# Patient Record
Sex: Female | Born: 1940
Health system: Southern US, Community
[De-identification: ages and names within clinical notes are randomized; demographics above are authoritative.]

## PROBLEM LIST (undated history)

## (undated) DIAGNOSIS — J984 Other disorders of lung: Secondary | ICD-10-CM

## (undated) DIAGNOSIS — H269 Unspecified cataract: Secondary | ICD-10-CM

## (undated) DIAGNOSIS — Z170 Estrogen receptor positive status [ER+]: Principal | ICD-10-CM

## (undated) DIAGNOSIS — C50919 Malignant neoplasm of unspecified site of unspecified female breast: Secondary | ICD-10-CM

## (undated) DIAGNOSIS — C569 Malignant neoplasm of unspecified ovary: Secondary | ICD-10-CM

## (undated) DIAGNOSIS — M1990 Unspecified osteoarthritis, unspecified site: Secondary | ICD-10-CM

## (undated) DIAGNOSIS — J449 Chronic obstructive pulmonary disease, unspecified: Secondary | ICD-10-CM

## (undated) DIAGNOSIS — I7121 Aneurysm of the ascending aorta, without rupture: Secondary | ICD-10-CM

## (undated) DIAGNOSIS — R002 Palpitations: Secondary | ICD-10-CM

## (undated) DIAGNOSIS — R0600 Dyspnea, unspecified: Secondary | ICD-10-CM

## (undated) DIAGNOSIS — I719 Aortic aneurysm of unspecified site, without rupture: Secondary | ICD-10-CM

## (undated) DIAGNOSIS — N644 Mastodynia: Secondary | ICD-10-CM

## (undated) DIAGNOSIS — I10 Essential (primary) hypertension: Secondary | ICD-10-CM

## (undated) DIAGNOSIS — I2510 Atherosclerotic heart disease of native coronary artery without angina pectoris: Secondary | ICD-10-CM

## (undated) DIAGNOSIS — I340 Nonrheumatic mitral (valve) insufficiency: Secondary | ICD-10-CM

## (undated) DIAGNOSIS — G20 Parkinson's disease: Secondary | ICD-10-CM

## (undated) DIAGNOSIS — G8912 Acute post-thoracotomy pain: Secondary | ICD-10-CM

## (undated) DIAGNOSIS — J45909 Unspecified asthma, uncomplicated: Secondary | ICD-10-CM

## (undated) DIAGNOSIS — M810 Age-related osteoporosis without current pathological fracture: Secondary | ICD-10-CM

## (undated) DIAGNOSIS — I509 Heart failure, unspecified: Secondary | ICD-10-CM

## (undated) DIAGNOSIS — K219 Gastro-esophageal reflux disease without esophagitis: Secondary | ICD-10-CM

## (undated) DIAGNOSIS — J151 Pneumonia due to Pseudomonas: Secondary | ICD-10-CM

## (undated) DIAGNOSIS — I5032 Chronic diastolic (congestive) heart failure: Secondary | ICD-10-CM

## (undated) DIAGNOSIS — D259 Leiomyoma of uterus, unspecified: Secondary | ICD-10-CM

## (undated) DIAGNOSIS — Z9889 Other specified postprocedural states: Secondary | ICD-10-CM

## (undated) DIAGNOSIS — N649 Disorder of breast, unspecified: Secondary | ICD-10-CM

## (undated) DIAGNOSIS — E559 Vitamin D deficiency, unspecified: Secondary | ICD-10-CM

## (undated) DIAGNOSIS — I209 Angina pectoris, unspecified: Secondary | ICD-10-CM

## (undated) DIAGNOSIS — K859 Acute pancreatitis without necrosis or infection, unspecified: Secondary | ICD-10-CM

## (undated) HISTORY — DX: Malignant neoplasm of unspecified site of unspecified female breast: C50.919

## (undated) HISTORY — DX: Acute post-thoracotomy pain: G89.12

## (undated) HISTORY — DX: Palpitations: R00.2

## (undated) HISTORY — DX: Disorder of breast, unspecified: N64.9

## (undated) HISTORY — DX: Chronic obstructive pulmonary disease, unspecified: J44.9

## (undated) HISTORY — DX: Malignant neoplasm of unspecified ovary: C56.9

## (undated) HISTORY — DX: Unspecified cataract: H26.9

## (undated) HISTORY — DX: Pneumonia due to Pseudomonas: J15.1

## (undated) HISTORY — DX: Unspecified osteoarthritis, unspecified site: M19.90

## (undated) HISTORY — DX: Age-related osteoporosis without current pathological fracture: M81.0

## (undated) HISTORY — DX: Other disorders of lung: J98.4

## (undated) HISTORY — DX: Mastodynia: N64.4

## (undated) HISTORY — DX: Nonrheumatic mitral (valve) insufficiency: I34.0

## (undated) HISTORY — DX: Aortic aneurysm of unspecified site, without rupture: I71.9

## (undated) HISTORY — DX: Leiomyoma of uterus, unspecified: D25.9

## (undated) HISTORY — DX: Acute pancreatitis without necrosis or infection, unspecified: K85.90

## (undated) HISTORY — DX: Gastro-esophageal reflux disease without esophagitis: K21.9

## (undated) HISTORY — DX: Estrogen receptor positive status (ER+): Z17.0

## (undated) HISTORY — DX: Heart failure, unspecified: I50.9

## (undated) HISTORY — DX: Unspecified asthma, uncomplicated: J45.909

## (undated) HISTORY — DX: Aneurysm of the ascending aorta, without rupture: I71.21

## (undated) HISTORY — PX: ABDOMINAL HYSTERECTOMY: SHX81

## (undated) HISTORY — DX: Vitamin D deficiency, unspecified: E55.9

## (undated) HISTORY — DX: Essential (primary) hypertension: I10

## (undated) HISTORY — DX: Parkinson's disease: G20

## (undated) HISTORY — DX: Hypercalcemia: E83.52

## (undated) HISTORY — DX: Chronic diastolic (congestive) heart failure: I50.32

---
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
## 1990-11-26 DIAGNOSIS — N649 Disorder of breast, unspecified: Secondary | ICD-10-CM

## 1990-11-26 HISTORY — PX: BREAST LUMPECTOMY: SHX2

## 1990-11-26 HISTORY — DX: Disorder of breast, unspecified: N64.9

## 1994-05-26 DIAGNOSIS — N644 Mastodynia: Secondary | ICD-10-CM

## 1994-05-26 HISTORY — DX: Mastodynia: N64.4

## 1998-12-29 ENCOUNTER — Other Ambulatory Visit: Admission: RE | Admit: 1998-12-29 | Discharge: 1998-12-29 | Payer: Self-pay | Admitting: Obstetrics and Gynecology

## 1999-10-11 ENCOUNTER — Encounter: Admission: RE | Admit: 1999-10-11 | Discharge: 1999-10-11 | Payer: Self-pay | Admitting: Internal Medicine

## 1999-10-11 ENCOUNTER — Encounter: Payer: Self-pay | Admitting: Internal Medicine

## 1999-11-27 HISTORY — PX: CHOLECYSTECTOMY: SHX55

## 2000-01-02 ENCOUNTER — Other Ambulatory Visit: Admission: RE | Admit: 2000-01-02 | Discharge: 2000-01-02 | Payer: Self-pay | Admitting: Obstetrics and Gynecology

## 2000-04-03 ENCOUNTER — Other Ambulatory Visit: Admission: RE | Admit: 2000-04-03 | Discharge: 2000-04-03 | Payer: Self-pay | Admitting: Urology

## 2000-05-31 ENCOUNTER — Other Ambulatory Visit: Admission: RE | Admit: 2000-05-31 | Discharge: 2000-05-31 | Payer: Self-pay | Admitting: Obstetrics and Gynecology

## 2000-06-27 ENCOUNTER — Encounter: Admission: RE | Admit: 2000-06-27 | Discharge: 2000-06-27 | Payer: Self-pay | Admitting: Obstetrics and Gynecology

## 2000-06-27 ENCOUNTER — Encounter: Payer: Self-pay | Admitting: Obstetrics and Gynecology

## 2000-10-16 ENCOUNTER — Encounter: Payer: Self-pay | Admitting: Emergency Medicine

## 2000-10-16 ENCOUNTER — Encounter (INDEPENDENT_AMBULATORY_CARE_PROVIDER_SITE_OTHER): Payer: Self-pay | Admitting: Specialist

## 2000-10-16 ENCOUNTER — Inpatient Hospital Stay (HOSPITAL_COMMUNITY): Admission: EM | Admit: 2000-10-16 | Discharge: 2000-10-19 | Payer: Self-pay | Admitting: Emergency Medicine

## 2000-10-18 ENCOUNTER — Encounter: Payer: Self-pay | Admitting: General Surgery

## 2000-11-26 DIAGNOSIS — C50919 Malignant neoplasm of unspecified site of unspecified female breast: Secondary | ICD-10-CM

## 2000-11-26 HISTORY — PX: MASTECTOMY MODIFIED RADICAL: SUR848

## 2000-11-26 HISTORY — DX: Malignant neoplasm of unspecified site of unspecified female breast: C50.919

## 2000-11-26 HISTORY — PX: LUNG BIOPSY: SHX232

## 2000-12-06 ENCOUNTER — Encounter: Payer: Self-pay | Admitting: General Surgery

## 2000-12-09 ENCOUNTER — Encounter (INDEPENDENT_AMBULATORY_CARE_PROVIDER_SITE_OTHER): Payer: Self-pay | Admitting: *Deleted

## 2000-12-09 ENCOUNTER — Ambulatory Visit (HOSPITAL_COMMUNITY): Admission: RE | Admit: 2000-12-09 | Discharge: 2000-12-09 | Payer: Self-pay | Admitting: General Surgery

## 2000-12-24 ENCOUNTER — Encounter: Payer: Self-pay | Admitting: General Surgery

## 2000-12-24 ENCOUNTER — Encounter: Admission: RE | Admit: 2000-12-24 | Discharge: 2000-12-24 | Payer: Self-pay | Admitting: General Surgery

## 2001-01-02 ENCOUNTER — Encounter (INDEPENDENT_AMBULATORY_CARE_PROVIDER_SITE_OTHER): Payer: Self-pay | Admitting: *Deleted

## 2001-01-02 ENCOUNTER — Encounter: Payer: Self-pay | Admitting: General Surgery

## 2001-01-02 ENCOUNTER — Ambulatory Visit (HOSPITAL_COMMUNITY): Admission: RE | Admit: 2001-01-02 | Discharge: 2001-01-03 | Payer: Self-pay | Admitting: General Surgery

## 2001-01-10 ENCOUNTER — Ambulatory Visit (HOSPITAL_COMMUNITY): Admission: RE | Admit: 2001-01-10 | Discharge: 2001-01-10 | Payer: Self-pay | Admitting: Oncology

## 2001-01-10 ENCOUNTER — Encounter: Payer: Self-pay | Admitting: Oncology

## 2001-01-23 ENCOUNTER — Encounter (INDEPENDENT_AMBULATORY_CARE_PROVIDER_SITE_OTHER): Payer: Self-pay | Admitting: *Deleted

## 2001-01-23 ENCOUNTER — Inpatient Hospital Stay (HOSPITAL_COMMUNITY): Admission: RE | Admit: 2001-01-23 | Discharge: 2001-01-27 | Payer: Self-pay | Admitting: Thoracic Surgery

## 2001-01-23 ENCOUNTER — Encounter: Payer: Self-pay | Admitting: Thoracic Surgery

## 2001-01-24 ENCOUNTER — Encounter: Payer: Self-pay | Admitting: Thoracic Surgery

## 2001-01-25 ENCOUNTER — Encounter: Payer: Self-pay | Admitting: Thoracic Surgery

## 2001-01-26 ENCOUNTER — Encounter: Payer: Self-pay | Admitting: Thoracic Surgery

## 2001-02-04 ENCOUNTER — Encounter: Payer: Self-pay | Admitting: Thoracic Surgery

## 2001-02-04 ENCOUNTER — Encounter: Admission: RE | Admit: 2001-02-04 | Discharge: 2001-02-04 | Payer: Self-pay | Admitting: Thoracic Surgery

## 2001-02-10 ENCOUNTER — Encounter: Admission: RE | Admit: 2001-02-10 | Discharge: 2001-02-10 | Payer: Self-pay | Admitting: Internal Medicine

## 2001-02-17 ENCOUNTER — Encounter: Admission: RE | Admit: 2001-02-17 | Discharge: 2001-02-17 | Payer: Self-pay | Admitting: Internal Medicine

## 2001-02-19 ENCOUNTER — Encounter: Payer: Self-pay | Admitting: Thoracic Surgery

## 2001-02-19 ENCOUNTER — Encounter: Admission: RE | Admit: 2001-02-19 | Discharge: 2001-02-19 | Payer: Self-pay | Admitting: Thoracic Surgery

## 2001-03-04 ENCOUNTER — Encounter: Admission: RE | Admit: 2001-03-04 | Discharge: 2001-03-04 | Payer: Self-pay | Admitting: Internal Medicine

## 2001-03-19 ENCOUNTER — Encounter: Admission: RE | Admit: 2001-03-19 | Discharge: 2001-03-19 | Payer: Self-pay | Admitting: Thoracic Surgery

## 2001-03-19 ENCOUNTER — Encounter: Payer: Self-pay | Admitting: Thoracic Surgery

## 2001-03-25 ENCOUNTER — Other Ambulatory Visit: Admission: RE | Admit: 2001-03-25 | Discharge: 2001-03-25 | Payer: Self-pay | Admitting: General Surgery

## 2001-03-28 ENCOUNTER — Other Ambulatory Visit: Admission: RE | Admit: 2001-03-28 | Discharge: 2001-03-28 | Payer: Self-pay | Admitting: Obstetrics and Gynecology

## 2001-04-15 ENCOUNTER — Encounter: Admission: RE | Admit: 2001-04-15 | Discharge: 2001-04-15 | Payer: Self-pay | Admitting: Internal Medicine

## 2001-05-14 ENCOUNTER — Other Ambulatory Visit: Admission: RE | Admit: 2001-05-14 | Discharge: 2001-05-14 | Payer: Self-pay | Admitting: Urology

## 2001-05-20 ENCOUNTER — Encounter: Admission: RE | Admit: 2001-05-20 | Discharge: 2001-05-20 | Payer: Self-pay | Admitting: Thoracic Surgery

## 2001-05-20 ENCOUNTER — Encounter: Payer: Self-pay | Admitting: Thoracic Surgery

## 2001-06-19 ENCOUNTER — Encounter: Admission: RE | Admit: 2001-06-19 | Discharge: 2001-06-19 | Payer: Self-pay | Admitting: General Surgery

## 2001-06-19 ENCOUNTER — Encounter: Payer: Self-pay | Admitting: General Surgery

## 2001-08-27 ENCOUNTER — Encounter: Admission: RE | Admit: 2001-08-27 | Discharge: 2001-08-27 | Payer: Self-pay | Admitting: Thoracic Surgery

## 2001-08-27 ENCOUNTER — Encounter: Payer: Self-pay | Admitting: Thoracic Surgery

## 2001-09-16 ENCOUNTER — Encounter: Admission: RE | Admit: 2001-09-16 | Discharge: 2001-09-16 | Payer: Self-pay | Admitting: Obstetrics & Gynecology

## 2001-09-26 ENCOUNTER — Encounter: Payer: Self-pay | Admitting: Internal Medicine

## 2001-09-26 ENCOUNTER — Encounter: Admission: RE | Admit: 2001-09-26 | Discharge: 2001-09-26 | Payer: Self-pay | Admitting: Internal Medicine

## 2001-10-28 ENCOUNTER — Encounter: Admission: RE | Admit: 2001-10-28 | Discharge: 2001-10-28 | Payer: Self-pay | Admitting: Internal Medicine

## 2001-12-09 ENCOUNTER — Encounter: Admission: RE | Admit: 2001-12-09 | Discharge: 2001-12-09 | Payer: Self-pay | Admitting: Oncology

## 2001-12-09 ENCOUNTER — Encounter: Payer: Self-pay | Admitting: Oncology

## 2002-01-14 ENCOUNTER — Encounter: Payer: Self-pay | Admitting: Oncology

## 2002-01-14 ENCOUNTER — Encounter: Admission: RE | Admit: 2002-01-14 | Discharge: 2002-01-14 | Payer: Self-pay | Admitting: Oncology

## 2002-02-06 ENCOUNTER — Encounter: Admission: RE | Admit: 2002-02-06 | Discharge: 2002-02-06 | Payer: Self-pay | Admitting: Internal Medicine

## 2002-03-31 ENCOUNTER — Encounter: Admission: RE | Admit: 2002-03-31 | Discharge: 2002-03-31 | Payer: Self-pay | Admitting: Internal Medicine

## 2002-04-14 ENCOUNTER — Encounter: Payer: Self-pay | Admitting: Internal Medicine

## 2002-04-14 ENCOUNTER — Encounter: Admission: RE | Admit: 2002-04-14 | Discharge: 2002-04-14 | Payer: Self-pay | Admitting: Internal Medicine

## 2002-04-28 ENCOUNTER — Encounter: Payer: Self-pay | Admitting: Internal Medicine

## 2002-04-28 ENCOUNTER — Encounter: Admission: RE | Admit: 2002-04-28 | Discharge: 2002-04-28 | Payer: Self-pay | Admitting: *Deleted

## 2002-05-12 ENCOUNTER — Encounter: Admission: RE | Admit: 2002-05-12 | Discharge: 2002-05-12 | Payer: Self-pay | Admitting: Internal Medicine

## 2002-09-14 ENCOUNTER — Other Ambulatory Visit: Admission: RE | Admit: 2002-09-14 | Discharge: 2002-09-14 | Payer: Self-pay | Admitting: Obstetrics and Gynecology

## 2002-11-26 DIAGNOSIS — D3911 Neoplasm of uncertain behavior of right ovary: Secondary | ICD-10-CM

## 2002-11-26 HISTORY — PX: APPENDECTOMY: SHX54

## 2002-11-26 HISTORY — DX: Neoplasm of uncertain behavior of right ovary: D39.11

## 2002-12-10 ENCOUNTER — Encounter: Admission: RE | Admit: 2002-12-10 | Discharge: 2002-12-10 | Payer: Self-pay | Admitting: Oncology

## 2002-12-10 ENCOUNTER — Encounter: Payer: Self-pay | Admitting: Oncology

## 2003-01-04 ENCOUNTER — Encounter: Payer: Self-pay | Admitting: Oncology

## 2003-01-04 ENCOUNTER — Encounter: Admission: RE | Admit: 2003-01-04 | Discharge: 2003-01-04 | Payer: Self-pay | Admitting: Oncology

## 2003-04-14 ENCOUNTER — Encounter (INDEPENDENT_AMBULATORY_CARE_PROVIDER_SITE_OTHER): Payer: Self-pay | Admitting: *Deleted

## 2003-04-14 ENCOUNTER — Inpatient Hospital Stay (HOSPITAL_COMMUNITY): Admission: RE | Admit: 2003-04-14 | Discharge: 2003-04-16 | Payer: Self-pay | Admitting: Interventional Cardiology

## 2003-05-25 ENCOUNTER — Ambulatory Visit: Admission: RE | Admit: 2003-05-25 | Discharge: 2003-05-25 | Payer: Self-pay | Admitting: Gynecology

## 2003-07-05 ENCOUNTER — Encounter: Payer: Self-pay | Admitting: Oncology

## 2003-07-05 ENCOUNTER — Encounter: Admission: RE | Admit: 2003-07-05 | Discharge: 2003-07-05 | Payer: Self-pay | Admitting: Oncology

## 2003-07-12 ENCOUNTER — Encounter: Admission: RE | Admit: 2003-07-12 | Discharge: 2003-07-12 | Payer: Self-pay | Admitting: Internal Medicine

## 2003-07-27 ENCOUNTER — Encounter: Admission: RE | Admit: 2003-07-27 | Discharge: 2003-07-27 | Payer: Self-pay | Admitting: Internal Medicine

## 2003-08-13 ENCOUNTER — Encounter: Admission: RE | Admit: 2003-08-13 | Discharge: 2003-08-13 | Payer: Self-pay | Admitting: Internal Medicine

## 2003-10-05 ENCOUNTER — Encounter: Admission: RE | Admit: 2003-10-05 | Discharge: 2003-10-05 | Payer: Self-pay | Admitting: Internal Medicine

## 2003-12-02 ENCOUNTER — Ambulatory Visit (HOSPITAL_COMMUNITY): Admission: RE | Admit: 2003-12-02 | Discharge: 2003-12-02 | Payer: Self-pay | Admitting: Internal Medicine

## 2003-12-13 ENCOUNTER — Encounter: Admission: RE | Admit: 2003-12-13 | Discharge: 2003-12-13 | Payer: Self-pay | Admitting: Internal Medicine

## 2003-12-21 ENCOUNTER — Encounter: Admission: RE | Admit: 2003-12-21 | Discharge: 2003-12-21 | Payer: Self-pay | Admitting: Oncology

## 2004-02-15 ENCOUNTER — Encounter: Admission: RE | Admit: 2004-02-15 | Discharge: 2004-02-15 | Payer: Self-pay | Admitting: Internal Medicine

## 2004-02-22 ENCOUNTER — Ambulatory Visit (HOSPITAL_COMMUNITY): Admission: RE | Admit: 2004-02-22 | Discharge: 2004-02-22 | Payer: Self-pay | Admitting: Internal Medicine

## 2004-03-21 ENCOUNTER — Encounter: Admission: RE | Admit: 2004-03-21 | Discharge: 2004-03-21 | Payer: Self-pay | Admitting: Internal Medicine

## 2004-08-01 ENCOUNTER — Encounter: Admission: RE | Admit: 2004-08-01 | Discharge: 2004-08-01 | Payer: Self-pay | Admitting: General Surgery

## 2004-08-03 ENCOUNTER — Encounter (INDEPENDENT_AMBULATORY_CARE_PROVIDER_SITE_OTHER): Payer: Self-pay | Admitting: Specialist

## 2004-08-03 ENCOUNTER — Ambulatory Visit (HOSPITAL_BASED_OUTPATIENT_CLINIC_OR_DEPARTMENT_OTHER): Admission: RE | Admit: 2004-08-03 | Discharge: 2004-08-03 | Payer: Self-pay | Admitting: General Surgery

## 2004-08-03 ENCOUNTER — Ambulatory Visit (HOSPITAL_COMMUNITY): Admission: RE | Admit: 2004-08-03 | Discharge: 2004-08-03 | Payer: Self-pay | Admitting: General Surgery

## 2004-08-07 ENCOUNTER — Encounter: Admission: RE | Admit: 2004-08-07 | Discharge: 2004-08-07 | Payer: Self-pay | Admitting: Oncology

## 2004-08-26 HISTORY — PX: BREAST BIOPSY: SHX20

## 2004-12-04 ENCOUNTER — Ambulatory Visit: Payer: Self-pay | Admitting: Oncology

## 2004-12-27 ENCOUNTER — Encounter: Admission: RE | Admit: 2004-12-27 | Discharge: 2004-12-27 | Payer: Self-pay | Admitting: *Deleted

## 2005-02-05 ENCOUNTER — Ambulatory Visit: Payer: Self-pay | Admitting: Internal Medicine

## 2005-02-05 ENCOUNTER — Encounter: Admission: RE | Admit: 2005-02-05 | Discharge: 2005-02-05 | Payer: Self-pay | Admitting: Oncology

## 2005-02-21 ENCOUNTER — Ambulatory Visit: Payer: Self-pay | Admitting: Internal Medicine

## 2005-05-22 ENCOUNTER — Ambulatory Visit: Payer: Self-pay | Admitting: Internal Medicine

## 2005-06-01 ENCOUNTER — Ambulatory Visit: Payer: Self-pay | Admitting: Oncology

## 2005-06-20 ENCOUNTER — Encounter: Admission: RE | Admit: 2005-06-20 | Discharge: 2005-06-20 | Payer: Self-pay | Admitting: Oncology

## 2005-09-06 ENCOUNTER — Ambulatory Visit: Payer: Self-pay | Admitting: Internal Medicine

## 2005-10-24 ENCOUNTER — Ambulatory Visit: Payer: Self-pay | Admitting: Internal Medicine

## 2005-11-26 HISTORY — PX: CARDIAC CATHETERIZATION: SHX172

## 2005-12-07 ENCOUNTER — Ambulatory Visit: Payer: Self-pay | Admitting: Oncology

## 2005-12-25 ENCOUNTER — Encounter: Admission: RE | Admit: 2005-12-25 | Discharge: 2005-12-25 | Payer: Self-pay | Admitting: Oncology

## 2006-01-02 ENCOUNTER — Encounter: Admission: RE | Admit: 2006-01-02 | Discharge: 2006-01-02 | Payer: Self-pay | Admitting: Oncology

## 2006-05-22 ENCOUNTER — Other Ambulatory Visit: Admission: RE | Admit: 2006-05-22 | Discharge: 2006-05-22 | Payer: Self-pay | Admitting: Obstetrics & Gynecology

## 2006-05-27 ENCOUNTER — Ambulatory Visit: Payer: Self-pay | Admitting: Internal Medicine

## 2006-06-04 ENCOUNTER — Ambulatory Visit: Payer: Self-pay | Admitting: Oncology

## 2006-06-10 LAB — COMPREHENSIVE METABOLIC PANEL
ALT: 17 U/L (ref 0–40)
Albumin: 4.3 g/dL (ref 3.5–5.2)
Alkaline Phosphatase: 73 U/L (ref 39–117)
CO2: 25 mEq/L (ref 19–32)
Potassium: 4.2 mEq/L (ref 3.5–5.3)
Sodium: 138 mEq/L (ref 135–145)
Total Bilirubin: 0.9 mg/dL (ref 0.3–1.2)
Total Protein: 6.9 g/dL (ref 6.0–8.3)

## 2006-06-10 LAB — CBC WITH DIFFERENTIAL/PLATELET
BASO%: 0.3 % (ref 0.0–2.0)
EOS%: 3.4 % (ref 0.0–7.0)
HCT: 39.7 % (ref 34.8–46.6)
MCH: 30.3 pg (ref 26.0–34.0)
MCHC: 33.8 g/dL (ref 32.0–36.0)
MONO#: 0.6 10*3/uL (ref 0.1–0.9)
NEUT%: 61.7 % (ref 39.6–76.8)
RBC: 4.42 10*6/uL (ref 3.70–5.32)
WBC: 6.4 10*3/uL (ref 3.9–10.0)
lymph#: 1.6 10*3/uL (ref 0.9–3.3)

## 2006-06-17 ENCOUNTER — Ambulatory Visit: Payer: Self-pay | Admitting: Internal Medicine

## 2006-06-26 ENCOUNTER — Ambulatory Visit: Payer: Self-pay | Admitting: Internal Medicine

## 2006-07-01 ENCOUNTER — Ambulatory Visit: Payer: Self-pay | Admitting: Internal Medicine

## 2006-08-05 ENCOUNTER — Encounter: Admission: RE | Admit: 2006-08-05 | Discharge: 2006-08-05 | Payer: Self-pay | Admitting: Oncology

## 2006-08-09 ENCOUNTER — Ambulatory Visit: Payer: Self-pay | Admitting: Internal Medicine

## 2006-08-09 ENCOUNTER — Inpatient Hospital Stay (HOSPITAL_COMMUNITY): Admission: EM | Admit: 2006-08-09 | Discharge: 2006-08-13 | Payer: Self-pay | Admitting: Emergency Medicine

## 2006-08-12 ENCOUNTER — Encounter (INDEPENDENT_AMBULATORY_CARE_PROVIDER_SITE_OTHER): Payer: Self-pay | Admitting: Cardiology

## 2006-08-19 ENCOUNTER — Ambulatory Visit: Payer: Self-pay | Admitting: Internal Medicine

## 2006-09-09 ENCOUNTER — Ambulatory Visit (HOSPITAL_COMMUNITY): Admission: RE | Admit: 2006-09-09 | Discharge: 2006-09-09 | Payer: Self-pay | Admitting: Internal Medicine

## 2006-09-11 ENCOUNTER — Ambulatory Visit: Payer: Self-pay | Admitting: Internal Medicine

## 2006-11-18 ENCOUNTER — Ambulatory Visit: Payer: Self-pay | Admitting: Internal Medicine

## 2006-12-04 ENCOUNTER — Ambulatory Visit: Payer: Self-pay | Admitting: Oncology

## 2006-12-09 LAB — COMPREHENSIVE METABOLIC PANEL
ALT: 21 U/L (ref 0–35)
AST: 23 U/L (ref 0–37)
Alkaline Phosphatase: 69 U/L (ref 39–117)
Creatinine, Ser: 0.79 mg/dL (ref 0.40–1.20)
Total Bilirubin: 1 mg/dL (ref 0.3–1.2)

## 2006-12-09 LAB — CBC WITH DIFFERENTIAL/PLATELET
BASO%: 1 % (ref 0.0–2.0)
EOS%: 3.1 % (ref 0.0–7.0)
HCT: 40.5 % (ref 34.8–46.6)
LYMPH%: 27.1 % (ref 14.0–48.0)
MCH: 31 pg (ref 26.0–34.0)
MCHC: 34 g/dL (ref 32.0–36.0)
MONO%: 9.6 % (ref 0.0–13.0)
NEUT%: 59.2 % (ref 39.6–76.8)
Platelets: 206 10*3/uL (ref 145–400)

## 2006-12-09 LAB — LACTATE DEHYDROGENASE: LDH: 124 U/L (ref 94–250)

## 2007-01-22 ENCOUNTER — Encounter: Admission: RE | Admit: 2007-01-22 | Discharge: 2007-01-22 | Payer: Self-pay | Admitting: Oncology

## 2007-02-10 DIAGNOSIS — E782 Mixed hyperlipidemia: Secondary | ICD-10-CM | POA: Insufficient documentation

## 2007-02-10 DIAGNOSIS — I10 Essential (primary) hypertension: Secondary | ICD-10-CM | POA: Insufficient documentation

## 2007-03-06 ENCOUNTER — Ambulatory Visit: Payer: Self-pay | Admitting: Oncology

## 2007-03-11 LAB — COMPREHENSIVE METABOLIC PANEL
AST: 16 U/L (ref 0–37)
Albumin: 4.2 g/dL (ref 3.5–5.2)
Alkaline Phosphatase: 62 U/L (ref 39–117)
CO2: 28 mEq/L (ref 19–32)
Chloride: 104 mEq/L (ref 96–112)
Glucose, Bld: 101 mg/dL — ABNORMAL HIGH (ref 70–99)
Total Bilirubin: 0.9 mg/dL (ref 0.3–1.2)
Total Protein: 6.6 g/dL (ref 6.0–8.3)

## 2007-03-11 LAB — ERYTHROCYTE SEDIMENTATION RATE: Sed Rate: 10 mm/hr (ref 0–30)

## 2007-03-11 LAB — CBC WITH DIFFERENTIAL/PLATELET
Basophils Absolute: 0 10*3/uL (ref 0.0–0.1)
EOS%: 4 % (ref 0.0–7.0)
HCT: 37.7 % (ref 34.8–46.6)
HGB: 13.3 g/dL (ref 11.6–15.9)
MCH: 32 pg (ref 26.0–34.0)
MCV: 90.6 fL (ref 81.0–101.0)
NEUT%: 64.6 % (ref 39.6–76.8)
lymph#: 1.2 10*3/uL (ref 0.9–3.3)

## 2007-04-10 ENCOUNTER — Encounter: Payer: Self-pay | Admitting: Internal Medicine

## 2007-04-10 ENCOUNTER — Encounter: Admission: RE | Admit: 2007-04-10 | Discharge: 2007-04-10 | Payer: Self-pay | Admitting: Oncology

## 2007-05-05 ENCOUNTER — Ambulatory Visit: Payer: Self-pay | Admitting: Internal Medicine

## 2007-05-05 ENCOUNTER — Encounter: Payer: Self-pay | Admitting: Internal Medicine

## 2007-05-26 ENCOUNTER — Ambulatory Visit: Payer: Self-pay | Admitting: Internal Medicine

## 2007-06-10 ENCOUNTER — Ambulatory Visit: Payer: Self-pay | Admitting: Internal Medicine

## 2007-07-28 DIAGNOSIS — J449 Chronic obstructive pulmonary disease, unspecified: Secondary | ICD-10-CM

## 2007-07-28 HISTORY — DX: Chronic obstructive pulmonary disease, unspecified: J44.9

## 2007-08-06 ENCOUNTER — Telehealth: Payer: Self-pay | Admitting: Internal Medicine

## 2007-08-20 ENCOUNTER — Encounter: Admission: RE | Admit: 2007-08-20 | Discharge: 2007-08-20 | Payer: Self-pay | Admitting: Oncology

## 2007-09-09 ENCOUNTER — Ambulatory Visit: Payer: Self-pay | Admitting: Internal Medicine

## 2007-09-09 ENCOUNTER — Ambulatory Visit: Payer: Self-pay | Admitting: Oncology

## 2007-09-15 ENCOUNTER — Encounter: Payer: Self-pay | Admitting: Internal Medicine

## 2007-09-15 LAB — CBC WITH DIFFERENTIAL/PLATELET
Eosinophils Absolute: 0.2 10*3/uL (ref 0.0–0.5)
HCT: 39.6 % (ref 34.8–46.6)
LYMPH%: 18.8 % (ref 14.0–48.0)
MCHC: 34.4 g/dL (ref 32.0–36.0)
MCV: 91.6 fL (ref 81.0–101.0)
MONO#: 0.6 10*3/uL (ref 0.1–0.9)
MONO%: 10.4 % (ref 0.0–13.0)
NEUT#: 3.7 10*3/uL (ref 1.5–6.5)
NEUT%: 66.6 % (ref 39.6–76.8)
Platelets: 209 10*3/uL (ref 145–400)
WBC: 5.6 10*3/uL (ref 3.9–10.0)

## 2007-09-15 LAB — COMPREHENSIVE METABOLIC PANEL
Alkaline Phosphatase: 68 U/L (ref 39–117)
BUN: 19 mg/dL (ref 6–23)
CO2: 29 mEq/L (ref 19–32)
Creatinine, Ser: 0.99 mg/dL (ref 0.40–1.20)
Glucose, Bld: 91 mg/dL (ref 70–99)
Total Bilirubin: 1 mg/dL (ref 0.3–1.2)

## 2007-09-15 LAB — LACTATE DEHYDROGENASE: LDH: 119 U/L (ref 94–250)

## 2007-09-17 ENCOUNTER — Encounter: Admission: RE | Admit: 2007-09-17 | Discharge: 2007-09-17 | Payer: Self-pay | Admitting: Oncology

## 2007-09-17 ENCOUNTER — Encounter: Payer: Self-pay | Admitting: Internal Medicine

## 2007-09-24 ENCOUNTER — Encounter: Payer: Self-pay | Admitting: Internal Medicine

## 2007-10-15 ENCOUNTER — Encounter: Payer: Self-pay | Admitting: Internal Medicine

## 2008-03-09 ENCOUNTER — Ambulatory Visit: Payer: Self-pay | Admitting: Internal Medicine

## 2008-03-10 ENCOUNTER — Encounter (INDEPENDENT_AMBULATORY_CARE_PROVIDER_SITE_OTHER): Payer: Self-pay | Admitting: *Deleted

## 2008-03-10 ENCOUNTER — Ambulatory Visit: Payer: Self-pay | Admitting: Oncology

## 2008-03-10 DIAGNOSIS — Z853 Personal history of malignant neoplasm of breast: Secondary | ICD-10-CM | POA: Insufficient documentation

## 2008-03-15 ENCOUNTER — Encounter: Payer: Self-pay | Admitting: Internal Medicine

## 2008-03-15 LAB — COMPREHENSIVE METABOLIC PANEL
ALT: 22 U/L (ref 0–35)
Albumin: 4.1 g/dL (ref 3.5–5.2)
CO2: 24 mEq/L (ref 19–32)
Calcium: 9.3 mg/dL (ref 8.4–10.5)
Chloride: 104 mEq/L (ref 96–112)
Glucose, Bld: 89 mg/dL (ref 70–99)
Potassium: 4.3 mEq/L (ref 3.5–5.3)
Sodium: 138 mEq/L (ref 135–145)
Total Protein: 6.8 g/dL (ref 6.0–8.3)

## 2008-03-15 LAB — CBC WITH DIFFERENTIAL/PLATELET
BASO%: 0.9 % (ref 0.0–2.0)
Eosinophils Absolute: 0.2 10*3/uL (ref 0.0–0.5)
LYMPH%: 27.3 % (ref 14.0–48.0)
MCHC: 34.5 g/dL (ref 32.0–36.0)
MONO#: 0.4 10*3/uL (ref 0.1–0.9)
NEUT#: 3.3 10*3/uL (ref 1.5–6.5)
Platelets: 210 10*3/uL (ref 145–400)
RBC: 4.16 10*6/uL (ref 3.70–5.32)
RDW: 13.4 % (ref 11.3–14.5)
WBC: 5.5 10*3/uL (ref 3.9–10.0)
lymph#: 1.5 10*3/uL (ref 0.9–3.3)

## 2008-03-15 LAB — LACTATE DEHYDROGENASE: LDH: 116 U/L (ref 94–250)

## 2008-03-16 ENCOUNTER — Encounter: Payer: Self-pay | Admitting: Internal Medicine

## 2008-03-16 ENCOUNTER — Encounter: Admission: RE | Admit: 2008-03-16 | Discharge: 2008-03-16 | Payer: Self-pay | Admitting: Oncology

## 2008-03-25 ENCOUNTER — Ambulatory Visit: Payer: Self-pay | Admitting: Pulmonary Disease

## 2008-03-25 DIAGNOSIS — J449 Chronic obstructive pulmonary disease, unspecified: Secondary | ICD-10-CM | POA: Insufficient documentation

## 2008-03-25 DIAGNOSIS — J309 Allergic rhinitis, unspecified: Secondary | ICD-10-CM | POA: Insufficient documentation

## 2008-03-26 DIAGNOSIS — J151 Pneumonia due to Pseudomonas: Secondary | ICD-10-CM

## 2008-03-26 HISTORY — DX: Pneumonia due to Pseudomonas: J15.1

## 2008-04-01 ENCOUNTER — Encounter: Payer: Self-pay | Admitting: Pulmonary Disease

## 2008-05-13 ENCOUNTER — Ambulatory Visit: Payer: Self-pay | Admitting: Pulmonary Disease

## 2008-05-13 ENCOUNTER — Telehealth (INDEPENDENT_AMBULATORY_CARE_PROVIDER_SITE_OTHER): Payer: Self-pay | Admitting: *Deleted

## 2008-08-09 ENCOUNTER — Encounter (INDEPENDENT_AMBULATORY_CARE_PROVIDER_SITE_OTHER): Payer: Self-pay | Admitting: *Deleted

## 2008-08-23 ENCOUNTER — Ambulatory Visit: Payer: Self-pay | Admitting: Oncology

## 2008-08-23 ENCOUNTER — Encounter: Admission: RE | Admit: 2008-08-23 | Discharge: 2008-08-23 | Payer: Self-pay | Admitting: Oncology

## 2008-08-25 ENCOUNTER — Encounter: Payer: Self-pay | Admitting: Internal Medicine

## 2008-08-25 ENCOUNTER — Ambulatory Visit (HOSPITAL_COMMUNITY): Admission: RE | Admit: 2008-08-25 | Discharge: 2008-08-25 | Payer: Self-pay | Admitting: Oncology

## 2008-08-25 LAB — COMPREHENSIVE METABOLIC PANEL
ALT: 17 U/L (ref 0–35)
AST: 24 U/L (ref 0–37)
Albumin: 4.5 g/dL (ref 3.5–5.2)
BUN: 20 mg/dL (ref 6–23)
CO2: 26 mEq/L (ref 19–32)
Calcium: 9.6 mg/dL (ref 8.4–10.5)
Chloride: 103 mEq/L (ref 96–112)
Potassium: 4.5 mEq/L (ref 3.5–5.3)

## 2008-08-25 LAB — CBC WITH DIFFERENTIAL/PLATELET
BASO%: 0.9 % (ref 0.0–2.0)
Basophils Absolute: 0 10*3/uL (ref 0.0–0.1)
EOS%: 6.9 % (ref 0.0–7.0)
HGB: 14.2 g/dL (ref 11.6–15.9)
MCH: 31.6 pg (ref 26.0–34.0)
MONO#: 0.5 10*3/uL (ref 0.1–0.9)
RDW: 13.7 % (ref 11.3–14.5)
WBC: 4.5 10*3/uL (ref 3.9–10.0)
lymph#: 1.1 10*3/uL (ref 0.9–3.3)

## 2008-08-25 LAB — LACTATE DEHYDROGENASE: LDH: 141 U/L (ref 94–250)

## 2008-10-06 ENCOUNTER — Other Ambulatory Visit: Admission: RE | Admit: 2008-10-06 | Discharge: 2008-10-06 | Payer: Self-pay | Admitting: Obstetrics & Gynecology

## 2008-12-06 ENCOUNTER — Ambulatory Visit: Payer: Self-pay | Admitting: Internal Medicine

## 2008-12-06 DIAGNOSIS — R7309 Other abnormal glucose: Secondary | ICD-10-CM | POA: Insufficient documentation

## 2008-12-07 ENCOUNTER — Encounter (INDEPENDENT_AMBULATORY_CARE_PROVIDER_SITE_OTHER): Payer: Self-pay | Admitting: *Deleted

## 2009-01-24 ENCOUNTER — Encounter: Admission: RE | Admit: 2009-01-24 | Discharge: 2009-01-24 | Payer: Self-pay | Admitting: Oncology

## 2009-02-16 ENCOUNTER — Encounter: Admission: RE | Admit: 2009-02-16 | Discharge: 2009-02-16 | Payer: Self-pay | Admitting: Anesthesiology

## 2009-02-17 ENCOUNTER — Ambulatory Visit: Payer: Self-pay | Admitting: Oncology

## 2009-02-21 ENCOUNTER — Encounter: Payer: Self-pay | Admitting: Internal Medicine

## 2009-02-21 LAB — CBC WITH DIFFERENTIAL/PLATELET
Basophils Absolute: 0 10*3/uL (ref 0.0–0.1)
Eosinophils Absolute: 0.4 10*3/uL (ref 0.0–0.5)
HCT: 41.2 % (ref 34.8–46.6)
HGB: 13.3 g/dL (ref 11.6–15.9)
LYMPH%: 25.9 % (ref 14.0–49.7)
MONO#: 0.4 10*3/uL (ref 0.1–0.9)
NEUT%: 60.5 % (ref 38.4–76.8)
Platelets: 206 10*3/uL (ref 145–400)
WBC: 6.2 10*3/uL (ref 3.9–10.3)
lymph#: 1.6 10*3/uL (ref 0.9–3.3)

## 2009-02-21 LAB — COMPREHENSIVE METABOLIC PANEL
ALT: 14 U/L (ref 0–35)
BUN: 20 mg/dL (ref 6–23)
CO2: 26 mEq/L (ref 19–32)
Calcium: 9.7 mg/dL (ref 8.4–10.5)
Chloride: 102 mEq/L (ref 96–112)
Creatinine, Ser: 0.84 mg/dL (ref 0.40–1.20)
Glucose, Bld: 135 mg/dL — ABNORMAL HIGH (ref 70–99)
Total Bilirubin: 0.7 mg/dL (ref 0.3–1.2)

## 2009-02-21 LAB — LACTATE DEHYDROGENASE: LDH: 120 U/L (ref 94–250)

## 2009-02-23 ENCOUNTER — Encounter: Admission: RE | Admit: 2009-02-23 | Discharge: 2009-02-23 | Payer: Self-pay | Admitting: Oncology

## 2009-02-23 ENCOUNTER — Encounter: Payer: Self-pay | Admitting: Internal Medicine

## 2009-03-01 ENCOUNTER — Encounter: Payer: Self-pay | Admitting: Internal Medicine

## 2009-03-21 LAB — BASIC METABOLIC PANEL
Calcium: 9.9 mg/dL (ref 8.4–10.5)
Glucose, Bld: 105 mg/dL — ABNORMAL HIGH (ref 70–99)
Potassium: 4 mEq/L (ref 3.5–5.3)
Sodium: 140 mEq/L (ref 135–145)

## 2009-06-01 ENCOUNTER — Telehealth (INDEPENDENT_AMBULATORY_CARE_PROVIDER_SITE_OTHER): Payer: Self-pay | Admitting: *Deleted

## 2009-06-03 ENCOUNTER — Telehealth (INDEPENDENT_AMBULATORY_CARE_PROVIDER_SITE_OTHER): Payer: Self-pay | Admitting: *Deleted

## 2009-06-06 ENCOUNTER — Encounter: Payer: Self-pay | Admitting: Internal Medicine

## 2009-08-02 ENCOUNTER — Encounter (INDEPENDENT_AMBULATORY_CARE_PROVIDER_SITE_OTHER): Payer: Self-pay | Admitting: *Deleted

## 2009-08-15 ENCOUNTER — Ambulatory Visit: Payer: Self-pay | Admitting: Internal Medicine

## 2009-08-15 ENCOUNTER — Encounter: Payer: Self-pay | Admitting: Internal Medicine

## 2009-08-15 DIAGNOSIS — M479 Spondylosis, unspecified: Secondary | ICD-10-CM | POA: Insufficient documentation

## 2009-08-18 ENCOUNTER — Ambulatory Visit: Payer: Self-pay | Admitting: Oncology

## 2009-08-23 ENCOUNTER — Ambulatory Visit (HOSPITAL_COMMUNITY): Admission: RE | Admit: 2009-08-23 | Discharge: 2009-08-23 | Payer: Self-pay | Admitting: Oncology

## 2009-08-23 ENCOUNTER — Encounter (INDEPENDENT_AMBULATORY_CARE_PROVIDER_SITE_OTHER): Payer: Self-pay | Admitting: *Deleted

## 2009-08-23 LAB — CONVERTED CEMR LAB
Alkaline Phosphatase: 73 units/L (ref 39–117)
Bilirubin, Direct: 0.1 mg/dL (ref 0.0–0.3)
Calcium: 10.2 mg/dL (ref 8.4–10.5)
Direct LDL: 135 mg/dL
GFR calc non Af Amer: 76.18 mL/min (ref >60)
Glucose, Bld: 73 mg/dL (ref 70–99)
HDL: 47.2 mg/dL (ref >39.00)
Potassium: 4.8 meq/L (ref 3.5–5.1)
Sodium: 142 meq/L (ref 135–145)
Total Bilirubin: 0.9 mg/dL (ref 0.3–1.2)
Total CHOL/HDL Ratio: 4
Total Protein: 7.8 g/dL (ref 6.0–8.3)
Triglycerides: 107 mg/dL (ref 0.0–149.0)
VLDL: 21.4 mg/dL (ref 0.0–40.0)

## 2009-08-23 LAB — CBC WITH DIFFERENTIAL/PLATELET
Basophils Absolute: 0 10*3/uL (ref 0.0–0.1)
EOS%: 4 % (ref 0.0–7.0)
Eosinophils Absolute: 0.2 10*3/uL (ref 0.0–0.5)
HCT: 39.6 % (ref 34.8–46.6)
HGB: 13.4 g/dL (ref 11.6–15.9)
MONO#: 0.5 10*3/uL (ref 0.1–0.9)
NEUT#: 3.2 10*3/uL (ref 1.5–6.5)
NEUT%: 65.9 % (ref 38.4–76.8)
RDW: 13.9 % (ref 11.2–14.5)
WBC: 4.9 10*3/uL (ref 3.9–10.3)
lymph#: 1 10*3/uL (ref 0.9–3.3)

## 2009-08-23 LAB — COMPREHENSIVE METABOLIC PANEL
AST: 20 U/L (ref 0–37)
Albumin: 4.3 g/dL (ref 3.5–5.2)
BUN: 19 mg/dL (ref 6–23)
CO2: 24 mEq/L (ref 19–32)
Calcium: 9.9 mg/dL (ref 8.4–10.5)
Chloride: 105 mEq/L (ref 96–112)
Creatinine, Ser: 0.78 mg/dL (ref 0.40–1.20)
Glucose, Bld: 91 mg/dL (ref 70–99)
Potassium: 4.5 mEq/L (ref 3.5–5.3)

## 2009-08-23 LAB — LACTATE DEHYDROGENASE: LDH: 119 U/L (ref 94–250)

## 2009-08-24 ENCOUNTER — Encounter (INDEPENDENT_AMBULATORY_CARE_PROVIDER_SITE_OTHER): Payer: Self-pay | Admitting: *Deleted

## 2009-08-30 ENCOUNTER — Encounter: Payer: Self-pay | Admitting: Internal Medicine

## 2009-08-31 ENCOUNTER — Encounter: Admission: RE | Admit: 2009-08-31 | Discharge: 2009-10-17 | Payer: Self-pay | Admitting: Internal Medicine

## 2009-09-02 ENCOUNTER — Encounter: Payer: Self-pay | Admitting: Internal Medicine

## 2009-09-12 ENCOUNTER — Encounter: Admission: RE | Admit: 2009-09-12 | Discharge: 2009-09-12 | Payer: Self-pay | Admitting: Oncology

## 2009-09-19 ENCOUNTER — Ambulatory Visit: Payer: Self-pay | Admitting: Oncology

## 2009-09-27 ENCOUNTER — Ambulatory Visit: Payer: Self-pay | Admitting: Internal Medicine

## 2009-09-27 ENCOUNTER — Telehealth (INDEPENDENT_AMBULATORY_CARE_PROVIDER_SITE_OTHER): Payer: Self-pay | Admitting: *Deleted

## 2009-09-27 ENCOUNTER — Encounter (INDEPENDENT_AMBULATORY_CARE_PROVIDER_SITE_OTHER): Payer: Self-pay | Admitting: *Deleted

## 2009-09-27 DIAGNOSIS — R091 Pleurisy: Secondary | ICD-10-CM | POA: Insufficient documentation

## 2009-09-27 DIAGNOSIS — J189 Pneumonia, unspecified organism: Secondary | ICD-10-CM | POA: Insufficient documentation

## 2009-10-13 ENCOUNTER — Encounter: Payer: Self-pay | Admitting: Internal Medicine

## 2009-10-17 ENCOUNTER — Encounter: Payer: Self-pay | Admitting: Internal Medicine

## 2010-02-15 ENCOUNTER — Ambulatory Visit: Payer: Self-pay | Admitting: Oncology

## 2010-02-17 ENCOUNTER — Encounter: Payer: Self-pay | Admitting: Internal Medicine

## 2010-02-17 LAB — CBC WITH DIFFERENTIAL/PLATELET
Basophils Absolute: 0 10*3/uL (ref 0.0–0.1)
Eosinophils Absolute: 0.4 10*3/uL (ref 0.0–0.5)
HCT: 40.9 % (ref 34.8–46.6)
HGB: 13.8 g/dL (ref 11.6–15.9)
LYMPH%: 25.8 % (ref 14.0–49.7)
MCH: 31.9 pg (ref 25.1–34.0)
MCV: 94.6 fL (ref 79.5–101.0)
MONO%: 9 % (ref 0.0–14.0)
NEUT#: 4.1 10*3/uL (ref 1.5–6.5)
NEUT%: 59.1 % (ref 38.4–76.8)
Platelets: 191 10*3/uL (ref 145–400)

## 2010-02-17 LAB — COMPREHENSIVE METABOLIC PANEL
Albumin: 4.6 g/dL (ref 3.5–5.2)
Alkaline Phosphatase: 81 U/L (ref 39–117)
BUN: 20 mg/dL (ref 6–23)
Creatinine, Ser: 0.83 mg/dL (ref 0.40–1.20)
Glucose, Bld: 88 mg/dL (ref 70–99)
Potassium: 4.9 mEq/L (ref 3.5–5.3)
Total Bilirubin: 0.9 mg/dL (ref 0.3–1.2)

## 2010-02-21 ENCOUNTER — Encounter: Admission: RE | Admit: 2010-02-21 | Discharge: 2010-02-21 | Payer: Self-pay | Admitting: Oncology

## 2010-02-28 ENCOUNTER — Encounter: Payer: Self-pay | Admitting: Internal Medicine

## 2010-08-15 ENCOUNTER — Ambulatory Visit: Payer: Self-pay | Admitting: Internal Medicine

## 2010-09-01 ENCOUNTER — Ambulatory Visit: Payer: Self-pay | Admitting: Oncology

## 2010-09-15 ENCOUNTER — Encounter: Admission: RE | Admit: 2010-09-15 | Discharge: 2010-09-15 | Payer: Self-pay | Admitting: Oncology

## 2010-09-15 ENCOUNTER — Encounter: Payer: Self-pay | Admitting: Internal Medicine

## 2010-09-18 ENCOUNTER — Encounter: Payer: Self-pay | Admitting: Internal Medicine

## 2010-09-18 LAB — CBC WITH DIFFERENTIAL/PLATELET
EOS%: 4.9 % (ref 0.0–7.0)
Eosinophils Absolute: 0.3 10*3/uL (ref 0.0–0.5)
HGB: 12.7 g/dL (ref 11.6–15.9)
MCV: 95 fL (ref 79.5–101.0)
MONO%: 8.4 % (ref 0.0–14.0)
NEUT#: 3.4 10*3/uL (ref 1.5–6.5)
RBC: 3.88 10*6/uL (ref 3.70–5.45)
RDW: 13.4 % (ref 11.2–14.5)
lymph#: 1.8 10*3/uL (ref 0.9–3.3)

## 2010-09-18 LAB — COMPREHENSIVE METABOLIC PANEL
AST: 23 U/L (ref 0–37)
Albumin: 3.9 g/dL (ref 3.5–5.2)
Alkaline Phosphatase: 63 U/L (ref 39–117)
Calcium: 9.4 mg/dL (ref 8.4–10.5)
Chloride: 102 mEq/L (ref 96–112)
Glucose, Bld: 119 mg/dL — ABNORMAL HIGH (ref 70–99)
Potassium: 4.4 mEq/L (ref 3.5–5.3)
Sodium: 139 mEq/L (ref 135–145)
Total Protein: 6.5 g/dL (ref 6.0–8.3)

## 2010-09-20 ENCOUNTER — Encounter: Admission: RE | Admit: 2010-09-20 | Discharge: 2010-09-20 | Payer: Self-pay | Admitting: Oncology

## 2010-12-16 ENCOUNTER — Other Ambulatory Visit: Payer: Self-pay | Admitting: Oncology

## 2010-12-16 DIAGNOSIS — Z853 Personal history of malignant neoplasm of breast: Secondary | ICD-10-CM

## 2010-12-17 ENCOUNTER — Encounter: Payer: Self-pay | Admitting: Oncology

## 2010-12-24 LAB — CONVERTED CEMR LAB
Direct LDL: 141.6 mg/dL
HDL: 58.9 mg/dL (ref >39.0)
Hgb A1c MFr Bld: 5.6 % (ref 4.6–6.0)
TSH: 1.21 microintl units/mL (ref 0.35–5.50)

## 2010-12-28 NOTE — Assessment & Plan Note (Signed)
Summary: flu shot/cbs   Nurse Visit   Allergies: 1)  ! Sulfa  Orders Added: 1)  Flu Vaccine 93yrs + MEDICARE PATIENTS [Q2039] 2)  Administration Flu vaccine - MCR [G0008] Flu Vaccine Consent Questions     Do you have a history of severe allergic reactions to this vaccine? no    Any prior history of allergic reactions to egg and/or gelatin? no    Do you have a sensitivity to the preservative Thimersol? no    Do you have a past history of Guillan-Barre Syndrome? no    Do you currently have an acute febrile illness? no    Have you ever had a severe reaction to latex? no    Vaccine information given and explained to patient? yes    Are you currently pregnant? no    Lot Number:[id number]   Exp Date:05/26/2011   Site Given  Right Deltoid IMistration Flu vaccine - MCR [G0008] .lbmedflu

## 2010-12-28 NOTE — Letter (Signed)
[facility]   Imported By: Lanelle Bal 03/13/2010 09:55:49  _____________________________________________________________________  External Attachment:    Type:   Image     Comment:   External Document

## 2010-12-28 NOTE — Consult Note (Signed)
[facility]   Imported By: Florinda Marker 09/29/2010 10:07:05  _____________________________________________________________________  External Attachment:    Type:   Image     Comment:   External Document

## 2011-01-01 ENCOUNTER — Encounter (INDEPENDENT_AMBULATORY_CARE_PROVIDER_SITE_OTHER): Payer: Medicare Other | Admitting: Internal Medicine

## 2011-01-01 ENCOUNTER — Encounter: Payer: Self-pay | Admitting: Internal Medicine

## 2011-01-01 DIAGNOSIS — Z23 Encounter for immunization: Secondary | ICD-10-CM

## 2011-01-01 DIAGNOSIS — I10 Essential (primary) hypertension: Secondary | ICD-10-CM

## 2011-01-01 DIAGNOSIS — I719 Aortic aneurysm of unspecified site, without rupture: Secondary | ICD-10-CM

## 2011-01-01 DIAGNOSIS — Z0000 Encounter for general adult medical examination without abnormal findings: Secondary | ICD-10-CM

## 2011-01-01 DIAGNOSIS — I712 Thoracic aortic aneurysm, without rupture, unspecified: Secondary | ICD-10-CM | POA: Insufficient documentation

## 2011-01-01 DIAGNOSIS — Z136 Encounter for screening for cardiovascular disorders: Secondary | ICD-10-CM

## 2011-01-01 DIAGNOSIS — R51 Headache: Secondary | ICD-10-CM | POA: Insufficient documentation

## 2011-01-01 DIAGNOSIS — R519 Headache, unspecified: Secondary | ICD-10-CM | POA: Insufficient documentation

## 2011-01-01 DIAGNOSIS — A319 Mycobacterial infection, unspecified: Secondary | ICD-10-CM | POA: Insufficient documentation

## 2011-01-03 ENCOUNTER — Telehealth (INDEPENDENT_AMBULATORY_CARE_PROVIDER_SITE_OTHER): Payer: Self-pay | Admitting: *Deleted

## 2011-01-11 NOTE — Assessment & Plan Note (Signed)
Summary: cpx/lch   verify 2nd number for this patient  [phone]---is i...   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  History of Present Illness: Here for Medicare AWV: 1.Risk factors based on Past M, S, F history:see Diagnoses ; chart updated 2.Physical Activities: walking up to 3X/week 3.Depression/mood: no issues  4.Hearing: whisper heard @ 6 ft 5.ADL's: no limitations 6.Fall Risk: single fall in past 12 months  due to lightheadedness 7.Home Safety: no issues 8.Height, weight, &visual acuity:see VS 9.Counseling: POA & Living Will in place 10.Labs ordered based on risk factors:see recommendations  11.Referral Coordination:Mammograms 08/2010; Oncology monitor in place . Headache Clinic referral will be made. 12. Care Plan: see Instructions 13.Cognitive Assessment: Oriented X3 ;  memory & recall excellent ; "WORLD " spelled backwards; mood & affect .normal.    Hypertension Follow-Up: The patient reports lightheadedness, headaches, and fatigue, but denies urinary frequency and edema.  Associated symptoms include dyspnea.  The patient denies the following associated symptoms: chest pain, palpitations, and syncope.  Compliance with medications (by patient report) has been near 100%.  The patient reports that dietary compliance has been good.  Adjunctive  measures currently used by the patient include salt restriction.  BP 115/70 @ home.She has had  occasional  hypotension w/o trigger.    Headaches:  The patient reports nausea, but denies vomiting, sweats, tearing of eyes, nasal congestion, sinus pain, sinus pressure, photophobia, and phonophobia.  The headache is described as intermittent and dull.  The location of the pain is unilateral on the left , starting in occipital area & radiating behind OS.  The patient denies the following high-risk features: fever, neck pain/stiffness, vision loss or change, focal weakness, altered mental status, and rash.  The headaches  have no trigger.  Prior treatment has included 2  acetaminophen bid , 1 Tramadol  bid & 2 gabapentin  tid  w/o control . PMH of migraines ; she was seen @ [facility] , last in 2002 by Dr Vela Prose.   Lipid Management History:      Positive NCEP/ATP III risk factors include female age 31 years old or older, hypertension, peripheral vascular disease, and aortic aneurysm.  Negative NCEP/ATP III risk factors include no history of early menopause without estrogen hormone replacement, non-diabetic, no family history for ischemic heart disease, non-tobacco-user status, no ASHD (atherosclerotic heart disease), and no prior stroke/TIA.     Preventive Screening-Counseling & Management  Alcohol-Tobacco     Alcohol drinks/day: 0     Smoking Status: never  Caffeine-Diet-Exercise     Caffeine use/day: no coffee or tea ; Diet Dr Reino Kent 36-40 oz / day  Hep-HIV-STD-Contraception  Dental Visit-last 6 months yes     Sun Exposure-Excessive: no  Safety-Violence-Falls     Seat Belt Use: yes     Smoke Detectors: yes      Blood Transfusions:  no.        Travel History:  2002 [place].    Allergies: 1)  ! 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  Past Surgical History: Open lung biopsy 2002 : [name] , Dr Edwyna Shell Cath 2007 : essentially  negative for significant CAD Appendectomy 2004 Cholecystectomy 2001 Colonoscopy 2002,2007 : negative  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 Caffeine use/day:  no coffee or tea ; Diet Dr Reino Kent 36-40 oz / day Dental Care w/in 6 mos.:  yes Sun Exposure-Excessive:  no Seat Belt Use:  yes Blood Transfusions:  no  Physical Exam  General:  well-nourished,in no acute distress; alert,appropriate and cooperative throughout examination Head:  Normocephalic and atraumatic without obvious abnormalities. Eyes:  No corneal or conjunctival inflammation noted. EOMI. Perrla. Field  of Vision grossly normal. Ears:  External ear exam shows no significant lesions or deformities.  Otoscopic examination reveals clear canals, tympanic membranes are intact bilaterally without bulging, retraction, inflammation or discharge. Hearing is grossly normal bilaterally. Nose:  External nasal examination shows no deformity or inflammation. Nasal  mucosa are pink and moist without lesions or exudates. Mouth:  Oral mucosa and oropharynx without lesions or exudates.  Teeth in good repair. Neck:  No deformities, masses, or tenderness noted. Lungs:  Normal respiratory effort, chest expands symmetrically. Lungs : isolated inspiratory "pop" LUL , no crackles or wheezes. Heart:  normal rate, regular rhythm, no gallop, no rub, no JVD, no HJR, and grade 1 /6 systolic murmur.   Abdomen:  Bowel sounds positive,abdomen soft and non-tender without masses, organomegaly or hernias noted. Genitalia:  Dr Leda Quail Msk:  No deformity or scoliosis noted of thoracic or lumbar spine.   Pulses:  R and L carotid,radial  and posterior tibial pulses are full and equal bilaterally. decreased DPP Extremities:  No clubbing, cyanosis, edema. Minor isolated DIP  deformity noted with normal full range of motion of all joints.   Neurologic:  alert & oriented X3, cranial nerves II-XII intact, strength normal in all extremities, sensation increased  to light touch  face , DTRs symmetrical and normal, finger-to-nose  with slight tremor l, and Romberg negative.   Skin:  Intact without suspicious lesions or rashes Cervical Nodes:  No lymphadenopathy noted Axillary Nodes:  No palpable lymphadenopathy Psych:  memory intact for recent and remote, normally interactive, and good eye contact.     Impression & Recommendations:  Problem # 1:  PREVENTIVE HEALTH  CARE (ICD-V70.0)  Orders: Medicare -1st Annual Wellness Visit [phone])  Problem # 2:  HEADACHE (ICD-784.0)  Her updated medication list for this problem includes:    Metoprolol Tartrate 25 Mg Tabs (Metoprolol tartrate) .[name]... 1 by mouth two times a day -    Adult Aspirin Ec Low Strength 81 Mg Tbec (Aspirin) ..... Once daily  Orders: Headache Clinic Referral (Headache)  Problem # 3:  HYPERTENSION (ICD-401.9)  Her updated medication list for this problem includes:    Metoprolol Tartrate 25 Mg Tabs (Metoprolol  tartrate) .[name]... 1 by mouth two times a day -  Orders: EKG w/ Interpretation (93000)  Problem # 4:  BREAST CANCER, HX OF (ICD-V10.3) as per Dr Cyndie Chime  Problem # 5:  ATYPICAL MYCOBACTERIAL INFECTION (ICD-031.9) as per Dr Orvan Falconer , ID  Problem # 6:  AORTIC ANEURYSM (ICD-441.9) incidental CT finding  Complete Medication List: 1)  Gabapentin 300 Mg Caps (Gabapentin) .[name].. 1-3 by mouth q 8 hrs as needed 2)  Losartan Potassium 50 Mg  .[name] KitchenMarland Kitchen. 1 by mouth once daily 3)  Metoprolol Tartrate 25 Mg Tabs (Metoprolol tartrate) .[name].. 1 by mouth two times a day - 4)  Zometa 4 Mg/93ml Conc (Zoledronic acid) .... Q six months 5)  Adult Aspirin Ec Low Strength 81 Mg Tbec (Aspirin) .... Once daily 6)  Lidoderm 5 % Ptch (Lidocaine) .... Apply one daily x 12 hours  Other Orders: Tdap => 47yrs IM (60454) Admin 1st Vaccine (09811)  Lipid Assessment/Plan:      Based on NCEP/ATP III, the patient's risk factor category is "history of coronary disease, peripheral vascular disease, cerebrovascular disease, or aortic aneurysm".  The patient's lipid goals are as follows: Total cholesterol goal is 200; LDL cholesterol goal is 130; HDL cholesterol goal is 40; Triglyceride goal is 150.    Patient Instructions: 1)  keep Headache Diary  as discussed. 2)  Check your Blood Pressure regularly. If it is above: 135/85 ON AVERAGE  you should make an appointment.This is critical because of the Aortic Root Aneurysm. The aneurysm should be evaluated by Dr Dennie Bible Burney's group if this has not been done.Please call if referral needed.Last labs were in 02/2010 & last CXray in 08/2010.Please have fasting labs performed:RPR; 3)  BMP ; 4)  Hepatic Panel ; 5)  Lipid Panel ; 6)  TSH;: 7)  CBC w/ Diff . See Diagnoses for Codes. Prescriptions: METOPROLOL TARTRATE 25 MG  TABS (METOPROLOL TARTRATE) 1 by mouth two times a day -  #180 x 3   Entered and Authorized by:   Marga Melnick MD   Signed by:   Marga Melnick MD on 01/01/2011    Method used:   Print then Give to Patient   RxID:   [id number] GABAPENTIN 300 MG CAPS (GABAPENTIN) 1-3 by mouth q 8 hrs as needed  #180 x 3   Entered and Authorized by:   Marga Melnick MD   Signed by:   Marga Melnick MD on 01/01/2011   Method used:   Print then Give to Patient   RxID:   [id number] LOSARTAN POTASSIUM 50 MG 1 by mouth once daily  #90 x 3   Entered and Authorized by:   Marga Melnick MD   Signed by:   Marga Melnick MD on 01/01/2011   Method used:   Print then Give to Patient   RxID:   [id number]    Orders Added: 1)  Tdap => 65yrs IM [90715] 2)  Admin 1st Vaccine [90471] 3)  Medicare -1st  Annual Wellness Visit [G0438] 4)  Est. Patient Level III [04540] 5)  EKG w/ Interpretation [93000] 6)  Headache Clinic Referral [Headache]   Immunizations Administered:  Tetanus Vaccine:    Vaccine Type: Tdap    Site: right deltoid    Mfr: GlaxoSmithKline    Dose: 0.5 ml    Route: IM    Given by: Shonna Chock CMA    Exp. Date: 09/14/2012    Lot #: [id number]    VIS given: 10/13/08 version given January 01, 2011.   Immunizations Administered:  Tetanus Vaccine:    Vaccine Type: Tdap    Site: right deltoid    Mfr: GlaxoSmithKline    Dose: 0.5 ml    Route: IM    Given by: Shonna Chock CMA    Exp. Date: 09/14/2012    Lot #: [id number]    VIS given: 10/13/08 version given January 01, 2011.

## 2011-01-11 NOTE — Progress Notes (Signed)
Summary: refill 90 day   Phone Note Refill Request Message from:  Fax from Pharmacy on January 03, 2011 11:27 AM  Refills Requested: Medication #1:  GABAPENTIN 300 MG CAPS 1-3 by mouth q 8 hrs as needed [facility] *pt said shoud have been for 90 days this would be 270   [facility] [employer] [phone]   Initial call taken by: Okey [place]al [place],  January 03, 2011 11:39 AM    Prescriptions: GABAPENTIN 300 MG CAPS (GABAPENTIN) 1-3 by mouth q 8 hrs as needed  #270 x 3   Entered by:   Shonna Chock CMA   Authorized by:   Marga Melnick MD   Signed by:   Shonna Chock CMA on 01/03/2011   Method used:   Electronically to        [id number]       [address]       [place], [place]  [address]       Ph: [phone]       Fax: [phone]   RxID:   [id number]

## 2011-03-07 ENCOUNTER — Ambulatory Visit (INDEPENDENT_AMBULATORY_CARE_PROVIDER_SITE_OTHER)
Admission: RE | Admit: 2011-03-07 | Discharge: 2011-03-07 | Disposition: A | Discharge status: Home / Self Care | Payer: Medicare Other | Source: Ambulatory Visit | Attending: Internal Medicine | Admitting: Internal Medicine

## 2011-03-07 ENCOUNTER — Encounter: Payer: Self-pay | Admitting: Internal Medicine

## 2011-03-07 ENCOUNTER — Ambulatory Visit (INDEPENDENT_AMBULATORY_CARE_PROVIDER_SITE_OTHER): Payer: Medicare Other | Admitting: Internal Medicine

## 2011-03-07 DIAGNOSIS — R0609 Other forms of dyspnea: Secondary | ICD-10-CM

## 2011-03-07 DIAGNOSIS — R0989 Other specified symptoms and signs involving the circulatory and respiratory systems: Secondary | ICD-10-CM

## 2011-03-07 DIAGNOSIS — J40 Bronchitis, not specified as acute or chronic: Secondary | ICD-10-CM

## 2011-03-07 DIAGNOSIS — R0600 Dyspnea, unspecified: Secondary | ICD-10-CM

## 2011-03-07 MED ORDER — PREDNISONE 20 MG PO TABS: 20.0000 mg | ORAL_TABLET | Freq: Two times a day (BID) | ORAL | Status: AC

## 2011-03-07 MED ORDER — AMOXICILLIN-POT CLAVULANATE 875-125 MG PO TABS: 1.0000 | ORAL_TABLET | Freq: Two times a day (BID) | ORAL | Status: AC

## 2011-03-07 NOTE — Progress Notes (Signed)
  Subjective:    Patient ID: Crystal Bass, female    DOB: 04-15-1941, 70 y.o.   MRN: [id number]  Shortness of Breath This is a chronic (exacerbation of chronoic SOB) problem. Episode onset: 2 weeks ago , ? related to Topamax or allergies. The problem occurs constantly (worse talking & with ambulation). The problem has been gradually worsening. Duration: resolves with rest. Associated symptoms include headaches, orthopnea, rhinorrhea, sputum production and wheezing. Pertinent negatives include no abdominal pain, chest pain, claudication, ear pain, fever, hemoptysis, leg pain, leg swelling, PND or sore throat. The symptoms are aggravated by any activity. Associated symptoms comments: No chest pain , but "tightness" with exertion. Cough is chronic ; sputum is like "green peas" ever since biopsy 2001. She has tried nothing for the symptoms. Her past medical history is significant for allergies, asthma, chronic lung disease, COPD and pneumonia. There is no history of DVT, a heart failure or PE. (Atypical Mycobacterial infection)       Review of Systems  Constitutional: Negative for fever.  HENT: Positive for rhinorrhea. Negative for ear pain and sore throat.   Respiratory: Positive for sputum production, shortness of breath and wheezing. Negative for hemoptysis.   Cardiovascular: Positive for orthopnea. Negative for chest pain, claudication, leg swelling and PND.  Gastrointestinal: Negative for abdominal pain.  Neurological: Positive for headaches.        Objective:   Physical Exam she is in no acute distress but appears significantly fatigued.  She has no conjunctival inflammation or scleral icterus.  The otic canals and tympanic membranes are unremarkable except for minor scarring.  Nares are dry without significant exudates.  The oral pharynx is moist; there is no significant erythema or inflammation.  No exudates are noted intraorally.  There is no increased work of breathing at  rest but breath sounds are decreased. Faint rales were present diffusely.  O2 sats at rest are 95%; with ambulation her oxygen saturation drops to 92%.  She has a slow S4 without significant murmurs or gallops.  Extremities reveal no cyanosis or clubbing. She has no edema. All pulses are intact without bruits except for decreased dorsalis pedis pulses.  Her skin is warm and dry without significant lesions. There is no jaundice.  I can appreciate no lymphadenopathy about the neck or axilla.  Her mood and affect is slightly flat; she is not significantly anxious.          Assessment & Plan:    She  having increasing dyspnea in the context of known atypical mycobacterial disease & bronchiectasis . She questions relationship to Topamax or allergens.  Rest oxygen saturations are normal; there is not significant desaturation with ambulation despite her history of increasing shortness of breath with walking.    Her sputum is chronically  purulent  Plan: she'll be placed on broad-spectrum antibiotics. Chest x-ray will be performed.  CBC and differential will be drawn. Pending return of these studies a short course of prednisone and inhaled bronchodilators will be prescribed. A sample of Advair 100/50 was provided .

## 2011-03-07 NOTE — Patient Instructions (Signed)
Use sample of Advair  As follows : 1 inhalation every 12 hrs ; gargle & spit after use.

## 2011-03-08 ENCOUNTER — Ambulatory Visit: Payer: Medicare Other | Admitting: Internal Medicine

## 2011-03-08 LAB — CBC WITH DIFFERENTIAL/PLATELET
Basophils Relative: 0.8 % (ref 0.0–3.0)
Eosinophils Relative: 3.9 % (ref 0.0–5.0)
HCT: 39.8 % (ref 36.0–46.0)
Hemoglobin: 13.7 g/dL (ref 12.0–15.0)
Lymphs Abs: 1.5 10*3/uL (ref 0.7–4.0)
MCV: 95.3 fl (ref 78.0–100.0)
Monocytes Absolute: 0.5 10*3/uL (ref 0.1–1.0)
Neutro Abs: 0.7 10*3/uL — ABNORMAL LOW (ref 1.4–7.7)
Platelets: 188 10*3/uL (ref 150.0–400.0)
RBC: 4.18 Mil/uL (ref 3.87–5.11)
WBC: 2.8 10*3/uL — ABNORMAL LOW (ref 4.5–10.5)

## 2011-03-09 ENCOUNTER — Telehealth: Payer: Self-pay

## 2011-03-09 DIAGNOSIS — J40 Bronchitis, not specified as acute or chronic: Secondary | ICD-10-CM

## 2011-03-09 DIAGNOSIS — R0600 Dyspnea, unspecified: Secondary | ICD-10-CM

## 2011-03-09 NOTE — Telephone Encounter (Signed)
I faxed copy of xray to [phone], patient was in there office and indicated she was no better and would like to know what her xray showed. I informed caller for [name] allergy and asthma that we will place referral for Pulmonary

## 2011-03-12 ENCOUNTER — Other Ambulatory Visit: Payer: Self-pay | Admitting: Oncology

## 2011-03-12 ENCOUNTER — Encounter (HOSPITAL_BASED_OUTPATIENT_CLINIC_OR_DEPARTMENT_OTHER): Payer: Medicare Other | Admitting: Oncology

## 2011-03-12 ENCOUNTER — Ambulatory Visit
Admission: RE | Admit: 2011-03-12 | Discharge: 2011-03-12 | Disposition: A | Discharge status: Home / Self Care | Payer: Medicare Other | Source: Ambulatory Visit | Attending: Oncology | Admitting: Oncology

## 2011-03-12 ENCOUNTER — Other Ambulatory Visit: Payer: Self-pay

## 2011-03-12 DIAGNOSIS — Z8543 Personal history of malignant neoplasm of ovary: Secondary | ICD-10-CM

## 2011-03-12 DIAGNOSIS — Z853 Personal history of malignant neoplasm of breast: Secondary | ICD-10-CM

## 2011-03-12 DIAGNOSIS — C50419 Malignant neoplasm of upper-outer quadrant of unspecified female breast: Secondary | ICD-10-CM

## 2011-03-12 DIAGNOSIS — M810 Age-related osteoporosis without current pathological fracture: Secondary | ICD-10-CM

## 2011-03-12 LAB — CBC WITH DIFFERENTIAL/PLATELET
BASO%: 0.1 % (ref 0.0–2.0)
HCT: 38.1 % (ref 34.8–46.6)
LYMPH%: 9.4 % — ABNORMAL LOW (ref 14.0–49.7)
MCH: 32.2 pg (ref 25.1–34.0)
MCHC: 33.9 g/dL (ref 31.5–36.0)
MCV: 95 fL (ref 79.5–101.0)
MONO%: 3.9 % (ref 0.0–14.0)
NEUT%: 86.6 % — ABNORMAL HIGH (ref 38.4–76.8)
Platelets: 197 10*3/uL (ref 145–400)
RBC: 4.01 10*6/uL (ref 3.70–5.45)

## 2011-03-12 LAB — COMPREHENSIVE METABOLIC PANEL
Alkaline Phosphatase: 52 U/L (ref 39–117)
BUN: 20 mg/dL (ref 6–23)
Creatinine, Ser: 0.78 mg/dL (ref 0.40–1.20)
Glucose, Bld: 109 mg/dL — ABNORMAL HIGH (ref 70–99)
Total Bilirubin: 1.1 mg/dL (ref 0.3–1.2)

## 2011-03-12 MED ORDER — IOHEXOL 300 MG/ML  SOLN
75.0000 mL | Freq: Once | INTRAMUSCULAR | Status: AC | PRN
  Administered 2011-03-12: 75 mL via INTRAVENOUS

## 2011-03-13 ENCOUNTER — Encounter: Payer: Self-pay | Admitting: Internal Medicine

## 2011-03-13 ENCOUNTER — Ambulatory Visit (INDEPENDENT_AMBULATORY_CARE_PROVIDER_SITE_OTHER): Payer: Medicare Other | Admitting: Internal Medicine

## 2011-03-13 ENCOUNTER — Institutional Professional Consult (permissible substitution): Payer: Medicare Other | Admitting: Internal Medicine

## 2011-03-13 VITALS — BP 114/70 | HR 73 | Temp 98.0°F | Ht 63.0 in | Wt 146.6 lb

## 2011-03-13 DIAGNOSIS — A319 Mycobacterial infection, unspecified: Secondary | ICD-10-CM

## 2011-03-13 DIAGNOSIS — J309 Allergic rhinitis, unspecified: Secondary | ICD-10-CM

## 2011-03-13 DIAGNOSIS — R059 Cough, unspecified: Secondary | ICD-10-CM

## 2011-03-13 DIAGNOSIS — R05 Cough: Secondary | ICD-10-CM

## 2011-03-13 NOTE — Progress Notes (Signed)
  Subjective:    Patient ID: Crystal Bass, female    DOB: 06/20/1941, 70 y.o.   MRN: [id number]  HPI  55 yowf never smoker with h/o [name] referred to Pulmonary clinic by Dr Alwyn Ren for chronic cough.   03/13/2011 Initial pulmonary office eval in EMR era main c/o chronic cough (x years) and sob and worse x 3 weeks abruptly sob  Assoc with cough with green mucus which is a lot better p rx with augmentin  but breathing not better to her satisfaction since starting topamax with variable doe and resting sob and  having trouble talking.  No overt hb. No better on advair.    Pt denies any significant sore throat, dysphagia, itching,  nasal congestion or excess/ purulent secretions,  fever, chills, sweats, unintended wt loss, pleuritic or exertional cp, hempoptysis, orthopnea pnd or leg swelling.    Also denies any obvious fluctuation of symptoms with weather or environmental changes or other aggravating or alleviating factors.        Review of Systems  Constitutional: Negative for fever, chills and unexpected weight change.  HENT: Positive for sneezing. Negative for ear pain, nosebleeds, congestion, sore throat, rhinorrhea, trouble swallowing, dental problem, voice change, postnasal drip and sinus pressure.   Eyes: Negative for visual disturbance.  Respiratory: Positive for cough and shortness of breath. Negative for choking.   Cardiovascular: Negative for chest pain and leg swelling.  Gastrointestinal: Negative for vomiting, abdominal pain and diarrhea.  Genitourinary: Negative for difficulty urinating.  Musculoskeletal: Negative for arthralgias.  Skin: Negative for rash.  Neurological: Positive for headaches. Negative for tremors and syncope.  Hematological: Does not bruise/bleed easily.       Objective:   Physical Exam   amb hoarse wf with classic pseudowheeze better with plm Wt  146 03/13/2011  HEENT mild turbinate edema.  Oropharynx no thrush or excess pnd or cobblestoning.  No JVD  or cervical adenopathy. Mild accessory muscle hypertrophy. Trachea midline, nl thryroid. Chest was hyperinflated by percussion with diminished breath sounds and moderate increased exp time without wheeze. Hoover sign positive at mid inspiration. Regular rate and rhythm without murmur gallop or rub or increase P2 or edema.  Abd: no hsm, nl excursion. Ext warm without cyanosis or clubbing.    CT chest 03/12/11 post op changes on R, scattered perbronch nodules and mild bronchiectasis Assessment & Plan:

## 2011-03-13 NOTE — Assessment & Plan Note (Signed)
Classic Upper airway cough syndrome, so named because it's frequently impossible to sort out how much is  CR/sinusitis with freq throat clearing (which can be related to primary GERD)   vs  causing  secondary (" extra esophageal")  GERD from wide swings in gastric pressure that occur with throat clearing, often  promoting self use of mint and menthol lozenges that reduce the lower esophageal sphincter tone and exacerbate the problem further in a cyclical fashion.   These are the same pts who not infrequently have failed to tolerate ace inhibitors,  dry powder inhalers or biphosphonates or report having reflux symptoms that don't respond to standard doses of PPI , and are easily confused as having aecopd or asthma flares,   Try off DPI (Advair) and max gerd rx with next step spirometry/ sinus CT

## 2011-03-13 NOTE — Patient Instructions (Signed)
Stop advair  Start Prilosec 20 mg Take 30-60 min before first meal of the day and Pepcid 20 mg one at bedtime  GERD (REFLUX)  is an extremely common cause of respiratory symptoms, many times with no significant heartburn at all.    It can be treated with medication, but also with lifestyle changes including avoidance of late meals, excessive alcohol, smoking cessation, and avoid fatty foods, chocolate, peppermint, colas, red wine, and acidic juices such as orange juice.  NO MINT OR MENTHOL PRODUCTS SO NO COUGH DROPS  USE SUGARLESS CANDY INSTEAD (jolley ranchers or Stover's)  NO OIL BASED VITAMINS   Please schedule a follow up office visit in 2 weeks, sooner if needed

## 2011-03-14 NOTE — Assessment & Plan Note (Addendum)
Classic MPN pattern assoc with mild bronchiectasis is virtually pathognomic in this setting for [name]; however, our local experience with this dz is that the treatment guidelines more often than not cause more side effects than the dz so unless there are refractory symptoms attributable to the [name] or obvious radiographic progression would hold chronic multiple abx rx in favor of short course fluoroquinolone for any flare of cough thought to be related to [name] (no other obvious source such as sinus dz)

## 2011-03-19 ENCOUNTER — Encounter (HOSPITAL_BASED_OUTPATIENT_CLINIC_OR_DEPARTMENT_OTHER): Payer: Medicare Other | Admitting: Oncology

## 2011-03-19 ENCOUNTER — Other Ambulatory Visit: Payer: Self-pay | Admitting: Oncology

## 2011-03-19 DIAGNOSIS — Z8543 Personal history of malignant neoplasm of ovary: Secondary | ICD-10-CM

## 2011-03-19 DIAGNOSIS — Z853 Personal history of malignant neoplasm of breast: Secondary | ICD-10-CM

## 2011-03-19 DIAGNOSIS — J438 Other emphysema: Secondary | ICD-10-CM

## 2011-03-19 DIAGNOSIS — G894 Chronic pain syndrome: Secondary | ICD-10-CM

## 2011-03-19 DIAGNOSIS — C50919 Malignant neoplasm of unspecified site of unspecified female breast: Secondary | ICD-10-CM

## 2011-03-26 ENCOUNTER — Encounter: Payer: Self-pay | Admitting: Internal Medicine

## 2011-03-28 ENCOUNTER — Ambulatory Visit (INDEPENDENT_AMBULATORY_CARE_PROVIDER_SITE_OTHER): Payer: Medicare Other | Admitting: Internal Medicine

## 2011-03-28 ENCOUNTER — Other Ambulatory Visit: Payer: Medicare Other

## 2011-03-28 ENCOUNTER — Other Ambulatory Visit: Payer: Self-pay | Admitting: Internal Medicine

## 2011-03-28 ENCOUNTER — Encounter: Payer: Self-pay | Admitting: Internal Medicine

## 2011-03-28 ENCOUNTER — Other Ambulatory Visit (INDEPENDENT_AMBULATORY_CARE_PROVIDER_SITE_OTHER): Payer: Medicare Other

## 2011-03-28 VITALS — BP 118/80 | HR 58 | Temp 97.5°F | Ht 63.0 in | Wt 144.6 lb

## 2011-03-28 DIAGNOSIS — R059 Cough, unspecified: Secondary | ICD-10-CM

## 2011-03-28 DIAGNOSIS — R0989 Other specified symptoms and signs involving the circulatory and respiratory systems: Secondary | ICD-10-CM

## 2011-03-28 DIAGNOSIS — A319 Mycobacterial infection, unspecified: Secondary | ICD-10-CM

## 2011-03-28 DIAGNOSIS — R0600 Dyspnea, unspecified: Secondary | ICD-10-CM

## 2011-03-28 DIAGNOSIS — I10 Essential (primary) hypertension: Secondary | ICD-10-CM

## 2011-03-28 DIAGNOSIS — R05 Cough: Secondary | ICD-10-CM

## 2011-03-28 DIAGNOSIS — R0609 Other forms of dyspnea: Secondary | ICD-10-CM

## 2011-03-28 LAB — BASIC METABOLIC PANEL
CO2: 24 mEq/L (ref 19–32)
Chloride: 107 mEq/L (ref 96–112)
GFR: 81.67 mL/min (ref >60.00)
Glucose, Bld: 84 mg/dL (ref 70–99)
Potassium: 4.5 mEq/L (ref 3.5–5.1)
Sodium: 139 mEq/L (ref 135–145)

## 2011-03-28 NOTE — Progress Notes (Signed)
Quick Note:  Spoke with pt and notified of results per Dr. Wert. Pt verbalized understanding and denied any questions.  ______ 

## 2011-03-28 NOTE — Assessment & Plan Note (Addendum)
Labs ok including hco3 and bnp and tsh Not able to reproduce this complaint today, needs full pft's

## 2011-03-28 NOTE — Progress Notes (Signed)
  Subjective:    Patient ID: Crystal Bass, female    DOB: Aug 25, 1941, 70 y.o.   MRN: [id number]  HPI   Subjective:    Patient ID: Crystal Bass, female    DOB: [birth year], 70 y.o.   MRN: [id number]  HPI  35  yowf never smoker with h/o [name] referred to Pulmonary clinic by Dr Alwyn Ren for chronic cough.   03/13/2011 Initial pulmonary office eval in EMR era main c/o chronic cough (x years) and sob and worse x 3 weeks abruptly sob  Assoc with cough with green mucus which is a lot better p rx with augmentin  but breathing not better to her satisfaction since starting topamax with variable doe and resting sob and  having trouble talking.  No overt hb. No better on advair.  > rec stop advair, start high dose ppi/ pepcid hs and diet  03/28/2011 ov / Troy Kanouse some better cough  But still sob with housework and walk at [employer], does not use [facility] parking. Pt denies any significant sore throat, dysphagia, itching, sneezing,  nasal congestion or excess/ purulent secretions,  fever, chills, sweats, unintended wt loss, pleuritic or exertional cp, hempoptysis, orthopnea pnd or leg swelling.    Also denies any obvious fluctuation of symptoms with weather or environmental changes or other aggravating or alleviating factors.              Objective:      Assessment & Plan:    Review of Systems     Objective:   Physical Exam    amb hoarse wf with marked improvement pseudowheeze better with plm Wt  146 03/13/2011  > 144 03/28/2011  HEENT mild turbinate edema.  Oropharynx no thrush or excess pnd or cobblestoning.  No JVD or cervical adenopathy. Mild accessory muscle hypertrophy. Trachea midline, nl thryroid. Chest was hyperinflated by percussion with diminished breath sounds and moderate increased exp time without wheeze. Hoover sign positive at mid inspiration. Regular rate and rhythm without murmur gallop or rub or increase P2 or edema.  Abd: no hsm, nl excursion. Ext warm without cyanosis or  clubbing.       CT chest 03/12/11 post op changes on R, scattered perbronch nodules and mild bronchiectasis Assessment & Plan:

## 2011-03-28 NOTE — Patient Instructions (Addendum)
Continue Prilosec 20 mg Take 30-60 min before first meal of the day and Pepcid 20 mg one at bedtime  GERD (REFLUX)  is an extremely common cause of respiratory symptoms, many times with no significant heartburn at all.    It can be treated with medication, but also with lifestyle changes including avoidance of late meals, excessive alcohol, smoking cessation, and avoid fatty foods, chocolate, peppermint, colas, red wine, and acidic juices such as orange juice.  NO MINT OR MENTHOL PRODUCTS SO NO COUGH DROPS  USE SUGARLESS CANDY INSTEAD (jolley ranchers or Stover's)  NO OIL BASED VITAMINS   Please schedule a follow up office visit   With PFT"s next available.

## 2011-03-28 NOTE — Assessment & Plan Note (Signed)
Classic Upper airway cough syndrome, so named because it's frequently impossible to sort out how much is  CR/sinusitis with freq throat clearing (which can be related to primary GERD)   vs  causing  secondary (" extra esophageal")  GERD from wide swings in gastric pressure that occur with throat clearing, often  promoting self use of mint and menthol lozenges that reduce the lower esophageal sphincter tone and exacerbate the problem further in a cyclical fashion.   These are the same pts who not infrequently have failed to tolerate ace inhibitors,  dry powder inhalers or biphosphonates or report having reflux symptoms that don't respond to standard doses of PPI , and are easily confused as having aecopd or asthma flares,   Much better on rx for gerd and off DPI very strongly support this dx but disclosed to pt Unlike when you get a prescription for eyeglasses, it's not possible to always walk out of this or any medical office with a perfect prescription that is immediately effective  based on any test that we offer here.    On the contrary, it may take several weeks for the full impact of changes recommened today - hopefully you will respond well.  If not, then we'll adjust your medication on your next visit accordingly, knowing more then than we can possibly know now - for now rec max gerd rx, leave off dpi and f/u pft's w/a .

## 2011-03-28 NOTE — Assessment & Plan Note (Signed)
No indication for treatment at present

## 2011-03-29 LAB — ALLERGY PROFILE REGION II-DC, DE, MD, ~~LOC~~, VA
Allergen, D pternoyssinus,d7: <0.35 kU/L (ref <0.35)
Alternaria Alternata: <0.35 kU/L (ref <0.35)
Bermuda Grass: <0.35 kU/L (ref <0.35)
Box Elder IgE: <0.35 kU/L (ref <0.35)
Cat Dander: <0.35 kU/L (ref <0.35)
Cockroach: <0.35 kU/L (ref <0.35)
Common Ragweed: <0.35 kU/L (ref <0.35)
Dog Dander: <0.35 kU/L (ref <0.35)
Elm IgE: <0.35 kU/L (ref <0.35)
Pecan/Hickory Tree IgE: <0.35 kU/L (ref <0.35)

## 2011-04-03 ENCOUNTER — Other Ambulatory Visit: Payer: Self-pay | Admitting: Oncology

## 2011-04-03 ENCOUNTER — Encounter (HOSPITAL_BASED_OUTPATIENT_CLINIC_OR_DEPARTMENT_OTHER): Payer: Medicare Other | Admitting: Oncology

## 2011-04-03 DIAGNOSIS — M810 Age-related osteoporosis without current pathological fracture: Secondary | ICD-10-CM

## 2011-04-03 DIAGNOSIS — C50419 Malignant neoplasm of upper-outer quadrant of unspecified female breast: Secondary | ICD-10-CM

## 2011-04-03 LAB — BASIC METABOLIC PANEL
CO2: 26 mEq/L (ref 19–32)
Chloride: 104 mEq/L (ref 96–112)
Creatinine, Ser: 0.75 mg/dL (ref 0.40–1.20)
Potassium: 4.2 mEq/L (ref 3.5–5.3)
Sodium: 138 mEq/L (ref 135–145)

## 2011-04-04 ENCOUNTER — Ambulatory Visit
Admission: RE | Admit: 2011-04-04 | Discharge: 2011-04-04 | Disposition: A | Discharge status: Home / Self Care | Payer: Medicare Other | Source: Ambulatory Visit | Attending: Oncology | Admitting: Oncology

## 2011-04-04 DIAGNOSIS — C50919 Malignant neoplasm of unspecified site of unspecified female breast: Secondary | ICD-10-CM

## 2011-04-10 ENCOUNTER — Ambulatory Visit (INDEPENDENT_AMBULATORY_CARE_PROVIDER_SITE_OTHER): Payer: Medicare Other | Admitting: Internal Medicine

## 2011-04-10 DIAGNOSIS — R05 Cough: Secondary | ICD-10-CM

## 2011-04-10 DIAGNOSIS — R0989 Other specified symptoms and signs involving the circulatory and respiratory systems: Secondary | ICD-10-CM

## 2011-04-10 DIAGNOSIS — J449 Chronic obstructive pulmonary disease, unspecified: Secondary | ICD-10-CM

## 2011-04-10 DIAGNOSIS — R0609 Other forms of dyspnea: Secondary | ICD-10-CM

## 2011-04-10 DIAGNOSIS — R059 Cough, unspecified: Secondary | ICD-10-CM

## 2011-04-10 DIAGNOSIS — R0600 Dyspnea, unspecified: Secondary | ICD-10-CM

## 2011-04-10 LAB — PULMONARY FUNCTION TEST

## 2011-04-10 NOTE — Progress Notes (Signed)
PFT done today. 

## 2011-04-10 NOTE — Progress Notes (Signed)
Quick Note:  Spoke with pt and notified of results per Dr. Wert. Pt verbalized understanding and denied any questions.  ______ 

## 2011-04-13 NOTE — H&P (Signed)
NAME:  JACKLYNN, DEHAAS NO.:  [id number]   MEDICAL RECORD NO.:  [id number]          PATIENT TYPE:  INP   LOCATION:  3742                         FACILITY:  [facility]   PHYSICIAN:  Titus Dubin. Alwyn Ren, MD,FACP,FCCPDATE OF BIRTH:  1942/12/14   DATE OF ADMISSION:  08/09/2006  DATE OF DISCHARGE:                                HISTORY & PHYSICAL   HISTORY OF PRESENT ILLNESS:  Crystal Bass was admitted to [facility] on August 09, 2006 with paroxysmal shortness of breath and chest  tightness.   While running errands, she experienced marked dizziness at approximately 10  a.m. the morning of the visit.  After eating lunch, she experienced  shortness of breath while ironing.  She lay down to rest with improvement.  She checked her blood pressure and found it to be 91/56.  When she attempted  to start ironing again, she experienced some shortness of breath as well as  heaviness over the sternum.  She had some clamminess, but no definite  nausea.   Significantly, she had similar symptoms in June of this year.  When seen on  May 27, 2006, she was describing labile blood pressure associated with  profound fatigue.  She felt it was related to changes in the ambient  temperature, with symptoms worse with high temperatures.  She states that  her blood pressure would drop as low as the 80s over 40 to 50s diastolic.  She mainly noted it after entering the house, having been out in the heat.  She denied any chest pain, dysrhythmia, flushing or diarrhea at that time.  Because of these symptoms, her blood pressure medicines were changed from  Norvasc 5 mg daily to lisinopril 20 mg daily.  CBC and differential, BUN,  creatinine, potassium and TSH were all normal at that time.   She was seen for this variability in blood pressure on June 26, 2006 as  well.  She was monitoring her blood pressure with a wrist cuff, as she could  not pump up a regular cuff.  She stated that she  was experiencing dyspnea on  exertion at that time with blood pressures varying from 41/27 to 90/70.  She  felt that most of the symptoms were occurring in the early morning between  9:30 and 10 a.m.  I asked her to purchase a digital cuff rather than relying  on an unreliable wrist cuff.  Lisinopril was changed to 20 mg one and a half  each morning with Cozaar 50 mg 12 hours later rather than taking the 2  medicines together.   From June 26, 2006 until August 09, 2006, she did not report any  significant symptoms.   She is now admitted to evaluate this paroxysmal hypotension, shortness of  breath and chest pain.   PAST MEDICAL HISTORY:  Her past medical history is incredibly long and  complicated.  1. She had tonsillectomy remotely.  2. She had a lumpectomy in 1991.  3. Cholecystectomy was performed in 2001.  4. She had a lumpectomy on the contralateral breast in 2002, which was      malignant.  That  year, she had a left mastectomy.  5. 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  6. She had a complete hysterectomy in 2004 with an appendectomy for      borderline cancer on Pap smear.  7. In 2005, she had a breast biopsy on the right.  8. Serial colonoscopies since 2002 have been negative.  9. She has been evaluated for hematuria in the past.  10.She has dyslipidemia as well.   FAMILY HISTORY:  Positive for lung cancer in her father at 23; he was a  smoker.  Mother had cardiopulmonary failure.  Sister had breast cancer and  colon polyps and osteopenia.  That sister subsequently had colon cancer  requiring a colectomy.  Paternal aunt had stroke and another paternal aunt  had osteoporosis and myasthenia.   SOCIAL HISTORY:  She has never smoked and does not drink.   ALLERGIES:  She has had anaphylactoid reaction to SULFA.  She is intolerant  or allergic to TETRACYCLINE and OXYCODONE.   MEDICATIONS:  1. She takes weekly allergy  shots.  2. She is on calcium and vitamin D.  3. [name] 300 mg twice a day.  4. Arimidex 1 mg daily.  5. Fosamax 70 mg weekly.  6. Lisinopril 20 mg one-half each morning.  7. Cozaar 50 mg each evening.   REVIEW OF SYSTEMS:  Outlined above.   She has peripheral neuropathy which is well-controlled on [name].  She  has not had headaches, flushing or diarrhea with the present illness.   She has had no signs of infection such as fever, chills or sweats.  She  denies any constitutional symptoms or weight loss.   Other than the shortness of breath, she has had no cough or sputum  production.   PHYSICAL EXAMINATION:  GENERAL:  On exam, she appears profoundly weak.  VITAL SIGNS:  Blood pressure was 140/90, O2 SATs 99% on room air, pulse 74,  respiratory rate of 22.  HEENT:  Arteriolar narrowing was present.  She had no scleral icterus.  Otolaryngologic exam was unremarkable.  NECK:  Thyroid was normal to palpation.  CARDIAC:  She had a loud S4 with slurring of the second heart sound.  CHEST:  Clear, despite the shortness of breath.  ABDOMEN:  Nontender with no organomegaly or masses.  LYMPHATICS:  I could not appreciate lymphadenopathy about the head, neck or  axillae.  BREASTS:  A left mastectomy was noted.  EXTREMITIES:  Homans sign was negative.  She had no peripheral edema.  Peripheral pulses were intact.  SKIN:  Slightly cool, but dry.  NEUROLOGIC:  Other than the generalized weakness, she had no localizing  neuropsychiatric findings.   ACCESSORY CLINICAL DATA:  EKG revealed new inferolateral changes.   She is now admitted with paroxysmal shortness of breath in the setting of  paroxysmal hypotension associated with chest tightness; this has been  accompanied by demonstrable EKG changes.   She will be admitted to stepdown or CCU and cardiology consult will be  pursued.  Serial cardiac enzymes will be  collected.  A CT scan will be performed, once an acute myocardial  infarction process has been ruled out.      Titus Dubin. Alwyn Ren, MD,FACP,FCCP  Electronically Signed     WFH/MEDQ  D:  08/13/2006  T:  08/13/2006  Job:  [id number]   cc:   Madaline Savage, M.D.  Genene Churn. Cyndie Chime, M.D.

## 2011-04-13 NOTE — Cardiovascular Report (Signed)
NAME:  Crystal Bass, Crystal Bass NO.:  [id number]   MEDICAL RECORD NO.:  [id number]          PATIENT TYPE:  INP   LOCATION:  2903                         FACILITY:  [facility]   PHYSICIAN:  Madaline Savage, M.D.DATE OF BIRTH:  03-Jun-1941   DATE OF PROCEDURE:  08/12/2006  DATE OF DISCHARGE:                              CARDIAC CATHETERIZATION   PROCEDURES PERFORMED:  1. Selective coronary angiography by Judkins technique.  2. Retrograde left heart catheterization.  3. Left ventricular angiography.  4. Abdominal aortography with nonselective renal artery visualization.   COMPLICATIONS:  None.   ENTRY SITE:  Right femoral.   DYE USED:  Omnipaque.   PATIENT PROFILE:  Crystal Bass is a 70 year old white married female patient  of Dr. Marga Melnick who has known hypertension and has recently developed  shortness of breath, chest pain and progressive ST-T abnormalities on her  EKG.  She was admitted to this hospital on August 09, 2006 and underwent  screening for myocardial infarction.  Those enzyme determinations were  negative.  She presents to the cath lab today for diagnostic cardiac  catheterization considering her risk factors and her unexplained episodic  hypotension.  Today's procedure was performed electively without  complications.   RESULTS:  Pressures:  The left ventricular pressure was 140/40 and diastolic  pressure 11.  Central aortic pressure was 140/70, mean of 100.  No aortic  valve gradient by pullback technique.   ANGIOGRAPHIC RESULTS:  The patient's coronary arteries were normal.  They  were very tortuous, probably related to her hypertension, but no significant  obstructive lesions were seen.  Anatomically, the left main coronary artery  trifurcated into a LAD, intermediate ramus branch, and left circumflex  branch.  The left circumflex branch was nondominant.  No lesions were seen  throughout the large septal perforator branch off the LAD, the  diagonal  branch off the LAD, or the LAD itself.  Likewise, the intermediate ramus  branch and small circumflex branch and its atrial branch were all normal as  well.   The right coronary artery was large and dominant, giving rise to a single  large acute marginal branch, and the RCA distally terminated into a large  posterolateral branch and a medium posterior descending branch.  No lesions  were seen.  The coronary arteries were very tortuous.  No lesions were seen.   Left ventricular angiography showed normal contractility to low normal  contractility with EF measuring 50-60% subjectively.  No mitral  regurgitation was seen.  Abdominal aortography showed a smooth abdominal  aorta with no evidence of aneurysm formation, and both renal arteries were  normal.  This was performed because of the patient's history of  hypertension.   FINAL DIAGNOSES:  1. Angiographically patent coronary arteries with a right coronary artery      dominant system.  2. Low normal to normal left ventricular systolic function, ejection      fraction 50-60%.  3. Normal renal arteries.   PLAN:  Continued medical therapy of hypertension and further workup as  needed for any hypotensive episodes that occur.  ______________________________  Madaline Savage, M.D.     WHG/MEDQ  D:  08/12/2006  T:  08/13/2006  Job:  [id number]   cc:   Titus Dubin. Alwyn Ren, MD,FACP,FCCP  [facility]  [facility]

## 2011-04-13 NOTE — Op Note (Signed)
NAME:  Crystal Bass, Crystal Bass                       ACCOUNT NO.:  [id number]   MEDICAL RECORD NO.:  [id number]                   PATIENT TYPE:  AMB   LOCATION:  DSC                                  FACILITY:  [facility]   PHYSICIAN:  Jimmye Norman III, M.D.               DATE OF BIRTH:  1941/11/16   DATE OF PROCEDURE:  08/03/2004  DATE OF DISCHARGE:                                 OPERATIVE REPORT   PREOPERATIVE DIAGNOSIS:  Right breast mass.   POSTOPERATIVE DIAGNOSIS:  Right breast mass.   PROCEDURE:  Open right breast biopsy.   SURGEON:  Jimmye Norman, M.D.   ASSISTANT:  None.   ANESTHESIA:  Monitored anesthesia care with IV sedation.   ESTIMATED BLOOD LOSS:  Less than 10 mL.   COMPLICATIONS:  None.   CONDITION:  Stable.   INDICATIONS FOR PROCEDURE:  The patient is a healthy 70 year old who had a  previous left mastectomy for breast cancer who comes in now with a  persistent mass in her left breast likely fibrocystic change but because of  her history, biopsy is warranted.   DESCRIPTION OF PROCEDURE:  The patient was taken to the operating room and  placed on the table in the supine position.  After an adequate amount of IV  sedation was given, she was prepped and draped in the usual sterile manner  exposing the right breast.   A considerable amount of time was spent prior to the incision to localize  the most prominent nodule in the medial aspect of her right breast.  It was  just about 1 cm away from the areolar edge and was more firm than any of the  other nodules in her right breast.  Other supposed nodules were actually rib  margins and perhaps fatty tissue which this mass appeared to be most off  too.  There was no dominant hard mass but this was being done mainly because  of her history of breast cancer.   We anesthetized the periareolar area with 1% Xylocaine without epinephrine  with the use of #15 blade to make a circular incision around the medial  aspect of the  areola.  We then dissected down into the subcutaneous and  could palpate the nodularity just medial to this area toward the breast  wall.  We grasped this area with an Allis forceps and then subsequently  dissected out a 1.5 x 2 cm piece of breast tissue using a #15 blade.  Hemostasis was then attained with electrocautery and then we closed.  We  closed with a deep stitch of 4-0 Vicryl and the skin was closed using a  running subcuticular stitch of 5-0 Vicryl.  We did inject 0.25% Marcaine  with epinephrine into the subcutaneous prior to closure.   The patient did not stop taking her aspirin until today, therefore possible  hematoma formation is there, however, we took a considerable amount of time  obtaining hemostasis.  Once the wound was closed, a sterile dressing was  applied.  All sponge, needle and instrument counts were correct.                                               Kathrin Ruddy, M.D.    JW/MEDQ  D:  08/03/2004  T:  08/03/2004  Job:  [id number]

## 2011-04-13 NOTE — Discharge Summary (Signed)
NAME:  Crystal Bass, Crystal Bass          ACCOUNT NO.:  [id number]   MEDICAL RECORD NO.:  [id number]          PATIENT TYPE:  INP   LOCATION:  3742                         FACILITY:  [facility]   PHYSICIAN:  Raenette Rover. Felicity Coyer, MDDATE OF BIRTH:  03/29/1941   DATE OF ADMISSION:  08/09/2006  DATE OF DISCHARGE:  08/12/2006                                 DISCHARGE SUMMARY   DISCHARGE DIAGNOSES:  1. Atypical chest pain.  2. Right lower lobe lung nodule.   HISTORY OF PRESENT ILLNESS:  Crystal Bass is a 70 year old white female  patient of Dr. Alwyn Ren who presented on August 09, 2006 with complaints of  marked dizziness while performing errands. After lunch on the day of  admission she noted shortness of breath while ironing and noted that this  was better with rest. She also noted some heaviness overlying her sternum.  She was admitted for further evaluation and treatment.   PAST MEDICAL HISTORY:  1. Status post total abdominal hysterectomy and bilateral salpingo-      oophorectomy and appendectomy 2004.  2. Cholecystectomy 2001.  3. Left mastectomy 2002.  4. History of breast cancer.   COURSE OF HOSPITALIZATION:  Problem #1. Atypical chest pain. Crystal Bass  was admitted and underwent serial cardiac enzymes which were negative x3.  She was seen in consultation by [facility]. During this  admission she underwent cardiac catheterization which was performed on  August 12, 2006 and was noted to have angiographically patent coronary  and had normal LV systolic function. It was recommended that she continue  medical treatment for hypertension. 2-D echo also revealed normal left  ventricular ejection fraction with a value of 55%.  Problem #2. Right lower lobe lung nodule. She was noted on CT chest to have  a right lower lobe lung nodule which was suspicious for primary bronchogenic  carcinoma. Plan at this time is for the patient to undergo outpatient PET  scan which will be  arranged by her primary care physician,  Dr. Alwyn Ren.   PERTINENT LABORATORIES AT DISCHARGE:  BUN 11, creatinine 1.1, hemoglobin  12.8, hematocrit 37.3. Serial cardiac enzymes negative x3. BNP 94.  Hemoglobin A1c 5.3.   MEDICATIONS AT DISCHARGE:  1. Arimidex 1 mg p.o. daily.  2. [name] 300 mg p.o. b.i.d.  3. Aspirin 81 mg p.o. daily.  4. Calcium 1200 mg p.o. b.i.d.  5. Fosamax once weekly as before.  6. Lopressor 25 mg p.o. b.i.d.  7. The patient instructed to hold lisinopril and Cozaar.   DISPOSITION/PLAN:  Transfer patient to home.   FOLLOW UP:  The patient is instructed to follow up with Dr. Alwyn Ren in 1-2  weeks and contact this office for appointment. She is also instructed to  monitor her cardiac cath site for swelling and to contact the cardiology  team should she notice swelling overlying cath site. She is also instructed  to limit activity for 48 hours per instructions by cardiology team.     ______________________________  Sandford Craze, NP      Raenette Rover. Felicity Coyer, MD  Electronically Signed    MO/MEDQ  D:  08/13/2006  T:  08/14/2006  Job:  [id number]   cc:   Titus Dubin. Alwyn Ren, MD,FACP,FCCP

## 2011-04-13 NOTE — Consult Note (Signed)
   NAME:  Crystal Bass, Crystal Bass                       ACCOUNT NO.:  [id number]   MEDICAL RECORD NO.:  [id number]                   PATIENT TYPE:  OUT   LOCATION:  GYN                                  [facility]AC[facility]   PHYSICIAN:  De Blanch, M.D.         DATE OF BIRTH:  01/04/1941   DATE OF CONSULTATION:  05/25/2003  DATE OF DISCHARGE:                                   CONSULTATION   REASON FOR CONSULTATION:  The patient is a 70 year old white female seen in  consultation at the request of Dr. Myrlene Broker.  The patient specifically  seeks information regarding a borderline tumors of the ovary.  The patient  underwent surgery for a complex adnexal mass on Apr 14, 2003.  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  She had a  bilateral salpingo-oophorectomy, hysterectomy, appendectomy, omentectomy,  and peritoneal washings.  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  The patient has had an uncomplicated postoperative course.   FAMILY HISTORY:  Family history of three other women with breast cancer.  The patient herself has a personal history of breast cancer.   I had a lengthy discussion with the patient regarding the natural history of  borderline tumors and indicated that the risk of occurrence is exceedingly  low.  We would recommend that there be follow up at six month intervals.  Given the fact that the patient had an elevated CA-125 preoperatively, I  would suggest that she have repeat CA-125's beginning at her first six month  check-up.  She will return to the care of Dr. Laqueta Linden for six month  interval check-up's, although I would be happy to see her if any problem or  question arises.                                                De Blanch, M.D.    DC/MEDQ  D:  05/25/2003  T:  05/25/2003  Job:  [id number]   cc:   Laqueta Linden,  M.D.  [address]  [place]  [place] [address]  Fax: [phone]   Telford Nab, R.N.  [address]  [place], [place] [address]

## 2011-04-17 ENCOUNTER — Encounter: Payer: Self-pay | Admitting: Internal Medicine

## 2011-04-17 ENCOUNTER — Ambulatory Visit (INDEPENDENT_AMBULATORY_CARE_PROVIDER_SITE_OTHER): Payer: Medicare Other | Admitting: Internal Medicine

## 2011-04-17 DIAGNOSIS — R05 Cough: Secondary | ICD-10-CM

## 2011-04-17 DIAGNOSIS — R0609 Other forms of dyspnea: Secondary | ICD-10-CM

## 2011-04-17 DIAGNOSIS — A319 Mycobacterial infection, unspecified: Secondary | ICD-10-CM

## 2011-04-17 DIAGNOSIS — R059 Cough, unspecified: Secondary | ICD-10-CM

## 2011-04-17 DIAGNOSIS — R0600 Dyspnea, unspecified: Secondary | ICD-10-CM

## 2011-04-17 NOTE — Assessment & Plan Note (Signed)
Classic Upper airway cough syndrome, so named because it's frequently impossible to sort out how much is  CR/sinusitis with freq throat clearing (which can be related to primary GERD)   vs  causing  secondary (" extra esophageal")  GERD from wide swings in gastric pressure that occur with throat clearing, often  promoting self use of mint and menthol lozenges that reduce the lower esophageal sphincter tone and exacerbate the problem further in a cyclical fashion.   These are the same pts who not infrequently have failed to tolerate ace inhibitors,  dry powder inhalers or biphosphonates or report having reflux symptoms that don't respond to standard doses of PPI , and are easily confused as having aecopd or asthma flares,  She's not convinced she has gerd and may be right > try off rx and see if flares

## 2011-04-17 NOTE — Patient Instructions (Addendum)
Try off acid suppression to see if it makes any difference but keep on the diet    If you feel that you are loosing ground with exercise tolerance you need to return to clinic

## 2011-04-17 NOTE — Progress Notes (Signed)
  Subjective:    Patient ID: Crystal Bass, female    DOB: 1941/10/18, 70 y.o.   MRN: [id number]  HPI   Subjective:    Patient ID: Crystal Bass, female    DOB: 05/21/1941, 70 y.o.   MRN: [id number]  HPI  53  yowf never smoker with h/o [name] referred to Pulmonary clinic by Dr Alwyn Ren for chronic cough.   03/13/2011 Initial pulmonary office eval in EMR era main c/o chronic cough (x years) and sob and worse x 3 weeks abruptly sob  Assoc with cough with green mucus which is a lot better p rx with augmentin  but breathing not better to her satisfaction since starting topamax with variable doe and resting sob and  having trouble talking.  No overt hb. No better on advair.  > rec stop advair, start high dose ppi/ pepcid hs and diet  03/28/2011 ov / Crystal Bass some better cough  But still sob with housework and walk at [employer], does not use [employer] parking. rec Continue Prilosec 20 mg Take 30-60 min before first meal of the day and Pepcid 20 mg one at bedtime  GERD (REFLUX) diet  04/17/2011 ov/Crystal Bass  Cc doe x years walking x 30 min x moderate pace. Cough resolved "because I stopped the topamax" Sleeping ok without nocturnal  or early am exac of resp c/o's.   Pt denies any significant sore throat, dysphagia, itching, sneezing,  nasal congestion or excess/ purulent secretions,  fever, chills, sweats, unintended wt loss, pleuritic or exertional cp, hempoptysis, orthopnea pnd or leg swelling.    Also denies any obvious fluctuation of symptoms with weather or environmental changes or other aggravating or alleviating factors.           Objective:      Assessment & Plan:    Review of Systems     Objective:   Physical Exam  amb hoarse wf with  No longer any  pseudowheeze  Wt  146 03/13/2011  > 144 03/28/2011 > 142 04/17/2011  HEENT mild turbinate edema.  Oropharynx no thrush or excess pnd or cobblestoning.  No JVD or cervical adenopathy. Mild accessory muscle hypertrophy. Trachea midline, nl  thryroid. Chest was hyperinflated by percussion with diminished breath sounds and moderate increased exp time without wheeze. Hoover sign positive at mid inspiration. Regular rate and rhythm without murmur gallop or rub or increase P2 or edema.  Abd: no hsm, nl excursion. Ext warm without cyanosis or clubbing.       CT chest 03/12/11 post op changes on R, scattered perbronch nodules and mild bronchiectasis Assessment & Plan:

## 2011-04-17 NOTE — Assessment & Plan Note (Signed)
No doubt she has low grad [name] but no indication for treatment at this point    Each maintenance medication was reviewed in detail including most importantly the difference between maintenance and as needed and under what circumstances the prns are to be used.  Please see instructions for details which were reviewed in writing and the patient given a copy.  See instructions for specific recommendations which were reviewed directly with the patient who was given a copy with highlighter outlining the key components.

## 2011-04-19 ENCOUNTER — Encounter: Payer: Self-pay | Admitting: Internal Medicine

## 2011-04-19 NOTE — Assessment & Plan Note (Addendum)
Not reproducible in office with very minimal airflow obstruction  Symptoms are markedly disproportionate to objective findings and not clear this is a lung problem but pt does appear to have difficult airway management issues.  DDX of  difficult airways managment all start with A and  include Adherence, Ace Inhibitors, Acid Reflux, Active Sinus Disease, Alpha 1 Antitripsin deficiency, Anxiety masquerading as Airways dz,  ABPA,  allergy(esp in young), Aspiration (esp in elderly), Adverse effects of DPI,  Active smokers, plus two Bs  = Bronchiectasis and Beta blocker use..and one C= CHF  ? Acid reflux > she's not convinced so ok to try a reverse therapeutic trial and see if any of her symptoms flare off rx    Each maintenance medication was reviewed in detail including most importantly the difference between maintenance and as needed and under what circumstances the prns are to be used.  Please see instructions for details which were reviewed in writing and the patient given a copy.  See instructions for specific recommendations which were reviewed directly with the patient who was given a copy with highlighter outlining the key components.

## 2011-07-20 ENCOUNTER — Other Ambulatory Visit: Payer: Self-pay | Admitting: Internal Medicine

## 2011-07-25 ENCOUNTER — Ambulatory Visit (INDEPENDENT_AMBULATORY_CARE_PROVIDER_SITE_OTHER): Payer: Medicare Other | Admitting: Internal Medicine

## 2011-07-25 ENCOUNTER — Encounter: Payer: Self-pay | Admitting: Internal Medicine

## 2011-07-25 DIAGNOSIS — I10 Essential (primary) hypertension: Secondary | ICD-10-CM

## 2011-07-25 DIAGNOSIS — R002 Palpitations: Secondary | ICD-10-CM

## 2011-07-25 DIAGNOSIS — J449 Chronic obstructive pulmonary disease, unspecified: Secondary | ICD-10-CM

## 2011-07-25 MED ORDER — TIOTROPIUM BROMIDE MONOHYDRATE 18 MCG IN CAPS: 18.0000 ug | ORAL_CAPSULE | Freq: Every day | RESPIRATORY_TRACT | Status: DC

## 2011-07-25 MED ORDER — LOSARTAN POTASSIUM 50 MG PO TABS: 50.0000 mg | ORAL_TABLET | Freq: Every day | ORAL | Status: DC

## 2011-07-25 NOTE — Patient Instructions (Signed)
Used the  sample of Spiriva over the next 10 days to see if it will help the exertional dyspnea or shortness of breath. To prevent palpitations or premature beats, avoid stimulants such as decongestants, diet pills, nicotine, or caffeine (coffee, tea, cola, or chocolate) to excess. Blood Pressure Goal  Ideally is an AVERAGE < 135/85. This AVERAGE should be calculated from @ least 5-7 BP readings taken @ different times of day on different days of week. You should not respond to isolated BP readings , but rather the AVERAGE for that week

## 2011-07-25 NOTE — Progress Notes (Signed)
  Subjective:    Patient ID: Crystal Bass, female    DOB: 24-Sep-1941, 70 y.o.   MRN: [id number]  HPI  HYPERTENSION: Disease Monitoring  Blood pressure range: 120-130/70-80  Chest pain: no              Palpitations:  "heart banging against chest wall 8/24 for 10 beats", minimally , intermittently since             Dyspnea: yes, intermittently , especially with exertion, improved since late Spring & stable compared to last year. A trial of Advair was not beneficial.              Claudication: no   Medication compliance: yes  Medication Side Effects  Lightheadedness: occasionally , positionally  Urinary frequency: no   Edema: not significantly    Preventitive Healthcare:  Exercise: no   Diet Pattern: no plan  Salt Restriction: yes      Review of Systems     Objective:   Physical Exam General appearance is of good health and nourishment; no acute distress or increased work of breathing is present.  No  lymphadenopathy about the head, neck, or axilla noted.   Eyes: No conjunctival inflammation or lid edema is present. Nose:  External nasal examination shows no deformity or inflammation. Nasal mucosa are pink and moist without lesions or exudates. No septal dislocation or dislocation.No obstruction to airflow.   Oral exam: Dental hygiene is good; lips and gums are healthy appearing.There is no oropharyngeal erythema or exudate noted.   Neck:  No deformities, thyromegaly, masses, or tenderness noted.     Heart:  Normal rate and regular rhythm. S1 and S2 normal without gallop, murmur, click, rub .S4 with slight slurring .  Lungs:Chest : inspiratory pops & low grade musical wheezes; no rhonchi,rales ,or rubs present.No increased work of breathing @  rest.    Extremities:  No cyanosis, edema, or clubbing  noted    Skin: Warm & dry           Assessment & Plan:  #1 hypertension, home readings indicate excellent control  #2  Isolated  tachyarrhythmias/palpitations  #3  dyspnea in the context of clinical reactive airways disease. Subjectively Advair with no benefit.

## 2011-07-26 LAB — POTASSIUM: Potassium: 4.4 mEq/L (ref 3.5–5.1)

## 2011-07-26 LAB — MAGNESIUM: Magnesium: 2.4 mg/dL (ref 1.5–2.5)

## 2011-07-26 LAB — CALCIUM: Calcium: 9.8 mg/dL (ref 8.4–10.5)

## 2011-07-26 LAB — TSH: TSH: 1.08 u[IU]/mL (ref 0.35–5.50)

## 2011-08-01 ENCOUNTER — Encounter: Payer: Self-pay | Admitting: Internal Medicine

## 2011-09-10 ENCOUNTER — Other Ambulatory Visit: Payer: Self-pay | Admitting: Oncology

## 2011-09-10 ENCOUNTER — Ambulatory Visit (HOSPITAL_COMMUNITY)
Admission: RE | Admit: 2011-09-10 | Discharge: 2011-09-10 | Disposition: A | Discharge status: Home / Self Care | Payer: Medicare Other | Source: Ambulatory Visit | Attending: Oncology | Admitting: Oncology

## 2011-09-10 ENCOUNTER — Encounter (HOSPITAL_BASED_OUTPATIENT_CLINIC_OR_DEPARTMENT_OTHER): Payer: BC Managed Care – PPO | Admitting: Oncology

## 2011-09-10 DIAGNOSIS — C50419 Malignant neoplasm of upper-outer quadrant of unspecified female breast: Secondary | ICD-10-CM

## 2011-09-10 DIAGNOSIS — C801 Malignant (primary) neoplasm, unspecified: Secondary | ICD-10-CM

## 2011-09-10 DIAGNOSIS — A310 Pulmonary mycobacterial infection: Secondary | ICD-10-CM

## 2011-09-10 DIAGNOSIS — R071 Chest pain on breathing: Secondary | ICD-10-CM

## 2011-09-10 DIAGNOSIS — Z8543 Personal history of malignant neoplasm of ovary: Secondary | ICD-10-CM

## 2011-09-10 DIAGNOSIS — C50919 Malignant neoplasm of unspecified site of unspecified female breast: Secondary | ICD-10-CM | POA: Insufficient documentation

## 2011-09-10 DIAGNOSIS — J449 Chronic obstructive pulmonary disease, unspecified: Secondary | ICD-10-CM | POA: Insufficient documentation

## 2011-09-10 DIAGNOSIS — J4489 Other specified chronic obstructive pulmonary disease: Secondary | ICD-10-CM | POA: Insufficient documentation

## 2011-09-10 LAB — CBC WITH DIFFERENTIAL/PLATELET
BASO%: 0.6 % (ref 0.0–2.0)
EOS%: 3.1 % (ref 0.0–7.0)
MCH: 31.3 pg (ref 25.1–34.0)
MCHC: 33.6 g/dL (ref 31.5–36.0)
NEUT%: 64.4 % (ref 38.4–76.8)
RBC: 4.2 10*6/uL (ref 3.70–5.45)
RDW: 14.5 % (ref 11.2–14.5)
lymph#: 1.3 10*3/uL (ref 0.9–3.3)

## 2011-09-10 LAB — COMPREHENSIVE METABOLIC PANEL
ALT: 13 U/L (ref 0–35)
AST: 18 U/L (ref 0–37)
Calcium: 9.3 mg/dL (ref 8.4–10.5)
Chloride: 103 mEq/L (ref 96–112)
Creatinine, Ser: 0.82 mg/dL (ref 0.50–1.10)
Potassium: 3.7 mEq/L (ref 3.5–5.3)

## 2011-09-11 ENCOUNTER — Ambulatory Visit (INDEPENDENT_AMBULATORY_CARE_PROVIDER_SITE_OTHER): Payer: Medicare Other

## 2011-09-11 DIAGNOSIS — Z23 Encounter for immunization: Secondary | ICD-10-CM

## 2011-09-18 ENCOUNTER — Other Ambulatory Visit: Payer: Self-pay | Admitting: Oncology

## 2011-09-18 ENCOUNTER — Encounter (HOSPITAL_BASED_OUTPATIENT_CLINIC_OR_DEPARTMENT_OTHER): Payer: Medicare Other | Admitting: Oncology

## 2011-09-18 DIAGNOSIS — C50419 Malignant neoplasm of upper-outer quadrant of unspecified female breast: Secondary | ICD-10-CM

## 2011-09-18 DIAGNOSIS — A310 Pulmonary mycobacterial infection: Secondary | ICD-10-CM

## 2011-09-18 DIAGNOSIS — Z1231 Encounter for screening mammogram for malignant neoplasm of breast: Secondary | ICD-10-CM

## 2011-09-18 DIAGNOSIS — Z8543 Personal history of malignant neoplasm of ovary: Secondary | ICD-10-CM

## 2011-09-18 DIAGNOSIS — C569 Malignant neoplasm of unspecified ovary: Secondary | ICD-10-CM

## 2011-09-20 ENCOUNTER — Other Ambulatory Visit: Payer: Self-pay | Admitting: Internal Medicine

## 2011-09-24 ENCOUNTER — Ambulatory Visit
Admission: RE | Admit: 2011-09-24 | Discharge: 2011-09-24 | Disposition: A | Discharge status: Home / Self Care | Payer: Medicare Other | Source: Ambulatory Visit | Attending: Oncology | Admitting: Oncology

## 2011-09-24 ENCOUNTER — Telehealth: Payer: Self-pay | Admitting: Internal Medicine

## 2011-09-24 DIAGNOSIS — C569 Malignant neoplasm of unspecified ovary: Secondary | ICD-10-CM

## 2011-09-24 MED ORDER — IOHEXOL 300 MG/ML  SOLN
75.0000 mL | Freq: Once | INTRAMUSCULAR | Status: AC | PRN
  Administered 2011-09-24: 75 mL via INTRAVENOUS

## 2011-09-24 NOTE — Telephone Encounter (Signed)
Per MW, this pt needs a followup appt in 2 weeks. Called and advised pt this. She states that she will have to call me back to schedule this. I will hold in my box until she calls back

## 2011-10-08 ENCOUNTER — Ambulatory Visit
Admission: RE | Admit: 2011-10-08 | Discharge: 2011-10-08 | Disposition: A | Discharge status: Home / Self Care | Payer: Medicare Other | Source: Ambulatory Visit | Attending: Oncology | Admitting: Oncology

## 2011-10-08 DIAGNOSIS — Z1231 Encounter for screening mammogram for malignant neoplasm of breast: Secondary | ICD-10-CM

## 2011-10-09 NOTE — Telephone Encounter (Signed)
Still has not sched ov yet-lmtcb

## 2011-10-26 ENCOUNTER — Other Ambulatory Visit: Payer: Self-pay | Admitting: Internal Medicine

## 2011-11-22 ENCOUNTER — Other Ambulatory Visit: Payer: Self-pay | Admitting: Internal Medicine

## 2011-12-24 ENCOUNTER — Other Ambulatory Visit: Payer: Self-pay | Admitting: Internal Medicine

## 2011-12-27 ENCOUNTER — Other Ambulatory Visit: Payer: Self-pay | Admitting: Internal Medicine

## 2012-01-01 ENCOUNTER — Telehealth: Payer: Self-pay | Admitting: Oncology

## 2012-01-01 NOTE — Telephone Encounter (Signed)
Called pt, left message regarding appt on 03/11/12, lab and Chest x-ray, order been faxed to [facility] radiology. Pt will see MD on 03/14/12

## 2012-01-16 ENCOUNTER — Other Ambulatory Visit: Payer: Self-pay | Admitting: Internal Medicine

## 2012-01-16 NOTE — Telephone Encounter (Signed)
RX was sent in for a year supply on 07/25/2011, patient not due for another refill until 06/2012. I called the pharmacy and they state this was sent to [place] in error, rx was received in 06/2011.

## 2012-01-23 ENCOUNTER — Other Ambulatory Visit: Payer: Self-pay | Admitting: Internal Medicine

## 2012-01-23 NOTE — Telephone Encounter (Signed)
Prescriptions sent to pharmacy

## 2012-02-12 ENCOUNTER — Telehealth (INDEPENDENT_AMBULATORY_CARE_PROVIDER_SITE_OTHER): Payer: Self-pay

## 2012-02-12 NOTE — Telephone Encounter (Signed)
Crystal Bass called in saying she is pt of Crystal Bass. He has done GB and masty on her in the past. She has felt a lump and wants to be seen. I told patient he was on vacation and that I can possibly squeeze her in on March 27th around 2 when he comes over to see 1 patient. I will have to get his input on when he can squeeze her in. I will call her next Wednesday.

## 2012-02-13 ENCOUNTER — Other Ambulatory Visit: Payer: Self-pay | Admitting: Obstetrics and Gynecology

## 2012-02-13 ENCOUNTER — Other Ambulatory Visit: Payer: Self-pay | Admitting: Oncology

## 2012-02-13 DIAGNOSIS — N6311 Unspecified lump in the right breast, upper outer quadrant: Secondary | ICD-10-CM

## 2012-02-19 ENCOUNTER — Ambulatory Visit
Admission: RE | Admit: 2012-02-19 | Discharge: 2012-02-19 | Disposition: A | Discharge status: Home / Self Care | Payer: Medicare Other | Source: Ambulatory Visit | Attending: Obstetrics and Gynecology | Admitting: Obstetrics and Gynecology

## 2012-02-19 DIAGNOSIS — N6311 Unspecified lump in the right breast, upper outer quadrant: Secondary | ICD-10-CM

## 2012-02-28 ENCOUNTER — Encounter: Payer: Self-pay | Admitting: Internal Medicine

## 2012-02-28 ENCOUNTER — Ambulatory Visit: Payer: Medicare Other | Admitting: Internal Medicine

## 2012-02-28 VITALS — BP 130/82 | HR 65

## 2012-02-28 DIAGNOSIS — A319 Mycobacterial infection, unspecified: Secondary | ICD-10-CM

## 2012-02-28 DIAGNOSIS — J449 Chronic obstructive pulmonary disease, unspecified: Secondary | ICD-10-CM

## 2012-02-28 NOTE — Patient Instructions (Signed)
Please bring Crystal Bass to see me before refilling meds.

## 2012-02-28 NOTE — Progress Notes (Signed)
  Subjective:    Patient ID: Crystal Bass, female    DOB: 1941/06/01, 71 y.o.   MRN: [id number]  HPI Crystal Bass came in to discuss her mother in law's  health & mental status. She has her mother-in-law's healthcare power of attorney and the conversation can occur. I did explain to Crystal Bass that I cannot bill Medicare for this discussion nor can I apply charges  to [employer] as this would be considered fraud by Medicare.  Her mother-in-law is Crystal Bass, date of birth 10/30/17. Her last office visit was 12/25/2010. Those records are in [facility]  & there is no means for me to make an addendum as she is not entered into the Epic system.  Although she is 18; probably medical treatment has been at the wound care center since 12/25/10. She is not been in the emergency room or in the hospital.  She is concerned is Crystal Bass's significant disorientation "half the time". She is concerned if she were to go hospital what would happen to her mother-in-law.  The only real options as they're no other family members to provide care would be a consultation with [facility] or involvement in the [facility]. Finally  placement in a nursing facility temporarily  were [facility] hospitalized is an option.  I will need to see Crystal Bass before refilling her medicines; neurology referral is not possible without me assessing her  present status.     Review of Systems     Objective:   Physical Exam Oriented & focused . Affect slightly flat but appropriate.  She is realistic about the possibility of possible need for re-hospitalization in view of her pulmonary issues.  She wants to be proactive in providing care for her mother-in -law         Assessment & Plan:  #1 situational stress I have recommended [facility] assessment of care opportunities for Crystal Bass if Crystal Bass requires hospitalization.Realistically the most appropriate option would be temporary nursing home placement as  Crystal Bass requires 24 hour supervision & significant direct daily care

## 2012-03-11 ENCOUNTER — Other Ambulatory Visit (HOSPITAL_BASED_OUTPATIENT_CLINIC_OR_DEPARTMENT_OTHER): Payer: Medicare Other | Admitting: Lab

## 2012-03-11 ENCOUNTER — Ambulatory Visit (HOSPITAL_COMMUNITY)
Admission: RE | Admit: 2012-03-11 | Discharge: 2012-03-11 | Disposition: A | Discharge status: Home / Self Care | Payer: Medicare Other | Source: Ambulatory Visit | Attending: Oncology | Admitting: Oncology

## 2012-03-11 ENCOUNTER — Other Ambulatory Visit: Payer: Self-pay | Admitting: Oncology

## 2012-03-11 DIAGNOSIS — C801 Malignant (primary) neoplasm, unspecified: Secondary | ICD-10-CM

## 2012-03-11 DIAGNOSIS — Z9889 Other specified postprocedural states: Secondary | ICD-10-CM | POA: Insufficient documentation

## 2012-03-11 DIAGNOSIS — J438 Other emphysema: Secondary | ICD-10-CM | POA: Insufficient documentation

## 2012-03-11 DIAGNOSIS — Z8543 Personal history of malignant neoplasm of ovary: Secondary | ICD-10-CM

## 2012-03-11 DIAGNOSIS — C50419 Malignant neoplasm of upper-outer quadrant of unspecified female breast: Secondary | ICD-10-CM

## 2012-03-11 DIAGNOSIS — R071 Chest pain on breathing: Secondary | ICD-10-CM

## 2012-03-11 DIAGNOSIS — Z170 Estrogen receptor positive status [ER+]: Secondary | ICD-10-CM

## 2012-03-11 DIAGNOSIS — A310 Pulmonary mycobacterial infection: Secondary | ICD-10-CM

## 2012-03-11 DIAGNOSIS — Z853 Personal history of malignant neoplasm of breast: Secondary | ICD-10-CM | POA: Insufficient documentation

## 2012-03-11 LAB — LACTATE DEHYDROGENASE: LDH: 128 U/L (ref 94–250)

## 2012-03-11 LAB — CBC WITH DIFFERENTIAL/PLATELET
Basophils Absolute: 0 10*3/uL (ref 0.0–0.1)
EOS%: 5.3 % (ref 0.0–7.0)
HGB: 13.6 g/dL (ref 11.6–15.9)
LYMPH%: 25.7 % (ref 14.0–49.7)
MCH: 31.2 pg (ref 25.1–34.0)
MCV: 93.8 fL (ref 79.5–101.0)
MONO%: 9.6 % (ref 0.0–14.0)
NEUT%: 58.7 % (ref 38.4–76.8)
Platelets: 181 10*3/uL (ref 145–400)
RDW: 14 % (ref 11.2–14.5)

## 2012-03-11 LAB — COMPREHENSIVE METABOLIC PANEL
Alkaline Phosphatase: 70 U/L (ref 39–117)
BUN: 24 mg/dL — ABNORMAL HIGH (ref 6–23)
Creatinine, Ser: 0.8 mg/dL (ref 0.50–1.10)
Glucose, Bld: 77 mg/dL (ref 70–99)
Sodium: 140 mEq/L (ref 135–145)
Total Bilirubin: 0.7 mg/dL (ref 0.3–1.2)

## 2012-03-14 ENCOUNTER — Telehealth: Payer: Self-pay | Admitting: Oncology

## 2012-03-14 ENCOUNTER — Ambulatory Visit (HOSPITAL_BASED_OUTPATIENT_CLINIC_OR_DEPARTMENT_OTHER): Payer: Medicare Other | Admitting: Oncology

## 2012-03-14 ENCOUNTER — Encounter: Payer: Self-pay | Admitting: Oncology

## 2012-03-14 VITALS — BP 146/76 | HR 57 | Temp 97.3°F | Ht 63.0 in | Wt 141.7 lb

## 2012-03-14 DIAGNOSIS — C50919 Malignant neoplasm of unspecified site of unspecified female breast: Secondary | ICD-10-CM

## 2012-03-14 DIAGNOSIS — A310 Pulmonary mycobacterial infection: Secondary | ICD-10-CM

## 2012-03-14 DIAGNOSIS — J449 Chronic obstructive pulmonary disease, unspecified: Secondary | ICD-10-CM

## 2012-03-14 DIAGNOSIS — M810 Age-related osteoporosis without current pathological fracture: Secondary | ICD-10-CM

## 2012-03-14 DIAGNOSIS — Z853 Personal history of malignant neoplasm of breast: Secondary | ICD-10-CM

## 2012-03-14 HISTORY — DX: Age-related osteoporosis without current pathological fracture: M81.0

## 2012-03-14 NOTE — Progress Notes (Signed)
Hematology and Oncology Follow Up Visit  Crystal Bass [id number] December 31, 1940 71 y.o. 03/14/2012 2:56 PM   Principle Diagnosis: Encounter Diagnoses  Name Primary?  . Breast cancer, stage 1, estrogen receptor positive Yes  . Pulmonary [name] (mycobacterium avium-intracellulare) infection   . Osteoporosis, post-menopausal   . COPD (chronic obstructive pulmonary disease) with chronic bronchitis      Interim History:   Followup visit for this 71 year old woman with a history of stage I ER positive cancer of the left breast diagnosed in January 2002. She was treated with a left modified radical mastectomy than initial tamoxifen hormonal therapy and then transitioned to an aromatase inhibitor which she took through 2012.[name]  She had a single small pulmonary lesion that was positive on a PET scan at time of diagnosis..  It was surgically resected and found to be an area of Mycobacterium avium tuberculosis.  In fact, a CT scan of the chest shows multiple bilateral areas of nodular pulmonary infiltrates which have fluctuated over time.  She has been treated on at least 2 or 3 occasions for [name].  She has a chronic cough, and I would venture to guess that she has bronchiectasis.  She is not immunocompromised as far as we can tell.     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   She has had no interim medical problems. She has a chronic bronchitis. She has chronic migraine headaches no change in the pattern or frequency. She has chronic constipation but no change in her regular bowel habit. No vaginal bleeding. No bone pain other than a chronic pain localized to an area on her left tibia    Medications: reviewed  Allergies:  Allergies  Allergen Reactions  . Sulfonamide Derivatives     REACTION: anaphylaxis  . Hydrocodone Other (See Comments)    "hyper and climbing the walls"  . Tetracyclines & Related      "immediate yeast infection"    Review of Systems: Constitutional:  Chronic fatigue related to obstructive airway disease  Respiratory: See above Cardiovascular: No chest pain or palpitations  Gastrointestinal: See above Genito-Urinary: See above Musculoskeletal: See above Neurologic: See above Skin: No rash Remaining ROS negative.  Physical Exam: Blood pressure 146/76, pulse 57, temperature 97.3 F (36.3 C), temperature source Oral, height 5\' 3"  (1.6 m), weight 141 lb 11.2 oz (64.275 kg). Wt Readings from Last 3 Encounters:  03/14/12 141 lb 11.2 oz (64.275 kg)  07/25/11 144 lb 6.4 oz (65.499 kg)  04/17/11 142 lb (64.411 kg)     General appearance: Well-nourished Caucasian woman HENNT: Pharynx no erythema or exudate Lymph nodes: No cervical supraclavicular or axillary adenopathy Breasts: Left mastectomy no right breast masses Lungs: Clear to auscultation resonant to percussion Heart: Regular rhythm no murmur Abdomen: Soft nontender no mass no organomegaly Extremities: No edema no calf tenderness Vascular: No cyanosis Neurologic: Motor strength 5 over 5 reflexes 1+ symmetric Skin: No rash no ecchymosis  Lab Results: Lab Results  Component Value Date   WBC 6.4 03/11/2012   HGB 13.6 03/11/2012   HCT 40.7 03/11/2012   MCV 93.8 03/11/2012   PLT 181 03/11/2012     Chemistry      Component Value Date/Time   NA 140 03/11/2012 1346   K 4.2 03/11/2012 1346   CL 106 03/11/2012 1346   CO2 28 03/11/2012 1346   BUN 24* 03/11/2012 1346   CREATININE 0.80  03/11/2012 1346      Component Value Date/Time   CALCIUM 9.5 03/11/2012 1346   ALKPHOS 70 03/11/2012 1346   AST 18 03/11/2012 1346   ALT 13 03/11/2012 1346   BILITOT 0.7 03/11/2012 1346       Radiological Studies: Chest radiograph 03/11/12 and is in which I personally reviewed shows chronic bilateral patchy infiltrates which on CT scan are areas of nodularity from chronic [name] infection    Impression and Plan: #1. Stage I ER  positive breast cancer treated as outlined above. She remains free of any obvious recurrence now out over 11 years from diagnosis.  #2. 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 Chronic nodular pulmonary infiltrates. I'm now getting chest x-rays every 6 months and CT scans once a year.  #3. Chronic bronchitis/bronchiectasis secondary to #2  #4. 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 Migraine headaches  #6. Ovarian cancer of low malignant potential status post hysterectomy plus oophorectomy May 2004  #7. Essential hypertension   CC:. Dr. Marga Melnick; Dr. Cliffton Asters; Dr. Coralyn Helling   Levert Feinstein, MD 4/19/20132:56 PM

## 2012-03-14 NOTE — Telephone Encounter (Signed)
gv pt appt schedule for April/oct including ct for 10/15 @ [facility]. Per myrtle zometa should be next wk and again in 6mos.

## 2012-03-19 ENCOUNTER — Ambulatory Visit (HOSPITAL_BASED_OUTPATIENT_CLINIC_OR_DEPARTMENT_OTHER): Payer: Medicare Other

## 2012-03-19 VITALS — BP 154/83 | HR 64 | Temp 97.6°F

## 2012-03-19 DIAGNOSIS — M810 Age-related osteoporosis without current pathological fracture: Secondary | ICD-10-CM

## 2012-03-19 MED ORDER — ZOLEDRONIC ACID 4 MG/100ML IV SOLN
4.0000 mg | Freq: Once | INTRAVENOUS | Status: AC
  Administered 2012-03-19: 4 mg via INTRAVENOUS
  Filled 2012-03-19: qty 100

## 2012-03-19 NOTE — Patient Instructions (Signed)
[facility] Discharge Instructions for Patients Receiving Chemotherapy  Today you received the following chemotherapy agent, Zometa  To help prevent nausea and vomiting after your treatment, we encourage you to take your nausea medication as instructed by your physician. Begin taking it as prescribed.   If you develop nausea and vomiting that is not controlled by your nausea medication, call the clinic. If it is after clinic hours your family physician or the after hours number for the clinic or go to the Emergency Department.   BELOW ARE SYMPTOMS THAT SHOULD BE REPORTED IMMEDIATELY:  *FEVER GREATER THAN 100.5 F  *CHILLS WITH OR WITHOUT FEVER  NAUSEA AND VOMITING THAT IS NOT CONTROLLED WITH YOUR NAUSEA MEDICATION  *UNUSUAL SHORTNESS OF BREATH  *UNUSUAL BRUISING OR BLEEDING  TENDERNESS IN MOUTH AND THROAT WITH OR WITHOUT PRESENCE OF ULCERS  *URINARY PROBLEMS  *BOWEL PROBLEMS  UNUSUAL RASH Items with * indicate a potential emergency and should be followed up as soon as possible.  One of the nurses will contact you 24 hours after your treatment. Please let the nurse know about any problems that you may have experienced. Feel free to call the clinic you have any questions or concerns. The clinic phone number is [phone].   I have been informed and understand all the instructions given to me. I know to contact the clinic, my physician, or go to the Emergency Department if any problems should occur. I do not have any questions at this time, but understand that I may call the clinic during office hours   should I have any questions or need assistance in obtaining follow up care.    __________________________________________  _____________  __________ Signature of Patient or Authorized Representative            Date                   Time    __________________________________________ Nurse's Signature

## 2012-03-21 ENCOUNTER — Telehealth: Payer: Self-pay | Admitting: Internal Medicine

## 2012-03-21 NOTE — Telephone Encounter (Signed)
Refill for Gabapentin 300MG  Capsule Requesting 90-day supply  Last written 2.27.13, as take 1 to 3 capsules by mouth every 8-hours as needed Last qty 180 Last OV 4.4.13

## 2012-03-21 NOTE — Telephone Encounter (Signed)
I called pharmacy(Spoke with Neysa Bonito) and they stated there corp office  will send out request sometime and this request is not needed right now as medication was filled on 01/23/12 #180 with 4 refills

## 2012-03-24 NOTE — Telephone Encounter (Signed)
Received a 2nd request from [facility] today for the below, would you like for me to fax the note below back to them?

## 2012-03-24 NOTE — Telephone Encounter (Signed)
Faxed off to Pharmacy

## 2012-03-24 NOTE — Telephone Encounter (Signed)
Yes

## 2012-06-23 ENCOUNTER — Other Ambulatory Visit: Payer: Self-pay | Admitting: Internal Medicine

## 2012-08-14 ENCOUNTER — Other Ambulatory Visit: Payer: Self-pay | Admitting: Internal Medicine

## 2012-08-14 ENCOUNTER — Other Ambulatory Visit: Payer: Self-pay | Admitting: General Practice

## 2012-08-14 DIAGNOSIS — I10 Essential (primary) hypertension: Secondary | ICD-10-CM

## 2012-08-14 MED ORDER — LOSARTAN POTASSIUM 50 MG PO TABS: 50.0000 mg | ORAL_TABLET | Freq: Every day | ORAL | Status: DC

## 2012-08-15 ENCOUNTER — Encounter: Payer: Self-pay | Admitting: Adult Health

## 2012-08-15 ENCOUNTER — Ambulatory Visit (INDEPENDENT_AMBULATORY_CARE_PROVIDER_SITE_OTHER): Payer: Medicare Other | Admitting: Adult Health

## 2012-08-15 ENCOUNTER — Telehealth: Payer: Self-pay | Admitting: Internal Medicine

## 2012-08-15 ENCOUNTER — Other Ambulatory Visit: Payer: Self-pay | Admitting: Internal Medicine

## 2012-08-15 ENCOUNTER — Ambulatory Visit (INDEPENDENT_AMBULATORY_CARE_PROVIDER_SITE_OTHER)
Admission: RE | Admit: 2012-08-15 | Discharge: 2012-08-15 | Disposition: A | Discharge status: Home / Self Care | Payer: Medicare Other | Source: Ambulatory Visit | Attending: Adult Health | Admitting: Adult Health

## 2012-08-15 VITALS — BP 124/74 | HR 60 | Ht 63.0 in | Wt 142.0 lb

## 2012-08-15 DIAGNOSIS — J209 Acute bronchitis, unspecified: Secondary | ICD-10-CM

## 2012-08-15 DIAGNOSIS — R0602 Shortness of breath: Secondary | ICD-10-CM

## 2012-08-15 MED ORDER — LEVALBUTEROL HCL 0.63 MG/3ML IN NEBU
0.6300 mg | INHALATION_SOLUTION | Freq: Once | RESPIRATORY_TRACT | Status: AC
  Administered 2012-08-15: 0.63 mg via RESPIRATORY_TRACT

## 2012-08-15 MED ORDER — LEVALBUTEROL HCL 0.63 MG/3ML IN NEBU: 1.0000 | INHALATION_SOLUTION | RESPIRATORY_TRACT | Status: DC | PRN

## 2012-08-15 NOTE — Assessment & Plan Note (Signed)
Flare   cxr pending   Plan  Augmentin as directed  Mucinex DM Twice daily  As needed  Cough/congestion  Fluids and rest  follow up 4 weeks and As needed   I will call with xray results.  Please contact office for sooner follow up if symptoms do not improve or worsen or seek emergency care

## 2012-08-15 NOTE — Telephone Encounter (Signed)
LMTCB-need to let pt know about protocol of changing MD's.

## 2012-08-15 NOTE — Patient Instructions (Addendum)
Take Augmentin as directed  Mucinex DM Twice daily  As needed  Cough/congestion  Fluids and rest  follow up 4 weeks and As needed   I will call with xray results.  Please contact office for sooner follow up if symptoms do not improve or worsen or seek emergency care

## 2012-08-15 NOTE — Progress Notes (Signed)
Subjective:    Patient ID: Crystal Bass, female    DOB: May 12, 1941, 71 y.o.   MRN: [id number] HPI  31  yowf never smoker with h/o [name] referred to Pulmonary clinic by Dr Alwyn Ren for chronic cough.  03/13/2011 Initial pulmonary office eval in EMR era main c/o chronic cough (x years) and sob and worse x 3 weeks abruptly sob  Assoc with cough with green mucus which is a lot better p rx with augmentin  but breathing not better to her satisfaction since starting topamax with variable doe and resting sob and  having trouble talking.  No overt hb. No better on advair.  > rec stop advair, start high dose ppi/ pepcid hs and diet  03/28/2011 ov / Wert some better cough  But still sob with housework and walk at [employer], does not use [facility] parking. rec Continue Prilosec 20 mg Take 30-60 min before first meal of the day and Pepcid 20 mg one at bedtime  GERD (REFLUX) diet  04/17/2011 ov/Wert  Cc doe x years walking x 30 min x moderate pace. Cough resolved "because I stopped the topamax" Sleeping ok without nocturnal  or early am exac of resp c/o's. >>d/c ppi   08/15/2012 Acute OV  Complains of prod cough with yellow mucus, increased SOB x2 weeks.  Was given augmentin by PCP yesterday. Has not started this yet .  Not seen for > 1 year , doing okay w/out flares   no otc used.  No fever or hemoptysis . No chest pain  No recent travel or abx uses   Review of Systems Constitutional:   No  weight loss, night sweats,  Fevers, chills,  +fatigue, or  lassitude.  HEENT:   No headaches,  Difficulty swallowing,  Tooth/dental problems, or  Sore throat,                No sneezing, itching, ear ache, nasal congestion, post nasal drip,   CV:  No chest pain,  Orthopnea, PND, swelling in lower extremities, anasarca, dizziness, palpitations, syncope.   GI  No heartburn, indigestion, abdominal pain, nausea, vomiting, diarrhea, change in bowel habits, loss of appetite, bloody stools.   Resp:    No coughing up of  blood.     No chest wall deformity  Skin: no rash or lesions.  GU: no dysuria, change in color of urine, no urgency or frequency.  No flank pain, no hematuria   MS:  No joint pain or swelling.  No decreased range of motion.  No back pain.  Psych:  No change in mood or affect. No depression or anxiety.  No memory loss.         Objective:   Physical Exam  amb hoarse wf  Wt  146 03/13/2011  > 144 03/28/2011 > 142 04/17/2011 >08/15/2012 142   GEN: A/Ox3; pleasant , NAD, well nourished   HEENT:  [place]/AT,  EACs-clear, TMs-wnl, NOSE-clear, THROAT-clear, no lesions, no postnasal drip or exudate noted.   NECK:  Supple w/ fair ROM; no JVD; normal carotid impulses w/o bruits; no thyromegaly or nodules palpated; no lymphadenopathy.  RESP  Coarse BS w/ few rhonchi no accessory muscle use, no dullness to percussion  CARD:  RRR, no m/r/g  , no peripheral edema, pulses intact, no cyanosis or clubbing.  GI:   Soft & nt; nml bowel sounds; no organomegaly or masses detected.  Musco: Warm bil, no deformities or joint swelling noted.   Neuro: alert, no focal deficits  noted.    Skin: Warm, no lesions or rashes    CXR 4/13 COPD/emphysema  Stable postoperative changes involving the right lung.   CT chest 03/12/11 post op changes on R, scattered perbronch nodules and mild bronchiectasis Assessment & Plan:

## 2012-08-18 NOTE — Progress Notes (Signed)
Quick Note:  Called spoke with patient, advised of cxr results / recs as stated by TP. Pt verbalized her understanding and denied any questions. ______ 

## 2012-08-18 NOTE — Telephone Encounter (Signed)
I spoke with the pt and advised of protocol. Dr. Sherene Sires please advise.Carron Curie, CMA

## 2012-08-18 NOTE — Telephone Encounter (Signed)
Pt returned triage's call.  Holly D Pryor ° °

## 2012-08-18 NOTE — Telephone Encounter (Signed)
Fine with me but note she is Dr Frederik Pear patient and from the notes I see no indication for further w/u or rx here  Unless Alfonse Flavors feels it's necessary

## 2012-08-22 NOTE — Telephone Encounter (Signed)
Pt returned cal from triage. Per [name] I have scheduled pt for a consult with MR (next avail in Oct). Nothing further needed per pt and I will close encounter. Crystal Bass

## 2012-08-22 NOTE — Telephone Encounter (Signed)
MR, pls advise.  Thank you. 

## 2012-08-22 NOTE — Telephone Encounter (Signed)
Fine to see me-

## 2012-08-22 NOTE — Telephone Encounter (Signed)
lmomtcb x1 

## 2012-08-27 ENCOUNTER — Encounter: Payer: Self-pay | Admitting: Internal Medicine

## 2012-09-09 ENCOUNTER — Other Ambulatory Visit (HOSPITAL_BASED_OUTPATIENT_CLINIC_OR_DEPARTMENT_OTHER): Payer: Medicare Other | Admitting: Lab

## 2012-09-09 ENCOUNTER — Ambulatory Visit
Admission: RE | Admit: 2012-09-09 | Discharge: 2012-09-09 | Disposition: A | Discharge status: Home / Self Care | Payer: Medicare Other | Source: Ambulatory Visit | Attending: Oncology | Admitting: Oncology

## 2012-09-09 DIAGNOSIS — C50919 Malignant neoplasm of unspecified site of unspecified female breast: Secondary | ICD-10-CM

## 2012-09-09 DIAGNOSIS — Z170 Estrogen receptor positive status [ER+]: Secondary | ICD-10-CM

## 2012-09-09 DIAGNOSIS — M810 Age-related osteoporosis without current pathological fracture: Secondary | ICD-10-CM

## 2012-09-09 LAB — CBC WITH DIFFERENTIAL/PLATELET
BASO%: 0.9 % (ref 0.0–2.0)
EOS%: 7.3 % — ABNORMAL HIGH (ref 0.0–7.0)
LYMPH%: 18.9 % (ref 14.0–49.7)
MCHC: 33.9 g/dL (ref 31.5–36.0)
MCV: 95.1 fL (ref 79.5–101.0)
MONO%: 9.2 % (ref 0.0–14.0)
Platelets: 220 10*3/uL (ref 145–400)
RBC: 4.07 10*6/uL (ref 3.70–5.45)

## 2012-09-09 LAB — COMPREHENSIVE METABOLIC PANEL (CC13)
ALT: 20 U/L (ref 0–55)
AST: 22 U/L (ref 5–34)
Creatinine: 0.8 mg/dL (ref 0.6–1.1)
Total Bilirubin: 1 mg/dL (ref 0.20–1.20)

## 2012-09-09 MED ORDER — IOHEXOL 300 MG/ML  SOLN
75.0000 mL | Freq: Once | INTRAMUSCULAR | Status: AC | PRN
  Administered 2012-09-09: 75 mL via INTRAVENOUS

## 2012-09-10 LAB — VITAMIN D 25 HYDROXY (VIT D DEFICIENCY, FRACTURES): Vit D, 25-Hydroxy: 52 ng/mL (ref 30–89)

## 2012-09-11 ENCOUNTER — Telehealth: Payer: Self-pay | Admitting: *Deleted

## 2012-09-11 NOTE — Telephone Encounter (Signed)
Called and spoke with pt; per Dr. Cyndie Chime informed pt that scan showed stable changes.  Pt verbalized understanding and confirmed appt for 09/16/12 at 3 pm.

## 2012-09-11 NOTE — Telephone Encounter (Signed)
Message copied by Gala Romney on Thu Sep 11, 2012 10:24 AM ------      Message from: Levert Feinstein      Created: Tue Sep 09, 2012  7:09 PM       Call pt scan stable changes

## 2012-09-16 ENCOUNTER — Encounter: Payer: Self-pay | Admitting: Oncology

## 2012-09-16 ENCOUNTER — Telehealth: Payer: Self-pay | Admitting: *Deleted

## 2012-09-16 ENCOUNTER — Other Ambulatory Visit: Payer: Self-pay | Admitting: Oncology

## 2012-09-16 ENCOUNTER — Telehealth: Payer: Self-pay | Admitting: Oncology

## 2012-09-16 ENCOUNTER — Ambulatory Visit (HOSPITAL_BASED_OUTPATIENT_CLINIC_OR_DEPARTMENT_OTHER): Payer: Medicare Other

## 2012-09-16 ENCOUNTER — Ambulatory Visit (HOSPITAL_BASED_OUTPATIENT_CLINIC_OR_DEPARTMENT_OTHER): Payer: Medicare Other | Admitting: Oncology

## 2012-09-16 DIAGNOSIS — C50919 Malignant neoplasm of unspecified site of unspecified female breast: Secondary | ICD-10-CM

## 2012-09-16 DIAGNOSIS — A319 Mycobacterial infection, unspecified: Secondary | ICD-10-CM

## 2012-09-16 DIAGNOSIS — Z853 Personal history of malignant neoplasm of breast: Secondary | ICD-10-CM

## 2012-09-16 DIAGNOSIS — R911 Solitary pulmonary nodule: Secondary | ICD-10-CM

## 2012-09-16 DIAGNOSIS — Z8543 Personal history of malignant neoplasm of ovary: Secondary | ICD-10-CM

## 2012-09-16 DIAGNOSIS — C50412 Malignant neoplasm of upper-outer quadrant of left female breast: Secondary | ICD-10-CM | POA: Insufficient documentation

## 2012-09-16 DIAGNOSIS — M810 Age-related osteoporosis without current pathological fracture: Secondary | ICD-10-CM

## 2012-09-16 DIAGNOSIS — A310 Pulmonary mycobacterial infection: Secondary | ICD-10-CM

## 2012-09-16 HISTORY — DX: Malignant neoplasm of unspecified site of unspecified female breast: C50.919

## 2012-09-16 MED ORDER — ZOLEDRONIC ACID 4 MG/5ML IV CONC
4.0000 mg | Freq: Once | INTRAVENOUS | Status: AC
  Administered 2012-09-16: 4 mg via INTRAVENOUS
  Filled 2012-09-16: qty 5

## 2012-09-16 MED ORDER — SODIUM CHLORIDE 0.9 % IV SOLN
Freq: Once | INTRAVENOUS | Status: AC
  Administered 2012-09-16: 17:00:00 via INTRAVENOUS

## 2012-09-16 NOTE — Progress Notes (Signed)
Hematology and Oncology Follow Up Visit  Crystal Bass [id number] 1941-10-28 71 y.o. 09/16/2012 4:33 PM   Principle Diagnosis: Encounter Diagnoses  Name Primary?  . Ductal carcinoma of breast, estrogen receptor positive, stage 1 Yes  . ATYPICAL MYCOBACTERIAL INFECTION      Interim History:   Followup visit for this 71 year old woman with a history of stage I ER positive cancer of the left breast diagnosed in January 2002. She was treated with a left modified radical mastectomy then initial tamoxifen hormonal therapy and then transitioned to an aromatase inhibitor which she took through 2012.[name] She had a single small pulmonary lesion that was positive on a PET scan at time of diagnosis.. It was surgically resected and found to be an area of Mycobacterium avium tuberculosis. In fact, a CT scan of the chest shows multiple bilateral areas of nodular pulmonary infiltrates which have fluctuated over time. She has been treated on at least 2 or 3 occasions for [name]. She has a chronic cough, and I would venture to guess that she has bronchiectasis. She is not immunocompromised as far as we can tell.  She continues to have an intermittent chronic cough and progressive dyspnea over time. I notice she is not really using any bronchodilators at this time. She is due to see a pulmonary physician tomorrow for followup.  She finally had to put her on 35 year old mother-in-law in a nursing home but she is still spending most of the day and evenings with her. Mrs. Secrest really has no other family support here in town. I told her that she needs to start taking care of herself.      Medications: reviewed  Allergies:  Allergies  Allergen Reactions  . Sulfonamide Derivatives     REACTION: anaphylaxis  . Hydrocodone Other (See Comments)    "hyper and climbing the walls"  . Tetracyclines & Related     "immediate yeast infection"    Review of Systems: Constitutional:   Chronic fatigue Respiratory:  See above Cardiovascular:  No chest pain Gastrointestinal: No change in bowel habit Genito-Urinary: No urinary tract symptoms Musculoskeletal: No bone pain Neurologic: No headache or change in vision Skin: No rash Remaining ROS negative.  Physical Exam: There were no vitals taken for this visit. Wt Readings from Last 3 Encounters:  08/15/12 142 lb (64.411 kg)  03/14/12 141 lb 11.2 oz (64.275 kg)  07/25/11 144 lb 6.4 oz (65.499 kg)     General appearance: Thin Caucasian woman HENNT: Pharynx no erythema or exudate Lymph nodes: No adenopathy Breasts: Left mastectomy, no chest wall lesions, no right breast masses Lungs: Clear to auscultation resonant to percussion with some fine dry rales at the bases right greater than left Heart: Regular rhythm no murmur Abdomen: Soft, nontender, no mass, no organomegaly Extremities: No edema, no calf tenderness Vascular: No cyanosis Neurologic: Motor strength 5 over 5, reflexes 1+ symmetric Skin: No rash or ecchymosis  CT scan of the chest which I personally reviewed and compared with the study done last year shows no significant changes in bilateral areas of pulmonary scarring which in some areas appear nodular but are unchanged.  Lab Results: Lab Results  Component Value Date   WBC 7.2 09/09/2012   HGB 13.1 09/09/2012   HCT 38.8 09/09/2012   MCV 95.1 09/09/2012   PLT 220 09/09/2012     Chemistry      Component Value Date/Time   NA 140 09/09/2012 1437   NA 140 03/11/2012 1346   K 4.0  09/09/2012 1437   K 4.2 03/11/2012 1346   CL 104 09/09/2012 1437   CL 106 03/11/2012 1346   CO2 25 09/09/2012 1437   CO2 28 03/11/2012 1346   BUN 18.0 09/09/2012 1437   BUN 24* 03/11/2012 1346   CREATININE 0.8 09/09/2012 1437   CREATININE 0.80 03/11/2012 1346      Component Value Date/Time   CALCIUM 10.0 09/09/2012 1437   CALCIUM 9.5 03/11/2012 1346   ALKPHOS 76 09/09/2012 1437   ALKPHOS 70 03/11/2012 1346   AST 22 09/09/2012 1437   AST 18 03/11/2012  1346   ALT 20 09/09/2012 1437   ALT 13 03/11/2012 1346   BILITOT 1.00 09/09/2012 1437   BILITOT 0.7 03/11/2012 1346       Radiological Studies: Ct Chest W Contrast  09/09/2012  *RADIOLOGY REPORT*  Clinical Data: Cough, fever, shortness of breath.  History of right lung cancer.  Follow-up [name] infection.  CT CHEST WITH CONTRAST  Technique:  Multidetector CT imaging of the chest was performed following the standard protocol during bolus administration of intravenous contrast.  Contrast: 75mL OMNIPAQUE IOHEXOL 300 MG/ML  SOLN  Comparison: 09/24/2011  Findings: Postoperative changes when two in the right upper lung. Previously seen peribronchovascular nodularity within the lungs has improved. Scattered residual peribronchovascular nodules in the posterior right upper lobe.  Areas of bronchiectasis and scarring in the lingula and anterior right middle lobe.  Spiculated nodular area in the right lower lobe measures 9 mm on today's study.  When measured at the same planes and level on previous study, this is stable.  No new or enlarging nodules.  No effusions.  Heart is normal size. Aorta is normal caliber. No mediastinal, hilar, or axillary adenopathy.  Visualized thyroid and chest wall soft tissues unremarkable. Changes of left mastectomy and lymph node dissection, stable. Imaging into the upper abdomen shows no acute findings.  IMPRESSION: Stable postsurgical changes in the right upper lung.  Significant improvement in peribronchovascular nodularity. Scattered nodules persist in the posterior right lung.  Spiculated nodular density the right lower lobe is unchanged.   Original Report Authenticated By: Cyndie Chime, M.D.     Impression and Plan:  #1. Stage I ER positive breast cancer treated as outlined above. She remains free of any obvious recurrence now out over 11 years from diagnosis.   #2. 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 Chronic  nodular pulmonary infiltrates. I'm now getting chest x-rays every 6 months and CT scans once a year. No acute changes on CT scan done on October 15 as outlined above.  #3. Chronic bronchitis/bronchiectasis secondary to #2   #4. Postmastectomy pain syndrome left chest wall and axilla-chronic   #5. Migraine headaches  #6. Ovarian cancer of low malignant potential status post hysterectomy plus oophorectomy May 2004   #7. Essential hypertension    CC:. Dr. Chrissie Noa upper; Dr. Cliffton Asters; Dr. Beaulah Dinning   Levert Feinstein, MD 10/22/20134:33 PM

## 2012-09-16 NOTE — Telephone Encounter (Signed)
Per staff mesage and POF I have scheduled appts. JMW  

## 2012-09-16 NOTE — Telephone Encounter (Signed)
APPTS MADE AND PRINTED FOR PT PT AWARE THAT ZOMETA WILL BE ADDED,MW EMAIL

## 2012-09-17 ENCOUNTER — Encounter: Payer: Self-pay | Admitting: Internal Medicine

## 2012-09-17 ENCOUNTER — Ambulatory Visit (INDEPENDENT_AMBULATORY_CARE_PROVIDER_SITE_OTHER): Payer: Medicare Other | Admitting: Internal Medicine

## 2012-09-17 VITALS — BP 92/70 | HR 76 | Temp 97.8°F | Ht 63.0 in | Wt 141.6 lb

## 2012-09-17 DIAGNOSIS — R0600 Dyspnea, unspecified: Secondary | ICD-10-CM

## 2012-09-17 DIAGNOSIS — R0609 Other forms of dyspnea: Secondary | ICD-10-CM

## 2012-09-17 DIAGNOSIS — R059 Cough, unspecified: Secondary | ICD-10-CM

## 2012-09-17 DIAGNOSIS — R0989 Other specified symptoms and signs involving the circulatory and respiratory systems: Secondary | ICD-10-CM

## 2012-09-17 DIAGNOSIS — R05 Cough: Secondary | ICD-10-CM

## 2012-09-17 MED ORDER — BUDESONIDE 180 MCG/ACT IN AEPB: 2.0000 | INHALATION_SPRAY | Freq: Two times a day (BID) | RESPIRATORY_TRACT | Status: DC

## 2012-09-17 NOTE — Progress Notes (Signed)
Subjective:    Patient ID: Crystal Bass, female    DOB: Jan 01, 1941, 71 y.o.   MRN: [id number]  HPI HPI  55  yowf never smoker with h/o [name] referred to Pulmonary clinic by Dr Alwyn Ren for chronic cough.    CXR 4/13 COPD/emphysema  Stable postoperative changes involving the right lung.   CT chest 03/12/11 post op changes on R, scattered perbronch nodules and mild bronchiectasis    03/13/2011 Initial pulmonary office eval in EMR era main c/o chronic cough (x years) and sob and worse x 3 weeks abruptly sob  Assoc with cough with green mucus which is a lot better p rx with augmentin  but breathing not better to her satisfaction since starting topamax with variable doe and resting sob and  having trouble talking.  No overt hb. No better on advair.  > rec stop advair, start high dose ppi/ pepcid hs and diet  03/28/2011 ov / Wert some better cough  But still sob with housework and walk at [employer], does not use [facility] parking. rec Continue Prilosec 20 mg Take 30-60 min before first meal of the day and Pepcid 20 mg one at bedtime  GERD (REFLUX) diet  04/17/2011 ov/Wert  Cc doe x years walking x 30 min x moderate pace. Cough resolved "because I stopped the topamax" Sleeping ok without nocturnal  or early am exac of resp c/o's. >>d/c ppi   08/15/2012 Acute OV  Complains of prod cough with yellow mucus, increased SOB x2 weeks.  Was given augmentin by PCP yesterday. Has not started this yet .  Not seen for > 1 year , doing okay w/out flares   no otc used.  No fever or hemoptysis . No chest pain  No recent travel or abx uses     Take Augmentin as directed  Mucinex DM Twice daily As needed Cough/congestion  Fluids and rest  follow up 4 weeks and As needed  I will call with xray results.  Please contact office for sooner follow up if symptoms do not improve or worsen or seek emergency care   OV 09/17/2012  Dyspnea: insidious onset. Progressive/Stable since onset several years ago. Rates  dyspnea as moderate. Activities such as walking < few hundred feet or < 1/4 mile makes her dyspneic. Humidity, pollens makes dyspnea worse.  Improved by sitting and resting. No associated chest pains. She feels she is deconditioned and is in a catch 22 situation.    Cough: is independent of dyspnea. Cough present for several years. Constant (not on and off). Worsened by infectious bronchitis episodes. Never had remission beyond 4 weeks with cough. Cough is moderate in intensity at baseline and with infectious episodes can get severe. She finds this embarrassing esp because she has to visit her ailing m-i-l in a nursing home. Quality is wet; brings out pea-green sputum during bronchitis but currently jelly like yellow which is baseline. OF note, she took 2 x 6-9 months of [name] Rx in 2002 and 2003 but she is not sure if that helped cough.    - Sinus: denies problems other than anosmia. Denies chronic rhino sinusitis symptoms Never seen ENT  - Allergies - sees Dr [place] Callas and has weekly allergy shots against mildew, southern grasses x 40 years. Symptoms are well controled but are typically sneezing, runny nose, eyes. Says if she does not take allergy shots - allergies return with a "vengeance"   - GERD: denies. Per hx prior trial of ppi did not help  -  BP: takes losartan and lopressor  - Pulmonary - never smoker. Born with asthma. Thinks she has copd. Does not take any mdi because she felt she could manage without it. However, Dr Terrell Callas has recommended mdi and she believes she is close to making a decision on this.  PFTs may 2012: Mild obstruction with non-significant BD response. Normal DLCO. Carries prior diagnosis of bronchiectasis and mom died of it. .Denies diagnostic workup for bronchiectasis and does not want workup. CT oct 2013: Areas of bronchiectasis and scarring in the lingula and anterior right middle lobe.    - Pulmonary nodule: RLL Oct 2013: stable since oct 2012  Review of Systems    Constitutional: Negative for fever and unexpected weight change.  HENT: Negative for ear pain, nosebleeds, congestion, sore throat, rhinorrhea, sneezing, trouble swallowing, dental problem, postnasal drip and sinus pressure.   Eyes: Negative for redness and itching.  Respiratory: Positive for cough and shortness of breath. Negative for chest tightness and wheezing.   Cardiovascular: Negative for palpitations and leg swelling.  Gastrointestinal: Negative for nausea and vomiting.  Genitourinary: Negative for dysuria.  Musculoskeletal: Negative for joint swelling.  Skin: Negative for rash.  Neurological: Positive for headaches.  Hematological: Does not bruise/bleed easily.  Psychiatric/Behavioral: Negative for dysphoric mood. The patient is not nervous/anxious.       Current outpatient prescriptions:aspirin 81 MG tablet, Take 81 mg by mouth daily.  , Disp: , Rfl: ;  Calcium Carbonate-Vit D-Min (CALCIUM 600 + MINERALS) 600-200 MG-UNIT TABS, Take 1 tablet by mouth 2 (two) times daily., Disp: , Rfl: ;  fexofenadine (ALLEGRA) 180 MG tablet, Take 180 mg by mouth daily as needed. , Disp: , Rfl: ;  gabapentin ([name]) 300 MG capsule, TAKE 1 TO 3 CAPSULES BY MOUTH EVERY 8 HOURS AS NEEDED, Disp: 180 capsule, Rfl: 2 IMITREX 100 MG tablet, Take 100 mg by mouth every 2 (two) hours as needed. , Disp: , Rfl: ;  losartan (COZAAR) 50 MG tablet, TAKE 1 TABLET EVERY DAY, Disp: 90 tablet, Rfl: 0;  metoprolol succinate (TOPROL-XL) 25 MG 24 hr tablet, Take 25 mg by mouth 2 (two) times daily.  , Disp: , Rfl: ;  Multiple Vitamin (MULTIVITAMIN) capsule, Take 1 capsule by mouth daily., Disp: , Rfl:  Vitamin D, Ergocalciferol, ([name]) 50000 UNITS CAPS, Take 1 capsule by mouth once a week., Disp: , Rfl: ;  zolendronic acid (ZOMETA) 4 MG/5ML injection, Inject 4 mg into the vein every 6 (six) months.  , Disp: , Rfl:  No current facility-administered medications for this visit. Facility-Administered Medications Ordered in  Other Visits: 0.9 %  sodium chloride infusion, , Intravenous, Once, Levert Feinstein, MD;  zolendronic acid (ZOMETA) 4 mg in sodium chloride 0.9 % 100 mL IVPB, 4 mg, Intravenous, Once, Levert Feinstein, MD, 4 mg at 09/16/12 1639  Objective:   Physical Exam amb hoarse wf  Wt  146 03/13/2011  > 144 03/28/2011 > 142 04/17/2011 >08/15/2012 142   GEN: A/Ox3; pleasant , NAD, well nourished   HEENT:  /AT,  EACs-clear, TMs-wnl, NOSE-clear, THROAT-clear, no lesions, no postnasal drip or exudate noted.   NECK:  Supple w/ fair ROM; no JVD; normal carotid impulses w/o bruits; no thyromegaly or nodules palpated; no lymphadenopathy.  RESP  Coarse BS w/ few rhonchi no accessory muscle use, no dullness to percussion  CARD:  RRR, no m/r/g  , no peripheral edema, pulses intact, no cyanosis or clubbing.  GI:   Soft & nt; nml bowel sounds; no  organomegaly or masses detected.  Musco: Warm bil, no deformities or joint swelling noted.   Neuro: alert, no focal deficits noted.    Skin: Warm, no lesions or rashes       Assessment & Plan:

## 2012-09-17 NOTE — Patient Instructions (Addendum)
CMA/RN will walk you for oxygen levels right now Let us try pulmicort , 2puff twice daily - take few samples and learn technique. Will send in script as well REturn in 4-6 weeks with pft testing Cough score at followup

## 2012-09-23 ENCOUNTER — Other Ambulatory Visit: Payer: Self-pay | Admitting: Internal Medicine

## 2012-09-23 NOTE — Telephone Encounter (Signed)
Rx sent to pharmacy.     MW

## 2012-09-23 NOTE — Telephone Encounter (Signed)
180 supply only last 20 days if taken as directed by instructions. Patient has filled the 180 plus 2 additional refills given on 06/23/12.   Dr.Hopper please advise on refill request (Dispense Number and allowed number of Refills)

## 2012-09-23 NOTE — Telephone Encounter (Signed)
Left message on voicemail for pharmacy to call and confirm if patient already picked up the 2 additional refills that were given on 06/23/12

## 2012-09-23 NOTE — Telephone Encounter (Signed)
OK X1, #270 ;but additional refills after that require office visit to update medical history.

## 2012-09-27 NOTE — Assessment & Plan Note (Signed)
CMA/RN will walk you for oxygen levels right now Let us try pulmicort , 2puff twice daily - take few samples and learn technique. Will send in script as well REturn in 4-6 weeks with pft testing

## 2012-09-29 ENCOUNTER — Telehealth: Payer: Self-pay | Admitting: Internal Medicine

## 2012-09-29 MED ORDER — PREDNISONE 10 MG PO TABS: ORAL_TABLET | ORAL | Status: DC

## 2012-09-29 MED ORDER — LEVOFLOXACIN 500 MG PO TABS: 500.0000 mg | ORAL_TABLET | Freq: Every day | ORAL | Status: DC

## 2012-09-29 MED ORDER — ALBUTEROL SULFATE HFA 108 (90 BASE) MCG/ACT IN AERS: 2.0000 | INHALATION_SPRAY | Freq: Four times a day (QID) | RESPIRATORY_TRACT | Status: DC | PRN

## 2012-09-29 NOTE — Telephone Encounter (Signed)
I spoke with pt and she c/o cough w/ green phlem, increase SOB, wheezing, chest tx, and "skin feels like it hurts" x couple days. No f/c/s/n/v. She refused OV stating she did not want to see another provider. Pt would like something called in. Please advise Dr. Marchelle Gearing, thanks  Allergies  Allergen Reactions  . Sulfonamide Derivatives     REACTION: anaphylaxis  . Hydrocodone Other (See Comments)    "hyper and climbing the walls"  . Tetracyclines & Related     "immediate yeast infection"

## 2012-09-29 NOTE — Telephone Encounter (Signed)
I spoke with pt and is aware of MR recs. She voiced her understanding and needed nothing further. rx's have been sent to the pharmacy

## 2012-09-29 NOTE — Telephone Encounter (Signed)
take levaquin 500mg once daily  X 5 days   Take prednisone 40 mg daily x 2 days, then 20mg daily x 2 days, then 10mg daily x 2 days, then 5mg daily x 2 days and stop  

## 2012-10-01 ENCOUNTER — Ambulatory Visit (INDEPENDENT_AMBULATORY_CARE_PROVIDER_SITE_OTHER)
Admission: RE | Admit: 2012-10-01 | Discharge: 2012-10-01 | Disposition: A | Discharge status: Home / Self Care | Payer: Medicare Other | Source: Ambulatory Visit | Attending: Adult Health | Admitting: Adult Health

## 2012-10-01 ENCOUNTER — Encounter: Payer: Self-pay | Admitting: Adult Health

## 2012-10-01 ENCOUNTER — Ambulatory Visit (INDEPENDENT_AMBULATORY_CARE_PROVIDER_SITE_OTHER): Payer: Medicare Other | Admitting: Adult Health

## 2012-10-01 VITALS — BP 136/80 | HR 84 | Temp 97.3°F | Ht 63.0 in | Wt 143.2 lb

## 2012-10-01 DIAGNOSIS — J449 Chronic obstructive pulmonary disease, unspecified: Secondary | ICD-10-CM

## 2012-10-01 DIAGNOSIS — J4489 Other specified chronic obstructive pulmonary disease: Secondary | ICD-10-CM

## 2012-10-01 MED ORDER — BENZONATATE 200 MG PO CAPS: 200.0000 mg | ORAL_CAPSULE | Freq: Three times a day (TID) | ORAL | Status: DC | PRN

## 2012-10-01 NOTE — Progress Notes (Signed)
Subjective:    Patient ID: Crystal Bass, female    DOB: 04/17/1941, 71 y.o.   MRN: [id number]  HPI 77  yowf never smoker with h/o [name] referred to Pulmonary clinic by Dr Alwyn Ren for chronic cough. CXR 4/13 COPD/emphysema  Stable postoperative changes involving the right lung. CT chest 03/12/11 post op changes on R, scattered perbronch nodules and mild bronchiectasis  03/13/2011 Initial pulmonary office eval in EMR era main c/o chronic cough (x years) and sob and worse x 3 weeks abruptly sob  Assoc with cough with green mucus which is a lot better p rx with augmentin  but breathing not better to her satisfaction since starting topamax with variable doe and resting sob and  having trouble talking.  No overt hb. No better on advair.  > rec stop advair, start high dose ppi/ pepcid hs and diet  03/28/2011 ov / Wert some better cough  But still sob with housework and walk at [employer], does not use [facility] parking. rec Continue Prilosec 20 mg Take 30-60 min before first meal of the day and Pepcid 20 mg one at bedtime  GERD (REFLUX) diet  04/17/2011 ov/Wert  Cc doe x years walking x 30 min x moderate pace. Cough resolved "because I stopped the topamax" Sleeping ok without nocturnal  or early am exac of resp c/o's. >>d/c ppi   08/15/2012 Acute OV  Complains of prod cough with yellow mucus, increased SOB x2 weeks.  Was given augmentin by PCP yesterday. Has not started this yet .  Not seen for > 1 year , doing okay w/out flares   no otc used.  No fever or hemoptysis . No chest pain  No recent travel or abx uses  >Take Augmentin as directed  Mucinex DM Twice daily As needed Cough/congestion  Fluids and rest  follow up 4 weeks and As needed  I will call with xray results.  Please contact office for sooner follow up if symptoms do not improve or worsen or seek emergency care   OV 09/17/2012 Dyspnea: insidious onset. Progressive/Stable since onset several years ago. Rates dyspnea as moderate.  Activities such as walking < few hundred feet or < 1/4 mile makes her dyspneic. Humidity, pollens makes dyspnea worse.  Improved by sitting and resting. No associated chest pains. She feels she is deconditioned and is in a catch 22 situation.    Cough: is independent of dyspnea. Cough present for several years. Constant (not on and off). Worsened by infectious bronchitis episodes. Never had remission beyond 4 weeks with cough. Cough is moderate in intensity at baseline and with infectious episodes can get severe. She finds this embarrassing esp because she has to visit her ailing m-i-l in a nursing home. Quality is wet; brings out pea-green sputum during bronchitis but currently jelly like yellow which is baseline. OF note, she took 2 x 6-9 months of [name] Rx in 2002 and 2003 but she is not sure if that helped cough.    - Sinus: denies problems other than anosmia. Denies chronic rhino sinusitis symptoms Never seen ENT  - Allergies - sees Dr [place] Callas and has weekly allergy shots against mildew, southern grasses x 40 years. Symptoms are well controled but are typically sneezing, runny nose, eyes. Says if she does not take allergy shots - allergies return with a "vengeance"   - GERD: denies. Per hx prior trial of ppi did not help  - BP: takes losartan and lopressor  - Pulmonary - never smoker.  Born with asthma. Thinks she has copd. Does not take any mdi because she felt she could manage without it. However, Dr [place] Callas has recommended mdi and she believes she is close to making a decision on this.  PFTs may 2012: Mild obstruction with non-significant BD response. Normal DLCO. Carries prior diagnosis of bronchiectasis and mom died of it. .Denies diagnostic workup for bronchiectasis and does not want workup. CT oct 2013: Areas of bronchiectasis and scarring in the lingula and anterior right middle lobe.    - Pulmonary nodule: RLL Oct 2013: stable since oct 2012  10/01/2012 Acute OV  Complains of increased SOB,  wheezing, prod cough with cream/gray/pea-green mucus, fever up to 100 x6 days - reports was cleaning mildew outside the day before.  was given levaquin and pred taper by MR 11/4 via phone msg No hemoptyiss , chest pain or edema.  Mistakenly took dog's allergy med this am. Poison control and PCP/Vet said she should be fine.  No otc used .      Review of Systems  Constitutional:   No  weight loss, night sweats,  + Fevers, chills, fatigue, or  lassitude.  HEENT:   No headaches,  Difficulty swallowing,  Tooth/dental problems, or  Sore throat,                No sneezing, itching, ear ache,  _+nasal congestion, post nasal drip,   CV:  No chest pain,  Orthopnea, PND, swelling in lower extremities, anasarca, dizziness, palpitations, syncope.   GI  No heartburn, indigestion, abdominal pain, nausea, vomiting, diarrhea, change in bowel habits, loss of appetite, bloody stools.   Resp:  No coughing up of blood.    No chest wall deformity  Skin: no rash or lesions.  GU: no dysuria, change in color of urine, no urgency or frequency.  No flank pain, no hematuria   MS:  No joint pain or swelling.  No decreased range of motion.  No back pain.  Psych:  No change in mood or affect. No depression or anxiety.  No memory loss.        Objective:   Physical Exam amb hoarse wf  Wt  146 03/13/2011  > 144 03/28/2011 > 142 04/17/2011 >08/15/2012 142 >143 10/01/2012   GEN: A/Ox3; pleasant , NAD, well nourished   HEENT:  [place]/AT,  EACs-clear, TMs-wnl, NOSE-clear, THROAT-clear, no lesions, no postnasal drip or exudate noted.   NECK:  Supple w/ fair ROM; no JVD; normal carotid impulses w/o bruits; no thyromegaly or nodules palpated; no lymphadenopathy.  RESP  Coarse BS w/ few rhonchi no accessory muscle use, no dullness to percussion  CARD:  RRR, no m/r/g  , no peripheral edema, pulses intact, no cyanosis or clubbing.  GI:   Soft & nt; nml bowel sounds; no organomegaly or masses detected.  Musco: Warm  bil, no deformities or joint swelling noted.   Neuro: alert, no focal deficits noted.    Skin: Warm, no lesions or rashes       Assessment & Plan:

## 2012-10-01 NOTE — Assessment & Plan Note (Signed)
Flare  Check xray  xopenex neb in office   Plan Finish Levaquin and Prednisone as directed  Mucinex DM Twice daily  As needed  Cough/congestion  Tessalon Three times a day  As needed  Cough  Fluids and rest  follow up 4 weeks and As needed   I will call with xray results.  Please contact office for sooner follow up if symptoms do not improve or worsen or seek emergency care

## 2012-10-01 NOTE — Addendum Note (Signed)
Addended by: Boone Master E on: 10/01/2012 12:14 PM   Modules accepted: Orders

## 2012-10-01 NOTE — Patient Instructions (Addendum)
Finish Levaquin and Prednisone as directed  Mucinex DM Twice daily  As needed  Cough/congestion  Tessalon Three times a day  As needed  Cough  Fluids and rest  follow up 4 weeks and As needed   I will call with xray results.  Please contact office for sooner follow up if symptoms do not improve or worsen or seek emergency care

## 2012-10-08 NOTE — Progress Notes (Signed)
Quick Note:  Pt aware of results and will follow up with MR(not a PW patient). Levaquin has been finished. ______

## 2012-10-08 NOTE — Progress Notes (Signed)
Quick Note:  LMOM TCB x1. ______ 

## 2012-10-21 ENCOUNTER — Ambulatory Visit (INDEPENDENT_AMBULATORY_CARE_PROVIDER_SITE_OTHER): Payer: Medicare Other | Admitting: Internal Medicine

## 2012-10-21 ENCOUNTER — Encounter: Payer: Self-pay | Admitting: Internal Medicine

## 2012-10-21 VITALS — BP 102/72 | HR 66 | Temp 97.8°F | Ht 63.0 in | Wt 139.0 lb

## 2012-10-21 DIAGNOSIS — R059 Cough, unspecified: Secondary | ICD-10-CM

## 2012-10-21 DIAGNOSIS — R0600 Dyspnea, unspecified: Secondary | ICD-10-CM

## 2012-10-21 DIAGNOSIS — J449 Chronic obstructive pulmonary disease, unspecified: Secondary | ICD-10-CM

## 2012-10-21 DIAGNOSIS — Z23 Encounter for immunization: Secondary | ICD-10-CM

## 2012-10-21 DIAGNOSIS — R05 Cough: Secondary | ICD-10-CM

## 2012-10-21 LAB — PULMONARY FUNCTION TEST

## 2012-10-21 NOTE — Progress Notes (Signed)
PFT done today. 

## 2012-10-21 NOTE — Progress Notes (Signed)
Subjective:    Patient ID: Crystal Bass, female    DOB: 1941/07/20, 71 y.o.   MRN: [id number]  HPI 3  yowf never smoker with h/o [name] referred to Pulmonary clinic by Dr Alwyn Ren for chronic cough. CXR 4/13 COPD/emphysema  Stable postoperative changes involving the right lung. CT chest 03/12/11 post op changes on R, scattered perbronch nodules and mild bronchiectasis  03/13/2011 Initial pulmonary office eval in EMR era main c/o chronic cough (x years) and sob and worse x 3 weeks abruptly sob  Assoc with cough with green mucus which is a lot better p rx with augmentin  but breathing not better to her satisfaction since starting topamax with variable doe and resting sob and  having trouble talking.  No overt hb. No better on advair.  > rec stop advair, start high dose ppi/ pepcid hs and diet  03/28/2011 ov / Wert some better cough  But still sob with housework and walk at [employer], does not use [facility] parking. rec Continue Prilosec 20 mg Take 30-60 min before first meal of the day and Pepcid 20 mg one at bedtime  GERD (REFLUX) diet  04/17/2011 ov/Wert  Cc doe x years walking x 30 min x moderate pace. Cough resolved "because I stopped the topamax" Sleeping ok without nocturnal  or early am exac of resp c/o's. >>d/c ppi   08/15/2012 Acute OV  Complains of prod cough with yellow mucus, increased SOB x2 weeks.  Was given augmentin by PCP yesterday. Has not started this yet .  Not seen for > 1 year , doing okay w/out flares   no otc used.  No fever or hemoptysis . No chest pain  No recent travel or abx uses  >Take Augmentin as directed  Mucinex DM Twice daily As needed Cough/congestion  Fluids and rest  follow up 4 weeks and As needed  I will call with xray results.  Please contact office for sooner follow up if symptoms do not improve or worsen or seek emergency care   OV 09/17/2012  - switched to Dr MR Dyspnea: insidious onset. Progressive/Stable since onset several years ago. Rates dyspnea  as moderate. Activities such as walking < few hundred feet or < 1/4 mile makes her dyspneic. Humidity, pollens makes dyspnea worse.  Improved by sitting and resting. No associated chest pains. She feels she is deconditioned and is in a catch 22 situation.    Cough: is independent of dyspnea. Cough present for several years. Constant (not on and off). Worsened by infectious bronchitis episodes. Never had remission beyond 4 weeks with cough. Cough is moderate in intensity at baseline and with infectious episodes can get severe. She finds this embarrassing esp because she has to visit her ailing m-i-l in a nursing home. Quality is wet; brings out pea-green sputum during bronchitis but currently jelly like yellow which is baseline. OF note, she took 2 x 6-9 months of [name] Rx in 2002 and 2003 but she is not sure if that helped cough.    - Sinus: denies problems other than anosmia. Denies chronic rhino sinusitis symptoms Never seen ENT  - Allergies - sees Dr [place] Callas and has weekly allergy shots against mildew, southern grasses x 40 years. Symptoms are well controled but are typically sneezing, runny nose, eyes. Says if she does not take allergy shots - allergies return with a "vengeance"   - GERD: denies. Per hx prior trial of ppi did not help  - BP: takes losartan and lopressor  -  Pulmonary - never smoker. Born with asthma. Thinks she has copd now. Does not take any mdi because she felt she could manage without it. However, Dr [place] Callas has recommended mdi and she believes she is close to making a decision on this.  PFTs may 2012: Mild obstruction with non-significant BD response. Normal DLCO. Carries prior diagnosis of bronchiectasis and mom died of it. .Denies diagnostic workup for bronchiectasis and does not want workup. CT oct 2013: Areas of bronchiectasis and scarring in the lingula and anterior right middle lobe. Pulmonary nodule: RLL Oct 2013: stable since oct 2012  10/01/2012 Acute OV  Complains of  increased SOB, wheezing, prod cough with cream/gray/pea-green mucus, fever up to 100 x6 days - reports was cleaning mildew outside the day before.  was given levaquin and pred taper by MR 11/4 via phone msg No hemoptyiss , chest pain or edema.  Mistakenly took dog's allergy med this am. Poison control and PCP/Vet said she should be fine.  No otc used .    REC CMA/RN will walk you for oxygen levels right now  Let us try pulmicort , 2puff twice daily - take few samples and learn technique. Will send in script as well  REturn in 4-6 weeks with pft testing  Cough score at followup  OV 10/21/2012   Fu dyspnea and cough after starting pulmicort. She feels both have improved around 75%. "Yucky" yellow sputum is resolved. She is no longer embarrassed about visiting her mother in law in SNF. Current RSI cough score is 9 and reflects improvement post pulmicort; we do not have baseline value. Kouffman cough differentiator score is 0 for GERD and 1 for airway/neurogenic. PFT 10/21/2012 below: shows miild-moderate obstriuction with sig BD responmse and normal DLCO (like an asthmatic). She is hesitant to continue ICS due to risk of accelerated bone loss but after long discussion agreed. She agrees to add LABA Rx and therefore will  switch from pulmicort to symbicort   Dr Gretta Cool Reflux Symptom Index (> 13-15 suggestive of LPR cough) 10/21/2012 Post pulmicort  Hoarseness of problem with voice 3  Clearing  Of Throat 2  Excess throat mucus or feeling of post nasal drip 0  Difficulty swallowing food, liquid or tablets 0  Cough after eating or lying down 0  Breathing difficulties or choking episodes 4  Troublesome or annoying cough 3  Sensation of something sticking in throat or lump in throat 0  Heartburn, chest pain, indigestion, or stomach acid coming up 0  TOTAL 12     PFTs 10/21/2012   PFT FVC fev1 ratio BD fev1 TLC DLCO comments  10/21/2012  post pulmicort 2.L/78% 1.35L/69% 64/down%  12% improvement in FEv1 to 1.5L 3.9L/85% 14.8/82% Mild Obstruction with posiitive BD response; Normal DLco - c/w asthmatic response      Review of Systems  Constitutional: Negative for fever and unexpected weight change.  HENT: Negative for ear pain, nosebleeds, congestion, sore throat, rhinorrhea, sneezing, trouble swallowing, dental problem, postnasal drip and sinus pressure.   Eyes: Negative for redness and itching.  Respiratory: Positive for shortness of breath. Negative for cough, chest tightness and wheezing.   Cardiovascular: Negative for palpitations and leg swelling.  Gastrointestinal: Negative for nausea and vomiting.  Genitourinary: Negative for dysuria.  Musculoskeletal: Negative for joint swelling.  Skin: Negative for rash.  Neurological: Negative for headaches.  Hematological: Does not bruise/bleed easily.  Psychiatric/Behavioral: Negative for dysphoric mood. The patient is not nervous/anxious.    Current outpatient prescriptions:acetaminophen (TYLENOL) 325 MG  tablet, Take 650 mg by mouth every 6 (six) hours as needed., Disp: , Rfl: ;  albuterol (PROAIR HFA) 108 (90 BASE) MCG/ACT inhaler, Inhale 2 puffs into the lungs every 6 (six) hours as needed., Disp: 1 Inhaler, Rfl: 3;  aspirin 81 MG tablet, Take 81 mg by mouth daily.  , Disp: , Rfl:  budesonide (PULMICORT FLEXHALER) 180 MCG/ACT inhaler, Inhale 2 puffs into the lungs 2 (two) times daily., Disp: 1 Inhaler, Rfl: 3;  Calcium Carbonate-Vit D-Min (CALCIUM 600 + MINERALS) 600-200 MG-UNIT TABS, Take 1 tablet by mouth 2 (two) times daily., Disp: , Rfl: ;  fexofenadine (ALLEGRA) 180 MG tablet, Take 180 mg by mouth daily as needed. , Disp: , Rfl:  gabapentin ([name]) 300 MG capsule, TAKE 1 TO 3 CAPSULES BY MOUTH EVERY 8 HOURS AS NEEDED, Disp: 270 capsule, Rfl: 0;  IMITREX 100 MG tablet, Take 100 mg by mouth every 2 (two) hours as needed. , Disp: , Rfl: ;  losartan (COZAAR) 50 MG tablet, TAKE 1 TABLET EVERY DAY, Disp: 90 tablet, Rfl:  0;  metoprolol succinate (TOPROL-XL) 25 MG 24 hr tablet, Take 25 mg by mouth 2 (two) times daily.  , Disp: , Rfl:  Multiple Vitamin (MULTIVITAMIN) capsule, Take 1 capsule by mouth daily., Disp: , Rfl: ;  Vitamin D, Ergocalciferol, ([name]) 50000 UNITS CAPS, Take 1 capsule by mouth once a week., Disp: , Rfl: ;  zolendronic acid (ZOMETA) 4 MG/5ML injection, Inject 4 mg into the vein every 6 (six) months.  , Disp: , Rfl:  benzonatate (TESSALON) 200 MG capsule, Take 1 capsule (200 mg total) by mouth 3 (three) times daily as needed for cough., Disp: 30 capsule, Rfl: 1      Objective:   Physical Exam amb hoarse wf    GEN: A/Ox3; pleasant , NAD, well nourished   HEENT:  [place]/AT,  EACs-clear, TMs-wnl, NOSE-clear, THROAT-clear, no lesions, no postnasal drip or exudate noted.   NECK:  Supple w/ fair ROM; no JVD; normal carotid impulses w/o bruits; no thyromegaly or nodules palpated; no lymphadenopathy.  RESP  Coarse BS w/ few rhonchi no accessory muscle use, no dullness to percussion  CARD:  RRR, no m/r/g  , no peripheral edema, pulses intact, no cyanosis or clubbing.  GI:   Soft & nt; nml bowel sounds; no organomegaly or masses detected.  Musco: Warm bil, no deformities or joint swelling noted.   Neuro: alert, no focal deficits noted.    Skin: Warm, no lesions or rashes          Assessment & Plan:

## 2012-10-21 NOTE — Patient Instructions (Addendum)
You have asthma or your bronchiectasis making you behave like an asthmatic PFT still shows obstruction at moderate severity despite pulmicort So stop pulmicort Please start symbicort 80/4.5 2 puff twice daily - take sample, script and show technique Use albuterol as needed Have pneumovax 10/21/2012 Glad you had flu shot Return in 3 months or sooner if needed   

## 2012-10-25 NOTE — Assessment & Plan Note (Signed)
You have asthma or your bronchiectasis making you behave like an asthmatic PFT still shows obstruction at moderate severity despite pulmicort So stop pulmicort Please start symbicort 80/4.5 2 puff twice daily - take sample, script and show technique Use albuterol as needed Have pneumovax 10/21/2012 Glad you had flu shot Return in 3 months or sooner if needed

## 2012-11-11 ENCOUNTER — Telehealth: Payer: Self-pay | Admitting: Internal Medicine

## 2012-11-11 MED ORDER — BUDESONIDE-FORMOTEROL FUMARATE 160-4.5 MCG/ACT IN AERO: 2.0000 | INHALATION_SPRAY | Freq: Two times a day (BID) | RESPIRATORY_TRACT | Status: DC

## 2012-11-11 NOTE — Telephone Encounter (Signed)
lmomtcb x1 

## 2012-11-12 ENCOUNTER — Other Ambulatory Visit: Payer: Self-pay | Admitting: Internal Medicine

## 2012-11-12 NOTE — Telephone Encounter (Signed)
Left message on voicemail informing patient to call the office, reason for call patient needs to sign controlled substance contract in order to pick-up rx

## 2012-11-12 NOTE — Telephone Encounter (Signed)
Pt aware RX sent and picked up last night.

## 2012-11-13 NOTE — Telephone Encounter (Signed)
RX and contract placed at front for pick-up

## 2012-11-13 NOTE — Telephone Encounter (Signed)
Discuss with patient, will come in.

## 2012-11-26 HISTORY — PX: COLONOSCOPY: SHX174

## 2012-12-25 ENCOUNTER — Encounter: Payer: Self-pay | Admitting: Internal Medicine

## 2012-12-26 ENCOUNTER — Other Ambulatory Visit: Payer: Self-pay | Admitting: Internal Medicine

## 2012-12-26 NOTE — Telephone Encounter (Signed)
Hopp please advise on refill for Gabapentin, verify instructions

## 2012-12-26 NOTE — Telephone Encounter (Signed)
Refill X 3 mos 

## 2012-12-30 ENCOUNTER — Encounter: Payer: Self-pay | Admitting: Internal Medicine

## 2013-02-17 ENCOUNTER — Ambulatory Visit (AMBULATORY_SURGERY_CENTER): Payer: Medicare Other

## 2013-02-17 VITALS — Ht 63.0 in | Wt 145.4 lb

## 2013-02-17 DIAGNOSIS — Z8371 Family history of colonic polyps: Secondary | ICD-10-CM

## 2013-02-17 DIAGNOSIS — Z800 Family history of malignant neoplasm of digestive organs: Secondary | ICD-10-CM

## 2013-02-17 DIAGNOSIS — Z1211 Encounter for screening for malignant neoplasm of colon: Secondary | ICD-10-CM

## 2013-02-17 DIAGNOSIS — Z853 Personal history of malignant neoplasm of breast: Secondary | ICD-10-CM

## 2013-02-17 MED ORDER — MOVIPREP 100 G PO SOLR: ORAL | Status: DC

## 2013-02-19 ENCOUNTER — Encounter: Payer: Self-pay | Admitting: Internal Medicine

## 2013-02-20 ENCOUNTER — Ambulatory Visit
Admission: RE | Admit: 2013-02-20 | Discharge: 2013-02-20 | Disposition: A | Discharge status: Home / Self Care | Payer: Medicare Other | Source: Ambulatory Visit | Attending: Oncology | Admitting: Oncology

## 2013-02-20 DIAGNOSIS — C50919 Malignant neoplasm of unspecified site of unspecified female breast: Secondary | ICD-10-CM

## 2013-02-23 ENCOUNTER — Telehealth: Payer: Self-pay | Admitting: Internal Medicine

## 2013-02-23 NOTE — Telephone Encounter (Signed)
Spoke with pt and informed her we would mail her a voucher for a free Moviprep. We will call in a new Rx for Moviprep into [facility] on [address].Pt will call our office if she has any other questions or problems.

## 2013-02-27 ENCOUNTER — Telehealth: Payer: Self-pay | Admitting: Oncology

## 2013-02-27 NOTE — Telephone Encounter (Signed)
pt called bc she was missing lab prior to tx...added lab...pt also wanted a xray however no orders in system...transferred pt to desk nurse

## 2013-03-01 ENCOUNTER — Other Ambulatory Visit: Payer: Self-pay | Admitting: Oncology

## 2013-03-01 DIAGNOSIS — R918 Other nonspecific abnormal finding of lung field: Secondary | ICD-10-CM

## 2013-03-01 DIAGNOSIS — A310 Pulmonary mycobacterial infection: Secondary | ICD-10-CM

## 2013-03-03 ENCOUNTER — Telehealth: Payer: Self-pay | Admitting: Oncology

## 2013-03-03 ENCOUNTER — Ambulatory Visit (AMBULATORY_SURGERY_CENTER): Payer: Medicare Other | Admitting: Internal Medicine

## 2013-03-03 ENCOUNTER — Encounter: Payer: Self-pay | Admitting: Internal Medicine

## 2013-03-03 VITALS — BP 151/81 | HR 51 | Temp 97.3°F | Resp 15 | Ht 63.0 in | Wt 145.0 lb

## 2013-03-03 DIAGNOSIS — Z1211 Encounter for screening for malignant neoplasm of colon: Secondary | ICD-10-CM

## 2013-03-03 DIAGNOSIS — Z800 Family history of malignant neoplasm of digestive organs: Secondary | ICD-10-CM

## 2013-03-03 DIAGNOSIS — Z8371 Family history of colonic polyps: Secondary | ICD-10-CM

## 2013-03-03 MED ORDER — SODIUM CHLORIDE 0.9 % IV SOLN: 500.0000 mL | INTRAVENOUS | Status: DC

## 2013-03-03 NOTE — Progress Notes (Signed)
Patient did not experience any of the following events: a burn prior to discharge; a fall within the facility; wrong site/side/patient/procedure/implant event; or a hospital transfer or hospital admission upon discharge from the facility. (G8907) Patient did not have preoperative order for IV antibiotic SSI prophylaxis. (G8918)  

## 2013-03-03 NOTE — Patient Instructions (Addendum)
Recall colon in 5 years.  You may resume your current medications today.  Please call if any questions or concerns.    YOU HAD AN ENDOSCOPIC PROCEDURE TODAY AT THE  ENDOSCOPY CENTER: Refer to the procedure report that was given to you for any specific questions about what was found during the examination.  If the procedure report does not answer your questions, please call your gastroenterologist to clarify.  If you requested that your care partner not be given the details of your procedure findings, then the procedure report has been included in a sealed envelope for you to review at your convenience later.  YOU SHOULD EXPECT: Some feelings of bloating in the abdomen. Passage of more gas than usual.  Walking can help get rid of the air that was put into your GI tract during the procedure and reduce the bloating. If you had a lower endoscopy (such as a colonoscopy or flexible sigmoidoscopy) you may notice spotting of blood in your stool or on the toilet paper. If you underwent a bowel prep for your procedure, then you may not have a normal bowel movement for a few days.  DIET: Your first meal following the procedure should be a light meal and then it is ok to progress to your normal diet.  A half-sandwich or bowl of soup is an example of a good first meal.  Heavy or fried foods are harder to digest and may make you feel nauseous or bloated.  Likewise meals heavy in dairy and vegetables can cause extra gas to form and this can also increase the bloating.  Drink plenty of fluids but you should avoid alcoholic beverages for 24 hours.  ACTIVITY: Your care partner should take you home directly after the procedure.  You should plan to take it easy, moving slowly for the rest of the day.  You can resume normal activity the day after the procedure however you should NOT DRIVE or use heavy machinery for 24 hours (because of the sedation medicines used during the test).    SYMPTOMS TO REPORT IMMEDIATELY: A  gastroenterologist can be reached at any hour.  During normal business hours, 8:30 AM to 5:00 PM Monday through Friday, call [phone].  After hours and on weekends, please call the GI answering service at [phone] who will take a message and have the physician on call contact you.   Following lower endoscopy (colonoscopy or flexible sigmoidoscopy):  Excessive amounts of blood in the stool  Significant tenderness or worsening of abdominal pains  Swelling of the abdomen that is new, acute  Fever of 100F or higher   FOLLOW UP: If any biopsies were taken you will be contacted by phone or by letter within the next 1-3 weeks.  Call your gastroenterologist if you have not heard about the biopsies in 3 weeks.  Our staff will call the home number listed on your records the next business day following your procedure to check on you and address any questions or concerns that you may have at that time regarding the information given to you following your procedure. This is a courtesy call and so if there is no answer at the home number and we have not heard from you through the emergency physician on call, we will assume that you have returned to your regular daily activities without incident.  SIGNATURES/CONFIDENTIALITY: You and/or your care partner have signed paperwork which will be entered into your electronic medical record.  These signatures attest to the  fact that that the information above on your After Visit Summary has been reviewed and is understood.  Full responsibility of the confidentiality of this discharge information lies with you and/or your care-partner.

## 2013-03-03 NOTE — Telephone Encounter (Signed)
Called pt and left message regarding chest x-ray on 03/10/13

## 2013-03-03 NOTE — Op Note (Signed)
[facility] [address] [place], [address]   COLONOSCOPY PROCEDURE REPORT  PATIENT: Crystal, Bass  MR#: [id number] BIRTHDATE: March 10, 1941 , 71  yrs. old GENDER: Female ENDOSCOPIST: Roxy Cedar, MD REFERRED XB:[id number]/ Recall PROCEDURE DATE:  03/03/2013 PROCEDURE:   Colonoscopy, screening ASA CLASS:   Class II INDICATIONS:Patient's immediate family history of colon cancer ([id number]-60); negative index examination August 2007. MEDICATIONS: MAC sedation, administered by CRNA and propofol (Diprivan) 200mg  IV  DESCRIPTION OF PROCEDURE:   After the risks benefits and alternatives of the procedure were thoroughly explained, informed consent was obtained.  A digital rectal exam revealed no abnormalities of the rectum.   The LB [id number]  endoscope was introduced through the anus and advanced to the cecum, which was identified by both the appendix and ileocecal valve. No adverse events experienced.   The quality of the prep was adequate, using MoviPrep  The instrument was then slowly withdrawn as the colon was fully examined.      COLON FINDINGS: A normal appearing cecum, ileocecal valve, and appendiceal orifice were identified.  The ascending, hepatic flexure, transverse, splenic flexure, descending, sigmoid colon and rectum appeared unremarkable.  No polyps or cancers were seen. Retroflexed views revealed internal hemorrhoids. The time to cecum=5 minutes 20 seconds.  Withdrawal time=10 minutes 20 seconds. The scope was withdrawn and the procedure completed. COMPLICATIONS: There were no complications.  ENDOSCOPIC IMPRESSION: 1. Normal colon  RECOMMENDATIONS: 1. Follow up colonoscopy in 5 years   eSigned:  Roxy Cedar, MD 03/03/2013 10:52 AM   cc: Pecola Lawless, MD and The Patient

## 2013-03-03 NOTE — Progress Notes (Signed)
No complaints noted in the recovery room. Maw   

## 2013-03-04 ENCOUNTER — Telehealth: Payer: Self-pay | Admitting: *Deleted

## 2013-03-04 NOTE — Telephone Encounter (Signed)
No answer, left message to call if questions or concerns. 

## 2013-03-10 ENCOUNTER — Other Ambulatory Visit (HOSPITAL_BASED_OUTPATIENT_CLINIC_OR_DEPARTMENT_OTHER): Payer: Medicare Other | Admitting: Lab

## 2013-03-10 ENCOUNTER — Ambulatory Visit (HOSPITAL_COMMUNITY)
Admission: RE | Admit: 2013-03-10 | Discharge: 2013-03-10 | Disposition: A | Discharge status: Home / Self Care | Payer: Medicare Other | Source: Ambulatory Visit | Attending: Oncology | Admitting: Oncology

## 2013-03-10 DIAGNOSIS — Z853 Personal history of malignant neoplasm of breast: Secondary | ICD-10-CM

## 2013-03-10 DIAGNOSIS — M4180 Other forms of scoliosis, site unspecified: Secondary | ICD-10-CM | POA: Insufficient documentation

## 2013-03-10 DIAGNOSIS — A310 Pulmonary mycobacterial infection: Secondary | ICD-10-CM

## 2013-03-10 DIAGNOSIS — M810 Age-related osteoporosis without current pathological fracture: Secondary | ICD-10-CM

## 2013-03-10 DIAGNOSIS — R918 Other nonspecific abnormal finding of lung field: Secondary | ICD-10-CM | POA: Insufficient documentation

## 2013-03-10 DIAGNOSIS — J449 Chronic obstructive pulmonary disease, unspecified: Secondary | ICD-10-CM | POA: Insufficient documentation

## 2013-03-10 DIAGNOSIS — I10 Essential (primary) hypertension: Secondary | ICD-10-CM | POA: Insufficient documentation

## 2013-03-10 DIAGNOSIS — J4489 Other specified chronic obstructive pulmonary disease: Secondary | ICD-10-CM | POA: Insufficient documentation

## 2013-03-10 LAB — BASIC METABOLIC PANEL (CC13)
CO2: 28 mEq/L (ref 22–29)
Glucose: 98 mg/dl (ref 70–99)
Potassium: 4.3 mEq/L (ref 3.5–5.1)
Sodium: 139 mEq/L (ref 136–145)

## 2013-03-17 ENCOUNTER — Ambulatory Visit (HOSPITAL_BASED_OUTPATIENT_CLINIC_OR_DEPARTMENT_OTHER): Payer: Medicare Other | Admitting: Oncology

## 2013-03-17 ENCOUNTER — Ambulatory Visit (HOSPITAL_BASED_OUTPATIENT_CLINIC_OR_DEPARTMENT_OTHER): Payer: Medicare Other

## 2013-03-17 VITALS — BP 138/80 | HR 67 | Temp 97.8°F | Resp 20 | Ht 63.0 in | Wt 147.5 lb

## 2013-03-17 DIAGNOSIS — Z8543 Personal history of malignant neoplasm of ovary: Secondary | ICD-10-CM

## 2013-03-17 DIAGNOSIS — A319 Mycobacterial infection, unspecified: Secondary | ICD-10-CM

## 2013-03-17 DIAGNOSIS — R911 Solitary pulmonary nodule: Secondary | ICD-10-CM

## 2013-03-17 DIAGNOSIS — Z853 Personal history of malignant neoplasm of breast: Secondary | ICD-10-CM

## 2013-03-17 DIAGNOSIS — A310 Pulmonary mycobacterial infection: Secondary | ICD-10-CM

## 2013-03-17 DIAGNOSIS — M810 Age-related osteoporosis without current pathological fracture: Secondary | ICD-10-CM

## 2013-03-17 MED ORDER — ZOLEDRONIC ACID 4 MG/100ML IV SOLN
4.0000 mg | Freq: Once | INTRAVENOUS | Status: AC
  Administered 2013-03-17: 4 mg via INTRAVENOUS
  Filled 2013-03-17: qty 100

## 2013-03-17 MED ORDER — SODIUM CHLORIDE 0.9 % IV SOLN
Freq: Once | INTRAVENOUS | Status: AC
  Administered 2013-03-17: 17:00:00 via INTRAVENOUS

## 2013-03-17 NOTE — Progress Notes (Signed)
Hematology and Oncology Follow Up Visit  Crystal Bass [id number] 02-Jul-1941 72 y.o. 03/17/2013 6:52 PM   Principle Diagnosis: Encounter Diagnoses  Name Primary?  [name] BREAST CANCER, HX OF   . ATYPICAL MYCOBACTERIAL INFECTION Yes     Interim History:   Followup visit for this 72 year old woman with a history of stage I ER positive cancer of the left breast diagnosed in January 2002. She was treated with a left modified radical mastectomy then initial tamoxifen hormonal therapy and then transitioned to an aromatase inhibitor which she took through 2012.[name] She had a single small pulmonary lesion that was positive on a PET scan at time of diagnosis.. It was surgically resected and found to be an area of Mycobacterium avium tuberculosis.  CT scan of the chest shows multiple bilateral areas of nodular pulmonary infiltrates which have fluctuated over time. She has been treated on at least 2 or 3 occasions for [name]. She has a chronic cough, and I believe that she has bronchiectasis. She is not immunocompromised as far as we can tell.  Her 16 year old mother in law expired in January. Ivet was her exclusive caregiver.  She finally elected to use bronchodilators to help her breathing. She is to follow closely by pulmonary.    Medications: reviewed  Allergies:  Allergies  Allergen Reactions  . Sulfonamide Derivatives     REACTION: anaphylaxis  . Hydrocodone Other (See Comments)    "hyper and climbing the walls"  . Tape Other (See Comments)    REDNESS  . Tetracyclines & Related     "immediate yeast infection"    Review of Systems: Constitutional:   Generalized fatigue Respiratory: Chronic dyspnea on mild exertion, chronic cough Cardiovascular:  No chest pain or palpitations Gastrointestinal: She had a colonoscopy by Dr. Marina Goodell 2 weeks ago with normal findings Genito-Urinary: No urinary tract symptoms Musculoskeletal: No muscle bone or joint pain Neurologic: No headache or change in  vision Skin: No rash Remaining ROS negative.  Physical Exam: Blood pressure 138/80, pulse 67, temperature 97.8 F (36.6 C), temperature source Oral, resp. rate 20, height 5\' 3"  (1.6 m), weight 147 lb 8 oz (66.906 kg). Wt Readings from Last 3 Encounters:  03/17/13 147 lb 8 oz (66.906 kg)  03/03/13 145 lb (65.772 kg)  02/17/13 145 lb 6.4 oz (65.953 kg)     General appearance: Thin Caucasian woman HENNT: Pharynx no erythema or exudate Lymph nodes: No adenopathy Breasts: Left mastectomy, no right breast masses Lungs: Clear to auscultation resonant to percussion Heart: Regular rhythm Abdomen: Soft nontender, no mass, no organomegaly Extremities: No edema, no calf tenderness Vascular: No cyanosis Neurologic: No focal deficit Skin: No rash or ecchymosis  Lab Results: Lab Results  Component Value Date   WBC 7.2 09/09/2012   HGB 13.1 09/09/2012   HCT 38.8 09/09/2012   MCV 95.1 09/09/2012   PLT 220 09/09/2012     Chemistry      Component Value Date/Time   NA 139 03/10/2013 1529   NA 140 03/11/2012 1346   K 4.3 03/10/2013 1529   K 4.2 03/11/2012 1346   CL 102 03/10/2013 1529   CL 106 03/11/2012 1346   CO2 28 03/10/2013 1529   CO2 28 03/11/2012 1346   BUN 20.7 03/10/2013 1529   BUN 24* 03/11/2012 1346   CREATININE 0.8 03/10/2013 1529   CREATININE 0.80 03/11/2012 1346      Component Value Date/Time   CALCIUM 9.8 03/10/2013 1529   CALCIUM 9.5 03/11/2012 1346   ALKPHOS  76 09/09/2012 1437   ALKPHOS 70 03/11/2012 1346   AST 22 09/09/2012 1437   AST 18 03/11/2012 1346   ALT 20 09/09/2012 1437   ALT 13 03/11/2012 1346   BILITOT 1.00 09/09/2012 1437   BILITOT 0.7 03/11/2012 1346       Radiological Studies: I personally reviewed these images. Dg Chest 2 View  03/10/2013  *RADIOLOGY REPORT*  Clinical Data: Pulmonary nodules, history Mycobacterium avium intracellulare, COPD, asthma, hypertension  CHEST - 2 VIEW  Comparison: 10/01/2012  Findings: Upper-normal size of cardiac silhouette.  Mediastinal contours and pulmonary vascularity normal. Prior right upper lobe resection. Emphysematous and minimal bronchitic changes. Question nodular density lower left chest 10 mm diameter versus nipple shadow. No definite acute infiltrate, pleural effusion or pneumothorax. Bones diffusely demineralized. Levoconvex thoracic scoliosis.  IMPRESSION: Emphysematous, bronchitic and postsurgical changes as above. Question left nipple shadow versus nodular density; repeat PA chest radiograph with nipple markers recommended to exclude pulmonary nodule.   Original Report Authenticated By: Ulyses Southward, M.D.    Mm Digital Screening Unilat R  02/20/2013  *RADIOLOGY REPORT*  Clinical Data: Screening.  MAMMOGRAPHIC UNILATERAL RIGHT DIGITAL SCREENING WITH CAD  Comparison:  Previous exams.  FINDINGS:  ACR Breast Density Category 3: The breast tissue is heterogeneously dense.  No suspicious masses, architectural distortion, or calcifications are present.  Images were processed with CAD.  IMPRESSION: No mammographic evidence of malignancy.  A result letter of this screening mammogram will be mailed directly to the patient.  RECOMMENDATION: Screening mammogram in one year. (Code:SM-B-01Y)  BI-RADS CATEGORY 1:  Negative.   Original Report Authenticated By: Christiana Pellant, M.D.     Impression and Plan: #1. Stage I, ER positive breast cancer treated as outlined above. No evidence for recurrence now out over 12 years.  #2. Chronic [name] infection  #3. Chronic nodular infiltrates Patient has a left mastectomy so nodular shadow seen on recent chest x-ray likely one of her chronic pulmonary nodules. I will get a followup CT scan in August.  #4. Progressive respiratory deterioration over time  #5. History of migraine headaches  #6. Low grade ovarian cancer status post hysterectomy plus of rectum in May 2004  #7 essential hypertension  #8. 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     Levert Feinstein, MD 4/22/20146:52 PM

## 2013-03-17 NOTE — Patient Instructions (Signed)
Zoledronic Acid injection (Hypercalcemia, Oncology) What is this medicine? ZOLEDRONIC ACID (ZOE le dron ik AS id) lowers the amount of calcium loss from bone. It is used to treat too much calcium in your blood from cancer. It is also used to prevent complications of cancer that has spread to the bone. This medicine may be used for other purposes; ask your health care provider or pharmacist if you have questions. What should I tell my health care provider before I take this medicine? They need to know if you have any of these conditions: -aspirin-sensitive asthma -dental disease -kidney disease -an unusual or allergic reaction to zoledronic acid, other medicines, foods, dyes, or preservatives -pregnant or trying to get pregnant -breast-feeding How should I use this medicine? This medicine is for infusion into a vein. It is given by a health care professional in a hospital or clinic setting. Talk to your pediatrician regarding the use of this medicine in children. Special care may be needed. Overdosage: If you think you have taken too much of this medicine contact a poison control center or emergency room at once. NOTE: This medicine is only for you. Do not share this medicine with others. What if I miss a dose? It is important not to miss your dose. Call your doctor or health care professional if you are unable to keep an appointment. What may interact with this medicine? -certain antibiotics given by injection -NSAIDs, medicines for pain and inflammation, like ibuprofen or naproxen -some diuretics like bumetanide, furosemide -teriparatide -thalidomide This list may not describe all possible interactions. Give your health care provider a list of all the medicines, herbs, non-prescription drugs, or dietary supplements you use. Also tell them if you smoke, drink alcohol, or use illegal drugs. Some items may interact with your medicine. What should I watch for while using this medicine? Visit  your doctor or health care professional for regular checkups. It may be some time before you see the benefit from this medicine. Do not stop taking your medicine unless your doctor tells you to. Your doctor may order blood tests or other tests to see how you are doing. Women should inform their doctor if they wish to become pregnant or think they might be pregnant. There is a potential for serious side effects to an unborn child. Talk to your health care professional or pharmacist for more information. You should make sure that you get enough calcium and vitamin D while you are taking this medicine. Discuss the foods you eat and the vitamins you take with your health care professional. Some people who take this medicine have severe bone, joint, and/or muscle pain. This medicine may also increase your risk for a broken thigh bone. Tell your doctor right away if you have pain in your upper leg or groin. Tell your doctor if you have any pain that does not go away or that gets worse. What side effects may I notice from receiving this medicine? Side effects that you should report to your doctor or health care professional as soon as possible: -allergic reactions like skin rash, itching or hives, swelling of the face, lips, or tongue -anxiety, confusion, or depression -breathing problems -changes in vision -feeling faint or lightheaded, falls -jaw burning, cramping, pain -muscle cramps, stiffness, or weakness -trouble passing urine or change in the amount of urine Side effects that usually do not require medical attention (report to your doctor or health care professional if they continue or are bothersome): -bone, joint, or muscle pain -  fever -hair loss -irritation at site where injected -loss of appetite -nausea, vomiting -stomach upset -tired This list may not describe all possible side effects. Call your doctor for medical advice about side effects. You may report side effects to FDA at  1-800-FDA-1088. Where should I keep my medicine? This drug is given in a hospital or clinic and will not be stored at home. NOTE: This sheet is a summary. It may not cover all possible information. If you have questions about this medicine, talk to your doctor, pharmacist, or health care provider.  2013, [employer]. (05/11/2011 9:06:58 AM)  

## 2013-03-18 ENCOUNTER — Telehealth: Payer: Self-pay | Admitting: Oncology

## 2013-03-18 ENCOUNTER — Telehealth: Payer: Self-pay | Admitting: *Deleted

## 2013-03-18 NOTE — Telephone Encounter (Signed)
lomonvm advising the pt of her lab appt in aug. Advised the pt that she will be receiving a call from the rad dept regarding her ct scan appt for aug.

## 2013-03-18 NOTE — Telephone Encounter (Signed)
Per staff message and POF I have scheduled appts.  JMW  

## 2013-03-20 ENCOUNTER — Telehealth: Payer: Self-pay | Admitting: Oncology

## 2013-03-20 NOTE — Telephone Encounter (Signed)
Received message from central that pt wants ct @ [facility]. S/w pt re appt for ct 8/19 @ 3pm @ [address]. Pt wanted to know if there was any availability @ [address] and was given the number for [facility] to call and check.

## 2013-03-27 ENCOUNTER — Other Ambulatory Visit: Payer: Self-pay | Admitting: Internal Medicine

## 2013-04-13 ENCOUNTER — Encounter: Payer: Self-pay | Admitting: Obstetrics & Gynecology

## 2013-04-14 ENCOUNTER — Encounter: Payer: Self-pay | Admitting: Obstetrics & Gynecology

## 2013-04-14 ENCOUNTER — Ambulatory Visit (INDEPENDENT_AMBULATORY_CARE_PROVIDER_SITE_OTHER): Payer: 59 | Admitting: Obstetrics & Gynecology

## 2013-04-14 VITALS — BP 148/86 | Ht 62.75 in | Wt 147.2 lb

## 2013-04-14 DIAGNOSIS — Z8543 Personal history of malignant neoplasm of ovary: Secondary | ICD-10-CM

## 2013-04-14 DIAGNOSIS — M810 Age-related osteoporosis without current pathological fracture: Secondary | ICD-10-CM

## 2013-04-14 DIAGNOSIS — Z01419 Encounter for gynecological examination (general) (routine) without abnormal findings: Secondary | ICD-10-CM

## 2013-04-14 NOTE — Patient Instructions (Addendum)

## 2013-04-14 NOTE — Progress Notes (Signed)
72 y.o. G0P0000 WidowedCaucasianF here for annual exam.  Doing well.  Lungs have been stable.  No infections over the winter.  Has been on Claritin due to allergies.  No LMP recorded. Patient has had a hysterectomy.          Sexually active: no  The current method of family planning is status post hysterectomy.    Exercising: no  not regularly Smoker:  no  Health Maintenance: Pap:  10/06/08 WNL History of abnormal Pap:  no MMG:  02/20/13 normal Colonoscopy:  2014 repeat 5 years. Dr Marina Goodell (reveiwed in [facility]) BMD:   04/04/11 osteopenia in hip, osteoporosis in spine TDaP:  Up to date Screening Labs: PCP, Hb today: PCP, Urine today: PCP   reports that she has never smoked. She has never used smokeless tobacco. She reports that she does not drink alcohol or use illicit drugs.  Past Medical History  Diagnosis Date  . Pancreatitis     secondary to Cholelithiasis  . Breast cancer     infiltrative ductal carcinoma 2002  . Lesion of breast 1992    right, benign  . Post-thoracotomy pain syndrome   . Uterine fibroid   . Endometriosis   . Ovarian cancer 2004  . 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 Osteoporosis, post-menopausal 03/14/2012  . Ductal carcinoma of breast, estrogen receptor positive, stage 1 09/16/2012  . Pseudomonas pneumonia 5/09  . COPD (chronic obstructive pulmonary disease) 9/08  . Mastalgia 7/95    Past Surgical History  Procedure Laterality Date  . Lung biopsy  2002    [name], Dr Edwyna Shell  . Cardiac catheterization  2007    essentially negation for significant CAD  . Appendectomy  2004  . Cholecystectomy  2001  . 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 Breast lumpectomy  1992    benign  . Mastectomy modified radical  2002    oral chemotheraphy (tamoxifen then Armidex) no radiation, Dr.[name]  . Tonsillectomy  1946  . Breast biopsy  10/05    right breast-benign    Current Outpatient Prescriptions   Medication Sig Dispense Refill  . acetaminophen (TYLENOL) 325 MG tablet Take 1,000 mg by mouth 2 (two) times daily.       [name] albuterol (PROAIR HFA) 108 (90 BASE) MCG/ACT inhaler Inhale 2 puffs into the lungs every 6 (six) hours as needed.  1 Inhaler  3  . aspirin 81 MG tablet Take 81 mg by mouth daily.        . budesonide-formoterol (SYMBICORT) 160-4.5 MCG/ACT inhaler Inhale 2 puffs into the lungs 2 (two) times daily.  1 Inhaler  6  . Calcium Carbonate-Vit D-Min (CALCIUM 600 + MINERALS) 600-200 MG-UNIT TABS Take 1 tablet by mouth 2 (two) times daily.      . fexofenadine (ALLEGRA) 180 MG tablet Take 180 mg by mouth daily as needed.       . gabapentin ([name]) 300 MG capsule TAKE 1 TO 3 CAPSULES BY MOUTH EVEYR 8 HOURS AS NEEDED  270 capsule  2  . IMITREX 100 MG tablet Take 100 mg by mouth every 2 (two) hours as needed.       . loratadine (CLARITIN) 10 MG tablet Take 10 mg by mouth as needed for allergies.      [name] losartan (COZAAR) 50 MG tablet TAKE 1 TABLET EVERY DAY  90 tablet  1  . metoprolol  tartrate (LOPRESSOR) 25 MG tablet APPOINTMENT DUE, 1 by mouth twice a day  60 tablet  0  . Multiple Vitamin (MULTIVITAMIN) capsule Take 1 capsule by mouth daily.      . Vitamin D, Ergocalciferol, ([name]) 50000 UNITS CAPS Take 1 capsule by mouth once a week.      . zolendronic acid (ZOMETA) 4 MG/5ML injection Inject 4 mg into the vein every 6 (six) months.        . benzonatate (TESSALON) 200 MG capsule Take 1 capsule (200 mg total) by mouth 3 (three) times daily as needed for cough.  30 capsule  1   No current facility-administered medications for this visit.    Family History  Problem Relation Age of Onset  . Lung cancer Father   . Breast cancer Sister   . Colon cancer Sister   . Stroke Paternal Aunt     CVA  . Osteoporosis Paternal Aunt   . Myasthenia gravis Paternal Aunt   . Emphysema Mother     was never a smoker    ROS:  Pertinent items are noted in HPI.  Otherwise, a comprehensive ROS  was negative.  Exam:   BP 148/86  Ht 5' 2.75" (1.594 m)  Wt 147 lb 3.2 oz (66.769 kg)  BMI 26.28 kg/m2  Weight change: +4lbs  Height: 5' 2.75" (159.4 cm)  Ht Readings from Last 3 Encounters:  04/14/13 5' 2.75" (1.594 m)  03/17/13 5\' 3"  (1.6 m)  03/03/13 5\' 3"  (1.6 m)    General appearance: alert, cooperative and appears stated age Head: Normocephalic, without obvious abnormality, atraumatic Neck: no adenopathy, supple, symmetrical, trachea midline and thyroid normal to inspection and palpation Lungs: clear to auscultation bilaterally Breasts: normal appearance, no masses or tenderness on right, absent left breast Heart: regular rate and rhythm Abdomen: soft, non-tender; bowel sounds normal; no masses,  no organomegaly Extremities: extremities normal, atraumatic, no cyanosis or edema Skin: Skin color, texture, turgor normal. No rashes or lesions Lymph nodes: Cervical, supraclavicular, and axillary nodes normal. No abnormal inguinal nodes palpated Neurologic: Grossly normal   Pelvic: External genitalia:  no lesions              Urethra:  normal appearing urethra with no masses, tenderness or lesions              Bartholins and Skenes: normal                 Vagina: normal appearing vagina with normal color and discharge, no lesions              Cervix: absent              Pap taken: no Bimanual Exam:  Uterus:  uterus absent              Adnexa: no mass, fullness, tenderness               Rectovaginal: Confirms               Anus:  normal sphincter tone, no lesions  A:  Well Woman with normal exam H/o borderling mucinous ovarian ca IC H/O breast cancer 1/02 Osteoporosis Vit D Elevated lipids  P:   Mammogram yearly No pap smear Ca-125 Labs with Dr. Alwyn Ren BMD with VFA ordered at breast center return annually or prn  An After Visit Summary was printed and given to the patient.

## 2013-04-15 LAB — CA 125: CA 125: 10 U/mL (ref 0.0–30.2)

## 2013-04-27 ENCOUNTER — Encounter: Payer: Self-pay | Admitting: Internal Medicine

## 2013-04-27 ENCOUNTER — Ambulatory Visit (INDEPENDENT_AMBULATORY_CARE_PROVIDER_SITE_OTHER): Payer: Medicare Other | Admitting: Internal Medicine

## 2013-04-27 VITALS — BP 130/84 | HR 58 | Temp 98.1°F | Resp 12 | Ht 62.08 in | Wt 148.0 lb

## 2013-04-27 DIAGNOSIS — Z0000 Encounter for general adult medical examination without abnormal findings: Secondary | ICD-10-CM

## 2013-04-27 DIAGNOSIS — E559 Vitamin D deficiency, unspecified: Secondary | ICD-10-CM | POA: Insufficient documentation

## 2013-04-27 DIAGNOSIS — I10 Essential (primary) hypertension: Secondary | ICD-10-CM

## 2013-04-27 DIAGNOSIS — M5414 Radiculopathy, thoracic region: Secondary | ICD-10-CM

## 2013-04-27 DIAGNOSIS — IMO0002 Reserved for concepts with insufficient information to code with codable children: Secondary | ICD-10-CM

## 2013-04-27 MED ORDER — LOSARTAN POTASSIUM 50 MG PO TABS: ORAL_TABLET | ORAL | Status: DC

## 2013-04-27 MED ORDER — METOPROLOL TARTRATE 25 MG PO TABS: ORAL_TABLET | ORAL | Status: DC

## 2013-04-27 MED ORDER — GABAPENTIN 300 MG PO CAPS: ORAL_CAPSULE | ORAL | Status: DC

## 2013-04-27 NOTE — Patient Instructions (Addendum)
Please  have fasting Labs done @ next appt with Dr Cyndie Chime: Lipids, TSH. PLEASE BRING THESE INSTRUCTIONS TO FOLLOW UP  LAB APPOINTMENT.This will guarantee correct labs are drawn, eliminating need for repeat blood sampling ( needle sticks ! ). Diagnoses /Codes: 272.4  To prevent tremor  avoid stimulants such as decongestants, diet pills, nicotine, or caffeine (coffee, tea, cola, or chocolate) to excess.  Ideal is a BP  AVERAGE < 135/85. This AVERAGE should be calculated from @ least 5-7 BP readings taken @ different times of day on different days of week. You should not respond to isolated BP readings , but rather the AVERAGE for that week .Please bring your  blood pressure cuff to office visits to verify that it is reliable.It  can also be checked against the blood pressure device at the pharmacy. Finger or wrist cuffs are not dependable; an arm cuff is. Take the EKG to any emergency room or preop visits. There are STABLE,nonspecific changes; as long as there is no new change these are not clinically significant . If the old EKG is not available for comparison; it may result in unnecessary hospitalization for observation with significant unnecessary expense.

## 2013-04-27 NOTE — Progress Notes (Signed)
Subjective:    Patient ID: Crystal Bass, female    DOB: 08/06/1941, 72 y.o.   MRN: [id number]  HPI Medicare Wellness Visit:  Psychosocial & medical history were reviewed as required by Medicare (abuse,antisocial behavioral risks,firearm risk).  Social history: caffeine:2 diet colas / day  , alcohol: no  ,  tobacco use: never   Exercise :  See below Home & personal  safety / fall risk: only climbing Limitations of activities of daily living:no Seatbelt  and smoke alarm use:yes Power of Attorney/Living Will status : in place Ophthalmology exam status :2014 Hearing evaluation status:no Orientation :oriented X 3 Memory & recall :good Spelling  testing:good Mood & affect/ depression / anxiety:no issues Foreign travel history : 2002 [place] Immunization status for Shingles /Flu/ PNA/ tetanus :current Transfusion history:  no Preventive health surveillance status of colonoscopy, BMD , mammograms,PAP as per protocol/ WUJ:[id number] Dental care:  Every 6 mos Chart reviewed &  Updated. Active issues reviewed & addressed.      Review of Systems Blood pressure is 120-130/80 at home.  Epistaxis,  lightheadedness, chest pain, palpitations, significant edema, and claudication are not present Chronic migraine headaches.Chronic dyspnea on exertion; she is seeing Pulmonologist. Medication compliance is good.  Adverse medication effects are not described  Sodium restriction is employed  No cardiovascular exercise @ this time.  She has had mild elevation of LDL in the past. This is not been checked for several years. Additionally her TSH has been therapeutic but it has not been rechecked for least 2 years. Her renal function was normal in April          Objective:   Physical Exam Gen.:  well-nourished in appearance. Alert, appropriate and cooperative throughout exam. Head: Normocephalic without obvious abnormalities; hair fine Eyes: No corneal or conjunctival inflammation noted.  Extraocular motion intact. Vision grossly normal with lenses Ears: External  ear exam reveals no significant lesions or deformities. Canals clear .TMs normal. Hearing is grossly normal bilaterally. Nose: External nasal exam reveals no deformity or inflammation. Nasal mucosa are pink and moist. No lesions or exudates noted.   Mouth: Oral mucosa and oropharynx reveal no lesions or exudates. Teeth in good repair. Neck: No deformities, masses, or tenderness noted. Range of motion decreased to L. Thyroid normal. Lungs: Normal respiratory effort; chest expands symmetrically. Lungs are clear to auscultation without rales, wheezes, or increased work of breathing. Heart: Normal rate and rhythm. Normal S1 and S2. No gallop, click, or rub. S4 w/o murmur. Abdomen: Bowel sounds normal; abdomen soft and nontender. No masses, organomegaly or hernias noted. Genitalia: As per Gyn                                  Musculoskeletal/extremities: Slight  accentuated curvature of upper thoracic  Spine. No clubbing, cyanosis, edema, or significant extremity  deformity noted. Range of motion normal .Tone & strength  Normal. Joints normal . Nail health good. Able to lie down & sit up w/o help. Negative SLR bilaterally Vascular: Carotid, radial artery,  and  posterior tibial pulses are full and equal.Decreased DPP. No bruits present. Neurologic: Alert and oriented x3. Deep tendon reflexes symmetrical and normal.       Skin: Intact without suspicious lesions or rashes. Lymph: No cervical, axillary lymphadenopathy present. Psych: Mood and affect are normal. Normally interactive  Assessment & Plan:  #1 Medicare Wellness Exam; criteria met ; data entered #2 Problem List/Diagnoses reviewed Plan:  Assessments made/ Orders entered  

## 2013-04-29 ENCOUNTER — Ambulatory Visit
Admission: RE | Admit: 2013-04-29 | Discharge: 2013-04-29 | Disposition: A | Discharge status: Home / Self Care | Payer: Medicare Other | Source: Ambulatory Visit | Attending: Obstetrics & Gynecology | Admitting: Obstetrics & Gynecology

## 2013-04-29 DIAGNOSIS — M810 Age-related osteoporosis without current pathological fracture: Secondary | ICD-10-CM

## 2013-06-22 ENCOUNTER — Other Ambulatory Visit: Payer: Self-pay | Admitting: Obstetrics & Gynecology

## 2013-06-23 NOTE — Telephone Encounter (Signed)
eScribe request for refill on VITAMIN D 16109 Last filled - ? Last AEX - 04/14/13 Last Vitamin D -09/09/12 = 52 Next AEX - 05/06/14  Please advise refills.  Paper chart on your desk.  Thanks.

## 2013-07-14 ENCOUNTER — Ambulatory Visit
Admission: RE | Admit: 2013-07-14 | Discharge: 2013-07-14 | Disposition: A | Discharge status: Home / Self Care | Payer: Medicare Other | Source: Ambulatory Visit | Attending: Oncology | Admitting: Oncology

## 2013-07-14 ENCOUNTER — Other Ambulatory Visit (HOSPITAL_BASED_OUTPATIENT_CLINIC_OR_DEPARTMENT_OTHER): Payer: Medicare Other | Admitting: Lab

## 2013-07-14 DIAGNOSIS — A319 Mycobacterial infection, unspecified: Secondary | ICD-10-CM

## 2013-07-14 DIAGNOSIS — Z853 Personal history of malignant neoplasm of breast: Secondary | ICD-10-CM

## 2013-07-14 LAB — COMPREHENSIVE METABOLIC PANEL (CC13)
Albumin: 4.1 g/dL (ref 3.5–5.0)
BUN: 22 mg/dL (ref 7.0–26.0)
CO2: 25 mEq/L (ref 22–29)
Calcium: 10.8 mg/dL — ABNORMAL HIGH (ref 8.4–10.4)
Chloride: 102 mEq/L (ref 98–109)
Creatinine: 0.9 mg/dL (ref 0.6–1.1)
Glucose: 94 mg/dl (ref 70–140)
Potassium: 4.5 mEq/L (ref 3.5–5.1)

## 2013-07-14 MED ORDER — IOHEXOL 300 MG/ML  SOLN
75.0000 mL | Freq: Once | INTRAMUSCULAR | Status: AC | PRN
  Administered 2013-07-14: 75 mL via INTRAVENOUS

## 2013-07-16 ENCOUNTER — Telehealth: Payer: Self-pay | Admitting: *Deleted

## 2013-07-16 NOTE — Telephone Encounter (Signed)
Spoke with patient.  Let her know her CT chest was stable and that her calcium was running a little high.  Dr. Cyndie Chime said he would discuss with her at time of visit.  She is already set up to have zometa that day.  She appreciated the call.

## 2013-08-18 ENCOUNTER — Telehealth: Payer: Self-pay | Admitting: Internal Medicine

## 2013-08-18 ENCOUNTER — Ambulatory Visit (INDEPENDENT_AMBULATORY_CARE_PROVIDER_SITE_OTHER): Payer: Medicare Other

## 2013-08-18 DIAGNOSIS — Z23 Encounter for immunization: Secondary | ICD-10-CM

## 2013-08-18 NOTE — Telephone Encounter (Signed)
08/18/2013  Pt came in for her flu shot.  York Spaniel he would like to know her lab results from 07/14/2013.  Please advise.  bw

## 2013-08-20 NOTE — Telephone Encounter (Signed)
Patient says that she had CPE labs drawn at [facility] on 8/19 and she is calling for these results. She states that [facility] is faxing results to our office today for the 2nd time. Patient would like a call once results are reviewed.

## 2013-08-26 NOTE — Telephone Encounter (Signed)
Spoke with the pt and informed her that we have not seen the results from [facility].  Pt stated that she will call [facility] again and have them refax it, and I gave the pt the fax# to the back fax.//AB/CMA

## 2013-09-14 ENCOUNTER — Ambulatory Visit (HOSPITAL_BASED_OUTPATIENT_CLINIC_OR_DEPARTMENT_OTHER): Payer: Medicare Other | Admitting: Lab

## 2013-09-14 ENCOUNTER — Ambulatory Visit (HOSPITAL_BASED_OUTPATIENT_CLINIC_OR_DEPARTMENT_OTHER): Payer: Medicare Other | Admitting: Oncology

## 2013-09-14 ENCOUNTER — Ambulatory Visit (HOSPITAL_BASED_OUTPATIENT_CLINIC_OR_DEPARTMENT_OTHER): Payer: Medicare Other

## 2013-09-14 ENCOUNTER — Encounter: Payer: Self-pay | Admitting: Oncology

## 2013-09-14 VITALS — BP 140/83 | HR 62 | Temp 98.7°F | Resp 18 | Ht 62.0 in | Wt 153.5 lb

## 2013-09-14 DIAGNOSIS — M810 Age-related osteoporosis without current pathological fracture: Secondary | ICD-10-CM

## 2013-09-14 DIAGNOSIS — A319 Mycobacterial infection, unspecified: Secondary | ICD-10-CM

## 2013-09-14 DIAGNOSIS — I10 Essential (primary) hypertension: Secondary | ICD-10-CM

## 2013-09-14 DIAGNOSIS — C50912 Malignant neoplasm of unspecified site of left female breast: Secondary | ICD-10-CM

## 2013-09-14 DIAGNOSIS — Z853 Personal history of malignant neoplasm of breast: Secondary | ICD-10-CM

## 2013-09-14 DIAGNOSIS — Z8543 Personal history of malignant neoplasm of ovary: Secondary | ICD-10-CM

## 2013-09-14 DIAGNOSIS — R918 Other nonspecific abnormal finding of lung field: Secondary | ICD-10-CM

## 2013-09-14 HISTORY — DX: Hypercalcemia: E83.52

## 2013-09-14 LAB — COMPREHENSIVE METABOLIC PANEL (CC13)
ALT: 12 U/L (ref 0–55)
AST: 18 U/L (ref 5–34)
Albumin: 4 g/dL (ref 3.5–5.0)
Calcium: 9.6 mg/dL (ref 8.4–10.4)
Chloride: 105 mEq/L (ref 98–109)
Potassium: 3.8 mEq/L (ref 3.5–5.1)
Total Bilirubin: 0.89 mg/dL (ref 0.20–1.20)

## 2013-09-14 MED ORDER — SODIUM CHLORIDE 0.9 % IV SOLN
Freq: Once | INTRAVENOUS | Status: AC
  Administered 2013-09-14: 17:00:00 via INTRAVENOUS

## 2013-09-14 MED ORDER — ZOLEDRONIC ACID 4 MG/100ML IV SOLN
4.0000 mg | Freq: Once | INTRAVENOUS | Status: AC
  Administered 2013-09-14: 4 mg via INTRAVENOUS
  Filled 2013-09-14: qty 100

## 2013-09-14 NOTE — Patient Instructions (Signed)
Zoledronic Acid injection (Hypercalcemia, Oncology) What is this medicine? ZOLEDRONIC ACID (ZOE le dron ik AS id) lowers the amount of calcium loss from bone. It is used to treat too much calcium in your blood from cancer. It is also used to prevent complications of cancer that has spread to the bone. This medicine may be used for other purposes; ask your health care provider or pharmacist if you have questions. What should I tell my health care provider before I take this medicine? They need to know if you have any of these conditions: -aspirin-sensitive asthma -dental disease -kidney disease -an unusual or allergic reaction to zoledronic acid, other medicines, foods, dyes, or preservatives -pregnant or trying to get pregnant -breast-feeding How should I use this medicine? This medicine is for infusion into a vein. It is given by a health care professional in a hospital or clinic setting. Talk to your pediatrician regarding the use of this medicine in children. Special care may be needed. Overdosage: If you think you have taken too much of this medicine contact a poison control center or emergency room at once. NOTE: This medicine is only for you. Do not share this medicine with others. What if I miss a dose? It is important not to miss your dose. Call your doctor or health care professional if you are unable to keep an appointment. What may interact with this medicine? -certain antibiotics given by injection -NSAIDs, medicines for pain and inflammation, like ibuprofen or naproxen -some diuretics like bumetanide, furosemide -teriparatide -thalidomide This list may not describe all possible interactions. Give your health care provider a list of all the medicines, herbs, non-prescription drugs, or dietary supplements you use. Also tell them if you smoke, drink alcohol, or use illegal drugs. Some items may interact with your medicine. What should I watch for while using this medicine? Visit  your doctor or health care professional for regular checkups. It may be some time before you see the benefit from this medicine. Do not stop taking your medicine unless your doctor tells you to. Your doctor may order blood tests or other tests to see how you are doing. Women should inform their doctor if they wish to become pregnant or think they might be pregnant. There is a potential for serious side effects to an unborn child. Talk to your health care professional or pharmacist for more information. You should make sure that you get enough calcium and vitamin D while you are taking this medicine. Discuss the foods you eat and the vitamins you take with your health care professional. Some people who take this medicine have severe bone, joint, and/or muscle pain. This medicine may also increase your risk for a broken thigh bone. Tell your doctor right away if you have pain in your upper leg or groin. Tell your doctor if you have any pain that does not go away or that gets worse. What side effects may I notice from receiving this medicine? Side effects that you should report to your doctor or health care professional as soon as possible: -allergic reactions like skin rash, itching or hives, swelling of the face, lips, or tongue -anxiety, confusion, or depression -breathing problems -changes in vision -feeling faint or lightheaded, falls -jaw burning, cramping, pain -muscle cramps, stiffness, or weakness -trouble passing urine or change in the amount of urine Side effects that usually do not require medical attention (report to your doctor or health care professional if they continue or are bothersome): -bone, joint, or muscle pain -  fever -hair loss -irritation at site where injected -loss of appetite -nausea, vomiting -stomach upset -tired This list may not describe all possible side effects. Call your doctor for medical advice about side effects. You may report side effects to FDA at  1-800-FDA-1088. Where should I keep my medicine? This drug is given in a hospital or clinic and will not be stored at home. NOTE: This sheet is a summary. It may not cover all possible information. If you have questions about this medicine, talk to your doctor, pharmacist, or health care provider.  2013, [employer]. (05/11/2011 9:06:58 AM)  

## 2013-09-15 ENCOUNTER — Telehealth: Payer: Self-pay | Admitting: *Deleted

## 2013-09-15 ENCOUNTER — Telehealth: Payer: Self-pay | Admitting: Oncology

## 2013-09-15 LAB — PTH, INTACT AND CALCIUM
Calcium: 9.3 mg/dL (ref 8.4–10.5)
PTH: 69.3 pg/mL (ref 14.0–72.0)

## 2013-09-15 LAB — CALCIUM, IONIZED: Calcium, Ion: 1.28 mmol/L (ref 1.12–1.32)

## 2013-09-15 NOTE — Telephone Encounter (Signed)
Per staff message and POF I have scheduled appts.  JMW  

## 2013-09-15 NOTE — Telephone Encounter (Signed)
lmonvm adviisng the pt of her mammograM APPT IN MARCH 2015 AT [facility] ALONG WITH THE F/U APPT TO SEE THE MD. SCHEDULED THE PT HER LAB APPT AND SHE WILL RECEIVE A CALL FROM THE RAD DEPT REGARDING HER CT SCAN.

## 2013-09-16 NOTE — Progress Notes (Signed)
Hematology and Oncology Follow Up Visit  Crystal Bass [id number] 12/01/1940 72 y.o. 09/16/2013 1:48 PM   Principle Diagnosis: Encounter Diagnoses  Name Primary?  [name] BREAST CANCER, HX OF   . Ductal carcinoma of breast, estrogen receptor positive, stage 1, left   . ATYPICAL MYCOBACTERIAL INFECTION Yes  . Osteoporosis, post-menopausal   . Hypercalcemia      Interim History:    Followup visit for this 72 year old woman with a history of stage I ER positive cancer of the left breast diagnosed in January 2002. She was treated with a left modified radical mastectomy then initial tamoxifen hormonal therapy and then transitioned to an aromatase inhibitor which she took through 2012.[name] She had a single small pulmonary lesion that was positive on a PET scan at time of diagnosis.. It was surgically resected and found to be an area of Mycobacterium avium tuberculosis. CT scan of the chest shows multiple bilateral areas of nodular pulmonary infiltrates which have fluctuated over time. She has been treated on at least 2 or 3 occasions for [name]. She has a chronic cough, and I believe that she has bronchiectasis. She is not immunocompromised as far as I can tell.  She's had no interim medical problems. Appetite has improved. Weight is coming up. Weight last year down as low as 139. Current weight 153.5. I think this reflects a lot of stress relief now that her elderly, demented, mother-in-law has passed since Arnetta was her exclusive caregiver.   Medications: reviewed  Allergies:  Allergies  Allergen Reactions  . Sulfonamide Derivatives     REACTION: anaphylaxis  . Hydrocodone Other (See Comments)    "hyper and climbing the walls"  . Biaxin [Clarithromycin]   . Motrin [Ibuprofen]     headaches  . Topamax [Topiramate]     Metabolic acidosis   . Percocet [Oxycodone-Acetaminophen] Itching and Rash  . Tape Other (See Comments)    REDNESS  . Tetracyclines & Related     "immediate yeast  infection"    Review of Systems: Hematology: negative for swollen glands, easy bruising, ENT ROS: negative for - oral lesions or sore throat Breast ROS: negative for - new or changing breast lumps Respiratory ROS: Chronic cough, and dyspnea on exertion no pleuritic pain,  Cardiovascular ROS: negative for - chest pain, dyspnea on exertion, edema, irregular heartbeat, murmur, orthopnea, palpitations, paroxysmal nocturnal dyspnea or rapid heart rate Gastrointestinal ROS: negative for - abdominal pain, appetite loss, blood in stools, change in bowel habits, constipation, diarrhea, heartburn, hematemesis, melena, nausea/vomiting or swallowing difficulty/pain Genito-Urinary ROS: negative for -, dysuria, hematuria, incontinence, nocturia or urinary frequency/urgency Musculoskeletal ROS: negative for - joint pain, joint stiffness, joint swelling, muscle pain, muscular weakness  Neurological ROS: negative for - behavioral changes, confusion, dizziness, gait disturbance, positive occasional headaches, impaired coordination/balance, memory loss, positive chronic distal neuropathy Dermatological ROS: negative for rash, ecchymosis Remaining ROS negative.  Physical Exam: Blood pressure 140/83, pulse 62, temperature 98.7 F (37.1 C), temperature source Oral, resp. rate 18, height 5\' 2"  (1.575 m), weight 153 lb 8 oz (69.627 kg). Wt Readings from Last 3 Encounters:  09/14/13 153 lb 8 oz (69.627 kg)  04/27/13 148 lb (67.132 kg)  04/14/13 147 lb 3.2 oz (66.769 kg)     General appearance: Well-nourished Caucasian woman HENNT: Pharynx no erythema, exudate, mass, or ulcer. No thyromegaly or thyroid nodules Lymph nodes: No cervical, supraclavicular, or axillary lymphadenopathy Breasts: Left mastectomy. No chest wall lesions. No right breast masses. Lungs: Clear to auscultation, resonant to  percussion throughout Heart: Regular rhythm, no murmur, no gallop, no rub, no click, no edema Abdomen: Soft, nontender,  normal bowel sounds, no mass, no organomegaly Extremities: No edema, no calf tenderness Musculoskeletal: no joint deformities GU:  Vascular: Carotid pulses 2+, no bruits, distal pulses: Dorsalis pedis 1+ symmetric Neurologic: Alert, oriented, PERRLA, optic discs sharp and vessels normal, no hemorrhage or exudate, cranial nerves grossly normal, motor strength 5 over 5, reflexes 1+ symmetric, upper body coordination normal, gait normal, Skin: No rash or ecchymosis  Lab Results: CBC W/Diff    Component Value Date/Time   WBC 7.2 09/09/2012 1437   WBC 2.8* 03/07/2011 1556   RBC 4.07 09/09/2012 1437   RBC 4.18 03/07/2011 1556   HGB 13.1 09/09/2012 1437   HGB 13.7 03/07/2011 1556   HCT 38.8 09/09/2012 1437   HCT 39.8 03/07/2011 1556   PLT 220 09/09/2012 1437   PLT 188.0 03/07/2011 1556   MCV 95.1 09/09/2012 1437   MCV 95.3 03/07/2011 1556   MCH 32.2 09/09/2012 1437   MCH 32.8 09/18/2010 1509   MCHC 33.9 09/09/2012 1437   MCHC 34.4 03/07/2011 1556   RDW 13.9 09/09/2012 1437   RDW 12.9 03/07/2011 1556   LYMPHSABS 1.4 09/09/2012 1437   LYMPHSABS 1.5 03/07/2011 1556   MONOABS 0.7 09/09/2012 1437   MONOABS 0.5 03/07/2011 1556   EOSABS 0.5 09/09/2012 1437   EOSABS 0.1 03/07/2011 1556   BASOSABS 0.1 09/09/2012 1437   BASOSABS 0.0 03/07/2011 1556     Chemistry      Component Value Date/Time   NA 141 09/14/2013 1618   NA 140 03/11/2012 1346   K 3.8 09/14/2013 1618   K 4.2 03/11/2012 1346   CL 102 03/10/2013 1529   CL 106 03/11/2012 1346   CO2 24 09/14/2013 1618   CO2 28 03/11/2012 1346   BUN 13.9 09/14/2013 1618   BUN 24* 03/11/2012 1346   CREATININE 0.8 09/14/2013 1618   CREATININE 0.80 03/11/2012 1346      Component Value Date/Time   CALCIUM 9.6 09/14/2013 1618   CALCIUM 9.3 09/14/2013 1618   ALKPHOS 76 09/14/2013 1618   ALKPHOS 70 03/11/2012 1346   AST 18 09/14/2013 1618   AST 18 03/11/2012 1346   ALT 12 09/14/2013 1618   ALT 13 03/11/2012 1346   BILITOT 0.89 09/14/2013 1618   BILITOT  0.7 03/11/2012 1346       Radiological Studies: No results found.  Impression:  #1. Stage I, ER positive breast cancer treated as outlined above.  No evidence for recurrence now out over 12 years from diagnosis in January 2002.   #2. Chronic [name] infection   #3. Chronic nodular pulmonary infiltrates secondary to #2. Most recent CT scan done 07/14/2013 stable compared to prior studies. Multiple small bilateral pulmonary nodules stable except for 2 new 4 mm nodules in the right upper lobe likely related to the same chronic inflammatory process.   #4. Progressive respiratory deterioration over time   #5. History of migraine headaches   #6. Low grade ovarian cancer status post hysterectomy plus of rectum in May 2004   #7 essential hypertension   #8. 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 Hypercalcemia. This may be a laboratory error. I am going to repeat a calcium with an ionized calcium  and parathyroid hormone.    CC: Patient Care Team: Pecola Lawless, MD as PCP - General Pecola Lawless, MD as Referring Physician (Internal Medicine)   Cephas Darby  M, MD 10/22/20141:48 PM Addendum: Repeat calcium, ionized calcium, and parathyroid hormone all normal.

## 2013-09-18 ENCOUNTER — Telehealth: Payer: Self-pay | Admitting: *Deleted

## 2013-09-18 DIAGNOSIS — Z853 Personal history of malignant neoplasm of breast: Secondary | ICD-10-CM

## 2013-09-18 NOTE — Telephone Encounter (Signed)
Spoke with patient.  Let her know that repeat calcium level and parathyroid hormone levels were normal and no further evaluation is needed.  Did let her know that schedulers will be calling her for a lab appt. In March to check her labs in order to do the CT scan.  She appreciated the phone call.

## 2013-09-18 NOTE — Telephone Encounter (Signed)
Message copied by Orbie Hurst on Fri Sep 18, 2013 11:27 AM ------      Message from: Levert Feinstein      Created: Tue Sep 15, 2013  7:31 PM       Call pt: repeat calcium level and parathyroid hormone levels normal.  No further eval needed ------

## 2014-01-06 ENCOUNTER — Other Ambulatory Visit: Payer: Self-pay | Admitting: Internal Medicine

## 2014-01-18 ENCOUNTER — Telehealth: Payer: Self-pay | Admitting: Internal Medicine

## 2014-01-18 MED ORDER — BUDESONIDE-FORMOTEROL FUMARATE 160-4.5 MCG/ACT IN AERO: 2.0000 | INHALATION_SPRAY | Freq: Two times a day (BID) | RESPIRATORY_TRACT | Status: DC

## 2014-01-18 NOTE — Telephone Encounter (Signed)
Just give one wtihout refill

## 2014-01-18 NOTE — Telephone Encounter (Signed)
Called spoke with patient who reported that she has approximately 4 doses left on her Symbicort; enough for 1 more day. Pt is scheduled to see MR on 2.27.15 -- that is this coming Friday.  Offered pt earlier appt with TP, but she is unable to come for earlier appt as she has "a pretty full week."  Dr Chase Caller, please advise if okay to refill pt's Symbicort 160.  Thank you.

## 2014-01-18 NOTE — Telephone Encounter (Signed)
Called spoke with pt. She has pending appt on 01/22/14. Last seen 10/21/12. She reports she does not have enough symbicort to get her to this visit. Wants a refill. Please advise MR thanks

## 2014-01-18 NOTE — Telephone Encounter (Signed)
When did she run out of symbicort? Why cannot she come and see me or TP this week?

## 2014-01-18 NOTE — Telephone Encounter (Signed)
Symbicort 160 rx sent to [facility].  Pt aware and is aware to keep appt on Friday for further refills.  She verbalized understanding and voiced no further questions or concerns at this time.

## 2014-01-22 ENCOUNTER — Ambulatory Visit: Payer: Medicare Other | Admitting: Internal Medicine

## 2014-01-25 ENCOUNTER — Other Ambulatory Visit: Payer: Self-pay | Admitting: Internal Medicine

## 2014-02-01 ENCOUNTER — Other Ambulatory Visit (HOSPITAL_BASED_OUTPATIENT_CLINIC_OR_DEPARTMENT_OTHER): Payer: Medicare Other

## 2014-02-01 ENCOUNTER — Ambulatory Visit (HOSPITAL_COMMUNITY): Payer: Medicare Other

## 2014-02-01 DIAGNOSIS — Z853 Personal history of malignant neoplasm of breast: Secondary | ICD-10-CM

## 2014-02-01 LAB — COMPREHENSIVE METABOLIC PANEL (CC13)
ALT: 14 U/L (ref 0–55)
ANION GAP: 8 meq/L (ref 3–11)
AST: 17 U/L (ref 5–34)
Albumin: 3.9 g/dL (ref 3.5–5.0)
Alkaline Phosphatase: 81 U/L (ref 40–150)
BUN: 23.4 mg/dL (ref 7.0–26.0)
CALCIUM: 10.2 mg/dL (ref 8.4–10.4)
CHLORIDE: 106 meq/L (ref 98–109)
CO2: 28 meq/L (ref 22–29)
Creatinine: 1 mg/dL (ref 0.6–1.1)
Glucose: 82 mg/dl (ref 70–140)
POTASSIUM: 4.4 meq/L (ref 3.5–5.1)
SODIUM: 142 meq/L (ref 136–145)
TOTAL PROTEIN: 7 g/dL (ref 6.4–8.3)
Total Bilirubin: 0.84 mg/dL (ref 0.20–1.20)

## 2014-02-04 ENCOUNTER — Ambulatory Visit
Admission: RE | Admit: 2014-02-04 | Discharge: 2014-02-04 | Disposition: A | Discharge status: Home / Self Care | Payer: Medicare Other | Source: Ambulatory Visit | Attending: Oncology | Admitting: Oncology

## 2014-02-04 ENCOUNTER — Telehealth: Payer: Self-pay | Admitting: *Deleted

## 2014-02-04 DIAGNOSIS — A319 Mycobacterial infection, unspecified: Secondary | ICD-10-CM

## 2014-02-04 NOTE — Telephone Encounter (Signed)
Message copied by Ignacia Felling on Thu Feb 04, 2014  4:37 PM ------      Message from: Annia Belt      Created: Thu Feb 04, 2014 11:30 AM       Call pt: CT with stable changes no new areas of concern ------

## 2014-02-04 NOTE — Telephone Encounter (Signed)
Left message on patient's answering maching:  CT with stable changes, no new areas of concern.

## 2014-02-12 ENCOUNTER — Encounter: Payer: Self-pay | Admitting: Internal Medicine

## 2014-02-12 ENCOUNTER — Ambulatory Visit (INDEPENDENT_AMBULATORY_CARE_PROVIDER_SITE_OTHER): Payer: Medicare Other | Admitting: Internal Medicine

## 2014-02-12 VITALS — BP 120/78 | HR 58 | Ht 63.0 in | Wt 152.0 lb

## 2014-02-12 DIAGNOSIS — J449 Chronic obstructive pulmonary disease, unspecified: Secondary | ICD-10-CM

## 2014-02-12 DIAGNOSIS — Z23 Encounter for immunization: Secondary | ICD-10-CM

## 2014-02-12 MED ORDER — BECLOMETHASONE DIPROPIONATE 80 MCG/ACT IN AERS: 2.0000 | INHALATION_SPRAY | Freq: Two times a day (BID) | RESPIRATORY_TRACT | Status: DC

## 2014-02-12 NOTE — Assessment & Plan Note (Signed)
Your disease is stable Understand your are concerned about risks of Inhaled steroids on bone  - stop symbicort  -start QVAR 80mcg, 2 puff twice daily Have PREVNAR vaccine 02/12/2014   Fu  6 months to report progress   

## 2014-02-12 NOTE — Patient Instructions (Addendum)
Your disease is stable Understand your are concerned about risks of Inhaled steroids on bone  - stop symbicort  -start QVAR 16mcg, 2 puff twice daily Have PREVNAR vaccine 02/12/2014   Fu  6 months to report progress

## 2014-02-12 NOTE — Progress Notes (Signed)
Subjective:    Patient ID: Crystal Bass, female    DOB: 27-Jul-1941, 73 y.o.   MRN: [id number]  HPI  66 yowf never smoker with h/o [name] referred to Pulmonary clinic by Dr Linna Darner for chronic cough. CXR 4/13 COPD/emphysema  Stable postoperative changes involving the right lung. CT chest 03/12/11 post op changes on R, scattered perbronch nodules and mild bronchiectasis  03/13/2011 Initial pulmonary office eval in EMR era main c/o chronic cough (x years) and sob and worse x 3 weeks abruptly sob  Assoc with cough with green mucus which is a lot better p rx with augmentin  but breathing not better to her satisfaction since starting topamax with variable doe and resting sob and  having trouble talking.  No overt hb. No better on advair.  > rec stop advair, start high dose ppi/ pepcid hs and diet  03/28/2011 ov / Wert some better cough  But still sob with housework and walk at [employer], does not use [facility] parking. rec Continue Prilosec 20 mg Take 30-60 min before first meal of the day and Pepcid 20 mg one at bedtime  GERD (REFLUX) diet  04/17/2011 ov/Wert  Cc doe x years walking x 30 min x moderate pace. Cough resolved "because I stopped the topamax" Sleeping ok without nocturnal  or early am exac of resp c/o's. >>d/c ppi   08/15/2012 Acute OV  Complains of prod cough with yellow mucus, increased SOB x2 weeks.  Was given augmentin by PCP yesterday. Has not started this yet .  Not seen for > 1 year , doing okay w/out flares   no otc used.  No fever or hemoptysis . No chest pain  No recent travel or abx uses  >Take Augmentin as directed  Mucinex DM Twice daily As needed Cough/congestion  Fluids and rest  follow up 4 weeks and As needed  I will call with xray results.  Please contact office for sooner follow up if symptoms do not improve or worsen or seek emergency care   OV 09/17/2012  - switched to Dr MR Dyspnea: insidious onset. Progressive/Stable since onset several years ago. Rates  dyspnea as moderate. Activities such as walking < few hundred feet or < 1/4 mile makes her dyspneic. Humidity, pollens makes dyspnea worse.  Improved by sitting and resting. No associated chest pains. She feels she is deconditioned and is in a catch 22 situation.    Cough: is independent of dyspnea. Cough present for several years. Constant (not on and off). Worsened by infectious bronchitis episodes. Never had remission beyond 4 weeks with cough. Cough is moderate in intensity at baseline and with infectious episodes can get severe. She finds this embarrassing esp because she has to visit her ailing m-i-l in a nursing home. Quality is wet; brings out pea-green sputum during bronchitis but currently jelly like yellow which is baseline. OF note, she took 2 x 6-9 months of [name] Rx in 2002 and 2003 but she is not sure if that helped cough.    - Sinus: denies problems other than anosmia. Denies chronic rhino sinusitis symptoms Never seen ENT  - Allergies - sees Dr Donneta Romberg and has weekly allergy shots against mildew, [place] x 40 years. Symptoms are well controled but are typically sneezing, runny nose, eyes. Says if she does not take allergy shots - allergies return with a "vengeance"   - GERD: denies. Per hx prior trial of ppi did not help  - BP: takes losartan and  lopressor  - Pulmonary - never smoker. Born with asthma. Thinks she has copd now. Does not take any mdi because she felt she could manage without it. However, Dr Donneta Romberg has recommended mdi and she believes she is close to making a decision on this.  PFTs may 2012: Mild obstruction with non-significant BD response. Normal DLCO. Carries prior diagnosis of bronchiectasis and mom died of it. .Denies diagnostic workup for bronchiectasis and does not want workup. CT oct 2013: Areas of bronchiectasis and scarring in the lingula and anterior right middle lobe. Pulmonary nodule: RLL Oct 2013: stable since oct 2012  10/01/2012 Acute OV  Complains of  increased SOB, wheezing, prod cough with cream/gray/pea-green mucus, fever up to 100 x6 days - reports was cleaning mildew outside the day before.  was given levaquin and pred taper by MR 11/4 via phone msg No hemoptyiss , chest pain or edema.  Mistakenly took dog's allergy med this am. Poison control and PCP/Vet said she should be fine.  No otc used .    REC CMA/RN will walk you for oxygen levels right now  Let us try pulmicort 113mcg, 2puff twice daily - take few samples and learn technique. Will send in script as well  REturn in 4-6 weeks with pft testing  Cough score at followup  OV 10/21/2012   Fu dyspnea and cough after starting pulmicort. She feels both have improved around 75%. "Yucky" yellow sputum is resolved. She is no longer embarrassed about visiting her mother in law in SNF. Current RSI cough score is 9 and reflects improvement post pulmicort; we do not have baseline value. Kouffman cough differentiator score is 0 for GERD and 1 for airway/neurogenic. PFT 10/21/2012 below: shows miild-moderate obstriuction with sig BD responmse and normal DLCO (like an asthmatic). She is hesitant to continue ICS due to risk of accelerated bone loss but after long discussion agreed. She agrees to add LABA Rx and therefore will  switch from pulmicort to symbicort     PFTs 10/21/2012   PFT FVC fev1 ratio BD fev1 TLC DLCO comments  10/21/2012  post pulmicort 2.L/78% 1.35L/69% 64/down% 12% improvement in FEv1 to 1.5L 3.9L/85% 14.8/82% Mild Obstruction with posiitive BD response; Normal DLco - c/w asthmatic response     REC You have asthma or your bronchiectasis making you behave like an asthmatic PFT still shows obstruction at moderate severity despite pulmicort So stop pulmicort Please start symbicort 80/4.5 2 puff twice daily - take sample, script and show technique Use albuterol as needed Have pneumovax 10/21/2012 Glad you had flu shot Return in 3 months or sooner if needed   OV  02/12/2014  Chief Complaint  Patient presents with  . Asthma    pt states she has SOB with activity, and an occasional produtive cough with green phlegm.     Follow dyspnea and cough in the setting of obstructive lung disease related to asthma/bronchiectasis  I'm not seen in nearly a year and a half. She continues to do well. No interim problems. Cough and dyspnea minimal. No interim hospitalizations, urgent care visits, emergency room visits or new medical diagnoses. No changes in her social situation other than the fact that her mother-in-law passed away. She wants to cut down her inhale corticosteroid use because of ongoing osteoporosis and concerns of that getting worse. She is interested in Prevnar  Dr Lorenza Cambridge Reflux Symptom Index (> 13-15 suggestive of LPR cough) 10/21/2012 Post pulmicort  Hoarseness of problem with voice 3  Clearing  Of  Throat 2  Excess throat mucus or feeling of post nasal drip 0  Difficulty swallowing food, liquid or tablets 0  Cough after eating or lying down 0  Breathing difficulties or choking episodes 4  Troublesome or annoying cough 3  Sensation of something sticking in throat or lump in throat 0  Heartburn, chest pain, indigestion, or stomach acid coming up 0  TOTAL 12     Review of Systems  Constitutional: Negative for fever and unexpected weight change.  HENT: Negative for congestion, dental problem, ear pain, nosebleeds, postnasal drip, rhinorrhea, sinus pressure, sneezing, sore throat and trouble swallowing.   Eyes: Negative for redness and itching.  Respiratory: Positive for cough and shortness of breath. Negative for chest tightness and wheezing.   Cardiovascular: Negative for palpitations and leg swelling.  Gastrointestinal: Negative for nausea and vomiting.  Genitourinary: Negative for dysuria.  Musculoskeletal: Negative for joint swelling.  Skin: Negative for rash.  Neurological: Negative for headaches.  Hematological: Does not  bruise/bleed easily.  Psychiatric/Behavioral: Negative for dysphoric mood. The patient is not nervous/anxious.        Objective:   Physical Exam    GEN: A/Ox3; pleasant , NAD, well nourished   HEENT:  [place]/AT,  EACs-clear, TMs-wnl, NOSE-clear, THROAT-clear, no lesions, no postnasal drip or exudate noted.   NECK:  Supple w/ fair ROM; no JVD; normal carotid impulses w/o bruits; no thyromegaly or nodules palpated; no lymphadenopathy.  RESP  Coarse BS w/ few scattered crackles, no accessory muscle use, no dullness to percussion  CARD:  RRR, no m/r/g  , no peripheral edema, pulses intact, no cyanosis or clubbing.  GI:   Soft & nt; nml bowel sounds; no organomegaly or masses detected.  Musco: Warm bil, no deformities or joint swelling noted.   Neuro: alert, no focal deficits noted.    Skin: Warm, no lesions or rashes            Assessment & Plan:

## 2014-02-15 ENCOUNTER — Ambulatory Visit (HOSPITAL_BASED_OUTPATIENT_CLINIC_OR_DEPARTMENT_OTHER): Payer: Medicare Other | Admitting: Oncology

## 2014-02-15 VITALS — BP 115/71 | HR 76 | Temp 97.0°F | Resp 18 | Ht 63.0 in | Wt 151.7 lb

## 2014-02-15 DIAGNOSIS — R0609 Other forms of dyspnea: Secondary | ICD-10-CM

## 2014-02-15 DIAGNOSIS — G8918 Other acute postprocedural pain: Secondary | ICD-10-CM

## 2014-02-15 DIAGNOSIS — R918 Other nonspecific abnormal finding of lung field: Secondary | ICD-10-CM

## 2014-02-15 DIAGNOSIS — Z170 Estrogen receptor positive status [ER+]: Principal | ICD-10-CM

## 2014-02-15 DIAGNOSIS — C50919 Malignant neoplasm of unspecified site of unspecified female breast: Secondary | ICD-10-CM

## 2014-02-15 DIAGNOSIS — I10 Essential (primary) hypertension: Secondary | ICD-10-CM

## 2014-02-15 DIAGNOSIS — R05 Cough: Secondary | ICD-10-CM

## 2014-02-15 DIAGNOSIS — Z853 Personal history of malignant neoplasm of breast: Secondary | ICD-10-CM

## 2014-02-15 DIAGNOSIS — A319 Mycobacterial infection, unspecified: Secondary | ICD-10-CM

## 2014-02-15 DIAGNOSIS — R059 Cough, unspecified: Secondary | ICD-10-CM

## 2014-02-15 DIAGNOSIS — R0989 Other specified symptoms and signs involving the circulatory and respiratory systems: Secondary | ICD-10-CM

## 2014-02-15 NOTE — Progress Notes (Signed)
Hematology and Oncology Follow Up Visit  Crystal Bass [id number] September 14, 1941 73 y.o. 02/15/2014 6:29 PM   Principle Diagnosis: Encounter Diagnoses  Name Primary?  . Ductal carcinoma of breast, estrogen receptor positive, stage 1 Yes  . ATYPICAL MYCOBACTERIAL INFECTION      Interim History:   Followup visit for this 73 year old woman with a history of stage I ER positive cancer of the left breast diagnosed in January 2002. She was treated with a left modified radical mastectomy then initial tamoxifen hormonal therapy and then transitioned to an aromatase inhibitor which she took through 2012.[name] She had a single small pulmonary lesion that was positive on a PET scan at time of diagnosis.. It was surgically resected and found to be an area of Mycobacterium avium tuberculosis. CT scans of the chest show multiple bilateral areas of nodular pulmonary infiltrates which have fluctuated over time but have been overall stable and then any signs of malignant change. She has been treated on at least 2 or 3 occasions in the past for [name]. She has a chronic cough, and I believe that she has developed bronchiectasis. She is not immunocompromised as far as I can tell. She is followed by pulmonary medicine Dr. Chase Caller and just had a followup visit last week. She has had no interim medical problems. She got through the winter without any serious infections. No change in her chronic cough or dyspnea with mild exertion. She has not required home oxygen.  CT scan of the chest done in anticipation of today's visit and I personally reviewed the images with her. No change in chronic bilateral nodular pulmonary infiltrates.  Medications: reviewed  Allergies:  Allergies  Allergen Reactions  . Sulfonamide Derivatives     REACTION: anaphylaxis  . Hydrocodone Other (See Comments)    "hyper and climbing the walls"  . Biaxin [Clarithromycin]   . Motrin [Ibuprofen]     headaches  . Topamax [Topiramate]    Metabolic acidosis   . Percocet [Oxycodone-Acetaminophen] Itching and Rash  . Tape Other (See Comments)    REDNESS  . Tetracyclines & Related     "immediate yeast infection"    Review of Systems: Hematology:  No bleeding or bruising ENT ROS: No sore throat Breast ROS: No right breast lumps, chronic phantom pain left axilla. Respiratory ROS: See above Cardiovascular ROS:  No ischemic type chest pain or palpitations Gastrointestinal ROS:  No abdominal pain or change in bowel habit  Genito-Urinary ROS: Not questioned Musculoskeletal ROS: No muscle or bone pain Neurological ROS: No recent flareups of chronic migraine headache or change in vision Dermatological ROS: No rash or ecchymosis Remaining ROS negative:   Physical Exam: Blood pressure 115/71, pulse 76, temperature 97 F (36.1 C), temperature source Oral, resp. rate 18, height 5\' 3"  (1.6 m), weight 151 lb 11.2 oz (68.811 kg), SpO2 97.00%. Wt Readings from Last 3 Encounters:  02/15/14 151 lb 11.2 oz (68.811 kg)  02/12/14 152 lb (68.947 kg)  09/14/13 153 lb 8 oz (69.627 kg)     General appearance: Well-nourished Caucasian woman HENNT: Pharynx no erythema, exudate, mass, or ulcer. No thyromegaly or thyroid nodules Lymph nodes: No cervical, supraclavicular, or axillary lymphadenopathy Breasts: Left mastectomy. No chest wall lesions. No right breast masses. Lungs: Clear to auscultation, resonant to percussion throughout Heart: Regular rhythm, no murmur, no gallop, no rub, no click, no edema Abdomen: Soft, nontender, normal bowel sounds, no mass, no organomegaly Extremities: No edema, no calf tenderness Musculoskeletal: no joint deformities GU:  Vascular:  Carotid pulses 2+, no bruits, Neurologic: Alert, oriented, PERRLA,, cranial nerves grossly normal, motor strength 5 over 5, reflexes 1+ symmetric, upper body coordination normal, gait normal, Skin: No rash or ecchymosis  Lab Results: CBC W/Diff    Component Value Date/Time    WBC 7.2 09/09/2012 1437   WBC 2.8* 03/07/2011 1556   RBC 4.07 09/09/2012 1437   RBC 4.18 03/07/2011 1556   HGB 13.1 09/09/2012 1437   HGB 13.7 03/07/2011 1556   HCT 38.8 09/09/2012 1437   HCT 39.8 03/07/2011 1556   PLT 220 09/09/2012 1437   PLT 188.0 03/07/2011 1556   MCV 95.1 09/09/2012 1437   MCV 95.3 03/07/2011 1556   MCH 32.2 09/09/2012 1437   MCH 32.8 09/18/2010 1509   MCHC 33.9 09/09/2012 1437   MCHC 34.4 03/07/2011 1556   RDW 13.9 09/09/2012 1437   RDW 12.9 03/07/2011 1556   LYMPHSABS 1.4 09/09/2012 1437   LYMPHSABS 1.5 03/07/2011 1556   MONOABS 0.7 09/09/2012 1437   MONOABS 0.5 03/07/2011 1556   EOSABS 0.5 09/09/2012 1437   EOSABS 0.1 03/07/2011 1556   BASOSABS 0.1 09/09/2012 1437   BASOSABS 0.0 03/07/2011 1556     Chemistry      Component Value Date/Time   NA 142 02/01/2014 1411   NA 140 03/11/2012 1346   K 4.4 02/01/2014 1411   K 4.2 03/11/2012 1346   CL 102 03/10/2013 1529   CL 106 03/11/2012 1346   CO2 28 02/01/2014 1411   CO2 28 03/11/2012 1346   BUN 23.4 02/01/2014 1411   BUN 24* 03/11/2012 1346   CREATININE 1.0 02/01/2014 1411   CREATININE 0.80 03/11/2012 1346      Component Value Date/Time   CALCIUM 10.2 02/01/2014 1411   CALCIUM 9.3 09/14/2013 1618   ALKPHOS 81 02/01/2014 1411   ALKPHOS 70 03/11/2012 1346   AST 17 02/01/2014 1411   AST 18 03/11/2012 1346   ALT 14 02/01/2014 1411   ALT 13 03/11/2012 1346   BILITOT 0.84 02/01/2014 1411   BILITOT 0.7 03/11/2012 1346       Radiological Studies: Ct Chest W Contrast  02/04/2014   CLINICAL DATA:  Followup of chronic [name] infection. Chronic shortness of breath. Upper chest pain for 2 weeks. Left mastectomy and right lung biopsy in 2001. Ovarian cancer in 2004, treated with surgery.  EXAM: CT CHEST WITH CONTRAST  TECHNIQUE: Multidetector CT imaging of the chest was performed during intravenous contrast administration.  CONTRAST:  75 cc Omnipaque 300  COMPARISON:  CT CHEST W/CM dated 07/14/2013; CT CHEST W/CM dated 09/24/2011  FINDINGS:  Lungs/Pleura: Atypical right medial upper lung morphology could relate to scarring or accessory fissure/lobe. This is unchanged.  Overall similar distribution of scattered tiny pulmonary nodules and areas of reticular nodular opacity. Some nodules are calcified. The posterior right lung tandem nodules described on the prior exam are not identified. There is clustered nodularity in this area, including on image 23/series 4. Spiculated dominant nodule in the superior segment right lower lobe measures 1.2 x 0.7 cm on image 35 and is similar 1.3 x 0.9 cm on the prior exam.  No areas of consolidation or superimposed acute process. There is a tiny left lower lobe nodule which is not readily apparent on the prior exam. 2 mm on image 28/series 4 today.  No pleural fluid.  Heart/Mediastinum: No axillary adenopathy. Left axillary node dissection and mastectomy. Ascending aorta tortuous and upper normal in size, 3.7 cm. Mild cardiomegaly with left atrial enlargement.  No central pulmonary embolism, on this non-dedicated study. No mediastinal or definite hilar adenopathy. No internal mammary adenopathy.  Upper Abdomen: Hyperenhancing focus in the right lobe of the liver which is unchanged on image 66. 1.1 cm. Present back to 09/24/11, most consistent with a benign etiology. Normal adrenal glands.  Bones/Musculoskeletal:  No acute osseous abnormality.  IMPRESSION: 1. Overall similar distribution of scarring and reticular nodular opacity, likely due to chronic atypical infection. 2. No evidence of acute superimposed process or suspicious dominant pulmonary nodule. 3. Left mastectomy and axillary node dissection, without evidence of metastatic disease. 4. Similar hyperenhancing focus in the right lobe of the liver, likely a hemangioma.   Electronically Signed   By: Abigail Miyamoto M.D.   On: 02/04/2014 11:19    Impression:  #1. Stage I, ER positive breast cancer treated as outlined above.  No evidence for recurrence now out over  13 years from diagnosis in January 2002.  I gave her the option of graduating from our practice but she would like to be seen on an annual basis. I'm going to transition her care to Dr. Humphrey Rolls.  #2. Chronic [name] infection  She has not required any active antibiotic treatment for this for many years.  #3. Chronic nodular pulmonary infiltrates secondary to #2.  Most recent CT scan done 02/04/2014 stable compared to prior studies. Multiple small bilateral pulmonary nodules stable.    #4. Progressive respiratory deterioration over time  Likely bronchiectasis from chronic cough and ongoing inflammatory changes in the lung. . #5. History of migraine headaches   #6. Low grade ovarian cancer status post hysterectomy plus oophorectomy in May 2004   #7 essential hypertension   #8. 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 Borderline Hypercalcemia. High normal parathyroid hormone obtained 6 months ago.   CC: Patient Care Team: Hendricks Limes, MD as PCP - General Hendricks Limes, MD as Referring Physician (Internal Medicine) Annia Belt, MD as Consulting Physician (Oncology) Michel Bickers, MD as Consulting Physician (Infectious Diseases)   Annia Belt, MD 3/23/20156:29 PM

## 2014-02-16 ENCOUNTER — Telehealth: Payer: Self-pay | Admitting: Oncology

## 2014-02-16 NOTE — Telephone Encounter (Signed)
s.w. pt and advised on Sept appt pt requested appts to be mailed done

## 2014-02-19 ENCOUNTER — Other Ambulatory Visit: Payer: Self-pay | Admitting: Oncology

## 2014-02-19 DIAGNOSIS — Z853 Personal history of malignant neoplasm of breast: Secondary | ICD-10-CM

## 2014-02-19 DIAGNOSIS — Z9012 Acquired absence of left breast and nipple: Secondary | ICD-10-CM

## 2014-02-22 ENCOUNTER — Ambulatory Visit: Payer: Medicare Other

## 2014-02-22 ENCOUNTER — Ambulatory Visit
Admission: RE | Admit: 2014-02-22 | Discharge: 2014-02-22 | Disposition: A | Discharge status: Home / Self Care | Payer: Self-pay | Source: Ambulatory Visit | Attending: Oncology | Admitting: Oncology

## 2014-02-22 DIAGNOSIS — Z853 Personal history of malignant neoplasm of breast: Secondary | ICD-10-CM

## 2014-02-22 DIAGNOSIS — Z9012 Acquired absence of left breast and nipple: Secondary | ICD-10-CM

## 2014-03-02 ENCOUNTER — Encounter: Payer: Self-pay | Admitting: Internal Medicine

## 2014-03-02 ENCOUNTER — Ambulatory Visit (INDEPENDENT_AMBULATORY_CARE_PROVIDER_SITE_OTHER): Payer: Medicare Other | Admitting: Internal Medicine

## 2014-03-02 VITALS — BP 127/86 | HR 83 | Temp 97.9°F | Wt 152.0 lb

## 2014-03-02 DIAGNOSIS — L0390 Cellulitis, unspecified: Secondary | ICD-10-CM

## 2014-03-02 DIAGNOSIS — L0291 Cutaneous abscess, unspecified: Secondary | ICD-10-CM

## 2014-03-02 MED ORDER — AMOXICILLIN-POT CLAVULANATE 875-125 MG PO TABS: 1.0000 | ORAL_TABLET | Freq: Two times a day (BID) | ORAL | Status: DC

## 2014-03-02 NOTE — Patient Instructions (Signed)
Take the oral antibiotics as prescribed (augmentin) Put  over-the-counter antibiotic ointment twice a day call if pain is not improving in the next few days.

## 2014-03-02 NOTE — Progress Notes (Signed)
Subjective:    Patient ID: Crystal Bass, female    DOB: 1941/07/10, 73 y.o.   MRN: [id number]  DOS:  03/02/2014 Type of  visit: Acute visit  On 02/18/2014 found tick near her right buttock, area was itchy and red. 3 days ago they area started to increase in size.   ROS Denies fever chills. Aches and pains, fatigue slight interval baseline. + headaches x the last 2 days, no worse of her life. Has a history of HAs  Past Medical History  Diagnosis Date  . Pancreatitis     secondary to Cholelithiasis  . Breast cancer 2002    infiltrative ductal carcinoma   . Lesion of breast 1992    right, benign  . Post-thoracotomy pain syndrome   . Uterine fibroid   . Endometriosis   . Ovarian cancer 2004  . 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 Osteoporosis, post-menopausal 03/14/2012  . Ductal carcinoma of breast, estrogen receptor positive, stage 1 09/16/2012  . Pseudomonas pneumonia 5/09  . COPD (chronic obstructive pulmonary disease) 9/08  . Mastalgia 7/95  . Lung disease     secondary to [name] infection  . Hypercalcemia 09/14/2013    Past Surgical History  Procedure Laterality Date  . Lung biopsy  2002    [name], Dr [name]  . Cardiac catheterization  2007    essentially negation for significant CAD  . Appendectomy  2004  . Cholecystectomy  2001  . 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 Breast lumpectomy  1992    benign  . Mastectomy modified radical  2002    oral chemotheraphy (tamoxifen then Armidex) no radiation, Dr.[name]  . Tonsillectomy  1946  . Breast biopsy  08/2004    right breast-benign  . Colonoscopy  2014    Dr [name] , due 2019    History   Social History  . Marital Status: Widowed    Spouse Name: N/A    Number of Children: 0  . Years of Education: N/A   Occupational History  . 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  Smoking status: Never Smoker   . Smokeless tobacco: Never Used  . Alcohol Use: No  . Drug Use: No  . Sexual Activity: No   Other Topics Concern  . Not on file   Social History Narrative   Caregiver for >98 yo mother in law living with pt        Medication List       This list is accurate as of: 03/02/14  6:29 PM.  Always use your most recent med list.               acetaminophen 325 MG tablet  Commonly known as:  TYLENOL  Take 1,000 mg by mouth 2 (two) times daily as needed.     albuterol 108 (90 BASE) MCG/ACT inhaler  Commonly known as:  PROAIR HFA  Inhale 2 puffs into the lungs every 6 (six) hours as needed.     amoxicillin-clavulanate 875-125 MG per tablet  Commonly known as:  AUGMENTIN  Take 1 tablet by mouth 2 (two) times daily.     aspirin 81 MG tablet  Take 81 mg by mouth daily.     beclomethasone 80 MCG/ACT inhaler  Commonly known as:  QVAR  Inhale 2 puffs into  the lungs 2 (two) times daily.     benzonatate 200 MG capsule  Commonly known as:  TESSALON  Take 1 capsule (200 mg total) by mouth 3 (three) times daily as needed for cough.     budesonide-formoterol 160-4.5 MCG/ACT inhaler  Commonly known as:  SYMBICORT  Inhale 2 puffs into the lungs 2 (two) times daily.     CALCIUM 600 + MINERALS 600-200 MG-UNIT Tabs  Take 1 tablet by mouth 2 (two) times daily.     fexofenadine 180 MG tablet  Commonly known as:  ALLEGRA  Take 180 mg by mouth daily as needed.     gabapentin 300 MG capsule  Commonly known as:  [name]  TAKE 1 TO 3 CAPSULES BY MOUTH EVERY 8 HOURS AS NEEDED     IMITREX 100 MG tablet  Generic drug:  SUMAtriptan  Take 100 mg by mouth every 2 (two) hours as needed.     loratadine 10 MG tablet  Commonly known as:  CLARITIN  Take 10 mg by mouth as needed for allergies.     losartan 50 MG tablet  Commonly known as:  COZAAR  TAKE 1 TABLET EVERY DAY     metoprolol tartrate 25 MG tablet  Commonly known as:  LOPRESSOR  1 by mouth twice a  day     multivitamin capsule  Take 1 capsule by mouth daily.     Vitamin D (Ergocalciferol) 50000 UNITS Caps capsule  Commonly known as:  [name]  TAKE 1 CAPLET BY MOUTH EVERY WEEK     ZOMETA 4 MG/5ML injection  Generic drug:  zolendronic acid  Inject 4 mg into the vein every 6 (six) months.           Objective:   Physical Exam  Skin:      BP 127/86  Pulse 83  Temp(Src) 97.9 F (36.6 C)  Wt 152 lb (68.947 kg)  SpO2 95%  General -- alert, well-developed, NAD.   Lungs -- normal respiratory effort, no intercostal retractions, no accessory muscle use, and decreased  breath sounds.  Heart-- normal rate, regular rhythm, no murmur.   Psych-- Cognition and judgment appear intact. Cooperative with normal attention span and concentration. No anxious or depressed appearing.       Assessment & Plan:  Superficial boil, small cellulitis doubt rash related to a tick bite rater she probably has a bacterial infection. Plan: Augmentin for a few days. She is allergic to sulfa, Biaxin and tetracycline

## 2014-03-02 NOTE — Progress Notes (Signed)
Pre visit review using our clinic review tool, if applicable. No additional management support is needed unless otherwise documented below in the visit note. 

## 2014-03-15 ENCOUNTER — Ambulatory Visit (HOSPITAL_BASED_OUTPATIENT_CLINIC_OR_DEPARTMENT_OTHER): Payer: Medicare Other

## 2014-03-15 ENCOUNTER — Other Ambulatory Visit: Payer: Self-pay | Admitting: *Deleted

## 2014-03-15 VITALS — BP 138/83 | HR 60 | Temp 97.7°F

## 2014-03-15 DIAGNOSIS — M810 Age-related osteoporosis without current pathological fracture: Secondary | ICD-10-CM

## 2014-03-15 MED ORDER — ZOLEDRONIC ACID 4 MG/100ML IV SOLN
4.0000 mg | Freq: Once | INTRAVENOUS | Status: AC
  Administered 2014-03-15: 4 mg via INTRAVENOUS
  Filled 2014-03-15: qty 100

## 2014-03-15 MED ORDER — SODIUM CHLORIDE 0.9 % IV SOLN
Freq: Once | INTRAVENOUS | Status: AC
  Administered 2014-03-15: 10:00:00 via INTRAVENOUS

## 2014-03-15 NOTE — Patient Instructions (Signed)

## 2014-03-16 ENCOUNTER — Other Ambulatory Visit: Payer: Self-pay

## 2014-03-16 MED ORDER — GABAPENTIN 300 MG PO CAPS: ORAL_CAPSULE | ORAL | Status: DC

## 2014-04-26 ENCOUNTER — Other Ambulatory Visit: Payer: Self-pay

## 2014-04-26 DIAGNOSIS — I10 Essential (primary) hypertension: Secondary | ICD-10-CM

## 2014-04-26 MED ORDER — METOPROLOL TARTRATE 25 MG PO TABS: ORAL_TABLET | ORAL | Status: DC

## 2014-04-26 MED ORDER — LOSARTAN POTASSIUM 50 MG PO TABS: ORAL_TABLET | ORAL | Status: DC

## 2014-05-04 ENCOUNTER — Encounter: Payer: Self-pay | Admitting: Obstetrics & Gynecology

## 2014-05-06 ENCOUNTER — Encounter: Payer: Self-pay | Admitting: Obstetrics & Gynecology

## 2014-05-06 ENCOUNTER — Ambulatory Visit (INDEPENDENT_AMBULATORY_CARE_PROVIDER_SITE_OTHER): Payer: Medicare Other | Admitting: Obstetrics & Gynecology

## 2014-05-06 VITALS — BP 130/72 | HR 82 | Temp 97.7°F | Ht 62.5 in | Wt 154.0 lb

## 2014-05-06 DIAGNOSIS — R82998 Other abnormal findings in urine: Secondary | ICD-10-CM

## 2014-05-06 DIAGNOSIS — Z01419 Encounter for gynecological examination (general) (routine) without abnormal findings: Secondary | ICD-10-CM

## 2014-05-06 DIAGNOSIS — C569 Malignant neoplasm of unspecified ovary: Secondary | ICD-10-CM

## 2014-05-06 DIAGNOSIS — Z0000 Encounter for general adult medical examination without abnormal findings: Secondary | ICD-10-CM

## 2014-05-06 LAB — POCT URINALYSIS DIPSTICK
Bilirubin, UA: NEGATIVE
Blood, UA: 50
Glucose, UA: NEGATIVE
KETONES UA: NEGATIVE
NITRITE UA: NEGATIVE
PH UA: 5
PROTEIN UA: NEGATIVE
Urobilinogen, UA: NEGATIVE

## 2014-05-06 NOTE — Progress Notes (Signed)
Patient ID: Crystal Bass, female   DOB: 22-Jun-1941, 73 y.o.   MRN: [id number]  73 y.o. G0P0000 WidowedCaucasianF here for annual exam.  No vaginal bleeding.  Reports she hasn't met her "new" oncologist yet.  Saw Dr. Beryle Beams for years.  Also, Dr. Linna Darner is gone.  Has seen Dr. Larose Kells.  Reports had a tick bite and still having some irritation around the same place.  No LMP recorded. Patient has had a hysterectomy.          Sexually active: no  The current method of family planning is post menopausal status.    Exercising: no  The patient does not participate in regular exercise at present. Smoker:  no  Health Maintenance: Pap:  Nov  2011 History of abnormal Pap:  no MMG:  April 2015, Normal Colonoscopy: 4/14, Dr. [name] BMD:   2014, -2.6/-2.1 TDaP:  2012 Screening Labs: none today, urine: +1 RBC, +2WBC, +sediment   reports that she has never smoked. She has never used smokeless tobacco. She reports that she does not drink alcohol or use illicit drugs.  Past Medical History  Diagnosis Date  . Pancreatitis     secondary to Cholelithiasis  . Breast cancer 2002    infiltrative ductal carcinoma   . Lesion of breast 1992    right, benign  . Post-thoracotomy pain syndrome   . Uterine fibroid   . Endometriosis   . Ovarian cancer 2004  . 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 Osteoporosis, post-menopausal 03/14/2012  . Ductal carcinoma of breast, estrogen receptor positive, stage 1 09/16/2012  . Pseudomonas pneumonia 5/09  . COPD (chronic obstructive pulmonary disease) 9/08  . Mastalgia 7/95  . Lung disease     secondary to [name] infection  . Hypercalcemia 09/14/2013  . Vitamin D deficiency   . Aortic root aneurysm     Past Surgical History  Procedure Laterality Date  . Lung biopsy  2002    [name], Dr [name]  . Cardiac catheterization  2007    essentially negation for significant CAD  . Appendectomy  2004  . Cholecystectomy  2001  . Abdominal hysterectomy      &  BSO for Mucinous borderline tumor of R ovary 2004  . Breast lumpectomy  1992    benign  . Mastectomy modified radical  2002    oral chemotheraphy (tamoxifen then Armidex) no radiation, Dr.[name]  . Tonsillectomy  1946  . Breast biopsy  08/2004    right breast-benign  . Colonoscopy  2014    Dr [name] , due 2019    Current Outpatient Prescriptions  Medication Sig Dispense Refill  . acetaminophen (TYLENOL) 325 MG tablet Take 1,000 mg by mouth 2 (two) times daily as needed.       [name] aspirin 81 MG tablet Take 81 mg by mouth daily.        . benzonatate (TESSALON) 200 MG capsule Take 1 capsule (200 mg total) by mouth 3 (three) times daily as needed for cough.  30 capsule  1  . budesonide-formoterol (SYMBICORT) 160-4.5 MCG/ACT inhaler Inhale 2 puffs into the lungs 2 (two) times daily.  1 Inhaler  0  . Calcium Carbonate-Vit D-Min (CALCIUM 600 + MINERALS) 600-200 MG-UNIT TABS Take 1 tablet by mouth 2 (two) times daily.      [name] gabapentin ([name]) 300 MG capsule TAKE 1 TO 3 CAPSULES BY MOUTH EVERY 8 HOURS AS NEEDED  270 capsule  0  .  IMITREX 100 MG tablet Take 100 mg by mouth every 2 (two) hours as needed.       [name] losartan (COZAAR) 50 MG tablet TAKE 1 TABLET EVERY DAY  90 tablet  0  . metoprolol tartrate (LOPRESSOR) 25 MG tablet 1 by mouth twice a day  180 tablet  0  . Multiple Vitamin (MULTIVITAMIN) capsule Take 1 capsule by mouth daily.      . Vitamin D, Ergocalciferol, ([name]) 50000 UNITS CAPS TAKE 1 CAPLET BY MOUTH EVERY WEEK  26 capsule  4  . zolendronic acid (ZOMETA) 4 MG/5ML injection Inject 4 mg into the vein every 6 (six) months.        [name] albuterol (PROAIR HFA) 108 (90 BASE) MCG/ACT inhaler Inhale 2 puffs into the lungs every 6 (six) hours as needed.  1 Inhaler  3  . fexofenadine (ALLEGRA) 180 MG tablet Take 180 mg by mouth daily as needed.       . loratadine (CLARITIN) 10 MG tablet Take 10 mg by mouth as needed for allergies.       No current facility-administered medications for  this visit.    Family History  Problem Relation Age of Onset  . Lung cancer Father   . Breast cancer Sister 62  . Colon cancer Sister   . Stroke Paternal Aunt     in mid 45s  . Osteoporosis Paternal Aunt   . Myasthenia gravis Paternal Aunt   . Emphysema Mother     was never a smoker  . Diabetes Neg Hx   . Heart disease Neg Hx     ROS:  Pertinent items are noted in HPI.  Otherwise, a comprehensive ROS was negative.  Exam:   BP 130/72  Pulse 82  Temp(Src) 97.7 F (36.5 C)  Ht 5' 2.5" (1.588 m)  Wt 154 lb (69.854 kg)  BMI 27.70 kg/m2  Weight change: +7#'s   Height: 5' 2.5" (158.8 cm)  Ht Readings from Last 3 Encounters:  05/06/14 5' 2.5" (1.588 m)  02/15/14 5' 3" (1.6 m)  02/12/14 5' 3" (1.6 m)    General appearance: alert, cooperative and appears stated age Head: Normocephalic, without obvious abnormality, atraumatic Neck: no adenopathy, supple, symmetrical, trachea midline and thyroid normal to inspection and palpation Lungs: clear to auscultation bilaterally Breasts: normal right breast, no LAD, absent left breast Heart: regular rate and rhythm Abdomen: soft, non-tender; bowel sounds normal; no masses,  no organomegaly Extremities: extremities normal, atraumatic, no cyanosis or edema Skin: Skin color, texture, turgor normal. No rashes or lesions Lymph nodes: Cervical, supraclavicular, and axillary nodes normal. No abnormal inguinal nodes palpated Neurologic: Grossly normal   Pelvic: External genitalia:  no lesions              Urethra:  normal appearing urethra with no masses, tenderness or lesions              Bartholins and Skenes: normal                 Vagina: normal appearing vagina with normal color and discharge, no lesions              Cervix: absent              Pap taken: no Bimanual Exam:  Uterus:  uterus absent              Adnexa: no mass, fullness, tenderness               Rectovaginal:  Confirms               Anus:  normal sphincter tone, no  lesions  A:  Well Woman with normal exam  PMP, no HRT H/o borderling mucinous ovarian ca IC, treated 2004 H/O breast cancer 1/02.  Has done very well since treatment. Osteoporosis.  On Zometa 53m every 6 months. Vit D deficiency Elevated lipids 4.0cm aortic root aneurysm.  Stable on yearly CTs.  Last measurement was 3.762m  Noted initially in 2011 COPD.  Followed by Dr. RaLacie DraftAbnormal urine today  P:  Mammogram yearly  No pap smear  Ca-125  [facility] or [facility]n     An After Visit Summary was printed and given to the patient.

## 2014-05-07 LAB — URINE CULTURE
Colony Count: NO GROWTH
Organism ID, Bacteria: NO GROWTH

## 2014-05-07 LAB — URINALYSIS, MICROSCOPIC ONLY
BACTERIA UA: NONE SEEN
Squamous Epithelial / LPF: NONE SEEN

## 2014-05-07 LAB — CA 125: CA 125: 15.4 U/mL (ref 0.0–30.2)

## 2014-05-10 ENCOUNTER — Telehealth: Payer: Self-pay

## 2014-05-10 NOTE — Telephone Encounter (Signed)
Patient notified of results.//kn 

## 2014-05-10 NOTE — Telephone Encounter (Signed)
Patient returning Kelly's call. °

## 2014-05-10 NOTE — Telephone Encounter (Signed)
Lmtcb//kn 

## 2014-05-10 NOTE — Telephone Encounter (Signed)
Patient notified of all results.//kn 

## 2014-05-10 NOTE — Telephone Encounter (Signed)
Message copied by Robley Fries on Mon May 10, 2014  9:12 AM ------      Message from: Megan Salon      Created: Sat May 08, 2014 10:59 PM       Claiborne Billings      Pt is on [place] but will you call her.  Her ca-125 is normal.  H/O borderline ovarian tumor.  Dip u/a in office was abnormal.  Micro shows no blood but some WBCs and calcium oxalate crystals.  She needs to really make sure she drinking adequate water as she is at risk for stones.  No treatment needed. ------

## 2014-05-10 NOTE — Telephone Encounter (Signed)
Patient is calling Crystal Bass back °

## 2014-05-13 ENCOUNTER — Other Ambulatory Visit: Payer: Self-pay | Admitting: *Deleted

## 2014-05-13 MED ORDER — GABAPENTIN 300 MG PO CAPS: ORAL_CAPSULE | ORAL | Status: DC

## 2014-05-19 ENCOUNTER — Encounter: Payer: Self-pay | Admitting: Internal Medicine

## 2014-05-19 ENCOUNTER — Other Ambulatory Visit (INDEPENDENT_AMBULATORY_CARE_PROVIDER_SITE_OTHER): Payer: Medicare Other

## 2014-05-19 ENCOUNTER — Ambulatory Visit (INDEPENDENT_AMBULATORY_CARE_PROVIDER_SITE_OTHER): Payer: Medicare Other | Admitting: Internal Medicine

## 2014-05-19 VITALS — BP 142/100 | HR 68 | Temp 98.2°F | Ht 62.5 in | Wt 152.8 lb

## 2014-05-19 DIAGNOSIS — R259 Unspecified abnormal involuntary movements: Secondary | ICD-10-CM

## 2014-05-19 DIAGNOSIS — E785 Hyperlipidemia, unspecified: Secondary | ICD-10-CM

## 2014-05-19 DIAGNOSIS — Z0000 Encounter for general adult medical examination without abnormal findings: Secondary | ICD-10-CM

## 2014-05-19 DIAGNOSIS — I10 Essential (primary) hypertension: Secondary | ICD-10-CM

## 2014-05-19 DIAGNOSIS — E039 Hypothyroidism, unspecified: Secondary | ICD-10-CM

## 2014-05-19 DIAGNOSIS — R7309 Other abnormal glucose: Secondary | ICD-10-CM

## 2014-05-19 DIAGNOSIS — E559 Vitamin D deficiency, unspecified: Secondary | ICD-10-CM

## 2014-05-19 DIAGNOSIS — R251 Tremor, unspecified: Secondary | ICD-10-CM

## 2014-05-19 LAB — HEMOGLOBIN A1C: HEMOGLOBIN A1C: 5.4 % (ref 4.6–6.5)

## 2014-05-19 LAB — LIPID PANEL
CHOL/HDL RATIO: 4
Cholesterol: 224 mg/dL — ABNORMAL HIGH (ref 0–200)
HDL: 54.4 mg/dL (ref >39.00)
LDL CALC: 127 mg/dL — AB (ref 0–99)
NonHDL: 169.6
Triglycerides: 214 mg/dL — ABNORMAL HIGH (ref 0.0–149.0)
VLDL: 42.8 mg/dL — AB (ref 0.0–40.0)

## 2014-05-19 LAB — HEPATIC FUNCTION PANEL
ALK PHOS: 64 U/L (ref 39–117)
ALT: 19 U/L (ref 0–35)
AST: 28 U/L (ref 0–37)
Albumin: 4.5 g/dL (ref 3.5–5.2)
BILIRUBIN DIRECT: 0.2 mg/dL (ref 0.0–0.3)
BILIRUBIN TOTAL: 1.5 mg/dL — AB (ref 0.2–1.2)
Total Protein: 7.5 g/dL (ref 6.0–8.3)

## 2014-05-19 LAB — BASIC METABOLIC PANEL
BUN: 23 mg/dL (ref 6–23)
CHLORIDE: 100 meq/L (ref 96–112)
CO2: 27 mEq/L (ref 19–32)
Calcium: 9.9 mg/dL (ref 8.4–10.5)
Creatinine, Ser: 0.8 mg/dL (ref 0.4–1.2)
GFR: 74.04 mL/min (ref >60.00)
Glucose, Bld: 99 mg/dL (ref 70–99)
POTASSIUM: 4.3 meq/L (ref 3.5–5.1)
SODIUM: 136 meq/L (ref 135–145)

## 2014-05-19 LAB — TSH: TSH: 1.34 u[IU]/mL (ref 0.35–4.50)

## 2014-05-19 NOTE — Assessment & Plan Note (Signed)
A1c

## 2014-05-19 NOTE — Assessment & Plan Note (Signed)
Lipids, LFTs, TSH  

## 2014-05-19 NOTE — Patient Instructions (Signed)
Your next office appointment will be determined based upon review of your pending labs. Those instructions will be transmitted to you through My Chart. Continue to avoid stimulants :decongestants, diet pills, nicotine, caffeine( coffee, tea,cola, chocolate) to excess & get adequate rest  to prevent tremor.

## 2014-05-19 NOTE — Assessment & Plan Note (Signed)
Blood pressure goals reviewed. BMET 

## 2014-05-19 NOTE — Progress Notes (Signed)
Pre visit review using our clinic review tool, if applicable. No additional management support is needed unless otherwise documented below in the visit note. 

## 2014-05-19 NOTE — Assessment & Plan Note (Signed)
Vitamin D level 
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
## 2014-05-21 DIAGNOSIS — R251 Tremor, unspecified: Secondary | ICD-10-CM | POA: Insufficient documentation

## 2014-05-21 NOTE — Assessment & Plan Note (Signed)
Options discussed ; Propranolol could aggravate COPD. She'll consider Neurology referral to Dr Tat

## 2014-05-23 LAB — VITAMIN D 1,25 DIHYDROXY
VITAMIN D 1, 25 (OH) TOTAL: 32 pg/mL (ref 18–72)
Vitamin D2 1, 25 (OH)2: 16 pg/mL
Vitamin D3 1, 25 (OH)2: 16 pg/mL

## 2014-06-28 ENCOUNTER — Other Ambulatory Visit: Payer: Self-pay | Admitting: Internal Medicine

## 2014-06-28 NOTE — Telephone Encounter (Signed)
Refill X 3 mos

## 2014-07-25 ENCOUNTER — Other Ambulatory Visit: Payer: Self-pay | Admitting: Internal Medicine

## 2014-07-26 ENCOUNTER — Other Ambulatory Visit: Payer: Self-pay

## 2014-07-26 MED ORDER — METOPROLOL TARTRATE 25 MG PO TABS: ORAL_TABLET | ORAL | Status: DC

## 2014-07-26 MED ORDER — LOSARTAN POTASSIUM 50 MG PO TABS: ORAL_TABLET | ORAL | Status: DC

## 2014-07-26 NOTE — Telephone Encounter (Signed)
OK as written

## 2014-08-06 ENCOUNTER — Telehealth: Payer: Self-pay | Admitting: *Deleted

## 2014-08-06 NOTE — Telephone Encounter (Signed)
@  LOGO@  DISPENSING ORDER           @TODAY @   Patient Name Crystal Bass DOB Apr 27, 1941    Dx:   Mastectomy     x Partial Mastectomy  For Continued Medical Need   Dx Code: 174.9     x V45.71  Side: Rt  Lt  Bi      L8015 Post-Mastectomy Camisole - to hold drain tubes and wear during healing.   Qty 2 Length of Use 3 Months No Refills     x L8000 Post-Surgical Bras - to hold prosthesis.     Qty 6 Length of Use 3 Months N3 Refills    C6237 Non-Silicone Breast Prosthesis - for temporary use during healing or for      comfort at and of day, lighter weight, cooler for hot weather.    Qty 1 for Single, 2 for Bilateral Length of Use 6 Months Up to 1 Refill     x  S2831 Silicone Breast Prosthesis - to restore balance and symmetry after breast     surgery.   Qty 1 for Single, 2 for Bilateral Length of Use 2 Years No Refill    L803 Nipple Prosthesis - to restore symmetry of the breast.   Qty 1 for Single, 2 for Bilateral Length of Use 2 Years No Refill   Continued use of above listed mastectomy products is medically necessary.   Provider Signature  Date @date @     NPI  [id number]

## 2014-08-09 ENCOUNTER — Other Ambulatory Visit (HOSPITAL_BASED_OUTPATIENT_CLINIC_OR_DEPARTMENT_OTHER): Payer: Medicare Other

## 2014-08-09 DIAGNOSIS — A319 Mycobacterial infection, unspecified: Secondary | ICD-10-CM

## 2014-08-09 DIAGNOSIS — C50919 Malignant neoplasm of unspecified site of unspecified female breast: Secondary | ICD-10-CM

## 2014-08-09 DIAGNOSIS — Z853 Personal history of malignant neoplasm of breast: Secondary | ICD-10-CM

## 2014-08-09 DIAGNOSIS — Z170 Estrogen receptor positive status [ER+]: Principal | ICD-10-CM

## 2014-08-09 LAB — COMPREHENSIVE METABOLIC PANEL (CC13)
ALT: 11 U/L (ref 0–55)
ANION GAP: 8 meq/L (ref 3–11)
AST: 16 U/L (ref 5–34)
Albumin: 3.5 g/dL (ref 3.5–5.0)
Alkaline Phosphatase: 70 U/L (ref 40–150)
BILIRUBIN TOTAL: 0.7 mg/dL (ref 0.20–1.20)
BUN: 17.4 mg/dL (ref 7.0–26.0)
CALCIUM: 9.8 mg/dL (ref 8.4–10.4)
CHLORIDE: 106 meq/L (ref 98–109)
CO2: 27 meq/L (ref 22–29)
Creatinine: 0.9 mg/dL (ref 0.6–1.1)
Glucose: 103 mg/dl (ref 70–140)
Potassium: 3.7 mEq/L (ref 3.5–5.1)
Sodium: 140 mEq/L (ref 136–145)
Total Protein: 6.8 g/dL (ref 6.4–8.3)

## 2014-08-09 LAB — CBC WITH DIFFERENTIAL/PLATELET
BASO%: 1 % (ref 0.0–2.0)
BASOS ABS: 0.1 10*3/uL (ref 0.0–0.1)
EOS%: 4.3 % (ref 0.0–7.0)
Eosinophils Absolute: 0.3 10*3/uL (ref 0.0–0.5)
HCT: 39.3 % (ref 34.8–46.6)
HGB: 12.7 g/dL (ref 11.6–15.9)
LYMPH#: 1.4 10*3/uL (ref 0.9–3.3)
LYMPH%: 21.4 % (ref 14.0–49.7)
MCH: 30.4 pg (ref 25.1–34.0)
MCHC: 32.2 g/dL (ref 31.5–36.0)
MCV: 94.2 fL (ref 79.5–101.0)
MONO#: 0.6 10*3/uL (ref 0.1–0.9)
MONO%: 10.1 % (ref 0.0–14.0)
NEUT#: 4 10*3/uL (ref 1.5–6.5)
NEUT%: 63.2 % (ref 38.4–76.8)
PLATELETS: 187 10*3/uL (ref 145–400)
RBC: 4.17 10*6/uL (ref 3.70–5.45)
RDW: 13.5 % (ref 11.2–14.5)
WBC: 6.3 10*3/uL (ref 3.9–10.3)

## 2014-08-20 ENCOUNTER — Other Ambulatory Visit: Payer: Self-pay | Admitting: Hematology

## 2014-08-20 ENCOUNTER — Telehealth: Payer: Self-pay | Admitting: Hematology

## 2014-08-20 ENCOUNTER — Ambulatory Visit (HOSPITAL_BASED_OUTPATIENT_CLINIC_OR_DEPARTMENT_OTHER): Payer: Medicare Other | Admitting: Hematology

## 2014-08-20 VITALS — BP 121/75 | HR 59 | Temp 98.5°F | Resp 20 | Ht 62.5 in | Wt 154.1 lb

## 2014-08-20 DIAGNOSIS — A310 Pulmonary mycobacterial infection: Secondary | ICD-10-CM

## 2014-08-20 DIAGNOSIS — Z9010 Acquired absence of unspecified breast and nipple: Secondary | ICD-10-CM

## 2014-08-20 DIAGNOSIS — Z23 Encounter for immunization: Secondary | ICD-10-CM

## 2014-08-20 DIAGNOSIS — Z9012 Acquired absence of left breast and nipple: Secondary | ICD-10-CM

## 2014-08-20 DIAGNOSIS — R918 Other nonspecific abnormal finding of lung field: Secondary | ICD-10-CM | POA: Diagnosis not present

## 2014-08-20 DIAGNOSIS — I10 Essential (primary) hypertension: Secondary | ICD-10-CM

## 2014-08-20 DIAGNOSIS — D0512 Intraductal carcinoma in situ of left breast: Secondary | ICD-10-CM

## 2014-08-20 DIAGNOSIS — Z8543 Personal history of malignant neoplasm of ovary: Secondary | ICD-10-CM

## 2014-08-20 DIAGNOSIS — Z1231 Encounter for screening mammogram for malignant neoplasm of breast: Secondary | ICD-10-CM

## 2014-08-20 DIAGNOSIS — Z853 Personal history of malignant neoplasm of breast: Secondary | ICD-10-CM

## 2014-08-20 DIAGNOSIS — M810 Age-related osteoporosis without current pathological fracture: Secondary | ICD-10-CM

## 2014-08-20 DIAGNOSIS — C50919 Malignant neoplasm of unspecified site of unspecified female breast: Secondary | ICD-10-CM

## 2014-08-20 MED ORDER — ZOLEDRONIC ACID 4 MG/5ML IV CONC: 4.0000 mg | INTRAVENOUS | Status: DC

## 2014-08-20 MED ORDER — INFLUENZA VAC SPLIT QUAD 0.5 ML IM SUSY
0.5000 mL | PREFILLED_SYRINGE | Freq: Once | INTRAMUSCULAR | Status: AC
  Administered 2014-08-20: 0.5 mL via INTRAMUSCULAR
  Filled 2014-08-20: qty 0.5

## 2014-08-20 NOTE — Telephone Encounter (Signed)
Pt confirmed labs/ov per 09/25 POF, walk in at [facility] to have CXR, scheduled MM for 2016 and CT chest for March 2016, sent msg to Dr. Lona Kettle for request order for BD at [facility] and [facility], gave pt AVS.....   KJ

## 2014-08-20 NOTE — Patient Instructions (Signed)
Influenza Vaccine (Flu Vaccine, Inactivated or Recombinant) 2014-2015: What You Need to Know 1. Why get vaccinated? Influenza ("flu") is a contagious disease that spreads around the [place] every winter, usually between October and May. Flu is caused by influenza viruses, and is spread mainly by coughing, sneezing, and close contact. Anyone can get flu, but the risk of getting flu is highest among children. Symptoms come on suddenly and may last several days. They can include:  fever/chills  sore throat  muscle aches  fatigue  cough  headache  runny or stuffy nose Flu can make some people much sicker than others. These people include young children, people 65 and older, pregnant women, and people with certain health conditions-such as heart, lung or kidney disease, nervous system disorders, or a weakened immune system. Flu vaccination is especially important for these people, and anyone in close contact with them. Flu can also lead to pneumonia, and make existing medical conditions worse. It can cause diarrhea and seizures in children. Each year thousands of people in the [place] die from flu, and many more are hospitalized. Flu vaccine is the best protection against flu and its complications. Flu vaccine also helps prevent spreading flu from person to person. 2. Inactivated and recombinant flu vaccines You are getting an injectable flu vaccine, which is either an "inactivated" or "recombinant" vaccine. These vaccines do not contain any live influenza virus. They are given by injection with a needle, and often called the "flu shot."  A different live, attenuated (weakened) influenza vaccine is sprayed into the nostrils. This vaccine is described in a separate Vaccine Information Statement. Flu vaccination is recommended every year. Some children 6 months through 8 years of age might need two doses during one year. Flu viruses are always changing. Each year's flu vaccine is made  to protect against 3 or 4 viruses that are likely to cause disease that year. Flu vaccine cannot prevent all cases of flu, but it is the best defense against the disease.  It takes about 2 weeks for protection to develop after the vaccination, and protection lasts several months to a year. Some illnesses that are not caused by influenza virus are often mistaken for flu. Flu vaccine will not prevent these illnesses. It can only prevent influenza. Some inactivated flu vaccine contains a very small amount of a mercury-based preservative called thimerosal. Studies have shown that thimerosal in vaccines is not harmful, but flu vaccines that do not contain a preservative are available. 3. Some people should not get this vaccine Tell the person who gives you the vaccine:  If you have any severe, life-threatening allergies. If you ever had a life-threatening allergic reaction after a dose of flu vaccine, or have a severe allergy to any part of this vaccine, including (for example) an allergy to gelatin, antibiotics, or eggs, you may be advised not to get vaccinated. Most, but not all, types of flu vaccine contain a small amount of egg protein.  If you ever had Guillain-Barr Syndrome (a severe paralyzing illness, also called GBS). Some people with a history of GBS should not get this vaccine. This should be discussed with your doctor.  If you are not feeling well. It is usually okay to get flu vaccine when you have a mild illness, but you might be advised to wait until you feel better. You should come back when you are better. 4. Risks of a vaccine reaction With a vaccine, like any medicine, there is a chance of side   effects. These are usually mild and go away on their own. Problems that could happen after any vaccine:  Brief fainting spells can happen after any medical procedure, including vaccination. Sitting or lying down for about 15 minutes can help prevent fainting, and injuries caused by a fall. Tell  your doctor if you feel dizzy, or have vision changes or ringing in the ears.  Severe shoulder pain and reduced range of motion in the arm where a shot was given can happen, very rarely, after a vaccination.  Severe allergic reactions from a vaccine are very rare, estimated at less than 1 in a million doses. If one were to occur, it would usually be within a few minutes to a few hours after the vaccination. Mild problems following inactivated flu vaccine:  soreness, redness, or swelling where the shot was given  hoarseness  sore, red or itchy eyes  cough  fever  aches  headache  itching  fatigue If these problems occur, they usually begin soon after the shot and last 1 or 2 days. Moderate problems following inactivated flu vaccine:  Young children who get inactivated flu vaccine and pneumococcal vaccine (PCV13) at the same time may be at increased risk for seizures caused by fever. Ask your doctor for more information. Tell your doctor if a child who is getting flu vaccine has ever had a seizure. Inactivated flu vaccine does not contain live flu virus, so you cannot get the flu from this vaccine. As with any medicine, there is a very remote chance of a vaccine causing a serious injury or death. The safety of vaccines is always being monitored. For more information, visit: www.cdc.gov/vaccinesafety/ 5. What if there is a serious reaction? What should I look for?  Look for anything that concerns you, such as signs of a severe allergic reaction, very high fever, or behavior changes. Signs of a severe allergic reaction can include hives, swelling of the face and throat, difficulty breathing, a fast heartbeat, dizziness, and weakness. These would start a few minutes to a few hours after the vaccination. What should I do?  If you think it is a severe allergic reaction or other emergency that can't wait, call 9-1-1 and get the person to the nearest hospital. Otherwise, call your  doctor.  Afterward, the reaction should be reported to the Vaccine Adverse Event Reporting System (VAERS). Your doctor should file this report, or you can do it yourself through the VAERS web site at www.vaers.hhs.gov, or by calling 1-800-822-7967. VAERS does not give medical advice. 6. The National Vaccine Injury Compensation Program The National Vaccine Injury Compensation Program (VICP) is a federal program that was created to compensate people who may have been injured by certain vaccines. Persons who believe they may have been injured by a vaccine can learn about the program and about filing a claim by calling 1-800-338-2382 or visiting the VICP website at www.hrsa.gov/vaccinecompensation. There is a time limit to file a claim for compensation. 7. How can I learn more?  Ask your health care provider.  Call your local or state health department.  Contact the Centers for Disease Control and Prevention (CDC):  Call 1-800-232-4636 (1-800-CDC-INFO) or  Visit CDC's website at www.cdc.gov/flu CDC Vaccine Information Statement (Interim) Inactivated Influenza Vaccine (07/14/2013) Document Released: 09/06/2006 Document Revised: 03/29/2014 Document Reviewed: 10/30/2013 [employer] Patient Information 2015 [employer]. This information is not intended to replace advice given to you by your health care provider. Make sure you discuss any questions you have with your health   care provider.  1. Why get vaccinated? Influenza ("flu") is a contagious disease that spreads around the [place] every winter, usually between October and May. Flu is caused by influenza viruses, and is spread mainly by coughing, sneezing, and close contact. Anyone can get flu, but the risk of getting flu is highest among children. Symptoms come on suddenly and may last several days. They can include:  fever/chills  sore throat  muscle aches  fatigue  cough  headache  runny or stuffy nose Flu can make some  people much sicker than others. These people include young children, people 71 and older, pregnant women, and people with certain health conditions-such as heart, lung or kidney disease, nervous system disorders, or a weakened immune system. Flu vaccination is especially important for these people, and anyone in close contact with them. Flu can also lead to pneumonia, and make existing medical conditions worse. It can cause diarrhea and seizures in children. Each year thousands of people in the [place] die from flu, and many more are hospitalized. Flu vaccine is the best protection against flu and its complications. Flu vaccine also helps prevent spreading flu from person to person. 2. Inactivated and recombinant flu vaccines You are getting an injectable flu vaccine, which is either an "inactivated" or "recombinant" vaccine. These vaccines do not contain any live influenza virus. They are given by injection with a needle, and often called the "flu shot."  A different live, attenuated (weakened) influenza vaccine is sprayed into the nostrils. This vaccine is described in a separate Vaccine Information Statement. Flu vaccination is recommended every year. Some children 6 months through 66 years of age might need two doses during one year. Flu viruses are always changing. Each year's flu vaccine is made to protect against 3 or 4 viruses that are likely to cause disease that year. Flu vaccine cannot prevent all cases of flu, but it is the best defense against the disease.  It takes about 2 weeks for protection to develop after the vaccination, and protection lasts several months to a year. Some illnesses that are not caused by influenza virus are often mistaken for flu. Flu vaccine will not prevent these illnesses. It can only prevent influenza. Some inactivated flu vaccine contains a very small amount of a mercury-based preservative called thimerosal. Studies have shown that thimerosal in vaccines is  not harmful, but flu vaccines that do not contain a preservative are available. 3. Some people should not get this vaccine Tell the person who gives you the vaccine:  If you have any severe, life-threatening allergies. If you ever had a life-threatening allergic reaction after a dose of flu vaccine, or have a severe allergy to any part of this vaccine, including (for example) an allergy to gelatin, antibiotics, or eggs, you may be advised not to get vaccinated. Most, but not all, types of flu vaccine contain a small amount of egg protein.  If you ever had Guillain-Barr Syndrome (a severe paralyzing illness, also called GBS). Some people with a history of GBS should not get this vaccine. This should be discussed with your doctor.  If you are not feeling well. It is usually okay to get flu vaccine when you have a mild illness, but you might be advised to wait until you feel better. You should come back when you are better. 4. Risks of a vaccine reaction With a vaccine, like any medicine, there is a chance of side effects. These are usually mild and go away on  their own. Problems that could happen after any vaccine:  Brief fainting spells can happen after any medical procedure, including vaccination. Sitting or lying down for about 15 minutes can help prevent fainting, and injuries caused by a fall. Tell your doctor if you feel dizzy, or have vision changes or ringing in the ears.  Severe shoulder pain and reduced range of motion in the arm where a shot was given can happen, very rarely, after a vaccination.  Severe allergic reactions from a vaccine are very rare, estimated at less than 1 in a million doses. If one were to occur, it would usually be within a few minutes to a few hours after the vaccination. Mild problems following inactivated flu vaccine:  soreness, redness, or swelling where the shot was given  hoarseness  sore, red or itchy  eyes  cough  fever  aches  headache  itching  fatigue If these problems occur, they usually begin soon after the shot and last 1 or 2 days. Moderate problems following inactivated flu vaccine:  Young children who get inactivated flu vaccine and pneumococcal vaccine (PCV13) at the same time may be at increased risk for seizures caused by fever. Ask your doctor for more information. Tell your doctor if a child who is getting flu vaccine has ever had a seizure. Inactivated flu vaccine does not contain live flu virus, so you cannot get the flu from this vaccine. As with any medicine, there is a very remote chance of a vaccine causing a serious injury or death. The safety of vaccines is always being monitored. For more information, visit: http://www.aguilar.org/ 5. What if there is a serious reaction? What should I look for?  Look for anything that concerns you, such as signs of a severe allergic reaction, very high fever, or behavior changes. Signs of a severe allergic reaction can include hives, swelling of the face and throat, difficulty breathing, a fast heartbeat, dizziness, and weakness. These would start a few minutes to a few hours after the vaccination. What should I do?  If you think it is a severe allergic reaction or other emergency that can't wait, call 9-1-1 and get the person to the nearest hospital. Otherwise, call your doctor.  Afterward, the reaction should be reported to the Vaccine Adverse Event Reporting System (VAERS). Your doctor should file this report, or you can do it yourself through the VAERS web site at www.vaers.SamedayNews.es, or by calling 575-769-2319. VAERS does not give medical advice. 6. The National Vaccine Injury Compensation Program The [employer] Vaccine Injury Compensation Program (VICP) is a federal program that was created to compensate people who may have been injured by certain vaccines. Persons who believe they may have been injured by a vaccine  can learn about the program and about filing a claim by calling 306-592-9537 or visiting the [place] website at GoldCloset.com.ee. There is a time limit to file a claim for compensation. 7. How can I learn more?  Ask your health care provider.  Call your local or state health department.  Contact the Centers for Disease Control and Prevention (CDC):  Call (707)336-0484 (1-800-CDC-INFO) or  Visit CDC's website at https://gibson.com/ CDC Vaccine Information Statement (Interim) Inactivated Influenza Vaccine (07/14/2013) Document Released: 09/06/2006 Document Revised: 03/29/2014 Document Reviewed: 10/30/2013 [facility] Patient Information 2015 [place]. This information is not intended to replace advice given to you by your health care provider. Make sure you discuss any questions you have with your health care provider.

## 2014-08-21 ENCOUNTER — Encounter: Payer: Self-pay | Admitting: Hematology

## 2014-08-21 NOTE — Progress Notes (Signed)
Hematology and Oncology Follow Up Visit Date of Visit: 08/20/2014  Crystal Bass [id number] [birth year] 73 y.o. 08/21/2014 10:44 PM   Principle Diagnosis: Encounter Diagnoses  Name Primary?  [name] [name] (ductal carcinoma in situ) of breast, left Yes  . [name] (mycobacterium avium-intracellulare) infection     Interim History:   Followup visit for this 73 year old woman with a history of stage I ER positive cancer of the left breast diagnosed in January 2002. She was last seen by Dr Beryle Beams on 02/15/2014 and comes for her follow up visit. She was treated with a left modified radical mastectomy then initial tamoxifen hormonal therapy and then transitioned to an aromatase inhibitor which she took through 2012.[name] She had a single small pulmonary lesion that was positive on a PET scan at time of diagnosis.. It was surgically resected and found to be an area of Mycobacterium avium tuberculosis. CT scans of the chest show multiple bilateral areas of nodular pulmonary infiltrates which have fluctuated over time but have been overall stable and then any signs of malignant change. She has been treated on at least 2 or 3 occasions in the past for [name]. She has a chronic cough, and I believe that she has developed bronchiectasis. She is not immunocompromised as far as I can tell. She is followed by pulmonary medicine Dr. Chase Caller once a year. She has had no interim medical problems. She got through the winter without any serious infections. No change in her chronic cough or dyspnea with mild exertion. She has not required home oxygen.  She had labs done on 08/09/2014. She had a screening digital right breast mammogram performed on 02/22/2014 which was negative BI-RADS category 1 exam. Her last CT scan of chest was on 02/04/2014 which showed stable findings. Overall similar distribution of scarring and reticular nodular  opacity, likely due to chronic atypical infection. No evidence of acute superimposed process or  suspicious dominant pulmonary nodule. Left mastectomy and axillary node dissection, without evidence of metastatic disease. Similar hyperenhancing focus in the right lobe of the liver, likely a hemangioma.  Patient has been getting Zometa infusions here every 6 months for her diagnosis of Osteoporosis. Last BD was 04/29/2013 showing Osteoporosis with a T-score of -2.6 in AP lumbar spine. Left femur T score was -2.1.   She states that she gets a Chest Xray every year and a CT scan chest every year with a 6 month gap between them so she is getting a chest imaging every 6 months and Dr Beryle Beams has been ordering those tests not her pulmonologist. Based upon her request, I will order a chest xray to be done in 1 week and then do a surveillance CT chest in 6 months around March 2016. Her mammogram will be due in April 2016. She did get the Flu shot in office today. We reviewed labs and performed a dedicated breast exam in office today.  Medications: Reviewed.  Tylenol, Aspirin, Calcium and Vitamin D, Zometa, [name], Cozaar, Lopressor, Symbicort, [name], Proair HFA, Imitrex,   Allergies:  Allergies  Allergen Reactions  . Sulfonamide Derivatives Anaphylaxis  . Topamax [Topiramate] Other (See Comments)    Metabolic acidosis   . Biaxin [Clarithromycin] Other (See Comments)    pericarditis  . Hydrocodone Other (See Comments)    "hyper and climbing the walls"  . Motrin [Ibuprofen] Other (See Comments)    headaches  . Percocet [Oxycodone-Acetaminophen] Itching and Rash  . Tape Itching and Rash    Use paper tape only  .  Tetracyclines & Related Other (See Comments)    "immediate yeast infection"    Review of Systems: Hematology:  No bleeding or bruising ENT ROS: No sore throat Breast ROS: No right breast lumps, chronic phantom pain left axilla. Respiratory ROS: See above, dry cough stable. Hx [name] infection lung. Cardiovascular ROS:  No ischemic type chest pain or  palpitations Gastrointestinal ROS:  No abdominal pain or change in bowel habit  Genito-Urinary ROS: Not questioned Musculoskeletal ROS: No muscle or bone pain, +osteoporosis Neurological ROS: No recent flareups of chronic migraine headache or change in vision Dermatological ROS: No rash or ecchymosis Remaining ROS negative:   Physical Exam: Blood pressure 121/75, pulse 59, temperature 98.5 F (36.9 C), temperature source Oral, resp. rate 20, height 5' 2.5" (1.588 m), weight 154 lb 1.6 oz (69.899 kg). Wt Readings from Last 3 Encounters:  08/20/14 154 lb 1.6 oz (69.899 kg)  05/19/14 152 lb 12.8 oz (69.31 kg)  05/06/14 154 lb (69.854 kg)  ECOG PS 0-1   General appearance: Well-nourished Caucasian woman HENNT: Pharynx no erythema, exudate, mass, or ulcer. No thyromegaly or thyroid nodules Lymph nodes: No cervical, supraclavicular, or axillary lymphadenopathy Breasts: Left mastectomy. No chest wall lesions. No right breast masses. Lungs: Clear to auscultation, resonant to percussion throughout Heart: Regular rhythm, no murmur, no gallop, no rub, no click, no edema Abdomen: Soft, nontender, normal bowel sounds, no mass, no organomegaly Extremities: No edema, no calf tenderness Musculoskeletal: no joint deformities Vascular: Carotid pulses 2+, no bruits, Neurologic: Alert, oriented, PERRLA,, cranial nerves grossly normal, motor strength 5 over 5, reflexes 1+ symmetric, upper body coordination normal, gait normal, Skin: No rash or ecchymosis  Lab Results: CBC W/Diff    Component Value Date/Time   WBC 6.3 08/09/2014 1443   WBC 2.8* 03/07/2011 1556   RBC 4.17 08/09/2014 1443   RBC 4.18 03/07/2011 1556   HGB 12.7 08/09/2014 1443   HGB 13.7 03/07/2011 1556   HCT 39.3 08/09/2014 1443   HCT 39.8 03/07/2011 1556   PLT 187 08/09/2014 1443   PLT 188.0 03/07/2011 1556   MCV 94.2 08/09/2014 1443   MCV 95.3 03/07/2011 1556   MCH 30.4 08/09/2014 1443   MCH 32.8 09/18/2010 1509   MCHC 32.2 08/09/2014  1443   MCHC 34.4 03/07/2011 1556   RDW 13.5 08/09/2014 1443   RDW 12.9 03/07/2011 1556   LYMPHSABS 1.4 08/09/2014 1443   LYMPHSABS 1.5 03/07/2011 1556   MONOABS 0.6 08/09/2014 1443   MONOABS 0.5 03/07/2011 1556   EOSABS 0.3 08/09/2014 1443   EOSABS 0.1 03/07/2011 1556   BASOSABS 0.1 08/09/2014 1443   BASOSABS 0.0 03/07/2011 1556     Chemistry      Component Value Date/Time   NA 140 08/09/2014 1443   NA 136 05/19/2014 1558   K 3.7 08/09/2014 1443   K 4.3 05/19/2014 1558   CL 100 05/19/2014 1558   CL 102 03/10/2013 1529   CO2 27 08/09/2014 1443   CO2 27 05/19/2014 1558   BUN 17.4 08/09/2014 1443   BUN 23 05/19/2014 1558   CREATININE 0.9 08/09/2014 1443   CREATININE 0.8 05/19/2014 1558      Component Value Date/Time   CALCIUM 9.8 08/09/2014 1443   CALCIUM 9.9 05/19/2014 1558   ALKPHOS 70 08/09/2014 1443   ALKPHOS 64 05/19/2014 1558   AST 16 08/09/2014 1443   AST 28 05/19/2014 1558   ALT 11 08/09/2014 1443   ALT 19 05/19/2014 1558   BILITOT 0.70 08/09/2014 1443  BILITOT 1.5* 05/19/2014 1558       Radiological Studies: Ct Chest W Contrast  02/04/2014   CLINICAL DATA:  Followup of chronic [name] infection. Chronic shortness of breath. Upper chest pain for 2 weeks. Left mastectomy and right lung biopsy in 2001. Ovarian cancer in 2004, treated with surgery.  EXAM: CT CHEST WITH CONTRAST  TECHNIQUE: Multidetector CT imaging of the chest was performed during intravenous contrast administration.  CONTRAST:  75 cc Omnipaque 300  COMPARISON:  CT CHEST W/CM dated 07/14/2013; CT CHEST W/CM dated 09/24/2011  FINDINGS: Lungs/Pleura: Atypical right medial upper lung morphology could relate to scarring or accessory fissure/lobe. This is unchanged.  Overall similar distribution of scattered tiny pulmonary nodules and areas of reticular nodular opacity. Some nodules are calcified. The posterior right lung tandem nodules described on the prior exam are not identified. There is clustered nodularity in this area, including on  image 23/series 4. Spiculated dominant nodule in the superior segment right lower lobe measures 1.2 x 0.7 cm on image 35 and is similar 1.3 x 0.9 cm on the prior exam.  No areas of consolidation or superimposed acute process. There is a tiny left lower lobe nodule which is not readily apparent on the prior exam. 2 mm on image 28/series 4 today.  No pleural fluid.  Heart/Mediastinum: No axillary adenopathy. Left axillary node dissection and mastectomy. Ascending aorta tortuous and upper normal in size, 3.7 cm. Mild cardiomegaly with left atrial enlargement. No central pulmonary embolism, on this non-dedicated study. No mediastinal or definite hilar adenopathy. No internal mammary adenopathy.  Upper Abdomen: Hyperenhancing focus in the right lobe of the liver which is unchanged on image 66. 1.1 cm. Present back to 09/24/11, most consistent with a benign etiology. Normal adrenal glands.  Bones/Musculoskeletal:  No acute osseous abnormality.  IMPRESSION: 1. Overall similar distribution of scarring and reticular nodular opacity, likely due to chronic atypical infection. 2. No evidence of acute superimposed process or suspicious dominant pulmonary nodule. 3. Left mastectomy and axillary node dissection, without evidence of metastatic disease. 4. Similar hyperenhancing focus in the right lobe of the liver, likely a hemangioma.   Electronically Signed   By: Abigail Miyamoto M.D.   On: 02/04/2014 11:19   Right Breast Screening Mammogram: BI-RADS category 1 exam.  Assessment/Plan:   #1. Stage I, ER positive breast cancer treated as outlined above.  No evidence for recurrence now out over 13 years from diagnosis in January 2002. She was given the option of graduating from our practice but she would like to be seen on an annual basis. RTC in 6 months.Today her labs and physical exam including breast exam was normal.  #2. Chronic [name] infection  She has not required any active antibiotic treatment for this for many years.  Lung exam was normal.  #3. Chronic nodular pulmonary infiltrates secondary to #2.  Most recent CT scan done 02/04/2014 stable compared to prior studies. Multiple small bilateral pulmonary nodules stable.    #4. Progressive respiratory deterioration over time  Likely bronchiectasis from chronic cough and ongoing inflammatory changes in the lung. . #5. History of migraine headaches-stable.  #6. Low grade ovarian cancer status post hysterectomy plus oophorectomy in May 2004   #7 essential hypertension   #8. 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 Borderline Hypercalcemia. High normal parathyroid hormone obtained 12 months ago.   #10 Follow up. Zometa to be given 09/15/2014 and then April 2016. Bone density due in June 2016. We  will order on her next visit. Right breast mammogram schedule in April 2016. CT chest annual schedule in April 2016 and a chest xray to be done in 1 week. RTC with labs in 6 months.   Bernadene Bell, MD Medical Hematologist/Oncologist [place] Pager: [phone] Office No: [phone]     CC: Patient Care Team: Hendricks Limes, MD as PCP - General (Internal Medicine) Hendricks Limes, MD as Referring Physician (Internal Medicine) Annia Belt, MD as Consulting Physician (Oncology) Michel Bickers, MD as Consulting Physician (Infectious Diseases)

## 2014-08-23 ENCOUNTER — Other Ambulatory Visit: Payer: Self-pay

## 2014-08-23 MED ORDER — VITAMIN D (ERGOCALCIFEROL) 1.25 MG (50000 UNIT) PO CAPS
ORAL_CAPSULE | ORAL | Status: DC
Start: 2014-08-23 — End: 2015-10-01

## 2014-08-23 NOTE — Telephone Encounter (Signed)
Last AEX: 05/06/14 Last refill:06/22/13 #26 X 4 Current AEX: 05/20/15 Last Vitamin D: 05/19/14, 32  Please advise (Dr. Sabra Heck Pt)

## 2014-08-24 ENCOUNTER — Telehealth: Payer: Self-pay | Admitting: *Deleted

## 2014-08-24 NOTE — Telephone Encounter (Signed)
Per staff message and POF I have scheduled appts. Advised scheduler of appts. JMW  

## 2014-09-01 ENCOUNTER — Other Ambulatory Visit: Payer: Self-pay | Admitting: Internal Medicine

## 2014-09-14 ENCOUNTER — Ambulatory Visit (HOSPITAL_BASED_OUTPATIENT_CLINIC_OR_DEPARTMENT_OTHER): Payer: Medicare Other

## 2014-09-14 VITALS — BP 138/81 | HR 74 | Temp 99.0°F | Resp 18

## 2014-09-14 DIAGNOSIS — M810 Age-related osteoporosis without current pathological fracture: Secondary | ICD-10-CM | POA: Diagnosis not present

## 2014-09-14 MED ORDER — ZOLEDRONIC ACID 4 MG/100ML IV SOLN
4.0000 mg | Freq: Once | INTRAVENOUS | Status: AC
  Administered 2014-09-14: 4 mg via INTRAVENOUS
  Filled 2014-09-14: qty 100

## 2014-09-14 NOTE — Patient Instructions (Signed)

## 2014-09-16 ENCOUNTER — Ambulatory Visit
Admission: RE | Admit: 2014-09-16 | Discharge: 2014-09-16 | Disposition: A | Discharge status: Home / Self Care | Payer: Medicare Other | Source: Ambulatory Visit | Attending: Hematology | Admitting: Hematology

## 2014-09-16 DIAGNOSIS — D0512 Intraductal carcinoma in situ of left breast: Secondary | ICD-10-CM

## 2014-10-19 ENCOUNTER — Other Ambulatory Visit: Payer: Self-pay | Admitting: Obstetrics and Gynecology

## 2014-10-27 ENCOUNTER — Other Ambulatory Visit: Payer: Self-pay | Admitting: Obstetrics and Gynecology

## 2014-10-27 NOTE — Telephone Encounter (Signed)
Last refill 08/23/14 #26/1Refill Last AEX 05/06/14

## 2014-11-27 ENCOUNTER — Other Ambulatory Visit: Payer: Self-pay | Admitting: Internal Medicine

## 2014-11-29 NOTE — Telephone Encounter (Signed)
OK X 3 mos 

## 2015-01-26 ENCOUNTER — Telehealth: Payer: Self-pay | Admitting: Hematology and Oncology

## 2015-01-26 NOTE — Telephone Encounter (Signed)
Phone has been disconet.  A letter and new appt calendar have been mailed to the pt  Crystal Bass

## 2015-02-04 ENCOUNTER — Ambulatory Visit: Payer: Self-pay | Admitting: Primary Care

## 2015-02-07 ENCOUNTER — Encounter: Payer: Self-pay | Admitting: Internal Medicine

## 2015-02-07 ENCOUNTER — Ambulatory Visit (INDEPENDENT_AMBULATORY_CARE_PROVIDER_SITE_OTHER): Payer: Medicare Other | Admitting: Internal Medicine

## 2015-02-07 ENCOUNTER — Telehealth: Payer: Self-pay | Admitting: Internal Medicine

## 2015-02-07 VITALS — BP 140/96 | HR 71 | Temp 98.1°F | Resp 16 | Ht 63.0 in | Wt 158.8 lb

## 2015-02-07 DIAGNOSIS — I10 Essential (primary) hypertension: Secondary | ICD-10-CM

## 2015-02-07 DIAGNOSIS — E782 Mixed hyperlipidemia: Secondary | ICD-10-CM

## 2015-02-07 DIAGNOSIS — R251 Tremor, unspecified: Secondary | ICD-10-CM | POA: Insufficient documentation

## 2015-02-07 DIAGNOSIS — R1314 Dysphagia, pharyngoesophageal phase: Secondary | ICD-10-CM | POA: Insufficient documentation

## 2015-02-07 MED ORDER — METOPROLOL TARTRATE 25 MG PO TABS: ORAL_TABLET | ORAL | Status: DC

## 2015-02-07 MED ORDER — LOSARTAN POTASSIUM 50 MG PO TABS: ORAL_TABLET | ORAL | Status: DC

## 2015-02-07 MED ORDER — GABAPENTIN 300 MG PO CAPS: ORAL_CAPSULE | ORAL | Status: DC

## 2015-02-07 NOTE — Assessment & Plan Note (Signed)
Blood pressure goals reviewed. BMET 

## 2015-02-07 NOTE — Progress Notes (Signed)
   Subjective:    Patient ID: Crystal Bass, female    DOB: 11-Nov-1941, 74 y.o.   MRN: [id number]  HPI  She is here concerned about a tremor present in both upper extremities; this appears more pronounced in the right upper extremity than the left. It is worse with intentional maneuvers such as writing or driving. It does improve when she stops the activity.She denies excess stimulat intake. She questioned whether it might be related to her Symbicort. It is 80% better holding that medication.  There is no family history of Parkinson's. An aunt did have myasthenia.  She is on a heart healthy diet; she's walking approximately 15 minutes intermittently  for exercise. She is limited by her chronic obstructive asthma for which the Symbicort was prescribed.  Blood pressure ranges 118-120/78 home. She has been compliant with her antihypertensive medications.  She is not on a statin. Her last lipids in June 2015 revealed that her triglycerides had increased from 107-214.  She does have some exertional dyspnea. She brings up pea size plugs through the day.  Intermittently she describes some dysphagia particularly if she rushes meals.  Also intermittent is RUQ abd pain which appears to be mainly positional such as with flexing @ the waist. She is followed by Dr. [name], gastroneurologist.    Review of Systems   Chest pain, palpitations, tachycardia,  paroxysmal nocturnal dyspnea, claudication or edema are absent.  Unexplained weight loss, abdominal pain, significant dyspepsia,  melena, rectal bleeding, or persistently small caliber stools are denied.      Objective:   Physical Exam Pertinent or positive findings include: Mask facies are suggested.  She has a coarse pill rolling type tremor of the right upper extremity.  She turns within a short radius with short, choppy steps. Dorsalis pedis pulses are decreased.  General appearance :adequately nourished; in no distress. Eyes: No  conjunctival inflammation or scleral icterus is present. Oral exam:  Lips and gums are healthy appearing.There is no oropharyngeal erythema or exudate noted. Dental hygiene is good. Heart:  Normal rate and regular rhythm. S1 and S2 normal without gallop, murmur, click, rub or other extra sounds   Lungs:Chest clear to auscultation; no wheezes, rhonchi,rales ,or rubs present.No increased work of breathing. Breath sounds slightly decreased. Abdomen: bowel sounds normal, soft and non-tender without masses, organomegaly or hernias noted.  No guarding or rebound.  Vascular : all pulses equal ; no bruits present. Skin:Warm & dry.  Intact without suspicious lesions or rashes ; no tenting or jaundice  Lymphatic: No lymphadenopathy is noted about the head, neck, axilla Neuro: Strength, tone & DTRs normal.        Assessment & Plan:  #1 tremor; she questions intention tremor; clinically I'm very concerned about Parkinson's  #2 dysphagia  #3 positional right upper quadrant pain which may be related to hiatal hernia  #4 hypertension, adequate control  #5 dyslipidemia  Plan: See orders recommendations

## 2015-02-07 NOTE — Assessment & Plan Note (Signed)
The most common cause of elevated triglycerides (TG) is the ingestion of sugar from high fructose corn syrup sources added to processed foods & drinks.  Eat a low-fat diet with lots of fruits and vegetables, up to 7-9 servings per day. Consume less than 30 Grams (preferably ZERO) of sugar per day from foods & drinks with High Fructose Corn Syrup (HFCS) sugar as #1,2,3 or # 4 on label.[employer], [employer] do not carry products with HFCS.

## 2015-02-07 NOTE — Patient Instructions (Signed)
  Your next office appointment will be determined based upon review of your pending labs  Those instructions will be transmitted to you through My Chart   Critical values will be called. Followup as needed for any active or acute issue. Please report any significant change in your symptoms.  Reflux of gastric acid may be asymptomatic as this may occur mainly during sleep.The triggers for reflux  include stress; the "aspirin family" ; alcohol; peppermint; and caffeine (coffee, tea, cola, and chocolate). The aspirin family would include aspirin and the nonsteroidal agents such as ibuprofen &  Naproxen. Tylenol would not cause reflux. If having symptoms ; food & drink should be avoided for @ least 2 hours before going to bed.   To decrease tremor, avoid stimulants such as decongestants, diet pills, nicotine, or caffeine (coffee, tea, cola, or chocolate) to excess.

## 2015-02-07 NOTE — Assessment & Plan Note (Signed)
BMET,Mg,TSH,free T4 [facility] referral; Dr Carles Collet

## 2015-02-07 NOTE — Assessment & Plan Note (Signed)
Anti reflux measures CBC & dif  LFTs GI referral

## 2015-02-07 NOTE — Progress Notes (Signed)
Pre visit review using our clinic review tool, if applicable. No additional management support is needed unless otherwise documented below in the visit note. 

## 2015-02-07 NOTE — Telephone Encounter (Signed)
Patient is requesting gabapentin, losartan, and metoprolol to be sent to [facility] on [address] in [place].

## 2015-02-08 ENCOUNTER — Telehealth: Payer: Self-pay | Admitting: Internal Medicine

## 2015-02-08 ENCOUNTER — Encounter: Payer: Self-pay | Admitting: Internal Medicine

## 2015-02-08 NOTE — Telephone Encounter (Signed)
emmi emailed °

## 2015-02-09 ENCOUNTER — Other Ambulatory Visit (INDEPENDENT_AMBULATORY_CARE_PROVIDER_SITE_OTHER): Payer: Medicare Other

## 2015-02-09 DIAGNOSIS — E782 Mixed hyperlipidemia: Secondary | ICD-10-CM

## 2015-02-09 DIAGNOSIS — I10 Essential (primary) hypertension: Secondary | ICD-10-CM

## 2015-02-09 DIAGNOSIS — R1314 Dysphagia, pharyngoesophageal phase: Secondary | ICD-10-CM

## 2015-02-09 DIAGNOSIS — R251 Tremor, unspecified: Secondary | ICD-10-CM

## 2015-02-09 LAB — CBC WITH DIFFERENTIAL/PLATELET
Basophils Absolute: 0 10*3/uL (ref 0.0–0.1)
Basophils Relative: 0.8 % (ref 0.0–3.0)
EOS ABS: 0.3 10*3/uL (ref 0.0–0.7)
Eosinophils Relative: 6 % — ABNORMAL HIGH (ref 0.0–5.0)
HEMATOCRIT: 40.1 % (ref 36.0–46.0)
Hemoglobin: 13.5 g/dL (ref 12.0–15.0)
LYMPHS ABS: 0.9 10*3/uL (ref 0.7–4.0)
Lymphocytes Relative: 18.9 % (ref 12.0–46.0)
MCHC: 33.7 g/dL (ref 30.0–36.0)
MCV: 91.5 fl (ref 78.0–100.0)
MONO ABS: 0.5 10*3/uL (ref 0.1–1.0)
Monocytes Relative: 9.3 % (ref 3.0–12.0)
NEUTROS PCT: 65 % (ref 43.0–77.0)
Neutro Abs: 3.2 10*3/uL (ref 1.4–7.7)
PLATELETS: 223 10*3/uL (ref 150.0–400.0)
RBC: 4.38 Mil/uL (ref 3.87–5.11)
RDW: 14.1 % (ref 11.5–15.5)
WBC: 4.9 10*3/uL (ref 4.0–10.5)

## 2015-02-09 LAB — HEPATIC FUNCTION PANEL
ALT: 15 U/L (ref 0–35)
AST: 19 U/L (ref 0–37)
Albumin: 4.2 g/dL (ref 3.5–5.2)
Alkaline Phosphatase: 69 U/L (ref 39–117)
Bilirubin, Direct: 0.1 mg/dL (ref 0.0–0.3)
TOTAL PROTEIN: 7.1 g/dL (ref 6.0–8.3)
Total Bilirubin: 1.1 mg/dL (ref 0.2–1.2)

## 2015-02-09 LAB — T4, FREE: Free T4: 0.93 ng/dL (ref 0.60–1.60)

## 2015-02-09 LAB — BASIC METABOLIC PANEL
BUN: 21 mg/dL (ref 6–23)
CO2: 29 mEq/L (ref 19–32)
Calcium: 9.7 mg/dL (ref 8.4–10.5)
Chloride: 103 mEq/L (ref 96–112)
Creatinine, Ser: 0.86 mg/dL (ref 0.40–1.20)
GFR: 68.96 mL/min (ref >60.00)
Glucose, Bld: 92 mg/dL (ref 70–99)
Potassium: 4.2 mEq/L (ref 3.5–5.1)
SODIUM: 138 meq/L (ref 135–145)

## 2015-02-09 LAB — LIPID PANEL
CHOLESTEROL: 209 mg/dL — AB (ref 0–200)
HDL: 63.6 mg/dL (ref >39.00)
LDL Cholesterol: 123 mg/dL — ABNORMAL HIGH (ref 0–99)
NonHDL: 145.4
TRIGLYCERIDES: 110 mg/dL (ref 0.0–149.0)
Total CHOL/HDL Ratio: 3
VLDL: 22 mg/dL (ref 0.0–40.0)

## 2015-02-09 LAB — TSH: TSH: 2.64 u[IU]/mL (ref 0.35–4.50)

## 2015-02-09 LAB — MAGNESIUM: Magnesium: 2.2 mg/dL (ref 1.5–2.5)

## 2015-02-10 ENCOUNTER — Other Ambulatory Visit: Payer: Self-pay | Admitting: *Deleted

## 2015-02-10 ENCOUNTER — Ambulatory Visit (INDEPENDENT_AMBULATORY_CARE_PROVIDER_SITE_OTHER): Payer: Medicare Other | Admitting: Neurology

## 2015-02-10 ENCOUNTER — Encounter: Payer: Self-pay | Admitting: Neurology

## 2015-02-10 VITALS — BP 122/78 | HR 60 | Ht 62.0 in | Wt 157.0 lb

## 2015-02-10 DIAGNOSIS — G20 Parkinson's disease: Secondary | ICD-10-CM

## 2015-02-10 DIAGNOSIS — G43009 Migraine without aura, not intractable, without status migrainosus: Secondary | ICD-10-CM

## 2015-02-10 DIAGNOSIS — I10 Essential (primary) hypertension: Secondary | ICD-10-CM

## 2015-02-10 DIAGNOSIS — M542 Cervicalgia: Secondary | ICD-10-CM

## 2015-02-10 DIAGNOSIS — I719 Aortic aneurysm of unspecified site, without rupture: Secondary | ICD-10-CM

## 2015-02-10 DIAGNOSIS — G20A1 Parkinson's disease without dyskinesia, without mention of fluctuations: Secondary | ICD-10-CM

## 2015-02-10 HISTORY — DX: Parkinson's disease: G20

## 2015-02-10 HISTORY — DX: Parkinson's disease without dyskinesia, without mention of fluctuations: G20.A1

## 2015-02-10 MED ORDER — CARBIDOPA-LEVODOPA 25-100 MG PO TABS: 1.0000 | ORAL_TABLET | Freq: Three times a day (TID) | ORAL | Status: DC

## 2015-02-10 MED ORDER — DIAZEPAM 5 MG PO TABS: 5.0000 mg | ORAL_TABLET | Freq: Once | ORAL | Status: DC

## 2015-02-10 NOTE — Progress Notes (Signed)
Crystal Bass was seen today in the movement disorders clinic for neurologic consultation at the request of [name], MD.  The consultation is for the evaluation of tremor.  Pt states that it has been going on over a year.  It is mostly in the R hand and rarely in the L hand.  "It is almost constant but not quite."  She notices it when grasping the steering wheel.  Eating was troublesome but better now that d/c the symbicort.  Symbicort worsened tremor by 80%.  Paternal aunt with tremor.  Specific Symptoms:  Tremor: Yes.     Affected by caffeine:  No.  Affected by alcohol: doesn't drink alcohol  Affected by stress:  No.  Affected by fatigue:  Yes.    Spills soup if on spoon:  Only when she was taking symbicort  Spills glass of liquid if full:  No.  Affects ADL's (tying shoes, brushing teeth, etc):  No.  Any other specific sx's: Voice: more hypophonic than in the past Sleep: sleeps well  Vivid Dreams:  Yes.    Acting out dreams:  Rarely will scream out Wet Pillows: Yes.   Postural symptoms:  No.  Falls?  No. Bradykinesia symptoms: shuffling gait and slow movements Loss of smell:  Yes.   Loss of taste:  Yes.   Urinary Incontinence:  Yes.    (minimal, wears pantyliner for stress incontinence) Difficulty Swallowing:  Yes.   (rarely) Handwriting, micrographia: Yes.   Trouble with ADL's:  No.  Trouble buttoning clothing: No. Depression:  No. Memory changes:  Yes.  , minimal Hallucinations:  No.  visual distortions: Yes.   N/V:  No. Lightheaded:  Yes.    Syncope: No. Diplopia:  Yes.   (rarely and just needs to "refocus my eyes") Dyskinesia:  No.  Neuroimaging has  previously been performed.  It is not available for my review today.  It was done 6-10 years ago.  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  They are better than in the past but still has them several times a month.  She sees Dr. Sima Matas in [place] in [place].    PREVIOUS MEDICATIONS: none to  date  ALLERGIES:   Allergies  Allergen Reactions  . Sulfonamide Derivatives Anaphylaxis  . Topamax [Topiramate] Other (See Comments)    Metabolic acidosis   . Biaxin [Clarithromycin] Other (See Comments)    pericarditis  . Hydrocodone Other (See Comments)    "hyper and climbing the walls"  . Motrin [Ibuprofen] Other (See Comments)    headaches  . Symbicort [Budesonide-Formoterol Fumarate]     02/07/15 tremor  . Percocet [Oxycodone-Acetaminophen] Itching and Rash  . Tape Itching and Rash    Use paper tape only  . Tetracyclines & Related Other (See Comments)    "immediate yeast infection"    CURRENT MEDICATIONS:  Outpatient Encounter Prescriptions as of 02/10/2015  Medication Sig  . acetaminophen (TYLENOL) 325 MG tablet Take 1,000 mg by mouth 2 (two) times daily as needed.   [name] aspirin 81 MG tablet Take 81 mg by mouth daily.    . Calcium Carbonate-Vit D-Min (CALCIUM 600 + MINERALS) 600-200 MG-UNIT TABS Take 1 tablet by mouth 2 (two) times daily.  [name] gabapentin ([name]) 300 MG capsule TAKE 1 TO 3 CAPSULES BY MOUTH EVERY 8 HOURS AS NEEDED (Patient taking differently: 600 mg 2 (two) times daily. )  . losartan (COZAAR) 50 MG tablet TAKE 1 TABLET EVERY DAY  . metoprolol tartrate (LOPRESSOR) 25  MG tablet 1 by mouth twice a day  . Multiple Vitamin (MULTIVITAMIN) capsule Take 1 capsule by mouth daily.  . Vitamin D, Ergocalciferol, ([name]) 50000 UNITS CAPS capsule TAKE 1 CAPLET BY MOUTH EVERY WEEK  . zolendronic acid (ZOMETA) 4 MG/5ML injection Inject 5 mLs (4 mg total) into the vein every 6 (six) months.  [name] albuterol (PROAIR HFA) 108 (90 BASE) MCG/ACT inhaler Inhale 2 puffs into the lungs every 6 (six) hours as needed. (Patient not taking: Reported on 02/10/2015)  . budesonide-formoterol (SYMBICORT) 160-4.5 MCG/ACT inhaler Inhale 2 puffs into the lungs 2 (two) times daily. (Patient not taking: Reported on 02/10/2015)  . IMITREX 100 MG tablet Take 100 mg by mouth every 2 (two) hours as  needed.     PAST MEDICAL HISTORY:   Past Medical History  Diagnosis Date  . Pancreatitis     secondary to Cholelithiasis  . Breast cancer 2002    infiltrative ductal carcinoma   . Lesion of breast 1992    right, benign  . Post-thoracotomy pain syndrome   . Uterine fibroid   . Endometriosis   . Ovarian cancer 2004  . Aneurysm of aorta     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  . Osteoporosis, post-menopausal 03/14/2012  . Ductal carcinoma of breast, estrogen receptor positive, stage 1 09/16/2012  . Pseudomonas pneumonia 5/09  . COPD (chronic obstructive pulmonary disease) 9/08  . Mastalgia 7/95  . Lung disease     secondary to [name] infection  . Hypercalcemia 09/14/2013  . Vitamin D deficiency   . Aortic root aneurysm     PAST SURGICAL HISTORY:   Past Surgical History  Procedure Laterality Date  . Lung biopsy  2002    [name], Dr [name]  . Cardiac catheterization  2007    essentially negation for significant CAD  . Appendectomy  2004  . Cholecystectomy  2001  . 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 Breast lumpectomy  1992    benign  . Mastectomy modified radical  2002    oral chemotheraphy (tamoxifen then Armidex) no radiation, Dr.[name]  . Tonsillectomy  1946  . Breast biopsy  08/2004    right breast-benign  . Colonoscopy  2014    Dr [name] , due 2019    SOCIAL HISTORY:   History   Social History  . Marital Status: Widowed    Spouse Name: N/A  . Number of Children: 0  . Years of Education: N/A   Occupational History  . 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 Smoking status: Never Smoker   . Smokeless tobacco: Never Used  . Alcohol Use: No  . Drug Use: No  . Sexual Activity: No     Comment: TAH/BSO   Other Topics Concern  . Not on file   Social History Narrative   Caregiver for >70 yo mother in law living with pt    FAMILY HISTORY:    Family Status  Relation Status Death Age  . Father Deceased     lung cancer  . Sister Alive     colon/breast cancer  . Mother Deceased     emphysema  . Brother Alive     healthy    ROS:  A complete 10 system review of systems was obtained and was unremarkable apart from what is mentioned above.  PHYSICAL EXAMINATION:  VITALS:   Filed Vitals:   02/10/15 1018  BP: 122/78  Pulse: 60  Height: 5\' 2"  (1.575 m)  Weight: 157 lb (71.215 kg)    GEN:  The patient appears stated age and is in NAD. HEENT:  Normocephalic, atraumatic.  The mucous membranes are moist. The superficial temporal arteries are without ropiness or tenderness. CV:  RRR Lungs:  CTAB Neck/HEME:  There are no carotid bruits bilaterally.  Neurological examination:  Orientation: The patient is alert and oriented x3. Fund of knowledge is appropriate.  Recent and remote memory are intact.  Attention and concentration are normal.    Able to name objects and repeat phrases. Cranial nerves: There is good facial symmetry with the exception of the fact that when asked to raise the eyebrows, the right eyebrow raises further than the left.  There is mild facial hypomimia.  Pupils are equal round and reactive to light bilaterally. Fundoscopic exam reveals clear margins bilaterally. Extraocular muscles are intact. The visual fields are full to confrontational testing. The speech is fluent and clear. Soft palate rises symmetrically and there is no tongue deviation. Hearing is intact to conversational tone. Sensation: Sensation is intact to light and pinprick throughout (facial, trunk, extremities). Vibration is intact at the bilateral big toe. There is no extinction with double simultaneous stimulation. There is no sensory dermatomal level identified. Motor: Strength is 5/5 in the bilateral upper and lower extremities.   Shoulder shrug is equal and symmetric.  There is no pronator drift. Deep tendon reflexes: Deep tendon reflexes  are 3/4 at the bilateral biceps, triceps, brachioradialis, patella and achilles. Plantar responses are downgoing bilaterally.  Movement examination: Tone: There is moderate increased tone in the right upper extremity and mild increased tone in the left upper extremity.  Tone in the lower extremities was normal. Abnormal movements: There is a right upper extremity resting tremor. Coordination:  There is decremation with RAM's, seen with heel taps on the left and with finger taps on the right and with alternation of supination/pronation on the right. Gait and Station: The patient has no difficulty arising out of a deep-seated chair without the use of the hands. The patient's stride length is normal, but she does have camptocormia to the right with decreased arm swing bilaterally.  The patient has a negative pull test.      ASSESSMENT/PLAN:  1.   idiopathic Parkinson's disease.  The patient has tremor, bradykinesia, rigidity and mild postural instability.  Sx's have been present for at least one year by the patients account today.  She is Hoehn and Yahr stage 2-2.5.    -We discussed the diagnosis as well as pathophysiology of the disease.  We discussed treatment options as well as prognostic indicators.  Patient education was provided.  -Greater than 50% of the 60 minute visit was spent in counseling answering questions and talking about what to expect now as well as in the future.  We talked about medication options as well as potential future surgical options.  We talked about safety in the home.  -We decided to add carbidopa/levodopa 25/100.  1/2 tab tid x 1 wk, then 1/2 in am & noon & 1 at night for a week, then 1/2 in am &1 at noon &night for a week, then 1 po tid.  Risks, benefits, side effects and alternative therapies were discussed.  The opportunity to ask questions was given and they were answered to the best of my ability.  The patient expressed understanding and willingness  to follow the  outlined treatment protocols.  -I will refer the patient to the Parkinson's program [facility], for PT/OT and ST at [facility].  We talked about the importance of cardiovascular exercise in Parkinson's disease.  -referred to support group at [facility] where she lives.  -talked about value of safe CV exercise  -pt asked lots of questions and answered to best of my ability.  Offered her a second opinion and she declined. 2.  Mild dysphagia  -Pt states rarely a problem.  Will need to monitor and may need MBE in future 3.  Long hx migraine  -Has neurologist (Dr. Joretta Bachelor) at [facility] and I told her that if she wanted to see her for the PD, I certainly have no objection.  She doesn't but she wants me to write her Imitrex.  I told her that I am leary given her age and other medical problems (HTN, aortic aneurysm).  I am going to do an MRI brain (given hyperreflexia and PD) but if we see significant small vessel disease, then I told her we cannot use triptans.   4.  Neck pain/hyperreflexia  -MRI cervical spine 5.  F/u in next few months, sooner should new neuro issues arise.

## 2015-02-10 NOTE — Patient Instructions (Addendum)
1.  .start carbidopa/levodopa 25/100 as follows:  1/2 tab three times a day before meals x 1 wk, then 1/2 in am & noon & 1 in evening for a week, then 1/2 in am &1 at noon &one in evening for a week, then 1 tablet three times a day before meals 2.  [facility] invites all individuals and families living with Parkinson's to our monthly Parkinson's Support Group. Join [place] every first Thursday at 10:30. Feel free to bring a sandwich or bag lunch. [facility] will provide beverages. Please call Donald Siva at [phone] for information. [facility]  .02/28/15 11:45 am  Please do not take valium until your name is called at the facility you MUST have a driver  . Follow up in 3 months   PT [facility]

## 2015-02-23 ENCOUNTER — Ambulatory Visit
Admission: RE | Admit: 2015-02-23 | Discharge: 2015-02-23 | Disposition: A | Discharge status: Home / Self Care | Payer: Medicare Other | Source: Ambulatory Visit | Attending: Hematology | Admitting: Hematology

## 2015-02-23 DIAGNOSIS — A310 Pulmonary mycobacterial infection: Secondary | ICD-10-CM

## 2015-02-25 ENCOUNTER — Ambulatory Visit
Admission: RE | Admit: 2015-02-25 | Discharge: 2015-02-25 | Disposition: A | Discharge status: Home / Self Care | Payer: Medicare Other | Source: Ambulatory Visit | Attending: Hematology | Admitting: Hematology

## 2015-02-25 DIAGNOSIS — Z1231 Encounter for screening mammogram for malignant neoplasm of breast: Secondary | ICD-10-CM

## 2015-02-25 DIAGNOSIS — Z9012 Acquired absence of left breast and nipple: Secondary | ICD-10-CM

## 2015-02-28 ENCOUNTER — Ambulatory Visit (HOSPITAL_COMMUNITY)
Admission: RE | Admit: 2015-02-28 | Discharge: 2015-02-28 | Disposition: A | Discharge status: Home / Self Care | Payer: Medicare Other | Source: Ambulatory Visit | Attending: Neurology | Admitting: Neurology

## 2015-02-28 ENCOUNTER — Ambulatory Visit (HOSPITAL_COMMUNITY): Payer: Medicare Other

## 2015-02-28 DIAGNOSIS — G43009 Migraine without aura, not intractable, without status migrainosus: Secondary | ICD-10-CM | POA: Diagnosis not present

## 2015-02-28 DIAGNOSIS — I739 Peripheral vascular disease, unspecified: Secondary | ICD-10-CM | POA: Diagnosis not present

## 2015-02-28 DIAGNOSIS — G319 Degenerative disease of nervous system, unspecified: Secondary | ICD-10-CM | POA: Insufficient documentation

## 2015-02-28 DIAGNOSIS — M419 Scoliosis, unspecified: Secondary | ICD-10-CM | POA: Diagnosis not present

## 2015-02-28 DIAGNOSIS — M47812 Spondylosis without myelopathy or radiculopathy, cervical region: Secondary | ICD-10-CM | POA: Diagnosis not present

## 2015-02-28 DIAGNOSIS — M542 Cervicalgia: Secondary | ICD-10-CM

## 2015-02-28 DIAGNOSIS — G20 Parkinson's disease: Secondary | ICD-10-CM | POA: Diagnosis not present

## 2015-03-01 ENCOUNTER — Telehealth: Payer: Self-pay | Admitting: Neurology

## 2015-03-01 NOTE — Telephone Encounter (Signed)
Left message on machine for patient to call back.

## 2015-03-01 NOTE — Telephone Encounter (Signed)
Spoke with patient about results and answered multiple questions to the best of my ability. She will call back as needed and keep follow up appt.

## 2015-03-01 NOTE — Telephone Encounter (Signed)
-----   Message from [place], DO sent at 02/28/2015  4:11 PM EDT ----- Reviewed.  Mod small vessel disease.  Jade, let pt know that as suspected there was some "hardening" of arteries in brain and I do not recommend imitrex or any triptan

## 2015-03-06 ENCOUNTER — Other Ambulatory Visit: Payer: Self-pay | Admitting: Internal Medicine

## 2015-03-15 ENCOUNTER — Other Ambulatory Visit: Payer: Self-pay

## 2015-03-15 DIAGNOSIS — Z170 Estrogen receptor positive status [ER+]: Principal | ICD-10-CM

## 2015-03-15 DIAGNOSIS — C50919 Malignant neoplasm of unspecified site of unspecified female breast: Secondary | ICD-10-CM

## 2015-03-16 ENCOUNTER — Ambulatory Visit (HOSPITAL_BASED_OUTPATIENT_CLINIC_OR_DEPARTMENT_OTHER): Payer: Medicare Other

## 2015-03-16 ENCOUNTER — Other Ambulatory Visit (HOSPITAL_BASED_OUTPATIENT_CLINIC_OR_DEPARTMENT_OTHER): Payer: Medicare Other

## 2015-03-16 ENCOUNTER — Telehealth: Payer: Self-pay | Admitting: Hematology and Oncology

## 2015-03-16 ENCOUNTER — Ambulatory Visit (HOSPITAL_BASED_OUTPATIENT_CLINIC_OR_DEPARTMENT_OTHER): Payer: Medicare Other | Admitting: Hematology and Oncology

## 2015-03-16 VITALS — BP 158/92 | HR 60 | Temp 97.9°F | Resp 18 | Ht 62.0 in | Wt 153.5 lb

## 2015-03-16 DIAGNOSIS — Z8543 Personal history of malignant neoplasm of ovary: Secondary | ICD-10-CM

## 2015-03-16 DIAGNOSIS — C50919 Malignant neoplasm of unspecified site of unspecified female breast: Secondary | ICD-10-CM

## 2015-03-16 DIAGNOSIS — Z853 Personal history of malignant neoplasm of breast: Secondary | ICD-10-CM

## 2015-03-16 DIAGNOSIS — A310 Pulmonary mycobacterial infection: Secondary | ICD-10-CM

## 2015-03-16 DIAGNOSIS — Z170 Estrogen receptor positive status [ER+]: Principal | ICD-10-CM

## 2015-03-16 DIAGNOSIS — M810 Age-related osteoporosis without current pathological fracture: Secondary | ICD-10-CM

## 2015-03-16 DIAGNOSIS — R17 Unspecified jaundice: Secondary | ICD-10-CM

## 2015-03-16 DIAGNOSIS — G20 Parkinson's disease: Secondary | ICD-10-CM

## 2015-03-16 DIAGNOSIS — C50912 Malignant neoplasm of unspecified site of left female breast: Secondary | ICD-10-CM

## 2015-03-16 DIAGNOSIS — R109 Unspecified abdominal pain: Secondary | ICD-10-CM | POA: Diagnosis not present

## 2015-03-16 LAB — COMPREHENSIVE METABOLIC PANEL (CC13)
ALBUMIN: 3.7 g/dL (ref 3.5–5.0)
ALT: <6 U/L (ref 0–55)
AST: 16 U/L (ref 5–34)
Alkaline Phosphatase: 66 U/L (ref 40–150)
Anion Gap: 15 mEq/L — ABNORMAL HIGH (ref 3–11)
BUN: 10.1 mg/dL (ref 7.0–26.0)
CALCIUM: 9.2 mg/dL (ref 8.4–10.4)
CHLORIDE: 104 meq/L (ref 98–109)
CO2: 22 mEq/L (ref 22–29)
Creatinine: 0.7 mg/dL (ref 0.6–1.1)
EGFR: 85 mL/min/{1.73_m2} — AB (ref >90)
Glucose: 80 mg/dl (ref 70–140)
Potassium: 4 mEq/L (ref 3.5–5.1)
Sodium: 142 mEq/L (ref 136–145)
Total Bilirubin: 1.45 mg/dL — ABNORMAL HIGH (ref 0.20–1.20)
Total Protein: 6.9 g/dL (ref 6.4–8.3)

## 2015-03-16 LAB — CBC WITH DIFFERENTIAL/PLATELET
BASO%: 0.7 % (ref 0.0–2.0)
BASOS ABS: 0 10*3/uL (ref 0.0–0.1)
EOS%: 4.7 % (ref 0.0–7.0)
Eosinophils Absolute: 0.3 10*3/uL (ref 0.0–0.5)
HEMATOCRIT: 42 % (ref 34.8–46.6)
HEMOGLOBIN: 13.4 g/dL (ref 11.6–15.9)
LYMPH#: 1.3 10*3/uL (ref 0.9–3.3)
LYMPH%: 22.9 % (ref 14.0–49.7)
MCH: 30.5 pg (ref 25.1–34.0)
MCHC: 31.9 g/dL (ref 31.5–36.0)
MCV: 95.7 fL (ref 79.5–101.0)
MONO#: 0.7 10*3/uL (ref 0.1–0.9)
MONO%: 12.4 % (ref 0.0–14.0)
NEUT#: 3.4 10*3/uL (ref 1.5–6.5)
NEUT%: 59.3 % (ref 38.4–76.8)
PLATELETS: 192 10*3/uL (ref 145–400)
RBC: 4.39 10*6/uL (ref 3.70–5.45)
RDW: 13.3 % (ref 11.2–14.5)
WBC: 5.7 10*3/uL (ref 3.9–10.3)

## 2015-03-16 MED ORDER — SODIUM CHLORIDE 0.9 % IV SOLN
INTRAVENOUS | Status: DC
  Administered 2015-03-16: 13:00:00 via INTRAVENOUS

## 2015-03-16 MED ORDER — ZOLEDRONIC ACID 4 MG/100ML IV SOLN
4.0000 mg | Freq: Once | INTRAVENOUS | Status: AC
  Administered 2015-03-16: 4 mg via INTRAVENOUS
  Filled 2015-03-16: qty 100

## 2015-03-16 NOTE — Progress Notes (Signed)
Patient Care Team: Hendricks Limes, MD as PCP - General (Internal Medicine) Hendricks Limes, MD as Referring Physician (Internal Medicine) Annia Belt, MD as Consulting Physician (Oncology) Michel Bickers, MD as Consulting Physician (Infectious Diseases)  DIAGNOSIS: No matching staging information was found for the patient.  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 Chase Caller   04/15/2003 Surgery Hysterectomy plus oophorectomy for low-grade ovarian cancer    CHIEF COMPLIANT: Follow-up of breast cancer and is on Zometa  INTERVAL HISTORY: Crystal Bass is a 74 year old lady with above-mentioned history of left breast cancer treated with mastectomy followed by antiestrogen therapy with tamoxifen later changed to aromatase inhibitor. She also has a diagnosis of atypical Mycobacterium infection for which she was being watched and monitored by Dr. Chase Caller. She is here for routine follow-up reports no new problems or concerns. She complains of left flank pain. It is not associated with hematuria. She was also diagnosed with Parkinson's disease.  REVIEW OF SYSTEMS:   Constitutional: Denies fevers, chills or abnormal weight loss Eyes: Denies blurriness of vision Ears, nose, mouth, throat, and face: Denies mucositis or sore throat Respiratory: Denies cough, dyspnea or wheezes Cardiovascular: Denies palpitation, chest discomfort or lower extremity swelling Gastrointestinal:  Denies nausea, heartburn or change in bowel habits Skin: Denies abnormal skin rashes Lymphatics: Denies new lymphadenopathy or easy bruising Neurological: Parkinson's disease tremors Behavioral/Psych: Mood is stable, no new  changes  Breast:  denies any pain or lumps or nodules in either breasts All other systems were reviewed with the patient and are negative.  I have reviewed the past medical history, past surgical history, social history and family history with the patient and they are unchanged from previous note.  ALLERGIES:  is allergic to sulfonamide derivatives; topamax; biaxin; hydrocodone; motrin; symbicort; percocet; tape; and tetracyclines & related.  MEDICATIONS:  Current Outpatient Prescriptions  Medication Sig Dispense Refill  . acetaminophen (TYLENOL) 325 MG tablet Take 1,000 mg by mouth 2 (two) times daily as needed.     [name] albuterol (PROAIR HFA) 108 (90 BASE) MCG/ACT inhaler Inhale 2 puffs into the lungs every 6 (six) hours as needed. 1 Inhaler 3  . aspirin 81 MG tablet Take 81 mg by mouth daily.      . budesonide-formoterol (SYMBICORT) 160-4.5 MCG/ACT inhaler Inhale 2 puffs into the lungs 2 (two) times daily. 1 Inhaler 0  . Calcium Carbonate-Vit D-Min (CALCIUM 600 + MINERALS) 600-200 MG-UNIT TABS Take 1 tablet by mouth 2 (two) times daily.    . carbidopa-levodopa ([name]) 25-100 MG per tablet Take 1 tablet by mouth 3 (three) times daily. 90 tablet 3  . diazepam (VALIUM) 5 MG tablet Take 1 tablet (5 mg total) by mouth once. Take after arriving at facility MUST have a driver 1 tablet 0  . gabapentin ([name]) 300 MG capsule TAKE 1 TO 3 CAPSULES BY MOUTH EVERY 8 HOURS AS NEEDED 270 capsule 0  . IMITREX 100 MG tablet Take 100 mg by mouth every 2 (two) hours as needed.     [name] losartan (COZAAR) 50 MG tablet TAKE 1 TABLET EVERY DAY 90 tablet 3  . metoprolol tartrate (LOPRESSOR) 25 MG tablet 1 by mouth twice a day 180 tablet 3  . Multiple Vitamin (  MULTIVITAMIN) capsule Take 1 capsule by mouth daily.    . Vitamin D, Ergocalciferol, ([name]) 50000 UNITS CAPS capsule TAKE 1 CAPLET BY MOUTH EVERY WEEK 26 capsule 1  . zolendronic acid (ZOMETA) 4 MG/5ML injection Inject 5 mLs (4 mg total) into the vein  every 6 (six) months. 5 mL 0   No current facility-administered medications for this visit.    PHYSICAL EXAMINATION: ECOG PERFORMANCE STATUS: 1 - Symptomatic but completely ambulatory  Filed Vitals:   03/16/15 1033  BP: 158/92  Pulse: 60  Temp: 97.9 F (36.6 C)  Resp: 18   Filed Weights   03/16/15 1033  Weight: 153 lb 8 oz (69.627 kg)    GENERAL:alert, no distress and comfortable SKIN: skin color, texture, turgor are normal, no rashes or significant lesions EYES: normal, Conjunctiva are pink and non-injected, sclera clear OROPHARYNX:no exudate, no erythema and lips, buccal mucosa, and tongue normal  NECK: supple, thyroid normal size, non-tender, without nodularity LYMPH:  no palpable lymphadenopathy in the cervical, axillary or inguinal LUNGS: clear to auscultation and percussion with normal breathing effort HEART: regular rate & rhythm and no murmurs and no lower extremity edema ABDOMEN:abdomen soft, non-tender and normal bowel sounds Musculoskeletal:no cyanosis of digits and no clubbing  NEURO: alert & oriented x 3 with fluent speech, no focal motor/sensory deficits BREAST: No palpable masses or nodules . No palpable axillary supraclavicular or infraclavicular adenopathy no breast tenderness or nipple discharge. (exam performed in the presence of a chaperone)  LABORATORY DATA:  I have reviewed the data as listed   Chemistry      Component Value Date/Time   NA 142 03/16/2015 0947   NA 138 02/09/2015 0853   K 4.0 03/16/2015 0947   K 4.2 02/09/2015 0853   CL 103 02/09/2015 0853   CL 102 03/10/2013 1529   CO2 22 03/16/2015 0947   CO2 29 02/09/2015 0853   BUN 10.1 03/16/2015 0947   BUN 21 02/09/2015 0853   CREATININE 0.7 03/16/2015 0947   CREATININE 0.86 02/09/2015 0853      Component Value Date/Time   CALCIUM 9.2 03/16/2015 0947   CALCIUM 9.7 02/09/2015 0853   ALKPHOS 66 03/16/2015 0947   ALKPHOS 69 02/09/2015 0853   AST 16 03/16/2015 0947   AST 19 02/09/2015  0853   ALT <6 03/16/2015 0947   ALT 15 02/09/2015 0853   BILITOT 1.45* 03/16/2015 0947   BILITOT 1.1 02/09/2015 0853       Lab Results  Component Value Date   WBC 5.7 03/16/2015   HGB 13.4 03/16/2015   HCT 42.0 03/16/2015   MCV 95.7 03/16/2015   PLT 192 03/16/2015   NEUTROABS 3.4 03/16/2015    ASSESSMENT & PLAN:  Breast cancer, left breast Left breast stage I invasive ductal carcinoma ER positive treated with mastectomy followed by adjuvant antiestrogen therapy from 2002 to 2012. Currently on observation Pulmonary Mycobacterium avium tuberculosis being managed and treated by Dr. Chase Caller Low-grade ovarian cancer treated with hysterectomy and oophorectomy May 2004  Breast cancer surveillance: 1. Breast exam right breast 03/16/2015 is normal 2. Mammogram right breast 02/25/2015 is normal Parkinson's disease diagnosis Right flank pain: Could be related to the nonobstructing kidney stone versus musculoskeletal in nature. We will observe this. I recommended transitioning her from our clinic to survivorship clinic in follow-up in one year Continue with every six-month Zometa injections with labs follow-up. Slightly elevated bilirubin: I discussed that she should discuss it with her family physician. This is the first time  it has ever been elevated. It could be monitored.   No orders of the defined types were placed in this encounter.   The patient has a good understanding of the overall plan. she agrees with it. She will call with any problems that may develop before her next visit here.   Rulon Eisenmenger, MD

## 2015-03-16 NOTE — Assessment & Plan Note (Signed)
Left breast stage I invasive ductal carcinoma ER positive treated with mastectomy followed by adjuvant antiestrogen therapy from 2002 to 2012. Currently on observation Pulmonary Mycobacterium avium tuberculosis being managed and treated by Dr. Chase Caller Low-grade ovarian cancer treated with hysterectomy and oophorectomy May 2004  Breast cancer surveillance: 1. Breast exam right breast 03/16/2015 is normal 2. Mammogram right breast 02/25/2015 is normal  I recommended transitioning her from our clinic to survivorship clinic in follow-up in one year

## 2015-03-16 NOTE — Patient Instructions (Signed)

## 2015-03-16 NOTE — Telephone Encounter (Signed)
per pof to sch pt appt-sent MW email to sch zometa-pt aware of appts-stated has MY CHART and will review

## 2015-03-28 ENCOUNTER — Encounter: Payer: Self-pay | Admitting: Internal Medicine

## 2015-03-28 ENCOUNTER — Ambulatory Visit (INDEPENDENT_AMBULATORY_CARE_PROVIDER_SITE_OTHER): Payer: Medicare Other | Admitting: Internal Medicine

## 2015-03-28 VITALS — BP 120/84 | HR 60 | Ht 62.5 in | Wt 154.0 lb

## 2015-03-28 DIAGNOSIS — K219 Gastro-esophageal reflux disease without esophagitis: Secondary | ICD-10-CM

## 2015-03-28 DIAGNOSIS — R1310 Dysphagia, unspecified: Secondary | ICD-10-CM

## 2015-03-28 NOTE — Progress Notes (Signed)
HISTORY OF PRESENT ILLNESS:  Crystal Bass is a 74 y.o. female with past medical history as listed below. I have seen the patient previously for routine colon cancer screening given family history of colon cancer in her sister around age 76. Previous colonoscopies in 2007 and most recently April 2014 were negative for neoplasia. She presents today, upon referral from Dr. Linna Darner, with a new complaint of intermittent solid food dysphagia. This has been going on for some time. Items such as meat or most problematic. She denies any significant reflux symptoms. There has been no weight loss. She does not take reflux medication. Laboratories from April 2016 have been reviewed and found to be unremarkable including normal CBC.  REVIEW OF SYSTEMS:  All non-GI ROS negative except for sinus allergy, headaches, back pain, arthritis  Past Medical History  Diagnosis Date  . Pancreatitis     secondary to Cholelithiasis  . Breast cancer 2002    infiltrative ductal carcinoma   . Lesion of breast 1992    right, benign  . Post-thoracotomy pain syndrome   . Uterine fibroid   . Endometriosis   . Ovarian cancer 2004  . Aneurysm of aorta     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  . Osteoporosis, post-menopausal 03/14/2012  . Ductal carcinoma of breast, estrogen receptor positive, stage 1 09/16/2012  . Pseudomonas pneumonia 5/09  . COPD (chronic obstructive pulmonary disease) 9/08  . Mastalgia 7/95  . Lung disease     secondary to [name] infection  . Hypercalcemia 09/14/2013  . Vitamin D deficiency   . Aortic root aneurysm     Past Surgical History  Procedure Laterality Date  . Lung biopsy  2002    [name], Dr [name]  . Cardiac catheterization  2007    essentially negation for significant CAD  . Appendectomy  2004  . Cholecystectomy  2001  . 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 Breast lumpectomy  1992    benign  . Mastectomy modified radical  2002     oral chemotheraphy (tamoxifen then Armidex) no radiation, Dr.[name]  . Tonsillectomy  1946  . Breast biopsy  08/2004    right breast-benign  . Colonoscopy  2014    Dr [name] , due 2019    Social History Crystal Bass  reports that she has never smoked. She has never used smokeless tobacco. She reports that she does not drink alcohol or use illicit drugs.  family history includes Breast cancer (age of onset: 43) in her sister; Colon cancer in her sister; Emphysema in her mother; Lung cancer in her father; Myasthenia gravis in her paternal aunt; Osteoporosis in her paternal aunt; Stroke in her paternal aunt. There is no history of Diabetes or Heart disease.  Allergies  Allergen Reactions  . Sulfonamide Derivatives Anaphylaxis  . Topamax [Topiramate] Other (See Comments)    Metabolic acidosis   . Biaxin [Clarithromycin] Other (See Comments)    pericarditis  . Hydrocodone Other (See Comments)    "hyper and climbing the walls"  . Motrin [Ibuprofen] Other (See Comments)    headaches  . Symbicort [Budesonide-Formoterol Fumarate]     02/07/15 tremor  . Percocet [Oxycodone-Acetaminophen] Itching and Rash  . Tape Itching and Rash    Use paper tape only  . Tetracyclines & Related Other (See Comments)    "immediate yeast infection"       PHYSICAL EXAMINATION: Vital signs: BP  120/84 mmHg  Pulse 60  Ht 5' 2.5" (1.588 m)  Wt 154 lb (69.854 kg)  BMI 27.70 kg/m2 General: Well-developed, well-nourished, no acute distress HEENT: Sclerae are anicteric, conjunctiva pink. Oral mucosa intact Lungs: Clear Heart: Regular Abdomen: soft, nontender, nondistended, no obvious ascites, no peritoneal signs, normal bowel sounds. No organomegaly. Extremities: No edema Psychiatric: alert and oriented x3. Cooperative  ASSESSMENT:  #1. Intermittent solid food dysphagia. Rule out stricture #2. Family history of colon cancer in her sister at age 72. Last examination 2014 was  negative  PLAN:  #1. Upper endoscopy with probable esophageal dilation.The nature of the procedure, as well as the risks, benefits, and alternatives were carefully and thoroughly reviewed with the patient. Ample time for discussion and questions allowed. The patient understood, was satisfied, and agreed to proceed. #2. Screening colonoscopy around 2019  A copy of this consultation at has been sent to Dr. Linna Darner

## 2015-03-28 NOTE — Patient Instructions (Signed)
You have been scheduled for an endoscopy. Please follow written instructions given to you at your visit today. If you use inhalers (even only as needed), please bring them with you on the day of your procedure.   

## 2015-04-19 ENCOUNTER — Ambulatory Visit: Payer: Medicare Other | Attending: Neurology | Admitting: Speech Pathology

## 2015-04-19 ENCOUNTER — Encounter: Payer: Self-pay | Admitting: Speech Pathology

## 2015-04-19 ENCOUNTER — Encounter: Payer: Self-pay | Admitting: Physical Therapy

## 2015-04-19 ENCOUNTER — Ambulatory Visit: Payer: Medicare Other | Admitting: Physical Therapy

## 2015-04-19 DIAGNOSIS — G20 Parkinson's disease: Secondary | ICD-10-CM | POA: Diagnosis present

## 2015-04-19 DIAGNOSIS — R262 Difficulty in walking, not elsewhere classified: Secondary | ICD-10-CM | POA: Diagnosis present

## 2015-04-19 DIAGNOSIS — R490 Dysphonia: Secondary | ICD-10-CM | POA: Diagnosis present

## 2015-04-19 NOTE — Therapy (Signed)
[place] MAIN [facility] SERVICES [address] [place] [address] [place], [place], [address] Phone: [phone]   Fax:  [phone]  Physical Therapy Evaluation  Patient Details  Name: Crystal Bass MRN: [id number] Date of Birth: 1941/05/12 Referring Provider:  Terald Sleeper, NP  Encounter Date: 04/19/2015    Past Medical History  Diagnosis Date  . Pancreatitis     secondary to Cholelithiasis  . Breast cancer 2002    infiltrative ductal carcinoma   . Lesion of breast 1992    right, benign  . Post-thoracotomy pain syndrome   . Uterine fibroid   . Endometriosis   . Ovarian cancer 2004  . Aneurysm of aorta     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  . Osteoporosis, post-menopausal 03/14/2012  . Ductal carcinoma of breast, estrogen receptor positive, stage 1 09/16/2012  . Pseudomonas pneumonia 5/09  . COPD (chronic obstructive pulmonary disease) 9/08  . Mastalgia 7/95  . Lung disease     secondary to [name] infection  . Hypercalcemia 09/14/2013  . Vitamin D deficiency   . Aortic root aneurysm     Past Surgical History  Procedure Laterality Date  . Lung biopsy  2002    [name], Dr [name]  . Cardiac catheterization  2007    essentially negation for significant CAD  . Appendectomy  2004  . Cholecystectomy  2001  . 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 Breast lumpectomy  1992    benign  . Mastectomy modified radical  2002    oral chemotheraphy (tamoxifen then Armidex) no radiation, Dr.[name]  . Tonsillectomy  1946  . Breast biopsy  08/2004    right breast-benign  . Colonoscopy  2014    Dr [name] , due 2019    There were no vitals filed for this visit.  Visit Diagnosis:  Difficulty walking      Subjective Assessment - 04/19/15 1500    Subjective Patient is having a tremmer and waking up in the night screaming.             [facility] PT Assessment - 04/19/15 0001    Assessment   Medical  Diagnosis parkinsons march 2016   Onset Date/Surgical Date 02/10/15   Hand Dominance Right   Next MD Visit May 16 2015   Prior Therapy no   Precautions   Precautions Other (comment)  no   Restrictions   Weight Bearing Restrictions No   Balance Screen   Has the patient fallen in the past 6 months No   Has the patient had a decrease in activity level because of a fear of falling?  No   Is the patient reluctant to leave their home because of a fear of falling?  No   Home Environment   Living Environment Private residence   Living Arrangements Alone   Available Help at Discharge Available PRN/intermittently   Type of Picture [place] One level   Home Equipment Shower seat   Prior Function   Level of Independence Independent   Observation/Other Assessments   Other Surveys  Other Surveys  5 x sit to stand        10 MW     TUG n           TUG carry    Sensation   Light Touch --  intact to light and deep BUE and BLE   Coordination  Gross Motor Movements are Fluid and Coordinated Yes   Finger Nose Finger Test WNL   Heel Shin Test WNL   Functional Tests   Functional tests Sit to Stand   Tone   Assessment Location Right Upper Extremity   ROM / Strength   AROM / PROM / Strength AROM   AROM   Overall AROM  Other (comment)  mild resting tremor   Transfers   Transfers Sit to Stand   Sit to Stand --   Ambulation/Gait   Gait Comments --  5 x sit to stand        10 MW     TUG n           TUG carry          5 x sit to stand 9.47        10 MW  1.62 m/sec    TUG n    5.58        TUG carry6.13     TUG cognitive   6.10     6 MW    1250 feet   Peg left 21.66     Peg right17.18    Berg  54/56       DGI    24/24                     PT Education - 04/19/15 1504    Education provided Yes   Person(s) Educated Patient   Methods Explanation   Comprehension Verbalized understanding             PT Long Term Goals - 04/19/15 1557    PT LONG  TERM GOAL #1   Title Patient will be able to stand with unilateral upper extremity support for >  minutes in order to independently perform ADL's.   Status New   PT LONG TERM GOAL #2   Title Patient will be able to reach > 12  inches without loss of balance to reduce fall risk with functional activities.   Status New   PT LONG TERM GOAL #3   Title Pt will increase 6 minute walk time by 150 feet to improve endurance and aid in improving mobility   Status New   PT LONG TERM GOAL #4   Title Patient will be independent with home exercise program for self-management of ankle symptoms/ difficulty.   Status New               Plan - 04/19/15 1552    Clinical Impression Statement Patient ambulates without a  device 1200 feet  with slow gait speed and gait deficits of decreased arm swing on RUE. This   yr old patient has dx of parkinson disease. The patient has the following deficits:  Patient has decreased outcome measures and will benefit from skilled PT for LSVT BIG to increase amplitude of limb and body movement, speed, balance and quality of life         Problem List Patient Active Problem List   Diagnosis Date Noted  . Tremor 02/07/2015  . Dysphagia, pharyngoesophageal phase 02/07/2015  . Hypercalcemia 09/14/2013  . Unspecified vitamin D deficiency 04/27/2013  . Breast cancer, left breast 09/16/2012  . Osteoporosis, post-menopausal 03/14/2012  . Dyspnea 03/28/2011  . Cough 03/13/2011  . ATYPICAL MYCOBACTERIAL INFECTION 01/01/2011  . AORTIC ANEURYSM 01/01/2011  . Headache(784.0) 01/01/2011  . OSTEOARTHRITIS, CERVICAL SPINE 08/15/2009  . HYPERGLYCEMIA, FASTING 12/06/2008  . ALLERGIC RHINITIS 03/25/2008  . CHRONIC  OBSTRUCTIVE ASTHMA UNSPECIFIED 03/25/2008  . BREAST CANCER, HX OF 03/10/2008  . Mixed hyperlipidemia 02/10/2007  . Essential hypertension 02/10/2007  Alanson Puls, PT, DPT  [place], Minette Headland S 04/19/2015, 3:59 PM  [place] MAIN [facility] SERVICES [address] [place], [place], [address] Phone: [phone]   Fax:  [phone]

## 2015-04-19 NOTE — Therapy (Signed)
[place] MAIN [facility] SERVICES [address] [place], [place], [address] Phone: [phone]   Fax:  [phone]  Speech Language Pathology Evaluation  Patient Details  Name: Crystal Bass MRN: [id number] Date of Birth: 07/30/1941 Referring Provider:  Ludwig Clarks, DO  Encounter Date: 04/19/2015      End of Session - 04/19/15 1629    Visit Number 1   Number of Visits 17   Date for SLP Re-Evaluation 05/25/15   SLP Start Time 1350   SLP Stop Time  6962   SLP Time Calculation (min) 55 min   Activity Tolerance Patient tolerated treatment well      Past Medical History  Diagnosis Date   Pancreatitis     secondary to Cholelithiasis   Breast cancer 2002    infiltrative ductal carcinoma    Lesion of breast 1992    right, benign   Post-thoracotomy pain syndrome    Uterine fibroid    Endometriosis    Ovarian cancer 2004   Aneurysm of aorta     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   Osteoporosis, post-menopausal 03/14/2012   Ductal carcinoma of breast, estrogen receptor positive, stage 1 09/16/2012   Pseudomonas pneumonia 5/09   COPD (chronic obstructive pulmonary disease) 9/08   Mastalgia 7/95   Lung disease     secondary to [name] infection   Hypercalcemia 09/14/2013   Vitamin D deficiency    Aortic root aneurysm     Past Surgical History  Procedure Laterality Date   Lung biopsy  2002    [name], Dr [name]   Cardiac catheterization  2007    essentially negation for significant CAD   Appendectomy  2004   Cholecystectomy  2001   Abdominal hysterectomy      & BSO for Mucinous borderline tumor of R ovary 2004   Breast lumpectomy  1992    benign   Mastectomy modified radical  2002    oral chemotheraphy (tamoxifen then Armidex) no radiation, Dr.[name]   Tonsillectomy  1946   Breast biopsy  08/2004    right breast-benign   Colonoscopy  2014    Dr [name] , due 2019    There were no vitals filed  for this visit.  Visit Diagnosis: Dysphonia - Plan: SLP plan of care cert/re-cert  Parkinson disease - Plan: SLP plan of care cert/re-cert      Subjective Assessment - 04/19/15 1624    Subjective Patient was diagnosed with Parkinson's 02/10/2015.  She does not feel that her communication is compromised at this point.   Currently in Pain? No/denies            SLP Evaluation [facility] - 04/19/15 1624    SLP Visit Information   SLP Received On 04/19/15   Onset Date 02/10/2015   Prior Functional Status   Cognitive/Linguistic Baseline Within functional limits   Cognition   Overall Cognitive Status Within Functional Limits for tasks assessed   Auditory Comprehension   Overall Auditory Comprehension Appears within functional limits for tasks assessed   Reading Comprehension   Reading Status Within funtional limits   Verbal Expression   Overall Verbal Expression Appears within functional limits for tasks assessed   Oral Motor/Sensory Function   Overall Oral Motor/Sensory Function Appears within functional limits for tasks assessed   Motor Speech   Overall Motor Speech Impaired   Phonation Hoarse;Low vocal intensity   Resonance Within functional limits   Articulation Within functional limitis  Intelligibility Intelligible   Motor Planning Witnin functional limits   Motor Speech Errors Not applicable   Phonation Impaired   Vocal Abuses Habitual Hyperphonia;Glottal Attack   Volume Soft   Pitch --  monotone   Standardized Assessments   Standardized Assessments  Other Assessment  Perceptual Voice Evaluation      Perceptual Voice Evaluation Maximum phonation time for sustained ah: 10 seconds  Mean intensity during sustained ah: 60 dB  Mean intensity during sustained during conversational speech: 58 dB  Average fundamental frequency during sustained ah: 171 Hz  Average time patient was able to sustain /s/: 5 seconds  Average time patient was able to sustain /z/: 5  seconds  s/z ratio : 1  Highest dynamic pitch when altering pitch from a low note to a high note: 512 Hz  Highest pitch during conversational speech: 731 Hz  Lowest dynamic pitch when altering from a high note to a low note: 113 Hz  Lowest pitch during conversational speech: 133 Hz  Visi-Pitch: Multi-Dimensional Voice Program (MDVP)  MDVP extracts objective quantitative values (Relative Average Perturbation, Shimmer, Voice Turbulence Index, and Noise to Harmonic Ratio) on sustained phonation, which are displayed graphically and numerically in comparison to a built-in normative database.  The patient exhibited values outside the norm for Shimmer.  Average fundamental frequency was 2.6 STD below the average for age and gender. The patient improved all parameters when cued to alter voicing (loud like me).         SLP Education - 04/19/15 1629    Education provided Yes   Education Details Details of the LSVT-LOUD protocol   Person(s) Educated Patient   Methods Explanation   Comprehension Verbalized understanding           SLP Long Term Goals - 04/19/15 1634    SLP LONG TERM GOAL #1   Title The patient will complete Daily Tasks (Maximum duration "ah", High/Lows, and Functional Phrases) at average loudness of 80 dB and with loud, good quality voice.    Time 5   Period Weeks   Status New   SLP LONG TERM GOAL #2   Title The patient will complete Hierarchal Speech Loudness reading drills (words/phrases, sentences, and paragraph) at average 75 dB and with loud, good quality voice.     Time 5   Period Weeks   Status New   SLP LONG TERM GOAL #3   Title The patient will complete homework daily.   Time 5   Period Weeks   Status New   SLP LONG TERM GOAL #4   Title The patient will participate in conversation, maintaining average loudness of 75 dB and loud, good quality voice.   Time 5   Period Weeks   Status New          Plan - 04/19/15 1630    Clinical Impression  Statement This 74 year old woman, with Parkinson' disease, is presenting with moderate dysphonia characterized by hypophonia, monotone voice, and hoarse vocal quality.  Based on stimulability testing, the patient is judged to be a good candidate for the LSVT LOUD program.  It is recommended that the patient receive the LSVT LOUD program which is comprised of 16 intensive sessions (4 times per week for 4 weeks, one hour sessions).  LSVT LOUD has been documented in the literature as efficacious for individuals with Parkinson's disease.     Speech Therapy Frequency 4x / week   Duration Other (comment)  5 weeks   Treatment/Interventions Other (comment)  LSVT-LOUD protocol   Potential to Achieve Goals Good   Potential Considerations Ability to learn/carryover information;Cooperation/participation level;Previous level of function;Severity of impairments;Family/community support   SLP Home Exercise Plan LSVT-LOUD homework   Consulted and Agree with Plan of Care Patient          G-Codes - 24-Apr-2015 1636    Functional Assessment Tool Used LSVT-LOUD evaluation ptocol   Functional Limitations Voice   Voice Current Status (G9171) At least 40 percent but less than 60 percent impaired, limited or restricted   Voice Goal Status (G9172) At least 1 percent but less than 20 percent impaired, limited or restricted      Problem List Patient Active Problem List   Diagnosis Date Noted   Tremor 02/07/2015   Dysphagia, pharyngoesophageal phase 02/07/2015   Hypercalcemia 09/14/2013   Unspecified vitamin D deficiency 04/27/2013   Breast cancer, left breast 09/16/2012   Osteoporosis, post-menopausal 03/14/2012   Dyspnea 03/28/2011   Cough 03/13/2011   ATYPICAL MYCOBACTERIAL INFECTION 01/01/2011   AORTIC ANEURYSM 01/01/2011   Headache(784.0) 01/01/2011   OSTEOARTHRITIS, CERVICAL SPINE 08/15/2009   HYPERGLYCEMIA, FASTING 12/06/2008   ALLERGIC RHINITIS 03/25/2008   CHRONIC OBSTRUCTIVE ASTHMA  UNSPECIFIED 03/25/2008   BREAST CANCER, HX OF 03/10/2008   Mixed hyperlipidemia 02/10/2007   Essential hypertension 02/10/2007   Leroy Sea, MS/CCC- SLP  Lou Miner Apr 24, 2015, 4:41 PM  [place] [facility] MAIN [facility] SERVICES [address] [place], [place], [address] Phone: [phone]   Fax:  [phone]

## 2015-04-26 ENCOUNTER — Ambulatory Visit: Payer: Medicare Other | Admitting: Speech Pathology

## 2015-04-26 ENCOUNTER — Encounter: Payer: Self-pay | Admitting: Physical Therapy

## 2015-04-26 ENCOUNTER — Encounter: Payer: Medicare Other | Admitting: Internal Medicine

## 2015-04-26 ENCOUNTER — Encounter: Payer: Self-pay | Admitting: Speech Pathology

## 2015-04-26 ENCOUNTER — Ambulatory Visit: Payer: Medicare Other | Admitting: Physical Therapy

## 2015-04-26 DIAGNOSIS — R490 Dysphonia: Secondary | ICD-10-CM

## 2015-04-26 DIAGNOSIS — R262 Difficulty in walking, not elsewhere classified: Secondary | ICD-10-CM

## 2015-04-26 DIAGNOSIS — G20 Parkinson's disease: Secondary | ICD-10-CM

## 2015-04-26 NOTE — Therapy (Signed)
[place] MAIN [facility] SERVICES [address] [place], [place], [address] Phone: [phone]   Fax:  [phone]  Speech Language Pathology Treatment  Patient Details  Name: Crystal Bass MRN: [id number] Date of Birth: October 15, 1941 Referring Provider:  Ludwig Clarks, DO  Encounter Date: 04/26/2015      End of Session - 04/26/15 1600    Visit Number 2   Number of Visits 17   Date for SLP Re-Evaluation 05/25/15   SLP Start Time 1500   SLP Stop Time  1550   SLP Time Calculation (min) 50 min   Activity Tolerance Patient tolerated treatment well      Past Medical History  Diagnosis Date  . Pancreatitis     secondary to Cholelithiasis  . Breast cancer 2002    infiltrative ductal carcinoma   . Lesion of breast 1992    right, benign  . Post-thoracotomy pain syndrome   . Uterine fibroid   . Endometriosis   . Ovarian cancer 2004  . Aneurysm of aorta     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  . Osteoporosis, post-menopausal 03/14/2012  . Ductal carcinoma of breast, estrogen receptor positive, stage 1 09/16/2012  . Pseudomonas pneumonia 5/09  . COPD (chronic obstructive pulmonary disease) 9/08  . Mastalgia 7/95  . Lung disease     secondary to [name] infection  . Hypercalcemia 09/14/2013  . Vitamin D deficiency   . Aortic root aneurysm     Past Surgical History  Procedure Laterality Date  . Lung biopsy  2002    [name], Dr [name]  . Cardiac catheterization  2007    essentially negation for significant CAD  . Appendectomy  2004  . Cholecystectomy  2001  . 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 Breast lumpectomy  1992    benign  . Mastectomy modified radical  2002    oral chemotheraphy (tamoxifen then Armidex) no radiation, Dr.[name]  . Tonsillectomy  1946  . Breast biopsy  08/2004    right breast-benign  . Colonoscopy  2014    Dr [name] , due 2019    There were no vitals filed  for this visit.  Visit Diagnosis: Dysphonia  Parkinson disease      Subjective Assessment - 04/26/15 1558    Subjective Patient does not think her voice sounds better when she is loud   Currently in Pain? No/denies               ADULT SLP TREATMENT - 04/26/15 1555    Treatment Provided   Treatment provided Cognitive-Linquistic   Pain Assessment   Pain Assessment No/denies pain   Cognitive-Linquistic Treatment   Treatment focused on LSVT-LOUD Protocol: Daily Task #1 Maximum sustained "ah".  Daily Task #2 Maximum fundamental frequency range.  Daily Task #3 Maximum speech loudness drill of functional phrases.  Hierarchal speech loudness drills.    Skilled Treatment Daily Task #1: Average 9 seconds, 82 dB. Daily Task 2: Highs: 15 high pitched "ah" given mod cues. Lows: 15 low pitched "ah" given min-mod cues. Daily task #3: Average 75 dB.  Hierarchal speech loudness drill: Read sentences, 75 dB. Homework: assignments given.  Off the cuff remarks: average 68 dB; up to 73 dB given reminders to be loud.     Assessment / Recommendations / Plan   Plan Continue with current plan of care   Progression Toward Goals  Progression toward goals Progressing toward goals          SLP Education - 04/26/15 1559    Education provided Yes   Education Details LSVT LOUD homework   Person(s) Educated Patient   Methods Explanation;Handout   Comprehension Verbalized understanding;Returned demonstration            SLP Long Term Goals - 04/19/15 1634    SLP LONG TERM GOAL #1   Title The patient will complete Daily Tasks (Maximum duration "ah", High/Lows, and Functional Phrases) at average loudness of 80 dB and with loud, good quality voice.    Time 5   Period Weeks   Status New   SLP LONG TERM GOAL #2   Title The patient will complete Hierarchal Speech Loudness reading drills (words/phrases, sentences, and paragraph) at average 75 dB and with loud, good quality voice.     Time 5    Period Weeks   Status New   SLP LONG TERM GOAL #3   Title The patient will complete homework daily.   Time 5   Period Weeks   Status New   SLP LONG TERM GOAL #4   Title The patient will participate in conversation, maintaining average loudness of 75 dB and loud, good quality voice.   Time 5   Period Weeks   Status New          Plan - 04/26/15 1600    Clinical Impression Statement The patient is completing daily tasks and hierarchal speech drill tasks with loud, good quality voice given min SLP cues.     Speech Therapy Frequency 4x / week   Duration Other (comment)   Treatment/Interventions Other (comment)  LSVT LOUD protocol   Potential to Achieve Goals Good   Potential Considerations Ability to learn/carryover information;Cooperation/participation level;Previous level of function;Severity of impairments;Family/community support   SLP Home Exercise Plan LSVT-LOUD homework   Consulted and Agree with Plan of Care Patient        Problem List Patient Active Problem List   Diagnosis Date Noted  . Tremor 02/07/2015  . Dysphagia, pharyngoesophageal phase 02/07/2015  . Hypercalcemia 09/14/2013  . Unspecified vitamin D deficiency 04/27/2013  . Breast cancer, left breast 09/16/2012  . Osteoporosis, post-menopausal 03/14/2012  . Dyspnea 03/28/2011  . Cough 03/13/2011  . ATYPICAL MYCOBACTERIAL INFECTION 01/01/2011  . AORTIC ANEURYSM 01/01/2011  . Headache(784.0) 01/01/2011  . OSTEOARTHRITIS, CERVICAL SPINE 08/15/2009  . HYPERGLYCEMIA, FASTING 12/06/2008  . ALLERGIC RHINITIS 03/25/2008  . CHRONIC OBSTRUCTIVE ASTHMA UNSPECIFIED 03/25/2008  . BREAST CANCER, HX OF 03/10/2008  . Mixed hyperlipidemia 02/10/2007  . Essential hypertension 02/10/2007   Leroy Sea, MS/CCC- SLP  Lou Miner 04/26/2015, 4:03 PM  [place] MAIN [facility] SERVICES [address] [place], [place], [address] Phone: [phone]   Fax:   [phone]

## 2015-04-26 NOTE — Therapy (Signed)
[place] MAIN [facility] SERVICES [address] [place], [place], [address] Phone: [phone]   Fax:  [phone]  Physical Therapy Treatment  Patient Details  Name: Crystal Bass MRN: [id number] Date of Birth: 07-19-1941 Referring Provider:  Hendricks Limes, MD  Encounter Date: 04/26/2015      PT End of Session - 04/26/15 1419    Visit Number 2   Number of Visits 17   PT Start Time 0155   PT Stop Time 0250   PT Time Calculation (min) 55 min   Activity Tolerance Patient tolerated treatment well   Behavior During Therapy [facility] for tasks assessed/performed;Anxious      Past Medical History  Diagnosis Date  . Pancreatitis     secondary to Cholelithiasis  . Breast cancer 2002    infiltrative ductal carcinoma   . Lesion of breast 1992    right, benign  . Post-thoracotomy pain syndrome   . Uterine fibroid   . Endometriosis   . Ovarian cancer 2004  . Aneurysm of aorta     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  . Osteoporosis, post-menopausal 03/14/2012  . Ductal carcinoma of breast, estrogen receptor positive, stage 1 09/16/2012  . Pseudomonas pneumonia 5/09  . COPD (chronic obstructive pulmonary disease) 9/08  . Mastalgia 7/95  . Lung disease     secondary to [name] infection  . Hypercalcemia 09/14/2013  . Vitamin D deficiency   . Aortic root aneurysm     Past Surgical History  Procedure Laterality Date  . Lung biopsy  2002    [name], Dr [name]  . Cardiac catheterization  2007    essentially negation for significant CAD  . Appendectomy  2004  . Cholecystectomy  2001  . 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 Breast lumpectomy  1992    benign  . Mastectomy modified radical  2002    oral chemotheraphy (tamoxifen then Armidex) no radiation, Dr.[name]  . Tonsillectomy  1946  . Breast biopsy  08/2004    right breast-benign  . Colonoscopy  2014    Dr [name] , due 2019    There  were no vitals filed for this visit.  Visit Diagnosis:  Difficulty walking      Subjective Assessment - 04/26/15 1412    Subjective Patient has a cough today and she wants to review her 5 functional tasks.           floor to ceiling x 10 reps, side to side 10 reps, step and reach forward x 10 reps, step and reach backwards x 10,  step and reach sideways x 10 , Rock and reach forward/backward x 10 , Rock and reach sideways x 10, functional tasks; 1 sit to stand 2.tunring her head to be abe to see for driving 3. hand coordination tasks 4 washing hair and drying hair.5. Writing tasks Max cueing needed to appropriately perform LSVT tasks with leg, hand, and head position.                        PT Education - 04/26/15 1418    Education provided Yes   Education Details LSVT BIG HEP and 5 funtional tasks   Person(s) Educated Patient   Methods Explanation;Demonstration;Tactile cues   Comprehension Verbalized understanding;Returned demonstration;Verbal cues required             PT Long Term Goals -  04/19/15 1557    PT LONG TERM GOAL #1   Title Patient will be able to stand with unilateral upper extremity support for >  minutes in order to independently perform ADL's.   Status New   PT LONG TERM GOAL #2   Title Patient will be able to reach > 12  inches without loss of balance to reduce fall risk with functional activities.   Status New   PT LONG TERM GOAL #3   Title Pt will increase 6 minute walk time by 150 feet to improve endurance and aid in improving mobility   Status New   PT LONG TERM GOAL #4   Title Patient will be independent with home exercise program for self-management of ankle symptoms/ difficulty.   Status New               Plan - 04/26/15 1421    Clinical Impression Statement Mod ueing needed to appropriately perform LSVT tasks with leg, hand, and head position. Decreased coordination demonstrated requiring consistent verbal cueing to  correct form. Cognitive understanding of task was delayed.CGA to SBA for safety with activities.  [place] increase intensity and amplitude of movements throughout sessionPatient is short of breath during treatment.    Pt will benefit from skilled therapeutic intervention in order to improve on the following deficits Decreased coordination;Difficulty walking;Impaired tone;Decreased activity tolerance;Decreased balance;Impaired flexibility;Decreased mobility;Decreased strength   Rehab Potential Good   PT Frequency 4x / week   PT Duration 4 weeks   Consulted and Agree with Plan of Care Patient        Problem List Patient Active Problem List   Diagnosis Date Noted  . Tremor 02/07/2015  . Dysphagia, pharyngoesophageal phase 02/07/2015  . Hypercalcemia 09/14/2013  . Unspecified vitamin D deficiency 04/27/2013  . Breast cancer, left breast 09/16/2012  . Osteoporosis, post-menopausal 03/14/2012  . Dyspnea 03/28/2011  . Cough 03/13/2011  . ATYPICAL MYCOBACTERIAL INFECTION 01/01/2011  . AORTIC ANEURYSM 01/01/2011  . Headache(784.0) 01/01/2011  . OSTEOARTHRITIS, CERVICAL SPINE 08/15/2009  . HYPERGLYCEMIA, FASTING 12/06/2008  . ALLERGIC RHINITIS 03/25/2008  . CHRONIC OBSTRUCTIVE ASTHMA UNSPECIFIED 03/25/2008  . BREAST CANCER, HX OF 03/10/2008  . Mixed hyperlipidemia 02/10/2007  . Essential hypertension 02/10/2007    Alanson Puls 04/26/2015, 2:51 PM  [place] MAIN [facility] SERVICES [address] [place], [place], [address] Phone: [phone]   Fax:  [phone]

## 2015-04-27 ENCOUNTER — Encounter: Payer: Self-pay | Admitting: Physical Therapy

## 2015-04-27 ENCOUNTER — Encounter: Payer: Self-pay | Admitting: Speech Pathology

## 2015-04-27 ENCOUNTER — Ambulatory Visit: Payer: Medicare Other | Attending: Neurology | Admitting: Speech Pathology

## 2015-04-27 ENCOUNTER — Ambulatory Visit: Payer: Medicare Other | Admitting: Physical Therapy

## 2015-04-27 DIAGNOSIS — R490 Dysphonia: Secondary | ICD-10-CM | POA: Diagnosis not present

## 2015-04-27 DIAGNOSIS — G20 Parkinson's disease: Secondary | ICD-10-CM | POA: Diagnosis present

## 2015-04-27 DIAGNOSIS — R262 Difficulty in walking, not elsewhere classified: Secondary | ICD-10-CM | POA: Insufficient documentation

## 2015-04-27 NOTE — Therapy (Signed)
[place] MAIN [facility] SERVICES [address] [place], [place], [address] Phone: [phone]   Fax:  [phone]  Speech Language Pathology Treatment  Patient Details  Name: Crystal Bass MRN: [id number] Date of Birth: [birth year] Referring Provider:  Hendricks Limes, MD  Encounter Date: 04/27/2015      End of Session - 04/27/15 1601    Visit Number 3   Number of Visits 17   Date for SLP Re-Evaluation 05/25/15   SLP Start Time 1501   SLP Stop Time  6378   SLP Time Calculation (min) 56 min   Activity Tolerance Patient tolerated treatment well      Past Medical History  Diagnosis Date  . Pancreatitis     secondary to Cholelithiasis  . Breast cancer 2002    infiltrative ductal carcinoma   . Lesion of breast 1992    right, benign  . Post-thoracotomy pain syndrome   . Uterine fibroid   . Endometriosis   . Ovarian cancer 2004  . Aneurysm of aorta     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  . Osteoporosis, post-menopausal 03/14/2012  . Ductal carcinoma of breast, estrogen receptor positive, stage 1 09/16/2012  . Pseudomonas pneumonia 5/09  . COPD (chronic obstructive pulmonary disease) 9/08  . Mastalgia 7/95  . Lung disease     secondary to [name] infection  . Hypercalcemia 09/14/2013  . Vitamin D deficiency   . Aortic root aneurysm     Past Surgical History  Procedure Laterality Date  . Lung biopsy  2002    [name], Dr [name]  . Cardiac catheterization  2007    essentially negation for significant CAD  . Appendectomy  2004  . Cholecystectomy  2001  . 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 Breast lumpectomy  1992    benign  . Mastectomy modified radical  2002    oral chemotheraphy (tamoxifen then Armidex) no radiation, Dr.[name]  . Tonsillectomy  1946  . Breast biopsy  08/2004    right breast-benign  . Colonoscopy  2014    Dr [name] , due 2019    There were no vitals filed  for this visit.  Visit Diagnosis: Dysphonia  Parkinson disease      Subjective Assessment - 04/27/15 1600    Subjective Patient does not think her voice sounds better when she is loud   Currently in Pain? No/denies               ADULT SLP TREATMENT - 04/27/15 1557    Treatment Provided   Treatment provided Cognitive-Linquistic   Pain Assessment   Pain Assessment No/denies pain   Cognitive-Linquistic Treatment   Treatment focused on Other (comment)  LSVT-LOUD Protocol:     Skilled Treatment Daily Task #1: Average 8 seconds, 80 dB. Daily Task 2: Highs: 15 high pitched "ah" given mod cues. Lows: 15 low pitched "ah" given min-mod cues. Daily task #3: Average 75 dB.  Hierarchal speech loudness drill: Read sentences, 75 dB. Homework: assignments completed.  Off the cuff remarks: average 68 dB; up to 73 dB given reminders to be loud.     Assessment / Recommendations / Plan   Plan Continue with current plan of care   Progression Toward Goals   Progression toward goals Progressing toward goals          SLP Education - 04/27/15 1601    Education provided  Yes   Education Details LSVT LOUD   Person(s) Educated Patient   Methods Explanation;Demonstration;Handout   Comprehension Verbalized understanding;Returned demonstration            SLP Long Term Goals - 04/19/15 1634    SLP LONG TERM GOAL #1   Title The patient will complete Daily Tasks (Maximum duration "ah", High/Lows, and Functional Phrases) at average loudness of 80 dB and with loud, good quality voice.    Time 5   Period Weeks   Status New   SLP LONG TERM GOAL #2   Title The patient will complete Hierarchal Speech Loudness reading drills (words/phrases, sentences, and paragraph) at average 75 dB and with loud, good quality voice.     Time 5   Period Weeks   Status New   SLP LONG TERM GOAL #3   Title The patient will complete homework daily.   Time 5   Period Weeks   Status New   SLP LONG TERM GOAL #4    Title The patient will participate in conversation, maintaining average loudness of 75 dB and loud, good quality voice.   Time 5   Period Weeks   Status New          Plan - 04/27/15 1601    Clinical Impression Statement The patient is completing daily tasks and hierarchal speech drill tasks with loud, good quality voice given min SLP cues.     Speech Therapy Frequency 4x / week   Duration Other (comment)   Treatment/Interventions Other (comment)   Potential Considerations Ability to learn/carryover information;Cooperation/participation level;Previous level of function;Severity of impairments;Family/community support   SLP Home Exercise Plan LSVT-LOUD homework   Consulted and Agree with Plan of Care Patient        Problem List Patient Active Problem List   Diagnosis Date Noted  . Tremor 02/07/2015  . Dysphagia, pharyngoesophageal phase 02/07/2015  . Hypercalcemia 09/14/2013  . Unspecified vitamin D deficiency 04/27/2013  . Breast cancer, left breast 09/16/2012  . Osteoporosis, post-menopausal 03/14/2012  . Dyspnea 03/28/2011  . Cough 03/13/2011  . ATYPICAL MYCOBACTERIAL INFECTION 01/01/2011  . AORTIC ANEURYSM 01/01/2011  . Headache(784.0) 01/01/2011  . OSTEOARTHRITIS, CERVICAL SPINE 08/15/2009  . HYPERGLYCEMIA, FASTING 12/06/2008  . ALLERGIC RHINITIS 03/25/2008  . CHRONIC OBSTRUCTIVE ASTHMA UNSPECIFIED 03/25/2008  . BREAST CANCER, HX OF 03/10/2008  . Mixed hyperlipidemia 02/10/2007  . Essential hypertension 02/10/2007   Leroy Sea, MS/CCC- SLP  Lou Miner 04/27/2015, 4:03 PM  [place] MAIN [facility] SERVICES [address] [place], [place], [address] Phone: [phone]   Fax:  [phone]

## 2015-04-27 NOTE — Therapy (Signed)
[place] MAIN [facility] SERVICES [address] [place], [place], [address] Phone: [phone]   Fax:  [phone]  Physical Therapy Treatment  Patient Details  Name: Crystal Bass MRN: [id number] Date of Birth: Mar 03, 1941 Referring Provider:  Hendricks Limes, MD  Encounter Date: 04/27/2015      PT End of Session - 04/27/15 1415    Visit Number 3   Number of Visits 17   Date for PT Re-Evaluation 05/25/15   PT Start Time 0200   PT Stop Time 0300   PT Time Calculation (min) 60 min   Activity Tolerance Patient tolerated treatment well   Behavior During Therapy [facility] for tasks assessed/performed;Anxious      Past Medical History  Diagnosis Date  . Pancreatitis     secondary to Cholelithiasis  . Breast cancer 2002    infiltrative ductal carcinoma   . Lesion of breast 1992    right, benign  . Post-thoracotomy pain syndrome   . Uterine fibroid   . Endometriosis   . Ovarian cancer 2004  . Aneurysm of aorta     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  . Osteoporosis, post-menopausal 03/14/2012  . Ductal carcinoma of breast, estrogen receptor positive, stage 1 09/16/2012  . Pseudomonas pneumonia 5/09  . COPD (chronic obstructive pulmonary disease) 9/08  . Mastalgia 7/95  . Lung disease     secondary to [name] infection  . Hypercalcemia 09/14/2013  . Vitamin D deficiency   . Aortic root aneurysm     Past Surgical History  Procedure Laterality Date  . Lung biopsy  2002    [name], Dr [name]  . Cardiac catheterization  2007    essentially negation for significant CAD  . Appendectomy  2004  . Cholecystectomy  2001  . 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 Breast lumpectomy  1992    benign  . Mastectomy modified radical  2002    oral chemotheraphy (tamoxifen then Armidex) no radiation, Dr.[name]  . Tonsillectomy  1946  . Breast biopsy  08/2004    right breast-benign  . Colonoscopy  2014     Dr [name] , due 2019    There were no vitals filed for this visit.  Visit Diagnosis:  Difficulty walking      Subjective Assessment - 04/27/15 1411    Subjective Patient did her exercises for her HEP.           Ffoor to ceiling x 10 reps, side to side 10 reps, step and reach forward x 10 reps, step and reach backwards x 10, step and reach sideways x 10 , Rock and reach forward/backward x 10 , Rock and reach sideways x 10, functional tasks; 1 sit to stand 2.tunring her head to be abe to see for driving 3. hand coordination tasks 4 washing hair and drying hair.5. Writing tasks Max cueing needed to appropriately perform LSVT tasks with leg, hand, and head position. Fatigue with sit to stand but demonstrating more control                         PT Education - 04/27/15 1414    Education Details LSVT BIG   Person(s) Educated Patient   Methods Explanation;Demonstration   Comprehension Verbalized understanding;Returned demonstration             PT Long Term Goals - 04/19/15 1557  PT LONG TERM GOAL #1   Title Patient will be able to stand with unilateral upper extremity support for >  minutes in order to independently perform ADL's.   Status New   PT LONG TERM GOAL #2   Title Patient will be able to reach > 12  inches without loss of balance to reduce fall risk with functional activities.   Status New   PT LONG TERM GOAL #3   Title Pt will increase 6 minute walk time by 150 feet to improve endurance and aid in improving mobility   Status New   PT LONG TERM GOAL #4   Title Patient will be independent with home exercise program for self-management of ankle symptoms/ difficulty.   Status New               Plan - 04/27/15 1416    Clinical Impression Statement  Decreased coordination demonstrated requiring consistent verbal cueing to correct form.Max cueing needed to appropriately perform LSVT tasks with leg, hand, and head position   Pt will  benefit from skilled therapeutic intervention in order to improve on the following deficits Decreased coordination;Difficulty walking;Impaired tone;Decreased activity tolerance;Decreased balance;Impaired flexibility;Decreased mobility;Decreased strength   Rehab Potential Good   PT Frequency 4x / week   PT Duration 4 weeks   Consulted and Agree with Plan of Care Patient        Problem List Patient Active Problem List   Diagnosis Date Noted  . Tremor 02/07/2015  . Dysphagia, pharyngoesophageal phase 02/07/2015  . Hypercalcemia 09/14/2013  . Unspecified vitamin D deficiency 04/27/2013  . Breast cancer, left breast 09/16/2012  . Osteoporosis, post-menopausal 03/14/2012  . Dyspnea 03/28/2011  . Cough 03/13/2011  . ATYPICAL MYCOBACTERIAL INFECTION 01/01/2011  . AORTIC ANEURYSM 01/01/2011  . Headache(784.0) 01/01/2011  . OSTEOARTHRITIS, CERVICAL SPINE 08/15/2009  . HYPERGLYCEMIA, FASTING 12/06/2008  . ALLERGIC RHINITIS 03/25/2008  . CHRONIC OBSTRUCTIVE ASTHMA UNSPECIFIED 03/25/2008  . BREAST CANCER, HX OF 03/10/2008  . Mixed hyperlipidemia 02/10/2007  . Essential hypertension 02/10/2007    Alanson Puls 04/27/2015, 2:20 PM  [place] MAIN [facility] SERVICES [address] [place], [place], [address] Phone: [phone]   Fax:  [phone]

## 2015-04-28 ENCOUNTER — Ambulatory Visit: Payer: Medicare Other | Admitting: Speech Pathology

## 2015-04-28 ENCOUNTER — Encounter: Payer: Self-pay | Admitting: Physical Therapy

## 2015-04-28 ENCOUNTER — Encounter: Payer: Self-pay | Admitting: Speech Pathology

## 2015-04-28 ENCOUNTER — Ambulatory Visit: Payer: Medicare Other | Admitting: Physical Therapy

## 2015-04-28 DIAGNOSIS — R262 Difficulty in walking, not elsewhere classified: Secondary | ICD-10-CM

## 2015-04-28 DIAGNOSIS — R490 Dysphonia: Secondary | ICD-10-CM

## 2015-04-28 DIAGNOSIS — G20 Parkinson's disease: Secondary | ICD-10-CM

## 2015-04-28 NOTE — Therapy (Signed)
[place] MAIN [facility] SERVICES [address] [place], [place], [address] Phone: [phone]   Fax:  [phone]  Physical Therapy Treatment  Patient Details  Name: Crystal Bass MRN: [id number] Date of Birth: 07-04-1941 Referring Provider:  Hendricks Limes, MD  Encounter Date: 04/28/2015      PT End of Session - 04/28/15 1409    Visit Number 4   Number of Visits 17   Date for PT Re-Evaluation 05/25/15   PT Start Time 0200   PT Stop Time 0300   PT Time Calculation (min) 60 min   Activity Tolerance Patient tolerated treatment well   Behavior During Therapy [facility] for tasks assessed/performed;Anxious      Past Medical History  Diagnosis Date  . Pancreatitis     secondary to Cholelithiasis  . Breast cancer 2002    infiltrative ductal carcinoma   . Lesion of breast 1992    right, benign  . Post-thoracotomy pain syndrome   . Uterine fibroid   . Endometriosis   . Ovarian cancer 2004  . Aneurysm of aorta     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  . Osteoporosis, post-menopausal 03/14/2012  . Ductal carcinoma of breast, estrogen receptor positive, stage 1 09/16/2012  . Pseudomonas pneumonia 5/09  . COPD (chronic obstructive pulmonary disease) 9/08  . Mastalgia 7/95  . Lung disease     secondary to [name] infection  . Hypercalcemia 09/14/2013  . Vitamin D deficiency   . Aortic root aneurysm     Past Surgical History  Procedure Laterality Date  . Lung biopsy  2002    [name], Dr [name]  . Cardiac catheterization  2007    essentially negation for significant CAD  . Appendectomy  2004  . Cholecystectomy  2001  . 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 Breast lumpectomy  1992    benign  . Mastectomy modified radical  2002    oral chemotheraphy (tamoxifen then Armidex) no radiation, Dr.[name]  . Tonsillectomy  1946  . Breast biopsy  08/2004    right breast-benign  . Colonoscopy  2014     Dr [name] , due 2019    There were no vitals filed for this visit.  Visit Diagnosis:  Difficulty walking      Subjective Assessment - 04/28/15 1408    Subjective Patient did her exercises for her HEP.         Floor to ceiling x 10 reps, side to side 10 reps, step and reach forward x 10 reps, step and reach backwards x 10, step and reach sideways x 10 , Rock and reach forward/backward x 10 , Rock and reach sideways x 10, functional tasks; 1 sit to stand 2 Pinching activities to mimic clipping her nails with clippers 3. hand coordination tasks 4 washing hair and drying hair.5. Writing tasks Max cueing needed to appropriately perform LSVT tasks with leg, hand, and head position. Fatigue with sit to stand but demonstrating more control                          PT Education - 04/28/15 1408    Education provided Yes   Education Details LSVT BIG   Person(s) Educated Patient   Methods Explanation;Demonstration   Comprehension Verbalized understanding;Returned demonstration             PT Long Term Goals -  04/19/15 1557    PT LONG TERM GOAL #1   Title Patient will be able to stand with unilateral upper extremity support for >  minutes in order to independently perform ADL's.   Status New   PT LONG TERM GOAL #2   Title Patient will be able to reach > 12  inches without loss of balance to reduce fall risk with functional activities.   Status New   PT LONG TERM GOAL #3   Title Pt will increase 6 minute walk time by 150 feet to improve endurance and aid in improving mobility   Status New   PT LONG TERM GOAL #4   Title Patient will be independent with home exercise program for self-management of ankle symptoms/ difficulty.   Status New               Plan - 04/28/15 1410    Clinical Impression Statement pt especially has difficulty with opening hands up towards the ceiling with multiple exercises and with extending his leg during seated side to side  multi-directional reaching activity . Patient will continue to benefit from LSVT to improve mobility and ADL's   Pt will benefit from skilled therapeutic intervention in order to improve on the following deficits Decreased coordination;Difficulty walking;Impaired tone;Decreased activity tolerance;Decreased balance;Impaired flexibility;Decreased mobility;Decreased strength   PT Frequency 4x / week   PT Duration 4 weeks   PT Treatment/Interventions Therapeutic activities;Therapeutic exercise;Gait training;Balance training   PT Next Visit Plan LSVT BIG   PT Home Exercise Plan LSVT BIG   Consulted and Agree with Plan of Care Patient        Problem List Patient Active Problem List   Diagnosis Date Noted  . Tremor 02/07/2015  . Dysphagia, pharyngoesophageal phase 02/07/2015  . Hypercalcemia 09/14/2013  . Unspecified vitamin D deficiency 04/27/2013  . Breast cancer, left breast 09/16/2012  . Osteoporosis, post-menopausal 03/14/2012  . Dyspnea 03/28/2011  . Cough 03/13/2011  . ATYPICAL MYCOBACTERIAL INFECTION 01/01/2011  . AORTIC ANEURYSM 01/01/2011  . Headache(784.0) 01/01/2011  . OSTEOARTHRITIS, CERVICAL SPINE 08/15/2009  . HYPERGLYCEMIA, FASTING 12/06/2008  . ALLERGIC RHINITIS 03/25/2008  . CHRONIC OBSTRUCTIVE ASTHMA UNSPECIFIED 03/25/2008  . BREAST CANCER, HX OF 03/10/2008  . Mixed hyperlipidemia 02/10/2007  . Essential hypertension 02/10/2007    Alanson Puls 04/28/2015, 2:13 PM  [place] MAIN [facility] SERVICES [address] [place], [place], [address] Phone: [phone]   Fax:  [phone]

## 2015-04-28 NOTE — Therapy (Signed)
[place] MAIN [facility] SERVICES [address] [place], [place], [address] Phone: [phone]   Fax:  [phone]  Speech Language Pathology Treatment  Patient Details  Name: Crystal Bass MRN: [id number] Date of Birth: [birth year] Referring Provider:  Hendricks Limes, MD  Encounter Date: 04/28/2015      End of Session - 04/28/15 1606    Visit Number 4   Number of Visits 17   Date for SLP Re-Evaluation 05/25/15   SLP Start Time 1500   SLP Stop Time  89   SLP Time Calculation (min) 56 min   Activity Tolerance Patient tolerated treatment well      Past Medical History  Diagnosis Date  . Pancreatitis     secondary to Cholelithiasis  . Breast cancer 2002    infiltrative ductal carcinoma   . Lesion of breast 1992    right, benign  . Post-thoracotomy pain syndrome   . Uterine fibroid   . Endometriosis   . Ovarian cancer 2004  . Aneurysm of aorta     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  . Osteoporosis, post-menopausal 03/14/2012  . Ductal carcinoma of breast, estrogen receptor positive, stage 1 09/16/2012  . Pseudomonas pneumonia 5/09  . COPD (chronic obstructive pulmonary disease) 9/08  . Mastalgia 7/95  . Lung disease     secondary to [name] infection  . Hypercalcemia 09/14/2013  . Vitamin D deficiency   . Aortic root aneurysm     Past Surgical History  Procedure Laterality Date  . Lung biopsy  2002    [name], Dr [name]  . Cardiac catheterization  2007    essentially negation for significant CAD  . Appendectomy  2004  . Cholecystectomy  2001  . 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 Breast lumpectomy  1992    benign  . Mastectomy modified radical  2002    oral chemotheraphy (tamoxifen then Armidex) no radiation, Dr.[name]  . Tonsillectomy  1946  . Breast biopsy  08/2004    right breast-benign  . Colonoscopy  2014    Dr [name] , due 2019    There were no vitals filed  for this visit.  Visit Diagnosis: Dysphonia  Parkinson disease      Subjective Assessment - 04/28/15 1603    Subjective The patient reports that she is becoming more comfortable with her loud voice   Currently in Pain? No/denies          ADULT SLP TREATMENT - 04/28/15 1557    Treatment Provided   Treatment provided Cognitive-Linquistic   Pain Assessment   Pain Assessment No/denies pain   Cognitive-Linquistic Treatment   Treatment focused on Other (comment)  LSVT-LOUD Protocol: Daily Task #1 Maximum sustained "ah".  D   Skilled Treatment Daily Task #1: Average 9 seconds, 83 dB. Daily Task 2: Highs: 15 high pitched "ah" given min-mod cues. Lows: 15 low pitched "ah" given min-mod cues. Daily task #3: Average 75 dB.  Hierarchal speech loudness drill: Read sentences, 75 dB. Generate sentences to answer questions, 70 dB.  Homework: assignments completed.  Off the cuff remarks: average 68 dB; up to 73 dB given reminders to be loud.     Assessment / Recommendations / Plan   Plan Continue with current plan of care   Progression Toward Goals   Progression toward goals Progressing toward goals  SLP Education - 04/28/15 1605    Education provided Yes   Education Details LSVT LOUD   Person(s) Educated Patient   Methods Explanation;Demonstration;Handout   Comprehension Verbalized understanding;Returned demonstration          SLP Long Term Goals - 04/19/15 1634    SLP LONG TERM GOAL #1   Title The patient will complete Daily Tasks (Maximum duration "ah", High/Lows, and Functional Phrases) at average loudness of 80 dB and with loud, good quality voice.    Time 5   Period Weeks   Status New   SLP LONG TERM GOAL #2   Title The patient will complete Hierarchal Speech Loudness reading drills (words/phrases, sentences, and paragraph) at average 75 dB and with loud, good quality voice.     Time 5   Period Weeks   Status New   SLP LONG TERM GOAL #3   Title The patient will  complete homework daily.   Time 5   Period Weeks   Status New   SLP LONG TERM GOAL #4   Title The patient will participate in conversation, maintaining average loudness of 75 dB and loud, good quality voice.   Time 5   Period Weeks   Status New          Plan - 04/28/15 1607    Clinical Impression Statement The patient is completing daily tasks and hierarchal speech drill tasks with loud, good quality voice given min SLP cues.  She is demonstrating increased generalization into conversation.     Speech Therapy Frequency 4x / week   Duration Other (comment)   Treatment/Interventions Other (comment)  LSVT LOUD protocol   Potential to Achieve Goals Good   Potential Considerations Ability to learn/carryover information;Cooperation/participation level;Previous level of function;Severity of impairments;Family/community support   SLP Home Exercise Plan LSVT-LOUD homework   Consulted and Agree with Plan of Care Patient        Problem List Patient Active Problem List   Diagnosis Date Noted  . Tremor 02/07/2015  . Dysphagia, pharyngoesophageal phase 02/07/2015  . Hypercalcemia 09/14/2013  . Unspecified vitamin D deficiency 04/27/2013  . Breast cancer, left breast 09/16/2012  . Osteoporosis, post-menopausal 03/14/2012  . Dyspnea 03/28/2011  . Cough 03/13/2011  . ATYPICAL MYCOBACTERIAL INFECTION 01/01/2011  . AORTIC ANEURYSM 01/01/2011  . Headache(784.0) 01/01/2011  . OSTEOARTHRITIS, CERVICAL SPINE 08/15/2009  . HYPERGLYCEMIA, FASTING 12/06/2008  . ALLERGIC RHINITIS 03/25/2008  . CHRONIC OBSTRUCTIVE ASTHMA UNSPECIFIED 03/25/2008  . BREAST CANCER, HX OF 03/10/2008  . Mixed hyperlipidemia 02/10/2007  . Essential hypertension 02/10/2007   Leroy Sea, MS/CCC- SLP  Lou Miner 04/28/2015, 4:10 PM  [place] [address] [place], [place], [address] Phone: [phone]   Fax:  [phone]

## 2015-04-29 ENCOUNTER — Encounter: Payer: Self-pay | Admitting: Speech Pathology

## 2015-04-29 ENCOUNTER — Encounter: Payer: Self-pay | Admitting: Physical Therapy

## 2015-04-29 ENCOUNTER — Ambulatory Visit: Payer: Medicare Other | Admitting: Speech Pathology

## 2015-04-29 ENCOUNTER — Ambulatory Visit: Payer: Medicare Other | Admitting: Physical Therapy

## 2015-04-29 DIAGNOSIS — R490 Dysphonia: Secondary | ICD-10-CM

## 2015-04-29 DIAGNOSIS — G20 Parkinson's disease: Secondary | ICD-10-CM

## 2015-04-29 DIAGNOSIS — R262 Difficulty in walking, not elsewhere classified: Secondary | ICD-10-CM

## 2015-04-29 NOTE — Therapy (Signed)
[place] MAIN [facility] SERVICES [address] [place], [place], [address] Phone: [phone]   Fax:  [phone]  Physical Therapy Treatment  Patient Details  Name: Crystal Bass MRN: [id number] Date of Birth: 1941/11/10 Referring Provider:  Hendricks Limes, MD  Encounter Date: 04/29/2015      PT End of Session - 04/29/15 1355    Visit Number 5   Number of Visits 17   Date for PT Re-Evaluation 05/25/15   PT Start Time 0155   PT Stop Time 0250   PT Time Calculation (min) 55 min   Activity Tolerance Patient tolerated treatment well   Behavior During Therapy [facility] for tasks assessed/performed;Anxious      Past Medical History  Diagnosis Date  . Pancreatitis     secondary to Cholelithiasis  . Breast cancer 2002    infiltrative ductal carcinoma   . Lesion of breast 1992    right, benign  . Post-thoracotomy pain syndrome   . Uterine fibroid   . Endometriosis   . Ovarian cancer 2004  . Aneurysm of aorta     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  . Osteoporosis, post-menopausal 03/14/2012  . Ductal carcinoma of breast, estrogen receptor positive, stage 1 09/16/2012  . Pseudomonas pneumonia 5/09  . COPD (chronic obstructive pulmonary disease) 9/08  . Mastalgia 7/95  . Lung disease     secondary to [name] infection  . Hypercalcemia 09/14/2013  . Vitamin D deficiency   . Aortic root aneurysm     Past Surgical History  Procedure Laterality Date  . Lung biopsy  2002    [name], Dr [name]  . Cardiac catheterization  2007    essentially negation for significant CAD  . Appendectomy  2004  . Cholecystectomy  2001  . 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 Breast lumpectomy  1992    benign  . Mastectomy modified radical  2002    oral chemotheraphy (tamoxifen then Armidex) no radiation, Dr.[name]  . Tonsillectomy  1946  . Breast biopsy  08/2004    right breast-benign  . Colonoscopy  2014     Dr [name] , due 2019    There were no vitals filed for this visit.  Visit Diagnosis:  Difficulty walking      Subjective Assessment - 04/29/15 1355    Subjective Patient is doing well today.         Floor to ceiling x 10 reps, side to side 10 reps, step and reach forward x 10 reps, step and reach backwards x 10, step and reach sideways x 10 , Rock and reach forward/backward x 10 , Rock and reach sideways x 10, functional tasks; 1 sit to stand 2 Pinching activities to mimic clipping her nails with clippers 3. hand coordination tasks 4 washing hair and drying hair.5. Writing tasks Max cueing needed to appropriately perform LSVT tasks with leg, hand, and head position. Fatigue with sit to stand but demonstrating more control                          PT Education - 04/29/15 1355    Education Details LSVT BIG   Person(s) Educated Patient   Methods Explanation;Demonstration;Tactile cues   Comprehension Verbalized understanding;Returned demonstration             PT Long Term Goals - 04/19/15 1557  PT LONG TERM GOAL #1   Title Patient will be able to stand with unilateral upper extremity support for >  minutes in order to independently perform ADL's.   Status New   PT LONG TERM GOAL #2   Title Patient will be able to reach > 12  inches without loss of balance to reduce fall risk with functional activities.   Status New   PT LONG TERM GOAL #3   Title Pt will increase 6 minute walk time by 150 feet to improve endurance and aid in improving mobility   Status New   PT LONG TERM GOAL #4   Title Patient will be independent with home exercise program for self-management of ankle symptoms/ difficulty.   Status New               Plan - 04/29/15 1357    Clinical Impression Statement CGA to SBA for safety with activities.  Uses to increase intensity and amplitude of movements throughout session.   Pt will benefit from skilled therapeutic intervention  in order to improve on the following deficits Decreased coordination;Difficulty walking;Impaired tone;Decreased activity tolerance;Decreased balance;Impaired flexibility;Decreased mobility;Decreased strength   Rehab Potential Good   PT Frequency 4x / week   PT Duration 4 weeks   PT Treatment/Interventions Therapeutic activities;Therapeutic exercise;Gait training;Balance training   PT Next Visit Plan LSVT BIG   PT Home Exercise Plan LSVT BIG   Consulted and Agree with Plan of Care Patient        Problem List Patient Active Problem List   Diagnosis Date Noted  . Tremor 02/07/2015  . Dysphagia, pharyngoesophageal phase 02/07/2015  . Hypercalcemia 09/14/2013  . Unspecified vitamin D deficiency 04/27/2013  . Breast cancer, left breast 09/16/2012  . Osteoporosis, post-menopausal 03/14/2012  . Dyspnea 03/28/2011  . Cough 03/13/2011  . ATYPICAL MYCOBACTERIAL INFECTION 01/01/2011  . AORTIC ANEURYSM 01/01/2011  . Headache(784.0) 01/01/2011  . OSTEOARTHRITIS, CERVICAL SPINE 08/15/2009  . HYPERGLYCEMIA, FASTING 12/06/2008  . ALLERGIC RHINITIS 03/25/2008  . CHRONIC OBSTRUCTIVE ASTHMA UNSPECIFIED 03/25/2008  . BREAST CANCER, HX OF 03/10/2008  . Mixed hyperlipidemia 02/10/2007  . Essential hypertension 02/10/2007    Alanson Puls 04/29/2015, 1:59 PM  [place] MAIN [facility] SERVICES [address] [place], [place], [address] Phone: [phone]   Fax:  [phone]

## 2015-04-29 NOTE — Therapy (Signed)
[place] MAIN [facility] SERVICES [place], [place], [address] Phone: [phone]   Fax:  [phone]  Speech Language Pathology Treatment  Patient Details  Name: Crystal Bass MRN: [id number] Date of Birth: 08-07-1941 Referring Provider:  Hendricks Limes, MD  Encounter Date: 04/29/2015      End of Session - 04/29/15 1625    Visit Number 5   Number of Visits 17   Date for SLP Re-Evaluation 05/25/15   SLP Start Time 1500   SLP Stop Time  17   SLP Time Calculation (min) 56 min   Activity Tolerance Patient tolerated treatment well      Past Medical History  Diagnosis Date  . Pancreatitis     secondary to Cholelithiasis  . Breast cancer 2002    infiltrative ductal carcinoma   . Lesion of breast 1992    right, benign  . Post-thoracotomy pain syndrome   . Uterine fibroid   . Endometriosis   . Ovarian cancer 2004  . Aneurysm of aorta     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  . Osteoporosis, post-menopausal 03/14/2012  . Ductal carcinoma of breast, estrogen receptor positive, stage 1 09/16/2012  . Pseudomonas pneumonia 5/09  . COPD (chronic obstructive pulmonary disease) 9/08  . Mastalgia 7/95  . Lung disease     secondary to [name] infection  . Hypercalcemia 09/14/2013  . Vitamin D deficiency   . Aortic root aneurysm     Past Surgical History  Procedure Laterality Date  . Lung biopsy  2002    [name], Dr [name]  . Cardiac catheterization  2007    essentially negation for significant CAD  . Appendectomy  2004  . Cholecystectomy  2001  . 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 Breast lumpectomy  1992    benign  . Mastectomy modified radical  2002    oral chemotheraphy (tamoxifen then Armidex) no radiation, Dr.[name]  . Tonsillectomy  1946  . Breast biopsy  08/2004    right breast-benign  . Colonoscopy  2014    Dr [name] , due 2019    There were no vitals filed  for this visit.  Visit Diagnosis: Dysphonia  Parkinson disease      Subjective Assessment - 04/29/15 1624    Subjective The patient reports that she is becoming more comfortable with her loud voice   Currently in Pain? No/denies               ADULT SLP TREATMENT - 04/29/15 1622    Treatment Provided   Treatment provided Cognitive-Linquistic   Pain Assessment   Pain Assessment No/denies pain   Cognitive-Linquistic Treatment   Treatment focused on Other (comment)  LSVT LOUD   Skilled Treatment Daily Task #1: Average 9 seconds, 83 dB. Daily Task 2: Highs: 15 high pitched "ah" given min-mod cues. Lows: 15 low pitched "ah" given min-mod cues. Daily task #3: Average 75 dB.  Hierarchal speech loudness drill: Read sentences, 75 dB. Generate sentences to answer questions, 70 dB.  Homework: assignments completed.  Off the cuff remarks: average 68 dB; up to 73 dB given reminders to be loud.     Assessment / Recommendations / Plan   Plan Continue with current plan of care   Progression Toward Goals   Progression toward goals Progressing toward goals          SLP Education -  04/29/15 1624    Education provided Yes   Education Details LSVT LOUD   Person(s) Educated Patient   Methods Explanation;Demonstration;Verbal cues;Handout   Comprehension Verbalized understanding;Returned demonstration            SLP Long Term Goals - 04/19/15 1634    SLP LONG TERM GOAL #1   Title The patient will complete Daily Tasks (Maximum duration "ah", High/Lows, and Functional Phrases) at average loudness of 80 dB and with loud, good quality voice.    Time 5   Period Weeks   Status New   SLP LONG TERM GOAL #2   Title The patient will complete Hierarchal Speech Loudness reading drills (words/phrases, sentences, and paragraph) at average 75 dB and with loud, good quality voice.     Time 5   Period Weeks   Status New   SLP LONG TERM GOAL #3   Title The patient will complete homework daily.    Time 5   Period Weeks   Status New   SLP LONG TERM GOAL #4   Title The patient will participate in conversation, maintaining average loudness of 75 dB and loud, good quality voice.   Time 5   Period Weeks   Status New          Plan - 04/29/15 1625    Clinical Impression Statement The patient is completing daily tasks and hierarchal speech drill tasks with loud, good quality voice given min SLP cues.  She is demonstrating increased generalization into conversation.     Speech Therapy Frequency 4x / week   Duration Other (comment)   Treatment/Interventions Other (comment)  LSVT LOUD protocol   Potential Considerations Ability to learn/carryover information;Cooperation/participation level;Previous level of function;Severity of impairments;Family/community support   SLP Home Exercise Plan LSVT-LOUD homework   Consulted and Agree with Plan of Care Patient        Problem List Patient Active Problem List   Diagnosis Date Noted  . Tremor 02/07/2015  . Dysphagia, pharyngoesophageal phase 02/07/2015  . Hypercalcemia 09/14/2013  . Unspecified vitamin D deficiency 04/27/2013  . Breast cancer, left breast 09/16/2012  . Osteoporosis, post-menopausal 03/14/2012  . Dyspnea 03/28/2011  . Cough 03/13/2011  . ATYPICAL MYCOBACTERIAL INFECTION 01/01/2011  . AORTIC ANEURYSM 01/01/2011  . Headache(784.0) 01/01/2011  . OSTEOARTHRITIS, CERVICAL SPINE 08/15/2009  . HYPERGLYCEMIA, FASTING 12/06/2008  . ALLERGIC RHINITIS 03/25/2008  . CHRONIC OBSTRUCTIVE ASTHMA UNSPECIFIED 03/25/2008  . BREAST CANCER, HX OF 03/10/2008  . Mixed hyperlipidemia 02/10/2007  . Essential hypertension 02/10/2007   Leroy Sea, MS/CCC- SLP  Lou Miner 04/29/2015, 4:27 PM  [place] MAIN [facility] SERVICES [address] [place], [place], [address] Phone: [phone]   Fax:  [phone]

## 2015-05-02 ENCOUNTER — Encounter: Payer: Self-pay | Admitting: Physical Therapy

## 2015-05-02 ENCOUNTER — Ambulatory Visit: Payer: Medicare Other | Admitting: Speech Pathology

## 2015-05-02 ENCOUNTER — Ambulatory Visit: Payer: Medicare Other | Admitting: Physical Therapy

## 2015-05-02 DIAGNOSIS — R490 Dysphonia: Secondary | ICD-10-CM

## 2015-05-02 DIAGNOSIS — G20A1 Parkinson's disease without dyskinesia, without mention of fluctuations: Secondary | ICD-10-CM

## 2015-05-02 DIAGNOSIS — G20 Parkinson's disease: Secondary | ICD-10-CM

## 2015-05-02 DIAGNOSIS — R262 Difficulty in walking, not elsewhere classified: Secondary | ICD-10-CM

## 2015-05-02 NOTE — Therapy (Signed)
[place] MAIN [facility] SERVICES [address] [place], [place], [address] Phone: [phone]   Fax:  [phone]  Physical Therapy Treatment  Patient Details  Name: Crystal Bass MRN: [id number] Date of Birth: 05-Mar-1941 Referring Provider:  Ludwig Clarks, DO  Encounter Date: 05/02/2015      PT End of Session - 05/02/15 1420    Visit Number 6   Number of Visits 17   Date for PT Re-Evaluation 05/25/15   PT Start Time 0205   PT Stop Time 0300   PT Time Calculation (min) 55 min   Activity Tolerance Patient tolerated treatment well   Behavior During Therapy [facility] for tasks assessed/performed;Anxious      Past Medical History  Diagnosis Date  . Pancreatitis     secondary to Cholelithiasis  . Breast cancer 2002    infiltrative ductal carcinoma   . Lesion of breast 1992    right, benign  . Post-thoracotomy pain syndrome   . Uterine fibroid   . Endometriosis   . Ovarian cancer 2004  . Aneurysm of aorta     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  . Osteoporosis, post-menopausal 03/14/2012  . Ductal carcinoma of breast, estrogen receptor positive, stage 1 09/16/2012  . Pseudomonas pneumonia 5/09  . COPD (chronic obstructive pulmonary disease) 9/08  . Mastalgia 7/95  . Lung disease     secondary to [name] infection  . Hypercalcemia 09/14/2013  . Vitamin D deficiency   . Aortic root aneurysm     Past Surgical History  Procedure Laterality Date  . Lung biopsy  2002    [name], Dr [name]  . Cardiac catheterization  2007    essentially negation for significant CAD  . Appendectomy  2004  . Cholecystectomy  2001  . 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 Breast lumpectomy  1992    benign  . Mastectomy modified radical  2002    oral chemotheraphy (tamoxifen then Armidex) no radiation, Dr.[name]  . Tonsillectomy  1946  . Breast biopsy  08/2004    right breast-benign  . Colonoscopy  2014   Dr [name] , due 2019    There were no vitals filed for this visit.  Visit Diagnosis:  Difficulty walking      Subjective Assessment - 05/02/15 1419    Subjective Patient is short of breath today.        Floor to ceiling x 10 reps, side to side 10 reps, step and reach forward x 10 reps, step and reach backwards x 10, step and reach sideways x 10 , Rock and reach forward/backward x 10 , Rock and reach sideways x 10, functional tasks; 1 sit to stand 2 Pinching activities to mimic clipping her nails with clippers 3. hand coordination tasks 4 washing hair and drying hair.5. Writing tasks Max cueing needed to appropriately perform LSVT tasks with leg, hand, and head position. Fatigue with sit to stand but demonstrating more control  CGA to SBA for safety with activities.  Uses to increase intensity and amplitude of movements throughout session                         PT Education - 05/02/15 1420    Education provided Yes   Education Details LSVT BIG   Person(Bass) Educated Patient   Methods Explanation;Demonstration;Tactile cues   Comprehension Verbalized understanding;Returned demonstration  PT Long Term Goals - 04/19/15 1557    PT LONG TERM GOAL #1   Title Patient will be able to stand with unilateral upper extremity support for >  minutes in order to independently perform ADL'Bass.   Status New   PT LONG TERM GOAL #2   Title Patient will be able to reach > 12  inches without loss of balance to reduce fall risk with functional activities.   Status New   PT LONG TERM GOAL #3   Title Pt will increase 6 minute walk time by 150 feet to improve endurance and aid in improving mobility   Status New   PT LONG TERM GOAL #4   Title Patient will be independent with home exercise program for self-management of ankle symptoms/ difficulty.   Status New               Plan - 05/02/15 1420    Clinical Impression Statement Decreased coordination  demonstrated requiring consistent verbal cueing to correct form   Pt will benefit from skilled therapeutic intervention in order to improve on the following deficits Decreased coordination;Difficulty walking;Impaired tone;Decreased activity tolerance;Decreased balance;Impaired flexibility;Decreased mobility;Decreased strength   PT Frequency 4x / week   PT Treatment/Interventions Therapeutic activities;Therapeutic exercise;Gait training;Balance training   PT Next Visit Plan LSVT BIG   PT Home Exercise Plan LSVT BIG   Consulted and Agree with Plan of Care Patient        Problem List Patient Active Problem List   Diagnosis Date Noted  . Tremor 02/07/2015  . Dysphagia, pharyngoesophageal phase 02/07/2015  . Hypercalcemia 09/14/2013  . Unspecified vitamin D deficiency 04/27/2013  . Breast cancer, left breast 09/16/2012  . Osteoporosis, post-menopausal 03/14/2012  . Dyspnea 03/28/2011  . Cough 03/13/2011  . ATYPICAL MYCOBACTERIAL INFECTION 01/01/2011  . AORTIC ANEURYSM 01/01/2011  . Headache(784.0) 01/01/2011  . OSTEOARTHRITIS, CERVICAL SPINE 08/15/2009  . HYPERGLYCEMIA, FASTING 12/06/2008  . ALLERGIC RHINITIS 03/25/2008  . CHRONIC OBSTRUCTIVE ASTHMA UNSPECIFIED 03/25/2008  . BREAST CANCER, HX OF 03/10/2008  . Mixed hyperlipidemia 02/10/2007  . Essential hypertension 02/10/2007    Crystal Bass 05/02/2015, 2:22 PM  [place] MAIN [facility] SERVICES [address] [place], [place], [address] Phone: [phone]   Fax:  [phone]

## 2015-05-03 ENCOUNTER — Ambulatory Visit: Payer: Medicare Other | Admitting: Physical Therapy

## 2015-05-03 ENCOUNTER — Encounter: Payer: Self-pay | Admitting: Physical Therapy

## 2015-05-03 ENCOUNTER — Encounter: Payer: Self-pay | Admitting: Speech Pathology

## 2015-05-03 ENCOUNTER — Ambulatory Visit: Payer: Medicare Other | Admitting: Speech Pathology

## 2015-05-03 DIAGNOSIS — R262 Difficulty in walking, not elsewhere classified: Secondary | ICD-10-CM

## 2015-05-03 DIAGNOSIS — G20 Parkinson's disease: Secondary | ICD-10-CM

## 2015-05-03 DIAGNOSIS — R490 Dysphonia: Secondary | ICD-10-CM

## 2015-05-03 NOTE — Therapy (Signed)
[place] MAIN [facility] SERVICES [address] [place], [place], [address] Phone: [phone]   Fax:  [phone]  Physical Therapy Treatment  Patient Details  Name: Crystal Bass MRN: [id number] Date of Birth: 30-May-1941 Referring Provider:  Hendricks Limes, MD  Encounter Date: 05/03/2015      PT End of Session - 05/04/15 1435    Visit Number 7   Number of Visits 17   Date for PT Re-Evaluation 05/25/15   PT Start Time 0200   PT Stop Time 0255   PT Time Calculation (min) 55 min   Activity Tolerance Patient tolerated treatment well   Behavior During Therapy [facility] for tasks assessed/performed;Anxious      Past Medical History  Diagnosis Date  . Pancreatitis     secondary to Cholelithiasis  . Breast cancer 2002    infiltrative ductal carcinoma   . Lesion of breast 1992    right, benign  . Post-thoracotomy pain syndrome   . Uterine fibroid   . Endometriosis   . Ovarian cancer 2004  . Aneurysm of aorta     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  . Osteoporosis, post-menopausal 03/14/2012  . Ductal carcinoma of breast, estrogen receptor positive, stage 1 09/16/2012  . Pseudomonas pneumonia 5/09  . COPD (chronic obstructive pulmonary disease) 9/08  . Mastalgia 7/95  . Lung disease     secondary to [name] infection  . Hypercalcemia 09/14/2013  . Vitamin D deficiency   . Aortic root aneurysm     Past Surgical History  Procedure Laterality Date  . Lung biopsy  2002    [name], Dr [name]  . Cardiac catheterization  2007    essentially negation for significant CAD  . Appendectomy  2004  . Cholecystectomy  2001  . 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 Breast lumpectomy  1992    benign  . Mastectomy modified radical  2002    oral chemotheraphy (tamoxifen then Armidex) no radiation, Dr.[name]  . Tonsillectomy  1946  . Breast biopsy  08/2004    right breast-benign  . Colonoscopy  2014     Dr [name] , due 2019    There were no vitals filed for this visit.  Visit Diagnosis:  Difficulty walking      Subjective Assessment - 05/04/15 1429    Subjective Patient is short of breath today.          Floor to ceiling x 10 reps, side to side 10 reps, step and reach forward x 10 reps, step and reach backwards x 10, step and reach sideways x 10 , Rock and reach forward/backward x 10 , Rock and reach sideways x 10, functional tasks; 1 sit to stand 2 Pinching activities to mimic clipping her nails with clippers 3. hand coordination tasks 4 washing hair and drying hair.5. Writing tasks Max cueing needed to appropriately perform LSVT tasks with leg, hand, and head position. Fatigue with sit to stand but demonstrating more control  CGA to SBA for safety with activities. Uses to increase intensity and amplitude of movements throughout session Patients performance improves with practice and she uses UE to help support and for balance. Patient needs cuing to consistently swing her arms and also swing reciprocally.                        PT Education - 05/04/15 1418  Education Details LSVT BIG   Person(s) Educated Patient   Methods Explanation;Demonstration   Comprehension Verbalized understanding;Returned demonstration             PT Long Term Goals - 04/19/15 1557    PT LONG TERM GOAL #1   Title Patient will be able to stand with unilateral upper extremity support for >  minutes in order to independently perform ADL's.   Status New   PT LONG TERM GOAL #2   Title Patient will be able to reach > 12  inches without loss of balance to reduce fall risk with functional activities.   Status New   PT LONG TERM GOAL #3   Title Pt will increase 6 minute walk time by 150 feet to improve endurance and aid in improving mobility   Status New   PT LONG TERM GOAL #4   Title Patient will be independent with home exercise program for self-management of ankle  symptoms/ difficulty.   Status New               Plan - 05/04/15 1436    Clinical Impression Statement Patient needs cueing for BIG swing and BIG amplitude.     Pt will benefit from skilled therapeutic intervention in order to improve on the following deficits Decreased coordination;Difficulty walking;Impaired tone;Decreased activity tolerance;Decreased balance;Impaired flexibility;Decreased mobility;Decreased strength   Rehab Potential Good   PT Frequency 4x / week   PT Duration 4 weeks   PT Treatment/Interventions Therapeutic activities;Therapeutic exercise;Gait training;Balance training   PT Next Visit Plan LSVT BIG   PT Home Exercise Plan LSVT BIG   Consulted and Agree with Plan of Care Patient        Problem List Patient Active Problem List   Diagnosis Date Noted  . Tremor 02/07/2015  . Dysphagia, pharyngoesophageal phase 02/07/2015  . Hypercalcemia 09/14/2013  . Unspecified vitamin D deficiency 04/27/2013  . Breast cancer, left breast 09/16/2012  . Osteoporosis, post-menopausal 03/14/2012  . Dyspnea 03/28/2011  . Cough 03/13/2011  . ATYPICAL MYCOBACTERIAL INFECTION 01/01/2011  . AORTIC ANEURYSM 01/01/2011  . Headache(784.0) 01/01/2011  . OSTEOARTHRITIS, CERVICAL SPINE 08/15/2009  . HYPERGLYCEMIA, FASTING 12/06/2008  . ALLERGIC RHINITIS 03/25/2008  . CHRONIC OBSTRUCTIVE ASTHMA UNSPECIFIED 03/25/2008  . BREAST CANCER, HX OF 03/10/2008  . Mixed hyperlipidemia 02/10/2007  . Essential hypertension 02/10/2007    Alanson Puls 05/04/2015, 2:42 PM  [place] MAIN [facility] SERVICES [address] [place], [place], [address] Phone: [phone]   Fax:  [phone]

## 2015-05-03 NOTE — Therapy (Signed)
[place] MAIN [facility] SERVICES [place], [place], [address] Phone: [phone]   Fax:  [phone]  Speech Language Pathology Treatment  Patient Details  Name: Crystal Bass MRN: [id number] Date of Birth: [birth year] Referring Provider:  Hendricks Limes, MD  Encounter Date: 05/02/2015      End of Session - 05/03/15 0911    Visit Number 6   Number of Visits 17   Date for SLP Re-Evaluation 05/25/15   SLP Start Time 1500   SLP Stop Time  7619   SLP Time Calculation (min) 55 min   Activity Tolerance Patient tolerated treatment well      Past Medical History  Diagnosis Date  . Pancreatitis     secondary to Cholelithiasis  . Breast cancer 2002    infiltrative ductal carcinoma   . Lesion of breast 1992    right, benign  . Post-thoracotomy pain syndrome   . Uterine fibroid   . Endometriosis   . Ovarian cancer 2004  . Aneurysm of aorta     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  . Osteoporosis, post-menopausal 03/14/2012  . Ductal carcinoma of breast, estrogen receptor positive, stage 1 09/16/2012  . Pseudomonas pneumonia 5/09  . COPD (chronic obstructive pulmonary disease) 9/08  . Mastalgia 7/95  . Lung disease     secondary to [name] infection  . Hypercalcemia 09/14/2013  . Vitamin D deficiency   . Aortic root aneurysm     Past Surgical History  Procedure Laterality Date  . Lung biopsy  2002    [name], Dr [name]  . Cardiac catheterization  2007    essentially negation for significant CAD  . Appendectomy  2004  . Cholecystectomy  2001  . 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 Breast lumpectomy  1992    benign  . Mastectomy modified radical  2002    oral chemotheraphy (tamoxifen then Armidex) no radiation, Dr.[name]  . Tonsillectomy  1946  . Breast biopsy  08/2004    right breast-benign  . Colonoscopy  2014    Dr [name] , due 2019    There were no vitals filed  for this visit.  Visit Diagnosis: Dysphonia  Parkinson disease      Subjective Assessment - 05/03/15 0911    Subjective The patient reports that she is becoming more comfortable with her loud voice   Currently in Pain? No/denies           SLP Evaluation [facility] - 05/03/15 0001    SLP Visit Information   SLP Received On 05/02/15   Plan   Duration Other (comment)            ADULT SLP TREATMENT - 05/03/15 0001    Treatment Provided   Treatment provided Cognitive-Linquistic   Pain Assessment   Pain Assessment No/denies pain   Cognitive-Linquistic Treatment   Treatment focused on Other (comment)  LSVT LOUD   Skilled Treatment Daily Task #1: Average 9 seconds, 83 dB. Daily Task 2: Highs: 15 high pitched "ah" given min-mod cues. Lows: 15 low pitched "ah" given min-mod cues. Daily task #3: Average 75 dB.  Hierarchal speech loudness drill: Read sentences, 75 dB. Generate sentences to answer questions, 70 dB.  Homework: assignments completed.  Off the cuff remarks: average 70 dB; up to 76 dB given reminders to be loud.     Assessment / Recommendations / Plan  Plan Continue with current plan of care   Progression Toward Goals   Progression toward goals Progressing toward goals          SLP Education - 05/03/15 0911    Education provided Yes   Education Details LSVT LOUD   Person(s) Educated Patient   Methods Explanation;Demonstration;Verbal cues;Handout   Comprehension Verbalized understanding;Returned demonstration            SLP Long Term Goals - 04/19/15 1634    SLP LONG TERM GOAL #1   Title The patient will complete Daily Tasks (Maximum duration "ah", High/Lows, and Functional Phrases) at average loudness of 80 dB and with loud, good quality voice.    Time 5   Period Weeks   Status New   SLP LONG TERM GOAL #2   Title The patient will complete Hierarchal Speech Loudness reading drills (words/phrases, sentences, and paragraph) at average 75 dB and with loud,  good quality voice.     Time 5   Period Weeks   Status New   SLP LONG TERM GOAL #3   Title The patient will complete homework daily.   Time 5   Period Weeks   Status New   SLP LONG TERM GOAL #4   Title The patient will participate in conversation, maintaining average loudness of 75 dB and loud, good quality voice.   Time 5   Period Weeks   Status New          Plan - 05/03/15 0912    Clinical Impression Statement The patient is completing daily tasks and hierarchal speech drill tasks with loud, good quality voice given min SLP cues.  She is demonstrating increased generalization into conversation.     Speech Therapy Frequency 4x / week   Duration Other (comment)   Treatment/Interventions Other (comment)  LSVT LOUD   Potential to Achieve Goals Good   Potential Considerations Ability to learn/carryover information;Cooperation/participation level;Previous level of function;Severity of impairments;Family/community support   SLP Home Exercise Plan LSVT-LOUD homework   Consulted and Agree with Plan of Care Patient        Problem List Patient Active Problem List   Diagnosis Date Noted  . Tremor 02/07/2015  . Dysphagia, pharyngoesophageal phase 02/07/2015  . Hypercalcemia 09/14/2013  . Unspecified vitamin D deficiency 04/27/2013  . Breast cancer, left breast 09/16/2012  . Osteoporosis, post-menopausal 03/14/2012  . Dyspnea 03/28/2011  . Cough 03/13/2011  . ATYPICAL MYCOBACTERIAL INFECTION 01/01/2011  . AORTIC ANEURYSM 01/01/2011  . Headache(784.0) 01/01/2011  . OSTEOARTHRITIS, CERVICAL SPINE 08/15/2009  . HYPERGLYCEMIA, FASTING 12/06/2008  . ALLERGIC RHINITIS 03/25/2008  . CHRONIC OBSTRUCTIVE ASTHMA UNSPECIFIED 03/25/2008  . BREAST CANCER, HX OF 03/10/2008  . Mixed hyperlipidemia 02/10/2007  . Essential hypertension 02/10/2007   Leroy Sea, MS/CCC- SLP  Lou Miner 05/03/2015, 9:14 AM  [place] MAIN [facility]  SERVICES [address] [place], [place], [address] Phone: [phone]   Fax:  [phone]

## 2015-05-04 ENCOUNTER — Ambulatory Visit: Payer: Medicare Other | Admitting: Physical Therapy

## 2015-05-04 ENCOUNTER — Ambulatory Visit: Payer: Medicare Other | Admitting: Speech Pathology

## 2015-05-04 ENCOUNTER — Encounter: Payer: Self-pay | Admitting: Speech Pathology

## 2015-05-04 ENCOUNTER — Encounter: Payer: Self-pay | Admitting: Physical Therapy

## 2015-05-04 DIAGNOSIS — R490 Dysphonia: Secondary | ICD-10-CM | POA: Diagnosis not present

## 2015-05-04 DIAGNOSIS — R262 Difficulty in walking, not elsewhere classified: Secondary | ICD-10-CM

## 2015-05-04 DIAGNOSIS — G20 Parkinson's disease: Secondary | ICD-10-CM

## 2015-05-04 NOTE — Therapy (Signed)
[place] MAIN [facility] SERVICES [address] [place], [place], [address] Phone: [phone]   Fax:  [phone]  Physical Therapy Treatment  Patient Details  Name: Crystal Bass MRN: [id number] Date of Birth: 03/11/1941 Referring Provider:  Hendricks Limes, MD  Encounter Date: 05/04/2015      PT End of Session - 05/04/15 1418    Visit Number 8   Number of Visits 17   Date for PT Re-Evaluation 05/25/15   PT Start Time 0200   PT Stop Time 0255   PT Time Calculation (min) 55 min   Activity Tolerance Patient tolerated treatment well   Behavior During Therapy [facility] for tasks assessed/performed;Anxious      Past Medical History  Diagnosis Date  . Pancreatitis     secondary to Cholelithiasis  . Breast cancer 2002    infiltrative ductal carcinoma   . Lesion of breast 1992    right, benign  . Post-thoracotomy pain syndrome   . Uterine fibroid   . Endometriosis   . Ovarian cancer 2004  . Aneurysm of aorta     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  . Osteoporosis, post-menopausal 03/14/2012  . Ductal carcinoma of breast, estrogen receptor positive, stage 1 09/16/2012  . Pseudomonas pneumonia 5/09  . COPD (chronic obstructive pulmonary disease) 9/08  . Mastalgia 7/95  . Lung disease     secondary to [name] infection  . Hypercalcemia 09/14/2013  . Vitamin D deficiency   . Aortic root aneurysm     Past Surgical History  Procedure Laterality Date  . Lung biopsy  2002    [name], Dr [name]  . Cardiac catheterization  2007    essentially negation for significant CAD  . Appendectomy  2004  . Cholecystectomy  2001  . 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 Breast lumpectomy  1992    benign  . Mastectomy modified radical  2002    oral chemotheraphy (tamoxifen then Armidex) no radiation, Dr.[name]  . Tonsillectomy  1946  . Breast biopsy  08/2004    right breast-benign  . Colonoscopy  2014     Dr [name] , due 2019    There were no vitals filed for this visit.  Visit Diagnosis:  Difficulty walking      Subjective Assessment - 05/04/15 1418    Subjective Patient is short of breath today.          Floor to ceiling x 10 reps, side to side 10 reps, step and reach forward x 10 reps, step and reach backwards x 10, step and reach sideways x 10 , Rock and reach forward/backward x 10 , Rock and reach sideways x 10, functional tasks; 1 sit to stand 2 Pinching activities to mimic clipping her nails with clippers 3. hand coordination tasks 4 washing hair and drying hair.5. Writing tasks Max cueing needed to appropriately perform LSVT tasks with leg, hand, and head position. Fatigue with sit to stand but demonstrating more control  CGA to SBA for safety with activities. Uses to increase intensity and amplitude of movements throughout session Patient is able to perform his exercises with modeling and verbal correction and cueing to step in the correct position                        PT Education - 05/04/15 1418    Education Details LSVT BIG  Person(s) Educated Patient   Methods Explanation;Demonstration   Comprehension Verbalized understanding;Returned demonstration             PT Long Term Goals - 04/19/15 1557    PT LONG TERM GOAL #1   Title Patient will be able to stand with unilateral upper extremity support for >  minutes in order to independently perform ADL's.   Status New   PT LONG TERM GOAL #2   Title Patient will be able to reach > 12  inches without loss of balance to reduce fall risk with functional activities.   Status New   PT LONG TERM GOAL #3   Title Pt will increase 6 minute walk time by 150 feet to improve endurance and aid in improving mobility   Status New   PT LONG TERM GOAL #4   Title Patient will be independent with home exercise program for self-management of ankle symptoms/ difficulty.   Status New                Plan - 05/04/15 1419    Clinical Impression Statement  Patient has unsteady stepping with several exercises but his motor control improve with practice. Patient needs occasional verbal cueing to improve posture and cueing to correctly perform exercises slowly, holding at end of range to increase motor firing of desired muscle to encourage fatigue   Pt will benefit from skilled therapeutic intervention in order to improve on the following deficits Decreased coordination;Difficulty walking;Impaired tone;Decreased activity tolerance;Decreased balance;Impaired flexibility;Decreased mobility;Decreased strength   PT Frequency 4x / week   PT Duration 4 weeks   PT Treatment/Interventions Therapeutic activities;Therapeutic exercise;Gait training;Balance training   PT Next Visit Plan LSVT BIG   PT Home Exercise Plan LSVT BIG   Consulted and Agree with Plan of Care Patient        Problem List Patient Active Problem List   Diagnosis Date Noted  . Tremor 02/07/2015  . Dysphagia, pharyngoesophageal phase 02/07/2015  . Hypercalcemia 09/14/2013  . Unspecified vitamin D deficiency 04/27/2013  . Breast cancer, left breast 09/16/2012  . Osteoporosis, post-menopausal 03/14/2012  . Dyspnea 03/28/2011  . Cough 03/13/2011  . ATYPICAL MYCOBACTERIAL INFECTION 01/01/2011  . AORTIC ANEURYSM 01/01/2011  . Headache(784.0) 01/01/2011  . OSTEOARTHRITIS, CERVICAL SPINE 08/15/2009  . HYPERGLYCEMIA, FASTING 12/06/2008  . ALLERGIC RHINITIS 03/25/2008  . CHRONIC OBSTRUCTIVE ASTHMA UNSPECIFIED 03/25/2008  . BREAST CANCER, HX OF 03/10/2008  . Mixed hyperlipidemia 02/10/2007  . Essential hypertension 02/10/2007    Alanson Puls 05/04/2015, 2:23 PM  [place] MAIN [facility] SERVICES [address] [place], [place], [address] Phone: [phone]   Fax:  [phone]

## 2015-05-04 NOTE — Therapy (Signed)
[place] MAIN [facility] SERVICES [place], [place], [address] Phone: [phone]   Fax:  [phone]  Speech Language Pathology Treatment  Patient Details  Name: Crystal Bass MRN: [id number] Date of Birth: [birth year] Referring Provider:  Hendricks Limes, MD  Encounter Date: 05/03/2015      End of Session - 05/04/15 0848    Visit Number 7   Number of Visits 17   Date for SLP Re-Evaluation 05/25/15   SLP Start Time 1500   SLP Stop Time  66   SLP Time Calculation (min) 56 min   Activity Tolerance Patient tolerated treatment well      Past Medical History  Diagnosis Date  . Pancreatitis     secondary to Cholelithiasis  . Breast cancer 2002    infiltrative ductal carcinoma   . Lesion of breast 1992    right, benign  . Post-thoracotomy pain syndrome   . Uterine fibroid   . Endometriosis   . Ovarian cancer 2004  . Aneurysm of aorta     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  . Osteoporosis, post-menopausal 03/14/2012  . Ductal carcinoma of breast, estrogen receptor positive, stage 1 09/16/2012  . Pseudomonas pneumonia 5/09  . COPD (chronic obstructive pulmonary disease) 9/08  . Mastalgia 7/95  . Lung disease     secondary to [name] infection  . Hypercalcemia 09/14/2013  . Vitamin D deficiency   . Aortic root aneurysm     Past Surgical History  Procedure Laterality Date  . Lung biopsy  2002    [name], Dr [name]  . Cardiac catheterization  2007    essentially negation for significant CAD  . Appendectomy  2004  . Cholecystectomy  2001  . 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 Breast lumpectomy  1992    benign  . Mastectomy modified radical  2002    oral chemotheraphy (tamoxifen then Armidex) no radiation, Dr.[name]  . Tonsillectomy  1946  . Breast biopsy  08/2004    right breast-benign  . Colonoscopy  2014    Dr [name] , due 2019    There were no vitals filed  for this visit.  Visit Diagnosis: Dysphonia  Parkinson disease      Subjective Assessment - 05/04/15 0847    Subjective The patient reports that she is feeling better today and demonstrates improved vocal qualtiy   Currently in Pain? No/denies               ADULT SLP TREATMENT - 05/04/15 0001    Treatment Provided   Treatment provided Cognitive-Linquistic   Pain Assessment   Pain Assessment No/denies pain   Cognitive-Linquistic Treatment   Treatment focused on Other (comment)  LSVT LOUD   Skilled Treatment Daily Task #1: Average 10 seconds, 83 dB. Daily Task 2: Highs: 15 high pitched "ah" given min-mod cues. Lows: 15 low pitched "ah" given min-mod cues. Daily task #3: Average 75 dB.  Hierarchal speech loudness drill: Read sentences, 75 dB. Conversational speech, 70 dB.  Homework: assignments completed.  Off the cuff remarks: average 70 dB; up to 76 dB self-cued.     Assessment / Recommendations / Plan   Plan Continue with current plan of care   Progression Toward Goals   Progression toward goals Progressing toward goals          SLP Education - 05/04/15 0848    Education  provided Yes   Education Details LSVT LOUD   Person(s) Educated Patient   Methods Explanation;Demonstration;Verbal cues   Comprehension Verbalized understanding;Returned demonstration            SLP Long Term Goals - 04/19/15 1634    SLP LONG TERM GOAL #1   Title The patient will complete Daily Tasks (Maximum duration "ah", High/Lows, and Functional Phrases) at average loudness of 80 dB and with loud, good quality voice.    Time 5   Period Weeks   Status New   SLP LONG TERM GOAL #2   Title The patient will complete Hierarchal Speech Loudness reading drills (words/phrases, sentences, and paragraph) at average 75 dB and with loud, good quality voice.     Time 5   Period Weeks   Status New   SLP LONG TERM GOAL #3   Title The patient will complete homework daily.   Time 5   Period Weeks    Status New   SLP LONG TERM GOAL #4   Title The patient will participate in conversation, maintaining average loudness of 75 dB and loud, good quality voice.   Time 5   Period Weeks   Status New          Plan - 05/04/15 0849    Clinical Impression Statement The patient is completing daily tasks and hierarchal speech drill tasks with loud, good quality voice given min SLP cues.  She is demonstrating increased self monitoring and generalization into conversation.     Speech Therapy Frequency 4x / week   Duration Other (comment)   Treatment/Interventions Other (comment)  LSVT LOUD   Potential to Achieve Goals Good   Potential Considerations Ability to learn/carryover information;Cooperation/participation level;Previous level of function;Severity of impairments;Family/community support   SLP Home Exercise Plan LSVT-LOUD homework   Consulted and Agree with Plan of Care Patient        Problem List Patient Active Problem List   Diagnosis Date Noted  . Tremor 02/07/2015  . Dysphagia, pharyngoesophageal phase 02/07/2015  . Hypercalcemia 09/14/2013  . Unspecified vitamin D deficiency 04/27/2013  . Breast cancer, left breast 09/16/2012  . Osteoporosis, post-menopausal 03/14/2012  . Dyspnea 03/28/2011  . Cough 03/13/2011  . ATYPICAL MYCOBACTERIAL INFECTION 01/01/2011  . AORTIC ANEURYSM 01/01/2011  . Headache(784.0) 01/01/2011  . OSTEOARTHRITIS, CERVICAL SPINE 08/15/2009  . HYPERGLYCEMIA, FASTING 12/06/2008  . ALLERGIC RHINITIS 03/25/2008  . CHRONIC OBSTRUCTIVE ASTHMA UNSPECIFIED 03/25/2008  . BREAST CANCER, HX OF 03/10/2008  . Mixed hyperlipidemia 02/10/2007  . Essential hypertension 02/10/2007   Leroy Sea, MS/CCC- SLP  Lou Miner 05/04/2015, 8:50 AM  [place] MAIN [facility] SERVICES [address] [place], [place], [address] Phone: [phone]   Fax:  [phone]

## 2015-05-05 ENCOUNTER — Encounter: Payer: Self-pay | Admitting: Speech Pathology

## 2015-05-05 ENCOUNTER — Encounter: Payer: Self-pay | Admitting: Physical Therapy

## 2015-05-05 ENCOUNTER — Ambulatory Visit: Payer: Medicare Other | Admitting: Speech Pathology

## 2015-05-05 ENCOUNTER — Ambulatory Visit: Payer: Medicare Other | Admitting: Physical Therapy

## 2015-05-05 DIAGNOSIS — R490 Dysphonia: Secondary | ICD-10-CM

## 2015-05-05 DIAGNOSIS — R262 Difficulty in walking, not elsewhere classified: Secondary | ICD-10-CM

## 2015-05-05 DIAGNOSIS — G20 Parkinson's disease: Secondary | ICD-10-CM

## 2015-05-05 DIAGNOSIS — G20A1 Parkinson's disease without dyskinesia, without mention of fluctuations: Secondary | ICD-10-CM

## 2015-05-05 NOTE — Therapy (Signed)
[place] MAIN [facility] SERVICES [address] [place], [place], [address] Phone: [phone]   Fax:  [phone]  Speech Language Pathology Treatment  Patient Details  Name: Crystal Bass MRN: [id number] Date of Birth: May 15, 1941 Referring Provider:  Hendricks Limes, MD  Encounter Date: 05/04/2015      End of Session - 05/05/15 1005    Visit Number 8   Number of Visits 17   Date for SLP Re-Evaluation 05/25/15   SLP Start Time 1500   SLP Stop Time  22   SLP Time Calculation (min) 56 min   Activity Tolerance Patient tolerated treatment well      Past Medical History  Diagnosis Date  . Pancreatitis     secondary to Cholelithiasis  . Breast cancer 2002    infiltrative ductal carcinoma   . Lesion of breast 1992    right, benign  . Post-thoracotomy pain syndrome   . Uterine fibroid   . Endometriosis   . Ovarian cancer 2004  . Aneurysm of aorta     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  . Osteoporosis, post-menopausal 03/14/2012  . Ductal carcinoma of breast, estrogen receptor positive, stage 1 09/16/2012  . Pseudomonas pneumonia 5/09  . COPD (chronic obstructive pulmonary disease) 9/08  . Mastalgia 7/95  . Lung disease     secondary to [name] infection  . Hypercalcemia 09/14/2013  . Vitamin D deficiency   . Aortic root aneurysm     Past Surgical History  Procedure Laterality Date  . Lung biopsy  2002    [name], Dr [name]  . Cardiac catheterization  2007    essentially negation for significant CAD  . Appendectomy  2004  . Cholecystectomy  2001  . 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 Breast lumpectomy  1992    benign  . Mastectomy modified radical  2002    oral chemotheraphy (tamoxifen then Armidex) no radiation, Dr.[name]  . Tonsillectomy  1946  . Breast biopsy  08/2004    right breast-benign  . Colonoscopy  2014    Dr [name] , due 2019    There were no vitals filed  for this visit.  Visit Diagnosis: Dysphonia  Parkinson disease      Subjective Assessment - 05/05/15 1001    Subjective The patient states she is adjusting to using her loud voice   Patient is accompained by: Family member   Currently in Pain? No/denies          ADULT SLP TREATMENT - 05/05/15 0001    Treatment Provided   Treatment provided Cognitive-Linquistic   Pain Assessment   Pain Assessment No/denies pain   Cognitive-Linquistic Treatment   Treatment focused on Other (comment)  LSVT LOUD   Skilled Treatment Daily Task #1: Average 10 seconds, 83 dB. Daily Task 2: Highs: 15 high pitched "ah" given min-mod cues. Lows: 15 low pitched "ah" given min-mod cues. Daily task #3: Average 75 dB.  Hierarchal speech loudness drill: Read sentences, 75 dB. Conversational speech, 71 dB.  Homework: assignments completed.  Off the cuff remarks: average 70 dB; up to 76 dB self-cued.     Assessment / Recommendations / Plan   Plan Continue with current plan of care   Progression Toward Goals   Progression toward goals Progressing toward goals          SLP Education - 05/05/15 1005    Education  provided Yes   Education Details LSVT LOUD   Person(s) Educated Patient   Methods Explanation;Demonstration;Handout   Comprehension Verbalized understanding;Returned demonstration            SLP Long Term Goals - 04/19/15 1634    SLP LONG TERM GOAL #1   Title The patient will complete Daily Tasks (Maximum duration "ah", High/Lows, and Functional Phrases) at average loudness of 80 dB and with loud, good quality voice.    Time 5   Period Weeks   Status New   SLP LONG TERM GOAL #2   Title The patient will complete Hierarchal Speech Loudness reading drills (words/phrases, sentences, and paragraph) at average 75 dB and with loud, good quality voice.     Time 5   Period Weeks   Status New   SLP LONG TERM GOAL #3   Title The patient will complete homework daily.   Time 5   Period Weeks    Status New   SLP LONG TERM GOAL #4   Title The patient will participate in conversation, maintaining average loudness of 75 dB and loud, good quality voice.   Time 5   Period Weeks   Status New          Plan - 05/05/15 1006    Clinical Impression Statement The patient is completing daily tasks and hierarchal speech drill tasks with loud, good quality voice given min SLP cues.  She is demonstrating increased self monitoring and generalization into conversation.     Speech Therapy Frequency 4x / week   Duration Other (comment)   Treatment/Interventions --  LSVT LOUD   Potential to Achieve Goals Good   Potential Considerations Ability to learn/carryover information;Cooperation/participation level;Previous level of function;Severity of impairments;Family/community support   SLP Home Exercise Plan LSVT-LOUD homework   Consulted and Agree with Plan of Care Patient        Problem List Patient Active Problem List   Diagnosis Date Noted  . Tremor 02/07/2015  . Dysphagia, pharyngoesophageal phase 02/07/2015  . Hypercalcemia 09/14/2013  . Unspecified vitamin D deficiency 04/27/2013  . Breast cancer, left breast 09/16/2012  . Osteoporosis, post-menopausal 03/14/2012  . Dyspnea 03/28/2011  . Cough 03/13/2011  . ATYPICAL MYCOBACTERIAL INFECTION 01/01/2011  . AORTIC ANEURYSM 01/01/2011  . Headache(784.0) 01/01/2011  . OSTEOARTHRITIS, CERVICAL SPINE 08/15/2009  . HYPERGLYCEMIA, FASTING 12/06/2008  . ALLERGIC RHINITIS 03/25/2008  . CHRONIC OBSTRUCTIVE ASTHMA UNSPECIFIED 03/25/2008  . BREAST CANCER, HX OF 03/10/2008  . Mixed hyperlipidemia 02/10/2007  . Essential hypertension 02/10/2007   Leroy Sea, MS/CCC- SLP  Lou Miner 05/05/2015, 10:08 AM  [place] MAIN [facility] SERVICES [address] [place], [place], [address] Phone: [phone]   Fax:  [phone]

## 2015-05-05 NOTE — Therapy (Signed)
[place] MAIN [facility] SERVICES [address] [place], [place], [address] Phone: [phone]   Fax:  [phone]  Physical Therapy Treatment  Patient Details  Name: Crystal Bass MRN: [id number] Date of Birth: [birth year] Referring Provider:  Hendricks Limes, MD  Encounter Date: 05/05/2015      PT End of Session - 05/05/15 1430    Visit Number 8   Number of Visits 17   Date for PT Re-Evaluation 05/25/15   PT Start Time 0155   PT Stop Time 0250   PT Time Calculation (min) 55 min   Activity Tolerance Patient tolerated treatment well   Behavior During Therapy [facility] for tasks assessed/performed;Anxious      Past Medical History  Diagnosis Date  . Pancreatitis     secondary to Cholelithiasis  . Breast cancer 2002    infiltrative ductal carcinoma   . Lesion of breast 1992    right, benign  . Post-thoracotomy pain syndrome   . Uterine fibroid   . Endometriosis   . Ovarian cancer 2004  . Aneurysm of aorta     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  . Osteoporosis, post-menopausal 03/14/2012  . Ductal carcinoma of breast, estrogen receptor positive, stage 1 09/16/2012  . Pseudomonas pneumonia 5/09  . COPD (chronic obstructive pulmonary disease) 9/08  . Mastalgia 7/95  . Lung disease     secondary to [name] infection  . Hypercalcemia 09/14/2013  . Vitamin D deficiency   . Aortic root aneurysm     Past Surgical History  Procedure Laterality Date  . Lung biopsy  2002    [name], Dr [name]  . Cardiac catheterization  2007    essentially negation for significant CAD  . Appendectomy  2004  . Cholecystectomy  2001  . 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 Breast lumpectomy  1992    benign  . Mastectomy modified radical  2002    oral chemotheraphy (tamoxifen then Armidex) no radiation, Dr.[name]  . Tonsillectomy  1946  . Breast biopsy  08/2004    right breast-benign  . Colonoscopy  2014     Dr [name] , due 2019    There were no vitals filed for this visit.  Visit Diagnosis:  Difficulty walking      Subjective Assessment - 05/05/15 1422    Subjective Patient is doing well today.    Currently in Pain? No/denies                    Floor to ceiling x 10 reps, side to side 10 reps, step and reach forward x 10 reps, step and reach backwards x 10, step and reach sideways x 10 , Rock and reach forward/backward x 10 , Rock and reach sideways x 10, functional tasks; 1 sit to stand 2 Pinching activities to mimic clipping her nails with clippers 3. hand coordination tasks 4 washing hair and drying hair.5. Writing tasks Max cueing needed to appropriately perform LSVT tasks with leg, hand, and head position. Fatigue with sit to stand but demonstrating more control CGA to SBA for safety with activities. Uses to increase intensity and amplitude of movements throughout session Patient is able to perform his exercises with modeling and verbal correction and cueing to step in the correct position               Floor to ceiling x 10 reps,  side to side 10 reps, step and reach forward x 10 reps, step and reach backwards x 10, step and reach sideways x 10 , Rock and reach forward/backward x 10 , Rock and reach sideways x 10, functional tasks; 1 sit to stand 2 Pinching activities to mimic clipping her nails with clippers 3. hand coordination tasks 4 washing hair and drying hair.5. Writing tasks Max cueing needed to appropriately perform LSVT tasks with leg, hand, and head position. Fatigue with sit to stand but demonstrating more control CGA to SBA for safety with activities. Uses to increase intensity and amplitude of movements throughout session Patient is able to perform his exercises with modeling and verbal correction and cueing to step in the correct position                              PT Education - 05/05/15 1423    Education provided Yes    Education Details LSVT BIG   Person(s) Educated Patient   Methods Explanation   Comprehension Verbalized understanding             PT Long Term Goals - 04/19/15 1557    PT LONG TERM GOAL #1   Title Patient will be able to stand with unilateral upper extremity support for >  minutes in order to independently perform ADL's.   Status New   PT LONG TERM GOAL #2   Title Patient will be able to reach > 12  inches without loss of balance to reduce fall risk with functional activities.   Status New   PT LONG TERM GOAL #3   Title Pt will increase 6 minute walk time by 150 feet to improve endurance and aid in improving mobility   Status New   PT LONG TERM GOAL #4   Title Patient will be independent with home exercise program for self-management of ankle symptoms/ difficulty.   Status New               Plan - 05/05/15 1431    Clinical Impression Statement Min cueing needed to appropriately perform LSVT tasks with leg, hand, and head position. Decreased coordination demonstrated requiring consistent verbal cueing to correct form. Cognitive understanding of task was delayed   Pt will benefit from skilled therapeutic intervention in order to improve on the following deficits Decreased coordination;Difficulty walking;Impaired tone;Decreased activity tolerance;Decreased balance;Impaired flexibility;Decreased mobility;Decreased strength   Rehab Potential Good   PT Frequency 4x / week   PT Duration 4 weeks   PT Treatment/Interventions Therapeutic activities;Therapeutic exercise;Gait training;Balance training   PT Home Exercise Plan LSVT BIG   Consulted and Agree with Plan of Care Patient        Problem List Patient Active Problem List   Diagnosis Date Noted  . Tremor 02/07/2015  . Dysphagia, pharyngoesophageal phase 02/07/2015  . Hypercalcemia 09/14/2013  . Unspecified vitamin D deficiency 04/27/2013  . Breast cancer, left breast 09/16/2012  . Osteoporosis, post-menopausal  03/14/2012  . Dyspnea 03/28/2011  . Cough 03/13/2011  . ATYPICAL MYCOBACTERIAL INFECTION 01/01/2011  . AORTIC ANEURYSM 01/01/2011  . Headache(784.0) 01/01/2011  . OSTEOARTHRITIS, CERVICAL SPINE 08/15/2009  . HYPERGLYCEMIA, FASTING 12/06/2008  . ALLERGIC RHINITIS 03/25/2008  . CHRONIC OBSTRUCTIVE ASTHMA UNSPECIFIED 03/25/2008  . BREAST CANCER, HX OF 03/10/2008  . Mixed hyperlipidemia 02/10/2007  . Essential hypertension 02/10/2007    [name] S 05/05/2015, 2:35 PM  [place] MAIN [facility] SERVICES [address]  [place], [place], [address] Phone: [phone]   Fax:  [phone]

## 2015-05-06 ENCOUNTER — Encounter: Payer: Self-pay | Admitting: Speech Pathology

## 2015-05-06 NOTE — Therapy (Signed)
[place] MAIN [facility] SERVICES [address] [place], [place], [address] Phone: [phone]   Fax:  [phone]  Speech Language Pathology Treatment  Patient Details  Name: Crystal Bass MRN: [id number] Date of Birth: [birth year] Referring Provider:  Hendricks Limes, MD  Encounter Date: 05/05/2015      End of Session - 05/06/15 1126    Visit Number 9   Number of Visits 17   Date for SLP Re-Evaluation 05/25/15   SLP Start Time 1500   SLP Stop Time  1555   SLP Time Calculation (min) 55 min   Activity Tolerance Patient tolerated treatment well      Past Medical History  Diagnosis Date  . Pancreatitis     secondary to Cholelithiasis  . Breast cancer 2002    infiltrative ductal carcinoma   . Lesion of breast 1992    right, benign  . Post-thoracotomy pain syndrome   . Uterine fibroid   . Endometriosis   . Ovarian cancer 2004  . Aneurysm of aorta     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  . Osteoporosis, post-menopausal 03/14/2012  . Ductal carcinoma of breast, estrogen receptor positive, stage 1 09/16/2012  . Pseudomonas pneumonia 5/09  . COPD (chronic obstructive pulmonary disease) 9/08  . Mastalgia 7/95  . Lung disease     secondary to [name] infection  . Hypercalcemia 09/14/2013  . Vitamin D deficiency   . Aortic root aneurysm     Past Surgical History  Procedure Laterality Date  . Lung biopsy  2002    [name], Dr [name]  . Cardiac catheterization  2007    essentially negation for significant CAD  . Appendectomy  2004  . Cholecystectomy  2001  . 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 Breast lumpectomy  1992    benign  . Mastectomy modified radical  2002    oral chemotheraphy (tamoxifen then Armidex) no radiation, Dr.[name]  . Tonsillectomy  1946  . Breast biopsy  08/2004    right breast-benign  . Colonoscopy  2014    Dr [name] , due 2019    There were no vitals filed  for this visit.  Visit Diagnosis: Parkinson disease  Dysphonia      Subjective Assessment - 05/06/15 1125    Subjective Patient reported a friend noticed she was speaking louder.   Currently in Pain? No/denies               ADULT SLP TREATMENT - 05/06/15 0001    Treatment Provided   Treatment provided Cognitive-Linquistic   Pain Assessment   Pain Assessment No/denies pain   Cognitive-Linquistic Treatment   Treatment focused on Other (comment)  LSVT LOUD   Skilled Treatment Daily Task #1: Average 9 seconds, 87 dB. Daily Task 2: Highs: 15 high pitched "ah" given min cues. Lows: 15 low pitched "ah" given min cues. Daily task #3: Average 77 dB.  Hierarchal speech loudness drill: Read sentences, 76 dB. Conversational speech, 72 dB.  Homework: assignments completed.  Off the cuff remarks: average 70 dB; up to 76 dB self-cued.     Assessment / Recommendations / Plan   Plan Continue with current plan of care   Progression Toward Goals   Progression toward goals Progressing toward goals          SLP Education - 05/06/15 1126    Education provided Yes   Education  Details LSVT BIG   Person(s) Educated Patient   Methods Explanation   Comprehension Verbalized understanding            SLP Long Term Goals - 04/19/15 1634    SLP LONG TERM GOAL #1   Title The patient will complete Daily Tasks (Maximum duration "ah", High/Lows, and Functional Phrases) at average loudness of 80 dB and with loud, good quality voice.    Time 5   Period Weeks   Status New   SLP LONG TERM GOAL #2   Title The patient will complete Hierarchal Speech Loudness reading drills (words/phrases, sentences, and paragraph) at average 75 dB and with loud, good quality voice.     Time 5   Period Weeks   Status New   SLP LONG TERM GOAL #3   Title The patient will complete homework daily.   Time 5   Period Weeks   Status New   SLP LONG TERM GOAL #4   Title The patient will participate in conversation,  maintaining average loudness of 75 dB and loud, good quality voice.   Time 5   Period Weeks   Status New          Plan - 05/06/15 1127    Clinical Impression Statement The patient is completing daily tasks and hierarchal speech drill tasks with loud, good quality voice given min SLP cues.  She is demonstrating increased self monitoring and generalization into conversation.     Speech Therapy Frequency 4x / week   Duration Other (comment)   Treatment/Interventions SLP instruction and feedback   Potential to Achieve Goals Good   Potential Considerations Ability to learn/carryover information;Cooperation/participation level;Previous level of function;Severity of impairments;Family/community support   SLP Home Exercise Plan LSVT-LOUD homework   Consulted and Agree with Plan of Care Patient        Problem List Patient Active Problem List   Diagnosis Date Noted  . Tremor 02/07/2015  . Dysphagia, pharyngoesophageal phase 02/07/2015  . Hypercalcemia 09/14/2013  . Unspecified vitamin D deficiency 04/27/2013  . Breast cancer, left breast 09/16/2012  . Osteoporosis, post-menopausal 03/14/2012  . Dyspnea 03/28/2011  . Cough 03/13/2011  . ATYPICAL MYCOBACTERIAL INFECTION 01/01/2011  . AORTIC ANEURYSM 01/01/2011  . Headache(784.0) 01/01/2011  . OSTEOARTHRITIS, CERVICAL SPINE 08/15/2009  . HYPERGLYCEMIA, FASTING 12/06/2008  . ALLERGIC RHINITIS 03/25/2008  . CHRONIC OBSTRUCTIVE ASTHMA UNSPECIFIED 03/25/2008  . BREAST CANCER, HX OF 03/10/2008  . Mixed hyperlipidemia 02/10/2007  . Essential hypertension 02/10/2007    Erma Pinto 05/06/2015, 11:29 AM  [place] MAIN [facility] SERVICES [address] [place], [place], [address] Phone: [phone]   Fax:  [phone]

## 2015-05-09 ENCOUNTER — Ambulatory Visit: Payer: Medicare Other | Admitting: Physical Therapy

## 2015-05-09 ENCOUNTER — Ambulatory Visit: Payer: Medicare Other | Admitting: Speech Pathology

## 2015-05-09 ENCOUNTER — Encounter: Payer: Self-pay | Admitting: Physical Therapy

## 2015-05-09 DIAGNOSIS — G20 Parkinson's disease: Secondary | ICD-10-CM

## 2015-05-09 DIAGNOSIS — R490 Dysphonia: Secondary | ICD-10-CM | POA: Diagnosis not present

## 2015-05-09 DIAGNOSIS — R262 Difficulty in walking, not elsewhere classified: Secondary | ICD-10-CM

## 2015-05-09 NOTE — Therapy (Signed)
[place] MAIN [facility] SERVICES [address] [place], [place], [address] Phone: [phone]   Fax:  [phone]  Physical Therapy Treatment  Patient Details  Name: Crystal Bass MRN: [id number] Date of Birth: 1941-05-27 Referring Provider:  Ludwig Clarks, DO  Encounter Date: 05/09/2015      PT End of Session - 05/09/15 1447    Visit Number 9   Number of Visits 17   Date for PT Re-Evaluation 05/25/15   PT Start Time 0305   PT Stop Time 0400   PT Time Calculation (min) 55 min   Activity Tolerance Patient tolerated treatment well   Behavior During Therapy [facility] for tasks assessed/performed;Anxious      Past Medical History  Diagnosis Date  . Pancreatitis     secondary to Cholelithiasis  . Breast cancer 2002    infiltrative ductal carcinoma   . Lesion of breast 1992    right, benign  . Post-thoracotomy pain syndrome   . Uterine fibroid   . Endometriosis   . Ovarian cancer 2004  . Aneurysm of aorta     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  . Osteoporosis, post-menopausal 03/14/2012  . Ductal carcinoma of breast, estrogen receptor positive, stage 1 09/16/2012  . Pseudomonas pneumonia 5/09  . COPD (chronic obstructive pulmonary disease) 9/08  . Mastalgia 7/95  . Lung disease     secondary to [name] infection  . Hypercalcemia 09/14/2013  . Vitamin D deficiency   . Aortic root aneurysm     Past Surgical History  Procedure Laterality Date  . Lung biopsy  2002    [name], Dr [name]  . Cardiac catheterization  2007    essentially negation for significant CAD  . Appendectomy  2004  . Cholecystectomy  2001  . 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 Breast lumpectomy  1992    benign  . Mastectomy modified radical  2002    oral chemotheraphy (tamoxifen then Armidex) no radiation, Dr.[name]  . Tonsillectomy  1946  . Breast biopsy  08/2004    right breast-benign  . Colonoscopy  2014    Dr [name] , due 2019    There were no vitals filed for this visit.  Visit Diagnosis:  Difficulty walking  Parkinson disease      Subjective Assessment - 05/09/15 1447    Subjective Patient says that she has had a migraine for 3 days and is not feeling all that well.      5 x sit to stand   8.62  sec   10 MW  1.3msec   TUG n    5.10        TUG carry 5.56 sec sec     TUG cognitive 4.98      6 MW   1300 feet    Peg left 20.23 sec  Peg right 17.30 sec    Floor to ceiling x 10 reps, side to side 5 reps, step and reach forward x 10 reps, step and reach backwards x 10, step and reach sideways x 10 , Rock and reach forward/backward x 10 , Rock and reach sideways x 10, functional tasks; 1 sit to stand 2 Pinching activities to mimic clipping her nails with clippers 3. hand coordination tasks 4 washing hair and drying hair.5. Writing tasks Max cueing needed to appropriately perform LSVT tasks with leg, hand, and head position. Fatigue with sit  to stand but demonstrating more control CGA to SBA for safety with activities. Uses to increase intensity and amplitude of movements throughout session Patient is able to perform his exercises with modeling and verbal correction and cueing to step in the correct position                               PT Education - 2015/06/05 1447    Education provided Yes   Education Details LSVY BIG   Person(s) Educated Patient   Methods Explanation   Comprehension Verbalized understanding;Returned demonstration;Verbal cues required             PT Long Term Goals - 06/05/2015 1450    PT LONG TERM GOAL #1   Status Partially Met   PT LONG TERM GOAL #2   Status Partially Met   PT LONG TERM GOAL #3   Status Partially Met   PT LONG TERM GOAL #4   Status Partially Met               Plan - Jun 05, 2015 1448    Clinical Impression Statement .Min cueing needed to appropriately perform LSVT tasks with leg, hand, and head position.  Patient is progressing with all outcome measures.    Rehab Potential Good   PT Frequency 4x / week   PT Duration 4 weeks   PT Treatment/Interventions Therapeutic activities;Therapeutic exercise;Gait training;Balance training   PT Next Visit Plan LSVT BIG   PT Home Exercise Plan LSVT BIG   Consulted and Agree with Plan of Care Patient          G-Codes - 2015-06-05 1452    Functional Assessment Tool Used TUG, 10 MW, 6 MW, 6 MW, Merrilee Jansky   Functional Limitation Mobility: Walking and moving around   Mobility: Walking and Moving Around Current Status [phone]) At least 1 percent but less than 20 percent impaired, limited or restricted   Mobility: Walking and Moving Around Goal Status [phone]) At least 1 percent but less than 20 percent impaired, limited or restricted      Problem List Patient Active Problem List   Diagnosis Date Noted  . Tremor 02/07/2015  . Dysphagia, pharyngoesophageal phase 02/07/2015  . Hypercalcemia 09/14/2013  . Unspecified vitamin D deficiency 04/27/2013  . Breast cancer, left breast 09/16/2012  . Osteoporosis, post-menopausal 03/14/2012  . Dyspnea 03/28/2011  . Cough 03/13/2011  . ATYPICAL MYCOBACTERIAL INFECTION 01/01/2011  . AORTIC ANEURYSM 01/01/2011  . Headache(784.0) 01/01/2011  . OSTEOARTHRITIS, CERVICAL SPINE 08/15/2009  . HYPERGLYCEMIA, FASTING 12/06/2008  . ALLERGIC RHINITIS 03/25/2008  . CHRONIC OBSTRUCTIVE ASTHMA UNSPECIFIED 03/25/2008  . BREAST CANCER, HX OF 03/10/2008  . Mixed hyperlipidemia 02/10/2007  . Essential hypertension 02/10/2007    Alanson Puls Jun 05, 2015, 3:02 PM  [place] MAIN [facility] SERVICES [address] [place], [place], [address] Phone: [phone]   Fax:  [phone]

## 2015-05-10 ENCOUNTER — Ambulatory Visit: Payer: Medicare Other | Admitting: Physical Therapy

## 2015-05-10 ENCOUNTER — Ambulatory Visit: Payer: Medicare Other | Admitting: Speech Pathology

## 2015-05-10 ENCOUNTER — Encounter: Payer: Self-pay | Admitting: Speech Pathology

## 2015-05-10 NOTE — Therapy (Signed)
[place] MAIN [facility] SERVICES [place], [place], [address] Phone: [phone]   Fax:  [phone]  Speech Language Pathology Treatment  Patient Details  Name: Crystal Bass MRN: [id number] Date of Birth: 1941/11/10 Referring Provider:  Hendricks Limes, MD  Encounter Date: 05/09/2015      End of Session - 05/10/15 0819    Visit Number 10   Number of Visits 17   Date for SLP Re-Evaluation 05/25/15   SLP Start Time 0305   SLP Stop Time  0400   SLP Time Calculation (min) 55 min   Activity Tolerance Patient tolerated treatment well      Past Medical History  Diagnosis Date  . Pancreatitis     secondary to Cholelithiasis  . Breast cancer 2002    infiltrative ductal carcinoma   . Lesion of breast 1992    right, benign  . Post-thoracotomy pain syndrome   . Uterine fibroid   . Endometriosis   . Ovarian cancer 2004  . Aneurysm of aorta     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  . Osteoporosis, post-menopausal 03/14/2012  . Ductal carcinoma of breast, estrogen receptor positive, stage 1 09/16/2012  . Pseudomonas pneumonia 5/09  . COPD (chronic obstructive pulmonary disease) 9/08  . Mastalgia 7/95  . Lung disease     secondary to [name] infection  . Hypercalcemia 09/14/2013  . Vitamin D deficiency   . Aortic root aneurysm     Past Surgical History  Procedure Laterality Date  . Lung biopsy  2002    [name], Dr [name]  . Cardiac catheterization  2007    essentially negation for significant CAD  . Appendectomy  2004  . Cholecystectomy  2001  . 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 Breast lumpectomy  1992    benign  . Mastectomy modified radical  2002    oral chemotheraphy (tamoxifen then Armidex) no radiation, Dr.[name]  . Tonsillectomy  1946  . Breast biopsy  08/2004    right breast-benign  . Colonoscopy  2014    Dr [name] , due 2019    There were no vitals  filed for this visit.  Visit Diagnosis: Parkinson disease  Dysphonia      Subjective Assessment - 05/10/15 0815    Subjective Patient reported she feels that loudness is less effortful for her.   Currently in Pain? No/denies               ADULT SLP TREATMENT - 05/10/15 0001    Treatment Provided   Treatment provided Cognitive-Linquistic   Pain Assessment   Pain Assessment No/denies pain   Cognitive-Linquistic Treatment   Treatment focused on Other (comment)  LSVT LOUD   Skilled Treatment Daily Task #1: Average 9 seconds, 86 dB. Daily Task 2: Highs: 15 high pitched "ah" given min cues. Lows: 15 low pitched "ah" given min cues. Daily task #3: Average 75 dB.  Hierarchal speech loudness drill: Read sentences, 75 dB. Conversational speech, 73 dB.  Homework: assignments completed.  Off the cuff remarks: average 70 dB; up to 75 dB self-cued.     Assessment / Recommendations / Plan   Plan Continue with current plan of care   Progression Toward Goals   Progression toward goals Progressing toward goals          SLP Education - 05/10/15 0817    Education provided Yes  Education Details LSVT LOUD   Person(s) Educated Patient   Methods Explanation;Demonstration   Comprehension Verbalized understanding;Returned demonstration            SLP Long Term Goals - 04/19/15 1634    SLP LONG TERM GOAL #1   Title The patient will complete Daily Tasks (Maximum duration "ah", High/Lows, and Functional Phrases) at average loudness of 80 dB and with loud, good quality voice.    Time 5   Period Weeks   Status New   SLP LONG TERM GOAL #2   Title The patient will complete Hierarchal Speech Loudness reading drills (words/phrases, sentences, and paragraph) at average 75 dB and with loud, good quality voice.     Time 5   Period Weeks   Status New   SLP LONG TERM GOAL #3   Title The patient will complete homework daily.   Time 5   Period Weeks   Status New   SLP LONG TERM GOAL #4    Title The patient will participate in conversation, maintaining average loudness of 75 dB and loud, good quality voice.   Time 5   Period Weeks   Status New          Plan - 05/10/15 0820    Clinical Impression Statement The patient is completing daily tasks and hierarchal speech drill tasks with loud, good quality voice given min SLP cues.  She is demonstrating increased self monitoring and generalization into conversation.     Speech Therapy Frequency 4x / week   Duration Other (comment)   Treatment/Interventions SLP instruction and feedback   Potential to Achieve Goals Good   Potential Considerations Ability to learn/carryover information;Cooperation/participation level;Previous level of function;Severity of impairments;Family/community support   SLP Home Exercise Plan LSVT-LOUD homework   Consulted and Agree with Plan of Care Patient        Problem List Patient Active Problem List   Diagnosis Date Noted  . Tremor 02/07/2015  . Dysphagia, pharyngoesophageal phase 02/07/2015  . Hypercalcemia 09/14/2013  . Unspecified vitamin D deficiency 04/27/2013  . Breast cancer, left breast 09/16/2012  . Osteoporosis, post-menopausal 03/14/2012  . Dyspnea 03/28/2011  . Cough 03/13/2011  . ATYPICAL MYCOBACTERIAL INFECTION 01/01/2011  . AORTIC ANEURYSM 01/01/2011  . Headache(784.0) 01/01/2011  . OSTEOARTHRITIS, CERVICAL SPINE 08/15/2009  . HYPERGLYCEMIA, FASTING 12/06/2008  . ALLERGIC RHINITIS 03/25/2008  . CHRONIC OBSTRUCTIVE ASTHMA UNSPECIFIED 03/25/2008  . BREAST CANCER, HX OF 03/10/2008  . Mixed hyperlipidemia 02/10/2007  . Essential hypertension 02/10/2007    Erma Pinto 05/10/2015, 8:21 AM  [place] MAIN [facility] SERVICES [address] [place], [place], [address] Phone: [phone]   Fax:  [phone]

## 2015-05-11 ENCOUNTER — Ambulatory Visit: Payer: Medicare Other | Admitting: Speech Pathology

## 2015-05-11 DIAGNOSIS — G20 Parkinson's disease: Secondary | ICD-10-CM

## 2015-05-11 DIAGNOSIS — R490 Dysphonia: Secondary | ICD-10-CM | POA: Diagnosis not present

## 2015-05-12 ENCOUNTER — Ambulatory Visit: Payer: Medicare Other | Admitting: Physical Therapy

## 2015-05-12 ENCOUNTER — Encounter: Payer: Self-pay | Admitting: Speech Pathology

## 2015-05-12 NOTE — Therapy (Signed)
[place] MAIN [facility] SERVICES [place], [place], [address] Phone: [phone]   Fax:  [phone]  Speech Language Pathology Treatment  Patient Details  Name: Crystal Bass MRN: [id number] Date of Birth: Aug 25, 1941 Referring Provider:  Hendricks Limes, MD  Encounter Date: 05/11/2015      End of Session - 05/12/15 0807    Visit Number 11   Number of Visits 17   Date for SLP Re-Evaluation 05/25/15   SLP Start Time 0300   SLP Stop Time  0355   SLP Time Calculation (min) 55 min   Activity Tolerance Patient tolerated treatment well      Past Medical History  Diagnosis Date  . Pancreatitis     secondary to Cholelithiasis  . Breast cancer 2002    infiltrative ductal carcinoma   . Lesion of breast 1992    right, benign  . Post-thoracotomy pain syndrome   . Uterine fibroid   . Endometriosis   . Ovarian cancer 2004  . Aneurysm of aorta     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  . Osteoporosis, post-menopausal 03/14/2012  . Ductal carcinoma of breast, estrogen receptor positive, stage 1 09/16/2012  . Pseudomonas pneumonia 5/09  . COPD (chronic obstructive pulmonary disease) 9/08  . Mastalgia 7/95  . Lung disease     secondary to [name] infection  . Hypercalcemia 09/14/2013  . Vitamin D deficiency   . Aortic root aneurysm     Past Surgical History  Procedure Laterality Date  . Lung biopsy  2002    [name], Dr [name]  . Cardiac catheterization  2007    essentially negation for significant CAD  . Appendectomy  2004  . Cholecystectomy  2001  . 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 Breast lumpectomy  1992    benign  . Mastectomy modified radical  2002    oral chemotheraphy (tamoxifen then Armidex) no radiation, Dr.[name]  . Tonsillectomy  1946  . Breast biopsy  08/2004    right breast-benign  . Colonoscopy  2014    Dr [name] , due 2019    There were no vitals  filed for this visit.  Visit Diagnosis: Parkinson disease  Dysphonia      Subjective Assessment - 05/12/15 0807    Subjective Patient said she feels producing loudness is less effortful.   Currently in Pain? No/denies               ADULT SLP TREATMENT - 05/12/15 0001    Treatment Provided   Treatment provided Cognitive-Linquistic   Pain Assessment   Pain Assessment No/denies pain   Cognitive-Linquistic Treatment   Treatment focused on Other (comment)  LSVT LOUD   Skilled Treatment Daily Task #1: Average 9 seconds, 85 dB. Daily Task 2: Highs: 15 high pitched "ah" given min cues. Lows: 15 low pitched "ah" given min cues. Daily task #3: Average 76 dB.  Hierarchal speech loudness drill: Read sentences, 74 dB. Conversational speech, 70 dB.  Homework: assignments completed.  Off the cuff remarks: average 71 dB; up to 74 dB self-cued.     Assessment / Recommendations / Plan   Plan Continue with current plan of care   Progression Toward Goals   Progression toward goals Progressing toward goals          SLP Education - 05/12/15 0807    Education provided Yes   Education  Details LSVT LOUD   Person(s) Educated Patient   Methods Explanation;Demonstration   Comprehension Verbalized understanding;Returned demonstration            SLP Long Term Goals - 04/19/15 1634    SLP LONG TERM GOAL #1   Title The patient will complete Daily Tasks (Maximum duration "ah", High/Lows, and Functional Phrases) at average loudness of 80 dB and with loud, good quality voice.    Time 5   Period Weeks   Status New   SLP LONG TERM GOAL #2   Title The patient will complete Hierarchal Speech Loudness reading drills (words/phrases, sentences, and paragraph) at average 75 dB and with loud, good quality voice.     Time 5   Period Weeks   Status New   SLP LONG TERM GOAL #3   Title The patient will complete homework daily.   Time 5   Period Weeks   Status New   SLP LONG TERM GOAL #4   Title  The patient will participate in conversation, maintaining average loudness of 75 dB and loud, good quality voice.   Time 5   Period Weeks   Status New          Plan - 05/12/15 2440    Clinical Impression Statement The patient is completing daily tasks and hierarchal speech drill tasks with loud, good quality voice given min SLP cues.  She is demonstrating increased self monitoring and generalization into conversation.     Speech Therapy Frequency 4x / week   Duration Other (comment)   Treatment/Interventions SLP instruction and feedback   Potential to Achieve Goals Good   Potential Considerations Ability to learn/carryover information;Cooperation/participation level;Previous level of function;Severity of impairments;Family/community support   SLP Home Exercise Plan LSVT-LOUD homework   Consulted and Agree with Plan of Care Patient        Problem List Patient Active Problem List   Diagnosis Date Noted  . Tremor 02/07/2015  . Dysphagia, pharyngoesophageal phase 02/07/2015  . Hypercalcemia 09/14/2013  . Unspecified vitamin D deficiency 04/27/2013  . Breast cancer, left breast 09/16/2012  . Osteoporosis, post-menopausal 03/14/2012  . Dyspnea 03/28/2011  . Cough 03/13/2011  . ATYPICAL MYCOBACTERIAL INFECTION 01/01/2011  . AORTIC ANEURYSM 01/01/2011  . Headache(784.0) 01/01/2011  . OSTEOARTHRITIS, CERVICAL SPINE 08/15/2009  . HYPERGLYCEMIA, FASTING 12/06/2008  . ALLERGIC RHINITIS 03/25/2008  . CHRONIC OBSTRUCTIVE ASTHMA UNSPECIFIED 03/25/2008  . BREAST CANCER, HX OF 03/10/2008  . Mixed hyperlipidemia 02/10/2007  . Essential hypertension 02/10/2007    Erma Pinto 05/12/2015, 8:09 AM  [place] MAIN [facility] SERVICES [address] [place], [place], [address] Phone: [phone]   Fax:  [phone]

## 2015-05-13 ENCOUNTER — Encounter: Payer: Self-pay | Admitting: Speech Pathology

## 2015-05-13 ENCOUNTER — Ambulatory Visit: Payer: Medicare Other | Admitting: Speech Pathology

## 2015-05-13 ENCOUNTER — Encounter: Payer: Self-pay | Admitting: Physical Therapy

## 2015-05-13 ENCOUNTER — Ambulatory Visit: Payer: Medicare Other | Admitting: Physical Therapy

## 2015-05-13 DIAGNOSIS — G20 Parkinson's disease: Secondary | ICD-10-CM

## 2015-05-13 DIAGNOSIS — R490 Dysphonia: Secondary | ICD-10-CM

## 2015-05-13 NOTE — Therapy (Signed)
[place] MAIN [facility] SERVICES [address] [place], [place], [address] Phone: [phone]   Fax:  [phone]  Physical Therapy Treatment  Patient Details  Name: Crystal Bass MRN: [id number] Date of Birth: [birth year] Referring Provider:  Hendricks Limes, MD  Encounter Date: 05/13/2015      PT End of Session - 05/13/15 1404    Visit Number 10   Number of Visits 17   Date for PT Re-Evaluation 05/25/15   PT Start Time 3762   PT Stop Time 1501   PT Time Calculation (min) 57 min   Activity Tolerance --  Patient treatment limited by respiratory status. Pt requires rest breaks secondary to SOB.    Behavior During Therapy [facility] for tasks assessed/performed;Anxious      Past Medical History  Diagnosis Date  . Pancreatitis     secondary to Cholelithiasis  . Breast cancer 2002    infiltrative ductal carcinoma   . Lesion of breast 1992    right, benign  . Post-thoracotomy pain syndrome   . Uterine fibroid   . Endometriosis   . Ovarian cancer 2004  . Aneurysm of aorta     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  . Osteoporosis, post-menopausal 03/14/2012  . Ductal carcinoma of breast, estrogen receptor positive, stage 1 09/16/2012  . Pseudomonas pneumonia 5/09  . COPD (chronic obstructive pulmonary disease) 9/08  . Mastalgia 7/95  . Lung disease     secondary to [name] infection  . Hypercalcemia 09/14/2013  . Vitamin D deficiency   . Aortic root aneurysm     Past Surgical History  Procedure Laterality Date  . Lung biopsy  2002    [name], Dr [name]  . Cardiac catheterization  2007    essentially negation for significant CAD  . Appendectomy  2004  . Cholecystectomy  2001  . 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 Breast lumpectomy  1992    benign  . Mastectomy modified radical  2002    oral chemotheraphy (tamoxifen then Armidex) no radiation, Dr.[name]  . Tonsillectomy  1946  .  Breast biopsy  08/2004    right breast-benign  . Colonoscopy  2014    Dr [name] , due 2019    There were no vitals filed for this visit.  Visit Diagnosis:  Parkinson disease      Subjective Assessment - 05/13/15 1503    Subjective Patient states that she feels like she is getting better with being able to do her hair now after participating in the [place] program.        LSVT: Patient seen for LSVT Daily Session Maximal Daily Exercises for facilitation/coordination of movement Sustained movements are designed to rescale the amplitude of movement output for generalization to daily functional activities Performed as follows for 1 set of 10 repetitions each multidirectional sustained movements 1) Floor to ceiling 2) Side to side multidirectional Repetitive movements performed in standing and are designed to provide retraining effort needed for sustained muscle activation in tasks  Patient seen for LSVT Daily Session Maximal Daily Exercises for facilitation/coordination of movement Maximum Sustained Movements are designed to rescale the amplitude of movement output for generalization to daily functional activities Performed as follows 3) Step and reach forward 4) Step and reach backwards 5) Step and reach sideways 6) Rock and reach forward/backward 7) Rock and reach sideways Performed sit to stand functional component task  with supervision 5 reps, simulated doing her hair activity using 1 pound hand weights 3 sets of 10 reps each of over head reach and shoulder flexion with alternating elbow flex/ext while reaching to the back of her head (to simulate hair brushing), performed 10 reps of adaptive nail clipping activity, 6 reps times 3 each left hand and right hand fine motor coordination task of reaching to pick up small paper clips from container and clipping onto varying thickness of papers and 8 reps of handwriting task.   Functional mobility without assistive device for over 600' for with  initial cue for amplitude of arm movement while patient performing ABC naming activity with LOUD voice.                            PT Education - 05/13/15 1504    Education provided Yes   Education Details LSVT BIg program; discussed adding small weight for doing her hair washing daily task and manipulating paper clips from container to varying thickness stacks of paper for progression of her fine motor and nail clipping daily task exercises   Person(s) Educated Patient   Methods Explanation;Demonstration   Comprehension Verbalized understanding             PT Long Term Goals - 05/09/15 1450    PT LONG TERM GOAL #1   Status Partially Met   PT LONG TERM GOAL #2   Status Partially Met   PT LONG TERM GOAL #3   Status Partially Met   PT LONG TERM GOAL #4   Status Partially Met               Plan - 05/13/15 1509    Clinical Impression Statement Pt able to perform LSVT Big program using her handout for reference with the exception of needed cuing for technique initially with the forward rock and reach and sideways rock and reach. Pt with good ROM with LSVT program exercises and responds well to modeling to correct form.    Pt will benefit from skilled therapeutic intervention in order to improve on the following deficits Decreased coordination;Difficulty walking;Impaired tone;Decreased activity tolerance;Decreased balance;Impaired flexibility;Decreased mobility;Decreased strength   PT Next Visit Plan Continue to work on progressions of LSVT Big exercises; consider trying sit to stand on red foam mat or from lower sitting surface, dual tasks while walking and weighted hair washing exercise.    PT Home Exercise Plan LSVT BIG   Consulted and Agree with Plan of Care Patient        Problem List Patient Active Problem List   Diagnosis Date Noted  . Tremor 02/07/2015  . Dysphagia, pharyngoesophageal phase 02/07/2015  . Hypercalcemia 09/14/2013  .  Unspecified vitamin D deficiency 04/27/2013  . Breast cancer, left breast 09/16/2012  . Osteoporosis, post-menopausal 03/14/2012  . Dyspnea 03/28/2011  . Cough 03/13/2011  . ATYPICAL MYCOBACTERIAL INFECTION 01/01/2011  . AORTIC ANEURYSM 01/01/2011  . Headache(784.0) 01/01/2011  . OSTEOARTHRITIS, CERVICAL SPINE 08/15/2009  . HYPERGLYCEMIA, FASTING 12/06/2008  . ALLERGIC RHINITIS 03/25/2008  . CHRONIC OBSTRUCTIVE ASTHMA UNSPECIFIED 03/25/2008  . BREAST CANCER, HX OF 03/10/2008  . Mixed hyperlipidemia 02/10/2007  . Essential hypertension 02/10/2007   Lady Deutscher PT, DPT  Lady Deutscher 05/13/2015, 3:22 PM  [place] MAIN [facility] SERVICES [address] [place], [place], [address] Phone: [phone]   Fax:  [phone]

## 2015-05-13 NOTE — Therapy (Signed)
[place] MAIN [facility] SERVICES [place], [place], [address] Phone: [phone]   Fax:  [phone]  Speech Language Pathology Treatment  Patient Details  Name: Crystal Bass MRN: [id number] Date of Birth: 1940-11-30 Referring Provider:  Hendricks Limes, MD  Encounter Date: 05/13/2015      End of Session - 05/13/15 1608    Visit Number 12   Number of Visits 17   Date for SLP Re-Evaluation 05/25/15   SLP Start Time 32   SLP Stop Time  1600   SLP Time Calculation (min) 55 min   Activity Tolerance Patient tolerated treatment well      Past Medical History  Diagnosis Date  . Pancreatitis     secondary to Cholelithiasis  . Breast cancer 2002    infiltrative ductal carcinoma   . Lesion of breast 1992    right, benign  . Post-thoracotomy pain syndrome   . Uterine fibroid   . Endometriosis   . Ovarian cancer 2004  . Aneurysm of aorta     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  . Osteoporosis, post-menopausal 03/14/2012  . Ductal carcinoma of breast, estrogen receptor positive, stage 1 09/16/2012  . Pseudomonas pneumonia 5/09  . COPD (chronic obstructive pulmonary disease) 9/08  . Mastalgia 7/95  . Lung disease     secondary to [name] infection  . Hypercalcemia 09/14/2013  . Vitamin D deficiency   . Aortic root aneurysm     Past Surgical History  Procedure Laterality Date  . Lung biopsy  2002    [name], Dr [name]  . Cardiac catheterization  2007    essentially negation for significant CAD  . Appendectomy  2004  . Cholecystectomy  2001  . 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 Breast lumpectomy  1992    benign  . Mastectomy modified radical  2002    oral chemotheraphy (tamoxifen then Armidex) no radiation, Dr.[name]  . Tonsillectomy  1946  . Breast biopsy  08/2004    right breast-benign  . Colonoscopy  2014    Dr [name] , due 2019    There were no vitals  filed for this visit.  Visit Diagnosis: Parkinson disease  Dysphonia      Subjective Assessment - 05/13/15 1607    Subjective Patient reports she is looking forward to a restaurant outing with friends this evening.   Currently in Pain? No/denies               ADULT SLP TREATMENT - 05/13/15 1606    Treatment Provided   Treatment provided Cognitive-Linquistic   Pain Assessment   Pain Assessment No/denies pain   Cognitive-Linquistic Treatment   Treatment focused on Other (comment)  LSVT LOUD   Skilled Treatment Daily Task #1: Average 9 seconds, 86 dB. Daily Task 2: Highs: 15 high pitched "ah" given min cues. Lows: 15 low pitched "ah" given moderate cues for pitch. Daily task #3: Average 77 dB.  Hierarchal speech loudness drill: Read paragraphs, 75 dB. Conversational speech, 73 dB.  Homework: assignments completed.  Off the cuff remarks: average 72 dB; up to 75 dB self-cued.     Assessment / Recommendations / Plan   Plan Continue with current plan of care   Progression Toward Goals   Progression toward goals Progressing toward goals          SLP Education - 05/13/15 1607  Education provided Yes   Education Details LSVT LOUD   Person(s) Educated Patient   Methods Explanation;Demonstration   Comprehension Verbalized understanding;Returned demonstration            SLP Long Term Goals - 04/19/15 1634    SLP LONG TERM GOAL #1   Title The patient will complete Daily Tasks (Maximum duration "ah", High/Lows, and Functional Phrases) at average loudness of 80 dB and with loud, good quality voice.    Time 5   Period Weeks   Status New   SLP LONG TERM GOAL #2   Title The patient will complete Hierarchal Speech Loudness reading drills (words/phrases, sentences, and paragraph) at average 75 dB and with loud, good quality voice.     Time 5   Period Weeks   Status New   SLP LONG TERM GOAL #3   Title The patient will complete homework daily.   Time 5   Period Weeks    Status New   SLP LONG TERM GOAL #4   Title The patient will participate in conversation, maintaining average loudness of 75 dB and loud, good quality voice.   Time 5   Period Weeks   Status New          Plan - 05/13/15 1608    Clinical Impression Statement The patient is completing daily tasks and hierarchal speech drill tasks with loud, good quality voice given min SLP cues.  She is demonstrating increased self monitoring and generalization into conversation.     Speech Therapy Frequency 4x / week   Duration Other (comment)   Treatment/Interventions SLP instruction and feedback   Potential to Achieve Goals Good   Potential Considerations Ability to learn/carryover information;Cooperation/participation level;Previous level of function;Severity of impairments;Family/community support   SLP Home Exercise Plan LSVT-LOUD homework   Consulted and Agree with Plan of Care Patient        Problem List Patient Active Problem List   Diagnosis Date Noted  . Tremor 02/07/2015  . Dysphagia, pharyngoesophageal phase 02/07/2015  . Hypercalcemia 09/14/2013  . Unspecified vitamin D deficiency 04/27/2013  . Breast cancer, left breast 09/16/2012  . Osteoporosis, post-menopausal 03/14/2012  . Dyspnea 03/28/2011  . Cough 03/13/2011  . ATYPICAL MYCOBACTERIAL INFECTION 01/01/2011  . AORTIC ANEURYSM 01/01/2011  . Headache(784.0) 01/01/2011  . OSTEOARTHRITIS, CERVICAL SPINE 08/15/2009  . HYPERGLYCEMIA, FASTING 12/06/2008  . ALLERGIC RHINITIS 03/25/2008  . CHRONIC OBSTRUCTIVE ASTHMA UNSPECIFIED 03/25/2008  . BREAST CANCER, HX OF 03/10/2008  . Mixed hyperlipidemia 02/10/2007  . Essential hypertension 02/10/2007    Erma Pinto 05/13/2015, 4:09 PM  [place] MAIN [facility] SERVICES [address] [place], [place], [address] Phone: [phone]   Fax:  [phone]

## 2015-05-16 ENCOUNTER — Ambulatory Visit: Payer: Medicare Other | Admitting: Speech Pathology

## 2015-05-16 ENCOUNTER — Ambulatory Visit: Payer: Medicare Other | Admitting: Neurology

## 2015-05-16 ENCOUNTER — Ambulatory Visit: Payer: Medicare Other | Admitting: Physical Therapy

## 2015-05-16 ENCOUNTER — Encounter: Payer: Self-pay | Admitting: Physical Therapy

## 2015-05-16 DIAGNOSIS — G20 Parkinson's disease: Secondary | ICD-10-CM

## 2015-05-16 DIAGNOSIS — R490 Dysphonia: Secondary | ICD-10-CM | POA: Diagnosis not present

## 2015-05-16 DIAGNOSIS — R262 Difficulty in walking, not elsewhere classified: Secondary | ICD-10-CM

## 2015-05-16 NOTE — Therapy (Signed)
[place] MAIN [facility] SERVICES [address] [place], [place], [address] Phone: [phone]   Fax:  [phone]  Physical Therapy Treatment  Patient Details  Name: Crystal Bass MRN: [id number] Date of Birth: [birth year] Referring Provider:  Hendricks Limes, MD  Encounter Date: 05/16/2015    Past Medical History  Diagnosis Date  . Pancreatitis     secondary to Cholelithiasis  . Breast cancer 2002    infiltrative ductal carcinoma   . Lesion of breast 1992    right, benign  . Post-thoracotomy pain syndrome   . Uterine fibroid   . Endometriosis   . Ovarian cancer 2004  . Aneurysm of aorta     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  . Osteoporosis, post-menopausal 03/14/2012  . Ductal carcinoma of breast, estrogen receptor positive, stage 1 09/16/2012  . Pseudomonas pneumonia 5/09  . COPD (chronic obstructive pulmonary disease) 9/08  . Mastalgia 7/95  . Lung disease     secondary to [name] infection  . Hypercalcemia 09/14/2013  . Vitamin D deficiency   . Aortic root aneurysm     Past Surgical History  Procedure Laterality Date  . Lung biopsy  2002    [name], Dr [name]  . Cardiac catheterization  2007    essentially negation for significant CAD  . Appendectomy  2004  . Cholecystectomy  2001  . 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 Breast lumpectomy  1992    benign  . Mastectomy modified radical  2002    oral chemotheraphy (tamoxifen then Armidex) no radiation, Dr.[name]  . Tonsillectomy  1946  . Breast biopsy  08/2004    right breast-benign  . Colonoscopy  2014    Dr [name] , due 2019    There were no vitals filed for this visit.  Visit Diagnosis:  Parkinson disease  Difficulty walking      Subjective Assessment - 05/16/15 1533    Subjective Patient is doing her exercises and likes comng to therpay.    Currently in Pain? No/denies           Floor to ceiling x  10 reps, side to side 5 reps, step and reach forward x 10 reps, step and reach backwards x 10, step and reach sideways x 10 , Rock and reach forward/backward x 10 , Rock and reach sideways x 10, functional tasks; 1 sit to stand 2 Pinching activities to mimic clipping her nails with clippers 3. hand coordination tasks 4 washing hair and drying hair.5. Writing tasks Max cueing needed to appropriately perform LSVT tasks with leg, hand, and head position. Fatigue with sit to stand but demonstrating more control CGA to SBA for safety with activities. Uses to increase intensity and amplitude of movements throughout session Patient is able to perform exercises with modeling and verbal correction and cueing to step in the correct position                       PT Education - 05/16/15 1533    Education provided Yes   Education Details LSVT BIG   Person(s) Educated Patient   Methods Explanation;Demonstration   Comprehension Verbalized understanding;Returned demonstration             PT Long Term Goals - 05/13/15 1632                Plan - 05/16/15 1540  Clinical Impression Statement Patient needs cuing for the backwards step and rocking exercise and very minimal cuing for the twist exercise.    Pt will benefit from skilled therapeutic intervention in order to improve on the following deficits Decreased coordination;Difficulty walking;Impaired tone;Decreased activity tolerance;Decreased balance;Impaired flexibility;Decreased mobility;Decreased strength   Rehab Potential Good   PT Frequency 4x / week   PT Duration 4 weeks   PT Treatment/Interventions Therapeutic activities;Therapeutic exercise;Gait training;Balance training   PT Next Visit Plan Continue to work on progressions of LSVT Big exercises; consider trying sit to stand on red foam mat or from lower sitting surface, dual tasks while walking and weighted hair washing exercise.    PT Home Exercise Plan LSVT  BIG        Problem List Patient Active Problem List   Diagnosis Date Noted  . Tremor 02/07/2015  . Dysphagia, pharyngoesophageal phase 02/07/2015  . Hypercalcemia 09/14/2013  . Unspecified vitamin D deficiency 04/27/2013  . Breast cancer, left breast 09/16/2012  . Osteoporosis, post-menopausal 03/14/2012  . Dyspnea 03/28/2011  . Cough 03/13/2011  . ATYPICAL MYCOBACTERIAL INFECTION 01/01/2011  . AORTIC ANEURYSM 01/01/2011  . Headache(784.0) 01/01/2011  . OSTEOARTHRITIS, CERVICAL SPINE 08/15/2009  . HYPERGLYCEMIA, FASTING 12/06/2008  . ALLERGIC RHINITIS 03/25/2008  . CHRONIC OBSTRUCTIVE ASTHMA UNSPECIFIED 03/25/2008  . BREAST CANCER, HX OF 03/10/2008  . Mixed hyperlipidemia 02/10/2007  . Essential hypertension 02/10/2007    Alanson Puls 05/16/2015, 3:43 PM  [place] MAIN [facility] SERVICES [address] [place], [place], [address] Phone: [phone]   Fax:  [phone]

## 2015-05-17 ENCOUNTER — Ambulatory Visit (INDEPENDENT_AMBULATORY_CARE_PROVIDER_SITE_OTHER): Payer: Medicare Other | Admitting: Neurology

## 2015-05-17 ENCOUNTER — Ambulatory Visit: Payer: Medicare Other | Admitting: Speech Pathology

## 2015-05-17 ENCOUNTER — Encounter: Payer: Self-pay | Admitting: Speech Pathology

## 2015-05-17 ENCOUNTER — Ambulatory Visit: Payer: Medicare Other | Admitting: Physical Therapy

## 2015-05-17 ENCOUNTER — Encounter: Payer: Self-pay | Admitting: Neurology

## 2015-05-17 ENCOUNTER — Encounter: Payer: Self-pay | Admitting: Physical Therapy

## 2015-05-17 VITALS — BP 105/69 | HR 77 | Ht 63.0 in | Wt 156.0 lb

## 2015-05-17 VITALS — BP 111/66

## 2015-05-17 DIAGNOSIS — G20 Parkinson's disease: Secondary | ICD-10-CM | POA: Diagnosis not present

## 2015-05-17 DIAGNOSIS — G43009 Migraine without aura, not intractable, without status migrainosus: Secondary | ICD-10-CM | POA: Diagnosis not present

## 2015-05-17 DIAGNOSIS — I6782 Cerebral ischemia: Secondary | ICD-10-CM

## 2015-05-17 DIAGNOSIS — G4752 REM sleep behavior disorder: Secondary | ICD-10-CM

## 2015-05-17 DIAGNOSIS — R490 Dysphonia: Secondary | ICD-10-CM

## 2015-05-17 DIAGNOSIS — R262 Difficulty in walking, not elsewhere classified: Secondary | ICD-10-CM

## 2015-05-17 MED ORDER — DICLOFENAC POTASSIUM(MIGRAINE) 50 MG PO PACK: 50.0000 mg | PACK | ORAL | Status: DC | PRN

## 2015-05-17 MED ORDER — CLONAZEPAM 0.5 MG PO TABS: ORAL_TABLET | ORAL | Status: DC

## 2015-05-17 NOTE — Progress Notes (Signed)
Crystal Bass was seen today in the movement disorders clinic for neurologic consultation at the request of [name], MD.  The consultation is for the evaluation of tremor.  Pt states that it has been going on over a year.  It is mostly in the R hand and rarely in the L hand.  "It is almost constant but not quite."  She notices it when grasping the steering wheel.  Eating was troublesome but better now that d/c the symbicort.  Symbicort worsened tremor by 80%.  Paternal aunt with tremor.  05/17/15 update:  Last visit, I diagnosed the patient with Parkinson's disease and started her on carbidopa/levodopa 25/100, one tablet 3 times per day.  She states that for the first 6 weeks she had diarrhea but it got better and it went away.  She does think that the tremor is better but does know that stress will trigger the tremor.  She also states that speech therapy seems to set off tremor.  She was started in physical therapy at [facility].  She has noted that she is waking up from dreams yelling and acting out dreams.  She does have a long history of migraine headaches and last visit had wanted me to prescribe Imitrex.  I was very nervous that her age and did not feel that she was likely a triptan candidate.  I ended up ordering an MRI of the brain on 02/28/2015.  I reviewed this and there was evidence of moderate small vessel disease.  I subsequently told her that she was not a candidate for the triptans.  She also had an MRI of the cervical spine on a performed, 2016.  There was mild multilevel cervical spondylosis.  Unfortunately, she has had several headaches since last visit that were fairly severe.  She did see Dr. [name] since last visit in regards to dysphagia and she states that it was scheduled but she ended up having to reschedule it.  Neuroimaging has  previously been performed.  It is not available for my review today.  It was done 6-10 years ago.  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  They  are better than in the past but still has them several times a month.  She sees Dr. Sima Matas in [place] in [place].    PREVIOUS MEDICATIONS: none to date  ALLERGIES:   Allergies  Allergen Reactions  . Sulfonamide Derivatives Anaphylaxis  . Topamax [Topiramate] Other (See Comments)    Metabolic acidosis   . Biaxin [Clarithromycin] Other (See Comments)    pericarditis  . Hydrocodone Other (See Comments)    "hyper and climbing the walls"  . Motrin [Ibuprofen] Other (See Comments)    headaches  . Symbicort [Budesonide-Formoterol Fumarate]     02/07/15 tremor  . Percocet [Oxycodone-Acetaminophen] Itching and Rash  . Tape Itching and Rash    Use paper tape only  . Tetracyclines & Related Other (See Comments)    "immediate yeast infection"    CURRENT MEDICATIONS:  Outpatient Encounter Prescriptions as of 05/17/2015  Medication Sig  . acetaminophen (TYLENOL) 325 MG tablet Take 1,000 mg by mouth 2 (two) times daily as needed.   [name] albuterol (PROAIR HFA) 108 (90 BASE) MCG/ACT inhaler Inhale 2 puffs into the lungs every 6 (six) hours as needed.  [name] aspirin 81 MG tablet Take 81 mg by mouth daily.    . Calcium Carbonate-Vit D-Min (CALCIUM 600 + MINERALS) 600-200 MG-UNIT TABS Take 1 tablet by mouth 2 (two) times  daily.  . carbidopa-levodopa ([name]) 25-100 MG per tablet Take 1 tablet by mouth 3 (three) times daily.  . diazepam (VALIUM) 5 MG tablet Take 1 tablet (5 mg total) by mouth once. Take after arriving at facility MUST have a driver  . gabapentin ([name]) 300 MG capsule TAKE 1 TO 3 CAPSULES BY MOUTH EVERY 8 HOURS AS NEEDED  . losartan (COZAAR) 50 MG tablet TAKE 1 TABLET EVERY DAY  . metoprolol tartrate (LOPRESSOR) 25 MG tablet 1 by mouth twice a day  . Multiple Vitamin (MULTIVITAMIN) capsule Take 1 capsule by mouth daily.  . Vitamin D, Ergocalciferol, ([name]) 50000 UNITS CAPS capsule TAKE 1 CAPLET BY MOUTH EVERY WEEK  . zolendronic acid (ZOMETA) 4 MG/5ML injection Inject 5 mLs (4  mg total) into the vein every 6 (six) months.   No facility-administered encounter medications on file as of 05/17/2015.    PAST MEDICAL HISTORY:   Past Medical History  Diagnosis Date  . Pancreatitis     secondary to Cholelithiasis  . Breast cancer 2002    infiltrative ductal carcinoma   . Lesion of breast 1992    right, benign  . Post-thoracotomy pain syndrome   . Uterine fibroid   . Endometriosis   . Ovarian cancer 2004  . Aneurysm of aorta     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  . Osteoporosis, post-menopausal 03/14/2012  . Ductal carcinoma of breast, estrogen receptor positive, stage 1 09/16/2012  . Pseudomonas pneumonia 5/09  . COPD (chronic obstructive pulmonary disease) 9/08  . Mastalgia 7/95  . Lung disease     secondary to [name] infection  . Hypercalcemia 09/14/2013  . Vitamin D deficiency   . Aortic root aneurysm     PAST SURGICAL HISTORY:   Past Surgical History  Procedure Laterality Date  . Lung biopsy  2002    [name], Dr [name]  . Cardiac catheterization  2007    essentially negation for significant CAD  . Appendectomy  2004  . Cholecystectomy  2001  . 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 Breast lumpectomy  1992    benign  . Mastectomy modified radical  2002    oral chemotheraphy (tamoxifen then Armidex) no radiation, Dr.[name]  . Tonsillectomy  1946  . Breast biopsy  08/2004    right breast-benign  . Colonoscopy  2014    Dr [name] , due 2019    SOCIAL HISTORY:   History   Social History  . Marital Status: Widowed    Spouse Name: N/A  . Number of Children: 0  . Years of Education: N/A   Occupational History  . 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 Smoking status: Never Smoker   . Smokeless tobacco: Never Used  . Alcohol Use: No  . Drug Use: No  . Sexual Activity: No     Comment: TAH/BSO   Other Topics Concern  . Not on  file   Social History Narrative   Caregiver for >58 yo mother in law living with pt    FAMILY HISTORY:   Family Status  Relation Status Death Age  . Father Deceased     lung cancer  . Sister Alive     colon/breast cancer  . Mother Deceased     emphysema  . Brother Alive     healthy    ROS:  A  complete 10 system review of systems was obtained and was unremarkable apart from what is mentioned above.  PHYSICAL EXAMINATION:    VITALS:   Filed Vitals:   05/17/15 1035  BP: 105/69  Pulse: 77  Height: 5\' 3"  (1.6 m)  Weight: 156 lb (70.761 kg)  SpO2: 92%    GEN:  The patient appears stated age and is in NAD. HEENT:  Normocephalic, atraumatic.  The mucous membranes are moist. The superficial temporal arteries are without ropiness or tenderness. CV:  RRR Lungs:  CTAB Neck/HEME:  There are no carotid bruits bilaterally.  Neurological examination:  Orientation: The patient is alert and oriented x3. Fund of knowledge is appropriate.  Recent and remote memory are intact.  Attention and concentration are normal.    Able to name objects and repeat phrases. Cranial nerves: There is good facial symmetry with the exception of the fact that when asked to raise the eyebrows, the right eyebrow raises further than the left.  There is mild facial hypomimia. The speech is fluent and clear. Soft palate rises symmetrically and there is no tongue deviation. Hearing is intact to conversational tone. Sensation: Sensation is intact to light touch throughout. Motor: Strength is 5/5 in the bilateral upper and lower extremities.   Shoulder shrug is equal and symmetric.  There is no pronator drift.   Movement examination: Tone: There is normal tone bilaterally today Abnormal movements: There is a mild right upper extremity resting tremor. Coordination:  There is mild decremation seen with finger taps and alternation of supination/pronation. Gait and Station: The patient has no difficulty arising out  of a deep-seated chair without the use of the hands. The patient's stride length is normal, but she has improved arm swing.  ASSESSMENT/PLAN:  1.   idiopathic Parkinson's disease.  The patient has tremor, bradykinesia, rigidity and mild postural instability.  Sx's have been present for at least since March, 2015 by the patients account today.  She is Hoehn and Yahr stage 2-2.5.    -She will continue on the R/levodopa 25/100, one tablet 3 times per day.  We discussed again how to take this in relationship to mealtime and protein.  -She is finishing up physical therapy.  We talked about the importance of continuing therapy/exercise once formal therapy is done. 2.  Mild dysphagia  -Pt states rarely a problem.  Will need to monitor and may need MBE in future  -She is seeing Dr. [name] and they are considering EGD with dilation 3.  Long hx migraine  -No longer a candidate for triptan therapy.  This is due to cerebral small vessel disease.  Showed her images of her MRI today.  We discussed alternatives.  Ultimately decided on Cambia.  Discussed with her the risks of Cambia in detail.  She would like to try it. 4.  Neck pain/hyperreflexia  -MRI cervical spine in April, 2016 just demonstrated mild multilevel cervical spondylosis. 5.  REM behavior disorder  -Becoming more problematic.  Would like to start clonazepam, 0.5 mg, half tablet at night.  Discussed risks, benefits, side effects.  Understanding is expressed. 6.  Follow-up in the next few months, sooner should new neurologic issues arise.  Greater than 50% to 25 minute visit in counseling.

## 2015-05-17 NOTE — Therapy (Signed)
[place] MAIN [facility] SERVICES [address] [place], [place], [address] Phone: [phone]   Fax:  [phone]  Physical Therapy Treatment  Patient Details  Name: Crystal Bass MRN: [id number] Date of Birth: 1941/03/27 Referring Provider:  Ludwig Clarks, DO  Encounter Date: 05/17/2015      PT End of Session - 05/17/15 1304    Visit Number 11   Number of Visits 17   Date for PT Re-Evaluation 05/25/15   PT Start Time 5027   PT Stop Time 1358   PT Time Calculation (min) 55 min   Activity Tolerance Patient limited by fatigue  and limited by cardiopulmonary endurance      Past Medical History  Diagnosis Date  . Pancreatitis     secondary to Cholelithiasis  . Breast cancer 2002    infiltrative ductal carcinoma   . Lesion of breast 1992    right, benign  . Post-thoracotomy pain syndrome   . Uterine fibroid   . Endometriosis   . Ovarian cancer 2004  . Aneurysm of aorta     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  . Osteoporosis, post-menopausal 03/14/2012  . Ductal carcinoma of breast, estrogen receptor positive, stage 1 09/16/2012  . Pseudomonas pneumonia 5/09  . COPD (chronic obstructive pulmonary disease) 9/08  . Mastalgia 7/95  . Lung disease     secondary to [name] infection  . Hypercalcemia 09/14/2013  . Vitamin D deficiency   . Aortic root aneurysm     Past Surgical History  Procedure Laterality Date  . Lung biopsy  2002    [name], Dr [name]  . Cardiac catheterization  2007    essentially negation for significant CAD  . Appendectomy  2004  . Cholecystectomy  2001  . 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 Breast lumpectomy  1992    benign  . Mastectomy modified radical  2002    oral chemotheraphy (tamoxifen then Armidex) no radiation, Dr.[name]  . Tonsillectomy  1946  . Breast biopsy  08/2004    right breast-benign  . Colonoscopy  2014    Dr [name] , due 2019     Filed Vitals:   05/17/15 1359  BP: 111/66    Visit Diagnosis:  Parkinson disease  Difficulty walking      Subjective Assessment - 05/17/15 1435    Subjective Patient states she went to see her neurologist this am and states she is fatigued this afternoon. Pt states she feels like she has improved a lot with being better able to do her hair, but states she has not tried trimming her nails at home yet.    Currently in Pain? No/denies      LSVT: Patient seen for LSVT Daily Session Maximal Daily Exercises for facilitation/coordination of movement Maximum Sustained Movements are designed to rescale the amplitude of movement output for generalization to daily functional activities. Patient seen for LSVT Daily Session Maximal Daily Exercises for facilitation/coordination of movement Sustained movements are designed to rescale the amplitude of movement output for generalization to daily functional activities Performed as follows for 1 set of 10 repetitions each multidirectional sustained movements 1) Floor to ceiling 2) Side to side multidirectional Repetitive movements performed in standing and are designed to provide retraining effort needed for sustained muscle activation in tasks  Patient seen for LSVT Daily Session Maximal Daily Exercises for facilitation/coordination of movement Maximum Sustained Movements are  designed to rescale the amplitude of movement output for generalization to daily functional activities Performed as follows 3) Step and reach forward 4) Step and reach backwards 5) Step and reach sideways 6) Rock and reach forward/backward 7) Rock and reach sideways Sit to stand functional component task with supervision 5 reps, simulated hair washing activities with one pound hand weights, peg board with pinching tool set at second easiest setting 15 pegs L and R hand, selecting 6 paper clips one at a time form jar and pinching open to secure on side of a magazine 2 reps of 6 L and R  hand.  Functional mobility: ambulated >650' with fair cadence and reciprocating arm swing holding 1 pound hand weights while doing dual naming tasks.          PT Education - 05/16/15 1533    Education provided Yes   Education Details LSVT BIG   Person(s) Educated Patient   Methods Explanation;Demonstration   Comprehension Verbalized understanding;Returned demonstration            PT Long Term Goals - 05/13/15 1632                Plan - 05/17/15 1438    Clinical Impression Statement Patient required one cue to step and turn her foot out with the [facility] and reach exercise this date otherwise she was able to self-correct. Pt did well with dual tasks while walking with one pound hand weights this date and with weighted hair washing exercise. Pt would benefit from completing the LSVT BIG program in order to help maximize patient's functional status.    Pt will benefit from skilled therapeutic intervention in order to improve on the following deficits Decreased coordination;Difficulty walking;Impaired tone;Decreased activity tolerance;Decreased balance;Impaired flexibility;Decreased mobility;Decreased strength   Rehab Potential Good   PT Frequency 4x / week   PT Duration 4 weeks   PT Treatment/Interventions Therapeutic activities;Therapeutic exercise;Gait training;Balance training   PT Next Visit Plan Continue to work on progressions of LSVT Big exercises; consider trying sit to stand on red foam mat or from lower sitting surface.   PT Home Exercise Plan LSVT BIG   Consulted and Agree with Plan of Care Patient        Problem List Patient Active Problem List   Diagnosis Date Noted  . Tremor 02/07/2015  . Dysphagia, pharyngoesophageal phase 02/07/2015  . Hypercalcemia 09/14/2013  . Unspecified vitamin D deficiency 04/27/2013  . Breast cancer, left breast 09/16/2012  . Osteoporosis, post-menopausal 03/14/2012  . Dyspnea 03/28/2011  . Cough 03/13/2011  . ATYPICAL  MYCOBACTERIAL INFECTION 01/01/2011  . AORTIC ANEURYSM 01/01/2011  . Headache(784.0) 01/01/2011  . OSTEOARTHRITIS, CERVICAL SPINE 08/15/2009  . HYPERGLYCEMIA, FASTING 12/06/2008  . ALLERGIC RHINITIS 03/25/2008  . CHRONIC OBSTRUCTIVE ASTHMA UNSPECIFIED 03/25/2008  . BREAST CANCER, HX OF 03/10/2008  . Mixed hyperlipidemia 02/10/2007  . Essential hypertension 02/10/2007   Lady Deutscher PT, DPT Lady Deutscher 05/17/2015, 2:50 PM  [place] MAIN [name] SERVICES [address] [place], [place], [address] Phone: [phone]   Fax:  [phone]

## 2015-05-17 NOTE — Therapy (Signed)
[place] MAIN [facility] SERVICES [place], [place], [address] Phone: [phone]   Fax:  [phone]  Speech Language Pathology Treatment  Patient Details  Name: Crystal Bass MRN: [id number] Date of Birth: 1940/11/27 Referring Provider:  Hendricks Limes, MD  Encounter Date: 05/16/2015      End of Session - 05/17/15 0816    Visit Number 13   Number of Visits 17   Date for SLP Re-Evaluation 05/25/15   SLP Start Time 0200   SLP Stop Time  0256   SLP Time Calculation (min) 56 min   Activity Tolerance Patient tolerated treatment well      Past Medical History  Diagnosis Date  . Pancreatitis     secondary to Cholelithiasis  . Breast cancer 2002    infiltrative ductal carcinoma   . Lesion of breast 1992    right, benign  . Post-thoracotomy pain syndrome   . Uterine fibroid   . Endometriosis   . Ovarian cancer 2004  . Aneurysm of aorta     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  . Osteoporosis, post-menopausal 03/14/2012  . Ductal carcinoma of breast, estrogen receptor positive, stage 1 09/16/2012  . Pseudomonas pneumonia 5/09  . COPD (chronic obstructive pulmonary disease) 9/08  . Mastalgia 7/95  . Lung disease     secondary to [name] infection  . Hypercalcemia 09/14/2013  . Vitamin D deficiency   . Aortic root aneurysm     Past Surgical History  Procedure Laterality Date  . Lung biopsy  2002    [name], Dr [name]  . Cardiac catheterization  2007    essentially negation for significant CAD  . Appendectomy  2004  . Cholecystectomy  2001  . 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 Breast lumpectomy  1992    benign  . Mastectomy modified radical  2002    oral chemotheraphy (tamoxifen then Armidex) no radiation, Dr.[name]  . Tonsillectomy  1946  . Breast biopsy  08/2004    right breast-benign  . Colonoscopy  2014    Dr [name] , due 2019    There were no vitals  filed for this visit.  Visit Diagnosis: Dysphonia  Parkinson disease      Subjective Assessment - 05/17/15 0815    Subjective Patient says the amount of effort she puts into loudness feels "better than it was."    Currently in Pain? No/denies               ADULT SLP TREATMENT - 05/17/15 0001    Treatment Provided   Treatment provided Cognitive-Linquistic   Pain Assessment   Pain Assessment No/denies pain   Cognitive-Linquistic Treatment   Treatment focused on Other (comment)  LSVT LOUD   Skilled Treatment Daily Task #1: Average 9 seconds, 87 dB. Daily Task 2: Highs: 15 high pitched "ah" given min cues. Lows: 15 low pitched "ah" given minimal cues for pitch and loudness. Daily task #3: Average 76 dB.  Hierarchal speech loudness drill: Read paragraphs, 74 dB. Conversational speech, 72 dB.  Homework: assignments completed.  Off the cuff remarks: average 73 dB; up to 76 dB self-cued.     Assessment / Recommendations / Plan   Plan Continue with current plan of care   Progression Toward Goals   Progression toward goals Progressing toward goals          SLP Education -  05/17/15 0816    Education provided Yes   Education Details LSVT LOUD   Person(s) Educated Patient   Methods Explanation;Demonstration   Comprehension Verbalized understanding;Returned demonstration            SLP Long Term Goals - 04/19/15 1634    SLP LONG TERM GOAL #1   Title The patient will complete Daily Tasks (Maximum duration "ah", High/Lows, and Functional Phrases) at average loudness of 80 dB and with loud, good quality voice.    Time 5   Period Weeks   Status New   SLP LONG TERM GOAL #2   Title The patient will complete Hierarchal Speech Loudness reading drills (words/phrases, sentences, and paragraph) at average 75 dB and with loud, good quality voice.     Time 5   Period Weeks   Status New   SLP LONG TERM GOAL #3   Title The patient will complete homework daily.   Time 5   Period  Weeks   Status New   SLP LONG TERM GOAL #4   Title The patient will participate in conversation, maintaining average loudness of 75 dB and loud, good quality voice.   Time 5   Period Weeks   Status New          Plan - 05/17/15 3875    Clinical Impression Statement The patient is completing daily tasks and hierarchal speech drill tasks with loud, good quality voice given min SLP cues.  She is demonstrating increased self monitoring and generalization into conversation.     Speech Therapy Frequency 4x / week   Duration Other (comment)   Treatment/Interventions SLP instruction and feedback   Potential to Achieve Goals Good   Potential Considerations Ability to learn/carryover information;Cooperation/participation level;Previous level of function;Severity of impairments;Family/community support   SLP Home Exercise Plan LSVT-LOUD homework   Consulted and Agree with Plan of Care Patient        Problem List Patient Active Problem List   Diagnosis Date Noted  . Tremor 02/07/2015  . Dysphagia, pharyngoesophageal phase 02/07/2015  . Hypercalcemia 09/14/2013  . Unspecified vitamin D deficiency 04/27/2013  . Breast cancer, left breast 09/16/2012  . Osteoporosis, post-menopausal 03/14/2012  . Dyspnea 03/28/2011  . Cough 03/13/2011  . ATYPICAL MYCOBACTERIAL INFECTION 01/01/2011  . AORTIC ANEURYSM 01/01/2011  . Headache(784.0) 01/01/2011  . OSTEOARTHRITIS, CERVICAL SPINE 08/15/2009  . HYPERGLYCEMIA, FASTING 12/06/2008  . ALLERGIC RHINITIS 03/25/2008  . CHRONIC OBSTRUCTIVE ASTHMA UNSPECIFIED 03/25/2008  . BREAST CANCER, HX OF 03/10/2008  . Mixed hyperlipidemia 02/10/2007  . Essential hypertension 02/10/2007    Erma Pinto 05/17/2015, 8:17 AM  [place] MAIN [facility] SERVICES [address] [place], [place], [address] Phone: [phone]   Fax:  [phone]

## 2015-05-17 NOTE — Patient Instructions (Signed)
1.  Take one dose of Cambia to treat your migraine headache:  remove one single dose packet from a set of three packets open the Cambia packet only when you are ready to use it empty contents of packet into 1 to 2 ounces or 2 to 4 tablespoons (30 to 60 mL) of water. Do not use any other form of liquid mix well and drink the water and powder mixture throw away empty packet in a safe place and out of the reach of children taking Cambia with food may cause a reduction in effectiveness compared to taking Cambia on an empty stomach. Do not use more than 2 cambia packets a week Let someone know if you have black/bloody stools with cambia  2.  Klonopin - 0.5 mg - 1/2 tablet at night for the acting out of dreams

## 2015-05-17 NOTE — Therapy (Signed)
[place] MAIN [facility] SERVICES [address] [place], [place], [address] Phone: [phone]   Fax:  [phone]  Speech Language Pathology Treatment  Patient Details  Name: Crystal Bass MRN: [id number] Date of Birth: 1941/09/12 Referring Provider:  Hendricks Limes, MD  Encounter Date: 05/17/2015      End of Session - 05/17/15 1516    Visit Number 14   Number of Visits 17   Date for SLP Re-Evaluation 05/25/15   SLP Start Time 0200   SLP Stop Time  0256   SLP Time Calculation (min) 56 min   Activity Tolerance Patient tolerated treatment well      Past Medical History  Diagnosis Date  . Pancreatitis     secondary to Cholelithiasis  . Breast cancer 2002    infiltrative ductal carcinoma   . Lesion of breast 1992    right, benign  . Post-thoracotomy pain syndrome   . Uterine fibroid   . Endometriosis   . Ovarian cancer 2004  . Aneurysm of aorta     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  . Osteoporosis, post-menopausal 03/14/2012  . Ductal carcinoma of breast, estrogen receptor positive, stage 1 09/16/2012  . Pseudomonas pneumonia 5/09  . COPD (chronic obstructive pulmonary disease) 9/08  . Mastalgia 7/95  . Lung disease     secondary to [name] infection  . Hypercalcemia 09/14/2013  . Vitamin D deficiency   . Aortic root aneurysm     Past Surgical History  Procedure Laterality Date  . Lung biopsy  2002    [name], Dr [name]  . Cardiac catheterization  2007    essentially negation for significant CAD  . Appendectomy  2004  . Cholecystectomy  2001  . 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 Breast lumpectomy  1992    benign  . Mastectomy modified radical  2002    oral chemotheraphy (tamoxifen then Armidex) no radiation, Dr.[name]  . Tonsillectomy  1946  . Breast biopsy  08/2004    right breast-benign  . Colonoscopy  2014    Dr [name] , due 2019    There were no vitals  filed for this visit.  Visit Diagnosis: Parkinson disease  Dysphonia      Subjective Assessment - 05/17/15 1515    Subjective Crystal Bass said she'd had a busy day of appointments and hadn't had time to have lunch. She was offered a snack but declined.   Currently in Pain? No/denies               ADULT SLP TREATMENT - 05/17/15 1511    Treatment Provided   Treatment provided Cognitive-Linquistic   Cognitive-Linquistic Treatment   Treatment focused on Other (comment)  LSVT LOUD   Skilled Treatment Daily Task #1: Average 9 seconds, 85 dB. Daily Task 2: Highs: 15 high pitched "ah" given min cues. Lows: 15 low pitched "ah" given minimal cues for loudness. Daily task #3: Average 76 dB.  Hierarchal speech loudness drill: Read paragraphs, 73 dB. Conversational speech, 71 dB.  Homework: assignments completed.  Off the cuff remarks: average 73 dB; up to 77 dB self-cued.     Assessment / Recommendations / Plan   Plan Continue with current plan of care   Progression Toward Goals   Progression toward goals Progressing toward goals          SLP Education - 05/17/15 1516  Education provided Yes   Education Details SVT LOUD   Person(s) Educated Patient   Methods Explanation;Demonstration   Comprehension Verbalized understanding;Returned demonstration            SLP Long Term Goals - 04/19/15 1634    SLP LONG TERM GOAL #1   Title The patient will complete Daily Tasks (Maximum duration "ah", High/Lows, and Functional Phrases) at average loudness of 80 dB and with loud, good quality voice.    Time 5   Period Weeks   Status New   SLP LONG TERM GOAL #2   Title The patient will complete Hierarchal Speech Loudness reading drills (words/phrases, sentences, and paragraph) at average 75 dB and with loud, good quality voice.     Time 5   Period Weeks   Status New   SLP LONG TERM GOAL #3   Title The patient will complete homework daily.   Time 5   Period Weeks   Status New    SLP LONG TERM GOAL #4   Title The patient will participate in conversation, maintaining average loudness of 75 dB and loud, good quality voice.   Time 5   Period Weeks   Status New          Plan - 05/17/15 1517    Clinical Impression Statement The patient is completing daily tasks and hierarchal speech drill tasks with loud, good quality voice given min SLP cues.  She is demonstrating increased self monitoring and generalization into conversation.     Speech Therapy Frequency 4x / week   Duration Other (comment)   Treatment/Interventions SLP instruction and feedback   Potential to Achieve Goals Good   Potential Considerations Ability to learn/carryover information;Cooperation/participation level;Previous level of function;Severity of impairments;Family/community support   SLP Home Exercise Plan LSVT-LOUD homework   Consulted and Agree with Plan of Care Patient        Problem List Patient Active Problem List   Diagnosis Date Noted  . Tremor 02/07/2015  . Dysphagia, pharyngoesophageal phase 02/07/2015  . Hypercalcemia 09/14/2013  . Unspecified vitamin D deficiency 04/27/2013  . Breast cancer, left breast 09/16/2012  . Osteoporosis, post-menopausal 03/14/2012  . Dyspnea 03/28/2011  . Cough 03/13/2011  . ATYPICAL MYCOBACTERIAL INFECTION 01/01/2011  . AORTIC ANEURYSM 01/01/2011  . Headache(784.0) 01/01/2011  . OSTEOARTHRITIS, CERVICAL SPINE 08/15/2009  . HYPERGLYCEMIA, FASTING 12/06/2008  . ALLERGIC RHINITIS 03/25/2008  . CHRONIC OBSTRUCTIVE ASTHMA UNSPECIFIED 03/25/2008  . BREAST CANCER, HX OF 03/10/2008  . Mixed hyperlipidemia 02/10/2007  . Essential hypertension 02/10/2007    Erma Pinto 05/17/2015, 3:18 PM  [place] MAIN [facility] SERVICES [address] [place], [place], [address] Phone: [phone]   Fax:  [phone]

## 2015-05-18 ENCOUNTER — Ambulatory Visit: Payer: Medicare Other | Admitting: Speech Pathology

## 2015-05-18 ENCOUNTER — Encounter: Payer: Self-pay | Admitting: Speech Pathology

## 2015-05-18 DIAGNOSIS — G20A1 Parkinson's disease without dyskinesia, without mention of fluctuations: Secondary | ICD-10-CM

## 2015-05-18 DIAGNOSIS — G20 Parkinson's disease: Secondary | ICD-10-CM

## 2015-05-18 DIAGNOSIS — R490 Dysphonia: Secondary | ICD-10-CM

## 2015-05-18 NOTE — Therapy (Signed)
[place] MAIN [facility] SERVICES [place], [place], [address] Phone: [phone]   Fax:  [phone]  Speech Language Pathology Treatment  Patient Details  Name: Crystal Bass MRN: [id number] Date of Birth: [birth year] Referring Provider:  Hendricks Limes, MD  Encounter Date: 05/18/2015      End of Session - 05/18/15 1539    Visit Number 15   Number of Visits 17   Date for SLP Re-Evaluation 05/25/15   SLP Start Time 1400   SLP Stop Time  1455   SLP Time Calculation (min) 55 min   Activity Tolerance Patient tolerated treatment well      Past Medical History  Diagnosis Date  . Pancreatitis     secondary to Cholelithiasis  . Breast cancer 2002    infiltrative ductal carcinoma   . Lesion of breast 1992    right, benign  . Post-thoracotomy pain syndrome   . Uterine fibroid   . Endometriosis   . Ovarian cancer 2004  . Aneurysm of aorta     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  . Osteoporosis, post-menopausal 03/14/2012  . Ductal carcinoma of breast, estrogen receptor positive, stage 1 09/16/2012  . Pseudomonas pneumonia 5/09  . COPD (chronic obstructive pulmonary disease) 9/08  . Mastalgia 7/95  . Lung disease     secondary to [name] infection  . Hypercalcemia 09/14/2013  . Vitamin D deficiency   . Aortic root aneurysm     Past Surgical History  Procedure Laterality Date  . Lung biopsy  2002    [name], Dr [name]  . Cardiac catheterization  2007    essentially negation for significant CAD  . Appendectomy  2004  . Cholecystectomy  2001  . 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 Breast lumpectomy  1992    benign  . Mastectomy modified radical  2002    oral chemotheraphy (tamoxifen then Armidex) no radiation, Dr.[name]  . Tonsillectomy  1946  . Breast biopsy  08/2004    right breast-benign  . Colonoscopy  2014    Dr [name] , due 2019    There were no vitals  filed for this visit.  Visit Diagnosis: Parkinson disease  Dysphonia      Subjective Assessment - 05/18/15 1538    Subjective Crystal Bass said she is feeling a little short of breath today.    Currently in Pain? No/denies               ADULT SLP TREATMENT - 05/18/15 1536    Treatment Provided   Treatment provided Cognitive-Linquistic   Pain Assessment   Pain Assessment No/denies pain   Cognitive-Linquistic Treatment   Treatment focused on Other (comment)  LSVT LOUD   Skilled Treatment Daily Task #1: Average 8 seconds, 84 dB. Daily Task 2: Highs: 15 high pitched "ah" given min cues. Lows: 15 low pitched "ah" given moderate cues for loudness. Daily task #3: Average 75 dB.  Hierarchal speech loudness drill: Read paragraphs, 74 dB. Conversational speech, 72 dB.  Homework: assignments completed.  Off the cuff remarks: average 74 dB; up to 77 dB self-cued.    Assessment / Recommendations / Plan   Plan Continue with current plan of care   Progression Toward Goals   Progression toward goals Progressing toward goals          SLP Education - 05/18/15 1538    Education  provided Yes   Education Details LSVT LOUD   Person(s) Educated Patient   Methods Explanation;Demonstration;Verbal cues   Comprehension Verbalized understanding;Returned demonstration            SLP Long Term Goals - 04/19/15 1634    SLP LONG TERM GOAL #1   Title The patient will complete Daily Tasks (Maximum duration "ah", High/Lows, and Functional Phrases) at average loudness of 80 dB and with loud, good quality voice.    Time 5   Period Weeks   Status New   SLP LONG TERM GOAL #2   Title The patient will complete Hierarchal Speech Loudness reading drills (words/phrases, sentences, and paragraph) at average 75 dB and with loud, good quality voice.     Time 5   Period Weeks   Status New   SLP LONG TERM GOAL #3   Title The patient will complete homework daily.   Time 5   Period Weeks   Status  New   SLP LONG TERM GOAL #4   Title The patient will participate in conversation, maintaining average loudness of 75 dB and loud, good quality voice.   Time 5   Period Weeks   Status New          Plan - 05/18/15 1539    Clinical Impression Statement The patient is completing daily tasks and hierarchal speech drill tasks with loud, good quality voice given min SLP cues.  She is demonstrating increased self monitoring and generalization into conversation.     Speech Therapy Frequency 4x / week   Duration Other (comment)   Treatment/Interventions SLP instruction and feedback   Potential to Achieve Goals Good   Potential Considerations Ability to learn/carryover information;Cooperation/participation level;Previous level of function;Severity of impairments;Family/community support   SLP Home Exercise Plan LSVT-LOUD homework   Consulted and Agree with Plan of Care Patient        Problem List Patient Active Problem List   Diagnosis Date Noted  . Tremor 02/07/2015  . Dysphagia, pharyngoesophageal phase 02/07/2015  . Hypercalcemia 09/14/2013  . Unspecified vitamin D deficiency 04/27/2013  . Breast cancer, left breast 09/16/2012  . Osteoporosis, post-menopausal 03/14/2012  . Dyspnea 03/28/2011  . Cough 03/13/2011  . ATYPICAL MYCOBACTERIAL INFECTION 01/01/2011  . AORTIC ANEURYSM 01/01/2011  . Headache(784.0) 01/01/2011  . OSTEOARTHRITIS, CERVICAL SPINE 08/15/2009  . HYPERGLYCEMIA, FASTING 12/06/2008  . ALLERGIC RHINITIS 03/25/2008  . CHRONIC OBSTRUCTIVE ASTHMA UNSPECIFIED 03/25/2008  . BREAST CANCER, HX OF 03/10/2008  . Mixed hyperlipidemia 02/10/2007  . Essential hypertension 02/10/2007    Crystal Bass 05/18/2015, 3:39 PM  [place] MAIN [facility] SERVICES [address] [place], [place], [address] Phone: [phone]   Fax:  [phone]

## 2015-05-19 ENCOUNTER — Encounter: Payer: Self-pay | Admitting: Speech Pathology

## 2015-05-19 ENCOUNTER — Ambulatory Visit: Payer: Medicare Other | Admitting: Speech Pathology

## 2015-05-19 ENCOUNTER — Ambulatory Visit: Payer: Medicare Other | Admitting: Physical Therapy

## 2015-05-19 DIAGNOSIS — R490 Dysphonia: Secondary | ICD-10-CM

## 2015-05-19 DIAGNOSIS — G20A1 Parkinson's disease without dyskinesia, without mention of fluctuations: Secondary | ICD-10-CM

## 2015-05-19 DIAGNOSIS — G20 Parkinson's disease: Secondary | ICD-10-CM

## 2015-05-19 NOTE — Therapy (Signed)
[place] MAIN [facility] SERVICES [place], [place], [address] Phone: [phone]   Fax:  [phone]  Speech Language Pathology Discharge Summary  Patient Details  Name: Crystal Bass MRN: [id number] Date of Birth: [birth year] Referring Provider:  Hendricks Limes, MD  Encounter Date: 05/19/2015      End of Session - 05/19/15 1540    Visit Number 16   Number of Visits 17   Date for SLP Re-Evaluation 05/25/15   SLP Start Time 1355   SLP Stop Time  1455   SLP Time Calculation (min) 60 min   Activity Tolerance Patient tolerated treatment well      Past Medical History  Diagnosis Date  . Pancreatitis     secondary to Cholelithiasis  . Breast cancer 2002    infiltrative ductal carcinoma   . Lesion of breast 1992    right, benign  . Post-thoracotomy pain syndrome   . Uterine fibroid   . Endometriosis   . Ovarian cancer 2004  . Aneurysm of aorta     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  . Osteoporosis, post-menopausal 03/14/2012  . Ductal carcinoma of breast, estrogen receptor positive, stage 1 09/16/2012  . Pseudomonas pneumonia 5/09  . COPD (chronic obstructive pulmonary disease) 9/08  . Mastalgia 7/95  . Lung disease     secondary to [name] infection  . Hypercalcemia 09/14/2013  . Vitamin D deficiency   . Aortic root aneurysm     Past Surgical History  Procedure Laterality Date  . Lung biopsy  2002    [name], Dr [name]  . Cardiac catheterization  2007    essentially negation for significant CAD  . Appendectomy  2004  . Cholecystectomy  2001  . 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 Breast lumpectomy  1992    benign  . Mastectomy modified radical  2002    oral chemotheraphy (tamoxifen then Armidex) no radiation, Dr.[name]  . Tonsillectomy  1946  . Breast biopsy  08/2004    right breast-benign  . Colonoscopy  2014    Dr [name] , due 2019    There were no  vitals filed for this visit.  Visit Diagnosis: Parkinson disease  Dysphonia      Subjective Assessment - 05/19/15 1533    Subjective Crystal Bass is using a loud, clear voice and carrying it with her outside of the therapy room.   Currently in Pain? No/denies               ADULT SLP TREATMENT - 05/19/15 1527    Treatment Provided   Treatment provided Cognitive-Linquistic   Pain Assessment   Pain Assessment No/denies pain   Cognitive-Linquistic Treatment   Treatment focused on Other (comment)  LSVT LOUD   Skilled Treatment Daily Task #1: Average 9 seconds, 86 dB. Daily Task 2: Highs: 15 high pitched "ah" given min cues. Lows: 15 low pitched "ah" given minimal cues. Daily task #3: Average 75 dB.  Hierarchal speech loudness drill: Read paragraphs, 74 dB. Conversational speech, 73 dB.  Homework: assignments completed.  Off the cuff remarks: average 75 dB; up to 77 dB self-cued.     Assessment / Recommendations / Plan   Plan Discharge SLP treatment due to (comment);All goals met   Progression Toward Goals   Progression toward goals Progressing toward goals          SLP Education -  06-Jun-2015 1540    Education provided Yes   Education Details LSVT LOUD   Person(s) Educated Patient   Methods Explanation;Demonstration   Comprehension Verbalized understanding;Returned demonstration            SLP Long Term Goals - 06-Jun-2015 1544    SLP LONG TERM GOAL #1   Title The patient will complete Daily Tasks (Maximum duration "ah", High/Lows, and Functional Phrases) at average loudness of 80 dB and with loud, good quality voice.    Status Achieved   SLP LONG TERM GOAL #2   Title The patient will complete Hierarchal Speech Loudness reading drills (words/phrases, sentences, and paragraph) at average 75 dB and with loud, good quality voice.     Status Achieved   SLP LONG TERM GOAL #3   Title The patient will complete homework daily.   Status Achieved   SLP LONG TERM GOAL #4    Title The patient will participate in conversation, maintaining average loudness of 75 dB and loud, good quality voice.   Status Achieved          Plan - 06-06-2015 1541    Clinical Impression Statement Crystal Bass is self-monitoring loudness and is achieving a loud, clear voice in conversational speech.   Speech Therapy Frequency 4x / week   Duration Other (comment)   Treatment/Interventions SLP instruction and feedback   Potential to Achieve Goals Good   Potential Considerations Ability to learn/carryover information;Cooperation/participation level;Previous level of function;Severity of impairments;Family/community support   SLP Home Exercise Plan LSVT-LOUD homework   Consulted and Agree with Plan of Care Patient          G-Codes - 06/06/2015 1547    Functional Assessment Tool Used LSVT-LOUD ptocol   Functional Limitations Voice   Voice Current Status (G9171) At least 1 percent but less than 20 percent impaired, limited or restricted   Voice Goal Status (B6389) At least 1 percent but less than 20 percent impaired, limited or restricted   Voice Discharge Status (H7342) At least 1 percent but less than 20 percent impaired, limited or restricted      Problem List Patient Active Problem List   Diagnosis Date Noted  . Tremor 02/07/2015  . Dysphagia, pharyngoesophageal phase 02/07/2015  . Hypercalcemia 09/14/2013  . Unspecified vitamin D deficiency 04/27/2013  . Breast cancer, left breast 09/16/2012  . Osteoporosis, post-menopausal 03/14/2012  . Dyspnea 03/28/2011  . Cough 03/13/2011  . ATYPICAL MYCOBACTERIAL INFECTION 01/01/2011  . AORTIC ANEURYSM 01/01/2011  . Headache(784.0) 01/01/2011  . OSTEOARTHRITIS, CERVICAL SPINE 08/15/2009  . HYPERGLYCEMIA, FASTING 12/06/2008  . ALLERGIC RHINITIS 03/25/2008  . CHRONIC OBSTRUCTIVE ASTHMA UNSPECIFIED 03/25/2008  . BREAST CANCER, HX OF 03/10/2008  . Mixed hyperlipidemia 02/10/2007  . Essential hypertension 02/10/2007    Erma Pinto 06/06/2015, 3:48 PM  [place] MAIN [facility] SERVICES [address] [place], [place], [address] Phone: [phone]   Fax:  [phone]

## 2015-05-20 ENCOUNTER — Encounter: Payer: Self-pay | Admitting: Obstetrics & Gynecology

## 2015-05-20 ENCOUNTER — Ambulatory Visit (INDEPENDENT_AMBULATORY_CARE_PROVIDER_SITE_OTHER): Payer: Medicare Other | Admitting: Obstetrics & Gynecology

## 2015-05-20 VITALS — BP 126/82 | HR 64 | Resp 28 | Ht 62.5 in | Wt 156.0 lb

## 2015-05-20 DIAGNOSIS — C569 Malignant neoplasm of unspecified ovary: Secondary | ICD-10-CM

## 2015-05-20 DIAGNOSIS — Z66 Do not resuscitate: Secondary | ICD-10-CM | POA: Diagnosis not present

## 2015-05-20 DIAGNOSIS — Z01419 Encounter for gynecological examination (general) (routine) without abnormal findings: Secondary | ICD-10-CM

## 2015-05-20 DIAGNOSIS — Z7189 Other specified counseling: Secondary | ICD-10-CM | POA: Diagnosis not present

## 2015-05-20 NOTE — Progress Notes (Signed)
74 y.o. G0P0000 WidowedCaucasianF here for annual exam.  Has been diagnosed with Parkinson's this yearly.  Seeing Dr. Carles Collet every three months.  On Levodopa/carbidopa and reports she was occasionally having really unusual dreams/night terrors.  Started on Clonazepam for tremors.  Stopped inhaler for COPD due to causing worse tremors.  Hasn't noticed any change in lungs/breathing/tolerance.  Days with high humidity are harder.    Pt has questions about completing DNR today.  Is clear this is what she desires.  Has already done her will with attorney Lorn Junes.  Also has health care power of attorney (it is her borther--Mark Kaleen Odea, [employer]), and living will.  All of this has been officially signed and filed.    Moved to [place] early this year.  Very happy with the move.  No vaginal bleeding.  PCP:  Dr. Linna Darner.  Last appt 3/16.  No LMP recorded. Patient has had a hysterectomy.          Sexually active: No.  The current method of family planning is status post hysterectomy.    Exercising: Yes.    cardio Smoker:  no  Health Maintenance: Pap:  11/11 History of abnormal Pap:  no MMG:  02/25/15 right MMG-normal,  h/o left mastectomy Colonoscopy:  4/14-repeat in 5 years-Dr [name] BMD:   04/29/13-stable TDaP:  2012  Screening Labs: PCP, Hb today: PCP, Urine today: PCP   reports that she has never smoked. She has never used smokeless tobacco. She reports that she does not drink alcohol or use illicit drugs.  Past Medical History  Diagnosis Date  . Pancreatitis     secondary to Cholelithiasis  . Breast cancer 2002    infiltrative ductal carcinoma   . Lesion of breast 1992    right, benign  . Post-thoracotomy pain syndrome   . Uterine fibroid   . Endometriosis   . Ovarian cancer 2004  . Aneurysm of aorta     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  . Osteoporosis, post-menopausal 03/14/2012  . Ductal carcinoma of breast, estrogen receptor positive, stage 1 09/16/2012  . Pseudomonas  pneumonia 5/09  . COPD (chronic obstructive pulmonary disease) 9/08  . Mastalgia 7/95  . Lung disease     secondary to [name] infection  . Hypercalcemia 09/14/2013  . Vitamin D deficiency   . Aortic root aneurysm   . Parkinson disease 02/10/15    Past Surgical History  Procedure Laterality Date  . Lung biopsy  2002    [name], Dr [name]  . Cardiac catheterization  2007    essentially negation for significant CAD  . Appendectomy  2004  . Cholecystectomy  2001  . 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 Breast lumpectomy  1992    benign  . Mastectomy modified radical  2002    oral chemotheraphy (tamoxifen then Armidex) no radiation, Dr.[name]  . Tonsillectomy  1946  . Breast biopsy  08/2004    right breast-benign  . Colonoscopy  2014    Dr [name] , due 2019    Current Outpatient Prescriptions  Medication Sig Dispense Refill  . acetaminophen (TYLENOL) 325 MG tablet Take 1,000 mg by mouth 2 (two) times daily as needed.     [name] aspirin 81 MG tablet Take 81 mg by mouth daily.      . Calcium Carbonate-Vit D-Min (CALCIUM 600 + MINERALS) 600-200 MG-UNIT TABS Take 1 tablet by mouth 2 (two) times  daily.    . carbidopa-levodopa ([name]) 25-100 MG per tablet Take 1 tablet by mouth 3 (three) times daily. 90 tablet 3  . clonazePAM (KLONOPIN) 0.5 MG tablet Take 1/2 tablet nightly for dreams 30 tablet 1  . Diclofenac Potassium (CAMBIA) 50 MG PACK Take 50 mg by mouth as needed. 27 each 0  . gabapentin ([name]) 300 MG capsule TAKE 1 TO 3 CAPSULES BY MOUTH EVERY 8 HOURS AS NEEDED 270 capsule 0  . losartan (COZAAR) 50 MG tablet TAKE 1 TABLET EVERY DAY 90 tablet 3  . metoprolol tartrate (LOPRESSOR) 25 MG tablet 1 by mouth twice a day 180 tablet 3  . Multiple Vitamin (MULTIVITAMIN) capsule Take 1 capsule by mouth daily.    . Vitamin D, Ergocalciferol, ([name]) 50000 UNITS CAPS capsule TAKE 1 CAPLET BY MOUTH EVERY WEEK 26 capsule 1  . zolendronic acid  (ZOMETA) 4 MG/5ML injection Inject 5 mLs (4 mg total) into the vein every 6 (six) months. 5 mL 0  . albuterol (PROAIR HFA) 108 (90 BASE) MCG/ACT inhaler Inhale 2 puffs into the lungs every 6 (six) hours as needed. (Patient not taking: Reported on 05/20/2015) 1 Inhaler 3   No current facility-administered medications for this visit.    Family History  Problem Relation Age of Onset  . Lung cancer Father   . Breast cancer Sister 30  . Colon cancer Sister   . Stroke Paternal Aunt     in mid 76s  . Osteoporosis Paternal Aunt   . Myasthenia gravis Paternal Aunt   . Emphysema Mother     was never a smoker  . Diabetes Neg Hx   . Heart disease Neg Hx     ROS:  Pertinent items are noted in HPI.  Otherwise, a comprehensive ROS was negative.  Exam:   BP 126/82 mmHg  Pulse 64  Resp 28  Ht 5' 2.5" (1.588 m)  Wt 156 lb (70.761 kg)  BMI 28.06 kg/m2  Weight change: stable   Height: 5' 2.5" (158.8 cm)  Ht Readings from Last 3 Encounters:  05/20/15 5' 2.5" (1.588 m)  05/17/15 5\' 3"  (1.6 m)  03/28/15 5' 2.5" (1.588 m)    General appearance: alert, cooperative and appears stated age Head: Normocephalic, without obvious abnormality, atraumatic Neck: no adenopathy, supple, symmetrical, trachea midline and thyroid normal to inspection and palpation Lungs: clear to auscultation bilaterally Breasts: Absent left breast--no masses or LAD, right breast without masses, skin changes, nipple retraction, or LAD Heart: regular rate and rhythm Abdomen: soft, non-tender; bowel sounds normal; no masses,  no organomegaly Extremities: extremities normal, atraumatic, no cyanosis or edema Skin: Skin color, texture, turgor normal. No rashes or lesions Lymph nodes: Cervical, supraclavicular, and axillary nodes normal. No abnormal inguinal nodes palpated Neurologic: Grossly normal   Pelvic: External genitalia:  no lesions              Urethra:  normal appearing urethra with no masses, tenderness or lesions               Bartholins and Skenes: normal                 Vagina: normal appearing vagina with normal color and discharge, no lesions              Cervix: absent              Pap taken: No. Bimanual Exam:  Uterus:  uterus absent  Adnexa: no mass, fullness, tenderness               Rectovaginal: Confirms               Anus:  normal sphincter tone, no lesions  Chaperone was present for exam.  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 On Zometa 4mg  every 6 months. Vit D deficiency Elevated lipids 4.0cm aortic root aneurysm. Stable on yearly CTs. Last measurement was 3.35mm. Noted initially in 2011 COPD. Followed by Dr. Lacie Draft. Parkinson's diagnosis this year--Dr. Carles Collet.  Seeing every three months Desired DNR status  P: Mammogram yearly  No pap smear obtained today due to guidelines Ca-125 today DNR order placed in EPIC per discussion with pt.  Forms to be filled out when staff member who is a Patent examiner is in office.  Pt will return one day next week and will call before she comes to ensure notary in office.   return annually or prn

## 2015-05-21 LAB — CA 125: CA 125: 13 U/mL (ref <35)

## 2015-05-22 DIAGNOSIS — Z66 Do not resuscitate: Secondary | ICD-10-CM | POA: Insufficient documentation

## 2015-05-23 ENCOUNTER — Encounter: Payer: Self-pay | Admitting: Physical Therapy

## 2015-05-23 ENCOUNTER — Ambulatory Visit: Payer: Medicare Other | Admitting: Physical Therapy

## 2015-05-23 DIAGNOSIS — R262 Difficulty in walking, not elsewhere classified: Secondary | ICD-10-CM

## 2015-05-23 DIAGNOSIS — G20 Parkinson's disease: Secondary | ICD-10-CM

## 2015-05-23 DIAGNOSIS — R490 Dysphonia: Secondary | ICD-10-CM | POA: Diagnosis not present

## 2015-05-23 NOTE — Therapy (Signed)
[place] MAIN [facility] SERVICES [address] [place], [place], [address] Phone: [phone]   Fax:  [phone]  Physical Therapy Treatment  Patient Details  Name: Crystal Bass MRN: [id number] Date of Birth: 1941-09-19 Referring Provider:  Hendricks Limes, MD  Encounter Date: 05/23/2015      PT End of Session - 05/23/15 1504    Visit Number 12   Number of Visits 17   Date for PT Re-Evaluation 05/25/15   PT Start Time 0300   PT Stop Time 0345   PT Time Calculation (min) 45 min   Activity Tolerance Patient tolerated treatment well      Past Medical History  Diagnosis Date  . Pancreatitis     secondary to Cholelithiasis  . Breast cancer 2002    infiltrative ductal carcinoma   . Lesion of breast 1992    right, benign  . Post-thoracotomy pain syndrome   . Uterine fibroid   . Endometriosis   . Ovarian cancer 2004  . Aneurysm of aorta     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  . Osteoporosis, post-menopausal 03/14/2012  . Ductal carcinoma of breast, estrogen receptor positive, stage 1 09/16/2012  . Pseudomonas pneumonia 5/09  . COPD (chronic obstructive pulmonary disease) 9/08  . Mastalgia 7/95  . Lung disease     secondary to [name] infection  . Hypercalcemia 09/14/2013  . Vitamin D deficiency   . Aortic root aneurysm   . Parkinson disease 02/10/15    Past Surgical History  Procedure Laterality Date  . Lung biopsy  2002    [name], Dr [name]  . Cardiac catheterization  2007    essentially negation for significant CAD  . Appendectomy  2004  . Cholecystectomy  2001  . 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 Breast lumpectomy  1992    benign  . Mastectomy modified radical  2002    oral chemotheraphy (tamoxifen then Armidex) no radiation, Dr.[name]  . Tonsillectomy  1946  . Breast biopsy  08/2004    right breast-benign  . Colonoscopy  2014    Dr [name] , due 2019    There  were no vitals filed for this visit.  Visit Diagnosis:  Parkinson disease  Difficulty walking      Subjective Assessment - 05/23/15 1457    Subjective Patient has been doing her exercises at home and doing fine.   Currently in Pain? No/denies          Floor to ceiling x 10 reps, side to side 5 reps, step and reach forward x 10 reps, step and reach backwards x 10, step and reach sideways x 10 , Rock and reach forward/backward x 10 , Rock and reach sideways x 10, functional tasks; 1 sit to stand 2 Pinching activities to mimic clipping her nails with clippers 3. hand coordination tasks 4 washing hair and drying hair.5. Writing tasks Max cueing needed to appropriately perform LSVT tasks with leg, hand, and head position. Fatigue with sit to stand but demonstrating more control CGA to SBA for safety with activities. Uses to increase intensity and amplitude of movements throughout session Patient is able to perform exercises with modeling and verbal correction and cueing to step in the correct position                        PT Education - 05/23/15  1503    Education provided Yes   Education Details LSVT BIG   Person(s) Educated Patient   Methods Explanation   Comprehension Verbalized understanding;Returned demonstration             PT Long Term Goals - 05/13/15 1632                Plan - 05/23/15 1508    Clinical Impression Statement Continues to have balance deficits typical with diagnosis. Patient performs intermediate level exercises without pain behaviors and needs verbal cuing for postural alignment and head positioning.   Pt will benefit from skilled therapeutic intervention in order to improve on the following deficits Decreased coordination;Difficulty walking;Impaired tone;Decreased activity tolerance;Decreased balance;Impaired flexibility;Decreased mobility;Decreased strength   PT Frequency 4x / week   PT Duration 4 weeks   PT  Treatment/Interventions Therapeutic activities;Therapeutic exercise;Gait training;Balance training   PT Next Visit Plan Continue to work on progressions of LSVT Big exercises; consider trying sit to stand on red foam mat or from lower sitting surface.   PT Home Exercise Plan LSVT BIG   Consulted and Agree with Plan of Care Patient        Problem List Patient Active Problem List   Diagnosis Date Noted  . DNR (do not resuscitate) 05/22/2015  . Tremor 02/07/2015  . Dysphagia, pharyngoesophageal phase 02/07/2015  . Hypercalcemia 09/14/2013  . Unspecified vitamin D deficiency 04/27/2013  . Breast cancer, left breast 09/16/2012  . Osteoporosis, post-menopausal 03/14/2012  . Dyspnea 03/28/2011  . Cough 03/13/2011  . ATYPICAL MYCOBACTERIAL INFECTION 01/01/2011  . AORTIC ANEURYSM 01/01/2011  . Headache(784.0) 01/01/2011  . OSTEOARTHRITIS, CERVICAL SPINE 08/15/2009  . HYPERGLYCEMIA, FASTING 12/06/2008  . ALLERGIC RHINITIS 03/25/2008  . CHRONIC OBSTRUCTIVE ASTHMA UNSPECIFIED 03/25/2008  . BREAST CANCER, HX OF 03/10/2008  . Mixed hyperlipidemia 02/10/2007  . Essential hypertension 02/10/2007    Alanson Puls 05/23/2015, 3:10 PM  [place] MAIN [facility] SERVICES [address] [place], [place], [address] Phone: [phone]   Fax:  [phone]

## 2015-05-24 ENCOUNTER — Ambulatory Visit: Payer: Medicare Other | Admitting: Physical Therapy

## 2015-05-24 ENCOUNTER — Encounter: Payer: Self-pay | Admitting: Physical Therapy

## 2015-05-24 DIAGNOSIS — R262 Difficulty in walking, not elsewhere classified: Secondary | ICD-10-CM

## 2015-05-24 DIAGNOSIS — R490 Dysphonia: Secondary | ICD-10-CM | POA: Diagnosis not present

## 2015-05-24 DIAGNOSIS — G20 Parkinson's disease: Secondary | ICD-10-CM

## 2015-05-24 NOTE — Therapy (Signed)
[place] MAIN [facility] SERVICES [place], [place], [address] Phone: [phone]   Fax:  [phone]  Physical Therapy Treatment/ DC Summary  Patient Details  Name: Crystal Bass MRN: [id number] Date of Birth: [birth year] Referring Provider:  Hendricks Limes, MD  Encounter Date: 05/24/2015      PT End of Session - 05/24/15 1542    Date for PT Re-Evaluation 05/25/15   PT Start Time 0300   PT Stop Time 0340   PT Time Calculation (min) 40 min      Past Medical History  Diagnosis Date  . Pancreatitis     secondary to Cholelithiasis  . Breast cancer 2002    infiltrative ductal carcinoma   . Lesion of breast 1992    right, benign  . Post-thoracotomy pain syndrome   . Uterine fibroid   . Endometriosis   . Ovarian cancer 2004  . Aneurysm of aorta     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  . Osteoporosis, post-menopausal 03/14/2012  . Ductal carcinoma of breast, estrogen receptor positive, stage 1 09/16/2012  . Pseudomonas pneumonia 5/09  . COPD (chronic obstructive pulmonary disease) 9/08  . Mastalgia 7/95  . Lung disease     secondary to [name] infection  . Hypercalcemia 09/14/2013  . Vitamin D deficiency   . Aortic root aneurysm   . Parkinson disease 02/10/15    Past Surgical History  Procedure Laterality Date  . Lung biopsy  2002    [name], Dr [name]  . Cardiac catheterization  2007    essentially negation for significant CAD  . Appendectomy  2004  . Cholecystectomy  2001  . 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 Breast lumpectomy  1992    benign  . Mastectomy modified radical  2002    oral chemotheraphy (tamoxifen then Armidex) no radiation, Dr.[name]  . Tonsillectomy  1946  . Breast biopsy  08/2004    right breast-benign  . Colonoscopy  2014    Dr [name] , due 2019    There were no vitals filed for this visit.  Visit Diagnosis:  Parkinson  disease  Difficulty walking      Subjective Assessment - 05/24/15 1510    Subjective Patient has a  migraine today.        patient outcome measures performed and educated about falls risk :   5 x sit to stand    7.38 sec     10 MW  1.73 m/sec    TUG n 4.97          TUG carry  5.63    TUG cognitive 5.34       6 MW    1520  Peg left 19.90    Peg right   16.56      All outcome measures made progress and patient falls outside of the falls risk category.                          PT Education - 05/24/15 1510    Education provided Yes   Person(s) Educated Patient   Methods Explanation   Comprehension Verbalized understanding             PT Long Term Goals - 05/24/15 1543    PT LONG TERM GOAL #1   Status Achieved   PT LONG TERM GOAL #2  Status Achieved   PT LONG TERM GOAL #3   Status Achieved   PT LONG TERM GOAL #4   Status Achieved               Plan - 2015-06-15 1543    Clinical Impression Statement Patient will be DC form therapy and continue wiht LSVT BIG at home.   Pt will benefit from skilled therapeutic intervention in order to improve on the following deficits Decreased coordination;Difficulty walking;Impaired tone;Decreased activity tolerance;Decreased balance;Impaired flexibility;Decreased mobility;Decreased strength   Rehab Potential Good   PT Frequency 4x / week   PT Duration 4 weeks   PT Treatment/Interventions Therapeutic activities;Therapeutic exercise;Gait training;Balance training          G-Codes - 2015/06/15 1544    Mobility: Walking and Moving Around Goal Status [phone]) 0 percent impaired, limited or restricted      Problem List Patient Active Problem List   Diagnosis Date Noted  . DNR (do not resuscitate) 05/22/2015  . Tremor 02/07/2015  . Dysphagia, pharyngoesophageal phase 02/07/2015  . Hypercalcemia 09/14/2013  . Unspecified vitamin D deficiency 04/27/2013  . Breast cancer, left breast 09/16/2012  .  Osteoporosis, post-menopausal 03/14/2012  . Dyspnea 03/28/2011  . Cough 03/13/2011  . ATYPICAL MYCOBACTERIAL INFECTION 01/01/2011  . AORTIC ANEURYSM 01/01/2011  . Headache(784.0) 01/01/2011  . OSTEOARTHRITIS, CERVICAL SPINE 08/15/2009  . HYPERGLYCEMIA, FASTING 12/06/2008  . ALLERGIC RHINITIS 03/25/2008  . CHRONIC OBSTRUCTIVE ASTHMA UNSPECIFIED 03/25/2008  . BREAST CANCER, HX OF 03/10/2008  . Mixed hyperlipidemia 02/10/2007  . Essential hypertension 02/10/2007    Alanson Puls 06/15/2015, 3:46 PM  [place] MAIN [facility] SERVICES [address] [place], [place], [address] Phone: [phone]   Fax:  [phone]

## 2015-05-26 ENCOUNTER — Telehealth: Payer: Self-pay | Admitting: *Deleted

## 2015-05-26 NOTE — Telephone Encounter (Signed)
Call to patient, left message to call back. 

## 2015-05-27 ENCOUNTER — Other Ambulatory Visit: Payer: Self-pay | Admitting: Internal Medicine

## 2015-05-27 NOTE — Telephone Encounter (Signed)
Patient returned call. Confirmation of emergency contact information for DNR form. Patient request her sister, Modesto Charon [phone] be listed as emergency contact. Will mail completed original form to patient. She is aware to post on refrigerator. Denies further questions. Patient is appreciative for facilitation of this process.    Routing to provider for final review. Patient agreeable to disposition. Will close encounter.

## 2015-06-27 ENCOUNTER — Other Ambulatory Visit: Payer: Self-pay | Admitting: Neurology

## 2015-06-27 NOTE — Telephone Encounter (Signed)
Carbidopa Levodopa 25/100 refill requested. Per last office note- patient to remain on medication. Refill approved and sent to patient's pharmacy.   

## 2015-07-29 ENCOUNTER — Other Ambulatory Visit: Payer: Self-pay | Admitting: Neurology

## 2015-07-29 MED ORDER — CARBIDOPA-LEVODOPA 25-100 MG PO TABS: 1.0000 | ORAL_TABLET | Freq: Three times a day (TID) | ORAL | Status: DC

## 2015-07-29 NOTE — Telephone Encounter (Signed)
Patient requests 90 day supply of Carbidopa Levodopa. RX sent to pharmacy.

## 2015-08-03 ENCOUNTER — Other Ambulatory Visit: Payer: Self-pay | Admitting: Internal Medicine

## 2015-08-03 ENCOUNTER — Encounter: Payer: Self-pay | Admitting: Internal Medicine

## 2015-08-03 ENCOUNTER — Ambulatory Visit (INDEPENDENT_AMBULATORY_CARE_PROVIDER_SITE_OTHER)
Admission: RE | Admit: 2015-08-03 | Discharge: 2015-08-03 | Disposition: A | Discharge status: Home / Self Care | Payer: Medicare Other | Source: Ambulatory Visit | Attending: Internal Medicine | Admitting: Internal Medicine

## 2015-08-03 ENCOUNTER — Ambulatory Visit (INDEPENDENT_AMBULATORY_CARE_PROVIDER_SITE_OTHER): Payer: Medicare Other | Admitting: Internal Medicine

## 2015-08-03 VITALS — BP 150/92 | HR 71 | Temp 97.7°F | Resp 18 | Wt 156.0 lb

## 2015-08-03 DIAGNOSIS — M5414 Radiculopathy, thoracic region: Secondary | ICD-10-CM

## 2015-08-03 DIAGNOSIS — M810 Age-related osteoporosis without current pathological fracture: Secondary | ICD-10-CM

## 2015-08-03 NOTE — Patient Instructions (Addendum)
Use an anti-inflammatory cream such as Aspercreme or Zostrix cream twice a day to the affected area as needed. In lieu of this warm moist compresses or  hot water bottle can be used. Do not apply ice .  Arthritis strength Tylenol is the safest medication to take at bedtime as it has no gastric or cardiovascular risk.   Your next office appointment will be determined based upon review of your pending  xrays  Those written interpretation of the lab results and instructions will be transmitted to you by My Chart   Critical results will be called.   Followup as needed for any active or acute issue. Please report any significant change in your symptoms.

## 2015-08-03 NOTE — Progress Notes (Signed)
Pre visit review using our clinic review tool, if applicable. No additional management support is needed unless otherwise documented below in the visit note. 

## 2015-08-03 NOTE — Progress Notes (Signed)
   Subjective:    Patient ID: Crystal Bass, female    DOB: Oct 19, 1941, 74 y.o.   MRN: [id number]  HPI   She describes right sided back pain for 3 weeks without specific trigger or injury. It's in the right mid posterior thorax. It is worse when she reaches posteriorly with her right upper extremity. It is typically sharp but  it resolves is dull. It varies in intensity from 4-8 on a 10 scale. When she is supine or sitting at rest it does decreased in intensity. It is worse in the left lateral decubitus position. She describes it as a "leathery sensation" in this area. Tylenol has helped. She takes gabapentin on a regular basis so she cannot ascertain whether this is beneficial for the back pain.  On 3 occasions while ambulating, she's had some weakness of her legs from hips to knees.   She also has some fatigue. She has unrelated occasional abdominal cramping with watery stool.  Also unrelated is nocturia 1.  She has chronic shortness of breath with scant green sputum.  She has a past medical history of osteoporosis.  Review of Systems   She denies fever, chills, sweats, weight loss.  She is not having numbness or tingling of the lower extremities.  Chest pain, palpitations, tachycardia,  paroxysmal nocturnal dyspnea, claudication or edema are absent.  Unexplained weight loss, abdominal pain, significant dyspepsia, dysphagia, melena, rectal bleeding, or persistently small caliber stools are denied.     Objective:   Physical Exam Pertinent or positive findings include: Mask like facies present. Scattered musical rhonchi are noted throughout the lungs. An S4 is noted. She has a resting tremor in her hands, greater on right than the left. She has pain attempting to lie down on the exam table in the right posterior thoracic area. Straight leg raising is negative bilaterally. She is able to walk on her heels and toes.   General appearance :adequately nourished; in no  distress.  Eyes: No conjunctival inflammation or scleral icterus is present.   Heart:  Normal rate and regular rhythm. S1 and S2 normal without gallop, murmur, click,or rub.    Lungs:No increased work of breathing.   Abdomen: bowel sounds normal, soft and non-tender without masses, organomegaly or hernias noted.  No guarding or rebound.   Vascular : all pulses equal ; no bruits present.  Skin:Warm & dry.  Intact without suspicious lesions or rashes ; no tenting or jaundice   Lymphatic: No lymphadenopathy is noted about the head, neck, axilla   Neuro: Strength, tone decreased. DTRs normal.      Assessment & Plan:  #1 acute thoracic radicular pain  #2 osteoporosis  #3 anaphylaxis with sulfa and rash/itching with hydrocodone  Plan: Imaging will be performed  She'll take arthritis strength Tylenol as per package insert.

## 2015-08-23 ENCOUNTER — Encounter: Payer: Self-pay | Admitting: Neurology

## 2015-08-23 ENCOUNTER — Ambulatory Visit (INDEPENDENT_AMBULATORY_CARE_PROVIDER_SITE_OTHER): Payer: Medicare Other | Admitting: Neurology

## 2015-08-23 VITALS — BP 130/88 | HR 61 | Ht 62.5 in | Wt 154.0 lb

## 2015-08-23 DIAGNOSIS — G4752 REM sleep behavior disorder: Secondary | ICD-10-CM | POA: Diagnosis not present

## 2015-08-23 DIAGNOSIS — G3184 Mild cognitive impairment, so stated: Secondary | ICD-10-CM

## 2015-08-23 DIAGNOSIS — G20 Parkinson's disease: Secondary | ICD-10-CM | POA: Diagnosis not present

## 2015-08-23 DIAGNOSIS — G43009 Migraine without aura, not intractable, without status migrainosus: Secondary | ICD-10-CM

## 2015-08-23 MED ORDER — CARBIDOPA-LEVODOPA 25-100 MG PO TABS: ORAL_TABLET | ORAL | Status: DC

## 2015-08-23 MED ORDER — CLONAZEPAM 0.5 MG PO TABS: 0.2500 mg | ORAL_TABLET | Freq: Every day | ORAL | Status: DC

## 2015-08-23 NOTE — Patient Instructions (Signed)
1. Increase Carbidopa Levodopa to 2 tablets in the morning, 1 tablet in the afternoon, 1 tablet in the evening.

## 2015-08-23 NOTE — Progress Notes (Signed)
Crystal Bass was seen today in the movement disorders clinic for neurologic consultation at the request of [name], MD.  The consultation is for the evaluation of tremor.  Pt states that it has been going on over a year.  It is mostly in the R hand and rarely in the L hand.  "It is almost constant but not quite."  She notices it when grasping the steering wheel.  Eating was troublesome but better now that d/c the symbicort.  Symbicort worsened tremor by 80%.  Paternal aunt with tremor.  05/17/15 update:  Last visit, I diagnosed the patient with Parkinson's disease and started her on carbidopa/levodopa 25/100, one tablet 3 times per day.  She states that for the first 6 weeks she had diarrhea but it got better and it went away.  She does think that the tremor is better but does know that stress will trigger the tremor.  She also states that speech therapy seems to set off tremor.  She was started in physical therapy at [facility].  She has noted that she is waking up from dreams yelling and acting out dreams.  She does have a long history of migraine headaches and last visit had wanted me to prescribe Imitrex.  I was very nervous that her age and did not feel that she was likely a triptan candidate.  I ended up ordering an MRI of the brain on 02/28/2015.  I reviewed this and there was evidence of moderate small vessel disease.  I subsequently told her that she was not a candidate for the triptans.  She also had an MRI of the cervical spine on a performed, 2016.  There was mild multilevel cervical spondylosis.  Unfortunately, she has had several headaches since last visit that were fairly severe.  She did see Dr. [name] since last visit in regards to dysphagia and she states that it was scheduled but she ended up having to reschedule it.  08/23/15 update:  The patient is following up today.  She remains on carbidopa/levodopa 25/100, one tablet 3 times per day (8:30am/2pm/10PM).  She does have some  diarrhea that she associates with taking the pill but feels it is better than when she first started taking it.  She also states that it takes her until mid afternoon to feel normal and just feels slow in the morning.  She was started on clonazepam last visit for REM behavior disorder and that has helped dramatically.  She has had some spells of weakness in which her legs give out.  It may happen a few times a week.  She does not relate that to the addition of clonazepam at all.  No falls.  No hallucinations.  She was exercising until her back started hurting again.   In regards to her migraines, I gave her a prescription for Cambia last visit.  She is not a candidate for the triptans any longer.  She picked it up but hasn't had to use it yet.  She is on neurontin 600 mg bid for post surgical pain (states that she had mastectomy and open lung biopsy and then had terrible paresthesias).  Is still like that if doesn't take the neurontin, especially if she has a bra on.  She does complain that she thinks she is not thinking as clear as she used to.   PREVIOUS MEDICATIONS: none to date  ALLERGIES:   Allergies  Allergen Reactions  . Sulfonamide Derivatives Anaphylaxis  . Topamax [Topiramate] Other (  See Comments)    Metabolic acidosis   . Biaxin [Clarithromycin] Other (See Comments)    pericarditis  . Hydrocodone Other (See Comments)    "hyper and climbing the walls"  . Motrin [Ibuprofen] Other (See Comments)    headaches  . Symbicort [Budesonide-Formoterol Fumarate]     02/07/15 tremor  . Percocet [Oxycodone-Acetaminophen] Itching and Rash  . Tape Itching and Rash    Use paper tape only  . Tetracyclines & Related Other (See Comments)    "immediate yeast infection"    CURRENT MEDICATIONS:  Outpatient Encounter Prescriptions as of 08/23/2015  Medication Sig  . acetaminophen (TYLENOL) 325 MG tablet Take 1,000 mg by mouth 2 (two) times daily as needed.   [name] aspirin 81 MG tablet Take 81 mg by mouth  daily.    . carbidopa-levodopa ([name]) 25-100 MG per tablet Take 1 tablet by mouth 3 (three) times daily.  . clonazePAM (KLONOPIN) 0.5 MG tablet Take 1/2 tablet nightly for dreams  . gabapentin ([name]) 300 MG capsule TAKE 1 TO 3 CAPSULES BY MOUTH EVERY 8 HOURS AS NEEDED (Patient taking differently: 2 tablets in the morning, 2 tablets at night)  . losartan (COZAAR) 50 MG tablet TAKE 1 TABLET EVERY DAY  . metoprolol tartrate (LOPRESSOR) 25 MG tablet 1 by mouth twice a day  . Vitamin D, Ergocalciferol, ([name]) 50000 UNITS CAPS capsule TAKE 1 CAPLET BY MOUTH EVERY WEEK  . zolendronic acid (ZOMETA) 4 MG/5ML injection Inject 5 mLs (4 mg total) into the vein every 6 (six) months.  . Diclofenac Potassium (CAMBIA) 50 MG PACK Take 50 mg by mouth as needed. (Patient not taking: Reported on 08/23/2015)   No facility-administered encounter medications on file as of 08/23/2015.    PAST MEDICAL HISTORY:   Past Medical History  Diagnosis Date  . Pancreatitis     secondary to Cholelithiasis  . Breast cancer 2002    infiltrative ductal carcinoma   . Lesion of breast 1992    right, benign  . Post-thoracotomy pain syndrome   . Uterine fibroid   . Endometriosis   . Ovarian cancer 2004  . Aneurysm of aorta     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  . Osteoporosis, post-menopausal 03/14/2012  . Ductal carcinoma of breast, estrogen receptor positive, stage 1 09/16/2012  . Pseudomonas pneumonia 5/09  . COPD (chronic obstructive pulmonary disease) 9/08  . Mastalgia 7/95  . Lung disease     secondary to [name] infection  . Hypercalcemia 09/14/2013  . Vitamin D deficiency   . Aortic root aneurysm   . Parkinson disease 02/10/15    PAST SURGICAL HISTORY:   Past Surgical History  Procedure Laterality Date  . Lung biopsy  2002    [name], Dr [name]  . Cardiac catheterization  2007    essentially negation for significant CAD  . Appendectomy  2004  . Cholecystectomy  2001  . Abdominal hysterectomy        & BSO for Mucinous borderline tumor of R ovary 2004  . Breast lumpectomy  1992    benign  . Mastectomy modified radical  2002    oral chemotheraphy (tamoxifen then Armidex) no radiation, Dr.[name]  . Tonsillectomy  1946  . Breast biopsy  08/2004    right breast-benign  . Colonoscopy  2014    Dr [name] , due 2019    SOCIAL HISTORY:   Social History   Social History  . Marital Status: Widowed    Spouse Name: N/A  .  Number of Children: 0  . Years of Education: N/A   Occupational History  . 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 Smoking status: Never Smoker   . Smokeless tobacco: Never Used  . Alcohol Use: No  . Drug Use: No  . Sexual Activity: No     Comment: TAH/BSO   Other Topics Concern  . Not on file   Social History Narrative   Caregiver for >1 yo mother in law living with pt    FAMILY HISTORY:   Family Status  Relation Status Death Age  . Father Deceased     lung cancer  . Sister Alive     colon/breast cancer  . Mother Deceased     emphysema  . Brother Alive     healthy    ROS:  A complete 10 system review of systems was obtained and was unremarkable apart from what is mentioned above.  PHYSICAL EXAMINATION:    VITALS:   Filed Vitals:   08/23/15 1019  BP: 130/88  Pulse: 61  Height: 5' 2.5" (1.588 m)  Weight: 154 lb (69.854 kg)    GEN:  The patient appears stated age and is in NAD. HEENT:  Normocephalic, atraumatic.  The mucous membranes are moist. The superficial temporal arteries are without ropiness or tenderness. CV:  RRR Lungs:  CTAB Neck/HEME:  There are no carotid bruits bilaterally.  Neurological examination:  Orientation: The patient is alert and oriented x3. Fund of knowledge is appropriate.  Recent and remote memory are intact.  Attention and concentration are normal.    Able to name objects and repeat phrases. Cranial nerves: There is good facial symmetry  with the exception of the fact that when asked to raise the eyebrows, the right eyebrow raises further than the left.  There is mild facial hypomimia. The speech is fluent and clear. Soft palate rises symmetrically and there is no tongue deviation. Hearing is intact to conversational tone. Sensation: Sensation is intact to light touch throughout. Motor: Strength is 5/5 in the bilateral upper and lower extremities.   Shoulder shrug is equal and symmetric.  There is no pronator drift.   Movement examination: Tone: There is increased (mild-mod) in the RUE.  There is normal tone in the LUE Abnormal movements: There is a mild right upper extremity resting tremor. Coordination:  There is mild decremation seen with finger taps and alternation of supination/pronation on the right.  There is trouble with toe taps on the L Gait and Station: The patient has no difficulty arising out of a deep-seated chair without the use of the hands. The patient's stride length is normal, and good (but purposeful) arm swing  ASSESSMENT/PLAN:  1.   idiopathic Parkinson's disease.  The patient has tremor, bradykinesia, rigidity and mild postural instability.  Sx's have been present for at least since March, 2015 by the patients account today.  She is Hoehn and Yahr stage 2-2.5.    -Increased carbidopa/levodopa 25/100, to 2 tablets in the morning and continue 1 in the afternoon and one in the evening, but I asked her to move the dosages closer together so that she takes them at 8:30 AM, 12:30 PM and 5:30 PM.  She is currently taking her last dose at 10 PM.  If that does not help the feeling that she does not do well in the morning, we may need to add carbidopa/levodopa 50/200 at bedtime.  We discussed again how to  take this in relationship to mealtime and protein. 2.  Mild dysphagia  -The patient states that this is no longer a problem 3.  Long hx migraine  -No longer a candidate for triptan therapy.  This is due to cerebral  small vessel disease.  She does have Cambia for when necessary use. 4.  Neck pain/hyperreflexia  -MRI cervical spine in April, 2016 just demonstrated mild multilevel cervical spondylosis. 5.  REM behavior disorder  -Doing well with low-dose clonazepam, 0.5 mg, half tablet at night.  Discussed risks, benefits, side effects.  Understanding is expressed. 6.  Mild cognitive impairment  -Talked to her about the fact that I saw no evidence of dementia today, although it can develop in the future.  She and I talked about the differences between mild cognitive impairment and Parkinson's related dementia. 7.  Follow-up in the next few months, sooner should new neurologic issues arise.  Greater than 50% to 40 minute visit in counseling.  She asked multiple questions today and I answered them to the best of my ability.

## 2015-08-30 ENCOUNTER — Telehealth: Payer: Self-pay | Admitting: Hematology and Oncology

## 2015-08-30 NOTE — Telephone Encounter (Signed)
returned call and s.w. pt and r/s appt per pt request...ok and aware

## 2015-09-20 ENCOUNTER — Ambulatory Visit: Payer: Medicare Other

## 2015-09-20 ENCOUNTER — Other Ambulatory Visit: Payer: Medicare Other

## 2015-09-27 ENCOUNTER — Other Ambulatory Visit (HOSPITAL_BASED_OUTPATIENT_CLINIC_OR_DEPARTMENT_OTHER): Payer: Medicare Other

## 2015-09-27 ENCOUNTER — Ambulatory Visit (HOSPITAL_BASED_OUTPATIENT_CLINIC_OR_DEPARTMENT_OTHER): Payer: Medicare Other

## 2015-09-27 DIAGNOSIS — C50912 Malignant neoplasm of unspecified site of left female breast: Secondary | ICD-10-CM

## 2015-09-27 DIAGNOSIS — M810 Age-related osteoporosis without current pathological fracture: Secondary | ICD-10-CM

## 2015-09-27 DIAGNOSIS — Z853 Personal history of malignant neoplasm of breast: Secondary | ICD-10-CM | POA: Diagnosis not present

## 2015-09-27 LAB — CBC WITH DIFFERENTIAL/PLATELET
BASO%: 1.3 % (ref 0.0–2.0)
BASOS ABS: 0.1 10*3/uL (ref 0.0–0.1)
EOS%: 4.6 % (ref 0.0–7.0)
Eosinophils Absolute: 0.3 10*3/uL (ref 0.0–0.5)
HCT: 41.5 % (ref 34.8–46.6)
HEMOGLOBIN: 13.6 g/dL (ref 11.6–15.9)
LYMPH%: 18.4 % (ref 14.0–49.7)
MCH: 31.1 pg (ref 25.1–34.0)
MCHC: 32.6 g/dL (ref 31.5–36.0)
MCV: 95.4 fL (ref 79.5–101.0)
MONO#: 0.6 10*3/uL (ref 0.1–0.9)
MONO%: 10.8 % (ref 0.0–14.0)
NEUT#: 3.8 10*3/uL (ref 1.5–6.5)
NEUT%: 64.9 % (ref 38.4–76.8)
PLATELETS: 181 10*3/uL (ref 145–400)
RBC: 4.35 10*6/uL (ref 3.70–5.45)
RDW: 14.2 % (ref 11.2–14.5)
WBC: 5.9 10*3/uL (ref 3.9–10.3)
lymph#: 1.1 10*3/uL (ref 0.9–3.3)

## 2015-09-27 LAB — COMPREHENSIVE METABOLIC PANEL (CC13)
ALBUMIN: 3.6 g/dL (ref 3.5–5.0)
ALK PHOS: 75 U/L (ref 40–150)
ANION GAP: 7 meq/L (ref 3–11)
AST: 13 U/L (ref 5–34)
BILIRUBIN TOTAL: 1.12 mg/dL (ref 0.20–1.20)
BUN: 19.7 mg/dL (ref 7.0–26.0)
CO2: 26 meq/L (ref 22–29)
CREATININE: 0.8 mg/dL (ref 0.6–1.1)
Calcium: 9.3 mg/dL (ref 8.4–10.4)
Chloride: 107 mEq/L (ref 98–109)
EGFR: 71 mL/min/{1.73_m2} — AB (ref >90)
GLUCOSE: 80 mg/dL (ref 70–140)
Potassium: 4.2 mEq/L (ref 3.5–5.1)
SODIUM: 140 meq/L (ref 136–145)
TOTAL PROTEIN: 6.7 g/dL (ref 6.4–8.3)

## 2015-09-27 MED ORDER — ZOLEDRONIC ACID 4 MG/100ML IV SOLN
4.0000 mg | Freq: Once | INTRAVENOUS | Status: AC
  Administered 2015-09-27: 4 mg via INTRAVENOUS
  Filled 2015-09-27: qty 100

## 2015-09-27 MED ORDER — SODIUM CHLORIDE 0.9 % IV SOLN
Freq: Once | INTRAVENOUS | Status: AC
  Administered 2015-09-27: 12:00:00 via INTRAVENOUS

## 2015-09-27 NOTE — Patient Instructions (Signed)

## 2015-10-01 ENCOUNTER — Other Ambulatory Visit: Payer: Self-pay | Admitting: Obstetrics and Gynecology

## 2015-10-02 ENCOUNTER — Other Ambulatory Visit: Payer: Self-pay | Admitting: Internal Medicine

## 2015-10-03 MED ORDER — GABAPENTIN 300 MG PO CAPS: ORAL_CAPSULE | ORAL | Status: DC

## 2015-10-03 NOTE — Addendum Note (Signed)
Addended by: Earnstine Regal on: 10/03/2015 04:25 PM   Modules accepted: Orders

## 2015-10-03 NOTE — Telephone Encounter (Signed)
Medication refill request: Vitamin D Last AEX:  05/20/15 SM Next AEX: 08/21/16 SM Last MMG (if hormonal medication request): 02/25/15 [id number]:neg Refill authorized: 08/23/14 #[id number]. Today please advise.

## 2015-10-03 NOTE — Telephone Encounter (Signed)
Resent to [facility]../l;mb 

## 2015-11-07 ENCOUNTER — Telehealth: Payer: Self-pay

## 2015-11-07 NOTE — Telephone Encounter (Signed)
Call to intro [facility] and stated that she plans to change her care to [facility] as she is from [place]. Just seen md here and would prefer to hold on coming back unless necessary for acute need;

## 2015-11-29 ENCOUNTER — Ambulatory Visit (INDEPENDENT_AMBULATORY_CARE_PROVIDER_SITE_OTHER): Payer: Medicare Other | Admitting: Neurology

## 2015-11-29 ENCOUNTER — Encounter: Payer: Self-pay | Admitting: Neurology

## 2015-11-29 ENCOUNTER — Telehealth: Payer: Self-pay | Admitting: Neurology

## 2015-11-29 VITALS — BP 110/82 | HR 64 | Ht 62.5 in | Wt 157.0 lb

## 2015-11-29 DIAGNOSIS — K5901 Slow transit constipation: Secondary | ICD-10-CM

## 2015-11-29 DIAGNOSIS — G3184 Mild cognitive impairment, so stated: Secondary | ICD-10-CM

## 2015-11-29 DIAGNOSIS — G4752 REM sleep behavior disorder: Secondary | ICD-10-CM

## 2015-11-29 DIAGNOSIS — G20 Parkinson's disease: Secondary | ICD-10-CM

## 2015-11-29 DIAGNOSIS — G43009 Migraine without aura, not intractable, without status migrainosus: Secondary | ICD-10-CM

## 2015-11-29 NOTE — Progress Notes (Signed)
Crystal Bass was seen today in the movement disorders clinic for neurologic consultation at the request of Crystal Cobble, MD.  The consultation is for the evaluation of tremor.  Pt states that it has been going on over a year.  It is mostly in the R hand and rarely in the L hand.  "It is almost constant but not quite."  She notices it when grasping the steering wheel.  Eating was troublesome but better now that d/c the symbicort.  Symbicort worsened tremor by 80%.  Paternal aunt with tremor.  05/17/15 update:  Last visit, I diagnosed the patient with Parkinson's disease and started her on carbidopa/levodopa 25/100, one tablet 3 times per day.  She states that for the first 6 weeks she had diarrhea but it got better and it went away.  She does think that the tremor is better but does know that stress will trigger the tremor.  She also states that speech therapy seems to set off tremor.  She was started in physical therapy at [facility].  She has noted that she is waking up from dreams yelling and acting out dreams.  She does have a long history of migraine headaches and last visit had wanted me to prescribe Imitrex.  I was very nervous that her age 75 and did not feel that she was likely a triptan candidate.  I ended up ordering an MRI of the brain on 02/28/2015.  I reviewed this and there was evidence of moderate small vessel disease.  I subsequently told her that she was not a candidate for the triptans.  She also had an MRI of the cervical spine on a performed, 2016.  There was mild multilevel cervical spondylosis.  Unfortunately, she has had several headaches since last visit that were fairly severe.  She did see Dr. [name] since last visit in regards to dysphagia and she states that it was scheduled but she ended up having to reschedule it.  08/23/15 update:  The patient is following up today.  She remains on carbidopa/levodopa 25/100, one tablet 3 times per day (8:30am/2pm/10PM).  She does have some  diarrhea that she associates with taking the pill but feels it is better than when she first started taking it.  She also states that it takes her until mid afternoon to feel normal and just feels slow in the morning.  She was started on clonazepam last visit for REM behavior disorder and that has helped dramatically.  She has had some spells of weakness in which her legs give out.  It may happen a few times a week.  She does not relate that to the addition of clonazepam at all.  No falls.  No hallucinations.  She was exercising until her back started hurting again.   In regards to her migraines, I gave her a prescription for Cambia last visit.  She is not a candidate for the triptans any longer.  She picked it up but hasn't had to use it yet.  She is on neurontin 600 mg bid for post surgical pain (states that she had mastectomy and open lung biopsy and then had terrible paresthesias).  Is still like that if doesn't take the neurontin, especially if she has a bra on.  She does complain that she thinks she is not thinking as clear as she used to.  11/28/14 update:  The patient is following up today.  Last visit, I increased her carbidopa/levodopa so that she is taking carbidopa/levodopa 25/100, 2  tablets in the morning, one in the afternoon and one in the evening.  I was hoping that this would help her morning "on."  Today, the patient states that she just has no energy and she is unsure if it is related to the medication; it started before the medication increase.  She thinks that the energy does get a bit better throughout the day.  She is on the klonopin and "I don't want to give that up as I don't dream anymore."  She has so little energy that she has not exercised much.  Does want to go back to PT at [facility].  She is not snoring at night that she knows of.  She's had no falls since last visit.  No hallucinations.  No lightheadedness or near syncope.  She has a has a history of rare migraine.  She does have  Cambia at home but has not had to use that.  She had a "bad" headache last night but got rid of it with tylenol, aleve, and coke.  Also c/o issues with BM's - both constipation and diarrhea.   PREVIOUS MEDICATIONS: none to date  ALLERGIES:   Allergies  Allergen Reactions  . Sulfonamide Derivatives Anaphylaxis  . Topamax [Topiramate] Other (See Comments)    Metabolic acidosis   . Biaxin [Clarithromycin] Other (See Comments)    pericarditis  . Hydrocodone Other (See Comments)    "hyper and climbing the walls"  . Motrin [Ibuprofen] Other (See Comments)    headaches  . Symbicort [Budesonide-Formoterol Fumarate]     02/07/15 tremor  . Percocet [Oxycodone-Acetaminophen] Itching and Rash  . Tape Itching and Rash    Use paper tape only  . Tetracyclines & Related Other (See Comments)    "immediate yeast infection"    CURRENT MEDICATIONS:  Outpatient Encounter Prescriptions as of 73/01/2016  Medication Sig  . acetaminophen (TYLENOL) 325 MG tablet Take 1,000 mg by mouth 2 (two) times daily as needed.   [name] aspirin 81 MG tablet Take 81 mg by mouth daily.    . carbidopa-levodopa ([name]) 25-100 MG tablet 2 tablets in the morning, 1 tablet in the afternoon, 1 in the evening  . clonazePAM (KLONOPIN) 0.5 MG tablet Take 0.5-1 tablets (0.25-0.5 mg total) by mouth at bedtime. Take 1/2 tablet nightly for dreams  . gabapentin ([name]) 300 MG capsule TAKE 1-3 CAPSULES (300-900 MG TOTAL) BY MOUTH 3 (THREE) TIMES DAILY. (Patient taking differently: 1 tablet in the morning, 2 in the evening)  . losartan (COZAAR) 50 MG tablet TAKE 1 TABLET EVERY DAY  . metoprolol tartrate (LOPRESSOR) 25 MG tablet 1 by mouth twice a day  . Vitamin D, Ergocalciferol, ([name]) 50000 UNITS CAPS capsule TAKE 1 CAPSULE BY MOUTH EVERY WEEK  . zolendronic acid (ZOMETA) 4 MG/5ML injection Inject 5 mLs (4 mg total) into the vein every 6 (six) months.  . [DISCONTINUED] Diclofenac Potassium (CAMBIA) 50 MG PACK Take 50 mg by mouth  as needed. (Patient not taking: Reported on 08/23/2015)  . [DISCONTINUED] gabapentin ([name]) 300 MG capsule TAKE 1 TO 3 CAPSULES BY MOUTH EVERY 8 HOURS AS NEEDED (Patient taking differently: 2 tablets in the morning, 2 tablets at night)   No facility-administered encounter medications on file as of 73/01/2016.    PAST MEDICAL HISTORY:   Past Medical History  Diagnosis Date  . Pancreatitis     secondary to Cholelithiasis  . Breast cancer ([place]) 2002    infiltrative ductal carcinoma   . Lesion of breast 1992  right, benign  . Post-thoracotomy pain syndrome   . Uterine fibroid   . Endometriosis   . Ovarian cancer ([place]) 2004  . Aneurysm of aorta ([facility])     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  . Osteoporosis, post-menopausal 03/14/2012  . Ductal carcinoma of breast, estrogen receptor positive, stage 1 ([place]) 09/16/2012  . Pseudomonas pneumonia ([place]) 5/09  . COPD (chronic obstructive pulmonary disease) ([place]) 9/08  . Mastalgia 7/95  . Lung disease     secondary to [name] infection  . Hypercalcemia 09/14/2013  . Vitamin D deficiency   . Aortic root aneurysm ([place])   . Parkinson disease ([place]) 02/10/15    PAST SURGICAL HISTORY:   Past Surgical History  Procedure Laterality Date  . Lung biopsy  2002    [name], Dr [name]  . Cardiac catheterization  2007    essentially negation for significant CAD  . Appendectomy  2004  . Cholecystectomy  2001  . 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 Breast lumpectomy  1992    benign  . Mastectomy modified radical  2002    oral chemotheraphy (tamoxifen then Armidex) no radiation, Dr.[name]  . Tonsillectomy  1946  . Breast biopsy  08/2004    right breast-benign  . Colonoscopy  2014    Dr [name] , due 2019    SOCIAL HISTORY:   Social History   Social History  . Marital Status: Widowed    Spouse Name: N/A  . Number of Children: 0  . Years of Education: N/A   Occupational History  . 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 Smoking status: Never Smoker   . Smokeless tobacco: Never Used  . Alcohol Use: No  . Drug Use: No  . Sexual Activity: No     Comment: TAH/BSO   Other Topics Concern  . Not on file   Social History Narrative   Caregiver for >46 yo mother in law living with pt    FAMILY HISTORY:   Family Status  Relation Status Death Age  . Father Deceased     lung cancer  . Sister Alive     colon/breast cancer  . Mother Deceased     emphysema  . Brother Alive     healthy    ROS:  A complete 10 system review of systems was obtained and was unremarkable apart from what is mentioned above.  PHYSICAL EXAMINATION:    VITALS:   Filed Vitals:   11/29/15 1044  BP: 110/82  Pulse: 64  Height: 5' 2.5" (1.588 m)  Weight: 157 lb (71.215 kg)    GEN:  The patient appears stated age and is in NAD. HEENT:  Normocephalic, atraumatic.  The mucous membranes are moist. The superficial temporal arteries are without ropiness or tenderness. CV:  RRR Lungs:  CTAB Neck/HEME:  There are no carotid bruits bilaterally.  Neurological examination:  Orientation: The patient is alert and oriented x3. Fund of knowledge is appropriate.  Recent and remote memory are intact.  Attention and concentration are normal.    Able to name objects and repeat phrases. Cranial nerves: There is good facial symmetry with the exception of the fact that when asked to raise the eyebrows, the right eyebrow raises further than the left.  There is mild facial hypomimia. The speech is fluent and clear. Soft palate rises symmetrically and there  is no tongue deviation. Hearing is intact to conversational tone. Sensation: Sensation is intact to light touch throughout. Motor: Strength is 5/5 in the bilateral upper and lower extremities.   Shoulder shrug is equal and symmetric.  There is no pronator drift.   Movement examination: Tone: There is  normal tone bilaterally. Abnormal movements: There is a rare and intermittent right upper extremity resting tremor. Coordination:  There is good rapid alternating movements today. Gait and Station: The patient has no difficulty arising out of a deep-seated chair without the use of the hands. The patient's stride length is normal, and good (but purposeful) arm swing  LABS  Lab Results  Component Value Date   WBC 5.9 09/27/2015   HGB 13.6 09/27/2015   HCT 41.5 09/27/2015   MCV 95.4 09/27/2015   PLT 181 09/27/2015     Chemistry      Component Value Date/Time   NA 140 09/27/2015 1133   NA 138 02/09/2015 0853   K 4.2 09/27/2015 1133   K 4.2 02/09/2015 0853   CL 103 02/09/2015 0853   CL 102 03/10/2013 1529   CO2 26 09/27/2015 1133   CO2 29 02/09/2015 0853   BUN 19.7 09/27/2015 1133   BUN 21 02/09/2015 0853   CREATININE 0.8 09/27/2015 1133   CREATININE 0.86 02/09/2015 0853      Component Value Date/Time   CALCIUM 9.3 09/27/2015 1133   CALCIUM 9.7 02/09/2015 0853   ALKPHOS 75 09/27/2015 1133   ALKPHOS 69 02/09/2015 0853   AST 13 09/27/2015 1133   AST 19 02/09/2015 0853   ALT <9 09/27/2015 1133   ALT 15 02/09/2015 0853   BILITOT 1.12 09/27/2015 1133   BILITOT 1.1 02/09/2015 0853       ASSESSMENT/PLAN:  1.   idiopathic Parkinson's disease.  The patient has tremor, bradykinesia, rigidity and mild postural instability.  Sx's have been present for at least since March, 2015 by the patients account today.  She is Hoehn and Yahr stage 2-2.5.    -She is not sure what is causing loss of energy.  It may be the disease itself, but it could be the medications.  I am going to have her hold her levodopa just for 2 days and we will call her on Friday morning to see if her energy level was any better.  I did warn her that she will likely have more stiffness and tremor over the next few days.  If energy level is better, then I may change her to the CR formulation.  If energy has not changed,  then I will likely hold clonazepam for a few days (she is very worried about this as it has helped dreams).  If none of these things help, then I may add selegiline.   -Referral for physical therapy.  -I actually think she looks much better on carbidopa/levodopa 25/100, 2/1/1 (less tremor, less rigidity and less bradykinesia), but she does not think she is better and ultimately wants to go back to 1 tablet 3 times a day.  I don't have a huge objection but we can make final decision after we trial the above.  -rancho recipe given for PD related constipation 2.  Mild dysphagia  -The patient states that this is no longer a problem 3.  Long hx migraine  -No longer a candidate for triptan therapy.  This is due to cerebral small vessel disease.  She does have Cambia for when necessary use.  Encouraged her to try it as  she did have a headache the other day and was too nervous to take it. 4.  Neck pain/hyperreflexia  -MRI cervical spine in April, 2016 just demonstrated mild multilevel cervical spondylosis. 5.  REM behavior disorder  -Doing well with low-dose clonazepam, 0.5 mg, half tablet at night.  Discussed risks, benefits, side effects.  Understanding is expressed. 6.  Mild cognitive impairment  -Talked to her about the fact that I saw no evidence of dementia today, although it can develop in the future.  She and I talked about the differences between mild cognitive impairment and Parkinson's related dementia. 7.  Follow-up in the next few months, sooner should new neurologic issues arise.

## 2015-11-29 NOTE — Telephone Encounter (Signed)
-----   Message from [place], DO sent at 11/29/2015 12:01 PM EST ----- Send order to [facility] for PT for PD

## 2015-11-29 NOTE — Patient Instructions (Signed)
Bowel movements and  Parkinson's disease:  1.Rancho recipe for constipation in Parkinsons Disease:  -1 cup of bran, 2 cups of applesauce in 1 cup of prune juice 2.  Increase fiber intake (Metamucil,vegetables) 3.  Regular, moderate exercise can be beneficial. 4.  Hold carbidopa/levodopa 25/100 until Friday AM and we will call you and see how you are doing

## 2015-11-29 NOTE — Telephone Encounter (Signed)
Referral entered and faxed to [phone]

## 2015-12-02 ENCOUNTER — Telehealth: Payer: Self-pay | Admitting: Neurology

## 2015-12-02 NOTE — Telephone Encounter (Signed)
Spoke with patient to see how tiredness is doing after holding Levodopa. She states it was the same yesterday, but feels she has more energy today. She wants to give the trial more time to get a better picture. She will stay off Levodopa this weekend and we will talk Monday to see how her energy level is doing.

## 2015-12-07 ENCOUNTER — Telehealth: Payer: Self-pay | Admitting: Neurology

## 2015-12-07 NOTE — Telephone Encounter (Signed)
Spoke with patient after she stated she needed a longer trial off Levodopa to tell whether her energy level was different. She states she can't tell any difference in her energy level. She will go back on Carbidopa Levodopa IR. I advised the next step would be to hold clonazepam to see if her energy levels are different. She does not want to do this. She states without it she has horrible night terrors and wakes up screaming - she does not even want to try holding it for a couple days. She states she will deal with the lack of energy by increasing activity. She will call back if needed.

## 2015-12-07 NOTE — Telephone Encounter (Signed)
VM-PT left message to get a call back/Dawn CB# [phone]

## 2015-12-07 NOTE — Telephone Encounter (Signed)
Patient called back and said she had changed her mind and she will try to go without clonazepam to see if this is the cause of daytime fatigue. She will stay off of this until Friday and I will touch base with her on Friday morning.

## 2015-12-12 ENCOUNTER — Telehealth: Payer: Self-pay | Admitting: Neurology

## 2015-12-12 MED ORDER — SELEGILINE HCL 5 MG PO TABS: 5.0000 mg | ORAL_TABLET | Freq: Two times a day (BID) | ORAL | Status: DC

## 2015-12-12 NOTE — Telephone Encounter (Signed)
Spoke with patient and she states that after stopping clonazepam since Wednesday that she hasn't noticed a major increase in energy. She could have slightly more, but she states not enough to outweigh the nightmares she has without the clonazepam. Please advise next steps.

## 2015-12-12 NOTE — Telephone Encounter (Signed)
Left message on machine for patient to call back.

## 2015-12-12 NOTE — Telephone Encounter (Signed)
Lets try selegeline, 5 mg bid (second dosage at noon and not after).  If doesn't help, may not be PD related

## 2015-12-12 NOTE — Telephone Encounter (Signed)
Patient made aware. She is okay with trying Selegiline. RX sent to pharmacy for one month. She will let us know if this helps. Aware she can go back on clonazepam and she is to continue Carbidopa Levodopa.

## 2016-01-03 ENCOUNTER — Encounter: Payer: Self-pay | Admitting: Occupational Therapy

## 2016-01-03 ENCOUNTER — Ambulatory Visit: Payer: Medicare Other | Attending: Neurology | Admitting: Occupational Therapy

## 2016-01-03 DIAGNOSIS — G20 Parkinson's disease: Secondary | ICD-10-CM | POA: Diagnosis not present

## 2016-01-03 DIAGNOSIS — R262 Difficulty in walking, not elsewhere classified: Secondary | ICD-10-CM | POA: Diagnosis present

## 2016-01-03 DIAGNOSIS — R460 Very low level of personal hygiene: Secondary | ICD-10-CM | POA: Diagnosis present

## 2016-01-03 DIAGNOSIS — R278 Other lack of coordination: Secondary | ICD-10-CM | POA: Diagnosis present

## 2016-01-03 DIAGNOSIS — M6281 Muscle weakness (generalized): Secondary | ICD-10-CM | POA: Diagnosis present

## 2016-01-03 DIAGNOSIS — Z741 Need for assistance with personal care: Secondary | ICD-10-CM

## 2016-01-04 NOTE — Therapy (Signed)
[place] MAIN [facility] SERVICES [address] [place], [place], [address] Phone: [phone]   Fax:  [phone]  Occupational Therapy Evaluation  Patient Details  Name: Crystal Bass MRN: [id number] Date of Birth: 03-09-1941 Referring Provider: Tat  Encounter Date: 01/03/2016      OT End of Session - 01/04/16 1436    Visit Number 1   Number of Visits 17   Date for OT Re-Evaluation 14-Feb-2016   Authorization Type G codes 1   OT Start Time [id number]   OT Stop Time 1400   OT Time Calculation (min) 57 min   Activity Tolerance Patient tolerated treatment well   Behavior During Therapy [facility] for tasks assessed/performed      Past Medical History  Diagnosis Date  . Pancreatitis     secondary to Cholelithiasis  . Breast cancer ([place]) 2002    infiltrative ductal carcinoma   . Lesion of breast 1992    right, benign  . Post-thoracotomy pain syndrome   . Uterine fibroid   . Endometriosis   . Ovarian cancer ([place]) 2004  . Aneurysm of aorta (H[facility])     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  . Osteoporosis, post-menopausal 03/14/2012  . Ductal carcinoma of breast, estrogen receptor positive, stage 1 ([place]) 09/16/2012  . Pseudomonas pneumonia ([place]) 5/09  . COPD (chronic obstructive pulmonary disease) ([place]) 9/08  . Mastalgia 7/95  . Lung disease     secondary to [name] infection  . Hypercalcemia 09/14/2013  . Vitamin D deficiency   . Aortic root aneurysm ([place])   . Parkinson disease ([place]) 02/10/15    Past Surgical History  Procedure Laterality Date  . Lung biopsy  2002    [name], Dr [name]  . Cardiac catheterization  2007    essentially negation for significant CAD  . Appendectomy  2004  . Cholecystectomy  2001  . 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 Breast lumpectomy  1992    benign  . Mastectomy modified radical  2002    oral chemotheraphy (tamoxifen then Armidex) no radiation, Dr.[name]  .  Tonsillectomy  1946  . Breast biopsy  08/2004    right breast-benign  . Colonoscopy  2014    Dr [name] , due 2019    There were no vitals filed for this visit.  Visit Diagnosis:  Parkinson disease ([place]) - Plan: Ot plan of care cert/re-cert  Difficulty walking - Plan: Ot plan of care cert/re-cert  Self-care deficit for hygiene - Plan: Ot plan of care cert/re-cert  Muscle weakness (generalized) - Plan: Ot plan of care cert/re-cert  Other lack of coordination - Plan: Ot plan of care cert/re-cert      Subjective Assessment - 01/04/16 1423    Subjective  Patient reports she has had a recent decline in terms of her day to day performance of activities and functional mobility.  She saw her MD who recommnended she come back for therapy.     Pertinent History Pt. completed LSVT BIG program last year and did well and has recently not been able to perform her daily exercises as she did in the past.     Patient Stated Goals Patient reports her goal is to get back to her previous baseline if possible, be independent, improve her grip, transfers, walking, and strength.     Currently in Pain? Yes   Pain Score 2    Pain  Location Head   Pain Orientation Left   Pain Descriptors / Indicators Headache   Pain Onset Today   Pain Frequency Once a week   Effect of Pain on Daily Activities Patient reports she has frequent migraines about 4 times a month which can interfere with her daily tasks at home.    Multiple Pain Sites No           [facility] OT Assessment - 01/03/16 1319    Assessment   Diagnosis Parkinson's disease   Referring Provider Tat   Onset Date 02/10/15   Prior Therapy yes   Balance Screen   Has the patient fallen in the past 6 months No   Has the patient had a decrease in activity level because of a fear of falling?  No   Is the patient reluctant to leave their home because of a fear of falling?  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  She reports low energy, muscle weakness, joint stiffness, decreased balance and "mushy brain".  She has not been consistent with performing her daily exercises in 2 months.    IADL   Prior Level of Function Shopping independent   Shopping Takes care of all shopping needs independently   Prior Level of Function Light Housekeeping independent   Light Housekeeping Does personal laundry completely;Performs light daily tasks such as dishwashing, bed making   Prior Level of Function Meal Prep independent   Meal Prep Plans, prepares and serves adequate meals independently   Prior Level of Function Community Mobility independent   [place] own vehicle   Medication Management Is responsible for taking medication in correct dosages at correct time   Prior Level of Function Financial Management independent   Physiological scientist financial matters independently (budgets, writes checks, pays rent, bills goes to  bank), collects and keeps track of income   Mobility   Mobility Status Independent   Written Expression   Dominant Hand Right   Handwriting Mild micrographia   Sensation   Light Touch Appears Intact   Stereognosis Appears Intact   Proprioception Appears Intact   Coordination   Gross Motor Movements are Fluid and Coordinated No   Fine Motor Movements are Fluid and Coordinated No   9 Hole Peg Test Right;Left   Right 9 Hole Peg Test 19   Left 9 Hole Peg Test 21   ROM / Strength   AROM / PROM / Strength AROM;Strength   AROM  Overall AROM  Other (comment)  mild resting tremor on the right   Overall AROM Comments BUE WFLs   Strength   Overall Strength Deficits   Overall Strength Comments Bilateral strength 4/5 overall   Hand Function   Right Hand Grip (lbs) 35   Right Hand Lateral Pinch 14 lbs   Right Hand 3 Point Pinch 10 lbs   Left Hand Grip (lbs) 32   Left Hand Lateral Pinch 7 lbs   Left 3 point pinch 10 lbs         5 times sit to stand 10 secs 6 minute walk test 1430 feet BERG 54/56 No reports or evidence of freezing of gait behaviors currently                OT Education - 01/04/16 1435    Education provided Yes   Education Details LSVT BIG, role of OT, maximal daily exercises.    Person(s) Educated Patient   Methods Explanation   Comprehension Verbalized understanding             OT Long Term Goals - 01/04/16 1443    OT LONG TERM GOAL #1   Title Patient will improve gait speed and endurance and be able to walk 1600 feet in 6 minutes to negotiate around the home and community safely in 4 weeks.   Baseline 1430 feet at evaluation   Time 4   Period Weeks   Status New   OT LONG TERM GOAL #2   Title Patient will complete HEP for maximal daily exercises with modified independence in 4 weeks   Baseline has not been consistent with HEP for the last 2 months and needs instruction   Time 4   Period Weeks   Status New   OT LONG TERM GOAL #3    Title Patient will transfer from sit to stand without the use of arms safely and independently from a variety of chairs/surfaces in 4 weeks.    Baseline difficulty from lower surfaces, 5 times sit to stand at eval 10 secs   Time 4   Period Weeks   Status New   OT LONG TERM GOAL #4   Title Patient will demonstrate 100% legibility with handwriting for name and address with use of normal sized letters   Baseline micrographia noted on eval   Time 4   Period Weeks   Status New   OT LONG TERM GOAL #5   Title Patient will complete hair care with modified independence, demonstrating reaching to top of head for 1-2 minutes at a time to complete task.   Baseline unable   Time 4   Period Weeks   Status New               Plan - 01/04/16 1437    Clinical Impression Statement Patient is a 75 yo female diagnosed with Parkinson's disease in 2016 and was referred by her physician for LSVT BIG program. Patient presents with mild decreased step length with gait patterns, decreased reciprocal arm swing especially on the RUE, decreased balance, decreased coordination UE/LE, mild tremors on RUE and decreased muscle strength which affect her ability to perform daily tasks including self-care. The patient is judged to be an excellent candidate for the LSVT BIG program. She would benefit from and was referred for the LSVT BIG program which is an intensive program designed specifically for Parkinson's patients with a focus on increasing amplitude and speed of movements, improving self-care and daily tasks and  providing patients with daily exercises to improve overall function. It is recommended that the patient receive the LSVT BIG program which is comprised of 16 intensive sessions (4 times per week for 4 weeks, one hour sessions). Prognosis for improvement is good based on her motivation. LSVT BIG has been documented in the literature as efficacious for individuals with Parkinson's disease.      Pt will  benefit from skilled therapeutic intervention in order to improve on the following deficits (Retired) Decreased endurance;Decreased strength;Impaired flexibility;Decreased balance;Decreased mobility;Difficulty walking;Decreased cognition;Pain;Decreased coordination;Impaired UE functional use   OT Frequency 4x / week   OT Duration 4 weeks   OT Treatment/Interventions Self-care/ADL training;Therapeutic exercise;Functional Mobility Training;Patient/family education;Neuromuscular education;Balance training;Therapeutic exercises;DME and/or AE instruction;Therapeutic activities;Gait Training;Stair Training   Plan Will plan to see patient 4 times a week for 4 weeks as per protocol for LSVT BIG program   Consulted and Agree with Plan of Care Patient          G-Codes - 01/25/16 1449    Functional Assessment Tool Used 5 times sit to stand, 6 minute walk test, self care assessment, clinical judgment   Functional Limitation Self care   Self Care Current Status [id number]) At least 1 percent but less than 20 percent impaired, limited or restricted   Self Care Goal Status [id number]) 0 percent impaired, limited or restricted      Problem List Patient Active Problem List   Diagnosis Date Noted  . DNR (do not resuscitate) 05/22/2015  . Tremor 02/07/2015  . Dysphagia, pharyngoesophageal phase 02/07/2015  . Hypercalcemia 09/14/2013  . Unspecified vitamin D deficiency 04/27/2013  . Breast cancer, left breast ([place]) 09/16/2012  . Osteoporosis, post-menopausal 03/14/2012  . Dyspnea 03/28/2011  . Cough 03/13/2011  . ATYPICAL MYCOBACTERIAL INFECTION 01/01/2011  . AORTIC ANEURYSM 01/01/2011  . Headache(784.0) 01/01/2011  . OSTEOARTHRITIS, CERVICAL SPINE 08/15/2009  . HYPERGLYCEMIA, FASTING 12/06/2008  . ALLERGIC RHINITIS 03/25/2008  . CHRONIC OBSTRUCTIVE ASTHMA UNSPECIFIED 03/25/2008  . BREAST CANCER, HX OF 03/10/2008  . Mixed hyperlipidemia 02/10/2007  . Essential hypertension 02/10/2007   Audric Venn T Tomasita Morrow,  OTR/L, CLT  Alera Quevedo 2016-01-25, 2:58 PM  [place] MAIN [facility] SERVICES [address] [place], [place], [address] Phone: [phone]   Fax:  [phone]  Name: Crystal Bass MRN: [id number] Date of Birth: 1941/06/22

## 2016-01-09 ENCOUNTER — Ambulatory Visit: Payer: Medicare Other | Admitting: Occupational Therapy

## 2016-01-09 DIAGNOSIS — G20 Parkinson's disease: Secondary | ICD-10-CM | POA: Diagnosis not present

## 2016-01-09 DIAGNOSIS — R460 Very low level of personal hygiene: Secondary | ICD-10-CM

## 2016-01-09 DIAGNOSIS — Z741 Need for assistance with personal care: Secondary | ICD-10-CM

## 2016-01-09 DIAGNOSIS — R262 Difficulty in walking, not elsewhere classified: Secondary | ICD-10-CM

## 2016-01-09 DIAGNOSIS — M6281 Muscle weakness (generalized): Secondary | ICD-10-CM

## 2016-01-09 DIAGNOSIS — R278 Other lack of coordination: Secondary | ICD-10-CM

## 2016-01-10 ENCOUNTER — Encounter: Payer: Self-pay | Admitting: Family Medicine

## 2016-01-10 ENCOUNTER — Ambulatory Visit (INDEPENDENT_AMBULATORY_CARE_PROVIDER_SITE_OTHER): Payer: Medicare Other | Admitting: Family Medicine

## 2016-01-10 VITALS — BP 128/80 | HR 62 | Temp 97.2°F | Wt 156.8 lb

## 2016-01-10 DIAGNOSIS — I10 Essential (primary) hypertension: Secondary | ICD-10-CM

## 2016-01-10 DIAGNOSIS — Z853 Personal history of malignant neoplasm of breast: Secondary | ICD-10-CM

## 2016-01-10 DIAGNOSIS — J45909 Unspecified asthma, uncomplicated: Secondary | ICD-10-CM

## 2016-01-10 DIAGNOSIS — C50912 Malignant neoplasm of unspecified site of left female breast: Secondary | ICD-10-CM

## 2016-01-10 DIAGNOSIS — J449 Chronic obstructive pulmonary disease, unspecified: Secondary | ICD-10-CM

## 2016-01-10 DIAGNOSIS — M810 Age-related osteoporosis without current pathological fracture: Secondary | ICD-10-CM

## 2016-01-10 DIAGNOSIS — E782 Mixed hyperlipidemia: Secondary | ICD-10-CM | POA: Diagnosis not present

## 2016-01-10 DIAGNOSIS — G20 Parkinson's disease: Secondary | ICD-10-CM

## 2016-01-10 DIAGNOSIS — Z66 Do not resuscitate: Secondary | ICD-10-CM

## 2016-01-10 LAB — CBC WITH DIFFERENTIAL/PLATELET
BASOS PCT: 0.8 % (ref 0.0–3.0)
Basophils Absolute: 0 10*3/uL (ref 0.0–0.1)
EOS PCT: 5.1 % — AB (ref 0.0–5.0)
Eosinophils Absolute: 0.3 10*3/uL (ref 0.0–0.7)
HCT: 40.3 % (ref 36.0–46.0)
HEMOGLOBIN: 13.4 g/dL (ref 12.0–15.0)
LYMPHS ABS: 1.3 10*3/uL (ref 0.7–4.0)
Lymphocytes Relative: 20.3 % (ref 12.0–46.0)
MCHC: 33.4 g/dL (ref 30.0–36.0)
MCV: 93.1 fl (ref 78.0–100.0)
MONOS PCT: 9.2 % (ref 3.0–12.0)
Monocytes Absolute: 0.6 10*3/uL (ref 0.1–1.0)
Neutro Abs: 4.2 10*3/uL (ref 1.4–7.7)
Neutrophils Relative %: 64.6 % (ref 43.0–77.0)
Platelets: 207 10*3/uL (ref 150.0–400.0)
RBC: 4.32 Mil/uL (ref 3.87–5.11)
RDW: 14.5 % (ref 11.5–15.5)
WBC: 6.5 10*3/uL (ref 4.0–10.5)

## 2016-01-10 LAB — COMPREHENSIVE METABOLIC PANEL
ALBUMIN: 4.2 g/dL (ref 3.5–5.2)
ALK PHOS: 68 U/L (ref 39–117)
ALT: 6 U/L (ref 0–35)
AST: 16 U/L (ref 0–37)
BILIRUBIN TOTAL: 1.2 mg/dL (ref 0.2–1.2)
BUN: 14 mg/dL (ref 6–23)
CO2: 30 mEq/L (ref 19–32)
Calcium: 9.7 mg/dL (ref 8.4–10.5)
Chloride: 102 mEq/L (ref 96–112)
Creatinine, Ser: 0.76 mg/dL (ref 0.40–1.20)
GFR: 79.33 mL/min (ref >60.00)
GLUCOSE: 92 mg/dL (ref 70–99)
POTASSIUM: 3.8 meq/L (ref 3.5–5.1)
Sodium: 139 mEq/L (ref 135–145)
TOTAL PROTEIN: 7.1 g/dL (ref 6.0–8.3)

## 2016-01-10 LAB — LIPID PANEL
CHOLESTEROL: 207 mg/dL — AB (ref 0–200)
HDL: 48 mg/dL (ref >39.00)
LDL Cholesterol: 133 mg/dL — ABNORMAL HIGH (ref 0–99)
NONHDL: 158.73
Total CHOL/HDL Ratio: 4
Triglycerides: 131 mg/dL (ref 0.0–149.0)
VLDL: 26.2 mg/dL (ref 0.0–40.0)

## 2016-01-10 NOTE — Assessment & Plan Note (Signed)
Labs today

## 2016-01-10 NOTE — Therapy (Signed)
[place] MAIN [facility] SERVICES [place], [place], [address] Phone: [phone]   Fax:  [phone]  Occupational Therapy Treatment  Patient Details  Name: Crystal Bass MRN: [id number] Date of Birth: 1941-06-09 Referring Provider: Tat  Encounter Date: 01/09/2016      OT End of Session - 01/09/16 1627    Visit Number 2   Date for OT Re-Evaluation 02/26/2016   Authorization Type G codes 2   OT Start Time 1301   OT Stop Time 1400   OT Time Calculation (min) 59 min   Activity Tolerance Patient tolerated treatment well   Behavior During Therapy [facility] for tasks assessed/performed      Past Medical History  Diagnosis Date  . Pancreatitis     secondary to Cholelithiasis  . Breast cancer ([place]) 2002    infiltrative ductal carcinoma   . Lesion of breast 1992    right, benign  . Post-thoracotomy pain syndrome   . Uterine fibroid   . Endometriosis   . Ovarian cancer ([place]) 2004  . Aneurysm of aorta ([facility])     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  . Osteoporosis, post-menopausal 03/14/2012  . Ductal carcinoma of breast, estrogen receptor positive, stage 1 ([place]) 09/16/2012  . Pseudomonas pneumonia ([place]) 5/09  . COPD (chronic obstructive pulmonary disease) ([place]) 9/08  . Mastalgia 7/95  . Lung disease     secondary to [name] infection  . Hypercalcemia 09/14/2013  . Vitamin D deficiency   . Aortic root aneurysm ([place])   . Parkinson disease ([place]) 02/10/15    Past Surgical History  Procedure Laterality Date  . Lung biopsy  2002    [name], Dr [name]  . Cardiac catheterization  2007    essentially negation for significant CAD  . Appendectomy  2004  . Cholecystectomy  2001  . 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 Breast lumpectomy  1992    benign  . Mastectomy modified radical  2002    oral chemotheraphy (tamoxifen then Armidex) no radiation, Dr.[name]  . Tonsillectomy  1946  .  Breast biopsy  08/2004    right breast-benign  . Colonoscopy  2014    Dr [name] , due 2019    There were no vitals filed for this visit.  Visit Diagnosis:  Parkinson disease ([place])  Difficulty walking  Self-care deficit for hygiene  Muscle weakness (generalized)  Other lack of coordination      Subjective Assessment - 01/09/16 1626    Subjective  Patient reports she has felt short of breath this morning after performing exercises at home.  She attributes this to her COPD.     Patient Stated Goals Patient reports her goal is to get back to her previous baseline if possible, be independent, improve her grip, transfers, walking, and strength.     Currently in Pain? No/denies   Pain Score 0-No pain                      OT Treatments/Exercises (OP) - 01/10/16 0001    Neurological Re-education Exercises   Other Exercises 1 Patient seen for initial instruction of LSVT BIG exercises: LSVT Daily Session Maximal Daily Exercises: Sustained movements are designed to rescale the amplitude of movement output for generalization to daily functional activities. Performed as follows for 1 set of 10 repetitions each: Multi directional sustained movements- 1) Floor to ceiling,  2) Side to side. Multi directional Repetitive movements performed in standing and are designed to provide retraining effort needed for sustained muscle activation in tasks Performed as follows: 3) Step and reach forward, 4) Step and Reach Backwards, 5) Step and reach sideways, 6) Rock and reach forward/backward, 7) Rock and reach sideways. Sit to stand from mat table on lowest setting with cues for weight shift, technique and CGA for 5 reps for 2 sets. Patient seen for functional mobility tasks this date with emphasis on gait speed, length of steps with short distance ambulation. Patient requires cues for BIG movements and cadence.All standing exercises performed this date with CGA, verbal and tactile cues as needed.    Other Exercises 2 Patient seen for functional mobility tasks for 500 feet with cues for BIG movements, reciprocal arm swing.  Patient performing steps 5 up and down with use of one hand rail with cues and SBA.  Reciprocal toe tapping with cues and CGA to SBA.                  OT Education - 01/09/16 1627    Education provided Yes   Education Details LSVT BIG maximal daily exercises, functional component tasks.    Person(s) Educated Patient   Methods Explanation;Demonstration;Verbal cues   Comprehension Verbal cues required;Returned demonstration;Verbalized understanding             OT Long Term Goals - 01/04/16 1443    OT LONG TERM GOAL #1   Title Patient will improve gait speed and endurance and be able to walk 1600 feet in 6 minutes to negotiate around the home and community safely in 4 weeks.   Baseline 1430 feet at evaluation   Time 4   Period Weeks   Status New   OT LONG TERM GOAL #2   Title Patient will complete HEP for maximal daily exercises with modified independence in 4 weeks   Baseline has not been consistent with HEP for the last 2 months and needs instruction   Time 4   Period Weeks   Status New   OT LONG TERM GOAL #3   Title Patient will transfer from sit to stand without the use of arms safely and independently from a variety of chairs/surfaces in 4 weeks.    Baseline difficulty from lower surfaces, 5 times sit to stand at eval 10 secs   Time 4   Period Weeks   Status New   OT LONG TERM GOAL #4   Title Patient will demonstrate 100% legibility with handwriting for name and address with use of normal sized letters   Baseline micrographia noted on eval   Time 4   Period Weeks   Status New   OT LONG TERM GOAL #5   Title Patient will complete hair care with modified independence, demonstrating reaching to top of head for 1-2 minutes at a time to complete task.   Baseline unable   Time 4   Period Weeks   Status New               Plan  - 01/09/16 1627    Clinical Impression Statement Patient is familiar with LSVT BIG maximal daily exercises from the past but did require cues for correct performance, hand motions and amplitude of movements.  She gets SOB easily with exertion and therapist monitored her O2 sats throughout session and with all tasks.  Her o2 ranged from 91 to 98% during exercises but fell  to 84% after stair  negotiation and required rest break and cues for deep breathing and it went back up to 94% within 30 secs.  Will continue to work towards finalizing functional component tasks over the next session.     Pt will benefit from skilled therapeutic intervention in order to improve on the following deficits (Retired) Decreased endurance;Decreased strength;Impaired flexibility;Decreased balance;Decreased mobility;Difficulty walking;Decreased cognition;Pain;Decreased coordination;Impaired UE functional use   Rehab Potential Good   OT Frequency 4x / week   OT Duration 4 weeks   OT Treatment/Interventions Self-care/ADL training;Therapeutic exercise;Functional Mobility Training;Patient/family education;Neuromuscular education;Balance training;Therapeutic exercises;DME and/or AE instruction;Therapeutic activities;Gait Training;Stair Training   Consulted and Agree with Plan of Care Patient        Problem List Patient Active Problem List   Diagnosis Date Noted  . Parkinson disease ([place]) 01/10/2016  . DNR (do not resuscitate) 05/22/2015  . Tremor 02/07/2015  . Dysphagia, pharyngoesophageal phase 02/07/2015  . Hypercalcemia 09/14/2013  . Unspecified vitamin D deficiency 04/27/2013  . Breast cancer, left breast ([place]) 09/16/2012  . Osteoporosis, post-menopausal 03/14/2012  . AORTIC ANEURYSM 01/01/2011  . OSTEOARTHRITIS, CERVICAL SPINE 08/15/2009  . HYPERGLYCEMIA, FASTING 12/06/2008  . ALLERGIC RHINITIS 03/25/2008  . Chronic obstructive airway disease with asthma ([place]) 03/25/2008  . BREAST CANCER, HX OF 03/10/2008  .  Mixed hyperlipidemia 02/10/2007  . Essential hypertension 02/10/2007   Amy T Tomasita Morrow, OTR/L, CLT Lovett,Amy 01/10/2016, 5:07 PM  [place] MAIN [facility] SERVICES [address] [place], [place], [address] Phone: [phone]   Fax:  [phone]  Name: Crystal Bass MRN: [id number] Date of Birth: 1940/12/08

## 2016-01-10 NOTE — Assessment & Plan Note (Signed)
Discussed this with pt today- she would like to remain DNR.

## 2016-01-10 NOTE — Patient Instructions (Signed)
It was great to meet you. We will call you with your lab results.

## 2016-01-10 NOTE — Assessment & Plan Note (Signed)
No rxs per pt preference.

## 2016-01-10 NOTE — Assessment & Plan Note (Signed)
Well controlled on current rxs. No changes made today. 

## 2016-01-10 NOTE — Progress Notes (Signed)
Subjective:   Patient ID: Crystal Bass, female    DOB: May 30, 1941, 75 y.o.   MRN: [id number]  Crystal Bass is a pleasant 75 y.o. year old female who presents to clinic today with [place]  on 01/10/2016  HPI:  Here to establish care from Dr. Linna Darner.  Parkinson's disease- sees Dr. Carles Collet.  Was last seen last month- 11/29/15- note reviewed. Referred to physical therapy which she has been attending- most recently yesterday, 01/09/16. She was having some side effects from carbidopa/levodopa, so they have been working on adjusting this dose.  Selegiline added on 12/12/15 but she never started this.  She does have a long history of asthma/COPD but does not use inhalers regularly because it worsens her tremor.  She does have a rescue inhaler that she will use "if she gets into trouble."  She is a DNR and would like to remain one- has yellow sheet on her refrigerator at home.  HTN- has been taking Cozaar 50 mg daily and lopressor 25 mg twice daily for years.  Osteoporosis- receiving infusions twice a year at the cancer center for at least 8 years. Lab Results  Component Value Date   CREATININE 0.8 09/27/2015   Breast cancer- 2002- left mastectomy and arimidex. Mammogram 02/25/15, has the next one scheduled.   Current Outpatient Prescriptions on File Prior to Visit  Medication Sig Dispense Refill  . acetaminophen (TYLENOL) 325 MG tablet Take 1,000 mg by mouth 2 (two) times daily as needed.     [name] aspirin 81 MG tablet Take 81 mg by mouth daily.      . carbidopa-levodopa ([name]) 25-100 MG tablet 2 tablets in the morning, 1 tablet in the afternoon, 1 in the evening (Patient taking differently: 1 tablets in the morning, 1 tablet in the afternoon, 1 in the evening) 360 tablet 1  . clonazePAM (KLONOPIN) 0.5 MG tablet Take 0.5-1 tablets (0.25-0.5 mg total) by mouth at bedtime. Take 1/2 tablet nightly for dreams 90 tablet 1  . gabapentin ([name]) 300 MG capsule TAKE 1-3 CAPSULES  (300-900 MG TOTAL) BY MOUTH 3 (THREE) TIMES DAILY. (Patient taking differently: 1 tablet in the morning, 2 in the evening) 270 capsule 3  . losartan (COZAAR) 50 MG tablet TAKE 1 TABLET EVERY DAY 90 tablet 3  . metoprolol tartrate (LOPRESSOR) 25 MG tablet 1 by mouth twice a day 180 tablet 3  . Vitamin D, Ergocalciferol, ([name]) 50000 UNITS CAPS capsule TAKE 1 CAPSULE BY MOUTH EVERY WEEK 26 capsule 1  . zolendronic acid (ZOMETA) 4 MG/5ML injection Inject 5 mLs (4 mg total) into the vein every 6 (six) months. 5 mL 0  . selegiline (ELDEPRYL) 5 MG tablet Take 1 tablet (5 mg total) by mouth 2 (two) times daily with a meal. (Patient not taking: Reported on 01/03/2016) 60 tablet 0   No current facility-administered medications on file prior to visit.    Allergies  Allergen Reactions  . Sulfonamide Derivatives Anaphylaxis  . Topamax [Topiramate] Other (See Comments)    Metabolic acidosis   . Biaxin [Clarithromycin] Other (See Comments)    pericarditis  . Hydrocodone Other (See Comments)    "hyper and climbing the walls"  . Motrin [Ibuprofen] Other (See Comments)    headaches  . Symbicort [Budesonide-Formoterol Fumarate]     02/07/15 tremor  . Percocet [Oxycodone-Acetaminophen] Itching and Rash  . Tape Itching and Rash    Use paper tape only  . Tetracyclines & Related Other (See Comments)    "  immediate yeast infection"    Past Medical History  Diagnosis Date  . Pancreatitis     secondary to Cholelithiasis  . Breast cancer ([place]) 2002    infiltrative ductal carcinoma   . Lesion of breast 1992    right, benign  . Post-thoracotomy pain syndrome   . Uterine fibroid   . Endometriosis   . Ovarian cancer ([place]) 2004  . Aneurysm of aorta (H[facility])     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  . Osteoporosis, post-menopausal 03/14/2012  . Ductal carcinoma of breast, estrogen receptor positive, stage 1 ([place]) 09/16/2012  . Pseudomonas pneumonia ([place]) 5/09  . COPD (chronic obstructive pulmonary disease)  ([place]) 9/08  . Mastalgia 7/95  . Lung disease     secondary to [name] infection  . Hypercalcemia 09/14/2013  . Vitamin D deficiency   . Aortic root aneurysm ([place])   . Parkinson disease ([place]) 02/10/15    Past Surgical History  Procedure Laterality Date  . Lung biopsy  2002    [name], Dr [name]  . Cardiac catheterization  2007    essentially negation for significant CAD  . Appendectomy  2004  . Cholecystectomy  2001  . 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 Breast lumpectomy  1992    benign  . Mastectomy modified radical  2002    oral chemotheraphy (tamoxifen then Armidex) no radiation, Dr.[name]  . Tonsillectomy  1946  . Breast biopsy  08/2004    right breast-benign  . Colonoscopy  2014    Dr [name] , due 2019    Family History  Problem Relation Age of Onset  . Lung cancer Father   . Breast cancer Sister 69  . Colon cancer Sister   . Stroke Paternal Aunt     in mid 17s  . Osteoporosis Paternal Aunt   . Myasthenia gravis Paternal Aunt   . Emphysema Mother     was never a smoker  . Diabetes Neg Hx   . Heart disease Neg Hx     Social History   Social History  . Marital Status: Widowed    Spouse Name: N/A  . Number of Children: 0  . Years of Education: N/A   Occupational History  . 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 Smoking status: Never Smoker   . Smokeless tobacco: Never Used  . Alcohol Use: No  . Drug Use: No  . Sexual Activity: No     Comment: TAH/BSO   Other Topics Concern  . Not on file   Social History Narrative   Caregiver for >45 yo mother in law living with pt   The PMH, PSH, Social History, Family History, Medications, and allergies have been reviewed in [facility], and have been updated if relevant.   Review of Systems  Constitutional: Positive for fatigue. Negative for unexpected weight change.  HENT: Negative.   Eyes: Negative.     Respiratory: Negative.   Cardiovascular: Negative.   Gastrointestinal: Negative.   Endocrine: Negative.   Genitourinary: Negative.   Musculoskeletal: Negative.   Skin: Negative.   Allergic/Immunologic: Negative.   Neurological: Positive for tremors.  Hematological: Negative.   Psychiatric/Behavioral: Negative.   All other systems reviewed and are negative.      Objective:    BP 128/80 mmHg  Pulse 62  Temp(Src) 97.2 F (36.2 C) (Oral)  Wt 156 lb  12 oz (71.101 kg)  Wt Readings from Last 3 Encounters:  01/10/16 156 lb 12 oz (71.101 kg)  11/29/15 157 lb (71.215 kg)  08/23/15 154 lb (69.854 kg)    Physical Exam  Constitutional: She is oriented to person, place, and time. She appears well-developed and well-nourished. No distress.  HENT:  Head: Normocephalic and atraumatic.  Eyes: Conjunctivae are normal.  Neck: Normal range of motion.  Cardiovascular: Normal rate and regular rhythm.   Pulmonary/Chest: Effort normal and breath sounds normal.  Musculoskeletal: Normal range of motion. She exhibits no edema.  kyphosis  Neurological: She is alert and oriented to person, place, and time. No cranial nerve deficit.  Tremor noted  Skin: Skin is warm and dry. She is not diaphoretic.  Psychiatric: She has a normal mood and affect. Her behavior is normal. Judgment and thought content normal.  Nursing note and vitals reviewed.         Assessment & Plan:   Chronic obstructive airway disease with asthma ([place])  Malignant neoplasm of left female breast, unspecified site of breast ([place])  Essential hypertension  Mixed hyperlipidemia  BREAST CANCER, HX OF  Parkinson disease ([place]) No Follow-up on file.

## 2016-01-10 NOTE — Assessment & Plan Note (Signed)
Receiving zometa through cancer center. Due for DEXA- I did order this today.

## 2016-01-10 NOTE — Progress Notes (Signed)
Pre visit review using our clinic review tool, if applicable. No additional management support is needed unless otherwise documented below in the visit note. 

## 2016-01-10 NOTE — Assessment & Plan Note (Signed)
Followed by neurology. She does feel sinemet is helping.

## 2016-01-11 ENCOUNTER — Ambulatory Visit: Payer: Medicare Other | Admitting: Occupational Therapy

## 2016-01-11 DIAGNOSIS — R262 Difficulty in walking, not elsewhere classified: Secondary | ICD-10-CM

## 2016-01-11 DIAGNOSIS — M6281 Muscle weakness (generalized): Secondary | ICD-10-CM

## 2016-01-11 DIAGNOSIS — R460 Very low level of personal hygiene: Secondary | ICD-10-CM

## 2016-01-11 DIAGNOSIS — R278 Other lack of coordination: Secondary | ICD-10-CM

## 2016-01-11 DIAGNOSIS — Z741 Need for assistance with personal care: Secondary | ICD-10-CM

## 2016-01-11 DIAGNOSIS — G20 Parkinson's disease: Secondary | ICD-10-CM | POA: Diagnosis not present

## 2016-01-11 DIAGNOSIS — G20A1 Parkinson's disease without dyskinesia, without mention of fluctuations: Secondary | ICD-10-CM

## 2016-01-12 ENCOUNTER — Ambulatory Visit: Payer: Medicare Other | Admitting: Occupational Therapy

## 2016-01-12 ENCOUNTER — Encounter: Payer: Self-pay | Admitting: *Deleted

## 2016-01-12 DIAGNOSIS — G20 Parkinson's disease: Secondary | ICD-10-CM

## 2016-01-12 DIAGNOSIS — R262 Difficulty in walking, not elsewhere classified: Secondary | ICD-10-CM

## 2016-01-12 DIAGNOSIS — R278 Other lack of coordination: Secondary | ICD-10-CM

## 2016-01-12 DIAGNOSIS — R460 Very low level of personal hygiene: Secondary | ICD-10-CM

## 2016-01-12 DIAGNOSIS — Z741 Need for assistance with personal care: Secondary | ICD-10-CM

## 2016-01-12 DIAGNOSIS — M6281 Muscle weakness (generalized): Secondary | ICD-10-CM

## 2016-01-13 ENCOUNTER — Ambulatory Visit: Payer: Medicare Other | Admitting: Occupational Therapy

## 2016-01-13 DIAGNOSIS — R278 Other lack of coordination: Secondary | ICD-10-CM

## 2016-01-13 DIAGNOSIS — R262 Difficulty in walking, not elsewhere classified: Secondary | ICD-10-CM

## 2016-01-13 DIAGNOSIS — R460 Very low level of personal hygiene: Secondary | ICD-10-CM

## 2016-01-13 DIAGNOSIS — Z741 Need for assistance with personal care: Secondary | ICD-10-CM

## 2016-01-13 DIAGNOSIS — M6281 Muscle weakness (generalized): Secondary | ICD-10-CM

## 2016-01-13 DIAGNOSIS — G20 Parkinson's disease: Secondary | ICD-10-CM | POA: Diagnosis not present

## 2016-01-13 NOTE — Therapy (Signed)
[place] MAIN [facility] SERVICES [address] [place], [place], [address] Phone: [phone]   Fax:  [phone]  Occupational Therapy Treatment  Patient Details  Name: Crystal Bass MRN: [id number] Date of Birth: 05-Sep-1941 Referring Provider: Tat  Encounter Date: 01/11/2016      OT End of Session - 01/12/16 1609    Visit Number 3   Number of Visits 17   Date for OT Re-Evaluation 02-28-16   Authorization Type G codes 3   OT Start Time 1302   OT Stop Time 1400   OT Time Calculation (min) 58 min   Activity Tolerance Patient tolerated treatment well   Behavior During Therapy [facility] for tasks assessed/performed      Past Medical History  Diagnosis Date  . Pancreatitis     secondary to Cholelithiasis  . Breast cancer ([place]) 2002    infiltrative ductal carcinoma   . Lesion of breast 1992    right, benign  . Post-thoracotomy pain syndrome   . Uterine fibroid   . Endometriosis   . Ovarian cancer ([place]) 2004  . Aneurysm of aorta (HCC)     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  . Osteoporosis, post-menopausal 03/14/2012  . Ductal carcinoma of breast, estrogen receptor positive, stage 1 ([place]) 09/16/2012  . Pseudomonas pneumonia ([place]) 5/09  . COPD (chronic obstructive pulmonary disease) ([place]) 9/08  . Mastalgia 7/95  . Lung disease     secondary to [name] infection  . Hypercalcemia 09/14/2013  . Vitamin D deficiency   . Aortic root aneurysm ([place])   . Parkinson disease ([place]) 02/10/15    Past Surgical History  Procedure Laterality Date  . Lung biopsy  2002    [name], Dr [name]  . Cardiac catheterization  2007    essentially negation for significant CAD  . Appendectomy  2004  . Cholecystectomy  2001  . 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 Breast lumpectomy  1992    benign  . Mastectomy modified radical  2002    oral chemotheraphy (tamoxifen then Armidex) no radiation, Dr.[name]  .  Tonsillectomy  1946  . Breast biopsy  08/2004    right breast-benign  . Colonoscopy  2014    Dr [name] , due 2019    There were no vitals filed for this visit.  Visit Diagnosis:  Parkinson disease ([place])  Difficulty walking  Self-care deficit for hygiene  Muscle weakness (generalized)  Other lack of coordination      Subjective Assessment - 01/12/16 1555    Subjective  Patient reports she has a funeral to go to tomorrow and wants to change her time if possible to the morning.  Doesn't want to miss therapy.   Patient Stated Goals Patient reports her goal is to get back to her previous baseline if possible, be independent, improve her grip, transfers, walking, and strength.     Currently in Pain? No/denies   Pain Score 0-No pain   Multiple Pain Sites No                      OT Treatments/Exercises (OP) - 01/12/16 1557    ADLs   ADL Comments Functional component tasks this date performed as follows:  sit to stand, crossing legs to put on socks, hands reaching to the back of head for 15 secs each for hair care, stair negotiation, opening of containers.  Neurological Re-education Exercises   Other Exercises 1 Patient seen for initial instruction of LSVT BIG exercises: LSVT Daily Session Maximal Daily Exercises: Sustained movements are designed to rescale the amplitude of movement output for generalization to daily functional activities. Performed as follows for 1 set of 10 repetitions each: Multi directional sustained movements- 1) Floor to ceiling, 2) Side to side. Multi directional Repetitive movements performed in standing and are designed to provide retraining effort needed for sustained muscle activation in tasks Performed as follows: 3) Step and reach forward, 4) Step and Reach Backwards, 5) Step and reach sideways, 6) Rock and reach forward/backward, 7) Rock and reach sideways. Sit to stand from mat table on lowest setting with cues for weight shift, technique and  CGA for 5 reps for 2 sets. Patient seen for functional mobility tasks this date with emphasis on gait speed, length of steps with short distance ambulation. Patient requires cues for BIG movements and cadence.All standing exercises performed this date with CGA, verbal and tactile cues as needed.   Other Exercises 2 Patient seen for functional mobility tasks for 550 feet with cues for BIG movements, reciprocal arm swing. Patient performing steps 5 up and down with use of one hand rail with cues and SBA. Reciprocal toe tapping with cues and CGA to SBA.                 OT Education - 01/13/16 1608    Education provided Yes   Education Details LSVT BIG daily exercises, reciprocal arm swing.   Person(s) Educated Patient   Methods Explanation;Demonstration;Verbal cues   Comprehension Verbal cues required;Returned demonstration;Verbalized understanding             OT Long Term Goals - 01/04/16 1443    OT LONG TERM GOAL #1   Title Patient will improve gait speed and endurance and be able to walk 1600 feet in 6 minutes to negotiate around the home and community safely in 4 weeks.   Baseline 1430 feet at evaluation   Time 4   Period Weeks   Status New   OT LONG TERM GOAL #2   Title Patient will complete HEP for maximal daily exercises with modified independence in 4 weeks   Baseline has not been consistent with HEP for the last 2 months and needs instruction   Time 4   Period Weeks   Status New   OT LONG TERM GOAL #3   Title Patient will transfer from sit to stand without the use of arms safely and independently from a variety of chairs/surfaces in 4 weeks.    Baseline difficulty from lower surfaces, 5 times sit to stand at eval 10 secs   Time 4   Period Weeks   Status New   OT LONG TERM GOAL #4   Title Patient will demonstrate 100% legibility with handwriting for name and address with use of normal sized letters   Baseline micrographia noted on eval   Time 4   Period  Weeks   Status New   OT LONG TERM GOAL #5   Title Patient will complete hair care with modified independence, demonstrating reaching to top of head for 1-2 minutes at a time to complete task.   Baseline unable   Time 4   Period Weeks   Status New               Plan - 01/13/16 1609    Clinical Impression Statement Patient short of breath at times but her  O2 sats stayed above 90% this date with exercises, short rest breaks as needed with encouragement for deep breathing.  Cues for reciprocal arm swing with functional mobility this date.  Minimal cues for exercises for form.     Pt will benefit from skilled therapeutic intervention in order to improve on the following deficits (Retired) Decreased endurance;Decreased strength;Impaired flexibility;Decreased balance;Decreased mobility;Difficulty walking;Decreased cognition;Pain;Decreased coordination;Impaired UE functional use   Rehab Potential Good   OT Frequency 4x / week   OT Duration 4 weeks   OT Treatment/Interventions Self-care/ADL training;Therapeutic exercise;Functional Mobility Training;Patient/family education;Neuromuscular education;Balance training;Therapeutic exercises;DME and/or AE instruction;Therapeutic activities;Gait Training;Stair Training   Consulted and Agree with Plan of Care Patient        Problem List Patient Active Problem List   Diagnosis Date Noted  . Parkinson disease ([place]) 01/10/2016  . DNR (do not resuscitate) 05/22/2015  . Tremor 02/07/2015  . Dysphagia, pharyngoesophageal phase 02/07/2015  . Hypercalcemia 09/14/2013  . Unspecified vitamin D deficiency 04/27/2013  . Breast cancer, left breast ([place]) 09/16/2012  . Osteoporosis, post-menopausal 03/14/2012  . AORTIC ANEURYSM 01/01/2011  . OSTEOARTHRITIS, CERVICAL SPINE 08/15/2009  . HYPERGLYCEMIA, FASTING 12/06/2008  . ALLERGIC RHINITIS 03/25/2008  . Chronic obstructive airway disease with asthma ([place]) 03/25/2008  . BREAST CANCER, HX OF 03/10/2008   . Mixed hyperlipidemia 02/10/2007  . Essential hypertension 02/10/2007   Tarick Parenteau T Tomasita Morrow, OTR/L, CLT Maebell Lyvers 01/13/2016, 4:12 PM  [place] MAIN [facility] SERVICES [place], [place], [address] Phone: [phone]   Fax:  [phone]  Name: Crystal Bass MRN: [id number] Date of Birth: [birth year]

## 2016-01-13 NOTE — Therapy (Signed)
[place] MAIN [facility] SERVICES [address] [place], [place], [address] Phone: [phone]   Fax:  [phone]  Occupational Therapy Treatment  Patient Details  Name: Crystal Bass MRN: [id number] Date of Birth: 1941-06-02 Referring Provider: Tat  Encounter Date: 01/12/2016      OT End of Session - 01/12/16 1616    Visit Number 4   Number of Visits 17   Date for OT Re-Evaluation 02-15-2016   Authorization Type G codes 4   OT Start Time 0830   OT Stop Time 0929   OT Time Calculation (min) 59 min   Activity Tolerance Patient tolerated treatment well   Behavior During Therapy [facility] for tasks assessed/performed      Past Medical History  Diagnosis Date  . Pancreatitis     secondary to Cholelithiasis  . Breast cancer ([place]) 2002    infiltrative ductal carcinoma   . Lesion of breast 1992    right, benign  . Post-thoracotomy pain syndrome   . Uterine fibroid   . Endometriosis   . Ovarian cancer ([place]) 2004  . Aneurysm of aorta (HCC)     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  . Osteoporosis, post-menopausal 03/14/2012  . Ductal carcinoma of breast, estrogen receptor positive, stage 1 ([place]) 09/16/2012  . Pseudomonas pneumonia ([place]) 5/09  . COPD (chronic obstructive pulmonary disease) ([place]) 9/08  . Mastalgia 7/95  . Lung disease     secondary to [name] infection  . Hypercalcemia 09/14/2013  . Vitamin D deficiency   . Aortic root aneurysm ([place])   . Parkinson disease ([place]) 02/10/15    Past Surgical History  Procedure Laterality Date  . Lung biopsy  2002    [name], Dr [name]  . Cardiac catheterization  2007    essentially negation for significant CAD  . Appendectomy  2004  . Cholecystectomy  2001  . 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 Breast lumpectomy  1992    benign  . Mastectomy modified radical  2002    oral chemotheraphy (tamoxifen then Armidex) no radiation, Dr.[name]  .  Tonsillectomy  1946  . Breast biopsy  08/2004    right breast-benign  . Colonoscopy  2014    Dr [name] , due 2019    There were no vitals filed for this visit.  Visit Diagnosis:  Parkinson disease ([place])  Difficulty walking  Self-care deficit for hygiene  Muscle weakness (generalized)  Other lack of coordination      Subjective Assessment - 01/13/16 1613    Subjective  Patient reports it is hard to get started in the morning but glad her appointment was early since she has her annual physical test at [facility] at 1100 and then has to pick up her sister to go to a funeral.     Patient Stated Goals Patient reports her goal is to get back to her previous baseline if possible, be independent, improve her grip, transfers, walking, and strength.     Currently in Pain? No/denies   Pain Score 0-No pain                      OT Treatments/Exercises (OP) - 01/12/16 1614    ADLs   ADL Comments Functional component tasks this date performed as follows: sit to stand, crossing legs to put on socks, hands reaching to the back of head for 15 secs  each for hair care, stair negotiation, opening of containers.    Neurological Re-education Exercises   Other Exercises 1 Patient seen for initial instruction of LSVT BIG exercises: LSVT Daily Session Maximal Daily Exercises: Sustained movements are designed to rescale the amplitude of movement output for generalization to daily functional activities. Performed as follows for 1 set of 10 repetitions each: Multi directional sustained movements- 1) Floor to ceiling, 2) Side to side. Multi directional Repetitive movements performed in standing and are designed to provide retraining effort needed for sustained muscle activation in tasks Performed as follows: 3) Step and reach forward, 4) Step and Reach Backwards, 5) Step and reach sideways, 6) Rock and reach forward/backward, 7) Rock and reach sideways. Sit to stand from mat table on lowest setting  with cues for weight shift, technique and CGA for 5 reps for 2 sets. Patient seen for functional mobility tasks this date with emphasis on gait speed, length of steps with short distance ambulation. Patient requires cues for BIG movements and cadence.All standing exercises performed this date with CGA, verbal and tactile cues as needed.   Other Exercises 2 Patient seen for functional mobility tasks for 400 feet with cues for BIG movements, reciprocal arm swing. Patient performing steps 5 up and down with use of one hand rail with cues and SBA. Reciprocal toe tapping with cues and CGA to SBA.                 OT Education - 01/13/16 1616    Education provided Yes   Education Details LSVT BIG maximal daily exercises, big walking, principles of LSVT BIG   Person(s) Educated Patient   Methods Explanation;Demonstration;Verbal cues   Comprehension Verbal cues required;Returned demonstration;Verbalized understanding             OT Long Term Goals - 01/04/16 1443    OT LONG TERM GOAL #1   Title Patient will improve gait speed and endurance and be able to walk 1600 feet in 6 minutes to negotiate around the home and community safely in 4 weeks.   Baseline 1430 feet at evaluation   Time 4   Period Weeks   Status New   OT LONG TERM GOAL #2   Title Patient will complete HEP for maximal daily exercises with modified independence in 4 weeks   Baseline has not been consistent with HEP for the last 2 months and needs instruction   Time 4   Period Weeks   Status New   OT LONG TERM GOAL #3   Title Patient will transfer from sit to stand without the use of arms safely and independently from a variety of chairs/surfaces in 4 weeks.    Baseline difficulty from lower surfaces, 5 times sit to stand at eval 10 secs   Time 4   Period Weeks   Status New   OT LONG TERM GOAL #4   Title Patient will demonstrate 100% legibility with handwriting for name and address with use of normal sized  letters   Baseline micrographia noted on eval   Time 4   Period Weeks   Status New   OT LONG TERM GOAL #5   Title Patient will complete hair care with modified independence, demonstrating reaching to top of head for 1-2 minutes at a time to complete task.   Baseline unable   Time 4   Period Weeks   Status New               Plan -  01/13/16 1609    Clinical Impression Statement Patient short of breath at times but her O2 sats stayed above 90% this date with exercises, short rest breaks as needed with encouragement for deep breathing.  Cues for reciprocal arm swing with functional mobility this date.  Minimal cues for exercises for form.     Pt will benefit from skilled therapeutic intervention in order to improve on the following deficits (Retired) Decreased endurance;Decreased strength;Impaired flexibility;Decreased balance;Decreased mobility;Difficulty walking;Decreased cognition;Pain;Decreased coordination;Impaired UE functional use   Rehab Potential Good   OT Frequency 4x / week   OT Duration 4 weeks   OT Treatment/Interventions Self-care/ADL training;Therapeutic exercise;Functional Mobility Training;Patient/family education;Neuromuscular education;Balance training;Therapeutic exercises;DME and/or AE instruction;Therapeutic activities;Gait Training;Stair Training   Consulted and Agree with Plan of Care Patient        Problem List Patient Active Problem List   Diagnosis Date Noted  . Parkinson disease ([place]) 01/10/2016  . DNR (do not resuscitate) 05/22/2015  . Tremor 02/07/2015  . Dysphagia, pharyngoesophageal phase 02/07/2015  . Hypercalcemia 09/14/2013  . Unspecified vitamin D deficiency 04/27/2013  . Breast cancer, left breast ([place]) 09/16/2012  . Osteoporosis, post-menopausal 03/14/2012  . AORTIC ANEURYSM 01/01/2011  . OSTEOARTHRITIS, CERVICAL SPINE 08/15/2009  . HYPERGLYCEMIA, FASTING 12/06/2008  . ALLERGIC RHINITIS 03/25/2008  . Chronic obstructive airway disease  with asthma ([place]) 03/25/2008  . BREAST CANCER, HX OF 03/10/2008  . Mixed hyperlipidemia 02/10/2007  . Essential hypertension 02/10/2007   Amy T Tomasita Morrow, OTR/L, CLT Lovett,Amy 01/13/2016, 4:21 PM  [place] MAIN [facility] SERVICES [address] [place], [place], [address] Phone: [phone]   Fax:  [phone]  Name: Crystal Bass MRN: [id number] Date of Birth: 04-25-1941

## 2016-01-14 ENCOUNTER — Other Ambulatory Visit: Payer: Self-pay | Admitting: Neurology

## 2016-01-14 ENCOUNTER — Encounter: Payer: Self-pay | Admitting: Occupational Therapy

## 2016-01-14 NOTE — Therapy (Signed)
[place] MAIN [facility] SERVICES [address] [place], [place], [address] Phone: [phone]   Fax:  [phone]  Occupational Therapy Treatment  Patient Details  Name: Crystal Bass MRN: [id number] Date of Birth: 10-Dec-1940 Referring Provider: Tat  Encounter Date: 01/13/2016      OT End of Session - 01/14/16 1158    Visit Number 5   Number of Visits 17   Date for OT Re-Evaluation 2016-03-01   Authorization Type G codes 5   OT Start Time 1052   OT Stop Time 1155   OT Time Calculation (min) 63 min   Activity Tolerance Patient tolerated treatment well   Behavior During Therapy [facility] for tasks assessed/performed      Past Medical History  Diagnosis Date  . Pancreatitis     secondary to Cholelithiasis  . Breast cancer ([place]) 2002    infiltrative ductal carcinoma   . Lesion of breast 1992    right, benign  . Post-thoracotomy pain syndrome   . Uterine fibroid   . Endometriosis   . Ovarian cancer ([place]) 2004  . Aneurysm of aorta ([facility])     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  . Osteoporosis, post-menopausal 03/14/2012  . Ductal carcinoma of breast, estrogen receptor positive, stage 1 ([place]) 09/16/2012  . Pseudomonas pneumonia ([place]) 5/09  . COPD (chronic obstructive pulmonary disease) ([place]) 9/08  . Mastalgia 7/95  . Lung disease     secondary to [name] infection  . Hypercalcemia 09/14/2013  . Vitamin D deficiency   . Aortic root aneurysm ([place])   . Parkinson disease ([place]) 02/10/15    Past Surgical History  Procedure Laterality Date  . Lung biopsy  2002    [name], Dr [name]  . Cardiac catheterization  2007    essentially negation for significant CAD  . Appendectomy  2004  . Cholecystectomy  2001  . 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 Breast lumpectomy  1992    benign  . Mastectomy modified radical  2002    oral chemotheraphy (tamoxifen then Armidex) no radiation, Dr.[name]  .  Tonsillectomy  1946  . Breast biopsy  08/2004    right breast-benign  . Colonoscopy  2014    Dr [name] , due 2019    There were no vitals filed for this visit.  Visit Diagnosis:  Parkinson disease ([place])  Difficulty walking  Self-care deficit for hygiene  Muscle weakness (generalized)  Other lack of coordination      Subjective Assessment - 01/14/16 1155    Subjective  Patient reports her physical testing went well, better than she thought, she actually did better than she did a year ago, she is very pleased.  Has another funeral to go to this weekend of a neighbor growing up.    Patient Stated Goals Patient reports her goal is to get back to her previous baseline if possible, be independent, improve her grip, transfers, walking, and strength.     Currently in Pain? No/denies   Pain Score 0-No pain                      OT Treatments/Exercises (OP) - 01/14/16 1156    ADLs   ADL Comments Functional component tasks this date performed as follows: sit to stand, crossing legs to put on socks, hands reaching to the back of head for 15 secs each for hair care,  stair negotiation, opening of containers.    Neurological Re-education Exercises   Other Exercises 1 Patient seen for initial instruction of LSVT BIG exercises: LSVT Daily Session Maximal Daily Exercises: Sustained movements are designed to rescale the amplitude of movement output for generalization to daily functional activities. Performed as follows for 1 set of 10 repetitions each: Multi directional sustained movements- 1) Floor to ceiling, 2) Side to side. Multi directional Repetitive movements performed in standing and are designed to provide retraining effort needed for sustained muscle activation in tasks Performed as follows: 3) Step and reach forward, 4) Step and Reach Backwards, 5) Step and reach sideways, 6) Rock and reach forward/backward, 7) Rock and reach sideways. Sit to stand from mat table on lowest  setting with cues for weight shift, technique and CGA for 5 reps for 2 sets. Patient seen for functional mobility tasks this date with emphasis on gait speed, length of steps with short distance ambulation. Patient requires cues for BIG movements and cadence.All standing exercises performed this date with CGA, verbal and tactile cues as needed.   Other Exercises 2 Patient seen for functional mobility tasks for 600 feet with cues for BIG movements, reciprocal arm swing. Patient performing steps 5 up and down with use of one hand rail with cues and SBA. Reciprocal toe tapping with cues and CGA to SBA.                OT Education - 01/14/16 1157    Education provided Yes   Education Details LSVT BIG principles and concepts as it relates to function   Person(s) Educated Patient   Methods Explanation;Demonstration;Verbal cues   Comprehension Verbal cues required;Returned demonstration;Verbalized understanding             OT Long Term Goals - 01/04/16 1443    OT LONG TERM GOAL #1   Title Patient will improve gait speed and endurance and be able to walk 1600 feet in 6 minutes to negotiate around the home and community safely in 4 weeks.   Baseline 1430 feet at evaluation   Time 4   Period Weeks   Status New   OT LONG TERM GOAL #2   Title Patient will complete HEP for maximal daily exercises with modified independence in 4 weeks   Baseline has not been consistent with HEP for the last 2 months and needs instruction   Time 4   Period Weeks   Status New   OT LONG TERM GOAL #3   Title Patient will transfer from sit to stand without the use of arms safely and independently from a variety of chairs/surfaces in 4 weeks.    Baseline difficulty from lower surfaces, 5 times sit to stand at eval 10 secs   Time 4   Period Weeks   Status New   OT LONG TERM GOAL #4   Title Patient will demonstrate 100% legibility with handwriting for name and address with use of normal sized letters    Baseline micrographia noted on eval   Time 4   Period Weeks   Status New   OT LONG TERM GOAL #5   Title Patient will complete hair care with modified independence, demonstrating reaching to top of head for 1-2 minutes at a time to complete task.   Baseline unable   Time 4   Period Weeks   Status New               Plan - 01/14/16 1159    Clinical  Impression Statement Patient felt better today and was pleased with her physical evaluation at [facility] yesterday, felt encouraged that she did better than last year.  She was able to increase distance with date with utilizing BIG principles of increasing amplitude of gait, cues for reciprocal arm swing.  Rest breaks as needed.  Able to recall exercises without using handouts this date.    Pt will benefit from skilled therapeutic intervention in order to improve on the following deficits (Retired) Decreased endurance;Decreased strength;Impaired flexibility;Decreased balance;Decreased mobility;Difficulty walking;Decreased cognition;Pain;Decreased coordination;Impaired UE functional use   Rehab Potential Good   OT Frequency 4x / week   OT Duration 4 weeks   OT Treatment/Interventions Self-care/ADL training;Therapeutic exercise;Functional Mobility Training;Patient/family education;Neuromuscular education;Balance training;Therapeutic exercises;DME and/or AE instruction;Therapeutic activities;Gait Training;Stair Training   Consulted and Agree with Plan of Care Patient        Problem List Patient Active Problem List   Diagnosis Date Noted  . Parkinson disease ([place]) 01/10/2016  . DNR (do not resuscitate) 05/22/2015  . Tremor 02/07/2015  . Dysphagia, pharyngoesophageal phase 02/07/2015  . Hypercalcemia 09/14/2013  . Unspecified vitamin D deficiency 04/27/2013  . Breast cancer, left breast ([place]) 09/16/2012  . Osteoporosis, post-menopausal 03/14/2012  . AORTIC ANEURYSM 01/01/2011  . OSTEOARTHRITIS, CERVICAL SPINE 08/15/2009  .  HYPERGLYCEMIA, FASTING 12/06/2008  . ALLERGIC RHINITIS 03/25/2008  . Chronic obstructive airway disease with asthma ([place]) 03/25/2008  . BREAST CANCER, HX OF 03/10/2008  . Mixed hyperlipidemia 02/10/2007  . Essential hypertension 02/10/2007   Amy T Tomasita Morrow, OTR/L, CLT  Lovett,Amy 01/14/2016, 12:02 PM  [place] MAIN [facility] SERVICES [address] [place], [place], [address] Phone: [phone]   Fax:  [phone]  Name: Crystal Bass MRN: [id number] Date of Birth: 1941/08/13

## 2016-01-16 ENCOUNTER — Telehealth: Payer: Self-pay | Admitting: Family Medicine

## 2016-01-16 ENCOUNTER — Ambulatory Visit: Payer: Medicare Other | Admitting: Occupational Therapy

## 2016-01-16 DIAGNOSIS — R262 Difficulty in walking, not elsewhere classified: Secondary | ICD-10-CM

## 2016-01-16 DIAGNOSIS — R460 Very low level of personal hygiene: Secondary | ICD-10-CM

## 2016-01-16 DIAGNOSIS — M6281 Muscle weakness (generalized): Secondary | ICD-10-CM

## 2016-01-16 DIAGNOSIS — Z741 Need for assistance with personal care: Secondary | ICD-10-CM

## 2016-01-16 DIAGNOSIS — G20 Parkinson's disease: Secondary | ICD-10-CM

## 2016-01-16 NOTE — Telephone Encounter (Signed)
I called patient after we received refill request from her pharmacy for the selegiline 5 mg, to ask how she was doing on the medication. She was supposed to call the office back to let us know if the medication was helping. She stated that she had decided not to take the medication and try to improve her energy by exercise.

## 2016-01-16 NOTE — Telephone Encounter (Signed)
Correct

## 2016-01-16 NOTE — Telephone Encounter (Signed)
Ok.  Thx.  Then she obviously needs no refill, right?

## 2016-01-17 ENCOUNTER — Encounter: Payer: Self-pay | Admitting: Occupational Therapy

## 2016-01-17 ENCOUNTER — Ambulatory Visit: Payer: Medicare Other | Admitting: Occupational Therapy

## 2016-01-17 DIAGNOSIS — Z741 Need for assistance with personal care: Secondary | ICD-10-CM

## 2016-01-17 DIAGNOSIS — R460 Very low level of personal hygiene: Secondary | ICD-10-CM

## 2016-01-17 DIAGNOSIS — R262 Difficulty in walking, not elsewhere classified: Secondary | ICD-10-CM

## 2016-01-17 DIAGNOSIS — G20 Parkinson's disease: Secondary | ICD-10-CM

## 2016-01-17 DIAGNOSIS — G20A1 Parkinson's disease without dyskinesia, without mention of fluctuations: Secondary | ICD-10-CM

## 2016-01-17 DIAGNOSIS — M6281 Muscle weakness (generalized): Secondary | ICD-10-CM

## 2016-01-17 DIAGNOSIS — R278 Other lack of coordination: Secondary | ICD-10-CM

## 2016-01-17 NOTE — Therapy (Signed)
[place] MAIN [facility] SERVICES [address] [place], [place], [address] Phone: [phone]   Fax:  [phone]  Occupational Therapy Treatment  Patient Details  Name: Crystal Bass MRN: [id number] Date of Birth: 1941-03-12 Referring Provider: Tat  Encounter Date: 01/16/2016      OT End of Session - 01/17/16 1653    Visit Number 6   Number of Visits 17   Date for OT Re-Evaluation 02/19/16   Authorization Type G codes 6   OT Start Time 1301   OT Stop Time 1400   OT Time Calculation (min) 59 min   Activity Tolerance Patient tolerated treatment well   Behavior During Therapy [facility] for tasks assessed/performed      Past Medical History  Diagnosis Date  . Pancreatitis     secondary to Cholelithiasis  . Breast cancer ([place]) 2002    infiltrative ductal carcinoma   . Lesion of breast 1992    right, benign  . Post-thoracotomy pain syndrome   . Uterine fibroid   . Endometriosis   . Ovarian cancer ([place]) 2004  . Aneurysm of aorta ([facility])     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  . Osteoporosis, post-menopausal 03/14/2012  . Ductal carcinoma of breast, estrogen receptor positive, stage 1 ([place]) 09/16/2012  . Pseudomonas pneumonia ([place]) 5/09  . COPD (chronic obstructive pulmonary disease) ([place]) 9/08  . Mastalgia 7/95  . Lung disease     secondary to [name] infection  . Hypercalcemia 09/14/2013  . Vitamin D deficiency   . Aortic root aneurysm ([place])   . Parkinson disease ([place]) 02/10/15    Past Surgical History  Procedure Laterality Date  . Lung biopsy  2002    [name], Dr [name]  . Cardiac catheterization  2007    essentially negation for significant CAD  . Appendectomy  2004  . Cholecystectomy  2001  . 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 Breast lumpectomy  1992    benign  . Mastectomy modified radical  2002    oral chemotheraphy (tamoxifen then Armidex) no radiation, Dr.[name]  .  Tonsillectomy  1946  . Breast biopsy  08/2004    right breast-benign  . Colonoscopy  2014    Dr [name] , due 2019    There were no vitals filed for this visit.  Visit Diagnosis:  Parkinson disease ([place])  Difficulty walking  Self-care deficit for hygiene  Muscle weakness (generalized)      Subjective Assessment - 01/17/16 1649    Subjective  Patient reports she had an OK weekend, suffered from a migraine that lasted several days but it is not hurting now.  She was able to do some exercises but there was a day or so she could only perform one set versus 2.   Patient Stated Goals Patient reports her goal is to get back to her previous baseline if possible, be independent, improve her grip, transfers, walking, and strength.     Currently in Pain? No/denies   Pain Score 0-No pain   Multiple Pain Sites No                      OT Treatments/Exercises (OP) - 01/16/16 1651    ADLs   ADL Comments Functional component tasks this date performed as follows: sit to stand, crossing legs to put on socks, hands reaching to the back of head for 15  secs each for hair care, stair negotiation, opening of containers. Handwriting for name, address and phone number for multiple trials performed this date.    Neurological Re-education Exercises   Other Exercises 1 Patient seen for initial instruction of LSVT BIG exercises: LSVT Daily Session Maximal Daily Exercises: Sustained movements are designed to rescale the amplitude of movement output for generalization to daily functional activities. Performed as follows for 1 set of 10 repetitions each: Multi directional sustained movements- 1) Floor to ceiling, 2) Side to side. Multi directional Repetitive movements performed in standing and are designed to provide retraining effort needed for sustained muscle activation in tasks Performed as follows: 3) Step and reach forward, 4) Step and Reach Backwards, 5) Step and reach sideways, 6) Rock and reach  forward/backward, 7) Rock and reach sideways. Sit to stand from mat table on lowest setting with cues for weight shift, technique and CGA for 5 reps for 2 sets. Patient seen for functional mobility tasks this date with emphasis on gait speed, length of steps with short distance ambulation. Patient requires cues for BIG movements and cadence.All standing exercises performed this date with SBA, verbal and tactile cues as needed.   Other Exercises 2 Patient seen for functional mobility tasks for 700 feet with verbal cues for BIG movements, reciprocal arm swing. Patient performing steps 5 up and down with use of one hand rail with cues and SBA. Reciprocal toe tapping with occasional verbal cues and SBA.                OT Education - 01/17/16 1653    Education provided Yes   Education Details LSVT BIG   Person(s) Educated Patient   Methods Explanation;Demonstration;Verbal cues   Comprehension Verbal cues required;Returned demonstration;Verbalized understanding             OT Long Term Goals - 01/04/16 1443    OT LONG TERM GOAL #1   Title Patient will improve gait speed and endurance and be able to walk 1600 feet in 6 minutes to negotiate around the home and community safely in 4 weeks.   Baseline 1430 feet at evaluation   Time 4   Period Weeks   Status New   OT LONG TERM GOAL #2   Title Patient will complete HEP for maximal daily exercises with modified independence in 4 weeks   Baseline has not been consistent with HEP for the last 2 months and needs instruction   Time 4   Period Weeks   Status New   OT LONG TERM GOAL #3   Title Patient will transfer from sit to stand without the use of arms safely and independently from a variety of chairs/surfaces in 4 weeks.    Baseline difficulty from lower surfaces, 5 times sit to stand at eval 10 secs   Time 4   Period Weeks   Status New   OT LONG TERM GOAL #4   Title Patient will demonstrate 100% legibility with handwriting for  name and address with use of normal sized letters   Baseline micrographia noted on eval   Time 4   Period Weeks   Status New   OT LONG TERM GOAL #5   Title Patient will complete hair care with modified independence, demonstrating reaching to top of head for 1-2 minutes at a time to complete task.   Baseline unable   Time 4   Period Weeks   Status New  Plan - 01/17/16 1654    Clinical Impression Statement Patient was limited this weekend with pain from a migraine.  She has progressed to performing maximal daily exercises in the clinic with SBA and cues from therapist for form and technique.  She has improved with her functional component tasks and wanted to add some handwriting skills to her program as well as manipulation of small 1/2 inch objects. Continue to work towards goals.    Pt will benefit from skilled therapeutic intervention in order to improve on the following deficits (Retired) Decreased endurance;Decreased strength;Impaired flexibility;Decreased balance;Decreased mobility;Difficulty walking;Decreased cognition;Pain;Decreased coordination;Impaired UE functional use   Rehab Potential Good   OT Frequency 4x / week   OT Duration 4 weeks   Consulted and Agree with Plan of Care Patient        Problem List Patient Active Problem List   Diagnosis Date Noted  . Parkinson disease ([place]) 01/10/2016  . DNR (do not resuscitate) 05/22/2015  . Tremor 02/07/2015  . Dysphagia, pharyngoesophageal phase 02/07/2015  . Hypercalcemia 09/14/2013  . Unspecified vitamin D deficiency 04/27/2013  . Breast cancer, left breast ([place]) 09/16/2012  . Osteoporosis, post-menopausal 03/14/2012  . AORTIC ANEURYSM 01/01/2011  . OSTEOARTHRITIS, CERVICAL SPINE 08/15/2009  . HYPERGLYCEMIA, FASTING 12/06/2008  . ALLERGIC RHINITIS 03/25/2008  . Chronic obstructive airway disease with asthma ([place]) 03/25/2008  . BREAST CANCER, HX OF 03/10/2008  . Mixed hyperlipidemia 02/10/2007  .  Essential hypertension 02/10/2007   Achilles Dunk, OTR/L, CLT  Lovett,Amy 01/17/2016, 4:57 PM  [place] MAIN [facility] SERVICES [address] [place], [place], [address] Phone: [phone]   Fax:  [phone]  Name: MCCAYLA KELLOUGH MRN: [id number] Date of Birth: [birth year]

## 2016-01-18 ENCOUNTER — Ambulatory Visit: Payer: Medicare Other | Admitting: Occupational Therapy

## 2016-01-18 ENCOUNTER — Encounter: Payer: Self-pay | Admitting: Occupational Therapy

## 2016-01-18 DIAGNOSIS — Z741 Need for assistance with personal care: Secondary | ICD-10-CM

## 2016-01-18 DIAGNOSIS — G20 Parkinson's disease: Secondary | ICD-10-CM | POA: Diagnosis not present

## 2016-01-18 DIAGNOSIS — R278 Other lack of coordination: Secondary | ICD-10-CM

## 2016-01-18 DIAGNOSIS — M6281 Muscle weakness (generalized): Secondary | ICD-10-CM

## 2016-01-18 DIAGNOSIS — R460 Very low level of personal hygiene: Secondary | ICD-10-CM

## 2016-01-18 DIAGNOSIS — G20A1 Parkinson's disease without dyskinesia, without mention of fluctuations: Secondary | ICD-10-CM

## 2016-01-18 DIAGNOSIS — R262 Difficulty in walking, not elsewhere classified: Secondary | ICD-10-CM

## 2016-01-18 NOTE — Therapy (Signed)
[place] MAIN [facility] SERVICES [place], [place], [address] Phone: [phone]   Fax:  [phone]  Occupational Therapy Treatment  Patient Details  Name: Crystal Bass MRN: [id number] Date of Birth: Nov 13, 1941 Referring Provider: Tat  Encounter Date: 01/17/2016      OT End of Session - 01/17/16 1800    Visit Number 7   Number of Visits 17   Date for OT Re-Evaluation Mar 03, 2016   Authorization Type G codes 7   OT Start Time 1300   OT Stop Time 1358   OT Time Calculation (min) 58 min   Activity Tolerance Patient tolerated treatment well   Behavior During Therapy [facility] for tasks assessed/performed      Past Medical History  Diagnosis Date  . Pancreatitis     secondary to Cholelithiasis  . Breast cancer ([place]) 2002    infiltrative ductal carcinoma   . Lesion of breast 1992    right, benign  . Post-thoracotomy pain syndrome   . Uterine fibroid   . Endometriosis   . Ovarian cancer ([place]) 2004  . Aneurysm of aorta ([facility])     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  . Osteoporosis, post-menopausal 03/14/2012  . Ductal carcinoma of breast, estrogen receptor positive, stage 1 ([place]) 09/16/2012  . Pseudomonas pneumonia ([place]) 5/09  . COPD (chronic obstructive pulmonary disease) ([place]) 9/08  . Mastalgia 7/95  . Lung disease     secondary to [name] infection  . Hypercalcemia 09/14/2013  . Vitamin D deficiency   . Aortic root aneurysm ([place])   . Parkinson disease ([place]) 02/10/15    Past Surgical History  Procedure Laterality Date  . Lung biopsy  2002    [name], Dr [name]  . Cardiac catheterization  2007    essentially negation for significant CAD  . Appendectomy  2004  . Cholecystectomy  2001  . 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 Breast lumpectomy  1992    benign  . Mastectomy modified radical  2002    oral chemotheraphy (tamoxifen then Armidex) no radiation, Dr.[name]  .  Tonsillectomy  1946  . Breast biopsy  08/2004    right breast-benign  . Colonoscopy  2014    Dr [name] , due 2019    There were no vitals filed for this visit.  Visit Diagnosis:  Parkinson disease ([place])  Difficulty walking  Self-care deficit for hygiene  Muscle weakness (generalized)  Other lack of coordination      Subjective Assessment - 01/17/16 1800    Subjective  Patient reports she is trying to get ready for the staff to come in for a deep cleaning of her condo at [facility], states, "I have to move around some stuff and get things accessible for them."   Patient Stated Goals Patient reports her goal is to get back to her previous baseline if possible, be independent, improve her grip, transfers, walking, and strength.                        OT Treatments/Exercises (OP) - 01/18/16 1626    ADLs   ADL Comments Functional component tasks this date performed as follows: sit to stand, crossing legs to put on socks, hands reaching to the back of head for 15 secs each for hair care, stair negotiation, opening of containers. Handwriting for name, address and phone number for multiple  trials performed this date. Manipulation of small 1/2 inch sized objects with both right and left hands.    Neurological Re-education Exercises   Other Exercises 1 Patient seen for initial instruction of LSVT BIG exercises: LSVT Daily Session Maximal Daily Exercises: Sustained movements are designed to rescale the amplitude of movement output for generalization to daily functional activities. Performed as follows for 1 set of 10 repetitions each: Multi directional sustained movements- 1) Floor to ceiling, 2) Side to side. Multi directional Repetitive movements performed in standing and are designed to provide retraining effort needed for sustained muscle activation in tasks Performed as follows: 3) Step and reach forward, 4) Step and Reach Backwards, 5) Step and reach sideways, 6) Rock and  reach forward/backward, 7) Rock and reach sideways. Sit to stand from mat table on lowest setting with cues for weight shift, technique and CGA for 5 reps for 2 sets. Patient seen for functional mobility tasks this date with emphasis on gait speed, length of steps with short distance ambulation. Patient requires cues for BIG movements and cadence.All standing exercises performed this date with SBA, verbal and tactile cues as needed.   Other Exercises 2 Patient seen for functional mobility tasks for 800 feet with verbal cues for BIG movements, reciprocal arm swing. Patient performing steps 5 up and down with use of one hand rail with cues and SBA. Reciprocal toe tapping with occasional verbal cues and SBA.                OT Education - 01/17/16 1800    Education provided Yes   Education Details HEP, functional component tasks.   Person(s) Educated Patient   Methods Explanation;Demonstration;Verbal cues   Comprehension Verbal cues required;Returned demonstration;Verbalized understanding             OT Long Term Goals - 01/04/16 1443    OT LONG TERM GOAL #1   Title Patient will improve gait speed and endurance and be able to walk 1600 feet in 6 minutes to negotiate around the home and community safely in 4 weeks.   Baseline 1430 feet at evaluation   Time 4   Period Weeks   Status New   OT LONG TERM GOAL #2   Title Patient will complete HEP for maximal daily exercises with modified independence in 4 weeks   Baseline has not been consistent with HEP for the last 2 months and needs instruction   Time 4   Period Weeks   Status New   OT LONG TERM GOAL #3   Title Patient will transfer from sit to stand without the use of arms safely and independently from a variety of chairs/surfaces in 4 weeks.    Baseline difficulty from lower surfaces, 5 times sit to stand at eval 10 secs   Time 4   Period Weeks   Status New   OT LONG TERM GOAL #4   Title Patient will demonstrate 100%  legibility with handwriting for name and address with use of normal sized letters   Baseline micrographia noted on eval   Time 4   Period Weeks   Status New   OT LONG TERM GOAL #5   Title Patient will complete hair care with modified independence, demonstrating reaching to top of head for 1-2 minutes at a time to complete task.   Baseline unable   Time 4   Period Weeks   Status New               Plan -  01/18/16 1630    Clinical Impression Statement Patient has continued to make gains in all areas, improving her performance with maximal daily exercises, increased amplitude of movement with functional mobility and decreased cues for reciprocal arm swing.  Will continue to work towards increasing complexity of tasks and improving skills to perform necessary daily tasks.    Pt will benefit from skilled therapeutic intervention in order to improve on the following deficits (Retired) Decreased endurance;Decreased strength;Impaired flexibility;Decreased balance;Decreased mobility;Difficulty walking;Decreased cognition;Pain;Decreased coordination;Impaired UE functional use   Rehab Potential Good   OT Frequency 4x / week   OT Duration 4 weeks   OT Treatment/Interventions Self-care/ADL training;Therapeutic exercise;Functional Mobility Training;Patient/family education;Neuromuscular education;Balance training;Therapeutic exercises;DME and/or AE instruction;Therapeutic activities;Gait Training;Stair Training   Consulted and Agree with Plan of Care Patient        Problem List Patient Active Problem List   Diagnosis Date Noted  . Parkinson disease ([place]) 01/10/2016  . DNR (do not resuscitate) 05/22/2015  . Tremor 02/07/2015  . Dysphagia, pharyngoesophageal phase 02/07/2015  . Hypercalcemia 09/14/2013  . Unspecified vitamin D deficiency 04/27/2013  . Breast cancer, left breast ([place]) 09/16/2012  . Osteoporosis, post-menopausal 03/14/2012  . AORTIC ANEURYSM 01/01/2011  . OSTEOARTHRITIS,  CERVICAL SPINE 08/15/2009  . HYPERGLYCEMIA, FASTING 12/06/2008  . ALLERGIC RHINITIS 03/25/2008  . Chronic obstructive airway disease with asthma ([place]) 03/25/2008  . BREAST CANCER, HX OF 03/10/2008  . Mixed hyperlipidemia 02/10/2007  . Essential hypertension 02/10/2007   Achilles Dunk, OTR/L, CLT  Sherrian Nunnelley 01/18/2016, 4:33 PM  [place] MAIN [facility] SERVICES [address] [place], [place], [address] Phone: [phone]   Fax:  [phone]  Name: ILICIA VALLESE MRN: [id number] Date of Birth: [birth year]

## 2016-01-19 ENCOUNTER — Encounter: Payer: Self-pay | Admitting: Occupational Therapy

## 2016-01-19 ENCOUNTER — Ambulatory Visit: Payer: Medicare Other | Admitting: Occupational Therapy

## 2016-01-19 NOTE — Therapy (Signed)
[place] MAIN [facility] SERVICES [place], [place], [address] Phone: [phone]   Fax:  [phone]  Occupational Therapy Treatment  Patient Details  Name: Crystal Bass MRN: [id number] Date of Birth: 29-Apr-1941 Referring Provider: Tat  Encounter Date: 01/18/2016      OT End of Session - 01/19/16 1327    Visit Number 8   Number of Visits 17   Date for OT Re-Evaluation 02-11-2016   Authorization Type G codes 8   OT Start Time 1302   OT Stop Time 1400   OT Time Calculation (min) 58 min   Activity Tolerance Patient tolerated treatment well   Behavior During Therapy [facility] for tasks assessed/performed      Past Medical History  Diagnosis Date  . Pancreatitis     secondary to Cholelithiasis  . Breast cancer ([place]) 2002    infiltrative ductal carcinoma   . Lesion of breast 1992    right, benign  . Post-thoracotomy pain syndrome   . Uterine fibroid   . Endometriosis   . Ovarian cancer ([place]) 2004  . Aneurysm of aorta ([facility])     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  . Osteoporosis, post-menopausal 03/14/2012  . Ductal carcinoma of breast, estrogen receptor positive, stage 1 ([place]) 09/16/2012  . Pseudomonas pneumonia ([place]) 5/09  . COPD (chronic obstructive pulmonary disease) ([place]) 9/08  . Mastalgia 7/95  . Lung disease     secondary to [name] infection  . Hypercalcemia 09/14/2013  . Vitamin D deficiency   . Aortic root aneurysm ([place])   . Parkinson disease ([place]) 02/10/15    Past Surgical History  Procedure Laterality Date  . Lung biopsy  2002    [name], Dr [name]  . Cardiac catheterization  2007    essentially negation for significant CAD  . Appendectomy  2004  . Cholecystectomy  2001  . 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 Breast lumpectomy  1992    benign  . Mastectomy modified radical  2002    oral chemotheraphy (tamoxifen then Armidex) no radiation, Dr.[name]  .  Tonsillectomy  1946  . Breast biopsy  08/2004    right breast-benign  . Colonoscopy  2014    Dr [name] , due 2019    There were no vitals filed for this visit.  Visit Diagnosis:  Parkinson disease ([place])  Difficulty walking  Self-care deficit for hygiene  Muscle weakness (generalized)  Other lack of coordination      Subjective Assessment - 01/19/16 1324    Subjective  Patient reports she stil has a migraine and hopes she won't get sick during session but wants to try to do as much as possible.     Patient Stated Goals Patient reports her goal is to get back to her previous baseline if possible, be independent, improve her grip, transfers, walking, and strength.     Currently in Pain? Yes   Pain Score 5    Pain Location Head   Pain Descriptors / Indicators Pounding;Headache   Pain Type Acute pain   Pain Onset In the past 7 days   Multiple Pain Sites No                      OT Treatments/Exercises (OP) - 01/18/16 1626    ADLs   ADL Comments Functional component tasks this date performed as follows: sit to stand,  crossing legs to put on socks, hands reaching to the back of head for 15 secs each for hair care, stair negotiation, opening of containers. Handwriting for name, address and phone number for multiple trials performed this date. Manipulation of small 1/2 inch sized objects with both right and left hands.    Neurological Re-education Exercises   Other Exercises 1 Patient seen for initial instruction of LSVT BIG exercises: LSVT Daily Session Maximal Daily Exercises: Sustained movements are designed to rescale the amplitude of movement output for generalization to daily functional activities. Performed as follows for 1 set of 10 repetitions each: Multi directional sustained movements- 1) Floor to ceiling, 2) Side to side. Multi directional Repetitive movements performed in standing and are designed to provide retraining effort needed for sustained muscle  activation in tasks Performed as follows: 3) Step and reach forward, 4) Step and Reach Backwards, 5) Step and reach sideways, 6) Rock and reach forward/backward, 7) Rock and reach sideways. Sit to stand from mat table on lowest setting with cues for weight shift, technique and CGA for 5 reps for 2 sets. Patient seen for functional mobility tasks this date with emphasis on gait speed, length of steps with short distance ambulation. Patient requires cues for BIG movements and cadence.All standing exercises performed this date with SBA, verbal and tactile cues as needed.   Other Exercises 2 Patient seen for functional mobility tasks for 800 feet with verbal cues for BIG movements, reciprocal arm swing. Patient performing steps 5 up and down with use of one hand rail with cues and SBA. Reciprocal toe tapping with occasional verbal cues and SBA.                OT Education - 01/19/16 1327    Education provided Yes   Education Details HEP   Person(s) Educated Patient   Methods Explanation;Demonstration;Verbal cues   Comprehension Verbal cues required;Returned demonstration;Verbalized understanding             OT Long Term Goals - 01/04/16 1443    OT LONG TERM GOAL #1   Title Patient will improve gait speed and endurance and be able to walk 1600 feet in 6 minutes to negotiate around the home and community safely in 4 weeks.   Baseline 1430 feet at evaluation   Time 4   Period Weeks   Status New   OT LONG TERM GOAL #2   Title Patient will complete HEP for maximal daily exercises with modified independence in 4 weeks   Baseline has not been consistent with HEP for the last 2 months and needs instruction   Time 4   Period Weeks   Status New   OT LONG TERM GOAL #3   Title Patient will transfer from sit to stand without the use of arms safely and independently from a variety of chairs/surfaces in 4 weeks.    Baseline difficulty from lower surfaces, 5 times sit to stand at eval 10  secs   Time 4   Period Weeks   Status New   OT LONG TERM GOAL #4   Title Patient will demonstrate 100% legibility with handwriting for name and address with use of normal sized letters   Baseline micrographia noted on eval   Time 4   Period Weeks   Status New   OT LONG TERM GOAL #5   Title Patient will complete hair care with modified independence, demonstrating reaching to top of head for 1-2 minutes at a time to complete task.  Baseline unable   Time 4   Period Weeks   Status New               Plan - 01/19/16 1328    Clinical Impression Statement Patient limited this date by a headache, increased rest breaks as needed.  She is progressing with exercises requiring decreased cues and SBA to supervision for most exercises.  Occasional cues for reciprocal arm swing during functional mobility tasks.    Pt will benefit from skilled therapeutic intervention in order to improve on the following deficits (Retired) Decreased endurance;Decreased strength;Impaired flexibility;Decreased balance;Decreased mobility;Difficulty walking;Decreased cognition;Pain;Decreased coordination;Impaired UE functional use   Rehab Potential Good   OT Frequency 4x / week   OT Duration 4 weeks   OT Treatment/Interventions Self-care/ADL training;Therapeutic exercise;Functional Mobility Training;Patient/family education;Neuromuscular education;Balance training;Therapeutic exercises;DME and/or AE instruction;Therapeutic activities;Gait Training;Stair Training   Consulted and Agree with Plan of Care Patient        Problem List Patient Active Problem List   Diagnosis Date Noted  . Parkinson disease ([place]) 01/10/2016  . DNR (do not resuscitate) 05/22/2015  . Tremor 02/07/2015  . Dysphagia, pharyngoesophageal phase 02/07/2015  . Hypercalcemia 09/14/2013  . Unspecified vitamin D deficiency 04/27/2013  . Breast cancer, left breast ([place]) 09/16/2012  . Osteoporosis, post-menopausal 03/14/2012  . AORTIC  ANEURYSM 01/01/2011  . OSTEOARTHRITIS, CERVICAL SPINE 08/15/2009  . HYPERGLYCEMIA, FASTING 12/06/2008  . ALLERGIC RHINITIS 03/25/2008  . Chronic obstructive airway disease with asthma ([place]) 03/25/2008  . BREAST CANCER, HX OF 03/10/2008  . Mixed hyperlipidemia 02/10/2007  . Essential hypertension 02/10/2007   Amy T Tomasita Morrow, OTR/L, CLT  Lovett,Amy 01/19/2016, 1:31 PM  [place] MAIN [facility] SERVICES [address] [place], [place], [address] Phone: [phone]   Fax:  [phone]  Name: VIANA SHAY MRN: [id number] Date of Birth: 1941/09/27

## 2016-01-20 ENCOUNTER — Encounter: Payer: Self-pay | Admitting: Occupational Therapy

## 2016-01-20 ENCOUNTER — Ambulatory Visit: Payer: Medicare Other | Admitting: Occupational Therapy

## 2016-01-20 DIAGNOSIS — G20 Parkinson's disease: Secondary | ICD-10-CM

## 2016-01-20 DIAGNOSIS — M6281 Muscle weakness (generalized): Secondary | ICD-10-CM

## 2016-01-20 DIAGNOSIS — Z741 Need for assistance with personal care: Secondary | ICD-10-CM

## 2016-01-20 DIAGNOSIS — R262 Difficulty in walking, not elsewhere classified: Secondary | ICD-10-CM

## 2016-01-20 DIAGNOSIS — R460 Very low level of personal hygiene: Secondary | ICD-10-CM

## 2016-01-20 DIAGNOSIS — R278 Other lack of coordination: Secondary | ICD-10-CM

## 2016-01-20 NOTE — Therapy (Signed)
[place] MAIN [facility] SERVICES [address] [place], [place], [address] Phone: [phone]   Fax:  [phone]  Occupational Therapy Treatment  Patient Details  Name: Crystal Bass MRN: [id number] Date of Birth: [birth year] Referring Provider: Tat  Encounter Date: 01/20/2016      OT End of Session - 01/20/16 1531    Visit Number 9   Number of Visits 17   Date for OT Re-Evaluation 2016/02/15   Authorization Type G codes 9   OT Start Time 1057   OT Stop Time 1153   OT Time Calculation (min) 56 min   Activity Tolerance Patient tolerated treatment well   Behavior During Therapy [facility] for tasks assessed/performed      Past Medical History  Diagnosis Date  . Pancreatitis     secondary to Cholelithiasis  . Breast cancer ([place]) 2002    infiltrative ductal carcinoma   . Lesion of breast 1992    right, benign  . Post-thoracotomy pain syndrome   . Uterine fibroid   . Endometriosis   . Ovarian cancer ([place]) 2004  . Aneurysm of aorta (H[facility])     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  . Osteoporosis, post-menopausal 03/14/2012  . Ductal carcinoma of breast, estrogen receptor positive, stage 1 ([place]) 09/16/2012  . Pseudomonas pneumonia ([place]) 5/09  . COPD (chronic obstructive pulmonary disease) ([place]) 9/08  . Mastalgia 7/95  . Lung disease     secondary to [name] infection  . Hypercalcemia 09/14/2013  . Vitamin D deficiency   . Aortic root aneurysm ([place])   . Parkinson disease ([place]) 02/10/15    Past Surgical History  Procedure Laterality Date  . Lung biopsy  2002    [name], Dr [name]  . Cardiac catheterization  2007    essentially negation for significant CAD  . Appendectomy  2004  . Cholecystectomy  2001  . 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 Breast lumpectomy  1992    benign  . Mastectomy modified radical  2002    oral chemotheraphy (tamoxifen then Armidex) no radiation, Dr.[name]  .  Tonsillectomy  1946  . Breast biopsy  08/2004    right breast-benign  . Colonoscopy  2014    Dr [name] , due 2019    There were no vitals filed for this visit.  Visit Diagnosis:  Difficulty walking  Parkinson disease [facility])  Self-care deficit for hygiene  Muscle weakness (generalized)  Other lack of coordination      Subjective Assessment - 01/20/16 1528    Subjective  Patient reports she is doing well at home with exercises, no migraine this morning but she had an upset stomach this morning which is pretty common for her.    Patient Stated Goals Patient reports her goal is to get back to her previous baseline if possible, be independent, improve her grip, transfers, walking, and strength.     Currently in Pain? No/denies   Pain Score 0-No pain   Multiple Pain Sites No                      OT Treatments/Exercises (OP) - 01/20/16 1529    ADLs   ADL Comments Functional component tasks this date performed as follows: sit to stand, crossing legs to put on socks, hands reaching to the back of head for 15 secs each for hair care, stair negotiation, opening of containers.  Handwriting for name, address and phone number for multiple trials performed this date. Manipulation of small 1/2 inch sized objects with both right and left hands.    Neurological Re-education Exercises   Other Exercises 1 Patient seen for initial instruction of LSVT BIG exercises: LSVT Daily Session Maximal Daily Exercises: Sustained movements are designed to rescale the amplitude of movement output for generalization to daily functional activities. Performed as follows for 1 set of 10 repetitions each: Multi directional sustained movements- 1) Floor to ceiling, 2) Side to side. Multi directional Repetitive movements performed in standing and are designed to provide retraining effort needed for sustained muscle activation in tasks Performed as follows: 3) Step and reach forward, 4) Step and Reach Backwards,  5) Step and reach sideways, 6) Rock and reach forward/backward, 7) Rock and reach sideways. Sit to stand from mat table on lowest setting with cues for weight shift, technique and CGA for 5 reps for 2 sets. Patient seen for functional mobility tasks this date with emphasis on gait speed, length of steps with short distance ambulation. Patient requires cues for BIG movements and cadence.All standing exercises performed this date with SBA, verbal and tactile cues as needed.   Other Exercises 2 Patient seen for functional mobility tasks for 950 feet performed outdoors this date with emphasis on sloped areas and uneven surfaces with verbal cues for BIG movements, reciprocal arm swing. Patient performing steps 5 up and down with use of one hand rail with cues and SBA. Reciprocal toe tapping with occasional verbal cues and SBA.                OT Education - 01/20/16 1531    Education provided Yes   Education Details HEP   Person(s) Educated Patient   Methods Explanation;Demonstration;Verbal cues   Comprehension Verbal cues required;Returned demonstration;Verbalized understanding             OT Long Term Goals - 01/04/16 1443    OT LONG TERM GOAL #1   Title Patient will improve gait speed and endurance and be able to walk 1600 feet in 6 minutes to negotiate around the home and community safely in 4 weeks.   Baseline 1430 feet at evaluation   Time 4   Period Weeks   Status New   OT LONG TERM GOAL #2   Title Patient will complete HEP for maximal daily exercises with modified independence in 4 weeks   Baseline has not been consistent with HEP for the last 2 months and needs instruction   Time 4   Period Weeks   Status New   OT LONG TERM GOAL #3   Title Patient will transfer from sit to stand without the use of arms safely and independently from a variety of chairs/surfaces in 4 weeks.    Baseline difficulty from lower surfaces, 5 times sit to stand at eval 10 secs   Time 4    Period Weeks   Status New   OT LONG TERM GOAL #4   Title Patient will demonstrate 100% legibility with handwriting for name and address with use of normal sized letters   Baseline micrographia noted on eval   Time 4   Period Weeks   Status New   OT LONG TERM GOAL #5   Title Patient will complete hair care with modified independence, demonstrating reaching to top of head for 1-2 minutes at a time to complete task.   Baseline unable   Time 4   Period Weeks  Status New               Plan - 01/20/16 1531    Clinical Impression Statement Patient continues to progress in all areas, will plan to reassess performance next session with 6 minute walk test and 5 times sit to stand.  She was short of breath more this date with ambulation outdoors with sloped, winding paths and uneven surfaces.  Rest breaks as needed.  Will continue to add complexity to skills in coming sessions to further challenge patient.   Pt will benefit from skilled therapeutic intervention in order to improve on the following deficits (Retired) Decreased endurance;Decreased strength;Impaired flexibility;Decreased balance;Decreased mobility;Difficulty walking;Decreased cognition;Pain;Decreased coordination;Impaired UE functional use   Rehab Potential Good   OT Frequency 4x / week   OT Duration 4 weeks   OT Treatment/Interventions Self-care/ADL training;Therapeutic exercise;Functional Mobility Training;Patient/family education;Neuromuscular education;Balance training;Therapeutic exercises;DME and/or AE instruction;Therapeutic activities;Gait Training;Stair Training   Consulted and Agree with Plan of Care Patient        Problem List Patient Active Problem List   Diagnosis Date Noted  . Parkinson disease ([place]) 01/10/2016  . DNR (do not resuscitate) 05/22/2015  . Tremor 02/07/2015  . Dysphagia, pharyngoesophageal phase 02/07/2015  . Hypercalcemia 09/14/2013  . Unspecified vitamin D deficiency 04/27/2013  . Breast  cancer, left breast ([place]) 09/16/2012  . Osteoporosis, post-menopausal 03/14/2012  . AORTIC ANEURYSM 01/01/2011  . OSTEOARTHRITIS, CERVICAL SPINE 08/15/2009  . HYPERGLYCEMIA, FASTING 12/06/2008  . ALLERGIC RHINITIS 03/25/2008  . Chronic obstructive airway disease with asthma ([place]) 03/25/2008  . BREAST CANCER, HX OF 03/10/2008  . Mixed hyperlipidemia 02/10/2007  . Essential hypertension 02/10/2007   Achilles Dunk, OTR/L, CLT  Shed Nixon 01/20/2016, 3:34 PM  [place] MAIN [facility] SERVICES [address] [place], [place], [address] Phone: [phone]   Fax:  [phone]  Name: AHLIVIA GAINS MRN: [id number] Date of Birth: [birth year]

## 2016-01-23 ENCOUNTER — Ambulatory Visit: Payer: Medicare Other | Admitting: Occupational Therapy

## 2016-01-23 DIAGNOSIS — G20 Parkinson's disease: Secondary | ICD-10-CM | POA: Diagnosis not present

## 2016-01-23 DIAGNOSIS — R262 Difficulty in walking, not elsewhere classified: Secondary | ICD-10-CM

## 2016-01-23 DIAGNOSIS — R278 Other lack of coordination: Secondary | ICD-10-CM

## 2016-01-23 DIAGNOSIS — M6281 Muscle weakness (generalized): Secondary | ICD-10-CM

## 2016-01-23 DIAGNOSIS — Z741 Need for assistance with personal care: Secondary | ICD-10-CM

## 2016-01-23 DIAGNOSIS — R460 Very low level of personal hygiene: Secondary | ICD-10-CM

## 2016-01-24 ENCOUNTER — Encounter: Payer: Self-pay | Admitting: Occupational Therapy

## 2016-01-24 ENCOUNTER — Ambulatory Visit: Payer: Medicare Other | Admitting: Occupational Therapy

## 2016-01-24 DIAGNOSIS — G20 Parkinson's disease: Secondary | ICD-10-CM | POA: Diagnosis not present

## 2016-01-24 DIAGNOSIS — R278 Other lack of coordination: Secondary | ICD-10-CM

## 2016-01-24 DIAGNOSIS — R460 Very low level of personal hygiene: Secondary | ICD-10-CM

## 2016-01-24 DIAGNOSIS — Z741 Need for assistance with personal care: Secondary | ICD-10-CM

## 2016-01-24 DIAGNOSIS — M6281 Muscle weakness (generalized): Secondary | ICD-10-CM

## 2016-01-24 DIAGNOSIS — R262 Difficulty in walking, not elsewhere classified: Secondary | ICD-10-CM

## 2016-01-24 NOTE — Therapy (Signed)
[place] MAIN [facility] SERVICES [address] [place], [place], [address] Phone: [phone]   Fax:  [phone]  Occupational Therapy Treatment  Patient Details  Name: Crystal Bass MRN: [id number] Date of Birth: [birth year] Referring Provider: Tat  Encounter Date: 01/24/2016      OT End of Session - 01/24/16 1706    Visit Number 11   Number of Visits 17   Date for OT Re-Evaluation 02-19-2016   Authorization Type G codes 11   OT Start Time 6962   OT Stop Time 1405   OT Time Calculation (min) 62 min   Activity Tolerance Patient tolerated treatment well   Behavior During Therapy [facility] for tasks assessed/performed      Past Medical History  Diagnosis Date  . Pancreatitis     secondary to Cholelithiasis  . Breast cancer ([place]) 2002    infiltrative ductal carcinoma   . Lesion of breast 1992    right, benign  . Post-thoracotomy pain syndrome   . Uterine fibroid   . Endometriosis   . Ovarian cancer ([place]) 2004  . Aneurysm of aorta (H[facility])     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  . Osteoporosis, post-menopausal 03/14/2012  . Ductal carcinoma of breast, estrogen receptor positive, stage 1 ([place]) 09/16/2012  . Pseudomonas pneumonia ([place]) 5/09  . COPD (chronic obstructive pulmonary disease) ([place]) 9/08  . Mastalgia 7/95  . Lung disease     secondary to [name] infection  . Hypercalcemia 09/14/2013  . Vitamin D deficiency   . Aortic root aneurysm ([place])   . Parkinson disease ([place]) 02/10/15    Past Surgical History  Procedure Laterality Date  . Lung biopsy  2002    [name], Dr [name]  . Cardiac catheterization  2007    essentially negation for significant CAD  . Appendectomy  2004  . Cholecystectomy  2001  . 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 Breast lumpectomy  1992    benign  . Mastectomy modified radical  2002    oral chemotheraphy (tamoxifen then Armidex) no radiation, Dr.[name]  .  Tonsillectomy  1946  . Breast biopsy  08/2004    right breast-benign  . Colonoscopy  2014    Dr [name] , due 2019    There were no vitals filed for this visit.  Visit Diagnosis:  Parkinson disease ([place])  Difficulty walking  Self-care deficit for hygiene  Muscle weakness (generalized)  Other lack of coordination      Subjective Assessment - 01/24/16 1700    Subjective  Patient reports she is having a good day, no headache or stomach issues today.  Did a set of exercises this morning.     Patient Stated Goals Patient reports her goal is to get back to her previous baseline if possible, be independent, improve her grip, transfers, walking, and strength.     Currently in Pain? No/denies   Pain Score 0-No pain                      OT Treatments/Exercises (OP) - 01/24/16 1700    ADLs   ADL Comments Functional component tasks this date performed as follows: sit to stand, crossing legs to put on socks, hands reaching to the back of head for 15 secs each for hair care, stair negotiation, opening of containers. Handwriting for name, address and phone number for multiple trials performed  this date. Manipulation of small 1/2 inch sized objects with both right and left hands.   Neurological Re-education Exercises   Other Exercises 1 Patient seen for initial instruction of LSVT BIG exercises: LSVT Daily Session Maximal Daily Exercises: Sustained movements are designed to rescale the amplitude of movement output for generalization to daily functional activities. Performed as follows for 1 set of 10 repetitions each: Multi directional sustained movements- 1) Floor to ceiling, 2) Side to side. Multi directional Repetitive movements performed in standing and are designed to provide retraining effort needed for sustained muscle activation in tasks Performed as follows: 3) Step and reach forward, 4) Step and Reach Backwards, 5) Step and reach sideways, 6) Rock and reach forward/backward,  7) Rock and reach sideways. Sit to stand from mat table on lowest setting with cues for weight shift, technique and CGA for 5 reps for 2 sets. Patient seen for functional mobility tasks this date with emphasis on gait speed, length of steps with short distance ambulation. Patient requires cues for BIG movements and cadence.All standing exercises performed this date with SBA, verbal and tactile cues as needed.   Other Exercises 2 Patient seen for functional mobility tasks this date with emphasis on amplitude of gait and reciprocal arm swing.  Patient wanted to carry her Iphone with her while walking to see how many steps she took, registered as 810 steps, 73 steps per 100 feet.                OT Education - 01/24/16 1704    Education provided Yes   Education Details functional component tasks, HEP   Person(s) Educated Patient   Methods Explanation;Demonstration;Verbal cues   Comprehension Verbal cues required;Returned demonstration;Verbalized understanding             OT Long Term Goals - 01/24/16 1558    OT LONG TERM GOAL #1   Title Patient will improve gait speed and endurance and be able to walk 1600 feet in 6 minutes to negotiate around the home and community safely in 4 weeks.   Baseline 1430 feet at evaluation 1440 at 10th visit   Period Days   Status On-going   OT LONG TERM GOAL #2   Title Patient will complete HEP for maximal daily exercises with modified independence in 4 weeks   Time 4   Period Weeks   Status On-going   OT LONG TERM GOAL #3   Title Patient will transfer from sit to stand without the use of arms safely and independently from a variety of chairs/surfaces in 4 weeks.    Baseline difficulty from lower surfaces, 5 times sit to stand at eval 10 secs, 10th visit 9 secs.   Time 4   Period Weeks   Status On-going   OT LONG TERM GOAL #4   Title Patient will demonstrate 100% legibility with handwriting for name and address with use of normal sized  letters   Time 4   Period Weeks   Status Partially Met   OT LONG TERM GOAL #5   Title Patient will complete hair care with modified independence, demonstrating reaching to top of head for 1-2 minutes at a time to complete task.   Time 4   Period Weeks   Status On-going               Plan - 01/24/16 1706    Clinical Impression Statement Patient very motivated by numbers and wants to increase her distance for 6 minute walk test.  Discussed the distance doesn't matter as much as her gait pattern, keeping the size of steps big and performing reciprocal arm swings.  She plans to increase her walking at home to see additional progress.  She feels her exercises are going well at home and she is remembering more of the cues and techniques without referring to her handouts.     Pt will benefit from skilled therapeutic intervention in order to improve on the following deficits (Retired) Decreased endurance;Decreased strength;Impaired flexibility;Decreased balance;Decreased mobility;Difficulty walking;Decreased cognition;Pain;Decreased coordination;Impaired UE functional use   Rehab Potential Good   OT Frequency 4x / week   OT Duration 4 weeks   OT Treatment/Interventions Self-care/ADL training;Therapeutic exercise;Functional Mobility Training;Patient/family education;Neuromuscular education;Balance training;Therapeutic exercises;DME and/or AE instruction;Therapeutic activities;Gait Training;Stair Training   Consulted and Agree with Plan of Care Patient          G-Codes - 02-05-16 1553    Functional Assessment Tool Used 5 times sit to stand, 6 minute walk test, self care assessment, clinical judgment   Functional Limitation Self care   Self Care Current Status (I7867) At least 1 percent but less than 20 percent impaired, limited or restricted   Self Care Goal Status (E7209) 0 percent impaired, limited or restricted      Problem List Patient Active Problem List   Diagnosis Date Noted   . Parkinson disease ([place]) 01/10/2016  . DNR (do not resuscitate) 05/22/2015  . Tremor 02/07/2015  . Dysphagia, pharyngoesophageal phase 02/07/2015  . Hypercalcemia 09/14/2013  . Unspecified vitamin D deficiency 04/27/2013  . Breast cancer, left breast ([place]) 09/16/2012  . Osteoporosis, post-menopausal 03/14/2012  . AORTIC ANEURYSM 01/01/2011  . OSTEOARTHRITIS, CERVICAL SPINE 08/15/2009  . HYPERGLYCEMIA, FASTING 12/06/2008  . ALLERGIC RHINITIS 03/25/2008  . Chronic obstructive airway disease with asthma ([place]) 03/25/2008  . BREAST CANCER, HX OF 03/10/2008  . Mixed hyperlipidemia 02/10/2007  . Essential hypertension 02/10/2007   Kable Haywood T Tomasita Morrow, OTR/L, CLT  Sherial Ebrahim 2016/02/05, 5:09 PM  [place] MAIN [facility] SERVICES [address] [place], [place], [address] Phone: [phone]   Fax:  [phone]  Name: Crystal Bass MRN: [id number] Date of Birth: [birth year]

## 2016-01-24 NOTE — Therapy (Signed)
[place] MAIN [facility] SERVICES [place], [place], [address] Phone: [phone]   Fax:  [phone]  Occupational Therapy Treatment/Progress Note Patient seen 01/04/2016 to 01/24/2016  Patient Details  Name: Crystal Bass MRN: [id number] Date of Birth: [birth year] Referring Provider: Tat  Encounter Date: 01/23/2016      OT End of Session - 01/23/16 1552    Visit Number 10   Number of Visits 17   Date for OT Re-Evaluation 01-Mar-2016   Authorization Type G codes 10   OT Start Time 1300   OT Stop Time 1400   OT Time Calculation (min) 60 min   Activity Tolerance Patient tolerated treatment well   Behavior During Therapy [facility] for tasks assessed/performed      Past Medical History  Diagnosis Date  . Pancreatitis     secondary to Cholelithiasis  . Breast cancer ([place]) 2002    infiltrative ductal carcinoma   . Lesion of breast 1992    right, benign  . Post-thoracotomy pain syndrome   . Uterine fibroid   . Endometriosis   . Ovarian cancer ([place]) 2004  . Aneurysm of aorta (H[facility])     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  . Osteoporosis, post-menopausal 03/14/2012  . Ductal carcinoma of breast, estrogen receptor positive, stage 1 ([place]) 09/16/2012  . Pseudomonas pneumonia ([place]) 5/09  . COPD (chronic obstructive pulmonary disease) ([place]) 9/08  . Mastalgia 7/95  . Lung disease     secondary to [name] infection  . Hypercalcemia 09/14/2013  . Vitamin D deficiency   . Aortic root aneurysm ([place])   . Parkinson disease ([place]) 02/10/15    Past Surgical History  Procedure Laterality Date  . Lung biopsy  2002    [name], Dr [name]  . Cardiac catheterization  2007    essentially negation for significant CAD  . Appendectomy  2004  . Cholecystectomy  2001  . 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 Breast lumpectomy  1992    benign  . Mastectomy modified radical  2002    oral chemotheraphy (tamoxifen  then Armidex) no radiation, Dr.[name]  . Tonsillectomy  1946  . Breast biopsy  08/2004    right breast-benign  . Colonoscopy  2014    Dr [name] , due 2019    There were no vitals filed for this visit.  Visit Diagnosis:  Parkinson disease ([place])  Difficulty walking  Self-care deficit for hygiene  Muscle weakness (generalized)  Other lack of coordination      Subjective Assessment - 01/23/16 1547    Subjective  Patient reports she is having stomach issues this date and not sure how she will perform today.  No headache.   Patient Stated Goals Patient reports her goal is to get back to her previous baseline if possible, be independent, improve her grip, transfers, walking, and strength.     Currently in Pain? No/denies   Pain Score 0-No pain                      OT Treatments/Exercises (OP) - 01/23/16 1549    ADLs   ADL Comments Functional component tasks this date performed as follows: sit to stand, crossing legs to put on socks, hands reaching to the back of head for 15 secs each for hair care, stair negotiation, opening of containers. Handwriting for name, address and phone number for multiple  trials performed this date. Manipulation of small 1/2 inch sized objects with both right and left hands.    Neurological Re-education Exercises   Other Exercises 1 Patient seen for initial instruction of LSVT BIG exercises: LSVT Daily Session Maximal Daily Exercises: Sustained movements are designed to rescale the amplitude of movement output for generalization to daily functional activities. Performed as follows for 1 set of 10 repetitions each: Multi directional sustained movements- 1) Floor to ceiling, 2) Side to side. Multi directional Repetitive movements performed in standing and are designed to provide retraining effort needed for sustained muscle activation in tasks Performed as follows: 3) Step and reach forward, 4) Step and Reach Backwards, 5) Step and reach  sideways, 6) Rock and reach forward/backward, 7) Rock and reach sideways. Sit to stand from mat table on lowest setting with cues for weight shift, technique and CGA for 5 reps for 2 sets. Patient seen for functional mobility tasks this date with emphasis on gait speed, length of steps with short distance ambulation. Patient requires cues for BIG movements and cadence.All standing exercises performed this date with SBA, verbal and tactile cues as needed.   Other Exercises 2 Patient seen for reassesmsent of 5 times sit to stand, 9 sec, 6 minute walk test 1140 feet.                  OT Education - 01/23/16 1551    Education provided Yes   Education Details HEP, 6 minute walk test   Person(s) Educated Patient   Methods Explanation;Demonstration;Verbal cues   Comprehension Verbal cues required;Returned demonstration;Verbalized understanding             OT Long Term Goals - 01/24/16 1558    OT LONG TERM GOAL #1   Title Patient will improve gait speed and endurance and be able to walk 1600 feet in 6 minutes to negotiate around the home and community safely in 4 weeks.   Baseline 1430 feet at evaluation 1440 at 10th visit   Period Days   Status On-going   OT LONG TERM GOAL #2   Title Patient will complete HEP for maximal daily exercises with modified independence in 4 weeks   Time 4   Period Weeks   Status On-going   OT LONG TERM GOAL #3   Title Patient will transfer from sit to stand without the use of arms safely and independently from a variety of chairs/surfaces in 4 weeks.    Baseline difficulty from lower surfaces, 5 times sit to stand at eval 10 secs, 10th visit 9 secs.   Time 4   Period Weeks   Status On-going   OT LONG TERM GOAL #4   Title Patient will demonstrate 100% legibility with handwriting for name and address with use of normal sized letters   Time 4   Period Weeks   Status Partially Met   OT LONG TERM GOAL #5   Title Patient will complete hair care  with modified independence, demonstrating reaching to top of head for 1-2 minutes at a time to complete task.   Time 4   Period Weeks   Status On-going               Plan - 01/23/16 1553    Clinical Impression Statement Patient reassessed this date for 6 minute walk test, she achieved 10 feet greater distance than at evaluation however, patient did not feel well today with unsettled stomach.  5 times sit to stand in 9 secs  versus 10.   She has continued to progress with functional mobility, reciprocal arm swing and exercises.  Minimal cues at times for technique.  [place] to focus on improving skills to increase independence in daily tasks.    Pt will benefit from skilled therapeutic intervention in order to improve on the following deficits (Retired) Decreased endurance;Decreased strength;Impaired flexibility;Decreased balance;Decreased mobility;Difficulty walking;Decreased cognition;Pain;Decreased coordination;Impaired UE functional use   Rehab Potential Good   OT Frequency 4x / week   OT Duration 4 weeks   OT Treatment/Interventions Self-care/ADL training;Therapeutic exercise;Functional Mobility Training;Patient/family education;Neuromuscular education;Balance training;Therapeutic exercises;DME and/or AE instruction;Therapeutic activities;Gait Training;Stair Training   Consulted and Agree with Plan of Care Patient          G-Codes - 2016/02/22 1553    Functional Assessment Tool Used 5 times sit to stand, 6 minute walk test, self care assessment, clinical judgment   Functional Limitation Self care   Self Care Current Status (Y5183) At least 1 percent but less than 20 percent impaired, limited or restricted   Self Care Goal Status (F5825) 0 percent impaired, limited or restricted      Problem List Patient Active Problem List   Diagnosis Date Noted  . Parkinson disease ([place]) 01/10/2016  . DNR (do not resuscitate) 05/22/2015  . Tremor 02/07/2015  . Dysphagia, pharyngoesophageal  phase 02/07/2015  . Hypercalcemia 09/14/2013  . Unspecified vitamin D deficiency 04/27/2013  . Breast cancer, left breast ([place]) 09/16/2012  . Osteoporosis, post-menopausal 03/14/2012  . AORTIC ANEURYSM 01/01/2011  . OSTEOARTHRITIS, CERVICAL SPINE 08/15/2009  . HYPERGLYCEMIA, FASTING 12/06/2008  . ALLERGIC RHINITIS 03/25/2008  . Chronic obstructive airway disease with asthma ([place]) 03/25/2008  . BREAST CANCER, HX OF 03/10/2008  . Mixed hyperlipidemia 02/10/2007  . Essential hypertension 02/10/2007   Amy T Tomasita Morrow, OTR/L, CLT Lovett,Amy February 22, 2016, 3:59 PM  [place] MAIN [facility] SERVICES [address] [place], [place], [address] Phone: [phone]   Fax:  [phone]  Name: Crystal Bass MRN: [id number] Date of Birth: [birth year]

## 2016-01-25 ENCOUNTER — Ambulatory Visit: Payer: Medicare Other | Attending: Neurology | Admitting: Occupational Therapy

## 2016-01-25 DIAGNOSIS — R460 Very low level of personal hygiene: Secondary | ICD-10-CM | POA: Insufficient documentation

## 2016-01-25 DIAGNOSIS — G20A1 Parkinson's disease without dyskinesia, without mention of fluctuations: Secondary | ICD-10-CM

## 2016-01-25 DIAGNOSIS — Z741 Need for assistance with personal care: Secondary | ICD-10-CM

## 2016-01-25 DIAGNOSIS — G20 Parkinson's disease: Secondary | ICD-10-CM

## 2016-01-25 DIAGNOSIS — M6281 Muscle weakness (generalized): Secondary | ICD-10-CM | POA: Diagnosis present

## 2016-01-25 DIAGNOSIS — R262 Difficulty in walking, not elsewhere classified: Secondary | ICD-10-CM

## 2016-01-25 DIAGNOSIS — R278 Other lack of coordination: Secondary | ICD-10-CM

## 2016-01-26 ENCOUNTER — Encounter: Payer: Self-pay | Admitting: Occupational Therapy

## 2016-01-26 ENCOUNTER — Ambulatory Visit: Payer: Medicare Other | Admitting: Occupational Therapy

## 2016-01-26 DIAGNOSIS — R262 Difficulty in walking, not elsewhere classified: Secondary | ICD-10-CM

## 2016-01-26 DIAGNOSIS — R278 Other lack of coordination: Secondary | ICD-10-CM

## 2016-01-26 DIAGNOSIS — G20 Parkinson's disease: Secondary | ICD-10-CM

## 2016-01-26 DIAGNOSIS — R460 Very low level of personal hygiene: Secondary | ICD-10-CM

## 2016-01-26 DIAGNOSIS — M6281 Muscle weakness (generalized): Secondary | ICD-10-CM

## 2016-01-26 DIAGNOSIS — Z741 Need for assistance with personal care: Secondary | ICD-10-CM

## 2016-01-26 DIAGNOSIS — G20A1 Parkinson's disease without dyskinesia, without mention of fluctuations: Secondary | ICD-10-CM

## 2016-01-26 NOTE — Therapy (Signed)
[place] MAIN [facility] SERVICES [address] [place], [place], [address] Phone: [phone]   Fax:  [phone]  Occupational Therapy Treatment  Patient Details  Name: Crystal Bass MRN: [id number] Date of Birth: 07-24-1941 Referring Provider: Tat  Encounter Date: 01/26/2016      OT End of Session - 01/26/16 1630    Visit Number 13   Number of Visits 17   Date for OT Re-Evaluation 19-Feb-2016   Authorization Type G codes 13   OT Start Time 9381   OT Stop Time 1359   OT Time Calculation (min) 63 min   Activity Tolerance Patient tolerated treatment well   Behavior During Therapy [facility] for tasks assessed/performed      Past Medical History  Diagnosis Date  . Pancreatitis     secondary to Cholelithiasis  . Breast cancer ([place]) 2002    infiltrative ductal carcinoma   . Lesion of breast 1992    right, benign  . Post-thoracotomy pain syndrome   . Uterine fibroid   . Endometriosis   . Ovarian cancer ([place]) 2004  . Aneurysm of aorta (HCC)     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  . Osteoporosis, post-menopausal 03/14/2012  . Ductal carcinoma of breast, estrogen receptor positive, stage 1 ([place]) 09/16/2012  . Pseudomonas pneumonia ([place]) 5/09  . COPD (chronic obstructive pulmonary disease) ([place]) 9/08  . Mastalgia 7/95  . Lung disease     secondary to [name] infection  . Hypercalcemia 09/14/2013  . Vitamin D deficiency   . Aortic root aneurysm ([place])   . Parkinson disease ([place]) 02/10/15    Past Surgical History  Procedure Laterality Date  . Lung biopsy  2002    [name], Dr [name]  . Cardiac catheterization  2007    essentially negation for significant CAD  . Appendectomy  2004  . Cholecystectomy  2001  . 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 Breast lumpectomy  1992    benign  . Mastectomy modified radical  2002    oral chemotheraphy (tamoxifen then Armidex) no radiation, Dr.[name]  .  Tonsillectomy  1946  . Breast biopsy  08/2004    right breast-benign  . Colonoscopy  2014    Dr [name] , due 2019    There were no vitals filed for this visit.  Visit Diagnosis:  Parkinson disease ([place])  Difficulty walking  Self-care deficit for hygiene  Muscle weakness (generalized)  Other lack of coordination      Subjective Assessment - 01/26/16 1627    Subjective  Patient reports her back has been hurting some today, 4/10 pain.    Patient Stated Goals Patient reports her goal is to get back to her previous baseline if possible, be independent, improve her grip, transfers, walking, and strength.     Currently in Pain? Yes   Pain Score 4    Pain Location Back   Pain Orientation Mid   Pain Descriptors / Indicators Aching   Pain Type Chronic pain   Pain Onset Today   Multiple Pain Sites No                      OT Treatments/Exercises (OP) - 01/26/16 1628    ADLs   ADL Comments Functional component tasks this date performed as follows: sit to stand, crossing legs to put on socks, hands reaching to the back of head for  15 secs each for hair care, stair negotiation, opening of containers. Handwriting for name, address and phone number for multiple trials performed this date. Manipulation of small 1/2 inch sized objects with both right and left hands.   Neurological Re-education Exercises   Other Exercises 1 Patient seen for initial instruction of LSVT BIG exercises: LSVT Daily Session Maximal Daily Exercises: Sustained movements are designed to rescale the amplitude of movement output for generalization to daily functional activities. Performed as follows for 1 set of 10 repetitions each: Multi directional sustained movements- 1) Floor to ceiling, 2) Side to side. Multi directional Repetitive movements performed in standing and are designed to provide retraining effort needed for sustained muscle activation in tasks Performed as follows: 3) Step and reach forward, 4)  Step and Reach Backwards, 5) Step and reach sideways, 6) Rock and reach forward/backward, 7) Rock and reach sideways. Sit to stand from mat table on lowest setting with cues for weight shift, technique and CGA for 5 reps for 2 sets. Patient seen for functional mobility tasks this date with emphasis on gait speed, length of steps with short distance ambulation. Patient requires cues for BIG movements and cadence.All standing exercises performed this date with SBA, verbal and tactile cues as needed.   Other Exercises 2 Patient seen for functional mobility tasks this date with emphasis on amplitude of gait and reciprocal arm swing, 1200 feet this date. Rest breaks as needed, increased shortness of breath, O2 monitored and ranges between 90-95%.                OT Education - 01/26/16 1630    Education provided Yes   Education Details HEP, breathing during exercises and functional mobility   Person(s) Educated Patient   Methods Explanation;Demonstration;Verbal cues   Comprehension Verbal cues required;Returned demonstration;Verbalized understanding             OT Long Term Goals - 01/24/16 1558    OT LONG TERM GOAL #1   Title Patient will improve gait speed and endurance and be able to walk 1600 feet in 6 minutes to negotiate around the home and community safely in 4 weeks.   Baseline 1430 feet at evaluation 1440 at 10th visit   Period Days   Status On-going   OT LONG TERM GOAL #2   Title Patient will complete HEP for maximal daily exercises with modified independence in 4 weeks   Time 4   Period Weeks   Status On-going   OT LONG TERM GOAL #3   Title Patient will transfer from sit to stand without the use of arms safely and independently from a variety of chairs/surfaces in 4 weeks.    Baseline difficulty from lower surfaces, 5 times sit to stand at eval 10 secs, 10th visit 9 secs.   Time 4   Period Weeks   Status On-going   OT LONG TERM GOAL #4   Title Patient will  demonstrate 100% legibility with handwriting for name and address with use of normal sized letters   Time 4   Period Weeks   Status Partially Met   OT LONG TERM GOAL #5   Title Patient will complete hair care with modified independence, demonstrating reaching to top of head for 1-2 minutes at a time to complete task.   Time 4   Period Weeks   Status On-going               Plan - 01/26/16 1631    Clinical Impression Statement  Patient demonstrated increased shortness of breath this date and was monitored by therapist with pulse oximeter, O2 ranged between 90-95% and patient required frequent rest breaks. She has continued to progress with exercises and requires decreased cues each week she has been in therapy.  Occasional cues now for correct technique or to advance in complexity of tasks.  Patient continues to benefit from  skilled OT intervention for LSVT BIG intensive program.   Pt will benefit from skilled therapeutic intervention in order to improve on the following deficits (Retired) Decreased endurance;Decreased strength;Impaired flexibility;Decreased balance;Decreased mobility;Difficulty walking;Decreased cognition;Pain;Decreased coordination;Impaired UE functional use   Rehab Potential Good   OT Frequency 4x / week   OT Duration 4 weeks   OT Treatment/Interventions Self-care/ADL training;Therapeutic exercise;Functional Mobility Training;Patient/family education;Neuromuscular education;Balance training;Therapeutic exercises;DME and/or AE instruction;Therapeutic activities;Gait Training;Stair Training   Consulted and Agree with Plan of Care Patient        Problem List Patient Active Problem List   Diagnosis Date Noted  . Parkinson disease ([place]) 01/10/2016  . DNR (do not resuscitate) 05/22/2015  . Tremor 02/07/2015  . Dysphagia, pharyngoesophageal phase 02/07/2015  . Hypercalcemia 09/14/2013  . Unspecified vitamin D deficiency 04/27/2013  . Breast cancer, left breast  ([place]) 09/16/2012  . Osteoporosis, post-menopausal 03/14/2012  . AORTIC ANEURYSM 01/01/2011  . OSTEOARTHRITIS, CERVICAL SPINE 08/15/2009  . HYPERGLYCEMIA, FASTING 12/06/2008  . ALLERGIC RHINITIS 03/25/2008  . Chronic obstructive airway disease with asthma ([place]) 03/25/2008  . BREAST CANCER, HX OF 03/10/2008  . Mixed hyperlipidemia 02/10/2007  . Essential hypertension 02/10/2007  Achilles Dunk, OTR/L, CLT   Atoya Andrew 01/26/2016, 4:34 PM  [place] MAIN [facility] SERVICES [address] [place], [place], [address] Phone: [phone]   Fax:  [phone]  Name: Crystal Bass MRN: [id number] Date of Birth: [birth year]

## 2016-01-26 NOTE — Therapy (Signed)
[place] MAIN [facility] SERVICES [place], [place], [address] Phone: [phone]   Fax:  [phone]  Occupational Therapy Treatment  Patient Details  Name: Crystal Bass MRN: [id number] Date of Birth: 1941/07/27 Referring Provider: Tat  Encounter Date: 01/25/2016      OT End of Session - 01/26/16 1052    Visit Number 12   Number of Visits 17   Date for OT Re-Evaluation 2016/02/20   Authorization Type G codes 12   OT Start Time 1300   OT Stop Time 1355   OT Time Calculation (min) 55 min   Activity Tolerance Patient tolerated treatment well   Behavior During Therapy [facility] for tasks assessed/performed      Past Medical History  Diagnosis Date  . Pancreatitis     secondary to Cholelithiasis  . Breast cancer ([place]) 2002    infiltrative ductal carcinoma   . Lesion of breast 1992    right, benign  . Post-thoracotomy pain syndrome   . Uterine fibroid   . Endometriosis   . Ovarian cancer ([place]) 2004  . Aneurysm of aorta (H[facility])     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  . Osteoporosis, post-menopausal 03/14/2012  . Ductal carcinoma of breast, estrogen receptor positive, stage 1 ([place]) 09/16/2012  . Pseudomonas pneumonia ([place]) 5/09  . COPD (chronic obstructive pulmonary disease) ([place]) 9/08  . Mastalgia 7/95  . Lung disease     secondary to [name] infection  . Hypercalcemia 09/14/2013  . Vitamin D deficiency   . Aortic root aneurysm ([place])   . Parkinson disease ([place]) 02/10/15    Past Surgical History  Procedure Laterality Date  . Lung biopsy  2002    [name], Dr [name]  . Cardiac catheterization  2007    essentially negation for significant CAD  . Appendectomy  2004  . Cholecystectomy  2001  . 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 Breast lumpectomy  1992    benign  . Mastectomy modified radical  2002    oral chemotheraphy (tamoxifen then Armidex) no radiation, Dr.[name]  .  Tonsillectomy  1946  . Breast biopsy  08/2004    right breast-benign  . Colonoscopy  2014    Dr [name] , due 2019    There were no vitals filed for this visit.  Visit Diagnosis:  Parkinson disease ([place])  Difficulty walking  Self-care deficit for hygiene  Muscle weakness (generalized)  Other lack of coordination      Subjective Assessment - 01/25/16 1046    Subjective  Patient reports she doesn't feel as good today as yesterday, still some stomach issues. "I will do my best"   Patient Stated Goals Patient reports her goal is to get back to her previous baseline if possible, be independent, improve her grip, transfers, walking, and strength.     Currently in Pain? No/denies   Pain Score 0-No pain                      OT Treatments/Exercises (OP) - 01/25/16 1400    ADLs   ADL Comments Functional component tasks this date performed as follows: sit to stand, crossing legs to put on socks, hands reaching to the back of head for 15 secs each for hair care, stair negotiation, opening of containers. Handwriting for name, address and phone number for multiple trials performed this date. Manipulation of small  1/2 inch sized objects with both right and left hands.   Neurological Re-education Exercises   Other Exercises 1 Patient seen for initial instruction of LSVT BIG exercises: LSVT Daily Session Maximal Daily Exercises: Sustained movements are designed to rescale the amplitude of movement output for generalization to daily functional activities. Performed as follows for 1 set of 10 repetitions each: Multi directional sustained movements- 1) Floor to ceiling, 2) Side to side. Multi directional Repetitive movements performed in standing and are designed to provide retraining effort needed for sustained muscle activation in tasks Performed as follows: 3) Step and reach forward, 4) Step and Reach Backwards, 5) Step and reach sideways, 6) Rock and reach forward/backward, 7) Rock and  reach sideways. Sit to stand from mat table on lowest setting with cues for weight shift, technique and CGA for 5 reps for 2 sets. Patient seen for functional mobility tasks this date with emphasis on gait speed, length of steps with short distance ambulation. Patient requires cues for BIG movements and cadence.All standing exercises performed this date with SBA, verbal and tactile cues as needed.   Other Exercises 2 Patient seen for functional mobility tasks this date with emphasis on amplitude of gait and reciprocal arm swing. Patient carried her IPhone with her while walking to see how many steps she took, registered as 1036 steps, average of 73 steps per 100 feet, which is equivalent to around 1400 feet this date.  Rest breaks as needed.                 OT Education - 01/26/16 1052    Education provided Yes   Education Details home exercises   Person(s) Educated Patient   Methods Explanation;Demonstration;Verbal cues   Comprehension Verbal cues required;Returned demonstration;Verbalized understanding             OT Long Term Goals - 01/24/16 1558    OT LONG TERM GOAL #1   Title Patient will improve gait speed and endurance and be able to walk 1600 feet in 6 minutes to negotiate around the home and community safely in 4 weeks.   Baseline 1430 feet at evaluation 1440 at 10th visit   Period Days   Status On-going   OT LONG TERM GOAL #2   Title Patient will complete HEP for maximal daily exercises with modified independence in 4 weeks   Time 4   Period Weeks   Status On-going   OT LONG TERM GOAL #3   Title Patient will transfer from sit to stand without the use of arms safely and independently from a variety of chairs/surfaces in 4 weeks.    Baseline difficulty from lower surfaces, 5 times sit to stand at eval 10 secs, 10th visit 9 secs.   Time 4   Period Weeks   Status On-going   OT LONG TERM GOAL #4   Title Patient will demonstrate 100% legibility with handwriting  for name and address with use of normal sized letters   Time 4   Period Weeks   Status Partially Met   OT LONG TERM GOAL #5   Title Patient will complete hair care with modified independence, demonstrating reaching to top of head for 1-2 minutes at a time to complete task.   Time 4   Period Weeks   Status On-going               Plan - 01/26/16 1053    Clinical Impression Statement Patient requesting to use her iphone for a pedometer  as a means of feedback during functional mobility, she appears motivated by the numbers and pushes herself to do more than the day before.  She still has to take rest breaks as needed secondary to her shortness of breath at times but breaks are less often than when beginning treatment.  She is becoming more proficient with exercises and able to add in some complexity at times.  Stair negotiation improved and performing up to 5 reps up and down now.  Continue to work towards goals.   Pt will benefit from skilled therapeutic intervention in order to improve on the following deficits (Retired) Decreased endurance;Decreased strength;Impaired flexibility;Decreased balance;Decreased mobility;Difficulty walking;Decreased cognition;Pain;Decreased coordination;Impaired UE functional use   Rehab Potential Good   OT Frequency 4x / week   OT Duration 4 weeks   Consulted and Agree with Plan of Care Patient        Problem List Patient Active Problem List   Diagnosis Date Noted  . Parkinson disease ([place]) 01/10/2016  . DNR (do not resuscitate) 05/22/2015  . Tremor 02/07/2015  . Dysphagia, pharyngoesophageal phase 02/07/2015  . Hypercalcemia 09/14/2013  . Unspecified vitamin D deficiency 04/27/2013  . Breast cancer, left breast ([place]) 09/16/2012  . Osteoporosis, post-menopausal 03/14/2012  . AORTIC ANEURYSM 01/01/2011  . OSTEOARTHRITIS, CERVICAL SPINE 08/15/2009  . HYPERGLYCEMIA, FASTING 12/06/2008  . ALLERGIC RHINITIS 03/25/2008  . Chronic obstructive airway  disease with asthma ([place]) 03/25/2008  . BREAST CANCER, HX OF 03/10/2008  . Mixed hyperlipidemia 02/10/2007  . Essential hypertension 02/10/2007   Semone Orlov T Tomasita Morrow, OTR/L, CLT  Martyna Thorns 01/26/2016, 10:56 AM  [place] MAIN [facility] SERVICES [address] [place], [place], [address] Phone: [phone]   Fax:  [phone]  Name: Crystal Bass MRN: [id number] Date of Birth: [birth year]

## 2016-01-30 ENCOUNTER — Ambulatory Visit: Payer: Medicare Other | Admitting: Occupational Therapy

## 2016-01-30 DIAGNOSIS — Z741 Need for assistance with personal care: Secondary | ICD-10-CM

## 2016-01-30 DIAGNOSIS — G20 Parkinson's disease: Secondary | ICD-10-CM | POA: Diagnosis not present

## 2016-01-30 DIAGNOSIS — M6281 Muscle weakness (generalized): Secondary | ICD-10-CM

## 2016-01-30 DIAGNOSIS — R262 Difficulty in walking, not elsewhere classified: Secondary | ICD-10-CM

## 2016-01-30 DIAGNOSIS — R278 Other lack of coordination: Secondary | ICD-10-CM

## 2016-01-30 DIAGNOSIS — R460 Very low level of personal hygiene: Secondary | ICD-10-CM

## 2016-01-31 ENCOUNTER — Ambulatory Visit: Payer: Medicare Other | Admitting: Occupational Therapy

## 2016-01-31 ENCOUNTER — Encounter: Payer: Self-pay | Admitting: Occupational Therapy

## 2016-01-31 DIAGNOSIS — R262 Difficulty in walking, not elsewhere classified: Secondary | ICD-10-CM

## 2016-01-31 DIAGNOSIS — R460 Very low level of personal hygiene: Secondary | ICD-10-CM

## 2016-01-31 DIAGNOSIS — M6281 Muscle weakness (generalized): Secondary | ICD-10-CM

## 2016-01-31 DIAGNOSIS — G20 Parkinson's disease: Secondary | ICD-10-CM

## 2016-01-31 DIAGNOSIS — R278 Other lack of coordination: Secondary | ICD-10-CM

## 2016-01-31 DIAGNOSIS — Z741 Need for assistance with personal care: Secondary | ICD-10-CM

## 2016-01-31 NOTE — Therapy (Signed)
[place] MAIN [facility] SERVICES [address] [place], [place], [address] Phone: [phone]   Fax:  [phone]  Occupational Therapy Treatment  Patient Details  Name: Crystal Bass MRN: [id number] Date of Birth: June 02, 1941 Referring Provider: Tat  Encounter Date: 01/30/2016      OT End of Session - 01/31/16 1752    Visit Number 14   Number of Visits 17   Date for OT Re-Evaluation 02-15-2016   Authorization Type G codes 14   OT Start Time 1300   OT Stop Time 1358   OT Time Calculation (min) 58 min   Activity Tolerance Patient tolerated treatment well   Behavior During Therapy [facility] for tasks assessed/performed      Past Medical History  Diagnosis Date  . Pancreatitis     secondary to Cholelithiasis  . Breast cancer ([place]) 2002    infiltrative ductal carcinoma   . Lesion of breast 1992    right, benign  . Post-thoracotomy pain syndrome   . Uterine fibroid   . Endometriosis   . Ovarian cancer ([place]) 2004  . Aneurysm of aorta (H[facility])     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  . Osteoporosis, post-menopausal 03/14/2012  . Ductal carcinoma of breast, estrogen receptor positive, stage 1 ([place]) 09/16/2012  . Pseudomonas pneumonia ([place]) 5/09  . COPD (chronic obstructive pulmonary disease) ([place]) 9/08  . Mastalgia 7/95  . Lung disease     secondary to [name] infection  . Hypercalcemia 09/14/2013  . Vitamin D deficiency   . Aortic root aneurysm ([place])   . Parkinson disease ([place]) 02/10/15    Past Surgical History  Procedure Laterality Date  . Lung biopsy  2002    [name], Dr [name]  . Cardiac catheterization  2007    essentially negation for significant CAD  . Appendectomy  2004  . Cholecystectomy  2001  . 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 Breast lumpectomy  1992    benign  . Mastectomy modified radical  2002    oral chemotheraphy (tamoxifen then Armidex) no radiation, Dr.[name]  .  Tonsillectomy  1946  . Breast biopsy  08/2004    right breast-benign  . Colonoscopy  2014    Dr [name] , due 2019    There were no vitals filed for this visit.  Visit Diagnosis:  Parkinson disease ([place])  Difficulty walking  Self-care deficit for hygiene  Muscle weakness (generalized)  Other lack of coordination      Subjective Assessment - 01/30/16 1747    Subjective  Patient reports she has a busy week this week and may be late tomorrow by just a few minutes.   Patient Stated Goals Patient reports her goal is to get back to her previous baseline if possible, be independent, improve her grip, transfers, walking, and strength.     Currently in Pain? No/denies   Pain Score 0-No pain                      OT Treatments/Exercises (OP) - 01/30/16 1748    ADLs   ADL Comments Functional component tasks this date performed as follows: sit to stand, crossing legs to put on socks, hands reaching to the back of head for 15 secs each for hair care, stair negotiation, opening of containers. Handwriting for name, address and phone number for multiple trials performed this date. Manipulation of small  1/2 inch sized objects with both right and left hands.   Neurological Re-education Exercises   Other Exercises 1 Patient seen for initial instruction of LSVT BIG exercises: LSVT Daily Session Maximal Daily Exercises: Sustained movements are designed to rescale the amplitude of movement output for generalization to daily functional activities. Performed as follows for 1 set of 10 repetitions each: Multi directional sustained movements- 1) Floor to ceiling, 2) Side to side. Multi directional Repetitive movements performed in standing and are designed to provide retraining effort needed for sustained muscle activation in tasks Performed as follows: 3) Step and reach forward, 4) Step and Reach Backwards, 5) Step and reach sideways, 6) Rock and reach forward/backward, 7) Rock and reach  sideways. Sit to stand from mat table on lowest setting with cues for weight shift, technique for 10 reps. Patient seen for functional mobility tasks this date with emphasis on gait speed, length of steps with short distance ambulation. Patient requires cues for BIG movements and cadence.   Other Exercises 2 Patient seen for functional mobility tasks this date with emphasis on amplitude of gait and reciprocal arm swing, 1200 feet this date. Rest breaks as needed, increased shortness of breath, O2 monitored and ranges between 90-95%.                OT Education - 01/31/16 1751    Education provided Yes   Education Details HEP, maximal daily exercises, functional component tasks.   Person(s) Educated Patient   Methods Explanation;Demonstration;Verbal cues   Comprehension Verbal cues required;Returned demonstration;Verbalized understanding             OT Long Term Goals - 01/24/16 1558    OT LONG TERM GOAL #1   Title Patient will improve gait speed and endurance and be able to walk 1600 feet in 6 minutes to negotiate around the home and community safely in 4 weeks.   Baseline 1430 feet at evaluation 1440 at 10th visit   Period Days   Status On-going   OT LONG TERM GOAL #2   Title Patient will complete HEP for maximal daily exercises with modified independence in 4 weeks   Time 4   Period Weeks   Status On-going   OT LONG TERM GOAL #3   Title Patient will transfer from sit to stand without the use of arms safely and independently from a variety of chairs/surfaces in 4 weeks.    Baseline difficulty from lower surfaces, 5 times sit to stand at eval 10 secs, 10th visit 9 secs.   Time 4   Period Weeks   Status On-going   OT LONG TERM GOAL #4   Title Patient will demonstrate 100% legibility with handwriting for name and address with use of normal sized letters   Time 4   Period Weeks   Status Partially Met   OT LONG TERM GOAL #5   Title Patient will complete hair care  with modified independence, demonstrating reaching to top of head for 1-2 minutes at a time to complete task.   Time 4   Period Weeks   Status On-going               Plan - 01/30/16 1752    Clinical Impression Statement Patient now performing daily exercises with occasional minimal cues.  She continues to focus on functional component tasks and has been able to implement components into hierarchy tasks with success at home.  Increased shortness of breath this date and required increased rest breaks. Will  continue to prepare patient for discharge at the end of this week.   Pt will benefit from skilled therapeutic intervention in order to improve on the following deficits (Retired) Decreased endurance;Decreased strength;Impaired flexibility;Decreased balance;Decreased mobility;Difficulty walking;Decreased cognition;Pain;Decreased coordination;Impaired UE functional use   Rehab Potential Good   OT Frequency 4x / week   OT Duration 4 weeks   OT Treatment/Interventions Self-care/ADL training;Therapeutic exercise;Functional Mobility Training;Patient/family education;Neuromuscular education;Balance training;Therapeutic exercises;DME and/or AE instruction;Therapeutic activities;Gait Training;Stair Training   Consulted and Agree with Plan of Care Patient        Problem List Patient Active Problem List   Diagnosis Date Noted  . Parkinson disease ([place]) 01/10/2016  . DNR (do not resuscitate) 05/22/2015  . Tremor 02/07/2015  . Dysphagia, pharyngoesophageal phase 02/07/2015  . Hypercalcemia 09/14/2013  . Unspecified vitamin D deficiency 04/27/2013  . Breast cancer, left breast ([place]) 09/16/2012  . Osteoporosis, post-menopausal 03/14/2012  . AORTIC ANEURYSM 01/01/2011  . OSTEOARTHRITIS, CERVICAL SPINE 08/15/2009  . HYPERGLYCEMIA, FASTING 12/06/2008  . ALLERGIC RHINITIS 03/25/2008  . Chronic obstructive airway disease with asthma ([place]) 03/25/2008  . BREAST CANCER, HX OF 03/10/2008  . Mixed  hyperlipidemia 02/10/2007  . Essential hypertension 02/10/2007   Ladona Rosten T Tomasita Morrow, OTR/L, CLT  Harrington Jobe 01/31/2016, 5:56 PM  [place] MAIN [facility] SERVICES [address] [place], [place], [address] Phone: [phone]   Fax:  [phone]  Name: RAYMONDA PELL MRN: [id number] Date of Birth: Aug 25, 1941

## 2016-02-01 ENCOUNTER — Ambulatory Visit: Payer: Medicare Other | Admitting: Occupational Therapy

## 2016-02-01 ENCOUNTER — Encounter: Payer: Self-pay | Admitting: Occupational Therapy

## 2016-02-01 DIAGNOSIS — M6281 Muscle weakness (generalized): Secondary | ICD-10-CM

## 2016-02-01 DIAGNOSIS — R262 Difficulty in walking, not elsewhere classified: Secondary | ICD-10-CM

## 2016-02-01 DIAGNOSIS — R460 Very low level of personal hygiene: Secondary | ICD-10-CM

## 2016-02-01 DIAGNOSIS — G20 Parkinson's disease: Secondary | ICD-10-CM

## 2016-02-01 DIAGNOSIS — Z741 Need for assistance with personal care: Secondary | ICD-10-CM

## 2016-02-01 DIAGNOSIS — R278 Other lack of coordination: Secondary | ICD-10-CM

## 2016-02-01 NOTE — Therapy (Deleted)
[place] MAIN [facility] SERVICES [address] [place], [place], [address] Phone: [phone]   Fax:  [phone]  Occupational Therapy Treatment  Patient Details  Name: Crystal Bass MRN: [id number] Date of Birth: 10/07/1941 Referring Provider: Tat  Encounter Date: 01/31/2016      OT End of Session - 01/31/16 1752    Visit Number 14   Number of Visits 17   Date for OT Re-Evaluation 2016/02/25   Authorization Type G codes 14   OT Start Time 1300   OT Stop Time 1358   OT Time Calculation (min) 58 min   Activity Tolerance Patient tolerated treatment well   Behavior During Therapy [facility] for tasks assessed/performed      Past Medical History  Diagnosis Date  . Pancreatitis     secondary to Cholelithiasis  . Breast cancer ([place]) 2002    infiltrative ductal carcinoma   . Lesion of breast 1992    right, benign  . Post-thoracotomy pain syndrome   . Uterine fibroid   . Endometriosis   . Ovarian cancer ([place]) 2004  . Aneurysm of aorta ([facility])     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  . Osteoporosis, post-menopausal 03/14/2012  . Ductal carcinoma of breast, estrogen receptor positive, stage 1 ([place]) 09/16/2012  . Pseudomonas pneumonia ([place]) 5/09  . COPD (chronic obstructive pulmonary disease) ([place]) 9/08  . Mastalgia 7/95  . Lung disease     secondary to [name] infection  . Hypercalcemia 09/14/2013  . Vitamin D deficiency   . Aortic root aneurysm ([place])   . Parkinson disease ([place]) 02/10/15    Past Surgical History  Procedure Laterality Date  . Lung biopsy  2002    [name], Dr [name]  . Cardiac catheterization  2007    essentially negation for significant CAD  . Appendectomy  2004  . Cholecystectomy  2001  . 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 Breast lumpectomy  1992    benign  . Mastectomy modified radical  2002    oral chemotheraphy (tamoxifen then Armidex) no radiation, Dr.[name]  .  Tonsillectomy  1946  . Breast biopsy  08/2004    right breast-benign  . Colonoscopy  2014    Dr [name] , due 2019    There were no vitals filed for this visit.  Visit Diagnosis:  Parkinson disease ([place])  Difficulty walking  Self-care deficit for hygiene  Muscle weakness (generalized)  Other lack of coordination      Subjective Assessment - 01/31/16 1400    Subjective  Patient reports she was running a little late due to hair appt.  Ready to work, feels she is ready for discharge later this week.     Patient Stated Goals Patient reports her goal is to get back to her previous baseline if possible, be independent, improve her grip, transfers, walking, and strength.     Currently in Pain? No/denies   Pain Score 0-No pain   Multiple Pain Sites No                      OT Treatments/Exercises (OP) - 01/31/16 1400    ADLs   ADL Comments Functional component tasks this date performed as follows: sit to stand, crossing legs to put on socks, hands reaching to the back of head for 15 secs each for hair care, stair negotiation, opening of containers. Handwriting for  name, address and phone number for multiple trials performed this date. Manipulation of small 1/2 inch sized objects with both right and left hands.   Neurological Re-education Exercises   Other Exercises 1 Patient seen for initial instruction of LSVT BIG exercises: LSVT Daily Session Maximal Daily Exercises: Sustained movements are designed to rescale the amplitude of movement output for generalization to daily functional activities. Performed as follows for 1 set of 10 repetitions each: Multi directional sustained movements- 1) Floor to ceiling, 2) Side to side. Multi directional Repetitive movements performed in standing and are designed to provide retraining effort needed for sustained muscle activation in tasks Performed as follows: 3) Step and reach forward, 4) Step and Reach Backwards, 5) Step and reach  sideways, 6) Rock and reach forward/backward, 7) Rock and reach sideways. Sit to stand from mat table on lowest setting with cues for weight shift, technique for 10 reps. Patient seen for functional mobility tasks this date with emphasis on gait speed, length of steps with short distance ambulation. Patient requires cues for BIG movements and cadence.   Other Exercises 2 Patient seen for functional mobility tasks this date with emphasis on amplitude of gait and reciprocal arm swing, 1400 feet this date. Rest breaks as needed, increased shortness of breath, O2 monitored and ranges between 92-100%.                OT Education - 01/31/16 1751    Education provided Yes   Education Details HEP, maximal daily exercises, functional component tasks.   Person(s) Educated Patient   Methods Explanation;Demonstration;Verbal cues   Comprehension Verbal cues required;Returned demonstration;Verbalized understanding             OT Long Term Goals - 01/24/16 1558    OT LONG TERM GOAL #1   Title Patient will improve gait speed and endurance and be able to walk 1600 feet in 6 minutes to negotiate around the home and community safely in 4 weeks.   Baseline 1430 feet at evaluation 1440 at 10th visit   Period Days   Status On-going   OT LONG TERM GOAL #2   Title Patient will complete HEP for maximal daily exercises with modified independence in 4 weeks   Time 4   Period Weeks   Status On-going   OT LONG TERM GOAL #3   Title Patient will transfer from sit to stand without the use of arms safely and independently from a variety of chairs/surfaces in 4 weeks.    Baseline difficulty from lower surfaces, 5 times sit to stand at eval 10 secs, 10th visit 9 secs.   Time 4   Period Weeks   Status On-going   OT LONG TERM GOAL #4   Title Patient will demonstrate 100% legibility with handwriting for name and address with use of normal sized letters   Time 4   Period Weeks   Status Partially Met    OT LONG TERM GOAL #5   Title Patient will complete hair care with modified independence, demonstrating reaching to top of head for 1-2 minutes at a time to complete task.   Time 4   Period Weeks   Status On-going               Plan - 01/31/16 1800    Clinical Impression Statement Patient limited at times with shortness of breath however, O2 sats remain above 90% during all tasks.  She is able to determine when it is time to take a short  break.  She is demonstrating carryover of self awareness when she does not perform an exercise correctly and will stop, readjust and complete exercise with correct form.  Decreased cues required and patient usually able to self correct if given time to process.  Will reassess patient on Thursday and anticipate discharge.     Pt will benefit from skilled therapeutic intervention in order to improve on the following deficits (Retired) Decreased endurance;Decreased strength;Impaired flexibility;Decreased balance;Decreased mobility;Difficulty walking;Decreased cognition;Pain;Decreased coordination;Impaired UE functional use   Rehab Potential Good   OT Frequency 4x / week   OT Duration 4 weeks   OT Treatment/Interventions Self-care/ADL training;Therapeutic exercise;Functional Mobility Training;Patient/family education;Neuromuscular education;Balance training;Therapeutic exercises;DME and/or AE instruction;Therapeutic activities;Gait Training;Stair Training   Consulted and Agree with Plan of Care Patient        Problem List Patient Active Problem List   Diagnosis Date Noted  . Parkinson disease ([place]) 01/10/2016  . DNR (do not resuscitate) 05/22/2015  . Tremor 02/07/2015  . Dysphagia, pharyngoesophageal phase 02/07/2015  . Hypercalcemia 09/14/2013  . Unspecified vitamin D deficiency 04/27/2013  . Breast cancer, left breast ([place]) 09/16/2012  . Osteoporosis, post-menopausal 03/14/2012  . AORTIC ANEURYSM 01/01/2011  . OSTEOARTHRITIS, CERVICAL SPINE  08/15/2009  . HYPERGLYCEMIA, FASTING 12/06/2008  . ALLERGIC RHINITIS 03/25/2008  . Chronic obstructive airway disease with asthma ([place]) 03/25/2008  . BREAST CANCER, HX OF 03/10/2008  . Mixed hyperlipidemia 02/10/2007  . Essential hypertension 02/10/2007   Amy T Tomasita Morrow, OTR/L, CLT  Lovett,Amy 02/01/2016, 1:24 PM  [place] MAIN [facility] SERVICES [address] [place], [place], [address] Phone: [phone]   Fax:  [phone]  Name: Crystal Bass MRN: [id number] Date of Birth: Nov 24, 1941

## 2016-02-02 ENCOUNTER — Ambulatory Visit: Payer: Medicare Other | Admitting: Occupational Therapy

## 2016-02-02 DIAGNOSIS — Z741 Need for assistance with personal care: Secondary | ICD-10-CM

## 2016-02-02 DIAGNOSIS — R460 Very low level of personal hygiene: Secondary | ICD-10-CM

## 2016-02-02 DIAGNOSIS — G20 Parkinson's disease: Secondary | ICD-10-CM

## 2016-02-02 DIAGNOSIS — R262 Difficulty in walking, not elsewhere classified: Secondary | ICD-10-CM

## 2016-02-02 DIAGNOSIS — M6281 Muscle weakness (generalized): Secondary | ICD-10-CM

## 2016-02-02 NOTE — Therapy (Signed)
[place] MAIN [facility] SERVICES [address] [place], [place], [address] Phone: [phone]   Fax:  [phone]  Occupational Therapy Treatment  Patient Details  Name: Crystal Bass MRN: [id number] Date of Birth: 1941-09-02 Referring Provider: Tat  Encounter Date: 02/01/2016      OT End of Session - 02/02/16 2035    Visit Number 16   Number of Visits 17   Date for OT Re-Evaluation 02-05-2016   Authorization Type G codes 16   OT Start Time 1300   OT Stop Time 1355   OT Time Calculation (min) 55 min   Activity Tolerance Patient tolerated treatment well   Behavior During Therapy [facility] for tasks assessed/performed      Past Medical History  Diagnosis Date  . Pancreatitis     secondary to Cholelithiasis  . Breast cancer ([place]) 2002    infiltrative ductal carcinoma   . Lesion of breast 1992    right, benign  . Post-thoracotomy pain syndrome   . Uterine fibroid   . Endometriosis   . Ovarian cancer ([place]) 2004  . Aneurysm of aorta ([facility])     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  . Osteoporosis, post-menopausal 03/14/2012  . Ductal carcinoma of breast, estrogen receptor positive, stage 1 ([place]) 09/16/2012  . Pseudomonas pneumonia ([place]) 5/09  . COPD (chronic obstructive pulmonary disease) ([place]) 9/08  . Mastalgia 7/95  . Lung disease     secondary to [name] infection  . Hypercalcemia 09/14/2013  . Vitamin D deficiency   . Aortic root aneurysm ([place])   . Parkinson disease ([place]) 02/10/15    Past Surgical History  Procedure Laterality Date  . Lung biopsy  2002    [name], Dr [name]  . Cardiac catheterization  2007    essentially negation for significant CAD  . Appendectomy  2004  . Cholecystectomy  2001  . 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 Breast lumpectomy  1992    benign  . Mastectomy modified radical  2002    oral chemotheraphy (tamoxifen then Armidex) no radiation, Dr.[name]  .  Tonsillectomy  1946  . Breast biopsy  08/2004    right breast-benign  . Colonoscopy  2014    Dr [name] , due 2019    There were no vitals filed for this visit.  Visit Diagnosis:  Parkinson disease ([place])  Difficulty walking  Self-care deficit for hygiene  Muscle weakness (generalized)  Other lack of coordination      Subjective Assessment - 02/01/16 1631    Subjective  Patient reports she feels she is able to perform her exercises at home, using handouts as needed.  Ready for discharge but is curious to know the results of her outcome measures next session.   Patient Stated Goals Patient reports her goal is to get back to her previous baseline if possible, be independent, improve her grip, transfers, walking, and strength.     Currently in Pain? No/denies   Pain Score 0-No pain                      OT Treatments/Exercises (OP) - 02/02/16 1632    ADLs   ADL Comments Functional component tasks this date performed as follows: sit to stand, crossing legs to put on socks, hands reaching to the back of head for 15 secs each for hair care, stair negotiation, opening of containers. Handwriting  for name, address and phone number for multiple trials performed this date. Manipulation of small 1/2 inch sized objects with both right and left hands.   Neurological Re-education Exercises   Other Exercises 1 Patient seen for initial instruction of LSVT BIG exercises: LSVT Daily Session Maximal Daily Exercises: Sustained movements are designed to rescale the amplitude of movement output for generalization to daily functional activities. Performed as follows for 1 set of 10 repetitions each: Multi directional sustained movements- 1) Floor to ceiling, 2) Side to side. Multi directional Repetitive movements performed in standing and are designed to provide retraining effort needed for sustained muscle activation in tasks Performed as follows: 3) Step and reach forward, 4) Step and Reach  Backwards, 5) Step and reach sideways, 6) Rock and reach forward/backward, 7) Rock and reach sideways. Sit to stand from mat table on lowest setting with cues for weight shift, technique for 10 reps. Patient seen for functional mobility tasks this date with emphasis on gait speed, length of steps with short distance ambulation.  Patient was able to complete tasks this date without cues or modeling from therapist.  She did make a couple of mistakes but was able to recognize immediately and make corrections.     Other Exercises 2 Patient seen for functional mobility tasks this date with emphasis on amplitude of gait and reciprocal arm swing, 1400 feet this date. Rest breaks as needed, increased shortness of breath, O2 monitored and ranges between 94-100%.                OT Education - 02/02/16 1635    Education provided Yes   Education Details Plan for discharge, exercise.   Person(s) Educated Patient   Methods Explanation;Demonstration   Comprehension Returned demonstration;Verbalized understanding             OT Long Term Goals - 01/24/16 1558    OT LONG TERM GOAL #1   Title Patient will improve gait speed and endurance and be able to walk 1600 feet in 6 minutes to negotiate around the home and community safely in 4 weeks.   Baseline 1430 feet at evaluation 1440 at 10th visit   Period Days   Status On-going   OT LONG TERM GOAL #2   Title Patient will complete HEP for maximal daily exercises with modified independence in 4 weeks   Time 4   Period Weeks   Status On-going   OT LONG TERM GOAL #3   Title Patient will transfer from sit to stand without the use of arms safely and independently from a variety of chairs/surfaces in 4 weeks.    Baseline difficulty from lower surfaces, 5 times sit to stand at eval 10 secs, 10th visit 9 secs.   Time 4   Period Weeks   Status On-going   OT LONG TERM GOAL #4   Title Patient will demonstrate 100% legibility with handwriting for name  and address with use of normal sized letters   Time 4   Period Weeks   Status Partially Met   OT LONG TERM GOAL #5   Title Patient will complete hair care with modified independence, demonstrating reaching to top of head for 1-2 minutes at a time to complete task.   Time 4   Period Weeks   Status On-going               Plan - 02/02/16 2036    Clinical Impression Statement Patient able to complete home program without cues this date  and no therapist modeling.  She is able to self correct when needed and aware of when she needs to take a rest break. Will plan to discharge next date with completion of LSVT BIG program, will reassess outcome measures and will complete daily exercises with use of homework helper DVD with instructions on how to advance exercises in the future.    Pt will benefit from skilled therapeutic intervention in order to improve on the following deficits (Retired) Decreased endurance;Decreased strength;Impaired flexibility;Decreased balance;Decreased mobility;Difficulty walking;Decreased cognition;Pain;Decreased coordination;Impaired UE functional use   Rehab Potential Good   OT Frequency 4x / week   OT Duration 4 weeks   OT Treatment/Interventions Self-care/ADL training;Therapeutic exercise;Functional Mobility Training;Patient/family education;Neuromuscular education;Balance training;Therapeutic exercises;DME and/or AE instruction;Therapeutic activities;Gait Training;Stair Training   Consulted and Agree with Plan of Care Patient        Problem List Patient Active Problem List   Diagnosis Date Noted  . Parkinson disease ([place]) 01/10/2016  . DNR (do not resuscitate) 05/22/2015  . Tremor 02/07/2015  . Dysphagia, pharyngoesophageal phase 02/07/2015  . Hypercalcemia 09/14/2013  . Unspecified vitamin D deficiency 04/27/2013  . Breast cancer, left breast ([place]) 09/16/2012  . Osteoporosis, post-menopausal 03/14/2012  . AORTIC ANEURYSM 01/01/2011  . OSTEOARTHRITIS,  CERVICAL SPINE 08/15/2009  . HYPERGLYCEMIA, FASTING 12/06/2008  . ALLERGIC RHINITIS 03/25/2008  . Chronic obstructive airway disease with asthma ([place]) 03/25/2008  . BREAST CANCER, HX OF 03/10/2008  . Mixed hyperlipidemia 02/10/2007  . Essential hypertension 02/10/2007   Amy T Tomasita Morrow, OTR/L, CLT  Lovett,Amy 02/02/2016, 8:40 PM  [place] MAIN [facility] SERVICES [address] [place], [place], [address] Phone: [phone]   Fax:  [phone]  Name: Crystal Bass MRN: [id number] Date of Birth: 06/03/1941

## 2016-02-02 NOTE — Therapy (Signed)
[place] MAIN [facility] SERVICES [address] [place], [place], [address] Phone: [phone]   Fax:  [phone]  Occupational Therapy Treatment  Patient Details  Name: Crystal Bass MRN: [id number] Date of Birth: 06-19-1941 Referring Provider: Tat  Encounter Date: 01/31/2016      OT End of Session - 02/01/16 1300    Visit Number 15   Number of Visits 17   Date for OT Re-Evaluation 2016-02-11   Authorization Type G codes 15   Activity Tolerance Patient tolerated treatment well   Behavior During Therapy [facility] for tasks assessed/performed      Past Medical History  Diagnosis Date  . Pancreatitis     secondary to Cholelithiasis  . Breast cancer ([place]) 2002    infiltrative ductal carcinoma   . Lesion of breast 1992    right, benign  . Post-thoracotomy pain syndrome   . Uterine fibroid   . Endometriosis   . Ovarian cancer ([place]) 2004  . Aneurysm of aorta ([facility])     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  . Osteoporosis, post-menopausal 03/14/2012  . Ductal carcinoma of breast, estrogen receptor positive, stage 1 ([place]) 09/16/2012  . Pseudomonas pneumonia ([place]) 5/09  . COPD (chronic obstructive pulmonary disease) ([place]) 9/08  . Mastalgia 7/95  . Lung disease     secondary to [name] infection  . Hypercalcemia 09/14/2013  . Vitamin D deficiency   . Aortic root aneurysm ([place])   . Parkinson disease ([place]) 02/10/15    Past Surgical History  Procedure Laterality Date  . Lung biopsy  2002    [name], Dr [name]  . Cardiac catheterization  2007    essentially negation for significant CAD  . Appendectomy  2004  . Cholecystectomy  2001  . 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 Breast lumpectomy  1992    benign  . Mastectomy modified radical  2002    oral chemotheraphy (tamoxifen then Armidex) no radiation, Dr.[name]  . Tonsillectomy  1946  . Breast biopsy  08/2004    right breast-benign  .  Colonoscopy  2014    Dr [name] , due 2019    There were no vitals filed for this visit.  Visit Diagnosis:  Parkinson disease ([place])  Difficulty walking  Self-care deficit for hygiene  Muscle weakness (generalized)  Other lack of coordination      Subjective Assessment - 02/01/16 1631    Subjective  Patient reports she feels she is able to perform her exercises at home, using handouts as needed.  Ready for discharge but is curious to know the results of her outcome measures next session.   Patient Stated Goals Patient reports her goal is to get back to her previous baseline if possible, be independent, improve her grip, transfers, walking, and strength.     Currently in Pain? No/denies   Pain Score 0-No pain                      OT Treatments/Exercises (OP) - 02/02/16 1632    ADLs   ADL Comments Functional component tasks this date performed as follows: sit to stand, crossing legs to put on socks, hands reaching to the back of head for 15 secs each for hair care, stair negotiation, opening of containers. Handwriting for name, address and phone number for multiple trials performed this date. Manipulation of small 1/2 inch sized objects with  both right and left hands.   Neurological Re-education Exercises   Other Exercises 1 Patient seen for initial instruction of LSVT BIG exercises: LSVT Daily Session Maximal Daily Exercises: Sustained movements are designed to rescale the amplitude of movement output for generalization to daily functional activities. Performed as follows for 1 set of 10 repetitions each: Multi directional sustained movements- 1) Floor to ceiling, 2) Side to side. Multi directional Repetitive movements performed in standing and are designed to provide retraining effort needed for sustained muscle activation in tasks Performed as follows: 3) Step and reach forward, 4) Step and Reach Backwards, 5) Step and reach sideways, 6) Rock and reach forward/backward, 7)  Rock and reach sideways. Sit to stand from mat table on lowest setting with cues for weight shift, technique for 10 reps. Patient seen for functional mobility tasks this date with emphasis on gait speed, length of steps with short distance ambulation.  Patient was able to complete tasks this date without cues or modeling from therapist.  She did make a couple of mistakes but was able to recognize immediately and make corrections.     Other Exercises 2 Patient seen for functional mobility tasks this date with emphasis on amplitude of gait and reciprocal arm swing, 1400 feet this date. Rest breaks as needed, increased shortness of breath, O2 monitored and ranges between 94-100%.                OT Education - 02/02/16 1635    Education provided Yes   Education Details Plan for discharge, exercise.   Person(s) Educated Patient   Methods Explanation;Demonstration   Comprehension Returned demonstration;Verbalized understanding             OT Long Term Goals - 01/24/16 1558    OT LONG TERM GOAL #1   Title Patient will improve gait speed and endurance and be able to walk 1600 feet in 6 minutes to negotiate around the home and community safely in 4 weeks.   Baseline 1430 feet at evaluation 1440 at 10th visit   Period Days   Status On-going   OT LONG TERM GOAL #2   Title Patient will complete HEP for maximal daily exercises with modified independence in 4 weeks   Time 4   Period Weeks   Status On-going   OT LONG TERM GOAL #3   Title Patient will transfer from sit to stand without the use of arms safely and independently from a variety of chairs/surfaces in 4 weeks.    Baseline difficulty from lower surfaces, 5 times sit to stand at eval 10 secs, 10th visit 9 secs.   Time 4   Period Weeks   Status On-going   OT LONG TERM GOAL #4   Title Patient will demonstrate 100% legibility with handwriting for name and address with use of normal sized letters   Time 4   Period Weeks    Status Partially Met   OT LONG TERM GOAL #5   Title Patient will complete hair care with modified independence, demonstrating reaching to top of head for 1-2 minutes at a time to complete task.   Time 4   Period Weeks   Status On-going               Plan - 02/01/16 1300    Clinical Impression Statement Continues to progress in all areas, limited at times with breathing but does well with rest breaks and is now able to recognize when she is performing exercises correctly.  Will instruct patient on progressions of exercises prior to discharge for the future.   Pt will benefit from skilled therapeutic intervention in order to improve on the following deficits (Retired) Decreased endurance;Decreased strength;Impaired flexibility;Decreased balance;Decreased mobility;Difficulty walking;Decreased cognition;Pain;Decreased coordination;Impaired UE functional use   Rehab Potential Good   OT Frequency 4x / week   OT Duration 4 weeks   OT Treatment/Interventions Self-care/ADL training;Therapeutic exercise;Functional Mobility Training;Patient/family education;Neuromuscular education;Balance training;Therapeutic exercises;DME and/or AE instruction;Therapeutic activities;Gait Training;Stair Training   Consulted and Agree with Plan of Care Patient        Problem List Patient Active Problem List   Diagnosis Date Noted  . Parkinson disease ([place]) 01/10/2016  . DNR (do not resuscitate) 05/22/2015  . Tremor 02/07/2015  . Dysphagia, pharyngoesophageal phase 02/07/2015  . Hypercalcemia 09/14/2013  . Unspecified vitamin D deficiency 04/27/2013  . Breast cancer, left breast ([place]) 09/16/2012  . Osteoporosis, post-menopausal 03/14/2012  . AORTIC ANEURYSM 01/01/2011  . OSTEOARTHRITIS, CERVICAL SPINE 08/15/2009  . HYPERGLYCEMIA, FASTING 12/06/2008  . ALLERGIC RHINITIS 03/25/2008  . Chronic obstructive airway disease with asthma ([place]) 03/25/2008  . BREAST CANCER, HX OF 03/10/2008  . Mixed  hyperlipidemia 02/10/2007  . Essential hypertension 02/10/2007   Amy T Tomasita Morrow, OTR/L, CLT  Lovett,Amy 02/02/2016, 4:57 PM  [place] MAIN [facility] SERVICES [address] [place], [place], [address] Phone: [phone]   Fax:  [phone]  Name: Crystal Bass MRN: [id number] Date of Birth: Jul 13, 1941

## 2016-02-03 ENCOUNTER — Encounter: Payer: Self-pay | Admitting: Occupational Therapy

## 2016-02-03 NOTE — Therapy (Signed)
[place] MAIN [facility] SERVICES [place], [place], [address] Phone: [phone]   Fax:  [phone]  Occupational Therapy Treatment/Discharge Summary  Patient Details  Name: Crystal Bass MRN: [id number] Date of Birth: 1941/06/27 Referring Provider: Tat  Encounter Date: 02/02/2016      OT End of Session - 02-27-2016 1002    Visit Number 17   Number of Visits 17   Date for OT Re-Evaluation 27-Feb-2016   Authorization Type G codes 46   OT Start Time 1300   OT Stop Time 1402   OT Time Calculation (min) 62 min   Activity Tolerance Patient tolerated treatment well   Behavior During Therapy [facility] for tasks assessed/performed      Past Medical History  Diagnosis Date  . Pancreatitis     secondary to Cholelithiasis  . Breast cancer ([place]) 2002    infiltrative ductal carcinoma   . Lesion of breast 1992    right, benign  . Post-thoracotomy pain syndrome   . Uterine fibroid   . Endometriosis   . Ovarian cancer ([place]) 2004  . Aneurysm of aorta ([facility])     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  . Osteoporosis, post-menopausal 03/14/2012  . Ductal carcinoma of breast, estrogen receptor positive, stage 1 ([place]) 09/16/2012  . Pseudomonas pneumonia ([place]) 5/09  . COPD (chronic obstructive pulmonary disease) ([place]) 9/08  . Mastalgia 7/95  . Lung disease     secondary to [name] infection  . Hypercalcemia 09/14/2013  . Vitamin D deficiency   . Aortic root aneurysm ([place])   . Parkinson disease ([place]) 02/10/15    Past Surgical History  Procedure Laterality Date  . Lung biopsy  2002    [name], Dr [name]  . Cardiac catheterization  2007    essentially negation for significant CAD  . Appendectomy  2004  . Cholecystectomy  2001  . 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 Breast lumpectomy  1992    benign  . Mastectomy modified radical  2002    oral chemotheraphy (tamoxifen then Armidex) no radiation,  Dr.[name]  . Tonsillectomy  1946  . Breast biopsy  08/2004    right breast-benign  . Colonoscopy  2014    Dr [name] , due 2019    There were no vitals filed for this visit.  Visit Diagnosis:  Difficulty walking  Parkinson disease [facility])  Self-care deficit for hygiene  Muscle weakness (generalized)      Subjective Assessment - 02-27-2016 0950    Subjective  Patient reports she is ready for discharge, feels she has a better grasp on daily exercises now than in the past and is committed to keep doing exercises daily.  She is pleased with her progress.    Patient Stated Goals Patient reports her goal is to get back to her previous baseline if possible, be independent, improve her grip, transfers, walking, and strength.     Currently in Pain? No/denies   Pain Score 0-No pain                      OT Treatments/Exercises (OP) - 2016/02/27 7824    Neurological Re-education Exercises   Other Exercises 1 Patient seen for initial instruction of LSVT BIG exercises: LSVT Daily Session Maximal Daily Exercises: Sustained movements are designed to rescale the amplitude of movement output for generalization to daily functional activities. Performed as follows  for 1 set of 10 repetitions each: Multi directional sustained movements- 1) Floor to ceiling, 2) Side to side. Multi directional Repetitive movements performed in standing and are designed to provide retraining effort needed for sustained muscle activation in tasks Performed as follows: 3) Step and reach forward, 4) Step and Reach Backwards, 5) Step and reach sideways, 6) Rock and reach forward/backward, 7) Rock and reach sideways. Sit to stand from mat table on lowest setting with cues for weight shift, technique for 10 reps. Patient seen for functional mobility tasks this date with emphasis on gait speed, length of steps with short distance ambulation. Patient was able to complete tasks this date without cues or modeling from  therapist. Utilized the LSVT BIG homework helper this date while performing exercises to see if patient feels the need to purchase the DVD from LSVT.  She was independent in performing her exercises this date.  Patient was educated on progressions of exercises after therapy within a hierarchical framework:  alternating sides, adding hand or arm flicks, adding resistance, challenging balance and then mental challenges.   Other Exercises 2 Reassessment of skills this date:  5 times sit to stand 8 secs., 6 minute walk test, 1510 feet, BERG balance test 56/56. 9 hole peg test right 19 sec, left 21 sec.                 OT Education - 02/03/16 1001    Education provided Yes   Education Details HEP, outcome measures, progression of exercises to make them more challenging in the future.   Person(s) Educated Patient   Methods Explanation;Demonstration;Verbal cues   Comprehension Verbal cues required;Returned demonstration;Verbalized understanding             OT Long Term Goals - 02/03/16 1008    OT LONG TERM GOAL #1   Title Patient will improve gait speed and endurance and be able to walk 1600 feet in 6 minutes to negotiate around the home and community safely in 4 weeks.   Baseline 1430 feet at evaluation 1440 at 10th visit, 1510 at discharge   Time 4   Period Days   Status Partially Met   OT LONG TERM GOAL #2   Title Patient will complete HEP for maximal daily exercises with modified independence in 4 weeks   Time 4   Period Weeks   Status Achieved   OT LONG TERM GOAL #3   Title Patient will transfer from sit to stand without the use of arms safely and independently from a variety of chairs/surfaces in 4 weeks.    Time 4   Period Weeks   Status Achieved   OT LONG TERM GOAL #4   Title Patient will demonstrate 100% legibility with handwriting for name and address with use of normal sized letters   Time 4   Period Weeks   Status Achieved   OT LONG TERM GOAL #5   Title  Patient will complete hair care with modified independence, demonstrating reaching to top of head for 1-2 minutes at a time to complete task.   Time 4   Period Weeks   Status Achieved               Plan - 02/03/16 1003    Clinical Impression Statement Patient has completed intensive portion of LSVT BIG program and has partially met goals (did not quite meet distance goal for ambulation.  She is independent with her home exercise program and is able to demo understanding  of progressing exercises in the future.  She is aware she will need to continue with exercises daily on a permanent basis for best outcomes.  She improved in all her outcome measures and is pleased with her progress.    Pt will benefit from skilled therapeutic intervention in order to improve on the following deficits (Retired) Decreased endurance;Decreased strength;Impaired flexibility;Decreased balance;Decreased mobility;Difficulty walking;Decreased cognition;Pain;Decreased coordination;Impaired UE functional use   Rehab Potential Good   OT Frequency 4x / week   OT Duration 4 weeks   OT Treatment/Interventions Self-care/ADL training;Therapeutic exercise;Functional Mobility Training;Patient/family education;Neuromuscular education;Balance training;Therapeutic exercises;DME and/or AE instruction;Therapeutic activities;Gait Training;Stair Training   Consulted and Agree with Plan of Care Patient          G-Codes - February 23, 2016 1430    Functional Assessment Tool Used 5 times sit to stand, 6 minute walk test, self care assessment, clinical judgment, BERG balance, 9 hole peg test   Functional Limitation Self care   Self Care Goal Status (Z0017) 0 percent impaired, limited or restricted   Self Care Discharge Status [phone]) At least 1 percent but less than 20 percent impaired, limited or restricted      Problem List Patient Active Problem List   Diagnosis Date Noted  . Parkinson disease ([place]) 01/10/2016  . DNR (do not  resuscitate) 05/22/2015  . Tremor 02/07/2015  . Dysphagia, pharyngoesophageal phase 02/07/2015  . Hypercalcemia 09/14/2013  . Unspecified vitamin D deficiency 04/27/2013  . Breast cancer, left breast ([place]) 09/16/2012  . Osteoporosis, post-menopausal 03/14/2012  . AORTIC ANEURYSM 01/01/2011  . OSTEOARTHRITIS, CERVICAL SPINE 08/15/2009  . HYPERGLYCEMIA, FASTING 12/06/2008  . ALLERGIC RHINITIS 03/25/2008  . Chronic obstructive airway disease with asthma ([place]) 03/25/2008  . BREAST CANCER, HX OF 03/10/2008  . Mixed hyperlipidemia 02/10/2007  . Essential hypertension 02/10/2007   Amy T Tomasita Morrow, OTR/L, CLT  Lovett,Amy 02/03/2016, 10:09 AM  [place] MAIN [facility] SERVICES [place], [place], [address] Phone: [phone]   Fax:  [phone]  Name: MATAYA KILDUFF MRN: [id number] Date of Birth: Apr 08, 1941

## 2016-02-08 ENCOUNTER — Ambulatory Visit
Admission: RE | Admit: 2016-02-08 | Discharge: 2016-02-08 | Disposition: A | Discharge status: Home / Self Care | Payer: Medicare Other | Source: Ambulatory Visit | Attending: Family Medicine | Admitting: Family Medicine

## 2016-02-08 DIAGNOSIS — M810 Age-related osteoporosis without current pathological fracture: Secondary | ICD-10-CM

## 2016-02-17 ENCOUNTER — Telehealth: Payer: Self-pay

## 2016-02-17 NOTE — Telephone Encounter (Signed)
Spoke with patient and she is aware of her new time on 4/26

## 2016-02-25 ENCOUNTER — Other Ambulatory Visit: Payer: Self-pay | Admitting: Neurology

## 2016-02-27 NOTE — Telephone Encounter (Signed)
Carbidopa Levodopa refill requested. Per last office note- patient to remain on medication. Refill approved and sent to patient's pharmacy.   

## 2016-03-01 ENCOUNTER — Ambulatory Visit (INDEPENDENT_AMBULATORY_CARE_PROVIDER_SITE_OTHER): Payer: Medicare Other | Admitting: Neurology

## 2016-03-01 ENCOUNTER — Encounter: Payer: Self-pay | Admitting: Neurology

## 2016-03-01 VITALS — BP 90/58 | HR 66 | Ht 62.5 in | Wt 156.0 lb

## 2016-03-01 DIAGNOSIS — G20A1 Parkinson's disease without dyskinesia, without mention of fluctuations: Secondary | ICD-10-CM

## 2016-03-01 DIAGNOSIS — G43009 Migraine without aura, not intractable, without status migrainosus: Secondary | ICD-10-CM

## 2016-03-01 DIAGNOSIS — G20 Parkinson's disease: Secondary | ICD-10-CM | POA: Diagnosis not present

## 2016-03-01 DIAGNOSIS — G3184 Mild cognitive impairment, so stated: Secondary | ICD-10-CM | POA: Diagnosis not present

## 2016-03-01 NOTE — Progress Notes (Signed)
Crystal Bass was seen today in the movement disorders clinic for neurologic consultation at the request of [name], MD.  The consultation is for the evaluation of tremor.  Pt states that it has been going on over a year.  It is mostly in the R hand and rarely in the L hand.  "It is almost constant but not quite."  She notices it when grasping the steering wheel.  Eating was troublesome but better now that d/c the symbicort.  Symbicort worsened tremor by 80%.  Paternal aunt with tremor.  05/17/15 update:  Last visit, I diagnosed the patient with Parkinson's disease and started her on carbidopa/levodopa 25/100, one tablet 3 times per day.  She states that for the first 6 weeks she had diarrhea but it got better and it went away.  She does think that the tremor is better but does know that stress will trigger the tremor.  She also states that speech therapy seems to set off tremor.  She was started in physical therapy at [facility].  She has noted that she is waking up from dreams yelling and acting out dreams.  She does have a long history of migraine headaches and last visit had wanted me to prescribe Imitrex.  I was very nervous that her age and did not feel that she was likely a triptan candidate.  I ended up ordering an MRI of the brain on 02/28/2015.  I reviewed this and there was evidence of moderate small vessel disease.  I subsequently told her that she was not a candidate for the triptans.  She also had an MRI of the cervical spine on a performed, 2016.  There was mild multilevel cervical spondylosis.  Unfortunately, she has had several headaches since last visit that were fairly severe.  She did see Dr. [name] since last visit in regards to dysphagia and she states that it was scheduled but she ended up having to reschedule it.  08/23/15 update:  The patient is following up today.  She remains on carbidopa/levodopa 25/100, one tablet 3 times per day (8:30am/2pm/10PM).  She does have some  diarrhea that she associates with taking the pill but feels it is better than when she first started taking it.  She also states that it takes her until mid afternoon to feel normal and just feels slow in the morning.  She was started on clonazepam last visit for REM behavior disorder and that has helped dramatically.  She has had some spells of weakness in which her legs give out.  It may happen a few times a week.  She does not relate that to the addition of clonazepam at all.  No falls.  No hallucinations.  She was exercising until her back started hurting again.   In regards to her migraines, I gave her a prescription for Cambia last visit.  She is not a candidate for the triptans any longer.  She picked it up but hasn't had to use it yet.  She is on neurontin 600 mg bid for post surgical pain (states that she had mastectomy and open lung biopsy and then had terrible paresthesias).  Is still like that if doesn't take the neurontin, especially if she has a bra on.  She does complain that she thinks she is not thinking as clear as she used to.  11/28/14 update:  The patient is following up today.  Last visit, I increased her carbidopa/levodopa so that she is taking carbidopa/levodopa 25/100, 2  tablets in the morning, one in the afternoon and one in the evening.  I was hoping that this would help her morning "on."  Today, the patient states that she just has no energy and she is unsure if it is related to the medication; it started before the medication increase.  She thinks that the energy does get a bit better throughout the day.  She is on the klonopin and "I don't want to give that up as I don't dream anymore."  She has so little energy that she has not exercised much.  Does want to go back to PT at [facility].  She is not snoring at night that she knows of.  She's had no falls since last visit.  No hallucinations.  No lightheadedness or near syncope.  She has a has a history of rare migraine.  She does have  Cambia at home but has not had to use that.  She had a "bad" headache last night but got rid of it with tylenol, aleve, and coke.  Also c/o issues with BM's - both constipation and diarrhea.  03/01/16 update: The patient is following up today regarding her Parkinson's disease.  I have had the opportunity to review her prior records made available to me.  I last visit, she was complaining about loss of energy.  It was not sure that this was really related to her medications, but we decided to strategically hold medications.  She held her levodopa for about 4 days, because she thought that that was the culprit.  She felt no different in terms of energy off of the medication and ended up going back on the carbidopa/levodopa 25/100, but for some reason she went from 2 in the morning, one in the afternoon and one in the evening to 1 tablet three times a day (states that she didn't think that the higher dose made a difference).  We subsequently held her clonazepam and her energy may have been a little bit better, but she felt that it was certainly not worth being off of the medication, because she has terrible dreams and acting out the dreams off of the medications.  We decided subsequently to that selegiline for its amphetamine-like properties, but she decided not to take the medication after it was prescribed.  She reported that she wanted to try to increase her energy through exercise.  She is doing the BIG/LOUD exercises at home and walks some.  Brings in a [place] form for exercise today.  No hallucination but rare visual distortion.  She has had some headaches but hasn't tried the cambia.     PREVIOUS MEDICATIONS: none to date  ALLERGIES:   Allergies  Allergen Reactions  . Sulfonamide Derivatives Anaphylaxis  . Topamax [Topiramate] Other (See Comments)    Metabolic acidosis   . Biaxin [Clarithromycin] Other (See Comments)    pericarditis  . Hydrocodone Other (See Comments)    "hyper and climbing the  walls"  . Motrin [Ibuprofen] Other (See Comments)    headaches  . Symbicort [Budesonide-Formoterol Fumarate]     02/07/15 tremor  . Percocet [Oxycodone-Acetaminophen] Itching and Rash  . Tape Itching and Rash    Use paper tape only  . Tetracyclines & Related Other (See Comments)    "immediate yeast infection"    CURRENT MEDICATIONS:  Outpatient Encounter Prescriptions as of 03/01/2016  Medication Sig  . acetaminophen (TYLENOL) 325 MG tablet Take 1,000 mg by mouth 2 (two) times daily as needed.   [name]  aspirin 81 MG tablet Take 81 mg by mouth daily.    . carbidopa-levodopa ([name]) 25-100 MG tablet TAKE 2 TABLETS IN THE MORNING, 1 TABLET IN THE AFTERNOON, 1 IN THE EVENING (Patient taking differently: 1 tablet 3 times daily)  . clonazePAM (KLONOPIN) 0.5 MG tablet Take 0.5-1 tablets (0.25-0.5 mg total) by mouth at bedtime. Take 1/2 tablet nightly for dreams  . gabapentin ([name]) 300 MG capsule TAKE 1-3 CAPSULES (300-900 MG TOTAL) BY MOUTH 3 (THREE) TIMES DAILY. (Patient taking differently: 2 tablets in the morning, 1 in the afternoon, 2 in the evening)  . losartan (COZAAR) 50 MG tablet TAKE 1 TABLET EVERY DAY  . metoprolol tartrate (LOPRESSOR) 25 MG tablet 1 by mouth twice a day  . Vitamin D, Ergocalciferol, ([name]) 50000 UNITS CAPS capsule TAKE 1 CAPSULE BY MOUTH EVERY WEEK  . zolendronic acid (ZOMETA) 4 MG/5ML injection Inject 5 mLs (4 mg total) into the vein every 6 (six) months.  . [DISCONTINUED] selegiline (ELDEPRYL) 5 MG tablet Take 1 tablet (5 mg total) by mouth 2 (two) times daily with a meal. (Patient not taking: Reported on 01/03/2016)   No facility-administered encounter medications on file as of 03/01/2016.    PAST MEDICAL HISTORY:   Past Medical History  Diagnosis Date  . Pancreatitis     secondary to Cholelithiasis  . Breast cancer ([place]) 2002    infiltrative ductal carcinoma   . Lesion of breast 1992    right, benign  . Post-thoracotomy pain syndrome   . Uterine  fibroid   . Endometriosis   . Ovarian cancer ([place]) 2004  . Aneurysm of aorta ([facility])     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  . Osteoporosis, post-menopausal 03/14/2012  . Ductal carcinoma of breast, estrogen receptor positive, stage 1 ([place]) 09/16/2012  . Pseudomonas pneumonia ([place]) 5/09  . COPD (chronic obstructive pulmonary disease) ([place]) 9/08  . Mastalgia 7/95  . Lung disease     secondary to [name] infection  . Hypercalcemia 09/14/2013  . Vitamin D deficiency   . Aortic root aneurysm ([place])   . Parkinson disease ([place]) 02/10/15    PAST SURGICAL HISTORY:   Past Surgical History  Procedure Laterality Date  . Lung biopsy  2002    [name], Dr [name]  . Cardiac catheterization  2007    essentially negation for significant CAD  . Appendectomy  2004  . Cholecystectomy  2001  . 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 Breast lumpectomy  1992    benign  . Mastectomy modified radical  2002    oral chemotheraphy (tamoxifen then Armidex) no radiation, Dr.[name]  . Tonsillectomy  1946  . Breast biopsy  08/2004    right breast-benign  . Colonoscopy  2014    Dr [name] , due 2019    SOCIAL HISTORY:   Social History   Social History  . Marital Status: Widowed    Spouse Name: N/A  . Number of Children: 0  . Years of Education: N/A   Occupational History  . 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 Smoking status: Never Smoker   . Smokeless tobacco: Never Used  . Alcohol Use: No  . Drug Use: No  . Sexual Activity: No     Comment: TAH/BSO   Other Topics Concern  . 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 Father Deceased     lung cancer  . Sister Alive     colon/breast cancer  . Mother Deceased     emphysema  . Brother Alive     healthy    ROS:  A  complete 10 system review of systems was obtained and was unremarkable apart from what is mentioned above.  PHYSICAL EXAMINATION:    VITALS:   Filed Vitals:   03/01/16 1046  BP: 90/58  Pulse: 66  Height: 5' 2.5" (1.588 m)  Weight: 156 lb (70.761 kg)    GEN:  The patient appears stated age and is in NAD. HEENT:  Normocephalic, atraumatic.  The mucous membranes are moist. The superficial temporal arteries are without ropiness or tenderness. CV:  RRR Lungs:  CTAB Neck/HEME:  There are no carotid bruits bilaterally.  Neurological examination:  Orientation: The patient is alert and oriented x3. Fund of knowledge is appropriate.  Recent and remote memory are intact.  Attention and concentration are normal.    Able to name objects and repeat phrases. Cranial nerves: There is good facial symmetry with the exception of the fact that when asked to raise the eyebrows, the right eyebrow raises further than the left.  There is mild facial hypomimia. The speech is fluent and clear. Soft palate rises symmetrically and there is no tongue deviation. Hearing is intact to conversational tone. Sensation: Sensation is intact to light touch throughout. Motor: Strength is 5/5 in the bilateral upper and lower extremities.   Shoulder shrug is equal and symmetric.  There is no pronator drift.   Movement examination: Tone: There is mod rigidity in the RUE Abnormal movements: There is a rare and intermittent right upper extremity resting tremor. Coordination:  There is good rapid alternating movements today. Gait and Station: The patient has no difficulty arising out of a deep-seated chair without the use of the hands. The patient's stride length is normal, and good (but purposeful) arm swing  LABS  Lab Results  Component Value Date   WBC 6.5 01/10/2016   HGB 13.4 01/10/2016   HCT 40.3 01/10/2016   MCV 93.1 01/10/2016   PLT 207.0 01/10/2016     Chemistry      Component Value Date/Time   NA 139  01/10/2016 1134   NA 140 09/27/2015 1133   K 3.8 01/10/2016 1134   K 4.2 09/27/2015 1133   CL 102 01/10/2016 1134   CL 102 03/10/2013 1529   CO2 30 01/10/2016 1134   CO2 26 09/27/2015 1133   BUN 14 01/10/2016 1134   BUN 19.7 09/27/2015 1133   CREATININE 0.76 01/10/2016 1134   CREATININE 0.8 09/27/2015 1133      Component Value Date/Time   CALCIUM 9.7 01/10/2016 1134   CALCIUM 9.3 09/27/2015 1133   ALKPHOS 68 01/10/2016 1134   ALKPHOS 75 09/27/2015 1133   AST 16 01/10/2016 1134   AST 13 09/27/2015 1133   ALT 6 01/10/2016 1134   ALT <9 09/27/2015 1133   BILITOT 1.2 01/10/2016 1134   BILITOT 1.12 09/27/2015 1133       ASSESSMENT/PLAN:  1.   idiopathic Parkinson's disease.  The patient has tremor, bradykinesia, rigidity and mild postural instability.  Sx's have been present for at least since March, 2015 by the patients account today.  She is Hoehn and Yahr stage 2-2.5.    -  She went back down on the levodopa and looks more rigid today.  I told her that each time she is on a lower dosage of the levodopa, she has more rigidity so I encouraged her to increase it again to 2/1/1.  She is having some nighttime shuffling but doesn't want to add the CR at night right now. 3.  Mild dysphagia  -The patient states that this is no longer a problem 4.  Long hx migraine  -No longer a candidate for triptan therapy.  This is due to cerebral small vessel disease.  She does have Cambia for when necessary use.  Encouraged her to try it as she has been having more headaches.  Was nervous to try due to bad taste and told her that it is not nearly as bad as the headache feels. 4.  Neck pain/hyperreflexia  -MRI cervical spine in April, 2016 just demonstrated mild multilevel cervical spondylosis. 5.  REM behavior disorder  -Doing well with low-dose clonazepam, 0.5 mg, half tablet at night.  Discussed risks, benefits, side effects.  Understanding is expressed. 6.  Mild cognitive impairment  -Talked to  her about the fact that I saw no evidence of dementia today, although it can develop in the future.  She and I talked about the differences between mild cognitive impairment and Parkinson's related dementia.  -talked about importance of mental and physical activities and importance of remaining social   7.  Follow-up in the next few months, sooner should new neurologic issues arise.  Much greater than 50% of this visit was spent in counseling and coordinating care.  Total face to face time:  30 min

## 2016-03-09 ENCOUNTER — Other Ambulatory Visit: Payer: Self-pay | Admitting: Neurology

## 2016-03-12 ENCOUNTER — Telehealth: Payer: Self-pay | Admitting: Neurology

## 2016-03-12 ENCOUNTER — Other Ambulatory Visit: Payer: Self-pay | Admitting: Neurology

## 2016-03-12 NOTE — Telephone Encounter (Signed)
Patient made aware I sent in refill this morning.

## 2016-03-12 NOTE — Telephone Encounter (Signed)
Clonazepam refill requested. Per last office note- patient to remain on medication. Refill approved and called to patient's pharmacy.   

## 2016-03-12 NOTE — Telephone Encounter (Signed)
Pt needs a refill on the klonopin pt call back number is [phone]

## 2016-03-15 ENCOUNTER — Telehealth: Payer: Self-pay

## 2016-03-15 ENCOUNTER — Emergency Department
Admission: EM | Admit: 2016-03-15 | Discharge: 2016-03-15 | Disposition: A | Discharge status: Home / Self Care | Payer: Medicare Other | Attending: Emergency Medicine | Admitting: Emergency Medicine

## 2016-03-15 ENCOUNTER — Encounter: Payer: Self-pay | Admitting: Emergency Medicine

## 2016-03-15 DIAGNOSIS — J449 Chronic obstructive pulmonary disease, unspecified: Secondary | ICD-10-CM | POA: Diagnosis not present

## 2016-03-15 DIAGNOSIS — M47892 Other spondylosis, cervical region: Secondary | ICD-10-CM | POA: Diagnosis not present

## 2016-03-15 DIAGNOSIS — Z853 Personal history of malignant neoplasm of breast: Secondary | ICD-10-CM | POA: Diagnosis not present

## 2016-03-15 DIAGNOSIS — Z7982 Long term (current) use of aspirin: Secondary | ICD-10-CM | POA: Insufficient documentation

## 2016-03-15 DIAGNOSIS — G20 Parkinson's disease: Secondary | ICD-10-CM | POA: Insufficient documentation

## 2016-03-15 DIAGNOSIS — R0789 Other chest pain: Secondary | ICD-10-CM | POA: Diagnosis not present

## 2016-03-15 DIAGNOSIS — Z8679 Personal history of other diseases of the circulatory system: Secondary | ICD-10-CM | POA: Insufficient documentation

## 2016-03-15 DIAGNOSIS — Z79899 Other long term (current) drug therapy: Secondary | ICD-10-CM | POA: Insufficient documentation

## 2016-03-15 DIAGNOSIS — Z90710 Acquired absence of both cervix and uterus: Secondary | ICD-10-CM | POA: Insufficient documentation

## 2016-03-15 DIAGNOSIS — E782 Mixed hyperlipidemia: Secondary | ICD-10-CM | POA: Diagnosis not present

## 2016-03-15 DIAGNOSIS — J45909 Unspecified asthma, uncomplicated: Secondary | ICD-10-CM | POA: Diagnosis not present

## 2016-03-15 DIAGNOSIS — M1990 Unspecified osteoarthritis, unspecified site: Secondary | ICD-10-CM | POA: Insufficient documentation

## 2016-03-15 DIAGNOSIS — I10 Essential (primary) hypertension: Secondary | ICD-10-CM | POA: Insufficient documentation

## 2016-03-15 LAB — BASIC METABOLIC PANEL
ANION GAP: 8 (ref 5–15)
BUN: 12 mg/dL (ref 6–20)
CALCIUM: 9.3 mg/dL (ref 8.9–10.3)
CO2: 28 mmol/L (ref 22–32)
Chloride: 102 mmol/L (ref 101–111)
Creatinine, Ser: 0.67 mg/dL (ref 0.44–1.00)
Glucose, Bld: 97 mg/dL (ref 65–99)
Potassium: 3.6 mmol/L (ref 3.5–5.1)
Sodium: 138 mmol/L (ref 135–145)

## 2016-03-15 LAB — CBC
HCT: 41.4 % (ref 35.0–47.0)
HEMOGLOBIN: 13.9 g/dL (ref 12.0–16.0)
MCH: 31.2 pg (ref 26.0–34.0)
MCHC: 33.7 g/dL (ref 32.0–36.0)
MCV: 92.6 fL (ref 80.0–100.0)
Platelets: 173 10*3/uL (ref 150–440)
RBC: 4.47 MIL/uL (ref 3.80–5.20)
RDW: 13.9 % (ref 11.5–14.5)
WBC: 6 10*3/uL (ref 3.6–11.0)

## 2016-03-15 LAB — TROPONIN I: TROPONIN I: 0.03 ng/mL (ref <0.031)

## 2016-03-15 MED ORDER — FAMOTIDINE 20 MG PO TABS: 20.0000 mg | ORAL_TABLET | Freq: Two times a day (BID) | ORAL | Status: DC

## 2016-03-15 NOTE — Discharge Instructions (Signed)
Nonspecific Chest Pain  °Chest pain can be caused by many different conditions. There is always a chance that your pain could be related to something serious, such as a heart attack or a blood clot in your lungs. Chest pain can also be caused by conditions that are not life-threatening. If you have chest pain, it is very important to follow up with your health care provider. °CAUSES  °Chest pain can be caused by: °· Heartburn. °· Pneumonia or bronchitis. °· Anxiety or stress. °· Inflammation around your heart (pericarditis) or lung (pleuritis or pleurisy). °· A blood clot in your lung. °· A collapsed lung (pneumothorax). It can develop suddenly on its own (spontaneous pneumothorax) or from trauma to the chest. °· Shingles infection (varicella-zoster virus). °· Heart attack. °· Damage to the bones, muscles, and cartilage that make up your chest wall. This can include: °¨ Bruised bones due to injury. °¨ Strained muscles or cartilage due to frequent or repeated coughing or overwork. °¨ Fracture to one or more ribs. °¨ Sore cartilage due to inflammation (costochondritis). °RISK FACTORS  °Risk factors for chest pain may include: °· Activities that increase your risk for trauma or injury to your chest. °· Respiratory infections or conditions that cause frequent coughing. °· Medical conditions or overeating that can cause heartburn. °· Heart disease or family history of heart disease. °· Conditions or health behaviors that increase your risk of developing a blood clot. °· Having had chicken pox (varicella zoster). °SIGNS AND SYMPTOMS °Chest pain can feel like: °· Burning or tingling on the surface of your chest or deep in your chest. °· Crushing, pressure, aching, or squeezing pain. °· Dull or sharp pain that is worse when you move, cough, or take a deep breath. °· Pain that is also felt in your back, neck, shoulder, or arm, or pain that spreads to any of these areas. °Your chest pain may come and go, or it may stay  constant. °DIAGNOSIS °Lab tests or other studies may be needed to find the cause of your pain. Your health care provider may have you take a test called an ambulatory ECG (electrocardiogram). An ECG records your heartbeat patterns at the time the test is performed. You may also have other tests, such as: °· Transthoracic echocardiogram (TTE). During echocardiography, sound waves are used to create a picture of all of the heart structures and to look at how blood flows through your heart. °· Transesophageal echocardiogram (TEE). This is a more advanced imaging test that obtains images from inside your body. It allows your health care provider to see your heart in finer detail. °· Cardiac monitoring. This allows your health care provider to monitor your heart rate and rhythm in real time. °· Holter monitor. This is a portable device that records your heartbeat and can help to diagnose abnormal heartbeats. It allows your health care provider to track your heart activity for several days, if needed. °· Stress tests. These can be done through exercise or by taking medicine that makes your heart beat more quickly. °· Blood tests. °· Imaging tests. °TREATMENT  °Your treatment depends on what is causing your chest pain. Treatment may include: °· Medicines. These may include: °¨ Acid blockers for heartburn. °¨ Anti-inflammatory medicine. °¨ Pain medicine for inflammatory conditions. °¨ Antibiotic medicine, if an infection is present. °¨ Medicines to dissolve blood clots. °¨ Medicines to treat coronary artery disease. °· Supportive care for conditions that do not require medicines. This may include: °¨ Resting. °¨ Applying heat   or cold packs to injured areas. °¨ Limiting activities until pain decreases. °HOME CARE INSTRUCTIONS °· If you were prescribed an antibiotic medicine, finish it all even if you start to feel better. °· Avoid any activities that bring on chest pain. °· Do not use any tobacco products, including  cigarettes, chewing tobacco, or electronic cigarettes. If you need help quitting, ask your health care provider. °· Do not drink alcohol. °· Take medicines only as directed by your health care provider. °· Keep all follow-up visits as directed by your health care provider. This is important. This includes any further testing if your chest pain does not go away. °· If heartburn is the cause for your chest pain, you may be told to keep your head raised (elevated) while sleeping. This reduces the chance that acid will go from your stomach into your esophagus. °· Make lifestyle changes as directed by your health care provider. These may include: °¨ Getting regular exercise. Ask your health care provider to suggest some activities that are safe for you. °¨ Eating a heart-healthy diet. A registered dietitian can help you to learn healthy eating options. °¨ Maintaining a healthy weight. °¨ Managing diabetes, if necessary. °¨ Reducing stress. °SEEK MEDICAL CARE IF: °· Your chest pain does not go away after treatment. °· You have a rash with blisters on your chest. °· You have a fever. °SEEK IMMEDIATE MEDICAL CARE IF:  °· Your chest pain is worse. °· You have an increasing cough, or you cough up blood. °· You have severe abdominal pain. °· You have severe weakness. °· You faint. °· You have chills. °· You have sudden, unexplained chest discomfort. °· You have sudden, unexplained discomfort in your arms, back, neck, or jaw. °· You have shortness of breath at any time. °· You suddenly start to sweat, or your skin gets clammy. °· You feel nauseous or you vomit. °· You suddenly feel light-headed or dizzy. °· Your heart begins to beat quickly, or it feels like it is skipping beats. °These symptoms may represent a serious problem that is an emergency. Do not wait to see if the symptoms will go away. Get medical help right away. Call your local emergency services (911 in the [place] Do not drive yourself to the hospital. °  °This  information is not intended to replace advice given to you by your health care provider. Make sure you discuss any questions you have with your health care provider. °  °Document Released: 08/22/2005 Document Revised: 12/03/2014 Document Reviewed: 06/18/2014 °Elsevier Interactive Patient Education ©2016 [employer] ° °

## 2016-03-15 NOTE — Telephone Encounter (Signed)
Pt walked in with CP; pain level 1-2; no radiation of pain today but earlier in week had episode of CP and both jaws hurt and pt was clammy, SOB but usually has SOB with COPD, did appear slight SOB when walking into exam room,BP at home was 160/90. Now pt does not appear in distress and said CP has resolved. BP 140/100 and P 72; pulse ox initially 91 but after rest and deep breaths pulse ox was 96%. Pt has had CP on and off for years; pt had cardiac cath approx 10 years ago and has not see cardiologist, Dr Crystal Bass in over 5 years. Pts friend Crystal Bass is taking pt to [facility] ED.

## 2016-03-15 NOTE — ED Provider Notes (Signed)
[facility] Emergency Department Provider Note  ____________________________________________  Time seen: 1:00 PM  I have reviewed the triage vital signs and the nursing notes.   HISTORY  Chief Complaint Chest Pain    HPI Crystal Bass is a 75 y.o. female who complains of intermittent chest pain for the past 3 or 4 weeks. She has the pain happens about 2 or 3 times per week. Most recent episode was this morning at 8:30 AM. She was vacuuming at that time. Usually the pain happens at rest. It seems to be worse at night when she is laying flat in associate with some sore throat. This morning it lasted for 5-10 minutes, described as achiness. Nonradiating no diaphoresis vomiting or shortness of breath or dizziness. Pain is not exertional, not pleuritic. He has not tried antacids. Since about 840 this morning she's been asymptomatic.     Past Medical History  Diagnosis Date  . Pancreatitis     secondary to Cholelithiasis  . Breast cancer ([place]) 2002    infiltrative ductal carcinoma   . Lesion of breast 1992    right, benign  . Post-thoracotomy pain syndrome   . Uterine fibroid   . Endometriosis   . Ovarian cancer ([place]) 2004  . Aneurysm of aorta ([facility])     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  . Osteoporosis, post-menopausal 03/14/2012  . Ductal carcinoma of breast, estrogen receptor positive, stage 1 ([place]) 09/16/2012  . Pseudomonas pneumonia ([place]) 5/09  . COPD (chronic obstructive pulmonary disease) ([place]) 9/08  . Mastalgia 7/95  . Lung disease     secondary to [name] infection  . Hypercalcemia 09/14/2013  . Vitamin D deficiency   . Aortic root aneurysm ([place])   . Parkinson disease ([place]) 02/10/15     Patient Active Problem List   Diagnosis Date Noted  . Parkinson disease ([place]) 01/10/2016  . DNR (do not resuscitate) 05/22/2015  . Tremor 02/07/2015  . Dysphagia, pharyngoesophageal phase 02/07/2015  . Hypercalcemia 09/14/2013  . Unspecified vitamin D  deficiency 04/27/2013  . Breast cancer, left breast ([place]) 09/16/2012  . Osteoporosis, post-menopausal 03/14/2012  . AORTIC ANEURYSM 01/01/2011  . OSTEOARTHRITIS, CERVICAL SPINE 08/15/2009  . HYPERGLYCEMIA, FASTING 12/06/2008  . ALLERGIC RHINITIS 03/25/2008  . Chronic obstructive airway disease with asthma ([place]) 03/25/2008  . BREAST CANCER, HX OF 03/10/2008  . Mixed hyperlipidemia 02/10/2007  . Essential hypertension 02/10/2007     Past Surgical History  Procedure Laterality Date  . Lung biopsy  2002    [name], Dr [name]  . Cardiac catheterization  2007    essentially negation for significant CAD  . Appendectomy  2004  . Cholecystectomy  2001  . 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 Breast lumpectomy  1992    benign  . Mastectomy modified radical  2002    oral chemotheraphy (tamoxifen then Armidex) no radiation, Dr.[name]  . Tonsillectomy  1946  . Breast biopsy  08/2004    right breast-benign  . Colonoscopy  2014    Dr [name] , due 2019     Current Outpatient Rx  Name  Route  Sig  Dispense  Refill  . acetaminophen (TYLENOL) 325 MG tablet   Oral   Take 1,000 mg by mouth 2 (two) times daily as needed.          [name] aspirin 81 MG tablet   Oral   Take 81 mg by  mouth daily.           . carbidopa-levodopa ([name]) 25-100 MG tablet      TAKE 2 TABLETS IN THE MORNING, 1 TABLET IN THE AFTERNOON, 1 IN THE EVENING Patient taking differently: 1 tablet 3 times daily   360 tablet   1   . clonazePAM (KLONOPIN) 0.5 MG tablet      TAKE 0.5-1 TABLET BY MOUTH AT BEDTIME   90 tablet   0     This request is for a new prescription for a contr ...   . famotidine (PEPCID) 20 MG tablet   Oral   Take 1 tablet (20 mg total) by mouth 2 (two) times daily.   60 tablet   0   . gabapentin ([name]) 300 MG capsule      TAKE 1-3 CAPSULES (300-900 MG TOTAL) BY MOUTH 3 (THREE) TIMES DAILY. Patient taking differently: 2 tablets in  the morning, 1 in the afternoon, 2 in the evening   270 capsule   3   . losartan (COZAAR) 50 MG tablet      TAKE 1 TABLET EVERY DAY   90 tablet   3   . metoprolol tartrate (LOPRESSOR) 25 MG tablet      1 by mouth twice a day   180 tablet   3   . Vitamin D, Ergocalciferol, ([name]) 50000 UNITS CAPS capsule      TAKE 1 CAPSULE BY MOUTH EVERY WEEK   26 capsule   1   . zolendronic acid (ZOMETA) 4 MG/5ML injection   Intravenous   Inject 5 mLs (4 mg total) into the vein every 6 (six) months.   5 mL   0      Allergies Sulfonamide derivatives; Topamax; Biaxin; Hydrocodone; Motrin; Symbicort; Percocet; Tape; and Tetracyclines & related   Family History  Problem Relation Age of Onset  . Lung cancer Father   . Breast cancer Sister 32  . Colon cancer Sister   . Stroke Paternal Aunt     in mid 66s  . Osteoporosis Paternal Aunt   . Myasthenia gravis Paternal Aunt   . Emphysema Mother     was never a smoker  . Diabetes Neg Hx   . Heart disease Neg Hx     Social History Social History  Substance Use Topics  . Smoking status: Never Smoker   . Smokeless tobacco: Never Used  . Alcohol Use: No    Review of Systems  Constitutional:   No fever or chills.  Eyes:   No vision changes.  ENT:   No sore throat. No rhinorrhea. Cardiovascular:   Positive as above chest pain. Respiratory:   No dyspnea or cough. Gastrointestinal:   Negative for abdominal pain, vomiting and diarrhea.  No bloody stool. Genitourinary:   Negative for dysuria or difficulty urinating. Musculoskeletal:   Negative for focal pain or swelling Neurological:   Negative for headaches 10-point ROS otherwise negative.  ____________________________________________   PHYSICAL EXAM:  VITAL SIGNS: ED Triage Vitals  Enc Vitals Group     BP 03/15/16 1121 160/94 mmHg     Pulse Rate 03/15/16 1121 65     Resp 03/15/16 1121 18     Temp 03/15/16 1121 97.5 F (36.4 C)     Temp Source 03/15/16 1121 Oral      SpO2 03/15/16 1121 97 %     Weight 03/15/16 1121 156 lb (70.761 kg)     Height 03/15/16 1121 5' 2.5" (1.588 m)  Head Cir --      Peak Flow --      Pain Score 03/15/16 1121 0     Pain Loc --      Pain Edu? --      Excl. in [place]? --     Vital signs reviewed, nursing assessments reviewed.   Constitutional:   Alert and oriented. Well appearing and in no distress. Eyes:   No scleral icterus. No conjunctival pallor. PERRL. EOMI ENT   Head:   Normocephalic and atraumatic.   Nose:   No congestion/rhinnorhea. No septal hematoma   Mouth/Throat:   MMM, no pharyngeal erythema. No peritonsillar mass.    Neck:   No stridor. No SubQ emphysema. No meningismus. Hematological/Lymphatic/Immunilogical:   No cervical lymphadenopathy. Cardiovascular:   RRR. Symmetric bilateral radial and DP pulses.  No murmurs.  Respiratory:   Normal respiratory effort without tachypnea nor retractions. Breath sounds are clear and equal bilaterally. No wheezes/rales/rhonchi. Gastrointestinal:   Soft and nontender. Non distended. There is no CVA tenderness.  No rebound, rigidity, or guarding. Genitourinary:   deferred Musculoskeletal:   Nontender with normal range of motion in all extremities. No joint effusions.  No lower extremity tenderness.  No edema. Chest wall nontender Neurologic:   Normal speech and language.  CN 2-10 normal. Motor grossly intact. No gross focal neurologic deficits are appreciated.  Skin:    Skin is warm, dry and intact. No rash noted.  No petechiae, purpura, or bullae.  ____________________________________________    LABS (pertinent positives/negatives) (all labs ordered are listed, but only abnormal results are displayed) Labs Reviewed  BASIC METABOLIC PANEL  CBC  TROPONIN I  TROPONIN I   ____________________________________________   EKG  Interpreted by me Normal sinus rhythm rate of 62, normal axis and intervals. Poor R-wave progression in anterior precordial  leads. Normal ST segments and T waves. LVH in high lateral leads by voltage criteria.  ____________________________________________    RADIOLOGY    ____________________________________________   PROCEDURES   ____________________________________________   INITIAL IMPRESSION / ASSESSMENT AND PLAN / ED COURSE  Pertinent labs & imaging results that were available during my care of the patient were reviewed by me and considered in my medical decision making (see chart for details).  Patient presents with nonspecific atypical chest pain.Considering the patient's symptoms, medical history, and physical examination today, I have low suspicion for ACS, PE, TAD, pneumothorax, carditis, mediastinitis, pneumonia, CHF, or sepsis.  Repeat troponin negative after about 6 hours of symptom onset. EKG unremarkable. Symptoms are atypical. My suspicion is this is acid reflux. I'll have her follow up with primary care and cardiology. Start famotidine for now as a trial.     ____________________________________________   FINAL CLINICAL IMPRESSION(S) / ED DIAGNOSES  Final diagnoses:  Atypical chest pain       Portions of this note were generated with dragon dictation software. Dictation errors may occur despite best attempts at proofreading.   Carrie Mew, MD 03/15/16 [phone]

## 2016-03-15 NOTE — ED Notes (Signed)
Pt presents to ED with intermittent non-radiating chest pain for approximately two weeks. Pt denies other symptoms with the exception of SOB. Pt reports history of COPD.

## 2016-03-19 ENCOUNTER — Telehealth: Payer: Self-pay | Admitting: Hematology and Oncology

## 2016-03-19 NOTE — Telephone Encounter (Signed)
Patient called to r/s lab/fu/infusion from 4/26 to 5/3. Patient has new date/time.

## 2016-03-21 ENCOUNTER — Ambulatory Visit: Payer: Medicare Other

## 2016-03-21 ENCOUNTER — Ambulatory Visit: Payer: Medicare Other | Admitting: Hematology and Oncology

## 2016-03-21 ENCOUNTER — Other Ambulatory Visit: Payer: Medicare Other

## 2016-03-27 ENCOUNTER — Other Ambulatory Visit: Payer: Self-pay | Admitting: *Deleted

## 2016-03-27 DIAGNOSIS — C50912 Malignant neoplasm of unspecified site of left female breast: Secondary | ICD-10-CM

## 2016-03-28 ENCOUNTER — Ambulatory Visit (HOSPITAL_BASED_OUTPATIENT_CLINIC_OR_DEPARTMENT_OTHER): Payer: Medicare Other | Admitting: Hematology and Oncology

## 2016-03-28 ENCOUNTER — Other Ambulatory Visit (HOSPITAL_BASED_OUTPATIENT_CLINIC_OR_DEPARTMENT_OTHER): Payer: Medicare Other

## 2016-03-28 ENCOUNTER — Encounter: Payer: Self-pay | Admitting: Hematology and Oncology

## 2016-03-28 ENCOUNTER — Telehealth: Payer: Self-pay | Admitting: Hematology and Oncology

## 2016-03-28 ENCOUNTER — Ambulatory Visit: Payer: Medicare Other

## 2016-03-28 ENCOUNTER — Other Ambulatory Visit: Payer: Self-pay | Admitting: Hematology and Oncology

## 2016-03-28 VITALS — BP 119/70 | HR 63 | Temp 98.0°F | Resp 18 | Ht 62.5 in | Wt 155.4 lb

## 2016-03-28 DIAGNOSIS — C50912 Malignant neoplasm of unspecified site of left female breast: Secondary | ICD-10-CM

## 2016-03-28 DIAGNOSIS — Z853 Personal history of malignant neoplasm of breast: Secondary | ICD-10-CM

## 2016-03-28 DIAGNOSIS — Z1231 Encounter for screening mammogram for malignant neoplasm of breast: Secondary | ICD-10-CM

## 2016-03-28 DIAGNOSIS — C50412 Malignant neoplasm of upper-outer quadrant of left female breast: Secondary | ICD-10-CM

## 2016-03-28 DIAGNOSIS — M8580 Other specified disorders of bone density and structure, unspecified site: Secondary | ICD-10-CM

## 2016-03-28 DIAGNOSIS — Z170 Estrogen receptor positive status [ER+]: Secondary | ICD-10-CM

## 2016-03-28 DIAGNOSIS — R109 Unspecified abdominal pain: Secondary | ICD-10-CM

## 2016-03-28 LAB — CBC WITH DIFFERENTIAL/PLATELET
BASO%: 0.7 % (ref 0.0–2.0)
BASOS ABS: 0 10*3/uL (ref 0.0–0.1)
EOS%: 5.5 % (ref 0.0–7.0)
Eosinophils Absolute: 0.3 10*3/uL (ref 0.0–0.5)
HEMATOCRIT: 42.7 % (ref 34.8–46.6)
HEMOGLOBIN: 13.9 g/dL (ref 11.6–15.9)
LYMPH%: 19.2 % (ref 14.0–49.7)
MCH: 31.4 pg (ref 25.1–34.0)
MCHC: 32.6 g/dL (ref 31.5–36.0)
MCV: 96.4 fL (ref 79.5–101.0)
MONO#: 0.7 10*3/uL (ref 0.1–0.9)
MONO%: 10.9 % (ref 0.0–14.0)
NEUT#: 3.8 10*3/uL (ref 1.5–6.5)
NEUT%: 63.7 % (ref 38.4–76.8)
Platelets: 175 10*3/uL (ref 145–400)
RBC: 4.43 10*6/uL (ref 3.70–5.45)
RDW: 13.7 % (ref 11.2–14.5)
WBC: 6 10*3/uL (ref 3.9–10.3)
lymph#: 1.2 10*3/uL (ref 0.9–3.3)

## 2016-03-28 LAB — COMPREHENSIVE METABOLIC PANEL
ALBUMIN: 3.6 g/dL (ref 3.5–5.0)
AST: 18 U/L (ref 5–34)
Alkaline Phosphatase: 75 U/L (ref 40–150)
Anion Gap: 9 mEq/L (ref 3–11)
BUN: 15 mg/dL (ref 7.0–26.0)
CALCIUM: 9.5 mg/dL (ref 8.4–10.4)
CHLORIDE: 106 meq/L (ref 98–109)
CO2: 25 mEq/L (ref 22–29)
CREATININE: 0.9 mg/dL (ref 0.6–1.1)
EGFR: 68 mL/min/{1.73_m2} — ABNORMAL LOW (ref >90)
GLUCOSE: 98 mg/dL (ref 70–140)
Potassium: 3.8 mEq/L (ref 3.5–5.1)
Sodium: 140 mEq/L (ref 136–145)
Total Bilirubin: 1.12 mg/dL (ref 0.20–1.20)
Total Protein: 6.8 g/dL (ref 6.4–8.3)

## 2016-03-28 NOTE — Telephone Encounter (Signed)
appt made and avs printed °

## 2016-03-28 NOTE — Progress Notes (Signed)
Patient Care Team: Lucille Passy, MD as PCP - General (Family Medicine) Hendricks Limes, MD as Referring Physician (Internal Medicine) Annia Belt, MD as Consulting Physician (Oncology) Michel Bickers, MD as Consulting Physician (Infectious Diseases)  DIAGNOSIS: No matching staging information was found for the patient.  SUMMARY OF ONCOLOGIC HISTORY:   Breast cancer of upper-outer quadrant of left female breast ([place])   12/09/2000 Surgery Left breast modified radical mastectomy stage I ER positive   01/07/2001 - 01/15/2011 Anti-estrogen oral therapy Tamoxifen later transitioned to aromatase inhibitor    PET scan Lung nodule: Mycobacterium avium tuberculosis treated 2-3 occasions by Dr. Chase Caller   04/15/2003 Surgery Hysterectomy plus oophorectomy for low-grade ovarian cancer    CHIEF COMPLIANT: follow-up of left breast cancer  INTERVAL HISTORY: Crystal Bass is a 75 year old with above-mentioned left breast cancer currently on surveillance. She had taken 10 years of antiestrogen therapy and is currently in remission. She had a recent bone density which showed a T score of -2 which is a significant improvement from before. She had been getting Zometa every 6 months for the past 8 years. She was diagnosed with Parkinson's disease and appears to be making her short of breath.she denies any lumps or nodules in the breasts.  REVIEW OF SYSTEMS:   Constitutional: Denies fevers, chills or abnormal weight loss Eyes: Denies blurriness of vision Ears, nose, mouth, throat, and face: Denies mucositis or sore throat Respiratory: Denies cough, dyspnea or wheezes Cardiovascular: Denies palpitation, chest discomfort Gastrointestinal:  Denies nausea, heartburn or change in bowel habits Skin: Denies abnormal skin rashes Lymphatics: Denies new lymphadenopathy or easy bruising Neurological:Parkinson's disease Behavioral/Psych: Mood is stable, no new changes  Extremities: No lower extremity  edema Breast:  denies any pain or lumps or nodules in either breasts All other systems were reviewed with the patient and are negative.  I have reviewed the past medical history, past surgical history, social history and family history with the patient and they are unchanged from previous note.  ALLERGIES:  is allergic to sulfonamide derivatives; topamax; biaxin; hydrocodone; motrin; symbicort; percocet; tape; and tetracyclines & related.  MEDICATIONS:  Current Outpatient Prescriptions  Medication Sig Dispense Refill  . acetaminophen (TYLENOL) 325 MG tablet Take 1,000 mg by mouth 2 (two) times daily as needed.     [name] aspirin 81 MG tablet Take 81 mg by mouth daily.      . carbidopa-levodopa ([name]) 25-100 MG tablet TAKE 2 TABLETS IN THE MORNING, 1 TABLET IN THE AFTERNOON, 1 IN THE EVENING (Patient taking differently: 1 tablet 3 times daily) 360 tablet 1  . clonazePAM (KLONOPIN) 0.5 MG tablet TAKE 0.5-1 TABLET BY MOUTH AT BEDTIME 90 tablet 0  . famotidine (PEPCID) 20 MG tablet Take 1 tablet (20 mg total) by mouth 2 (two) times daily. 60 tablet 0  . gabapentin ([name]) 300 MG capsule TAKE 1-3 CAPSULES (300-900 MG TOTAL) BY MOUTH 3 (THREE) TIMES DAILY. (Patient taking differently: 2 tablets in the morning, 1 in the afternoon, 2 in the evening) 270 capsule 3  . losartan (COZAAR) 50 MG tablet TAKE 1 TABLET EVERY DAY 90 tablet 3  . metoprolol tartrate (LOPRESSOR) 25 MG tablet 1 by mouth twice a day 180 tablet 3  . Vitamin D, Ergocalciferol, ([name]) 50000 UNITS CAPS capsule TAKE 1 CAPSULE BY MOUTH EVERY WEEK 26 capsule 1  . zolendronic acid (ZOMETA) 4 MG/5ML injection Inject 5 mLs (4 mg total) into the vein every 6 (six) months. 5 mL 0  No current facility-administered medications for this visit.    PHYSICAL EXAMINATION: ECOG PERFORMANCE STATUS: 2 - Symptomatic, <50% confined to bed  Filed Vitals:   03/28/16 1047  BP: 119/70  Pulse: 63  Temp: 98 F (36.7 C)  Resp: 18   Filed  Weights   03/28/16 1047  Weight: 155 lb 6.4 oz (70.489 kg)    GENERAL:alert, no distress and comfortable SKIN: skin color, texture, turgor are normal, no rashes or significant lesions EYES: normal, Conjunctiva are pink and non-injected, sclera clear OROPHARYNX:no exudate, no erythema and lips, buccal mucosa, and tongue normal  NECK: supple, thyroid normal size, non-tender, without nodularity LYMPH:  no palpable lymphadenopathy in the cervical, axillary or inguinal LUNGS: clear to auscultation and percussion with normal breathing effort HEART: regular rate & rhythm and no murmurs and no lower extremity edema ABDOMEN:abdomen soft, non-tender and normal bowel sounds MUSCULOSKELETAL:no cyanosis of digits and no clubbing  NEURO: Parkinson's disease EXTREMITIES: No lower extremity edema BREAST: No palpable masses or nodules in either right or left breasts. No palpable axillary supraclavicular or infraclavicular adenopathy no breast tenderness or nipple discharge. (exam performed in the presence of a chaperone)  LABORATORY DATA:  I have reviewed the data as listed   Chemistry      Component Value Date/Time   NA 138 03/15/2016 1124   NA 140 09/27/2015 1133   K 3.6 03/15/2016 1124   K 4.2 09/27/2015 1133   CL 102 03/15/2016 1124   CL 102 03/10/2013 1529   CO2 28 03/15/2016 1124   CO2 26 09/27/2015 1133   BUN 12 03/15/2016 1124   BUN 19.7 09/27/2015 1133   CREATININE 0.67 03/15/2016 1124   CREATININE 0.8 09/27/2015 1133      Component Value Date/Time   CALCIUM 9.3 03/15/2016 1124   CALCIUM 9.3 09/27/2015 1133   ALKPHOS 68 01/10/2016 1134   ALKPHOS 75 09/27/2015 1133   AST 16 01/10/2016 1134   AST 13 09/27/2015 1133   ALT 6 01/10/2016 1134   ALT <9 09/27/2015 1133   BILITOT 1.2 01/10/2016 1134   BILITOT 1.12 09/27/2015 1133       Lab Results  Component Value Date   WBC 6.0 03/28/2016   HGB 13.9 03/28/2016   HCT 42.7 03/28/2016   MCV 96.4 03/28/2016   PLT 175 03/28/2016    NEUTROABS 3.8 03/28/2016   ASSESSMENT & PLAN:  Breast cancer of upper-outer quadrant of left female breast ([place]) Left breast stage I invasive ductal carcinoma ER positive treated with mastectomy followed by adjuvant antiestrogen therapy from 2002 to 2012. Currently on observation Pulmonary Mycobacterium avium tuberculosis being managed and treated by Dr. Chase Caller Low-grade ovarian cancer treated with hysterectomy and oophorectomy May 2004  Breast cancer surveillance: 1. Breast exam right breast 03/28/2016 is normal 2. Mammogram right breast 02/25/2015 is normal Parkinson's disease diagnosis  Osteopenia: Bone density done 02/22/2016 revealed a T score of -2.0 (improved from -2.6 in 2014) Since the patient had been on Zometa for the past 8 years, I recommended giving her a treatment break for 2 years before resuming it again  Right flank pain: Could be related to the nonobstructing kidney stone versus musculoskeletal in nature. We will observe this. I recommended transitioning her from our clinic to survivorship clinic in follow-up in one year  Follow-up in one year with survivorship clinic No orders of the defined types were placed in this encounter.   The patient has a good understanding of the overall plan. she agrees with  it. she will call with any problems that may develop before the next visit here.   Rulon Eisenmenger, MD 03/28/2016

## 2016-03-28 NOTE — Assessment & Plan Note (Signed)
Left breast stage I invasive ductal carcinoma ER positive treated with mastectomy followed by adjuvant antiestrogen therapy from 2002 to 2012. Currently on observation Pulmonary Mycobacterium avium tuberculosis being managed and treated by Dr. Chase Caller Low-grade ovarian cancer treated with hysterectomy and oophorectomy May 2004  Breast cancer surveillance: 1. Breast exam right breast 03/16/2015 is normal 2. Mammogram right breast 02/25/2015 is normal Parkinson's disease diagnosis  Right flank pain: Could be related to the nonobstructing kidney stone versus musculoskeletal in nature. We will observe this. I recommended transitioning her from our clinic to survivorship clinic in follow-up in one year Continue with every six-month Zometa injections with labs follow-up.  Slightly elevated bilirubin: I discussed that she should discuss it with her family physician. This is the first time it has ever been elevated. It could be monitored.

## 2016-04-12 ENCOUNTER — Ambulatory Visit: Payer: Medicare Other

## 2016-04-22 ENCOUNTER — Other Ambulatory Visit: Payer: Self-pay | Admitting: Family Medicine

## 2016-04-23 ENCOUNTER — Other Ambulatory Visit: Payer: Self-pay | Admitting: Family Medicine

## 2016-04-24 NOTE — Telephone Encounter (Signed)
Pt recently established care. pls advise 

## 2016-04-25 ENCOUNTER — Other Ambulatory Visit: Payer: Self-pay | Admitting: Family Medicine

## 2016-04-27 ENCOUNTER — Other Ambulatory Visit: Payer: Self-pay

## 2016-04-27 MED ORDER — METOPROLOL TARTRATE 25 MG PO TABS: 25.0000 mg | ORAL_TABLET | Freq: Two times a day (BID) | ORAL | Status: DC

## 2016-04-27 NOTE — Telephone Encounter (Signed)
Pt left v/m requesting # 90 for metoprolol to [facility]; last refilled by Dr Deborra Medina on 04/24/16 for # 14. Pt requesting cb. Pt established care on 01/10/16 and note said well controlled on current meds.Please advise.

## 2016-04-27 NOTE — Telephone Encounter (Signed)
BP well controlled during oncology visit recently.  Ok to refill as requested.

## 2016-05-21 ENCOUNTER — Other Ambulatory Visit: Payer: Self-pay | Admitting: *Deleted

## 2016-05-21 MED ORDER — GABAPENTIN 300 MG PO CAPS: ORAL_CAPSULE | ORAL | Status: DC

## 2016-05-31 ENCOUNTER — Ambulatory Visit (INDEPENDENT_AMBULATORY_CARE_PROVIDER_SITE_OTHER): Payer: Medicare Other | Admitting: Neurology

## 2016-05-31 ENCOUNTER — Encounter: Payer: Self-pay | Admitting: Neurology

## 2016-05-31 VITALS — BP 122/60 | HR 63 | Ht 62.5 in | Wt 145.0 lb

## 2016-05-31 DIAGNOSIS — M542 Cervicalgia: Secondary | ICD-10-CM

## 2016-05-31 DIAGNOSIS — G20 Parkinson's disease: Secondary | ICD-10-CM

## 2016-05-31 MED ORDER — CLONAZEPAM 0.5 MG PO TABS: ORAL_TABLET | ORAL | Status: DC

## 2016-05-31 MED ORDER — CARBIDOPA-LEVODOPA 25-100 MG PO TABS: ORAL_TABLET | ORAL | Status: DC

## 2016-05-31 NOTE — Progress Notes (Signed)
Crystal Bass was seen today in the movement disorders clinic for neurologic consultation at the request of [name], MD.  The consultation is for the evaluation of tremor.  Pt states that it has been going on over a year.  It is mostly in the R hand and rarely in the L hand.  "It is almost constant but not quite."  She notices it when grasping the steering wheel.  Eating was troublesome but better now that d/c the symbicort.  Symbicort worsened tremor by 80%.  Paternal aunt with tremor.  05/17/15 update:  Last visit, I diagnosed the patient with Parkinson's disease and started her on carbidopa/levodopa 25/100, one tablet 3 times per day.  She states that for the first 6 weeks she had diarrhea but it got better and it went away.  She does think that the tremor is better but does know that stress will trigger the tremor.  She also states that speech therapy seems to set off tremor.  She was started in physical therapy at [facility].  She has noted that she is waking up from dreams yelling and acting out dreams.  She does have a long history of migraine headaches and last visit had wanted me to prescribe Imitrex.  I was very nervous that her age and did not feel that she was likely a triptan candidate.  I ended up ordering an MRI of the brain on 02/28/2015.  I reviewed this and there was evidence of moderate small vessel disease.  I subsequently told her that she was not a candidate for the triptans.  She also had an MRI of the cervical spine on a performed, 2016.  There was mild multilevel cervical spondylosis.  Unfortunately, she has had several headaches since last visit that were fairly severe.  She did see Dr. [name] since last visit in regards to dysphagia and she states that it was scheduled but she ended up having to reschedule it.  08/23/15 update:  The patient is following up today.  She remains on carbidopa/levodopa 25/100, one tablet 3 times per day (8:30am/2pm/10PM).  She does have some  diarrhea that she associates with taking the pill but feels it is better than when she first started taking it.  She also states that it takes her until mid afternoon to feel normal and just feels slow in the morning.  She was started on clonazepam last visit for REM behavior disorder and that has helped dramatically.  She has had some spells of weakness in which her legs give out.  It may happen a few times a week.  She does not relate that to the addition of clonazepam at all.  No falls.  No hallucinations.  She was exercising until her back started hurting again.   In regards to her migraines, I gave her a prescription for Cambia last visit.  She is not a candidate for the triptans any longer.  She picked it up but hasn't had to use it yet.  She is on neurontin 600 mg bid for post surgical pain (states that she had mastectomy and open lung biopsy and then had terrible paresthesias).  Is still like that if doesn't take the neurontin, especially if she has a bra on.  She does complain that she thinks she is not thinking as clear as she used to.  11/28/14 update:  The patient is following up today.  Last visit, I increased her carbidopa/levodopa so that she is taking carbidopa/levodopa 25/100, 2  tablets in the morning, one in the afternoon and one in the evening.  I was hoping that this would help her morning "on."  Today, the patient states that she just has no energy and she is unsure if it is related to the medication; it started before the medication increase.  She thinks that the energy does get a bit better throughout the day.  She is on the klonopin and "I don't want to give that up as I don't dream anymore."  She has so little energy that she has not exercised much.  Does want to go back to PT at [facility].  She is not snoring at night that she knows of.  She's had no falls since last visit.  No hallucinations.  No lightheadedness or near syncope.  She has a has a history of rare migraine.  She does have  Cambia at home but has not had to use that.  She had a "bad" headache last night but got rid of it with tylenol, aleve, and coke.  Also c/o issues with BM's - both constipation and diarrhea.  03/01/16 update: The patient is following up today regarding her Parkinson's disease.  I have had the opportunity to review her prior records made available to me.  I last visit, she was complaining about loss of energy.  It was not sure that this was really related to her medications, but we decided to strategically hold medications.  She held her levodopa for about 4 days, because she thought that that was the culprit.  She felt no different in terms of energy off of the medication and ended up going back on the carbidopa/levodopa 25/100, but for some reason she went from 2 in the morning, one in the afternoon and one in the evening to 1 tablet three times a day (states that she didn't think that the higher dose made a difference).  We subsequently held her clonazepam and her energy may have been a little bit better, but she felt that it was certainly not worth being off of the medication, because she has terrible dreams and acting out the dreams off of the medications.  We decided subsequently to that selegiline for its amphetamine-like properties, but she decided not to take the medication after it was prescribed.  She reported that she wanted to try to increase her energy through exercise.  She is doing the BIG/LOUD exercises at home and walks some.  Brings in a [facility] form for exercise today.  No hallucination but rare visual distortion.  She has had some headaches but hasn't tried the cambia.    06/01/16 update:  The patient is following up today.  Last visit, I encouraged the patient to increase her levodopa back to 2 tablets in the morning, one in the afternoon and one evening, because every time she decreased it she became more rigid.  She states that she did that and she is doing "okay."  She also has a history  of REM behavior disorder and is on half a tablet of clonazepam at nighttime.  She denies falls since last visit.  No hallucinations.  May have occasional lightheadedness but no near syncope.  Thinks that she has "muscle weakness" but points to the neck and states that it is painful and then will feel like the head will tumble off.  Asks me about "posture PT" at [facility].     PREVIOUS MEDICATIONS: none to date  ALLERGIES:   Allergies  Allergen Reactions  .  Sulfonamide Derivatives Anaphylaxis  . Topamax [Topiramate] Other (See Comments)    Metabolic acidosis   . Biaxin [Clarithromycin] Other (See Comments)    pericarditis  . Hydrocodone Other (See Comments)    "hyper and climbing the walls"  . Motrin [Ibuprofen] Other (See Comments)    headaches  . Symbicort [Budesonide-Formoterol Fumarate]     02/07/15 tremor  . Percocet [Oxycodone-Acetaminophen] Itching and Rash  . Tape Itching and Rash    Use paper tape only  . Tetracyclines & Related Other (See Comments)    "immediate yeast infection"    CURRENT MEDICATIONS:  Outpatient Encounter Prescriptions as of 05/31/2016  Medication Sig  . acetaminophen (TYLENOL) 325 MG tablet Take 1,000 mg by mouth 2 (two) times daily as needed.   [name] aspirin 81 MG tablet Take 81 mg by mouth daily.    . carbidopa-levodopa ([name]) 25-100 MG tablet TAKE 2 TABLETS IN THE MORNING, 1 TABLET IN THE AFTERNOON, 1 IN THE EVENING (Patient taking differently: 1 tablet 3 times daily)  . clonazePAM (KLONOPIN) 0.5 MG tablet TAKE 0.5-1 TABLET BY MOUTH AT BEDTIME  . gabapentin ([name]) 300 MG capsule 2 tablets in the morning, 1 in the afternoon and 2 in the evening (Patient taking differently: Take 600 mg by mouth 3 (three) times daily. )  . losartan (COZAAR) 50 MG tablet TAKE 1 TABLET EVERY DAY  . metoprolol tartrate (LOPRESSOR) 25 MG tablet Take 1 tablet (25 mg total) by mouth 2 (two) times daily.  . Vitamin D, Ergocalciferol, ([name]) 50000 UNITS CAPS capsule  TAKE 1 CAPSULE BY MOUTH EVERY WEEK  . [DISCONTINUED] famotidine (PEPCID) 20 MG tablet Take 1 tablet (20 mg total) by mouth 2 (two) times daily.  . [DISCONTINUED] zolendronic acid (ZOMETA) 4 MG/5ML injection Inject 5 mLs (4 mg total) into the vein every 6 (six) months.   No facility-administered encounter medications on file as of 05/31/2016.    PAST MEDICAL HISTORY:   Past Medical History  Diagnosis Date  . Pancreatitis     secondary to Cholelithiasis  . Breast cancer ([place]) 2002    infiltrative ductal carcinoma   . Lesion of breast 1992    right, benign  . Post-thoracotomy pain syndrome   . Uterine fibroid   . Endometriosis   . Ovarian cancer ([place]) 2004  . Aneurysm of aorta ([facility])     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  . Osteoporosis, post-menopausal 03/14/2012  . Ductal carcinoma of breast, estrogen receptor positive, stage 1 ([place]) 09/16/2012  . Pseudomonas pneumonia ([place]) 5/09  . COPD (chronic obstructive pulmonary disease) ([place]) 9/08  . Mastalgia 7/95  . Lung disease     secondary to [name] infection  . Hypercalcemia 09/14/2013  . Vitamin D deficiency   . Aortic root aneurysm ([place])   . Parkinson disease ([place]) 02/10/15    PAST SURGICAL HISTORY:   Past Surgical History  Procedure Laterality Date  . Lung biopsy  2002    [name], Dr [name]  . Cardiac catheterization  2007    essentially negation for significant CAD  . Appendectomy  2004  . Cholecystectomy  2001  . 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 Breast lumpectomy  1992    benign  . Mastectomy modified radical  2002    oral chemotheraphy (tamoxifen then Armidex) no radiation, Dr.[name]  . Tonsillectomy  1946  . Breast biopsy  08/2004    right breast-benign  .  Colonoscopy  2014    Dr [name] , due 2019    SOCIAL HISTORY:   Social History   Social History  . Marital Status: Widowed    Spouse Name: N/A  . Number of Children: 0  . Years of Education: N/A    Occupational History  . 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 Smoking status: Never Smoker   . Smokeless tobacco: Never Used  . Alcohol Use: No  . Drug Use: No  . Sexual Activity: No     Comment: TAH/BSO   Other Topics Concern  . 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 Father Deceased     lung cancer  . Sister Alive     colon/breast cancer  . Mother Deceased     emphysema  . Brother Alive     healthy    ROS:  A complete 10 system review of systems was obtained and was unremarkable apart from what is mentioned above.  PHYSICAL EXAMINATION:    VITALS:   Filed Vitals:   05/31/16 1047  BP: 122/60  Pulse: 63  Height: 5' 2.5" (1.588 m)  Weight: 145 lb (65.772 kg)    GEN:  The patient appears stated age and is in NAD. HEENT:  Normocephalic, atraumatic.  The mucous membranes are moist. The superficial temporal arteries are without ropiness or tenderness. CV:  RRR Lungs:  CTAB Neck/HEME:  There are no carotid bruits bilaterally.  Neurological examination:  Orientation: The patient is alert and oriented x3. Fund of knowledge is appropriate.  Recent and remote memory are intact.  Attention and concentration are normal.    Able to name objects and repeat phrases. Cranial nerves: There is good facial symmetry with the exception of the fact that when asked to raise the eyebrows, the right eyebrow raises further than the left.  There is mild facial hypomimia. The speech is fluent and clear. Soft palate rises symmetrically and there is no tongue deviation. Hearing is intact to conversational tone. Sensation: Sensation is intact to light touch throughout. Motor: Strength is 5/5 in the bilateral upper and lower extremities.   Shoulder shrug is equal and symmetric.  There is no pronator drift. DTR's:   2+/4 at the bilateral biceps, triceps, brachioradialis, patella.     Movement examination: Tone: There is normal tone bilaterally (improved) Abnormal movements: There is a tremor on the R that can be felt and not seen Coordination:  There is good rapid alternating movements today. Gait and Station: The patient has no difficulty arising out of a deep-seated chair without the use of the hands. The patient's stride length is normal, and good (but purposeful) arm swing  LABS  Lab Results  Component Value Date   WBC 6.0 03/28/2016   HGB 13.9 03/28/2016   HCT 42.7 03/28/2016   MCV 96.4 03/28/2016   PLT 175 03/28/2016     Chemistry      Component Value Date/Time   NA 140 03/28/2016 1028   NA 138 03/15/2016 1124   K 3.8 03/28/2016 1028   K 3.6 03/15/2016 1124   CL 102 03/15/2016 1124   CL 102 03/10/2013 1529   CO2 25 03/28/2016 1028   CO2 28 03/15/2016 1124   BUN 15.0 03/28/2016 1028  BUN 12 03/15/2016 1124   CREATININE 0.9 03/28/2016 1028   CREATININE 0.67 03/15/2016 1124      Component Value Date/Time   CALCIUM 9.5 03/28/2016 1028   CALCIUM 9.3 03/15/2016 1124   ALKPHOS 75 03/28/2016 1028   ALKPHOS 68 01/10/2016 1134   AST 18 03/28/2016 1028   AST 16 01/10/2016 1134   ALT <9 03/28/2016 1028   ALT 6 01/10/2016 1134   BILITOT 1.12 03/28/2016 1028   BILITOT 1.2 01/10/2016 1134       ASSESSMENT/PLAN:  1.   idiopathic Parkinson's disease.  The patient has tremor, bradykinesia, rigidity and mild postural instability.  Sx's have been present for at least since March, 2015 by the patients account today.  She is Hoehn and Yahr stage 2-2.5.    -She looks better on carbidopa/levodopa 25/100,  2/1/1.  Will continue that  -encouraged her to add consistent, safe CV exercise  -PT order given  -handicap parking given 3.  Mild dysphagia  -The patient states that this is no longer a problem 4.  Long hx migraine  -No longer a candidate for triptan therapy.  This is due to  cerebral small vessel disease.  She does have Cambia for when necessary use.  Encouraged her to try it as she has been having more headaches.  Was nervous to try due to bad taste and told her that it is not nearly as bad as the headache feels. 4.  Neck pain/hyperreflexia  -MRI cervical spine in April, 2016 just demonstrated mild multilevel cervical spondylosis.  -c/o this today and asks for RX for "posture PT" at [facility] and gave her this RX 5.  REM behavior disorder  -Doing well with low-dose clonazepam, 0.5 mg, half tablet at night.  Discussed risks, benefits, side effects.  Understanding is expressed. 6.  Mild cognitive impairment  -Talked to her about the fact that I saw no evidence of dementia today, although it can develop in the future.  She and I talked about the differences between mild cognitive impairment and Parkinson's related dementia.  -talked about importance of mental and physical activities and importance of remaining social   7.  Follow-up in the next few months, sooner should new neurologic issues arise.  Much greater than 50% of this visit was spent in counseling and coordinating care.  Total face to face time:  25 min

## 2016-06-18 ENCOUNTER — Ambulatory Visit
Admission: RE | Admit: 2016-06-18 | Discharge: 2016-06-18 | Disposition: A | Discharge status: Home / Self Care | Payer: Medicare Other | Source: Ambulatory Visit | Attending: Hematology and Oncology | Admitting: Hematology and Oncology

## 2016-06-18 DIAGNOSIS — Z1231 Encounter for screening mammogram for malignant neoplasm of breast: Secondary | ICD-10-CM

## 2016-07-26 ENCOUNTER — Other Ambulatory Visit: Payer: Self-pay | Admitting: Family Medicine

## 2016-07-26 NOTE — Telephone Encounter (Signed)
Pt established care 12/2015. pls advise

## 2016-08-07 ENCOUNTER — Ambulatory Visit (INDEPENDENT_AMBULATORY_CARE_PROVIDER_SITE_OTHER)
Admission: RE | Admit: 2016-08-07 | Discharge: 2016-08-07 | Disposition: A | Discharge status: Home / Self Care | Payer: Medicare Other | Source: Ambulatory Visit | Attending: Family Medicine | Admitting: Family Medicine

## 2016-08-07 ENCOUNTER — Ambulatory Visit (INDEPENDENT_AMBULATORY_CARE_PROVIDER_SITE_OTHER): Payer: Medicare Other | Admitting: Family Medicine

## 2016-08-07 ENCOUNTER — Encounter: Payer: Self-pay | Admitting: Family Medicine

## 2016-08-07 VITALS — BP 142/92 | HR 63 | Temp 97.3°F | Wt 145.5 lb

## 2016-08-07 DIAGNOSIS — R05 Cough: Secondary | ICD-10-CM | POA: Diagnosis not present

## 2016-08-07 DIAGNOSIS — R059 Cough, unspecified: Secondary | ICD-10-CM

## 2016-08-07 NOTE — Progress Notes (Signed)
SUBJECTIVE:  Crystal Bass is a 75 y.o. female who complains of congestion, productive cough, myalgias and right upper back pain for 5 days. She denies a history of anorexia and dizziness . Patient denies smoke cigarettes.   She does have a long history of asthma/COPD but does not use inhalers regularly because it worsens her tremor.  She does have a rescue inhaler that she will use "if she gets into trouble."  Current Outpatient Prescriptions on File Prior to Visit  Medication Sig Dispense Refill  . acetaminophen (TYLENOL) 325 MG tablet Take 1,000 mg by mouth 2 (two) times daily as needed.     [name] aspirin 81 MG tablet Take 81 mg by mouth daily.      . carbidopa-levodopa ([name]) 25-100 MG tablet TAKE 2 TABLETS IN THE MORNING, 1 TABLET IN THE AFTERNOON, 1 IN THE EVENING 360 tablet 1  . clonazePAM (KLONOPIN) 0.5 MG tablet TAKE 0.5-1 TABLET BY MOUTH AT BEDTIME 90 tablet 1  . gabapentin ([name]) 300 MG capsule 2 tablets in the morning, 1 in the afternoon and 2 in the evening (Patient taking differently: Take 600 mg by mouth 3 (three) times daily. ) 450 capsule 3  . losartan (COZAAR) 50 MG tablet TAKE 1 TABLET EVERY DAY 90 tablet 3  . metoprolol tartrate (LOPRESSOR) 25 MG tablet TAKE 1 TABLET BY MOUTH TWICE A DAY 180 tablet 0  . Vitamin D, Ergocalciferol, ([name]) 50000 UNITS CAPS capsule TAKE 1 CAPSULE BY MOUTH EVERY WEEK 26 capsule 1   No current facility-administered medications on file prior to visit.     Allergies  Allergen Reactions  . Sulfonamide Derivatives Anaphylaxis  . Topamax [Topiramate] Other (See Comments)    Metabolic acidosis   . Biaxin [Clarithromycin] Other (See Comments)    pericarditis  . Hydrocodone Other (See Comments)    "hyper and climbing the walls"  . Motrin [Ibuprofen] Other (See Comments)    headaches  . Symbicort [Budesonide-Formoterol Fumarate]     02/07/15 tremor  . Percocet [Oxycodone-Acetaminophen] Itching and Rash  . Tape Itching and Rash     Use paper tape only  . Tetracyclines & Related Other (See Comments)    "immediate yeast infection"    Past Medical History:  Diagnosis Date  . 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 Aortic root aneurysm ([place])   . Breast cancer ([place]) 2002   infiltrative ductal carcinoma   . COPD (chronic obstructive pulmonary disease) ([place]) 9/08  . Ductal carcinoma of breast, estrogen receptor positive, stage 1 ([place]) 09/16/2012  . Endometriosis   . Hypercalcemia 09/14/2013  . Lesion of breast 1992   right, benign  . Lung disease    secondary to [name] infection  . Mastalgia 7/95  . Osteoporosis, post-menopausal 03/14/2012  . Ovarian cancer ([place]) 2004  . Pancreatitis    secondary to Cholelithiasis  . Parkinson disease ([place]) 02/10/15  . Post-thoracotomy pain syndrome   . Pseudomonas pneumonia ([place]) 5/09  . Uterine fibroid   . Vitamin D deficiency     Past Surgical History:  Procedure Laterality Date  . 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 APPENDECTOMY  2004  . BREAST BIOPSY  08/2004   right breast-benign  . BREAST LUMPECTOMY  1992   benign  . CARDIAC CATHETERIZATION  2007   essentially negation for significant CAD  . CHOLECYSTECTOMY  2001  .  COLONOSCOPY  2014   Dr [name] , due 2019  . LUNG BIOPSY  2002   [name], Dr [name]  . MASTECTOMY MODIFIED RADICAL  2002   oral chemotheraphy (tamoxifen then Armidex) no radiation, Dr.[name]  . TONSILLECTOMY  1946    Family History  Problem Relation Age of Onset  . Lung cancer Father   . Breast cancer Sister 59  . Colon cancer Sister   . Stroke Paternal Aunt     in mid 18s  . Osteoporosis Paternal Aunt   . Myasthenia gravis Paternal Aunt   . Emphysema Mother     was never a smoker  . Diabetes Neg Hx   . Heart disease Neg Hx     Social History   Social History  . Marital status: Widowed    Spouse name: N/A  . Number of children: 0  . Years of education: N/A    Occupational History  . 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 Smoking status: Never Smoker  . Smokeless tobacco: Never Used  . Alcohol use No  . Drug use: No  . Sexual activity: No     Comment: TAH/BSO   Other Topics Concern  . 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  OBJECTIVE: BP (!) 142/92   Pulse 63   Temp 97.3 F (36.3 C) (Oral)   Wt 145 lb 8 oz (66 kg)   SpO2 96%   BMI 26.19 kg/m   She appears well, vital signs are as noted. Ears normal.  Throat and pharynx normal.  Neck supple. No adenopathy in the neck. Nose is congested. Sinuses non tender. The chest is clear, without wheezes or rales.  ASSESSMENT:  viral upper respiratory illness  PLAN: Symptomatic therapy suggested: push fluids, rest and return office visit prn if symptoms persist or worsen. Lungs clear on exam- will get CXR given her history and new back pain. The patient indicates understanding of these issues and agrees with the plan.

## 2016-08-07 NOTE — Progress Notes (Signed)
Pre visit review using our clinic review tool, if applicable. No additional management support is needed unless otherwise documented below in the visit note. 

## 2016-08-21 ENCOUNTER — Ambulatory Visit (INDEPENDENT_AMBULATORY_CARE_PROVIDER_SITE_OTHER): Payer: Medicare Other | Admitting: Obstetrics & Gynecology

## 2016-08-21 ENCOUNTER — Encounter: Payer: Self-pay | Admitting: Obstetrics & Gynecology

## 2016-08-21 VITALS — BP 126/70 | HR 68 | Resp 12 | Ht 63.0 in | Wt 147.2 lb

## 2016-08-21 DIAGNOSIS — Z01419 Encounter for gynecological examination (general) (routine) without abnormal findings: Secondary | ICD-10-CM

## 2016-08-21 DIAGNOSIS — E559 Vitamin D deficiency, unspecified: Secondary | ICD-10-CM

## 2016-08-21 DIAGNOSIS — C569 Malignant neoplasm of unspecified ovary: Secondary | ICD-10-CM

## 2016-08-21 NOTE — Progress Notes (Signed)
75 y.o. G0P0000 WidowedCaucasianF here for annual exam.   No vaginal bleeding.    Pt is now off her Zometa.  Not taking calcium.    No LMP recorded. Patient has had a hysterectomy.          Sexually active: No.  The current method of family planning is status post hysterectomy.    Exercising: No.  The patient does not participate in regular exercise at present. Smoker:  no  Health Maintenance: Pap:  11/11 History of abnormal Pap:  no MMG:  06/18/16 BIRADS1 negative Colonoscopy:  4/14-repeat in 5 years-Dr. [name] BMD:   02/08/16 osteopenia TDaP:  2012 Screening Labs: drawn today, Hb today: drawn today, Urine today: declined   reports that she has never smoked. She has never used smokeless tobacco. She reports that she does not drink alcohol or use drugs.  Past Medical History:  Diagnosis Date  . 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 Aortic root aneurysm ([place])   . Breast cancer ([place]) 2002   infiltrative ductal carcinoma   . COPD (chronic obstructive pulmonary disease) ([place]) 9/08  . Ductal carcinoma of breast, estrogen receptor positive, stage 1 ([place]) 09/16/2012  . Endometriosis   . Hypercalcemia 09/14/2013  . Lesion of breast 1992   right, benign  . Lung disease    secondary to [name] infection  . Mastalgia 7/95  . Osteoporosis, post-menopausal 03/14/2012  . Ovarian cancer ([place]) 2004  . Pancreatitis    secondary to Cholelithiasis  . Parkinson disease ([place]) 02/10/15  . Post-thoracotomy pain syndrome   . Pseudomonas pneumonia ([place]) 5/09  . Uterine fibroid   . Vitamin D deficiency     Past Surgical History:  Procedure Laterality Date  . 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 APPENDECTOMY  2004  . BREAST BIOPSY  08/2004   right breast-benign  . BREAST LUMPECTOMY  1992   benign  . CARDIAC CATHETERIZATION  2007   essentially negation for significant CAD  . CHOLECYSTECTOMY  2001  . COLONOSCOPY  2014   Dr [name] , due 2019  . LUNG BIOPSY  2002   [name], Dr [name]  . MASTECTOMY MODIFIED RADICAL  2002   oral chemotheraphy (tamoxifen then Armidex) no radiation, Dr.[name]  . TONSILLECTOMY  1946    Current Outpatient Prescriptions  Medication Sig Dispense Refill  . acetaminophen (TYLENOL) 325 MG tablet Take 1,000 mg by mouth 2 (two) times daily as needed.     [name] aspirin 81 MG tablet Take 81 mg by mouth daily.      . carbidopa-levodopa ([name]) 25-100 MG tablet TAKE 2 TABLETS IN THE MORNING, 1 TABLET IN THE AFTERNOON, 1 IN THE EVENING 360 tablet 1  . clonazePAM (KLONOPIN) 0.5 MG tablet TAKE 0.5-1 TABLET BY MOUTH AT BEDTIME 90 tablet 1  . gabapentin ([name]) 300 MG capsule 2 tablets in the morning, 1 in the afternoon and 2 in the evening (Patient taking differently: Take 600 mg by mouth 3 (three) times daily. ) 450 capsule 3  . losartan (COZAAR) 50 MG tablet TAKE 1 TABLET EVERY DAY 90 tablet 3  . metoprolol tartrate (LOPRESSOR) 25 MG tablet TAKE 1 TABLET BY MOUTH TWICE A DAY 180 tablet 0  . Vitamin D, Ergocalciferol, ([name]) 50000 UNITS CAPS capsule TAKE 1 CAPSULE BY MOUTH EVERY WEEK 26 capsule 1   No current facility-administered medications for this visit.  Family History  Problem Relation Age of Onset  . Lung cancer Father   . Breast cancer Sister 89  . Colon cancer Sister   . Stroke Paternal Aunt     in mid 10s  . Osteoporosis Paternal Aunt   . Myasthenia gravis Paternal Aunt   . Emphysema Mother     was never a smoker  . Diabetes Neg Hx   . Heart disease Neg Hx     ROS:  Pertinent items are noted in HPI.  Otherwise, a comprehensive ROS was negative.  Exam:   There were no vitals taken for this visit.  Weight change: @WEIGHTCHANGE @ Height:      Ht Readings from Last 3 Encounters:  05/31/16 5' 2.5" (1.588 m)  03/28/16 5' 2.5" (1.588 m)  03/15/16 5' 2.5" (1.588 m)    General appearance: alert, cooperative and appears stated age Head: Normocephalic, without  obvious abnormality, atraumatic Neck: no adenopathy, supple, symmetrical, trachea midline and thyroid normal to inspection and palpation Lungs: clear to auscultation bilaterally Breasts: absent left breast, right breast without masses, LAD, skin changes Heart: regular rate and rhythm Abdomen: soft, non-tender; bowel sounds normal; no masses,  no organomegaly Extremities: extremities normal, atraumatic, no cyanosis or edema Skin: Skin color, texture, turgor normal. No rashes or lesions Lymph nodes: Cervical, supraclavicular, and axillary nodes normal. No abnormal inguinal nodes palpated Neurologic: Grossly normal   Pelvic: External genitalia:  no lesions              Urethra:  normal appearing urethra with no masses, tenderness or lesions              Bartholins and Skenes: normal                 Vagina: normal appearing vagina with normal color and discharge, no lesions              Cervix: absent              Pap taken: No. Bimanual Exam:  Uterus:  uterus absent              Adnexa: no mass, fullness, tenderness               Rectovaginal: Confirms               Anus:  normal sphincter tone, no lesions  Chaperone was present for exam.  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 On Zometa 4mg  every 6 months. Vit D deficiency Elevated lipids 4.0cm aortic root aneurysm. Stable on CT 2015. Last measurement was 3.10mm. Noted initially in 2011 COPD. Stopped seeing pulmonologist.  Still seeing allergist. DNR obtained 2016.   P: Mammogram yearly No pap smear obtained today due to guidelines Ca-125 today Vit D obtained today Plan BMD in two years return annually or prn

## 2016-08-22 LAB — VITAMIN D 25 HYDROXY (VIT D DEFICIENCY, FRACTURES): Vit D, 25-Hydroxy: 50 ng/mL (ref 30–100)

## 2016-08-22 LAB — CA 125: CA 125: 15 U/mL (ref <35)

## 2016-09-04 ENCOUNTER — Other Ambulatory Visit: Payer: Self-pay | Admitting: Neurology

## 2016-09-10 ENCOUNTER — Other Ambulatory Visit: Payer: Self-pay | Admitting: Obstetrics & Gynecology

## 2016-09-10 NOTE — Telephone Encounter (Signed)
Medication refill request: Vitamin D  Last AEX:  08-21-16 Next AEX: 08-26-17 Last MMG (if hormonal medication request): 06-19-16 WNL  Refill authorized: please advise

## 2016-09-29 NOTE — Progress Notes (Signed)
Crystal Bass was seen today in the movement disorders clinic for neurologic consultation at the request of [name], MD.  The consultation is for the evaluation of tremor.  Pt states that it has been going on over a year.  It is mostly in the R hand and rarely in the L hand.  "It is almost constant but not quite."  She notices it when grasping the steering wheel.  Eating was troublesome but better now that d/c the symbicort.  Symbicort worsened tremor by 80%.  Paternal aunt with tremor.  05/17/15 update:  Last visit, I diagnosed the patient with Parkinson's disease and started her on carbidopa/levodopa 25/100, one tablet 3 times per day.  She states that for the first 6 weeks she had diarrhea but it got better and it went away.  She does think that the tremor is better but does know that stress will trigger the tremor.  She also states that speech therapy seems to set off tremor.  She was started in physical therapy at [facility].  She has noted that she is waking up from dreams yelling and acting out dreams.  She does have a long history of migraine headaches and last visit had wanted me to prescribe Imitrex.  I was very nervous that her age and did not feel that she was likely a triptan candidate.  I ended up ordering an MRI of the brain on 02/28/2015.  I reviewed this and there was evidence of moderate small vessel disease.  I subsequently told her that she was not a candidate for the triptans.  She also had an MRI of the cervical spine on a performed, 2016.  There was mild multilevel cervical spondylosis.  Unfortunately, she has had several headaches since last visit that were fairly severe.  She did see Dr. [name] since last visit in regards to dysphagia and she states that it was scheduled but she ended up having to reschedule it.  08/23/15 update:  The patient is following up today.  She remains on carbidopa/levodopa 25/100, one tablet 3 times per day (8:30am/2pm/10PM).  She does have some  diarrhea that she associates with taking the pill but feels it is better than when she first started taking it.  She also states that it takes her until mid afternoon to feel normal and just feels slow in the morning.  She was started on clonazepam last visit for REM behavior disorder and that has helped dramatically.  She has had some spells of weakness in which her legs give out.  It may happen a few times a week.  She does not relate that to the addition of clonazepam at all.  No falls.  No hallucinations.  She was exercising until her back started hurting again.   In regards to her migraines, I gave her a prescription for Cambia last visit.  She is not a candidate for the triptans any longer.  She picked it up but hasn't had to use it yet.  She is on neurontin 600 mg bid for post surgical pain (states that she had mastectomy and open lung biopsy and then had terrible paresthesias).  Is still like that if doesn't take the neurontin, especially if she has a bra on.  She does complain that she thinks she is not thinking as clear as she used to.  11/28/14 update:  The patient is following up today.  Last visit, I increased her carbidopa/levodopa so that she is taking carbidopa/levodopa 25/100, 2  tablets in the morning, one in the afternoon and one in the evening.  I was hoping that this would help her morning "on."  Today, the patient states that she just has no energy and she is unsure if it is related to the medication; it started before the medication increase.  She thinks that the energy does get a bit better throughout the day.  She is on the klonopin and "I don't want to give that up as I don't dream anymore."  She has so little energy that she has not exercised much.  Does want to go back to PT at [facility].  She is not snoring at night that she knows of.  She's had no falls since last visit.  No hallucinations.  No lightheadedness or near syncope.  She has a has a history of rare migraine.  She does have  Cambia at home but has not had to use that.  She had a "bad" headache last night but got rid of it with tylenol, aleve, and coke.  Also c/o issues with BM's - both constipation and diarrhea.  03/01/16 update: The patient is following up today regarding her Parkinson's disease.  I have had the opportunity to review her prior records made available to me.  I last visit, she was complaining about loss of energy.  It was not sure that this was really related to her medications, but we decided to strategically hold medications.  She held her levodopa for about 4 days, because she thought that that was the culprit.  She felt no different in terms of energy off of the medication and ended up going back on the carbidopa/levodopa 25/100, but for some reason she went from 2 in the morning, one in the afternoon and one in the evening to 1 tablet three times a day (states that she didn't think that the higher dose made a difference).  We subsequently held her clonazepam and her energy may have been a little bit better, but she felt that it was certainly not worth being off of the medication, because she has terrible dreams and acting out the dreams off of the medications.  We decided subsequently to that selegiline for its amphetamine-like properties, but she decided not to take the medication after it was prescribed.  She reported that she wanted to try to increase her energy through exercise.  She is doing the BIG/LOUD exercises at home and walks some.  Brings in a [place] form for exercise today.  No hallucination but rare visual distortion.  She has had some headaches but hasn't tried the cambia.    06/01/16 update:  The patient is following up today.  Last visit, I encouraged the patient to increase her levodopa back to 2 tablets in the morning, one in the afternoon and one evening, because every time she decreased it she became more rigid.  She states that she did that and she is doing "okay."  She also has a history  of REM behavior disorder and is on half a tablet of clonazepam at nighttime.  She denies falls since last visit.  No hallucinations.  May have occasional lightheadedness but no near syncope.  Thinks that she has "muscle weakness" but points to the neck and states that it is painful and then will feel like the head will tumble off.  Asks me about "posture PT" at [facility] [place].    10/02/16 update:  Pt f/u today.  Pt is on carbidopa/levodopa 25/100, 2 in  the AM, 1 in the afternoon and 1 in the evening.  Still on klonopin 0.5 mg, 1/2 tablet q hs.  No falls since our last visit.  No hallucinations.  No increased lightheadedness.  No syncope.  Mood has been fair.  Doing at least 1.25 miles per day on her [place] for exercise.   PREVIOUS MEDICATIONS: none to date  ALLERGIES:   Allergies  Allergen Reactions  . Sulfonamide Derivatives Anaphylaxis  . Topamax [Topiramate] Other (See Comments)    Metabolic acidosis   . Biaxin [Clarithromycin] Other (See Comments)    pericarditis  . Hydrocodone Other (See Comments)    "hyper and climbing the walls"  . Motrin [Ibuprofen] Other (See Comments)    headaches  . Symbicort [Budesonide-Formoterol Fumarate]     02/07/15 tremor  . Percocet [Oxycodone-Acetaminophen] Itching and Rash  . Tape Itching and Rash    Use paper tape only  . Tetracyclines & Related Other (See Comments)    "immediate yeast infection"    CURRENT MEDICATIONS:  Outpatient Encounter Prescriptions as of 10/02/2016  Medication Sig  . acetaminophen (TYLENOL) 325 MG tablet Take 1,000 mg by mouth 2 (two) times daily as needed.   [name] aspirin 81 MG tablet Take 81 mg by mouth daily.    . carbidopa-levodopa ([name]) 25-100 MG tablet TAKE 2 TABLETS IN THE MORNING, 1 TABLET IN THE AFTERNOON, 1 IN THE EVENING  . clonazePAM (KLONOPIN) 0.5 MG tablet TAKE 0.5-1 TABLET BY MOUTH AT BEDTIME  . gabapentin ([name]) 300 MG capsule 2 tablets in the morning, 1 in the afternoon and 2 in the evening (Patient  taking differently: Take 600 mg by mouth 3 (three) times daily. )  . losartan (COZAAR) 50 MG tablet TAKE 1 TABLET EVERY DAY  . metoprolol tartrate (LOPRESSOR) 25 MG tablet TAKE 1 TABLET BY MOUTH TWICE A DAY  . Vitamin D, Ergocalciferol, ([name]) 50000 units CAPS capsule TAKE 1 CAPSULE BY MOUTH EVERY WEEK   No facility-administered encounter medications on file as of 10/02/2016.     PAST MEDICAL HISTORY:   Past Medical History:  Diagnosis Date  . 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 Aortic root aneurysm ([place])   . Breast cancer ([place]) 2002   infiltrative ductal carcinoma   . COPD (chronic obstructive pulmonary disease) ([place]) 9/08  . Ductal carcinoma of breast, estrogen receptor positive, stage 1 ([place]) 09/16/2012  . Endometriosis   . Hypercalcemia 09/14/2013  . Lesion of breast 1992   right, benign  . Lung disease    secondary to [name] infection  . Mastalgia 7/95  . Osteoporosis, post-menopausal 03/14/2012  . Ovarian cancer ([place]) 2004  . Pancreatitis    secondary to Cholelithiasis  . Parkinson disease ([place]) 02/10/15  . Post-thoracotomy pain syndrome   . Pseudomonas pneumonia ([place]) 5/09  . Uterine fibroid   . Vitamin D deficiency     PAST SURGICAL HISTORY:   Past Surgical History:  Procedure Laterality Date  . 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 APPENDECTOMY  2004  . BREAST BIOPSY  08/2004   right breast-benign  . BREAST LUMPECTOMY  1992   benign  . CARDIAC CATHETERIZATION  2007   essentially negation for significant CAD  . CHOLECYSTECTOMY  2001  . COLONOSCOPY  2014   Dr [name] , due 2019  . LUNG BIOPSY  2002   [name], Dr [name]  .  MASTECTOMY MODIFIED RADICAL  2002   oral chemotheraphy (tamoxifen then Armidex) no radiation, Dr.[name]  . TONSILLECTOMY  1946    SOCIAL HISTORY:   Social History   Social History  . Marital status: Widowed    Spouse name: N/A  . Number of children: 0  .  Years of education: N/A   Occupational History  . 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 Smoking status: Never Smoker  . Smokeless tobacco: Never Used  . Alcohol use No  . Drug use: No  . Sexual activity: No     Comment: TAH/BSO   Other Topics Concern  . 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 Father Deceased   lung cancer  . Sister Alive   colon/breast cancer  . Mother Deceased   emphysema  . Brother Alive   healthy    ROS:  A complete 10 system review of systems was obtained and was unremarkable apart from what is mentioned above.  PHYSICAL EXAMINATION:    VITALS:   Vitals:   10/02/16 1040  BP: 120/70  Pulse: 60  Weight: 143 lb (64.9 kg)  Height: 5\' 2"  (1.575 m)    GEN:  The patient appears stated age and is in NAD. HEENT:  Normocephalic, atraumatic.  The mucous membranes are moist. The superficial temporal arteries are without ropiness or tenderness. CV:  RRR Lungs:  CTAB Neck/HEME:  There are no carotid bruits bilaterally.  Neurological examination:  Orientation:  Montreal Cognitive Assessment  10/02/2016  Visuospatial/ Executive (0/5) 4  Naming (0/3) 3  Attention: Read list of digits (0/2) 2  Attention: Read list of letters (0/1) 1  Attention: Serial 7 subtraction starting at 100 (0/3) 3  Language: Repeat phrase (0/2) 2  Language : Fluency (0/1) 1  Abstraction (0/2) 2  Delayed Recall (0/5) 3  Orientation (0/6) 6  Total 27  Adjusted Score (based on education) 27   Cranial nerves: There is good facial symmetry with the exception of the fact that when asked to raise the eyebrows, the right eyebrow raises further than the left.  There is mild facial hypomimia. The speech is fluent and clear. Soft palate rises symmetrically and there is no tongue deviation. Hearing is intact to  conversational tone. Sensation: Sensation is intact to light touch throughout. Motor: Strength is 5/5 in the bilateral upper and lower extremities.   Shoulder shrug is equal and symmetric.  There is no pronator drift.   Movement examination: Tone: There is normal tone bilaterally  Abnormal movements: There is no tremor today (seen or felt) Coordination:  There is no decremation with any form of RAMS, including alternating supination and pronation of the forearm, hand opening and closing, finger taps, heel taps and toe taps. Gait and Station: The patient has no difficulty arising out of a deep-seated chair without the use of the hands. The patient's stride length is normal, and good arm swing  LABS  Lab Results  Component Value Date   WBC 6.0 03/28/2016   HGB 13.9 03/28/2016   HCT 42.7 03/28/2016   MCV 96.4 03/28/2016   PLT 175 03/28/2016     Chemistry      Component Value Date/Time   NA 140 03/28/2016 1028   K 3.8 03/28/2016 1028   CL  102 03/15/2016 1124   CL 102 03/10/2013 1529   CO2 25 03/28/2016 1028   BUN 15.0 03/28/2016 1028   CREATININE 0.9 03/28/2016 1028      Component Value Date/Time   CALCIUM 9.5 03/28/2016 1028   ALKPHOS 75 03/28/2016 1028   AST 18 03/28/2016 1028   ALT <9 03/28/2016 1028   BILITOT 1.12 03/28/2016 1028       ASSESSMENT/PLAN:  1.   idiopathic Parkinson's disease.  The patient has tremor, bradykinesia, rigidity and mild postural instability.  Sx's have been present for at least since March, 2015 by the patients account today.  She is Hoehn and Yahr stage 2-2.5.    -She looks better on carbidopa/levodopa 25/100,  2/1/1.  Will continue that  -encouraged her to look into the rock steady boxing that should be coming to [place] soon.  She lives there. 2.  Mild dysphagia  -The patient states that this is no longer a problem 3.  Long hx migraine  -No longer a candidate for triptan therapy.  This is due to cerebral small vessel disease.  She does  have Cambia for when necessary use.  Encouraged her to try it as she has been having more headaches.  Was nervous to try due to bad taste and told her that it is not nearly as bad as the headache feels. 4.  Neck pain/hyperreflexia  -MRI cervical spine in April, 2016 just demonstrated mild multilevel cervical spondylosis. 5.  REM behavior disorder  -Doing well with low-dose clonazepam, 0.5 mg, half tablet at night.  Discussed risks, benefits, side effects.  Understanding is expressed. 6.  Mild cognitive impairment  -Talked to her again about the fact that I saw no evidence of dementia today, although it can develop in the future.  She and I talked about the differences between mild cognitive impairment and Parkinson's related dementia.  Asked me about this last few visits.  [facility] is very good.  Think depression could play role here.  -talked about importance of mental and physical activities and importance of remaining social   7.  Follow-up in the next few months, sooner should new neurologic issues arise.  Much greater than 50% of this visit was spent in counseling and coordinating care.  Total face to face time:  25 min

## 2016-10-02 ENCOUNTER — Encounter: Payer: Self-pay | Admitting: Neurology

## 2016-10-02 ENCOUNTER — Ambulatory Visit (INDEPENDENT_AMBULATORY_CARE_PROVIDER_SITE_OTHER): Payer: Medicare Other | Admitting: Neurology

## 2016-10-02 VITALS — BP 120/70 | HR 60 | Ht 62.0 in | Wt 143.0 lb

## 2016-10-02 DIAGNOSIS — G3184 Mild cognitive impairment, so stated: Secondary | ICD-10-CM | POA: Diagnosis not present

## 2016-10-02 DIAGNOSIS — R413 Other amnesia: Secondary | ICD-10-CM | POA: Diagnosis not present

## 2016-10-02 DIAGNOSIS — G20 Parkinson's disease: Secondary | ICD-10-CM | POA: Diagnosis not present

## 2016-10-15 ENCOUNTER — Telehealth: Payer: Self-pay | Admitting: *Deleted

## 2016-10-15 NOTE — Telephone Encounter (Signed)
Spoke with Wells Guiles at [facility] -calling to notify Dr. Sabra Heck of addendum to 02/23/15 report. Advised as seen below.  ADDENDUM REPORT: 10/15/2016 11:46  ADDENDUM: Addendum for this 02/23/2015 chest CT requested on 10/15/2016 for measurement of the aorta. Maximal AP diameter of the ascending aorta is 3.9 cm. No focal aneurysm or displaced intimal calcification demonstrated on noncontrast imaging. There is mild aortic atherosclerosis.   Electronically Signed   By: Richardean Sale M.D.   On: 10/15/2016 11:46

## 2016-10-17 NOTE — Telephone Encounter (Signed)
Spoke with patient. Advised as seen below per Dr. Sabra Heck. Patient states she would like a little time to think about this and return call with decision. Advised patient I will update Dr. Sabra Heck of our conversation and she may return call to [phone]. Patient thankful for call and verbalizes understanding.

## 2016-10-17 NOTE — Telephone Encounter (Signed)
Can you please let pt know this information.  The CT that was add ended was from 2016.  From the prior CT, her aneurysm increased by 0.1cm.  Risk of rupture is essentially 0 when less then 4.0cm.  However, it did change from the prior CT to more recent one.    This is very much out of my area of expertise.  We can refer to cardiovascular surgeon for review of records and additional recommendations.  Is she comfortable with this plan?

## 2016-10-23 ENCOUNTER — Other Ambulatory Visit: Payer: Self-pay | Admitting: Family Medicine

## 2016-12-13 ENCOUNTER — Other Ambulatory Visit: Payer: Self-pay | Admitting: Neurology

## 2016-12-14 NOTE — Telephone Encounter (Signed)
RX refill sent, per last OV note patient to continue.  

## 2016-12-25 ENCOUNTER — Telehealth: Payer: Self-pay | Admitting: Family Medicine

## 2016-12-25 NOTE — Telephone Encounter (Signed)
Yes ok to refill enough to get her to her appt.

## 2016-12-25 NOTE — Telephone Encounter (Signed)
Patient called to schedule awv and physical.  Patient will run out of Metoprolol before her physical with Dr.Aron on 12/31/16.  Can rx be called in to [facility], so she'll have enough until her appointment?  Please call patient back to let her know.

## 2016-12-26 MED ORDER — METOPROLOL TARTRATE 25 MG PO TABS: 25.0000 mg | ORAL_TABLET | Freq: Two times a day (BID) | ORAL | 0 refills | Status: DC

## 2016-12-27 ENCOUNTER — Ambulatory Visit (INDEPENDENT_AMBULATORY_CARE_PROVIDER_SITE_OTHER): Payer: Medicare Other

## 2016-12-27 ENCOUNTER — Other Ambulatory Visit: Payer: Self-pay | Admitting: Family Medicine

## 2016-12-27 VITALS — BP 110/80 | HR 66 | Temp 97.5°F | Ht 62.5 in | Wt 145.0 lb

## 2016-12-27 DIAGNOSIS — I10 Essential (primary) hypertension: Secondary | ICD-10-CM

## 2016-12-27 DIAGNOSIS — Z01419 Encounter for gynecological examination (general) (routine) without abnormal findings: Secondary | ICD-10-CM

## 2016-12-27 DIAGNOSIS — Z0000 Encounter for general adult medical examination without abnormal findings: Secondary | ICD-10-CM

## 2016-12-27 DIAGNOSIS — E782 Mixed hyperlipidemia: Secondary | ICD-10-CM | POA: Diagnosis not present

## 2016-12-27 DIAGNOSIS — Z0001 Encounter for general adult medical examination with abnormal findings: Secondary | ICD-10-CM | POA: Insufficient documentation

## 2016-12-27 LAB — COMPREHENSIVE METABOLIC PANEL
ALK PHOS: 77 U/L (ref 39–117)
ALT: 27 U/L (ref 0–35)
AST: 12 U/L (ref 0–37)
Albumin: 4.2 g/dL (ref 3.5–5.2)
BILIRUBIN TOTAL: 1.3 mg/dL — AB (ref 0.2–1.2)
BUN: 13 mg/dL (ref 6–23)
CO2: 32 mEq/L (ref 19–32)
Calcium: 9.4 mg/dL (ref 8.4–10.5)
Chloride: 101 mEq/L (ref 96–112)
Creatinine, Ser: 0.86 mg/dL (ref 0.40–1.20)
GFR: 68.6 mL/min (ref >60.00)
GLUCOSE: 96 mg/dL (ref 70–99)
Potassium: 3.7 mEq/L (ref 3.5–5.1)
Sodium: 139 mEq/L (ref 135–145)
TOTAL PROTEIN: 6.9 g/dL (ref 6.0–8.3)

## 2016-12-27 LAB — CBC WITH DIFFERENTIAL/PLATELET
BASOS ABS: 0 10*3/uL (ref 0.0–0.1)
Basophils Relative: 0.6 % (ref 0.0–3.0)
Eosinophils Absolute: 0.3 10*3/uL (ref 0.0–0.7)
Eosinophils Relative: 4.2 % (ref 0.0–5.0)
HCT: 42.8 % (ref 36.0–46.0)
Hemoglobin: 14.3 g/dL (ref 12.0–15.0)
LYMPHS ABS: 1.7 10*3/uL (ref 0.7–4.0)
LYMPHS PCT: 26.7 % (ref 12.0–46.0)
MCHC: 33.4 g/dL (ref 30.0–36.0)
MCV: 93.9 fl (ref 78.0–100.0)
MONOS PCT: 10.3 % (ref 3.0–12.0)
Monocytes Absolute: 0.6 10*3/uL (ref 0.1–1.0)
NEUTROS PCT: 58.2 % (ref 43.0–77.0)
Neutro Abs: 3.6 10*3/uL (ref 1.4–7.7)
Platelets: 165 10*3/uL (ref 150.0–400.0)
RBC: 4.56 Mil/uL (ref 3.87–5.11)
RDW: 14.1 % (ref 11.5–15.5)
WBC: 6.2 10*3/uL (ref 4.0–10.5)

## 2016-12-27 LAB — LIPID PANEL
Cholesterol: 208 mg/dL — ABNORMAL HIGH (ref 0–200)
HDL: 52.5 mg/dL (ref >39.00)
LDL Cholesterol: 129 mg/dL — ABNORMAL HIGH (ref 0–99)
NonHDL: 155.57
Total CHOL/HDL Ratio: 4
Triglycerides: 131 mg/dL (ref 0.0–149.0)
VLDL: 26.2 mg/dL (ref 0.0–40.0)

## 2016-12-27 LAB — TSH: TSH: 1.18 u[IU]/mL (ref 0.35–4.50)

## 2016-12-27 NOTE — Progress Notes (Signed)
PCP notes:   Health maintenance:  No gaps identified.   Abnormal screenings:   Hearing - failed Depression score: 1  Patient concerns:   None  Nurse concerns:  None  Next PCP appt:   12/31/16 @ 0915

## 2016-12-27 NOTE — Progress Notes (Signed)
   Subjective:    Patient ID: Crystal Bass, female    DOB: 07-May-1941, 76 y.o.   MRN: [id number]  HPI  I reviewed health advisor's note, was available for consultation, and agree with documentation and plan.   Review of Systems     Objective:   Physical Exam        Assessment & Plan:

## 2016-12-27 NOTE — Progress Notes (Signed)
Pre visit review using our clinic review tool, if applicable. No additional management support is needed unless otherwise documented below in the visit note. 

## 2016-12-27 NOTE — Patient Instructions (Signed)
Ms. Hundt , Thank you for taking time to come for your Medicare Wellness Visit. I appreciate your ongoing commitment to your health goals. Please review the following plan we discussed and let me know if I can assist you in the future.   These are the goals we discussed: Goals    . Increase physical activity          Starting 12/27/16, I will continue to exercise at least 30 min 5 days per week.        This is a list of the screening recommended for you and due dates:  Health Maintenance  Topic Date Due  . Flu Shot  02/23/2017*  . Colon Cancer Screening  03/03/2018  . Mammogram  06/18/2018  . Tetanus Vaccine  01/01/2021  . DEXA scan (bone density measurement)  Completed  . Shingles Vaccine  Completed  . Pneumonia vaccines  Completed  *Topic was postponed. The date shown is not the original due date.   Preventive Care for Adults  A healthy lifestyle and preventive care can promote health and wellness. Preventive health guidelines for adults include the following key practices.  . A routine yearly physical is a good way to check with your health care provider about your health and preventive screening. It is a chance to share any concerns and updates on your health and to receive a thorough exam.  . Visit your dentist for a routine exam and preventive care every 6 months. Brush your teeth twice a day and floss once a day. Good oral hygiene prevents tooth decay and gum disease.  . The frequency of eye exams is based on your age, health, family medical history, use  of contact lenses, and other factors. Follow your health care provider's ecommendations for frequency of eye exams.  . Eat a healthy diet. Foods like vegetables, fruits, whole grains, low-fat dairy products, and lean protein foods contain the nutrients you need without too many calories. Decrease your intake of foods high in solid fats, added sugars, and salt. Eat the right amount of calories for you. Get information  about a proper diet from your health care provider, if necessary.  . Regular physical exercise is one of the most important things you can do for your health. Most adults should get at least 150 minutes of moderate-intensity exercise (any activity that increases your heart rate and causes you to sweat) each week. In addition, most adults need muscle-strengthening exercises on 2 or more days a week.  Silver Sneakers may be a benefit available to you. To determine eligibility, you may visit the website: www.silversneakers.com or contact program at 210-251-9060 Mon-Fri between 8AM-8PM.   . Maintain a healthy weight. The body mass index (BMI) is a screening tool to identify possible weight problems. It provides an estimate of body fat based on height and weight. Your health care provider can find your BMI and can help you achieve or maintain a healthy weight.   For adults 20 years and older: ? A BMI below 18.5 is considered underweight. ? A BMI of 18.5 to 24.9 is normal. ? A BMI of 25 to 29.9 is considered overweight. ? A BMI of 30 and above is considered obese.   . Maintain normal blood lipids and cholesterol levels by exercising and minimizing your intake of saturated fat. Eat a balanced diet with plenty of fruit and vegetables. Blood tests for lipids and cholesterol should begin at age 37 and be repeated every 5 years. If your lipid  or cholesterol levels are high, you are over 50, or you are at high risk for heart disease, you may need your cholesterol levels checked more frequently. Ongoing high lipid and cholesterol levels should be treated with medicines if diet and exercise are not working.  . If you smoke, find out from your health care provider how to quit. If you do not use tobacco, please do not start.  . If you choose to drink alcohol, please do not consume more than 2 drinks per day. One drink is considered to be 12 ounces (355 mL) of beer, 5 ounces (148 mL) of wine, or 1.5 ounces (44  mL) of liquor.  . If you are 75-23 years old, ask your health care provider if you should take aspirin to prevent strokes.  . Use sunscreen. Apply sunscreen liberally and repeatedly throughout the day. You should seek shade when your shadow is shorter than you. Protect yourself by wearing long sleeves, pants, a wide-brimmed hat, and sunglasses year round, whenever you are outdoors.  . Once a month, do a whole body skin exam, using a mirror to look at the skin on your back. Tell your health care provider of new moles, moles that have irregular borders, moles that are larger than a pencil eraser, or moles that have changed in shape or color.

## 2016-12-27 NOTE — Progress Notes (Signed)
Subjective:   Crystal Bass is a 76 y.o. female who presents for Medicare Annual (Subsequent) preventive examination.  Review of Systems:  N/A Cardiac Risk Factors include: advanced age (>21men, >71 women);dyslipidemia;hypertension     Objective:     Vitals: BP 110/80 (BP Location: Right Arm, Patient Position: Sitting, Cuff Size: Normal)   Pulse 66   Temp 97.5 F (36.4 C) (Oral)   Ht 5' 2.5" (1.588 m)   Wt 145 lb (65.8 kg)   SpO2 96%   BMI 26.10 kg/m   Body mass index is 26.1 kg/m.   Tobacco History  Smoking Status  . Never Smoker  Smokeless Tobacco  . Never Used     Counseling given: No   Past Medical History:  Diagnosis Date  . 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 Aortic root aneurysm ([place])   . Breast cancer ([place]) 2002   infiltrative ductal carcinoma   . COPD (chronic obstructive pulmonary disease) ([place]) 9/08  . Ductal carcinoma of breast, estrogen receptor positive, stage 1 ([place]) 09/16/2012  . Endometriosis   . Hypercalcemia 09/14/2013  . Lesion of breast 1992   right, benign  . Lung disease    secondary to [name] infection  . Mastalgia 7/95  . Osteoporosis, post-menopausal 03/14/2012  . Ovarian cancer ([place]) 2004  . Pancreatitis    secondary to Cholelithiasis  . Parkinson disease ([place]) 02/10/15  . Post-thoracotomy pain syndrome   . Pseudomonas pneumonia ([place]) 5/09  . Uterine fibroid   . Vitamin D deficiency    Past Surgical History:  Procedure Laterality Date  . 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 APPENDECTOMY  2004  . BREAST BIOPSY  08/2004   right breast-benign  . BREAST LUMPECTOMY  1992   benign  . CARDIAC CATHETERIZATION  2007   essentially negation for significant CAD  . CHOLECYSTECTOMY  2001  . COLONOSCOPY  2014   Dr [name] , due 2019  . LUNG BIOPSY  2002   [name], Dr [name]  . MASTECTOMY MODIFIED RADICAL  2002   oral chemotheraphy (tamoxifen then Armidex) no  radiation, Dr.[name]  . TONSILLECTOMY  1946   Family History  Problem Relation Age of Onset  . Lung cancer Father   . Breast cancer Sister 69  . Colon cancer Sister   . Emphysema Mother     was never a smoker  . Stroke Paternal Aunt     in mid 26s  . Osteoporosis Paternal Aunt   . Myasthenia gravis Paternal Aunt   . Diabetes Neg Hx   . Heart disease Neg Hx    History  Sexual Activity  . Sexual activity: No    Comment: TAH/BSO    Outpatient Encounter Prescriptions as of 12/27/2016  Medication Sig  . acetaminophen (TYLENOL) 325 MG tablet Take 1,000 mg by mouth 2 (two) times daily as needed.   [name] aspirin 81 MG tablet Take 81 mg by mouth daily.    . carbidopa-levodopa ([name]) 25-100 MG tablet TAKE 2 TABLETS IN THE MORNING, 1 TABLET IN THE AFTERNOON, 1 IN THE EVENING  . clonazePAM (KLONOPIN) 0.5 MG tablet TAKE 0.5-1 TABLET BY MOUTH AT BEDTIME  . gabapentin ([name]) 300 MG capsule 2 tablets in the morning, 1 in the afternoon and 2 in the evening (Patient taking differently: Take 600 mg by mouth 3 (three) times daily. )  .  losartan (COZAAR) 50 MG tablet TAKE 1 TABLET EVERY DAY  . metoprolol tartrate (LOPRESSOR) 25 MG tablet Take 1 tablet (25 mg total) by mouth 2 (two) times daily. COMPLETE PHYSICAL EXAM REQUIRED FOR ADDITIONAL REFILLS  . Vitamin D, Ergocalciferol, ([name]) 50000 units CAPS capsule TAKE 1 CAPSULE BY MOUTH EVERY WEEK   No facility-administered encounter medications on file as of 12/27/2016.     Activities of Daily Living In your present state of health, do you have any difficulty performing the following activities: 12/27/2016  Hearing? N  Vision? Y  Difficulty concentrating or making decisions? N  Walking or climbing stairs? Y  Dressing or bathing? N  Doing errands, shopping? N  Preparing Food and eating ? N  Using the Toilet? N  In the past six months, have you accidently leaked urine? N  Do you have problems with loss of bowel control? N  Managing  your Medications? N  Managing your Finances? N  Housekeeping or managing your Housekeeping? N  Some recent data might be hidden    Patient Care Team: Lucille Passy, MD as PCP - General (Family Medicine) Mosetta Anis, MD as Referring Physician (Allergy) Megan Salon, MD as Consulting Physician (Gynecology) Ludwig Clarks, DO as Consulting Physician (Neurology) Nicholas Lose, MD as Consulting Physician (Hematology and Oncology)    Assessment:     Hearing Screening   125Hz  250Hz  500Hz  1000Hz  2000Hz  3000Hz  4000Hz  6000Hz  8000Hz   Right ear:   40 40 40  40    Left ear:   0 40 40  40    Vision Screening Comments: Last vision exam in 2017 with Dr. Francie Massing ' Exercise Activities and Dietary recommendations Current Exercise Habits: Home exercise routine, Type of exercise: walking;Other - see comments (Nu-Step), Time (Minutes): 30, Frequency (Times/Week): 5, Weekly Exercise (Minutes/Week): 150, Intensity: Mild, Exercise limited by: None identified  Goals    . Increase physical activity          Starting 12/27/16, I will continue to exercise at least 30 min 5 days per week.       Fall Risk Fall Risk  12/27/2016 10/02/2016 05/31/2016 02/07/2015 04/27/2013  Falls in the past year? No No No No No   Depression Screen PHQ 2/9 Scores 12/27/2016 02/07/2015 04/27/2013  PHQ - 2 Score 1 0 0     Cognitive Function MMSE - Mini Mental State Exam 12/27/2016  Orientation to time 5  Orientation to Place 5  Registration 3  Attention/ Calculation 0  Recall 3  Language- name 2 objects 0  Language- repeat 1  Language- follow 3 step command 3  Language- read & follow direction 0  Write a sentence 0  Copy design 0  Total score 20   Montreal Cognitive Assessment  10/02/2016  Visuospatial/ Executive (0/5) 4  Naming (0/3) 3  Attention: Read list of digits (0/2) 2  Attention: Read list of letters (0/1) 1  Attention: Serial 7 subtraction starting at 100 (0/3) 3  Language: Repeat phrase (0/2) 2  Language :  Fluency (0/1) 1  Abstraction (0/2) 2  Delayed Recall (0/5) 3  Orientation (0/6) 6  Total 27  Adjusted Score (based on education) 27     PLEASE NOTE: A Mini-Cog screen was completed. Maximum score is 20. A value of 0 denotes this part of Folstein MMSE was not completed or the patient failed this part of the Mini-Cog screening.   Mini-Cog Screening Orientation to Time - Max 5 pts Orientation to Place -  Max 5 pts Registration - Max 3 pts Recall - Max 3 pts Language Repeat - Max 1 pts Language Follow 3 Step Command - Max 3 pts   Immunization History  Administered Date(s) Administered  . Influenza Split 09/11/2011  . Influenza Whole 09/09/2007, 08/08/2008, 08/15/2010, 08/26/2012  . Influenza,inj,Quad PF,36+ Mos 08/18/2013, 08/20/2014  . Influenza-Unspecified 09/27/2015  . Pneumococcal Conjugate-13 02/12/2014  . Pneumococcal Polysaccharide-23 11/26/2006, 10/21/2012  . Td 01/01/2011  . Zoster 05/26/2007   Screening Tests Health Maintenance  Topic Date Due  . INFLUENZA VACCINE  02/23/2017 (Originally 06/26/2016)  . COLONOSCOPY  03/03/2018  . MAMMOGRAM  06/18/2018  . TETANUS/TDAP  01/01/2021  . DEXA SCAN  Completed  . ZOSTAVAX  Completed  . PNA vac Low Risk Adult  Completed      Plan:     I have personally reviewed and addressed the Medicare Annual Wellness questionnaire and have noted the following in the patient's chart:  A. Medical and social history B. Use of alcohol, tobacco or illicit drugs  C. Current medications and supplements D. Functional ability and status E.  Nutritional status F.  Physical activity G. Advance directives H. List of other physicians I.  Hospitalizations, surgeries, and ER visits in previous 12 months J.  [place] to include hearing, vision, cognitive, depression L. Referrals and appointments - none  In addition, I have reviewed and discussed with patient certain preventive protocols, quality metrics, and best practice  recommendations. A written personalized care plan for preventive services as well as general preventive health recommendations were provided to patient.  See attached scanned questionnaire for additional information.   Signed,   Lindell Noe, MHA, BS, LPN Health Coach

## 2016-12-31 ENCOUNTER — Encounter: Payer: Self-pay | Admitting: Family Medicine

## 2016-12-31 ENCOUNTER — Ambulatory Visit (INDEPENDENT_AMBULATORY_CARE_PROVIDER_SITE_OTHER): Payer: Medicare Other | Admitting: Family Medicine

## 2016-12-31 VITALS — BP 120/68 | HR 57 | Temp 97.5°F | Ht 62.5 in | Wt 144.5 lb

## 2016-12-31 DIAGNOSIS — I10 Essential (primary) hypertension: Secondary | ICD-10-CM

## 2016-12-31 DIAGNOSIS — I719 Aortic aneurysm of unspecified site, without rupture: Secondary | ICD-10-CM

## 2016-12-31 DIAGNOSIS — G20 Parkinson's disease: Secondary | ICD-10-CM | POA: Diagnosis not present

## 2016-12-31 DIAGNOSIS — C50412 Malignant neoplasm of upper-outer quadrant of left female breast: Secondary | ICD-10-CM

## 2016-12-31 DIAGNOSIS — Z0000 Encounter for general adult medical examination without abnormal findings: Secondary | ICD-10-CM | POA: Diagnosis not present

## 2016-12-31 DIAGNOSIS — Z66 Do not resuscitate: Secondary | ICD-10-CM

## 2016-12-31 DIAGNOSIS — Z01419 Encounter for gynecological examination (general) (routine) without abnormal findings: Secondary | ICD-10-CM

## 2016-12-31 DIAGNOSIS — J449 Chronic obstructive pulmonary disease, unspecified: Secondary | ICD-10-CM

## 2016-12-31 MED ORDER — METOPROLOL TARTRATE 25 MG PO TABS: 25.0000 mg | ORAL_TABLET | Freq: Two times a day (BID) | ORAL | 2 refills | Status: DC

## 2016-12-31 NOTE — Assessment & Plan Note (Signed)
Followed by neuro, on sinemet.

## 2016-12-31 NOTE — Assessment & Plan Note (Signed)
Reviewed preventive care protocols, scheduled due services, and updated immunizations Discussed nutrition, exercise, diet, and healthy lifestyle.  

## 2016-12-31 NOTE — Assessment & Plan Note (Signed)
Followed by oncology. Mammogram UTD.

## 2016-12-31 NOTE — Progress Notes (Signed)
Subjective:   Patient ID: Crystal Bass, female    DOB: 12/24/1940, 76 y.o.   MRN: [id number]  Crystal Bass is a pleasant 76 y.o. year old female who presents to clinic today with Annual Exam  on 12/31/2016  HPI:  Annual medicare wellness visit done by Crystal Musa, RN on 12/27/16. Note reviewed.  No concerns noted.  Colonoscopy 03/03/13  Has GYN, Dr. Hale Bass. Saw her on 07/21/16. Note reviewed. On Zometa very 6 months for osteoporosis. CA 125 was checked.  Parkinson's disease- sees Dr. Carles Bass.  Was last seen 10/02/2016- note reviewed. Referred to physical therapy which she has been attending- most recently yesterday, 01/09/16. No changes made to rxs.  She does have a long history of asthma/COPD but does not use inhalers regularly because it worsens her tremor.  She does have a rescue inhaler that she will use "if she gets into trouble."  She is a DNR and would like to remain one- has yellow sheet on her refrigerator at home.  HTN- has been taking Cozaar 50 mg daily and lopressor 25 mg twice daily for years.  Lab Results  Component Value Date   CREATININE 0.86 12/27/2016    Breast cancer- 2002- left mastectomy and arimidex. Mammogram 06/18/16, has the next one scheduled.  Lab Results  Component Value Date   CHOL 208 (H) 12/27/2016   HDL 52.50 12/27/2016   LDLCALC 129 (H) 12/27/2016   LDLDIRECT 135.0 08/16/2009   TRIG 131.0 12/27/2016   CHOLHDL 4 12/27/2016   Lab Results  Component Value Date   ALT 27 12/27/2016   AST 12 12/27/2016   ALKPHOS 77 12/27/2016   BILITOT 1.3 (H) 12/27/2016   Lab Results  Component Value Date   NA 139 12/27/2016   K 3.7 12/27/2016   CL 101 12/27/2016   CO2 32 12/27/2016   Lab Results  Component Value Date   TSH 1.18 12/27/2016      Current Outpatient Prescriptions on File Prior to Visit  Medication Sig Dispense Refill  . acetaminophen (TYLENOL) 325 MG tablet Take 1,000 mg by mouth 2 (two) times daily as needed.      [name] aspirin 81 MG tablet Take 81 mg by mouth daily.      . carbidopa-levodopa ([name]) 25-100 MG tablet TAKE 2 TABLETS IN THE MORNING, 1 TABLET IN THE AFTERNOON, 1 IN THE EVENING 360 tablet 1  . clonazePAM (KLONOPIN) 0.5 MG tablet TAKE 0.5-1 TABLET BY MOUTH AT BEDTIME 90 tablet 0  . gabapentin ([name]) 300 MG capsule 2 tablets in the morning, 1 in the afternoon and 2 in the evening (Patient taking differently: Take 600 mg by mouth 3 (three) times daily. ) 450 capsule 3  . losartan (COZAAR) 50 MG tablet TAKE 1 TABLET EVERY DAY 90 tablet 3  . Vitamin D, Ergocalciferol, ([name]) 50000 units CAPS capsule TAKE 1 CAPSULE BY MOUTH EVERY WEEK 12 capsule 4   No current facility-administered medications on file prior to visit.     Allergies  Allergen Reactions  . Sulfonamide Derivatives Anaphylaxis  . Topamax [Topiramate] Other (See Comments)    Metabolic acidosis   . Biaxin [Clarithromycin] Other (See Comments)    pericarditis  . Hydrocodone Other (See Comments)    "hyper and climbing the walls"  . Motrin [Ibuprofen] Other (See Comments)    headaches  . Symbicort [Budesonide-Formoterol Fumarate]     02/07/15 tremor  . Percocet [Oxycodone-Acetaminophen] Itching and Rash  . Tape Itching and Rash  Use paper tape only  . Tetracyclines & Related Other (See Comments)    "immediate yeast infection"    Past Medical History:  Diagnosis Date  . 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 Aortic root aneurysm ([place])   . Breast cancer ([place]) 2002   infiltrative ductal carcinoma   . COPD (chronic obstructive pulmonary disease) ([place]) 9/08  . Ductal carcinoma of breast, estrogen receptor positive, stage 1 ([place]) 09/16/2012  . Endometriosis   . Hypercalcemia 09/14/2013  . Lesion of breast 1992   right, benign  . Lung disease    secondary to [name] infection  . Mastalgia 7/95  . Osteoporosis, post-menopausal 03/14/2012  . Ovarian cancer ([place]) 2004  . Pancreatitis     secondary to Cholelithiasis  . Parkinson disease ([place] Bass) 02/10/15  . Post-thoracotomy pain syndrome   . Pseudomonas pneumonia ([place]) 5/09  . Uterine fibroid   . Vitamin D deficiency     Past Surgical History:  Procedure Laterality Date  . 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 APPENDECTOMY  2004  . BREAST BIOPSY  08/2004   right breast-benign  . BREAST LUMPECTOMY  1992   benign  . CARDIAC CATHETERIZATION  2007   essentially negation for significant CAD  . CHOLECYSTECTOMY  2001  . COLONOSCOPY  2014   Dr [name] , due 2019  . LUNG BIOPSY  2002   [name], Dr [name]  . MASTECTOMY MODIFIED RADICAL  2002   oral chemotheraphy (tamoxifen then Armidex) no radiation, Dr.[name]  . TONSILLECTOMY  1946    Family History  Problem Relation Age of Onset  . Lung cancer Father   . Breast cancer Sister 52  . Colon cancer Sister   . Emphysema Mother     was never a smoker  . Stroke Paternal Aunt     in mid 58s  . Osteoporosis Paternal Aunt   . Myasthenia gravis Paternal Aunt   . Diabetes Neg Hx   . Heart disease Neg Hx     Social History   Social History  . Marital status: Widowed    Spouse name: N/A  . Number of children: 0  . Years of education: N/A   Occupational History  . 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 Smoking status: Never Smoker  . Smokeless tobacco: Never Used  . Alcohol use No  . Drug use: No  . Sexual activity: No     Comment: TAH/BSO   Other Topics Concern  . 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   Review of Systems  Constitutional: Negative.   HENT: Negative.   Eyes: Negative.   Respiratory: Negative.   Cardiovascular: Negative.   Gastrointestinal:  Negative.   Endocrine: Negative.   Genitourinary: Negative.   Musculoskeletal: Negative.   Skin: Negative.   Allergic/Immunologic: Negative.   Neurological: Positive for tremors. Negative for dizziness, seizures, syncope, facial asymmetry, speech difficulty, weakness, light-headedness, numbness and headaches.  Hematological: Negative.   Psychiatric/Behavioral: Negative.   All other systems reviewed and are negative.      Objective:    BP 120/68   Pulse [name])  57   Temp 97.5 F (36.4 C) (Oral)   Ht 5' 2.5" (1.588 m)   Wt 144 lb 8 oz (65.5 kg)   SpO2 93%   BMI 26.01 kg/m   Wt Readings from Last 3 Encounters:  12/31/16 144 lb 8 oz (65.5 kg)  12/27/16 145 lb (65.8 kg)  10/02/16 143 lb (64.9 kg)    Physical Exam   General:  Well-developed,well-nourished,in no acute distress; alert,appropriate and cooperative throughout examination Head:  normocephalic and atraumatic.   Eyes:  vision grossly intact, PERRL Ears:  R ear normal and L ear normal externally, TMs clear bilaterally Nose:  no external deformity.   Mouth:  good dentition.   Neck:  No deformities, masses, or tenderness noted. Lungs:  Normal respiratory effort, chest expands symmetrically. Lungs are clear to auscultation, no crackles or wheezes. Heart:  Normal rate and regular rhythm. S1 and S2 normal without gallop, murmur, click, rub or other extra sounds. Abdomen:  Bowel sounds positive,abdomen soft and non-tender without masses, organomegaly or hernias noted. Msk:  No deformity or scoliosis noted of thoracic or lumbar spine.   Extremities:  No clubbing, cyanosis, edema, or deformity noted with normal full range of motion of all joints.   Neurologic:  alert & oriented X3 and gait normal.   +tremor Skin:  Intact without suspicious lesions or rashes Cervical Nodes:  No lymphadenopathy noted Axillary Nodes:  No palpable lymphadenopathy Psych:  Cognition and judgment appear intact. Alert and cooperative with normal  attention span and concentration. No apparent delusions, illusions, hallucinations       Assessment & Plan:   Well woman exam  DNR (do not resuscitate)  Malignant neoplasm of upper-outer quadrant of left female breast, unspecified estrogen receptor status ([place])  Parkinson disease ([place])  Chronic obstructive airway disease with asthma ([place])  Essential hypertension  Aortic aneurysm without rupture, unspecified portion of aorta ([place]) No Follow-up on file.

## 2016-12-31 NOTE — Progress Notes (Signed)
Pre visit review using our clinic review tool, if applicable. No additional management support is needed unless otherwise documented below in the visit note. 

## 2016-12-31 NOTE — Patient Instructions (Signed)
Great to see you.  I hope you have a happy birthday!   

## 2016-12-31 NOTE — Assessment & Plan Note (Signed)
Well controlled. No changes made to rxs. 

## 2017-01-28 NOTE — Progress Notes (Signed)
Crystal Bass was seen today in the movement disorders clinic for neurologic consultation at the request of [name], MD.  The consultation is for the evaluation of tremor.  Pt states that it has been going on over a year.  It is mostly in the R hand and rarely in the L hand.  "It is almost constant but not quite."  She notices it when grasping the steering wheel.  Eating was troublesome but better now that d/c the symbicort.  Symbicort worsened tremor by 80%.  Paternal aunt with tremor.  05/17/15 update:  Last visit, I diagnosed the patient with Parkinson's disease and started her on carbidopa/levodopa 25/100, one tablet 3 times per day.  She states that for the first 6 weeks she had diarrhea but it got better and it went away.  She does think that the tremor is better but does know that stress will trigger the tremor.  She also states that speech therapy seems to set off tremor.  She was started in physical therapy at [facility].  She has noted that she is waking up from dreams yelling and acting out dreams.  She does have a long history of migraine headaches and last visit had wanted me to prescribe Imitrex.  I was very nervous that her age and did not feel that she was likely a triptan candidate.  I ended up ordering an MRI of the brain on 02/28/2015.  I reviewed this and there was evidence of moderate small vessel disease.  I subsequently told her that she was not a candidate for the triptans.  She also had an MRI of the cervical spine on a performed, 2016.  There was mild multilevel cervical spondylosis.  Unfortunately, she has had several headaches since last visit that were fairly severe.  She did see Dr. [name] since last visit in regards to dysphagia and she states that it was scheduled but she ended up having to reschedule it.  08/23/15 update:  The patient is following up today.  She remains on carbidopa/levodopa 25/100, one tablet 3 times per day (8:30am/2pm/10PM).  She does have some  diarrhea that she associates with taking the pill but feels it is better than when she first started taking it.  She also states that it takes her until mid afternoon to feel normal and just feels slow in the morning.  She was started on clonazepam last visit for REM behavior disorder and that has helped dramatically.  She has had some spells of weakness in which her legs give out.  It may happen a few times a week.  She does not relate that to the addition of clonazepam at all.  No falls.  No hallucinations.  She was exercising until her back started hurting again.   In regards to her migraines, I gave her a prescription for Cambia last visit.  She is not a candidate for the triptans any longer.  She picked it up but hasn't had to use it yet.  She is on neurontin 600 mg bid for post surgical pain (states that she had mastectomy and open lung biopsy and then had terrible paresthesias).  Is still like that if doesn't take the neurontin, especially if she has a bra on.  She does complain that she thinks she is not thinking as clear as she used to.  11/28/14 update:  The patient is following up today.  Last visit, I increased her carbidopa/levodopa so that she is taking carbidopa/levodopa 25/100, 2  tablets in the morning, one in the afternoon and one in the evening.  I was hoping that this would help her morning "on."  Today, the patient states that she just has no energy and she is unsure if it is related to the medication; it started before the medication increase.  She thinks that the energy does get a bit better throughout the day.  She is on the klonopin and "I don't want to give that up as I don't dream anymore."  She has so little energy that she has not exercised much.  Does want to go back to PT at [facility].  She is not snoring at night that she knows of.  She's had no falls since last visit.  No hallucinations.  No lightheadedness or near syncope.  She has a has a history of rare migraine.  She does have  Cambia at home but has not had to use that.  She had a "bad" headache last night but got rid of it with tylenol, aleve, and coke.  Also c/o issues with BM's - both constipation and diarrhea.  03/01/16 update: The patient is following up today regarding her Parkinson's disease.  I have had the opportunity to review her prior records made available to me.  I last visit, she was complaining about loss of energy.  It was not sure that this was really related to her medications, but we decided to strategically hold medications.  She held her levodopa for about 4 days, because she thought that that was the culprit.  She felt no different in terms of energy off of the medication and ended up going back on the carbidopa/levodopa 25/100, but for some reason she went from 2 in the morning, one in the afternoon and one in the evening to 1 tablet three times a day (states that she didn't think that the higher dose made a difference).  We subsequently held her clonazepam and her energy may have been a little bit better, but she felt that it was certainly not worth being off of the medication, because she has terrible dreams and acting out the dreams off of the medications.  We decided subsequently to that selegiline for its amphetamine-like properties, but she decided not to take the medication after it was prescribed.  She reported that she wanted to try to increase her energy through exercise.  She is doing the BIG/LOUD exercises at home and walks some.  Brings in a [facility] form for exercise today.  No hallucination but rare visual distortion.  She has had some headaches but hasn't tried the cambia.    06/01/16 update:  The patient is following up today.  Last visit, I encouraged the patient to increase her levodopa back to 2 tablets in the morning, one in the afternoon and one evening, because every time she decreased it she became more rigid.  She states that she did that and she is doing "okay."  She also has a history  of REM behavior disorder and is on half a tablet of clonazepam at nighttime.  She denies falls since last visit.  No hallucinations.  May have occasional lightheadedness but no near syncope.  Thinks that she has "muscle weakness" but points to the neck and states that it is painful and then will feel like the head will tumble off.  Asks me about "posture PT" at [facility].    10/02/16 update:  Pt f/u today.  Pt is on carbidopa/levodopa 25/100, 2 in  the AM, 1 in the afternoon and 1 in the evening.  Still on klonopin 0.5 mg, 1/2 tablet q hs.  No falls since our last visit.  No hallucinations.  No increased lightheadedness.  No syncope.  Mood has been fair.  Doing at least 1.25 miles per day on her [place] for exercise.  02/05/17 update:  Patient follows up today.  She is on carbidopa/levodopa 25/100, 2 tablets in the morning, one in the afternoon and one in the evening.  Reports that she has been doing well since our last visit.  Minimal tremor.  No falls.  No lightheadedness or near syncope.  She takes clonazepam, 0.5 mg, half tablet at night and does well with this.  She sleeps well.  No acting out of the dreams.  When asked about exercise, she states that "I was doing well until winter hit."  In regards to headache, she was having more than usual and was taking 4 tylenol per day but she stopped doing that.     PREVIOUS MEDICATIONS: none to date  ALLERGIES:   Allergies  Allergen Reactions  . Sulfonamide Derivatives Anaphylaxis  . Topamax [Topiramate] Other (See Comments)    Metabolic acidosis   . Biaxin [Clarithromycin] Other (See Comments)    pericarditis  . Hydrocodone Other (See Comments)    "hyper and climbing the walls"  . Motrin [Ibuprofen] Other (See Comments)    headaches  . Symbicort [Budesonide-Formoterol Fumarate]     02/07/15 tremor  . Percocet [Oxycodone-Acetaminophen] Itching and Rash  . Tape Itching and Rash    Use paper tape only  . Tetracyclines & Related Other (See Comments)      "immediate yeast infection"    CURRENT MEDICATIONS:  Outpatient Encounter Prescriptions as of 02/05/2017  Medication Sig  . acetaminophen (TYLENOL) 325 MG tablet Take 1,000 mg by mouth 2 (two) times daily as needed.   [name] aspirin 81 MG tablet Take 81 mg by mouth daily.    . carbidopa-levodopa ([name]) 25-100 MG tablet TAKE 2 TABLETS IN THE MORNING, 1 TABLET IN THE AFTERNOON, 1 IN THE EVENING  . clonazePAM (KLONOPIN) 0.5 MG tablet TAKE 0.5-1 TABLET BY MOUTH AT BEDTIME  . gabapentin ([name]) 300 MG capsule 2 tablets in the morning, 1 in the afternoon and 2 in the evening (Patient taking differently: Take 600 mg by mouth 3 (three) times daily. )  . losartan (COZAAR) 50 MG tablet TAKE 1 TABLET EVERY DAY  . metoprolol tartrate (LOPRESSOR) 25 MG tablet Take 1 tablet (25 mg total) by mouth 2 (two) times daily.  . Vitamin D, Ergocalciferol, ([name]) 50000 units CAPS capsule TAKE 1 CAPSULE BY MOUTH EVERY WEEK   No facility-administered encounter medications on file as of 02/05/2017.     PAST MEDICAL HISTORY:   Past Medical History:  Diagnosis Date  . 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 Aortic root aneurysm ([place])   . Breast cancer ([place]) 2002   infiltrative ductal carcinoma   . COPD (chronic obstructive pulmonary disease) ([place]) 9/08  . Ductal carcinoma of breast, estrogen receptor positive, stage 1 ([place]) 09/16/2012  . Endometriosis   . Hypercalcemia 09/14/2013  . Lesion of breast 1992   right, benign  . Lung disease    secondary to [name] infection  . Mastalgia 7/95  . Osteoporosis, post-menopausal 03/14/2012  . Ovarian cancer ([place]) 2004  . Pancreatitis    secondary to Cholelithiasis  . Parkinson disease ([place])  02/10/15  . Post-thoracotomy pain syndrome   . Pseudomonas pneumonia ([place]) 5/09  . Uterine fibroid   . Vitamin D deficiency     PAST SURGICAL HISTORY:   Past Surgical History:  Procedure Laterality Date  . ABDOMINAL HYSTERECTOMY     & BSO  for Mucinous borderline tumor of R ovary 2004  . APPENDECTOMY  2004  . BREAST BIOPSY  08/2004   right breast-benign  . BREAST LUMPECTOMY  1992   benign  . CARDIAC CATHETERIZATION  2007   essentially negation for significant CAD  . CHOLECYSTECTOMY  2001  . COLONOSCOPY  2014   Dr [name] , due 2019  . LUNG BIOPSY  2002   [name], Dr [name]  . MASTECTOMY MODIFIED RADICAL  2002   oral chemotheraphy (tamoxifen then Armidex) no radiation, Dr.[name]  . TONSILLECTOMY  1946    SOCIAL HISTORY:   Social History   Social History  . Marital status: Widowed    Spouse name: N/A  . Number of children: 0  . Years of education: N/A   Occupational History  . 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 Smoking status: Never Smoker  . Smokeless tobacco: Never Used  . Alcohol use No  . Drug use: No  . Sexual activity: No     Comment: TAH/BSO   Other Topics Concern  . 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 Father Deceased   lung cancer  . Sister Alive   colon/breast cancer  . Mother Deceased   emphysema  . Brother Alive   healthy  . Paternal Aunt   . Paternal Aunt   . Paternal Aunt   . Neg Hx     ROS:  A complete 10 system review of systems was obtained and was unremarkable apart from what is mentioned above.  PHYSICAL EXAMINATION:    VITALS:   Vitals:   02/05/17 1055  BP: 92/66  Pulse: 70  SpO2: 98%  Weight: 148 lb (67.1 kg)  Height: 5' 2.5" (1.588 m)    GEN:  The patient appears stated age and is in NAD. HEENT:  Normocephalic, atraumatic.  The mucous membranes are moist. The superficial temporal arteries are without ropiness or tenderness. CV:  RRR Lungs:  CTAB Neck/HEME:  There are no carotid bruits bilaterally.  Neurological examination:  Orientation:  Montreal Cognitive Assessment   10/02/2016  Visuospatial/ Executive (0/5) 4  Naming (0/3) 3  Attention: Read list of digits (0/2) 2  Attention: Read list of letters (0/1) 1  Attention: Serial 7 subtraction starting at 100 (0/3) 3  Language: Repeat phrase (0/2) 2  Language : Fluency (0/1) 1  Abstraction (0/2) 2  Delayed Recall (0/5) 3  Orientation (0/6) 6  Total 27  Adjusted Score (based on education) 27   Cranial nerves: There is good facial symmetry with the exception of the fact that when asked to raise the eyebrows, the right eyebrow raises further than the left.  There is mild facial hypomimia. The speech is fluent and clear. Soft palate rises symmetrically and there is no tongue deviation. Hearing is intact to conversational tone. Sensation: Sensation is intact to light touch throughout. Motor: Strength is 5/5 in the bilateral upper and lower extremities.   Shoulder shrug is equal and symmetric.  There  is no pronator drift.   Movement examination: Tone: There is normal tone bilaterally  Abnormal movements: There is no tremor today (seen or felt) Coordination:  There is no decremation with any form of RAMS, including alternating supination and pronation of the forearm, hand opening and closing, finger taps, heel taps and toe taps. Gait and Station: The patient has no difficulty arising out of a deep-seated chair without the use of the hands. The patient's stride length is normal, and good arm swing  LABS  Lab Results  Component Value Date   WBC 6.2 12/27/2016   HGB 14.3 12/27/2016   HCT 42.8 12/27/2016   MCV 93.9 12/27/2016   PLT 165.0 12/27/2016     Chemistry      Component Value Date/Time   NA 139 12/27/2016 1426   NA 140 03/28/2016 1028   K 3.7 12/27/2016 1426   K 3.8 03/28/2016 1028   CL 101 12/27/2016 1426   CL 102 03/10/2013 1529   CO2 32 12/27/2016 1426   CO2 25 03/28/2016 1028   BUN 13 12/27/2016 1426   BUN 15.0 03/28/2016 1028   CREATININE 0.86 12/27/2016 1426   CREATININE 0.9 03/28/2016  1028      Component Value Date/Time   CALCIUM 9.4 12/27/2016 1426   CALCIUM 9.5 03/28/2016 1028   ALKPHOS 77 12/27/2016 1426   ALKPHOS 75 03/28/2016 1028   AST 12 12/27/2016 1426   AST 18 03/28/2016 1028   ALT 27 12/27/2016 1426   ALT <9 03/28/2016 1028   BILITOT 1.3 (H) 12/27/2016 1426   BILITOT 1.12 03/28/2016 1028       ASSESSMENT/PLAN:  1.   idiopathic Parkinson's disease.  The patient has tremor, bradykinesia, rigidity and mild postural instability.  Sx's have been present for at least since March, 2015 by the patients account today.  She is Hoehn and Yahr stage 2-2.5.    -She looks better on carbidopa/levodopa 25/100,  2/1/1.  Will continue that  -encouraged her to look into the rock [place] boxing that should be coming to [place] soon.  She lives there. 2.  Mild dysphagia  -The patient states that this is no longer a problem 3.  Long hx migraine  -No longer a candidate for triptan therapy.  This is due to cerebral small vessel disease.  She does have Cambia for when necessary use.  Encouraged her to try it as she has been having more headaches.  Was nervous to try due to bad taste and told her that it is not nearly as bad as the headache feels. 4.  Neck pain/hyperreflexia  -MRI cervical spine in April, 2016 just demonstrated mild multilevel cervical spondylosis. 5.  REM behavior disorder  -Doing well with low-dose clonazepam, 0.5 mg, half tablet at night.  Discussed risks, benefits, side effects.  Understanding is expressed.  This was refilled today.   6.  Mild cognitive impairment  -Talked to her again about the fact that I saw no evidence of dementia today, although it can develop in the future.  She and I talked about the differences between mild cognitive impairment and Parkinson's related dementia.  Asked me about this last few visits.  [facility] is very good.  Think depression could play role here.  -talked about importance of mental and physical activities and importance of  remaining social   7.  Follow-up in the next few months, sooner should new neurologic issues arise.  Much greater than 50% of this visit was spent in counseling  and coordinating care.  Total face to face time:  20 min

## 2017-02-05 ENCOUNTER — Ambulatory Visit (INDEPENDENT_AMBULATORY_CARE_PROVIDER_SITE_OTHER): Payer: Medicare Other | Admitting: Neurology

## 2017-02-05 ENCOUNTER — Encounter: Payer: Self-pay | Admitting: Neurology

## 2017-02-05 VITALS — BP 92/66 | HR 70 | Ht 62.5 in | Wt 148.0 lb

## 2017-02-05 DIAGNOSIS — G20 Parkinson's disease: Secondary | ICD-10-CM | POA: Diagnosis not present

## 2017-02-05 DIAGNOSIS — G4752 REM sleep behavior disorder: Secondary | ICD-10-CM | POA: Diagnosis not present

## 2017-02-05 MED ORDER — CLONAZEPAM 0.5 MG PO TABS: ORAL_TABLET | ORAL | 1 refills | Status: DC

## 2017-02-05 NOTE — Addendum Note (Signed)
Addended byAnnamaria Helling on: 02/05/2017 11:38 AM   Modules accepted: Orders

## 2017-03-13 ENCOUNTER — Ambulatory Visit (INDEPENDENT_AMBULATORY_CARE_PROVIDER_SITE_OTHER)
Admission: RE | Admit: 2017-03-13 | Discharge: 2017-03-13 | Disposition: A | Discharge status: Home / Self Care | Payer: Medicare Other | Source: Ambulatory Visit | Attending: Internal Medicine | Admitting: Internal Medicine

## 2017-03-13 ENCOUNTER — Ambulatory Visit (INDEPENDENT_AMBULATORY_CARE_PROVIDER_SITE_OTHER): Payer: Medicare Other | Admitting: Internal Medicine

## 2017-03-13 ENCOUNTER — Telehealth: Payer: Self-pay

## 2017-03-13 ENCOUNTER — Encounter: Payer: Self-pay | Admitting: Internal Medicine

## 2017-03-13 VITALS — BP 114/72 | HR 72 | Temp 98.1°F | Resp 24 | Wt 145.5 lb

## 2017-03-13 DIAGNOSIS — R911 Solitary pulmonary nodule: Secondary | ICD-10-CM

## 2017-03-13 DIAGNOSIS — J449 Chronic obstructive pulmonary disease, unspecified: Secondary | ICD-10-CM

## 2017-03-13 MED ORDER — PREDNISONE 20 MG PO TABS: 40.0000 mg | ORAL_TABLET | Freq: Every day | ORAL | 0 refills | Status: DC

## 2017-03-13 MED ORDER — AZITHROMYCIN 250 MG PO TABS: ORAL_TABLET | ORAL | 0 refills | Status: DC

## 2017-03-13 NOTE — Patient Instructions (Signed)
Please let us know if you are not improving over the next day or so.

## 2017-03-13 NOTE — Progress Notes (Signed)
Subjective:    Patient ID: Crystal Bass, female    DOB: August 04, 1941, 76 y.o.   MRN: [id number]  HPI Here due to respiratory symptoms  Has cough -some green sputum Sick for almost a week Some SOB--but not much more than usual for her Has been taking it easy Known bronchiectasis and [name] in past Not really having head congestion  Felt feverish 1 day No sweats or chills Sore throat at first with clear nasal discharge--this is better No ear pain  Tried benedryl and tylenol (for achiness)  Current Outpatient Prescriptions on File Prior to Visit  Medication Sig Dispense Refill  . acetaminophen (TYLENOL) 325 MG tablet Take 1,000 mg by mouth 2 (two) times daily as needed.     [name] aspirin 81 MG tablet Take 81 mg by mouth daily.      . carbidopa-levodopa ([name]) 25-100 MG tablet TAKE 2 TABLETS IN THE MORNING, 1 TABLET IN THE AFTERNOON, 1 IN THE EVENING 360 tablet 1  . clonazePAM (KLONOPIN) 0.5 MG tablet TAKE 0.5-1 TABLET BY MOUTH AT BEDTIME 90 tablet 1  . gabapentin ([name]) 300 MG capsule 2 tablets in the morning, 1 in the afternoon and 2 in the evening (Patient taking differently: Take 600 mg by mouth 3 (three) times daily. ) 450 capsule 3  . losartan (COZAAR) 50 MG tablet TAKE 1 TABLET EVERY DAY 90 tablet 3  . metoprolol tartrate (LOPRESSOR) 25 MG tablet Take 1 tablet (25 mg total) by mouth 2 (two) times daily. 180 tablet 2  . Vitamin D, Ergocalciferol, ([name]) 50000 units CAPS capsule TAKE 1 CAPSULE BY MOUTH EVERY WEEK 12 capsule 4   No current facility-administered medications on file prior to visit.     Allergies  Allergen Reactions  . Sulfonamide Derivatives Anaphylaxis  . Topamax [Topiramate] Other (See Comments)    Metabolic acidosis   . Biaxin [Clarithromycin] Other (See Comments)    pericarditis  . Hydrocodone Other (See Comments)    "hyper and climbing the walls"  . Motrin [Ibuprofen] Other (See Comments)    headaches  . Symbicort [Budesonide-Formoterol  Fumarate]     02/07/15 tremor  . Percocet [Oxycodone-Acetaminophen] Itching and Rash  . Tape Itching and Rash    Use paper tape only  . Tetracyclines & Related Other (See Comments)    "immediate yeast infection"    Past Medical History:  Diagnosis Date  . 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 Aortic root aneurysm ([place])   . Breast cancer ([place]) 2002   infiltrative ductal carcinoma   . COPD (chronic obstructive pulmonary disease) ([place]) 9/08  . Ductal carcinoma of breast, estrogen receptor positive, stage 1 ([place]) 09/16/2012  . Endometriosis   . Hypercalcemia 09/14/2013  . Lesion of breast 1992   right, benign  . Lung disease    secondary to [name] infection  . Mastalgia 7/95  . Osteoporosis, post-menopausal 03/14/2012  . Ovarian cancer ([place]) 2004  . Pancreatitis    secondary to Cholelithiasis  . Parkinson disease ([place]) 02/10/15  . Post-thoracotomy pain syndrome   . Pseudomonas pneumonia ([place]) 5/09  . Uterine fibroid   . Vitamin D deficiency     Past Surgical History:  Procedure Laterality Date  . 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 APPENDECTOMY  2004  . BREAST BIOPSY  08/2004   right breast-benign  . BREAST LUMPECTOMY  1992  benign  . CARDIAC CATHETERIZATION  2007   essentially negation for significant CAD  . CHOLECYSTECTOMY  2001  . COLONOSCOPY  2014   Dr [name] , due 2019  . LUNG BIOPSY  2002   [name], Dr [name]  . MASTECTOMY MODIFIED RADICAL  2002   oral chemotheraphy (tamoxifen then Armidex) no radiation, Dr.[name]  . TONSILLECTOMY  1946    Family History  Problem Relation Age of Onset  . Lung cancer Father   . Breast cancer Sister 14  . Colon cancer Sister   . Emphysema Mother     was never a smoker  . Stroke Paternal Aunt     in mid 39s  . Osteoporosis Paternal Aunt   . Myasthenia gravis Paternal Aunt   . Diabetes Neg Hx   . Heart disease Neg Hx     Social History    Social History  . Marital status: Widowed    Spouse name: N/A  . Number of children: 0  . Years of education: N/A   Occupational History  . 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 Smoking status: Never Smoker  . Smokeless tobacco: Never Used  . Alcohol use No  . Drug use: No  . Sexual activity: No     Comment: TAH/BSO   Other Topics Concern  . 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  Mild difficulty breathing  HENT:  Mouth/Throat: Oropharynx is clear and moist. No oropharyngeal exudate.  No sinus tenderness TMs normal Mild nasal congestion   Neck: No thyromegaly present.  Pulmonary/Chest: Effort normal. She has no rales.  Fair air movement but mildly prolonged expiratory phase and mild exp wheezing  Lymphadenopathy:    She has no cervical adenopathy.          Assessment & Plan:

## 2017-03-13 NOTE — Assessment & Plan Note (Addendum)
Seems to have exacerbation Mild obstructive symptoms CXR is abnormal--but doesn't look changed Will give z-pak and prednisone burst

## 2017-03-13 NOTE — Telephone Encounter (Signed)
Dr Silvio Pate called and read impression from CXR; Dr Silvio Pate said OK to wait until 03/14/17.

## 2017-03-13 NOTE — Progress Notes (Signed)
Pre visit review using our clinic review tool, if applicable. No additional management support is needed unless otherwise documented below in the visit note. 

## 2017-03-13 NOTE — Telephone Encounter (Addendum)
Diane with [facility] radiology called CXR report; report is in Epic; Dr Silvio Pate out of office but Larene Beach will text Dr Silvio Pate to call office. Glenda Chroman FNP looked at printed report and said OK to wait for Dr Silvio Pate to review.

## 2017-03-14 ENCOUNTER — Other Ambulatory Visit: Payer: Self-pay | Admitting: Internal Medicine

## 2017-03-14 DIAGNOSIS — R911 Solitary pulmonary nodule: Secondary | ICD-10-CM

## 2017-03-14 NOTE — Telephone Encounter (Signed)
Message left for her---unidentified line Please try her later  CXR doesn't show pneumonia There is a nodule that apparently was there before--but is more conspicuous now. The radiologist is recommending a CT scan to follow up on this. Looking back, this has been there for years and so I am unsure if we really need to repeat a CT scan (last one done a year ago) Please check with her to see if she is comfortable holding off on another CT scan, or if she would like to proceed

## 2017-03-14 NOTE — Telephone Encounter (Signed)
Discussed with her. She states the nodule has been present since 2002, but Dr Beryle Beams followed it regularly. If there is any question, she is more comfortable with going ahead with the CT scan Will order for her at [place]

## 2017-03-21 ENCOUNTER — Ambulatory Visit
Admission: RE | Admit: 2017-03-21 | Discharge: 2017-03-21 | Disposition: A | Discharge status: Home / Self Care | Payer: Medicare Other | Source: Ambulatory Visit | Attending: Internal Medicine | Admitting: Internal Medicine

## 2017-03-21 DIAGNOSIS — R911 Solitary pulmonary nodule: Secondary | ICD-10-CM

## 2017-03-27 ENCOUNTER — Other Ambulatory Visit: Payer: Self-pay | Admitting: Emergency Medicine

## 2017-03-27 DIAGNOSIS — C50412 Malignant neoplasm of upper-outer quadrant of left female breast: Secondary | ICD-10-CM

## 2017-03-28 ENCOUNTER — Other Ambulatory Visit (HOSPITAL_BASED_OUTPATIENT_CLINIC_OR_DEPARTMENT_OTHER): Payer: Medicare Other

## 2017-03-28 ENCOUNTER — Encounter: Payer: Self-pay | Admitting: Adult Health

## 2017-03-28 ENCOUNTER — Ambulatory Visit (HOSPITAL_BASED_OUTPATIENT_CLINIC_OR_DEPARTMENT_OTHER): Payer: Medicare Other | Admitting: Adult Health

## 2017-03-28 ENCOUNTER — Encounter: Payer: Medicare Other | Admitting: Nurse Practitioner

## 2017-03-28 VITALS — BP 125/77 | HR 64 | Temp 97.8°F | Resp 18 | Ht 62.5 in | Wt 145.6 lb

## 2017-03-28 DIAGNOSIS — C50412 Malignant neoplasm of upper-outer quadrant of left female breast: Secondary | ICD-10-CM

## 2017-03-28 DIAGNOSIS — Z170 Estrogen receptor positive status [ER+]: Secondary | ICD-10-CM

## 2017-03-28 DIAGNOSIS — Z853 Personal history of malignant neoplasm of breast: Secondary | ICD-10-CM | POA: Diagnosis not present

## 2017-03-28 LAB — CBC WITH DIFFERENTIAL/PLATELET
BASO%: 0.9 % (ref 0.0–2.0)
BASOS ABS: 0.1 10*3/uL (ref 0.0–0.1)
EOS ABS: 0.2 10*3/uL (ref 0.0–0.5)
EOS%: 3.1 % (ref 0.0–7.0)
HEMATOCRIT: 41.4 % (ref 34.8–46.6)
HEMOGLOBIN: 13.7 g/dL (ref 11.6–15.9)
LYMPH#: 1.3 10*3/uL (ref 0.9–3.3)
LYMPH%: 18.9 % (ref 14.0–49.7)
MCH: 31.4 pg (ref 25.1–34.0)
MCHC: 33 g/dL (ref 31.5–36.0)
MCV: 95.1 fL (ref 79.5–101.0)
MONO#: 0.7 10*3/uL (ref 0.1–0.9)
MONO%: 10.7 % (ref 0.0–14.0)
NEUT%: 66.4 % (ref 38.4–76.8)
NEUTROS ABS: 4.4 10*3/uL (ref 1.5–6.5)
Platelets: 210 10*3/uL (ref 145–400)
RBC: 4.35 10*6/uL (ref 3.70–5.45)
RDW: 14.2 % (ref 11.2–14.5)
WBC: 6.7 10*3/uL (ref 3.9–10.3)

## 2017-03-28 LAB — COMPREHENSIVE METABOLIC PANEL
ALBUMIN: 3.5 g/dL (ref 3.5–5.0)
ALT: <6 U/L (ref 0–55)
AST: 12 U/L (ref 5–34)
Alkaline Phosphatase: 85 U/L (ref 40–150)
Anion Gap: 8 mEq/L (ref 3–11)
BUN: 17.3 mg/dL (ref 7.0–26.0)
CALCIUM: 9.6 mg/dL (ref 8.4–10.4)
CHLORIDE: 104 meq/L (ref 98–109)
CO2: 28 mEq/L (ref 22–29)
Creatinine: 0.8 mg/dL (ref 0.6–1.1)
EGFR: 72 mL/min/{1.73_m2} — AB (ref >90)
Glucose: 75 mg/dl (ref 70–140)
Potassium: 4.2 mEq/L (ref 3.5–5.1)
SODIUM: 141 meq/L (ref 136–145)
TOTAL PROTEIN: 6.9 g/dL (ref 6.4–8.3)
Total Bilirubin: 1.38 mg/dL — ABNORMAL HIGH (ref 0.20–1.20)

## 2017-03-28 NOTE — Progress Notes (Signed)
CLINIC:  Survivorship   REASON FOR VISIT:  Routine follow-up for history of breast cancer.   BRIEF ONCOLOGIC HISTORY:    Breast cancer of upper-outer quadrant of left female breast ([place])   12/09/2000 Surgery    Left breast modified radical mastectomy stage I ER positive      01/07/2001 - 01/15/2011 Anti-estrogen oral therapy    Tamoxifen later transitioned to aromatase inhibitor       PET scan    Lung nodule: Mycobacterium avium tuberculosis treated 2-3 occasions by Dr. Chase Caller      04/15/2003 Surgery    Hysterectomy plus oophorectomy for low-grade ovarian cancer        INTERVAL HISTORY:  Crystal Bass presents to the [facility] today for routine follow-up for her history of breast cancer.  Overall, she reports feeling quite well. She does have some chronic pain from surgery that makes her feel like she has to get her clothes off by 2pm.  She takes Gabapentin which helps, but hasn't totally alleviated the pain.  She has no new pain or breast concerns.  She sees her PCP annually, and Dr. Sabra Heck annually.      REVIEW OF SYSTEMS:   Review of Systems  Constitutional: Negative for appetite change, chills, diaphoresis, fatigue and unexpected weight change.  HENT:   Negative for hearing loss and lump/mass.   Respiratory: Positive for cough (chronic) and shortness of breath (chronic). Negative for chest tightness.   Cardiovascular: Negative for chest pain, leg swelling and palpitations.  Gastrointestinal: Negative for abdominal distention, abdominal pain, constipation, diarrhea, nausea, rectal pain and vomiting.  Endocrine: Negative for hot flashes.  Genitourinary: Negative for difficulty urinating, dyspareunia and vaginal discharge.   Musculoskeletal: Negative for arthralgias and gait problem.  Skin: Negative for itching and rash.  Neurological: Negative for dizziness, gait problem, headaches and light-headedness.  Hematological: Negative for adenopathy. Does not  bruise/bleed easily.  Psychiatric/Behavioral: Negative for depression. The patient is not nervous/anxious.   Breast: Denies any new nodularity, masses, tenderness, nipple changes, or nipple discharge.     PAST MEDICAL/SURGICAL HISTORY:  Past Medical History:  Diagnosis Date  . 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 Aortic root aneurysm ([place])   . Breast cancer ([place]) 2002   infiltrative ductal carcinoma   . COPD (chronic obstructive pulmonary disease) ([place]) 9/08  . Ductal carcinoma of breast, estrogen receptor positive, stage 1 ([place]) 09/16/2012  . Endometriosis   . Hypercalcemia 09/14/2013  . Lesion of breast 1992   right, benign  . Lung disease    secondary to [name] infection  . Mastalgia 7/95  . Osteoporosis, post-menopausal 03/14/2012  . Ovarian cancer ([place]) 2004  . Pancreatitis    secondary to Cholelithiasis  . Parkinson disease ([place]) 02/10/15  . Post-thoracotomy pain syndrome   . Pseudomonas pneumonia ([place]) 5/09  . Uterine fibroid   . Vitamin D deficiency    Past Surgical History:  Procedure Laterality Date  . 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 APPENDECTOMY  2004  . BREAST BIOPSY  08/2004   right breast-benign  . BREAST LUMPECTOMY  1992   benign  . CARDIAC CATHETERIZATION  2007   essentially negation for significant CAD  . CHOLECYSTECTOMY  2001  . COLONOSCOPY  2014   Dr [name] , due 2019  . LUNG BIOPSY  2002   [name], Dr [name]  . MASTECTOMY  MODIFIED RADICAL  2002   oral chemotheraphy (tamoxifen then Armidex) no radiation, Dr.[name]  . TONSILLECTOMY  1946     ALLERGIES:  Allergies  Allergen Reactions  . Sulfonamide Derivatives Anaphylaxis  . Topamax [Topiramate] Other (See Comments)    Metabolic acidosis   . Biaxin [Clarithromycin] Other (See Comments)    pericarditis  . Hydrocodone Other (See Comments)    "hyper and climbing the walls"  . Motrin [Ibuprofen] Other (See  Comments)    headaches  . Symbicort [Budesonide-Formoterol Fumarate]     02/07/15 tremor  . Percocet [Oxycodone-Acetaminophen] Itching and Rash  . Tape Itching and Rash    Use paper tape only  . Tetracyclines & Related Other (See Comments)    "immediate yeast infection"     CURRENT MEDICATIONS:  Outpatient Encounter Prescriptions as of 03/28/2017  Medication Sig  . acetaminophen (TYLENOL) 325 MG tablet Take 1,000 mg by mouth 2 (two) times daily as needed.   [name] aspirin 81 MG tablet Take 81 mg by mouth daily.    . carbidopa-levodopa ([name]) 25-100 MG tablet TAKE 2 TABLETS IN THE MORNING, 1 TABLET IN THE AFTERNOON, 1 IN THE EVENING  . clonazePAM (KLONOPIN) 0.5 MG tablet TAKE 0.5-1 TABLET BY MOUTH AT BEDTIME  . gabapentin ([name]) 300 MG capsule 2 tablets in the morning, 1 in the afternoon and 2 in the evening (Patient taking differently: Take 600 mg by mouth 3 (three) times daily. )  . losartan (COZAAR) 50 MG tablet TAKE 1 TABLET EVERY DAY  . metoprolol tartrate (LOPRESSOR) 25 MG tablet Take 1 tablet (25 mg total) by mouth 2 (two) times daily.  . predniSONE (DELTASONE) 20 MG tablet Take 2 tablets (40 mg total) by mouth daily. For 5 days, then 1 tab daily for 5 days  . Vitamin D, Ergocalciferol, ([name]) 50000 units CAPS capsule TAKE 1 CAPSULE BY MOUTH EVERY WEEK  . [DISCONTINUED] azithromycin (ZITHROMAX Z-PAK) 250 MG tablet Take 2 tablets (500 mg) on  Day 1,  followed by 1 tablet (250 mg) once daily on Days 2 through 5.   No facility-administered encounter medications on file as of 03/28/2017.      ONCOLOGIC FAMILY HISTORY:  Family History  Problem Relation Age of Onset  . Lung cancer Father   . Breast cancer Sister 38  . Colon cancer Sister   . Emphysema Mother     was never a smoker  . Stroke Paternal Aunt     in mid 45s  . Osteoporosis Paternal Aunt   . Myasthenia gravis Paternal Aunt   . Diabetes Neg Hx   . Heart disease Neg Hx     GENETIC COUNSELING/TESTING: Not  done  SOCIAL HISTORY:  Crystal Bass is widowed and lives alone in a retirement community in [place], [place].  She has no children.  Crystal Bass is currently retired.  She denies any current or history of tobacco, alcohol, or illicit drug use.     PHYSICAL EXAMINATION:  Vital Signs: Vitals:   03/28/17 1154  BP: 125/77  Pulse: 64  Resp: 18  Temp: 97.8 F (36.6 C)   Filed Weights   03/28/17 1154  Weight: 145 lb 9.6 oz (66 kg)   General: Well-nourished, well-appearing female in no acute distress.  Unaccompanied today.   HEENT: Head is normocephalic.  Pupils equal and reactive to light. Conjunctivae clear without exudate.  Sclerae anicteric. Oral mucosa is pink, moist.  Oropharynx is pink without lesions or erythema.  Lymph: No cervical,  supraclavicular, or infraclavicular lymphadenopathy noted on palpation.  Cardiovascular: Regular rate and rhythm.[name] Respiratory: Clear to auscultation bilaterally. Chest expansion symmetric; breathing non-labored.  Breast Exam:  -Left breast: No appreciable masses on palpation. No skin redness, thickening, or peau d'orange appearance; no nipple retraction or nipple discharge; mild distortion in symmetry at previous lumpectomy site well healed scar without erythema or nodularity.  -Right breast: No appreciable masses on palpation. No skin redness, thickening, or peau d'orange appearance; no nipple retraction or nipple discharge; -Axilla: No axillary adenopathy bilaterally.  GI: Abdomen soft and round; non-tender, non-distended. Bowel sounds normoactive. No hepatosplenomegaly.   GU: Deferred.  Neuro: No focal deficits. Steady gait.  Psych: Mood and affect normal and appropriate for situation.  Extremities: No edema. Skin: Warm and dry.  LABORATORY DATA:  None for this visit   DIAGNOSTIC IMAGING:  Most recent mammogram:      ASSESSMENT AND PLAN:  Ms.. Bass is a pleasant 76 y.o. female with history of Stage I left breast  cancer, ER+/PR-/HER2-, diagnosed in 2002, treated with left mastectomy, adjuvant radiation therapy, and anti-estrogen therapy with 10 years of Tamoxifen later transitioned to aromatase inhibitor.  [facility] for surveillance and routine follow-up.   1. History of breast cancer:  Crystal Bass is currently clinically and radiographically without evidence of disease or recurrence of breast cancer. She will be due for mammogram in 05/2017.    I encouraged her to call me with any questions or concerns before her next visit at the cancer center, and I would be happy to see her sooner, if needed.    2. Bone health:  Given Crystal Bass age, history of breast cancer, and her previous anti-estrogen therapy with an aromatase inhibitor she is at risk for bone demineralization. Her last DEXA scan was in 01/2016 and was osteopenic.  This is managed by Dr. Deborra Medina, her PCP. She was given education on specific food and activities to promote bone health.  3. Cancer screening:  Due to Crystal Bass history and her age, she should receive screening for skin cancers, colon cancer. She was encouraged to follow-up with her PCP for appropriate cancer screenings.   4. Health maintenance and wellness promotion: Crystal Bass was encouraged to consume 5-7 servings of fruits and vegetables per day. She was also encouraged to engage in moderate to vigorous exercise for 30 minutes per day most days of the week. She was instructed to limit her alcohol consumption and continue to abstain from tobacco use.    Dispo:  -Return to cancer center in one year for LTS follow up   A total of (30) minutes of face-to-face time was spent with this patient with greater than 50% of that time in counseling and care-coordination.   Gardenia Phlegm, NP Survivorship Program [facility] [phone]   Note: PRIMARY CARE PROVIDER [name], [place] [phone]

## 2017-04-17 ENCOUNTER — Ambulatory Visit (HOSPITAL_BASED_OUTPATIENT_CLINIC_OR_DEPARTMENT_OTHER): Payer: Medicare Other | Admitting: Genetics

## 2017-04-17 ENCOUNTER — Other Ambulatory Visit: Payer: Medicare Other

## 2017-04-17 ENCOUNTER — Encounter: Payer: Self-pay | Admitting: Genetics

## 2017-04-17 DIAGNOSIS — Z800 Family history of malignant neoplasm of digestive organs: Secondary | ICD-10-CM | POA: Diagnosis not present

## 2017-04-17 DIAGNOSIS — Z803 Family history of malignant neoplasm of breast: Secondary | ICD-10-CM

## 2017-04-17 DIAGNOSIS — Z853 Personal history of malignant neoplasm of breast: Secondary | ICD-10-CM | POA: Diagnosis not present

## 2017-04-17 DIAGNOSIS — Z7183 Encounter for nonprocreative genetic counseling: Secondary | ICD-10-CM

## 2017-04-17 NOTE — Progress Notes (Signed)
REFERRING PROVIDER: Gardenia Phlegm, NP [place], [place] [address]  PRIMARY PROVIDER:  Lucille Passy, MD  PRIMARY REASON FOR VISIT:  1. Personal history of breast cancer   2. Family history of breast cancer   3. Family history of colon cancer      HISTORY OF PRESENT ILLNESS:   Crystal Bass, a 76 y.o. female, was seen for a [facility] cancer genetics consultation at the request of Wilber Bihari, NP due to a personal and family history of cancer.  Crystal Bass presents to clinic today to discuss the possibility of a hereditary predisposition to cancer, genetic testing, and to further clarify her future cancer risks, as well as potential cancer risks for family members.   In 2002, at the age of 50, Crystal Bass was diagnosed with ER+ PR/HER2- invasive ductal carcinoma of the left breast. This was treated with left mastectomy and anti-estrogen therapy x10 years (at least). In 2004, Crystal Bass was diagnosed with a mucinous borderline ovarian tumor. Her treatment included TAH/BSO.  CANCER HISTORY:    Breast cancer of upper-outer quadrant of left female breast ([place])   12/09/2000 Surgery    Left breast modified radical mastectomy stage I ER positive      01/07/2001 - 01/15/2011 Anti-estrogen oral therapy    Tamoxifen later transitioned to aromatase inhibitor       PET scan    Lung nodule: Mycobacterium avium tuberculosis treated 2-3 occasions by Dr. Chase Caller      04/15/2003 Surgery    Hysterectomy plus oophorectomy for low-grade ovarian cancer       Past Medical History:  Diagnosis Date  . 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 Aortic root aneurysm ([place])   . Breast cancer ([place]) 2002   infiltrative ductal carcinoma   . COPD (chronic obstructive pulmonary disease) ([place]) 9/08  . Ductal carcinoma of breast, estrogen receptor positive, stage 1 ([place]) 09/16/2012  . Endometriosis   . Hypercalcemia 09/14/2013  . Lesion of  breast 1992   right, benign  . Lung disease    secondary to [name] infection  . Mastalgia 7/95  . Osteoporosis, post-menopausal 03/14/2012  . Ovarian cancer ([place]) 2004  . Pancreatitis    secondary to Cholelithiasis  . Parkinson disease ([place]) 02/10/15  . Post-thoracotomy pain syndrome   . Pseudomonas pneumonia ([place]) 5/09  . Uterine fibroid   . Vitamin D deficiency     Past Surgical History:  Procedure Laterality Date  . 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 APPENDECTOMY  2004  . BREAST BIOPSY  08/2004   right breast-benign  . BREAST LUMPECTOMY  1992   benign  . CARDIAC CATHETERIZATION  2007   essentially negation for significant CAD  . CHOLECYSTECTOMY  2001  . COLONOSCOPY  2014   Dr [name] , due 2019  . LUNG BIOPSY  2002   [name], Dr [name]  . MASTECTOMY MODIFIED RADICAL  2002   oral chemotheraphy (tamoxifen then Armidex) no radiation, Dr.[name]  . TONSILLECTOMY  1946    Social History   Social History  . Marital status: Widowed    Spouse name: N/A  . Number of children: 0  . Years of education: N/A   Occupational History  . 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 Smoking status: Never Smoker  . Smokeless  tobacco: Never Used  . Alcohol use No  . Drug use: No  . Sexual activity: No     Comment: TAH/BSO   Other Topics Concern  . 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  Significant diagnoses are listed below: Family History  Problem Relation Age of Onset  . Lung cancer Father 74       d.82 history of smoking  . Breast cancer Sister 73  . Colon cancer Sister   . Emphysema Mother        d.64 was never a smoker  . Stroke Paternal Aunt        in mid 71s  . Osteoporosis Paternal Aunt   . Melanoma Maternal Aunt   . Cancer  Maternal Uncle        unspecified type  . Cancer Maternal Grandmother        d.82s unspecified GI cancer  . Cancer Maternal Grandfather        d.62s unspecified type  . Liver cancer Maternal Aunt   . Cancer Maternal Uncle        unspecified type  . Cancer Maternal Uncle        unspecified type  . Myasthenia gravis Paternal Aunt   . Breast cancer Cousin        d.60s-daughter of unaffected paternal aunt Eustace Moore  . Colon cancer Cousin 67       d.70-daughter of unaffected maternal aunt Leila  . Lung cancer Cousin 70       d.70-sisters to each other, both daughters of maternal uncle Johnny  . Diabetes Neg Hx   . Heart disease Neg Hx    Crystal Bass has no children. Her brother is 68 without cancers. He has two daughters, ages 87 and 21, without cancers. Crystal Bass sister is currently 21. She was diagnosed with breast cancer at 63 and had colon cancer some time after that. This sister has a daughter, age 1, and son, age 11, without cancers.  Crystal Bass mother died at age 34 without cancers. Crystal Bass had 4 maternal aunts and 5 maternal uncles. Her aunt Ima had liver cancer. Aunt Margaret had melanoma. Her aunt Tamela Oddi and Suszanne Conners did not have cancers. However, Leila's daughter had colon cancer in her 75s. Crystal Bass uncle Josph Macho did not have cancers, but his daughter, Chong Sicilian, died of lung cancer. Crystal Bass's uncle Delfino Lovett died of lung cancer. The 3 remaining uncles Charlotte Crumb, Mallie Mussel, and Thayer Jew) had unspecified forms of cancer. Johnny had 2 daughters, Jinny Sanders and Pamala Hurry, who died with lung cancer. Mallie Mussel and Thayer Jew also had children with unspecified forms of cancer. Crystal Bass maternal grandmother died in her early-80s and had a history of an unspecified gastrointestinal cancer. Crystal Bass maternal grandfather likely died in his early-60s with an unspecified form of cancer.  Crystal Bass father died at 92, shortly after being diagnosed with lung cancer. Her father did have a smoking  history. Crystal Bass had 3 paternal aunts who all lived to their early-80s without cancers. Her aunt Bert's daughter, Vickii Chafe, died in her 26s and had a history of breast cancer. Her aunt Lorraine's grandson died in his 7s with a skin cancer.  Crystal Bass is unaware of previous family history of genetic testing for hereditary cancer risks. Patient's maternal and paternal ancestors are of Caucasian descent. There is  no reported Ashkenazi Jewish ancestry. There is no known consanguinity.  GENETIC COUNSELING ASSESSMENT: Crystal Bass is a 76 y.o. female with a personal and family history which is somewhat suggestive of a hereditary cancer syndrome and predisposition to cancer. We, therefore, discussed and recommended the following at today's visit.   DISCUSSION: We reviewed the characteristics, features and inheritance patterns of hereditary cancer syndromes. We also discussed genetic testing, including the appropriate family members to test, the process of testing, insurance coverage and turn-around-time for results. We discussed the implications of a negative, positive and/or variant of uncertain significant result. We recommended Crystal Bass pursue genetic testing for the Common Hereditary Cancers Panel offered by [facility]. [facility]'s Common Hereditary Cancers Panel includes analysis of the following 46 genes: APC, ATM, AXIN2, BARD1, BMPR1A, BRCA1, BRCA2, BRIP1, CDH1, CDKN2A, CHEK2, CTNNA1, DICER1, EPCAM, GREM1, HOXB13, KIT, MEN1, MLH1, MSH2, MSH3, MSH6, MUTYH, NBN, NF1, NTHL1, PALB2, PDGFRA, PMS2, POLD1, POLE, PTEN, RAD50, RAD51C, RAD51D, SDHA, SDHB, SDHC, SDHD, SMAD4, SMARCA4, STK11, TP53, TSC1, TSC2, and VHL.   Based on Crystal Bass's personal and family history of cancer, she meets medical criteria for genetic testing. Despite that she meets criteria, she may still have an out of pocket cost. We discussed that if her out of pocket cost for testing is over $100, the laboratory will call and confirm  whether she wants to proceed with testing.  If the out of pocket cost of testing is less than $100 she will be billed by the genetic testing laboratory.   PLAN: After considering the risks, benefits, and limitations, Ms. Hairston  provided informed consent to pursue genetic testing and the blood sample was sent to [facility] for analysis of the 46-gene Common Hereditary Cancers Panel. Results should be available within approximately 3 weeks' time, at which point they will be disclosed by telephone to Ms. Ferrufino, as will any additional recommendations warranted by these results. This information will also be available in Epic.   Lastly, we encouraged Ms. Calderwood to remain in contact with cancer genetics annually so that we can continuously update the family history and inform her of any changes in cancer genetics and testing that may be of benefit for this family.   Ms.  Roebuck questions were answered to her satisfaction today. Our contact information was provided should additional questions or concerns arise. Thank you for the referral and allowing [place] to share in the care of your patient.   Mal Misty, MS, [facility] Certified Naval [email or web address] phone: [phone]  The patient was seen for a total of 40 minutes in face-to-face genetic counseling.  This patient was discussed with Drs. Magrinat, Lindi Adie and/or Burr Medico who agrees with the above.    _______________________________________________________________________ For Office Staff:  Number of people involved in session: 1 Was an Intern/ student involved with case: no

## 2017-04-18 ENCOUNTER — Other Ambulatory Visit: Payer: Self-pay | Admitting: Family Medicine

## 2017-05-01 ENCOUNTER — Telehealth: Payer: Self-pay | Admitting: Genetics

## 2017-05-01 ENCOUNTER — Ambulatory Visit: Payer: Self-pay | Admitting: Genetics

## 2017-05-01 DIAGNOSIS — Z1379 Encounter for other screening for genetic and chromosomal anomalies: Secondary | ICD-10-CM | POA: Insufficient documentation

## 2017-05-01 NOTE — Progress Notes (Signed)
HPI: Crystal Bass was previously seen in the [facility] on 04/17/2017 due to a personal and family history of cancer and concerns regarding a hereditary predisposition to cancer. Please refer to our prior cancer genetics clinic note for more information regarding Crystal Bass's medical, social and family histories, and our assessment and recommendations, at the time. Crystal Bass recent genetic test results were disclosed to her, as were recommendations warranted by these results. These results and recommendations are discussed in more detail below.  CANCER HISTORY:  In 2002, at the age of 72, Crystal Bass was diagnosed with ER+ PR/HER2- invasive ductal carcinoma of the left breast. This was treated with left mastectomy and anti-estrogen therapy x10 years (at least). In 2004, Crystal Bass was diagnosed with a mucinous borderline ovarian tumor. Her treatment included TAH/BSO.    Breast cancer of upper-outer quadrant of left female breast ([place])   12/09/2000 Surgery    Left breast modified radical mastectomy stage I ER positive      01/07/2001 - 01/15/2011 Anti-estrogen oral therapy    Tamoxifen later transitioned to aromatase inhibitor       PET scan    Lung nodule: Mycobacterium avium tuberculosis treated 2-3 occasions by Dr. Chase Caller      04/15/2003 Surgery    Hysterectomy plus oophorectomy for low-grade ovarian cancer        FAMILY HISTORY:  We obtained a detailed, 4-generation family history.  Significant diagnoses are listed below: Family History  Problem Relation Age of Onset  . Lung cancer Father 64       d.82 history of smoking  . Breast cancer Sister 45  . Colon cancer Sister   . Emphysema Mother        d.64 was never a smoker  . Stroke Paternal Aunt        in mid 106s  . Osteoporosis Paternal Aunt   . Melanoma Maternal Aunt   . Cancer Maternal Uncle        unspecified type  . Cancer Maternal Grandmother        d.82s unspecified GI cancer  .  Cancer Maternal Grandfather        d.62s unspecified type  . Liver cancer Maternal Aunt   . Cancer Maternal Uncle        unspecified type  . Cancer Maternal Uncle        unspecified type  . Myasthenia gravis Paternal Aunt   . Breast cancer Cousin        d.60s-daughter of unaffected paternal aunt Eustace Moore  . Colon cancer Cousin 69       d.70-daughter of unaffected maternal aunt Leila  . Lung cancer Cousin 70       d.70-sisters to each other, both daughters of maternal uncle Johnny  . Diabetes Neg Hx   . Heart disease Neg Hx    Crystal Bass has no children. Her brother is 81 without cancers. He has two daughters, ages 85 and 64, without cancers. Crystal Bass sister is currently 77. She was diagnosed with breast cancer at 31 and had colon cancer some time after that. This sister has a daughter, age 57, and son, age 43, without cancers.  Crystal Bass mother died at age 34 without cancers. Crystal Bass had 4 maternal aunts and 5 maternal uncles. Her aunt Ima had liver cancer. Aunt Margaret had melanoma. Her aunt Tamela Oddi and Suszanne Conners did not have cancers. However, Leila's daughter had colon cancer in her 62s. Crystal Bass uncle Josph Macho  did not have cancers, but his daughter, Chong Sicilian, died of lung cancer. Crystal Bass's uncle Delfino Lovett died of lung cancer. The 3 remaining uncles Charlotte Crumb, Mallie Mussel, and Thayer Jew) had unspecified forms of cancer. Johnny had 2 daughters, Jinny Sanders and Pamala Hurry, who died with lung cancer. Mallie Mussel and Thayer Jew also had children with unspecified forms of cancer. Crystal Bass maternal grandmother died in her early-80s and had a history of an unspecified gastrointestinal cancer. Crystal Bass maternal grandfather likely died in his early-60s with an unspecified form of cancer.  Crystal Bass father died at 52, shortly after being diagnosed with lung cancer. Her father did have a smoking history. Crystal Bass had 3 paternal aunts who all lived to their early-80s without cancers. Her aunt Bert's  daughter, Vickii Chafe, died in her 82s and had a history of breast cancer. Her aunt Lorraine's grandson died in his 15s with a skin cancer.  Crystal Bass is unaware of previous family history of genetic testing for hereditary cancer risks. Patient's maternal and paternal ancestors are of Caucasian descent. There is no reported Ashkenazi Jewish ancestry. There is no known consanguinity.  GENETIC TEST RESULTS: Genetic testing performed through [facility]'s Common Hereditary Cancers Panel reported out on 04/26/2017 showed no pathogenic mutations. [facility] Common Hereditary Cancers Panel includes analysis of the following 46 genes: APC, ATM, AXIN2, BARD1, BMPR1A, BRCA1, BRCA2, BRIP1, CDH1, CDKN2A, CHEK2, CTNNA1, DICER1, EPCAM, GREM1, HOXB13, KIT, MEN1, MLH1, MSH2, MSH3, MSH6, MUTYH, NBN, NF1, NTHL1, PALB2, PDGFRA, PMS2, POLD1, POLE, PTEN, RAD50, RAD51C, RAD51D, SDHA, SDHB, SDHC, SDHD, SMAD4, SMARCA4, STK11, TP53, TSC1, TSC2, and VHL.  A variant of uncertain significance (VUS) called MSH6 c.3450A>C (p.[id number]) was also noted. At this time, it is unknown if this variant is associated with increased cancer risk or if this is a normal finding, but most variants such as this get reclassified to being inconsequential. It should not be used to make medical management decisions. With time, we suspect the lab will determine the significance of this variant, if any. If we do learn more about it, we will try to contact Crystal Bass to discuss it further. However, it is important to stay in touch with [place] periodically and keep the address and phone number up to date.  The test report will be scanned into EPIC and will be located under the Molecular Pathology section of the Results Review tab.A portion of the result report is included below for reference.     We discussed with Crystal Bass that since the current genetic testing is not perfect, it is possible there may be a gene mutation in one of these genes that current  testing cannot detect, but that chance is small. We also discussed, that it is possible that another gene that has not yet been discovered, or that we have not yet tested, is responsible for the cancer diagnoses in the family. Therefore, important to remain in touch with cancer genetics in the future so that we can continue to offer Crystal Bass. Twyman the most up to date genetic testing.   CANCER SCREENING RECOMMENDATIONS:  This result is reassuring and indicates that it is unlikely Crystal Bass. Faulkner has an increased risk for a future cancer due to a mutation in one of these genes. However, Crystal Bass. Cody's personal and family history of cancers remain unexplained. Because no causative for actionable mutations were identified, it is recommended she continue to follow the cancer management and screening guidelines provided by her oncology and primary healthcare provider. Due to her sister's history of colon cancer, NCCN  guidelines state that Crystal Bass. Hai should undergo colonoscopy at least every 5 years unless otherwise advised by her physicians.   RECOMMENDATIONS FOR FAMILY MEMBERS: Individuals in this family might be at some increased risk of developing cancer, over the general population risk, simply due to the family history of cancer. We recommended each of Crystal Bass. Mincer's relatives discuss their family history of cancers with their physicians to establish a personalized cancer screening plan.  FOLLOW-UP: Lastly, we discussed with Crystal Bass. Paulo that cancer genetics is a rapidly advancing field and it is possible that new genetic tests will be appropriate for her and/or her family members in the future. We encouraged her to remain in contact with cancer genetics on an annual basis so we can update her personal and family histories and let her know of advances in cancer genetics that may benefit this family.   Our contact number was provided. Crystal Bass. Birkey questions were answered to her satisfaction, and she knows  she is welcome to call us at anytime with additional questions or concerns.   Mal Misty, Crystal Bass, [facility] Certified Naval [email or web address]

## 2017-05-01 NOTE — Telephone Encounter (Signed)
Reviewed that germline genetic testing revealed no pathogenic mutations. This is considered to be a negative result. Testing was performed through [facility]'s 46-gene Common Hereditary Cancers Panel. [facility] Common Hereditary Cancers Panel includes analysis of the following 46 genes: APC, ATM, AXIN2, BARD1, BMPR1A, BRCA1, BRCA2, BRIP1, CDH1, CDKN2A, CHEK2, CTNNA1, DICER1, EPCAM, GREM1, HOXB13, KIT, MEN1, MLH1, MSH2, MSH3, MSH6, MUTYH, NBN, NF1, NTHL1, PALB2, PDGFRA, PMS2, POLD1, POLE, PTEN, RAD50, RAD51C, RAD51D, SDHA, SDHB, SDHC, SDHD, SMAD4, SMARCA4, STK11, TP53, TSC1, TSC2, and VHL.  A variant of uncertain significance (VUS) was noted in MSH6. The specific MSH6 variant is c.3450A>C (p.[id number]). Discussed that this VUS should not change clinical management.  For more detailed discussion, please see genetic counseling documentation from 05/01/2017. Result report dated 04/26/2017.   

## 2017-06-11 ENCOUNTER — Other Ambulatory Visit: Payer: Self-pay

## 2017-06-11 MED ORDER — GABAPENTIN 300 MG PO CAPS: 600.0000 mg | ORAL_CAPSULE | Freq: Three times a day (TID) | ORAL | 3 refills | Status: DC

## 2017-06-11 NOTE — Telephone Encounter (Signed)
Pt left v/m requesting refill on gabapentin (pt takes tid). Pt needs medication on 06/12/17. Last refilled # 450 x3 on 05/21/16. Last annual 12/31/16. [facility].

## 2017-06-11 NOTE — [place] Notes (Signed)
Crystal Bass was seen today in the movement disorders clinic for neurologic consultation at the request of Lucille Passy, MD.  The consultation is for the evaluation of tremor.  Pt states that it has been going on over a year.  It is mostly in the R hand and rarely in the L hand.  "It is almost constant but not quite."  She notices it when grasping the steering wheel.  Eating was troublesome but better now that d/c the symbicort.  Symbicort worsened tremor by 80%.  Paternal aunt with tremor.  05/17/15 update:  Last visit, I diagnosed the patient with Parkinson's disease and started her on carbidopa/levodopa 25/100, one tablet 3 times per day.  She states that for the first 6 weeks she had diarrhea but it got better and it went away.  She does think that the tremor is better but does know that stress will trigger the tremor.  She also states that speech therapy seems to set off tremor.  She was started in physical therapy at [facility].  She has noted that she is waking up from dreams yelling and acting out dreams.  She does have a long history of migraine headaches and last visit had wanted me to prescribe Imitrex.  I was very nervous that her age and did not feel that she was likely a triptan candidate.  I ended up ordering an MRI of the brain on 02/28/2015.  I reviewed this and there was evidence of moderate small vessel disease.  I subsequently told her that she was not a candidate for the triptans.  She also had an MRI of the cervical spine on a performed, 2016.  There was mild multilevel cervical spondylosis.  Unfortunately, she has had several headaches since last visit that were fairly severe.  She did see Dr. [name] since last visit in regards to dysphagia and she states that it was scheduled but she ended up having to reschedule it.  08/23/15 update:  The patient is following up today.  She remains on carbidopa/levodopa 25/100, one tablet 3 times per day (8:30am/2pm/10PM).  She does have some  diarrhea that she associates with taking the pill but feels it is better than when she first started taking it.  She also states that it takes her until mid afternoon to feel normal and just feels slow in the morning.  She was started on clonazepam last visit for REM behavior disorder and that has helped dramatically.  She has had some spells of weakness in which her legs give out.  It may happen a few times a week.  She does not relate that to the addition of clonazepam at all.  No falls.  No hallucinations.  She was exercising until her back started hurting again.   In regards to her migraines, I gave her a prescription for Cambia last visit.  She is not a candidate for the triptans any longer.  She picked it up but hasn't had to use it yet.  She is on neurontin 600 mg bid for post surgical pain (states that she had mastectomy and open lung biopsy and then had terrible paresthesias).  Is still like that if doesn't take the neurontin, especially if she has a bra on.  She does complain that she thinks she is not thinking as clear as she used to.  11/28/14 update:  The patient is following up today.  Last visit, I increased her carbidopa/levodopa so that she is taking carbidopa/levodopa 25/100,  2 tablets in the morning, one in the afternoon and one in the evening.  I was hoping that this would help her morning "on."  Today, the patient states that she just has no energy and she is unsure if it is related to the medication; it started before the medication increase.  She thinks that the energy does get a bit better throughout the day.  She is on the klonopin and "I don't want to give that up as I don't dream anymore."  She has so little energy that she has not exercised much.  Does want to go back to PT at [facility].  She is not snoring at night that she knows of.  She's had no falls since last visit.  No hallucinations.  No lightheadedness or near syncope.  She has a has a history of rare migraine.  She does have  Cambia at home but has not had to use that.  She had a "bad" headache last night but got rid of it with tylenol, aleve, and coke.  Also c/o issues with BM's - both constipation and diarrhea.  03/01/16 update: The patient is following up today regarding her Parkinson's disease.  I have had the opportunity to review her prior records made available to me.  I last visit, she was complaining about loss of energy.  It was not sure that this was really related to her medications, but we decided to strategically hold medications.  She held her levodopa for about 4 days, because she thought that that was the culprit.  She felt no different in terms of energy off of the medication and ended up going back on the carbidopa/levodopa 25/100, but for some reason she went from 2 in the morning, one in the afternoon and one in the evening to 1 tablet three times a day (states that she didn't think that the higher dose made a difference).  We subsequently held her clonazepam and her energy may have been a little bit better, but she felt that it was certainly not worth being off of the medication, because she has terrible dreams and acting out the dreams off of the medications.  We decided subsequently to that selegiline for its amphetamine-like properties, but she decided not to take the medication after it was prescribed.  She reported that she wanted to try to increase her energy through exercise.  She is doing the BIG/LOUD exercises at home and walks some.  Brings in a [facility] [employer] form for exercise today.  No hallucination but rare visual distortion.  She has had some headaches but hasn't tried the cambia.    06/01/16 update:  The patient is following up today.  Last visit, I encouraged the patient to increase her levodopa back to 2 tablets in the morning, one in the afternoon and one evening, because every time she decreased it she became more rigid.  She states that she did that and she is doing "okay."  She also has a history  of REM behavior disorder and is on half a tablet of clonazepam at nighttime.  She denies falls since last visit.  No hallucinations.  May have occasional lightheadedness but no near syncope.  Thinks that she has "muscle weakness" but points to the neck and states that it is painful and then will feel like the head will tumble off.  Asks me about "posture PT" at [facility].    10/02/16 update:  Pt f/u today.  Pt is on carbidopa/levodopa 25/100, 2  in the AM, 1 in the afternoon and 1 in the evening.  Still on klonopin 0.5 mg, 1/2 tablet q hs.  No falls since our last visit.  No hallucinations.  No increased lightheadedness.  No syncope.  Mood has been fair.  Doing at least 1.25 miles per day on her [place] for exercise.  02/05/17 update:  Patient follows up today.  She is on carbidopa/levodopa 25/100, 2 tablets in the morning, one in the afternoon and one in the evening.  Reports that she has been doing well since our last visit.  Minimal tremor.  No falls.  No lightheadedness or near syncope.  She takes clonazepam, 0.5 mg, half tablet at night and does well with this.  She sleeps well.  No acting out of the dreams.  When asked about exercise, she states that "I was doing well until winter hit."  In regards to headache, she was having more than usual and was taking 4 tylenol per day but she stopped doing that.    06/12/17 update:  Patient seen in follow-up regarding her Parkinson's disease.  She remains on carbidopa/levodopa 25/100, 2 tablets in the morning, one in the afternoon and one in the evening.  She is also on clonazepam, 0.5 mg, half tablet at night.  She has had a few occasions of yelling out in the night.  Pt denies falls.  Pt has occasional lightheadedness and she will need to sit.  Its worse after she sits.    No hallucinations.  Mood has been good.  States that she is most concerned about cognition.  She will forget what she is saying in the middle of a sentence.  She is having some trouble with  forgetting names.  She puts her pills on the counter so she can see them and therefore she remembers them.  She remembers to pay bills.  She has no trouble driving.     PREVIOUS MEDICATIONS: none to date  ALLERGIES:   Allergies  Allergen Reactions  . Sulfonamide Derivatives Anaphylaxis  . Topamax [Topiramate] Other (See Comments)    Metabolic acidosis   . Biaxin [Clarithromycin] Other (See Comments)    pericarditis  . Hydrocodone Other (See Comments)    "hyper and climbing the walls"  . Motrin [Ibuprofen] Other (See Comments)    headaches  . Symbicort [Budesonide-Formoterol Fumarate]     02/07/15 tremor  . Percocet [Oxycodone-Acetaminophen] Itching and Rash  . Tape Itching and Rash    Use paper tape only  . Tetracyclines & Related Other (See Comments)    "immediate yeast infection"    CURRENT MEDICATIONS:  Outpatient Encounter Prescriptions as of 06/12/2017  Medication Sig  . acetaminophen (TYLENOL) 325 MG tablet Take 1,000 mg by mouth 2 (two) times daily as needed.   [name] aspirin 81 MG tablet Take 81 mg by mouth daily.    . carbidopa-levodopa ([name]) 25-100 MG tablet TAKE 2 TABLETS IN THE MORNING, 1 TABLET IN THE AFTERNOON, 1 IN THE EVENING  . clonazePAM (KLONOPIN) 0.5 MG tablet TAKE 0.5-1 TABLET BY MOUTH AT BEDTIME  . gabapentin ([name]) 300 MG capsule Take 2 capsules (600 mg total) by mouth 3 (three) times daily.  [name] losartan (COZAAR) 50 MG tablet TAKE 1 TABLET EVERY DAY  . metoprolol tartrate (LOPRESSOR) 25 MG tablet Take 1 tablet (25 mg total) by mouth 2 (two) times daily.  . Vitamin D, Ergocalciferol, ([name]) 50000 units CAPS capsule TAKE 1 CAPSULE BY MOUTH EVERY WEEK  . [DISCONTINUED] gabapentin ([name])  300 MG capsule 2 tablets in the morning, 1 in the afternoon and 2 in the evening (Patient taking differently: Take 600 mg by mouth 3 (three) times daily. )  . [DISCONTINUED] predniSONE (DELTASONE) 20 MG tablet Take 2 tablets (40 mg total) by mouth daily. For 5  days, then 1 tab daily for 5 days   No facility-administered encounter medications on file as of 06/12/2017.     PAST MEDICAL HISTORY:   Past Medical History:  Diagnosis Date  . 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 Aortic root aneurysm ([place])   . Breast cancer ([place]) 2002   infiltrative ductal carcinoma   . COPD (chronic obstructive pulmonary disease) ([place]) 9/08  . Ductal carcinoma of breast, estrogen receptor positive, stage 1 ([place]) 09/16/2012  . Endometriosis   . Hypercalcemia 09/14/2013  . Lesion of breast 1992   right, benign  . Lung disease    secondary to [name] infection  . Mastalgia 7/95  . Osteoporosis, post-menopausal 03/14/2012  . Ovarian cancer ([place]) 2004  . Pancreatitis    secondary to Cholelithiasis  . Parkinson disease ([place]) 02/10/15  . Post-thoracotomy pain syndrome   . Pseudomonas pneumonia ([place]) 5/09  . Uterine fibroid   . Vitamin D deficiency     PAST SURGICAL HISTORY:   Past Surgical History:  Procedure Laterality Date  . 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 APPENDECTOMY  2004  . BREAST BIOPSY  08/2004   right breast-benign  . BREAST LUMPECTOMY  1992   benign  . CARDIAC CATHETERIZATION  2007   essentially negation for significant CAD  . CHOLECYSTECTOMY  2001  . COLONOSCOPY  2014   Dr [name] , due 2019  . LUNG BIOPSY  2002   [name], Dr [name]  . MASTECTOMY MODIFIED RADICAL  2002   oral chemotheraphy (tamoxifen then Armidex) no radiation, Dr.[name]  . TONSILLECTOMY  1946    SOCIAL HISTORY:   Social History   Social History  . Marital status: Widowed    Spouse name: N/A  . Number of children: 0  . Years of education: N/A   Occupational History  . 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 Smoking status: Never Smoker  . Smokeless tobacco: Never Used  . Alcohol use No  . Drug use: No  . Sexual activity:  No     Comment: TAH/BSO   Other Topics Concern  . 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 Father Deceased       lung cancer  . Sister Alive       colon/breast cancer  . Mother Deceased       emphysema  . Brother Alive       healthy  . Ethlyn Daniels (Not Specified)  . Ethlyn Daniels (Not Specified)  . Mat Aunt Deceased  . Mat Uncle Deceased  . MGM Deceased  . MGF Deceased  . PGM Deceased  . PGF Deceased  . Mat Aunt Deceased  . Mat Uncle Deceased  . Mat Uncle Deceased  . Ethlyn Daniels (Not Specified)  . Cousin Deceased  . Cousin Deceased  . Cousin Deceased  . Neg Hx (Not Specified)  ROS:  A complete 10 system review of systems was obtained and was unremarkable apart from what is mentioned above.  PHYSICAL EXAMINATION:    VITALS:   Vitals:   06/12/17 1352  BP: 110/62  Pulse: 68  SpO2: 97%  Weight: 152 lb (68.9 kg)  Height: 5\' 2"  (1.575 m)    GEN:  The patient appears stated age and is in NAD. HEENT:  Normocephalic, atraumatic.  The mucous membranes are moist. The superficial temporal arteries are without ropiness or tenderness. CV:  RRR Lungs:  CTAB Neck/HEME:  There are no carotid bruits bilaterally.  Neurological examination:  Orientation:  Montreal Cognitive Assessment  10/02/2016  Visuospatial/ Executive (0/5) 4  Naming (0/3) 3  Attention: Read list of digits (0/2) 2  Attention: Read list of letters (0/1) 1  Attention: Serial 7 subtraction starting at 100 (0/3) 3  Language: Repeat phrase (0/2) 2  Language : Fluency (0/1) 1  Abstraction (0/2) 2  Delayed Recall (0/5) 3  Orientation (0/6) 6  Total 27  Adjusted Score (based on education) 27   Cranial nerves: There is good facial symmetry with the exception of the fact that when asked to raise the eyebrows, the right eyebrow raises further than the left.  There is mild facial hypomimia. The speech is fluent  and clear. Soft palate rises symmetrically and there is no tongue deviation. Hearing is intact to conversational tone. Sensation: Sensation is intact to light touch throughout. Motor: Strength is 5/5 in the bilateral upper and lower extremities.   Shoulder shrug is equal and symmetric.  There is no pronator drift.   Movement examination: Tone: There is minimal increased tone in the LUE Abnormal movements: There is no tremor today (seen or felt) Coordination:  There is no decremation with any form of RAMS, including alternating supination and pronation of the forearm, hand opening and closing, finger taps, heel taps and toe taps. Gait and Station: The patient has no difficulty arising out of a deep-seated chair without the use of the hands. The patient's stride length is normal, and good arm swing  LABS  Lab Results  Component Value Date   WBC 6.7 03/28/2017   HGB 13.7 03/28/2017   HCT 41.4 03/28/2017   MCV 95.1 03/28/2017   PLT 210 03/28/2017     Chemistry      Component Value Date/Time   NA 141 03/28/2017 1130   K 4.2 03/28/2017 1130   CL 101 12/27/2016 1426   CL 102 03/10/2013 1529   CO2 28 03/28/2017 1130   BUN 17.3 03/28/2017 1130   CREATININE 0.8 03/28/2017 1130      Component Value Date/Time   CALCIUM 9.6 03/28/2017 1130   ALKPHOS 85 03/28/2017 1130   AST 12 03/28/2017 1130   ALT <6 03/28/2017 1130   BILITOT 1.38 (H) 03/28/2017 1130       ASSESSMENT/PLAN:  1.   idiopathic Parkinson's disease.  The patient has tremor, bradykinesia, rigidity and mild postural instability.  Sx's have been present for at least since March, 2015 by the patients account today.  She is Hoehn and Yahr stage 2-2.5.    -She looks better on carbidopa/levodopa 25/100,  2/1/1.  Will continue that 2.  Mild dysphagia  -The patient states that this is no longer a problem 3.  Long hx migraine  -No longer a candidate for triptan therapy.  This is due to cerebral small vessel disease.  She does  have Cambia for when necessary use.  Encouraged her  to try it as she has been having more headaches.  Was nervous to try due to bad taste and told her that it is not nearly as bad as the headache feels. 5.  Mild orthostasis  -wonder if she needs both BP meds. Will ask her PCP if they are needed.    6.  Neck pain/hyperreflexia  -MRI cervical spine in April, 2016 just demonstrated mild multilevel cervical spondylosis. 7.  REM behavior disorder  -Doing well with low-dose clonazepam, 0.5 mg, half tablet at night.  Discussed risks, benefits, side effects.  Understanding is expressed.  This was refilled today.   8.  Mild cognitive impairment  -Talked to her again about the fact that I saw no evidence of dementia today, although she has become increasingly more aware of this.  We talked about neurocognitive testing.  We will schedule.  -talked about importance of mental and physical activities and importance of remaining social   9.  Follow-up in the next few months, sooner should new neurologic issues arise.  Much greater than 50% of this visit was spent in counseling and coordinating care.  Total face to face time:  30 min

## 2017-06-12 ENCOUNTER — Encounter: Payer: Self-pay | Admitting: Neurology

## 2017-06-12 ENCOUNTER — Telehealth: Payer: Self-pay | Admitting: Neurology

## 2017-06-12 ENCOUNTER — Ambulatory Visit (INDEPENDENT_AMBULATORY_CARE_PROVIDER_SITE_OTHER): Payer: Medicare Other | Admitting: Neurology

## 2017-06-12 VITALS — BP 110/62 | HR 68 | Ht 62.0 in | Wt 152.0 lb

## 2017-06-12 DIAGNOSIS — G4752 REM sleep behavior disorder: Secondary | ICD-10-CM

## 2017-06-12 DIAGNOSIS — G20 Parkinson's disease: Secondary | ICD-10-CM | POA: Diagnosis not present

## 2017-06-12 DIAGNOSIS — G3184 Mild cognitive impairment, so stated: Secondary | ICD-10-CM

## 2017-06-12 MED ORDER — CLONAZEPAM 0.5 MG PO TABS: ORAL_TABLET | ORAL | 1 refills | Status: DC

## 2017-06-12 MED ORDER — CARBIDOPA-LEVODOPA 25-100 MG PO TABS: ORAL_TABLET | ORAL | 1 refills | Status: DC

## 2017-06-12 NOTE — Addendum Note (Signed)
Addended byAnnamaria Helling on: 06/12/2017 03:24 PM   Modules accepted: Orders

## 2017-06-12 NOTE — Patient Instructions (Signed)
Patients age 74+:  You have been referred for a neurocognitive evaluation in our office.   The evaluation takes approximately two hours. The first part of the appointment is a clinical interview with the neuropsychologist (Dr. Macarthur Critchley). Please bring someone with you to this appointment if possible, as it is helpful for Dr. Si Raider to hear from both you and another adult who knows you well. After speaking with Dr. Si Raider, you will complete testing with her technician. The testing includes a variety of tasks- mostly question-and-answer, some paper-and-pencil. There is nothing you need to do to prepare for this appointment, but having a good night's sleep prior to the testing, and bringing eyeglasses and hearing aids (if you wear them), is advised.   About a week after the evaluation, you will return to follow up with Dr. Si Raider to review the test results. This appointment is about 30 minutes. If you would like a family member to receive this information as well, please bring them to the appointment.   We have to reserve several hours of the neuropsychologist's time and the psychometrician's time for your evaluation appointment. As such, please note that there is a No-Show fee of $100. If you are unable to attend any of your appointments, please contact our office as soon as possible to reschedule.    Patients age 32 and under:  You have been referred for a neurocognitive evaluation in our office.   The evaluation consists of three appointments.   1. The first appointment is about 45 minutes and is a clinical interview with the neuropsychologist (Dr. Macarthur Critchley). You can bring someone with you to this appointment, as it is helpful for Dr. Si Raider to hear from both you and another adult who knows you well.   2. The second appointment is 2-3 hours long and is with the psychometrician Milana Kidney). You will complete a variety of tasks- mostly question-and-answer, some paper-and-pencil,  some on the computer. There is nothing you need to do to prepare for this appointment, but having a good night's sleep prior to the testing, and bringing eyeglasses and hearing aids (if you wear them), is advised.   3. The final appointment is a follow-up with Dr. Si Raider where she will go over the test results with you and provide recommendations. This appointment is about 30 minutes.  If you would like a family member to receive this information as well, please bring them to the appointment.   We have to reserve several hours of the neuropsychologist's time and the psychometrician's time for your appointment. As such, please note that there is a No-Show fee of $100. If you are unable to attend any of your appointments, please contact our office as soon as possible to reschedule.

## 2017-06-12 NOTE — Telephone Encounter (Signed)
Jade, please let pt know that I spoke with Dr. Deborra Medina and she kindly said that we could cut back or d/c the losartan.  Have her cut it in half and keep track of her BP and also the feeling of "the bottom dropping out" (that is how she described her lightheadedness).  Call her in a week to check on her.

## 2017-06-12 NOTE — Telephone Encounter (Signed)
-----   Message from Lucille Passy, MD sent at 06/12/2017  3:49 PM EDT ----- Yes absolutely!  I would cut back or d/c the losartan first! ----- Message ----- From: Ludwig Clarks, DO Sent: 06/12/2017   2:26 PM To: Lucille Passy, MD  Talia, pt is experiencing orthostasis.  Does she need both metoprolol and losartan or can these be cut down or d/c?  I think that her PD is lowering her BP.  Thanks for your input!  Wells Guiles

## 2017-06-13 NOTE — Telephone Encounter (Signed)
Patient made aware. Will call in one week to see how she is doing.

## 2017-06-25 ENCOUNTER — Telehealth: Payer: Self-pay | Admitting: Neurology

## 2017-06-25 NOTE — Telephone Encounter (Signed)
Spoke with patient and she states after decreasing Losartan she had increased blood pressures and headaches. (175/110 on average) She went back up on her Losartan and doing better now.  She states that she isn't sure if the "falling out" feeling is better but she feels like this is more fatigue.  She is staying on current dose for now. FYI.

## 2017-06-25 NOTE — Telephone Encounter (Signed)
Left message on machine for patient to call back.   To see how she is doing after cutting losartan dose in half. Awaiting call back to discuss.

## 2017-07-08 ENCOUNTER — Encounter: Payer: Medicare Other | Admitting: Psychology

## 2017-08-15 ENCOUNTER — Ambulatory Visit (INDEPENDENT_AMBULATORY_CARE_PROVIDER_SITE_OTHER): Payer: Medicare Other | Admitting: Psychology

## 2017-08-15 ENCOUNTER — Encounter: Payer: Self-pay | Admitting: Psychology

## 2017-08-15 DIAGNOSIS — G20 Parkinson's disease: Secondary | ICD-10-CM

## 2017-08-15 DIAGNOSIS — R413 Other amnesia: Secondary | ICD-10-CM

## 2017-08-15 NOTE — [place] Notes (Signed)
   Neuropsychology Note  Crystal Bass came in today for 1 hour of neuropsychological testing with technician, Milana Kidney, BS, under the supervision of Dr. Macarthur Critchley. The patient did not appear overtly distressed by the testing session, per behavioral observation or via self-report to the technician. Rest breaks were offered. Crystal Bass will return within 2 weeks for a feedback session with Dr. Si Raider at which time her test performances, clinical impressions and treatment recommendations will be reviewed in detail. The patient understands she can contact our office should she require our assistance before this time.  Full report to follow.

## 2017-08-15 NOTE — Progress Notes (Signed)
NEUROPSYCHOLOGICAL INTERVIEW (CPT: [id number])  Name: Crystal Bass Date of Birth: October 09, 1941 Date of Interview: 08/15/2017  Reason for Referral:  Crystal Bass is a 76 y.o. female who is referred for neuropsychological evaluation by Dr. Wells Guiles Tat of [facility] due to concerns about memory loss. This patient is unaccompanied in the office for today's visit.  History of Presenting Problem:  Crystal Bass is followed by Dr. Carles Collet for idiopathic Parkinson's disease, diagnosed in early 2015. The patient is treated with Sinemet. She also takes clonazepam for REM sleep behavior disorder. She has reported memory changes more recently. Last MoCA was performed on 10/02/2016 and was 27/30. The patient was last seen by Dr. Carles Collet on 06/12/2017.  At today's visit (08/15/2017), the patient reports gradual onset of memory changes within the last year, only mild progression over time if any. She reported difficulty with retrieval of names of people she knows well. She also endorsed general word finding difficulty. She may be having slower processing of information, she is not sure. She has some difficulty with concentrating when distractions are present, but this is not new. She denied any forgetfulness for recent conversations/events, misplacement/loss of items, missing appointments, missing bill payments, difficulty with staying on task, comprehension difficulty or navigational problems when driving.   Psychiatric history was denied. She reports she is a very optimistic person by nature but has been "slightly on the downside now". She is not as excited to engage with people/activities.   Family history is significant for PD in a first cousin. There is no known family history of dementia.  Current Functioning: She continues to manage all complex ADLs (driving, medications, appointments, finances) without any difficulty. She lives in [place].  She complains of lack of energy. She has a difficult time  "to get going" in the morning. She takes occasional naps, she dozes off in her easy chair during the day. She feels her best at about 4 pm.   She has not had any falls.   She typically sleeps well as night. She reports the clonazepam is hugely beneficial for her REM sleep behaviors.   She does not have visual illusions or hallucinations. Early on in her diagnosis of PD she thought she saw things out of the corner of her eye, but this has not been occurring lately.   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  Marital history: Widowed, no children but has an "unofficially adopted son", a former Ship broker of her late husband, who she is very close with and who she talks to on the phone weekly. He currently lives in [place]. She also is close with her nephew and 3 nieces and a great niece and nephew. Alcohol: None Tobacco: Never used, never a smoker   Medical History: Past Medical History:  Diagnosis Date  . 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 Aortic root aneurysm ([place])   . Breast cancer ([place]) 2002   infiltrative ductal carcinoma   . COPD (chronic obstructive pulmonary disease) ([place]) 9/08  . Ductal carcinoma of breast, estrogen receptor positive, stage 1 ([place]) 09/16/2012  . Endometriosis   . Hypercalcemia 09/14/2013  . Lesion of breast 1992   right, benign  . Lung disease  secondary to [name] infection  . Mastalgia 7/95  . Osteoporosis, post-menopausal 03/14/2012  . Ovarian cancer ([place]) 2004  . Pancreatitis    secondary to Cholelithiasis  . Parkinson disease ([place]) 02/10/15  . Post-thoracotomy pain syndrome   . Pseudomonas pneumonia ([place]) 5/09  . Uterine fibroid   . Vitamin D deficiency        Current Medications:  Outpatient Encounter Prescriptions as of 08/15/2017  Medication Sig  . acetaminophen (TYLENOL) 325 MG tablet Take 1,000 mg by mouth 2 (two) times daily as needed.   [name] aspirin 81 MG tablet Take 81 mg by mouth daily.    . carbidopa-levodopa ([name]) 25-100 MG tablet TAKE 2 TABLETS IN THE MORNING, 1 TABLET IN THE AFTERNOON, 1 IN THE EVENING  . clonazePAM (KLONOPIN) 0.5 MG tablet TAKE 0.5-1 TABLET BY MOUTH AT BEDTIME  . gabapentin ([name]) 300 MG capsule Take 2 capsules (600 mg total) by mouth 3 (three) times daily.  [name] losartan (COZAAR) 50 MG tablet TAKE 1 TABLET EVERY DAY  . metoprolol tartrate (LOPRESSOR) 25 MG tablet Take 1 tablet (25 mg total) by mouth 2 (two) times daily.  . Vitamin D, Ergocalciferol, ([name]) 50000 units CAPS capsule TAKE 1 CAPSULE BY MOUTH EVERY WEEK   No facility-administered encounter medications on file as of 08/15/2017.      Behavioral Observations:   Appearance: Neatly, casually and appropriately dressed and groomed. Very mild resting tremor observed in the right hand. Gait: Ambulated independently, no gross abnormalities observed Speech: Fluent; normal rate, rhythm and volume. No significant word finding difficulty demonstrated during conversational speech. Mildly increased response latencies. Thought process: Linear, goal directed Affect: Full, euthymic Interpersonal: Pleasant, appropriate   TESTING: There is medical necessity to proceed with neuropsychological assessment as the results will be used to aid in differential diagnosis and clinical decision-making and to inform specific treatment recommendations. Per the patient and medical records reviewed, there has been a change in cognitive functioning and a reasonable suspicion of neurocognitive disorder (e.g. MCI due to Parkinson's disease).  Following the clinical interview, the patient completed a full battery of neuropsychological testing with my psychometrician under  my supervision.   PLAN: The patient will return to see me for a follow-up session at which time her test performances and my impressions and treatment recommendations will be reviewed in detail.  Full report to follow.

## 2017-08-26 ENCOUNTER — Ambulatory Visit: Payer: Medicare Other | Admitting: Obstetrics & Gynecology

## 2017-08-26 NOTE — Progress Notes (Deleted)
76 y.o. G0P0000 Widowed Caucasian F here for annual exam.    No LMP recorded. Patient has had a hysterectomy.          Sexually active: {yes no:314532}  The current method of family planning is status post hysterectomy.    Exercising: {yes no:314532}  {types:19826} Smoker:  no  Health Maintenance: Pap:  10/06/08 wnl  History of abnormal Pap:  no MMG:  06/18/16 BIRADS 1 negative  Colonoscopy:  03/03/13 normal- repeat 5 years  BMD:   02/22/16 osteopenia  TDaP:  01/01/11  Pneumonia vaccine(s):  11/26/06, 10/21/12, 02/12/14  Zostavax:   05/26/07  Hep C testing: not indicated  Screening Labs: ***, Hb today: ***, Urine today: ***   reports that she has never smoked. She has never used smokeless tobacco. She reports that she does not drink alcohol or use drugs.  Past Medical History:  Diagnosis Date  . 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 Aortic root aneurysm ([place])   . Breast cancer ([place]) 2002   infiltrative ductal carcinoma   . COPD (chronic obstructive pulmonary disease) ([place]) 9/08  . Ductal carcinoma of breast, estrogen receptor positive, stage 1 ([place]) 09/16/2012  . Endometriosis   . Hypercalcemia 09/14/2013  . Lesion of breast 1992   right, benign  . Lung disease    secondary to [name] infection  . Mastalgia 7/95  . Osteoporosis, post-menopausal 03/14/2012  . Ovarian cancer ([place]) 2004  . Pancreatitis    secondary to Cholelithiasis  . Parkinson disease ([place]) 02/10/15  . Post-thoracotomy pain syndrome   . Pseudomonas pneumonia ([place]) 5/09  . Uterine fibroid   . Vitamin D deficiency     Past Surgical History:  Procedure Laterality Date  . 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 APPENDECTOMY  2004  . BREAST BIOPSY  08/2004   right breast-benign  . BREAST LUMPECTOMY  1992   benign  . CARDIAC CATHETERIZATION  2007   essentially negation for significant CAD  . CHOLECYSTECTOMY  2001  . COLONOSCOPY  2014   Dr  [name] , due 2019  . LUNG BIOPSY  2002   [name], Dr [name]  . MASTECTOMY MODIFIED RADICAL  2002   oral chemotheraphy (tamoxifen then Armidex) no radiation, Dr.[name]  . TONSILLECTOMY  1946    Current Outpatient Prescriptions  Medication Sig Dispense Refill  . acetaminophen (TYLENOL) 325 MG tablet Take 1,000 mg by mouth 2 (two) times daily as needed.     [name] aspirin 81 MG tablet Take 81 mg by mouth daily.      . carbidopa-levodopa ([name]) 25-100 MG tablet TAKE 2 TABLETS IN THE MORNING, 1 TABLET IN THE AFTERNOON, 1 IN THE EVENING 360 tablet 1  . clonazePAM (KLONOPIN) 0.5 MG tablet TAKE 0.5-1 TABLET BY MOUTH AT BEDTIME 90 tablet 1  . gabapentin ([name]) 300 MG capsule Take 2 capsules (600 mg total) by mouth 3 (three) times daily. 450 capsule 3  . losartan (COZAAR) 50 MG tablet TAKE 1 TABLET EVERY DAY 90 tablet 3  . metoprolol tartrate (LOPRESSOR) 25 MG tablet Take 1 tablet (25 mg total) by mouth 2 (two) times daily. 180 tablet 2  . Vitamin D, Ergocalciferol, ([name]) 50000 units CAPS capsule TAKE 1 CAPSULE BY MOUTH EVERY WEEK 12 capsule 4   No current facility-administered medications for this visit.     Family History  Problem Relation Age  of Onset  . Lung cancer Father 34       d.82 history of smoking  . Breast cancer Sister 72  . Colon cancer Sister   . Emphysema Mother        d.64 was never a smoker  . Stroke Paternal Aunt        in mid 76s  . Osteoporosis Paternal Aunt   . Melanoma Maternal Aunt   . Cancer Maternal Uncle        unspecified type  . Cancer Maternal Grandmother        d.82s unspecified GI cancer  . Cancer Maternal Grandfather        d.62s unspecified type  . Liver cancer Maternal Aunt   . Cancer Maternal Uncle        unspecified type  . Cancer Maternal Uncle        unspecified type  . Myasthenia gravis Paternal Aunt   . Breast cancer Cousin        d.60s-daughter of unaffected paternal aunt Eustace Moore  . Colon cancer Cousin 45       d.70-daughter of  unaffected maternal aunt Leila  . Lung cancer Cousin 70       d.70-sisters to each other, both daughters of maternal uncle Johnny  . Diabetes Neg Hx   . Heart disease Neg Hx     ROS:  Pertinent items are noted in HPI.  Otherwise, a comprehensive ROS was negative.  Exam:   There were no vitals taken for this visit.  Weight change: @WEIGHTCHANGE @ Height:      Ht Readings from Last 3 Encounters:  06/12/17 5\' 2"  (1.575 m)  03/28/17 5' 2.5" (1.588 m)  02/05/17 5' 2.5" (1.588 m)    General appearance: alert, cooperative and appears stated age Head: Normocephalic, without obvious abnormality, atraumatic Neck: no adenopathy, supple, symmetrical, trachea midline and thyroid {EXAM; THYROID:18604} Lungs: clear to auscultation bilaterally Breasts: {Exam; breast:13139::"normal appearance, no masses or tenderness"} Heart: regular rate and rhythm Abdomen: soft, non-tender; bowel sounds normal; no masses,  no organomegaly Extremities: extremities normal, atraumatic, no cyanosis or edema Skin: Skin color, texture, turgor normal. No rashes or lesions Lymph nodes: Cervical, supraclavicular, and axillary nodes normal. No abnormal inguinal nodes palpated Neurologic: Grossly normal   Pelvic: External genitalia:  no lesions              Urethra:  normal appearing urethra with no masses, tenderness or lesions              Bartholins and Skenes: normal                 Vagina: normal appearing vagina with normal color and discharge, no lesions              Cervix: {exam; cervix:14595}              Pap taken: {yes no:314532} Bimanual Exam:  Uterus:  {exam; uterus:12215}              Adnexa: {exam; adnexa:12223}               Rectovaginal: Confirms               Anus:  normal sphincter tone, no lesions  Chaperone was present for exam.  A:  Well Woman with normal exam  P:   {plan; gyn:5269::"mammogram","pap smear","return annually or prn"}

## 2017-09-03 NOTE — Progress Notes (Deleted)
NEUROPSYCHOLOGICAL EVALUATION   Name:    Crystal Bass  Date of Birth:   Jan 27, 1941 Date of Interview:  08/15/2017 Date of Testing:  08/15/2017   Date of Feedback:  09/05/2017       Background Information:  Reason for Referral:  Crystal Bass is a 76 y.o. female referred by Dr. Wells Guiles Tat to assess her current level of cognitive functioning and assist in differential diagnosis. The current evaluation consisted of a review of available medical records, an interview with the patient, and the completion of a neuropsychological testing battery. Informed consent was obtained.  History of Presenting Problem:  [name] is followed by Dr. Carles Collet for idiopathic Parkinson's disease, diagnosed in early 2015. The patient is treated with Sinemet. She also takes clonazepam for REM sleep behavior disorder. She has reported memory changes more recently. Last MoCA was performed on 10/02/2016 and was 27/30. The patient was last seen by Dr. Carles Collet on 06/12/2017.  At today's visit (08/15/2017), the patient reports gradual onset of memory changes within the last year, only mild progression over time if any. She reported difficulty with retrieval of names of people she knows well. She also endorsed general word finding difficulty. She may be having slower processing of information, she is not sure. She has some difficulty with concentrating when distractions are present, but this is not new. She denied any forgetfulness for recent conversations/events, misplacement/loss of items, missing appointments, missing bill payments, difficulty with staying on task, comprehension difficulty or navigational problems when driving.   Psychiatric history was denied. She reports she is a very optimistic person by nature but has been "slightly on the downside now". She is not as excited to engage with people/activities.   Family history is significant for PD in a first cousin. There is no known family history of  dementia.  Current Functioning: She continues to manage all complex ADLs (driving, medications, appointments, finances) without any difficulty. She lives in [place].  She complains of lack of energy. She has a difficult time "to get going" in the morning. She takes occasional naps, she dozes off in her easy chair during the day. She feels her best at about 4 pm.   She has not had any falls.   She typically sleeps well as night. She reports the clonazepam is hugely beneficial for her REM sleep behaviors.   She does not have visual illusions or hallucinations. Early on in her diagnosis of PD she thought she saw things out of the corner of her eye, but this has not been occurring lately.   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  Marital history: Widowed, no children but has an "unofficially adopted son", a former Ship broker of her late husband, who she is very close with and who she talks to on the phone weekly. He currently lives in [place]. She also is close with her nephew and 3 nieces and a great niece and nephew. Alcohol: None Tobacco: Never used, never a smoker   Medical History:  Past Medical History:  Diagnosis Date  . 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 Aortic root aneurysm ([place])   . Breast cancer ([place]) 2002   infiltrative ductal carcinoma   .  COPD (chronic obstructive pulmonary disease) ([place]) 9/08  . Ductal carcinoma of breast, estrogen receptor positive, stage 1 ([place]) 09/16/2012  . Endometriosis   . Hypercalcemia 09/14/2013  . Lesion of breast 1992   right, benign  . Lung disease    secondary to [name] infection  . Mastalgia 7/95  . Osteoporosis, post-menopausal 03/14/2012   . Ovarian cancer ([place]) 2004  . Pancreatitis    secondary to Cholelithiasis  . Parkinson disease ([place]) 02/10/15  . Post-thoracotomy pain syndrome   . Pseudomonas pneumonia ([place]) 5/09  . Uterine fibroid   . Vitamin D deficiency     Current medications:  Outpatient Encounter Prescriptions as of 09/05/2017  Medication Sig  . acetaminophen (TYLENOL) 325 MG tablet Take 1,000 mg by mouth 2 (two) times daily as needed.   [name] aspirin 81 MG tablet Take 81 mg by mouth daily.    . carbidopa-levodopa ([name]) 25-100 MG tablet TAKE 2 TABLETS IN THE MORNING, 1 TABLET IN THE AFTERNOON, 1 IN THE EVENING  . clonazePAM (KLONOPIN) 0.5 MG tablet TAKE 0.5-1 TABLET BY MOUTH AT BEDTIME  . gabapentin ([name]) 300 MG capsule Take 2 capsules (600 mg total) by mouth 3 (three) times daily.  [name] losartan (COZAAR) 50 MG tablet TAKE 1 TABLET EVERY DAY  . metoprolol tartrate (LOPRESSOR) 25 MG tablet Take 1 tablet (25 mg total) by mouth 2 (two) times daily.  . Vitamin D, Ergocalciferol, ([name]) 50000 units CAPS capsule TAKE 1 CAPSULE BY MOUTH EVERY WEEK   No facility-administered encounter medications on file as of 09/05/2017.      Current Examination:  Behavioral Observations:  Appearance: Neatly, casually and appropriately dressed and groomed. Very mild resting tremor observed in the right hand. Gait: Ambulated independently, no gross abnormalities observed Speech: Fluent; normal rate, rhythm and volume. No significant word finding difficulty demonstrated during conversational speech. Mildly increased response latencies. Thought process: Linear, goal directed Affect: Full, euthymic Interpersonal: Pleasant, appropriate Orientation: Oriented to all spheres. Accurately named the current President and his predecessor.   Tests Administered: . Test of Premorbid Functioning (TOPF) . Wechsler Adult Intelligence Scale-Fourth Edition (WAIS-IV): Similarities, Music therapist, Coding and Digit Span  subtests . Wechsler Memory Scale-Fourth Edition (WMS-IV) Older Adult Version (ages 74-90): Logical Memory I, II and Recognition subtests  . [employer] Verbal Learning Test - 2nd Edition (CVLT-2) Short Form . Repeatable Battery for the Assessment of Neuropsychological Status (RBANS) Form A:  Figure Copy and Recall subtests and Semantic Fluency subtest . Boston Diagnostic Aphasia Examination: Complex Ideational Material subtest . Controlled Oral Word Association Test (COWAT) . Trail Making Test A and B . Clock drawing test . [place] (BNT) . Geriatric Depression Scale (GDS) 15 Item . Generalized Anxiety Disorder - 7 item screener (GAD-7) . Parkinson's Disease Questionnaire (PDQ-39)  Test Results: Note: Standardized scores are presented only for use by appropriately trained professionals and to allow for any future test-retest comparison. These scores should not be interpreted without consideration of all the information that is contained in the rest of the report. The most recent standardization samples from the test publisher or other sources were used whenever possible to derive standard scores; scores were corrected for age, gender, ethnicity and education when available.   Test Scores:  ***  Description of Test Results:  Premorbid verbal intellectual abilities were estimated to have been within the high average range based on a test of word reading. Psychomotor processing speed was average. Auditory attention and working memory were high average to average,  respectively. Visual-spatial construction was average to high average. Language abilities were intact. Specifically, confrontation naming was average, and semantic verbal fluency was average. Auditory comprehension of complex ideational material was intact. With regard to verbal memory, encoding and acquisition of non-contextual information (i.e., word list) was average. After a brief distracter task, free recall was average.  After a delay, free recall was average. Cued recall was average. Performance on a yes/no recognition task was average with 100% accuracy. On another verbal memory test, encoding and acquisition of contextual auditory information (i.e., short stories) was high average. After a delay, free recall was average. Performance on a yes/no recognition task was above average. With regard to non-verbal memory, delayed free recall of visual information was average. Executive functioning was intact overall. Mental flexibility and set-shifting were average on Trails B. Verbal fluency with phonemic search restrictions was average. Verbal abstract reasoning was average. Performance on a clock drawing task was normal. On self-report measures of mood, the patient's responses were not indicative of clinically significant depression or anxiety at the present time. On a self report measure assessing the impact of PD symptoms on quality of life and daily functioning, the patient reported the most concerns about cognitive impairment. She also endorsed mild bodily discomfort and issues with mobility. She denied any significant issues with emotional wellbeing, ADLs, communication or social support.  Clinical Impressions: Diagnosis deferred. No sign of cognitive impairment or psychiatric disorder. Results of cognitive testing were entirely within normal limits and generally commensurate with estimated premorbid intellectual abilities. Specifically, processing speed, auditory attention, language, visual spatial skills, learning and memory, and executive functioning were all normal, with most scores falling in the average to high average range. There were no impaired performances on testing. There is no evidence to suggest the presence of a neurocognitive disorder, including MCI or dementia, at this time. There is also no evidence of a primary psychiatric disorder. It is most likely that the patient's subjective cognitive complaints are  secondary to normal aging or very subtle, sub-clinical changes in cognitive function due to PD.  Recommendations/Plan: Based on the findings of the present evaluation, the following recommendations are offered:  --The patient will be reassured that her testing results are entirely within normal limits and not indicative of any underlying cognitive disorder or dementia. I reviewed strategies to maintain brain health, including cardiovascular exercise, mental stimulation and social engagement.  --These results will serve as a nice baseline for future comparison if cognitive decline is observed or reported in the future.   Feedback to Patient: NAIJA TROOST returned for a feedback appointment on 09/05/2017 to review the results of her neuropsychological evaluation with this provider. *** minutes face-to-face time was spent reviewing her test results, my impressions and my recommendations as detailed above.    Total time spent on this patient's case: 90791x1 unit for interview with psychologist; [phone] units of testing by psychometrician under psychologist's supervision; [phone] units for medical record review, scoring of neuropsychological tests, interpretation of test results, preparation of this report, and review of results to the patient by psychologist.      Thank you for your referral of ADREANA COULL. Please feel free to contact me if you have any questions or concerns regarding this report.

## 2017-09-05 ENCOUNTER — Encounter: Payer: Medicare Other | Admitting: Psychology

## 2017-09-12 NOTE — Progress Notes (Signed)
NEUROPSYCHOLOGICAL EVALUATION   Name:    Crystal Bass  Date of Birth:   23-Mar-1941 Date of Interview:  08/15/2017 Date of Testing:  08/15/2017   Date of Feedback:  09/17/2017       Background Information:  Reason for Referral:  Crystal Bass is a 76 y.o. female referred by Dr. Wells Guiles Tat to assess her current level of cognitive functioning and assist in differential diagnosis. The current evaluation consisted of a review of available medical records, an interview with the patient, and the completion of a neuropsychological testing battery. Informed consent was obtained.  History of Presenting Problem:  Crystal Bass is followed by Dr. Carles Collet for idiopathic Parkinson's disease, diagnosed in early 2015. The patient is treated with Sinemet. She also takes clonazepam for REM sleep behavior disorder. She has reported memory changes more recently. Last MoCA was performed on 10/02/2016 and was 27/30. The patient was last seen by Dr. Carles Collet on 06/12/2017.   At today's visit (08/15/2017), the patient reports gradual onset of memory changes within the last year, only mild progression over time if any. She reported difficulty with retrieval of names of people she knows well. She also endorsed general word finding difficulty. She may be having slower processing of information, she is not sure. She has some difficulty with concentrating when distractions are present, but this is not new. She denied any forgetfulness for recent conversations/events, misplacement/loss of items, missing appointments, missing bill payments, difficulty with staying on task, comprehension difficulty or navigational problems when driving.    Psychiatric history was denied. She reports she is a very optimistic person by nature but has been "slightly on the downside now". She is not as excited to engage with people/activities.    Family history is significant for PD in a first cousin. There is no known family history of  dementia.   Current Functioning: She continues to manage all complex ADLs (driving, medications, appointments, finances) without any difficulty. She lives in [place].   She complains of lack of energy. She has a difficult time "to get going" in the morning. She takes occasional naps, she dozes off in her easy chair during the day. She feels her best at about 4 pm.    She has not had any falls.    She typically sleeps well as night. She reports the clonazepam is hugely beneficial for her REM sleep behaviors.    She does not have visual illusions or hallucinations. Early on in her diagnosis of PD she thought she saw things out of the corner of her eye, but this has not been occurring lately.     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  Marital history: Widowed, no children but has an "unofficially adopted son", a former Ship broker of her late husband, who she is very close with and who she talks to on the phone weekly. He currently lives in [place]. She also is close with her nephew and 3 nieces and a great niece and nephew. Alcohol: None Tobacco: Never used, never a smoker   Medical History:  Past Medical History:  Diagnosis Date  . 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 Aortic root aneurysm ([place])   . Breast  cancer ([place]) 2002   infiltrative ductal carcinoma   . COPD (chronic obstructive pulmonary disease) ([place]) 9/08  . Ductal carcinoma of breast, estrogen receptor positive, stage 1 ([place]) 09/16/2012  . Endometriosis   . Hypercalcemia 09/14/2013  . Lesion of breast 1992   right, benign  . Lung disease    secondary to [name] infection  . Mastalgia 7/95  . Osteoporosis, post-menopausal 03/14/2012   . Ovarian cancer ([place]) 2004  . Pancreatitis    secondary to Cholelithiasis  . Parkinson disease ([place]) 02/10/15  . Post-thoracotomy pain syndrome   . Pseudomonas pneumonia ([place]) 5/09  . Uterine fibroid   . Vitamin D deficiency     Current medications:  Outpatient Encounter Prescriptions as of 09/17/2017  Medication Sig  . acetaminophen (TYLENOL) 325 MG tablet Take 1,000 mg by mouth 2 (two) times daily as needed.   [name] aspirin 81 MG tablet Take 81 mg by mouth daily.    . carbidopa-levodopa ([name]) 25-100 MG tablet TAKE 2 TABLETS IN THE MORNING, 1 TABLET IN THE AFTERNOON, 1 IN THE EVENING  . clonazePAM (KLONOPIN) 0.5 MG tablet TAKE 0.5-1 TABLET BY MOUTH AT BEDTIME  . gabapentin ([name]) 300 MG capsule Take 2 capsules (600 mg total) by mouth 3 (three) times daily.  [name] losartan (COZAAR) 50 MG tablet TAKE 1 TABLET EVERY DAY  . metoprolol tartrate (LOPRESSOR) 25 MG tablet Take 1 tablet (25 mg total) by mouth 2 (two) times daily.  . Vitamin D, Ergocalciferol, ([name]) 50000 units CAPS capsule TAKE 1 CAPSULE BY MOUTH EVERY WEEK   No facility-administered encounter medications on file as of 09/17/2017.      Current Examination:  Behavioral Observations:  Appearance: Neatly, casually and appropriately dressed and groomed. Very mild resting tremor observed in the right hand. Gait: Ambulated independently, no gross abnormalities observed Speech: Fluent; normal rate, rhythm and volume. No significant word finding difficulty demonstrated during conversational speech. Mildly increased response latencies. Thought process: Linear, goal directed Affect: Full, euthymic Interpersonal: Pleasant, appropriate Orientation: Oriented to all spheres. Accurately named the current President and his predecessor.   Tests Administered: . Test of Premorbid Functioning (TOPF) . Wechsler Adult Intelligence Scale-Fourth Edition (WAIS-IV): Similarities, Music therapist, Coding and Digit Span  subtests . Wechsler Memory Scale-Fourth Edition (WMS-IV) Older Adult Version (ages 8-90): Logical Memory I, II and Recognition subtests  . [employer] Verbal Learning Test - 2nd Edition (CVLT-2) Short Form . Repeatable Battery for the Assessment of Neuropsychological Status (RBANS) Form A:  Figure Copy and Recall subtests and Semantic Fluency subtest . Boston Diagnostic Aphasia Examination: Complex Ideational Material subtest . Controlled Oral Word Association Test (COWAT) . Trail Making Test A and B . Clock drawing test . [place] (BNT) . Geriatric Depression Scale (GDS) 15 Item . Generalized Anxiety Disorder - 7 item screener (GAD-7) . Parkinson's Disease Questionnaire (PDQ-39)  Test Results: Note: Standardized scores are presented only for use by appropriately trained professionals and to allow for any future test-retest comparison. These scores should not be interpreted without consideration of all the information that is contained in the rest of the report. The most recent standardization samples from the test publisher or other sources were used whenever possible to derive standard scores; scores were corrected for age, gender, ethnicity and education when available.   Test Scores:  Test Name Raw Score Standardized Score Descriptor  TOPF 59/70 SS= 116 High average  WAIS-IV Subtests     Similarities 26/36 ss= 11 Average  Block Design 34/66 ss= 11  Average  Coding 38/135 ss= 8 Average  Digit Span Forward 11/16 ss= 12 High average  Digit Span Backward 7/16 ss= 9 Average  WMS-IV Subtests     LM I 38/53 ss= 13 High average  LM II 21/39 ss= 11 Average  LM II Recognition 22/23 Cum %: >75 Above average  RBANS Subtests     Figure Copy 20/20 Z= 1.2 High average  Figure Recall 10/20 Z= -0.6 Average  Semantic Fluency 15 Z= -0.9 Low average  CVLT-II Scores     Trial 1 6/9 Z= 0 Average  Trial 4 7/9 Z= -0.5 Average  Trials 1-4 total 27/36 T= 51 Average  SD Free Recall 7/9 Z= 0  Average  LD Free Recall 7/9 Z= 0.5 Average  LD Cued Recall 7/9 Z= 0 Average  Recognition Discriminability 9/9 hits, 0 false positives Z= 0.5 Average  Forced Choice Recognition 9/9  WNL  BDAE Subtest     Complex Ideational Material 12/12  WNL  COWAT-FAS 40 T= 48 Average  COWAT-Animals 16 T= 45 Average  Trail Making Test A  50" 0 errors T= 44 Average  Trail Making Test B  114" 1 error T= 50 Average  Clock Drawing   WNL  BNT 57/60 T= 55 Average  GDS-15 1/15  WNL  GAD-7 0/21  WNL  PDQ-39     Mobility 30%    Activities of Daily Living 8.33%    Emotional Well Being 4.16%    Stigma 12.5%    Social Support 8.33%    Cognitive Impairment 37.5%    Communication 0%    Bodily Discomfort 33.3%        Description of Test Results:  Premorbid verbal intellectual abilities were estimated to have been within the high average range based on a test of word reading. Psychomotor processing speed was average. Auditory attention and working memory were high average to average, respectively. Visual-spatial construction was average to high average. Language abilities were intact. Specifically, confrontation naming was average, and semantic verbal fluency was average. Auditory comprehension of complex ideational material was intact. With regard to verbal memory, encoding and acquisition of non-contextual information (i.e., word list) was average. After a brief distracter task, free recall was average. After a delay, free recall was average. Cued recall was average. Performance on a yes/no recognition task was average with 100% accuracy. On another verbal memory test, encoding and acquisition of contextual auditory information (i.e., short stories) was high average. After a delay, free recall was average. Performance on a yes/no recognition task was above average. With regard to non-verbal memory, delayed free recall of visual information was average. Executive functioning was intact overall. Mental flexibility  and set-shifting were average on Trails B. Verbal fluency with phonemic search restrictions was average. Verbal abstract reasoning was average. Performance on a clock drawing task was normal. On self-report measures of mood, the patient's responses were not indicative of clinically significant depression or anxiety at the present time. On a self report measure assessing the impact of PD symptoms on quality of life and daily functioning, the patient reported the most concerns about cognitive impairment. She also endorsed mild bodily discomfort and issues with mobility. She denied any significant issues with emotional wellbeing, ADLs, communication or social support.  Clinical Impressions: Diagnosis deferred. No sign of cognitive impairment or psychiatric disorder. Results of cognitive testing were entirely within normal limits and generally commensurate with estimated premorbid intellectual abilities. Specifically, processing speed, auditory attention, language, visual spatial skills, learning and memory, and  executive functioning were all normal, with most scores falling in the average to high average range. There were no impaired performances on testing. There is no evidence to suggest the presence of a neurocognitive disorder, including MCI or dementia, at this time. There is also no evidence of a primary psychiatric disorder. It is most likely that the patient's subjective cognitive complaints are secondary to normal aging or very subtle, sub-clinical changes in cognitive function due to PD.   Recommendations/Plan: Based on the findings of the present evaluation, the following recommendations are offered:   --The patient was reassured that her testing results are entirely within normal limits and not indicative of any underlying cognitive disorder or dementia. I reviewed strategies to maintain brain health, including cardiovascular exercise, mental stimulation and social engagement.  --These results will  serve as a nice baseline for future comparison if cognitive decline is observed or reported in the future.   Feedback to Patient: Crystal Bass returned for a feedback appointment on 09/17/2017 to review the results of her neuropsychological evaluation with this provider. 15 minutes face-to-face time was spent reviewing her test results, my impressions and my recommendations as detailed above. The patient does continue to feel that she has experienced cognitive decline, despite normal testing results. I explained that she may be experiencing some subtle age-related or PD-related cognitive changes but that her cognitive abilities are not impaired and in fact are above average in many domains. I explained that we can do re-evaluation in the future to see if there is any objective evidence of decline over time.   Total time spent on this patient's case: [id number] unit for interview with psychologist; [phone] units of testing by psychometrician under psychologist's supervision; [phone] units for medical record review, scoring of neuropsychological tests, interpretation of test results, preparation of this report, and review of results to the patient by psychologist.      Thank you for your referral of Crystal Bass. Please feel free to contact me if you have any questions or concerns regarding this report.

## 2017-09-16 ENCOUNTER — Encounter: Payer: Self-pay | Admitting: Obstetrics & Gynecology

## 2017-09-16 ENCOUNTER — Ambulatory Visit (INDEPENDENT_AMBULATORY_CARE_PROVIDER_SITE_OTHER): Payer: Medicare Other | Admitting: Obstetrics & Gynecology

## 2017-09-16 ENCOUNTER — Other Ambulatory Visit: Payer: Self-pay | Admitting: Obstetrics & Gynecology

## 2017-09-16 VITALS — BP 134/78 | HR 70 | Resp 24 | Ht 62.5 in | Wt 148.0 lb

## 2017-09-16 DIAGNOSIS — Z01419 Encounter for gynecological examination (general) (routine) without abnormal findings: Secondary | ICD-10-CM | POA: Diagnosis not present

## 2017-09-16 DIAGNOSIS — Z1231 Encounter for screening mammogram for malignant neoplasm of breast: Secondary | ICD-10-CM

## 2017-09-16 NOTE — Progress Notes (Signed)
Patient scheduled for 3D MMG at [facility] on Monday 10/07/17 at 3PM. Patient in office when scheduled and agreeable to date and time of appointment.

## 2017-09-16 NOTE — Progress Notes (Addendum)
76 y.o. G0P0000 WidowedCaucasianF here for annual exam.  Having neuropsychological testing with neurology.  Finding out results of this tomorrow.  States she's not really sure she wants to know the answers and she knows her memory is changing.  She's worked to get all of her affairs in order but still finds this a little scary.  PCP has left [place] and she's not sure she wants to drive further for PCP.  Asks for suggestions.  Denies vaginal bleeding.       No LMP recorded. Patient has had a hysterectomy.          Sexually active: No.  The current method of family planning is status post hysterectomy.   Exercising: No.  The patient does not participate in regular exercise at present. Smoker:  no  Health Maintenance: Pap:  10/06/08 wnl  History of abnormal Pap:  no MMG:  06/18/16 BIRADS 1 negative  Colonoscopy:  4/14- repeat 5 years- Dr. [name]  BMD:   02/22/16 osteopenia  TDaP:  01/01/11  Pneumonia vaccine(s):  10/21/12, 02/12/14  Zostavax:   05/26/07  Hep C testing: not indicated  Screening Labs: PCP, Hb today: PCP, Urine today: has sample if needed    reports that she has never smoked. She has never used smokeless tobacco. She reports that she does not drink alcohol or use drugs.  Past Medical History:  Diagnosis Date  . 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 Aortic root aneurysm ([place])   . Breast cancer ([place]) 2002   infiltrative ductal carcinoma   . COPD (chronic obstructive pulmonary disease) ([place]) 9/08  . Ductal carcinoma of breast, estrogen receptor positive, stage 1 ([place]) 09/16/2012  . Endometriosis   . Hypercalcemia 09/14/2013  . Lesion of breast 1992   right, benign  . Lung disease    secondary to [name] infection  . Mastalgia 7/95  . Osteoporosis, post-menopausal 03/14/2012  . Ovarian cancer ([place]) 2004  . Pancreatitis    secondary to Cholelithiasis  . Parkinson disease ([facility]) 02/10/15  . Post-thoracotomy pain syndrome   . Pseudomonas  pneumonia ([place]) 5/09  . Uterine fibroid   . Vitamin D deficiency     Past Surgical History:  Procedure Laterality Date  . 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 APPENDECTOMY  2004  . BREAST BIOPSY  08/2004   right breast-benign  . BREAST LUMPECTOMY  1992   benign  . CARDIAC CATHETERIZATION  2007   essentially negation for significant CAD  . CHOLECYSTECTOMY  2001  . COLONOSCOPY  2014   Dr [name] , due 2019  . LUNG BIOPSY  2002   [name], Dr [name]  . MASTECTOMY MODIFIED RADICAL  2002   oral chemotheraphy (tamoxifen then Armidex) no radiation, Dr.[name]  . TONSILLECTOMY  1946    Current Outpatient Prescriptions  Medication Sig Dispense Refill  . acetaminophen (TYLENOL) 325 MG tablet Take 1,000 mg by mouth 2 (two) times daily as needed.     [name] aspirin 81 MG tablet Take 81 mg by mouth daily.      . carbidopa-levodopa ([name]) 25-100 MG tablet TAKE 2 TABLETS IN THE MORNING, 1 TABLET IN THE AFTERNOON, 1 IN THE EVENING 360 tablet 1  . clonazePAM (KLONOPIN) 0.5 MG tablet TAKE 0.5-1 TABLET BY MOUTH AT BEDTIME 90 tablet 1  . gabapentin ([name]) 300 MG capsule Take 2 capsules (600 mg total)  by mouth 3 (three) times daily. 450 capsule 3  . losartan (COZAAR) 50 MG tablet TAKE 1 TABLET EVERY DAY 90 tablet 3  . metoprolol tartrate (LOPRESSOR) 25 MG tablet Take 1 tablet (25 mg total) by mouth 2 (two) times daily. 180 tablet 2  . Vitamin D, Ergocalciferol, ([name]) 50000 units CAPS capsule TAKE 1 CAPSULE BY MOUTH EVERY WEEK 12 capsule 4   No current facility-administered medications for this visit.     Family History  Problem Relation Age of Onset  . Lung cancer Father 56       d.82 history of smoking  . Breast cancer Sister 26  . Colon cancer Sister   . Emphysema Mother        d.64 was never a smoker  . Stroke Paternal Aunt        in mid 9s  . Osteoporosis Paternal Aunt   . Melanoma Maternal Aunt   . Cancer Maternal Uncle         unspecified type  . Cancer Maternal Grandmother        d.82s unspecified GI cancer  . Cancer Maternal Grandfather        d.62s unspecified type  . Liver cancer Maternal Aunt   . Cancer Maternal Uncle        unspecified type  . Cancer Maternal Uncle        unspecified type  . Myasthenia gravis Paternal Aunt   . Breast cancer Cousin        d.60s-daughter of unaffected paternal aunt Eustace Moore  . Colon cancer Cousin 94       d.70-daughter of unaffected maternal aunt Leila  . Lung cancer Cousin 70       d.70-sisters to each other, both daughters of maternal uncle Johnny  . Diabetes Neg Hx   . Heart disease Neg Hx     ROS:  Pertinent items are noted in HPI.  Otherwise, a comprehensive ROS was negative.  Exam:   BP 134/78 (BP Location: Right Arm, Patient Position: Sitting, Cuff Size: Normal)   Pulse 70   Resp (!) 24   Ht 5' 2.5" (1.588 m)   Wt 148 lb (67.1 kg)   BMI 26.64 kg/m   Height: 5' 2.5" (158.8 cm)  Ht Readings from Last 3 Encounters:  09/16/17 5' 2.5" (1.588 m)  06/12/17 5\' 2"  (1.575 m)  03/28/17 5' 2.5" (1.588 m)    General appearance: alert, cooperative and appears stated age Head: Normocephalic, without obvious abnormality, atraumatic Neck: no adenopathy, supple, symmetrical, trachea midline and thyroid normal to inspection and palpation Lungs: clear to auscultation bilaterally Breasts: absent left breast with normal skin scars and no masses or LAD, right breast without any skin changes, masses, nipple discharge or LAD Heart: regular rate and rhythm Abdomen: soft, non-tender; bowel sounds normal; no masses,  no organomegaly Extremities: extremities normal, atraumatic, no cyanosis or edema Skin: Skin color, texture, turgor normal. No rashes or lesions Lymph nodes: Cervical, supraclavicular, and axillary nodes normal. No abnormal inguinal nodes palpated Neurologic: Grossly normal   Pelvic: External genitalia:  no lesions              Urethra:  normal appearing urethra  with no masses, tenderness or lesions              Bartholins and Skenes: normal                 Vagina: normal appearing vagina with normal color and discharge, no lesions  Cervix: absent              Pap taken: No. Bimanual Exam:  Uterus:  uterus absent              Adnexa: no mass, fullness, tenderness               Rectovaginal: Confirms               Anus:  normal sphincter tone, no lesions  Chaperone was present for exam.  A:  Well Woman with normal exam PMP, no HRT H/O borderline mucinous ovarian ca, stage 1C, H/O TAH/BSO 2004 H/O breast cancer s/p mastectomy 1/02 Osteoporosis.  Has been treated with Zometa. Low Vit D Elevated lipids H/O 3.9cm aortic root aneurysm. Most recently measured on 03/21/17 CT COPD.  Does not see pulmonologist any longer but still followed by allergy/immunology   P:   Mammogram guidelines reviewed.  Pt desires to continue MMG.  Will schedule MMG for pt. pap smear not indicated Will reach out to Dr. [name] to see if he will see pt so she does not have to drive further Pt will return for Ca-125 return annually or prn

## 2017-09-17 ENCOUNTER — Encounter: Payer: Self-pay | Admitting: Psychology

## 2017-09-17 ENCOUNTER — Ambulatory Visit (INDEPENDENT_AMBULATORY_CARE_PROVIDER_SITE_OTHER): Payer: Medicare Other | Admitting: Psychology

## 2017-09-17 DIAGNOSIS — R413 Other amnesia: Secondary | ICD-10-CM

## 2017-09-17 DIAGNOSIS — G20 Parkinson's disease: Secondary | ICD-10-CM | POA: Diagnosis not present

## 2017-09-19 ENCOUNTER — Other Ambulatory Visit: Payer: Self-pay | Admitting: *Deleted

## 2017-09-19 DIAGNOSIS — C569 Malignant neoplasm of unspecified ovary: Secondary | ICD-10-CM

## 2017-09-23 ENCOUNTER — Other Ambulatory Visit: Payer: Self-pay | Admitting: Family Medicine

## 2017-09-23 ENCOUNTER — Other Ambulatory Visit (INDEPENDENT_AMBULATORY_CARE_PROVIDER_SITE_OTHER): Payer: Medicare Other

## 2017-09-23 DIAGNOSIS — C569 Malignant neoplasm of unspecified ovary: Secondary | ICD-10-CM

## 2017-09-24 LAB — CA 125: Cancer Antigen (CA) 125: 32.6 U/mL (ref 0.0–38.1)

## 2017-09-26 ENCOUNTER — Telehealth: Payer: Self-pay | Admitting: *Deleted

## 2017-09-26 DIAGNOSIS — C569 Malignant neoplasm of unspecified ovary: Secondary | ICD-10-CM

## 2017-09-26 NOTE — Telephone Encounter (Signed)
Attempted to reach patient. Voicemail not set up. Unable to leave message.

## 2017-09-26 NOTE — Telephone Encounter (Signed)
-----   Message from Megan Salon, MD sent at 09/25/2017 10:11 AM EDT ----- Please notify pt her Ca-125 was normal.  Repeat 1 year.

## 2017-09-26 NOTE — Telephone Encounter (Signed)
Patient returned call. Results reviewed with patient as seen below. Patient voiced concern over the fact that the Ca-125 level had doubled in the last year. Patient states it is very concerning to her that the level is "inching up" even though the level is still considered "normal." Patient states her pain is in the area of the "original problem" and that has her concerned too. RN advised would review concerns with Dr. Sabra Heck and return call with any additional recommendations. Patient agreeable.   Routing to provider for review.

## 2017-10-07 ENCOUNTER — Ambulatory Visit: Payer: Medicare Other

## 2017-10-16 NOTE — Telephone Encounter (Signed)
Call to patient. Message given to patient as seen below from Dr. Sabra Heck. Patient verbalized understanding. Patient requests 4 month recheck. Lab visit scheduled for Monday 01/27/17 at 1000. Patient agreeable to date and time of appointment. Future order placed for Ca-125.   Patient agreeable to disposition. Will close encounter.

## 2017-10-16 NOTE — Telephone Encounter (Signed)
Please let pt know I communicated with gyn/oncology.  This level can bounce around.  It is still in the normal range so that is good.  I do understand her concerns.  Could recheck in 4 months and if continuing to rise or if above the "normal" range, would pursue additional evaluation.  If she is ok with this, put on lab scheduled for ca-125 in four months.  Order has not been placed.

## 2017-10-23 ENCOUNTER — Ambulatory Visit: Payer: Medicare Other | Admitting: Family Medicine

## 2017-10-23 ENCOUNTER — Encounter: Payer: Self-pay | Admitting: Family Medicine

## 2017-10-23 VITALS — BP 136/80 | HR 63 | Temp 97.9°F | Wt 147.0 lb

## 2017-10-23 DIAGNOSIS — J449 Chronic obstructive pulmonary disease, unspecified: Secondary | ICD-10-CM

## 2017-10-23 DIAGNOSIS — I719 Aortic aneurysm of unspecified site, without rupture: Secondary | ICD-10-CM | POA: Diagnosis not present

## 2017-10-23 DIAGNOSIS — G20 Parkinson's disease: Secondary | ICD-10-CM

## 2017-10-23 DIAGNOSIS — I10 Essential (primary) hypertension: Secondary | ICD-10-CM | POA: Diagnosis not present

## 2017-10-23 DIAGNOSIS — R911 Solitary pulmonary nodule: Secondary | ICD-10-CM | POA: Diagnosis not present

## 2017-10-23 DIAGNOSIS — R5382 Chronic fatigue, unspecified: Secondary | ICD-10-CM | POA: Insufficient documentation

## 2017-10-23 DIAGNOSIS — M810 Age-related osteoporosis without current pathological fracture: Secondary | ICD-10-CM

## 2017-10-23 LAB — VITAMIN B12: Vitamin B-12: 216 pg/mL (ref 211–911)

## 2017-10-23 LAB — VITAMIN D 25 HYDROXY (VIT D DEFICIENCY, FRACTURES): VITD: 50.47 ng/mL (ref 30.00–100.00)

## 2017-10-23 LAB — TSH: TSH: 1.52 u[IU]/mL (ref 0.35–4.50)

## 2017-10-23 NOTE — Assessment & Plan Note (Addendum)
Followed by cancer center. She is taking vit D 50,000 iu weekly. She was previously on zometa. Planned rpt DEXA sometime next year. Encouraged regular weight bearing exercise.

## 2017-10-23 NOTE — Progress Notes (Signed)
BP 136/80 (BP Location: Left Arm, Patient Position: Sitting, Cuff Size: Normal)   Pulse 63   Temp 97.9 F (36.6 C) (Oral)   Wt 147 lb (66.7 kg)   SpO2 97%   BMI 26.46 kg/m    CC: transfer from Dr Deborra Medina Subjective:    Patient ID: Patsey Berthold, female    DOB: [birth year], 76 y.o.   MRN: [id number]  HPI: CYNITHIA HAKIMI is a 75 y.o. female presenting on 10/23/2017 for Transfer pt (from Dr. Deborra Medina. States she uses Engineer, site but not sure of strengths. Will call later with info) and Fatigue (Started 4-5 mos ago after having bronchitis and pneumonia)   Pleasant patient new to me with h/o parkinson disease followed by Dr Tat, diagnosed 2015 on sinemet since then. Upcoming appt Friday. She also takes klonopin for REM sleep disorder (over last 2 years) and gabapentin for residual neuropathic pain anterior chest after open lung biopsy and mastectomy (for last 10+ yrs). Recent reassuringly normal neuropsychological test. She sees Dr Edwinna Areola for well woman exam. H/o borderline mucinous ovarian cancer s/p TAH/BSO 2004. H/o breast cancer s/p mastectomy 2002.   Osteoporosis - previously on zometa. She has low vit D levels treated with 50,000 units vit D weekly. Upcoming DEXA 2019. This is followed by Dr Payton Mccallum.  H/o 3.9cm aortic root aneurysm latest CT 02/2017.   Intermittent R flank noticeable discomfort, has noticed more recently. Denies inciting trauma/injury.   Increased fatigue in the mornings, better after 2pm. Endorses restorative sleep. Doesn't think she snores. Some daytime somnolence. This all started after spring 2018 bad case of bronchitis ?PNA. Known COPD on flovent and proair - second hand smoke exposure. Known [name]. She sees Dr Lynford Citizen.   Relevant past medical, surgical, family and social history reviewed and updated as indicated. Interim medical history since our last visit reviewed. Allergies and medications reviewed and updated. Outpatient Medications Prior to  Visit  Medication Sig Dispense Refill  . acetaminophen (TYLENOL) 325 MG tablet Take 1,000 mg by mouth 2 (two) times daily as needed.     . Albuterol Sulfate ([name]) 983 (90 Base) MCG/ACT AEPB Inhale 2 puffs into the lungs every 6 (six) hours as needed (dyspnea, wheezing).    [name] aspirin 81 MG tablet Take 81 mg by mouth daily.      . carbidopa-levodopa ([name]) 25-100 MG tablet TAKE 2 TABLETS IN THE MORNING, 1 TABLET IN THE AFTERNOON, 1 IN THE EVENING 360 tablet 1  . clonazePAM (KLONOPIN) 0.5 MG tablet TAKE 0.5-1 TABLET BY MOUTH AT BEDTIME 90 tablet 1  . gabapentin ([name]) 300 MG capsule Take 2 capsules (600 mg total) by mouth 3 (three) times daily. 450 capsule 3  . losartan (COZAAR) 50 MG tablet TAKE 1 TABLET EVERY DAY 90 tablet 3  . metoprolol tartrate (LOPRESSOR) 25 MG tablet TAKE 1 TABLET (25 MG TOTAL) BY MOUTH 2 (TWO) TIMES DAILY. 180 tablet 0  . Vitamin D, Ergocalciferol, ([name]) 50000 units CAPS capsule TAKE 1 CAPSULE BY MOUTH EVERY WEEK 12 capsule 4   No facility-administered medications prior to visit.      Per HPI unless specifically indicated in ROS section below Review of Systems     Objective:    BP 136/80 (BP Location: Left Arm, Patient Position: Sitting, Cuff Size: Normal)   Pulse 63   Temp 97.9 F (36.6 C) (Oral)   Wt 147 lb (66.7 kg)   SpO2 97%   BMI 26.46 kg/m  Wt Readings from Last 3 Encounters:  10/23/17 147 lb (66.7 kg)  09/16/17 148 lb (67.1 kg)  06/12/17 152 lb (68.9 kg)    Physical Exam  Constitutional: She appears well-developed and well-nourished. No distress.  HENT:  Mouth/Throat: Oropharynx is clear and moist. No oropharyngeal exudate.  Eyes: Conjunctivae and EOM are normal. Pupils are equal, round, and reactive to light.  Neck: Normal range of motion. Neck supple. No thyromegaly present.  Cardiovascular: Normal rate, regular rhythm, normal heart sounds and intact distal pulses.  No murmur heard. Pulmonary/Chest: Effort normal.  No respiratory distress. She has wheezes (mild expiratory). She has no rales.  Coarse   Musculoskeletal: She exhibits no edema.  Psychiatric: She has a normal mood and affect.  Nursing note and vitals reviewed.  Results for orders placed or performed in visit on 09/23/17  CA 125  Result Value Ref Range   Cancer Antigen (CA) 125 32.6 0.0 - 38.1 U/mL   Lab Results  Component Value Date   CREATININE 0.8 03/28/2017    CT Chest Wo Contrast CLINICAL DATA:  Follow-up lung nodule  EXAM: CT CHEST WITHOUT CONTRAST  TECHNIQUE: Multidetector CT imaging of the chest was performed following the standard protocol without IV contrast.  COMPARISON:  03/13/17, 02/23/2015  FINDINGS: Cardiovascular: Somewhat limited due to lack of IV contrast. Mild aortic calcifications are seen with dilatation of the ascending aorta to 3.9 cm stable from the previous exam. No significant cardiac enlargement is noted. Mild coronary calcifications are seen.  Mediastinum/Nodes: The thoracic inlet is within normal limits. No significant hilar or mediastinal adenopathy is identified. The esophagus as visualize is within normal limits.  Lungs/Pleura: The lungs are well aerated bilaterally but demonstrate chronic scarring particularly in the right upper lobe related to an area of previous surgical biopsy. Few scattered calcified granulomas are noted on the right. A somewhat spiculated density is again seen in the right lower lobe measuring approximately 12 x 7 mm. This is roughly stable from the previous exam from 2016 as well as a exam from 2015 consistent with chronic atypical mycobacterial infection. Stable nodular areas are noted within the left lung similar to that seen on the prior exam. There is a new vague ground-glass nodular density identified in the left lower lobe best seen on image number 76 of series 4. It measures approximately 10 mm in greatest dimension and was not well appreciated on the prior  exam from 2016. An area of likely chronic scarring is noted in the posteromedial left lower lobe.  Upper Abdomen: The liver demonstrates some vague areas of decreased attenuation identified. These are limited due to the lack of IV contrast. The largest of these is seen on image number 120 of series 3 measuring 16 mm and increased from 10 mm on the prior exam. Further evaluation is recommended. Nonobstructing bilateral renal calculi are noted as well.  Musculoskeletal: Changes consistent with prior left mastectomy. Degenerative changes of the thoracic spine are noted. Old healed rib fractures are noted on the left.  IMPRESSION: Chronic changes in the lungs bilaterally similar to that seen on the prior exam and likely related to chronic indolent infection as described.  New area of ground-glass attenuation in the left lower lobe laterally. This is also likely postinflammatory in nature although given its size, Initial follow-up with CT at 6-12 months is recommended to confirm persistence. If persistent, repeat CT is recommended every 2 years until 5 years of stability has been established. This recommendation follows the consensus  statement: Guidelines for Management of Incidental Pulmonary Nodules Detected on CT Images: From the Fleischner Society 2017; Radiology 2017; 284:228-243.  Hypodensity within the right lobe of the liver increased in size when compared with the prior examination from 2016. On previous contrast-enhanced study this was hyperdense and likely represents a small hemangioma. Given the size increase however, ultrasound follow-up is recommended.  Stable dilatation of the ascending aorta 3.9 cm.  Electronically Signed   By: Inez Catalina M.D.   On: 03/21/2017 14:38      Assessment & Plan:   Problem List Items Addressed This Visit    Aortic aneurysm [facility])    H/o this. Update chest CT as well.       Relevant Orders   CT Chest Wo Contrast   Chronic  fatigue - Primary    Chronic since lung infection spring 2018. Not consistent with OSA related. Reviewed recent labs. Check for other reversible causes of fatigue with labwork today. Endorses worse fatigue first thing in am. I suggested she try lower gabapentin dose in am (300mg ) to see if medication related.       Relevant Orders   Vitamin B12   VITAMIN D 25 Hydroxy (Vit-D Deficiency, Fractures)   TSH   Chronic obstructive airway disease with asthma (HCC)    Followed by pulm Dr Lynford Citizen. Endorses h/o indolent [name] infection.       Relevant Medications   Albuterol Sulfate ([name]) 683 (90 Base) MCG/ACT AEPB   Essential hypertension    Chronic, stable. Continue current regimen.       Lesion of left lung    Reviewed latest CT results - CT done after possible PNA spring 2018. Due for updated CT - will order today.       Relevant Orders   CT Chest Wo Contrast   Osteoporosis, post-menopausal    Followed by cancer center. She is taking vit D 50,000 iu weekly. She was previously on zometa. Planned rpt DEXA sometime next year. Encouraged regular weight bearing exercise.       Parkinson disease [facility])    Appreciate neuro care - on sinemet.        Other Visit Diagnoses    Solitary pulmonary nodule       Relevant Orders   CT Chest Wo Contrast       Follow up plan: Return in about 3 months (around 01/23/2018) for annual exam, prior fasting for blood work, medicare wellness visit.  [name], MD

## 2017-10-23 NOTE — Assessment & Plan Note (Signed)
Followed by pulm Dr Lynford Citizen. Endorses h/o indolent [name] infection.

## 2017-10-23 NOTE — Assessment & Plan Note (Signed)
Chronic since lung infection spring 2018. Not consistent with OSA related. Reviewed recent labs. Check for other reversible causes of fatigue with labwork today. Endorses worse fatigue first thing in am. I suggested she try lower gabapentin dose in am (300mg ) to see if medication related.

## 2017-10-23 NOTE — Patient Instructions (Addendum)
Good to meet you today! Let's check labs today for reversible causes of fatigue.  Return as needed or after February 5th for medicare wellness visit with [name] and physical with me. We will order updated CT of chest as well.

## 2017-10-23 NOTE — Assessment & Plan Note (Signed)
H/o this. Update chest CT as well.

## 2017-10-23 NOTE — Assessment & Plan Note (Signed)
Chronic, stable. Continue current regimen. 

## 2017-10-23 NOTE — Assessment & Plan Note (Signed)
Appreciate neuro care - on sinemet.

## 2017-10-23 NOTE — Assessment & Plan Note (Signed)
Reviewed latest CT results - CT done after possible PNA spring 2018. Due for updated CT - will order today.

## 2017-10-24 NOTE — Progress Notes (Signed)
Crystal Bass was seen today in the movement disorders clinic for neurologic consultation at the request of [name], MD.  The consultation is for the evaluation of tremor.  Pt states that it has been going on over a year.  It is mostly in the R hand and rarely in the L hand.  "It is almost constant but not quite."  She notices it when grasping the steering wheel.  Eating was troublesome but better now that d/c the symbicort.  Symbicort worsened tremor by 80%.  Paternal aunt with tremor.  05/17/15 update:  Last visit, I diagnosed the patient with Parkinson's disease and started her on carbidopa/levodopa 25/100, one tablet 3 times per day.  She states that for the first 6 weeks she had diarrhea but it got better and it went away.  She does think that the tremor is better but does know that stress will trigger the tremor.  She also states that speech therapy seems to set off tremor.  She was started in physical therapy at [facility].  She has noted that she is waking up from dreams yelling and acting out dreams.  She does have a long history of migraine headaches and last visit had wanted me to prescribe Imitrex.  I was very nervous that her age and did not feel that she was likely a triptan candidate.  I ended up ordering an MRI of the brain on 02/28/2015.  I reviewed this and there was evidence of moderate small vessel disease.  I subsequently told her that she was not a candidate for the triptans.  She also had an MRI of the cervical spine on a performed, 2016.  There was mild multilevel cervical spondylosis.  Unfortunately, she has had several headaches since last visit that were fairly severe.  She did see Dr. [name] since last visit in regards to dysphagia and she states that it was scheduled but she ended up having to reschedule it.  08/23/15 update:  The patient is following up today.  She remains on carbidopa/levodopa 25/100, one tablet 3 times per day (8:30am/2pm/10PM).  She does have some  diarrhea that she associates with taking the pill but feels it is better than when she first started taking it.  She also states that it takes her until mid afternoon to feel normal and just feels slow in the morning.  She was started on clonazepam last visit for REM behavior disorder and that has helped dramatically.  She has had some spells of weakness in which her legs give out.  It may happen a few times a week.  She does not relate that to the addition of clonazepam at all.  No falls.  No hallucinations.  She was exercising until her back started hurting again.   In regards to her migraines, I gave her a prescription for Cambia last visit.  She is not a candidate for the triptans any longer.  She picked it up but hasn't had to use it yet.  She is on neurontin 600 mg bid for post surgical pain (states that she had mastectomy and open lung biopsy and then had terrible paresthesias).  Is still like that if doesn't take the neurontin, especially if she has a bra on.  She does complain that she thinks she is not thinking as clear as she used to.  11/28/14 update:  The patient is following up today.  Last visit, I increased her carbidopa/levodopa so that she is taking carbidopa/levodopa 25/100, 2  tablets in the morning, one in the afternoon and one in the evening.  I was hoping that this would help her morning "on."  Today, the patient states that she just has no energy and she is unsure if it is related to the medication; it started before the medication increase.  She thinks that the energy does get a bit better throughout the day.  She is on the klonopin and "I don't want to give that up as I don't dream anymore."  She has so little energy that she has not exercised much.  Does want to go back to PT at [facility].  She is not snoring at night that she knows of.  She's had no falls since last visit.  No hallucinations.  No lightheadedness or near syncope.  She has a has a history of rare migraine.  She does have  Cambia at home but has not had to use that.  She had a "bad" headache last night but got rid of it with tylenol, aleve, and coke.  Also c/o issues with BM's - both constipation and diarrhea.  03/01/16 update: The patient is following up today regarding her Parkinson's disease.  I have had the opportunity to review her prior records made available to me.  I last visit, she was complaining about loss of energy.  It was not sure that this was really related to her medications, but we decided to strategically hold medications.  She held her levodopa for about 4 days, because she thought that that was the culprit.  She felt no different in terms of energy off of the medication and ended up going back on the carbidopa/levodopa 25/100, but for some reason she went from 2 in the morning, one in the afternoon and one in the evening to 1 tablet three times a day (states that she didn't think that the higher dose made a difference).  We subsequently held her clonazepam and her energy may have been a little bit better, but she felt that it was certainly not worth being off of the medication, because she has terrible dreams and acting out the dreams off of the medications.  We decided subsequently to that selegiline for its amphetamine-like properties, but she decided not to take the medication after it was prescribed.  She reported that she wanted to try to increase her energy through exercise.  She is doing the BIG/LOUD exercises at home and walks some.  Brings in a [facility] form for exercise today.  No hallucination but rare visual distortion.  She has had some headaches but hasn't tried the cambia.    06/01/16 update:  The patient is following up today.  Last visit, I encouraged the patient to increase her levodopa back to 2 tablets in the morning, one in the afternoon and one evening, because every time she decreased it she became more rigid.  She states that she did that and she is doing "okay."  She also has a history  of REM behavior disorder and is on half a tablet of clonazepam at nighttime.  She denies falls since last visit.  No hallucinations.  May have occasional lightheadedness but no near syncope.  Thinks that she has "muscle weakness" but points to the neck and states that it is painful and then will feel like the head will tumble off.  Asks me about "posture PT" at [facility].    10/02/16 update:  Pt f/u today.  Pt is on carbidopa/levodopa 25/100, 2 in  the AM, 1 in the afternoon and 1 in the evening.  Still on klonopin 0.5 mg, 1/2 tablet q hs.  No falls since our last visit.  No hallucinations.  No increased lightheadedness.  No syncope.  Mood has been fair.  Doing at least 1.25 miles per day on her [place] for exercise.  02/05/17 update:  Patient follows up today.  She is on carbidopa/levodopa 25/100, 2 tablets in the morning, one in the afternoon and one in the evening.  Reports that she has been doing well since our last visit.  Minimal tremor.  No falls.  No lightheadedness or near syncope.  She takes clonazepam, 0.5 mg, half tablet at night and does well with this.  She sleeps well.  No acting out of the dreams.  When asked about exercise, she states that "I was doing well until winter hit."  In regards to headache, she was having more than usual and was taking 4 tylenol per day but she stopped doing that.    06/12/17 update:  Patient seen in follow-up regarding her Parkinson's disease.  She remains on carbidopa/levodopa 25/100, 2 tablets in the morning, one in the afternoon and one in the evening.  She is also on clonazepam, 0.5 mg, half tablet at night.  She has had a few occasions of yelling out in the night.  Pt denies falls.  Pt has occasional lightheadedness and she will need to sit.  Its worse after she sits.    No hallucinations.  Mood has been good.  States that she is most concerned about cognition.  She will forget what she is saying in the middle of a sentence.  She is having some trouble with  forgetting names.  She puts her pills on the counter so she can see them and therefore she remembers them.  She remembers to pay bills.  She has no trouble driving.    10/25/17 update: Patient is seen in follow-up today regarding her Parkinson's disease.  She is on carbidopa/levodopa 25/100, 2/1/1.  Pt denies falls but she states that she has been very careful.  She will occasionally feel like her legs give way.  She is not exercising because she feels weak from the time she gets up until about 1-2pm.    Pt denies lightheadedness, near syncope.  No hallucinations.  Mood has been good.  She remains on clonazepam 0.5 mg, half a tablet at night for REM behavior disorder.  She has not had any acting out of the dreams or falling out of bed.  She has had one episode of screaming in the middle of the night.  She is having a problem with sleep.  they moved her at [facility] from her apartment to a new apartment.  She is close to industrial dryers that go all night and interrupt her sleep. The records that were made available to me were reviewed.  She saw Dr. Si Raider and had neurocognitive testing on August 15, 2017.  She has reviewed the results with Dr. Si Raider.  There is no evidence of cognitive impairment.  There is no evidence of a psychiatric disorder.     PREVIOUS MEDICATIONS: none to date  ALLERGIES:   Allergies  Allergen Reactions  . Sulfonamide Derivatives Anaphylaxis  . Topamax [Topiramate] Other (See Comments)    Metabolic acidosis   . Biaxin [Clarithromycin] Other (See Comments)    pericarditis  . Hydrocodone Other (See Comments)    "hyper and climbing the walls"  . Motrin [Ibuprofen]  Other (See Comments)    headaches  . Symbicort [Budesonide-Formoterol Fumarate]     02/07/15 tremor  . Percocet [Oxycodone-Acetaminophen] Itching and Rash  . Tape Itching and Rash    Use paper tape only  . Tetracyclines & Related Other (See Comments)    "immediate yeast infection"    CURRENT MEDICATIONS:   Outpatient Encounter Medications as of 10/25/2017  Medication Sig  . acetaminophen (TYLENOL) 325 MG tablet Take 1,000 mg by mouth 2 (two) times daily as needed.   . Albuterol Sulfate ([name]) 607 (90 Base) MCG/ACT AEPB Inhale 2 puffs into the lungs every 6 (six) hours as needed (dyspnea, wheezing).  [name] aspirin 81 MG tablet Take 81 mg by mouth daily.    . carbidopa-levodopa ([name]) 25-100 MG tablet TAKE 2 TABLETS IN THE MORNING, 1 TABLET IN THE AFTERNOON, 1 IN THE EVENING  . clonazePAM (KLONOPIN) 0.5 MG tablet TAKE 0.5-1 TABLET BY MOUTH AT BEDTIME  . gabapentin ([name]) 300 MG capsule Take 2 capsules (600 mg total) by mouth 3 (three) times daily.  [name] losartan (COZAAR) 50 MG tablet TAKE 1 TABLET EVERY DAY  . metoprolol tartrate (LOPRESSOR) 25 MG tablet TAKE 1 TABLET (25 MG TOTAL) BY MOUTH 2 (TWO) TIMES DAILY.  [name] Vitamin D, Ergocalciferol, ([name]) 50000 units CAPS capsule TAKE 1 CAPSULE BY MOUTH EVERY WEEK   No facility-administered encounter medications on file as of 10/25/2017.     PAST MEDICAL HISTORY:   Past Medical History:  Diagnosis Date  . 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 Aortic root aneurysm ([place])   . Breast cancer ([place]) 2002   infiltrative ductal carcinoma   . COPD (chronic obstructive pulmonary disease) ([place]) 9/08  . Ductal carcinoma of breast, estrogen receptor positive, stage 1 ([place]) 09/16/2012  . Endometriosis   . Hypercalcemia 09/14/2013  . Lesion of breast 1992   right, benign  . Lung disease    secondary to [name] infection  . Mastalgia 7/95  . Osteoporosis, post-menopausal 03/14/2012  . Ovarian cancer ([place]) 2004  . Pancreatitis    secondary to Cholelithiasis  . Parkinson disease ([place]) 02/10/15  . Post-thoracotomy pain syndrome   . Pseudomonas pneumonia ([place]) 5/09  . Uterine fibroid   . Vitamin D deficiency     PAST SURGICAL HISTORY:   Past Surgical History:  Procedure Laterality Date  . ABDOMINAL  HYSTERECTOMY     & BSO for Mucinous borderline tumor of R ovary 2004  . APPENDECTOMY  2004  . BREAST BIOPSY  08/2004   right breast-benign  . BREAST LUMPECTOMY  1992   benign  . CARDIAC CATHETERIZATION  2007   essentially negation for significant CAD  . CHOLECYSTECTOMY  2001  . COLONOSCOPY  2014   Dr [name] , due 2019  . LUNG BIOPSY  2002   [name], Dr [name]  . MASTECTOMY MODIFIED RADICAL  2002   oral chemotheraphy (tamoxifen then Armidex) no radiation, Dr.[name]  . TONSILLECTOMY  1946    SOCIAL HISTORY:   Social History   Socioeconomic History  . Marital status: Widowed    Spouse name: Not on file  . Number of children: 0  . Years of education: Not on file  . Highest education level: Not on file  Social Needs  . Financial resource strain: Not on file  . Food insecurity - worry: Not on file  . Food insecurity - inability: Not on file  . Transportation needs -  medical: Not on file  . Transportation needs - non-medical: Not on file  Occupational History  . 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 Smoking status: Never Smoker  . Smokeless tobacco: Never Used  Substance and Sexual Activity  . Alcohol use: No    Alcohol/week: 0.0 oz  . Drug use: No  . Sexual activity: No    Birth control/protection: Surgical    Comment: TAH/BSO  Other Topics Concern  . 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 Father  Deceased       lung cancer  . Sister  Alive       colon/breast cancer  . Mother  Deceased       emphysema  . Brother  Alive       healthy  . Ethlyn Daniels  (Not Specified)  . Ethlyn Daniels  (Not Specified)  . [place] Deceased  . [place] Deceased  . MGM  Deceased  . MGF  Deceased  . PGM  Deceased  . PGF  Deceased  . [place] Deceased  . [place] Deceased  . [place] Deceased  . Ethlyn Daniels  (Not Specified)  . [place] Deceased  . Cousin  Deceased  . Cousin Jinny Sanders and [place] Deceased  . Neg Hx  (Not Specified)    ROS:  A complete 10 system review of systems was obtained and was unremarkable apart from what is mentioned above.  PHYSICAL EXAMINATION:    VITALS:   Vitals:   10/25/17 1106  BP: 110/70  Pulse: 62  SpO2: 96%  Weight: 149 lb (67.6 kg)  Height: 5' 2.5" (1.588 m)    GEN:  The patient appears stated age and is in NAD. HEENT:  Normocephalic, atraumatic.  The mucous membranes are moist. The superficial temporal arteries are without ropiness or tenderness. CV:  RRR Lungs:  CTAB Neck/HEME:  There are no carotid bruits bilaterally.  Neurological examination:  Orientation:  Montreal Cognitive Assessment  10/02/2016  Visuospatial/ Executive (0/5) 4  Naming (0/3) 3  Attention: Read list of digits (0/2) 2  Attention: Read list of letters (0/1) 1  Attention: Serial 7 subtraction starting at 100 (0/3) 3  Language: Repeat phrase (0/2) 2  Language : Fluency (0/1) 1  Abstraction (0/2) 2  Delayed Recall (0/5) 3  Orientation (0/6) 6  Total 27  Adjusted Score (based on education) 27   Cranial nerves: There is good facial symmetry with the exception of the fact that when asked to raise the eyebrows, the right eyebrow raises further than the left.  There is mild facial hypomimia. The speech is fluent and clear.  She is slightly hypophonic.  Soft palate rises symmetrically and there is no tongue deviation. Hearing is intact to conversational tone. Sensation: Sensation is intact to light touch throughout. Motor: Strength is at least antigravity x4.   Movement examination: Tone: There is minimal increased tone in the LUE Abnormal movements: There is rare tremor on the L Coordination:  There is mild trouble with  alternation of supination/pronation of the forearm bilateral but otherwise all RAMs are good. Gait and Station: The patient  has no difficulty arising out of a deep-seated chair without the use of the hands. She has minor pisa syndrom to  the right.  The patient's stride length is normal, and good arm swing  LABS  Lab Results  Component Value Date   WBC 6.7 03/28/2017   HGB 13.7 03/28/2017   HCT 41.4 03/28/2017   MCV 95.1 03/28/2017   PLT 210 03/28/2017     Chemistry      Component Value Date/Time   NA 141 03/28/2017 1130   K 4.2 03/28/2017 1130   CL 101 12/27/2016 1426   CL 102 03/10/2013 1529   CO2 28 03/28/2017 1130   BUN 17.3 03/28/2017 1130   CREATININE 0.8 03/28/2017 1130      Component Value Date/Time   CALCIUM 9.6 03/28/2017 1130   ALKPHOS 85 03/28/2017 1130   AST 12 03/28/2017 1130   ALT <6 03/28/2017 1130   BILITOT 1.38 (H) 03/28/2017 1130       ASSESSMENT/PLAN:  1.   idiopathic Parkinson's disease.  The patient has tremor, bradykinesia, rigidity and mild postural instability.  Sx's have been present for at least since March, 2015 by the patients account today.  She is Hoehn and Yahr stage 2-2.5.    -She will continue on carbidopa/levodopa 25/100, 2 tablets in the morning, 1 in the afternoon and one evening.  -we will refer to [facility] at [facility]  -letter written to [facility] about living situation and given to her today 2.  Mild dysphagia  -The patient states that this is no longer a problem 3.  Long hx migraine  -No longer a candidate for triptan therapy.  This is due to cerebral small vessel disease.  She does have Cambia for when necessary use.  Headaches have been better lately 5.  Mild orthostasis  -better now that not on 2 different BP meds.  Will continue to monitor    6.  Neck pain/hyperreflexia  -MRI cervical spine in April, 2016 just demonstrated mild multilevel cervical spondylosis. 7.  REM behavior disorder  -Doing well with low-dose clonazepam, 0.5 mg, half tablet at night.  Risks, benefits, side effects and alternative therapies were discussed.  The opportunity to ask  questions was given and they were answered to the best of my ability.  The patient expressed understanding and willingness to follow the outlined treatment protocols. 8.  Mild cognitive impairment  -She saw Dr. Si Raider and had neurocognitive testing on August 15, 2017.  She has reviewed the results with Dr. Si Raider.  There is no evidence of cognitive impairment, including dementia.  There is no evidence of a psychiatric disorder. 9.  Follow up is anticipated in the next few months, sooner should new neurologic issues arise.  Much greater than 50% of this visit was spent in counseling and coordinating care.  Total face to face time:  25 min

## 2017-10-25 ENCOUNTER — Encounter: Payer: Self-pay | Admitting: Neurology

## 2017-10-25 ENCOUNTER — Ambulatory Visit: Payer: Medicare Other | Admitting: Neurology

## 2017-10-25 VITALS — BP 110/70 | HR 62 | Ht 62.5 in | Wt 149.0 lb

## 2017-10-25 DIAGNOSIS — G4752 REM sleep behavior disorder: Secondary | ICD-10-CM | POA: Diagnosis not present

## 2017-10-25 DIAGNOSIS — G20 Parkinson's disease: Secondary | ICD-10-CM | POA: Diagnosis not present

## 2017-10-25 NOTE — Addendum Note (Signed)
Addended byAnnamaria Helling on: 10/25/2017 12:37 PM   Modules accepted: Orders

## 2017-10-27 ENCOUNTER — Other Ambulatory Visit: Payer: Self-pay | Admitting: Family Medicine

## 2017-10-27 MED ORDER — VITAMIN B-12 1000 MCG PO TABS: 1000.0000 ug | ORAL_TABLET | Freq: Every day | ORAL | Status: DC

## 2017-11-01 ENCOUNTER — Ambulatory Visit
Admission: RE | Admit: 2017-11-01 | Discharge: 2017-11-01 | Disposition: A | Discharge status: Home / Self Care | Payer: Medicare Other | Source: Ambulatory Visit | Attending: Family Medicine | Admitting: Family Medicine

## 2017-11-01 ENCOUNTER — Other Ambulatory Visit: Payer: Self-pay | Admitting: Family Medicine

## 2017-11-01 ENCOUNTER — Ambulatory Visit
Admission: RE | Admit: 2017-11-01 | Discharge: 2017-11-01 | Disposition: A | Discharge status: Home / Self Care | Payer: Medicare Other | Source: Ambulatory Visit | Attending: Obstetrics & Gynecology | Admitting: Obstetrics & Gynecology

## 2017-11-01 DIAGNOSIS — R911 Solitary pulmonary nodule: Secondary | ICD-10-CM

## 2017-11-01 DIAGNOSIS — Z1231 Encounter for screening mammogram for malignant neoplasm of breast: Secondary | ICD-10-CM

## 2017-11-01 DIAGNOSIS — I719 Aortic aneurysm of unspecified site, without rupture: Secondary | ICD-10-CM

## 2017-11-05 LAB — HM MAMMOGRAPHY

## 2017-11-07 ENCOUNTER — Other Ambulatory Visit: Payer: Self-pay | Admitting: Neurology

## 2017-11-07 ENCOUNTER — Encounter: Payer: Self-pay | Admitting: Family Medicine

## 2017-11-26 DIAGNOSIS — H269 Unspecified cataract: Secondary | ICD-10-CM

## 2017-11-26 HISTORY — DX: Unspecified cataract: H26.9

## 2017-12-25 ENCOUNTER — Other Ambulatory Visit: Payer: Self-pay

## 2017-12-25 ENCOUNTER — Encounter: Payer: Self-pay | Admitting: Physical Therapy

## 2017-12-25 ENCOUNTER — Ambulatory Visit: Payer: Medicare Other | Attending: Neurology | Admitting: Physical Therapy

## 2017-12-25 DIAGNOSIS — R262 Difficulty in walking, not elsewhere classified: Secondary | ICD-10-CM

## 2017-12-25 DIAGNOSIS — G20 Parkinson's disease: Secondary | ICD-10-CM | POA: Diagnosis not present

## 2017-12-25 DIAGNOSIS — R278 Other lack of coordination: Secondary | ICD-10-CM | POA: Diagnosis present

## 2017-12-25 DIAGNOSIS — M6281 Muscle weakness (generalized): Secondary | ICD-10-CM

## 2017-12-25 DIAGNOSIS — G20A1 Parkinson's disease without dyskinesia, without mention of fluctuations: Secondary | ICD-10-CM

## 2017-12-25 NOTE — Therapy (Signed)
[place] MAIN [facility] SERVICES [place], [place], [address] Phone: [phone]   Fax:  [phone]  Physical Therapy Evaluation  Patient Details  Name: Crystal Bass MRN: [id number] Date of Birth: 14-Sep-1941 Referring Provider: Ludwig Clarks    Encounter Date: 12/25/2017  PT End of Session - 12/25/17 1422    Visit Number  1    Number of Visits  17    Date for PT Re-Evaluation  01/29/18    PT Start Time  0200    PT Stop Time  0300    PT Time Calculation (min)  60 min    Equipment Utilized During Treatment  Gait belt    Activity Tolerance  Patient tolerated treatment well;Patient limited by fatigue    Behavior During Therapy  [facility] for tasks assessed/performed       Past Medical History:  Diagnosis Date  . 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 Aortic root aneurysm ([place])   . Breast cancer ([place]) 2002   infiltrative ductal carcinoma   . COPD (chronic obstructive pulmonary disease) ([place]) 9/08  . Ductal carcinoma of breast, estrogen receptor positive, stage 1 ([place]) 09/16/2012  . Endometriosis   . Hypercalcemia 09/14/2013  . Lesion of breast 1992   right, benign  . Lung disease    secondary to [name] infection  . Mastalgia 7/95  . Osteoporosis, post-menopausal 03/14/2012  . Ovarian cancer ([place]) 2004  . Pancreatitis    secondary to Cholelithiasis  . Parkinson disease ([place]) 02/10/15  . Post-thoracotomy pain syndrome   . Pseudomonas pneumonia ([place]) 5/09  . Uterine fibroid   . Vitamin D deficiency     Past Surgical History:  Procedure Laterality Date  . 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 APPENDECTOMY  2004  . BREAST BIOPSY  08/2004   right breast-benign  . BREAST LUMPECTOMY  1992   benign  . CARDIAC CATHETERIZATION  2007   essentially negation for significant CAD  . CHOLECYSTECTOMY  2001  . COLONOSCOPY  2014   Dr [name] , due 2019  . LUNG  BIOPSY  2002   [name], Dr [name]  . MASTECTOMY MODIFIED RADICAL Left 2002   oral chemotheraphy (tamoxifen then Armidex) no radiation, Dr.[name]  . TONSILLECTOMY  1946    There were no vitals filed for this visit.   Subjective Assessment - 12/25/17 1417    Subjective  Patient is having difficulty with walking intermediate distances and long distances. She is having unsteadness and balance deficits.     Limitations  Standing;Walking;Writing    Currently in Pain?  No/denies    Pain Score  0-No pain    Pain Onset  Today    Multiple Pain Sites  No         [facility] PT Assessment - 12/25/17 1420      Assessment   Medical Diagnosis  parkinsons march 2016    Referring Provider  TAT, REBECCA S     Onset Date/Surgical Date  02/10/15    Hand Dominance  Right    Next MD Visit  april 2019    Prior Therapy  no      Precautions   Precautions  None      Restrictions   Weight Bearing Restrictions  No      Balance Screen   Has the patient fallen in the past  6 months  No    Has the patient had a decrease in activity level because of a fear of falling?   Yes    Is the patient reluctant to leave their home because of a fear of falling?   No      Home Environment   Living Environment  -- independent living apartment    Living Arrangements  Alone    Available Help at Discharge  Available PRN/intermittently    Type of [place]  One level    [place] seat      Prior Function   Level of Independence  Independent with basic ADLs    Vocation  Retired    Leisure  movies, friends      Cognition   Overall Cognitive Status  Within Functional Limits for tasks assessed       POSTURE:  Rounded shoulders    PROM/AROM:WNL BUE and BLE  STRENGTH:  Graded on a 0-5 scale Muscle Group Left Right                          Hip Flex 4/5 4/5  Hip Abd 4/5 4/5  Hip Add    Hip Ext 3/5 3/5  Hip IR/ER    Knee Flex 5/5 5/5  Knee Ext 5/5 5/5  Ankle DF 5/5  5/5  Ankle PF 5/5 5/5   SENSATION: bilateral elbow pressure produces numbness tingling to her hands that is intermittent   FUNCTIONAL MOBILITY: difficulty with rolling left and right and slow supine to sit mobiity  BALANCE: Static Standing Balance  Normal Able to maintain standing balance against maximal resistance   Good Able to maintain standing balance against moderate resistance   Good-/Fair+ Able to maintain standing balance against minimal resistance x  Fair Able to stand unsupported without UE support and without LOB for 1-2 min   Fair- Requires Min A and UE support to maintain standing without loss of balance   Poor+ Requires mod A and UE support to maintain standing without loss of balance   Poor Requires max A and UE support to maintain standing balance without loss    Standing Dynamic Balance  Normal Stand independently unsupported, able to weight shift and cross midline maximally   Good Stand independently unsupported, able to weight shift and cross midline moderately   Good-/Fair+ Stand independently unsupported, able to weight shift across midline minimally x  Fair Stand independently unsupported, weight shift, and reach ipsilaterally, loss of balance when crossing midline   Poor+ Able to stand with Min A and reach ipsilaterally, unable to weight shift   Poor Able to stand with Mod A and minimally reach ipsilaterally, unable to cross midline.       GAIT:  Patient ambulates without AD and has decreased arm swing and decreased gait speed and is able to ambulate intermediate and short distances and unable to ambulate long distances   OUTCOME MEASURES: TEST Outcome Interpretation  5 times sit<>stand 12.75sec >60 yo, >15 sec indicates increased risk for falls  10 meter walk test   1.11              m/s <1.0 m/s indicates increased risk for falls; limited community ambulator  Timed up and Go 6.52                sec <14 sec indicates increased risk for falls  6 minute walk  test  1045          Feet 1000 feet is community ambulator      [address] Test L:20.57               R: 20.96                          PT Education - 12/25/17 1422    Education provided  Yes    Education Details  plan of care    Person(s) Educated  Patient    Methods  Explanation    Comprehension  Verbalized understanding          PT Long Term Goals - 12/26/17 2542      PT LONG TERM GOAL #1   Title  Patient will be able to stand with unilateral upper extremity support for > 30  minutes in order to independently perform ADL's.    Time  4    Period  Weeks    Status  New    Target Date  01/22/18      PT LONG TERM GOAL #2   Title  Patient will be able to reach > 12  inches without loss of balance to reduce fall risk with functional activities.    Time  4    Period  Weeks    Status  New    Target Date  01/22/18      PT LONG TERM GOAL #3   Title  Pt will increase 6 minute walk time by 150 feet to improve endurance and aid in improving mobility    Time  4    Period  Weeks    Status  New    Target Date  01/22/18      PT LONG TERM GOAL #4   Title  Patient will be independent with home exercise program for self-management of ankle symptoms/ difficulty.    Time  4    Period  Weeks    Status  New    Target Date  01/22/18             Plan - 12/26/17 7062    Clinical Impression Statement  Patient presents with dx of parkinsons disease and LSVT BIG PT. She has decreased gait speed with decreased arm swing and decreased dyanmic standing balance and decreased coordination. she has difficulty with rolling over, putting on her socks, wahsing her hair and writing. she will benefit from skilled PT to improve balance and coordination and quality of life.     History and Personal Factors relevant to plan of care:  Patient has had onset of parkinsons disease beginning 2016 and lives in independent apartment at retirement home. She has not had any falls in the  past 6 months but has unsteady gait and is having more difficulty with coordination and balance.     Clinical Presentation  Stable    Clinical Decision Making  Low    Rehab Potential  Good    PT Frequency  4x / week    PT Duration  4 weeks    PT Treatment/Interventions  Therapeutic activities;Therapeutic exercise;Gait training;Balance training    PT Next Visit Plan  LSBT BIG    PT Home Exercise Plan  LSVT BIG    Consulted and Agree with Plan of Care  Patient       Patient will benefit from skilled therapeutic intervention in order to improve the following deficits and impairments:  Decreased balance,  Decreased endurance, Decreased mobility, Difficulty walking, Decreased coordination, Decreased activity tolerance  Visit Diagnosis: Parkinson disease ([place])  Difficulty walking  Muscle weakness (generalized)  Other lack of coordination     Problem List Patient Active Problem List   Diagnosis Date Noted  . Chronic fatigue 10/23/2017  . Lesion of left lung 10/23/2017  . Genetic testing 05/01/2017  . Well woman exam 12/27/2016  . Parkinson disease ([place]) 01/10/2016  . DNR (do not resuscitate) 05/22/2015  . Tremor 02/07/2015  . Dysphagia, pharyngoesophageal phase 02/07/2015  . Hypercalcemia 09/14/2013  . Unspecified vitamin D deficiency 04/27/2013  . Breast cancer of upper-outer quadrant of left female breast ([place]) 09/16/2012  . Osteoporosis, post-menopausal 03/14/2012  . Aortic aneurysm ([place]) 01/01/2011  . OSTEOARTHRITIS, CERVICAL SPINE 08/15/2009  . HYPERGLYCEMIA, FASTING 12/06/2008  . ALLERGIC RHINITIS 03/25/2008  . Chronic obstructive airway disease with asthma ([place]) 03/25/2008  . BREAST CANCER, HX OF 03/10/2008  . Mixed hyperlipidemia 02/10/2007  . Essential hypertension 02/10/2007    Alanson Puls, [place] DPT 12/26/2017, 10:58 AM  [place] MAIN [facility] SERVICES [address] [place], [place], [address] Phone: [phone]    Fax:  [phone]  Name: Crystal Bass MRN: [id number] Date of Birth: 1941-06-26

## 2017-12-27 ENCOUNTER — Other Ambulatory Visit: Payer: Self-pay | Admitting: Family Medicine

## 2017-12-30 ENCOUNTER — Ambulatory Visit: Payer: Medicare Other | Attending: Neurology | Admitting: Physical Therapy

## 2017-12-30 ENCOUNTER — Other Ambulatory Visit: Payer: Self-pay | Admitting: Obstetrics & Gynecology

## 2017-12-30 ENCOUNTER — Encounter: Payer: Self-pay | Admitting: Physical Therapy

## 2017-12-30 DIAGNOSIS — M6281 Muscle weakness (generalized): Secondary | ICD-10-CM | POA: Diagnosis present

## 2017-12-30 DIAGNOSIS — R278 Other lack of coordination: Secondary | ICD-10-CM | POA: Diagnosis present

## 2017-12-30 DIAGNOSIS — R460 Very low level of personal hygiene: Secondary | ICD-10-CM | POA: Diagnosis present

## 2017-12-30 DIAGNOSIS — R262 Difficulty in walking, not elsewhere classified: Secondary | ICD-10-CM | POA: Diagnosis present

## 2017-12-30 DIAGNOSIS — G20 Parkinson's disease: Secondary | ICD-10-CM | POA: Insufficient documentation

## 2017-12-30 DIAGNOSIS — Z741 Need for assistance with personal care: Secondary | ICD-10-CM

## 2017-12-30 NOTE — Therapy (Addendum)
[place] MAIN [facility] SERVICES [place], [place], [address] Phone: [phone]   Fax:  [phone]  Physical Therapy Treatment  Patient Details  Name: Crystal Bass MRN: [id number] Date of Birth: 02-Apr-1941 Referring Provider: Ludwig Clarks    Encounter Date: 12/30/2017  PT End of Session - 12/30/17 1408    Visit Number  2    Number of Visits  17    Date for PT Re-Evaluation  01/29/18    PT Start Time  0205    PT Stop Time  0300    PT Time Calculation (min)  55 min    Equipment Utilized During Treatment  Gait belt    Activity Tolerance  Patient tolerated treatment well;Patient limited by fatigue    Behavior During Therapy  [facility] for tasks assessed/performed       Past Medical History:  Diagnosis Date  . 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 Aortic root aneurysm ([place])   . Breast cancer ([place]) 2002   infiltrative ductal carcinoma   . COPD (chronic obstructive pulmonary disease) ([place]) 9/08  . Ductal carcinoma of breast, estrogen receptor positive, stage 1 ([place]) 09/16/2012  . Endometriosis   . Hypercalcemia 09/14/2013  . Lesion of breast 1992   right, benign  . Lung disease    secondary to [name] infection  . Mastalgia 7/95  . Osteoporosis, post-menopausal 03/14/2012  . Ovarian cancer ([place]) 2004  . Pancreatitis    secondary to Cholelithiasis  . Parkinson disease ([place]) 02/10/15  . Post-thoracotomy pain syndrome   . Pseudomonas pneumonia ([place]) 5/09  . Uterine fibroid   . Vitamin D deficiency     Past Surgical History:  Procedure Laterality Date  . 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 APPENDECTOMY  2004  . BREAST BIOPSY  08/2004   right breast-benign  . BREAST LUMPECTOMY  1992   benign  . CARDIAC CATHETERIZATION  2007   essentially negation for significant CAD  . CHOLECYSTECTOMY  2001  . COLONOSCOPY  2014   Dr [name] , due 2019  . LUNG  BIOPSY  2002   [name], Dr [name]  . MASTECTOMY MODIFIED RADICAL Left 2002   oral chemotheraphy (tamoxifen then Armidex) no radiation, Dr.[name]  . TONSILLECTOMY  1946    There were no vitals filed for this visit.  Subjective Assessment - 12/30/17 1407    Subjective  Patient is having difficulty with walking intermediate distances and long distances. She is having unsteadness and balance deficits.     Limitations  Standing;Walking;Writing    Currently in Pain?  No/denies    Pain Score  0-No pain    Pain Onset  Today    Multiple Pain Sites  No       Patient seen for LSVT Daily Session Maximal Daily Exercises for facilitation/coordination of movement Sustained movements are designed to rescale the amplitude of movement output for generalization to daily functional activities .Performed as follows for 1 set of 10 repetitions each multidirectional sustained movements  1) Floor to ceiling , cues to hold for 10 seconds 2) Side to side multidirectional Repetitive movements performed in standing and are designed to provide retraining effort needed for sustained muscle activation in tasks    3) Step and reach forward , cues for good knee flex 4) Step and reach backwards , cues for BUE back 5)  Step and reach sideways, cues to turn her head sideways  6) Rock and reach forward/backward , cues to reach far fwd with UE's 7) Rock and reach sideways, cues for twist and look behind her 1. Sit to stand functional component task with supervision 5 reps and simulated activities for:  2. hand coordination tasks such as buttons, writing 3.  Rolling left and right in bed 4. Putting on socks and shoes/ dynamic stand balance practice such as ; toe tapping on blue foam 5. Washing her hair                       PT Education - 12/30/17 1407    Education provided  Yes    Education Details  Patient performed LSVT BIG    Person(s) Educated  Patient    Methods  Explanation;Demonstration     Comprehension  Verbalized understanding;Returned demonstration          PT Long Term Goals - 12/26/17 0828      PT LONG TERM GOAL #1   Title  Patient will be able to stand with unilateral upper extremity support for > 30  minutes in order to independently perform ADL's.    Time  4    Period  Weeks    Status  New    Target Date  01/22/18      PT LONG TERM GOAL #2   Title  Patient will be able to reach > 12  inches without loss of balance to reduce fall risk with functional activities.    Time  4    Period  Weeks    Status  New    Target Date  01/22/18      PT LONG TERM GOAL #3   Title  Pt will increase 6 minute walk time by 150 feet to improve endurance and aid in improving mobility    Time  4    Period  Weeks    Status  New    Target Date  01/22/18      PT LONG TERM GOAL #4   Title  Patient will be independent with home exercise program for self-management of ankle symptoms/ difficulty.    Time  4    Period  Weeks    Status  New    Target Date  01/22/18            Plan - 12/30/17 1410    Clinical Impression Statement  Mod cueing needed to appropriately perform LSVT tasks with leg, hand, and head position. Decreased coordination demonstrated requiring consistent verbal cueing to correct form. Cognitive understanding of task was mildly delayed. Patient continues to demonstrate some in coordination of movement with select exercises such as rock and reach and stepping backwards. Patient responds well to verbal and tactile cues to correct form and technique.  CGA to SBA for safety with activities.  Cues to increase intensity and amplitude of movements throughout session    Clinical Presentation  Stable    Clinical Decision Making  Low    Rehab Potential  Good    PT Frequency  4x / week    PT Duration  4 weeks    PT Treatment/Interventions  Therapeutic activities;Therapeutic exercise;Gait training;Balance training    PT Next Visit Plan  LSBT BIG    PT Home Exercise  Plan  LSVT BIG    Consulted and Agree with Plan of Care  Patient       Patient will benefit  from skilled therapeutic intervention in order to improve the following deficits and impairments:  Decreased balance, Decreased endurance, Decreased mobility, Difficulty walking, Decreased coordination, Decreased activity tolerance  Visit Diagnosis: Parkinson disease ([place])  Difficulty walking  Muscle weakness (generalized)  Other lack of coordination  Self-care deficit for hygiene     Problem List Patient Active Problem List   Diagnosis Date Noted  . Chronic fatigue 10/23/2017  . Lesion of left lung 10/23/2017  . Genetic testing 05/01/2017  . Well woman exam 12/27/2016  . Parkinson disease ([place]) 01/10/2016  . DNR (do not resuscitate) 05/22/2015  . Tremor 02/07/2015  . Dysphagia, pharyngoesophageal phase 02/07/2015  . Hypercalcemia 09/14/2013  . Unspecified vitamin D deficiency 04/27/2013  . Breast cancer of upper-outer quadrant of left female breast ([place]) 09/16/2012  . Osteoporosis, post-menopausal 03/14/2012  . Aortic aneurysm ([place]) 01/01/2011  . OSTEOARTHRITIS, CERVICAL SPINE 08/15/2009  . HYPERGLYCEMIA, FASTING 12/06/2008  . ALLERGIC RHINITIS 03/25/2008  . Chronic obstructive airway disease with asthma ([place]) 03/25/2008  . BREAST CANCER, HX OF 03/10/2008  . Mixed hyperlipidemia 02/10/2007  . Essential hypertension 02/10/2007    Alanson Puls, PT DPT 12/30/2017, 2:11 PM  [place] MAIN [facility] SERVICES [address] [place], [place], [address] Phone: [phone]   Fax:  [phone]  Name: Crystal Bass MRN: [id number] Date of Birth: October 02, 1941

## 2017-12-30 NOTE — Telephone Encounter (Signed)
Medication refill request: Vit D 50,000 IU Last AEX:  09-16-17 Next AEX: 11-25-18 Last MMG (if hormonal medication request): 11-01-17 Neg/BiRads1 Refill authorized: Please advise

## 2017-12-31 ENCOUNTER — Ambulatory Visit: Payer: Medicare Other | Admitting: Physical Therapy

## 2018-01-01 ENCOUNTER — Ambulatory Visit: Payer: Medicare Other | Admitting: Physical Therapy

## 2018-01-01 ENCOUNTER — Encounter: Payer: Self-pay | Admitting: Physical Therapy

## 2018-01-01 DIAGNOSIS — G20 Parkinson's disease: Secondary | ICD-10-CM | POA: Diagnosis not present

## 2018-01-01 DIAGNOSIS — R460 Very low level of personal hygiene: Secondary | ICD-10-CM

## 2018-01-01 DIAGNOSIS — M6281 Muscle weakness (generalized): Secondary | ICD-10-CM

## 2018-01-01 DIAGNOSIS — R262 Difficulty in walking, not elsewhere classified: Secondary | ICD-10-CM

## 2018-01-01 DIAGNOSIS — R278 Other lack of coordination: Secondary | ICD-10-CM

## 2018-01-01 DIAGNOSIS — Z741 Need for assistance with personal care: Secondary | ICD-10-CM

## 2018-01-01 NOTE — Therapy (Signed)
[place] MAIN [facility] SERVICES [place], [place], [address] Phone: [phone]   Fax:  [phone]  Physical Therapy Treatment  Patient Details  Name: Crystal Bass MRN: [id number] Date of Birth: 1941-04-30 Referring Provider: Ludwig Clarks    Encounter Date: 01/01/2018  PT End of Session - 01/01/18 1405    Visit Number  3    Number of Visits  17    Date for PT Re-Evaluation  01/29/18    PT Start Time  0200    PT Stop Time  0300    PT Time Calculation (min)  60 min    Equipment Utilized During Treatment  Gait belt    Activity Tolerance  Patient tolerated treatment well;Patient limited by fatigue    Behavior During Therapy  [facility] for tasks assessed/performed       Past Medical History:  Diagnosis Date  . 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 Aortic root aneurysm ([place])   . Breast cancer ([place]) 2002   infiltrative ductal carcinoma   . COPD (chronic obstructive pulmonary disease) ([place]) 9/08  . Ductal carcinoma of breast, estrogen receptor positive, stage 1 ([place]) 09/16/2012  . Endometriosis   . Hypercalcemia 09/14/2013  . Lesion of breast 1992   right, benign  . Lung disease    secondary to [name] infection  . Mastalgia 7/95  . Osteoporosis, post-menopausal 03/14/2012  . Ovarian cancer ([place]) 2004  . Pancreatitis    secondary to Cholelithiasis  . Parkinson disease ([place]) 02/10/15  . Post-thoracotomy pain syndrome   . Pseudomonas pneumonia ([place]) 5/09  . Uterine fibroid   . Vitamin D deficiency     Past Surgical History:  Procedure Laterality Date  . 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 APPENDECTOMY  2004  . BREAST BIOPSY  08/2004   right breast-benign  . BREAST LUMPECTOMY  1992   benign  . CARDIAC CATHETERIZATION  2007   essentially negation for significant CAD  . CHOLECYSTECTOMY  2001  . COLONOSCOPY  2014   Dr [name] , due 2019  . LUNG  BIOPSY  2002   [name], Dr [name]  . MASTECTOMY MODIFIED RADICAL Left 2002   oral chemotheraphy (tamoxifen then Armidex) no radiation, Dr.[name]  . TONSILLECTOMY  1946    There were no vitals filed for this visit.  Subjective Assessment - 01/01/18 1403    Subjective  Patient reports no pain but is having difficulty with her breathing.     Limitations  Standing;Walking;Writing    Currently in Pain?  No/denies    Pain Score  0-No pain    Pain Onset  Today       Patient seen for LSVT Daily Session Maximal Daily Exercises for facilitation/coordination of movement Sustained movements are designed to rescale the amplitude of movement output for generalization to daily functional activities .Performed as follows for 1 set of 10 repetitions each multidirectional sustained movements  1) Floor to ceiling , cues to hold for 10 seconds 2) Side to side multidirectional Repetitive movements performed in standing and are designed to provide retraining effort needed for sustained muscle activation in tasks   3) Step and reach forward , cues for good knee flex 4) Step and reach backwards , cues for BUE back 5) Step and reach sideways, cues to turn her head sideways  6) Rock and reach forward/backward ,  cues to reach far fwd with UE's 7) Rock and reach sideways, cues for twist and look behind her 1. Sit to stand functional component task with supervision 5 reps and simulated activities for:  2. hand coordination tasks such as buttons, writing 3. Hand strengthening tasks  4. dynamic stand balance practice such as ; toe tapping on blue foam 5.     Patient required verbal and tactile cueing during dynamic standing balance activities in order to maintain center of gravity. Patient required  SBA during all dynamic standing balance activities                    PT Education - 01/01/18 1404    Education provided  Yes    Education Details  LSVT BIG    Person(s) Educated  Patient     Methods  Explanation;Verbal cues    Comprehension  Verbalized understanding;Returned demonstration          PT Long Term Goals - 12/26/17 0828      PT LONG TERM GOAL #1   Title  Patient will be able to stand with unilateral upper extremity support for > 30  minutes in order to independently perform ADL's.    Time  4    Period  Weeks    Status  New    Target Date  01/22/18      PT LONG TERM GOAL #2   Title  Patient will be able to reach > 12  inches without loss of balance to reduce fall risk with functional activities.    Time  4    Period  Weeks    Status  New    Target Date  01/22/18      PT LONG TERM GOAL #3   Title  Pt will increase 6 minute walk time by 150 feet to improve endurance and aid in improving mobility    Time  4    Period  Weeks    Status  New    Target Date  01/22/18      PT LONG TERM GOAL #4   Title  Patient will be independent with home exercise program for self-management of ankle symptoms/ difficulty.    Time  4    Period  Weeks    Status  New    Target Date  01/22/18            Plan - 01/01/18 1405    Clinical Impression Statement  Continues to have balance deficits typical with diagnosis. Patient performs intermediate level exercises without pain behaviors and needs verbal cuing for postural alignment and head positioning. Cuing is needed to stretch the leg out in seated side reaching. Fatigue with sit to stand but demonstrating more control .    Rehab Potential  Good    PT Frequency  4x / week    PT Duration  4 weeks    PT Treatment/Interventions  Therapeutic activities;Therapeutic exercise;Gait training;Balance training;Neuromuscular re-education    PT Next Visit Plan  LSBT BIG    PT Home Exercise Plan  LSVT BIG    Consulted and Agree with Plan of Care  Patient       Patient will benefit from skilled therapeutic intervention in order to improve the following deficits and impairments:  Decreased balance, Decreased endurance, Decreased  mobility, Difficulty walking, Decreased coordination, Decreased activity tolerance  Visit Diagnosis: Parkinson disease ([place])  Difficulty walking  Muscle weakness (generalized)  Other lack of coordination  Self-care deficit for  hygiene     Problem List Patient Active Problem List   Diagnosis Date Noted  . Chronic fatigue 10/23/2017  . Lesion of left lung 10/23/2017  . Genetic testing 05/01/2017  . Well woman exam 12/27/2016  . Parkinson disease ([place]) 01/10/2016  . DNR (do not resuscitate) 05/22/2015  . Tremor 02/07/2015  . Dysphagia, pharyngoesophageal phase 02/07/2015  . Hypercalcemia 09/14/2013  . Unspecified vitamin D deficiency 04/27/2013  . Breast cancer of upper-outer quadrant of left female breast ([place]) 09/16/2012  . Osteoporosis, post-menopausal 03/14/2012  . Aortic aneurysm ([place]) 01/01/2011  . OSTEOARTHRITIS, CERVICAL SPINE 08/15/2009  . HYPERGLYCEMIA, FASTING 12/06/2008  . ALLERGIC RHINITIS 03/25/2008  . Chronic obstructive airway disease with asthma ([place]) 03/25/2008  . BREAST CANCER, HX OF 03/10/2008  . Mixed hyperlipidemia 02/10/2007  . Essential hypertension 02/10/2007    Alanson Puls, PT DPT 01/01/2018, 2:08 PM  [place] MAIN [facility] SERVICES [address] [place], [place], [address] Phone: [phone]   Fax:  [phone]  Name: RYLEIGH ESQUEDA MRN: [id number] Date of Birth: [birth year]

## 2018-01-02 ENCOUNTER — Encounter: Payer: Self-pay | Admitting: Physical Therapy

## 2018-01-02 ENCOUNTER — Ambulatory Visit: Payer: Medicare Other | Admitting: Physical Therapy

## 2018-01-02 DIAGNOSIS — M6281 Muscle weakness (generalized): Secondary | ICD-10-CM

## 2018-01-02 DIAGNOSIS — R278 Other lack of coordination: Secondary | ICD-10-CM

## 2018-01-02 DIAGNOSIS — G20 Parkinson's disease: Secondary | ICD-10-CM | POA: Diagnosis not present

## 2018-01-02 DIAGNOSIS — R262 Difficulty in walking, not elsewhere classified: Secondary | ICD-10-CM

## 2018-01-02 NOTE — Therapy (Signed)
[place] MAIN [facility] SERVICES [address] [place], [place], [address] Phone: [phone]   Fax:  [phone]  Physical Therapy Treatment  Patient Details  Name: Crystal Bass MRN: [id number] Date of Birth: [birth year] Referring Provider: Ludwig Clarks    Encounter Date: 01/02/2018  PT End of Session - 01/02/18 1540    Visit Number  4    Number of Visits  17    Date for PT Re-Evaluation  01/29/18    PT Start Time  0200    PT Stop Time  0300    PT Time Calculation (min)  60 min    Equipment Utilized During Treatment  Gait belt    Activity Tolerance  Patient tolerated treatment well;Patient limited by fatigue    Behavior During Therapy  [facility] for tasks assessed/performed       Past Medical History:  Diagnosis Date  . 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 Aortic root aneurysm ([place])   . Breast cancer ([place]) 2002   infiltrative ductal carcinoma   . COPD (chronic obstructive pulmonary disease) ([place]) 9/08  . Ductal carcinoma of breast, estrogen receptor positive, stage 1 ([place]) 09/16/2012  . Endometriosis   . Hypercalcemia 09/14/2013  . Lesion of breast 1992   right, benign  . Lung disease    secondary to [name] infection  . Mastalgia 7/95  . Osteoporosis, post-menopausal 03/14/2012  . Ovarian cancer ([place]) 2004  . Pancreatitis    secondary to Cholelithiasis  . Parkinson disease ([place]) 02/10/15  . Post-thoracotomy pain syndrome   . Pseudomonas pneumonia ([place]) 5/09  . Uterine fibroid   . Vitamin D deficiency     Past Surgical History:  Procedure Laterality Date  . 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 APPENDECTOMY  2004  . BREAST BIOPSY  08/2004   right breast-benign  . BREAST LUMPECTOMY  1992   benign  . CARDIAC CATHETERIZATION  2007   essentially negation for significant CAD  . CHOLECYSTECTOMY  2001  . COLONOSCOPY  2014   Dr [name] , due 2019  . LUNG  BIOPSY  2002   [name], Dr [name]  . MASTECTOMY MODIFIED RADICAL Left 2002   oral chemotheraphy (tamoxifen then Armidex) no radiation, Dr.[name]  . TONSILLECTOMY  1946    There were no vitals filed for this visit.  Subjective Assessment - 01/02/18 1539    Subjective  Patient reports no pain but is having difficulty with her breathing.     Limitations  Standing;Walking;Writing    Currently in Pain?  No/denies    Pain Score  0-No pain    Pain Onset  Today       Patient seen for LSVT Daily Session Maximal Daily Exercises for facilitation/coordination of movement Sustained movements are designed to rescale the amplitude of movement output for generalization to daily functional activities .Performed as follows for 1 set of 10 repetitions each multidirectional sustained movements  1) Floor to ceiling, cues to hold for 10 seconds 2) Side to side multidirectional Repetitive movements performed in standing and are designed to provide retraining effort needed for sustained muscle activation in tasks  3) Step and reach forward , cues for good knee flex 4) Step and reach backwards, cues for BUE back 5) Step and reach sideways, cues to turn her head sideways 6) Rock and reach forward/backward , cues to reach  far fwd with UE's 7) Rock and reach sideways, cues for twist and look behind her 1.Sit to stand functional component task with supervision 5 reps and simulated activities for: 2.hand coordination tasks such as buttons, writing 3. Hand strengthening tasks  4. dynamic stand balance practice such as ; toe tapping on blue foam 5.   Patient required verbal and tactile cueing during dynamic standing balance activities in order to maintain center of gravity. Patient required  SBA during all dynamic standing balance activities                        PT Education - 01/02/18 1539    Education provided  Yes    Education Details  lsvt big    Person(s) Educated  Patient     Methods  Explanation;Demonstration    Comprehension  Verbalized understanding;Returned demonstration          PT Long Term Goals - 12/26/17 0828      PT LONG TERM GOAL #1   Title  Patient will be able to stand with unilateral upper extremity support for > 30  minutes in order to independently perform ADL's.    Time  4    Period  Weeks    Status  New    Target Date  01/22/18      PT LONG TERM GOAL #2   Title  Patient will be able to reach > 12  inches without loss of balance to reduce fall risk with functional activities.    Time  4    Period  Weeks    Status  New    Target Date  01/22/18      PT LONG TERM GOAL #3   Title  Pt will increase 6 minute walk time by 150 feet to improve endurance and aid in improving mobility    Time  4    Period  Weeks    Status  New    Target Date  01/22/18      PT LONG TERM GOAL #4   Title  Patient will be independent with home exercise program for self-management of ankle symptoms/ difficulty.    Time  4    Period  Weeks    Status  New    Target Date  01/22/18            Plan - 01/02/18 1540    Clinical Impression Statement  Patient has better stepping pattern with less stopping and better posture, Patient has difficulty with finishing BIG and needs extra cuing to perform exercises with correct amplitude and speed. Patient has difficulty with turning  head and rotating  trunk with weight shifting exercises. Patient has fatigue with standing exercises and needs constant VC to have correct posture. Patient has loss of balance and needs UE support intermittently thorough out exercise. Patient has slowness of movement during rotation and beginning movements.    Rehab Potential  Good    PT Frequency  4x / week    PT Duration  4 weeks    PT Treatment/Interventions  Therapeutic activities;Therapeutic exercise;Gait training;Balance training;Neuromuscular re-education    PT Next Visit Plan  LSBT BIG    PT Home Exercise Plan  LSVT BIG     Consulted and Agree with Plan of Care  Patient       Patient will benefit from skilled therapeutic intervention in order to improve the following deficits and impairments:  Decreased balance, Decreased endurance, Decreased mobility, Difficulty  walking, Decreased coordination, Decreased activity tolerance  Visit Diagnosis: Parkinson disease ([place])  Difficulty walking  Muscle weakness (generalized)  Other lack of coordination     Problem List Patient Active Problem List   Diagnosis Date Noted  . Chronic fatigue 10/23/2017  . Lesion of left lung 10/23/2017  . Genetic testing 05/01/2017  . Well woman exam 12/27/2016  . Parkinson disease ([place]) 01/10/2016  . DNR (do not resuscitate) 05/22/2015  . Tremor 02/07/2015  . Dysphagia, pharyngoesophageal phase 02/07/2015  . Hypercalcemia 09/14/2013  . Unspecified vitamin D deficiency 04/27/2013  . Breast cancer of upper-outer quadrant of left female breast ([place]) 09/16/2012  . Osteoporosis, post-menopausal 03/14/2012  . Aortic aneurysm ([place]) 01/01/2011  . OSTEOARTHRITIS, CERVICAL SPINE 08/15/2009  . HYPERGLYCEMIA, FASTING 12/06/2008  . ALLERGIC RHINITIS 03/25/2008  . Chronic obstructive airway disease with asthma ([place]) 03/25/2008  . BREAST CANCER, HX OF 03/10/2008  . Mixed hyperlipidemia 02/10/2007  . Essential hypertension 02/10/2007    Alanson Puls, [place] DPT 01/02/2018, 3:41 PM  [place] MAIN [facility] SERVICES [address] [place], [place], [address] Phone: [phone]   Fax:  [phone]  Name: Crystal Bass MRN: [id number] Date of Birth: 1941-02-13

## 2018-01-06 ENCOUNTER — Encounter: Payer: Self-pay | Admitting: Physical Therapy

## 2018-01-06 ENCOUNTER — Ambulatory Visit: Payer: Medicare Other | Admitting: Physical Therapy

## 2018-01-06 DIAGNOSIS — R262 Difficulty in walking, not elsewhere classified: Secondary | ICD-10-CM

## 2018-01-06 DIAGNOSIS — R460 Very low level of personal hygiene: Secondary | ICD-10-CM

## 2018-01-06 DIAGNOSIS — Z741 Need for assistance with personal care: Secondary | ICD-10-CM

## 2018-01-06 DIAGNOSIS — G20 Parkinson's disease: Secondary | ICD-10-CM | POA: Diagnosis not present

## 2018-01-06 DIAGNOSIS — R278 Other lack of coordination: Secondary | ICD-10-CM

## 2018-01-06 DIAGNOSIS — M6281 Muscle weakness (generalized): Secondary | ICD-10-CM

## 2018-01-06 NOTE — Therapy (Signed)
[place] MAIN [facility] SERVICES [address] [place], [place], [address] Phone: [phone]   Fax:  [phone]  Physical Therapy Treatment  Patient Details  Name: Crystal Bass MRN: [id number] Date of Birth: 22-Sep-1941 Referring Provider: Ludwig Clarks    Encounter Date: 01/06/2018  PT End of Session - 01/06/18 1418    Visit Number  5    Number of Visits  17    Date for PT Re-Evaluation  01/29/18    PT Start Time  0200    PT Stop Time  0245    PT Time Calculation (min)  45 min    Equipment Utilized During Treatment  Gait belt    Activity Tolerance  Patient tolerated treatment well;Patient limited by fatigue    Behavior During Therapy  [facility] for tasks assessed/performed       Past Medical History:  Diagnosis Date  . 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 Aortic root aneurysm ([place])   . Breast cancer ([place]) 2002   infiltrative ductal carcinoma   . COPD (chronic obstructive pulmonary disease) ([place]) 9/08  . Ductal carcinoma of breast, estrogen receptor positive, stage 1 ([place]) 09/16/2012  . Endometriosis   . Hypercalcemia 09/14/2013  . Lesion of breast 1992   right, benign  . Lung disease    secondary to [name] infection  . Mastalgia 7/95  . Osteoporosis, post-menopausal 03/14/2012  . Ovarian cancer ([place]) 2004  . Pancreatitis    secondary to Cholelithiasis  . Parkinson disease ([place]) 02/10/15  . Post-thoracotomy pain syndrome   . Pseudomonas pneumonia ([place]) 5/09  . Uterine fibroid   . Vitamin D deficiency     Past Surgical History:  Procedure Laterality Date  . 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 APPENDECTOMY  2004  . BREAST BIOPSY  08/2004   right breast-benign  . BREAST LUMPECTOMY  1992   benign  . CARDIAC CATHETERIZATION  2007   essentially negation for significant CAD  . CHOLECYSTECTOMY  2001  . COLONOSCOPY  2014   Dr [name] , due 2019  . LUNG  BIOPSY  2002   [name], Dr [name]  . MASTECTOMY MODIFIED RADICAL Left 2002   oral chemotheraphy (tamoxifen then Armidex) no radiation, Dr.[name]  . TONSILLECTOMY  1946    There were no vitals filed for this visit.  Subjective Assessment - 01/06/18 1413    Subjective  Patient reports no pain but is having difficulty with her breathing.     Limitations  Standing;Walking;Writing    Currently in Pain?  No/denies    Pain Score  0-No pain    Pain Onset  Today    Multiple Pain Sites  No       Patient seen for LSVT Daily Session Maximal Daily Exercises for facilitation/coordination of movement Sustained movements are designed to rescale the amplitude of movement output for generalization to daily functional activities .Performed as follows for 1 set of 10 repetitions each multidirectional sustained movements  1) Floor to ceiling  2) Side to side multidirectional Repetitive movements performed in standing and are designed to provide retraining effort needed for sustained muscle activation in tasks    3) Step and reach forward  4) Step and reach backwards  5) Step and reach sideways  6) Rock and reach forward/backward  7) Rock and reach sideways Sit to stand functional component task  with supervision 5 reps and simulated activities for: hand coordination tasks such as buttons and putting on his coat, getting  In and out of the car, carrying items .  Patient continues to demonstrates less incoordination of movement with select exercises such as rock and reach and stepping backwards. Patient responds well to verbal and tactile cues to correct form and technique. Patient is able to catch mistakes in technique with incorrect positions and is able remember the start and finish positions. Motor control of LE much improved.                         PT Education - 01/06/18 1414    Education provided  Yes    Education Details  LSVT BIG    Person(s) Educated  Patient    Methods   Explanation;Verbal cues    Comprehension  Verbalized understanding;Returned demonstration          PT Long Term Goals - 12/26/17 0828      PT LONG TERM GOAL #1   Title  Patient will be able to stand with unilateral upper extremity support for > 30  minutes in order to independently perform ADL's.    Time  4    Period  Weeks    Status  New    Target Date  01/22/18      PT LONG TERM GOAL #2   Title  Patient will be able to reach > 12  inches without loss of balance to reduce fall risk with functional activities.    Time  4    Period  Weeks    Status  New    Target Date  01/22/18      PT LONG TERM GOAL #3   Title  Pt will increase 6 minute walk time by 150 feet to improve endurance and aid in improving mobility    Time  4    Period  Weeks    Status  New    Target Date  01/22/18      PT LONG TERM GOAL #4   Title  Patient will be independent with home exercise program for self-management of ankle symptoms/ difficulty.    Time  4    Period  Weeks    Status  New    Target Date  01/22/18            Plan - 01/06/18 1419    Clinical Impression Statement  Mod cueing needed to appropriately perform LSVT tasks with leg, hand, and head position. Decreased coordination demonstrated requiring consistent verbal cueing to correct form. Cognitive understanding of task was mildly delayed. Patient continues to demonstrate some in coordination of movement with select exercises such as rock and reach and stepping backwards. Patient responds well to verbal and tactile cues to correct form and technique. CGA to SBA for safety with activities. Cues to increase intensity and amplitude of movements throughout session    Rehab Potential  Good    PT Frequency  4x / week    PT Duration  4 weeks    PT Treatment/Interventions  Therapeutic activities;Therapeutic exercise;Gait training;Balance training;Neuromuscular re-education    PT Next Visit Plan  LSBT BIG    PT Home Exercise Plan  LSVT BIG     Consulted and Agree with Plan of Care  Patient       Patient will benefit from skilled therapeutic intervention in order to improve the following deficits and impairments:  Decreased balance,  Decreased endurance, Decreased mobility, Difficulty walking, Decreased coordination, Decreased activity tolerance  Visit Diagnosis: Parkinson disease ([place])  Difficulty walking  Muscle weakness (generalized)  Other lack of coordination  Self-care deficit for hygiene     Problem List Patient Active Problem List   Diagnosis Date Noted  . Chronic fatigue 10/23/2017  . Lesion of left lung 10/23/2017  . Genetic testing 05/01/2017  . Well woman exam 12/27/2016  . Parkinson disease ([place]) 01/10/2016  . DNR (do not resuscitate) 05/22/2015  . Tremor 02/07/2015  . Dysphagia, pharyngoesophageal phase 02/07/2015  . Hypercalcemia 09/14/2013  . Unspecified vitamin D deficiency 04/27/2013  . Breast cancer of upper-outer quadrant of left female breast ([place]) 09/16/2012  . Osteoporosis, post-menopausal 03/14/2012  . Aortic aneurysm ([place]) 01/01/2011  . OSTEOARTHRITIS, CERVICAL SPINE 08/15/2009  . HYPERGLYCEMIA, FASTING 12/06/2008  . ALLERGIC RHINITIS 03/25/2008  . Chronic obstructive airway disease with asthma ([place]) 03/25/2008  . BREAST CANCER, HX OF 03/10/2008  . Mixed hyperlipidemia 02/10/2007  . Essential hypertension 02/10/2007    Alanson Puls, [place] DPT 01/06/2018, 3:56 PM  [place] MAIN [employer] [facility] [employer] SERVICES [address] [place], [place], [address] Phone: [phone]   Fax:  [phone]  Name: Crystal Bass MRN: [id number] Date of Birth: 1941/05/09

## 2018-01-07 ENCOUNTER — Encounter: Payer: Self-pay | Admitting: Physical Therapy

## 2018-01-07 ENCOUNTER — Ambulatory Visit: Payer: Medicare Other | Admitting: Physical Therapy

## 2018-01-07 DIAGNOSIS — G20 Parkinson's disease: Secondary | ICD-10-CM | POA: Diagnosis not present

## 2018-01-07 DIAGNOSIS — R278 Other lack of coordination: Secondary | ICD-10-CM

## 2018-01-07 DIAGNOSIS — R262 Difficulty in walking, not elsewhere classified: Secondary | ICD-10-CM

## 2018-01-07 DIAGNOSIS — R460 Very low level of personal hygiene: Secondary | ICD-10-CM

## 2018-01-07 DIAGNOSIS — Z741 Need for assistance with personal care: Secondary | ICD-10-CM

## 2018-01-07 DIAGNOSIS — M6281 Muscle weakness (generalized): Secondary | ICD-10-CM

## 2018-01-07 NOTE — Therapy (Signed)
[place] MAIN [facility] SERVICES [address] [place], [place], [address] Phone: [phone]   Fax:  [phone]  Physical Therapy Treatment  Patient Details  Name: Crystal Bass MRN: [id number] Date of Birth: March 25, 1941 Referring Provider: Ludwig Clarks    Encounter Date: 01/07/2018  PT End of Session - 01/07/18 1414    Visit Number  6    Number of Visits  17    Date for PT Re-Evaluation  01/29/18    PT Start Time  0200    PT Stop Time  0255    PT Time Calculation (min)  55 min    Equipment Utilized During Treatment  Gait belt    Activity Tolerance  Patient tolerated treatment well;Patient limited by fatigue    Behavior During Therapy  [facility] for tasks assessed/performed       Past Medical History:  Diagnosis Date  . 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 Aortic root aneurysm ([place])   . Breast cancer ([place]) 2002   infiltrative ductal carcinoma   . COPD (chronic obstructive pulmonary disease) ([place]) 9/08  . Ductal carcinoma of breast, estrogen receptor positive, stage 1 ([place]) 09/16/2012  . Endometriosis   . Hypercalcemia 09/14/2013  . Lesion of breast 1992   right, benign  . Lung disease    secondary to [name] infection  . Mastalgia 7/95  . Osteoporosis, post-menopausal 03/14/2012  . Ovarian cancer ([place]) 2004  . Pancreatitis    secondary to Cholelithiasis  . Parkinson disease ([place]) 02/10/15  . Post-thoracotomy pain syndrome   . Pseudomonas pneumonia ([place]) 5/09  . Uterine fibroid   . Vitamin D deficiency     Past Surgical History:  Procedure Laterality Date  . 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 APPENDECTOMY  2004  . BREAST BIOPSY  08/2004   right breast-benign  . BREAST LUMPECTOMY  1992   benign  . CARDIAC CATHETERIZATION  2007   essentially negation for significant CAD  . CHOLECYSTECTOMY  2001  . COLONOSCOPY  2014   Dr [name] , due 2019  . LUNG  BIOPSY  2002   [name], Dr [name]  . MASTECTOMY MODIFIED RADICAL Left 2002   oral chemotheraphy (tamoxifen then Armidex) no radiation, Dr.[name]  . TONSILLECTOMY  1946    There were no vitals filed for this visit.  Subjective Assessment - 01/07/18 1413    Subjective  Patient reports no pain but is having difficulty with her breathing.     Limitations  Standing;Walking;Writing    Currently in Pain?  No/denies    Pain Score  0-No pain    Pain Onset  Today    Multiple Pain Sites  No       Patient seen for LSVT Daily Session Maximal Daily Exercises for facilitation/coordination of movement Sustained movements are designed to rescale the amplitude of movement output for generalization to daily functional activities .Performed as follows for 1 set of 10 repetitions each multidirectional sustained movements  1) Floor to ceiling, cues to hold for 10 seconds 2) Side to side multidirectional Repetitive movements performed in standing and are designed to provide retraining effort needed for sustained muscle activation in tasks  3) Step and reach forward , cues for good knee flex 4) Step and reach backwards, cues for BUE back 5) Step and reach sideways, cues to turn her head sideways 6)  Rock and reach forward/backward , cues to reach far fwd with UE's 7) Rock and reach sideways, cues for twist and look behind her 1.Sit to stand functional component task with supervision 5 reps and simulated activities for: 2.hand coordination tasks such as buttons, writing 3.Hand strengthening tasks 4. dynamic stand balance practice such as ; toe tapping on blue foam 5. Patient required verbal and tactile cueing during dynamic standing balance activities in order to maintain center of gravity. Patient requiredSBAduring all dynamic standing balance activities                        PT Education - 01/07/18 1414    Education provided  Yes    Education Details  LSVT BIG     Person(s) Educated  Patient    Methods  Explanation;Demonstration;Verbal cues    Comprehension  Verbalized understanding;Returned demonstration;Verbal cues required          PT Long Term Goals - 12/26/17 1610      PT LONG TERM GOAL #1   Title  Patient will be able to stand with unilateral upper extremity support for > 30  minutes in order to independently perform ADL's.    Time  4    Period  Weeks    Status  New    Target Date  01/22/18      PT LONG TERM GOAL #2   Title  Patient will be able to reach > 12  inches without loss of balance to reduce fall risk with functional activities.    Time  4    Period  Weeks    Status  New    Target Date  01/22/18      PT LONG TERM GOAL #3   Title  Pt will increase 6 minute walk time by 150 feet to improve endurance and aid in improving mobility    Time  4    Period  Weeks    Status  New    Target Date  01/22/18      PT LONG TERM GOAL #4   Title  Patient will be independent with home exercise program for self-management of ankle symptoms/ difficulty.    Time  4    Period  Weeks    Status  New    Target Date  01/22/18            Plan - 01/07/18 1418    Clinical Impression Statement  Pt requires direction and verbal cues for correct performance of lsvt big exercises. Patient has fatigue with endurance with breathing.   Patient struggles with speed during movement as well as balance with unstable surfaces. Pt encouraged to continue HEP   Patient will benefit from continued skilled PT to improve mobility and safety.    Rehab Potential  Good    PT Frequency  4x / week    PT Duration  4 weeks    PT Treatment/Interventions  Therapeutic activities;Therapeutic exercise;Gait training;Balance training;Neuromuscular re-education    PT Next Visit Plan  LSBT BIG    PT Home Exercise Plan  LSVT BIG    Consulted and Agree with Plan of Care  Patient       Patient will benefit from skilled therapeutic intervention in order to improve the  following deficits and impairments:  Decreased balance, Decreased endurance, Decreased mobility, Difficulty walking, Decreased coordination, Decreased activity tolerance  Visit Diagnosis: Parkinson disease ([place])  Difficulty walking  Muscle weakness (generalized)  Other lack of  coordination  Self-care deficit for hygiene     Problem List Patient Active Problem List   Diagnosis Date Noted  . Chronic fatigue 10/23/2017  . Lesion of left lung 10/23/2017  . Genetic testing 05/01/2017  . Well woman exam 12/27/2016  . Parkinson disease ([place]) 01/10/2016  . DNR (do not resuscitate) 05/22/2015  . Tremor 02/07/2015  . Dysphagia, pharyngoesophageal phase 02/07/2015  . Hypercalcemia 09/14/2013  . Unspecified vitamin D deficiency 04/27/2013  . Breast cancer of upper-outer quadrant of left female breast ([place]) 09/16/2012  . Osteoporosis, post-menopausal 03/14/2012  . Aortic aneurysm ([place]) 01/01/2011  . OSTEOARTHRITIS, CERVICAL SPINE 08/15/2009  . HYPERGLYCEMIA, FASTING 12/06/2008  . ALLERGIC RHINITIS 03/25/2008  . Chronic obstructive airway disease with asthma ([place]) 03/25/2008  . BREAST CANCER, HX OF 03/10/2008  . Mixed hyperlipidemia 02/10/2007  . Essential hypertension 02/10/2007    [name] S,PT DPT 01/07/2018, 2:22 PM  [place] MAIN [facility] SERVICES [address] [place], [place], [address] Phone: [phone]   Fax:  [phone]  Name: Crystal Bass MRN: [id number] Date of Birth: [birth year]

## 2018-01-08 ENCOUNTER — Encounter: Payer: Self-pay | Admitting: Physical Therapy

## 2018-01-08 ENCOUNTER — Ambulatory Visit: Payer: Medicare Other | Admitting: Physical Therapy

## 2018-01-08 DIAGNOSIS — R262 Difficulty in walking, not elsewhere classified: Secondary | ICD-10-CM

## 2018-01-08 DIAGNOSIS — G20 Parkinson's disease: Secondary | ICD-10-CM

## 2018-01-08 DIAGNOSIS — M6281 Muscle weakness (generalized): Secondary | ICD-10-CM

## 2018-01-08 DIAGNOSIS — R278 Other lack of coordination: Secondary | ICD-10-CM

## 2018-01-08 NOTE — Therapy (Signed)
[place] MAIN [facility] SERVICES [address] [place], [place], [address] Phone: [phone]   Fax:  [phone]  Physical Therapy Treatment  Patient Details  Name: Crystal Bass MRN: [id number] Date of Birth: 09/21/1941 Referring Provider: Ludwig Clarks    Encounter Date: 01/08/2018  PT End of Session - 01/08/18 1416    Visit Number  7    Number of Visits  17    Date for PT Re-Evaluation  01/29/18    PT Start Time  0200    PT Stop Time  0300    PT Time Calculation (min)  60 min    Equipment Utilized During Treatment  Gait belt    Activity Tolerance  Patient tolerated treatment well;Patient limited by fatigue    Behavior During Therapy  [facility] for tasks assessed/performed       Past Medical History:  Diagnosis Date  . 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 Aortic root aneurysm ([place])   . Breast cancer ([place]) 2002   infiltrative ductal carcinoma   . COPD (chronic obstructive pulmonary disease) ([place]) 9/08  . Ductal carcinoma of breast, estrogen receptor positive, stage 1 ([place]) 09/16/2012  . Endometriosis   . Hypercalcemia 09/14/2013  . Lesion of breast 1992   right, benign  . Lung disease    secondary to [name] infection  . Mastalgia 7/95  . Osteoporosis, post-menopausal 03/14/2012  . Ovarian cancer ([place]) 2004  . Pancreatitis    secondary to Cholelithiasis  . Parkinson disease ([place]) 02/10/15  . Post-thoracotomy pain syndrome   . Pseudomonas pneumonia ([place]) 5/09  . Uterine fibroid   . Vitamin D deficiency     Past Surgical History:  Procedure Laterality Date  . 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 APPENDECTOMY  2004  . BREAST BIOPSY  08/2004   right breast-benign  . BREAST LUMPECTOMY  1992   benign  . CARDIAC CATHETERIZATION  2007   essentially negation for significant CAD  . CHOLECYSTECTOMY  2001  . COLONOSCOPY  2014   Dr [name] , due 2019  . LUNG  BIOPSY  2002   [name], Dr [name]  . MASTECTOMY MODIFIED RADICAL Left 2002   oral chemotheraphy (tamoxifen then Armidex) no radiation, Dr.[name]  . TONSILLECTOMY  1946    There were no vitals filed for this visit.  Subjective Assessment - 01/08/18 1415    Subjective  Patient reports no pain but is having difficulty with her breathing.     Limitations  Standing;Walking;Writing    Currently in Pain?  No/denies    Pain Score  0-No pain    Pain Onset  Today    Multiple Pain Sites  No       Patient seen for LSVT Daily Session Maximal Daily Exercises for facilitation/coordination of movement Sustained movements are designed to rescale the amplitude of movement output for generalization to daily functional activities .Performed as follows for 1 set of 10 repetitions each multidirectional sustained movements  1) Floor to ceiling , cues to reach fwd more 2) Side to side multidirectional Repetitive movements performed in standing and are designed to provide retraining effort needed for sustained muscle activation in tasks   3) Step and reach forward , cue to finish the exercise 4) Step and reach backwards , cues to bring both UE back and lift toe 5) Step and reach sideways,  needs cues to turn his head  6) Rock and reach forward/backward , cues to keep feet from moving fwd 7) Rock and reach sideways, cues to twist big VC for all above exercises and demonstration for rock backwards step and for twist exercise.   Sit to stand functional component task with supervision 5 reps and simulated activities for: 1.Sit to stand functional component task with supervision 5 reps and simulated activities for: 2.hand coordination tasks such as buttons, writing 3.Hand strengthening tasks 4. dynamic stand balance practice such as ; toe tapping on blue foam 5.balance practice to be able to put on her pants in standing   Patient continues to demonstrates less incoordination of movement with select  exercises such as rock and reach and stepping backwards. Patient responds well to verbal and tactile cues to correct form and technique. Patient is able to catch mistakes in technique with incorrect positions and is able remember the start and finish positions. Motor control of LE much improved.                       PT Education - 01/08/18 1415    Education provided  Yes    Education Details  LSVT BIG    Person(s) Educated  Patient    Methods  Explanation;Demonstration    Comprehension  Verbalized understanding;Returned demonstration          PT Long Term Goals - 12/26/17 0828      PT LONG TERM GOAL #1   Title  Patient will be able to stand with unilateral upper extremity support for > 30  minutes in order to independently perform ADL's.    Time  4    Period  Weeks    Status  New    Target Date  01/22/18      PT LONG TERM GOAL #2   Title  Patient will be able to reach > 12  inches without loss of balance to reduce fall risk with functional activities.    Time  4    Period  Weeks    Status  New    Target Date  01/22/18      PT LONG TERM GOAL #3   Title  Pt will increase 6 minute walk time by 150 feet to improve endurance and aid in improving mobility    Time  4    Period  Weeks    Status  New    Target Date  01/22/18      PT LONG TERM GOAL #4   Title  Patient will be independent with home exercise program for self-management of ankle symptoms/ difficulty.    Time  4    Period  Weeks    Status  New    Target Date  01/22/18            Plan - 01/08/18 1417    Clinical Impression Statement  Continues to have balance deficits typical with diagnosis. Patient performs intermediate level exercises without pain behaviors and needs verbal cuing for postural alignment and head positioning. Cuing is needed to stretch the leg out in seated side reaching. Fatigue with sit to stand but demonstrating more control     Rehab Potential  Good    PT Frequency  4x  / week    PT Duration  4 weeks    PT Treatment/Interventions  Therapeutic activities;Therapeutic exercise;Gait training;Balance training;Neuromuscular re-education    PT Next Visit Plan  LSBT BIG  PT Home Exercise Plan  LSVT BIG    Consulted and Agree with Plan of Care  Patient       Patient will benefit from skilled therapeutic intervention in order to improve the following deficits and impairments:  Decreased balance, Decreased endurance, Decreased mobility, Difficulty walking, Decreased coordination, Decreased activity tolerance  Visit Diagnosis: Parkinson disease ([place])  Difficulty walking  Muscle weakness (generalized)  Other lack of coordination     Problem List Patient Active Problem List   Diagnosis Date Noted  . Chronic fatigue 10/23/2017  . Lesion of left lung 10/23/2017  . Genetic testing 05/01/2017  . Well woman exam 12/27/2016  . Parkinson disease ([place]) 01/10/2016  . DNR (do not resuscitate) 05/22/2015  . Tremor 02/07/2015  . Dysphagia, pharyngoesophageal phase 02/07/2015  . Hypercalcemia 09/14/2013  . Unspecified vitamin D deficiency 04/27/2013  . Breast cancer of upper-outer quadrant of left female breast ([place]) 09/16/2012  . Osteoporosis, post-menopausal 03/14/2012  . Aortic aneurysm ([place]) 01/01/2011  . OSTEOARTHRITIS, CERVICAL SPINE 08/15/2009  . HYPERGLYCEMIA, FASTING 12/06/2008  . ALLERGIC RHINITIS 03/25/2008  . Chronic obstructive airway disease with asthma ([place]) 03/25/2008  . BREAST CANCER, HX OF 03/10/2008  . Mixed hyperlipidemia 02/10/2007  . Essential hypertension 02/10/2007    [name] S,PT DPT 01/08/2018, 2:18 PM  [place] MAIN [facility] SERVICES [address] [place], [place], [address] Phone: [phone]   Fax:  [phone]  Name: Crystal Bass MRN: [id number] Date of Birth: 02-Jan-1941

## 2018-01-09 ENCOUNTER — Ambulatory Visit: Payer: Medicare Other | Admitting: Physical Therapy

## 2018-01-12 ENCOUNTER — Other Ambulatory Visit: Payer: Self-pay | Admitting: Family Medicine

## 2018-01-12 DIAGNOSIS — E538 Deficiency of other specified B group vitamins: Secondary | ICD-10-CM

## 2018-01-12 DIAGNOSIS — M810 Age-related osteoporosis without current pathological fracture: Secondary | ICD-10-CM

## 2018-01-12 DIAGNOSIS — R7309 Other abnormal glucose: Secondary | ICD-10-CM

## 2018-01-12 DIAGNOSIS — E782 Mixed hyperlipidemia: Secondary | ICD-10-CM

## 2018-01-13 ENCOUNTER — Ambulatory Visit: Payer: Medicare Other | Admitting: Physical Therapy

## 2018-01-13 ENCOUNTER — Encounter: Payer: Self-pay | Admitting: Physical Therapy

## 2018-01-13 DIAGNOSIS — G20 Parkinson's disease: Secondary | ICD-10-CM

## 2018-01-13 DIAGNOSIS — R460 Very low level of personal hygiene: Secondary | ICD-10-CM

## 2018-01-13 DIAGNOSIS — Z741 Need for assistance with personal care: Secondary | ICD-10-CM

## 2018-01-13 DIAGNOSIS — R278 Other lack of coordination: Secondary | ICD-10-CM

## 2018-01-13 DIAGNOSIS — M6281 Muscle weakness (generalized): Secondary | ICD-10-CM

## 2018-01-13 DIAGNOSIS — R262 Difficulty in walking, not elsewhere classified: Secondary | ICD-10-CM

## 2018-01-13 NOTE — Therapy (Signed)
[name] [facility] SERVICES [address] [place], [place], [address] Phone: [phone]   Fax:  [phone]  Physical Therapy Treatment  Patient Details  Name: Crystal Bass MRN: [id number] Date of Birth: 1941/04/25 Referring Provider: Ludwig Clarks    Encounter Date: 01/13/2018  PT End of Session - 01/13/18 1409    Visit Number  8    Number of Visits  17    Date for PT Re-Evaluation  01/29/18    PT Start Time  0200    PT Stop Time  0300    PT Time Calculation (min)  60 min    Equipment Utilized During Treatment  Gait belt    Activity Tolerance  Patient tolerated treatment well;Patient limited by fatigue    Behavior During Therapy  [facility] for tasks assessed/performed       Past Medical History:  Diagnosis Date  . 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 Aortic root aneurysm ([place])   . Breast cancer ([place]) 2002   infiltrative ductal carcinoma   . COPD (chronic obstructive pulmonary disease) ([place]) 9/08  . Ductal carcinoma of breast, estrogen receptor positive, stage 1 ([place]) 09/16/2012  . Endometriosis   . Hypercalcemia 09/14/2013  . Lesion of breast 1992   right, benign  . Lung disease    secondary to [name] infection  . Mastalgia 7/95  . Osteoporosis, post-menopausal 03/14/2012  . Ovarian cancer ([place]) 2004  . Pancreatitis    secondary to Cholelithiasis  . Parkinson disease ([place]) 02/10/15  . Post-thoracotomy pain syndrome   . Pseudomonas pneumonia ([place]) 5/09  . Uterine fibroid   . Vitamin D deficiency     Past Surgical History:  Procedure Laterality Date  . 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 APPENDECTOMY  2004  . BREAST BIOPSY  08/2004   right breast-benign  . BREAST LUMPECTOMY  1992   benign  . CARDIAC CATHETERIZATION  2007   essentially negation for significant CAD  . CHOLECYSTECTOMY  2001  . COLONOSCOPY  2014   Dr [name] , due 2019  . LUNG  BIOPSY  2002   [name], Dr [name]  . MASTECTOMY MODIFIED RADICAL Left 2002   oral chemotheraphy (tamoxifen then Armidex) no radiation, Dr.[name]  . TONSILLECTOMY  1946    There were no vitals filed for this visit.  Subjective Assessment - 01/13/18 1409    Subjective  Patient reports that she is not having such a great day but nothing else said about why.     Limitations  Standing;Walking;Writing    Currently in Pain?  No/denies    Pain Score  0-No pain    Pain Onset  Today       Patient seen for LSVT Daily Session Maximal Daily Exercises for facilitation/coordination of movement Sustained movements are designed to rescale the amplitude of movement output for generalization to daily functional activities .Performed as follows for 1 set of 10 repetitions each multidirectional sustained movements  1) Floor to ceiling, cues to reach fwd more 2) Side to side multidirectional Repetitive movements performed in standing and are designed to provide retraining effort needed for sustained muscle activation in tasks  3) Step and reach forward , cue to finish the exercise 4) Step and reach backwards, cues to bring both UE back and lift toe, has difficulty with switching feet 5) Step and reach sideways,  needs cues to turn his head 6) Rock and reach forward/backward , cues to keep feet from moving fwd 7) Rock and reach sideways, cues to twist big VC for all above exercises and demonstration for rock backwards step and for twist exercise.  Sit to stand functional component task with supervision 5 reps and simulated activities for: 1.Sit to stand functional component task with supervision 5 reps and simulated activities for: 2.hand coordination tasks such as buttons, writing 3.Hand strengthening tasks 4. dynamic stand balance practice such as ; toe tapping on blue foam 5.balance practice to be able to put on her pants in standing    Patients performance improves with practice and  uses  UE 10% to help support and for balance; frequent rests for SOB.                            PT Long Term Goals - 12/26/17 4742      PT LONG TERM GOAL #1   Title  Patient will be able to stand with unilateral upper extremity support for > 30  minutes in order to independently perform ADL's.    Time  4    Period  Weeks    Status  New    Target Date  01/22/18      PT LONG TERM GOAL #2   Title  Patient will be able to reach > 12  inches without loss of balance to reduce fall risk with functional activities.    Time  4    Period  Weeks    Status  New    Target Date  01/22/18      PT LONG TERM GOAL #3   Title  Pt will increase 6 minute walk time by 150 feet to improve endurance and aid in improving mobility    Time  4    Period  Weeks    Status  New    Target Date  01/22/18      PT LONG TERM GOAL #4   Title  Patient will be independent with home exercise program for self-management of ankle symptoms/ difficulty.    Time  4    Period  Weeks    Status  New    Target Date  01/22/18            Plan - 01/13/18 1410    Clinical Impression Statement  Patient has better stepping pattern with less stopping and better posture, Patient has difficulty with finishing BIG and needs extra cuing to perform exercises with correct amplitude and speed. Patient has difficulty with turning  head and rotating  trunk with weight shifting exercises. Patient has fatigue with standing exercises and needs constant VC to have correct posture. Patient has loss of balance and needs UE support intermittently thorough out exercise. Patient has slowness of movement during rotation and beginning movements.    Rehab Potential  Good    PT Frequency  4x / week    PT Duration  4 weeks    PT Treatment/Interventions  Therapeutic activities;Therapeutic exercise;Gait training;Balance training;Neuromuscular re-education    PT Next Visit Plan  LSBT BIG    PT Home Exercise Plan  LSVT BIG     Consulted and Agree with Plan of Care  Patient       Patient will benefit from skilled therapeutic intervention in order to improve the following deficits and impairments:  Decreased balance, Decreased endurance, Decreased mobility, Difficulty walking, Decreased  coordination, Decreased activity tolerance  Visit Diagnosis: Parkinson disease ([place])  Difficulty walking  Muscle weakness (generalized)  Other lack of coordination  Self-care deficit for hygiene     Problem List Patient Active Problem List   Diagnosis Date Noted  . Chronic fatigue 10/23/2017  . Lesion of left lung 10/23/2017  . Genetic testing 05/01/2017  . Well woman exam 12/27/2016  . Parkinson disease ([place]) 01/10/2016  . DNR (do not resuscitate) 05/22/2015  . Tremor 02/07/2015  . Dysphagia, pharyngoesophageal phase 02/07/2015  . Hypercalcemia 09/14/2013  . Unspecified vitamin D deficiency 04/27/2013  . Breast cancer of upper-outer quadrant of left female breast ([place]) 09/16/2012  . Osteoporosis, post-menopausal 03/14/2012  . Aortic aneurysm ([place]) 01/01/2011  . OSTEOARTHRITIS, CERVICAL SPINE 08/15/2009  . HYPERGLYCEMIA, FASTING 12/06/2008  . ALLERGIC RHINITIS 03/25/2008  . Chronic obstructive airway disease with asthma ([place]) 03/25/2008  . BREAST CANCER, HX OF 03/10/2008  . Mixed hyperlipidemia 02/10/2007  . Essential hypertension 02/10/2007    Alanson Puls, [place] DPT 01/13/2018, 2:13 PM  [place] MAIN [facility] SERVICES [place], [place], [address] Phone: [phone]   Fax:  [phone]  Name: OSMARA DRUMMONDS MRN: [id number] Date of Birth: [birth year]

## 2018-01-14 ENCOUNTER — Encounter: Payer: Self-pay | Admitting: Physical Therapy

## 2018-01-14 ENCOUNTER — Ambulatory Visit: Payer: Medicare Other | Admitting: Physical Therapy

## 2018-01-14 ENCOUNTER — Ambulatory Visit (INDEPENDENT_AMBULATORY_CARE_PROVIDER_SITE_OTHER): Payer: Medicare Other

## 2018-01-14 VITALS — BP 130/90 | HR 64 | Temp 97.5°F | Ht 62.5 in | Wt 147.2 lb

## 2018-01-14 DIAGNOSIS — E782 Mixed hyperlipidemia: Secondary | ICD-10-CM

## 2018-01-14 DIAGNOSIS — M6281 Muscle weakness (generalized): Secondary | ICD-10-CM

## 2018-01-14 DIAGNOSIS — R7309 Other abnormal glucose: Secondary | ICD-10-CM | POA: Diagnosis not present

## 2018-01-14 DIAGNOSIS — M810 Age-related osteoporosis without current pathological fracture: Secondary | ICD-10-CM | POA: Diagnosis not present

## 2018-01-14 DIAGNOSIS — Z0000 Encounter for general adult medical examination without abnormal findings: Secondary | ICD-10-CM | POA: Diagnosis not present

## 2018-01-14 DIAGNOSIS — E538 Deficiency of other specified B group vitamins: Secondary | ICD-10-CM

## 2018-01-14 DIAGNOSIS — R278 Other lack of coordination: Secondary | ICD-10-CM

## 2018-01-14 DIAGNOSIS — G20 Parkinson's disease: Secondary | ICD-10-CM

## 2018-01-14 DIAGNOSIS — R7989 Other specified abnormal findings of blood chemistry: Secondary | ICD-10-CM | POA: Diagnosis not present

## 2018-01-14 DIAGNOSIS — R262 Difficulty in walking, not elsewhere classified: Secondary | ICD-10-CM

## 2018-01-14 LAB — BASIC METABOLIC PANEL
BUN: 15 mg/dL (ref 6–23)
CHLORIDE: 103 meq/L (ref 96–112)
CO2: 29 mEq/L (ref 19–32)
CREATININE: 0.77 mg/dL (ref 0.40–1.20)
Calcium: 9.3 mg/dL (ref 8.4–10.5)
GFR: 77.71 mL/min (ref >60.00)
Glucose, Bld: 91 mg/dL (ref 70–99)
POTASSIUM: 4.5 meq/L (ref 3.5–5.1)
Sodium: 136 mEq/L (ref 135–145)

## 2018-01-14 LAB — LIPID PANEL
CHOLESTEROL: 172 mg/dL (ref 0–200)
HDL: 44.9 mg/dL (ref >39.00)
LDL Cholesterol: 95 mg/dL (ref 0–99)
NONHDL: 127.13
Total CHOL/HDL Ratio: 4
Triglycerides: 161 mg/dL — ABNORMAL HIGH (ref 0.0–149.0)
VLDL: 32.2 mg/dL (ref 0.0–40.0)

## 2018-01-14 LAB — VITAMIN B12: VITAMIN B 12: 239 pg/mL (ref 211–911)

## 2018-01-14 LAB — VITAMIN D 25 HYDROXY (VIT D DEFICIENCY, FRACTURES): VITD: 51.18 ng/mL (ref 30.00–100.00)

## 2018-01-14 LAB — HEMOGLOBIN A1C: HEMOGLOBIN A1C: 5.3 % (ref 4.6–6.5)

## 2018-01-14 NOTE — Progress Notes (Signed)
Subjective:   Crystal Bass is a 77 y.o. female who presents for Medicare Annual (Subsequent) preventive examination.  Review of Systems:  N/A Cardiac Risk Factors include: advanced age (>74men, >83 women);dyslipidemia;hypertension     Objective:     Vitals: BP 130/90 (BP Location: Right Arm, Patient Position: Sitting, Cuff Size: Normal)   Pulse 64   Temp (!) 97.5 F (36.4 C) (Oral)   Ht 5' 2.5" (1.588 m) Comment: no shoes  Wt 147 lb 4 oz (66.8 kg)   SpO2 94%   BMI 26.50 kg/m   Body mass index is 26.5 kg/m.  Advanced Directives 01/14/2018 12/25/2017 12/27/2016 03/28/2016 03/15/2016 01/03/2016 04/19/2015  Does Patient Have a Medical Advance Directive? Yes Yes Yes Yes Yes Yes Yes  Type of Paramedic of [place];Living will - [place];Living will [place];Living will [place];Living will [place];Living will [place];Living will  Does patient want to make changes to medical advance directive? - - - - - No - Patient declined -  Copy of [place] in Chart? Yes - Yes - - No - copy requested -    Tobacco Social History   Tobacco Use  Smoking Status Never Smoker  Smokeless Tobacco Never Used     Counseling given: No   Clinical Intake:  Pre-visit preparation completed: Yes  Pain : No/denies pain Pain Score: 0-No pain     Nutritional Status: BMI 25 -29 Overweight Nutritional Risks: Other (Comment) Diabetes: No  How often do you need to have someone help you when you read instructions, pamphlets, or other written materials from your doctor or pharmacy?: 1 - Never What is the last grade level you completed in school?: Post-Graduate  Interpreter Needed?: No  Comments: pt is a widow and lives at [place] entered by :: LPinson, 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 Aneurysm of aorta (HCC)    Aortic Root  Aneurysm 4 cm on CT 2011  . 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 Breast cancer ([place]) 2002   infiltrative ductal carcinoma   . Cataract 2019  . COPD (chronic obstructive pulmonary disease) ([place]) 9/08  . Ductal carcinoma of breast, estrogen receptor positive, stage 1 ([place]) 09/16/2012  . Endometriosis   . Hypercalcemia 09/14/2013  . Hypertension   . Lesion of breast 1992   right, benign  . Lung disease    secondary to [name] infection  . Mastalgia 7/95  . Osteoporosis, post-menopausal 03/14/2012  . Ovarian cancer ([place]) 2004  . Pancreatitis    secondary to Cholelithiasis  . Parkinson disease ([place]) 02/10/15  . Post-thoracotomy pain syndrome   . Pseudomonas pneumonia ([place]) 5/09  . Uterine fibroid   . Vitamin D deficiency    Past Surgical History:  Procedure Laterality Date  . 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 APPENDECTOMY  2004  . BREAST BIOPSY  08/2004   right breast-benign  . BREAST LUMPECTOMY  1992   benign  . CARDIAC CATHETERIZATION  2007   essentially negation for significant CAD  . CHOLECYSTECTOMY  2001  . COLONOSCOPY  2014   Dr [name] , due 2019  . LUNG BIOPSY  2002   [name], Dr [name]  . MASTECTOMY MODIFIED RADICAL Left 2002   oral chemotheraphy (tamoxifen then Armidex) no radiation, Dr.[name]  . TONSILLECTOMY  1946   Family  History  Problem Relation Age of Onset  . Lung cancer Father 57       d.82 history of smoking  . Breast cancer Sister 10  . Colon cancer Sister   . Emphysema Mother        d.64 was never a smoker  . Stroke Paternal Aunt        in mid 61s  . Osteoporosis Paternal Aunt   . Melanoma Maternal Aunt   . Cancer Maternal Uncle        unspecified type  . Cancer Maternal Grandmother        d.82s unspecified GI cancer  . Cancer Maternal Grandfather        d.62s unspecified type  . Liver cancer Maternal Aunt   . Cancer Maternal Uncle        unspecified type  . Cancer Maternal Uncle        unspecified  type  . Myasthenia gravis Paternal Aunt   . Breast cancer Cousin        d.60s-daughter of unaffected paternal aunt Eustace Moore  . Colon cancer Cousin 16       d.70-daughter of unaffected maternal aunt Leila  . Lung cancer Cousin 70       d.70-sisters to each other, both daughters of maternal uncle Johnny  . Diabetes Neg Hx   . Heart disease Neg Hx    Social History   Socioeconomic History  . Marital status: Widowed    Spouse name: None  . Number of children: 0  . Years of education: None  . Highest education level: None  Social Needs  . Financial resource strain: None  . Food insecurity - worry: None  . Food insecurity - inability: None  . Transportation needs - medical: None  . Transportation needs - non-medical: None  Occupational History  . 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 Smoking status: Never Smoker  . Smokeless tobacco: Never Used  Substance and Sexual Activity  . Alcohol use: No    Alcohol/week: 0.0 oz  . Drug use: No  . Sexual activity: No    Birth control/protection: Surgical    Comment: TAH/BSO  Other Topics Concern  . 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 acetaminophen (TYLENOL) 325 MG tablet Take 1,000 mg by mouth 2 (two) times daily as needed.   . Albuterol Sulfate ([name]) 161 (90 Base) MCG/ACT AEPB Inhale 2 puffs into the lungs every 6 (six) hours as needed (dyspnea, wheezing).  [name] aspirin 81 MG tablet Take 81 mg by mouth daily.    . carbidopa-levodopa ([name]) 25-100 MG tablet TAKE 2 TABLETS IN THE MORNING, 1 TABLET IN THE AFTERNOON, 1 IN THE EVENING  . clonazePAM (KLONOPIN) 0.5 MG tablet TAKE 0.5-1 TABLET BY MOUTH AT BEDTIME  . gabapentin ([name]) 300 MG capsule Take 2 capsules (600 mg total) by mouth 3 (three) times daily.  [name] losartan (COZAAR) 50 MG tablet TAKE 1  TABLET EVERY DAY  . metoprolol tartrate (LOPRESSOR) 25 MG tablet TAKE 1 TABLET BY MOUTH TWICE A DAY  . vitamin B-12 (CYANOCOBALAMIN) 1000 MCG tablet Take 1 tablet (1,000 mcg total) by mouth daily.  . Vitamin D, Ergocalciferol, ([name]) 50000 units CAPS capsule TAKE 1 CAPSULE BY MOUTH EVERY WEEK   No facility-administered encounter medications on file as  of 01/14/2018.     Activities of Daily Living In your present state of health, do you have any difficulty performing the following activities: 01/14/2018  Hearing? N  Vision? Y  Difficulty concentrating or making decisions? N  Walking or climbing stairs? Y  Dressing or bathing? N  Doing errands, shopping? N  Preparing Food and eating ? N  Using the Toilet? N  In the past six months, have you accidently leaked urine? N  Do you have problems with loss of bowel control? N  Managing your Medications? N  Managing your Finances? N  Housekeeping or managing your Housekeeping? N  Some recent data might be hidden    Patient Care Team: [name], MD as PCP - General (Family Medicine) Mosetta Anis, MD as Referring Physician (Allergy) Megan Salon, MD as Consulting Physician (Gynecology) Tat, Eustace Quail, DO as Consulting Physician (Neurology) Nicholas Lose, MD as Consulting Physician (Hematology and Oncology)    Assessment:   This is a routine wellness examination for [place].  Exercise Activities and Dietary recommendations Current Exercise Habits: Structured exercise class([employer] - physical therapy class), Type of exercise: calisthenics;stretching;walking, Time (Minutes): 60, Frequency (Times/Week): 5, Weekly Exercise (Minutes/Week): 300, Intensity: Mild, Exercise limited by: None identified  Goals    . Increase physical activity     Starting 01/14/2018, I will continue participating with [employer] program as well as walking for 60 minutes 5 days per week.        Fall Risk Fall Risk  01/14/2018 10/25/2017 06/12/2017  02/05/2017 12/27/2016  Falls in the past year? No No No No No   Depression Screen PHQ 2/9 Scores 01/14/2018 12/27/2016 02/07/2015 04/27/2013  PHQ - 2 Score 0 1 0 0  PHQ- 9 Score 0 - - -     Cognitive Function MMSE - Mini Mental State Exam 01/14/2018 06/12/2017 12/27/2016  Not completed: - Unable to complete -  Orientation to time 5 - 5  Orientation to Place 5 - 5  Registration 3 - 3  Attention/ Calculation 0 - 0  Recall 3 - 3  Language- name 2 objects 0 - 0  Language- repeat 1 - 1  Language- follow 3 step command 3 - 3  Language- read & follow direction 0 - 0  Write a sentence 0 - 0  Copy design 0 - 0  Total score 20 - 20   PLEASE NOTE: A Mini-Cog screen was completed. Maximum score is 20. A value of 0 denotes this part of [place] MMSE was not completed or the patient failed this part of the Mini-Cog screening.   Mini-Cog Screening Orientation to Time - Max 5 pts Orientation to Place - Max 5 pts Registration - Max 3 pts Recall - Max 3 pts Language Repeat - Max 1 pts Language Follow 3 Step Command - Max 3 pts   Montreal Cognitive Assessment  10/02/2016  Visuospatial/ Executive (0/5) 4  Naming (0/3) 3  Attention: Read list of digits (0/2) 2  Attention: Read list of letters (0/1) 1  Attention: Serial 7 subtraction starting at 100 (0/3) 3  Language: Repeat phrase (0/2) 2  Language : Fluency (0/1) 1  Abstraction (0/2) 2  Delayed Recall (0/5) 3  Orientation (0/6) 6  Total 27  Adjusted Score (based on education) 27     Immunization History  Administered Date(s) Administered  . Influenza Split 09/11/2011  . Influenza Whole 09/09/2007, 08/08/2008, 08/15/2010, 08/26/2012  . Influenza,inj,Quad PF,6+ Mos 08/18/2013, 08/20/2014  . Influenza-Unspecified 09/27/2015,  08/26/2017  . Pneumococcal Conjugate-13 02/12/2014  . Pneumococcal Polysaccharide-23 11/26/2006, 10/21/2012  . Td 01/01/2011  . Zoster 05/26/2007    Screening Tests Health Maintenance  Topic Date Due  . DTaP/Tdap/Td  (1 - Tdap) 01/01/2021 (Originally 01/02/2011)  . COLONOSCOPY  03/03/2018  . MAMMOGRAM  11/05/2018  . TETANUS/TDAP  01/01/2021  . INFLUENZA VACCINE  Completed  . DEXA SCAN  Completed  . PNA vac Low Risk Adult  Completed      Plan:     I have personally reviewed, addressed, and noted the following in the patient's chart:  A. Medical and social history B. Use of alcohol, tobacco or illicit drugs  C. Current medications and supplements D. Functional ability and status E.  Nutritional status F.  Physical activity G. Advance directives H. List of other physicians I.  Hospitalizations, surgeries, and ER visits in previous 12 months J.  [place] to include hearing, vision, cognitive, depression L. Referrals and appointments - none  In addition, I have reviewed and discussed with patient certain preventive protocols, quality metrics, and best practice recommendations. A written personalized care plan for preventive services as well as general preventive health recommendations were provided to patient.  See attached scanned questionnaire for additional information.   Signed,   Lindell Noe, MHA, BS, LPN Health Coach

## 2018-01-14 NOTE — Progress Notes (Signed)
Pre visit review using our clinic review tool, if applicable. No additional management support is needed unless otherwise documented below in the visit note. 

## 2018-01-14 NOTE — Progress Notes (Signed)
PCP notes:   Health maintenance:  No gaps identified.  Abnormal screenings:   None  Patient concerns:   None  Nurse concerns:  None   Next PCP appt:   01/21/18 @ 1130

## 2018-01-14 NOTE — Patient Instructions (Signed)
Crystal Bass , Thank you for taking time to come for your Medicare Wellness Visit. I appreciate your ongoing commitment to your health goals. Please review the following plan we discussed and let me know if I can assist you in the future.   These are the goals we discussed: Goals    . Increase physical activity     Starting 01/14/2018, I will continue participating with [employer] program as well as walking for 60 minutes 5 days per week.        This is a list of the screening recommended for you and due dates:  Health Maintenance  Topic Date Due  . DTaP/Tdap/Td vaccine (1 - Tdap) 01/01/2021*  . Colon Cancer Screening  03/03/2018  . Mammogram  11/05/2018  . Tetanus Vaccine  01/01/2021  . Flu Shot  Completed  . DEXA scan (bone density measurement)  Completed  . Pneumonia vaccines  Completed  *Topic was postponed. The date shown is not the original due date.   Preventive Care for Adults  A healthy lifestyle and preventive care can promote health and wellness. Preventive health guidelines for adults include the following key practices.  . A routine yearly physical is a good way to check with your health care provider about your health and preventive screening. It is a chance to share any concerns and updates on your health and to receive a thorough exam.  . Visit your dentist for a routine exam and preventive care every 6 months. Brush your teeth twice a day and floss once a day. Good oral hygiene prevents tooth decay and gum disease.  . The frequency of eye exams is based on your age, health, family medical history, use  of contact lenses, and other factors. Follow your health care provider's recommendations for frequency of eye exams.  . Eat a healthy diet. Foods like vegetables, fruits, whole grains, low-fat dairy products, and lean protein foods contain the nutrients you need without too many calories. Decrease your intake of foods high in solid fats, added sugars, and salt. Eat  the right amount of calories for you. Get information about a proper diet from your health care provider, if necessary.  . Regular physical exercise is one of the most important things you can do for your health. Most adults should get at least 150 minutes of moderate-intensity exercise (any activity that increases your heart rate and causes you to sweat) each week. In addition, most adults need muscle-strengthening exercises on 2 or more days a week.  Silver Sneakers may be a benefit available to you. To determine eligibility, you may visit the website: www.silversneakers.com or contact program at (224) 085-2384 Mon-Fri between 8AM-8PM.   . Maintain a healthy weight. The body mass index (BMI) is a screening tool to identify possible weight problems. It provides an estimate of body fat based on height and weight. Your health care provider can find your BMI and can help you achieve or maintain a healthy weight.   For adults 20 years and older: ? A BMI below 18.5 is considered underweight. ? A BMI of 18.5 to 24.9 is normal. ? A BMI of 25 to 29.9 is considered overweight. ? A BMI of 30 and above is considered obese.   . Maintain normal blood lipids and cholesterol levels by exercising and minimizing your intake of saturated fat. Eat a balanced diet with plenty of fruit and vegetables. Blood tests for lipids and cholesterol should begin at age 105 and be repeated every 5  years. If your lipid or cholesterol levels are high, you are over 50, or you are at high risk for heart disease, you may need your cholesterol levels checked more frequently. Ongoing high lipid and cholesterol levels should be treated with medicines if diet and exercise are not working.  . If you smoke, find out from your health care provider how to quit. If you do not use tobacco, please do not start.  . If you choose to drink alcohol, please do not consume more than 2 drinks per day. One drink is considered to be 12 ounces (355 mL)  of beer, 5 ounces (148 mL) of wine, or 1.5 ounces (44 mL) of liquor.  . If you are 75-64 years old, ask your health care provider if you should take aspirin to prevent strokes.  . Use sunscreen. Apply sunscreen liberally and repeatedly throughout the day. You should seek shade when your shadow is shorter than you. Protect yourself by wearing long sleeves, pants, a wide-brimmed hat, and sunglasses year round, whenever you are outdoors.  . Once a month, do a whole body skin exam, using a mirror to look at the skin on your back. Tell your health care provider of new moles, moles that have irregular borders, moles that are larger than a pencil eraser, or moles that have changed in shape or color.

## 2018-01-14 NOTE — Therapy (Signed)
[place] MAIN [facility] SERVICES [address] [place], [place], [address] Phone: [phone]   Fax:  [phone]  Physical Therapy Treatment  Patient Details  Name: Crystal Bass MRN: [id number] Date of Birth: 10/23/1941 Referring Provider: Ludwig Clarks    Encounter Date: 01/14/2018  PT End of Session - 01/14/18 1445    Visit Number  9    Number of Visits  17    Date for PT Re-Evaluation  01/29/18    PT Start Time  0200    PT Stop Time  0300    PT Time Calculation (min)  60 min    Equipment Utilized During Treatment  Gait belt    Activity Tolerance  Patient tolerated treatment well;Patient limited by fatigue    Behavior During Therapy  [facility] for tasks assessed/performed       Past Medical History:  Diagnosis Date  . 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 Breast cancer ([place]) 2002   infiltrative ductal carcinoma   . Cataract 2019  . COPD (chronic obstructive pulmonary disease) ([place]) 9/08  . Ductal carcinoma of breast, estrogen receptor positive, stage 1 ([place]) 09/16/2012  . Endometriosis   . Hypercalcemia 09/14/2013  . Hypertension   . Lesion of breast 1992   right, benign  . Lung disease    secondary to [name] infection  . Mastalgia 7/95  . Osteoporosis, post-menopausal 03/14/2012  . Ovarian cancer ([place]) 2004  . Pancreatitis    secondary to Cholelithiasis  . Parkinson disease ([place]) 02/10/15  . Post-thoracotomy pain syndrome   . Pseudomonas pneumonia ([place]) 5/09  . Uterine fibroid   . Vitamin D deficiency     Past Surgical History:  Procedure Laterality Date  . 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 APPENDECTOMY  2004  . BREAST BIOPSY  08/2004   right breast-benign  . BREAST LUMPECTOMY  1992   benign  . CARDIAC CATHETERIZATION  2007   essentially negation for significant CAD  . CHOLECYSTECTOMY  2001  . COLONOSCOPY   2014   Dr [name] , due 2019  . LUNG BIOPSY  2002   [name], Dr [name]  . MASTECTOMY MODIFIED RADICAL Left 2002   oral chemotheraphy (tamoxifen then Armidex) no radiation, Dr.[name]  . TONSILLECTOMY  1946    There were no vitals filed for this visit.  Subjective Assessment - 01/14/18 1441    Subjective  Patient reports that she is not having such a great day again , she is not able to sleep well.    Limitations  Standing;Walking;Writing    Currently in Pain?  No/denies    Pain Score  0-No pain    Pain Onset  Today       Patient seen for LSVT Daily Session Maximal Daily Exercises for facilitation/coordination of movement Sustained movements are designed to rescale the amplitude of movement output for generalization to daily functional activities .Performed as follows for 1 set of 10 repetitions each multidirectional sustained movements  1) Floor to ceiling, cues to reach fwd more 2) Side to side multidirectional Repetitive movements performed in standing and are designed to provide retraining effort needed for sustained muscle activation in tasks  3) Step and reach forward , cue to finish the exercise 4) Step and reach backwards, cues to bring both UE back and lift toe,  has difficulty with switching feet 5) Step and reach sideways, needs cues to turn his head 6) Rock and reach forward/backward , cues to keep feet from moving fwd 7) Rock and reach sideways, cues to twist big VC for all above exercises and demonstration for rock backwards step and for twist exercise.  Sit to stand functional component task with supervision 5 reps and simulated activities for: 1.Sit to stand functional component task with supervision 5 reps and simulated activities for: 2.hand coordination tasks such as buttons, writing 3.Hand strengthening tasks 4. dynamic stand balance practice such as ; toe tapping on blue foam 5.balance practice to be able to put on her pants in standing  Patients  performance improves with practice and  uses UE 10% to help support and for balance; frequent rests for SOB Patient has unsteady stepping with several exercises but motor control improve with practice. Patient needs occasional verbal cueing to improve posture and cueing to correctly perform exercises slowly, holding at end of range to increase motor firing of desired muscle to encourage fatigue                        PT Education - 01/14/18 1445    Education provided  Yes    Education Details  LSVT BIG    Person(s) Educated  Patient    Methods  Explanation;Demonstration    Comprehension  Verbalized understanding;Returned demonstration          PT Long Term Goals - 12/26/17 0828      PT LONG TERM GOAL #1   Title  Patient will be able to stand with unilateral upper extremity support for > 30  minutes in order to independently perform ADL's.    Time  4    Period  Weeks    Status  New    Target Date  01/22/18      PT LONG TERM GOAL #2   Title  Patient will be able to reach > 12  inches without loss of balance to reduce fall risk with functional activities.    Time  4    Period  Weeks    Status  New    Target Date  01/22/18      PT LONG TERM GOAL #3   Title  Pt will increase 6 minute walk time by 150 feet to improve endurance and aid in improving mobility    Time  4    Period  Weeks    Status  New    Target Date  01/22/18      PT LONG TERM GOAL #4   Title  Patient will be independent with home exercise program for self-management of ankle symptoms/ difficulty.    Time  4    Period  Weeks    Status  New    Target Date  01/22/18            Plan - 01/14/18 1445    Clinical Impression Statement  Patient has unsteady stepping with several exercises but motor control improve with practice. Patient needs occasional verbal cueing to improve posture and cueing to correctly perform exercises slowly, holding at end of range to increase motor firing of  desired muscle to encourage fatigue    Rehab Potential  Good    PT Frequency  4x / week    PT Duration  4 weeks    PT Treatment/Interventions  Therapeutic activities;Therapeutic exercise;Gait training;Balance training;Neuromuscular re-education    PT Next  Visit Plan  LSVT BIG    PT Home Exercise Plan  LSVT BIG    Consulted and Agree with Plan of Care  Patient       Patient will benefit from skilled therapeutic intervention in order to improve the following deficits and impairments:  Decreased balance, Decreased endurance, Decreased mobility, Difficulty walking, Decreased coordination, Decreased activity tolerance  Visit Diagnosis: Parkinson disease ([place])  Difficulty walking  Muscle weakness (generalized)  Other lack of coordination     Problem List Patient Active Problem List   Diagnosis Date Noted  . Chronic fatigue 10/23/2017  . Lesion of left lung 10/23/2017  . Genetic testing 05/01/2017  . Well woman exam 12/27/2016  . Parkinson disease ([place]) 01/10/2016  . DNR (do not resuscitate) 05/22/2015  . Tremor 02/07/2015  . Dysphagia, pharyngoesophageal phase 02/07/2015  . Hypercalcemia 09/14/2013  . Unspecified vitamin D deficiency 04/27/2013  . Breast cancer of upper-outer quadrant of left female breast ([place]) 09/16/2012  . Osteoporosis, post-menopausal 03/14/2012  . Aortic aneurysm ([place]) 01/01/2011  . OSTEOARTHRITIS, CERVICAL SPINE 08/15/2009  . HYPERGLYCEMIA, FASTING 12/06/2008  . ALLERGIC RHINITIS 03/25/2008  . Chronic obstructive airway disease with asthma ([place]) 03/25/2008  . BREAST CANCER, HX OF 03/10/2008  . Mixed hyperlipidemia 02/10/2007  . Essential hypertension 02/10/2007    Alanson Puls, [place] DPT 01/14/2018, 2:47 PM  [place] MAIN [facility] SERVICES [address] [place], [place], [address] Phone: [phone]   Fax:  [phone]  Name: Crystal Bass MRN: [id number] Date of Birth: 12-06-1940

## 2018-01-15 ENCOUNTER — Ambulatory Visit: Payer: Medicare Other | Admitting: Physical Therapy

## 2018-01-15 DIAGNOSIS — G20 Parkinson's disease: Secondary | ICD-10-CM | POA: Diagnosis not present

## 2018-01-15 DIAGNOSIS — R460 Very low level of personal hygiene: Secondary | ICD-10-CM

## 2018-01-15 DIAGNOSIS — R278 Other lack of coordination: Secondary | ICD-10-CM

## 2018-01-15 DIAGNOSIS — M6281 Muscle weakness (generalized): Secondary | ICD-10-CM

## 2018-01-15 DIAGNOSIS — Z741 Need for assistance with personal care: Secondary | ICD-10-CM

## 2018-01-15 DIAGNOSIS — R262 Difficulty in walking, not elsewhere classified: Secondary | ICD-10-CM

## 2018-01-15 NOTE — Therapy (Signed)
[place] MAIN [facility] SERVICES [address] [place], [place], [address] Phone: [phone]   Fax:  [phone]  Physical Therapy Treatment  Patient Details  Name: Crystal Bass MRN: [id number] Date of Birth: [birth year] Referring Provider: Ludwig Clarks    Encounter Date: 01/15/2018  PT End of Session - 01/14/18 1445    Visit Number  9    Number of Visits  17    Date for PT Re-Evaluation  01/29/18    PT Start Time  0200    PT Stop Time  0300    PT Time Calculation (min)  60 min    Equipment Utilized During Treatment  Gait belt    Activity Tolerance  Patient tolerated treatment well;Patient limited by fatigue    Behavior During Therapy  [facility] for tasks assessed/performed       Past Medical History:  Diagnosis Date  . 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 Aortic root aneurysm ([place])   . Breast cancer ([place]) 2002   infiltrative ductal carcinoma   . Cataract 2019  . COPD (chronic obstructive pulmonary disease) ([place]) 9/08  . Ductal carcinoma of breast, estrogen receptor positive, stage 1 ([place]) 09/16/2012  . Endometriosis   . Hypercalcemia 09/14/2013  . Hypertension   . Lesion of breast 1992   right, benign  . Lung disease    secondary to [name] infection  . Mastalgia 7/95  . Osteoporosis, post-menopausal 03/14/2012  . Ovarian cancer ([place]) 2004  . Pancreatitis    secondary to Cholelithiasis  . Parkinson disease ([place]) 02/10/15  . Post-thoracotomy pain syndrome   . Pseudomonas pneumonia ([place]) 5/09  . Uterine fibroid   . Vitamin D deficiency     Past Surgical History:  Procedure Laterality Date  . 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 APPENDECTOMY  2004  . BREAST BIOPSY  08/2004   right breast-benign  . BREAST LUMPECTOMY  1992   benign  . CARDIAC CATHETERIZATION  2007   essentially negation for significant CAD  . CHOLECYSTECTOMY  2001  . COLONOSCOPY   2014   Dr [name] , due 2019  . LUNG BIOPSY  2002   [name], Dr [name]  . MASTECTOMY MODIFIED RADICAL Left 2002   oral chemotheraphy (tamoxifen then Armidex) no radiation, Dr.[name]  . TONSILLECTOMY  1946    There were no vitals filed for this visit.  Subjective Assessment - 01/14/18 1441    Subjective  Patient reports that she is not having such a great day again , she is not able to sleep well.    Limitations  Standing;Walking;Writing    Currently in Pain?  No/denies    Pain Score  0-No pain    Pain Onset  Today      eval results OUTCOME MEASURES: TEST Outcome Interpretation  5 times sit<>stand 12.75sec >75 yo, >15 sec indicates increased risk for falls  10 meter walk test   1.11              m/s <1.0 m/s indicates increased risk for falls; limited community ambulator  Timed up and Go 6.52                sec <14 sec indicates increased risk for falls  6 minute walk test      1045          Feet 1000  feet is community ambulator      [address] Peg Test L:20.57               R: 20.96    Mid-term   OUTCOME MEASURES: TEST Outcome Interpretation  5 times sit<>stand 8.53 sec >75 yo, >15 sec indicates increased risk for falls  10 meter walk test   1.50            m/s <1.0 m/s indicates increased risk for falls; limited community ambulator  Timed up and Go 5.76              sec <14 sec indicates increased risk for falls  6 minute walk test      1450         Feet 1000 feet is community ambulator      [address] Peg Test L:19.44           R: 17.81  < 20 sec is [facility]    All outcome measures improved    Patient seen for LSVT Daily Session Maximal Daily Exercises for facilitation/coordination of movement Sustained movements are designed to rescale the amplitude of movement output for generalization to daily functional activities .Performed as follows for 1 set of 10 repetitions each multidirectional sustained movements  1) Floor to ceiling, cues to reach fwd more 2) Side to side  multidirectional Repetitive movements performed in standing and are designed to provide retraining effort needed for sustained muscle activation in tasks  3) Step and reach forward , cue to finish the exercise 4) Step and reach backwards, cues to bring both UE back and lift toe, has difficulty with switching feet 5) Step and reach sideways, needs cues to turn his head 6) Rock and reach forward/backward , cues to keep feet from moving fwd 7) Rock and reach sideways, cues to twist big VC for all above exercises and demonstration for rock backwards step and for twist exercise.  Sit to stand functional component task with supervision 5 reps and simulated activities for: 1.Sit to stand functional component task with supervision 5 reps and simulated activities for: 2.hand coordination tasks such as buttons, writing 3.Hand strengthening tasks 4. dynamic stand balance practice such as ; toe tapping on blue foam 5.balance practice to be able to put on her pants in standing                      PT Education - 01/14/18 1445    Education provided  Yes    Education Details  LSVT BIG    Person(s) Educated  Patient    Methods  Explanation;Demonstration    Comprehension  Verbalized understanding;Returned demonstration          PT Long Term Goals - 12/26/17 0828      PT LONG TERM GOAL #1   Title  Patient will be able to stand with unilateral upper extremity support for > 30  minutes in order to independently perform ADL's.    Time  4    Period  Weeks    Status  New    Target Date  01/22/18      PT LONG TERM GOAL #2   Title  Patient will be able to reach > 12  inches without loss of balance to reduce fall risk with functional activities.    Time  4    Period  Weeks    Status  New    Target Date  01/22/18  PT LONG TERM GOAL #3   Title  Pt will increase 6 minute walk time by 150 feet to improve endurance and aid in improving mobility    Time  4    Period   Weeks    Status  New    Target Date  01/22/18      PT LONG TERM GOAL #4   Title  Patient will be independent with home exercise program for self-management of ankle symptoms/ difficulty.    Time  4    Period  Weeks    Status  New    Target Date  01/22/18            Plan - 01/14/18 1445    Clinical Impression Statement  Patient has unsteady stepping with several exercises but motor control improve with practice. Patient needs occasional verbal cueing to improve posture and cueing to correctly perform exercises slowly, holding at end of range to increase motor firing of desired muscle to encourage fatigue    Rehab Potential  Good    PT Frequency  4x / week    PT Duration  4 weeks    PT Treatment/Interventions  Therapeutic activities;Therapeutic exercise;Gait training;Balance training;Neuromuscular re-education    PT Next Visit Plan  LSVT BIG    PT Home Exercise Plan  LSVT BIG    Consulted and Agree with Plan of Care  Patient       Patient will benefit from skilled therapeutic intervention in order to improve the following deficits and impairments:     Visit Diagnosis: No diagnosis found.     Problem List Patient Active Problem List   Diagnosis Date Noted  . Chronic fatigue 10/23/2017  . Lesion of left lung 10/23/2017  . Genetic testing 05/01/2017  . Well woman exam 12/27/2016  . Parkinson disease ([place]) 01/10/2016  . DNR (do not resuscitate) 05/22/2015  . Tremor 02/07/2015  . Dysphagia, pharyngoesophageal phase 02/07/2015  . Hypercalcemia 09/14/2013  . Unspecified vitamin D deficiency 04/27/2013  . Breast cancer of upper-outer quadrant of left female breast ([place]) 09/16/2012  . Osteoporosis, post-menopausal 03/14/2012  . Aortic aneurysm ([place]) 01/01/2011  . OSTEOARTHRITIS, CERVICAL SPINE 08/15/2009  . HYPERGLYCEMIA, FASTING 12/06/2008  . ALLERGIC RHINITIS 03/25/2008  . Chronic obstructive airway disease with asthma ([place]) 03/25/2008  . BREAST CANCER, HX OF  03/10/2008  . Mixed hyperlipidemia 02/10/2007  . Essential hypertension 02/10/2007    Alanson Puls, PT DPT 01/15/2018, 2:06 PM  [place] MAIN [facility] SERVICES [address] [place], [place], [address] Phone: [phone]   Fax:  [phone]  Name: RONNITA PAZ MRN: [id number] Date of Birth: 04-26-1941

## 2018-01-16 ENCOUNTER — Ambulatory Visit: Payer: Medicare Other | Admitting: Physical Therapy

## 2018-01-16 DIAGNOSIS — R278 Other lack of coordination: Secondary | ICD-10-CM

## 2018-01-16 DIAGNOSIS — Z741 Need for assistance with personal care: Secondary | ICD-10-CM

## 2018-01-16 DIAGNOSIS — G20 Parkinson's disease: Secondary | ICD-10-CM

## 2018-01-16 DIAGNOSIS — R262 Difficulty in walking, not elsewhere classified: Secondary | ICD-10-CM

## 2018-01-16 DIAGNOSIS — M6281 Muscle weakness (generalized): Secondary | ICD-10-CM

## 2018-01-16 DIAGNOSIS — R460 Very low level of personal hygiene: Secondary | ICD-10-CM

## 2018-01-16 NOTE — Therapy (Signed)
[place] MAIN [facility] SERVICES [address] [place], [place], [address] Phone: [phone]   Fax:  [phone]  Patient Details  Name: Crystal Bass MRN: [id number] Date of Birth: 1941/10/20 Referring Provider:  Ludwig Clarks, DO  Encounter Date: 01/16/2018 Patient was not doing well with her breathing and was very labored, her BP / HR and O2 sat was WNL but she was recommended to not participate in PT today due to the difficulty with breathing.   [address], [place] DPT 01/16/2018, 2:27 PM  [place] MAIN [facility] SERVICES [address] [place], [place], [address] Phone: [phone]   Fax:  [phone]

## 2018-01-19 NOTE — Progress Notes (Signed)
I reviewed health advisor's note, was available for consultation, and agree with documentation and plan.  

## 2018-01-20 ENCOUNTER — Ambulatory Visit: Payer: Medicare Other | Admitting: Physical Therapy

## 2018-01-20 ENCOUNTER — Encounter: Payer: Self-pay | Admitting: Physical Therapy

## 2018-01-20 DIAGNOSIS — G20 Parkinson's disease: Secondary | ICD-10-CM

## 2018-01-20 DIAGNOSIS — R278 Other lack of coordination: Secondary | ICD-10-CM

## 2018-01-20 DIAGNOSIS — R460 Very low level of personal hygiene: Secondary | ICD-10-CM

## 2018-01-20 DIAGNOSIS — R262 Difficulty in walking, not elsewhere classified: Secondary | ICD-10-CM

## 2018-01-20 DIAGNOSIS — Z741 Need for assistance with personal care: Secondary | ICD-10-CM

## 2018-01-20 DIAGNOSIS — M6281 Muscle weakness (generalized): Secondary | ICD-10-CM

## 2018-01-20 NOTE — Therapy (Signed)
[place] MAIN [facility] SERVICES [place], [place], [address] Phone: [phone]   Fax:  [phone]  Physical Therapy Treatment  Patient Details  Name: Crystal Bass MRN: [id number] Date of Birth: Dec 25, 1940 Referring Provider: TAT, Eustace Quail    Encounter Date: 01/20/2018  PT End of Session - 01/20/18 1400    Visit Number  10    Number of Visits  17    Date for PT Re-Evaluation  01/29/18    PT Start Time  0200    PT Stop Time  0300    PT Time Calculation (min)  60 min    Equipment Utilized During Treatment  Gait belt    Activity Tolerance  Patient tolerated treatment well;Patient limited by fatigue    Behavior During Therapy  [facility] for tasks assessed/performed       Past Medical History:  Diagnosis Date  . 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 Aortic root aneurysm ([place])   . Breast cancer ([place]) 2002   infiltrative ductal carcinoma   . Cataract 2019  . COPD (chronic obstructive pulmonary disease) ([place]) 9/08  . Ductal carcinoma of breast, estrogen receptor positive, stage 1 ([place]) 09/16/2012  . Endometriosis   . Hypercalcemia 09/14/2013  . Hypertension   . Lesion of breast 1992   right, benign  . Lung disease    secondary to [name] infection  . Mastalgia 7/95  . Osteoporosis, post-menopausal 03/14/2012  . Ovarian cancer ([place]) 2004  . Pancreatitis    secondary to Cholelithiasis  . Parkinson disease ([place]) 02/10/15  . Post-thoracotomy pain syndrome   . Pseudomonas pneumonia ([place]) 5/09  . Uterine fibroid   . Vitamin D deficiency     Past Surgical History:  Procedure Laterality Date  . 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 APPENDECTOMY  2004  . BREAST BIOPSY  08/2004   right breast-benign  . BREAST LUMPECTOMY  1992   benign  . CARDIAC CATHETERIZATION  2007   essentially negation for significant CAD  . CHOLECYSTECTOMY  2001  . COLONOSCOPY   2014   Dr [name] , due 2019  . LUNG BIOPSY  2002   [name], Dr [name]  . MASTECTOMY MODIFIED RADICAL Left 2002   oral chemotheraphy (tamoxifen then Armidex) no radiation, Dr.[name]  . TONSILLECTOMY  1946    There were no vitals filed for this visit.  Subjective Assessment - 01/20/18 1359    Subjective  Patient reports that she is having a much better day today.     Limitations  Standing;Walking;Writing    Currently in Pain?  No/denies    Pain Score  0-No pain    Pain Onset  Today       Patient seen for LSVT Daily Session Maximal Daily Exercises for facilitation/coordination of movement Sustained movements are designed to rescale the amplitude of movement output for generalization to daily functional activities .Performed as follows for 1 set of 10 repetitions each multidirectional sustained movements  1) Floor to ceiling, cues to reach fwd more 2) Side to side multidirectional Repetitive movements performed in standing and are designed to provide retraining effort needed for sustained muscle activation in tasks  3) Step and reach forward , cue to finish the exercise 4) Step and reach backwards, cues to bring both UE back and lift toe, has difficulty with switching feet 5) Step and  reach sideways, needs cues to turn his head 6) Rock and reach forward/backward , cues to keep feet from moving fwd 7) Rock and reach sideways, cues to twist big VC for all above exercises and demonstration for rock backwards step and for twist exercise.  Sit to stand functional component task with supervision 5 reps and simulated activities for: 1.Sit to stand functional component task with supervision 5 reps and simulated activities for: 2.hand coordination tasks such as buttons, writing 3.Hand strengthening tasks 4. dynamic stand balance practice such as ; toe tapping on blue foam 5.balance practice to be able to put on her pants in standing  Patients performance improves with practice and   uses UE 10% to help support and for balance; frequent rests for SOB.                        PT Education - 01/20/18 1400    Education provided  Yes    Education Details  LSVT BIG    Person(s) Educated  Patient    Methods  Explanation    Comprehension  Verbalized understanding          PT Long Term Goals - 12/26/17 0828      PT LONG TERM GOAL #1   Title  Patient will be able to stand with unilateral upper extremity support for > 30  minutes in order to independently perform ADL's.    Time  4    Period  Weeks    Status  New    Target Date  01/22/18      PT LONG TERM GOAL #2   Title  Patient will be able to reach > 12  inches without loss of balance to reduce fall risk with functional activities.    Time  4    Period  Weeks    Status  New    Target Date  01/22/18      PT LONG TERM GOAL #3   Title  Pt will increase 6 minute walk time by 150 feet to improve endurance and aid in improving mobility    Time  4    Period  Weeks    Status  New    Target Date  01/22/18      PT LONG TERM GOAL #4   Title  Patient will be independent with home exercise program for self-management of ankle symptoms/ difficulty.    Time  4    Period  Weeks    Status  New    Target Date  01/22/18            Plan - 01/20/18 1401    Clinical Impression Statement  Pt requires direction and verbal cues for correct performance of lsvt big exercises. Patient has fatigue with endurance with breathing. Patient struggles with speed during movement as well as balance with unstable surfaces. Pt encouraged to continue HEP Patient will benefit from continued skilled PT to improve mobility and safety    Rehab Potential  Good    PT Frequency  4x / week    PT Duration  4 weeks    PT Treatment/Interventions  Therapeutic activities;Therapeutic exercise;Gait training;Balance training;Neuromuscular re-education    PT Next Visit Plan  LSVT BIG    PT Home Exercise Plan  LSVT BIG    Consulted  and Agree with Plan of Care  Patient       Patient will benefit from skilled therapeutic intervention in order to improve the  following deficits and impairments:  Decreased balance, Decreased endurance, Decreased mobility, Difficulty walking, Decreased coordination, Decreased activity tolerance  Visit Diagnosis: Parkinson disease ([place])  Difficulty walking  Muscle weakness (generalized)  Other lack of coordination  Self-care deficit for hygiene     Problem List Patient Active Problem List   Diagnosis Date Noted  . Chronic fatigue 10/23/2017  . Lesion of left lung 10/23/2017  . Genetic testing 05/01/2017  . Well woman exam 12/27/2016  . Parkinson disease ([place]) 01/10/2016  . DNR (do not resuscitate) 05/22/2015  . Tremor 02/07/2015  . Dysphagia, pharyngoesophageal phase 02/07/2015  . Hypercalcemia 09/14/2013  . Unspecified vitamin D deficiency 04/27/2013  . Breast cancer of upper-outer quadrant of left female breast ([place]) 09/16/2012  . Osteoporosis, post-menopausal 03/14/2012  . Aortic aneurysm ([place]) 01/01/2011  . OSTEOARTHRITIS, CERVICAL SPINE 08/15/2009  . HYPERGLYCEMIA, FASTING 12/06/2008  . ALLERGIC RHINITIS 03/25/2008  . Chronic obstructive airway disease with asthma ([place]) 03/25/2008  . BREAST CANCER, HX OF 03/10/2008  . Mixed hyperlipidemia 02/10/2007  . Essential hypertension 02/10/2007    Alanson Puls , [place] DPT 01/20/2018, 2:02 PM  [place] MAIN [facility] SERVICES [address] [place], [place], [address] Phone: [phone]   Fax:  [phone]  Name: ALEXSANDRIA KIVETT MRN: [id number] Date of Birth: November 16, 1941

## 2018-01-21 ENCOUNTER — Encounter: Payer: Self-pay | Admitting: Physical Therapy

## 2018-01-21 ENCOUNTER — Ambulatory Visit (INDEPENDENT_AMBULATORY_CARE_PROVIDER_SITE_OTHER): Payer: Medicare Other | Admitting: Family Medicine

## 2018-01-21 ENCOUNTER — Ambulatory Visit: Payer: Medicare Other | Admitting: Physical Therapy

## 2018-01-21 ENCOUNTER — Encounter: Payer: Self-pay | Admitting: Family Medicine

## 2018-01-21 VITALS — BP 130/82 | HR 56 | Temp 97.9°F | Ht 63.0 in | Wt 149.0 lb

## 2018-01-21 DIAGNOSIS — J449 Chronic obstructive pulmonary disease, unspecified: Secondary | ICD-10-CM

## 2018-01-21 DIAGNOSIS — E782 Mixed hyperlipidemia: Secondary | ICD-10-CM

## 2018-01-21 DIAGNOSIS — R7989 Other specified abnormal findings of blood chemistry: Secondary | ICD-10-CM

## 2018-01-21 DIAGNOSIS — M810 Age-related osteoporosis without current pathological fracture: Secondary | ICD-10-CM

## 2018-01-21 DIAGNOSIS — Z66 Do not resuscitate: Secondary | ICD-10-CM | POA: Diagnosis not present

## 2018-01-21 DIAGNOSIS — M6281 Muscle weakness (generalized): Secondary | ICD-10-CM

## 2018-01-21 DIAGNOSIS — G20 Parkinson's disease: Secondary | ICD-10-CM | POA: Diagnosis not present

## 2018-01-21 DIAGNOSIS — R278 Other lack of coordination: Secondary | ICD-10-CM

## 2018-01-21 DIAGNOSIS — Z0001 Encounter for general adult medical examination with abnormal findings: Secondary | ICD-10-CM

## 2018-01-21 DIAGNOSIS — I10 Essential (primary) hypertension: Secondary | ICD-10-CM | POA: Diagnosis not present

## 2018-01-21 DIAGNOSIS — K769 Liver disease, unspecified: Secondary | ICD-10-CM | POA: Diagnosis not present

## 2018-01-21 DIAGNOSIS — R262 Difficulty in walking, not elsewhere classified: Secondary | ICD-10-CM

## 2018-01-21 DIAGNOSIS — E538 Deficiency of other specified B group vitamins: Secondary | ICD-10-CM | POA: Insufficient documentation

## 2018-01-21 DIAGNOSIS — R0789 Other chest pain: Secondary | ICD-10-CM | POA: Diagnosis not present

## 2018-01-21 DIAGNOSIS — Z853 Personal history of malignant neoplasm of breast: Secondary | ICD-10-CM

## 2018-01-21 DIAGNOSIS — Z741 Need for assistance with personal care: Secondary | ICD-10-CM

## 2018-01-21 DIAGNOSIS — R460 Very low level of personal hygiene: Secondary | ICD-10-CM

## 2018-01-21 MED ORDER — LOSARTAN POTASSIUM 50 MG PO TABS: 50.0000 mg | ORAL_TABLET | Freq: Every day | ORAL | 3 refills | Status: DC

## 2018-01-21 MED ORDER — ACETAMINOPHEN 500 MG PO TABS: 1000.0000 mg | ORAL_TABLET | Freq: Two times a day (BID) | ORAL | Status: DC | PRN

## 2018-01-21 MED ORDER — GABAPENTIN 300 MG PO CAPS: 600.0000 mg | ORAL_CAPSULE | Freq: Three times a day (TID) | ORAL | 3 refills | Status: DC

## 2018-01-21 MED ORDER — METOPROLOL TARTRATE 25 MG PO TABS: 25.0000 mg | ORAL_TABLET | Freq: Two times a day (BID) | ORAL | 3 refills | Status: DC

## 2018-01-21 NOTE — Assessment & Plan Note (Signed)
Continue yearly unilateral mammograms 

## 2018-01-21 NOTE — Assessment & Plan Note (Signed)
She has not been taking b12 replacement - encouraged she start 1035mcg daily.

## 2018-01-21 NOTE — Assessment & Plan Note (Signed)
Chronic, stable. Off medication. The 10-year ASCVD risk score Mikey Bussing DC Brooke Bonito., et al., 2013) is: 19.3%   Values used to calculate the score:     Age: 77 years     Sex: Female     Is Non-Hispanic African American: No     Diabetic: No     Tobacco smoker: No     Systolic Blood Pressure: 151 mmHg     Is BP treated: Yes     HDL Cholesterol: 44.9 mg/dL     Total Cholesterol: 172 mg/dL

## 2018-01-21 NOTE — Assessment & Plan Note (Signed)
Followed by cancer center. Encouraged regular walking. Upcoming DEXA through them later this year.

## 2018-01-21 NOTE — Assessment & Plan Note (Signed)
Preventative protocols reviewed and updated unless pt declined. Discussed healthy diet and lifestyle.  

## 2018-01-21 NOTE — Assessment & Plan Note (Signed)
Appreciate neuro care.

## 2018-01-21 NOTE — Assessment & Plan Note (Signed)
H/o indolent [name].  Last saw pulm ([place]) 2015. Suggested f/u esp if worsening dyspnea noted.

## 2018-01-21 NOTE — Assessment & Plan Note (Signed)
Anticipate rib strain. Supportive care reviewed. Update if not improving with treatment.

## 2018-01-21 NOTE — Patient Instructions (Addendum)
Good to see you today, call us with questions. If interested, check with pharmacy about new 2 shot shingles series (shingrix).  You are doing well today.  We will check abdominal ultrasound - see Rosaria Ferries or [place] on your way out.  Sounds like discomfort may be coming from ribcage - heating pad, tylenol as needed, rest ribs.  Health Maintenance, Female Adopting a healthy lifestyle and getting preventive care can go a long way to promote health and wellness. Talk with your health care provider about what schedule of regular examinations is right for you. This is a good chance for you to check in with your provider about disease prevention and staying healthy. In between checkups, there are plenty of things you can do on your own. Experts have done a lot of research about which lifestyle changes and preventive measures are most likely to keep you healthy. Ask your health care provider for more information. Weight and diet Eat a healthy diet  Be sure to include plenty of vegetables, fruits, low-fat dairy products, and lean protein.  Do not eat a lot of foods high in solid fats, added sugars, or salt.  Get regular exercise. This is one of the most important things you can do for your health. ? Most adults should exercise for at least 150 minutes each week. The exercise should increase your heart rate and make you sweat (moderate-intensity exercise). ? Most adults should also do strengthening exercises at least twice a week. This is in addition to the moderate-intensity exercise.  Maintain a healthy weight  Body mass index (BMI) is a measurement that can be used to identify possible weight problems. It estimates body fat based on height and weight. Your health care provider can help determine your BMI and help you achieve or maintain a healthy weight.  For females 67 years of age and older: ? A BMI below 18.5 is considered underweight. ? A BMI of 18.5 to 24.9 is normal. ? A BMI of 25 to 29.9  is considered overweight. ? A BMI of 30 and above is considered obese.  Watch levels of cholesterol and blood lipids  You should start having your blood tested for lipids and cholesterol at 75 years of age, then have this test every 5 years.  You may need to have your cholesterol levels checked more often if: ? Your lipid or cholesterol levels are high. ? You are older than 75 years of age. ? You are at high risk for heart disease.  Cancer screening Lung Cancer  Lung cancer screening is recommended for adults 69-24 years old who are at high risk for lung cancer because of a history of smoking.  A yearly low-dose CT scan of the lungs is recommended for people who: ? Currently smoke. ? Have quit within the past 15 years. ? Have at least a 30-pack-year history of smoking. A pack year is smoking an average of one pack of cigarettes a day for 1 year.  Yearly screening should continue until it has been 15 years since you quit.  Yearly screening should stop if you develop a health problem that would prevent you from having lung cancer treatment.  Breast Cancer  Practice breast self-awareness. This means understanding how your breasts normally appear and feel.  It also means doing regular breast self-exams. Let your health care provider know about any changes, no matter how small.  If you are in your 20s or 30s, you should have a clinical breast exam (CBE) by a  health care provider every 1-3 years as part of a regular health exam.  If you are 63 or older, have a CBE every year. Also consider having a breast X-ray (mammogram) every year.  If you have a family history of breast cancer, talk to your health care provider about genetic screening.  If you are at high risk for breast cancer, talk to your health care provider about having an MRI and a mammogram every year.  Breast cancer gene (BRCA) assessment is recommended for women who have family members with BRCA-related cancers.  BRCA-related cancers include: ? Breast. ? Ovarian. ? Tubal. ? Peritoneal cancers.  Results of the assessment will determine the need for genetic counseling and BRCA1 and BRCA2 testing.  Cervical Cancer Your health care provider may recommend that you be screened regularly for cancer of the pelvic organs (ovaries, uterus, and vagina). This screening involves a pelvic examination, including checking for microscopic changes to the surface of your cervix (Pap test). You may be encouraged to have this screening done every 3 years, beginning at age 12.  For women ages 41-65, health care providers may recommend pelvic exams and Pap testing every 3 years, or they may recommend the Pap and pelvic exam, combined with testing for human papilloma virus (HPV), every 5 years. Some types of HPV increase your risk of cervical cancer. Testing for HPV may also be done on women of any age with unclear Pap test results.  Other health care providers may not recommend any screening for nonpregnant women who are considered low risk for pelvic cancer and who do not have symptoms. Ask your health care provider if a screening pelvic exam is right for you.  If you have had past treatment for cervical cancer or a condition that could lead to cancer, you need Pap tests and screening for cancer for at least 20 years after your treatment. If Pap tests have been discontinued, your risk factors (such as having a new sexual partner) need to be reassessed to determine if screening should resume. Some women have medical problems that increase the chance of getting cervical cancer. In these cases, your health care provider may recommend more frequent screening and Pap tests.  Colorectal Cancer  This type of cancer can be detected and often prevented.  Routine colorectal cancer screening usually begins at 75 years of age and continues through 75 years of age.  Your health care provider may recommend screening at an earlier age if  you have risk factors for colon cancer.  Your health care provider may also recommend using home test kits to check for hidden blood in the stool.  A small camera at the end of a tube can be used to examine your colon directly (sigmoidoscopy or colonoscopy). This is done to check for the earliest forms of colorectal cancer.  Routine screening usually begins at age 20.  Direct examination of the colon should be repeated every 5-10 years through 75 years of age. However, you may need to be screened more often if early forms of precancerous polyps or small growths are found.  Skin Cancer  Check your skin from head to toe regularly.  Tell your health care provider about any new moles or changes in moles, especially if there is a change in a mole's shape or color.  Also tell your health care provider if you have a mole that is larger than the size of a pencil eraser.  Always use sunscreen. Apply sunscreen liberally and repeatedly throughout  the day.  Protect yourself by wearing long sleeves, pants, a wide-brimmed hat, and sunglasses whenever you are outside.  Heart disease, diabetes, and high blood pressure  High blood pressure causes heart disease and increases the risk of stroke. High blood pressure is more likely to develop in: ? People who have blood pressure in the high end of the normal range (130-139/85-89 mm Hg). ? People who are overweight or obese. ? People who are African American.  If you are 90-31 years of age, have your blood pressure checked every 3-5 years. If you are 79 years of age or older, have your blood pressure checked every year. You should have your blood pressure measured twice-once when you are at a hospital or clinic, and once when you are not at a hospital or clinic. Record the average of the two measurements. To check your blood pressure when you are not at a hospital or clinic, you can use: ? An automated blood pressure machine at a pharmacy. ? A home blood  pressure monitor.  If you are between 61 years and 42 years old, ask your health care provider if you should take aspirin to prevent strokes.  Have regular diabetes screenings. This involves taking a blood sample to check your fasting blood sugar level. ? If you are at a normal weight and have a low risk for diabetes, have this test once every three years after 75 years of age. ? If you are overweight and have a high risk for diabetes, consider being tested at a younger age or more often. Preventing infection Hepatitis B  If you have a higher risk for hepatitis B, you should be screened for this virus. You are considered at high risk for hepatitis B if: ? You were born in a country where hepatitis B is common. Ask your health care provider which countries are considered high risk. ? Your parents were born in a high-risk country, and you have not been immunized against hepatitis B (hepatitis B vaccine). ? You have HIV or AIDS. ? You use needles to inject street drugs. ? You live with someone who has hepatitis B. ? You have had sex with someone who has hepatitis B. ? You get hemodialysis treatment. ? You take certain medicines for conditions, including cancer, organ transplantation, and autoimmune conditions.  Hepatitis C  Blood testing is recommended for: ? Everyone born from [birth year] through 1963. ? Anyone with known risk factors for hepatitis C.  Sexually transmitted infections (STIs)  You should be screened for sexually transmitted infections (STIs) including gonorrhea and chlamydia if: ? You are sexually active and are younger than 75 years of age. ? You are older than 75 years of age and your health care provider tells you that you are at risk for this type of infection. ? Your sexual activity has changed since you were last screened and you are at an increased risk for chlamydia or gonorrhea. Ask your health care provider if you are at risk.  If you do not have HIV, but are at risk,  it may be recommended that you take a prescription medicine daily to prevent HIV infection. This is called pre-exposure prophylaxis (PrEP). You are considered at risk if: ? You are sexually active and do not regularly use condoms or know the HIV status of your partner(s). ? You take drugs by injection. ? You are sexually active with a partner who has HIV.  Talk with your health care provider about whether you are at  high risk of being infected with HIV. If you choose to begin PrEP, you should first be tested for HIV. You should then be tested every 3 months for as long as you are taking PrEP. Pregnancy  If you are premenopausal and you may become pregnant, ask your health care provider about preconception counseling.  If you may become pregnant, take 400 to 800 micrograms (mcg) of folic acid every day.  If you want to prevent pregnancy, talk to your health care provider about birth control (contraception). Osteoporosis and menopause  Osteoporosis is a disease in which the bones lose minerals and strength with aging. This can result in serious bone fractures. Your risk for osteoporosis can be identified using a bone density scan.  If you are 82 years of age or older, or if you are at risk for osteoporosis and fractures, ask your health care provider if you should be screened.  Ask your health care provider whether you should take a calcium or vitamin D supplement to lower your risk for osteoporosis.  Menopause may have certain physical symptoms and risks.  Hormone replacement therapy may reduce some of these symptoms and risks. Talk to your health care provider about whether hormone replacement therapy is right for you. Follow these instructions at home:  Schedule regular health, dental, and eye exams.  Stay current with your immunizations.  Do not use any tobacco products including cigarettes, chewing tobacco, or electronic cigarettes.  If you are pregnant, do not drink  alcohol.  If you are breastfeeding, limit how much and how often you drink alcohol.  Limit alcohol intake to no more than 1 drink per day for nonpregnant women. One drink equals 12 ounces of beer, 5 ounces of wine, or 1 ounces of hard liquor.  Do not use street drugs.  Do not share needles.  Ask your health care provider for help if you need support or information about quitting drugs.  Tell your health care provider if you often feel depressed.  Tell your health care provider if you have ever been abused or do not feel safe at home. This information is not intended to replace advice given to you by your health care provider. Make sure you discuss any questions you have with your health care provider. Document Released: 05/28/2011 Document Revised: 04/19/2016 Document Reviewed: 08/16/2015 Elsevier Interactive Patient Education  [employer].

## 2018-01-21 NOTE — Progress Notes (Signed)
BP 130/82 (BP Location: Left Arm, Patient Position: Sitting, Cuff Size: Normal)   Pulse (!) 56   Temp 97.9 F (36.6 C) (Oral)   Ht 5\' 3"  (1.6 m)   Wt 149 lb (67.6 kg)   SpO2 95%   BMI 26.39 kg/m    CC: CPE Subjective:    Patient ID: Crystal Bass, female    DOB: [birth year], 77 y.o.   MRN: [id number]  HPI: Crystal Bass is a 77 y.o. female presenting on 01/21/2018 for Annual Exam (Pt 2.)   Saw [name] last week for medicare wellness visit. Note reviewed.    Ongoing dyspnea. Pulmonologist is Dr Lynford Citizen last seen 01/2014. Receives weekly allergy shots through allergy doctor.  Known R liver lesion presumed hemangioma, did increase in size 02/2017 CT (chest). Now noticing increasing R abdominal discomfort for last few months.  Preventative: COLONOSCOPY 2014 Dr [name] , due 02/2018 Lung cancer screening - not eligible Breast cancer screening - h/o breast cancer s/p L mastectomy 2002. R mammogram WNL 10/2017.  Well woman exam - with OBGYN Dr Sabra Heck. 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 Last seen 04/2017.  DEXA scan - known osteoporosis previously on zometa. Upcoming DEXA (followed by Dr Payton Mccallum at cancer center).  Flu shot yearly Td 2012 Pneumovax 2013, prevnar 2015 zostavax 2008 shingrix - discussed Advanced directive discussion - is DNR. HCPOA is brother Gershon Mussel) who lives in [place].  Seat belt use discussed Sunscreen use discussed, no changing moles on skin. Sees derm. 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 Interim medical history since our last visit reviewed. Allergies and medications reviewed and updated. Outpatient Medications  Prior to Visit  Medication Sig Dispense Refill  . Albuterol Sulfate ([name]) 409 (90 Base) MCG/ACT AEPB Inhale 2 puffs into the lungs every 6 (six) hours as needed (dyspnea, wheezing).    [name] aspirin 81 MG tablet Take 81 mg by mouth daily.      . carbidopa-levodopa ([name]) 25-100 MG tablet TAKE 2 TABLETS IN THE MORNING, 1 TABLET IN THE AFTERNOON, 1 IN THE EVENING 360 tablet 1  . clonazePAM (KLONOPIN) 0.5 MG tablet TAKE 0.5-1 TABLET BY MOUTH AT BEDTIME 90 tablet 1  . fluticasone ([name]) 110 MCG/ACT inhaler Inhale 2 puffs into the lungs 2 (two) times daily.    . vitamin B-12 (CYANOCOBALAMIN) 1000 MCG tablet Take 1 tablet (1,000 mcg total) by mouth daily.    . Vitamin D, Ergocalciferol, ([name]) 50000 units CAPS capsule TAKE 1 CAPSULE BY MOUTH EVERY WEEK 12 capsule 3  . acetaminophen (TYLENOL) 325 MG tablet Take 1,000 mg by mouth 2 (two) times daily as needed.     . gabapentin ([name]) 300 MG capsule Take 2 capsules (600 mg total) by mouth 3 (three) times daily. 450 capsule 3  . losartan (COZAAR) 50 MG tablet TAKE 1 TABLET EVERY DAY 90 tablet 3  . metoprolol tartrate (LOPRESSOR) 25 MG tablet TAKE 1 TABLET BY MOUTH TWICE A DAY 180 tablet 2   No facility-administered medications prior to visit.      Per HPI unless specifically indicated in ROS section below Review of Systems  Constitutional: Negative for activity change, appetite change, chills, fatigue, fever and  unexpected weight change.  HENT: Negative for hearing loss.   Eyes: Negative for visual disturbance.  Respiratory: Positive for cough and shortness of breath (chronic). Negative for chest tightness and wheezing.   Cardiovascular: Negative for chest pain, palpitations and leg swelling.  Gastrointestinal: Positive for abdominal pain (intermittent R sided discomfort worse with twisting). Negative for abdominal distention, blood in stool, constipation, diarrhea, nausea and vomiting.  Genitourinary: Negative for  difficulty urinating and hematuria.  Musculoskeletal: Negative for arthralgias, myalgias and neck pain.  Skin: Negative for rash.  Neurological: Positive for headaches. Negative for dizziness, seizures and syncope.  Hematological: Negative for adenopathy. Does not bruise/bleed easily.  Psychiatric/Behavioral: Negative for dysphoric mood. The patient is not nervous/anxious.        Objective:    BP 130/82 (BP Location: Left Arm, Patient Position: Sitting, Cuff Size: Normal)   Pulse (!) 56   Temp 97.9 F (36.6 C) (Oral)   Ht 5\' 3"  (1.6 m)   Wt 149 lb (67.6 kg)   SpO2 95%   BMI 26.39 kg/m   Wt Readings from Last 3 Encounters:  01/21/18 149 lb (67.6 kg)  01/14/18 147 lb 4 oz (66.8 kg)  10/25/17 149 lb (67.6 kg)    Physical Exam  Constitutional: She is oriented to person, place, and time. She appears well-developed and well-nourished. No distress.  HENT:  Head: Normocephalic and atraumatic.  Right Ear: Hearing, tympanic membrane, external ear and ear canal normal.  Left Ear: Hearing, tympanic membrane, external ear and ear canal normal.  Nose: Nose normal.  Mouth/Throat: Uvula is midline, oropharynx is clear and moist and mucous membranes are normal. No oropharyngeal exudate, posterior oropharyngeal edema or posterior oropharyngeal erythema.  Eyes: Conjunctivae and EOM are normal. Pupils are equal, round, and reactive to light. No scleral icterus.  Neck: Normal range of motion. Neck supple. Carotid bruit is not present. No thyromegaly present.  Cardiovascular: Normal rate, regular rhythm, normal heart sounds and intact distal pulses.  No murmur heard. Pulses:      Radial pulses are 2+ on the right side, and 2+ on the left side.  Pulmonary/Chest: Effort normal and breath sounds normal. No respiratory distress. She has no wheezes. She has no rales.  Abdominal: Soft. Bowel sounds are normal. She exhibits no distension and no mass. There is no tenderness. There is no rebound and no  guarding.  Musculoskeletal: Normal range of motion. She exhibits no edema.  Lymphadenopathy:    She has no cervical adenopathy.  Neurological: She is alert and oriented to person, place, and time.  CN grossly intact, station and gait intact  Skin: Skin is warm and dry. No rash noted.  Psychiatric: She has a normal mood and affect. Her behavior is normal. Judgment and thought content normal.  Nursing note and vitals reviewed.  Results for orders placed or performed in visit on 01/14/18  Vitamin B12  Result Value Ref Range   Vitamin B-12 239 211 - 911 pg/mL  VITAMIN D 25 Hydroxy (Vit-D Deficiency, Fractures)  Result Value Ref Range   VITD 51.18 30.00 - 100.00 ng/mL  Basic metabolic panel  Result Value Ref Range   Sodium 136 135 - 145 mEq/L   Potassium 4.5 3.5 - 5.1 mEq/L   Chloride 103 96 - 112 mEq/L   CO2 29 19 - 32 mEq/L   Glucose, Bld 91 70 - 99 mg/dL   BUN 15 6 - 23 mg/dL   Creatinine, Ser 0.77 0.40 - 1.20 mg/dL   Calcium  9.3 8.4 - 10.5 mg/dL   GFR 77.71 >60.00 mL/min  Hemoglobin A1c  Result Value Ref Range   Hgb A1c MFr Bld 5.3 4.6 - 6.5 %  Lipid panel  Result Value Ref Range   Cholesterol 172 0 - 200 mg/dL   Triglycerides 161.0 (H) 0.0 - 149.0 mg/dL   HDL 44.90 >39.00 mg/dL   VLDL 32.2 0.0 - 40.0 mg/dL   LDL Cholesterol 95 0 - 99 mg/dL   Total CHOL/HDL Ratio 4    NonHDL 127.13       Assessment & Plan:   Problem List Items Addressed This Visit    BREAST CANCER, HX OF    Continue yearly unilateral mammograms.      Chronic obstructive airway disease with asthma ([place])    H/o indolent [name].  Last saw pulm (Ramaswami) 2015. Suggested f/u esp if worsening dyspnea noted.      Relevant Medications   fluticasone ([name]) 110 MCG/ACT inhaler   DNR (do not resuscitate)    Again reviewed with patient - desires to remain DNR.       Encounter for general adult medical examination with abnormal findings - Primary    Preventative protocols reviewed and updated  unless pt declined. Discussed healthy diet and lifestyle.       Essential hypertension    Chronic, stable. Continue current regimen.       Relevant Medications   losartan (COZAAR) 50 MG tablet   metoprolol tartrate (LOPRESSOR) 25 MG tablet   Liver lesion    Update ultrasound. Presumed benign hemangioma but slightly larger on latest check      Relevant Orders   US Abdomen Complete   Low serum vitamin B12    She has not been taking b12 replacement - encouraged she start 1060mcg daily.       Mixed hyperlipidemia    Chronic, stable. Off medication. The 10-year ASCVD risk score Mikey Bussing DC Brooke Bonito., et al., 2013) is: 19.3%   Values used to calculate the score:     Age: 16 years     Sex: Female     Is Non-Hispanic African American: No     Diabetic: No     Tobacco smoker: No     Systolic Blood Pressure: 454 mmHg     Is BP treated: Yes     HDL Cholesterol: 44.9 mg/dL     Total Cholesterol: 172 mg/dL       Relevant Medications   losartan (COZAAR) 50 MG tablet   metoprolol tartrate (LOPRESSOR) 25 MG tablet   Osteoporosis, post-menopausal    Followed by cancer center. Encouraged regular walking. Upcoming DEXA through them later this year.       Parkinson disease [facility])    Appreciate neuro care.       Relevant Medications   gabapentin ([name]) 300 MG capsule   Right-sided chest wall pain    Anticipate rib strain. Supportive care reviewed. Update if not improving with treatment.           Meds ordered this encounter  Medications  . gabapentin ([name]) 300 MG capsule    Sig: Take 2 capsules (600 mg total) by mouth 3 (three) times daily.    Dispense:  540 capsule    Refill:  3  . losartan (COZAAR) 50 MG tablet    Sig: Take 1 tablet (50 mg total) by mouth daily.    Dispense:  90 tablet    Refill:  3  . metoprolol tartrate (LOPRESSOR) 25 MG  tablet    Sig: Take 1 tablet (25 mg total) by mouth 2 (two) times daily.    Dispense:  180 tablet    Refill:  3  . acetaminophen  (TYLENOL) 500 MG tablet    Sig: Take 2 tablets (1,000 mg total) by mouth 2 (two) times daily as needed.   Orders Placed This Encounter  Procedures  . US Abdomen Complete    Epic ORDER Wt 149/no needs Ins [facility] mcr sb w/marian @ office     Standing Status:   Future    Standing Expiration Date:   03/22/2019    Order Specific Question:   Reason for Exam (SYMPTOM  OR DIAGNOSIS REQUIRED)    Answer:   RUQ abd discomfort, f/u liver lesion    Order Specific Question:   Preferred imaging location?    Answer:   [facility]    Follow up plan: Return in about 1 year (around 01/21/2019) for annual exam, prior fasting for blood work, medicare wellness visit.  [name], MD

## 2018-01-21 NOTE — Therapy (Signed)
[place] MAIN [facility] SERVICES [place], [place], [address] Phone: [phone]   Fax:  [phone]  Physical Therapy Treatment  Patient Details  Name: Crystal Bass MRN: [id number] Date of Birth: [birth year] Referring Provider: Ludwig Clarks    Encounter Date: 01/21/2018  PT End of Session - 01/21/18 1402    Visit Number  11    Number of Visits  17    Date for PT Re-Evaluation  01/29/18    PT Start Time  0200    PT Stop Time  0300    PT Time Calculation (min)  60 min    Equipment Utilized During Treatment  Gait belt    Activity Tolerance  Patient tolerated treatment well;Patient limited by fatigue    Behavior During Therapy  [facility] for tasks assessed/performed       Past Medical History:  Diagnosis Date  . 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 Aortic root aneurysm ([place])   . Breast cancer ([place]) 2002   infiltrative ductal carcinoma   . Cataract 2019  . COPD (chronic obstructive pulmonary disease) ([place]) 9/08  . Ductal carcinoma of breast, estrogen receptor positive, stage 1 ([place]) 09/16/2012  . Endometriosis   . Hypercalcemia 09/14/2013  . Hypertension   . Lesion of breast 1992   right, benign  . Lung disease    secondary to [name] infection  . Mastalgia 7/95  . Osteoporosis, post-menopausal 03/14/2012  . Ovarian cancer ([place]) 2004  . Pancreatitis    secondary to Cholelithiasis  . Parkinson disease ([place]) 02/10/15  . Post-thoracotomy pain syndrome   . Pseudomonas pneumonia ([place]) 5/09  . Uterine fibroid   . Vitamin D deficiency     Past Surgical History:  Procedure Laterality Date  . 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 APPENDECTOMY  2004  . BREAST BIOPSY  08/2004   right breast-benign  . BREAST LUMPECTOMY  1992   benign  . CARDIAC CATHETERIZATION  2007   essentially negation for significant CAD  . CHOLECYSTECTOMY  2001  . COLONOSCOPY   2014   Dr [name] , due 2019  . LUNG BIOPSY  2002   [name], Dr [name]  . MASTECTOMY MODIFIED RADICAL Left 2002   oral chemotheraphy (tamoxifen then Armidex) no radiation, Dr.[name]  . TONSILLECTOMY  1946    There were no vitals filed for this visit.  Subjective Assessment - 01/21/18 1401    Subjective  Patient reports that she is having a much better day today.     Limitations  Standing;Walking;Writing    Currently in Pain?  No/denies    Pain Score  0-No pain    Pain Onset  Today       Patient seen for LSVT Daily Session Maximal Daily Exercises for facilitation/coordination of movement Sustained movements are designed to rescale the amplitude of movement output for generalization to daily functional activities .Performed as follows for 1 set of 10 repetitions each multidirectional sustained movements  1) Floor to ceiling, cues to reach fwd more 2) Side to side, cues to pronate UE towards the ceiling  multidirectional Repetitive movements performed in standing and are designed to provide retraining effort needed for sustained muscle activation in tasks  3) Step and reach forward , cue to finish the exercise with hand on her legs 4) Step and reach backwards, cues to bring both  UE back and lift toe, has difficulty with switching feet 5) Step and reach sideways, needs cues to turn his head and step at the same time 6) Rock and reach forward/backward , cues to keep feet from moving fwd 7) Rock and reach sideways, cues to twist big VC for all above exercises and demonstration for rock backwards step and for twist exercise.  Sit to stand functional component task with supervision 5 reps and simulated activities for: 1.Sit to stand functional component task with supervision 5 reps and simulated activities for: 2.hand coordination tasks such as buttons, writing 3.Hand strengthening tasks 4. dynamic stand balance practice such as ; toe tapping on blue foam 5.balance practice to  be able to put on her pants in standing Patients performance improves with practice and uses UE10%to help support and for balance; frequent rests for SOB                       PT Education - 01/21/18 1401    Education provided  Yes    Education Details  LSVT BIG    Person(s) Educated  Patient    Methods  Explanation    Comprehension  Verbalized understanding          PT Long Term Goals - 12/26/17 0828      PT LONG TERM GOAL #1   Title  Patient will be able to stand with unilateral upper extremity support for > 30  minutes in order to independently perform ADL's.    Time  4    Period  Weeks    Status  New    Target Date  01/22/18      PT LONG TERM GOAL #2   Title  Patient will be able to reach > 12  inches without loss of balance to reduce fall risk with functional activities.    Time  4    Period  Weeks    Status  New    Target Date  01/22/18      PT LONG TERM GOAL #3   Title  Pt will increase 6 minute walk time by 150 feet to improve endurance and aid in improving mobility    Time  4    Period  Weeks    Status  New    Target Date  01/22/18      PT LONG TERM GOAL #4   Title  Patient will be independent with home exercise program for self-management of ankle symptoms/ difficulty.    Time  4    Period  Weeks    Status  New    Target Date  01/22/18            Plan - 01/21/18 1402    Clinical Impression Statement  Patient seen for LSVT Daily Session Maximal Daily Exercises for facilitation/coordination of movement Sustained movements are designed to rescale the amplitude of movement output for generalization to daily functional activities Continues to have balance deficits typical with diagnosis. Patient performs intermediate level exercises without pain behaviors and needs verbal cuing for postural alignment and head positioning. Cuing is needed to stretch the leg out in seated side reaching. Fatigue with sit to stand but demonstrating more  control     Rehab Potential  Good    PT Frequency  4x / week    PT Duration  4 weeks    PT Treatment/Interventions  Therapeutic activities;Therapeutic exercise;Gait training;Balance training;Neuromuscular re-education    PT Next Visit Plan  LSVT BIG    PT Home Exercise Plan  LSVT BIG    Consulted and Agree with Plan of Care  Patient       Patient will benefit from skilled therapeutic intervention in order to improve the following deficits and impairments:  Decreased balance, Decreased endurance, Decreased mobility, Difficulty walking, Decreased coordination, Decreased activity tolerance  Visit Diagnosis: Parkinson disease ([place])  Difficulty walking  Muscle weakness (generalized)  Other lack of coordination  Self-care deficit for hygiene     Problem List Patient Active Problem List   Diagnosis Date Noted  . Liver lesion 01/21/2018  . Low serum vitamin B12 01/21/2018  . Right-sided chest wall pain 01/21/2018  . Chronic fatigue 10/23/2017  . Lesion of left lung 10/23/2017  . Genetic testing 05/01/2017  . Encounter for general adult medical examination with abnormal findings 12/27/2016  . Parkinson disease ([place]) 01/10/2016  . DNR (do not resuscitate) 05/22/2015  . Tremor 02/07/2015  . Dysphagia, pharyngoesophageal phase 02/07/2015  . Hypercalcemia 09/14/2013  . Unspecified vitamin D deficiency 04/27/2013  . Breast cancer of upper-outer quadrant of left female breast ([place]) 09/16/2012  . Osteoporosis, post-menopausal 03/14/2012  . Aortic aneurysm ([place]) 01/01/2011  . OSTEOARTHRITIS, CERVICAL SPINE 08/15/2009  . ALLERGIC RHINITIS 03/25/2008  . Chronic obstructive airway disease with asthma ([place]) 03/25/2008  . BREAST CANCER, HX OF 03/10/2008  . Mixed hyperlipidemia 02/10/2007  . Essential hypertension 02/10/2007    [name] S,PT DPT 01/21/2018, 2:04 PM  [place] MAIN [facility] SERVICES [address] [place], [place],  [address] Phone: [phone]   Fax:  [phone]  Name: Crystal Bass MRN: [id number] Date of Birth: 1940/12/15

## 2018-01-21 NOTE — Assessment & Plan Note (Signed)
Again reviewed with patient - desires to remain DNR.

## 2018-01-21 NOTE — Assessment & Plan Note (Addendum)
Update ultrasound. Presumed benign hemangioma but slightly larger on latest check

## 2018-01-21 NOTE — Assessment & Plan Note (Signed)
Chronic, stable. Continue current regimen. 

## 2018-01-22 ENCOUNTER — Ambulatory Visit: Payer: Medicare Other | Admitting: Physical Therapy

## 2018-01-22 ENCOUNTER — Encounter: Payer: Self-pay | Admitting: Physical Therapy

## 2018-01-22 DIAGNOSIS — G20 Parkinson's disease: Secondary | ICD-10-CM

## 2018-01-22 DIAGNOSIS — R460 Very low level of personal hygiene: Secondary | ICD-10-CM

## 2018-01-22 DIAGNOSIS — R262 Difficulty in walking, not elsewhere classified: Secondary | ICD-10-CM

## 2018-01-22 DIAGNOSIS — Z741 Need for assistance with personal care: Secondary | ICD-10-CM

## 2018-01-22 DIAGNOSIS — M6281 Muscle weakness (generalized): Secondary | ICD-10-CM

## 2018-01-22 DIAGNOSIS — R278 Other lack of coordination: Secondary | ICD-10-CM

## 2018-01-22 NOTE — Therapy (Signed)
[place] MAIN [facility] SERVICES [place], [place], [address] Phone: [phone]   Fax:  [phone]  Physical Therapy Treatment  Patient Details  Name: Crystal Bass MRN: [id number] Date of Birth: [birth year] Referring Provider: Ludwig Clarks    Encounter Date: 01/22/2018  PT End of Session - 01/22/18 1405    Visit Number  12    Number of Visits  17    Date for PT Re-Evaluation  01/29/18    PT Start Time  0200    PT Stop Time  0300    PT Time Calculation (min)  60 min    Equipment Utilized During Treatment  Gait belt    Activity Tolerance  Patient tolerated treatment well;Patient limited by fatigue    Behavior During Therapy  [facility] for tasks assessed/performed       Past Medical History:  Diagnosis Date  . 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 Aortic root aneurysm ([place])   . Breast cancer ([place]) 2002   infiltrative ductal carcinoma   . Cataract 2019  . COPD (chronic obstructive pulmonary disease) ([place]) 9/08  . Ductal carcinoma of breast, estrogen receptor positive, stage 1 ([place]) 09/16/2012  . Endometriosis   . Hypercalcemia 09/14/2013  . Hypertension   . Lesion of breast 1992   right, benign  . Lung disease    secondary to [name] infection  . Mastalgia 7/95  . Osteoporosis, post-menopausal 03/14/2012  . Ovarian cancer ([place]) 2004  . Pancreatitis    secondary to Cholelithiasis  . Parkinson disease ([place]) 02/10/15  . Post-thoracotomy pain syndrome   . Pseudomonas pneumonia ([place]) 5/09  . Uterine fibroid   . Vitamin D deficiency     Past Surgical History:  Procedure Laterality Date  . 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 APPENDECTOMY  2004  . BREAST BIOPSY  08/2004   right breast-benign  . BREAST LUMPECTOMY  1992   benign  . CARDIAC CATHETERIZATION  2007   essentially negation for significant CAD  . CHOLECYSTECTOMY  2001  . COLONOSCOPY   2014   Dr [name] , due 2019  . LUNG BIOPSY  2002   [name], Dr [name]  . MASTECTOMY MODIFIED RADICAL Left 2002   oral chemotheraphy (tamoxifen then Armidex) no radiation, Dr.[name]  . TONSILLECTOMY  1946    There were no vitals filed for this visit.  Subjective Assessment - 01/22/18 1404    Subjective  Patient reports that she is having a much better day today.     Limitations  Standing;Walking;Writing    Currently in Pain?  No/denies    Pain Score  0-No pain    Pain Onset  Today         Patient seen for LSVT Daily Session Maximal Daily Exercises for facilitation/coordination of movement Sustained movements are designed to rescale the amplitude of movement output for generalization to daily functional activities .Performed as follows for 1 set of 10 repetitions each multidirectional sustained movements  1) Floor to ceiling, cues to reach fwd more 2) Side to side multidirectional Repetitive movements performed in standing and are designed to provide retraining effort needed for sustained muscle activation in tasks  3) Step and reach forward , cue to finish the exercise 4) Step and reach backwards, cues to bring both UE back and lift toe, has difficulty with switching feet 5)  Step and reach sideways, needs cues to turn his head 6) Rock and reach forward/backward , cues to keep feet from moving fwd 7) Rock and reach sideways, cues to twist big VC for all above exercises and demonstration for rock backwards step and for twist exercise.  Sit to stand functional component task with supervision 5 reps and simulated activities for: 1.Sit to stand functional component task with supervision 5 reps and simulated activities for: 2.hand coordination tasks such as buttons, writing 3.Hand strengthening tasks 4. dynamic stand balance practice such as ; toe tapping on blue foam 5.balance practice to be able to put on her pants in standing Patients performance improves with practice  and uses UE10%to help support and for balance; frequent rests for SOB.                      PT Education - 01/22/18 1404    Education provided  Yes    Education Details  LSVT BIG    Person(s) Educated  Patient    Methods  Explanation;Demonstration    Comprehension  Verbalized understanding;Returned demonstration;Verbal cues required          PT Long Term Goals - 12/26/17 0828      PT LONG TERM GOAL #1   Title  Patient will be able to stand with unilateral upper extremity support for > 30  minutes in order to independently perform ADL's.    Time  4    Period  Weeks    Status  New    Target Date  01/22/18      PT LONG TERM GOAL #2   Title  Patient will be able to reach > 12  inches without loss of balance to reduce fall risk with functional activities.    Time  4    Period  Weeks    Status  New    Target Date  01/22/18      PT LONG TERM GOAL #3   Title  Pt will increase 6 minute walk time by 150 feet to improve endurance and aid in improving mobility    Time  4    Period  Weeks    Status  New    Target Date  01/22/18      PT LONG TERM GOAL #4   Title  Patient will be independent with home exercise program for self-management of ankle symptoms/ difficulty.    Time  4    Period  Weeks    Status  New    Target Date  01/22/18            Plan - 01/22/18 1406    Clinical Impression Statement  Min cueing needed to appropriately perform LSVT tasks with leg, hand, and head position. Decreased coordination demonstrated requiring consistent verbal cueing to correct form. Cognitive understanding of task was mildly delayed. Patient continues to demonstrate some in coordination of movement with select exercises such as rock and reach and stepping backwards. Patient responds well to verbal and tactile cues to correct form and technique. CGA to SBA for safety with activities. Cues to increase intensity and amplitude of movements throughout session    Rehab  Potential  Good    PT Frequency  4x / week    PT Duration  4 weeks    PT Treatment/Interventions  Therapeutic activities;Therapeutic exercise;Gait training;Balance training;Neuromuscular re-education    PT Next Visit Plan  LSVT BIG    PT Home Exercise Plan  LSVT BIG  Consulted and Agree with Plan of Care  Patient       Patient will benefit from skilled therapeutic intervention in order to improve the following deficits and impairments:  Decreased balance, Decreased endurance, Decreased mobility, Difficulty walking, Decreased coordination, Decreased activity tolerance  Visit Diagnosis: Parkinson disease ([place])  Difficulty walking  Muscle weakness (generalized)  Other lack of coordination  Self-care deficit for hygiene     Problem List Patient Active Problem List   Diagnosis Date Noted  . Liver lesion 01/21/2018  . Low serum vitamin B12 01/21/2018  . Right-sided chest wall pain 01/21/2018  . Chronic fatigue 10/23/2017  . Lesion of left lung 10/23/2017  . Genetic testing 05/01/2017  . Encounter for general adult medical examination with abnormal findings 12/27/2016  . Parkinson disease ([place]) 01/10/2016  . DNR (do not resuscitate) 05/22/2015  . Tremor 02/07/2015  . Dysphagia, pharyngoesophageal phase 02/07/2015  . Hypercalcemia 09/14/2013  . Unspecified vitamin D deficiency 04/27/2013  . Breast cancer of upper-outer quadrant of left female breast ([place]) 09/16/2012  . Osteoporosis, post-menopausal 03/14/2012  . Aortic aneurysm ([place]) 01/01/2011  . OSTEOARTHRITIS, CERVICAL SPINE 08/15/2009  . ALLERGIC RHINITIS 03/25/2008  . Chronic obstructive airway disease with asthma ([place]) 03/25/2008  . BREAST CANCER, HX OF 03/10/2008  . Mixed hyperlipidemia 02/10/2007  . Essential hypertension 02/10/2007    Alanson Puls 01/22/2018, 2:08 PM  [place] MAIN [facility] SERVICES [address] [place], [place], [address] Phone: [phone]    Fax:  [phone]  Name: ACQUANETTA CABANILLA MRN: [id number] Date of Birth: 10-Sep-1941

## 2018-01-23 ENCOUNTER — Encounter: Payer: Self-pay | Admitting: Physical Therapy

## 2018-01-23 ENCOUNTER — Ambulatory Visit: Payer: Medicare Other | Admitting: Physical Therapy

## 2018-01-23 DIAGNOSIS — G20 Parkinson's disease: Secondary | ICD-10-CM | POA: Diagnosis not present

## 2018-01-23 DIAGNOSIS — M6281 Muscle weakness (generalized): Secondary | ICD-10-CM

## 2018-01-23 DIAGNOSIS — R460 Very low level of personal hygiene: Secondary | ICD-10-CM

## 2018-01-23 DIAGNOSIS — R278 Other lack of coordination: Secondary | ICD-10-CM

## 2018-01-23 DIAGNOSIS — R262 Difficulty in walking, not elsewhere classified: Secondary | ICD-10-CM

## 2018-01-23 DIAGNOSIS — Z741 Need for assistance with personal care: Secondary | ICD-10-CM

## 2018-01-23 NOTE — Therapy (Signed)
[place] MAIN [facility] SERVICES [place], [place], [address] Phone: [phone]   Fax:  [phone]  Physical Therapy Treatment  Patient Details  Name: Crystal Bass MRN: [id number] Date of Birth: 03-18-1941 Referring Provider: Ludwig Clarks    Encounter Date: 01/23/2018  PT End of Session - 01/23/18 1406    Visit Number  13    Number of Visits  17    Date for PT Re-Evaluation  01/29/18    PT Start Time  0200    PT Stop Time  0300    PT Time Calculation (min)  60 min    Equipment Utilized During Treatment  Gait belt    Activity Tolerance  Patient tolerated treatment well;Patient limited by fatigue    Behavior During Therapy  [facility] for tasks assessed/performed       Past Medical History:  Diagnosis Date  . 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 Aortic root aneurysm ([place])   . Breast cancer ([place]) 2002   infiltrative ductal carcinoma   . Cataract 2019  . COPD (chronic obstructive pulmonary disease) ([place]) 9/08  . Ductal carcinoma of breast, estrogen receptor positive, stage 1 ([place]) 09/16/2012  . Endometriosis   . Hypercalcemia 09/14/2013  . Hypertension   . Lesion of breast 1992   right, benign  . Lung disease    secondary to [name] infection  . Mastalgia 7/95  . Osteoporosis, post-menopausal 03/14/2012  . Ovarian cancer ([place]) 2004  . Pancreatitis    secondary to Cholelithiasis  . Parkinson disease ([place]) 02/10/15  . Post-thoracotomy pain syndrome   . Pseudomonas pneumonia ([place]) 5/09  . Uterine fibroid   . Vitamin D deficiency     Past Surgical History:  Procedure Laterality Date  . 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 APPENDECTOMY  2004  . BREAST BIOPSY  08/2004   right breast-benign  . BREAST LUMPECTOMY  1992   benign  . CARDIAC CATHETERIZATION  2007   essentially negation for significant CAD  . CHOLECYSTECTOMY  2001  . COLONOSCOPY   2014   Dr [name] , due 2019  . LUNG BIOPSY  2002   [name], Dr [name]  . MASTECTOMY MODIFIED RADICAL Left 2002   oral chemotheraphy (tamoxifen then Armidex) no radiation, Dr.[name]  . TONSILLECTOMY  1946    There were no vitals filed for this visit.  Subjective Assessment - 01/23/18 1404    Subjective  Patient reports that she is having a head ache today.    Limitations  Standing;Walking;Writing    Currently in Pain?  Yes    Pain Score  3     Pain Location  Head    Pain Orientation  Upper    Pain Descriptors / Indicators  Aching    Pain Onset  Today    Pain Frequency  Intermittent    Multiple Pain Sites  No        Patient seen for LSVT Daily Session Maximal Daily Exercises for facilitation/coordination of movement Sustained movements are designed to rescale the amplitude of movement output for generalization to daily functional activities .Performed as follows for 1 set of 10 repetitions each multidirectional sustained movements  1) Floor to ceiling, cues to reach fwd more 2) Side to side multidirectional Repetitive movements performed in standing and are designed to provide retraining effort needed for sustained muscle  activation in tasks  3) Step and reach forward , cue to finish the exercise 4) Step and reach backwards, cues to bring both UE back and lift toe, has difficulty with switching feet 5) Step and reach sideways, needs cues to turn his head 6) Rock and reach forward/backward , cues to keep feet from moving fwd 7) Rock and reach sideways, cues to twist big VC for all above exercises and demonstration for rock backwards step and for twist exercise.  Sit to stand functional component task with supervision 5 reps and simulated activities for: 1.Sit to stand functional component task with supervision 5 reps and simulated activities for: 2.hand coordination tasks such as buttons, writing 3.Hand strengthening tasks 4. dynamic stand balance practice such as ; toe  tapping on blue foam 5.balance practice to be able to put on her pants in standing Patients performance improves with practice and uses UE10%to help support and for balance; frequent rests for SOB.                       PT Education - 01/23/18 1406    Education provided  Yes    Education Details  LSVY BIG    Person(s) Educated  Patient    Methods  Explanation;Demonstration;Verbal cues    Comprehension  Verbalized understanding;Returned demonstration;Verbal cues required          PT Long Term Goals - 12/26/17 0828      PT LONG TERM GOAL #1   Title  Patient will be able to stand with unilateral upper extremity support for > 30  minutes in order to independently perform ADL's.    Time  4    Period  Weeks    Status  New    Target Date  01/22/18      PT LONG TERM GOAL #2   Title  Patient will be able to reach > 12  inches without loss of balance to reduce fall risk with functional activities.    Time  4    Period  Weeks    Status  New    Target Date  01/22/18      PT LONG TERM GOAL #3   Title  Pt will increase 6 minute walk time by 150 feet to improve endurance and aid in improving mobility    Time  4    Period  Weeks    Status  New    Target Date  01/22/18      PT LONG TERM GOAL #4   Title  Patient will be independent with home exercise program for self-management of ankle symptoms/ difficulty.    Time  4    Period  Weeks    Status  New    Target Date  01/22/18            Plan - 01/23/18 1407    Clinical Impression Statement  Pt responded well to all interventions today.  Focus on core and hip strengthening in order to improve balance and ambulation.  Pt was able to progress dynamic balance exercises today, noting improved postural reactions with LOB and moving outside normal BOS.  Pt was able to perform all exercises on uneven surfaces with minimal LOB noted.  Patient will continue to benefit from skilled PT to improve mobility and gait.      Rehab Potential  Good    PT Frequency  4x / week    PT Duration  4 weeks    PT  Treatment/Interventions  Therapeutic activities;Therapeutic exercise;Gait training;Balance training;Neuromuscular re-education    PT Next Visit Plan  LSVT BIG    PT Home Exercise Plan  LSVT BIG    Consulted and Agree with Plan of Care  Patient       Patient will benefit from skilled therapeutic intervention in order to improve the following deficits and impairments:  Decreased balance, Decreased endurance, Decreased mobility, Difficulty walking, Decreased coordination, Decreased activity tolerance  Visit Diagnosis: Parkinson disease ([place])  Difficulty walking  Muscle weakness (generalized)  Other lack of coordination  Self-care deficit for hygiene     Problem List Patient Active Problem List   Diagnosis Date Noted  . Liver lesion 01/21/2018  . Low serum vitamin B12 01/21/2018  . Right-sided chest wall pain 01/21/2018  . Chronic fatigue 10/23/2017  . Lesion of left lung 10/23/2017  . Genetic testing 05/01/2017  . Encounter for general adult medical examination with abnormal findings 12/27/2016  . Parkinson disease ([place]) 01/10/2016  . DNR (do not resuscitate) 05/22/2015  . Tremor 02/07/2015  . Dysphagia, pharyngoesophageal phase 02/07/2015  . Hypercalcemia 09/14/2013  . Unspecified vitamin D deficiency 04/27/2013  . Breast cancer of upper-outer quadrant of left female breast ([place]) 09/16/2012  . Osteoporosis, post-menopausal 03/14/2012  . Aortic aneurysm ([place]) 01/01/2011  . OSTEOARTHRITIS, CERVICAL SPINE 08/15/2009  . ALLERGIC RHINITIS 03/25/2008  . Chronic obstructive airway disease with asthma ([place]) 03/25/2008  . BREAST CANCER, HX OF 03/10/2008  . Mixed hyperlipidemia 02/10/2007  . Essential hypertension 02/10/2007    Alanson Puls, [place] DPT 01/23/2018, 2:09 PM  [place] MAIN [facility] SERVICES [address] [place], [place],  [address] Phone: [phone]   Fax:  [phone]  Name: MEHA VIDRINE MRN: [id number] Date of Birth: 01-25-1941

## 2018-01-26 ENCOUNTER — Encounter: Payer: Self-pay | Admitting: Family Medicine

## 2018-01-26 DIAGNOSIS — Z7189 Other specified counseling: Secondary | ICD-10-CM | POA: Insufficient documentation

## 2018-01-27 ENCOUNTER — Ambulatory Visit: Payer: Medicare Other | Attending: Neurology | Admitting: Physical Therapy

## 2018-01-27 ENCOUNTER — Other Ambulatory Visit (INDEPENDENT_AMBULATORY_CARE_PROVIDER_SITE_OTHER): Payer: Medicare Other

## 2018-01-27 ENCOUNTER — Encounter: Payer: Self-pay | Admitting: Physical Therapy

## 2018-01-27 DIAGNOSIS — R262 Difficulty in walking, not elsewhere classified: Secondary | ICD-10-CM | POA: Diagnosis present

## 2018-01-27 DIAGNOSIS — G20 Parkinson's disease: Secondary | ICD-10-CM

## 2018-01-27 DIAGNOSIS — M6281 Muscle weakness (generalized): Secondary | ICD-10-CM | POA: Diagnosis present

## 2018-01-27 DIAGNOSIS — C569 Malignant neoplasm of unspecified ovary: Secondary | ICD-10-CM

## 2018-01-27 DIAGNOSIS — Z741 Need for assistance with personal care: Secondary | ICD-10-CM

## 2018-01-27 DIAGNOSIS — R460 Very low level of personal hygiene: Secondary | ICD-10-CM | POA: Diagnosis present

## 2018-01-27 DIAGNOSIS — R278 Other lack of coordination: Secondary | ICD-10-CM | POA: Insufficient documentation

## 2018-01-27 NOTE — Therapy (Signed)
[place] MAIN [facility] SERVICES [address] [place], [place], [address] Phone: [phone]   Fax:  [phone]  Physical Therapy Treatment  Patient Details  Name: Crystal Bass MRN: [id number] Date of Birth: [birth year] Referring Provider: Ludwig Clarks    Encounter Date: 01/27/2018  PT End of Session - 01/27/18 1417    Visit Number  14    Number of Visits  17    Date for PT Re-Evaluation  01/29/18    PT Start Time  0205    PT Stop Time  0300    PT Time Calculation (min)  55 min    Equipment Utilized During Treatment  Gait belt    Activity Tolerance  Patient tolerated treatment well;Patient limited by fatigue    Behavior During Therapy  [facility] for tasks assessed/performed       Past Medical History:  Diagnosis Date  . 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 Aortic root aneurysm ([place])   . Breast cancer ([place]) 2002   infiltrative ductal carcinoma   . Cataract 2019  . COPD (chronic obstructive pulmonary disease) ([place]) 9/08  . Ductal carcinoma of breast, estrogen receptor positive, stage 1 ([place]) 09/16/2012  . Endometriosis   . Hypercalcemia 09/14/2013  . Hypertension   . Lesion of breast 1992   right, benign  . Lung disease    secondary to [name] infection  . Mastalgia 7/95  . Osteoporosis, post-menopausal 03/14/2012  . Ovarian cancer ([place]) 2004  . Pancreatitis    secondary to Cholelithiasis  . Parkinson disease ([place]) 02/10/15  . Post-thoracotomy pain syndrome   . Pseudomonas pneumonia ([place]) 5/09  . Uterine fibroid   . Vitamin D deficiency     Past Surgical History:  Procedure Laterality Date  . 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 APPENDECTOMY  2004  . BREAST BIOPSY  08/2004   right breast-benign  . BREAST LUMPECTOMY  1992   benign  . CARDIAC CATHETERIZATION  2007   essentially negation for significant CAD  . CHOLECYSTECTOMY  2001  . COLONOSCOPY   2014   Dr [name] , due 2019  . LUNG BIOPSY  2002   [name], Dr [name]  . MASTECTOMY MODIFIED RADICAL Left 2002   oral chemotheraphy (tamoxifen then Armidex) no radiation, Dr.[name]  . TONSILLECTOMY  1946    There were no vitals filed for this visit.  Subjective Assessment - 01/27/18 1416    Subjective  Patient reports that she is feeling better today.    Limitations  Standing;Walking;Writing    Currently in Pain?  No/denies    Pain Score  0-No pain    Pain Onset  Today       Patient seen for LSVT Daily Session Maximal Daily Exercises for facilitation/coordination of movement Sustained movements are designed to rescale the amplitude of movement output for generalization to daily functional activities .Performed as follows for 1 set of 10 repetitions each multidirectional sustained movements  1) Floor to ceiling, cues to reach fwd more 2) Side to side, cues to pronate UE towards the ceiling  multidirectional Repetitive movements performed in standing and are designed to provide retraining effort needed for sustained muscle activation in tasks  3) Step and reach forward , cue to finish the exercise with hand on her legs 4) Step and reach backwards, cues to bring both UE back and lift  toe, has difficulty with switching feet 5) Step and reach sideways, needs cues to turn his head and step at the same time 6) Rock and reach forward/backward , cues to keep feet from moving fwd 7) Rock and reach sideways, cues to twist big VC for all above exercises and demonstration for rock backwards step and for twist exercise.  Sit to stand functional component task with supervision 5 reps and simulated activities for: 1.Sit to stand functional component task with supervision 5 reps and simulated activities for: 2.hand coordination tasks such as cards, manipulating small items like pegs into a peg board, clothes pins, coin counter 3.Hand strengthening tasks 4. dynamic stand balance practice  such as ; toe tapping on blue foam up to 2 steps balancing on one LE 5.balance practice to be able to put on her pants in standing Patients performance improves with practice and uses UE10%to help support and for balance; frequent rests for SOB                       PT Education - 01/27/18 1416    Education provided  Yes    Education Details  LSVT BIG    Person(s) Educated  Patient    Methods  Explanation;Demonstration;Verbal cues    Comprehension  Verbalized understanding;Returned demonstration          PT Long Term Goals - 12/26/17 0828      PT LONG TERM GOAL #1   Title  Patient will be able to stand with unilateral upper extremity support for > 30  minutes in order to independently perform ADL's.    Time  4    Period  Weeks    Status  New    Target Date  01/22/18      PT LONG TERM GOAL #2   Title  Patient will be able to reach > 12  inches without loss of balance to reduce fall risk with functional activities.    Time  4    Period  Weeks    Status  New    Target Date  01/22/18      PT LONG TERM GOAL #3   Title  Pt will increase 6 minute walk time by 150 feet to improve endurance and aid in improving mobility    Time  4    Period  Weeks    Status  New    Target Date  01/22/18      PT LONG TERM GOAL #4   Title  Patient will be independent with home exercise program for self-management of ankle symptoms/ difficulty.    Time  4    Period  Weeks    Status  New    Target Date  01/22/18            Plan - 01/27/18 1418    Clinical Impression Statement  . Patient responds well to verbal and tactile cues to correct form and technique.  CGA to SBA for safety with activities.  Cues to increase intensity and amplitude of movements throughout sessionPatient continues to demonstrate less incoordination of movement with select exercises such as rock and reach and stepping backwards. . Patient will benefit from skilled PT in order to increase gait  speed, increase BLE strength, and improve dynamic standing balance to decrease risk for falls and enable patient to participate in desired activities.    Rehab Potential  Good    PT Frequency  4x / week  PT Duration  4 weeks    PT Treatment/Interventions  Therapeutic activities;Therapeutic exercise;Gait training;Balance training;Neuromuscular re-education    PT Next Visit Plan  LSVT BIG    PT Home Exercise Plan  LSVT BIG    Consulted and Agree with Plan of Care  Patient       Patient will benefit from skilled therapeutic intervention in order to improve the following deficits and impairments:  Decreased balance, Decreased endurance, Decreased mobility, Difficulty walking, Decreased coordination, Decreased activity tolerance  Visit Diagnosis: Parkinson disease ([place])  Difficulty walking  Muscle weakness (generalized)  Other lack of coordination  Self-care deficit for hygiene     Problem List Patient Active Problem List   Diagnosis Date Noted  . Advanced care planning/counseling discussion 01/26/2018  . Liver lesion 01/21/2018  . Low serum vitamin B12 01/21/2018  . Right-sided chest wall pain 01/21/2018  . Chronic fatigue 10/23/2017  . Lesion of left lung 10/23/2017  . Genetic testing 05/01/2017  . Encounter for general adult medical examination with abnormal findings 12/27/2016  . Parkinson disease ([place]) 01/10/2016  . DNR (do not resuscitate) 05/22/2015  . Tremor 02/07/2015  . Dysphagia, pharyngoesophageal phase 02/07/2015  . Hypercalcemia 09/14/2013  . Unspecified vitamin D deficiency 04/27/2013  . Breast cancer of upper-outer quadrant of left female breast ([place]) 09/16/2012  . Osteoporosis, post-menopausal 03/14/2012  . Aortic aneurysm ([place]) 01/01/2011  . OSTEOARTHRITIS, CERVICAL SPINE 08/15/2009  . ALLERGIC RHINITIS 03/25/2008  . Chronic obstructive airway disease with asthma ([place]) 03/25/2008  . BREAST CANCER, HX OF 03/10/2008  . Mixed hyperlipidemia 02/10/2007   . Essential hypertension 02/10/2007    Alanson Puls, PT DPT 01/27/2018, 2:26 PM  [place] MAIN [facility] SERVICES [address] [place], [place], [address] Phone: [phone]   Fax:  [phone]  Name: KAELI NICHELSON MRN: [id number] Date of Birth: 06-04-1941

## 2018-01-28 ENCOUNTER — Encounter: Payer: Self-pay | Admitting: Physical Therapy

## 2018-01-28 ENCOUNTER — Ambulatory Visit: Payer: Medicare Other | Admitting: Physical Therapy

## 2018-01-28 DIAGNOSIS — Z741 Need for assistance with personal care: Secondary | ICD-10-CM

## 2018-01-28 DIAGNOSIS — R278 Other lack of coordination: Secondary | ICD-10-CM

## 2018-01-28 DIAGNOSIS — G20 Parkinson's disease: Secondary | ICD-10-CM

## 2018-01-28 DIAGNOSIS — R262 Difficulty in walking, not elsewhere classified: Secondary | ICD-10-CM

## 2018-01-28 DIAGNOSIS — R460 Very low level of personal hygiene: Secondary | ICD-10-CM

## 2018-01-28 DIAGNOSIS — M6281 Muscle weakness (generalized): Secondary | ICD-10-CM

## 2018-01-28 LAB — CA 125: CANCER ANTIGEN (CA) 125: 35.1 U/mL (ref 0.0–38.1)

## 2018-01-28 NOTE — Therapy (Signed)
[place] MAIN [facility] SERVICES [address] [place], [place], [address] Phone: [phone]   Fax:  [phone]  Physical Therapy Treatment  Patient Details  Name: Crystal Bass MRN: [id number] Date of Birth: December 09, 1940 Referring Provider: Ludwig Clarks    Encounter Date: 01/28/2018  PT End of Session - 01/28/18 1420    Visit Number  15    Number of Visits  17    Date for PT Re-Evaluation  01/29/18    PT Start Time  0205    PT Stop Time  0300    PT Time Calculation (min)  55 min    Equipment Utilized During Treatment  Gait belt    Activity Tolerance  Patient tolerated treatment well;Patient limited by fatigue    Behavior During Therapy  [facility] for tasks assessed/performed       Past Medical History:  Diagnosis Date  . 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 Aortic root aneurysm ([place])   . Breast cancer ([place]) 2002   infiltrative ductal carcinoma   . Cataract 2019  . COPD (chronic obstructive pulmonary disease) ([place]) 9/08  . Ductal carcinoma of breast, estrogen receptor positive, stage 1 ([place]) 09/16/2012  . Endometriosis   . Hypercalcemia 09/14/2013  . Hypertension   . Lesion of breast 1992   right, benign  . Lung disease    secondary to [name] infection  . Mastalgia 7/95  . Osteoporosis, post-menopausal 03/14/2012  . Ovarian cancer ([place]) 2004  . Pancreatitis    secondary to Cholelithiasis  . Parkinson disease ([place]) 02/10/15  . Post-thoracotomy pain syndrome   . Pseudomonas pneumonia ([place]) 5/09  . Uterine fibroid   . Vitamin D deficiency     Past Surgical History:  Procedure Laterality Date  . 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 APPENDECTOMY  2004  . BREAST BIOPSY  08/2004   right breast-benign  . BREAST LUMPECTOMY  1992   benign  . CARDIAC CATHETERIZATION  2007   essentially negation for significant CAD  . CHOLECYSTECTOMY  2001  . COLONOSCOPY   2014   Dr [name] , due 2019  . LUNG BIOPSY  2002   [name], Dr [name]  . MASTECTOMY MODIFIED RADICAL Left 2002   oral chemotheraphy (tamoxifen then Armidex) no radiation, Dr.[name]  . TONSILLECTOMY  1946    There were no vitals filed for this visit.  Subjective Assessment - 01/28/18 1419    Subjective  Patient reports that she is feeling short of breath today.    Limitations  Standing;Walking;Writing    Currently in Pain?  No/denies    Pain Score  0-No pain    Pain Onset  Today       eval results OUTCOME MEASURES: TEST Outcome Interpretation  5 times sit<>stand 12.75sec >62 yo, >15 sec indicates increased risk for falls  10 meter walk test 1.31m/s <1.0 m/s indicates increased risk for falls; limited community ambulator  Timed up and Go 6.52sec <14 sec indicates increased risk for falls  6 minute walk test 1045Feet 1000 feet is community ambulator      [address] Peg Test L:20.57R: 20.96    Final results  OUTCOME MEASURES: TEST Outcome Interpretation  5 times sit<>stand 8.53 sec >60 yo, >15 sec indicates increased risk for falls  10 meter walk test 1.107m/s <1.0 m/s indicates increased risk for falls; limited community  ambulator  Timed up and Go 5.76sec <14 sec indicates increased risk for falls  6 minute walk test 1450Feet 1000 feet is community ambulator      [address] Peg Test L:19.44R: 17.81  < 20 sec is WFL   eval results OUTCOME MEASURES: TEST Outcome Interpretation  5 times sit<>stand 6. 86 sec >60 yo, >15 sec indicates increased risk for falls  10 meter walk test 1.19m/s <1.0 m/s indicates increased risk for falls; limited community ambulator  Timed up and Go  5.79 sec <14 sec indicates increased risk for falls  6 minute walk test 1225Feet 1000 feet is community ambulator      [address] Peg Test  L:18.80R: 17.58 <20 is wfl   Patient performs above outcome measures with progress made in all areas except 6 MW test and patient has difficulty with her breathing today which is a factor in the results decreasing.                        PT Education - 01/28/18 1419    Education provided  Yes    Education Details  outcome meausres    Person(s) Educated  Patient    Methods  Explanation;Demonstration    Comprehension  Verbalized understanding;Returned demonstration          PT Long Term Goals - 01/28/18 1505      PT LONG TERM GOAL #1   Title  Patient will be able to stand with unilateral upper extremity support for > 30  minutes in order to independently perform ADL's.    Time  4    Period  Weeks    Status  Achieved    Target Date  01/28/18      PT LONG TERM GOAL #2   Title  Patient will be able to reach > 12  inches without loss of balance to reduce fall risk with functional activities.    Time  4    Period  Weeks    Status  Achieved    Target Date  01/28/18      PT LONG TERM GOAL #3   Title  Pt will increase 6 minute walk time by 150 feet to improve endurance and aid in improving mobility    Time  4    Period  Weeks    Status  Achieved    Target Date  01/28/18      PT LONG TERM GOAL #4   Title  Patient will be independent with home exercise program for self-management of ankle symptoms/ difficulty.    Time  4    Period  Weeks    Status  Achieved    Target Date  01/28/18            Plan - 01/28/18 1503    Clinical Impression Statement  Patient performs outcome measures and makes progress in all goals except 6 MW test partially due to shortness of breath with exertion. She was educated on her results and importance of future HEP and will be discharged to HEP.     Rehab Potential  Good    PT Frequency  4x / week    PT Duration  4 weeks    PT Treatment/Interventions  Therapeutic activities;Therapeutic exercise;Gait  training;Balance training;Neuromuscular re-education    PT Next Visit Plan  LSVT BIG    PT Home Exercise Plan  LSVT BIG    Consulted and Agree with Plan of Care  Patient  Patient will benefit from skilled therapeutic intervention in order to improve the following deficits and impairments:  Decreased balance, Decreased endurance, Decreased mobility, Difficulty walking, Decreased coordination, Decreased activity tolerance  Visit Diagnosis: Parkinson disease ([place])  Difficulty walking  Muscle weakness (generalized)  Other lack of coordination  Self-care deficit for hygiene     Problem List Patient Active Problem List   Diagnosis Date Noted  . Advanced care planning/counseling discussion 01/26/2018  . Liver lesion 01/21/2018  . Low serum vitamin B12 01/21/2018  . Right-sided chest wall pain 01/21/2018  . Chronic fatigue 10/23/2017  . Lesion of left lung 10/23/2017  . Genetic testing 05/01/2017  . Encounter for general adult medical examination with abnormal findings 12/27/2016  . Parkinson disease ([place]) 01/10/2016  . DNR (do not resuscitate) 05/22/2015  . Tremor 02/07/2015  . Dysphagia, pharyngoesophageal phase 02/07/2015  . Hypercalcemia 09/14/2013  . Unspecified vitamin D deficiency 04/27/2013  . Breast cancer of upper-outer quadrant of left female breast ([place]) 09/16/2012  . Osteoporosis, post-menopausal 03/14/2012  . Aortic aneurysm ([place]) 01/01/2011  . OSTEOARTHRITIS, CERVICAL SPINE 08/15/2009  . ALLERGIC RHINITIS 03/25/2008  . Chronic obstructive airway disease with asthma ([place]) 03/25/2008  . BREAST CANCER, HX OF 03/10/2008  . Mixed hyperlipidemia 02/10/2007  . Essential hypertension 02/10/2007    Alanson Puls , PT DPT [place] MAIN [facility] SERVICES [address] [place], [place], [address] Phone: [phone]   Fax:  [phone]  Name: SANAI FRICK MRN: [id number] Date of Birth: 1941-01-25

## 2018-01-31 ENCOUNTER — Telehealth: Payer: Self-pay

## 2018-01-31 ENCOUNTER — Ambulatory Visit
Admission: RE | Admit: 2018-01-31 | Discharge: 2018-01-31 | Disposition: A | Discharge status: Home / Self Care | Payer: Medicare Other | Source: Ambulatory Visit | Attending: Family Medicine | Admitting: Family Medicine

## 2018-01-31 DIAGNOSIS — R971 Elevated cancer antigen 125 [CA 125]: Secondary | ICD-10-CM

## 2018-01-31 DIAGNOSIS — K769 Liver disease, unspecified: Secondary | ICD-10-CM

## 2018-01-31 NOTE — Telephone Encounter (Signed)
Spoke with patient. Results given. Patient states that she was having abdominal pain and had a complete abdominal ultrasound this morning with her PCP and has not received results. Asking if can hold on CT until ultrasound results are back in case this shows anything. Advised will review with Dr.Miller and return call.

## 2018-01-31 NOTE — Telephone Encounter (Signed)
She does need pelvic imaging but ok to wait for results.

## 2018-01-31 NOTE — Telephone Encounter (Signed)
Left message to call Kaitlyn at [phone]. 

## 2018-01-31 NOTE — Telephone Encounter (Signed)
-----   Message from Megan Salon, MD sent at 01/30/2018  3:14 PM EST ----- Please let pt know her her ca-125 is 35.  Was 58.  Was 15 before that.  I did check with gyn-onc about this and we both agree CT scan is a good idea at this point.  Needs CT of abdomen and pelvic due to elevated tumor market and hx of borderline ovarian cancer in the past.  Thanks.

## 2018-02-02 ENCOUNTER — Encounter: Payer: Self-pay | Admitting: Family Medicine

## 2018-02-02 DIAGNOSIS — N281 Cyst of kidney, acquired: Secondary | ICD-10-CM | POA: Insufficient documentation

## 2018-02-03 NOTE — Telephone Encounter (Signed)
Left message to call Loretta Kluender at [phone]. 

## 2018-02-03 NOTE — Telephone Encounter (Signed)
Spoke with patient. Patient states that she would like to proceed with CT scan based on recommendations. Order placed for CT abdomen and pelvis with without contrast to [facility]. Patient is aware [facility] will contact her to schedule this appointment.   Cc: Lerry Liner for pre-authorization.

## 2018-02-05 ENCOUNTER — Other Ambulatory Visit: Payer: Self-pay | Admitting: *Deleted

## 2018-02-05 ENCOUNTER — Telehealth: Payer: Self-pay | Admitting: *Deleted

## 2018-02-05 DIAGNOSIS — R971 Elevated cancer antigen 125 [CA 125]: Secondary | ICD-10-CM

## 2018-02-05 NOTE — Telephone Encounter (Signed)
Miriam calling from [place], requesting new order for CT abdomen pelvis with contrast.   New order placed for CT abdomen pelvis with contrast  Routing to Dr. Sabra Heck will close encounter.

## 2018-02-06 ENCOUNTER — Other Ambulatory Visit: Payer: Self-pay | Admitting: *Deleted

## 2018-02-07 NOTE — Telephone Encounter (Signed)
Patient is scheduled for CT abdomen pelvis w contrast on 02/19/2018 at 3:40 pm at [place].  Routing to provider for final review. Patient agreeable to disposition. Will close encounter.

## 2018-02-17 ENCOUNTER — Other Ambulatory Visit: Payer: Medicare Other

## 2018-02-19 ENCOUNTER — Ambulatory Visit
Admission: RE | Admit: 2018-02-19 | Discharge: 2018-02-19 | Disposition: A | Discharge status: Home / Self Care | Payer: Medicare Other | Source: Ambulatory Visit | Attending: Obstetrics & Gynecology | Admitting: Obstetrics & Gynecology

## 2018-02-19 ENCOUNTER — Other Ambulatory Visit: Payer: Medicare Other

## 2018-02-19 DIAGNOSIS — R971 Elevated cancer antigen 125 [CA 125]: Secondary | ICD-10-CM

## 2018-02-19 MED ORDER — IOPAMIDOL (ISOVUE-300) INJECTION 61%
100.0000 mL | Freq: Once | INTRAVENOUS | Status: AC | PRN
  Administered 2018-02-19: 100 mL via INTRAVENOUS

## 2018-02-24 ENCOUNTER — Telehealth: Payer: Self-pay | Admitting: Obstetrics & Gynecology

## 2018-02-24 NOTE — Telephone Encounter (Signed)
Patient has a question a test she had last week.

## 2018-02-24 NOTE — Telephone Encounter (Signed)
Spoke with patient, requesting CT results dated 02/19/18. Advised Dr. Sabra Heck needs to review CT results, will return call once reviewed, is in surgery, response may not be immediate. Patient verbalizes understanding.  Dr. Sabra Heck -please review CT results and advise.

## 2018-02-24 NOTE — Telephone Encounter (Signed)
Called pt and spoke with her personally regarding results.  Ok to close encounter.

## 2018-02-25 NOTE — Progress Notes (Signed)
Crystal Bass was seen today in the movement disorders clinic for neurologic consultation at the request of [name], MD.  The consultation is for the evaluation of tremor.  Pt states that it has been going on over a year.  It is mostly in the R hand and rarely in the L hand.  "It is almost constant but not quite."  She notices it when grasping the steering wheel.  Eating was troublesome but better now that d/c the symbicort.  Symbicort worsened tremor by 80%.  Paternal aunt with tremor.  05/17/15 update:  Last visit, I diagnosed the patient with Parkinson's disease and started her on carbidopa/levodopa 25/100, one tablet 3 times per day.  She states that for the first 6 weeks she had diarrhea but it got better and it went away.  She does think that the tremor is better but does know that stress will trigger the tremor.  She also states that speech therapy seems to set off tremor.  She was started in physical therapy at [facility].  She has noted that she is waking up from dreams yelling and acting out dreams.  She does have a long history of migraine headaches and last visit had wanted me to prescribe Imitrex.  I was very nervous that her age and did not feel that she was likely a triptan candidate.  I ended up ordering an MRI of the brain on 02/28/2015.  I reviewed this and there was evidence of moderate small vessel disease.  I subsequently told her that she was not a candidate for the triptans.  She also had an MRI of the cervical spine on a performed, 2016.  There was mild multilevel cervical spondylosis.  Unfortunately, she has had several headaches since last visit that were fairly severe.  She did see Dr. [name] since last visit in regards to dysphagia and she states that it was scheduled but she ended up having to reschedule it.  08/23/15 update:  The patient is following up today.  She remains on carbidopa/levodopa 25/100, one tablet 3 times per day (8:30am/2pm/10PM).  She does have some  diarrhea that she associates with taking the pill but feels it is better than when she first started taking it.  She also states that it takes her until mid afternoon to feel normal and just feels slow in the morning.  She was started on clonazepam last visit for REM behavior disorder and that has helped dramatically.  She has had some spells of weakness in which her legs give out.  It may happen a few times a week.  She does not relate that to the addition of clonazepam at all.  No falls.  No hallucinations.  She was exercising until her back started hurting again.   In regards to her migraines, I gave her a prescription for Cambia last visit.  She is not a candidate for the triptans any longer.  She picked it up but hasn't had to use it yet.  She is on neurontin 600 mg bid for post surgical pain (states that she had mastectomy and open lung biopsy and then had terrible paresthesias).  Is still like that if doesn't take the neurontin, especially if she has a bra on.  She does complain that she thinks she is not thinking as clear as she used to.  11/28/14 update:  The patient is following up today.  Last visit, I increased her carbidopa/levodopa so that she is taking carbidopa/levodopa 25/100, 2  tablets in the morning, one in the afternoon and one in the evening.  I was hoping that this would help her morning "on."  Today, the patient states that she just has no energy and she is unsure if it is related to the medication; it started before the medication increase.  She thinks that the energy does get a bit better throughout the day.  She is on the klonopin and "I don't want to give that up as I don't dream anymore."  She has so little energy that she has not exercised much.  Does want to go back to PT at [facility].  She is not snoring at night that she knows of.  She's had no falls since last visit.  No hallucinations.  No lightheadedness or near syncope.  She has a has a history of rare migraine.  She does have  Cambia at home but has not had to use that.  She had a "bad" headache last night but got rid of it with tylenol, aleve, and coke.  Also c/o issues with BM's - both constipation and diarrhea.  03/01/16 update: The patient is following up today regarding her Parkinson's disease.  I have had the opportunity to review her prior records made available to me.  I last visit, she was complaining about loss of energy.  It was not sure that this was really related to her medications, but we decided to strategically hold medications.  She held her levodopa for about 4 days, because she thought that that was the culprit.  She felt no different in terms of energy off of the medication and ended up going back on the carbidopa/levodopa 25/100, but for some reason she went from 2 in the morning, one in the afternoon and one in the evening to 1 tablet three times a day (states that she didn't think that the higher dose made a difference).  We subsequently held her clonazepam and her energy may have been a little bit better, but she felt that it was certainly not worth being off of the medication, because she has terrible dreams and acting out the dreams off of the medications.  We decided subsequently to that selegiline for its amphetamine-like properties, but she decided not to take the medication after it was prescribed.  She reported that she wanted to try to increase her energy through exercise.  She is doing the BIG/LOUD exercises at home and walks some.  Brings in a [facility] form for exercise today.  No hallucination but rare visual distortion.  She has had some headaches but hasn't tried the cambia.    06/01/16 update:  The patient is following up today.  Last visit, I encouraged the patient to increase her levodopa back to 2 tablets in the morning, one in the afternoon and one evening, because every time she decreased it she became more rigid.  She states that she did that and she is doing "okay."  She also has a history  of REM behavior disorder and is on half a tablet of clonazepam at nighttime.  She denies falls since last visit.  No hallucinations.  May have occasional lightheadedness but no near syncope.  Thinks that she has "muscle weakness" but points to the neck and states that it is painful and then will feel like the head will tumble off.  Asks me about "posture PT" at [facility].    10/02/16 update:  Pt f/u today.  Pt is on carbidopa/levodopa 25/100, 2 in  the AM, 1 in the afternoon and 1 in the evening.  Still on klonopin 0.5 mg, 1/2 tablet q hs.  No falls since our last visit.  No hallucinations.  No increased lightheadedness.  No syncope.  Mood has been fair.  Doing at least 1.25 miles per day on her [place] for exercise.  02/05/17 update:  Patient follows up today.  She is on carbidopa/levodopa 25/100, 2 tablets in the morning, one in the afternoon and one in the evening.  Reports that she has been doing well since our last visit.  Minimal tremor.  No falls.  No lightheadedness or near syncope.  She takes clonazepam, 0.5 mg, half tablet at night and does well with this.  She sleeps well.  No acting out of the dreams.  When asked about exercise, she states that "I was doing well until winter hit."  In regards to headache, she was having more than usual and was taking 4 tylenol per day but she stopped doing that.    06/12/17 update:  Patient seen in follow-up regarding her Parkinson's disease.  She remains on carbidopa/levodopa 25/100, 2 tablets in the morning, one in the afternoon and one in the evening.  She is also on clonazepam, 0.5 mg, half tablet at night.  She has had a few occasions of yelling out in the night.  Pt denies falls.  Pt has occasional lightheadedness and she will need to sit.  Its worse after she sits.    No hallucinations.  Mood has been good.  States that she is most concerned about cognition.  She will forget what she is saying in the middle of a sentence.  She is having some trouble with  forgetting names.  She puts her pills on the counter so she can see them and therefore she remembers them.  She remembers to pay bills.  She has no trouble driving.    10/25/17 update: Patient is seen in follow-up today regarding her Parkinson's disease.  She is on carbidopa/levodopa 25/100, 2/1/1.  Pt denies falls but she states that she has been very careful.  She will occasionally feel like her legs give way.  She is not exercising because she feels weak from the time she gets up until about 1-2pm.    Pt denies lightheadedness, near syncope.  No hallucinations.  Mood has been good.  She remains on clonazepam 0.5 mg, half a tablet at night for REM behavior disorder.  She has not had any acting out of the dreams or falling out of bed.  She has had one episode of screaming in the middle of the night.  She is having a problem with sleep.  they moved her at [facility] from her apartment to a new apartment.  She is close to industrial dryers that go all night and interrupt her sleep. The records that were made available to me were reviewed.  She saw Dr. Si Raider and had neurocognitive testing on August 15, 2017.  She has reviewed the results with Dr. Si Raider.  There is no evidence of cognitive impairment.  There is no evidence of a psychiatric disorder.    02/27/18 update: Patient is seen today in follow-up for Parkinson's disease.  She is on carbidopa/levodopa 25/100, 2 tablets in the morning, 1 in the afternoon and 1 in the evening.  She is still very tired in the AM.  She has trouble deciphering if weakness or fatigue.  Its a chore to take a shower.  No falls since  last visit.  No lightheadedness or near syncope.  She is on clonazepam 0.5 mg, half tablet at night for REM behavior disorder.  This is working well.  No acting out of dreams.  She does have pleasant dreams Records have been reviewed since our last visit.  She has been to outpatient therapy.   PREVIOUS MEDICATIONS: none to date  ALLERGIES:     Allergies  Allergen Reactions  . Sulfonamide Derivatives Anaphylaxis  . Topamax [Topiramate] Other (See Comments)    Metabolic acidosis   . Biaxin [Clarithromycin] Other (See Comments)    pericarditis  . Hydrocodone Other (See Comments)    "hyper and climbing the walls"  . Motrin [Ibuprofen] Other (See Comments)    headaches  . Symbicort [Budesonide-Formoterol Fumarate]     02/07/15 tremor  . Percocet [Oxycodone-Acetaminophen] Itching and Rash  . Tape Itching and Rash    Use paper tape only  . Tetracyclines & Related Other (See Comments)    "immediate yeast infection"    CURRENT MEDICATIONS:  Outpatient Encounter Medications as of 02/27/2018  Medication Sig  . acetaminophen (TYLENOL) 500 MG tablet Take 2 tablets (1,000 mg total) by mouth 2 (two) times daily as needed.  . Albuterol Sulfate ([name]) 841 (90 Base) MCG/ACT AEPB Inhale 2 puffs into the lungs every 6 (six) hours as needed (dyspnea, wheezing).  [name] aspirin 81 MG tablet Take 81 mg by mouth daily.    . carbidopa-levodopa ([name]) 25-100 MG tablet TAKE 2 TABLETS IN THE MORNING, 1 TABLET IN THE AFTERNOON, 1 IN THE EVENING  . clonazePAM (KLONOPIN) 0.5 MG tablet TAKE 0.5-1 TABLET BY MOUTH AT BEDTIME  . fluticasone ([name]) 110 MCG/ACT inhaler Inhale 2 puffs into the lungs 2 (two) times daily.  [name] gabapentin ([name]) 300 MG capsule Take 2 capsules (600 mg total) by mouth 3 (three) times daily.  [name] losartan (COZAAR) 50 MG tablet Take 1 tablet (50 mg total) by mouth daily.  . metoprolol tartrate (LOPRESSOR) 25 MG tablet Take 1 tablet (25 mg total) by mouth 2 (two) times daily.  . vitamin B-12 (CYANOCOBALAMIN) 1000 MCG tablet Take 1 tablet (1,000 mcg total) by mouth daily.  . Vitamin D, Ergocalciferol, ([name]) 50000 units CAPS capsule TAKE 1 CAPSULE BY MOUTH EVERY WEEK   No facility-administered encounter medications on file as of 02/27/2018.     PAST MEDICAL HISTORY:   Past Medical History:  Diagnosis Date   . 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 Aortic root aneurysm ([place])   . Breast cancer ([place]) 2002   infiltrative ductal carcinoma   . Cataract 2019  . COPD (chronic obstructive pulmonary disease) ([place]) 9/08  . Ductal carcinoma of breast, estrogen receptor positive, stage 1 ([place]) 09/16/2012  . Endometriosis   . Hypercalcemia 09/14/2013  . Hypertension   . Lesion of breast 1992   right, benign  . Lung disease    secondary to [name] infection  . Mastalgia 7/95  . Osteoporosis, post-menopausal 03/14/2012  . Ovarian cancer ([place]) 2004  . Pancreatitis    secondary to Cholelithiasis  . Parkinson disease ([place]) 02/10/15  . Post-thoracotomy pain syndrome   . Pseudomonas pneumonia ([place]) 5/09  . Uterine fibroid   . Vitamin D deficiency     PAST SURGICAL HISTORY:   Past Surgical History:  Procedure Laterality Date  . 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  APPENDECTOMY  2004  . BREAST BIOPSY  08/2004   right breast-benign  . BREAST LUMPECTOMY  1992   benign  . CARDIAC CATHETERIZATION  2007   essentially negation for significant CAD  . CHOLECYSTECTOMY  2001  . COLONOSCOPY  2014   Dr [name] , due 2019  . LUNG BIOPSY  2002   [name], Dr [name]  . MASTECTOMY MODIFIED RADICAL Left 2002   oral chemotheraphy (tamoxifen then Armidex) no radiation, Dr.[name]  . TONSILLECTOMY  1946    SOCIAL HISTORY:   Social History   Socioeconomic History  . Marital status: Widowed    Spouse name: Not on file  . Number of children: 0  . Years of education: Not on file  . Highest education level: Not on file  Occupational History  . 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 Financial resource strain: Not on file  . Food insecurity:    Worry: Not on file    Inability: Not on file  . Transportation needs:    Medical: Not on file    Non-medical: Not on file  Tobacco Use   . Smoking status: Never Smoker  . Smokeless tobacco: Never Used  Substance and Sexual Activity  . Alcohol use: No    Alcohol/week: 0.0 oz  . Drug use: No  . Sexual activity: Never    Birth control/protection: Surgical    Comment: TAH/BSO  Lifestyle  . Physical activity:    Days per week: Not on file    Minutes per session: Not on file  . Stress: Not on file  Relationships  . Social connections:    Talks on phone: Not on file    Gets together: Not on file    Attends religious service: Not on file    Active member of club or organization: Not on file    Attends meetings of clubs or organizations: Not on file    Relationship status: Not on file  . Intimate partner violence:    Fear of current or ex partner: Not on file    Emotionally abused: Not on file    Physically abused: Not on file    Forced sexual activity: Not on file  Other Topics Concern  . 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 Father  Deceased       lung cancer  . Sister  Alive       colon/breast cancer  . Mother  Deceased       emphysema  . Brother  Alive       healthy  . Ethlyn Daniels  (Not Specified)  . Ethlyn Daniels  (Not Specified)  . [place] Deceased  . [place] Deceased  . MGM  Deceased  . MGF  Deceased  . PGM  Deceased  . PGF  Deceased  . [place] Deceased  . [place] Deceased  . [place] Deceased  . Ethlyn Daniels  (Not Specified)  . [place] Deceased  . Cousin  Deceased  . Cousin Jinny Sanders and Geraldine Deceased  . Neg Hx  (  Not Specified)    ROS:  A complete 10 system review of systems was obtained and was unremarkable apart from what is mentioned  above.  PHYSICAL EXAMINATION:    VITALS:   Vitals:   02/27/18 1431  BP: 132/84  Pulse: 66  SpO2: 94%  Weight: 150 lb (68 kg)  Height: 5' 2.5" (1.588 m)    GEN:  The patient appears stated age and is in NAD. HEENT:  Normocephalic, atraumatic.  The mucous membranes are moist. The superficial temporal arteries are without ropiness or tenderness. CV:  RRR Lungs:  CTAB Neck/HEME:  There are no carotid bruits bilaterally.  Neurological examination:  Orientation:  Montreal Cognitive Assessment  10/02/2016  Visuospatial/ Executive (0/5) 4  Naming (0/3) 3  Attention: Read list of digits (0/2) 2  Attention: Read list of letters (0/1) 1  Attention: Serial 7 subtraction starting at 100 (0/3) 3  Language: Repeat phrase (0/2) 2  Language : Fluency (0/1) 1  Abstraction (0/2) 2  Delayed Recall (0/5) 3  Orientation (0/6) 6  Total 27  Adjusted Score (based on education) 27   Cranial nerves: There is good facial symmetry with the exception of the fact that when asked to raise the eyebrows, the right eyebrow raises further than the left.  There is mild facial hypomimia. The speech is fluent and clear.  She is slightly hypophonic.  Soft palate rises symmetrically and there is no tongue deviation. Hearing is intact to conversational tone. Sensation: Sensation is intact to light touch throughout. Motor: Strength is at least antigravity x4.   Movement examination: Tone: There is no tremor Abnormal movements: There is no tremor today Coordination:  There is mild trouble with hand opening and closing on the L.  All other RAMs are normal bilaterally.  Gait and Station: The patient has no difficulty arising out of a deep-seated chair without the use of the hands. Good stride length and good arm swing.    LABS  Lab Results  Component Value Date   WBC 6.7 03/28/2017   HGB 13.7 03/28/2017   HCT 41.4 03/28/2017   MCV 95.1 03/28/2017   PLT 210 03/28/2017     Chemistry      Component Value  Date/Time   NA 136 01/14/2018 1121   NA 141 03/28/2017 1130   K 4.5 01/14/2018 1121   K 4.2 03/28/2017 1130   CL 103 01/14/2018 1121   CL 102 03/10/2013 1529   CO2 29 01/14/2018 1121   CO2 28 03/28/2017 1130   BUN 15 01/14/2018 1121   BUN 17.3 03/28/2017 1130   CREATININE 0.77 01/14/2018 1121   CREATININE 0.8 03/28/2017 1130      Component Value Date/Time   CALCIUM 9.3 01/14/2018 1121   CALCIUM 9.6 03/28/2017 1130   ALKPHOS 85 03/28/2017 1130   AST 12 03/28/2017 1130   ALT <6 03/28/2017 1130   BILITOT 1.38 (H) 03/28/2017 1130       ASSESSMENT/PLAN:  1.   idiopathic Parkinson's disease.  The patient has tremor, bradykinesia, rigidity and mild postural instability.  Sx's have been present for at least since March, 2015 by the patients account .    -She will continue on carbidopa/levodopa 25/100, 2 tablets in the morning, 1 in the afternoon and one evening.  Risks, benefits, side effects and alternative therapies were discussed.  The opportunity to ask questions was given and they were answered to the best of my ability.  The patient expressed understanding and willingness to follow the  outlined treatment protocols. 2. Fatigue  -offered selegeline but she wants to hold on that.  Will let me know if changes her mind (she was worried about SE) 3.  Long hx migraine  -No longer a candidate for triptan therapy.  This is due to cerebral small vessel disease.  She does have Cambia for when necessary use.  Headaches have been better lately 5.  Mild orthostasis  -better now that not on 2 different BP meds.  Will continue to monitor    6.  Neck pain/hyperreflexia  -MRI cervical spine in April, 2016 just demonstrated mild multilevel cervical spondylosis. 7.  REM behavior disorder  -Doing well with low-dose clonazepam, 0.5 mg, half tablet at night.  Risks, benefits, side effects and alternative therapies were discussed.  The opportunity to ask questions was given and they were answered to the  best of my ability.  The patient expressed understanding and willingness to follow the outlined treatment protocols. 8.  Mild cognitive impairment  -She saw Dr. Si Raider and had neurocognitive testing on August 15, 2017.  She has reviewed the results with Dr. Si Raider.  There is no evidence of cognitive impairment, including dementia.  There is no evidence of a psychiatric disorder. 9.  Follow up is anticipated in the next few months, sooner should new neurologic issues arise.  Much greater than 50% of this visit was spent in counseling and coordinating care.  Total face to face time:  25 min

## 2018-02-27 ENCOUNTER — Ambulatory Visit: Payer: Medicare Other | Admitting: Neurology

## 2018-02-27 ENCOUNTER — Other Ambulatory Visit: Payer: Self-pay

## 2018-02-27 ENCOUNTER — Encounter: Payer: Self-pay | Admitting: Neurology

## 2018-02-27 VITALS — BP 132/84 | HR 66 | Ht 62.5 in | Wt 150.0 lb

## 2018-02-27 DIAGNOSIS — G20 Parkinson's disease: Secondary | ICD-10-CM | POA: Diagnosis not present

## 2018-02-27 DIAGNOSIS — G4752 REM sleep behavior disorder: Secondary | ICD-10-CM

## 2018-02-27 DIAGNOSIS — G20A1 Parkinson's disease without dyskinesia, without mention of fluctuations: Secondary | ICD-10-CM

## 2018-02-27 NOTE — Patient Instructions (Signed)
  [facility]ing Together for Parkinson's & Movement Disorders  The [facility] team know that living well with a movement disorder extends far beyond our clinic walls. We are together with you. Our team is passionate about providing resources to you and your loved ones who are living with Parkinson's disease and movement disorders. Participate in these programs and join our community. These resources are free or low cost!   [facility] is adding:   Innovative educational programs for patients and caregivers.   Support groups for patients and caregivers living with Parkinson's disease.   Parkinson's specific exercise programs.   Custom tailored therapeutic programs that will benefit patient's living with Parkinson's disease.   We are in this together. You can help and contribute to grow these programs and resources in our community. 100% of the funds donated to the Movement Disorders Fund stays right here in our community to support patients and their caregivers.  To make a tax deductible contribution:  -ask for a [facility] Together for Parkinson's envelope in the office today.  - call the Office of Institutional Advancement at [phone].         

## 2018-03-12 ENCOUNTER — Telehealth: Payer: Self-pay | Admitting: Obstetrics & Gynecology

## 2018-03-12 DIAGNOSIS — K3189 Other diseases of stomach and duodenum: Secondary | ICD-10-CM

## 2018-03-12 NOTE — Telephone Encounter (Signed)
Spoke with patient. Patient states she is waiting to hear about scheduling an endoscopy with Dr.Perry. Reports she has seen Dr.Perry in the past. Is also due for a colonoscopy this year. States Dr.Miller mentioned scheduling endoscopy when CT results were discussed. Advised will speak with Dr.Miller and return call with further information.  Dr.Miller, okay to proceed with scheduling consult appointment with Dr.Perry for patient to discuss endoscopy and colonoscopy?

## 2018-03-12 NOTE — Telephone Encounter (Signed)
Patient is waiting to hear back on getting an endoscopy scheduled.

## 2018-03-13 ENCOUNTER — Encounter: Payer: Self-pay | Admitting: Internal Medicine

## 2018-03-13 NOTE — Telephone Encounter (Signed)
She does need to proceed with appt with Dr. [name] for follow-up.  Can you call and get an appt with his office for her.  She has thickening of the stomach that appeared on CT scan.  Thanks.

## 2018-03-13 NOTE — Telephone Encounter (Signed)
Spoke with Dr.Perry's office. Appointment scheduled for first available follow up on 05/06/2018 at 1:45 pm. Spoke with patient who is agreeable to date and time. Advised will review with Dr.Miller and if she would like her to see another provider for earlier appointment will notify patient.

## 2018-03-13 NOTE — Telephone Encounter (Signed)
I do wish it were earlier as well.  It pt is ok with seeing someone else and [facility] will do that, can you see if she can be seen earlier by another provider?  Thanks.

## 2018-03-18 NOTE — Telephone Encounter (Signed)
Spoke with patient who is agreeable to referral to [facility]. Call to [facility]. Appointment scheduled for first available 04/01/2018 at 10 am with Dr.Karki. Recent aex, CT scan, and labs faxed to [facility] at [phone].   Spoke with patient who is agreeable to appointment date and time. All information to [facility] given to patient.   Cc: Magdalene Patricia  Routing to provider for final review. Patient agreeable to disposition. Will close encounter.

## 2018-04-02 ENCOUNTER — Encounter: Payer: Self-pay | Admitting: Internal Medicine

## 2018-04-10 ENCOUNTER — Emergency Department
Admission: EM | Admit: 2018-04-10 | Discharge: 2018-04-11 | Disposition: A | Discharge status: Home / Self Care | Payer: Medicare Other | Attending: Student in an Organized Health Care Education/Training Program | Admitting: Student in an Organized Health Care Education/Training Program

## 2018-04-10 ENCOUNTER — Emergency Department: Payer: Medicare Other

## 2018-04-10 DIAGNOSIS — Z7982 Long term (current) use of aspirin: Secondary | ICD-10-CM | POA: Insufficient documentation

## 2018-04-10 DIAGNOSIS — J449 Chronic obstructive pulmonary disease, unspecified: Secondary | ICD-10-CM | POA: Insufficient documentation

## 2018-04-10 DIAGNOSIS — Z1273 Encounter for screening for malignant neoplasm of ovary: Secondary | ICD-10-CM | POA: Diagnosis not present

## 2018-04-10 DIAGNOSIS — Y999 Unspecified external cause status: Secondary | ICD-10-CM | POA: Insufficient documentation

## 2018-04-10 DIAGNOSIS — Z853 Personal history of malignant neoplasm of breast: Secondary | ICD-10-CM | POA: Diagnosis not present

## 2018-04-10 DIAGNOSIS — G20 Parkinson's disease: Secondary | ICD-10-CM | POA: Diagnosis not present

## 2018-04-10 DIAGNOSIS — S8992XA Unspecified injury of left lower leg, initial encounter: Secondary | ICD-10-CM | POA: Diagnosis present

## 2018-04-10 DIAGNOSIS — Y939 Activity, unspecified: Secondary | ICD-10-CM | POA: Insufficient documentation

## 2018-04-10 DIAGNOSIS — I10 Essential (primary) hypertension: Secondary | ICD-10-CM | POA: Insufficient documentation

## 2018-04-10 DIAGNOSIS — Z79899 Other long term (current) drug therapy: Secondary | ICD-10-CM | POA: Diagnosis not present

## 2018-04-10 DIAGNOSIS — Y929 Unspecified place or not applicable: Secondary | ICD-10-CM | POA: Insufficient documentation

## 2018-04-10 DIAGNOSIS — S82092A Other fracture of left patella, initial encounter for closed fracture: Secondary | ICD-10-CM | POA: Diagnosis not present

## 2018-04-10 DIAGNOSIS — W1830XA Fall on same level, unspecified, initial encounter: Secondary | ICD-10-CM | POA: Diagnosis not present

## 2018-04-10 DIAGNOSIS — S93401A Sprain of unspecified ligament of right ankle, initial encounter: Secondary | ICD-10-CM

## 2018-04-10 DIAGNOSIS — W19XXXA Unspecified fall, initial encounter: Secondary | ICD-10-CM

## 2018-04-10 MED ORDER — ACETAMINOPHEN 500 MG PO TABS
1000.0000 mg | ORAL_TABLET | Freq: Once | ORAL | Status: AC
  Administered 2018-04-10: 1000 mg via ORAL
  Filled 2018-04-10: qty 2

## 2018-04-10 NOTE — ED Provider Notes (Signed)
[facility] Emergency Department Provider Note  ____________________________________________  Time seen: Approximately 10:08 PM  I have reviewed the triage vital signs and the nursing notes.   HISTORY  Chief Complaint Knee Pain    HPI Crystal Bass is a 77 y.o. female presents to the emergency department after being referred by urgent care.  Patient reports that she was diagnosed with a right ankle sprain and a left patella fracture today.  Patient was referred to the emergency department by [facility] provider.  Patient is not equipped with x-rays from [facility].  She was placed in a knee immobilizer and does not have prescriptions for pain or a referral to orthopedics.  Patient did not hit her head or lose consciousness.  Patient lives independently at [facility].  She does not have family that lives nearby .    Past Medical History:  Diagnosis Date  . 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 Aortic root aneurysm ([place])   . Breast cancer ([place]) 2002   infiltrative ductal carcinoma   . Cataract 2019  . COPD (chronic obstructive pulmonary disease) ([place]) 9/08  . Ductal carcinoma of breast, estrogen receptor positive, stage 1 ([place]) 09/16/2012  . Endometriosis   . Hypercalcemia 09/14/2013  . Hypertension   . Lesion of breast 1992   right, benign  . Lung disease    secondary to [name] infection  . Mastalgia 7/95  . Osteoporosis, post-menopausal 03/14/2012  . Ovarian cancer ([place]) 2004  . Pancreatitis    secondary to Cholelithiasis  . Parkinson disease ([place]) 02/10/15  . Post-thoracotomy pain syndrome   . Pseudomonas pneumonia ([place]) 5/09  . Uterine fibroid   . Vitamin D deficiency     Patient Active Problem List   Diagnosis Date Noted  . Renal cyst, acquired, right 02/02/2018  . Advanced care planning/counseling discussion 01/26/2018  . Liver lesion 01/21/2018  . Low serum vitamin B12 01/21/2018  . Right-sided chest wall pain  01/21/2018  . Chronic fatigue 10/23/2017  . Lesion of left lung 10/23/2017  . Genetic testing 05/01/2017  . Encounter for general adult medical examination with abnormal findings 12/27/2016  . Parkinson disease ([place]) 01/10/2016  . DNR (do not resuscitate) 05/22/2015  . Tremor 02/07/2015  . Dysphagia, pharyngoesophageal phase 02/07/2015  . Hypercalcemia 09/14/2013  . Unspecified vitamin D deficiency 04/27/2013  . Breast cancer of upper-outer quadrant of left female breast ([place]) 09/16/2012  . Osteoporosis, post-menopausal 03/14/2012  . Aortic aneurysm ([place]) 01/01/2011  . OSTEOARTHRITIS, CERVICAL SPINE 08/15/2009  . ALLERGIC RHINITIS 03/25/2008  . Chronic obstructive airway disease with asthma ([place]) 03/25/2008  . BREAST CANCER, HX OF 03/10/2008  . Mixed hyperlipidemia 02/10/2007  . Essential hypertension 02/10/2007    Past Surgical History:  Procedure Laterality Date  . 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 APPENDECTOMY  2004  . BREAST BIOPSY  08/2004   right breast-benign  . BREAST LUMPECTOMY  1992   benign  . CARDIAC CATHETERIZATION  2007   essentially negation for significant CAD  . CHOLECYSTECTOMY  2001  . COLONOSCOPY  2014   Dr [name] , due 2019  . LUNG BIOPSY  2002   [name], Dr [name]  . MASTECTOMY MODIFIED RADICAL Left 2002   oral chemotheraphy (tamoxifen then Armidex) no radiation, Dr.[name]  . TONSILLECTOMY  1946    Prior to Admission medications   Medication Sig  Start Date End Date Taking? Authorizing Provider  acetaminophen (TYLENOL) 500 MG tablet Take 2 tablets (1,000 mg total) by mouth 2 (two) times daily as needed. 01/21/18   [name], MD  Albuterol Sulfate ([name]) 086 (90 Base) MCG/ACT AEPB Inhale 2 puffs into the lungs every 6 (six) hours as needed (dyspnea, wheezing).    [provider]  aspirin 81 MG tablet Take 81 mg by mouth daily.      [provider]   carbidopa-levodopa ([name]) 25-100 MG tablet TAKE 2 TABLETS IN THE MORNING, 1 TABLET IN THE AFTERNOON, 1 IN THE EVENING 11/07/17   Tat, Eustace Quail, DO  clonazePAM (KLONOPIN) 0.5 MG tablet TAKE 0.5-1 TABLET BY MOUTH AT BEDTIME 06/12/17   Tat, Rebecca S, DO  fluticasone ([name]) 110 MCG/ACT inhaler Inhale 2 puffs into the lungs 2 (two) times daily.    [provider]  gabapentin ([name]) 300 MG capsule Take 2 capsules (600 mg total) by mouth 3 (three) times daily. 01/21/18   [name], MD  losartan (COZAAR) 50 MG tablet Take 1 tablet (50 mg total) by mouth daily. 01/21/18   [name], MD  metoprolol tartrate (LOPRESSOR) 25 MG tablet Take 1 tablet (25 mg total) by mouth 2 (two) times daily. 01/21/18   [name], MD  vitamin B-12 (CYANOCOBALAMIN) 1000 MCG tablet Take 1 tablet (1,000 mcg total) by mouth daily. 10/27/17   [name], MD  Vitamin D, Ergocalciferol, ([name]) 50000 units CAPS capsule TAKE 1 CAPSULE BY MOUTH EVERY WEEK 01/03/18   Megan Salon, MD    Allergies Sulfonamide derivatives; Topamax [topiramate]; Biaxin [clarithromycin]; Hydrocodone; Motrin [ibuprofen]; Symbicort [budesonide-formoterol fumarate]; Percocet [oxycodone-acetaminophen]; Tape; and Tetracyclines & related  Family History  Problem Relation Age of Onset  . Lung cancer Father 59       d.82 history of smoking  . Breast cancer Sister 14  . Colon cancer Sister   . Emphysema Mother        d.64 was never a smoker  . Stroke Paternal Aunt        in mid 43s  . Osteoporosis Paternal Aunt   . Melanoma Maternal Aunt   . Cancer Maternal Uncle        unspecified type  . Cancer Maternal Grandmother        d.82s unspecified GI cancer  . Cancer Maternal Grandfather        d.62s unspecified type  . Liver cancer Maternal Aunt   . Cancer Maternal Uncle        unspecified type  . Cancer Maternal Uncle        unspecified type  . Myasthenia gravis Paternal Aunt   . Breast cancer  Cousin        d.60s-daughter of unaffected paternal aunt Eustace Moore  . Colon cancer Cousin 62       d.70-daughter of unaffected maternal aunt Leila  . Lung cancer Cousin 70       d.70-sisters to each other, both daughters of maternal uncle Johnny  . Diabetes Neg Hx   . Heart disease Neg Hx     Social History Social History   Tobacco Use  . Smoking status: Never Smoker  . Smokeless tobacco: Never Used  Substance Use Topics  . Alcohol use: No    Alcohol/week: 0.0 oz  . Drug use: No     Review of Systems  Constitutional: No fever/chills Eyes: No visual changes. No discharge ENT: No upper respiratory complaints. Cardiovascular: no chest  pain. Respiratory: no cough. No SOB. Musculoskeletal: Patient has left knee pain and right ankle pain.  Skin: Negative for rash, abrasions, lacerations, ecchymosis. Neurological: Negative for headaches, focal weakness or numbness.   ____________________________________________   PHYSICAL EXAM:  VITAL SIGNS: ED Triage Vitals [04/10/18 2147]  Enc Vitals Group     BP (!) 171/83     Pulse Rate 67     Resp 18     Temp 98.3 F (36.8 C)     Temp Source Oral     SpO2 94 %     Weight 150 lb (68 kg)     Height      Head Circumference      Peak Flow      Pain Score 5     Pain Loc      Pain Edu?      Excl. in [place]?      Constitutional: Alert and oriented. Well appearing and in no acute distress. Eyes: Conjunctivae are normal. PERRL. EOMI. Head: Atraumatic. ENT:      Ears: TMs are pearly.      Nose: No congestion/rhinnorhea.      Mouth/Throat: Mucous membranes are moist.  Neck: No stridor.  No cervical spine tenderness to palpation. Cardiovascular: Normal rate, regular rhythm. Normal S1 and S2.  Good peripheral circulation. Respiratory: Normal respiratory effort without tachypnea or retractions. Lungs CTAB. Good air entry to the bases with no decreased or absent breath sounds. Gastrointestinal: Bowel sounds 4 quadrants. Soft and nontender  to palpation. No guarding or rigidity. No palpable masses. No distention. No CVA tenderness. Musculoskeletal: Patient is unable to perform full range of motion of the right ankle and left knee.  She has tenderness to palpation over left patella.  She has tenderness to palpation over the anterior talofibular ligament, right.  Palpable dorsalis pedis pulse bilaterally and symmetrically. Neurologic:  Normal speech and language. No gross focal neurologic deficits are appreciated.  Skin:  Skin is warm, dry and intact. No rash noted. Psychiatric: Mood and affect are normal. Speech and behavior are normal. Patient exhibits appropriate insight and judgement.   ____________________________________________   LABS (all labs ordered are listed, but only abnormal results are displayed)  Labs Reviewed - No data to display ____________________________________________  EKG   ____________________________________________  RADIOLOGY Unk Pinto, personally viewed and evaluated these images (plain radiographs) as part of my medical decision making, as well as reviewing the written report by the radiologist.   Dg Ankle Complete Right  Result Date: 04/10/2018 CLINICAL DATA:  Mechanical fall with sprained right ankle EXAM: RIGHT ANKLE - COMPLETE 3+ VIEW COMPARISON:  None. FINDINGS: Lateral soft tissue swelling. Possible dorsal cortical avulsion injury off the anterior talus and posterior navicular. Overlying soft tissue swelling. Small plantar calcaneal spur. Ankle mortise is symmetric IMPRESSION: Soft tissue swelling. Possible dorsal cortical avulsion fracture injuries off the anterior talus and dorsal navicular. Electronically Signed   By: Donavan Foil M.D.   On: 04/10/2018 22:30   Dg Knee Complete 4 Views Left  Result Date: 04/10/2018 CLINICAL DATA:  Knee pain after mechanical fall EXAM: LEFT KNEE - COMPLETE 4+ VIEW COMPARISON:  None. FINDINGS: Acute minimally displaced fracture involving the mid to  inferior aspect of the patella with 6 mm fracture cleft. No significant knee effusion. Prepatellar soft tissue swelling. Mild degenerative changes of the medial compartment. IMPRESSION: Best seen on oblique views is a minimally separated fracture involving the mid to inferior aspect of the patella Electronically Signed  By: Donavan Foil M.D.   On: 04/10/2018 22:28    ____________________________________________    PROCEDURES  Procedure(s) performed:    Procedures    Medications  acetaminophen (TYLENOL) tablet 1,000 mg (1,000 mg Oral Given 04/10/18 2237)     ____________________________________________   INITIAL IMPRESSION / ASSESSMENT AND PLAN / ED COURSE  Pertinent labs & imaging results that were available during my care of the patient were reviewed by me and considered in my medical decision making (see chart for details).  Review of the [place] CSRS was performed in accordance of the [place] prior to dispensing any controlled drugs.     Assessment and Plan: Fall Patient presents to the emergency department with left knee pain and right ankle pain after a fall that occurred today.  X-ray examination is concerning for a mildly displaced left patella fracture and avulsion type fractures of the right talus and navicular.  I am concerned that patient will not be able to navigate around her apartment due to a need for a knee immobilizer.  Patient case was discussed with attending, Dr. Darel Hong and we agreed that patient would stay in the emergency department  for social work consult in the morning in order to coordinate possible skilled nursing care with [facility]. Patient does not have family that lives in the area and lives by herself.  She is cooperative with plan.  All patient questions were answered.   ____________________________________________  FINAL CLINICAL IMPRESSION(S) / ED DIAGNOSES  Final diagnoses:  Fall      NEW MEDICATIONS STARTED DURING THIS  VISIT:  ED Discharge Orders    None          This chart was dictated using voice recognition software/Dragon. Despite best efforts to proofread, errors can occur which can change the meaning. Any change was purely unintentional.    Lannie Fields, PA-C 04/10/18 2327    Merlyn Lot, MD 04/10/18 [phone]

## 2018-04-10 NOTE — ED Notes (Signed)
Pt splinted with knee brace at [facility].  Pt sent here by UC to be further evaluated for patella fx.  Pt is A&Ox4, in NAD.

## 2018-04-10 NOTE — ED Triage Notes (Signed)
Patient reports mechanical fall earlier today. Patient dx with sprained right ankle, and broken left patella at urgent care today.

## 2018-04-11 MED ORDER — ACETAMINOPHEN 500 MG PO TABS
1000.0000 mg | ORAL_TABLET | Freq: Once | ORAL | Status: AC
  Administered 2018-04-11: 1000 mg via ORAL
  Filled 2018-04-11: qty 2

## 2018-04-11 MED ORDER — CARBIDOPA-LEVODOPA 25-100 MG PO TABS
1.0000 | ORAL_TABLET | Freq: Once | ORAL | Status: AC
  Administered 2018-04-11: 1 via ORAL
  Filled 2018-04-11: qty 1

## 2018-04-11 MED ORDER — CARBIDOPA-LEVODOPA 25-100 MG PO TABS
2.0000 | ORAL_TABLET | Freq: Every day | ORAL | Status: DC
  Administered 2018-04-11: 2 via ORAL
  Filled 2018-04-11 (×2): qty 2

## 2018-04-11 MED ORDER — CLONAZEPAM 0.5 MG PO TABS
0.5000 mg | ORAL_TABLET | Freq: Once | ORAL | Status: DC
  Filled 2018-04-11: qty 1

## 2018-04-11 MED ORDER — METOPROLOL TARTRATE 25 MG PO TABS
25.0000 mg | ORAL_TABLET | Freq: Two times a day (BID) | ORAL | Status: DC
  Administered 2018-04-11: 25 mg via ORAL
  Filled 2018-04-11: qty 1

## 2018-04-11 MED ORDER — GABAPENTIN 300 MG PO CAPS
600.0000 mg | ORAL_CAPSULE | Freq: Three times a day (TID) | ORAL | Status: DC
  Administered 2018-04-11 (×2): 600 mg via ORAL
  Filled 2018-04-11 (×5): qty 2

## 2018-04-11 MED ORDER — LOSARTAN POTASSIUM 50 MG PO TABS
50.0000 mg | ORAL_TABLET | Freq: Every day | ORAL | Status: DC
  Administered 2018-04-11: 50 mg via ORAL
  Filled 2018-04-11: qty 1

## 2018-04-11 NOTE — Clinical Social Work Note (Signed)
Clinical Social Work Assessment  Patient Details  Name: Crystal Bass MRN: [id number] Date of Birth: [birth year]  Date of referral:  04/11/18               Reason for consult:  Facility Placement                Permission sought to share information with:  Chartered certified accountant granted to share information::  Yes, Verbal Permission Granted  Name::        Agency::  [facility]  Relationship::     Contact Information:     Housing/Transportation Living arrangements for the past 2 months:  [place] of Information:  Patient Patient Interpreter Needed:  None Criminal Activity/Legal Involvement Pertinent to Current Situation/Hospitalization:  No - Comment as needed Significant Relationships:  Church Lives with:  Self Do you feel safe going back to the place where you live?  Yes Need for family participation in patient care:  Yes (Comment)  Care giving concerns: No concerns   Facilities manager / plan:  LCSW introduced myself to patient and obtained verbal consent to speak to Seth Bake at [employer]. Patient reports she was fully independent until the fall. She requires full assistance with her ADL. LCSW awaiting PT consult. Patient is from [facility] and will stay at their SNF. Patient reports she is widowed and has no children. Patient is active 42 year old and is oriented x4. She is active with her church. Patient is not able to walk at this time.UHC/ Medicare Patient will complete Fl2 ,passr and and call report to [facility]. 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) PT Recommendations:  [place] / Referral to community resources:  [place]  Patient/Family's Response to care: Good understanding and stated she is to go to [facility]  Patient/Family's Understanding of and Emotional Response to Diagnosis, Current Treatment,  and Prognosis: Good understanding  Emotional Assessment Appearance:  Appears stated age Attitude/Demeanor/Rapport:  Gracious, Charismatic Affect (typically observed):  Accepting Orientation:  Oriented to Place, Oriented to Self, Oriented to Situation, Oriented to  Time Alcohol / Substance use:  Not Applicable Psych involvement (Current and /or in the community):  No (Comment)  Discharge Needs  Concerns to be addressed:  No discharge needs identified Readmission within the last 30 days:  No Current discharge risk:  None Barriers to Discharge:  No Barriers Identified   [name], LCSW 04/11/2018, 10:22 AM

## 2018-04-11 NOTE — Progress Notes (Signed)
LCSW re-faxed Fl2 to [facility]. [place] LCSW [phone]

## 2018-04-11 NOTE — ED Notes (Signed)
EMS  CALLED  FOR  TRANSPORT

## 2018-04-11 NOTE — ED Notes (Signed)
Pt resting in bed, trash removed from bedside at this time. Pt noted to be speaking on telephone at this time. Awaiting Social work and PT consults. Will continue to monitor for further patient needs.

## 2018-04-11 NOTE — Progress Notes (Signed)
ED RN and ED Secretary consulted and EDP patient is ready to DC to [facility]  No further needs

## 2018-04-11 NOTE — ED Notes (Signed)
Pt repositioned in bed and given meal tray. Pt denies any needs at this time, states was assisted to bathroom by PT. Pt remains alert and oriented, will continue to monitor for further patient needs.

## 2018-04-11 NOTE — ED Notes (Signed)
Pt repositioned in bed to eat breakfast. PT visualized in NAD. Pt currently eating breakfast. Awaiting SW to complete consult and PT for evaluation. VSS and WNL.

## 2018-04-11 NOTE — Progress Notes (Signed)
Patient can be discharged to [facility] and transported by EMS  Call report number is  [phone] Patient is room number 230.  LCSW uploaded to HUB the therapy notes and spoke to EDP to order in home face to face PT and OT orders.  No further needs patient can be transported to facility now.  [employer] LCSW [phone]

## 2018-04-11 NOTE — ED Notes (Signed)
Pt assisted to the bathroom, instructed to use call bell when finished. Pt states understanding. Will continue to monitor for further patient needs.

## 2018-04-11 NOTE — NC FL2 (Signed)
[name] LEVEL OF CARE SCREENING TOOL     IDENTIFICATION  Patient Name: Crystal Bass Birthdate: 14-May-1941 Sex: female Admission Date (Current Location): 04/10/2018  [facility] and [place] Number:  Engineering geologist and Address:  [facility], [address], [place], [place] [address]      Provider Number: [id number]  Attending Physician Name and Address:  No att. providers found  Relative Name and Phone Number:       Current Level of Care: Hospital Recommended Level of Care: [name] Prior Approval Number:    Date Approved/Denied:   PASRR Number:   [id number]  Discharge Plan: SNF    Current Diagnoses: Patient Active Problem List   Diagnosis Date Noted  . Renal cyst, acquired, right 02/02/2018  . Advanced care planning/counseling discussion 01/26/2018  . Liver lesion 01/21/2018  . Low serum vitamin B12 01/21/2018  . Right-sided chest wall pain 01/21/2018  . Chronic fatigue 10/23/2017  . Lesion of left lung 10/23/2017  . Genetic testing 05/01/2017  . Encounter for general adult medical examination with abnormal findings 12/27/2016  . Parkinson disease ([place]) 01/10/2016  . DNR (do not resuscitate) 05/22/2015  . Tremor 02/07/2015  . Dysphagia, pharyngoesophageal phase 02/07/2015  . Hypercalcemia 09/14/2013  . Unspecified vitamin D deficiency 04/27/2013  . Breast cancer of upper-outer quadrant of left female breast ([place]) 09/16/2012  . Osteoporosis, post-menopausal 03/14/2012  . Aortic aneurysm ([place]) 01/01/2011  . OSTEOARTHRITIS, CERVICAL SPINE 08/15/2009  . ALLERGIC RHINITIS 03/25/2008  . Chronic obstructive airway disease with asthma ([place]) 03/25/2008  . BREAST CANCER, HX OF 03/10/2008  . Mixed hyperlipidemia 02/10/2007  . Essential hypertension 02/10/2007    Orientation RESPIRATION BLADDER Height & Weight     Self, Time, Situation, Place  Normal Incontinent Weight: 150 lb (68 kg) Height:      BEHAVIORAL SYMPTOMS/MOOD NEUROLOGICAL BOWEL NUTRITION STATUS      Incontinent Diet(NORMAL)  AMBULATORY STATUS COMMUNICATION OF NEEDS Skin   Extensive Assist Verbally Normal                       Personal Care Assistance Level of Assistance  Bathing, Feeding, Dressing, Total care Bathing Assistance: Limited assistance Feeding assistance: Independent Dressing Assistance: Limited assistance Total Care Assistance: Limited assistance   Functional Limitations Info  Sight, Hearing, Speech Sight Info: Adequate Hearing Info: Adequate Speech Info: Adequate    SPECIAL CARE FACTORS FREQUENCY  PT (By licensed PT), OT (By licensed OT)     PT Frequency: X5 OT Frequency: X5            Contractures Contractures Info: Not present    Additional Factors Info  Allergies, Code Status Code Status Info: full code Allergies Info: Sulfonamide Derivatives, Topamax Topiramate, Biaxin Clarithromycin, Hydrocodone, Motrin Ibuprofen, Symbicort Budesonide-formoterol Fumarate, Percocet Oxycodone-acetaminophen, Tape, Tetracyclines & Related           Current Medications (04/11/2018):  This is the current hospital active medication list Current Facility-Administered Medications  Medication Dose Route Frequency Provider Last Rate Last Dose  . carbidopa-levodopa ([name]) 25-100 MG per tablet immediate release 2 tablet  2 tablet Oral Y1950 Hinda Kehr, MD   2 tablet at 04/11/18 0647  . gabapentin ([name]) capsule 600 mg  600 mg Oral TID Hinda Kehr, MD      . metoprolol tartrate (LOPRESSOR) tablet 25 mg  25 mg Oral BID Hinda Kehr, MD       Current Outpatient Medications  Medication Sig Dispense  Refill  . acetaminophen (TYLENOL) 500 MG tablet Take 2 tablets (1,000 mg total) by mouth 2 (two) times daily as needed.    . Albuterol Sulfate ([name]) 092 (90 Base) MCG/ACT AEPB Inhale 2 puffs into the lungs every 6 (six) hours as needed (dyspnea, wheezing).    [name] aspirin 81 MG  tablet Take 81 mg by mouth daily.      . carbidopa-levodopa ([name]) 25-100 MG tablet TAKE 2 TABLETS IN THE MORNING, 1 TABLET IN THE AFTERNOON, 1 IN THE EVENING 360 tablet 1  . clonazePAM (KLONOPIN) 0.5 MG tablet TAKE 0.5-1 TABLET BY MOUTH AT BEDTIME 90 tablet 1  . fluticasone ([name]) 110 MCG/ACT inhaler Inhale 2 puffs into the lungs 2 (two) times daily.    [name] gabapentin ([name]) 300 MG capsule Take 2 capsules (600 mg total) by mouth 3 (three) times daily. 540 capsule 3  . losartan (COZAAR) 50 MG tablet Take 1 tablet (50 mg total) by mouth daily. 90 tablet 3  . metoprolol tartrate (LOPRESSOR) 25 MG tablet Take 1 tablet (25 mg total) by mouth 2 (two) times daily. 180 tablet 3  . vitamin B-12 (CYANOCOBALAMIN) 1000 MCG tablet Take 1 tablet (1,000 mcg total) by mouth daily.    . Vitamin D, Ergocalciferol, ([name]) 50000 units CAPS capsule TAKE 1 CAPSULE BY MOUTH EVERY WEEK 12 capsule 3     Discharge Medications: Please see discharge summary for a list of discharge medications.  Relevant Imaging Results:  Relevant Lab Results:   Additional Information SSN [id number]  [name], [place]

## 2018-04-11 NOTE — Evaluation (Signed)
Physical Therapy Evaluation Patient Details Name: Crystal Bass MRN: [id number] DOB: 1941/07/18 Today's Date: 04/11/2018   History of Present Illness  Pt admitted for L patella fx and possible dorsal cortical avulsion fx on R ankle from fall at home. Went to urgent care then hospital ED. Pt currently in knee brace on L LE. Spoke with attending MD, reporting with clearance for WBAT to B LEs at this time. Pt reports no previous falls. History includes COPD, breast ca, pancreatitis, and Parkinson's Dx.  Clinical Impression  Pt is a pleasant 77 year old female who was admitted for falls with L patella fx and R ankle fracture. Of note, no boot applied to R foot and L LE in knee brace. Pt motivated and willing to work with therapy. Pt performs bed mobility, transfers, and ambulation with min assist and RW. Very limited by pain. Pt demonstrates deficits with strength/pain/mobility. Pt is currently not at baseline level. Would benefit from skilled PT to address above deficits and promote optimal return to PLOF; recommend transition to STR upon discharge from acute hospitalization.       Follow Up Recommendations SNF    Equipment Recommendations  (pt has RW)    Recommendations for Other Services       Precautions / Restrictions Precautions Precautions: Fall Required Braces or Orthoses: Knee Immobilizer - Left Knee Immobilizer - Left: On at all times Restrictions Weight Bearing Restrictions: Yes RLE Weight Bearing: Weight bearing as tolerated LLE Weight Bearing: Weight bearing as tolerated      Mobility  Bed Mobility Overal bed mobility: Needs Assistance Bed Mobility: Supine to Sit     Supine to sit: Min assist     General bed mobility comments: needs assist and cues for sequencing. Once seated at EOB, able to sit with upright posture. Increased pain noted in transition of body position  Transfers Overall transfer level: Needs assistance Equipment used: Rolling walker (2  wheeled) Transfers: Sit to/from Stand Sit to Stand: Min assist         General transfer comment: educated on proper use of RW prior to mobility attempts. Able to use RW and demonstrate upright posture  Ambulation/Gait Ambulation/Gait assistance: Min assist Ambulation Distance (Feet): 10 Feet Assistive device: Rolling walker (2 wheeled) Gait Pattern/deviations: Step-to pattern     General Gait Details: very short step and antalgic gait pattern with cues to keep safe distance from RW. HEavy B UE WBing noted. Fatigues quickly and limited by pain  Stairs            Wheelchair Mobility    Modified Rankin (Stroke Patients Only)       Balance Overall balance assessment: History of Falls;Needs assistance Sitting-balance support: Feet supported Sitting balance-Leahy Scale: Good     Standing balance support: Bilateral upper extremity supported Standing balance-Leahy Scale: Good                               Pertinent Vitals/Pain Pain Assessment: 0-10 Pain Score: 4  Pain Location: L ant knee Pain Descriptors / Indicators: Aching;Discomfort;Dull Pain Intervention(s): Limited activity within patient's tolerance;Premedicated before session;Repositioned    Home Living Family/patient expects to be discharged to:: Skilled nursing facility                      Prior Function Level of Independence: Independent         Comments: lives at [place], previously independent with  all mobility without AD. However has been unable to walk at this time due to severe pain.     Hand Dominance        Extremity/Trunk Assessment   Upper Extremity Assessment Upper Extremity Assessment: Generalized weakness(B UE grossly 4/5)    Lower Extremity Assessment Lower Extremity Assessment: Generalized weakness(L LE grossly 3/5; R LE grossly 4/5)       Communication   Communication: No difficulties  Cognition Arousal/Alertness: Awake/alert Behavior During  Therapy: WFL for tasks assessed/performed Overall Cognitive Status: Within Functional Limits for tasks assessed                                        General Comments      Exercises Other Exercises Other Exercises: ambulated to bathroom in room with min assist, needs assist for lowering down to toliet and hygiene. Cues for sequencing for transfers.   Assessment/Plan    PT Assessment Patient needs continued PT services  PT Problem List Decreased strength;Decreased activity tolerance;Decreased balance;Decreased mobility;Decreased knowledge of use of DME;Pain       PT Treatment Interventions DME instruction;Gait training;Therapeutic exercise;Balance training    PT Goals (Current goals can be found in the Care Plan section)  Acute Rehab PT Goals Patient Stated Goal: to go to rehab PT Goal Formulation: With patient Time For Goal Achievement: 04/25/18 Potential to Achieve Goals: Good    Frequency 7X/week   Barriers to discharge Decreased caregiver support      Co-evaluation               AM-PAC PT "6 Clicks" Daily Activity  Outcome Measure Difficulty turning over in bed (including adjusting bedclothes, sheets and blankets)?: Unable Difficulty moving from lying on back to sitting on the side of the bed? : Unable Difficulty sitting down on and standing up from a chair with arms (e.g., wheelchair, bedside commode, etc,.)?: Unable Help needed moving to and from a bed to chair (including a wheelchair)?: A Little Help needed walking in hospital room?: A Lot Help needed climbing 3-5 steps with a railing? : Total 6 Click Score: 9    End of Session Equipment Utilized During Treatment: Gait belt Activity Tolerance: Patient limited by pain Patient left: in bed Nurse Communication: Mobility status PT Visit Diagnosis: Muscle weakness (generalized) (M62.81);Difficulty in walking, not elsewhere classified (R26.2);Pain Pain - Right/Left: Left Pain - part of body:  Knee    Time: 1201-1225 PT Time Calculation (min) (ACUTE ONLY): 24 min   Charges:   PT Evaluation $PT Eval Low Complexity: 1 Low PT Treatments $Therapeutic Activity: 8-22 mins   PT G CodesGreggory Bass, PT, DPT [phone]   Crystal Bass 04/11/2018, 12:37 PM

## 2018-04-11 NOTE — ED Notes (Signed)
Pt resting in bed with NAD noted, eyes closed, lights dimmed for patient comfort. Will continue to monitor for further patient needs.

## 2018-04-11 NOTE — ED Notes (Signed)
Claudine, Education officer, museum at bedside at this time.

## 2018-04-11 NOTE — ED Notes (Signed)
Pt resting in bed watching TV. Denies any needs. Will continue to monitor for further patient needs.

## 2018-04-11 NOTE — ED Provider Notes (Signed)
-----------------------------------------   5:29 PM on 04/11/2018 -----------------------------------------  She was scheduled for discharge prior to my arrival here, she has not been taking all of her home medications, her blood pressure is elevated without chest pain shortness of breath headache or other symptoms, we are restarting her home blood pressure medications here and we will get her home safely.  She also has a prescription for clonazepam listed and there is no evidence of withdrawal from the last pain but I will give her a dose of that as well.   Schuyler Amor, MD 04/11/18 [phone]

## 2018-04-11 NOTE — Progress Notes (Signed)
LCSW called PT and patient will be seen in about 10 minutes. LCSW has room number and call report information but cant send her until PT eval is done.  Informed patient of care plan and will upload PT consult via the hub and consult with EDRN EDP and ED secretary once all is done.  [employer] LCSW [phone]

## 2018-04-11 NOTE — ED Notes (Signed)
Pt sleeping, awaiting ems transport.

## 2018-04-11 NOTE — ED Notes (Signed)
Pt repositioned in bed at this time. Pt also requesting to have blankets taken off of her feet for patient comfort. Pt remains alert and oriented at this time. NAD noted at this time.

## 2018-04-15 DIAGNOSIS — G20 Parkinson's disease: Secondary | ICD-10-CM | POA: Diagnosis not present

## 2018-04-15 DIAGNOSIS — I10 Essential (primary) hypertension: Secondary | ICD-10-CM | POA: Diagnosis not present

## 2018-04-15 DIAGNOSIS — S82002S Unspecified fracture of left patella, sequela: Secondary | ICD-10-CM | POA: Diagnosis not present

## 2018-04-15 DIAGNOSIS — S92151A Displaced avulsion fracture (chip fracture) of right talus, initial encounter for closed fracture: Secondary | ICD-10-CM | POA: Diagnosis not present

## 2018-04-15 DIAGNOSIS — J439 Emphysema, unspecified: Secondary | ICD-10-CM | POA: Diagnosis not present

## 2018-04-22 DIAGNOSIS — S9031XA Contusion of right foot, initial encounter: Secondary | ICD-10-CM | POA: Diagnosis not present

## 2018-04-25 ENCOUNTER — Encounter: Payer: Self-pay | Admitting: Family Medicine

## 2018-04-25 ENCOUNTER — Ambulatory Visit: Payer: Medicare Other | Admitting: Family Medicine

## 2018-04-25 VITALS — BP 120/76 | HR 63 | Temp 97.9°F | Ht 62.5 in

## 2018-04-25 DIAGNOSIS — S82035A Nondisplaced transverse fracture of left patella, initial encounter for closed fracture: Secondary | ICD-10-CM | POA: Diagnosis not present

## 2018-04-25 DIAGNOSIS — G20 Parkinson's disease: Secondary | ICD-10-CM | POA: Diagnosis not present

## 2018-04-25 DIAGNOSIS — S99911A Unspecified injury of right ankle, initial encounter: Secondary | ICD-10-CM

## 2018-04-25 DIAGNOSIS — S82002A Unspecified fracture of left patella, initial encounter for closed fracture: Secondary | ICD-10-CM | POA: Insufficient documentation

## 2018-04-25 NOTE — Assessment & Plan Note (Addendum)
Encouraged continued f/u with ortho. Reviewed tylenol dosing. She has been unable to use new brace - so is going to ortho office today for further teaching

## 2018-04-25 NOTE — Progress Notes (Signed)
BP 120/76 (BP Location: Left Arm, Patient Position: Sitting, Cuff Size: Normal)   Pulse 63   Temp 97.9 F (36.6 C) (Oral)   Ht 5' 2.5" (1.588 m)   SpO2 96%   BMI 27.00 kg/m    CC: ER f/u visit Subjective:    Patient ID: Crystal Bass, female    DOB: [birth year], 77 y.o.   MRN: [id number]  HPI: Crystal Bass is a 77 y.o. female presenting on 04/25/2018 for Follow-up (Here for f/u, per [facility] for left knee fx and right ankle sprain.  Seen at [facility] ED on 04/10/18.)   Alya is here today as ER f/u for recent patellar fracture. ER records from 04/10/2018 reviewed. Recent fall at [facility] resulting in minimally separated mid to inferior L patellar fracture and R ankle sprain (xrays showed possible dorsal cortical avulsion fracture off anterior talus and dorsal navicular). She was placed in knee immobilizer. The way she sits, feet tucked under her and fall asleep. Rubber on sole of shoes would get stuck on floor. When she got up, fell onto vinyl flooring. She has given away these shoes.   She saw Mia [place] at Dr Poggi's ortho office yesterday, note reviewed. She had repeat L knee and R ankle xrays. Placed in knee ROM brace locked in extension and ankle high cam walker boot - this has helped ankle pain. Planning to see podiatrist 05/01/2018. Ortho f/u planned in 2 weeks.   Continues using walker.  Taking 6 tylenol ES daily.   She has shower handle at home, has removed throw rugs.  CA 125 has increased. Recent abd [place] and CT abd/pelvis reviewed - Bosniak 2 renal cyst (rec rpt [place] 1 yr),   Relevant past medical, surgical, family and social history reviewed and updated as indicated. Interim medical history since our last visit reviewed. Allergies and medications reviewed and updated. Outpatient Medications Prior to Visit  Medication Sig Dispense Refill  . acetaminophen (TYLENOL) 500 MG tablet Take 2 tablets (1,000 mg total) by mouth 2 (two) times daily as needed.    . Albuterol Sulfate  ([name]) 062 (90 Base) MCG/ACT AEPB Inhale 2 puffs into the lungs every 6 (six) hours as needed (dyspnea, wheezing).    [name] aspirin 81 MG tablet Take 81 mg by mouth daily.      . carbidopa-levodopa ([name]) 25-100 MG tablet TAKE 2 TABLETS IN THE MORNING, 1 TABLET IN THE AFTERNOON, 1 IN THE EVENING 360 tablet 1  . clonazePAM (KLONOPIN) 0.5 MG tablet TAKE 0.5-1 TABLET BY MOUTH AT BEDTIME 90 tablet 1  . fluticasone ([name]) 110 MCG/ACT inhaler Inhale 2 puffs into the lungs 2 (two) times daily.    [name] gabapentin ([name]) 300 MG capsule Take 2 capsules (600 mg total) by mouth 3 (three) times daily. 540 capsule 3  . losartan (COZAAR) 50 MG tablet Take 1 tablet (50 mg total) by mouth daily. 90 tablet 3  . metoprolol tartrate (LOPRESSOR) 25 MG tablet Take 1 tablet (25 mg total) by mouth 2 (two) times daily. 180 tablet 3  . vitamin B-12 (CYANOCOBALAMIN) 1000 MCG tablet Take 1 tablet (1,000 mcg total) by mouth daily.    . Vitamin D, Ergocalciferol, ([name]) 50000 units CAPS capsule TAKE 1 CAPSULE BY MOUTH EVERY WEEK 12 capsule 3   No facility-administered medications prior to visit.      Per HPI unless specifically indicated in ROS section below Review of Systems     Objective:    BP  120/76 (BP Location: Left Arm, Patient Position: Sitting, Cuff Size: Normal)   Pulse 63   Temp 97.9 F (36.6 C) (Oral)   Ht 5' 2.5" (1.588 m)   SpO2 96%   BMI 27.00 kg/m   Wt Readings from Last 3 Encounters:  04/10/18 150 lb (68 kg)  02/27/18 150 lb (68 kg)  01/21/18 149 lb (67.6 kg)    Physical Exam  Constitutional: She appears well-developed and well-nourished. No distress.  Walks with walker  Cardiovascular: Normal rate, regular rhythm and normal heart sounds.  No murmur heard. Pulmonary/Chest: Effort normal and breath sounds normal. No respiratory distress. She has no wheezes. She has no rales.  Musculoskeletal:  L knee immobilizer in place R cam walker boot in place Cam walker  removed - swelling at lateral ankle and bruising throughout foot, point tender lateral ankle and navicular bone  Nursing note and vitals reviewed.  DG Ankle Complete Right CLINICAL DATA:  Mechanical fall with sprained right ankle  EXAM: RIGHT ANKLE - COMPLETE 3+ VIEW  COMPARISON:  None.  FINDINGS: Lateral soft tissue swelling. Possible dorsal cortical avulsion injury off the anterior talus and posterior navicular. Overlying soft tissue swelling. Small plantar calcaneal spur. Ankle mortise is symmetric  IMPRESSION: Soft tissue swelling. Possible dorsal cortical avulsion fracture injuries off the anterior talus and dorsal navicular.  Electronically Signed   By: Donavan Foil M.D.   On: 04/10/2018 22:30 DG Knee Complete 4 Views Left CLINICAL DATA:  Knee pain after mechanical fall  EXAM: LEFT KNEE - COMPLETE 4+ VIEW  COMPARISON:  None.  FINDINGS: Acute minimally displaced fracture involving the mid to inferior aspect of the patella with 6 mm fracture cleft. No significant knee effusion. Prepatellar soft tissue swelling. Mild degenerative changes of the medial compartment.  IMPRESSION: Best seen on oblique views is a minimally separated fracture involving the mid to inferior aspect of the patella  Electronically Signed   By: Donavan Foil M.D.   On: 04/10/2018 22:28  Assessment & Plan:   Problem List Items Addressed This Visit    Left patella fracture - Primary    Encouraged continued f/u with ortho. Reviewed tylenol dosing. She has been unable to use new brace - so is going to ortho office today for further teaching       Parkinson disease ([facility])    Increases fall risk.  Continue walker use.       Right ankle injury, initial encounter    ?of subtle avulsion fractures on ER xrays - pending podiatry appt next week for further evaluation. Continue CAM walker boot use for now.           No orders of the defined types were placed in this encounter.  No orders  of the defined types were placed in this encounter.   Follow up plan: Return if symptoms worsen or fail to improve.  [name], MD

## 2018-04-25 NOTE — Assessment & Plan Note (Addendum)
?  of subtle avulsion fractures on ER xrays - pending podiatry appt next week for further evaluation. Continue CAM walker boot use for now.

## 2018-04-25 NOTE — Patient Instructions (Addendum)
Keep upcoming appointments.  Good to see you today.  Continue tylenol, max 6 per day.

## 2018-04-25 NOTE — Assessment & Plan Note (Signed)
Increases fall risk.  Continue walker use.

## 2018-05-02 ENCOUNTER — Other Ambulatory Visit: Payer: Self-pay | Admitting: Neurology

## 2018-05-06 ENCOUNTER — Ambulatory Visit: Payer: Medicare Other | Admitting: Internal Medicine

## 2018-06-26 ENCOUNTER — Other Ambulatory Visit: Payer: Self-pay | Admitting: Neurology

## 2018-06-26 NOTE — Telephone Encounter (Signed)
Please sign

## 2018-07-14 NOTE — Progress Notes (Signed)
Crystal Bass was seen today in the movement disorders clinic for neurologic consultation at the request of [name], MD.  The consultation is for the evaluation of tremor.  Pt states that it has been going on over a year.  It is mostly in the R hand and rarely in the L hand.  "It is almost constant but not quite."  She notices it when grasping the steering wheel.  Eating was troublesome but better now that d/c the symbicort.  Symbicort worsened tremor by 80%.  Paternal aunt with tremor.  05/17/15 update:  Last visit, I diagnosed the patient with Parkinson's disease and started her on carbidopa/levodopa 25/100, one tablet 3 times per day.  She states that for the first 6 weeks she had diarrhea but it got better and it went away.  She does think that the tremor is better but does know that stress will trigger the tremor.  She also states that speech therapy seems to set off tremor.  She was started in physical therapy at [facility].  She has noted that she is waking up from dreams yelling and acting out dreams.  She does have a long history of migraine headaches and last visit had wanted me to prescribe Imitrex.  I was very nervous that her age and did not feel that she was likely a triptan candidate.  I ended up ordering an MRI of the brain on 02/28/2015.  I reviewed this and there was evidence of moderate small vessel disease.  I subsequently told her that she was not a candidate for the triptans.  She also had an MRI of the cervical spine on a performed, 2016.  There was mild multilevel cervical spondylosis.  Unfortunately, she has had several headaches since last visit that were fairly severe.  She did see Dr. [name] since last visit in regards to dysphagia and she states that it was scheduled but she ended up having to reschedule it.  08/23/15 update:  The patient is following up today.  She remains on carbidopa/levodopa 25/100, one tablet 3 times per day (8:30am/2pm/10PM).  She does have some  diarrhea that she associates with taking the pill but feels it is better than when she first started taking it.  She also states that it takes her until mid afternoon to feel normal and just feels slow in the morning.  She was started on clonazepam last visit for REM behavior disorder and that has helped dramatically.  She has had some spells of weakness in which her legs give out.  It may happen a few times a week.  She does not relate that to the addition of clonazepam at all.  No falls.  No hallucinations.  She was exercising until her back started hurting again.   In regards to her migraines, I gave her a prescription for Cambia last visit.  She is not a candidate for the triptans any longer.  She picked it up but hasn't had to use it yet.  She is on neurontin 600 mg bid for post surgical pain (states that she had mastectomy and open lung biopsy and then had terrible paresthesias).  Is still like that if doesn't take the neurontin, especially if she has a bra on.  She does complain that she thinks she is not thinking as clear as she used to.  11/28/14 update:  The patient is following up today.  Last visit, I increased her carbidopa/levodopa so that she is taking carbidopa/levodopa 25/100, 2  tablets in the morning, one in the afternoon and one in the evening.  I was hoping that this would help her morning "on."  Today, the patient states that she just has no energy and she is unsure if it is related to the medication; it started before the medication increase.  She thinks that the energy does get a bit better throughout the day.  She is on the klonopin and "I don't want to give that up as I don't dream anymore."  She has so little energy that she has not exercised much.  Does want to go back to PT at [facility].  She is not snoring at night that she knows of.  She's had no falls since last visit.  No hallucinations.  No lightheadedness or near syncope.  She has a has a history of rare migraine.  She does have  Cambia at home but has not had to use that.  She had a "bad" headache last night but got rid of it with tylenol, aleve, and coke.  Also c/o issues with BM's - both constipation and diarrhea.  03/01/16 update: The patient is following up today regarding her Parkinson's disease.  I have had the opportunity to review her prior records made available to me.  I last visit, she was complaining about loss of energy.  It was not sure that this was really related to her medications, but we decided to strategically hold medications.  She held her levodopa for about 4 days, because she thought that that was the culprit.  She felt no different in terms of energy off of the medication and ended up going back on the carbidopa/levodopa 25/100, but for some reason she went from 2 in the morning, one in the afternoon and one in the evening to 1 tablet three times a day (states that she didn't think that the higher dose made a difference).  We subsequently held her clonazepam and her energy may have been a little bit better, but she felt that it was certainly not worth being off of the medication, because she has terrible dreams and acting out the dreams off of the medications.  We decided subsequently to that selegiline for its amphetamine-like properties, but she decided not to take the medication after it was prescribed.  She reported that she wanted to try to increase her energy through exercise.  She is doing the BIG/LOUD exercises at home and walks some.  Brings in a [facility] [employer] form for exercise today.  No hallucination but rare visual distortion.  She has had some headaches but hasn't tried the cambia.    06/01/16 update:  The patient is following up today.  Last visit, I encouraged the patient to increase her levodopa back to 2 tablets in the morning, one in the afternoon and one evening, because every time she decreased it she became more rigid.  She states that she did that and she is doing "okay."  She also has a history  of REM behavior disorder and is on half a tablet of clonazepam at nighttime.  She denies falls since last visit.  No hallucinations.  May have occasional lightheadedness but no near syncope.  Thinks that she has "muscle weakness" but points to the neck and states that it is painful and then will feel like the head will tumble off.  Asks me about "posture PT" at [facility].    10/02/16 update:  Pt f/u today.  Pt is on carbidopa/levodopa 25/100, 2 in  the AM, 1 in the afternoon and 1 in the evening.  Still on klonopin 0.5 mg, 1/2 tablet q hs.  No falls since our last visit.  No hallucinations.  No increased lightheadedness.  No syncope.  Mood has been fair.  Doing at least 1.25 miles per day on her [place] for exercise.  02/05/17 update:  Patient follows up today.  She is on carbidopa/levodopa 25/100, 2 tablets in the morning, one in the afternoon and one in the evening.  Reports that she has been doing well since our last visit.  Minimal tremor.  No falls.  No lightheadedness or near syncope.  She takes clonazepam, 0.5 mg, half tablet at night and does well with this.  She sleeps well.  No acting out of the dreams.  When asked about exercise, she states that "I was doing well until winter hit."  In regards to headache, she was having more than usual and was taking 4 tylenol per day but she stopped doing that.    06/12/17 update:  Patient seen in follow-up regarding her Parkinson's disease.  She remains on carbidopa/levodopa 25/100, 2 tablets in the morning, one in the afternoon and one in the evening.  She is also on clonazepam, 0.5 mg, half tablet at night.  She has had a few occasions of yelling out in the night.  Pt denies falls.  Pt has occasional lightheadedness and she will need to sit.  Its worse after she sits.    No hallucinations.  Mood has been good.  States that she is most concerned about cognition.  She will forget what she is saying in the middle of a sentence.  She is having some trouble with  forgetting names.  She puts her pills on the counter so she can see them and therefore she remembers them.  She remembers to pay bills.  She has no trouble driving.    10/25/17 update: Patient is seen in follow-up today regarding her Parkinson's disease.  She is on carbidopa/levodopa 25/100, 2/1/1.  Pt denies falls but she states that she has been very careful.  She will occasionally feel like her legs give way.  She is not exercising because she feels weak from the time she gets up until about 1-2pm.    Pt denies lightheadedness, near syncope.  No hallucinations.  Mood has been good.  She remains on clonazepam 0.5 mg, half a tablet at night for REM behavior disorder.  She has not had any acting out of the dreams or falling out of bed.  She has had one episode of screaming in the middle of the night.  She is having a problem with sleep.  they moved her at [facility] from her apartment to a new apartment.  She is close to industrial dryers that go all night and interrupt her sleep. The records that were made available to me were reviewed.  She saw Dr. Si Raider and had neurocognitive testing on August 15, 2017.  She has reviewed the results with Dr. Si Raider.  There is no evidence of cognitive impairment.  There is no evidence of a psychiatric disorder.    02/27/18 update: Patient is seen today in follow-up for Parkinson's disease.  She is on carbidopa/levodopa 25/100, 2 tablets in the morning, 1 in the afternoon and 1 in the evening.  She is still very tired in the AM.  She has trouble deciphering if weakness or fatigue.  Its a chore to take a shower.  No falls since  last visit.  No lightheadedness or near syncope.  She is on clonazepam 0.5 mg, half tablet at night for REM behavior disorder.  This is working well.  No acting out of dreams.  She does have pleasant dreams Records have been reviewed since our last visit.  She has been to outpatient therapy.  07/17/18 update: Patient is seen today in follow-up for  Parkinson's disease.  Patient is on carbidopa/levodopa 25/100, 2 tablets in the morning, 1 in the afternoon and 1 in the evening. She is having lightheadedness and notes that sometimes when she stands BP is in the 27'O systolic! She doesn't feel good when that happens.  Tried to go off one BP med in past and BP was 536U systolic.  has low energy. Clonazepam is working well for her REM behavior disorder.  She has not had any acting out of the dreams.  Records have been reviewed since our last visit.  She was admitted to the hospital in May after a fall.  She reports that she had been sitting down on her leg and her leg was asleep and she fell because of it.   X-ray of the knee demonstrated minimally separated fracture involving the mid to inferior aspect of the patella.  Ankle x-ray demonstrated possible avulsion fracture.  She has had ongoing knee pain since.  She saw orthopedics on June 09, 2018 and I reviewed those records.  She was doing better at that visit with knee pain and was told that she could come out of her hinged knee brace.  She declined steroid injections of the knee.  It is better than it was but it is still having some pain.  She is not back to exercise yet.    PREVIOUS MEDICATIONS: none to date  ALLERGIES:   Allergies  Allergen Reactions  . Sulfonamide Derivatives Anaphylaxis  . Topamax [Topiramate] Other (See Comments)    Metabolic acidosis   . Biaxin [Clarithromycin] Other (See Comments)    pericarditis  . Hydrocodone Other (See Comments)    "hyper and climbing the walls"  . Motrin [Ibuprofen] Other (See Comments)    headaches  . Symbicort [Budesonide-Formoterol Fumarate]     02/07/15 tremor  . Percocet [Oxycodone-Acetaminophen] Itching and Rash  . Tape Itching and Rash    Use paper tape only  . Tetracyclines & Related Other (See Comments)    "immediate yeast infection"    CURRENT MEDICATIONS:  Outpatient Encounter Medications as of 07/17/2018  Medication Sig  .  acetaminophen (TYLENOL) 500 MG tablet Take 2 tablets (1,000 mg total) by mouth 2 (two) times daily as needed.  . Albuterol Sulfate ([name]) 440 (90 Base) MCG/ACT AEPB Inhale 2 puffs into the lungs every 6 (six) hours as needed (dyspnea, wheezing).  [name] aspirin 81 MG tablet Take 81 mg by mouth daily.    . carbidopa-levodopa ([name]) 25-100 MG tablet TAKE 2 TABLETS IN THE MORNING, 1 TABLET IN THE AFTERNOON, 1 IN THE EVENING  . clonazePAM (KLONOPIN) 0.5 MG tablet TAKE 1/2 TABLET BY MOUTH AT BEDTIME  . fluticasone ([name]) 110 MCG/ACT inhaler Inhale 2 puffs into the lungs 2 (two) times daily.  [name] gabapentin ([name]) 300 MG capsule Take 2 capsules (600 mg total) by mouth 3 (three) times daily.  [name] losartan (COZAAR) 50 MG tablet Take 1 tablet (50 mg total) by mouth daily.  . metoprolol tartrate (LOPRESSOR) 25 MG tablet Take 1 tablet (25 mg total) by mouth 2 (two) times daily.  . vitamin  B-12 (CYANOCOBALAMIN) 1000 MCG tablet Take 1 tablet (1,000 mcg total) by mouth daily.  . Vitamin D, Ergocalciferol, ([name]) 50000 units CAPS capsule TAKE 1 CAPSULE BY MOUTH EVERY WEEK   No facility-administered encounter medications on file as of 07/17/2018.     PAST MEDICAL HISTORY:   Past Medical History:  Diagnosis Date  . 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 Aortic root aneurysm ([place])   . Breast cancer ([place]) 2002   infiltrative ductal carcinoma   . Cataract 2019  . COPD (chronic obstructive pulmonary disease) ([place]) 9/08  . Ductal carcinoma of breast, estrogen receptor positive, stage 1 ([place]) 09/16/2012  . Endometriosis   . Hypercalcemia 09/14/2013  . Hypertension   . Lesion of breast 1992   right, benign  . Lung disease    secondary to [name] infection  . Mastalgia 7/95  . Osteoporosis, post-menopausal 03/14/2012  . Ovarian cancer ([place]) 2004  . Pancreatitis    secondary to Cholelithiasis  . Parkinson disease ([place]) 02/10/15  . Post-thoracotomy pain  syndrome   . Pseudomonas pneumonia ([place]) 5/09  . Uterine fibroid   . Vitamin D deficiency     PAST SURGICAL HISTORY:   Past Surgical History:  Procedure Laterality Date  . 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 APPENDECTOMY  2004  . BREAST BIOPSY  08/2004   right breast-benign  . BREAST LUMPECTOMY  1992   benign  . CARDIAC CATHETERIZATION  2007   essentially negation for significant CAD  . CHOLECYSTECTOMY  2001  . COLONOSCOPY  2014   Dr [name] , due 2019  . LUNG BIOPSY  2002   [name], Dr [name]  . MASTECTOMY MODIFIED RADICAL Left 2002   oral chemotheraphy (tamoxifen then Armidex) no radiation, Dr.[name]  . TONSILLECTOMY  1946    SOCIAL HISTORY:   Social History   Socioeconomic History  . Marital status: Widowed    Spouse name: Not on file  . Number of children: 0  . Years of education: Not on file  . Highest education level: Not on file  Occupational History  . 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 Financial resource strain: Not on file  . Food insecurity:    Worry: Not on file    Inability: Not on file  . Transportation needs:    Medical: Not on file    Non-medical: Not on file  Tobacco Use  . Smoking status: Never Smoker  . Smokeless tobacco: Never Used  Substance and Sexual Activity  . Alcohol use: No    Alcohol/week: 0.0 standard drinks  . Drug use: No  . Sexual activity: Never    Birth control/protection: Surgical    Comment: TAH/BSO  Lifestyle  . Physical activity:    Days per week: Not on file    Minutes per session: Not on file  . Stress: Not on file  Relationships  . Social connections:    Talks on phone: Not on file    Gets together: Not on file    Attends religious service: Not on file    Active member of club or organization: Not on file    Attends meetings of clubs or organizations: Not on file    Relationship status: Not on file    . Intimate partner violence:    Fear of current or ex partner: Not on  file    Emotionally abused: Not on file    Physically abused: Not on file    Forced sexual activity: Not on file  Other Topics Concern  . 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 Father  Deceased       lung cancer  . Sister  Alive       colon/breast cancer  . Mother  Deceased       emphysema  . Brother  Alive       healthy  . Ethlyn Daniels  (Not Specified)  . Ethlyn Daniels  (Not Specified)  . [place] Deceased  . [place] Deceased  . MGM  Deceased  . MGF  Deceased  . PGM  Deceased  . PGF  Deceased  . [place] Deceased  . [place] Deceased  . [place] Deceased  . Ethlyn Daniels  (Not Specified)  . [place] Deceased  . Cousin  Deceased  . Cousin Jinny Sanders and Wiley Deceased  . Neg Hx  (Not Specified)    ROS:  A complete 10 system review of systems was obtained and was unremarkable apart from what is mentioned above.  PHYSICAL EXAMINATION:    VITALS:   Vitals:   07/17/18 1254 07/17/18 1255  BP: 120/68 92/60  Pulse: 68   SpO2: 93%   Weight: 144 lb (65.3 kg)   Height: 5' 2.5" (1.588 m)     GEN:  The patient appears stated age and is in NAD. HEENT:  Normocephalic, atraumatic.  The mucous membranes are moist. The superficial temporal arteries are without ropiness or tenderness. CV:  RRR Lungs:  CTAB Neck/HEME:  There are no carotid bruits bilaterally.  Neurological examination:  Orientation:  Montreal Cognitive Assessment  10/02/2016  Visuospatial/ Executive (0/5) 4  Naming (0/3) 3  Attention: Read list of digits (0/2) 2  Attention: Read list of letters  (0/1) 1  Attention: Serial 7 subtraction starting at 100 (0/3) 3  Language: Repeat phrase (0/2) 2  Language : Fluency (0/1) 1  Abstraction (0/2) 2  Delayed Recall (0/5) 3  Orientation (0/6) 6  Total 27  Adjusted Score (based on education) 27    Orientation: The patient is alert and oriented x3. Cranial nerves: There is good facial symmetry. The speech is fluent and clear. Soft palate rises symmetrically and there is no tongue deviation. Hearing is intact to conversational tone. Sensation: Sensation is intact to light touch throughout Motor: Strength is 5/5 in the bilateral upper and lower extremities.   Shoulder shrug is equal and symmetric.  There is no pronator drift.  Movement examination: Tone: There is normal tone in the RUE.  There is normal tone in the LUE.  There is normal tone in the RLE.  There is normal tone in the LLE.  Abnormal movements: none Coordination:  There is no decremation with RAM's, with any form of RAMS, including alternating supination and pronation of the forearm, hand opening and closing, finger taps, heel taps and toe taps. Gait and Station: The patient has no difficulty arising out of a deep-seated chair without the  use of the hands. The patient's stride length is normal with good arm swing bilaterally.   LABS  Lab Results  Component Value Date   WBC 6.7 03/28/2017   HGB 13.7 03/28/2017   HCT 41.4 03/28/2017   MCV 95.1 03/28/2017   PLT 210 03/28/2017     Chemistry      Component Value Date/Time   NA 136 01/14/2018 1121   NA 141 03/28/2017 1130   K 4.5 01/14/2018 1121   K 4.2 03/28/2017 1130   CL 103 01/14/2018 1121   CL 102 03/10/2013 1529   CO2 29 01/14/2018 1121   CO2 28 03/28/2017 1130   BUN 15 01/14/2018 1121   BUN 17.3 03/28/2017 1130   CREATININE 0.77 01/14/2018 1121   CREATININE 0.8 03/28/2017 1130      Component Value Date/Time   CALCIUM 9.3 01/14/2018 1121   CALCIUM 9.6 03/28/2017 1130   ALKPHOS 85 03/28/2017 1130   AST 12  03/28/2017 1130   ALT <6 03/28/2017 1130   BILITOT 1.38 (H) 03/28/2017 1130       ASSESSMENT/PLAN:  1.   idiopathic Parkinson's disease.  The patient has tremor, bradykinesia, rigidity and mild postural instability.  Sx's have been present for at least since March, 2015 by the patients account .    -She will continue on carbidopa/levodopa 25/100, 2 tablets in the morning, 1 in the afternoon and one evening.  Risks, benefits, side effects and alternative therapies were discussed.  The opportunity to ask questions was given and they were answered to the best of my ability.  The patient expressed understanding and willingness to follow the outlined treatment protocols.  -invited to PARTS  -encouraged back to exercise when she can 2. Fatigue  -offered selegeline but she wants to hold on that.  Will let me know if changes her mind (she was worried about SE)  -wonder if related to [facility] 3.  Long hx migraine  -No longer a candidate for triptan therapy.  This is due to cerebral small vessel disease.  She does have Cambia for when necessary use.  Headaches have been better lately 5.  Mild orthostasis  -better now that not on 2 different BP meds.  Will continue to monitor    6.  Neck pain/hyperreflexia  -MRI cervical spine in April, 2016 just demonstrated mild multilevel cervical spondylosis. 7.  REM behavior disorder  -Doing well with low-dose clonazepam, 0.5 mg, half tablet at night.  Risks, benefits, side effects and alternative therapies were discussed.  The opportunity to ask questions was given and they were answered to the best of my ability.  The patient expressed understanding and willingness to follow the outlined treatment protocols. 8.  Mild cognitive impairment  -She saw Dr. Si Raider and had neurocognitive testing on August 15, 2017.  She has reviewed the results with Dr. Si Raider.  There is no evidence of cognitive impairment, including dementia.  There is no evidence of a psychiatric  disorder. 9.  [place]  -Discussed concept of permissive hypertension with her.  She is currently on both metoprolol and Cozaar.  Had dramatic decrease in systolic blood pressure from sitting to standing.  Will talk with her primary care physician to see if we can make a change in her medications.

## 2018-07-17 ENCOUNTER — Ambulatory Visit: Payer: Medicare Other | Admitting: Neurology

## 2018-07-17 ENCOUNTER — Encounter: Payer: Self-pay | Admitting: Neurology

## 2018-07-17 VITALS — BP 92/60 | HR 68 | Ht 62.5 in | Wt 144.0 lb

## 2018-07-17 DIAGNOSIS — R5383 Other fatigue: Secondary | ICD-10-CM | POA: Diagnosis not present

## 2018-07-17 DIAGNOSIS — G903 Multi-system degeneration of the autonomic nervous system: Secondary | ICD-10-CM

## 2018-07-17 DIAGNOSIS — G20 Parkinson's disease: Secondary | ICD-10-CM

## 2018-07-17 DIAGNOSIS — G20A1 Parkinson's disease without dyskinesia, without mention of fluctuations: Secondary | ICD-10-CM

## 2018-07-29 ENCOUNTER — Telehealth: Payer: Self-pay | Admitting: Neurology

## 2018-07-29 NOTE — Telephone Encounter (Signed)
Patient states she has not heard back about her blood pressure medication. She states we were to contact Dr. [name] and let her know about the medication. Dr. Carles Collet - did you hear back from him?

## 2018-07-29 NOTE — Telephone Encounter (Signed)
Patient called and left a voicemail stating that she needed to speak with the nurse. Please call her back at [phone]. Thanks.

## 2018-07-29 NOTE — Telephone Encounter (Signed)
No, but I will send staff message today.  I previously sent message when I routed the note.  She can also call and f/u with him/his office if she doesn't hear back from [place].

## 2018-07-30 ENCOUNTER — Telehealth: Payer: Self-pay | Admitting: Family Medicine

## 2018-07-30 NOTE — Telephone Encounter (Signed)
PCP contacting patient to stop Losartan.

## 2018-07-30 NOTE — Telephone Encounter (Signed)
Spoke with pt relaying Dr. G's message and instructions.  Pt verbalizes understanding. 

## 2018-07-30 NOTE — Telephone Encounter (Signed)
plz call patient: I received notice from Dr Tat that her blood pressures were dropping very low - I recommend she go ahead and stop her losartan (cozaar) 50mg . Continue metoprolol for now. Keep track of her blood pressures over the next 2 weeks - both sitting and standing if she can - and then let us know how they're running.  Let me know if any questions.

## 2018-07-30 NOTE — Addendum Note (Signed)
Addended by: [name] on: 07/30/2018 09:31 AM   Modules accepted: Orders

## 2018-08-13 ENCOUNTER — Telehealth: Payer: Self-pay | Admitting: Obstetrics & Gynecology

## 2018-08-13 ENCOUNTER — Telehealth: Payer: Self-pay | Admitting: Family Medicine

## 2018-08-13 DIAGNOSIS — Z1211 Encounter for screening for malignant neoplasm of colon: Secondary | ICD-10-CM

## 2018-08-13 NOTE — Telephone Encounter (Signed)
Patient called and stated that she was scheduled for an endoscopy and colonoscopy, but had gotten injured and had to postpone it. Patient said she is now ready to reschedule that. She stated that it was set up with [facility], but she would prefer it to be set up with [facility] with Dr. [name], as that is who she has seen previously.

## 2018-08-13 NOTE — Telephone Encounter (Signed)
Copied from [place] [id number]. Topic: Quick Communication - See Telephone Encounter >> Aug 13, 2018  2:23 PM Burchel, Abbi R wrote: CRM for notification. See Telephone encounter for: 08/13/18.  Pt states she was instructed by Dr Darnell Level to keep a record of her BP readings for 2 wks.   BP Readings:  08/01/18  Sitting: 164/101   P: 68 Standing: 122/71   P: 76    08/02/18 Sitting: 165/95  P: 67 Standing: 124/83  P: 75  08/03/18 Sitting:  158/94  P: 62 Standing:  112/75 P: 73  08/04/18 Sitting: 110/72  P: 76 Standing: 174/89  P: 76  08/05/18 Sitting:  169/99 Standing:  101/71  08/06/18 (Am) Sitting: 149/93  P: 64 (Am) Standing: 118/78  P: 75 (Pm) Sitting: 170/103  P: 60 (Pm) Standing:  127/80 P: 74  08/07/18 (Am) Sitting: 150/98  P: 64 (Am) Standing: 98/67  P: 76 (Pm) Sitting: 179/102   P: 60 (Pm) Standing: 134/87   P: 76  08/08/18 Sitting: 177/105   P: 63 Standing: 181/84   P: 75  08/09/18 Sitting: 67/44 P: 68  08/10/18   Sitting: 182/102  P: 66 Standing: 130/78 P:  73  08/11/18 Sitting: 181/97  P: 67 Standing: 123/88  P: 74  08/12/18 Sitting: 165/94  P: 77 Standing: 80/58  P: 79  08/13/18 Sitting: 176/98  P: 63 Standing: 119/81 P: 74

## 2018-08-13 NOTE — Telephone Encounter (Signed)
Referral placed for Dr. [name] at [facility].  Please process referral and contact patient. Thank you!

## 2018-08-15 NOTE — Telephone Encounter (Signed)
Spoke with pt asking how she is feeling. Says she feels good until she gets that feeling of BP is low. States standing dizziness has improved, not happening as frequently. I relayed Dr. Synthia Innocent instructions and message.  Pt verbalizes understanding. Fyi to Dr. Darnell Level.

## 2018-08-15 NOTE — Telephone Encounter (Signed)
plz call - standing blood pressures are better, but still having some low readings. How is she feeling overall? If dizziness with standing is improving, would recommend continue current regimen. If having ongoing postural lightheadedness/dizziness, let me know.

## 2018-08-21 ENCOUNTER — Emergency Department
Admission: EM | Admit: 2018-08-21 | Discharge: 2018-08-21 | Disposition: A | Discharge status: Home / Self Care | Payer: Medicare Other | Attending: Emergency Medicine | Admitting: Emergency Medicine

## 2018-08-21 ENCOUNTER — Other Ambulatory Visit: Payer: Self-pay

## 2018-08-21 ENCOUNTER — Emergency Department: Payer: Medicare Other

## 2018-08-21 ENCOUNTER — Ambulatory Visit: Payer: Self-pay | Admitting: *Deleted

## 2018-08-21 DIAGNOSIS — I10 Essential (primary) hypertension: Secondary | ICD-10-CM | POA: Diagnosis not present

## 2018-08-21 DIAGNOSIS — J449 Chronic obstructive pulmonary disease, unspecified: Secondary | ICD-10-CM | POA: Insufficient documentation

## 2018-08-21 DIAGNOSIS — J441 Chronic obstructive pulmonary disease with (acute) exacerbation: Secondary | ICD-10-CM

## 2018-08-21 DIAGNOSIS — Z853 Personal history of malignant neoplasm of breast: Secondary | ICD-10-CM | POA: Diagnosis not present

## 2018-08-21 DIAGNOSIS — Z79899 Other long term (current) drug therapy: Secondary | ICD-10-CM | POA: Diagnosis not present

## 2018-08-21 DIAGNOSIS — R0602 Shortness of breath: Secondary | ICD-10-CM | POA: Diagnosis present

## 2018-08-21 DIAGNOSIS — Z7982 Long term (current) use of aspirin: Secondary | ICD-10-CM | POA: Insufficient documentation

## 2018-08-21 LAB — CBC
HCT: 39.1 % (ref 35.0–47.0)
HEMOGLOBIN: 13.4 g/dL (ref 12.0–16.0)
MCH: 32.4 pg (ref 26.0–34.0)
MCHC: 34.2 g/dL (ref 32.0–36.0)
MCV: 94.5 fL (ref 80.0–100.0)
Platelets: 188 10*3/uL (ref 150–440)
RBC: 4.14 MIL/uL (ref 3.80–5.20)
RDW: 14.6 % — ABNORMAL HIGH (ref 11.5–14.5)
WBC: 6.4 10*3/uL (ref 3.6–11.0)

## 2018-08-21 LAB — BASIC METABOLIC PANEL
ANION GAP: 9 (ref 5–15)
BUN: 20 mg/dL (ref 8–23)
CALCIUM: 9.5 mg/dL (ref 8.9–10.3)
CHLORIDE: 104 mmol/L (ref 98–111)
CO2: 26 mmol/L (ref 22–32)
CREATININE: 0.88 mg/dL (ref 0.44–1.00)
GFR calc non Af Amer: >60 mL/min (ref >60)
Glucose, Bld: 103 mg/dL — ABNORMAL HIGH (ref 70–99)
Potassium: 3.6 mmol/L (ref 3.5–5.1)
Sodium: 139 mmol/L (ref 135–145)

## 2018-08-21 LAB — TROPONIN I

## 2018-08-21 MED ORDER — SODIUM CHLORIDE 0.9 % IV SOLN: 500.0000 mg | Freq: Once | INTRAVENOUS | Status: DC

## 2018-08-21 MED ORDER — SODIUM CHLORIDE 0.9 % IV SOLN
1.0000 g | Freq: Once | INTRAVENOUS | Status: DC
  Filled 2018-08-21: qty 10

## 2018-08-21 MED ORDER — ALBUTEROL SULFATE (2.5 MG/3ML) 0.083% IN NEBU
5.0000 mg | INHALATION_SOLUTION | Freq: Once | RESPIRATORY_TRACT | Status: AC
  Administered 2018-08-21: 5 mg via RESPIRATORY_TRACT
  Filled 2018-08-21: qty 6

## 2018-08-21 MED ORDER — PREDNISONE 20 MG PO TABS: 40.0000 mg | ORAL_TABLET | Freq: Every day | ORAL | 0 refills | Status: AC

## 2018-08-21 NOTE — Telephone Encounter (Signed)
Charlyne Quale, RN from [facility] stated that the pt came in for a visit but was "having problems with her asthma"; she says that the pt was audibly wheezing and did not respond to the albuterol treatment that she received in the office; she also states that the pt was having orthostatic hypertension; advised that pt should be call 911 and be seen at the closest ED; she verbalizes understanding; also informed the pt; she too verbalizes understanding; will route to office for notification of this encounter.   Reason for Disposition . Sounds like a life-threatening emergency to the triager  Answer Assessment - Initial Assessment Questions Information obtained from Charlyne Quale, RN at [facility]; pt not responding to albuterol nebulizer given in the office, and she is having orthostatic hypertension   1. RESPIRATORY STATUS: "Describe your breathing?" (e.g., wheezing, shortness of breath, unable to speak, severe coughing)      *No Answer* 2. ONSET: "When did this asthma attack begin?"      *No Answer* 3. TRIGGER: "What do you think triggered this attack?" (e.g., URI, exposure to pollen or other allergen, tobacco smoke)      *No Answer* 4. PEAK EXPIRATORY FLOW RATE (PEFR): "Do you use a peak flow meter?" If so, ask: "What's the current peak flow? What's your personal best peak flow?"      *No Answer* 5. SEVERITY: "How bad is this attack?"    - MILD: No SOB at rest, mild SOB with walking, speaks normally in sentences, can lay down, no retractions, pulse < 100. (GREEN Zone: PEFR 80-100%)   - MODERATE: SOB at rest, SOB with minimal exertion and prefers to sit, cannot lie down flat, speaks in phrases, mild retractions, audible wheezing, pulse 100-120. (YELLOW Zone: PEFR 50-80%)    - SEVERE: Very SOB at rest, speaks in single words, struggling to breathe, sitting hunched forward, retractions, usually loud wheezing, sometimes minimal wheezing because of decreased air movement, pulse >  120. (RED Zone: PEFR < 50%).      *No Answer* 6. MEDICATIONS (Inhaler or nebs): "What are your asthma medications?" and "What treatments have you given so far?"    - Quick-relief: albuterol, metaproterenol, salbutamol, or other inhaled or nebulized beta-agonist medicines   - Long-term-control: steroids, cromolyn, or other anti-inflammatory medicines.     *No Answer* 7. OTHER SYMPTOMS: "Do you have any other symptoms? (e.g., runny nose, chest pain, fever)     *No Answer* 8. PREGNANCY: "Is there any chance you are pregnant?" "When was your last menstrual period?"     *No Answer*  Protocols used: ASTHMA ATTACK-A-AH

## 2018-08-21 NOTE — Telephone Encounter (Signed)
Per chart review tab pt is at [facility] ED. 

## 2018-08-21 NOTE — ED Triage Notes (Signed)
Pt in via POV; sent from [facility] due to increased shortness of breath and orthostatic hypotension.  Pt reports hx of Emphysema, Asthma w/ some shortness of breath at baseline, states shortness of breath worse today.  Pt denies any pain, pt tachypneic upon arrival, other vitals WDL.

## 2018-08-21 NOTE — Discharge Instructions (Addendum)
Take the prednisone as prescribed and finish the full course.  You should use your albuterol inhaler at home every 4-6 hours for the next few days.  Follow-up with your doctor.  Return to the ER for new, worsening, persistent severe shortness of breath, weakness or lightheadedness, fevers, or any other new or worsening symptoms that concern you.

## 2018-08-21 NOTE — ED Provider Notes (Signed)
[facility] Emergency Department Provider Note ____________________________________________   First MD Initiated Contact with Patient 08/21/18 2144     (approximate)  I have reviewed the triage vital signs and the nursing notes.   HISTORY  Chief Complaint Shortness of Breath    HPI Crystal Bass is a 77 y.o. female with PMH as noted below who presents with shortness of breath, acute onset this afternoon when the patient was at her allergist office, now resolved, and not associated with any fever or chest pain.  The patient states that she was feeling fine until today.  She also apparently had orthostatic hypotension.  Past Medical History:  Diagnosis Date  . 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 Aortic root aneurysm ([place])   . Breast cancer ([place]) 2002   infiltrative ductal carcinoma   . Cataract 2019  . COPD (chronic obstructive pulmonary disease) ([place]) 9/08  . Ductal carcinoma of breast, estrogen receptor positive, stage 1 ([place]) 09/16/2012  . Endometriosis   . Hypercalcemia 09/14/2013  . Hypertension   . Lesion of breast 1992   right, benign  . Lung disease    secondary to [name] infection  . Mastalgia 7/95  . Osteoporosis, post-menopausal 03/14/2012  . Ovarian cancer ([place]) 2004  . Pancreatitis    secondary to Cholelithiasis  . Parkinson disease ([place]) 02/10/15  . Post-thoracotomy pain syndrome   . Pseudomonas pneumonia ([place]) 5/09  . Uterine fibroid   . Vitamin D deficiency     Patient Active Problem List   Diagnosis Date Noted  . Left patella fracture 04/25/2018  . Right ankle injury, initial encounter 04/25/2018  . Renal cyst, acquired, right 02/02/2018  . Advanced care planning/counseling discussion 01/26/2018  . Liver lesion 01/21/2018  . Low serum vitamin B12 01/21/2018  . Right-sided chest wall pain 01/21/2018  . Chronic fatigue 10/23/2017  . Lesion of left lung 10/23/2017  . Genetic testing  05/01/2017  . Encounter for general adult medical examination with abnormal findings 12/27/2016  . Parkinson disease ([place]) 01/10/2016  . DNR (do not resuscitate) 05/22/2015  . Tremor 02/07/2015  . Dysphagia, pharyngoesophageal phase 02/07/2015  . Hypercalcemia 09/14/2013  . Unspecified vitamin D deficiency 04/27/2013  . Breast cancer of upper-outer quadrant of left female breast ([place]) 09/16/2012  . Osteoporosis, post-menopausal 03/14/2012  . Aortic aneurysm ([place]) 01/01/2011  . OSTEOARTHRITIS, CERVICAL SPINE 08/15/2009  . ALLERGIC RHINITIS 03/25/2008  . Chronic obstructive airway disease with asthma ([place]) 03/25/2008  . BREAST CANCER, HX OF 03/10/2008  . Mixed hyperlipidemia 02/10/2007  . Essential hypertension 02/10/2007    Past Surgical History:  Procedure Laterality Date  . 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 APPENDECTOMY  2004  . BREAST BIOPSY  08/2004   right breast-benign  . BREAST LUMPECTOMY  1992   benign  . CARDIAC CATHETERIZATION  2007   essentially negation for significant CAD  . CHOLECYSTECTOMY  2001  . COLONOSCOPY  2014   Dr [name] , due 2019  . LUNG BIOPSY  2002   [name], Dr [name]  . MASTECTOMY MODIFIED RADICAL Left 2002   oral chemotheraphy (tamoxifen then Armidex) no radiation, Dr.[name]  . TONSILLECTOMY  1946    Prior to Admission medications   Medication Sig Start Date End Date Taking? Authorizing Provider  acetaminophen (TYLENOL) 500 MG tablet Take 2 tablets (1,000 mg total) by mouth 2 (two) times  daily as needed. 01/21/18   [name], MD  Albuterol Sulfate ([name]) 785 (90 Base) MCG/ACT AEPB Inhale 2 puffs into the lungs every 6 (six) hours as needed (dyspnea, wheezing).    [provider]  aspirin 81 MG tablet Take 81 mg by mouth daily.      [provider]  carbidopa-levodopa ([name]) 25-100 MG tablet TAKE 2 TABLETS IN THE MORNING, 1 TABLET IN THE AFTERNOON, 1 IN  THE EVENING 05/02/18   Tat, Eustace Quail, DO  clonazePAM (KLONOPIN) 0.5 MG tablet TAKE 1/2 TABLET BY MOUTH AT BEDTIME 06/26/18   Patel, Donika K, DO  fluticasone ([name]) 110 MCG/ACT inhaler Inhale 2 puffs into the lungs 2 (two) times daily.    [provider]  gabapentin ([name]) 300 MG capsule Take 2 capsules (600 mg total) by mouth 3 (three) times daily. 01/21/18   [name], MD  metoprolol tartrate (LOPRESSOR) 25 MG tablet Take 1 tablet (25 mg total) by mouth 2 (two) times daily. 01/21/18   [name], MD  predniSONE (DELTASONE) 20 MG tablet Take 2 tablets (40 mg total) by mouth daily for 4 days. 08/22/18 08/26/18  Arta Silence, MD  vitamin B-12 (CYANOCOBALAMIN) 1000 MCG tablet Take 1 tablet (1,000 mcg total) by mouth daily. 10/27/17   [name], MD  Vitamin D, Ergocalciferol, ([name]) 50000 units CAPS capsule TAKE 1 CAPSULE BY MOUTH EVERY WEEK 01/03/18   Megan Salon, MD    Allergies Sulfonamide derivatives; Topamax [topiramate]; Biaxin [clarithromycin]; Hydrocodone; Motrin [ibuprofen]; Symbicort [budesonide-formoterol fumarate]; Percocet [oxycodone-acetaminophen]; Tape; and Tetracyclines & related  Family History  Problem Relation Age of Onset  . Lung cancer Father 96       d.82 history of smoking  . Breast cancer Sister 57  . Colon cancer Sister   . Emphysema Mother        d.64 was never a smoker  . Stroke Paternal Aunt        in mid 8s  . Osteoporosis Paternal Aunt   . Melanoma Maternal Aunt   . Cancer Maternal Uncle        unspecified type  . Cancer Maternal Grandmother        d.82s unspecified GI cancer  . Cancer Maternal Grandfather        d.62s unspecified type  . Liver cancer Maternal Aunt   . Cancer Maternal Uncle        unspecified type  . Cancer Maternal Uncle        unspecified type  . Myasthenia gravis Paternal Aunt   . Breast cancer Cousin        d.60s-daughter of unaffected paternal aunt Eustace Moore  . Colon cancer Cousin 87        d.70-daughter of unaffected maternal aunt Leila  . Lung cancer Cousin 70       d.70-sisters to each other, both daughters of maternal uncle Johnny  . Diabetes Neg Hx   . Heart disease Neg Hx     Social History Social History   Tobacco Use  . Smoking status: Never Smoker  . Smokeless tobacco: Never Used  Substance Use Topics  . Alcohol use: No    Alcohol/week: 0.0 standard drinks  . Drug use: No    Review of Systems  Constitutional: No fever. Eyes: No redness. ENT: No sore throat. Cardiovascular: Denies chest pain. Respiratory: Positive for resolved shortness of breath. Gastrointestinal: No vomiting.  Genitourinary: Negative for flank pain.  Musculoskeletal: Negative for back pain. Skin: Negative  for rash. Neurological: Negative for headache.   ____________________________________________   PHYSICAL EXAM:  VITAL SIGNS: ED Triage Vitals  Enc Vitals Group     BP 08/21/18 1653 (!) 145/90     Pulse Rate 08/21/18 1653 76     Resp 08/21/18 1653 (!) 28     Temp 08/21/18 1653 97.6 F (36.4 C)     Temp Source 08/21/18 1653 Oral     SpO2 08/21/18 1653 96 %     Weight 08/21/18 1654 142 lb (64.4 kg)     Height 08/21/18 1654 5\' 2"  (1.575 m)     Head Circumference --      Peak Flow --      Pain Score 08/21/18 1654 0     Pain Loc --      Pain Edu? --      Excl. in [place]? --     Constitutional: Alert and oriented. Well appearing for age and in no acute distress. Eyes: Conjunctivae are normal.  Head: Atraumatic. Nose: No congestion/rhinnorhea. Mouth/Throat: Mucous membranes are moist.   Neck: Normal range of motion.  Cardiovascular: Normal rate, regular rhythm. Grossly normal heart sounds.  Good peripheral circulation. Respiratory: Normal respiratory effort.  No retractions. Lungs CTAB. Gastrointestinal: No distention.  Musculoskeletal: Extremities warm and well perfused.  Neurologic:  Normal speech and language. No gross focal neurologic deficits are appreciated.    Skin:  Skin is warm and dry. No rash noted. Psychiatric: Mood and affect are normal. Speech and behavior are normal.  ____________________________________________   LABS (all labs ordered are listed, but only abnormal results are displayed)  Labs Reviewed  BASIC METABOLIC PANEL - Abnormal; Notable for the following components:      Result Value   Glucose, Bld 103 (*)    All other components within normal limits  CBC - Abnormal; Notable for the following components:   RDW 14.6 (*)    All other components within normal limits  TROPONIN I   ____________________________________________  EKG  ED ECG REPORT I, Arta Silence, the attending physician, personally viewed and interpreted this ECG.  Date: 08/21/2018 EKG Time: 1647 Rate: 75 Rhythm: normal sinus rhythm QRS Axis: normal Intervals: normal ST/T Wave abnormalities: LVH with nonspecific ST abnormalities Narrative Interpretation: no evidence of acute ischemia; no significant change compared to EKG of 03/15/2016  ____________________________________________  RADIOLOGY  CXR: Nodular density in right midlung CT chest: Stable right lower lobe scarring.  Chronic stable bronchiectasis right lung.  Unchanged 4 cm aortic root aneurysm.  ____________________________________________   PROCEDURES  Procedure(s) performed: No  Procedures  Critical Care performed: No ____________________________________________   INITIAL IMPRESSION / ASSESSMENT AND PLAN / ED COURSE  Pertinent labs & imaging results that were available during my care of the patient were reviewed by me and considered in my medical decision making (see chart for details).  77 year old female with PMH as noted above presents with now resolved shortness of breath while at her allergist office today.  I reviewed the past medical records in [place] but was unable to access the note from the allergist office today.  On my initial exam (after the patient had  been in the ED for over 4 hours already) she appeared well, with normal vital signs except for hypertension and O2 saturation in the high 90s on room air.  Her lungs are clear.  The remainder of the exam is unremarkable.  Lab work-up is unremarkable.  Chest x-ray showed some opacity in the right lower lobe, and a  CT chest confirmed chronic findings of bronchiectasis and scarring as well as a known aortic root aneurysm.  At this time, given that the patient's symptoms have resolved and her vital signs and lab work-up are unremarkable, she is appropriate for discharge home.  She would like to go home at this time.  I suspect that she may be having a mild COPD exacerbation.  She received a nebulizer in the ED with good relief, and I will prescribe a 4-day course of prednisone.  Return precautions given, the patient expresses understanding.  Clinical Course as of Aug 21 2209  Thu Aug 21, 2018  2131 RBC: 4.14 [SS]    Clinical Course User Index [SS] Arta Silence, MD     ____________________________________________   FINAL CLINICAL IMPRESSION(S) / ED DIAGNOSES  Final diagnoses:  COPD exacerbation ([place])      NEW MEDICATIONS STARTED DURING THIS VISIT:  New Prescriptions   PREDNISONE (DELTASONE) 20 MG TABLET    Take 2 tablets (40 mg total) by mouth daily for 4 days.     Note:  This document was prepared using Dragon voice recognition software and may include unintentional dictation errors.    Arta Silence, MD 08/21/18 2210

## 2018-08-25 ENCOUNTER — Encounter: Payer: Self-pay | Admitting: Family Medicine

## 2018-08-25 ENCOUNTER — Ambulatory Visit: Payer: Medicare Other | Admitting: Family Medicine

## 2018-08-25 VITALS — BP 164/110 | HR 75 | Temp 97.6°F | Ht 62.5 in | Wt 142.0 lb

## 2018-08-25 DIAGNOSIS — I10 Essential (primary) hypertension: Secondary | ICD-10-CM | POA: Diagnosis not present

## 2018-08-25 DIAGNOSIS — Z23 Encounter for immunization: Secondary | ICD-10-CM

## 2018-08-25 DIAGNOSIS — I719 Aortic aneurysm of unspecified site, without rupture: Secondary | ICD-10-CM

## 2018-08-25 DIAGNOSIS — I951 Orthostatic hypotension: Secondary | ICD-10-CM | POA: Diagnosis not present

## 2018-08-25 DIAGNOSIS — G20 Parkinson's disease: Secondary | ICD-10-CM

## 2018-08-25 DIAGNOSIS — J449 Chronic obstructive pulmonary disease, unspecified: Secondary | ICD-10-CM

## 2018-08-25 NOTE — Progress Notes (Addendum)
BP (!) 164/110 (BP Location: Right Arm, Patient Position: Sitting, Cuff Size: Normal)   Pulse 75   Temp 97.6 F (36.4 C) (Oral)   Ht 5' 2.5" (1.588 m)   Wt 142 lb (64.4 kg)   SpO2 95%   BMI 25.56 kg/m   Orthostatic VS for the past 24 hrs (Last 3 readings):  BP- Lying BP- Standing at 0 minutes  08/25/18 1407 (!) 170/108 -  08/25/18 1403 - (!) 162/100   On my recheck: sitting 166/96 Standing 102/72  CC: ER f/u visit  Subjective:    Patient ID: Crystal Bass, female    DOB: 21-Jul-1941, 77 y.o.   MRN: [id number]  HPI: Crystal Bass is a 77 y.o. female presenting on 08/25/2018 for Hospitalization Follow-up (Here for ER visit f/u.)   Orthostatic hypotension in setting of parkinson's disease - at beginning of 07/2018 we stopped losartan 50mg  daily, continued metoprolol 25mg  bid. Ongoing orthostasis - sitting 979-892 systolic, standing 119-417 systolic. She finds she gets more shortwinded the longer she stands.   Recent ER visit last week for dyspnea - records reviewed. Found to have acute respiratory trouble while at her allergist's office. CT scan showed stable RLL scarring and chronic stable bronchiectasis of R lung as well as an unchanged 4cm aortic root aneurysm. Dx mild COPD exacerbation, treated with 4d course of prednisone.   Denies fever. Ongoing cough.   Relevant past medical, surgical, family and social history reviewed and updated as indicated. Interim medical history since our last visit reviewed. Allergies and medications reviewed and updated. Outpatient Medications Prior to Visit  Medication Sig Dispense Refill  . acetaminophen (TYLENOL) 500 MG tablet Take 2 tablets (1,000 mg total) by mouth 2 (two) times daily as needed.    . Albuterol Sulfate ([name]) 408 (90 Base) MCG/ACT AEPB Inhale 2 puffs into the lungs every 6 (six) hours as needed (dyspnea, wheezing).    [name] aspirin 81 MG tablet Take 81 mg by mouth daily.      . carbidopa-levodopa ([name])  25-100 MG tablet TAKE 2 TABLETS IN THE MORNING, 1 TABLET IN THE AFTERNOON, 1 IN THE EVENING 360 tablet 1  . clonazePAM (KLONOPIN) 0.5 MG tablet TAKE 1/2 TABLET BY MOUTH AT BEDTIME 45 tablet 0  . fluticasone ([name]) 110 MCG/ACT inhaler Inhale 2 puffs into the lungs 2 (two) times daily.    [name] gabapentin ([name]) 300 MG capsule Take 2 capsules (600 mg total) by mouth 3 (three) times daily. 540 capsule 3  . metoprolol tartrate (LOPRESSOR) 25 MG tablet Take 1 tablet (25 mg total) by mouth 2 (two) times daily. 180 tablet 3  . predniSONE (DELTASONE) 20 MG tablet Take 2 tablets (40 mg total) by mouth daily for 4 days. 8 tablet 0  . vitamin B-12 (CYANOCOBALAMIN) 1000 MCG tablet Take 1 tablet (1,000 mcg total) by mouth daily.    . Vitamin D, Ergocalciferol, ([name]) 50000 units CAPS capsule TAKE 1 CAPSULE BY MOUTH EVERY WEEK 12 capsule 3   No facility-administered medications prior to visit.      Per HPI unless specifically indicated in ROS section below Review of Systems     Objective:    BP (!) 164/110 (BP Location: Right Arm, Patient Position: Sitting, Cuff Size: Normal)   Pulse 75   Temp 97.6 F (36.4 C) (Oral)   Ht 5' 2.5" (1.588 m)   Wt 142 lb (64.4 kg)   SpO2 95%   BMI 25.56 kg/m   Wt  Readings from Last 3 Encounters:  08/25/18 142 lb (64.4 kg)  08/21/18 142 lb (64.4 kg)  07/17/18 144 lb (65.3 kg)    Physical Exam  Constitutional: She appears well-developed and well-nourished. No distress.  HENT:  Mouth/Throat: Oropharynx is clear and moist. No oropharyngeal exudate.  Eyes: Pupils are equal, round, and reactive to light. EOM are normal.  Cardiovascular: Normal rate, regular rhythm and normal heart sounds.  No murmur heard. Pulmonary/Chest: Effort normal. No respiratory distress. She has no wheezes. She has no rales.  Crackles R>L mid lung fields Coarse throughout  Musculoskeletal: She exhibits no edema.  Skin: Skin is warm and dry. No rash noted.  Nursing note and  vitals reviewed.  Results for orders placed or performed during the hospital encounter of 51/76/16  Basic metabolic panel  Result Value Ref Range   Sodium 139 135 - 145 mmol/L   Potassium 3.6 3.5 - 5.1 mmol/L   Chloride 104 98 - 111 mmol/L   CO2 26 22 - 32 mmol/L   Glucose, Bld 103 (H) 70 - 99 mg/dL   BUN 20 8 - 23 mg/dL   Creatinine, Ser 0.88 0.44 - 1.00 mg/dL   Calcium 9.5 8.9 - 10.3 mg/dL   GFR calc non Af Amer >60 >60 mL/min   GFR calc Af Amer >60 >60 mL/min   Anion gap 9 5 - 15  CBC  Result Value Ref Range   WBC 6.4 3.6 - 11.0 K/uL   RBC 4.14 3.80 - 5.20 MIL/uL   Hemoglobin 13.4 12.0 - 16.0 g/dL   HCT 39.1 35.0 - 47.0 %   MCV 94.5 80.0 - 100.0 fL   MCH 32.4 26.0 - 34.0 pg   MCHC 34.2 32.0 - 36.0 g/dL   RDW 14.6 (H) 11.5 - 14.5 %   Platelets 188 150 - 440 K/uL  Troponin I  Result Value Ref Range   Troponin I <0.03 <0.03 ng/mL      Assessment & Plan:   Problem List Items Addressed This Visit    Parkinson disease ([place])   Relevant Orders   Ambulatory referral to Cardiology   Orthostatic hypotension - Primary    Significant ongoing orthostasis with marked systolic changes upon standing - hesitant to add antihypertensive for this reason. However, concern for ongoing hypertension - see below. Will refer to cardiology for recommendations.       Relevant Orders   Ambulatory referral to Cardiology   Essential hypertension    Chronic, deteriorated off losartan. However, hypertension management limited by orthostasis. Want to attain better BP control in setting of know aortic root dilatation, but difficult situation - will refer to cardiology for recommendations. Last seen ~102yrs ago with essentially normal cardiac catheterization [name]).       Relevant Orders   Ambulatory referral to Cardiology   Chronic obstructive airway disease with asthma ([place])    Completing prednisone course by ER for mild exacerbation.  Ho indolent [name]. Latest CT showing bronchiectasis and  chronic scarring. Upcoming pulm appt for worsening chronic dyspnea (last seen 2015).       Aortic aneurysm (HCC)    Stable 4cm by latest chest CT - want to attain better BP control, limited by orthostasis.        Other Visit Diagnoses    Need for influenza vaccination       Relevant Orders   Flu Vaccine QUAD 36+ mos IM (Completed)       No orders of the defined types were placed  in this encounter.  Orders Placed This Encounter  Procedures  . Flu Vaccine QUAD 36+ mos IM  . Ambulatory referral to Cardiology    Referral Priority:   Routine    Referral Type:   Consultation    Referral Reason:   Specialty Services Required    Requested Specialty:   Cardiology    Number of Visits Requested:   1    Follow up plan: Return in about 3 months (around 11/24/2018) for follow up visit.  [name], MD

## 2018-08-25 NOTE — Assessment & Plan Note (Addendum)
Completing prednisone course by ER for mild exacerbation.  Ho indolent [name]. Latest CT showing bronchiectasis and chronic scarring. Upcoming pulm appt for worsening chronic dyspnea (last seen 2015).

## 2018-08-25 NOTE — Patient Instructions (Addendum)
Flu shot today Finish prednisone course. Continue metoprolol 25mg  twice daily.  We are in a rough spot regarding blood pressures - I recommend we refer you to cardiology for evaluation.

## 2018-08-25 NOTE — Assessment & Plan Note (Signed)
Stable 4cm by latest chest CT - want to attain better BP control, limited by orthostasis.

## 2018-08-25 NOTE — Assessment & Plan Note (Addendum)
Significant ongoing orthostasis with marked systolic changes upon standing - hesitant to add antihypertensive for this reason. However, concern for ongoing hypertension - see below. Will refer to cardiology for recommendations.

## 2018-08-25 NOTE — Assessment & Plan Note (Signed)
Chronic, deteriorated off losartan. However, hypertension management limited by orthostasis. Want to attain better BP control in setting of know aortic root dilatation, but difficult situation - will refer to cardiology for recommendations. Last seen ~69yrs ago with essentially normal cardiac catheterization Crystal Bass).

## 2018-09-04 ENCOUNTER — Ambulatory Visit (INDEPENDENT_AMBULATORY_CARE_PROVIDER_SITE_OTHER): Payer: Medicare Other | Admitting: Internal Medicine

## 2018-09-04 ENCOUNTER — Other Ambulatory Visit: Payer: Medicare Other

## 2018-09-04 ENCOUNTER — Encounter: Payer: Self-pay | Admitting: Internal Medicine

## 2018-09-04 VITALS — BP 140/82 | HR 68 | Ht 62.5 in | Wt 142.0 lb

## 2018-09-04 DIAGNOSIS — Z8709 Personal history of other diseases of the respiratory system: Secondary | ICD-10-CM

## 2018-09-04 DIAGNOSIS — J449 Chronic obstructive pulmonary disease, unspecified: Secondary | ICD-10-CM

## 2018-09-04 NOTE — Addendum Note (Signed)
Addended by: Joella Prince on: 09/04/2018 05:26 PM   Modules accepted: Orders

## 2018-09-04 NOTE — Progress Notes (Signed)
77 yowf never smoker with h/o [name] referred to Pulmonary clinic by Dr Linna Darner for chronic cough. CXR 4/13 COPD/emphysema  Stable postoperative changes involving the right lung. CT chest 03/12/11 post op changes on R, scattered perbronch nodules and mild bronchiectasis  03/13/2011 Initial pulmonary office eval in EMR era main c/o chronic cough (x years) and sob and worse x 3 weeks abruptly sob  Assoc with cough with green mucus which is a lot better p rx with augmentin  but breathing not better to her satisfaction since starting topamax with variable doe and resting sob and  having trouble talking.  No overt hb. No better on advair.  > rec stop advair, start high dose ppi/ pepcid hs and diet  03/28/2011 ov / Wert some better cough  But still sob with housework and walk at [employer], does not use [facility] parking. rec Continue Prilosec 20 mg Take 30-60 min before first meal of the day and Pepcid 20 mg one at bedtime  GERD (REFLUX) diet  04/17/2011 ov/Wert  Cc doe x years walking x 30 min x moderate pace. Cough resolved "because I stopped the topamax" Sleeping ok without nocturnal  or early am exac of resp c/o's. >>d/c ppi   08/15/2012 Acute OV  Complains of prod cough with yellow mucus, increased SOB x2 weeks.  Was given augmentin by PCP yesterday. Has not started this yet .  Not seen for > 1 year , doing okay w/out flares   no otc used.  No fever or hemoptysis . No chest pain  No recent travel or abx uses  >Take Augmentin as directed  Mucinex DM Twice daily As needed Cough/congestion  Fluids and rest  follow up 4 weeks and As needed  I will call with xray results.  Please contact office for sooner follow up if symptoms do not improve or worsen or seek emergency care   OV 09/17/2012  - switched to Dr MR Dyspnea: insidious onset. Progressive/Stable since onset several years ago. Rates dyspnea as moderate. Activities such as walking < few hundred feet or < 1/4 mile makes her dyspneic. Humidity,  pollens makes dyspnea worse.  Improved by sitting and resting. No associated chest pains. She feels she is deconditioned and is in a catch 22 situation.    Cough: is independent of dyspnea. Cough present for several years. Constant (not on and off). Worsened by infectious bronchitis episodes. Never had remission beyond 4 weeks with cough. Cough is moderate in intensity at baseline and with infectious episodes can get severe. She finds this embarrassing esp because she has to visit her ailing m-i-l in a nursing home. Quality is wet; brings out pea-green sputum during bronchitis but currently jelly like yellow which is baseline. OF note, she took 2 x 6-9 months of [name] Rx in 2002 and 2003 but she is not sure if that helped cough.    - Sinus: denies problems other than anosmia. Denies chronic rhino sinusitis symptoms Never seen ENT  - Allergies - sees Dr Donneta Romberg and has weekly allergy shots against mildew, [place] grass[place] x 40 years. Symptoms are well controled but are typically sneezing, runny nose, eyes. Says if she does not take allergy shots - allergies return with a "vengeance"   - GERD: denies. Per hx prior trial of ppi did not help  - BP: takes losartan and lopressor  - Pulmonary - never smoker. Born with asthma. Thinks she has copd now. Does not take any mdi because she felt  she could manage without it. However, Dr Donneta Romberg has recommended mdi and she believes she is close to making a decision on this.  PFTs may 2012: Mild obstruction with non-significant BD response. Normal DLCO. Carries prior diagnosis of bronchiectasis and mom died of it. .Denies diagnostic workup for bronchiectasis and does not want workup. CT oct 2013: Areas of bronchiectasis and scarring in the lingula and anterior right middle lobe. Pulmonary nodule: RLL Oct 2013: stable since oct 2012  10/01/2012 Acute OV  Complains of increased SOB, wheezing, prod cough with cream/gray/pea-green mucus, fever up to 100 x6 days - reports was  cleaning mildew outside the day before.  was given levaquin and pred taper by MR 11/4 via phone msg No hemoptyiss , chest pain or edema.  Mistakenly took dog's allergy med this am. Poison control and PCP/Vet said she should be fine.  No otc used .    REC CMA/RN will walk you for oxygen levels right now  Let us try pulmicort 118mcg, 2puff twice daily - take few samples and learn technique. Will send in script as well  REturn in 4-6 weeks with pft testing  Cough score at followup  OV 10/21/2012   Fu dyspnea and cough after starting pulmicort. She feels both have improved around 75%. "Yucky" yellow sputum is resolved. She is no longer embarrassed about visiting her mother in law in SNF. Current RSI cough score is 9 and reflects improvement post pulmicort; we do not have baseline value. Kouffman cough differentiator score is 0 for GERD and 1 for airway/neurogenic. PFT 10/21/2012 below: shows miild-moderate obstriuction with sig BD responmse and normal DLCO (like an asthmatic). She is hesitant to continue ICS due to risk of accelerated bone loss but after long discussion agreed. She agrees to add LABA Rx and therefore will  switch from pulmicort to symbicort       REC You have asthma or your bronchiectasis making you behave like an asthmatic PFT still shows obstruction at moderate severity despite pulmicort So stop pulmicort Please start symbicort 80/4.5 2 puff twice daily - take sample, script and show technique Use albuterol as needed Have pneumovax 10/21/2012 Glad you had flu shot Return in 3 months or sooner if needed   OV 02/12/2014  Chief Complaint  Patient presents with  . Asthma    pt states she has SOB with activity, and an occasional produtive cough with green phlegm.     Follow dyspnea and cough in the setting of obstructive lung disease related to asthma/bronchiectasis  I'm not seen in nearly a year and a half. She continues to do well. No interim problems. Cough and  dyspnea minimal. No interim hospitalizations, urgent care visits, emergency room visits or new medical diagnoses. No changes in her social situation other than the fact that her mother-in-law passed away. She wants to cut down her inhale corticosteroid use because of ongoing osteoporosis and concerns of that getting worse. She is interested in Prevnar      Subjective:    Patient ID: Crystal Bass, female    DOB: 1941/05/28, 77 y.o.   MRN: [id number]  PCP [name], MD  HPI    IOV 09/04/2018  Chief Complaint  Patient presents with  . Consult    Returning due to SOB increasing.      Marna Weniger Chanda 77 y.o. female  -last seen in 2015.  It is been over 3 years and is actually for years.  Therefore it is a new visit.  She tells me that she has had worsening shortness of breath in the last 1 year particularly since November 2018.  Also increase in cough but mostly shortness of breath worse with exertion and relieved by rest.   Symptm severity is listed blow - CAT score 32 and high symptomb uren The major changes to her life is that she was taking care of her mother-in-law with Alzheimer's and then she passed away.  She herself has been diagnosed with Parkinson's with only tremors.  Well-controlled with carbidopa.  She is now living in an apartment kind of situation in [place].  There is no cancer diagnosis or recurrence of prior cancers.  She continues with Flovent.  There is no chest pain orthopnea proximal nocturnal dyspnea.  She ended up in the ER late September 2019 and was diagnosed as "COPD exacerbation".  And was given 4 days prednisone and then released.  She says it only made a somewhat better.  Review of the chart indicates moderate obstructive lung function deficit with bronchodilator response and a normal DLCO but this was in 2013.  Blood labs on August 21 2018 shows creatinine 0.88 mg and hemoglobin 13.4 g%.  Noted she is on a beta 1 specific beta  blocker called Lopressor.    CAT COPD Symptom & Quality of Life Score (GSK trademark) 0 is no burden. 5 is highest burden 09/04/2018   Never Cough -> Cough all the time 4  No phlegm in chest -> Chest is full of phlegm 3  No chest tightness -> Chest feels very tight 4  No dyspnea for 1 flight stairs/hill -> Very dyspneic for 1 flight of stairs 5  No limitations for ADL at home -> Very limited with ADL at home 5  Confident leaving home -> Not at all confident leaving home 4  Sleep soundly -> Do not sleep soundly because of lung condition 5  Lots of Energy -> No energy at all 5  TOTAL Score (max 40)  32       PFTs 10/21/2012   PFT FVC fev1 ratio BD fev1 TLC DLCO comments  10/21/2012  post pulmicort 2.L/78% 1.35L/69% 64/down% 12% improvement in FEv1 to 1.5L 3.9L/85% 14.8/82% Mild Obstruction with posiitive BD response; Normal DLco - c/w asthmatic response    has a past medical history of Aneurysm of aorta ([facility]), Aortic root aneurysm ([place]), Breast cancer ([place]) (2002), Cataract (2019), COPD (chronic obstructive pulmonary disease) ([place]) (9/08), Ductal carcinoma of breast, estrogen receptor positive, stage 1 ([place]) (09/16/2012), Endometriosis, Hypercalcemia (09/14/2013), Hypertension, Lesion of breast (1992), Lung disease, Mastalgia (7/95), Osteoporosis, post-menopausal (03/14/2012), Ovarian cancer ([place]) (2004), Pancreatitis, Parkinson disease ([place]) (02/10/15), Post-thoracotomy pain syndrome, Pseudomonas pneumonia ([place]) (5/09), Uterine fibroid, and Vitamin D deficiency.   reports that she has never smoked. She has never used smokeless tobacco.  Past Surgical History:  Procedure Laterality Date  . 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 APPENDECTOMY  2004  . BREAST BIOPSY  08/2004   right breast-benign  . BREAST LUMPECTOMY  1992   benign  . CARDIAC CATHETERIZATION  2007   essentially negation for significant CAD  . CHOLECYSTECTOMY  2001  .  COLONOSCOPY  2014   Dr [name] , due 2019  . LUNG BIOPSY  2002   [name], Dr [name]  . MASTECTOMY MODIFIED RADICAL Left 2002   oral chemotheraphy (tamoxifen then Armidex) no radiation, Dr.[name]  . TONSILLECTOMY  1946  Allergies  Allergen Reactions  . Sulfonamide Derivatives Anaphylaxis  . Topamax [Topiramate] Other (See Comments)    Metabolic acidosis   . Biaxin [Clarithromycin] Other (See Comments)    pericarditis  . Hydrocodone Other (See Comments)    "hyper and climbing the walls"  . Motrin [Ibuprofen] Other (See Comments)    headaches  . Symbicort [Budesonide-Formoterol Fumarate]     02/07/15 tremor  . Percocet [Oxycodone-Acetaminophen] Itching and Rash  . Tape Itching and Rash    Use paper tape only  . Tetracyclines & Related Other (See Comments)    "immediate yeast infection"    Immunization History  Administered Date(s) Administered  . Influenza Split 09/11/2011  . Influenza Whole 09/09/2007, 08/08/2008, 08/15/2010, 08/26/2012  . Influenza,inj,Quad PF,6+ Mos 08/18/2013, 08/20/2014, 08/25/2018  . Influenza-Unspecified 09/27/2015, 08/26/2017  . Pneumococcal Conjugate-13 02/12/2014  . Pneumococcal Polysaccharide-23 11/26/2006, 10/21/2012  . Td 01/01/2011  . Zoster 05/26/2007  . Zoster Recombinat (Shingrix) 02/22/2018    Family History  Problem Relation Age of Onset  . Lung cancer Father 67       d.82 history of smoking  . Breast cancer Sister 30  . Colon cancer Sister   . Emphysema Mother        d.64 was never a smoker  . Stroke Paternal Aunt        in mid 12s  . Osteoporosis Paternal Aunt   . Melanoma Maternal Aunt   . Cancer Maternal Uncle        unspecified type  . Cancer Maternal Grandmother        d.82s unspecified GI cancer  . Cancer Maternal Grandfather        d.62s unspecified type  . Liver cancer Maternal Aunt   . Cancer Maternal Uncle        unspecified type  . Cancer Maternal Uncle        unspecified type  . Myasthenia gravis Paternal  Aunt   . Breast cancer Cousin        d.60s-daughter of unaffected paternal aunt Eustace Moore  . Colon cancer Cousin 6       d.70-daughter of unaffected maternal aunt Leila  . Lung cancer Cousin 70       d.70-sisters to each other, both daughters of maternal uncle Johnny  . Diabetes Neg Hx   . Heart disease Neg Hx      Current Outpatient Medications:  .  acetaminophen (TYLENOL) 500 MG tablet, Take 2 tablets (1,000 mg total) by mouth 2 (two) times daily as needed., Disp: , Rfl:  .  Albuterol Sulfate ([name]) 301 (90 Base) MCG/ACT AEPB, Inhale 2 puffs into the lungs every 6 (six) hours as needed (dyspnea, wheezing)., Disp: , Rfl:  .  aspirin 81 MG tablet, Take 81 mg by mouth daily.  , Disp: , Rfl:  .  carbidopa-levodopa ([name]) 25-100 MG tablet, TAKE 2 TABLETS IN THE MORNING, 1 TABLET IN THE AFTERNOON, 1 IN THE EVENING, Disp: 360 tablet, Rfl: 1 .  clonazePAM (KLONOPIN) 0.5 MG tablet, TAKE 1/2 TABLET BY MOUTH AT BEDTIME, Disp: 45 tablet, Rfl: 0 .  fluticasone ([name]) 110 MCG/ACT inhaler, Inhale 2 puffs into the lungs 2 (two) times daily., Disp: , Rfl:  .  gabapentin ([name]) 300 MG capsule, Take 2 capsules (600 mg total) by mouth 3 (three) times daily., Disp: 540 capsule, Rfl: 3 .  metoprolol tartrate (LOPRESSOR) 25 MG tablet, Take 1 tablet (25 mg total) by mouth 2 (two) times daily., Disp: 180 tablet,  Rfl: 3 .  Vitamin D, Ergocalciferol, ([name]) 50000 units CAPS capsule, TAKE 1 CAPSULE BY MOUTH EVERY WEEK, Disp: 12 capsule, Rfl: 3 .  vitamin B-12 (CYANOCOBALAMIN) 1000 MCG tablet, Take 1 tablet (1,000 mcg total) by mouth daily. (Patient not taking: Reported on 09/04/2018), Disp: , Rfl:    Review of Systems  Constitutional: Negative for fever and unexpected weight change.  HENT: Positive for congestion. Negative for dental problem, ear pain, nosebleeds, postnasal drip, sinus pressure, sneezing, sore throat and trouble swallowing.   Eyes: Negative for redness and itching.    Respiratory: Positive for cough, chest tightness, shortness of breath and wheezing.   Cardiovascular: Positive for palpitations. Negative for leg swelling.  Gastrointestinal: Negative for nausea and vomiting.  Genitourinary: Negative for dysuria.  Musculoskeletal: Negative for joint swelling.  Skin: Negative for rash.  Allergic/Immunologic: Positive for environmental allergies. Negative for food allergies and immunocompromised state.  Neurological: Positive for headaches.  Hematological: Bruises/bleeds easily.  Psychiatric/Behavioral: Negative for dysphoric mood. The patient is not nervous/anxious.        Objective:   Physical Exam  Vitals:   09/04/18 1645  BP: 140/82  Pulse: 68  SpO2: 96%  Weight: 142 lb (64.4 kg)  Height: 5' 2.5" (1.588 m)    Estimated body mass index is 25.56 kg/m as calculated from the following:   Height as of this encounter: 5' 2.5" (1.588 m).   Weight as of this encounter: 142 lb (64.4 kg).   General Appearance:    Alert, cooperative, no distress, appears stated age - yes , sitting on - chair  Head:    Normocephalic, without obvious abnormality, atraumatic  Eyes:    PERRL, conjunctiva/corneas clear,  Ears:    Normal TM's and external ear canals, both ears  Nose:   Nares normal, septum midline, mucosa normal, no drainage    or sinus tenderness. OXYGEN ON no @ ra  Throat:   Lips, mucosa, and tongue normal; teeth and gums normal. Cyanosis on lips - no  Neck:   Supple, symmetrical, trachea midline, no adenopathy;    thyroid:  no enlargement/tenderness/nodules; no carotid   bruit or JVD  Back:     Symmetric, no curvature, ROM normal, no CVA tenderness  Lungs:     Distress - no , Wheeze no, Barrell Chest - no, Purse lip breathing - no, Crackles - no   Chest Wall:    No tenderness or deformity. Scars in chest no   Heart:    Regular rate and rhythm, S1 and S2 normal, no murmur, rub   or gallop  Breast Exam:    NOT DONE  Abdomen:     Soft, non-tender,  bowel sounds active all four quadrants,    no masses, no organomegaly  Genitalia:   NOT DONE  Rectal:   NOT DONE  Extremities:   Extremities normal, atraumatic, Clubbing - no, Edema - no  Pulses:   2+ and symmetric all extremities  Skin:   Stigmata of Connective Tissue Disease - no  Lymph nodes:   Cervical, supraclavicular, and axillary nodes normal  Psychiatric:  Neurologic:   pleasant CNII-XII intact, normal strength, sensation  throughout            Assessment & Plan:     ICD-10-CM   1. Chronic obstructive pulmonary disease, unspecified COPD type ([place]) J44.9 Alpha-1 antitrypsin phenotype    Pulmonary function test  2. History of asthma Z87.09     Patient Instructions     ICD-10-CM  1. Chronic obstructive pulmonary disease, unspecified COPD type ([place]) J44.9 Alpha-1 antitrypsin phenotype    Pulmonary function test  2. History of asthma Z87.09    Symptom worsening might be because you are undertreated this is because the CT September 2019 scan of the chest is unchanged from several years ago v progressive airway diesase  Plan  -Check exhaled nitric oxide test today in the office -Check blood alpha-1 antitrypsin, CBC with differential and IgE blood work today  -If abnormal new generation biologic therapy could be an option -Continue Flovent as before -Add and start Spiriva Respimat low-dose daily -Do full pulmonary function test in the next few weeks -Continue with allergy shots through Dr. Donneta Romberg  Follow-up =-3-4 weeks from now return to see a nurse practitioner  -If test indicated obstructive lung disease might have to consider moving to bisoprolol over Lopressor -3-4 months from now return to see me Dr. Gaye Alken    Dr. Brand Males, M.D., F.C.C.P,  Pulmonary and Critical Care Medicine Staff Physician, [place] Director - Interstitial Lung Disease  Program  Pulmonary [place] at  [place], [place], [address]  Pager: [phone], If no answer or between  15:00h - 7:00h: call [phone] Telephone: [phone]  5:23 PM 09/04/2018

## 2018-09-04 NOTE — Patient Instructions (Addendum)
ICD-10-CM   1. Chronic obstructive pulmonary disease, unspecified COPD type ([place]) J44.9 Alpha-1 antitrypsin phenotype    Pulmonary function test  2. History of asthma Z87.09    Symptom worsening might be because you are undertreated this is because the CT September 2019 scan of the chest is unchanged from several years ago v progressive airway diesase  Plan  -Check exhaled nitric oxide test today in the office -Check blood alpha-1 antitrypsin, CBC with differential and IgE blood work today  -If abnormal new generation biologic therapy could be an option -Continue Flovent as before -Add and start Spiriva Respimat low-dose daily -Do full pulmonary function test in the next few weeks -Continue with allergy shots through Dr. Donneta Romberg  Follow-up =-3-4 weeks from now return to see a nurse practitioner  -If test indicated obstructive lung disease might have to consider moving to bisoprolol over Lopressor -3-4 months from now return to see me Dr. Chase Caller

## 2018-09-05 LAB — NITRIC OXIDE: Nitric Oxide: 34

## 2018-09-05 MED ORDER — TIOTROPIUM BROMIDE MONOHYDRATE 1.25 MCG/ACT IN AERS: 2.0000 | INHALATION_SPRAY | Freq: Every day | RESPIRATORY_TRACT | 0 refills | Status: DC

## 2018-09-05 NOTE — Addendum Note (Signed)
Addended by: Madolyn Frieze on: 09/05/2018 08:14 AM   Modules accepted: Orders

## 2018-09-05 NOTE — Addendum Note (Signed)
Addended by: Joella Prince on: 09/05/2018 10:42 AM   Modules accepted: Orders

## 2018-09-05 NOTE — Addendum Note (Signed)
Addended by: Madolyn Frieze on: 09/05/2018 08:10 AM   Modules accepted: Orders

## 2018-09-05 NOTE — Addendum Note (Signed)
Addended by: Madolyn Frieze on: 09/05/2018 08:19 AM   Modules accepted: Orders

## 2018-09-10 LAB — ALPHA-1 ANTITRYPSIN PHENOTYPE: A1 ANTITRYPSIN SER: 144 mg/dL (ref 83–199)

## 2018-09-10 LAB — IGE: IGE (IMMUNOGLOBULIN E), SERUM: 85 kU/L (ref <=114)

## 2018-09-10 NOTE — Telephone Encounter (Signed)
Patient is scheduled with Dr [name] with [facility] on 09/15/18. Will close encounter

## 2018-09-15 ENCOUNTER — Other Ambulatory Visit: Payer: Self-pay | Admitting: Neurology

## 2018-09-15 ENCOUNTER — Ambulatory Visit: Payer: Medicare Other | Admitting: Internal Medicine

## 2018-09-15 ENCOUNTER — Encounter: Payer: Self-pay | Admitting: Internal Medicine

## 2018-09-15 VITALS — BP 98/70 | HR 99 | Ht 64.0 in | Wt 139.2 lb

## 2018-09-15 DIAGNOSIS — R4702 Dysphasia: Secondary | ICD-10-CM

## 2018-09-15 DIAGNOSIS — R935 Abnormal findings on diagnostic imaging of other abdominal regions, including retroperitoneum: Secondary | ICD-10-CM | POA: Diagnosis not present

## 2018-09-15 DIAGNOSIS — R194 Change in bowel habit: Secondary | ICD-10-CM

## 2018-09-15 DIAGNOSIS — R1011 Right upper quadrant pain: Secondary | ICD-10-CM

## 2018-09-15 DIAGNOSIS — Z800 Family history of malignant neoplasm of digestive organs: Secondary | ICD-10-CM

## 2018-09-15 MED ORDER — NA SULFATE-K SULFATE-MG SULF 17.5-3.13-1.6 GM/177ML PO SOLN: 1.0000 | Freq: Once | ORAL | 0 refills | Status: AC

## 2018-09-15 NOTE — Progress Notes (Signed)
HISTORY OF PRESENT ILLNESS:  Crystal Bass is a 77 y.o. female with past medical history as listed below who was sent today by her primary care provider regarding problems with right upper quadrant pain, abnormal imaging, change in bowel habits, and the need for follow-up screening colonoscopy.  The patient has been seen previously for screening colonoscopy secondary to a family history of colon cancer in her sister around age 10.  The patient had negative examinations in 2007 and again in 2014.  Follow-up in 5 years based on her family history recommended.  She has received a recall notification.  She does tell me that several months ago she was having a change in her bowel habits from more regular bowel movements to diarrhea with urgency.  She attributed this to stress.  Problem has improved.  Currently she reports formed bowel movements which are subsequently occasionally followed by more looser stools.  No incontinence.  No nocturnal symptoms.  Next, she was having issues with right upper quadrant pain back in March 2019.  She is status post cholecystectomy.  Review of outside radiology includes an abdominal ultrasound from January 31, 2018.  No relevant intra-abdominal abnormalities postcholecystectomy.  Incidental hepatic cyst and kidney cyst noted.  She subsequently underwent a CT scan of the abdomen and pelvis with contrast February 19, 2018.  It was felt to have a vascular malformation of the liver.  No acute abnormalities.  However, a question of thickening of the gastric fundus, either real or artifact, was noted for which upper endoscopy has been recommended.  Since that time the patient tells me that her right-sided discomfort has improved.  She denies nausea or vomiting.  Her last office evaluation was May 2016.  At that time she reported intermittent solid food dysphasia.  She had been scheduled for upper endoscopy on 2 separate occasions but canceled.  Review of outside blood work from August 21, 2018 finds unremarkable basic metabolic panel.  Unremarkable CBC with hemoglobin 13.4.  Her other chronic medical problems are stable  REVIEW OF SYSTEMS:  All non-GI ROS negative unless otherwise stated in the HPI except for back pain  Past Medical History:  Diagnosis Date  . 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 Aortic root aneurysm ([place])   . Breast cancer ([place]) 2002   infiltrative ductal carcinoma   . Cataract 2019  . COPD (chronic obstructive pulmonary disease) ([place]) 9/08  . Ductal carcinoma of breast, estrogen receptor positive, stage 1 ([place]) 09/16/2012  . Endometriosis   . Hypercalcemia 09/14/2013  . Hypertension   . Lesion of breast 1992   right, benign  . Lung disease    secondary to [name] infection  . Mastalgia 7/95  . Osteoporosis, post-menopausal 03/14/2012  . Ovarian cancer ([place]) 2004  . Pancreatitis    secondary to Cholelithiasis  . Parkinson disease ([place]) 02/10/15  . Post-thoracotomy pain syndrome   . Pseudomonas pneumonia ([place]) 5/09  . Uterine fibroid   . Vitamin D deficiency     Past Surgical History:  Procedure Laterality Date  . 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 APPENDECTOMY  2004  . BREAST BIOPSY  08/2004   right breast-benign  . BREAST LUMPECTOMY  1992   benign  . CARDIAC CATHETERIZATION  2007   essentially negation for significant CAD  . CHOLECYSTECTOMY  2001  . COLONOSCOPY  2014  Dr [name] , due 2019  . LUNG BIOPSY  2002   [name], Dr [name]  . MASTECTOMY MODIFIED RADICAL Left 2002   oral chemotheraphy (tamoxifen then Armidex) no radiation, Dr.[name]  . TONSILLECTOMY  1946    Social History TRENA DUNAVAN  reports that she has never smoked. She has never used smokeless tobacco. She reports that she does not drink alcohol or use drugs.  family history includes Breast cancer in her cousin; Breast cancer (age of onset: 72) in her sister; Cancer in her maternal  grandfather, maternal grandmother, maternal uncle, maternal uncle, and maternal uncle; Colon cancer in her sister; Colon cancer (age of onset: 5) in her cousin; Emphysema in her mother; Liver cancer in her maternal aunt; Lung cancer (age of onset: 40) in her cousin; Lung cancer (age of onset: 63) in her father; Melanoma in her maternal aunt; Myasthenia gravis in her paternal aunt; Osteoporosis in her paternal aunt; Stroke in her paternal aunt.  Allergies  Allergen Reactions  . Sulfonamide Derivatives Anaphylaxis  . Topamax [Topiramate] Other (See Comments)    Metabolic acidosis   . Biaxin [Clarithromycin] Other (See Comments)    pericarditis  . Hydrocodone Other (See Comments)    "hyper and climbing the walls"  . Motrin [Ibuprofen] Other (See Comments)    headaches  . Symbicort [Budesonide-Formoterol Fumarate]     02/07/15 tremor  . Percocet [Oxycodone-Acetaminophen] Itching and Rash  . Tape Itching and Rash    Use paper tape only  . Tetracyclines & Related Other (See Comments)    "immediate yeast infection"       PHYSICAL EXAMINATION: Vital signs: BP 98/70   Pulse 99   Ht 5\' 4"  (1.626 m)   Wt 139 lb 4 oz (63.2 kg)   BMI 23.90 kg/m   Constitutional: generally well-appearing, no acute distress Psychiatric: alert and oriented x3, cooperative Eyes: extraocular movements intact, anicteric, conjunctiva pink Mouth: oral pharynx moist, no lesions Neck: supple no lymphadenopathy Cardiovascular: heart regular rate and rhythm, no murmur Lungs: clear to auscultation bilaterally Abdomen: soft, nontender, nondistended, no obvious ascites, no peritoneal signs, normal bowel sounds, no organomegaly.  Prior surgical incisions well-healed Rectal: Deferred until colonoscopy Extremities: no clubbing, cyanosis, or lower extremity edema bilaterally Skin: no lesions on visible extremities Neuro: No focal deficits.  Cranial nerves intact  ASSESSMENT:  1.  Previous problems with right upper  quadrant pain.  Improved 2.  Question gastric thickening on CT scan.  Needs clarified in a patient with a history of breast and ovarian cancer with associated mild elevation of CA125 3.  Mild intermittent solid food dysphagia.  Rule out stricture 4.  Family history of colon cancer in sister around 34.  Previous negative examinations 2007 and 2014.  Due for follow-up  5.  Change in bowel habits as described.  PLAN:  1.  Schedule upper endoscopy to evaluate CT abnormality and dysphasia.  As well prior history of right upper quadrant pain.The nature of the procedure, as well as the risks, benefits, and alternatives were carefully and thoroughly reviewed with the patient. Ample time for discussion and questions allowed. The patient understood, was satisfied, and agreed to proceed. 2.  Schedule follow-up screening colonoscopy. The nature of the procedure, as well as the risks, benefits, and alternatives were carefully and thoroughly reviewed with the patient. Ample time for discussion and questions allowed. The patient understood, was satisfied, and agreed to proceed. 3.  Advised to take fiber supplementation such as Metamucil daily to improve bowel  habit consistency 4.  Ongoing general medical care with PCP

## 2018-09-15 NOTE — Patient Instructions (Signed)

## 2018-09-16 ENCOUNTER — Encounter: Payer: Self-pay | Admitting: Cardiovascular Disease

## 2018-09-16 ENCOUNTER — Ambulatory Visit: Payer: Medicare Other | Admitting: Cardiovascular Disease

## 2018-09-16 VITALS — BP 120/80 | HR 60 | Ht 62.5 in | Wt 140.5 lb

## 2018-09-16 DIAGNOSIS — R0602 Shortness of breath: Secondary | ICD-10-CM

## 2018-09-16 DIAGNOSIS — I10 Essential (primary) hypertension: Secondary | ICD-10-CM | POA: Diagnosis not present

## 2018-09-16 DIAGNOSIS — I712 Thoracic aortic aneurysm, without rupture: Secondary | ICD-10-CM

## 2018-09-16 DIAGNOSIS — I7121 Aneurysm of the ascending aorta, without rupture: Secondary | ICD-10-CM

## 2018-09-16 DIAGNOSIS — G903 Multi-system degeneration of the autonomic nervous system: Secondary | ICD-10-CM | POA: Diagnosis not present

## 2018-09-16 NOTE — Patient Instructions (Addendum)
Medication Instructions:  No changes  If you need a refill on your cardiac medications before your next appointment, please call your pharmacy.   Lab work: None ordered.  Testing/Procedures: Your physician has requested that you have an echocardiogram. Echocardiography is a painless test that uses sound waves to create images of your heart. It provides your doctor with information about the size and shape of your heart and how well your heart's chambers and valves are working. You may receive an ultrasound enhancing agent through an IV if needed to better visualize your heart during the echo.This procedure takes approximately one hour. There are no restrictions for this procedure. This will take place at the [facility] clinic.    Follow-Up: At [facility], you and your health needs are our priority.  As part of our continuing mission to provide you with exceptional heart care, we have created designated Provider Care Teams.  These Care Teams include your primary Cardiologist (physician) and Advanced Practice Providers (APPs -  Physician Assistants and Nurse Practitioners) who all work together to provide you with the care you need, when you need it. You will need a follow up appointment in 3 months.  You may see Dr. Fletcher Anon or one of the following Advanced Practice Providers on your designated Care Team:   Murray Hodgkins, NP Christell Faith, PA-C . Marrianne Mood, PA-C  Any Other Special Instructions Will Be Listed Below (If Applicable). Please buy an abdominal binder to wear during the day. You may find one at your local pharmacy. If you have trouble getting one, please call the office.

## 2018-09-16 NOTE — Progress Notes (Signed)
Cardiology Office Note   Date:  09/16/2018   ID:  Crystal, Bass 1941-01-17, MRN [id number]  PCP:  [name], MD  Cardiologist:   [name], MD   Chief Complaint  Patient presents with  . other    Ref by Dr. Danise Bass for essential HTN and orthostatic Hypotension. Meds reviewed by the pt. verbally. Pt. c/o dizziness, chest pain at times and shortness of breath.       History of Present Illness: Crystal Bass is a 77 y.o. female who was referred by Dr. Danise Bass for evaluation of orthostatic hypotension.  The patient has known history of Parkinson's disease.  She was diagnosed with Parkinson's in 2015 but reports prolonged history of orthostatic dizziness. She used to be on multiple blood pressure medications.  Losartan was discontinued in September and metoprolol 25 mg twice daily was continued.  She continued to be orthostatic. She reports dyspnea with emergency room visit in September.  She has known history of COPD.  CT scan showed stable right lower lobe scarring and chronic bronchiectasis of the right lung.  She was treated with a course of prednisone.  The patient had prior cardiac catheterization in 2012 by Dr. [name] which showed normal coronary arteries and ejection fraction and normal renal arteries. There has been concern about decreasing her antihypertensive medications given the presence of an ascending aortic aneurysm at 4 cm.  Fortunately, this has been stable on serial imaging.  The patient reports moderate orthostatic dizziness with no recent syncope.  She also reports significant shortness of breath with no chest pain.  Past Medical History:  Diagnosis Date  . 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 Aortic root aneurysm ([place])   . Breast cancer ([place]) 2002   infiltrative ductal carcinoma   . Cataract 2019  . COPD (chronic obstructive pulmonary disease) ([name]) 9/08  . Ductal carcinoma of breast, estrogen receptor  positive, stage 1 ([name]) 09/16/2012  . Endometriosis   . Hypercalcemia 09/14/2013  . Hypertension   . Lesion of breast 1992   right, benign  . Lung disease    secondary to [name] infection  . Mastalgia 7/95  . Osteoporosis, post-menopausal 03/14/2012  . Ovarian cancer ([name]) 2004  . Pancreatitis    secondary to Cholelithiasis  . Parkinson disease ([name]) 02/10/15  . Post-thoracotomy pain syndrome   . Pseudomonas pneumonia ([name]) 5/09  . Uterine fibroid   . Vitamin D deficiency     Past Surgical History:  Procedure Laterality Date  . 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 APPENDECTOMY  2004  . BREAST BIOPSY  08/2004   right breast-benign  . BREAST LUMPECTOMY  1992   benign  . CARDIAC CATHETERIZATION  2007   essentially negation for significant CAD  . CHOLECYSTECTOMY  2001  . COLONOSCOPY  2014   Dr [name] , due 2019  . LUNG BIOPSY  2002   [name], Dr [name]  . MASTECTOMY MODIFIED RADICAL Left 2002   oral chemotheraphy (tamoxifen then Armidex) no radiation, Dr.[name]  . TONSILLECTOMY  1946     Current Outpatient Medications  Medication Sig Dispense Refill  . acetaminophen (TYLENOL) 500 MG tablet Take 2 tablets (1,000 mg total) by mouth 2 (two) times daily as needed.    . Albuterol Sulfate ([name]) 371 (90 Base) MCG/ACT AEPB Inhale 2 puffs into the lungs every 6 (six)  hours as needed (dyspnea, wheezing).    [name] aspirin 81 MG tablet Take 81 mg by mouth daily.      . carbidopa-levodopa ([name]) 25-100 MG tablet TAKE 2 TABLETS IN THE MORNING, 1 TABLET IN THE AFTERNOON, 1 IN THE EVENING 360 tablet 1  . clonazePAM (KLONOPIN) 0.5 MG tablet TAKE 1/2 TABLET BY MOUTH EVERY DAY AT BEDTIME 45 tablet 0  . fluticasone ([name]) 110 MCG/ACT inhaler Inhale 2 puffs into the lungs 2 (two) times daily.    [name] gabapentin ([name]) 300 MG capsule Take 2 capsules (600 mg total) by mouth 3 (three) times daily. 540 capsule 3  . metoprolol  tartrate (LOPRESSOR) 25 MG tablet Take 1 tablet (25 mg total) by mouth 2 (two) times daily. 180 tablet 3  . Tiotropium Bromide Monohydrate (SPIRIVA RESPIMAT) 1.25 MCG/ACT AERS Inhale 2 puffs into the lungs daily. 1 Inhaler 0  . Vitamin D, Ergocalciferol, ([name]) 50000 units CAPS capsule TAKE 1 CAPSULE BY MOUTH EVERY WEEK 12 capsule 3   No current facility-administered medications for this visit.     Allergies:   Sulfonamide derivatives; Topamax [topiramate]; Biaxin [clarithromycin]; Hydrocodone; Motrin [ibuprofen]; Symbicort [budesonide-formoterol fumarate]; Percocet [oxycodone-acetaminophen]; Tape; and Tetracyclines & related    Social History:  The patient  reports that she has never smoked. She has never used smokeless tobacco. She reports that she does not drink alcohol or use drugs.   Family History:  The patient's family history includes Breast cancer in her cousin; Breast cancer (age of onset: 52) in her sister; Cancer in her maternal grandfather, maternal grandmother, maternal uncle, maternal uncle, and maternal uncle; Colon cancer in her sister; Colon cancer (age of onset: 52) in her cousin; Emphysema in her mother; Liver cancer in her maternal aunt; Lung cancer (age of onset: 48) in her cousin; Lung cancer (age of onset: 70) in her father; Melanoma in her maternal aunt; Myasthenia gravis in her paternal aunt; Osteoporosis in her paternal aunt; Stroke in her paternal aunt.    ROS:  Please see the history of present illness.   Otherwise, review of systems are positive for none.   All other systems are reviewed and negative.    PHYSICAL EXAM: VS:  BP 120/80 (BP Location: Right Arm, Patient Position: Sitting, Cuff Size: Normal)   Pulse 60   Ht 5' 2.5" (1.588 m)   Wt 140 lb 8 oz (63.7 kg)   SpO2 96%   BMI 25.29 kg/m  , BMI Body mass index is 25.29 kg/m. GEN: Well nourished, well developed, in no acute distress  HEENT: normal  Neck: no JVD, carotid bruits, or masses Cardiac: RRR;  no murmurs, rubs, or gallops,no edema  Respiratory:  clear to auscultation bilaterally, normal work of breathing GI: soft, nontender, nondistended, + BS MS: no deformity or atrophy  Skin: warm and dry, no rash Neuro:  Strength and sensation are intact Psych: euthymic mood, full affect   EKG:  EKG is ordered today. The ekg ordered today demonstrates normal sinus rhythm with possible left atrial enlargement, LVH with repolarization abnormalities.   Recent Labs: 10/23/2017: TSH 1.52 08/21/2018: BUN 20; Creatinine, Ser 0.88; Hemoglobin 13.4; Platelets 188; Potassium 3.6; Sodium 139    Lipid Panel    Component Value Date/Time   CHOL 172 01/14/2018 1121   TRIG 161.0 (H) 01/14/2018 1121   HDL 44.90 01/14/2018 1121   CHOLHDL 4 01/14/2018 1121   VLDL 32.2 01/14/2018 1121   LDLCALC 95 01/14/2018 1121   LDLDIRECT 135.0 08/16/2009 1359  Wt Readings from Last 3 Encounters:  09/16/18 140 lb 8 oz (63.7 kg)  09/15/18 139 lb 4 oz (63.2 kg)  09/04/18 142 lb (64.4 kg)      No flowsheet data found.    ASSESSMENT AND PLAN:  1.  Neurogenic orthostatic hypotension in the setting of Parkinson's disease: Patient systolic blood pressure dropped from 125 mmHg to 99 mmHg.  The drop in her home blood pressure readings is even more impressive and frequently  has supine systolic blood pressure close to 170/180 mmHg.  Thus, this makes it difficult to place her on midodrine or Florinef.  I think we should start with lifestyle modifications and evaluate her response.  I advised her to increase her fluid intake and also start using an abdominal binder during the day. If no improvement in symptoms, we can consider Northera.  2.  Essential hypertension: Blood pressure today is controlled but her blood pressure has not been controlled at home or with recent visits.  Once her orthostasis improves, we can consider resuming losartan.  3.  Exertional dyspnea: Previous cardiac catheterization showed normal  coronary arteries.  I requested an echocardiogram.  4.  Small ascending aortic aneurysm at 4 cm: I doubt that this would progress to requiring surgical intervention during her lifetime.    Disposition:   FU with me in 3 months  Signed,  [name], MD  09/16/2018 4:15 PM    [facility]

## 2018-09-19 ENCOUNTER — Ambulatory Visit (INDEPENDENT_AMBULATORY_CARE_PROVIDER_SITE_OTHER): Payer: Medicare Other

## 2018-09-19 DIAGNOSIS — R0602 Shortness of breath: Secondary | ICD-10-CM | POA: Diagnosis not present

## 2018-09-24 ENCOUNTER — Telehealth: Payer: Self-pay | Admitting: Cardiovascular Disease

## 2018-09-24 NOTE — Telephone Encounter (Signed)
Patient calling to discuss recent ECHO testing results  ° °Please call  ° °

## 2018-09-24 NOTE — Telephone Encounter (Signed)
Informed patient of results; informed to call the office with any further questions or concerns.  

## 2018-09-26 ENCOUNTER — Telehealth: Payer: Self-pay | Admitting: *Deleted

## 2018-09-26 ENCOUNTER — Encounter: Payer: Self-pay | Admitting: Internal Medicine

## 2018-09-26 HISTORY — PX: COLONOSCOPY: SHX174

## 2018-09-26 HISTORY — PX: ESOPHAGOGASTRODUODENOSCOPY: SHX1529

## 2018-09-26 NOTE — Telephone Encounter (Signed)
-----   Message from Wellington Hampshire, MD sent at 09/25/2018 12:49 PM EDT ----- Inform patient that echo showed normal EF with moderate mitral valve prolapse with moderate regurgitation.  This will be monitored.  She should keep her follow-up appointment with me to discuss further.

## 2018-09-26 NOTE — Telephone Encounter (Signed)
Patient made aware of results and verbalized understanding.  

## 2018-10-01 ENCOUNTER — Encounter: Payer: Self-pay | Admitting: Primary Care

## 2018-10-01 ENCOUNTER — Ambulatory Visit: Payer: Medicare Other | Admitting: Primary Care

## 2018-10-01 ENCOUNTER — Other Ambulatory Visit (INDEPENDENT_AMBULATORY_CARE_PROVIDER_SITE_OTHER): Payer: Medicare Other

## 2018-10-01 DIAGNOSIS — J449 Chronic obstructive pulmonary disease, unspecified: Secondary | ICD-10-CM

## 2018-10-01 LAB — CBC WITH DIFFERENTIAL/PLATELET
BASOS ABS: 0.1 10*3/uL (ref 0.0–0.1)
Basophils Relative: 0.9 % (ref 0.0–3.0)
EOS ABS: 0.2 10*3/uL (ref 0.0–0.7)
Eosinophils Relative: 2 % (ref 0.0–5.0)
HCT: 40.5 % (ref 36.0–46.0)
Hemoglobin: 13.5 g/dL (ref 12.0–15.0)
Lymphocytes Relative: 11.8 % — ABNORMAL LOW (ref 12.0–46.0)
Lymphs Abs: 1 10*3/uL (ref 0.7–4.0)
MCHC: 33.4 g/dL (ref 30.0–36.0)
MCV: 94.5 fl (ref 78.0–100.0)
MONO ABS: 0.7 10*3/uL (ref 0.1–1.0)
Monocytes Relative: 7.6 % (ref 3.0–12.0)
NEUTROS PCT: 77.7 % — AB (ref 43.0–77.0)
Neutro Abs: 6.8 10*3/uL (ref 1.4–7.7)
Platelets: 172 10*3/uL (ref 150.0–400.0)
RBC: 4.28 Mil/uL (ref 3.87–5.11)
RDW: 14.5 % (ref 11.5–15.5)
WBC: 8.7 10*3/uL (ref 4.0–10.5)

## 2018-10-01 MED ORDER — PREDNISONE 10 MG PO TABS: ORAL_TABLET | ORAL | 0 refills | Status: DC

## 2018-10-01 NOTE — Patient Instructions (Addendum)
Continue Flovent Continue Spiriva until you run out   Needs to schedule pulmonary function test (if these show obstruction consider changing Lopressor to Bisoprolol)  IgE, FENO and eos absolute were elevated indicating an allergic component to asthma symptoms. I will call when I discuss further with Dr. Chase Caller (consider newer generation biologic such as Dupixent or Xolair)  Follow up in 2 months with Dr. Chase Caller

## 2018-10-01 NOTE — Progress Notes (Signed)
@Patient  ID: Crystal Bass, female    DOB: [birth year], 77 y.o.   MRN: [id number]  Chief Complaint  Patient presents with  . Follow-up    breathing about the same-SOB-cough with green pea size and color-chest tightness and wheezing    Referring provider: [name], MD  HPI: 77 year old female, never smoked. PMH COPD with asthma. Patient of Dr. Chase Caller, last seen on 09/04/18 (previously seen before that in 2015). Patient reported worsening shortness of breath in the last 1 year particularly since November 2018.  Also increase in cough but mostly shortness of breath worse with exertion and relieved by rest.   Ordered for FENO, labs including alpha-1 antitrypsin, CBC with diff and IgE. If abnormal new generation biologic therapy could be an option. Needs full PFTs, continue Flovent and add spiriva low dose. Follow up with allergy shots with Dr. Donneta Romberg. If test indicated obstructive lung disease might have to consider moving to bisoprolol over Lopressor.  Follow-up with NP in 1 month and Dr. Chase Caller in 3 months.    10/02/2018 Patient presents today for 1 month follow-up. Breathing is about the same, cough with mild production. Sob especially with activity. Associated chest tightness and wheezing. These symptoms have been going on for some time, worse over the last year. Hasn't noticed a difference with Spiriva. She has been on Flovent for awhile, recently feels it has been causing chest tightness and sob after taking medication but eases after some time. She is not interested in trying another ICS inhaler right now. Continues with allergy shots with Dr. Donneta Romberg. Consideration for adding newer generation biologic like Xolair or Dupixent. Needs to complete PFTs.     Significant testing: 09/04/18- FENO 35 09/04/18- IgE 85 10/01/2018 - Eos absolute 0.2  09/04/18 - Alpha-1 antitrypsin- 144 (within normal range) CXR showed chronic stable bilateraler bronchiectasis. Stable scar in RUL  unchanged since 2012  Allergies  Allergen Reactions  . Sulfonamide Derivatives Anaphylaxis  . Topamax [Topiramate] Other (See Comments)    Metabolic acidosis   . Biaxin [Clarithromycin] Other (See Comments)    pericarditis  . Hydrocodone Other (See Comments)    "hyper and climbing the walls"  . Motrin [Ibuprofen] Other (See Comments)    headaches  . Symbicort [Budesonide-Formoterol Fumarate]     02/07/15 tremor  . Percocet [Oxycodone-Acetaminophen] Itching and Rash  . Tape Itching and Rash    Use paper tape only  . Tetracyclines & Related Other (See Comments)    "immediate yeast infection"    Immunization History  Administered Date(s) Administered  . Influenza Split 09/11/2011  . Influenza Whole 09/09/2007, 08/08/2008, 08/15/2010, 08/26/2012  . Influenza,inj,Quad PF,6+ Mos 08/18/2013, 08/20/2014, 08/25/2018  . Influenza-Unspecified 09/27/2015, 08/26/2017  . Pneumococcal Conjugate-13 02/12/2014  . Pneumococcal Polysaccharide-23 11/26/2006, 10/21/2012  . Td 01/01/2011  . Zoster 05/26/2007  . Zoster Recombinat (Shingrix) 02/22/2018    Past Medical History:  Diagnosis Date  . 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 Aortic root aneurysm ([place])   . Breast cancer ([place]) 2002   infiltrative ductal carcinoma   . Cataract 2019  . COPD (chronic obstructive pulmonary disease) ([place]) 9/08  . Ductal carcinoma of breast, estrogen receptor positive, stage 1 ([place]) 09/16/2012  . Endometriosis   . Hypercalcemia 09/14/2013  . Hypertension   . Lesion of breast 1992   right, benign  . Lung disease    secondary to [name] infection  . Mastalgia 7/95  .  Osteoporosis, post-menopausal 03/14/2012  . Ovarian cancer ([place]) 2004  . Pancreatitis    secondary to Cholelithiasis  . Parkinson disease ([place]) 02/10/15  . Post-thoracotomy pain syndrome   . Pseudomonas pneumonia ([place]) 5/09  . Uterine fibroid   . Vitamin D deficiency     Tobacco History: Social History    Tobacco Use  Smoking Status Never Smoker  Smokeless Tobacco Never Used   Counseling given: Not Answered   Outpatient Medications Prior to Visit  Medication Sig Dispense Refill  . acetaminophen (TYLENOL) 500 MG tablet Take 2 tablets (1,000 mg total) by mouth 2 (two) times daily as needed.    . Albuterol Sulfate ([name]) 671 (90 Base) MCG/ACT AEPB Inhale 2 puffs into the lungs every 6 (six) hours as needed (dyspnea, wheezing).    [name] aspirin 81 MG tablet Take 81 mg by mouth daily.      . carbidopa-levodopa ([name]) 25-100 MG tablet TAKE 2 TABLETS IN THE MORNING, 1 TABLET IN THE AFTERNOON, 1 IN THE EVENING 360 tablet 1  . clonazePAM (KLONOPIN) 0.5 MG tablet TAKE 1/2 TABLET BY MOUTH EVERY DAY AT BEDTIME 45 tablet 0  . fluticasone ([name]) 110 MCG/ACT inhaler Inhale 2 puffs into the lungs 2 (two) times daily.    [name] gabapentin ([name]) 300 MG capsule Take 2 capsules (600 mg total) by mouth 3 (three) times daily. 540 capsule 3  . metoprolol tartrate (LOPRESSOR) 25 MG tablet Take 1 tablet (25 mg total) by mouth 2 (two) times daily. 180 tablet 3  . Tiotropium Bromide Monohydrate (SPIRIVA RESPIMAT) 1.25 MCG/ACT AERS Inhale 2 puffs into the lungs daily. 1 Inhaler 0  . Vitamin D, Ergocalciferol, ([name]) 50000 units CAPS capsule TAKE 1 CAPSULE BY MOUTH EVERY WEEK 12 capsule 3   No facility-administered medications prior to visit.     Review of Systems  Review of Systems  Constitutional: Negative.   HENT: Negative.   Respiratory: Positive for cough, shortness of breath and wheezing.   Cardiovascular: Negative.     Physical Exam  BP 122/88 (BP Location: Right Arm, Cuff Size: Normal)   Pulse 63   Temp 98 F (36.7 C)   Ht 5' 2.5" (1.588 m)   Wt 142 lb 3.2 oz (64.5 kg)   SpO2 98%   BMI 25.59 kg/m  Physical Exam  Constitutional: She is oriented to person, place, and time. She appears well-developed and well-nourished. No distress.  HENT:  Head: Normocephalic and  atraumatic.  Eyes: Pupils are equal, round, and reactive to light. EOM are normal.  Neck: Normal range of motion. Neck supple.  Cardiovascular: Normal rate and regular rhythm.  Pulmonary/Chest: Effort normal and breath sounds normal. No respiratory distress. She has no wheezes.  ? Mild wheeze  Neurological: She is alert and oriented to person, place, and time.  Skin: Skin is warm and dry.  Psychiatric: She has a normal mood and affect. Her behavior is normal. Judgment and thought content normal.     Lab Results:  CBC    Component Value Date/Time   WBC 8.7 10/01/2018 1313   RBC 4.28 10/01/2018 1313   HGB 13.5 10/01/2018 1313   HGB 13.7 03/28/2017 1130   HCT 40.5 10/01/2018 1313   HCT 41.4 03/28/2017 1130   PLT 172.0 10/01/2018 1313   PLT 210 03/28/2017 1130   MCV 94.5 10/01/2018 1313   MCV 95.1 03/28/2017 1130   MCH 32.4 08/21/2018 1656   MCHC 33.4 10/01/2018 1313   RDW 14.5 10/01/2018 1313  RDW 14.2 03/28/2017 1130   LYMPHSABS 1.0 10/01/2018 1313   LYMPHSABS 1.3 03/28/2017 1130   MONOABS 0.7 10/01/2018 1313   MONOABS 0.7 03/28/2017 1130   EOSABS 0.2 10/01/2018 1313   EOSABS 0.2 03/28/2017 1130   BASOSABS 0.1 10/01/2018 1313   BASOSABS 0.1 03/28/2017 1130    BMET    Component Value Date/Time   NA 139 08/21/2018 1656   NA 141 03/28/2017 1130   K 3.6 08/21/2018 1656   K 4.2 03/28/2017 1130   CL 104 08/21/2018 1656   CL 102 03/10/2013 1529   CO2 26 08/21/2018 1656   CO2 28 03/28/2017 1130   GLUCOSE 103 (H) 08/21/2018 1656   GLUCOSE 75 03/28/2017 1130   GLUCOSE 98 03/10/2013 1529   BUN 20 08/21/2018 1656   BUN 17.3 03/28/2017 1130   CREATININE 0.88 08/21/2018 1656   CREATININE 0.8 03/28/2017 1130   CALCIUM 9.5 08/21/2018 1656   CALCIUM 9.6 03/28/2017 1130   GFRNONAA >60 08/21/2018 1656   GFRAA >60 08/21/2018 1656    BNP No results found for: BNP  ProBNP    Component Value Date/Time   PROBNP 84.0 03/28/2011 1232    Imaging: No results  found.   Assessment & Plan:   Chronic obstructive airway disease with asthma ([place]) - IgE, FENO and eos absolute were elevated indicating a possible allergic component to asthma symptoms - Will discuss further with Dr. Chase Caller (consider newer generation biologic such as Dupixent or Xolair) - Continue Flovent and spiriva for now  - Need to complete PFTs - FU in 2-3 months    Martyn Ehrich, NP 10/02/2018

## 2018-10-02 ENCOUNTER — Telehealth: Payer: Self-pay | Admitting: Primary Care

## 2018-10-02 ENCOUNTER — Encounter: Payer: Self-pay | Admitting: Primary Care

## 2018-10-02 NOTE — Assessment & Plan Note (Signed)
-   IgE, FENO and eos absolute were elevated indicating a possible allergic component to asthma symptoms - Will discuss further with Dr. Chase Caller (consider newer generation biologic such as Dupixent or Xolair) - Continue Flovent and spiriva for now  - Need to complete PFTs - FU in 2-3 months

## 2018-10-02 NOTE — Telephone Encounter (Signed)
Hi Dr. Chase Caller,  I saw Crystal Bass yesterday for 1 month follow-up. She continues Flovent. Has not noticed any improvement with spiriva. FENO, IgE and Eos were elevated. Continues allergy shots with Dr. Donneta Romberg. She has not completed PFTs yet. Do you recommend starting biologic such as Dupixent or Xolair?   FENO 35 IgE 85 Eos 0.2

## 2018-10-06 ENCOUNTER — Telehealth: Payer: Self-pay | Admitting: Internal Medicine

## 2018-10-06 ENCOUNTER — Other Ambulatory Visit: Payer: Self-pay

## 2018-10-06 MED ORDER — BISOPROLOL FUMARATE 5 MG PO TABS: 5.0000 mg | ORAL_TABLET | Freq: Every day | ORAL | 6 refills | Status: DC

## 2018-10-06 NOTE — Telephone Encounter (Signed)
Patient calling back needing advice to proceed. Dr.Perry is off today and this patient is scheduled for tomorrow.

## 2018-10-06 NOTE — Telephone Encounter (Signed)
Thanks a lot. I think given fact she is on allergy shots and not had PFTs need to make that decision of biologic with allergist buy in. Also, recommend changing metoproll to bisoprolol first . Need to see what full PFTs are . Like to see how much of symtpoms are non-lung related. Then we can consider biologic. AT this point if you make that beta blocker change would be good enough

## 2018-10-06 NOTE — Telephone Encounter (Signed)
Will switch BP meds and will call back to schedule the PFT.  Instructed to try the new BP med for a couple weeks and if not improving to call back and schedule a follow up sooner.  Nothing further needed at this time.

## 2018-10-06 NOTE — Telephone Encounter (Signed)
Dr. [name] is off today. Dr. Fuller Plan is doc of the day and he states patient will need to reschedule her procedure. This is in alignment with Dr. Blanch Media request for patient's not to have a light breakfast the day before, completed before 9 am and this patient ate at 1130 am.

## 2018-10-06 NOTE — Telephone Encounter (Signed)
Please call patient and let her know that we will wait for her to complete PFTs and Dr. Chase Caller recommended STOPPING Metoprolol and STARTING Bisoprolol 5mg  once daily.   After those have been done if she is still having sob/wheezing we can discuss adding Biologic at next office visit.

## 2018-10-07 ENCOUNTER — Encounter: Payer: Medicare Other | Admitting: Internal Medicine

## 2018-10-09 ENCOUNTER — Encounter: Payer: Self-pay | Admitting: Internal Medicine

## 2018-10-09 ENCOUNTER — Ambulatory Visit (AMBULATORY_SURGERY_CENTER): Payer: Medicare Other | Admitting: Internal Medicine

## 2018-10-09 VITALS — BP 143/76 | HR 60 | Temp 96.0°F | Resp 16 | Ht 64.0 in | Wt 139.0 lb

## 2018-10-09 DIAGNOSIS — Z800 Family history of malignant neoplasm of digestive organs: Secondary | ICD-10-CM

## 2018-10-09 DIAGNOSIS — K209 Esophagitis, unspecified without bleeding: Secondary | ICD-10-CM

## 2018-10-09 DIAGNOSIS — K210 Gastro-esophageal reflux disease with esophagitis: Secondary | ICD-10-CM | POA: Diagnosis not present

## 2018-10-09 DIAGNOSIS — D122 Benign neoplasm of ascending colon: Secondary | ICD-10-CM

## 2018-10-09 DIAGNOSIS — K2970 Gastritis, unspecified, without bleeding: Secondary | ICD-10-CM | POA: Diagnosis not present

## 2018-10-09 DIAGNOSIS — K222 Esophageal obstruction: Secondary | ICD-10-CM

## 2018-10-09 DIAGNOSIS — K449 Diaphragmatic hernia without obstruction or gangrene: Secondary | ICD-10-CM

## 2018-10-09 DIAGNOSIS — Z1211 Encounter for screening for malignant neoplasm of colon: Secondary | ICD-10-CM

## 2018-10-09 DIAGNOSIS — R1011 Right upper quadrant pain: Secondary | ICD-10-CM

## 2018-10-09 DIAGNOSIS — K2990 Gastroduodenitis, unspecified, without bleeding: Secondary | ICD-10-CM

## 2018-10-09 DIAGNOSIS — D120 Benign neoplasm of cecum: Secondary | ICD-10-CM | POA: Diagnosis not present

## 2018-10-09 MED ORDER — OMEPRAZOLE 20 MG PO CPDR: 20.0000 mg | DELAYED_RELEASE_CAPSULE | Freq: Every day | ORAL | 11 refills | Status: DC

## 2018-10-09 MED ORDER — SODIUM CHLORIDE 0.9 % IV SOLN: 500.0000 mL | Freq: Once | INTRAVENOUS | Status: DC

## 2018-10-09 NOTE — Op Note (Signed)
[name]: Crystal Bass Procedure Date: 10/09/2018 8:01 AM MRN: [id number] Endoscopist: Docia Chuck. [name] , MD Age: 77 Referring MD:  Date of Birth: August 27, 1941 Gender: Female Account #: [id number] Procedure:                Upper GI endoscopy with biopsies and Maloney                            dilation of the esophagus?"63 [place] Indications:              Abdominal pain in the right upper quadrant,                            Dysphagia, Abnormal CT of the GI tract Medicines:                Monitored Anesthesia Care Procedure:                Pre-Anesthesia Assessment:                           - Prior to the procedure, a History and Physical                            was performed, and patient medications and                            allergies were reviewed. The patient's tolerance of                            previous anesthesia was also reviewed. The risks                            and benefits of the procedure and the sedation                            options and risks were discussed with the patient.                            All questions were answered, and informed consent                            was obtained. Prior Anticoagulants: The patient has                            taken no previous anticoagulant or antiplatelet                            agents. ASA Grade Assessment: II - A patient with                            mild systemic disease. After reviewing the risks                            and benefits, the patient was deemed in  satisfactory condition to undergo the procedure.                           After obtaining informed consent, the endoscope was                            passed under direct vision. Throughout the                            procedure, the patient's blood pressure, pulse, and                            oxygen saturations were monitored continuously. The                            Model  [id number]) scope was introduced                            through the mouth, and advanced to the second part                            of duodenum. The upper GI endoscopy was                            accomplished without difficulty. The patient                            tolerated the procedure well. Scope In: Scope Out: Findings:                 The esophagus revealed esophagitis as manifested by                            edema and friability at the mucosal Z line. As well                            there was a large caliber stricture. The scope was                            withdrawn after completing the endoscopic survey.                            Dilation was performed with a Maloney dilator with                            no resistance at 53 Fr.                           The stomach revealed a small hiatal hernia and                            erythema with superficial erosions in the antrum.  Biopsies were taken with a cold forceps for                            Helicobacter pylori testing using CLOtest.                           The examined duodenum was normal.                           The cardia and gastric fundus were normal on                            retroflexion. Complications:            No immediate complications. Estimated Blood Loss:     Estimated blood loss: none. Impression:               1. GERD with esophagitis and stricture status post                            dilation                           2. Antral gastritis status post CLO test                           3. Otherwise normal exam. Recommendation:           - Patient has a contact number available for                            emergencies. The signs and symptoms of potential                            delayed complications were discussed with the                            patient. Return to normal activities tomorrow.                            Written  discharge instructions were provided to the                            patient.                           - Post dilation diet.                           - Please prescribe omeprazole 20 mg daily; #30; 11                            refills. This is to treat esophageal and gastric                            inflammation as well as help with swallowing  issues. Take every day.                           - Await pathology results.                           - Return to the care of your primary providers. GI                            follow-up as needed Docia Chuck. [name], MD 10/09/2018 9:20:57 AM This report has been signed electronically.

## 2018-10-09 NOTE — Patient Instructions (Signed)
YOU HAD AN ENDOSCOPIC PROCEDURE TODAY AT [facility]:   Refer to the procedure report that was given to you for any specific questions about what was found during the examination.  If the procedure report does not answer your questions, please call your gastroenterologist to clarify.  If you requested that your care partner not be given the details of your procedure findings, then the procedure report has been included in a sealed envelope for you to review at your convenience later.  YOU SHOULD EXPECT: Some feelings of bloating in the abdomen. Passage of more gas than usual.  Walking can help get rid of the air that was put into your GI tract during the procedure and reduce the bloating. If you had a lower endoscopy (such as a colonoscopy or flexible sigmoidoscopy) you may notice spotting of blood in your stool or on the toilet paper. If you underwent a bowel prep for your procedure, you may not have a normal bowel movement for a few days.  Please Note:  You might notice some irritation and congestion in your nose or some drainage.  This is from the oxygen used during your procedure.  There is no need for concern and it should clear up in a day or so.  SYMPTOMS TO REPORT IMMEDIATELY:   Following lower endoscopy (colonoscopy or flexible sigmoidoscopy):  Excessive amounts of blood in the stool  Significant tenderness or worsening of abdominal pains  Swelling of the abdomen that is new, acute  Fever of 100F or higher   Following upper endoscopy (EGD)  Vomiting of blood or coffee ground material  New chest pain or pain under the shoulder blades  Painful or persistently difficult swallowing  New shortness of breath  Fever of 100F or higher  Black, tarry-looking stools  For urgent or emergent issues, a gastroenterologist can be reached at any hour by calling [phone].   DIET:  Follow a post-dilation diet today (see handout given to you by your recovery nurse), but then  you may proceed to your regular diet tomorrow as tolerated.  Drink plenty of fluids but you should avoid alcoholic beverages for 24 hours.  MEDICATIONS: Continue present medications.Omeprazole 20 mg by mouth daily (30 tablets with 11 refills). This is to treat esophageal and gastric inflammation as well as to help with swallowing issues. Take this every day.  ACTIVITY:  You should plan to take it easy for the rest of today and you should NOT DRIVE or use heavy machinery until tomorrow (because of the sedation medicines used during the test).    FOLLOW UP: Our staff will call the number listed on your records the next business day following your procedure to check on you and address any questions or concerns that you may have regarding the information given to you following your procedure. If we do not reach you, we will leave a message.  However, if you are feeling well and you are not experiencing any problems, there is no need to return our call.  We will assume that you have returned to your regular daily activities without incident.  If any biopsies were taken you will be contacted by phone or by letter within the next 1-3 weeks.  Please call us at [phone] if you have not heard about the biopsies in 3 weeks.   Thank you for allowing [place] to provide for your healthcare needs today.   SIGNATURES/CONFIDENTIALITY: You and/or your care partner have signed paperwork which will be  entered into your electronic medical record.  These signatures attest to the fact that that the information above on your After Visit Summary has been reviewed and is understood.  Full responsibility of the confidentiality of this discharge information lies with you and/or your care-partner. 

## 2018-10-09 NOTE — Progress Notes (Signed)
Called to room to assist during endoscopic procedure.  Patient ID and intended procedure confirmed with present staff. Received instructions for my participation in the procedure from the performing physician.  

## 2018-10-09 NOTE — Op Note (Signed)
[facility] Patient Name: Crystal Bass Procedure Date: 10/09/2018 8:01 AM MRN: [id number] Endoscopist: Docia Chuck. [name] , MD Age: 77 Referring MD:  Date of Birth: 08/14/1941 Gender: Female Account #: [id number] Procedure:                Colonoscopy with cold snare polypectomy x 1 Indications:              Screening for colorectal malignant neoplasm. Sister                            with a history of colon cancer around age 75.                            Previous examinations in 2007 and 2014 were                            negative for neoplasia Medicines:                Monitored Anesthesia Care Procedure:                Pre-Anesthesia Assessment:                           - Prior to the procedure, a History and Physical                            was performed, and patient medications and                            allergies were reviewed. The patient's tolerance of                            previous anesthesia was also reviewed. The risks                            and benefits of the procedure and the sedation                            options and risks were discussed with the patient.                            All questions were answered, and informed consent                            was obtained. Prior Anticoagulants: The patient has                            taken no previous anticoagulant or antiplatelet                            agents. ASA Grade Assessment: II - A patient with                            mild systemic disease. After reviewing the risks  and benefits, the patient was deemed in                            satisfactory condition to undergo the procedure.                           After obtaining informed consent, the colonoscope                            was passed under direct vision. Throughout the                            procedure, the patient's blood pressure, pulse, and                            oxygen  saturations were monitored continuously. The                            Colonoscope was introduced through the anus and                            advanced to the the cecum, identified by                            appendiceal orifice and ileocecal valve. The                            ileocecal valve, appendiceal orifice, and rectum                            were photographed. The quality of the bowel                            preparation was excellent. The colonoscopy was                            performed without difficulty. The patient tolerated                            the procedure well. The bowel preparation used was                            SUPREP. Scope In: 8:38:45 AM Scope Out: 8:57:13 AM Scope Withdrawal Time: 0 hours 14 minutes 14 seconds  Total Procedure Duration: 0 hours 18 minutes 28 seconds  Findings:                 Two polyps were found in the ascending colon and                            cecum. The polyps were 1 to 3 mm in size. These                            polyps were removed with a cold snare. Resection  and retrieval were complete.                           Internal hemorrhoids were found during retroflexion.                           The exam was otherwise without abnormality on                            direct and retroflexion views. Complications:            No immediate complications. Estimated blood loss:                            None. Estimated Blood Loss:     Estimated blood loss: none. Impression:               - Two 1 to 3 mm polyps in the ascending colon and                            in the cecum, removed with a cold snare. Resected                            and retrieved.                           - Internal hemorrhoids.                           - The examination was otherwise normal on direct                            and retroflexion views. Recommendation:           - Repeat colonoscopy is not recommended  for                            surveillance.                           - Patient has a contact number available for                            emergencies. The signs and symptoms of potential                            delayed complications were discussed with the                            patient. Return to normal activities tomorrow.                            Written discharge instructions were provided to the                            patient.                           -  Resume previous diet.                           - Continue present medications.                           - Await pathology results. Docia Chuck. [name], MD 10/09/2018 9:15:51 AM This report has been signed electronically.

## 2018-10-09 NOTE — Progress Notes (Signed)
PT taken to PACU. Monitors in place. VSS. Report given to RN. 

## 2018-10-10 ENCOUNTER — Telehealth: Payer: Self-pay | Admitting: *Deleted

## 2018-10-10 LAB — HELICOBACTER PYLORI SCREEN-BIOPSY: UREASE: NEGATIVE

## 2018-10-10 NOTE — Telephone Encounter (Signed)
  Follow up Call-  Call back number 10/09/2018  Post procedure Call Back phone  # [phone]  Permission to leave phone message Yes  Some recent data might be hidden     Patient questions:  Do you have a fever, pain , or abdominal swelling? No. Pain Score  0 *  Have you tolerated food without any problems? Yes.    Have you been able to return to your normal activities? Yes.    Do you have any questions about your discharge instructions: Diet   No. Medications  No. Follow up visit  No.  Do you have questions or concerns about your Care? No.  Actions: * If pain score is 4 or above: No action needed, pain <4.

## 2018-10-15 ENCOUNTER — Encounter: Payer: Self-pay | Admitting: Internal Medicine

## 2018-10-22 ENCOUNTER — Ambulatory Visit: Payer: Medicare Other | Admitting: Cardiovascular Disease

## 2018-10-25 ENCOUNTER — Other Ambulatory Visit: Payer: Self-pay

## 2018-10-25 ENCOUNTER — Emergency Department: Payer: Medicare Other

## 2018-10-25 ENCOUNTER — Observation Stay
Admission: EM | Admit: 2018-10-25 | Discharge: 2018-10-27 | Disposition: A | Discharge status: Home / Self Care | Payer: Medicare Other | Attending: Internal Medicine | Admitting: Internal Medicine

## 2018-10-25 DIAGNOSIS — R079 Chest pain, unspecified: Secondary | ICD-10-CM | POA: Diagnosis present

## 2018-10-25 DIAGNOSIS — A310 Pulmonary mycobacterial infection: Secondary | ICD-10-CM

## 2018-10-25 DIAGNOSIS — I82409 Acute embolism and thrombosis of unspecified deep veins of unspecified lower extremity: Secondary | ICD-10-CM | POA: Diagnosis present

## 2018-10-25 DIAGNOSIS — Z79899 Other long term (current) drug therapy: Secondary | ICD-10-CM | POA: Insufficient documentation

## 2018-10-25 DIAGNOSIS — E559 Vitamin D deficiency, unspecified: Secondary | ICD-10-CM | POA: Insufficient documentation

## 2018-10-25 DIAGNOSIS — G20 Parkinson's disease: Secondary | ICD-10-CM | POA: Diagnosis not present

## 2018-10-25 DIAGNOSIS — K219 Gastro-esophageal reflux disease without esophagitis: Secondary | ICD-10-CM | POA: Insufficient documentation

## 2018-10-25 DIAGNOSIS — R0789 Other chest pain: Principal | ICD-10-CM | POA: Insufficient documentation

## 2018-10-25 DIAGNOSIS — I10 Essential (primary) hypertension: Secondary | ICD-10-CM | POA: Insufficient documentation

## 2018-10-25 DIAGNOSIS — J440 Chronic obstructive pulmonary disease with acute lower respiratory infection: Secondary | ICD-10-CM | POA: Insufficient documentation

## 2018-10-25 DIAGNOSIS — Z66 Do not resuscitate: Secondary | ICD-10-CM | POA: Insufficient documentation

## 2018-10-25 DIAGNOSIS — I2699 Other pulmonary embolism without acute cor pulmonale: Secondary | ICD-10-CM | POA: Insufficient documentation

## 2018-10-25 LAB — BASIC METABOLIC PANEL
Anion gap: 9 (ref 5–15)
BUN: 19 mg/dL (ref 8–23)
CO2: 28 mmol/L (ref 22–32)
Calcium: 8.7 mg/dL — ABNORMAL LOW (ref 8.9–10.3)
Chloride: 103 mmol/L (ref 98–111)
Creatinine, Ser: 0.81 mg/dL (ref 0.44–1.00)
GFR calc Af Amer: >60 mL/min (ref >60)
GFR calc non Af Amer: >60 mL/min (ref >60)
Glucose, Bld: 106 mg/dL — ABNORMAL HIGH (ref 70–99)
POTASSIUM: 3.6 mmol/L (ref 3.5–5.1)
Sodium: 140 mmol/L (ref 135–145)

## 2018-10-25 LAB — CBC
HCT: 40.7 % (ref 36.0–46.0)
Hemoglobin: 12.6 g/dL (ref 12.0–15.0)
MCH: 30.7 pg (ref 26.0–34.0)
MCHC: 31 g/dL (ref 30.0–36.0)
MCV: 99 fL (ref 80.0–100.0)
Platelets: 194 10*3/uL (ref 150–400)
RBC: 4.11 MIL/uL (ref 3.87–5.11)
RDW: 13.6 % (ref 11.5–15.5)
WBC: 9.5 10*3/uL (ref 4.0–10.5)
nRBC: 0 % (ref 0.0–0.2)

## 2018-10-25 LAB — HEPATIC FUNCTION PANEL
ALT: 10 U/L (ref 0–44)
AST: 18 U/L (ref 15–41)
Albumin: 3.5 g/dL (ref 3.5–5.0)
Alkaline Phosphatase: 83 U/L (ref 38–126)
Bilirubin, Direct: 0.2 mg/dL (ref 0.0–0.2)
Indirect Bilirubin: 1 mg/dL — ABNORMAL HIGH (ref 0.3–0.9)
Total Bilirubin: 1.2 mg/dL (ref 0.3–1.2)
Total Protein: 6.6 g/dL (ref 6.5–8.1)

## 2018-10-25 LAB — LIPASE, BLOOD: Lipase: 31 U/L (ref 11–51)

## 2018-10-25 LAB — TROPONIN I: Troponin I: <0.03 ng/mL (ref <0.03)

## 2018-10-25 MED ORDER — FAMOTIDINE IN NACL 20-0.9 MG/50ML-% IV SOLN
20.0000 mg | Freq: Once | INTRAVENOUS | Status: AC
  Administered 2018-10-26: 20 mg via INTRAVENOUS
  Filled 2018-10-25: qty 50

## 2018-10-25 MED ORDER — SODIUM CHLORIDE 0.9 % IV BOLUS
1000.0000 mL | Freq: Once | INTRAVENOUS | Status: AC
  Administered 2018-10-26: 1000 mL via INTRAVENOUS

## 2018-10-25 MED ORDER — ALBUTEROL SULFATE (2.5 MG/3ML) 0.083% IN NEBU
2.5000 mg | INHALATION_SOLUTION | Freq: Once | RESPIRATORY_TRACT | Status: AC
  Administered 2018-10-26: 2.5 mg via RESPIRATORY_TRACT
  Filled 2018-10-25: qty 3

## 2018-10-25 NOTE — ED Triage Notes (Signed)
Patient to [facility] from home (assisted living) via EMS for chest pain.  EMS reports patient complained of mid sternal chest pain that radiated into her back that started at 7 pm.  EMS treatment:  Patient given NGT x 1, ASA 325 mg, Saline loc to right forearm via 20 g angiocath, cbg 124, vital signs - hr 71; initial bp 193/106, last bp 176/94, pulse oxi 95% on room air.

## 2018-10-25 NOTE — ED Provider Notes (Signed)
[facility] Emergency Department Provider Note   ____________________________________________   First MD Initiated Contact with Patient 10/25/18 2305     (approximate)  I have reviewed the triage vital signs and the nursing notes.   HISTORY  Chief Complaint Chest Pain    HPI Crystal Bass is a 77 y.o. female brought to the ED via EMS from assisted living facility with a chief complaint of chest pain.  Patient reports left-sided sharp chest pain which started approximately 7 PM and radiates to her left shoulder.  She took 2 Tylenol prior to arrival and was also given full-strength aspirin as well as 1 nitroglycerin spray per EMS.  Patient has COPD and complains of chronic dry cough.  Felt tight in her chest yesterday with increasing cough.  Denies associated fever, chills, shortness of breath, abdominal pain, nausea or vomiting.  Denies recent travel or trauma.  Had EGD and colonoscopy on 11/14 demonstrating esophagitis.  She was recommended to start omeprazole which she has not yet started.  Also had her metoprolol changed to bisoprolol recently.   Past Medical History:  Diagnosis Date  . 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 Aortic root aneurysm ([place])   . Arthritis   . Asthma   . Breast cancer ([place]) 2002   infiltrative ductal carcinoma   . Cataract 2019  . COPD (chronic obstructive pulmonary disease) ([place]) 9/08  . Ductal carcinoma of breast, estrogen receptor positive, stage 1 ([place]) 09/16/2012  . Endometriosis   . GERD (gastroesophageal reflux disease)   . Hypercalcemia 09/14/2013  . Hypertension   . Lesion of breast 1992   right, benign  . Lung disease    secondary to [name] infection  . Mastalgia 7/95  . Osteoporosis, post-menopausal 03/14/2012  . Ovarian cancer ([place]) 2004  . Pancreatitis    secondary to Cholelithiasis  . Parkinson disease ([place]) 02/10/15  . Post-thoracotomy pain syndrome   . Pseudomonas  pneumonia ([place]) 5/09  . Uterine fibroid   . Vitamin D deficiency     Patient Active Problem List   Diagnosis Date Noted  . Orthostatic hypotension 08/25/2018  . Left patella fracture 04/25/2018  . Right ankle injury, initial encounter 04/25/2018  . Renal cyst, acquired, right 02/02/2018  . Advanced care planning/counseling discussion 01/26/2018  . Liver lesion 01/21/2018  . Low serum vitamin B12 01/21/2018  . Right-sided chest wall pain 01/21/2018  . Chronic fatigue 10/23/2017  . Lesion of left lung 10/23/2017  . Genetic testing 05/01/2017  . Encounter for general adult medical examination with abnormal findings 12/27/2016  . Parkinson disease ([place]) 01/10/2016  . DNR (do not resuscitate) 05/22/2015  . Tremor 02/07/2015  . Dysphagia, pharyngoesophageal phase 02/07/2015  . Hypercalcemia 09/14/2013  . Unspecified vitamin D deficiency 04/27/2013  . Breast cancer of upper-outer quadrant of left female breast ([place]) 09/16/2012  . Osteoporosis, post-menopausal 03/14/2012  . Aortic aneurysm ([place]) 01/01/2011  . OSTEOARTHRITIS, CERVICAL SPINE 08/15/2009  . ALLERGIC RHINITIS 03/25/2008  . Chronic obstructive airway disease with asthma ([place]) 03/25/2008  . BREAST CANCER, HX OF 03/10/2008  . Mixed hyperlipidemia 02/10/2007  . Essential hypertension 02/10/2007    Past Surgical History:  Procedure Laterality Date  . 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 APPENDECTOMY  2004  . BREAST BIOPSY  08/2004   right breast-benign  . BREAST LUMPECTOMY  1992   benign  .  CARDIAC CATHETERIZATION  2007   essentially negation for significant CAD  . CHOLECYSTECTOMY  2001  . COLONOSCOPY  2014   Dr [name] , due 2019  . LUNG BIOPSY  2002   [name], Dr [name]  . MASTECTOMY MODIFIED RADICAL Left 2002   oral chemotheraphy (tamoxifen then Armidex) no radiation, Dr.[name]  . TONSILLECTOMY  1946    Prior to Admission medications   Medication Sig Start Date  End Date Taking? Authorizing Provider  acetaminophen (TYLENOL) 500 MG tablet Take 2 tablets (1,000 mg total) by mouth 2 (two) times daily as needed. 01/21/18   [name], MD  Albuterol Sulfate ([name]) 546 (90 Base) MCG/ACT AEPB Inhale 2 puffs into the lungs every 6 (six) hours as needed (dyspnea, wheezing).    [provider]  aspirin 81 MG tablet Take 81 mg by mouth daily.      [provider]  bisoprolol (ZEBETA) 5 MG tablet Take 1 tablet (5 mg total) by mouth daily. Patient not taking: Reported on 10/09/2018 10/06/18   Martyn Ehrich, NP  carbidopa-levodopa ([name]) 25-100 MG tablet TAKE 2 TABLETS IN THE MORNING, 1 TABLET IN THE AFTERNOON, 1 IN THE EVENING 05/02/18   Tat, Eustace Quail, DO  clonazePAM (KLONOPIN) 0.5 MG tablet TAKE 1/2 TABLET BY MOUTH EVERY DAY AT BEDTIME 09/15/18   Tat, Rebecca S, DO  fluticasone ([name]) 110 MCG/ACT inhaler Inhale 2 puffs into the lungs 2 (two) times daily.    [provider]  gabapentin ([name]) 300 MG capsule Take 2 capsules (600 mg total) by mouth 3 (three) times daily. 01/21/18   [name], MD  omeprazole (PRILOSEC) 20 MG capsule Take 1 capsule (20 mg total) by mouth daily. 10/09/18   Irene Shipper, MD  predniSONE (DELTASONE) 10 MG tablet Take 4 tabs po daily x 2 days; then 3 tabs for 2 days; then 2 tabs for 2 days; then 1 tab for 2 days Patient not taking: Reported on 10/09/2018 10/01/18   Martyn Ehrich, NP  Tiotropium Bromide Monohydrate (SPIRIVA RESPIMAT) 1.25 MCG/ACT AERS Inhale 2 puffs into the lungs daily. 09/05/18   Laurin Coder, MD  Vitamin D, Ergocalciferol, ([name]) 50000 units CAPS capsule TAKE 1 CAPSULE BY MOUTH EVERY WEEK 01/03/18   Megan Salon, MD    Allergies Sulfonamide derivatives; Topamax [topiramate]; Biaxin [clarithromycin]; Hydrocodone; Motrin [ibuprofen]; Symbicort [budesonide-formoterol fumarate]; Percocet [oxycodone-acetaminophen]; Tape; and Tetracyclines &  related  Family History  Problem Relation Age of Onset  . Lung cancer Father 52       d.82 history of smoking  . Breast cancer Sister 68  . Colon cancer Sister   . Emphysema Mother        d.64 was never a smoker  . Stroke Paternal Aunt        in mid 47s  . Osteoporosis Paternal Aunt   . Melanoma Maternal Aunt   . Cancer Maternal Uncle        unspecified type  . Cancer Maternal Grandmother        d.82s unspecified GI cancer  . Cancer Maternal Grandfather        d.62s unspecified type  . Liver cancer Maternal Aunt   . Cancer Maternal Uncle        unspecified type  . Cancer Maternal Uncle        unspecified type  . Myasthenia gravis Paternal Aunt   . Breast cancer Cousin        d.60s-daughter of unaffected  paternal aunt Eustace Moore  . Colon cancer Cousin 10       d.70-daughter of unaffected maternal aunt Leila  . Lung cancer Cousin 70       d.70-sisters to each other, both daughters of maternal uncle Johnny  . Diabetes Neg Hx   . Heart disease Neg Hx   . Stomach cancer Neg Hx   . Ulcerative colitis Neg Hx     Social History Social History   Tobacco Use  . Smoking status: Never Smoker  . Smokeless tobacco: Never Used  Substance Use Topics  . Alcohol use: No    Alcohol/week: 0.0 standard drinks  . Drug use: No    Review of Systems  Constitutional: No fever/chills Eyes: No visual changes. ENT: No sore throat. Cardiovascular: Positive for chest pain. Respiratory: Denies shortness of breath. Gastrointestinal: No abdominal pain.  No nausea, no vomiting.  No diarrhea.  No constipation. Genitourinary: Negative for dysuria. Musculoskeletal: Negative for back pain. Skin: Negative for rash. Neurological: Negative for headaches, focal weakness or numbness.   ____________________________________________   PHYSICAL EXAM:  VITAL SIGNS: ED Triage Vitals  Enc Vitals Group     BP 10/25/18 2229 (!) 178/96     Pulse Rate 10/25/18 2229 69     Resp 10/25/18 2229 14     Temp  10/25/18 2229 98.7 F (37.1 C)     Temp Source 10/25/18 2229 Oral     SpO2 10/25/18 2229 95 %     Weight 10/25/18 2230 138 lb (62.6 kg)     Height 10/25/18 2230 5\' 2"  (1.575 m)     Head Circumference --      Peak Flow --      Pain Score 10/25/18 2230 2     Pain Loc --      Pain Edu? --      Excl. in [place]? --     Constitutional: Alert and oriented. Well appearing and in no acute distress. Eyes: Conjunctivae are normal. PERRL. EOMI. Head: Atraumatic. Nose: No congestion/rhinnorhea. Mouth/Throat: Mucous membranes are moist.  Oropharynx non-erythematous. Neck: No stridor.   Cardiovascular: Normal rate, regular rhythm. Grossly normal heart sounds.  Good peripheral circulation. Respiratory: Normal respiratory effort.  No retractions. Lungs CTAB. Gastrointestinal: Soft and nontender to light or deep palpation. No distention. No abdominal bruits. No CVA tenderness. Musculoskeletal: No lower extremity tenderness nor edema.  No joint effusions. Neurologic:  Normal speech and language. No gross focal neurologic deficits are appreciated.  Skin:  Skin is warm, dry and intact. No rash noted. Psychiatric: Mood and affect are normal. Speech and behavior are normal.  ____________________________________________   LABS (all labs ordered are listed, but only abnormal results are displayed)  Labs Reviewed  BASIC METABOLIC PANEL - Abnormal; Notable for the following components:      Result Value   Glucose, Bld 106 (*)    Calcium 8.7 (*)    All other components within normal limits  HEPATIC FUNCTION PANEL - Abnormal; Notable for the following components:   Indirect Bilirubin 1.0 (*)    All other components within normal limits  CULTURE, BLOOD (ROUTINE X 2)  CULTURE, BLOOD (ROUTINE X 2)  CBC  TROPONIN I  LIPASE, BLOOD  TROPONIN I  LACTIC ACID, PLASMA  LACTIC ACID, PLASMA   ____________________________________________  EKG  ED ECG REPORT I, SUNG,JADE J, the attending physician,  personally viewed and interpreted this ECG.   Date: 10/25/2018  EKG Time: 2223  Rate: 68  Rhythm: normal EKG, normal  sinus rhythm  Axis: Normal  Intervals:LVH  ST&T Change: Nonspecific  ____________________________________________  RADIOLOGY  ED MD interpretation: Bilateral coarsened lung markings; CT demonstrates segmental PE and infection, likely [name]  Official radiology report(s): Dg Chest 2 View  Result Date: 10/25/2018 CLINICAL DATA:  Midsternal chest pain. EXAM: CHEST - 2 VIEW COMPARISON:  August 21, 2017 FINDINGS: No pneumothorax. Cardiomegaly. The hila and mediastinum are unremarkable. Coarsened lung markings. No focal infiltrate. No other acute abnormalities. IMPRESSION: Bilateral coarsened lung markings may represent bronchitic change versus atypical infection. No other interval change. Electronically Signed   By: Dorise Bullion III M.D   On: 10/25/2018 23:07   Ct Angio Chest Pe W/cm &/or Wo Cm  Result Date: 10/26/2018 CLINICAL DATA:  Mid sternal chest pain radiating to the back beginning at 7 p.m. last night. EXAM: CT ANGIOGRAPHY CHEST WITH CONTRAST TECHNIQUE: Multidetector CT imaging of the chest was performed using the standard protocol during bolus administration of intravenous contrast. Multiplanar CT image reconstructions and MIPs were obtained to evaluate the vascular anatomy. CONTRAST:  62mL OMNIPAQUE IOHEXOL 350 MG/ML SOLN COMPARISON:  Chest radiograph, 10/25/2018.  Chest CT, 08/21/2018. FINDINGS: Cardiovascular: There is satisfactory opacification of the pulmonary arteries to the segmental level. There is a single small nonocclusive pulmonary embolus in a left upper lobe, anterior segmental branch. No other pulmonary emboli. Heart is mildly enlarged. No pericardial effusion. Minor left coronary artery calcifications. Mild aortic atherosclerosis. Ascending thoracic aorta measures a maximum of 3.9 cm. Mediastinum/Nodes: No neck base, axillary, mediastinal or hilar masses  or pathologically enlarged lymph nodes. Lungs/Pleura: There lung nodules and area bronchiectasis with mucous plugging. Bronchiectasis with mucous plugging has increased when compared to the prior CT, most evident in the left lower lobe. There are additional nodules, an 8 mm nodule in the right upper lobe, image 32, series 6 new since the prior CT. Irregular nodule in the right lower lobe, image 47, measures 11 mm, stable from the prior CT. There also bronchial wall thickening with bronchial narrowing is noted to the superior segment of the left lower lobe increased prior CT. Focal consolidation is noted in the anteromedial left upper lobe centered on image 39, and base of the right middle lobe, new from prior CT. There also areas ground-glass opacity in both lungs. No pleural effusion. No pneumothorax. Stable pulmonary anastomosis staples in the right upper lobe. Upper Abdomen: No acute findings. Musculoskeletal: Levoscoliosis of the upper thoracic spine. No fracture or acute finding. No osteoblastic or osteolytic lesions. Review of the MIP images confirms the above findings. IMPRESSION: 1. Single small pulmonary embolism noted in an apical segmental branch to the left upper lobe. No other evidence of a pulmonary embolism. 2. Worsened lung aeration compared to the prior CT. Lung abnormalities are predominantly areas of bronchiectasis mucous plugging with areas tree-in-bud type opacities. Also lung nodules as described, areas of ground-glass opacity and areas of consolidation, the latter are noted in the left upper lobe and base of the right middle lobe. Overall, findings are consistent progression atypical infection such as [name] or noninfectious inflammatory process. Aortic Atherosclerosis (ICD10-I70.0). Electronically Signed   By: Lajean Manes M.D.   On: 10/26/2018 00:41    ____________________________________________   PROCEDURES  Procedure(s) performed: None  Procedures  Critical Care performed: Yes,  see critical care note(s)   CRITICAL CARE Performed by: Paulette Blanch   Total critical care time: 45 minutes  Critical care time was exclusive of separately billable procedures and treating other patients.  Critical care  was necessary to treat or prevent imminent or life-threatening deterioration.  Critical care was time spent personally by me on the following activities: development of treatment plan with patient and/or surrogate as well as nursing, discussions with consultants, evaluation of patient's response to treatment, examination of patient, obtaining history from patient or surrogate, ordering and performing treatments and interventions, ordering and review of laboratory studies, ordering and review of radiographic studies, pulse oximetry and re-evaluation of patient's condition.  ____________________________________________   INITIAL IMPRESSION / ASSESSMENT AND PLAN / ED COURSE  As part of my medical decision making, I reviewed the following data within the [place] notes reviewed and incorporated, Labs reviewed, EKG interpreted, Old chart reviewed, Radiograph reviewed and Notes from prior ED visits   77 year old female who presents with chest pain. Differential diagnosis includes, but is not limited to, ACS, aortic dissection, pulmonary embolism, cardiac tamponade, pneumothorax, pneumonia, pericarditis, myocarditis, GI-related causes including esophagitis/gastritis, and musculoskeletal chest wall pain.    Recent endoscopy demonstrating gastritis; will administer 20 mg IV Pepcid.  Patient currently voices no chest pain.  I did note in her records that patient also has a 4 cm aortic root aneurysm; this was last imaged at the end of September.  Low suspicion for rupture.  Given the pleuritic nature of patient's chest pain, will obtain CTA chest to evaluate for pulmonary embolus.  Clinical Course as of Oct 26 106  Nancy Fetter Oct 26, 2018  0105 Updated patient of  all test results.  Discussed with hospitalist who will evaluate patient in the emergency department for admission.  will start heparin bolus and drip.  Would prefer to give azithromycin or clarithromycin for possible [name]; however, patient is allergic.  I reviewed up-to-date and will instead order 500 mg IV Levaquin.   [JS]    Clinical Course User Index [JS] Paulette Blanch, MD     ____________________________________________   FINAL CLINICAL IMPRESSION(S) / ED DIAGNOSES  Final diagnoses:  Nonspecific chest pain  PE (pulmonary thromboembolism) ([place])  Atypical mycobacterial infection of lung [facility])     ED Discharge Orders    None       Note:  This document was prepared using Dragon voice recognition software and may include unintentional dictation errors.    Paulette Blanch, MD 10/26/18 [phone]

## 2018-10-26 ENCOUNTER — Observation Stay: Payer: Medicare Other

## 2018-10-26 ENCOUNTER — Emergency Department: Payer: Medicare Other

## 2018-10-26 ENCOUNTER — Encounter: Payer: Self-pay | Admitting: Radiology

## 2018-10-26 DIAGNOSIS — R079 Chest pain, unspecified: Secondary | ICD-10-CM | POA: Diagnosis present

## 2018-10-26 LAB — CBC
HCT: 40 % (ref 36.0–46.0)
Hemoglobin: 12.5 g/dL (ref 12.0–15.0)
MCH: 30.9 pg (ref 26.0–34.0)
MCHC: 31.3 g/dL (ref 30.0–36.0)
MCV: 99 fL (ref 80.0–100.0)
Platelets: 176 10*3/uL (ref 150–400)
RBC: 4.04 MIL/uL (ref 3.87–5.11)
RDW: 13.7 % (ref 11.5–15.5)
WBC: 9.5 10*3/uL (ref 4.0–10.5)
nRBC: 0 % (ref 0.0–0.2)

## 2018-10-26 LAB — PROTIME-INR
INR: 1
Prothrombin Time: 13.1 seconds (ref 11.4–15.2)

## 2018-10-26 LAB — TROPONIN I
Troponin I: <0.03 ng/mL (ref <0.03)
Troponin I: <0.03 ng/mL (ref <0.03)
Troponin I: <0.03 ng/mL (ref <0.03)

## 2018-10-26 LAB — APTT: APTT: 36 s (ref 24–36)

## 2018-10-26 LAB — LACTIC ACID, PLASMA: Lactic Acid, Venous: 1.4 mmol/L (ref 0.5–1.9)

## 2018-10-26 LAB — TSH: TSH: 1.893 u[IU]/mL (ref 0.350–4.500)

## 2018-10-26 MED ORDER — APIXABAN 5 MG PO TABS: 5.0000 mg | ORAL_TABLET | Freq: Two times a day (BID) | ORAL | Status: DC

## 2018-10-26 MED ORDER — CLONAZEPAM 0.5 MG PO TABS
0.2500 mg | ORAL_TABLET | Freq: Every day | ORAL | Status: DC
  Administered 2018-10-26: 0.25 mg via ORAL
  Filled 2018-10-26: qty 1

## 2018-10-26 MED ORDER — ALBUTEROL SULFATE (2.5 MG/3ML) 0.083% IN NEBU: 2.5000 mg | INHALATION_SOLUTION | RESPIRATORY_TRACT | Status: DC | PRN

## 2018-10-26 MED ORDER — ACETAMINOPHEN 325 MG PO TABS
650.0000 mg | ORAL_TABLET | Freq: Four times a day (QID) | ORAL | Status: DC | PRN
  Administered 2018-10-26 – 2018-10-27 (×2): 650 mg via ORAL
  Filled 2018-10-26 (×2): qty 2

## 2018-10-26 MED ORDER — CARBIDOPA-LEVODOPA 25-100 MG PO TABS
2.0000 | ORAL_TABLET | ORAL | Status: DC
  Administered 2018-10-26 – 2018-10-27 (×2): 2 via ORAL
  Filled 2018-10-26 (×2): qty 2

## 2018-10-26 MED ORDER — IOHEXOL 350 MG/ML SOLN
75.0000 mL | Freq: Once | INTRAVENOUS | Status: AC | PRN
  Administered 2018-10-26: 75 mL via INTRAVENOUS

## 2018-10-26 MED ORDER — ONDANSETRON HCL 4 MG PO TABS: 4.0000 mg | ORAL_TABLET | Freq: Four times a day (QID) | ORAL | Status: DC | PRN

## 2018-10-26 MED ORDER — ASPIRIN 81 MG PO CHEW
81.0000 mg | CHEWABLE_TABLET | Freq: Every day | ORAL | Status: DC
  Administered 2018-10-26 – 2018-10-27 (×2): 81 mg via ORAL
  Filled 2018-10-26 (×2): qty 1

## 2018-10-26 MED ORDER — BISOPROLOL FUMARATE 5 MG PO TABS
5.0000 mg | ORAL_TABLET | Freq: Every day | ORAL | Status: DC
  Administered 2018-10-26 – 2018-10-27 (×2): 5 mg via ORAL
  Filled 2018-10-26 (×3): qty 1

## 2018-10-26 MED ORDER — ONDANSETRON HCL 4 MG/2ML IJ SOLN: 4.0000 mg | Freq: Four times a day (QID) | INTRAMUSCULAR | Status: DC | PRN

## 2018-10-26 MED ORDER — HEPARIN SODIUM (PORCINE) 5000 UNIT/ML IJ SOLN
60.0000 [IU]/kg | Freq: Once | INTRAMUSCULAR | Status: AC
  Administered 2018-10-26: 3750 [IU] via INTRAVENOUS

## 2018-10-26 MED ORDER — CARBIDOPA-LEVODOPA 25-100 MG PO TABS
1.0000 | ORAL_TABLET | Freq: Every day | ORAL | Status: DC
  Administered 2018-10-26 – 2018-10-27 (×2): 1 via ORAL
  Filled 2018-10-26 (×3): qty 1

## 2018-10-26 MED ORDER — APIXABAN 5 MG PO TABS
10.0000 mg | ORAL_TABLET | Freq: Two times a day (BID) | ORAL | Status: DC
  Administered 2018-10-26 – 2018-10-27 (×3): 10 mg via ORAL
  Filled 2018-10-26 (×3): qty 2

## 2018-10-26 MED ORDER — HEPARIN (PORCINE) 25000 UT/250ML-% IV SOLN
1100.0000 [IU]/h | INTRAVENOUS | Status: DC
  Administered 2018-10-26: 1100 [IU]/h via INTRAVENOUS
  Filled 2018-10-26: qty 250

## 2018-10-26 MED ORDER — LEVOFLOXACIN IN D5W 500 MG/100ML IV SOLN
500.0000 mg | Freq: Once | INTRAVENOUS | Status: AC
  Administered 2018-10-26: 500 mg via INTRAVENOUS
  Filled 2018-10-26: qty 100

## 2018-10-26 MED ORDER — ACETAMINOPHEN 650 MG RE SUPP: 650.0000 mg | Freq: Four times a day (QID) | RECTAL | Status: DC | PRN

## 2018-10-26 MED ORDER — PANTOPRAZOLE SODIUM 40 MG PO TBEC
40.0000 mg | DELAYED_RELEASE_TABLET | Freq: Every day | ORAL | Status: DC
  Administered 2018-10-26 – 2018-10-27 (×2): 40 mg via ORAL
  Filled 2018-10-26 (×2): qty 1

## 2018-10-26 MED ORDER — TIOTROPIUM BROMIDE MONOHYDRATE 18 MCG IN CAPS
1.0000 | ORAL_CAPSULE | Freq: Every day | RESPIRATORY_TRACT | Status: DC
  Administered 2018-10-26 – 2018-10-27 (×2): 18 ug via RESPIRATORY_TRACT
  Filled 2018-10-26: qty 5

## 2018-10-26 MED ORDER — CARBIDOPA-LEVODOPA 25-100 MG PO TABS
1.0000 | ORAL_TABLET | Freq: Every evening | ORAL | Status: DC
  Administered 2018-10-26: 1 via ORAL
  Filled 2018-10-26 (×3): qty 1

## 2018-10-26 MED ORDER — GABAPENTIN 300 MG PO CAPS
600.0000 mg | ORAL_CAPSULE | Freq: Three times a day (TID) | ORAL | Status: DC
  Administered 2018-10-26 – 2018-10-27 (×4): 600 mg via ORAL
  Filled 2018-10-26 (×4): qty 2

## 2018-10-26 MED ORDER — DOCUSATE SODIUM 100 MG PO CAPS
100.0000 mg | ORAL_CAPSULE | Freq: Two times a day (BID) | ORAL | Status: DC
  Administered 2018-10-26 – 2018-10-27 (×3): 100 mg via ORAL
  Filled 2018-10-26 (×3): qty 1

## 2018-10-26 MED ORDER — HEPARIN (PORCINE) 25000 UT/250ML-% IV SOLN: 16.0000 [IU]/kg/h | INTRAVENOUS | Status: DC

## 2018-10-26 MED ORDER — BUDESONIDE 0.25 MG/2ML IN SUSP
0.2500 mg | Freq: Two times a day (BID) | RESPIRATORY_TRACT | Status: DC
  Administered 2018-10-26 – 2018-10-27 (×3): 0.25 mg via RESPIRATORY_TRACT
  Filled 2018-10-26 (×3): qty 2

## 2018-10-26 MED ORDER — FLUTICASONE PROPIONATE HFA 110 MCG/ACT IN AERO: 2.0000 | INHALATION_SPRAY | Freq: Two times a day (BID) | RESPIRATORY_TRACT | Status: DC

## 2018-10-26 MED ORDER — VITAMIN D (ERGOCALCIFEROL) 1.25 MG (50000 UNIT) PO CAPS
50000.0000 [IU] | ORAL_CAPSULE | ORAL | Status: DC
  Administered 2018-10-27: 50000 [IU] via ORAL
  Filled 2018-10-26: qty 1

## 2018-10-26 NOTE — Progress Notes (Signed)
Advanced care plan.  Purpose of the Encounter: CODE STATUS  Parties in Attendance: Patient  Patient's Decision Capacity: Good  Subjective/Patient's story: Presented to the emergency room for chest discomfort  Objective/Medical story Patient was evaluated with CTA chest which showed pulmonary embolism Needs IV heparin drip for anticoagulation Needs Doppler ultrasound of lower extremities to assess for DVT   Goals of care determination:  Advance care directives and goals of care discussed with the patient Anticoagulation medications were discussed Patient does not want any CPR, intubation ventilator if the need arises   CODE STATUS: DNR   Time spent discussing advanced care planning: 16 minutes

## 2018-10-26 NOTE — ED Notes (Signed)
Patient transported to CT 

## 2018-10-26 NOTE — Progress Notes (Signed)
ANTICOAGULATION CONSULT NOTE - Initial Consult  Pharmacy Consult for Apixaban  Indication: pulmonary embolus  Allergies  Allergen Reactions  . Sulfonamide Derivatives Anaphylaxis  . Topamax [Topiramate] Other (See Comments)    Metabolic acidosis   . Biaxin [Clarithromycin] Other (See Comments)    pericarditis  . Hydrocodone Other (See Comments)    "hyper and climbing the walls"  . Motrin [Ibuprofen] Other (See Comments)    headaches  . Symbicort [Budesonide-Formoterol Fumarate] Other (See Comments)    02/07/15 tremor  . Percocet [Oxycodone-Acetaminophen] Itching and Rash  . Tape Itching and Rash    Use paper tape only  . Tetracyclines & Related Other (See Comments)    "immediate yeast infection"    Patient Measurements: Height: 5\' 2"  (157.5 cm) Weight: 138 lb (62.6 kg) IBW/kg (Calculated) : 50.1 Heparin Dosing Weight: 32 kg  Vital Signs: Temp: 98.1 F (36.7 C) (12/01 0737) Temp Source: Oral (12/01 0737) BP: 143/88 (12/01 0930) Pulse Rate: 68 (12/01 0930)  Labs: Recent Labs    10/25/18 2225 10/26/18 0148  HGB 12.6  --   HCT 40.7  --   PLT 194  --   APTT 36  --   LABPROT 13.1  --   INR 1.00  --   CREATININE 0.81  --   TROPONINI <0.03 <0.03    Estimated Creatinine Clearance: 52.2 mL/min (by C-G formula based on SCr of 0.81 mg/dL).   Medical History: Past Medical History:  Diagnosis Date  . 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 Aortic root aneurysm ([place])   . Arthritis   . Asthma   . Breast cancer ([place]) 2002   infiltrative ductal carcinoma   . Cataract 2019  . COPD (chronic obstructive pulmonary disease) ([place]) 9/08  . Ductal carcinoma of breast, estrogen receptor positive, stage 1 ([place]) 09/16/2012  . Endometriosis   . GERD (gastroesophageal reflux disease)   . Hypercalcemia 09/14/2013  . Hypertension   . Lesion of breast 1992   right, benign  . Lung disease    secondary to [name] infection  . Mastalgia 7/95  .  Osteoporosis, post-menopausal 03/14/2012  . Ovarian cancer ([place]) 2004  . Pancreatitis    secondary to Cholelithiasis  . Parkinson disease ([place]) 02/10/15  . Post-thoracotomy pain syndrome   . Pseudomonas pneumonia ([place]) 5/09  . Uterine fibroid   . Vitamin D deficiency     Medications:  No anticoag in PTA meds   Assessment: Pharmacy consulted for apixaban (Eliquis) dosing in 77 yo female admitted with PE.  Patient was started on heparin drip in ED. Per consult, pharmacy is to discontinue heparin drip and start apixaban.    Plan:  Start apixaban 10mg  twice daily x 7 days, followed apixaban 5mg  twice daily thereafter.   Heparin drip to be discontinued just prior to 1st dose of apixaban 10mg .   Pernell Dupre, PharmD, BCPS Clinical Pharmacist 10/26/2018 9:43 AM

## 2018-10-26 NOTE — NC FL2 (Addendum)
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 Chest pain 10/26/2018  . Orthostatic hypotension 08/25/2018  . Left patella fracture 04/25/2018  . Right ankle injury, initial encounter 04/25/2018  . Renal cyst, acquired, right 02/02/2018  . Advanced care planning/counseling discussion 01/26/2018  . Liver lesion 01/21/2018  . Low serum vitamin B12 01/21/2018  . Right-sided chest wall pain 01/21/2018  . Chronic fatigue 10/23/2017  . Lesion of left lung 10/23/2017  . Genetic testing 05/01/2017  . Encounter for general adult medical examination with abnormal findings 12/27/2016  . Parkinson disease ([place]) 01/10/2016  . DNR (do not resuscitate) 05/22/2015  . Tremor 02/07/2015  . Dysphagia, pharyngoesophageal phase 02/07/2015  . Hypercalcemia 09/14/2013  . Unspecified vitamin D deficiency 04/27/2013  . Breast cancer of upper-outer quadrant of left female breast ([place]) 09/16/2012  . Osteoporosis, post-menopausal 03/14/2012  . Aortic aneurysm ([place]) 01/01/2011  . OSTEOARTHRITIS, CERVICAL SPINE 08/15/2009  . ALLERGIC RHINITIS 03/25/2008  . Chronic obstructive airway disease with asthma ([place]) 03/25/2008  . BREAST CANCER, HX OF 03/10/2008  . Mixed hyperlipidemia 02/10/2007  . Essential hypertension  02/10/2007    Orientation RESPIRATION BLADDER Height & Weight     Self, Time, Place, Situation  Normal(Complaints of SOB) Continent Weight: 138 lb (62.6 kg) Height:  5\' 2"  (157.5 cm)  BEHAVIORAL SYMPTOMS/MOOD NEUROLOGICAL BOWEL NUTRITION STATUS      Continent    AMBULATORY STATUS COMMUNICATION OF NEEDS Skin   Independent Verbally Normal                       Personal Care Assistance Level of Assistance  Bathing, Feeding, Dressing, Total care Bathing Assistance: Independent Feeding assistance: Independent Dressing Assistance: Independent Total Care Assistance: Independent   Functional Limitations Info  Sight, Hearing, Speech Sight Info: Adequate(wears glasses) Hearing Info: Adequate Speech Info: Adequate    SPECIAL CARE FACTORS FREQUENCY                       Contractures Contractures Info: Not present    Additional Factors Info  Code Status, Allergies Code Status Info: DNR Allergies Info:  Sulfonamide Derivatives, Topamax Topiramate, Biaxin Clarithromycin, Hydrocodone, Motrin Ibuprofen, Symbicort Budesonide-formoterol Fumarate, Percocet Oxycodone-acetaminophen, Tape, Tetracyclines & Related           Current Medications (10/26/2018):  This is the current hospital active medication list Current Facility-Administered Medications  Medication Dose Route Frequency Provider Last Rate Last Dose  . acetaminophen (TYLENOL) tablet 650 mg  650 mg Oral Q6H PRN Harrie Foreman, MD       Or  . acetaminophen (TYLENOL) suppository 650 mg  650 mg Rectal Q6H PRN Harrie Foreman, MD      . albuterol (PROVENTIL) (2.5 MG/3ML) 0.083% nebulizer solution 2.5 mg  2.5 mg  Nebulization Q4H PRN Harrie Foreman, MD      . apixaban Arne Cleveland) tablet 10 mg  10 mg Oral BID Pernell Dupre, RPH   10 mg at 10/26/18 1236   Followed by  . [START ON 11/02/2018] apixaban (ELIQUIS) tablet 5 mg  5 mg Oral BID Hallaji, Dani Gobble, RPH      . aspirin chewable tablet 81 mg  81 mg Oral Daily  Harrie Foreman, MD   81 mg at 10/26/18 1238  . bisoprolol (ZEBETA) tablet 5 mg  5 mg Oral Daily Harrie Foreman, MD   5 mg at 10/26/18 1242  . budesonide (PULMICORT) nebulizer solution 0.25 mg  0.25 mg Nebulization BID Harrie Foreman, MD   0.25 mg at 10/26/18 9150  . carbidopa-levodopa ([name]) 25-100 MG per tablet immediate release 1 tablet  1 tablet Oral QPM Harrie Foreman, MD      . carbidopa-levodopa ([name]) 25-100 MG per tablet immediate release 1 tablet  1 tablet Oral Q1400 Harrie Foreman, MD      . carbidopa-levodopa ([name]) 25-100 MG per tablet immediate release 2 tablet  2 tablet Oral Edwinna Areola, MD   2 tablet at 10/26/18 [id number]  . clonazePAM (KLONOPIN) tablet 0.25 mg  0.25 mg Oral QHS Harrie Foreman, MD      . docusate sodium (COLACE) capsule 100 mg  100 mg Oral BID Harrie Foreman, MD   100 mg at 10/26/18 1244  . gabapentin ([name]) capsule 600 mg  600 mg Oral TID Harrie Foreman, MD   600 mg at 10/26/18 1236  . ondansetron (ZOFRAN) tablet 4 mg  4 mg Oral Q6H PRN Harrie Foreman, MD       Or  . ondansetron [facility]) injection 4 mg  4 mg Intravenous Q6H PRN Harrie Foreman, MD      . pantoprazole (PROTONIX) EC tablet 40 mg  40 mg Oral Daily Harrie Foreman, MD   40 mg at 10/26/18 1237  . tiotropium (SPIRIVA) inhalation capsule ([facility] use ONLY) 18 mcg  1 capsule Inhalation Daily Harrie Foreman, MD   18 mcg at 10/26/18 1244  . [START ON 10/27/2018] Vitamin D (Ergocalciferol) ([name]) capsule 50,000 Units  50,000 Units Oral Weekly Harrie Foreman, MD         Discharge Medications: Please see discharge summary for a list of discharge medications.  Relevant Imaging Results:  Relevant Lab Results:   Additional Information [id number]  [name], [place]

## 2018-10-26 NOTE — ED Notes (Signed)
Given breakfast tray. 

## 2018-10-26 NOTE — Progress Notes (Signed)
ANTICOAGULATION CONSULT NOTE - Initial Consult  Pharmacy Consult for heparin drip Indication: pulmonary embolus  Allergies  Allergen Reactions  . Sulfonamide Derivatives Anaphylaxis  . Topamax [Topiramate] Other (See Comments)    Metabolic acidosis   . Biaxin [Clarithromycin] Other (See Comments)    pericarditis  . Hydrocodone Other (See Comments)    "hyper and climbing the walls"  . Motrin [Ibuprofen] Other (See Comments)    headaches  . Symbicort [Budesonide-Formoterol Fumarate]     02/07/15 tremor  . Percocet [Oxycodone-Acetaminophen] Itching and Rash  . Tape Itching and Rash    Use paper tape only  . Tetracyclines & Related Other (See Comments)    "immediate yeast infection"    Patient Measurements: Height: 5\' 2"  (157.5 cm) Weight: 138 lb (62.6 kg) IBW/kg (Calculated) : 50.1 Heparin Dosing Weight: 32 kg  Vital Signs: Temp: 98.7 F (37.1 C) (11/30 2229) Temp Source: Oral (11/30 2229) BP: 154/87 (12/01 0000) Pulse Rate: 59 (12/01 0000)  Labs: Recent Labs    10/25/18 2225  HGB 12.6  HCT 40.7  PLT 194  CREATININE 0.81  TROPONINI <0.03    Estimated Creatinine Clearance: 52.2 mL/min (by C-G formula based on SCr of 0.81 mg/dL).   Medical History: Past Medical History:  Diagnosis Date  . 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 Arthritis   . Asthma   . Breast cancer ([place]) 2002   infiltrative ductal carcinoma   . Cataract 2019  . COPD (chronic obstructive pulmonary disease) ([place]) 9/08  . Ductal carcinoma of breast, estrogen receptor positive, stage 1 ([place]) 09/16/2012  . Endometriosis   . GERD (gastroesophageal reflux disease)   . Hypercalcemia 09/14/2013  . Hypertension   . Lesion of breast 1992   right, benign  . Lung disease    secondary to [name] infection  . Mastalgia 7/95  . Osteoporosis, post-menopausal 03/14/2012  . Ovarian cancer ([place]) 2004  . Pancreatitis    secondary to Cholelithiasis   . Parkinson disease ([place]) 02/10/15  . Post-thoracotomy pain syndrome   . Pseudomonas pneumonia ([place]) 5/09  . Uterine fibroid   . Vitamin D deficiency     Medications:  No anticoag in PTA meds   Assessment:  Goal of Therapy:  Heparin level 0.3-0.7 units/ml Monitor platelets by anticoagulation protocol: Yes   Plan:  3750 bolus per MD and initial rate of 1100 units/hr. First heparin level 8 hours after start of infusion.  Yony Roulston S 10/26/2018,1:12 AM

## 2018-10-26 NOTE — ED Notes (Signed)
Pt ambulatory to toilet, dyspnea with exertion

## 2018-10-26 NOTE — H&P (Signed)
Crystal Bass is an 77 y.o. female.   Chief Complaint: Chest pain HPI: The patient with past medical history of MAC infection, COPD, hypertension and Parkinson disease presents to the emergency department from assisted living due to chest pain.  The patient states her chest pain was gradual in onset.  She reports that it has made her weak to the point that she needed to lean against the wall to walk around her room and retrieve her walker.  She also reports feeling a knot in her right leg similar to a "marble" that she felt earlier today.  Initial work-up in the emergency department was negative but the patient continued to have pain with deep inspiration which prompted CTA of her chest that revealed pulmonary embolism as well as increased groundglass opacities from prior studies.  The patient was started on a heparin drip prior to the emergency department staff calling the hospitalist service for admission.  Past Medical History:  Diagnosis Date  . 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 Arthritis   . Asthma   . Breast cancer ([place]) 2002   infiltrative ductal carcinoma   . Cataract 2019  . COPD (chronic obstructive pulmonary disease) ([place]) 9/08  . Ductal carcinoma of breast, estrogen receptor positive, stage 1 ([place]) 09/16/2012  . Endometriosis   . GERD (gastroesophageal reflux disease)   . Hypercalcemia 09/14/2013  . Hypertension   . Lesion of breast 1992   right, benign  . Lung disease    secondary to [name] infection  . Mastalgia 7/95  . Osteoporosis, post-menopausal 03/14/2012  . Ovarian cancer ([place]) 2004  . Pancreatitis    secondary to Cholelithiasis  . Parkinson disease ([place]) 02/10/15  . Post-thoracotomy pain syndrome   . Pseudomonas pneumonia ([place]) 5/09  . Uterine fibroid   . Vitamin D deficiency     Past Surgical History:  Procedure Laterality Date  . 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 APPENDECTOMY  2004  . BREAST BIOPSY  08/2004   right breast-benign  . BREAST LUMPECTOMY  1992   benign  . CARDIAC CATHETERIZATION  2007   essentially negation for significant CAD  . CHOLECYSTECTOMY  2001  . COLONOSCOPY  2014   Dr [name] , due 2019  . LUNG BIOPSY  2002   [name], Dr [name]  . MASTECTOMY MODIFIED RADICAL Left 2002   oral chemotheraphy (tamoxifen then Armidex) no radiation, Dr.[name]  . TONSILLECTOMY  1946    Family History  Problem Relation Age of Onset  . Lung cancer Father 10       d.82 history of smoking  . Breast cancer Sister 81  . Colon cancer Sister   . Emphysema Mother        d.64 was never a smoker  . Stroke Paternal Aunt        in mid 65s  . Osteoporosis Paternal Aunt   . Melanoma Maternal Aunt   . Cancer Maternal Uncle        unspecified type  . Cancer Maternal Grandmother        d.82s unspecified GI cancer  . Cancer Maternal Grandfather        d.62s unspecified type  . Liver cancer Maternal Aunt   . Cancer Maternal Uncle        unspecified type  . Cancer Maternal Uncle  unspecified type  . Myasthenia gravis Paternal Aunt   . Breast cancer Cousin        d.60s-daughter of unaffected paternal aunt Eustace Moore  . Colon cancer Cousin 13       d.70-daughter of unaffected maternal aunt Leila  . Lung cancer Cousin 70       d.70-sisters to each other, both daughters of maternal uncle Johnny  . Diabetes Neg Hx   . Heart disease Neg Hx   . Stomach cancer Neg Hx   . Ulcerative colitis Neg Hx    Social History:  reports that she has never smoked. She has never used smokeless tobacco. She reports that she does not drink alcohol or use drugs.  Allergies:  Allergies  Allergen Reactions  . Sulfonamide Derivatives Anaphylaxis  . Topamax [Topiramate] Other (See Comments)    Metabolic acidosis   . Biaxin [Clarithromycin] Other (See Comments)    pericarditis  . Hydrocodone Other (See Comments)    "hyper and climbing the walls"   . Motrin [Ibuprofen] Other (See Comments)    headaches  . Symbicort [Budesonide-Formoterol Fumarate] Other (See Comments)    02/07/15 tremor  . Percocet [Oxycodone-Acetaminophen] Itching and Rash  . Tape Itching and Rash    Use paper tape only  . Tetracyclines & Related Other (See Comments)    "immediate yeast infection"     (Not in a hospital admission)  Results for orders placed or performed during the hospital encounter of 10/25/18 (from the past 48 hour(s))  Basic metabolic panel     Status: Abnormal   Collection Time: 10/25/18 10:25 PM  Result Value Ref Range   Sodium 140 135 - 145 mmol/L   Potassium 3.6 3.5 - 5.1 mmol/L   Chloride 103 98 - 111 mmol/L   CO2 28 22 - 32 mmol/L   Glucose, Bld 106 (H) 70 - 99 mg/dL   BUN 19 8 - 23 mg/dL   Creatinine, Ser 0.81 0.44 - 1.00 mg/dL   Calcium 8.7 (L) 8.9 - 10.3 mg/dL   GFR calc non Af Amer >60 >60 mL/min   GFR calc Af Amer >60 >60 mL/min   Anion gap 9 5 - 15    Comment: Performed at [facility], [address] [place], [place] [address]  CBC     Status: None   Collection Time: 10/25/18 10:25 PM  Result Value Ref Range   WBC 9.5 4.0 - 10.5 K/uL   RBC 4.11 3.87 - 5.11 MIL/uL   Hemoglobin 12.6 12.0 - 15.0 g/dL   HCT 40.7 36.0 - 46.0 %   MCV 99.0 80.0 - 100.0 fL   MCH 30.7 26.0 - 34.0 pg   MCHC 31.0 30.0 - 36.0 g/dL   RDW 13.6 11.5 - 15.5 %   Platelets 194 150 - 400 K/uL   nRBC 0.0 0.0 - 0.2 %    Comment: Performed at [facility], [address] [place], [place] [address]  Troponin I - ONCE - STAT     Status: None   Collection Time: 10/25/18 10:25 PM  Result Value Ref Range   Troponin I <0.03 <0.03 ng/mL    Comment: Performed at [facility], [address] [place], [place] [address]  Hepatic function panel     Status: Abnormal   Collection Time: 10/25/18 10:25 PM  Result Value Ref Range   Total Protein 6.6 6.5 - 8.1 g/dL   Albumin 3.5 3.5 - 5.0 g/dL   AST 18 15 -  41 U/L   ALT 10 0 -  44 U/L   Alkaline Phosphatase 83 38 - 126 U/L   Total Bilirubin 1.2 0.3 - 1.2 mg/dL   Bilirubin, Direct 0.2 0.0 - 0.2 mg/dL   Indirect Bilirubin 1.0 (H) 0.3 - 0.9 mg/dL    Comment: Performed at [facility], [address] [place], [place] [address]  Lipase, blood     Status: None   Collection Time: 10/25/18 10:25 PM  Result Value Ref Range   Lipase 31 11 - 51 U/L    Comment: Performed at [facility], [address] [place], [place] [address]  APTT     Status: None   Collection Time: 10/25/18 10:25 PM  Result Value Ref Range   aPTT 36 24 - 36 seconds    Comment: Performed at [facility], [address] [place], [place] [address]  Protime-INR     Status: None   Collection Time: 10/25/18 10:25 PM  Result Value Ref Range   Prothrombin Time 13.1 11.4 - 15.2 seconds   INR 1.00     Comment: Performed at [facility], [address] [place], [place] [address]  Troponin I - Once-Timed     Status: None   Collection Time: 10/26/18  1:48 AM  Result Value Ref Range   Troponin I <0.03 <0.03 ng/mL    Comment: Performed at [facility], [address] [place], [place] [address]  Lactic acid, plasma     Status: None   Collection Time: 10/26/18  1:48 AM  Result Value Ref Range   Lactic Acid, Venous 1.4 0.5 - 1.9 mmol/L    Comment: Performed at [facility], [address], [place], [place] [address]   Dg Chest 2 View  Result Date: 10/25/2018 CLINICAL DATA:  Midsternal chest pain. EXAM: CHEST - 2 VIEW COMPARISON:  August 21, 2017 FINDINGS: No pneumothorax. Cardiomegaly. The hila and mediastinum are unremarkable. Coarsened lung markings. No focal infiltrate. No other acute abnormalities. IMPRESSION: Bilateral coarsened lung markings may represent bronchitic change versus atypical infection. No other interval change. Electronically Signed   By: Dorise Bullion III M.D   On: 10/25/2018 23:07   Ct Angio Chest Pe W/cm &/or Wo  Cm  Result Date: 10/26/2018 CLINICAL DATA:  Mid sternal chest pain radiating to the back beginning at 7 p.m. last night. EXAM: CT ANGIOGRAPHY CHEST WITH CONTRAST TECHNIQUE: Multidetector CT imaging of the chest was performed using the standard protocol during bolus administration of intravenous contrast. Multiplanar CT image reconstructions and MIPs were obtained to evaluate the vascular anatomy. CONTRAST:  65m OMNIPAQUE IOHEXOL 350 MG/ML SOLN COMPARISON:  Chest radiograph, 10/25/2018.  Chest CT, 08/21/2018. FINDINGS: Cardiovascular: There is satisfactory opacification of the pulmonary arteries to the segmental level. There is a single small nonocclusive pulmonary embolus in a left upper lobe, anterior segmental branch. No other pulmonary emboli. Heart is mildly enlarged. No pericardial effusion. Minor left coronary artery calcifications. Mild aortic atherosclerosis. Ascending thoracic aorta measures a maximum of 3.9 cm. Mediastinum/Nodes: No neck base, axillary, mediastinal or hilar masses or pathologically enlarged lymph nodes. Lungs/Pleura: There lung nodules and area bronchiectasis with mucous plugging. Bronchiectasis with mucous plugging has increased when compared to the prior CT, most evident in the left lower lobe. There are additional nodules, an 8 mm nodule in the right upper lobe, image 32, series 6 new since the prior CT. Irregular nodule in the right lower lobe, image 47, measures 11 mm, stable from  the prior CT. There also bronchial wall thickening with bronchial narrowing is noted to the superior segment of the left lower lobe increased prior CT. Focal consolidation is noted in the anteromedial left upper lobe centered on image 39, and base of the right middle lobe, new from prior CT. There also areas ground-glass opacity in both lungs. No pleural effusion. No pneumothorax. Stable pulmonary anastomosis staples in the right upper lobe. Upper Abdomen: No acute findings. Musculoskeletal: Levoscoliosis  of the upper thoracic spine. No fracture or acute finding. No osteoblastic or osteolytic lesions. Review of the MIP images confirms the above findings. IMPRESSION: 1. Single small pulmonary embolism noted in an apical segmental branch to the left upper lobe. No other evidence of a pulmonary embolism. 2. Worsened lung aeration compared to the prior CT. Lung abnormalities are predominantly areas of bronchiectasis mucous plugging with areas tree-in-bud type opacities. Also lung nodules as described, areas of ground-glass opacity and areas of consolidation, the latter are noted in the left upper lobe and base of the right middle lobe. Overall, findings are consistent progression atypical infection such as [name] or noninfectious inflammatory process. Aortic Atherosclerosis (ICD10-I70.0). Electronically Signed   By: Lajean Manes M.D.   On: 10/26/2018 00:41    Review of Systems  Constitutional: Negative for chills and fever.  HENT: Negative for sore throat and tinnitus.   Eyes: Negative for blurred vision and redness.  Respiratory: Negative for cough and shortness of breath.   Cardiovascular: Positive for chest pain. Negative for palpitations, orthopnea and PND.  Gastrointestinal: Negative for abdominal pain, diarrhea, nausea and vomiting.  Genitourinary: Negative for dysuria, frequency and urgency.  Musculoskeletal: Negative for joint pain and myalgias.  Skin: Negative for rash.       No lesions  Neurological: Negative for speech change, focal weakness and weakness.  Endo/Heme/Allergies: Does not bruise/bleed easily.       No temperature intolerance  Psychiatric/Behavioral: Negative for depression and suicidal ideas.    Blood pressure (!) 183/91, pulse 72, temperature 98.7 F (37.1 C), temperature source Oral, resp. rate (!) 21, height _0  (1.575 m), weight 62.6 kg, SpO2 95 %. Physical Exam  Vitals reviewed. Constitutional: She is oriented to person, place, and time. She appears well-developed and  well-nourished. No distress.  HENT:  Head: Normocephalic and atraumatic.  Mouth/Throat: Oropharynx is clear and moist.  Eyes: Pupils are equal, round, and reactive to light. Conjunctivae and EOM are normal. No scleral icterus.  Neck: Normal range of motion. Neck supple. No JVD present. No tracheal deviation present. No thyromegaly present.  Cardiovascular: Normal rate, regular rhythm and normal heart sounds. Exam reveals no gallop and no friction rub.  No murmur heard. Respiratory: Effort normal and breath sounds normal.  GI: Soft. Bowel sounds are normal. She exhibits no distension. There is no tenderness.  Genitourinary:  Genitourinary Comments: Deferred  Musculoskeletal: Normal range of motion. She exhibits no edema.  Lymphadenopathy:    She has no cervical adenopathy.  Neurological: She is alert and oriented to person, place, and time. No cranial nerve deficit. She exhibits normal muscle tone.  Skin: Skin is warm and dry. No erythema.  Psychiatric: She has a normal mood and affect. Her behavior is normal. Judgment and thought content normal.     Assessment/Plan This is a 77 year old female admitted for chest pain. 1.  Chest pain: Atypical; pleuritic.  Secondary to PE.  Troponin is negative so far.  Continue to follow cardiac biomarkers.  Monitor telemetry.  Consult cardiology at  the discretion of the primary team. 2.  Pulmonary embolism: Patient is currently on heparin drip.  She can be transitioned to an oral anticoagulant prior to discharge today.  Her clot is apical and peripheral and there does not appear to be any right heart strain. 3.  Hypertension: Uncontrolled; the patient has missed some of her home meds.  Resume antihypertensive medication. 4.  MAC infection: Chronic; continue azithromycin 5.  DVT prophylaxis: Full dose anticoagulation 6.  GI prophylaxis: Pantoprazole per home regimen The patient is a DNR.  Time spent on admission orders and patient care approximately 45  minutes.  Harrie Foreman, MD 10/26/2018, 7:14 AM

## 2018-10-26 NOTE — Progress Notes (Signed)
LCSW attempted to meet with patient but RN stated this was not a good time and to come back later. Assessment started    [employer] LCSW [phone]

## 2018-10-26 NOTE — ED Notes (Signed)
Pt ambulatory to toilet, given snacks and waiting for room on tele floor (currently full)

## 2018-10-26 NOTE — Clinical Social Work Note (Addendum)
Clinical Social Work Assessment  Patient Details  Name: Crystal Bass MRN: [id number] Date of Birth: [birth year]  Date of referral:  10/26/18               Reason for consult:  Other (Comment Required)(From [facility])                Permission sought to share information with:  Family Supports Permission granted to share information::  Yes, Verbal Permission Granted  Name::     Sister Modesto Charon [phone]  Brother Solon Augusta [phone]  Agency::  [facility]  Relationship::     Contact Information:     Housing/Transportation Living arrangements for the past 2 months:    Source of Information:  Patient Patient Interpreter Needed:  None Criminal Activity/Legal Involvement Pertinent to Current Situation/Hospitalization:  No - Comment as needed Significant Relationships:  Siblings Lives with:  Facility Resident Do you feel safe going back to the place where you live?  Yes Need for family participation in patient care:  Yes (Comment)  Care giving concerns: Patient will decide her plan of care in the next day either go with home health or    Social Worker assessment / plan:  LCSW attempted to meet with patient but Floor nurse Stanton Kidney was engaged with patient at the this time. Patient from [facility] and information collected from medical records. LCSW engaged with patient 77 year old female who is widowed no children  in the afternoon and received verbal consent to speak to facility administration at [facility]. Patient is Independent with all your ADL's just complaining of shortness of breath. Patient reports she has parkinsons disease. And oriented x4 She would prefer to go to [facility] for a week or so. She will follow the advice of doctor and PT.  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  Emotional Assessment Appearance:  Appears stated age Attitude/Demeanor/Rapport:    Affect (typically observed):  Unable to Assess Orientation:    Alcohol / Substance use:  Not Applicable Psych involvement (Current and /or in the community):     Discharge Needs  Concerns to be addressed:  No discharge needs identified Readmission within the last 30 days:  No Current discharge risk:  None Barriers to Discharge:  No Barriers Identified   [name], LCSW 10/26/2018, 10:50 AM

## 2018-10-26 NOTE — ED Notes (Signed)
Breakfast tray given. °

## 2018-10-26 NOTE — Progress Notes (Signed)
[facility] NAME: Crystal Bass    MR#:  [id number]  DATE OF BIRTH:  07-19-1941  SUBJECTIVE:  CHIEF COMPLAINT:   Chief Complaint  Patient presents with  . Chest Pain  Patient seen and evaluated today Decrease chest pain Shortness of breath present No fever  REVIEW OF SYSTEMS:    ROS  CONSTITUTIONAL: No documented fever. Has fatigue, weakness. No weight gain, no weight loss.  EYES: No blurry or double vision.  ENT: No tinnitus. No postnasal drip. No redness of the oropharynx.  RESPIRATORY: No cough, no wheeze, no hemoptysis. Has dyspnea.  CARDIOVASCULAR: mild chest pain. No orthopnea. No palpitations. No syncope.  GASTROINTESTINAL: No nausea, no vomiting or diarrhea. No abdominal pain. No melena or hematochezia.  GENITOURINARY: No dysuria or hematuria.  ENDOCRINE: No polyuria or nocturia. No heat or cold intolerance.  HEMATOLOGY: No anemia. No bruising. No bleeding.  INTEGUMENTARY: No rashes. No lesions.  MUSCULOSKELETAL: No arthritis. No swelling. No gout.  NEUROLOGIC: No numbness, tingling, or ataxia. No seizure-type activity.  PSYCHIATRIC: No anxiety. No insomnia. No ADD.   DRUG ALLERGIES:   Allergies  Allergen Reactions  . Sulfonamide Derivatives Anaphylaxis  . Topamax [Topiramate] Other (See Comments)    Metabolic acidosis   . Biaxin [Clarithromycin] Other (See Comments)    pericarditis  . Hydrocodone Other (See Comments)    "hyper and climbing the walls"  . Motrin [Ibuprofen] Other (See Comments)    headaches  . Symbicort [Budesonide-Formoterol Fumarate] Other (See Comments)    02/07/15 tremor  . Percocet [Oxycodone-Acetaminophen] Itching and Rash  . Tape Itching and Rash    Use paper tape only  . Tetracyclines & Related Other (See Comments)    "immediate yeast infection"    VITALS:  Blood pressure (!) 168/94, pulse 66, temperature (!) 97.5 F (36.4 C), temperature source Oral, resp. rate (!) 23, height  5' 2"  (1.575 m), weight 62.6 kg, SpO2 96 %.  PHYSICAL EXAMINATION:   Physical Exam  GENERAL:  77 y.o.-year-old patient lying in the bed with no acute distress.  EYES: Pupils equal, round, reactive to light and accommodation. No scleral icterus. Extraocular muscles intact.  HEENT: Head atraumatic, normocephalic. Oropharynx and nasopharynx clear.  NECK:  Supple, no jugular venous distention. No thyroid enlargement, no tenderness.  LUNGS: Normal breath sounds bilaterally, no wheezing, rales, rhonchi. No use of accessory muscles of respiration.  CARDIOVASCULAR: S1, S2 normal. No murmurs, rubs, or gallops.  ABDOMEN: Soft, nontender, nondistended. Bowel sounds present. No organomegaly or mass.  EXTREMITIES: No cyanosis, clubbing or edema b/l.    NEUROLOGIC: Cranial nerves II through XII are intact. No focal Motor or sensory deficits b/l.   PSYCHIATRIC: The patient is alert and oriented x 3.  SKIN: No obvious rash, lesion, or ulcer.   LABORATORY PANEL:   CBC Recent Labs  Lab 10/25/18 2225  WBC 9.5  HGB 12.6  HCT 40.7  PLT 194   ------------------------------------------------------------------------------------------------------------------ Chemistries  Recent Labs  Lab 10/25/18 2225  NA 140  K 3.6  CL 103  CO2 28  GLUCOSE 106*  BUN 19  CREATININE 0.81  CALCIUM 8.7*  AST 18  ALT 10  ALKPHOS 83  BILITOT 1.2   ------------------------------------------------------------------------------------------------------------------  Cardiac Enzymes Recent Labs  Lab 10/26/18 0148  TROPONINI <0.03   ------------------------------------------------------------------------------------------------------------------  RADIOLOGY:  Dg Chest 2 View  Result Date: 10/25/2018 CLINICAL DATA:  Midsternal chest pain. EXAM: CHEST - 2 VIEW COMPARISON:  August 21, 2017 FINDINGS:  No pneumothorax. Cardiomegaly. The hila and mediastinum are unremarkable. Coarsened lung markings. No focal  infiltrate. No other acute abnormalities. IMPRESSION: Bilateral coarsened lung markings may represent bronchitic change versus atypical infection. No other interval change. Electronically Signed   By: Dorise Bullion III M.D   On: 10/25/2018 23:07   Ct Angio Chest Pe W/cm &/or Wo Cm  Result Date: 10/26/2018 CLINICAL DATA:  Mid sternal chest pain radiating to the back beginning at 7 p.m. last night. EXAM: CT ANGIOGRAPHY CHEST WITH CONTRAST TECHNIQUE: Multidetector CT imaging of the chest was performed using the standard protocol during bolus administration of intravenous contrast. Multiplanar CT image reconstructions and MIPs were obtained to evaluate the vascular anatomy. CONTRAST:  56m OMNIPAQUE IOHEXOL 350 MG/ML SOLN COMPARISON:  Chest radiograph, 10/25/2018.  Chest CT, 08/21/2018. FINDINGS: Cardiovascular: There is satisfactory opacification of the pulmonary arteries to the segmental level. There is a single small nonocclusive pulmonary embolus in a left upper lobe, anterior segmental branch. No other pulmonary emboli. Heart is mildly enlarged. No pericardial effusion. Minor left coronary artery calcifications. Mild aortic atherosclerosis. Ascending thoracic aorta measures a maximum of 3.9 cm. Mediastinum/Nodes: No neck base, axillary, mediastinal or hilar masses or pathologically enlarged lymph nodes. Lungs/Pleura: There lung nodules and area bronchiectasis with mucous plugging. Bronchiectasis with mucous plugging has increased when compared to the prior CT, most evident in the left lower lobe. There are additional nodules, an 8 mm nodule in the right upper lobe, image 32, series 6 new since the prior CT. Irregular nodule in the right lower lobe, image 47, measures 11 mm, stable from the prior CT. There also bronchial wall thickening with bronchial narrowing is noted to the superior segment of the left lower lobe increased prior CT. Focal consolidation is noted in the anteromedial left upper lobe centered  on image 39, and base of the right middle lobe, new from prior CT. There also areas ground-glass opacity in both lungs. No pleural effusion. No pneumothorax. Stable pulmonary anastomosis staples in the right upper lobe. Upper Abdomen: No acute findings. Musculoskeletal: Levoscoliosis of the upper thoracic spine. No fracture or acute finding. No osteoblastic or osteolytic lesions. Review of the MIP images confirms the above findings. IMPRESSION: 1. Single small pulmonary embolism noted in an apical segmental branch to the left upper lobe. No other evidence of a pulmonary embolism. 2. Worsened lung aeration compared to the prior CT. Lung abnormalities are predominantly areas of bronchiectasis mucous plugging with areas tree-in-bud type opacities. Also lung nodules as described, areas of ground-glass opacity and areas of consolidation, the latter are noted in the left upper lobe and base of the right middle lobe. Overall, findings are consistent progression atypical infection such as [name] or noninfectious inflammatory process. Aortic Atherosclerosis (ICD10-I70.0). Electronically Signed   By: DLajean ManesM.D.   On: 10/26/2018 00:41     ASSESSMENT AND PLAN:  77year old female patient currently under hospitalist service for chest pain  1.  Atypical chest pain appears pleuritic.  Secondary to PE.  Troponin is negative so far.   Pain secondary to pulmonary embolism  2.  Pulmonary embolism: Patient is currently on heparin drip.   Pharmacy consult for patient to be transition to oral anticoagulation with Eliquis Doppler venous ultrasound of lower extremities to assess for any DVT  3.  Hypertension: Uncontrolled; the patient has missed some of her home meds.  Resume antihypertensive medication.  4.  MAC infection: Chronic; continue azithromycin antibiotic  5.  DVT prophylaxis: Full dose anticoagulation with  heparin drip  6.  GI prophylaxis: Pantoprazole per home regimen   All the records are reviewed  and case discussed with Care Management/Social Worker. Management plans discussed with the patient, family and they are in agreement.  CODE STATUS: DNR  DVT Prophylaxis: SCDs  TOTAL TIME TAKING CARE OF THIS PATIENT: 45 minutes.   POSSIBLE D/C IN 1 to 2 DAYS, DEPENDING ON CLINICAL CONDITION.  Saundra Shelling M.D on 10/26/2018 at 11:29 AM  Between 7am to 6pm - Pager - [phone]  After 6pm go to [email or web address] - password [email or web address] [facility]  Office  [phone]  CC: Primary care physician; [name], MD  Note: This dictation was prepared with Dragon dictation along with smaller phrase technology. Any transcriptional errors that result from this process are unintentional.

## 2018-10-26 NOTE — Care Management Obs Status (Signed)
[facility] NOTIFICATION   Patient Details  Name: Crystal Bass MRN: [id number] Date of Birth: 08/27/1941   Medicare Observation Status Notification Given:  Yes    Jakwon Gayton A Jerusalem Brownstein, RN 10/26/2018, 2:13 PM

## 2018-10-27 MED ORDER — LEVOFLOXACIN 500 MG PO TABS: 500.0000 mg | ORAL_TABLET | Freq: Every day | ORAL | 0 refills | Status: DC

## 2018-10-27 MED ORDER — APIXABAN 5 MG PO TABS: 10.0000 mg | ORAL_TABLET | Freq: Two times a day (BID) | ORAL | 0 refills | Status: DC

## 2018-10-27 MED ORDER — APIXABAN 5 MG PO TABS: 5.0000 mg | ORAL_TABLET | Freq: Two times a day (BID) | ORAL | 1 refills | Status: DC

## 2018-10-27 NOTE — Clinical Social Work Note (Signed)
CSW spoke with Seth Bake at [facility] and she reports that patient is not from [facility] but from [facility]. Patient will return to her Independent living facility at discharge. CSW signing off.   [place], [place]

## 2018-10-27 NOTE — Discharge Summary (Signed)
[facility] NAME: Crystal Bass    MR#:  [id number]  DATE OF BIRTH:  1941/09/19  DATE OF ADMISSION:  10/25/2018 ADMITTING PHYSICIAN: Harrie Foreman, MD  DATE OF DISCHARGE: 10/27/2018  PRIMARY CARE PHYSICIAN: [name], MD   ADMISSION DIAGNOSIS:  PE (pulmonary thromboembolism) ([place]) [I26.99] DVT (deep venous thrombosis) (HCC) [I82.409] Atypical mycobacterial infection of lung (HCC) [A31.0] Nonspecific chest pain [R07.9] Chest pain [R07.9]  DISCHARGE DIAGNOSIS:  Atypical chest pain Acute pulmonary volume Atypical mycobacterial infection of the lung  SECONDARY DIAGNOSIS:   Past Medical History:  Diagnosis Date  . 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 Aortic root aneurysm ([place])   . Arthritis   . Asthma   . Breast cancer ([place]) 2002   infiltrative ductal carcinoma   . Cataract 2019  . COPD (chronic obstructive pulmonary disease) ([place]) 9/08  . Ductal carcinoma of breast, estrogen receptor positive, stage 1 ([place]) 09/16/2012  . Endometriosis   . GERD (gastroesophageal reflux disease)   . Hypercalcemia 09/14/2013  . Hypertension   . Lesion of breast 1992   right, benign  . Lung disease    secondary to [name] infection  . Mastalgia 7/95  . Osteoporosis, post-menopausal 03/14/2012  . Ovarian cancer ([place]) 2004  . Pancreatitis    secondary to Cholelithiasis  . Parkinson disease ([place]) 02/10/15  . Post-thoracotomy pain syndrome   . Pseudomonas pneumonia ([place]) 5/09  . Uterine fibroid   . Vitamin D deficiency      ADMITTING HISTORY The patient with past medical history of MAC infection, COPD, hypertension and Parkinson disease presents to the emergency department from assisted living due to chest pain.  The patient states her chest pain was gradual in onset.  She reports that it has made her weak to the point that she needed to lean against the wall to walk around her room and retrieve her  walker.  She also reports feeling a knot in her right leg similar to a "marble" that she felt earlier today.  Initial work-up in the emergency department was negative but the patient continued to have pain with deep inspiration which prompted CTA of her chest that revealed pulmonary embolism as well as increased groundglass opacities from prior studies.  The patient was started on a heparin drip prior to the emergency department staff calling the hospitalist service for admission.  HOSPITAL COURSE:  Restarted on anticoagulation with IV heparin drip.  She was later transitioned to oral Eliquis after discussing with pharmacy.  Shortness of breath and chest pain resolved.  Troponins were negative.  Patient received antibiotics for atypical mycobacterial infection.  Patient was worked up with CTA chest and venous Doppler ultrasound of lower extremities there were.  Doppler ultrasound of lower extremities showed no DVT. Patient lives at an independent assisted living facility.  She will be discharged to the facility on oral Eliquis and follow-up with primary care physician in the clinic.  CONSULTS OBTAINED:  None  DRUG ALLERGIES:   Allergies  Allergen Reactions  . Sulfonamide Derivatives Anaphylaxis  . Topamax [Topiramate] Other (See Comments)    Metabolic acidosis   . Biaxin [Clarithromycin] Other (See Comments)    pericarditis  . Hydrocodone Other (See Comments)    "hyper and climbing the walls"  . Motrin [Ibuprofen] Other (See Comments)    headaches  . Symbicort [Budesonide-Formoterol Fumarate] Other (See Comments)    02/07/15 tremor  .  Percocet [Oxycodone-Acetaminophen] Itching and Rash  . Tape Itching and Rash    Use paper tape only  . Tetracyclines & Related Other (See Comments)    "immediate yeast infection"    DISCHARGE MEDICATIONS:   Allergies as of 10/27/2018      Reactions   Sulfonamide Derivatives Anaphylaxis   Topamax [topiramate] Other (See Comments)   Metabolic acidosis     Biaxin [clarithromycin] Other (See Comments)   pericarditis   Hydrocodone Other (See Comments)   "hyper and climbing the walls"   Motrin [ibuprofen] Other (See Comments)   headaches   Symbicort [budesonide-formoterol Fumarate] Other (See Comments)   02/07/15 tremor   Percocet [oxycodone-acetaminophen] Itching, Rash   Tape Itching, Rash   Use paper tape only   Tetracyclines & Related Other (See Comments)   "immediate yeast infection"      Medication List    TAKE these medications   acetaminophen 500 MG tablet Commonly known as:  TYLENOL Take 2 tablets (1,000 mg total) by mouth 2 (two) times daily as needed.   apixaban 5 MG Tabs tablet Commonly known as:  ELIQUIS Take 2 tablets (10 mg total) by mouth 2 (two) times daily for 6 days. Take from dec 2 nd to dec 7th   apixaban 5 MG Tabs tablet Commonly known as:  ELIQUIS Take 1 tablet (5 mg total) by mouth 2 (two) times daily. Start taking on:  11/02/2018   aspirin 81 MG tablet Take 81 mg by mouth daily.   bisoprolol 5 MG tablet Commonly known as:  ZEBETA Take 1 tablet (5 mg total) by mouth daily.   carbidopa-levodopa 25-100 MG tablet Commonly known as:  [name] TAKE 2 TABLETS IN THE MORNING, 1 TABLET IN THE AFTERNOON, 1 IN THE EVENING What changed:    See the new instructions.  Another medication with the same name was removed. Continue taking this medication, and follow the directions you see here.   clonazePAM 0.5 MG tablet Commonly known as:  KLONOPIN TAKE 1/2 TABLET BY MOUTH EVERY DAY AT BEDTIME   fluticasone 110 MCG/ACT inhaler Commonly known as:  [name] Inhale 2 puffs into the lungs 2 (two) times daily.   gabapentin 300 MG capsule Commonly known as:  [name] Take 2 capsules (600 mg total) by mouth 3 (three) times daily.   levofloxacin 500 MG tablet Commonly known as:  LEVAQUIN Take 1 tablet (500 mg total) by mouth daily for 5 days.   omeprazole 20 MG capsule Commonly known as:  PRILOSEC Take  1 capsule (20 mg total) by mouth daily.   [name] 456 (90 Base) MCG/ACT Aepb Generic drug:  Albuterol Sulfate Inhale 2 puffs into the lungs every 6 (six) hours as needed (dyspnea, wheezing).   Tiotropium Bromide Monohydrate 1.25 MCG/ACT Aers Inhale 2 puffs into the lungs daily.   Vitamin D (Ergocalciferol) 1.25 MG (50000 UT) Caps capsule Commonly known as:  [name] TAKE 1 CAPSULE BY MOUTH EVERY WEEK       Today  Patient seen and evaluated today No chest pain No shortness of breath Feels better  VITAL SIGNS:  Blood pressure (!) 136/91, pulse 60, temperature 98.4 F (36.9 C), temperature source Oral, resp. rate (!) 23, height _0  (1.575 m), weight 62.6 kg, SpO2 94 %.  I/O:    Intake/Output Summary (Last 24 hours) at 10/27/2018 1028 Last data filed at 10/27/2018 0929 Gross per 24 hour  Intake 360 ml  Output -  Net 360 ml    PHYSICAL  EXAMINATION:  Physical Exam  GENERAL:  77 y.o.-year-old patient lying in the bed with no acute distress.  LUNGS: Normal breath sounds bilaterally, no wheezing, rales,rhonchi or crepitation. No use of accessory muscles of respiration.  CARDIOVASCULAR: S1, S2 normal. No murmurs, rubs, or gallops.  ABDOMEN: Soft, non-tender, non-distended. Bowel sounds present. No organomegaly or mass.  NEUROLOGIC: Moves all 4 extremities. PSYCHIATRIC: The patient is alert and oriented x 3.  SKIN: No obvious rash, lesion, or ulcer.   DATA REVIEW:   CBC Recent Labs  Lab 10/26/18 1223  WBC 9.5  HGB 12.5  HCT 40.0  PLT 176    Chemistries  Recent Labs  Lab 10/25/18 2225  NA 140  K 3.6  CL 103  CO2 28  GLUCOSE 106*  BUN 19  CREATININE 0.81  CALCIUM 8.7*  AST 18  ALT 10  ALKPHOS 83  BILITOT 1.2    Cardiac Enzymes Recent Labs  Lab 10/26/18 1650  TROPONINI <0.03    Microbiology Results  Results for orders placed or performed during the hospital encounter of 10/25/18  Culture, blood (routine x 2)     Status: None  (Preliminary result)   Collection Time: 10/26/18  1:48 AM  Result Value Ref Range Status   Specimen Description BLOOD LEFT FOREARM  Final   Special Requests   Final    BOTTLES DRAWN AEROBIC AND ANAEROBIC Blood Culture results may not be optimal due to an excessive volume of blood received in culture bottles   Culture   Final    NO GROWTH < 12 HOURS Performed at [facility], [address] [place], [place] [address]    Report Status PENDING  Incomplete  Culture, blood (routine x 2)     Status: None (Preliminary result)   Collection Time: 10/26/18  1:48 AM  Result Value Ref Range Status   Specimen Description BLOOD LEFT HAND  Final   Special Requests   Final    BOTTLES DRAWN AEROBIC AND ANAEROBIC Blood Culture results may not be optimal due to an excessive volume of blood received in culture bottles   Culture   Final    NO GROWTH < 12 HOURS Performed at [facility], [address], [place], [place] [address]    Report Status PENDING  Incomplete    RADIOLOGY:  Dg Chest 2 View  Result Date: 10/25/2018 CLINICAL DATA:  Midsternal chest pain. EXAM: CHEST - 2 VIEW COMPARISON:  August 21, 2017 FINDINGS: No pneumothorax. Cardiomegaly. The hila and mediastinum are unremarkable. Coarsened lung markings. No focal infiltrate. No other acute abnormalities. IMPRESSION: Bilateral coarsened lung markings may represent bronchitic change versus atypical infection. No other interval change. Electronically Signed   By: Dorise Bullion III M.D   On: 10/25/2018 23:07   Ct Angio Chest Pe W/cm &/or Wo Cm  Result Date: 10/26/2018 CLINICAL DATA:  Mid sternal chest pain radiating to the back beginning at 7 p.m. last night. EXAM: CT ANGIOGRAPHY CHEST WITH CONTRAST TECHNIQUE: Multidetector CT imaging of the chest was performed using the standard protocol during bolus administration of intravenous contrast. Multiplanar CT image reconstructions and MIPs were obtained to evaluate the vascular  anatomy. CONTRAST:  41m OMNIPAQUE IOHEXOL 350 MG/ML SOLN COMPARISON:  Chest radiograph, 10/25/2018.  Chest CT, 08/21/2018. FINDINGS: Cardiovascular: There is satisfactory opacification of the pulmonary arteries to the segmental level. There is a single small nonocclusive pulmonary embolus in a left upper lobe, anterior segmental branch. No other pulmonary emboli. Heart is mildly enlarged. No  pericardial effusion. Minor left coronary artery calcifications. Mild aortic atherosclerosis. Ascending thoracic aorta measures a maximum of 3.9 cm. Mediastinum/Nodes: No neck base, axillary, mediastinal or hilar masses or pathologically enlarged lymph nodes. Lungs/Pleura: There lung nodules and area bronchiectasis with mucous plugging. Bronchiectasis with mucous plugging has increased when compared to the prior CT, most evident in the left lower lobe. There are additional nodules, an 8 mm nodule in the right upper lobe, image 32, series 6 new since the prior CT. Irregular nodule in the right lower lobe, image 47, measures 11 mm, stable from the prior CT. There also bronchial wall thickening with bronchial narrowing is noted to the superior segment of the left lower lobe increased prior CT. Focal consolidation is noted in the anteromedial left upper lobe centered on image 39, and base of the right middle lobe, new from prior CT. There also areas ground-glass opacity in both lungs. No pleural effusion. No pneumothorax. Stable pulmonary anastomosis staples in the right upper lobe. Upper Abdomen: No acute findings. Musculoskeletal: Levoscoliosis of the upper thoracic spine. No fracture or acute finding. No osteoblastic or osteolytic lesions. Review of the MIP images confirms the above findings. IMPRESSION: 1. Single small pulmonary embolism noted in an apical segmental branch to the left upper lobe. No other evidence of a pulmonary embolism. 2. Worsened lung aeration compared to the prior CT. Lung abnormalities are predominantly  areas of bronchiectasis mucous plugging with areas tree-in-bud type opacities. Also lung nodules as described, areas of ground-glass opacity and areas of consolidation, the latter are noted in the left upper lobe and base of the right middle lobe. Overall, findings are consistent progression atypical infection such as [name] or noninfectious inflammatory process. Aortic Atherosclerosis (ICD10-I70.0). Electronically Signed   By: Lajean Manes M.D.   On: 10/26/2018 00:41   US Venous Img Lower Bilateral  Result Date: 10/26/2018 CLINICAL DATA:  Pulmonary embolism.  Lower extremity edema. EXAM: BILATERAL LOWER EXTREMITY VENOUS DOPPLER ULTRASOUND TECHNIQUE: Gray-scale sonography with graded compression, as well as color Doppler and duplex ultrasound were performed to evaluate the lower extremity deep venous systems from the level of the common femoral vein and including the common femoral, femoral, profunda femoral, popliteal and calf veins including the posterior tibial, peroneal and gastrocnemius veins when visible. The superficial great saphenous vein was also interrogated. Spectral Doppler was utilized to evaluate flow at rest and with distal augmentation maneuvers in the common femoral, femoral and popliteal veins. COMPARISON:  None. FINDINGS: RIGHT LOWER EXTREMITY Common Femoral Vein: No evidence of thrombus. Normal compressibility, respiratory phasicity and response to augmentation. Saphenofemoral Junction: No evidence of thrombus. Normal compressibility and flow on color Doppler imaging. Profunda Femoral Vein: No evidence of thrombus. Normal compressibility and flow on color Doppler imaging. Femoral Vein: No evidence of thrombus. Normal compressibility, respiratory phasicity and response to augmentation. Popliteal Vein: No evidence of thrombus. Normal compressibility, respiratory phasicity and response to augmentation. Calf Veins: No evidence of thrombus. Normal compressibility and flow on color Doppler imaging.  Superficial Great Saphenous Vein: No evidence of thrombus. Normal compressibility. Venous Reflux:  None. Other Findings:  None. LEFT LOWER EXTREMITY Common Femoral Vein: No evidence of thrombus. Normal compressibility, respiratory phasicity and response to augmentation. Saphenofemoral Junction: No evidence of thrombus. Normal compressibility and flow on color Doppler imaging. Profunda Femoral Vein: No evidence of thrombus. Normal compressibility and flow on color Doppler imaging. Femoral Vein: No evidence of thrombus. Normal compressibility, respiratory phasicity and response to augmentation. Popliteal Vein: No evidence of thrombus. Normal  compressibility, respiratory phasicity and response to augmentation. Calf Veins: No evidence of thrombus. Normal compressibility and flow on color Doppler imaging. Superficial Great Saphenous Vein: No evidence of thrombus. Normal compressibility. Venous Reflux:  None. Other Findings:  None. IMPRESSION: No evidence of deep venous thrombosis. Electronically Signed   By: Logan Bores M.D.   On: 10/26/2018 12:44    Follow up with PCP in 1 week.  Management plans discussed with the patient, family and they are in agreement.  CODE STATUS: DNR    Code Status Orders  (From admission, onward)         Start     Ordered   10/26/18 0437  Do not attempt resuscitation (DNR)  Continuous    Question Answer Comment  In the event of cardiac or respiratory ARREST Do not call a "code blue"   In the event of cardiac or respiratory ARREST Do not perform Intubation, CPR, defibrillation or ACLS   In the event of cardiac or respiratory ARREST Use medication by any route, position, wound care, and other measures to relive pain and suffering. May use oxygen, suction and manual treatment of airway obstruction as needed for comfort.      10/26/18 0436        Code Status History    Date Active Date Inactive Code Status Order ID Comments User Context   05/20/2015 1542 06/18/2016 1620  DNR [id number]  Megan Salon, MD Outpatient    Advance Directive Documentation     Most Recent Value  Type of Advance Directive  Healthcare Power of Attorney, Living will, Out of facility DNR (pink MOST or yellow form)  Pre-existing out of facility DNR order (yellow form or pink MOST form)  -  "MOST" Form in Place?  -      TOTAL TIME TAKING CARE OF THIS PATIENT ON DAY OF DISCHARGE: more than 33 minutes.   Saundra Shelling M.D on 10/27/2018 at 10:28 AM  Between 7am to 6pm - Pager - [phone]  After 6pm go to [email or web address] - password [email or web address] [facility]  Office  [phone]  CC: Primary care physician; [name], MD  Note: This dictation was prepared with Dragon dictation along with smaller phrase technology. Any transcriptional errors that result from this process are unintentional.

## 2018-10-28 ENCOUNTER — Other Ambulatory Visit: Payer: Self-pay

## 2018-10-28 ENCOUNTER — Emergency Department: Payer: Medicare Other

## 2018-10-28 ENCOUNTER — Telehealth: Payer: Self-pay | Admitting: *Deleted

## 2018-10-28 ENCOUNTER — Encounter: Payer: Self-pay | Admitting: Emergency Medicine

## 2018-10-28 ENCOUNTER — Emergency Department
Admission: EM | Admit: 2018-10-28 | Discharge: 2018-10-28 | Disposition: A | Discharge status: Home / Self Care | Payer: Medicare Other | Attending: Emergency Medicine | Admitting: Emergency Medicine

## 2018-10-28 DIAGNOSIS — I10 Essential (primary) hypertension: Secondary | ICD-10-CM | POA: Diagnosis not present

## 2018-10-28 DIAGNOSIS — J449 Chronic obstructive pulmonary disease, unspecified: Secondary | ICD-10-CM | POA: Insufficient documentation

## 2018-10-28 DIAGNOSIS — R0789 Other chest pain: Secondary | ICD-10-CM | POA: Diagnosis not present

## 2018-10-28 DIAGNOSIS — R0602 Shortness of breath: Secondary | ICD-10-CM | POA: Diagnosis not present

## 2018-10-28 DIAGNOSIS — G20 Parkinson's disease: Secondary | ICD-10-CM | POA: Insufficient documentation

## 2018-10-28 LAB — TROPONIN I: Troponin I: <0.03 ng/mL (ref <0.03)

## 2018-10-28 LAB — BASIC METABOLIC PANEL
Anion gap: 10 (ref 5–15)
BUN: 17 mg/dL (ref 8–23)
CO2: 26 mmol/L (ref 22–32)
Calcium: 9.1 mg/dL (ref 8.9–10.3)
Chloride: 102 mmol/L (ref 98–111)
Creatinine, Ser: 0.96 mg/dL (ref 0.44–1.00)
GFR calc Af Amer: >60 mL/min (ref >60)
GFR calc non Af Amer: 58 mL/min — ABNORMAL LOW (ref >60)
Glucose, Bld: 95 mg/dL (ref 70–99)
Potassium: 3.9 mmol/L (ref 3.5–5.1)
Sodium: 138 mmol/L (ref 135–145)

## 2018-10-28 LAB — CBC
HCT: 37.2 % (ref 36.0–46.0)
Hemoglobin: 12 g/dL (ref 12.0–15.0)
MCH: 31.5 pg (ref 26.0–34.0)
MCHC: 32.3 g/dL (ref 30.0–36.0)
MCV: 97.6 fL (ref 80.0–100.0)
Platelets: 195 10*3/uL (ref 150–400)
RBC: 3.81 MIL/uL — ABNORMAL LOW (ref 3.87–5.11)
RDW: 13.7 % (ref 11.5–15.5)
WBC: 6.2 10*3/uL (ref 4.0–10.5)
nRBC: 0 % (ref 0.0–0.2)

## 2018-10-28 MED ORDER — IOHEXOL 350 MG/ML SOLN
75.0000 mL | Freq: Once | INTRAVENOUS | Status: AC | PRN
  Administered 2018-10-28: 75 mL via INTRAVENOUS

## 2018-10-28 MED ORDER — TRAMADOL HCL 50 MG PO TABS: 50.0000 mg | ORAL_TABLET | Freq: Four times a day (QID) | ORAL | 0 refills | Status: DC | PRN

## 2018-10-28 NOTE — NC FL2 (Signed)
[place] LEVEL OF CARE SCREENING TOOL     IDENTIFICATION  Patient Name: Crystal Bass Birthdate: Jul 09, 1941 Sex: female Admission Date (Current Location): 10/28/2018  [place] and [place] Number:  Engineering geologist and Address:  [facility], [address] [place],  [address]      Provider Number: [id number]  Attending Physician Name and Address:  Earleen Newport, MD  Relative Name and Phone Number:       Current Level of Care: Hospital Recommended Level of Care: [place] Prior Approval Number:    Date Approved/Denied:   PASRR Number: ([id number])  Discharge Plan: SNF    Current Diagnoses: Patient Active Problem List   Diagnosis Date Noted  . Chest pain 10/26/2018  . Orthostatic hypotension 08/25/2018  . Left patella fracture 04/25/2018  . Right ankle injury, initial encounter 04/25/2018  . Renal cyst, acquired, right 02/02/2018  . Advanced care planning/counseling discussion 01/26/2018  . Liver lesion 01/21/2018  . Low serum vitamin B12 01/21/2018  . Right-sided chest wall pain 01/21/2018  . Chronic fatigue 10/23/2017  . Lesion of left lung 10/23/2017  . Genetic testing 05/01/2017  . Encounter for general adult medical examination with abnormal findings 12/27/2016  . Parkinson disease ([place]) 01/10/2016  . DNR (do not resuscitate) 05/22/2015  . Tremor 02/07/2015  . Dysphagia, pharyngoesophageal phase 02/07/2015  . Hypercalcemia 09/14/2013  . Unspecified vitamin D deficiency 04/27/2013  . Breast cancer of upper-outer quadrant of left female breast ([place]) 09/16/2012  . Osteoporosis, post-menopausal 03/14/2012  . Aortic aneurysm ([place]) 01/01/2011  . OSTEOARTHRITIS, CERVICAL SPINE 08/15/2009  . ALLERGIC RHINITIS 03/25/2008  . Chronic obstructive airway disease with asthma ([place]) 03/25/2008  . BREAST CANCER, HX OF 03/10/2008  . Mixed hyperlipidemia 02/10/2007  . Essential hypertension  02/10/2007    Orientation RESPIRATION BLADDER Height & Weight     Self, Time, Situation, Place  Normal Continent Weight: 138 lb (62.6 kg) Height:  5\' 2"  (157.5 cm)  BEHAVIORAL SYMPTOMS/MOOD NEUROLOGICAL BOWEL NUTRITION STATUS      Continent Diet(Diet: Regular )  AMBULATORY STATUS COMMUNICATION OF NEEDS Skin   Extensive Assist Verbally Normal                       Personal Care Assistance Level of Assistance  Bathing, Feeding, Dressing Bathing Assistance: Limited assistance Feeding assistance: Independent Dressing Assistance: Limited assistance     Functional Limitations Info  Sight, Hearing, Speech Sight Info: Adequate Hearing Info: Adequate Speech Info: Adequate    SPECIAL CARE FACTORS FREQUENCY  PT (By licensed PT), OT (By licensed OT)     PT Frequency: (5) OT Frequency: (5)            Contractures      Additional Factors Info  Code Status, Allergies Code Status Info: (DNR ) Allergies Info: (Sulfonamide Derivatives, Topamax Topiramate, Biaxin Clarithromycin, Hydrocodone, Motrin Ibuprofen, Symbicort Budesonide-formoterol Fumarate, Percocet Oxycodone-acetaminophen, Tape, Tetracyclines & Related)           Current Medications (10/28/2018):  This is the current hospital active medication list No current facility-administered medications for this encounter.    Current Outpatient Medications  Medication Sig Dispense Refill  . acetaminophen (TYLENOL) 500 MG tablet Take 2 tablets (1,000 mg total) by mouth 2 (two) times daily as needed. (Patient taking differently: Take 1,000 mg by mouth 2 (two) times daily as needed for mild pain or moderate pain. )    . Albuterol Sulfate ([name]) 623 (90  Base) MCG/ACT AEPB Inhale 2 puffs into the lungs every 6 (six) hours as needed (dyspnea, wheezing).    [name] apixaban (ELIQUIS) 5 MG TABS tablet Take 2 tablets (10 mg total) by mouth 2 (two) times daily for 6 days. Take from dec 2 nd to dec 7th 24 tablet 0  . aspirin EC  81 MG tablet Take 81 mg by mouth daily.    . bisoprolol (ZEBETA) 5 MG tablet Take 1 tablet (5 mg total) by mouth daily. 30 tablet 6  . carbidopa-levodopa ([name]) 25-100 MG tablet TAKE 2 TABLETS IN THE MORNING, 1 TABLET IN THE AFTERNOON, 1 IN THE EVENING (Patient taking differently: Take 2 tablets by mouth daily. ) 360 tablet 1  . clonazePAM (KLONOPIN) 0.5 MG tablet TAKE 1/2 TABLET BY MOUTH EVERY DAY AT BEDTIME (Patient taking differently: Take 0.25 mg by mouth at bedtime. ) 45 tablet 0  . fluticasone ([name]) 110 MCG/ACT inhaler Inhale 2 puffs into the lungs 2 (two) times daily.    [name] gabapentin ([name]) 300 MG capsule Take 2 capsules (600 mg total) by mouth 3 (three) times daily. 540 capsule 3  . levofloxacin (LEVAQUIN) 500 MG tablet Take 1 tablet (500 mg total) by mouth daily for 5 days. 5 tablet 0  . omeprazole (PRILOSEC) 20 MG capsule Take 1 capsule (20 mg total) by mouth daily. 30 capsule 11  . Tiotropium Bromide Monohydrate (SPIRIVA RESPIMAT) 1.25 MCG/ACT AERS Inhale 2 puffs into the lungs daily. 1 Inhaler 0  . Vitamin D, Ergocalciferol, ([name]) 50000 units CAPS capsule TAKE 1 CAPSULE BY MOUTH EVERY WEEK (Patient taking differently: Take 50,000 Units by mouth every 7 (seven) days. ) 12 capsule 3  . [START ON 11/02/2018] apixaban (ELIQUIS) 5 MG TABS tablet Take 1 tablet (5 mg total) by mouth 2 (two) times daily. 60 tablet 1  . traMADol (ULTRAM) 50 MG tablet Take 1 tablet (50 mg total) by mouth every 6 (six) hours as needed. 20 tablet 0     Discharge Medications: Please see discharge summary for a list of discharge medications.  Relevant Imaging Results:  Relevant Lab Results:   Additional Information (SSN: [id number])  Glada Wickstrom, Veronia Beets, LCSW

## 2018-10-28 NOTE — ED Notes (Signed)
Patient verbalized understanding of discharge instructions, no questions. Patient out of ED via wheelchair in no distress.  

## 2018-10-28 NOTE — Telephone Encounter (Signed)
Patient stated that she has noticed some pain in her right leg where the clot was since getting home from the hospital. Patient stated that while at the hospital she was primarily on bedrest. Patient stated that she has been up walking around and trying to walk about 5 minutes every hour. Patient wants to know if it is normal for the pain since she has gotten home and been more active? Patient wants to know if it is okay for her to continue to walk every hour for 5 minutes? Patient stated that she has not noticed any other problems other than the pain in her calf. Transition Care Management Follow-up Telephone Call   Date discharged? 10/27/18   How have you been since you were released from the hospital? Doing okay, but having some pain in right leg/calf. See above note   Do you understand why you were in the hospital? YES   Do you understand the discharge instructions? YES   Where were you discharged to? HOME   Items Reviewed:  Medications reviewed: YES  Allergies reviewed: YES  Dietary changes reviewed: YES  Referrals reviewed: YES   Functional Questionnaire:   Activities of Daily Living (ADLs):   She states they are independent in the following: grooming,, going to the bathroom, dressing, ambulation, bathing and feeding States they require assistance with the following: Nothing  Any transportation issues/concerns?: NO  Any patient concerns? See above note   Confirmed importance and date/time of follow-up visits scheduled YES    Provider Appointment booked with Dr. [name] 11/04/18 at 12:00 noon  Confirmed with patient if condition begins to worsen call PCP or go to the ER.  Patient was given the office number and encouraged to call back with question or concerns.  : YES

## 2018-10-28 NOTE — Telephone Encounter (Signed)
Left message on vm for pt to call back.   Need to relay Dr. Synthia Innocent instructions and document answer to his question.

## 2018-10-28 NOTE — Telephone Encounter (Signed)
As long as she continues eliquis 5mg  twice daily should be ok. Do recommend continued walking 21min every hour as this will likely speed recovery. If severe pain, stop walking. Did hospital recommend any compression stockings or support socks?

## 2018-10-28 NOTE — Progress Notes (Signed)
Per Seth Bake admissions coordinator at [facility] she will bring patient into SNF from the independent living if needed. Patient discharged home from the ED today. FL2 complete and sent to [facility].  [employer], LCSW [phone]

## 2018-10-28 NOTE — ED Provider Notes (Signed)
[facility] Emergency Department Provider Note       Time seen: ----------------------------------------- 2:25 PM on 10/28/2018 -----------------------------------------   I have reviewed the triage vital signs and the nursing notes.  HISTORY   Chief Complaint Chest Pain and Shortness of Breath    HPI Crystal Bass is a 77 y.o. female with a history of arthritis, breast cancer, COPD, GERD, hypercalcemia, hypertension, pancreatitis, pneumonia with recent diagnosis of PE and DVT who presents to the ED for worsening shortness of breath with left lower chest pain.  Again patient was discharged from the hospital on Saturday after she was admitted to the hospital and started on Eliquis.  She was discharged yesterday and reports this is when the symptoms started.  Pain is not as severe as last time but the shortness of breath is worse.  Past Medical History:  Diagnosis Date  . 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 Aortic root aneurysm ([place])   . Arthritis   . Asthma   . Breast cancer ([place]) 2002   infiltrative ductal carcinoma   . Cataract 2019  . COPD (chronic obstructive pulmonary disease) ([place]) 9/08  . Ductal carcinoma of breast, estrogen receptor positive, stage 1 ([place]) 09/16/2012  . Endometriosis   . GERD (gastroesophageal reflux disease)   . Hypercalcemia 09/14/2013  . Hypertension   . Lesion of breast 1992   right, benign  . Lung disease    secondary to [name] infection  . Mastalgia 7/95  . Osteoporosis, post-menopausal 03/14/2012  . Ovarian cancer ([place]) 2004  . Pancreatitis    secondary to Cholelithiasis  . Parkinson disease ([place]) 02/10/15  . Post-thoracotomy pain syndrome   . Pseudomonas pneumonia ([place]) 5/09  . Uterine fibroid   . Vitamin D deficiency     Patient Active Problem List   Diagnosis Date Noted  . Chest pain 10/26/2018  . Orthostatic hypotension 08/25/2018  . Left patella fracture 04/25/2018  .  Right ankle injury, initial encounter 04/25/2018  . Renal cyst, acquired, right 02/02/2018  . Advanced care planning/counseling discussion 01/26/2018  . Liver lesion 01/21/2018  . Low serum vitamin B12 01/21/2018  . Right-sided chest wall pain 01/21/2018  . Chronic fatigue 10/23/2017  . Lesion of left lung 10/23/2017  . Genetic testing 05/01/2017  . Encounter for general adult medical examination with abnormal findings 12/27/2016  . Parkinson disease ([place]) 01/10/2016  . DNR (do not resuscitate) 05/22/2015  . Tremor 02/07/2015  . Dysphagia, pharyngoesophageal phase 02/07/2015  . Hypercalcemia 09/14/2013  . Unspecified vitamin D deficiency 04/27/2013  . Breast cancer of upper-outer quadrant of left female breast ([place]) 09/16/2012  . Osteoporosis, post-menopausal 03/14/2012  . Aortic aneurysm ([place]) 01/01/2011  . OSTEOARTHRITIS, CERVICAL SPINE 08/15/2009  . ALLERGIC RHINITIS 03/25/2008  . Chronic obstructive airway disease with asthma ([place]) 03/25/2008  . BREAST CANCER, HX OF 03/10/2008  . Mixed hyperlipidemia 02/10/2007  . Essential hypertension 02/10/2007    Past Surgical History:  Procedure Laterality Date  . 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 APPENDECTOMY  2004  . BREAST BIOPSY  08/2004   right breast-benign  . BREAST LUMPECTOMY  1992   benign  . CARDIAC CATHETERIZATION  2007   essentially negation for significant CAD  . CHOLECYSTECTOMY  2001  . COLONOSCOPY  2014   Dr [name] , due 2019  . LUNG BIOPSY  2002   [name],  Dr [name]  . MASTECTOMY MODIFIED RADICAL Left 2002   oral chemotheraphy (tamoxifen then Armidex) no radiation, Dr.[name]  . TONSILLECTOMY  1946    Allergies Sulfonamide derivatives; Topamax [topiramate]; Biaxin [clarithromycin]; Hydrocodone; Motrin [ibuprofen]; Symbicort [budesonide-formoterol fumarate]; Percocet [oxycodone-acetaminophen]; Tape; and Tetracyclines & related  Social History Social History    Tobacco Use  . Smoking status: Never Smoker  . Smokeless tobacco: Never Used  Substance Use Topics  . Alcohol use: No    Alcohol/week: 0.0 standard drinks  . Drug use: No   Review of Systems Constitutional: Negative for fever. Cardiovascular: Positive for left-sided chest pain Respiratory: Positive for shortness of breath Gastrointestinal: Negative for abdominal pain, vomiting and diarrhea. Musculoskeletal: Negative for back pain. Skin: Negative for rash. Neurological: Negative for headaches, focal weakness or numbness.  All systems negative/normal/unremarkable except as stated in the HPI  ____________________________________________   PHYSICAL EXAM:  VITAL SIGNS: ED Triage Vitals [10/28/18 1414]  Enc Vitals Group     BP (!) 148/87     Pulse Rate 63     Resp (!) 32     Temp 97.7 F (36.5 C)     Temp Source Oral     SpO2 97 %     Weight 138 lb (62.6 kg)     Height 5\' 2"  (1.575 m)     Head Circumference      Peak Flow      Pain Score 6     Pain Loc      Pain Edu?      Excl. in [place]?    Constitutional: Alert and oriented. Well appearing and in no distress. Eyes: Conjunctivae are normal. Normal extraocular movements. ENT   Head: Normocephalic and atraumatic.   Nose: No congestion/rhinnorhea.   Mouth/Throat: Mucous membranes are moist.   Neck: No stridor. Cardiovascular: Normal rate, regular rhythm. No murmurs, rubs, or gallops. Respiratory: Normal respiratory effort without tachypnea nor retractions. Breath sounds are clear and equal bilaterally. No wheezes/rales/rhonchi. Gastrointestinal: Soft and nontender. Normal bowel sounds Musculoskeletal: Nontender with normal range of motion in extremities. No lower extremity tenderness nor edema. Neurologic:  Normal speech and language. No gross focal neurologic deficits are appreciated.  Skin:  Skin is warm, dry and intact. No rash noted. Psychiatric: Mood and affect are normal. Speech and behavior are  normal.  ____________________________________________  EKG: Interpreted by me.  Sinus rhythm rate 64 bpm, left atrial enlargement, LVH, nonspecific ST segment changes, normal QT  ____________________________________________  ED COURSE:  As part of my medical decision making, I reviewed the following data within the [place] History obtained from family if available, nursing notes, old chart and ekg, as well as notes from prior ED visits. Patient presented for shortness of breath and chest pain, we will assess with labs and imaging as indicated at this time. Clinical Course as of Oct 28 1550  Tue Oct 28, 2018  1541 Creatinine: 0.96 [JW]    Clinical Course User Index [JW] Earleen Newport, MD   Procedures ____________________________________________   LABS (pertinent positives/negatives)  Labs Reviewed  BASIC METABOLIC PANEL - Abnormal; Notable for the following components:      Result Value   GFR calc non Af Amer 58 (*)    All other components within normal limits  CBC - Abnormal; Notable for the following components:   RBC 3.81 (*)    All other components within normal limits  TROPONIN I    RADIOLOGY Images were viewed by me  Chest  x-ray  IMPRESSION: No active cardiopulmonary disease. CTA chest IMPRESSION:  Resolution of previously seen left upper lobe segmental pulmonary  embolus. No new emboli are seen.    Multiple nodular changes throughout the right lung stable from the  recent exam. These are consistent with the given clinical history of  [name]    Postoperative changes on the right.    Area of consolidation in the medial aspect of the left upper lobe  likely related to pulmonary infarct.    Stable dilatation of the ascending aorta.    Aortic Atherosclerosis (ICD10-I70.0).      ____________________________________________  DIFFERENTIAL DIAGNOSIS   Pneumonia, atelectasis, recurrent PE, pneumothorax  FINAL ASSESSMENT  AND PLAN  Dyspnea, recent PE   Plan: The patient had presented for recurrent dyspnea and chest pain. Patient's labs did not reveal any acute process. Patient's imaging regarding her chest x-ray was negative, CT angiogram was performed again to evaluate for interval change or repeat PE which was negative.  She will be discharged with pain medicine and is cleared for outpatient follow-up.   Laurence Aly, MD   Note: This note was generated in part or whole with voice recognition software. Voice recognition is usually quite accurate but there are transcription errors that can and very often do occur. I apologize for any typographical errors that were not detected and corrected.     Earleen Newport, MD 10/28/18 [phone]

## 2018-10-28 NOTE — ED Triage Notes (Signed)
Patient from [place] living via [place]. Patient was diagnosed with PE on Saturday and admitted to hospital and started on Eloquis. Patient discharged yesterday and reports this morning she woke up with worsening SOB and pain in the left lower chest. States pain is not as severe as last time but SOB is worse.

## 2018-10-29 ENCOUNTER — Encounter: Payer: Self-pay | Admitting: Family Medicine

## 2018-10-29 NOTE — Telephone Encounter (Signed)
[employer] [facility] faxed note pt returned Lisa's call 10/28/18 at 5:21 pm.

## 2018-10-30 NOTE — Telephone Encounter (Signed)
Spoke with pt relaying instructions per Dr. Darnell Level.  Pt verbalizes understanding and states the hospital did not mention anything about compression stockings/support socks.

## 2018-10-30 NOTE — Telephone Encounter (Signed)
Left message on vm for pt to call back.   Need to relay Dr. Synthia Innocent instructions below and document answer to his question.

## 2018-10-30 NOTE — Telephone Encounter (Signed)
Noted. Will review at [place] next week.

## 2018-10-31 ENCOUNTER — Ambulatory Visit: Payer: Medicare Other | Admitting: Family Medicine

## 2018-10-31 ENCOUNTER — Encounter: Payer: Self-pay | Admitting: Family Medicine

## 2018-10-31 VITALS — BP 108/60 | HR 55 | Temp 97.6°F | Ht 62.5 in | Wt 140.2 lb

## 2018-10-31 DIAGNOSIS — G20 Parkinson's disease: Secondary | ICD-10-CM | POA: Diagnosis not present

## 2018-10-31 DIAGNOSIS — I951 Orthostatic hypotension: Secondary | ICD-10-CM | POA: Diagnosis not present

## 2018-10-31 DIAGNOSIS — I10 Essential (primary) hypertension: Secondary | ICD-10-CM | POA: Diagnosis not present

## 2018-10-31 DIAGNOSIS — N281 Cyst of kidney, acquired: Secondary | ICD-10-CM

## 2018-10-31 DIAGNOSIS — I2699 Other pulmonary embolism without acute cor pulmonale: Secondary | ICD-10-CM | POA: Diagnosis not present

## 2018-10-31 DIAGNOSIS — J449 Chronic obstructive pulmonary disease, unspecified: Secondary | ICD-10-CM

## 2018-10-31 DIAGNOSIS — A310 Pulmonary mycobacterial infection: Secondary | ICD-10-CM

## 2018-10-31 DIAGNOSIS — Z86711 Personal history of pulmonary embolism: Secondary | ICD-10-CM | POA: Insufficient documentation

## 2018-10-31 LAB — CULTURE, BLOOD (ROUTINE X 2)
Culture: NO GROWTH
Culture: NO GROWTH

## 2018-10-31 NOTE — Progress Notes (Signed)
BP 108/60 (BP Location: Right Arm, Patient Position: Sitting, Cuff Size: Normal)   Pulse (!) 55   Temp 97.6 F (36.4 C) (Oral)   Ht 5' 2.5" (1.588 m)   Wt 140 lb 4 oz (63.6 kg)   SpO2 96%   BMI 25.24 kg/m   No data found.   CC: hosp f/u visit Subjective:    Patient ID: Crystal Bass, female    DOB: Jan 19, 1941, 77 y.o.   MRN: [id number]  HPI: Crystal Bass is a 77 y.o. female presenting on 10/31/2018 for Hospitalization Follow-up (Seen at [facility] ED on 10/28/18. )   Recent hospitalization for chest pain after R lower leg pain, found to have acute pulmonary embolism (LUL apical and peripheral segmental embolus without R heart strain) as well as increased ground glass opacities concerning for progression of atypical pulmonary mycobacterial infection. She received levofloxacin antibiotic for [name], finished yesterday. Small PE treated with IV heparin, then transitioned tooral eliquis. blcx NG x2 final.   Seen at ER on 12/3 with ongoing chest pain, workup unrevealing (rpt CTA showing likely pulm infarct but without progression or new PE).  Ongoing orthostasis saw cards Fletcher Anon), note reviewed - recommended good fluid intake, abdominal binder during the day, consideration for Northera.   COPD with asthma followed by pulm on flovent and spiriva. Also sees allergist.   No recent car ride, plane ride, recent surgery. No prior h/o blood clots.  She has started walking regularly - trying to walk 1 mi/day.  No fmhx blood clots.   Cancer screenings: COLONOSCOPY 09/2018 - TA x2 no repeat recommended [name]) Lung cancer screening - not eligible, and has had several CTs recently Breast cancer screening - h/o L breast cancer s/p mastectomy 2002. R mammogram WNL 10/2017.  Well woman exam - with OBGYN Dr Sabra Heck. 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 Last seen 04/2017. stable CT abd/pelvis 01/2018 except for ?gastric thickening, not seen on f/u imaging.  ESOPHAGOGASTRODUODENOSCOPY  09/2018 - GERD with esophagitis and stricture dilated, antral gastritis negative for H pylori [name])   DATE OF ADMISSION:  10/25/2018   DATE OF DISCHARGE: 10/27/2018 TCM hosp f/u phone call completed 10/28/2018  D/C Diagnosis: Atypical chest pain Acute pulmonary volume Atypical mycobacterial infection of the lung  Relevant past medical, surgical, family and social history reviewed and updated as indicated. Interim medical history since our last visit reviewed. Allergies and medications reviewed and updated. Discharge medications reconciled with current home medication list Outpatient Medications Prior to Visit  Medication Sig Dispense Refill  . acetaminophen (TYLENOL) 500 MG tablet Take 2 tablets (1,000 mg total) by mouth 2 (two) times daily as needed. (Patient taking differently: Take 1,000 mg by mouth 2 (two) times daily as needed for mild pain or moderate pain. )    . Albuterol Sulfate ([name]) 782 (90 Base) MCG/ACT AEPB Inhale 2 puffs into the lungs every 6 (six) hours as needed (dyspnea, wheezing).    Derrill Memo ON 11/02/2018] apixaban (ELIQUIS) 5 MG TABS tablet Take 1 tablet (5 mg total) by mouth 2 (two) times daily. 60 tablet 1  . aspirin EC 81 MG tablet Take 81 mg by mouth daily.    . bisoprolol (ZEBETA) 5 MG tablet Take 1 tablet (5 mg total) by mouth daily. 30 tablet 6  . carbidopa-levodopa ([name]) 25-100 MG tablet TAKE 2 TABLETS IN THE MORNING, 1 TABLET IN THE AFTERNOON, 1 IN THE EVENING (Patient taking differently: Take 2 tablets by mouth daily. )  360 tablet 1  . clonazePAM (KLONOPIN) 0.5 MG tablet TAKE 1/2 TABLET BY MOUTH EVERY DAY AT BEDTIME (Patient taking differently: Take 0.25 mg by mouth at bedtime. ) 45 tablet 0  . fluticasone ([name]) 110 MCG/ACT inhaler Inhale 2 puffs into the lungs 2 (two) times daily.    [name] gabapentin ([name]) 300 MG capsule Take 2 capsules (600 mg total) by mouth 3 (three) times daily. 540 capsule 3  . omeprazole (PRILOSEC) 20 MG  capsule Take 1 capsule (20 mg total) by mouth daily. 30 capsule 11  . traMADol (ULTRAM) 50 MG tablet Take 1 tablet (50 mg total) by mouth every 6 (six) hours as needed. 20 tablet 0  . Vitamin D, Ergocalciferol, ([name]) 50000 units CAPS capsule TAKE 1 CAPSULE BY MOUTH EVERY WEEK (Patient taking differently: Take 50,000 Units by mouth every 7 (seven) days. ) 12 capsule 3  . levofloxacin (LEVAQUIN) 500 MG tablet Take 1 tablet (500 mg total) by mouth daily for 5 days. 5 tablet 0  . apixaban (ELIQUIS) 5 MG TABS tablet Take 2 tablets (10 mg total) by mouth 2 (two) times daily for 6 days. Take from dec 2 nd to dec 7th (Patient not taking: Reported on 10/31/2018) 24 tablet 0  . Tiotropium Bromide Monohydrate (SPIRIVA RESPIMAT) 1.25 MCG/ACT AERS Inhale 2 puffs into the lungs daily. 1 Inhaler 0   No facility-administered medications prior to visit.      Per HPI unless specifically indicated in ROS section below Review of Systems     Objective:    BP 108/60 (BP Location: Right Arm, Patient Position: Sitting, Cuff Size: Normal)   Pulse (!) 55   Temp 97.6 F (36.4 C) (Oral)   Ht 5' 2.5" (1.588 m)   Wt 140 lb 4 oz (63.6 kg)   SpO2 96%   BMI 25.24 kg/m   Wt Readings from Last 3 Encounters:  10/31/18 140 lb 4 oz (63.6 kg)  10/28/18 138 lb (62.6 kg)  10/25/18 138 lb (62.6 kg)    Physical Exam  Constitutional: She appears well-developed and well-nourished. No distress.  HENT:  Head: Normocephalic and atraumatic.  Mouth/Throat: Oropharynx is clear and moist. No oropharyngeal exudate.  Eyes: Pupils are equal, round, and reactive to light. EOM are normal.  Cardiovascular: Normal rate, regular rhythm and normal heart sounds.  No murmur heard. Pulmonary/Chest: Effort normal and breath sounds normal. No respiratory distress. She has no wheezes. She has no rales.  Few bibasilar crackles  Musculoskeletal: Normal range of motion. She exhibits no edema.  1+ DP bilaterally  Psychiatric: She has a  normal mood and affect.  Nursing note and vitals reviewed.  Results for orders placed or performed during the hospital encounter of 00/93/81  Basic metabolic panel  Result Value Ref Range   Sodium 138 135 - 145 mmol/L   Potassium 3.9 3.5 - 5.1 mmol/L   Chloride 102 98 - 111 mmol/L   CO2 26 22 - 32 mmol/L   Glucose, Bld 95 70 - 99 mg/dL   BUN 17 8 - 23 mg/dL   Creatinine, Ser 0.96 0.44 - 1.00 mg/dL   Calcium 9.1 8.9 - 10.3 mg/dL   GFR calc non Af Amer 58 (L) >60 mL/min   GFR calc Af Amer >60 >60 mL/min   Anion gap 10 5 - 15  CBC  Result Value Ref Range   WBC 6.2 4.0 - 10.5 K/uL   RBC 3.81 (L) 3.87 - 5.11 MIL/uL   Hemoglobin 12.0 12.0 -  15.0 g/dL   HCT 37.2 36.0 - 46.0 %   MCV 97.6 80.0 - 100.0 fL   MCH 31.5 26.0 - 34.0 pg   MCHC 32.3 30.0 - 36.0 g/dL   RDW 13.7 11.5 - 15.5 %   Platelets 195 150 - 400 K/uL   nRBC 0.0 0.0 - 0.2 %  Troponin I - ONCE - STAT  Result Value Ref Range   Troponin I <0.03 <0.03 ng/mL      Assessment & Plan:   Problem List Items Addressed This Visit    Renal cyst, acquired, right    Will further eval prior complex kidney cyst at f/u visit (due for f/u US 01/2018).       Pulmonary embolism and infarction [facility]) - Primary    1st unprovoked segmental PE with pulm infarct found 10/26/2018. Will need at least 3 months of anticoagulation. May benefit from prolonged anticoagulation pending how she tolerates eliquis.  UTD on cancer screenings Consider heme eval vs thrombophilia workup at f/u as well.       Parkinson disease ([place])   Orthostatic hypotension    Persistent orthostatics today.  Saw cards - I have provided patient with compression stockings and abdominal binder to try.  Only on bisoprolol 5mg  daily.       [name] (mycobacterium avium-intracellulare) infection ([place])    Seen on recent chest CTA, treated with levaquin 5d course in hospital. I encouraged pulm f/u.       Essential hypertension    Off losartan. Continue bisoprolol 5mg .  orthostasis limits medication.           No orders of the defined types were placed in this encounter.  No orders of the defined types were placed in this encounter.   Follow up plan: Return in about 2 months (around 01/01/2019) for follow up visit.  [name], MD

## 2018-10-31 NOTE — Patient Instructions (Addendum)
Try low dose compression stockings and abdominal binder (prescriptions provided today to take to local durable medical equipment store).   Keep pulmonology appointment, check with them about PFTs.  Continue walking regularly.  Continue eliquis for at least 3 months. Return in 2 months for follow up visit - ok to cancel Monday's appointment.

## 2018-11-01 ENCOUNTER — Telehealth: Payer: Self-pay | Admitting: Family Medicine

## 2018-11-01 DIAGNOSIS — A310 Pulmonary mycobacterial infection: Secondary | ICD-10-CM | POA: Insufficient documentation

## 2018-11-01 NOTE — Assessment & Plan Note (Addendum)
Persistent orthostatics today.  Saw cards - I have provided patient with compression stockings and abdominal binder to try.  Only on bisoprolol 5mg  daily.

## 2018-11-01 NOTE — Assessment & Plan Note (Addendum)
Off losartan. Continue bisoprolol 5mg . orthostasis limits medication.

## 2018-11-01 NOTE — Assessment & Plan Note (Addendum)
1st unprovoked segmental PE with pulm infarct found 10/26/2018. Will need at least 3 months of anticoagulation. May benefit from prolonged anticoagulation pending how she tolerates eliquis.  UTD on cancer screenings Consider heme eval vs thrombophilia workup at f/u as well.

## 2018-11-01 NOTE — Telephone Encounter (Signed)
plz call - what is reason for her taking baby aspirin? As she's no on eliquis 5mg  twice daily (verify she's taking this), I think it ok to stop her aspirin.

## 2018-11-01 NOTE — Assessment & Plan Note (Addendum)
Seen on recent chest CTA, treated with levaquin 5d course in hospital. I encouraged pulm f/u.

## 2018-11-01 NOTE — Assessment & Plan Note (Signed)
Will further eval prior complex kidney cyst at f/u visit (due for f/u US 01/2018).

## 2018-11-03 NOTE — Telephone Encounter (Signed)
Spoke with pt asking about aspirin and Eliquis. Pt confirms she is taking both.  I relayed Dr. Synthia Innocent instructions.  Pt verbalizes understanding and states she will stop the aspirin.

## 2018-11-03 NOTE — Telephone Encounter (Signed)
Best number [phone] Pt returned your call

## 2018-11-03 NOTE — Telephone Encounter (Signed)
Left message on vm per dpr relaying Dr. Synthia Innocent message.  Asked pt to call back to let him know if she is taking baby aspirin and Eliquis twice a day.  If so, she needs to stop the aspirin per Dr. Darnell Level.

## 2018-11-04 ENCOUNTER — Ambulatory Visit: Payer: Medicare Other | Admitting: Family Medicine

## 2018-11-08 ENCOUNTER — Other Ambulatory Visit: Payer: Self-pay | Admitting: Neurology

## 2018-11-10 ENCOUNTER — Telehealth: Payer: Self-pay | Admitting: Internal Medicine

## 2018-11-10 NOTE — Telephone Encounter (Signed)
Called and spoke with the patient she stated that she is supposed to have a PFT done next week however she has just recently gotten over a PE. Patient wants to know if this still needs to be done. SG please advise per new protocol.

## 2018-11-10 NOTE — Telephone Encounter (Signed)
Sounds good. Thanks /. Support plan from Judson Roch

## 2018-11-10 NOTE — Telephone Encounter (Signed)
Patient aware nothing further need at this time.

## 2018-11-10 NOTE — Telephone Encounter (Signed)
We will cancel PFT's next week. They need to be reflective of patient's baseline whe  She is well.We will have her follow up with MR as scheduled 12/02/2017, and he can evaluate for timing of PFT's.Thanks

## 2018-11-17 ENCOUNTER — Ambulatory Visit: Payer: Medicare Other | Admitting: Family Medicine

## 2018-11-20 NOTE — Progress Notes (Deleted)
77 y.o. G0P0000 Widowed White or Caucasian female here for annual exam.    No LMP recorded. Patient has had a hysterectomy.          Sexually active: {yes no:314532}  The current method of family planning is {contraception:315051}.    Exercising: {yes no:314532}  {types:19826} Smoker:  {YES P5382123  Health Maintenance: Pap:  10/06/08 normal History of abnormal Pap:  no MMG:  11/05/17 Colonoscopy:   4/14- repeat 5 years- Dr. [name]  BMD:  02/22/16 osteopenia   TDaP:  01/01/11 Pneumonia vaccine(s):  10/21/12 02/12/14 Shingrix:    Hep C testing: *** Screening Labs: ***, Hb today: ***, Urine today: ***   reports that she has never smoked. She has never used smokeless tobacco. She reports that she does not drink alcohol or use drugs.  Past Medical History:  Diagnosis Date  . 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 Arthritis   . Asthma   . Breast cancer ([place]) 2002   infiltrative ductal carcinoma   . Cataract 2019  . COPD (chronic obstructive pulmonary disease) ([place]) 9/08  . Ductal carcinoma of breast, estrogen receptor positive, stage 1 ([place]) 09/16/2012  . Endometriosis   . GERD (gastroesophageal reflux disease)   . Hypercalcemia 09/14/2013  . Hypertension   . Lesion of breast 1992   right, benign  . Lung disease    secondary to [name] infection  . Mastalgia 7/95  . Osteoporosis, post-menopausal 03/14/2012  . Ovarian cancer ([place]) 2004  . Pancreatitis    secondary to Cholelithiasis  . Parkinson disease ([place]) 02/10/15  . Post-thoracotomy pain syndrome   . Pseudomonas pneumonia ([place]) 5/09  . Uterine fibroid   . Vitamin D deficiency     Past Surgical History:  Procedure Laterality Date  . 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 APPENDECTOMY  2004  . BREAST BIOPSY  08/2004   right breast-benign  . BREAST LUMPECTOMY  1992   benign  . CARDIAC CATHETERIZATION  2007   essentially negation for significant CAD  . CHOLECYSTECTOMY  2001  . COLONOSCOPY  2014   Dr [name] , due 2019  . COLONOSCOPY  09/2018   TA x2 no repeat recommended [name])  . ESOPHAGOGASTRODUODENOSCOPY  09/2018   GERD with esophagitis and stricture dilated, antral gastritis negative for H pylori [name])  . LUNG BIOPSY  2002   [name], Dr [name]  . MASTECTOMY MODIFIED RADICAL Left 2002   oral chemotheraphy (tamoxifen then Armidex) no radiation, Dr.[name]  . TONSILLECTOMY  1946    Current Outpatient Medications  Medication Sig Dispense Refill  . acetaminophen (TYLENOL) 500 MG tablet Take 2 tablets (1,000 mg total) by mouth 2 (two) times daily as needed. (Patient taking differently: Take 1,000 mg by mouth 2 (two) times daily as needed for mild pain or moderate pain. )    . Albuterol Sulfate ([name]) 347 (90 Base) MCG/ACT AEPB Inhale 2 puffs into the lungs every 6 (six) hours as needed (dyspnea, wheezing).    [name] apixaban (ELIQUIS) 5 MG TABS tablet Take 1 tablet (5 mg total) by mouth 2 (two) times daily. 60 tablet 1  . aspirin EC 81 MG tablet Take 81 mg by mouth daily.    . bisoprolol (ZEBETA) 5 MG tablet Take 1 tablet (5 mg total) by mouth daily. 30 tablet 6  . carbidopa-levodopa (SINEMET  IR) 25-100 MG tablet TAKE 2 TABLETS IN THE MORNING, 1 TABLET IN THE AFTERNOON, 1 IN THE EVENING 360 tablet 0  . clonazePAM (KLONOPIN) 0.5 MG tablet TAKE 1/2 TABLET BY MOUTH EVERY DAY AT BEDTIME (Patient taking differently: Take 0.25 mg by mouth at bedtime. ) 45 tablet 0  . fluticasone ([name]) 110 MCG/ACT inhaler Inhale 2 puffs into the lungs 2 (two) times daily.    [name] gabapentin ([name]) 300 MG capsule Take 2 capsules (600 mg total) by mouth 3 (three) times daily. 540 capsule 3  . omeprazole (PRILOSEC) 20 MG capsule Take 1 capsule (20 mg total) by mouth daily. 30 capsule 11  . traMADol (ULTRAM) 50 MG tablet Take 1 tablet (50 mg total) by mouth every 6 (six) hours as needed. 20 tablet 0  .  Vitamin D, Ergocalciferol, ([name]) 50000 units CAPS capsule TAKE 1 CAPSULE BY MOUTH EVERY WEEK (Patient taking differently: Take 50,000 Units by mouth every 7 (seven) days. ) 12 capsule 3   No current facility-administered medications for this visit.     Family History  Problem Relation Age of Onset  . Lung cancer Father 71       d.82 history of smoking  . Breast cancer Sister 62  . Colon cancer Sister   . Emphysema Mother        d.64 was never a smoker  . Stroke Paternal Aunt        in mid 68s  . Osteoporosis Paternal Aunt   . Melanoma Maternal Aunt   . Cancer Maternal Uncle        unspecified type  . Cancer Maternal Grandmother        d.82s unspecified GI cancer  . Cancer Maternal Grandfather        d.62s unspecified type  . Liver cancer Maternal Aunt   . Cancer Maternal Uncle        unspecified type  . Cancer Maternal Uncle        unspecified type  . Myasthenia gravis Paternal Aunt   . Breast cancer Cousin        d.60s-daughter of unaffected paternal aunt Eustace Moore  . Colon cancer Cousin 72       d.70-daughter of unaffected maternal aunt Leila  . Lung cancer Cousin 70       d.70-sisters to each other, both daughters of maternal uncle Johnny  . Diabetes Neg Hx   . Heart disease Neg Hx   . Stomach cancer Neg Hx   . Ulcerative colitis Neg Hx     Review of Systems  Exam:   There were no vitals taken for this visit.  Height:      Ht Readings from Last 3 Encounters:  10/31/18 5' 2.5" (1.588 m)  10/28/18 5\' 2"  (1.575 m)  10/25/18 5\' 2"  (1.575 m)    General appearance: alert, cooperative and appears stated age Head: Normocephalic, without obvious abnormality, atraumatic Neck: no adenopathy, supple, symmetrical, trachea midline and thyroid {EXAM; THYROID:18604} Lungs: clear to auscultation bilaterally Breasts: {Exam; breast:13139::"normal appearance, no masses or tenderness"} Heart: regular rate and rhythm Abdomen: soft, non-tender; bowel sounds normal; no masses,  no  organomegaly Extremities: extremities normal, atraumatic, no cyanosis or edema Skin: Skin color, texture, turgor normal. No rashes or lesions Lymph nodes: Cervical, supraclavicular, and axillary nodes normal. No abnormal inguinal nodes palpated Neurologic: Grossly normal   Pelvic: External genitalia:  no lesions              Urethra:  normal  appearing urethra with no masses, tenderness or lesions              Bartholins and Skenes: normal                 Vagina: normal appearing vagina with normal color and discharge, no lesions              Cervix: {exam; cervix:14595}              Pap taken: {yes no:314532} Bimanual Exam:  Uterus:  {exam; uterus:12215}              Adnexa: {exam; adnexa:12223}               Rectovaginal: Confirms               Anus:  normal sphincter tone, no lesions  Chaperone was present for exam.  A:  Well Woman with normal exam  P:   {plan; gyn:5269::"mammogram","pap smear","return annually or prn"}

## 2018-11-25 ENCOUNTER — Ambulatory Visit: Payer: Medicare Other | Admitting: Obstetrics & Gynecology

## 2018-11-25 NOTE — Progress Notes (Addendum)
77 y.o. G0P0000 Widowed White or Caucasian female here for annual exam. Patient states that she has a lump in her right lower back buttock.  Feels she has a bronchial infection.  She's been coughing green mucous for several days.  Denies fever.  Has chronic SOB that is not worse today.  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  Denies fever and chills.      Has seen pulmonology, Dr. Chase Caller, this past year.  Spiriva was tried.  This didn't really helped.  Has recommended PFT's but as she had a recent PE, this has been delayed.    Denies vaginal bleeding.  Had colonoscopy and endoscopy with Dr. [name] in November.  Gastritis noted.  H pylori testing negative.  She's been started on Prilosec 20mg  daily.  Neurology:  Dr. Carles Collet, being seen every 4 months.    No LMP recorded. Patient has had a hysterectomy.          Sexually active: No.  The current method of family planning is post menopausal status.    Exercising: Yes.    Walking  Smoker:  no  Health Maintenance: Pap:  10/06/08 History of abnormal Pap:  no MMG:  11/05/17 Density B / Bi-rads 1 neg  Colonoscopy:  10/09/18, Dr. [name] . No follow up needed.  BMD:   02/22/16 Osteopenia  TDaP:  01/01/11 Pneumonia vaccine(s):  10/21/12, 02/12/14  Shingrix:  Has completed the Zostavax, completed first Shingrix 02/22/18 Hep C testing: no Screening Labs: would like to talk to provider, Hb today: , Urine today:   reports that she has never smoked. She has never used smokeless tobacco. She reports that she does not drink alcohol or use drugs.  Past Medical History:  Diagnosis Date  . 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 Arthritis   . Asthma   . Breast cancer ([place]) 2002   infiltrative ductal carcinoma   . Cataract 2019  . COPD (chronic obstructive pulmonary disease) ([place]) 9/08   . Ductal carcinoma of breast, estrogen receptor positive, stage 1 ([place]) 09/16/2012  . Endometriosis   . GERD (gastroesophageal reflux disease)   . Hypercalcemia 09/14/2013  . Hypertension   . Lesion of breast 1992   right, benign  . Lung disease    secondary to [name] infection  . Mastalgia 7/95  . Osteoporosis, post-menopausal 03/14/2012  . Ovarian cancer ([place]) 2004  . Pancreatitis    secondary to Cholelithiasis  . Parkinson disease ([place]) 02/10/15  . Post-thoracotomy pain syndrome   . Pseudomonas pneumonia ([place]) 5/09  . Uterine fibroid   . Vitamin D deficiency     Past Surgical History:  Procedure Laterality Date  . 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 APPENDECTOMY  2004  . BREAST BIOPSY  08/2004   right breast-benign  . BREAST LUMPECTOMY  1992   benign  . CARDIAC CATHETERIZATION  2007   essentially negation for significant CAD  . CHOLECYSTECTOMY  2001  . COLONOSCOPY  2014   Dr [name] , due 2019  . COLONOSCOPY  09/2018   TA x2 no repeat recommended [name])  . ESOPHAGOGASTRODUODENOSCOPY  09/2018   GERD with esophagitis and stricture dilated, antral gastritis negative for H pylori [name])  .  LUNG BIOPSY  2002   [name], Dr [name]  . MASTECTOMY MODIFIED RADICAL Left 2002   oral chemotheraphy (tamoxifen then Armidex) no radiation, Dr.[name]  . TONSILLECTOMY  1946    Current Outpatient Medications  Medication Sig Dispense Refill  . acetaminophen (TYLENOL) 500 MG tablet Take 2 tablets (1,000 mg total) by mouth 2 (two) times daily as needed. (Patient taking differently: Take 1,000 mg by mouth 2 (two) times daily as needed for mild pain or moderate pain. )    . Albuterol Sulfate ([name]) 725 (90 Base) MCG/ACT AEPB Inhale 2 puffs into the lungs every 6 (six) hours as needed (dyspnea, wheezing).    [name] apixaban (ELIQUIS) 5 MG TABS tablet Take 1 tablet (5 mg total) by mouth 2 (two) times daily. 60 tablet 1  . aspirin EC 81  MG tablet Take 81 mg by mouth daily.    . bisoprolol (ZEBETA) 5 MG tablet Take 1 tablet (5 mg total) by mouth daily. 30 tablet 6  . carbidopa-levodopa ([name]) 25-100 MG tablet TAKE 2 TABLETS IN THE MORNING, 1 TABLET IN THE AFTERNOON, 1 IN THE EVENING 360 tablet 0  . clonazePAM (KLONOPIN) 0.5 MG tablet TAKE 1/2 TABLET BY MOUTH EVERY DAY AT BEDTIME (Patient taking differently: Take 0.25 mg by mouth at bedtime. ) 45 tablet 0  . fluticasone ([name]) 110 MCG/ACT inhaler Inhale 2 puffs into the lungs 2 (two) times daily.    [name] gabapentin ([name]) 300 MG capsule Take 2 capsules (600 mg total) by mouth 3 (three) times daily. 540 capsule 3  . omeprazole (PRILOSEC) 20 MG capsule Take 1 capsule (20 mg total) by mouth daily. 30 capsule 11  . traMADol (ULTRAM) 50 MG tablet Take 1 tablet (50 mg total) by mouth every 6 (six) hours as needed. 20 tablet 0  . Vitamin D, Ergocalciferol, ([name]) 50000 units CAPS capsule TAKE 1 CAPSULE BY MOUTH EVERY WEEK (Patient taking differently: Take 50,000 Units by mouth every 7 (seven) days. ) 12 capsule 3   No current facility-administered medications for this visit.     Family History  Problem Relation Age of Onset  . Lung cancer Father 12       d.82 history of smoking  . Breast cancer Sister 49  . Colon cancer Sister   . Emphysema Mother        d.64 was never a smoker  . Stroke Paternal Aunt        in mid 11s  . Osteoporosis Paternal Aunt   . Melanoma Maternal Aunt   . Cancer Maternal Uncle        unspecified type  . Cancer Maternal Grandmother        d.82s unspecified GI cancer  . Cancer Maternal Grandfather        d.62s unspecified type  . Liver cancer Maternal Aunt   . Cancer Maternal Uncle        unspecified type  . Cancer Maternal Uncle        unspecified type  . Myasthenia gravis Paternal Aunt   . Breast cancer Cousin        d.60s-daughter of unaffected paternal aunt Eustace Moore  . Colon cancer Cousin 63       d.70-daughter of unaffected  maternal aunt Leila  . Lung cancer Cousin 70       d.70-sisters to each other, both daughters of maternal uncle Johnny  . Diabetes Neg Hx   . Heart disease Neg Hx   . Stomach cancer  Neg Hx   . Ulcerative colitis Neg Hx     Review of Systems  All other systems reviewed and are negative.   Exam:   Vitals:   11/27/18 1347  BP: 122/74  Pulse: 62  Resp: 14    General appearance: alert, cooperative and appears stated age Head: Normocephalic, without obvious abnormality, atraumatic Neck: no adenopathy, supple, symmetrical, trachea midline and thyroid normal to inspection and palpation Lungs: clear to auscultation bilaterally Breasts: normal appearance, no masses or tenderness (on right), absent left breast with well healed incisions and no masses or LAD noted Heart: regular rate and rhythm Abdomen: soft, non-tender; bowel sounds normal; no masses,  no organomegaly Extremities: extremities normal, atraumatic, no cyanosis or edema Skin: Skin color, texture, turgor normal. No rashes or lesions Lymph nodes: Cervical, supraclavicular, and axillary nodes normal. No abnormal inguinal nodes palpated Neurologic: Grossly normal   Pelvic: External genitalia:  no lesions              Urethra:  normal appearing urethra with no masses, tenderness or lesions              Bartholins and Skenes: normal                 Vagina: normal appearing vagina with normal color and discharge, no lesions              Cervix: absent              Pap taken: No. Bimanual Exam:  Uterus:  uterus absent              Adnexa: no mass, fullness, tenderness               Rectovaginal: Confirms               Anus:  normal sphincter tone, no lesions  Chaperone was present for exam.  A:  Well Woman with normal exam PMP, no HRT H/o borderline mucinous ovarian ca, stage 1C, h/o TAH/BSO 2004.  Ca-124 mildly increased last year with neg CT abd/pelvis H/O breast cancer s/p mastectomy 1/02 Osteoporosis.  Has been  treated with Zometa. H/O elevate lipids.   H/O 3.9cm aortic root aneurysm COPD, now being followed by Dr. Chase Caller PE 12/19, currently on eliquis Bronchitis  P:   Mammogram guidelines reviewed.  Desires to continue yearly MMG. pap smear not indicated BMD ordered and will be scheduled for pt Zpak to pharmacy.  Pt promises to let me know if worsens.  Has appt with Dr. Chase Caller on 12/02/18. Ca-125 obtained today.  Depending on result, may need gyn/onc consultation Return annually or prn

## 2018-11-27 ENCOUNTER — Ambulatory Visit (INDEPENDENT_AMBULATORY_CARE_PROVIDER_SITE_OTHER): Payer: Medicare Other | Admitting: Obstetrics & Gynecology

## 2018-11-27 ENCOUNTER — Encounter: Payer: Self-pay | Admitting: Obstetrics & Gynecology

## 2018-11-27 VITALS — BP 122/74 | HR 62 | Resp 14 | Ht 62.5 in | Wt 135.0 lb

## 2018-11-27 DIAGNOSIS — Z01419 Encounter for gynecological examination (general) (routine) without abnormal findings: Secondary | ICD-10-CM

## 2018-11-27 DIAGNOSIS — C561 Malignant neoplasm of right ovary: Secondary | ICD-10-CM | POA: Diagnosis not present

## 2018-11-27 DIAGNOSIS — M8580 Other specified disorders of bone density and structure, unspecified site: Secondary | ICD-10-CM | POA: Diagnosis not present

## 2018-11-27 MED ORDER — AZITHROMYCIN 250 MG PO TABS: ORAL_TABLET | ORAL | 0 refills | Status: DC

## 2018-11-28 ENCOUNTER — Other Ambulatory Visit: Payer: Self-pay | Admitting: Obstetrics & Gynecology

## 2018-11-28 ENCOUNTER — Encounter: Payer: Self-pay | Admitting: Obstetrics & Gynecology

## 2018-11-28 DIAGNOSIS — Z1231 Encounter for screening mammogram for malignant neoplasm of breast: Secondary | ICD-10-CM

## 2018-11-29 LAB — CA 125: Cancer Antigen (CA) 125: 32.1 U/mL (ref 0.0–38.1)

## 2018-12-01 NOTE — Addendum Note (Signed)
Addended by: Megan Salon on: 12/01/2018 05:24 PM   Modules accepted: Orders

## 2018-12-02 ENCOUNTER — Telehealth: Payer: Self-pay | Admitting: Internal Medicine

## 2018-12-02 ENCOUNTER — Ambulatory Visit: Payer: Medicare Other | Admitting: Internal Medicine

## 2018-12-02 ENCOUNTER — Encounter: Payer: Self-pay | Admitting: Internal Medicine

## 2018-12-02 VITALS — BP 120/70 | HR 53 | Ht 62.0 in | Wt 138.8 lb

## 2018-12-02 DIAGNOSIS — Z8619 Personal history of other infectious and parasitic diseases: Secondary | ICD-10-CM | POA: Diagnosis not present

## 2018-12-02 DIAGNOSIS — R0600 Dyspnea, unspecified: Secondary | ICD-10-CM

## 2018-12-02 DIAGNOSIS — Z8709 Personal history of other diseases of the respiratory system: Secondary | ICD-10-CM

## 2018-12-02 DIAGNOSIS — R0609 Other forms of dyspnea: Secondary | ICD-10-CM | POA: Diagnosis not present

## 2018-12-02 MED ORDER — FLUTICASONE-UMECLIDIN-VILANT 100-62.5-25 MCG/INH IN AEPB: 1.0000 | INHALATION_SPRAY | Freq: Every day | RESPIRATORY_TRACT | 0 refills | Status: DC

## 2018-12-02 NOTE — Progress Notes (Signed)
78 yowf never smoker with h/o [name] referred to Pulmonary clinic by Dr Linna Darner for chronic cough. CXR 4/13 COPD/emphysema  Stable postoperative changes involving the right lung. CT chest 03/12/11 post op changes on R, scattered perbronch nodules and mild bronchiectasis  03/13/2011 Initial pulmonary office eval in EMR era main c/o chronic cough (x years) and sob and worse x 3 weeks abruptly sob  Assoc with cough with green mucus which is a lot better p rx with augmentin  but breathing not better to her satisfaction since starting topamax with variable doe and resting sob and  having trouble talking.  No overt hb. No better on advair.  > rec stop advair, start high dose ppi/ pepcid hs and diet  03/28/2011 ov / Wert some better cough  But still sob with housework and walk at [employer], does not use [facility] parking. rec Continue Prilosec 20 mg Take 30-60 min before first meal of the day and Pepcid 20 mg one at bedtime  GERD (REFLUX) diet  04/17/2011 ov/Wert  Cc doe x years walking x 30 min x moderate pace. Cough resolved "because I stopped the topamax" Sleeping ok without nocturnal  or early am exac of resp c/o's. >>d/c ppi   08/15/2012 Acute OV  Complains of prod cough with yellow mucus, increased SOB x2 weeks.  Was given augmentin by PCP yesterday. Has not started this yet .  Not seen for > 1 year , doing okay w/out flares   no otc used.  No fever or hemoptysis . No chest pain  No recent travel or abx uses  >Take Augmentin as directed  Mucinex DM Twice daily As needed Cough/congestion  Fluids and rest  follow up 4 weeks and As needed  I will call with xray results.  Please contact office for sooner follow up if symptoms do not improve or worsen or seek emergency care   OV 09/17/2012  - switched to Dr MR Dyspnea: insidious onset. Progressive/Stable since onset several years ago. Rates dyspnea as moderate. Activities such as walking < few hundred feet or < 1/4 mile makes her dyspneic.  Humidity, pollens makes dyspnea worse.  Improved by sitting and resting. No associated chest pains. She feels she is deconditioned and is in a catch 22 situation.    Cough: is independent of dyspnea. Cough present for several years. Constant (not on and off). Worsened by infectious bronchitis episodes. Never had remission beyond 4 weeks with cough. Cough is moderate in intensity at baseline and with infectious episodes can get severe. She finds this embarrassing esp because she has to visit her ailing m-i-l in a nursing home. Quality is wet; brings out pea-green sputum during bronchitis but currently jelly like yellow which is baseline. OF note, she took 2 x 6-9 months of [name] Rx in 2002 and 2003 but she is not sure if that helped cough.    - Sinus: denies problems other than anosmia. Denies chronic rhino sinusitis symptoms Never seen ENT  - Allergies - sees Dr Donneta Romberg and has weekly allergy shots against mildew, southern grasses x 40 years. Symptoms are well controled but are typically sneezing, runny nose, eyes. Says if she does not take allergy shots - allergies return with a "vengeance"   - GERD: denies. Per hx prior trial of ppi did not help  - BP: takes losartan and lopressor  - Pulmonary - never smoker. Born with asthma. Thinks she has copd now. Does not take any mdi because she  felt she could manage without it. However, Dr Donneta Romberg has recommended mdi and she believes she is close to making a decision on this.  PFTs may 2012: Mild obstruction with non-significant BD response. Normal DLCO. Carries prior diagnosis of bronchiectasis and mom died of it. .Denies diagnostic workup for bronchiectasis and does not want workup. CT oct 2013: Areas of bronchiectasis and scarring in the lingula and anterior right middle lobe. Pulmonary nodule: RLL Oct 2013: stable since oct 2012  10/01/2012 Acute OV  Complains of increased SOB, wheezing, prod cough with cream/gray/pea-green mucus, fever up to 100 x6 days -  reports was cleaning mildew outside the day before.  was given levaquin and pred taper by MR 11/4 via phone msg No hemoptyiss , chest pain or edema.  Mistakenly took dog's allergy med this am. Poison control and PCP/Vet said she should be fine.  No otc used .    REC CMA/RN will walk you for oxygen levels right now  Let us try pulmicort 186mcg, 2puff twice daily - take few samples and learn technique. Will send in script as well  REturn in 4-6 weeks with pft testing  Cough score at followup  OV 10/21/2012   Fu dyspnea and cough after starting pulmicort. She feels both have improved around 75%. "Yucky" yellow sputum is resolved. She is no longer embarrassed about visiting her mother in law in SNF. Current RSI cough score is 9 and reflects improvement post pulmicort; we do not have baseline value. Kouffman cough differentiator score is 0 for GERD and 1 for airway/neurogenic. PFT 10/21/2012 below: shows miild-moderate obstriuction with sig BD responmse and normal DLCO (like an asthmatic). She is hesitant to continue ICS due to risk of accelerated bone loss but after long discussion agreed. She agrees to add LABA Rx and therefore will  switch from pulmicort to symbicort       REC You have asthma or your bronchiectasis making you behave like an asthmatic PFT still shows obstruction at moderate severity despite pulmicort So stop pulmicort Please start symbicort 80/4.5 2 puff twice daily - take sample, script and show technique Use albuterol as needed Have pneumovax 10/21/2012 Glad you had flu shot Return in 3 months or sooner if needed   OV 02/12/2014  Chief Complaint  Patient presents with  . Asthma    pt states she has SOB with activity, and an occasional produtive cough with green phlegm.     Follow dyspnea and cough in the setting of obstructive lung disease related to asthma/bronchiectasis  I'm not seen in nearly a year and a half. She continues to do well. No interim problems.  Cough and dyspnea minimal. No interim hospitalizations, urgent care visits, emergency room visits or new medical diagnoses. No changes in her social situation other than the fact that her mother-in-law passed away. She wants to cut down her inhale corticosteroid use because of ongoing osteoporosis and concerns of that getting worse. She is interested in Prevnar      Subjective:    Patient ID: Crystal Bass, female    DOB: 1941/03/20, 78 y.o.   MRN: [id number]  PCP [name], MD  HPI    IOV 09/04/2018  Chief Complaint  Patient presents with  . Consult    Returning due to SOB increasing.      Crystal Bass 78 y.o. female  -last seen in 2015.  It is been over 3 years and is actually for years.  Therefore it is a new visit.  She tells me that she has had worsening shortness of breath in the last 1 year particularly since November 2018.  Also increase in cough but mostly shortness of breath worse with exertion and relieved by rest.   Symptm severity is listed blow - CAT score 32 and high symptomb uren The major changes to her life is that she was taking care of her mother-in-law with Alzheimer's and then she passed away.  She herself has been diagnosed with Parkinson's with only tremors.  Well-controlled with carbidopa.  She is now living in an apartment kind of situation in [place].  There is no cancer diagnosis or recurrence of prior cancers.  She continues with Flovent.  There is no chest pain orthopnea proximal nocturnal dyspnea.  She ended up in the ER late September 2019 and was diagnosed as "COPD exacerbation".  And was given 4 days prednisone and then released.  She says it only made a somewhat better.  Review of the chart indicates moderate obstructive lung function deficit with bronchodilator response and a normal DLCO but this was in 2013.  Blood labs on August 21 2018 shows creatinine 0.88 mg and hemoglobin 13.4 g%.  Noted she is on a beta 1 specific  beta blocker called Lopressor.    reports that she has never smoked. She has never used smokeless tobacco.   has a past medical history of Aneurysm of aorta ([facility]), Aortic root aneurysm ([place]), Arthritis, Asthma, Breast cancer ([place]) (2002), Cataract (2019), COPD (chronic obstructive pulmonary disease) ([facility]) (9/08), Ductal carcinoma of breast, estrogen receptor positive, stage 1 ([place]) (09/16/2012), Endometriosis, GERD (gastroesophageal reflux disease), Hypercalcemia (09/14/2013), Hypertension, Lesion of breast (1992), Lung disease, Mastalgia (7/95), Osteoporosis, post-menopausal (03/14/2012), Ovarian cancer ([place]) (2004), Pancreatitis, Parkinson disease ([place]) (02/10/15), Post-thoracotomy pain syndrome, Pseudomonas pneumonia ([facility]) (5/09), Uterine fibroid, and Vitamin D deficiency.   10/02/2018 Patient presents today for 1 month follow-up. Breathing is about the same, cough with mild production. Sob especially with activity. Associated chest tightness and wheezing. These symptoms have been going on for some time, worse over the last year. Hasn't noticed a difference with Spiriva. She has been on Flovent for awhile, recently feels it has been causing chest tightness and sob after taking medication but eases after some time. She is not interested in trying another ICS inhaler right now. Continues with allergy shots with Dr. Donneta Romberg. Consideration for adding newer generation biologic like Xolair or Dupixent. Needs to complete PFTs.     Significant testing: 09/04/18- FENO 35 09/04/18- IgE 85 10/01/2018 - Eos absolute 0.2  09/04/18 - Alpha-1 antitrypsin- 144 (within normal range) CXR showed chronic stable bilateraler bronchiectasis. Stable scar in RUL unchanged since 2012    OV 12/02/2018  Subjective:  Patient ID: TELLY BROBERG, female , DOB: August 10, 1941 , age 26 y.o. , MRN: [id number] , ADDRESS: [address] [place] [address]   12/02/2018 -   Chief Complaint  Patient presents with  .  Follow-up    Pt states she is about the same since last visit. Pt had to have the PFT cancelled due to a PE. Pt does have complaints of SOB with exertion and occ cough.     HPI ANDREAL VULTAGGIO 78 y.o. -patient with known bronchiectasis/allergic asthma on allergy shots and inhaled steroid.  Here for worsening shortness of breath  I last saw her November 2019.  Since then she says she is got continued worsening shortness of breath.  She is failed an inhaler check  change.  In addition she failed a change to specific beta-1 beta-blocker.  Also she got admitted on October 26, 2018 with single lobe pulmonary embolism.  Doppler legs was negative for DVT.  She is on Eliquis since then but this also has not helped her shortness of breath.  She feels that she had chest tightness at the time of PE.  She feels worsening shortness of breath in the last 1 year is unrelated.  October 2019 she had cardiac echo that shows only grade 1 diastolic dysfunction.  She has not had a stress test.  In December 2019 when she had CT chest she had some coronary artery calcifications.  But she denies having had a cardiac stress test.  Dyspnea is present on exertion relieved by rest.  Walking desaturation test 0.50 feet x 3 laps.  Resting pulse ox 96%.  Resting heart rate 58/min.  Final pulse ox 95%.  Final heart rate 67/min.  Did not desaturate.  Only had a modicum of a heart rate response.  In terms of her [name].  She says this was treated in 2002 for 6 months by Dr. Megan Salon and then sometime later was treated again for another 6 months.  Both treatments dealt by the infectious disease specialist.  Since then she feels the [name] is not recurred.  However the current CT chest in December 2019 does show increased pulmonary infiltrates consistent with recurrent/worsening [name].  Her last PFT was in 2013.  Current PFT was on hold because of the recent PE.      CAT COPD Symptom & Quality of Life Score ([place] trademark) 0 is no burden.  5 is highest burden 09/04/2018  12/02/2018   Never Cough -> Cough all the time 4 2  No phlegm in chest -> Chest is full of phlegm 3 2  No chest tightness -> Chest feels very tight 4 1  No dyspnea for 1 flight stairs/hill -> Very dyspneic for 1 flight of stairs 5 3  No limitations for ADL at home -> Very limited with ADL at home 5 4  Confident leaving home -> Not at all confident leaving home 4 1  Sleep soundly -> Do not sleep soundly because of lung condition 5 0  Lots of Energy -> No energy at all 5 5  TOTAL Score (max 40)  32 18       PFTs 10/21/2012   PFT FVC fev1 ratio BD fev1 TLC DLCO comments  10/21/2012  post pulmicort 2.L/78% 1.35L/69% 64/down% 12% improvement in FEv1 to 1.5L 3.9L/85% 14.8/82% Mild Obstruction with posiitive BD response; Normal DLco - c/w asthmatic response    ROS - per HPI     has a past medical history of Aneurysm of aorta ([facility]), Aortic root aneurysm ([place]), Arthritis, Asthma, Breast cancer ([facility]) (2002), Cataract (2019), COPD (chronic obstructive pulmonary disease) ([facility]) (9/08), Ductal carcinoma of breast, estrogen receptor positive, stage 1 ([facility]) (09/16/2012), Endometriosis, GERD (gastroesophageal reflux disease), Hypercalcemia (09/14/2013), Hypertension, Lesion of breast (1992), Lung disease, Mastalgia (7/95), Osteoporosis, post-menopausal (03/14/2012), Ovarian cancer ([facility]) (2004), Pancreatitis, Parkinson disease ([facility]) (02/10/15), Post-thoracotomy pain syndrome, Pseudomonas pneumonia ([facility]) (5/09), Uterine fibroid, and Vitamin D deficiency.   reports that she has never smoked. She has never used smokeless tobacco.  Past Surgical History:  Procedure Laterality Date  . 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 APPENDECTOMY  2004  . BREAST BIOPSY  08/2004   right breast-benign  . BREAST  LUMPECTOMY  1992   benign  . CARDIAC CATHETERIZATION  2007   essentially negation for significant CAD  . CHOLECYSTECTOMY  2001    . COLONOSCOPY  2014   Dr [name] , due 2019  . ESOPHAGOGASTRODUODENOSCOPY  09/2018   GERD with esophagitis and stricture dilated, antral gastritis negative for H pylori [name])  . LUNG BIOPSY  2002   [name], Dr [name]  . MASTECTOMY MODIFIED RADICAL Left 2002   oral chemotheraphy (tamoxifen then Armidex) no radiation, Dr.[name]  . TONSILLECTOMY  1946    Allergies  Allergen Reactions  . Sulfonamide Derivatives Anaphylaxis  . Topamax [Topiramate] Other (See Comments)    Metabolic acidosis   . Biaxin [Clarithromycin] Other (See Comments)    pericarditis  . Hydrocodone Other (See Comments)    "hyper and climbing the walls"  . Motrin [Ibuprofen] Other (See Comments)    headaches  . Symbicort [Budesonide-Formoterol Fumarate] Other (See Comments)    02/07/15 tremor  . Percocet [Oxycodone-Acetaminophen] Itching and Rash  . Tape Itching and Rash    Use paper tape only  . Tetracyclines & Related Other (See Comments)    "immediate yeast infection"    Immunization History  Administered Date(s) Administered  . Influenza Split 09/11/2011  . Influenza Whole 09/09/2007, 08/08/2008, 08/15/2010, 08/26/2012  . Influenza,inj,Quad PF,6+ Mos 08/18/2013, 08/20/2014, 08/25/2018  . Influenza-Unspecified 09/27/2015, 08/26/2017  . Pneumococcal Conjugate-13 02/12/2014  . Pneumococcal Polysaccharide-23 11/26/2006, 10/21/2012  . Td 01/01/2011  . Zoster 05/26/2007  . Zoster Recombinat (Shingrix) 02/22/2018    Family History  Problem Relation Age of Onset  . Lung cancer Father 39       d.82 history of smoking  . Breast cancer Sister 30  . Colon cancer Sister   . Emphysema Mother        d.64 was never a smoker  . Stroke Paternal Aunt        in mid 30s  . Osteoporosis Paternal Aunt   . Melanoma Maternal Aunt   . Cancer Maternal Uncle        unspecified type  . Cancer Maternal Grandmother        d.82s unspecified GI cancer  . Cancer Maternal Grandfather        d.62s unspecified type  .  Liver cancer Maternal Aunt   . Cancer Maternal Uncle        unspecified type  . Cancer Maternal Uncle        unspecified type  . Myasthenia gravis Paternal Aunt   . Breast cancer Cousin        d.60s-daughter of unaffected paternal aunt Eustace Moore  . Colon cancer Cousin 87       d.70-daughter of unaffected maternal aunt Leila  . Lung cancer Cousin 70       d.70-sisters to each other, both daughters of maternal uncle Johnny  . Diabetes Neg Hx   . Heart disease Neg Hx   . Stomach cancer Neg Hx   . Ulcerative colitis Neg Hx      Current Outpatient Medications:  .  acetaminophen (TYLENOL) 500 MG tablet, Take 2 tablets (1,000 mg total) by mouth 2 (two) times daily as needed. (Patient taking differently: Take 1,000 mg by mouth 2 (two) times daily as needed for mild pain or moderate pain. ), Disp: , Rfl:  .  Albuterol Sulfate ([name]) 573 (90 Base) MCG/ACT AEPB, Inhale 2 puffs into the lungs every 6 (six) hours as needed (dyspnea, wheezing)., Disp: , Rfl:  .  apixaban (  ELIQUIS) 5 MG TABS tablet, Take 1 tablet (5 mg total) by mouth 2 (two) times daily., Disp: 60 tablet, Rfl: 1 .  bisoprolol (ZEBETA) 5 MG tablet, Take 1 tablet (5 mg total) by mouth daily., Disp: 30 tablet, Rfl: 6 .  carbidopa-levodopa ([name]) 25-100 MG tablet, TAKE 2 TABLETS IN THE MORNING, 1 TABLET IN THE AFTERNOON, 1 IN THE EVENING, Disp: 360 tablet, Rfl: 0 .  clonazePAM (KLONOPIN) 0.5 MG tablet, TAKE 1/2 TABLET BY MOUTH EVERY DAY AT BEDTIME (Patient taking differently: Take 0.25 mg by mouth at bedtime. ), Disp: 45 tablet, Rfl: 0 .  fluticasone ([name]) 110 MCG/ACT inhaler, Inhale 2 puffs into the lungs 2 (two) times daily., Disp: , Rfl:  .  gabapentin ([name]) 300 MG capsule, Take 2 capsules (600 mg total) by mouth 3 (three) times daily., Disp: 540 capsule, Rfl: 3 .  omeprazole (PRILOSEC) 20 MG capsule, Take 1 capsule (20 mg total) by mouth daily., Disp: 30 capsule, Rfl: 11 .  Vitamin D, Ergocalciferol,  ([name]) 50000 units CAPS capsule, TAKE 1 CAPSULE BY MOUTH EVERY WEEK (Patient taking differently: Take 50,000 Units by mouth every 7 (seven) days. ), Disp: 12 capsule, Rfl: 3 .  Fluticasone-Umeclidin-Vilant (TRELEGY ELLIPTA) 100-62.5-25 MCG/INH AEPB, Inhale 1 puff into the lungs daily., Disp: 1 each, Rfl: 0      Objective:   Vitals:   12/02/18 1159  BP: 120/70  Pulse: (!) 53  SpO2: 96%  Weight: 138 lb 12.8 oz (63 kg)  Height: 5\' 2"  (1.575 m)    Estimated body mass index is 25.39 kg/m as calculated from the following:   Height as of this encounter: 5\' 2"  (1.575 m).   Weight as of this encounter: 138 lb 12.8 oz (63 kg).  @WEIGHTCHANGE @  [employer]   12/02/18 1159  Weight: 138 lb 12.8 oz (63 kg)     Physical Exam  General Appearance:    Alert, cooperative, no distress, appears stated age - slightly older , Deconditioned looking - yes mildly , OBESE  - no, Sitting on Wheelchair -  no  Head:    Normocephalic, without obvious abnormality, atraumatic  Eyes:    PERRL, conjunctiva/corneas clear,  Ears:    Normal TM's and external ear canals, both ears  Nose:   Nares normal, septum midline, mucosa normal, no drainage    or sinus tenderness. OXYGEN ON  - no . Patient is @ ra   Throat:   Lips, mucosa, and tongue normal; teeth and gums normal. Cyanosis on lips - no  Neck:   Supple, symmetrical, trachea midline, no adenopathy;    thyroid:  no enlargement/tenderness/nodules; no carotid   bruit or JVD  Back:     Symmetric, no curvature, ROM normal, no CVA tenderness  Lungs:     Distress - no , Wheeze no, Barrell Chest - no, Purse lip breathing - no, Crackles - no   Chest Wall:    No tenderness or deformity.    Heart:    Regular rate and rhythm, S1 and S2 normal, no rub   or gallop, Murmur - no  Breast Exam:    NOT DONE  Abdomen:     Soft, non-tender, bowel sounds active all four quadrants,    no masses, no organomegaly. Visceral obesity - no  Genitalia:   NOT DONE  Rectal:   NOT  DONE  Extremities:   Extremities - normal, Has Cane - no, Clubbing - no, Edema - no  Pulses:  2+ and symmetric all extremities  Skin:   Stigmata of Connective Tissue Disease - no  Lymph nodes:   Cervical, supraclavicular, and axillary nodes normal  Psychiatric:  Neurologic:   Pleasant - yes, Anxious - no, Flat affect - no  CAm-ICU - neg, Alert and Oriented x 3 - yes, Moves all 4s - yes, Speech - normal, Cognition - intact           Assessment:       ICD-10-CM   1. Dyspnea on exertion R06.09   2. History of [name] infection Z86.19   3. History of asthma Z87.09   4. History of bronchiectasis Z87.09        Plan:     Patient Instructions     ICD-10-CM   1. Dyspnea on exertion R06.09   2. History of [name] infection Z86.19   3. History of asthma Z87.09   4. History of bronchiectasis Z87.09     It is possible that her shortness of breath is worse because of recurrence/worsening [name] However, would like to exclude worsening bronchiectasis, or cardiac issues particularly related to coronary artery calcification  Plan -Stop current inhaler therapy -Start empiric Trelegy inhaler once daily take 2 or 3 samples -Use albuterol as needed -I have sent a message to Dr. Fletcher Anon to consider cardiac stress test -Do pulmonary function test in 1 month  Follow-up -In 1 month but after completing the above; based on the above we will consider infectious disease referral versus doing a bronchoscopy with lavage first to look for recurrent [name].   > 50% of this > 25 min visit spent in face to face counseling or coordination of care - by this undersigned MD - Dr Brand Males. This includes one or more of the following documented above: discussion of test results, diagnostic or treatment recommendations, prognosis, risks and benefits of management options, instructions, education, compliance or risk-factor reduction   SIGNATURE    Dr. Brand Males, M.D., F.C.C.P,  Pulmonary and Critical  Care Medicine Staff Physician, [facility] Director - Interstitial Lung Disease  Program  Pulmonary [place] at [place], [place], [address]  Pager: [phone], If no answer or between  15:00h - 7:00h: call [phone] Telephone: [phone]  12:48 PM 12/02/2018

## 2018-12-02 NOTE — Patient Instructions (Addendum)
ICD-10-CM   1. Dyspnea on exertion R06.09   2. History of [name] infection Z86.19   3. History of asthma Z87.09   4. History of bronchiectasis Z87.09     It is possible that her shortness of breath is worse because of recurrence/worsening [name] However, would like to exclude worsening bronchiectasis, or cardiac issues particularly related to coronary artery calcification  Plan -Stop current inhaler therapy -Start empiric Trelegy inhaler once daily take 2 or 3 samples -Use albuterol as needed -I have sent a message to Dr. Fletcher Anon to consider cardiac stress test -Do pulmonary function test in 1 month  Follow-up -In 1 month but after completing the above; based on the above we will consider infectious disease referral versus doing a bronchoscopy with lavage first to look for recurrent [name].

## 2018-12-02 NOTE — Telephone Encounter (Signed)
Hi Crystal Bass continues to complain of worsening dyspnea on exertion despite PE treatment.  And also despite switching her to a specific beta-1 beta-blocker bisoprolol.  And inhaler changes.  Her recent CT chest December 2019 showed coronary artery calcification.  Therefore please consider a cardiac stress test.  Also question for you is that how significant is a diastolic dysfunction and what role is it playing in her dyspnea  It is quite possible that her dyspnea is multifactorial and related to physical deconditioning and [name] in the lung   Thank you  MR    SIGNATURE    Dr. Brand Males, M.D., F.C.C.P,  Pulmonary and Critical Care Medicine Staff Physician, [facility] Director - Interstitial Lung Disease  Program  Pulmonary [place] at [place], [place], [address]  Pager: [phone], If no answer or between  15:00h - 7:00h: call [phone] Telephone: [phone]  12:41 PM 12/02/2018

## 2018-12-04 ENCOUNTER — Telehealth: Payer: Self-pay | Admitting: *Deleted

## 2018-12-04 NOTE — Progress Notes (Signed)
Crystal Bass was seen today in the movement disorders clinic for neurologic consultation at the request of [name], MD.  The consultation is for the evaluation of tremor.  Pt states that it has been going on over a year.  It is mostly in the R hand and rarely in the L hand.  "It is almost constant but not quite."  She notices it when grasping the steering wheel.  Eating was troublesome but better now that d/c the symbicort.  Symbicort worsened tremor by 80%.  Paternal aunt with tremor.  05/17/15 update:  Last visit, I diagnosed the patient with Parkinson's disease and started her on carbidopa/levodopa 25/100, one tablet 3 times per day.  She states that for the first 6 weeks she had diarrhea but it got better and it went away.  She does think that the tremor is better but does know that stress will trigger the tremor.  She also states that speech therapy seems to set off tremor.  She was started in physical therapy at [facility].  She has noted that she is waking up from dreams yelling and acting out dreams.  She does have a long history of migraine headaches and last visit had wanted me to prescribe Imitrex.  I was very nervous that her age and did not feel that she was likely a triptan candidate.  I ended up ordering an MRI of the brain on 02/28/2015.  I reviewed this and there was evidence of moderate small vessel disease.  I subsequently told her that she was not a candidate for the triptans.  She also had an MRI of the cervical spine on a performed, 2016.  There was mild multilevel cervical spondylosis.  Unfortunately, she has had several headaches since last visit that were fairly severe.  She did see Dr. [name] since last visit in regards to dysphagia and she states that it was scheduled but she ended up having to reschedule it.  08/23/15 update:  The patient is following up today.  She remains on carbidopa/levodopa 25/100, one tablet 3 times per day (8:30am/2pm/10PM).  She does have some  diarrhea that she associates with taking the pill but feels it is better than when she first started taking it.  She also states that it takes her until mid afternoon to feel normal and just feels slow in the morning.  She was started on clonazepam last visit for REM behavior disorder and that has helped dramatically.  She has had some spells of weakness in which her legs give out.  It may happen a few times a week.  She does not relate that to the addition of clonazepam at all.  No falls.  No hallucinations.  She was exercising until her back started hurting again.   In regards to her migraines, I gave her a prescription for Cambia last visit.  She is not a candidate for the triptans any longer.  She picked it up but hasn't had to use it yet.  She is on neurontin 600 mg bid for post surgical pain (states that she had mastectomy and open lung biopsy and then had terrible paresthesias).  Is still like that if doesn't take the neurontin, especially if she has a bra on.  She does complain that she thinks she is not thinking as clear as she used to.  11/28/14 update:  The patient is following up today.  Last visit, I increased her carbidopa/levodopa so that she is taking carbidopa/levodopa 25/100, 2  tablets in the morning, one in the afternoon and one in the evening.  I was hoping that this would help her morning "on."  Today, the patient states that she just has no energy and she is unsure if it is related to the medication; it started before the medication increase.  She thinks that the energy does get a bit better throughout the day.  She is on the klonopin and "I don't want to give that up as I don't dream anymore."  She has so little energy that she has not exercised much.  Does want to go back to PT at [facility].  She is not snoring at night that she knows of.  She's had no falls since last visit.  No hallucinations.  No lightheadedness or near syncope.  She has a has a history of rare migraine.  She does have  Cambia at home but has not had to use that.  She had a "bad" headache last night but got rid of it with tylenol, aleve, and coke.  Also c/o issues with BM's - both constipation and diarrhea.  03/01/16 update: The patient is following up today regarding her Parkinson's disease.  I have had the opportunity to review her prior records made available to me.  I last visit, she was complaining about loss of energy.  It was not sure that this was really related to her medications, but we decided to strategically hold medications.  She held her levodopa for about 4 days, because she thought that that was the culprit.  She felt no different in terms of energy off of the medication and ended up going back on the carbidopa/levodopa 25/100, but for some reason she went from 2 in the morning, one in the afternoon and one in the evening to 1 tablet three times a day (states that she didn't think that the higher dose made a difference).  We subsequently held her clonazepam and her energy may have been a little bit better, but she felt that it was certainly not worth being off of the medication, because she has terrible dreams and acting out the dreams off of the medications.  We decided subsequently to that selegiline for its amphetamine-like properties, but she decided not to take the medication after it was prescribed.  She reported that she wanted to try to increase her energy through exercise.  She is doing the BIG/LOUD exercises at home and walks some.  Brings in a [facility] form for exercise today.  No hallucination but rare visual distortion.  She has had some headaches but hasn't tried the cambia.    06/01/16 update:  The patient is following up today.  Last visit, I encouraged the patient to increase her levodopa back to 2 tablets in the morning, one in the afternoon and one evening, because every time she decreased it she became more rigid.  She states that she did that and she is doing "okay."  She also has a history  of REM behavior disorder and is on half a tablet of clonazepam at nighttime.  She denies falls since last visit.  No hallucinations.  May have occasional lightheadedness but no near syncope.  Thinks that she has "muscle weakness" but points to the neck and states that it is painful and then will feel like the head will tumble off.  Asks me about "posture PT" at [facility].    10/02/16 update:  Pt f/u today.  Pt is on carbidopa/levodopa 25/100, 2 in  the AM, 1 in the afternoon and 1 in the evening.  Still on klonopin 0.5 mg, 1/2 tablet q hs.  No falls since our last visit.  No hallucinations.  No increased lightheadedness.  No syncope.  Mood has been fair.  Doing at least 1.25 miles per day on her [place] for exercise.  02/05/17 update:  Patient follows up today.  She is on carbidopa/levodopa 25/100, 2 tablets in the morning, one in the afternoon and one in the evening.  Reports that she has been doing well since our last visit.  Minimal tremor.  No falls.  No lightheadedness or near syncope.  She takes clonazepam, 0.5 mg, half tablet at night and does well with this.  She sleeps well.  No acting out of the dreams.  When asked about exercise, she states that "I was doing well until winter hit."  In regards to headache, she was having more than usual and was taking 4 tylenol per day but she stopped doing that.    06/12/17 update:  Patient seen in follow-up regarding her Parkinson's disease.  She remains on carbidopa/levodopa 25/100, 2 tablets in the morning, one in the afternoon and one in the evening.  She is also on clonazepam, 0.5 mg, half tablet at night.  She has had a few occasions of yelling out in the night.  Pt denies falls.  Pt has occasional lightheadedness and she will need to sit.  Its worse after she sits.    No hallucinations.  Mood has been good.  States that she is most concerned about cognition.  She will forget what she is saying in the middle of a sentence.  She is having some trouble with  forgetting names.  She puts her pills on the counter so she can see them and therefore she remembers them.  She remembers to pay bills.  She has no trouble driving.    10/25/17 update: Patient is seen in follow-up today regarding her Parkinson's disease.  She is on carbidopa/levodopa 25/100, 2/1/1.  Pt denies falls but she states that she has been very careful.  She will occasionally feel like her legs give way.  She is not exercising because she feels weak from the time she gets up until about 1-2pm.    Pt denies lightheadedness, near syncope.  No hallucinations.  Mood has been good.  She remains on clonazepam 0.5 mg, half a tablet at night for REM behavior disorder.  She has not had any acting out of the dreams or falling out of bed.  She has had one episode of screaming in the middle of the night.  She is having a problem with sleep.  they moved her at [facility] from her apartment to a new apartment.  She is close to industrial dryers that go all night and interrupt her sleep. The records that were made available to me were reviewed.  She saw Dr. Si Raider and had neurocognitive testing on August 15, 2017.  She has reviewed the results with Dr. Si Raider.  There is no evidence of cognitive impairment.  There is no evidence of a psychiatric disorder.    02/27/18 update: Patient is seen today in follow-up for Parkinson's disease.  She is on carbidopa/levodopa 25/100, 2 tablets in the morning, 1 in the afternoon and 1 in the evening.  She is still very tired in the AM.  She has trouble deciphering if weakness or fatigue.  Its a chore to take a shower.  No falls since  last visit.  No lightheadedness or near syncope.  She is on clonazepam 0.5 mg, half tablet at night for REM behavior disorder.  This is working well.  No acting out of dreams.  She does have pleasant dreams Records have been reviewed since our last visit.  She has been to outpatient therapy.  07/17/18 update: Patient is seen today in follow-up for  Parkinson's disease.  Patient is on carbidopa/levodopa 25/100, 2 tablets in the morning, 1 in the afternoon and 1 in the evening. She is having lightheadedness and notes that sometimes when she stands BP is in the 01'U systolic! She doesn't feel good when that happens.  Tried to go off one BP med in past and BP was 272Z systolic.  has low energy. Clonazepam is working well for her REM behavior disorder.  She has not had any acting out of the dreams.  Records have been reviewed since our last visit.  She was admitted to the hospital in May after a fall.  She reports that she had been sitting down on her leg and her leg was asleep and she fell because of it.   X-ray of the knee demonstrated minimally separated fracture involving the mid to inferior aspect of the patella.  Ankle x-ray demonstrated possible avulsion fracture.  She has had ongoing knee pain since.  She saw orthopedics on June 09, 2018 and I reviewed those records.  She was doing better at that visit with knee pain and was told that she could come out of her hinged knee brace.  She declined steroid injections of the knee.  It is better than it was but it is still having some pain.  She is not back to exercise yet.    12/05/18 update: Patient is seen today in follow-up for Parkinson's disease.  Patient is on carbidopa/levodopa 25/100, 2 tablets in the morning(8am), 1 in the afternoon (2pm) and 1 in the evening (6pm).  Last visit, she was having significant orthostatic symptoms and ultimately I talked to her primary care physician and losartan was discontinued.  She reports that dizziness is still "as bad as ever."  She is on clonazepam for REM behavior disorder.  She is on 0.25 mg nightly.  She reports that this is working well.  She has not been acting out any of her dreams.  She has had no falls since the May fall but knee hurts since then.  The records that were made available to me were reviewed since last visit.  She was in the emergency room on  August 21, 2018 with shortness of breath while at her allergist's office.  Work-up was negative.  When she followed up with primary care, it was noted that her blood pressure was somewhat elevated and her primary care physician was aware of her history of orthostasis, but also wanted better blood pressure control in the setting of known aortic root dilation.  She was referred, therefore, to cardiology.  She was seen on September 16, 2018.  Abdominal compression binder was recommended.  She didn't do that.  She was seen on the ER on November 30 with nonspecific chest pain.  She was ultimately admitted with a diagnosis of pulmonary embolism.  Patient is now on Eliquis.  On gabapentin 600 mg tid for neuropathy post mastectomy.  She feels if she doesn't take it she has pain.  She does c/o EDS.  Sleepiness is mostly in the AM.  Takes levodopa and gabapentin at same time of day  PREVIOUS MEDICATIONS: none to date  ALLERGIES:   Allergies  Allergen Reactions  . Sulfonamide Derivatives Anaphylaxis  . Topamax [Topiramate] Other (See Comments)    Metabolic acidosis   . Biaxin [Clarithromycin] Other (See Comments)    pericarditis  . Hydrocodone Other (See Comments)    "hyper and climbing the walls"  . Motrin [Ibuprofen] Other (See Comments)    headaches  . Symbicort [Budesonide-Formoterol Fumarate] Other (See Comments)    02/07/15 tremor  . Percocet [Oxycodone-Acetaminophen] Itching and Rash  . Tape Itching and Rash    Use paper tape only  . Tetracyclines & Related Other (See Comments)    "immediate yeast infection"    CURRENT MEDICATIONS:  Outpatient Encounter Medications as of 12/05/2018  Medication Sig  . acetaminophen (TYLENOL) 500 MG tablet Take 2 tablets (1,000 mg total) by mouth 2 (two) times daily as needed. (Patient taking differently: Take 1,000 mg by mouth 2 (two) times daily as needed for mild pain or moderate pain. )  . Albuterol Sulfate ([name]) 161 (90 Base) MCG/ACT AEPB  Inhale 2 puffs into the lungs every 6 (six) hours as needed (dyspnea, wheezing).  [name] apixaban (ELIQUIS) 5 MG TABS tablet Take 1 tablet (5 mg total) by mouth 2 (two) times daily.  . bisoprolol (ZEBETA) 5 MG tablet Take 1 tablet (5 mg total) by mouth daily.  . carbidopa-levodopa ([name]) 25-100 MG tablet TAKE 2 TABLETS IN THE MORNING, 1 TABLET IN THE AFTERNOON, 1 IN THE EVENING  . clonazePAM (KLONOPIN) 0.5 MG tablet TAKE 1/2 TABLET BY MOUTH EVERY DAY AT BEDTIME (Patient taking differently: Take 0.25 mg by mouth at bedtime. )  . fluticasone ([name]) 110 MCG/ACT inhaler Inhale 2 puffs into the lungs 2 (two) times daily.  . Fluticasone-Umeclidin-Vilant (TRELEGY ELLIPTA) 100-62.5-25 MCG/INH AEPB Inhale 1 puff into the lungs daily.  [name] gabapentin ([name]) 300 MG capsule Take 2 capsules (600 mg total) by mouth 3 (three) times daily.  [name] omeprazole (PRILOSEC) 20 MG capsule Take 1 capsule (20 mg total) by mouth daily.  . Vitamin D, Ergocalciferol, ([name]) 50000 units CAPS capsule TAKE 1 CAPSULE BY MOUTH EVERY WEEK (Patient taking differently: Take 50,000 Units by mouth every 7 (seven) days. )   No facility-administered encounter medications on file as of 12/05/2018.     PAST MEDICAL HISTORY:   Past Medical History:  Diagnosis Date  . 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 Aortic root aneurysm ([place])   . Arthritis   . Asthma   . Breast cancer ([place]) 2002   infiltrative ductal carcinoma   . Cataract 2019  . COPD (chronic obstructive pulmonary disease) ([place]) 9/08  . Ductal carcinoma of breast, estrogen receptor positive, stage 1 ([place]) 09/16/2012  . Endometriosis   . GERD (gastroesophageal reflux disease)   . Hypercalcemia 09/14/2013  . Hypertension   . Lesion of breast 1992   right, benign  . Lung disease    secondary to [name] infection  . Mastalgia 7/95  . Osteoporosis, post-menopausal 03/14/2012  . Ovarian cancer ([place]) 2004  . Pancreatitis    secondary to  Cholelithiasis  . Parkinson disease ([place]) 02/10/15  . Post-thoracotomy pain syndrome   . Pseudomonas pneumonia ([place]) 5/09  . Uterine fibroid   . Vitamin D deficiency     PAST SURGICAL HISTORY:   Past Surgical History:  Procedure Laterality Date  . 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 APPENDECTOMY  2004  . BREAST BIOPSY  08/2004   right breast-benign  . BREAST LUMPECTOMY  1992   benign  . CARDIAC CATHETERIZATION  2007   essentially negation for significant CAD  . CHOLECYSTECTOMY  2001  . COLONOSCOPY  2014   Dr [name] , due 2019  . ESOPHAGOGASTRODUODENOSCOPY  09/2018   GERD with esophagitis and stricture dilated, antral gastritis negative for H pylori [name])  . LUNG BIOPSY  2002   [name], Dr [name]  . MASTECTOMY MODIFIED RADICAL Left 2002   oral chemotheraphy (tamoxifen then Armidex) no radiation, Dr.[name]  . TONSILLECTOMY  1946    SOCIAL HISTORY:   Social History   Socioeconomic History  . Marital status: Widowed    Spouse name: Not on file  . Number of children: 0  . Years of education: Not on file  . Highest education level: Not on file  Occupational History  . 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 Financial resource strain: Not on file  . Food insecurity:    Worry: Not on file    Inability: Not on file  . Transportation needs:    Medical: Not on file    Non-medical: Not on file  Tobacco Use  . Smoking status: Never Smoker  . Smokeless tobacco: Never Used  Substance and Sexual Activity  . Alcohol use: No    Alcohol/week: 0.0 standard drinks  . Drug use: No  . Sexual activity: Never    Birth control/protection: Surgical    Comment: TAH/BSO  Lifestyle  . Physical activity:    Days per week: Not on file    Minutes per session: Not on file  . Stress: Not on file  Relationships  . Social connections:    Talks on phone: Not on file    Gets  together: Not on file    Attends religious service: Not on file    Active member of club or organization: Not on file    Attends meetings of clubs or organizations: Not on file    Relationship status: Not on file  . Intimate partner violence:    Fear of current or ex partner: Not on file    Emotionally abused: Not on file    Physically abused: Not on file    Forced sexual activity: Not on file  Other Topics Concern  . 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 Father  Deceased       lung cancer  . Sister  Alive       colon/breast cancer  . Mother  Deceased       emphysema  . Brother  Alive       healthy  . Ethlyn Daniels  (Not Specified)  . Ethlyn Daniels  (Not Specified)  . [place] Deceased  . [place] Deceased  . MGM  Deceased  . MGF  Deceased  . PGM  Deceased  . PGF  Deceased  . [place] Deceased  . [place] Deceased  . [place] Deceased  .  Ethlyn Daniels  (Not Specified)  . [place] Deceased  . Cousin  Deceased  . Cousin Jinny Sanders and [place] Deceased  . Neg Hx  (Not Specified)    ROS:  Review of Systems  Constitutional: Negative.   HENT: Negative.   Eyes: Negative.   Respiratory: Negative.   Cardiovascular: Negative.   Gastrointestinal: Negative.   Genitourinary: Negative.   Musculoskeletal: Positive for joint pain (knee).  Skin: Negative.     PHYSICAL EXAMINATION:    VITALS:   Vitals:   12/05/18 1248  BP: 124/72  Pulse: 70  Weight: 137 lb (62.1 kg)  Height: 5' 2.5" (1.588 m)    GEN:  The patient appears stated age and is in NAD. HEENT:  Normocephalic, atraumatic.  The mucous membranes are moist. The superficial  temporal arteries are without ropiness or tenderness. CV:  RRR Lungs:  CTAB Neck/HEME:  There are no carotid bruits bilaterally.  Neurological examination:  Orientation: alert and oriented x 3 Montreal Cognitive Assessment  10/02/2016  Visuospatial/ Executive (0/5) 4  Naming (0/3) 3  Attention: Read list of digits (0/2) 2  Attention: Read list of letters (0/1) 1  Attention: Serial 7 subtraction starting at 100 (0/3) 3  Language: Repeat phrase (0/2) 2  Language : Fluency (0/1) 1  Abstraction (0/2) 2  Delayed Recall (0/5) 3  Orientation (0/6) 6  Total 27  Adjusted Score (based on education) 27    Cranial nerves: There is good facial symmetry. The speech is fluent and clear. Soft palate rises symmetrically and there is no tongue deviation. Hearing is intact to conversational tone. Sensation: Sensation is intact to light touch throughout Motor: Strength is 5/5 in the bilateral upper and lower extremities.   Shoulder shrug is equal and symmetric.  There is no pronator drift.  Movement examination: Tone: There is normal tone in the RUE.  There is nl tone in the LUE.  There is nl tone in the RLE.  There is nl tone in the LLE.  Abnormal movements: none Coordination:  There is no decremation with RAM's, with any form of RAMS, including alternating supination and pronation of the forearm, hand opening and closing, finger taps, heel taps and toe taps. Gait and Station: The patient has no difficulty arising out of a deep-seated chair without the use of the hands. The patient's stride length is good.      LABS  Lab Results  Component Value Date   WBC 6.2 10/28/2018   HGB 12.0 10/28/2018   HCT 37.2 10/28/2018   MCV 97.6 10/28/2018   PLT 195 10/28/2018     Chemistry      Component Value Date/Time   NA 138 10/28/2018 1420   NA 141 03/28/2017 1130   K 3.9 10/28/2018 1420   K 4.2 03/28/2017 1130   CL 102 10/28/2018 1420   CL 102 03/10/2013 1529   CO2 26 10/28/2018 1420   CO2 28  03/28/2017 1130   BUN 17 10/28/2018 1420   BUN 17.3 03/28/2017 1130   CREATININE 0.96 10/28/2018 1420   CREATININE 0.8 03/28/2017 1130      Component Value Date/Time   CALCIUM 9.1 10/28/2018 1420   CALCIUM 9.6 03/28/2017 1130   ALKPHOS 83 10/25/2018 2225   ALKPHOS 85 03/28/2017 1130   AST 18 10/25/2018 2225   AST 12 03/28/2017 1130   ALT 10 10/25/2018 2225   ALT <6 03/28/2017 1130   BILITOT 1.2 10/25/2018 2225   BILITOT 1.38 (H) 03/28/2017 1130  ASSESSMENT/PLAN:  1.   idiopathic Parkinson's disease.  The patient has tremor, bradykinesia, rigidity and mild postural instability.  Sx's have been present for at least since March, 2015 by the patients account .    -due to EDS, change IR carbidopa/levodopa 25/100, to carbidopa/levodopa 25/100 CR, 2/1/1 2.  EDS  -offered selegeline but she wants to hold on that.  Will let me know if changes her mind (she was worried about SE)  -only in the AM.  Am changing carbidopa/levodopa to CR.    -wonder if related to gabapentin 600 mg tid for neuropathic pain but she is resistant to changing that.  Will call her in 2 weeks to see how doing with new formulation of levodopa 3.  Long hx migraine  -No longer a candidate for triptan therapy.  This is due to cerebral small vessel disease.  She does have Cambia for when necessary use.  Headaches have been better lately 5.  Mild orthostasis  -still has dizziness but won't wear compression binder.  Encouraged to do so  -f/u with cardiology 6.  Neck pain/hyperreflexia  -MRI cervical spine in April, 2016 just demonstrated mild multilevel cervical spondylosis. 7.  REM behavior disorder  -Doing well with low-dose clonazepam, 0.5 mg, half tablet at night.  Risks, benefits, side effects and alternative therapies were discussed.  The opportunity to ask questions was given and they were answered to the best of my ability.  The patient expressed understanding and willingness to follow the outlined treatment  protocols. 8.  Mild cognitive impairment  -She saw Dr. Si Raider and had neurocognitive testing on August 15, 2017.  She has reviewed the results with Dr. Si Raider.  There is no evidence of cognitive impairment, including dementia.  There is no evidence of a psychiatric disorder. 9.  Follow up is anticipated in the next few months, sooner should new neurologic issues arise.  Much greater than 50% of this visit was spent in counseling and coordinating care.  Total face to face time:  25 min.  This did not include the 40 min of record review which was detailed above, which was non face to face time.

## 2018-12-04 NOTE — Telephone Encounter (Signed)
Patient returning call ? Maybe to schedule testing ?

## 2018-12-04 NOTE — Telephone Encounter (Signed)
Call placed to the patient to discuss a possible TEE. The patient recently saw her pulmonologist who was concerned about her worsening dyspnea.  Per Dr. Fletcher Anon, the patient could consider a TEE.   The patient stated that she has been feeling this way for quite a while now and would prefer to discuss this with Dr. Fletcher Anon in person at her appointment on 12/23/2018.

## 2018-12-04 NOTE — Telephone Encounter (Signed)
Hi Murali, I reviewed her most recent echocardiogram.  The study overall was suboptimal so I think it was difficult to quantify the degree of mitral regurgitation she had but she definitely had significant mitral valve prolapse.  Pulmonary pressure could not be estimated accurately although it does not appear to be elevated.  I think we have to exclude the possibility that her mitral regurgitation might be severe and underestimated by transthoracic echocardiogram.  The best option to do that is to proceed with a transesophageal echocardiogram.  If her mitral regurgitation comes back severe, then the next step will be a right and left heart catheterization which should answer the question regarding her dyspnea.  Lattie Haw, Please discussed with the patient the need for a transesophageal echocardiogram.  This can be scheduled before her appointment with me later this month if she is agreeable.  If she wants to discuss more, I can address this with her during her visit.  Thanks.

## 2018-12-04 NOTE — Telephone Encounter (Signed)
Left a message for the patient to call back.  

## 2018-12-05 ENCOUNTER — Encounter: Payer: Self-pay | Admitting: Neurology

## 2018-12-05 ENCOUNTER — Ambulatory Visit: Payer: Medicare Other | Admitting: Neurology

## 2018-12-05 ENCOUNTER — Other Ambulatory Visit: Payer: Self-pay

## 2018-12-05 VITALS — BP 124/72 | HR 70 | Ht 62.5 in | Wt 137.0 lb

## 2018-12-05 DIAGNOSIS — G20 Parkinson's disease: Secondary | ICD-10-CM | POA: Diagnosis not present

## 2018-12-05 DIAGNOSIS — G4710 Hypersomnia, unspecified: Secondary | ICD-10-CM | POA: Diagnosis not present

## 2018-12-05 MED ORDER — CARBIDOPA-LEVODOPA ER 25-100 MG PO TBCR: EXTENDED_RELEASE_TABLET | ORAL | 4 refills | Status: DC

## 2018-12-05 NOTE — Patient Instructions (Signed)
1.  For right now, STOP the carbidopa/levodopa 25/100 IR that you have at home  2.  Start carbidopa/levodopa 25/100 CR (or ER), 2 tablets in the AM, 1 in the afternoon and 1 in the evening

## 2018-12-09 NOTE — Telephone Encounter (Signed)
[place]  Thanks much. Really appreciate the extra thought  MR

## 2018-12-15 ENCOUNTER — Telehealth: Payer: Self-pay | Admitting: Internal Medicine

## 2018-12-15 MED ORDER — FLUTICASONE-UMECLIDIN-VILANT 100-62.5-25 MCG/INH IN AEPB: 1.0000 | INHALATION_SPRAY | Freq: Every day | RESPIRATORY_TRACT | 5 refills | Status: DC

## 2018-12-15 NOTE — Telephone Encounter (Signed)
Spoke with pt. At her last OV, MR gave her a sample of Trelegy. Pt states that this working well for her and would like a prescription. This has been sent to her pharmacy. Pt is also wanting to know if MR thinks she should continue her allergy shots every week.  MR - please advise. Thanks.

## 2018-12-15 NOTE — Telephone Encounter (Signed)
LMTCB

## 2018-12-15 NOTE — Telephone Encounter (Signed)
trelegy working for her - good news  Allergy shots - for now yes  FYI - she should be getting  A call or probably already did from Dr Fletcher Anon to have repeat echo done

## 2018-12-15 NOTE — Telephone Encounter (Signed)
Pharm is [facility] on [address] in [place].

## 2018-12-16 NOTE — Telephone Encounter (Signed)
Spoke with pt. She is aware of MR's response. Nothing further was needed at this time. 

## 2018-12-23 ENCOUNTER — Ambulatory Visit: Payer: Medicare Other | Admitting: Cardiovascular Disease

## 2018-12-23 ENCOUNTER — Encounter: Payer: Self-pay | Admitting: Cardiovascular Disease

## 2018-12-23 VITALS — BP 111/71 | HR 59 | Ht 62.5 in | Wt 137.5 lb

## 2018-12-23 DIAGNOSIS — I10 Essential (primary) hypertension: Secondary | ICD-10-CM

## 2018-12-23 DIAGNOSIS — G903 Multi-system degeneration of the autonomic nervous system: Secondary | ICD-10-CM

## 2018-12-23 DIAGNOSIS — I340 Nonrheumatic mitral (valve) insufficiency: Secondary | ICD-10-CM | POA: Diagnosis not present

## 2018-12-23 DIAGNOSIS — I712 Thoracic aortic aneurysm, without rupture: Secondary | ICD-10-CM | POA: Diagnosis not present

## 2018-12-23 DIAGNOSIS — I7121 Aneurysm of the ascending aorta, without rupture: Secondary | ICD-10-CM

## 2018-12-23 MED ORDER — BISOPROLOL FUMARATE 5 MG PO TABS: 2.5000 mg | ORAL_TABLET | Freq: Every day | ORAL | 2 refills | Status: DC

## 2018-12-23 MED ORDER — APIXABAN 5 MG PO TABS: 5.0000 mg | ORAL_TABLET | Freq: Two times a day (BID) | ORAL | 0 refills | Status: DC

## 2018-12-23 NOTE — Patient Instructions (Addendum)
Medication Instructions:  Stop Eliquis after one month DECREASE the Bisoprolol to 2.5 mg daily (half a tablet)  If you need a refill on your cardiac medications before your next appointment, please call your pharmacy.   Testing/Procedures: Your physician has requested that you have a TEE. During a TEE, sound waves are used to create images of your heart. It provides your doctor with information about the size and shape of your heart and how well your heart's chambers and valves are working. In this test, a transducer is attached to the end of a flexible tube that's guided down your throat and into your esophagus (the tube leading from you mouth to your stomach) to get a more detailed image of your heart. You are not awake for the procedure. Please see the instruction sheet given to you today.   Follow-Up: At [facility], you and your health needs are our priority.  As part of our continuing mission to provide you with exceptional heart care, we have created designated Provider Care Teams.  These Care Teams include your primary Cardiologist (physician) and Advanced Practice Providers (APPs -  Physician Assistants and Nurse Practitioners) who all work together to provide you with the care you need, when you need it. You will need a follow up appointment in 2 months.  You may see Dr. Fletcher Anon or one of the following Advanced Practice Providers on your designated Care Team:   Murray Hodgkins, NP Christell Faith, PA-C . Marrianne Mood, PA-C     Any Other Special Instructions Will Be Listed Below (If Applicable).   Dear Crystal Bass,  You are scheduled for a TEE on Monday 12/29/2018  with Dr. Fletcher Anon at 6:30 .  Please arrive at the [facility] 1 hour prior to procedure.  DIET: Nothing to eat or drink after midnight except a sip of water with medications (see medication instructions below)  Medication Instructions: Nothing to hold  Continue your anticoagulant: Eliquis You will need to  continue your anticoagulant after your procedure until you are told by your provider that it is safe to stop   Labs: Your provider would like for you to have the following labs today: BMET and CBC   You must have a responsible person to drive you home and stay in the waiting area during your procedure. Failure to do so could result in cancellation.  Bring your insurance cards.  *Special Note: Every effort is made to have your procedure done on time. Occasionally there are emergencies that occur at the hospital that may cause delays. Please be patient if a delay does occur.

## 2018-12-23 NOTE — Progress Notes (Signed)
Cardiology Office Note   Date:  12/23/2018   ID:  Jaynia, Fendley Sep 16, 1941, MRN [id number]  PCP:  [name], MD  Cardiologist:   [name], MD   Chief Complaint  Patient presents with  . Other    3 month follow up. Patient was in the ED 10/2018 for chest pain. Patient c/o blood pressure dropping very low and has fallen due to this. Patient denies any chest pain at this time. Meds reviewed verbally with patient.       History of Present Illness: ANAISHA MAGO is a 78 y.o. female who is here today for follow-up visit regarding orthostatic hypotension and dyspnea.  The patient has known history of Parkinson's disease.  She was diagnosed with Parkinson's in 2015 but reports prolonged history of orthostatic dizziness. She used to be on multiple blood pressure medications.  Losartan was discontinued in September and metoprolol 25 mg twice daily was continued.  She continued to be orthostatic. She reports dyspnea with emergency room visit in September.  She has known history of COPD.  CT scan showed stable right lower lobe scarring and chronic bronchiectasis of the right lung.  She was treated with a course of prednisone.  The patient had prior cardiac catheterization in 2012 by Dr. [name] which showed normal coronary arteries and ejection fraction and normal renal arteries. There has been concern about decreasing her antihypertensive medications given the presence of an ascending aortic aneurysm at 4 cm.  Fortunately, this has been stable on serial imaging.   She was seen by Dr. Chase Caller for evaluation of cough and exertional dyspnea.  There is concern about possible underlying coronary artery disease given the presence of coronary artery calcification on previous CT imaging. Echocardiogram in October 2019 showed normal LV systolic function, grade 1 diastolic dysfunction, anterior mitral valve leaflet prolapse with moderate to severe regurgitation.  Left atrium was  moderately to severely dilated.    She was hospitalized in early December with atypical chest pain.  Initial CTA of the chest showed single small pulmonary embolism in the apical segmental branch of the left upper lobe.  There was also evidence of bronchiectasis.  Repeat CT after 2 days showed resolution of the pulmonary embolus.  The patient was started on anticoagulation with Eliquis.  She continues to have orthostatic dizziness with falling as a result.  She also continues to have significant shortness of breath.  No chest pain.  Past Medical History:  Diagnosis Date  . 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 Aortic root aneurysm ([place])   . Arthritis   . Asthma   . Breast cancer ([place]) 2002   infiltrative ductal carcinoma   . Cataract 2019  . COPD (chronic obstructive pulmonary disease) ([place]) 9/08  . Ductal carcinoma of breast, estrogen receptor positive, stage 1 ([place]) 09/16/2012  . Endometriosis   . GERD (gastroesophageal reflux disease)   . Hypercalcemia 09/14/2013  . Hypertension   . Lesion of breast 1992   right, benign  . Lung disease    secondary to [name] infection  . Mastalgia 7/95  . Osteoporosis, post-menopausal 03/14/2012  . Ovarian cancer ([place]) 2004  . Pancreatitis    secondary to Cholelithiasis  . Parkinson disease ([place]) 02/10/15  . Post-thoracotomy pain syndrome   . Pseudomonas pneumonia ([place]) 5/09  . Uterine fibroid   . Vitamin D deficiency     Past Surgical History:  Procedure Laterality Date  .  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 APPENDECTOMY  2004  . BREAST BIOPSY  08/2004   right breast-benign  . BREAST LUMPECTOMY  1992   benign  . CARDIAC CATHETERIZATION  2007   essentially negation for significant CAD  . CHOLECYSTECTOMY  2001  . COLONOSCOPY  2014   Dr [name] , due 2019  . ESOPHAGOGASTRODUODENOSCOPY  09/2018   GERD with esophagitis and stricture dilated, antral gastritis  negative for H pylori [name])  . LUNG BIOPSY  2002   [name], Dr [name]  . MASTECTOMY MODIFIED RADICAL Left 2002   oral chemotheraphy (tamoxifen then Armidex) no radiation, Dr.[name]  . TONSILLECTOMY  1946     Current Outpatient Medications  Medication Sig Dispense Refill  . acetaminophen (TYLENOL) 500 MG tablet Take 2 tablets (1,000 mg total) by mouth 2 (two) times daily as needed. (Patient taking differently: Take 1,000 mg by mouth 2 (two) times daily as needed for mild pain or moderate pain. )    . Albuterol Sulfate ([name]) 476 (90 Base) MCG/ACT AEPB Inhale 2 puffs into the lungs every 6 (six) hours as needed (dyspnea, wheezing).    [name] apixaban (ELIQUIS) 5 MG TABS tablet Take 1 tablet (5 mg total) by mouth 2 (two) times daily. 60 tablet 1  . bisoprolol (ZEBETA) 5 MG tablet Take 1 tablet (5 mg total) by mouth daily. 30 tablet 6  . Carbidopa-Levodopa ER (SINEMET CR) 25-100 MG tablet controlled release 2 in the AM, 1 in afternoon, 1 in the evening 120 tablet 4  . clonazePAM (KLONOPIN) 0.5 MG tablet TAKE 1/2 TABLET BY MOUTH EVERY DAY AT BEDTIME (Patient taking differently: Take 0.25 mg by mouth at bedtime. ) 45 tablet 0  . fluticasone ([name]) 110 MCG/ACT inhaler Inhale 2 puffs into the lungs 2 (two) times daily.    [name] gabapentin ([name]) 300 MG capsule Take 2 capsules (600 mg total) by mouth 3 (three) times daily. 540 capsule 3  . omeprazole (PRILOSEC) 20 MG capsule Take 1 capsule (20 mg total) by mouth daily. 30 capsule 11  . Vitamin D, Ergocalciferol, ([name]) 50000 units CAPS capsule TAKE 1 CAPSULE BY MOUTH EVERY WEEK (Patient taking differently: Take 50,000 Units by mouth every 7 (seven) days. ) 12 capsule 3   No current facility-administered medications for this visit.     Allergies:   Sulfonamide derivatives; Topamax [topiramate]; Biaxin [clarithromycin]; Hydrocodone; Motrin [ibuprofen]; Symbicort [budesonide-formoterol fumarate]; Percocet  [oxycodone-acetaminophen]; Tape; and Tetracyclines & related    Social History:  The patient  reports that she has never smoked. She has never used smokeless tobacco. She reports that she does not drink alcohol or use drugs.   Family History:  The patient's family history includes Breast cancer in her cousin; Breast cancer (age of onset: 47) in her sister; Cancer in her maternal grandfather, maternal grandmother, maternal uncle, maternal uncle, and maternal uncle; Colon cancer in her sister; Colon cancer (age of onset: 64) in her cousin; Emphysema in her mother; Liver cancer in her maternal aunt; Lung cancer (age of onset: 9) in her cousin; Lung cancer (age of onset: 50) in her father; Melanoma in her maternal aunt; Myasthenia gravis in her paternal aunt; Osteoporosis in her paternal aunt; Stroke in her paternal aunt.    ROS:  Please see the history of present illness.   Otherwise, review of systems are positive for none.   All other systems are reviewed and negative.  PHYSICAL EXAM: VS:  BP 111/71 (BP Location: Right Arm, Patient Position: Sitting, Cuff Size: Normal)   Pulse (!) 59   Ht 5' 2.5" (1.588 m)   Wt 137 lb 8 oz (62.4 kg)   BMI 24.75 kg/m  , BMI Body mass index is 24.75 kg/m. GEN: Well nourished, well developed, in no acute distress  HEENT: normal  Neck: no JVD, carotid bruits, or masses Cardiac: RRR; no murmurs, rubs, or gallops,no edema  Respiratory:  clear to auscultation bilaterally, normal work of breathing GI: soft, nontender, nondistended, + BS MS: no deformity or atrophy  Skin: warm and dry, no rash Neuro:  Strength and sensation are intact Psych: euthymic mood, full affect   EKG:  EKG is ordered today. The ekg ordered today demonstrates sinus bradycardia with minimal LVH.   Recent Labs: 10/25/2018: ALT 10 10/26/2018: TSH 1.893 10/28/2018: BUN 17; Creatinine, Ser 0.96; Hemoglobin 12.0; Platelets 195; Potassium 3.9; Sodium 138    Lipid Panel    Component  Value Date/Time   CHOL 172 01/14/2018 1121   TRIG 161.0 (H) 01/14/2018 1121   HDL 44.90 01/14/2018 1121   CHOLHDL 4 01/14/2018 1121   VLDL 32.2 01/14/2018 1121   LDLCALC 95 01/14/2018 1121   LDLDIRECT 135.0 08/16/2009 1359      Wt Readings from Last 3 Encounters:  12/23/18 137 lb 8 oz (62.4 kg)  12/05/18 137 lb (62.1 kg)  12/02/18 138 lb 12.8 oz (63 kg)      No flowsheet data found.    ASSESSMENT AND PLAN:  1.  Neurogenic orthostatic hypotension in the setting of Parkinson's disease: She continues to be symptomatic.  I elected to decrease the dose of bisoprolol to 2.5 mg once daily to allow higher blood pressure at baseline.  She has not started using an abdominal binder. I think her best option is Northera and will plan on starting that after finishing her cardiac evaluation.  2.  Mitral regurgitation due to mitral valve prolapse: Echocardiogram showed moderate and possibly moderate to severe mitral regurgitation.  The mitral regurgitation jet was very eccentric and thus it could have been underestimated by transthoracic echocardiogram.  Left atrium was moderately severely dilated which raises the possibility of possible severe mitral regurgitation. Due to this, and given her underlying exertional dyspnea, I recommend evaluation with a transesophageal echocardiogram. If TEE shows severe mitral regurgitation, then the next step is to proceed with a right and left cardiac catheterization.  If her mitral regurgitation is not severe, I plan on further ischemic cardiac evaluation with a Lexiscan Myoview.  3.  Essential hypertension: Blood pressure is controlled.  4.  Small ascending aortic aneurysm at 4 cm: I doubt that this would progress to requiring surgical intervention during her lifetime.  5.  Small pulmonary embolus: This was diagnosed in December.  Subsequent CT showed resolution of this.  Given frequent falls, I favor anticoagulation only for 3 months especially that embolus  was small.  We will give her 1 month refill and asked her to stop after that.    Disposition:   FU with me in 2 months  Signed,  [name], MD  12/23/2018 2:41 PM    [place]

## 2018-12-23 NOTE — H&P (View-Only) (Signed)
Cardiology Office Note   Date:  12/23/2018   ID:  Crystal Bass, Crystal Bass November 01, 1941, MRN [id number]  PCP:  [name], MD  Cardiologist:   [name], MD   Chief Complaint  Patient presents with  . Other    3 month follow up. Patient was in the ED 10/2018 for chest pain. Patient c/o blood pressure dropping very low and has fallen due to this. Patient denies any chest pain at this time. Meds reviewed verbally with patient.       History of Present Illness: Crystal Bass is a 78 y.o. female who is here today for follow-up visit regarding orthostatic hypotension and dyspnea.  The patient has known history of Parkinson's disease.  She was diagnosed with Parkinson's in 2015 but reports prolonged history of orthostatic dizziness. She used to be on multiple blood pressure medications.  Losartan was discontinued in September and metoprolol 25 mg twice daily was continued.  She continued to be orthostatic. She reports dyspnea with emergency room visit in September.  She has known history of COPD.  CT scan showed stable right lower lobe scarring and chronic bronchiectasis of the right lung.  She was treated with a course of prednisone.  The patient had prior cardiac catheterization in 2012 by Dr. [name] which showed normal coronary arteries and ejection fraction and normal renal arteries. There has been concern about decreasing her antihypertensive medications given the presence of an ascending aortic aneurysm at 4 cm.  Fortunately, this has been stable on serial imaging.   She was seen by Dr. Chase Caller for evaluation of cough and exertional dyspnea.  There is concern about possible underlying coronary artery disease given the presence of coronary artery calcification on previous CT imaging. Echocardiogram in October 2019 showed normal LV systolic function, grade 1 diastolic dysfunction, anterior mitral valve leaflet prolapse with moderate to severe regurgitation.  Left atrium was  moderately to severely dilated.    She was hospitalized in early December with atypical chest pain.  Initial CTA of the chest showed single small pulmonary embolism in the apical segmental branch of the left upper lobe.  There was also evidence of bronchiectasis.  Repeat CT after 2 days showed resolution of the pulmonary embolus.  The patient was started on anticoagulation with Eliquis.  She continues to have orthostatic dizziness with falling as a result.  She also continues to have significant shortness of breath.  No chest pain.  Past Medical History:  Diagnosis Date  . 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 Aortic root aneurysm ([place])   . Arthritis   . Asthma   . Breast cancer ([place]) 2002   infiltrative ductal carcinoma   . Cataract 2019  . COPD (chronic obstructive pulmonary disease) ([place]) 9/08  . Ductal carcinoma of breast, estrogen receptor positive, stage 1 ([place]) 09/16/2012  . Endometriosis   . GERD (gastroesophageal reflux disease)   . Hypercalcemia 09/14/2013  . Hypertension   . Lesion of breast 1992   right, benign  . Lung disease    secondary to [name] infection  . Mastalgia 7/95  . Osteoporosis, post-menopausal 03/14/2012  . Ovarian cancer ([place]) 2004  . Pancreatitis    secondary to Cholelithiasis  . Parkinson disease ([place]) 02/10/15  . Post-thoracotomy pain syndrome   . Pseudomonas pneumonia ([place]) 5/09  . Uterine fibroid   . Vitamin D deficiency     Past Surgical History:  Procedure Laterality Date  .  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 APPENDECTOMY  2004  . BREAST BIOPSY  08/2004   right breast-benign  . BREAST LUMPECTOMY  1992   benign  . CARDIAC CATHETERIZATION  2007   essentially negation for significant CAD  . CHOLECYSTECTOMY  2001  . COLONOSCOPY  2014   Dr [name] , due 2019  . ESOPHAGOGASTRODUODENOSCOPY  09/2018   GERD with esophagitis and stricture dilated, antral gastritis  negative for H pylori [name])  . LUNG BIOPSY  2002   [name], Dr [name]  . MASTECTOMY MODIFIED RADICAL Left 2002   oral chemotheraphy (tamoxifen then Armidex) no radiation, Dr.[name]  . TONSILLECTOMY  1946     Current Outpatient Medications  Medication Sig Dispense Refill  . acetaminophen (TYLENOL) 500 MG tablet Take 2 tablets (1,000 mg total) by mouth 2 (two) times daily as needed. (Patient taking differently: Take 1,000 mg by mouth 2 (two) times daily as needed for mild pain or moderate pain. )    . Albuterol Sulfate ([name]) 431 (90 Base) MCG/ACT AEPB Inhale 2 puffs into the lungs every 6 (six) hours as needed (dyspnea, wheezing).    [name] apixaban (ELIQUIS) 5 MG TABS tablet Take 1 tablet (5 mg total) by mouth 2 (two) times daily. 60 tablet 1  . bisoprolol (ZEBETA) 5 MG tablet Take 1 tablet (5 mg total) by mouth daily. 30 tablet 6  . Carbidopa-Levodopa ER (SINEMET CR) 25-100 MG tablet controlled release 2 in the AM, 1 in afternoon, 1 in the evening 120 tablet 4  . clonazePAM (KLONOPIN) 0.5 MG tablet TAKE 1/2 TABLET BY MOUTH EVERY DAY AT BEDTIME (Patient taking differently: Take 0.25 mg by mouth at bedtime. ) 45 tablet 0  . fluticasone ([name]) 110 MCG/ACT inhaler Inhale 2 puffs into the lungs 2 (two) times daily.    [name] gabapentin ([name]) 300 MG capsule Take 2 capsules (600 mg total) by mouth 3 (three) times daily. 540 capsule 3  . omeprazole (PRILOSEC) 20 MG capsule Take 1 capsule (20 mg total) by mouth daily. 30 capsule 11  . Vitamin D, Ergocalciferol, ([name]) 50000 units CAPS capsule TAKE 1 CAPSULE BY MOUTH EVERY WEEK (Patient taking differently: Take 50,000 Units by mouth every 7 (seven) days. ) 12 capsule 3   No current facility-administered medications for this visit.     Allergies:   Sulfonamide derivatives; Topamax [topiramate]; Biaxin [clarithromycin]; Hydrocodone; Motrin [ibuprofen]; Symbicort [budesonide-formoterol fumarate]; Percocet  [oxycodone-acetaminophen]; Tape; and Tetracyclines & related    Social History:  The patient  reports that she has never smoked. She has never used smokeless tobacco. She reports that she does not drink alcohol or use drugs.   Family History:  The patient's family history includes Breast cancer in her cousin; Breast cancer (age of onset: 46) in her sister; Cancer in her maternal grandfather, maternal grandmother, maternal uncle, maternal uncle, and maternal uncle; Colon cancer in her sister; Colon cancer (age of onset: 60) in her cousin; Emphysema in her mother; Liver cancer in her maternal aunt; Lung cancer (age of onset: 11) in her cousin; Lung cancer (age of onset: 65) in her father; Melanoma in her maternal aunt; Myasthenia gravis in her paternal aunt; Osteoporosis in her paternal aunt; Stroke in her paternal aunt.    ROS:  Please see the history of present illness.   Otherwise, review of systems are positive for none.   All other systems are reviewed and negative.  PHYSICAL EXAM: VS:  BP 111/71 (BP Location: Right Arm, Patient Position: Sitting, Cuff Size: Normal)   Pulse (!) 59   Ht 5' 2.5" (1.588 m)   Wt 137 lb 8 oz (62.4 kg)   BMI 24.75 kg/m  , BMI Body mass index is 24.75 kg/m. GEN: Well nourished, well developed, in no acute distress  HEENT: normal  Neck: no JVD, carotid bruits, or masses Cardiac: RRR; no murmurs, rubs, or gallops,no edema  Respiratory:  clear to auscultation bilaterally, normal work of breathing GI: soft, nontender, nondistended, + BS MS: no deformity or atrophy  Skin: warm and dry, no rash Neuro:  Strength and sensation are intact Psych: euthymic mood, full affect   EKG:  EKG is ordered today. The ekg ordered today demonstrates sinus bradycardia with minimal LVH.   Recent Labs: 10/25/2018: ALT 10 10/26/2018: TSH 1.893 10/28/2018: BUN 17; Creatinine, Ser 0.96; Hemoglobin 12.0; Platelets 195; Potassium 3.9; Sodium 138    Lipid Panel    Component  Value Date/Time   CHOL 172 01/14/2018 1121   TRIG 161.0 (H) 01/14/2018 1121   HDL 44.90 01/14/2018 1121   CHOLHDL 4 01/14/2018 1121   VLDL 32.2 01/14/2018 1121   LDLCALC 95 01/14/2018 1121   LDLDIRECT 135.0 08/16/2009 1359      Wt Readings from Last 3 Encounters:  12/23/18 137 lb 8 oz (62.4 kg)  12/05/18 137 lb (62.1 kg)  12/02/18 138 lb 12.8 oz (63 kg)      No flowsheet data found.    ASSESSMENT AND PLAN:  1.  Neurogenic orthostatic hypotension in the setting of Parkinson's disease: She continues to be symptomatic.  I elected to decrease the dose of bisoprolol to 2.5 mg once daily to allow higher blood pressure at baseline.  She has not started using an abdominal binder. I think her best option is Northera and will plan on starting that after finishing her cardiac evaluation.  2.  Mitral regurgitation due to mitral valve prolapse: Echocardiogram showed moderate and possibly moderate to severe mitral regurgitation.  The mitral regurgitation jet was very eccentric and thus it could have been underestimated by transthoracic echocardiogram.  Left atrium was moderately severely dilated which raises the possibility of possible severe mitral regurgitation. Due to this, and given her underlying exertional dyspnea, I recommend evaluation with a transesophageal echocardiogram. If TEE shows severe mitral regurgitation, then the next step is to proceed with a right and left cardiac catheterization.  If her mitral regurgitation is not severe, I plan on further ischemic cardiac evaluation with a Lexiscan Myoview.  3.  Essential hypertension: Blood pressure is controlled.  4.  Small ascending aortic aneurysm at 4 cm: I doubt that this would progress to requiring surgical intervention during her lifetime.  5.  Small pulmonary embolus: This was diagnosed in December.  Subsequent CT showed resolution of this.  Given frequent falls, I favor anticoagulation only for 3 months especially that embolus  was small.  We will give her 1 month refill and asked her to stop after that.    Disposition:   FU with me in 2 months  Signed,  [name], MD  12/23/2018 2:41 PM    [place]

## 2018-12-24 ENCOUNTER — Telehealth: Payer: Self-pay | Admitting: Cardiovascular Disease

## 2018-12-24 LAB — BASIC METABOLIC PANEL
BUN/Creatinine Ratio: 16 (ref 12–28)
BUN: 16 mg/dL (ref 8–27)
CO2: 22 mmol/L (ref 20–29)
Calcium: 9.7 mg/dL (ref 8.7–10.3)
Chloride: 102 mmol/L (ref 96–106)
Creatinine, Ser: 0.97 mg/dL (ref 0.57–1.00)
GFR calc Af Amer: 66 mL/min/{1.73_m2} (ref >59)
GFR, EST NON AFRICAN AMERICAN: 57 mL/min/{1.73_m2} — AB (ref >59)
Glucose: 93 mg/dL (ref 65–99)
Potassium: 4.3 mmol/L (ref 3.5–5.2)
Sodium: 141 mmol/L (ref 134–144)

## 2018-12-24 LAB — CBC
Hematocrit: 40.5 % (ref 34.0–46.6)
Hemoglobin: 13.7 g/dL (ref 11.1–15.9)
MCH: 30.5 pg (ref 26.6–33.0)
MCHC: 33.8 g/dL (ref 31.5–35.7)
MCV: 90 fL (ref 79–97)
Platelets: 167 10*3/uL (ref 150–450)
RBC: 4.49 x10E6/uL (ref 3.77–5.28)
RDW: 12.5 % (ref 11.7–15.4)
WBC: 5.9 10*3/uL (ref 3.4–10.8)

## 2018-12-24 NOTE — Telephone Encounter (Signed)
Spoke with the patient. She wanted to let the office know that she has a ride home from the TEE and that the date and time was going to work for her (2/3 at 7:30)  Nothing further needed at this time.

## 2018-12-24 NOTE — Telephone Encounter (Signed)
Patient calling Re: TEE .  She has transportation and someone at home after but not during the procedure.  Patient doesn't know why she would need someone during the test but she states no one is available .  Please call to confirm this is ok and she can proceed to have test

## 2018-12-29 ENCOUNTER — Other Ambulatory Visit: Payer: Self-pay

## 2018-12-29 ENCOUNTER — Other Ambulatory Visit: Payer: Self-pay | Admitting: Neurology

## 2018-12-29 ENCOUNTER — Encounter
Admission: RE | Disposition: A | Discharge status: Home / Self Care | Payer: Self-pay | Source: Home / Self Care | Attending: Cardiovascular Disease

## 2018-12-29 ENCOUNTER — Encounter: Payer: Self-pay | Admitting: *Deleted

## 2018-12-29 ENCOUNTER — Ambulatory Visit (HOSPITAL_BASED_OUTPATIENT_CLINIC_OR_DEPARTMENT_OTHER)
Admission: RE | Admit: 2018-12-29 | Discharge: 2018-12-29 | Disposition: A | Discharge status: Home / Self Care | Payer: Medicare Other | Source: Home / Self Care | Attending: Cardiovascular Disease | Admitting: Cardiovascular Disease

## 2018-12-29 ENCOUNTER — Ambulatory Visit
Admission: RE | Admit: 2018-12-29 | Discharge: 2018-12-29 | Disposition: A | Discharge status: Home / Self Care | Payer: Medicare Other | Attending: Cardiovascular Disease | Admitting: Cardiovascular Disease

## 2018-12-29 ENCOUNTER — Telehealth: Payer: Self-pay | Admitting: Neurology

## 2018-12-29 DIAGNOSIS — Z885 Allergy status to narcotic agent status: Secondary | ICD-10-CM | POA: Diagnosis not present

## 2018-12-29 DIAGNOSIS — I340 Nonrheumatic mitral (valve) insufficiency: Secondary | ICD-10-CM

## 2018-12-29 DIAGNOSIS — I341 Nonrheumatic mitral (valve) prolapse: Secondary | ICD-10-CM | POA: Insufficient documentation

## 2018-12-29 DIAGNOSIS — Z888 Allergy status to other drugs, medicaments and biological substances status: Secondary | ICD-10-CM | POA: Insufficient documentation

## 2018-12-29 DIAGNOSIS — J449 Chronic obstructive pulmonary disease, unspecified: Secondary | ICD-10-CM | POA: Diagnosis not present

## 2018-12-29 DIAGNOSIS — Z881 Allergy status to other antibiotic agents status: Secondary | ICD-10-CM | POA: Diagnosis not present

## 2018-12-29 DIAGNOSIS — Z7901 Long term (current) use of anticoagulants: Secondary | ICD-10-CM | POA: Insufficient documentation

## 2018-12-29 DIAGNOSIS — Z853 Personal history of malignant neoplasm of breast: Secondary | ICD-10-CM | POA: Insufficient documentation

## 2018-12-29 DIAGNOSIS — I10 Essential (primary) hypertension: Secondary | ICD-10-CM | POA: Diagnosis not present

## 2018-12-29 DIAGNOSIS — Z86711 Personal history of pulmonary embolism: Secondary | ICD-10-CM | POA: Insufficient documentation

## 2018-12-29 DIAGNOSIS — Z882 Allergy status to sulfonamides status: Secondary | ICD-10-CM | POA: Diagnosis not present

## 2018-12-29 DIAGNOSIS — K219 Gastro-esophageal reflux disease without esophagitis: Secondary | ICD-10-CM | POA: Insufficient documentation

## 2018-12-29 DIAGNOSIS — Z8543 Personal history of malignant neoplasm of ovary: Secondary | ICD-10-CM | POA: Insufficient documentation

## 2018-12-29 DIAGNOSIS — I371 Nonrheumatic pulmonary valve insufficiency: Secondary | ICD-10-CM | POA: Insufficient documentation

## 2018-12-29 DIAGNOSIS — Z79899 Other long term (current) drug therapy: Secondary | ICD-10-CM | POA: Insufficient documentation

## 2018-12-29 DIAGNOSIS — G20 Parkinson's disease: Secondary | ICD-10-CM | POA: Diagnosis not present

## 2018-12-29 DIAGNOSIS — Z886 Allergy status to analgesic agent status: Secondary | ICD-10-CM | POA: Insufficient documentation

## 2018-12-29 HISTORY — PX: TEE WITHOUT CARDIOVERSION: SHX5443

## 2018-12-29 SURGERY — ECHOCARDIOGRAM, TRANSESOPHAGEAL
Anesthesia: Moderate Sedation

## 2018-12-29 MED ORDER — CLONAZEPAM 0.5 MG PO TABS: 0.2500 mg | ORAL_TABLET | Freq: Every day | ORAL | 1 refills | Status: DC

## 2018-12-29 MED ORDER — SODIUM CHLORIDE FLUSH 0.9 % IV SOLN
INTRAVENOUS | Status: AC
  Filled 2018-12-29: qty 10

## 2018-12-29 MED ORDER — FENTANYL CITRATE (PF) 100 MCG/2ML IJ SOLN
INTRAMUSCULAR | Status: AC | PRN
  Administered 2018-12-29: 25 ug via INTRAVENOUS

## 2018-12-29 MED ORDER — MIDAZOLAM HCL 5 MG/5ML IJ SOLN
INTRAMUSCULAR | Status: AC
  Filled 2018-12-29: qty 5

## 2018-12-29 MED ORDER — MIDAZOLAM HCL 5 MG/5ML IJ SOLN
INTRAMUSCULAR | Status: AC | PRN
  Administered 2018-12-29: 2 mg via INTRAVENOUS

## 2018-12-29 MED ORDER — BUTAMBEN-TETRACAINE-BENZOCAINE 2-2-14 % EX AERO
INHALATION_SPRAY | CUTANEOUS | Status: AC
  Administered 2018-12-29: 3
  Filled 2018-12-29: qty 5

## 2018-12-29 MED ORDER — LIDOCAINE VISCOUS HCL 2 % MT SOLN
OROMUCOSAL | Status: AC
  Administered 2018-12-29: 15 mL
  Filled 2018-12-29: qty 15

## 2018-12-29 MED ORDER — SODIUM CHLORIDE 0.9 % IV SOLN
INTRAVENOUS | Status: DC
  Administered 2018-12-29: 07:00:00 via INTRAVENOUS

## 2018-12-29 MED ORDER — FENTANYL CITRATE (PF) 100 MCG/2ML IJ SOLN
INTRAMUSCULAR | Status: AC
  Filled 2018-12-29: qty 2

## 2018-12-29 NOTE — Telephone Encounter (Signed)
Will you send please?

## 2018-12-29 NOTE — CV Procedure (Signed)
TEE was performed without complications.  See TEE report.  During this procedure the patient is administered a total of Versed 2 mg and Fentanyl 25 mcg to achieve and maintain moderate conscious sedation.  The patient's heart rate, blood pressure, and oxygen saturation are monitored continuously during the procedure. The period of conscious sedation is 15 minutes, of which I was present face-to-face 100% of this time.

## 2018-12-29 NOTE — Telephone Encounter (Signed)
sent 

## 2018-12-29 NOTE — Interval H&P Note (Signed)
History and Physical Interval Note:  12/29/2018 7:42 AM  Crystal Bass  has presented today for surgery, with the diagnosis of TEE   Mitral valve insufficiency  The various methods of treatment have been discussed with the patient and family. After consideration of risks, benefits and other options for treatment, the patient has consented to  Procedure(s): TRANSESOPHAGEAL ECHOCARDIOGRAM (TEE) (N/A) as a surgical intervention .  The patient's history has been reviewed, patient examined, no change in status, stable for surgery.  I have reviewed the patient's chart and labs.  Questions were answered to the patient's satisfaction.     [name]

## 2018-12-29 NOTE — Progress Notes (Signed)
*  PRELIMINARY RESULTS* Echocardiogram Echocardiogram Transesophageal has been performed.  Sherrie Sport 12/29/2018, 8:08 AM

## 2018-12-29 NOTE — Telephone Encounter (Signed)
Patient calling in about the refill for the clonazepam medication. She needs this sent to [facility] in [place] as she is out of meds. Thanks!

## 2018-12-30 ENCOUNTER — Encounter: Payer: Self-pay | Admitting: Cardiovascular Disease

## 2018-12-31 ENCOUNTER — Ambulatory Visit: Payer: Medicare Other | Admitting: Family Medicine

## 2018-12-31 ENCOUNTER — Encounter: Payer: Self-pay | Admitting: Family Medicine

## 2018-12-31 ENCOUNTER — Telehealth: Payer: Self-pay | Admitting: Neurology

## 2018-12-31 VITALS — BP 122/80 | HR 62 | Temp 97.6°F | Ht 62.5 in | Wt 137.0 lb

## 2018-12-31 DIAGNOSIS — A310 Pulmonary mycobacterial infection: Secondary | ICD-10-CM

## 2018-12-31 DIAGNOSIS — R0600 Dyspnea, unspecified: Secondary | ICD-10-CM

## 2018-12-31 DIAGNOSIS — G20 Parkinson's disease: Secondary | ICD-10-CM | POA: Diagnosis not present

## 2018-12-31 DIAGNOSIS — I2699 Other pulmonary embolism without acute cor pulmonale: Secondary | ICD-10-CM

## 2018-12-31 DIAGNOSIS — I10 Essential (primary) hypertension: Secondary | ICD-10-CM

## 2018-12-31 DIAGNOSIS — I951 Orthostatic hypotension: Secondary | ICD-10-CM

## 2018-12-31 DIAGNOSIS — R0609 Other forms of dyspnea: Secondary | ICD-10-CM

## 2018-12-31 DIAGNOSIS — N281 Cyst of kidney, acquired: Secondary | ICD-10-CM

## 2018-12-31 NOTE — Telephone Encounter (Signed)
Called patient to see how she was doing on Carbidopa Levodopa CR (switching from IR version) she states EDS no better and she doesn't think it is as effective in treating her symptoms. She is currently taking 2 tablets at 8 am, 1 tablet at 2pm, and 1 tablet at 6pm.  Please advise.

## 2018-12-31 NOTE — Assessment & Plan Note (Signed)
Chronic, stable on low dose bisoprolol.

## 2018-12-31 NOTE — Assessment & Plan Note (Signed)
Checked today. She declines compression stockings/abd binder. Considering northera.

## 2018-12-31 NOTE — Progress Notes (Addendum)
BP 122/80 (BP Location: Left Arm, Patient Position: Sitting, Cuff Size: Normal)   Pulse 62   Temp 97.6 F (36.4 C) (Oral)   Ht 5' 2.5" (1.588 m)   Wt 137 lb (62.1 kg)   SpO2 95%   BMI 24.66 kg/m   Orthostatic VS for the past 24 hrs (Last 3 readings):  BP- Lying BP- Standing at 0 minutes  12/31/18 1304 - 108/70  12/31/18 1302 150/90 -    CC: 2 mo f/u visit Subjective:    Patient ID: Crystal Bass, female    DOB: 10/20/1941, 78 y.o.   MRN: [id number]  HPI: Crystal Bass is a 78 y.o. female presenting on 12/31/2018 for Follow-up (Here for 2 mo f/u.)   See prior note for details - recent hospitalization late last year for acute small PE and ?progression of atypical pulm mycobacterial infection s/p levaquin course. rec complete 3 months eliquis (started 10/26/2018).   She has felt well over the last few months. Notes persistent dyspnea and orthostatic dizziness. She has not tried abd binder or compression stockings.   Pulm recommended cardiac eval with Dr Fletcher Anon and planned pulm f/u 1 month, considering ID eval for [name]. Cardiology recommended stopping eliquis after 3 months given fall risk. Also planning to start Nothera and obtaining TEE.   Consider f/u complex kidney cyst. Sister with recent surgery for kidney cancer.  Ho borderline elevated CA125, latest reading normal range.     Relevant past medical, surgical, family and social history reviewed and updated as indicated. Interim medical history since our last visit reviewed. Allergies and medications reviewed and updated. Outpatient Medications Prior to Visit  Medication Sig Dispense Refill  . acetaminophen (TYLENOL) 500 MG tablet Take 2 tablets (1,000 mg total) by mouth 2 (two) times daily as needed. (Patient taking differently: Take 1,000 mg by mouth 2 (two) times daily as needed for mild pain or moderate pain. )    . Albuterol Sulfate ([name]) 009 (90 Base) MCG/ACT AEPB Inhale 2 puffs into the lungs every 6  (six) hours as needed (dyspnea, wheezing).    [name] apixaban (ELIQUIS) 5 MG TABS tablet Take 1 tablet (5 mg total) by mouth 2 (two) times daily. 60 tablet 0  . bisoprolol (ZEBETA) 5 MG tablet Take 0.5 tablets (2.5 mg total) by mouth daily. 15 tablet 2  . Carbidopa-Levodopa ER (SINEMET CR) 25-100 MG tablet controlled release 2 in the AM, 1 in afternoon, 1 in the evening (Patient taking differently: Take 1-2 tablets by mouth See admin instructions. 2 in the AM, 1 in afternoon, 1 in the evening) 120 tablet 4  . clonazePAM (KLONOPIN) 0.5 MG tablet Take 0.5 tablets (0.25 mg total) by mouth at bedtime. 45 tablet 1  . Fluticasone-Umeclidin-Vilant (TRELEGY ELLIPTA) 100-62.5-25 MCG/INH AEPB Inhale 1 puff into the lungs daily.    [name] gabapentin ([name]) 300 MG capsule Take 2 capsules (600 mg total) by mouth 3 (three) times daily. 540 capsule 3  . omeprazole (PRILOSEC) 20 MG capsule Take 1 capsule (20 mg total) by mouth daily. 30 capsule 11  . Vitamin D, Ergocalciferol, ([name]) 50000 units CAPS capsule TAKE 1 CAPSULE BY MOUTH EVERY WEEK (Patient taking differently: Take 50,000 Units by mouth every 7 (seven) days. ) 12 capsule 3   No facility-administered medications prior to visit.      Per HPI unless specifically indicated in ROS section below Review of Systems Objective:    BP 122/80 (BP Location: Left Arm, Patient Position: Sitting, Cuff  Size: Normal)   Pulse 62   Temp 97.6 F (36.4 C) (Oral)   Ht 5' 2.5" (1.588 m)   Wt 137 lb (62.1 kg)   SpO2 95%   BMI 24.66 kg/m   Wt Readings from Last 3 Encounters:  12/31/18 137 lb (62.1 kg)  12/29/18 136 lb (61.7 kg)  12/23/18 137 lb 8 oz (62.4 kg)    Physical Exam Vitals signs and nursing note reviewed.  Constitutional:      Appearance: Normal appearance. She is not ill-appearing.  HENT:     Mouth/Throat:     Mouth: Mucous membranes are moist.     Pharynx: Oropharynx is clear.  Cardiovascular:     Rate and Rhythm: Normal rate and regular rhythm.       Pulses: Normal pulses.     Heart sounds: Normal heart sounds. No murmur.  Pulmonary:     Effort: Pulmonary effort is normal. No respiratory distress.     Breath sounds: No wheezing, rhonchi or rales.     Comments: Chronic bibasilar crackles Musculoskeletal:     Right lower leg: No edema.     Left lower leg: No edema.  Neurological:     Mental Status: She is alert.  Psychiatric:        Mood and Affect: Mood normal.       Results for orders placed or performed in visit on 32/67/12  Basic metabolic panel  Result Value Ref Range   Glucose 93 65 - 99 mg/dL   BUN 16 8 - 27 mg/dL   Creatinine, Ser 0.97 0.57 - 1.00 mg/dL   GFR calc non Af Amer 57 (L) >59 mL/min/1.73   GFR calc Af Amer 66 >59 mL/min/1.73   BUN/Creatinine Ratio 16 12 - 28   Sodium 141 134 - 144 mmol/L   Potassium 4.3 3.5 - 5.2 mmol/L   Chloride 102 96 - 106 mmol/L   CO2 22 20 - 29 mmol/L   Calcium 9.7 8.7 - 10.3 mg/dL  CBC  Result Value Ref Range   WBC 5.9 3.4 - 10.8 x10E3/uL   RBC 4.49 3.77 - 5.28 x10E6/uL   Hemoglobin 13.7 11.1 - 15.9 g/dL   Hematocrit 40.5 34.0 - 46.6 %   MCV 90 79 - 97 fL   MCH 30.5 26.6 - 33.0 pg   MCHC 33.8 31.5 - 35.7 g/dL   RDW 12.5 11.7 - 15.4 %   Platelets 167 150 - 450 x10E3/uL   Assessment & Plan:   Problem List Items Addressed This Visit    Renal cyst, acquired, right    Discussed with patient. Update renal US for later this month (ordered today)      Relevant Orders   US Renal   Pulmonary embolism and infarction ([place])    To complete 3 months of eliquis later this month, then transition back to aspirin 81mg  daily.      Parkinson disease [facility])    Appreciate neuro care. Upcoming appt next month. Considering northera with cards      Orthostatic hypotension - Primary    Checked today. She declines compression stockings/abd binder. Considering northera.       [name] (mycobacterium avium-intracellulare) infection ([place])    Appreciate pulm care.       Essential  hypertension    Chronic, stable on low dose bisoprolol.       Chronic dyspnea    Undergoing pulm and cards evaluation. Stable period.  No orders of the defined types were placed in this encounter.  Orders Placed This Encounter  Procedures  . US Renal    Standing Status:   Future    Standing Expiration Date:   02/29/2020    Order Specific Question:   Reason for Exam (SYMPTOM  OR DIAGNOSIS REQUIRED)    Answer:   f/u complex renal cyst    Order Specific Question:   Preferred imaging location?    Answer:   [facility]    Follow up plan: No follow-ups on file.  [name], MD

## 2018-12-31 NOTE — Telephone Encounter (Signed)
Then sleepiness likely not due to carbidopa/levodopa 25/100.  She has other reasons to be sleepy, including the gabapentin but hasn't wanted to change that.  Can go back to IR carbidopa/levodopa if she felt worked better.

## 2018-12-31 NOTE — Assessment & Plan Note (Signed)
Appreciate pulm care.  ?

## 2018-12-31 NOTE — Assessment & Plan Note (Signed)
Undergoing pulm and cards evaluation. Stable period.

## 2018-12-31 NOTE — Assessment & Plan Note (Signed)
To complete 3 months of eliquis later this month, then transition back to aspirin 81mg  daily.

## 2018-12-31 NOTE — Telephone Encounter (Signed)
Patient made aware. She has enough of IR tablets and will contact us when she needs a new RX sent to pharmacy.

## 2018-12-31 NOTE — Assessment & Plan Note (Signed)
Appreciate neuro care. Upcoming appt next month. Considering northera with cards

## 2018-12-31 NOTE — Assessment & Plan Note (Addendum)
Discussed with patient. Update renal US for later this month (ordered today)

## 2018-12-31 NOTE — Patient Instructions (Addendum)
We will schedule kidney ultrasound for later this month to follow previously seen kidney cyst on right.  When you finish eliquis, restart 81mg  aspirin daily.  Orthostatic vital signs today. Keep next month's physical appointment.  Good to see you today.

## 2019-01-05 NOTE — Addendum Note (Signed)
Addended by: Ricci Barker on: 01/05/2019 03:08 PM   Modules accepted: Orders

## 2019-01-13 ENCOUNTER — Encounter: Payer: Self-pay | Admitting: Internal Medicine

## 2019-01-13 ENCOUNTER — Ambulatory Visit (INDEPENDENT_AMBULATORY_CARE_PROVIDER_SITE_OTHER): Payer: Medicare Other | Admitting: Internal Medicine

## 2019-01-13 ENCOUNTER — Ambulatory Visit: Payer: Medicare Other | Admitting: Internal Medicine

## 2019-01-13 VITALS — BP 112/68 | HR 93 | Ht 62.0 in | Wt 138.0 lb

## 2019-01-13 DIAGNOSIS — R5382 Chronic fatigue, unspecified: Secondary | ICD-10-CM | POA: Diagnosis not present

## 2019-01-13 DIAGNOSIS — J449 Chronic obstructive pulmonary disease, unspecified: Secondary | ICD-10-CM

## 2019-01-13 DIAGNOSIS — R0600 Dyspnea, unspecified: Secondary | ICD-10-CM

## 2019-01-13 DIAGNOSIS — Z86711 Personal history of pulmonary embolism: Secondary | ICD-10-CM

## 2019-01-13 DIAGNOSIS — R0609 Other forms of dyspnea: Secondary | ICD-10-CM | POA: Diagnosis not present

## 2019-01-13 DIAGNOSIS — R490 Dysphonia: Secondary | ICD-10-CM

## 2019-01-13 DIAGNOSIS — Z8619 Personal history of other infectious and parasitic diseases: Secondary | ICD-10-CM

## 2019-01-13 LAB — PULMONARY FUNCTION TEST
DL/VA % pred: 116 %
DL/VA: 4.86 ml/min/mmHg/L
DLCO COR: 17.12 ml/min/mmHg
DLCO cor % pred: 95 %
DLCO unc % pred: 96 %
DLCO unc: 17.28 ml/min/mmHg
FEF 25-75 Post: 1.51 L/sec
FEF 25-75 Pre: 1.07 L/sec
FEF2575-%Change-Post: 41 %
FEF2575-%Pred-Post: 97 %
FEF2575-%Pred-Pre: 69 %
FEV1-%Change-Post: 9 %
FEV1-%PRED-PRE: 70 %
FEV1-%Pred-Post: 77 %
FEV1-Post: 1.48 L
FEV1-Pre: 1.36 L
FEV1FVC-%Change-Post: 2 %
FEV1FVC-%Pred-Pre: 99 %
FEV6-%Change-Post: 6 %
FEV6-%Pred-Post: 79 %
FEV6-%Pred-Pre: 74 %
FEV6-Post: 1.94 L
FEV6-Pre: 1.81 L
FEV6FVC-%Pred-Post: 105 %
FEV6FVC-%Pred-Pre: 105 %
FVC-%Change-Post: 6 %
FVC-%Pred-Post: 75 %
FVC-%Pred-Pre: 70 %
FVC-PRE: 1.81 L
FVC-Post: 1.94 L
Post FEV1/FVC ratio: 77 %
Post FEV6/FVC ratio: 100 %
Pre FEV1/FVC ratio: 75 %
Pre FEV6/FVC Ratio: 100 %
RV % pred: 136 %
RV: 2.99 L
TLC % pred: 102 %
TLC: 4.89 L

## 2019-01-13 LAB — D-DIMER, QUANTITATIVE: D-Dimer, Quant: <0.19 mcg/mL FEU (ref <0.50)

## 2019-01-13 NOTE — Progress Notes (Signed)
Patient completed PFT today. 

## 2019-01-13 NOTE — Progress Notes (Signed)
78 yowf never smoker with h/o [name] referred to Pulmonary clinic by Dr Linna Darner for chronic cough. CXR 4/13 COPD/emphysema  Stable postoperative changes involving the right lung. CT chest 03/12/11 post op changes on R, scattered perbronch nodules and mild bronchiectasis  03/13/2011 Initial pulmonary office eval in EMR era main c/o chronic cough (x years) and sob and worse x 3 weeks abruptly sob  Assoc with cough with green mucus which is a lot better p rx with augmentin  but breathing not better to her satisfaction since starting topamax with variable doe and resting sob and  having trouble talking.  No overt hb. No better on advair.  > rec stop advair, start high dose ppi/ pepcid hs and diet  03/28/2011 ov / Wert some better cough  But still sob with housework and walk at [employer], does not use [facility] parking. rec Continue Prilosec 20 mg Take 30-60 min before first meal of the day and Pepcid 20 mg one at bedtime  GERD (REFLUX) diet  04/17/2011 ov/Wert  Cc doe x years walking x 30 min x moderate pace. Cough resolved "because I stopped the topamax" Sleeping ok without nocturnal  or early am exac of resp c/o's. >>d/c ppi   08/15/2012 Acute OV  Complains of prod cough with yellow mucus, increased SOB x2 weeks.  Was given augmentin by PCP yesterday. Has not started this yet .  Not seen for > 1 year , doing okay w/out flares   no otc used.  No fever or hemoptysis . No chest pain  No recent travel or abx uses  >Take Augmentin as directed  Mucinex DM Twice daily As needed Cough/congestion  Fluids and rest  follow up 4 weeks and As needed  I will call with xray results.  Please contact office for sooner follow up if symptoms do not improve or worsen or seek emergency care   OV 09/17/2012  - switched to Dr MR Dyspnea: insidious onset. Progressive/Stable since onset several years ago. Rates dyspnea as moderate. Activities such as walking < few hundred feet or < 1/4 mile makes her dyspneic.  Humidity, pollens makes dyspnea worse.  Improved by sitting and resting. No associated chest pains. She feels she is deconditioned and is in a catch 22 situation.    Cough: is independent of dyspnea. Cough present for several years. Constant (not on and off). Worsened by infectious bronchitis episodes. Never had remission beyond 4 weeks with cough. Cough is moderate in intensity at baseline and with infectious episodes can get severe. She finds this embarrassing esp because she has to visit her ailing m-i-l in a nursing home. Quality is wet; brings out pea-green sputum during bronchitis but currently jelly like yellow which is baseline. OF note, she took 2 x 6-9 months of [name] Rx in 2002 and 2003 but she is not sure if that helped cough.    - Sinus: denies problems other than anosmia. Denies chronic rhino sinusitis symptoms Never seen ENT  - Allergies - sees Dr Donneta Romberg and has weekly allergy shots against mildew, southern grasses x 40 years. Symptoms are well controled but are typically sneezing, runny nose, eyes. Says if she does not take allergy shots - allergies return with a "vengeance"   - GERD: denies. Per hx prior trial of ppi did not help  - BP: takes losartan and lopressor  - Pulmonary - never smoker. Born with asthma. Thinks she has copd now. Does not take any mdi because  she felt she could manage without it. However, Dr Donneta Romberg has recommended mdi and she believes she is close to making a decision on this.  PFTs may 2012: Mild obstruction with non-significant BD response. Normal DLCO. Carries prior diagnosis of bronchiectasis and mom died of it. .Denies diagnostic workup for bronchiectasis and does not want workup. CT oct 2013: Areas of bronchiectasis and scarring in the lingula and anterior right middle lobe. Pulmonary nodule: RLL Oct 2013: stable since oct 2012  10/01/2012 Acute OV  Complains of increased SOB, wheezing, prod cough with cream/gray/pea-green mucus, fever up to 100 x6 days -  reports was cleaning mildew outside the day before.  was given levaquin and pred taper by MR 11/4 via phone msg No hemoptyiss , chest pain or edema.  Mistakenly took dog's allergy med this am. Poison control and PCP/Vet said she should be fine.  No otc used .    REC CMA/RN will walk you for oxygen levels right now  Let us try pulmicort 139mcg, 2puff twice daily - take few samples and learn technique. Will send in script as well  REturn in 4-6 weeks with pft testing  Cough score at followup  OV 10/21/2012   Fu dyspnea and cough after starting pulmicort. She feels both have improved around 75%. "Yucky" yellow sputum is resolved. She is no longer embarrassed about visiting her mother in law in SNF. Current RSI cough score is 9 and reflects improvement post pulmicort; we do not have baseline value. Kouffman cough differentiator score is 0 for GERD and 1 for airway/neurogenic. PFT 10/21/2012 below: shows miild-moderate obstriuction with sig BD responmse and normal DLCO (like an asthmatic). She is hesitant to continue ICS due to risk of accelerated bone loss but after long discussion agreed. She agrees to add LABA Rx and therefore will  switch from pulmicort to symbicort       REC You have asthma or your bronchiectasis making you behave like an asthmatic PFT still shows obstruction at moderate severity despite pulmicort So stop pulmicort Please start symbicort 80/4.5 2 puff twice daily - take sample, script and show technique Use albuterol as needed Have pneumovax 10/21/2012 Glad you had flu shot Return in 3 months or sooner if needed   OV 02/12/2014  Chief Complaint  Patient presents with  . Asthma    pt states she has SOB with activity, and an occasional produtive cough with green phlegm.     Follow dyspnea and cough in the setting of obstructive lung disease related to asthma/bronchiectasis  I'm not seen in nearly a year and a half. She continues to do well. No interim problems.  Cough and dyspnea minimal. No interim hospitalizations, urgent care visits, emergency room visits or new medical diagnoses. No changes in her social situation other than the fact that her mother-in-law passed away. She wants to cut down her inhale corticosteroid use because of ongoing osteoporosis and concerns of that getting worse. She is interested in Prevnar      Subjective:    Patient ID: Crystal Bass, female    DOB: [birth year], 78 y.o.   MRN: [id number]  PCP [name], MD  HPI    IOV 09/04/2018  Chief Complaint  Patient presents with  . Consult    Returning due to SOB increasing.      Crystal Bass 78 y.o. female  -last seen in 2015.  It is been over 3 years and is actually for years.  Therefore it is a new  visit.  She tells me that she has had worsening shortness of breath in the last 1 year particularly since November 2018.  Also increase in cough but mostly shortness of breath worse with exertion and relieved by rest.   Symptm severity is listed blow - CAT score 32 and high symptomb uren The major changes to her life is that she was taking care of her mother-in-law with Alzheimer's and then she passed away.  She herself has been diagnosed with Parkinson's with only tremors.  Well-controlled with carbidopa.  She is now living in an apartment kind of situation in [place].  There is no cancer diagnosis or recurrence of prior cancers.  She continues with Flovent.  There is no chest pain orthopnea proximal nocturnal dyspnea.  She ended up in the ER late September 2019 and was diagnosed as "COPD exacerbation".  And was given 4 days prednisone and then released.  She says it only made a somewhat better.  Review of the chart indicates moderate obstructive lung function deficit with bronchodilator response and a normal DLCO but this was in 2013.  Blood labs on August 21 2018 shows creatinine 0.88 mg and hemoglobin 13.4 g%.  Noted she is on a beta 1 specific  beta blocker called Lopressor.    reports that she has never smoked. She has never used smokeless tobacco.   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[place]), Arthritis, Asthma, Breast cancer ([place]) (2002), Cataract (2019), COPD (chronic obstructive pulmonary disease) ([place]) (9/08), Ductal carcinoma of breast, estrogen receptor positive, stage 1 ([place]) (09/16/2012), Endometriosis, GERD (gastroesophageal reflux disease), Hypercalcemia (09/14/2013), Hypertension, Lesion of breast (1992), Lung disease, Mastalgia (7/95), Osteoporosis, post-menopausal (03/14/2012), Ovarian cancer ([place]) (2004), Pancreatitis, Parkinson disease ([place]) (02/10/15), Post-thoracotomy pain syndrome, Pseudomonas pneumonia ([place]) (5/09), Uterine fibroid, and Vitamin D deficiency.   10/02/2018 Patient presents today for 1 month follow-up. Breathing is about the same, cough with mild production. Sob especially with activity. Associated chest tightness and wheezing. These symptoms have been going on for some time, worse over the last year. Hasn't noticed a difference with Spiriva. She has been on Flovent for awhile, recently feels it has been causing chest tightness and sob after taking medication but eases after some time. She is not interested in trying another ICS inhaler right now. Continues with allergy shots with Dr. Donneta Romberg. Consideration for adding newer generation biologic like Xolair or Dupixent. Needs to complete PFTs.     Significant testing: 09/04/18- FENO 35 09/04/18- IgE 85 10/01/2018 - Eos absolute 0.2  09/04/18 - Alpha-1 antitrypsin- 144 (within normal range) CXR showed chronic stable bilateraler bronchiectasis. Stable scar in RUL unchanged since 2012    OV 12/02/2018  Subjective:  Patient ID: Crystal Bass, female , DOB: Dec 22, 1940 , age 85 y.o. , MRN: [id number] , ADDRESS: [address] [place] [address]   12/02/2018 -   Chief Complaint  Patient presents with  .  Follow-up    Pt states she is about the same since last visit. Pt had to have the PFT cancelled due to a PE. Pt does have complaints of SOB with exertion and occ cough.     HPI SARIE STALL 78 y.o. -patient with known bronchiectasis/allergic asthma on allergy shots and inhaled steroid.  Here for worsening shortness of breath  I last saw her November 2019.  Since then she says she is got continued worsening shortness of breath.  She is failed an  inhaler check change.  In addition she failed a change to specific beta-1 beta-blocker.  Also she got admitted on October 26, 2018 with single lobe pulmonary embolism.  Doppler legs was negative for DVT.  She is on Eliquis since then but this also has not helped her shortness of breath.  She feels that she had chest tightness at the time of PE.  She feels worsening shortness of breath in the last 1 year is unrelated.  October 2019 she had cardiac echo that shows only grade 1 diastolic dysfunction.  She has not had a stress test.  In December 2019 when she had CT chest she had some coronary artery calcifications.  But she denies having had a cardiac stress test.  Dyspnea is present on exertion relieved by rest.  Walking desaturation test 0.50 feet x 3 laps.  Resting pulse ox 96%.  Resting heart rate 58/min.  Final pulse ox 95%.  Final heart rate 67/min.  Did not desaturate.  Only had a modicum of a heart rate response.  In terms of her [name].  She says this was treated in 2002 for 6 months by Dr. Megan Salon and then sometime later was treated again for another 6 months.  Both treatments dealt by the infectious disease specialist.  Since then she feels the [name] is not recurred.  However the current CT chest in December 2019 does show increased pulmonary infiltrates consistent with recurrent/worsening [name].  Her last PFT was in 2013.  Current PFT was on hold because of the recent PE.    OV 01/13/2019  Subjective:  Patient ID: Crystal Bass, female , DOB:  [birth year] , age 89 y.o. , MRN: [id number] , ADDRESS: [id number] [place] [address]   01/13/2019 -   Chief Complaint  Patient presents with  . Follow-up    Pt completed full PFT prior to ov today.   Known asthma on inhaler with bronchiectasis with CT findings of [name] on treatment history of PE late 2019 on anticoagulation - 3 months course per patient. [place] Mitral Regurgitation followed by Dr Fletcher Anon. Coronary Artery Calcification on CT.  Parkinson on carbidopa.  HPI Crystal Bass 78 y.o. -presents for follow-up of her multifactorial dyspnea.  She had pulmonary function test today that shows restriction on spirometry but normal lung volumes and normal DLCO.  Therefore essentially noncontributory.  In the interim she has seen cardiology and had a TEE that shows good ejection fraction and moderate mitral regurgitation but otherwise no findings.  She is yet to follow-up with cardiology.  It is unclear how much this is playing into her dyspnea.  She says that since starting Trelegy at last visit her dyspnea somewhat better but she still sounded by dyspnea.  She is got new hoarseness of voice which she thinks is related to Parkinson's but it seems to started a few weeks ago and definitely after starting her Trelegy.  She tells me that even for doing simple tasks like taking a shower or changing clothes she has level for dyspnea.  Even at rest she has level 1 dyspnea.  For walking up stairs or hills she has level 5 dyspnea.  She has significant associated fatigue.  She has a diagnosis of Parkinson's.  She thinks this is also contributing to her dyspnea.   Noed; She had TEE but not cardiac stress test   CAT COPD Symptom & Quality of Life Score (GSK trademark) 0 is no burden. 5 is highest burden 09/04/2018  12/02/2018  Never Cough -> Cough all the time 4 2  No phlegm in chest -> Chest is full of phlegm 3 2  No chest tightness -> Chest feels very tight 4 1  No dyspnea for 1 flight  stairs/hill -> Very dyspneic for 1 flight of stairs 5 3  No limitations for ADL at home -> Very limited with ADL at home 5 4  Confident leaving home -> Not at all confident leaving home 4 1  Sleep soundly -> Do not sleep soundly because of lung condition 5 0  Lots of Energy -> No energy at all 5 5  TOTAL Score (max 40)  32 18       PFTs 10/21/2012   PFT FVC fev1 ratio BD fev1 TLC DLCO comments  10/21/2012  post pulmicort 2.L/78% 1.35L/69% 64/down% 12% improvement in FEv1 to 1.5L 3.9L/85% 14.8/82% Mild Obstruction with posiitive BD response; Normal DLco - c/w asthmatic response    TEE 12/29/2018 IMPRESSIONS    1. Normal LV systolic function with an EF of 60-65%.  2. Moderately dilated left atrial size.  3. Normal right atrial size.  4. Mitral valve regurgitation is moderate by color flow Doppler.  5. Moderate mitral valve prolapse.  6. The mitral valve is myxomatous.  7. Normal tricuspid valve.  8. Aortic valve normal.  9. Pulmonic valve regurgitation is mild by color flow Doppler. 10. Saline contrast bubble study was negative, with no evidence of any interatrial shunt. 11. No atrial level shunt detected by color flow Doppler. 12. The left ventricle appears to be normal in size, with no wall thickness and have normal systolic function of 64-40% ejection fraction.  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[place]), Arthritis, Asthma, Breast cancer ([place]) (2002), Cataract (2019), COPD (chronic obstructive pulmonary disease) ([place]) (9/08), Ductal carcinoma of breast, estrogen receptor positive, stage 1 ([place]) (09/16/2012), Endometriosis, GERD (gastroesophageal reflux disease), Hypercalcemia (09/14/2013), Hypertension, Lesion of breast (1992), Lung disease, Mastalgia (7/95), Osteoporosis, post-menopausal (03/14/2012), Ovarian cancer ([place]) (2004), Pancreatitis, Parkinson disease ([place]) (02/10/15), Post-thoracotomy pain syndrome,  Pseudomonas pneumonia ([place]) (5/09), Uterine fibroid, and Vitamin D deficiency.   reports that she has never smoked. She has never used smokeless tobacco.  Past Surgical History:  Procedure Laterality Date  . 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 APPENDECTOMY  2004  . BREAST BIOPSY  08/2004   right breast-benign  . BREAST LUMPECTOMY  1992   benign  . CARDIAC CATHETERIZATION  2007   essentially negation for significant CAD  . CHOLECYSTECTOMY  2001  . COLONOSCOPY  2014   Dr [name] , due 2019  . ESOPHAGOGASTRODUODENOSCOPY  09/2018   GERD with esophagitis and stricture dilated, antral gastritis negative for H pylori [name])  . LUNG BIOPSY  2002   [name], Dr [name]  . MASTECTOMY MODIFIED RADICAL Left 2002   oral chemotheraphy (tamoxifen then Armidex) no radiation, Dr.[name]  . TEE WITHOUT CARDIOVERSION N/A 12/29/2018   Procedure: TRANSESOPHAGEAL ECHOCARDIOGRAM (TEE);  Surgeon: Wellington Hampshire, MD;  Location: [facility] ORS;  Service: Cardiovascular;  Laterality: N/A;  . TONSILLECTOMY  1946    Allergies  Allergen Reactions  . Sulfonamide Derivatives Anaphylaxis  . Topamax [Topiramate] Other (See Comments)    Metabolic acidosis   . Biaxin [Clarithromycin] Other (See Comments)    pericarditis  . Hydrocodone Other (See Comments)    "hyper and climbing the  walls"  . Motrin [Ibuprofen] Other (See Comments)    headaches  . Symbicort [Budesonide-Formoterol Fumarate] Other (See Comments)    02/07/15 tremor  . Percocet [Oxycodone-Acetaminophen] Itching and Rash  . Tape Itching and Rash    Use paper tape only  . Tetracyclines & Related Other (See Comments)    "immediate yeast infection"    Immunization History  Administered Date(s) Administered  . Influenza Split 09/11/2011  . Influenza Whole 09/09/2007, 08/08/2008, 08/15/2010, 08/26/2012  . Influenza,inj,Quad PF,6+ Mos 08/18/2013, 08/20/2014, 08/25/2018  . Influenza-Unspecified 09/27/2015,  08/26/2017  . Pneumococcal Conjugate-13 02/12/2014  . Pneumococcal Polysaccharide-23 11/26/2006, 10/21/2012  . Td 01/01/2011  . Zoster 05/26/2007  . Zoster Recombinat (Shingrix) 02/22/2018    Family History  Problem Relation Age of Onset  . Lung cancer Father 48       d.82 history of smoking  . Breast cancer Sister 60  . Colon cancer Sister   . Emphysema Mother        d.64 was never a smoker  . Stroke Paternal Aunt        in mid 59s  . Osteoporosis Paternal Aunt   . Melanoma Maternal Aunt   . Cancer Maternal Uncle        unspecified type  . Cancer Maternal Grandmother        d.82s unspecified GI cancer  . Cancer Maternal Grandfather        d.62s unspecified type  . Liver cancer Maternal Aunt   . Cancer Maternal Uncle        unspecified type  . Cancer Maternal Uncle        unspecified type  . Myasthenia gravis Paternal Aunt   . Breast cancer Cousin        d.60s-daughter of unaffected paternal aunt Eustace Moore  . Colon cancer Cousin 51       d.70-daughter of unaffected maternal aunt Leila  . Lung cancer Cousin 70       d.70-sisters to each other, both daughters of maternal uncle Johnny  . Diabetes Neg Hx   . Heart disease Neg Hx   . Stomach cancer Neg Hx   . Ulcerative colitis Neg Hx      Current Outpatient Medications:  .  acetaminophen (TYLENOL) 500 MG tablet, Take 2 tablets (1,000 mg total) by mouth 2 (two) times daily as needed. (Patient taking differently: Take 1,000 mg by mouth 2 (two) times daily as needed for mild pain or moderate pain. ), Disp: , Rfl:  .  Albuterol Sulfate ([name]) 194 (90 Base) MCG/ACT AEPB, Inhale 2 puffs into the lungs every 6 (six) hours as needed (dyspnea, wheezing)., Disp: , Rfl:  .  bisoprolol (ZEBETA) 5 MG tablet, Take 0.5 tablets (2.5 mg total) by mouth daily., Disp: 15 tablet, Rfl: 2 .  carbidopa-levodopa ([name]) 25-100 MG tablet, Take by mouth. 2 in the morning, 1 in the afternoon, and 1 in the evening, Disp: , Rfl:  .   clonazePAM (KLONOPIN) 0.5 MG tablet, Take 0.5 tablets (0.25 mg total) by mouth at bedtime., Disp: 45 tablet, Rfl: 1 .  Fluticasone-Umeclidin-Vilant (TRELEGY ELLIPTA) 100-62.5-25 MCG/INH AEPB, Inhale 1 puff into the lungs daily., Disp: , Rfl:  .  gabapentin ([name]) 300 MG capsule, Take 2 capsules (600 mg total) by mouth 3 (three) times daily., Disp: 540 capsule, Rfl: 3 .  omeprazole (PRILOSEC) 20 MG capsule, Take 1 capsule (20 mg total) by mouth daily., Disp: 30 capsule, Rfl: 11 .  Vitamin D, Ergocalciferol, ([name]) 50000  units CAPS capsule, TAKE 1 CAPSULE BY MOUTH EVERY WEEK (Patient taking differently: Take 50,000 Units by mouth every 7 (seven) days. ), Disp: 12 capsule, Rfl: 3      Objective:   Vitals:   01/13/19 1126 01/13/19 1127  BP:  112/68  Pulse:  93  SpO2:  93%  Weight: 138 lb (62.6 kg)   Height: 5\' 2"  (1.575 m)     Estimated body mass index is 25.24 kg/m as calculated from the following:   Height as of this encounter: 5\' 2"  (1.575 m).   Weight as of this encounter: 138 lb (62.6 kg).  @WEIGHTCHANGE @  [employer]   01/13/19 1126  Weight: 138 lb (62.6 kg)     Physical Exam  General Appearance:    Alert, cooperative, no distress, appears stated age - older , Deconditioned looking - yes , OBESE  - no, Sitting on Wheelchair -  no  Head:    Normocephalic, without obvious abnormality, atraumatic  Eyes:    PERRL, conjunctiva/corneas clear,  Ears:    Normal TM's and external ear canals, both ears  Nose:   Nares normal, septum midline, mucosa normal, no drainage    or sinus tenderness. OXYGEN ON  - no . Patient is @ ra   Throat:   Lips, mucosa, and tongue normal; teeth and gums normal. Cyanosis on lips - no  Neck:   Supple, symmetrical, trachea midline, no adenopathy;    thyroid:  no enlargement/tenderness/nodules; no carotid   bruit or JVD  Back:     Symmetric, no curvature, ROM normal, no CVA tenderness  Lungs:     Distress - no , Wheeze no, Barrell Chest - no,  Purse lip breathing - no, Crackles - no   Chest Wall:    No tenderness or deformity.    Heart:    Regular rate and rhythm, S1 and S2 normal, no rub   or gallop, Murmur - no  Breast Exam:    NOT DONE  Abdomen:     Soft, non-tender, bowel sounds active all four quadrants,    no masses, no organomegaly. Visceral obesity - no  Genitalia:   NOT DONE  Rectal:   NOT DONE  Extremities:   Extremities - normal, Has Cane - no, Clubbing - no, Edema - no  Pulses:   2+ and symmetric all extremities  Skin:   Stigmata of Connective Tissue Disease - no  Lymph nodes:   Cervical, supraclavicular, and axillary nodes normal  Psychiatric:  Neurologic:   Pleasant - yes, Anxious - no, Flat affect - no  CAm-ICU - neg, Alert and Oriented x 3 - yes, Moves all 4s - yes, Speech - normal, Cognition - intact           Assessment:       ICD-10-CM   1. Dyspnea on exertion R06.09 Acetylcholine Receptor Ab, All  2. Chronic fatigue R53.82   3. History of [name] infection Z86.19   4. Chronic obstructive airway disease with asthma ([place]) J44.9   5. Hoarseness of voice R49.0   6. History of pulmonary embolism Z86.711 D-Dimer, Quantitative    D-Dimer, Quantitative       Plan:     Patient Instructions  Dyspnea on exertion Chronic fatigue   - check acetylcholine receptor antibody rule out myasthenia - check d-dimer  - if this is negative will consider pulmonary stress test after I discuss with DR Fletcher Anon  History of [name] infection - observation for now  -  consider bronch in future after getting to bottom of shortness of breath issue  Chronic obstructive airway disease with asthma ([place]) - continue trelegy  - learn technique again from CMA  History PE  - check d-dimer to figure out length of therapy; 3 months appears too short  Hoarseness of voice - if persists refer ENT in future  Followup 3 months or sooner if needed     SIGNATURE    Dr. Brand Males, M.D., F.C.C.P,  Pulmonary and Critical  Care Medicine Staff Physician, [place] Director - Interstitial Lung Disease  Program  Pulmonary [place] [facility] [place], [place], [address]  Pager: [phone], If no answer or between  15:00h - 7:00h: call [phone] Telephone: [phone]  12:55 PM 01/13/2019

## 2019-01-13 NOTE — Patient Instructions (Addendum)
Dyspnea on exertion Chronic fatigue   - check acetylcholine receptor antibody rule out myasthenia - check d-dimer  - if this is negative will consider pulmonary stress test after I discuss with DR Fletcher Anon  History of [name] infection - observation for now  - consider bronch in future after getting to bottom of shortness of breath issue  Chronic obstructive airway disease with asthma ([place]) - continue trelegy  - learn technique again from CMA  History PE  - check d-dimer to figure out length of therapy; 3 months appears too short  Hoarseness of voice - if persists refer ENT in future  Followup 3 months or sooner if needed

## 2019-01-14 ENCOUNTER — Ambulatory Visit
Admission: RE | Admit: 2019-01-14 | Discharge: 2019-01-14 | Disposition: A | Discharge status: Home / Self Care | Payer: Medicare Other | Source: Ambulatory Visit | Attending: Family Medicine | Admitting: Family Medicine

## 2019-01-14 DIAGNOSIS — N281 Cyst of kidney, acquired: Secondary | ICD-10-CM

## 2019-01-19 ENCOUNTER — Ambulatory Visit: Payer: Medicare Other

## 2019-01-19 ENCOUNTER — Other Ambulatory Visit: Payer: Medicare Other

## 2019-01-19 ENCOUNTER — Other Ambulatory Visit: Payer: Self-pay | Admitting: Family Medicine

## 2019-01-19 ENCOUNTER — Ambulatory Visit
Admission: RE | Admit: 2019-01-19 | Discharge: 2019-01-19 | Disposition: A | Discharge status: Home / Self Care | Payer: Medicare Other | Source: Ambulatory Visit | Attending: Obstetrics & Gynecology | Admitting: Obstetrics & Gynecology

## 2019-01-19 ENCOUNTER — Ambulatory Visit (INDEPENDENT_AMBULATORY_CARE_PROVIDER_SITE_OTHER): Payer: Medicare Other

## 2019-01-19 VITALS — BP 118/78 | HR 61 | Temp 97.7°F | Ht 62.5 in | Wt 138.3 lb

## 2019-01-19 DIAGNOSIS — E538 Deficiency of other specified B group vitamins: Secondary | ICD-10-CM

## 2019-01-19 DIAGNOSIS — E559 Vitamin D deficiency, unspecified: Secondary | ICD-10-CM

## 2019-01-19 DIAGNOSIS — E782 Mixed hyperlipidemia: Secondary | ICD-10-CM

## 2019-01-19 DIAGNOSIS — Z0000 Encounter for general adult medical examination without abnormal findings: Secondary | ICD-10-CM

## 2019-01-19 DIAGNOSIS — Z1231 Encounter for screening mammogram for malignant neoplasm of breast: Secondary | ICD-10-CM

## 2019-01-19 LAB — LIPID PANEL
Cholesterol: 181 mg/dL (ref 0–200)
HDL: 57.4 mg/dL (ref >39.00)
LDL Cholesterol: 101 mg/dL — ABNORMAL HIGH (ref 0–99)
NonHDL: 123.94
Total CHOL/HDL Ratio: 3
Triglycerides: 115 mg/dL (ref 0.0–149.0)
VLDL: 23 mg/dL (ref 0.0–40.0)

## 2019-01-19 LAB — VITAMIN B12: VITAMIN B 12: 197 pg/mL — AB (ref 211–911)

## 2019-01-19 LAB — VITAMIN D 25 HYDROXY (VIT D DEFICIENCY, FRACTURES): VITD: 54.39 ng/mL (ref 30.00–100.00)

## 2019-01-19 NOTE — Progress Notes (Signed)
Subjective:   Crystal Bass is a 78 y.o. female who presents for Medicare Annual (Subsequent) preventive examination.  Review of Systems:  N/A Cardiac Risk Factors include: advanced age (>19men, >41 women);dyslipidemia;hypertension     Objective:     Vitals: BP 118/78 (BP Location: Right Arm, Patient Position: Standing, Cuff Size: Normal) Comment: 130/86 RA, sitting, normal  Pulse 61   Temp 97.7 F (36.5 C) (Oral)   Ht 5' 2.5" (1.588 m) Comment: no shoes  Wt 138 lb 4.8 oz (62.7 kg)   SpO2 98%   BMI 24.89 kg/m   Body mass index is 24.89 kg/m.  Advanced Directives 01/19/2019 12/29/2018 10/28/2018 10/26/2018 10/25/2018 08/21/2018 01/14/2018  Does Patient Have a Medical Advance Directive? Yes No Yes Yes Yes Yes Yes  Type of Paramedic of [place];Living will - Out of facility DNR (pink MOST or yellow form);[place];Living will [place];Living will;Out of facility DNR (pink MOST or yellow form) Out of facility DNR (pink MOST or yellow form) [place];Living will [place];Living will  Does patient want to make changes to medical advance directive? No - Patient declined - - No - Patient declined - No - Patient declined -  Copy of [place] in Chart? Yes - validated most recent copy scanned in chart (See row information) - No - copy requested No - copy requested - No - copy requested Yes  Would patient like information on creating a medical advance directive? No - Patient declined No - Patient declined - - - No - Patient declined -  Pre-existing out of facility DNR order (yellow form or pink MOST form) - - Yellow form placed in chart (order not valid for inpatient use) - - - -    Tobacco Social History   Tobacco Use  Smoking Status Never Smoker  Smokeless Tobacco Never Used     Counseling given: No   Clinical Intake:  Pre-visit preparation completed:  Yes  Pain : No/denies pain Pain Score: 0-No pain     Nutritional Status: BMI of 19-24  Normal Nutritional Risks: None Diabetes: No  How often do you need to have someone help you when you read instructions, pamphlets, or other written materials from your doctor or pharmacy?: 1 - Never What is the last grade level you completed in school?: Master degree + additional graduate courses  Interpreter Needed?: No  Comments: pt is a widow and lives alone Information entered by :: LPinson, 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 Aortic root aneurysm ([place])   . Arthritis   . Asthma   . Breast cancer ([place]) 2002   infiltrative ductal carcinoma   . Cataract 2019  . COPD (chronic obstructive pulmonary disease) ([place]) 9/08  . Ductal carcinoma of breast, estrogen receptor positive, stage 1 ([place]) 09/16/2012  . Endometriosis   . GERD (gastroesophageal reflux disease)   . Hypercalcemia 09/14/2013  . Hypertension   . Lesion of breast 1992   right, benign  . Lung disease    secondary to [name] infection  . Mastalgia 7/95  . Osteoporosis, post-menopausal 03/14/2012  . Ovarian cancer ([place]) 2004  . Pancreatitis    secondary to Cholelithiasis  . Parkinson disease ([place]) 02/10/15  . Post-thoracotomy pain syndrome   . Pseudomonas pneumonia ([place]) 5/09  . Uterine fibroid   .  Vitamin D deficiency    Past Surgical History:  Procedure Laterality Date  . 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 APPENDECTOMY  2004  . BREAST BIOPSY  08/2004   right breast-benign  . BREAST LUMPECTOMY  1992   benign  . CARDIAC CATHETERIZATION  2007   essentially negation for significant CAD  . CHOLECYSTECTOMY  2001  . COLONOSCOPY  2014   Dr [name] , due 2019  . ESOPHAGOGASTRODUODENOSCOPY  09/2018   GERD with esophagitis and stricture dilated, antral gastritis negative for H pylori [name])  . LUNG  BIOPSY  2002   [name], Dr [name]  . MASTECTOMY MODIFIED RADICAL Left 2002   oral chemotheraphy (tamoxifen then Armidex) no radiation, Dr.[name]  . TEE WITHOUT CARDIOVERSION N/A 12/29/2018   Procedure: TRANSESOPHAGEAL ECHOCARDIOGRAM (TEE);  Surgeon: Wellington Hampshire, MD;  Location: [facility] ORS;  Service: Cardiovascular;  Laterality: N/A;  . TONSILLECTOMY  1946   Family History  Problem Relation Age of Onset  . Lung cancer Father 41       d.82 history of smoking  . Breast cancer Sister 53  . Colon cancer Sister   . Emphysema Mother        d.64 was never a smoker  . Stroke Paternal Aunt        in mid 8s  . Osteoporosis Paternal Aunt   . Melanoma Maternal Aunt   . Cancer Maternal Uncle        unspecified type  . Cancer Maternal Grandmother        d.82s unspecified GI cancer  . Cancer Maternal Grandfather        d.62s unspecified type  . Liver cancer Maternal Aunt   . Cancer Maternal Uncle        unspecified type  . Cancer Maternal Uncle        unspecified type  . Myasthenia gravis Paternal Aunt   . Breast cancer Cousin        d.60s-daughter of unaffected paternal aunt Eustace Moore  . Colon cancer Cousin 57       d.70-daughter of unaffected maternal aunt Leila  . Lung cancer Cousin 70       d.70-sisters to each other, both daughters of maternal uncle Johnny  . Diabetes Neg Hx   . Heart disease Neg Hx   . Stomach cancer Neg Hx   . Ulcerative colitis Neg Hx    Social History   Socioeconomic History  . Marital status: Widowed    Spouse name: Not on file  . Number of children: 0  . Years of education: Not on file  . Highest education level: Not on file  Occupational History  . 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 Financial resource strain: Not on file  . Food insecurity:    Worry: Not on file    Inability: Not on file  . Transportation needs:    Medical: Not on file    Non-medical: Not on file  Tobacco Use  .  Smoking status: Never Smoker  . Smokeless tobacco: Never Used  Substance and Sexual Activity  . Alcohol use: No    Alcohol/week: 0.0 standard drinks  . Drug use: No  . Sexual activity: Never    Birth control/protection: Surgical    Comment: TAH/BSO  Lifestyle  . Physical activity:    Days per week: Not on file    Minutes per session:  Not on file  . Stress: Not on file  Relationships  . Social connections:    Talks on phone: Not on file    Gets together: Not on file    Attends religious service: Not on file    Active member of club or organization: Not on file    Attends meetings of clubs or organizations: Not on file    Relationship status: Not on file  Other Topics Concern  . 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 acetaminophen (TYLENOL) 500 MG tablet Take 2 tablets (1,000 mg total) by mouth 2 (two) times daily as needed. (Patient taking differently: Take 1,000 mg by mouth 2 (two) times daily as needed for mild pain or moderate pain. )  . Albuterol Sulfate ([name]) 710 (90 Base) MCG/ACT AEPB Inhale 2 puffs into the lungs every 6 (six) hours as needed (dyspnea, wheezing).  . bisoprolol (ZEBETA) 5 MG tablet Take 0.5 tablets (2.5 mg total) by mouth daily.  . carbidopa-levodopa ([name]) 25-100 MG tablet Take by mouth. 2 in the morning, 1 in the afternoon, and 1 in the evening  . clonazePAM (KLONOPIN) 0.5 MG tablet Take 0.5 tablets (0.25 mg total) by mouth at bedtime.  . Fluticasone-Umeclidin-Vilant (TRELEGY ELLIPTA) 100-62.5-25 MCG/INH AEPB Inhale 1 puff into the lungs daily.  [name] gabapentin ([name]) 300 MG capsule Take 2 capsules (600 mg total) by  mouth 3 (three) times daily.  [name] omeprazole (PRILOSEC) 20 MG capsule Take 1 capsule (20 mg total) by mouth daily.  . Vitamin D, Ergocalciferol, ([name]) 50000 units CAPS capsule TAKE 1 CAPSULE BY MOUTH EVERY WEEK (Patient taking differently: Take 50,000 Units by mouth every 7 (seven) days. )   No facility-administered encounter medications on file as of 01/19/2019.     Activities of Daily Living In your present state of health, do you have any difficulty performing the following activities: 01/19/2019 12/29/2018  Hearing? N N  Vision? N N  Difficulty concentrating or making decisions? N N  Walking or climbing stairs? N N  Dressing or bathing? N N  Doing errands, shopping? N -  Preparing Food and eating ? N -  Using the Toilet? N -  In the past six months, have you accidently leaked urine? N -  Do you have problems with loss of bowel control? N -  Managing your Medications? N -  Managing your Finances? N -  Housekeeping or managing your Housekeeping? N -  Some recent data might be hidden    Patient Care Team: [name], MD as PCP - General (Family Medicine) Mosetta Anis, MD as Referring Physician (Allergy) Megan Salon, MD as Consulting Physician (Gynecology) Tat, Eustace Quail, DO as Consulting Physician (Neurology) Nicholas Lose, MD as Consulting Physician (Hematology and Oncology)    Assessment:   This is a routine wellness examination for Kamelia.   Hearing Screening   125Hz  250Hz  500Hz  1000Hz  2000Hz  3000Hz  4000Hz  6000Hz  8000Hz   Right ear:   40 40 40  40    Left ear:   40 40 40  40    Vision Screening Comments: Vision exam in Feb 2020 with Dr.  Dingledein   Exercise Activities and Dietary recommendations Current Exercise Habits: Home exercise routine, Type of exercise: walking, Time (Minutes): 30, Frequency (Times/Week): 7, Weekly Exercise (Minutes/Week): 210, Intensity: Mild, Exercise limited by: None identified  Goals    . Patient Stated     As tolerated, I will  continue to walk 1 mile daily.        Fall Risk Fall Risk  01/19/2019 12/05/2018 07/17/2018 02/27/2018 01/14/2018  Falls in the past year? 1 1 Yes No No  Comment - pt had 1 fall resulting in (3) fractures to R foot and fracture to L patella - - -  Number falls in past yr: 1 0 1 - -  Injury with Fall? 1 1 Yes - -  Comment multiple fractures in left lower extremity - - - -  Risk Factor Category  - - High Fall Risk - -  Follow up - - Falls evaluation completed - -   Depression Screen PHQ 2/9 Scores 01/19/2019 01/14/2018 12/27/2016 02/07/2015  PHQ - 2 Score 0 0 1 0  PHQ- 9 Score 0 0 - -     Cognitive Function MMSE - Mini Mental State Exam 01/19/2019 01/14/2018 06/12/2017 12/27/2016  Not completed: - - Unable to complete -  Orientation to time 5 5 - 5  Orientation to Place 5 5 - 5  Registration 3 3 - 3  Attention/ Calculation 0 0 - 0  Recall 3 3 - 3  Language- name 2 objects 0 0 - 0  Language- repeat 1 1 - 1  Language- follow 3 step command 3 3 - 3  Language- read & follow direction 0 0 - 0  Write a sentence 0 0 - 0  Copy design 0 0 - 0  Total score 20 20 - 20   Montreal Cognitive Assessment  10/02/2016  Visuospatial/ Executive (0/5) 4  Naming (0/3) 3  Attention: Read list of digits (0/2) 2  Attention: Read list of letters (0/1) 1  Attention: Serial 7 subtraction starting at 100 (0/3) 3  Language: Repeat phrase (0/2) 2  Language : Fluency (0/1) 1  Abstraction (0/2) 2  Delayed Recall (0/5) 3  Orientation (0/6) 6  Total 27  Adjusted Score (based on education) 27     PLEASE NOTE: A Mini-Cog screen was completed. Maximum score is 20. A value of 0 denotes this part of [place] MMSE was not completed or the patient failed this part of the Mini-Cog screening.   Mini-Cog Screening Orientation to Time - Max 5 pts Orientation to Place - Max 5 pts Registration - Max 3 pts Recall - Max 3 pts Language Repeat - Max 1 pts Language Follow 3 Step Command - Max 3 pts   Immunization History   Administered Date(s) Administered  . Influenza Split 09/11/2011  . Influenza Whole 09/09/2007, 08/08/2008, 08/15/2010, 08/26/2012  . Influenza,inj,Quad PF,6+ Mos 08/18/2013, 08/20/2014, 08/25/2018  . Influenza-Unspecified 09/27/2015, 08/26/2017  . Pneumococcal Conjugate-13 02/12/2014  . Pneumococcal Polysaccharide-23 11/26/2006, 10/21/2012  . Td 01/01/2011  . Zoster 05/26/2007  . Zoster Recombinat (Shingrix) 02/22/2018    Screening Tests Health Maintenance  Topic Date Due  . MAMMOGRAM  11/05/2018  . DTaP/Tdap/Td (1 - Tdap) 01/01/2021 (Originally 03/13/1952)  . TETANUS/TDAP  01/01/2021  . COLONOSCOPY  10/10/2023  . INFLUENZA VACCINE  Completed  . DEXA SCAN  Completed  . PNA vac Low Risk Adult  Completed     Plan:      I have personally reviewed, addressed, and noted the  following in the patient's chart:  A. Medical and social history B. Use of alcohol, tobacco or illicit drugs  C. Current medications and supplements D. Functional ability and status E.  Nutritional status F.  Physical activity G. Advance directives H. List of other physicians I.  Hospitalizations, surgeries, and ER visits in previous 12 months J.  [place] to include hearing, vision, cognitive, depression L. Referrals and appointments - none  In addition, I have reviewed and discussed with patient certain preventive protocols, quality metrics, and best practice recommendations. A written personalized care plan for preventive services as well as general preventive health recommendations were provided to patient.  See attached scanned questionnaire for additional information.   Signed,   Lindell Noe, MHA, BS, LPN Health Coach

## 2019-01-19 NOTE — Progress Notes (Signed)
PCP notes:   Health maintenance:  Mammogram - future appt scheduled  Abnormal screenings:   Fall risk - hx of multiple falls with and without injury Fall Risk  01/19/2019 12/05/2018 07/17/2018 02/27/2018 01/14/2018  Falls in the past year? 1 1 Yes No No  Comment - pt had 1 fall resulting in (3) fractures to R foot and fracture to L patella - - -  Number falls in past yr: 1 0 1 - -  Injury with Fall? 1 1 Yes - -  Comment multiple fractures in left lower extremity - - - -  Risk Factor Category  - - High Fall Risk - -  Follow up - - Falls evaluation completed - -  .[name]  Patient concerns:   None  Nurse concerns:  None  Next PCP appt:   01/26/29  @ 1130

## 2019-01-19 NOTE — Patient Instructions (Addendum)
Crystal Bass , Thank you for taking time to come for your Medicare Wellness Visit. I appreciate your ongoing commitment to your health goals. Please review the following plan we discussed and let me know if I can assist you in the future.   These are the goals we discussed: Goals    . Patient Stated     As tolerated, I will continue to walk 1 mile daily.        This is a list of the screening recommended for you and due dates:  Health Maintenance  Topic Date Due  . Mammogram  11/05/2018  . DTaP/Tdap/Td vaccine (1 - Tdap) 01/01/2021*  . Tetanus Vaccine  01/01/2021  . Colon Cancer Screening  10/10/2023  . Flu Shot  Completed  . DEXA scan (bone density measurement)  Completed  . Pneumonia vaccines  Completed  *Topic was postponed. The date shown is not the original due date.   Preventive Care for Adults  A healthy lifestyle and preventive care can promote health and wellness. Preventive health guidelines for adults include the following key practices.  . A routine yearly physical is a good way to check with your health care provider about your health and preventive screening. It is a chance to share any concerns and updates on your health and to receive a thorough exam.  . Visit your dentist for a routine exam and preventive care every 6 months. Brush your teeth twice a day and floss once a day. Good oral hygiene prevents tooth decay and gum disease.  . The frequency of eye exams is based on your age, health, family medical history, use  of contact lenses, and other factors. Follow your health care provider's recommendations for frequency of eye exams.  . Eat a healthy diet. Foods like vegetables, fruits, whole grains, low-fat dairy products, and lean protein foods contain the nutrients you need without too many calories. Decrease your intake of foods high in solid fats, added sugars, and salt. Eat the right amount of calories for you. Get information about a proper diet from your  health care provider, if necessary.  . Regular physical exercise is one of the most important things you can do for your health. Most adults should get at least 150 minutes of moderate-intensity exercise (any activity that increases your heart rate and causes you to sweat) each week. In addition, most adults need muscle-strengthening exercises on 2 or more days a week.  Silver Sneakers may be a benefit available to you. To determine eligibility, you may visit the website: www.silversneakers.com or contact program at 949-011-0007 Mon-Fri between 8AM-8PM.   . Maintain a healthy weight. The body mass index (BMI) is a screening tool to identify possible weight problems. It provides an estimate of body fat based on height and weight. Your health care provider can find your BMI and can help you achieve or maintain a healthy weight.   For adults 20 years and older: ? A BMI below 18.5 is considered underweight. ? A BMI of 18.5 to 24.9 is normal. ? A BMI of 25 to 29.9 is considered overweight. ? A BMI of 30 and above is considered obese.   . Maintain normal blood lipids and cholesterol levels by exercising and minimizing your intake of saturated fat. Eat a balanced diet with plenty of fruit and vegetables. Blood tests for lipids and cholesterol should begin at age 32 and be repeated every 5 years. If your lipid or cholesterol levels are high, you are over 50,  or you are at high risk for heart disease, you may need your cholesterol levels checked more frequently. Ongoing high lipid and cholesterol levels should be treated with medicines if diet and exercise are not working.  . If you smoke, find out from your health care provider how to quit. If you do not use tobacco, please do not start.  . If you choose to drink alcohol, please do not consume more than 2 drinks per day. One drink is considered to be 12 ounces (355 mL) of beer, 5 ounces (148 mL) of wine, or 1.5 ounces (44 mL) of liquor.  . If you are  17-69 years old, ask your health care provider if you should take aspirin to prevent strokes.  . Use sunscreen. Apply sunscreen liberally and repeatedly throughout the day. You should seek shade when your shadow is shorter than you. Protect yourself by wearing long sleeves, pants, a wide-brimmed hat, and sunglasses year round, whenever you are outdoors.  . Once a month, do a whole body skin exam, using a mirror to look at the skin on your back. Tell your health care provider of new moles, moles that have irregular borders, moles that are larger than a pencil eraser, or moles that have changed in shape or color.

## 2019-01-20 NOTE — Progress Notes (Signed)
I reviewed health advisor's note, was available for consultation, and agree with documentation and plan.  

## 2019-01-21 ENCOUNTER — Other Ambulatory Visit: Payer: Medicare Other

## 2019-01-23 LAB — ACETYLCHOLINE RECEPTOR AB, ALL
AChR Binding Ab, Serum: <0.03 nmol/L (ref 0.00–0.24)
Acetylchol Block Ab: 24 % (ref 0–25)
Acetylcholine Modulat Ab: <12 % (ref 0–20)

## 2019-01-27 ENCOUNTER — Ambulatory Visit (INDEPENDENT_AMBULATORY_CARE_PROVIDER_SITE_OTHER): Payer: Medicare Other | Admitting: Family Medicine

## 2019-01-27 ENCOUNTER — Encounter: Payer: Self-pay | Admitting: Family Medicine

## 2019-01-27 VITALS — BP 118/78 | HR 62 | Temp 97.6°F | Ht 62.5 in | Wt 137.5 lb

## 2019-01-27 DIAGNOSIS — E559 Vitamin D deficiency, unspecified: Secondary | ICD-10-CM

## 2019-01-27 DIAGNOSIS — Z7189 Other specified counseling: Secondary | ICD-10-CM

## 2019-01-27 DIAGNOSIS — E782 Mixed hyperlipidemia: Secondary | ICD-10-CM

## 2019-01-27 DIAGNOSIS — G20 Parkinson's disease: Secondary | ICD-10-CM

## 2019-01-27 DIAGNOSIS — E538 Deficiency of other specified B group vitamins: Secondary | ICD-10-CM | POA: Diagnosis not present

## 2019-01-27 DIAGNOSIS — Z0001 Encounter for general adult medical examination with abnormal findings: Secondary | ICD-10-CM

## 2019-01-27 DIAGNOSIS — N281 Cyst of kidney, acquired: Secondary | ICD-10-CM

## 2019-01-27 DIAGNOSIS — I10 Essential (primary) hypertension: Secondary | ICD-10-CM

## 2019-01-27 DIAGNOSIS — I2699 Other pulmonary embolism without acute cor pulmonale: Secondary | ICD-10-CM

## 2019-01-27 DIAGNOSIS — I951 Orthostatic hypotension: Secondary | ICD-10-CM

## 2019-01-27 DIAGNOSIS — R0609 Other forms of dyspnea: Secondary | ICD-10-CM

## 2019-01-27 DIAGNOSIS — R5382 Chronic fatigue, unspecified: Secondary | ICD-10-CM

## 2019-01-27 DIAGNOSIS — Z66 Do not resuscitate: Secondary | ICD-10-CM

## 2019-01-27 DIAGNOSIS — M810 Age-related osteoporosis without current pathological fracture: Secondary | ICD-10-CM

## 2019-01-27 DIAGNOSIS — G20A1 Parkinson's disease without dyskinesia, without mention of fluctuations: Secondary | ICD-10-CM

## 2019-01-27 DIAGNOSIS — R0600 Dyspnea, unspecified: Secondary | ICD-10-CM

## 2019-01-27 DIAGNOSIS — I719 Aortic aneurysm of unspecified site, without rupture: Secondary | ICD-10-CM

## 2019-01-27 MED ORDER — CYANOCOBALAMIN 1000 MCG/ML IJ SOLN
1000.0000 ug | Freq: Once | INTRAMUSCULAR | Status: AC
  Administered 2019-01-27: 1000 ug via INTRAMUSCULAR

## 2019-01-27 MED ORDER — ASPIRIN 81 MG PO TABS: 81.0000 mg | ORAL_TABLET | Freq: Every day | ORAL | Status: DC

## 2019-01-27 NOTE — Assessment & Plan Note (Signed)
Appreciate neuro care.

## 2019-01-27 NOTE — Progress Notes (Signed)
BP 118/78 (BP Location: Left Arm, Patient Position: Sitting, Cuff Size: Normal)   Pulse 62   Temp 97.6 F (36.4 C) (Oral)   Ht 5' 2.5" (1.588 m)   Wt 137 lb 8 oz (62.4 kg)   SpO2 96%   BMI 24.75 kg/m    CC: CPE Subjective:    Patient ID: Crystal Bass, female    DOB: October 21, 1941, 78 y.o.   MRN: [id number]  HPI: Crystal Bass is a 78 y.o. female presenting on 01/27/2019 for Annual Exam (Pt 2.)   Saw [name] last week for medicare wellness visit. Note reviewed. high fall risk.  Latest fall was 6 wks ago without injury. She is not using compression stockings or abdominal binder.   Had TEE 12/2018 - showing EF 60%, mod dilated LA, mod MVR and MVP, myxomatous valve.   Preventative: COLONOSCOPY 09/2018 - 2 TA removed, no f/u needed (Dr [name])  ESOPHAGOGASTRODUODENOSCOPY 09/2018 - GERD with esophagitis and stricture dilated, antral gastritis negative for H pylori [name]) Breast cancer screening - h/o breast cancer s/p L mastectomy 2002. R mammogram WNL 12/2018  Well woman exam - with OBGYN Dr Sabra Heck. 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 Last seen 11/2018. Lung cancer screening - not eligible DEXA scan - known osteoporosis previously on zometa (followed by Dr Payton Mccallum at cancer center). Pending reschedule.  Flu shot yearly Td 2012 Pneumovax 2013, prevnar 2015 zostavax 2008 [facility] still waiting on 2nd dose.  Reviewed advanced directive and scanned 01/2018 - HCPOA is [place]. Does not want life prolonging measures if terminally ill.  Seat belt use discussed Sunscreen use discussed. No changing moles on skin. Sees derm. 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 Interim medical history since our last visit reviewed. Allergies and medications reviewed and updated. Outpatient Medications Prior to Visit  Medication Sig Dispense Refill  . acetaminophen (TYLENOL) 500 MG tablet Take 2 tablets (1,000 mg total) by mouth 2 (two) times daily as needed. (Patient taking differently: Take 1,000 mg by mouth 2 (two) times daily as needed for mild pain or moderate pain. )    . Albuterol Sulfate ([name]) 185 (90 Base) MCG/ACT AEPB Inhale 2 puffs into the lungs every 6 (six) hours as needed (dyspnea, wheezing).    . bisoprolol (ZEBETA) 5 MG tablet Take 0.5 tablets (2.5 mg total) by mouth daily. 15 tablet 2  . carbidopa-levodopa ([name]) 25-100 MG tablet Take by mouth. 2 in the morning, 1 in the afternoon, and 1 in the evening    . clonazePAM (KLONOPIN) 0.5 MG tablet Take 0.5 tablets (0.25 mg total) by mouth at bedtime. 45 tablet 1  . Fluticasone-Umeclidin-Vilant (TRELEGY ELLIPTA) 100-62.5-25 MCG/INH AEPB Inhale 1 puff into the lungs daily.    [name] gabapentin ([name]) 300 MG capsule Take 2 capsules (600 mg total) by mouth 3 (three) times daily. 540 capsule 3  . omeprazole (PRILOSEC) 20 MG capsule Take 1 capsule (20 mg total) by mouth daily. 30 capsule 11  . Vitamin D, Ergocalciferol, ([name]) 50000 units CAPS capsule TAKE 1 CAPSULE BY MOUTH EVERY WEEK (Patient taking  differently: Take 50,000 Units by mouth every 7 (seven) days. ) 12 capsule 3   No facility-administered medications prior to visit.      Per HPI unless specifically indicated in ROS section below Review of Systems  Constitutional: Negative for activity change, appetite change, chills, fatigue, fever and unexpected weight change.  HENT: Negative for hearing loss.   Eyes: Negative for visual disturbance.  Respiratory: Positive for shortness of breath. Negative for cough, chest tightness and  wheezing.   Cardiovascular: Negative for chest pain, palpitations and leg swelling.  Gastrointestinal: Positive for abdominal pain (chronic RUQ discomfort). Negative for abdominal distention, blood in stool, constipation, diarrhea, nausea and vomiting.  Genitourinary: Negative for difficulty urinating and hematuria.  Musculoskeletal: Negative for arthralgias, myalgias and neck pain.  Skin: Negative for rash.  Neurological: Positive for dizziness and headaches. Negative for seizures and syncope.  Hematological: Negative for adenopathy. Does not bruise/bleed easily.  Psychiatric/Behavioral: Negative for dysphoric mood. The patient is not nervous/anxious.    Objective:    BP 118/78 (BP Location: Left Arm, Patient Position: Sitting, Cuff Size: Normal)   Pulse 62   Temp 97.6 F (36.4 C) (Oral)   Ht 5' 2.5" (1.588 m)   Wt 137 lb 8 oz (62.4 kg)   SpO2 96%   BMI 24.75 kg/m   Wt Readings from Last 3 Encounters:  01/27/19 137 lb 8 oz (62.4 kg)  01/19/19 138 lb 4.8 oz (62.7 kg)  01/13/19 138 lb (62.6 kg)    Physical Exam Vitals signs and nursing note reviewed.  Constitutional:      General: She is not in acute distress.    Appearance: Normal appearance. She is well-developed.  HENT:     Head: Normocephalic and atraumatic.     Right Ear: Hearing, tympanic membrane, ear canal and external ear normal.     Left Ear: Hearing, tympanic membrane, ear canal and external ear normal.     Nose: Nose normal.     Mouth/Throat:     Mouth: Mucous membranes are moist.     Pharynx: Uvula midline. No oropharyngeal exudate or posterior oropharyngeal erythema.  Eyes:     General: No scleral icterus.    Conjunctiva/sclera: Conjunctivae normal.     Pupils: Pupils are equal, round, and reactive to light.  Neck:     Musculoskeletal: Normal range of motion and neck supple.     Vascular: No carotid bruit.  Cardiovascular:     Rate and Rhythm: Normal rate and regular rhythm.     Pulses: Normal pulses.           Radial pulses are 2+ on the right side and 2+ on the left side.     Heart sounds: Normal heart sounds. No murmur.  Pulmonary:     Effort: Pulmonary effort is normal. No respiratory distress.     Breath sounds: Normal breath sounds. No wheezing, rhonchi or rales.  Abdominal:     General: Bowel sounds are normal. There is no distension.     Palpations: Abdomen is soft. There is no mass.     Tenderness: There is no abdominal tenderness. There is no guarding or rebound.  Musculoskeletal: Normal range of motion.  Lymphadenopathy:     Cervical: No cervical adenopathy.  Skin:    General: Skin is warm and dry.     Findings: No rash.  Neurological:     Mental Status: She is alert and oriented to person, place, and time.     Comments: CN grossly  intact, station and gait intact  Psychiatric:        Mood and Affect: Mood normal.        Behavior: Behavior normal.        Thought Content: Thought content normal.        Judgment: Judgment normal.       Results for orders placed or performed in visit on 01/19/19  Vitamin B12  Result Value Ref Range   Vitamin B-12 197 (L) 211 - 911 pg/mL  VITAMIN D 25 Hydroxy (Vit-D Deficiency, Fractures)  Result Value Ref Range   VITD 54.39 30.00 - 100.00 ng/mL  Lipid panel  Result Value Ref Range   Cholesterol 181 0 - 200 mg/dL   Triglycerides 115.0 0.0 - 149.0 mg/dL   HDL 57.40 >39.00 mg/dL   VLDL 23.0 0.0 - 40.0 mg/dL   LDL Cholesterol 101 (H) 0 - 99 mg/dL   Total CHOL/HDL Ratio 3    NonHDL 123.94    Assessment & Plan:   Problem List Items Addressed This Visit    Vitamin D deficiency    Continue vit D 50,000 IU weekly.       Vitamin B12 deficiency    Not taking oral B12. Start injections - first one today. Then start monthly shots for 6 mo then recheck levels.  Consider IF level next labs.       Relevant Medications   cyanocobalamin ((VITAMIN B-12)) injection 1,000 mcg (Completed)   Renal cyst, acquired, right    Stable on latest  renal US 12/2018      Pulmonary embolism and infarction [facility])    Completed 3 months of eliquis. Now back on aspirin 81mg  daily.  F/u D dimer was normal last month.       Relevant Medications   aspirin 81 MG tablet   Parkinson disease ([place])    Appreciate neuro care.       Osteoporosis, post-menopausal    Upcoming DEXA through cancer center.       Orthostatic hypotension   Relevant Medications   aspirin 81 MG tablet   Mixed hyperlipidemia    Chronic, stable. Not on statin.  The 10-year ASCVD risk score Mikey Bussing DC Brooke Bonito., et al., 2013) is: 18%   Values used to calculate the score:     Age: 89 years     Sex: Female     Is Non-Hispanic African American: No     Diabetic: No     Tobacco smoker: No     Systolic Blood Pressure: 401 mmHg     Is BP treated: Yes     HDL Cholesterol: 57.4 mg/dL     Total Cholesterol: 181 mg/dL       Relevant Medications   aspirin 81 MG tablet   Essential hypertension    Chronic, stable on low dose bisoprolol.       Relevant Medications   aspirin 81 MG tablet   Encounter for general adult medical examination with abnormal findings - Primary    Preventative protocols reviewed and updated unless pt declined. Discussed healthy diet and lifestyle.       DNR (do not resuscitate)   Chronic fatigue    ?B12 related - reassess after replacement.       Chronic dyspnea    Appreciate pulm, cards care.       Aortic aneurysm (HCC)    Continue to monitor. Good BP control noted today (in h/o orthostasis). Recent echocardiogram reassuring.       Relevant Medications  aspirin 81 MG tablet   Advanced care planning/counseling discussion    Reviewed advanced directive and scanned 01/2018 - HCPOA is [place]. Does not want life prolonging measures if terminally ill. Will bring [place] updated directives.           Meds ordered this encounter  Medications  . aspirin 81 MG tablet    Sig: Take 1 tablet (81 mg total) by mouth daily.    Dispense:  30 tablet   . cyanocobalamin ((VITAMIN B-12)) injection 1,000 mcg   No orders of the defined types were placed in this encounter.   Patient instructions: [facility].  Start vitamin B12 shots monthly - first one today, then schedule nurse visit once a month for 6 months, then we will recheck levels.  Bring [place] copy of latest advanced directives to update your chart. You are doing well today Return as needed or in 6 months for follow up visit.   Follow up plan: Return in about 6 months (around 07/30/2019) for follow up visit.  [name], MD

## 2019-01-27 NOTE — Assessment & Plan Note (Addendum)
Completed 3 months of eliquis. Now back on aspirin 81mg  daily.  F/u D dimer was normal last month.

## 2019-01-27 NOTE — Assessment & Plan Note (Signed)
Continue to monitor. Good BP control noted today (in h/o orthostasis). Recent echocardiogram reassuring.

## 2019-01-27 NOTE — Assessment & Plan Note (Signed)
Upcoming DEXA through cancer center.

## 2019-01-27 NOTE — Assessment & Plan Note (Signed)
Appreciate pulm, cards care.

## 2019-01-27 NOTE — Assessment & Plan Note (Signed)
Stable on latest renal US 12/2018

## 2019-01-27 NOTE — Assessment & Plan Note (Signed)
Preventative protocols reviewed and updated unless pt declined. Discussed healthy diet and lifestyle.  

## 2019-01-27 NOTE — Assessment & Plan Note (Addendum)
Not taking oral B12. Start injections - first one today. Then start monthly shots for 6 mo then recheck levels.  Consider IF level next labs.

## 2019-01-27 NOTE — Assessment & Plan Note (Addendum)
Reviewed advanced directive and scanned 01/2018 - HCPOA is [place]. Does not want life prolonging measures if terminally ill. Will bring [place] updated directives.

## 2019-01-27 NOTE — Assessment & Plan Note (Signed)
Chronic, stable on low dose bisoprolol.

## 2019-01-27 NOTE — Assessment & Plan Note (Addendum)
Chronic, stable. Not on statin.  The 10-year ASCVD risk score Mikey Bussing DC Brooke Bonito., et al., 2013) is: 18%   Values used to calculate the score:     Age: 78 years     Sex: Female     Is Non-Hispanic African American: No     Diabetic: No     Tobacco smoker: No     Systolic Blood Pressure: 426 mmHg     Is BP treated: Yes     HDL Cholesterol: 57.4 mg/dL     Total Cholesterol: 181 mg/dL

## 2019-01-27 NOTE — Patient Instructions (Addendum)
[facility].  Start vitamin B12 shots monthly - first one today, then schedule nurse visit once a month for 6 months, then we will recheck levels.  Bring [place] copy of latest advanced directives to update your chart. You are doing well today Return as needed or in 6 months for follow up visit.   Health Maintenance After Age 76 After age 69, you are at a higher risk for certain long-term diseases and infections as well as injuries from falls. Falls are a major cause of broken bones and head injuries in people who are older than age 70. Getting regular preventive care can help to keep you healthy and well. Preventive care includes getting regular testing and making lifestyle changes as recommended by your health care provider. Talk with your health care provider about:  Which screenings and tests you should have. A screening is a test that checks for a disease when you have no symptoms.  A diet and exercise plan that is right for you. What should I know about screenings and tests to prevent falls? Screening and testing are the best ways to find a health problem early. Early diagnosis and treatment give you the best chance of managing medical conditions that are common after age 40. Certain conditions and lifestyle choices may make you more likely to have a fall. Your health care provider may recommend:  Regular vision checks. Poor vision and conditions such as cataracts can make you more likely to have a fall. If you wear glasses, make sure to get your prescription updated if your vision changes.  Medicine review. Work with your health care provider to regularly review all of the medicines you are taking, including over-the-counter medicines. Ask your health care provider about any side effects that may make you more likely to have a fall. Tell your health care provider if any medicines that you take make you feel dizzy or sleepy.  Osteoporosis screening. Osteoporosis  is a condition that causes the bones to get weaker. This can make the bones weak and cause them to break more easily.  Blood pressure screening. Blood pressure changes and medicines to control blood pressure can make you feel dizzy.  Strength and balance checks. Your health care provider may recommend certain tests to check your strength and balance while standing, walking, or changing positions.  Foot health exam. Foot pain and numbness, as well as not wearing proper footwear, can make you more likely to have a fall.  Depression screening. You may be more likely to have a fall if you have a fear of falling, feel emotionally low, or feel unable to do activities that you used to do.  Alcohol use screening. Using too much alcohol can affect your balance and may make you more likely to have a fall. What actions can I take to lower my risk of falls? General instructions  Talk with your health care provider about your risks for falling. Tell your health care provider if: ? You fall. Be sure to tell your health care provider about all falls, even ones that seem minor. ? You feel dizzy, sleepy, or off-balance.  Take over-the-counter and prescription medicines only as told by your health care provider. These include any supplements.  Eat a healthy diet and maintain a healthy weight. A healthy diet includes low-fat dairy products, low-fat (lean) meats, and fiber from whole grains, beans, and lots of fruits and vegetables. Home safety  Remove any tripping hazards, such as  rugs, cords, and clutter.  Install safety equipment such as grab bars in bathrooms and safety rails on stairs.  Keep rooms and walkways well-lit. Activity   Follow a regular exercise program to stay fit. This will help you maintain your balance. Ask your health care provider what types of exercise are appropriate for you.  If you need a cane or walker, use it as recommended by your health care provider.  Wear supportive  shoes that have nonskid soles. Lifestyle  Do not drink alcohol if your health care provider tells you not to drink.  If you drink alcohol, limit how much you have: ? 0-1 drink a day for women. ? 0-2 drinks a day for men.  Be aware of how much alcohol is in your drink. In the [place] one drink equals one typical bottle of beer (12 oz), one-half glass of wine (5 oz), or one shot of hard liquor (1 oz).  Do not use any products that contain nicotine or tobacco, such as cigarettes and e-cigarettes. If you need help quitting, ask your health care provider. Summary  Having a healthy lifestyle and getting preventive care can help to protect your health and wellness after age 50.  Screening and testing are the best way to find a health problem early and help you avoid having a fall. Early diagnosis and treatment give you the best chance for managing medical conditions that are more common for people who are older than age 59.  Falls are a major cause of broken bones and head injuries in people who are older than age 53. Take precautions to prevent a fall at home.  Work with your health care provider to learn what changes you can make to improve your health and wellness and to prevent falls. This information is not intended to replace advice given to you by your health care provider. Make sure you discuss any questions you have with your health care provider. Document Released: 09/25/2017 Document Revised: 09/25/2017 Document Reviewed: 09/25/2017 Elsevier Interactive Patient Education  2019 [employer].

## 2019-01-27 NOTE — Assessment & Plan Note (Signed)
?  B12 related - reassess after replacement.

## 2019-01-27 NOTE — Assessment & Plan Note (Signed)
Continue vit D 50,000 IU weekly.

## 2019-02-12 ENCOUNTER — Other Ambulatory Visit: Payer: Self-pay | Admitting: Family Medicine

## 2019-02-13 NOTE — Telephone Encounter (Signed)
Gabapentin Last filled:  11/17/18, #540 Last OV:  02/23/19, CPE Pt 2 Next OV:  08/04/19, 6 mo f/u

## 2019-02-16 ENCOUNTER — Other Ambulatory Visit: Payer: Self-pay

## 2019-02-16 ENCOUNTER — Other Ambulatory Visit: Payer: Self-pay | Admitting: Cardiovascular Disease

## 2019-02-16 ENCOUNTER — Other Ambulatory Visit: Payer: Self-pay | Admitting: Obstetrics & Gynecology

## 2019-02-16 ENCOUNTER — Telehealth: Payer: Self-pay | Admitting: Neurology

## 2019-02-16 MED ORDER — VITAMIN D (ERGOCALCIFEROL) 1.25 MG (50000 UNIT) PO CAPS: 50000.0000 [IU] | ORAL_CAPSULE | ORAL | 2 refills | Status: DC

## 2019-02-16 NOTE — Telephone Encounter (Signed)
Patient made aware RX was sent at beginning of February for 90 day supply with 1 refill. She expressed understanding. She was trying to fill early so that she wouldn't have to go out, but can not do this with controlled medications.

## 2019-02-16 NOTE — Telephone Encounter (Signed)
Patient is needing refill on the Clonazepam to pharm on file. 90 day supply. Please send. Thanks!

## 2019-02-23 ENCOUNTER — Telehealth: Payer: Self-pay

## 2019-02-23 NOTE — Telephone Encounter (Addendum)
Telephone E-visit consent obtained verbally YOUR CARDIOLOGY TEAM HAS ARRANGED FOR AN E-VISIT FOR YOUR APPOINTMENT - PLEASE REVIEW IMPORTANT INFORMATION BELOW SEVERAL DAYS PRIOR TO YOUR APPOINTMENT  Due to the recent COVID-19 pandemic, we are transitioning in-person office visits to tele-medicine visits in an effort to decrease unnecessary exposure to our patients and staff. Medicare and most insurances are covering these visits without a copay needed. You will need a smartphone if possible. We also encourage you to sign up for MyChart. For patients that do not have this, we can still complete the visit using a regular telephone but do prefer a smartphone to enable video when possible. You may have a close family member that can help. If possible, we also ask that you have a blood pressure cuff and scale at home to measure your blood pressure, heart rate and weight prior to your scheduled appointment. Patients with clinical needs that need an in-person evaluation and testing will still be able to come to the office if absolutely necessary. If you have any questions, feel free to call our office.    IF YOU HAVE A SMARTPHONE, PLEASE DOWNLOAD THE WEBEX APP TO YOUR SMARTPHONE  - If Apple, go to [employer] and type in WebEx in the search bar. [place] [employer], the blue/green circle. The app is free but as with any other app download, your phone may require you to verify saved payment information or Apple password. You do NOT have to create a WebEx account.  - If Android, go to [employer] and type in [employer] in the search bar. [place] [employer], the blue/green circle. The app is free but as with any other app download, your phone may require you to verify saved payment information or Android password. You do NOT have to create a WebEx account.  It is very helpful to have this downloaded before your visit.    2-3 DAYS BEFORE YOUR APPOINTMENT  You will receive a telephone call  from one of our [place] team members - your caller ID may say "Unknown caller." If this is a video visit, we will confirm that you have been able to download the WebEx app. We will remind you check your blood pressure, heart rate and weight prior to your scheduled appointment. If you have an Apple Watch or Kardia, please upload any pertinent ECG strips the day before or morning of your appointment to [facility]. Our staff will also make sure you have reviewed the consent and agree to move forward with your scheduled tele-health visit.     THE DAY OF YOUR APPOINTMENT  Approximately 15 minutes prior to your scheduled appointment, you will receive a telephone call from one of [facility] team - your caller ID may say "Unknown caller."  Our staff will confirm medications, vital signs for the day and any symptoms you may be experiencing. Please have this information available prior to the time of visit start. It may also be helpful for you to have a pad of paper and pen handy for any instructions given during your visit. They will also walk you through joining the WebEx smartphone meeting if this is a video visit.    CONSENT FOR TELE-HEALTH VISIT - PLEASE RVIEW  I hereby voluntarily request, consent and authorize [facility] and its employed or contracted physicians, physician assistants, nurse practitioners or other licensed health care professionals (the Practitioner), to provide me with telemedicine health care services (the "Services") as deemed necessary by the treating Practitioner.  I acknowledge and consent to receive the Services by the Practitioner via telemedicine. I understand that the telemedicine visit will involve communicating with the Practitioner through live audiovisual communication technology and the disclosure of certain medical information by electronic transmission. I acknowledge that I have been given the opportunity to request an in-person assessment or other available alternative  prior to the telemedicine visit and am voluntarily participating in the telemedicine visit.  I understand that I have the right to withhold or withdraw my consent to the use of telemedicine in the course of my care at any time, without affecting my right to future care or treatment, and that the Practitioner or I may terminate the telemedicine visit at any time. I understand that I have the right to inspect all information obtained and/or recorded in the course of the telemedicine visit and may receive copies of available information for a reasonable fee.  I understand that some of the potential risks of receiving the Services via telemedicine include:  [name] Delay or interruption in medical evaluation due to technological equipment failure or disruption; . Information transmitted may not be sufficient (e.g. poor resolution of images) to allow for appropriate medical decision making by the Practitioner; and/or  . In rare instances, security protocols could fail, causing a breach of personal health information.  Furthermore, I acknowledge that it is my responsibility to provide information about my medical history, conditions and care that is complete and accurate to the best of my ability. I acknowledge that Practitioner's advice, recommendations, and/or decision may be based on factors not within their control, such as incomplete or inaccurate data provided by me or distortions of diagnostic images or specimens that may result from electronic transmissions. I understand that the practice of medicine is not an exact science and that Practitioner makes no warranties or guarantees regarding treatment outcomes. I acknowledge that I will receive a copy of this consent concurrently upon execution via email to the email address I last provided but may also request a printed copy by calling the office of [place].    I understand that my insurance will be billed for this visit.   I have read or had this  consent read to me. . I understand the contents of this consent, which adequately explains the benefits and risks of the Services being provided via telemedicine.  . I have been provided ample opportunity to ask questions regarding this consent and the Services and have had my questions answered to my satisfaction. . I give my informed consent for the services to be provided through the use of telemedicine in my medical care  By participating in this telemedicine visit I agree to the above. Yes

## 2019-02-26 ENCOUNTER — Telehealth (INDEPENDENT_AMBULATORY_CARE_PROVIDER_SITE_OTHER): Payer: Medicare Other | Admitting: Cardiovascular Disease

## 2019-02-26 ENCOUNTER — Encounter: Payer: Self-pay | Admitting: Cardiovascular Disease

## 2019-02-26 ENCOUNTER — Other Ambulatory Visit: Payer: Self-pay

## 2019-02-26 ENCOUNTER — Ambulatory Visit: Payer: Medicare Other | Admitting: Cardiovascular Disease

## 2019-02-26 VITALS — BP 170/100 | HR 64 | Ht 62.5 in | Wt 137.0 lb

## 2019-02-26 DIAGNOSIS — I059 Rheumatic mitral valve disease, unspecified: Secondary | ICD-10-CM

## 2019-02-26 DIAGNOSIS — G903 Multi-system degeneration of the autonomic nervous system: Secondary | ICD-10-CM

## 2019-02-26 NOTE — Patient Instructions (Signed)
Medication Instructions:  Continue same medications If you need a refill on your cardiac medications before your next appointment, please call your pharmacy.   Lab work: None If you have labs (blood work) drawn today and your tests are completely normal, you will receive your results only by: [name] MyChart Message (if you have MyChart) OR . A paper copy in the mail If you have any lab test that is abnormal or we need to change your treatment, we will call you to review the results.  Testing/Procedures: None  Follow-Up: At [facility], you and your health needs are our priority.  As part of our continuing mission to provide you with exceptional heart care, we have created designated Provider Care Teams.  These Care Teams include your primary Cardiologist (physician) and Advanced Practice Providers (APPs -  Physician Assistants and Nurse Practitioners) who all work together to provide you with the care you need, when you need it. You will need a follow up appointment in 3 months.  Please call our office 2 months in advance to schedule this appointment.  You may see [name], MD or one of the following Advanced Practice Providers on your designated Care Team:   Murray Hodgkins, NP Christell Faith, PA-C . Marrianne Mood, PA-C  Any Other Special Instructions Will Be Listed Below (If Applicable).

## 2019-02-26 NOTE — Progress Notes (Signed)
Virtual Visit via Telephone Note    Evaluation Performed:  Follow-up visit  This visit type was conducted due to national recommendations for restrictions regarding the COVID-19 Pandemic (e.g. social distancing).  This format is felt to be most appropriate for this patient at this time.  All issues noted in this document were discussed and addressed.  No physical exam was performed (except for noted visual exam findings with Video Visits).  Please refer to the patient's chart (MyChart message for video visits and phone note for telephone visits) for the patient's consent to telehealth for [facility].  Date:  02/26/2019   ID:  Crystal Bass, DOB July 13, 1941, MRN [id number]  Patient Location:    Provider location:   [place]  PCP:  [name], MD  Cardiologist:  [name], MD  Electrophysiologist:  None   Chief Complaint: Follow-up visit  History of Present Illness:    Crystal Bass is a 78 y.o. female who presents via audio/video conferencing for a telehealth visit today.  She is following up regarding mitral valve prolapse with moderate regurgitation, exertional dyspnea and orthostatic hypotension in the setting of Parkinson's disease. She was diagnosed with Parkinson's in 2015 but reports prolonged history of orthostatic dizziness. She used to be on multiple blood pressure medications which were discontinued due to orthostatic hypotension.  She has known history of COPD.  The patient had prior cardiac catheterization in 2012 by Dr. [name] which showed normal coronary arteries and ejection fraction and normal renal arteries. There has been concern about decreasing her antihypertensive medications given the presence of an ascending aortic aneurysm at 4 cm.  Fortunately, this has been stable on serial imaging.   She was seen by Dr. Chase Caller for evaluation of cough and exertional dyspnea.  There is concern about possible underlying coronary artery disease  given the presence of coronary artery calcification on previous CT imaging. Echocardiogram in October 2019 showed normal LV systolic function, grade 1 diastolic dysfunction, anterior mitral valve leaflet prolapse with moderate to severe regurgitation.  Left atrium was moderately to severely dilated.    She was hospitalized in early December, 2019 with atypical chest pain.  Initial CTA of the chest showed single small pulmonary embolism in the apical segmental branch of the left upper lobe.  There was also evidence of bronchiectasis.  Repeat CT after 2 days showed resolution of the pulmonary embolus.  The patient was treated with Eliquis for 3 months.    She continued to have exertional dyspnea and there was concern about the degree of mitral regurgitation.  I proceeded with a transesophageal echocardiogram in February which showed that her mitral regurgitation was moderate.  Ejection fraction was normal.  The plan was to proceed with [facility] but that is currently on hold due to COVID-19.   The patient does not have symptoms concerning for COVID-19 infection (fever, chills, cough, or new shortness of breath).    Prior CV studies:   The following studies were reviewed today:    Past Medical History:  Diagnosis Date  . 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 Aortic root aneurysm ([place])   . Arthritis   . Asthma   . Breast cancer ([place]) 2002   infiltrative ductal carcinoma   . Cataract 2019  . COPD (chronic obstructive pulmonary disease) ([place]) 9/08  . Ductal carcinoma of breast, estrogen receptor positive, stage 1 ([place]) 09/16/2012  . Endometriosis   . GERD (  gastroesophageal reflux disease)   . Hypercalcemia 09/14/2013  . Hypertension   . Lesion of breast 1992   right, benign  . Lung disease    secondary to [name] infection  . Mastalgia 7/95  . Osteoporosis, post-menopausal 03/14/2012  . Ovarian cancer ([place]) 2004  . Pancreatitis    secondary to  Cholelithiasis  . Parkinson disease ([place]) 02/10/15  . Post-thoracotomy pain syndrome   . Pseudomonas pneumonia ([place]) 5/09  . Uterine fibroid   . Vitamin D deficiency    Past Surgical History:  Procedure Laterality Date  . 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 APPENDECTOMY  2004  . BREAST BIOPSY  08/2004   right breast-benign  . BREAST LUMPECTOMY  1992   benign  . CARDIAC CATHETERIZATION  2007   essentially negation for significant CAD  . CHOLECYSTECTOMY  2001  . COLONOSCOPY  2014   Dr [name] , due 2019  . COLONOSCOPY  09/2018   2 TA removed, int hem, no f/u needed (Dr [name])   . ESOPHAGOGASTRODUODENOSCOPY  09/2018   GERD with esophagitis and stricture dilated, antral gastritis negative for H pylori [name])  . LUNG BIOPSY  2002   [name], Dr [name]  . MASTECTOMY MODIFIED RADICAL Left 2002   oral chemotheraphy (tamoxifen then Armidex) no radiation, Dr.[name]  . TEE WITHOUT CARDIOVERSION N/A 12/29/2018   Procedure: TRANSESOPHAGEAL ECHOCARDIOGRAM (TEE);  Surgeon: Wellington Hampshire, MD;  Location: [facility] ORS;  Service: Cardiovascular;  Laterality: N/A;  . TONSILLECTOMY  1946     Current Meds  Medication Sig  . acetaminophen (TYLENOL) 500 MG tablet Take 2 tablets (1,000 mg total) by mouth 2 (two) times daily as needed. (Patient taking differently: Take 1,000 mg by mouth 2 (two) times daily as needed for mild pain or moderate pain. )  . Albuterol Sulfate ([name]) 976 (90 Base) MCG/ACT AEPB Inhale 2 puffs into the lungs every 6 (six) hours as needed (dyspnea, wheezing).  [name] aspirin 81 MG tablet Take 1 tablet (81 mg total) by mouth daily.  . bisoprolol (ZEBETA) 5 MG tablet TAKE 1/2 TABLET BY MOUTH DAILY  . carbidopa-levodopa ([name]) 25-100 MG tablet Take by mouth. 2 in the morning, 1 in the afternoon, and 1 in the evening  . clonazePAM (KLONOPIN) 0.5 MG tablet Take 0.5 tablets (0.25 mg total) by mouth at bedtime.  .  Fluticasone-Umeclidin-Vilant (TRELEGY ELLIPTA) 100-62.5-25 MCG/INH AEPB Inhale 1 puff into the lungs daily.  [name] gabapentin ([name]) 300 MG capsule TAKE 2 CAPSULES (600 MG TOTAL) BY MOUTH 3 (THREE) TIMES DAILY  . omeprazole (PRILOSEC) 20 MG capsule Take 1 capsule (20 mg total) by mouth daily.  . Vitamin D, Ergocalciferol, ([name]) 1.25 MG (50000 UT) CAPS capsule Take 1 capsule (50,000 Units total) by mouth every 7 (seven) days.     Allergies:   Sulfonamide derivatives; Topamax [topiramate]; Biaxin [clarithromycin]; Hydrocodone; Motrin [ibuprofen]; Symbicort [budesonide-formoterol fumarate]; Percocet [oxycodone-acetaminophen]; Tape; and Tetracyclines & related   Social History   Tobacco Use  . Smoking status: Never Smoker  . Smokeless tobacco: Never Used  Substance Use Topics  . Alcohol use: No    Alcohol/week: 0.0 standard drinks  . Drug use: No     Family Hx: The patient's family history includes Breast cancer in her cousin; Breast cancer (age of onset: 49) in her sister; Cancer in her maternal grandfather, maternal grandmother, maternal uncle, maternal uncle, and maternal uncle; Colon cancer in her sister; Colon  cancer (age of onset: 68) in her cousin; Emphysema in her mother; Liver cancer in her maternal aunt; Lung cancer (age of onset: 70) in her cousin; Lung cancer (age of onset: 93) in her father; Melanoma in her maternal aunt; Myasthenia gravis in her paternal aunt; Osteoporosis in her paternal aunt; Stroke in her paternal aunt. There is no history of Diabetes, Heart disease, Stomach cancer, or Ulcerative colitis.  ROS:   Please see the history of present illness.     All other systems reviewed and are negative.   Labs/Other Tests and Data Reviewed:    Recent Labs: 10/25/2018: ALT 10 10/26/2018: TSH 1.893 12/23/2018: BUN 16; Creatinine, Ser 0.97; Hemoglobin 13.7; Platelets 167; Potassium 4.3; Sodium 141   Recent Lipid Panel Lab Results  Component Value Date/Time   CHOL  181 01/19/2019 11:09 AM   TRIG 115.0 01/19/2019 11:09 AM   HDL 57.40 01/19/2019 11:09 AM   CHOLHDL 3 01/19/2019 11:09 AM   LDLCALC 101 (H) 01/19/2019 11:09 AM   LDLDIRECT 135.0 08/16/2009 01:59 PM    Wt Readings from Last 3 Encounters:  02/26/19 137 lb (62.1 kg)  01/27/19 137 lb 8 oz (62.4 kg)  01/19/19 138 lb 4.8 oz (62.7 kg)     Objective:    Vital Signs:  BP (!) 170/100 (BP Location: Right Arm, Patient Position: Sitting, Cuff Size: Normal)   Pulse 64   Ht 5' 2.5" (1.588 m)   Wt 137 lb (62.1 kg)   BMI 24.66 kg/m     ASSESSMENT & PLAN:    1.  Mitral valve prolapse with moderate regurgitation: Continue to monitor clinically and repeat echo in 1 to 2 years.  2.  Exertional dyspnea: Likely due to underlying lung disease but she still requires ischemic cardiac evaluation especially with presence of coronary artery calcifications on previous CT imaging.  I am planning to do a Lexiscan Myoview in the next few months.  She reports improvement in dyspnea after she was started on trilogy.  3.  Neurogenic orthostatic hypotension in the setting of Parkinson's disease: Some improvement in her symptoms after decreasing antihypertensive medications.  Northera can be considered as a last resort but cost might be an issue.  4.  Previous small pulmonary embolus: She finished 3 months of anticoagulation.  D-dimer was normal.  COVID-19 Education: The signs and symptoms of COVID-19 were discussed with the patient and how to seek care for testing (follow up with PCP or arrange E-visit).  The importance of social distancing was discussed today.  Patient Risk:   After full review of this patient's clinical status, I feel that they are at least moderate risk at this time.  Time:   Today, I have spent 25 minutes with the patient with telehealth technology discussing above complex issues.     Medication Adjustments/Labs and Tests Ordered: Current medicines are reviewed at length with the patient  today.  Concerns regarding medicines are outlined above.  Tests Ordered: No orders of the defined types were placed in this encounter.  Medication Changes: No orders of the defined types were placed in this encounter.   Disposition:  Follow up in 3 month(s)  Signed, [name], MD  02/26/2019 10:43 AM    [place]

## 2019-02-27 ENCOUNTER — Telehealth: Payer: Self-pay

## 2019-02-27 NOTE — Telephone Encounter (Signed)
Pt left v/m; pt scheduled 03/03/19 for nurse visit for vit b 12 injection. Pt wants to know if OK to reschedule or not.

## 2019-03-01 NOTE — Telephone Encounter (Signed)
Ok to reschedule or ok to do curbside nurse visit - would offer either to patient. Thanks.

## 2019-03-03 ENCOUNTER — Other Ambulatory Visit: Payer: Self-pay

## 2019-03-03 ENCOUNTER — Ambulatory Visit (INDEPENDENT_AMBULATORY_CARE_PROVIDER_SITE_OTHER): Payer: Medicare Other

## 2019-03-03 DIAGNOSIS — E538 Deficiency of other specified B group vitamins: Secondary | ICD-10-CM

## 2019-03-03 MED ORDER — CYANOCOBALAMIN 1000 MCG/ML IJ SOLN
1000.0000 ug | Freq: Once | INTRAMUSCULAR | Status: AC
  Administered 2019-03-03: 15:00:00 1000 ug via INTRAMUSCULAR

## 2019-03-03 NOTE — Progress Notes (Signed)
Pt given B12 injection in Right Deltoid. Pt tolerated well.

## 2019-03-06 ENCOUNTER — Other Ambulatory Visit: Payer: Self-pay | Admitting: Neurology

## 2019-04-07 ENCOUNTER — Ambulatory Visit (INDEPENDENT_AMBULATORY_CARE_PROVIDER_SITE_OTHER): Payer: Medicare Other

## 2019-04-07 DIAGNOSIS — E538 Deficiency of other specified B group vitamins: Secondary | ICD-10-CM

## 2019-04-07 MED ORDER — CYANOCOBALAMIN 1000 MCG/ML IJ SOLN
1000.0000 ug | Freq: Once | INTRAMUSCULAR | Status: AC
  Administered 2019-04-07: 1000 ug via INTRAMUSCULAR

## 2019-04-07 NOTE — Progress Notes (Signed)
Per orders of Dr. [name], injection of Vitamin B12 given by Diamond Nickel, RN.  Patient tolerated injection well. Patient insists on injection to her Right deltoid.

## 2019-04-08 ENCOUNTER — Telehealth: Payer: Medicare Other | Admitting: Neurology

## 2019-04-16 ENCOUNTER — Ambulatory Visit: Payer: Medicare Other | Admitting: Internal Medicine

## 2019-04-16 ENCOUNTER — Ambulatory Visit: Payer: Medicare Other | Admitting: Neurology

## 2019-05-11 ENCOUNTER — Other Ambulatory Visit: Payer: Self-pay

## 2019-05-11 ENCOUNTER — Other Ambulatory Visit (INDEPENDENT_AMBULATORY_CARE_PROVIDER_SITE_OTHER): Payer: Medicare Other

## 2019-05-11 DIAGNOSIS — C561 Malignant neoplasm of right ovary: Secondary | ICD-10-CM

## 2019-05-12 ENCOUNTER — Ambulatory Visit (INDEPENDENT_AMBULATORY_CARE_PROVIDER_SITE_OTHER): Payer: Medicare Other

## 2019-05-12 DIAGNOSIS — E538 Deficiency of other specified B group vitamins: Secondary | ICD-10-CM | POA: Diagnosis not present

## 2019-05-12 MED ORDER — CYANOCOBALAMIN 1000 MCG/ML IJ SOLN
1000.0000 ug | Freq: Once | INTRAMUSCULAR | Status: AC
  Administered 2019-05-12: 1000 ug via INTRAMUSCULAR

## 2019-05-12 NOTE — Progress Notes (Signed)
Pt given #4 of 6 monthly B12 injections in Right Deltoid as requested by pt due to mastectomy on the left. Tolerated well.

## 2019-05-13 LAB — CA 125: Cancer Antigen (CA) 125: 25.8 U/mL (ref 0.0–38.1)

## 2019-06-08 ENCOUNTER — Other Ambulatory Visit: Payer: Self-pay

## 2019-06-16 ENCOUNTER — Ambulatory Visit (INDEPENDENT_AMBULATORY_CARE_PROVIDER_SITE_OTHER): Payer: Medicare Other | Admitting: *Deleted

## 2019-06-16 DIAGNOSIS — E538 Deficiency of other specified B group vitamins: Secondary | ICD-10-CM | POA: Diagnosis not present

## 2019-06-16 MED ORDER — CYANOCOBALAMIN 1000 MCG/ML IJ SOLN
1000.0000 ug | Freq: Once | INTRAMUSCULAR | Status: AC
  Administered 2019-06-16: 1000 ug via INTRAMUSCULAR

## 2019-06-16 NOTE — Progress Notes (Signed)
Per orders of Dr. Gutierrez, injection of B12 1000 mcg/mL given by [place] Sunday. °Patient tolerated injection well. ° °

## 2019-06-24 ENCOUNTER — Other Ambulatory Visit: Payer: Self-pay | Admitting: Neurology

## 2019-06-24 NOTE — Telephone Encounter (Signed)
Requested Prescriptions   Pending Prescriptions Disp Refills  . clonazePAM (KLONOPIN) 0.5 MG tablet [Pharmacy Med Name: CLONAZEPAM 0.5 MG TABLET] 45 tablet 1    Sig: TAKE 0.5 TABLETS (0.25 MG TOTAL) BY MOUTH AT BEDTIME.   Rx last filled: 12/29/18 #45 1 refills  Pt last seen: 12/05/18   Follow up appt scheduled: none

## 2019-06-25 NOTE — Telephone Encounter (Signed)
Provider approved 

## 2019-07-03 ENCOUNTER — Other Ambulatory Visit: Payer: Self-pay | Admitting: Neurology

## 2019-07-03 NOTE — Telephone Encounter (Signed)
Requested Prescriptions   Pending Prescriptions Disp Refills  . Carbidopa-Levodopa ER (SINEMET CR) 25-100 MG tablet controlled release [Pharmacy Med Name: CARBIDOPA-LEVO ER 25-100 TAB] 360 tablet 1    Sig: TAKE 2 TABS IN THE AM, 1 IN AFTERNOON, 1 IN THE EVENING   Rx last filled: 03/09/19 #360 1 refills  Pt last seen: 1/410/20   Follow up appt scheduled:none  Denied patient on CARBIDOPA-LEVODOPA IR 25/100 NOT CR See telephone note

## 2019-07-21 ENCOUNTER — Ambulatory Visit (INDEPENDENT_AMBULATORY_CARE_PROVIDER_SITE_OTHER): Payer: Medicare Other | Admitting: Family Medicine

## 2019-07-21 ENCOUNTER — Ambulatory Visit: Payer: Medicare Other

## 2019-07-21 ENCOUNTER — Ambulatory Visit (INDEPENDENT_AMBULATORY_CARE_PROVIDER_SITE_OTHER): Payer: Medicare Other

## 2019-07-21 ENCOUNTER — Encounter: Payer: Self-pay | Admitting: Family Medicine

## 2019-07-21 VITALS — BP 140/86 | HR 64 | Ht 62.5 in | Wt 136.0 lb

## 2019-07-21 DIAGNOSIS — E538 Deficiency of other specified B group vitamins: Secondary | ICD-10-CM

## 2019-07-21 DIAGNOSIS — J029 Acute pharyngitis, unspecified: Secondary | ICD-10-CM | POA: Insufficient documentation

## 2019-07-21 MED ORDER — CYANOCOBALAMIN 1000 MCG/ML IJ SOLN
1000.0000 ug | Freq: Once | INTRAMUSCULAR | Status: AC
  Administered 2019-07-21: 1000 ug via INTRAMUSCULAR

## 2019-07-21 NOTE — Progress Notes (Signed)
Pt received monthly B12 injection in Left Deltoid per patient's request.   Her labs stated to have 6 monthly injections and then test. Today was her 6th injection. Please advise if she needs labs prior to injection or if they can be done on the same day.

## 2019-07-21 NOTE — Progress Notes (Signed)
May be done on same day.

## 2019-07-21 NOTE — Progress Notes (Signed)
Virtual visit completed through Doxy.Me. Due to national recommendations of social distancing due to COVID-19, a virtual visit is felt to be most appropriate for this patient at this time. Reviewed limitations of a virtual visit.   Patient location: home Provider location: [place] [facility], office If any vitals were documented, they were collected by patient at home unless specified below.    BP 140/86   Pulse 64   Ht 5' 2.5" (1.588 m)   Wt 136 lb (61.7 kg)   BMI 24.48 kg/m    CC: ST Subjective:    Patient ID: Crystal Bass, female    DOB: Jan 05, 1941, 78 y.o.   MRN: [id number]  HPI: Crystal Bass is a 78 y.o. female presenting on 07/21/2019 for Sore Throat (C/o sore throat off and on a few weeks.  Thinks it may be allergy related. Denies fever. )   Known MVP with mod regurg, exertional dyspnea thought pulm related and parkinson's with neurogenic orthostatic hypotension. H/o small PE 10/2018, completed 3 months of anticoagulation. H/o COPD with asthma and [name] infection. Cardiac stress testing pending due to [place] pandemic.   Was supposed to have B12 shot - but did endorse on and off ST for last few weeks. Describes intermittent ST worse in afternoons from 4pm to bedtime. This is not happening every day. She hasn't had any ST in last few days.   Denies fevers/chills, cough, PNDrainage, HA, loss of taste, abd pain, nausea, diarrhea or body aches.  No dysphagia. No early satiety. No hoarse voice.  No GERD symptoms.   Ongoing chronic exertional dyspnea. Mild chronic slight cough productive of scant mucous - this has responded to prednisone in the past. Chronic loss of smell from known parkinson's disease.   No known sick contacts.  She does receive weekly allergy shots through Dr Donneta Romberg with partial benefit, as well as PRN antihistamine (allegra).      Relevant past medical, surgical, family and social history reviewed and updated as indicated. Interim medical history  since our last visit reviewed. Allergies and medications reviewed and updated. Outpatient Medications Prior to Visit  Medication Sig Dispense Refill  . acetaminophen (TYLENOL) 500 MG tablet Take 2 tablets (1,000 mg total) by mouth 2 (two) times daily as needed. (Patient taking differently: Take 1,000 mg by mouth 2 (two) times daily as needed for mild pain or moderate pain. )    . Albuterol Sulfate ([name]) 123XX123 (90 Base) MCG/ACT AEPB Inhale 2 puffs into the lungs every 6 (six) hours as needed (dyspnea, wheezing).    [name] aspirin 81 MG tablet Take 1 tablet (81 mg total) by mouth daily. 30 tablet   . bisoprolol (ZEBETA) 5 MG tablet TAKE 1/2 TABLET BY MOUTH DAILY 45 tablet 3  . carbidopa-levodopa ([name]) 25-100 MG tablet Take by mouth. 2 in the morning, 1 in the afternoon, and 1 in the evening    . Carbidopa-Levodopa ER (SINEMET CR) 25-100 MG tablet controlled release TAKE 2 TABS IN THE AM, 1 IN AFTERNOON, 1 IN THE EVENING 360 tablet 1  . clonazePAM (KLONOPIN) 0.5 MG tablet TAKE 0.5 TABLETS (0.25 MG TOTAL) BY MOUTH AT BEDTIME. 45 tablet 1  . Fluticasone-Umeclidin-Vilant (TRELEGY ELLIPTA) 100-62.5-25 MCG/INH AEPB Inhale 1 puff into the lungs daily.    [name] gabapentin ([name]) 300 MG capsule TAKE 2 CAPSULES (600 MG TOTAL) BY MOUTH 3 (THREE) TIMES DAILY 540 capsule 3  . omeprazole (PRILOSEC) 20 MG capsule Take 1 capsule (20 mg total) by  mouth daily. 30 capsule 11  . Vitamin D, Ergocalciferol, ([name]) 1.25 MG (50000 UT) CAPS capsule Take 1 capsule (50,000 Units total) by mouth every 7 (seven) days. 12 capsule 2   No facility-administered medications prior to visit.      Per HPI unless specifically indicated in ROS section below Review of Systems Objective:    BP 140/86   Pulse 64   Ht 5' 2.5" (1.588 m)   Wt 136 lb (61.7 kg)   BMI 24.48 kg/m   Wt Readings from Last 3 Encounters:  07/21/19 136 lb (61.7 kg)  02/26/19 137 lb (62.1 kg)  01/27/19 137 lb 8 oz (62.4 kg)     Physical  exam: Gen: alert, NAD, not ill appearing Pulm: speaks in complete sentences without increased work of breathing Psych: normal mood, normal thought content      Results for orders placed or performed in visit on 05/11/19  CA 125  Result Value Ref Range   Cancer Antigen (CA) 125 25.8 0.0 - 38.1 U/mL   Assessment & Plan:   Problem List Items Addressed This Visit    Sore throat - Primary    Not consistent with [id number]. Possibly allergy related. No red flags. Supportive care reviewed. Ok to come in for b12 shot. Update if new or worsening symptoms develop. Pt agrees with plan.           No orders of the defined types were placed in this encounter.  No orders of the defined types were placed in this encounter.   I discussed the assessment and treatment plan with the patient. The patient was provided an opportunity to ask questions and all were answered. The patient agreed with the plan and demonstrated an understanding of the instructions. The patient was advised to call back or seek an in-person evaluation if the symptoms worsen or if the condition fails to improve as anticipated.  Follow up plan: No follow-ups on file.  [name], MD

## 2019-07-21 NOTE — Assessment & Plan Note (Signed)
Not consistent with [id number]. Possibly allergy related. No red flags. Supportive care reviewed. Ok to come in for b12 shot. Update if new or worsening symptoms develop. Pt agrees with plan.

## 2019-08-04 ENCOUNTER — Telehealth: Payer: Self-pay | Admitting: Family Medicine

## 2019-08-04 ENCOUNTER — Ambulatory Visit: Payer: Medicare Other | Admitting: Family Medicine

## 2019-08-04 NOTE — Telephone Encounter (Signed)
Patient called in regards to her appointment that she was suppose to have today @ 2:00pm This was cancelled via automated system. She was very admit that this was just a lab visit not an appointment with her Dr I tried to reschedule the follow up visit for her and explain that this appt was scheduled for a follow up but she wants to see if you want her to just have labs   Patient stated that she is needing to recheck her B12

## 2019-08-05 NOTE — Telephone Encounter (Signed)
Yes I would like her to have labwork done but would want it done prior to next B12 shot (or 1 month after last shot). Then may offer virtual visit to go over lab results.

## 2019-08-06 NOTE — Telephone Encounter (Signed)
Spoke with pt relaying Dr. Synthia Innocent message.  Pt scheduled lab visit for 08/21/19 at 2:15 and virtual visit on 08/27/19 at 10:30.

## 2019-08-11 ENCOUNTER — Ambulatory Visit (INDEPENDENT_AMBULATORY_CARE_PROVIDER_SITE_OTHER): Payer: Medicare Other

## 2019-08-11 DIAGNOSIS — Z23 Encounter for immunization: Secondary | ICD-10-CM | POA: Diagnosis not present

## 2019-08-12 ENCOUNTER — Other Ambulatory Visit: Payer: Medicare Other

## 2019-08-14 ENCOUNTER — Other Ambulatory Visit: Payer: Self-pay | Admitting: Internal Medicine

## 2019-08-18 ENCOUNTER — Ambulatory Visit: Payer: Medicare Other | Admitting: Family Medicine

## 2019-08-21 ENCOUNTER — Other Ambulatory Visit: Payer: Medicare Other

## 2019-08-24 ENCOUNTER — Other Ambulatory Visit (INDEPENDENT_AMBULATORY_CARE_PROVIDER_SITE_OTHER): Payer: Medicare Other

## 2019-08-24 DIAGNOSIS — E538 Deficiency of other specified B group vitamins: Secondary | ICD-10-CM

## 2019-08-24 LAB — VITAMIN B12: Vitamin B-12: 279 pg/mL (ref 211–911)

## 2019-08-26 ENCOUNTER — Telehealth: Payer: Self-pay | Admitting: *Deleted

## 2019-08-26 ENCOUNTER — Telehealth: Payer: Self-pay | Admitting: Cardiovascular Disease

## 2019-08-26 DIAGNOSIS — R55 Syncope and collapse: Secondary | ICD-10-CM

## 2019-08-26 DIAGNOSIS — G903 Multi-system degeneration of the autonomic nervous system: Secondary | ICD-10-CM

## 2019-08-26 NOTE — Telephone Encounter (Signed)
Patient notified as instructed by telephone and verbalized understanding. Patient stated that she is not using compression stockings and no abdominal binder. Patient stated that she has not tried any medication to raise her blood pressure because it goes up when she is off of the blood pressure medication. Patient stated that she is going to reach out to Dr. Fletcher Anon about her ongoing symptoms because she is concerned that her blood pressure will go up being off of the medication. Patient stated that she is going to take tylenol for her back pain now. Patient stated that she does not have a headache. Patient stated that she knows that her back is going to be sore for a few days and will call the office if it does not improve. ER precautions given to patient regarding headache and symptoms and she verbalized understanding.

## 2019-08-26 NOTE — Telephone Encounter (Signed)
The patient is scheduled for a Video Visit with Dr. Fletcher Anon on 10/9 at 8:00 am.      Virtual Visit Pre-Appointment Phone Call  "(Name), I am calling you today to discuss your upcoming appointment. We are currently trying to limit exposure to the virus that causes COVID-19 by seeing patients at home rather than in the office."  1. "What is the BEST phone number to call the day of the visit?" - include this in appointment notes  2. Do you have or have access to (through a family member/friend) a smartphone with video capability that we can use for your visit?" a. If yes - list this number in appt notes as cell (if different from BEST phone #) and list the appointment type as a VIDEO visit in appointment notes b. If no - list the appointment type as a PHONE visit in appointment notes  3. Confirm consent - "In the setting of the current Covid19 crisis, you are scheduled for a (phone or video) visit with your provider on (date) at (time).  Just as we do with many in-office visits, in order for you to participate in this visit, we must obtain consent.  If you'd like, I can send this to your mychart (if signed up) or email for you to review.  Otherwise, I can obtain your verbal consent now.  All virtual visits are billed to your insurance company just like a normal visit would be.  By agreeing to a virtual visit, we'd like you to understand that the technology does not allow for your provider to perform an examination, and thus may limit your provider's ability to fully assess your condition. If your provider identifies any concerns that need to be evaluated in person, we will make arrangements to do so.  Finally, though the technology is pretty good, we cannot assure that it will always work on either your or our end, and in the setting of a video visit, we may have to convert it to a phone-only visit.  In either situation, we cannot ensure that we have a secure connection.  Are you willing to proceed?"  STAFF: Did the patient verbally acknowledge consent to telehealth visit? Document YES/NO here: Yes  4. Advise patient to be prepared - "Two hours prior to your appointment, go ahead and check your blood pressure, pulse, oxygen saturation, and your weight (if you have the equipment to check those) and write them all down. When your visit starts, your provider will ask you for this information. If you have an Apple Watch or Kardia device, please plan to have heart rate information ready on the day of your appointment. Please have a pen and paper handy nearby the day of the visit as well."  5. Give patient instructions for MyChart download to smartphone OR [email or web address] as below if video visit (depending on what platform provider is using)  6. Inform patient they will receive a phone call 15 minutes prior to their appointment time (may be from unknown caller ID) so they should be prepared to answer    TELEPHONE CALL NOTE  KAYCIA GARMON has been deemed a candidate for a follow-up tele-health visit to limit community exposure during the Covid-19 pandemic. I spoke with the patient via phone to ensure availability of phone/video source, confirm preferred email & phone number, and discuss instructions and expectations.  I reminded NANCYANN PADMORE to be prepared with any vital sign and/or heart rhythm information that could potentially be obtained via home  monitoring, at the time of her visit. I reminded CATHELENE BILBAO to expect a phone call prior to her visit.  Alvis Lemmings, RN 08/26/2019 4:58 PM

## 2019-08-26 NOTE — Telephone Encounter (Signed)
I spoke with the patient. She has known orthostatic hypotension in the setting of Parkinson's disease.  She states that over the last 6-8 weeks, she has had 3 episodes where she has passed out. Today was her most recent episode. She states she had bowel movement this morning and felt a little dizzy after.  She did not pass out right away. She walked into the kitchen after the leaving the bathroom and could tell she was feeling weak. She states she woke up on the floor along with items from the kitchen counter.  She states her back is sore and she has lump on her head per her neighbor who is a retired Therapist, music.  She is currently on ASA only.   She took orthostatic BP readings ~ 2 hours after her event. Sitting- 110/59 & Standing- 69/46  She took these again ~ an hour ago. Sitting- 116/? & Standing- 87/59.   She does not currently wear support hose/ socks or abdominal binder. She thinks her fluid intake may be down a little bit.  No other recent medication changes.   Per Dr. Tyrell Antonio last e-visit note from 02/26/19: 3.  Neurogenic orthostatic hypotension in the setting of Parkinson's disease: Some improvement in her symptoms after decreasing antihypertensive medications.  Northera can be considered as a last resort but cost might be an issue.  I have advised her I will forward to Dr. Fletcher Anon to review/ for further recommendations. I have instructed her if she develops a headache/ dizziness/ vision disturbance/ nausea to report to the ER for a possible scan of her head. She voices understanding and is agreeable.

## 2019-08-26 NOTE — Telephone Encounter (Signed)
Pt c/o BP issue: STAT if pt c/o blurred vision, one-sided weakness or slurred speech  1. What are your last 5 BP readings? Today: Sitting 107/69 standing 69/46  2. Are you having any other symptoms (ex. Dizziness, headache, blurred vision, passed out)? Passes out, dizziness  3. What is your BP issue? BP has been low and patient has passed out a couple times

## 2019-08-26 NOTE — Telephone Encounter (Signed)
I called and spoke with the patient. I advised her of Dr. Tyrell Antonio recommendations to: 1) obtain a BMP/ CBC 2) wear thigh-high support socks & abdominal binder 3) have a follow up appt to discuss her medications  The patient is agreeable with the above recommendations.  She lives at [facility]- I advised her I will send her lab orders there and have them reach out to her to schedule a draw. She is also agreeable with an e-visit with Dr. Fletcher Anon on 09/04/19 at 8:00 am.  She will try to obtain support socks/ abdominal binder prior to her appointment as well. I have advised her to be very cautious when changing position. She voices understanding and is agreeable.

## 2019-08-26 NOTE — Telephone Encounter (Signed)
Please stop bisoprolol.  Increase water intake.  Is she using compression stockings, abdominal binder? At last visit with Dr Fletcher Anon had discussed northera as option if ongoing orthostasis (medicine to actually raise blood pressure) - did she ever try this? May need to touch base with Dr Fletcher Anon if ongoing symptoms.  If back pain, HA, would offer in office evaluation.

## 2019-08-26 NOTE — Telephone Encounter (Signed)
Patient called stating that she passed out today which was the third time that she has ended up in the floor in the last 2 months. Patient stated that she has felt lightheaded several times, but did not hit the floor.  Patient stated that this seems to happen after having a bowel movement and the stool is usually loose. Patient stated that she was standing at the kitchen sink and the next thing she knew she was on the floor. Patient stated that she hit her head and has a sore spot on it. Patient stated that she must have hit her back because it hurts also. Patient stated that it is painful to sit and more when she pushes on the  door. Patient stated that about 2 hours after the fall her blood pressure was 106/70 sitting and 70/46 standing.

## 2019-08-26 NOTE — Telephone Encounter (Signed)
Recommend checking CBC and basic metabolic profile.  Agree with thigh-high support socks and abdominal binders to be used during the day.  We should schedule her a follow-up appointment as well to discuss medications.

## 2019-08-27 ENCOUNTER — Ambulatory Visit: Payer: Medicare Other | Admitting: Family Medicine

## 2019-08-27 NOTE — Telephone Encounter (Signed)
BMP/ CBC orders faxed to [facility] at [phone]. Confirmation received.

## 2019-08-31 ENCOUNTER — Encounter: Payer: Self-pay | Admitting: Cardiovascular Disease

## 2019-09-03 ENCOUNTER — Ambulatory Visit (INDEPENDENT_AMBULATORY_CARE_PROVIDER_SITE_OTHER): Payer: Medicare Other

## 2019-09-03 DIAGNOSIS — E538 Deficiency of other specified B group vitamins: Secondary | ICD-10-CM

## 2019-09-03 MED ORDER — CYANOCOBALAMIN 1000 MCG/ML IJ SOLN
1000.0000 ug | Freq: Once | INTRAMUSCULAR | Status: AC
  Administered 2019-09-03: 1000 ug via INTRAMUSCULAR

## 2019-09-03 NOTE — Progress Notes (Signed)
Per orders of Dr. Gutierrez, injection of vit B12 given by Kaavya Puskarich. Patient tolerated injection well.  

## 2019-09-04 ENCOUNTER — Telehealth (INDEPENDENT_AMBULATORY_CARE_PROVIDER_SITE_OTHER): Payer: Medicare Other | Admitting: Cardiovascular Disease

## 2019-09-04 ENCOUNTER — Other Ambulatory Visit: Payer: Self-pay

## 2019-09-04 ENCOUNTER — Encounter: Payer: Self-pay | Admitting: Cardiovascular Disease

## 2019-09-04 ENCOUNTER — Telehealth: Payer: Self-pay

## 2019-09-04 VITALS — BP 152/94 | HR 63 | Ht 62.0 in | Wt 134.3 lb

## 2019-09-04 DIAGNOSIS — I059 Rheumatic mitral valve disease, unspecified: Secondary | ICD-10-CM

## 2019-09-04 DIAGNOSIS — G903 Multi-system degeneration of the autonomic nervous system: Secondary | ICD-10-CM

## 2019-09-04 MED ORDER — NORTHERA 100 MG PO CAPS: 100.0000 mg | ORAL_CAPSULE | Freq: Three times a day (TID) | ORAL | 1 refills | Status: DC

## 2019-09-04 NOTE — Progress Notes (Signed)
Virtual Visit via Video Note    Evaluation Performed:  Follow-up visit  This visit type was conducted due to national recommendations for restrictions regarding the COVID-19 Pandemic (e.g. social distancing).  This format is felt to be most appropriate for this patient at this time.  All issues noted in this document were discussed and addressed.  No physical exam was performed (except for noted visual exam findings with Video Visits).  Please refer to the patient's chart (MyChart message for video visits and phone note for telephone visits) for the patient's consent to telehealth for [facility].  Date:  09/04/2019   ID:  Crystal Bass, DOB 1941/05/12, MRN [id number]  Patient Location:    Provider location:   [place]  PCP:  [name], MD  Cardiologist:  [name], MD  Electrophysiologist:  None   Chief Complaint: Follow-up visit  History of Present Illness:    Crystal Bass is a 78 y.o. female who was reached via video visit for follow-up regarding severe orthostatic hypotension.   She has chronic medical conditions that include mitral valve prolapse with moderate regurgitation, exertional dyspnea and orthostatic hypotension in the setting of Parkinson's disease. She was diagnosed with Parkinson's in 2015 but reports prolonged history of orthostatic dizziness. She used to be on multiple blood pressure medications which were discontinued due to orthostatic hypotension.  She has known history of COPD.  The patient had prior cardiac catheterization in 2012 by Dr. [name] which showed normal coronary arteries and ejection fraction and normal renal arteries. There has been concern about decreasing her antihypertensive medications given the presence of an ascending aortic aneurysm at 4 cm.  Fortunately, this has been stable on serial imaging.   Previous CT of the lungs showed evidence of coronary artery calcifications. Echocardiogram in October 2019 showed  normal LV systolic function, grade 1 diastolic dysfunction, anterior mitral valve leaflet prolapse with moderate to severe regurgitation.  Left atrium was moderately to severely dilated.    She was hospitalized in early December, 2019 with atypical chest pain.  Initial CTA of the chest showed single small pulmonary embolism in the apical segmental branch of the left upper lobe.  There was also evidence of bronchiectasis.  Repeat CT after 2 days showed resolution of the pulmonary embolus.  The patient was treated with Eliquis for 3 months.    She continued to have exertional dyspnea and there was concern about the degree of mitral regurgitation.  I proceeded with a transesophageal echocardiogram in February which showed that her mitral regurgitation was moderate.  Ejection fraction was normal.  She had worsening orthostatic hypotension recently with 3 episodes of syncope with significant drop in blood pressure.  I requested routine labs on her which were done recently and showed normal CBC and basic metabolic profile.  Her supine blood pressure continues to be elevated.  The patient does not have symptoms concerning for COVID-19 infection (fever, chills, cough, or new shortness of breath).    Prior CV studies:   The following studies were reviewed today:    Past Medical History:  Diagnosis Date  . 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 Aortic root aneurysm ([place])   . Arthritis   . Asthma   . Breast cancer ([place]) 2002   infiltrative ductal carcinoma   . Cataract 2019  . COPD (chronic obstructive pulmonary disease) ([place]) 9/08  . Ductal carcinoma of breast, estrogen receptor positive, stage 1 (  [place]) 09/16/2012  . Endometriosis   . GERD (gastroesophageal reflux disease)   . Hypercalcemia 09/14/2013  . Hypertension   . Lesion of breast 1992   right, benign  . Lung disease    secondary to [name] infection  . Mastalgia 7/95  . Osteoporosis, post-menopausal  03/14/2012  . Ovarian cancer ([place]) 2004  . Pancreatitis    secondary to Cholelithiasis  . Parkinson disease ([place]) 02/10/15  . Post-thoracotomy pain syndrome   . Pseudomonas pneumonia ([place]) 5/09  . Uterine fibroid   . Vitamin D deficiency    Past Surgical History:  Procedure Laterality Date  . 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 APPENDECTOMY  2004  . BREAST BIOPSY  08/2004   right breast-benign  . BREAST LUMPECTOMY  1992   benign  . CARDIAC CATHETERIZATION  2007   essentially negation for significant CAD  . CHOLECYSTECTOMY  2001  . COLONOSCOPY  2014   Dr [name] , due 2019  . COLONOSCOPY  09/2018   2 TA removed, int hem, no f/u needed (Dr [name])   . ESOPHAGOGASTRODUODENOSCOPY  09/2018   GERD with esophagitis and stricture dilated, antral gastritis negative for H pylori [name])  . LUNG BIOPSY  2002   [name], Dr [name]  . MASTECTOMY MODIFIED RADICAL Left 2002   oral chemotheraphy (tamoxifen then Armidex) no radiation, Dr.[name]  . TEE WITHOUT CARDIOVERSION N/A 12/29/2018   Procedure: TRANSESOPHAGEAL ECHOCARDIOGRAM (TEE);  Surgeon: Wellington Hampshire, MD;  Location: [facility] ORS;  Service: Cardiovascular;  Laterality: N/A;  . TONSILLECTOMY  1946     Current Meds  Medication Sig  . acetaminophen (TYLENOL) 500 MG tablet Take 2 tablets (1,000 mg total) by mouth 2 (two) times daily as needed. (Patient taking differently: Take 1,000 mg by mouth 2 (two) times daily as needed for mild pain or moderate pain. )  . Albuterol Sulfate ([name]) 123XX123 (90 Base) MCG/ACT AEPB Inhale 2 puffs into the lungs every 6 (six) hours as needed (dyspnea, wheezing).  [name] aspirin 81 MG tablet Take 1 tablet (81 mg total) by mouth daily.  . bisoprolol (ZEBETA) 5 MG tablet TAKE 1/2 TABLET BY MOUTH DAILY  . carbidopa-levodopa ([name]) 25-100 MG tablet Take by mouth. 2 in the morning, 1 in the afternoon, and 1 in the evening  . Carbidopa-Levodopa ER (SINEMET  CR) 25-100 MG tablet controlled release TAKE 2 TABS IN THE AM, 1 IN AFTERNOON, 1 IN THE EVENING  . clonazePAM (KLONOPIN) 0.5 MG tablet TAKE 0.5 TABLETS (0.25 MG TOTAL) BY MOUTH AT BEDTIME.  [name] gabapentin ([name]) 300 MG capsule TAKE 2 CAPSULES (600 MG TOTAL) BY MOUTH 3 (THREE) TIMES DAILY  . omeprazole (PRILOSEC) 20 MG capsule Take 1 capsule (20 mg total) by mouth daily.  . TRELEGY ELLIPTA 100-62.5-25 MCG/INH AEPB TAKE 1 PUFF BY MOUTH EVERY DAY  . Vitamin D, Ergocalciferol, ([name]) 1.25 MG (50000 UT) CAPS capsule Take 1 capsule (50,000 Units total) by mouth every 7 (seven) days.     Allergies:   Sulfonamide derivatives, Topamax [topiramate], Biaxin [clarithromycin], Hydrocodone, Motrin [ibuprofen], Symbicort [budesonide-formoterol fumarate], Percocet [oxycodone-acetaminophen], Tape, and Tetracyclines & related   Social History   Tobacco Use  . Smoking status: Never Smoker  . Smokeless tobacco: Never Used  Substance Use Topics  . Alcohol use: No    Alcohol/week: 0.0 standard drinks  . Drug use: No     Family Hx: The patient's family history includes Breast cancer  in her cousin; Breast cancer (age of onset: 9) in her sister; Cancer in her maternal grandfather, maternal grandmother, maternal uncle, maternal uncle, and maternal uncle; Colon cancer in her sister; Colon cancer (age of onset: 74) in her cousin; Emphysema in her mother; Liver cancer in her maternal aunt; Lung cancer (age of onset: 6) in her cousin; Lung cancer (age of onset: 51) in her father; Melanoma in her maternal aunt; Myasthenia gravis in her paternal aunt; Osteoporosis in her paternal aunt; Stroke in her paternal aunt. There is no history of Diabetes, Heart disease, Stomach cancer, or Ulcerative colitis.  ROS:   Please see the history of present illness.     All other systems reviewed and are negative.   Labs/Other Tests and Data Reviewed:    Recent Labs: 10/25/2018: ALT 10 10/26/2018: TSH 1.893 12/23/2018: BUN  16; Creatinine, Ser 0.97; Hemoglobin 13.7; Platelets 167; Potassium 4.3; Sodium 141   Recent Lipid Panel Lab Results  Component Value Date/Time   CHOL 181 01/19/2019 11:09 AM   TRIG 115.0 01/19/2019 11:09 AM   HDL 57.40 01/19/2019 11:09 AM   CHOLHDL 3 01/19/2019 11:09 AM   LDLCALC 101 (H) 01/19/2019 11:09 AM   LDLDIRECT 135.0 08/16/2009 01:59 PM    Wt Readings from Last 3 Encounters:  09/04/19 134 lb 5 oz (60.9 kg)  07/21/19 136 lb (61.7 kg)  02/26/19 137 lb (62.1 kg)     Objective:    Vital Signs:  BP (!) 152/94   Pulse 63   Ht 5\' 2"  (1.575 m)   Wt 134 lb 5 oz (60.9 kg)   BMI 24.57 kg/m     ASSESSMENT & PLAN:    1.   Neurogenic orthostatic hypotension in the setting of Parkinson's disease: She is having progression of symptoms with 3 recent syncopal episodes and significant drop in blood pressure.  Her supine blood pressure today is 152/94 which limits the ability to use midodrine or Florinef.  Recent labs were unremarkable.  I we have suggested thigh-high support stockings but she is not able to place them given that she is very weak.  I also suggested abdominal binders but she has significant pain in the area due to neuropathy and this is also not practical. I think the best option is to try to place her on Northera and this was discussed with her.  Will initiate this medication per protocol pending insurance approval.  2.  Mitral valve prolapse with moderate regurgitation: Continue to monitor clinically and repeat echo in 1 to 2 years.  3.  Exertional dyspnea: Likely due to underlying lung disease .  Ischemic cardiac evaluation can be considered once we address the above issues.     4.  Previous small pulmonary embolus: She finished 3 months of anticoagulation.    COVID-19 Education: The signs and symptoms of COVID-19 were discussed with the patient and how to seek care for testing (follow up with PCP or arrange E-visit).  The importance of social distancing was discussed  today.  Patient Risk:   After full review of this patient's clinical status, I feel that they are at least moderate risk at this time.  Time:   Today, I have spent 12 minutes with the patient with telehealth technology discussing above complex issues.     Medication Adjustments/Labs and Tests Ordered: Current medicines are reviewed at length with the patient today.  Concerns regarding medicines are outlined above.  Tests Ordered: No orders of the defined types were placed in this  encounter.  Medication Changes: No orders of the defined types were placed in this encounter.   Disposition:  Follow up in 3 month(s)  Signed, [name], MD  09/04/2019 10:01 AM    [place]

## 2019-09-04 NOTE — Telephone Encounter (Signed)
Request Reference Number: [id number]. [place] CAP 100MG  is approved through 10/05/2019. For further questions, call [phone].

## 2019-09-04 NOTE — Patient Instructions (Signed)
Medication Instructions:  Your physician has recommended you make the following change in your medication:   START Northera 100mg  three times daily (morning, midday, evening). An Rx has been sent to your pharmacy. TIf you need a refill on your cardiac medications before your next appointment, please call your pharmacy.   Lab work: None ordered If you have labs (blood work) drawn today and your tests are completely normal, you will receive your results only by: [name] MyChart Message (if you have MyChart) OR . A paper copy in the mail If you have any lab test that is abnormal or we need to change your treatment, we will call you to review the results.  Testing/Procedures: None ordered  Follow-Up: At [facility], you and your health needs are our priority.  As part of our continuing mission to provide you with exceptional heart care, we have created designated Provider Care Teams.  These Care Teams include your primary Cardiologist (physician) and Advanced Practice Providers (APPs -  Physician Assistants and Nurse Practitioners) who all work together to provide you with the care you need, when you need it. You will need a follow up appointment in 3 months.  Please call our office 2 months in advance to schedule this appointment.  You may see [name], MD or one of the following Advanced Practice Providers on your designated Care Team:   Murray Hodgkins, NP Christell Faith, PA-C . Marrianne Mood, PA-C  Any Other Special Instructions Will Be Listed Below (If Applicable). N/A

## 2019-09-04 NOTE — Telephone Encounter (Addendum)
PA for Northera completed on covermymeds and sent to the patients insurance plan. PA decision pending.

## 2019-09-14 NOTE — Progress Notes (Signed)
Virtual Visit via Video Note The purpose of this virtual visit is to provide medical care while limiting exposure to the novel coronavirus.    Consent was obtained for video visit:  Yes.   Answered questions that patient had about telehealth interaction:  Yes.   I discussed the limitations, risks, security and privacy concerns of performing an evaluation and management service by telemedicine. I also discussed with the patient that there may be a patient responsible charge related to this service. The patient expressed understanding and agreed to proceed.  Pt location: Home Physician Location: home Name of referring provider:  [name], MD I connected with Patsey Berthold at patients initiation/request on 09/16/2019 at 10:45 AM EDT by video enabled telemedicine application and verified that I am speaking with the correct person using two identifiers. Pt MRN:  [id number] Pt DOB:  [birth year] Video Participants:  Patsey Berthold;     History of Present Illness:  Patient seen today in follow-up for Parkinson's disease.  I have not seen her since last January.  Patient is on carbidopa/levodopa 25/100 CR, 2 tablets in the morning, 1 in the afternoon and 1 in the evening.  We changed to the extended release last visit because of daytime hypersomnolence.  She reports that there was no changed in EDS with changing the medication - "I could sleep 20 hours per day."  She had 2 falls since I last saw the patient.  The first was 3 months ago.  She got lightheaded and fell after getting up.  She didn't get hurt.  The next one was 3 weeks ago.  She was dizzy and "I can tell when my BP is dropping so I held on the sink and the next thing that I knew I woke up on the floor."  She talked to cardiology about that and was RX northera.  She has not yet started it yet.  The records that were made available to me were reviewed.  She is still on clonazepam 0.5 mg, half tab at night for REM behavior disorder.   Patient reports that this is working well.   Prior medications: Carbidopa/levodopa 25/100 IR   Current Outpatient Medications on File Prior to Visit  Medication Sig Dispense Refill   acetaminophen (TYLENOL) 500 MG tablet Take 2 tablets (1,000 mg total) by mouth 2 (two) times daily as needed. (Patient taking differently: Take 1,000 mg by mouth 2 (two) times daily as needed for mild pain or moderate pain. )     Albuterol Sulfate ([name]) 123XX123 (90 Base) MCG/ACT AEPB Inhale 2 puffs into the lungs every 6 (six) hours as needed (dyspnea, wheezing).     aspirin 81 MG tablet Take 1 tablet (81 mg total) by mouth daily. 30 tablet    bisoprolol (ZEBETA) 5 MG tablet TAKE 1/2 TABLET BY MOUTH DAILY 45 tablet 3   Carbidopa-Levodopa ER (SINEMET CR) 25-100 MG tablet controlled release TAKE 2 TABS IN THE AM, 1 IN AFTERNOON, 1 IN THE EVENING 360 tablet 1   clonazePAM (KLONOPIN) 0.5 MG tablet TAKE 0.5 TABLETS (0.25 MG TOTAL) BY MOUTH AT BEDTIME. 45 tablet 1   Droxidopa (NORTHERA) 100 MG CAPS Take 100 mg by mouth 3 (three) times daily. 270 capsule 1   gabapentin ([name]) 300 MG capsule TAKE 2 CAPSULES (600 MG TOTAL) BY MOUTH 3 (THREE) TIMES DAILY 540 capsule 3   omeprazole (PRILOSEC) 20 MG capsule Take 1 capsule (20 mg total) by mouth daily. 30 capsule 11  TRELEGY ELLIPTA 100-62.5-25 MCG/INH AEPB TAKE 1 PUFF BY MOUTH EVERY DAY 60 each 1   Vitamin D, Ergocalciferol, ([name]) 1.25 MG (50000 UT) CAPS capsule Take 1 capsule (50,000 Units total) by mouth every 7 (seven) days. 12 capsule 2   No current facility-administered medications on file prior to visit.      Observations/Objective:   Vitals:   09/16/19 0928  Weight: 136 lb (61.7 kg)  Height: 5' 2.5" (1.588 m)   No data found.    GEN:  The patient appears stated age and is in NAD.  Neurological examination:  Orientation: The patient is alert and oriented x3. Cranial nerves: There is good facial symmetry. There is no  significant facial hypomimia.  The speech is fluent and clear. Soft palate rises symmetrically and there is no tongue deviation. Hearing is intact to conversational tone. Motor: Strength is at least antigravity x 4.   Shoulder shrug is equal and symmetric.  There is no pronator drift.  Movement examination: Tone: unable Abnormal movements: none Coordination:  There is no decremation with RAM's, with any form of RAMS, including alternating supination and pronation of the forearm, hand opening and closing, finger taps Gait and Station:  The patient's stride length is good.      Chemistry      Component Value Date/Time   NA 141 12/23/2018 1514   NA 141 03/28/2017 1130   K 4.3 12/23/2018 1514   K 4.2 03/28/2017 1130   CL 102 12/23/2018 1514   CL 102 03/10/2013 1529   CO2 22 12/23/2018 1514   CO2 28 03/28/2017 1130   BUN 16 12/23/2018 1514   BUN 17.3 03/28/2017 1130   CREATININE 0.97 12/23/2018 1514   CREATININE 0.8 03/28/2017 1130      Component Value Date/Time   CALCIUM 9.7 12/23/2018 1514   CALCIUM 9.6 03/28/2017 1130   ALKPHOS 83 10/25/2018 2225   ALKPHOS 85 03/28/2017 1130   AST 18 10/25/2018 2225   AST 12 03/28/2017 1130   ALT 10 10/25/2018 2225   ALT <6 03/28/2017 1130   BILITOT 1.2 10/25/2018 2225   BILITOT 1.38 (H) 03/28/2017 1130     Lab Results  Component Value Date   WBC 5.9 12/23/2018   HGB 13.7 12/23/2018   HCT 40.5 12/23/2018   MCV 90 12/23/2018   PLT 167 12/23/2018      Assessment and Plan:   1.   idiopathic Parkinson's disease.  The patient has tremor, bradykinesia, rigidity and mild postural instability.  Sx's have been present for at least since March, 2015 by the patients account .               -Continue carbidopa/levodopa 25/100 CR, 2 tabs in the morning, 1 in the afternoon and 1 in the evening.  I am not sure that the change helped her daytime hypersomnolence, but did not change her back to the immediate release, primarily because she is having  fairly significant orthostasis.  -Overall, patient does not think she is doing as well.  Offered her physical therapy.  She stated she may need that, but would rather have an in person visit sooner.  I will arrange that. 2.  EDS             -offered selegeline but she wants to hold on that.  Will let me know if changes her mind (she was worried about SE)             -wonder if related to  gabapentin 600 mg tid for neuropathic pain but she is resistant to changing that.  Will call her in 2 weeks to see how doing with new formulation of levodopa 3.  Long hx migraine             -No longer a candidate for triptan therapy.  This is due to cerebral small vessel disease.  She does have Cambia for when necessary use.  Headaches have been better lately 4.  Orthostatic hypotension, with recent syncopal episode             -still has dizziness but won't wear compression binder.  Encouraged to do so  -Issue is complex, because blood pressure is actually fairly high in the morning (diastolic over 123XX123 this morning) but then after she took her morning medications (including her blood pressure medication, bisoprolol and levodopa), blood pressure dropped to 80/53.  Just saw cardiology on September 04, 2019.  Cardiology started her on Northera, fairly low dose, 100 mg 3 times per day.  She has not yet started this, primarily because of cost.  Was going to cost her about $70 per month.  She does not know if she is going to be able to afford that, but is contemplating it.  If not, I told her to call cardiology back as I think that there are probably other options.  I asked her if cardiology thought she really needed the bisoprolol, but she stated that she thought she needed it for control of the morning hypertension because of her abdominal aortic aneurysm certainly may be the case.  -I did discuss with her the risk of supine hypertension, even with Northera, and talk to her about raising the head of the bed or using a wedge  pillow. 6.  Neck pain/hyperreflexia             -MRI cervical spine in April, 2016 just demonstrated mild multilevel cervical spondylosis. 7.  REM behavior disorder             -Doing well with low-dose clonazepam, 0.5 mg, half tablet at night.  This was refilled today. Risks, benefits, side effects and alternative therapies were discussed.  The opportunity to ask questions was given and they were answered to the best of my ability.  The patient expressed understanding and willingness to follow the outlined treatment protocols. 8.  Mild cognitive impairment             -She saw Dr. Si Raider and had neurocognitive testing on August 15, 2017.  She has reviewed the results with Dr. Si Raider.  There is no evidence of cognitive impairment, including dementia.  There is no evidence of a psychiatric disorder.  Follow Up Instructions:  will see her in 1-2 weeks for in person appt at her request.     -I discussed the assessment and treatment plan with the patient. The patient was provided an opportunity to ask questions and all were answered. The patient agreed with the plan and demonstrated an understanding of the instructions.   The patient was advised to call back or seek an in-person evaluation if the symptoms worsen or if the condition fails to improve as anticipated.    Total Time spent in visit with the patient was:  25 min, of which more than 50% of the time was spent in counseling and/or coordinating care on safety.   Pt understands and agrees with the plan of care outlined.     Crystal Bogus, DO

## 2019-09-16 ENCOUNTER — Telehealth (INDEPENDENT_AMBULATORY_CARE_PROVIDER_SITE_OTHER): Payer: Medicare Other | Admitting: Neurology

## 2019-09-16 ENCOUNTER — Encounter: Payer: Self-pay | Admitting: Neurology

## 2019-09-16 ENCOUNTER — Other Ambulatory Visit: Payer: Self-pay

## 2019-09-16 VITALS — Ht 62.5 in | Wt 136.0 lb

## 2019-09-16 DIAGNOSIS — G903 Multi-system degeneration of the autonomic nervous system: Secondary | ICD-10-CM | POA: Diagnosis not present

## 2019-09-16 DIAGNOSIS — R55 Syncope and collapse: Secondary | ICD-10-CM | POA: Diagnosis not present

## 2019-09-16 DIAGNOSIS — G20 Parkinson's disease: Secondary | ICD-10-CM | POA: Diagnosis not present

## 2019-10-01 NOTE — Progress Notes (Signed)
Crystal Bass was seen today in follow up for Parkinsons disease.  I just saw her on telemedicine a few weeks ago, but she wanted to see me in person, which is the reason for today's visit.  Pt denies falls.  Pt has dizziness and orthostatic hypotension.  Cardiology has prescribed Northera, but hasn't taken it yet.  Biggest c/o is the lack of energy.    No hallucinations.  Mood has been fair.  REM behavior disorder has been well controlled with clonazepam.  Current prescribed movement disorder medications: Carbidopa/levodopa 25/100 CR, 2 tablets in the morning, 1 in the afternoon and 1 in the evening Clonazepam 0.5 mg, half tablet at night   PREVIOUS MEDICATIONS: Sinemet (changed from IR due to EDS but not sure it really made a difference)  ALLERGIES:   Allergies  Allergen Reactions  . Sulfonamide Derivatives Anaphylaxis  . Topamax [Topiramate] Other (See Comments)    Metabolic acidosis   . Biaxin [Clarithromycin] Other (See Comments)    pericarditis  . Hydrocodone Other (See Comments)    "hyper and climbing the walls"  . Motrin [Ibuprofen] Other (See Comments)    headaches  . Symbicort [Budesonide-Formoterol Fumarate] Other (See Comments)    02/07/15 tremor  . Percocet [Oxycodone-Acetaminophen] Itching and Rash  . Tape Itching and Rash    Use paper tape only  . Tetracyclines & Related Other (See Comments)    "immediate yeast infection"    CURRENT MEDICATIONS:  Outpatient Encounter Medications as of 10/02/2019  Medication Sig  . acetaminophen (TYLENOL) 500 MG tablet Take 2 tablets (1,000 mg total) by mouth 2 (two) times daily as needed. (Patient taking differently: Take 1,000 mg by mouth 2 (two) times daily as needed for mild pain or moderate pain. )  . Albuterol Sulfate ([name]) 123XX123 (90 Base) MCG/ACT AEPB Inhale 2 puffs into the lungs every 6 (six) hours as needed (dyspnea, wheezing).  [name] aspirin 81 MG tablet Take 1 tablet (81 mg total) by mouth daily.  .  bisoprolol (ZEBETA) 5 MG tablet TAKE 1/2 TABLET BY MOUTH DAILY  . Carbidopa-Levodopa ER (SINEMET CR) 25-100 MG tablet controlled release TAKE 2 TABS IN THE AM, 1 IN AFTERNOON, 1 IN THE EVENING  . clonazePAM (KLONOPIN) 0.5 MG tablet TAKE 0.5 TABLETS (0.25 MG TOTAL) BY MOUTH AT BEDTIME.  [name] gabapentin ([name]) 300 MG capsule TAKE 2 CAPSULES (600 MG TOTAL) BY MOUTH 3 (THREE) TIMES DAILY  . omeprazole (PRILOSEC) 20 MG capsule Take 1 capsule (20 mg total) by mouth daily.  . TRELEGY ELLIPTA 100-62.5-25 MCG/INH AEPB TAKE 1 PUFF BY MOUTH EVERY DAY  . Vitamin D, Ergocalciferol, ([name]) 1.25 MG (50000 UT) CAPS capsule Take 1 capsule (50,000 Units total) by mouth every 7 (seven) days.  . Droxidopa (NORTHERA) 100 MG CAPS Take 100 mg by mouth 3 (three) times daily. (Patient not taking: Reported on 10/02/2019)   No facility-administered encounter medications on file as of 10/02/2019.     PAST MEDICAL HISTORY:   Past Medical History:  Diagnosis Date  . 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 Aortic root aneurysm ([place])   . Arthritis   . Asthma   . Breast cancer ([place]) 2002   infiltrative ductal carcinoma   . Cataract 2019  . COPD (chronic obstructive pulmonary disease) ([place]) 9/08  . Ductal carcinoma of breast, estrogen receptor positive, stage 1 ([place]) 09/16/2012  . Endometriosis   . GERD (gastroesophageal reflux disease)   .  Hypercalcemia 09/14/2013  . Hypertension   . Lesion of breast 1992   right, benign  . Lung disease    secondary to [name] infection  . Mastalgia 7/95  . Osteoporosis, post-menopausal 03/14/2012  . Ovarian cancer ([place]) 2004  . Pancreatitis    secondary to Cholelithiasis  . Parkinson disease ([place]) 02/10/15  . Post-thoracotomy pain syndrome   . Pseudomonas pneumonia ([place]) 5/09  . Uterine fibroid   . Vitamin D deficiency     PAST SURGICAL HISTORY:   Past Surgical History:  Procedure Laterality Date  . ABDOMINAL HYSTERECTOMY     & BSO for  Mucinous borderline tumor of R ovary 2004  . APPENDECTOMY  2004  . BREAST BIOPSY  08/2004   right breast-benign  . BREAST LUMPECTOMY  1992   benign  . CARDIAC CATHETERIZATION  2007   essentially negation for significant CAD  . CHOLECYSTECTOMY  2001  . COLONOSCOPY  2014   Dr [name] , due 2019  . COLONOSCOPY  09/2018   2 TA removed, int hem, no f/u needed (Dr [name])   . ESOPHAGOGASTRODUODENOSCOPY  09/2018   GERD with esophagitis and stricture dilated, antral gastritis negative for H pylori [name])  . LUNG BIOPSY  2002   [name], Dr [name]  . MASTECTOMY MODIFIED RADICAL Left 2002   oral chemotheraphy (tamoxifen then Armidex) no radiation, Dr.[name]  . TEE WITHOUT CARDIOVERSION N/A 12/29/2018   Procedure: TRANSESOPHAGEAL ECHOCARDIOGRAM (TEE);  Surgeon: Wellington Hampshire, MD;  Location: [facility] ORS;  Service: Cardiovascular;  Laterality: N/A;  . TONSILLECTOMY  1946    SOCIAL HISTORY:   Social History   Socioeconomic History  . Marital status: Widowed    Spouse name: Not on file  . Number of children: 0  . Years of education: Not on file  . Highest education level: Not on file  Occupational History  . 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 Financial resource strain: Not on file  . Food insecurity    Worry: Not on file    Inability: Not on file  . Transportation needs    Medical: Not on file    Non-medical: Not on file  Tobacco Use  . Smoking status: Never Smoker  . Smokeless tobacco: Never Used  Substance and Sexual Activity  . Alcohol use: No    Alcohol/week: 0.0 standard drinks  . Drug use: No  . Sexual activity: Never    Birth control/protection: Surgical    Comment: TAH/BSO  Lifestyle  . Physical activity    Days per week: Not on file    Minutes per session: Not on file  . Stress: Not on file  Relationships  . Social Herbalist on phone: Not on file    Gets together: Not on file    Attends  religious service: Not on file    Active member of club or organization: Not on file    Attends meetings of clubs or organizations: Not on file    Relationship status: Not on file  . Intimate partner violence    Fear of current or ex partner: Not on file    Emotionally abused: Not on file    Physically abused: Not on file    Forced sexual activity: Not on file  Other Topics Concern  . 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 Father  Deceased       lung cancer  . Sister  Alive       colon/breast cancer  . Mother  Deceased       emphysema  . Brother  Alive       healthy  . Ethlyn Daniels  (Not Specified)  . Ethlyn Daniels  (Not Specified)  . [place] Deceased  . [place] Deceased  . MGM  Deceased  . MGF  Deceased  . PGM  Deceased  . PGF  Deceased  . [place] Deceased  . [place] Deceased  . [place] Deceased  . Ethlyn Daniels  (Not Specified)  . [place] Deceased  . Cousin  Deceased  . Cousin Jinny Sanders and [place] Deceased  . Neg Hx  (Not Specified)    ROS:  Review of Systems  Constitutional: Positive for malaise/fatigue.  HENT: Negative.   Eyes: Negative.   Respiratory: Negative.   Cardiovascular: Negative.   Gastrointestinal: Negative.   Genitourinary: Negative.   Skin: Negative.   Neurological: Positive for dizziness.  Endo/Heme/Allergies: Negative.     PHYSICAL EXAMINATION:    VITALS:   Vitals:   10/02/19 0830  Pulse: 65  Resp: 16  Temp: 97.6 F (36.4 C)  SpO2: 96%  Weight: 139 lb 3.2 oz (63.1 kg)  Height: 5\' 2"  (1.575 m)   Orthostatic VS for the past 24 hrs (Last 3 readings):  BP- Lying Pulse- Lying  BP- Sitting Pulse- Sitting BP- Standing at 0 minutes Pulse- Standing at 0 minutes BP- Standing at 3 minutes Pulse- Standing at 3 minutes  10/02/19 0837 132/74 60 118/70 65 100/60 65 110/64 65     GEN:  The patient appears stated age and is in NAD. HEENT:  Normocephalic, atraumatic.  The mucous membranes are moist. The superficial temporal arteries are without ropiness or tenderness. CV:  RRR Lungs:  CTAB Neck/HEME:  There are no carotid bruits bilaterally.  Neurological examination:  Orientation: The patient is alert and oriented x3. Cranial nerves: There is good facial symmetry with minimal facial hypomimia. The speech is fluent and clear. Soft palate rises symmetrically and there is no tongue deviation. Hearing is intact to conversational tone. Sensation: Sensation is intact to light touch throughout Motor: Strength is at least antigravity x4.  Movement examination: Tone: There is very mild increased tone in the RUE Abnormal movements: none Coordination:  There is minimal decremation with RAM's, with finger taps on the right.  All other RAMs were good Gait and Station: The patient has no difficulty arising out of a deep-seated chair without the use of the hands. The patient's stride length is good.     ASSESSMENT/PLAN:  1. Parkinsons disease - sx's since 2015 -Continue carbidopa/levodopa 25/100 CR, 2 tabs in the morning, 1 in the afternoon and 1 in the evening.  I am not sure that the change helped her daytime hypersomnolence, but did not change her back to the immediate release, primarily because she is having fairly significant orthostasis.             -think that she would benefit from PT but she declines for now.  States that she will do  the exercises on her own.  Will let me know if changes her mind 2.EDS -don't think that related to PD  -this is the biggest c/o.  Believe biggest offender is the gabapentin.  She is not willing to decrease that  medication.  Did talk to her about other options for this.  States that she is on it for pain/paresthesias around the torso.  Would recommend trying to decrease gabapentin, reducing it at bid dosing or perhaps trying carbamazepine instead.  She will discuss with PCP but doesn't think that she is willing to change but knows that gapabentin is likely the source of the fatigue.   3. Long hx migraine -No longer a candidate for triptan therapy. This is due to cerebral small vessel disease. She does have Cambia for when necessary use. Headaches have been better lately 4.  Orthostatic hypotension, with recent syncopal episode -still has dizziness but won't wear compression binder. Encouraged to do so             -Issue is complex, because blood pressure is actually fairly high in the morning but will drop after she takes her BP meds that she says she needs for her aneurysm.  Orthostatics were positive today but didn't drop terribly low.  Just saw cardiology on September 04, 2019.  Cardiology started her on Northera, fairly low dose, 100 mg 3 times per day.  She has not yet started this, primarily because of cost.  she does tell me today that she actually got it since our last visit a week ago but hasn't started it.  Encouraged her to do so since she actually bought the medication.             -I did discuss with her the risk of supine hypertension, even with Northera, and talk to her about raising the head of the bed or using a wedge pillow. 6. Neck pain/hyperreflexia -MRI cervical spine in April, 2016 just demonstrated mild multilevel cervical spondylosis. 7. REM behavior disorder -Doing well with low-dose clonazepam, 0.5 mg, half tablet at night.  Risks, benefits, side effects and alternative therapies were discussed. The opportunity to ask questions was given and they were answered to the best of my ability. The patient expressed understanding and  willingness to follow the outlined treatment protocols. 8. Mild cognitive impairment -She saw Dr. Si Raider and had neurocognitive testing on August 15, 2017. She has reviewed the results with Dr. Si Raider. There is no evidence of cognitive impairment, including dementia. There is no evidence of a psychiatric disorder. 9.  Follow up is anticipated in the next 6 months, sooner should new neurologic issues arise.  Much greater than 50% of this visit was spent in counseling and coordinating care.  Total face to face time:  25 min  Cc:  [name], MD

## 2019-10-02 ENCOUNTER — Ambulatory Visit: Payer: Medicare Other | Admitting: Neurology

## 2019-10-02 ENCOUNTER — Encounter: Payer: Self-pay | Admitting: Neurology

## 2019-10-02 ENCOUNTER — Other Ambulatory Visit: Payer: Self-pay

## 2019-10-02 VITALS — HR 65 | Temp 97.6°F | Resp 16 | Ht 62.0 in | Wt 139.2 lb

## 2019-10-02 DIAGNOSIS — G903 Multi-system degeneration of the autonomic nervous system: Secondary | ICD-10-CM

## 2019-10-02 DIAGNOSIS — G20 Parkinson's disease: Secondary | ICD-10-CM | POA: Diagnosis not present

## 2019-10-02 NOTE — Patient Instructions (Signed)
1.  Let us know if you decide you want a RX for Physical therapy 2.  Make an appointment with [name], MD   The physicians and staff at [facility] are committed to providing excellent care. You may receive a survey requesting feedback about your experience at our office. We strive to receive "very good" responses to the survey questions. If you feel that your experience would prevent you from giving the office a "very good " response, please contact our office to try to remedy the situation. We may be reached at [phone]. Thank you for taking the time out of your busy day to complete the survey.

## 2019-10-12 ENCOUNTER — Telehealth: Payer: Self-pay | Admitting: Internal Medicine

## 2019-10-12 MED ORDER — OMEPRAZOLE 20 MG PO CPDR: 20.0000 mg | DELAYED_RELEASE_CAPSULE | Freq: Every day | ORAL | 3 refills | Status: DC

## 2019-10-12 NOTE — Telephone Encounter (Signed)
Sent rx for Omeprazole with three refills.  We can see how she feels about an office visit at that time

## 2019-10-26 ENCOUNTER — Encounter: Payer: Self-pay | Admitting: Family Medicine

## 2019-10-26 ENCOUNTER — Ambulatory Visit (INDEPENDENT_AMBULATORY_CARE_PROVIDER_SITE_OTHER): Payer: Medicare Other | Admitting: Family Medicine

## 2019-10-26 ENCOUNTER — Other Ambulatory Visit: Payer: Self-pay

## 2019-10-26 ENCOUNTER — Ambulatory Visit (INDEPENDENT_AMBULATORY_CARE_PROVIDER_SITE_OTHER)
Admission: RE | Admit: 2019-10-26 | Discharge: 2019-10-26 | Disposition: A | Discharge status: Home / Self Care | Payer: Medicare Other | Source: Ambulatory Visit | Attending: Family Medicine | Admitting: Family Medicine

## 2019-10-26 VITALS — BP 114/70 | HR 62 | Temp 97.8°F | Ht 62.0 in | Wt 134.4 lb

## 2019-10-26 DIAGNOSIS — M5441 Lumbago with sciatica, right side: Secondary | ICD-10-CM | POA: Diagnosis not present

## 2019-10-26 DIAGNOSIS — I10 Essential (primary) hypertension: Secondary | ICD-10-CM | POA: Diagnosis not present

## 2019-10-26 DIAGNOSIS — G20A1 Parkinson's disease without dyskinesia, without mention of fluctuations: Secondary | ICD-10-CM

## 2019-10-26 DIAGNOSIS — M810 Age-related osteoporosis without current pathological fracture: Secondary | ICD-10-CM

## 2019-10-26 DIAGNOSIS — I951 Orthostatic hypotension: Secondary | ICD-10-CM

## 2019-10-26 DIAGNOSIS — E538 Deficiency of other specified B group vitamins: Secondary | ICD-10-CM

## 2019-10-26 DIAGNOSIS — G20 Parkinson's disease: Secondary | ICD-10-CM

## 2019-10-26 MED ORDER — CYANOCOBALAMIN 1000 MCG/ML IJ SOLN
1000.0000 ug | Freq: Once | INTRAMUSCULAR | Status: AC
  Administered 2019-10-26: 1000 ug via INTRAMUSCULAR

## 2019-10-26 NOTE — Assessment & Plan Note (Addendum)
Today BP mildly low, but no true orthostasis. Hesitant to remove bisoprolol given HTN hx. Encouraged good hydration status.

## 2019-10-26 NOTE — Assessment & Plan Note (Signed)
B12 shot today. rec continue monthly replacement.

## 2019-10-26 NOTE — Assessment & Plan Note (Addendum)
DEXA earlier this year was cancelled due to machine malfunction. Will reorder

## 2019-10-26 NOTE — Assessment & Plan Note (Signed)
Encouraged starting northera - has Rx at home, was affordable.

## 2019-10-26 NOTE — Patient Instructions (Addendum)
B12 shot today then monthly.  xrays today for lower back pain.  We will update bone density scan.  Ok to continue tylenol 1000mg  twice daily, up to 3 times a day if needed (max dose 3000mg  per day).  Orthostatics ok today.  Ensure good hydration status.  Start northera 100mg  three times a day. If you still have dizziness when you stand up, we may need to hold your bisoprolol (zebeta).  Consider seeing outpatient physical therapy for fall prevention/balance training program.  Good to see you today, call us with questions.

## 2019-10-26 NOTE — Assessment & Plan Note (Signed)
Recent syncopal episode in kitchen with resultant fall with injury. Presumed orthostatic. See above.  Latest MRI 2016 with chronic small vessel disease, has not had carotid US.

## 2019-10-26 NOTE — Assessment & Plan Note (Signed)
After fall, initial improvement now worsening. Anticipate component of R lumbar radiculopathy. Check lumbar films r/o vertebral fracture - no obvious fracture on my read, will await radiology report. Reviewed tylenol use, consider prednisone given h/o intolerance to NSAID and prior GI bleed.

## 2019-10-26 NOTE — Progress Notes (Signed)
This visit was conducted in person.  BP 114/70 (BP Location: Right Arm, Patient Position: Sitting, Cuff Size: Normal)   Pulse 62   Temp 97.8 F (36.6 C) (Temporal)   Ht 5\' 2"  (1.575 m)   Wt 134 lb 6 oz (61 kg)   SpO2 95%   BMI 24.58 kg/m   Orthostatic VS for the past 24 hrs (Last 3 readings):  BP- Lying BP- Standing at 0 minutes  10/26/19 1151 - 112/70  10/26/19 1148 122/76 -    BP Readings from Last 3 Encounters:  10/26/19 114/70  09/04/19 (!) 152/94  07/21/19 140/86    CC: back pain after fall Subjective:    Patient ID: Crystal Bass, female    DOB: 03/06/1941, 78 y.o.   MRN: [id number]  HPI: Crystal Bass is a 78 y.o. female presenting on 10/26/2019 for Back Pain (C/o low back pain radiating down into right leg to knee.  [place] about 6 wks ago. Pain had improved but worsened last week. )   Last seen 06/2019.   DOI: ~3 wks ago ~08/2019 Fall x2 at home due to drop in blood pressure. Latest fall occurred in kitchen, landed on lower back very sore for 3 wks then improved, but over the past week pain has ben worsening again. She did have premonitory symptoms of lightheadedness and weakness. She does think she fully passed out as she remembers waking up on the floor. Describes midline to right lower back pain with some radiation. Pain better sitting or supine worse with standing and walking. Denies numbness/paresthesias or saddle anesthesia of legs. No bowel/bladder incontinence, fevers.   Treating pain with tylenol 1000mg  bid.   Has not yet started northera (100mg  tid) - has this at home. Was approved for financial assistance for this.   Overdue for DEXA (last 2017).  Overdue for vit B12 shot (last 09/03/2019).  Lab Results  Component Value Date   VITAMINB12 279 08/24/2019       Relevant past medical, surgical, family and social history reviewed and updated as indicated. Interim medical history since our last visit reviewed. Allergies and medications reviewed and  updated. Outpatient Medications Prior to Visit  Medication Sig Dispense Refill  . acetaminophen (TYLENOL) 500 MG tablet Take 2 tablets (1,000 mg total) by mouth 2 (two) times daily as needed. (Patient taking differently: Take 1,000 mg by mouth 2 (two) times daily as needed for mild pain or moderate pain. )    . Albuterol Sulfate ([name]) 123XX123 (90 Base) MCG/ACT AEPB Inhale 2 puffs into the lungs every 6 (six) hours as needed (dyspnea, wheezing).    [name] aspirin 81 MG tablet Take 1 tablet (81 mg total) by mouth daily. 30 tablet   . bisoprolol (ZEBETA) 5 MG tablet TAKE 1/2 TABLET BY MOUTH DAILY 45 tablet 3  . Carbidopa-Levodopa ER (SINEMET CR) 25-100 MG tablet controlled release TAKE 2 TABS IN THE AM, 1 IN AFTERNOON, 1 IN THE EVENING 360 tablet 1  . clonazePAM (KLONOPIN) 0.5 MG tablet TAKE 0.5 TABLETS (0.25 MG TOTAL) BY MOUTH AT BEDTIME. 45 tablet 1  . gabapentin ([name]) 300 MG capsule TAKE 2 CAPSULES (600 MG TOTAL) BY MOUTH 3 (THREE) TIMES DAILY 540 capsule 3  . omeprazole (PRILOSEC) 20 MG capsule Take 1 capsule (20 mg total) by mouth daily. 30 capsule 3  . TRELEGY ELLIPTA 100-62.5-25 MCG/INH AEPB TAKE 1 PUFF BY MOUTH EVERY DAY 60 each 1  . Vitamin D, Ergocalciferol, ([name]) 1.25 MG (50000 UT) CAPS  capsule Take 1 capsule (50,000 Units total) by mouth every 7 (seven) days. 12 capsule 2  . Droxidopa (NORTHERA) 100 MG CAPS Take 100 mg by mouth 3 (three) times daily. (Patient not taking: Reported on 10/02/2019) 270 capsule 1   No facility-administered medications prior to visit.      Per HPI unless specifically indicated in ROS section below Review of Systems Objective:    BP 114/70 (BP Location: Right Arm, Patient Position: Sitting, Cuff Size: Normal)   Pulse 62   Temp 97.8 F (36.6 C) (Temporal)   Ht 5\' 2"  (1.575 m)   Wt 134 lb 6 oz (61 kg)   SpO2 95%   BMI 24.58 kg/m   Wt Readings from Last 3 Encounters:  10/26/19 134 lb 6 oz (61 kg)  10/02/19 139 lb 3.2 oz (63.1 kg)   09/16/19 136 lb (61.7 kg)    Physical Exam Vitals signs and nursing note reviewed.  Constitutional:      General: She is not in acute distress.    Appearance: Normal appearance.     Comments: Frail with slowed antalgic gait, using walker  Musculoskeletal:     Comments:  Pain lower lumbar midline spine R lower lumbar paraspinous mm tenderness Neg SLR bilaterally. No groin pain with int/ext rotation at hip. Neg FABER.  Skin:    General: Skin is warm and dry.     Findings: No rash.  Neurological:     Mental Status: She is alert.     Comments: Diminished strength RLE due to pain  Psychiatric:        Mood and Affect: Mood normal.        Behavior: Behavior normal.       Results for orders placed or performed in visit on 08/24/19  Vitamin B12  Result Value Ref Range   Vitamin B-12 279 211 - 911 pg/mL   Assessment & Plan:  This visit occurred during the SARS-CoV-2 public health emergency.  Safety protocols were in place, including screening questions prior to the visit, additional usage of staff PPE, and extensive cleaning of exam room while observing appropriate contact time as indicated for disinfecting solutions.   Problem List Items Addressed This Visit    Vitamin B12 deficiency    B12 shot today. rec continue monthly replacement.       Parkinson disease ([place])   Osteoporosis, post-menopausal    DEXA earlier this year was cancelled due to machine malfunction. Will reorder      Relevant Orders   DG Bone Density   Orthostatic syncope    Recent syncopal episode in kitchen with resultant fall with injury. Presumed orthostatic. See above.  Latest MRI 2016 with chronic small vessel disease, has not had carotid US.       Orthostatic hypotension    Encouraged starting northera - has Rx at home, was affordable.       Essential hypertension    Today BP mildly low, but no true orthostasis. Hesitant to remove bisoprolol given HTN hx. Encouraged good hydration status.        Acute midline low back pain with right-sided sciatica - Primary    After fall, initial improvement now worsening. Anticipate component of R lumbar radiculopathy. Check lumbar films r/o vertebral fracture - no obvious fracture on my read, will await radiology report. Reviewed tylenol use, consider prednisone given h/o intolerance to NSAID and prior GI bleed.       Relevant Orders   DG Lumbar Spine Complete  Meds ordered this encounter  Medications  . cyanocobalamin ((VITAMIN B-12)) injection 1,000 mcg   Orders Placed This Encounter  Procedures  . DG Bone Density    Standing Status:   Future    Standing Expiration Date:   12/25/2020    Order Specific Question:   Reason for Exam (SYMPTOM  OR DIAGNOSIS REQUIRED)    Answer:   f/u osteopenia    Order Specific Question:   Preferred imaging location?    Answer:   [facility]  . DG Lumbar Spine Complete    Standing Status:   Future    Number of Occurrences:   1    Standing Expiration Date:   12/25/2020    Order Specific Question:   Reason for Exam (SYMPTOM  OR DIAGNOSIS REQUIRED)    Answer:   lower back pain after fall 3 wks ago    Order Specific Question:   Preferred imaging location?    Answer:   [facility]    Order Specific Question:   Radiology Contrast Protocol - do NOT remove file path    Answer:   [email or web address]   Patient Instructions  B12 shot today then monthly.  xrays today for lower back pain.  We will update bone density scan.  Ok to continue tylenol 1000mg  twice daily, up to 3 times a day if needed (max dose 3000mg  per day).  Orthostatics ok today.  Ensure good hydration status.  Start northera 100mg  three times a day. If you still have dizziness when you stand up, we may need to hold your bisoprolol (zebeta).  Consider seeing outpatient physical therapy for fall prevention/balance training program.  Good to see you today, call us with questions.    Follow  up plan: No follow-ups on file.  [name], MD

## 2019-10-29 ENCOUNTER — Telehealth: Payer: Self-pay | Admitting: Family Medicine

## 2019-10-29 MED ORDER — PREDNISONE 20 MG PO TABS: ORAL_TABLET | ORAL | 0 refills | Status: DC

## 2019-10-29 NOTE — Telephone Encounter (Signed)
Would offer low dose short prednisone taper to see if any improvement (sent to pharmacy). Would also offer tramadol PRN breakthrough pain.  If worsening pain, would recommend we check lower back MRI for further evaluation.

## 2019-10-29 NOTE — Telephone Encounter (Signed)
Patient was advised to call us back if the pain in her back does not improved. She stated it is not getting better and that Tuesday and Wednesday were really bad,. She stated that today she has not really done anything or moved around so pain is not terrible today  She would like to know if something could be called in for her since it is not doing better

## 2019-10-30 MED ORDER — TRAMADOL HCL 50 MG PO TABS: 25.0000 mg | ORAL_TABLET | Freq: Two times a day (BID) | ORAL | 0 refills | Status: AC | PRN

## 2019-10-30 NOTE — Telephone Encounter (Signed)
Tramadol sent to pharmacy

## 2019-10-30 NOTE — Telephone Encounter (Signed)
I contacted the patient and advised her of Dr. Synthia Innocent recommendations. Patient verbalized understanding. She states she will take the prednisone, and she would like to try Tramadol for breakthrough pain. She will need this sent in to [facility] on [address]   She will let us know if pain does not improve. Thank you!

## 2019-10-30 NOTE — Addendum Note (Signed)
Addended by: [name] on: 10/30/2019 01:38 PM   Modules accepted: Orders

## 2019-10-30 NOTE — Addendum Note (Signed)
Addended by: [name] on: 10/30/2019 01:51 PM   Modules accepted: Orders

## 2019-11-06 ENCOUNTER — Other Ambulatory Visit: Payer: Self-pay | Admitting: Neurology

## 2019-11-06 ENCOUNTER — Other Ambulatory Visit: Payer: Self-pay | Admitting: Family Medicine

## 2019-11-06 ENCOUNTER — Other Ambulatory Visit: Payer: Self-pay | Admitting: Internal Medicine

## 2019-11-09 ENCOUNTER — Telehealth: Payer: Self-pay | Admitting: Internal Medicine

## 2019-11-09 MED ORDER — TRELEGY ELLIPTA 100-62.5-25 MCG/INH IN AEPB: 1.0000 | INHALATION_SPRAY | Freq: Every day | RESPIRATORY_TRACT | 0 refills | Status: DC

## 2019-11-09 NOTE — Telephone Encounter (Signed)
Patient requested a Trelegy refill to be sent to [place]. Refill sent to requested pharmacy.  Nothing further at this time.

## 2019-11-17 ENCOUNTER — Other Ambulatory Visit: Payer: Self-pay

## 2019-11-17 ENCOUNTER — Other Ambulatory Visit (INDEPENDENT_AMBULATORY_CARE_PROVIDER_SITE_OTHER): Payer: Medicare Other

## 2019-11-17 DIAGNOSIS — C561 Malignant neoplasm of right ovary: Secondary | ICD-10-CM

## 2019-11-18 LAB — CA 125: Cancer Antigen (CA) 125: 28.2 U/mL (ref 0.0–38.1)

## 2019-11-19 ENCOUNTER — Encounter: Payer: Self-pay | Admitting: Family Medicine

## 2019-11-19 ENCOUNTER — Ambulatory Visit (INDEPENDENT_AMBULATORY_CARE_PROVIDER_SITE_OTHER): Payer: Medicare Other | Admitting: Family Medicine

## 2019-11-19 ENCOUNTER — Ambulatory Visit: Payer: Medicare Other | Attending: Internal Medicine

## 2019-11-19 VITALS — BP 174/104 | HR 70 | Temp 96.5°F | Ht 62.0 in | Wt 132.0 lb

## 2019-11-19 DIAGNOSIS — Z20822 Contact with and (suspected) exposure to covid-19: Secondary | ICD-10-CM

## 2019-11-19 DIAGNOSIS — J029 Acute pharyngitis, unspecified: Secondary | ICD-10-CM | POA: Diagnosis not present

## 2019-11-19 DIAGNOSIS — I10 Essential (primary) hypertension: Secondary | ICD-10-CM

## 2019-11-19 NOTE — Assessment & Plan Note (Signed)
Not consistent with [phone] however she is higher risk if this is the case. Will send for testing today at [facility] site. Otherwise supportive care measures reviewed. Pt agrees with plan.

## 2019-11-19 NOTE — Assessment & Plan Note (Signed)
Markedly elevated readings noted today - she has not yet started northera. States BP runs high in the mornings before taking her bisoprolol - and just took antihypertensive. Will ask her to monitor BP at home and let us know if staying high - in h/o orthostatic hypotension may need to check BP standing and treat accordingly.

## 2019-11-19 NOTE — Progress Notes (Signed)
Virtual visit completed through [employer]. Due to national recommendations of social distancing due to COVID-19, a virtual visit is felt to be most appropriate for this patient at this time. Reviewed limitations of a virtual visit.   Patient location: home Provider location: [place] at [facility], office If any vitals were documented, they were collected by patient at home unless specified below.    BP (!) 174/104   Pulse 70   Temp (!) 96.5 F (35.8 C)   Ht 5\' 2"  (1.575 m)   Wt 132 lb (59.9 kg)   SpO2 94%   BMI 24.14 kg/m   Elevated on recheck.  CC: ST Subjective:    Patient ID: Crystal Bass, female    DOB: 11/05/1941, 78 y.o.   MRN: [id number]  HPI: Crystal Bass is a 78 y.o. female presenting on 11/19/2019 for Sore Throat (C/o sore throat, HA and runny nose.  Sxs started yesterday.  Tried Allegra and Tylenol, helpful. )   1d h/o scratchy throat, post nasal drip, mild headache and hoarse voice. Chest feels tight.  No fevers/chills, no new cough or dyspnea. Endorses chronic loss of taste and smell. No abd pain, nausea/diarrhea. No sinus congestion or sinus pressure.  Treating with allegra and tylenol and Emergen-C with benefit.  No known sick contacts.  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   Marked hypertension - states normally high first thing in the morning. I asked her to recheck after taking meds and let us know if persistently elevated.      Relevant past medical, surgical, family and social history reviewed and updated as indicated. Interim medical history since our last visit reviewed. Allergies and medications reviewed and updated. Outpatient Medications Prior to Visit  Medication Sig Dispense Refill  . acetaminophen (TYLENOL) 500 MG tablet Take 2 tablets (1,000 mg total) by mouth 2 (two) times daily as needed. (Patient taking differently: Take 1,000 mg by mouth 2 (two) times daily as needed for mild pain or moderate pain. )    .  Albuterol Sulfate ([name]) 123XX123 (90 Base) MCG/ACT AEPB Inhale 2 puffs into the lungs every 6 (six) hours as needed (dyspnea, wheezing).    [name] aspirin 81 MG tablet Take 1 tablet (81 mg total) by mouth daily. 30 tablet   . bisoprolol (ZEBETA) 5 MG tablet TAKE 1/2 TABLET BY MOUTH DAILY 45 tablet 3  . Carbidopa-Levodopa ER (SINEMET CR) 25-100 MG tablet controlled release TAKE 2 TABS IN THE AM, 1 IN AFTERNOON, 1 IN THE EVENING 360 tablet 1  . clonazePAM (KLONOPIN) 0.5 MG tablet TAKE 0.5 TABLETS (0.25 MG TOTAL) BY MOUTH AT BEDTIME. 45 tablet 1  . Fluticasone-Umeclidin-Vilant (TRELEGY ELLIPTA) 100-62.5-25 MCG/INH AEPB Inhale 1 puff into the lungs daily. 60 each 0  . gabapentin ([name]) 300 MG capsule TAKE 2 CAPSULES (600 MG TOTAL) BY MOUTH 3 (THREE) TIMES DAILY 540 capsule 3  . omeprazole (PRILOSEC) 20 MG capsule TAKE 1 CAPSULE BY MOUTH EVERY DAY 90 capsule 1  . Vitamin D, Ergocalciferol, ([name]) 1.25 MG (50000 UT) CAPS capsule TAKE 1 CAPSULE (50,000 UNITS TOTAL) BY MOUTH EVERY 7 (SEVEN) DAYS. 12 capsule 1  . Droxidopa (NORTHERA) 100 MG CAPS Take 100 mg by mouth 3 (three) times daily. (Patient not taking: Reported on 10/02/2019) 270 capsule 1  . predniSONE (DELTASONE) 20 MG tablet Take two tablets daily for 3 days followed by one tablet daily for 3 days 9 tablet 0   No facility-administered medications prior to visit.  Per HPI unless specifically indicated in ROS section below Review of Systems Objective:    BP (!) 174/104   Pulse 70   Temp (!) 96.5 F (35.8 C)   Ht 5\' 2"  (1.575 m)   Wt 132 lb (59.9 kg)   SpO2 94%   BMI 24.14 kg/m   Wt Readings from Last 3 Encounters:  11/19/19 132 lb (59.9 kg)  10/26/19 134 lb 6 oz (61 kg)  10/02/19 139 lb 3.2 oz (63.1 kg)     Physical exam: Gen: alert, NAD, not ill appearing Pulm: speaks in complete sentences without increased work of breathing Psych: normal mood, normal thought content      Assessment & Plan:   Problem List Items  Addressed This Visit    Sore throat - Primary    Not consistent with [id number] however she is higher risk if this is the case. Will send for testing today at [facility] site. Otherwise supportive care measures reviewed. Pt agrees with plan.       Relevant Orders   Novel Coronavirus, NAA ([facility])   Essential hypertension    Markedly elevated readings noted today - she has not yet started northera. States BP runs high in the mornings before taking her bisoprolol - and just took antihypertensive. Will ask her to monitor BP at home and let us know if staying high - in h/o orthostatic hypotension may need to check BP standing and treat accordingly.           No orders of the defined types were placed in this encounter.  Orders Placed This Encounter  Procedures  . Novel Coronavirus, NAA ([facility])    Order Specific Question:   Is this test for diagnosis or screening    Answer:   Diagnosis of ill patient    Order Specific Question:   Symptomatic for COVID-19 as defined by CDC    Answer:   Yes    Order Specific Question:   Date of Symptom Onset    Answer:   11/18/2019    Order Specific Question:   Hospitalized for COVID-19    Answer:   No    Order Specific Question:   Admitted to ICU for COVID-19    Answer:   No    Order Specific Question:   Previously tested for COVID-19    Answer:   No    Order Specific Question:   Resident in a congregate (group) care setting    Answer:   No    Order Specific Question:   Is the patient student?    Answer:   No    Order Specific Question:   Employed in healthcare setting    Answer:   No    Order Specific Question:   Pregnant    Answer:   No    Order Specific Question:   Release to patient    Answer:   Immediate    I discussed the assessment and treatment plan with the patient. The patient was provided an opportunity to ask questions and all were answered. The patient agreed with the plan and demonstrated an understanding of the instructions. The patient  was advised to call back or seek an in-person evaluation if the symptoms worsen or if the condition fails to improve as anticipated.  Follow up plan: No follow-ups on file.  [name], MD

## 2019-11-20 LAB — NOVEL CORONAVIRUS, NAA: SARS-CoV-2, NAA: NOT DETECTED

## 2019-11-22 ENCOUNTER — Telehealth: Payer: Self-pay | Admitting: Family Medicine

## 2019-11-22 NOTE — Telephone Encounter (Signed)
Patient is calling for her negative COVID test result. Patient expressed understanding.

## 2019-11-24 ENCOUNTER — Other Ambulatory Visit: Payer: Medicare Other

## 2019-12-01 ENCOUNTER — Other Ambulatory Visit: Payer: Self-pay

## 2019-12-01 ENCOUNTER — Ambulatory Visit (INDEPENDENT_AMBULATORY_CARE_PROVIDER_SITE_OTHER): Payer: Medicare PPO

## 2019-12-01 DIAGNOSIS — J301 Allergic rhinitis due to pollen: Secondary | ICD-10-CM | POA: Diagnosis not present

## 2019-12-01 DIAGNOSIS — E538 Deficiency of other specified B group vitamins: Secondary | ICD-10-CM | POA: Diagnosis not present

## 2019-12-01 DIAGNOSIS — J3089 Other allergic rhinitis: Secondary | ICD-10-CM | POA: Diagnosis not present

## 2019-12-01 MED ORDER — CYANOCOBALAMIN 1000 MCG/ML IJ SOLN
1000.0000 ug | Freq: Once | INTRAMUSCULAR | Status: AC
  Administered 2019-12-01: 15:00:00 1000 ug via INTRAMUSCULAR

## 2019-12-01 NOTE — Progress Notes (Signed)
Pt received B12 injection in Right Deltoid per her request. Tolerated well.

## 2019-12-08 DIAGNOSIS — J3089 Other allergic rhinitis: Secondary | ICD-10-CM | POA: Diagnosis not present

## 2019-12-08 DIAGNOSIS — J301 Allergic rhinitis due to pollen: Secondary | ICD-10-CM | POA: Diagnosis not present

## 2019-12-11 ENCOUNTER — Other Ambulatory Visit: Payer: Self-pay | Admitting: Internal Medicine

## 2019-12-13 NOTE — Progress Notes (Signed)
Virtual Visit via Telephone Note  I connected with Crystal Bass on 12/14/19 at 11:30 AM EST by telephone and verified that I am speaking with the correct person using two identifiers.  Location: Patient: Home Provider: [facility] - [id number], [place], [place], [place] [address]   I discussed the limitations, risks, security and privacy concerns of performing an evaluation and management service by telephone and the availability of in person appointments. I also discussed with the patient that there may be a patient responsible charge related to this service. The patient expressed understanding and agreed to proceed.  Patient consented to consult via telephone: Yes People present and their role in pt care: Pt   History of Present Illness:  79 year old female never smoker followed in our office for dyspnea on exertion, history of [name] infection, asthma, PE  Past medical history: Hyperlipidemia, hypertension, history of breast cancer, vitamin D deficiency, dysphagia, Parkinson's, chronic fatigue Smoking history: Never smoker Maintenance: none  Patient of Dr. Chase Caller  Chief complaint: 1 year follow-up, needs refill of Trelegy Ellipta   79 year old female never smoker last seen in our office in February/2020.  At that time she has been evaluated for ongoing dyspnea on exertion.  Lab work was performed.  Was instructed to consider bronchoscopy to further evaluate for [name] if dyspnea on exertion work-up.  Patient was instructed to remain on Trelegy Ellipta.  Patient was also dealing with hoarseness and needed to consider referral to ENT in the future was instructed to follow-up in 3 months.  This likely did not have been due to the SARS KB 2 pandemic.  Patient completing televisit with our office today for further follow-up as well as a medication refill.    Overall patient feels that she has been doing okay and at her baseline since last being seen in February/2020.   She admits that she has been practicing home isolation, physical distancing and appropriate hand hygiene during the Covid pandemic.  She did receive her first Covid vaccine on 12/11/2019 and is planned to get her second Covid vaccine on 01/08/2020.  She tolerated the first vaccine well.  She feels that her breathing is at baseline.  She continues to have her baseline fatigue.  Her symptoms have not worsened since last evaluation in February/2020.  She has an occasional productive cough with small green mucus.  She does not have a flutter valve.  Observations/Objective:  11/19/2019-SARS-CoV-2-negative 01/13/2019-acetylcholine receptors test negative 01/13/2019-D-dimer-negative  10/28/2018-CTA chest-resolution of previously seen left upper lobe segmental PE, no new emboli seen, multiple nodular changes throughout the right lung stable from recent exam, these are consistent with given clinical history of [name]  01/13/2019-pulmonary function test-FVC 1.81 (70% predicted), postbronchodilator ratio 77, postbronchodilator FEV1 1.48 (77% predicted), no bronchodilator response, DLCO 17.28 (96% predicted) >>>Patient did use Trelegy Ellipta prior to PFT  10/26/2018-bilateral lower extremity venous Doppler ultrasound-no evidence of DVT  Social History   Tobacco Use  Smoking Status Never Smoker  Smokeless Tobacco Never Used   Immunization History  Administered Date(s) Administered  . Fluad Quad(high Dose 65+) 08/11/2019  . Influenza Split 09/11/2011  . Influenza Whole 09/09/2007, 08/08/2008, 08/15/2010, 08/26/2012  . Influenza,inj,Quad PF,6+ Mos 08/18/2013, 08/20/2014, 08/25/2018  . Influenza-Unspecified 09/27/2015, 08/26/2017  . Pneumococcal Conjugate-13 02/12/2014  . Pneumococcal Polysaccharide-23 11/26/2006, 10/21/2012  . Td 01/01/2011  . Zoster 05/26/2007  . Zoster Recombinat (Shingrix) 02/22/2018, 07/08/2019    Assessment and Plan:  [name] (mycobacterium avium-intracellulare) infection  ([place]) Plan: If patient becomes  symptomatic with worsened productive cough need to culture sputum May need to consider bronchoscopy in the future if patient is unable to produce sputum samples If patient becomes more symptomatic and likely should consider high-resolution CT chest Patient would also be a great candidate for a flutter valve at next in-person office visit We will coordinate 65-month follow-up with our office and continue to monitor clinically  Chronic obstructive airway disease with asthma ([place]) Plan: Continue Trelegy Ellipta May need to consider high-dose Trelegy at next office visit in person, if patient is more symptomatic If patient flares may need to consider biologic medications in the future 27-month follow-up  Healthcare maintenance Plan: Patient is already received first Covid vaccine, has scheduled appointment in February/2020 to receive second dose     Follow Up Instructions:  Return in about 3 months (around 03/13/2020), or if symptoms worsen or fail to improve, for Follow up with Dr. Purnell Shoemaker.   I discussed the assessment and treatment plan with the patient. The patient was provided an opportunity to ask questions and all were answered. The patient agreed with the plan and demonstrated an understanding of the instructions.   The patient was advised to call back or seek an in-person evaluation if the symptoms worsen or if the condition fails to improve as anticipated.  I provided 24 minutes of non-face-to-face time during this encounter.   Crystal Rinne, NP

## 2019-12-14 ENCOUNTER — Other Ambulatory Visit: Payer: Self-pay

## 2019-12-14 ENCOUNTER — Ambulatory Visit (INDEPENDENT_AMBULATORY_CARE_PROVIDER_SITE_OTHER): Payer: Medicare PPO | Admitting: Pulmonary Disease

## 2019-12-14 ENCOUNTER — Encounter: Payer: Self-pay | Admitting: Pulmonary Disease

## 2019-12-14 DIAGNOSIS — Z0000 Encounter for general adult medical examination without abnormal findings: Secondary | ICD-10-CM | POA: Diagnosis not present

## 2019-12-14 DIAGNOSIS — A310 Pulmonary mycobacterial infection: Secondary | ICD-10-CM | POA: Diagnosis not present

## 2019-12-14 DIAGNOSIS — J449 Chronic obstructive pulmonary disease, unspecified: Secondary | ICD-10-CM | POA: Diagnosis not present

## 2019-12-14 MED ORDER — TRELEGY ELLIPTA 100-62.5-25 MCG/INH IN AEPB: 1.0000 | INHALATION_SPRAY | Freq: Every day | RESPIRATORY_TRACT | 6 refills | Status: DC

## 2019-12-14 NOTE — Assessment & Plan Note (Addendum)
Plan: If patient becomes symptomatic with worsened productive cough need to culture sputum May need to consider bronchoscopy in the future if patient is unable to produce sputum samples If patient becomes more symptomatic and likely should consider high-resolution CT chest Patient would also be a great candidate for a flutter valve at next in-person office visit We will coordinate 28-month follow-up with our office and continue to monitor clinically

## 2019-12-14 NOTE — Patient Instructions (Signed)
You were seen today by [name], NP  for:   1. Chronic obstructive airway disease with asthma ([place])  - Fluticasone-Umeclidin-Vilant (TRELEGY ELLIPTA) 100-62.5-25 MCG/INH AEPB; Inhale 1 puff into the lungs daily.  Dispense: 60 each; Refill: 6  Contact our office if you are having worsening symptoms such as shortness of breath, wheezing, cough, congestion  Can also use rescue inhaler as needed for shortness of breath or wheezing  2. [name] (mycobacterium avium-intracellulare) infection ([place])  If you start to have a worsened productive cough or feel he can making more mucus than usual wheeze contact our office so that we we can try to culture the sputum  3. Healthcare maintenance  Great job receiving your first dose of the Covid vaccine Hope the second dose goes well   We recommend today:  No orders of the defined types were placed in this encounter.  No orders of the defined types were placed in this encounter.  Meds ordered this encounter  Medications  . Fluticasone-Umeclidin-Vilant (TRELEGY ELLIPTA) 100-62.5-25 MCG/INH AEPB    Sig: Inhale 1 puff into the lungs daily.    Dispense:  60 each    Refill:  6    Needs ov for further refills    Follow Up:    No follow-ups on file.   Please do your part to reduce the spread of COVID-19:      Reduce your risk of any infection  and COVID19 by using the similar precautions used for avoiding the common cold or flu:  [name] Wash your hands often with soap and warm water for at least 20 seconds.  If soap and water are not readily available, use an alcohol-based hand sanitizer with at least 60% alcohol.  . If coughing or sneezing, cover your mouth and nose by coughing or sneezing into the elbow areas of your shirt or coat, into a tissue or into your sleeve (not your hands). Langley Gauss A MASK when in public  . Avoid shaking hands with others and consider head nods or verbal greetings only. . Avoid touching your eyes, nose, or mouth with  unwashed hands.  . Avoid close contact with people who are sick. . Avoid places or events with large numbers of people in one location, like concerts or sporting events. . If you have some symptoms but not all symptoms, continue to monitor at home and seek medical attention if your symptoms worsen. . If you are having a medical emergency, call 911.   [place] / e-Visit: [email or web address]         [facility]: [facility]: [id number]                   [facility]: [id number]     It is flu season:   >>> Best ways to protect herself from the flu: Receive the yearly flu vaccine, practice good hand hygiene washing with soap and also using hand sanitizer when available, eat a nutritious meals, get adequate rest, hydrate appropriately   Please contact the office if your symptoms worsen or you have concerns that you are not improving.   Thank you for choosing [facility] for your healthcare, and for allowing [place] to partner with you on your healthcare journey. I am thankful to be able to provide care to you today.   Wyn Quaker FNP-C

## 2019-12-14 NOTE — Assessment & Plan Note (Signed)
Plan: Patient is already received first Covid vaccine, has scheduled appointment in February/2020 to receive second dose

## 2019-12-14 NOTE — Assessment & Plan Note (Signed)
Plan: Continue Trelegy Ellipta May need to consider high-dose Trelegy at next office visit in person, if patient is more symptomatic If patient flares may need to consider biologic medications in the future 38-month follow-up

## 2019-12-18 DIAGNOSIS — J3089 Other allergic rhinitis: Secondary | ICD-10-CM | POA: Diagnosis not present

## 2019-12-18 DIAGNOSIS — J301 Allergic rhinitis due to pollen: Secondary | ICD-10-CM | POA: Diagnosis not present

## 2019-12-21 ENCOUNTER — Other Ambulatory Visit: Payer: Self-pay | Admitting: Neurology

## 2019-12-25 DIAGNOSIS — J3089 Other allergic rhinitis: Secondary | ICD-10-CM | POA: Diagnosis not present

## 2019-12-25 DIAGNOSIS — J301 Allergic rhinitis due to pollen: Secondary | ICD-10-CM | POA: Diagnosis not present

## 2020-01-01 DIAGNOSIS — J301 Allergic rhinitis due to pollen: Secondary | ICD-10-CM | POA: Diagnosis not present

## 2020-01-01 DIAGNOSIS — J3089 Other allergic rhinitis: Secondary | ICD-10-CM | POA: Diagnosis not present

## 2020-01-05 ENCOUNTER — Ambulatory Visit: Payer: Medicare Other

## 2020-01-15 ENCOUNTER — Other Ambulatory Visit: Payer: Self-pay | Admitting: Obstetrics & Gynecology

## 2020-01-15 DIAGNOSIS — Z1231 Encounter for screening mammogram for malignant neoplasm of breast: Secondary | ICD-10-CM

## 2020-01-20 ENCOUNTER — Other Ambulatory Visit: Payer: Self-pay | Admitting: Family Medicine

## 2020-01-20 DIAGNOSIS — E538 Deficiency of other specified B group vitamins: Secondary | ICD-10-CM

## 2020-01-20 DIAGNOSIS — E782 Mixed hyperlipidemia: Secondary | ICD-10-CM

## 2020-01-20 DIAGNOSIS — E559 Vitamin D deficiency, unspecified: Secondary | ICD-10-CM

## 2020-01-20 DIAGNOSIS — I10 Essential (primary) hypertension: Secondary | ICD-10-CM

## 2020-01-22 ENCOUNTER — Other Ambulatory Visit (INDEPENDENT_AMBULATORY_CARE_PROVIDER_SITE_OTHER): Payer: Medicare PPO

## 2020-01-22 ENCOUNTER — Other Ambulatory Visit: Payer: Self-pay

## 2020-01-22 DIAGNOSIS — E559 Vitamin D deficiency, unspecified: Secondary | ICD-10-CM | POA: Diagnosis not present

## 2020-01-22 DIAGNOSIS — E538 Deficiency of other specified B group vitamins: Secondary | ICD-10-CM | POA: Diagnosis not present

## 2020-01-22 DIAGNOSIS — I10 Essential (primary) hypertension: Secondary | ICD-10-CM | POA: Diagnosis not present

## 2020-01-22 DIAGNOSIS — E782 Mixed hyperlipidemia: Secondary | ICD-10-CM | POA: Diagnosis not present

## 2020-01-22 LAB — COMPREHENSIVE METABOLIC PANEL
ALT: 1 U/L (ref 0–35)
AST: 12 U/L (ref 0–37)
Albumin: 3.8 g/dL (ref 3.5–5.2)
Alkaline Phosphatase: 81 U/L (ref 39–117)
BUN: 14 mg/dL (ref 6–23)
CO2: 31 mEq/L (ref 19–32)
Calcium: 9.5 mg/dL (ref 8.4–10.5)
Chloride: 104 mEq/L (ref 96–112)
Creatinine, Ser: 0.79 mg/dL (ref 0.40–1.20)
GFR: 70.6 mL/min (ref >60.00)
Glucose, Bld: 100 mg/dL — ABNORMAL HIGH (ref 70–99)
Potassium: 3.8 mEq/L (ref 3.5–5.1)
Sodium: 140 mEq/L (ref 135–145)
Total Bilirubin: 1.4 mg/dL — ABNORMAL HIGH (ref 0.2–1.2)
Total Protein: 6.8 g/dL (ref 6.0–8.3)

## 2020-01-22 LAB — LIPID PANEL
Cholesterol: 176 mg/dL (ref 0–200)
HDL: 48.5 mg/dL (ref >39.00)
LDL Cholesterol: 102 mg/dL — ABNORMAL HIGH (ref 0–99)
NonHDL: 127.24
Total CHOL/HDL Ratio: 4
Triglycerides: 126 mg/dL (ref 0.0–149.0)
VLDL: 25.2 mg/dL (ref 0.0–40.0)

## 2020-01-22 LAB — VITAMIN B12: Vitamin B-12: 386 pg/mL (ref 211–911)

## 2020-01-22 LAB — MICROALBUMIN / CREATININE URINE RATIO
Creatinine,U: 252.5 mg/dL
Microalb Creat Ratio: 2.8 mg/g (ref 0.0–30.0)
Microalb, Ur: 7.1 mg/dL — ABNORMAL HIGH (ref 0.0–1.9)

## 2020-01-22 LAB — VITAMIN D 25 HYDROXY (VIT D DEFICIENCY, FRACTURES): VITD: 84.11 ng/mL (ref 30.00–100.00)

## 2020-01-27 ENCOUNTER — Ambulatory Visit (INDEPENDENT_AMBULATORY_CARE_PROVIDER_SITE_OTHER): Payer: Medicare PPO | Admitting: Family Medicine

## 2020-01-27 ENCOUNTER — Encounter: Payer: Self-pay | Admitting: Family Medicine

## 2020-01-27 ENCOUNTER — Other Ambulatory Visit: Payer: Self-pay

## 2020-01-27 VITALS — BP 136/80 | HR 64 | Temp 97.9°F | Ht 62.0 in | Wt 133.0 lb

## 2020-01-27 DIAGNOSIS — G20 Parkinson's disease: Secondary | ICD-10-CM

## 2020-01-27 DIAGNOSIS — I951 Orthostatic hypotension: Secondary | ICD-10-CM

## 2020-01-27 DIAGNOSIS — M810 Age-related osteoporosis without current pathological fracture: Secondary | ICD-10-CM

## 2020-01-27 DIAGNOSIS — E538 Deficiency of other specified B group vitamins: Secondary | ICD-10-CM

## 2020-01-27 DIAGNOSIS — Z853 Personal history of malignant neoplasm of breast: Secondary | ICD-10-CM

## 2020-01-27 DIAGNOSIS — Z7189 Other specified counseling: Secondary | ICD-10-CM

## 2020-01-27 DIAGNOSIS — E782 Mixed hyperlipidemia: Secondary | ICD-10-CM

## 2020-01-27 DIAGNOSIS — Z0000 Encounter for general adult medical examination without abnormal findings: Secondary | ICD-10-CM | POA: Diagnosis not present

## 2020-01-27 DIAGNOSIS — J449 Chronic obstructive pulmonary disease, unspecified: Secondary | ICD-10-CM

## 2020-01-27 DIAGNOSIS — K769 Liver disease, unspecified: Secondary | ICD-10-CM

## 2020-01-27 DIAGNOSIS — I10 Essential (primary) hypertension: Secondary | ICD-10-CM

## 2020-01-27 DIAGNOSIS — G8929 Other chronic pain: Secondary | ICD-10-CM

## 2020-01-27 DIAGNOSIS — R0609 Other forms of dyspnea: Secondary | ICD-10-CM

## 2020-01-27 DIAGNOSIS — Z66 Do not resuscitate: Secondary | ICD-10-CM

## 2020-01-27 DIAGNOSIS — I2699 Other pulmonary embolism without acute cor pulmonale: Secondary | ICD-10-CM

## 2020-01-27 DIAGNOSIS — E559 Vitamin D deficiency, unspecified: Secondary | ICD-10-CM

## 2020-01-27 DIAGNOSIS — G4752 REM sleep behavior disorder: Secondary | ICD-10-CM | POA: Insufficient documentation

## 2020-01-27 MED ORDER — VITAMIN D 50 MCG (2000 UT) PO CAPS: 1.0000 | ORAL_CAPSULE | Freq: Every day | ORAL | Status: DC

## 2020-01-27 NOTE — Assessment & Plan Note (Addendum)
Appreciate pulm care. She continues trelegy ellipta. Encouraged she call and schedule f/u with Dr Chase Caller

## 2020-01-27 NOTE — Assessment & Plan Note (Signed)
With night terrors. On clonopin 0.25mg  nightly.

## 2020-01-27 NOTE — Assessment & Plan Note (Signed)
Completed 3 months of eliquis, now just continues aspirin daily.

## 2020-01-27 NOTE — Assessment & Plan Note (Signed)
Continue pulm care.

## 2020-01-27 NOTE — Assessment & Plan Note (Deleted)
Preventative protocols reviewed and updated unless pt declined. Discussed healthy diet and lifestyle.  

## 2020-01-27 NOTE — Assessment & Plan Note (Signed)
Followed by Dr Carles Collet - appreciate neuro care.  She continues her sinemet.

## 2020-01-27 NOTE — Assessment & Plan Note (Signed)
Vit D levels 80s. Will transition off 50k weekly replacement to 2000 IU daily.

## 2020-01-27 NOTE — Assessment & Plan Note (Signed)
On gabapentin 600mg  tid. Notes daytime somnolence - suggested trial of 600/300/600mg  dosing.

## 2020-01-27 NOTE — Assessment & Plan Note (Signed)
rec restart monthly B12 shots in 2 wks - to space out from recent Covid shot (only receives shots in right arm due to h/o L mastectomy)

## 2020-01-27 NOTE — Assessment & Plan Note (Signed)

## 2020-01-27 NOTE — Assessment & Plan Note (Signed)
Stable period off Northera.  This in setting of known parkinson disease.

## 2020-01-27 NOTE — Progress Notes (Signed)
This visit was conducted in person.  BP 136/80 (BP Location: Right Arm, Patient Position: Sitting, Cuff Size: Normal)   Pulse 64   Temp 97.9 F (36.6 C) (Temporal)   Ht 5\' 2"  (1.575 m)   Wt 133 lb (60.3 kg)   SpO2 95%   BMI 24.33 kg/m    CC: CPE Subjective:    Patient ID: Crystal Bass, female    DOB: 10/16/1941, 79 y.o.   MRN: [id number]  HPI: Crystal Bass is a 79 y.o. female presenting on 01/27/2020 for Medicare Wellness   Did not see health advisor this year.    Hearing Screening   125Hz  250Hz  500Hz  1000Hz  2000Hz  3000Hz  4000Hz  6000Hz  8000Hz   Right ear:   40 40 20  20    Left ear:   40 25 20  20       Visual Acuity Screening   Right eye Left eye Both eyes  Without correction: 20/30 20/30 20/25   With correction:         Office Visit from 01/27/2020 in [place] at [facility]  PHQ-2 Total Score  1      Fall Risk  01/27/2020 09/16/2019 01/19/2019 12/05/2018 07/17/2018  Falls in the past year? 1 1 1 1  Yes  Comment - - - pt had 1 fall resulting in (3) fractures to R foot and fracture to L patella -  Number falls in past yr: 0 1 1 0 1  Injury with Fall? 0 1 1 1  Yes  Comment - - multiple fractures in left lower extremity - -  Risk Factor Category  - - - - High Fall Risk  Follow up - - - - Falls evaluation completed     ?h/o [name] - monitoring for symptoms followed by pulm last seen 11/2019 - rec 104mo f/u visit. COPD and asthma on trelegy ellipta.   She takes clonazepam for REM sleep disorder.  Takes gabapentin for chronic neuropathic pain - notes excessive daytime somnolence.   Preventative: COLONOSCOPY 09/2018 - 2 TA removed, no f/u needed (Dr [name])  ESOPHAGOGASTRODUODENOSCOPY 09/2018 - GERD with esophagitis and stricture dilated, antral gastritis negative for H pylori [name]) Breast cancer screening -h/o breast cancer s/p L mastectomy 2002. R mammogram WNL 12/2018  Well woman exam -with OBGYN Dr Sabra Heck. 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 Last seen 11/2018. Lung cancer screening -not eligible DEXA scan -known osteoporosis previously on zometa (followed by Dr Payton Mccallum at cancer center).Pending rpt 03/2020 Flu shotyearly Td 2012 Pneumovax 2013, prevnar 2015 Moderna covid vaccine completed 01/20/2020 zostavax 2008 shingrix - 01/2018 ([facility]), 06/2019 Advanced directive form scanned 01/2018 - HCPOA is Solon [place]. Does not want life prolonging measures if terminally ill. DNR (confirmed 01/2020). MOST form filled out 01/2020.  Seat belt use discussed Sunscreen use discussed. No changing moles on skin. Has seen derm.  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 Interim medical history since our last visit reviewed. Allergies and medications reviewed and updated. Outpatient Medications Prior to Visit  Medication Sig Dispense Refill  . acetaminophen (TYLENOL) 500 MG  tablet Take 2 tablets (1,000 mg total) by mouth 2 (two) times daily as needed. (Patient taking differently: Take 1,000 mg by mouth 2 (two) times daily as needed for mild pain or moderate pain. )    . Albuterol Sulfate ([name]) 123XX123 (90 Base) MCG/ACT AEPB Inhale 2 puffs into the lungs every 6 (six) hours as needed (dyspnea, wheezing).    [name] aspirin 81 MG tablet Take 1 tablet (81 mg total) by mouth daily. 30 tablet   . bisoprolol (ZEBETA) 5 MG tablet TAKE 1/2 TABLET BY MOUTH DAILY 45 tablet 3  . Carbidopa-Levodopa ER (SINEMET CR) 25-100 MG tablet controlled release TAKE 2 TABS IN THE AM, 1 IN AFTERNOON, 1 IN THE EVENING 360 tablet 1  . clonazePAM  (KLONOPIN) 0.5 MG tablet TAKE 0.5 TABLETS (0.25 MG TOTAL) BY MOUTH AT BEDTIME. 45 tablet 1  . Fluticasone-Umeclidin-Vilant (TRELEGY ELLIPTA) 100-62.5-25 MCG/INH AEPB Inhale 1 puff into the lungs daily. 60 each 6  . gabapentin ([name]) 300 MG capsule TAKE 2 CAPSULES (600 MG TOTAL) BY MOUTH 3 (THREE) TIMES DAILY 540 capsule 3  . omeprazole (PRILOSEC) 20 MG capsule TAKE 1 CAPSULE BY MOUTH EVERY DAY 90 capsule 1  . Vitamin D, Ergocalciferol, ([name]) 1.25 MG (50000 UT) CAPS capsule TAKE 1 CAPSULE (50,000 UNITS TOTAL) BY MOUTH EVERY 7 (SEVEN) DAYS. 12 capsule 1  . Droxidopa (NORTHERA) 100 MG CAPS Take 100 mg by mouth 3 (three) times daily. (Patient not taking: Reported on 10/02/2019) 270 capsule 1   No facility-administered medications prior to visit.     Per HPI unless specifically indicated in ROS section below Review of Systems  Constitutional: Negative for activity change, appetite change, chills, fatigue, fever and unexpected weight change.  HENT: Negative for hearing loss.   Eyes: Negative for visual disturbance.  Respiratory: Positive for cough, shortness of breath and wheezing. Negative for chest tightness.   Cardiovascular: Negative for chest pain, palpitations and leg swelling.  Gastrointestinal: Positive for diarrhea (occasional). Negative for abdominal distention, abdominal pain, blood in stool, constipation, nausea and vomiting.  Genitourinary: Negative for difficulty urinating and hematuria.  Musculoskeletal: Negative for arthralgias, myalgias and neck pain.  Skin: Negative for rash.  Neurological: Positive for dizziness. Negative for seizures, syncope and headaches.  Hematological: Negative for adenopathy. Does not bruise/bleed easily.  Psychiatric/Behavioral: Negative for dysphoric mood. The patient is not nervous/anxious.    Objective:    BP 136/80 (BP Location: Right Arm, Patient Position: Sitting, Cuff Size: Normal)   Pulse 64   Temp 97.9 F (36.6 C) (Temporal)   Ht 5'  2" (1.575 m)   Wt 133 lb (60.3 kg)   SpO2 95%   BMI 24.33 kg/m   Wt Readings from Last 3 Encounters:  01/27/20 133 lb (60.3 kg)  11/19/19 132 lb (59.9 kg)  10/26/19 134 lb 6 oz (61 kg)    Physical Exam Vitals and nursing note reviewed.  Constitutional:      General: She is not in acute distress.    Appearance: Normal appearance. She is well-developed. She is not ill-appearing.  HENT:     Head: Normocephalic and atraumatic.     Right Ear: Hearing, tympanic membrane, ear canal and external ear normal.     Left Ear: Hearing, tympanic membrane, ear canal and external ear normal.     Mouth/Throat:     Pharynx: Uvula midline.  Eyes:     General: No scleral icterus.    Extraocular Movements: Extraocular movements intact.     Conjunctiva/sclera:  Conjunctivae normal.     Pupils: Pupils are equal, round, and reactive to light.  Neck:     Thyroid: No thyromegaly or thyroid tenderness.  Cardiovascular:     Rate and Rhythm: Normal rate and regular rhythm.     Pulses: Normal pulses.          Radial pulses are 2+ on the right side and 2+ on the left side.     Heart sounds: Murmur (4/6 late systolic best at apex) present.  Pulmonary:     Effort: Pulmonary effort is normal. No respiratory distress.     Breath sounds: No wheezing, rhonchi or rales.     Comments: Coarse crackles throughout predominantly at bases Abdominal:     General: Abdomen is flat. Bowel sounds are normal. There is no distension.     Palpations: Abdomen is soft. There is no mass.     Tenderness: There is no abdominal tenderness. There is no guarding or rebound.     Hernia: No hernia is present.  Musculoskeletal:        General: Normal range of motion.     Cervical back: Normal range of motion and neck supple.     Right lower leg: No edema.     Left lower leg: No edema.  Lymphadenopathy:     Cervical: No cervical adenopathy.  Skin:    General: Skin is warm and dry.     Findings: No rash.  Neurological:      General: No focal deficit present.     Mental Status: She is alert and oriented to person, place, and time.     Comments:  CN grossly intact, station and gait intact Recall 3/3 Calculation 5/5 DLROW  Psychiatric:        Mood and Affect: Mood normal.        Behavior: Behavior normal.        Thought Content: Thought content normal.        Judgment: Judgment normal.       Results for orders placed or performed in visit on 01/22/20  VITAMIN D 25 Hydroxy (Vit-D Deficiency, Fractures)  Result Value Ref Range   VITD 84.11 30.00 - 100.00 ng/mL  Vitamin B12  Result Value Ref Range   Vitamin B-12 386 211 - 911 pg/mL  Lipid panel  Result Value Ref Range   Cholesterol 176 0 - 200 mg/dL   Triglycerides 126.0 0.0 - 149.0 mg/dL   HDL 48.50 >39.00 mg/dL   VLDL 25.2 0.0 - 40.0 mg/dL   LDL Cholesterol 102 (H) 0 - 99 mg/dL   Total CHOL/HDL Ratio 4    NonHDL 127.24   Comprehensive metabolic panel  Result Value Ref Range   Sodium 140 135 - 145 mEq/L   Potassium 3.8 3.5 - 5.1 mEq/L   Chloride 104 96 - 112 mEq/L   CO2 31 19 - 32 mEq/L   Glucose, Bld 100 (H) 70 - 99 mg/dL   BUN 14 6 - 23 mg/dL   Creatinine, Ser 0.79 0.40 - 1.20 mg/dL   Total Bilirubin 1.4 (H) 0.2 - 1.2 mg/dL   Alkaline Phosphatase 81 39 - 117 U/L   AST 12 0 - 37 U/L   ALT 1 0 - 35 U/L   Total Protein 6.8 6.0 - 8.3 g/dL   Albumin 3.8 3.5 - 5.2 g/dL   GFR 70.60 >60.00 mL/min   Calcium 9.5 8.4 - 10.5 mg/dL  Microalbumin / creatinine urine ratio  Result Value Ref Range  Microalb, Ur 7.1 (H) 0.0 - 1.9 mg/dL   Creatinine,U 252.5 mg/dL   Microalb Creat Ratio 2.8 0.0 - 30.0 mg/g   Depression screen [facility] 2/9 01/27/2020 01/19/2019 01/14/2018 12/27/2016  Decreased Interest 0 0 0 1  Down, Depressed, Hopeless 1 0 0 0  PHQ - 2 Score 1 0 0 1  Altered sleeping - 0 0 -  Tired, decreased energy - 0 0 -  Change in appetite - 0 0 -  Feeling bad or failure about yourself  - 0 0 -  Trouble concentrating - 0 0 -  Moving slowly or  fidgety/restless - 0 0 -  Suicidal thoughts - 0 0 -  PHQ-9 Score - 0 0 -  Difficult doing work/chores - Not difficult at all Not difficult at all -  Some recent data might be hidden  No flowsheet data found.  Assessment & Plan:  This visit occurred during the SARS-CoV-2 public health emergency.  Safety protocols were in place, including screening questions prior to the visit, additional usage of staff PPE, and extensive cleaning of exam room while observing appropriate contact time as indicated for disinfecting solutions.   Problem List Items Addressed This Visit    Vitamin D deficiency    Vit D levels 80s. Will transition off 50k weekly replacement to 2000 IU daily.      Vitamin B12 deficiency    rec restart monthly B12 shots in 2 wks - to space out from recent Covid shot (only receives shots in right arm due to h/o L mastectomy)      REM sleep behavior disorder    With night terrors. On clonopin 0.25mg  nightly.       Pulmonary embolism and infarction [facility])    Completed 3 months of eliquis, now just continues aspirin daily.      Parkinson disease ([place])    Followed by Dr Carles Collet - appreciate neuro care.  She continues her sinemet.      Osteoporosis, post-menopausal    Upcoming DEXA + mammogram, followed by GYN. dexa postponed due to covid and then machine malfunction.       Relevant Medications   Cholecalciferol (VITAMIN D) 50 MCG (2000 UT) CAPS   Orthostatic hypotension    Stable period off Northera.  This in setting of known parkinson disease.       RESOLVED: Mixed hyperlipidemia    No significant trouble with cholesterol levels. Not on statin. Will resolve problem.       Medicare annual wellness visit, subsequent - Primary    I have personally reviewed the Medicare Annual Wellness questionnaire and have noted 1. The patient's medical and social history 2. Their use of alcohol, tobacco or illicit drugs 3. Their current medications and supplements 4. The patient's  functional ability including ADL's, fall risks, home safety risks and hearing or visual impairment. Cognitive function has been assessed and addressed as indicated.  5. Diet and physical activity 6. Evidence for depression or mood disorders The patients weight, height, BMI have been recorded in the chart. I have made referrals, counseling and provided education to the patient based on review of the above and I have provided the pt with a written personalized care plan for preventive services. Provider list updated.. See scanned questionairre as needed for further documentation. Reviewed preventative protocols and updated unless pt declined.       Liver lesion    Thought AVM found 2019. LFTs normal.       History of left breast cancer  Continue yearly unilateral mammograms      Health maintenance examination    Preventative protocols reviewed and updated unless pt declined. Discussed healthy diet and lifestyle.       Essential hypertension    Chronic, BP stable today on bisoprolol - continue.  She is not taking northera.       DNR (do not resuscitate)    Confirmed with patient.  MOST form filled out with patient.       Chronic obstructive airway disease with asthma (HCC)    Appreciate pulm care. She continues trelegy ellipta. Encouraged she call and schedule f/u with Dr Chase Caller      Chronic neuropathic pain    On gabapentin 600mg  tid. Notes daytime somnolence - suggested trial of 600/300/600mg  dosing.       Chronic dyspnea    Continue pulm care.       Advanced care planning/counseling discussion    Advanced directive form scanned 01/2018 - HCPOA is Solon [place]. Does not want life prolonging measures if terminally ill. DNR (confirmed 01/2020). MOST form filled out 01/2020.           Meds ordered this encounter  Medications  . Cholecalciferol (VITAMIN D) 50 MCG (2000 UT) CAPS    Sig: Take 1 capsule (2,000 Units total) by mouth daily.    Dispense:  30 capsule    No orders of the defined types were placed in this encounter.   Patient instructions: Schedule nurse visit for b12 shot in 2 weeks.  MOST form filled out today.  Try decreasing gabapentin to 600/300/600mg  to help sleepiness.  Transition from weekly Rx strength vit D to daily 2000 units over the counter D3.  Return in 6 months for follow up visit.   Follow up plan: Return in about 6 months (around 07/29/2020) for follow up visit.  [name], MD

## 2020-01-27 NOTE — Assessment & Plan Note (Addendum)
Chronic, BP stable today on bisoprolol - continue.  She is not taking northera.

## 2020-01-27 NOTE — Patient Instructions (Addendum)
Schedule nurse visit for b12 shot in 2 weeks.  MOST form filled out today.  Try decreasing gabapentin to 600/300/600mg  to help sleepiness.  Transition from weekly Rx strength vit D to daily 2000 units over the counter D3.  Return in 6 months for follow up visit.   Health Maintenance After Age 77 After age 10, you are at a higher risk for certain long-term diseases and infections as well as injuries from falls. Falls are a major cause of broken bones and head injuries in people who are older than age 16. Getting regular preventive care can help to keep you healthy and well. Preventive care includes getting regular testing and making lifestyle changes as recommended by your health care provider. Talk with your health care provider about:  Which screenings and tests you should have. A screening is a test that checks for a disease when you have no symptoms.  A diet and exercise plan that is right for you. What should I know about screenings and tests to prevent falls? Screening and testing are the best ways to find a health problem early. Early diagnosis and treatment give you the best chance of managing medical conditions that are common after age 65. Certain conditions and lifestyle choices may make you more likely to have a fall. Your health care provider may recommend:  Regular vision checks. Poor vision and conditions such as cataracts can make you more likely to have a fall. If you wear glasses, make sure to get your prescription updated if your vision changes.  Medicine review. Work with your health care provider to regularly review all of the medicines you are taking, including over-the-counter medicines. Ask your health care provider about any side effects that may make you more likely to have a fall. Tell your health care provider if any medicines that you take make you feel dizzy or sleepy.  Osteoporosis screening. Osteoporosis is a condition that causes the bones to get weaker. This can  make the bones weak and cause them to break more easily.  Blood pressure screening. Blood pressure changes and medicines to control blood pressure can make you feel dizzy.  Strength and balance checks. Your health care provider may recommend certain tests to check your strength and balance while standing, walking, or changing positions.  Foot health exam. Foot pain and numbness, as well as not wearing proper footwear, can make you more likely to have a fall.  Depression screening. You may be more likely to have a fall if you have a fear of falling, feel emotionally low, or feel unable to do activities that you used to do.  Alcohol use screening. Using too much alcohol can affect your balance and may make you more likely to have a fall. What actions can I take to lower my risk of falls? General instructions  Talk with your health care provider about your risks for falling. Tell your health care provider if: ? You fall. Be sure to tell your health care provider about all falls, even ones that seem minor. ? You feel dizzy, sleepy, or off-balance.  Take over-the-counter and prescription medicines only as told by your health care provider. These include any supplements.  Eat a healthy diet and maintain a healthy weight. A healthy diet includes low-fat dairy products, low-fat (lean) meats, and fiber from whole grains, beans, and lots of fruits and vegetables. Home safety  Remove any tripping hazards, such as rugs, cords, and clutter.  Install safety equipment such as grab bars  in bathrooms and safety rails on stairs.  Keep rooms and walkways well-lit. Activity   Follow a regular exercise program to stay fit. This will help you maintain your balance. Ask your health care provider what types of exercise are appropriate for you.  If you need a cane or walker, use it as recommended by your health care provider.  Wear supportive shoes that have nonskid soles. Lifestyle  Do not drink  alcohol if your health care provider tells you not to drink.  If you drink alcohol, limit how much you have: ? 0-1 drink a day for women. ? 0-2 drinks a day for men.  Be aware of how much alcohol is in your drink. In the [place] one drink equals one typical bottle of beer (12 oz), one-half glass of wine (5 oz), or one shot of hard liquor (1 oz).  Do not use any products that contain nicotine or tobacco, such as cigarettes and e-cigarettes. If you need help quitting, ask your health care provider. Summary  Having a healthy lifestyle and getting preventive care can help to protect your health and wellness after age 52.  Screening and testing are the best way to find a health problem early and help you avoid having a fall. Early diagnosis and treatment give you the best chance for managing medical conditions that are more common for people who are older than age 69.  Falls are a major cause of broken bones and head injuries in people who are older than age 41. Take precautions to prevent a fall at home.  Work with your health care provider to learn what changes you can make to improve your health and wellness and to prevent falls. This information is not intended to replace advice given to you by your health care provider. Make sure you discuss any questions you have with your health care provider. Document Revised: 03/05/2019 Document Reviewed: 09/25/2017 Elsevier Patient Education  2020 [employer].

## 2020-01-27 NOTE — Assessment & Plan Note (Signed)
Continue yearly unilateral mammograms

## 2020-01-27 NOTE — Assessment & Plan Note (Addendum)
Upcoming DEXA + mammogram, followed by GYN. dexa postponed due to covid and then machine malfunction.

## 2020-01-27 NOTE — Assessment & Plan Note (Signed)
Preventative protocols reviewed and updated unless pt declined. Discussed healthy diet and lifestyle.  

## 2020-01-27 NOTE — Assessment & Plan Note (Addendum)
Advanced directive form scanned 01/2018 - HCPOA is Crystal Bass. Does not want life prolonging measures if terminally ill. DNR (confirmed 01/2020). MOST form filled out 01/2020.

## 2020-01-27 NOTE — Assessment & Plan Note (Addendum)
Confirmed with patient.  MOST form filled out with patient.

## 2020-01-27 NOTE — Assessment & Plan Note (Signed)
Thought AVM found 2019. LFTs normal.

## 2020-01-27 NOTE — Assessment & Plan Note (Signed)
No significant trouble with cholesterol levels. Not on statin. Will resolve problem.

## 2020-02-07 ENCOUNTER — Other Ambulatory Visit: Payer: Self-pay | Admitting: Family Medicine

## 2020-02-09 ENCOUNTER — Ambulatory Visit: Payer: Medicare Other

## 2020-02-09 DIAGNOSIS — J3089 Other allergic rhinitis: Secondary | ICD-10-CM | POA: Diagnosis not present

## 2020-02-09 DIAGNOSIS — J301 Allergic rhinitis due to pollen: Secondary | ICD-10-CM | POA: Diagnosis not present

## 2020-02-10 ENCOUNTER — Ambulatory Visit (INDEPENDENT_AMBULATORY_CARE_PROVIDER_SITE_OTHER): Payer: Medicare PPO

## 2020-02-10 ENCOUNTER — Other Ambulatory Visit: Payer: Self-pay

## 2020-02-10 DIAGNOSIS — E538 Deficiency of other specified B group vitamins: Secondary | ICD-10-CM

## 2020-02-10 MED ORDER — CYANOCOBALAMIN 1000 MCG/ML IJ SOLN
1000.0000 ug | Freq: Once | INTRAMUSCULAR | Status: AC
  Administered 2020-02-10: 1000 ug via INTRAMUSCULAR

## 2020-02-10 NOTE — Progress Notes (Signed)
Per orders of Dr. Gutierrez, injection of B12 given by Keaun Schnabel. Patient tolerated injection well.  

## 2020-02-16 DIAGNOSIS — J4530 Mild persistent asthma, uncomplicated: Secondary | ICD-10-CM | POA: Diagnosis not present

## 2020-02-16 DIAGNOSIS — J3089 Other allergic rhinitis: Secondary | ICD-10-CM | POA: Diagnosis not present

## 2020-02-16 DIAGNOSIS — J301 Allergic rhinitis due to pollen: Secondary | ICD-10-CM | POA: Diagnosis not present

## 2020-02-25 DIAGNOSIS — J301 Allergic rhinitis due to pollen: Secondary | ICD-10-CM | POA: Diagnosis not present

## 2020-02-25 DIAGNOSIS — J3089 Other allergic rhinitis: Secondary | ICD-10-CM | POA: Diagnosis not present

## 2020-02-29 NOTE — Progress Notes (Signed)
Agree 

## 2020-03-04 DIAGNOSIS — J301 Allergic rhinitis due to pollen: Secondary | ICD-10-CM | POA: Diagnosis not present

## 2020-03-04 DIAGNOSIS — J3089 Other allergic rhinitis: Secondary | ICD-10-CM | POA: Diagnosis not present

## 2020-03-15 ENCOUNTER — Ambulatory Visit: Payer: Medicare Other

## 2020-03-15 DIAGNOSIS — J4530 Mild persistent asthma, uncomplicated: Secondary | ICD-10-CM | POA: Diagnosis not present

## 2020-03-15 DIAGNOSIS — J301 Allergic rhinitis due to pollen: Secondary | ICD-10-CM | POA: Diagnosis not present

## 2020-03-15 DIAGNOSIS — J3089 Other allergic rhinitis: Secondary | ICD-10-CM | POA: Diagnosis not present

## 2020-03-22 ENCOUNTER — Other Ambulatory Visit: Payer: Self-pay

## 2020-03-22 ENCOUNTER — Ambulatory Visit (INDEPENDENT_AMBULATORY_CARE_PROVIDER_SITE_OTHER): Payer: Medicare PPO

## 2020-03-22 DIAGNOSIS — E538 Deficiency of other specified B group vitamins: Secondary | ICD-10-CM

## 2020-03-22 MED ORDER — CYANOCOBALAMIN 1000 MCG/ML IJ SOLN
1000.0000 ug | Freq: Once | INTRAMUSCULAR | Status: AC
  Administered 2020-03-22: 1000 ug via INTRAMUSCULAR

## 2020-03-22 NOTE — Progress Notes (Signed)
79 y.o. G0P0000 Widowed White or Caucasian female here for annual exam.  Having a lot of SOB today.  She had virtual visit with Dr. Warner Mccreedy.  Does need to be seen but hasn't called yet.    Decided last year to stop allergy shots last week after taking them for 47 years.  Pt is not sure this makes sense to her.    Denies vaginal bleeding.    No LMP recorded. Patient has had a hysterectomy.          Sexually active: No.  The current method of family planning is status post hysterectomy.    Exercising: Yes.    usually but not in past week Smoker:  no  Health Maintenance: Pap:  10-06-08 History of abnormal Pap:  no MMG:  01-20-2019 rt breast category c density birads 1:neg, left breast mastectomy, scheduled.  Has appt for this and BMD in May.   Colonoscopy:  10-09-18 Dr [name], no follow up needed BMD:   02-22-16 osteopenia.  Appt is scheduled for May.  TDaP:  2012 Pneumonia vaccine(s):  2015 Shingrix:   done Hep C testing: not done Screening Labs: poct urine-rbc tr, wbc 2+   reports that she has never smoked. She has never used smokeless tobacco. She reports that she does not drink alcohol or use drugs.  Past Medical History:  Diagnosis Date  . 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 Aortic root aneurysm ([place])   . Arthritis   . Asthma   . Breast cancer ([place]) 2002   infiltrative ductal carcinoma   . Cataract 2019  . COPD (chronic obstructive pulmonary disease) ([place]) 9/08  . Ductal carcinoma of breast, estrogen receptor positive, stage 1 ([place]) 09/16/2012  . Endometriosis   . GERD (gastroesophageal reflux disease)   . Hypercalcemia 09/14/2013  . Hypertension   . Lesion of breast 1992   right, benign  . Lung disease    secondary to [name] infection  . Mastalgia 7/95  . Osteoporosis, post-menopausal 03/14/2012  . Ovarian cancer ([place]) 2004  . Pancreatitis    secondary to Cholelithiasis  . Parkinson disease ([place]) 02/10/15  . Post-thoracotomy pain syndrome    . Pseudomonas pneumonia ([place]) 5/09  . Uterine fibroid   . Vitamin D deficiency     Past Surgical History:  Procedure Laterality Date  . 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 APPENDECTOMY  2004  . BREAST BIOPSY  08/2004   right breast-benign  . BREAST LUMPECTOMY  1992   benign  . CARDIAC CATHETERIZATION  2007   essentially negation for significant CAD  . CHOLECYSTECTOMY  2001  . COLONOSCOPY  2014   Dr [name] , due 2019  . COLONOSCOPY  09/2018   2 TA removed, int hem, no f/u needed (Dr [name])   . ESOPHAGOGASTRODUODENOSCOPY  09/2018   GERD with esophagitis and stricture dilated, antral gastritis negative for H pylori [name])  . LUNG BIOPSY  2002   [name], Dr [name]  . MASTECTOMY MODIFIED RADICAL Left 2002   oral chemotheraphy (tamoxifen then Armidex) no radiation, Dr.[name]  . TEE WITHOUT CARDIOVERSION N/A 12/29/2018   Procedure: TRANSESOPHAGEAL ECHOCARDIOGRAM (TEE);  Surgeon: Wellington Hampshire, MD;  Location: [facility] ORS;  Service: Cardiovascular;  Laterality: N/A;  . TONSILLECTOMY  1946    Current Outpatient Medications  Medication Sig Dispense Refill  . acetaminophen (TYLENOL) 500 MG tablet Take  2 tablets (1,000 mg total) by mouth 2 (two) times daily as needed. (Patient taking differently: Take 1,000 mg by mouth 2 (two) times daily as needed for mild pain or moderate pain. )    . aspirin 81 MG tablet Take 1 tablet (81 mg total) by mouth daily. 30 tablet   . bisoprolol (ZEBETA) 5 MG tablet TAKE 1/2 TABLET BY MOUTH DAILY 45 tablet 3  . Carbidopa-Levodopa ER (SINEMET CR) 25-100 MG tablet controlled release TAKE 2 TABS IN THE AM, 1 IN AFTERNOON, 1 IN THE EVENING 360 tablet 1  . clonazePAM (KLONOPIN) 0.5 MG tablet TAKE 0.5 TABLETS (0.25 MG TOTAL) BY MOUTH AT BEDTIME. 45 tablet 1  . Fluticasone-Umeclidin-Vilant (TRELEGY ELLIPTA) 100-62.5-25 MCG/INH AEPB Inhale 1 puff into the lungs daily. 60 each 6  . gabapentin ([name]) 300 MG capsule  TAKE 2 CAPSULES (600 MG TOTAL) BY MOUTH 3 (THREE) TIMES DAILY 540 capsule 1  . omeprazole (PRILOSEC) 20 MG capsule TAKE 1 CAPSULE BY MOUTH EVERY DAY 90 capsule 1  . VITAMIN D PO Take 50,000 Int'l Units by mouth once a week.    [name] EPINEPHrine 0.3 mg/0.3 mL IJ SOAJ injection      No current facility-administered medications for this visit.    Family History  Problem Relation Age of Onset  . Lung cancer Father 4       d.82 history of smoking  . Breast cancer Sister 46  . Colon cancer Sister   . Emphysema Mother        d.64 was never a smoker  . Stroke Paternal Aunt        in mid 51s  . Osteoporosis Paternal Aunt   . Melanoma Maternal Aunt   . Cancer Maternal Uncle        unspecified type  . Cancer Maternal Grandmother        d.82s unspecified GI cancer  . Cancer Maternal Grandfather        d.62s unspecified type  . Liver cancer Maternal Aunt   . Cancer Maternal Uncle        unspecified type  . Cancer Maternal Uncle        unspecified type  . Myasthenia gravis Paternal Aunt   . Breast cancer Cousin        d.60s-daughter of unaffected paternal aunt Eustace Moore  . Colon cancer Cousin 95       d.70-daughter of unaffected maternal aunt Leila  . Lung cancer Cousin 70       d.70-sisters to each other, both daughters of maternal uncle Johnny  . Diabetes Neg Hx   . Heart disease Neg Hx   . Stomach cancer Neg Hx   . Ulcerative colitis Neg Hx     Review of Systems  Constitutional: Negative.   HENT: Negative.   Eyes: Negative.   Respiratory: Negative.   Cardiovascular: Negative.   Gastrointestinal: Negative.   Endocrine: Negative.   Genitourinary:       Pelvic discomfort rt side  Musculoskeletal: Negative.   Skin: Negative.   Allergic/Immunologic: Negative.   Neurological: Negative.   Psychiatric/Behavioral: Negative.     Exam:   BP 124/84   Pulse 68   Temp (!) 97 F (36.1 C) (Skin)   Resp 16   Ht 5' 3.25" (1.607 m)   Wt 134 lb (60.8 kg)   BMI 23.55 kg/m   Height: 5'  3.25" (160.7 cm)  General appearance: alert, cooperative and appears stated age Head: Normocephalic, without obvious abnormality, atraumatic Neck:  no adenopathy, supple, symmetrical, trachea midline and thyroid normal to inspection and palpation Lungs: clear to auscultation bilaterally Breasts: normal appearance, no masses or tenderness Heart: regular rate and rhythm Abdomen: soft, non-tender; bowel sounds normal; no masses,  no organomegaly Extremities: extremities normal, atraumatic, no cyanosis or edema Skin: Skin color, texture, turgor normal. No rashes or lesions Lymph nodes: Cervical, supraclavicular, and axillary nodes normal. No abnormal inguinal nodes palpated Neurologic: Grossly normal   Pelvic: External genitalia:  no lesions              Urethra:  normal appearing urethra with no masses, tenderness or lesions              Bartholins and Skenes: normal                 Vagina: normal appearing vagina with normal color and discharge, no lesions              Cervix: absent              Pap taken: No. Bimanual Exam:  Uterus:  uterus absent              Adnexa: no mass, fullness, tenderness               Rectovaginal: Confirms               Anus:  normal sphincter tone, no lesions  Chaperone, Terence Lux, CMA, was present for exam.  A:  Well Woman with normal exam PMP, no HRT Possible cystitis H/o borderline mucinous ovarian cancer, stage 1C, h/o TAH/BSO 2004 Osteoporosis/osteopenia H/o elevated lipids 3.9 cm aortic root aneurysm (last chesrt 12/19 and aneurysm was stable) COPD, followed by Dr. Warner Mccreedy PE 12/19, now off eliquis Parkinson's Possibly cystitis  P:   Mammogram guidelines reviewed.  Has appt scheduled in May pap smear not indicated Have called for pulmonology appt for pt Urine culture pending Macrobid 100mg  bid x 5 days Ca-125 obtained today return annually or prn

## 2020-03-22 NOTE — Progress Notes (Signed)
Per orders of Dr. [name], injection of B12 given by Pilar Grammes, CMA in right deltoid. Patient tolerated injection well.

## 2020-03-25 ENCOUNTER — Telehealth: Payer: Self-pay | Admitting: Internal Medicine

## 2020-03-25 ENCOUNTER — Ambulatory Visit: Payer: Medicare PPO | Admitting: Primary Care

## 2020-03-25 ENCOUNTER — Encounter: Payer: Self-pay | Admitting: Primary Care

## 2020-03-25 ENCOUNTER — Ambulatory Visit (INDEPENDENT_AMBULATORY_CARE_PROVIDER_SITE_OTHER): Payer: Medicare PPO | Admitting: Obstetrics & Gynecology

## 2020-03-25 ENCOUNTER — Other Ambulatory Visit: Payer: Self-pay

## 2020-03-25 ENCOUNTER — Encounter: Payer: Self-pay | Admitting: Obstetrics & Gynecology

## 2020-03-25 ENCOUNTER — Ambulatory Visit (INDEPENDENT_AMBULATORY_CARE_PROVIDER_SITE_OTHER): Payer: Medicare PPO

## 2020-03-25 VITALS — BP 154/84 | HR 70 | Temp 98.0°F | Ht 62.5 in | Wt 135.8 lb

## 2020-03-25 VITALS — BP 124/84 | HR 68 | Temp 97.0°F | Resp 16 | Ht 63.25 in | Wt 134.0 lb

## 2020-03-25 DIAGNOSIS — N390 Urinary tract infection, site not specified: Secondary | ICD-10-CM

## 2020-03-25 DIAGNOSIS — R82998 Other abnormal findings in urine: Secondary | ICD-10-CM | POA: Diagnosis not present

## 2020-03-25 DIAGNOSIS — R0600 Dyspnea, unspecified: Secondary | ICD-10-CM | POA: Diagnosis not present

## 2020-03-25 DIAGNOSIS — J449 Chronic obstructive pulmonary disease, unspecified: Secondary | ICD-10-CM | POA: Diagnosis not present

## 2020-03-25 DIAGNOSIS — C561 Malignant neoplasm of right ovary: Secondary | ICD-10-CM | POA: Diagnosis not present

## 2020-03-25 DIAGNOSIS — R0609 Other forms of dyspnea: Secondary | ICD-10-CM

## 2020-03-25 DIAGNOSIS — Z0000 Encounter for general adult medical examination without abnormal findings: Secondary | ICD-10-CM | POA: Diagnosis not present

## 2020-03-25 DIAGNOSIS — R918 Other nonspecific abnormal finding of lung field: Secondary | ICD-10-CM | POA: Diagnosis not present

## 2020-03-25 DIAGNOSIS — Z01419 Encounter for gynecological examination (general) (routine) without abnormal findings: Secondary | ICD-10-CM | POA: Diagnosis not present

## 2020-03-25 LAB — POCT URINALYSIS DIPSTICK
Bilirubin, UA: NEGATIVE
Glucose, UA: NEGATIVE
Ketones, UA: NEGATIVE
Nitrite, UA: NEGATIVE
Protein, UA: NEGATIVE
Urobilinogen, UA: NEGATIVE E.U./dL — AB
pH, UA: 5 (ref 5.0–8.0)

## 2020-03-25 MED ORDER — PREDNISONE 10 MG PO TABS: ORAL_TABLET | ORAL | 0 refills | Status: DC

## 2020-03-25 MED ORDER — NITROFURANTOIN MONOHYD MACRO 100 MG PO CAPS: 100.0000 mg | ORAL_CAPSULE | Freq: Two times a day (BID) | ORAL | 0 refills | Status: DC

## 2020-03-25 NOTE — Progress Notes (Signed)
Spoke with Ria Comment at Dr. Golden Pop office. Patient scheduled for OV with Eustaquio Maize, NP for today at 1600. Patient updated and agreeable to appointment.

## 2020-03-25 NOTE — Progress Notes (Signed)
Please let patient know her CXR did not show evidence of pneumonia. Stable nodularity right lung favoring scarring.

## 2020-03-25 NOTE — Telephone Encounter (Signed)
Spoke with [place] at [facility]. States that pt is currently in the office now seeing Dr. Sabra Heck. Pt is exhibiting more shortness of breath than usual. [place] states when the pt was brought back to her exam room the CMA noticed that the pt was having a hard with her breathing. Dr. Sabra Heck would the pt to be seen today if at all possible. Pt has been scheduled to see Beth this afternoon at 1600. Nothing further was needed.

## 2020-03-25 NOTE — Progress Notes (Signed)
@Patient  ID: Crystal Bass, female    DOB: January 01, 1941, 79 y.o.   MRN: [id number]  Chief Complaint  Patient presents with  . Acute Visit    SOB with exertion is getting worse    Referring provider: [name], MD  HPI: 79 year old female, never smoked. PMH significant for COPD with asthma, chronic dyspnea, chronic fatigue, [name], allergic rhinitis, HTN, aortic aneurysm, PE, breast cancer, dysphagia, parkinson disease. Patient of Dr. Chase Caller, last seen on 12/14/19 by pulmonary NP for telephone visit.   03/25/2020 Patient presents today for acute visit to evaluate dyspnea. She was at Dr. Sanjuan Dame office today and noted to seem more short of breath. Patient reports that she feels well and does not feel that her breathing is significantly worse than her baseline. States that it is not unusual for her to become short of breath with walking or with warm weather. She is using Trelegy Ellipta inhaler as prescribed. She has albuterol at home but believes it is out of date. She is unsure of when to use it. States that she tried to get by without using it. She notes some right sided pleuritic pain. She had some mucus production last week but reports no color. Some associated chest/upperairway tightness. She denies current cough or wheezing. She recently just stopped taking allergy shots.   Imaging: 10/28/2018-CTA chest-resolution of previously seen left upper lobe segmental PE, no new emboli seen, multiple nodular changes throughout the right lung stable from recent exam, these are consistent with given clinical history of [name]  Pulmonary function testing: 01/13/2019-pulmonary function test-FVC 1.81 (70% predicted), postbronchodilator ratio 77, postbronchodilator FEV1 1.48 (77% predicted), no bronchodilator response, DLCO 17.28 (96% predicted) >>>Patient did use Trelegy Ellipta prior to PFT  Allergies  Allergen Reactions  . Sulfonamide Derivatives Anaphylaxis  . Topamax [Topiramate] Other (See  Comments)    Metabolic acidosis   . Biaxin [Clarithromycin] Other (See Comments)    pericarditis  . Hydrocodone Other (See Comments)    "hyper and climbing the walls"  . Motrin [Ibuprofen] Other (See Comments)    headaches  . Symbicort [Budesonide-Formoterol Fumarate] Other (See Comments)    02/07/15 tremor  . Percocet [Oxycodone-Acetaminophen] Itching and Rash  . Tape Itching and Rash    Use paper tape only  . Tetracyclines & Related Other (See Comments)    "immediate yeast infection"    Immunization History  Administered Date(s) Administered  . Fluad Quad(high Dose 65+) 08/11/2019  . Influenza Split 09/11/2011  . Influenza Whole 09/09/2007, 08/08/2008, 08/15/2010, 08/26/2012  . Influenza,inj,Quad PF,6+ Mos 08/18/2013, 08/20/2014, 08/25/2018  . Influenza-Unspecified 09/27/2015, 08/26/2017  . Moderna SARS-COVID-2 Vaccination 01/01/2020, 01/20/2020  . Pneumococcal Conjugate-13 02/12/2014  . Pneumococcal Polysaccharide-23 11/26/2006, 10/21/2012  . Td 01/01/2011  . Zoster 05/26/2007  . Zoster Recombinat (Shingrix) 02/22/2018, 07/08/2019    Past Medical History:  Diagnosis Date  . 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 Aortic root aneurysm ([place])   . Arthritis   . Asthma   . Breast cancer ([place]) 2002   infiltrative ductal carcinoma   . Cataract 2019  . COPD (chronic obstructive pulmonary disease) ([place]) 9/08  . Ductal carcinoma of breast, estrogen receptor positive, stage 1 ([place]) 09/16/2012  . Endometriosis   . GERD (gastroesophageal reflux disease)   . Hypercalcemia 09/14/2013  . Hypertension   . Lesion of breast 1992   right, benign  . Lung disease    secondary to [name] infection  .  Mastalgia 7/95  . Osteoporosis, post-menopausal 03/14/2012  . Ovarian cancer ([place]) 2004  . Pancreatitis    secondary to Cholelithiasis  . Parkinson disease ([place]) 02/10/15  . Post-thoracotomy pain syndrome   . Pseudomonas pneumonia ([employer]) 5/09  . Uterine fibroid    . Vitamin D deficiency     Tobacco History: Social History   Tobacco Use  Smoking Status Never Smoker  Smokeless Tobacco Never Used   Counseling given: Not Answered   Outpatient Medications Prior to Visit  Medication Sig Dispense Refill  . acetaminophen (TYLENOL) 500 MG tablet Take 2 tablets (1,000 mg total) by mouth 2 (two) times daily as needed. (Patient taking differently: Take 1,000 mg by mouth 2 (two) times daily as needed for mild pain or moderate pain. )    . aspirin 81 MG tablet Take 1 tablet (81 mg total) by mouth daily. 30 tablet   . bisoprolol (ZEBETA) 5 MG tablet TAKE 1/2 TABLET BY MOUTH DAILY 45 tablet 3  . Carbidopa-Levodopa ER (SINEMET CR) 25-100 MG tablet controlled release TAKE 2 TABS IN THE AM, 1 IN AFTERNOON, 1 IN THE EVENING 360 tablet 1  . clonazePAM (KLONOPIN) 0.5 MG tablet TAKE 0.5 TABLETS (0.25 MG TOTAL) BY MOUTH AT BEDTIME. 45 tablet 1  . EPINEPHrine 0.3 mg/0.3 mL IJ SOAJ injection     . Fluticasone-Umeclidin-Vilant (TRELEGY ELLIPTA) 100-62.5-25 MCG/INH AEPB Inhale 1 puff into the lungs daily. 60 each 6  . gabapentin ([name]) 300 MG capsule TAKE 2 CAPSULES (600 MG TOTAL) BY MOUTH 3 (THREE) TIMES DAILY 540 capsule 1  . nitrofurantoin, macrocrystal-monohydrate, (MACROBID) 100 MG capsule Take 1 capsule (100 mg total) by mouth 2 (two) times daily. 10 capsule 0  . omeprazole (PRILOSEC) 20 MG capsule TAKE 1 CAPSULE BY MOUTH EVERY DAY 90 capsule 1  . VITAMIN D PO Take 50,000 Int'l Units by mouth once a week.     No facility-administered medications prior to visit.    Review of Systems  Review of Systems  Respiratory: Positive for chest tightness and shortness of breath. Negative for cough and wheezing.   Cardiovascular: Negative.     Physical Exam  BP (!) 154/84 (BP Location: Left Arm, Cuff Size: Normal)   Pulse 70   Temp 98 F (36.7 C) (Temporal)   Ht 5' 2.5" (1.588 m)   Wt 135 lb 12.8 oz (61.6 kg)   SpO2 100%   BMI 24.44 kg/m  Physical  Exam Constitutional:      General: She is not in acute distress. HENT:     Mouth/Throat:     Mouth: Mucous membranes are moist.     Pharynx: Oropharynx is clear.  Cardiovascular:     Rate and Rhythm: Normal rate and regular rhythm.  Pulmonary:     Breath sounds: Rales present.     Comments: Fine crackles right base, mild to moderate dyspnea with speaking Skin:    General: Skin is warm and dry.  Neurological:     General: No focal deficit present.     Mental Status: She is alert. Mental status is at baseline.  Psychiatric:        Mood and Affect: Mood normal.        Thought Content: Thought content normal.      Lab Results:  CBC    Component Value Date/Time   WBC 5.9 12/23/2018 1516   WBC 6.2 10/28/2018 1420   RBC 4.49 12/23/2018 1516   RBC 3.81 (L) 10/28/2018 1420   HGB 13.7 12/23/2018  1516   HGB 13.7 03/28/2017 1130   HCT 40.5 12/23/2018 1516   HCT 41.4 03/28/2017 1130   PLT 167 12/23/2018 1516   MCV 90 12/23/2018 1516   MCV 95.1 03/28/2017 1130   MCH 30.5 12/23/2018 1516   MCH 31.5 10/28/2018 1420   MCHC 33.8 12/23/2018 1516   MCHC 32.3 10/28/2018 1420   RDW 12.5 12/23/2018 1516   RDW 14.2 03/28/2017 1130   LYMPHSABS 1.0 10/01/2018 1313   LYMPHSABS 1.3 03/28/2017 1130   MONOABS 0.7 10/01/2018 1313   MONOABS 0.7 03/28/2017 1130   EOSABS 0.2 10/01/2018 1313   EOSABS 0.2 03/28/2017 1130   BASOSABS 0.1 10/01/2018 1313   BASOSABS 0.1 03/28/2017 1130    BMET    Component Value Date/Time   NA 140 01/22/2020 1259   NA 141 12/23/2018 1514   NA 141 03/28/2017 1130   K 3.8 01/22/2020 1259   K 4.2 03/28/2017 1130   CL 104 01/22/2020 1259   CL 102 03/10/2013 1529   CO2 31 01/22/2020 1259   CO2 28 03/28/2017 1130   GLUCOSE 100 (H) 01/22/2020 1259   GLUCOSE 75 03/28/2017 1130   GLUCOSE 98 03/10/2013 1529   BUN 14 01/22/2020 1259   BUN 16 12/23/2018 1514   BUN 17.3 03/28/2017 1130   CREATININE 0.79 01/22/2020 1259   CREATININE 0.8 03/28/2017 1130   CALCIUM  9.5 01/22/2020 1259   CALCIUM 9.6 03/28/2017 1130   GFRNONAA 57 (L) 12/23/2018 1514   GFRAA 66 12/23/2018 1514    BNP No results found for: BNP  ProBNP    Component Value Date/Time   PROBNP 84.0 03/28/2011 1232    Imaging: DG Chest 2 View  Result Date: 03/25/2020 CLINICAL DATA:  Dyspnea EXAM: CHEST - 2 VIEW COMPARISON:  Multiple exams, including chest radiograph from 10/28/2018 FINDINGS: Stable vague nodularity at the right lung apex noted along with chronic interstitial accentuation and mild cardiomegaly. The patient is rotated to the right on today's radiograph, reducing diagnostic sensitivity and specificity. Left axillary clips are noted. No blunting of the costophrenic angles. Thoracic kyphosis. Bony demineralization. Levoconvex upper thoracic scoliosis. IMPRESSION: 1. Mild cardiomegaly. 2. Stable vague nodularity at the right lung apex compatible favoring scarring. 3. Bony demineralization. 4. Levoconvex upper thoracic scoliosis. Electronically Signed   By: Van Clines M.D.   On: 03/25/2020 17:07     Assessment & Plan:   Chronic obstructive airway disease with asthma ([place]) - Exacerbation of dyspnea likely d/t acute urinary tract infection. No cough or purulent mucus production. Holding off on addition abx. Will treat with oral steriod and encourage bronchodilator use  - CXR showed chronic scarring right apical lung base, no acute cardiopulmonary process - Continue Trelegy 1 puff daily - Refill Albuterol 2 puffs q6 hours for shortness of breath or wheezing  - RX prednisone taper  - FU in 1-2 weeks with APP  UTI (urinary tract infection) - She is currently being treating for possible UTI with macrobid by Dr. Sabra Heck prescribed today    Martyn Ehrich, NP 03/26/2020

## 2020-03-25 NOTE — Patient Instructions (Addendum)
Recommendations: Drinks plenty of fluids Continue Trelegy 1 puff daily Use albuterol rescue inhaler 2 puffs every 6 hours for shortness of breath   Orders: CXR today to rule out pneumonia (right pleuritic pain)  Rx: Prednisone taper for COPD exacerbation  Take Macrobid per Dr. Sabra Heck   Follow-up: 1-2 weeks with App

## 2020-03-26 DIAGNOSIS — N390 Urinary tract infection, site not specified: Secondary | ICD-10-CM | POA: Insufficient documentation

## 2020-03-26 LAB — CA 125: Cancer Antigen (CA) 125: 31.5 U/mL (ref 0.0–38.1)

## 2020-03-26 MED ORDER — ALBUTEROL SULFATE HFA 108 (90 BASE) MCG/ACT IN AERS
2.0000 | INHALATION_SPRAY | Freq: Four times a day (QID) | RESPIRATORY_TRACT | 1 refills | Status: DC | PRN
Start: 2020-03-26 — End: 2022-06-04

## 2020-03-26 NOTE — Assessment & Plan Note (Addendum)
-   Exacerbation of dyspnea likely d/t acute urinary tract infection. No cough or purulent mucus production. Holding off on addition abx. Will treat with oral steriod and encourage bronchodilator use  - CXR showed chronic scarring right apical lung base, no acute cardiopulmonary process - Continue Trelegy 1 puff daily - Refill Albuterol 2 puffs q6 hours for shortness of breath or wheezing  - RX prednisone taper  - FU in 1-2 weeks with APP

## 2020-03-26 NOTE — Assessment & Plan Note (Signed)
-   She is currently being treating for possible UTI with macrobid by Dr. Sabra Heck prescribed today

## 2020-03-27 LAB — URINE CULTURE

## 2020-03-27 NOTE — Addendum Note (Signed)
Addended by: Megan Salon on: 03/27/2020 08:07 PM   Modules accepted: Orders

## 2020-03-28 ENCOUNTER — Telehealth: Payer: Self-pay | Admitting: Primary Care

## 2020-03-28 ENCOUNTER — Ambulatory Visit: Payer: Medicare PPO | Admitting: Internal Medicine

## 2020-03-28 ENCOUNTER — Telehealth: Payer: Self-pay

## 2020-03-28 NOTE — Telephone Encounter (Signed)
Left message for call back.

## 2020-03-28 NOTE — Telephone Encounter (Signed)
Advised pt of results. Pt understood and nothing further is needed.      Martyn Ehrich, NP  03/25/2020 5:18 PM EDT    Please let patient know her CXR did not show evidence of pneumonia. Stable nodularity right lung favoring scarring.

## 2020-03-28 NOTE — Telephone Encounter (Signed)
-----   Message from Megan Salon, MD sent at 03/27/2020  8:06 PM EDT ----- Please let pt know her urine culture was ultimately negative.  Ok to fininsh her antibiotics.  Also, let her know I'm glad she went to pulmonology on Friday afternoon.  Her ca 125 is 3.1 so right in the range where it has been.  Recheck 6 months, lab only.  Order placed.

## 2020-03-28 NOTE — Telephone Encounter (Signed)
Patient notified of results. See lab 

## 2020-03-30 NOTE — Progress Notes (Signed)
Assessment/Plan:   1.  Parkinsons Disease  -Continue carbidopa/levodopa 25/100 CR, 2/1/1.  Changing from IR did not change hypersomnolence, but I did not change back because of orthostasis.  -referral to PT/ST 2.EDS -don't think that related to PD             -this is the biggest c/o.    Gabapentin likely is playing a role (although BP certainly an issue), but PCP attempting to lower 3. Long hx migraine -No longer a candidate for triptan therapy. If headaches resume, CGRP inhibitors could be an option now.  She has not had migraine for quite some time now 4. Orthostatic hypotension, with history of associated syncope -Refuses compression binder. -was RX northera by cardiology but pt didn't take.  Really think that she needs this  -BP very low today 5. REM behavior disorder -Continue clonazepam 0.5 mg, half tablet at bedtime. 6. Mild cognitive impairment -Last neurocognitive testing was in September, 2018.  I have not seen significant change since that time.    Subjective:   Crystal Bass was seen today in follow up for Parkinsons disease.  My previous records were reviewed prior to todays visit as well as outside records available to me. Pt denies falls.  In regards to her history of dizziness and even syncope, she had previously been given Northera by cardiology but never ended up taking that.  BP very low in the office today.  She states today that "its a lot lower than that this morning."  No dizziness today but does feel weak.   Pt states that "it doesn't go low that often - only when I have issues with my bowels."  no hallucinations.  Doesn't think that walking as well  Current prescribed movement disorder medications: Carbidopa/levodopa 25/100 CR, 2 tablets in the morning, 1 in the afternoon and 1 in the evening Clonazepam 0.5 mg, half tablet at night   PREVIOUS MEDICATIONS: Sinemet (changed  from IR due to EDS but not sure it really made a difference); northera (RX by cardiology but not taken)  ALLERGIES:   Allergies  Allergen Reactions  . Sulfonamide Derivatives Anaphylaxis  . Topamax [Topiramate] Other (See Comments)    Metabolic acidosis   . Biaxin [Clarithromycin] Other (See Comments)    pericarditis  . Hydrocodone Other (See Comments)    "hyper and climbing the walls"  . Motrin [Ibuprofen] Other (See Comments)    headaches  . Symbicort [Budesonide-Formoterol Fumarate] Other (See Comments)    02/07/15 tremor  . Percocet [Oxycodone-Acetaminophen] Itching and Rash  . Tape Itching and Rash    Use paper tape only  . Tetracyclines & Related Other (See Comments)    "immediate yeast infection"    CURRENT MEDICATIONS:  Outpatient Encounter Medications as of 04/01/2020  Medication Sig  . acetaminophen (TYLENOL) 500 MG tablet Take 2 tablets (1,000 mg total) by mouth 2 (two) times daily as needed. (Patient taking differently: Take 1,000 mg by mouth 2 (two) times daily as needed for mild pain or moderate pain. )  . albuterol (VENTOLIN HFA) 108 (90 Base) MCG/ACT inhaler Inhale 2 puffs into the lungs every 6 (six) hours as needed for wheezing or shortness of breath.  [name] aspirin 81 MG tablet Take 1 tablet (81 mg total) by mouth daily.  . bisoprolol (ZEBETA) 5 MG tablet TAKE 1/2 TABLET BY MOUTH DAILY  . Carbidopa-Levodopa ER (SINEMET CR) 25-100 MG tablet controlled release TAKE 2 TABS IN THE AM, 1 IN AFTERNOON, 1 IN  THE EVENING  . clonazePAM (KLONOPIN) 0.5 MG tablet TAKE 0.5 TABLETS (0.25 MG TOTAL) BY MOUTH AT BEDTIME.  [name] EPINEPHrine 0.3 mg/0.3 mL IJ SOAJ injection   . Fluticasone-Umeclidin-Vilant (TRELEGY ELLIPTA) 100-62.5-25 MCG/INH AEPB Inhale 1 puff into the lungs daily.  [name] gabapentin ([name]) 300 MG capsule TAKE 2 CAPSULES (600 MG TOTAL) BY MOUTH 3 (THREE) TIMES DAILY (Patient taking differently: Take 600 mg by mouth as directed. Pt takes 2 tabs in the morning , 1 in the  afternoon and 2 in the evening)  . omeprazole (PRILOSEC) 20 MG capsule TAKE 1 CAPSULE BY MOUTH EVERY DAY  . predniSONE (DELTASONE) 10 MG tablet Take 4 tabs po daily x 2 days; then 3 tabs for 2 days; then 2 tabs for 2 days; then 1 tab for 2 days  . VITAMIN D PO Take 50,000 Int'l Units by mouth once a week.  . [DISCONTINUED] nitrofurantoin, macrocrystal-monohydrate, (MACROBID) 100 MG capsule Take 1 capsule (100 mg total) by mouth 2 (two) times daily. (Patient not taking: Reported on 04/01/2020)   No facility-administered encounter medications on file as of 04/01/2020.    Objective:   PHYSICAL EXAMINATION:    VITALS:   Vitals:   04/01/20 1112  BP: (!) 92/55  Pulse: (!) 56  SpO2: 98%  Weight: 135 lb (61.2 kg)  Height: 5\' 2"  (1.575 m)    GEN:  The patient appears stated age and is in NAD. HEENT:  Normocephalic, atraumatic.  The mucous membranes are moist. The superficial temporal arteries are without ropiness or tenderness. CV:  RRR Lungs:  CTAB Neck/HEME:  There are no carotid bruits bilaterally.  Neurological examination:  Orientation: The patient is alert and oriented x3. Cranial nerves: There is good facial symmetry without facial hypomimia. The speech is fluent and clear. Soft palate rises symmetrically and there is no tongue deviation. Hearing is intact to conversational tone. Sensation: Sensation is intact to light touch throughout Motor: Strength is at least antigravity x4.  Movement examination: Tone: There is mild increased tone in the RUE Abnormal movements: none Coordination:  There is no decremation with RAM's, Gait and Station: The patient has no difficulty arising out of a deep-seated chair without the use of the hands. The patient's stride length is good but she is tenuous.    I have reviewed and interpreted the following labs independently    Chemistry      Component Value Date/Time   NA 140 01/22/2020 1259   NA 141 12/23/2018 1514   NA 141 03/28/2017 1130    K 3.8 01/22/2020 1259   K 4.2 03/28/2017 1130   CL 104 01/22/2020 1259   CL 102 03/10/2013 1529   CO2 31 01/22/2020 1259   CO2 28 03/28/2017 1130   BUN 14 01/22/2020 1259   BUN 16 12/23/2018 1514   BUN 17.3 03/28/2017 1130   CREATININE 0.79 01/22/2020 1259   CREATININE 0.8 03/28/2017 1130      Component Value Date/Time   CALCIUM 9.5 01/22/2020 1259   CALCIUM 9.6 03/28/2017 1130   ALKPHOS 81 01/22/2020 1259   ALKPHOS 85 03/28/2017 1130   AST 12 01/22/2020 1259   AST 12 03/28/2017 1130   ALT 1 01/22/2020 1259   ALT <6 03/28/2017 1130   BILITOT 1.4 (H) 01/22/2020 1259   BILITOT 1.38 (H) 03/28/2017 1130       Lab Results  Component Value Date   WBC 5.9 12/23/2018   HGB 13.7 12/23/2018   HCT 40.5 12/23/2018   MCV  90 12/23/2018   PLT 167 12/23/2018    Lab Results  Component Value Date   TSH 1.893 10/26/2018     Total time spent on today's visit was 20 minutes, including both face-to-face time and nonface-to-face time.  Time included that spent on review of records (prior notes available to me/labs/imaging if pertinent), discussing treatment and goals, answering patient's questions and coordinating care.  Cc:  [name], MD

## 2020-04-01 ENCOUNTER — Ambulatory Visit (INDEPENDENT_AMBULATORY_CARE_PROVIDER_SITE_OTHER): Payer: Medicare PPO | Admitting: Neurology

## 2020-04-01 ENCOUNTER — Encounter: Payer: Self-pay | Admitting: Neurology

## 2020-04-01 ENCOUNTER — Other Ambulatory Visit: Payer: Self-pay

## 2020-04-01 VITALS — BP 92/55 | HR 56 | Ht 62.0 in | Wt 135.0 lb

## 2020-04-01 DIAGNOSIS — G20 Parkinson's disease: Secondary | ICD-10-CM | POA: Diagnosis not present

## 2020-04-01 DIAGNOSIS — G4710 Hypersomnia, unspecified: Secondary | ICD-10-CM

## 2020-04-01 DIAGNOSIS — G903 Multi-system degeneration of the autonomic nervous system: Secondary | ICD-10-CM

## 2020-04-01 NOTE — Patient Instructions (Signed)
You have been referred to [facility] for therapy. They will call you directly to schedule an appointment.  Please call [phone] if you do not hear from them.   The physicians and staff at [facility] are committed to providing excellent care. You may receive a survey requesting feedback about your experience at our office. We strive to receive "very good" responses to the survey questions. If you feel that your experience would prevent you from giving the office a "very good " response, please contact our office to try to remedy the situation. We may be reached at [phone]. Thank you for taking the time out of your busy day to complete the survey.

## 2020-04-04 ENCOUNTER — Ambulatory Visit
Admission: RE | Admit: 2020-04-04 | Discharge: 2020-04-04 | Disposition: A | Discharge status: Home / Self Care | Payer: Medicare PPO | Source: Ambulatory Visit | Attending: Obstetrics & Gynecology | Admitting: Obstetrics & Gynecology

## 2020-04-04 ENCOUNTER — Other Ambulatory Visit: Payer: Self-pay

## 2020-04-04 ENCOUNTER — Ambulatory Visit
Admission: RE | Admit: 2020-04-04 | Discharge: 2020-04-04 | Disposition: A | Discharge status: Home / Self Care | Payer: Medicare PPO | Source: Ambulatory Visit | Attending: Family Medicine | Admitting: Family Medicine

## 2020-04-04 DIAGNOSIS — M810 Age-related osteoporosis without current pathological fracture: Secondary | ICD-10-CM | POA: Diagnosis not present

## 2020-04-04 DIAGNOSIS — Z1231 Encounter for screening mammogram for malignant neoplasm of breast: Secondary | ICD-10-CM | POA: Diagnosis not present

## 2020-04-04 DIAGNOSIS — Z780 Asymptomatic menopausal state: Secondary | ICD-10-CM | POA: Diagnosis not present

## 2020-04-08 ENCOUNTER — Other Ambulatory Visit: Payer: Self-pay

## 2020-04-08 ENCOUNTER — Ambulatory Visit: Payer: Medicare PPO | Admitting: Primary Care

## 2020-04-08 ENCOUNTER — Encounter: Payer: Self-pay | Admitting: Primary Care

## 2020-04-08 DIAGNOSIS — J449 Chronic obstructive pulmonary disease, unspecified: Secondary | ICD-10-CM

## 2020-04-08 DIAGNOSIS — I341 Nonrheumatic mitral (valve) prolapse: Secondary | ICD-10-CM | POA: Insufficient documentation

## 2020-04-08 DIAGNOSIS — I059 Rheumatic mitral valve disease, unspecified: Secondary | ICD-10-CM | POA: Insufficient documentation

## 2020-04-08 DIAGNOSIS — R0609 Other forms of dyspnea: Secondary | ICD-10-CM

## 2020-04-08 MED ORDER — SACCHAROMYCES BOULARDII 250 MG PO CAPS
250.0000 mg | ORAL_CAPSULE | Freq: Two times a day (BID) | ORAL | 0 refills | Status: DC
Start: 2020-04-08 — End: 2020-04-19

## 2020-04-08 MED ORDER — CEFDINIR 300 MG PO CAPS: 300.0000 mg | ORAL_CAPSULE | Freq: Two times a day (BID) | ORAL | 0 refills | Status: DC

## 2020-04-08 NOTE — Assessment & Plan Note (Addendum)
-   Progressive dyspnea over the last year and a half - Treating bronchitis symptoms with course of omnicef 1 tab twice daily; sending in probiotic to take with antibiotic to prevent yeast infection  - Continue Trelegy 100 one puff daily; prn albuterol 2 puffs every 4-6 hours for sob/wheezing - Encourage patient use IS 5-10 breaths/hour 2-3 times a day  - Follow-up in 3 months

## 2020-04-08 NOTE — Patient Instructions (Signed)
  Treating bronchitis with Omnicef 1 tab twice daily x7 days; only we are sending in probiotic that you will take twice a day for 10 days while on antibiotic to prevent yeast infection  Recommendations: Continue Trelegy Ellipta 1 puff daily Use incentive spirometer 5 to 10 breaths an hour 2- 3 times a day  Follow-up: 3 months with Dr. Chase Caller Recommend that you contact cardiology regarding dyspnea and history of mitral valve prolapse/regurgitation

## 2020-04-08 NOTE — Progress Notes (Signed)
@Patient  ID: Crystal Bass, female    DOB: February 10, 1941, 79 y.o.   MRN: [id number]  Chief Complaint  Patient presents with  . Follow-up    pt is states sob when doing acitivies    Referring provider: [name], MD  HPI:  79 year old female, never smoked. PMH significant for COPD with asthma, chronic dyspnea, chronic fatigue, [name], allergic rhinitis, HTN, aortic aneurysm, PE, breast cancer, dysphagia, parkinson disease. Patient of Dr. Chase Caller, last seen on 12/14/19 by pulmonary NP for telephone visit.   [facility] pulmonary encounters: 03/25/2020 Patient presents today for acute visit to evaluate dyspnea. She was at Dr. Sanjuan Dame office today and noted to seem more short of breath. Patient reports that she feels well and does not feel that her breathing is significantly worse than her baseline. States that it is not unusual for her to become short of breath with walking or with warm weather. She is using Trelegy Ellipta inhaler as prescribed. She has albuterol at home but believes it is out of date. She is unsure of when to use it. States that she tried to get by without using it. She notes some right sided pleuritic pain. She had some mucus production last week but reports no color. Some associated chest/upperairway tightness. She denies current cough or wheezing. She recently just stopped taking allergy shots.   04/08/2020 Patient presents today for 2 week follow-up. Feels her breathing probably has declined over the last year or two but she tends to ingore it. States that other people see it but that it seems normal to her. Her breathing does limit the things that she can do. No noticeable improvement from recent prednisone taper. She has not been using Albuterol because of her osteoporosis and she does not see that it makes a big difference.  She does have a little productive cough with some pea size green mucus. She has not noticed any wheezing. She has had some chest pain. She has a history  of mitral valve prolapse, last echocardiogram was in 2019. Her urinary symptoms have resolved, she finished bactrim.   Pulmonary testing: 10/28/2018-CTA chest-resolution of previously seen left upper lobe segmental PE, no new emboli seen, multiple nodular changes throughout the right lung stable from recent exam, these are consistent with given clinical history of [name]  03/25/20 CXR: Mild cardiomegaly. Stable vague nodularity at the right lung apex compatible favoring scarring. Bony demineralization. Levoconvex upper thoracic scoliosis.  01/13/2019-pulmonary function test-FVC 1.81 (70% predicted), postbronchodilator ratio 77, postbronchodilator FEV1 1.48 (77% predicted), no bronchodilator response, DLCO 17.28 (96% predicted) >>>Patient did use Trelegy Ellipta prior to PFT  Cardiac testing: 09/20/19 Echocardiogram- EF 60-65%, moderate mitral valve prolapse with moderate regurgitation, grade 1 diastolic dysfunction, Pulmonary arteries systolic pressure was within the normal  range.    Allergies  Allergen Reactions  . Sulfonamide Derivatives Anaphylaxis  . Topamax [Topiramate] Other (See Comments)    Metabolic acidosis   . Biaxin [Clarithromycin] Other (See Comments)    pericarditis  . Hydrocodone Other (See Comments)    "hyper and climbing the walls"  . Motrin [Ibuprofen] Other (See Comments)    headaches  . Symbicort [Budesonide-Formoterol Fumarate] Other (See Comments)    02/07/15 tremor  . Percocet [Oxycodone-Acetaminophen] Itching and Rash  . Tape Itching and Rash    Use paper tape only  . Tetracyclines & Related Other (See Comments)    "immediate yeast infection"    Immunization History  Administered Date(s) Administered  . Fluad Quad(high Dose 65+)  08/11/2019  . Influenza Split 09/11/2011  . Influenza Whole 09/09/2007, 08/08/2008, 08/15/2010, 08/26/2012  . Influenza,inj,Quad PF,6+ Mos 08/18/2013, 08/20/2014, 08/25/2018  . Influenza-Unspecified 09/27/2015, 08/26/2017  .  Moderna SARS-COVID-2 Vaccination 01/01/2020, 01/20/2020  . Pneumococcal Conjugate-13 02/12/2014  . Pneumococcal Polysaccharide-23 11/26/2006, 10/21/2012  . Td 01/01/2011  . Zoster 05/26/2007  . Zoster Recombinat (Shingrix) 02/22/2018, 07/08/2019    Past Medical History:  Diagnosis Date  . 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 Aortic root aneurysm ([place])   . Arthritis   . Asthma   . Breast cancer ([place]) 2002   infiltrative ductal carcinoma   . Cataract 2019  . COPD (chronic obstructive pulmonary disease) ([place]) 9/08  . Ductal carcinoma of breast, estrogen receptor positive, stage 1 ([place]) 09/16/2012  . Endometriosis   . GERD (gastroesophageal reflux disease)   . Hypercalcemia 09/14/2013  . Hypertension   . Lesion of breast 1992   right, benign  . Lung disease    secondary to [name] infection  . Mastalgia 7/95  . Osteoporosis, post-menopausal 03/14/2012  . Ovarian cancer ([place]) 2004  . Pancreatitis    secondary to Cholelithiasis  . Parkinson disease ([place]) 02/10/15  . Post-thoracotomy pain syndrome   . Pseudomonas pneumonia ([place]) 5/09  . Uterine fibroid   . Vitamin D deficiency     Tobacco History: Social History   Tobacco Use  Smoking Status Never Smoker  Smokeless Tobacco Never Used   Counseling given: Not Answered   Outpatient Medications Prior to Visit  Medication Sig Dispense Refill  . acetaminophen (TYLENOL) 500 MG tablet Take 2 tablets (1,000 mg total) by mouth 2 (two) times daily as needed. (Patient taking differently: Take 1,000 mg by mouth 2 (two) times daily as needed for mild pain or moderate pain. )    . albuterol (VENTOLIN HFA) 108 (90 Base) MCG/ACT inhaler Inhale 2 puffs into the lungs every 6 (six) hours as needed for wheezing or shortness of breath. 18 g 1  . aspirin 81 MG tablet Take 1 tablet (81 mg total) by mouth daily. 30 tablet   . bisoprolol (ZEBETA) 5 MG tablet TAKE 1/2 TABLET BY MOUTH DAILY 45 tablet 3  .  Carbidopa-Levodopa ER (SINEMET CR) 25-100 MG tablet controlled release TAKE 2 TABS IN THE AM, 1 IN AFTERNOON, 1 IN THE EVENING 360 tablet 1  . clonazePAM (KLONOPIN) 0.5 MG tablet TAKE 0.5 TABLETS (0.25 MG TOTAL) BY MOUTH AT BEDTIME. 45 tablet 1  . EPINEPHrine 0.3 mg/0.3 mL IJ SOAJ injection     . Fluticasone-Umeclidin-Vilant (TRELEGY ELLIPTA) 100-62.5-25 MCG/INH AEPB Inhale 1 puff into the lungs daily. 60 each 6  . gabapentin ([name]) 300 MG capsule TAKE 2 CAPSULES (600 MG TOTAL) BY MOUTH 3 (THREE) TIMES DAILY (Patient taking differently: Take 600 mg by mouth as directed. Pt takes 2 tabs in the morning , 1 in the afternoon and 2 in the evening) 540 capsule 1  . omeprazole (PRILOSEC) 20 MG capsule TAKE 1 CAPSULE BY MOUTH EVERY DAY 90 capsule 1  . VITAMIN D PO Take 50,000 Int'l Units by mouth once a week.    . predniSONE (DELTASONE) 10 MG tablet Take 4 tabs po daily x 2 days; then 3 tabs for 2 days; then 2 tabs for 2 days; then 1 tab for 2 days 20 tablet 0   No facility-administered medications prior to visit.    Review of Systems  Review of Systems  Constitutional: Negative.  Respiratory: Positive for cough and shortness of breath. Negative for chest tightness and wheezing.   Cardiovascular: Negative.    Physical Exam  BP 124/78 (BP Location: Right Arm, Cuff Size: Normal)   Pulse 89   Temp 98 F (36.7 C) (Temporal)   Ht 5\' 2"  (1.575 m)   Wt 135 lb 6.4 oz (61.4 kg)   SpO2 98%   BMI 24.76 kg/m  Physical Exam Constitutional:      Appearance: Normal appearance.  Cardiovascular:     Rate and Rhythm: Normal rate and regular rhythm.     Comments:  +trace -1BLE edema Pulmonary:     Effort: Pulmonary effort is normal.     Comments: Wheeze Right upper lobe improved with deep breathing  Neurological:     Mental Status: She is alert.  Psychiatric:        Mood and Affect: Mood normal.        Behavior: Behavior normal.        Thought Content: Thought content normal.         Judgment: Judgment normal.      Lab Results:  CBC    Component Value Date/Time   WBC 5.9 12/23/2018 1516   WBC 6.2 10/28/2018 1420   RBC 4.49 12/23/2018 1516   RBC 3.81 (L) 10/28/2018 1420   HGB 13.7 12/23/2018 1516   HGB 13.7 03/28/2017 1130   HCT 40.5 12/23/2018 1516   HCT 41.4 03/28/2017 1130   PLT 167 12/23/2018 1516   MCV 90 12/23/2018 1516   MCV 95.1 03/28/2017 1130   MCH 30.5 12/23/2018 1516   MCH 31.5 10/28/2018 1420   MCHC 33.8 12/23/2018 1516   MCHC 32.3 10/28/2018 1420   RDW 12.5 12/23/2018 1516   RDW 14.2 03/28/2017 1130   LYMPHSABS 1.0 10/01/2018 1313   LYMPHSABS 1.3 03/28/2017 1130   MONOABS 0.7 10/01/2018 1313   MONOABS 0.7 03/28/2017 1130   EOSABS 0.2 10/01/2018 1313   EOSABS 0.2 03/28/2017 1130   BASOSABS 0.1 10/01/2018 1313   BASOSABS 0.1 03/28/2017 1130    BMET    Component Value Date/Time   NA 140 01/22/2020 1259   NA 141 12/23/2018 1514   NA 141 03/28/2017 1130   K 3.8 01/22/2020 1259   K 4.2 03/28/2017 1130   CL 104 01/22/2020 1259   CL 102 03/10/2013 1529   CO2 31 01/22/2020 1259   CO2 28 03/28/2017 1130   GLUCOSE 100 (H) 01/22/2020 1259   GLUCOSE 75 03/28/2017 1130   GLUCOSE 98 03/10/2013 1529   BUN 14 01/22/2020 1259   BUN 16 12/23/2018 1514   BUN 17.3 03/28/2017 1130   CREATININE 0.79 01/22/2020 1259   CREATININE 0.8 03/28/2017 1130   CALCIUM 9.5 01/22/2020 1259   CALCIUM 9.6 03/28/2017 1130   GFRNONAA 57 (L) 12/23/2018 1514   GFRAA 66 12/23/2018 1514    BNP No results found for: BNP  ProBNP    Component Value Date/Time   PROBNP 84.0 03/28/2011 1232    Imaging: DG Chest 2 View  Result Date: 03/25/2020 CLINICAL DATA:  Dyspnea EXAM: CHEST - 2 VIEW COMPARISON:  Multiple exams, including chest radiograph from 10/28/2018 FINDINGS: Stable vague nodularity at the right lung apex noted along with chronic interstitial accentuation and mild cardiomegaly. The patient is rotated to the right on today's radiograph, reducing  diagnostic sensitivity and specificity. Left axillary clips are noted. No blunting of the costophrenic angles. Thoracic kyphosis. Bony demineralization. Levoconvex upper thoracic scoliosis. IMPRESSION: 1. Mild cardiomegaly.  2. Stable vague nodularity at the right lung apex compatible favoring scarring. 3. Bony demineralization. 4. Levoconvex upper thoracic scoliosis. Electronically Signed   By: Van Clines M.D.   On: 03/25/2020 17:07   DG Bone Density  Result Date: 04/04/2020 EXAM: DUAL X-RAY ABSORPTIOMETRY (DXA) FOR BONE MINERAL DENSITY IMPRESSION: Referring Physician:  [name] Your patient completed a BMD test using Lunar IDXA DXA system ( analysis version: 16 ) manufactured by [employer]. Technologist: AW PATIENT: Name: Wallie, Rockey Patient ID: [id number] Birth Date: Apr 02, 1941 Height: 62.0 in. Sex: Female Measured: 04/04/2020 Weight: 135.0 lbs. Indications: Advanced Age, Bilateral Ovariectomy (65.51), Breast Cancer History, Caucasian, COPD, Estrogen Deficient, Gabapentin, Height Loss (781.91), History of Osteoporosis, Hysterectomy, Klonopin, Low Calcium Intake (269.3), Omeprazole, Osteoporosis (733), Postmenopausal, Vitamin D Deficient Fractures: Patella Treatments: Vitamin D (E933.5), zometa ASSESSMENT: The BMD measured at Femur Neck Left is 0.678 g/cm2 with a T-score of -2.6. This patient is considered osteoporotic according to [facility]) criteria. The scan quality is good. L-4 was excluded due to degenerative changes. Site Region Measured Date Measured Age YA BMD Significant CHANGE T-score AP Spine L1-L3 04/04/2020 77.0 -2.7 0.880 g/cm2 * AP Spine  L1-L3      02/08/2016    72.9         -1.6    0.981 g/cm2 DualFemur Neck Left 04/04/2020 77.0 -2.6 0.678 g/cm2 * DualFemur Neck Left  02/08/2016    72.9         -2.0    0.764 g/cm2 DualFemur Total Mean 04/04/2020 77.0 -2.3 0.713 g/cm2 * DualFemur Total Mean 02/08/2016    72.9         -1.6    0.806 g/cm2 World Health  Organization [facility]) criteria for post-menopausal, Caucasian Women: Normal       T-score at or above -1 SD Osteopenia   T-score between -1 and -2.5 SD Osteoporosis T-score at or below -2.5 SD RECOMMENDATION: 1. All patients should optimize calcium and vitamin D intake. 2. Consider FDA approved medical therapies in postmenopausal women and men aged 75 years and older, based on the following: a. A hip or vertebral (clinical or morphometric) fracture b. T- score < or = -2.5 at the femoral neck or spine after appropriate evaluation to exclude secondary causes c. Low bone mass (T-score between -1.0 and -2.5 at the femoral neck or spine) and a 10 year probability of a hip fracture > or = 3% or a 10 year probability of a major osteoporosis-related fracture > or = 20% based on the [place]-adapted WHO algorithm d. Clinician judgment and/or patient preferences may indicate treatment for people with 10-year fracture probabilities above or below these levels FOLLOW-UP: Patients with diagnosis of osteoporosis or at high risk for fracture should have regular bone mineral density tests. For patients eligible for Medicare, routine testing is allowed once every 2 years. The testing frequency can be increased to one year for patients who have rapidly progressing disease, those who are receiving or discontinuing medical therapy to restore bone mass, or have additional risk factors. I have reviewed this report and agree with the above findings. [facility] Electronically Signed   By: Lowella Grip III M.D.   On: 04/04/2020 12:27   MM 3D SCREEN BREAST UNI RIGHT  Result Date: 04/05/2020 CLINICAL DATA:  Screening. EXAM: DIGITAL SCREENING UNILATERAL RIGHT MAMMOGRAM WITH CAD AND TOMO COMPARISON:  Previous exam(s). ACR Breast Density Category c: The breast tissue is heterogeneously dense, which may obscure small masses. FINDINGS: The  patient has had a left mastectomy. There are no findings suspicious for malignancy. Images were  processed with CAD. IMPRESSION: No mammographic evidence of malignancy. A result letter of this screening mammogram will be mailed directly to the patient. RECOMMENDATION: Screening mammogram in one year.  (Code:SM-R-2M) BI-RADS CATEGORY  1: Negative. Electronically Signed   By: Claudie Revering M.D.   On: 04/05/2020 17:48     Assessment & Plan:   Chronic obstructive airway disease with asthma (HCC) - Progressive dyspnea over the last year and a half - Treating bronchitis symptoms with course of omnicef 1 tab twice daily; sending in probiotic to take with antibiotic to prevent yeast infection  - Continue Trelegy 100 one puff daily; prn albuterol 2 puffs every 4-6 hours for sob/wheezing - Encourage patient use IS 5-10 breaths/hour 2-3 times a day  - Follow-up in 3 months    Chronic dyspnea - Moderate dyspnea with intermittent chest pain, unclear if 100% related to COPD/asthma. She did not improve with recent prednisone taper and has no audible wheezing on exam. - Echocardiogram in 2019 showed moderate mitral valve prolapse with regurgitation, grade 1 DD - Recommend patient follow-up with cardiology for evaluation    Martyn Ehrich, NP 04/08/2020

## 2020-04-08 NOTE — Assessment & Plan Note (Addendum)
-   Moderate dyspnea with intermittent chest pain, unclear if 100% related to COPD/asthma. She did not improve with recent prednisone taper and has no audible wheezing on exam. - Echocardiogram in 2019 showed moderate mitral valve prolapse with regurgitation, grade 1 DD - Recommend patient follow-up with cardiology for evaluation

## 2020-04-19 ENCOUNTER — Ambulatory Visit: Payer: Medicare Other

## 2020-04-19 ENCOUNTER — Ambulatory Visit: Payer: Medicare PPO | Admitting: Family Medicine

## 2020-04-19 ENCOUNTER — Encounter: Payer: Self-pay | Admitting: Family Medicine

## 2020-04-19 ENCOUNTER — Other Ambulatory Visit: Payer: Self-pay

## 2020-04-19 VITALS — BP 140/82 | HR 62 | Temp 97.8°F | Ht 62.0 in | Wt 135.5 lb

## 2020-04-19 DIAGNOSIS — M810 Age-related osteoporosis without current pathological fracture: Secondary | ICD-10-CM | POA: Diagnosis not present

## 2020-04-19 DIAGNOSIS — E559 Vitamin D deficiency, unspecified: Secondary | ICD-10-CM

## 2020-04-19 DIAGNOSIS — E538 Deficiency of other specified B group vitamins: Secondary | ICD-10-CM | POA: Diagnosis not present

## 2020-04-19 DIAGNOSIS — Z853 Personal history of malignant neoplasm of breast: Secondary | ICD-10-CM

## 2020-04-19 MED ORDER — CYANOCOBALAMIN 1000 MCG/ML IJ SOLN
1000.0000 ug | Freq: Once | INTRAMUSCULAR | Status: AC
  Administered 2020-04-19: 1000 ug via INTRAMUSCULAR

## 2020-04-19 NOTE — Progress Notes (Signed)
This visit was conducted in person.  BP 140/82 (BP Location: Right Arm, Patient Position: Sitting, Cuff Size: Normal)   Pulse 62   Temp 97.8 F (36.6 C) (Temporal)   Ht 5\' 2"  (1.575 m)   Wt 135 lb 8 oz (61.5 kg)   SpO2 95%   BMI 24.78 kg/m    CC: review DEXA results Subjective:    Patient ID: Crystal Bass, female    DOB: 1941/06/04, 79 y.o.   MRN: [id number]  HPI: Crystal Bass is a 79 y.o. female presenting on 04/19/2020 for Results (Wants to discuss DEXA results. )   Recent DEXA 03/2020 - T score spine -2.5, hip -2.6 (worsened) Prior DEXA 2017 - T score spine -1.6, hip -2.0  She was on arimidex for 10 years following breast cancer therapy.  She had previously been on zometa through onc.   She has been taking vit D 50k units weekly for years, planning to start maintenance 2000 IU daily.  She is not taking calcium supplements. Has limited calcium in diet - does eat/drink milk, cheese or cottage cheese daily. Limited leafy greens.      Relevant past medical, surgical, family and social history reviewed and updated as indicated. Interim medical history since our last visit reviewed. Allergies and medications reviewed and updated. Outpatient Medications Prior to Visit  Medication Sig Dispense Refill  . acetaminophen (TYLENOL) 500 MG tablet Take 2 tablets (1,000 mg total) by mouth 2 (two) times daily as needed. (Patient taking differently: Take 1,000 mg by mouth 2 (two) times daily as needed for mild pain or moderate pain. )    . albuterol (VENTOLIN HFA) 108 (90 Base) MCG/ACT inhaler Inhale 2 puffs into the lungs every 6 (six) hours as needed for wheezing or shortness of breath. 18 g 1  . aspirin 81 MG tablet Take 1 tablet (81 mg total) by mouth daily. 30 tablet   . bisoprolol (ZEBETA) 5 MG tablet TAKE 1/2 TABLET BY MOUTH DAILY 45 tablet 3  . Carbidopa-Levodopa ER (SINEMET CR) 25-100 MG tablet controlled release TAKE 2 TABS IN THE AM, 1 IN AFTERNOON, 1 IN THE EVENING 360  tablet 1  . clonazePAM (KLONOPIN) 0.5 MG tablet TAKE 0.5 TABLETS (0.25 MG TOTAL) BY MOUTH AT BEDTIME. 45 tablet 1  . EPINEPHrine 0.3 mg/0.3 mL IJ SOAJ injection     . Fluticasone-Umeclidin-Vilant (TRELEGY ELLIPTA) 100-62.5-25 MCG/INH AEPB Inhale 1 puff into the lungs daily. 60 each 6  . gabapentin ([name]) 300 MG capsule TAKE 2 CAPSULES (600 MG TOTAL) BY MOUTH 3 (THREE) TIMES DAILY (Patient taking differently: Take 600 mg by mouth as directed. Pt takes 2 tabs in the morning , 1 in the afternoon and 2 in the evening) 540 capsule 1  . omeprazole (PRILOSEC) 20 MG capsule TAKE 1 CAPSULE BY MOUTH EVERY DAY 90 capsule 1  . VITAMIN D PO Take 50,000 Int'l Units by mouth once a week.    . cefdinir (OMNICEF) 300 MG capsule Take 1 capsule (300 mg total) by mouth 2 (two) times daily. 14 capsule 0  . saccharomyces boulardii (FLORASTOR) 250 MG capsule Take 1 capsule (250 mg total) by mouth 2 (two) times daily. 20 capsule 0   No facility-administered medications prior to visit.     Per HPI unless specifically indicated in ROS section below Review of Systems Objective:  BP 140/82 (BP Location: Right Arm, Patient Position: Sitting, Cuff Size: Normal)   Pulse 62   Temp 97.8 F (36.6 C) (Temporal)  Ht 5\' 2"  (1.575 m)   Wt 135 lb 8 oz (61.5 kg)   SpO2 95%   BMI 24.78 kg/m   Wt Readings from Last 3 Encounters:  04/19/20 135 lb 8 oz (61.5 kg)  04/08/20 135 lb 6.4 oz (61.4 kg)  04/01/20 135 lb (61.2 kg)      Physical Exam Vitals and nursing note reviewed.  Constitutional:      Appearance: Normal appearance. She is not ill-appearing.  Neurological:     Mental Status: She is alert.  Psychiatric:        Mood and Affect: Mood normal.        Behavior: Behavior normal.       DEXA 03/2020 - T score spine -2.5, hip -2.6 (worsened) DEXA 2017 - T score spine -1.6, hip -2.0 Assessment & Plan:  This visit occurred during the SARS-CoV-2 public health emergency.  Safety protocols were in place, including  screening questions prior to the visit, additional usage of staff PPE, and extensive cleaning of exam room while observing appropriate contact time as indicated for disinfecting solutions.   Problem List Items Addressed This Visit    Vitamin D deficiency    Has not yet started 2000 IU daily (instead taking weekly replacement dose) - will start OTC 2000 IU.       Vitamin B12 deficiency    B12 shot today.       Osteoporosis, post-menopausal - Primary    Reviewed latest DEXA results with patient as well as options for osteoporosis treatment. Reviewed in detail dietary calcium recommendations and if not reaching this to start daily calcium supplement replacement. Weight bearing exercise limited due to fall risk. She previously was on zometa. Discussed bisphosphonate vs prolia - she is interested in pricing out prolia so will do this and be in touch with cost for patient. She agrees with plan. Osteoporosis handout provided.  FRAX 10 yr risk score - major osteoporotic fracture 19%, hip fracture 6.7%.       History of left breast cancer    Completed 10 yrs arimidex. Continues yearly unilateral mammograms given h/o mastectomy          Meds ordered this encounter  Medications  . cyanocobalamin ((VITAMIN B-12)) injection 1,000 mcg   No orders of the defined types were placed in this encounter.   Patient instructions: B12 shot today  You do have osteoporosis on latest bone density scan.  Goal 1200mg  calcium daily, 1000 IU vitamin D daily. I do suggest taking 2000 units daily vitamin D.  Try to get most or all of your calcium from your food--aim for 1000 mg/day for women up to 68 and men up to 70 and 1200 mg/day for women over 79 and men over 70.  To figure out dietary calcium: 300 mg/day from all non dairy foods plus 300 mg per cup of milk, other dairy, or fortified juice. Non dairy foods that contain calcium: Kale, oranges, sardines, oatmeal, soy milk/soybeans, salmon, white beans, dried  figs, turnip greens, almonds, broccoli, tofu.  We will price out prolia for you - and call you with estimate of cost then you can decide if you'd like to do the Q6 month prolia shot in office or return to bisphosphonate (zometa).   Follow up plan: Return if symptoms worsen or fail to improve.  [name], MD

## 2020-04-19 NOTE — Patient Instructions (Addendum)
B12 shot today  You do have osteoporosis on latest bone density scan.  Goal 1263m calcium daily, 1000 IU vitamin D daily. I do suggest taking 2000 units daily vitamin D.  Try to get most or all of your calcium from your food--aim for 1000 mg/day for women up to 578and men up to 70 and 1200 mg/day for women over 59and men over 70.  To figure out dietary calcium: 300 mg/day from all non dairy foods plus 300 mg per cup of milk, other dairy, or fortified juice. Non dairy foods that contain calcium: Kale, oranges, sardines, oatmeal, soy milk/soybeans, salmon, white beans, dried figs, turnip greens, almonds, broccoli, tofu.  We will price out prolia for you - and call you with estimate of cost then you can decide if you'd like to do the Q6 month prolia shot in office or return to bisphosphonate (zometa).   Osteoporosis  Osteoporosis is thinning and loss of density in your bones. Osteoporosis makes bones more brittle and fragile and more likely to break (fracture). Over time, osteoporosis can cause your bones to become so weak that they fracture after a minor fall. Bones in the hip, wrist, and spine are most likely to fracture due to osteoporosis. What are the causes? The exact cause of this condition is not known. What increases the risk? You may be at greater risk for osteoporosis if you:  Have a family history of the condition.  Have poor nutrition.  Use steroid medicines, such as prednisone.  Are female.  Are age 8658or older.  Smoke or have a history of smoking.  Are not physically active (are sedentary).  Are white (Caucasian) or of Asian descent.  Have a small body frame.  Take certain medicines, such as antiseizure medicines. What are the signs or symptoms? A fracture might be the first sign of osteoporosis, especially if the fracture results from a fall or injury that usually would not cause a bone to break. Other signs and symptoms include:  Pain in the neck or low  back.  Stooped posture.  Loss of height. How is this diagnosed? This condition may be diagnosed based on:  Your medical history.  A physical exam.  A bone mineral density test, also called a DXA or DEXA test (dual-energy X-ray absorptiometry test). This test uses X-rays to measure the amount of minerals in your bones. How is this treated? The goal of treatment is to strengthen your bones and lower your risk for a fracture. Treatment may involve:  Making lifestyle changes, such as: ? Including foods with more calcium and vitamin D in your diet. ? Doing weight-bearing and muscle-strengthening exercises. ? Stopping tobacco use. ? Limiting alcohol intake.  Taking medicine to slow the process of bone loss or to increase bone density.  Taking daily supplements of calcium and vitamin D.  Taking hormone replacement medicines, such as estrogen for women and testosterone for men.  Monitoring your levels of calcium and vitamin D. Follow these instructions at home:  Activity  Exercise as told by your health care provider. Ask your health care provider what exercises and activities are safe for you. You should do: ? Exercises that make you work against gravity (weight-bearing exercises), such as tai chi, yoga, or walking. ? Exercises to strengthen muscles, such as lifting weights. Lifestyle  Limit alcohol intake to no more than 1 drink a day for nonpregnant women and 2 drinks a day for men. One drink equals 12 oz of beer, 5  oz of wine, or 1 oz of hard liquor.  Do not use any products that contain nicotine or tobacco, such as cigarettes and e-cigarettes. If you need help quitting, ask your health care provider. Preventing falls  Use devices to help you move around (mobility aids) as needed, such as canes, walkers, scooters, or crutches.  Keep rooms well-lit and clutter-free.  Remove tripping hazards from walkways, including cords and throw rugs.  Install grab bars in bathrooms and  safety rails on stairs.  Use rubber mats in the bathroom and other areas that are often wet or slippery.  Wear closed-toe shoes that fit well and support your feet. Wear shoes that have rubber soles or low heels.  Review your medicines with your health care provider. Some medicines can cause dizziness or changes in blood pressure, which can increase your risk of falling. General instructions  Include calcium and vitamin D in your diet. Calcium is important for bone health, and vitamin D helps your body to absorb calcium. Good sources of calcium and vitamin D include: ? Certain fatty fish, such as salmon and tuna. ? Products that have calcium and vitamin D added to them (fortified products), such as fortified cereals. ? Egg yolks. ? Cheese. ? Liver.  Take over-the-counter and prescription medicines only as told by your health care provider.  Keep all follow-up visits as told by your health care provider. This is important. Contact a health care provider if:  You have never been screened for osteoporosis and you are: ? A woman who is age 21 or older. ? A man who is age 67 or older. Get help right away if:  You fall or injure yourself. Summary  Osteoporosis is thinning and loss of density in your bones. This makes bones more brittle and fragile and more likely to break (fracture),even with minor falls.  The goal of treatment is to strengthen your bones and reduce your risk for a fracture.  Include calcium and vitamin D in your diet. Calcium is important for bone health, and vitamin D helps your body to absorb calcium.  Talk with your health care provider about screening for osteoporosis if you are a woman who is age 68 or older, or a man who is age 48 or older. This information is not intended to replace advice given to you by your health care provider. Make sure you discuss any questions you have with your health care provider. Document Revised: 10/25/2017 Document Reviewed:  09/06/2017 Elsevier Patient Education  2020 [employer].

## 2020-04-23 NOTE — Assessment & Plan Note (Addendum)
Completed 10 yrs arimidex. Continues yearly unilateral mammograms given h/o mastectomy

## 2020-04-23 NOTE — Assessment & Plan Note (Addendum)
Reviewed latest DEXA results with patient as well as options for osteoporosis treatment. Reviewed in detail dietary calcium recommendations and if not reaching this to start daily calcium supplement replacement. Weight bearing exercise limited due to fall risk. She previously was on zometa. Discussed bisphosphonate vs prolia - she is interested in pricing out prolia so will do this and be in touch with cost for patient. She agrees with plan. Osteoporosis handout provided.  FRAX 10 yr risk score - major osteoporotic fracture 19%, hip fracture 6.7%.

## 2020-04-23 NOTE — Assessment & Plan Note (Signed)
B12 shot today. 

## 2020-04-23 NOTE — Assessment & Plan Note (Addendum)
Has not yet started 2000 IU daily (instead taking weekly replacement dose) - will start OTC 2000 IU.

## 2020-04-26 ENCOUNTER — Telehealth: Payer: Self-pay | Admitting: Neurology

## 2020-04-26 DIAGNOSIS — R1314 Dysphagia, pharyngoesophageal phase: Secondary | ICD-10-CM

## 2020-04-26 DIAGNOSIS — G20 Parkinson's disease: Secondary | ICD-10-CM

## 2020-04-26 NOTE — Telephone Encounter (Signed)
Contacted patient and she requested to have pt and speech therapy at [facility]. She gave me the number, informed patient that i would send her referral there. She voiced understanding.   [place] ph: [phone].  Fax: [phone]  Left message at [facility] to contact office and leave fax number.

## 2020-04-26 NOTE — Telephone Encounter (Signed)
Patient called and said her referral for PT and speech therapy needs rerouted to: [facility] PT Main department.

## 2020-04-27 ENCOUNTER — Telehealth: Payer: Self-pay

## 2020-04-27 NOTE — Telephone Encounter (Signed)
Received called from Brutus at [facility] rehab she wanted to know if the patient needed to do the [place] or regular rehab?

## 2020-04-28 NOTE — Telephone Encounter (Signed)
I doubt she will be able to tolerate LSVT but they can find out if she can go that often.  More importantly, I referred her a month ago.  Are they just now getting around to this referral?

## 2020-04-28 NOTE — Telephone Encounter (Signed)
Spoke with Jackelyn Poling and she stated she will speak with the patient.   Dr Tat we referred the patient to neuro rehab and the patient called this week and requested to go to [facility].

## 2020-05-02 ENCOUNTER — Ambulatory Visit: Payer: Medicare PPO | Attending: Neurology | Admitting: Speech Pathology

## 2020-05-02 DIAGNOSIS — R490 Dysphonia: Secondary | ICD-10-CM | POA: Diagnosis not present

## 2020-05-03 ENCOUNTER — Encounter: Payer: Self-pay | Admitting: Speech Pathology

## 2020-05-03 NOTE — Therapy (Signed)
[place] MAIN [facility] SERVICES [address] [place], [place], [address] Phone: [phone]   Fax:  [phone]  Speech Language Pathology Evaluation  Patient Details  Name: Crystal Bass MRN: [id number] Date of Birth: January 16, 1941 Referring Provider (SLP): Alonza Bogus   Encounter Date: 05/02/2020  End of Session - 05/03/20 0722    Visit Number  1    Number of Visits  17    Date for SLP Re-Evaluation  05/31/20    Authorization Type  [employer]    Authorization Time Period  start 05/02/2020    Authorization - Visit Number  1    Progress Note Due on Visit  10    SLP Start Time  1500    SLP Stop Time   1600    SLP Time Calculation (min)  60 min    Activity Tolerance  Patient tolerated treatment well       Past Medical History:  Diagnosis Date   Aneurysm of aorta ([place])    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   Aortic root aneurysm ([place])    Arthritis    Asthma    Breast cancer ([place]) 2002   infiltrative ductal carcinoma    Cataract 2019   COPD (chronic obstructive pulmonary disease) ([place]) 9/08   Ductal carcinoma of breast, estrogen receptor positive, stage 1 ([place]) 09/16/2012   Endometriosis    GERD (gastroesophageal reflux disease)    Hypercalcemia 09/14/2013   Hypertension    Lesion of breast 1992   right, benign   Lung disease    secondary to [name] infection   Mastalgia 7/95   Osteoporosis, post-menopausal 03/14/2012   Ovarian cancer ([place]) 2004   Pancreatitis    secondary to Cholelithiasis   Parkinson disease ([place]) 02/10/15   Post-thoracotomy pain syndrome    Pseudomonas pneumonia ([place]) 5/09   Uterine fibroid    Vitamin D deficiency     Past Surgical History:  Procedure Laterality Date   ABDOMINAL HYSTERECTOMY     & BSO for Mucinous borderline tumor of R ovary 2004   APPENDECTOMY  2004   BREAST BIOPSY  08/2004   right breast-benign   BREAST LUMPECTOMY  1992   benign   CARDIAC  CATHETERIZATION  2007   essentially negation for significant CAD   CHOLECYSTECTOMY  2001   COLONOSCOPY  2014   Dr [name] , due 2019   COLONOSCOPY  09/2018   2 TA removed, int hem, no f/u needed (Dr [name])    ESOPHAGOGASTRODUODENOSCOPY  09/2018   GERD with esophagitis and stricture dilated, antral gastritis negative for H pylori [name])   LUNG BIOPSY  2002   [name], Dr [name]   MASTECTOMY MODIFIED RADICAL Left 2002   oral chemotheraphy (tamoxifen then Armidex) no radiation, Dr.[name]   TEE WITHOUT CARDIOVERSION N/A 12/29/2018   Procedure: TRANSESOPHAGEAL ECHOCARDIOGRAM (TEE);  Surgeon: Wellington Hampshire, MD;  Location: [facility] ORS;  Service: Cardiovascular;  Laterality: N/A;   TONSILLECTOMY  1946    There were no vitals filed for this visit.  Subjective Assessment - 05/03/20 1093    Subjective  My voice sounds more gravely than before    Currently in Pain?  No/denies         SLP Evaluation [facility] - 05/03/20 2355      SLP Visit Information   SLP Received On  05/03/20    Referring Provider (SLP)  Wells Guiles Tat    Onset Date  02/10/2015    Medical  Diagnosis  Parkinson's Disease      Subjective   Subjective  "my voice is more gravely than before"    Patient/Family Stated Goal  change quality of voice      General Information   HPI  79 year old woman with Parkinson's disease      Prior Functional Status   Cognitive/Linguistic Baseline  Within functional limits      Cognition   Overall Cognitive Status  Within Functional Limits for tasks assessed      Auditory Comprehension   Overall Auditory Comprehension  Appears within functional limits for tasks assessed      Reading Comprehension   Reading Status  Within funtional limits      Expression   Primary Mode of Expression  Verbal      Verbal Expression   Overall Verbal Expression  Appears within functional limits for tasks assessed      Oral Motor/Sensory Function   Overall Oral Motor/Sensory Function  Appears  within functional limits for tasks assessed      Motor Speech   Overall Motor Speech  Impaired    Respiration  Within functional limits    Phonation  Hoarse;Low vocal intensity    Resonance  Within functional limits    Articulation  Within functional limitis    Intelligibility  Intelligible    Motor Planning  Impaired    Level of Impairment  Conversation    Phonation  Impaired    Volume  Soft    Pitch  Low      Standardized Assessments   Standardized Assessments   Other Assessment   LSVT-LOUD Voice Evaluation     LSVT-LOUD VOICE Evaluation Maximum phonation time for sustained "ah": 20 seconds Mean intensity during sustained "ah": 75dB Mean intensity sustained during conversational speech: 66dB Highest dynamic pitch when altering pitch from a low note to a high note: 241dB Lowest dynamic pitch when altering from a high note to a low note: 241dB Mean intensity during conversational monologue: 66dB Mean intensity during word fluency task: 65dB Mean intensity while completing motor simultaneously with conversation: not attempted     ADULT SLP TREATMENT - 05/03/20 0001      General Information   Behavior/Cognition  Alert;Cooperative;Pleasant mood        SLP Education - 05/03/20 0721    Education provided  Yes    Education Details  LSVT LOUD    Person(s) Educated  Patient    Methods  Explanation;Demonstration    Comprehension  Verbalized understanding         SLP Long Term Goals - 05/03/20 0731      SLP LONG TERM GOAL #1   Title  The patient will complete Daily Tasks (Maximum duration "ah", High/Lows, and Functional Phrases) at average loudness of 80 dB and with loud, good quality voice.     Time  4    Period  Weeks    Status  New    Target Date  05/31/20      SLP LONG TERM GOAL #2   Title  The patient will complete Hierarchal Speech Loudness reading drills (words/phrases, sentences, and paragraph) at average 75 dB and with loud, good quality voice.      Time   4    Period  Weeks    Status  New    Target Date  05/31/20      SLP LONG TERM GOAL #3   Title  The patient will complete homework daily.    Baseline  New  goal    Time  4    Period  Weeks    Status  New    Target Date  05/31/20      SLP LONG TERM GOAL #4   Title  The patient will participate in conversation, maintaining average loudness of 75 dB and loud, good quality voice.    Time  4    Period  Weeks    Status  New    Target Date  05/31/20       Plan - 05/03/20 0724    Clinical Impression Statement  This 79 year old woman diagnosed with Parkinsons disease is present with mild-moderate voice disorder characterized by hoarse vocal quality, hypophonia and monotone voice. Based on stimulability testing, the patient is judged to be a good candidate for the LSVT LOUD program. It is recommended that the patient receive the LSVT LOUD program which is comprised of 16 intensive sessions (4 times per week for 4 weeks, one-hour sessions). Although cognitive testing was not part of this evaluation, pt reports increased memory deficits.     Speech Therapy Frequency  4x / week    Duration  4 weeks    Treatment/Interventions  SLP instruction and feedback;Patient/family education    Potential to Achieve Goals  Good    Potential Considerations  Ability to learn/carryover information    SLP Home Exercise Plan  LSVT-LOUD homework    Consulted and Agree with Plan of Care  Patient       Patient will benefit from skilled therapeutic intervention in order to improve the following deficits and impairments:   Dysphonia    Problem List Patient Active Problem List   Diagnosis Date Noted   Mitral valve prolapse 04/08/2020   UTI (urinary tract infection) 03/26/2020   Medicare annual wellness visit, subsequent 01/27/2020   REM sleep behavior disorder 01/27/2020   Chronic neuropathic pain 01/27/2020   Acute midline low back pain with right-sided sciatica 10/26/2019   Orthostatic syncope  10/26/2019   [name] (mycobacterium avium-intracellulare) infection ([place]) 11/01/2018   Pulmonary embolism and infarction ([place]) 10/31/2018   Orthostatic hypotension 08/25/2018   Renal cyst, acquired, right 02/02/2018   Advanced care planning/counseling discussion 01/26/2018   Liver lesion 01/21/2018   Vitamin B12 deficiency 01/21/2018   Chronic fatigue 10/23/2017   Lesion of left lung 10/23/2017   Genetic testing 05/01/2017   Health maintenance examination 12/27/2016   Parkinson disease ([place]) 01/10/2016   DNR (do not resuscitate) 05/22/2015   Tremor 02/07/2015   Dysphagia, pharyngoesophageal phase 02/07/2015   Hypercalcemia 09/14/2013   Vitamin D deficiency 04/27/2013   Osteoporosis, post-menopausal 03/14/2012   Chronic dyspnea 03/28/2011   Aortic aneurysm ([place]) 01/01/2011   OSTEOARTHRITIS, CERVICAL SPINE 08/15/2009   ALLERGIC RHINITIS 03/25/2008   Chronic obstructive airway disease with asthma ([place]) 03/25/2008   History of left breast cancer 03/10/2008   Essential hypertension 02/10/2007    Cesar Alf B. Rutherford Nail M.S., CCC-SLP, [place] Pathologist Rehabilitation Services Office [phone]  Stormy Fabian 05/03/2020, 4:06 PM  [place] MAIN [facility] SERVICES [address] [place], [place], [address] Phone: [phone]   Fax:  [phone]  Name: Crystal Bass MRN: [id number] Date of Birth: 1941/11/13

## 2020-05-04 ENCOUNTER — Encounter: Payer: Medicare PPO | Admitting: Speech Pathology

## 2020-05-04 ENCOUNTER — Telehealth: Payer: Self-pay | Admitting: Family Medicine

## 2020-05-04 ENCOUNTER — Encounter: Payer: Self-pay | Admitting: Family Medicine

## 2020-05-04 ENCOUNTER — Telehealth (INDEPENDENT_AMBULATORY_CARE_PROVIDER_SITE_OTHER): Payer: Medicare PPO | Admitting: Family Medicine

## 2020-05-04 VITALS — BP 110/69 | HR 65 | Ht 62.0 in | Wt 131.0 lb

## 2020-05-04 DIAGNOSIS — T783XXA Angioneurotic edema, initial encounter: Secondary | ICD-10-CM

## 2020-05-04 DIAGNOSIS — J029 Acute pharyngitis, unspecified: Secondary | ICD-10-CM

## 2020-05-04 NOTE — Progress Notes (Signed)
Virtual visit completed through MyChart, a video enabled telemedicine application. Due to national recommendations of social distancing due to COVID-19, a virtual visit is felt to be most appropriate for this patient at this time. Reviewed limitations, risks, security and privacy concerns of performing a virtual visit and the availability of in person appointments. I also reviewed that there may be a patient responsible charge related to this service. The patient agreed to proceed.   Patient location: home Provider location: [place] at [facility], office Persons participating in this virtual visit: patient, provider  If any vitals were documented, they were collected by patient at home unless specified below.    BP 110/69   Pulse 65   Ht 5\' 2"  (1.575 m)   Wt 131 lb (59.4 kg)   BMI 23.96 kg/m   BP Readings from Last 3 Encounters:  05/04/20 110/69  04/19/20 140/82  04/08/20 124/78    CC: ST, HA Subjective:    Patient ID: Crystal Bass, female    DOB: 1941-02-23, 79 y.o.   MRN: [id number]  HPI: Crystal Bass is a 79 y.o. female presenting on 05/04/2020 for Sore Throat   6d h/o sore throat when swallowing - felt lump on right side of throat. Treated this with chloraseptic lozenges with benefit. This lasted 3 days and has since resolved. 5d ago had bad headache not improved despite tylenol 500mg  TID finally took aleve with benefit - HA has also resolved. 3d ago woke up with lip redness and swelling and burning as well as fleeting tongue swelling - this persists but is better today.   No fevers/chills, PNDrainage, ear or tooth pain, cough, or wheezing. Chronic loss of taste or smell. Appetite stable, no nausea, diarrhea, abd pain. No chest pain or pressure. No noted exudates in her throat. Denies skin rash or hives.   She was on omnicef course last month for bronchitis.  Previously on allergy shots, not any longer. She still has epi pen at home.   No new vitamins, supplements,  medications.  No new foods, detergents, soaps or shampoos.  She did start new Trelegy ellipta package as well as new klonopin formulation.   She recently had allergy testing through Dr Donneta Romberg. Told was allergic to [place] grasses.  She noticed new bruising to L elbow - on aspirin.     Relevant past medical, surgical, family and social history reviewed and updated as indicated. Interim medical history since our last visit reviewed. Allergies and medications reviewed and updated. Outpatient Medications Prior to Visit  Medication Sig Dispense Refill  . acetaminophen (TYLENOL) 500 MG tablet Take 2 tablets (1,000 mg total) by mouth 2 (two) times daily as needed. (Patient taking differently: Take 1,000 mg by mouth 2 (two) times daily as needed for mild pain or moderate pain. )    . albuterol (VENTOLIN HFA) 108 (90 Base) MCG/ACT inhaler Inhale 2 puffs into the lungs every 6 (six) hours as needed for wheezing or shortness of breath. 18 g 1  . aspirin 81 MG tablet Take 1 tablet (81 mg total) by mouth daily. 30 tablet   . bisoprolol (ZEBETA) 5 MG tablet TAKE 1/2 TABLET BY MOUTH DAILY 45 tablet 3  . Carbidopa-Levodopa ER (SINEMET CR) 25-100 MG tablet controlled release TAKE 2 TABS IN THE AM, 1 IN AFTERNOON, 1 IN THE EVENING 360 tablet 1  . clonazePAM (KLONOPIN) 0.5 MG tablet TAKE 0.5 TABLETS (0.25 MG TOTAL) BY MOUTH AT BEDTIME. 45 tablet 1  . EPINEPHrine 0.3  mg/0.3 mL IJ SOAJ injection     . Fluticasone-Umeclidin-Vilant (TRELEGY ELLIPTA) 100-62.5-25 MCG/INH AEPB Inhale 1 puff into the lungs daily. 60 each 6  . gabapentin ([name]) 300 MG capsule Take 300 mg by mouth as directed. Pt takes 2 tabs in the morning , 1 in the afternoon and 2 in the evening    . omeprazole (PRILOSEC) 20 MG capsule TAKE 1 CAPSULE BY MOUTH EVERY DAY 90 capsule 1  . VITAMIN D PO Take 50,000 Int'l Units by mouth once a week.    . gabapentin ([name]) 300 MG capsule TAKE 2 CAPSULES (600 MG TOTAL) BY MOUTH 3 (THREE) TIMES DAILY  (Patient taking differently: Take 600 mg by mouth as directed. Pt takes 2 tabs in the morning , 1 in the afternoon and 2 in the evening) 540 capsule 1   No facility-administered medications prior to visit.     Per HPI unless specifically indicated in ROS section below Review of Systems Objective:  BP 110/69   Pulse 65   Ht 5\' 2"  (1.575 m)   Wt 131 lb (59.4 kg)   BMI 23.96 kg/m   Wt Readings from Last 3 Encounters:  05/04/20 131 lb (59.4 kg)  04/19/20 135 lb 8 oz (61.5 kg)  04/08/20 135 lb 6.4 oz (61.4 kg)       Physical exam: Gen: alert, NAD, not ill appearing HEENT: mild swelling of lips noted over video Pulm: speaks in complete sentences without increased work of breathing Psych: normal mood, normal thought content      Assessment & Plan:   Problem List Items Addressed This Visit    Sore throat    Episode of sore throat with headache for several days, now resolved. Unclear etiology. With new lip swelling concerning for angioedema - see below.       Angioedema - Primary    New possible angioedema - unclear etiology. Only new med was recent omnicef course (last month for bronchitis) and new klonopin formulation (different color than prior med). She did recent stop allergy shots but still has Epi pen available at home. Will recommend continue benadryl at night, add allegra in am and Rx prednisone taper. Will refer back to allergist for further evaluation as well. Reviewed ER precautions.       Relevant Orders   Ambulatory referral to Allergy       Meds ordered this encounter  Medications  . predniSONE (DELTASONE) 20 MG tablet    Sig: Take two tablets daily for 3 days followed by one tablet daily for 3 days    Dispense:  9 tablet    Refill:  0   Orders Placed This Encounter  Procedures  . Ambulatory referral to Allergy    Referral Priority:   Routine    Referral Type:   Allergy Testing    Referral Reason:   Specialty Services Required    Requested Specialty:    Allergy    Number of Visits Requested:   1    I discussed the assessment and treatment plan with the patient. The patient was provided an opportunity to ask questions and all were answered. The patient agreed with the plan and demonstrated an understanding of the instructions. The patient was advised to call back or seek an in-person evaluation if the symptoms worsen or if the condition fails to improve as anticipated.  Follow up plan: Return if symptoms worsen or fail to improve.  [name], MD

## 2020-05-04 NOTE — Telephone Encounter (Signed)
Can we price out prolia for patient? Thank you.  

## 2020-05-06 ENCOUNTER — Other Ambulatory Visit: Payer: Self-pay | Admitting: Cardiovascular Disease

## 2020-05-06 MED ORDER — PREDNISONE 20 MG PO TABS
ORAL_TABLET | ORAL | 0 refills | Status: DC
Start: 2020-05-06 — End: 2020-08-03

## 2020-05-06 NOTE — Telephone Encounter (Signed)
*  STAT* If patient is at the pharmacy, call can be transferred to refill team.   1. Which medications need to be refilled? (please list name of each medication and dose if known) bisoprolol 5 mg (1/2 daily)  2. Which pharmacy/location (including street and city if local pharmacy) is medication to be sent to? [place]  3. Do they need a 30 day or 90 day supply? [place]

## 2020-05-06 NOTE — Assessment & Plan Note (Addendum)
New possible angioedema - unclear etiology. Only new med was recent omnicef course (last month for bronchitis) and new klonopin formulation (different color than prior med). She did recent stop allergy shots but still has Epi pen available at home. Will recommend continue benadryl at night, add allegra in am and Rx prednisone taper. Will refer back to allergist for further evaluation as well. Reviewed ER precautions.

## 2020-05-06 NOTE — Assessment & Plan Note (Deleted)
Episode of sore throat with headache for several days, now resolved. Unclear etiology. With new lip swelling concerning for angioedema - see below.

## 2020-05-06 NOTE — Assessment & Plan Note (Signed)
Episode of sore throat with headache for several days, now resolved. Unclear etiology. With new lip swelling concerning for angioedema - see below.

## 2020-05-09 MED ORDER — BISOPROLOL FUMARATE 5 MG PO TABS: 2.5000 mg | ORAL_TABLET | Freq: Every day | ORAL | 0 refills | Status: DC

## 2020-05-09 NOTE — Telephone Encounter (Signed)
Requested Prescriptions   Signed Prescriptions Disp Refills   bisoprolol (ZEBETA) 5 MG tablet 45 tablet 0    Sig: Take 0.5 tablets (2.5 mg total) by mouth daily.    Authorizing Provider: [name] A    Ordering User: Raelene Bott, Arbie Blankley L

## 2020-05-10 DIAGNOSIS — J3089 Other allergic rhinitis: Secondary | ICD-10-CM | POA: Diagnosis not present

## 2020-05-10 DIAGNOSIS — T783XXA Angioneurotic edema, initial encounter: Secondary | ICD-10-CM | POA: Diagnosis not present

## 2020-05-10 DIAGNOSIS — J4530 Mild persistent asthma, uncomplicated: Secondary | ICD-10-CM | POA: Diagnosis not present

## 2020-05-10 DIAGNOSIS — J301 Allergic rhinitis due to pollen: Secondary | ICD-10-CM | POA: Diagnosis not present

## 2020-05-11 ENCOUNTER — Encounter: Payer: Medicare PPO | Admitting: Speech Pathology

## 2020-05-11 ENCOUNTER — Telehealth: Payer: Self-pay

## 2020-05-11 DIAGNOSIS — M810 Age-related osteoporosis without current pathological fracture: Secondary | ICD-10-CM

## 2020-05-11 NOTE — Telephone Encounter (Signed)
Pt is a new start for Prolia.  Pt has [employer]. Pt would owe approximately $0.  No admin fee and no copay.  Prolia is authorized.  KFEX#[id number], valid 6-11 to 11-25-20.

## 2020-05-12 NOTE — Telephone Encounter (Signed)
Patient returned your call.

## 2020-05-12 NOTE — Telephone Encounter (Signed)
LVM

## 2020-05-16 ENCOUNTER — Encounter: Payer: Medicare PPO | Admitting: Speech Pathology

## 2020-05-16 NOTE — Addendum Note (Signed)
Addended by: Randall An on: 05/16/2020 05:37 PM   Modules accepted: Orders

## 2020-05-16 NOTE — Telephone Encounter (Signed)
Pt scheduled for lab on 6/23 and prolia inj on 7/1. Added lab order

## 2020-05-17 ENCOUNTER — Other Ambulatory Visit: Payer: Self-pay

## 2020-05-18 ENCOUNTER — Encounter: Payer: Medicare PPO | Admitting: Speech Pathology

## 2020-05-18 ENCOUNTER — Other Ambulatory Visit (INDEPENDENT_AMBULATORY_CARE_PROVIDER_SITE_OTHER): Payer: Medicare PPO

## 2020-05-18 ENCOUNTER — Ambulatory Visit: Payer: Medicare PPO

## 2020-05-18 DIAGNOSIS — M810 Age-related osteoporosis without current pathological fracture: Secondary | ICD-10-CM

## 2020-05-18 LAB — BASIC METABOLIC PANEL
BUN: 15 mg/dL (ref 6–23)
CO2: 29 mEq/L (ref 19–32)
Calcium: 9.2 mg/dL (ref 8.4–10.5)
Chloride: 101 mEq/L (ref 96–112)
Creatinine, Ser: 0.69 mg/dL (ref 0.40–1.20)
GFR: 82.46 mL/min (ref >60.00)
Glucose, Bld: 93 mg/dL (ref 70–99)
Potassium: 4.5 mEq/L (ref 3.5–5.1)
Sodium: 135 mEq/L (ref 135–145)

## 2020-05-19 ENCOUNTER — Other Ambulatory Visit: Payer: Self-pay | Admitting: Neurology

## 2020-05-19 NOTE — Telephone Encounter (Signed)
Rx(s) sent to pharmacy electronically.  

## 2020-05-19 NOTE — Telephone Encounter (Signed)
Calcium level normal and CrCl calculation is 64.62mL/min Ok for pt to get inj

## 2020-05-20 DIAGNOSIS — J3089 Other allergic rhinitis: Secondary | ICD-10-CM | POA: Diagnosis not present

## 2020-05-20 DIAGNOSIS — J301 Allergic rhinitis due to pollen: Secondary | ICD-10-CM | POA: Diagnosis not present

## 2020-05-24 ENCOUNTER — Encounter: Payer: Medicare PPO | Admitting: Speech Pathology

## 2020-05-24 DIAGNOSIS — J3089 Other allergic rhinitis: Secondary | ICD-10-CM | POA: Diagnosis not present

## 2020-05-24 DIAGNOSIS — J301 Allergic rhinitis due to pollen: Secondary | ICD-10-CM | POA: Diagnosis not present

## 2020-05-26 ENCOUNTER — Ambulatory Visit (INDEPENDENT_AMBULATORY_CARE_PROVIDER_SITE_OTHER): Payer: Medicare PPO | Admitting: *Deleted

## 2020-05-26 ENCOUNTER — Other Ambulatory Visit: Payer: Self-pay

## 2020-05-26 ENCOUNTER — Ambulatory Visit: Payer: Medicare PPO | Admitting: Speech Pathology

## 2020-05-26 ENCOUNTER — Ambulatory Visit: Payer: Medicare PPO

## 2020-05-26 DIAGNOSIS — M810 Age-related osteoporosis without current pathological fracture: Secondary | ICD-10-CM | POA: Diagnosis not present

## 2020-05-26 MED ORDER — DENOSUMAB 60 MG/ML ~~LOC~~ SOSY
60.0000 mg | PREFILLED_SYRINGE | Freq: Once | SUBCUTANEOUS | Status: AC
  Administered 2020-05-26: 60 mg via SUBCUTANEOUS

## 2020-05-26 NOTE — Progress Notes (Signed)
Per orders of Dr.Gutierrez, injection of denosumab (PROLIA) injection 60 mg  given by Erika Hussar. Patient tolerated injection well.

## 2020-05-31 ENCOUNTER — Encounter: Payer: Medicare PPO | Admitting: Speech Pathology

## 2020-05-31 ENCOUNTER — Ambulatory Visit: Payer: Medicare PPO | Admitting: Physical Therapy

## 2020-05-31 ENCOUNTER — Ambulatory Visit: Payer: Medicare PPO

## 2020-05-31 DIAGNOSIS — J301 Allergic rhinitis due to pollen: Secondary | ICD-10-CM | POA: Diagnosis not present

## 2020-05-31 DIAGNOSIS — J3089 Other allergic rhinitis: Secondary | ICD-10-CM | POA: Diagnosis not present

## 2020-06-02 ENCOUNTER — Encounter: Payer: Medicare PPO | Admitting: Speech Pathology

## 2020-06-02 ENCOUNTER — Ambulatory Visit: Payer: Medicare PPO | Admitting: Cardiovascular Disease

## 2020-06-07 ENCOUNTER — Ambulatory Visit: Payer: Medicare PPO

## 2020-06-07 ENCOUNTER — Encounter: Payer: Medicare PPO | Admitting: Speech Pathology

## 2020-06-07 DIAGNOSIS — J3089 Other allergic rhinitis: Secondary | ICD-10-CM | POA: Diagnosis not present

## 2020-06-07 DIAGNOSIS — J301 Allergic rhinitis due to pollen: Secondary | ICD-10-CM | POA: Diagnosis not present

## 2020-06-08 ENCOUNTER — Ambulatory Visit (INDEPENDENT_AMBULATORY_CARE_PROVIDER_SITE_OTHER): Payer: Medicare PPO

## 2020-06-08 ENCOUNTER — Other Ambulatory Visit: Payer: Self-pay

## 2020-06-08 DIAGNOSIS — E538 Deficiency of other specified B group vitamins: Secondary | ICD-10-CM | POA: Diagnosis not present

## 2020-06-08 MED ORDER — CYANOCOBALAMIN 1000 MCG/ML IJ SOLN
1000.0000 ug | Freq: Once | INTRAMUSCULAR | Status: AC
  Administered 2020-06-08: 1000 ug via INTRAMUSCULAR

## 2020-06-08 NOTE — Progress Notes (Signed)
Pt received monthly B12 injection, per Dr Gutierrez's orders, in Right Deltoid. Pt tolerated well.

## 2020-06-09 ENCOUNTER — Encounter: Payer: Medicare PPO | Admitting: Speech Pathology

## 2020-06-09 ENCOUNTER — Ambulatory Visit: Payer: Medicare PPO

## 2020-06-16 DIAGNOSIS — J3089 Other allergic rhinitis: Secondary | ICD-10-CM | POA: Diagnosis not present

## 2020-06-16 DIAGNOSIS — J301 Allergic rhinitis due to pollen: Secondary | ICD-10-CM | POA: Diagnosis not present

## 2020-06-21 ENCOUNTER — Other Ambulatory Visit: Payer: Self-pay | Admitting: Neurology

## 2020-06-21 ENCOUNTER — Ambulatory Visit: Payer: Medicare PPO | Admitting: Physical Therapy

## 2020-06-21 DIAGNOSIS — J301 Allergic rhinitis due to pollen: Secondary | ICD-10-CM | POA: Diagnosis not present

## 2020-06-21 DIAGNOSIS — J3089 Other allergic rhinitis: Secondary | ICD-10-CM | POA: Diagnosis not present

## 2020-06-24 DIAGNOSIS — J301 Allergic rhinitis due to pollen: Secondary | ICD-10-CM | POA: Diagnosis not present

## 2020-06-24 DIAGNOSIS — J3089 Other allergic rhinitis: Secondary | ICD-10-CM | POA: Diagnosis not present

## 2020-06-27 ENCOUNTER — Ambulatory Visit: Payer: Medicare PPO | Admitting: Speech Pathology

## 2020-06-27 ENCOUNTER — Ambulatory Visit: Payer: Medicare PPO | Admitting: Physical Therapy

## 2020-06-28 ENCOUNTER — Ambulatory Visit: Payer: Medicare PPO | Admitting: Physical Therapy

## 2020-06-28 ENCOUNTER — Encounter: Payer: Medicare PPO | Admitting: Speech Pathology

## 2020-06-28 DIAGNOSIS — J3089 Other allergic rhinitis: Secondary | ICD-10-CM | POA: Diagnosis not present

## 2020-06-28 DIAGNOSIS — J301 Allergic rhinitis due to pollen: Secondary | ICD-10-CM | POA: Diagnosis not present

## 2020-06-29 ENCOUNTER — Ambulatory Visit: Payer: Medicare PPO | Admitting: Physical Therapy

## 2020-06-29 ENCOUNTER — Encounter: Payer: Medicare PPO | Admitting: Speech Pathology

## 2020-06-30 ENCOUNTER — Ambulatory Visit: Payer: Medicare PPO | Admitting: Physical Therapy

## 2020-06-30 ENCOUNTER — Encounter: Payer: Medicare PPO | Admitting: Speech Pathology

## 2020-07-01 ENCOUNTER — Telehealth: Payer: Self-pay | Admitting: Neurology

## 2020-07-01 NOTE — Telephone Encounter (Signed)
Patient called in and left a message that she now has a new pharmacy and needs a prescription of her carbidopa-levodopa sent to [facility] in [place], [place]

## 2020-07-02 ENCOUNTER — Encounter: Payer: Self-pay | Admitting: Pharmacist

## 2020-07-04 ENCOUNTER — Ambulatory Visit: Payer: Medicare PPO | Admitting: Physical Therapy

## 2020-07-04 ENCOUNTER — Encounter: Payer: Medicare PPO | Admitting: Speech Pathology

## 2020-07-05 ENCOUNTER — Ambulatory Visit: Payer: Medicare PPO | Admitting: Physical Therapy

## 2020-07-05 ENCOUNTER — Encounter: Payer: Medicare PPO | Admitting: Speech Pathology

## 2020-07-05 DIAGNOSIS — J301 Allergic rhinitis due to pollen: Secondary | ICD-10-CM | POA: Diagnosis not present

## 2020-07-05 DIAGNOSIS — J3089 Other allergic rhinitis: Secondary | ICD-10-CM | POA: Diagnosis not present

## 2020-07-06 ENCOUNTER — Encounter: Payer: Medicare PPO | Admitting: Speech Pathology

## 2020-07-06 ENCOUNTER — Ambulatory Visit: Payer: Medicare PPO | Admitting: Physical Therapy

## 2020-07-07 ENCOUNTER — Ambulatory Visit: Payer: Medicare PPO | Admitting: Physical Therapy

## 2020-07-07 ENCOUNTER — Encounter: Payer: Medicare PPO | Admitting: Speech Pathology

## 2020-07-07 DIAGNOSIS — J3089 Other allergic rhinitis: Secondary | ICD-10-CM | POA: Diagnosis not present

## 2020-07-07 DIAGNOSIS — J301 Allergic rhinitis due to pollen: Secondary | ICD-10-CM | POA: Diagnosis not present

## 2020-07-11 ENCOUNTER — Encounter: Payer: Medicare PPO | Admitting: Speech Pathology

## 2020-07-11 ENCOUNTER — Ambulatory Visit: Payer: Medicare PPO | Admitting: Physical Therapy

## 2020-07-12 ENCOUNTER — Ambulatory Visit: Payer: Medicare PPO | Admitting: Physical Therapy

## 2020-07-12 ENCOUNTER — Encounter: Payer: Medicare PPO | Admitting: Speech Pathology

## 2020-07-13 ENCOUNTER — Ambulatory Visit: Payer: Medicare PPO | Admitting: Physical Therapy

## 2020-07-13 ENCOUNTER — Encounter: Payer: Medicare PPO | Admitting: Speech Pathology

## 2020-07-14 ENCOUNTER — Ambulatory Visit: Payer: Medicare PPO | Admitting: Physical Therapy

## 2020-07-14 ENCOUNTER — Encounter: Payer: Medicare PPO | Admitting: Speech Pathology

## 2020-07-14 DIAGNOSIS — J301 Allergic rhinitis due to pollen: Secondary | ICD-10-CM | POA: Diagnosis not present

## 2020-07-14 DIAGNOSIS — J3089 Other allergic rhinitis: Secondary | ICD-10-CM | POA: Diagnosis not present

## 2020-07-18 ENCOUNTER — Ambulatory Visit: Payer: Medicare PPO | Admitting: Physical Therapy

## 2020-07-18 ENCOUNTER — Encounter: Payer: Medicare PPO | Admitting: Speech Pathology

## 2020-07-19 ENCOUNTER — Other Ambulatory Visit: Payer: Self-pay | Admitting: Internal Medicine

## 2020-07-19 ENCOUNTER — Ambulatory Visit: Payer: Medicare PPO | Admitting: Physical Therapy

## 2020-07-19 ENCOUNTER — Encounter: Payer: Medicare PPO | Admitting: Speech Pathology

## 2020-07-19 DIAGNOSIS — J3089 Other allergic rhinitis: Secondary | ICD-10-CM | POA: Diagnosis not present

## 2020-07-19 DIAGNOSIS — J301 Allergic rhinitis due to pollen: Secondary | ICD-10-CM | POA: Diagnosis not present

## 2020-07-20 ENCOUNTER — Encounter: Payer: Medicare PPO | Admitting: Speech Pathology

## 2020-07-20 ENCOUNTER — Ambulatory Visit: Payer: Medicare PPO | Admitting: Physical Therapy

## 2020-07-21 ENCOUNTER — Encounter: Payer: Medicare PPO | Admitting: Speech Pathology

## 2020-07-21 ENCOUNTER — Ambulatory Visit: Payer: Medicare PPO | Admitting: Physical Therapy

## 2020-07-22 DIAGNOSIS — H524 Presbyopia: Secondary | ICD-10-CM | POA: Diagnosis not present

## 2020-07-25 ENCOUNTER — Ambulatory Visit: Payer: Medicare PPO | Attending: Internal Medicine

## 2020-07-25 DIAGNOSIS — Z23 Encounter for immunization: Secondary | ICD-10-CM

## 2020-07-25 NOTE — Progress Notes (Signed)
   Covid-19 Vaccination Clinic  Name:  [name]    MRN: [id number] DOB: [birth year]  07/25/2020  Crystal Bass was observed post Covid-19 immunization for 15 minutes without incident. She was provided with Vaccine Information Sheet and instruction to access the V-Safe system.   [name] was instructed to call 911 with any severe reactions post vaccine: [name] Difficulty breathing  . Swelling of face and throat  . A fast heartbeat  . A bad rash all over body  . Dizziness and weakness   Immunizations Administered    Name Date Dose VIS Date Route   Moderna COVID-19 Vaccine 07/25/2020  9:52 AM 0.5 mL 10/2019 Intramuscular   Manufacturer: Moderna   Lot: [id number]   [place]: 01724-195-42

## 2020-07-29 ENCOUNTER — Other Ambulatory Visit: Payer: Self-pay

## 2020-07-29 ENCOUNTER — Ambulatory Visit
Admission: EM | Admit: 2020-07-29 | Discharge: 2020-07-29 | Disposition: A | Discharge status: Home / Self Care | Payer: Medicare PPO | Attending: Emergency Medicine | Admitting: Emergency Medicine

## 2020-07-29 DIAGNOSIS — Z0189 Encounter for other specified special examinations: Secondary | ICD-10-CM | POA: Diagnosis not present

## 2020-07-29 DIAGNOSIS — Z01818 Encounter for other preprocedural examination: Secondary | ICD-10-CM | POA: Diagnosis not present

## 2020-07-29 DIAGNOSIS — Z20822 Contact with and (suspected) exposure to covid-19: Secondary | ICD-10-CM | POA: Insufficient documentation

## 2020-07-29 LAB — SARS CORONAVIRUS 2 (TAT 6-24 HRS): SARS Coronavirus 2: NEGATIVE

## 2020-07-29 NOTE — Discharge Instructions (Signed)
If you know you were exposed to a confirmed case of COVID-19, continue quarantine until your test results are available.  Interact as little as possible until COVID-19 test results are available.  Continue to keep her social distance of 6 feet from others, wash hands frequently and wear facemask when indoors or when you are unable to social distance in outdoor settings.  If you develop any symptoms, reach out to the urgent care for further instructions.  If your test results are positive, a member of the urgent care team will reach out to you with further instructions. If you have any further questions, please don't hesitate to reach out to the urgent care clinic.  Please sign up for MyChart to access your lab results. 

## 2020-07-29 NOTE — ED Triage Notes (Signed)
Pt present to [place] for covid testing. She had an exposure, not having symptoms.

## 2020-08-02 DIAGNOSIS — J301 Allergic rhinitis due to pollen: Secondary | ICD-10-CM | POA: Diagnosis not present

## 2020-08-02 DIAGNOSIS — J3089 Other allergic rhinitis: Secondary | ICD-10-CM | POA: Diagnosis not present

## 2020-08-03 ENCOUNTER — Encounter: Payer: Self-pay | Admitting: Family Medicine

## 2020-08-03 ENCOUNTER — Other Ambulatory Visit: Payer: Self-pay

## 2020-08-03 ENCOUNTER — Ambulatory Visit: Payer: Medicare PPO | Admitting: Family Medicine

## 2020-08-03 VITALS — BP 146/86 | HR 67 | Temp 97.7°F | Ht 62.0 in | Wt 137.4 lb

## 2020-08-03 DIAGNOSIS — E538 Deficiency of other specified B group vitamins: Secondary | ICD-10-CM

## 2020-08-03 DIAGNOSIS — J449 Chronic obstructive pulmonary disease, unspecified: Secondary | ICD-10-CM | POA: Diagnosis not present

## 2020-08-03 DIAGNOSIS — I10 Essential (primary) hypertension: Secondary | ICD-10-CM

## 2020-08-03 DIAGNOSIS — I951 Orthostatic hypotension: Secondary | ICD-10-CM | POA: Diagnosis not present

## 2020-08-03 DIAGNOSIS — M810 Age-related osteoporosis without current pathological fracture: Secondary | ICD-10-CM

## 2020-08-03 DIAGNOSIS — I341 Nonrheumatic mitral (valve) prolapse: Secondary | ICD-10-CM

## 2020-08-03 DIAGNOSIS — Z23 Encounter for immunization: Secondary | ICD-10-CM | POA: Diagnosis not present

## 2020-08-03 DIAGNOSIS — Z86711 Personal history of pulmonary embolism: Secondary | ICD-10-CM | POA: Diagnosis not present

## 2020-08-03 DIAGNOSIS — I712 Thoracic aortic aneurysm, without rupture, unspecified: Secondary | ICD-10-CM

## 2020-08-03 DIAGNOSIS — T783XXD Angioneurotic edema, subsequent encounter: Secondary | ICD-10-CM | POA: Diagnosis not present

## 2020-08-03 MED ORDER — AMLODIPINE BESYLATE 5 MG PO TABS: 5.0000 mg | ORAL_TABLET | Freq: Every day | ORAL | 6 refills | Status: DC

## 2020-08-03 MED ORDER — CYANOCOBALAMIN 1000 MCG/ML IJ SOLN
1000.0000 ug | Freq: Once | INTRAMUSCULAR | Status: AC
  Administered 2020-08-03: 1000 ug via INTRAMUSCULAR

## 2020-08-03 NOTE — Progress Notes (Signed)
This visit was conducted in person.  BP (!) 146/86 (BP Location: Right Arm, Cuff Size: Normal)   Pulse 67   Temp 97.7 F (36.5 C) (Temporal)   Ht 5\' 2"  (1.575 m)   Wt 137 lb 6 oz (62.3 kg)   SpO2 95%   BMI 25.13 kg/m   BP Readings from Last 3 Encounters:  08/03/20 (!) 146/86  07/29/20 (!) 164/89  05/04/20 110/69    CC: 6 mo f/u visit  Subjective:    Patient ID: Crystal Bass, female    DOB: June 25, 1941, 79 y.o.   MRN: [id number]  HPI: Crystal Bass is a 79 y.o. female presenting on 08/03/2020 for Follow-up (Here for 6 mo f/u.)   Recently tested negative for COVID at [facility] after possible exposure.  Recently received Moderna COVID booster last week. Had 24 hour headache after shot.  Possible new angioedema after starting omnicef. Saw allergist - rec daily allegra for 1-2 months (has continued this) and restarted allergy shots.   COPD - sees [facility] pulm. Ongoing exertional dyspnea. rec continue trelegy 100 1 puff daily with PRN albuterol inhaler.   Osteoporosis - received first Prolia 60mg  injection 05/26/2020.  DEXA 03/2020 - T score spine -2.5, hip -2.6 (worsened)  HTN - Compliant with current antihypertensive regimen of bisoprolol 2.5mg  daily.  Does check blood pressures at home: this morning 267 systolic prior to med. After medication BP dropped to 133/85. No low blood pressure readings or symptoms of dizziness/syncope. Denies CP/tightness, leg swelling. Keeps headache. Known chronic dyspnea. Prior on metoprolol - worried this was causing fatigue.      Relevant past medical, surgical, family and social history reviewed and updated as indicated. Interim medical history since our last visit reviewed. Allergies and medications reviewed and updated. Outpatient Medications Prior to Visit  Medication Sig Dispense Refill  . acetaminophen (TYLENOL) 500 MG tablet Take 2 tablets (1,000 mg total) by mouth 2 (two) times daily as needed. (Patient taking differently: Take 1,000 mg by mouth  2 (two) times daily as needed for mild pain or moderate pain. )    . albuterol (VENTOLIN HFA) 108 (90 Base) MCG/ACT inhaler Inhale 2 puffs into the lungs every 6 (six) hours as needed for wheezing or shortness of breath. 18 g 1  . aspirin 81 MG tablet Take 1 tablet (81 mg total) by mouth daily. 30 tablet   . bisoprolol (ZEBETA) 5 MG tablet Take 0.5 tablets (2.5 mg total) by mouth daily. 45 tablet 0  . Carbidopa-Levodopa ER (SINEMET CR) 25-100 MG tablet controlled release TAKE 2 TABS IN THE AM, 1 IN AFTERNOON, 1 IN THE EVENING 360 tablet 1  . clonazePAM (KLONOPIN) 0.5 MG tablet TAKE 1/2 A TABLET BY MOUTH AT BEDTIME 45 tablet 1  . EPINEPHrine 0.3 mg/0.3 mL IJ SOAJ injection     . Fluticasone-Umeclidin-Vilant (TRELEGY ELLIPTA) 100-62.5-25 MCG/INH AEPB Inhale 1 puff into the lungs daily. 60 each 6  . gabapentin ([name]) 300 MG capsule Take 300 mg by mouth as directed. Pt takes 2 tabs in the morning , 1 in the afternoon and 2 in the evening    . omeprazole (PRILOSEC) 20 MG capsule TAKE 1 CAPSULE BY MOUTH ONCE DAILY 90 capsule 0  . VITAMIN D PO Take 50,000 Int'l Units by mouth once a week. (Patient not taking: Reported on 08/03/2020)    . predniSONE (DELTASONE) 20 MG tablet Take two tablets daily for 3 days followed by one tablet daily for 3 days 9 tablet  0   No facility-administered medications prior to visit.     Per HPI unless specifically indicated in ROS section below Review of Systems Objective:  BP (!) 146/86 (BP Location: Right Arm, Cuff Size: Normal)   Pulse 67   Temp 97.7 F (36.5 C) (Temporal)   Ht 5\' 2"  (1.575 m)   Wt 137 lb 6 oz (62.3 kg)   SpO2 95%   BMI 25.13 kg/m   Wt Readings from Last 3 Encounters:  08/03/20 137 lb 6 oz (62.3 kg)  05/04/20 131 lb (59.4 kg)  04/19/20 135 lb 8 oz (61.5 kg)      Physical Exam Vitals and nursing note reviewed.  Constitutional:      Appearance: Normal appearance. She is not ill-appearing.  Eyes:     Extraocular Movements: Extraocular  movements intact.     Pupils: Pupils are equal, round, and reactive to light.  Cardiovascular:     Rate and Rhythm: Normal rate and regular rhythm.     Pulses: Normal pulses.     Heart sounds: Murmur (2/6 systolic LLSB) heard.   Pulmonary:     Effort: Pulmonary effort is normal. No respiratory distress.     Breath sounds: Rhonchi present. No wheezing or rales.     Comments: Coarse breath sounds Musculoskeletal:     Right lower leg: No edema.     Left lower leg: No edema.  Neurological:     Mental Status: She is alert.       Results for orders placed or performed in visit on 52/84/13  Basic metabolic panel  Result Value Ref Range   Sodium 138 135 - 145 mEq/L   Potassium 3.5 3.5 - 5.1 mEq/L   Chloride 102 96 - 112 mEq/L   CO2 29 19 - 32 mEq/L   Glucose, Bld 84 70 - 99 mg/dL   BUN 14 6 - 23 mg/dL   Creatinine, Ser 0.71 0.40 - 1.20 mg/dL   GFR 79.74 >60.00 mL/min   Calcium 9.0 8.4 - 10.5 mg/dL   Assessment & Plan:  This visit occurred during the SARS-CoV-2 public health emergency.  Safety protocols were in place, including screening questions prior to the visit, additional usage of staff PPE, and extensive cleaning of exam room while observing appropriate contact time as indicated for disinfecting solutions.   Problem List Items Addressed This Visit    Vitamin B12 deficiency    b12 shot today.       Thoracic aortic aneurysm without rupture ([place])    Overdue for rpt monitoring - will order CT angio chest. Check kidney function prior to ordering.       Relevant Medications   amLODipine (NORVASC) 2.5 MG tablet   Other Relevant Orders   Basic metabolic panel (Completed)   CT ANGIO CHEST AORTA W/CM & OR WO/CM   Osteoporosis, post-menopausal    First prolia injection 05/2020       Orthostatic hypotension    Not on northera for months.       Relevant Medications   amLODipine (NORVASC) 2.5 MG tablet   Mitral valve prolapse    H/o this. Consider updated echo vs return to  cards.       Relevant Medications   amLODipine (NORVASC) 2.5 MG tablet   History of pulmonary embolism   Essential hypertension - Primary    Chronic, overall stable however pt desires to try different antihypertensive. H/o fluctuating blood pressures (am readings prior to med remain high), h/o orthostasis in h/o  PD. On low dose bisoprolol 2.5mg  for years - transitioned from metoprolol to help possible associated fatigue, but without significant improvement. Will trial amlodipine 2.5mg  daily in place of bisoprolol. Previously on losartan. Previously also on northera. No current hypotensive symptoms.       Relevant Medications   amLODipine (NORVASC) 2.5 MG tablet   Chronic obstructive airway disease with asthma (HCC)    Stable period on trelegy. Ongoing baseline dyspnea.       Angioedema    ?omnicef related vs allergy related. Better after daily allegra x 3 months and has restarted allergy shots.        Other Visit Diagnoses    Need for influenza vaccination       Relevant Orders   Flu Vaccine QUAD High Dose(Fluad) (Completed)       Meds ordered this encounter  Medications  . cyanocobalamin ((VITAMIN B-12)) injection 1,000 mcg  . DISCONTD: amLODipine (NORVASC) 5 MG tablet    Sig: Take 1 tablet (5 mg total) by mouth daily.    Dispense:  30 tablet    Refill:  6  . amLODipine (NORVASC) 2.5 MG tablet    Sig: Take 1 tablet (2.5 mg total) by mouth daily.    Dispense:  30 tablet    Refill:  6    Use this dose   Orders Placed This Encounter  Procedures  . CT ANGIO CHEST AORTA W/CM & OR WO/CM    Standing Status:   Future    Standing Expiration Date:   08/04/2021    Order Specific Question:   If indicated for the ordered procedure, I authorize the administration of contrast media per Radiology protocol    Answer:   Yes    Order Specific Question:   Preferred imaging location?    Answer:   [facility]    Order Specific Question:   Radiology Contrast Protocol - do NOT  remove file path    Answer:   [email or web address]  . Flu Vaccine QUAD High Dose(Fluad)  . Basic metabolic panel    Patient Instructions  Flu shot today b12 shot today  We will check CT angio of chest to follow thoracic aortic aneurysm  Labs today to check kidneys  Try amlodipine 5mg  daily in place of bisoprolol. Watch heart rate and blood pressure on this medicine. Update me with effect in 1-2 weeks.    Follow up plan: Return if symptoms worsen or fail to improve.  [name], MD

## 2020-08-03 NOTE — Patient Instructions (Addendum)
Flu shot today b12 shot today  We will check CT angio of chest to follow thoracic aortic aneurysm  Labs today to check kidneys  Try amlodipine 5mg  daily in place of bisoprolol. Watch heart rate and blood pressure on this medicine. Update me with effect in 1-2 weeks.

## 2020-08-04 ENCOUNTER — Telehealth: Payer: Self-pay | Admitting: Family Medicine

## 2020-08-04 LAB — BASIC METABOLIC PANEL
BUN: 14 mg/dL (ref 6–23)
CO2: 29 mEq/L (ref 19–32)
Calcium: 9 mg/dL (ref 8.4–10.5)
Chloride: 102 mEq/L (ref 96–112)
Creatinine, Ser: 0.71 mg/dL (ref 0.40–1.20)
GFR: 79.74 mL/min (ref >60.00)
Glucose, Bld: 84 mg/dL (ref 70–99)
Potassium: 3.5 mEq/L (ref 3.5–5.1)
Sodium: 138 mEq/L (ref 135–145)

## 2020-08-04 MED ORDER — AMLODIPINE BESYLATE 2.5 MG PO TABS: 2.5000 mg | ORAL_TABLET | Freq: Every day | ORAL | 6 refills | Status: DC

## 2020-08-04 NOTE — Telephone Encounter (Signed)
Spoke with Crystal Bass notifying him Dr. Darnell Level sent new rx at yesterday's OV for amlodipine 2.5 mg.  Verbalizes understanding, will deactivate 5 mg tab and says 2.5 mg tab is ready to pick up.

## 2020-08-04 NOTE — Assessment & Plan Note (Signed)
?  omnicef related vs allergy related. Better after daily allegra x 3 months and has restarted allergy shots.

## 2020-08-04 NOTE — Telephone Encounter (Signed)
Gerald Stabs with [facility] left v/m requesting cb since got amlodipine 5 mg rx on 09/08/21and then today got amlodipine 2.5 mg with note to use this dose. Is amlodipine 5 mg to be deactivated?

## 2020-08-04 NOTE — Telephone Encounter (Signed)
plz notify - since we're starting new BP med (amlodipine in place of bisoprolol), I'd like her to start amlodipine at 2.5mg  daily (1/2 tab of 5mg  if she's picked 5mg  up, otherwise 2.5mg  dose is at pharmacy). Watch blood pressures closely while starting new medicine to ensure no low readings or low symptoms develop.

## 2020-08-04 NOTE — Assessment & Plan Note (Signed)
Stable period on trelegy. Ongoing baseline dyspnea.

## 2020-08-04 NOTE — Assessment & Plan Note (Signed)
b12 shot today. 

## 2020-08-04 NOTE — Assessment & Plan Note (Signed)
Not on northera for months.

## 2020-08-04 NOTE — Assessment & Plan Note (Addendum)
Overdue for rpt monitoring - will order CT angio chest. Check kidney function prior to ordering.

## 2020-08-04 NOTE — Assessment & Plan Note (Addendum)
H/o this. Consider updated echo vs return to cards.

## 2020-08-04 NOTE — Assessment & Plan Note (Addendum)
Chronic, overall stable however pt desires to try different antihypertensive. H/o fluctuating blood pressures (am readings prior to med remain high), h/o orthostasis in h/o PD. On low dose bisoprolol 2.5mg  for years - transitioned from metoprolol to help possible associated fatigue, but without significant improvement. Will trial amlodipine 2.5mg  daily in place of bisoprolol. Previously on losartan. Previously also on northera. No current hypotensive symptoms.

## 2020-08-04 NOTE — Assessment & Plan Note (Signed)
First prolia injection 05/2020

## 2020-08-05 DIAGNOSIS — J301 Allergic rhinitis due to pollen: Secondary | ICD-10-CM | POA: Diagnosis not present

## 2020-08-05 DIAGNOSIS — J3089 Other allergic rhinitis: Secondary | ICD-10-CM | POA: Diagnosis not present

## 2020-08-08 NOTE — Telephone Encounter (Signed)
Pt called stating she received a letter from [facility]  They are unable to make a decision on the injection.  Still process claim needing more information.    Can you help with this

## 2020-08-09 DIAGNOSIS — J3089 Other allergic rhinitis: Secondary | ICD-10-CM | POA: Diagnosis not present

## 2020-08-09 DIAGNOSIS — J301 Allergic rhinitis due to pollen: Secondary | ICD-10-CM | POA: Diagnosis not present

## 2020-08-09 NOTE — Telephone Encounter (Addendum)
Pt reports she received a letter on 07/31/20 from [facility] that reports there was an adverse termination on her prolia determination and injection on 7/1. It does not say she will owe for the prolia but it does say the prolia does cost $2500 and they are missing some information but it does not say what info they are missing. The letter is from Nuala Alpha, Director of Medicare Claims & CRV and the number to call is [phone] if there are any questions. Advised pt this nurse will f/u with [facility] and get back with her. Pt appreciative. Claim number on the letter is [id number]  PA letter is in the prolia book and is now in this nurses office,. PA was authorized on 05/06/20 to 11/25/20 and Auth # is [id number]

## 2020-08-15 ENCOUNTER — Other Ambulatory Visit: Payer: Self-pay | Admitting: Pulmonary Disease

## 2020-08-15 DIAGNOSIS — J449 Chronic obstructive pulmonary disease, unspecified: Secondary | ICD-10-CM

## 2020-08-15 NOTE — Telephone Encounter (Signed)
Advised pt of progress with insurance. Pt appreciative and verbalized understanding.

## 2020-08-15 NOTE — Telephone Encounter (Signed)
Contacted [employer] and talked to [place], [employer] and Elmyra Ricks and was transferred 3 times. Elmyra Ricks found the letter sent to the pt and said they did not need anything further from the practice and she would fax the letter. Ref # is [id number], phone number if needed is [phone].

## 2020-08-17 ENCOUNTER — Ambulatory Visit
Admission: RE | Admit: 2020-08-17 | Discharge: 2020-08-17 | Disposition: A | Discharge status: Home / Self Care | Payer: Medicare PPO | Source: Ambulatory Visit | Attending: Family Medicine | Admitting: Family Medicine

## 2020-08-17 ENCOUNTER — Ambulatory Visit: Payer: Medicare PPO

## 2020-08-17 DIAGNOSIS — I712 Thoracic aortic aneurysm, without rupture, unspecified: Secondary | ICD-10-CM

## 2020-08-17 DIAGNOSIS — I700 Atherosclerosis of aorta: Secondary | ICD-10-CM | POA: Diagnosis not present

## 2020-08-17 DIAGNOSIS — I2510 Atherosclerotic heart disease of native coronary artery without angina pectoris: Secondary | ICD-10-CM | POA: Diagnosis not present

## 2020-08-17 DIAGNOSIS — J479 Bronchiectasis, uncomplicated: Secondary | ICD-10-CM | POA: Diagnosis not present

## 2020-08-17 MED ORDER — IOPAMIDOL (ISOVUE-370) INJECTION 76%
75.0000 mL | Freq: Once | INTRAVENOUS | Status: AC | PRN
  Administered 2020-08-17: 75 mL via INTRAVENOUS

## 2020-08-18 DIAGNOSIS — J3089 Other allergic rhinitis: Secondary | ICD-10-CM | POA: Diagnosis not present

## 2020-08-18 DIAGNOSIS — J301 Allergic rhinitis due to pollen: Secondary | ICD-10-CM | POA: Diagnosis not present

## 2020-08-18 NOTE — Telephone Encounter (Signed)
Received call from [place] specialist helping with this letter. She reports she talked to pts insurance and they do not see what is holding up this claim . They are sending it back through for expediting processing. She said they advised her to check back in about 10 days and they should have a result.    Contacted pt and advised. Pt appreciative. Advised if she had any further communication in the mail to contact this office. Pt verbalized understanding.

## 2020-08-19 ENCOUNTER — Other Ambulatory Visit: Payer: Self-pay | Admitting: Cardiovascular Disease

## 2020-08-20 ENCOUNTER — Encounter: Payer: Self-pay | Admitting: Family Medicine

## 2020-08-20 DIAGNOSIS — N200 Calculus of kidney: Secondary | ICD-10-CM | POA: Insufficient documentation

## 2020-08-20 DIAGNOSIS — I700 Atherosclerosis of aorta: Secondary | ICD-10-CM | POA: Insufficient documentation

## 2020-08-23 DIAGNOSIS — J301 Allergic rhinitis due to pollen: Secondary | ICD-10-CM | POA: Diagnosis not present

## 2020-08-23 DIAGNOSIS — J3089 Other allergic rhinitis: Secondary | ICD-10-CM | POA: Diagnosis not present

## 2020-09-02 DIAGNOSIS — J3089 Other allergic rhinitis: Secondary | ICD-10-CM | POA: Diagnosis not present

## 2020-09-02 DIAGNOSIS — J301 Allergic rhinitis due to pollen: Secondary | ICD-10-CM | POA: Diagnosis not present

## 2020-09-06 ENCOUNTER — Other Ambulatory Visit: Payer: Self-pay | Admitting: Family Medicine

## 2020-09-06 NOTE — Telephone Encounter (Signed)
Gabapentin Last filled:  06/06/20, #540 Last OV:  08/03/20, 6 mo f/u Next OV:  none

## 2020-09-07 NOTE — Telephone Encounter (Signed)
ERx 

## 2020-09-09 DIAGNOSIS — J3089 Other allergic rhinitis: Secondary | ICD-10-CM | POA: Diagnosis not present

## 2020-09-09 DIAGNOSIS — J301 Allergic rhinitis due to pollen: Secondary | ICD-10-CM | POA: Diagnosis not present

## 2020-09-09 DIAGNOSIS — J3081 Allergic rhinitis due to animal (cat) (dog) hair and dander: Secondary | ICD-10-CM | POA: Diagnosis not present

## 2020-09-13 NOTE — Progress Notes (Signed)
Assessment/Plan:   1.  Parkinsons Disease  -Continue carbidopa/levodopa 25/100 CR, 2/1/1.  Changing from IR did not change hypersomnolence, but I did not change back because of orthostasis.  -asks about riding a road bike and I told her that she should not unless it is a tricycle  -talked about exercise  -talked about PT again - she has an Careers information officer.  I gave her names of therapists who do LSVT outside of [facility] (she is worried about going to hospital).  [facility] does it and she will let me know if wants referral  2.EDS -don't think that related to PD             -this is the biggest c/o.    Gabapentin likely is playing a role (although BP certainly an issue), but PCP attempting to lower 3. Long hx migraine -No longer a candidate for triptan therapy. If headaches resume, CGRP inhibitors could be an option now.  She has not had migraine for quite some time now 4. Orthostatic hypotension, with history of associated syncope -Refuses compression binder. -Compliance is an issue here.  Has been prescribed Northera by cardiology but has not taken.    -Patient is on blood pressure medications, but does not want to change that given her worry about her thoracic aortic aneurysm.  However, changing BP meds has helped her not feel so pre-syncopal 5. REM behavior disorder -Continue clonazepam 0.5 mg, half tablet at bedtime. 6. Mild cognitive impairment -Last neurocognitive testing was in September, 2018.  I have not seen significant change since that time.    Subjective:   Crystal Bass was seen today in follow up for Parkinsons disease.  My previous records were reviewed prior to todays visit as well as outside records available to me. Pt denies falls.  She denies any syncopal episodes.  Noted that primary care did start new blood pressure medication (amlodipine in place of bisoprolol).  She is tolerating it well.   States that the low BP is better than it was - still some occasional low blood pressure in the AM but "I know what to do."  No unsafe behaviors at bed with clonazepam 0.5 mg, half tablet at bedtime.  About 1 time every 4 months she will wake self up screaming.   I referred her for therapy was last visit.  It looks like she attended one speech therapy session and did not go back.  I do not see any physical therapy sessions attended.  Pt states that she didn't go because of the high rates of covid at the time.    "I've been very bad" with exercise.  Feels that balance is not as good.    Current prescribed movement disorder medications: Carbidopa/levodopa 25/100 CR, 2 tablets in the morning, 1 in the afternoon and 1 in the evening Clonazepam 0.5 mg, half tablet at night   PREVIOUS MEDICATIONS: Sinemet (changed from IR due to EDS but not sure it really made a difference but we did not change back because of [place]); northera (RX by cardiology but not taken)  ALLERGIES:   Allergies  Allergen Reactions  . Sulfonamide Derivatives Anaphylaxis  . Topamax [Topiramate] Other (See Comments)    Metabolic acidosis   . Biaxin [Clarithromycin] Other (See Comments)    pericarditis  . Hydrocodone Other (See Comments)    "hyper and climbing the walls"  . Motrin [Ibuprofen] Other (See Comments)    headaches  . Symbicort [Budesonide-Formoterol Fumarate] Other (See  Comments)    02/07/15 tremor  . Percocet [Oxycodone-Acetaminophen] Itching and Rash  . Tape Itching and Rash    Use paper tape only  . Tetracyclines & Related Other (See Comments)    "immediate yeast infection"    CURRENT MEDICATIONS:  Outpatient Encounter Medications as of 09/15/2020  Medication Sig  . acetaminophen (TYLENOL) 500 MG tablet Take 2 tablets (1,000 mg total) by mouth 2 (two) times daily as needed. (Patient taking differently: Take 1,000 mg by mouth 2 (two) times daily as needed for mild pain or moderate pain. )  . albuterol  (VENTOLIN HFA) 108 (90 Base) MCG/ACT inhaler Inhale 2 puffs into the lungs every 6 (six) hours as needed for wheezing or shortness of breath.  [name] amLODipine (NORVASC) 2.5 MG tablet Take 1 tablet (2.5 mg total) by mouth daily.  [name] aspirin 81 MG tablet Take 1 tablet (81 mg total) by mouth daily.  . Carbidopa-Levodopa ER (SINEMET CR) 25-100 MG tablet controlled release TAKE 2 TABS IN THE AM, 1 IN AFTERNOON, 1 IN THE EVENING  . clonazePAM (KLONOPIN) 0.5 MG tablet TAKE 1/2 A TABLET BY MOUTH AT BEDTIME  . EPINEPHrine 0.3 mg/0.3 mL IJ SOAJ injection   . gabapentin ([name]) 300 MG capsule TAKE 2 CAPSULES BY MOUTH 3 TIMES DAILY  . omeprazole (PRILOSEC) 20 MG capsule TAKE 1 CAPSULE BY MOUTH ONCE DAILY  . TRELEGY ELLIPTA 100-62.5-25 MCG/INH AEPB INHALE 1 PUFF BY MOUTH ONCE A DAY  . [DISCONTINUED] bisoprolol (ZEBETA) 5 MG tablet TAKE 1/2 TABLET BY MOUTH ONCE A DAY (Patient not taking: Reported on 09/15/2020)  . [DISCONTINUED] VITAMIN D PO Take 50,000 Int'l Units by mouth once a week.  (Patient not taking: Reported on 09/15/2020)   No facility-administered encounter medications on file as of 09/15/2020.    Objective:   PHYSICAL EXAMINATION:    VITALS:   Vitals:   09/15/20 1433  BP: 115/67  Pulse: 79  SpO2: 96%  Weight: 135 lb (61.2 kg)  Height: 5\' 2"  (1.575 m)    GEN:  The patient appears stated age and is in NAD. HEENT:  Normocephalic, atraumatic.  The mucous membranes are moist. The superficial temporal arteries are without ropiness or tenderness. CV:  RRR Lungs:  CTAB Neck/HEME:  There are no carotid bruits bilaterally.  Neurological examination:  Orientation: The patient is alert and oriented x3. Cranial nerves: There is good facial symmetry without facial hypomimia. The speech is fluent and clear. Soft palate rises symmetrically and there is no tongue deviation. Hearing is intact to conversational tone. Sensation: Sensation is intact to light touch throughout Motor: Strength is at  least antigravity x4.  Movement examination: Tone: There is mild increased tone in the RUE Abnormal movements: none Coordination:  There is mild decremation with finger taps on the right and heel/toe taps bilaterally Gait and Station: The patient has no difficulty arising out of a deep-seated chair without the use of the hands. The patient's stride length is good   I have reviewed and interpreted the following labs independently    Chemistry      Component Value Date/Time   NA 138 08/03/2020 1549   NA 141 12/23/2018 1514   NA 141 03/28/2017 1130   K 3.5 08/03/2020 1549   K 4.2 03/28/2017 1130   CL 102 08/03/2020 1549   CL 102 03/10/2013 1529   CO2 29 08/03/2020 1549   CO2 28 03/28/2017 1130   BUN 14 08/03/2020 1549   BUN 16 12/23/2018 1514  BUN 17.3 03/28/2017 1130   CREATININE 0.71 08/03/2020 1549   CREATININE 0.8 03/28/2017 1130      Component Value Date/Time   CALCIUM 9.0 08/03/2020 1549   CALCIUM 9.6 03/28/2017 1130   ALKPHOS 81 01/22/2020 1259   ALKPHOS 85 03/28/2017 1130   AST 12 01/22/2020 1259   AST 12 03/28/2017 1130   ALT 1 01/22/2020 1259   ALT <6 03/28/2017 1130   BILITOT 1.4 (H) 01/22/2020 1259   BILITOT 1.38 (H) 03/28/2017 1130       Lab Results  Component Value Date   WBC 5.9 12/23/2018   HGB 13.7 12/23/2018   HCT 40.5 12/23/2018   MCV 90 12/23/2018   PLT 167 12/23/2018    Lab Results  Component Value Date   TSH 1.893 10/26/2018     Total time spent on today's visit was 25 minutes, including both face-to-face time and nonface-to-face time.  Time included that spent on review of records (prior notes available to me/labs/imaging if pertinent), discussing treatment and goals, answering patient's questions and coordinating care.  Cc:  [name], MD

## 2020-09-15 ENCOUNTER — Other Ambulatory Visit: Payer: Self-pay

## 2020-09-15 ENCOUNTER — Encounter: Payer: Self-pay | Admitting: Neurology

## 2020-09-15 ENCOUNTER — Ambulatory Visit: Payer: Medicare PPO | Admitting: Neurology

## 2020-09-15 VITALS — BP 115/67 | HR 79 | Ht 62.0 in | Wt 135.0 lb

## 2020-09-15 DIAGNOSIS — G20 Parkinson's disease: Secondary | ICD-10-CM

## 2020-09-15 DIAGNOSIS — J3089 Other allergic rhinitis: Secondary | ICD-10-CM | POA: Diagnosis not present

## 2020-09-15 DIAGNOSIS — J301 Allergic rhinitis due to pollen: Secondary | ICD-10-CM | POA: Diagnosis not present

## 2020-09-15 NOTE — Patient Instructions (Addendum)
[facility]- [place], [place] [phone] [email or web address]  The physicians and staff at [facility] are committed to providing excellent care. You may receive a survey requesting feedback about your experience at our office. We strive to receive "very good" responses to the survey questions. If you feel that your experience would prevent you from giving the office a "very good " response, please contact our office to try to remedy the situation. We may be reached at [phone]. Thank you for taking the time out of your busy day to complete the survey.

## 2020-09-26 ENCOUNTER — Other Ambulatory Visit: Payer: Self-pay | Admitting: Neurology

## 2020-09-27 ENCOUNTER — Ambulatory Visit: Payer: Medicare PPO | Admitting: Neurology

## 2020-09-27 DIAGNOSIS — J3089 Other allergic rhinitis: Secondary | ICD-10-CM | POA: Diagnosis not present

## 2020-09-27 DIAGNOSIS — J301 Allergic rhinitis due to pollen: Secondary | ICD-10-CM | POA: Diagnosis not present

## 2020-09-28 ENCOUNTER — Other Ambulatory Visit (INDEPENDENT_AMBULATORY_CARE_PROVIDER_SITE_OTHER): Payer: Medicare PPO

## 2020-09-28 ENCOUNTER — Other Ambulatory Visit: Payer: Self-pay

## 2020-09-28 DIAGNOSIS — C561 Malignant neoplasm of right ovary: Secondary | ICD-10-CM

## 2020-09-29 LAB — CA 125: Cancer Antigen (CA) 125: 33.6 U/mL (ref 0.0–38.1)

## 2020-09-30 NOTE — Telephone Encounter (Signed)
No further communication concerning this matter has been received from the pt.

## 2020-10-03 ENCOUNTER — Telehealth: Payer: Self-pay | Admitting: *Deleted

## 2020-10-03 DIAGNOSIS — Z853 Personal history of malignant neoplasm of breast: Secondary | ICD-10-CM

## 2020-10-03 DIAGNOSIS — C561 Malignant neoplasm of right ovary: Secondary | ICD-10-CM

## 2020-10-03 NOTE — Telephone Encounter (Signed)
Patient is returning call.  °

## 2020-10-03 NOTE — Telephone Encounter (Signed)
-----   Message from Megan Salon, MD sent at 09/30/2020 12:55 PM EDT ----- Please let pt know her Ca-125 is 33.  Has been in the lower 30, upper 20 range for three years now.  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  She's been doing every 6 month tumor markers.  Next is due in April.  Typically has exam at that time.  Would recommend continuing with this.  Thanks.

## 2020-10-03 NOTE — Telephone Encounter (Signed)
Burnice Logan, RN  10/03/2020 11:20 AM EST Back to Top    Left message to call Sharee Pimple, RN at [facility].

## 2020-10-03 NOTE — Telephone Encounter (Signed)
Spoke with patient. Advised as seen below per Dr. Sabra Heck. Patient expressed concerns about CA 125 continuing to increase, is aware still in the normal range. Patient is asking if there are any additional recommendations vs waiting 6 months to repeat?   Patient states she is still undecided if she plans to follow Dr. Sabra Heck to new [place] location.   Advised patient I will review concerns with Dr. Sabra Heck and f/u, patient agreeable.   Routing to Dr. Sabra Heck to advise.

## 2020-10-04 NOTE — Telephone Encounter (Signed)
She may not remember this but in 2017/2018, her Ca-125 went from 15-18 range to the low 30 range where it's been since that time.  I discussed this with Dr. Katha Hamming, gyn/onc who recommended testing with some additional frequency and then staying at every 6 month tests.  This level will bounce around a little bit and won't just be flat.  Dr. Denman George felt her pulmonary disease (COPD) was also a contributing factor.  So, I feel comfortable with the very 6 month testing.  Thanks.

## 2020-10-04 NOTE — Telephone Encounter (Signed)
Spoke with patient. Advised as seen below per Dr. Sabra Heck. Patient understands recommendations, continues to express concern regarding CA 125 continuing to increase, does not want to wait 6 months for repeat testing. Patient was last seen by oncology 03/2017. Advised patient I will have to review with concerns with provider and f/u with any additional recommendations, patient agreeable.   Dr. [name] Simmonds -please review and advise.

## 2020-10-05 NOTE — Telephone Encounter (Signed)
Left message to call Yeraldi Fidler, RN at [facility] [phone].   

## 2020-10-05 NOTE — Telephone Encounter (Signed)
I understand the patient's concerns.  Other options for care could be to recheck her CA125 in one month or have a consultation with GYN ONCOLOGY.  What would she like to do?

## 2020-10-11 DIAGNOSIS — J301 Allergic rhinitis due to pollen: Secondary | ICD-10-CM | POA: Diagnosis not present

## 2020-10-11 DIAGNOSIS — J3089 Other allergic rhinitis: Secondary | ICD-10-CM | POA: Diagnosis not present

## 2020-10-14 NOTE — Telephone Encounter (Signed)
Spoke with patient. Advised as seen below per Dr. [name] Simmonds. Patient request consult with GYN ONC, referral placed. Advised their office will contact you directly with appt details once scheduled. Questions answered, patient thankful for call.   Routing to provider for final review. Patient is agreeable to disposition. Will close encounter.   Cc: Hayley Carder

## 2020-10-17 ENCOUNTER — Telehealth: Payer: Self-pay | Admitting: *Deleted

## 2020-10-17 ENCOUNTER — Other Ambulatory Visit: Payer: Self-pay | Admitting: Internal Medicine

## 2020-10-17 NOTE — Telephone Encounter (Signed)
Called and scheduled the patient for a new patient appt with Dr Berline Lopes on 12/3 at 1:30 pm. Patient given the address and phone number for the clinic. Patient also given the policy for mask and visitors

## 2020-10-25 DIAGNOSIS — J301 Allergic rhinitis due to pollen: Secondary | ICD-10-CM | POA: Diagnosis not present

## 2020-10-25 DIAGNOSIS — J3081 Allergic rhinitis due to animal (cat) (dog) hair and dander: Secondary | ICD-10-CM | POA: Diagnosis not present

## 2020-10-25 DIAGNOSIS — J3089 Other allergic rhinitis: Secondary | ICD-10-CM | POA: Diagnosis not present

## 2020-10-28 ENCOUNTER — Inpatient Hospital Stay: Payer: Medicare PPO | Attending: Gynecologic Oncology | Admitting: Gynecologic Oncology

## 2020-10-28 ENCOUNTER — Other Ambulatory Visit: Payer: Self-pay

## 2020-10-28 ENCOUNTER — Encounter: Payer: Self-pay | Admitting: Gynecologic Oncology

## 2020-10-28 VITALS — BP 122/73 | HR 76 | Temp 98.3°F | Resp 18 | Ht 62.0 in | Wt 133.0 lb

## 2020-10-28 DIAGNOSIS — M810 Age-related osteoporosis without current pathological fracture: Secondary | ICD-10-CM | POA: Diagnosis not present

## 2020-10-28 DIAGNOSIS — G20 Parkinson's disease: Secondary | ICD-10-CM | POA: Insufficient documentation

## 2020-10-28 DIAGNOSIS — D3911 Neoplasm of uncertain behavior of right ovary: Secondary | ICD-10-CM | POA: Insufficient documentation

## 2020-10-28 DIAGNOSIS — J449 Chronic obstructive pulmonary disease, unspecified: Secondary | ICD-10-CM | POA: Insufficient documentation

## 2020-10-28 DIAGNOSIS — Z853 Personal history of malignant neoplasm of breast: Secondary | ICD-10-CM | POA: Diagnosis not present

## 2020-10-28 DIAGNOSIS — Z90722 Acquired absence of ovaries, bilateral: Secondary | ICD-10-CM | POA: Diagnosis not present

## 2020-10-28 DIAGNOSIS — K219 Gastro-esophageal reflux disease without esophagitis: Secondary | ICD-10-CM | POA: Diagnosis not present

## 2020-10-28 DIAGNOSIS — I10 Essential (primary) hypertension: Secondary | ICD-10-CM | POA: Diagnosis not present

## 2020-10-28 DIAGNOSIS — Z90710 Acquired absence of both cervix and uterus: Secondary | ICD-10-CM | POA: Insufficient documentation

## 2020-10-28 DIAGNOSIS — Z7982 Long term (current) use of aspirin: Secondary | ICD-10-CM | POA: Diagnosis not present

## 2020-10-28 DIAGNOSIS — Z79899 Other long term (current) drug therapy: Secondary | ICD-10-CM | POA: Diagnosis not present

## 2020-10-28 NOTE — Patient Instructions (Signed)
It was a pleasure meeting you today. If you develop any new symptoms between now and our phone visit, please call me at [phone]. I am hopeful that your energy will improve with increasing your calorie intake.

## 2020-10-28 NOTE — Progress Notes (Signed)
GYNECOLOGIC ONCOLOGY NEW PATIENT CONSULTATION   Patient Name: Crystal Bass  Patient Age: 79 y.o. Date of Service: 10/28/20 Referring Provider: Dr. 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  I reviewed with the patient her diagnosis of a mucinous borderline ovarian tumor.  She is followed with her gynecologist regularly and has been undergoing surveillance with CA-125.  Given an increase in her baseline CA-125 several years ago, CT scan was obtained and negative for any evidence of recurrent disease.  Her new normal appears to fluctuate between the mid 20s and mid 30s.  She has had no significant change with her most recent value.  She denied discussed the limitations of CA-125.  While it may have been a tumor marker initially for her, given the decrease in level after surgery, I am suspicious that this was actually related to her endometriosis.  Her pathology at the time of surgery was also notable for an endometrioma.  I discussed that this is a common elevation of CA-125 in premenopausal patients.  Additionally, in the setting of a mucinous borderline tumor, I would expect CEA to have a been a better tumor marker if it had indeed been checked before surgery.  At this juncture, given the remoteness of her diagnosis, I think her risk of recurrence is quite low.  The risk of recurrence with malignancy, is almost negligible.  My recommendation is to discontinue surveillance with CA-125.  If the patient felt very strongly about continuing with this lab test, I would recommend obtaining it no more frequently than once a year.  The patient was understanding of the anxiety that fluctuations in the level could cause and that she has a number of medical diagnoses that could potentially contribute to these  fluctuations.  I acknowledge that some of her symptoms, notably her decreased appetite and fatigue, can be symptoms seen with ovarian cancer.  I suspect that her fatigue and low energy is related to her poor caloric intake.  Upon further discussion, she does not endorse early satiety, bloating, or other abdominal symptoms.  She and I discussed strategies to increase her caloric intake including snacking as well as protein shake supplementation.  She is going to work on these interventions over the next couple of months.  We will plan to have a phone visit in 3 months.  If her appetite improves with some work on her part and subsequently her fatigue also improves, then I do not think any further investigation is needed.  If she were to develop additional/new symptoms, or have worsening of her current symptoms, then we discussed possible need for CT scan evaluation.  A copy of this note was sent to the patient's referring provider.   50 minutes of total time was spent for this patient encounter, including preparation, face-to-face counseling with the patient and coordination of care, and documentation of the encounter.   Jeral Pinch, MD  Division of Gynecologic Oncology  Department of Obstetrics and Gynecology  [facility]  ___________________________________________  Chief Complaint: No chief complaint on file.   History of Present Illness:  Crystal Bass is a 79 y.o. y.o. female who is seen in consultation at the request of Dr. [name] Simmonds for an evaluation of CA-2025 fluctuations in the setting of a remote history of mucinous borderline tumor of the ovary.  Patient history is notable for borderline ovarian tumor diagnosed in 2004.  In May of that year, she underwent surgery including total hysterectomy, BSO, omentectomy, and appendectomy. Pathology revealed a mucinous borderline tumor of the right ovary, 5cm. Capsule was disrupted, no peritoneal implants  identified. Pelvic washings were negative.   Although I cannot see these in our records, she notes that her CA-125 was elevated at 52 before surgery and dropped to 12 after.  In our system, I can see the values of her CA-125 since 2014.  Her CA 125 was in the teens until 08/2017, when increased to 32.6. Since then, it has fluctuated between 25.9 and 35.1.  Her most recent value was 32.  She endorses feeling sleepy and fatigued.  She has very occasional right lower back and flank pain.  For the last 7-8 months, she endorses having decreased appetite.  She denies any bloating or early satiety, but notes not wanting to eat more very soon after starting to eat.  Her bowel function is at baseline, which for her can mean having a bowel movement only once or twice a week.  She denies any constipation or hard stool.  She denies any weight loss in the last couple of years.  Lives in independent living at [facility].  Has several close girlfriends who are also in the assisted living community.  PAST MEDICAL HISTORY:  Past Medical History:  Diagnosis Date  . 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 Aortic root aneurysm ([place])   . Arthritis   . Asthma   . Breast cancer ([place]) 2002   infiltrative ductal carcinoma   . Cataract 2019  . COPD (chronic obstructive pulmonary disease) ([place]) 9/08  . Ductal carcinoma of breast, estrogen receptor positive, stage 1 ([place]) 09/16/2012  . Endometriosis   . GERD (gastroesophageal reflux disease)   . Hypercalcemia 09/14/2013  . Hypertension   . Lesion of breast 1992   right, benign  . Lung disease    secondary to [name] infection  . Mastalgia 7/95  . Osteoporosis, post-menopausal 03/14/2012  . Ovarian tumor of borderline malignancy, right 2004  . Pancreatitis    secondary to Cholelithiasis  . Parkinson disease ([place]) 02/10/15  . Post-thoracotomy pain syndrome   . Pseudomonas pneumonia ([place]) 5/09  . Uterine fibroid   . Vitamin D deficiency       PAST SURGICAL HISTORY:  Past Surgical History:  Procedure Laterality Date  . 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 APPENDECTOMY  2004  . BREAST BIOPSY  08/2004   right breast-benign  . BREAST LUMPECTOMY  1992   benign  . CARDIAC CATHETERIZATION  2007   essentially negation for significant CAD  . CHOLECYSTECTOMY  2001  . COLONOSCOPY  2014   Dr [name] , due 2019  . COLONOSCOPY  09/2018   2 TA removed, int hem, no f/u needed (Dr [name])   . ESOPHAGOGASTRODUODENOSCOPY  09/2018   GERD with esophagitis and stricture dilated, antral gastritis negative for H pylori [name])  . LUNG BIOPSY  2002   [name], Dr [name]  . MASTECTOMY MODIFIED RADICAL Left 2002   oral chemotheraphy (tamoxifen then Armidex) no radiation, Dr.[name]  . TEE WITHOUT CARDIOVERSION N/A 12/29/2018   Procedure: TRANSESOPHAGEAL ECHOCARDIOGRAM (TEE);  Surgeon: Wellington Hampshire, MD;  Location: [facility] ORS;  Service: Cardiovascular;  Laterality: N/A;  . TONSILLECTOMY  1946  OB/GYN HISTORY:  OB History  Gravida Para Term Preterm AB Living  0 0 0 0 0 0  SAB TAB Ectopic Multiple Live Births  0 0 0 0      No LMP recorded. Patient has had a hysterectomy.  SCREENING STUDIES:  Last mammogram:2021  Last colonoscopy: 2021 Last bone mineral density: 2019  MEDICATIONS: Outpatient Encounter Medications as of 10/28/2020  Medication Sig  . acetaminophen (TYLENOL) 500 MG tablet Take 2 tablets (1,000 mg total) by mouth 2 (two) times daily as needed. (Patient taking differently: Take 1,000 mg by mouth 2 (two) times daily as needed for mild pain or moderate pain. )  . albuterol (VENTOLIN HFA) 108 (90 Base) MCG/ACT inhaler Inhale 2 puffs into the lungs every 6 (six) hours as needed for wheezing or shortness of breath.  [name] amLODipine (NORVASC) 2.5 MG tablet Take 1 tablet (2.5 mg total) by mouth daily.  [name] aspirin 81 MG tablet Take 1 tablet (81 mg total) by mouth daily.  .  Carbidopa-Levodopa ER (SINEMET CR) 25-100 MG tablet controlled release TAKE 2 TABLETS BY MOUTH IN THE MORNING, 1 TAB IN THE AFTERNOON AND 1 TAB IN THE EVENING  . clonazePAM (KLONOPIN) 0.5 MG tablet TAKE 1/2 A TABLET BY MOUTH AT BEDTIME  . EPINEPHrine 0.3 mg/0.3 mL IJ SOAJ injection   . gabapentin ([name]) 300 MG capsule TAKE 2 CAPSULES BY MOUTH 3 TIMES DAILY  . omeprazole (PRILOSEC) 20 MG capsule TAKE 1 CAPSULE BY MOUTH ONCE DAILY  . TRELEGY ELLIPTA 100-62.5-25 MCG/INH AEPB INHALE 1 PUFF BY MOUTH ONCE A DAY   No facility-administered encounter medications on file as of 10/28/2020.    ALLERGIES:  Allergies  Allergen Reactions  . Sulfonamide Derivatives Anaphylaxis  . Topamax [Topiramate] Other (See Comments)    Metabolic acidosis   . Biaxin [Clarithromycin] Other (See Comments)    pericarditis  . Hydrocodone Other (See Comments)    "hyper and climbing the walls"  . Motrin [Ibuprofen] Other (See Comments)    headaches  . Symbicort [Budesonide-Formoterol Fumarate] Other (See Comments)    02/07/15 tremor  . Percocet [Oxycodone-Acetaminophen] Itching and Rash  . Tape Itching and Rash    Use paper tape only  . Tetracyclines & Related Other (See Comments)    "immediate yeast infection"     FAMILY HISTORY:  Family History  Problem Relation Age of Onset  . Lung cancer Father 31       d.82 history of smoking  . Breast cancer Sister 36  . Colon cancer Sister   . Emphysema Mother        d.64 was never a smoker  . Stroke Paternal Aunt        in mid 59s  . Osteoporosis Paternal Aunt   . Melanoma Maternal Aunt   . Cancer Maternal Uncle        unspecified type  . Cancer Maternal Grandmother        d.82s unspecified GI cancer  . Cancer Maternal Grandfather        d.62s unspecified type  . Liver cancer Maternal Aunt   . Cancer Maternal Uncle        unspecified type  . Cancer Maternal Uncle        unspecified type  . Myasthenia gravis Paternal Aunt   . Breast cancer Cousin         d.60s-daughter of unaffected paternal aunt Eustace Moore  . Colon cancer Cousin 60       d.70-daughter of unaffected maternal  aunt Leila  . Lung cancer Cousin 70       d.70-sisters to each other, both daughters of maternal uncle Johnny  . Diabetes Neg Hx   . Heart disease Neg Hx   . Stomach cancer Neg Hx   . Ulcerative colitis Neg Hx      SOCIAL HISTORY:    Social Connections:   . Frequency of Communication with Friends and Family: Not on file  . Frequency of Social Gatherings with Friends and Family: Not on file  . Attends Religious Services: Not on file  . Active Member of Clubs or Organizations: Not on file  . Attends Archivist Meetings: Not on file  . Marital Status: Not on file    REVIEW OF SYSTEMS:  + fatigue, decreased appetite, shortness of breath Denies fevers, chills, unexplained weight changes. Denies hearing loss, neck lumps or masses, mouth sores, ringing in ears or voice changes. Denies cough or wheezing.   Denies chest pain or palpitations. Denies leg swelling. Denies abdominal distention, pain, blood in stools, constipation, diarrhea, nausea, vomiting, or early satiety. Denies pain with intercourse, dysuria, frequency, hematuria or incontinence. Denies hot flashes, pelvic pain, vaginal bleeding or vaginal discharge.   Denies joint pain, back pain or muscle pain/cramps. Denies itching, rash, or wounds. Denies dizziness, headaches, numbness or seizures. Denies swollen lymph nodes or glands, denies easy bruising or bleeding. Denies anxiety, depression, confusion, or decreased concentration.  Physical Exam:  Vital Signs for this encounter:  Blood pressure 122/73, pulse 76, temperature 98.3 F (36.8 C), temperature source Tympanic, resp. rate 18, height 5\' 2"  (1.575 m), weight 133 lb (60.3 kg), SpO2 100 %. Body mass index is 24.33 kg/m. General: Alert, oriented, no acute distress.  HEENT: Normocephalic, atraumatic. Sclera anicteric.  Chest: Unlabored  breathing on room air.  LABORATORY AND RADIOLOGIC DATA:  Outside medical records were reviewed to synthesize the above history, along with the history and physical obtained during the visit.   Lab Results  Component Value Date   WBC 5.9 12/23/2018   HGB 13.7 12/23/2018   HCT 40.5 12/23/2018   PLT 167 12/23/2018   GLUCOSE 84 08/03/2020   CHOL 176 01/22/2020   TRIG 126.0 01/22/2020   HDL 48.50 01/22/2020   LDLDIRECT 135.0 08/16/2009   LDLCALC 102 (H) 01/22/2020   ALT 1 01/22/2020   AST 12 01/22/2020   NA 138 08/03/2020   K 3.5 08/03/2020   CL 102 08/03/2020   CREATININE 0.71 08/03/2020   BUN 14 08/03/2020   CO2 29 08/03/2020   TSH 1.893 10/26/2018   INR 1.00 10/25/2018   HGBA1C 5.3 01/14/2018   MICROALBUR 7.1 (H) 01/22/2020

## 2020-11-01 DIAGNOSIS — J301 Allergic rhinitis due to pollen: Secondary | ICD-10-CM | POA: Diagnosis not present

## 2020-11-01 DIAGNOSIS — J3089 Other allergic rhinitis: Secondary | ICD-10-CM | POA: Diagnosis not present

## 2020-11-15 ENCOUNTER — Telehealth: Payer: Self-pay | Admitting: Family Medicine

## 2020-11-15 DIAGNOSIS — M810 Age-related osteoporosis without current pathological fracture: Secondary | ICD-10-CM

## 2020-11-15 NOTE — Telephone Encounter (Signed)
First prolia injection was 05/26/2020. plz schedule next one for pt.  Do recommend restarting b12 shot - looks like last one was 07/2020. We can recheck levels next lab visit.  Next physical will be due after 01/26/2021.   Lab Results  Component Value Date   [id number] 01/22/2020

## 2020-11-15 NOTE — Telephone Encounter (Signed)
Patient called in in regards to see when she should get her 2nd prolia shot. Stated she was supposed to in December but unaware when to get it done or if labs needed. Pt also stated she has not done her b-12 shots in some time and is wondering if she can just start again or labs first? Please advise.

## 2020-11-16 NOTE — Telephone Encounter (Signed)
Advised pt could not schedule for prolia until benefits were run and benefits cannot be run until new year. Advised this office will f/u in Jan 2022. Advised she can restart B12. Made 3 monthly B12 NV for pt. Advised for her to make annual cpe after 01/26/21 when she comes in for NV. Pt verbalized understanding.

## 2020-11-17 DIAGNOSIS — J00 Acute nasopharyngitis [common cold]: Secondary | ICD-10-CM | POA: Diagnosis not present

## 2020-11-17 DIAGNOSIS — Z20822 Contact with and (suspected) exposure to covid-19: Secondary | ICD-10-CM | POA: Diagnosis not present

## 2020-11-22 DIAGNOSIS — J3089 Other allergic rhinitis: Secondary | ICD-10-CM | POA: Diagnosis not present

## 2020-11-22 DIAGNOSIS — J301 Allergic rhinitis due to pollen: Secondary | ICD-10-CM | POA: Diagnosis not present

## 2020-11-22 DIAGNOSIS — J4530 Mild persistent asthma, uncomplicated: Secondary | ICD-10-CM | POA: Diagnosis not present

## 2020-11-22 DIAGNOSIS — J3081 Allergic rhinitis due to animal (cat) (dog) hair and dander: Secondary | ICD-10-CM | POA: Diagnosis not present

## 2020-11-22 DIAGNOSIS — T783XXA Angioneurotic edema, initial encounter: Secondary | ICD-10-CM | POA: Diagnosis not present

## 2020-11-24 ENCOUNTER — Ambulatory Visit (INDEPENDENT_AMBULATORY_CARE_PROVIDER_SITE_OTHER): Payer: Medicare PPO

## 2020-11-24 DIAGNOSIS — E538 Deficiency of other specified B group vitamins: Secondary | ICD-10-CM | POA: Diagnosis not present

## 2020-11-24 MED ORDER — CYANOCOBALAMIN 1000 MCG/ML IJ SOLN
1000.0000 ug | Freq: Once | INTRAMUSCULAR | Status: AC
  Administered 2020-11-24: 1000 ug via INTRAMUSCULAR

## 2020-11-24 NOTE — Progress Notes (Signed)
Per orders of Dr. Cody, injection of vit B12 given by Zyia Kaneko. Patient tolerated injection well.  

## 2020-11-29 DIAGNOSIS — J301 Allergic rhinitis due to pollen: Secondary | ICD-10-CM | POA: Diagnosis not present

## 2020-11-29 DIAGNOSIS — J3089 Other allergic rhinitis: Secondary | ICD-10-CM | POA: Diagnosis not present

## 2020-11-29 DIAGNOSIS — J3081 Allergic rhinitis due to animal (cat) (dog) hair and dander: Secondary | ICD-10-CM | POA: Diagnosis not present

## 2020-12-06 ENCOUNTER — Other Ambulatory Visit: Payer: Self-pay | Admitting: Family Medicine

## 2020-12-06 NOTE — Telephone Encounter (Signed)
Please Advise

## 2020-12-07 ENCOUNTER — Other Ambulatory Visit: Payer: Self-pay | Admitting: Primary Care

## 2020-12-07 DIAGNOSIS — J449 Chronic obstructive pulmonary disease, unspecified: Secondary | ICD-10-CM

## 2020-12-07 NOTE — Telephone Encounter (Signed)
ERx 

## 2020-12-17 NOTE — Telephone Encounter (Signed)
Last prolia inj was 05/26/20. Next prolia due after 11/27/20. Need to run benefits first and do any PA necessary and then labs and NV can be scheduled for inj. Sending to [facility] to run benefits and inquire if PA is needed.

## 2020-12-19 ENCOUNTER — Other Ambulatory Visit: Payer: Self-pay | Admitting: Neurology

## 2020-12-19 NOTE — Telephone Encounter (Signed)
Dr. Carles Collet patient

## 2020-12-20 DIAGNOSIS — J3089 Other allergic rhinitis: Secondary | ICD-10-CM | POA: Diagnosis not present

## 2020-12-20 DIAGNOSIS — J301 Allergic rhinitis due to pollen: Secondary | ICD-10-CM | POA: Diagnosis not present

## 2020-12-22 NOTE — Telephone Encounter (Signed)
Benefits pending will review with rep once received.

## 2020-12-23 ENCOUNTER — Telehealth: Payer: Self-pay | Admitting: Cardiovascular Disease

## 2020-12-23 NOTE — Telephone Encounter (Signed)
3 attempts to schedule fu appt from recall list.   Deleting recall.   

## 2020-12-26 NOTE — Telephone Encounter (Signed)
Per Rep Josem Kaufmann was needed. Started on Cover my meds.   Venetia Constable Key: [id number] - PA Case ID: [id number] Need help? Call us at [phone] Status Sent to [place] 60MG /ML syringes [place] and Medical Benefit PA Form

## 2020-12-27 ENCOUNTER — Ambulatory Visit (INDEPENDENT_AMBULATORY_CARE_PROVIDER_SITE_OTHER): Payer: Medicare PPO

## 2020-12-27 ENCOUNTER — Other Ambulatory Visit: Payer: Self-pay

## 2020-12-27 DIAGNOSIS — J3089 Other allergic rhinitis: Secondary | ICD-10-CM | POA: Diagnosis not present

## 2020-12-27 DIAGNOSIS — E538 Deficiency of other specified B group vitamins: Secondary | ICD-10-CM | POA: Diagnosis not present

## 2020-12-27 DIAGNOSIS — J301 Allergic rhinitis due to pollen: Secondary | ICD-10-CM | POA: Diagnosis not present

## 2020-12-27 DIAGNOSIS — J3081 Allergic rhinitis due to animal (cat) (dog) hair and dander: Secondary | ICD-10-CM | POA: Diagnosis not present

## 2020-12-27 MED ORDER — CYANOCOBALAMIN 1000 MCG/ML IJ SOLN
1000.0000 ug | Freq: Once | INTRAMUSCULAR | Status: AC
  Administered 2020-12-27: 1000 ug via INTRAMUSCULAR

## 2020-12-27 NOTE — Progress Notes (Signed)
Per orders of Dr.Gutierrez, injection of monthly B12 given by Beatriz Stallion in rt deltoid Patient tolerated injection well.

## 2020-12-28 NOTE — Telephone Encounter (Signed)
Crystal Bass has been received and benefits have been ran looks like should only be $35 dollar admin fee but pending follow up from [place] to verify. Message sent 12/28/2020 for her to review.

## 2020-12-28 NOTE — Telephone Encounter (Signed)
Auth received : Venetia Constable Key: [id number] - PA Case ID: [id number] Need help? Call us at [phone] Outcome Approvedon January 31 PA Case: [id number], Status: Approved, Coverage Starts on: 05/06/2020 12:00:00 AM, Coverage Ends on: 11/25/2021 12:00:00 AM. Questions? Contact [phone]. Drug Prolia 60MG /ML syringes Form Nurse, adult and Medical Benefit PA Form

## 2020-12-28 NOTE — Telephone Encounter (Signed)
Verified with rep. Patient is covered and will have a $35 admin fee only.

## 2021-01-04 NOTE — Addendum Note (Signed)
Addended by: Randall An on: 01/04/2021 05:40 PM   Modules accepted: Orders

## 2021-01-04 NOTE — Telephone Encounter (Signed)
Advised pt of cost and scheduled lab for 2/10and NV for inj on 2/16 for Prolia.  Pt verbalized understanding.   Added lab order

## 2021-01-04 NOTE — Telephone Encounter (Signed)
LVM  Need to give pricing, schedule for lab and NV.

## 2021-01-05 ENCOUNTER — Other Ambulatory Visit (INDEPENDENT_AMBULATORY_CARE_PROVIDER_SITE_OTHER): Payer: Medicare PPO

## 2021-01-05 ENCOUNTER — Other Ambulatory Visit: Payer: Self-pay

## 2021-01-05 DIAGNOSIS — M810 Age-related osteoporosis without current pathological fracture: Secondary | ICD-10-CM

## 2021-01-06 LAB — BASIC METABOLIC PANEL
BUN: 20 mg/dL (ref 6–23)
CO2: 28 mEq/L (ref 19–32)
Calcium: 9.1 mg/dL (ref 8.4–10.5)
Chloride: 104 mEq/L (ref 96–112)
Creatinine, Ser: 0.94 mg/dL (ref 0.40–1.20)
GFR: 58.4 mL/min — ABNORMAL LOW (ref >60.00)
Glucose, Bld: 99 mg/dL (ref 70–99)
Potassium: 3.5 mEq/L (ref 3.5–5.1)
Sodium: 141 mEq/L (ref 135–145)

## 2021-01-06 NOTE — Telephone Encounter (Signed)
Ca normal and CrCl is 47.71. Ok for inj.

## 2021-01-10 DIAGNOSIS — J3081 Allergic rhinitis due to animal (cat) (dog) hair and dander: Secondary | ICD-10-CM | POA: Diagnosis not present

## 2021-01-10 DIAGNOSIS — J3089 Other allergic rhinitis: Secondary | ICD-10-CM | POA: Diagnosis not present

## 2021-01-10 DIAGNOSIS — J301 Allergic rhinitis due to pollen: Secondary | ICD-10-CM | POA: Diagnosis not present

## 2021-01-11 ENCOUNTER — Other Ambulatory Visit: Payer: Self-pay

## 2021-01-11 ENCOUNTER — Ambulatory Visit (INDEPENDENT_AMBULATORY_CARE_PROVIDER_SITE_OTHER): Payer: Medicare PPO

## 2021-01-11 DIAGNOSIS — M810 Age-related osteoporosis without current pathological fracture: Secondary | ICD-10-CM | POA: Diagnosis not present

## 2021-01-11 MED ORDER — DENOSUMAB 60 MG/ML ~~LOC~~ SOSY
60.0000 mg | PREFILLED_SYRINGE | Freq: Once | SUBCUTANEOUS | Status: AC
  Administered 2021-01-11: 60 mg via SUBCUTANEOUS

## 2021-01-11 NOTE — Progress Notes (Signed)
Per orders of Dr. [name], injection of Prolia, given in right arm, by Loreen Freud. Patient tolerated injection well.

## 2021-01-13 ENCOUNTER — Other Ambulatory Visit: Payer: Self-pay | Admitting: Internal Medicine

## 2021-01-16 ENCOUNTER — Telehealth: Payer: Self-pay | Admitting: Internal Medicine

## 2021-01-16 MED ORDER — OMEPRAZOLE 20 MG PO CPDR: 20.0000 mg | DELAYED_RELEASE_CAPSULE | Freq: Every day | ORAL | 1 refills | Status: DC

## 2021-01-16 NOTE — Telephone Encounter (Signed)
Called patient to verify pharmacy. She would like refill sent to [facility].  Refill sent to get to April appointment with Dr. [name]

## 2021-01-26 DIAGNOSIS — J3089 Other allergic rhinitis: Secondary | ICD-10-CM | POA: Diagnosis not present

## 2021-01-26 DIAGNOSIS — J301 Allergic rhinitis due to pollen: Secondary | ICD-10-CM | POA: Diagnosis not present

## 2021-01-30 ENCOUNTER — Encounter: Payer: Self-pay | Admitting: Gynecologic Oncology

## 2021-01-30 ENCOUNTER — Other Ambulatory Visit: Payer: Self-pay

## 2021-01-30 ENCOUNTER — Inpatient Hospital Stay: Payer: Medicare PPO | Attending: Gynecologic Oncology

## 2021-01-30 ENCOUNTER — Inpatient Hospital Stay (HOSPITAL_BASED_OUTPATIENT_CLINIC_OR_DEPARTMENT_OTHER): Payer: Medicare PPO | Admitting: Gynecologic Oncology

## 2021-01-30 VITALS — BP 124/73 | HR 77 | Temp 97.2°F | Resp 20 | Ht 62.5 in | Wt 137.0 lb

## 2021-01-30 DIAGNOSIS — C50912 Malignant neoplasm of unspecified site of left female breast: Secondary | ICD-10-CM | POA: Insufficient documentation

## 2021-01-30 DIAGNOSIS — J449 Chronic obstructive pulmonary disease, unspecified: Secondary | ICD-10-CM | POA: Diagnosis not present

## 2021-01-30 DIAGNOSIS — K219 Gastro-esophageal reflux disease without esophagitis: Secondary | ICD-10-CM | POA: Diagnosis not present

## 2021-01-30 DIAGNOSIS — Z9012 Acquired absence of left breast and nipple: Secondary | ICD-10-CM | POA: Diagnosis not present

## 2021-01-30 DIAGNOSIS — Z170 Estrogen receptor positive status [ER+]: Secondary | ICD-10-CM | POA: Diagnosis not present

## 2021-01-30 DIAGNOSIS — Z7982 Long term (current) use of aspirin: Secondary | ICD-10-CM | POA: Diagnosis not present

## 2021-01-30 DIAGNOSIS — Z79899 Other long term (current) drug therapy: Secondary | ICD-10-CM | POA: Diagnosis not present

## 2021-01-30 DIAGNOSIS — I10 Essential (primary) hypertension: Secondary | ICD-10-CM | POA: Insufficient documentation

## 2021-01-30 DIAGNOSIS — D3911 Neoplasm of uncertain behavior of right ovary: Secondary | ICD-10-CM | POA: Diagnosis not present

## 2021-01-30 DIAGNOSIS — G20 Parkinson's disease: Secondary | ICD-10-CM | POA: Insufficient documentation

## 2021-01-30 DIAGNOSIS — M810 Age-related osteoporosis without current pathological fracture: Secondary | ICD-10-CM | POA: Insufficient documentation

## 2021-01-30 DIAGNOSIS — F419 Anxiety disorder, unspecified: Secondary | ICD-10-CM | POA: Insufficient documentation

## 2021-01-30 NOTE — Patient Instructions (Signed)
I will call or release your CA-125 to you in mychart. If the number has significantly increased, then we will talk about the need for imaging.

## 2021-01-30 NOTE — Progress Notes (Signed)
Gynecologic Oncology Return Clinic Visit  01/30/2021  Reason for Visit: Follow-up recent visit regarding decreased appetite, fatigue  Treatment History: Oncology History  Breast cancer of upper-outer quadrant of left female breast ([place]) (Resolved)  12/09/2000 Surgery   Left breast modified radical mastectomy stage I ER positive   01/07/2001 - 01/15/2011 Anti-estrogen oral therapy   Tamoxifen later transitioned to aromatase inhibitor    PET scan   Lung nodule: Mycobacterium avium tuberculosis treated 2-3 occasions by Dr. Chase Caller   04/15/2003 Surgery   Hysterectomy plus oophorectomy for low-grade ovarian cancer     Interval History: Patient reports no significant change since her last visit.  Her appetite is decreased although has not decreased any further over the last 3 months.  On her home scale, she denies any significant change to her weight.  She continues to have normal bowel function which she describes as a bowel movement a couple of times a week which is soft.  She denies any urinary symptoms as well as nausea, vomiting, or bloating.  Her energy level is about the same.  She notes that her COPD and difficulty with breathing make it hard for her to do some of the activities that she would want to do.  Past Medical/Surgical History: Past Medical History:  Diagnosis Date  . 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 Aortic root aneurysm ([place])   . Arthritis   . Asthma   . Breast cancer ([place]) 2002   infiltrative ductal carcinoma   . Cataract 2019  . COPD (chronic obstructive pulmonary disease) ([place]) 9/08  . Ductal carcinoma of breast, estrogen receptor positive, stage 1 ([place]) 09/16/2012  . Endometriosis   . GERD (gastroesophageal reflux disease)   . Hypercalcemia 09/14/2013  . Hypertension   . Lesion of breast 1992   right, benign  . Lung disease    secondary to [name] infection  . Mastalgia 7/95  . Osteoporosis, post-menopausal 03/14/2012  .  Ovarian tumor of borderline malignancy, right 2004  . Pancreatitis    secondary to Cholelithiasis  . Parkinson disease ([place]) 02/10/15  . Post-thoracotomy pain syndrome   . Pseudomonas pneumonia ([place]) 5/09  . Uterine fibroid   . Vitamin D deficiency     Past Surgical History:  Procedure Laterality Date  . 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 APPENDECTOMY  2004  . BREAST BIOPSY  08/2004   right breast-benign  . BREAST LUMPECTOMY  1992   benign  . CARDIAC CATHETERIZATION  2007   essentially negation for significant CAD  . CHOLECYSTECTOMY  2001  . COLONOSCOPY  2014   Dr [name] , due 2019  . COLONOSCOPY  09/2018   2 TA removed, int hem, no f/u needed (Dr [name])   . ESOPHAGOGASTRODUODENOSCOPY  09/2018   GERD with esophagitis and stricture dilated, antral gastritis negative for H pylori [name])  . LUNG BIOPSY  2002   [name], Dr [name]  . MASTECTOMY MODIFIED RADICAL Left 2002   oral chemotheraphy (tamoxifen then Armidex) no radiation, Dr.[name]  . TEE WITHOUT CARDIOVERSION N/A 12/29/2018   Procedure: TRANSESOPHAGEAL ECHOCARDIOGRAM (TEE);  Surgeon: Wellington Hampshire, MD;  Location: [facility] ORS;  Service: Cardiovascular;  Laterality: N/A;  . TONSILLECTOMY  1946    Family History  Problem Relation Age of Onset  . Lung cancer Father 41       d.82 history of smoking  .  Breast cancer Sister 25  . Colon cancer Sister   . Emphysema Mother        d.64 was never a smoker  . Stroke Paternal Aunt        in mid 66s  . Osteoporosis Paternal Aunt   . Melanoma Maternal Aunt   . Cancer Maternal Uncle        unspecified type  . Cancer Maternal Grandmother        d.82s unspecified GI cancer  . Cancer Maternal Grandfather        d.62s unspecified type  . Liver cancer Maternal Aunt   . Cancer Maternal Uncle        unspecified type  . Cancer Maternal Uncle        unspecified type  . Myasthenia gravis Paternal Aunt   . Breast cancer Cousin         d.60s-daughter of unaffected paternal aunt Eustace Moore  . Colon cancer Cousin 30       d.70-daughter of unaffected maternal aunt Leila  . Lung cancer Cousin 70       d.70-sisters to each other, both daughters of maternal uncle Johnny  . Diabetes Neg Hx   . Heart disease Neg Hx   . Stomach cancer Neg Hx   . Ulcerative colitis Neg Hx     Social History   Socioeconomic History  . Marital status: Widowed    Spouse name: Not on file  . Number of children: 0  . Years of education: Not on file  . Highest education level: Not on file  Occupational History  . 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 Smoking status: Never Smoker  . Smokeless tobacco: Never Used  Vaping Use  . Vaping Use: Never used  Substance and Sexual Activity  . Alcohol use: No    Alcohol/week: 0.0 standard drinks  . Drug use: No  . Sexual activity: Not Currently    Birth control/protection: Surgical    Comment: TAH/BSO  Other Topics Concern  . 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  acetaminophen (TYLENOL) 500 MG tablet, Take 2 tablets (1,000 mg total) by mouth 2 (two) times daily as needed. (Patient taking differently: Take 1,000 mg by mouth 2 (two) times daily as needed for mild pain or moderate pain.), Disp: , Rfl:  .  albuterol (VENTOLIN HFA) 108 (90 Base) MCG/ACT inhaler, Inhale 2 puffs into the lungs  every 6 (six) hours as needed for wheezing or shortness of breath., Disp: 18 g, Rfl: 1 .  amLODipine (NORVASC) 2.5 MG tablet, Take 1 tablet (2.5 mg total) by mouth daily., Disp: 30 tablet, Rfl: 6 .  aspirin 81 MG tablet, Take 1 tablet (81 mg total) by mouth daily., Disp: 30 tablet, Rfl:  .  Carbidopa-Levodopa ER (SINEMET CR) 25-100 MG tablet controlled release, TAKE 2 TABLETS  BY MOUTH IN THE MORNING, 1 TAB IN THE AFTERNOON AND 1 TAB IN THE EVENING, Disp: 360 tablet, Rfl: 1 .  clonazePAM (KLONOPIN) 0.5 MG tablet, TAKE 1/2 A TABLET BY MOUTH AT BEDTIME, Disp: 45 tablet, Rfl: 1 .  EPINEPHrine 0.3 mg/0.3 mL IJ SOAJ injection, , Disp: , Rfl:  .  gabapentin ([name]) 300 MG capsule, TAKE 2 CAPSULES BY MOUTH 3 TIMES DAILY, Disp: 540 capsule, Rfl: 1 .  omeprazole (PRILOSEC) 20 MG capsule, Take 1 capsule (20 mg total) by mouth daily. Please keep your April appointment with Dr. [name] for further refills. Thank you, Disp: 30 capsule, Rfl: 1 .  TRELEGY ELLIPTA 100-62.5-25 MCG/INH AEPB, INHALE 1 PUFF BY MOUTH ONCE A DAY, Disp: 60 each, Rfl: 5  Review of Systems: + Voice changes, vision problems Denies appetite changes, fevers, chills, fatigue, unexplained weight changes. Denies hearing loss, neck lumps or masses, mouth sores, ringing in ears. Denies cough or wheezing.  Denies shortness of breath. Denies chest pain or palpitations. Denies leg swelling. Denies abdominal distention, pain, blood in stools, constipation, diarrhea, nausea, vomiting, or early satiety. Denies pain with intercourse, dysuria, frequency, hematuria or incontinence. Denies hot flashes, pelvic pain, vaginal bleeding or vaginal discharge.   Denies joint pain, back pain or muscle pain/cramps. Denies itching, rash, or wounds. Denies dizziness, headaches, numbness or seizures. Denies swollen lymph nodes or glands, denies easy bruising or bleeding. Denies anxiety, depression, confusion, or decreased concentration.  Physical Exam: BP  124/73 (BP Location: Right Arm, Patient Position: Sitting)   Pulse 77   Temp (!) 97.2 F (36.2 C) (Tympanic)   Resp 20   Ht 5' 2.5" (1.588 m)   Wt 137 lb (62.1 kg)   SpO2 100% Comment: RA  BMI 24.66 kg/m  General: Alert, oriented, no acute distress. HEENT: Normocephalic, atraumatic, sclera anicteric. Chest: Unlabored breathing on room air.  Laboratory & Radiologic Studies: None new  Assessment & Plan: Crystal Bass is a 80 y.o. 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  Patient continues to have similar symptoms that she did when I saw her 3 months ago.  They have not changed significantly and she has not had any weight loss.  We had talked last time about ways to increase her protein and calorie intake despite her decreased appetite but she has not tried any of these.  We discussed them again including either nutritional shakes, or making her own protein shakes.  We reviewed again her diagnosis of a mucinous borderline tumor and that her risk of recurrence is exceptionally low given that she is so far out from this diagnosis.  I again stressed that she did not have a diagnosis of malignancy.  While we had discussed the limitations of CA-125, she continues to be quite worried that her symptoms are related to recurrence of her borderline tumor or now malignancy.  I had strongly recommended either not following CA-125 or following at most once a year.  Given the patient's significant anxiety, I offered to get a CA-125 level today.  If this is similar to what it has been over the last number of years, then I would not recommend any imaging unless the patient were to have new or worsening of her current symptoms.  I reviewed with the patient her diagnosis of a mucinous borderline ovarian tumor.  She is followed with her gynecologist regularly and has been undergoing surveillance with CA-125.  Given an increase  in her baseline  CA-125 several years ago, CT scan was obtained and negative for any evidence of recurrent disease.  Her new normal appears to fluctuate between the mid 20s and mid 30s.  She has had no significant change with her most recent value.  She denied discussed the limitations of CA-125.  While it may have been a tumor marker initially for her, given the decrease in level after surgery, I am suspicious that this was actually related to her endometriosis.  Her pathology at the time of surgery was also notable for an endometrioma.  I discussed that this is a common elevation of CA-125 in premenopausal patients.  Additionally, in the setting of a mucinous borderline tumor, I would expect CEA to have a been a better tumor marker if it had indeed been checked before surgery.  If her CA-125 remains within the range that it has been, then I am going to release her back to her gynecologist for continued care.  I would not recommend further surveillance with lab testing and would only get tumor markers and/or imaging if symptoms arose that were concerning.  28 minutes of total time was spent for this patient encounter, including preparation, face-to-face counseling with the patient and coordination of care, and documentation of the encounter.  Jeral Pinch, MD  Division of Gynecologic Oncology  Department of Obstetrics and Gynecology  [facility]

## 2021-01-31 DIAGNOSIS — J301 Allergic rhinitis due to pollen: Secondary | ICD-10-CM | POA: Diagnosis not present

## 2021-01-31 DIAGNOSIS — J3081 Allergic rhinitis due to animal (cat) (dog) hair and dander: Secondary | ICD-10-CM | POA: Diagnosis not present

## 2021-01-31 DIAGNOSIS — J3089 Other allergic rhinitis: Secondary | ICD-10-CM | POA: Diagnosis not present

## 2021-01-31 LAB — CA 125: Cancer Antigen (CA) 125: 35.8 U/mL (ref 0.0–38.1)

## 2021-02-02 NOTE — Progress Notes (Signed)
Called the patient to review recent CA 125 value. No answer. Left a voicemail with callback requested.  Valarie Cones MD

## 2021-02-03 ENCOUNTER — Telehealth: Payer: Self-pay

## 2021-02-03 NOTE — Telephone Encounter (Signed)
LM for Crystal Bass regarding the CA-125 value from 01-30-21 being 35.8.  Read the My Chart message that Dr. Berline Lopes sent her as noted below and to review on My Chart as well.  Told her to call the office Monday 02-06-21 to let Dr. Berline Lopes know haw she would like to proceed and or discuss any questions or concerns:  Crystal Bass, I called and left you a message yesterday to discuss CA-125 results. Overall, minimal change from last value. You have ranged from 28-35 over the last 3 years. My recommendation would not be for imaging at this time unless something changed. If you feel strongly that you continue to be concerned and want imaging, we can discuss how best to get some imaging to reassure you. Moving forward though, I will recommend to your gynecologist and PCP that CA-125 not continue to be followed and that we use only your symptoms or findings on physical exam to make decisions about getting imaging.  Please let me know how you'd like to proceed. Best, Dr. Berline Lopes

## 2021-02-10 DIAGNOSIS — J3089 Other allergic rhinitis: Secondary | ICD-10-CM | POA: Diagnosis not present

## 2021-02-10 DIAGNOSIS — J301 Allergic rhinitis due to pollen: Secondary | ICD-10-CM | POA: Diagnosis not present

## 2021-02-21 DIAGNOSIS — J3089 Other allergic rhinitis: Secondary | ICD-10-CM | POA: Diagnosis not present

## 2021-02-21 DIAGNOSIS — J301 Allergic rhinitis due to pollen: Secondary | ICD-10-CM | POA: Diagnosis not present

## 2021-02-22 ENCOUNTER — Other Ambulatory Visit: Payer: Self-pay | Admitting: Family Medicine

## 2021-02-27 ENCOUNTER — Ambulatory Visit (INDEPENDENT_AMBULATORY_CARE_PROVIDER_SITE_OTHER)
Admission: RE | Admit: 2021-02-27 | Discharge: 2021-02-27 | Disposition: A | Discharge status: Home / Self Care | Payer: Medicare PPO | Source: Ambulatory Visit | Attending: Family Medicine | Admitting: Family Medicine

## 2021-02-27 ENCOUNTER — Telehealth: Payer: Self-pay

## 2021-02-27 ENCOUNTER — Encounter: Payer: Self-pay | Admitting: Family Medicine

## 2021-02-27 ENCOUNTER — Ambulatory Visit
Admission: RE | Admit: 2021-02-27 | Discharge: 2021-02-27 | Disposition: A | Discharge status: Home / Self Care | Payer: Medicare PPO | Source: Ambulatory Visit | Attending: Family Medicine | Admitting: Family Medicine

## 2021-02-27 ENCOUNTER — Other Ambulatory Visit: Payer: Self-pay

## 2021-02-27 ENCOUNTER — Ambulatory Visit: Payer: Medicare PPO | Admitting: Family Medicine

## 2021-02-27 VITALS — BP 140/82 | HR 73 | Temp 98.3°F | Ht 62.5 in | Wt 133.5 lb

## 2021-02-27 DIAGNOSIS — S8990XA Unspecified injury of unspecified lower leg, initial encounter: Secondary | ICD-10-CM

## 2021-02-27 DIAGNOSIS — M778 Other enthesopathies, not elsewhere classified: Secondary | ICD-10-CM | POA: Diagnosis not present

## 2021-02-27 DIAGNOSIS — M25562 Pain in left knee: Secondary | ICD-10-CM | POA: Diagnosis not present

## 2021-02-27 DIAGNOSIS — M7989 Other specified soft tissue disorders: Secondary | ICD-10-CM | POA: Diagnosis not present

## 2021-02-27 NOTE — Telephone Encounter (Signed)
[place] Night - Client TELEPHONE ADVICE RECORD [facility] Patient Name: Crystal Bass Gender: Female DOB: Apr 04, 1941 Age: 80 Y 11 M 17 D Return Phone Number: [phone] (Primary) Address: City/ State/ Zip: [place] [address] Client [place] Night - Client Client Site [place] Physician [name] - MD Contact Type Call Who Is Calling Patient / Member / Family / Caregiver Call Type Triage / Clinical Relationship To Patient Self Return Phone Number [phone] (Primary) Chief Complaint Knee Injury Reason for Call Symptomatic / Request for [place] states, pt fell days ago and has knee injury. PT has a knot below knee cap ? Translation No Nurse Assessment Nurse: Clovis Riley, RN, Georgina Peer Date/Time (Eastern Time): 02/27/2021 9:21:06 AM Confirm and document reason for call. If symptomatic, describe symptoms. ---caller states she fell on march 16th and injured both knees. The left knee has knot below her kneecap and below that is a hematoma that is 5inx3in. States it is painful to the touch. She can walk on her leg. Right leg hurts worse to put weight on it. She is taking tylenol once a day now. Does the patient have any new or worsening symptoms? ---Yes Will a triage be completed? ---Yes Related visit to physician within the last 2 weeks? ---No Does the PT have any chronic conditions? (i.e. diabetes, asthma, this includes High risk factors for pregnancy, etc.) ---Yes List chronic conditions. ---HTN, parkinsons, Hx of breast cancer. Is this a behavioral health or substance abuse call? ---No Guidelines Guideline Title Affirmed Question Affirmed Notes Nurse Date/Time (Eastern Time) Knee Injury [1] High-risk adult (e.g., age > 41 years, osteoporosis, chronic steroid use) AND [2] limping Clovis Riley, RNGeorgina Peer 02/27/2021 9:25:37 AM Disp. Time  [name] Time) Disposition Final User PLEASE NOTE: All timestamps contained within this report are represented as [place] Standard Time. CONFIDENTIALTY NOTICE: This fax transmission is intended only for the addressee. It contains information that is legally privileged, confidential or otherwise protected from use or disclosure. If you are not the intended recipient, you are strictly prohibited from reviewing, disclosing, copying using or disseminating any of this information or taking any action in reliance on or regarding this information. If you have received this fax in error, please notify us immediately by telephone so that we can arrange for its return to [place]. Phone: [phone], Toll-Free: [phone], Fax: [phone] Page: 2 of 3 Call Id: [id number] 02/27/2021 9:27:42 AM See PCP within 24 Hours Yes Clovis Riley, RN, Leilani Merl Disagree/Comply Comply Caller Understands Yes PreDisposition Did not know what to do Care Advice Given Per Guideline SEE PCP WITHIN 24 HOURS: * IF OFFICE WILL BE OPEN: You need to be examined within the next 24 hours. Call your doctor (or NP/PA) when the office opens and make an appointment. CRUTCHES: * Try not to put any weight on the injured leg until evaluated. * Obtain a pair of crutches from the local pharmacy. USE HEAT ON AREA AFTER 48 HOURS: * If pain, swelling, or bruising last more than 48 hours (2 days), then use heat on the area. * This will help increase blood flow and improve healing. * Do this for 10 minutes three times a day. * Caution: burn. Do not sleep on a heating pad. * ACETAMINOPHEN - EXTRA STRENGTH TYLENOL: Take 1,000 mg (two 500 mg pills) every 8 hours as needed. Each Extra Strength Tylenol pill has 500 mg of acetaminophen. The most you should take each  day is 3,000 mg (6 pills a day). PAIN MEDICINES: CARE ADVICE given per Knee Injury (Adult) guideline. CALL BACK IF: * Pain becomes severe * You become worse Referrals REFERRED TO PCP  OFFICE Triage Details User: Otelia Santee Date/Time: 02/27/2021 9:25:37 AM Triage Guideline: Knee Injury Serious injury with multiple fractures (broken bones) -----No [1] Major bleeding (e.g., actively dripping or spurting) AND [2] can't be stopped -----No Looks like a dislocated joint or knee cap (crooked or deformed) -----No Bullet wound, stabbed by knife, or other serious penetrating wound -----No Sounds like a life-threatening emergency to the triager -----No Wound looks infected -----No Can't stand (bear weight) or walk -----No PLEASE NOTE: All timestamps contained within this report are represented as [place] Standard Time. CONFIDENTIALTY NOTICE: This fax transmission is intended only for the addressee. It contains information that is legally privileged, confidential or otherwise protected from use or disclosure. If you are not the intended recipient, you are strictly prohibited from reviewing, disclosing, copying using or disseminating any of this information or taking any action in reliance on or regarding this information. If you have received this fax in error, please notify us immediately by telephone so that we can arrange for its return to [place]. Phone: [phone], Toll-Free: [phone], Fax: [phone] Page: 3 of 3 Call Id: [id number] Triage Details Skin is split open or gaping (or length > 1/2 inch or 12 mm) -----No [1] Bleeding AND [2] won't stop after 10 minutes of direct pressure (using correct technique) -----No [1] Dirt in the wound AND [2] not removed with 15 minutes of scrubbing -----No Sounds like a serious injury to the triager -----No [1] SEVERE pain AND [2] not improved 2 hours after pain medicine/ice packs -----No Suspicious history for the injury -----No [1] High-risk adult (e.g., age > 92 years, osteoporosis, chronic steroid use) AND [2] limping -----Yes Disposition: See PCP within 24 Hours SEE PCP WITHIN 24 HOURS: * IF OFFICE WILL BE OPEN:  You need to be examined within the next 24 hours. Call your doctor (or NP/PA) when the office opens and make an appointment. CRUTCHES: * Try not to put any weight on the injured leg until evaluated. * Obtain a pair of crutches from the local pharmacy. USE HEAT ON AREA AFTER 48 HOURS: * If pain, swelling, or bruising last more than 48 hours (2 days), then use heat on the area. * This will help increase blood flow and improve healing. * Do this for 10 minutes three times a day. * Caution: burn. Do not sleep on a heating pad. * ACETAMINOPHEN - EXTRA STRENGTH TYLENOL: Take 1,000 mg (two 500 mg pills) every 8 hours as needed. Each Extra Strength Tylenol pill has 500 mg of acetaminophen. The most you should take each day is 3,000 mg (6 pills a day). PAIN MEDICINES: CARE ADVICE given per Knee Injury (Adult) guideline. CALL BACK IF: * Pain becomes severe * You become worse

## 2021-02-27 NOTE — Telephone Encounter (Signed)
Per appt notes; pt has appt in office 02/27/21 with Dr Darnell Level.

## 2021-02-27 NOTE — Telephone Encounter (Signed)
Pt seen today

## 2021-02-27 NOTE — Progress Notes (Signed)
Patient ID: DWAN FENNEL, female    DOB: Mar 29, 1941, 80 y.o.   MRN: [id number]  This visit was conducted in person.  BP 140/82   Pulse 73   Temp 98.3 F (36.8 C) (Temporal)   Ht 5' 2.5" (1.588 m)   Wt 133 lb 8 oz (60.6 kg)   SpO2 95%   BMI 24.03 kg/m    CC: bilateral knee pain after fall  Subjective:   HPI: AMEILIA RATTAN is a 80 y.o. female presenting on 02/27/2021 for Fall (Pt fell 02/08/21 at home.  C/o bilateral knee pain. )   DOI: 2-3 wks ago  [place] at home while she was getting up out of her recliner - tripped on bathrobe. Landed on both knees onto vinyl tile on concrete. She did have gash to R anterior knee which is healing, does note more pain when transitioning out of chair. Predominant concern is left knee bruising which has since developed.   Using tylenol 2-4 pills per day for discomfort.   She is on aspirin blood thinner. She fractured L kneecap after fall 2-3 yrs ago - treated with immobilizer.      Relevant past medical, surgical, family and social history reviewed and updated as indicated. Interim medical history since our last visit reviewed. Allergies and medications reviewed and updated. Outpatient Medications Prior to Visit  Medication Sig Dispense Refill  . acetaminophen (TYLENOL) 500 MG tablet Take 2 tablets (1,000 mg total) by mouth 2 (two) times daily as needed. (Patient taking differently: Take 1,000 mg by mouth 2 (two) times daily as needed for mild pain or moderate pain.)    . albuterol (VENTOLIN HFA) 108 (90 Base) MCG/ACT inhaler Inhale 2 puffs into the lungs every 6 (six) hours as needed for wheezing or shortness of breath. 18 g 1  . amLODipine (NORVASC) 2.5 MG tablet TAKE 1 TABLET BY MOUTH ONCE A DAY 30 tablet 6  . aspirin 81 MG tablet Take 1 tablet (81 mg total) by mouth daily. 30 tablet   . Carbidopa-Levodopa ER (SINEMET CR) 25-100 MG tablet controlled release TAKE 2 TABLETS BY MOUTH IN THE MORNING, 1 TAB IN THE AFTERNOON AND 1 TAB IN THE  EVENING 360 tablet 1  . clonazePAM (KLONOPIN) 0.5 MG tablet TAKE 1/2 A TABLET BY MOUTH AT BEDTIME 45 tablet 1  . EPINEPHrine 0.3 mg/0.3 mL IJ SOAJ injection     . gabapentin ([name]) 300 MG capsule TAKE 2 CAPSULES BY MOUTH 3 TIMES DAILY 540 capsule 1  . omeprazole (PRILOSEC) 20 MG capsule Take 1 capsule (20 mg total) by mouth daily. Please keep your April appointment with Dr. [name] for further refills. Thank you 30 capsule 1  . TRELEGY ELLIPTA 100-62.5-25 MCG/INH AEPB INHALE 1 PUFF BY MOUTH ONCE A DAY 60 each 5   No facility-administered medications prior to visit.     Per HPI unless specifically indicated in ROS section below Review of Systems Objective:  BP 140/82   Pulse 73   Temp 98.3 F (36.8 C) (Temporal)   Ht 5' 2.5" (1.588 m)   Wt 133 lb 8 oz (60.6 kg)   SpO2 95%   BMI 24.03 kg/m   Wt Readings from Last 3 Encounters:  02/27/21 133 lb 8 oz (60.6 kg)  01/30/21 137 lb (62.1 kg)  10/28/20 133 lb (60.3 kg)      Physical Exam Vitals and nursing note reviewed.  Constitutional:      Appearance: Normal appearance. She is not ill-appearing.  Musculoskeletal:        General: Swelling, tenderness and signs of injury present.     Right lower leg: No edema.     Left lower leg: No edema.     Comments:  Bilateral knee exam:  No deformity on inspection.  Pain with palpation of R lateral joint line as well as L pes anserine busa  No effusion/swelling noted. FROM in flex/extension without significant crepitus. No popliteal fullness. Neg seated mcmurray test. No pain with valgus/varus stress. No PFgrind.  Skin:    General: Skin is warm and dry.     Capillary Refill: Capillary refill takes less than 2 seconds.     Findings: Bruising, ecchymosis and signs of injury present.          Comments:  Few bruises to right lower leg  Marked bruising/ecchymosis to left lower leg distal to knee with soft tissue swelling overlying pes anserine bursa   Neurological:     Mental Status:  She is alert.       Assessment & Plan:  This visit occurred during the SARS-CoV-2 public health emergency.  Safety protocols were in place, including screening questions prior to the visit, additional usage of staff PPE, and extensive cleaning of exam room while observing appropriate contact time as indicated for disinfecting solutions.   Problem List Items Addressed This Visit    Knee injury, initial encounter - Primary    Fall 2-3 wks ago with ongoing symptoms.  Evidence of hematoma formation overlying L pes anserine bursa.  Check xrays r/o bone injury.  Supportive care reviewed.  Update if not improving with conservative management of leg elevation, warm compresses to hematoma and knee sleeve brace use. Continue tylenol PRN pain.       Relevant Orders   DG Knee Complete 4 Views Right   DG Knee Complete 4 Views Left       No orders of the defined types were placed in this encounter.  Orders Placed This Encounter  Procedures  . DG Knee Complete 4 Views Right    Standing Status:   Future    Number of Occurrences:   1    Standing Expiration Date:   02/27/2022    Order Specific Question:   Reason for Exam (SYMPTOM  OR DIAGNOSIS REQUIRED)    Answer:   fall 2 wks ago with residual injury    Order Specific Question:   Preferred imaging location?    Answer:   [facility]  . DG Knee Complete 4 Views Left    Standing Status:   Future    Number of Occurrences:   1    Standing Expiration Date:   02/27/2022    Order Specific Question:   Reason for Exam (SYMPTOM  OR DIAGNOSIS REQUIRED)    Answer:   fall 2+ wks ago with residual injury and soft tissue swelling distal to knee    Order Specific Question:   Preferred imaging location?    Answer:   [facility]    Patient Instructions  xrays today  Use warm compresses to left leg hematoma  May continue tylenol as needed.  Keep legs elevated, take transitions slowly, let us know if not improving over time.   Follow up  plan: Return if symptoms worsen or fail to improve.  [name], MD

## 2021-02-27 NOTE — Patient Instructions (Signed)
xrays today  Use warm compresses to left leg hematoma  May continue tylenol as needed.  Keep legs elevated, take transitions slowly, let us know if not improving over time.

## 2021-02-27 NOTE — Assessment & Plan Note (Signed)
Fall 2-3 wks ago with ongoing symptoms.  Evidence of hematoma formation overlying L pes anserine bursa.  Check xrays r/o bone injury.  Supportive care reviewed.  Update if not improving with conservative management of leg elevation, warm compresses to hematoma and knee sleeve brace use. Continue tylenol PRN pain.

## 2021-02-28 ENCOUNTER — Encounter: Payer: Self-pay | Admitting: Internal Medicine

## 2021-02-28 ENCOUNTER — Ambulatory Visit (INDEPENDENT_AMBULATORY_CARE_PROVIDER_SITE_OTHER): Payer: Medicare PPO

## 2021-02-28 ENCOUNTER — Ambulatory Visit: Payer: Medicare PPO | Admitting: Internal Medicine

## 2021-02-28 VITALS — BP 110/60 | HR 56 | Ht 62.5 in | Wt 134.6 lb

## 2021-02-28 DIAGNOSIS — E538 Deficiency of other specified B group vitamins: Secondary | ICD-10-CM | POA: Diagnosis not present

## 2021-02-28 DIAGNOSIS — K222 Esophageal obstruction: Secondary | ICD-10-CM | POA: Diagnosis not present

## 2021-02-28 DIAGNOSIS — J301 Allergic rhinitis due to pollen: Secondary | ICD-10-CM | POA: Diagnosis not present

## 2021-02-28 DIAGNOSIS — Z86010 Personal history of colonic polyps: Secondary | ICD-10-CM | POA: Diagnosis not present

## 2021-02-28 DIAGNOSIS — K2100 Gastro-esophageal reflux disease with esophagitis, without bleeding: Secondary | ICD-10-CM | POA: Diagnosis not present

## 2021-02-28 DIAGNOSIS — J3089 Other allergic rhinitis: Secondary | ICD-10-CM | POA: Diagnosis not present

## 2021-02-28 MED ORDER — CYANOCOBALAMIN 1000 MCG/ML IJ SOLN
1000.0000 ug | Freq: Once | INTRAMUSCULAR | Status: AC
  Administered 2021-02-28: 1000 ug via INTRAMUSCULAR

## 2021-02-28 MED ORDER — OMEPRAZOLE 20 MG PO CPDR: 20.0000 mg | DELAYED_RELEASE_CAPSULE | Freq: Every day | ORAL | 11 refills | Status: DC

## 2021-02-28 NOTE — Progress Notes (Signed)
HISTORY OF PRESENT ILLNESS:  Crystal Bass is a 80 y.o. female with multiple medical problems as listed below who was last seen in this office September 15, 2018 regarding transient right upper quadrant pain, abnormal CT scan, intermittent solid food dysphagia, and colon cancer screening.  See that dictation.  She subsequently underwent colonoscopy and upper endoscopy October 09, 2018.  Colonoscopy revealed colon polyps which were adenomatous and hemorrhoids.  No follow-up based on age.  Upper endoscopy revealed reflux esophagitis, peptic stricture, hiatal hernia.  There was also gastritis.  The esophagus was dilated with 54 [place] Mal[place] dilator.  She was prescribed omeprazole 20 mg daily.  She follows up at this time regarding management of her chronic GERD.  She does request refill of her omeprazole.  She has questions regarding medication safety.  She reports that as long as she takes her omeprazole she is asymptomatic.  Off medication symptoms recur within a day or 2.  She has had no further issues with dysphagia.  Reviewed blood work from February 2022 shows unremarkable basic metabolic panel.  She has completed her COVID vaccination series and booster  REVIEW OF SYSTEMS:  All non-GI ROS negative unless otherwise stated in the HPI except for sinus and allergy, shortness of breath  Past Medical History:  Diagnosis Date  . 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 Aortic root aneurysm ([place])   . Arthritis   . Asthma   . Breast cancer ([place]) 2002   infiltrative ductal carcinoma   . Cataract 2019  . COPD (chronic obstructive pulmonary disease) ([place]) 9/08  . Ductal carcinoma of breast, estrogen receptor positive, stage 1 ([place]) 09/16/2012  . Endometriosis   . GERD (gastroesophageal reflux disease)   . Hypercalcemia 09/14/2013  . Hypertension   . Lesion of breast 1992   right, benign  . Lung disease    secondary to [name] infection  . Mastalgia 7/95  .  Osteoporosis, post-menopausal 03/14/2012  . Ovarian tumor of borderline malignancy, right 2004  . Pancreatitis    secondary to Cholelithiasis  . Parkinson disease ([place]) 02/10/15  . Post-thoracotomy pain syndrome   . Pseudomonas pneumonia ([place]) 5/09  . Uterine fibroid   . Vitamin D deficiency     Past Surgical History:  Procedure Laterality Date  . 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 APPENDECTOMY  2004  . BREAST BIOPSY  08/2004   right breast-benign  . BREAST LUMPECTOMY  1992   benign  . CARDIAC CATHETERIZATION  2007   essentially negation for significant CAD  . CHOLECYSTECTOMY  2001  . COLONOSCOPY  2014   Dr [name] , due 2019  . COLONOSCOPY  09/2018   2 TA removed, int hem, no f/u needed (Dr [name])   . ESOPHAGOGASTRODUODENOSCOPY  09/2018   GERD with esophagitis and stricture dilated, antral gastritis negative for H pylori [name])  . LUNG BIOPSY  2002   [name], Dr [name]  . MASTECTOMY MODIFIED RADICAL Left 2002   oral chemotheraphy (tamoxifen then Armidex) no radiation, Dr.[name]  . TEE WITHOUT CARDIOVERSION N/A 12/29/2018   Procedure: TRANSESOPHAGEAL ECHOCARDIOGRAM (TEE);  Surgeon: Wellington Hampshire, MD;  Location: [facility] ORS;  Service: Cardiovascular;  Laterality: N/A;  . [place]  reports that she has never smoked. She has never used smokeless tobacco. She reports that she does not  drink alcohol and does not use drugs.  family history includes Breast cancer in her cousin; Breast cancer (age of onset: 14) in her sister; Cancer in her maternal grandfather, maternal grandmother, maternal uncle, maternal uncle, and maternal uncle; Colon cancer in her sister; Colon cancer (age of onset: 61) in her cousin; Emphysema in her mother; Liver cancer in her maternal aunt; Lung cancer (age of onset: 35) in her cousin; Lung cancer (age of onset: 73) in her father; Melanoma in her maternal aunt;  Myasthenia gravis in her paternal aunt; Osteoporosis in her paternal aunt; Stroke in her paternal aunt.  Allergies  Allergen Reactions  . Sulfonamide Derivatives Anaphylaxis  . Topamax [Topiramate] Other (See Comments)    Metabolic acidosis   . Biaxin [Clarithromycin] Other (See Comments)    pericarditis  . Hydrocodone Other (See Comments)    "hyper and climbing the walls"  . Motrin [Ibuprofen] Other (See Comments)    headaches  . Sulfa Antibiotics     Other reaction(s): Unknown  . Symbicort [Budesonide-Formoterol Fumarate] Other (See Comments)    02/07/15 tremor  . Tetracycline Hcl Other (See Comments)  . Percocet [Oxycodone-Acetaminophen] Itching and Rash  . Tape Itching and Rash    Use paper tape only  . Tetracyclines & Related Other (See Comments)    "immediate yeast infection"       PHYSICAL EXAMINATION: Vital signs: BP 110/60   Pulse (!) 56   Ht 5' 2.5" (1.588 m)   Wt 134 lb 9.6 oz (61.1 kg)   BMI 24.23 kg/m   Constitutional: generally well-appearing, no acute distress Psychiatric: alert and oriented x3, cooperative Eyes: extraocular movements intact, anicteric, conjunctiva pink Mouth: oral pharynx moist, no lesions Neck: supple no lymphadenopathy Cardiovascular: heart regular rate and rhythm, no murmur Lungs: clear to auscultation bilaterally Abdomen: soft, nontender, nondistended, no obvious ascites, no peritoneal signs, normal bowel sounds, no organomegaly Rectal: Omitted Extremities: no clubbing, cyanosis,.  Trace lower extremity edema bilaterally Skin: no lesions on visible extremities Neuro: No focal deficits.  Cranial nerves intact  ASSESSMENT:  1.  GERD complicated by esophagitis and peptic stricture.  Significant reflux symptoms on medication.  No recurrent dysphagia post dilation 2.  History of colon cancer history of diminutive adenomas.  Last colonoscopy 2019.  Aged out of surveillance   PLAN:  1.  Reflux precautions 2.  Refill omeprazole 20 mg  daily.  Medication risks reviewed 3.  Routine GI follow-up 2 years.  Contact the office in the interim for any questions or problems

## 2021-02-28 NOTE — Patient Instructions (Signed)
If you are age 78 or older, your body mass index should be between 23-30. Your Body mass index is 24.23 kg/m. If this is out of the aforementioned range listed, please consider follow up with your Primary Care Provider.  If you are age 59 or younger, your body mass index should be between 19-25. Your Body mass index is 24.23 kg/m. If this is out of the aformentioned range listed, please consider follow up with your Primary Care Provider.   We have sent refills of your Omeprazole to your pharmacy.  Dr. [name] would like for you to follow up on your GERD in 2 years.

## 2021-02-28 NOTE — Progress Notes (Signed)
Per orders of Dr. [name], injection of B-12 given by Francella Solian in right deltoid. Patient tolerated injection well.

## 2021-03-07 DIAGNOSIS — J3089 Other allergic rhinitis: Secondary | ICD-10-CM | POA: Diagnosis not present

## 2021-03-07 DIAGNOSIS — J301 Allergic rhinitis due to pollen: Secondary | ICD-10-CM | POA: Diagnosis not present

## 2021-03-14 DIAGNOSIS — J3089 Other allergic rhinitis: Secondary | ICD-10-CM | POA: Diagnosis not present

## 2021-03-14 DIAGNOSIS — J301 Allergic rhinitis due to pollen: Secondary | ICD-10-CM | POA: Diagnosis not present

## 2021-03-21 DIAGNOSIS — J3089 Other allergic rhinitis: Secondary | ICD-10-CM | POA: Diagnosis not present

## 2021-03-21 DIAGNOSIS — J301 Allergic rhinitis due to pollen: Secondary | ICD-10-CM | POA: Diagnosis not present

## 2021-03-22 NOTE — Progress Notes (Signed)
Assessment/Plan:   1.  Parkinsons Disease  -Continue carbidopa/levodopa 25/100 CR, 2/1/1.  Changing from IR did not change hypersomnolence, but I did not change back because of orthostasis.  -gave resources/books recommended for Parkinsons Disease  2.EDS -don't think that related to PD             -this is the biggest c/o.    Gabapentin likely is playing a role (although BP certainly an issue), but PCP attempting to lower 3. Orthostatic hypotension, with history of associated syncope -Refuses compression binder. -Compliance is an issue here.  Has been prescribed Northera by cardiology but has not taken.    -Patient is on blood pressure medications, but does not want to change that given her worry about her thoracic aortic aneurysm.  However, changing BP meds has helped her not feel so pre-syncopal 4. REM behavior disorder  -Continue clonazepam 0.5 mg, half tablet at bedtime. 5. Mild cognitive impairment -Last neurocognitive testing was in September, 2018.  I have not seen significant change since that time.  She is noting some worsening of memory and discussed repeating but she wants to hold for now  Subjective:   Crystal Bass was seen today in follow up for Parkinsons disease.  My previous records were reviewed prior to todays visit as well as outside records available to me.  She saw her primary care physician on April 4 after a fall 2 to 3 weeks prior.  She states that she got her foot caught in her robe and she fell and she hurt her leg.  She saw gastroenterology on April 5 for reflux and was told that she did not need to follow-up for another 2 years.  No syncope and no lightheadedness "like I used to."  She occ walks for exercise but not often.  Current prescribed movement disorder medications: Carbidopa/levodopa 25/100 CR, 2 tablets in the morning, 1 in the afternoon and 1 in the evening Clonazepam 0.5 mg, half  tablet at night   PREVIOUS MEDICATIONS: Sinemet (changed from IR due to EDS but not sure it really made a difference but we did not change back because of [place]); northera (RX by cardiology but not taken)  ALLERGIES:   Allergies  Allergen Reactions  . Sulfonamide Derivatives Anaphylaxis  . Topamax [Topiramate] Other (See Comments)    Metabolic acidosis   . Biaxin [Clarithromycin] Other (See Comments)    pericarditis  . Hydrocodone Other (See Comments)    "hyper and climbing the walls"  . Motrin [Ibuprofen] Other (See Comments)    headaches  . Sulfa Antibiotics     Other reaction(s): Unknown  . Symbicort [Budesonide-Formoterol Fumarate] Other (See Comments)    02/07/15 tremor  . Tetracycline Hcl Other (See Comments)  . Percocet [Oxycodone-Acetaminophen] Itching and Rash  . Tape Itching and Rash    Use paper tape only  . Tetracyclines & Related Other (See Comments)    "immediate yeast infection"    CURRENT MEDICATIONS:  Outpatient Encounter Medications as of 03/24/2021  Medication Sig  . acetaminophen (TYLENOL) 500 MG tablet Take 2 tablets (1,000 mg total) by mouth 2 (two) times daily as needed. (Patient taking differently: Take 1,000 mg by mouth 2 (two) times daily as needed for mild pain or moderate pain.)  . albuterol (VENTOLIN HFA) 108 (90 Base) MCG/ACT inhaler Inhale 2 puffs into the lungs every 6 (six) hours as needed for wheezing or shortness of breath.  [name] amLODipine (NORVASC) 2.5 MG tablet TAKE 1 TABLET BY  MOUTH ONCE A DAY  . aspirin 81 MG tablet Take 1 tablet (81 mg total) by mouth daily.  . Carbidopa-Levodopa ER (SINEMET CR) 25-100 MG tablet controlled release TAKE 2 TABLETS BY MOUTH IN THE MORNING, 1 TAB IN THE AFTERNOON AND 1 TAB IN THE EVENING  . clonazePAM (KLONOPIN) 0.5 MG tablet TAKE 1/2 A TABLET BY MOUTH AT BEDTIME  . EPINEPHrine 0.3 mg/0.3 mL IJ SOAJ injection   . gabapentin ([name]) 300 MG capsule TAKE 2 CAPSULES BY MOUTH 3 TIMES DAILY  . omeprazole (PRILOSEC)  20 MG capsule Take 1 capsule (20 mg total) by mouth daily.  . TRELEGY ELLIPTA 100-62.5-25 MCG/INH AEPB INHALE 1 PUFF BY MOUTH ONCE A DAY   No facility-administered encounter medications on file as of 03/24/2021.    Objective:   PHYSICAL EXAMINATION:    VITALS:   Vitals:   03/24/21 1419  BP: 124/76  Pulse: 76  SpO2: 97%  Weight: 134 lb (60.8 kg)  Height: 5\' 2"  (1.575 m)    GEN:  The patient appears stated age and is in NAD. HEENT:  Normocephalic, atraumatic.  The mucous membranes are moist. The superficial temporal arteries are without ropiness or tenderness. CV:  RRR Lungs:  CTAB.  There is some DOE Neck/HEME:  There are no carotid bruits bilaterally.  Neurological examination:  Orientation: The patient is alert and oriented x3. Cranial nerves: There is good facial symmetry without facial hypomimia. The speech is fluent and clear. Soft palate rises symmetrically and there is no tongue deviation. Hearing is intact to conversational tone. Sensation: Sensation is intact to light touch throughout Motor: Strength is at least antigravity x4.  Movement examination: Tone: There is mild increased tone in the RUE Abnormal movements: none Coordination:  There is mild decremation with finger taps on the right and heel/toe taps bilaterally Gait and Station: The patient has no difficulty arising out of a deep-seated chair without the use of the hands. The patient's stride length is good   I have reviewed and interpreted the following labs independently    Chemistry      Component Value Date/Time   NA 141 01/05/2021 1600   NA 141 12/23/2018 1514   NA 141 03/28/2017 1130   K 3.5 01/05/2021 1600   K 4.2 03/28/2017 1130   CL 104 01/05/2021 1600   CL 102 03/10/2013 1529   CO2 28 01/05/2021 1600   CO2 28 03/28/2017 1130   BUN 20 01/05/2021 1600   BUN 16 12/23/2018 1514   BUN 17.3 03/28/2017 1130   CREATININE 0.94 01/05/2021 1600   CREATININE 0.8 03/28/2017 1130      Component  Value Date/Time   CALCIUM 9.1 01/05/2021 1600   CALCIUM 9.6 03/28/2017 1130   ALKPHOS 81 01/22/2020 1259   ALKPHOS 85 03/28/2017 1130   AST 12 01/22/2020 1259   AST 12 03/28/2017 1130   ALT 1 01/22/2020 1259   ALT <6 03/28/2017 1130   BILITOT 1.4 (H) 01/22/2020 1259   BILITOT 1.38 (H) 03/28/2017 1130       Lab Results  Component Value Date   WBC 5.9 12/23/2018   HGB 13.7 12/23/2018   HCT 40.5 12/23/2018   MCV 90 12/23/2018   PLT 167 12/23/2018    Lab Results  Component Value Date   TSH 1.893 10/26/2018     Total time spent on today's visit was 25 minutes, including both face-to-face time and nonface-to-face time.  Time included that spent on review of records (prior notes  available to me/labs/imaging if pertinent), discussing treatment and goals, answering patient's questions and coordinating care.  Cc:  [name], MD

## 2021-03-23 ENCOUNTER — Telehealth: Payer: Self-pay | Admitting: Oncology

## 2021-03-23 NOTE — Telephone Encounter (Signed)
Left a message regarding CA 125 results.  Requested a return call. 

## 2021-03-24 ENCOUNTER — Other Ambulatory Visit: Payer: Self-pay

## 2021-03-24 ENCOUNTER — Encounter: Payer: Self-pay | Admitting: Neurology

## 2021-03-24 ENCOUNTER — Ambulatory Visit: Payer: Medicare PPO | Admitting: Neurology

## 2021-03-24 VITALS — BP 124/76 | HR 76 | Ht 62.0 in | Wt 134.0 lb

## 2021-03-24 DIAGNOSIS — G20 Parkinson's disease: Secondary | ICD-10-CM | POA: Diagnosis not present

## 2021-03-24 MED ORDER — CARBIDOPA-LEVODOPA ER 25-100 MG PO TBCR: EXTENDED_RELEASE_TABLET | ORAL | 1 refills | Status: DC

## 2021-03-24 NOTE — Telephone Encounter (Signed)
Crystal Bass called back. Reviewed CA 125 results from 01/30/21 and reviewed Dr. Charisse March recommendations.  Crystal Bass and said she does not want imaging at this time.  Advised her to call back if she has any questions.

## 2021-03-24 NOTE — Patient Instructions (Addendum)
Online Resources for Power over Parkinson's Group April 2022  . Local [place] Online Groups  o Power over Parkinson's Group :   - Power Over Parkinson's Patient Education Group will be Wednesday, April 13th at 2pm via Zoom.   - Upcoming Power over Parkinson's Meetings:  2nd Wednesdays of the month at 2 pm:  May 11th, June 8th - Contact Amy Marriott at [email or web address] if interested in participating in this online group o Parkinson's Care Partners Group:    3rd Mondays, Contact Misty Palladino o Atypical Parkinsonian Patient Group:   4th Wednesdays, Contact Misty Palladino o If you are interested in participating in these online groups with Misty, please contact her directly for how to join those meetings.  Her contact information is [email or web address]  She will send you a link to join the Zoom meeting.    . Parkinson Foundation:  www.parkinson.org o PD Health at Home continues:  Mindfulness Mondays, Expert Briefing Tuesdays, Wellness Wednesdays, Take Time Thursdays, Fitness Fridays -Listings for March 2022 are on the website o Upcoming Webinar:  Can We Put the Brakes on PD Progression?  Wednesday, April 6th @ 1 pm o Register for expert briefings (webinars) at [email or web address] o  Please check out their website to sign up for emails and see their full online offerings  . Michael J Fox Foundation:  www.michaeljfox.org  o Upcoming Webinar:   New to Parkinson's?  Steps to Take Today.  Thursday, March 21st @ 12 noon o Check out additional information on their website to see their full online offerings  . Davis Phinney Foundation:  www.davisphinneyfoundation.org o Upcoming Webinar:  Stay tuned o Care Partner Monthly Meetup.  With Connie Carpenter Phinney.  First Tuesday of each month, 2 pm o Check out additional information to Live Well Today on their website  . Parkinson and Movement Disorders (PMD) Alliance:  www.pmdalliance.org o [employer] Online:   Online Education Events o Sign up for emails, which are sent weekly to give you updates on programming and online offerings     . Parkinson's Association of [place]:  www.parkinsonassociation.org o Information on online support groups, education events, and online exercises including Yoga, Parkinson's exercises and more-LOTS of information on links to PD resources and online events o Virtual Support Group through Parkinson's Association of [place]; next one is scheduled for Wednesday, March 01, 2021 at 2 pm. (These are typically scheduled for the 1st Wednesday of the month at 2 pm).  Visit website for details.  . Additional links for movement activities: o PWR! Moves Classes at [facility] HAVE RESUMED!  Wednesdays 10 and 11 am.  Contact Amy Marriott, PT [email or web address] or [phone] if interested o Here is a link to the PWR!Moves classes on Zoom from [place] Parkinson's Foundation - Daily Mon-Sat at 10:00. Via Zoom, FREE and open to all.  There is also a link below via Facebook if you use that platform. - https://www.parkinsonsmi.org/mpf-programs/exercise-and-movement-activities - https://www.facebook.com/ParkinsonsMI.org/posts/pwr-moves-exercise-class-parkinson-wellness-recovery-online-with-angee-ludwa-pt-/10156827878021813/ o Parkinson's Wellness Recovery (PWR! Moves)  www.pwr4life.org - Info on the PWR! Virtual Experience:  You will have access to our expertise through self-assessment, guided plans that start with the PD-specific fundamentals, educational content, tips, Q&A with an expert, and a growing library of PD-specific pre-recorded and live exercise classes of varying types and intensity - both physical and cognitive! If that is not enough, we offer 1:1 wellness consultations (in-person or virtual) to personalize your PWR! Virtual Experience.  - Check out the PWR! Move of the month on the Parkinson Wellness Recovery website:     https://www.hernandez-brewer.[email or web address]/ o [employer] Fridays:  - As part of the PD Health @ Home program, this free video series focuses each week on one aspect of fitness designed to support people living with Parkinson's.  These weekly videos highlight the [place] recent fitness guidelines for people with Parkinson's disease. -  HollywoodSale.dk o Dance for PD website is offering free, live-stream classes throughout the week, as well as links to [employer] of classes:  https://danceforparkinsons.org/ o Dance for Parkinson's Class:  [place].  Free offering for people with Parkinson's and care partners; virtual class.  o For more information, contact [phone] or email Ruffin Frederick at [email or web address] o Virtual dance and Pilates for Parkinson's classes: Click on the Community Tab> Parkinson's Movement Initiative Tab.  To register for classes and for more information, visit www.[facility] and click the "community" tab.     o [employer] Parkinson's Cycling Classes  - [facility] [employer]: 1pm on Fridays-Live classes at [facility] at [email or web address] or [phone]) [phone] [employer]: Virtual Classes Mondays and Thursdays (contact [place] at [email or web address] or [phone])  o [place] levels of classes are offered Tuesdays and Thursdays:  10:30 am,  12 noon & 1:45 pm at [facility].  - Active Stretching with [facility] Class starting in March, on Fridays - To observe a class or for  more information, call [phone] or email [email or web address] . [employer]: o [phone] Opportunities:  www.well-springsolutions.org/caregiver-education/caregiver-support-group.  You may also contact Chief Technology Officer at  [email or web address] or [phone].   o [facility] Navigator:  Just1Navigator program, a free service to help individuals and families through the journey of determining care for older adults.  The "Navigator" is a 01-26-1987, [phone], who will speak with a prospective client and/or loved ones to provide an assessment of the situation and a set of recommendations for a personalized care plan -- all free of charge, and whether [facility] [employer] offers the needed service or not. If the need is not a service we provide, we are well-connected with reputable programs in town that we can refer you to.  www.well-springsolutions.org or to speak with the Navigator, call [phone]. Here are some resources/books that you may find helpful as you navigate the challenges of Parkinson's Disease  1.  Parkinson's treatement: 10 secrets to a happier life by Arnell Asal, MD 2.  Navigating Life with Parkinsons disease by [place] 3.  My degeneration: A journey through Parkinsons Andy Gauss - [place]) 4.  Every Victory counts (I believe this one if free through [address]) 5.  Lucky Man by Orlando Penner 6.  101 Questions & Answers about Parkinson's by [employer] 7.  Parkinsons Disease Treatment Book by JE Ahiskog

## 2021-03-27 ENCOUNTER — Ambulatory Visit (HOSPITAL_BASED_OUTPATIENT_CLINIC_OR_DEPARTMENT_OTHER): Payer: Medicare PPO | Admitting: Obstetrics & Gynecology

## 2021-03-31 DIAGNOSIS — J301 Allergic rhinitis due to pollen: Secondary | ICD-10-CM | POA: Diagnosis not present

## 2021-03-31 DIAGNOSIS — J3089 Other allergic rhinitis: Secondary | ICD-10-CM | POA: Diagnosis not present

## 2021-04-11 ENCOUNTER — Ambulatory Visit (INDEPENDENT_AMBULATORY_CARE_PROVIDER_SITE_OTHER): Payer: Medicare PPO

## 2021-04-11 ENCOUNTER — Other Ambulatory Visit: Payer: Self-pay

## 2021-04-11 DIAGNOSIS — J3089 Other allergic rhinitis: Secondary | ICD-10-CM | POA: Diagnosis not present

## 2021-04-11 DIAGNOSIS — E538 Deficiency of other specified B group vitamins: Secondary | ICD-10-CM | POA: Diagnosis not present

## 2021-04-11 DIAGNOSIS — J301 Allergic rhinitis due to pollen: Secondary | ICD-10-CM | POA: Diagnosis not present

## 2021-04-11 MED ORDER — CYANOCOBALAMIN 1000 MCG/ML IJ SOLN
1000.0000 ug | Freq: Once | INTRAMUSCULAR | Status: AC
  Administered 2021-04-11: 1000 ug via INTRAMUSCULAR

## 2021-04-11 NOTE — Progress Notes (Signed)
Per orders of Dr. [name], injection of monthly 1037mcg/ml B12 injection given by Pilar Grammes, CMA in right deltoid (per pt request). Patient tolerated injection well.  Advised pt that she needs to make sure she is scheduled for a medicare wellness with Dr [name] soon as she is past due for labs. She will call the office to schedule.

## 2021-04-17 ENCOUNTER — Encounter: Payer: Self-pay | Admitting: Oncology

## 2021-04-25 DIAGNOSIS — J3089 Other allergic rhinitis: Secondary | ICD-10-CM | POA: Diagnosis not present

## 2021-04-25 DIAGNOSIS — J301 Allergic rhinitis due to pollen: Secondary | ICD-10-CM | POA: Diagnosis not present

## 2021-05-02 DIAGNOSIS — J301 Allergic rhinitis due to pollen: Secondary | ICD-10-CM | POA: Diagnosis not present

## 2021-05-02 DIAGNOSIS — J3089 Other allergic rhinitis: Secondary | ICD-10-CM | POA: Diagnosis not present

## 2021-05-12 DIAGNOSIS — J301 Allergic rhinitis due to pollen: Secondary | ICD-10-CM | POA: Diagnosis not present

## 2021-05-12 DIAGNOSIS — J3089 Other allergic rhinitis: Secondary | ICD-10-CM | POA: Diagnosis not present

## 2021-05-16 ENCOUNTER — Other Ambulatory Visit: Payer: Self-pay

## 2021-05-16 ENCOUNTER — Ambulatory Visit (INDEPENDENT_AMBULATORY_CARE_PROVIDER_SITE_OTHER): Payer: Medicare PPO | Admitting: *Deleted

## 2021-05-16 DIAGNOSIS — J3089 Other allergic rhinitis: Secondary | ICD-10-CM | POA: Diagnosis not present

## 2021-05-16 DIAGNOSIS — E538 Deficiency of other specified B group vitamins: Secondary | ICD-10-CM

## 2021-05-16 DIAGNOSIS — J301 Allergic rhinitis due to pollen: Secondary | ICD-10-CM | POA: Diagnosis not present

## 2021-05-16 MED ORDER — CYANOCOBALAMIN 1000 MCG/ML IJ SOLN
1000.0000 ug | Freq: Once | INTRAMUSCULAR | Status: AC
  Administered 2021-05-16: 1000 ug via INTRAMUSCULAR

## 2021-05-16 NOTE — Progress Notes (Signed)
Per orders of Dr.Gutierrez, injection of Vitamin B12 given by Micholas Drumwright Simpson.  Patient tolerated injection well. 

## 2021-05-25 DIAGNOSIS — J301 Allergic rhinitis due to pollen: Secondary | ICD-10-CM | POA: Diagnosis not present

## 2021-05-25 DIAGNOSIS — J3089 Other allergic rhinitis: Secondary | ICD-10-CM | POA: Diagnosis not present

## 2021-05-30 DIAGNOSIS — J3089 Other allergic rhinitis: Secondary | ICD-10-CM | POA: Diagnosis not present

## 2021-05-30 DIAGNOSIS — J301 Allergic rhinitis due to pollen: Secondary | ICD-10-CM | POA: Diagnosis not present

## 2021-06-02 ENCOUNTER — Ambulatory Visit: Payer: Medicare PPO

## 2021-06-08 DIAGNOSIS — J3089 Other allergic rhinitis: Secondary | ICD-10-CM | POA: Diagnosis not present

## 2021-06-08 DIAGNOSIS — J301 Allergic rhinitis due to pollen: Secondary | ICD-10-CM | POA: Diagnosis not present

## 2021-06-10 ENCOUNTER — Other Ambulatory Visit: Payer: Self-pay | Admitting: Family Medicine

## 2021-06-12 NOTE — Telephone Encounter (Signed)
Last OV - 02/27/2021 Next OV - 06/23/2021 Last Filled - 12/07/2020

## 2021-06-13 ENCOUNTER — Ambulatory Visit (HOSPITAL_BASED_OUTPATIENT_CLINIC_OR_DEPARTMENT_OTHER): Payer: Medicare PPO | Admitting: Obstetrics & Gynecology

## 2021-06-13 DIAGNOSIS — J3089 Other allergic rhinitis: Secondary | ICD-10-CM | POA: Diagnosis not present

## 2021-06-13 DIAGNOSIS — J301 Allergic rhinitis due to pollen: Secondary | ICD-10-CM | POA: Diagnosis not present

## 2021-06-14 ENCOUNTER — Ambulatory Visit (INDEPENDENT_AMBULATORY_CARE_PROVIDER_SITE_OTHER): Payer: Medicare PPO | Admitting: Obstetrics & Gynecology

## 2021-06-14 ENCOUNTER — Ambulatory Visit (HOSPITAL_BASED_OUTPATIENT_CLINIC_OR_DEPARTMENT_OTHER)
Admission: RE | Admit: 2021-06-14 | Discharge: 2021-06-14 | Disposition: A | Discharge status: Home / Self Care | Payer: Medicare PPO | Source: Ambulatory Visit | Attending: Obstetrics & Gynecology | Admitting: Obstetrics & Gynecology

## 2021-06-14 ENCOUNTER — Other Ambulatory Visit: Payer: Self-pay

## 2021-06-14 ENCOUNTER — Encounter (HOSPITAL_BASED_OUTPATIENT_CLINIC_OR_DEPARTMENT_OTHER): Payer: Self-pay | Admitting: Obstetrics & Gynecology

## 2021-06-14 VITALS — BP 127/79 | HR 70 | Ht 62.0 in | Wt 129.8 lb

## 2021-06-14 DIAGNOSIS — Z90710 Acquired absence of both cervix and uterus: Secondary | ICD-10-CM | POA: Diagnosis not present

## 2021-06-14 DIAGNOSIS — Z780 Asymptomatic menopausal state: Secondary | ICD-10-CM | POA: Diagnosis not present

## 2021-06-14 DIAGNOSIS — D3911 Neoplasm of uncertain behavior of right ovary: Secondary | ICD-10-CM

## 2021-06-14 DIAGNOSIS — Z01419 Encounter for gynecological examination (general) (routine) without abnormal findings: Secondary | ICD-10-CM | POA: Diagnosis not present

## 2021-06-14 DIAGNOSIS — Z853 Personal history of malignant neoplasm of breast: Secondary | ICD-10-CM | POA: Insufficient documentation

## 2021-06-14 DIAGNOSIS — Z9079 Acquired absence of other genital organ(s): Secondary | ICD-10-CM

## 2021-06-14 DIAGNOSIS — Z1231 Encounter for screening mammogram for malignant neoplasm of breast: Secondary | ICD-10-CM

## 2021-06-14 DIAGNOSIS — M810 Age-related osteoporosis without current pathological fracture: Secondary | ICD-10-CM

## 2021-06-14 DIAGNOSIS — Z90722 Acquired absence of ovaries, bilateral: Secondary | ICD-10-CM

## 2021-06-14 NOTE — Progress Notes (Signed)
80 y.o. G0P0000 Widowed White or Caucasian female here for breast and pelvic exam.  I am also following her for h/o borderline ovarian tumor.  Did see Dr. Berline Lopes in 10/2020 and then again 01/2021.  Repeat ca-125 was 35.  Advised could stop or not repeat for at least one year.  Pt comfortable with this.  Denies vaginal bleeding.  Followed by Dr. Carles Collet for Parkinson's.  Last appt was 02/2021.  Seen every 6 months.  Has lost a few more pounds since I saw her in 02/2020.  States she just forgets to eat and really doesn't have an appetite.  Feels this is related to her parkinson's.  BMD showed osteoporosis.  Saw Dr. [name] on 04/19/2020.  Prolia recommended and started.  Note reviewed.  No LMP recorded. Patient has had a hysterectomy.          Sexually active: No.  H/O STD:  no  Health Maintenance: PCP:  Dr. [name].  Last wellness appt was 07/2020.  Did blood work at that appt:  12/2019 Vaccines are up to date:  no Colonoscopy:  10/09/2018, no follow up recommended MMG:  04/04/2020 negative BMD:  04/04/2020 Last pap smear:  prior to hysterectomy.   H/o abnormal pap smear:  no   reports that she has never smoked. She has never used smokeless tobacco. She reports that she does not drink alcohol and does not use drugs.  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   Aortic root aneurysm ([facility])    Arthritis    Asthma    Breast cancer ([place]) 2002   infiltrative ductal carcinoma    Cataract 2019   COPD (chronic obstructive pulmonary disease) ([place]) 9/08   Ductal carcinoma of breast, estrogen receptor positive, stage 1 ([place]) 09/16/2012   Endometriosis    GERD (gastroesophageal reflux disease)    Hypercalcemia 09/14/2013   Hypertension    Lesion of breast 1992   right, benign   Lung disease    secondary to [name] infection   Mastalgia 7/95   Osteoporosis, post-menopausal 03/14/2012   Ovarian tumor of borderline malignancy, right 2004    Pancreatitis    secondary to Cholelithiasis   Parkinson disease ([place]) 02/10/15   Post-thoracotomy pain syndrome    Pseudomonas pneumonia ([place]) 5/09   Uterine fibroid    Vitamin D deficiency     Past Surgical History:  Procedure Laterality Date   ABDOMINAL HYSTERECTOMY     & BSO for Mucinous borderline tumor of R ovary 2004   APPENDECTOMY  2004   BREAST BIOPSY  08/2004   right breast-benign   BREAST LUMPECTOMY  1992   benign   CARDIAC CATHETERIZATION  2007   essentially negation for significant CAD   CHOLECYSTECTOMY  2001   COLONOSCOPY  2014   Dr [name] , due 2019   COLONOSCOPY  09/2018   2 TA removed, int hem, no f/u needed (Dr [name])    ESOPHAGOGASTRODUODENOSCOPY  09/2018   GERD with esophagitis and stricture dilated, antral gastritis negative for H pylori [name])   LUNG BIOPSY  2002   [name], Dr [name]   MASTECTOMY MODIFIED RADICAL Left 2002   oral chemotheraphy (tamoxifen then Armidex) no radiation, Dr.[name]   TEE WITHOUT CARDIOVERSION N/A 12/29/2018   Procedure: TRANSESOPHAGEAL ECHOCARDIOGRAM (TEE);  Surgeon: Wellington Hampshire, MD;  Location: [facility] ORS;  Service: Cardiovascular;  Laterality: N/A;   TONSILLECTOMY  1946  Current Outpatient Medications  Medication Sig Dispense Refill   acetaminophen (TYLENOL) 500 MG tablet Take 2 tablets (1,000 mg total) by mouth 2 (two) times daily as needed. (Patient taking differently: Take 1,000 mg by mouth 2 (two) times daily as needed for mild pain or moderate pain.)     albuterol (VENTOLIN HFA) 108 (90 Base) MCG/ACT inhaler Inhale 2 puffs into the lungs every 6 (six) hours as needed for wheezing or shortness of breath. 18 g 1   amLODipine (NORVASC) 2.5 MG tablet TAKE 1 TABLET BY MOUTH ONCE A DAY 30 tablet 6   aspirin 81 MG tablet Take 1 tablet (81 mg total) by mouth daily. 30 tablet    Carbidopa-Levodopa ER (SINEMET CR) 25-100 MG tablet controlled release 2 at 9am, 1 at 1pm, 1 at 5pm 360 tablet 1   EPINEPHrine 0.3 mg/0.3 mL IJ  SOAJ injection      omeprazole (PRILOSEC) 20 MG capsule Take 1 capsule (20 mg total) by mouth daily. 30 capsule 11   TRELEGY ELLIPTA 100-62.5-25 MCG/INH AEPB INHALE 1 PUFF BY MOUTH ONCE A DAY 60 each 5   clonazePAM (KLONOPIN) 0.5 MG tablet TAKE 1/2 A TABLET BY MOUTH AT BEDTIME 45 tablet 1   gabapentin ([name]) 300 MG capsule TAKE 2 CAPSULES BY MOUTH 3 TIMES DAILY 540 capsule 1   No current facility-administered medications for this visit.    Family History  Problem Relation Age of Onset   Lung cancer Father 67       d.82 history of smoking   Breast cancer Sister 52   Colon cancer Sister    Emphysema Mother        d.64 was never a smoker   Stroke Paternal Aunt        in mid 77s   Osteoporosis Paternal Aunt    Melanoma Maternal Aunt    Cancer Maternal Uncle        unspecified type   Cancer Maternal Grandmother        d.82s unspecified GI cancer   Cancer Maternal Grandfather        d.62s unspecified type   Liver cancer Maternal Aunt    Cancer Maternal Uncle        unspecified type   Cancer Maternal Uncle        unspecified type   Myasthenia gravis Paternal Aunt    Breast cancer Cousin        d.60s-daughter of unaffected paternal aunt Eustace Moore   Colon cancer Cousin 15       d.70-daughter of unaffected maternal aunt Leila   Lung cancer Cousin 60       d.70-sisters to each other, both daughters of maternal uncle Johnny   Diabetes Neg Hx    Heart disease Neg Hx    Stomach cancer Neg Hx    Ulcerative colitis Neg Hx     Review of Systems  Constitutional: Negative.   Genitourinary: Negative.    Exam:   BP 127/79 (BP Location: Right Arm, Patient Position: Sitting, Cuff Size: Small)   Pulse 70   Ht 5\' 2"  (1.575 m)   Wt 129 lb 12.8 oz (58.9 kg)   BMI 23.74 kg/m   Height: 5\' 2"  (157.5 cm)  General appearance: alert, cooperative and appears stated age Breasts: normal appearance, no masses or tenderness Abdomen: soft, non-tender; bowel sounds normal; no masses,  no  organomegaly Lymph nodes: Cervical, supraclavicular, and axillary nodes normal.  No abnormal inguinal nodes palpated Neurologic: Grossly normal  Pelvic:  External genitalia:  no lesions              Urethra:  normal appearing urethra with no masses, tenderness or lesions              Bartholins and Skenes: normal                 Vagina: normal appearing vagina with atrophic changes and no discharge, no lesions              Cervix: absent              Pap taken: No. Bimanual Exam:  Uterus:  uterus absent              Adnexa: no mass, fullness, tenderness               Rectovaginal: Confirms               Anus:  normal sphincter tone, no lesions  Chaperone, Octaviano Batty, CMA, was present for exam.  Assessment/Plan: 1. Encounter for gynecological examination without abnormal finding - pap smear not indicated - MMG ordered - colonoscopy 2019.  No follow up recommended. - BMD 04/04/2020 - screening lab work done with Dr. [name]  - vaccines updated  2. History of breast cancer  3. Postmenopausal - no HRT  4. S/P TAH-BSO  5. Ovarian tumor of borderline malignancy, right  6. Osteoporosis, post-menopausal

## 2021-06-15 ENCOUNTER — Telehealth: Payer: Self-pay

## 2021-06-15 DIAGNOSIS — M810 Age-related osteoporosis without current pathological fracture: Secondary | ICD-10-CM

## 2021-06-15 NOTE — Telephone Encounter (Signed)
Benefits submitted-pending. Next injection due after 07/12/21

## 2021-06-16 ENCOUNTER — Other Ambulatory Visit: Payer: Self-pay | Admitting: Neurology

## 2021-06-20 ENCOUNTER — Other Ambulatory Visit: Payer: Self-pay | Admitting: Internal Medicine

## 2021-06-20 DIAGNOSIS — J301 Allergic rhinitis due to pollen: Secondary | ICD-10-CM | POA: Diagnosis not present

## 2021-06-20 DIAGNOSIS — J3089 Other allergic rhinitis: Secondary | ICD-10-CM | POA: Diagnosis not present

## 2021-06-20 DIAGNOSIS — J449 Chronic obstructive pulmonary disease, unspecified: Secondary | ICD-10-CM

## 2021-06-21 ENCOUNTER — Other Ambulatory Visit: Payer: Self-pay | Admitting: Internal Medicine

## 2021-06-21 DIAGNOSIS — J449 Chronic obstructive pulmonary disease, unspecified: Secondary | ICD-10-CM

## 2021-06-22 ENCOUNTER — Telehealth: Payer: Self-pay | Admitting: Family Medicine

## 2021-06-22 NOTE — Addendum Note (Signed)
Addended by: Kris Mouton on: 06/22/2021 12:20 PM   Modules accepted: Orders

## 2021-06-22 NOTE — Telephone Encounter (Signed)
Pt came in office to drop off handicap form. Placed in provider box

## 2021-06-22 NOTE — Telephone Encounter (Signed)
Lvm asking pt to call back.  Need to the reason she needs parking placard.   [Placed form in basket on Lisa's desk.]

## 2021-06-22 NOTE — Telephone Encounter (Signed)
Benefits received.  PA Approved  PA Case: [id number], Status: Approved, Coverage Starts on: 05/06/2020 12:00:00 AM,  Coverage Ends on: 11/25/2021 12:00:00 AM.  OOP cost is $0.  Spoke with patient. Lab appt on 07/06/21 and NV appt 07/12/21

## 2021-06-23 ENCOUNTER — Encounter: Payer: Medicare PPO | Admitting: Family Medicine

## 2021-06-23 NOTE — Telephone Encounter (Signed)
Crystal Bass returning phone call. Related the message and she stated its for COPD, difficulty breathing.

## 2021-06-23 NOTE — Telephone Encounter (Signed)
Noted.    Spoke with pt asking if she has to take breaks when walking (about 200 ft).  Pt confirms she does, especially in this hot weather.  Informed pt we will let her know when form is ready to pick up.  Pt expresses her thanks.   Documented on form and placed form in Dr. Synthia Innocent box.

## 2021-06-26 DIAGNOSIS — S42032A Displaced fracture of lateral end of left clavicle, initial encounter for closed fracture: Secondary | ICD-10-CM

## 2021-06-26 HISTORY — DX: Displaced fracture of lateral end of left clavicle, initial encounter for closed fracture: S42.032A

## 2021-06-26 NOTE — Telephone Encounter (Signed)
Filled and in Lisa's box 

## 2021-06-26 NOTE — Telephone Encounter (Signed)
Spoke with pt notifying her the form is ready to pick up.   [Placed form at front office- yellow folders.] 

## 2021-06-27 DIAGNOSIS — J3089 Other allergic rhinitis: Secondary | ICD-10-CM | POA: Diagnosis not present

## 2021-06-27 DIAGNOSIS — J301 Allergic rhinitis due to pollen: Secondary | ICD-10-CM | POA: Diagnosis not present

## 2021-06-29 ENCOUNTER — Ambulatory Visit: Payer: Medicare PPO | Admitting: Nurse Practitioner

## 2021-06-29 ENCOUNTER — Telehealth: Payer: Self-pay

## 2021-06-29 NOTE — Telephone Encounter (Signed)
Vm from pt stating she was scratched by a dog last week on left arm.  [facility] nurses have been caring for wound and changing dressings.  However, pt wants Dr. Darnell Level to take a look at it.  Spoke with pt scheduling OV tomorrow at 10:30 with Dr. Darnell Level.

## 2021-06-30 ENCOUNTER — Encounter: Payer: Self-pay | Admitting: Family Medicine

## 2021-06-30 ENCOUNTER — Ambulatory Visit: Payer: Medicare PPO | Admitting: Family Medicine

## 2021-06-30 ENCOUNTER — Other Ambulatory Visit: Payer: Self-pay

## 2021-06-30 VITALS — BP 100/70 | HR 78 | Temp 98.3°F | Ht 62.0 in | Wt 130.6 lb

## 2021-06-30 DIAGNOSIS — T148XXA Other injury of unspecified body region, initial encounter: Secondary | ICD-10-CM | POA: Insufficient documentation

## 2021-06-30 DIAGNOSIS — Z23 Encounter for immunization: Secondary | ICD-10-CM | POA: Diagnosis not present

## 2021-06-30 DIAGNOSIS — W548XXA Other contact with dog, initial encounter: Secondary | ICD-10-CM

## 2021-06-30 DIAGNOSIS — S41102A Unspecified open wound of left upper arm, initial encounter: Secondary | ICD-10-CM | POA: Diagnosis not present

## 2021-06-30 MED ORDER — CEPHALEXIN 500 MG PO CAPS: 500.0000 mg | ORAL_CAPSULE | Freq: Three times a day (TID) | ORAL | 0 refills | Status: DC

## 2021-06-30 NOTE — Patient Instructions (Signed)
Wound looking ok.  Continue daily dressing changes as up to now.  May take keflex antibiotic three times daily for 5 days.  Let us know if not improving over time.

## 2021-06-30 NOTE — Assessment & Plan Note (Signed)
No significant streaking redness or discharge present today. Wound looks dry.  Continue dressing changes as per [facility] as up to now.  Will update Td.  Will add keflex short 5d course to cover any developing infection.  Pt agrees with plan.

## 2021-06-30 NOTE — Progress Notes (Signed)
Patient ID: SAMOYA HOCHMAN, female    DOB: January 02, 1941, 80 y.o.   MRN: [id number]  This visit was conducted in person.  BP 100/70   Pulse 78   Temp 98.3 F (36.8 C) (Temporal)   Ht '5\' 2"'$  (1.575 m)   Wt 130 lb 9 oz (59.2 kg)   SpO2 93%   BMI 23.88 kg/m    CC: wound check Subjective:   HPI: MARIO YOHN is a 80 y.o. female presenting on 06/30/2021 for Wound Check   DOI: 06/22/2021 Dog scratch last week to L arm - jumped onto her lab, scratched her L upper lateral arm just proximal to elbow. [facility] nurse has been caring for wound and changing dressing daily with vaseline gauze. Dressing changes almost daily. Feeling itchy, not painful, no drainage or pus, no streaking redness. No fevers/chills.      Relevant past medical, surgical, family and social history reviewed and updated as indicated. Interim medical history since our last visit reviewed. Allergies and medications reviewed and updated. Outpatient Medications Prior to Visit  Medication Sig Dispense Refill   acetaminophen (TYLENOL) 500 MG tablet Take 2 tablets (1,000 mg total) by mouth 2 (two) times daily as needed. (Patient taking differently: Take 1,000 mg by mouth 2 (two) times daily as needed for mild pain or moderate pain.)     albuterol (VENTOLIN HFA) 108 (90 Base) MCG/ACT inhaler Inhale 2 puffs into the lungs every 6 (six) hours as needed for wheezing or shortness of breath. 18 g 1   amLODipine (NORVASC) 2.5 MG tablet TAKE 1 TABLET BY MOUTH ONCE A DAY 30 tablet 6   aspirin 81 MG tablet Take 1 tablet (81 mg total) by mouth daily. 30 tablet    Carbidopa-Levodopa ER (SINEMET CR) 25-100 MG tablet controlled release 2 at 9am, 1 at 1pm, 1 at 5pm 360 tablet 1   clonazePAM (KLONOPIN) 0.5 MG tablet TAKE 1/2 A TABLET BY MOUTH AT BEDTIME 45 tablet 1   EPINEPHrine 0.3 mg/0.3 mL IJ SOAJ injection      gabapentin ([name]) 300 MG capsule TAKE 2 CAPSULES BY MOUTH 3 TIMES DAILY 540 capsule 1   omeprazole (PRILOSEC) 20 MG  capsule Take 1 capsule (20 mg total) by mouth daily. 30 capsule 11   TRELEGY ELLIPTA 100-62.5-25 MCG/INH AEPB INHALE 1 PUFF BY MOUTH ONCE A DAY 60 each 5   No facility-administered medications prior to visit.     Per HPI unless specifically indicated in ROS section below Review of Systems  Objective:  BP 100/70   Pulse 78   Temp 98.3 F (36.8 C) (Temporal)   Ht '5\' 2"'$  (1.575 m)   Wt 130 lb 9 oz (59.2 kg)   SpO2 93%   BMI 23.88 kg/m   Wt Readings from Last 3 Encounters:  06/30/21 130 lb 9 oz (59.2 kg)  06/14/21 129 lb 12.8 oz (58.9 kg)  03/24/21 134 lb (60.8 kg)      Physical Exam Vitals and nursing note reviewed.  Constitutional:      Appearance: Normal appearance. She is not ill-appearing.  Skin:    General: Skin is warm and dry.     Findings: Ecchymosis and wound present. No erythema or rash.     Comments:  3.7 x 1.3cm wound to skin of distal lateral upper left arm just proximal to elbow  Briusing /ecchymoses present distal to wound   Neurological:     Mental Status: She is alert.  Lab Results  Component Value Date   CREATININE 0.94 01/05/2021   BUN 20 01/05/2021   NA 141 01/05/2021   K 3.5 01/05/2021   CL 104 01/05/2021   CO2 28 01/05/2021    Assessment & Plan:  This visit occurred during the SARS-CoV-2 public health emergency.  Safety protocols were in place, including screening questions prior to the visit, additional usage of staff PPE, and extensive cleaning of exam room while observing appropriate contact time as indicated for disinfecting solutions.   Problem List Items Addressed This Visit     Open wound of skin - Primary    No significant streaking redness or discharge present today. Wound looks dry.  Continue dressing changes as per [facility] as up to now.  Will update Td.  Will add keflex short 5d course to cover any developing infection.  Pt agrees with plan.        Relevant Orders   Td : Tetanus/diphtheria >7yo Preservative  free  (Completed)   Other Visit Diagnoses     Dog scratch       Relevant Orders   Td : Tetanus/diphtheria >7yo Preservative  free (Completed)        Meds ordered this encounter  Medications   cephALEXin (KEFLEX) 500 MG capsule    Sig: Take 1 capsule (500 mg total) by mouth 3 (three) times daily.    Dispense:  15 capsule    Refill:  0    Orders Placed This Encounter  Procedures   Td : Tetanus/diphtheria >7yo Preservative  free     Patient Instructions  Wound looking ok.  Continue daily dressing changes as up to now.  May take keflex antibiotic three times daily for 5 days.  Let us know if not improving over time.   Follow up plan: No follow-ups on file.  [name], MD

## 2021-07-04 ENCOUNTER — Encounter: Payer: Self-pay | Admitting: Neurology

## 2021-07-04 DIAGNOSIS — J301 Allergic rhinitis due to pollen: Secondary | ICD-10-CM | POA: Diagnosis not present

## 2021-07-04 DIAGNOSIS — J3081 Allergic rhinitis due to animal (cat) (dog) hair and dander: Secondary | ICD-10-CM | POA: Diagnosis not present

## 2021-07-04 DIAGNOSIS — J3089 Other allergic rhinitis: Secondary | ICD-10-CM | POA: Diagnosis not present

## 2021-07-06 ENCOUNTER — Other Ambulatory Visit (INDEPENDENT_AMBULATORY_CARE_PROVIDER_SITE_OTHER): Payer: Medicare PPO

## 2021-07-06 ENCOUNTER — Other Ambulatory Visit: Payer: Self-pay

## 2021-07-06 DIAGNOSIS — M810 Age-related osteoporosis without current pathological fracture: Secondary | ICD-10-CM

## 2021-07-06 LAB — BASIC METABOLIC PANEL
BUN: 15 mg/dL (ref 6–23)
CO2: 30 mEq/L (ref 19–32)
Calcium: 9 mg/dL (ref 8.4–10.5)
Chloride: 102 mEq/L (ref 96–112)
Creatinine, Ser: 0.76 mg/dL (ref 0.40–1.20)
GFR: 75.11 mL/min (ref >60.00)
Glucose, Bld: 93 mg/dL (ref 70–99)
Potassium: 3.8 mEq/L (ref 3.5–5.1)
Sodium: 138 mEq/L (ref 135–145)

## 2021-07-06 NOTE — Telephone Encounter (Signed)
Labs done on 07/06/21 Calcium normal 9.0 CrCL : 57.01 mL/min  Ok to proceed per current up to date guidelines.

## 2021-07-10 ENCOUNTER — Encounter: Payer: Self-pay | Admitting: Nurse Practitioner

## 2021-07-10 ENCOUNTER — Telehealth: Payer: Medicare PPO | Admitting: Nurse Practitioner

## 2021-07-10 DIAGNOSIS — U071 COVID-19: Secondary | ICD-10-CM | POA: Diagnosis not present

## 2021-07-10 MED ORDER — MOLNUPIRAVIR EUA 200MG CAPSULE: 4.0000 | ORAL_CAPSULE | Freq: Two times a day (BID) | ORAL | 0 refills | Status: AC

## 2021-07-10 NOTE — Patient Instructions (Signed)
You are being prescribed MOLNUPIRAVIR for COVID-19 infection.  ° ° °Please call the pharmacy or go through the drive through vs going inside if you are picking up the mediation yourself to prevent further spread. If prescribed to a [place] affiliated pharmacy, a pharmacist will bring the medication out to your car. ° ° °ADMINISTRATION INSTRUCTIONS: °Take with or without food. Swallow the tablets whole. Don't chew, crush, or break the medications because it might not work as well ° °For each dose of the medication, you should be taking FOUR tablets at one time, TWICE a day  ° °Finish your full five-day course of Molnupiravir even if you feel better before you're done. Stopping this medication too early can make it less effective to prevent severe illness related to COVID19.   ° °Molnupiravir is prescribed for YOU ONLY. Don't share it with others, even if they have similar symptoms as you. This medication might not be right for everyone.  ° °Make sure to take steps to protect yourself and others while you're taking this medication in order to get well soon and to prevent others from getting sick with COVID-19. ° ° °**If you are of childbearing potential (any gender) - it is advised to not get pregnant while taking this medication and recommended that condoms are used for female partners the next 3 months after taking the medication out of extreme caution  ° ° °COMMON SIDE EFFECTS: °Diarrhea °Nausea  °Dizziness ° ° ° °If your COVID-19 symptoms get worse, get medical help right away. Call 911 if you experience symptoms such as worsening cough, trouble breathing, chest pain that doesn't go away, confusion, a hard time staying awake, and pale or blue-colored skin. °This medication won't prevent all COVID-19 cases from getting worse.  ° ° °

## 2021-07-10 NOTE — Progress Notes (Signed)
Virtual Visit Consent   Crystal Bass, you are scheduled for a virtual visit with a [place] provider today.     Just as with appointments in the office, your consent must be obtained to participate.  Your consent will be active for this visit and any virtual visit you may have with one of our providers in the next 365 days.     If you have a MyChart account, a copy of this consent can be sent to you electronically.  All virtual visits are billed to your insurance company just like a traditional visit in the office.    As this is a virtual visit, video technology does not allow for your provider to perform a traditional examination.  This may limit your provider's ability to fully assess your condition.  If your provider identifies any concerns that need to be evaluated in person or the need to arrange testing (such as labs, EKG, etc.), we will make arrangements to do so.     Although advances in technology are sophisticated, we cannot ensure that it will always work on either your end or our end.  If the connection with a video visit is poor, the visit may have to be switched to a telephone visit.  With either a video or telephone visit, we are not always able to ensure that we have a secure connection.     I need to obtain your verbal consent now.   Are you willing to proceed with your visit today?    Crystal Bass has provided verbal consent on 07/10/2021 for a virtual visit (video or telephone).   Apolonio Schneiders, FNP   Date: 07/10/2021 11:11 AM   Virtual Visit via Video Note   I, Apolonio Schneiders, connected with  Crystal Bass  ([id number], 1941/01/19) on 07/10/21 at 11:00 AM EDT by a video-enabled telemedicine application and verified that I am speaking with the correct person using two identifiers.  Location: Patient: Virtual Visit Location Patient: Home Provider: Virtual Visit Location Provider: Office/Clinic   I discussed the limitations of evaluation and management by  telemedicine and the availability of in person appointments. The patient expressed understanding and agreed to proceed.    History of Present Illness: Crystal Bass is a 80 y.o. who identifies as a female who was assigned female at birth, and is being seen today after testing positive for COVID-19 with a home test with help of a nurse at [facility].   She started to feel sick over the past weekend, today is day 3 of symptoms. Initially she thought she was just having allergies but over the weekend she started to feel worse.  Denies a fever now, possibly low grade yesterday.   Today her main symptoms are a headache and sore throat she feels her skin is sensitive to touch.   She has not had a COVID infection in the past  She has been vaccinated with two boosters Moderna x4   She has a history of COPD and Asthma, uses her inhaler daily.     Problems:  Patient Active Problem List   Diagnosis Date Noted   Open wound of skin 06/30/2021   Knee injury, initial encounter 02/27/2021   Ovarian tumor of borderline malignancy, right 10/28/2020   Nephrolithiasis 08/20/2020   Atherosclerosis of aorta ([place]) 08/20/2020   Angioedema 05/04/2020   Mitral valve prolapse 04/08/2020   Medicare annual wellness visit, subsequent 01/27/2020   REM sleep behavior disorder 01/27/2020   Chronic  neuropathic pain 01/27/2020   Acute midline low back pain with right-sided sciatica 10/26/2019   Orthostatic syncope 10/26/2019   [name] (mycobacterium avium-intracellulare) infection ([place]) 11/01/2018   History of pulmonary embolism 10/31/2018   Orthostatic hypotension 08/25/2018   Renal cyst, acquired, right 02/02/2018   Advanced care planning/counseling discussion 01/26/2018   Liver lesion 01/21/2018   Vitamin B12 deficiency 01/21/2018   Chronic fatigue 10/23/2017   Lesion of left lung 10/23/2017   Genetic testing 05/01/2017   Health maintenance examination 12/27/2016   Parkinson disease ([place]) 01/10/2016    DNR (do not resuscitate) 05/22/2015   Tremor 02/07/2015   Dysphagia, pharyngoesophageal phase 02/07/2015   Hypercalcemia 09/14/2013   Vitamin D deficiency 04/27/2013   Osteoporosis, post-menopausal 03/14/2012   Chronic dyspnea 03/28/2011   Thoracic aortic aneurysm without rupture ([place]) 01/01/2011   OSTEOARTHRITIS, CERVICAL SPINE 08/15/2009   ALLERGIC RHINITIS 03/25/2008   Chronic obstructive airway disease with asthma ([place]) 03/25/2008   History of left breast cancer 03/10/2008   Essential hypertension 02/10/2007    Allergies:  Allergies  Allergen Reactions   Sulfonamide Derivatives Anaphylaxis   Topamax [Topiramate] Other (See Comments)    Metabolic acidosis    Biaxin [Clarithromycin] Other (See Comments)    pericarditis   Hydrocodone Other (See Comments)    "hyper and climbing the walls"   Motrin [Ibuprofen] Other (See Comments)    headaches   Sulfa Antibiotics     Other reaction(s): Unknown   Symbicort [Budesonide-Formoterol Fumarate] Other (See Comments)    02/07/15 tremor   Tetracycline Hcl Other (See Comments)   Percocet [Oxycodone-Acetaminophen] Itching and Rash   Tape Itching and Rash    Use paper tape only   Tetracyclines & Related Other (See Comments)    "immediate yeast infection"   Medications:  Current Outpatient Medications:    acetaminophen (TYLENOL) 500 MG tablet, Take 2 tablets (1,000 mg total) by mouth 2 (two) times daily as needed. (Patient taking differently: Take 1,000 mg by mouth 2 (two) times daily as needed for mild pain or moderate pain.), Disp: , Rfl:    albuterol (VENTOLIN HFA) 108 (90 Base) MCG/ACT inhaler, Inhale 2 puffs into the lungs every 6 (six) hours as needed for wheezing or shortness of breath., Disp: 18 g, Rfl: 1   amLODipine (NORVASC) 2.5 MG tablet, TAKE 1 TABLET BY MOUTH ONCE A DAY, Disp: 30 tablet, Rfl: 6   aspirin 81 MG tablet, Take 1 tablet (81 mg total) by mouth daily., Disp: 30 tablet, Rfl:    Carbidopa-Levodopa ER (SINEMET CR)  25-100 MG tablet controlled release, 2 at 9am, 1 at 1pm, 1 at 5pm, Disp: 360 tablet, Rfl: 1   cephALEXin (KEFLEX) 500 MG capsule, Take 1 capsule (500 mg total) by mouth 3 (three) times daily., Disp: 15 capsule, Rfl: 0   clonazePAM (KLONOPIN) 0.5 MG tablet, TAKE 1/2 A TABLET BY MOUTH AT BEDTIME, Disp: 45 tablet, Rfl: 1   EPINEPHrine 0.3 mg/0.3 mL IJ SOAJ injection, , Disp: , Rfl:    gabapentin ([name]) 300 MG capsule, TAKE 2 CAPSULES BY MOUTH 3 TIMES DAILY, Disp: 540 capsule, Rfl: 1   omeprazole (PRILOSEC) 20 MG capsule, Take 1 capsule (20 mg total) by mouth daily., Disp: 30 capsule, Rfl: 11   TRELEGY ELLIPTA 100-62.5-25 MCG/INH AEPB, INHALE 1 PUFF BY MOUTH ONCE A DAY, Disp: 60 each, Rfl: 5  Observations/Objective: Patient is well-developed, well-nourished in no acute distress.  Resting comfortably at home.  Head is normocephalic, atraumatic.  No labored breathing.  Speech is  clear and coherent with logical content.  Patient is alert and oriented at baseline.    Assessment and Plan: 1. COVID-19  - molnupiravir EUA 200 mg CAPS; Take 4 capsules (800 mg total) by mouth 2 (two) times daily for 5 days.  Dispense: 40 capsule; Refill: 0    Discussed over the counter medications that can be used.  Advised follow up with PCP this week.  Monitor SpO2 at home with help of [facility] nurse   Follow Up Instructions: I discussed the assessment and treatment plan with the patient. The patient was provided an opportunity to ask questions and all were answered. The patient agreed with the plan and demonstrated an understanding of the instructions.  A copy of instructions were sent to the patient via MyChart.  The patient was advised to call back or seek an in-person evaluation if the symptoms worsen or if the condition fails to improve as anticipated.  Time:  I spent 15 minutes with the patient via telehealth technology discussing the above problems/concerns.    Apolonio Schneiders, FNP

## 2021-07-11 NOTE — Telephone Encounter (Signed)
Noted  

## 2021-07-11 NOTE — Telephone Encounter (Signed)
Patient cancelled nurse visit appointment for 07/12/21. Patient has COVID. Symptoms started on 07/08/21 per chart notes. Dr. Darnell Level can patient get Prolia 10 days after her symptoms started or 10 days after been tested positive for Covid or longer? Please advise. Thank you.  Patient has an appointment with you on 07/17/21 per appointment notes.

## 2021-07-11 NOTE — Telephone Encounter (Signed)
Yes we can do Prolia then. I will keep an eye on appointment to make sure patient keeps it and is feeling better.

## 2021-07-11 NOTE — Telephone Encounter (Signed)
Noted positive COVID started on molnupiravir yesterday. Crystal Bass-  plz call to check on pt tomorrow. [name] - ok to keep appt with me 8/22 as long as feeling better, can we do prolia at that time?

## 2021-07-12 ENCOUNTER — Telehealth: Payer: Self-pay | Admitting: Family Medicine

## 2021-07-12 ENCOUNTER — Ambulatory Visit: Payer: Medicare PPO

## 2021-07-12 NOTE — Telephone Encounter (Signed)
Opening new phone note instead of continuing conversation from phone note 3 wks ago...  Noted positive COVID started on molnupiravir 07/10/2021.  Pt endorses bad sore throat.  Would recommend start with tylenol '1000mg'$  TID. She can also use OTC throat lozenges, honey with lemon, herbal tea or popsicles whichever soothes better.  Can she tolerate some ibuprofen?

## 2021-07-12 NOTE — Telephone Encounter (Signed)
Spoke with pt asking for an update.  States she feels about the same except her sore throat is worse.  Says it's really painful.  Pt asks if there is anything she can take for relief.  Plz advise.

## 2021-07-13 ENCOUNTER — Telehealth: Payer: Self-pay

## 2021-07-13 NOTE — Telephone Encounter (Signed)
Noted.  See other phone note.

## 2021-07-13 NOTE — Telephone Encounter (Addendum)
Spoke with pt relaying Dr. Synthia Innocent message.  Pt verbalizes understanding and states her throat is a little better today.  Says she cannot tolerate ibuprofen.    Also, pt states she has now developed the itching and rash she previously had (assuming due to abx) in the palms of hands, bottom of feet and groin area.  Pt asks can she take Benadryl?  Plz advise.

## 2021-07-13 NOTE — Telephone Encounter (Addendum)
Would suggest bid prn itching

## 2021-07-13 NOTE — Telephone Encounter (Signed)
Spoke with pt relaying Dr. Synthia Innocent message.  Pt verbalizes understanding but wants to continue the full duration of antiviral so she doesn't have a relapse of COVID.  But she will take the Benadryl but asks how often to take the 1/2 tab.  Plz advise.

## 2021-07-13 NOTE — Telephone Encounter (Signed)
Patient was given Lagevrio on 07/08/2021 by Apolonio Schneiders, FNP from video visit for Covid. Started having itching yesterday on palms of hands insteps of feet off and on other locations of body. Denies any whelps or bumps just no change in skin at all. All areas with itching are in groin and under arms. Denies any swelling in mouth or face. She denies any increased shortness of breath. The last time she took the Lagevrio was about 8:30am and next scheduled dose will be at 8:30pm tonight. She has used cortisone cream with no improvement. She has benadryl but has not taken it. Wanted to make sure ok with Dr. Darnell Level before she did. I have reviewed red words with patient and will call 911 if any swelling of face, lips , tongue or mouth or if any increased shortness of breath.   Have updated and reviewed current medication list as well.   Covid symptoms are improving and worst ones bing the sore throat and congestion.

## 2021-07-13 NOTE — Telephone Encounter (Signed)
This could be side effect to molnupiravir (itching).  If itching is bothersome, would stop med, especially if she is doing better from COVID standpoint. Would recommend supportive measures such as moisturizer cream to itchy areas, could try warm bath, avoid too hot water though.  Could try 1/2 tablet of benadryl '25mg'$  as well.

## 2021-07-14 NOTE — Telephone Encounter (Signed)
Spoke with pt relaying Dr. G's message. Pt verbalizes understanding.  

## 2021-07-14 NOTE — Telephone Encounter (Signed)
Spoke with patient. Appt on 8/22 was cancelled-was not needed. I re scheduled patient to 07/25/21 for nurse visit.

## 2021-07-17 ENCOUNTER — Telehealth: Payer: Self-pay

## 2021-07-17 ENCOUNTER — Ambulatory Visit: Payer: Medicare PPO | Admitting: Family Medicine

## 2021-07-17 DIAGNOSIS — S42035A Nondisplaced fracture of lateral end of left clavicle, initial encounter for closed fracture: Secondary | ICD-10-CM | POA: Diagnosis not present

## 2021-07-17 DIAGNOSIS — S42002A Fracture of unspecified part of left clavicle, initial encounter for closed fracture: Secondary | ICD-10-CM | POA: Insufficient documentation

## 2021-07-17 NOTE — Telephone Encounter (Signed)
Noted. Agree with ortho eval today.  Plz call tomorrow for update on visit.

## 2021-07-17 NOTE — Telephone Encounter (Signed)
Pt called triage line stating she fell yesterday and is having a lot of pain in her left shoulder. The nurse at [facility] is concerned about a fracture. Want to be seen today. I advised her that we did not have availability today and we do not have X-ray. I gave her information on [facility] in [place]. She will go there to be seen. She also mentioned she had a cut that may have needed some strips on it. She will show that to them and call if she needs anything about that.   FYI to Dr [name].

## 2021-07-20 NOTE — Telephone Encounter (Signed)
Left message for patient to call back  

## 2021-07-21 NOTE — Telephone Encounter (Addendum)
Patient called back. Patient did go to [facility] and was told she has cracked collar bone on the left side and bruising. They ut her in a sling. Suppose to follow up on Monday 07/24/21 with them if she can find a ride. Patient is taking Tylenol for the pain.  Patient has Prolia appointment on 07/25/21 is this still ok to do?

## 2021-07-24 DIAGNOSIS — S42002A Fracture of unspecified part of left clavicle, initial encounter for closed fracture: Secondary | ICD-10-CM | POA: Diagnosis not present

## 2021-07-25 ENCOUNTER — Other Ambulatory Visit: Payer: Self-pay

## 2021-07-25 ENCOUNTER — Ambulatory Visit (INDEPENDENT_AMBULATORY_CARE_PROVIDER_SITE_OTHER): Payer: Medicare PPO

## 2021-07-25 DIAGNOSIS — M810 Age-related osteoporosis without current pathological fracture: Secondary | ICD-10-CM

## 2021-07-25 MED ORDER — DENOSUMAB 60 MG/ML ~~LOC~~ SOSY
60.0000 mg | PREFILLED_SYRINGE | Freq: Once | SUBCUTANEOUS | Status: AC
  Administered 2021-07-25: 60 mg via SUBCUTANEOUS

## 2021-07-25 NOTE — Progress Notes (Signed)
Per orders of Dr. [name], injection of Prolia was given by [facility] Shoulder. Patient tolerated injection well.

## 2021-07-26 ENCOUNTER — Encounter: Payer: Self-pay | Admitting: Family Medicine

## 2021-07-26 ENCOUNTER — Other Ambulatory Visit: Payer: Self-pay | Admitting: Family Medicine

## 2021-08-05 ENCOUNTER — Ambulatory Visit (INDEPENDENT_AMBULATORY_CARE_PROVIDER_SITE_OTHER): Payer: Medicare PPO

## 2021-08-05 DIAGNOSIS — Z0000 Encounter for general adult medical examination without abnormal findings: Secondary | ICD-10-CM

## 2021-08-05 NOTE — Progress Notes (Signed)
Subjective:   Crystal Bass is a 80 y.o. female who presents for Medicare Annual (Subsequent) preventive examination.  I connected with  Crystal Bass on 08/05/21 by an audio only telemedicine application and verified that I am speaking with the correct person using two identifiers.   I discussed the limitations, risks, security and privacy concerns of performing an evaluation and management service by telephone and the availability of in person appointments. I also discussed with the patient that there may be a patient responsible charge related to this service. The patient expressed understanding and verbally consented to this telephonic visit.  Location of Patient:  Location of Provider:  Persons participating in the visit: Crystal Bass, and Crystal Bass, RMA Review of Systems    Defer to PCP       Objective:    There were no vitals filed for this visit. There is no height or weight on file to calculate BMI.  Advanced Directives 08/05/2021 03/24/2021 09/15/2020 04/01/2020 10/02/2019 09/16/2019 01/19/2019  Does Patient Have a Medical Advance Directive? Yes Yes Yes Yes Yes Yes Yes  Type of Paramedic of [place];Living will;Out of facility DNR (pink MOST or yellow form) [place];Living will;Out of facility DNR (pink MOST or yellow form) [place];Living will - - - [place];Living will  Does patient want to make changes to medical advance directive? No - Patient declined - - - - - No - Patient declined  Copy of [place] in Chart? - - - - - - Yes - validated most recent copy scanned in chart (See row information)  Would patient like information on creating a medical advance directive? - - - - - - No - Patient declined  Pre-existing out of facility DNR order (yellow form or pink MOST form) - - - - - - -    Current Medications (verified) Outpatient Encounter  Medications as of 08/05/2021  Medication Sig   acetaminophen (TYLENOL) 500 MG tablet Take 2 tablets (1,000 mg total) by mouth 2 (two) times daily as needed. (Patient taking differently: Take 1,000 mg by mouth 2 (two) times daily as needed for mild pain or moderate pain.)   albuterol (VENTOLIN HFA) 108 (90 Base) MCG/ACT inhaler Inhale 2 puffs into the lungs every 6 (six) hours as needed for wheezing or shortness of breath.   amLODipine (NORVASC) 2.5 MG tablet TAKE 1 TABLET BY MOUTH ONCE A DAY   aspirin 81 MG tablet Take 1 tablet (81 mg total) by mouth daily.   Carbidopa-Levodopa ER (SINEMET CR) 25-100 MG tablet controlled release 2 at 9am, 1 at 1pm, 1 at 5pm   clonazePAM (KLONOPIN) 0.5 MG tablet TAKE 1/2 A TABLET BY MOUTH AT BEDTIME   EPINEPHrine 0.3 mg/0.3 mL IJ SOAJ injection    gabapentin ([name]) 300 MG capsule TAKE 2 CAPSULES BY MOUTH 3 TIMES DAILY   omeprazole (PRILOSEC) 20 MG capsule Take 1 capsule (20 mg total) by mouth daily.   TRELEGY ELLIPTA 100-62.5-25 MCG/INH AEPB INHALE 1 PUFF BY MOUTH ONCE A DAY   No facility-administered encounter medications on file as of 08/05/2021.    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   Aortic root aneurysm (  [place])    Arthritis    Asthma    Breast cancer ([place]) 2002   infiltrative ductal carcinoma    Cataract 2019   COPD (chronic obstructive pulmonary disease) ([place]) 07/2007   Ductal carcinoma of breast, estrogen receptor positive, stage 1 ([place]) 09/16/2012   Endometriosis    GERD (gastroesophageal reflux disease)    Hypercalcemia 09/14/2013   Hypertension    Lesion of breast 1992   right, benign    Lung disease    secondary to [name] infection   Mastalgia 05/1994   Osteoporosis, post-menopausal 03/14/2012   Ovarian tumor of borderline malignancy, right 2004   Pancreatitis    secondary to Cholelithiasis   Parkinson disease ([place]) 02/10/2015   Post-thoracotomy pain syndrome    Pseudomonas pneumonia ([place]) 03/2008   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   APPENDECTOMY  2004   BREAST BIOPSY  08/2004   right breast-benign   BREAST LUMPECTOMY  1992   benign   CARDIAC CATHETERIZATION  2007   essentially negation for significant CAD   CHOLECYSTECTOMY  2001   COLONOSCOPY  2014   Dr [name] , due 2019   COLONOSCOPY  09/2018   2 TA removed, int hem, no f/u needed (Dr [name])    ESOPHAGOGASTRODUODENOSCOPY  09/2018   GERD with esophagitis and stricture dilated, antral gastritis negative for H pylori [name])   LUNG BIOPSY  2002   [name], Dr [name]   MASTECTOMY MODIFIED RADICAL Left 2002   oral chemotheraphy (tamoxifen then Armidex) no radiation, Dr.[name]   TEE WITHOUT CARDIOVERSION N/A 12/29/2018   Procedure: TRANSESOPHAGEAL ECHOCARDIOGRAM (TEE);  Surgeon: Wellington Hampshire, MD;  Location: [facility] ORS;  Service: Cardiovascular;  Laterality: N/A;   TONSILLECTOMY  1946   Family History  Problem Relation Age of Onset   Emphysema Mother        d.64 was never a smoker   Lung cancer Father 56       d.82 history of smoking   Breast cancer Sister 83   Colon cancer Sister    Melanoma Maternal Aunt    Liver cancer Maternal Aunt    Cancer Maternal Uncle        unspecified type   Cancer Maternal Uncle        unspecified type   Cancer Maternal Uncle        unspecified type   Stroke Paternal Aunt        in mid 3s   Osteoporosis  Paternal Aunt    Myasthenia gravis Paternal Aunt    Cancer Maternal Grandmother        d.82s unspecified GI cancer   Cancer Maternal Grandfather        d.62s unspecified type   Breast cancer Cousin        d.60s-daughter of unaffected paternal aunt Eustace Moore   Colon cancer Cousin 75       d.70-daughter of unaffected maternal aunt Leila   Lung cancer Cousin 54       d.70-sisters to each other, both daughters of maternal uncle Johnny   Parkinson's disease Cousin    Diabetes Neg Hx    Heart disease Neg Hx    Stomach cancer Neg Hx    Ulcerative  colitis Neg Hx    Social History   Socioeconomic History   Marital status: Widowed    Spouse name: Not on file   Number of children: 0   Years of education: Not on file   Highest education level: Not on file  Occupational History   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, 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, 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  Tobacco Use   Smoking status: Never   Smokeless tobacco: Never  Vaping Use   Vaping Use: Never used  Substance and Sexual Activity   Alcohol use: No    Alcohol/week: 0.0 standard drinks   Drug use: No   Sexual activity: Not Currently    Birth control/protection: Surgical    Comment: TAH/BSO  Other Topics Concern   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   Aunt of Margaretmary Eddy   Lives independent living at [facility]   No children   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, 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, 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, 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)   Activity: walking about 1 mile/day, enjoys [employer]   Diet: some water, fruits/vegetables some   Right handed   Social Determinants of Radio broadcast assistant Strain: Low Risk    Difficulty of Paying Living Expenses: Not hard at all  Food Insecurity: No Food Insecurity   Worried About Charity fundraiser in the Last Year: Never true   Arboriculturist in the Last Year: Never true  Transportation Needs: No Transportation Needs   Lack of Transportation (Medical): No   Lack of Transportation (Non-Medical): No   Physical Activity: Inactive   Days of Exercise per Week: 0 days   Minutes of Exercise per Session: 0 min  Stress: No Stress Concern Present   Feeling of Stress : Not at all  Social Connections: Socially Isolated   Frequency of Communication with Friends and Family: More than three times a week   Frequency of Social Gatherings with Friends and Family: Three times a week   Attends Religious Services: Never   Active Member of Clubs or Organizations: No   Attends Archivist Meetings: Never   Marital Status: Widowed    Tobacco Counseling Counseling given: Not Answered   Clinical Intake:  Pre-visit preparation completed: Yes  Pain : No/denies pain     Nutritional Risks: None Diabetes: No  How often do you need to have someone help you when you read instructions, pamphlets, or other written materials from your doctor or pharmacy?: 1 - Never What is the last grade level you completed in school?: post graduate  Diabetic?No  Interpreter Needed?: No      Activities of Daily Living In your present state of health, do you have any difficulty performing the following activities: 08/05/2021  Hearing? N  Vision? Y  Difficulty concentrating or making decisions? Y  Comment maybe some issues with concentration  Walking or climbing stairs? N  Dressing or bathing? Y  Comment right now due to left arm is in a swing but after 3 weeks this will improve  Doing errands, shopping? Y  Some recent data might be hidden    Patient Care Team: [name], MD as PCP - General (Family Medicine) Wellington Hampshire, MD as PCP - Cardiology (Cardiology) Mosetta Anis, MD as Referring Physician (Allergy) Megan Salon, MD as Consulting Physician (Gynecology) Tat, Eustace Quail, DO as Consulting Physician (Neurology) Nicholas Lose, MD as Consulting Physician (Hematology and Oncology) Tat, Eustace Quail, DO as Consulting Physician (Neurology) Dr Tucker-gyn/oncology  Indicate any recent  Medical Services you may have received from other than [facility] providers in the past year (date may be approximate).     Assessment:   This is a routine wellness examination for [place].  Hearing/Vision screen Vision Screening - Comments:: Sees Dr Cleotis Lema exam was over a year ago.  Dietary issues and exercise activities discussed: Current Exercise Habits: The patient does not participate in regular exercise at present, Exercise limited by: orthopedic condition(s);Other - see comments   Goals Addressed   None   Depression Screen PHQ 2/9 Scores 06/14/2021 04/01/2020 01/27/2020 01/19/2019 01/14/2018 12/27/2016 02/07/2015  PHQ - 2 Score 0 0 1 0 0 1 0  PHQ- 9 Score - - - 0 0 - -    Fall Risk Fall Risk  08/05/2021 03/24/2021 09/15/2020 04/01/2020 01/27/2020  Falls in the past year? 1 1 0 0 1  Comment - - - - -  Number falls in past yr: 0 1 0 0 0  Injury with Fall? 1 1 0 0 0  Comment - - - - -  Risk Factor Category  - - - - -  Risk for fall due to : History of fall(s) - - - -  Follow up - - - - -    FALL RISK PREVENTION PERTAINING TO THE HOME:  Any stairs in or around the home? No  Home free of loose throw rugs in walkways, pet beds, electrical cords, etc? No  Adequate lighting in your home to reduce risk of falls? Yes  ASSISTIVE DEVICES UTILIZED TO PREVENT FALLS:  Life alert? No  Use of a cane, walker or w/c? No  Grab bars in the bathroom? Yes  Shower chair or bench in shower? Yes  Elevated toilet seat or a handicapped toilet? Yes   TIMED UP AND GO:  Was the test performed?  n/a .  Length of time to ambulate 10 feet N/A   Cognitive Function: MMSE - Mini Mental State Exam 01/19/2019 01/14/2018 06/12/2017 12/27/2016  Not completed: - - Unable to complete -  Orientation to time 5 5 - 5  Orientation to Place 5 5 - 5  Registration 3 3 - 3  Attention/ Calculation 0 0 - 0  Recall 3 3 - 3  Language- name 2 objects 0 0 - 0  Language- repeat 1 1 - 1  Language- follow 3 step command 3 3 -  3  Language- read & follow direction 0 0 - 0  Write a sentence 0 0 - 0  Copy design 0 0 - 0  Total score 20 20 - 20   Montreal Cognitive Assessment  10/02/2016  Visuospatial/ Executive (0/5) 4  Naming (0/3) 3  Attention: Read list of digits (0/2) 2  Attention: Read list of letters (0/1) 1  Attention: Serial 7 subtraction starting at 100 (0/3) 3  Language: Repeat phrase (0/2) 2  Language : Fluency (0/1) 1  Abstraction (0/2) 2  Delayed Recall (0/5) 3  Orientation (0/6) 6  Total 27  Adjusted Score (based on education) 27   6CIT Screen 08/05/2021  What Year? 0 points  What month? 0 points  What time? 0 points  Count back from 20 0 points  Months in reverse 0 points  Repeat phrase 0 points  Total Score 0    Immunizations Immunization History  Administered Date(s) Administered   Fluad Quad(high Dose 65+) 08/11/2019, 08/03/2020   Influenza Split 09/11/2011   Influenza Whole 09/09/2007, 08/08/2008, 08/15/2010, 08/26/2012   Influenza,inj,Quad PF,6+ Mos 08/18/2013, 08/20/2014,  08/25/2018   Influenza-Unspecified 09/27/2015, 08/26/2017   Moderna Sars-Covid-2 Vaccination 01/01/2020, 01/20/2020, 02/16/2020, 07/25/2020, 11/22/2020   Pneumococcal Conjugate-13 02/12/2014   Pneumococcal Polysaccharide-23 11/26/2006, 10/21/2012, 11/22/2020   Td 01/01/2011, 06/30/2021   Zoster Recombinat (Shingrix) 02/22/2018, 07/08/2019   Zoster, Live 05/26/2007    TDAP status: Up to date  Flu Vaccine status: Due, Education has been provided regarding the importance of this vaccine. Advised may receive this vaccine at local pharmacy or Health Dept. Aware to provide a copy of the vaccination record if obtained from local pharmacy or Health Dept. Verbalized acceptance and understanding.  Pneumococcal vaccine status: Up to date  Covid-19 vaccine status: Completed vaccines  Qualifies for Shingles Vaccine? Yes   Zostavax completed Yes   Shingrix Completed?: Yes  Screening Tests Health Maintenance   Topic Date Due   Hepatitis C Screening  Never done   INFLUENZA VACCINE  06/26/2021   MAMMOGRAM  06/14/2022   COLONOSCOPY (Pts 45-54yr Insurance coverage will need to be confirmed)  10/10/2023   TETANUS/TDAP  07/01/2031   DEXA SCAN  Completed   COVID-19 Vaccine  Completed   PNA vac Low Risk Adult  Completed   Zoster Vaccines- Shingrix  Completed   HPV VACCINES  Aged Out    Health Maintenance  Health Maintenance Due  Topic Date Due   Hepatitis C Screening  Never done   INFLUENZA VACCINE  06/26/2021    Colorectal cancer screening: Type of screening: Colonoscopy. Completed 10/09/2018. Repeat every 5 years  Mammogram status: Completed 06/14/21. Repeat every year  Bone Density status: Completed 04/04/2020. Results reflect: Bone density results: OSTEOPOROSIS. Repeat every 2 years.  Lung Cancer Screening: (Low Dose CT Chest recommended if Age 78-80years, 30 pack-year currently smoking OR have quit w/in 15years.) does not qualify.   Lung Cancer Screening Referral: n/a  Additional Screening:  Hepatitis C Screening: does qualify;Need to address during next office visit  Vision Screening: Recommended annual ophthalmology exams for early detection of glaucoma and other disorders of the eye. Is the patient up to date with their annual eye exam?  No  Has upcoming appointment December 2022 Who is the provider or what is the name of the office in which the patient attends annual eye exams? Dr JLuretha Rued Dental Screening: Recommended annual dental exams for proper oral hygiene  Community Resource Referral / Chronic Care Management: CRR required this visit?  No   CCM required this visit?  No      Plan:     I have personally reviewed and noted the following in the patient's chart:   Medical and social history Use of alcohol, tobacco or illicit drugs  Current medications and supplements including opioid prescriptions.  Functional ability and status Nutritional status Physical  activity Advanced directives List of other physicians Hospitalizations, surgeries, and ER visits in previous 12 months Vitals Screenings to include cognitive, depression, and falls Referrals and appointments  In addition, I have reviewed and discussed with patient certain preventive protocols, quality metrics, and best practice recommendations. A written personalized care plan for preventive services as well as general preventive health recommendations were provided to patient.     AKris Mouton CMA   08/05/2021   Nurse Notes: Non-Face to Face 30 minutes visit.   Ms. SSpieker, Thank you for taking time to come for your Medicare Wellness Visit. I appreciate your ongoing commitment to your health goals. Please review the following plan we discussed and let me know if I can assist you in  the future.    This is a list of the screening recommended for you and due dates:  Health Maintenance  Topic Date Due   Hepatitis C Screening: USPSTF Recommendation to screen - Ages 26-79 yo.  Never done   Flu Shot  06/26/2021   Mammogram  06/14/2022   Colon Cancer Screening  10/10/2023   Tetanus Vaccine  07/01/2031   DEXA scan (bone density measurement)  Completed   COVID-19 Vaccine  Completed   Pneumonia vaccines  Completed   Zoster (Shingles) Vaccine  Completed   HPV Vaccine  Aged Out

## 2021-08-05 NOTE — Patient Instructions (Signed)
Health Maintenance, Female Adopting a healthy lifestyle and getting preventive care are important in promoting health and wellness. Ask your health care provider about: The right schedule for you to have regular tests and exams. Things you can do on your own to prevent diseases and keep yourself healthy. What should I know about diet, weight, and exercise? Eat a healthy diet  Eat a diet that includes plenty of vegetables, fruits, low-fat dairy products, and lean protein. Do not eat a lot of foods that are high in solid fats, added sugars, or sodium. Maintain a healthy weight Body mass index (BMI) is used to identify weight problems. It estimates body fat based on height and weight. Your health care provider can help determine your BMI and help you achieve or maintain a healthy weight. Get regular exercise Get regular exercise. This is one of the most important things you can do for your health. Most adults should: Exercise for at least 150 minutes each week. The exercise should increase your heart rate and make you sweat (moderate-intensity exercise). Do strengthening exercises at least twice a week. This is in addition to the moderate-intensity exercise. Spend less time sitting. Even light physical activity can be beneficial. Watch cholesterol and blood lipids Have your blood tested for lipids and cholesterol at 78 years of age, then have this test every 5 years. Have your cholesterol levels checked more often if: Your lipid or cholesterol levels are high. You are older than 78 years of age. You are at high risk for heart disease. What should I know about cancer screening? Depending on your health history and family history, you may need to have cancer screening at various ages. This may include screening for: Breast cancer. Cervical cancer. Colorectal cancer. Skin cancer. Lung cancer. What should I know about heart disease, diabetes, and high blood pressure? Blood pressure and heart  disease High blood pressure causes heart disease and increases the risk of stroke. This is more likely to develop in people who have high blood pressure readings, are of African descent, or are overweight. Have your blood pressure checked: Every 3-5 years if you are 18-39 years of age. Every year if you are 40 years old or older. Diabetes Have regular diabetes screenings. This checks your fasting blood sugar level. Have the screening done: Once every three years after age 40 if you are at a normal weight and have a low risk for diabetes. More often and at a younger age if you are overweight or have a high risk for diabetes. What should I know about preventing infection? Hepatitis B If you have a higher risk for hepatitis B, you should be screened for this virus. Talk with your health care provider to find out if you are at risk for hepatitis B infection. Hepatitis C Testing is recommended for: Everyone born from 1945 through 1965. Anyone with known risk factors for hepatitis C. Sexually transmitted infections (STIs) Get screened for STIs, including gonorrhea and chlamydia, if: You are sexually active and are younger than 78 years of age. You are older than 78 years of age and your health care provider tells you that you are at risk for this type of infection. Your sexual activity has changed since you were last screened, and you are at increased risk for chlamydia or gonorrhea. Ask your health care provider if you are at risk. Ask your health care provider about whether you are at high risk for HIV. Your health care provider may recommend a prescription medicine   to help prevent HIV infection. If you choose to take medicine to prevent HIV, you should first get tested for HIV. You should then be tested every 3 months for as long as you are taking the medicine. Pregnancy If you are about to stop having your period (premenopausal) and you may become pregnant, seek counseling before you get  pregnant. Take 400 to 800 micrograms (mcg) of folic acid every day if you become pregnant. Ask for birth control (contraception) if you want to prevent pregnancy. Osteoporosis and menopause Osteoporosis is a disease in which the bones lose minerals and strength with aging. This can result in bone fractures. If you are 65 years old or older, or if you are at risk for osteoporosis and fractures, ask your health care provider if you should: Be screened for bone loss. Take a calcium or vitamin D supplement to lower your risk of fractures. Be given hormone replacement therapy (HRT) to treat symptoms of menopause. Follow these instructions at home: Lifestyle Do not use any products that contain nicotine or tobacco, such as cigarettes, e-cigarettes, and chewing tobacco. If you need help quitting, ask your health care provider. Do not use street drugs. Do not share needles. Ask your health care provider for help if you need support or information about quitting drugs. Alcohol use Do not drink alcohol if: Your health care provider tells you not to drink. You are pregnant, may be pregnant, or are planning to become pregnant. If you drink alcohol: Limit how much you use to 0-1 drink a day. Limit intake if you are breastfeeding. Be aware of how much alcohol is in your drink. In the [place] one drink equals one 12 oz bottle of beer (355 mL), one 5 oz glass of wine (148 mL), or one 1 oz glass of hard liquor (44 mL). General instructions Schedule regular health, dental, and eye exams. Stay current with your vaccines. Tell your health care provider if: You often feel depressed. You have ever been abused or do not feel safe at home. Summary Adopting a healthy lifestyle and getting preventive care are important in promoting health and wellness. Follow your health care provider's instructions about healthy diet, exercising, and getting tested or screened for diseases. Follow your health care provider's  instructions on monitoring your cholesterol and blood pressure. This information is not intended to replace advice given to you by your health care provider. Make sure you discuss any questions you have with your health care provider. Document Revised: 01/20/2021 Document Reviewed: 11/05/2018 Elsevier Patient Education  2022 [employer]vier Inc.  

## 2021-08-06 NOTE — Progress Notes (Signed)
I reviewed health advisor's note, was available for consultation, and agree with documentation and plan.  

## 2021-08-08 ENCOUNTER — Encounter: Payer: Self-pay | Admitting: Family Medicine

## 2021-08-08 ENCOUNTER — Other Ambulatory Visit: Payer: Self-pay

## 2021-08-08 ENCOUNTER — Ambulatory Visit (INDEPENDENT_AMBULATORY_CARE_PROVIDER_SITE_OTHER): Payer: Medicare PPO | Admitting: Family Medicine

## 2021-08-08 VITALS — BP 138/84 | HR 70 | Temp 98.0°F | Ht 61.5 in | Wt 126.4 lb

## 2021-08-08 DIAGNOSIS — M792 Neuralgia and neuritis, unspecified: Secondary | ICD-10-CM

## 2021-08-08 DIAGNOSIS — I951 Orthostatic hypotension: Secondary | ICD-10-CM

## 2021-08-08 DIAGNOSIS — E559 Vitamin D deficiency, unspecified: Secondary | ICD-10-CM

## 2021-08-08 DIAGNOSIS — E538 Deficiency of other specified B group vitamins: Secondary | ICD-10-CM | POA: Diagnosis not present

## 2021-08-08 DIAGNOSIS — S42032A Displaced fracture of lateral end of left clavicle, initial encounter for closed fracture: Secondary | ICD-10-CM

## 2021-08-08 DIAGNOSIS — J449 Chronic obstructive pulmonary disease, unspecified: Secondary | ICD-10-CM

## 2021-08-08 DIAGNOSIS — G20 Parkinson's disease: Secondary | ICD-10-CM

## 2021-08-08 DIAGNOSIS — G8929 Other chronic pain: Secondary | ICD-10-CM

## 2021-08-08 DIAGNOSIS — G4752 REM sleep behavior disorder: Secondary | ICD-10-CM

## 2021-08-08 DIAGNOSIS — Z23 Encounter for immunization: Secondary | ICD-10-CM

## 2021-08-08 DIAGNOSIS — Z0000 Encounter for general adult medical examination without abnormal findings: Secondary | ICD-10-CM

## 2021-08-08 DIAGNOSIS — Z853 Personal history of malignant neoplasm of breast: Secondary | ICD-10-CM | POA: Diagnosis not present

## 2021-08-08 DIAGNOSIS — I712 Thoracic aortic aneurysm, without rupture, unspecified: Secondary | ICD-10-CM

## 2021-08-08 DIAGNOSIS — I700 Atherosclerosis of aorta: Secondary | ICD-10-CM

## 2021-08-08 DIAGNOSIS — A310 Pulmonary mycobacterial infection: Secondary | ICD-10-CM

## 2021-08-08 DIAGNOSIS — M810 Age-related osteoporosis without current pathological fracture: Secondary | ICD-10-CM | POA: Diagnosis not present

## 2021-08-08 DIAGNOSIS — I341 Nonrheumatic mitral (valve) prolapse: Secondary | ICD-10-CM

## 2021-08-08 DIAGNOSIS — I10 Essential (primary) hypertension: Secondary | ICD-10-CM | POA: Diagnosis not present

## 2021-08-08 DIAGNOSIS — R0609 Other forms of dyspnea: Secondary | ICD-10-CM | POA: Diagnosis not present

## 2021-08-08 DIAGNOSIS — Q254 Congenital malformation of aorta unspecified: Secondary | ICD-10-CM

## 2021-08-08 DIAGNOSIS — Z66 Do not resuscitate: Secondary | ICD-10-CM

## 2021-08-08 LAB — CBC WITH DIFFERENTIAL/PLATELET
Basophils Absolute: 0 10*3/uL (ref 0.0–0.1)
Basophils Relative: 0.9 % (ref 0.0–3.0)
Eosinophils Absolute: 0.2 10*3/uL (ref 0.0–0.7)
Eosinophils Relative: 3.1 % (ref 0.0–5.0)
HCT: 37.8 % (ref 36.0–46.0)
Hemoglobin: 12.4 g/dL (ref 12.0–15.0)
Lymphocytes Relative: 19.6 % (ref 12.0–46.0)
Lymphs Abs: 1 10*3/uL (ref 0.7–4.0)
MCHC: 32.7 g/dL (ref 30.0–36.0)
MCV: 94.1 fl (ref 78.0–100.0)
Monocytes Absolute: 0.5 10*3/uL (ref 0.1–1.0)
Monocytes Relative: 10.1 % (ref 3.0–12.0)
Neutro Abs: 3.5 10*3/uL (ref 1.4–7.7)
Neutrophils Relative %: 66.3 % (ref 43.0–77.0)
Platelets: 146 10*3/uL — ABNORMAL LOW (ref 150.0–400.0)
RBC: 4.02 Mil/uL (ref 3.87–5.11)
RDW: 15.3 % (ref 11.5–15.5)
WBC: 5.4 10*3/uL (ref 4.0–10.5)

## 2021-08-08 LAB — LIPID PANEL
Cholesterol: 171 mg/dL (ref 0–200)
HDL: 53.3 mg/dL (ref >39.00)
LDL Cholesterol: 97 mg/dL (ref 0–99)
NonHDL: 117.49
Total CHOL/HDL Ratio: 3
Triglycerides: 100 mg/dL (ref 0.0–149.0)
VLDL: 20 mg/dL (ref 0.0–40.0)

## 2021-08-08 LAB — HEPATIC FUNCTION PANEL
ALT: 2 U/L (ref 0–35)
AST: 8 U/L (ref 0–37)
Albumin: 3.9 g/dL (ref 3.5–5.2)
Alkaline Phosphatase: 67 U/L (ref 39–117)
Bilirubin, Direct: 0.2 mg/dL (ref 0.0–0.3)
Total Bilirubin: 1.4 mg/dL — ABNORMAL HIGH (ref 0.2–1.2)
Total Protein: 6.8 g/dL (ref 6.0–8.3)

## 2021-08-08 LAB — VITAMIN B12: Vitamin B-12: 384 pg/mL (ref 211–911)

## 2021-08-08 LAB — VITAMIN D 25 HYDROXY (VIT D DEFICIENCY, FRACTURES): VITD: 29.58 ng/mL — ABNORMAL LOW (ref 30.00–100.00)

## 2021-08-08 NOTE — Assessment & Plan Note (Signed)
Chronic, stable on low dose amlodipine 2.'5mg'$ . caution with orthostatic hypotension in h/o parkinson's disease.

## 2021-08-08 NOTE — Progress Notes (Addendum)
Patient ID: Crystal Bass, female    DOB: [birth year], 80 y.o.   MRN: [id number]  This visit was conducted in person.  BP 138/84   Pulse 70   Temp 98 F (36.7 C) (Temporal)   Ht 5' 1.5" (1.562 m)   Wt 126 lb 7 oz (57.4 kg)   SpO2 95%   BMI 23.50 kg/m    CC: CPE Subjective:   HPI: Crystal Bass is a 80 y.o. female presenting on 08/08/2021 for Medicare Wellness   Saw health advisor last week for medicare wellness visit. Note reviewed.   Hearing Screening   '500Hz'$  '1000Hz'$  '2000Hz'$  '4000Hz'$   Right ear 40 40 40 20  Left ear 40 40 20 40   Vision Screening   Right eye Left eye Both eyes  Without correction     With correction '20/40 20/50 20/50 '$  Notes cloudy/blurry vision - upcoming eye exam 10/2021  [place] [facility] Visit from 08/08/2021 in [place] at [place]  PHQ-2 Total Score 0       Fall Risk  08/08/2021 08/05/2021 03/24/2021 09/15/2020 04/01/2020  Falls in the past year? '1 1 1 '$ 0 0  Comment - - - - -  Number falls in past yr: 1 0 1 0 0  Injury with Fall? '1 1 1 '$ 0 0  Comment Lt clavicle fx - - - -  Risk Factor Category  - - - - -  Risk for fall due to : - History of fall(s) - - -  Follow up - - - - -  Suffered fall last month s/p clavicle fracture, due to hypotension. Saw [facility] ([place]).   COVID-19 infection last month - treated with molnupiravir with benefit. Notes mental fogginess since COVID.   COPD/asthma, ?h/o [name] - monitoring for symptoms followed by pulm last seen 03/2020 - rec 81mof/u visit. Continues trelegy ellipta. Overdue for pulm f/u.    Parkinson disease - followed by Dr Tat, on clonazepam for REM sleep disorder.  Takes gabapentin for chronic neuropathic pain - with daytime somnolence.   GERD followed by Dr PHenrene PastorQ2 yrs on omeprazole '20mg'$  daily.   Known thoracic aortic aneurysm as well as MVP with moderate regurgitation. Last saw cardiology ([place] Anon 08/2019.    Preventative: COLONOSCOPY 09/2018 - 2 TA removed, no f/u needed  (Dr PHenrene Pastor  ESOPHAGOGASTRODUODENOSCOPY 09/2018 - GERD with esophagitis and stricture dilated, antral gastritis negative for H pylori ([name] Breast cancer screening - h/o breast cancer s/p L mastectomy 2002. 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 Last seen 05/2021. Saw Dr TBerline Lopesgyn onc.  Lung cancer screening - not eligible DEXA scan - known osteoporosis previously on zometa ([facility]  DEXA 03/2020 - T score spine -2.5, hip -2.6 (worsened). Prolia started 2021, latest 07/25/2021.  Flu shot yearly Moderna covid vaccine 01/20/2020, booster 06/2020, 10/2020 Td 2012 Pneumovax-23 2013, 10/2020, [id number] zostavax 2008 shingrix - 01/2018 ([facility]), 06/2019 Advanced directive form scanned 01/2018 - HCPOA is [place]on [place] Does not want life prolonging measures if terminally ill. DNR (confirmed 01/2020). MOST form filled out 01/2020.  Seat belt use discussed Sunscreen use discussed. No changing moles on skin. Has seen derm.  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 Interim medical history since our last visit reviewed. Allergies and medications reviewed and updated. Outpatient Medications Prior to Visit  Medication Sig Dispense Refill  . acetaminophen (TYLENOL) 500 MG tablet Take 2 tablets (1,000 mg total) by mouth 2 (two) times daily as needed. (Patient taking differently: Take 1,000 mg by mouth 2 (two) times daily as needed for mild  pain or moderate pain.)    . albuterol (VENTOLIN HFA) 108 (90 Base) MCG/ACT inhaler Inhale 2 puffs into the lungs every 6 (six) hours as needed for wheezing or shortness of breath. 18 g 1  . amLODipine (NORVASC) 2.5 MG tablet TAKE 1 TABLET BY MOUTH ONCE A DAY 30 tablet 0  . aspirin 81 MG tablet Take 1 tablet (81 mg total) by mouth daily. 30 tablet   . Carbidopa-Levodopa ER (SINEMET CR) 25-100 MG tablet controlled release 2 at 9am, 1 at 1pm, 1 at 5pm 360 tablet 1  . clonazePAM (KLONOPIN) 0.5 MG tablet TAKE 1/2 A TABLET BY MOUTH AT BEDTIME 45 tablet 1  . EPINEPHrine 0.3 mg/0.3 mL IJ SOAJ injection     . gabapentin ([name]) 300 MG capsule TAKE 2 CAPSULES BY MOUTH 3 TIMES DAILY 540 capsule 1  . omeprazole (PRILOSEC) 20 MG capsule Take 1 capsule (20 mg total) by mouth daily. 30 capsule 11  . TRELEGY ELLIPTA 100-62.5-25 MCG/INH AEPB INHALE 1 PUFF BY MOUTH ONCE A DAY 60 each 5   No facility-administered medications prior to visit.     Per HPI unless specifically indicated in ROS section below Review of Systems  Constitutional:  Negative for activity change, appetite change, chills, fatigue, fever and unexpected weight change.  HENT:  Negative for hearing loss.   Eyes:  Negative for visual disturbance.  Respiratory:  Positive for cough and shortness of breath. Negative for chest tightness and wheezing.        Can wake up with loud snore. Daytime somnolence.   Cardiovascular:  Positive for palpitations and leg swelling. Negative for chest pain.  Gastrointestinal:  Negative for abdominal distention, abdominal pain, blood in stool, constipation, diarrhea, nausea and vomiting.  Genitourinary:  Negative for difficulty urinating and hematuria.  Musculoskeletal:  Negative for arthralgias, myalgias and neck pain.  Skin:  Negative for rash.  Neurological:  Positive for dizziness and syncope (orthostatic, 2 wks ago). Negative for seizures and headaches.  Hematological:  Negative for adenopathy.  Bruises/bleeds easily.  Psychiatric/Behavioral:  Negative for dysphoric mood. The patient is not nervous/anxious.    Objective:  BP 138/84   Pulse 70   Temp 98 F (36.7 C) (Temporal)   Ht 5' 1.5" (1.562 m)   Wt 126 lb 7 oz (57.4 kg)   SpO2 95%   BMI 23.50 kg/m   Wt Readings from Last 3 Encounters:  08/08/21 126 lb 7 oz (57.4 kg)  06/30/21 130 lb 9 oz (59.2 kg)  06/14/21 129 lb 12.8 oz (58.9 kg)      Physical Exam Vitals and nursing note reviewed.  Constitutional:      Appearance: Normal appearance. She is not ill-appearing.  HENT:     Head: Normocephalic and atraumatic.     Right Ear: Tympanic membrane, ear canal and  external ear normal. There is no impacted cerumen.     Left Ear: Tympanic membrane, ear canal and external ear normal. There is no impacted cerumen.  Eyes:     General:        Right eye: No discharge.        Left eye: No discharge.     Extraocular Movements: Extraocular movements intact.     Conjunctiva/sclera: Conjunctivae normal.     Pupils: Pupils are equal, round, and reactive to light.  Neck:     Thyroid: No thyroid mass or thyromegaly.     Vascular: No carotid bruit.  Cardiovascular:     Rate and Rhythm: Normal rate and regular rhythm.     Pulses: Normal pulses.     Heart sounds: Murmur (3/6 systolic) heard.  Pulmonary:     Effort: Pulmonary effort is normal. No respiratory distress.     Breath sounds: Normal breath sounds. No wheezing, rhonchi or rales.  Abdominal:     General: Bowel sounds are normal. There is no distension.     Palpations: Abdomen is soft. There is no mass.     Tenderness: There is no abdominal tenderness. There is no guarding or rebound.     Hernia: No hernia is present.  Musculoskeletal:     Cervical back: Normal range of motion and neck supple. No rigidity.     Right lower leg: No edema.     Left lower leg: No edema.     Comments: RUE sling. No pain to palpation along distal left clavicle  Lymphadenopathy:     Cervical:  No cervical adenopathy.  Skin:    General: Skin is warm and dry.     Findings: No rash.  Neurological:     General: No focal deficit present.     Mental Status: She is alert. Mental status is at baseline.     Comments:  Recall 3/3 Calculation 5/5 DLROW  Psychiatric:        Mood and Affect: Mood normal.        Behavior: Behavior normal.      Results for orders placed or performed in visit on 08/08/21  Lipid panel  Result Value Ref Range   Cholesterol 171 0 - 200 mg/dL   Triglycerides 100.0 0.0 - 149.0 mg/dL   HDL 53.30 >39.00 mg/dL   VLDL 20.0 0.0 - 40.0 mg/dL   LDL Cholesterol 97 0 - 99 mg/dL   Total CHOL/HDL Ratio 3    NonHDL 117.49   Hepatic function panel  Result Value Ref Range   Total Bilirubin 1.4 (H) 0.2 - 1.2 mg/dL   Bilirubin, Direct 0.2 0.0 - 0.3 mg/dL   Alkaline Phosphatase 67 39 - 117 U/L   AST 8 0 - 37 U/L   ALT 2 0 - 35 U/L   Total Protein 6.8 6.0 - 8.3 g/dL   Albumin 3.9 3.5 - 5.2 g/dL  Vitamin B12  Result Value Ref Range   Vitamin B-12 384 211 - 911 pg/mL  VITAMIN D 25 Hydroxy (Vit-D Deficiency, Fractures)  Result Value Ref Range   VITD 29.58 (L) 30.00 - 100.00 ng/mL  CBC with Differential/Platelet  Result Value Ref Range   WBC 5.4 4.0 - 10.5 K/uL   RBC 4.02 3.87 - 5.11 Mil/uL   Hemoglobin 12.4 12.0 - 15.0 g/dL   HCT 37.8 36.0 - 46.0 %   MCV 94.1 78.0 - 100.0 fl   MCHC 32.7 30.0 - 36.0 g/dL   RDW 15.3 11.5 -  15.5 %   Platelets 146.0 (L) 150.0 - 400.0 K/uL   Neutrophils Relative % 66.3 43.0 - 77.0 %   Lymphocytes Relative 19.6 12.0 - 46.0 %   Monocytes Relative 10.1 3.0 - 12.0 %   Eosinophils Relative 3.1 0.0 - 5.0 %   Basophils Relative 0.9 0.0 - 3.0 %   Neutro Abs 3.5 1.4 - 7.7 K/uL   Lymphs Abs 1.0 0.7 - 4.0 K/uL   Monocytes Absolute 0.5 0.1 - 1.0 K/uL   Eosinophils Absolute 0.2 0.0 - 0.7 K/uL   Basophils Absolute 0.0 0.0 - 0.1 K/uL    Assessment & Plan:  This visit occurred during the SARS-CoV-2 public health emergency.  Safety  protocols were in place, including screening questions prior to the visit, additional usage of staff PPE, and extensive cleaning of exam room while observing appropriate contact time as indicated for disinfecting solutions.   Problem List Items Addressed This Visit     Health maintenance examination - Primary (Chronic)    Preventative protocols reviewed and updated unless pt declined. Discussed healthy diet and lifestyle.       Essential hypertension    Chronic, stable on low dose amlodipine 2.'5mg'$ . caution with orthostatic hypotension in h/o parkinson's disease.       Relevant Orders   Lipid panel (Completed)   Hepatic function panel (Completed)   Chronic obstructive airway disease with asthma ([place])    Continues trelegy. Overdue for pulm f/u (last seen 03/2020). # provided to call and schedule appointment.       History of left breast cancer    S/p L mastectomy, continues yearly unilateral R mammograms      Thoracic aortic aneurysm without rupture ([place])    Due for rpt monitoring - will offer to patient.       Chronic dyspnea    Endorses ongoing daytime somnolence and fatigue along with known chronic dyspnea.  ESS today = 8. Encouraged pulm f/u.       Osteoporosis, post-menopausal    Continue prolia Q72mo last injection 07/25/2021, first started 2021.  # provided to call and schedule rpt DEXA scan.       Relevant Orders   DG Bone Density   VITAMIN D 25 Hydroxy (Vit-D Deficiency, Fractures) (Completed)   CBC with Differential/Platelet (Completed)   Vitamin D deficiency    Will need to verify she's taking vit D replacement.       Relevant Orders   VITAMIN D 25 Hydroxy (Vit-D Deficiency, Fractures) (Completed)   DNR (do not resuscitate)   Parkinson disease ([place]    [facility] care.  Continue sinemet.       Vitamin B12 deficiency    Continue b12 replacement. Last b12 shot was 04/2021. May need to start oral replacement if not getting monthly shots.        Relevant Orders   Vitamin B12 (Completed)   Orthostatic hypotension    Now off northera.  She had an episode of orthostatic syncope several weeks ago.  Encouraged good hydration status.       [name] (mycobacterium avium-intracellulare) infection ([place]    Overdue for pulm f/u - advised call and schedule this.       Orthostatic syncope   REM sleep behavior disorder    Continues nightly clonazepam.       Chronic neuropathic pain    Continues gabapentin '600mg'$  tid      Mitral valve prolapse    Consider updated echo vs return to cards (last seen  08/2019).       Atherosclerosis of aorta (HCC)    With h/o thoracic aortic dilatation. Not current on statin - will review next visit.       Aortic arch anomaly    Stable aortic arch dilation at 3.5cm. Will offer updated CTA.       Traumatic closed fracture of distal clavicle with minimal displacement, left, initial encounter    Appreciate [facility] care.  She is now in a sling Discussed slow transition as per ortho recs.       Other Visit Diagnoses     Need for immunization against influenza       Relevant Orders   Flu Vaccine QUAD High Dose(Fluad) (Completed)        No orders of the defined types were placed in this encounter.  Orders Placed This Encounter  Procedures  . DG Bone Density    Standing Status:   Future    Standing Expiration Date:   08/08/2022    Order Specific Question:   Reason for Exam (SYMPTOM  OR DIAGNOSIS REQUIRED)    Answer:   osteoporosis f/u    Order Specific Question:   Preferred imaging location?    Answer:   [facility]  . Flu Vaccine QUAD High Dose(Fluad)  . Lipid panel  . Hepatic function panel  . Vitamin B12  . VITAMIN D 25 Hydroxy (Vit-D Deficiency, Fractures)  . CBC with Differential/Platelet    Patient instructions: Labs today  Call lung doctors to schedule follow up visit. [phone] Call to schedule bone density scan at your convenience: [place]  [phone]. I have placed an order for this.  Good to see you today. Return in 6 months for follow up visit.   Follow up plan: Return in about 6 months (around 02/05/2022) for follow up visit.  [name], MD

## 2021-08-08 NOTE — Patient Instructions (Addendum)
Labs today  Call lung doctors to schedule follow up visit. [phone] Call to schedule bone density scan at your convenience: [place] [phone]. I have placed an order for this.  Good to see you today. Return in 6 months for follow up visit.   Health Maintenance After Age 78 After age 78, you are at a higher risk for certain long-term diseases and infections as well as injuries from falls. Falls are a major cause of broken bones and head injuries in people who are older than age 63. Getting regular preventive care can help to keep you healthy and well. Preventive care includes getting regular testing and making lifestyle changes as recommended by your health care provider. Talk with your health care provider about: Which screenings and tests you should have. A screening is a test that checks for a disease when you have no symptoms. A diet and exercise plan that is right for you. What should I know about screenings and tests to prevent falls? Screening and testing are the best ways to find a health problem early. Early diagnosis and treatment give you the best chance of managing medical conditions that are common after age 9. Certain conditions and lifestyle choices may make you more likely to have a fall. Your health care provider may recommend: Regular vision checks. Poor vision and conditions such as cataracts can make you more likely to have a fall. If you wear glasses, make sure to get your prescription updated if your vision changes. Medicine review. Work with your health care provider to regularly review all of the medicines you are taking, including over-the-counter medicines. Ask your health care provider about any side effects that may make you more likely to have a fall. Tell your health care provider if any medicines that you take make you feel dizzy or sleepy. Osteoporosis screening. Osteoporosis is a condition that causes the bones to get weaker. This can make  the bones weak and cause them to break more easily. Blood pressure screening. Blood pressure changes and medicines to control blood pressure can make you feel dizzy. Strength and balance checks. Your health care provider may recommend certain tests to check your strength and balance while standing, walking, or changing positions. Foot health exam. Foot pain and numbness, as well as not wearing proper footwear, can make you more likely to have a fall. Depression screening. You may be more likely to have a fall if you have a fear of falling, feel emotionally low, or feel unable to do activities that you used to do. Alcohol use screening. Using too much alcohol can affect your balance and may make you more likely to have a fall. What actions can I take to lower my risk of falls? General instructions Talk with your health care provider about your risks for falling. Tell your health care provider if: You fall. Be sure to tell your health care provider about all falls, even ones that seem minor. You feel dizzy, sleepy, or off-balance. Take over-the-counter and prescription medicines only as told by your health care provider. These include any supplements. Eat a healthy diet and maintain a healthy weight. A healthy diet includes low-fat dairy products, low-fat (lean) meats, and fiber from whole grains, beans, and lots of fruits and vegetables. Home safety Remove any tripping hazards, such as rugs, cords, and clutter. Install safety equipment such as grab bars in bathrooms and safety rails on stairs. Keep rooms and walkways well-lit. Activity  Follow a regular  exercise program to stay fit. This will help you maintain your balance. Ask your health care provider what types of exercise are appropriate for you. If you need a cane or walker, use it as recommended by your health care provider. Wear supportive shoes that have nonskid soles. Lifestyle Do not drink alcohol if your health care provider tells  you not to drink. If you drink alcohol, limit how much you have: 0-1 drink a day for women. 0-2 drinks a day for men. Be aware of how much alcohol is in your drink. In the [place] one drink equals one typical bottle of beer (12 oz), one-half glass of wine (5 oz), or one shot of hard liquor (1 oz). Do not use any products that contain nicotine or tobacco, such as cigarettes and e-cigarettes. If you need help quitting, ask your health care provider. Summary Having a healthy lifestyle and getting preventive care can help to protect your health and wellness after age 8. Screening and testing are the best way to find a health problem early and help you avoid having a fall. Early diagnosis and treatment give you the best chance for managing medical conditions that are more common for people who are older than age 5. Falls are a major cause of broken bones and head injuries in people who are older than age 29. Take precautions to prevent a fall at home. Work with your health care provider to learn what changes you can make to improve your health and wellness and to prevent falls. This information is not intended to replace advice given to you by your health care provider. Make sure you discuss any questions you have with your health care provider. Document Revised: 01/20/2021 Document Reviewed: 10/28/2020 Elsevier Patient Education  2022 [employer].

## 2021-08-08 NOTE — Assessment & Plan Note (Signed)
Continues trelegy. Overdue for pulm f/u (last seen 03/2020). # provided to call and schedule appointment.

## 2021-08-08 NOTE — Assessment & Plan Note (Signed)
Preventative protocols reviewed and updated unless pt declined. Discussed healthy diet and lifestyle.  

## 2021-08-09 ENCOUNTER — Telehealth: Payer: Self-pay | Admitting: Family Medicine

## 2021-08-09 DIAGNOSIS — I341 Nonrheumatic mitral (valve) prolapse: Secondary | ICD-10-CM

## 2021-08-09 DIAGNOSIS — S42032A Displaced fracture of lateral end of left clavicle, initial encounter for closed fracture: Secondary | ICD-10-CM | POA: Insufficient documentation

## 2021-08-09 DIAGNOSIS — Q254 Congenital malformation of aorta unspecified: Secondary | ICD-10-CM

## 2021-08-09 DIAGNOSIS — I712 Thoracic aortic aneurysm, without rupture, unspecified: Secondary | ICD-10-CM

## 2021-08-09 NOTE — Assessment & Plan Note (Signed)
Overdue for pulm f/u - advised call and schedule this.

## 2021-08-09 NOTE — Assessment & Plan Note (Addendum)
Stable aortic arch dilation at 3.5cm. Will offer updated CTA.

## 2021-08-09 NOTE — Assessment & Plan Note (Signed)
Consider updated echo vs return to cards (last seen 08/2019).

## 2021-08-09 NOTE — Assessment & Plan Note (Addendum)
Continue prolia Q5mo last injection 07/25/2021, first started 2021.  # provided to call and schedule rpt DEXA scan.

## 2021-08-09 NOTE — Assessment & Plan Note (Signed)
Continues nightly clonazepam.

## 2021-08-09 NOTE — Assessment & Plan Note (Signed)
With h/o thoracic aortic dilatation. Not current on statin - will review next visit.

## 2021-08-09 NOTE — Telephone Encounter (Signed)
Lvm asking pt to call back.  Need to relay Dr. G's message.  

## 2021-08-09 NOTE — Assessment & Plan Note (Signed)
Appreciate neurology care.  Continue sinemet.

## 2021-08-09 NOTE — Assessment & Plan Note (Signed)
S/p L mastectomy, continues yearly unilateral R mammograms

## 2021-08-09 NOTE — Telephone Encounter (Signed)
Plz call - reviewing chart, she is due for repeat heart ultrasound as well as chest CT angio to follow her know leaky mitral valve and dilation of the aortic arch - if she agrees I will order these for her.

## 2021-08-09 NOTE — Assessment & Plan Note (Addendum)
Continue b12 replacement. Last b12 shot was 04/2021. May need to start oral replacement if not getting monthly shots.

## 2021-08-09 NOTE — Assessment & Plan Note (Signed)
Will need to verify she's taking vit D replacement.

## 2021-08-09 NOTE — Assessment & Plan Note (Addendum)
Now off northera.  She had an episode of orthostatic syncope several weeks ago.  Encouraged good hydration status.

## 2021-08-09 NOTE — Assessment & Plan Note (Signed)
Appreciate [facility] care.  She is now in a sling Discussed slow transition as per ortho recs.

## 2021-08-09 NOTE — Assessment & Plan Note (Signed)
Continues gabapentin '600mg'$  tid

## 2021-08-09 NOTE — Assessment & Plan Note (Signed)
Due for rpt monitoring - will offer to patient.

## 2021-08-09 NOTE — Assessment & Plan Note (Addendum)
Endorses ongoing daytime somnolence and fatigue along with known chronic dyspnea.  ESS today = 8. Encouraged pulm f/u.

## 2021-08-10 NOTE — Telephone Encounter (Signed)
[place] Night - Client Nonclinical Telephone Record [facility] Client [place] Night - Client Client Site [place] - Night Physician [facility] - PHYSICIAN, Verita Schneiders- MD Contact Type Call Who Is Calling Patient / Member / Family / Caregiver Caller Name [place] Phone Number [phone] Call Type Message Only Information Provided Reason for Call Returning a Call from the Office Initial [place] states, returning a call from office. Disp. Time Disposition Final User 08/09/2021 5:17:04 PM General Information Provided Yes Jobie Quaker Call Closed By: Jobie Quaker Transaction Date/Time: 08/09/2021 5:15:35 PM (ET)

## 2021-08-11 ENCOUNTER — Telehealth: Payer: Self-pay | Admitting: Family Medicine

## 2021-08-11 NOTE — Telephone Encounter (Signed)
Pt returning phone call ° °

## 2021-08-11 NOTE — Telephone Encounter (Signed)
See 08-09-21 Telephone Encounter.

## 2021-08-11 NOTE — Telephone Encounter (Signed)
Left message on VM that I will reach out to her tomorrow and Sunday. If I am not able to reach her, I asked that she call the office Monday.

## 2021-08-12 NOTE — Telephone Encounter (Signed)
Left detailed message on VM per DPR with Dr Synthia Innocent note. Asked that she call the office Monday or send a MyChart if she wants to move forward with the referrals.

## 2021-08-15 NOTE — Telephone Encounter (Signed)
Patient notified as instructed by telephone and verbalized understanding.  Patient stated that she is okay with the referral for the test. Patient stated that her insurance has been covering this and wants to make sure that they will this time.

## 2021-08-15 NOTE — Telephone Encounter (Signed)
Imaging studies ordered.

## 2021-08-15 NOTE — Addendum Note (Signed)
Addended by: [name] on: 08/15/2021 05:02 PM   Modules accepted: Orders

## 2021-08-21 DIAGNOSIS — S42002A Fracture of unspecified part of left clavicle, initial encounter for closed fracture: Secondary | ICD-10-CM | POA: Diagnosis not present

## 2021-08-24 DIAGNOSIS — J3089 Other allergic rhinitis: Secondary | ICD-10-CM | POA: Diagnosis not present

## 2021-08-24 DIAGNOSIS — J301 Allergic rhinitis due to pollen: Secondary | ICD-10-CM | POA: Diagnosis not present

## 2021-08-29 DIAGNOSIS — J301 Allergic rhinitis due to pollen: Secondary | ICD-10-CM | POA: Diagnosis not present

## 2021-08-29 DIAGNOSIS — J3089 Other allergic rhinitis: Secondary | ICD-10-CM | POA: Diagnosis not present

## 2021-09-01 DIAGNOSIS — J301 Allergic rhinitis due to pollen: Secondary | ICD-10-CM | POA: Diagnosis not present

## 2021-09-01 DIAGNOSIS — J3089 Other allergic rhinitis: Secondary | ICD-10-CM | POA: Diagnosis not present

## 2021-09-05 DIAGNOSIS — J3089 Other allergic rhinitis: Secondary | ICD-10-CM | POA: Diagnosis not present

## 2021-09-05 DIAGNOSIS — J301 Allergic rhinitis due to pollen: Secondary | ICD-10-CM | POA: Diagnosis not present

## 2021-09-11 ENCOUNTER — Other Ambulatory Visit: Payer: Self-pay

## 2021-09-11 ENCOUNTER — Ambulatory Visit
Admission: RE | Admit: 2021-09-11 | Discharge: 2021-09-11 | Disposition: A | Discharge status: Home / Self Care | Payer: Medicare PPO | Source: Ambulatory Visit | Attending: Family Medicine | Admitting: Family Medicine

## 2021-09-11 DIAGNOSIS — I77810 Thoracic aortic ectasia: Secondary | ICD-10-CM | POA: Diagnosis not present

## 2021-09-11 DIAGNOSIS — I712 Thoracic aortic aneurysm, without rupture, unspecified: Secondary | ICD-10-CM | POA: Insufficient documentation

## 2021-09-11 DIAGNOSIS — Q254 Congenital malformation of aorta unspecified: Secondary | ICD-10-CM | POA: Insufficient documentation

## 2021-09-11 LAB — POCT I-STAT CREATININE: Creatinine, Ser: 0.7 mg/dL (ref 0.44–1.00)

## 2021-09-11 MED ORDER — IOHEXOL 350 MG/ML SOLN
75.0000 mL | Freq: Once | INTRAVENOUS | Status: AC | PRN
  Administered 2021-09-11: 75 mL via INTRAVENOUS

## 2021-09-12 DIAGNOSIS — J301 Allergic rhinitis due to pollen: Secondary | ICD-10-CM | POA: Diagnosis not present

## 2021-09-12 DIAGNOSIS — J3089 Other allergic rhinitis: Secondary | ICD-10-CM | POA: Diagnosis not present

## 2021-09-19 ENCOUNTER — Ambulatory Visit (INDEPENDENT_AMBULATORY_CARE_PROVIDER_SITE_OTHER): Payer: Medicare PPO

## 2021-09-19 ENCOUNTER — Other Ambulatory Visit: Payer: Self-pay

## 2021-09-19 DIAGNOSIS — Q254 Congenital malformation of aorta unspecified: Secondary | ICD-10-CM | POA: Diagnosis not present

## 2021-09-19 DIAGNOSIS — I341 Nonrheumatic mitral (valve) prolapse: Secondary | ICD-10-CM

## 2021-09-19 LAB — ECHOCARDIOGRAM COMPLETE
AR max vel: 2.9 cm2
AV Area VTI: 3.23 cm2
AV Area mean vel: 3.08 cm2
AV Mean grad: 4 mmHg
AV Peak grad: 8 mmHg
Ao pk vel: 1.41 m/s
Area-P 1/2: 3.03 cm2
Calc EF: 60.9 %
S' Lateral: 2.9 cm
Single Plane A2C EF: 65.8 %
Single Plane A4C EF: 56.9 %

## 2021-09-20 ENCOUNTER — Encounter: Payer: Self-pay | Admitting: Family Medicine

## 2021-09-20 ENCOUNTER — Other Ambulatory Visit: Payer: Self-pay | Admitting: Family Medicine

## 2021-09-20 DIAGNOSIS — I340 Nonrheumatic mitral (valve) insufficiency: Secondary | ICD-10-CM

## 2021-09-22 DIAGNOSIS — J301 Allergic rhinitis due to pollen: Secondary | ICD-10-CM | POA: Diagnosis not present

## 2021-09-22 DIAGNOSIS — J3089 Other allergic rhinitis: Secondary | ICD-10-CM | POA: Diagnosis not present

## 2021-09-26 ENCOUNTER — Ambulatory Visit: Payer: Medicare PPO | Admitting: Neurology

## 2021-10-02 ENCOUNTER — Other Ambulatory Visit: Payer: Self-pay | Admitting: Family Medicine

## 2021-10-03 ENCOUNTER — Other Ambulatory Visit: Payer: Self-pay

## 2021-10-03 ENCOUNTER — Encounter: Payer: Self-pay | Admitting: Cardiovascular Disease

## 2021-10-03 ENCOUNTER — Ambulatory Visit: Payer: Medicare PPO | Admitting: Cardiovascular Disease

## 2021-10-03 VITALS — BP 118/60 | HR 72 | Ht 62.0 in | Wt 129.5 lb

## 2021-10-03 DIAGNOSIS — I341 Nonrheumatic mitral (valve) prolapse: Secondary | ICD-10-CM

## 2021-10-03 DIAGNOSIS — I10 Essential (primary) hypertension: Secondary | ICD-10-CM | POA: Diagnosis not present

## 2021-10-03 DIAGNOSIS — G903 Multi-system degeneration of the autonomic nervous system: Secondary | ICD-10-CM

## 2021-10-03 NOTE — Patient Instructions (Signed)
Medication Instructions:  Your physician recommends that you continue on your current medications as directed. Please refer to the Current Medication list given to you today.  *If you need a refill on your cardiac medications before your next appointment, please call your pharmacy*   Lab Work: None ordered If you have labs (blood work) drawn today and your tests are completely normal, you will receive your results only by: [place] (if you have MyChart) OR A paper copy in the mail If you have any lab test that is abnormal or we need to change your treatment, we will call you to review the results.   Testing/Procedures: None ordered   Follow-Up: At [facility], you and your health needs are our priority.  As part of our continuing mission to provide you with exceptional heart care, we have created designated Provider Care Teams.  These Care Teams include your primary Cardiologist (physician) and Advanced Practice Providers (APPs -  Physician Assistants and Nurse Practitioners) who all work together to provide you with the care you need, when you need it.  We recommend signing up for the patient portal called "MyChart".  Sign up information is provided on this After Visit Summary.  MyChart is used to connect with patients for Virtual Visits (Telemedicine).  Patients are able to view lab/test results, encounter notes, upcoming appointments, etc.  Non-urgent messages can be sent to your provider as well.   To learn more about what you can do with MyChart, go to [email or web address].    Your next appointment:   4 week(s)  The format for your next appointment:   In Person  Provider:   [name], MD{    Other Instructions  You have been given a letter today that states that you do not need an antibiotic to be given prior to dental work.

## 2021-10-03 NOTE — Progress Notes (Signed)
Cardiology Office Note   Date:  10/03/2021   ID:  Crystal Bass, Crystal Bass [birth year], MRN [id number]  PCP:  [name], MD  Cardiologist:   [name], MD   Chief Complaint  Patient presents with   Other    Mitral regurgitation c/o sob. Meds reviewed verbally with pt.      History of Present Illness: Crystal Bass is a 80 y.o. female who is here today for follow-up visit regarding  mitral regurgitation.   She has chronic medical conditions that include mitral valve prolapse with moderate regurgitation, exertional dyspnea and orthostatic hypotension in the setting of Parkinson's disease. She was diagnosed with Parkinson's in 2015 . She used to be on multiple blood pressure medications which were discontinued due to orthostatic hypotension.   The patient had prior cardiac catheterization in 2012 by Dr. [name] which showed normal coronary arteries and ejection fraction and normal renal arteries.   Previous CT of the lungs showed evidence of coronary artery calcifications. Echocardiogram in October 2019 showed normal LV systolic function, grade 1 diastolic dysfunction, anterior mitral valve leaflet prolapse with moderate to severe regurgitation.  Left atrium was moderately to severely dilated.     She was hospitalized in early December, 2019 with atypical chest pain.  Initial CTA of the chest showed single small pulmonary embolism in the apical segmental branch of the left upper lobe.  There was also evidence of bronchiectasis.  Repeat CT after 2 days showed resolution of the pulmonary embolus.  The patient was treated with Eliquis for 3 months.     She continued to have exertional dyspnea and there was concern about the degree of mitral regurgitation.  I proceeded with a transesophageal echocardiogram in February, 2020 which showed that her mitral regurgitation was moderate.  Ejection fraction was normal. She has not been seen by me in 2 years. She had an echocardiogram  done last month which showed normal LV systolic function, grade 2 diastolic dysfunction, small pericardial effusion and moderate to severe mitral regurgitation due to significant prolapse of the anterior mitral leaflet. She seems to be more short of breath than before walking about 30 yards.  In addition, she reports some orthopnea recently.  Orthostatic dizziness has improved compared to before.  She does have reported history of COPD although she never smoked.  She was most recently seen by pulmonary in 2020.  Pulmonary function testing showed minimal obstructive disease.   Past Medical History:  Diagnosis Date   Aneurysm of aorta (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   Aortic root aneurysm    Arthritis    Asthma    Breast cancer ([place]) 2002   infiltrative ductal carcinoma    Cataract 2019   COPD (chronic obstructive pulmonary disease) ([place]) 07/2007   Ductal carcinoma of breast, estrogen receptor positive, stage 1 ([place]) 09/16/2012   Endometriosis    GERD (gastroesophageal reflux disease)    Hypercalcemia 09/14/2013   Hypertension    Lesion of breast 1992   right, benign   Lung disease    secondary to [name] infection   Mastalgia 05/1994   Osteoporosis, post-menopausal 03/14/2012   Ovarian tumor of borderline malignancy, right 2004   Pancreatitis    secondary to Cholelithiasis   Parkinson disease ([place]) 02/10/2015   Post-thoracotomy pain syndrome    Pseudomonas pneumonia ([place]) 03/2008   Traumatic closed fracture of distal clavicle with minimal displacement, left, initial encounter 06/2021   [facility]  Uterine fibroid    Vitamin D deficiency     Past Surgical History:  Procedure Laterality Date   ABDOMINAL HYSTERECTOMY     & BSO for Mucinous borderline tumor of R ovary 2004   APPENDECTOMY  2004   BREAST BIOPSY  08/2004   right breast-benign   BREAST LUMPECTOMY  1992   benign   CARDIAC CATHETERIZATION  2007   essentially negation for significant CAD    CHOLECYSTECTOMY  2001   COLONOSCOPY  2014   Dr [name] , due 2019   COLONOSCOPY  09/2018   2 TA removed, int hem, no f/u needed (Dr [name])    ESOPHAGOGASTRODUODENOSCOPY  09/2018   GERD with esophagitis and stricture dilated, antral gastritis negative for H pylori [name])   LUNG BIOPSY  2002   [name], Dr [name]   MASTECTOMY MODIFIED RADICAL Left 2002   oral chemotheraphy (tamoxifen then Armidex) no radiation, Dr.[name]   TEE WITHOUT CARDIOVERSION N/A 12/29/2018   Procedure: TRANSESOPHAGEAL ECHOCARDIOGRAM (TEE);  Surgeon: Wellington Hampshire, MD;  Location: [facility] ORS;  Service: Cardiovascular;  Laterality: N/A;   TONSILLECTOMY  1946     Current Outpatient Medications  Medication Sig Dispense Refill   acetaminophen (TYLENOL) 500 MG tablet Take 2 tablets (1,000 mg total) by mouth 2 (two) times daily as needed. (Patient taking differently: Take 1,000 mg by mouth 2 (two) times daily as needed for mild pain or moderate pain.)     albuterol (VENTOLIN HFA) 108 (90 Base) MCG/ACT inhaler Inhale 2 puffs into the lungs every 6 (six) hours as needed for wheezing or shortness of breath. 18 g 1   amLODipine (NORVASC) 2.5 MG tablet TAKE 1 TABLET BY MOUTH ONCE A DAY 30 tablet 5   aspirin 81 MG tablet Take 1 tablet (81 mg total) by mouth daily. 30 tablet    Carbidopa-Levodopa ER (SINEMET CR) 25-100 MG tablet controlled release 2 at 9am, 1 at 1pm, 1 at 5pm 360 tablet 1   clonazePAM (KLONOPIN) 0.5 MG tablet TAKE 1/2 A TABLET BY MOUTH AT BEDTIME 45 tablet 1   EPINEPHrine 0.3 mg/0.3 mL IJ SOAJ injection      gabapentin ([name]) 300 MG capsule TAKE 2 CAPSULES BY MOUTH 3 TIMES DAILY 540 capsule 1   omeprazole (PRILOSEC) 20 MG capsule Take 1 capsule (20 mg total) by mouth daily. 30 capsule 11   TRELEGY ELLIPTA 100-62.5-25 MCG/INH AEPB INHALE 1 PUFF BY MOUTH ONCE A DAY 60 each 5   No current facility-administered medications for this visit.    Allergies:   Sulfonamide derivatives, Topamax [topiramate], Biaxin  [clarithromycin], Hydrocodone, Motrin [ibuprofen], Sulfa antibiotics, Symbicort [budesonide-formoterol fumarate], Tetracycline hcl, Percocet [oxycodone-acetaminophen], Tape, and Tetracyclines & related    Social History:  The patient  reports that she has never smoked. She has never used smokeless tobacco. She reports that she does not drink alcohol and does not use drugs.   Family History:  The patient's family history includes Breast cancer in her cousin; Breast cancer (age of onset: 59) in her sister; Cancer in her maternal grandfather, maternal grandmother, maternal uncle, maternal uncle, and maternal uncle; Colon cancer in her sister; Colon cancer (age of onset: 38) in her cousin; Emphysema in her mother; Liver cancer in her maternal aunt; Lung cancer (age of onset: 60) in her cousin; Lung cancer (age of onset: 33) in her father; Melanoma in her maternal aunt; Myasthenia gravis in her paternal aunt; Osteoporosis in her paternal aunt; Parkinson's disease in her cousin; Stroke in her paternal  aunt.    ROS:  Please see the history of present illness.   Otherwise, review of systems are positive for none.   All other systems are reviewed and negative.    PHYSICAL EXAM: VS:  BP 118/60 (BP Location: Left Arm, Patient Position: Sitting, Cuff Size: Normal)   Pulse 72   Ht 5\' 2"  (1.575 m)   Wt 129 lb 8 oz (58.7 kg)   SpO2 92%   BMI 23.69 kg/m  , BMI Body mass index is 23.69 kg/m. GEN: Well nourished, well developed, in no acute distress  HEENT: normal  Neck: no JVD, carotid bruits, or masses Cardiac: RRR; no rubs, or gallops,no edema .  2 out of 6 holosystolic murmur at the apex and the left sternal border. Respiratory:  clear to auscultation bilaterally, normal work of breathing GI: soft, nontender, nondistended, + BS MS: no deformity or atrophy  Skin: warm and dry, no rash Neuro:  Strength and sensation are intact Psych: euthymic mood, full affect   EKG:  EKG is ordered today. The ekg  ordered today demonstrates  normal sinus rhythm with LVH and repolarization abnormalities.   Recent Labs: 07/06/2021: BUN 15; Potassium 3.8; Sodium 138 08/08/2021: ALT 2; Hemoglobin 12.4; Platelets 146.0 09/11/2021: Creatinine, Ser 0.70    Lipid Panel    Component Value Date/Time   CHOL 171 08/08/2021 1320   TRIG 100.0 08/08/2021 1320   HDL 53.30 08/08/2021 1320   CHOLHDL 3 08/08/2021 1320   VLDL 20.0 08/08/2021 1320   LDLCALC 97 08/08/2021 1320   LDLDIRECT 135.0 08/16/2009 1359      Wt Readings from Last 3 Encounters:  10/03/21 129 lb 8 oz (58.7 kg)  08/08/21 126 lb 7 oz (57.4 kg)  06/30/21 130 lb 9 oz (59.2 kg)      No flowsheet data found.    ASSESSMENT AND PLAN:  1.   Mitral valve prolapse with moderate to severe mitral regurgitation, she seems to be having worsening symptoms of exertional dyspnea and orthopnea.  It is highly possible that this is due to progression of mitral regurgitation.  In addition, she does have underlying pulmonary disease but based on her pulmonary function testing in 2020, it does not appear to be severe enough to explain her current worsening of symptoms.  Repeat pulmonary evaluation might be helpful. I do feel that we have to further investigate the significance of her mitral regurgitation which I think would be best achieved with a right and left cardiac catheterization with plans for mitral valve repair if her mitral regurgitation is deemed to be severe.  A TEE would likely be pursued after cardiac catheterization. I had a prolonged discussion with the patient about this and obviously she is somewhat hesitant given her age and comorbidities.  She wants to discuss with other family members.  In addition, she wants to discuss with Dr. [name] before proceeding with any procedures.  2.  Orthostatic hypotension: Her symptoms seem to be improved compared to before.  3.  Essential hypertension: Blood pressure is controlled on small dose  amlodipine.  4.  Parkinson's disease: Neurologic symptoms seem to be stable.    Disposition:   FU with me in 1 months  Signed,  [name], MD  10/03/2021 2:41 PM    [place]

## 2021-10-06 DIAGNOSIS — J3089 Other allergic rhinitis: Secondary | ICD-10-CM | POA: Diagnosis not present

## 2021-10-06 DIAGNOSIS — J301 Allergic rhinitis due to pollen: Secondary | ICD-10-CM | POA: Diagnosis not present

## 2021-10-11 NOTE — Progress Notes (Signed)
Assessment/Plan:   1.  Parkinsons Disease  -Continue carbidopa/levodopa 25/100 CR, 2/1/1.  Changing from IR did not change hypersomnolence, but I did not change back because of orthostasis.  -discussed PT but decided to hold on that until she gets heart cath  -getting some diplopia and has appt with ophth to discuss 2.  EDS             -don't think that related to PD             -this is the biggest c/o.    Gabapentin likely is playing a role (although BP certainly an issue), but PCP attempting to lower 3.  Orthostatic hypotension, with history of associated syncope             -Refuses compression binder.             -Compliance is an issue here.  Previously prescribed Northera but did not take.  She and I talked about this, as it sounds like she had more syncopal episodes since last visit and even fractured the clavicle.  She is under the impression today that primary care did not want her to take Northera, but that was never my impression.  She is describing more dizziness and lightheadedness.  Told her that I may send a note to her primary care physician.  -Patient is on blood pressure medications, but does not want to change that given her worry about her thoracic aortic aneurysm.  She does not want to stop her amlodopine.  States that when she did this in the past, her blood pressure raised up too high.  Discussed the concept of permissive hypertension with her with Parkinson's disease. 4.  REM behavior disorder              -Continue clonazepam 0.5 mg, half tablet at bedtime. 5.  Mild cognitive impairment             -Last neurocognitive testing was in September, 2018.  I have not seen significant change since that time.  She is noting some worsening of memory and discussed repeating but she wants to hold for now 6.  SOB  -cardiology thinks due to severe MR and wants to do cardiac cath.  She asks me about my recommendations and I did recommend she proceed (O2 was low upon entering the  office today).  She does have a follow-up with cardiology next month.  Subjective:   Crystal Bass was seen today in follow up for Parkinsons disease.  My previous records were reviewed prior to todays visit as well as outside records available to me.  She recently saw cardiology on November 8.  It was felt that her progressive shortness of breath was likely due to progression of mitral regurgitation (severe).  Cardiac catheterization was recommended and likely TEE.  Patient wanted to discuss with family and primary care physician before proceeding.  She asks my opinion on that today.  States that she had 2 falls since last visit - both related to lightheadedness and both after a large, loose bowel movement.  She thinks that she fractured her clavicle after one of the episodes.  She feels "dizzy and out of control" with balance.    Current prescribed movement disorder medications: Carbidopa/levodopa 25/100 CR, 2 tablets in the morning, 1 in the afternoon and 1 in the evening Clonazepam 0.5 mg, half tablet at night   PREVIOUS MEDICATIONS: Sinemet (changed from IR due to EDS but not sure  it really made a difference but we did not change back because of [place]); northera (RX by cardiology but not taken)  ALLERGIES:   Allergies  Allergen Reactions   Sulfonamide Derivatives Anaphylaxis   Topamax [Topiramate] Other (See Comments)    Metabolic acidosis    Biaxin [Clarithromycin] Other (See Comments)    pericarditis   Hydrocodone Other (See Comments)    "hyper and climbing the walls"   Motrin [Ibuprofen] Other (See Comments)    headaches   Sulfa Antibiotics     Other reaction(s): Unknown   Symbicort [Budesonide-Formoterol Fumarate] Other (See Comments)    02/07/15 tremor   Tetracycline Hcl Other (See Comments)   Percocet [Oxycodone-Acetaminophen] Itching and Rash   Tape Itching and Rash    Use paper tape only   Tetracyclines & Related Other (See Comments)    "immediate yeast infection"     CURRENT MEDICATIONS:  Outpatient Encounter Medications as of 10/12/2021  Medication Sig   acetaminophen (TYLENOL) 500 MG tablet Take 2 tablets (1,000 mg total) by mouth 2 (two) times daily as needed. (Patient taking differently: Take 1,000 mg by mouth 2 (two) times daily as needed for mild pain or moderate pain.)   albuterol (VENTOLIN HFA) 108 (90 Base) MCG/ACT inhaler Inhale 2 puffs into the lungs every 6 (six) hours as needed for wheezing or shortness of breath.   amLODipine (NORVASC) 2.5 MG tablet TAKE 1 TABLET BY MOUTH ONCE A DAY   aspirin 81 MG tablet Take 1 tablet (81 mg total) by mouth daily.   Carbidopa-Levodopa ER (SINEMET CR) 25-100 MG tablet controlled release 2 at 9am, 1 at 1pm, 1 at 5pm   clonazePAM (KLONOPIN) 0.5 MG tablet TAKE 1/2 A TABLET BY MOUTH AT BEDTIME   EPINEPHrine 0.3 mg/0.3 mL IJ SOAJ injection    gabapentin ([name]) 300 MG capsule TAKE 2 CAPSULES BY MOUTH 3 TIMES DAILY   omeprazole (PRILOSEC) 20 MG capsule Take 1 capsule (20 mg total) by mouth daily.   TRELEGY ELLIPTA 100-62.5-25 MCG/INH AEPB INHALE 1 PUFF BY MOUTH ONCE A DAY   No facility-administered encounter medications on file as of 10/12/2021.    Objective:   PHYSICAL EXAMINATION:    VITALS:   Vitals:   10/12/21 1520  BP: 119/67  Pulse: 89  Weight: 134 lb (60.8 kg)  Height: 5\' 2"  (1.575 m)     GEN:  The patient appears stated age and is in NAD. HEENT:  Normocephalic, atraumatic.  The mucous membranes are moist. The superficial temporal arteries are without ropiness or tenderness. CV:  RRR Lungs:  CTAB.  There is some DOE Neck/HEME:  There are no carotid bruits bilaterally.  Neurological examination:  Orientation: The patient is alert and oriented x3. Cranial nerves: There is good facial symmetry with facial hypomimia. The speech is fluent and clear. Soft palate rises symmetrically and there is no tongue deviation. Hearing is intact to conversational tone. Sensation: Sensation is intact  to light touch throughout Motor: Strength is at least antigravity x4.  Movement examination: Tone: There is normal tone in the UE/LE Abnormal movements: Rare tremor of the left hand. Coordination:  There is mild decremation with finger taps on the right and heel/toe taps bilaterally Gait and Station: The patient has no difficulty arising out of a deep-seated chair without the use of the hands. The patient's stride length is good   I have reviewed and interpreted the following labs independently    Chemistry      Component Value Date/Time  NA 138 07/06/2021 1414   NA 141 12/23/2018 1514   NA 141 03/28/2017 1130   K 3.8 07/06/2021 1414   K 4.2 03/28/2017 1130   CL 102 07/06/2021 1414   CL 102 03/10/2013 1529   CO2 30 07/06/2021 1414   CO2 28 03/28/2017 1130   BUN 15 07/06/2021 1414   BUN 16 12/23/2018 1514   BUN 17.3 03/28/2017 1130   CREATININE 0.70 09/11/2021 1150   CREATININE 0.8 03/28/2017 1130      Component Value Date/Time   CALCIUM 9.0 07/06/2021 1414   CALCIUM 9.6 03/28/2017 1130   ALKPHOS 67 08/08/2021 1320   ALKPHOS 85 03/28/2017 1130   AST 8 08/08/2021 1320   AST 12 03/28/2017 1130   ALT 2 08/08/2021 1320   ALT <6 03/28/2017 1130   BILITOT 1.4 (H) 08/08/2021 1320   BILITOT 1.38 (H) 03/28/2017 1130       Lab Results  Component Value Date   WBC 5.4 08/08/2021   HGB 12.4 08/08/2021   HCT 37.8 08/08/2021   MCV 94.1 08/08/2021   PLT 146.0 (L) 08/08/2021    Lab Results  Component Value Date   TSH 1.893 10/26/2018     Total time spent on today's visit was 31 minutes, including both face-to-face time and nonface-to-face time.  Time included that spent on review of records (prior notes available to me/labs/imaging if pertinent), discussing treatment and goals, answering patient's questions and coordinating care.  Cc:  [name], MD

## 2021-10-12 ENCOUNTER — Ambulatory Visit: Payer: Medicare PPO | Admitting: Neurology

## 2021-10-12 ENCOUNTER — Other Ambulatory Visit: Payer: Self-pay

## 2021-10-12 ENCOUNTER — Encounter: Payer: Self-pay | Admitting: Neurology

## 2021-10-12 VITALS — BP 119/67 | HR 89 | Ht 62.0 in | Wt 134.0 lb

## 2021-10-12 DIAGNOSIS — G20 Parkinson's disease: Secondary | ICD-10-CM | POA: Diagnosis not present

## 2021-10-12 DIAGNOSIS — G903 Multi-system degeneration of the autonomic nervous system: Secondary | ICD-10-CM

## 2021-10-13 ENCOUNTER — Telehealth: Payer: Self-pay | Admitting: Family Medicine

## 2021-10-13 NOTE — Telephone Encounter (Signed)
Plz notify I touched base with Dr Tat - given concern for ongoing orthostatic hypotension, would be reasonable to hold amlodipine for now and let us know how blood pressures run. Hopefully this alone would help prevent recurrent low blood pressure issues.

## 2021-10-13 NOTE — Telephone Encounter (Signed)
Pt rtn call. I relayed Dr. G's message. Pt verbalizes understanding.  

## 2021-10-13 NOTE — Telephone Encounter (Signed)
Lvm asking pt to call back.  Need to relay Dr. G's message.  

## 2021-10-17 DIAGNOSIS — J301 Allergic rhinitis due to pollen: Secondary | ICD-10-CM | POA: Diagnosis not present

## 2021-10-17 DIAGNOSIS — J3089 Other allergic rhinitis: Secondary | ICD-10-CM | POA: Diagnosis not present

## 2021-10-24 DIAGNOSIS — J3089 Other allergic rhinitis: Secondary | ICD-10-CM | POA: Diagnosis not present

## 2021-10-24 DIAGNOSIS — J301 Allergic rhinitis due to pollen: Secondary | ICD-10-CM | POA: Diagnosis not present

## 2021-10-26 DIAGNOSIS — H2513 Age-related nuclear cataract, bilateral: Secondary | ICD-10-CM | POA: Diagnosis not present

## 2021-10-26 DIAGNOSIS — Z0100 Encounter for examination of eyes and vision without abnormal findings: Secondary | ICD-10-CM | POA: Diagnosis not present

## 2021-10-26 DIAGNOSIS — H43813 Vitreous degeneration, bilateral: Secondary | ICD-10-CM | POA: Diagnosis not present

## 2021-10-31 DIAGNOSIS — J3089 Other allergic rhinitis: Secondary | ICD-10-CM | POA: Diagnosis not present

## 2021-10-31 DIAGNOSIS — J301 Allergic rhinitis due to pollen: Secondary | ICD-10-CM | POA: Diagnosis not present

## 2021-10-31 NOTE — Telephone Encounter (Signed)
Pt called in stated her BP has been running about 175/110 and been getting headache . Would like a call back . Please Advise [phone]

## 2021-10-31 NOTE — Telephone Encounter (Signed)
Spoke with pt relaying Dr. G's message. Pt verbalizes understanding.  

## 2021-10-31 NOTE — Telephone Encounter (Signed)
Please have her check standing blood pressures - and if those stay >140/100 then restart amlodipine 2.5mg  daily.

## 2021-11-07 ENCOUNTER — Other Ambulatory Visit: Payer: Self-pay

## 2021-11-07 ENCOUNTER — Encounter: Payer: Self-pay | Admitting: Cardiovascular Disease

## 2021-11-07 ENCOUNTER — Ambulatory Visit: Payer: Medicare PPO | Admitting: Cardiovascular Disease

## 2021-11-07 VITALS — BP 140/60 | HR 85 | Ht 62.0 in | Wt 124.0 lb

## 2021-11-07 DIAGNOSIS — J301 Allergic rhinitis due to pollen: Secondary | ICD-10-CM | POA: Diagnosis not present

## 2021-11-07 DIAGNOSIS — G20 Parkinson's disease: Secondary | ICD-10-CM

## 2021-11-07 DIAGNOSIS — I951 Orthostatic hypotension: Secondary | ICD-10-CM | POA: Diagnosis not present

## 2021-11-07 DIAGNOSIS — J3089 Other allergic rhinitis: Secondary | ICD-10-CM | POA: Diagnosis not present

## 2021-11-07 DIAGNOSIS — I10 Essential (primary) hypertension: Secondary | ICD-10-CM

## 2021-11-07 DIAGNOSIS — I341 Nonrheumatic mitral (valve) prolapse: Secondary | ICD-10-CM | POA: Diagnosis not present

## 2021-11-07 DIAGNOSIS — G20A1 Parkinson's disease without dyskinesia, without mention of fluctuations: Secondary | ICD-10-CM

## 2021-11-07 NOTE — Patient Instructions (Signed)
Medication Instructions:  Your physician recommends that you continue on your current medications as directed. Please refer to the Current Medication list given to you today.  *If you need a refill on your cardiac medications before your next appointment, please call your pharmacy*   Lab Work: Bmp and Cbc today  If you have labs (blood work) drawn today and your tests are completely normal, you will receive your results only by: Man (if you have MyChart) OR A paper copy in the mail If you have any lab test that is abnormal or we need to change your treatment, we will call you to review the results.   Testing/Procedures: Your physician has requested that you have a cardiac catheterization. Cardiac catheterization is used to diagnose and/or treat various heart conditions. Doctors may recommend this procedure for a number of different reasons. The most common reason is to evaluate chest pain. Chest pain can be a symptom of coronary artery disease (CAD), and cardiac catheterization can show whether plaque is narrowing or blocking your hearts arteries. This procedure is also used to evaluate the valves, as well as measure the blood flow and oxygen levels in different parts of your heart. For further information please visit [email or web address]. Please follow instruction sheet, as given.    Follow-Up: At [facility], you and your health needs are our priority.  As part of our continuing mission to provide you with exceptional heart care, we have created designated Provider Care Teams.  These Care Teams include your primary Cardiologist (physician) and Advanced Practice Providers (APPs -  Physician Assistants and Nurse Practitioners) who all work together to provide you with the care you need, when you need it.  We recommend signing up for the patient portal called "MyChart".  Sign up information is provided on this After Visit Summary.  MyChart is used to connect with patients for  Virtual Visits (Telemedicine).  Patients are able to view lab/test results, encounter notes, upcoming appointments, etc.  Non-urgent messages can be sent to your provider as well.   To learn more about what you can do with MyChart, go to [email or web address].    Your next appointment:   4 week(s)  The format for your next appointment:   In Person  Provider:   You may see [name], MD or one of the following Advanced Practice Providers on your designated Care Team:   Murray Hodgkins, NP Christell Faith, PA-C Cadence Kathlen Mody, [place]   Other Instructions  [address] [place], [place] [address] Dept: [place]: [place]  11/07/2021  You are scheduled for a Cardiac Catheterization on Tuesday, December 20 with Dr. [name].  1. Please arrive at [facility] [place], [place] [address] at 8:30 AM (This time is one hour before your procedure to ensure your preparation). Free valet parking service is available.   Special note: Every effort is made to have your procedure done on time. Please understand that emergencies sometimes delay scheduled procedures.  2. Diet: Do not eat solid foods after midnight.  The patient may have clear liquids until 5am upon the day of the procedure.  3. Labs: You will need to have blood drawn today (bmp,cbc)  4. Medication instructions in preparation for your procedure:   Contrast Allergy: No  On the morning of your procedure, take your Aspirin and any morning medicines NOT listed above.  You may use  sips of water.  5. Plan for one night stay--bring personal belongings. 6. Bring a current list of your medications and current insurance cards. 7. You MUST have a responsible person to drive you home. 8. Someone MUST be with you the first 24 hours after you arrive home or your discharge will be  delayed. 9. Please wear clothes that are easy to get on and off and wear slip-on shoes.  Thank you for allowing [place] to care for you!   -- [facility]

## 2021-11-07 NOTE — H&P (View-Only) (Signed)
Cardiology Office Note   Date:  11/07/2021   ID:  Crystal Bass, DOB 1941/01/20, MRN [id number]  PCP:  [name], MD  Cardiologist:   [name], MD   Chief Complaint  Patient presents with   Other    4 wk f/u c/o sob. Meds reviewed verbally with pt.      History of Present Illness: Crystal Bass is a 80 y.o. female who is here today for follow-up visit regarding  mitral regurgitation.   She has chronic medical conditions that include mitral valve prolapse with moderate regurgitation, exertional dyspnea and orthostatic hypotension in the setting of Parkinson's disease. She was diagnosed with Parkinson's in 2015 . She used to be on multiple blood pressure medications which were discontinued due to orthostatic hypotension.   The patient had prior cardiac catheterization in 2012 by Dr. [name] which showed normal coronary arteries and ejection fraction and normal renal arteries.   Previous CT of the lungs showed evidence of coronary artery calcifications. Echocardiogram in October 2019 showed normal LV systolic function, grade 1 diastolic dysfunction, anterior mitral valve leaflet prolapse with moderate to severe regurgitation.  Left atrium was moderately to severely dilated.     She was hospitalized in early December, 2019 with atypical chest pain.  Initial CTA of the chest showed single small pulmonary embolism in the apical segmental branch of the left upper lobe.  There was also evidence of bronchiectasis.  Repeat CT after 2 days showed resolution of the pulmonary embolus.  The patient was treated with Eliquis for 3 months.     She continued to have exertional dyspnea and there was concern about the degree of mitral regurgitation.  I proceeded with a transesophageal echocardiogram in February, 2020 which showed that her mitral regurgitation was moderate.  Ejection fraction was normal.  She had significant worsening of dyspnea recently and thus she underwent an  echocardiogram in October which showed normal LV systolic function, grade 2 diastolic dysfunction, small pericardial effusion and moderate to severe mitral regurgitation due to significant prolapse of the anterior mitral leaflet.  She does have reported history of COPD although she never smoked.  She was most recently seen by pulmonary in 2020.  Pulmonary function testing showed minimal obstructive disease.  Given significant worsening of symptoms, I recommended proceeding with a right and left cardiac catheterization for hemodynamic evaluation of her mitral regurgitation and likely proceeding with a transesophageal echocardiogram and evaluation for mitral valve clip.  The patient wanted to think about this and she returns for follow-up.  She reports that there has been no improvement in her symptoms and she wants to proceed with the evaluation.   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   Aortic root aneurysm    Arthritis    Asthma    Breast cancer ([place]) 2002   infiltrative ductal carcinoma    Cataract 2019   COPD (chronic obstructive pulmonary disease) ([place]) 07/2007   Ductal carcinoma of breast, estrogen receptor positive, stage 1 ([place]) 09/16/2012   Endometriosis    GERD (gastroesophageal reflux disease)    Hypercalcemia 09/14/2013   Hypertension    Lesion of breast 1992   right, benign   Lung disease    secondary to [name] infection   Mastalgia 05/1994   Osteoporosis, post-menopausal 03/14/2012   Ovarian tumor of borderline malignancy, right 2004   Pancreatitis    secondary to  Cholelithiasis   Parkinson disease ([place]) 02/10/2015   Post-thoracotomy pain syndrome    Pseudomonas pneumonia ([place]) 03/2008   Traumatic closed fracture of distal clavicle with minimal displacement, left, initial encounter 06/2021   [facility]   Uterine fibroid    Vitamin D deficiency     Past Surgical History:  Procedure Laterality Date    ABDOMINAL HYSTERECTOMY     & BSO for Mucinous borderline tumor of R ovary 2004   APPENDECTOMY  2004   BREAST BIOPSY  08/2004   right breast-benign   BREAST LUMPECTOMY  1992   benign   CARDIAC CATHETERIZATION  2007   essentially negation for significant CAD   CHOLECYSTECTOMY  2001   COLONOSCOPY  2014   Dr [name] , due 2019   COLONOSCOPY  09/2018   2 TA removed, int hem, no f/u needed (Dr [name])    ESOPHAGOGASTRODUODENOSCOPY  09/2018   GERD with esophagitis and stricture dilated, antral gastritis negative for H pylori [name])   LUNG BIOPSY  2002   [name], Dr [name]   MASTECTOMY MODIFIED RADICAL Left 2002   oral chemotheraphy (tamoxifen then Armidex) no radiation, Dr.[name]   TEE WITHOUT CARDIOVERSION N/A 12/29/2018   Procedure: TRANSESOPHAGEAL ECHOCARDIOGRAM (TEE);  Surgeon: Wellington Hampshire, MD;  Location: [facility] ORS;  Service: Cardiovascular;  Laterality: N/A;   TONSILLECTOMY  1946     Current Outpatient Medications  Medication Sig Dispense Refill   acetaminophen (TYLENOL) 500 MG tablet Take 2 tablets (1,000 mg total) by mouth 2 (two) times daily as needed. (Patient taking differently: Take 1,000 mg by mouth 2 (two) times daily as needed for mild pain or moderate pain.)     albuterol (VENTOLIN HFA) 108 (90 Base) MCG/ACT inhaler Inhale 2 puffs into the lungs every 6 (six) hours as needed for wheezing or shortness of breath. 18 g 1   amLODipine (NORVASC) 2.5 MG tablet TAKE 1 TABLET BY MOUTH ONCE A DAY 30 tablet 5   aspirin 81 MG tablet Take 1 tablet (81 mg total) by mouth daily. 30 tablet    Carbidopa-Levodopa ER (SINEMET CR) 25-100 MG tablet controlled release 2 at 9am, 1 at 1pm, 1 at 5pm 360 tablet 1   clonazePAM (KLONOPIN) 0.5 MG tablet TAKE 1/2 A TABLET BY MOUTH AT BEDTIME 45 tablet 1   EPINEPHrine 0.3 mg/0.3 mL IJ SOAJ injection      gabapentin ([name]) 300 MG capsule TAKE 2 CAPSULES BY MOUTH 3 TIMES DAILY 540 capsule 1   omeprazole (PRILOSEC) 20 MG capsule Take 1 capsule (20  mg total) by mouth daily. 30 capsule 11   TRELEGY ELLIPTA 100-62.5-25 MCG/INH AEPB INHALE 1 PUFF BY MOUTH ONCE A DAY 60 each 5   No current facility-administered medications for this visit.    Allergies:   Sulfonamide derivatives, Topamax [topiramate], Biaxin [clarithromycin], Hydrocodone, Motrin [ibuprofen], Sulfa antibiotics, Symbicort [budesonide-formoterol fumarate], Tetracycline hcl, Percocet [oxycodone-acetaminophen], Tape, and Tetracyclines & related    Social History:  The patient  reports that she has never smoked. She has never used smokeless tobacco. She reports that she does not drink alcohol and does not use drugs.   Family History:  The patient's family history includes Breast cancer in her cousin; Breast cancer (age of onset: 13) in her sister; Cancer in her maternal grandfather, maternal grandmother, maternal uncle, maternal uncle, and maternal uncle; Colon cancer in her sister; Colon cancer (age of onset: 62) in her cousin; Emphysema in her mother; Liver cancer in her maternal aunt; Lung cancer (age of  onset: 81) in her cousin; Lung cancer (age of onset: 37) in her father; Melanoma in her maternal aunt; Myasthenia gravis in her paternal aunt; Osteoporosis in her paternal aunt; Parkinson's disease in her cousin; Stroke in her paternal aunt.    ROS:  Please see the history of present illness.   Otherwise, review of systems are positive for none.   All other systems are reviewed and negative.    PHYSICAL EXAM: VS:  BP 140/60 (BP Location: Left Arm, Patient Position: Sitting, Cuff Size: Normal)    Pulse 85    Ht 5\' 2"  (1.575 m)    Wt 124 lb (56.2 kg)    SpO2 91%    BMI 22.68 kg/m  , BMI Body mass index is 22.68 kg/m. GEN: Well nourished, well developed, in no acute distress  HEENT: normal  Neck: no JVD, carotid bruits, or masses Cardiac: RRR; no rubs, or gallops,no edema .  2 out of 6 holosystolic murmur at the apex and the left sternal border. Respiratory:  clear to  auscultation bilaterally, normal work of breathing GI: soft, nontender, nondistended, + BS MS: no deformity or atrophy  Skin: warm and dry, no rash Neuro:  Strength and sensation are intact Psych: euthymic mood, full affect   EKG:  EKG is not ordered today.    Recent Labs: 07/06/2021: BUN 15; Potassium 3.8; Sodium 138 08/08/2021: ALT 2; Hemoglobin 12.4; Platelets 146.0 09/11/2021: Creatinine, Ser 0.70    Lipid Panel    Component Value Date/Time   CHOL 171 08/08/2021 1320   TRIG 100.0 08/08/2021 1320   HDL 53.30 08/08/2021 1320   CHOLHDL 3 08/08/2021 1320   VLDL 20.0 08/08/2021 1320   LDLCALC 97 08/08/2021 1320   LDLDIRECT 135.0 08/16/2009 1359      Wt Readings from Last 3 Encounters:  11/07/21 124 lb (56.2 kg)  10/12/21 134 lb (60.8 kg)  10/03/21 129 lb 8 oz (58.7 kg)      No flowsheet data found.    ASSESSMENT AND PLAN:  1.   Mitral valve prolapse with moderate to severe mitral regurgitation, she seems to be having worsening symptoms of exertional dyspnea and orthopnea.  It is highly possible that this is due to progression of mitral regurgitation.  In addition, she does have underlying pulmonary disease but based on her pulmonary function testing in 2020, it does not appear to be severe enough to explain her current worsening of symptoms.   I recommend proceeding with a right and left cardiac catheterization.  I discussed the procedure in details as well as risk and benefits.  Based on the results, we will refer her to the valve clinic for evaluation for possible mitral valve clip if she is deemed to be a candidate given that I do not think she is a good surgical candidate.  2.  Orthostatic hypotension: Her symptoms seem to be improved compared to before.  3.  Essential hypertension: Blood pressure is controlled on small dose amlodipine.  4.  Parkinson's disease: Neurologic symptoms seem to be stable.    Disposition:   FU with me in 1  months  Signed,  [name], MD  11/07/2021 2:05 PM    [place]

## 2021-11-07 NOTE — Progress Notes (Signed)
Cardiology Office Note   Date:  11/07/2021   ID:  Crystal Bass, DOB Apr 26, 1941, MRN [id number]  PCP:  [name], MD  Cardiologist:   [name], MD   Chief Complaint  Patient presents with   Other    4 wk f/u c/o sob. Meds reviewed verbally with pt.      History of Present Illness: Crystal Bass is a 80 y.o. female who is here today for follow-up visit regarding  mitral regurgitation.   She has chronic medical conditions that include mitral valve prolapse with moderate regurgitation, exertional dyspnea and orthostatic hypotension in the setting of Parkinson's disease. She was diagnosed with Parkinson's in 2015 . She used to be on multiple blood pressure medications which were discontinued due to orthostatic hypotension.   The patient had prior cardiac catheterization in 2012 by Dr. [name] which showed normal coronary arteries and ejection fraction and normal renal arteries.   Previous CT of the lungs showed evidence of coronary artery calcifications. Echocardiogram in October 2019 showed normal LV systolic function, grade 1 diastolic dysfunction, anterior mitral valve leaflet prolapse with moderate to severe regurgitation.  Left atrium was moderately to severely dilated.     She was hospitalized in early December, 2019 with atypical chest pain.  Initial CTA of the chest showed single small pulmonary embolism in the apical segmental branch of the left upper lobe.  There was also evidence of bronchiectasis.  Repeat CT after 2 days showed resolution of the pulmonary embolus.  The patient was treated with Eliquis for 3 months.     She continued to have exertional dyspnea and there was concern about the degree of mitral regurgitation.  I proceeded with a transesophageal echocardiogram in February, 2020 which showed that her mitral regurgitation was moderate.  Ejection fraction was normal.  She had significant worsening of dyspnea recently and thus she underwent an  echocardiogram in October which showed normal LV systolic function, grade 2 diastolic dysfunction, small pericardial effusion and moderate to severe mitral regurgitation due to significant prolapse of the anterior mitral leaflet.  She does have reported history of COPD although she never smoked.  She was most recently seen by pulmonary in 2020.  Pulmonary function testing showed minimal obstructive disease.  Given significant worsening of symptoms, I recommended proceeding with a right and left cardiac catheterization for hemodynamic evaluation of her mitral regurgitation and likely proceeding with a transesophageal echocardiogram and evaluation for mitral valve clip.  The patient wanted to think about this and she returns for follow-up.  She reports that there has been no improvement in her symptoms and she wants to proceed with the evaluation.   Past Medical History:  Diagnosis Date   Aneurysm of aorta ([place])    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   Aortic root aneurysm    Arthritis    Asthma    Breast cancer ([place]) 2002   infiltrative ductal carcinoma    Cataract 2019   COPD (chronic obstructive pulmonary disease) ([place]) 07/2007   Ductal carcinoma of breast, estrogen receptor positive, stage 1 ([place]) 09/16/2012   Endometriosis    GERD (gastroesophageal reflux disease)    Hypercalcemia 09/14/2013   Hypertension    Lesion of breast 1992   right, benign   Lung disease    secondary to [name] infection   Mastalgia 05/1994   Osteoporosis, post-menopausal 03/14/2012   Ovarian tumor of borderline malignancy, right 2004   Pancreatitis    secondary to  Cholelithiasis   Parkinson disease ([place]) 02/10/2015   Post-thoracotomy pain syndrome    Pseudomonas pneumonia ([place]) 03/2008   Traumatic closed fracture of distal clavicle with minimal displacement, left, initial encounter 06/2021   [facility]   Uterine fibroid    Vitamin D deficiency     Past Surgical History:  Procedure Laterality Date    ABDOMINAL HYSTERECTOMY     & BSO for Mucinous borderline tumor of R ovary 2004   APPENDECTOMY  2004   BREAST BIOPSY  08/2004   right breast-benign   BREAST LUMPECTOMY  1992   benign   CARDIAC CATHETERIZATION  2007   essentially negation for significant CAD   CHOLECYSTECTOMY  2001   COLONOSCOPY  2014   Dr [name] , due 2019   COLONOSCOPY  09/2018   2 TA removed, int hem, no f/u needed (Dr [name])    ESOPHAGOGASTRODUODENOSCOPY  09/2018   GERD with esophagitis and stricture dilated, antral gastritis negative for H pylori [name])   LUNG BIOPSY  2002   [name], Dr [name]   MASTECTOMY MODIFIED RADICAL Left 2002   oral chemotheraphy (tamoxifen then Armidex) no radiation, Dr.[name]   TEE WITHOUT CARDIOVERSION N/A 12/29/2018   Procedure: TRANSESOPHAGEAL ECHOCARDIOGRAM (TEE);  Surgeon: Wellington Hampshire, MD;  Location: [facility] ORS;  Service: Cardiovascular;  Laterality: N/A;   TONSILLECTOMY  1946     Current Outpatient Medications  Medication Sig Dispense Refill   acetaminophen (TYLENOL) 500 MG tablet Take 2 tablets (1,000 mg total) by mouth 2 (two) times daily as needed. (Patient taking differently: Take 1,000 mg by mouth 2 (two) times daily as needed for mild pain or moderate pain.)     albuterol (VENTOLIN HFA) 108 (90 Base) MCG/ACT inhaler Inhale 2 puffs into the lungs every 6 (six) hours as needed for wheezing or shortness of breath. 18 g 1   amLODipine (NORVASC) 2.5 MG tablet TAKE 1 TABLET BY MOUTH ONCE A DAY 30 tablet 5   aspirin 81 MG tablet Take 1 tablet (81 mg total) by mouth daily. 30 tablet    Carbidopa-Levodopa ER (SINEMET CR) 25-100 MG tablet controlled release 2 at 9am, 1 at 1pm, 1 at 5pm 360 tablet 1   clonazePAM (KLONOPIN) 0.5 MG tablet TAKE 1/2 A TABLET BY MOUTH AT BEDTIME 45 tablet 1   EPINEPHrine 0.3 mg/0.3 mL IJ SOAJ injection      gabapentin ([name]) 300 MG capsule TAKE 2 CAPSULES BY MOUTH 3 TIMES DAILY 540 capsule 1   omeprazole (PRILOSEC) 20 MG capsule Take 1 capsule (20  mg total) by mouth daily. 30 capsule 11   TRELEGY ELLIPTA 100-62.5-25 MCG/INH AEPB INHALE 1 PUFF BY MOUTH ONCE A DAY 60 each 5   No current facility-administered medications for this visit.    Allergies:   Sulfonamide derivatives, Topamax [topiramate], Biaxin [clarithromycin], Hydrocodone, Motrin [ibuprofen], Sulfa antibiotics, Symbicort [budesonide-formoterol fumarate], Tetracycline hcl, Percocet [oxycodone-acetaminophen], Tape, and Tetracyclines & related    Social History:  The patient  reports that she has never smoked. She has never used smokeless tobacco. She reports that she does not drink alcohol and does not use drugs.   Family History:  The patient's family history includes Breast cancer in her cousin; Breast cancer (age of onset: 38) in her sister; Cancer in her maternal grandfather, maternal grandmother, maternal uncle, maternal uncle, and maternal uncle; Colon cancer in her sister; Colon cancer (age of onset: 22) in her cousin; Emphysema in her mother; Liver cancer in her maternal aunt; Lung cancer (age of  onset: 35) in her cousin; Lung cancer (age of onset: 69) in her father; Melanoma in her maternal aunt; Myasthenia gravis in her paternal aunt; Osteoporosis in her paternal aunt; Parkinson's disease in her cousin; Stroke in her paternal aunt.    ROS:  Please see the history of present illness.   Otherwise, review of systems are positive for none.   All other systems are reviewed and negative.    PHYSICAL EXAM: VS:  BP 140/60 (BP Location: Left Arm, Patient Position: Sitting, Cuff Size: Normal)    Pulse 85    Ht 5\' 2"  (1.575 m)    Wt 124 lb (56.2 kg)    SpO2 91%    BMI 22.68 kg/m  , BMI Body mass index is 22.68 kg/m. GEN: Well nourished, well developed, in no acute distress  HEENT: normal  Neck: no JVD, carotid bruits, or masses Cardiac: RRR; no rubs, or gallops,no edema .  2 out of 6 holosystolic murmur at the apex and the left sternal border. Respiratory:  clear to  auscultation bilaterally, normal work of breathing GI: soft, nontender, nondistended, + BS MS: no deformity or atrophy  Skin: warm and dry, no rash Neuro:  Strength and sensation are intact Psych: euthymic mood, full affect   EKG:  EKG is not ordered today.    Recent Labs: 07/06/2021: BUN 15; Potassium 3.8; Sodium 138 08/08/2021: ALT 2; Hemoglobin 12.4; Platelets 146.0 09/11/2021: Creatinine, Ser 0.70    Lipid Panel    Component Value Date/Time   CHOL 171 08/08/2021 1320   TRIG 100.0 08/08/2021 1320   HDL 53.30 08/08/2021 1320   CHOLHDL 3 08/08/2021 1320   VLDL 20.0 08/08/2021 1320   LDLCALC 97 08/08/2021 1320   LDLDIRECT 135.0 08/16/2009 1359      Wt Readings from Last 3 Encounters:  11/07/21 124 lb (56.2 kg)  10/12/21 134 lb (60.8 kg)  10/03/21 129 lb 8 oz (58.7 kg)      No flowsheet data found.    ASSESSMENT AND PLAN:  1.   Mitral valve prolapse with moderate to severe mitral regurgitation, she seems to be having worsening symptoms of exertional dyspnea and orthopnea.  It is highly possible that this is due to progression of mitral regurgitation.  In addition, she does have underlying pulmonary disease but based on her pulmonary function testing in 2020, it does not appear to be severe enough to explain her current worsening of symptoms.   I recommend proceeding with a right and left cardiac catheterization.  I discussed the procedure in details as well as risk and benefits.  Based on the results, we will refer her to the valve clinic for evaluation for possible mitral valve clip if she is deemed to be a candidate given that I do not think she is a good surgical candidate.  2.  Orthostatic hypotension: Her symptoms seem to be improved compared to before.  3.  Essential hypertension: Blood pressure is controlled on small dose amlodipine.  4.  Parkinson's disease: Neurologic symptoms seem to be stable.    Disposition:   FU with me in 1  months  Signed,  [name], MD  11/07/2021 2:05 PM    [place]

## 2021-11-08 LAB — CBC WITH DIFFERENTIAL/PLATELET
Basophils Absolute: 0.1 10*3/uL (ref 0.0–0.2)
Basos: 1 %
EOS (ABSOLUTE): 0.2 10*3/uL (ref 0.0–0.4)
Eos: 3 %
Hematocrit: 39.7 % (ref 34.0–46.6)
Hemoglobin: 12.8 g/dL (ref 11.1–15.9)
Immature Grans (Abs): 0 10*3/uL (ref 0.0–0.1)
Immature Granulocytes: 0 %
Lymphocytes Absolute: 0.9 10*3/uL (ref 0.7–3.1)
Lymphs: 16 %
MCH: 30 pg (ref 26.6–33.0)
MCHC: 32.2 g/dL (ref 31.5–35.7)
MCV: 93 fL (ref 79–97)
Monocytes Absolute: 0.6 10*3/uL (ref 0.1–0.9)
Monocytes: 11 %
Neutrophils Absolute: 3.8 10*3/uL (ref 1.4–7.0)
Neutrophils: 69 %
Platelets: 186 10*3/uL (ref 150–450)
RBC: 4.27 x10E6/uL (ref 3.77–5.28)
RDW: 12.5 % (ref 11.7–15.4)
WBC: 5.5 10*3/uL (ref 3.4–10.8)

## 2021-11-08 LAB — BASIC METABOLIC PANEL
BUN/Creatinine Ratio: 18 (ref 12–28)
BUN: 14 mg/dL (ref 8–27)
CO2: 24 mmol/L (ref 20–29)
Calcium: 9.2 mg/dL (ref 8.7–10.3)
Chloride: 103 mmol/L (ref 96–106)
Creatinine, Ser: 0.8 mg/dL (ref 0.57–1.00)
Glucose: 83 mg/dL (ref 70–99)
Potassium: 3.5 mmol/L (ref 3.5–5.2)
Sodium: 141 mmol/L (ref 134–144)
eGFR: 75 mL/min/{1.73_m2} (ref >59)

## 2021-11-09 ENCOUNTER — Telehealth: Payer: Self-pay | Admitting: Cardiovascular Disease

## 2021-11-09 ENCOUNTER — Telehealth: Payer: Self-pay | Admitting: Family Medicine

## 2021-11-09 NOTE — Telephone Encounter (Signed)
Pt called in requesting antibiotics call in for possible UTI pt stated she has a appointment for catheterization on 11/14/21 would like a call back [phone]

## 2021-11-09 NOTE — Telephone Encounter (Signed)
DPR on file. Lmom patient pre-procedure labs were normal. Her WBC were normal. She should f/u as planned with her pco for tx of UTI. Ok to proceed with cath. Pt should call back if any questions or concerns.

## 2021-11-09 NOTE — Telephone Encounter (Signed)
Lvm asking pt to call back.  Needs an OV to be evaluated.

## 2021-11-09 NOTE — Telephone Encounter (Signed)
Patient calling to report she has an untreated uti and wants to know if this will delay cath procedure.   Patient advised to reach out to pcp office re :uti while waiting on response about procedure .

## 2021-11-10 NOTE — Telephone Encounter (Signed)
Lvm asking pt to call back.  Needs an OV to be evaluated.

## 2021-11-13 ENCOUNTER — Telehealth: Payer: Self-pay | Admitting: Cardiovascular Disease

## 2021-11-13 ENCOUNTER — Telehealth: Payer: Self-pay | Admitting: Family Medicine

## 2021-11-13 NOTE — Telephone Encounter (Signed)
Received a call from [facility] living facility The patient is having a heart catheter placed on 12.20.22 and will need to be released to an assisted living facility since she currently lives alone.  FL2 needs to be completed today  Type of form FL2 (form being faxed now)  Please fax completed FL2 to Attn: Jackey Loge [phone]

## 2021-11-13 NOTE — Telephone Encounter (Signed)
Faxed form to attn:  Jackey Loge at [phone].

## 2021-11-13 NOTE — Telephone Encounter (Signed)
Patient calling Would like to clarify cath instructions and medications for procedure in the morning please call to discuss

## 2021-11-13 NOTE — Telephone Encounter (Signed)
Noted.   Will watch for form.

## 2021-11-13 NOTE — Telephone Encounter (Signed)
Signed and in Lisa's box.

## 2021-11-13 NOTE — Telephone Encounter (Signed)
Called pt.   Pt was confused by wording on cath instructions regarding what medications to take and to hold. Clarified with patient that she's not on any medications that need to be held, and verified that she does need to take her aspirin in the morning as usual.   Pt verbalized understanding, voiced appreciation for the call.

## 2021-11-13 NOTE — Telephone Encounter (Signed)
Mailing a letter.

## 2021-11-13 NOTE — Telephone Encounter (Signed)
FL2 has been sent to [facility], they are needing this back today

## 2021-11-13 NOTE — Telephone Encounter (Signed)
Placed form in Dr. G's box.  

## 2021-11-14 ENCOUNTER — Encounter
Admission: RE | Disposition: A | Discharge status: Home / Self Care | Payer: Self-pay | Source: Home / Self Care | Attending: Cardiovascular Disease

## 2021-11-14 ENCOUNTER — Ambulatory Visit
Admission: RE | Admit: 2021-11-14 | Discharge: 2021-11-14 | Disposition: A | Discharge status: Home / Self Care | Payer: Medicare PPO | Attending: Cardiovascular Disease | Admitting: Cardiovascular Disease

## 2021-11-14 ENCOUNTER — Encounter: Payer: Self-pay | Admitting: Cardiovascular Disease

## 2021-11-14 ENCOUNTER — Other Ambulatory Visit: Payer: Self-pay

## 2021-11-14 DIAGNOSIS — I059 Rheumatic mitral valve disease, unspecified: Secondary | ICD-10-CM | POA: Diagnosis not present

## 2021-11-14 DIAGNOSIS — G20 Parkinson's disease: Secondary | ICD-10-CM | POA: Insufficient documentation

## 2021-11-14 DIAGNOSIS — I341 Nonrheumatic mitral (valve) prolapse: Secondary | ICD-10-CM

## 2021-11-14 DIAGNOSIS — I951 Orthostatic hypotension: Secondary | ICD-10-CM | POA: Diagnosis not present

## 2021-11-14 DIAGNOSIS — I10 Essential (primary) hypertension: Secondary | ICD-10-CM | POA: Diagnosis not present

## 2021-11-14 DIAGNOSIS — R0609 Other forms of dyspnea: Secondary | ICD-10-CM | POA: Diagnosis not present

## 2021-11-14 DIAGNOSIS — I272 Pulmonary hypertension, unspecified: Secondary | ICD-10-CM | POA: Diagnosis not present

## 2021-11-14 HISTORY — PX: RIGHT/LEFT HEART CATH AND CORONARY ANGIOGRAPHY: CATH118266

## 2021-11-14 SURGERY — RIGHT/LEFT HEART CATH AND CORONARY ANGIOGRAPHY
Anesthesia: Moderate Sedation | Laterality: Bilateral

## 2021-11-14 MED ORDER — SODIUM CHLORIDE 0.9% FLUSH: 3.0000 mL | Freq: Two times a day (BID) | INTRAVENOUS | Status: DC

## 2021-11-14 MED ORDER — SODIUM CHLORIDE 0.9% FLUSH: 3.0000 mL | INTRAVENOUS | Status: DC | PRN

## 2021-11-14 MED ORDER — FENTANYL CITRATE (PF) 100 MCG/2ML IJ SOLN
INTRAMUSCULAR | Status: DC | PRN
  Administered 2021-11-14: 12.5 ug via INTRAVENOUS

## 2021-11-14 MED ORDER — IOHEXOL 300 MG/ML  SOLN
INTRAMUSCULAR | Status: DC | PRN
  Administered 2021-11-14: 11:00:00 54 mL

## 2021-11-14 MED ORDER — FENTANYL CITRATE (PF) 100 MCG/2ML IJ SOLN
INTRAMUSCULAR | Status: AC
  Filled 2021-11-14: qty 2

## 2021-11-14 MED ORDER — HEPARIN (PORCINE) IN NACL 1000-0.9 UT/500ML-% IV SOLN
INTRAVENOUS | Status: AC
  Filled 2021-11-14: qty 1000

## 2021-11-14 MED ORDER — VERAPAMIL HCL 2.5 MG/ML IV SOLN
INTRAVENOUS | Status: AC
  Filled 2021-11-14: qty 2

## 2021-11-14 MED ORDER — SODIUM CHLORIDE 0.9 % IV SOLN: INTRAVENOUS | Status: AC

## 2021-11-14 MED ORDER — MIDAZOLAM HCL 2 MG/2ML IJ SOLN
INTRAMUSCULAR | Status: AC
  Filled 2021-11-14: qty 2

## 2021-11-14 MED ORDER — SODIUM CHLORIDE 0.9 % IV SOLN: 250.0000 mL | INTRAVENOUS | Status: DC | PRN

## 2021-11-14 MED ORDER — SODIUM CHLORIDE 0.9 % IV SOLN: INTRAVENOUS | Status: DC

## 2021-11-14 MED ORDER — HEPARIN (PORCINE) IN NACL 2000-0.9 UNIT/L-% IV SOLN
INTRAVENOUS | Status: DC | PRN
  Administered 2021-11-14: 1000 mL

## 2021-11-14 MED ORDER — HEPARIN SODIUM (PORCINE) 1000 UNIT/ML IJ SOLN
INTRAMUSCULAR | Status: DC | PRN
  Administered 2021-11-14: 2500 [IU] via INTRAVENOUS

## 2021-11-14 MED ORDER — LIDOCAINE HCL (PF) 1 % IJ SOLN
INTRAMUSCULAR | Status: DC | PRN
  Administered 2021-11-14: 3 mL

## 2021-11-14 MED ORDER — HEPARIN SODIUM (PORCINE) 1000 UNIT/ML IJ SOLN
INTRAMUSCULAR | Status: AC
  Filled 2021-11-14: qty 10

## 2021-11-14 MED ORDER — ACETAMINOPHEN 325 MG PO TABS: 650.0000 mg | ORAL_TABLET | ORAL | Status: DC | PRN

## 2021-11-14 MED ORDER — VERAPAMIL HCL 2.5 MG/ML IV SOLN
INTRAVENOUS | Status: DC | PRN
  Administered 2021-11-14: 2.5 mg via INTRA_ARTERIAL

## 2021-11-14 MED ORDER — ONDANSETRON HCL 4 MG/2ML IJ SOLN: 4.0000 mg | Freq: Four times a day (QID) | INTRAMUSCULAR | Status: DC | PRN

## 2021-11-14 MED ORDER — LIDOCAINE HCL 1 % IJ SOLN
INTRAMUSCULAR | Status: AC
  Filled 2021-11-14: qty 20

## 2021-11-14 MED ORDER — MIDAZOLAM HCL 2 MG/2ML IJ SOLN
INTRAMUSCULAR | Status: DC | PRN
  Administered 2021-11-14: .5 mg via INTRAVENOUS

## 2021-11-14 SURGICAL SUPPLY — 12 items
CATH 5FR JL3.5 JR4 ANG PIG MP (CATHETERS) ×2 IMPLANT
CATH BALLN WEDGE 5F 110CM (CATHETERS) ×2 IMPLANT
DEVICE RAD TR BAND REGULAR (VASCULAR PRODUCTS) ×2 IMPLANT
DRAPE BRACHIAL (DRAPES) ×4 IMPLANT
GLIDESHEATH SLEND SS 6F .021 (SHEATH) ×2 IMPLANT
GUIDEWIRE INQWIRE 1.5J.035X260 (WIRE) IMPLANT
INQWIRE 1.5J .035X260CM (WIRE) ×3 IMPLANT
PACK CARDIAC CATH (CUSTOM PROCEDURE TRAY) ×4 IMPLANT
PROTECTION STATION PRESSURIZED (MISCELLANEOUS) ×3 IMPLANT
SET ATX SIMPLICITY (MISCELLANEOUS) ×2 IMPLANT
SHEATH GLIDE SLENDER 4/5FR (SHEATH) ×2 IMPLANT
STATION PROTECTION PRESSURIZED (MISCELLANEOUS) IMPLANT

## 2021-11-14 NOTE — Interval H&P Note (Signed)
History and Physical Interval Note:  11/14/2021 10:18 AM  Crystal Bass  has presented today for surgery, with the diagnosis of RT and LT Cath   Mitral valve disease.  The various methods of treatment have been discussed with the patient and family. After consideration of risks, benefits and other options for treatment, the patient has consented to  Procedure(s): RIGHT/LEFT HEART CATH AND CORONARY ANGIOGRAPHY (Bilateral) as a surgical intervention.  The patient's history has been reviewed, patient examined, no change in status, stable for surgery.  I have reviewed the patient's chart and labs.  Questions were answered to the patient's satisfaction.     [name]

## 2021-11-15 ENCOUNTER — Encounter: Payer: Self-pay | Admitting: Cardiovascular Disease

## 2021-11-15 NOTE — Telephone Encounter (Signed)
[facility] called they have received the FL2 form

## 2021-11-15 NOTE — Telephone Encounter (Signed)
Received another faxed FL2 form from [facility].  We faxed one back to [facility] on 11/13/21.    Lvm asking Cherese to call back.  Need to know if she received previous form and if this is the same one or a new form that needs to be signed.

## 2021-11-15 NOTE — Telephone Encounter (Signed)
Noted  

## 2021-11-21 DIAGNOSIS — J3089 Other allergic rhinitis: Secondary | ICD-10-CM | POA: Diagnosis not present

## 2021-11-21 DIAGNOSIS — T783XXA Angioneurotic edema, initial encounter: Secondary | ICD-10-CM | POA: Diagnosis not present

## 2021-11-21 DIAGNOSIS — J301 Allergic rhinitis due to pollen: Secondary | ICD-10-CM | POA: Diagnosis not present

## 2021-11-21 DIAGNOSIS — J4530 Mild persistent asthma, uncomplicated: Secondary | ICD-10-CM | POA: Diagnosis not present

## 2021-12-05 ENCOUNTER — Ambulatory Visit: Payer: Medicare PPO | Admitting: Cardiovascular Disease

## 2021-12-05 ENCOUNTER — Encounter: Payer: Self-pay | Admitting: Cardiovascular Disease

## 2021-12-05 ENCOUNTER — Other Ambulatory Visit: Payer: Self-pay

## 2021-12-05 VITALS — BP 130/80 | HR 70 | Ht 62.0 in | Wt 122.0 lb

## 2021-12-05 DIAGNOSIS — I10 Essential (primary) hypertension: Secondary | ICD-10-CM | POA: Diagnosis not present

## 2021-12-05 DIAGNOSIS — I059 Rheumatic mitral valve disease, unspecified: Secondary | ICD-10-CM

## 2021-12-05 NOTE — Progress Notes (Signed)
Cardiology Office Note   Date:  12/05/2021   ID:  Crystal Bass, DOB 1941/06/28, MRN [id number]  PCP:  [name], MD  Cardiologist:   [name], MD   Chief Complaint  Patient presents with   Other    4 wk f/u post cath. Meds reviewed verbally with pt.      History of Present Illness: Crystal Bass is a 81 y.o. female who is here today for follow-up visit regarding mitral regurgitation.   She has chronic medical conditions that include mitral valve prolapse with regurgitation, exertional dyspnea and orthostatic hypotension in the setting of Parkinson's disease. She was diagnosed with Parkinson's in 2015 . She used to be on multiple blood pressure medications which were discontinued due to orthostatic hypotension.   The patient had prior cardiac catheterization in 2012 by Dr. [name] which showed normal coronary arteries and ejection fraction and normal renal arteries.   Echocardiogram in October 2019 showed normal LV systolic function, grade 1 diastolic dysfunction, anterior mitral valve leaflet prolapse with moderate to severe regurgitation.  Left atrium was moderately to severely dilated.     She was hospitalized in early December, 2019 with atypical chest pain.  Initial CTA of the chest showed single small pulmonary embolism in the apical segmental branch of the left upper lobe.  There was also evidence of bronchiectasis.  Repeat CT after 2 days showed resolution of the pulmonary embolus.  The patient was treated with Eliquis for 3 months.     TEE in February 2020 showed that her mitral regurgitation was moderate.  She was seen recently for significant worsening of exertional dyspnea.  She underwent an echocardiogram in October which showed normal LV systolic function, grade 2 diastolic dysfunction, small pericardial effusion and moderate to severe mitral regurgitation due to significant prolapse of the anterior mitral leaflet.  She does have reported history  of COPD although she never smoked.  She was most recently seen by pulmonary in 2020.  Pulmonary function testing showed minimal obstructive disease.  Given significant worsening of symptoms, I proceeded with a right and left cardiac catheterization which was done last month and showed normal coronary arteries and normal LV systolic function.  Mitral regurgitation was moderate to severe by ventricular angiography and severe by hemodynamic evaluation.  Right heart catheterization showed mildly elevated filling pressures, moderate pulmonary hypertension and normal cardiac output.  Giant V waves were noted on pulmonary capillary wedge tracings (34 mm Hg).  Her symptoms are stable with no chest pain.  She continues to have dyspnea with minimal exertion.  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   Aortic root aneurysm    Arthritis    Asthma    Breast cancer ([place]) 2002   infiltrative ductal carcinoma    Cataract 2019   COPD (chronic obstructive pulmonary disease) ([place]) 07/2007   Ductal carcinoma of breast, estrogen receptor positive, stage 1 ([place]) 09/16/2012   Endometriosis    GERD (gastroesophageal reflux disease)    Hypercalcemia 09/14/2013   Hypertension    Lesion of breast 1992   right, benign   Lung disease    secondary to [name] infection   Mastalgia 05/1994   Osteoporosis, post-menopausal 03/14/2012   Ovarian tumor of borderline malignancy, right 2004   Pancreatitis    secondary to Cholelithiasis   Parkinson disease ([place]) 02/10/2015   Post-thoracotomy pain syndrome    Pseudomonas  pneumonia ([place]) 03/2008   Traumatic closed fracture of distal clavicle with minimal displacement, left, initial encounter 06/2021   [facility]   Uterine fibroid    Vitamin D deficiency     Past Surgical History:  Procedure Laterality Date   ABDOMINAL HYSTERECTOMY     & BSO for Mucinous borderline tumor of R ovary 2004   APPENDECTOMY  2004    BREAST BIOPSY  08/2004   right breast-benign   BREAST LUMPECTOMY  1992   benign   CARDIAC CATHETERIZATION  2007   essentially negation for significant CAD   CHOLECYSTECTOMY  2001   COLONOSCOPY  2014   Dr [name] , due 2019   COLONOSCOPY  09/2018   2 TA removed, int hem, no f/u needed (Dr [name])    ESOPHAGOGASTRODUODENOSCOPY  09/2018   GERD with esophagitis and stricture dilated, antral gastritis negative for H pylori [name])   LUNG BIOPSY  2002   [name], Dr [name]   MASTECTOMY MODIFIED RADICAL Left 2002   oral chemotheraphy (tamoxifen then Armidex) no radiation, Dr.[name]   RIGHT/LEFT HEART CATH AND CORONARY ANGIOGRAPHY Bilateral 11/14/2021   Procedure: RIGHT/LEFT HEART CATH AND CORONARY ANGIOGRAPHY;  Surgeon: Wellington Hampshire, MD;  Location: [place] [facility];  Service: Cardiovascular;  Laterality: Bilateral;   TEE WITHOUT CARDIOVERSION N/A 12/29/2018   Procedure: TRANSESOPHAGEAL ECHOCARDIOGRAM (TEE);  Surgeon: Wellington Hampshire, MD;  Location: [facility];  Service: Cardiovascular;  Laterality: N/A;   TONSILLECTOMY  1946     Current Outpatient Medications  Medication Sig Dispense Refill   acetaminophen (TYLENOL) 500 MG tablet Take 2 tablets (1,000 mg total) by mouth 2 (two) times daily as needed. (Patient taking differently: Take 1,000 mg by mouth 2 (two) times daily as needed for mild pain or moderate pain.)     albuterol (VENTOLIN HFA) 108 (90 Base) MCG/ACT inhaler Inhale 2 puffs into the lungs every 6 (six) hours as needed for wheezing or shortness of breath. 18 g 1   amLODipine (NORVASC) 2.5 MG tablet TAKE 1 TABLET BY MOUTH ONCE A DAY 30 tablet 5   aspirin 81 MG tablet Take 1 tablet (81 mg total) by mouth daily. 30 tablet    Carbidopa-Levodopa ER (SINEMET CR) 25-100 MG tablet controlled release 2 at 9am, 1 at 1pm, 1 at 5pm 360 tablet 1   clonazePAM (KLONOPIN) 0.5 MG tablet TAKE 1/2 A TABLET BY MOUTH AT BEDTIME 45 tablet 1   EPINEPHrine 0.3 mg/0.3 mL IJ SOAJ injection Inject  0.3 mg into the muscle as needed for anaphylaxis.     fexofenadine (ALLEGRA) 180 MG tablet Take 180 mg by mouth daily.     gabapentin ([name]) 300 MG capsule TAKE 2 CAPSULES BY MOUTH 3 TIMES DAILY 540 capsule 1   omeprazole (PRILOSEC) 20 MG capsule Take 1 capsule (20 mg total) by mouth daily. 30 capsule 11   OVER THE COUNTER MEDICATION Apply 1 application topically daily as needed (congestion). [place] ointment     TRELEGY ELLIPTA 100-62.5-25 MCG/INH AEPB INHALE 1 PUFF BY MOUTH ONCE A DAY 60 each 5   No current facility-administered medications for this visit.    Allergies:   Sulfa antibiotics, Sulfonamide derivatives, Topamax [topiramate], Biaxin [clarithromycin], Hydrocodone, Motrin [ibuprofen], Symbicort [budesonide-formoterol fumarate], Percocet [oxycodone-acetaminophen], Tape, and Tetracyclines & related    Social History:  The patient  reports that she has never smoked. She has never used smokeless tobacco. She reports that she does not drink alcohol and does not use drugs.   Family History:  The patient's family history includes Breast cancer in her cousin; Breast cancer (age of onset: 30) in her sister; Cancer in her maternal grandfather, maternal grandmother, maternal uncle, maternal uncle, and maternal uncle; Colon cancer in her sister; Colon cancer (age of onset: 47) in her cousin; Emphysema in her mother; Liver cancer in her maternal aunt; Lung cancer (age of onset: 26) in her cousin; Lung cancer (age of onset: 91) in her father; Melanoma in her maternal aunt; Myasthenia gravis in her paternal aunt; Osteoporosis in her paternal aunt; Parkinson's disease in her cousin; Stroke in her paternal aunt.    ROS:  Please see the history of present illness.   Otherwise, review of systems are positive for none.   All other systems are reviewed and negative.    PHYSICAL EXAM: VS:  BP 130/80 (BP Location: Left Arm, Patient Position: Sitting, Cuff Size: Normal)    Pulse 70    Ht 5\' 2"  (1.575  m)    Wt 122 lb (55.3 kg)    SpO2 97%    BMI 22.31 kg/m  , BMI Body mass index is 22.31 kg/m. GEN: Well nourished, well developed, in no acute distress  HEENT: normal  Neck: no JVD, carotid bruits, or masses Cardiac: RRR; no rubs, or gallops,no edema .  2/6 holosystolic murmur at the apex and the left sternal border. Respiratory:  clear to auscultation bilaterally, normal work of breathing GI: soft, nontender, nondistended, + BS MS: no deformity or atrophy  Skin: warm and dry, no rash Neuro:  Strength and sensation are intact Psych: euthymic mood, full affect   EKG:  EKG is not ordered today.    Recent Labs: 08/08/2021: ALT 2 11/07/2021: BUN 14; Creatinine, Ser 0.80; Hemoglobin 12.8; Platelets 186; Potassium 3.5; Sodium 141    Lipid Panel    Component Value Date/Time   CHOL 171 08/08/2021 1320   TRIG 100.0 08/08/2021 1320   HDL 53.30 08/08/2021 1320   CHOLHDL 3 08/08/2021 1320   VLDL 20.0 08/08/2021 1320   LDLCALC 97 08/08/2021 1320   LDLDIRECT 135.0 08/16/2009 1359      Wt Readings from Last 3 Encounters:  12/05/21 122 lb (55.3 kg)  11/14/21 122 lb (55.3 kg)  11/07/21 124 lb (56.2 kg)      No flowsheet data found.    ASSESSMENT AND PLAN:  1.   Mitral valve prolapse with severe mitral regurgitation: I suspect that this is the culprit for the patient's exertional dyspnea.  Fortunately, she does not have significant coronary artery disease.  I referred her to Dr. Burt Knack for evaluation of mitral valve clip if a candidate.  Discussed other options including surgical repair and minimally invasive approach if available.  She prefers the least invasive approach.  2.  Orthostatic hypotension: Her symptoms seem to be improved compared to before.  3.  Essential hypertension: Blood pressure is controlled on small dose amlodipine.  4.  Parkinson's disease: Neurologic symptoms seem to be stable.    Disposition: She is scheduled to see Dr. Burt Knack on January 28.  Follow-up  with me in 4 months.  Signed,  [name], MD  12/05/2021 4:01 PM    [facility]

## 2021-12-05 NOTE — Patient Instructions (Signed)
Medication Instructions:  °Your physician recommends that you continue on your current medications as directed. Please refer to the Current Medication list given to you today. ° °*If you need a refill on your cardiac medications before your next appointment, please call your pharmacy* ° ° °Lab Work: °None ordered °If you have labs (blood work) drawn today and your tests are completely normal, you will receive your results only by: °MyChart Message (if you have MyChart) OR °A paper copy in the mail °If you have any lab test that is abnormal or we need to change your treatment, we will call you to review the results. ° ° °Testing/Procedures: °None ordered ° ° °Follow-Up: °At [facility], you and your health needs are our priority.  As part of our continuing mission to provide you with exceptional heart care, we have created designated Provider Care Teams.  These Care Teams include your primary Cardiologist (physician) and Advanced Practice Providers (APPs -  Physician Assistants and Nurse Practitioners) who all work together to provide you with the care you need, when you need it. ° °We recommend signing up for the patient portal called "MyChart".  Sign up information is provided on this After Visit Summary.  MyChart is used to connect with patients for Virtual Visits (Telemedicine).  Patients are able to view lab/test results, encounter notes, upcoming appointments, etc.  Non-urgent messages can be sent to your provider as well.   °To learn more about what you can do with MyChart, go to [email or web address].   ° °Your next appointment:   °4 month(s) ° °The format for your next appointment:   °In Person ° °Provider:   °You may see Muhammad Arida, MD or one of the following Advanced Practice Providers on your designated Care Team:   °Christopher Berge, NP °Ryan Dunn, PA-C °Cadence Furth, PA-C{ ° ° ° ° °Other Instructions °N/A ° °

## 2021-12-10 ENCOUNTER — Encounter: Payer: Self-pay | Admitting: Family Medicine

## 2021-12-11 ENCOUNTER — Other Ambulatory Visit: Payer: Self-pay | Admitting: Neurology

## 2021-12-11 DIAGNOSIS — G20 Parkinson's disease: Secondary | ICD-10-CM

## 2021-12-15 ENCOUNTER — Ambulatory Visit: Payer: Medicare PPO | Admitting: Cardiovascular Disease

## 2021-12-15 ENCOUNTER — Encounter: Payer: Self-pay | Admitting: Cardiovascular Disease

## 2021-12-15 ENCOUNTER — Other Ambulatory Visit: Payer: Self-pay

## 2021-12-15 VITALS — BP 130/94 | HR 66 | Ht 62.0 in | Wt 123.6 lb

## 2021-12-15 DIAGNOSIS — I059 Rheumatic mitral valve disease, unspecified: Secondary | ICD-10-CM | POA: Diagnosis not present

## 2021-12-15 DIAGNOSIS — Z01810 Encounter for preprocedural cardiovascular examination: Secondary | ICD-10-CM

## 2021-12-15 NOTE — Patient Instructions (Signed)
Medication Instructions:  Your physician recommends that you continue on your current medications as directed. Please refer to the Current Medication list given to you today.  *If you need a refill on your cardiac medications before your next appointment, please call your pharmacy*   Lab Work: CBC, BMET If you have labs (blood work) drawn today and your tests are completely normal, you will receive your results only by: [place] (if you have MyChart) OR A paper copy in the mail If you have any lab test that is abnormal or we need to change your treatment, we will call you to review the results.   Testing/Procedures: TEE Your physician has requested that you have a TEE. During a TEE, sound waves are used to create images of your heart. It provides your doctor with information about the size and shape of your heart and how well your hearts chambers and valves are working. In this test, a transducer is attached to the end of a flexible tube thats guided down your throat and into your esophagus (the tube leading from you mouth to your stomach) to get a more detailed image of your heart. You are not awake for the procedure. Please see the instruction sheet given to you today. For further information please visit [email or web address].   Follow-Up: Structural team will follow up   Provider:   Sherren Mocha

## 2021-12-15 NOTE — H&P (View-Only) (Signed)
[facility]   MULTIDISCIPLINARY HEART VALVE TEAM  Date:  12/21/2021   ID:  Crystal Bass, DOB 1941/01/05, MRN [id number]  PCP:  [name], MD   Chief Complaint  Patient presents with   Mitral Regurgitation     HISTORY OF PRESENT ILLNESS: Crystal Bass is a 81 y.o. female who presents for evaluation of mitral regurgitation, referred by Dr Fletcher Anon. She has a history of Parkinson's disease complicated by orthostatic hypotension, necessitating discontinuation of her antihypertensive medications.   The patient is here with her brother today. She lives in the independent living portion of [place]. She was diagnosed with Parkinson's disease in 2015. Other comorbid conditions include history of [name] infection and chronic lung disease with no history of smoking and remote mastectomy for treatment of breast cancer.   She reports progressive shortness of breath over the past few years. She and her brother both indicate that this is a progressive problem. She has orthopnea for a few minutes of lying down but this 'settles down' and she is able to sleep.  At night feeling like he cannot catch her breath. No PND. No significant leg or abdominal swelling. Because of worsening dyspnea and concern for severe MR on her echo, cardiac cath was performed in December 2020. This showed normal coronary arteries by coronary angiography and hemodynamics demonstrated large V waves of 34 mmHg consistent with severe MR, mean PA pressure of 35 mmHg.    Past Medical History:  Diagnosis Date   Aneurysm of aorta ([place])    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   Aortic root aneurysm    Arthritis    Asthma    Breast cancer ([place]) 2002   infiltrative ductal carcinoma    Cataract 2019   COPD (chronic obstructive pulmonary disease) ([place]) 07/2007   Ductal carcinoma of breast, estrogen receptor positive, stage 1 ([place]) 09/16/2012   Endometriosis    GERD (gastroesophageal reflux disease)     Hypercalcemia 09/14/2013   Hypertension    Lesion of breast 1992   right, benign   Lung disease    secondary to [name] infection   Mastalgia 05/1994   Osteoporosis, post-menopausal 03/14/2012   Ovarian tumor of borderline malignancy, right 2004   Pancreatitis    secondary to Cholelithiasis   Parkinson disease ([place]) 02/10/2015   Post-thoracotomy pain syndrome    Pseudomonas pneumonia ([place]) 03/2008   Traumatic closed fracture of distal clavicle with minimal displacement, left, initial encounter 06/2021   [facility]   Uterine fibroid    Vitamin D deficiency     Current Outpatient Medications  Medication Sig Dispense Refill   acetaminophen (TYLENOL) 500 MG tablet Take 2 tablets (1,000 mg total) by mouth 2 (two) times daily as needed. (Patient taking differently: Take 1,000 mg by mouth 2 (two) times daily as needed for mild pain or moderate pain.)     albuterol (VENTOLIN HFA) 108 (90 Base) MCG/ACT inhaler Inhale 2 puffs into the lungs every 6 (six) hours as needed for wheezing or shortness of breath. 18 g 1   amLODipine (NORVASC) 2.5 MG tablet TAKE 1 TABLET BY MOUTH ONCE A DAY 30 tablet 5   aspirin 81 MG tablet Take 1 tablet (81 mg total) by mouth daily. 30 tablet    Carbidopa-Levodopa ER (SINEMET CR) 25-100 MG tablet controlled release TAKE 2 TABLET BY MOUTH AT 9 AM, 1 TAB AT1 PM AND 1 TAB AT 5 PM 360 tablet 1   clonazePAM (KLONOPIN) 0.5  MG tablet TAKE 1/2 A TABLET BY MOUTH AT BEDTIME 45 tablet 1   EPINEPHrine 0.3 mg/0.3 mL IJ SOAJ injection Inject 0.3 mg into the muscle as needed for anaphylaxis.     fexofenadine (ALLEGRA) 180 MG tablet Take 180 mg by mouth daily.     gabapentin ([name]) 300 MG capsule TAKE 2 CAPSULES BY MOUTH 3 TIMES DAILY 540 capsule 1   omeprazole (PRILOSEC) 20 MG capsule Take 1 capsule (20 mg total) by mouth daily. 30 capsule 11   OVER THE COUNTER MEDICATION Apply 1 application topically daily as needed (congestion). [place] ointment     TRELEGY ELLIPTA  100-62.5-25 MCG/INH AEPB INHALE 1 PUFF BY MOUTH ONCE A DAY 60 each 5   No current facility-administered medications for this visit.    ALLERGIES:   Sulfa antibiotics, Sulfonamide derivatives, Topamax [topiramate], Biaxin [clarithromycin], Hydrocodone, Motrin [ibuprofen], Symbicort [budesonide-formoterol fumarate], Percocet [oxycodone-acetaminophen], Tape, and Tetracyclines & related   SOCIAL HISTORY:  The patient  reports that she has never smoked. She has never used smokeless tobacco. She reports that she does not drink alcohol and does not use drugs.   FAMILY HISTORY:  The patient's family history includes Breast cancer in her cousin; Breast cancer (age of onset: 10) in her sister; Cancer in her maternal grandfather, maternal grandmother, maternal uncle, maternal uncle, and maternal uncle; Colon cancer in her sister; Colon cancer (age of onset: 31) in her cousin; Emphysema in her mother; Liver cancer in her maternal aunt; Lung cancer (age of onset: 48) in her cousin; Lung cancer (age of onset: 33) in her father; Melanoma in her maternal aunt; Myasthenia gravis in her paternal aunt; Osteoporosis in her paternal aunt; Parkinson's disease in her cousin; Stroke in her paternal aunt.   REVIEW OF SYSTEMS:  Positive for fatigue.   All other systems are reviewed and negative.   PHYSICAL EXAM: VS:  BP (!) 130/94    Pulse 66    Ht 5\' 2"  (1.575 m)    Wt 123 lb 9.6 oz (56.1 kg)    SpO2 96%    BMI 22.61 kg/m  , BMI Body mass index is 22.61 kg/m. GEN: Well nourished, well developed, in no acute distress HEENT: normal Neck: No JVD. carotids 2+ without bruits or masses Cardiac: The heart is RRR with a 3/6 holosystolic murmur at the apex. No edema. Pedal pulses 2+ = bilaterally  Respiratory:  clear to auscultation bilaterally GI: soft, nontender, nondistended, + BS MS: no deformity or atrophy Skin: warm and dry, no rash Neuro:  Strength and sensation are intact Psych: euthymic mood, full affect  RECENT  LABS: 08/08/2021: ALT 2 12/19/2021: BUN 15; Creatinine, Ser 0.94; Hemoglobin 12.5; Platelets 155; Potassium 4.3; Sodium 140  08/08/2021: Cholesterol 171; HDL 53.30; LDL Cholesterol 97; Total CHOL/HDL Ratio 3; Triglycerides 100.0; VLDL 20.0   Estimated Creatinine Clearance: 39 mL/min (by C-G formula based on SCr of 0.94 mg/dL).   Wt Readings from Last 3 Encounters:  12/15/21 123 lb 9.6 oz (56.1 kg)  12/05/21 122 lb (55.3 kg)  11/14/21 122 lb (55.3 kg)     CARDIAC STUDIES: Cardiac Cath:    The left ventricular systolic function is normal.   LV end diastolic pressure is mildly elevated.   The left ventricular ejection fraction is 55-65% by visual estimate.   1.  Normal coronary arteries. 2.  Normal left ventricular systolic function. 3.  Moderate to severe mitral regurgitation by left ventricular angiography and severe by hemodynamic evaluation 4.  Right heart catheterization  showed mildly elevated filling pressures, moderate pulmonary hypertension and normal cardiac output.  Giant V waves on PCW consistent with severe mitral regurgitation.   RA: 10 / 7 mmHg RV: 42/4 mmHg PA: 47/21 with a mean of 35 mmHg PCW: A wave is 19 mmHg, V wave is 34 mmHg and mean is 21 mmHg Cardiac output is 4.68 with a cardiac index of 3.03.   Recommendations: Recommend referral to the valve clinic for evaluation of mitral valve repair.  Due to Parkinson's disease and reduced functional capacity, she might not be an ideal candidate for surgical repair.  Echo 09/19/21: IMPRESSIONS     1. Left ventricular ejection fraction, by estimation, is 60 to 65%. The  left ventricle has normal function. The left ventricle has no regional  wall motion abnormalities. There is moderate concentric left ventricular  hypertrophy. Left ventricular  diastolic parameters are consistent with Grade II diastolic dysfunction  (pseudonormalization).   2. Right ventricular systolic function is normal. The right ventricular  size  is normal.   3. Left atrial size was severely dilated.   4. A small pericardial effusion is present.   5. The mitral valve is normal in structure. Moderate to severe mitral  valve regurgitation. No evidence of mitral stenosis. There is moderate  late systolic prolapse of multiple segments of the anterior leaflet of the  mitral valve.   6. The aortic valve is normal in structure. Aortic valve regurgitation is  not visualized. No aortic stenosis is present.   7. There is mild dilatation of the ascending aorta, measuring 40 mm.   Comparison(s): Previous echo reported MR as moderate.   ASSESSMENT AND PLAN: 81 yo woman with Parkinson's disease here for evaluation of severe, symptomatic mitral regurgitation with anterior mitral valve prolapse and NYHA 3 symptoms of fatigue of exertional dyspnea. The patient's 2D echo images are reviewed and demonstrate myxomatous degeneration of the anterior leaflet of the mitral valve with severe prolapse. It is difficult to know if there is a partially flail segment. Cardiac cath hemodynamics are highly suggestive of severe MR.   I have reviewed the natural history of mitral regurgitation with the patient and their family members who are present today. We have discussed the limitations of medical therapy and the poor prognosis associated with symptomatic mitral regurgitation. We have also reviewed potential treatment options, including palliative medical therapy, conventional surgical mitral valve repair or replacement, and percutaneous mitral valve therapies such as edge-to-edge mitral valve approximation with MitraClip. We discussed treatment options in the context of this patient's specific comorbid medical conditions.   The patient is counseled specifically about the risks, indications, and alternatives to percutaneous mitral valve repair with MitraClip. Specific risks include vascular injury, bleeding, infection, arrhythmia, myocardial infarction, stroke, cardiac  perforation, cardiac tamponade, device embolization, single leaflet detachment, endocarditis, mitral valve injury, emergency surgery, and death. They understand the risk of serious complication occurs at a rate of approximately 2%. All of the patient's questions are answered. She understands that she will need to undergo repeat TEE to assess the functional anatomy of her mitral valve and whether the valve has anatomic features suitable for transcatheter edge-to-edge repair. I reviewed risks, indications, and alternatives to TEE. She understands that sore throat is common but serious complications like esophageal injury are extremely rare. She would like to proceed. Once her TEE is completed, she will be referred for formal cardiac surgical consultation as part of a multidisciplinary approach to her care.   Deatra James 12/21/2021 3:44  PM     [place] [address]  [phone] (office) [phone] (fax)

## 2021-12-15 NOTE — Progress Notes (Addendum)
[facility]   MULTIDISCIPLINARY HEART VALVE TEAM  Date:  12/21/2021   ID:  Crystal Bass, DOB 11/05/1941, MRN [id number]  PCP:  [name], MD   Chief Complaint  Patient presents with   Mitral Regurgitation     HISTORY OF PRESENT ILLNESS: Crystal Bass is a 81 y.o. female who presents for evaluation of mitral regurgitation, referred by Dr Fletcher Anon. She has a history of Parkinson's disease complicated by orthostatic hypotension, necessitating discontinuation of her antihypertensive medications.   The patient is here with her brother today. She lives in the independent living portion of [place]. She was diagnosed with Parkinson's disease in 2015. Other comorbid conditions include history of [name] infection and chronic lung disease with no history of smoking and remote mastectomy for treatment of breast cancer.   She reports progressive shortness of breath over the past few years. She and her brother both indicate that this is a progressive problem. She has orthopnea for a few minutes of lying down but this 'settles down' and she is able to sleep.  At night feeling like he cannot catch her breath. No PND. No significant leg or abdominal swelling. Because of worsening dyspnea and concern for severe MR on her echo, cardiac cath was performed in December 2020. This showed normal coronary arteries by coronary angiography and hemodynamics demonstrated large V waves of 34 mmHg consistent with severe MR, mean PA pressure of 35 mmHg.    Past Medical History:  Diagnosis Date   Aneurysm of aorta ([place])    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   Aortic root aneurysm    Arthritis    Asthma    Breast cancer ([place]) 2002   infiltrative ductal carcinoma    Cataract 2019   COPD (chronic obstructive pulmonary disease) ([place]) 07/2007   Ductal carcinoma of breast, estrogen receptor positive, stage 1 ([place]) 09/16/2012   Endometriosis    GERD (gastroesophageal reflux disease)     Hypercalcemia 09/14/2013   Hypertension    Lesion of breast 1992   right, benign   Lung disease    secondary to [name] infection   Mastalgia 05/1994   Osteoporosis, post-menopausal 03/14/2012   Ovarian tumor of borderline malignancy, right 2004   Pancreatitis    secondary to Cholelithiasis   Parkinson disease ([place]) 02/10/2015   Post-thoracotomy pain syndrome    Pseudomonas pneumonia ([place]) 03/2008   Traumatic closed fracture of distal clavicle with minimal displacement, left, initial encounter 06/2021   [facility]   Uterine fibroid    Vitamin D deficiency     Current Outpatient Medications  Medication Sig Dispense Refill   acetaminophen (TYLENOL) 500 MG tablet Take 2 tablets (1,000 mg total) by mouth 2 (two) times daily as needed. (Patient taking differently: Take 1,000 mg by mouth 2 (two) times daily as needed for mild pain or moderate pain.)     albuterol (VENTOLIN HFA) 108 (90 Base) MCG/ACT inhaler Inhale 2 puffs into the lungs every 6 (six) hours as needed for wheezing or shortness of breath. 18 g 1   amLODipine (NORVASC) 2.5 MG tablet TAKE 1 TABLET BY MOUTH ONCE A DAY 30 tablet 5   aspirin 81 MG tablet Take 1 tablet (81 mg total) by mouth daily. 30 tablet    Carbidopa-Levodopa ER (SINEMET CR) 25-100 MG tablet controlled release TAKE 2 TABLET BY MOUTH AT 9 AM, 1 TAB AT1 PM AND 1 TAB AT 5 PM 360 tablet 1   clonazePAM (KLONOPIN) 0.5  MG tablet TAKE 1/2 A TABLET BY MOUTH AT BEDTIME 45 tablet 1   EPINEPHrine 0.3 mg/0.3 mL IJ SOAJ injection Inject 0.3 mg into the muscle as needed for anaphylaxis.     fexofenadine (ALLEGRA) 180 MG tablet Take 180 mg by mouth daily.     gabapentin ([name]) 300 MG capsule TAKE 2 CAPSULES BY MOUTH 3 TIMES DAILY 540 capsule 1   omeprazole (PRILOSEC) 20 MG capsule Take 1 capsule (20 mg total) by mouth daily. 30 capsule 11   OVER THE COUNTER MEDICATION Apply 1 application topically daily as needed (congestion). [place] ointment     TRELEGY ELLIPTA  100-62.5-25 MCG/INH AEPB INHALE 1 PUFF BY MOUTH ONCE A DAY 60 each 5   No current facility-administered medications for this visit.    ALLERGIES:   Sulfa antibiotics, Sulfonamide derivatives, Topamax [topiramate], Biaxin [clarithromycin], Hydrocodone, Motrin [ibuprofen], Symbicort [budesonide-formoterol fumarate], Percocet [oxycodone-acetaminophen], Tape, and Tetracyclines & related   SOCIAL HISTORY:  The patient  reports that she has never smoked. She has never used smokeless tobacco. She reports that she does not drink alcohol and does not use drugs.   FAMILY HISTORY:  The patient's family history includes Breast cancer in her cousin; Breast cancer (age of onset: 86) in her sister; Cancer in her maternal grandfather, maternal grandmother, maternal uncle, maternal uncle, and maternal uncle; Colon cancer in her sister; Colon cancer (age of onset: 43) in her cousin; Emphysema in her mother; Liver cancer in her maternal aunt; Lung cancer (age of onset: 52) in her cousin; Lung cancer (age of onset: 49) in her father; Melanoma in her maternal aunt; Myasthenia gravis in her paternal aunt; Osteoporosis in her paternal aunt; Parkinson's disease in her cousin; Stroke in her paternal aunt.   REVIEW OF SYSTEMS:  Positive for fatigue.   All other systems are reviewed and negative.   PHYSICAL EXAM: VS:  BP (!) 130/94    Pulse 66    Ht 5\' 2"  (1.575 m)    Wt 123 lb 9.6 oz (56.1 kg)    SpO2 96%    BMI 22.61 kg/m  , BMI Body mass index is 22.61 kg/m. GEN: Well nourished, well developed, in no acute distress HEENT: normal Neck: No JVD. carotids 2+ without bruits or masses Cardiac: The heart is RRR with a 3/6 holosystolic murmur at the apex. No edema. Pedal pulses 2+ = bilaterally  Respiratory:  clear to auscultation bilaterally GI: soft, nontender, nondistended, + BS MS: no deformity or atrophy Skin: warm and dry, no rash Neuro:  Strength and sensation are intact Psych: euthymic mood, full affect  RECENT  LABS: 08/08/2021: ALT 2 12/19/2021: BUN 15; Creatinine, Ser 0.94; Hemoglobin 12.5; Platelets 155; Potassium 4.3; Sodium 140  08/08/2021: Cholesterol 171; HDL 53.30; LDL Cholesterol 97; Total CHOL/HDL Ratio 3; Triglycerides 100.0; VLDL 20.0   Estimated Creatinine Clearance: 39 mL/min (by C-G formula based on SCr of 0.94 mg/dL).   Wt Readings from Last 3 Encounters:  12/15/21 123 lb 9.6 oz (56.1 kg)  12/05/21 122 lb (55.3 kg)  11/14/21 122 lb (55.3 kg)     STS Risk calculation:  Isolated MVR Risk of Mortality: 3.647% Renal Failure: 2.573% Permanent Stroke: 2.253% Prolonged Ventilation: 9.214% DSW Infection: 0.040% Reoperation: 7.199% Morbidity or Mortality: 14.802% Short Length of Stay: 21.664% Long Length of Stay: 7.739%  MVR Risk of Mortality: 1.313% Renal Failure: 1.122% Permanent Stroke: 2.107% Prolonged Ventilation: 4.271% DSW Infection: 0.021% Reoperation: 3.496% Morbidity or Mortality: 7.845% Short Length of Stay: 35.673% Long Length of  Stay: 3.229%  CARDIAC STUDIES: Cardiac Cath:    The left ventricular systolic function is normal.   LV end diastolic pressure is mildly elevated.   The left ventricular ejection fraction is 55-65% by visual estimate.   1.  Normal coronary arteries. 2.  Normal left ventricular systolic function. 3.  Moderate to severe mitral regurgitation by left ventricular angiography and severe by hemodynamic evaluation 4.  Right heart catheterization showed mildly elevated filling pressures, moderate pulmonary hypertension and normal cardiac output.  Giant V waves on PCW consistent with severe mitral regurgitation.   RA: 10 / 7 mmHg RV: 42/4 mmHg PA: 47/21 with a mean of 35 mmHg PCW: A wave is 19 mmHg, V wave is 34 mmHg and mean is 21 mmHg Cardiac output is 4.68 with a cardiac index of 3.03.   Recommendations: Recommend referral to the valve clinic for evaluation of mitral valve repair.  Due to Parkinson's disease and  reduced functional capacity, she might not be an ideal candidate for surgical repair.  Echo 09/19/21: IMPRESSIONS     1. Left ventricular ejection fraction, by estimation, is 60 to 65%. The  left ventricle has normal function. The left ventricle has no regional  wall motion abnormalities. There is moderate concentric left ventricular  hypertrophy. Left ventricular  diastolic parameters are consistent with Grade II diastolic dysfunction  (pseudonormalization).   2. Right ventricular systolic function is normal. The right ventricular  size is normal.   3. Left atrial size was severely dilated.   4. A small pericardial effusion is present.   5. The mitral valve is normal in structure. Moderate to severe mitral  valve regurgitation. No evidence of mitral stenosis. There is moderate  late systolic prolapse of multiple segments of the anterior leaflet of the  mitral valve.   6. The aortic valve is normal in structure. Aortic valve regurgitation is  not visualized. No aortic stenosis is present.   7. There is mild dilatation of the ascending aorta, measuring 40 mm.   Comparison(s): Previous echo reported MR as moderate.   ASSESSMENT AND PLAN: 81 yo woman with Parkinson's disease here for evaluation of severe, symptomatic mitral regurgitation with anterior mitral valve prolapse and NYHA 3 symptoms of fatigue of exertional dyspnea. The patient's 2D echo images are reviewed and demonstrate myxomatous degeneration of the anterior leaflet of the mitral valve with severe prolapse. It is difficult to know if there is a partially flail segment. Cardiac cath hemodynamics are highly suggestive of severe MR.   I have reviewed the natural history of mitral regurgitation with the patient and their family members who are present today. We have discussed the limitations of medical therapy and the poor prognosis associated with symptomatic mitral regurgitation. We have also reviewed potential treatment  options, including palliative medical therapy, conventional surgical mitral valve repair or replacement, and percutaneous mitral valve therapies such as edge-to-edge mitral valve approximation with MitraClip. We discussed treatment options in the context of this patient's specific comorbid medical conditions.   The patient is counseled specifically about the risks, indications, and alternatives to percutaneous mitral valve repair with MitraClip. Specific risks include vascular injury, bleeding, infection, arrhythmia, myocardial infarction, stroke, cardiac perforation, cardiac tamponade, device embolization, single leaflet detachment, endocarditis, mitral valve injury, emergency surgery, and death. They understand the risk of serious complication occurs at a rate of approximately 2%. All of the patient's questions are answered. She understands that she will need to undergo repeat TEE to assess the functional anatomy of her  mitral valve and whether the valve has anatomic features suitable for transcatheter edge-to-edge repair. I reviewed risks, indications, and alternatives to TEE. She understands that sore throat is common but serious complications like esophageal injury are extremely rare. She would like to proceed. Once her TEE is completed, she will be referred for formal cardiac surgical consultation as part of a multidisciplinary approach to her care.   Crystal Bass 12/21/2021 3:44 PM     [place]rystal Bass [id number]  [phone] (office) [phone] (fax)

## 2021-12-19 ENCOUNTER — Telehealth: Payer: Self-pay | Admitting: Cardiovascular Disease

## 2021-12-19 ENCOUNTER — Encounter (HOSPITAL_COMMUNITY): Payer: Self-pay | Admitting: Cardiovascular Disease

## 2021-12-19 ENCOUNTER — Other Ambulatory Visit: Payer: Self-pay

## 2021-12-19 ENCOUNTER — Other Ambulatory Visit: Payer: Medicare PPO | Admitting: *Deleted

## 2021-12-19 DIAGNOSIS — Z01810 Encounter for preprocedural cardiovascular examination: Secondary | ICD-10-CM | POA: Diagnosis not present

## 2021-12-19 DIAGNOSIS — I059 Rheumatic mitral valve disease, unspecified: Secondary | ICD-10-CM | POA: Diagnosis not present

## 2021-12-19 LAB — BASIC METABOLIC PANEL
BUN/Creatinine Ratio: 16 (ref 12–28)
BUN: 15 mg/dL (ref 8–27)
CO2: 26 mmol/L (ref 20–29)
Calcium: 9.2 mg/dL (ref 8.7–10.3)
Chloride: 103 mmol/L (ref 96–106)
Creatinine, Ser: 0.94 mg/dL (ref 0.57–1.00)
Glucose: 106 mg/dL — ABNORMAL HIGH (ref 70–99)
Potassium: 4.3 mmol/L (ref 3.5–5.2)
Sodium: 140 mmol/L (ref 134–144)
eGFR: 62 mL/min/{1.73_m2} (ref >59)

## 2021-12-19 LAB — CBC
Hematocrit: 38.3 % (ref 34.0–46.6)
Hemoglobin: 12.5 g/dL (ref 11.1–15.9)
MCH: 30.2 pg (ref 26.6–33.0)
MCHC: 32.6 g/dL (ref 31.5–35.7)
MCV: 93 fL (ref 79–97)
Platelets: 155 10*3/uL (ref 150–450)
RBC: 4.14 x10E6/uL (ref 3.77–5.28)
RDW: 12.7 % (ref 11.7–15.4)
WBC: 5.6 10*3/uL (ref 3.4–10.8)

## 2021-12-19 NOTE — Progress Notes (Signed)
Attempted to obtain medical history via telephone, unable to reach at this time. I left a voicemail to return pre surgical testing department's phone call.  

## 2021-12-19 NOTE — Telephone Encounter (Signed)
Patient wanted to know approximately how long her TEE is going to take. She would like to have an estimate for the person driving her. Please advise

## 2021-12-19 NOTE — Telephone Encounter (Signed)
Spoke with one of the reading MDs and he said pt will be there at least 3 hours.  Called pt and left message to call back.

## 2021-12-20 ENCOUNTER — Other Ambulatory Visit: Payer: Medicare PPO

## 2021-12-21 ENCOUNTER — Encounter: Payer: Self-pay | Admitting: Cardiovascular Disease

## 2021-12-22 NOTE — Telephone Encounter (Signed)
Lm to call back ./cy 

## 2021-12-25 NOTE — Telephone Encounter (Signed)
Called patient and left detailed message (see DPR for permission). Per Marveen Reeks, RN's note:  "Spoke with one of the reading MDs and he said pt will be there at least 3 hours."  Informed pt that the procedure will last approx 30 minutes, but that she'd be there the full 3 hours.

## 2021-12-26 ENCOUNTER — Other Ambulatory Visit: Payer: Self-pay | Admitting: Cardiovascular Disease

## 2021-12-26 DIAGNOSIS — I340 Nonrheumatic mitral (valve) insufficiency: Secondary | ICD-10-CM

## 2021-12-27 ENCOUNTER — Encounter (HOSPITAL_COMMUNITY): Payer: Self-pay | Admitting: Cardiovascular Disease

## 2021-12-27 ENCOUNTER — Ambulatory Visit (HOSPITAL_COMMUNITY)
Admission: RE | Admit: 2021-12-27 | Discharge: 2021-12-27 | Disposition: A | Discharge status: Home / Self Care | Payer: Medicare PPO | Attending: Cardiovascular Disease | Admitting: Cardiovascular Disease

## 2021-12-27 ENCOUNTER — Other Ambulatory Visit: Payer: Self-pay

## 2021-12-27 ENCOUNTER — Ambulatory Visit (HOSPITAL_BASED_OUTPATIENT_CLINIC_OR_DEPARTMENT_OTHER)
Admission: RE | Admit: 2021-12-27 | Discharge: 2021-12-27 | Disposition: A | Discharge status: Home / Self Care | Payer: Medicare PPO | Source: Ambulatory Visit | Attending: Cardiovascular Disease | Admitting: Cardiovascular Disease

## 2021-12-27 ENCOUNTER — Encounter (HOSPITAL_COMMUNITY)
Admission: RE | Disposition: A | Discharge status: Home / Self Care | Payer: Medicare PPO | Source: Home / Self Care | Attending: Cardiovascular Disease

## 2021-12-27 ENCOUNTER — Ambulatory Visit (HOSPITAL_COMMUNITY): Payer: Medicare PPO | Admitting: Anesthesiology

## 2021-12-27 DIAGNOSIS — I509 Heart failure, unspecified: Secondary | ICD-10-CM | POA: Diagnosis not present

## 2021-12-27 DIAGNOSIS — I10 Essential (primary) hypertension: Secondary | ICD-10-CM | POA: Insufficient documentation

## 2021-12-27 DIAGNOSIS — I110 Hypertensive heart disease with heart failure: Secondary | ICD-10-CM | POA: Diagnosis not present

## 2021-12-27 DIAGNOSIS — I088 Other rheumatic multiple valve diseases: Secondary | ICD-10-CM | POA: Diagnosis not present

## 2021-12-27 DIAGNOSIS — I700 Atherosclerosis of aorta: Secondary | ICD-10-CM | POA: Diagnosis not present

## 2021-12-27 DIAGNOSIS — R5383 Other fatigue: Secondary | ICD-10-CM | POA: Insufficient documentation

## 2021-12-27 DIAGNOSIS — G20 Parkinson's disease: Secondary | ICD-10-CM | POA: Diagnosis not present

## 2021-12-27 DIAGNOSIS — R0609 Other forms of dyspnea: Secondary | ICD-10-CM | POA: Insufficient documentation

## 2021-12-27 DIAGNOSIS — J449 Chronic obstructive pulmonary disease, unspecified: Secondary | ICD-10-CM | POA: Diagnosis not present

## 2021-12-27 DIAGNOSIS — I340 Nonrheumatic mitral (valve) insufficiency: Secondary | ICD-10-CM | POA: Diagnosis not present

## 2021-12-27 HISTORY — PX: BUBBLE STUDY: SHX6837

## 2021-12-27 HISTORY — PX: TEE WITHOUT CARDIOVERSION: SHX5443

## 2021-12-27 LAB — ECHO TEE
Area-P 1/2: 3.63 cm2
MV M vel: 5.51 m/s
MV Peak grad: 121.4 mmHg
MV VTI: 2.61 cm2
Radius: 2.1 cm

## 2021-12-27 SURGERY — ECHOCARDIOGRAM, TRANSESOPHAGEAL
Anesthesia: Monitor Anesthesia Care

## 2021-12-27 MED ORDER — PROPOFOL 10 MG/ML IV BOLUS
INTRAVENOUS | Status: DC | PRN
  Administered 2021-12-27 (×3): 20 mg via INTRAVENOUS
  Administered 2021-12-27: 30 mg via INTRAVENOUS

## 2021-12-27 MED ORDER — PROPOFOL 500 MG/50ML IV EMUL
INTRAVENOUS | Status: DC | PRN
  Administered 2021-12-27: 75 ug/kg/min via INTRAVENOUS

## 2021-12-27 MED ORDER — SODIUM CHLORIDE 0.9 % IV SOLN: INTRAVENOUS | Status: DC

## 2021-12-27 NOTE — Progress Notes (Signed)
°  Echocardiogram Echocardiogram Transesophageal has been performed.  Crystal Bass 12/27/2021, 10:50 AM

## 2021-12-27 NOTE — Interval H&P Note (Signed)
History and Physical Interval Note:  12/27/2021 2:26 PM  Crystal Bass  has presented today for surgery, with the diagnosis of MITRAL REGURGITATION.  The various methods of treatment have been discussed with the patient and family. After consideration of risks, benefits and other options for treatment, the patient has consented to  Procedure(s): TRANSESOPHAGEAL ECHOCARDIOGRAM (TEE) (N/A) BUBBLE STUDY as a surgical intervention.  The patient's history has been reviewed, patient examined, no change in status, stable for surgery.  I have reviewed the patient's chart and labs.  Questions were answered to the patient's satisfaction.    NPO for TEE for severe Mr.   [name]. Audie Box, MD, [facility]  [address], [place], [place] [address] [phone]  2:26 PM

## 2021-12-27 NOTE — Transfer of Care (Signed)
Immediate Anesthesia Transfer of Care Note  Patient: Crystal Bass  Procedure(s) Performed: TRANSESOPHAGEAL ECHOCARDIOGRAM (TEE) BUBBLE STUDY  Patient Location: PACU and Endoscopy Unit  Anesthesia Type:MAC  Level of Consciousness: drowsy  Airway & Oxygen Therapy: Patient Spontanous Breathing  Post-op Assessment: Report given to RN and Post -op Vital signs reviewed and stable  Post vital signs: Reviewed and stable  Last Vitals:  Vitals Value Taken Time  BP    Temp    Pulse    Resp    SpO2      Last Pain:  Vitals:   12/27/21 0915  TempSrc: Temporal  PainSc:       Patients Stated Pain Goal: 4 (19/91/44 4584)  Complications: No notable events documented.

## 2021-12-27 NOTE — Discharge Instructions (Signed)

## 2021-12-27 NOTE — CV Procedure (Signed)
° ° °  TRANSESOPHAGEAL ECHOCARDIOGRAM   NAME:  Crystal Bass    MRN: [id number] DOB:  02/01/1941    ADMIT DATE: 12/27/2021  INDICATIONS: Mitral regurgitation  PROCEDURE:   Informed consent was obtained prior to the procedure. The risks, benefits and alternatives for the procedure were discussed and the patient comprehended these risks.  Risks include, but are not limited to, cough, sore throat, vomiting, nausea, somnolence, esophageal and stomach trauma or perforation, bleeding, low blood pressure, aspiration, pneumonia, infection, trauma to the teeth and death.    Procedural time out performed. The oropharynx was anesthetized with topical 1% benzocaine.    Anesthesia was administered by Dr. Doroteo Glassman.  The patient was administered 197 mg of propofol and 0 mg of lidocaine to achieve and maintain moderate conscious sedation.  The patient's heart rate, blood pressure, and oxygen saturation are monitored continuously during the procedure. The period of conscious sedation is 29 minutes, of which I was present face-to-face 100% of this time.   The transesophageal probe was inserted in the esophagus and stomach without difficulty and multiple views were obtained.   COMPLICATIONS:    There were no immediate complications.  KEY FINDINGS:  Severe MR due to A2 prolapse. No flail segment. Severe prolapse noted.  Normal LV/RV function.  Full report to follow. Further management per primary team.   [place] Bells T. Audie Box, MD, [place]  [address] [place], [place] [address] [phone]  10:18 AM

## 2021-12-27 NOTE — Anesthesia Preprocedure Evaluation (Addendum)
Anesthesia Evaluation  Patient identified by MRN, date of birth, ID band Patient awake    Reviewed: Allergy & Precautions, NPO status , Patient's Chart, lab work & pertinent test results  Airway Mallampati: II  TM Distance: >3 FB Neck ROM: Full    Dental no notable dental hx.    Pulmonary asthma , COPD,  COPD inhaler,    Pulmonary exam normal breath sounds clear to auscultation       Cardiovascular hypertension, Pt. on medications +CHF (grade 2 diastolic dysfunction)  Normal cardiovascular exam+ Valvular Problems/Murmurs (mod-severe MR) MR  Rhythm:Regular Rate:Normal  Echo 11/2020: 1. Left ventricular ejection fraction, by estimation, is 60 to 65%. The  left ventricle has normal function. The left ventricle has no regional  wall motion abnormalities. There is moderate concentric left ventricular  hypertrophy. Left ventricular  diastolic parameters are consistent with Grade II diastolic dysfunction  (pseudonormalization).  2. Right ventricular systolic function is normal. The right ventricular  size is normal.  3. Left atrial size was severely dilated.  4. A small pericardial effusion is present.  5. The mitral valve is normal in structure. Moderate to severe mitral  valve regurgitation. No evidence of mitral stenosis. There is moderate  late systolic prolapse of multiple segments of the anterior leaflet of the  mitral valve.  6. The aortic valve is normal in structure. Aortic valve regurgitation is  not visualized. No aortic stenosis is present.  7. There is mild dilatation of the ascending aorta, measuring 40 mm.    Neuro/Psych negative neurological ROS  negative psych ROS   GI/Hepatic Neg liver ROS, GERD  Medicated and Controlled,  Endo/Other  negative endocrine ROS  Renal/GU Renal disease  negative genitourinary   Musculoskeletal  (+) Arthritis , Osteoarthritis,    Abdominal   Peds  Hematology negative  hematology ROS (+) hct 38.3   Anesthesia Other Findings   Reproductive/Obstetrics negative OB ROS                            Anesthesia Physical Anesthesia Plan  ASA: 3  Anesthesia Plan: MAC   Post-op Pain Management:    Induction:   PONV Risk Score and Plan: 2 and Propofol infusion and TIVA  Airway Management Planned: Natural Airway and Simple Face Mask  Additional Equipment: None  Intra-op Plan:   Post-operative Plan:   Informed Consent: I have reviewed the patients History and Physical, chart, labs and discussed the procedure including the risks, benefits and alternatives for the proposed anesthesia with the patient or authorized representative who has indicated his/her understanding and acceptance.       Plan Discussed with: CRNA  Anesthesia Plan Comments:         Anesthesia Quick Evaluation

## 2021-12-27 NOTE — Anesthesia Postprocedure Evaluation (Signed)
Anesthesia Post Note  Patient: LEANORA MURIN  Procedure(s) Performed: TRANSESOPHAGEAL ECHOCARDIOGRAM (TEE) BUBBLE STUDY     Patient location during evaluation: PACU Anesthesia Type: MAC Level of consciousness: awake and alert Pain management: pain level controlled Vital Signs Assessment: post-procedure vital signs reviewed and stable Respiratory status: spontaneous breathing, nonlabored ventilation and respiratory function stable Cardiovascular status: blood pressure returned to baseline and stable Postop Assessment: no apparent nausea or vomiting Anesthetic complications: no   No notable events documented.  Last Vitals:  Vitals:   12/27/21 1034 12/27/21 1045  BP: 134/74 (!) 142/74  Pulse: 70 69  Resp: (!) 26 18  Temp: (!) 36 C (!) 36.3 C  SpO2: 94% 95%    Last Pain:  Vitals:   12/27/21 1045  TempSrc: Temporal  PainSc: 0-No pain                 Pervis Hocking

## 2021-12-29 ENCOUNTER — Other Ambulatory Visit: Payer: Self-pay | Admitting: Family Medicine

## 2021-12-29 ENCOUNTER — Encounter (HOSPITAL_COMMUNITY): Payer: Self-pay | Admitting: Cardiovascular Disease

## 2021-12-30 ENCOUNTER — Encounter: Payer: Self-pay | Admitting: Family Medicine

## 2022-01-01 ENCOUNTER — Telehealth: Payer: Self-pay

## 2022-01-01 NOTE — Telephone Encounter (Signed)
"  For patient BS: This is primary MR   This looks like a clippable valve. The fossa looks approachable for transseptal puncture in the SAXB view. LA dimensions are large enough for device steering and straddle. The MR jet is eccentric and is posteriorly directed. The posterior leaflet measures 1.75 cm in the 135 LVOT grasping view. Gradient measures at 3 mmHg with 67 BPM; MVA measures 6.58 cm2. Based on the central nature of the prolapse, I'd start with an XTW at A2/P2 and assess for gradient. I suspect part of the current gradient is flow related."

## 2022-01-02 ENCOUNTER — Telehealth: Payer: Self-pay

## 2022-01-02 NOTE — Telephone Encounter (Signed)
Discussed the patient's imaging with the Valve Team today and it was determined she has a potentially clippable mitral valve.   Per Dr. Burt Knack, scheduled the patient with him on 2/27 to discuss MitraClip procedure. She understands if they decide together that Clip is a favorable treatment option, her procedure day will be 3/23. Tentatively scheduled her for visit with Dr. Kipp Brood for surgical evaluation 01/26/2022. She was grateful for call and agrees with plan.

## 2022-01-04 NOTE — Telephone Encounter (Signed)
Can we call pt to see if she's interested in changing to gabapentin 600mg  capsules to take 1 tab TID instead of 2?

## 2022-01-04 NOTE — Telephone Encounter (Signed)
Gabapentin Last filled:  10/04/21, #540 Last OV:  08/08/21, AWV prt 2 Next OV:  02/05/22, 6 mo f/u

## 2022-01-05 ENCOUNTER — Other Ambulatory Visit: Payer: Self-pay | Admitting: Family Medicine

## 2022-01-05 NOTE — Telephone Encounter (Signed)
I went ahead and refilled - see below message however.

## 2022-01-09 NOTE — Telephone Encounter (Signed)
Spoke with pt asking about changing gabapentin rx.  Pt agrees to change to 600 mg cap TID.

## 2022-01-10 NOTE — Telephone Encounter (Signed)
Will await updated renal panel prior to sending in 600mg  dose.

## 2022-01-22 ENCOUNTER — Ambulatory Visit: Payer: Medicare PPO | Admitting: Cardiovascular Disease

## 2022-01-22 ENCOUNTER — Other Ambulatory Visit: Payer: Self-pay

## 2022-01-22 ENCOUNTER — Encounter: Payer: Self-pay | Admitting: Cardiovascular Disease

## 2022-01-22 VITALS — BP 120/90 | HR 68 | Ht 62.0 in | Wt 123.4 lb

## 2022-01-22 DIAGNOSIS — I340 Nonrheumatic mitral (valve) insufficiency: Secondary | ICD-10-CM

## 2022-01-22 NOTE — Progress Notes (Signed)
Cardiology Office Note:    Date:  01/23/2022   ID:  ANU STAGNER, DOB 01-09-1941, MRN [id number]  PCP:  [name], MD   [facility] Providers Cardiologist:  [name], MD     Referring MD: [name], MD   Chief Complaint  Patient presents with   Mitral Regurgitation    History of Present Illness:    Crystal Bass is a 81 y.o. female presenting for follow-up of severe nonrheumatic mitral regurgitation.  The patient has significant comorbidities, including Parkinson's disease with orthostatic hypotension.  She is here with her close friend today.  The patient lives independently at [facility] retirement facility in [place].  Other comorbid conditions include [name] infection with chronic lung disease and remote mastectomy for breast cancer.  The patient has undergone cardiac catheterization and transesophageal echo assessment.  She is found to have severe mitral regurgitation with marked prolapse of the A2 scallop of the anterior mitral leaflet and severe associated mitral regurgitation (Barlow valve).  She did not have any clear evidence of a flail leaflet..  There is clear demonstration of severe mitral regurgitation with pulmonary vein flow reversal and large V waves seen at the time of cardiac catheterization.  She did not have any high-grade obstructive CAD.  The patient continues to complain of worsening fatigue and limitation from exertional dyspnea.  She describes orthopnea for the first few minutes of lying down but denies PND or significant leg swelling.  She has no chest pain or pressure.  She is eager to undergo any possible treatment that might improve her quality of life as her symptoms have clearly progressed over the last 6 months.  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   Aortic root aneurysm    Arthritis    Asthma    Breast cancer ([place]) 2002   infiltrative ductal carcinoma     Cataract 2019   COPD (chronic obstructive pulmonary disease) ([place]) 07/2007   Ductal carcinoma of breast, estrogen receptor positive, stage 1 ([place]) 09/16/2012   Endometriosis    GERD (gastroesophageal reflux disease)    Hypercalcemia 09/14/2013   Hypertension    Lesion of breast 1992   right, benign   Lung disease    secondary to [name] infection   Mastalgia 05/1994   Osteoporosis, post-menopausal 03/14/2012   Ovarian tumor of borderline malignancy, right 2004   Pancreatitis    secondary to Cholelithiasis   Parkinson disease ([place]) 02/10/2015   Post-thoracotomy pain syndrome    Pseudomonas pneumonia ([place]) 03/2008   Traumatic closed fracture of distal clavicle with minimal displacement, left, initial encounter 06/2021   [facility]   Uterine fibroid    Vitamin D deficiency     Past Surgical History:  Procedure Laterality Date   ABDOMINAL HYSTERECTOMY     & BSO for Mucinous borderline tumor of R ovary 2004   APPENDECTOMY  2004   BREAST BIOPSY  08/2004   right breast-benign   BREAST LUMPECTOMY  1992   benign   BUBBLE STUDY  12/27/2021   Procedure: BUBBLE STUDY;  Surgeon: Geralynn Rile, MD;  Location: [facility];  Service: Cardiovascular;;   CARDIAC CATHETERIZATION  2007   essentially negation for significant CAD   CHOLECYSTECTOMY  2001   COLONOSCOPY  2014   Dr [name] , due 2019   COLONOSCOPY  09/2018   2 TA removed, int hem, no f/u needed (Dr [name])  ESOPHAGOGASTRODUODENOSCOPY  09/2018   GERD with esophagitis and stricture dilated, antral gastritis negative for H pylori [name])   LUNG BIOPSY  2002   [name], Dr [name]   MASTECTOMY MODIFIED RADICAL Left 2002   oral chemotheraphy (tamoxifen then Armidex) no radiation, Dr.[name]   RIGHT/LEFT HEART CATH AND CORONARY ANGIOGRAPHY Bilateral 11/14/2021   Procedure: RIGHT/LEFT HEART CATH AND CORONARY ANGIOGRAPHY;  Surgeon: Wellington Hampshire, MD;  Location: [facility];  Service: Cardiovascular;  Laterality:  Bilateral;   TEE WITHOUT CARDIOVERSION N/A 12/29/2018   Procedure: TRANSESOPHAGEAL ECHOCARDIOGRAM (TEE);  Surgeon: Wellington Hampshire, MD;  Location: [facility];  Service: Cardiovascular;  Laterality: N/A;   TEE WITHOUT CARDIOVERSION N/A 12/27/2021   Procedure: TRANSESOPHAGEAL ECHOCARDIOGRAM (TEE);  Surgeon: Geralynn Rile, MD;  Location: [facility];  Service: Cardiovascular;  Laterality: N/A;   TONSILLECTOMY  1946    Current Medications: Current Meds  Medication Sig   acetaminophen (TYLENOL) 500 MG tablet Take 2 tablets (1,000 mg total) by mouth 2 (two) times daily as needed. (Patient taking differently: Take 1,000 mg by mouth 2 (two) times daily as needed for mild pain or moderate pain.)   albuterol (VENTOLIN HFA) 108 (90 Base) MCG/ACT inhaler Inhale 2 puffs into the lungs every 6 (six) hours as needed for wheezing or shortness of breath.   amLODipine (NORVASC) 2.5 MG tablet TAKE 1 TABLET BY MOUTH ONCE A DAY   aspirin 81 MG tablet Take 1 tablet (81 mg total) by mouth daily.   Carbidopa-Levodopa ER (SINEMET CR) 25-100 MG tablet controlled release TAKE 2 TABLET BY MOUTH AT 9 AM, 1 TAB AT1 PM AND 1 TAB AT 5 PM   clonazePAM (KLONOPIN) 0.5 MG tablet TAKE 1/2 A TABLET BY MOUTH AT BEDTIME   EPINEPHrine 0.3 mg/0.3 mL IJ SOAJ injection Inject 0.3 mg into the muscle as needed for anaphylaxis.   fexofenadine (ALLEGRA) 180 MG tablet Take 180 mg by mouth daily.   gabapentin ([name]) 300 MG capsule TAKE 2 CAPSULES BY MOUTH 3 TIMES DAILY   omeprazole (PRILOSEC) 20 MG capsule Take 1 capsule (20 mg total) by mouth daily.   OVER THE COUNTER MEDICATION Apply 1 application topically daily as needed (congestion). 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, 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, 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  Tobacco Use   Smoking status: Never   Smokeless tobacco: Never  Vaping Use   Vaping Use: Never used  Substance and Sexual Activity   Alcohol use: No    Alcohol/week: 0.0 standard drinks   Drug use: No   Sexual activity: Not Currently    Birth control/protection: Surgical    Comment: TAH/BSO  Other Topics Concern   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   Aunt of Dr Margaretmary Eddy   Lives independent living at [facility]   No children   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, 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, 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, 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)   Activity: walking about 1 mile/day, enjoys [employer]   Diet:  some water, fruits/vegetables some   Right handed   Social Determinants of Health   Financial Resource Strain: Low Risk    Difficulty of Paying Living Expenses: Not hard at all  Food Insecurity: No Food Insecurity   Worried About Charity fundraiser in the Last Year: Never true   Ran Out of Food in the Last Year: Never true  Transportation Needs: No Transportation Needs   Lack of Transportation (Medical): No   Lack of Transportation (Non-Medical): No  Physical Activity: Inactive   Days of Exercise per Week: 0 days   Minutes of Exercise per Session: 0 min  Stress: No Stress Concern Present   Feeling of Stress : Not at all  Social Connections: Socially Isolated   Frequency of Communication with Friends and Family: More than three times a week   Frequency of Social Gatherings with Friends and Family: Three times a week   Attends Religious Services: Never   Active Member of Clubs or Organizations: No   Attends Archivist Meetings: Never    Marital Status: Widowed     Family History: The patient's family history includes Breast cancer in her cousin; Breast cancer (age of onset: 46) in her sister; Cancer in her maternal grandfather, maternal grandmother, maternal uncle, maternal uncle, and maternal uncle; Colon cancer in her sister; Colon cancer (age of onset: 80) in her cousin; Emphysema in her mother; Liver cancer in her maternal aunt; Lung cancer (age of onset: 24) in her cousin; Lung cancer (age of onset: 91) in her father; Melanoma in her maternal aunt; Myasthenia gravis in her paternal aunt; Osteoporosis in her paternal aunt; Parkinson's disease in her cousin; Stroke in her paternal aunt. There is no history of Diabetes, Heart disease, Stomach cancer, or Ulcerative colitis.  ROS:   Please see the history of present illness.    All other systems reviewed and are negative.  EKGs/Labs/Other Studies Reviewed:    The following studies were reviewed today: TEE: IMPRESSIONS     1. Barlow's mitral valve with bileaflet prolapse. There is severe  prolapse of the A2 segment with incomplete leaflet coaptation with  resultant severe mitral regurgitation. There is no apparent flail segment.  The MR jet is eccentric and posteriorly  directed. There is systolic flow reversal in the L upper/lower pulmonary  veins. Flail gap 6.4 mm. PMVL length 18 mm. MV MG 3.0 mmHG @ 66 bpm. MVA  6.58 cm2. 3D VCA 0.57 cm2. 2D PISA measurements overestimated due to  eccentric jet and not reported. All  findings support severe mitral regurgitation. The mitral valve is  myxomatous. Severe mitral valve regurgitation. No evidence of mitral  stenosis. The mean mitral valve gradient is 3.0 mmHg with average heart  rate of 66 bpm.   2. Left ventricular ejection fraction, by estimation, is 65 to 70%. Left  ventricular ejection fraction by 3D volume is 69 %. The left ventricle has  normal function.   3. Right ventricular systolic function is normal. The right  ventricular  size is normal.   4. Left atrial size was severely dilated. No left atrial/left atrial  appendage thrombus was detected.   5. The aortic valve is tricuspid. Aortic valve regurgitation is not  visualized. No aortic stenosis is present.   6. There is mild (Grade II) layered plaque involving the descending  aorta.   Cardiac Cath:   The left ventricular systolic function is normal.   LV end diastolic pressure is mildly elevated.   The left  ventricular ejection fraction is 55-65% by visual estimate.   1.  Normal coronary arteries. 2.  Normal left ventricular systolic function. 3.  Moderate to severe mitral regurgitation by left ventricular angiography and severe by hemodynamic evaluation 4.  Right heart catheterization showed mildly elevated filling pressures, moderate pulmonary hypertension and normal cardiac output.  Giant V waves on PCW consistent with severe mitral regurgitation.   RA: 10 / 7 mmHg RV: 42/4 mmHg PA: 47/21 with a mean of 35 mmHg PCW: A wave is 19 mmHg, V wave is 34 mmHg and mean is 21 mmHg Cardiac output is 4.68 with a cardiac index of 3.03.   Recommendations: Recommend referral to the valve clinic for evaluation of mitral valve repair.  Due to Parkinson's disease and reduced functional capacity, she might not be an ideal candidate for surgical repair.  EKG:  EKG is not ordered today.    Recent Labs: 08/08/2021: ALT 2 12/19/2021: BUN 15; Creatinine, Ser 0.94; Hemoglobin 12.5; Platelets 155; Potassium 4.3; Sodium 140  Recent Lipid Panel    Component Value Date/Time   CHOL 171 08/08/2021 1320   TRIG 100.0 08/08/2021 1320   HDL 53.30 08/08/2021 1320   CHOLHDL 3 08/08/2021 1320   VLDL 20.0 08/08/2021 1320   LDLCALC 97 08/08/2021 1320   LDLDIRECT 135.0 08/16/2009 1359     Risk Assessment/Calculations:           Physical Exam:    VS:  BP 120/90    Pulse 68    Ht 5\' 2"  (1.575 m)    Wt 123 lb 6.4 oz (56 kg)    SpO2 96%    BMI 22.57 kg/m     Wt  Readings from Last 3 Encounters:  01/22/22 123 lb 6.4 oz (56 kg)  12/27/21 119 lb (54 kg)  12/15/21 123 lb 9.6 oz (56.1 kg)     GEN: Pleasant elderly woman in no acute distress HEENT: Normal NECK: No JVD; No carotid bruits LYMPHATICS: No lymphadenopathy CARDIAC: RRR, 3/6 holosystolic murmur heard at the left lower sternal border into the left scapular region RESPIRATORY:  Clear to auscultation without rales, wheezing or rhonchi  ABDOMEN: Soft, non-tender, non-distended MUSCULOSKELETAL:  No edema; No deformity  SKIN: Warm and dry NEUROLOGIC:  Alert and oriented x 3 PSYCHIATRIC:  Normal affect   ASSESSMENT:    1. Nonrheumatic mitral (valve) insufficiency    PLAN:    In order of problems listed above:  The patient has severe, stage D, nonrheumatic mitral regurgitation with severe prolapse of the anterior mitral valve leaflet myxomatous degeneration and resultant severe posteriorly directed mitral regurgitation.  Symptoms are consistent with New York Heart Association functional class III limitation due to fatigue and exertional dyspnea.  Cardiac catheterization images are reviewed and demonstrate widely patent coronary arteries with no obstructive disease.  There is no evidence of significant mitral annular calcification.  Transesophageal echo demonstrates vigorous LV systolic function with severe prolapse of the A2 segment of the anterior leaflet and resultant severe mitral regurgitation with an eccentric jet that reaches the back valve left atrium demonstration of pulmonary vein flow reversal.  Treatment options are discussed at length with the patient today.  These discussions occur in a shared decision making format, taking into account the patient's age and comorbid medical conditions.  We discussed palliative medical therapy, conventional heart surgery either via sternotomy or minimally invasive approach, and transcatheter therapies such as transcatheter edge-to-edge repair of the mitral  valve.  The patient will be referred for  formal cardiac surgical consultation as part of a multidisciplinary approach to her care.  I suspect with her age and presence of Parkinson's disease, transcatheter edge-to-edge repair of the mitral valve with MitraClip will represent the safest treatment option for her.  This procedure is discussed in detail with the patient today.  A procedural animation is used to demonstrate the procedural steps, reviewed expected outcomes, and discussed potential complications.  The patient understands that potential complications include but are not limited to vascular injury, bleeding, arrhythmia, myocardial infarction, stroke, thrombus formation, device embolization, single leaflet device attachment, infection including endocarditis, cardiac injury, hemopericardium requiring pericardiocentesis or emergency surgery, and death.  She understands the risk of serious complication occurs at a rate of approximately 1 to 2%.  The patient would like to proceed with treatment when she undergoes formal cardiac surgical consultation.  If the multidisciplinary team is in agreement that she is not an appropriate candidate for cardiac surgery, we will plan on transcatheter edge-to-edge repair of the mitral valve at the next available time.           Medication Adjustments/Labs and Tests Ordered: Current medicines are reviewed at length with the patient today.  Concerns regarding medicines are outlined above.  No orders of the defined types were placed in this encounter.  No orders of the defined types were placed in this encounter.   Patient Instructions  Medication Instructions:  NONE *If you need a refill on your cardiac medications before your next appointment, please call your pharmacy*   Lab Work: NONE If you have labs (blood work) drawn today and your tests are completely normal, you will receive your results only by: [place] (if you have MyChart) OR A paper  copy in the mail If you have any lab test that is abnormal or we need to change your treatment, we will call you to review the results.   Testing/Procedures: NONE-keep appt with Dr Kipp Brood   Follow-Up: At [facility], you and your health needs are our priority.  As part of our continuing mission to provide you with exceptional heart care, we have created designated Provider Care Teams.  These Care Teams include your primary Cardiologist (physician) and Advanced Practice Providers (APPs -  Physician Assistants and Nurse Practitioners) who all work together to provide you with the care you need, when you need it.   Provider:   Joetta Manners, MD  01/23/2022 11:47 AM    [place]

## 2022-01-22 NOTE — Patient Instructions (Signed)
Medication Instructions:  NONE *If you need a refill on your cardiac medications before your next appointment, please call your pharmacy*   Lab Work: NONE If you have labs (blood work) drawn today and your tests are completely normal, you will receive your results only by: [place] (if you have MyChart) OR A paper copy in the mail If you have any lab test that is abnormal or we need to change your treatment, we will call you to review the results.   Testing/Procedures: NONE-keep appt with Dr Kipp Brood   Follow-Up: At [facility], you and your health needs are our priority.  As part of our continuing mission to provide you with exceptional heart care, we have created designated Provider Care Teams.  These Care Teams include your primary Cardiologist (physician) and Advanced Practice Providers (APPs -  Physician Assistants and Nurse Practitioners) who all work together to provide you with the care you need, when you need it.   Provider:   Sherren Mocha

## 2022-01-23 ENCOUNTER — Telehealth: Payer: Self-pay

## 2022-01-23 DIAGNOSIS — R0609 Other forms of dyspnea: Secondary | ICD-10-CM

## 2022-01-23 DIAGNOSIS — M810 Age-related osteoporosis without current pathological fracture: Secondary | ICD-10-CM

## 2022-01-23 NOTE — Telephone Encounter (Signed)
Benefits submitted-pending Next injection due 01/24/22 or after PA Reauthorized for dates 11/26/21-11/25/22

## 2022-01-26 ENCOUNTER — Institutional Professional Consult (permissible substitution): Payer: Medicare PPO | Admitting: Thoracic Surgery (Cardiothoracic Vascular Surgery)

## 2022-01-26 ENCOUNTER — Other Ambulatory Visit: Payer: Self-pay

## 2022-01-26 VITALS — BP 97/60 | HR 68 | Resp 20 | Ht 62.0 in | Wt 123.0 lb

## 2022-01-26 DIAGNOSIS — I059 Rheumatic mitral valve disease, unspecified: Secondary | ICD-10-CM | POA: Diagnosis not present

## 2022-02-01 NOTE — Progress Notes (Signed)
[address]       [place] [address]             [phone]        Crystal Bass [place] Medical Record #[id number] Date of Birth: 1941/08/22  Referring: Sherren Mocha, MD Primary Care: [name], MD Primary Cardiologist:Muhammad Fletcher Anon, MD  Chief Complaint:    Chief Complaint  Patient presents with   Mitral Regurgitation    Surgical consult, TEE and Cardiac Cath 01/22/22    History of Present Illness:     81 year old female with severe mitral valve regurgitation, Parkinson's disease, and orthostatic hypotension presents today for surgical evaluation for management of her mitral valve regurgitation.  She is quite symptomatic from this with fatigue and exertional dyspnea.  She also has some orthopnea.  Her symptoms have improved since she has been placed back on Lasix.    Past Medical History:  Diagnosis Date   [place] of 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   Aortic root aneurysm    Arthritis    Asthma    Breast cancer ([place]) 2002   infiltrative ductal carcinoma    Cataract 2019   COPD (chronic obstructive pulmonary disease) ([place]) 07/2007   Ductal carcinoma of breast, estrogen receptor positive, stage 1 ([place]) 09/16/2012   Endometriosis    GERD (gastroesophageal reflux disease)    Hypercalcemia 09/14/2013   Hypertension    Lesion of breast 1992   right, benign   Lung disease    secondary to [name] infection   Mastalgia 05/1994   Osteoporosis, post-menopausal 03/14/2012   Ovarian tumor of borderline malignancy, right 2004   Pancreatitis    secondary to Cholelithiasis   Parkinson disease ([place]) 02/10/2015   Post-thoracotomy pain syndrome    Pseudomonas pneumonia ([place]) 03/2008   Traumatic closed fracture of distal clavicle with minimal displacement, left, initial encounter 06/2021   [facility]   Uterine fibroid    Vitamin D deficiency     Past Surgical History:  Procedure Laterality Date   ABDOMINAL  HYSTERECTOMY     & BSO for Mucinous borderline tumor of R ovary 2004   APPENDECTOMY  2004   BREAST BIOPSY  08/2004   right breast-benign   BREAST LUMPECTOMY  1992   benign   BUBBLE STUDY  12/27/2021   Procedure: BUBBLE STUDY;  Surgeon: Geralynn Rile, MD;  Location: [facility] ENDOSCOPY;  Service: Cardiovascular;;   CARDIAC CATHETERIZATION  2007   essentially negation for significant CAD   CHOLECYSTECTOMY  2001   COLONOSCOPY  2014   Dr [name] , due 2019   COLONOSCOPY  09/2018   2 TA removed, int hem, no f/u needed (Dr [name])    ESOPHAGOGASTRODUODENOSCOPY  09/2018   GERD with esophagitis and stricture dilated, antral gastritis negative for H pylori [name])   LUNG BIOPSY  2002   [name], Dr [name]   MASTECTOMY MODIFIED RADICAL Left 2002   oral chemotheraphy (tamoxifen then Armidex) no radiation, Dr.[name]   RIGHT/LEFT HEART CATH AND CORONARY ANGIOGRAPHY Bilateral 11/14/2021   Procedure: RIGHT/LEFT HEART CATH AND CORONARY ANGIOGRAPHY;  Surgeon: Wellington Hampshire, MD;  Location: [facility];  Service: Cardiovascular;  Laterality: Bilateral;   TEE WITHOUT CARDIOVERSION N/A 12/29/2018   Procedure: TRANSESOPHAGEAL ECHOCARDIOGRAM (TEE);  Surgeon: Wellington Hampshire, MD;  Location: [facility] ORS;  Service: Cardiovascular;  Laterality: N/A;   TEE WITHOUT CARDIOVERSION N/A 12/27/2021   Procedure: TRANSESOPHAGEAL ECHOCARDIOGRAM (TEE);  Surgeon: Geralynn Rile, MD;  Location: [facility];  Service: Cardiovascular;  Laterality: N/A;   TONSILLECTOMY  1946      Social History   Tobacco Use  Smoking Status Never  Smokeless Tobacco Never    Social History   Substance and Sexual Activity  Alcohol Use No   Alcohol/week: 0.0 standard drinks     Allergies  Allergen Reactions   Sulfa Antibiotics Anaphylaxis   Sulfonamide Derivatives Anaphylaxis   Topamax [Topiramate] Other (See Comments)    Metabolic acidosis    Biaxin [Clarithromycin] Other (See Comments)    pericarditis    Hydrocodone Other (See Comments)    "hyper and climbing the walls"   Motrin [Ibuprofen] Other (See Comments)    headaches   Symbicort [Budesonide-Formoterol Fumarate] Other (See Comments)    02/07/15 tremor   Percocet [Oxycodone-Acetaminophen] Itching and Rash   Tape Itching and Rash    Use paper tape only   Tetracyclines & Related Other (See Comments)    "immediate yeast infection"    Current Outpatient Medications  Medication Sig Dispense Refill   acetaminophen (TYLENOL) 500 MG tablet Take 2 tablets (1,000 mg total) by mouth 2 (two) times daily as needed. (Patient taking differently: Take 1,000 mg by mouth 2 (two) times daily as needed for mild pain or moderate pain.)     albuterol (VENTOLIN HFA) 108 (90 Base) MCG/ACT inhaler Inhale 2 puffs into the lungs every 6 (six) hours as needed for wheezing or shortness of breath. 18 g 1   amLODipine (NORVASC) 2.5 MG tablet TAKE 1 TABLET BY MOUTH ONCE A DAY 30 tablet 5   aspirin 81 MG tablet Take 1 tablet (81 mg total) by mouth daily. 30 tablet    Carbidopa-Levodopa ER (SINEMET CR) 25-100 MG tablet controlled release TAKE 2 TABLET BY MOUTH AT 9 AM, 1 TAB AT1 PM AND 1 TAB AT 5 PM 360 tablet 1   clonazePAM (KLONOPIN) 0.5 MG tablet TAKE 1/2 A TABLET BY MOUTH AT BEDTIME 45 tablet 1   EPINEPHrine 0.3 mg/0.3 mL IJ SOAJ injection Inject 0.3 mg into the muscle as needed for anaphylaxis.     fexofenadine (ALLEGRA) 180 MG tablet Take 180 mg by mouth daily.     gabapentin ([name]) 300 MG capsule TAKE 2 CAPSULES BY MOUTH 3 TIMES DAILY 540 capsule 1   omeprazole (PRILOSEC) 20 MG capsule Take 1 capsule (20 mg total) by mouth daily. 30 capsule 11   OVER THE COUNTER MEDICATION Apply 1 application topically daily as needed (congestion). [place] ointment     TRELEGY ELLIPTA 100-62.5-25 MCG/INH AEPB INHALE 1 PUFF BY MOUTH ONCE A DAY 60 each 5   No current facility-administered medications for this visit.    (Not in a hospital admission)   Family History   Problem Relation Age of Onset   Emphysema Mother        d.64 was never a smoker   Lung cancer Father 86       d.82 history of smoking   Breast cancer Sister 60   Colon cancer Sister    Melanoma Maternal Aunt    Liver cancer Maternal Aunt    Cancer Maternal Uncle        unspecified type   Cancer Maternal Uncle        unspecified type   Cancer Maternal Uncle        unspecified type   Stroke Paternal Aunt        in mid 3s   Osteoporosis  Paternal Aunt    Myasthenia gravis Paternal Aunt    Cancer Maternal Grandmother        d.82s unspecified GI cancer   Cancer Maternal Grandfather        d.62s unspecified type   Breast cancer Cousin        d.60s-daughter of unaffected paternal aunt Eustace Moore   Colon cancer Cousin 15       d.70-daughter of unaffected maternal aunt Leila   Lung cancer Cousin 40       d.70-sisters to each other, both daughters of maternal uncle Johnny   Parkinson's disease Cousin    Diabetes Neg Hx    Heart disease Neg Hx    Stomach cancer Neg Hx    Ulcerative colitis Neg Hx      Review of Systems:   Review of Systems  Respiratory:  Positive for shortness of breath. Negative for cough.   Cardiovascular:  Positive for orthopnea and leg swelling. Negative for chest pain.     Physical Exam: BP 97/60 (BP Location: Right Arm, Patient Position: Sitting)    Pulse 68    Resp 20    Ht 5' 2"  (1.575 m)    Wt 123 lb (55.8 kg)    SpO2 94% Comment: RA   BMI 22.50 kg/m  Physical Exam Constitutional:      General: She is not in acute distress.    Appearance: Normal appearance. She is not ill-appearing or toxic-appearing.  Cardiovascular:     Rate and Rhythm: Normal rate.     Heart sounds: Murmur heard.  Pulmonary:     Effort: Pulmonary effort is normal. No respiratory distress.  Abdominal:     General: Abdomen is flat.  Musculoskeletal:     Cervical back: Normal range of motion.  Skin:    General: Skin is warm and dry.  Neurological:     General: No focal deficit  present.     Mental Status: She is alert and oriented to person, place, and time.       I have independently reviewed the above radiologic studies and discussed with the patient   Recent Lab Findings: Lab Results  Component Value Date   WBC 5.6 12/19/2021   HGB 12.5 12/19/2021   HCT 38.3 12/19/2021   PLT 155 12/19/2021   GLUCOSE 106 (H) 12/19/2021   CHOL 171 08/08/2021   TRIG 100.0 08/08/2021   HDL 53.30 08/08/2021   LDLDIRECT 135.0 08/16/2009   LDLCALC 97 08/08/2021   ALT 2 08/08/2021   AST 8 08/08/2021   NA 140 12/19/2021   K 4.3 12/19/2021   CL 103 12/19/2021   CREATININE 0.94 12/19/2021   BUN 15 12/19/2021   CO2 26 12/19/2021   TSH 1.893 10/26/2018   INR 1.00 10/25/2018   HGBA1C 5.3 01/14/2018      Assessment / Plan:   81 year old female with severe mitral valve regurgitation.  She also has a history of Parkinson's with orthostatic hypotension.  Given all of her comorbidities I do think that a minimally invasive approach would be the best option for her.  She is already met with Dr. Burt Knack and they are planning for MitraClip.  I agree with this plan.     I  spent 20 minutes counseling the patient face to face.   Lajuana Matte 02/01/2022 2:33 PM

## 2022-02-03 ENCOUNTER — Other Ambulatory Visit: Payer: Self-pay | Admitting: Internal Medicine

## 2022-02-03 ENCOUNTER — Emergency Department: Payer: Medicare PPO

## 2022-02-03 ENCOUNTER — Encounter: Payer: Self-pay | Admitting: Emergency Medicine

## 2022-02-03 ENCOUNTER — Other Ambulatory Visit: Payer: Self-pay

## 2022-02-03 ENCOUNTER — Emergency Department
Admission: EM | Admit: 2022-02-03 | Discharge: 2022-02-03 | Disposition: A | Discharge status: Home / Self Care | Payer: Medicare PPO | Attending: Emergency Medicine | Admitting: Emergency Medicine

## 2022-02-03 DIAGNOSIS — J189 Pneumonia, unspecified organism: Secondary | ICD-10-CM | POA: Diagnosis not present

## 2022-02-03 DIAGNOSIS — R0689 Other abnormalities of breathing: Secondary | ICD-10-CM | POA: Diagnosis not present

## 2022-02-03 DIAGNOSIS — J90 Pleural effusion, not elsewhere classified: Secondary | ICD-10-CM | POA: Diagnosis not present

## 2022-02-03 DIAGNOSIS — R0789 Other chest pain: Secondary | ICD-10-CM | POA: Diagnosis not present

## 2022-02-03 DIAGNOSIS — I10 Essential (primary) hypertension: Secondary | ICD-10-CM | POA: Diagnosis not present

## 2022-02-03 DIAGNOSIS — J432 Centrilobular emphysema: Secondary | ICD-10-CM | POA: Diagnosis not present

## 2022-02-03 DIAGNOSIS — E860 Dehydration: Secondary | ICD-10-CM | POA: Diagnosis not present

## 2022-02-03 DIAGNOSIS — R918 Other nonspecific abnormal finding of lung field: Secondary | ICD-10-CM | POA: Diagnosis not present

## 2022-02-03 DIAGNOSIS — G20 Parkinson's disease: Secondary | ICD-10-CM | POA: Diagnosis not present

## 2022-02-03 DIAGNOSIS — Z86711 Personal history of pulmonary embolism: Secondary | ICD-10-CM | POA: Diagnosis not present

## 2022-02-03 DIAGNOSIS — J9811 Atelectasis: Secondary | ICD-10-CM | POA: Diagnosis not present

## 2022-02-03 DIAGNOSIS — R079 Chest pain, unspecified: Secondary | ICD-10-CM | POA: Diagnosis not present

## 2022-02-03 DIAGNOSIS — J449 Chronic obstructive pulmonary disease, unspecified: Secondary | ICD-10-CM

## 2022-02-03 LAB — BASIC METABOLIC PANEL
Anion gap: 9 (ref 5–15)
BUN: 17 mg/dL (ref 8–23)
CO2: 24 mmol/L (ref 22–32)
Calcium: 9 mg/dL (ref 8.9–10.3)
Chloride: 104 mmol/L (ref 98–111)
Creatinine, Ser: 0.73 mg/dL (ref 0.44–1.00)
GFR, Estimated: >60 mL/min (ref >60)
Glucose, Bld: 91 mg/dL (ref 70–99)
Potassium: 3.9 mmol/L (ref 3.5–5.1)
Sodium: 137 mmol/L (ref 135–145)

## 2022-02-03 LAB — CBC WITH DIFFERENTIAL/PLATELET
Abs Immature Granulocytes: 0.02 10*3/uL (ref 0.00–0.07)
Basophils Absolute: 0.1 10*3/uL (ref 0.0–0.1)
Basophils Relative: 1 %
Eosinophils Absolute: 0.2 10*3/uL (ref 0.0–0.5)
Eosinophils Relative: 4 %
HCT: 38.4 % (ref 36.0–46.0)
Hemoglobin: 12 g/dL (ref 12.0–15.0)
Immature Granulocytes: 0 %
Lymphocytes Relative: 15 %
Lymphs Abs: 0.9 10*3/uL (ref 0.7–4.0)
MCH: 30 pg (ref 26.0–34.0)
MCHC: 31.3 g/dL (ref 30.0–36.0)
MCV: 96 fL (ref 80.0–100.0)
Monocytes Absolute: 0.5 10*3/uL (ref 0.1–1.0)
Monocytes Relative: 9 %
Neutro Abs: 4.2 10*3/uL (ref 1.7–7.7)
Neutrophils Relative %: 71 %
Platelets: 149 10*3/uL — ABNORMAL LOW (ref 150–400)
RBC: 4 MIL/uL (ref 3.87–5.11)
RDW: 13.9 % (ref 11.5–15.5)
WBC: 6 10*3/uL (ref 4.0–10.5)
nRBC: 0 % (ref 0.0–0.2)

## 2022-02-03 LAB — APTT: aPTT: 32 seconds (ref 24–36)

## 2022-02-03 LAB — PROTIME-INR
INR: 1.1 (ref 0.8–1.2)
Prothrombin Time: 13.8 seconds (ref 11.4–15.2)

## 2022-02-03 LAB — TROPONIN I (HIGH SENSITIVITY)
Troponin I (High Sensitivity): 13 ng/L (ref <18)
Troponin I (High Sensitivity): 14 ng/L (ref <18)

## 2022-02-03 MED ORDER — IOHEXOL 300 MG/ML  SOLN
75.0000 mL | Freq: Once | INTRAMUSCULAR | Status: AC | PRN
  Administered 2022-02-03: 75 mL via INTRAVENOUS

## 2022-02-03 MED ORDER — AMOXICILLIN-POT CLAVULANATE 875-125 MG PO TABS
1.0000 | ORAL_TABLET | Freq: Once | ORAL | Status: AC
  Administered 2022-02-03: 1 via ORAL
  Filled 2022-02-03: qty 1

## 2022-02-03 MED ORDER — AMOXICILLIN-POT CLAVULANATE 875-125 MG PO TABS: 1.0000 | ORAL_TABLET | Freq: Two times a day (BID) | ORAL | 0 refills | Status: AC

## 2022-02-03 NOTE — ED Notes (Signed)
Patient is resting comfortably on stretcher. RR even and unlabored. Provider has just left the room and is planning to discharge the patient. Patient is currently on the phone trying to arrange her own transportation. If she is unable, then staff will do so. Patient currently has no complaints or needs. ?

## 2022-02-03 NOTE — ED Provider Notes (Signed)
? ?[facility] ?Provider Note ? ? ? Event Date/Time  ? First MD Initiated Contact with Patient 02/03/22 1521   ?  (approximate) ? ? ?History  ? ?Chest Pain and Shortness of Breath ? ? ?HPI ? ?Crystal Bass is a 81 y.o. female with a history of Parkinson's disease, pulmonary embolism, [name], mitral valve prolapse who is planned for mitral valve surgery on February 15, 2022 who comes the ED due to an episode of chest pain today. ? ?The patient reports she was at the grocery, shopping when she started having the pain which is described as sharp in the center of her chest radiating to her left shoulder and the left side of her neck.  EMS reported shortness of breath, the patient denies any shortness of breath.  She denies diaphoresis or vomiting.  Not specifically exertional.  Not pleuritic. ? ?She reports the pain lasted about 20 minutes, and then resolved.  She is currently pain-free and feels back to normal.  She states that she has had pain like this in the past, and usually when it happens she takes Tums and rests for a few minutes and it goes away. ? ?  ? ? ?Physical Exam  ? ?Triage Vital Signs: ?ED Triage Vitals  ?Enc Vitals Group  ?   BP 02/03/22 1526 (!) 175/96  ?   Pulse Rate 02/03/22 1526 84  ?   Resp 02/03/22 1526 20  ?   Temp 02/03/22 1526 97.8 ?F (36.6 ?C)  ?   Temp Source 02/03/22 1526 Oral  ?   SpO2 02/03/22 1515 98 %  ?   Weight 02/03/22 1521 134 lb 14.7 oz (61.2 kg)  ?   Height 02/03/22 1521 '5\' 2"'$  (1.575 m)  ?   Head Circumference --   ?   Peak Flow --   ?   Pain Score 02/03/22 1521 1  ?   Pain Loc --   ?   Pain Edu? --   ?   Excl. in [place]? --   ? ? ?Most recent vital signs: ?Vitals:  ? 02/03/22 1526 02/03/22 1830  ?BP: (!) 175/96 (!) 160/99  ?Pulse: 84 78  ?Resp: 20 (!) 23  ?Temp: 97.8 ?F (36.6 ?C)   ?SpO2: 95% 95%  ? ? ? ?General: Awake, no distress.  ?CV:  Good peripheral perfusion.  Regular rate and rhythm.  Blowing systolic murmur at the apex.  Normal peripheral  pulses ?Resp:  Normal effort.  Coarse breath sounds diffusely.  No wheezing. ?Abd:  No distention.  Soft nontender ?Other:  No lower extremity edema or rash. ? ? ?ED Results / Procedures / Treatments  ? ?Labs ?(all labs ordered are listed, but only abnormal results are displayed) ?Labs Reviewed  ?CBC WITH DIFFERENTIAL/PLATELET - Abnormal; Notable for the following components:  ?    Result Value  ? Platelets 149 (*)   ? All other components within normal limits  ?BASIC METABOLIC PANEL  ?PROTIME-INR  ?APTT  ?TROPONIN I (HIGH SENSITIVITY)  ?TROPONIN I (HIGH SENSITIVITY)  ? ? ? ?EKG ? ?Interpreted by me ?Sinus rhythm rate of 87.  Normal axis, normal intervals.  There is LVH.  There is slight ST depression in 2 3 aVF and V4 V5 V6.  No ST elevation.  There are peaked T waves in V2 V3 V4.  This is not significantly changed compared to previous EKG on December 05, 2021. ? ?Repeat EKG at 15:42 interpreted by me ?NSR, rate 70. No  interval change. ? ? ?RADIOLOGY ?Chest x-ray showed clear lung opacity and possible small apical thorax.  Radiology report reviewed, recommending a CT scan of the chest with IV ? ?CT scan shows multifocal faint opacities consistent with atypical pneumonia ? ? ?PROCEDURES: ? ?Critical Care performed: No ? ?.1-3 Lead EKG Interpretation ?Performed by: Carrie Mew, MD ?Authorized by: Carrie Mew, MD  ? ?  Interpretation: normal   ?  ECG rate:  70 ?  ECG rate assessment: normal   ?  Rhythm: sinus rhythm   ?  Ectopy: none   ?  Conduction: normal   ? ? ?MEDICATIONS ORDERED IN ED: ?Medications  ?amoxicillin-clavulanate (AUGMENTIN) 875-125 MG per tablet 1 tablet (has no administration in time range)  ?iohexol (OMNIPAQUE) 300 MG/ML solution 75 mL (75 mLs Intravenous Contrast Given 02/03/22 1746)  ? ? ? ?IMPRESSION / MDM / ASSESSMENT AND PLAN / ED COURSE  ?I reviewed the triage vital signs and the nursing notes. ?             ?               ? ?Differential diagnosis includes, but is not limited to,  GERD, muscular spasm, non-STEMI, pneumothorax, pneumonia ? ?**The patient is on the cardiac monitor to evaluate for evidence of arrhythmia and/or significant heart rate changes.**} ? ?Patient presents with nonspecific chest pain.  She does have some EKG change, and I think overnight telemetry observation would be warranted for serial troponins and further cardiac evaluation.  She does have mitral valve surgery scheduled in 12 days. ? ?----------------------------------------- ?7:24 PM on 02/03/2022 ?----------------------------------------- ?Patient remains asymptomatic.  Vital signs remained stable, oxygen saturation remains 95% and above on room air. ? ?CT scan negative for pneumothorax but does show diffuse tree-in-bud opacities concerning for an atypical pneumonia.  Offered admission for IV antibiotics and continued monitoring, but patient declines.  She is highly motivated to be discharged.  Start Augmentin.  Return precautions discussed.  Encouraged her to follow-up with her pulmonologist and primary care. ? ?  ? ? ?FINAL CLINICAL IMPRESSION(S) / ED DIAGNOSES  ? ?Final diagnoses:  ?Atypical pneumonia  ?Atypical chest pain  ? ? ? ?Rx / DC Orders  ? ?ED Discharge Orders   ? ?      Ordered  ?  amoxicillin-clavulanate (AUGMENTIN) 875-125 MG tablet  2 times daily       ? 02/03/22 1919  ? ?  ?  ? ?  ? ? ? ?Note:  This document was prepared using Dragon voice recognition software and may include unintentional dictation errors. ?  ?Carrie Mew, MD ?02/03/22 1925 ? ?

## 2022-02-03 NOTE — ED Notes (Signed)
Discharge instructions provided to patient with script information and follow-up. Patient verbalized understanding. Patient also took her MOST form and DNR form with her in her 81. Patient inquired about a COVID test prior to discharge but provider stated that one was not necessary and that she would be getting one prior to surgery. This nurse offered to allow her to remain in the room until her ride arrived but she didn't want her ride to be exposed to the ER. This nurse also verified with provider that it was ok to send patient home with the elevated diastolic BP. He stated yes. Patient was taken by wheelchair out to the front lobby.  ?

## 2022-02-03 NOTE — ED Triage Notes (Signed)
Pt coming from [facility] via [facility] [employer]. Per EMS pt c/o of chest pain associated with SOB that started today. Pt describes that pain as a sharp pain in the center of her chest that radiates to her left shoulder and neck.  ? ? ?Pt states she has a mitral valve replacement surgery coming up.  ?

## 2022-02-05 ENCOUNTER — Encounter: Payer: Self-pay | Admitting: Family Medicine

## 2022-02-05 ENCOUNTER — Other Ambulatory Visit: Payer: Self-pay

## 2022-02-05 ENCOUNTER — Ambulatory Visit: Payer: Medicare PPO | Admitting: Family Medicine

## 2022-02-05 ENCOUNTER — Telehealth: Payer: Self-pay | Admitting: Cardiovascular Disease

## 2022-02-05 VITALS — BP 120/72 | HR 75 | Temp 98.1°F | Resp 16 | Ht 62.0 in | Wt 118.6 lb

## 2022-02-05 DIAGNOSIS — R5382 Chronic fatigue, unspecified: Secondary | ICD-10-CM

## 2022-02-05 DIAGNOSIS — G20 Parkinson's disease: Secondary | ICD-10-CM

## 2022-02-05 DIAGNOSIS — I059 Rheumatic mitral valve disease, unspecified: Secondary | ICD-10-CM

## 2022-02-05 DIAGNOSIS — R0609 Other forms of dyspnea: Secondary | ICD-10-CM

## 2022-02-05 DIAGNOSIS — R634 Abnormal weight loss: Secondary | ICD-10-CM | POA: Insufficient documentation

## 2022-02-05 DIAGNOSIS — R0789 Other chest pain: Secondary | ICD-10-CM

## 2022-02-05 DIAGNOSIS — A310 Pulmonary mycobacterial infection: Secondary | ICD-10-CM | POA: Diagnosis not present

## 2022-02-05 DIAGNOSIS — J449 Chronic obstructive pulmonary disease, unspecified: Secondary | ICD-10-CM | POA: Diagnosis not present

## 2022-02-05 NOTE — Progress Notes (Unsigned)
Patient ID: Crystal Bass, female    DOB: 1941-01-15, 81 y.o.   MRN: [id number]  This visit was conducted in person.  BP 120/72    Pulse 75    Temp 98.1 F (36.7 C)    Resp 16    Ht '5\' 2"'$  (1.575 m)    Wt 118 lb 9.6 oz (53.8 kg)    SpO2 97%    BMI 21.69 kg/m    CC: 6 mo f/u visit, ER f/u  Subjective:   HPI: Crystal Bass is a 81 y.o. female presenting on 02/05/2022 for No chief complaint on file.   Upcoming mitral valve repair for severe MR and MVP followed by Dr Burt Knack. She notes worsening exertional dyspnea and fatigue. Currently symptoms are managed medically with furosemide. They anticipate transcatheter edge-to-edge repair of MV with MitraClip.   PD on sinemet CR last saw neurology Dr Tat 09/2021.   Recent ER visit for chest pain over the weekend described as sharp mid chest pain with radiation to left shoulder and left neck. Pain lasted 20 minutes then resolved. Workup including CXR then CT showing multifocal opacities suspicious for atypical pneumonia. She was started on augmentin course and is currently tolerating well. CBC stable, TnI x2 negative.   Episodes of chest pain occurred while in the grocery store. This was exertional and described as sharp pain in mid chest with radiation to left shoulder and neck, not down left arm. No new or worsening dyspnea, no cough, fever, wheezing. No nausea or diaphoresis.   Weight loss noted. She endorses anorexia and fatigue likely attributable to known severe MR/MVP. 12 lb weight loss since 06/2021.   She had a lung biopsy 2002 (Dr [name]) consistent with [name] infection.   Prior CT angio 07/2020 showed progressive bronchiectasis and peripheral consolidation/atelectasis in anterior RUL along with more scattered nodule airspace opacities in both lungs consistent with [name].      Relevant past medical, surgical, family and social history reviewed and updated as indicated. Interim medical history since our last visit reviewed. Allergies  and medications reviewed and updated. Outpatient Medications Prior to Visit  Medication Sig Dispense Refill   acetaminophen (TYLENOL) 500 MG tablet Take 2 tablets (1,000 mg total) by mouth 2 (two) times daily as needed. (Patient taking differently: Take 1,000 mg by mouth 2 (two) times daily as needed for mild pain or moderate pain.)     albuterol (VENTOLIN HFA) 108 (90 Base) MCG/ACT inhaler Inhale 2 puffs into the lungs every 6 (six) hours as needed for wheezing or shortness of breath. 18 g 1   amLODipine (NORVASC) 2.5 MG tablet TAKE 1 TABLET BY MOUTH ONCE A DAY 30 tablet 5   amoxicillin-clavulanate (AUGMENTIN) 875-125 MG tablet Take 1 tablet by mouth 2 (two) times daily for 7 days. 14 tablet 0   aspirin 81 MG tablet Take 1 tablet (81 mg total) by mouth daily. 30 tablet    Carbidopa-Levodopa ER (SINEMET CR) 25-100 MG tablet controlled release TAKE 2 TABLET BY MOUTH AT 9 AM, 1 TAB AT1 PM AND 1 TAB AT 5 PM 360 tablet 1   clonazePAM (KLONOPIN) 0.5 MG tablet TAKE 1/2 A TABLET BY MOUTH AT BEDTIME 45 tablet 1   EPINEPHrine 0.3 mg/0.3 mL IJ SOAJ injection Inject 0.3 mg into the muscle as needed for anaphylaxis.     fexofenadine (ALLEGRA) 180 MG tablet Take 180 mg by mouth daily.     gabapentin ([name]) 300 MG capsule TAKE 2 CAPSULES BY  MOUTH 3 TIMES DAILY 540 capsule 1   omeprazole (PRILOSEC) 20 MG capsule Take 1 capsule (20 mg total) by mouth daily. 30 capsule 11   OVER THE COUNTER MEDICATION Apply 1 application topically daily as needed (congestion). [place] ointment     TRELEGY ELLIPTA 100-62.5-25 MCG/INH AEPB INHALE 1 PUFF BY MOUTH ONCE A DAY 60 each 5   No facility-administered medications prior to visit.     Per HPI unless specifically indicated in ROS section below Review of Systems  Objective:  BP 120/72    Pulse 75    Temp 98.1 F (36.7 C)    Resp 16    Ht '5\' 2"'$  (1.575 m)    Wt 118 lb 9.6 oz (53.8 kg)    SpO2 97%    BMI 21.69 kg/m   Wt Readings from Last 3 Encounters:  02/05/22 118  lb 9.6 oz (53.8 kg)  02/03/22 134 lb 14.7 oz (61.2 kg)  01/26/22 123 lb (55.8 kg)      Physical Exam Vitals and nursing note reviewed.  Constitutional:      Appearance: Normal appearance.     Comments: Tired, frail appearing  Eyes:     Extraocular Movements: Extraocular movements intact.     Pupils: Pupils are equal, round, and reactive to light.  Cardiovascular:     Rate and Rhythm: Normal rate and regular rhythm.     Heart sounds: Murmur (4/6 systolic apex) heard.  Pulmonary:     Effort: Pulmonary effort is normal. No respiratory distress.     Breath sounds: No wheezing, rhonchi or rales.     Comments: Coarse crackles bibasilarly Musculoskeletal:     Right lower leg: No edema.     Left lower leg: No edema.  Skin:    General: Skin is warm and dry.     Findings: No rash.  Neurological:     Mental Status: She is alert.  Psychiatric:        Mood and Affect: Mood normal.        Behavior: Behavior normal.      Results for orders placed or performed during the hospital encounter of 02/40/97  Basic metabolic panel  Result Value Ref Range   Sodium 137 135 - 145 mmol/L   Potassium 3.9 3.5 - 5.1 mmol/L   Chloride 104 98 - 111 mmol/L   CO2 24 22 - 32 mmol/L   Glucose, Bld 91 70 - 99 mg/dL   BUN 17 8 - 23 mg/dL   Creatinine, Ser 0.73 0.44 - 1.00 mg/dL   Calcium 9.0 8.9 - 10.3 mg/dL   GFR, Estimated >60 >60 mL/min   Anion gap 9 5 - 15  CBC with Differential  Result Value Ref Range   WBC 6.0 4.0 - 10.5 K/uL   RBC 4.00 3.87 - 5.11 MIL/uL   Hemoglobin 12.0 12.0 - 15.0 g/dL   HCT 38.4 36.0 - 46.0 %   MCV 96.0 80.0 - 100.0 fL   MCH 30.0 26.0 - 34.0 pg   MCHC 31.3 30.0 - 36.0 g/dL   RDW 13.9 11.5 - 15.5 %   Platelets 149 (L) 150 - 400 K/uL   nRBC 0.0 0.0 - 0.2 %   Neutrophils Relative % 71 %   Neutro Abs 4.2 1.7 - 7.7 K/uL   Lymphocytes Relative 15 %   Lymphs Abs 0.9 0.7 - 4.0 K/uL   Monocytes Relative 9 %   Monocytes Absolute 0.5 0.1 - 1.0 K/uL   Eosinophils Relative  4  %   Eosinophils Absolute 0.2 0.0 - 0.5 K/uL   Basophils Relative 1 %   Basophils Absolute 0.1 0.0 - 0.1 K/uL   Immature Granulocytes 0 %   Abs Immature Granulocytes 0.02 0.00 - 0.07 K/uL  Protime-INR  Result Value Ref Range   Prothrombin Time 13.8 11.4 - 15.2 seconds   INR 1.1 0.8 - 1.2  APTT  Result Value Ref Range   aPTT 32 24 - 36 seconds  Troponin I (High Sensitivity)  Result Value Ref Range   Troponin I (High Sensitivity) 13 <18 ng/L  Troponin I (High Sensitivity)  Result Value Ref Range   Troponin I (High Sensitivity) 14 <18 ng/L    Assessment & Plan:  This visit occurred during the SARS-CoV-2 public health emergency.  Safety protocols were in place, including screening questions prior to the visit, additional usage of staff PPE, and extensive cleaning of exam room while observing appropriate contact time as indicated for disinfecting solutions.   Problem List Items Addressed This Visit     Chronic obstructive airway disease with asthma (HCC)   Chronic dyspnea   Parkinson disease (HCC)   Chronic fatigue   Chest pain - Primary    Recent episode of chest pain s/p ER evaluation where progression of [name] was noted on CT. I think this was incidental finding and less likely cause of current symptoms as she has not had fever or cough. She has chronic dyspnea and fatigue which are also likely attributable to her mitral valve disease. She has upcoming mitral repair and wants to avoid any delays. I will touch base with cardiology about recent symptoms and ER visit and if needed will refer urgently to pulmonology for further evaluation. She agrees with plan.       [name] (mycobacterium avium-intracellulare) infection ([place])    H/o this, s/p lung biopsy 2002. See above.       Mitral valve disease    Known severe MR with MVP planning minimally invasive MV repair with MitraClip through cardiology. This likely contributes to current symptoms. See below.       Weight loss, unintentional     Associated with anorexia likely attributed to severe mitral valve disease. I encouraged her to try boost supplementation as orexigenic.         No orders of the defined types were placed in this encounter.  No orders of the defined types were placed in this encounter.   Patient Instructions  Continue augmentin for now.  I will try to touch base with Dr Burt Knack about your recent ER visit/symptoms.  I recommend you try boost protein supplement 1/2-1 shake 30 minutes before lunch. This will hopefully help boost your appetite.  Good to see you today.   Follow up plan: Return if symptoms worsen or fail to improve.  [name], MD

## 2022-02-05 NOTE — Telephone Encounter (Signed)
Patient's PCP Dr. [name] is calling requesting to speak with Dr. Burt Knack in regards to the patient's upcoming Mitral Valve Repair. Please advise.  ?

## 2022-02-05 NOTE — Assessment & Plan Note (Addendum)
Associated with anorexia likely attributed to severe mitral valve disease. I encouraged her to try boost supplementation as orexigenic.  ?

## 2022-02-05 NOTE — Assessment & Plan Note (Addendum)
Recent episode of chest pain s/p ER evaluation where progression of [name] was noted on CT. I think this was incidental finding and less likely cause of current symptoms as she has not had fever or cough. She has chronic dyspnea and fatigue which are also likely attributable to her mitral valve disease. She has upcoming mitral repair and wants to avoid any delays. I will touch base with cardiology about recent symptoms and ER visit and if needed will refer urgently to pulmonology for further evaluation. She agrees with plan.

## 2022-02-05 NOTE — Assessment & Plan Note (Signed)
Known severe MR with MVP planning minimally invasive MV repair with MitraClip through cardiology. This likely contributes to current symptoms. See below.  ?

## 2022-02-05 NOTE — Assessment & Plan Note (Signed)
H/o this, s/p lung biopsy 2002. See above.  ?

## 2022-02-05 NOTE — Patient Instructions (Signed)
Continue augmentin for now.  ?I will try to touch base with Dr Burt Knack about your recent ER visit/symptoms.  ?I recommend you try boost protein supplement 1/2-1 shake 30 minutes before lunch. This will hopefully help boost your appetite.  ?Good to see you today.  ?

## 2022-02-06 NOTE — Telephone Encounter (Signed)
Recommend hold prolia for 1 month after surgery, this should give her ample time to recover and be able to come into office for injection.  ?

## 2022-02-06 NOTE — Telephone Encounter (Signed)
Benefits received. OOP cost $35. ? ?Dr Darnell Level patient has upcoming mitral valve surgery. Does patient need to wait a certain period of time before coming in for Prolia injection with recent health history? ?

## 2022-02-07 NOTE — Telephone Encounter (Signed)
Noted  

## 2022-02-09 ENCOUNTER — Other Ambulatory Visit: Payer: Self-pay

## 2022-02-09 ENCOUNTER — Ambulatory Visit (INDEPENDENT_AMBULATORY_CARE_PROVIDER_SITE_OTHER): Payer: Medicare PPO

## 2022-02-09 ENCOUNTER — Encounter: Payer: Self-pay | Admitting: Primary Care

## 2022-02-09 ENCOUNTER — Telehealth: Payer: Self-pay | Admitting: Cardiology

## 2022-02-09 ENCOUNTER — Encounter: Payer: Medicare PPO | Admitting: Thoracic Surgery (Cardiothoracic Vascular Surgery)

## 2022-02-09 ENCOUNTER — Ambulatory Visit: Payer: Medicare PPO | Admitting: Primary Care

## 2022-02-09 VITALS — BP 112/68 | HR 75 | Ht 62.0 in | Wt 123.0 lb

## 2022-02-09 DIAGNOSIS — R918 Other nonspecific abnormal finding of lung field: Secondary | ICD-10-CM

## 2022-02-09 DIAGNOSIS — Z01818 Encounter for other preprocedural examination: Secondary | ICD-10-CM

## 2022-02-09 DIAGNOSIS — Z01811 Encounter for preprocedural respiratory examination: Secondary | ICD-10-CM | POA: Diagnosis not present

## 2022-02-09 DIAGNOSIS — J449 Chronic obstructive pulmonary disease, unspecified: Secondary | ICD-10-CM | POA: Diagnosis not present

## 2022-02-09 DIAGNOSIS — J189 Pneumonia, unspecified organism: Secondary | ICD-10-CM

## 2022-02-09 MED ORDER — TRELEGY ELLIPTA 100-62.5-25 MCG/ACT IN AEPB: 1.0000 | INHALATION_SPRAY | Freq: Every day | RESPIRATORY_TRACT | 5 refills | Status: DC

## 2022-02-09 NOTE — Telephone Encounter (Signed)
?  [facility]   ?MULTIDISCIPLINARY HEART VALVE TEAM  ? ?Called the patient to confirm knowledge of pre-admission testing scheduled for 02/12/22 with MitraClip to follow on 02/15/22. All questions answered.  ? ?Kathyrn Drown NP-C ?Structural Heart Team  ?Pager: [phone] ?Phone: [phone] ? ?

## 2022-02-09 NOTE — Progress Notes (Signed)
Surgical Instructions ? ? ? Your procedure is scheduled on Thursday March 23rd. ? Report to [facility] Main Entrance "A" at 5:45 A.M., then check in with the Admitting office. ? Call this number if you have problems the morning of surgery: ? [phone] ? ? If you have any questions prior to your surgery date call [phone]: Open Monday-Friday 8am-4pm ? ? ? Remember: ? Do not eat or drink after midnight the night before your surgery ? ? ?  ? Take these medicines the morning of surgery with A SIP OF WATER:  ?Per your surgeon's orders, You may take your medications as directed (including Aspirin) the day of surgery with a sip of water. ?amLODipine (NORVASC) 2.5 MG tablet ?amoxicillin-clavulanate (AUGMENTIN) 875-125 MG tablet ?aspirin 81 MG tablet ?Carbidopa-Levodopa ER (SINEMET CR) 25-100 MG tablet controlled release ?fexofenadine (ALLEGRA) 180 MG tablet ?gabapentin ([name]) 300 MG capsule ?omeprazole (PRILOSEC) 20 MG capsule ? ?IF NEEDED: ?acetaminophen (TYLENOL) 500 MG tablet ?albuterol (VENTOLIN HFA) 108 (90 Base) MCG/ACT inhaler - may bring with you to the hospital ?EPINEPHrine 0.3 mg/0.3 mL IJ SOAJ injection ?TRELEGY ELLIPTA 100-62.5-25 MCG/INH AEPB - may bring with you to the hospital ? ? ?As of today, STOP taking any Aleve, Naproxen, Ibuprofen, Motrin, Advil, Goody's, BC's, all herbal medications, fish oil, and all vitamins. ? ?         ?Do not wear jewelry or makeup ?Do not wear lotions, powders, perfumes, or deodorant. ?Do not shave 48 hours prior to surgery.   ?Do not bring valuables to the hospital. ?Do not wear nail polish, gel polish, artificial nails, or any other type of covering on natural nails (fingers and toes) ?If you have artificial nails or gel coating that need to be removed by a nail salon, please have this removed prior to surgery. Artificial nails or gel coating may interfere with anesthesia's ability to adequately monitor your vital signs. ? ?[facility] is not responsible for any  belongings or valuables. .  ? ?Do NOT Smoke (Tobacco/Vaping)  24 hours prior to your procedure ? ?If you use a CPAP at night, you may bring your mask for your overnight stay. ?  ?Contacts, glasses, hearing aids, dentures or partials may not be worn into surgery, please bring cases for these belongings ?  ?For patients admitted to the hospital, discharge time will be determined by your treatment team. ?  ?Patients discharged the day of surgery will not be allowed to drive home, and someone needs to stay with them for 24 hours. ? ?NO VISITORS WILL BE ALLOWED IN PRE-OP WHERE PATIENTS ARE PREPPED FOR SURGERY.  ONLY 1 SUPPORT PERSON MAY BE PRESENT IN THE WAITING ROOM WHILE YOU ARE IN SURGERY.  IF YOU ARE TO BE ADMITTED, ONCE YOU ARE IN YOUR ROOM YOU WILL BE ALLOWED TWO (2) VISITORS. 1 (ONE) VISITOR MAY STAY OVERNIGHT BUT MUST ARRIVE TO THE ROOM BY 8pm.  Minor children may have two parents present. Special consideration for safety and communication needs will be reviewed on a case by case basis. ? ?Special instructions:   ? ?Oral Hygiene is also important to reduce your risk of infection.  Remember - BRUSH YOUR TEETH THE MORNING OF SURGERY WITH YOUR REGULAR TOOTHPASTE ? ? ?[facility]- Preparing For Surgery ? ?Before surgery, you can play an important role. Because skin is not sterile, your skin needs to be as free of germs as possible. You can reduce the number of germs on your skin by washing with CHG (chlorahexidine gluconate)  Soap before surgery.  CHG is an antiseptic cleaner which kills germs and bonds with the skin to continue killing germs even after washing.   ? ? ?Please do not use if you have an allergy to CHG or antibacterial soaps. If your skin becomes reddened/irritated stop using the CHG.  ?Do not shave (including legs and underarms) for at least 48 hours prior to first CHG shower. It is OK to shave your face. ? ?Please follow these instructions carefully. ?  ? ? Shower the NIGHT BEFORE SURGERY and the  MORNING OF SURGERY with CHG Soap.  ? If you chose to wash your hair, wash your hair first as usual with your normal shampoo. After you shampoo, rinse your hair and body thoroughly to remove the shampoo.  Then [employer] and genitals (private parts) with your normal soap and rinse thoroughly to remove soap. ? ?After that Use CHG Soap as you would any other liquid soap. You can apply CHG directly to the skin and wash gently with a scrungie or a clean washcloth.  ? ?Apply the CHG Soap to your body ONLY FROM THE NECK DOWN.  Do not use on open wounds or open sores. Avoid contact with your eyes, ears, mouth and genitals (private parts). Wash Face and genitals (private parts)  with your normal soap.  ? ?Wash thoroughly, paying special attention to the area where your surgery will be performed. ? ?Thoroughly rinse your body with warm water from the neck down. ? ?DO NOT shower/wash with your normal soap after using and rinsing off the CHG Soap. ? ?Pat yourself dry with a CLEAN TOWEL. ? ?Wear CLEAN PAJAMAS to bed the night before surgery ? ?Place CLEAN SHEETS on your bed the night before your surgery ? ?DO NOT SLEEP WITH PETS. ? ? ?Day of Surgery: ? ?Take a shower with CHG soap. ?Wear Clean/Comfortable clothing the morning of surgery ?Do not apply any deodorants/lotions.   ?Remember to brush your teeth WITH YOUR REGULAR TOOTHPASTE. ? ? ? ?COVID testing ? ?If you are going to stay overnight or be admitted after your procedure/surgery and require a pre-op COVID test, please follow these instructions after your COVID test  ? ?You are not required to quarantine however you are required to wear a well-fitting mask when you are out and around people not in your household.  If your mask becomes wet or soiled, replace with a new one. ? ?Wash your hands often with soap and water for 20 seconds or clean your hands with an alcohol-based hand sanitizer that contains at least 60% alcohol. ? ?Do not share personal items. ? ?Notify your  provider: ?if you are in close contact with someone who has COVID  ?or if you develop a fever of 100.4 or greater, sneezing, cough, sore throat, shortness of breath or body aches. ? ?  ?Please read over the following fact sheets that you were given.  ? ?

## 2022-02-09 NOTE — Progress Notes (Signed)
? ?'@Patient'$  ID: Crystal Bass, female    DOB: [birth year], 81 y.o.   MRN: [id number] ? ?Chief Complaint  ?Patient presents with  ? Follow-up  ? ? ?Referring provider: ?[name], MD ? ?HPI: ?81 year old female, never smoked. PMH significant for COPD with asthma, chronic dyspnea, chronic fatigue, [name], allergic rhinitis, HTN, aortic aneurysm, PE, breast cancer, dysphagia, parkinson disease. Patient of Dr. Chase Caller. ? ?[facility] pulmonary encounters: ?03/25/2020 ?Patient presents today for acute visit to evaluate dyspnea. She was at Dr. Sanjuan Dame office today and noted to seem more short of breath. Patient reports that she feels well and does not feel that her breathing is significantly worse than her baseline. States that it is not unusual for her to become short of breath with walking or with warm weather. She is using Trelegy Ellipta inhaler as prescribed. She has albuterol at home but believes it is out of date. She is unsure of when to use it. States that she tried to get by without using it. She notes some right sided pleuritic pain. She had some mucus production last week but reports no color. Some associated chest/upperairway tightness. She denies current cough or wheezing. She recently just stopped taking allergy shots.  ? ?04/08/2020 ?Patient presents today for 2 week follow-up. Feels her breathing probably has declined over the last year or two but she tends to ingore it. States that other people see it but that it seems normal to her. Her breathing does limit the things that she can do. No noticeable improvement from recent prednisone taper. She has not been using Albuterol because of her osteoporosis and she does not see that it makes a big difference.  She does have a little productive cough with some pea size green mucus. She has not noticed any wheezing. She has had some chest pain. She has a history of mitral valve prolapse, last echocardiogram was in 2019. Her urinary symptoms have resolved, she  finished bactrim. ? ?02/09/2022- Interim hx  ?Patient presents today for surgical risk assessment. She is scheduled to have mitral valve repair on March 23rd with Dr. Burt Knack. She was seen in ED on 02/03/22 in ED for chest pain, dx with atypical pneumonia. She originally had CXR that showed RUL airspace opacity and possible small apical thorax. CT chest which was negative for pneumothorax but did showed diffuse tree-in-bud opacities concerning for pneumonia. She will be finishing Augmentin tomorrow. She has no current chest pain, cough or fevers. She has shortness of breath which is fairly significant but at baseline d/t mitral valve insufficiency. No signs of COPD exacerbation. She is compliant with Trelegy.  ? ? ?Pulmonary testing: ?01/13/2019-pulmonary function test-FVC 1.81 (70% predicted), postbronchodilator ratio 77, postbronchodilator FEV1 1.48 (77% predicted), no bronchodilator response, DLCO 17.28 (96% predicted) ?>>>Patient did use Trelegy Ellipta prior to PFT ? ? ?Allergies  ?Allergen Reactions  ? Sulfa Antibiotics Anaphylaxis  ? Sulfonamide Derivatives Anaphylaxis  ? Topamax [Topiramate] Other (See Comments)  ?  Metabolic acidosis   ? Biaxin [Clarithromycin] Other (See Comments)  ?  pericarditis  ? Hydrocodone Other (See Comments)  ?  "hyper and climbing the walls"  ? Motrin [Ibuprofen] Other (See Comments)  ?  headaches  ? Symbicort [Budesonide-Formoterol Fumarate] Other (See Comments)  ?  02/07/15 tremor  ? Percocet [Oxycodone-Acetaminophen] Itching and Rash  ? Tape Itching and Rash  ?  Use paper tape only  ? Tetracyclines & Related Other (See Comments)  ?  "immediate yeast infection"  ? ? ?Immunization  History  ?Administered Date(s) Administered  ? Fluad Quad(high Dose 65+) 08/11/2019, 08/03/2020, 08/08/2021  ? Influenza Split 09/11/2011  ? Influenza Whole 09/09/2007, 08/08/2008, 08/15/2010, 08/26/2012  ? Influenza,inj,Quad PF,6+ Mos 08/18/2013, 08/20/2014, 08/25/2018  ? Influenza-Unspecified 09/27/2015,  08/26/2017  ? Moderna Sars-Covid-2 Vaccination 01/01/2020, 01/20/2020, 02/16/2020, 07/25/2020, 11/22/2020  ? Pneumococcal Conjugate-13 02/12/2014  ? Pneumococcal Polysaccharide-23 11/26/2006, 10/21/2012, 11/22/2020  ? Td 01/01/2011, 06/30/2021  ? Zoster Recombinat (Shingrix) 02/22/2018, 07/08/2019  ? Zoster, Live 05/26/2007  ? ? ?Past Medical History:  ?Diagnosis Date  ? Aneurysm of aorta (HCC)   ? 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  ? Aortic root aneurysm   ? Arthritis   ? Asthma   ? Breast cancer ([place]) 2002  ? infiltrative ductal carcinoma   ? Cataract 2019  ? COPD (chronic obstructive pulmonary disease) ([place]) 07/2007  ? Ductal carcinoma of breast, estrogen receptor positive, stage 1 (HCC) 09/16/2012  ? Endometriosis   ? GERD (gastroesophageal reflux disease)   ? Hypercalcemia 09/14/2013  ? Hypertension   ? Lesion of breast 1992  ? right, benign  ? Lung disease   ? secondary to [name] infection  ? Mastalgia 05/1994  ? Osteoporosis, post-menopausal 03/14/2012  ? Ovarian tumor of borderline malignancy, right 2004  ? Pancreatitis   ? secondary to Cholelithiasis  ? Parkinson disease ([place]) 02/10/2015  ? Post-thoracotomy pain syndrome   ? Pseudomonas pneumonia ([place]) 03/2008  ? Traumatic closed fracture of distal clavicle with minimal displacement, left, initial encounter 06/2021  ? [facility]  ? Uterine fibroid   ? Vitamin D deficiency   ? ? ?Tobacco History: ?Social History  ? ?Tobacco Use  ?Smoking Status Never  ?Smokeless Tobacco Never  ? ?Counseling given: Not Answered ? ? ?Outpatient Medications Prior to Visit  ?Medication Sig Dispense Refill  ? acetaminophen (TYLENOL) 500 MG tablet Take 2 tablets (1,000 mg total) by mouth 2 (two) times daily as needed. (Patient taking differently: Take 1,000 mg by mouth every 6 (six) hours as needed for mild pain or moderate pain.)    ? albuterol (VENTOLIN HFA) 108 (90 Base) MCG/ACT inhaler Inhale 2 puffs into the lungs every 6 (six) hours as needed for wheezing or shortness of  breath. 18 g 1  ? amLODipine (NORVASC) 2.5 MG tablet TAKE 1 TABLET BY MOUTH ONCE A DAY 30 tablet 5  ? amoxicillin-clavulanate (AUGMENTIN) 875-125 MG tablet Take 1 tablet by mouth 2 (two) times daily for 7 days. 14 tablet 0  ? aspirin 81 MG tablet Take 1 tablet (81 mg total) by mouth daily. 30 tablet   ? Carbidopa-Levodopa ER (SINEMET CR) 25-100 MG tablet controlled release TAKE 2 TABLET BY MOUTH AT 9 AM, 1 TAB AT1 PM AND 1 TAB AT 5 PM 360 tablet 1  ? clonazePAM (KLONOPIN) 0.5 MG tablet TAKE 1/2 A TABLET BY MOUTH AT BEDTIME (Patient taking differently: Take 0.5 mg by mouth at bedtime.) 45 tablet 1  ? EPINEPHrine 0.3 mg/0.3 mL IJ SOAJ injection Inject 0.3 mg into the muscle as needed for anaphylaxis.    ? fexofenadine (ALLEGRA) 180 MG tablet Take 180 mg by mouth daily.    ? gabapentin ([name]) 300 MG capsule TAKE 2 CAPSULES BY MOUTH 3 TIMES DAILY 540 capsule 1  ? omeprazole (PRILOSEC) 20 MG capsule Take 1 capsule (20 mg total) by mouth daily. 30 capsule 11  ? TRELEGY ELLIPTA 100-62.5-25 MCG/INH AEPB INHALE 1 PUFF BY MOUTH ONCE A DAY 60 each 5  ? ?No facility-administered medications prior to  visit.  ? ? ?Review of Systems ? ?Review of Systems  ?Constitutional: Negative.   ?HENT: Negative.    ?Respiratory:  Positive for shortness of breath. Negative for cough, chest tightness and wheezing.   ?Cardiovascular: Negative.   ? ? ?Physical Exam ? ?BP 112/68 (BP Location: Right Arm, Cuff Size: Normal)   Pulse 75   Ht '5\' 2"'$  (1.575 m)   Wt 123 lb (55.8 kg)   SpO2 98%   BMI 22.50 kg/m?  ?Physical Exam ?Constitutional:   ?   General: She is not in acute distress. ?   Appearance: Normal appearance.  ?   Comments: Frail; chronically ill appearing without acute distress  ?HENT:  ?   Head: Normocephalic and atraumatic.  ?   Mouth/Throat:  ?   Mouth: Mucous membranes are moist.  ?   Pharynx: Oropharynx is clear.  ?Cardiovascular:  ?   Rate and Rhythm: Normal rate and regular rhythm.  ?Pulmonary:  ?   Effort: No respiratory  distress.  ?   Breath sounds: No stridor. No wheezing.  ?   Comments: Isolated rales left > right base; dyspnea when speaking ?Musculoskeletal:     ?   General: Normal range of motion.  ?Skin: ?   General: Skin is wa

## 2022-02-09 NOTE — Assessment & Plan Note (Addendum)
-   Patient will be considered high risk for prolonged mechanical ventilation and/or post-op pulmonary complications d/t age, COPD/asthma, [name] and recent pneumonia. She needs a repeat CXR today to follow-up on new areas seen on CT chest concerning for atypical pneumonia. She will finished Augmentin on 02/10/22. She has no acute respiratory symptoms. Dyspnea is significant but at baseline. Ultimate clearance will be decided on by patient surgeon/anesthesiologist.  ? ?Major Pulmonary risks identified in the multifactorial risk analysis are but not limited to a) pneumonia; b) recurrent intubation risk; c) prolonged or recurrent acute respiratory failure needing mechanical ventilation; d) prolonged hospitalization; e) DVT/Pulmonary embolism; f) Acute Pulmonary edema ? ?Recommend ?1. Short duration of surgery as much as possible and avoid paralytic if possible ?2. Recovery in step down or ICU with Pulmonary consultation if needed  ?3. DVT prophylaxis ?4. Aggressive pulmonary toilet with o2, bronchodilatation, and incentive spirometry and early ambulation ?

## 2022-02-09 NOTE — Assessment & Plan Note (Signed)
-   Symptoms do not appear acutely exacerbated today ?- Continue Trelegy 173mg one puff daily  ?

## 2022-02-09 NOTE — Patient Instructions (Signed)
You will be considered high risk for prolonged mechanical ventilation and/or post op pulmonary complications. We needs a repeat CXR today to follow-up on new areas seen on CT chest concerning for pneumonia. Finished Augmentin as directed.  ? ?Recommendations: ?Continue Trelegy as prescribed ? ?Orders: ?CXR today re: pre-op ? ?Follow-up: ?2-3 months with Dr. Chase Caller   ?

## 2022-02-09 NOTE — Progress Notes (Signed)
Please let patient know CXR showed unchanged RUL opacity in keeping with her known hx. NO new opacities. I will discuss surgical clearance with MR and get back to her if he has any concerns

## 2022-02-12 ENCOUNTER — Encounter (HOSPITAL_COMMUNITY): Payer: Self-pay

## 2022-02-12 ENCOUNTER — Ambulatory Visit (HOSPITAL_COMMUNITY)
Admission: RE | Admit: 2022-02-12 | Discharge: 2022-02-12 | Disposition: A | Discharge status: Home / Self Care | Payer: Medicare PPO | Source: Ambulatory Visit | Attending: Cardiovascular Disease | Admitting: Cardiovascular Disease

## 2022-02-12 ENCOUNTER — Encounter (HOSPITAL_COMMUNITY)
Admission: RE | Admit: 2022-02-12 | Discharge: 2022-02-12 | Disposition: A | Discharge status: Home / Self Care | Payer: Medicare PPO | Source: Ambulatory Visit | Attending: Cardiovascular Disease | Admitting: Cardiovascular Disease

## 2022-02-12 ENCOUNTER — Other Ambulatory Visit: Payer: Self-pay

## 2022-02-12 VITALS — BP 122/78 | HR 71 | Temp 97.5°F | Resp 17 | Ht 62.0 in | Wt 121.2 lb

## 2022-02-12 DIAGNOSIS — J439 Emphysema, unspecified: Secondary | ICD-10-CM | POA: Insufficient documentation

## 2022-02-12 DIAGNOSIS — Z853 Personal history of malignant neoplasm of breast: Secondary | ICD-10-CM | POA: Diagnosis not present

## 2022-02-12 DIAGNOSIS — Z885 Allergy status to narcotic agent status: Secondary | ICD-10-CM | POA: Diagnosis not present

## 2022-02-12 DIAGNOSIS — Z954 Presence of other heart-valve replacement: Secondary | ICD-10-CM | POA: Diagnosis not present

## 2022-02-12 DIAGNOSIS — Z888 Allergy status to other drugs, medicaments and biological substances status: Secondary | ICD-10-CM | POA: Diagnosis not present

## 2022-02-12 DIAGNOSIS — Z79899 Other long term (current) drug therapy: Secondary | ICD-10-CM | POA: Diagnosis not present

## 2022-02-12 DIAGNOSIS — Z886 Allergy status to analgesic agent status: Secondary | ICD-10-CM | POA: Diagnosis not present

## 2022-02-12 DIAGNOSIS — Z7982 Long term (current) use of aspirin: Secondary | ICD-10-CM | POA: Diagnosis not present

## 2022-02-12 DIAGNOSIS — G20 Parkinson's disease: Secondary | ICD-10-CM | POA: Diagnosis present

## 2022-02-12 DIAGNOSIS — I340 Nonrheumatic mitral (valve) insufficiency: Secondary | ICD-10-CM

## 2022-02-12 DIAGNOSIS — I5032 Chronic diastolic (congestive) heart failure: Secondary | ICD-10-CM | POA: Diagnosis present

## 2022-02-12 DIAGNOSIS — K219 Gastro-esophageal reflux disease without esophagitis: Secondary | ICD-10-CM | POA: Diagnosis present

## 2022-02-12 DIAGNOSIS — Z20822 Contact with and (suspected) exposure to covid-19: Secondary | ICD-10-CM | POA: Diagnosis present

## 2022-02-12 DIAGNOSIS — I110 Hypertensive heart disease with heart failure: Secondary | ICD-10-CM | POA: Diagnosis present

## 2022-02-12 DIAGNOSIS — M810 Age-related osteoporosis without current pathological fracture: Secondary | ICD-10-CM | POA: Diagnosis present

## 2022-02-12 DIAGNOSIS — Z01818 Encounter for other preprocedural examination: Secondary | ICD-10-CM | POA: Insufficient documentation

## 2022-02-12 DIAGNOSIS — M1990 Unspecified osteoarthritis, unspecified site: Secondary | ICD-10-CM | POA: Diagnosis present

## 2022-02-12 DIAGNOSIS — I10 Essential (primary) hypertension: Secondary | ICD-10-CM | POA: Diagnosis not present

## 2022-02-12 DIAGNOSIS — I951 Orthostatic hypotension: Secondary | ICD-10-CM | POA: Diagnosis present

## 2022-02-12 DIAGNOSIS — I719 Aortic aneurysm of unspecified site, without rupture: Secondary | ICD-10-CM | POA: Diagnosis present

## 2022-02-12 DIAGNOSIS — Z882 Allergy status to sulfonamides status: Secondary | ICD-10-CM | POA: Diagnosis not present

## 2022-02-12 DIAGNOSIS — E559 Vitamin D deficiency, unspecified: Secondary | ICD-10-CM | POA: Diagnosis present

## 2022-02-12 DIAGNOSIS — Z7951 Long term (current) use of inhaled steroids: Secondary | ICD-10-CM | POA: Diagnosis not present

## 2022-02-12 DIAGNOSIS — Z800 Family history of malignant neoplasm of digestive organs: Secondary | ICD-10-CM | POA: Diagnosis not present

## 2022-02-12 DIAGNOSIS — Z006 Encounter for examination for normal comparison and control in clinical research program: Secondary | ICD-10-CM | POA: Diagnosis not present

## 2022-02-12 DIAGNOSIS — Z881 Allergy status to other antibiotic agents status: Secondary | ICD-10-CM | POA: Diagnosis not present

## 2022-02-12 DIAGNOSIS — Z170 Estrogen receptor positive status [ER+]: Secondary | ICD-10-CM | POA: Diagnosis not present

## 2022-02-12 DIAGNOSIS — J449 Chronic obstructive pulmonary disease, unspecified: Secondary | ICD-10-CM | POA: Diagnosis present

## 2022-02-12 HISTORY — DX: Dyspnea, unspecified: R06.00

## 2022-02-12 HISTORY — DX: Angina pectoris, unspecified: I20.9

## 2022-02-12 LAB — BLOOD GAS, ARTERIAL
Acid-Base Excess: 2 mmol/L (ref 0.0–2.0)
Bicarbonate: 26.5 mmol/L (ref 20.0–28.0)
Drawn by: 60286
O2 Saturation: 98.7 %
Patient temperature: 37
pCO2 arterial: 40 mmHg (ref 32–48)
pH, Arterial: 7.43 (ref 7.35–7.45)
pO2, Arterial: 90 mmHg (ref 83–108)

## 2022-02-12 LAB — URINALYSIS, ROUTINE W REFLEX MICROSCOPIC
Bilirubin Urine: NEGATIVE
Glucose, UA: NEGATIVE mg/dL
Ketones, ur: 5 mg/dL — AB
Nitrite: NEGATIVE
Protein, ur: NEGATIVE mg/dL
Specific Gravity, Urine: 1.016 (ref 1.005–1.030)
pH: 6 (ref 5.0–8.0)

## 2022-02-12 LAB — COMPREHENSIVE METABOLIC PANEL
ALT: <5 U/L (ref 0–44)
AST: 16 U/L (ref 15–41)
Albumin: 3.8 g/dL (ref 3.5–5.0)
Alkaline Phosphatase: 63 U/L (ref 38–126)
Anion gap: 11 (ref 5–15)
BUN: 17 mg/dL (ref 8–23)
CO2: 22 mmol/L (ref 22–32)
Calcium: 9.4 mg/dL (ref 8.9–10.3)
Chloride: 104 mmol/L (ref 98–111)
Creatinine, Ser: 0.65 mg/dL (ref 0.44–1.00)
GFR, Estimated: >60 mL/min (ref >60)
Glucose, Bld: 104 mg/dL — ABNORMAL HIGH (ref 70–99)
Potassium: 3.8 mmol/L (ref 3.5–5.1)
Sodium: 137 mmol/L (ref 135–145)
Total Bilirubin: 1.2 mg/dL (ref 0.3–1.2)
Total Protein: 6.9 g/dL (ref 6.5–8.1)

## 2022-02-12 LAB — SARS CORONAVIRUS 2 BY RT PCR (HOSPITAL ORDER, PERFORMED IN ~~LOC~~ HOSPITAL LAB): SARS Coronavirus 2: NEGATIVE

## 2022-02-12 LAB — CBC
HCT: 39 % (ref 36.0–46.0)
Hemoglobin: 12.2 g/dL (ref 12.0–15.0)
MCH: 30.4 pg (ref 26.0–34.0)
MCHC: 31.3 g/dL (ref 30.0–36.0)
MCV: 97.3 fL (ref 80.0–100.0)
Platelets: 175 10*3/uL (ref 150–400)
RBC: 4.01 MIL/uL (ref 3.87–5.11)
RDW: 13.8 % (ref 11.5–15.5)
WBC: 6.3 10*3/uL (ref 4.0–10.5)
nRBC: 0 % (ref 0.0–0.2)

## 2022-02-12 LAB — PROTIME-INR
INR: 1.1 (ref 0.8–1.2)
Prothrombin Time: 14.2 seconds (ref 11.4–15.2)

## 2022-02-12 LAB — TYPE AND SCREEN
ABO/RH(D): O POS
Antibody Screen: NEGATIVE

## 2022-02-12 LAB — SURGICAL PCR SCREEN
MRSA, PCR: NEGATIVE
Staphylococcus aureus: POSITIVE — AB

## 2022-02-12 LAB — BRAIN NATRIURETIC PEPTIDE: B Natriuretic Peptide: 317.9 pg/mL — ABNORMAL HIGH (ref 0.0–100.0)

## 2022-02-12 NOTE — Progress Notes (Signed)
Abnormal labs in PAT: BNP 317.9; Urinalysis: Hgb urine dipsrick - small; Ketones, ur - 5; Leukocytes, ua - moderate; Bacteria,ua - rare. Dr. Burt Knack office was notified Lynelle Smoke, Curt Bears, RN). ?

## 2022-02-12 NOTE — Progress Notes (Signed)
PCP - [name], MD ?Cardiologist - [name], MD ? ?PPM/ICD - denies ?Device Orders - n/a ?Rep Notified - n/a ? ?Chest x-ray - 02/12/2022 ?EKG - 02/03/2022 ?Stress Test - denies ?ECHO - 01/22/2022 ?Cardiac Cath - denies ? ?Sleep Study - denies ?CPAP - n/a ? ?Fasting Blood Sugar - n/a ? ?Blood Thinner Instructions: n/a ? ?Aspirin Instructions: Patient will continue to take Aspirin even the day of surgery per MD order. ? ?Patient was instructed: As of today, STOP taking any Aleve, Naproxen, Ibuprofen, Motrin, Advil, Goody's, BC's, all herbal medications, fish oil, and all vitamins ? ?ERAS Protcol - n/a ? ?COVID TEST- yes, done in PAT on 02/12/2022 ? ? ?Anesthesia review: yes - cardiac history ? ?Patient denies shortness of breath, fever, cough and chest pain at PAT appointment ? ? ?All instructions explained to the patient, with a verbal understanding of the material. Patient agrees to go over the instructions while at home for a better understanding. Patient also instructed to self quarantine after being tested for COVID-19. The opportunity to ask questions was provided. ?  ?

## 2022-02-14 MED ORDER — MAGNESIUM SULFATE 50 % IJ SOLN
40.0000 meq | INTRAMUSCULAR | Status: DC
  Filled 2022-02-14 (×2): qty 9.85

## 2022-02-14 MED ORDER — INSULIN REGULAR(HUMAN) IN NACL 100-0.9 UT/100ML-% IV SOLN
INTRAVENOUS | Status: DC
  Filled 2022-02-14 (×2): qty 100

## 2022-02-14 MED ORDER — TRANEXAMIC ACID (OHS) PUMP PRIME SOLUTION
2.0000 mg/kg | INTRAVENOUS | Status: DC
  Filled 2022-02-14 (×2): qty 1.1

## 2022-02-14 MED ORDER — HEPARIN 30,000 UNITS/1000 ML (OHS) CELLSAVER SOLUTION
Status: DC
Start: 2022-02-15 — End: 2022-02-15
  Filled 2022-02-14 (×2): qty 1000

## 2022-02-14 MED ORDER — HEPARIN SODIUM (PORCINE) 1000 UNIT/ML IJ SOLN
INTRAMUSCULAR | Status: DC
Start: 2022-02-15 — End: 2022-02-15
  Filled 2022-02-14 (×2): qty 2.5

## 2022-02-14 MED ORDER — NITROGLYCERIN IN D5W 200-5 MCG/ML-% IV SOLN
2.0000 ug/min | INTRAVENOUS | Status: DC
  Filled 2022-02-14 (×2): qty 250

## 2022-02-14 MED ORDER — PHENYLEPHRINE HCL-NACL 20-0.9 MG/250ML-% IV SOLN
30.0000 ug/min | INTRAVENOUS | Status: AC
  Administered 2022-02-15: 25 ug/min via INTRAVENOUS
  Filled 2022-02-14 (×2): qty 250

## 2022-02-14 MED ORDER — DEXMEDETOMIDINE HCL IN NACL 400 MCG/100ML IV SOLN
0.1000 ug/kg/h | INTRAVENOUS | Status: DC
  Filled 2022-02-14 (×2): qty 100

## 2022-02-14 MED ORDER — CEFAZOLIN SODIUM-DEXTROSE 2-4 GM/100ML-% IV SOLN
2.0000 g | INTRAVENOUS | Status: AC
  Administered 2022-02-15: 2 g via INTRAVENOUS
  Filled 2022-02-14 (×2): qty 100

## 2022-02-14 MED ORDER — TRANEXAMIC ACID 1000 MG/10ML IV SOLN
1.5000 mg/kg/h | INTRAVENOUS | Status: DC
  Filled 2022-02-14 (×2): qty 25

## 2022-02-14 MED ORDER — TRANEXAMIC ACID (OHS) BOLUS VIA INFUSION
15.0000 mg/kg | INTRAVENOUS | Status: DC
  Filled 2022-02-14: qty 825

## 2022-02-14 MED ORDER — VANCOMYCIN HCL 1250 MG/250ML IV SOLN
1250.0000 mg | INTRAVENOUS | Status: DC
  Filled 2022-02-14 (×2): qty 250

## 2022-02-14 MED ORDER — EPINEPHRINE HCL 5 MG/250ML IV SOLN IN NS
0.0000 ug/min | INTRAVENOUS | Status: DC
  Filled 2022-02-14 (×2): qty 250

## 2022-02-14 MED ORDER — POTASSIUM CHLORIDE 2 MEQ/ML IV SOLN
80.0000 meq | INTRAVENOUS | Status: DC
Start: 2022-02-15 — End: 2022-02-15
  Filled 2022-02-14 (×2): qty 40

## 2022-02-14 MED ORDER — MILRINONE LACTATE IN DEXTROSE 20-5 MG/100ML-% IV SOLN
0.3000 ug/kg/min | INTRAVENOUS | Status: DC
  Filled 2022-02-14 (×2): qty 100

## 2022-02-14 MED ORDER — CEFAZOLIN SODIUM-DEXTROSE 2-4 GM/100ML-% IV SOLN
2.0000 g | INTRAVENOUS | Status: DC
Start: 2022-02-15 — End: 2022-02-15
  Filled 2022-02-14 (×2): qty 100

## 2022-02-14 MED ORDER — NOREPINEPHRINE 4 MG/250ML-% IV SOLN
0.0000 ug/min | INTRAVENOUS | Status: DC
  Filled 2022-02-14: qty 250

## 2022-02-14 NOTE — Anesthesia Preprocedure Evaluation (Addendum)
Anesthesia Evaluation  ?Patient identified by MRN, date of birth, ID band ?Patient awake ? ? ? ?Reviewed: ?Allergy & Precautions, NPO status , Patient's Chart, lab work & pertinent test results ? ?Airway ?Mallampati: II ? ?TM Distance: >3 FB ?Neck ROM: Full ? ? ? Dental ?no notable dental hx. ? ?  ?Pulmonary ?asthma , COPD,  COPD inhaler,  ?  ?Pulmonary exam normal ? ? ? ? ? ? ? Cardiovascular ?hypertension, + Valvular Problems/Murmurs MR  ?Rhythm:Regular Rate:Normal ?+ Systolic murmurs ? ?  ?Neuro/Psych ?Dementia negative neurological ROS ?   ? GI/Hepatic ?Neg liver ROS, GERD  Medicated,  ?Endo/Other  ?negative endocrine ROS ? Renal/GU ?  ?negative genitourinary ?  ?Musculoskeletal ? ?(+) Arthritis , Osteoarthritis,   ? Abdominal ?Normal abdominal exam  (+)   ?Peds ? Hematology ?negative hematology ROS ?(+)   ?Anesthesia Other Findings ? ? Reproductive/Obstetrics ? ?  ? ? ? ? ? ? ? ? ? ? ? ? ? ?  ?  ? ? ? ? ? ? ? ?Anesthesia Physical ?Anesthesia Plan ? ?ASA: 3 ? ?Anesthesia Plan: General  ? ?Post-op Pain Management:   ? ?Induction: Intravenous ? ?PONV Risk Score and Plan: 3 and Ondansetron, Dexamethasone and Treatment may vary due to age or medical condition ? ?Airway Management Planned: Mask and Oral ETT ? ?Additional Equipment: Arterial line ? ?Intra-op Plan:  ? ?Post-operative Plan: Extubation in OR ? ?Informed Consent: I have reviewed the patients History and Physical, chart, labs and discussed the procedure including the risks, benefits and alternatives for the proposed anesthesia with the patient or authorized representative who has indicated his/her understanding and acceptance.  ? ? ? ?Dental advisory given ? ?Plan Discussed with: CRNA ? ?Anesthesia Plan Comments: (Lab Results ?     Component                Value               Date                 ?     WBC                      6.3                 02/12/2022           ?     HGB                      12.2                 02/12/2022           ?     HCT                      39.0                02/12/2022           ?     MCV                      97.3                02/12/2022           ?     PLT                      175  02/12/2022           ?Lab Results ?     Component                Value               Date                 ?     NA                       137                 02/12/2022           ?     K                        3.8                 02/12/2022           ?     CO2                      22                  02/12/2022           ?     GLUCOSE                  104 (H)             02/12/2022           ?     BUN                      17                  02/12/2022           ?     CREATININE               0.65                02/12/2022           ?     CALCIUM                  9.4                 02/12/2022           ?     EGFR                     62                  12/19/2021           ?     GFRNONAA                 >60                 02/12/2022           ? ?ECHO 02/23: ??1. Barlow's mitral valve with bileaflet prolapse. There is severe  ?prolapse of the A2 segment with incomplete leaflet coaptation with  ?resultant severe mitral regurgitation. There is no apparent flail segment.  ?The MR jet is eccentric and posteriorly  ?directed. There is systolic flow reversal in the L upper/lower pulmonary  ?veins. Flail  gap 6.4 mm. PMVL length 18 mm. MV MG 3.0 mmHG @ 66 bpm. MVA  ?6.58 cm2. 3D VCA 0.57 cm2. 2D PISA measurements overestimated due to  ?eccentric jet and not reported. All  ?findings support severe mitral regurgitation. 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 Severe mitral valve regurgitation. No evidence of mitral  ?stenosis. The mean mitral valve gradient is 3.0 mmHg with average heart  ?rate of 66 bpm.  ??2. Left ventricular ejection fraction, by estimation, is 65 to 70%. Left  ?ventricular ejection fraction by 3D volume is 69 %. The left ventricle has  ?normal function.  ??3. Right ventricular systolic function is  normal. The right ventricular  ?size is normal.  ??4. Left atrial size was severely dilated. No left atrial/left atrial  ?appendage thrombus was detected.  ??5. The aortic valve is tricuspid. Aortic valve regurgitation is not  ?visualized. No aortic stenosis is present.  ??6. There is mild (Grade II) layered plaque involving the descending  ?aorta. )  ? ? ? ? ? ?Anesthesia Quick Evaluation ? ?

## 2022-02-15 ENCOUNTER — Inpatient Hospital Stay (HOSPITAL_COMMUNITY)
Admission: RE | Admit: 2022-02-15 | Discharge: 2022-02-15 | Disposition: A | Discharge status: Home / Self Care | Payer: Medicare PPO | Source: Home / Self Care | Attending: Cardiovascular Disease | Admitting: Cardiovascular Disease

## 2022-02-15 ENCOUNTER — Inpatient Hospital Stay (HOSPITAL_COMMUNITY)
Admission: RE | Admit: 2022-02-15 | Discharge: 2022-02-16 | DRG: 267 | Disposition: A | Discharge status: Skilled Nursing Facility | Payer: Medicare PPO | Attending: Cardiovascular Disease | Admitting: Cardiovascular Disease

## 2022-02-15 ENCOUNTER — Other Ambulatory Visit: Payer: Self-pay

## 2022-02-15 ENCOUNTER — Inpatient Hospital Stay (HOSPITAL_COMMUNITY): Payer: Medicare PPO | Admitting: Physician Assistant

## 2022-02-15 ENCOUNTER — Encounter (HOSPITAL_COMMUNITY): Payer: Self-pay | Admitting: Cardiovascular Disease

## 2022-02-15 ENCOUNTER — Inpatient Hospital Stay (HOSPITAL_COMMUNITY): Payer: Medicare PPO | Admitting: Anesthesiology

## 2022-02-15 ENCOUNTER — Encounter (HOSPITAL_COMMUNITY)
Admission: RE | Disposition: A | Discharge status: Skilled Nursing Facility | Payer: Medicare PPO | Source: Home / Self Care | Attending: Cardiovascular Disease

## 2022-02-15 DIAGNOSIS — Z808 Family history of malignant neoplasm of other organs or systems: Secondary | ICD-10-CM

## 2022-02-15 DIAGNOSIS — Z853 Personal history of malignant neoplasm of breast: Secondary | ICD-10-CM | POA: Diagnosis not present

## 2022-02-15 DIAGNOSIS — Z885 Allergy status to narcotic agent status: Secondary | ICD-10-CM

## 2022-02-15 DIAGNOSIS — Z79899 Other long term (current) drug therapy: Secondary | ICD-10-CM | POA: Diagnosis not present

## 2022-02-15 DIAGNOSIS — I340 Nonrheumatic mitral (valve) insufficiency: Secondary | ICD-10-CM | POA: Diagnosis present

## 2022-02-15 DIAGNOSIS — I951 Orthostatic hypotension: Secondary | ICD-10-CM | POA: Diagnosis present

## 2022-02-15 DIAGNOSIS — Z006 Encounter for examination for normal comparison and control in clinical research program: Secondary | ICD-10-CM | POA: Diagnosis not present

## 2022-02-15 DIAGNOSIS — I10 Essential (primary) hypertension: Secondary | ICD-10-CM

## 2022-02-15 DIAGNOSIS — Z7982 Long term (current) use of aspirin: Secondary | ICD-10-CM | POA: Diagnosis not present

## 2022-02-15 DIAGNOSIS — M1990 Unspecified osteoarthritis, unspecified site: Secondary | ICD-10-CM | POA: Diagnosis present

## 2022-02-15 DIAGNOSIS — J449 Chronic obstructive pulmonary disease, unspecified: Secondary | ICD-10-CM | POA: Diagnosis present

## 2022-02-15 DIAGNOSIS — Z8262 Family history of osteoporosis: Secondary | ICD-10-CM

## 2022-02-15 DIAGNOSIS — Z886 Allergy status to analgesic agent status: Secondary | ICD-10-CM

## 2022-02-15 DIAGNOSIS — Z801 Family history of malignant neoplasm of trachea, bronchus and lung: Secondary | ICD-10-CM

## 2022-02-15 DIAGNOSIS — Z20822 Contact with and (suspected) exposure to covid-19: Secondary | ICD-10-CM | POA: Diagnosis present

## 2022-02-15 DIAGNOSIS — I719 Aortic aneurysm of unspecified site, without rupture: Secondary | ICD-10-CM | POA: Diagnosis present

## 2022-02-15 DIAGNOSIS — I5032 Chronic diastolic (congestive) heart failure: Secondary | ICD-10-CM | POA: Diagnosis present

## 2022-02-15 DIAGNOSIS — I712 Thoracic aortic aneurysm, without rupture, unspecified: Secondary | ICD-10-CM | POA: Diagnosis present

## 2022-02-15 DIAGNOSIS — Z882 Allergy status to sulfonamides status: Secondary | ICD-10-CM

## 2022-02-15 DIAGNOSIS — Z888 Allergy status to other drugs, medicaments and biological substances status: Secondary | ICD-10-CM | POA: Diagnosis not present

## 2022-02-15 DIAGNOSIS — Z954 Presence of other heart-valve replacement: Secondary | ICD-10-CM | POA: Diagnosis not present

## 2022-02-15 DIAGNOSIS — G20 Parkinson's disease: Secondary | ICD-10-CM | POA: Diagnosis present

## 2022-02-15 DIAGNOSIS — Z881 Allergy status to other antibiotic agents status: Secondary | ICD-10-CM | POA: Diagnosis not present

## 2022-02-15 DIAGNOSIS — Z95818 Presence of other cardiac implants and grafts: Principal | ICD-10-CM

## 2022-02-15 DIAGNOSIS — Z800 Family history of malignant neoplasm of digestive organs: Secondary | ICD-10-CM

## 2022-02-15 DIAGNOSIS — E559 Vitamin D deficiency, unspecified: Secondary | ICD-10-CM | POA: Diagnosis present

## 2022-02-15 DIAGNOSIS — Z803 Family history of malignant neoplasm of breast: Secondary | ICD-10-CM

## 2022-02-15 DIAGNOSIS — Z7951 Long term (current) use of inhaled steroids: Secondary | ICD-10-CM

## 2022-02-15 DIAGNOSIS — I110 Hypertensive heart disease with heart failure: Secondary | ICD-10-CM | POA: Diagnosis present

## 2022-02-15 DIAGNOSIS — K219 Gastro-esophageal reflux disease without esophagitis: Secondary | ICD-10-CM | POA: Diagnosis present

## 2022-02-15 DIAGNOSIS — Z170 Estrogen receptor positive status [ER+]: Secondary | ICD-10-CM | POA: Diagnosis not present

## 2022-02-15 DIAGNOSIS — Z820 Family history of epilepsy and other diseases of the nervous system: Secondary | ICD-10-CM

## 2022-02-15 DIAGNOSIS — M810 Age-related osteoporosis without current pathological fracture: Secondary | ICD-10-CM | POA: Diagnosis present

## 2022-02-15 DIAGNOSIS — Z9889 Other specified postprocedural states: Secondary | ICD-10-CM

## 2022-02-15 HISTORY — PX: TEE WITHOUT CARDIOVERSION: SHX5443

## 2022-02-15 HISTORY — PX: MITRAL VALVE REPAIR: CATH118311

## 2022-02-15 HISTORY — DX: Other specified postprocedural states: Z98.890

## 2022-02-15 LAB — CREATININE, SERUM
Creatinine, Ser: 0.74 mg/dL (ref 0.44–1.00)
GFR, Estimated: >60 mL/min (ref >60)

## 2022-02-15 LAB — POCT ACTIVATED CLOTTING TIME
Activated Clotting Time: 197 seconds
Activated Clotting Time: 251 seconds
Activated Clotting Time: 287 seconds
Activated Clotting Time: 281 seconds
Activated Clotting Time: 293 seconds

## 2022-02-15 LAB — CBC
HCT: 37.3 % (ref 36.0–46.0)
Hemoglobin: 12.1 g/dL (ref 12.0–15.0)
MCH: 30.7 pg (ref 26.0–34.0)
MCHC: 32.4 g/dL (ref 30.0–36.0)
MCV: 94.7 fL (ref 80.0–100.0)
Platelets: 155 10*3/uL (ref 150–400)
RBC: 3.94 MIL/uL (ref 3.87–5.11)
RDW: 13.8 % (ref 11.5–15.5)
WBC: 6.8 10*3/uL (ref 4.0–10.5)
nRBC: 0 % (ref 0.0–0.2)

## 2022-02-15 LAB — ECHO TEE
MV M vel: 7.93 m/s
MV Peak grad: 251.5 mmHg
Radius: 1.3 cm

## 2022-02-15 LAB — ABO/RH: ABO/RH(D): O POS

## 2022-02-15 SURGERY — MITRAL VALVE REPAIR
Anesthesia: General

## 2022-02-15 MED ORDER — LACTATED RINGERS IV SOLN: INTRAVENOUS | Status: DC

## 2022-02-15 MED ORDER — PROPOFOL 10 MG/ML IV BOLUS
INTRAVENOUS | Status: DC | PRN
  Administered 2022-02-15: 120 mg via INTRAVENOUS

## 2022-02-15 MED ORDER — SODIUM CHLORIDE 0.9% FLUSH
3.0000 mL | Freq: Two times a day (BID) | INTRAVENOUS | Status: DC
  Administered 2022-02-15 – 2022-02-16 (×2): 3 mL via INTRAVENOUS

## 2022-02-15 MED ORDER — FENTANYL CITRATE (PF) 100 MCG/2ML IJ SOLN
INTRAMUSCULAR | Status: DC | PRN
  Administered 2022-02-15: 100 ug via INTRAVENOUS

## 2022-02-15 MED ORDER — FUROSEMIDE 10 MG/ML IJ SOLN
INTRAMUSCULAR | Status: DC | PRN
  Administered 2022-02-15: 10 mg via INTRAMUSCULAR

## 2022-02-15 MED ORDER — UMECLIDINIUM BROMIDE 62.5 MCG/ACT IN AEPB
1.0000 | INHALATION_SPRAY | Freq: Every day | RESPIRATORY_TRACT | Status: DC
  Administered 2022-02-16: 1 via RESPIRATORY_TRACT
  Filled 2022-02-15: qty 7

## 2022-02-15 MED ORDER — AMLODIPINE BESYLATE 2.5 MG PO TABS
2.5000 mg | ORAL_TABLET | Freq: Every day | ORAL | Status: DC
  Filled 2022-02-15: qty 1

## 2022-02-15 MED ORDER — ALBUTEROL SULFATE HFA 108 (90 BASE) MCG/ACT IN AERS: 2.0000 | INHALATION_SPRAY | Freq: Four times a day (QID) | RESPIRATORY_TRACT | Status: DC | PRN

## 2022-02-15 MED ORDER — CHLORHEXIDINE GLUCONATE 4 % EX LIQD: 60.0000 mL | Freq: Once | CUTANEOUS | Status: DC

## 2022-02-15 MED ORDER — FUROSEMIDE 10 MG/ML IJ SOLN
INTRAMUSCULAR | Status: AC
  Filled 2022-02-15: qty 4

## 2022-02-15 MED ORDER — SODIUM CHLORIDE 0.9 % IV SOLN: INTRAVENOUS | Status: DC

## 2022-02-15 MED ORDER — CLONAZEPAM 0.5 MG PO TABS: 0.5000 mg | ORAL_TABLET | Freq: Every day | ORAL | Status: DC

## 2022-02-15 MED ORDER — ACETAMINOPHEN 325 MG PO TABS
650.0000 mg | ORAL_TABLET | ORAL | Status: DC | PRN
  Administered 2022-02-15: 650 mg via ORAL
  Filled 2022-02-15: qty 2

## 2022-02-15 MED ORDER — PHENYLEPHRINE 40 MCG/ML (10ML) SYRINGE FOR IV PUSH (FOR BLOOD PRESSURE SUPPORT)
PREFILLED_SYRINGE | INTRAVENOUS | Status: DC | PRN
  Administered 2022-02-15: 40 ug via INTRAVENOUS

## 2022-02-15 MED ORDER — CARBIDOPA-LEVODOPA ER 25-100 MG PO TBCR
2.0000 | EXTENDED_RELEASE_TABLET | Freq: Every day | ORAL | Status: DC
  Administered 2022-02-16: 2 via ORAL
  Filled 2022-02-15: qty 2

## 2022-02-15 MED ORDER — HEPARIN SODIUM (PORCINE) 1000 UNIT/ML IJ SOLN
INTRAMUSCULAR | Status: DC | PRN
  Administered 2022-02-15: 3000 [IU] via INTRAVENOUS
  Administered 2022-02-15: 8000 [IU] via INTRAVENOUS
  Administered 2022-02-15: 5000 [IU] via INTRAVENOUS

## 2022-02-15 MED ORDER — CARBIDOPA-LEVODOPA ER 25-100 MG PO TBCR: 2.0000 | EXTENDED_RELEASE_TABLET | Freq: Every day | ORAL | Status: DC

## 2022-02-15 MED ORDER — HYDRALAZINE HCL 20 MG/ML IJ SOLN: 5.0000 mg | INTRAMUSCULAR | Status: DC | PRN

## 2022-02-15 MED ORDER — CARBIDOPA-LEVODOPA ER 25-100 MG PO TBCR
1.0000 | EXTENDED_RELEASE_TABLET | Freq: Every day | ORAL | Status: DC
  Administered 2022-02-15: 1 via ORAL
  Filled 2022-02-15 (×2): qty 1

## 2022-02-15 MED ORDER — SODIUM CHLORIDE 0.9 % IV SOLN: 250.0000 mL | INTRAVENOUS | Status: DC | PRN

## 2022-02-15 MED ORDER — CHLORHEXIDINE GLUCONATE 4 % EX LIQD: 30.0000 mL | CUTANEOUS | Status: DC

## 2022-02-15 MED ORDER — LABETALOL HCL 5 MG/ML IV SOLN
INTRAVENOUS | Status: DC | PRN
  Administered 2022-02-15 (×4): 2.5 mg via INTRAVENOUS

## 2022-02-15 MED ORDER — CARBIDOPA-LEVODOPA ER 25-100 MG PO TBCR
1.0000 | EXTENDED_RELEASE_TABLET | Freq: Every day | ORAL | Status: DC
  Administered 2022-02-16: 1 via ORAL
  Filled 2022-02-15: qty 1

## 2022-02-15 MED ORDER — ONDANSETRON HCL 4 MG/2ML IJ SOLN
INTRAMUSCULAR | Status: DC | PRN
  Administered 2022-02-15: 4 mg via INTRAVENOUS

## 2022-02-15 MED ORDER — ALBUTEROL SULFATE (2.5 MG/3ML) 0.083% IN NEBU: 2.5000 mg | INHALATION_SOLUTION | Freq: Four times a day (QID) | RESPIRATORY_TRACT | Status: DC | PRN

## 2022-02-15 MED ORDER — SODIUM CHLORIDE 0.9% FLUSH: 3.0000 mL | INTRAVENOUS | Status: DC | PRN

## 2022-02-15 MED ORDER — SODIUM CHLORIDE 0.9 % IV SOLN
INTRAVENOUS | Status: AC | PRN
  Administered 2022-02-15: 10 mL/h via INTRAVENOUS

## 2022-02-15 MED ORDER — LIDOCAINE 2% (20 MG/ML) 5 ML SYRINGE
INTRAMUSCULAR | Status: DC | PRN
  Administered 2022-02-15: 40 mg via INTRAVENOUS

## 2022-02-15 MED ORDER — ROCURONIUM BROMIDE 10 MG/ML (PF) SYRINGE
PREFILLED_SYRINGE | INTRAVENOUS | Status: DC | PRN
  Administered 2022-02-15 (×2): 10 mg via INTRAVENOUS
  Administered 2022-02-15: 60 mg via INTRAVENOUS
  Administered 2022-02-15: 10 mg via INTRAVENOUS

## 2022-02-15 MED ORDER — DEXAMETHASONE SODIUM PHOSPHATE 10 MG/ML IJ SOLN
INTRAMUSCULAR | Status: DC | PRN
  Administered 2022-02-15: 4 mg via INTRAVENOUS

## 2022-02-15 MED ORDER — ASPIRIN EC 81 MG PO TBEC
81.0000 mg | DELAYED_RELEASE_TABLET | Freq: Every day | ORAL | Status: DC
  Administered 2022-02-16: 81 mg via ORAL
  Filled 2022-02-15: qty 1

## 2022-02-15 MED ORDER — LABETALOL HCL 5 MG/ML IV SOLN: 10.0000 mg | INTRAVENOUS | Status: DC | PRN

## 2022-02-15 MED ORDER — CHLORHEXIDINE GLUCONATE 0.12 % MT SOLN
15.0000 mL | Freq: Once | OROMUCOSAL | Status: AC
  Administered 2022-02-15: 15 mL via OROMUCOSAL
  Filled 2022-02-15: qty 15

## 2022-02-15 MED ORDER — ONDANSETRON HCL 4 MG/2ML IJ SOLN: 4.0000 mg | Freq: Four times a day (QID) | INTRAMUSCULAR | Status: DC | PRN

## 2022-02-15 MED ORDER — PROTAMINE SULFATE 10 MG/ML IV SOLN
INTRAVENOUS | Status: DC | PRN
  Administered 2022-02-15: 30 mg via INTRAVENOUS

## 2022-02-15 MED ORDER — FLUTICASONE FUROATE-VILANTEROL 100-25 MCG/ACT IN AEPB
1.0000 | INHALATION_SPRAY | Freq: Every day | RESPIRATORY_TRACT | Status: DC
  Administered 2022-02-16: 1 via RESPIRATORY_TRACT
  Filled 2022-02-15: qty 28

## 2022-02-15 MED ORDER — HEPARIN SODIUM (PORCINE) 5000 UNIT/ML IJ SOLN
5000.0000 [IU] | Freq: Three times a day (TID) | INTRAMUSCULAR | Status: DC
Start: 2022-02-15 — End: 2022-02-16
  Administered 2022-02-16: 5000 [IU] via SUBCUTANEOUS
  Filled 2022-02-15 (×2): qty 1

## 2022-02-15 MED ORDER — CLONAZEPAM 0.25 MG PO TBDP
0.5000 mg | ORAL_TABLET | Freq: Every day | ORAL | Status: DC
  Administered 2022-02-15: 0.5 mg via ORAL
  Filled 2022-02-15: qty 2

## 2022-02-15 MED ORDER — CLOPIDOGREL BISULFATE 75 MG PO TABS
75.0000 mg | ORAL_TABLET | Freq: Every day | ORAL | Status: DC
  Administered 2022-02-15 – 2022-02-16 (×2): 75 mg via ORAL
  Filled 2022-02-15 (×3): qty 1

## 2022-02-15 MED ORDER — GABAPENTIN 300 MG PO CAPS
300.0000 mg | ORAL_CAPSULE | Freq: Three times a day (TID) | ORAL | Status: DC
  Administered 2022-02-15 – 2022-02-16 (×3): 300 mg via ORAL
  Filled 2022-02-15 (×3): qty 1

## 2022-02-15 SURGICAL SUPPLY — 20 items
BNDG HMST LF TOP THROMBIX (HEMOSTASIS) ×1
CATH MACH1 8F JR4 90CM (CATHETERS) IMPLANT
CATH MITRA STEERABLE GUIDE (CATHETERS) ×1 IMPLANT
CLIP MITRA G4 DELIVERY SYS XTW (Clip) ×2 IMPLANT
CLOSURE PERCLOSE PROSTYLE (VASCULAR PRODUCTS) ×2 IMPLANT
ELECT DEFIB PAD ADLT CADENCE (PAD) ×1 IMPLANT
KIT DILATOR VASC 18G NDL (KITS) ×1 IMPLANT
KIT HEART LEFT (KITS) ×6 IMPLANT
KIT VERSACROSS LRG ACCESS (CATHETERS) ×1 IMPLANT
PACK CARDIAC CATHETERIZATION (CUSTOM PROCEDURE TRAY) ×3 IMPLANT
PATCH THROMBIX TOPICAL PLAIN (HEMOSTASIS) ×1 IMPLANT
SHEATH PINNACLE 8F 10CM (SHEATH) ×1 IMPLANT
SHEATH PROBE COVER 6X72 (BAG) ×4 IMPLANT
SHIELD RADPAD SCOOP 12X17 (MISCELLANEOUS) ×1 IMPLANT
STOPCOCK MORSE 400PSI 3WAY (MISCELLANEOUS) ×18 IMPLANT
SYSTEM MITRACLIP G4 (SYSTAGENIX WOUND MANAGEMENT) ×1 IMPLANT
TRANSDUCER W/STOPCOCK (MISCELLANEOUS) ×3 IMPLANT
TUBING ART PRESS 72  MALE/FEM (TUBING) ×2
TUBING ART PRESS 72 MALE/FEM (TUBING) ×2 IMPLANT
TUBING CIL FLEX 10 FLL-RA (TUBING) ×1 IMPLANT

## 2022-02-15 NOTE — Anesthesia Postprocedure Evaluation (Signed)
Anesthesia Post Note ? ?Patient: Crystal Bass ? ?Procedure(s) Performed: MITRAL VALVE REPAIR ?TRANSESOPHAGEAL ECHOCARDIOGRAM (TEE) ? ?  ? ?Patient location during evaluation: PACU ?Anesthesia Type: General ?Level of consciousness: awake and alert ?Pain management: pain level controlled ?Vital Signs Assessment: post-procedure vital signs reviewed and stable ?Respiratory status: spontaneous breathing, nonlabored ventilation, respiratory function stable and patient connected to nasal cannula oxygen ?Cardiovascular status: blood pressure returned to baseline and stable ?Postop Assessment: no apparent nausea or vomiting ?Anesthetic complications: no ? ? ?No notable events documented. ? ?Last Vitals:  ?Vitals:  ? 02/15/22 1155 02/15/22 1219  ?BP: (!) 145/85   ?Pulse: 84 84  ?Resp: (!) 28 (!) 30  ?Temp:  36.7 ?C  ?SpO2: 94% 94%  ?  ?Last Pain:  ?Vitals:  ? 02/15/22 1219  ?TempSrc: Oral  ?PainSc: 0-No pain  ? ? ?  ?  ?  ?  ?  ?  ? ?Crystal Bass ? ? ? ? ?

## 2022-02-15 NOTE — Interval H&P Note (Signed)
History and Physical Interval Note: ? ?02/15/2022 ?7:30 AM ? ?Crystal Bass  has presented today for surgery, with the diagnosis of severe mitral insufficiency.  The various methods of treatment have been discussed with the patient and family. After consideration of risks, benefits and other options for treatment, the patient has consented to  Procedure(s): ?MITRAL VALVE REPAIR (N/A) ?TRANSESOPHAGEAL ECHOCARDIOGRAM (TEE) (N/A) as a surgical intervention.  The patient's history has been reviewed, patient examined, no change in status, stable for surgery.  I have reviewed the patient's chart and labs.  Questions were answered to the patient's satisfaction.   ? ? ?Crystal Bass ? ? ?

## 2022-02-15 NOTE — Progress Notes (Signed)
Purewick placed, tolerated well, +clear, yellow void noted ? ?

## 2022-02-15 NOTE — Interval H&P Note (Signed)
History and Physical Interval Note: ? ?02/15/2022 ?8:07 AM ? ?CALVIN CHURA  has presented today for surgery, with the diagnosis of severe mitral insufficiency.  The various methods of treatment have been discussed with the patient and family. After consideration of risks, benefits and other options for treatment, the patient has consented to  Procedure(s): ?MITRAL VALVE REPAIR (N/A) ?TRANSESOPHAGEAL ECHOCARDIOGRAM (TEE) (N/A) as a surgical intervention.  The patient's history has been reviewed, patient examined, no change in status, stable for surgery.  I have reviewed the patient's chart and labs.  Questions were answered to the patient's satisfaction.   ? ?Pulmonary evaluation reviewed. Discussed with team last night. Understand risk of prolonged ventilation due to her lung disease. She remains highly symptomatic with severe MR, NYHA III symptoms. Plan to proceed with transcatheter edge to edge mitral repair as above.  ? ?Sherren Mocha ? ? ?

## 2022-02-15 NOTE — Progress Notes (Signed)
Dr. Burt Knack made aware patient's PCR was staph positive and Profend was given.  ?

## 2022-02-15 NOTE — Op Note (Signed)
? ? ?PROCEDURE:  Transcatheter edge to edge mitral valve repair (TEER) ?INDICATION: Severe symptomatic mitral regurgitation (Stage D) ? ?SURGEON:  Sherren Mocha, MD ?Lolly MustacheLenna Sciara, MD ? ?PROCEDURAL DETAILS: General anesthesia is induced.  The patient is prepped and draped.  Baseline transesophageal echo images are obtained and confirm appropriate anatomy for transcatheter edge-to-edge mitral valve repair.  Using vascular ultrasound guidance, the right common femoral vein is accessed via a front wall puncture.  2 Perclose sutures are deployed and an 8 [place] sheath is inserted.  A versa cross wire is advanced into the SVC.  Heparin is administered and a therapeutic ACT is achieved.  Transseptal puncture is performed over the mid posterior portion of the fossa.  The septum is dilated while the MitraClip steerable guide catheter is prepped.  After progressively dilating the right common femoral vein, the 24 French MitraClip steerable guide catheter is inserted and advanced across the interatrial septum.  The dilator and the versa cross wire were carefully removed and the guide tip is appropriately positioned approximately 3 cm across the interatrial septum.  A MitraClip XTW device is prepped per protocol.  The device is inserted through the steerable guide catheter with caution taken to avoid air entrapment.  The MitraClip device is positioned above the mitral valve after applying appropriate curve to the guide catheter.  The MitraClip device is then oriented coaxially with the anterior and posterior leaflets of the mitral valve.  Low tidal volume ventilation is initiated.  The clip device is advanced across the mitral valve and pulled back until capture of both the anterior and posterior leaflets is achieved.  Grippers are dropped and the clip is closed under TEE guidance.  Careful TEE assessment is performed and reduction in mitral valve regurgitation is felt to be appropriate.  Leaflet insertion is  verified using 3D imaging.  The MitraClip device is deployed using normal technique and the clip delivery system is removed.  After full TEE assessment, the procedural result is felt to be inadequate with reduction of mitral regurgitation to 3+.  ? ?Second clip ?Due to inadequate reduction of mitral regurgitation, an additional clip was prepared.  We initially prepped and attempted grasping with a NTW clip.  After several attempts, we could not grasp the anterior and posterior mitral valve leaflets.  Therefore we changed our strategy and prepared a XTW clip. ?A MitraClip XTW device is prepped per protocol.  The device is inserted through the steerable guide catheter with caution taken to avoid air entrapment.  The MitraClip device is positioned above the mitral valve after applying appropriate curve to the guide catheter.  The MitraClip device is then oriented coaxially with the anterior and posterior leaflets of the mitral valve.  Low tidal volume ventilation is initiated.  The clip device is advanced across the mitral valve and pulled back until capture of both the anterior and posterior leaflets is achieved.  Grippers are dropped and the clip is closed under TEE guidance.  Careful TEE assessment is performed and reduction in mitral valve regurgitation is felt to be appropriate.  Leaflet insertion is verified using 3D imaging.  The MitraClip device is deployed using normal technique and the clip delivery system is removed.  After full TEE assessment, the procedural result is felt to be adequate with reduction of mitral regurgitation to 1+.  ? ?PROCEDURE COMPLETION: The steerable guide catheter is pulled back into the right atrium and the interatrial septum is assessed with TEE.  There is no significant septal injury  seen and no right to left shunting.  The guide catheter is removed and the Perclose sutures are tightened.  Protamine is administered. ? ?CONCLUSION: Successful transcatheter edge-to-edge mitral valve  repair under fluoroscopic and echo guidance, reducing baseline 4+ mitral regurgitation to 1+, with clips positioned on A2/P2. ? ?Early Osmond ?02/15/2022 ?11:00 AM ? ?

## 2022-02-15 NOTE — Progress Notes (Signed)
?  [facility]   ?MULTIDISCIPLINARY HEART VALVE TEAM ? ?Patient doing well s/p TEER. She is hemodynamically stable. Groin site is stable. There is minimal blood transfer on groin bandage, marked by RN. No evidence of hematoma. Plan for early ambulation after bedrest completed and hopeful discharge over the next 24-48 hours. She will have repeat echocardiogram early tomorrow AM.  ? ?Kathyrn Drown NP-C ?Structural Heart Team  ?Pager: [phone] ?Phone: [phone] ? ?

## 2022-02-15 NOTE — Progress Notes (Signed)
Upon arrival to cath lab holding, [place] 5, pt lips noted to be swollen,old and new reddish drainage noted on teeth and between lips, per CRNA, lips appeared swollen since before procedure, small abrasion noted to upper lip with reddish drainage, possibly from bite block for TEE, oral care provided, safety maintained ? ?

## 2022-02-15 NOTE — Anesthesia Procedure Notes (Signed)
Procedure Name: Intubation ?Date/Time: 02/15/2022 7:45 AM ?Performed by: Wilburn Cornelia, CRNA ?Pre-anesthesia Checklist: Emergency Drugs available, Patient identified, Suction available, Patient being monitored and Timeout performed ?Patient Re-evaluated:Patient Re-evaluated prior to induction ?Oxygen Delivery Method: Circle system utilized ?Preoxygenation: Pre-oxygenation with 100% oxygen ?Induction Type: IV induction ?Ventilation: Mask ventilation without difficulty ?Laryngoscope Size: Mac and 3 ?Grade View: Grade IV ?Tube type: Oral ?Tube size: 7.0 mm ?Number of attempts: 1 ?Airway Equipment and Method: Stylet ?Placement Confirmation: ETT inserted through vocal cords under direct vision, positive ETCO2, CO2 detector and breath sounds checked- equal and bilateral ?Secured at: 21 cm ?Tube secured with: Tape ?Dental Injury: Teeth and Oropharynx as per pre-operative assessment  ? ? ? ? ?

## 2022-02-15 NOTE — Op Note (Addendum)
?[facility]   ?MULTIDISCIPLINARY HEART TEAM ? ?Date of Procedure:  02/15/2022 ? ?Preoperative Diagnosis: Severe Symptomatic Mitral Regurgitation (Stage D) ? ?Postoperative Diagnosis: Same  ? ?Procedure Performed: ?Ultrasound-guided right transfemoral venous access ?Double PreClose right femoral vein ?Transseptal puncture using Bailess RF needle ?Mitral valve repair with MitraClip XTW x 2 ? ?Surgeon: Blane Ohara, MD  ?Cosurgeon: Lenna Sciara, MD ? ?Echocardiographer: Dr Johnsie Cancel and Dr Gasper Sells ? ?Anesthesiologist: Rochele Pages, MD ? ?Device Implant: Mitraclip XTW, 2 devices ? ?Procedural Indication: ?Severe Non-rheumatic Mitral Regurgitation (Stage D)  ? ?Brief History: ?81 yo woman with NYHA functional class 3 sx's of chronic diastolic HF and severe MR presents for TEER. She has undergone multidisciplinary team review and formal cardiac surgical evaluation, with recommendation to proceed with TEER.  ? ?Echo Findings: ?Preop:  ?Normal LV systolic function ?Severe MR secondary to severe anterior prolapse with flail, Grade 4+ ?Post-op:  ?Unchanged LV systolic function ?Mild residual MR ? ?Procedural Details: ?Prep ?The patient is brought to the cardiac catheterization lab in the fasting state. General anesthesia is induced. The patient is prepped from the groin to chin. Hemodynamics are monitored via a radial artery line.  ? ?Venous Access ?Using ultrasound guidance, the right femoral vein is punctured. Ultrasound images are captured and stored in the patient's chart. The vein is dilated and 2 Perclose devices are deplyed at 10' and 2' positions to 'Preclose' the femoral vein. An 8 Fr sheath is inserted. ? ?Transseptal Puncture ?A Baylis Versacross wire is advanced into the SVC ?A Baylis transseptal dilator is advanced into the SVC, and the VersaCross RF wire is retracted into the dilator  ?The transseptal sheath is retracted into the RA under fluoroscopic and echo guidance to obtain  position on the posterior fossa where echo measurements are made to assure appropriate access to the mitral valve. Once proper position is confirmed by echo, RF energy is delivered and the VersaCross wire is advanced into the LA without resistance. The dilator and sheath are advanced over the wire where proper position is confirmed by echo and pressure measurement ?Weight based IV heparin is administered and a therapeutic ACT > 250 is confirmed ? ?Steerable Guide Catheter Insertion ?The VersaCross wire is positioned at the left upper pulmonary vein ?The femoral vein is progressively dilated and the 24 Fr Steerable guide catheter is inserted and then directed across the interatrial septum over the wire. Position is confirmed approximately 3 cm into the left atrium ?The guide is de-aired  ? ?MitraClip Insertion ?The MitraClip XTW is prepped per protocol and inserted via the introducer into the steerable guide catheter ?The Clip Delivery System (CDS) is advanced under fluoro and echo guidance so that the sleeve markers are evenly spaced on each side of the guide marker ? ?MitraClip Positioning in the Left Atrium (Supravalvular Alignment) ?M-knob is applied to bring the Clip towards the mitral valve. Echo guidance is used to avoid contact with LA structures. ?The Clip arms are opened to 180 degrees ?2D and 3D TEE imaging is performed in multiple planes and the Clip is positioned and aligned above the valve using standard steering techniques  ? ?Entry into the Left Ventricle and Mitral Valve Leaflet Grasp ?The Clip is advanced across the mitral valve into the LV, maintaining proper orientation ?The Clip arms are opened to 120 degrees and the Clip is slowly retracted  ?It is difficult to get leaflet capture even with proper Clip orientation. The Clip is brought into the atrium and reoriented  2 different times. Ultimately, capture of both the anterior and posterior leaflets are visualized by echo and the grippers are  dropped ? ?MitraClip Deployment ?After extensive echo evaluation, reduction in mitral regurgitation is felt to be adequate ?Following standard protocol, the lock line is removed after testing the lock mechanism. The lock is rechecked and is shown to be intact. The MitraClip device is deployed and the clip delivery system is removed under echo guidance with caution taken to avoid contact with LA structures ? ?Placement of Clip #2 ?MitraClip Insertion ?The MitraClip NTW is prepped per protocol and inserted via the introducer into the steerable guide catheter ?The Clip Delivery System (CDS) is advanced under fluoro and echo guidance so that the sleeve markers are evenly spaced on each side of the guide marker ? ?MitraClip Positioning in the Left Atrium (Supravalvular Alignment) ?M-knob is applied to bring the Clip towards the mitral valve. Echo guidance is used to avoid contact with LA structures. ?Low tidal volume respiration is initiated ?The Clip arms are opened to 180 degrees ?2D and 3D TEE imaging is performed in multiple planes and the Clip is positioned and aligned above the valve using standard steering techniques  ? ?Entry into the Left Ventricle and Mitral Valve Leaflet Grasp ?The Clip is advanced across the mitral valve into the LV, maintaining proper orientation and with caution taken to avoid contact with the first Clip ?The Clip arms are opened further and the Clip is slowly retracted  ?I was unable to grasp leaflets with the smaller clip despite multiple attempts. The Clip was shown to be in proper position but couldn't grasp the large flail segment. After multiple attempts, the clip is removed and we elected to go back with a larger clip (XTW). The same procedural steps are taken, The clip is advanced across the valve and adequate leaflet insertion is demonstrate. The clip is closed with excellent reduction in MR and resolution of large V waves.  ? ?MitraClip Deployment ?After extensive echo evaluation,  reduction in mitral regurgitation is felt to be adequate ?Following standard protocol, the MitraClip device is deployed, gripper line and lock line are removed ? ?Device Removal ?The clip delivery system is removed under echo guidance ?The steerable guide catheter is retracted into the right atrium and the interatrial septum is assessed by echo without evidence of right-to-left shunting or large ASD ? ?Hemostasis ?The guide catheter is removed over a 0.035" wire and the Perclose sutures are tightened with complete hemostasis and no evidence of hematoma ? ?Estimated blood loss: minimal ? ?There are no immediate procedural complications. The patient is transferred to the post-procedure recovery area in stable condition.  ? ?Final Conclusion: ?Successful transcatheter edge to edge mitral valve repair (TEER) using 2 Mitraclip XTW devices, reducing MR from 4+ to 1+, both Clips positioned A2/P2. ? ?Sherren Mocha ?02/15/2022 ?11:26 AM ? ? ? ? ? ?  ?

## 2022-02-15 NOTE — Progress Notes (Signed)
?  Echocardiogram ?Echocardiogram Transesophageal has been performed. ? Hassie Bruce ?02/15/2022, 11:11 AM ?

## 2022-02-15 NOTE — TOC Progression Note (Signed)
Transition of Care (TOC) - Progression Note  ? ? ?Patient Details  ?Name: NEITA LANDRIGAN ?MRN: [id number] ?Date of Birth: 10-May-1941 ? ?Transition of Care (TOC) CM/SW Contact  ?Angelita Ingles, RN ?Phone Number:[phone] ? ?02/15/2022, 3:35 PM ? ?Clinical Narrative:    ? ?Transition of Care (TOC) Screening Note ? ? ?Patient Details  ?Name: CHEN HOLZMAN ?Date of Birth: 03-25-1941 ? ? ?Transition of Care (TOC) CM/SW Contact:    ?Angelita Ingles, RN ?Phone Number: ?02/15/2022, 3:35 PM ? ? ? ?Transition of Care Department [facility]) has reviewed patient and no TOC needs have been identified at this time. We will continue to monitor patient advancement through interdisciplinary progression rounds. If new patient transition needs arise, please place a TOC consult. ? ? ? ? ?  ?  ? ?Expected Discharge Plan and Services ?  ?  ?  ?  ?  ?                ?  ?  ?  ?  ?  ?  ?  ?  ?  ?  ? ? ?Social Determinants of Health (SDOH) Interventions ?  ? ?Readmission Risk Interventions ?   ? View : No data to display.  ?  ?  ?  ? ? ?

## 2022-02-15 NOTE — Progress Notes (Addendum)
LOCATION: right RADIAL a-line ? ?DRESSING APPLIED: gauze with tegaderm ? ?SITE UPON ARRIVAL: LEVEL 0, bruising noted to right wrist area/insertion site ? ?SITE AFTER BAND REMOVAL: LEVEL 0 ? ?CIRCULATION SENSATION AND MOVEMENT: +2 radial pulse, + movement ? ?COMMENTS:  a-line removed, manual pressure held for approximately 10 minutes ? ?Post instruction given ?

## 2022-02-15 NOTE — Anesthesia Procedure Notes (Signed)
Arterial Line Insertion ?Start/End3/23/2023 7:05 AM, 02/15/2022 7:15 AM ?Performed by: Darral Dash, DO, anesthesiologist ? Patient location: Pre-op. ?Preanesthetic checklist: patient identified, IV checked, site marked, risks and benefits discussed, surgical consent, monitors and equipment checked, pre-op evaluation, timeout performed and anesthesia consent ?Lidocaine 1% used for infiltration ?Right, radial was placed ?Catheter size: 20 G ?Hand hygiene performed  and maximum sterile barriers used  ?Allen's test indicative of satisfactory collateral circulation ?Attempts: 3 ?Procedure performed using ultrasound guided technique. ?Following insertion, dressing applied and Biopatch. ?Post procedure assessment: normal ? ?Post procedure complications: local hematoma and unsuccessful attempts. ?Additional procedure comments: Attempts x 2 by H. Truman Hayward Immunologist. ? ? ? ?

## 2022-02-15 NOTE — Progress Notes (Signed)
Called to patient's room to assist with rt groin bleeding.  Applied manual pressure to rt groin for 10 min and placed thrombix patch, gauze and tegaderm to the site.  No hematoma, but slight bruising, about 2 inches in width, noted just at and slightly below the site.  RN present at the beginning and ending of my time with the patient and evaluated her rt groin with me.  ?

## 2022-02-15 NOTE — Transfer of Care (Signed)
Immediate Anesthesia Transfer of Care Note ? ?Patient: Crystal Bass ? ?Procedure(s) Performed: MITRAL VALVE REPAIR ?TRANSESOPHAGEAL ECHOCARDIOGRAM (TEE) ? ?Patient Location: Cath Lab ? ?Anesthesia Type:General ? ?Level of Consciousness: awake, alert  and oriented ? ?Airway & Oxygen Therapy: Patient Spontanous Breathing and Patient connected to nasal cannula oxygen ? ?Post-op Assessment: Report given to RN and Post -op Vital signs reviewed and stable ? ?Post vital signs: Reviewed and stable ? ?Last Vitals:  ?Vitals Value Taken Time  ?BP 141/85 02/15/22 1054  ?Temp    ?Pulse 75 02/15/22 1058  ?Resp 18 02/15/22 1058  ?SpO2 98 % 02/15/22 1058  ?Vitals shown include unvalidated device data. ? ?Last Pain:  ?Vitals:  ? 02/15/22 0618  ?TempSrc:   ?PainSc: 0-No pain  ?   ? ?Patients Stated Pain Goal: 0 (02/15/22 0618) ? ?Complications: No notable events documented. ?

## 2022-02-16 ENCOUNTER — Other Ambulatory Visit (HOSPITAL_COMMUNITY): Payer: Self-pay

## 2022-02-16 ENCOUNTER — Inpatient Hospital Stay (HOSPITAL_COMMUNITY): Payer: Medicare PPO

## 2022-02-16 ENCOUNTER — Other Ambulatory Visit: Payer: Self-pay | Admitting: Cardiology

## 2022-02-16 ENCOUNTER — Encounter: Payer: Self-pay | Admitting: Oncology

## 2022-02-16 DIAGNOSIS — Z95818 Presence of other cardiac implants and grafts: Secondary | ICD-10-CM

## 2022-02-16 DIAGNOSIS — I340 Nonrheumatic mitral (valve) insufficiency: Principal | ICD-10-CM

## 2022-02-16 DIAGNOSIS — Z954 Presence of other heart-valve replacement: Secondary | ICD-10-CM

## 2022-02-16 LAB — BASIC METABOLIC PANEL
Anion gap: 6 (ref 5–15)
BUN: 20 mg/dL (ref 8–23)
CO2: 28 mmol/L (ref 22–32)
Calcium: 9.3 mg/dL (ref 8.9–10.3)
Chloride: 103 mmol/L (ref 98–111)
Creatinine, Ser: 1.03 mg/dL — ABNORMAL HIGH (ref 0.44–1.00)
GFR, Estimated: 56 mL/min — ABNORMAL LOW (ref >60)
Glucose, Bld: 139 mg/dL — ABNORMAL HIGH (ref 70–99)
Potassium: 4.1 mmol/L (ref 3.5–5.1)
Sodium: 137 mmol/L (ref 135–145)

## 2022-02-16 LAB — CBC
HCT: 33.3 % — ABNORMAL LOW (ref 36.0–46.0)
Hemoglobin: 10.6 g/dL — ABNORMAL LOW (ref 12.0–15.0)
MCH: 30.4 pg (ref 26.0–34.0)
MCHC: 31.8 g/dL (ref 30.0–36.0)
MCV: 95.4 fL (ref 80.0–100.0)
Platelets: 163 10*3/uL (ref 150–400)
RBC: 3.49 MIL/uL — ABNORMAL LOW (ref 3.87–5.11)
RDW: 14.1 % (ref 11.5–15.5)
WBC: 7.3 10*3/uL (ref 4.0–10.5)
nRBC: 0 % (ref 0.0–0.2)

## 2022-02-16 MED ORDER — CLOPIDOGREL BISULFATE 75 MG PO TABS
75.0000 mg | ORAL_TABLET | Freq: Every day | ORAL | 0 refills | Status: DC
  Filled 2022-02-16: qty 90, 90d supply, fill #0

## 2022-02-16 MED ORDER — CHLORASEPTIC 6-10 MG MT LOZG
1.0000 | LOZENGE | OROMUCOSAL | 0 refills | Status: DC | PRN
  Filled 2022-02-16: qty 100, fill #0

## 2022-02-16 MED ORDER — PANTOPRAZOLE SODIUM 40 MG PO TBEC
40.0000 mg | DELAYED_RELEASE_TABLET | Freq: Every day | ORAL | 2 refills | Status: DC
  Filled 2022-02-16: qty 90, 90d supply, fill #0

## 2022-02-16 NOTE — Progress Notes (Signed)
Mobility Specialist Progress Note  ? ? 02/16/22 1147  ?Mobility  ?Activity Ambulated with assistance in hallway  ?Level of Assistance Standby assist, set-up cues, supervision of patient - no hands on  ?Assistive Device Four wheel walker  ?Distance Ambulated (ft) 340 ft  ?Activity Response Tolerated well  ?$Mobility charge 1 Mobility  ? ?Pre-Mobility: 78 HR, 118/79 BP ?During Mobility: 87 HR ?Post-Mobility: 79 HR ? ?Pt received sitting EOB and agreeable. C/o being a little tired upon return. Left sitting EOB with call bell in reach.  ? ?Hildred Alamin ?Mobility Specialist  ?  ?

## 2022-02-16 NOTE — Discharge Instructions (Signed)
?  Patients taking blood thinners should generally stay away from medicines like ibuprofen, Advil, Motrin, naproxen, and Aleve due to risk of stomach bleeding. You may takeTylenol as directed or talk to your primary doctor about alternatives. ? ?Some studies suggest Prilosec/Omeprazole interacts with Plavix. We changed your Prilosec/Omeprazole to the equivalent dose of Protonix for less chance of interaction. ?

## 2022-02-16 NOTE — Progress Notes (Signed)
CARDIAC REHAB PHASE I  ? ?PRE:  Rate/Rhythm: 82 SR ? ?  BP: sitting 104/71 ? ?  SaO2: 94 RA ? ?MODE:  Ambulation: 240 ft  ? ?POST:  Rate/Rhythm: 90 SR ? ?  BP: sitting 112/67  ? ?  SaO2: 95 RA ? ?Pt needed mod assist to move out of bed "can you help?". Stood with standby assist and slowly walked to BR. Cleaned herself and walked in hall with min assist. Right leg weakness with narrow stance. Sts "I need my shoes". No major c/o. Return to bed for echo. Struggled to move right leg in to bed. She sts she will have Respbid care through her retirement living facility for tonight. Encouraged more walking, preferably with her rollator for support to decrease risk of falling. Gave restrictions, walking guidelines. She is interested in [facility] and will refer to [facility].   ?[id number] ? ?Yves Dill CES, ACSM ?02/16/2022 ?9:26 AM ? ? ? ? ?

## 2022-02-16 NOTE — Discharge Summary (Addendum)
?[facility]   ?MULTIDISCIPLINARY HEART VALVE TEAM ? ?Discharge Summary  ?  ?Patient ID: Crystal Bass ?MRN: [id number]; DOB: 1941-10-19 ? ?Admit date: 02/15/2022 ?Discharge date: 02/16/2022 ? ?Primary Care Provider: [name], MD  ?Primary Cardiologist: [name], MD / Dr. Burt Knack, MD & Dr. Ali Lowe, MD (TEER)  ? ?Discharge Diagnoses  ?  ?Principal Problem: ?  S/P mitral valve clip implantation ?Active Problems: ?  Essential hypertension ?  Chronic obstructive airway disease with asthma ([place]) ?  Thoracic aortic aneurysm without rupture ?  Severe mitral regurgitation ? ?Allergies ?Allergies  ?Allergen Reactions  ? Sulfa Antibiotics Anaphylaxis  ? Sulfonamide Derivatives Anaphylaxis  ? Topamax [Topiramate] Other (See Comments)  ?  Metabolic acidosis   ? Biaxin [Clarithromycin] Other (See Comments)  ?  pericarditis  ? Hydrocodone Other (See Comments)  ?  "hyper and climbing the walls"  ? Motrin [Ibuprofen] Other (See Comments)  ?  headaches  ? Symbicort [Budesonide-Formoterol Fumarate] Other (See Comments)  ?  02/07/15 tremor  ? Percocet [Oxycodone-Acetaminophen] Itching and Rash  ? Tape Itching and Rash  ?  Use paper tape only  ? Tetracyclines & Related Other (See Comments)  ?  "immediate yeast infection"  ? ?Diagnostic Studies/Procedures  ? ?Date of Procedure:                02/15/2022 ?  ?Preoperative Diagnosis:      Severe Symptomatic Mitral Regurgitation (Stage D) ?  ?Postoperative Diagnosis:    Same  ?  ?Procedure Performed: ?Ultrasound-guided right transfemoral venous access ?Double PreClose right femoral vein ?Transseptal puncture using Bailess RF needle ?Mitral valve repair with MitraClip XTW x 2 ?  ?Surgeon: Blane Ohara, MD  ?Cosurgeon: Lenna Sciara, MD ?  ?Echocardiographer: Dr Johnsie Cancel and Dr Gasper Sells ?  ?Anesthesiologist: Rochele Pages, MD ?  ?Device Implant: Mitraclip XTW, 2 devices ?  ?Procedural Indication: ?Severe Non-rheumatic Mitral Regurgitation (Stage D)   ?___________ ?  ?Echo 02/16/22: Completed but pending formal read at the time of discharge  ? ?History of Present Illness   ?  ?Crystal Bass is a 81 y.o. female with a history of of Parkinson's disease complicated by orthostatic hypotension, [name] infection with chronic lung disease, remote mastectomy for breast cancer, and severe mitral regurgitation. ? ?Ms. Segreto lives independently at [facility] retirement facility in [place]. She is very functional at baseline however was referred to Dr. Burt Knack after experiencing worsening fatigue and exertional dyspnea over the last 6 months. She was felt to be a good candidate for TEER therefore further imaging was pursed with R/LHC and TEE> R/LHC showed no CAD. TEE with severe mitral regurgitation with marked prolapse of the A2 scallop of the anterior mitral leaflet and severe associated mitral regurgitation (Barlow valve).  She did not have any clear evidence of a flail leaflet.. There was clear demonstration of severe mitral regurgitation with pulmonary vein flow reversal and large V waves seen at the time of cardiac catheterization.   ? ?The patient has been evaluated by the multidisciplinary valve team and felt to have severe, symptomatic mitral regurgitation and to be a suitable candidate for TEER, which was set up for 02/16/22.    ? ?Hospital Course  ?   ?Severe mitral regurgitation: s/p successful TEER using 2 Mitraclip XTW devices, reducing MR from 4+ to 1+, both Clips positioned A2/P2. ?Post operative echo pending. Groin site is stable. Medication plan will be ASA and Plavix for at least 3 months then monotherapy thereafter. SBE  discussed and will be RX'ed at follow up next week. She has ambulated with CRI with recommendations to continue with CRII. Post procedure site care and restrictions reviewed with patient understanding.  ? ?Parkinson's disease complicated by orthostatic hypotension: BP has been stable, 115/67. No changes needed at this time.  ? ?[name]  infection with chronic lung disease: Seen prior to TEER and felt to be high risk to proceed with procedure. Patient did very well with no complications.  ? ?Consultants: None   ? ?The patient was seen and examined by Dr. Burt Knack who feels that she is stable and ready for discharge today, 02/16/22.  ?_____________ ? ?Discharge Vitals ?Blood pressure 118/79, pulse 74, temperature 98.4 ?F (36.9 ?C), temperature source Oral, resp. rate 17, height '5\' 2"'$  (1.575 m), weight 54 kg, SpO2 97 %.  Danley Danker Weights  ? 02/15/22 0612  ?Weight: 54 kg  ? ?General: Well developed, well nourished, NAD ?Cardiovascular: RRR with S1 S2. + murmur ?Extremities: No edema. ?Neuro: Alert and oriented. No focal deficits. No facial asymmetry. MAE spontaneously. ?Psych: Responds to questions appropriately with normal affect.   ? ?Labs & Radiologic Studies  ?  ?CBC ?Recent Labs  ?  02/15/22 ?1302 02/16/22 ?0030  ?WBC 6.8 7.3  ?HGB 12.1 10.6*  ?HCT 37.3 33.3*  ?MCV 94.7 95.4  ?PLT 155 163  ? ?Basic Metabolic Panel ?Recent Labs  ?  02/15/22 ?1302 02/16/22 ?0030  ?NA  --  137  ?K  --  4.1  ?CL  --  103  ?CO2  --  28  ?GLUCOSE  --  139*  ?BUN  --  20  ?CREATININE 0.74 1.03*  ?CALCIUM  --  9.3  ? ?Liver Function Tests ?No results for input(s): AST, ALT, ALKPHOS, BILITOT, PROT, ALBUMIN in the last 72 hours. ?No results for input(s): LIPASE, AMYLASE in the last 72 hours. ?Cardiac Enzymes ?No results for input(s): CKTOTAL, CKMB, CKMBINDEX, TROPONINI in the last 72 hours. ?BNP ?Invalid input(s): POCBNP ?D-Dimer ?No results for input(s): DDIMER in the last 72 hours. ?Hemoglobin A1C ?No results for input(s): HGBA1C in the last 72 hours. ?Fasting Lipid Panel ?No results for input(s): CHOL, HDL, LDLCALC, TRIG, CHOLHDL, LDLDIRECT in the last 72 hours. ?Thyroid Function Tests ?No results for input(s): TSH, T4TOTAL, T3FREE, THYROIDAB in the last 72 hours. ? ?Invalid input(s): FREET3 ?_____________  ?DG Chest 2 View ? ?Result Date: 02/12/2022 ?CLINICAL DATA:   81 year old female with preoperative chest x-ray EXAM: CHEST - 2 VIEW COMPARISON:  02/09/2022, 02/03/2022, 03/25/2020, chest CT 02/03/2022 FINDINGS: Cardiomediastinal silhouette unchanged in size and contour. Right rotation of the patient somewhat limits exam, however, there is persisting mixed interstitial and airspace opacity of the right upper lung, with up lifting of the minor fissure. Slight improvement compared to the prior. Surgical changes in the upper right lung. Stigmata of emphysema, with increased retrosternal airspace, flattened hemidiaphragms, increased AP diameter, and hyperinflation on the AP view. No evidence of left-sided new airspace disease. Coarsened interstitial markings bilaterally. Degenerative changes the spine.  No acute displaced fracture IMPRESSION: Redemonstration of mixed interstitial and airspace opacity in the upper right lung, superimposed on postoperative changes. Given that this is new when compared to the plain film of April 2021, and that there is slight interval improvement in aeration when compared to 02/03/2022, the findings are most compatible with a resolving pneumonia, and not simply postoperative volume loss/atelectasis. Chronic lung changes and emphysema. Electronically Signed   By: Corrie Mckusick D.O.   On: 02/12/2022 11:42  ? ?  DG Chest 2 View ? ?Result Date: 02/09/2022 ?CLINICAL DATA:  pre-op/ aytpical pneumonia EXAM: CHEST - 2 VIEW COMPARISON:  02/03/2022 FINDINGS: Cardiomediastinal silhouette and pulmonary vasculature are within normal limits. Right upper lung opacity is unchanged from prior examination. Surgical sutures noted in the right upper lung. No new lung opacities are identified. Lungs are hyperexpanded. IMPRESSION: Unchanged right upper lung airspace opacity. Electronically Signed   By: Miachel Roux M.D.   On: 02/09/2022 15:07  ? ?CT Chest W Contrast ? ?Result Date: 02/03/2022 ?CLINICAL DATA:  Pneumonia, complication suspected, xray done EXAM: CT CHEST WITH  CONTRAST TECHNIQUE: Multidetector CT imaging of the chest was performed during intravenous contrast administration. RADIATION DOSE REDUCTION: This exam was performed according to the departmental dose-optimization progr

## 2022-02-19 ENCOUNTER — Telehealth: Payer: Self-pay

## 2022-02-19 ENCOUNTER — Other Ambulatory Visit: Payer: Self-pay | Admitting: Physician Assistant

## 2022-02-19 MED ORDER — FLUCONAZOLE 150 MG PO TABS: 150.0000 mg | ORAL_TABLET | Freq: Once | ORAL | 1 refills | Status: AC

## 2022-02-19 NOTE — Progress Notes (Signed)
Had yeast infection after IV abx. Sent in diflucan.  ? ?Angelena Form PA-C  [facility]  ? ?

## 2022-02-19 NOTE — Telephone Encounter (Signed)
?  [facility]   ? ?Contacted the patient regarding discharge from [facility] on 02/16/2022 ? ?The patient understands to follow up with Kathyrn Drown on 02/26/2022. ? ?The patient understands discharge instructions? Yes ? ?The patient understands medications and regimen? Yes  ? ?Ms Torain she feels great with the exception that she has a yeast infection due the antibiotics she has been taking lately (for PNA and IV during procedure).  ?Discussed with Nell Range, who called in Diflucan 150 mg x1 dose. ? ?The patient understands to call with any questions or concerns prior to scheduled visit.  ? ?

## 2022-02-22 NOTE — Progress Notes (Signed)
?HEART AND VASCULAR CENTER   ?[place] ?                                    ?Cardiology Office Note:   ? ?Date:  02/26/2022  ? ?ID:  Crystal Bass, DOB [birth year], MRN [id number] ? ?PCP:  [name], MD  ?[place] Cardiologist:  [name], MD  ?[facility] Electrophysiologist:  None  ? ?Referring MD: [name], MD  ? ?Chief Complaint  ?Patient presents with  ? Follow-up  ?  1 week s/p TEER  ? ?History of Present Illness:   ? ?Crystal Bass is a 81 y.o. female with a hx of Parkinson's disease complicated by orthostatic hypotension, [name] infection with chronic lung disease, remote mastectomy for breast cancer, and severe mitral regurgitation. ?  ?Ms. Zale lives independently at [facility] retirement facility in [place]. She is very functional at baseline however was referred to Dr. Burt Knack after experiencing worsening fatigue and exertional dyspnea over the last 6 months. She was felt to be a good candidate for TEER therefore further imaging was pursed with R/LHC and TEE> R/LHC showed no CAD. TEE with severe mitral regurgitation with marked prolapse of the A2 scallop of the anterior mitral leaflet and severe associated mitral regurgitation (Barlow valve).  She did not have any clear evidence of a flail leaflet.. There was clear demonstration of severe mitral regurgitation with pulmonary vein flow reversal and large V waves seen at the time of cardiac catheterization.   ?  ?The patient was evaluated by the multidisciplinary valve team and felt to have severe, symptomatic mitral regurgitation and to be a suitable candidate for TEER, which was set up for 02/16/22.    ? ?She underwent successful TEER using 2 Mitraclip XTW devices, reducing MR from 4+ to 1+, both Clips positioned A2/P2. Post operative echo which appears to still be pending however this was reviewed by Dr. Burt Knack prior to her discharge. There   intact with what appears to be mild residual MR.  The mean transmitral gradient is 5 mmHg. There is no pericardial effusionI will have reading MD read report and post final results. Plan was for ASA and Plavix for at least 3 months then monotherapy thereafter. SBE discussed with plan to RX today.  ? ?Today she comes with her brother. She speaks to me while walking into the room and she is very winded with speaking. She reports that she was doing very well until yesterday before her walk. She continued to go on her walk but was so SOB to the point that she stopped her walk and almost called the office out of concern. She has no HF symptoms including LE edema, orthopnea, dizziness, or syncope. Denies chest pain. ? ?Dr. Burt Knack is present in the office today. He came to listen to her and also agrees that her murmur is more prominent. It appears that there is availability at the [place] office for an echocardiogram tomorrow.  ? ?Past Medical History:  ?Diagnosis Date  ? Aneurysm of aorta (HCC)   ? 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  ? Anginal pain ([place])   ? Aortic root aneurysm ([place])   ? Arthritis   ? Asthma   ? Breast cancer ([place]) 2002  ? infiltrative ductal carcinoma   ? Cataract 2019  ? COPD (chronic obstructive pulmonary disease) ([place]) 07/2007  ? Ductal carcinoma  of breast, estrogen receptor positive, stage 1 ([place]) 09/16/2012  ? Dyspnea   ? Endometriosis   ? GERD (gastroesophageal reflux disease)   ? Hypercalcemia 09/14/2013  ? Hypertension   ? Lesion of breast 1992  ? right, benign  ? Lung disease   ? secondary to [name] infection  ? Mastalgia 05/1994  ? Osteoporosis, post-menopausal 03/14/2012  ? Ovarian tumor of borderline malignancy, right 2004  ? Pancreatitis   ? secondary to Cholelithiasis  ? Parkinson disease ([place]) 02/10/2015  ? Post-thoracotomy pain syndrome   ? Pseudomonas pneumonia ([place]) 03/2008  ? Status post implantation of mitral valve leaflet clip 02/15/2022  ? s/p XTW MitraClip x2 by Dr. Burt Knack in the A2/P2 position  ? Traumatic closed fracture of  distal clavicle with minimal displacement, left, initial encounter 06/2021  ? [facility]  ? Uterine fibroid   ? Vitamin D deficiency   ? ? ?Past Surgical History:  ?Procedure Laterality Date  ? ABDOMINAL HYSTERECTOMY    ? & BSO for Mucinous borderline tumor of R ovary 2004  ? APPENDECTOMY  2004  ? BREAST BIOPSY  08/2004  ? right breast-benign  ? BREAST LUMPECTOMY  1992  ? benign  ? BUBBLE STUDY  12/27/2021  ? Procedure: BUBBLE STUDY;  Surgeon: Geralynn Rile, MD;  Location: [place];  Service: Cardiovascular;;  ? CARDIAC CATHETERIZATION  2007  ? essentially negation for significant CAD  ? CHOLECYSTECTOMY  2001  ? COLONOSCOPY  2014  ? Dr [name] , due 2019  ? COLONOSCOPY  09/2018  ? 2 TA removed, int hem, no f/u needed (Dr [name])   ? ESOPHAGOGASTRODUODENOSCOPY  09/2018  ? GERD with esophagitis and stricture dilated, antral gastritis negative for H pylori [name])  ? LUNG BIOPSY  2002  ? [name], Dr [name]  ? MASTECTOMY MODIFIED RADICAL Left 2002  ? oral chemotheraphy (tamoxifen then Armidex) no radiation, Dr.[name]  ? MITRAL VALVE REPAIR N/A 02/15/2022  ? Procedure: MITRAL VALVE REPAIR;  Surgeon: Sherren Mocha, MD;  Location: [place] [facility];  Service: Cardiovascular;  Laterality: N/A;  ? RIGHT/LEFT HEART CATH AND CORONARY ANGIOGRAPHY Bilateral 11/14/2021  ? Procedure: RIGHT/LEFT HEART CATH AND CORONARY ANGIOGRAPHY;  Surgeon: Wellington Hampshire, MD;  Location: [place] [facility];  Service: Cardiovascular;  Laterality: Bilateral;  ? TEE WITHOUT CARDIOVERSION N/A 12/29/2018  ? Procedure: TRANSESOPHAGEAL ECHOCARDIOGRAM (TEE);  Surgeon: Wellington Hampshire, MD;  Location: [facility];  Service: Cardiovascular;  Laterality: N/A;  ? TEE WITHOUT CARDIOVERSION N/A 12/27/2021  ? Procedure: TRANSESOPHAGEAL ECHOCARDIOGRAM (TEE);  Surgeon: Geralynn Rile, MD;  Location: [place];  Service: Cardiovascular;  Laterality: N/A;  ? TEE WITHOUT CARDIOVERSION N/A 02/15/2022  ? Procedure: TRANSESOPHAGEAL ECHOCARDIOGRAM  (TEE);  Surgeon: Sherren Mocha, MD;  Location: [place] CV [facility];  Service: Cardiovascular;  Laterality: N/A;  ? TONSILLECTOMY  1946  ? ? ?Current Medications: ?Current Meds  ?Medication Sig  ? acetaminophen (TYLENOL) 500 MG tablet Take 2 tablets (1,000 mg total) by mouth 2 (two) times daily as needed. (Patient taking differently: Take 1,000 mg by mouth every 6 (six) hours as needed for mild pain or moderate pain.)  ? albuterol (VENTOLIN HFA) 108 (90 Base) MCG/ACT inhaler Inhale 2 puffs into the lungs every 6 (six) hours as needed for wheezing or shortness of breath.  ? amLODipine (NORVASC) 2.5 MG tablet TAKE 1 TABLET BY MOUTH ONCE A DAY  ? amoxicillin (AMOXIL) 500 MG capsule Take 4 capsules (2,000 mg total) by mouth as directed. Take 4 tablets 1  hour prior to dental work, including cleanings.  ? aspirin 81 MG tablet Take 1 tablet (81 mg total) by mouth daily.  ? benzocaine-menthol (CHLORASEPTIC) 6-10 MG lozenge Take 1 lozenge by mouth as needed for sore throat.  ? Carbidopa-Levodopa ER (SINEMET CR) 25-100 MG tablet controlled release TAKE 2 TABLET BY MOUTH AT 9 AM, 1 TAB AT1 PM AND 1 TAB AT 5 PM  ? clonazePAM (KLONOPIN) 0.5 MG tablet TAKE 1/2 A TABLET BY MOUTH AT BEDTIME (Patient taking differently: Take 0.5 mg by mouth at bedtime.)  ? clopidogrel (PLAVIX) 75 MG tablet Take 1 tablet (75 mg total) by mouth daily with breakfast.  ? EPINEPHrine 0.3 mg/0.3 mL IJ SOAJ injection Inject 0.3 mg into the muscle as needed for anaphylaxis.  ? fexofenadine (ALLEGRA) 180 MG tablet Take 180 mg by mouth daily.  ? Fluticasone-Umeclidin-Vilant (TRELEGY ELLIPTA) 100-62.5-25 MCG/ACT AEPB Inhale 1 puff into the lungs daily.  ? gabapentin ([name]) 300 MG capsule TAKE 2 CAPSULES BY MOUTH 3 TIMES DAILY  ? pantoprazole (PROTONIX) 40 MG tablet Take 1 tablet (40 mg total) by mouth daily.  ?  ? ?Allergies:   Sulfa antibiotics, Sulfonamide derivatives, Topamax [topiramate], Biaxin [clarithromycin], Hydrocodone, Motrin [ibuprofen],  Symbicort [budesonide-formoterol fumarate], Percocet [oxycodone-acetaminophen], Tape, and Tetracyclines & related  ? ?Social History  ? ?Socioeconomic History  ? Marital status: Widowed  ?  Spouse name: Not

## 2022-02-23 ENCOUNTER — Other Ambulatory Visit (HOSPITAL_COMMUNITY): Payer: Self-pay

## 2022-02-26 ENCOUNTER — Ambulatory Visit: Payer: Medicare PPO | Admitting: Cardiology

## 2022-02-26 VITALS — BP 142/74 | HR 71 | Ht 62.0 in | Wt 121.4 lb

## 2022-02-26 DIAGNOSIS — Z9889 Other specified postprocedural states: Secondary | ICD-10-CM | POA: Diagnosis not present

## 2022-02-26 DIAGNOSIS — I340 Nonrheumatic mitral (valve) insufficiency: Secondary | ICD-10-CM | POA: Diagnosis not present

## 2022-02-26 DIAGNOSIS — I10 Essential (primary) hypertension: Secondary | ICD-10-CM

## 2022-02-26 DIAGNOSIS — Z95818 Presence of other cardiac implants and grafts: Secondary | ICD-10-CM

## 2022-02-26 MED ORDER — AMOXICILLIN 500 MG PO CAPS
2000.0000 mg | ORAL_CAPSULE | ORAL | 12 refills | Status: DC
Start: 2022-02-26 — End: 2022-08-24

## 2022-02-26 NOTE — Patient Instructions (Signed)
Medication Instructions:  ?Your physician recommends that you continue on your current medications as directed. Please refer to the Current Medication list given to you today.  ?*If you need a refill on your cardiac medications before your next appointment, please call your pharmacy* ? ? ?Lab Work: ?NONE ?If you have labs (blood work) drawn today and your tests are completely normal, you will receive your results only by: ?MyChart Message (if you have MyChart) OR ?A paper copy in the mail ?If you have any lab test that is abnormal or we need to change your treatment, we will call you to review the results. ? ? ?Testing/Procedures: ?NONE ? ? ?Follow-Up: ?At [facility], you and your health needs are our priority.  As part of our continuing mission to provide you with exceptional heart care, we have created designated Provider Care Teams.  These Care Teams include your primary Cardiologist (physician) and Advanced Practice Providers (APPs -  Physician Assistants and Nurse Practitioners) who all work together to provide you with the care you need, when you need it. ? ?We recommend signing up for the patient portal called "MyChart".  Sign up information is provided on this After Visit Summary.  MyChart is used to connect with patients for Virtual Visits (Telemedicine).  Patients are able to view lab/test results, encounter notes, upcoming appointments, etc.  Non-urgent messages can be sent to your provider as well.   ?To learn more about what you can do with MyChart, go to [email or web address].   ? ?Your next appointment:   ?KEEP SCHEDULED FOLLOW-UP ?

## 2022-02-27 ENCOUNTER — Telehealth (HOSPITAL_COMMUNITY): Payer: Self-pay

## 2022-02-27 ENCOUNTER — Ambulatory Visit (HOSPITAL_BASED_OUTPATIENT_CLINIC_OR_DEPARTMENT_OTHER)
Admission: RE | Admit: 2022-02-27 | Discharge: 2022-02-27 | Disposition: A | Discharge status: Home / Self Care | Payer: Medicare PPO | Source: Ambulatory Visit | Attending: Cardiology | Admitting: Cardiology

## 2022-02-27 ENCOUNTER — Other Ambulatory Visit (HOSPITAL_COMMUNITY): Payer: Self-pay

## 2022-02-27 DIAGNOSIS — Z95818 Presence of other cardiac implants and grafts: Secondary | ICD-10-CM | POA: Insufficient documentation

## 2022-02-27 DIAGNOSIS — R011 Cardiac murmur, unspecified: Secondary | ICD-10-CM | POA: Diagnosis not present

## 2022-02-27 DIAGNOSIS — Z9889 Other specified postprocedural states: Secondary | ICD-10-CM | POA: Insufficient documentation

## 2022-02-27 DIAGNOSIS — I340 Nonrheumatic mitral (valve) insufficiency: Secondary | ICD-10-CM | POA: Diagnosis not present

## 2022-02-27 DIAGNOSIS — Z952 Presence of prosthetic heart valve: Secondary | ICD-10-CM

## 2022-02-27 LAB — ECHOCARDIOGRAM COMPLETE
AR max vel: 2.93 cm2
AV Peak grad: 7 mmHg
Ao pk vel: 1.32 m/s
Area-P 1/2: 2.67 cm2
Calc EF: 53.5 %
Height: 62 in
MV M vel: 4.37 m/s
MV Peak grad: 76.4 mmHg
MV VTI: 1.31 cm2
S' Lateral: 2.3 cm
Single Plane A2C EF: 54.1 %
Single Plane A4C EF: 51.5 %
Weight: 1904 oz

## 2022-02-27 NOTE — Telephone Encounter (Signed)
Pharmacy Transitions of Care Follow-up Telephone Call ? ?Date of discharge: 02/16/2022  ?Discharge Diagnosis: TEER ? ?How have you been since you were released from the hospital? Reported SoB  - had ultrasound and ECHO - doing okay now  ? ?Medication changes made at discharge: ?START taking: ?Chloraseptic (benzocaine-menthol)  ?clopidogrel (PLAVIX)  ?pantoprazole (Protonix)  ?STOP taking: ?omeprazole 20 MG capsule (PRILOSEC)  ? ?Medication changes verified by the patient? yes  ?  ? ?Medication Accessibility: ? ?Home Pharmacy: [facility] [place], [place]  ? ?Was the patient provided with refills on discharged medications? Yes   ? ?Have all prescriptions been transferred from [facility] to home pharmacy? yes  ? ?Is the patient able to afford medications? yes ?Notable copays: $12 for 90-day supply ?  ? ?Medication Review: ? ?CLOPIDOGREL (PLAVIX) ?Clopidogrel 75 mg once daily.  ?- Per MD, will continue DAPT for 3 months then monotherapy thereafter. ?- Reviewed potential DDIs with patient  ?- Advised patient of medications to avoid (NSAIDs, ASA)  ?- Educated that Tylenol (acetaminophen) will be the preferred analgesic to prevent risk of bleeding  ?- Emphasized importance of monitoring for signs and symptoms of bleeding (abnormal bruising, prolonged bleeding, nose bleeds, bleeding from gums, discolored urine, black tarry stools)  ?- Advised patient to alert all providers of anticoagulation therapy prior to starting a new medication or having a procedure  ? ? ?Follow-up Appointments: ? ?[facility] f/u appt confirmed?  Scheduled to see cardiology on 03/19/2022 @ 12:30pm.  ? ?If their condition worsens, is the pt aware to call PCP or go to the Emergency Dept.? yes ? ?Final Patient Assessment: ?-Pt is doing okay. ?-Pt verbalized understanding of Plavix.  ?-Pt has post discharge appointment and refill of Protonix sent to [facility]. Patient to ask for new script of Plavix if they continue. ? ?Cyd Silence   ?Pharm D. Candidate  ?[place] ? ? ? ?

## 2022-02-27 NOTE — Progress Notes (Signed)
?  Echocardiogram ?2D Echocardiogram has been performed. ? ?Crystal Bass ?02/27/2022, 9:50 AM ?

## 2022-02-28 ENCOUNTER — Other Ambulatory Visit: Payer: Self-pay

## 2022-02-28 DIAGNOSIS — I340 Nonrheumatic mitral (valve) insufficiency: Secondary | ICD-10-CM

## 2022-02-28 DIAGNOSIS — Z9889 Other specified postprocedural states: Secondary | ICD-10-CM

## 2022-03-12 ENCOUNTER — Encounter: Payer: Medicare PPO | Attending: Cardiovascular Disease | Admitting: *Deleted

## 2022-03-12 DIAGNOSIS — Z95818 Presence of other cardiac implants and grafts: Secondary | ICD-10-CM | POA: Insufficient documentation

## 2022-03-12 DIAGNOSIS — Z9889 Other specified postprocedural states: Secondary | ICD-10-CM | POA: Insufficient documentation

## 2022-03-12 NOTE — Progress Notes (Signed)
Initial telephone orientation completed. Diagnosis can be found in [facility] 3/23. EP orientation scheduled for Wednesday 4/26 at 1:30pm ?

## 2022-03-16 NOTE — Progress Notes (Signed)
?HEART AND VASCULAR CENTER   ?[place] ?                                    ?Cardiology Office Note:   ? ?Date:  03/19/2022  ? ?ID:  Crystal Bass, DOB [birth year], MRN [id number] ? ?PCP:  [name], MD  ?[facility] Cardiologist:  [name], MD/Dr. Burt Knack, MD (TEER) ?[place] Electrophysiologist:  None  ? ?Referring MD: [name], MD  ? ?Chief Complaint  ?Patient presents with  ? Follow-up  ?  1 month s/p TEER  ? ?History of Present Illness:   ? ?Crystal Bass is a 81 y.o. female with a hx of Parkinson's disease complicated by orthostatic hypotension, [name] infection with chronic lung disease, remote mastectomy for breast cancer, and severe mitral regurgitation. ?  ?Crystal Bass lives independently at [facility] retirement facility in [place]. She is very functional at baseline however was referred to Dr. Burt Knack after experiencing worsening fatigue and exertional dyspnea over the last 6 months. She was felt to be a good candidate for TEER therefore further imaging was pursed with R/LHC and TEE> R/LHC showed no CAD. TEE with severe mitral regurgitation with marked prolapse of the A2 scallop of the anterior mitral leaflet and severe associated mitral regurgitation (Barlow valve).  She did not have any clear evidence of a flail leaflet.. There was clear demonstration of severe mitral regurgitation with pulmonary vein flow reversal and large V waves seen at the time of cardiac catheterization.   ?  ?The patient was evaluated by the multidisciplinary valve team and felt to have severe, symptomatic mitral regurgitation and to be a suitable candidate for TEER, which was set up for 02/16/22.    ?  ?She underwent successful TEER using 2 Mitraclip XTW devices, reducing MR from 4+ to 1+, both Clips positioned A2/P2. Post operative echo was reviewed by Dr. Burt Knack prior to her discharge which showed mild residual MR with a mean transmitral gradient at 5 mmHg and no  pericardial effusion. Plan was for ASA and Plavix for at least 3 months (05/18/22) then monotherapy thereafter.  ?  ?She was last seen by myself and was very SOB with minimal exertion, concerning for possible clip detachment. Her murmur was much more pronounced although with no other overt HF symptoms. Dr. Burt Knack saw her and agreed that she needed an urgent echocardiogram. This was performed 02/27/22 which showed stable clip placement. She returns today for follow up and remains rather SOB with minimal exertion. She continues to walk 1/2 mile, twice daily. This is sometimes tolerated and sometimes very difficult. She was walking the other day and wore her pulse oximeter which dropped into the 40's while walking. She says it came back to baseline quickly after she stopped, into the 90's. She denies LE edema, and in fact feels that she breaths better with laying flat at night. She continues with Trelegy. She saw pulmonary for clearance prior to her Mitraclip procedure with no changes. Plan was to follow with Dr. Chase Caller in 2-3 months. I would like her to follow closely with them as her symptoms have not changed much at all. Echocardiogram today shows moderate (at least) residual MR with intact clips per my view. She also is having some intermittent palpitations, more frequent (or more noticeable) at night. Episodes have only occurred 2-3 times over the last 3 weeks. Short in duration. She  feels like there is an extra beat. No dizziness or syncope. She is tolerating her medications well with no bleeding. Does have arm bruising.  ? ?Past Medical History:  ?Diagnosis Date  ? Aneurysm of aorta (HCC)   ? 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  ? Anginal pain ([place])   ? Aortic root aneurysm ([place])   ? Arthritis   ? Asthma   ? Breast cancer ([place]) 2002  ? infiltrative ductal carcinoma   ? Cataract 2019  ? COPD (chronic obstructive pulmonary disease) ([place]) 07/2007  ? Ductal carcinoma of breast, estrogen receptor positive, stage 1  (HCC) 09/16/2012  ? Dyspnea   ? Endometriosis   ? GERD (gastroesophageal reflux disease)   ? Hypercalcemia 09/14/2013  ? Hypertension   ? Lesion of breast 1992  ? right, benign  ? Lung disease   ? secondary to [name] infection  ? Mastalgia 05/1994  ? Osteoporosis, post-menopausal 03/14/2012  ? Ovarian tumor of borderline malignancy, right 2004  ? Pancreatitis   ? secondary to Cholelithiasis  ? Parkinson disease ([place]) 02/10/2015  ? Post-thoracotomy pain syndrome   ? Pseudomonas pneumonia ([place]) 03/2008  ? Status post implantation of mitral valve leaflet clip 02/15/2022  ? s/p XTW MitraClip x2 by Dr. Burt Knack in the A2/P2 position  ? Traumatic closed fracture of distal clavicle with minimal displacement, left, initial encounter 06/2021  ? [facility]  ? Uterine fibroid   ? Vitamin D deficiency   ? ? ?Past Surgical History:  ?Procedure Laterality Date  ? ABDOMINAL HYSTERECTOMY    ? & BSO for Mucinous borderline tumor of R ovary 2004  ? APPENDECTOMY  2004  ? BREAST BIOPSY  08/2004  ? right breast-benign  ? BREAST LUMPECTOMY  1992  ? benign  ? BUBBLE STUDY  12/27/2021  ? Procedure: BUBBLE STUDY;  Surgeon: Geralynn Rile, MD;  Location: [place];  Service: Cardiovascular;;  ? CARDIAC CATHETERIZATION  2007  ? essentially negation for significant CAD  ? CHOLECYSTECTOMY  2001  ? COLONOSCOPY  2014  ? Dr [name] , due 2019  ? COLONOSCOPY  09/2018  ? 2 TA removed, int hem, no f/u needed (Dr [name])   ? ESOPHAGOGASTRODUODENOSCOPY  09/2018  ? GERD with esophagitis and stricture dilated, antral gastritis negative for H pylori [name])  ? LUNG BIOPSY  2002  ? [name], Dr [name]  ? MASTECTOMY MODIFIED RADICAL Left 2002  ? oral chemotheraphy (tamoxifen then Armidex) no radiation, Dr.[name]  ? MITRAL VALVE REPAIR N/A 02/15/2022  ? Procedure: MITRAL VALVE REPAIR;  Surgeon: Sherren Mocha, MD;  Location: [place] [facility];  Service: Cardiovascular;  Laterality: N/A;  ? RIGHT/LEFT HEART CATH AND CORONARY ANGIOGRAPHY Bilateral  11/14/2021  ? Procedure: RIGHT/LEFT HEART CATH AND CORONARY ANGIOGRAPHY;  Surgeon: Wellington Hampshire, MD;  Location: [place] [facility];  Service: Cardiovascular;  Laterality: Bilateral;  ? TEE WITHOUT CARDIOVERSION N/A 12/29/2018  ? Procedure: TRANSESOPHAGEAL ECHOCARDIOGRAM (TEE);  Surgeon: Wellington Hampshire, MD;  Location: [facility];  Service: Cardiovascular;  Laterality: N/A;  ? TEE WITHOUT CARDIOVERSION N/A 12/27/2021  ? Procedure: TRANSESOPHAGEAL ECHOCARDIOGRAM (TEE);  Surgeon: Geralynn Rile, MD;  Location: [place];  Service: Cardiovascular;  Laterality: N/A;  ? TEE WITHOUT CARDIOVERSION N/A 02/15/2022  ? Procedure: TRANSESOPHAGEAL ECHOCARDIOGRAM (TEE);  Surgeon: Sherren Mocha, MD;  Location: [place] [facility];  Service: Cardiovascular;  Laterality: N/A;  ? TONSILLECTOMY  1946  ? ? ?Current Medications: ?Current Meds  ?Medication Sig  ? acetaminophen (TYLENOL) 500 MG  tablet Take 2 tablets (1,000 mg total) by mouth 2 (two) times daily as needed.  ? albuterol (VENTOLIN HFA) 108 (90 Base) MCG/ACT inhaler Inhale 2 puffs into the lungs every 6 (six) hours as needed for wheezing or shortness of breath.  ? amLODipine (NORVASC) 2.5 MG tablet TAKE 1 TABLET BY MOUTH ONCE A DAY  ? amoxicillin (AMOXIL) 500 MG capsule Take 4 capsules (2,000 mg total) by mouth as directed. Take 4 tablets 1 hour prior to dental work, including cleanings.  ? aspirin 81 MG tablet Take 1 tablet (81 mg total) by mouth daily.  ? benzocaine-menthol (CHLORASEPTIC) 6-10 MG lozenge Take 1 lozenge by mouth as needed for sore throat.  ? Carbidopa-Levodopa ER (SINEMET CR) 25-100 MG tablet controlled release TAKE 2 TABLET BY MOUTH AT 9 AM, 1 TAB AT1 PM AND 1 TAB AT 5 PM  ? clonazePAM (KLONOPIN) 0.5 MG tablet TAKE 1/2 A TABLET BY MOUTH AT BEDTIME  ? clopidogrel (PLAVIX) 75 MG tablet Take 1 tablet (75 mg total) by mouth daily with breakfast.  ? EPINEPHrine 0.3 mg/0.3 mL IJ SOAJ injection Inject 0.3 mg into the muscle as needed for anaphylaxis.   ? fexofenadine (ALLEGRA) 180 MG tablet Take 180 mg by mouth daily.  ? Fluticasone-Umeclidin-Vilant (TRELEGY ELLIPTA) 100-62.5-25 MCG/ACT AEPB Inhale 1 puff into the lungs daily.  ? gabapentin ([name]) 300 MG

## 2022-03-19 ENCOUNTER — Ambulatory Visit (INDEPENDENT_AMBULATORY_CARE_PROVIDER_SITE_OTHER): Payer: Medicare PPO

## 2022-03-19 ENCOUNTER — Ambulatory Visit (HOSPITAL_COMMUNITY): Payer: Medicare PPO | Attending: Internal Medicine

## 2022-03-19 ENCOUNTER — Other Ambulatory Visit: Payer: Self-pay | Admitting: Cardiology

## 2022-03-19 ENCOUNTER — Ambulatory Visit: Payer: Medicare PPO | Admitting: Cardiology

## 2022-03-19 ENCOUNTER — Other Ambulatory Visit (HOSPITAL_COMMUNITY): Payer: Medicare PPO

## 2022-03-19 VITALS — BP 100/60 | HR 69 | Ht 62.0 in | Wt 124.0 lb

## 2022-03-19 DIAGNOSIS — G20 Parkinson's disease: Secondary | ICD-10-CM

## 2022-03-19 DIAGNOSIS — I340 Nonrheumatic mitral (valve) insufficiency: Secondary | ICD-10-CM | POA: Insufficient documentation

## 2022-03-19 DIAGNOSIS — Z9889 Other specified postprocedural states: Secondary | ICD-10-CM | POA: Diagnosis not present

## 2022-03-19 DIAGNOSIS — R002 Palpitations: Secondary | ICD-10-CM | POA: Diagnosis not present

## 2022-03-19 DIAGNOSIS — Z954 Presence of other heart-valve replacement: Secondary | ICD-10-CM

## 2022-03-19 DIAGNOSIS — I059 Rheumatic mitral valve disease, unspecified: Secondary | ICD-10-CM

## 2022-03-19 DIAGNOSIS — Z95818 Presence of other cardiac implants and grafts: Secondary | ICD-10-CM | POA: Diagnosis not present

## 2022-03-19 DIAGNOSIS — J449 Chronic obstructive pulmonary disease, unspecified: Secondary | ICD-10-CM

## 2022-03-19 LAB — ECHOCARDIOGRAM COMPLETE
Area-P 1/2: 2.37 cm2
MV M vel: 4.53 m/s
MV Peak grad: 82.1 mmHg
S' Lateral: 2.8 cm

## 2022-03-19 NOTE — Patient Instructions (Signed)
Medication Instructions:  ?The current medical regimen is effective;  continue present plan and medications. ? ?*If you need a refill on your cardiac medications before your next appointment, please call your pharmacy* ? ?Testing/Procedures: ?ZIO XT- Long Term Monitor Instructions ? ?Your physician has requested you wear a ZIO patch monitor for 14 days.  ?This is a single patch monitor. [employer] supplies one patch monitor per enrollment. Additional ?stickers are not available. Please do not apply patch if you will be having a Nuclear Stress Test,  ?Echocardiogram, Cardiac CT, MRI, or Chest Xray during the period you would be wearing the  ?monitor. The patch cannot be worn during these tests. You cannot remove and re-apply the  ?ZIO XT patch monitor.  ?Your ZIO patch monitor will be mailed 3 day USPS to your address on file. It may take 3-5 days  ?to receive your monitor after you have been enrolled.  ?Once you have received your monitor, please review the enclosed instructions. Your monitor  ?has already been registered assigning a specific monitor serial # to you. ? ?Billing and Patient Assistance Program Information ? ?We have supplied [employer] with any of your insurance information on file for billing purposes. ?[employer] offers a sliding scale Patient Assistance Program for patients that do not have  ?insurance, or whose insurance does not completely cover the cost of the ZIO monitor.  ?You must apply for the Patient Assistance Program to qualify for this discounted rate.  ?To apply, please call [employer] at [phone], select option 4, select option 2, ask to apply for  ?Patient Assistance Program. Crystal Bass will ask your household income, and how many people  ?are in your household. They will quote your out-of-pocket cost based on that information.  ?[employer] will also be able to set up a 17-month interest-free payment plan if needed. ? ?Applying the monitor ?  ?Shave hair from upper left chest.  ?Hold abrader disc  by orange tab. Rub abrader in 40 strokes over the upper left chest as  ?indicated in your monitor instructions.  ?Clean area with 4 enclosed alcohol pads. Let dry.  ?Apply patch as indicated in monitor instructions. Patch will be placed under collarbone on left  ?side of chest with arrow pointing upward.  ?Rub patch adhesive wings for 2 minutes. Remove white label marked "1". Remove the white  ?label marked "2". Rub patch adhesive wings for 2 additional minutes.  ?While looking in a mirror, press and release button in center of patch. A small green light will  ?flash 3-4 times. This will be your only indicator that the monitor has been turned on.  ?Do not shower for the first 24 hours. You may shower after the first 24 hours.  ?Press the button if you feel a symptom. You will hear a small click. Record Date, Time and  ?Symptom in the Patient Logbook.  ?When you are ready to remove the patch, follow instructions on the last 2 pages of Patient  ?Logbook. Stick patch monitor onto the last page of Patient Logbook.  ?Place Patient Logbook in the blue and white box. Use locking tab on box and tape box closed  ?securely. The blue and white box has prepaid postage on it. Please place it in the mailbox as  ?soon as possible. Your physician should have your test results approximately 7 days after the  ?monitor [facility]  ?Call [facility] [phone] you have questions regarding  ?your ZIO XT patch  monitor. Call them immediately if you see an orange light blinking on your  ?monitor.  ?If your monitor falls off in less than 4 days, contact our Monitor department at [phone].  ?If your monitor becomes loose or falls off after 4 days call [employer] at [phone] for  ?suggestions on securing your monitor ? ? ? ?Follow-Up: ?At [facility], you and your health needs are our priority.  As part of our continuing mission to provide you with exceptional heart care, we  have created designated Provider Care Teams.  These Care Teams include your primary Cardiologist (physician) and Advanced Practice Providers (APPs -  Physician Assistants and Nurse Practitioners) who all work together to provide you with the care you need, when you need it. ? ?We recommend signing up for the patient portal called "MyChart".  Sign up information is provided on this After Visit Summary.  MyChart is used to connect with patients for Virtual Visits (Telemedicine).  Patients are able to view lab/test results, encounter notes, upcoming appointments, etc.  Non-urgent messages can be sent to your provider as well.   ?To learn more about what you can do with MyChart, go to [email or web address].   ? ?Your next appointment:   ?4-6  week(s) ? ?The format for your next appointment:   ?In Person ? ?Provider:   ?Kathyrn Drown, NP  ? ? ?Important Information About Sugar ? ? ? ? ?  ?

## 2022-03-19 NOTE — Progress Notes (Unsigned)
Enrolled for [employer] to mail a ZIO XT long term holter monitor to the patients address on file.  ? ?Dr. Rod Can to read. ?

## 2022-03-20 NOTE — Telephone Encounter (Signed)
Spoke with patient to follow up on how she is doing. Patient states she is not doing as well as they hoped after her procedure. She may be starting cardiac rehab but not sure. She wanted to make sure it is ok to proceed with Prolia injections or if there was any other recommendation. Patient saw cardiologist on 03/19/22, Dr Darnell Level please review and let me know how to proceed. ? ?Patient had labs done on 02/16/22-ok to use those labs or need to re draw. ?Based on those results CrCl is 39.29 mL/min  ?

## 2022-03-21 ENCOUNTER — Encounter: Payer: Medicare PPO | Admitting: *Deleted

## 2022-03-21 VITALS — Ht 63.1 in | Wt 122.1 lb

## 2022-03-21 DIAGNOSIS — Z9889 Other specified postprocedural states: Secondary | ICD-10-CM | POA: Diagnosis not present

## 2022-03-21 DIAGNOSIS — Z95818 Presence of other cardiac implants and grafts: Secondary | ICD-10-CM | POA: Diagnosis not present

## 2022-03-21 NOTE — Progress Notes (Signed)
Cardiac Individual Treatment Plan ? ?Patient Details  ?Name: Crystal Bass ?MRN: [id number] ?Date of Birth: Jul 23, 1941 ?Referring Provider:   ?Flowsheet Row Cardiac Rehab from 03/21/2022 in [facility] Cardiac and Pulmonary Rehab  ?Referring Provider [name] MD  ? ?  ? ? ?Initial Encounter Date:  ?Flowsheet Row Cardiac Rehab from 03/21/2022 in [facility] Cardiac and Pulmonary Rehab  ?Date 03/21/22  ? ?  ? ? ?Visit Diagnosis: S/P mitral valve repair ? ?Patient's Home Medications on Admission: ? ?Current Outpatient Medications:  ?  acetaminophen (TYLENOL) 500 MG tablet, Take 2 tablets (1,000 mg total) by mouth 2 (two) times daily as needed., Disp: , Rfl:  ?  albuterol (VENTOLIN HFA) 108 (90 Base) MCG/ACT inhaler, Inhale 2 puffs into the lungs every 6 (six) hours as needed for wheezing or shortness of breath., Disp: 18 g, Rfl: 1 ?  amLODipine (NORVASC) 2.5 MG tablet, TAKE 1 TABLET BY MOUTH ONCE A DAY, Disp: 30 tablet, Rfl: 5 ?  amoxicillin (AMOXIL) 500 MG capsule, Take 4 capsules (2,000 mg total) by mouth as directed. Take 4 tablets 1 hour prior to dental work, including cleanings., Disp: 12 capsule, Rfl: 12 ?  aspirin 81 MG tablet, Take 1 tablet (81 mg total) by mouth daily., Disp: 30 tablet, Rfl:  ?  benzocaine-menthol (CHLORASEPTIC) 6-10 MG lozenge, Take 1 lozenge by mouth as needed for sore throat., Disp: 100 tablet, Rfl: 0 ?  Carbidopa-Levodopa ER (SINEMET CR) 25-100 MG tablet controlled release, TAKE 2 TABLET BY MOUTH AT 9 AM, 1 TAB AT1 PM AND 1 TAB AT 5 PM, Disp: 360 tablet, Rfl: 1 ?  clonazePAM (KLONOPIN) 0.5 MG tablet, TAKE 1/2 A TABLET BY MOUTH AT BEDTIME, Disp: 45 tablet, Rfl: 1 ?  clopidogrel (PLAVIX) 75 MG tablet, Take 1 tablet (75 mg total) by mouth daily with breakfast., Disp: 90 tablet, Rfl: 0 ?  EPINEPHrine 0.3 mg/0.3 mL IJ SOAJ injection, Inject 0.3 mg into the muscle as needed for anaphylaxis., Disp: , Rfl:  ?  fexofenadine (ALLEGRA) 180 MG tablet, Take 180 mg by mouth daily., Disp: , Rfl:  ?   Fluticasone-Umeclidin-Vilant (TRELEGY ELLIPTA) 100-62.5-25 MCG/ACT AEPB, Inhale 1 puff into the lungs daily., Disp: 60 each, Rfl: 5 ?  gabapentin ([name]) 300 MG capsule, TAKE 2 CAPSULES BY MOUTH 3 TIMES DAILY, Disp: 540 capsule, Rfl: 1 ?  pantoprazole (PROTONIX) 40 MG tablet, Take 1 tablet (40 mg total) by mouth daily., Disp: 90 tablet, Rfl: 2 ? ?Past Medical History: ?Past Medical History:  ?Diagnosis Date  ? Aneurysm of aorta (HCC)   ? 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  ? Anginal pain ([place])   ? Aortic root aneurysm ([place])   ? Arthritis   ? Asthma   ? Breast cancer ([place]) 2002  ? infiltrative ductal carcinoma   ? Cataract 2019  ? COPD (chronic obstructive pulmonary disease) ([place]) 07/2007  ? Ductal carcinoma of breast, estrogen receptor positive, stage 1 (HCC) 09/16/2012  ? Dyspnea   ? Endometriosis   ? GERD (gastroesophageal reflux disease)   ? Hypercalcemia 09/14/2013  ? Hypertension   ? Lesion of breast 1992  ? right, benign  ? Lung disease   ? secondary to [name] infection  ? Mastalgia 05/1994  ? Osteoporosis, post-menopausal 03/14/2012  ? Ovarian tumor of borderline malignancy, right 2004  ? Pancreatitis   ? secondary to Cholelithiasis  ? Parkinson disease ([place]) 02/10/2015  ? Post-thoracotomy pain syndrome   ? Pseudomonas pneumonia ([place]) 03/2008  ? Status post implantation  of mitral valve leaflet clip 02/15/2022  ? s/p XTW MitraClip x2 by Dr. Burt Knack in the A2/P2 position  ? Traumatic closed fracture of distal clavicle with minimal displacement, left, initial encounter 06/2021  ? [facility]  ? Uterine fibroid   ? Vitamin D deficiency   ? ? ?Tobacco Use: ?Social History  ? ?Tobacco Use  ?Smoking Status Never  ?Smokeless Tobacco Never  ? ? ?Labs: ?Review Flowsheet   ? ?  ?  Latest Ref Rng & Units 01/14/2018 01/19/2019 01/22/2020 08/08/2021  ?Labs for ITP Cardiac and Pulmonary Rehab  ?Cholestrol 0 - 200 mg/dL 172   181   176   171    ?LDL (calc) 0 - 99 mg/dL 95   101   102   97    ?HDL-C >39.00 mg/dL 44.90   57.40    48.50   53.30    ?Trlycerides 0.0 - 149.0 mg/dL 161.0   115.0   126.0   100.0    ?Hemoglobin A1c 4.6 - 6.5 % 5.3       ?PH, Arterial 7.35 - 7.45      ?PCO2 arterial 32 - 48 mmHg      ?Bicarbonate 20.0 - 28.0 mmol/L      ?O2 Saturation %      ? ?  02/12/2022  ?Labs for ITP Cardiac and Pulmonary Rehab  ?Cholestrol   ?LDL (calc)   ?HDL-C   ?Trlycerides   ?Hemoglobin A1c   ?PH, Arterial 7.43    ?PCO2 arterial 40    ?Bicarbonate 26.5    ?O2 Saturation 98.7    ?  ? ? Multiple values from one day are sorted in reverse-chronological order  ?  ?  ? ? ? ?Exercise Target Goals: ?Exercise Program Goal: ?Individual exercise prescription set using results from initial 6 min walk test and THRR while considering  patient?s activity barriers and safety.  ? ?Exercise Prescription Goal: ?Initial exercise prescription builds to 30-45 minutes a day of aerobic activity, 2-3 days per week.  Home exercise guidelines will be given to patient during program as part of exercise prescription that the participant will acknowledge. ? ? ?Education: Aerobic Exercise: ?- Group verbal and visual presentation on the components of exercise prescription. Introduces F.I.T.T principle from ACSM for exercise prescriptions.  Reviews F.I.T.T. principles of aerobic exercise including progression. Written material given at graduation. ?Flowsheet Row Cardiac Rehab from 03/21/2022 in [facility] Cardiac and Pulmonary Rehab  ?Education need identified 03/21/22  ? ?  ? ? ?Education: Resistance Exercise: ?- Group verbal and visual presentation on the components of exercise prescription. Introduces F.I.T.T principle from ACSM for exercise prescriptions  Reviews F.I.T.T. principles of resistance exercise including progression. Written material given at graduation. ? ?  ?Education: Exercise & Equipment Safety: ?- Individual verbal instruction and demonstration of equipment use and safety with use of the equipment. ?Flowsheet Row Cardiac Rehab from 03/21/2022 in [facility] Cardiac  and Pulmonary Rehab  ?Date 03/21/22  ?Educator [facility]  ?Instruction Review Code 1- Verbalizes Understanding  ? ?  ? ? ?Education: Exercise Physiology & General Exercise Guidelines: ?- Group verbal and written instruction with models to review the exercise physiology of the cardiovascular system and associated critical values. Provides general exercise guidelines with specific guidelines to those with heart or lung disease.  ? ? ?Education: Flexibility, Balance, Mind/Body Relaxation: ?- Group verbal and visual presentation with interactive activity on the components of exercise prescription. Introduces F.I.T.T principle from ACSM for exercise prescriptions. Reviews F.I.T.T. principles of flexibility  and balance exercise training including progression. Also discusses the mind body connection.  Reviews various relaxation techniques to help reduce and manage stress (i.e. Deep breathing, progressive muscle relaxation, and visualization). Balance handout provided to take home. Written material given at graduation. ? ? ?Activity Barriers & Risk Stratification: ? Activity Barriers & Cardiac Risk Stratification - 03/21/22 1506   ? ?  ? Activity Barriers & Cardiac Risk Stratification  ? Activity Barriers Shortness of Breath;Neck/Spine Problems;Joint Problems;Other (comment);Deconditioning;Muscular Weakness;Balance Concerns;History of Falls;Arthritis;Back Problems   ? Comments Parkinsons   ? Cardiac Risk Stratification Moderate   ? ?  ?  ? ?  ? ? ?6 Minute Walk: ? 6 Minute Walk   ? ? [place] Name 03/21/22 1504  ?  ?  ?  ? 6 Minute Walk  ? Phase Initial    ? Distance 800 feet    ? Walk Time 6 minutes    ? # of Rest Breaks 0    ? MPH 1.51    ? METS 1.61    ? RPE 16    ? Perceived Dyspnea  3    ? VO2 Peak 5.64    ? Symptoms Yes (comment)    ? Comments SOB, fatigue    ? Resting HR 67 bpm    ? Resting BP 110/64    ? Resting Oxygen Saturation  97 %    ? Exercise Oxygen Saturation  during 6 min walk 91 %    ? Max Ex. HR 86 bpm    ? Max Ex.  BP 128/70    ? 2 Minute Post BP 114/62    ?  ? Interval HR  ? 1 Minute HR 79    ? 2 Minute HR 81    ? 3 Minute HR 84    ? 4 Minute HR 84    ? 5 Minute HR 85    ? 6 Minute HR 86    ? Interval Heart Rate? Yes  [place]

## 2022-03-21 NOTE — Patient Instructions (Signed)
Patient Instructions ? ?Patient Details  ?Name: Crystal Bass ?MRN: [id number] ?Date of Birth: 1941-08-05 ?Referring Provider:  Wellington Hampshire, MD ? ?Below are your personal goals for exercise, nutrition, and risk factors. Our goal is to help you stay on track towards obtaining and maintaining these goals. We will be discussing your progress on these goals with you throughout the program. ? ?Initial Exercise Prescription: ? Initial Exercise Prescription - 03/21/22 1500   ? ?  ? Date of Initial Exercise RX and Referring Provider  ? Date 03/21/22   ? Referring Provider [name] MD   ?  ? Oxygen  ? Maintain Oxygen Saturation 88% or higher   ?  ? Recumbant Bike  ? Level 1   ? RPM 50   ? Watts 10   ? Minutes 15   ? METs 1.5   ?  ? T5 Nustep  ? Level 1   ? SPM 80   ? Minutes 15   ? METs 1.5   ?  ? Biostep-RELP  ? Level 1   ? SPM 50   ? Minutes 15   ? METs 2   ?  ? Track  ? Laps 21   ? Minutes 15   ? METs 2.14   ?  ? Prescription Details  ? Frequency (times per week) 3   ? Duration Progress to 30 minutes of continuous aerobic without signs/symptoms of physical distress   ?  ? Intensity  ? THRR 40-80% of Max Heartrate 97-126   ? Ratings of Perceived Exertion 11-13   ? Perceived Dyspnea 0-4   ?  ? Progression  ? Progression Continue to progress workloads to maintain intensity without signs/symptoms of physical distress.   ?  ? Resistance Training  ? Training Prescription Yes   ? Weight 2 lb   ? Reps 10-15   ? ?  ?  ? ?  ? ? ?Exercise Goals: ?Frequency: Be able to perform aerobic exercise two to three times per week in program working toward 2-5 days per week of home exercise. ? ?Intensity: Work with a perceived exertion of 11 (fairly light) - 15 (hard) while following your exercise prescription.  We will make changes to your prescription with you as you progress through the program. ?  ?Duration: Be able to do 30 to 45 minutes of continuous aerobic exercise in addition to a 5 minute warm-up and a 5 minute  cool-down routine. ?  ?Nutrition Goals: ?Your personal nutrition goals will be established when you do your nutrition analysis with the dietician. ? ?The following are general nutrition guidelines to follow: ?Cholesterol < '200mg'$ /day ?Sodium < '1500mg'$ /day ?Fiber: Men over 50 yrs - 30 grams per day ? ?Personal Goals: ? Personal Goals and Risk Factors at Admission - 03/21/22 1509   ? ?  ? Core Components/Risk Factors/Patient Goals on Admission  ?  Weight Management Yes;Weight Maintenance   ? Intervention Weight Management: Develop a combined nutrition and exercise program designed to reach desired caloric intake, while maintaining appropriate intake of nutrient and fiber, sodium and fats, and appropriate energy expenditure required for the weight goal.;Weight Management: Provide education and appropriate resources to help participant work on and attain dietary goals.   ? Admit Weight 122 lb 1.6 oz (55.4 kg)   ? Goal Weight: Short Term 123 lb (55.8 kg)   ? Goal Weight: Long Term 123 lb (55.8 kg)   ? Expected Outcomes Short Term: Continue to assess and modify interventions until  short term weight is achieved;Long Term: Adherence to nutrition and physical activity/exercise program aimed toward attainment of established weight goal;Weight Maintenance: Understanding of the daily nutrition guidelines, which includes 25-35% calories from fat, 7% or less cal from saturated fats, less than '200mg'$  cholesterol, less than 1.5gm of sodium, & 5 or more servings of fruits and vegetables daily   ? Improve shortness of breath with ADL's Yes   ? Intervention Provide education, individualized exercise plan and daily activity instruction to help decrease symptoms of SOB with activities of daily living.   ? Expected Outcomes Short Term: Improve cardiorespiratory fitness to achieve a reduction of symptoms when performing ADLs;Long Term: Be able to perform more ADLs without symptoms or delay the onset of symptoms   ? Hypertension Yes   ?  Intervention Provide education on lifestyle modifcations including regular physical activity/exercise, weight management, moderate sodium restriction and increased consumption of fresh fruit, vegetables, and low fat dairy, alcohol moderation, and smoking cessation.;Monitor prescription use compliance.   ? Expected Outcomes Short Term: Continued assessment and intervention until BP is < 140/26m HG in hypertensive participants. < 130/871mHG in hypertensive participants with diabetes, heart failure or chronic kidney disease.;Long Term: Maintenance of blood pressure at goal levels.   ? ?  ?  ? ?  ? ? ?Tobacco Use Initial Evaluation: ?Social History  ? ?Tobacco Use  ?Smoking Status Never  ?Smokeless Tobacco Never  ? ? ?Exercise Goals and Review: ? Exercise Goals   ? ? [place] 03/21/22 1508  ?  ?  ?  ?  ?  ? Exercise Goals  ? Increase Physical Activity Yes      ? Intervention Provide advice, education, support and counseling about physical activity/exercise needs.;Develop an individualized exercise prescription for aerobic and resistive training based on initial evaluation findings, risk stratification, comorbidities and participant's personal goals.      ? Expected Outcomes Short Term: Attend rehab on a regular basis to increase amount of physical activity.;Long Term: Add in home exercise to make exercise part of routine and to increase amount of physical activity.;Long Term: Exercising regularly at least 3-5 days a week.      ? Increase Strength and Stamina Yes      ? Intervention Provide advice, education, support and counseling about physical activity/exercise needs.;Develop an individualized exercise prescription for aerobic and resistive training based on initial evaluation findings, risk stratification, comorbidities and participant's personal goals.      ? Expected Outcomes Short Term: Increase workloads from initial exercise prescription for resistance, speed, and METs.;Short Term: Perform resistance training  exercises routinely during rehab and add in resistance training at home;Long Term: Improve cardiorespiratory fitness, muscular endurance and strength as measured by increased METs and functional capacity (6MWT)      ? Able to understand and use rate of perceived exertion (RPE) scale Yes      ? Intervention Provide education and explanation on how to use RPE scale      ? Expected Outcomes Short Term: Able to use RPE daily in rehab to express subjective intensity level;Long Term:  Able to use RPE to guide intensity level when exercising independently      ? Able to understand and use Dyspnea scale Yes      ? Intervention Provide education and explanation on how to use Dyspnea scale      ? Expected Outcomes Short Term: Able to use Dyspnea scale daily in rehab to express subjective sense of shortness of breath during exertion;Long Term:  Able to use Dyspnea scale to guide intensity level when exercising independently      ? Knowledge and understanding of Target Heart Rate Range (THRR) Yes      ? Intervention Provide education and explanation of THRR including how the numbers were predicted and where they are located for reference      ? Expected Outcomes Short Term: Able to state/look up THRR;Short Term: Able to use daily as guideline for intensity in rehab;Long Term: Able to use THRR to govern intensity when exercising independently      ? Able to check pulse independently Yes      ? Intervention Provide education and demonstration on how to check pulse in carotid and radial arteries.;Review the importance of being able to check your own pulse for safety during independent exercise      ? Expected Outcomes Short Term: Able to explain why pulse checking is important during independent exercise;Long Term: Able to check pulse independently and accurately      ? Understanding of Exercise Prescription Yes      ? Intervention Provide education, explanation, and written materials on patient's individual exercise prescription       ? Expected Outcomes Short Term: Able to explain program exercise prescription;Long Term: Able to explain home exercise prescription to exercise independently      ? ?  ?  ? ?  ? ? ?Copy of goals given to part

## 2022-03-21 NOTE — Telephone Encounter (Signed)
Spoke with patient. Lab appt made for 03/22/22 and NV 03/29/22 ?

## 2022-03-21 NOTE — Addendum Note (Signed)
Addended by: [name] on: 03/21/2022 07:26 AM ? ? Modules accepted: Orders ? ?

## 2022-03-21 NOTE — Telephone Encounter (Addendum)
I'm sorry to hear she's not recovering as well as was hoped.  ?Last prolia injection was 07/25/2021.  ?Do recommend proceeding with next prolia injection if she feels up to it. Recommend recheck labs prior given ongoing dyspnea. Labs ordered.  ?

## 2022-03-22 ENCOUNTER — Other Ambulatory Visit: Payer: Medicare PPO

## 2022-03-22 DIAGNOSIS — R002 Palpitations: Secondary | ICD-10-CM | POA: Diagnosis not present

## 2022-03-23 ENCOUNTER — Other Ambulatory Visit (INDEPENDENT_AMBULATORY_CARE_PROVIDER_SITE_OTHER): Payer: Medicare PPO

## 2022-03-23 DIAGNOSIS — R0609 Other forms of dyspnea: Secondary | ICD-10-CM | POA: Diagnosis not present

## 2022-03-23 DIAGNOSIS — M810 Age-related osteoporosis without current pathological fracture: Secondary | ICD-10-CM

## 2022-03-23 LAB — BASIC METABOLIC PANEL
BUN: 19 mg/dL (ref 6–23)
CO2: 29 mEq/L (ref 19–32)
Calcium: 9.2 mg/dL (ref 8.4–10.5)
Chloride: 104 mEq/L (ref 96–112)
Creatinine, Ser: 0.85 mg/dL (ref 0.40–1.20)
GFR: 65.34 mL/min (ref >60.00)
Glucose, Bld: 125 mg/dL — ABNORMAL HIGH (ref 70–99)
Potassium: 3.5 mEq/L (ref 3.5–5.1)
Sodium: 140 mEq/L (ref 135–145)

## 2022-03-23 LAB — CBC WITH DIFFERENTIAL/PLATELET
Basophils Absolute: 0 10*3/uL (ref 0.0–0.1)
Basophils Relative: 1 % (ref 0.0–3.0)
Eosinophils Absolute: 0.2 10*3/uL (ref 0.0–0.7)
Eosinophils Relative: 3.6 % (ref 0.0–5.0)
HCT: 35.6 % — ABNORMAL LOW (ref 36.0–46.0)
Hemoglobin: 11.6 g/dL — ABNORMAL LOW (ref 12.0–15.0)
Lymphocytes Relative: 19.6 % (ref 12.0–46.0)
Lymphs Abs: 0.9 10*3/uL (ref 0.7–4.0)
MCHC: 32.6 g/dL (ref 30.0–36.0)
MCV: 93.9 fl (ref 78.0–100.0)
Monocytes Absolute: 0.4 10*3/uL (ref 0.1–1.0)
Monocytes Relative: 10.1 % (ref 3.0–12.0)
Neutro Abs: 2.9 10*3/uL (ref 1.4–7.7)
Neutrophils Relative %: 65.7 % (ref 43.0–77.0)
Platelets: 131 10*3/uL — ABNORMAL LOW (ref 150.0–400.0)
RBC: 3.79 Mil/uL — ABNORMAL LOW (ref 3.87–5.11)
RDW: 14.6 % (ref 11.5–15.5)
WBC: 4.4 10*3/uL (ref 4.0–10.5)

## 2022-03-23 LAB — TSH: TSH: 0.84 u[IU]/mL (ref 0.35–5.50)

## 2022-03-26 ENCOUNTER — Telehealth: Payer: Self-pay | Admitting: Cardiology

## 2022-03-26 ENCOUNTER — Encounter: Payer: Medicare PPO | Attending: Cardiovascular Disease

## 2022-03-26 DIAGNOSIS — Z954 Presence of other heart-valve replacement: Secondary | ICD-10-CM | POA: Insufficient documentation

## 2022-03-26 DIAGNOSIS — Z48812 Encounter for surgical aftercare following surgery on the circulatory system: Secondary | ICD-10-CM | POA: Insufficient documentation

## 2022-03-26 DIAGNOSIS — Z9889 Other specified postprocedural states: Secondary | ICD-10-CM

## 2022-03-26 NOTE — Progress Notes (Signed)
Daily Session Note ? ?Patient Details  ?Name: Crystal Bass ?MRN: [id number] ?Date of Birth: [birth year] ?Referring Provider:   ?Flowsheet Row Cardiac Rehab from 03/21/2022 in [facility] Cardiac and Pulmonary Rehab  ?Referring Provider [name] MD  ? ?  ? ? ?Encounter Date: 03/26/2022 ? ?Check In: ? Session Check In - 03/26/22 1330   ? ?  ? Check-In  ? Supervising physician immediately available to respond to emergencies See telemetry face sheet for immediately available ER MD   ? Location [facility]-Cardiac & Pulmonary Rehab   ? Staff Present Birdie Sons, MPA, RN;Joseph [place], RCP,RRT,BSRT;Kara [place], MS, ASCM CEP, Exercise Physiologist   ? Virtual Visit No   ? Medication changes reported     No   ? Fall or balance concerns reported    No   ? Warm-up and Cool-down Performed on first and last piece of equipment   ? Resistance Training Performed Yes   ? VAD Patient? No   ? PAD/SET Patient? No   ?  ? Pain Assessment  ? Currently in Pain? No/denies   ? ?  ?  ? ?  ? ? ? ? ? ?Social History  ? ?Tobacco Use  ?Smoking Status Never  ?Smokeless Tobacco Never  ? ? ?Goals Met:  ?Independence with exercise equipment ?Exercise tolerated well ?No report of concerns or symptoms today ?Strength training completed today ? ?Goals Unmet:  ?Not Applicable ? ?Comments: First full day of exercise!  Patient was oriented to gym and equipment including functions, settings, policies, and procedures.  Patient's individual exercise prescription and treatment plan were reviewed.  All starting workloads were established based on the results of the 6 minute walk test done at initial orientation visit.  The plan for exercise progression was also introduced and progression will be customized based on patient's performance and goals. ? ? ? ? ?Dr. Emily Filbert is Medical Director for [place].  ?Dr. Ottie Glazier is Medical Director for [facility] Pulmonary Rehabilitation. ?

## 2022-03-26 NOTE — Telephone Encounter (Signed)
Labs done 03/23/22 ?Calcium normal at 9.2 ?CrCl is 46.94 mL/min ?

## 2022-03-26 NOTE — Telephone Encounter (Signed)
?  Pt is calling, she said she has some questions about her heart and requesting to speak with a nurse ?

## 2022-03-26 NOTE — Telephone Encounter (Signed)
I spoke with the pt and she was asking if it was safe for her to have a bone density scan and I advised her it was fine for her to have it... and she wanted to be sure she could do cardiac rehab while she was wearing a Zio patch and I explained to her that she could still go to rehab and they will place their own monitor on her while she is exercising but to let them know that she will have it for 2 weeks so we can evaluate her palpations.  ?

## 2022-03-29 ENCOUNTER — Ambulatory Visit (INDEPENDENT_AMBULATORY_CARE_PROVIDER_SITE_OTHER): Payer: Medicare PPO

## 2022-03-29 DIAGNOSIS — M810 Age-related osteoporosis without current pathological fracture: Secondary | ICD-10-CM | POA: Diagnosis not present

## 2022-03-29 MED ORDER — DENOSUMAB 60 MG/ML ~~LOC~~ SOSY
60.0000 mg | PREFILLED_SYRINGE | Freq: Once | SUBCUTANEOUS | Status: AC
  Administered 2022-03-29: 60 mg via SUBCUTANEOUS

## 2022-03-29 NOTE — Progress Notes (Signed)
Per orders of Dr. Glori Bickers, in Dr. [name]' absence, an injection of Prolia given by Loreen Freud. ?Patient tolerated injection well.  ?

## 2022-03-30 ENCOUNTER — Ambulatory Visit (INDEPENDENT_AMBULATORY_CARE_PROVIDER_SITE_OTHER): Payer: Medicare PPO

## 2022-03-30 ENCOUNTER — Ambulatory Visit: Payer: Medicare PPO | Admitting: Nurse Practitioner

## 2022-03-30 ENCOUNTER — Encounter: Payer: Self-pay | Admitting: Nurse Practitioner

## 2022-03-30 VITALS — BP 136/82 | HR 67 | Ht 62.0 in | Wt 122.8 lb

## 2022-03-30 DIAGNOSIS — J449 Chronic obstructive pulmonary disease, unspecified: Secondary | ICD-10-CM

## 2022-03-30 DIAGNOSIS — R0600 Dyspnea, unspecified: Secondary | ICD-10-CM | POA: Diagnosis not present

## 2022-03-30 DIAGNOSIS — J189 Pneumonia, unspecified organism: Secondary | ICD-10-CM

## 2022-03-30 DIAGNOSIS — I059 Rheumatic mitral valve disease, unspecified: Secondary | ICD-10-CM | POA: Diagnosis not present

## 2022-03-30 DIAGNOSIS — J479 Bronchiectasis, uncomplicated: Secondary | ICD-10-CM | POA: Diagnosis not present

## 2022-03-30 MED ORDER — BREZTRI AEROSPHERE 160-9-4.8 MCG/ACT IN AERO: 2.0000 | INHALATION_SPRAY | Freq: Two times a day (BID) | RESPIRATORY_TRACT | 0 refills | Status: DC

## 2022-03-30 NOTE — Progress Notes (Signed)
? ?'@Patient'$  ID: Crystal Bass, female    DOB: 08/27/1941, 81 y.o.   MRN: [id number] ? ?Chief Complaint  ?Patient presents with  ? Follow-up  ?  SOB  ? ? ?Referring provider: ?[name], MD ? ?HPI: ?81 year old female, never smoker followed for COPD with asthma, history of [name] infection.  She is patient Dr. Golden Pop last seen in office 02/09/2022.  Past medical history significant for allergic rhinitis, hypertension, aortic aneurysm, PE, breast cancer, dysphagia, Parkinson's disease, severe mitral valve regurgitation status post clip. ? ?TEST/EVENTS:  ?01/13/2019 PFTs: FVC 75, FEV1 77, ratio 77, TLC 102, DLCOcor 95. Borderline BD with significant midflow reversibility.  ?02/03/2022 CT chest with contrast: Enlarged heart size.  No central or proximal segmental pulmonary embolus noted.  No LAD present.  There is a tiny hiatal hernia.  There are biapical mild centrilobular emphysematous changes.  Grossly similar appearing right upper lobe.  Mediastinum and apical postoperative changes with architectural distortion and volume loss.  Similar appearing lingular atelectasis.  No new focal consolidation.  There is a slightly more conspicuous right lower and middle lobe tree-in-bud nodularity.  Interval development of left lower lobe tree-in-bud nodularities.  Findings suggestive of atypical infection.  Stable 1.1 x 0.6 cm right lower lobe centrally calcified pulmonary nodule. ?03/19/2022 echocardiogram: 1 month after mitral valve clip.  EF 65 to 70%.  G2 DD.  LA severely dilated. Normal PASP.  PFO is present as seen by color Doppler with left-to-right flow.  MR is very eccentric; regurgitation moderate in severity; appears more prominent than previous echo after procedure. ? ?02/09/2022: OV with Volanda Napoleon NP for surgical risk assessment.  She was seen in the ED on 02/03/2022 for chest pain and diagnosed with atypical pneumonia.  Had a CXR that showed right upper lobe airspace opacity and possible small apical thorax.  CT  chest was negative for pneumothorax but did show diffuse tree-in-bud opacities concerning for pneumonia.  Will be finishing Augmentin tomorrow.  Doing well on Trelegy.  No signs of COPD exacerbation.  Advised that she would be high risk for surgery given age, COPD/asthma, [name] and recent pneumonia.  Plans for repeat CXR today which did not show any new opacities; persistent right upper lobe opacity in keeping with known history. ? ?03/30/2022: Today-follow-up ?Patient presents today for follow-up.  She is somewhat unsure why she is here.  She was recommended by her cardiologist to come back as she has had progressive shortness of breath over the last 6 months.  She was found to have severe mitral valve regurgitation and underwent mitral valve clip in March.  Initially felt better and then began to have shortness of breath symptoms again.  Went back to cardiology and had a echocardiogram which showed moderate mitral valve regurgitation, worsened since echo after procedure, PFO and G2 DD.  She also reports that she has had some increased swelling in both of her lower legs.  She anticipated to see her primary cardiologist on Tuesday.  She denies any wheezing, cough, hemoptysis, recent weight loss, anorexia, night sweats, recent fevers.  Denies any allergy type symptoms.  She continues on Trelegy daily.  Never requires rescue inhaler. ? ?Allergies  ?Allergen Reactions  ? Sulfa Antibiotics Anaphylaxis  ? Sulfonamide Derivatives Anaphylaxis  ? Topamax [Topiramate] Other (See Comments)  ?  Metabolic acidosis   ? Biaxin [Clarithromycin] Other (See Comments)  ?  pericarditis  ? Hydrocodone Other (See Comments)  ?  "hyper and climbing the walls"  ? Motrin [Ibuprofen] Other (See  Comments)  ?  headaches  ? Symbicort [Budesonide-Formoterol Fumarate] Other (See Comments)  ?  02/07/15 tremor  ? Percocet [Oxycodone-Acetaminophen] Itching and Rash  ? Tape Itching and Rash  ?  Use paper tape only  ? Tetracyclines & Related Other (See  Comments)  ?  "immediate yeast infection"  ? ? ?Immunization History  ?Administered Date(s) Administered  ? Fluad Quad(high Dose 65+) 08/11/2019, 08/03/2020, 08/08/2021  ? Influenza Split 09/11/2011  ? Influenza Whole 09/09/2007, 08/08/2008, 08/15/2010, 08/26/2012  ? Influenza,inj,Quad PF,6+ Mos 08/18/2013, 08/20/2014, 08/25/2018  ? Influenza-Unspecified 09/27/2015, 08/26/2017  ? Moderna Sars-Covid-2 Vaccination 01/01/2020, 01/20/2020, 02/16/2020, 07/25/2020, 11/22/2020  ? Pneumococcal Conjugate-13 02/12/2014  ? Pneumococcal Polysaccharide-23 11/26/2006, 10/21/2012, 11/22/2020  ? Td 01/01/2011, 06/30/2021  ? Zoster Recombinat (Shingrix) 02/22/2018, 07/08/2019  ? Zoster, Live 05/26/2007  ? ? ?Past Medical History:  ?Diagnosis Date  ? Aneurysm of aorta (HCC)   ? 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  ? Anginal pain ([place])   ? Aortic root aneurysm ([place])   ? Arthritis   ? Asthma   ? Breast cancer ([place]) 2002  ? infiltrative ductal carcinoma   ? Cataract 2019  ? COPD (chronic obstructive pulmonary disease) ([place]) 07/2007  ? Ductal carcinoma of breast, estrogen receptor positive, stage 1 (HCC) 09/16/2012  ? Dyspnea   ? Endometriosis   ? GERD (gastroesophageal reflux disease)   ? Hypercalcemia 09/14/2013  ? Hypertension   ? Lesion of breast 1992  ? right, benign  ? Lung disease   ? secondary to [name] infection  ? Mastalgia 05/1994  ? Osteoporosis, post-menopausal 03/14/2012  ? Ovarian tumor of borderline malignancy, right 2004  ? Pancreatitis   ? secondary to Cholelithiasis  ? Parkinson disease ([place]) 02/10/2015  ? Post-thoracotomy pain syndrome   ? Pseudomonas pneumonia ([place]) 03/2008  ? Status post implantation of mitral valve leaflet clip 02/15/2022  ? s/p XTW MitraClip x2 by Dr. Burt Knack in the A2/P2 position  ? Traumatic closed fracture of distal clavicle with minimal displacement, left, initial encounter 06/2021  ? [facility]  ? Uterine fibroid   ? Vitamin D deficiency   ? ? ?Tobacco History: ?Social History  ? ?Tobacco Use   ?Smoking Status Never  ?Smokeless Tobacco Never  ? ?Counseling given: Not Answered ? ? ?Outpatient Medications Prior to Visit  ?Medication Sig Dispense Refill  ? acetaminophen (TYLENOL) 500 MG tablet Take 2 tablets (1,000 mg total) by mouth 2 (two) times daily as needed.    ? amLODipine (NORVASC) 2.5 MG tablet TAKE 1 TABLET BY MOUTH ONCE A DAY 30 tablet 5  ? amoxicillin (AMOXIL) 500 MG capsule Take 4 capsules (2,000 mg total) by mouth as directed. Take 4 tablets 1 hour prior to dental work, including cleanings. 12 capsule 12  ? aspirin 81 MG tablet Take 1 tablet (81 mg total) by mouth daily. 30 tablet   ? benzocaine-menthol (CHLORASEPTIC) 6-10 MG lozenge Take 1 lozenge by mouth as needed for sore throat. 100 tablet 0  ? Carbidopa-Levodopa ER (SINEMET CR) 25-100 MG tablet controlled release TAKE 2 TABLET BY MOUTH AT 9 AM, 1 TAB AT1 PM AND 1 TAB AT 5 PM 360 tablet 1  ? clonazePAM (KLONOPIN) 0.5 MG tablet TAKE 1/2 A TABLET BY MOUTH AT BEDTIME 45 tablet 1  ? clopidogrel (PLAVIX) 75 MG tablet Take 1 tablet (75 mg total) by mouth daily with breakfast. 90 tablet 0  ? EPINEPHrine 0.3 mg/0.3 mL IJ SOAJ injection Inject 0.3 mg into the muscle as needed for  anaphylaxis.    ? fexofenadine (ALLEGRA) 180 MG tablet Take 180 mg by mouth daily.    ? Fluticasone-Umeclidin-Vilant (TRELEGY ELLIPTA) 100-62.5-25 MCG/ACT AEPB Inhale 1 puff into the lungs daily. 60 each 5  ? gabapentin ([name]) 300 MG capsule TAKE 2 CAPSULES BY MOUTH 3 TIMES DAILY 540 capsule 1  ? pantoprazole (PROTONIX) 40 MG tablet Take 1 tablet (40 mg total) by mouth daily. 90 tablet 2  ? albuterol (VENTOLIN HFA) 108 (90 Base) MCG/ACT inhaler Inhale 2 puffs into the lungs every 6 (six) hours as needed for wheezing or shortness of breath. (Patient not taking: Reported on 03/30/2022) 18 g 1  ? ?No facility-administered medications prior to visit.  ? ? ? ?Review of Systems:  ? ?Constitutional: No weight loss or gain, night sweats, fevers, chills. +fatigue ?HEENT: No  headaches, difficulty swallowing, tooth/dental problems, or sore throat. No sneezing, itching, ear ache, nasal congestion, or post nasal drip ?CV:  +swelling in lower extremities. No chest pain, orthopnea, P

## 2022-03-30 NOTE — Assessment & Plan Note (Signed)
Overall, she seems relatively compensated on current regimen.  I think her DOE symptoms are primarily cardiac related given recent echocardiogram, leg swelling, and similar DOE compared to before her procedure.  She has no other pulmonary complaints.  We can try changing from Trelegy to [facility] to see if she gets better control with this.  Possible she is unable to generate enough airflow for DPI.  She does not wish to repeat any pulmonary function testing.  Walking oximetry without desaturations.  Advised that she continue with cardiopulmonary rehab.  Continue Allegra for trigger prevention. ? ?Patient Instructions  ?Stop Trelegy. Trial Breztri 2 puffs Twice daily. Brush tongue and rinse mouth afterwards  ?Continue Albuterol inhaler 2 puffs or 3 mL neb every 6 hours as needed for shortness of breath or wheezing. Notify if symptoms persist despite rescue inhaler/neb use. ?Continue allegra 180 mg daily for allergies ?Continue protonix 40 mg daily  ? ?Chest x ray today. We will notify you of any abnormal results ? ?Follow up with cardiology as scheduled. ? ?Follow up in 4-6 weeks with Dr. Chase Caller, Janey Greaser or Katie Maygan Koeller,NP. If symptoms do not improve or worsen, please contact office for sooner follow up or seek emergency care. ? ? ?

## 2022-03-30 NOTE — Assessment & Plan Note (Addendum)
Status post mitral valve clip for severe MR.  Originally felt well after procedure; however began having similar shortness of breath symptoms.  Was seen by cardiology end of April with urgent echo performed.  Did show worsening in her mitral valve regurgitation when compared to postop echo.  She also has a severely dilated LA, G2 DD and PFO. She is to follow-up with her cardiologist on Tuesday. ?

## 2022-03-30 NOTE — Patient Instructions (Addendum)
Stop Trelegy. Trial Breztri 2 puffs Twice daily. Brush tongue and rinse mouth afterwards  ?Continue Albuterol inhaler 2 puffs or 3 mL neb every 6 hours as needed for shortness of breath or wheezing. Notify if symptoms persist despite rescue inhaler/neb use. ?Continue allegra 180 mg daily for allergies ?Continue protonix 40 mg daily  ? ?Chest x ray today. We will notify you of any abnormal results ? ?Follow up with cardiology as scheduled. ? ?Follow up in 4-6 weeks with Dr. Chase Caller, Janey Greaser or Katie Adeena Bernabe,NP. If symptoms do not improve or worsen, please contact office for sooner follow up or seek emergency care. ?

## 2022-03-30 NOTE — Assessment & Plan Note (Signed)
CXR in late March showed resolving but persistent right upper lobe opacity.  We will repeat CXR today to ensure resolution.  She does have a history of [name] infection.  No current symptoms consistent with infectious process. ?

## 2022-04-02 DIAGNOSIS — Z48812 Encounter for surgical aftercare following surgery on the circulatory system: Secondary | ICD-10-CM | POA: Diagnosis not present

## 2022-04-02 DIAGNOSIS — Z9889 Other specified postprocedural states: Secondary | ICD-10-CM

## 2022-04-02 DIAGNOSIS — Z954 Presence of other heart-valve replacement: Secondary | ICD-10-CM | POA: Diagnosis not present

## 2022-04-02 NOTE — Progress Notes (Signed)
Daily Session Note ? ?Patient Details  ?Name: Crystal Bass ?MRN: [id number] ?Date of Birth: [birth year] ?Referring Provider:   ?Flowsheet Row Cardiac Rehab from 03/21/2022 in [facility] Cardiac and Pulmonary Rehab  ?Referring Provider [name] MD  ? ?  ? ? ?Encounter Date: 04/02/2022 ? ?Check In: ? Session Check In - 04/02/22 1344   ? ?  ? Check-In  ? Supervising physician immediately available to respond to emergencies See telemetry face sheet for immediately available ER MD   ? Location [facility]-Cardiac & Pulmonary Rehab   ? Staff Present Birdie Sons, MPA, RN;Melissa [place], RDN, LDN;Jessica [place], MA, RCEP, CCRP, Marylynn Pearson, MS, ASCM CEP, Exercise Physiologist   ? Virtual Visit No   ? Medication changes reported     No   ? Fall or balance concerns reported    No   ? Warm-up and Cool-down Performed on first and last piece of equipment   ? Resistance Training Performed Yes   ? VAD Patient? No   ? PAD/SET Patient? No   ?  ? Pain Assessment  ? Currently in Pain? No/denies   ? ?  ?  ? ?  ? ? ? ? ? ?Social History  ? ?Tobacco Use  ?Smoking Status Never  ?Smokeless Tobacco Never  ? ? ?Goals Met:  ?Independence with exercise equipment ?Exercise tolerated well ?No report of concerns or symptoms today ?Strength training completed today ? ?Goals Unmet:  ?Not Applicable ? ?Comments: Pt able to follow exercise prescription today without complaint.  Will continue to monitor for progression. ? ? ? ?Dr. Emily Filbert is Medical Director for [facility].  ?Dr. Ottie Glazier is Medical Director for [facility] Pulmonary Rehabilitation. ?

## 2022-04-03 ENCOUNTER — Ambulatory Visit: Payer: Medicare PPO | Admitting: Cardiovascular Disease

## 2022-04-03 ENCOUNTER — Encounter: Payer: Self-pay | Admitting: Cardiovascular Disease

## 2022-04-03 VITALS — BP 120/60 | HR 76 | Ht 62.0 in | Wt 119.0 lb

## 2022-04-03 DIAGNOSIS — I951 Orthostatic hypotension: Secondary | ICD-10-CM | POA: Diagnosis not present

## 2022-04-03 DIAGNOSIS — Z95818 Presence of other cardiac implants and grafts: Secondary | ICD-10-CM

## 2022-04-03 DIAGNOSIS — G20 Parkinson's disease: Secondary | ICD-10-CM | POA: Diagnosis not present

## 2022-04-03 DIAGNOSIS — Z9889 Other specified postprocedural states: Secondary | ICD-10-CM | POA: Diagnosis not present

## 2022-04-03 DIAGNOSIS — I10 Essential (primary) hypertension: Secondary | ICD-10-CM

## 2022-04-03 NOTE — Patient Instructions (Signed)
Medication Instructions:  Your physician recommends that you continue on your current medications as directed. Please refer to the Current Medication list given to you today.  *If you need a refill on your cardiac medications before your next appointment, please call your pharmacy*   Lab Work: None ordered If you have labs (blood work) drawn today and your tests are completely normal, you will receive your results only by: MyChart Message (if you have MyChart) OR A paper copy in the mail If you have any lab test that is abnormal or we need to change your treatment, we will call you to review the results.   Testing/Procedures: None ordered   Follow-Up: At [facility], you and your health needs are our priority.  As part of our continuing mission to provide you with exceptional heart care, we have created designated Provider Care Teams.  These Care Teams include your primary Cardiologist (physician) and Advanced Practice Providers (APPs -  Physician Assistants and Nurse Practitioners) who all work together to provide you with the care you need, when you need it.  We recommend signing up for the patient portal called "MyChart".  Sign up information is provided on this After Visit Summary.  MyChart is used to connect with patients for Virtual Visits (Telemedicine).  Patients are able to view lab/test results, encounter notes, upcoming appointments, etc.  Non-urgent messages can be sent to your provider as well.   To learn more about what you can do with MyChart, go to [email or web address].    Your next appointment:   4 month(s)  The format for your next appointment:   In Person  Provider:   You may see Muhammad Arida, MD or one of the following Advanced Practice Providers on your designated Care Team:   Christopher Berge, NP Ryan Dunn, PA-C Cadence Furth, PA-C}    Other Instructions N/A  Important Information About Sugar       

## 2022-04-03 NOTE — Progress Notes (Signed)
?  ?Cardiology Office Note ? ? ?Date:  04/03/2022  ? ?ID:  Crystal Bass, DOB 1941/03/01, MRN [id number] ? ?PCP:  [name], MD  ?Cardiologist:   [name], MD  ? ?Chief Complaint  ?Patient presents with  ? Other  ?  3 month f/u c/o sob. Meds reviewed verbally with pt.  ? ? ?  ?History of Present Illness: ?Crystal Bass is a 81 y.o. female who is here today for follow-up visit regarding mitral regurgitation status post mitral valve clip.   ?She has chronic medical conditions that include mitral valve prolapse with regurgitation, exertional dyspnea and orthostatic hypotension in the setting of Parkinson's disease. ?She was diagnosed with Parkinson's in 2015 . She used to be on multiple blood pressure medications which were discontinued due to orthostatic hypotension.   ? ?  ?Echocardiogram in October 2019 showed normal LV systolic function, grade 1 diastolic dysfunction, anterior mitral valve leaflet prolapse with moderate to severe regurgitation.  Left atrium was moderately to severely dilated.   ?  ?She was hospitalized in early December, 2019 with atypical chest pain.  Initial CTA of the chest showed single small pulmonary embolism in the apical segmental branch of the left upper lobe.  There was also evidence of bronchiectasis.  Repeat CT after 2 days showed resolution of the pulmonary embolus.  The patient was treated with Eliquis for 3 months.   ?She had worsening exertional dyspnea in 2022.  Exertional dyspnea.  She underwent an echocardiogram in October 2022 which showed normal LV systolic function, grade 2 diastolic dysfunction, small pericardial effusion and moderate to severe mitral regurgitation due to significant prolapse of the anterior mitral leaflet. ? ?She has history of COPD but she never smoked.   Pulmonary function testing showed minimal obstructive disease. ? ?A right and left cardiac catheterization was done in March which showed normal coronary arteries and normal LV systolic  function.  Mitral regurgitation was moderate to severe by ventricular angiography and severe by hemodynamic evaluation.  Right heart catheterization showed mildly elevated filling pressures, moderate pulmonary hypertension and normal cardiac output.  Giant V waves were noted on pulmonary capillary wedge tracings (34 mm Hg).  ?She underwent mitral valve clip procedure on March 23.  There was mild MR after the procedure.  She initially had some improvement in symptoms but she now reports similar exertional dyspnea.  She had a repeat echocardiogram done last month which showed hyperdynamic LV systolic function with moderate mitral regurgitation.  There was mild tricuspid regurgitation with minimal pulmonary hypertension. ? ?Past Medical History:  ?Diagnosis Date  ? Aneurysm of aorta (HCC)   ? 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  ? Anginal pain ([place])   ? Aortic root aneurysm ([place])   ? Arthritis   ? Asthma   ? Breast cancer ([place]) 2002  ? infiltrative ductal carcinoma   ? Cataract 2019  ? COPD (chronic obstructive pulmonary disease) ([place]) 07/2007  ? Ductal carcinoma of breast, estrogen receptor positive, stage 1 (HCC) 09/16/2012  ? Dyspnea   ? Endometriosis   ? GERD (gastroesophageal reflux disease)   ? Hypercalcemia 09/14/2013  ? Hypertension   ? Lesion of breast 1992  ? right, benign  ? Lung disease   ? secondary to [name] infection  ? Mastalgia 05/1994  ? Osteoporosis, post-menopausal 03/14/2012  ? Ovarian tumor of borderline malignancy, right 2004  ? Pancreatitis   ? secondary to Cholelithiasis  ? Parkinson disease ([place]) 02/10/2015  ? Post-thoracotomy pain  syndrome   ? Pseudomonas pneumonia ([place]) 03/2008  ? Status post implantation of mitral valve leaflet clip 02/15/2022  ? s/p XTW MitraClip x2 by Dr. Burt Knack in the A2/P2 position  ? Traumatic closed fracture of distal clavicle with minimal displacement, left, initial encounter 06/2021  ? [facility]  ? Uterine fibroid   ? Vitamin D deficiency   ? ? ?Past Surgical  History:  ?Procedure Laterality Date  ? ABDOMINAL HYSTERECTOMY    ? & BSO for Mucinous borderline tumor of R ovary 2004  ? APPENDECTOMY  2004  ? BREAST BIOPSY  08/2004  ? right breast-benign  ? BREAST LUMPECTOMY  1992  ? benign  ? BUBBLE STUDY  12/27/2021  ? Procedure: BUBBLE STUDY;  Surgeon: Geralynn Rile, MD;  Location: [place];  Service: Cardiovascular;;  ? CARDIAC CATHETERIZATION  2007  ? essentially negation for significant CAD  ? CHOLECYSTECTOMY  2001  ? COLONOSCOPY  2014  ? Dr [name] , due 2019  ? COLONOSCOPY  09/2018  ? 2 TA removed, int hem, no f/u needed (Dr [name])   ? ESOPHAGOGASTRODUODENOSCOPY  09/2018  ? GERD with esophagitis and stricture dilated, antral gastritis negative for H pylori [name])  ? LUNG BIOPSY  2002  ? [name], Dr [name]  ? MASTECTOMY MODIFIED RADICAL Left 2002  ? oral chemotheraphy (tamoxifen then Armidex) no radiation, Dr.[name]  ? MITRAL VALVE REPAIR N/A 02/15/2022  ? Procedure: MITRAL VALVE REPAIR;  Surgeon: Sherren Mocha, MD;  Location: [place] [facility];  Service: Cardiovascular;  Laterality: N/A;  ? RIGHT/LEFT HEART CATH AND CORONARY ANGIOGRAPHY Bilateral 11/14/2021  ? Procedure: RIGHT/LEFT HEART CATH AND CORONARY ANGIOGRAPHY;  Surgeon: Wellington Hampshire, MD;  Location: [place] [facility];  Service: Cardiovascular;  Laterality: Bilateral;  ? TEE WITHOUT CARDIOVERSION N/A 12/29/2018  ? Procedure: TRANSESOPHAGEAL ECHOCARDIOGRAM (TEE);  Surgeon: Wellington Hampshire, MD;  Location: [facility];  Service: Cardiovascular;  Laterality: N/A;  ? TEE WITHOUT CARDIOVERSION N/A 12/27/2021  ? Procedure: TRANSESOPHAGEAL ECHOCARDIOGRAM (TEE);  Surgeon: Geralynn Rile, MD;  Location: [place];  Service: Cardiovascular;  Laterality: N/A;  ? TEE WITHOUT CARDIOVERSION N/A 02/15/2022  ? Procedure: TRANSESOPHAGEAL ECHOCARDIOGRAM (TEE);  Surgeon: Sherren Mocha, MD;  Location: [place] [facility];  Service: Cardiovascular;  Laterality: N/A;  ? TONSILLECTOMY  1946  ? ? ? ?Current  Outpatient Medications  ?Medication Sig Dispense Refill  ? acetaminophen (TYLENOL) 500 MG tablet Take 2 tablets (1,000 mg total) by mouth 2 (two) times daily as needed.    ? albuterol (VENTOLIN HFA) 108 (90 Base) MCG/ACT inhaler Inhale 2 puffs into the lungs every 6 (six) hours as needed for wheezing or shortness of breath. 18 g 1  ? amLODipine (NORVASC) 2.5 MG tablet TAKE 1 TABLET BY MOUTH ONCE A DAY 30 tablet 5  ? amoxicillin (AMOXIL) 500 MG capsule Take 4 capsules (2,000 mg total) by mouth as directed. Take 4 tablets 1 hour prior to dental work, including cleanings. 12 capsule 12  ? aspirin 81 MG tablet Take 1 tablet (81 mg total) by mouth daily. 30 tablet   ? benzocaine-menthol (CHLORASEPTIC) 6-10 MG lozenge Take 1 lozenge by mouth as needed for sore throat. 100 tablet 0  ? Carbidopa-Levodopa ER (SINEMET CR) 25-100 MG tablet controlled release TAKE 2 TABLET BY MOUTH AT 9 AM, 1 TAB AT1 PM AND 1 TAB AT 5 PM 360 tablet 1  ? clonazePAM (KLONOPIN) 0.5 MG tablet TAKE 1/2 A TABLET BY MOUTH AT BEDTIME 45 tablet 1  ?  clopidogrel (PLAVIX) 75 MG tablet Take 1 tablet (75 mg total) by mouth daily with breakfast. 90 tablet 0  ? EPINEPHrine 0.3 mg/0.3 mL IJ SOAJ injection Inject 0.3 mg into the muscle as needed for anaphylaxis.    ? fexofenadine (ALLEGRA) 180 MG tablet Take 180 mg by mouth daily.    ? Fluticasone-Umeclidin-Vilant (TRELEGY ELLIPTA) 100-62.5-25 MCG/ACT AEPB Inhale 1 puff into the lungs daily. 60 each 5  ? gabapentin ([name]) 300 MG capsule TAKE 2 CAPSULES BY MOUTH 3 TIMES DAILY 540 capsule 1  ? pantoprazole (PROTONIX) 40 MG tablet Take 1 tablet (40 mg total) by mouth daily. 90 tablet 2  ? Budeson-Glycopyrrol-Formoterol (BREZTRI AEROSPHERE) 160-9-4.8 MCG/ACT AERO Inhale 2 puffs into the lungs in the morning and at bedtime. (Patient not taking: Reported on 04/03/2022) 5.9 g 0  ? ?No current facility-administered medications for this visit.  ? ? ?Allergies:   Sulfa antibiotics, Sulfonamide derivatives, Topamax  [topiramate], Biaxin [clarithromycin], Hydrocodone, Motrin [ibuprofen], Symbicort [budesonide-formoterol fumarate], Percocet [oxycodone-acetaminophen], Tape, and Tetracyclines & related  ? ? ?Social History:  [place] p

## 2022-04-05 DIAGNOSIS — Z9889 Other specified postprocedural states: Secondary | ICD-10-CM

## 2022-04-05 DIAGNOSIS — Z48812 Encounter for surgical aftercare following surgery on the circulatory system: Secondary | ICD-10-CM | POA: Diagnosis not present

## 2022-04-05 DIAGNOSIS — Z954 Presence of other heart-valve replacement: Secondary | ICD-10-CM | POA: Diagnosis not present

## 2022-04-05 NOTE — Progress Notes (Signed)
Daily Session Note ? ?Patient Details  ?Name: Crystal Bass ?MRN: [id number] ?Date of Birth: July 15, 1941 ?Referring Provider:   ?Flowsheet Row Cardiac Rehab from 03/21/2022 in [name] Cardiac and Pulmonary Rehab  ?Referring Provider [name] MD  ? ?  ? ? ?Encounter Date: 04/05/2022 ? ?Check In: ? Session Check In - 04/05/22 1345   ? ?  ? Check-In  ? Supervising physician immediately available to respond to emergencies See telemetry face sheet for immediately available ER MD   ? Location [facility]-Cardiac & Pulmonary Rehab   ? Staff Present Alberteen Sam, MA, RCEP, CCRP, CCET;Mitchelle Goerner, RN, BSN;Joseph [place], [place]   ? Virtual Visit No   ? Medication changes reported     No   ? Fall or balance concerns reported    No   ? Warm-up and Cool-down Performed on first and last piece of equipment   ? Resistance Training Performed Yes   ? VAD Patient? No   ? PAD/SET Patient? No   ?  ? Pain Assessment  ? Currently in Pain? No/denies   ? ?  ?  ? ?  ? ? ? ? ? ?Social History  ? ?Tobacco Use  ?Smoking Status Never  ?Smokeless Tobacco Never  ? ? ?Goals Met:  ?Independence with exercise equipment ?Exercise tolerated well ?No report of concerns or symptoms today ?Strength training completed today ? ?Goals Unmet:  ?Not Applicable ? ?Comments: Pt able to follow exercise prescription today without complaint.  Will continue to monitor for progression. ? ? ?Dr. Emily Filbert is Medical Director for [place].  ?Dr. Ottie Glazier is Medical Director for [facility] Pulmonary Rehabilitation. ?

## 2022-04-06 ENCOUNTER — Ambulatory Visit
Admission: RE | Admit: 2022-04-06 | Discharge: 2022-04-06 | Disposition: A | Discharge status: Home / Self Care | Payer: Medicare PPO | Source: Ambulatory Visit | Attending: Family Medicine | Admitting: Family Medicine

## 2022-04-06 DIAGNOSIS — M810 Age-related osteoporosis without current pathological fracture: Secondary | ICD-10-CM | POA: Diagnosis not present

## 2022-04-06 DIAGNOSIS — Z780 Asymptomatic menopausal state: Secondary | ICD-10-CM | POA: Diagnosis not present

## 2022-04-06 DIAGNOSIS — M8588 Other specified disorders of bone density and structure, other site: Secondary | ICD-10-CM | POA: Diagnosis not present

## 2022-04-09 DIAGNOSIS — Z48812 Encounter for surgical aftercare following surgery on the circulatory system: Secondary | ICD-10-CM | POA: Diagnosis not present

## 2022-04-09 DIAGNOSIS — Z9889 Other specified postprocedural states: Secondary | ICD-10-CM

## 2022-04-09 DIAGNOSIS — Z954 Presence of other heart-valve replacement: Secondary | ICD-10-CM | POA: Diagnosis not present

## 2022-04-09 NOTE — Progress Notes (Signed)
Daily Session Note ? ?Patient Details  ?Name: Crystal Bass ?MRN: [id number] ?Date of Birth: 1941/06/14 ?Referring Provider:   ?Flowsheet Row Cardiac Rehab from 03/21/2022 in [facility] Cardiac and Pulmonary Rehab  ?Referring Provider [name] MD  ? ?  ? ? ?Encounter Date: 04/09/2022 ? ?Check In: ? Session Check In - 04/09/22 1344   ? ?  ? Check-In  ? Supervising physician immediately available to respond to emergencies See telemetry face sheet for immediately available ER MD   ? Location [facility]-Cardiac & Pulmonary Rehab   ? Staff Present Birdie Sons, MPA, Nino Glow, MS, ASCM CEP, Exercise Physiologist;Joseph [place], [place]   ? Virtual Visit No   ? Medication changes reported     No   ? Fall or balance concerns reported    No   ? Warm-up and Cool-down Performed on first and last piece of equipment   ? Resistance Training Performed Yes   ? VAD Patient? No   ? PAD/SET Patient? No   ?  ? Pain Assessment  ? Currently in Pain? No/denies   ? ?  ?  ? ?  ? ? ? ? ? ?Social History  ? ?Tobacco Use  ?Smoking Status Never  ?Smokeless Tobacco Never  ? ? ?Goals Met:  ?Independence with exercise equipment ?Exercise tolerated well ?No report of concerns or symptoms today ?Strength training completed today ? ?Goals Unmet:  ?Not Applicable ? ?Comments: Pt able to follow exercise prescription today without complaint.  Will continue to monitor for progression. ? ? ? ?Dr. Emily Filbert is Medical Director for [place].  ?Dr. Ottie Glazier is Medical Director for [facility] Pulmonary Rehabilitation. ?

## 2022-04-11 ENCOUNTER — Encounter: Payer: Self-pay | Admitting: *Deleted

## 2022-04-11 DIAGNOSIS — Z9889 Other specified postprocedural states: Secondary | ICD-10-CM

## 2022-04-11 DIAGNOSIS — R002 Palpitations: Secondary | ICD-10-CM | POA: Diagnosis not present

## 2022-04-11 DIAGNOSIS — Z48812 Encounter for surgical aftercare following surgery on the circulatory system: Secondary | ICD-10-CM | POA: Diagnosis not present

## 2022-04-11 DIAGNOSIS — Z954 Presence of other heart-valve replacement: Secondary | ICD-10-CM | POA: Diagnosis not present

## 2022-04-11 NOTE — Progress Notes (Signed)
Cardiac Individual Treatment Plan ? ?Patient Details  ?Name: Crystal Bass ?MRN: [id number] ?Date of Birth: January 27, 1941 ?Referring Provider:   ?Flowsheet Row Cardiac Rehab from 03/21/2022 in [facility] Cardiac and Pulmonary Rehab  ?Referring Provider [name] MD  ? ?  ? ? ?Initial Encounter Date:  ?Flowsheet Row Cardiac Rehab from 03/21/2022 in [facility] Cardiac and Pulmonary Rehab  ?Date 03/21/22  ? ?  ? ? ?Visit Diagnosis: S/P mitral valve repair ? ?Patient's Home Medications on Admission: ? ?Current Outpatient Medications:  ?  acetaminophen (TYLENOL) 500 MG tablet, Take 2 tablets (1,000 mg total) by mouth 2 (two) times daily as needed., Disp: , Rfl:  ?  albuterol (VENTOLIN HFA) 108 (90 Base) MCG/ACT inhaler, Inhale 2 puffs into the lungs every 6 (six) hours as needed for wheezing or shortness of breath., Disp: 18 g, Rfl: 1 ?  amLODipine (NORVASC) 2.5 MG tablet, TAKE 1 TABLET BY MOUTH ONCE A DAY, Disp: 30 tablet, Rfl: 5 ?  amoxicillin (AMOXIL) 500 MG capsule, Take 4 capsules (2,000 mg total) by mouth as directed. Take 4 tablets 1 hour prior to dental work, including cleanings., Disp: 12 capsule, Rfl: 12 ?  aspirin 81 MG tablet, Take 1 tablet (81 mg total) by mouth daily., Disp: 30 tablet, Rfl:  ?  benzocaine-menthol (CHLORASEPTIC) 6-10 MG lozenge, Take 1 lozenge by mouth as needed for sore throat., Disp: 100 tablet, Rfl: 0 ?  Budeson-Glycopyrrol-Formoterol (BREZTRI AEROSPHERE) 160-9-4.8 MCG/ACT AERO, Inhale 2 puffs into the lungs in the morning and at bedtime. (Patient not taking: Reported on 04/03/2022), Disp: 5.9 g, Rfl: 0 ?  Carbidopa-Levodopa ER (SINEMET CR) 25-100 MG tablet controlled release, TAKE 2 TABLET BY MOUTH AT 9 AM, 1 TAB AT1 PM AND 1 TAB AT 5 PM, Disp: 360 tablet, Rfl: 1 ?  clonazePAM (KLONOPIN) 0.5 MG tablet, TAKE 1/2 A TABLET BY MOUTH AT BEDTIME, Disp: 45 tablet, Rfl: 1 ?  clopidogrel (PLAVIX) 75 MG tablet, Take 1 tablet (75 mg total) by mouth daily with breakfast., Disp: 90 tablet, Rfl: 0 ?  EPINEPHrine  0.3 mg/0.3 mL IJ SOAJ injection, Inject 0.3 mg into the muscle as needed for anaphylaxis., Disp: , Rfl:  ?  fexofenadine (ALLEGRA) 180 MG tablet, Take 180 mg by mouth daily., Disp: , Rfl:  ?  Fluticasone-Umeclidin-Vilant (TRELEGY ELLIPTA) 100-62.5-25 MCG/ACT AEPB, Inhale 1 puff into the lungs daily., Disp: 60 each, Rfl: 5 ?  gabapentin ([name]) 300 MG capsule, TAKE 2 CAPSULES BY MOUTH 3 TIMES DAILY, Disp: 540 capsule, Rfl: 1 ?  pantoprazole (PROTONIX) 40 MG tablet, Take 1 tablet (40 mg total) by mouth daily., Disp: 90 tablet, Rfl: 2 ? ?Past Medical History: ?Past Medical History:  ?Diagnosis Date  ? Aneurysm of aorta (HCC)   ? 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  ? Anginal pain ([place])   ? Aortic root aneurysm ([place])   ? Arthritis   ? Asthma   ? Breast cancer ([place]) 2002  ? infiltrative ductal carcinoma   ? Cataract 2019  ? COPD (chronic obstructive pulmonary disease) ([place]) 07/2007  ? Ductal carcinoma of breast, estrogen receptor positive, stage 1 (HCC) 09/16/2012  ? Dyspnea   ? Endometriosis   ? GERD (gastroesophageal reflux disease)   ? Hypercalcemia 09/14/2013  ? Hypertension   ? Lesion of breast 1992  ? right, benign  ? Lung disease   ? secondary to [name] infection  ? Mastalgia 05/1994  ? Osteoporosis, post-menopausal 03/14/2012  ? Ovarian tumor of borderline malignancy, right 2004  ?  Pancreatitis   ? secondary to Cholelithiasis  ? Parkinson disease ([place]) 02/10/2015  ? Post-thoracotomy pain syndrome   ? Pseudomonas pneumonia ([place]) 03/2008  ? Status post implantation of mitral valve leaflet clip 02/15/2022  ? s/p XTW MitraClip x2 by Dr. Burt Knack in the A2/P2 position  ? Traumatic closed fracture of distal clavicle with minimal displacement, left, initial encounter 06/2021  ? [facility]  ? Uterine fibroid   ? Vitamin D deficiency   ? ? ?Tobacco Use: ?Social History  ? ?Tobacco Use  ?Smoking Status Never  ?Smokeless Tobacco Never  ? ? ?Labs: ?Review Flowsheet   ? ?  ?  Latest Ref Rng & Units 01/14/2018 01/19/2019  01/22/2020 08/08/2021  ?Labs for [facility]P Cardiac and Pulmonary Rehab  ?Cholestrol 0 - 200 mg/dL 172   181   176   171    ?LDL (calc) 0 - 99 mg/dL 95   101   102   97    ?HDL-C >39.00 mg/dL 44.90   57.40   48.50   53.30    ?Trlycerides 0.0 - 149.0 mg/dL 161.0   115.0   126.0   100.0    ?Hemoglobin A1c 4.6 - 6.5 % 5.3       ?PH, Arterial 7.35 - 7.45      ?PCO2 arterial 32 - 48 mmHg      ?Bicarbonate 20.0 - 28.0 mmol/L      ?O2 Saturation %      ? ?  02/12/2022  ?Labs for [facility] Cardiac and Pulmonary Rehab  ?Cholestrol   ?LDL (calc)   ?HDL-C   ?Trlycerides   ?Hemoglobin A1c   ?PH, Arterial 7.43    ?PCO2 arterial 40    ?Bicarbonate 26.5    ?O2 Saturation 98.7    ?  ?  ?  ? ? ? ?Exercise Target Goals: ?Exercise Program Goal: ?Individual exercise prescription set using results from initial 6 min walk test and THRR while considering  patient?s activity barriers and safety.  ? ?Exercise Prescription Goal: ?Initial exercise prescription builds to 30-45 minutes a day of aerobic activity, 2-3 days per week.  Home exercise guidelines will be given to patient during program as part of exercise prescription that the participant will acknowledge. ? ? ?Education: Aerobic Exercise: ?- Group verbal and visual presentation on the components of exercise prescription. Introduces F.I.T.T principle from ACSM for exercise prescriptions.  Reviews F.I.T.T. principles of aerobic exercise including progression. Written material given at graduation. ?Flowsheet Row Cardiac Rehab from 03/21/2022 in [facility] Cardiac and Pulmonary Rehab  ?Education need identified 03/21/22  ? ?  ? ? ?Education: Resistance Exercise: ?- Group verbal and visual presentation on the components of exercise prescription. Introduces F.I.T.T principle from ACSM for exercise prescriptions  Reviews F.I.T.T. principles of resistance exercise including progression. Written material given at graduation. ? ?  ?Education: Exercise & Equipment Safety: ?- Individual verbal instruction and  demonstration of equipment use and safety with use of the equipment. ?Flowsheet Row Cardiac Rehab from 03/21/2022 in [facility] Cardiac and Pulmonary Rehab  ?Date 03/21/22  ?Educator [facility]  ?Instruction Review Code 1- Verbalizes Understanding  ? ?  ? ? ?Education: Exercise Physiology & General Exercise Guidelines: ?- Group verbal and written instruction with models to review the exercise physiology of the cardiovascular system and associated critical values. Provides general exercise guidelines with specific guidelines to those with heart or lung disease.  ? ? ?Education: Flexibility, Balance, Mind/Body Relaxation: ?- Group verbal and visual presentation with interactive activity on the  components of exercise prescription. Introduces F.I.T.T principle from ACSM for exercise prescriptions. Reviews F.I.T.T. principles of flexibility and balance exercise training including progression. Also discusses the mind body connection.  Reviews various relaxation techniques to help reduce and manage stress (i.e. Deep breathing, progressive muscle relaxation, and visualization). Balance handout provided to take home. Written material given at graduation. ? ? ?Activity Barriers & Risk Stratification: ? Activity Barriers & Cardiac Risk Stratification - 03/21/22 1506   ? ?  ? Activity Barriers & Cardiac Risk Stratification  ? Activity Barriers Shortness of Breath;Neck/Spine Problems;Joint Problems;Other (comment);Deconditioning;Muscular Weakness;Balance Concerns;History of Falls;Arthritis;Back Problems   ? Comments Parkinsons   ? Cardiac Risk Stratification Moderate   ? ?  ?  ? ?  ? ? ?6 Minute Walk: ? 6 Minute Walk   ? ? [place] Name 03/21/22 1504  ?  ?  ?  ? 6 Minute Walk  ? Phase Initial    ? Distance 800 feet    ? Walk Time 6 minutes    ? # of Rest Breaks 0    ? MPH 1.51    ? METS 1.61    ? RPE 16    ? Perceived Dyspnea  3    ? VO2 Peak 5.64    ? Symptoms Yes (comment)    ? Comments SOB, fatigue    ? Resting HR 67 bpm    ? Resting BP 110/64     ? Resting Oxygen Saturation  97 %    ? Exercise Oxygen Saturation  during 6 min walk 91 %    ? Max Ex. HR 86 bpm    ? Max Ex. BP 128/70    ? 2 Minute Post BP 114/62    ?  ? Interval HR  ? 1 Minute HR 79    ? 2 M

## 2022-04-11 NOTE — Progress Notes (Signed)
Daily Session Note ? ?Patient Details  ?Name: Crystal Bass ?MRN: [id number] ?Date of Birth: 1941-01-25 ?Referring Provider:   ?Flowsheet Row Cardiac Rehab from 03/21/2022 in [facility] Cardiac and Pulmonary Rehab  ?Referring Provider [name] MD  ? ?  ? ? ?Encounter Date: 04/11/2022 ? ?Check In: ? Session Check In - 04/11/22 1329   ? ?  ? Check-In  ? Supervising physician immediately available to respond to emergencies See telemetry face sheet for immediately available ER MD   ? Location [facility]-Cardiac & Pulmonary Rehab   ? Staff Present Birdie Sons, MPA, RN;Joseph [place], RCP,RRT,BSRT;Melissa [place], RDN, LDN   ? Virtual Visit No   ? Medication changes reported     No   ? Fall or balance concerns reported    No   ? Warm-up and Cool-down Performed on first and last piece of equipment   ? Resistance Training Performed Yes   ? VAD Patient? No   ? PAD/SET Patient? No   ?  ? Pain Assessment  ? Currently in Pain? No/denies   ? ?  ?  ? ?  ? ? ? ? ? ?Social History  ? ?Tobacco Use  ?Smoking Status Never  ?Smokeless Tobacco Never  ? ? ?Goals Met:  ?Independence with exercise equipment ?Exercise tolerated well ?No report of concerns or symptoms today ?Strength training completed today ? ?Goals Unmet:  ?Not Applicable ? ?Comments: Pt able to follow exercise prescription today without complaint.  Will continue to monitor for progression. ? ? ? ?Dr. Emily Filbert is Medical Director for [place].  ?Dr. Ottie Glazier is Medical Director for [facility] Pulmonary Rehabilitation. ?

## 2022-04-12 DIAGNOSIS — Z48812 Encounter for surgical aftercare following surgery on the circulatory system: Secondary | ICD-10-CM | POA: Diagnosis not present

## 2022-04-12 DIAGNOSIS — Z954 Presence of other heart-valve replacement: Secondary | ICD-10-CM | POA: Diagnosis not present

## 2022-04-12 DIAGNOSIS — Z9889 Other specified postprocedural states: Secondary | ICD-10-CM

## 2022-04-12 NOTE — Progress Notes (Signed)
Daily Session Note  Patient Details  Name: Crystal Bass MRN: [id number] Date of Birth: 1941-04-02 Referring Provider:   Flowsheet Row Cardiac Rehab from 03/21/2022 in [facility] Cardiac and Pulmonary Rehab  Referring Provider [name] MD       Encounter Date: 04/12/2022  Check In:  Session Check In - 04/12/22 1342       Check-In   Supervising physician immediately available to respond to emergencies See telemetry face sheet for immediately available ER MD    Staff Present Alberteen Sam, MA, RCEP, CCRP, CCET;Calle Schader, RN, BSN;Joseph [place], [place]    Virtual Visit No    Medication changes reported     No    Fall or balance concerns reported    No    Tobacco Cessation No Change    Warm-up and Cool-down Performed on first and last piece of equipment    Resistance Training Performed Yes    VAD Patient? No    PAD/SET Patient? No      Pain Assessment   Currently in Pain? No/denies                Social History   Tobacco Use  Smoking Status Never  Smokeless Tobacco Never    Goals Met:  Proper associated with RPD/PD & O2 Sat Independence with exercise equipment Exercise tolerated well No report of concerns or symptoms today Strength training completed today  Goals Unmet:  Not Applicable  Comments: Pt able to follow exercise prescription today without complaint.  Will continue to monitor for progression.   Dr. Emily Filbert is Medical Director for [place].  Dr. Ottie Glazier is Medical Director for [facility] Pulmonary Rehabilitation.

## 2022-04-16 DIAGNOSIS — Z954 Presence of other heart-valve replacement: Secondary | ICD-10-CM | POA: Diagnosis not present

## 2022-04-16 DIAGNOSIS — Z48812 Encounter for surgical aftercare following surgery on the circulatory system: Secondary | ICD-10-CM | POA: Diagnosis not present

## 2022-04-16 DIAGNOSIS — Z9889 Other specified postprocedural states: Secondary | ICD-10-CM

## 2022-04-16 NOTE — Progress Notes (Unsigned)
Assessment/Plan:   1.  Parkinsons Disease  -Continue carbidopa/levodopa 25/100 CR, 2/1/1.  Changing from IR did not change hypersomnolence, but I did not change back because of orthostasis. 2.  EDS             -don't think that related to PD             -this is the biggest c/o.     3.  Orthostatic hypotension, with history of associated syncope             -Refuses compression binder.             -Previously prescribed Northera but did not take.    -Patient is on blood pressure medications, but does not want to change that given her worry about her thoracic aortic aneurysm.  She does not want to stop her amlodopine.  States that when she did this in the past, her blood pressure raised up too high.  Discussed the concept of permissive hypertension with her with Parkinson's disease. 4.  REM behavior disorder              -Continue clonazepam 0.5 mg, half tablet at bedtime. 5.  Mild cognitive impairment             -Last neurocognitive testing was in September, 2018.  I have not seen significant change since that time.  She is noting some worsening of memory and discussed repeating but she wants to hold for now 6.  Severe mitral valve stenosis  -Status post mitral valve repair in March, 2023  -Post repair, patient with moderate mitral valve regurgitation.  -Patient continues with exertional dyspnea/shortness of breath.  -Patient is in cardiac rehab  Subjective:   Crystal Bass was seen today in follow up for Parkinsons disease.  My previous records were reviewed prior to todays visit as well as outside records available to me.  Patient did have mitral valve repair done since our last visit.  This was done in March, 2023.  She is now in cardiac rehab.  Patient was hoping the procedure would help her shortness of breath.  Medical records indicate that it initially did, but now she reports similar exertional dyspnea.  Cardiology records indicate patient with moderate mitral regurgitation.   Pt states that she is hoping to have another repeat surgery to see if she can have better results.  She is going to talk with cardiology about that.  No lightheadedness/near syncope.    Current prescribed movement disorder medications: Carbidopa/levodopa 25/100 CR, 2 tablets in the morning, 1 in the afternoon and 1 in the evening Clonazepam 0.5 mg, half tablet at night   PREVIOUS MEDICATIONS: Sinemet (changed from IR due to EDS but not sure it really made a difference but we did not change back because of [place]); northera (RX by cardiology but not taken)  ALLERGIES:   Allergies  Allergen Reactions   Sulfa Antibiotics Anaphylaxis   Sulfonamide Derivatives Anaphylaxis   Topamax [Topiramate] Other (See Comments)    Metabolic acidosis    Biaxin [Clarithromycin] Other (See Comments)    pericarditis   Hydrocodone Other (See Comments)    "hyper and climbing the walls"   Motrin [Ibuprofen] Other (See Comments)    headaches   Symbicort [Budesonide-Formoterol Fumarate] Other (See Comments)    02/07/15 tremor   Percocet [Oxycodone-Acetaminophen] Itching and Rash   Tape Itching and Rash    Use paper tape only   Tetracyclines & Related Other (See  Comments)    "immediate yeast infection"    CURRENT MEDICATIONS:  Outpatient Encounter Medications as of 04/17/2022  Medication Sig   acetaminophen (TYLENOL) 500 MG tablet Take 2 tablets (1,000 mg total) by mouth 2 (two) times daily as needed.   albuterol (VENTOLIN HFA) 108 (90 Base) MCG/ACT inhaler Inhale 2 puffs into the lungs every 6 (six) hours as needed for wheezing or shortness of breath.   amLODipine (NORVASC) 2.5 MG tablet TAKE 1 TABLET BY MOUTH ONCE A DAY   amoxicillin (AMOXIL) 500 MG capsule Take 4 capsules (2,000 mg total) by mouth as directed. Take 4 tablets 1 hour prior to dental work, including cleanings.   aspirin 81 MG tablet Take 1 tablet (81 mg total) by mouth daily.   benzocaine-menthol (CHLORASEPTIC) 6-10 MG lozenge Take 1 lozenge  by mouth as needed for sore throat.   Budeson-Glycopyrrol-Formoterol (BREZTRI AEROSPHERE) 160-9-4.8 MCG/ACT AERO Inhale 2 puffs into the lungs in the morning and at bedtime. (Patient not taking: Reported on 04/03/2022)   Carbidopa-Levodopa ER (SINEMET CR) 25-100 MG tablet controlled release TAKE 2 TABLET BY MOUTH AT 9 AM, 1 TAB AT1 PM AND 1 TAB AT 5 PM   clonazePAM (KLONOPIN) 0.5 MG tablet TAKE 1/2 A TABLET BY MOUTH AT BEDTIME   clopidogrel (PLAVIX) 75 MG tablet Take 1 tablet (75 mg total) by mouth daily with breakfast.   EPINEPHrine 0.3 mg/0.3 mL IJ SOAJ injection Inject 0.3 mg into the muscle as needed for anaphylaxis.   fexofenadine (ALLEGRA) 180 MG tablet Take 180 mg by mouth daily.   Fluticasone-Umeclidin-Vilant (TRELEGY ELLIPTA) 100-62.5-25 MCG/ACT AEPB Inhale 1 puff into the lungs daily.   gabapentin ([name]) 300 MG capsule TAKE 2 CAPSULES BY MOUTH 3 TIMES DAILY   pantoprazole (PROTONIX) 40 MG tablet Take 1 tablet (40 mg total) by mouth daily.   No facility-administered encounter medications on file as of 04/17/2022.    Objective:   PHYSICAL EXAMINATION:    VITALS:   Vitals:   04/17/22 1451  BP: 120/78  Pulse: 75  SpO2: 98%  Weight: 119 lb (54 kg)  Height: '5\' 2"'$  (1.575 m)      GEN:  The patient appears stated age and is in NAD. HEENT:  Normocephalic, atraumatic.  The mucous membranes are moist. The superficial temporal arteries are without ropiness or tenderness. CV:  RRR Lungs:  CTAB.  There is some DOE Neck/HEME:  There are no carotid bruits bilaterally.  Neurological examination:  Orientation: The patient is alert and oriented x3. Cranial nerves: There is good facial symmetry with facial hypomimia. The speech is fluent and clear. Soft palate rises symmetrically and there is no tongue deviation. Hearing is intact to conversational tone. Sensation: Sensation is intact to light touch throughout Motor: Strength is at least antigravity x4.  Movement examination: Tone:  There is normal tone in the UE/LE Abnormal movements: Rare tremor of the left hand. Coordination:  There is mild decremation with finger taps on the right and heel/toe taps bilaterally Gait and Station: The patient has no difficulty arising out of a deep-seated chair without the use of the hands. The patient's stride length is good   I have reviewed and interpreted the following labs independently    Chemistry      Component Value Date/Time   NA 140 03/23/2022 1221   NA 140 12/19/2021 1246   NA 141 03/28/2017 1130   K 3.5 03/23/2022 1221   K 4.2 03/28/2017 1130   CL 104 03/23/2022 1221  CL 102 03/10/2013 1529   CO2 29 03/23/2022 1221   CO2 28 03/28/2017 1130   BUN 19 03/23/2022 1221   BUN 15 12/19/2021 1246   BUN 17.3 03/28/2017 1130   CREATININE 0.85 03/23/2022 1221   CREATININE 0.8 03/28/2017 1130      Component Value Date/Time   CALCIUM 9.2 03/23/2022 1221   CALCIUM 9.6 03/28/2017 1130   ALKPHOS 63 02/12/2022 1109   ALKPHOS 85 03/28/2017 1130   AST 16 02/12/2022 1109   AST 12 03/28/2017 1130   ALT <5 02/12/2022 1109   ALT <6 03/28/2017 1130   BILITOT 1.2 02/12/2022 1109   BILITOT 1.38 (H) 03/28/2017 1130       Lab Results  Component Value Date   WBC 4.4 03/23/2022   HGB 11.6 (L) 03/23/2022   HCT 35.6 (L) 03/23/2022   MCV 93.9 03/23/2022   PLT 131.0 (L) 03/23/2022    Lab Results  Component Value Date   TSH 0.84 03/23/2022     Total time spent on today's visit was 20 minutes, including both face-to-face time and nonface-to-face time.  Time included that spent on review of records (prior notes available to me/labs/imaging if pertinent), discussing treatment and goals, answering patient's questions and coordinating care.  Cc:  [name], MD

## 2022-04-16 NOTE — Progress Notes (Signed)
Daily Session Note  Patient Details  Name: JULIYAH MERGEN MRN: [id number] Date of Birth: [birth year] Referring Provider:   Flowsheet Row Cardiac Rehab from 03/21/2022 in [facility] Cardiac and Pulmonary Rehab  Referring Provider [name] MD       Encounter Date: 04/16/2022  Check In:  Session Check In - 04/16/22 1321       Check-In   Supervising physician immediately available to respond to emergencies See telemetry face sheet for immediately available ER MD    Location [facility]-Cardiac & Pulmonary Rehab    Staff Present Birdie Sons, MPA, Nino Glow, MS, ASCM CEP, Exercise Physiologist;Joseph Tessie Fass, [place]    Virtual Visit No    Medication changes reported     No    Fall or balance concerns reported    No    Tobacco Cessation No Change    Warm-up and Cool-down Performed on first and last piece of equipment    Resistance Training Performed Yes    VAD Patient? No    PAD/SET Patient? No      Pain Assessment   Currently in Pain? No/denies                Social History   Tobacco Use  Smoking Status Never  Smokeless Tobacco Never    Goals Met:  Independence with exercise equipment Exercise tolerated well No report of concerns or symptoms today Strength training completed today  Goals Unmet:  Not Applicable  Comments: Pt able to follow exercise prescription today without complaint.  Will continue to monitor for progression.    Dr. Emily Filbert is Medical Director for [facility].  Dr. Ottie Glazier is Medical Director for [facility] Pulmonary Rehabilitation.

## 2022-04-17 ENCOUNTER — Encounter: Payer: Self-pay | Admitting: Neurology

## 2022-04-17 ENCOUNTER — Ambulatory Visit: Payer: Medicare PPO | Admitting: Neurology

## 2022-04-17 VITALS — BP 120/78 | HR 75 | Ht 62.0 in | Wt 119.0 lb

## 2022-04-17 DIAGNOSIS — G20 Parkinson's disease: Secondary | ICD-10-CM | POA: Diagnosis not present

## 2022-04-17 NOTE — Patient Instructions (Signed)
You look great!  Local and Online Resources for Power over Parkinson's Group May 2023  LOCAL [place] PARKINSON'S GROUPS  Power over Parkinson's Group:   Power Over Parkinson's Patient Education Group will be Wednesday, May 10th-*Hybrid meting*- in person at [place] location and via [facility] at 2:00 pm.   Upcoming Power over Parkinson's Meetings:  2nd Wednesdays of the month at 2 pm:  May 10th, June 14th, July 12th Contact Crystal Bass at [email or web address] if interested in participating in this group Parkinson's Care Partners Group:    3rd Mondays, Contact Crystal Bass Atypical Parkinsonian Patient Group:   4th Wednesdays, [place] If you are interested in participating in these groups with Crystal, please contact her directly for how to join those meetings.  Her contact information is [email or web address].    LOCAL EVENTS AND NEW OFFERINGS Moving Day [place]:  Saturday, May 6th, 9:30 am at [place], [place], [place]. Participate in Moving Day as a way to "honor loved ones, raise funds, fight Parkinson's disease, and celebrate movement."  Register today at [email or web address]!  Play [place]!  Join [place] for home game for a fun evening to bring awareness of Parkinson's and raise funds for our Movement Disorder Funds. Rescheduled to May 11th  6:30 pm [place]. To purchase tickets:  [email or web address] Parkinson's T-shirts for sale!  Designed by a local group member, with funds going to [place].  $25.00  Investment banker, corporate to purchase  [employer]! Moves [employer] Instructor-Led Class offering at [employer]!  Wednesdays 1-2 pm, starting April 12th.   Contact Crystal Bass, Acupuncturist at [employer].  [email or web address]  [place]:  www.[email or web address] PD Health at Home  continues:  Mindfulness Mondays, Wellness Wednesdays, Fitness Fridays  Upcoming Education:  Understanding Gene and Cell-Based Therapies in Parkinson's.  Wednesday, May 10th at 1:00 pm Additional Education offerings virtually through their website-upcoming topics include Palliative Care/Hospice and PD, Sleep and PD Register for expert briefings (webinars) at [email or web address].si Please check out their website to sign up for emails and see their full online offerings   [employer]:  www.[email or web address]  Third Thursday Webinars:  On the third Thursday of every month at 12 p.m. ET, join our free live webinars to learn about various aspects of living with Parkinson's disease and our work to speed medical breakthroughs. Upcoming Webinar: Get Moving: Exercising for a Healthy Brain.  Thursday, May 18th  at  12 noon. Check out additional information on their website to see their full online offerings  [facility]:  [email or web address] Upcoming Webinar:   Stay tuned Webinar Series:  Living with Parkinson's Meetup.   Third Thursdays each month, 3 pm Care Partner Monthly Meetup.  With Crystal Bass [place].  First Tuesday of each month, 2 pm Check out additional information to Live Well Today on their website  Parkinson and Movement Disorders (PMD) Alliance:  www.pmdalliance.org [employer] Online:  Online Education Events Sign up for emails, which are sent weekly to give you updates on programming and online offerings  Parkinson's Association of the [place]:  www.parkinsonassociation.org Information on online support groups, education events, and online exercises including Yoga, Parkinson's exercises and more-LOTS of information on links to PD resources and online events Virtual Support Group through Parkinson's Association of the [place]; next one is scheduled for Wednesday, May 3rd at 2 pm.  (These are typically scheduled for the 1st Wednesday of the month at 2 pm).  Visit website  for details. MOVEMENT AND EXERCISE OPPORTUNITIES Parkinson's DRUMMING Classes/Music Therapy with Crystal Bass:  This is a returning class and it's FREE!  2nd Mondays, continuing May 8th, 11:00 at the [place].  Contact *Crystal Bass at [email or web address] or Crystal Bass at [phone] or [email or web address]  PWR! Moves Classes at [place].  Wednesdays 10 and 11 am.   Contact Crystal Bass, PT [email or web address] if interested. NEW PWR! Moves Class offering at [employer].  Wednesdays 1-2 pm, starting April 12th.  Contact Crystal Bass, Acupuncturist at [employer].  [email or web address] Here is a link to the PWR!Moves classes on Zoom from [place] - Daily Mon-Sat at 10:00. Via Zoom, FREE and open to all.  There is also a link below via Facebook if you use that platform.  AptDealers.si [email or web address].no  Parkinson's Wellness Recovery (PWR! Moves)  www.pwr4life.org Info on the PWR! Virtual Experience:  You will have access to our expertise through self-assessment, guided plans that start with the PD-specific fundamentals, educational content, tips, Q&A with an expert, and a growing Art therapist of PD-specific pre-recorded and live exercise classes of varying types and intensity - both physical and cognitive! If that is not enough, we offer 1:1 wellness consultations (in-person or virtual) to personalize your PWR! Research scientist (medical).  [place] Fridays:  As part of the PD Health @ Home program, this free video series focuses each week on one aspect of fitness designed to support  people living with Parkinson's.  These weekly videos highlight the [place] recent fitness guidelines for people with Parkinson's disease. ModemGamers.si Dance for PD website is offering free, live-stream classes throughout the week, as well as links to [employer] of classes:  https://danceforparkinsons.org/ Virtual dance and Pilates for Parkinson's classes: Click on the Community Tab> Parkinson's Movement Initiative Tab.  To register for classes and for more information, visit www.[email or web address].co.za and click the "community" tab.  [employer] Parkinson's Cycling Classes  [employer]:  Thursdays @ Noon-Live classes at [employer] ([employer] at [email or web address] or [phone]) [employer]: Virtual Classes Mondays and Thursdays Jeanette Caprice classes Tuesday, Wednesday and Thursday (contact Crystal Bass at [name]  or [phone]) [place] Varied levels of classes are offered Mondays, Tuesdays and Thursdays at [employer].  Stretching with Crystal Bass weekly class is also offered for people with Parkinson's To observe a class or for more information, call [phone] or email Crystal Bass at [email or web address] .[email or web address] ADDITIONAL SUPPORT AND RESOURCES Well[employer]:Online Caregiver Education Opportunities:  www.well-springsolutions.org/caregiver-education/caregiver-support-group.  You may also contact [employer] at [email or web address] or [phone].    Well-Spring Navigator:  10-23-2002 program, a free service to help individuals and families through the journey of determining care for older adults.  The "Navigator" is a [phone], [employer], who will speak with a prospective client and/or loved ones to provide an assessment of the situation and a set of recommendations for a personalized care plan -- all free of charge, and whether  Well[employer]Spring [employer] offers the needed service or not. If the need is not a service we provide, we are well-connected with reputable programs in town that we can refer you to.  www.well-springsolutions.org or to speak with the Navigator, call [phone].

## 2022-04-18 DIAGNOSIS — Z954 Presence of other heart-valve replacement: Secondary | ICD-10-CM | POA: Diagnosis not present

## 2022-04-18 DIAGNOSIS — Z9889 Other specified postprocedural states: Secondary | ICD-10-CM

## 2022-04-18 DIAGNOSIS — Z48812 Encounter for surgical aftercare following surgery on the circulatory system: Secondary | ICD-10-CM | POA: Diagnosis not present

## 2022-04-18 NOTE — Progress Notes (Signed)
Daily Session Note  Patient Details  Name: Crystal Bass MRN: [id number] Date of Birth: November 18, 1941 Referring Provider:   Flowsheet Row Cardiac Rehab from 03/21/2022 in [facility] Cardiac and Pulmonary Rehab  Referring Provider [name] MD       Encounter Date: 04/18/2022  Check In:  Session Check In - 04/18/22 1347       Check-In   Supervising physician immediately available to respond to emergencies See telemetry face sheet for immediately available ER MD    Location [facility]-Cardiac & Pulmonary Rehab    Staff Present Birdie Sons, MPA, RN;Joseph Tessie Fass, [place], MS, ASCM CEP, Exercise Physiologist;Melissa Caiola, RDN, LDN    Virtual Visit No    Medication changes reported     No    Fall or balance concerns reported    No    Tobacco Cessation No Change    Warm-up and Cool-down Performed on first and last piece of equipment    Resistance Training Performed Yes    VAD Patient? No    PAD/SET Patient? No      Pain Assessment   Currently in Pain? No/denies                Social History   Tobacco Use  Smoking Status Never  Smokeless Tobacco Never    Goals Met:  Independence with exercise equipment Exercise tolerated well No report of concerns or symptoms today Strength training completed today  Goals Unmet:  Not Applicable  Comments: Pt able to follow exercise prescription today without complaint.  Will continue to monitor for progression.    Dr. Emily Filbert is Medical Director for [facility].  Dr. Ottie Glazier is Medical Director for [facility] Pulmonary Rehabilitation.

## 2022-04-19 ENCOUNTER — Encounter: Payer: Medicare PPO | Admitting: *Deleted

## 2022-04-19 DIAGNOSIS — Z9889 Other specified postprocedural states: Secondary | ICD-10-CM

## 2022-04-19 DIAGNOSIS — Z954 Presence of other heart-valve replacement: Secondary | ICD-10-CM | POA: Diagnosis not present

## 2022-04-19 DIAGNOSIS — Z48812 Encounter for surgical aftercare following surgery on the circulatory system: Secondary | ICD-10-CM | POA: Diagnosis not present

## 2022-04-19 NOTE — Progress Notes (Signed)
Daily Session Note  Patient Details  Name: Crystal Bass MRN: [id number] Date of Birth: 08-23-1941 Referring Provider:   Flowsheet Row Cardiac Rehab from 03/21/2022 in [facility] Cardiac and Pulmonary Rehab  Referring Provider [name] MD       Encounter Date: 04/19/2022  Check In:  Session Check In - 04/19/22 1347       Check-In   Supervising physician immediately available to respond to emergencies See telemetry face sheet for immediately available ER MD    Location [facility]-Cardiac & Pulmonary Rehab    Staff Present Nyoka Cowden, RN, BSN, [place] Pa, MA, RCEP, CCRP, CCET;Joseph [place], [place]    Virtual Visit No    Medication changes reported     No    Fall or balance concerns reported    No    Tobacco Cessation No Change    Warm-up and Cool-down Performed on first and last piece of equipment    Resistance Training Performed Yes    VAD Patient? No    PAD/SET Patient? No      Pain Assessment   Currently in Pain? No/denies                Social History   Tobacco Use  Smoking Status Never  Smokeless Tobacco Never    Goals Met:  Independence with exercise equipment Exercise tolerated well No report of concerns or symptoms today  Goals Unmet:  Not Applicable  Comments: Pt able to follow exercise prescription today without complaint.  Will continue to monitor for progression.    Dr. Emily Filbert is Medical Director for [facility].  Dr. Ottie Glazier is Medical Director for [facility] Pulmonary Rehabilitation.

## 2022-04-24 NOTE — Progress Notes (Signed)
[facility]                                     Cardiology Office Note:    Date:  05/01/2022   ID:  Crystal Bass, DOB [birth year], MRN [id number]  PCP:  [name], MD  [facility] Cardiologist:  [name], MD /Dr. Burt Knack, MD (TEER) [place] Electrophysiologist:  None   Referring MD: [name], MD   Follow up.   History of Present Illness:    Crystal Bass is a 81 y.o. female with a hx of pulmonary embolus 2019 Rx'd with Eliquis x 3 months, Parkinson's disease complicated by orthostatic hypotension, [name] infection with chronic lung disease, remote mastectomy for breast cancer, and severe mitral regurgitation s/p mTEER 02/15/22 who presents to clinic for follow up.    Crystal Bass lives independently at [facility] retirement facility in [place]. She was very functional at baseline; however, was referred to Dr. Burt Knack after experiencing worsening fatigue and exertional dyspnea x 6 months and diagnosed with severe mitral regurgitation by TTE and cath. [facility] 11/14/21 showed no CAD and severe mitral regurgitation with pulmonary vein flow reversal and large V waves.  Subsequent TEE 12/27/21 showed severe mitral regurgitation with marked prolapse of the A2 scallop of the anterior mitral leaflet and severe associated mitral regurgitation (Barlow valve).  She did not have any clear evidence of a flail leaflet.    The patient was evaluated by the multidisciplinary valve team and underwent successful mTEER using 2 Mitraclip XTW devices, reducing MR from 4+ to 1+, both Clips positioned A2/P2. Intraoperative echo showed mild residual grade 1 MR at end of case with good tissue bridge on 3D imaging Mean gradient 5 peak 8 mmHg across MV at HR of 64 bpm. POD1 echo showed EF 60% with a slight increase in MR to mild to moderate. Plan was for ASA and Plavix for at least 3 months (05/18/22) then monotherapy with aspirin alone.     She was seen in [facility] follow up after TEER with significant dyspnea on exertion with concern for possible clip detachment. Her murmur was much more pronounced although with no other overt HF symptoms. Dr. Burt Knack saw her and agreed with the need for an urgent echocardiogram, performed 02/27/22. This showed stable clip placement with mild to moderate MR. In follow up, she continued to have SOB with minimal exertion although was walking 1/2 mile, twice daily for exercise. She did have complaints of brief palpitations at times and was set up to wear a ZIO. This showed two short episodes of VT and occasional PVCs but otherwise was stable. She has been working with cardiopulmonary rehab. She was recently seen by pulm and felt to be stable from their standpoint. 1 month echocardiogram s/p mTEER showed EF 65-70%, s/p MtriaClip with 2 XTW clips (A2/P2 and A3/P3) with moderate eccentric MR directed posteriorly along wall of LV.   Today she presents for follow up. She is here alone. She wants to know "next steps." She feels terrible. She felt better immediately after her clip surgery but then had a return of debilitating symptoms. She cannot walk across the room without shortness of breath or extreme fatigue. She is working with cardiac rehab with minimal improvement. Has has some mild LE edema. No orthopnea or PND. No dizziness or syncope. No chest pain.   Past Medical  History:  Diagnosis Date   Aneurysm of aorta ([place])    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   Anginal pain ([place])    Aortic root aneurysm ([facility])    Arthritis    Asthma    Breast cancer ([place]) 2002   infiltrative ductal carcinoma    Cataract 2019   COPD (chronic obstructive pulmonary disease) ([place]) 07/2007   Ductal carcinoma of breast, estrogen receptor positive, stage 1 ([place]) 09/16/2012   Dyspnea    Endometriosis    GERD (gastroesophageal reflux disease)    Hypercalcemia 09/14/2013   Hypertension    Lesion of breast 1992   right, benign   Lung  disease    secondary to [name] infection   Mastalgia 05/1994   Osteoporosis, post-menopausal 03/14/2012   Ovarian tumor of borderline malignancy, right 2004   Pancreatitis    secondary to Cholelithiasis   Parkinson disease ([place]) 02/10/2015   Post-thoracotomy pain syndrome    Pseudomonas pneumonia ([place]) 03/2008   Status post implantation of mitral valve leaflet clip 02/15/2022   s/p XTW MitraClip x2 by Dr. Burt Knack in the A2/P2 position   Traumatic closed fracture of distal clavicle with minimal displacement, left, initial encounter 06/2021   [facility]   Uterine fibroid    Vitamin D deficiency     Past Surgical History:  Procedure Laterality Date   ABDOMINAL HYSTERECTOMY     & BSO for Mucinous borderline tumor of R ovary 2004   APPENDECTOMY  2004   BREAST BIOPSY  08/2004   right breast-benign   BREAST LUMPECTOMY  1992   benign   BUBBLE STUDY  12/27/2021   Procedure: BUBBLE STUDY;  Surgeon: Geralynn Rile, MD;  Location: [facility] ENDOSCOPY;  Service: Cardiovascular;;   CARDIAC CATHETERIZATION  2007   essentially negation for significant CAD   CHOLECYSTECTOMY  2001   COLONOSCOPY  2014   Dr [name] , due 2019   COLONOSCOPY  09/2018   2 TA removed, int hem, no f/u needed (Dr [name])    ESOPHAGOGASTRODUODENOSCOPY  09/2018   GERD with esophagitis and stricture dilated, antral gastritis negative for H pylori [name])   LUNG BIOPSY  2002   [name], Dr [name]   MASTECTOMY MODIFIED RADICAL Left 2002   oral chemotheraphy (tamoxifen then Armidex) no radiation, Dr.[name]   MITRAL VALVE REPAIR N/A 02/15/2022   Procedure: MITRAL VALVE REPAIR;  Surgeon: Sherren Mocha, MD;  Location: [place] CV LAB;  Service: Cardiovascular;  Laterality: N/A;   RIGHT/LEFT HEART CATH AND CORONARY ANGIOGRAPHY Bilateral 11/14/2021   Procedure: RIGHT/LEFT HEART CATH AND CORONARY ANGIOGRAPHY;  Surgeon: Wellington Hampshire, MD;  Location: [place] CV LAB;  Service: Cardiovascular;  Laterality: Bilateral;    TEE WITHOUT CARDIOVERSION N/A 12/29/2018   Procedure: TRANSESOPHAGEAL ECHOCARDIOGRAM (TEE);  Surgeon: Wellington Hampshire, MD;  Location: [facility] ORS;  Service: Cardiovascular;  Laterality: N/A;   TEE WITHOUT CARDIOVERSION N/A 12/27/2021   Procedure: TRANSESOPHAGEAL ECHOCARDIOGRAM (TEE);  Surgeon: Geralynn Rile, MD;  Location: [place];  Service: Cardiovascular;  Laterality: N/A;   TEE WITHOUT CARDIOVERSION N/A 02/15/2022   Procedure: TRANSESOPHAGEAL ECHOCARDIOGRAM (TEE);  Surgeon: Sherren Mocha, MD;  Location: [place] CV LAB;  Service: Cardiovascular;  Laterality: N/A;   TONSILLECTOMY  1946    Current Medications: Current Meds  Medication Sig   acetaminophen (TYLENOL) 500 MG tablet Take 2 tablets (1,000 mg total) by mouth 2 (two) times daily as needed.   amLODipine (NORVASC) 2.5 MG tablet TAKE 1 TABLET BY MOUTH ONCE  A DAY   amoxicillin (AMOXIL) 500 MG capsule Take 4 capsules (2,000 mg total) by mouth as directed. Take 4 tablets 1 hour prior to dental work, including cleanings.   aspirin 81 MG tablet Take 1 tablet (81 mg total) by mouth daily.   benzocaine-menthol (CHLORASEPTIC) 6-10 MG lozenge Take 1 lozenge by mouth as needed for sore throat.   Carbidopa-Levodopa ER (SINEMET CR) 25-100 MG tablet controlled release TAKE 2 TABLET BY MOUTH AT 9 AM, 1 TAB AT1 PM AND 1 TAB AT 5 PM   clonazePAM (KLONOPIN) 0.5 MG tablet TAKE 1/2 A TABLET BY MOUTH AT BEDTIME   clopidogrel (PLAVIX) 75 MG tablet Take 1 tablet (75 mg total) by mouth daily with breakfast.   EPINEPHrine 0.3 mg/0.3 mL IJ SOAJ injection Inject 0.3 mg into the muscle as needed for anaphylaxis.   fexofenadine (ALLEGRA) 180 MG tablet Take 180 mg by mouth daily.   Fluticasone-Umeclidin-Vilant (TRELEGY ELLIPTA) 100-62.5-25 MCG/ACT AEPB Inhale 1 puff into the lungs daily.   furosemide (LASIX) 20 MG tablet Take 1 tablet (20 mg total) by mouth daily.   gabapentin ([name]) 300 MG capsule TAKE 2 CAPSULES BY MOUTH 3 TIMES DAILY    pantoprazole (PROTONIX) 40 MG tablet Take 1 tablet (40 mg total) by mouth daily.     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  Tobacco Use   Smoking status: Never   Smokeless tobacco: Never  Vaping Use   Vaping Use: Never used  Substance and Sexual Activity   Alcohol use: No    Alcohol/week: 0.0 standard drinks   Drug use: No   Sexual activity: Not Currently    Birth control/protection: Surgical    Comment: TAH/BSO  Other Topics Concern   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    Aunt of Dr Margaretmary Eddy    Lives independent living at [facility]    No children    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, 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, 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, 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)    Activity: walking about 1 mile/day, enjoys [employer]    Diet: some water, fruits/vegetables some    Right handed    Social Determinants of Radio broadcast assistant Strain: Low Risk    Difficulty of Paying Living Expenses: Not hard at all  Food Insecurity: No Food Insecurity   Worried About Charity fundraiser in the Last Year: Never true   Arboriculturist in the Last Year: Never true  Transportation Needs: No Transportation Needs   Lack of Transportation (Medical): No   Lack of Transportation (Non-Medical): No  Physical Activity: Inactive   Days of Exercise per Week: 0 days   Minutes of Exercise per Session: 0 min  Stress: No Stress Concern Present   Feeling of Stress : Not at all  Social Connections: Socially Isolated   Frequency  of Communication with Friends and Family: More than three times a week   Frequency of Social Gatherings with Friends and Family: Three times a week   Attends Religious Services: Never   Active Member of  Clubs or Organizations: No   Attends Archivist Meetings: Never   Marital Status: Widowed     Family History: The patient's family history includes Breast cancer in her cousin; Breast cancer (age of onset: 79) in her sister; Cancer in her maternal grandfather, maternal grandmother, maternal uncle, maternal uncle, and maternal uncle; Colon cancer in her sister; Colon cancer (age of onset: 52) in her cousin; Emphysema in her mother; Liver cancer in her maternal aunt; Lung cancer (age of onset: 62) in her cousin; Lung cancer (age of onset: 72) in her father; Melanoma in her maternal aunt; Myasthenia gravis in her paternal aunt; Osteoporosis in her paternal aunt; Parkinson's disease in her cousin; Stroke in her paternal aunt. There is no history of Diabetes, Heart disease, Stomach cancer, or Ulcerative colitis.  ROS:   Please see the history of present illness.    All other systems reviewed and are negative.  EKGs/Labs/Other Studies Reviewed:    The following studies were reviewed today:  ZIO 04/12/22:  Patch Wear Time:  13 days and 19 hours (2023-04-27T14:23:18-0400 to 2023-05-11T09:37:39-0400)   Patient had a min HR of 52 bpm, max HR of 171 bpm, and avg HR of 67 bpm.  Predominant underlying rhythm was Sinus Rhythm. 2 Ventricular Tachycardia runs occurred, the run with the fastest interval lasting 9 beats with a max rate of 167 bpm (avg 146 bpm); the run with the fastest interval was also the longest.  35 Supraventricular Tachycardia runs occurred, the run with the fastest interval lasting 6 beats with a max rate of 171 bpm, the longest lasting 19 beats with an avg rate of 109 bpm. Occasional PVCs with a burden of 1.7%.  Echocardiogram 03/19/22:   Left ventricular ejection  fraction, by estimation, is 65 to 70%. The left ventricle has normal function. The left ventricle has no regional wall motion abnormalities. There is mild left ventricular hypertrophy. Left ventricular diastolic parameters are consistent with Grade II diastolic dysfunction (pseudonormalization). Elevated left atrial pressure. 1. Right ventricular systolic function is normal. The right ventricular size is normal. There is normal pulmonary artery systolic pressure. 2. 3. Left atrial size was severely dilated. 4. PFO is present as seen by color doppler wiht L to R flow. . present. S/p MtriaClip with 2 XTW clipis (A2/P2 and A3/P3) MR is very eccentric directed posteriorly along wall of LV It is probably moderate in severity. Compared to echo from February 16, 2022, MR appears more prominent . The mitral valve has been repaired/replaced. There is moderate prolapse of both leaflets of the mitral valve. Procedure Date: 02/15/2022. 5. The aortic valve is tricuspid. Aortic valve regurgitation is not visualized. Aortic valve sclerosis is present, with no evidence of aortic valve stenosis. 6. The inferior vena cava is normal in size with greater than 50% respiratory variability, suggesting right atrial pressure of 3 mmHg.       Date of Procedure:                02/15/2022   Preoperative Diagnosis:      Severe Symptomatic Mitral Regurgitation (Stage D)   Postoperative Diagnosis:    Same    Procedure Performed: Ultrasound-guided right transfemoral venous access Double PreClose right femoral vein Transseptal puncture using Bailess RF needle Mitral valve repair with MitraClip XTW x 2   Surgeon: Blane Ohara, MD  Cosurgeon: Lenna Sciara, MD   Echocardiographer: Dr Johnsie Cancel and Dr Gasper Sells   Anesthesiologist: Rochele Pages, MD   Device Implant: Mitraclip XTW, 2  devices   Procedural Indication: Severe Non-rheumatic Mitral Regurgitation (Stage D)  ___________   EKG:  EKG is NOT  ordered today.    Recent Labs: 02/12/2022: ALT <5; B Natriuretic Peptide 317.9 03/23/2022: Hemoglobin 11.6; Platelets 131.0; TSH 0.84 04/30/2022: BUN 12; Creatinine, Ser 0.73; NT-Pro BNP 403; Potassium 4.0; Sodium 140  Recent Lipid Panel    Component Value Date/Time   CHOL 171 08/08/2021 1320   TRIG 100.0 08/08/2021 1320   HDL 53.30 08/08/2021 1320   CHOLHDL 3 08/08/2021 1320   VLDL 20.0 08/08/2021 1320   LDLCALC 97 08/08/2021 1320   LDLDIRECT 135.0 08/16/2009 1359     Risk Assessment/Calculations:       Physical Exam:    VS:  BP 118/76   Pulse 72   Ht '5\' 2"'$  (1.575 m)   Wt 123 lb 6.4 oz (56 kg)   SpO2 99%   BMI 22.57 kg/m     Wt Readings from Last 3 Encounters:  04/30/22 123 lb 6.4 oz (56 kg)  04/27/22 123 lb 6.4 oz (56 kg)  04/17/22 119 lb (54 kg)     GEN:  Well nourished, well developed in no acute distress HEENT: Normal NECK: No JVD LYMPHATICS: No lymphadenopathy CARDIAC: RRR, 4/6 holosystolic murmur at apex. No rubs, gallops RESPIRATORY:  mild crackles at bases  ABDOMEN: Soft, non-tender, non-distended MUSCULOSKELETAL:  No edema; No deformity  SKIN: Warm and dry. Mild bilateral LE edema.  NEUROLOGIC:  Alert and oriented x 3 PSYCHIATRIC:  Normal affect   ASSESSMENT:    1. Shortness of breath   2. S/P mitral valve clip implantation   3. Severe mitral regurgitation   4. Chronic obstructive airway disease with asthma ([place])   5. History of pulmonary embolism    PLAN:    In order of problems listed above:  Shortness of breath: she felt better immediately after her clip surgery but then had a very quick return of debilitating symptoms that corresponded with a worsening of MR from mild immediately post op to moderate to severe currently. She has a loud murmur at her apex. She has NYHA class III symptoms of dyspnea and fatigue. Given mild LE edema, I started her on low dose Lasix although follow up BNP was within a normal range. Has a history of PE, but I have a  low suspicion for this based on normal 02 sats, HR and RV on echo. Discussed case extensively with valve team and plan to repeat a TEE on 6/19 to see if a potential third clip could be an option.   S/p Mitraclip with return of moderate to severe MR: see above.  COPD: seen recently by pulm and felt to be stable. Continue inhalers.   Hx of PE: treated with 3 mo of Eliquis. As above,  I have a low suspicion for this based on normal 02 sats, HR and RV on echo. She tells me her symptoms now are not reminiscent of when she had a PE.   Shared Decision Making/Informed Consent The risks [esophageal damage, perforation (1:10,000 risk), bleeding, pharyngeal hematoma as well as other potential complications associated with conscious sedation including aspiration, arrhythmia, respiratory failure and death], benefits (treatment guidance and diagnostic support) and alternatives of a transesophageal echocardiogram were discussed in detail with Ms. Froio and she is willing to proceed.     Medication Adjustments/Labs and Tests Ordered: Current medicines are reviewed at length with the patient today.  Concerns regarding medicines are outlined above.  Orders Placed This Encounter  Procedures   Basic metabolic panel   Pro b natriuretic peptide (BNP)   Meds ordered this encounter  Medications   furosemide (LASIX) 20 MG tablet    Sig: Take 1 tablet (20 mg total) by mouth daily.    Dispense:  90 tablet    Refill:  3    Patient Instructions  Medication Instructions:  Your physician has recommended you make the following change in your medication:  START  LASIX 20 MG DAILY  *If you need a refill on your cardiac medications before your next appointment, please call your pharmacy*   Lab Work: TODAY: BMET, BNP If you have labs (blood work) drawn today and your tests are completely normal, you will receive your results only by: [place] (if you have MyChart) OR A paper copy in the mail If you have  any lab test that is abnormal or we need to change your treatment, we will call you to review the results.   Testing/Procedures: NONE   Follow-Up: At [facility], you and your health needs are our priority.  As part of our continuing mission to provide you with exceptional heart care, we have created designated Provider Care Teams.  These Care Teams include your primary Cardiologist (physician) and Advanced Practice Providers (APPs -  Physician Assistants and Nurse Practitioners) who all work together to provide you with the care you need, when you need it.  We recommend signing up for the patient portal called "MyChart".  Sign up information is provided on this After Visit Summary.  MyChart is used to connect with patients for Virtual Visits (Telemedicine).  Patients are able to view lab/test results, encounter notes, upcoming appointments, etc.  Non-urgent messages can be sent to your provider as well.   To learn more about what you can do with MyChart, go to [email or web address].    Your next appointment:   KEEP SCHEDULED APPOINTMENT   Important Information About Sugar         Weston Brass Angelena Form, PA-C  05/01/2022 10:45 AM    [place]

## 2022-04-24 NOTE — H&P (View-Only) (Signed)
[facility]                                     Cardiology Office Note:    Date:  05/01/2022   ID:  Crystal Bass, DOB 04-Oct-1941, MRN [id number]  PCP:  [name], MD  [facility] Cardiologist:  [name], MD /Dr. Burt Knack, MD (TEER) [place] Electrophysiologist:  None   Referring MD: [name], MD   Follow up.   History of Present Illness:    Crystal Bass is a 81 y.o. female with a hx of pulmonary embolus 2019 Rx'd with Eliquis x 3 months, Parkinson's disease complicated by orthostatic hypotension, [name] infection with chronic lung disease, remote mastectomy for breast cancer, and severe mitral regurgitation s/p mTEER 02/15/22 who presents to clinic for follow up.    Crystal Bass lives independently at [facility] retirement facility in [place]. She was very functional at baseline; however, was referred to Dr. Burt Knack after experiencing worsening fatigue and exertional dyspnea x 6 months and diagnosed with severe mitral regurgitation by TTE and cath. [facility] 11/14/21 showed no CAD and severe mitral regurgitation with pulmonary vein flow reversal and large V waves.  Subsequent TEE 12/27/21 showed severe mitral regurgitation with marked prolapse of the A2 scallop of the anterior mitral leaflet and severe associated mitral regurgitation (Barlow valve).  She did not have any clear evidence of a flail leaflet.    The patient was evaluated by the multidisciplinary valve team and underwent successful mTEER using 2 Mitraclip XTW devices, reducing MR from 4+ to 1+, both Clips positioned A2/P2. Intraoperative echo showed mild residual grade 1 MR at end of case with good tissue bridge on 3D imaging Mean gradient 5 peak 8 mmHg across MV at HR of 64 bpm. POD1 echo showed EF 60% with a slight increase in MR to mild to moderate. Plan was for ASA and Plavix for at least 3 months (05/18/22) then monotherapy with aspirin alone.     She was seen in [facility] follow up after TEER with significant dyspnea on exertion with concern for possible clip detachment. Her murmur was much more pronounced although with no other overt HF symptoms. Dr. Burt Knack saw her and agreed with the need for an urgent echocardiogram, performed 02/27/22. This showed stable clip placement with mild to moderate MR. In follow up, she continued to have SOB with minimal exertion although was walking 1/2 mile, twice daily for exercise. She did have complaints of brief palpitations at times and was set up to wear a ZIO. This showed two short episodes of VT and occasional PVCs but otherwise was stable. She has been working with cardiopulmonary rehab. She was recently seen by pulm and felt to be stable from their standpoint. 1 month echocardiogram s/p mTEER showed EF 65-70%, s/p MtriaClip with 2 XTW clips (A2/P2 and A3/P3) with moderate eccentric MR directed posteriorly along wall of LV.   Today she presents for follow up. She is here alone. She wants to know "next steps." She feels terrible. She felt better immediately after her clip surgery but then had a return of debilitating symptoms. She cannot walk across the room without shortness of breath or extreme fatigue. She is working with cardiac rehab with minimal improvement. Has has some mild LE edema. No orthopnea or PND. No dizziness or syncope. No chest pain.   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   Anginal pain ([place])    Aortic root aneurysm ([facility])    Arthritis    Asthma    Breast cancer ([place]) 2002   infiltrative ductal carcinoma    Cataract 2019   COPD (chronic obstructive pulmonary disease) ([place]) 07/2007   Ductal carcinoma of breast, estrogen receptor positive, stage 1 ([place]) 09/16/2012   Dyspnea    Endometriosis    GERD (gastroesophageal reflux disease)    Hypercalcemia 09/14/2013   Hypertension    Lesion of breast 1992   right, benign   Lung  disease    secondary to [name] infection   Mastalgia 05/1994   Osteoporosis, post-menopausal 03/14/2012   Ovarian tumor of borderline malignancy, right 2004   Pancreatitis    secondary to Cholelithiasis   Parkinson disease ([place]) 02/10/2015   Post-thoracotomy pain syndrome    Pseudomonas pneumonia ([place]) 03/2008   Status post implantation of mitral valve leaflet clip 02/15/2022   s/p XTW MitraClip x2 by Dr. Burt Knack in the A2/P2 position   Traumatic closed fracture of distal clavicle with minimal displacement, left, initial encounter 06/2021   [facility]   Uterine fibroid    Vitamin D deficiency     Past Surgical History:  Procedure Laterality Date   ABDOMINAL HYSTERECTOMY     & BSO for Mucinous borderline tumor of R ovary 2004   APPENDECTOMY  2004   BREAST BIOPSY  08/2004   right breast-benign   BREAST LUMPECTOMY  1992   benign   BUBBLE STUDY  12/27/2021   Procedure: BUBBLE STUDY;  Surgeon: Geralynn Rile, MD;  Location: [facility] ENDOSCOPY;  Service: Cardiovascular;;   CARDIAC CATHETERIZATION  2007   essentially negation for significant CAD   CHOLECYSTECTOMY  2001   COLONOSCOPY  2014   Dr [name] , due 2019   COLONOSCOPY  09/2018   2 TA removed, int hem, no f/u needed (Dr [name])    ESOPHAGOGASTRODUODENOSCOPY  09/2018   GERD with esophagitis and stricture dilated, antral gastritis negative for H pylori [name])   LUNG BIOPSY  2002   [name], Dr [name]   MASTECTOMY MODIFIED RADICAL Left 2002   oral chemotheraphy (tamoxifen then Armidex) no radiation, Dr.[name]   MITRAL VALVE REPAIR N/A 02/15/2022   Procedure: MITRAL VALVE REPAIR;  Surgeon: Sherren Mocha, MD;  Location: [place] [facility];  Service: Cardiovascular;  Laterality: N/A;   RIGHT/LEFT HEART CATH AND CORONARY ANGIOGRAPHY Bilateral 11/14/2021   Procedure: RIGHT/LEFT HEART CATH AND CORONARY ANGIOGRAPHY;  Surgeon: Wellington Hampshire, MD;  Location: [place] [facility];  Service: Cardiovascular;  Laterality: Bilateral;    TEE WITHOUT CARDIOVERSION N/A 12/29/2018   Procedure: TRANSESOPHAGEAL ECHOCARDIOGRAM (TEE);  Surgeon: Wellington Hampshire, MD;  Location: [facility] ORS;  Service: Cardiovascular;  Laterality: N/A;   TEE WITHOUT CARDIOVERSION N/A 12/27/2021   Procedure: TRANSESOPHAGEAL ECHOCARDIOGRAM (TEE);  Surgeon: Geralynn Rile, MD;  Location: [place];  Service: Cardiovascular;  Laterality: N/A;   TEE WITHOUT CARDIOVERSION N/A 02/15/2022   Procedure: TRANSESOPHAGEAL ECHOCARDIOGRAM (TEE);  Surgeon: Sherren Mocha, MD;  Location: [place] [facility];  Service: Cardiovascular;  Laterality: N/A;   TONSILLECTOMY  1946    Current Medications: Current Meds  Medication Sig   acetaminophen (TYLENOL) 500 MG tablet Take 2 tablets (1,000 mg total) by mouth 2 (two) times daily as needed.   amLODipine (NORVASC) 2.5 MG tablet TAKE 1 TABLET BY MOUTH ONCE  A DAY   amoxicillin (AMOXIL) 500 MG capsule Take 4 capsules (2,000 mg total) by mouth as directed. Take 4 tablets 1 hour prior to dental work, including cleanings.   aspirin 81 MG tablet Take 1 tablet (81 mg total) by mouth daily.   benzocaine-menthol (CHLORASEPTIC) 6-10 MG lozenge Take 1 lozenge by mouth as needed for sore throat.   Carbidopa-Levodopa ER (SINEMET CR) 25-100 MG tablet controlled release TAKE 2 TABLET BY MOUTH AT 9 AM, 1 TAB AT1 PM AND 1 TAB AT 5 PM   clonazePAM (KLONOPIN) 0.5 MG tablet TAKE 1/2 A TABLET BY MOUTH AT BEDTIME   clopidogrel (PLAVIX) 75 MG tablet Take 1 tablet (75 mg total) by mouth daily with breakfast.   EPINEPHrine 0.3 mg/0.3 mL IJ SOAJ injection Inject 0.3 mg into the muscle as needed for anaphylaxis.   fexofenadine (ALLEGRA) 180 MG tablet Take 180 mg by mouth daily.   Fluticasone-Umeclidin-Vilant (TRELEGY ELLIPTA) 100-62.5-25 MCG/ACT AEPB Inhale 1 puff into the lungs daily.   furosemide (LASIX) 20 MG tablet Take 1 tablet (20 mg total) by mouth daily.   gabapentin ([name]) 300 MG capsule TAKE 2 CAPSULES BY MOUTH 3 TIMES DAILY    pantoprazole (PROTONIX) 40 MG tablet Take 1 tablet (40 mg total) by mouth daily.     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  Tobacco Use   Smoking status: Never   Smokeless tobacco: Never  Vaping Use   Vaping Use: Never used  Substance and Sexual Activity   Alcohol use: No    Alcohol/week: 0.0 standard drinks   Drug use: No   Sexual activity: Not Currently    Birth control/protection: Surgical    Comment: TAH/BSO  Other Topics Concern   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    Aunt of Dr Margaretmary Eddy    Lives independent living at [facility]    No children    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, 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, 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, 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)    Activity: walking about 1 mile/day, enjoys [employer]    Diet: some water, fruits/vegetables some    Right handed    Social Determinants of Radio broadcast assistant Strain: Low Risk    Difficulty of Paying Living Expenses: Not hard at all  Food Insecurity: No Food Insecurity   Worried About Charity fundraiser in the Last Year: Never true   Arboriculturist in the Last Year: Never true  Transportation Needs: No Transportation Needs   Lack of Transportation (Medical): No   Lack of Transportation (Non-Medical): No  Physical Activity: Inactive   Days of Exercise per Week: 0 days   Minutes of Exercise per Session: 0 min  Stress: No Stress Concern Present   Feeling of Stress : Not at all  Social Connections: Socially Isolated   Frequency  of Communication with Friends and Family: More than three times a week   Frequency of Social Gatherings with Friends and Family: Three times a week   Attends Religious Services: Never   Active Member of  Clubs or Organizations: No   Attends Archivist Meetings: Never   Marital Status: Widowed     Family History: The patient's family history includes Breast cancer in her cousin; Breast cancer (age of onset: 72) in her sister; Cancer in her maternal grandfather, maternal grandmother, maternal uncle, maternal uncle, and maternal uncle; Colon cancer in her sister; Colon cancer (age of onset: 67) in her cousin; Emphysema in her mother; Liver cancer in her maternal aunt; Lung cancer (age of onset: 11) in her cousin; Lung cancer (age of onset: 62) in her father; Melanoma in her maternal aunt; Myasthenia gravis in her paternal aunt; Osteoporosis in her paternal aunt; Parkinson's disease in her cousin; Stroke in her paternal aunt. There is no history of Diabetes, Heart disease, Stomach cancer, or Ulcerative colitis.  ROS:   Please see the history of present illness.    All other systems reviewed and are negative.  EKGs/Labs/Other Studies Reviewed:    The following studies were reviewed today:  ZIO 04/12/22:  Patch Wear Time:  13 days and 19 hours (2023-04-27T14:23:18-0400 to 2023-05-11T09:37:39-0400)   Patient had a min HR of 52 bpm, max HR of 171 bpm, and avg HR of 67 bpm.  Predominant underlying rhythm was Sinus Rhythm. 2 Ventricular Tachycardia runs occurred, the run with the fastest interval lasting 9 beats with a max rate of 167 bpm (avg 146 bpm); the run with the fastest interval was also the longest.  35 Supraventricular Tachycardia runs occurred, the run with the fastest interval lasting 6 beats with a max rate of 171 bpm, the longest lasting 19 beats with an avg rate of 109 bpm. Occasional PVCs with a burden of 1.7%.  Echocardiogram 03/19/22:   Left ventricular ejection  fraction, by estimation, is 65 to 70%. The left ventricle has normal function. The left ventricle has no regional wall motion abnormalities. There is mild left ventricular hypertrophy. Left ventricular diastolic parameters are consistent with Grade II diastolic dysfunction (pseudonormalization). Elevated left atrial pressure. 1. Right ventricular systolic function is normal. The right ventricular size is normal. There is normal pulmonary artery systolic pressure. 2. 3. Left atrial size was severely dilated. 4. PFO is present as seen by color doppler wiht L to R flow. . present. S/p MtriaClip with 2 XTW clipis (A2/P2 and A3/P3) MR is very eccentric directed posteriorly along wall of LV It is probably moderate in severity. Compared to echo from February 16, 2022, MR appears more prominent . The mitral valve has been repaired/replaced. There is moderate prolapse of both leaflets of the mitral valve. Procedure Date: 02/15/2022. 5. The aortic valve is tricuspid. Aortic valve regurgitation is not visualized. Aortic valve sclerosis is present, with no evidence of aortic valve stenosis. 6. The inferior vena cava is normal in size with greater than 50% respiratory variability, suggesting right atrial pressure of 3 mmHg.       Date of Procedure:                02/15/2022   Preoperative Diagnosis:      Severe Symptomatic Mitral Regurgitation (Stage D)   Postoperative Diagnosis:    Same    Procedure Performed: Ultrasound-guided right transfemoral venous access Double PreClose right femoral vein Transseptal puncture using Bailess RF needle Mitral valve repair with MitraClip XTW x 2   Surgeon: Blane Ohara, MD  Cosurgeon: Lenna Sciara, MD   Echocardiographer: Dr Johnsie Cancel and Dr Gasper Sells   Anesthesiologist: Rochele Pages, MD   Device Implant: Mitraclip XTW, 2  devices   Procedural Indication: Severe Non-rheumatic Mitral Regurgitation (Stage D)  ___________   EKG:  EKG is NOT  ordered today.    Recent Labs: 02/12/2022: ALT <5; B Natriuretic Peptide 317.9 03/23/2022: Hemoglobin 11.6; Platelets 131.0; TSH 0.84 04/30/2022: BUN 12; Creatinine, Ser 0.73; NT-Pro BNP 403; Potassium 4.0; Sodium 140  Recent Lipid Panel    Component Value Date/Time   CHOL 171 08/08/2021 1320   TRIG 100.0 08/08/2021 1320   HDL 53.30 08/08/2021 1320   CHOLHDL 3 08/08/2021 1320   VLDL 20.0 08/08/2021 1320   LDLCALC 97 08/08/2021 1320   LDLDIRECT 135.0 08/16/2009 1359     Risk Assessment/Calculations:       Physical Exam:    VS:  BP 118/76   Pulse 72   Ht '5\' 2"'$  (1.575 m)   Wt 123 lb 6.4 oz (56 kg)   SpO2 99%   BMI 22.57 kg/m     Wt Readings from Last 3 Encounters:  04/30/22 123 lb 6.4 oz (56 kg)  04/27/22 123 lb 6.4 oz (56 kg)  04/17/22 119 lb (54 kg)     GEN:  Well nourished, well developed in no acute distress HEENT: Normal NECK: No JVD LYMPHATICS: No lymphadenopathy CARDIAC: RRR, 4/6 holosystolic murmur at apex. No rubs, gallops RESPIRATORY:  mild crackles at bases  ABDOMEN: Soft, non-tender, non-distended MUSCULOSKELETAL:  No edema; No deformity  SKIN: Warm and dry. Mild bilateral LE edema.  NEUROLOGIC:  Alert and oriented x 3 PSYCHIATRIC:  Normal affect   ASSESSMENT:    1. Shortness of breath   2. S/P mitral valve clip implantation   3. Severe mitral regurgitation   4. Chronic obstructive airway disease with asthma ([place])   5. History of pulmonary embolism    PLAN:    In order of problems listed above:  Shortness of breath: she felt better immediately after her clip surgery but then had a very quick return of debilitating symptoms that corresponded with a worsening of MR from mild immediately post op to moderate to severe currently. She has a loud murmur at her apex. She has NYHA class III symptoms of dyspnea and fatigue. Given mild LE edema, I started her on low dose Lasix although follow up BNP was within a normal range. Has a history of PE, but I have a  low suspicion for this based on normal 02 sats, HR and RV on echo. Discussed case extensively with valve team and plan to repeat a TEE on 6/19 to see if a potential third clip could be an option.   S/p Mitraclip with return of moderate to severe MR: see above.  COPD: seen recently by pulm and felt to be stable. Continue inhalers.   Hx of PE: treated with 3 mo of Eliquis. As above,  I have a low suspicion for this based on normal 02 sats, HR and RV on echo. She tells me her symptoms now are not reminiscent of when she had a PE.   Shared Decision Making/Informed Consent The risks [esophageal damage, perforation (1:10,000 risk), bleeding, pharyngeal hematoma as well as other potential complications associated with conscious sedation including aspiration, arrhythmia, respiratory failure and death], benefits (treatment guidance and diagnostic support) and alternatives of a transesophageal echocardiogram were discussed in detail with Ms. Scales and she is willing to proceed.     Medication Adjustments/Labs and Tests Ordered: Current medicines are reviewed at length with the patient today.  Concerns regarding medicines are outlined above.  Orders Placed This Encounter  Procedures   Basic metabolic panel   Pro b natriuretic peptide (BNP)   Meds ordered this encounter  Medications   furosemide (LASIX) 20 MG tablet    Sig: Take 1 tablet (20 mg total) by mouth daily.    Dispense:  90 tablet    Refill:  3    Patient Instructions  Medication Instructions:  Your physician has recommended you make the following change in your medication:  START  LASIX 20 MG DAILY  *If you need a refill on your cardiac medications before your next appointment, please call your pharmacy*   Lab Work: TODAY: BMET, BNP If you have labs (blood work) drawn today and your tests are completely normal, you will receive your results only by: [place] (if you have MyChart) OR A paper copy in the mail If you have  any lab test that is abnormal or we need to change your treatment, we will call you to review the results.   Testing/Procedures: NONE   Follow-Up: At [facility], you and your health needs are our priority.  As part of our continuing mission to provide you with exceptional heart care, we have created designated Provider Care Teams.  These Care Teams include your primary Cardiologist (physician) and Advanced Practice Providers (APPs -  Physician Assistants and Nurse Practitioners) who all work together to provide you with the care you need, when you need it.  We recommend signing up for the patient portal called "MyChart".  Sign up information is provided on this After Visit Summary.  MyChart is used to connect with patients for Virtual Visits (Telemedicine).  Patients are able to view lab/test results, encounter notes, upcoming appointments, etc.  Non-urgent messages can be sent to your provider as well.   To learn more about what you can do with MyChart, go to [email or web address].    Your next appointment:   KEEP SCHEDULED APPOINTMENT   Important Information About Sugar         Weston Brass Angelena Form, PA-C  05/01/2022 10:45 AM    [place]

## 2022-04-25 ENCOUNTER — Encounter: Payer: Medicare PPO | Admitting: *Deleted

## 2022-04-25 DIAGNOSIS — Z48812 Encounter for surgical aftercare following surgery on the circulatory system: Secondary | ICD-10-CM | POA: Diagnosis not present

## 2022-04-25 DIAGNOSIS — Z9889 Other specified postprocedural states: Secondary | ICD-10-CM

## 2022-04-25 DIAGNOSIS — Z954 Presence of other heart-valve replacement: Secondary | ICD-10-CM | POA: Diagnosis not present

## 2022-04-25 NOTE — Progress Notes (Signed)
Daily Session Note  Patient Details  Name: Crystal Bass MRN: [id number] Date of Birth: 05-Jan-1941 Referring Provider:   Flowsheet Row Cardiac Rehab from 03/21/2022 in [facility]  Referring Provider [name] MD       Encounter Date: 04/25/2022  Check In:  Session Check In - 04/25/22 1346       Check-In   Supervising physician immediately available to respond to emergencies See telemetry face sheet for immediately available ER MD    Location [facility]-Cardiac & Pulmonary Re[facility]    Staff Present Nyoka Cowden, RN, BSN, Lauretta Grill, RCP,RRT,BSRT;Kara Perla, MS, ASCM CEP, Exercise Physiologist    Virtual Visit No    Medication changes reported     No    Fall or balance concerns reported    No    Tobacco Cessation No Change    Warm-up and Cool-down Performed on first and last piece of equipment    Resistance Training Performed Yes    VAD Patient? No    PAD/SET Patient? No      Pain Assessment   Currently in Pain? No/denies                Social History   Tobacco Use  Smoking Status Never  Smokeless Tobacco Never    Goals Met:  Independence with exercise equipment Exercise tolerated well No report of concerns or symptoms today  Goals Unmet:  Not Applicable  Comments: Pt able to follow exercise prescription today without complaint.  Will continue to monitor for progression.    Dr. Emily Filbert is Medical Director for [facility].  Dr. Ottie Glazier is Medical Director for [facility].

## 2022-04-27 ENCOUNTER — Encounter: Payer: Self-pay | Admitting: Nurse Practitioner

## 2022-04-27 ENCOUNTER — Ambulatory Visit: Payer: Medicare PPO | Admitting: Nurse Practitioner

## 2022-04-27 VITALS — BP 110/62 | HR 69 | Temp 98.2°F | Ht 62.0 in | Wt 123.4 lb

## 2022-04-27 DIAGNOSIS — J4530 Mild persistent asthma, uncomplicated: Secondary | ICD-10-CM | POA: Diagnosis not present

## 2022-04-27 DIAGNOSIS — J432 Centrilobular emphysema: Secondary | ICD-10-CM | POA: Diagnosis not present

## 2022-04-27 DIAGNOSIS — I059 Rheumatic mitral valve disease, unspecified: Secondary | ICD-10-CM

## 2022-04-27 DIAGNOSIS — J449 Chronic obstructive pulmonary disease, unspecified: Secondary | ICD-10-CM | POA: Diagnosis not present

## 2022-04-27 DIAGNOSIS — I349 Nonrheumatic mitral valve disorder, unspecified: Secondary | ICD-10-CM

## 2022-04-27 MED ORDER — TRELEGY ELLIPTA 100-62.5-25 MCG/ACT IN AEPB: 1.0000 | INHALATION_SPRAY | Freq: Every day | RESPIRATORY_TRACT | 6 refills | Status: DC

## 2022-04-27 NOTE — Assessment & Plan Note (Signed)
Status post mitral valve clip for severe MR.  Originally felt well after procedure; however began having similar shortness of breath symptoms.  Was seen by cardiology end of April with urgent echo performed.  Did show worsening in her mitral valve regurgitation when compared to postop echo.  She also has a severely dilated LA, G2 DD and PFO. Follow up with cardiology as scheduled

## 2022-04-27 NOTE — Patient Instructions (Addendum)
Restart Trelegy 1 puff daily. Brush tongue and rinse mouth afterwards  Continue Albuterol inhaler 2 puffs or 3 mL neb every 6 hours as needed for shortness of breath or wheezing. Notify if symptoms persist despite rescue inhaler/neb use. Continue allegra 180 mg daily for allergies Continue protonix 40 mg daily    Follow up with cardiology as scheduled.   Follow up in 3 months with Dr. Chase Caller or Alanson Aly. If symptoms do not improve or worsen, please contact office for sooner follow up or seek emergency care.

## 2022-04-27 NOTE — Progress Notes (Signed)
$'@Patient'I$  ID: Crystal Bass, female    DOB: January 21, 1941, 81 y.o.   MRN: [id number]  Chief Complaint  Patient presents with   Follow-up    Pt is here for follow up for her pneumonia. Pt states that she is doing about the same since last visit. Pt states that she believes that the Judithann Sauger is not working as well as her previous inhaler. No oxygen during the day or night. Pt states she does get winded still when she is up and walking around.     Referring provider: [name], MD  HPI: 81 year old female, never smoker followed for asthma and history of [name] infection.  She is patient Dr. Golden Pop last seen in office 03/30/2022.  Past medical history significant for allergic rhinitis, hypertension, aortic aneurysm, PE, breast cancer, dysphagia, Parkinson's disease, severe mitral valve regurgitation status post clip.  TEST/EVENTS:  01/13/2019 PFTs: FVC 75, FEV1 77, ratio 77, TLC 102, DLCOcor 95. Borderline BD with significant midflow reversibility.  02/03/2022 CT chest with contrast: Enlarged heart size.  No central or proximal segmental pulmonary embolus noted.  No LAD present.  There is a tiny hiatal hernia.  There are biapical mild centrilobular emphysematous changes.  Grossly similar appearing right upper lobe.  Mediastinum and apical postoperative changes with architectural distortion and volume loss.  Similar appearing lingular atelectasis.  No new focal consolidation.  There is a slightly more conspicuous right lower and middle lobe tree-in-bud nodularity.  Interval development of left lower lobe tree-in-bud nodularities.  Findings suggestive of atypical infection.  Stable 1.1 x 0.6 cm right lower lobe centrally calcified pulmonary nodule. 03/19/2022 echocardiogram: 1 month after mitral valve clip.  EF 65 to 70%.  G2 DD.  LA severely dilated. Normal PASP.  PFO is present as seen by color Doppler with left-to-right flow.  MR is very eccentric; regurgitation moderate in severity; appears more  prominent than previous echo after procedure. 03/30/2022 CXR 2 view: stable coarsened opacities in the RUL with volume loss, architectural distortion, and BTX overall unchanged when compared to prior. Mild coarsened interstitial markings   02/09/2022: OV with Volanda Napoleon NP for surgical risk assessment.  She was seen in the ED on 02/03/2022 for chest pain and diagnosed with atypical pneumonia.  Had a CXR that showed right upper lobe airspace opacity and possible small apical thorax.  CT chest was negative for pneumothorax but did show diffuse tree-in-bud opacities concerning for pneumonia.  Will be finishing Augmentin tomorrow.  Doing well on Trelegy.  No signs of COPD exacerbation.  Advised that she would be high risk for surgery given age, COPD/asthma, [name] and recent pneumonia.  Plans for repeat CXR today which did not show any new opacities; persistent right upper lobe opacity in keeping with known history.  03/30/2022: OV with Eleri Ruben NP for follow-up.  She is somewhat unsure why she is here.  She was recommended by her cardiologist to come back as she has had progressive shortness of breath over the last 6 months.  She was found to have severe mitral valve regurgitation and underwent mitral valve clip in March.  Initially felt better and then began to have shortness of breath symptoms again.  Went back to cardiology and had a echocardiogram which showed moderate mitral valve regurgitation, worsened since echo after procedure, PFO and G2 DD.  She also reports that she has had some increased swelling in both of her lower legs.  She anticipated to see her primary cardiologist on Tuesday.  She denies any wheezing,  cough, hemoptysis, recent weight loss, anorexia, night sweats, recent fevers.  Strong suspicion her symptoms are related to her worsening MR given she had the same symptoms prior to her MR clip. Recommended we could try switching from Trelegy to [facility] to see if she had any benefit from this. Walking oximetry was  without desaturations; pt did not want to repeat PFTs. CXR was stable.   04/27/2022: Today - follow up Patient presents today for follow up. Overall, she feels relatively the same as when I saw her last. No perceived benefit from [place]; felt like Trelegy actually worked a little bit better. She continues to have DOE and lower extremity swelling. She was seen by Dr. Fletcher Anon after I saw her on 5/9 who recommended she continue cardiac rehab; thought she may benefit from diuretics but she was not started on anything new at this visit. She has follow up scheduled with the structural heart clinic soon. She denies any cough, wheezing, hemoptysis, palpitations, fevers.   Allergies  Allergen Reactions   Sulfa Antibiotics Anaphylaxis   Sulfonamide Derivatives Anaphylaxis   Topamax [Topiramate] Other (See Comments)    Metabolic acidosis    Biaxin [Clarithromycin] Other (See Comments)    pericarditis   Hydrocodone Other (See Comments)    "hyper and climbing the walls"   Motrin [Ibuprofen] Other (See Comments)    headaches   Symbicort [Budesonide-Formoterol Fumarate] Other (See Comments)    02/07/15 tremor   Percocet [Oxycodone-Acetaminophen] Itching and Rash   Tape Itching and Rash    Use paper tape only   Tetracyclines & Related Other (See Comments)    "immediate yeast infection"    Immunization History  Administered Date(s) Administered   Fluad Quad(high Dose 65+) 08/11/2019, 08/03/2020, 08/08/2021   Influenza Split 09/11/2011   Influenza Whole 09/09/2007, 08/08/2008, 08/15/2010, 08/26/2012   Influenza,inj,Quad PF,6+ Mos 08/18/2013, 08/20/2014, 08/25/2018   Influenza-Unspecified 09/27/2015, 08/26/2017   Moderna Sars-Covid-2 Vaccination 01/01/2020, 01/20/2020, 02/16/2020, 07/25/2020, 11/22/2020   Pneumococcal Conjugate-13 02/12/2014   Pneumococcal Polysaccharide-23 11/26/2006, 10/21/2012, 11/22/2020   Td 01/01/2011, 06/30/2021   Zoster Recombinat (Shingrix) 02/22/2018, 07/08/2019   Zoster,  Live 05/26/2007    Past Medical History:  Diagnosis Date   Aneurysm of aorta ([place])    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   Anginal pain ([place])    Aortic root aneurysm ([place])    Arthritis    Asthma    Breast cancer ([place]) 2002   infiltrative ductal carcinoma    Cataract 2019   COPD (chronic obstructive pulmonary disease) ([place]) 07/2007   Ductal carcinoma of breast, estrogen receptor positive, stage 1 ([place]) 09/16/2012   Dyspnea    Endometriosis    GERD (gastroesophageal reflux disease)    Hypercalcemia 09/14/2013   Hypertension    Lesion of breast 1992   right, benign   Lung disease    secondary to [name] infection   Mastalgia 05/1994   Osteoporosis, post-menopausal 03/14/2012   Ovarian tumor of borderline malignancy, right 2004   Pancreatitis    secondary to Cholelithiasis   Parkinson disease ([place]) 02/10/2015   Post-thoracotomy pain syndrome    Pseudomonas pneumonia ([place]) 03/2008   Status post implantation of mitral valve leaflet clip 02/15/2022   s/p XTW MitraClip x2 by Dr. Burt Knack in the A2/P2 position   Traumatic closed fracture of distal clavicle with minimal displacement, left, initial encounter 06/2021   [facility]   Uterine fibroid    Vitamin D deficiency     Tobacco History: Social History  Tobacco Use  Smoking Status Never  Smokeless Tobacco Never   Counseling given: Not Answered   Outpatient Medications Prior to Visit  Medication Sig Dispense Refill   acetaminophen (TYLENOL) 500 MG tablet Take 2 tablets (1,000 mg total) by mouth 2 (two) times daily as needed.     albuterol (VENTOLIN HFA) 108 (90 Base) MCG/ACT inhaler Inhale 2 puffs into the lungs every 6 (six) hours as needed for wheezing or shortness of breath. 18 g 1   amLODipine (NORVASC) 2.5 MG tablet TAKE 1 TABLET BY MOUTH ONCE A DAY 30 tablet 5   aspirin 81 MG tablet Take 1 tablet (81 mg total) by mouth daily. 30 tablet    benzocaine-menthol (CHLORASEPTIC) 6-10 MG lozenge Take 1 lozenge by mouth as  needed for sore throat. 100 tablet 0   Carbidopa-Levodopa ER (SINEMET CR) 25-100 MG tablet controlled release TAKE 2 TABLET BY MOUTH AT 9 AM, 1 TAB AT1 PM AND 1 TAB AT 5 PM 360 tablet 1   clonazePAM (KLONOPIN) 0.5 MG tablet TAKE 1/2 A TABLET BY MOUTH AT BEDTIME 45 tablet 1   clopidogrel (PLAVIX) 75 MG tablet Take 1 tablet (75 mg total) by mouth daily with breakfast. 90 tablet 0   EPINEPHrine 0.3 mg/0.3 mL IJ SOAJ injection Inject 0.3 mg into the muscle as needed for anaphylaxis.     fexofenadine (ALLEGRA) 180 MG tablet Take 180 mg by mouth daily.     gabapentin ([name]) 300 MG capsule TAKE 2 CAPSULES BY MOUTH 3 TIMES DAILY 540 capsule 1   pantoprazole (PROTONIX) 40 MG tablet Take 1 tablet (40 mg total) by mouth daily. 90 tablet 2   Budeson-Glycopyrrol-Formoterol (BREZTRI AEROSPHERE) 160-9-4.8 MCG/ACT AERO Inhale 2 puffs into the lungs in the morning and at bedtime. 5.9 g 0   Fluticasone-Umeclidin-Vilant (TRELEGY ELLIPTA) 100-62.5-25 MCG/ACT AEPB Inhale 1 puff into the lungs daily. 60 each 5   amoxicillin (AMOXIL) 500 MG capsule Take 4 capsules (2,000 mg total) by mouth as directed. Take 4 tablets 1 hour prior to dental work, including cleanings. (Patient not taking: Reported on 04/27/2022) 12 capsule 12   No facility-administered medications prior to visit.     Review of Systems:   Constitutional: No weight loss or gain, night sweats, fevers, chills. +fatigue (chronic) HEENT: No headaches, difficulty swallowing, tooth/dental problems, or sore throat. No sneezing, itching, ear ache, nasal congestion, or post nasal drip CV:  +swelling in lower extremities (unchanged). No chest pain, orthopnea, PND, anasarca, dizziness, palpitations, syncope Resp: +shortness of breath with exertion (progressive over the last 6 months; feels about the same as she did before her valve repair surgery). No excess mucus or change in color of mucus. No productive or non-productive. No hemoptysis. No wheezing.  No chest  wall deformity GI:  No heartburn, indigestion, abdominal pain, nausea, vomiting, diarrhea, change in bowel habits, loss of appetite, bloody stools.  Skin: No rash, lesions, ulcerations MSK:  No joint pain or swelling.  No decreased range of motion.  No back pain. Neuro: No dizziness or lightheadedness.  Psych: No depression or anxiety. Mood stable.     Physical Exam:  BP 110/62 (BP Location: Right Arm, Patient Position: Sitting, Cuff Size: Normal)   Pulse 69   Temp 98.2 F (36.8 C) (Oral)   Ht '5\' 2"'$  (1.575 m)   Wt 123 lb 6.4 oz (56 kg)   SpO2 96%   BMI 22.57 kg/m   GEN: Pleasant, interactive, well-appearing; elderly; in no acute distress. HEENT:  Normocephalic and  atraumatic. PERRLA. Sclera white. Nasal turbinates pink, moist and patent bilaterally. No rhinorrhea present. Oropharynx pink and moist, without exudate or edema. No lesions, ulcerations, or postnasal drip.  NECK:  Supple w/ fair ROM. No JVD present. Normal carotid impulses w/o bruits. Thyroid symmetrical with no goiter or nodules palpated. No lymphadenopathy.   CV: RRR, systolic murmur, nonpitting dependent edema BLE. Pulses intact, +2 bilaterally. No cyanosis, pallor or clubbing. PULMONARY:  Unlabored, regular breathing. Clear bilaterally A&P w/o wheezes/rales/rhonchi. No accessory muscle use. No dullness to percussion. GI: BS present and normoactive. Soft, non-tender to palpation. No organomegaly or masses detected. No CVA tenderness. MSK: No erythema, warmth or tenderness. Cap refil <2 sec all extrem. No deformities or joint swelling noted.  Neuro: A/Ox3. No focal deficits noted.   Skin: Warm, no lesions or rashe Psych: Normal affect and behavior. Judgement and thought content appropriate.     Lab Results:  CBC    Component Value Date/Time   WBC 4.4 03/23/2022 1221   RBC 3.79 (L) 03/23/2022 1221   HGB 11.6 (L) 03/23/2022 1221   HGB 12.5 12/19/2021 1246   HGB 13.7 03/28/2017 1130   HCT 35.6 (L) 03/23/2022 1221    HCT 38.3 12/19/2021 1246   HCT 41.4 03/28/2017 1130   PLT 131.0 (L) 03/23/2022 1221   PLT 155 12/19/2021 1246   MCV 93.9 03/23/2022 1221   MCV 93 12/19/2021 1246   MCV 95.1 03/28/2017 1130   MCH 30.4 02/16/2022 0030   MCHC 32.6 03/23/2022 1221   RDW 14.6 03/23/2022 1221   RDW 12.7 12/19/2021 1246   RDW 14.2 03/28/2017 1130   LYMPHSABS 0.9 03/23/2022 1221   LYMPHSABS 0.9 11/07/2021 1432   LYMPHSABS 1.3 03/28/2017 1130   MONOABS 0.4 03/23/2022 1221   MONOABS 0.7 03/28/2017 1130   EOSABS 0.2 03/23/2022 1221   EOSABS 0.2 11/07/2021 1432   BASOSABS 0.0 03/23/2022 1221   BASOSABS 0.1 11/07/2021 1432   BASOSABS 0.1 03/28/2017 1130    BMET    Component Value Date/Time   NA 140 03/23/2022 1221   NA 140 12/19/2021 1246   NA 141 03/28/2017 1130   K 3.5 03/23/2022 1221   K 4.2 03/28/2017 1130   CL 104 03/23/2022 1221   CL 102 03/10/2013 1529   CO2 29 03/23/2022 1221   CO2 28 03/28/2017 1130   GLUCOSE 125 (H) 03/23/2022 1221   GLUCOSE 75 03/28/2017 1130   GLUCOSE 98 03/10/2013 1529   BUN 19 03/23/2022 1221   BUN 15 12/19/2021 1246   BUN 17.3 03/28/2017 1130   CREATININE 0.85 03/23/2022 1221   CREATININE 0.8 03/28/2017 1130   CALCIUM 9.2 03/23/2022 1221   CALCIUM 9.6 03/28/2017 1130   GFRNONAA 56 (L) 02/16/2022 0030   GFRAA 66 12/23/2018 1514    BNP    Component Value Date/Time   BNP 317.9 (H) 02/12/2022 1109     Imaging:  DG Chest 2 View  Result Date: 03/30/2022 CLINICAL DATA:  Follow-up atypical pneumonia, dyspnea on exertion EXAM: CHEST - 2 VIEW COMPARISON:  Chest radiograph 02/12/2022 FINDINGS: The heart is enlarged, unchanged. The upper mediastinal contours are stable. There is a new mitral clip projecting over the left ventricle. A cardiac device projects over the left chest wall. There are coarsened opacities in the right upper lobe with volume loss, architectural distortion, and bronchiectasis, overall similar to the prior study from 02/12/2022. Mildly  coarsened interstitial markings throughout the remainder of the lungs also not significantly changed. There is no new  or worsening focal airspace disease. There is no pleural effusion. There is no pneumothorax. There is no acute osseous abnormality. IMPRESSION: Overall stable appearance of the chest with coarsened opacities and architectural distortion in the right upper lobe compared to the study from 02/12/2022. No new or worsening focal airspace disease. Electronically Signed   By: Valetta Mole M.D.   On: 03/30/2022 11:24   DG Bone Density  Result Date: 04/06/2022 EXAM: DUAL X-RAY ABSORPTIOMETRY (DXA) FOR BONE MINERAL DENSITY IMPRESSION: Referring Physician:  [name] Your patient completed a bone mineral density test using GE Lunar iDXA system (analysis version: 16). Technologist: [place] PATIENT: Name: Donica, Derouin Patient ID: [id number] Birth Date: 30-Sep-1941 Height: 62.0 in. Sex: Female Measured: 04/06/2022 Weight: 119.0 lbs. Indications: Advanced Age, Bilateral Ovariectomy (65.51), Breast Cancer History, Caucasian, Estrogen Deficient, Hysterectomy, Postmenopausal Fractures: Clavicle, Patella Treatments: None ASSESSMENT: The BMD measured at Forearm Radius 33% is 0.521 g/cm2 with a T-score of -4.0. This patient is considered osteoporotic according to [facility]) criteria. The quality of the exam is good. Site Region Measured Date Measured Age YA BMD Significant CHANGE T-score Left Forearm Radius 33% 6/06/20[age].0 -[age] 0.521 g/cm2 AP Spine L1-L4 04/06/2022 79.0 -1.8 0.967 g/cm2 AP Spine L1-L4 02/08/2016 72.9 -1.10 0.977 g/cm2 * DualFemur Neck Left 04/06/2022 79.0 -2.5 0.696 g/cm2 DualFemur Neck Left 04/04/2020 79.0 -2.6 0.678 g/cm2 * DualFemur Total Mean 04/06/2022 79.0 -2.2 0.730 g/cm2 DualFemur Total Mean 04/04/2020 77.0 -2.3 0.713 g/cm2 * World Health Organization [facility]) criteria for post-menopausal, Caucasian Women: Normal       T-score at or above -1 SD Osteopenia   T-score  between -1 and -2.5 SD Osteoporosis T-score at or below -2.5 SD RECOMMENDATION: 1. All patients should optimize calcium and vitamin D intake. 2. Consider FDA-approved medical therapies in postmenopausal women and men aged 53 years and older, based on the following: a. A hip or vertebral (clinical or morphometric) fracture. b. T-score = -2.5 at the femoral neck or spine after appropriate evaluation to exclude secondary causes. c. Low bone mass (T-score between -1.0 and -2.5 at the femoral neck or spine) and a 10-year probability of a hip fracture = 3% or a 10-year probability of a major osteoporosis-related fracture = 20% based on the [place]-adapted WHO algorithm. d. Clinician judgment and/or patient preferences may indicate treatment for people with 10-year fracture probabilities above or below these levels. FOLLOW-UP: Patients with diagnosis of osteoporosis or at high risk for fracture should have regular bone mineral density tests.? Patients eligible for Medicare are allowed routine testing every 2 years.? The testing frequency can be increased to one year for patients who have rapidly progressing disease, are receiving or discontinuing medical therapy to restore bone mass, or have additional risk factors. I have reviewed this study and agree with the findings. [facility] Electronically Signed   By: Zerita Boers M.D.   On: 04/06/2022 12:32   LONG TERM MONITOR (3-14 DAYS)  Result Date: 04/12/2022 Patch Wear Time:  13 days and 19 hours (2023-04-27T14:23:18-0400 to 2023-05-11T09:37:39-0400) Patient had a min HR of 52 bpm, max HR of 171 bpm, and avg HR of 67 bpm. Predominant underlying rhythm was Sinus Rhythm. 2 Ventricular Tachycardia runs occurred, the run with the fastest interval lasting 9 beats with a max rate of 167 bpm (avg 146 bpm); the run with the fastest interval was also the longest. 35 Supraventricular Tachycardia runs occurred, the run with the fastest interval lasting 6 beats with a max  rate of  171 bpm, the longest lasting 19 beats with an avg rate of 109 bpm. Occasional PVCs with a burden of 1.7%.   denosumab (PROLIA) injection 60 mg     Date Action Dose Route User   03/29/2022 1136 Given 60 mg Subcutaneous (Right Arm) Loreen Freud, CMA          Latest Ref Rng & Units 01/13/2019   10:42 AM  PFT Results  FVC-Pre L 1.81    FVC-Predicted Pre % 70    FVC-Post L 1.94    FVC-Predicted Post % 75    Pre FEV1/FVC % % 75    Post FEV1/FCV % % 77    FEV1-Pre L 1.36    FEV1-Predicted Pre % 70    FEV1-Post L 1.48    DLCO uncorrected ml/min/mmHg 17.28    DLCO UNC% % 96    DLCO corrected ml/min/mmHg 17.12    DLCO COR %Predicted % 95    DLVA Predicted % 116    TLC L 4.89    TLC % Predicted % 102    RV % Predicted % 136      Lab Results  Component Value Date   NITRICOXIDE 34 09/04/2018        Assessment & Plan:   Chronic obstructive airway disease with asthma ([place]) Overall, she seems relatively compensated on current regimen. There were some tree in bud changes on her CT scan from March; overall stable on recent imaging. Possibly mucus plugging or atypical chronic infection; however, she has no cough or infectious symptoms. Recommended she can try mucinex Twice daily.  I think her DOE symptoms are primarily cardiac related given recent echocardiogram, leg swelling, and similar DOE compared to before her procedure.  She has no other pulmonary complaints. No perceived benefit from [place]; ok to change back to Trelegy per pt's request. Does not want to repeat PFTs. Continue cardiopulmonary rehab. Continue allegra for trigger prevention.  Patient Instructions  Restart Trelegy 1 puff daily. Brush tongue and rinse mouth afterwards  Continue Albuterol inhaler 2 puffs or 3 mL neb every 6 hours as needed for shortness of breath or wheezing. Notify if symptoms persist despite rescue inhaler/neb use. Continue allegra 180 mg daily for allergies Continue protonix 40 mg daily     Follow up with cardiology as scheduled.   Follow up in 3 months with Dr. Chase Caller or Alanson Aly. If symptoms do not improve or worsen, please contact office for sooner follow up or seek emergency care.    Mitral valve disease Status post mitral valve clip for severe MR.  Originally felt well after procedure; however began having similar shortness of breath symptoms.  Was seen by cardiology end of April with urgent echo performed.  Did show worsening in her mitral valve regurgitation when compared to postop echo.  She also has a severely dilated LA, G2 DD and PFO. Follow up with cardiology as scheduled    I spent 28 minutes of dedicated to the care of this patient on the date of this encounter to include pre-visit review of records, face-to-face time with the patient discussing conditions above, post visit ordering of testing, clinical documentation with the electronic health record, making appropriate referrals as documented, and communicating necessary findings to members of the patients care team.  Clayton Bibles, NP 04/27/2022  Pt aware and understands NP's role.

## 2022-04-27 NOTE — Assessment & Plan Note (Addendum)
Overall, she seems relatively compensated on current regimen. There were some tree in bud changes on her CT scan from March; overall stable on recent imaging. Possibly mucus plugging or atypical chronic infection; however, she has no cough or infectious symptoms. Recommended she can try mucinex Twice daily.  I think her DOE symptoms are primarily cardiac related given recent echocardiogram, leg swelling, and similar DOE compared to before her procedure.  She has no other pulmonary complaints. No perceived benefit from [place]; ok to change back to Trelegy per pt's request. Does not want to repeat PFTs. Continue cardiopulmonary rehab. Continue allegra for trigger prevention.  Patient Instructions  Restart Trelegy 1 puff daily. Brush tongue and rinse mouth afterwards  Continue Albuterol inhaler 2 puffs or 3 mL neb every 6 hours as needed for shortness of breath or wheezing. Notify if symptoms persist despite rescue inhaler/neb use. Continue allegra 180 mg daily for allergies Continue protonix 40 mg daily    Follow up with cardiology as scheduled.   Follow up in 3 months with Crystal Bass or Crystal Bass. If symptoms do not improve or worsen, please contact office for sooner follow up or seek emergency care.

## 2022-04-30 ENCOUNTER — Ambulatory Visit: Payer: Medicare PPO | Admitting: Physician Assistant

## 2022-04-30 ENCOUNTER — Encounter: Payer: Medicare PPO | Attending: Cardiovascular Disease

## 2022-04-30 VITALS — BP 118/76 | HR 72 | Ht 62.0 in | Wt 123.4 lb

## 2022-04-30 DIAGNOSIS — J449 Chronic obstructive pulmonary disease, unspecified: Secondary | ICD-10-CM

## 2022-04-30 DIAGNOSIS — Z9889 Other specified postprocedural states: Secondary | ICD-10-CM | POA: Insufficient documentation

## 2022-04-30 DIAGNOSIS — I340 Nonrheumatic mitral (valve) insufficiency: Secondary | ICD-10-CM | POA: Diagnosis not present

## 2022-04-30 DIAGNOSIS — R609 Edema, unspecified: Secondary | ICD-10-CM | POA: Diagnosis not present

## 2022-04-30 DIAGNOSIS — Z95818 Presence of other cardiac implants and grafts: Secondary | ICD-10-CM

## 2022-04-30 DIAGNOSIS — R0602 Shortness of breath: Secondary | ICD-10-CM | POA: Diagnosis not present

## 2022-04-30 DIAGNOSIS — Z86711 Personal history of pulmonary embolism: Secondary | ICD-10-CM | POA: Diagnosis not present

## 2022-04-30 MED ORDER — FUROSEMIDE 20 MG PO TABS: 20.0000 mg | ORAL_TABLET | Freq: Every day | ORAL | 3 refills | Status: DC

## 2022-04-30 NOTE — Progress Notes (Signed)
Daily Session Note  Patient Details  Name: Crystal Bass MRN: [id number] Date of Birth: 1941/07/13 Referring Provider:   Flowsheet Row Cardiac Rehab from 03/21/2022 in [facility]  Referring Provider [name] MD       Encounter Date: 04/30/2022  Check In:  Session Check In - 04/30/22 1349       Check-In   Supervising physician immediately available to respond to emergencies See telemetry face sheet for immediately available ER MD    Location [facility]-Cardiac & Pulmonary Rehab    Staff Present Birdie Sons, MPA, Nino Glow, MS, ASCM CEP, Exercise Physiologist;Joseph Tessie Fass, [place]    Virtual Visit No    Medication changes reported     No    Fall or balance concerns reported    No    Tobacco Cessation No Change    Warm-up and Cool-down Performed on first and last piece of equipment    Resistance Training Performed Yes    VAD Patient? No    PAD/SET Patient? No      Pain Assessment   Currently in Pain? No/denies                Social History   Tobacco Use  Smoking Status Never  Smokeless Tobacco Never    Goals Met:  Independence with exercise equipment Exercise tolerated well No report of concerns or symptoms today Strength training completed today  Goals Unmet:  Not Applicable  Comments: Pt able to follow exercise prescription today without complaint.  Will continue to monitor for progression.    Dr. Emily Filbert is Medical Director for [facility].  Dr. Ottie Glazier is Medical Director for [facility] Pulmonary Rehabilitation.

## 2022-04-30 NOTE — Patient Instructions (Addendum)
Medication Instructions:  Your physician has recommended you make the following change in your medication:  START  LASIX 20 MG DAILY  *If you need a refill on your cardiac medications before your next appointment, please call your pharmacy*   Lab Work: TODAY: BMET, BNP If you have labs (blood work) drawn today and your tests are completely normal, you will receive your results only by: [place] (if you have MyChart) OR A paper copy in the mail If you have any lab test that is abnormal or we need to change your treatment, we will call you to review the results.   Testing/Procedures: NONE   Follow-Up: At [facility], you and your health needs are our priority.  As part of our continuing mission to provide you with exceptional heart care, we have created designated Provider Care Teams.  These Care Teams include your primary Cardiologist (physician) and Advanced Practice Providers (APPs -  Physician Assistants and Nurse Practitioners) who all work together to provide you with the care you need, when you need it.  We recommend signing up for the patient portal called "MyChart".  Sign up information is provided on this After Visit Summary.  MyChart is used to connect with patients for Virtual Visits (Telemedicine).  Patients are able to view lab/test results, encounter notes, upcoming appointments, etc.  Non-urgent messages can be sent to your provider as well.   To learn more about what you can do with MyChart, go to [email or web address].    Your next appointment:   KEEP SCHEDULED APPOINTMENT   Important Information About Sugar

## 2022-05-01 ENCOUNTER — Other Ambulatory Visit: Payer: Self-pay | Admitting: Physician Assistant

## 2022-05-01 ENCOUNTER — Encounter: Payer: Self-pay | Admitting: Physician Assistant

## 2022-05-01 ENCOUNTER — Other Ambulatory Visit: Payer: Self-pay | Admitting: Family Medicine

## 2022-05-01 DIAGNOSIS — Z95818 Presence of other cardiac implants and grafts: Secondary | ICD-10-CM

## 2022-05-01 DIAGNOSIS — I340 Nonrheumatic mitral (valve) insufficiency: Secondary | ICD-10-CM

## 2022-05-01 LAB — BASIC METABOLIC PANEL
BUN/Creatinine Ratio: 16 (ref 12–28)
BUN: 12 mg/dL (ref 8–27)
CO2: 24 mmol/L (ref 20–29)
Calcium: 8.9 mg/dL (ref 8.7–10.3)
Chloride: 103 mmol/L (ref 96–106)
Creatinine, Ser: 0.73 mg/dL (ref 0.57–1.00)
Glucose: 89 mg/dL (ref 70–99)
Potassium: 4 mmol/L (ref 3.5–5.2)
Sodium: 140 mmol/L (ref 134–144)
eGFR: 84 mL/min/{1.73_m2} (ref >59)

## 2022-05-01 LAB — ECHOCARDIOGRAM COMPLETE
AR max vel: 2.79 cm2
AV Area VTI: 2.46 cm2
AV Area mean vel: 3.03 cm2
AV Mean grad: 4 mmHg
AV Peak grad: 8.1 mmHg
Ao pk vel: 1.42 m/s
Area-P 1/2: 1.85 cm2
MV M vel: 5.5 m/s
MV Peak grad: 121 mmHg
MV VTI: 1.06 cm2
S' Lateral: 3.7 cm

## 2022-05-01 LAB — PRO B NATRIURETIC PEPTIDE: NT-Pro BNP: 403 pg/mL (ref 0–738)

## 2022-05-02 ENCOUNTER — Encounter (HOSPITAL_COMMUNITY): Payer: Self-pay | Admitting: Cardiovascular Disease

## 2022-05-02 ENCOUNTER — Encounter: Payer: Medicare PPO | Admitting: *Deleted

## 2022-05-02 DIAGNOSIS — Z9889 Other specified postprocedural states: Secondary | ICD-10-CM

## 2022-05-02 NOTE — Progress Notes (Signed)
Attempted to obtain medical history via telephone, unable to reach at this time. HIPAA compliant voicemail message left requesting return call to pre surgical testing department. 

## 2022-05-02 NOTE — Progress Notes (Signed)
Daily Session Note  Patient Details  Name: Crystal Bass MRN: [id number] Date of Birth: 1941/08/31 Referring Provider:   Flowsheet Row Cardiac Rehab from 03/21/2022 in [facility] Cardiac and Pulmonary Rehab  Referring Provider [name] MD       Encounter Date: 05/02/2022  Check In:  Session Check In - 05/02/22 1402       Check-In   Supervising physician immediately available to respond to emergencies See telemetry face sheet for immediately available ER MD    Location [facility]-Cardiac & Pulmonary Rehab    Staff Present Heath Lark, RN, BSN, CCRP;Kelly Bollinger, MPA, RN;Joseph [place], [place]    Virtual Visit No    Fall or balance concerns reported    No    Warm-up and Cool-down Performed on first and last piece of equipment    Resistance Training Performed Yes    VAD Patient? No    PAD/SET Patient? No      Pain Assessment   Currently in Pain? No/denies                Social History   Tobacco Use  Smoking Status Never  Smokeless Tobacco Never    Goals Met:  Independence with exercise equipment Exercise tolerated well No report of concerns or symptoms today  Goals Unmet:  Not Applicable  Comments: Pt able to follow exercise prescription today without complaint.  Will continue to monitor for progression.    Dr. Emily Filbert is Medical Director for [place].  Dr. Ottie Glazier is Medical Director for [facility] Pulmonary Rehabilitation.

## 2022-05-04 ENCOUNTER — Encounter: Payer: Medicare PPO | Admitting: *Deleted

## 2022-05-04 DIAGNOSIS — Z9889 Other specified postprocedural states: Secondary | ICD-10-CM

## 2022-05-04 NOTE — Progress Notes (Signed)
Daily Session Note  Patient Details  Name: Crystal Bass MRN: [id number] Date of Birth: 1941-01-06 Referring Provider:   Flowsheet Row Cardiac Rehab from 03/21/2022 in [facility] Cardiac and Pulmonary Rehab  Referring Provider [name] MD       Encounter Date: 05/04/2022  Check In:  Session Check In - 05/04/22 1143       Check-In   Supervising physician immediately available to respond to emergencies See telemetry face sheet for immediately available ER MD    Location [facility]-Cardiac & Pulmonary Rehab    Staff Present Heath Lark, RN, BSN, CCRP;Joseph [place], [place]    Virtual Visit No    Medication changes reported     No    Fall or balance concerns reported    No    Warm-up and Cool-down Performed on first and last piece of equipment    Resistance Training Performed Yes    VAD Patient? No    PAD/SET Patient? No      Pain Assessment   Currently in Pain? No/denies                Social History   Tobacco Use  Smoking Status Never  Smokeless Tobacco Never    Goals Met:  Independence with exercise equipment Exercise tolerated well No report of concerns or symptoms today  Goals Unmet:  Not Applicable  Comments: Pt able to follow exercise prescription today without complaint.  Will continue to monitor for progression.    Dr. Emily Filbert is Medical Director for [place].  Dr. Ottie Glazier is Medical Director for [facility] Pulmonary Rehabilitation.

## 2022-05-07 ENCOUNTER — Encounter: Payer: Medicare PPO | Admitting: *Deleted

## 2022-05-07 DIAGNOSIS — Z9889 Other specified postprocedural states: Secondary | ICD-10-CM | POA: Diagnosis not present

## 2022-05-07 NOTE — Progress Notes (Signed)
Daily Session Note  Patient Details  Name: Crystal Bass MRN: [id number] Date of Birth: Apr 17, 1941 Referring Provider:   Flowsheet Row Cardiac Rehab from 03/21/2022 in [facility] Cardiac and Pulmonary Rehab  Referring Provider [name] MD       Encounter Date: 05/07/2022  Check In:  Session Check In - 05/07/22 1412       Check-In   Supervising physician immediately available to respond to emergencies See telemetry face sheet for immediately available ER MD    Location [facility]-Cardiac & Pulmonary Rehab    Staff Present Heath Lark, RN, BSN, CCRP;Joseph [place], RCP,RRT,BSRT;Jessica Pace, [place], [place], CCRP, CCET    Virtual Visit No    Medication changes reported     No    Fall or balance concerns reported    No    Warm-up and Cool-down Performed on first and last piece of equipment    Resistance Training Performed Yes    VAD Patient? No    PAD/SET Patient? No      Pain Assessment   Currently in Pain? No/denies                Social History   Tobacco Use  Smoking Status Never  Smokeless Tobacco Never    Goals Met:  Independence with exercise equipment Exercise tolerated well No report of concerns or symptoms today  Goals Unmet:  Not Applicable  Comments: Pt able to follow exercise prescription today without complaint.  Will continue to monitor for progression.    Dr. Emily Filbert is Medical Director for [place].  Dr. Ottie Glazier is Medical Director for [facility] Pulmonary Rehabilitation.

## 2022-05-09 ENCOUNTER — Encounter: Payer: Medicare PPO | Admitting: *Deleted

## 2022-05-09 ENCOUNTER — Encounter: Payer: Self-pay | Admitting: *Deleted

## 2022-05-09 DIAGNOSIS — Z9889 Other specified postprocedural states: Secondary | ICD-10-CM

## 2022-05-09 NOTE — Progress Notes (Signed)
Daily Session Note  Patient Details  Name: Crystal Bass MRN: [id number] Date of Birth: 09-13-1941 Referring Provider:   Flowsheet Row Cardiac Rehab from 03/21/2022 in [facility] Cardiac and Pulmonary Rehab  Referring Provider [name] MD       Encounter Date: 05/09/2022  Check In:  Session Check In - 05/09/22 1418       Check-In   Supervising physician immediately available to respond to emergencies See telemetry face sheet for immediately available ER MD    Location [facility]-Cardiac & Pulmonary Rehab    Staff Present Heath Lark, RN, BSN, CCRP;Melissa [place], RDN, Tawanna Solo, MS, ASCM CEP, Exercise Physiologist    Virtual Visit No    Medication changes reported     No    Fall or balance concerns reported    No    Warm-up and Cool-down Performed on first and last piece of equipment    Resistance Training Performed Yes    VAD Patient? No    PAD/SET Patient? No      Pain Assessment   Currently in Pain? No/denies                Social History   Tobacco Use  Smoking Status Never  Smokeless Tobacco Never    Goals Met:  Independence with exercise equipment Exercise tolerated well No report of concerns or symptoms today  Goals Unmet:  Not Applicable  Comments: Pt able to follow exercise prescription today without complaint.  Will continue to monitor for progression.    Dr. Emily Filbert is Medical Director for [place].  Dr. Ottie Glazier is Medical Director for [facility] Pulmonary Rehabilitation.

## 2022-05-09 NOTE — Progress Notes (Signed)
Cardiac Individual Treatment Plan  Patient Details  Name: Crystal Bass MRN: [id number] Date of Birth: Oct 06, 1941 Referring Provider:   Flowsheet Row Cardiac Rehab from 03/21/2022 in [facility] Cardiac and Pulmonary Rehab  Referring Provider [name] MD       Initial Encounter Date:  Flowsheet Row Cardiac Rehab from 03/21/2022 in [facility] Cardiac and Pulmonary Rehab  Date 03/21/22       Visit Diagnosis: S/P mitral valve repair  Patient's Home Medications on Admission:  Current Outpatient Medications:    acetaminophen (TYLENOL) 500 MG tablet, Take 2 tablets (1,000 mg total) by mouth 2 (two) times daily as needed., Disp: , Rfl:    albuterol (VENTOLIN HFA) 108 (90 Base) MCG/ACT inhaler, Inhale 2 puffs into the lungs every 6 (six) hours as needed for wheezing or shortness of breath. (Patient not taking: Reported on 04/30/2022), Disp: 18 g, Rfl: 1   amLODipine (NORVASC) 2.5 MG tablet, TAKE 1 TABLET BY MOUTH ONCE A DAY, Disp: 30 tablet, Rfl: 3   amoxicillin (AMOXIL) 500 MG capsule, Take 4 capsules (2,000 mg total) by mouth as directed. Take 4 tablets 1 hour prior to dental work, including cleanings., Disp: 12 capsule, Rfl: 12   aspirin 81 MG tablet, Take 1 tablet (81 mg total) by mouth daily., Disp: 30 tablet, Rfl:    benzocaine-menthol (CHLORASEPTIC) 6-10 MG lozenge, Take 1 lozenge by mouth as needed for sore throat., Disp: 100 tablet, Rfl: 0   Carbidopa-Levodopa ER (SINEMET CR) 25-100 MG tablet controlled release, TAKE 2 TABLET BY MOUTH AT 9 AM, 1 TAB AT1 PM AND 1 TAB AT 5 PM, Disp: 360 tablet, Rfl: 1   clonazePAM (KLONOPIN) 0.5 MG tablet, TAKE 1/2 A TABLET BY MOUTH AT BEDTIME, Disp: 45 tablet, Rfl: 1   clopidogrel (PLAVIX) 75 MG tablet, Take 1 tablet (75 mg total) by mouth daily with breakfast., Disp: 90 tablet, Rfl: 0   EPINEPHrine 0.3 mg/0.3 mL IJ SOAJ injection, Inject 0.3 mg into the muscle as needed for anaphylaxis., Disp: , Rfl:    fexofenadine (ALLEGRA) 180 MG tablet, Take 180 mg by  mouth daily., Disp: , Rfl:    Fluticasone-Umeclidin-Vilant (TRELEGY ELLIPTA) 100-62.5-25 MCG/ACT AEPB, Inhale 1 puff into the lungs daily., Disp: 1 each, Rfl: 6   furosemide (LASIX) 20 MG tablet, Take 1 tablet (20 mg total) by mouth daily., Disp: 90 tablet, Rfl: 3   gabapentin ([name]) 300 MG capsule, TAKE 2 CAPSULES BY MOUTH 3 TIMES DAILY, Disp: 540 capsule, Rfl: 1   pantoprazole (PROTONIX) 40 MG tablet, Take 1 tablet (40 mg total) by mouth daily., Disp: 90 tablet, Rfl: 2  Past Medical History: Past Medical History:  Diagnosis Date   Aneurysm of aorta ([place])    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   Anginal pain ([place])    Aortic root aneurysm ([facility])    Arthritis    Asthma    Breast cancer ([place]) 2002   infiltrative ductal carcinoma    Cataract 2019   COPD (chronic obstructive pulmonary disease) ([place]) 07/2007   Ductal carcinoma of breast, estrogen receptor positive, stage 1 ([place]) 09/16/2012   Dyspnea    Endometriosis    GERD (gastroesophageal reflux disease)    Hypercalcemia 09/14/2013   Hypertension    Lesion of breast 1992   right, benign   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 Burt Knack in the A2/P2 position   Traumatic closed fracture of distal clavicle with minimal displacement, left, initial encounter 06/2021   [facility]   Uterine fibroid    Vitamin D deficiency     Tobacco Use: Social History   Tobacco Use  Smoking Status Never  Smokeless Tobacco Never    Labs: Review Flowsheet  More data exists      Latest Ref Rng & Units 01/14/2018 01/19/2019 01/22/2020 08/08/2021  Labs for ITP Cardiac and  Pulmonary Rehab  Cholestrol 0 - 200 mg/dL 172  181  176  171   LDL (calc) 0 - 99 mg/dL 95  101  102  97   HDL-C >39.00 mg/dL 44.90  57.40  48.50  53.30   Trlycerides 0.0 - 149.0 mg/dL 161.0  115.0  126.0  100.0   Hemoglobin A1c 4.6 - 6.5 % 5.3  - - -  PH, Arterial 7.35 - 7.45 - - - -  PCO2 arterial 32 - 48 mmHg - - - -  Bicarbonate 20.0 - 28.0 mmol/L - - - -  O2 Saturation % - - - -      02/12/2022  Labs for ITP Cardiac and Pulmonary Rehab  Cholestrol -  LDL (calc) -  HDL-C -  Trlycerides -  Hemoglobin A1c -  PH, Arterial 7.43   PCO2 arterial 40   Bicarbonate 26.5   O2 Saturation 98.7      Exercise Target Goals: Exercise Program Goal: Individual exercise prescription set using results from initial 6 min walk test and THRR while considering  patient's activity barriers and safety.   Exercise Prescription Goal: Initial exercise prescription builds to 30-45 minutes a day of aerobic activity, 2-3 days per week.  Home exercise guidelines will be given to patient during program as part of exercise prescription that the participant will acknowledge.   Education: Aerobic Exercise: - Group verbal and visual presentation on the components of exercise prescription. Introduces F.I.T.T principle from ACSM for exercise prescriptions.  Reviews F.I.T.T. principles of aerobic exercise including progression. Written material given at graduation. Flowsheet Row Cardiac Rehab from 05/02/2022 in [facility] Cardiac and Pulmonary Rehab  Education need identified 03/21/22       Education: Resistance Exercise: - Group verbal and visual presentation on the components of exercise prescription. Introduces F.I.T.T principle from ACSM for exercise prescriptions  Reviews F.I.T.T. principles of resistance exercise including progression. Written material given at graduation.    Education: Exercise & Equipment Safety: - Individual verbal instruction and demonstration of equipment use and safety with use of the  equipment. Flowsheet Row Cardiac Rehab from 05/02/2022 in [facility] Cardiac and Pulmonary Rehab  Date 03/21/22  Educator [facility]  Instruction Review Code 1- Verbalizes Understanding       Education: Exercise Physiology & General Exercise Guidelines: - Group verbal and written instruction with models to review the exercise physiology of the cardiovascular system and associated critical values. Provides general exercise guidelines with specific guidelines to those with heart or lung disease.    Education: Flexibility, Balance, Mind/Body Relaxation: - Group verbal and visual presentation with interactive activity on the components of exercise prescription. Introduces F.I.T.T principle from ACSM for exercise prescriptions. Reviews F.I.T.T. principles of flexibility and balance exercise training including progression. Also discusses the mind body  connection.  Reviews various relaxation techniques to help reduce and manage stress (i.e. Deep breathing, progressive muscle relaxation, and visualization). Balance handout provided to take home. Written material given at graduation. Flowsheet Row Cardiac Rehab from 05/02/2022 in [facility] Cardiac and Pulmonary Rehab  Date 04/11/22  Educator [place]  Instruction Review Code 1- Verbalizes Understanding       Activity Barriers & Risk Stratification:  Activity Barriers & Cardiac Risk Stratification - 03/21/22 1506       Activity Barriers & Cardiac Risk Stratification   Activity Barriers Shortness of Breath;Neck/Spine Problems;Joint Problems;Other (comment);Deconditioning;Muscular Weakness;Balance Concerns;History of Falls;Arthritis;Back Problems    Comments Parkinsons    Cardiac Risk Stratification Moderate             6 Minute Walk:  6 Minute Walk     Row Name 03/21/22 1504         6 Minute Walk   Phase Initial     Distance 800 feet     Walk Time 6 minutes     # of Rest Breaks 0     MPH 1.51     METS 1.61     RPE 16     Perceived Dyspnea  3     VO2  Peak 5.64     Symptoms Yes (comment)     Comments SOB, fatigue     Resting HR 67 bpm     Resting BP 110/64     Resting Oxygen Saturation  97 %     Exercise Oxygen Saturation  during 6 min walk 91 %     Max Ex. HR 86 bpm     Max Ex. BP 128/70     2 Minute Post BP 114/62       Interval HR   1 Minute HR 79     2 Minute HR 81     3 Minute HR 84     4 Minute HR 84     5 Minute HR 85     6 Minute HR 86     Interval Heart Rate? Yes  Monitored as office notes mentioned SOB with exertion       Interval Oxygen   Interval Oxygen? Yes     Baseline Oxygen Saturation % 97 %     1 Minute Oxygen Saturation % 97 %     1 Minute Liters of Oxygen 0 L  Room Air     2 Minute Oxygen Saturation % 94 %     2 Minute Liters of Oxygen 0 L     3 Minute Oxygen Saturation % 98 %     3 Minute Liters of Oxygen 0 L     4 Minute Oxygen Saturation % 95 %     4 Minute Liters of Oxygen 0 L     5 Minute Oxygen Saturation % 92 %     5 Minute Liters of Oxygen 0 L     6 Minute Oxygen Saturation % 91 %     6 Minute Liters of Oxygen 0 L     2 Minute Post Oxygen Saturation % 98 %     2 Minute Post Liters of Oxygen 0 L              Oxygen Initial Assessment:   Oxygen Re-Evaluation:   Oxygen Discharge (Final Oxygen Re-Evaluation):   Initial Exercise Prescription:  Initial Exercise Prescription - 03/21/22 1500       Date of Initial Exercise RX and Referring  Provider   Date 03/21/22    Referring Provider [name] MD      Oxygen   Maintain Oxygen Saturation 88% or higher      Recumbant Bike   Level 1    RPM 50    Watts 10    Minutes 15    METs 1.5      T5 Nustep   Level 1    SPM 80    Minutes 15    METs 1.5      Biostep-RELP   Level 1    SPM 50    Minutes 15    METs 2      Track   Laps 21    Minutes 15    METs 2.14      Prescription Details   Frequency (times per week) 3    Duration Progress to 30 minutes of continuous aerobic without signs/symptoms of physical distress       Intensity   THRR 40-80% of Max Heartrate 97-126    Ratings of Perceived Exertion 11-13    Perceived Dyspnea 0-4      Progression   Progression Continue to progress workloads to maintain intensity without signs/symptoms of physical distress.      Resistance Training   Training Prescription Yes    Weight 2 lb    Reps 10-15             Perform Capillary Blood Glucose checks as needed.  Exercise Prescription Changes:   Exercise Prescription Changes     Row Name 03/21/22 1500 04/02/22 0800 04/17/22 1600 04/18/22 1500 04/30/22 1400     Response to Exercise   Blood Pressure (Admit) 110/64 122/64 122/74 -- 132/70   Blood Pressure (Exercise) 128/70 130/64 134/58 -- 126/70   Blood Pressure (Exit) 114/62 120/66 138/68 -- 118/62   Heart Rate (Admit) 67 bpm 82 bpm 75 bpm -- 78 bpm   Heart Rate (Exercise) 86 bpm 88 bpm 104 bpm -- 85 bpm   Heart Rate (Exit) 72 bpm 74 bpm 83 bpm -- 73 bpm   Oxygen Saturation (Admit) 97 % -- -- -- --   Oxygen Saturation (Exercise) 91 % -- -- -- --   Oxygen Saturation (Exit) 98 % -- -- -- --   Rating of Perceived Exertion (Exercise) '16 14 15 '$ -- 13   Perceived Dyspnea (Exercise) 3 -- -- -- --   Symptoms SOB, fatigue fatigue fatigue -- none   Comments walk test results first full day of exercise -- -- --   Duration -- Progress to 30 minutes of  aerobic without signs/symptoms of physical distress Progress to 30 minutes of  aerobic without signs/symptoms of physical distress -- Continue with 30 min of aerobic exercise without signs/symptoms of physical distress.   Intensity -- THRR unchanged THRR unchanged -- THRR unchanged     Progression   Progression -- Continue to progress workloads to maintain intensity without signs/symptoms of physical distress. Continue to progress workloads to maintain intensity without signs/symptoms of physical distress. -- Continue to progress workloads to maintain intensity without signs/symptoms of physical distress.    Average METs -- 2.03 1.92 -- 1.9     Resistance Training   Training Prescription -- Yes Yes -- Yes   Weight -- 2 lb 2 lb -- 2 lb   Reps -- 10-15 10-15 -- 10-15     Interval Training   Interval Training -- No No -- No     Recumbant Bike   Level --  1 -- -- --   Watts -- 11 -- -- --   Minutes -- 15 -- -- --   METs -- 2.57 -- -- --     NuStep   Level -- -- -- -- 3   Minutes -- -- -- -- 15   METs -- -- -- -- 1.9     T5 Nustep   Level -- 1 1 -- 1   Minutes -- 15 15 -- 15   METs -- 1.5 1.8 -- 1.5     Biostep-RELP   Level -- -- 1 -- 2   Minutes -- -- 15 -- 15   METs -- -- 2 -- 2     Track   Laps -- -- 29 -- 22   Minutes -- -- 15 -- 15   METs -- -- 2.58 -- 2.2     Home Exercise Plan   Plans to continue exercise at -- -- -- [employer] (comment)  [employer] (comment)  [employer]   Frequency -- -- -- Add 2 additional days to program exercise sessions. Add 2 additional days to program exercise sessions.   Initial Home Exercises Provided -- -- -- 04/18/22 04/18/22     Oxygen   Maintain Oxygen Saturation -- 88% or higher 88% or higher -- 88% or higher            Exercise Comments:   Exercise Comments     Row Name 03/26/22 1331           Exercise Comments First full day of exercise!  Patient was oriented to gym and equipment including functions, settings, policies, and procedures.  Patient's individual exercise prescription and treatment plan were reviewed.  All starting workloads were established based on the results of the 6 minute walk test done at initial orientation visit.  The plan for exercise progression was also introduced and progression will be customized based on patient's performance and goals.                Exercise Goals and Review:   Exercise Goals     Row Name 03/21/22 1508             Exercise Goals   Increase Physical Activity Yes       Intervention Provide advice, education, support and counseling  about physical activity/exercise needs.;Develop an individualized exercise prescription for aerobic and resistive training based on initial evaluation findings, risk stratification, comorbidities and participant's personal goals.       Expected Outcomes Short Term: Attend rehab on a regular basis to increase amount of physical activity.;Long Term: Add in home exercise to make exercise part of routine and to increase amount of physical activity.;Long Term: Exercising regularly at least 3-5 days a week.       Increase Strength and Stamina Yes       Intervention Provide advice, education, support and counseling about physical activity/exercise needs.;Develop an individualized exercise prescription for aerobic and resistive training based on initial evaluation findings, risk stratification, comorbidities and participant's personal goals.       Expected Outcomes Short Term: Increase workloads from initial exercise prescription for resistance, speed, and METs.;Short Term: Perform resistance training exercises routinely during rehab and add in resistance training at home;Long Term: Improve cardiorespiratory fitness, muscular endurance and strength as measured by increased METs and functional capacity (6MWT)       Able to understand and use rate of perceived exertion (RPE) scale Yes  Intervention Provide education and explanation on how to use RPE scale       Expected Outcomes Short Term: Able to use RPE daily in rehab to express subjective intensity level;Long Term:  Able to use RPE to guide intensity level when exercising independently       Able to understand and use Dyspnea scale Yes       Intervention Provide education and explanation on how to use Dyspnea scale       Expected Outcomes Short Term: Able to use Dyspnea scale daily in rehab to express subjective sense of shortness of breath during exertion;Long Term: Able to use Dyspnea scale to guide intensity level when exercising independently        Knowledge and understanding of Target Heart Rate Range (THRR) Yes       Intervention Provide education and explanation of THRR including how the numbers were predicted and where they are located for reference       Expected Outcomes Short Term: Able to state/look up THRR;Short Term: Able to use daily as guideline for intensity in rehab;Long Term: Able to use THRR to govern intensity when exercising independently       Able to check pulse independently Yes       Intervention Provide education and demonstration on how to check pulse in carotid and radial arteries.;Review the importance of being able to check your own pulse for safety during independent exercise       Expected Outcomes Short Term: Able to explain why pulse checking is important during independent exercise;Long Term: Able to check pulse independently and accurately       Understanding of Exercise Prescription Yes       Intervention Provide education, explanation, and written materials on patient's individual exercise prescription       Expected Outcomes Short Term: Able to explain program exercise prescription;Long Term: Able to explain home exercise prescription to exercise independently                Exercise Goals Re-Evaluation :  Exercise Goals Re-Evaluation     Row Name 03/26/22 1331 04/02/22 0850 04/17/22 1608 04/18/22 1514 04/30/22 1428     Exercise Goal Re-Evaluation   Exercise Goals Review Increase Physical Activity;Able to understand and use rate of perceived exertion (RPE) scale;Knowledge and understanding of Target Heart Rate Range (THRR);Understanding of Exercise Prescription;Able to understand and use Dyspnea scale;Able to check pulse independently;Increase Strength and Stamina Increase Physical Activity;Increase Strength and Stamina;Understanding of Exercise Prescription Increase Physical Activity;Increase Strength and Stamina;Understanding of Exercise Prescription Increase Physical Activity;Increase Strength and  Stamina;Understanding of Exercise Prescription Increase Physical Activity;Increase Strength and Stamina;Understanding of Exercise Prescription   Comments Reviewed RPE and dyspnea scales, THR and program prescription with pt today.  Pt voiced understanding and was given a copy of goals to take home. Tequila has only had one visit thus far.  We attempted a phone call last week to check, but had to leave a message without return call.  We will try again this week if she does not come today. Velencia is coming to rehab consistently. She is off to a good start with being able to complete 29 laps on the track! She hits her highest HR when walking. We will continue to monitor as she progresses. Reviewed home exercise with pt today.  Pt plans to use her gym at her living facility at [facility] for exercise.  She also likes to walk as well. Reviewed THR, pulse, RPE, sign and symptoms, pulse  oximetery and when to call 911 or MD.  Also discussed weather considerations and indoor options.  Pt voiced understanding. Darcella is doing well in rehab.  She is up to 22 laps on the track.  She is also on level 3 for the NuStep.  We will continue to monitor his progress.   Expected Outcomes Short: Use RPE daily to regulate intensity. Long: Follow program prescription in THR. Short: Attend rehab regularly Long: Follow program prescription Short: Continue to increase laps on track Long: Continue to increase overall strength and stamina Short: Start checking HR during exercise Long: Continue to exercise independently at home at appropriate prescription Short: Continue to increase workloads Long; Conitnue to improve stamina            Discharge Exercise Prescription (Final Exercise Prescription Changes):  Exercise Prescription Changes - 04/30/22 1400       Response to Exercise   Blood Pressure (Admit) 132/70    Blood Pressure (Exercise) 126/70    Blood Pressure (Exit) 118/62    Heart Rate (Admit) 78 bpm    Heart Rate  (Exercise) 85 bpm    Heart Rate (Exit) 73 bpm    Rating of Perceived Exertion (Exercise) 13    Symptoms none    Duration Continue with 30 min of aerobic exercise without signs/symptoms of physical distress.    Intensity THRR unchanged      Progression   Progression Continue to progress workloads to maintain intensity without signs/symptoms of physical distress.    Average METs 1.9      Resistance Training   Training Prescription Yes    Weight 2 lb    Reps 10-15      Interval Training   Interval Training No      NuStep   Level 3    Minutes 15    METs 1.9      T5 Nustep   Level 1    Minutes 15    METs 1.5      Biostep-RELP   Level 2    Minutes 15    METs 2      Track   Laps 22    Minutes 15    METs 2.2      Home Exercise Plan   Plans to continue exercise at [employer] (comment)   [employer]   Frequency Add 2 additional days to program exercise sessions.    Initial Home Exercises Provided 04/18/22      Oxygen   Maintain Oxygen Saturation 88% or higher             Nutrition:  Target Goals: Understanding of nutrition guidelines, daily intake of sodium '1500mg'$ , cholesterol '200mg'$ , calories 30% from fat and 7% or less from saturated fats, daily to have 5 or more servings of fruits and vegetables.  Education: All About Nutrition: -Group instruction provided by verbal, written material, interactive activities, discussions, models, and posters to present general guidelines for heart healthy nutrition including fat, fiber, MyPlate, the role of sodium in heart healthy nutrition, utilization of the nutrition label, and utilization of this knowledge for meal planning. Follow up email sent as well. Written material given at graduation. Flowsheet Row Cardiac Rehab from 05/02/2022 in [facility] Cardiac and Pulmonary Rehab  Education need identified 03/21/22  Date 04/18/22  Educator [facility]  Instruction Review Code 1- [employer] Understanding       Biometrics:   Post  Biometrics - 03/21/22 [place]  Height 5' 3.1" (1.603 m)    Weight 122 lb 1.6 oz (55.4 kg)    BMI (Calculated) 21.55    Single Leg Stand 16.6 seconds             Nutrition Therapy Plan and Nutrition Goals:  Nutrition Therapy & Goals - 04/03/22 0726       Nutrition Therapy   RD appointment deferred Yes   Rendi would like to defer nutrition consultation at this time and is interested in scheduling at a later date.     Personal Nutrition Goals   Nutrition Goal Christella would like to defer nutrition consultation at this time and is interested in scheduling at a later date.             Nutrition Assessments:  MEDIFICTS Score Key: ?70 Need to make dietary changes  40-70 Heart Healthy Diet ? 40 Therapeutic Level Cholesterol Diet  Flowsheet Row Cardiac Rehab from 03/21/2022 in [facility] Cardiac and Pulmonary Rehab  Picture Your Plate Total Score on Admission 67      Picture Your Plate Scores: <36 Unhealthy dietary pattern with much room for improvement. 41-50 Dietary pattern unlikely to meet recommendations for good health and room for improvement. 51-60 More healthful dietary pattern, with some room for improvement.  >60 Healthy dietary pattern, although there may be some specific behaviors that could be improved.    Nutrition Goals Re-Evaluation:  Nutrition Goals Re-Evaluation     [place] Name 04/18/22 1515             Goals   Nutrition Goal Patient has still declined nutrition at this time. She is attending the group nutrition education class today and said she would consider making an appointment with the RD if she decides to do so. She will let us know what she decides.                Nutrition Goals Discharge (Final Nutrition Goals Re-Evaluation):  Nutrition Goals Re-Evaluation - 04/18/22 1515       Goals   Nutrition Goal Patient has still declined nutrition at this time. She is attending the group nutrition education class today and  said she would consider making an appointment with the RD if she decides to do so. She will let us know what she decides.             Psychosocial: Target Goals: Acknowledge presence or absence of significant depression and/or stress, maximize coping skills, provide positive support system. Participant is able to verbalize types and ability to use techniques and skills needed for reducing stress and depression.   Education: Stress, Anxiety, and Depression - Group verbal and visual presentation to define topics covered.  Reviews how body is impacted by stress, anxiety, and depression.  Also discusses healthy ways to reduce stress and to treat/manage anxiety and depression.  Written material given at graduation.   Education: Sleep Hygiene -Provides group verbal and written instruction about how sleep can affect your health.  Define sleep hygiene, discuss sleep cycles and impact of sleep habits. Review good sleep hygiene tips.    Initial Review & Psychosocial Screening:  Initial Psych Review & Screening - 03/12/22 1546       Initial Review   Current issues with Current Stress Concerns    Source of Stress Concerns Unable to participate in former interests or hobbies;Unable to perform yard/household activities      [place]? Yes   family  Barriers   Psychosocial barriers to participate in program There are no identifiable barriers or psychosocial needs.;The patient should benefit from training in stress management and relaxation.      Screening Interventions   Interventions Encouraged to exercise;Provide feedback about the scores to participant;To provide support and resources with identified psychosocial needs    Expected Outcomes Long Term Goal: Stressors or current issues are controlled or eliminated.;Short Term goal: Utilizing psychosocial counselor, staff and physician to assist with identification of specific Stressors or current issues interfering  with healing process. Setting desired goal for each stressor or current issue identified.;Long Term goal: The participant improves quality of Life and PHQ9 Scores as seen by post scores and/or verbalization of changes;Short Term goal: Identification and review with participant of any Quality of Life or Depression concerns found by scoring the questionnaire.             Quality of Life Scores:   Quality of Life - 03/21/22 1508       Quality of Life   Select Quality of Life      Quality of Life Scores   Health/Function Pre 12.86 %    Socioeconomic Pre 21.5 %    Psych/Spiritual Pre 20.83 %    Family Pre 27.5 %    GLOBAL Pre 17.63 %            Scores of 19 and below usually indicate a poorer quality of life in these areas.  A difference of  2-3 points is a clinically meaningful difference.  A difference of 2-3 points in the total score of the Quality of Life Index has been associated with significant improvement in overall quality of life, self-image, physical symptoms, and general health in studies assessing change in quality of life.  PHQ-9: Review Flowsheet  More data exists      04/16/2022 03/21/2022 08/08/2021 06/14/2021 04/01/2020  Depression screen PHQ 2/9  Decreased Interest 1 2 0 0 0  Down, Depressed, Hopeless 0 1 0 0 0  PHQ - 2 Score 1 3 0 0 0  Altered sleeping 3 1 - - -  Tired, decreased energy 2 2 - - -  Change in appetite 3 2 - - -  Feeling bad or failure about yourself  0 1 - - -  Trouble concentrating 0 0 - - -  Moving slowly or fidgety/restless 0 0 - - -  Suicidal thoughts 0 0 - - -  PHQ-9 Score 9 9 - - -  Difficult doing work/chores Not difficult at all Somewhat difficult - - -   Interpretation of Total Score  Total Score Depression Severity:  1-4 = Minimal depression, 5-9 = Mild depression, 10-14 = Moderate depression, 15-19 = Moderately severe depression, 20-27 = Severe depression   Psychosocial Evaluation and Intervention:  Psychosocial Evaluation -  03/12/22 1547       Psychosocial Evaluation & Interventions   Interventions Encouraged to exercise with the program and follow exercise prescription    Comments Jordy is coming to cardiac rehab for mitral valve repair. She has noticed a little difference post surgery, but she states her breathing issues come mainly from her COPD. She also was diagnosed with parkinson's in 2016. She lives at [facility] and still drives. She has been trying to walk more, but her breathing has gotten in the way. She is hopeful this program will help with both post op and COPD. She does take Klonopin to help sleep and with the Parkinson nightmares, which she  states is managing both concerns.    Expected Outcomes Short: attend cardiac rehab for education and exercise. Long; develop and maintain positive self care habits.    Continue Psychosocial Services  Follow up required by staff             Psychosocial Re-Evaluation:  Psychosocial Re-Evaluation     [place] Name 04/16/22 1345 04/18/22 1519           Psychosocial Re-Evaluation   Current issues with Current Stress Concerns Current Sleep Concerns      Comments Reviewed patient health questionnaire (PHQ-9) with patient for follow up. Previously, patients score indicated signs/symptoms of depression.  Reviewed to see if patient is improving symptom wise while in program.  Score stayed the same. Patient is working towards improving her mental health by exercising. Sabreena is doing well mentally. She saw her provider yesterday for her Parkinson's and says her doctor is pleased with how she is doing and she feels she is managing it well. Her COPD is still limiting her breathing but hopes her exercise will make it improve overtime. She still practices PLB when she needs to. She states she feels she sleeps too much and has discussed it with her doctors who said they are not concerned about it. Informed her to say something to her doctor if she feels like it is impairing  her lifestyle. She is not on any medications for depression or anxiety and declines any other problems at this time.      Expected Outcomes Short: Continue to work toward an improvement in [place] scores by attending [facility] regularly. Long: Continue to improve stress and depression coping skills by talking with staff and attending [facility] regularly and work toward a positive mental state. Short: Continue to attend rehab regularly Long: Continue to maintain positive attitude      Interventions Encouraged to attend Cardiac Rehabilitation for the exercise Encouraged to attend Cardiac Rehabilitation for the exercise      Continue Psychosocial Services  Follow up required by staff Follow up required by staff               Psychosocial Discharge (Final Psychosocial Re-Evaluation):  Psychosocial Re-Evaluation - 04/18/22 1519       Psychosocial Re-Evaluation   Current issues with Current Sleep Concerns    Comments Letita is doing well mentally. She saw her provider yesterday for her Parkinson's and says her doctor is pleased with how she is doing and she feels she is managing it well. Her COPD is still limiting her breathing but hopes her exercise will make it improve overtime. She still practices PLB when she needs to. She states she feels she sleeps too much and has discussed it with her doctors who said they are not concerned about it. Informed her to say something to her doctor if she feels like it is impairing her lifestyle. She is not on any medications for depression or anxiety and declines any other problems at this time.    Expected Outcomes Short: Continue to attend rehab regularly Long: Continue to maintain positive attitude    Interventions Encouraged to attend Cardiac Rehabilitation for the exercise    Continue Psychosocial Services  Follow up required by staff             Vocational Rehabilitation: Provide vocational rehab assistance to qualifying candidates.   Vocational  Rehab Evaluation & Intervention:  Vocational Rehab - 03/12/22 1538       Initial Vocational Rehab Evaluation & Intervention  Assessment shows need for Vocational Rehabilitation No             Education: Education Goals: Education classes will be provided on a variety of topics geared toward better understanding of heart health and risk factor modification. Participant will state understanding/return demonstration of topics presented as noted by education test scores.  Learning Barriers/Preferences:  Learning Barriers/Preferences - 03/12/22 1536       Learning Barriers/Preferences   Learning Barriers None    Learning Preferences Individual Instruction             General Cardiac Education Topics:  AED/CPR: - Group verbal and written instruction with the use of models to demonstrate the basic use of the AED with the basic ABC's of resuscitation.   Anatomy and Cardiac Procedures: - Group verbal and visual presentation and models provide information about basic cardiac anatomy and function. Reviews the testing methods done to diagnose heart disease and the outcomes of the test results. Describes the treatment choices: Medical Management, Angioplasty, or Coronary Bypass Surgery for treating various heart conditions including Myocardial Infarction, Angina, Valve Disease, and Cardiac Arrhythmias.  Written material given at graduation. Flowsheet Row Cardiac Rehab from 05/02/2022 in [facility] Cardiac and Pul[facility] Rehab  Education need identified 03/21/22       Medication Safety: - Group verbal and visual instruction to review commonly prescribed medications for heart and lung disease. Reviews the medication, class of the drug, and side effects. Includes the steps to properly store meds and maintain the prescription regimen.  Written material given at graduation.   Intimacy: - Group verbal instruction through game format to discuss how heart and lung disease can affect sexual  intimacy. Written material given at graduation..   Know Your Numbers and Heart Failure: - Group verbal and visual instruction to discuss disease risk factors for cardiac and pulmonary disease and treatment options.  Reviews associated critical values for Overweight/Obesity, Hypertension, Cholesterol, and Diabetes.  Discusses basics of heart failure: signs/symptoms and treatments.  Introduces Heart Failure Zone chart for action plan for heart failure.  Written material given at graduation. Flowsheet Row Cardiac Rehab from 05/02/2022 in [facility] Cardiac and Pulmonary Rehab  Date 05/02/22  Educator SB  Instruction Review Code 1- Verbalizes Understanding       Infection Prevention: - Provides verbal and written material to individual with discussion of infection control including proper hand washing and proper equipment cleaning during exercise session. Flowsheet Row Cardiac Rehab from 05/02/2022 in [facility] Cardiac and Pulmonary Rehab  Date 03/21/22  Educator [facility]  Instruction Review Code 1- Verbalizes Understanding       Falls Prevention: - Provides verbal and written material to individual with discussion of falls prevention and safety. Flowsheet Row Cardiac Rehab from 05/02/2022 in [facility] Cardiac and Pulmonary Rehab  Date 03/21/22  Educator [place]  Instruction Review Code 1- Verbalizes Understanding       Other: -Provides group and verbal instruction on various topics (see comments)   Knowledge Questionnaire Score:  Knowledge Questionnaire Score - 03/21/22 1509       Knowledge Questionnaire Score   Pre Score 19/26             Core Components/Risk Factors/Patient Goals at Admission:  Personal Goals and Risk Factors at Admission - 03/21/22 1509       Core Components/Risk Factors/Patient Goals on Admission    Weight Management Yes;Weight Maintenance    Intervention Weight Management: Develop a combined nutrition and exercise program designed to reach desired caloric intake, while  maintaining appropriate intake of nutrient and fiber, sodium and fats, and appropriate energy expenditure required for the weight goal.;Weight Management: Provide education and appropriate resources to help participant work on and attain dietary goals.    Admit Weight 122 lb 1.6 oz (55.4 kg)    Goal Weight: Short Term 123 lb (55.8 kg)    Goal Weight: Long Term 123 lb (55.8 kg)    Expected Outcomes Short Term: Continue to assess and modify interventions until short term weight is achieved;Long Term: Adherence to nutrition and physical activity/exercise program aimed toward attainment of established weight goal;Weight Maintenance: Understanding of the daily nutrition guidelines, which includes 25-35% calories from fat, 7% or less cal from saturated fats, less than '200mg'$  cholesterol, less than 1.5gm of sodium, & 5 or more servings of fruits and vegetables daily    Improve shortness of breath with ADL's Yes    Intervention Provide education, individualized exercise plan and daily activity instruction to help decrease symptoms of SOB with activities of daily living.    Expected Outcomes Short Term: Improve cardiorespiratory fitness to achieve a reduction of symptoms when performing ADLs;Long Term: Be able to perform more ADLs without symptoms or delay the onset of symptoms    Hypertension Yes    Intervention Provide education on lifestyle modifcations including regular physical activity/exercise, weight management, moderate sodium restriction and increased consumption of fresh fruit, vegetables, and low fat dairy, alcohol moderation, and smoking cessation.;Monitor prescription use compliance.    Expected Outcomes Short Term: Continued assessment and intervention until BP is < 140/63m HG in hypertensive participants. < 130/867mHG in hypertensive participants with diabetes, heart failure or chronic kidney disease.;Long Term: Maintenance of blood pressure at goal levels.             Education:Diabetes -  Individual verbal and written instruction to review signs/symptoms of diabetes, desired ranges of glucose level fasting, after meals and with exercise. Acknowledge that pre and post exercise glucose checks will be done for 3 sessions at entry of program.   Core Components/Risk Factors/Patient Goals Review:   Goals and Risk Factor Review     Row Name 04/18/22 1516             Core Components/Risk Factors/Patient Goals Review   Personal Goals Review Weight Management/Obesity;Hypertension;Improve shortness of breath with ADL's       Review BrAzariyahs doing well in rehab. She is checking her weight at home and is trying to at least maintain if not gain some weight. Her lowest she has been is 117 lb and monitor to make sure she does not have any significant weight loss. We talked about using her clothes fitting as a good measurement as well as she could be increasing some muscle mass. She is also checking her BP at home which seems to be stable. She reports 12938ystolic and 6018-29Hiastolic.  Her SOB is very limiting with her daily activites due to her COPD which she does not see so much of an improvement yet. She enjoys the program and is staying compliant taking all her medications.       Expected Outcomes Short: Continue to monitor weight and make note of any abnormal changes Long: Continue to manage lifestyle risk factors                Core Components/Risk Factors/Patient Goals at Discharge (Final Review):   Goals and Risk Factor Review - 04/18/22 1516       Core Components/Risk Factors/Patient Goals Review  Personal Goals Review Weight Management/Obesity;Hypertension;Improve shortness of breath with ADL's    Review Maddeline is doing well in rehab. She is checking her weight at home and is trying to at least maintain if not gain some weight. Her lowest she has been is 117 lb and monitor to make sure she does not have any significant weight loss. We talked about using her clothes fitting as  a good measurement as well as she could be increasing some muscle mass. She is also checking her BP at home which seems to be stable. She reports 563 systolic and 14-97W diastolic.  Her SOB is very limiting with her daily activites due to her COPD which she does not see so much of an improvement yet. She enjoys the program and is staying compliant taking all her medications.    Expected Outcomes Short: Continue to monitor weight and make note of any abnormal changes Long: Continue to manage lifestyle risk factors             ITP Comments:  ITP Comments     Row Name 03/12/22 1557 03/21/22 1504 03/26/22 1331 04/11/22 0914 05/09/22 1346   ITP Comments Initial telephone orientation completed. Diagnosis can be found in [facility] 3/23. EP orientation scheduled for Wednesday 4/26 at 1:30pm Completed 6MWT and gym orientation. Initial ITP created and sent for review to Dr. Emily Filbert, Medical Director. First full day of exercise!  Patient was oriented to gym and equipment including functions, settings, policies, and procedures.  Patient's individual exercise prescription and treatment plan were reviewed.  All starting workloads were established based on the results of the 6 minute walk test done at initial orientation visit.  The plan for exercise progression was also introduced and progression will be customized based on patient's performance and goals. 30 Day review completed. Medical Director ITP review done, changes made as directed, and signed approval by Medical Director.    NEW 30 Day review completed. Medical Director ITP review done, changes made as directed, and signed approval by Medical Director.            Comments:

## 2022-05-11 ENCOUNTER — Encounter: Payer: Medicare PPO | Admitting: *Deleted

## 2022-05-11 DIAGNOSIS — Z9889 Other specified postprocedural states: Secondary | ICD-10-CM

## 2022-05-11 NOTE — Progress Notes (Signed)
Daily Session Note  Patient Details  Name: Crystal Bass MRN: [id number] Date of Birth: 01/31/1941 Referring Provider:   Flowsheet Row Cardiac Rehab from 03/21/2022 in [facility] Cardiac and Pulmonary Rehab  Referring Provider Arida, Muhammad MD       Encounter Date: 05/11/2022  Check In:  Session Check In - 05/11/22 1146       Check-In   Supervising physician immediately available to respond to emergencies See telemetry face sheet for immediately available ER MD    Location [facility]-Cardiac & Pulmonary Rehab    Staff Present  , RN, BSN, CCRP;Jessica Hawkins, MA, RCEP, CCRP, CCET;Joseph Hood, RCP,RRT,BSRT    Virtual Visit No    Medication changes reported     No    Fall or balance concerns reported    No    Warm-up and Cool-down Performed on first and last piece of equipment    Resistance Training Performed Yes    VAD Patient? No    PAD/SET Patient? No      Pain Assessment   Currently in Pain? No/denies                Social History   Tobacco Use  Smoking Status Never  Smokeless Tobacco Never    Goals Met:  Independence with exercise equipment Exercise tolerated well No report of concerns or symptoms today  Goals Unmet:  Not Applicable  Comments: Pt able to follow exercise prescription today without complaint.  Will continue to monitor for progression.    Dr. Mark Miller is Medical Director for [facility].  Dr. Fuad Aleskerov is Medical Director for [facility]. 

## 2022-05-14 ENCOUNTER — Other Ambulatory Visit: Payer: Self-pay

## 2022-05-14 ENCOUNTER — Ambulatory Visit (HOSPITAL_COMMUNITY): Payer: Medicare PPO | Admitting: Certified Registered Nurse Anesthetist

## 2022-05-14 ENCOUNTER — Ambulatory Visit (HOSPITAL_BASED_OUTPATIENT_CLINIC_OR_DEPARTMENT_OTHER): Payer: Medicare PPO | Admitting: Certified Registered Nurse Anesthetist

## 2022-05-14 ENCOUNTER — Ambulatory Visit (HOSPITAL_BASED_OUTPATIENT_CLINIC_OR_DEPARTMENT_OTHER): Payer: Medicare PPO

## 2022-05-14 ENCOUNTER — Encounter (HOSPITAL_COMMUNITY)
Admission: RE | Disposition: A | Discharge status: Home / Self Care | Payer: Self-pay | Source: Home / Self Care | Attending: Cardiovascular Disease

## 2022-05-14 ENCOUNTER — Encounter (HOSPITAL_COMMUNITY): Payer: Self-pay | Admitting: Cardiovascular Disease

## 2022-05-14 ENCOUNTER — Ambulatory Visit (HOSPITAL_COMMUNITY)
Admission: RE | Admit: 2022-05-14 | Discharge: 2022-05-14 | Disposition: A | Discharge status: Home / Self Care | Payer: Medicare PPO | Attending: Cardiovascular Disease | Admitting: Cardiovascular Disease

## 2022-05-14 ENCOUNTER — Other Ambulatory Visit (HOSPITAL_COMMUNITY): Payer: Self-pay | Admitting: *Deleted

## 2022-05-14 DIAGNOSIS — Z8774 Personal history of (corrected) congenital malformations of heart and circulatory system: Secondary | ICD-10-CM | POA: Insufficient documentation

## 2022-05-14 DIAGNOSIS — G20 Parkinson's disease: Secondary | ICD-10-CM | POA: Diagnosis not present

## 2022-05-14 DIAGNOSIS — J449 Chronic obstructive pulmonary disease, unspecified: Secondary | ICD-10-CM

## 2022-05-14 DIAGNOSIS — M1990 Unspecified osteoarthritis, unspecified site: Secondary | ICD-10-CM | POA: Diagnosis not present

## 2022-05-14 DIAGNOSIS — Q2112 Patent foramen ovale: Secondary | ICD-10-CM | POA: Diagnosis not present

## 2022-05-14 DIAGNOSIS — R0602 Shortness of breath: Secondary | ICD-10-CM | POA: Diagnosis not present

## 2022-05-14 DIAGNOSIS — I340 Nonrheumatic mitral (valve) insufficiency: Secondary | ICD-10-CM | POA: Diagnosis not present

## 2022-05-14 DIAGNOSIS — Z86711 Personal history of pulmonary embolism: Secondary | ICD-10-CM | POA: Insufficient documentation

## 2022-05-14 DIAGNOSIS — Z95818 Presence of other cardiac implants and grafts: Secondary | ICD-10-CM

## 2022-05-14 DIAGNOSIS — I081 Rheumatic disorders of both mitral and tricuspid valves: Secondary | ICD-10-CM | POA: Diagnosis not present

## 2022-05-14 DIAGNOSIS — I10 Essential (primary) hypertension: Secondary | ICD-10-CM | POA: Diagnosis not present

## 2022-05-14 HISTORY — PX: TEE WITHOUT CARDIOVERSION: SHX5443

## 2022-05-14 LAB — ECHO TEE
MV M vel: 5.06 m/s
MV Peak grad: 102.4 mmHg
Radius: 1.7 cm

## 2022-05-14 SURGERY — ECHOCARDIOGRAM, TRANSESOPHAGEAL
Anesthesia: Monitor Anesthesia Care

## 2022-05-14 MED ORDER — LIDOCAINE 2% (20 MG/ML) 5 ML SYRINGE
INTRAMUSCULAR | Status: DC | PRN
  Administered 2022-05-14: 40 mg via INTRAVENOUS
  Administered 2022-05-14: 10 mg via INTRAVENOUS

## 2022-05-14 MED ORDER — SODIUM CHLORIDE 0.9 % IV SOLN: INTRAVENOUS | Status: DC

## 2022-05-14 MED ORDER — PROPOFOL 10 MG/ML IV BOLUS
INTRAVENOUS | Status: DC | PRN
  Administered 2022-05-14 (×2): 10 mg via INTRAVENOUS

## 2022-05-14 MED ORDER — PROPOFOL 500 MG/50ML IV EMUL
INTRAVENOUS | Status: DC | PRN
  Administered 2022-05-14: 100 ug/kg/min via INTRAVENOUS

## 2022-05-14 MED ORDER — LACTATED RINGERS IV SOLN
INTRAVENOUS | Status: AC | PRN
  Administered 2022-05-14: 20 mL/h via INTRAVENOUS

## 2022-05-14 NOTE — Progress Notes (Signed)
  Echocardiogram Echocardiogram Transesophageal has been performed.  Bobbye Charleston 05/14/2022, 1:10 PM

## 2022-05-14 NOTE — Anesthesia Procedure Notes (Signed)
Procedure Name: MAC Date/Time: 05/14/2022 12:09 PM  Performed by: Dorthea Cove, CRNAPre-anesthesia Checklist: Patient identified, Emergency Drugs available, Suction available, Patient being monitored and Timeout performed Patient Re-evaluated:Patient Re-evaluated prior to induction Oxygen Delivery Method: Nasal cannula Preoxygenation: Pre-oxygenation with 100% oxygen Induction Type: IV induction Placement Confirmation: positive ETCO2 and CO2 detector Dental Injury: Teeth and Oropharynx as per pre-operative assessment

## 2022-05-14 NOTE — Anesthesia Preprocedure Evaluation (Signed)
Anesthesia Evaluation  Patient identified by MRN, date of birth, ID band Patient awake    Reviewed: Allergy & Precautions, NPO status , Patient's Chart, lab work & pertinent test results  Airway Mallampati: II  TM Distance: >3 FB Neck ROM: Full    Dental no notable dental hx.    Pulmonary asthma , COPD,  COPD inhaler,    Pulmonary exam normal        Cardiovascular hypertension, Pt. on medications + Valvular Problems/Murmurs MR  Rhythm:Regular Rate:Normal + Systolic murmurs    Neuro/Psych Dementia negative neurological ROS     GI/Hepatic Neg liver ROS, GERD  Medicated,  Endo/Other  negative endocrine ROS  Renal/GU   negative genitourinary   Musculoskeletal  (+) Arthritis , Osteoarthritis,    Abdominal Normal abdominal exam  (+)   Peds  Hematology negative hematology ROS (+)   Anesthesia Other Findings  IMPRESSIONS    1. Left ventricular ejection fraction, by estimation, is 65 to 70%. The  left ventricle has normal function. The left ventricle has no regional  wall motion abnormalities. There is mild left ventricular hypertrophy.  Left ventricular diastolic parameters  are consistent with Grade II diastolic dysfunction (pseudonormalization).  Elevated left atrial pressure.  2. Right ventricular systolic function is normal. The right ventricular  size is normal. There is normal pulmonary artery systolic pressure.  3. Left atrial size was severely dilated.  4. PFO is present as seen by color doppler wiht L to R flow. . present.  5. S/p MtriaClip with 2 XTW clipis (A2/P2 and A3/P3) MR is very eccentric  directed posteriorly along wall of LV It is probably moderate in severity.  Compared to echo from February 16, 2022, MR appears more prominent . The  mitral valve has been  repaired/replaced. There is moderate prolapse of both leaflets of the  mitral valve. Procedure Date: 02/15/2022.  6. The aortic valve is  tricuspid. Aortic valve regurgitation is not  visualized. Aortic valve sclerosis is present, with no evidence of aortic  valve stenosis.  7. The inferior vena cava is normal in size with greater than 50%  respiratory variability, suggesting right atrial pressure of 3 mmHg.   FINDINGS  Reproductive/Obstetrics                             Anesthesia Physical  Anesthesia Plan  ASA: 3  Anesthesia Plan: MAC   Post-op Pain Management: Minimal or no pain anticipated   Induction: Intravenous  PONV Risk Score and Plan: 2 and Treatment may vary due to age or medical condition, Propofol infusion and TIVA  Airway Management Planned: Mask and Natural Airway  Additional Equipment: None and TEE  Intra-op Plan:   Post-operative Plan:   Informed Consent: I have reviewed the patients History and Physical, chart, labs and discussed the procedure including the risks, benefits and alternatives for the proposed anesthesia with the patient or authorized representative who has indicated his/her understanding and acceptance.     Dental advisory given  Plan Discussed with: CRNA  Anesthesia Plan Comments: ( )        Anesthesia Quick Evaluation

## 2022-05-14 NOTE — Transfer of Care (Signed)
Immediate Anesthesia Transfer of Care Note  Patient: Crystal Bass  Procedure(s) Performed: TRANSESOPHAGEAL ECHOCARDIOGRAM (TEE)  Patient Location: Endoscopy Unit  Anesthesia Type:MAC  Level of Consciousness: awake and drowsy  Airway & Oxygen Therapy: Patient Spontanous Breathing and Patient connected to nasal cannula oxygen  Post-op Assessment: Report given to RN and Post -op Vital signs reviewed and stable  Post vital signs: Reviewed and stable  Last Vitals:  Vitals Value Taken Time  BP 157/61 05/14/22 1245  Temp 36.3 C 05/14/22 1245  Pulse 68 05/14/22 1247  Resp 24 05/14/22 1247  SpO2 99 % 05/14/22 1247  Vitals shown include unvalidated device data.  Last Pain:  Vitals:   05/14/22 1245  TempSrc: Temporal  PainSc: 0-No pain         Complications: No notable events documented.

## 2022-05-14 NOTE — Discharge Instructions (Signed)

## 2022-05-14 NOTE — CV Procedure (Signed)
    TRANSESOPHAGEAL ECHOCARDIOGRAM   NAME:  Crystal Bass    MRN: [id number] DOB:  1941-02-25    ADMIT DATE: 05/14/2022  INDICATIONS: Severe MR  PROCEDURE:   Informed consent was obtained prior to the procedure. The risks, benefits and alternatives for the procedure were discussed and the patient comprehended these risks.  Risks include, but are not limited to, cough, sore throat, vomiting, nausea, somnolence, esophageal and stomach trauma or perforation, bleeding, low blood pressure, aspiration, pneumonia, infection, trauma to the teeth and death.    Procedural time out performed. The oropharynx was anesthetized with topical 1% benzocaine.    Anesthesia was administered by Dr. Jillyn Hidden.  The patient was administered 150 mg of propofol and 50 mg of lidocaine to achieve and maintain moderate conscious sedation.  The patient's heart rate, blood pressure, and oxygen saturation are monitored continuously during the procedure. The period of conscious sedation is 35 minutes, of which I was present face-to-face 100% of this time.   The transesophageal probe was inserted in the esophagus and stomach without difficulty and multiple views were obtained.   COMPLICATIONS:    There were no immediate complications.  KEY FINDINGS:  Severe recurrent MR. Secondary to new flail A1 segment.  Normal LV/RV function.  Full report to follow. Further management per primary team.   [place] T. Audie Box, MD, [place]  [address] [place], [place] [address] [phone]  12:40 PM

## 2022-05-14 NOTE — Anesthesia Postprocedure Evaluation (Signed)
Anesthesia Post Note  Patient: Crystal Bass  Procedure(s) Performed: TRANSESOPHAGEAL ECHOCARDIOGRAM (TEE)     Patient location during evaluation: Endoscopy Anesthesia Type: MAC Level of consciousness: awake Pain management: pain level controlled Vital Signs Assessment: post-procedure vital signs reviewed and stable Respiratory status: spontaneous breathing Cardiovascular status: stable Postop Assessment: no apparent nausea or vomiting Anesthetic complications: no   No notable events documented.  Last Vitals:  Vitals:   05/14/22 1255 05/14/22 1305  BP: (!) 162/71 (!) 172/74  Pulse: 70 71  Resp: (!) 22 (!) 29  Temp:    SpO2: 97% 96%    Last Pain:  Vitals:   05/14/22 1305  TempSrc:   PainSc: 0-No pain                 Huston Foley

## 2022-05-14 NOTE — Interval H&P Note (Signed)
History and Physical Interval Note:  05/14/2022 11:51 AM  Crystal Bass  has presented today for surgery, with the diagnosis of severe MR.  The various methods of treatment have been discussed with the patient and family. After consideration of risks, benefits and other options for treatment, the patient has consented to  Procedure(s): TRANSESOPHAGEAL ECHOCARDIOGRAM (TEE) (N/A) as a surgical intervention.  The patient's history has been reviewed, patient examined, no change in status, stable for surgery.  I have reviewed the patient's chart and labs.  Questions were answered to the patient's satisfaction.     NPO for TEE for MR s/p mitraclip.   [place] T. Audie Box, MD, [place]  [address] [place], [place] [address] [phone]  11:51 AM

## 2022-05-15 MED ORDER — PANTOPRAZOLE SODIUM 40 MG PO TBEC: 40.0000 mg | DELAYED_RELEASE_TABLET | Freq: Every day | ORAL | 2 refills | Status: DC

## 2022-05-15 NOTE — Addendum Note (Signed)
Addended by: Eileen Stanford on: 05/15/2022 09:18 AM   Modules accepted: Orders

## 2022-05-17 ENCOUNTER — Encounter: Payer: Medicare PPO | Admitting: *Deleted

## 2022-05-17 DIAGNOSIS — Z9889 Other specified postprocedural states: Secondary | ICD-10-CM | POA: Diagnosis not present

## 2022-05-17 NOTE — Progress Notes (Signed)
Daily Session Note  Patient Details  Name: Crystal Bass MRN: [id number] Date of Birth: [birth year] Referring Provider:   Flowsheet Row Cardiac Rehab from 03/21/2022 in [facility] Cardiac and Pulmonary Rehab  Referring Provider [name] MD       Encounter Date: 05/17/2022  Check In:  Session Check In - 05/17/22 1407       Check-In   Supervising physician immediately available to respond to emergencies See telemetry face sheet for immediately available ER MD    Location [facility]-Cardiac & Pulmonary Rehab    Staff Present Heath Lark, RN, BSN, CCRP;Kristen Coble, RN,BC,MSN;Joseph [place], [place]    Virtual Visit No    Medication changes reported     No    Fall or balance concerns reported    No    Warm-up and Cool-down Performed on first and last piece of equipment    Resistance Training Performed Yes    VAD Patient? No    PAD/SET Patient? No      Pain Assessment   Currently in Pain? No/denies                Social History   Tobacco Use  Smoking Status Never  Smokeless Tobacco Never    Goals Met:  Independence with exercise equipment Exercise tolerated well No report of concerns or symptoms today  Goals Unmet:  Not Applicable  Comments: Pt able to follow exercise prescription today without complaint.  Will continue to monitor for progression.    Dr. Emily Filbert is Medical Director for [place].  Dr. Ottie Glazier is Medical Director for [facility] Pulmonary Rehabilitation.

## 2022-05-21 ENCOUNTER — Other Ambulatory Visit: Payer: Self-pay

## 2022-05-21 DIAGNOSIS — Z9889 Other specified postprocedural states: Secondary | ICD-10-CM | POA: Diagnosis not present

## 2022-05-23 DIAGNOSIS — Z9889 Other specified postprocedural states: Secondary | ICD-10-CM | POA: Diagnosis not present

## 2022-05-23 NOTE — Progress Notes (Signed)
Daily Session Note  Patient Details  Name: Crystal Bass MRN: [id number] Date of Birth: [birth year] Referring Provider:   Flowsheet Row Cardiac Rehab from 03/21/2022 in [facility] Cardiac and Pulmonary Rehab  Referring Provider [name] MD       Encounter Date: 05/23/2022  Check In:  Session Check In - 05/23/22 1336       Check-In   Supervising physician immediately available to respond to emergencies See telemetry face sheet for immediately available ER MD    Location [facility]-Cardiac & Pulmonary Rehab    Staff Present Justin Mend, RCP,RRT,BSRT;Venezia Sargeant [place], MPA, Nino Glow, MS, ASCM CEP, Exercise Physiologist;Noah Tickle, BS, Exercise Physiologist    Virtual Visit No    Medication changes reported     No    Fall or balance concerns reported    No    Tobacco Cessation No Change    Warm-up and Cool-down Performed on first and last piece of equipment    Resistance Training Performed Yes    VAD Patient? No    PAD/SET Patient? No      Pain Assessment   Currently in Pain? No/denies                Social History   Tobacco Use  Smoking Status Never  Smokeless Tobacco Never    Goals Met:  Independence with exercise equipment Exercise tolerated well No report of concerns or symptoms today Strength training completed today  Goals Unmet:  Not Applicable  Comments: Pt able to follow exercise prescription today without complaint.  Will continue to monitor for progression.    Dr. Emily Filbert is Medical Director for [place].  Dr. Ottie Glazier is Medical Director for [facility] Pulmonary Rehabilitation.

## 2022-05-24 DIAGNOSIS — Z9889 Other specified postprocedural states: Secondary | ICD-10-CM | POA: Diagnosis not present

## 2022-05-24 NOTE — Progress Notes (Signed)
Daily Session Note  Patient Details  Name: Crystal Bass MRN: [id number] Date of Birth: 09-Apr-1941 Referring Provider:   Flowsheet Row Cardiac Rehab from 03/21/2022 in [facility] Cardiac and Pulmonary Rehab  Referring Provider [name] MD       Encounter Date: 05/24/2022  Check In:  Session Check In - 05/24/22 1332       Check-In   Supervising physician immediately available to respond to emergencies See telemetry face sheet for immediately available ER MD    Location [facility]-Cardiac & Pulmonary Rehab    Staff Present Antionette Fairy, BS, Exercise Physiologist;Jessica [place], MA, RCEP, CCRP, CCET;Susanne Bice, RN, BSN, CCRP;Joseph [place], RCP,RRT,BSRT    Virtual Visit No    Medication changes reported     No    Fall or balance concerns reported    No    Warm-up and Cool-down Performed on first and last piece of equipment    Resistance Training Performed Yes    VAD Patient? No    PAD/SET Patient? No      Pain Assessment   Currently in Pain? No/denies                Social History   Tobacco Use  Smoking Status Never  Smokeless Tobacco Never    Goals Met:  Independence with exercise equipment Exercise tolerated well No report of concerns or symptoms today  Goals Unmet:  Not Applicable  Comments: Pt able to follow exercise prescription today without complaint.  Will continue to monitor for progression.    Dr. Emily Filbert is Medical Director for [place].  Dr. Ottie Glazier is Medical Director for [facility] Pulmonary Rehabilitation.

## 2022-05-28 ENCOUNTER — Encounter: Payer: Medicare PPO | Attending: Cardiovascular Disease | Admitting: *Deleted

## 2022-05-28 DIAGNOSIS — Z9889 Other specified postprocedural states: Secondary | ICD-10-CM | POA: Diagnosis not present

## 2022-05-28 DIAGNOSIS — Z95818 Presence of other cardiac implants and grafts: Secondary | ICD-10-CM | POA: Diagnosis not present

## 2022-05-28 DIAGNOSIS — I340 Nonrheumatic mitral (valve) insufficiency: Secondary | ICD-10-CM | POA: Diagnosis not present

## 2022-05-28 NOTE — Progress Notes (Signed)
Daily Session Note  Patient Details  Name: Crystal Bass MRN: [id number] Date of Birth: December 08, 1940 Referring Provider:   Flowsheet Row Cardiac Rehab from 03/21/2022 in [facility] Cardiac and Pulmonary Rehab  Referring Provider [name] MD       Encounter Date: 05/28/2022  Check In:  Session Check In - 05/28/22 1352       Check-In   Supervising physician immediately available to respond to emergencies See telemetry face sheet for immediately available ER MD    Location [facility]-Cardiac & Pulmonary Rehab    Staff Present Nyoka Cowden, RN, BSN, Lauretta Grill, RCP,RRT,BSRT;Melissa Wallace, [place], Tawanna Solo, MS, ASCM CEP, Exercise Physiologist    Virtual Visit No    Medication changes reported     No    Fall or balance concerns reported    No    Tobacco Cessation No Change    Warm-up and Cool-down Performed on first and last piece of equipment    Resistance Training Performed Yes    VAD Patient? No    PAD/SET Patient? No      Pain Assessment   Currently in Pain? No/denies                Social History   Tobacco Use  Smoking Status Never  Smokeless Tobacco Never    Goals Met:  Independence with exercise equipment Exercise tolerated well No report of concerns or symptoms today  Goals Unmet:  Not Applicable  Comments: Pt able to follow exercise prescription today without complaint.  Will continue to monitor for progression.    Dr. Emily Filbert is Medical Director for [place].  Dr. Ottie Glazier is Medical Director for [facility] Pulmonary Rehabilitation.

## 2022-05-30 DIAGNOSIS — I340 Nonrheumatic mitral (valve) insufficiency: Secondary | ICD-10-CM | POA: Diagnosis not present

## 2022-05-30 DIAGNOSIS — Z95818 Presence of other cardiac implants and grafts: Secondary | ICD-10-CM | POA: Diagnosis not present

## 2022-05-30 DIAGNOSIS — Z9889 Other specified postprocedural states: Secondary | ICD-10-CM

## 2022-05-30 NOTE — Progress Notes (Signed)
Daily Session Note  Patient Details  Name: ODESSA NISHI MRN: [id number] Date of Birth: Nov 19, 1941 Referring Provider:   Flowsheet Row Cardiac Rehab from 03/21/2022 in [facility] Cardiac and Pulmonary Rehab  Referring Provider [name] MD       Encounter Date: 05/30/2022  Check In:  Session Check In - 05/30/22 1326       Check-In   Supervising physician immediately available to respond to emergencies See telemetry face sheet for immediately available ER MD    Location [facility]-Cardiac & Pulmonary Rehab    Staff Present Antionette Fairy, BS, Exercise Physiologist;Kameko Hukill Rosalia Hammers, MPA, RN;Joseph [place], [place];Heath Lark, RN, BSN, CCRP    Virtual Visit No    Medication changes reported     No    Fall or balance concerns reported    No    Tobacco Cessation No Change    Warm-up and Cool-down Performed on first and last piece of equipment    Resistance Training Performed Yes    VAD Patient? No    PAD/SET Patient? No      Pain Assessment   Currently in Pain? No/denies                Social History   Tobacco Use  Smoking Status Never  Smokeless Tobacco Never    Goals Met:  Independence with exercise equipment Exercise tolerated well No report of concerns or symptoms today Strength training completed today  Goals Unmet:  Not Applicable  Comments: Pt able to follow exercise prescription today without complaint.  Will continue to monitor for progression.    Dr. Emily Filbert is Medical Director for [place].  Dr. Ottie Glazier is Medical Director for [facility] Pulmonary Rehabilitation.

## 2022-05-31 ENCOUNTER — Ambulatory Visit (INDEPENDENT_AMBULATORY_CARE_PROVIDER_SITE_OTHER): Payer: Medicare PPO | Admitting: Obstetrics & Gynecology

## 2022-05-31 ENCOUNTER — Encounter: Payer: Medicare PPO | Admitting: *Deleted

## 2022-05-31 ENCOUNTER — Encounter (HOSPITAL_BASED_OUTPATIENT_CLINIC_OR_DEPARTMENT_OTHER): Payer: Self-pay | Admitting: Obstetrics & Gynecology

## 2022-05-31 VITALS — BP 145/83 | HR 72 | Ht 62.0 in | Wt 120.4 lb

## 2022-05-31 DIAGNOSIS — Z9889 Other specified postprocedural states: Secondary | ICD-10-CM

## 2022-05-31 DIAGNOSIS — I340 Nonrheumatic mitral (valve) insufficiency: Secondary | ICD-10-CM | POA: Diagnosis not present

## 2022-05-31 DIAGNOSIS — Z95818 Presence of other cardiac implants and grafts: Secondary | ICD-10-CM | POA: Diagnosis not present

## 2022-05-31 DIAGNOSIS — Z853 Personal history of malignant neoplasm of breast: Secondary | ICD-10-CM | POA: Diagnosis not present

## 2022-05-31 NOTE — Progress Notes (Signed)
Daily Session Note  Patient Details  Name: Crystal Bass MRN: [id number] Date of Birth: 06/04/1941 Referring Provider:   Flowsheet Row Cardiac Rehab from 03/21/2022 in [facility] Cardiac and Pulmonary Rehab  Referring Provider Arida, Muhammad MD       Encounter Date: 05/31/2022  Check In:  Session Check In - 05/31/22 1338       Check-In   Supervising physician immediately available to respond to emergencies See telemetry face sheet for immediately available ER MD    Location [facility]-Cardiac & Pulmonary Rehab    Staff Present Joseph [place], RCP,RRT,BSRT;Jessica [place], MA, RCEP, CCRP, CCET;Noah Tickle, BS, Exercise Physiologist;Melissa Caiola, RDN, LDN; , RN, BSN, CCRP    Virtual Visit No    Medication changes reported     No    Fall or balance concerns reported    No    Tobacco Cessation No Change    Warm-up and Cool-down Performed on first and last piece of equipment    Resistance Training Performed Yes    VAD Patient? No    PAD/SET Patient? No      Pain Assessment   Currently in Pain? No/denies    Multiple Pain Sites No                Social History   Tobacco Use  Smoking Status Never  Smokeless Tobacco Never    Goals Met:  Independence with exercise equipment Exercise tolerated well No report of concerns or symptoms today  Goals Unmet:  Not Applicable  Comments: Pt able to follow exercise prescription today without complaint.  Will continue to monitor for progression.    Dr. Mark Miller is Medical Director for [facility] Cardiac Rehabilitation.  Dr. Fuad Aleskerov is Medical Director for [facility] Pulmonary Rehabilitation. 

## 2022-06-03 NOTE — Progress Notes (Signed)
GYNECOLOGY  VISIT  CC:   breast mass  HPI: 81 y.o. G0P0000 Widowed White or Caucasian female here for concerns about possible right breast mass.  Pt has medical hx significant for breast cancer in 2002 and then again in 2013.  Both of these were treated with lumpectomy.  Pt has Parkinson's as well as COPD.  Has significant SOB on days like today when it is hot and humid.  Recently, underwent mitral valve leaflet clip 02/15/2022 and has been on cardiac rehab.  Is almost done with this and very glad to done.    Is not completely sure she is feeling a breast mass.  Has lost some weight and breast tissue is different.  Desires my opinion.  Last mammogram was 06/15/2021.  Denies nipple discharge.  No recent breast trauma.   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) 09/16/2012   Dyspnea    Endometriosis    GERD (gastroesophageal reflux disease)    Hypercalcemia 09/14/2013   Hypertension    Lesion of breast 1992   right, benign   Lung disease    secondary to [name] infection   Mastalgia 05/1994   Osteoporosis, post-menopausal 03/14/2012   Ovarian tumor of borderline malignancy, right 2004   Pancreatitis    secondary to Cholelithiasis   Parkinson disease ([place]) 02/10/2015   Post-thoracotomy pain syndrome    Pseudomonas pneumonia ([place]) 03/2008   Status post implantation of mitral valve leaflet clip 02/15/2022   s/p XTW MitraClip x2 by Dr. Burt Knack in the A2/P2 position   Traumatic closed fracture of distal clavicle with minimal displacement, left, initial encounter 06/2021   [facility]   Uterine fibroid    Vitamin D deficiency     MEDS:    Current Outpatient Medications on File Prior to Visit  Medication Sig Dispense Refill   acetaminophen (TYLENOL) 500 MG tablet Take 2 tablets (1,000 mg total) by mouth 2 (two) times daily as needed.     albuterol (VENTOLIN HFA) 108 (90 Base) MCG/ACT inhaler Inhale 2 puffs into the lungs every 6 (six) hours as needed for wheezing or shortness of breath. 18 g 1   amLODipine (NORVASC) 2.5 MG tablet TAKE 1 TABLET BY MOUTH ONCE A DAY 30 tablet 3   amoxicillin (AMOXIL) 500 MG capsule Take 4 capsules (2,000 mg total) by mouth as directed. Take 4 tablets 1 hour prior to dental work, including cleanings. 12 capsule 12   aspirin 81 MG tablet Take 1 tablet (81 mg total) by mouth daily. 30 tablet    benzocaine-menthol (CHLORASEPTIC) 6-10 MG lozenge Take 1 lozenge by mouth as needed for sore throat. 100 tablet 0   Carbidopa-Levodopa ER (SINEMET CR) 25-100 MG tablet controlled release TAKE 2 TABLET BY MOUTH AT 9 AM, 1 TAB AT1 PM AND 1 TAB AT 5 PM 360 tablet 1   clonazePAM (KLONOPIN) 0.5 MG tablet TAKE 1/2 A TABLET BY MOUTH AT BEDTIME 45 tablet 1   EPINEPHrine 0.3 mg/0.3 mL IJ SOAJ injection Inject 0.3 mg into the muscle as needed for anaphylaxis.     fexofenadine (  ALLEGRA) 180 MG tablet Take 180 mg by mouth daily.     Fluticasone-Umeclidin-Vilant (TRELEGY ELLIPTA) 100-62.5-25 MCG/ACT AEPB Inhale 1 puff into the lungs daily. 1 each 6   furosemide (LASIX) 20 MG tablet Take 1 tablet (20 mg total) by mouth daily. 90 tablet 3   gabapentin ([name]) 300 MG capsule TAKE 2 CAPSULES BY MOUTH 3 TIMES DAILY 540 capsule 1   pantoprazole (PROTONIX) 40 MG tablet Take 1 tablet (40 mg total) by mouth daily. 90 tablet 2   No current facility-administered medications on file prior to visit.    ALLERGIES: Sulfa antibiotics, Sulfonamide derivatives, Topamax [topiramate], Biaxin [clarithromycin], Hydrocodone, Motrin [ibuprofen], Symbicort [budesonide-formoterol fumarate], Percocet [oxycodone-acetaminophen], Tape, and  Tetracyclines & related  SH:  widowed, non smoker  Review of Systems  Genitourinary: Negative.     PHYSICAL EXAMINATION:    BP (!) 145/83 (BP Location: Right Arm, Patient Position: Sitting, Cuff Size: Normal)   Pulse 72   Ht '5\' 2"'$  (1.575 m) Comment: reported  Wt 120 lb 6.4 oz (54.6 kg)   BMI 22.02 kg/m     General appearance: alert, cooperative and appears stated age Neck: no adenopathy, supple, symmetrical, trachea midline and thyroid normal to inspection and palpation Breasts: normal appearance, no masses or tenderness Lymph:  no axillary LAD noted  Assessment/Plan: 1. Personal history of breast cancer - normal exam today.  As pt is not having pain, feel it is fine to wait until routine mammogram is due later in July and proceed with imaging at that time.

## 2022-06-04 ENCOUNTER — Encounter: Payer: Self-pay | Admitting: Family Medicine

## 2022-06-04 ENCOUNTER — Ambulatory Visit: Payer: Medicare PPO | Admitting: Family Medicine

## 2022-06-04 VITALS — Ht 62.0 in | Wt 121.0 lb

## 2022-06-04 VITALS — BP 126/74 | HR 68 | Temp 97.2°F | Ht 62.0 in | Wt 120.2 lb

## 2022-06-04 DIAGNOSIS — G20 Parkinson's disease: Secondary | ICD-10-CM

## 2022-06-04 DIAGNOSIS — Z95818 Presence of other cardiac implants and grafts: Secondary | ICD-10-CM | POA: Diagnosis not present

## 2022-06-04 DIAGNOSIS — Z9889 Other specified postprocedural states: Secondary | ICD-10-CM

## 2022-06-04 DIAGNOSIS — R0609 Other forms of dyspnea: Secondary | ICD-10-CM

## 2022-06-04 DIAGNOSIS — I059 Rheumatic mitral valve disease, unspecified: Secondary | ICD-10-CM | POA: Diagnosis not present

## 2022-06-04 DIAGNOSIS — J449 Chronic obstructive pulmonary disease, unspecified: Secondary | ICD-10-CM

## 2022-06-04 DIAGNOSIS — R634 Abnormal weight loss: Secondary | ICD-10-CM | POA: Diagnosis not present

## 2022-06-04 DIAGNOSIS — I10 Essential (primary) hypertension: Secondary | ICD-10-CM

## 2022-06-04 DIAGNOSIS — I340 Nonrheumatic mitral (valve) insufficiency: Secondary | ICD-10-CM | POA: Diagnosis not present

## 2022-06-04 MED ORDER — GABAPENTIN 300 MG PO CAPS: ORAL_CAPSULE | ORAL | 1 refills | Status: DC

## 2022-06-04 MED ORDER — ALBUTEROL SULFATE HFA 108 (90 BASE) MCG/ACT IN AERS: 2.0000 | INHALATION_SPRAY | Freq: Four times a day (QID) | RESPIRATORY_TRACT | 1 refills | Status: DC | PRN

## 2022-06-04 NOTE — Assessment & Plan Note (Signed)
This has stabilized.

## 2022-06-04 NOTE — Assessment & Plan Note (Addendum)
Having difficulty with inhalation needed for Trelegy.  Discussed option of return to brextri with spacer vs changing to nebulizers - she will retry Trelegy at home, contact pulm to inquire about options.  Albuterol inhaler refilled - she has spacer.

## 2022-06-04 NOTE — Progress Notes (Signed)
Patient ID: Crystal Bass, female    DOB: Jul 30, 1941, 81 y.o.   MRN: [id number]  This visit was conducted in person.  BP 126/74   Pulse 68   Temp (!) 97.2 F (36.2 C) (Temporal)   Ht '5\' 2"'$  (1.575 m)   Wt 120 lb 4 oz (54.5 kg)   SpO2 95%   BMI 21.99 kg/m    CC: f/u visit  Subjective:   HPI: Crystal Bass is a 81 y.o. female presenting on 06/04/2022 for Medication Refill   Recent mitral valve repair with MitraClip in March 2023 for severe MR and MVP.  Initial improvement in symptoms, then exertional dyspnea recurred.  Repeat echo showed recurrent moderate mitral regurg.  Repeat TEE last month to see if she would be a candidate for another clip, her case was sent to [facility] for second opinion. Continues undergoing cardiac rehab, planning to finish end of July. . Maintaining weight.  Parkinson's followed by Dr. Carles Collet.  Continues clonazepam 0.5 mg half pill nightly at bedtime for REM behavior disorder.  Ongoing easy exertional dyspnea after a few steps. Occasional sharp substernal pin pricks. She feels well on low dose amlodipine - no orthostatic symptoms with this.   Saw pulmonology for ongoing dyspnea, concern for atypical mycobacterial infection-no benefit noted from [facility], felt Trelegy was somewhat better  Requests albuterol inhaler refill. She is taking Trelegy daily.     Relevant past medical, surgical, family and social history reviewed and updated as indicated. Interim medical history since our last visit reviewed. Allergies and medications reviewed and updated. Outpatient Medications Prior to Visit  Medication Sig Dispense Refill   acetaminophen (TYLENOL) 500 MG tablet Take 2 tablets (1,000 mg total) by mouth 2 (two) times daily as needed.     amLODipine (NORVASC) 2.5 MG tablet TAKE 1 TABLET BY MOUTH ONCE A DAY 30 tablet 3   amoxicillin (AMOXIL) 500 MG capsule Take 4 capsules (2,000 mg total) by mouth as directed. Take 4 tablets 1 hour prior to dental work,  including cleanings. 12 capsule 12   aspirin 81 MG tablet Take 1 tablet (81 mg total) by mouth daily. 30 tablet    benzocaine-menthol (CHLORASEPTIC) 6-10 MG lozenge Take 1 lozenge by mouth as needed for sore throat. 100 tablet 0   Carbidopa-Levodopa ER (SINEMET CR) 25-100 MG tablet controlled release TAKE 2 TABLET BY MOUTH AT 9 AM, 1 TAB AT1 PM AND 1 TAB AT 5 PM 360 tablet 1   clonazePAM (KLONOPIN) 0.5 MG tablet TAKE 1/2 A TABLET BY MOUTH AT BEDTIME 45 tablet 1   EPINEPHrine 0.3 mg/0.3 mL IJ SOAJ injection Inject 0.3 mg into the muscle as needed for anaphylaxis.     fexofenadine (ALLEGRA) 180 MG tablet Take 180 mg by mouth daily.     Fluticasone-Umeclidin-Vilant (TRELEGY ELLIPTA) 100-62.5-25 MCG/ACT AEPB Inhale 1 puff into the lungs daily. 1 each 6   furosemide (LASIX) 20 MG tablet Take 1 tablet (20 mg total) by mouth daily. 90 tablet 3   pantoprazole (PROTONIX) 40 MG tablet Take 1 tablet (40 mg total) by mouth daily. 90 tablet 2   albuterol (VENTOLIN HFA) 108 (90 Base) MCG/ACT inhaler Inhale 2 puffs into the lungs every 6 (six) hours as needed for wheezing or shortness of breath. 18 g 1   gabapentin ([name]) 300 MG capsule TAKE 2 CAPSULES BY MOUTH 3 TIMES DAILY 540 capsule 1   No facility-administered medications prior to visit.     Per HPI unless specifically  indicated in ROS section below Review of Systems  Objective:  BP 126/74   Pulse 68   Temp (!) 97.2 F (36.2 C) (Temporal)   Ht '5\' 2"'$  (1.575 m)   Wt 120 lb 4 oz (54.5 kg)   SpO2 95%   BMI 21.99 kg/m   Wt Readings from Last 3 Encounters:  06/04/22 120 lb 4 oz (54.5 kg)  05/31/22 120 lb 6.4 oz (54.6 kg)  05/14/22 117 lb (53.1 kg)      Physical Exam Vitals and nursing note reviewed.  Constitutional:      Appearance: Normal appearance. She is not ill-appearing.  HENT:     Mouth/Throat:     Mouth: Mucous membranes are moist.     Pharynx: Oropharynx is clear. No oropharyngeal exudate or posterior oropharyngeal erythema.   Eyes:     Extraocular Movements: Extraocular movements intact.     Pupils: Pupils are equal, round, and reactive to light.  Cardiovascular:     Rate and Rhythm: Normal rate and regular rhythm.     Pulses: Normal pulses.     Heart sounds: Murmur (4/6 systolic at apex) heard.  Pulmonary:     Effort: No respiratory distress.     Breath sounds: No wheezing, rhonchi or rales.  Musculoskeletal:     Right lower leg: Edema (tr) present.     Left lower leg: Edema (tr) present.  Skin:    General: Skin is warm and dry.     Findings: No rash.  Neurological:     Mental Status: She is alert.  Psychiatric:        Mood and Affect: Mood normal.        Behavior: Behavior normal.       Results for orders placed or performed during the hospital encounter of 05/14/22  ECHO TEE  Result Value Ref Range   MV M vel 5.06 m/s   MV Peak grad 102.4 mmHg   Radius 1.70 cm   *Note: Due to a large number of results and/or encounters for the requested time period, some results have not been displayed. A complete set of results can be found in Results Review.    Assessment & Plan:   Problem List Items Addressed This Visit     Essential hypertension    Stable period on low dose amlodipine - continue.       Chronic obstructive airway disease with asthma ([place])    Having difficulty with inhalation needed for Trelegy.  Discussed option of return to brextri with spacer vs changing to nebulizers - she will retry Trelegy at home, contact pulm to inquire about options.  Albuterol inhaler refilled - she has spacer.       Relevant Medications   albuterol (VENTOLIN HFA) 108 (90 Base) MCG/ACT inhaler   Chronic dyspnea - Primary    Thought cardiac in nature - continues cardiac rehab.       Parkinson disease ([place])    Appreciate neurology care ([place])      Relevant Medications   gabapentin ([name]) 300 MG capsule   Mitral valve disease    Recurrent mod MR after MitraClip       Weight loss,  unintentional    This has stabilized.       S/P mitral valve clip implantation    Pending second opinion in [place] - she will contact cards for an update.         Meds ordered this encounter  Medications   albuterol (VENTOLIN HFA) 108 (  90 Base) MCG/ACT inhaler    Sig: Inhale 2 puffs into the lungs every 6 (six) hours as needed for wheezing or shortness of breath.    Dispense:  18 g    Refill:  1   gabapentin ([name]) 300 MG capsule    Sig: TAKE 2 CAPSULES BY MOUTH 3 TIMES DAILY    Dispense:  540 capsule    Refill:  1   No orders of the defined types were placed in this encounter.   Patient Instructions  Touch base with cardiology in 1-2 wks to see if they've heard about 2nd opinion on mitral valve surgery/options.  Return in 2-3 months (after Sept 13th) for physical.  Good to see you today.  Follow up plan: Return in about 3 months (around 09/04/2022) for medicare wellness visit, annual exam, prior fasting for blood work.  [name], MD

## 2022-06-04 NOTE — Assessment & Plan Note (Signed)
Stable period on low dose amlodipine - continue.

## 2022-06-04 NOTE — Progress Notes (Signed)
Daily Session Note  Patient Details  Name: Crystal Bass MRN: [id number] Date of Birth: 11-24-1941 Referring Provider:   Flowsheet Row Cardiac Rehab from 03/21/2022 in [facility]  Referring Provider [name] MD       Encounter Date: 06/04/2022  Check In:  Session Check In - 06/04/22 1348       Check-In   Supervising physician immediately available to respond to emergencies See telemetry face sheet for immediately available ER MD    Location [facility]-Cardiac & Pulmonary Rehab    Staff Present Carson Myrtle, BS, RRT, CPFT;Joseph Karie Fetch, MPA, RN    Virtual Visit No    Medication changes reported     No    Fall or balance concerns reported    No    Tobacco Cessation No Change    Warm-up and Cool-down Performed on first and last piece of equipment    Resistance Training Performed Yes    VAD Patient? No    PAD/SET Patient? No      Pain Assessment   Currently in Pain? No/denies                Social History   Tobacco Use  Smoking Status Never  Smokeless Tobacco Never    Goals Met:  Independence with exercise equipment Exercise tolerated well No report of concerns or symptoms today Strength training completed today  Goals Unmet:  Not Applicable  Comments:   6 Minute Walk     Row Name 03/21/22 1504 06/04/22 1359       6 Minute Walk   Phase Initial Discharge    Distance 800 feet 1100 feet    Distance % Change -- 38 %    Distance Feet Change -- 300 ft    Walk Time 6 minutes 6 minutes    # of Rest Breaks 0 0    MPH 1.51 2.08    METS 1.61 1.85    RPE 16 13    Perceived Dyspnea  3 3    VO2 Peak 5.64 6.49    Symptoms Yes (comment) No    Comments SOB, fatigue --    Resting HR 67 bpm 68 bpm    Resting BP 110/64 104/60    Resting Oxygen Saturation  97 % 94 %    Exercise Oxygen Saturation  during 6 min walk 91 % 92 %    Max Ex. HR 86 bpm 82 bpm    Max Ex. BP 128/70 122/58    2 Minute Post BP 114/62  112/60      Interval HR   1 Minute HR 79 --    2 Minute HR 81 --    3 Minute HR 84 --    4 Minute HR 84 --    5 Minute HR 85 --    6 Minute HR 86 --    Interval Heart Rate? Yes  Monitored as office notes mentioned SOB with exertion --      Interval Oxygen   Interval Oxygen? Yes --    Baseline Oxygen Saturation % 97 % --    1 Minute Oxygen Saturation % 97 % --    1 Minute Liters of Oxygen 0 L  Room Air --    2 Minute Oxygen Saturation % 94 % --    2 Minute Liters of Oxygen 0 L --    3 Minute Oxygen Saturation % 98 % --    3 Minute Liters of Oxygen  0 L --    4 Minute Oxygen Saturation % 95 % --    4 Minute Liters of Oxygen 0 L --    5 Minute Oxygen Saturation % 92 % --    5 Minute Liters of Oxygen 0 L --    6 Minute Oxygen Saturation % 91 % --    6 Minute Liters of Oxygen 0 L --    2 Minute Post Oxygen Saturation % 98 % --    2 Minute Post Liters of Oxygen 0 L --            Pt able to follow exercise prescription today without complaint.  Will continue to monitor for progression.    Dr. Emily Filbert is Medical Director for [facility].  Dr. Ottie Glazier is Medical Director for [facility] Pulmonary Rehabilitation.

## 2022-06-04 NOTE — Assessment & Plan Note (Signed)
Recurrent mod MR after MitraClip

## 2022-06-04 NOTE — Patient Instructions (Signed)
Touch base with cardiology in 1-2 wks to see if they've heard about 2nd opinion on mitral valve surgery/options.  Return in 2-3 months (after Sept 13th) for physical.  Good to see you today.

## 2022-06-04 NOTE — Assessment & Plan Note (Signed)
Thought cardiac in nature - continues cardiac rehab.

## 2022-06-04 NOTE — Assessment & Plan Note (Signed)
Appreciate neurology care ([place])

## 2022-06-04 NOTE — Assessment & Plan Note (Signed)
Pending second opinion in [place] - she will contact cards for an update.

## 2022-06-05 ENCOUNTER — Telehealth: Payer: Self-pay

## 2022-06-05 NOTE — Patient Instructions (Signed)
Discharge Patient Instructions  Patient Details  Name: Crystal Bass MRN: [id number] Date of Birth: [birth year] Referring Provider:  [name], MD   Number of Visits: 48  Reason for Discharge:  Patient reached a stable level of exercise. Patient independent in their exercise. Patient has met program and personal goals.  Smoking History:  Social History   Tobacco Use  Smoking Status Never  Smokeless Tobacco Never    Diagnosis:  S/P mitral valve repair  Initial Exercise Prescription:  Initial Exercise Prescription - 03/21/22 1500       Date of Initial Exercise RX and Referring Provider   Date 03/21/22    Referring Provider [name] MD      Oxygen   Maintain Oxygen Saturation 88% or higher      Recumbant Bike   Level 1    RPM 50    Watts 10    Minutes 15    METs 1.5      T5 Nustep   Level 1    SPM 80    Minutes 15    METs 1.5      Biostep-RELP   Level 1    SPM 50    Minutes 15    METs 2      Track   Laps 21    Minutes 15    METs 2.14      Prescription Details   Frequency (times per week) 3    Duration Progress to 30 minutes of continuous aerobic without signs/symptoms of physical distress      Intensity   THRR 40-80% of Max Heartrate 97-126    Ratings of Perceived Exertion 11-13    Perceived Dyspnea 0-4      Progression   Progression Continue to progress workloads to maintain intensity without signs/symptoms of physical distress.      Resistance Training   Training Prescription Yes    Weight 2 lb    Reps 10-15             Discharge Exercise Prescription (Final Exercise Prescription Changes):  Exercise Prescription Changes - 05/28/22 1300       Response to Exercise   Blood Pressure (Admit) 112/60    Blood Pressure (Exit) 126/62    Heart Rate (Admit) 62 bpm    Heart Rate (Exercise) 90 bpm    Heart Rate (Exit) 68 bpm    Rating of Perceived Exertion (Exercise) 15    Symptoms none    Duration Continue with 30 min  of aerobic exercise without signs/symptoms of physical distress.    Intensity THRR unchanged      Progression   Progression Continue to progress workloads to maintain intensity without signs/symptoms of physical distress.    Average METs 1.64      Resistance Training   Training Prescription Yes    Weight 2 lb    Reps 10-15      Interval Training   Interval Training No      NuStep   Level 3    Minutes 15    METs 1.6      T5 Nustep   Level 1    Minutes 15    METs 1.5      Track   Laps 18    Minutes 15    METs 1.98      Home Exercise Plan   Plans to continue exercise at [employer] (comment)   [employer]   Frequency Add 2 additional days to program exercise  sessions.    Initial Home Exercises Provided 04/18/22      Oxygen   Maintain Oxygen Saturation 88% or higher             Functional Capacity:  6 Minute Walk     Row Name 03/21/22 1504 06/04/22 1359       6 Minute Walk   Phase Initial Discharge    Distance 800 feet 1100 feet    Distance % Change -- 38 %    Distance Feet Change -- 300 ft    Walk Time 6 minutes 6 minutes    # of Rest Breaks 0 0    MPH 1.51 2.08    METS 1.61 1.85    RPE 16 13    Perceived Dyspnea  3 3    VO2 Peak 5.64 6.49    Symptoms Yes (comment) No    Comments SOB, fatigue --    Resting HR 67 bpm 68 bpm    Resting BP 110/64 104/60    Resting Oxygen Saturation  97 % 94 %    Exercise Oxygen Saturation  during 6 min walk 91 % 92 %    Max Ex. HR 86 bpm 82 bpm    Max Ex. BP 128/70 122/58    2 Minute Post BP 114/62 112/60      Interval HR   1 Minute HR 79 --    2 Minute HR 81 --    3 Minute HR 84 --    4 Minute HR 84 --    5 Minute HR 85 --    6 Minute HR 86 --    Interval Heart Rate? Yes  Monitored as office notes mentioned SOB with exertion --      Interval Oxygen   Interval Oxygen? Yes --    Baseline Oxygen Saturation % 97 % --    1 Minute Oxygen Saturation % 97 % --    1 Minute Liters of Oxygen 0 L  Room Air  --    2 Minute Oxygen Saturation % 94 % --    2 Minute Liters of Oxygen 0 L --    3 Minute Oxygen Saturation % 98 % --    3 Minute Liters of Oxygen 0 L --    4 Minute Oxygen Saturation % 95 % --    4 Minute Liters of Oxygen 0 L --    5 Minute Oxygen Saturation % 92 % --    5 Minute Liters of Oxygen 0 L --    6 Minute Oxygen Saturation % 91 % --    6 Minute Liters of Oxygen 0 L --    2 Minute Post Oxygen Saturation % 98 % --    2 Minute Post Liters of Oxygen 0 L --            Nutrition & Weight - Outcomes:   Post Biometrics - 06/04/22 1403        Post  Biometrics   Height _0  (1.575 m)    Weight 121 lb (54.9 kg)    BMI (Calculated) 22.13    Single Leg Stand 10 seconds

## 2022-06-05 NOTE — Telephone Encounter (Signed)
Per Drs. Burt Knack and [place], called patient to discuss potential treatment plan.   Per Dr. Burt Knack: Looking at this again, understanding how symptomatic the patient is. Gradient is 6 mmHg now, partly driven by MR. I think we should considering trying this but it will likely be difficult. NTW Clip lateral to the other 2 clips and accept the fact that the gradient will likely be elevated. Arun when you have time can you take a look and let me know what you think? I can see her back to discuss another procedure if we agree that it's reasonable to attempt it.  ----- Message -----   Dr. Ali Lowe agreed with Dr. Burt Knack that it is worth attempting another Clip to alleviate symptoms.   If the patient is willing, will arrange office visit with Dr. Burt Knack next week and tentatively arrange MitraClip 7/25.  Left message for patient to call back.

## 2022-06-06 ENCOUNTER — Other Ambulatory Visit: Payer: Self-pay

## 2022-06-06 ENCOUNTER — Telehealth: Payer: Self-pay

## 2022-06-06 ENCOUNTER — Encounter: Payer: Self-pay | Admitting: *Deleted

## 2022-06-06 DIAGNOSIS — I340 Nonrheumatic mitral (valve) insufficiency: Secondary | ICD-10-CM

## 2022-06-06 DIAGNOSIS — Z9889 Other specified postprocedural states: Secondary | ICD-10-CM

## 2022-06-06 DIAGNOSIS — Z95818 Presence of other cardiac implants and grafts: Secondary | ICD-10-CM | POA: Diagnosis not present

## 2022-06-06 NOTE — Progress Notes (Signed)
Daily Session Note  Patient Details  Name: Crystal Bass MRN: [id number] Date of Birth: [birth year] Referring Provider:   Flowsheet Row Cardiac Rehab from 03/21/2022 in [facility] Cardiac and Pulmonary Rehab  Referring Provider [name] MD       Encounter Date: 06/06/2022  Check In:  Session Check In - 06/06/22 1324       Check-In   Supervising physician immediately available to respond to emergencies See telemetry face sheet for immediately available ER MD    Location [facility]-Cardiac & Pulmonary Rehab    Staff Present Birdie Sons, MPA, RN;Melissa Dunnell, RDN, LDN;Joseph Warren, [place]    Virtual Visit No    Medication changes reported     No    Fall or balance concerns reported    No    Tobacco Cessation No Change    Warm-up and Cool-down Performed on first and last piece of equipment    Resistance Training Performed Yes    VAD Patient? No    PAD/SET Patient? No      Pain Assessment   Currently in Pain? No/denies                Social History   Tobacco Use  Smoking Status Never  Smokeless Tobacco Never    Goals Met:  Independence with exercise equipment Exercise tolerated well No report of concerns or symptoms today Strength training completed today  Goals Unmet:  Not Applicable  Comments: Pt able to follow exercise prescription today without complaint.  Will continue to monitor for progression.    Dr. Emily Filbert is Medical Director for [facility].  Dr. Ottie Glazier is Medical Director for [facility] Pulmonary Rehabilitation.

## 2022-06-06 NOTE — Telephone Encounter (Signed)
Discussed with both Dr. Burt Knack and Dr. Ali Lowe.  Given how symptomatic the patient is, both MDs think it is reasonable to consider/attempt another Clip (NTW Clip lateral to the other 2 clips and accept the fact that the gradient will likely be elevated). Gradient is 6 mmHg now, partly driven by MR.   Spoke with the patient in detail. She understands the implanters have reviewed her case and are willing to attempt another Clip if she would like. Scheduled her 7/21 to discuss with Dr. Burt Knack.  Made tentative plan with the patient for another Clip. If she and Dr. Burt Knack agree to proceed at the 7/21 visit, she will go directly from Dr. Antionette Char office to her PAT appointment at [facility] at 1000. Her Clip will tentatively be scheduled 7/25. She understands if they decide not to proceed, PAT and procedure will be cancelled. She was grateful for call and agrees with plan.

## 2022-06-06 NOTE — Progress Notes (Signed)
Cardiac Individual Treatment Plan  Patient Details  Name: Crystal Bass MRN: [id number] Date of Birth: Jan 06, 1941 Referring Provider:   Flowsheet Row Cardiac Rehab from 03/21/2022 in [facility] Cardiac and Pulmonary Rehab  Referring Provider [name] MD       Initial Encounter Date:  Flowsheet Row Cardiac Rehab from 03/21/2022 in [facility] Cardiac and Pulmonary Rehab  Date 03/21/22       Visit Diagnosis: S/P mitral valve repair  Patient's Home Medications on Admission:  Current Outpatient Medications:    acetaminophen (TYLENOL) 500 MG tablet, Take 2 tablets (1,000 mg total) by mouth 2 (two) times daily as needed., Disp: , Rfl:    albuterol (VENTOLIN HFA) 108 (90 Base) MCG/ACT inhaler, Inhale 2 puffs into the lungs every 6 (six) hours as needed for wheezing or shortness of breath., Disp: 18 g, Rfl: 1   amLODipine (NORVASC) 2.5 MG tablet, TAKE 1 TABLET BY MOUTH ONCE A DAY, Disp: 30 tablet, Rfl: 3   amoxicillin (AMOXIL) 500 MG capsule, Take 4 capsules (2,000 mg total) by mouth as directed. Take 4 tablets 1 hour prior to dental work, including cleanings., Disp: 12 capsule, Rfl: 12   aspirin 81 MG tablet, Take 1 tablet (81 mg total) by mouth daily., Disp: 30 tablet, Rfl:    benzocaine-menthol (CHLORASEPTIC) 6-10 MG lozenge, Take 1 lozenge by mouth as needed for sore throat., Disp: 100 tablet, Rfl: 0   Carbidopa-Levodopa ER (SINEMET CR) 25-100 MG tablet controlled release, TAKE 2 TABLET BY MOUTH AT 9 AM, 1 TAB AT1 PM AND 1 TAB AT 5 PM, Disp: 360 tablet, Rfl: 1   clonazePAM (KLONOPIN) 0.5 MG tablet, TAKE 1/2 A TABLET BY MOUTH AT BEDTIME, Disp: 45 tablet, Rfl: 1   EPINEPHrine 0.3 mg/0.3 mL IJ SOAJ injection, Inject 0.3 mg into the muscle as needed for anaphylaxis., Disp: , Rfl:    fexofenadine (ALLEGRA) 180 MG tablet, Take 180 mg by mouth daily., Disp: , Rfl:    Fluticasone-Umeclidin-Vilant (TRELEGY ELLIPTA) 100-62.5-25 MCG/ACT AEPB, Inhale 1 puff into the lungs daily., Disp: 1 each, Rfl: 6    furosemide (LASIX) 20 MG tablet, Take 1 tablet (20 mg total) by mouth daily., Disp: 90 tablet, Rfl: 3   gabapentin ([name]) 300 MG capsule, TAKE 2 CAPSULES BY MOUTH 3 TIMES DAILY, Disp: 540 capsule, Rfl: 1   pantoprazole (PROTONIX) 40 MG tablet, Take 1 tablet (40 mg total) by mouth daily., Disp: 90 tablet, Rfl: 2  Past Medical History: Past Medical History:  Diagnosis Date   Aneurysm of aorta ([place])    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   Anginal pain ([place])    Aortic root aneurysm ([facility])    Arthritis    Asthma    Breast cancer ([place]) 2002   infiltrative ductal carcinoma    Cataract 2019   COPD (chronic obstructive pulmonary disease) ([place]) 07/2007   Ductal carcinoma of breast, estrogen receptor positive, stage 1 ([place]) 09/16/2012   Dyspnea    Endometriosis    GERD (gastroesophageal reflux disease)    Hypercalcemia 09/14/2013   Hypertension    Lesion of breast 1992   right, benign   Lung disease    secondary to [name] infection   Mastalgia 05/1994   Osteoporosis, post-menopausal 03/14/2012   Ovarian tumor of borderline malignancy, right 2004   Pancreatitis    secondary to Cholelithiasis   Parkinson disease ([place]) 02/10/2015   Post-thoracotomy pain syndrome    Pseudomonas pneumonia ([place]) 03/2008   Status post implantation of mitral  valve leaflet clip 02/15/2022   s/p XTW MitraClip x2 by Dr. Burt Knack in the A2/P2 position   Traumatic closed fracture of distal clavicle with minimal displacement, left, initial encounter 06/2021   [facility]   Uterine fibroid    Vitamin D deficiency     Tobacco Use: Social History   Tobacco Use  Smoking Status Never  Smokeless Tobacco Never    Labs: Review Flowsheet  More data exists      Latest Ref Rng & Units 01/14/2018 01/19/2019 01/22/2020 08/08/2021 02/12/2022  Labs for ITP Cardiac and Pulmonary Rehab  Cholestrol 0 - 200 mg/dL 172  181  176  171  -  LDL (calc) 0 - 99 mg/dL 95  101  102  97  -  HDL-C >39.00 mg/dL 44.90  57.40  48.50   53.30  -  Trlycerides 0.0 - 149.0 mg/dL 161.0  115.0  126.0  100.0  -  Hemoglobin A1c 4.6 - 6.5 % 5.3  - - - -  PH, Arterial 7.35 - 7.45 - - - - 7.43   PCO2 arterial 32 - 48 mmHg - - - - 40   Bicarbonate 20.0 - 28.0 mmol/L - - - - 26.5   O2 Saturation % - - - - 98.7      Exercise Target Goals: Exercise Program Goal: Individual exercise prescription set using results from initial 6 min walk test and THRR while considering  patient's activity barriers and safety.   Exercise Prescription Goal: Initial exercise prescription builds to 30-45 minutes a day of aerobic activity, 2-3 days per week.  Home exercise guidelines will be given to patient during program as part of exercise prescription that the participant will acknowledge.   Education: Aerobic Exercise: - Group verbal and visual presentation on the components of exercise prescription. Introduces F.I.T.T principle from ACSM for exercise prescriptions.  Reviews F.I.T.T. principles of aerobic exercise including progression. Written material given at graduation. Flowsheet Row Cardiac Rehab from 05/30/2022 in [facility] Cardiac and Pulmonary Rehab  Education need identified 03/21/22  Date 05/30/22  Educator [place]  Instruction Review Code 1- Verbalizes Understanding       Education: Resistance Exercise: - Group verbal and visual presentation on the components of exercise prescription. Introduces F.I.T.T principle from ACSM for exercise prescriptions  Reviews F.I.T.T. principles of resistance exercise including progression. Written material given at graduation.    Education: Exercise & Equipment Safety: - Individual verbal instruction and demonstration of equipment use and safety with use of the equipment. Flowsheet Row Cardiac Rehab from 05/30/2022 in [facility] Cardiac and Pulmonary Rehab  Date 03/21/22  Educator [facility]  Instruction Review Code 1- Verbalizes Understanding       Education: Exercise Physiology & General Exercise Guidelines: - Group  verbal and written instruction with models to review the exercise physiology of the cardiovascular system and associated critical values. Provides general exercise guidelines with specific guidelines to those with heart or lung disease.  Flowsheet Row Cardiac Rehab from 05/30/2022 in [facility] Cardiac and Pulmonary Rehab  Date 05/23/22  Educator [place]  Instruction Review Code 1- [employer] Understanding       Education: Flexibility, Balance, Mind/Body Relaxation: - Group verbal and visual presentation with interactive activity on the components of exercise prescription. Introduces F.I.T.T principle from ACSM for exercise prescriptions. Reviews F.I.T.T. principles of flexibility and balance exercise training including progression. Also discusses the mind body connection.  Reviews various relaxation techniques to help reduce and manage stress (i.e. Deep breathing, progressive muscle relaxation, and visualization). Balance handout  provided to take home. Written material given at graduation. Flowsheet Row Cardiac Rehab from 05/30/2022 in [facility] Cardiac and Pulmonary Rehab  Date 04/11/22  Educator [place]  Instruction Review Code 1- Verbalizes Understanding       Activity Barriers & Risk Stratification:  Activity Barriers & Cardiac Risk Stratification - 03/21/22 1506       Activity Barriers & Cardiac Risk Stratification   Activity Barriers Shortness of Breath;Neck/Spine Problems;Joint Problems;Other (comment);Deconditioning;Muscular Weakness;Balance Concerns;History of Falls;Arthritis;Back Problems    Comments Parkinsons    Cardiac Risk Stratification Moderate             6 Minute Walk:  6 Minute Walk     Row Name 03/21/22 1504 06/04/22 1359       6 Minute Walk   Phase Initial Discharge    Distance 800 feet 1100 feet    Distance % Change -- 38 %    Distance Feet Change -- 300 ft    Walk Time 6 minutes 6 minutes    # of Rest Breaks 0 0    MPH 1.51 2.08    METS 1.61 1.85    RPE 16 13     Perceived Dyspnea  3 3    VO2 Peak 5.64 6.49    Symptoms Yes (comment) No    Comments SOB, fatigue --    Resting HR 67 bpm 68 bpm    Resting BP 110/64 104/60    Resting Oxygen Saturation  97 % 94 %    Exercise Oxygen Saturation  during 6 min walk 91 % 92 %    Max Ex. HR 86 bpm 82 bpm    Max Ex. BP 128/70 122/58    2 Minute Post BP 114/62 112/60      Interval HR   1 Minute HR 79 --    2 Minute HR 81 --    3 Minute HR 84 --    4 Minute HR 84 --    5 Minute HR 85 --    6 Minute HR 86 --    Interval Heart Rate? Yes  Monitored as office notes mentioned SOB with exertion --      Interval Oxygen   Interval Oxygen? Yes --    Baseline Oxygen Saturation % 97 % --    1 Minute Oxygen Saturation % 97 % --    1 Minute Liters of Oxygen 0 L  Room Air --    2 Minute Oxygen Saturation % 94 % --    2 Minute Liters of Oxygen 0 L --    3 Minute Oxygen Saturation % 98 % --    3 Minute Liters of Oxygen 0 L --    4 Minute Oxygen Saturation % 95 % --    4 Minute Liters of Oxygen 0 L --    5 Minute Oxygen Saturation % 92 % --    5 Minute Liters of Oxygen 0 L --    6 Minute Oxygen Saturation % 91 % --    6 Minute Liters of Oxygen 0 L --    2 Minute Post Oxygen Saturation % 98 % --    2 Minute Post Liters of Oxygen 0 L --             Oxygen Initial Assessment:   Oxygen Re-Evaluation:   Oxygen Discharge (Final Oxygen Re-Evaluation):   Initial Exercise Prescription:  Initial Exercise Prescription - 03/21/22 1500       Date of Initial Exercise RX  and Referring Provider   Date 03/21/22    Referring Provider [name] MD      Oxygen   Maintain Oxygen Saturation 88% or higher      Recumbant Bike   Level 1    RPM 50    Watts 10    Minutes 15    METs 1.5      T5 Nustep   Level 1    SPM 80    Minutes 15    METs 1.5      Biostep-RELP   Level 1    SPM 50    Minutes 15    METs 2      Track   Laps 21    Minutes 15    METs 2.14      Prescription Details    Frequency (times per week) 3    Duration Progress to 30 minutes of continuous aerobic without signs/symptoms of physical distress      Intensity   THRR 40-80% of Max Heartrate 97-126    Ratings of Perceived Exertion 11-13    Perceived Dyspnea 0-4      Progression   Progression Continue to progress workloads to maintain intensity without signs/symptoms of physical distress.      Resistance Training   Training Prescription Yes    Weight 2 lb    Reps 10-15             Perform Capillary Blood Glucose checks as needed.  Exercise Prescription Changes:   Exercise Prescription Changes     Row Name 03/21/22 1500 04/02/22 0800 04/17/22 1600 04/18/22 1500 04/30/22 1400     Response to Exercise   Blood Pressure (Admit) 110/64 122/64 122/74 -- 132/70   Blood Pressure (Exercise) 128/70 130/64 134/58 -- 126/70   Blood Pressure (Exit) 114/62 120/66 138/68 -- 118/62   Heart Rate (Admit) 67 bpm 82 bpm 75 bpm -- 78 bpm   Heart Rate (Exercise) 86 bpm 88 bpm 104 bpm -- 85 bpm   Heart Rate (Exit) 72 bpm 74 bpm 83 bpm -- 73 bpm   Oxygen Saturation (Admit) 97 % -- -- -- --   Oxygen Saturation (Exercise) 91 % -- -- -- --   Oxygen Saturation (Exit) 98 % -- -- -- --   Rating of Perceived Exertion (Exercise) _0 -- 13   Perceived Dyspnea (Exercise) 3 -- -- -- --   Symptoms SOB, fatigue fatigue fatigue -- none   Comments walk test results first full day of exercise -- -- --   Duration -- Progress to 30 minutes of  aerobic without signs/symptoms of physical distress Progress to 30 minutes of  aerobic without signs/symptoms of physical distress -- Continue with 30 min of aerobic exercise without signs/symptoms of physical distress.   Intensity -- THRR unchanged THRR unchanged -- THRR unchanged     Progression   Progression -- Continue to progress workloads to maintain intensity without signs/symptoms of physical distress. Continue to progress workloads to maintain intensity without  signs/symptoms of physical distress. -- Continue to progress workloads to maintain intensity without signs/symptoms of physical distress.   Average METs -- 2.03 1.92 -- 1.9     Resistance Training   Training Prescription -- Yes Yes -- Yes   Weight -- 2 lb 2 lb -- 2 lb   Reps -- 10-15 10-15 -- 10-15     Interval Training   Interval Training -- No No -- No     Recumbant Bike  Level -- 1 -- -- --   Watts -- 11 -- -- --   Minutes -- 15 -- -- --   METs -- 2.57 -- -- --     NuStep   Level -- -- -- -- 3   Minutes -- -- -- -- 15   METs -- -- -- -- 1.9     T5 Nustep   Level -- 1 1 -- 1   Minutes -- 15 15 -- 15   METs -- 1.5 1.8 -- 1.5     Biostep-RELP   Level -- -- 1 -- 2   Minutes -- -- 15 -- 15   METs -- -- 2 -- 2     Track   Laps -- -- 29 -- 22   Minutes -- -- 15 -- 15   METs -- -- 2.58 -- 2.2     Home Exercise Plan   Plans to continue exercise at -- -- -- [employer] (comment)  [employer] (comment)  [employer]   Frequency -- -- -- Add 2 additional days to program exercise sessions. Add 2 additional days to program exercise sessions.   Initial Home Exercises Provided -- -- -- 04/18/22 04/18/22     Oxygen   Maintain Oxygen Saturation -- 88% or higher 88% or higher -- 88% or higher    Row Name 05/15/22 1600 05/28/22 1300           Response to Exercise   Blood Pressure (Admit) 110/64 112/60      Blood Pressure (Exercise) 142/70 --      Blood Pressure (Exit) 118/60 126/62      Heart Rate (Admit) 65 bpm 62 bpm      Heart Rate (Exercise) 83 bpm 90 bpm      Heart Rate (Exit) 70 bpm 68 bpm      Rating of Perceived Exertion (Exercise) 15 15      Symptoms none none      Duration Continue with 30 min of aerobic exercise without signs/symptoms of physical distress. Continue with 30 min of aerobic exercise without signs/symptoms of physical distress.      Intensity THRR unchanged THRR unchanged        Progression   Progression Continue to  progress workloads to maintain intensity without signs/symptoms of physical distress. Continue to progress workloads to maintain intensity without signs/symptoms of physical distress.      Average METs 1.87 1.64        Resistance Training   Training Prescription Yes Yes      Weight 2 lb 2 lb      Reps 10-15 10-15        Interval Training   Interval Training No No        NuStep   Level 3 3      Minutes 15 15      METs 1.8 1.6        T5 Nustep   Level -- 1      Minutes -- 15      METs -- 1.5        Track   Laps 21 18      Minutes 15 15      METs 2.14 1.98        Home Exercise Plan   Plans to continue exercise at [employer] (comment)  [place] (comment)  [employer]      Frequency Add 2 additional days to  program exercise sessions. Add 2 additional days to program exercise sessions.      Initial Home Exercises Provided 04/18/22 04/18/22        Oxygen   Maintain Oxygen Saturation 88% or higher 88% or higher               Exercise Comments:   Exercise Comments     Row Name 03/26/22 1331           Exercise Comments First full day of exercise!  Patient was oriented to gym and equipment including functions, settings, policies, and procedures.  Patient's individual exercise prescription and treatment plan were reviewed.  All starting workloads were established based on the results of the 6 minute walk test done at initial orientation visit.  The plan for exercise progression was also introduced and progression will be customized based on patient's performance and goals.                Exercise Goals and Review:   Exercise Goals     Row Name 03/21/22 1508             Exercise Goals   Increase Physical Activity Yes       Intervention Provide advice, education, support and counseling about physical activity/exercise needs.;Develop an individualized exercise prescription for aerobic and resistive training based on initial  evaluation findings, risk stratification, comorbidities and participant's personal goals.       Expected Outcomes Short Term: Attend rehab on a regular basis to increase amount of physical activity.;Long Term: Add in home exercise to make exercise part of routine and to increase amount of physical activity.;Long Term: Exercising regularly at least 3-5 days a week.       Increase Strength and Stamina Yes       Intervention Provide advice, education, support and counseling about physical activity/exercise needs.;Develop an individualized exercise prescription for aerobic and resistive training based on initial evaluation findings, risk stratification, comorbidities and participant's personal goals.       Expected Outcomes Short Term: Increase workloads from initial exercise prescription for resistance, speed, and METs.;Short Term: Perform resistance training exercises routinely during rehab and add in resistance training at home;Long Term: Improve cardiorespiratory fitness, muscular endurance and strength as measured by increased METs and functional capacity (6MWT)       Able to understand and use rate of perceived exertion (RPE) scale Yes       Intervention Provide education and explanation on how to use RPE scale       Expected Outcomes Short Term: Able to use RPE daily in rehab to express subjective intensity level;Long Term:  Able to use RPE to guide intensity level when exercising independently       Able to understand and use Dyspnea scale Yes       Intervention Provide education and explanation on how to use Dyspnea scale       Expected Outcomes Short Term: Able to use Dyspnea scale daily in rehab to express subjective sense of shortness of breath during exertion;Long Term: Able to use Dyspnea scale to guide intensity level when exercising independently       Knowledge and understanding of Target Heart Rate Range (THRR) Yes       Intervention Provide education and explanation of THRR including how  the numbers were predicted and where they are located for reference       Expected Outcomes Short Term: Able to state/look up THRR;Short Term: Able to use daily as guideline for  intensity in rehab;Long Term: Able to use THRR to govern intensity when exercising independently       Able to check pulse independently Yes       Intervention Provide education and demonstration on how to check pulse in carotid and radial arteries.;Review the importance of being able to check your own pulse for safety during independent exercise       Expected Outcomes Short Term: Able to explain why pulse checking is important during independent exercise;Long Term: Able to check pulse independently and accurately       Understanding of Exercise Prescription Yes       Intervention Provide education, explanation, and written materials on patient's individual exercise prescription       Expected Outcomes Short Term: Able to explain program exercise prescription;Long Term: Able to explain home exercise prescription to exercise independently                Exercise Goals Re-Evaluation :  Exercise Goals Re-Evaluation     Row Name 03/26/22 1331 04/02/22 0850 04/17/22 1608 04/18/22 1514 04/30/22 1428     Exercise Goal Re-Evaluation   Exercise Goals Review Increase Physical Activity;Able to understand and use rate of perceived exertion (RPE) scale;Knowledge and understanding of Target Heart Rate Range (THRR);Understanding of Exercise Prescription;Able to understand and use Dyspnea scale;Able to check pulse independently;Increase Strength and Stamina Increase Physical Activity;Increase Strength and Stamina;Understanding of Exercise Prescription Increase Physical Activity;Increase Strength and Stamina;Understanding of Exercise Prescription Increase Physical Activity;Increase Strength and Stamina;Understanding of Exercise Prescription Increase Physical Activity;Increase Strength and Stamina;Understanding of Exercise Prescription    Comments Reviewed RPE and dyspnea scales, THR and program prescription with pt today.  Pt voiced understanding and was given a copy of goals to take home. Crystal Bass has only had one visit thus far.  We attempted a phone call last week to check, but had to leave a message without return call.  We will try again this week if she does not come today. Crystal Bass is coming to rehab consistently. She is off to a good start with being able to complete 29 laps on the track! She hits her highest HR when walking. We will continue to monitor as she progresses. Reviewed home exercise with pt today.  Pt plans to use her gym at her living facility at [facility] for exercise.  She also likes to walk as well. Reviewed THR, pulse, RPE, sign and symptoms, pulse oximetery and when to call 911 or MD.  Also discussed weather considerations and indoor options.  Pt voiced understanding. Crystal Bass is doing well in rehab.  She is up to 22 laps on the track.  She is also on level 3 for the NuStep.  We will continue to monitor his progress.   Expected Outcomes Short: Use RPE daily to regulate intensity. Long: Follow program prescription in THR. Short: Attend rehab regularly Long: Follow program prescription Short: Continue to increase laps on track Long: Continue to increase overall strength and stamina Short: Start checking HR during exercise Long: Continue to exercise independently at home at appropriate prescription Short: Continue to increase workloads Long; Conitnue to improve stamina    Row Name 05/15/22 1622 05/21/22 1405 05/28/22 1328         Exercise Goal Re-Evaluation   Exercise Goals Review Increase Physical Activity;Increase Strength and Stamina;Understanding of Exercise Prescription Increase Physical Activity;Increase Strength and Stamina;Understanding of Exercise Prescription Increase Physical Activity;Increase Strength and Stamina;Understanding of Exercise Prescription     Comments Crystal Bass continues to do well  in rehab. She  has only worked on the track and T4 since last review and workloads remain the same. She would benefit from increasing to 3 lbs for handweights. She is not quite hitting her THR but RPEs are in appropriate range. Will continue to monitor. Crystal Bass reports that she has not been going to the gym since [place] began and she would like to start up again at the gym in [facility]. Crystal Bass is doing well in rehab. She had seen a decline in the number of laps she was getting on the track but she has worked her way back up to 18 laps. She has tolerated level 2 on the T4. She has done well with 2 lb hand weights for resistance training as well. We will continue to monitor Crystal Bass's progress in the program.     Expected Outcomes Short: Increase to 3 lb handweights Long: Continue to increase overall MET level Short: visit gym at [facility] and start going at least 2x/week when not at rehab to begin the habit Long: Continue to increase overall MET level Short: Push for more laps on the track. Long: Continue to improve strength and stamina.              Discharge Exercise Prescription (Final Exercise Prescription Changes):  Exercise Prescription Changes - 05/28/22 1300       Response to Exercise   Blood Pressure (Admit) 112/60    Blood Pressure (Exit) 126/62    Heart Rate (Admit) 62 bpm    Heart Rate (Exercise) 90 bpm    Heart Rate (Exit) 68 bpm    Rating of Perceived Exertion (Exercise) 15    Symptoms none    Duration Continue with 30 min of aerobic exercise without signs/symptoms of physical distress.    Intensity THRR unchanged      Progression   Progression Continue to progress workloads to maintain intensity without signs/symptoms of physical distress.    Average METs 1.64      Resistance Training   Training Prescription Yes    Weight 2 lb    Reps 10-15      Interval Training   Interval Training No      NuStep   Level 3    Minutes 15    METs 1.6      T5 Nustep   Level 1    Minutes 15     METs 1.5      Track   Laps 18    Minutes 15    METs 1.98      Home Exercise Plan   Plans to continue exercise at [employer] (comment)   [employer]   Frequency Add 2 additional days to program exercise sessions.    Initial Home Exercises Provided 04/18/22      Oxygen   Maintain Oxygen Saturation 88% or higher             Nutrition:  Target Goals: Understanding of nutrition guidelines, daily intake of sodium '1500mg'$ , cholesterol '200mg'$ , calories 30% from fat and 7% or less from saturated fats, daily to have 5 or more servings of fruits and vegetables.  Education: All About Nutrition: -Group instruction provided by verbal, written material, interactive activities, discussions, models, and posters to present general guidelines for heart healthy nutrition including fat, fiber, MyPlate, the role of sodium in heart healthy nutrition, utilization of the nutrition label, and utilization of this knowledge for meal planning. Follow up email sent as well. Written material given at  graduation. Flowsheet Row Cardiac Rehab from 05/30/2022 in [facility] Cardiac and Pulmonary Rehab  Education need identified 03/21/22  Date 04/18/22  Educator [facility]  Instruction Review Code 1- Verbalizes Understanding       Biometrics:   Post Biometrics - 06/04/22 1403        Post  Biometrics   Height $Remov'5\' 2"'pxPZxP$  (1.575 m)    Weight 121 lb (54.9 kg)    BMI (Calculated) 22.13    Single Leg Stand 10 seconds             Nutrition Therapy Plan and Nutrition Goals:  Nutrition Therapy & Goals - 05/21/22 1407       Nutrition Therapy   RD appointment deferred Yes             Nutrition Assessments:  MEDIFICTS Score Key: ?70 Need to make dietary changes  40-70 Heart Healthy Diet ? 40 Therapeutic Level Cholesterol Diet  Flowsheet Row Cardiac Rehab from 03/21/2022 in [facility] Cardiac and Pulmonary Rehab  Picture Your Plate Total Score on Admission 67      Picture Your Plate Scores: <10 Unhealthy  dietary pattern with much room for improvement. 41-50 Dietary pattern unlikely to meet recommendations for good health and room for improvement. 51-60 More healthful dietary pattern, with some room for improvement.  >60 Healthy dietary pattern, although there may be some specific behaviors that could be improved.    Nutrition Goals Re-Evaluation:  Nutrition Goals Re-Evaluation     [place] Name 04/18/22 1515 05/21/22 1406           Goals   Nutrition Goal Patient has still declined nutrition at this time. She is attending the group nutrition education class today and said she would consider making an appointment with the RD if she decides to do so. She will let us know what she decides. Pt still reports not wanting to meet with RD at this time, will continue to follow up.               Nutrition Goals Discharge (Final Nutrition Goals Re-Evaluation):  Nutrition Goals Re-Evaluation - 05/21/22 1406       Goals   Nutrition Goal Pt still reports not wanting to meet with RD at this time, will continue to follow up.             Psychosocial: Target Goals: Acknowledge presence or absence of significant depression and/or stress, maximize coping skills, provide positive support system. Participant is able to verbalize types and ability to use techniques and skills needed for reducing stress and depression.   Education: Stress, Anxiety, and Depression - Group verbal and visual presentation to define topics covered.  Reviews how body is impacted by stress, anxiety, and depression.  Also discusses healthy ways to reduce stress and to treat/manage anxiety and depression.  Written material given at graduation.   Education: Sleep Hygiene -Provides group verbal and written instruction about how sleep can affect your health.  Define sleep hygiene, discuss sleep cycles and impact of sleep habits. Review good sleep hygiene tips.    Initial Review & Psychosocial Screening:  Initial Psych Review  & Screening - 03/12/22 1546       Initial Review   Current issues with Current Stress Concerns    Source of Stress Concerns Unable to participate in former interests or hobbies;Unable to perform yard/household activities      [place]? Yes   family     Barriers   Psychosocial  barriers to participate in program There are no identifiable barriers or psychosocial needs.;The patient should benefit from training in stress management and relaxation.      Screening Interventions   Interventions Encouraged to exercise;Provide feedback about the scores to participant;To provide support and resources with identified psychosocial needs    Expected Outcomes Long Term Goal: Stressors or current issues are controlled or eliminated.;Short Term goal: Utilizing psychosocial counselor, staff and physician to assist with identification of specific Stressors or current issues interfering with healing process. Setting desired goal for each stressor or current issue identified.;Long Term goal: The participant improves quality of Life and PHQ9 Scores as seen by post scores and/or verbalization of changes;Short Term goal: Identification and review with participant of any Quality of Life or Depression concerns found by scoring the questionnaire.             Quality of Life Scores:   Quality of Life - 03/21/22 1508       Quality of Life   Select Quality of Life      Quality of Life Scores   Health/Function Pre 12.86 %    Socioeconomic Pre 21.5 %    Psych/Spiritual Pre 20.83 %    Family Pre 27.5 %    GLOBAL Pre 17.63 %            Scores of 19 and below usually indicate a poorer quality of life in these areas.  A difference of  2-3 points is a clinically meaningful difference.  A difference of 2-3 points in the total score of the Quality of Life Index has been associated with significant improvement in overall quality of life, self-image, physical symptoms, and general  health in studies assessing change in quality of life.  PHQ-9: Review Flowsheet  More data exists      05/31/2022 05/09/2022 04/16/2022 03/21/2022 08/08/2021  Depression screen PHQ 2/9  Decreased Interest 0 0 1 2 0  Down, Depressed, Hopeless 0 0 0 1 0  PHQ - 2 Score 0 0 1 3 0  Altered sleeping - 0 3 1 -  Tired, decreased energy - $Remove'1 2 2 'tiVXVYP$ -  Change in appetite - $RemoveBe'1 3 2 'PLLuDUsFf$ -  Feeling bad or failure about yourself  - 0 0 1 -  Trouble concentrating - 0 0 0 -  Moving slowly or fidgety/restless - 0 0 0 -  Suicidal thoughts - 0 0 0 -  PHQ-9 Score - $Remov'2 9 9 'QSNXYd$ -  Difficult doing work/chores - Somewhat difficult Not difficult at all Somewhat difficult -   Interpretation of Total Score  Total Score Depression Severity:  1-4 = Minimal depression, 5-9 = Mild depression, 10-14 = Moderate depression, 15-19 = Moderately severe depression, 20-27 = Severe depression   Psychosocial Evaluation and Intervention:  Psychosocial Evaluation - 03/12/22 1547       Psychosocial Evaluation & Interventions   Interventions Encouraged to exercise with the program and follow exercise prescription    Comments Crystal Bass is coming to cardiac rehab for mitral valve repair. She has noticed a little difference post surgery, but she states her breathing issues come mainly from her COPD. She also was diagnosed with parkinson's in 2016. She lives at [facility] and still drives. She has been trying to walk more, but her breathing has gotten in the way. She is hopeful this program will help with both post op and COPD. She does take Klonopin to help sleep and with the Parkinson nightmares, which she states is managing both  concerns.    Expected Outcomes Short: attend cardiac rehab for education and exercise. Long; develop and maintain positive self care habits.    Continue Psychosocial Services  Follow up required by staff             Psychosocial Re-Evaluation:  Psychosocial Re-Evaluation     Rafael Gonzalez Name 04/16/22 1345 04/18/22 1519  05/21/22 1407         Psychosocial Re-Evaluation   Current issues with Current Stress Concerns Current Sleep Concerns Current Sleep Concerns     Comments Reviewed patient health questionnaire (PHQ-9) with patient for follow up. Previously, patients score indicated signs/symptoms of depression.  Reviewed to see if patient is improving symptom wise while in program.  Score stayed the same. Patient is working towards improving her mental health by exercising. Verlene is doing well mentally. She saw her provider yesterday for her Parkinson's and says her doctor is pleased with how she is doing and she feels she is managing it well. Her COPD is still limiting her breathing but hopes her exercise will make it improve overtime. She still practices PLB when she needs to. She states she feels she sleeps too much and has discussed it with her doctors who said they are not concerned about it. Informed her to say something to her doctor if she feels like it is impairing her lifestyle. She is not on any medications for depression or anxiety and declines any other problems at this time. Crystal Bass is doing well mentally. She still practices PLB when she needs to. She reports reports sleeping well and maybe even too much- she has discussed it with her doctors who said they are not concerned about it. Encouraged her again to say something to her doctor if she feels like it is impairing her lifestyle. She is not on any medications for depression or anxiety and declines any other problems at this time.     Expected Outcomes Short: Continue to work toward an improvement in [place] scores by attending [facility] regularly. Long: Continue to improve stress and depression coping skills by talking with staff and attending [facility] regularly and work toward a positive mental state. Short: Continue to attend rehab regularly Long: Continue to maintain positive attitude Short: Continue to attend rehab regularly Long: Continue to maintain  positive attitude     Interventions Encouraged to attend Cardiac Rehabilitation for the exercise Encouraged to attend Cardiac Rehabilitation for the exercise Encouraged to attend Cardiac Rehabilitation for the exercise     Continue Psychosocial Services  Follow up required by staff Follow up required by staff Follow up required by staff              Psychosocial Discharge (Final Psychosocial Re-Evaluation):  Psychosocial Re-Evaluation - 05/21/22 1407       Psychosocial Re-Evaluation   Current issues with Current Sleep Concerns    Comments Crystal Bass is doing well mentally. She still practices PLB when she needs to. She reports reports sleeping well and maybe even too much- she has discussed it with her doctors who said they are not concerned about it. Encouraged her again to say something to her doctor if she feels like it is impairing her lifestyle. She is not on any medications for depression or anxiety and declines any other problems at this time.    Expected Outcomes Short: Continue to attend rehab regularly Long: Continue to maintain positive attitude    Interventions Encouraged to attend Cardiac Rehabilitation for the exercise    Continue Psychosocial Services  Follow up required by staff             Vocational Rehabilitation: Provide vocational rehab assistance to qualifying candidates.   Vocational Rehab Evaluation & Intervention:  Vocational Rehab - 03/12/22 1538       Initial Vocational Rehab Evaluation & Intervention   Assessment shows need for Vocational Rehabilitation No             Education: Education Goals: Education classes will be provided on a variety of topics geared toward better understanding of heart health and risk factor modification. Participant will state understanding/return demonstration of topics presented as noted by education test scores.  Learning Barriers/Preferences:  Learning Barriers/Preferences - 03/12/22 1536       Learning  Barriers/Preferences   Learning Barriers None    Learning Preferences Individual Instruction             General Cardiac Education Topics:  AED/CPR: - Group verbal and written instruction with the use of models to demonstrate the basic use of the AED with the basic ABC's of resuscitation.   Anatomy and Cardiac Procedures: - Group verbal and visual presentation and models provide information about basic cardiac anatomy and function. Reviews the testing methods done to diagnose heart disease and the outcomes of the test results. Describes the treatment choices: Medical Management, Angioplasty, or Coronary Bypass Surgery for treating various heart conditions including Myocardial Infarction, Angina, Valve Disease, and Cardiac Arrhythmias.  Written material given at graduation. Flowsheet Row Cardiac Rehab from 05/30/2022 in [facility] Cardiac and Pulmonary Rehab  Education need identified 03/21/22       Medication Safety: - Group verbal and visual instruction to review commonly prescribed medications for heart and lung disease. Reviews the medication, class of the drug, and side effects. Includes the steps to properly store meds and maintain the prescription regimen.  Written material given at graduation.   Intimacy: - Group verbal instruction through game format to discuss how heart and lung disease can affect sexual intimacy. Written material given at graduation.. Flowsheet Row Cardiac Rehab from 05/30/2022 in [facility] Cardiac and Pulmonary Rehab  Date 05/30/22  Educator [place]  Instruction Review Code 1- Verbalizes Understanding       Know Your Numbers and Heart Failure: - Group verbal and visual instruction to discuss disease risk factors for cardiac and pulmonary disease and treatment options.  Reviews associated critical values for Overweight/Obesity, Hypertension, Cholesterol, and Diabetes.  Discusses basics of heart failure: signs/symptoms and treatments.  Introduces Heart Failure Zone chart  for action plan for heart failure.  Written material given at graduation. Flowsheet Row Cardiac Rehab from 05/30/2022 in [facility] Cardiac and Pulmonary Rehab  Date 05/02/22  Educator SB  Instruction Review Code 1- Verbalizes Understanding       Infection Prevention: - Provides verbal and written material to individual with discussion of infection control including proper hand washing and proper equipment cleaning during exercise session. Flowsheet Row Cardiac Rehab from 05/30/2022 in [facility] Cardiac and Pulmonary Rehab  Date 03/21/22  Educator [facility]  Instruction Review Code 1- Verbalizes Understanding       Falls Prevention: - Provides verbal and written material to individual with discussion of falls prevention and safety. Flowsheet Row Cardiac Rehab from 05/30/2022 in [facility] Cardiac and Pulmonary Rehab  Date 03/21/22  Educator [facility]  Instruction Review Code 1- Verbalizes Understanding       Other: -Provides group and verbal instruction on various topics (see comments)   Knowledge Questionnaire Score:  Knowledge Questionnaire Score - 03/21/22  1509       Knowledge Questionnaire Score   Pre Score 19/26             Core Components/Risk Factors/Patient Goals at Admission:  Personal Goals and Risk Factors at Admission - 03/21/22 1509       Core Components/Risk Factors/Patient Goals on Admission    Weight Management Yes;Weight Maintenance    Intervention Weight Management: Develop a combined nutrition and exercise program designed to reach desired caloric intake, while maintaining appropriate intake of nutrient and fiber, sodium and fats, and appropriate energy expenditure required for the weight goal.;Weight Management: Provide education and appropriate resources to help participant work on and attain dietary goals.    Admit Weight 122 lb 1.6 oz (55.4 kg)    Goal Weight: Short Term 123 lb (55.8 kg)    Goal Weight: Long Term 123 lb (55.8 kg)    Expected Outcomes Short Term: Continue to  assess and modify interventions until short term weight is achieved;Long Term: Adherence to nutrition and physical activity/exercise program aimed toward attainment of established weight goal;Weight Maintenance: Understanding of the daily nutrition guidelines, which includes 25-35% calories from fat, 7% or less cal from saturated fats, less than $RemoveB'200mg'LyQklcib$  cholesterol, less than 1.5gm of sodium, & 5 or more servings of fruits and vegetables daily    Improve shortness of breath with ADL's Yes    Intervention Provide education, individualized exercise plan and daily activity instruction to help decrease symptoms of SOB with activities of daily living.    Expected Outcomes Short Term: Improve cardiorespiratory fitness to achieve a reduction of symptoms when performing ADLs;Long Term: Be able to perform more ADLs without symptoms or delay the onset of symptoms    Hypertension Yes    Intervention Provide education on lifestyle modifcations including regular physical activity/exercise, weight management, moderate sodium restriction and increased consumption of fresh fruit, vegetables, and low fat dairy, alcohol moderation, and smoking cessation.;Monitor prescription use compliance.    Expected Outcomes Short Term: Continued assessment and intervention until BP is < 140/64mm HG in hypertensive participants. < 130/12mm HG in hypertensive participants with diabetes, heart failure or chronic kidney disease.;Long Term: Maintenance of blood pressure at goal levels.             Education:Diabetes - Individual verbal and written instruction to review signs/symptoms of diabetes, desired ranges of glucose level fasting, after meals and with exercise. Acknowledge that pre and post exercise glucose checks will be done for 3 sessions at entry of program.   Core Components/Risk Factors/Patient Goals Review:   Goals and Risk Factor Review     Row Name 04/18/22 1516 05/21/22 1408           Core Components/Risk  Factors/Patient Goals Review   Personal Goals Review Weight Management/Obesity;Hypertension;Improve shortness of breath with ADL's Weight Management/Obesity;Hypertension;Improve shortness of breath with ADL's      Review Crystal Bass is doing well in rehab. She is checking her weight at home and is trying to at least maintain if not gain some weight. Her lowest she has been is 117 lb and monitor to make sure she does not have any significant weight loss. We talked about using her clothes fitting as a good measurement as well as she could be increasing some muscle mass. She is also checking her BP at home which seems to be stable. She reports 147 systolic and 82-95A diastolic.  Her SOB is very limiting with her daily activites due to her COPD which she does not see  so much of an improvement yet. She enjoys the program and is staying compliant taking all her medications. Crystal Bass is doing well in rehab. She reports her weight has been stable. She is not checking her BP as frequently anymore - she would like to get back into the habit; today her BP was 132/76. Her SOB is very limiting with her daily activites due to her COPD which she does not see so much of an improvement yet- she reports having good days and bad days. She enjoys the program and is staying compliant taking all her medications with no issues.      Expected Outcomes Short: Continue to monitor weight and make note of any abnormal changes Long: Continue to manage lifestyle risk factors Short: Continue to monitor weight and make note of any abnormal changes, get back into routine of checking BP at home Long: Continue to manage lifestyle risk factors               Core Components/Risk Factors/Patient Goals at Discharge (Final Review):   Goals and Risk Factor Review - 05/21/22 1408       Core Components/Risk Factors/Patient Goals Review   Personal Goals Review Weight Management/Obesity;Hypertension;Improve shortness of breath with ADL's    Review  Crystal Bass is doing well in rehab. She reports her weight has been stable. She is not checking her BP as frequently anymore - she would like to get back into the habit; today her BP was 132/76. Her SOB is very limiting with her daily activites due to her COPD which she does not see so much of an improvement yet- she reports having good days and bad days. She enjoys the program and is staying compliant taking all her medications with no issues.    Expected Outcomes Short: Continue to monitor weight and make note of any abnormal changes, get back into routine of checking BP at home Long: Continue to manage lifestyle risk factors             ITP Comments:  ITP Comments     Row Name 03/12/22 1557 03/21/22 1504 03/26/22 1331 04/11/22 0914 05/09/22 1346   ITP Comments Initial telephone orientation completed. Diagnosis can be found in [facility] 3/23. EP orientation scheduled for Wednesday 4/26 at 1:30pm Completed 6MWT and gym orientation. Initial ITP created and sent for review to Dr. Emily Filbert, Medical Director. First full day of exercise!  Patient was oriented to gym and equipment including functions, settings, policies, and procedures.  Patient's individual exercise prescription and treatment plan were reviewed.  All starting workloads were established based on the results of the 6 minute walk test done at initial orientation visit.  The plan for exercise progression was also introduced and progression will be customized based on patient's performance and goals. 30 Day review completed. Medical Director ITP review done, changes made as directed, and signed approval by Medical Director.    NEW 30 Day review completed. Medical Director ITP review done, changes made as directed, and signed approval by Medical Director.    [name] Name 06/06/22 1027           ITP Comments 30 Day review completed. Medical Director ITP review done, changes made as directed, and signed approval by Medical Director.                 Comments:

## 2022-06-06 NOTE — Telephone Encounter (Signed)
Per [employer] review of 05/14/2022 TEE, "For patient BS: This is an evaluation for an additional clip  This valve is suitable for a MitraClip implant. The fossa looks approachable for transseptal puncture in the SAXB and Bicaval views. LA dimensions are large enough for device steering and straddle. The remaining valve area specific to the residual defect measures 2.55-3.0cm2 with a gradient of 5-64mhg at a HR of 64bpm. The posterior leaflet measures 1.0cm in the LVOT grasping view(s). I'd like to verify both MVA and gradient in the case prior to the start. Based on this information, I would plan to place an NT/W on the lateral side of A2/P2 if the gradient allows."

## 2022-06-07 DIAGNOSIS — I340 Nonrheumatic mitral (valve) insufficiency: Secondary | ICD-10-CM | POA: Diagnosis not present

## 2022-06-07 DIAGNOSIS — Z95818 Presence of other cardiac implants and grafts: Secondary | ICD-10-CM | POA: Diagnosis not present

## 2022-06-07 DIAGNOSIS — Z9889 Other specified postprocedural states: Secondary | ICD-10-CM

## 2022-06-07 NOTE — Progress Notes (Signed)
Daily Session Note  Patient Details  Name: Crystal Bass MRN: [id number] Date of Birth: [birth year] Referring Provider:   Flowsheet Row Cardiac Rehab from 03/21/2022 in [facility] Cardiac and Pulmonary Rehab  Referring Provider [name] MD       Encounter Date: 06/07/2022  Check In:  Session Check In - 06/07/22 1332       Check-In   Supervising physician immediately available to respond to emergencies See telemetry face sheet for immediately available ER MD    Location [facility]-Cardiac & Pulmonary Rehab    Staff Present Vida Rigger, RN, BSN;Joseph Eustis, RCP,RRT,BSRT;Jessica Crisfield, [place], RCEP, CCRP, CCET    Virtual Visit No    Medication changes reported     No    Fall or balance concerns reported    No    Tobacco Cessation No Change    Warm-up and Cool-down Performed on first and last piece of equipment    Resistance Training Performed Yes    VAD Patient? No    PAD/SET Patient? No      Pain Assessment   Currently in Pain? No/denies                Social History   Tobacco Use  Smoking Status Never  Smokeless Tobacco Never    Goals Met:  Independence with exercise equipment Exercise tolerated well No report of concerns or symptoms today Strength training completed today  Goals Unmet:  Not Applicable  Comments: Pt able to follow exercise prescription today without complaint.  Will continue to monitor for progression.   Dr. Emily Filbert is Medical Director for [place].  Dr. Ottie Glazier is Medical Director for [facility] Pulmonary Rehabilitation.

## 2022-06-11 ENCOUNTER — Encounter: Payer: Medicare PPO | Admitting: *Deleted

## 2022-06-11 DIAGNOSIS — I340 Nonrheumatic mitral (valve) insufficiency: Secondary | ICD-10-CM | POA: Diagnosis not present

## 2022-06-11 DIAGNOSIS — Z95818 Presence of other cardiac implants and grafts: Secondary | ICD-10-CM | POA: Diagnosis not present

## 2022-06-11 DIAGNOSIS — Z9889 Other specified postprocedural states: Secondary | ICD-10-CM | POA: Diagnosis not present

## 2022-06-11 NOTE — Progress Notes (Signed)
Cardiac Individual Treatment Plan  Patient Details  Name: Crystal Bass MRN: [id number] Date of Birth: Jan 06, 1941 Referring Provider:   Flowsheet Row Cardiac Rehab from 03/21/2022 in [facility] Cardiac and Pulmonary Rehab  Referring Provider [name] MD       Initial Encounter Date:  Flowsheet Row Cardiac Rehab from 03/21/2022 in [facility] Cardiac and Pulmonary Rehab  Date 03/21/22       Visit Diagnosis: S/P mitral valve repair  Patient's Home Medications on Admission:  Current Outpatient Medications:    acetaminophen (TYLENOL) 500 MG tablet, Take 2 tablets (1,000 mg total) by mouth 2 (two) times daily as needed., Disp: , Rfl:    albuterol (VENTOLIN HFA) 108 (90 Base) MCG/ACT inhaler, Inhale 2 puffs into the lungs every 6 (six) hours as needed for wheezing or shortness of breath., Disp: 18 g, Rfl: 1   amLODipine (NORVASC) 2.5 MG tablet, TAKE 1 TABLET BY MOUTH ONCE A DAY, Disp: 30 tablet, Rfl: 3   amoxicillin (AMOXIL) 500 MG capsule, Take 4 capsules (2,000 mg total) by mouth as directed. Take 4 tablets 1 hour prior to dental work, including cleanings., Disp: 12 capsule, Rfl: 12   aspirin 81 MG tablet, Take 1 tablet (81 mg total) by mouth daily., Disp: 30 tablet, Rfl:    benzocaine-menthol (CHLORASEPTIC) 6-10 MG lozenge, Take 1 lozenge by mouth as needed for sore throat., Disp: 100 tablet, Rfl: 0   Carbidopa-Levodopa ER (SINEMET CR) 25-100 MG tablet controlled release, TAKE 2 TABLET BY MOUTH AT 9 AM, 1 TAB AT1 PM AND 1 TAB AT 5 PM, Disp: 360 tablet, Rfl: 1   clonazePAM (KLONOPIN) 0.5 MG tablet, TAKE 1/2 A TABLET BY MOUTH AT BEDTIME, Disp: 45 tablet, Rfl: 1   EPINEPHrine 0.3 mg/0.3 mL IJ SOAJ injection, Inject 0.3 mg into the muscle as needed for anaphylaxis., Disp: , Rfl:    fexofenadine (ALLEGRA) 180 MG tablet, Take 180 mg by mouth daily., Disp: , Rfl:    Fluticasone-Umeclidin-Vilant (TRELEGY ELLIPTA) 100-62.5-25 MCG/ACT AEPB, Inhale 1 puff into the lungs daily., Disp: 1 each, Rfl: 6    furosemide (LASIX) 20 MG tablet, Take 1 tablet (20 mg total) by mouth daily., Disp: 90 tablet, Rfl: 3   gabapentin ([name]) 300 MG capsule, TAKE 2 CAPSULES BY MOUTH 3 TIMES DAILY, Disp: 540 capsule, Rfl: 1   pantoprazole (PROTONIX) 40 MG tablet, Take 1 tablet (40 mg total) by mouth daily., Disp: 90 tablet, Rfl: 2  Past Medical History: Past Medical History:  Diagnosis Date   Aneurysm of aorta ([place])    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   Anginal pain ([place])    Aortic root aneurysm ([facility])    Arthritis    Asthma    Breast cancer ([place]) 2002   infiltrative ductal carcinoma    Cataract 2019   COPD (chronic obstructive pulmonary disease) ([place]) 07/2007   Ductal carcinoma of breast, estrogen receptor positive, stage 1 ([place]) 09/16/2012   Dyspnea    Endometriosis    GERD (gastroesophageal reflux disease)    Hypercalcemia 09/14/2013   Hypertension    Lesion of breast 1992   right, benign   Lung disease    secondary to [name] infection   Mastalgia 05/1994   Osteoporosis, post-menopausal 03/14/2012   Ovarian tumor of borderline malignancy, right 2004   Pancreatitis    secondary to Cholelithiasis   Parkinson disease ([place]) 02/10/2015   Post-thoracotomy pain syndrome    Pseudomonas pneumonia ([place]) 03/2008   Status post implantation of mitral  valve leaflet clip 02/15/2022   s/p XTW MitraClip x2 by Dr. Burt Knack in the A2/P2 position   Traumatic closed fracture of distal clavicle with minimal displacement, left, initial encounter 06/2021   [facility]   Uterine fibroid    Vitamin D deficiency     Tobacco Use: Social History   Tobacco Use  Smoking Status Never  Smokeless Tobacco Never    Labs: Review Flowsheet  More data exists      Latest Ref Rng & Units 01/14/2018 01/19/2019 01/22/2020 08/08/2021 02/12/2022  Labs for ITP Cardiac and Pulmonary Rehab  Cholestrol 0 - 200 mg/dL 172  181  176  171  -  LDL (calc) 0 - 99 mg/dL 95  101  102  97  -  HDL-C >39.00 mg/dL 44.90  57.40  48.50   53.30  -  Trlycerides 0.0 - 149.0 mg/dL 161.0  115.0  126.0  100.0  -  Hemoglobin A1c 4.6 - 6.5 % 5.3  - - - -  PH, Arterial 7.35 - 7.45 - - - - 7.43   PCO2 arterial 32 - 48 mmHg - - - - 40   Bicarbonate 20.0 - 28.0 mmol/L - - - - 26.5   O2 Saturation % - - - - 98.7      Exercise Target Goals: Exercise Program Goal: Individual exercise prescription set using results from initial 6 min walk test and THRR while considering  patient's activity barriers and safety.   Exercise Prescription Goal: Initial exercise prescription builds to 30-45 minutes a day of aerobic activity, 2-3 days per week.  Home exercise guidelines will be given to patient during program as part of exercise prescription that the participant will acknowledge.   Education: Aerobic Exercise: - Group verbal and visual presentation on the components of exercise prescription. Introduces F.I.T.T principle from ACSM for exercise prescriptions.  Reviews F.I.T.T. principles of aerobic exercise including progression. Written material given at graduation. Flowsheet Row Cardiac Rehab from 06/06/2022 in [facility] Cardiac and Pulmonary Rehab  Education need identified 03/21/22  Date 05/30/22  Educator [place]  Instruction Review Code 1- Verbalizes Understanding       Education: Resistance Exercise: - Group verbal and visual presentation on the components of exercise prescription. Introduces F.I.T.T principle from ACSM for exercise prescriptions  Reviews F.I.T.T. principles of resistance exercise including progression. Written material given at graduation.    Education: Exercise & Equipment Safety: - Individual verbal instruction and demonstration of equipment use and safety with use of the equipment. Flowsheet Row Cardiac Rehab from 06/06/2022 in [facility] Cardiac and Pulmonary Rehab  Date 03/21/22  Educator [facility]  Instruction Review Code 1- Verbalizes Understanding       Education: Exercise Physiology & General Exercise Guidelines: - Group  verbal and written instruction with models to review the exercise physiology of the cardiovascular system and associated critical values. Provides general exercise guidelines with specific guidelines to those with heart or lung disease.  Flowsheet Row Cardiac Rehab from 06/06/2022 in [facility] Cardiac and Pulmonary Rehab  Date 05/23/22  Educator NT  Instruction Review Code 1- [employer] Understanding       Education: Flexibility, Balance, Mind/Body Relaxation: - Group verbal and visual presentation with interactive activity on the components of exercise prescription. Introduces F.I.T.T principle from ACSM for exercise prescriptions. Reviews F.I.T.T. principles of flexibility and balance exercise training including progression. Also discusses the mind body connection.  Reviews various relaxation techniques to help reduce and manage stress (i.e. Deep breathing, progressive muscle relaxation, and visualization). Balance handout  provided to take home. Written material given at graduation. Flowsheet Row Cardiac Rehab from 06/06/2022 in [facility] Cardiac and Pulmonary Rehab  Date 04/11/22  Educator [place]  Instruction Review Code 1- Verbalizes Understanding       Activity Barriers & Risk Stratification:  Activity Barriers & Cardiac Risk Stratification - 03/21/22 1506       Activity Barriers & Cardiac Risk Stratification   Activity Barriers Shortness of Breath;Neck/Spine Problems;Joint Problems;Other (comment);Deconditioning;Muscular Weakness;Balance Concerns;History of Falls;Arthritis;Back Problems    Comments Parkinsons    Cardiac Risk Stratification Moderate             6 Minute Walk:  6 Minute Walk     Row Name 03/21/22 1504 06/04/22 1359       6 Minute Walk   Phase Initial Discharge    Distance 800 feet 1100 feet    Distance % Change -- 38 %    Distance Feet Change -- 300 ft    Walk Time 6 minutes 6 minutes    # of Rest Breaks 0 0    MPH 1.51 2.08    METS 1.61 1.85    RPE 16 13     Perceived Dyspnea  3 3    VO2 Peak 5.64 6.49    Symptoms Yes (comment) No    Comments SOB, fatigue --    Resting HR 67 bpm 68 bpm    Resting BP 110/64 104/60    Resting Oxygen Saturation  97 % 94 %    Exercise Oxygen Saturation  during 6 min walk 91 % 92 %    Max Ex. HR 86 bpm 82 bpm    Max Ex. BP 128/70 122/58    2 Minute Post BP 114/62 112/60      Interval HR   1 Minute HR 79 --    2 Minute HR 81 --    3 Minute HR 84 --    4 Minute HR 84 --    5 Minute HR 85 --    6 Minute HR 86 --    Interval Heart Rate? Yes  Monitored as office notes mentioned SOB with exertion --      Interval Oxygen   Interval Oxygen? Yes --    Baseline Oxygen Saturation % 97 % --    1 Minute Oxygen Saturation % 97 % --    1 Minute Liters of Oxygen 0 L  Room Air --    2 Minute Oxygen Saturation % 94 % --    2 Minute Liters of Oxygen 0 L --    3 Minute Oxygen Saturation % 98 % --    3 Minute Liters of Oxygen 0 L --    4 Minute Oxygen Saturation % 95 % --    4 Minute Liters of Oxygen 0 L --    5 Minute Oxygen Saturation % 92 % --    5 Minute Liters of Oxygen 0 L --    6 Minute Oxygen Saturation % 91 % --    6 Minute Liters of Oxygen 0 L --    2 Minute Post Oxygen Saturation % 98 % --    2 Minute Post Liters of Oxygen 0 L --             Oxygen Initial Assessment:   Oxygen Re-Evaluation:   Oxygen Discharge (Final Oxygen Re-Evaluation):   Initial Exercise Prescription:  Initial Exercise Prescription - 03/21/22 1500       Date of Initial Exercise RX  and Referring Provider   Date 03/21/22    Referring Provider [name] MD      Oxygen   Maintain Oxygen Saturation 88% or higher      Recumbant Bike   Level 1    RPM 50    Watts 10    Minutes 15    METs 1.5      T5 Nustep   Level 1    SPM 80    Minutes 15    METs 1.5      Biostep-RELP   Level 1    SPM 50    Minutes 15    METs 2      Track   Laps 21    Minutes 15    METs 2.14      Prescription Details    Frequency (times per week) 3    Duration Progress to 30 minutes of continuous aerobic without signs/symptoms of physical distress      Intensity   THRR 40-80% of Max Heartrate 97-126    Ratings of Perceived Exertion 11-13    Perceived Dyspnea 0-4      Progression   Progression Continue to progress workloads to maintain intensity without signs/symptoms of physical distress.      Resistance Training   Training Prescription Yes    Weight 2 lb    Reps 10-15             Perform Capillary Blood Glucose checks as needed.  Exercise Prescription Changes:   Exercise Prescription Changes     Row Name 03/21/22 1500 04/02/22 0800 04/17/22 1600 04/18/22 1500 04/30/22 1400     Response to Exercise   Blood Pressure (Admit) 110/64 122/64 122/74 -- 132/70   Blood Pressure (Exercise) 128/70 130/64 134/58 -- 126/70   Blood Pressure (Exit) 114/62 120/66 138/68 -- 118/62   Heart Rate (Admit) 67 bpm 82 bpm 75 bpm -- 78 bpm   Heart Rate (Exercise) 86 bpm 88 bpm 104 bpm -- 85 bpm   Heart Rate (Exit) 72 bpm 74 bpm 83 bpm -- 73 bpm   Oxygen Saturation (Admit) 97 % -- -- -- --   Oxygen Saturation (Exercise) 91 % -- -- -- --   Oxygen Saturation (Exit) 98 % -- -- -- --   Rating of Perceived Exertion (Exercise) _0 -- 13   Perceived Dyspnea (Exercise) 3 -- -- -- --   Symptoms SOB, fatigue fatigue fatigue -- none   Comments walk test results first full day of exercise -- -- --   Duration -- Progress to 30 minutes of  aerobic without signs/symptoms of physical distress Progress to 30 minutes of  aerobic without signs/symptoms of physical distress -- Continue with 30 min of aerobic exercise without signs/symptoms of physical distress.   Intensity -- THRR unchanged THRR unchanged -- THRR unchanged     Progression   Progression -- Continue to progress workloads to maintain intensity without signs/symptoms of physical distress. Continue to progress workloads to maintain intensity without  signs/symptoms of physical distress. -- Continue to progress workloads to maintain intensity without signs/symptoms of physical distress.   Average METs -- 2.03 1.92 -- 1.9     Resistance Training   Training Prescription -- Yes Yes -- Yes   Weight -- 2 lb 2 lb -- 2 lb   Reps -- 10-15 10-15 -- 10-15     Interval Training   Interval Training -- No No -- No     Recumbant Bike  Level -- 1 -- -- --   Watts -- 11 -- -- --   Minutes -- 15 -- -- --   METs -- 2.57 -- -- --     NuStep   Level -- -- -- -- 3   Minutes -- -- -- -- 15   METs -- -- -- -- 1.9     T5 Nustep   Level -- 1 1 -- 1   Minutes -- 15 15 -- 15   METs -- 1.5 1.8 -- 1.5     Biostep-RELP   Level -- -- 1 -- 2   Minutes -- -- 15 -- 15   METs -- -- 2 -- 2     Track   Laps -- -- 29 -- 22   Minutes -- -- 15 -- 15   METs -- -- 2.58 -- 2.2     Home Exercise Plan   Plans to continue exercise at -- -- -- [employer] (comment)  [employer] (comment)  [employer]   Frequency -- -- -- Add 2 additional days to program exercise sessions. Add 2 additional days to program exercise sessions.   Initial Home Exercises Provided -- -- -- 04/18/22 04/18/22     Oxygen   Maintain Oxygen Saturation -- 88% or higher 88% or higher -- 88% or higher    Row Name 05/15/22 1600 05/28/22 1300 06/11/22 1100         Response to Exercise   Blood Pressure (Admit) 110/64 112/60 102/60     Blood Pressure (Exercise) 142/70 -- --     Blood Pressure (Exit) 118/60 126/62 124/62     Heart Rate (Admit) 65 bpm 62 bpm 67 bpm     Heart Rate (Exercise) 83 bpm 90 bpm 90 bpm     Heart Rate (Exit) 70 bpm 68 bpm 68 bpm     Rating of Perceived Exertion (Exercise) _0 Symptoms none none none     Duration Continue with 30 min of aerobic exercise without signs/symptoms of physical distress. Continue with 30 min of aerobic exercise without signs/symptoms of physical distress. Continue with 30 min of aerobic exercise  without signs/symptoms of physical distress.     Intensity THRR unchanged THRR unchanged THRR unchanged       Progression   Progression Continue to progress workloads to maintain intensity without signs/symptoms of physical distress. Continue to progress workloads to maintain intensity without signs/symptoms of physical distress. Continue to progress workloads to maintain intensity without signs/symptoms of physical distress.     Average METs 1.87 1.64 2.13       Resistance Training   Training Prescription Yes Yes Yes     Weight 2 lb 2 lb 2 lb     Reps 10-15 10-15 10-15       Interval Training   Interval Training No No No       Recumbant Bike   Level -- -- 1     Minutes -- -- 15     METs -- -- 2.58       NuStep   Level _1 Minutes _2 METs 1.8 1.6 2       T5 Nustep   Level -- 1 2     Minutes -- 15 15     METs -- 1.5 2       Track   Laps 21 18 30  Minutes _0 METs 2.14 1.98 2.63       Home Exercise Plan   Plans to continue exercise at [employer] (comment)  [employer] (comment)  [place] (comment)  [employer]     Frequency Add 2 additional days to program exercise sessions. Add 2 additional days to program exercise sessions. Add 2 additional days to program exercise sessions.     Initial Home Exercises Provided 04/18/22 04/18/22 04/18/22       Oxygen   Maintain Oxygen Saturation 88% or higher 88% or higher 88% or higher              Exercise Comments:   Exercise Comments     Row Name 03/26/22 1331           Exercise Comments First full day of exercise!  Patient was oriented to gym and equipment including functions, settings, policies, and procedures.  Patient's individual exercise prescription and treatment plan were reviewed.  All starting workloads were established based on the results of the 6 minute walk test done at initial orientation visit.  The plan for exercise  progression was also introduced and progression will be customized based on patient's performance and goals.                Exercise Goals and Review:   Exercise Goals     Row Name 03/21/22 1508             Exercise Goals   Increase Physical Activity Yes       Intervention Provide advice, education, support and counseling about physical activity/exercise needs.;Develop an individualized exercise prescription for aerobic and resistive training based on initial evaluation findings, risk stratification, comorbidities and participant's personal goals.       Expected Outcomes Short Term: Attend rehab on a regular basis to increase amount of physical activity.;Long Term: Add in home exercise to make exercise part of routine and to increase amount of physical activity.;Long Term: Exercising regularly at least 3-5 days a week.       Increase Strength and Stamina Yes       Intervention Provide advice, education, support and counseling about physical activity/exercise needs.;Develop an individualized exercise prescription for aerobic and resistive training based on initial evaluation findings, risk stratification, comorbidities and participant's personal goals.       Expected Outcomes Short Term: Increase workloads from initial exercise prescription for resistance, speed, and METs.;Short Term: Perform resistance training exercises routinely during rehab and add in resistance training at home;Long Term: Improve cardiorespiratory fitness, muscular endurance and strength as measured by increased METs and functional capacity (6MWT)       Able to understand and use rate of perceived exertion (RPE) scale Yes       Intervention Provide education and explanation on how to use RPE scale       Expected Outcomes Short Term: Able to use RPE daily in rehab to express subjective intensity level;Long Term:  Able to use RPE to guide intensity level when exercising independently       Able to understand and use  Dyspnea scale Yes       Intervention Provide education and explanation on how to use Dyspnea scale       Expected Outcomes Short Term: Able to use Dyspnea scale daily in rehab to express subjective sense of shortness of breath during exertion;Long Term: Able to use Dyspnea scale to guide intensity level  when exercising independently       Knowledge and understanding of Target Heart Rate Range (THRR) Yes       Intervention Provide education and explanation of THRR including how the numbers were predicted and where they are located for reference       Expected Outcomes Short Term: Able to state/look up THRR;Short Term: Able to use daily as guideline for intensity in rehab;Long Term: Able to use THRR to govern intensity when exercising independently       Able to check pulse independently Yes       Intervention Provide education and demonstration on how to check pulse in carotid and radial arteries.;Review the importance of being able to check your own pulse for safety during independent exercise       Expected Outcomes Short Term: Able to explain why pulse checking is important during independent exercise;Long Term: Able to check pulse independently and accurately       Understanding of Exercise Prescription Yes       Intervention Provide education, explanation, and written materials on patient's individual exercise prescription       Expected Outcomes Short Term: Able to explain program exercise prescription;Long Term: Able to explain home exercise prescription to exercise independently                Exercise Goals Re-Evaluation :  Exercise Goals Re-Evaluation     Row Name 03/26/22 1331 04/02/22 0850 04/17/22 1608 04/18/22 1514 04/30/22 1428     Exercise Goal Re-Evaluation   Exercise Goals Review Increase Physical Activity;Able to understand and use rate of perceived exertion (RPE) scale;Knowledge and understanding of Target Heart Rate Range (THRR);Understanding of Exercise Prescription;Able  to understand and use Dyspnea scale;Able to check pulse independently;Increase Strength and Stamina Increase Physical Activity;Increase Strength and Stamina;Understanding of Exercise Prescription Increase Physical Activity;Increase Strength and Stamina;Understanding of Exercise Prescription Increase Physical Activity;Increase Strength and Stamina;Understanding of Exercise Prescription Increase Physical Activity;Increase Strength and Stamina;Understanding of Exercise Prescription   Comments Reviewed RPE and dyspnea scales, THR and program prescription with pt today.  Pt voiced understanding and was given a copy of goals to take home. Teiara has only had one visit thus far.  We attempted a phone call last week to check, but had to leave a message without return call.  We will try again this week if she does not come today. Satina is coming to rehab consistently. She is off to a good start with being able to complete 29 laps on the track! She hits her highest HR when walking. We will continue to monitor as she progresses. Reviewed home exercise with pt today.  Pt plans to use her gym at her living facility at [facility] for exercise.  She also likes to walk as well. Reviewed THR, pulse, RPE, sign and symptoms, pulse oximetery and when to call 911 or MD.  Also discussed weather considerations and indoor options.  Pt voiced understanding. Maekayla is doing well in rehab.  She is up to 22 laps on the track.  She is also on level 3 for the NuStep.  We will continue to monitor his progress.   Expected Outcomes Short: Use RPE daily to regulate intensity. Long: Follow program prescription in THR. Short: Attend rehab regularly Long: Follow program prescription Short: Continue to increase laps on track Long: Continue to increase overall strength and stamina Short: Start checking HR during exercise Long: Continue to exercise independently at home at appropriate prescription Short: Continue to increase workloads Long;  Conitnue to  improve stamina    Row Name 05/15/22 1622 05/21/22 1405 05/28/22 1328 06/11/22 1140       Exercise Goal Re-Evaluation   Exercise Goals Review Increase Physical Activity;Increase Strength and Stamina;Understanding of Exercise Prescription Increase Physical Activity;Increase Strength and Stamina;Understanding of Exercise Prescription Increase Physical Activity;Increase Strength and Stamina;Understanding of Exercise Prescription Increase Physical Activity;Increase Strength and Stamina;Understanding of Exercise Prescription    Comments Lindsey continues to do well in rehab. She has only worked on the track and T4 since last review and workloads remain the same. She would benefit from increasing to 3 lbs for handweights. She is not quite hitting her THR but RPEs are in appropriate range. Will continue to monitor. Cristyn reports that she has not been going to the gym since [place] began and she would like to start up again at the gym in [facility]. Trinity is doing well in rehab. She had seen a decline in the number of laps she was getting on the track but she has worked her way back up to 18 laps. She has tolerated level 2 on the T4. She has done well with 2 lb hand weights for resistance training as well. We will continue to monitor Kyriana's progress in the program. Autry is doing well in rehab and is ready to graduate. She improved on her post 6 MWT by 38%. She improved her average overall MET level to 2.13 METs as well. She also increased her laps on the track to 30 laps. We will continue to monitor her progress in the program until she graduates.    Expected Outcomes Short: Increase to 3 lb handweights Long: Continue to increase overall MET level Short: visit gym at [facility] and start going at least 2x/week when not at rehab to begin the habit Long: Continue to increase overall MET level Short: Push for more laps on the track. Long: Continue to improve strength and stamina. Short: Graduate. Long: Continue to  exercise independently.             Discharge Exercise Prescription (Final Exercise Prescription Changes):  Exercise Prescription Changes - 06/11/22 1100       Response to Exercise   Blood Pressure (Admit) 102/60    Blood Pressure (Exit) 124/62    Heart Rate (Admit) 67 bpm    Heart Rate (Exercise) 90 bpm    Heart Rate (Exit) 68 bpm    Rating of Perceived Exertion (Exercise) 15    Symptoms none    Duration Continue with 30 min of aerobic exercise without signs/symptoms of physical distress.    Intensity THRR unchanged      Progression   Progression Continue to progress workloads to maintain intensity without signs/symptoms of physical distress.    Average METs 2.13      Resistance Training   Training Prescription Yes    Weight 2 lb    Reps 10-15      Interval Training   Interval Training No      Recumbant Bike   Level 1    Minutes 15    METs 2.58      NuStep   Level 4    Minutes 15    METs 2      T5 Nustep   Level 2    Minutes 15    METs 2      Track   Laps 30    Minutes 15    METs 2.63      Home Exercise Plan  Plans to continue exercise at [facility] (comment)   [employer]   Frequency Add 2 additional days to program exercise sessions.    Initial Home Exercises Provided 04/18/22      Oxygen   Maintain Oxygen Saturation 88% or higher             Nutrition:  Target Goals: Understanding of nutrition guidelines, daily intake of sodium <159m, cholesterol <2029m calories 30% from fat and 7% or less from saturated fats, daily to have 5 or more servings of fruits and vegetables.  Education: All About Nutrition: -Group instruction provided by verbal, written material, interactive activities, discussions, models, and posters to present general guidelines for heart healthy nutrition including fat, fiber, MyPlate, the role of sodium in heart healthy nutrition, utilization of the nutrition label, and utilization of this knowledge for meal  planning. Follow up email sent as well. Written material given at graduation. [facility]  Education need identified 03/21/22  Date 04/18/22  Educator [facility] RankinInstruction Review Code 1- Verbalizes Understanding       Biometrics:   Post Biometrics - 06/04/22 1403        Post  Biometrics   Height _0  (1.575 m)    Weight 121 lb (54.9 kg)    BMI (Calculated) 22.13    Single Leg Stand 10 seconds             Nutrition Therapy Plan and Nutrition Goals:  Nutrition Therapy & Goals - 05/21/22 1407       Nutrition Therapy   RD appointment deferred Yes             Nutrition Assessments:  MEDIFICTS Score Key: ?70 Need to make dietary changes  40-70 Heart Healthy Diet ? 40 Therapeutic Level Cholesterol Diet  [facility]  Picture Your Plate Total Score on Admission 67      Picture Your Plate Scores: <4<94nhealthy dietary pattern with much room for improvement. 41-50 Dietary pattern unlikely to meet recommendations for good health and room for improvement. 51-60 More healthful dietary pattern, with some room for improvement.  >60 Healthy dietary pattern, although there may be some specific behaviors that could be improved.    Nutrition Goals Re-Evaluation:  Nutrition Goals Re-Evaluation     Ro[place]end [place] 04/18/22 1515 05/21/22 1406           Goals   Nutrition Goal Patient has still declined nutrition at this time. She is attending the group nutrition education class today and said she would consider making an appointment with the RD if she decides to do so. She will let usKoreanow what she decides. Pt still reports not wanting to meet with RD at this time, will continue to follow up.               Nutrition Goals Discharge (Final Nutrition Goals Re-Evaluation):  Nutrition Goals Re-Evaluation - 05/21/22 1406       Goals   Nutrition  Goal Pt still reports not wanting to meet with RD at this time, will continue to follow up.             Psychosocial: Target Goals: Acknowledge presence or absence of significant depression and/or stress, maximize coping skills, provide positive support system. Participant is able to verbalize types and ability to use techniques and skills needed for reducing stress and depression.   Education:  Stress, Anxiety, and Depression - Group verbal and visual presentation to define topics covered.  Reviews how body is impacted by stress, anxiety, and depression.  Also discusses healthy ways to reduce stress and to treat/manage anxiety and depression.  Written material given at graduation.   Education: Sleep Hygiene -Provides group verbal and written instruction about how sleep can affect your health.  Define sleep hygiene, discuss sleep cycles and impact of sleep habits. Review good sleep hygiene tips.    Initial Review & Psychosocial Screening:  Initial Psych Review & Screening - 03/12/22 1546       Initial Review   Current issues with Current Stress Concerns    Source of Stress Concerns Unable to participate in former interests or hobbies;Unable to perform yard/household activities      [facility]? Yes   family     Barriers   Psychosocial barriers to participate in program There are no identifiable barriers or psychosocial needs.;The patient should benefit from training in stress management and relaxation.      Screening Interventions   Interventions Encouraged to exercise;Provide feedback about the scores to participant;To provide support and resources with identified psychosocial needs    Expected Outcomes Long Term Goal: Stressors or current issues are controlled or eliminated.;Short Term goal: Utilizing psychosocial counselor, staff and physician to assist with identification of specific Stressors or current issues interfering with healing process. Setting  desired goal for each stressor or current issue identified.;Long Term goal: The participant improves quality of Life and PHQ9 Scores as seen by post scores and/or verbalization of changes;Short Term goal: Identification and review with participant of any Quality of Life or Depression concerns found by scoring the questionnaire.             Quality of Life Scores:   Quality of Life - 03/21/22 1508       Quality of Life   Select Quality of Life      Quality of Life Scores   Health/Function Pre 12.86 %    Socioeconomic Pre 21.5 %    Psych/Spiritual Pre 20.83 %    Family Pre 27.5 %    GLOBAL Pre 17.63 %            Scores of 19 and below usually indicate a poorer quality of life in these areas.  A difference of  2-3 points is a clinically meaningful difference.  A difference of 2-3 points in the total score of the Quality of Life Index has been associated with significant improvement in overall quality of life, self-image, physical symptoms, and general health in studies assessing change in quality of life.  PHQ-9: Review Flowsheet  More data exists      05/31/2022 05/09/2022 04/16/2022 03/21/2022 08/08/2021  Depression screen PHQ 2/9  Decreased Interest 0 0 1 2 0  Down, Depressed, Hopeless 0 0 0 1 0  PHQ - 2 Score 0 0 1 3 0  Altered sleeping - 0 3 1 -  Tired, decreased energy - _0 -  Change in appetite - _1 -  Feeling bad or failure about yourself  - 0 0 1 -  Trouble concentrating - 0 0 0 -  Moving slowly or fidgety/restless - 0 0 0 -  Suicidal thoughts - 0 0 0 -  PHQ-9 Score - _2 -  Difficult doing work/chores - Somewhat difficult Not difficult at all Somewhat difficult -   Interpretation of Total Score  Total Score Depression Severity:  1-4 = Minimal depression, 5-9 = Mild depression, 10-14 = Moderate depression, 15-19 = Moderately severe depression, 20-27 = Severe depression   Psychosocial Evaluation and Intervention:  Psychosocial Evaluation - 03/12/22 1547        Psychosocial Evaluation & Interventions   Interventions Encouraged to exercise with the program and follow exercise prescription    Comments Chisa is coming to cardiac rehab for mitral valve repair. She has noticed a little difference post surgery, but she states her breathing issues come mainly from her COPD. She also was diagnosed with parkinson's in 2016. She lives at [facility] and still drives. She has been trying to walk more, but her breathing has gotten in the way. She is hopeful this program will help with both post op and COPD. She does take Klonopin to help sleep and with the Parkinson nightmares, which she states is managing both concerns.    Expected Outcomes Short: attend cardiac rehab for education and exercise. Long; develop and maintain positive self care habits.    Continue Psychosocial Services  Follow up required by staff             Psychosocial Re-Evaluation:  Psychosocial Re-Evaluation     [place] Name 04/16/22 1345 04/18/22 1519 05/21/22 1407         Psychosocial Re-Evaluation   Current issues with Current Stress Concerns Current Sleep Concerns Current Sleep Concerns     Comments Reviewed patient health questionnaire (PHQ-9) with patient for follow up. Previously, patients score indicated signs/symptoms of depression.  Reviewed to see if patient is improving symptom wise while in program.  Score stayed the same. Patient is working towards improving her mental health by exercising. Matisyn is doing well mentally. She saw her provider yesterday for her Parkinson's and says her doctor is pleased with how she is doing and she feels she is managing it well. Her COPD is still limiting her breathing but hopes her exercise will make it improve overtime. She still practices PLB when she needs to. She states she feels she sleeps too much and has discussed it with her doctors who said they are not concerned about it. Informed her to say something to her doctor if she feels like  it is impairing her lifestyle. She is not on any medications for depression or anxiety and declines any other problems at this time. Kierston is doing well mentally. She still practices PLB when she needs to. She reports reports sleeping well and maybe even too much- she has discussed it with her doctors who said they are not concerned about it. Encouraged her again to say something to her doctor if she feels like it is impairing her lifestyle. She is not on any medications for depression or anxiety and declines any other problems at this time.     Expected Outcomes Short: Continue to work toward an improvement in [place] scores by attending [facility] regularly. Long: Continue to improve stress and depression coping skills by talking with staff and attending [facility] regularly and work toward a positive mental state. Short: Continue to attend rehab regularly Long: Continue to maintain positive attitude Short: Continue to attend rehab regularly Long: Continue to maintain positive attitude     Interventions Encouraged to attend Cardiac Rehabilitation for the exercise Encouraged to attend Cardiac Rehabilitation for the exercise Encouraged to attend Cardiac Rehabilitation for the exercise     Continue Psychosocial Services  Follow up required by staff Follow up required by staff Follow up  required by staff              Psychosocial Discharge (Final Psychosocial Re-Evaluation):  Psychosocial Re-Evaluation - 05/21/22 1407       Psychosocial Re-Evaluation   Current issues with Current Sleep Concerns    Comments Adabelle is doing well mentally. She still practices PLB when she needs to. She reports reports sleeping well and maybe even too much- she has discussed it with her doctors who said they are not concerned about it. Encouraged her again to say something to her doctor if she feels like it is impairing her lifestyle. She is not on any medications for depression or anxiety and declines any other problems at  this time.    Expected Outcomes Short: Continue to attend rehab regularly Long: Continue to maintain positive attitude    Interventions Encouraged to attend Cardiac Rehabilitation for the exercise    Continue Psychosocial Services  Follow up required by staff             Vocational Rehabilitation: Provide vocational rehab assistance to qualifying candidates.   Vocational Rehab Evaluation & Intervention:  Vocational Rehab - 03/12/22 1538       Initial Vocational Rehab Evaluation & Intervention   Assessment shows need for Vocational Rehabilitation No             Education: Education Goals: Education classes will be provided on a variety of topics geared toward better understanding of heart health and risk factor modification. Participant will state understanding/return demonstration of topics presented as noted by education test scores.  Learning Barriers/Preferences:  Learning Barriers/Preferences - 03/12/22 1536       Learning Barriers/Preferences   Learning Barriers None    Learning Preferences Individual Instruction             General Cardiac Education Topics:  AED/CPR: - Group verbal and written instruction with the use of models to demonstrate the basic use of the AED with the basic ABC's of resuscitation.   Anatomy and Cardiac Procedures: - Group verbal and visual presentation and models provide information about basic cardiac anatomy and function. Reviews the testing methods done to diagnose heart disease and the outcomes of the test results. Describes the treatment choices: Medical Management, Angioplasty, or Coronary Bypass Surgery for treating various heart conditions including Myocardial Infarction, Angina, Valve Disease, and Cardiac Arrhythmias.  Written material given at graduation. Flowsheet Row Cardiac Rehab from 06/06/2022 in [facility] Cardiac and Pulmonary Rehab  Education need identified 03/21/22  Date 06/06/22  Educator SB  Instruction Review Code  1- Verbalizes Understanding       Medication Safety: - Group verbal and visual instruction to review commonly prescribed medications for heart and lung disease. Reviews the medication, class of the drug, and side effects. Includes the steps to properly store meds and maintain the prescription regimen.  Written material given at graduation.   Intimacy: - Group verbal instruction through game format to discuss how heart and lung disease can affect sexual intimacy. Written material given at graduation.. Flowsheet Row Cardiac Rehab from 06/06/2022 in [facility] Cardiac and Pulmonary Rehab  Date 05/30/22  Educator [place]  Instruction Review Code 1- Verbalizes Understanding       Know Your Numbers and Heart Failure: - Group verbal and visual instruction to discuss disease risk factors for cardiac and pulmonary disease and treatment options.  Reviews associated critical values for Overweight/Obesity, Hypertension, Cholesterol, and Diabetes.  Discusses basics of heart failure: signs/symptoms and treatments.  Introduces Heart Failure Zone chart  for action plan for heart failure.  Written material given at graduation. Flowsheet Row Cardiac Rehab from 06/06/2022 in [facility] Cardiac and Pulmonary Rehab  Date 05/02/22  Educator SB  Instruction Review Code 1- Verbalizes Understanding       Infection Prevention: - Provides verbal and written material to individual with discussion of infection control including proper hand washing and proper equipment cleaning during exercise session. Flowsheet Row Cardiac Rehab from 06/06/2022 in [facility] Cardiac and Pulmonary Rehab  Date 03/21/22  Educator [facility]  Instruction Review Code 1- Verbalizes Understanding       Falls Prevention: - Provides verbal and written material to individual with discussion of falls prevention and safety. Flowsheet Row Cardiac Rehab from 06/06/2022 in [facility] Cardiac and Pulmonary Rehab  Date 03/21/22  Educator [facility]  Instruction Review Code 1-  Verbalizes Understanding       Other: -Provides group and verbal instruction on various topics (see comments)   Knowledge Questionnaire Score:  Knowledge Questionnaire Score - 03/21/22 1509       Knowledge Questionnaire Score   Pre Score 19/26             Core Components/Risk Factors/Patient Goals at Admission:  Personal Goals and Risk Factors at Admission - 03/21/22 1509       Core Components/Risk Factors/Patient Goals on Admission    Weight Management Yes;Weight Maintenance    Intervention Weight Management: Develop a combined nutrition and exercise program designed to reach desired caloric intake, while maintaining appropriate intake of nutrient and fiber, sodium and fats, and appropriate energy expenditure required for the weight goal.;Weight Management: Provide education and appropriate resources to help participant work on and attain dietary goals.    Admit Weight 122 lb 1.6 oz (55.4 kg)    Goal Weight: Short Term 123 lb (55.8 kg)    Goal Weight: Long Term 123 lb (55.8 kg)    Expected Outcomes Short Term: Continue to assess and modify interventions until short term weight is achieved;Long Term: Adherence to nutrition and physical activity/exercise program aimed toward attainment of established weight goal;Weight Maintenance: Understanding of the daily nutrition guidelines, which includes 25-35% calories from fat, 7% or less cal from saturated fats, less than 262m cholesterol, less than 1.5gm of sodium, & 5 or more servings of fruits and vegetables daily    Improve shortness of breath with ADL's Yes    Intervention Provide education, individualized exercise plan and daily activity instruction to help decrease symptoms of SOB with activities of daily living.    Expected Outcomes Short Term: Improve cardiorespiratory fitness to achieve a reduction of symptoms when performing ADLs;Long Term: Be able to perform more ADLs without symptoms or delay the onset of symptoms     Hypertension Yes    Intervention Provide education on lifestyle modifcations including regular physical activity/exercise, weight management, moderate sodium restriction and increased consumption of fresh fruit, vegetables, and low fat dairy, alcohol moderation, and smoking cessation.;Monitor prescription use compliance.    Expected Outcomes Short Term: Continued assessment and intervention until BP is < 140/964mHG in hypertensive participants. < 130/8033mG in hypertensive participants with diabetes, heart failure or chronic kidney disease.;Long Term: Maintenance of blood pressure at goal levels.             Education:Diabetes - Individual verbal and written instruction to review signs/symptoms of diabetes, desired ranges of glucose level fasting, after meals and with exercise. Acknowledge that pre and post exercise glucose checks will be done for 3 sessions at entry of program.  Core Components/Risk Factors/Patient Goals Review:   Goals and Risk Factor Review     Row Name 04/18/22 1516 05/21/22 1408           Core Components/Risk Factors/Patient Goals Review   Personal Goals Review Weight Management/Obesity;Hypertension;Improve shortness of breath with ADL's Weight Management/Obesity;Hypertension;Improve shortness of breath with ADL's      Review Toshika is doing well in rehab. She is checking her weight at home and is trying to at least maintain if not gain some weight. Her lowest she has been is 117 lb and monitor to make sure she does not have any significant weight loss. We talked about using her clothes fitting as a good measurement as well as she could be increasing some muscle mass. She is also checking her BP at home which seems to be stable. She reports 309 systolic and 40-76K diastolic.  Her SOB is very limiting with her daily activites due to her COPD which she does not see so much of an improvement yet. She enjoys the program and is staying compliant taking all her medications.  Rosita is doing well in rehab. She reports her weight has been stable. She is not checking her BP as frequently anymore - she would like to get back into the habit; today her BP was 132/76. Her SOB is very limiting with her daily activites due to her COPD which she does not see so much of an improvement yet- she reports having good days and bad days. She enjoys the program and is staying compliant taking all her medications with no issues.      Expected Outcomes Short: Continue to monitor weight and make note of any abnormal changes Long: Continue to manage lifestyle risk factors Short: Continue to monitor weight and make note of any abnormal changes, get back into routine of checking BP at home Long: Continue to manage lifestyle risk factors               Core Components/Risk Factors/Patient Goals at Discharge (Final Review):   Goals and Risk Factor Review - 05/21/22 1408       Core Components/Risk Factors/Patient Goals Review   Personal Goals Review Weight Management/Obesity;Hypertension;Improve shortness of breath with ADL's    Review Keilly is doing well in rehab. She reports her weight has been stable. She is not checking her BP as frequently anymore - she would like to get back into the habit; today her BP was 132/76. Her SOB is very limiting with her daily activites due to her COPD which she does not see so much of an improvement yet- she reports having good days and bad days. She enjoys the program and is staying compliant taking all her medications with no issues.    Expected Outcomes Short: Continue to monitor weight and make note of any abnormal changes, get back into routine of checking BP at home Long: Continue to manage lifestyle risk factors             ITP Comments:  ITP Comments     Row Name 03/12/22 1557 03/21/22 1504 03/26/22 1331 04/11/22 0914 05/09/22 1346   ITP Comments Initial telephone orientation completed. Diagnosis can be found in [facility] 3/23. EP orientation  scheduled for Wednesday 4/26 at 1:30pm Completed 6MWT and gym orientation. Initial ITP created and sent for review to Dr. Emily Filbert, Medical Director. First full day of exercise!  Patient was oriented to gym and equipment including functions, settings, policies, and procedures.  Patient's individual exercise prescription  and treatment plan were reviewed.  All starting workloads were established based on the results of the 6 minute walk test done at initial orientation visit.  The plan for exercise progression was also introduced and progression will be customized based on patient's performance and goals. 30 Day review completed. Medical Director ITP review done, changes made as directed, and signed approval by Medical Director.    NEW 30 Day review completed. Medical Director ITP review done, changes made as directed, and signed approval by Medical Director.    [place] Name 06/06/22 1027           ITP Comments 30 Day review completed. Medical Director ITP review done, changes made as directed, and signed approval by Medical Director.               Comments: Discharge ITP

## 2022-06-11 NOTE — Progress Notes (Signed)
Discharge Progress Report  Patient Details  Name: Crystal Bass MRN: [id number] Date of Birth: 21-Jan-1941 Referring Provider:   Flowsheet Row Cardiac Rehab from 03/21/2022 in [facility] Cardiac [facility]  Referring Provider [name] MD        Number of Visits: 36  Reason for Discharge:  Patient reached a stable level of exercise. Patient independent in their exercise. Patient has met program and personal goals.  Smoking History:  Social History   Tobacco Use  Smoking Status Never  Smokeless Tobacco Never    Diagnosis:  S/P mitral valve repair  ADL [facility]:   Initial Exercise Prescription:  Initial Exercise Prescription - 03/21/22 1500       Date of Initial Exercise RX and Referring Provider   Date 03/21/22    Referring Provider [name] MD      Oxygen   Maintain Oxygen Saturation 88% or higher      Recumbant Bike   Level 1    RPM 50    Watts 10    Minutes 15    METs 1.5      T5 Nustep   Level 1    SPM 80    Minutes 15    METs 1.5      Biostep-RELP   Level 1    SPM 50    Minutes 15    METs 2      Track   Laps 21    Minutes 15    METs 2.14      Prescription Details   Frequency (times per week) 3    Duration Progress to 30 minutes of continuous aerobic without signs/symptoms of physical distress      Intensity   THRR 40-80% of Max Heartrate 97-126    Ratings of Perceived Exertion 11-13    Perceived Dyspnea 0-4      Progression   Progression Continue to progress workloads to maintain intensity without signs/symptoms of physical distress.      Resistance Training   Training Prescription Yes    Weight 2 lb    Reps 10-15             Discharge Exercise Prescription (Final Exercise Prescription Changes):  Exercise Prescription Changes - 06/11/22 1100       Response to Exercise   Blood Pressure (Admit) 102/60    Blood Pressure (Exit) 124/62    Heart Rate (Admit) 67 bpm    Heart Rate (Exercise) 90 bpm    Heart  Rate (Exit) 68 bpm    Rating of Perceived Exertion (Exercise) 15    Symptoms none    Duration Continue with 30 min of aerobic exercise without signs/symptoms of physical distress.    Intensity THRR unchanged      Progression   Progression Continue to progress workloads to maintain intensity without signs/symptoms of physical distress.    Average METs 2.13      Resistance Training   Training Prescription Yes    Weight 2 lb    Reps 10-15      Interval Training   Interval Training No      Recumbant Bike   Level 1    Minutes 15    METs 2.58      NuStep   Level 4    Minutes 15    METs 2      T5 Nustep   Level 2    Minutes 15    METs 2      Track  Laps 30    Minutes 15    METs 2.63      Home Exercise Plan   Plans to continue exercise at [facility] (comment)   [employer]   Frequency Add 2 additional days to program exercise sessions.    Initial Home Exercises Provided 04/18/22      Oxygen   Maintain Oxygen Saturation 88% or higher             Functional Capacity:  6 Minute Walk     Row Name 03/21/22 1504 06/04/22 1359       6 Minute Walk   Phase Initial Discharge    Distance 800 feet 1100 feet    Distance % Change -- 38 %    Distance Feet Change -- 300 ft    Walk Time 6 minutes 6 minutes    # of Rest Breaks 0 0    MPH 1.51 2.08    METS 1.61 1.85    RPE 16 13    Perceived Dyspnea  3 3    VO2 Peak 5.64 6.49    Symptoms Yes (comment) No    Comments SOB, fatigue --    Resting HR 67 bpm 68 bpm    Resting BP 110/64 104/60    Resting Oxygen Saturation  97 % 94 %    Exercise Oxygen Saturation  during 6 min walk 91 % 92 %    Max Ex. HR 86 bpm 82 bpm    Max Ex. BP 128/70 122/58    2 Minute Post BP 114/62 112/60      Interval HR   1 Minute HR 79 --    2 Minute HR 81 --    3 Minute HR 84 --    4 Minute HR 84 --    5 Minute HR 85 --    6 Minute HR 86 --    Interval Heart Rate? Yes  Monitored as office notes mentioned SOB with exertion --       Interval Oxygen   Interval Oxygen? Yes --    Baseline Oxygen Saturation % 97 % --    1 Minute Oxygen Saturation % 97 % --    1 Minute Liters of Oxygen 0 L  Room Air --    2 Minute Oxygen Saturation % 94 % --    2 Minute Liters of Oxygen 0 L --    3 Minute Oxygen Saturation % 98 % --    3 Minute Liters of Oxygen 0 L --    4 Minute Oxygen Saturation % 95 % --    4 Minute Liters of Oxygen 0 L --    5 Minute Oxygen Saturation % 92 % --    5 Minute Liters of Oxygen 0 L --    6 Minute Oxygen Saturation % 91 % --    6 Minute Liters of Oxygen 0 L --    2 Minute Post Oxygen Saturation % 98 % --    2 Minute Post Liters of Oxygen 0 L --             Psychological, QOL, Others - Outcomes: PHQ 2/9:    05/31/2022    4:02 PM 05/09/2022    2:17 PM 04/16/2022    1:45 PM 03/21/2022    3:10 PM 08/08/2021   12:12 PM  Depression screen PHQ 2/9  Decreased Interest 0 0 1 2 0  Down, Depressed, Hopeless 0 0 0 1 0  PHQ -  2 Score 0 0 1 3 0  Altered sleeping  0 3 1   Tired, decreased energy  _0 Change in appetite  _1 Feeling bad or failure about yourself   0 0 1   Trouble concentrating  0 0 0   Moving slowly or fidgety/restless  0 0 0   Suicidal thoughts  0 0 0   PHQ-9 Score  _2 Difficult doing work/chores  Somewhat difficult Not difficult at all Somewhat difficult     Quality of Life:            Nutrition & Weight - Outcomes:   Post Biometrics - 06/04/22 1403        Post  Biometrics   Height _3  (1.575 m)    Weight 121 lb (54.9 kg)    BMI (Calculated) 22.13    Single Leg Stand 10 seconds             Nutrition:  Nutrition Therapy & Goals - 05/21/22 1407       Nutrition Therapy   RD appointment deferred Yes                  Goals reviewed with patient; copy given to patient.

## 2022-06-11 NOTE — Progress Notes (Signed)
Daily Session Note  Patient Details  Name: Crystal Bass MRN: [id number] Date of Birth: 06/27/1941 Referring Provider:   Flowsheet Row Cardiac Rehab from 03/21/2022 in [facility] Cardiac [facility] Pul[facility] Rehab  Referring Provider Crystal Sacramento MD       Encounter Date: 06/11/2022  Check In:  Session Check In - 06/11/22 1341       Check-In   Supervising physician immediately available to respond to emergencies See telemetry face sheet for immediately available ER MD    Location [facility]-Cardiac & Pulmonary Rehab    Staff Present Nyoka Cowden, RN, BSN, Ardeth Sportsman, RDN, Tawanna Solo, MS, ASCM CEP, Exercise Physiologist    Virtual Visit No    Medication changes reported     No    Fall or balance concerns reported    No    Tobacco Cessation No Change    Warm-up and Cool-down Performed on first and last piece of equipment    Resistance Training Performed Yes    VAD Patient? No    PAD/SET Patient? No      Pain Assessment   Currently in Pain? No/denies                Social History   Tobacco Use  Smoking Status Never  Smokeless Tobacco Never    Goals Met:  Independence with exercise equipment Exercise tolerated well No report of concerns or symptoms today  Goals Unmet:  Not Applicable  Comments:  Crystal Bass graduated today from  rehab with 36 sessions completed.  Details of the patient's exercise prescription and what She needs to do in order to continue the prescription and progress were discussed with patient.  Patient was given a copy of prescription and goals.  Patient verbalized understanding.  Crystal Bass plans to continue to exercise by attending [facility].    Dr. Emily Bass is Medical Director for [place].  Dr. Ottie Bass is Medical Director for [facility] Pulmonary Rehabilitation.

## 2022-06-12 ENCOUNTER — Ambulatory Visit: Payer: Medicare PPO | Admitting: Cardiovascular Disease

## 2022-06-13 ENCOUNTER — Other Ambulatory Visit: Payer: Self-pay | Admitting: Neurology

## 2022-06-13 DIAGNOSIS — G20 Parkinson's disease: Secondary | ICD-10-CM

## 2022-06-14 NOTE — Progress Notes (Signed)
Surgical Instructions    Your procedure is scheduled on Tuesday, 06/19/22.  Report to [facility] Main Entrance "A" at 5:30 A.M., then check in with the Admitting office.  Call this number if you have problems the morning of surgery:  [phone]   If you have any questions prior to your surgery date call [phone]: Open Monday-Friday 8am-4pm    Remember:  Do not eat or drink after midnight the night before your surgery      Take these medicines the morning of surgery with A SIP OF WATER:  amLODipine (NORVASC)  Carbidopa-Levodopa ER (SINEMET CR) fexofenadine (ALLEGRA)  Fluticasone-Umeclidin-Vilant (TRELEGY ELLIPTA)  gabapentin ([name])  pantoprazole (PROTONIX)   IF NEEDED: acetaminophen (TYLENOL)  albuterol (VENTOLIN HFA) inhaler- bring with you the day of surgery benzocaine-menthol (CHLORASEPTIC)  As of today, STOP taking any Aspirin (unless otherwise instructed by your surgeon) Aleve, Naproxen, Ibuprofen, Motrin, Advil, Goody's, BC's, all herbal medications, fish oil, and all vitamins.           Do not wear jewelry or makeup Do not wear lotions, powders, perfumes, or deodorant. Do not shave 48 hours prior to surgery.   Do not bring valuables to the hospital. Do not wear nail polish, gel polish, artificial nails, or any other type of covering on natural nails (fingers and toes) If you have artificial nails or gel coating that need to be removed by a nail salon, please have this removed prior to surgery. Artificial nails or gel coating may interfere with anesthesia's ability to adequately monitor your vital signs.  [facility] is not responsible for any belongings or valuables. .   Do NOT Smoke (Tobacco/Vaping)  24 hours prior to your procedure  If you use a CPAP at night, you may bring your mask for your overnight stay.   Contacts, glasses, hearing aids, dentures or partials may not be worn into surgery, please bring cases for these belongings   For patients  admitted to the hospital, discharge time will be determined by your treatment team.   Patients discharged the day of surgery will not be allowed to drive home, and someone needs to stay with them for 24 hours.   SURGICAL WAITING ROOM VISITATION Patients having surgery or a procedure may have no more than 2 support people in the waiting area - these visitors may rotate.   Children under the age of 83 must have an adult with them who is not the patient. If the patient needs to stay at the hospital during part of their recovery, the visitor guidelines for inpatient rooms apply. Pre-op nurse will coordinate an appropriate time for 1 support person to accompany patient in pre-op.  This support person may not rotate.   Please refer to the [facility] website for the visitor guidelines for Inpatients (after your surgery is over and you are in a regular room).    Special instructions:    Oral Hygiene is also important to reduce your risk of infection.  Remember - BRUSH YOUR TEETH THE MORNING OF SURGERY WITH YOUR REGULAR TOOTHPASTE   [facility]- Preparing For Surgery  Before surgery, you can play an important role. Because skin is not sterile, your skin needs to be as free of germs as possible. You can reduce the number of germs on your skin by washing with CHG (chlorahexidine gluconate) Soap before surgery.  CHG is an antiseptic cleaner which kills germs and bonds with the skin to continue killing germs even after washing.     Please  do not use if you have an allergy to CHG or antibacterial soaps. If your skin becomes reddened/irritated stop using the CHG.  Do not shave (including legs and underarms) for at least 48 hours prior to first CHG shower. It is OK to shave your face.  Please follow these instructions carefully.     Shower the NIGHT BEFORE SURGERY and the MORNING OF SURGERY with CHG Soap.   If you chose to wash your hair, wash your hair first as usual with your normal shampoo. After  you shampoo, rinse your hair and body thoroughly to remove the shampoo.  Then [employer] and genitals (private parts) with your normal soap and rinse thoroughly to remove soap.  After that Use CHG Soap as you would any other liquid soap. You can apply CHG directly to the skin and wash gently with a scrungie or a clean washcloth.   Apply the CHG Soap to your body ONLY FROM THE NECK DOWN.  Do not use on open wounds or open sores. Avoid contact with your eyes, ears, mouth and genitals (private parts). Wash Face and genitals (private parts)  with your normal soap.   Wash thoroughly, paying special attention to the area where your surgery will be performed.  Thoroughly rinse your body with warm water from the neck down.  DO NOT shower/wash with your normal soap after using and rinsing off the CHG Soap.  Pat yourself dry with a CLEAN TOWEL.  Wear CLEAN PAJAMAS to bed the night before surgery  Place CLEAN SHEETS on your bed the night before your surgery  DO NOT SLEEP WITH PETS.   Day of Surgery: Take a shower with CHG soap. Wear Clean/Comfortable clothing the morning of surgery Do not apply any deodorants/lotions.   Remember to brush your teeth WITH YOUR REGULAR TOOTHPASTE.    If you received a COVID test during your pre-op visit, it is requested that you wear a mask when out in public, stay away from anyone that may not be feeling well, and notify your surgeon if you develop symptoms. If you have been in contact with anyone that has tested positive in the last 10 days, please notify your surgeon.    Please read over the following fact sheets that you were given.

## 2022-06-15 ENCOUNTER — Other Ambulatory Visit: Payer: Self-pay

## 2022-06-15 ENCOUNTER — Encounter (HOSPITAL_COMMUNITY)
Admission: RE | Admit: 2022-06-15 | Discharge: 2022-06-15 | Disposition: A | Discharge status: Home / Self Care | Payer: Medicare PPO | Source: Ambulatory Visit | Attending: Cardiovascular Disease | Admitting: Cardiovascular Disease

## 2022-06-15 ENCOUNTER — Encounter (HOSPITAL_COMMUNITY): Payer: Self-pay

## 2022-06-15 ENCOUNTER — Telehealth: Payer: Self-pay | Admitting: Cardiology

## 2022-06-15 ENCOUNTER — Encounter: Payer: Self-pay | Admitting: Emergency Medicine

## 2022-06-15 ENCOUNTER — Ambulatory Visit: Payer: Medicare PPO | Admitting: Cardiovascular Disease

## 2022-06-15 ENCOUNTER — Encounter: Payer: Self-pay | Admitting: Cardiovascular Disease

## 2022-06-15 ENCOUNTER — Encounter: Payer: Self-pay | Admitting: Anesthesiology

## 2022-06-15 ENCOUNTER — Other Ambulatory Visit: Payer: Self-pay | Admitting: Cardiology

## 2022-06-15 ENCOUNTER — Ambulatory Visit (HOSPITAL_COMMUNITY)
Admission: RE | Admit: 2022-06-15 | Discharge: 2022-06-15 | Disposition: A | Discharge status: Home / Self Care | Payer: Medicare PPO | Source: Ambulatory Visit | Attending: Cardiovascular Disease | Admitting: Cardiovascular Disease

## 2022-06-15 VITALS — BP 121/75 | HR 65 | Temp 97.7°F | Resp 18 | Ht 62.0 in | Wt 118.7 lb

## 2022-06-15 DIAGNOSIS — Z01818 Encounter for other preprocedural examination: Secondary | ICD-10-CM

## 2022-06-15 DIAGNOSIS — J439 Emphysema, unspecified: Secondary | ICD-10-CM | POA: Insufficient documentation

## 2022-06-15 DIAGNOSIS — I340 Nonrheumatic mitral (valve) insufficiency: Secondary | ICD-10-CM | POA: Diagnosis not present

## 2022-06-15 DIAGNOSIS — I951 Orthostatic hypotension: Secondary | ICD-10-CM | POA: Insufficient documentation

## 2022-06-15 DIAGNOSIS — R9431 Abnormal electrocardiogram [ECG] [EKG]: Secondary | ICD-10-CM | POA: Diagnosis not present

## 2022-06-15 DIAGNOSIS — I059 Rheumatic mitral valve disease, unspecified: Secondary | ICD-10-CM | POA: Diagnosis not present

## 2022-06-15 DIAGNOSIS — R911 Solitary pulmonary nodule: Secondary | ICD-10-CM | POA: Diagnosis not present

## 2022-06-15 DIAGNOSIS — I517 Cardiomegaly: Secondary | ICD-10-CM | POA: Diagnosis not present

## 2022-06-15 DIAGNOSIS — Z95818 Presence of other cardiac implants and grafts: Secondary | ICD-10-CM | POA: Diagnosis not present

## 2022-06-15 DIAGNOSIS — I700 Atherosclerosis of aorta: Secondary | ICD-10-CM | POA: Insufficient documentation

## 2022-06-15 DIAGNOSIS — Z9889 Other specified postprocedural states: Secondary | ICD-10-CM | POA: Diagnosis not present

## 2022-06-15 DIAGNOSIS — G20 Parkinson's disease: Secondary | ICD-10-CM | POA: Diagnosis not present

## 2022-06-15 DIAGNOSIS — Z20822 Contact with and (suspected) exposure to covid-19: Secondary | ICD-10-CM | POA: Diagnosis not present

## 2022-06-15 DIAGNOSIS — I719 Aortic aneurysm of unspecified site, without rupture: Secondary | ICD-10-CM | POA: Insufficient documentation

## 2022-06-15 LAB — COMPREHENSIVE METABOLIC PANEL
ALT: <5 U/L (ref 0–44)
AST: 16 U/L (ref 15–41)
Albumin: 4 g/dL (ref 3.5–5.0)
Alkaline Phosphatase: 62 U/L (ref 38–126)
Anion gap: 11 (ref 5–15)
BUN: 12 mg/dL (ref 8–23)
CO2: 29 mmol/L (ref 22–32)
Calcium: 9.1 mg/dL (ref 8.9–10.3)
Chloride: 101 mmol/L (ref 98–111)
Creatinine, Ser: 0.79 mg/dL (ref 0.44–1.00)
GFR, Estimated: >60 mL/min (ref >60)
Glucose, Bld: 96 mg/dL (ref 70–99)
Potassium: 3 mmol/L — ABNORMAL LOW (ref 3.5–5.1)
Sodium: 141 mmol/L (ref 135–145)
Total Bilirubin: 1.7 mg/dL — ABNORMAL HIGH (ref 0.3–1.2)
Total Protein: 7.3 g/dL (ref 6.5–8.1)

## 2022-06-15 LAB — URINALYSIS, ROUTINE W REFLEX MICROSCOPIC
Bacteria, UA: NONE SEEN
Bilirubin Urine: NEGATIVE
Glucose, UA: NEGATIVE mg/dL
Hgb urine dipstick: NEGATIVE
Ketones, ur: NEGATIVE mg/dL
Nitrite: NEGATIVE
Protein, ur: NEGATIVE mg/dL
Specific Gravity, Urine: 1.008 (ref 1.005–1.030)
pH: 6 (ref 5.0–8.0)

## 2022-06-15 LAB — CBC
HCT: 40.4 % (ref 36.0–46.0)
Hemoglobin: 12.5 g/dL (ref 12.0–15.0)
MCH: 29.2 pg (ref 26.0–34.0)
MCHC: 30.9 g/dL (ref 30.0–36.0)
MCV: 94.4 fL (ref 80.0–100.0)
Platelets: 148 10*3/uL — ABNORMAL LOW (ref 150–400)
RBC: 4.28 MIL/uL (ref 3.87–5.11)
RDW: 14 % (ref 11.5–15.5)
WBC: 4.8 10*3/uL (ref 4.0–10.5)
nRBC: 0 % (ref 0.0–0.2)

## 2022-06-15 LAB — TYPE AND SCREEN
ABO/RH(D): O POS
Antibody Screen: NEGATIVE

## 2022-06-15 LAB — SURGICAL PCR SCREEN
MRSA, PCR: NEGATIVE
Staphylococcus aureus: POSITIVE — AB

## 2022-06-15 LAB — PROTIME-INR
INR: 1.1 (ref 0.8–1.2)
Prothrombin Time: 13.8 seconds (ref 11.4–15.2)

## 2022-06-15 LAB — SARS CORONAVIRUS 2 (TAT 6-24 HRS): SARS Coronavirus 2: NEGATIVE

## 2022-06-15 MED ORDER — POTASSIUM CHLORIDE CRYS ER 20 MEQ PO TBCR: 20.0000 meq | EXTENDED_RELEASE_TABLET | Freq: Every day | ORAL | 1 refills | Status: DC

## 2022-06-15 NOTE — Progress Notes (Signed)
Cardiology Office Note:    Date:  06/15/2022   ID:  Crystal Bass, DOB 1941-11-06, MRN [id number]  PCP:  [name], MD   [facility] ProvidersCardiologist:  [name], MD     Referring MD: [name], MD   Chief Complaint  Patient presents with   Shortness of Breath    History of Present Illness:    Crystal Bass is a 81 y.o. female presenting for evaluation of severe mitral regurgitation.  The patient is well-known to me.  She has a history of pulmonary embolus, Parkinson's disease complicated by orthostatic hypotension, [name] infection with chronic lung disease, and severe mitral regurgitation status post TEER of the mitral valve 02/15/2022.  The patient had progressive fatigue and dyspnea over a 38-monthtime period, underwent TEE and cardiac catheterization studies confirming severe mitral regurgitation with a severe Barlow valve involving anterior leaflet prolapse.  She underwent a complex MitraClip procedure, ultimately being treated with 2 XT W devices, reducing mitral regurgitation from 4+ at baseline to 1+ post procedure.  Her postoperative day #1 echo study showed mild to moderate mitral regurgitation.  She had early recurrence of her exertional dyspnea with a more pronounced MR murmur and she underwent repeat echo showing stable MitraClip position but worsening of mitral regurgitation.  Ultimately, her 1 month echocardiogram showed moderate eccentric mitral regurgitation and she remained highly symptomatic.  There was extensive valve team review and discussion of her case.  We ultimately referred her for repeat transesophageal echo which showed recurrent severe mitral regurgitation related to a persistent flail A2 segment lateral to the 2 clips.  The mean transmitral gradient is 5 to 6 mmHg.  The patient is here with her close friend today to discuss further treatment options.  She remains highly symptomatic with shortness of breath associated with any  low-level physical activity.  At rest she feels okay.  She denies orthopnea or PND.  She has no chest pain.  She continues to struggle with fatigue and exercise intolerance.  She would like to explore any treatment option that might help her quality of life.  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   Anginal pain ([facility]    Aortic root aneurysm (HCC)    Arthritis    Asthma    Breast cancer ([facility] 2002   infiltrative ductal carcinoma    Cataract 2019   COPD (chronic obstructive pulmonary disease) ([facility] 07/2007   Ductal carcinoma of breast, estrogen receptor positive, stage 1 ([facility] 09/16/2012   Dyspnea    Endometriosis    GERD (gastroesophageal reflux disease)    Hypercalcemia 09/14/2013   Hypertension    Lesion of breast 1992   right, benign   Lung disease    secondary to [name] infection   Mastalgia 05/1994   Osteoporosis, post-menopausal 03/14/2012   Ovarian tumor of borderline malignancy, right 2004   Pancreatitis    secondary to Cholelithiasis   Parkinson disease ([facility] 02/10/2015   Post-thoracotomy pain syndrome    Pseudomonas pneumonia ([facility] 03/2008   Status post implantation of mitral valve leaflet clip 02/15/2022   s/p XTW MitraClip x2 by Dr. CBurt Knackin the A2/P2 position   Traumatic closed fracture of distal clavicle with minimal displacement, left, initial encounter 06/2021   [facility]   Uterine fibroid    Vitamin D deficiency     Past Surgical History:  Procedure Laterality Date   ABDOMINAL  HYSTERECTOMY     & BSO for Mucinous borderline tumor of R ovary 2004   APPENDECTOMY  2004   BREAST BIOPSY  08/2004   right breast-benign   BREAST LUMPECTOMY  1992   benign   BUBBLE STUDY  12/27/2021   Procedure: BUBBLE STUDY;  Surgeon: Geralynn Rile, MD;  Location[facility]COPY;  Service: Cardiovascular;;   CARDIAC CATHETERIZATION  2007   essentially negation for significant CAD   CHOLECYSTECTOMY  2001    COLONOSCOPY  2014   Dr [name] , due 2019   COLONOSCOPY  09/2018   2 TA removed, int hem, no f/u needed (Dr [name])    ESOPHAGOGASTRODUODENOSCOPY  09/2018   GERD with esophagitis and stricture dilated, antral gastritis negative for H pylori [name])   LUNG BIOPSY  2002   [name], Dr [name]   MASTECTOMY MODIFIED RADICAL Left 2002   oral chemotheraphy (tamoxifen then Armidex) no radiation, Dr.[name]   MITRAL VALVE REPAIR N/A 02/15/2022   Procedure: MITRAL VALVE REPAIR;  Surgeon: Sherren Mocha, MD;  Location: [place] CV LAB;  Service: Cardiovascular;  Laterality: N/A;   RIGHT/LEFT HEART CATH AND CORONARY ANGIOGRAPHY Bilateral 11/14/2021   Procedure: RIGHT/LEFT HEART CATH AND CORONARY ANGIOGRAPHY;  Surgeon: Wellington Hampshire, MD;  Location: [place] CV LAB;  Service: Cardiovascular;  Laterality: Bilateral;   TEE WITHOUT CARDIOVERSION N/A 12/29/2018   Procedure: TRANSESOPHAGEAL ECHOCARDIOGRAM (TEE);  Surgeon: Wellington Hampshire, MD;  Location: [facility] ORS;  Service: Cardiovascular;  Laterality: N/A;   TEE WITHOUT CARDIOVERSION N/A 12/27/2021   Procedure: TRANSESOPHAGEAL ECHOCARDIOGRAM (TEE);  Surgeon: Geralynn Rile, MD;  Location: [place];  Service: Cardiovascular;  Laterality: N/A;   TEE WITHOUT CARDIOVERSION N/A 02/15/2022   Procedure: TRANSESOPHAGEAL ECHOCARDIOGRAM (TEE);  Surgeon: Sherren Mocha, MD;  Location: [place] CV LAB;  Service: Cardiovascular;  Laterality: N/A;   TEE WITHOUT CARDIOVERSION N/A 05/14/2022   Procedure: TRANSESOPHAGEAL ECHOCARDIOGRAM (TEE);  Surgeon: Geralynn Rile, MD;  Location: [place];  Service: Cardiovascular;  Laterality: N/A;   TONSILLECTOMY  1946    Current Medications: Current Meds  Medication Sig   acetaminophen (TYLENOL) 500 MG tablet Take 2 tablets (1,000 mg total) by mouth 2 (two) times daily as needed.   albuterol (VENTOLIN HFA) 108 (90 Base) MCG/ACT inhaler Inhale 2 puffs into the lungs every 6 (six) hours as needed for  wheezing or shortness of breath.   amLODipine (NORVASC) 2.5 MG tablet TAKE 1 TABLET BY MOUTH ONCE A DAY   amoxicillin (AMOXIL) 500 MG capsule Take 4 capsules (2,000 mg total) by mouth as directed. Take 4 tablets 1 hour prior to dental work, including cleanings.   aspirin 81 MG tablet Take 1 tablet (81 mg total) by mouth daily.   benzocaine-menthol (CHLORASEPTIC) 6-10 MG lozenge Take 1 lozenge by mouth as needed for sore throat.   Carbidopa-Levodopa ER (SINEMET CR) 25-100 MG tablet controlled release TAKE 2 TABLET BY MOUTH AT 9 AM, 1 TAB AT1 PM AND 1 TAB AT 5 PM   clonazePAM (KLONOPIN) 0.5 MG tablet TAKE 1/2 A TABLET BY MOUTH AT BEDTIME   EPINEPHrine 0.3 mg/0.3 mL IJ SOAJ injection Inject 0.3 mg into the muscle as needed for anaphylaxis.   fexofenadine (ALLEGRA) 180 MG tablet Take 180 mg by mouth daily.   Fluticasone-Umeclidin-Vilant (TRELEGY ELLIPTA) 100-62.5-25 MCG/ACT AEPB Inhale 1 puff into the lungs daily.   furosemide (LASIX) 20 MG tablet Take 1 tablet (20 mg total) by mouth daily.   gabapentin ([name]) 300 MG capsule TAKE 2  CAPSULES BY MOUTH 3 TIMES DAILY   pantoprazole (PROTONIX) 40 MG tablet Take 1 tablet (40 mg total) by mouth daily.     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  Tobacco Use   Smoking status: Never   Smokeless tobacco: Never  Vaping Use   Vaping Use: Never used  Substance and Sexual Activity   Alcohol use: No    Alcohol/week: 0.0 standard drinks of alcohol    Drug use: No   Sexual activity: Not Currently    Birth control/protection: Surgical    Comment: TAH/BSO  Other Topics Concern   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    Aunt of Dr Margaretmary Eddy    Lives independent living at [facility]    No children    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, 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, 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, 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)    Activity: walking about 1 mile/day, enjoys [employer]    Diet: some water, fruits/vegetables some    Right handed    Social Determinants of Health   Financial Resource Strain: Low Risk  (08/05/2021)   Overall Financial Resource Strain (CARDIA)    Difficulty of Paying Living Expenses: Not hard at all  Food Insecurity: No Food Insecurity (08/05/2021)   Hunger Vital Sign    Worried About Running Out of Food in the Last Year: Never true    [place] in the Last Year: Never true  Transportation Needs: No Transportation Needs (08/05/2021)   PRAPARE - Hydrologist (Medical): No    Lack of Transportation (Non-Medical): No  Physical Activity: Inactive (08/05/2021)   Exercise Vital Sign    Days of Exercise per Week: 0 days    Minutes of Exercise per Session: 0 min  Stress: No Stress Concern Present (08/05/2021)   [place]    Feeling of Stress : Not at all  Social Connections: Socially Isolated (08/05/2021)   Social Connection and Isolation Panel [NHANES]    Frequency of Communication with Friends and Family: More than three times a week    Frequency of Social Gatherings with Friends and Family: Three times a week    Attends Religious Services: Never    Active Member of Clubs or Organizations: No    Attends Archivist Meetings: Never    Marital Status: Widowed     Family History: The patient's family history includes Breast cancer in her cousin; Breast cancer (age of onset: 65) in her sister; Cancer in her maternal grandfather, maternal grandmother,  maternal uncle, maternal uncle, and maternal uncle; Colon cancer in her sister; Colon cancer (age of onset: 47) in her cousin; Emphysema in her mother; Liver cancer in her maternal aunt; Lung cancer (age of onset: 62) in her cousin; Lung cancer (age of onset: 34) in her father; Melanoma in her maternal aunt; Myasthenia gravis in her paternal aunt; Osteoporosis in her paternal aunt; Parkinson's disease in her cousin; Stroke in her paternal aunt. There is no history of Diabetes, Heart disease, Stomach  cancer, or Ulcerative colitis.  ROS:   Please see the history of present illness.    All other systems reviewed and are negative.  EKGs/Labs/Other Studies Reviewed:    The following studies were reviewed today: TEE: 1. Two Mitraclips are attached to the edges of the mitral leaflets. It is  difficult to analyze the two clips separately, but the lateral clip has  relatively more diastolic motion. At least one of the clips has evidence  of good tissue bridge. Cannot  exclude partial detachment of the lateral clip from the anterior leaflet  of the mitral valve. Diastolic gradients are exaggerated by the presence  of severe mitral insufficiency. The mitral valve is myxomatous. Moderate  to severe mitral valve  regurgitation. Mild mitral stenosis. The mean mitral valve gradient is 5.9  mmHg with average heart rate of 66 bpm. There is a Mitra-Clip present in  the mitral position. Procedure Date: 02/15/2022.   2. Left ventricular ejection fraction, by estimation, is 65 to 70%. The  left ventricle has normal function. The left ventricle has no regional  wall motion abnormalities. There is mild left ventricular hypertrophy of  the basal-septal segment. Left  ventricular diastolic function could not be evaluated. Elevated left  atrial pressure.   3. Right ventricular systolic function is normal. The right ventricular  size is normal. There is mildly elevated pulmonary artery systolic  pressure. The  estimated right ventricular systolic pressure is 76.1 mmHg.   4. Left atrial size was severely dilated.   5. Right atrial size was mildly dilated.   6. The tricuspid valve is myxomatous. Tricuspid valve regurgitation is  mild to moderate.   7. The aortic valve is tricuspid. Aortic valve regurgitation is trivial.  No aortic stenosis is present.   8. The inferior vena cava is dilated in size with >50% respiratory  variability, suggesting right atrial pressure of 8 mmHg.   Comparison(s): Prior images reviewed side by side. Compared to images from  02/16/2022, there is worsened mitral insufficiency, with an eccentric  posteriorly directed jet, originating lateral to the more lateral  MitraClip.   Recent Labs: 02/12/2022: ALT <5; B Natriuretic Peptide 317.9 03/23/2022: TSH 0.84 04/30/2022: BUN 12; Creatinine, Ser 0.73; NT-Pro BNP 403; Potassium 4.0; Sodium 140 06/15/2022: Hemoglobin 12.5; Platelets 148  Recent Lipid Panel    Component Value Date/Time   CHOL 171 08/08/2021 1320   TRIG 100.0 08/08/2021 1320   HDL 53.30 08/08/2021 1320   CHOLHDL 3 08/08/2021 1320   VLDL 20.0 08/08/2021 1320   LDLCALC 97 08/08/2021 1320   LDLDIRECT 135.0 08/16/2009 1359     Risk Assessment/Calculations:           Physical Exam:    VS:  BP 124/80   Pulse 69   Ht '5\' 2"'$  (1.575 m)   Wt 119 lb 9.6 oz (54.3 kg)   SpO2 97%   BMI 21.88 kg/m     Wt Readings from Last 3 Encounters:  06/15/22 118 lb 11.2 oz (53.8 kg)  06/15/22 119 lb 9.6 oz (54.3 kg)  06/04/22 121 lb (54.9 kg)     GEN:  Well nourished, well developed pleasant elderly woman in no acute distress HEENT: Normal NECK: No JVD; No carotid bruits LYMPHATICS: No lymphadenopathy CARDIAC: RRR, 3/6 holosystolic murmur at the apex RESPIRATORY:  Clear to auscultation without rales, wheezing or rhonchi  ABDOMEN: Soft, non-tender, non-distended MUSCULOSKELETAL:  No edema; No deformity  SKIN: Warm and dry NEUROLOGIC:  Alert and oriented x  3  PSYCHIATRIC:  Normal affect   ASSESSMENT:    1. Mitral valve disease   2. Severe mitral regurgitation   3. S/P mitral valve clip implantation    PLAN:    In order of problems listed above:  The patient has severe, symptomatic mitral regurgitation related to myxomatous mitral valve disease with severe anterior prolapse/flail.  She has undergone transcatheter edge-to-edge repair of the mitral valve using 2 MitraClip XT W devices.  She now has recurrent severe mitral regurgitation with an anterior flail segment lateral to the MitraClip devices.  I have spent extensive time reviewing her original procedure.  She was initially treated with an XT W clip followed by an attempt to deploy an NT W clip which would not grasp the leaflets.  A second XT W clip was deployed with good reduction in mitral regurgitation.  There was still leaflet mobility lateral to the second clip, but the mean transvalvular gradient was 5 mmHg and a third clip was not placed because of adequate reduction in mitral regurgitation and concern for creating mitral stenosis.  The patient went on to develop worsening of her mitral regurgitation now associated with New York Heart Association functional class III symptoms.  Her quality of life is markedly impaired and she is unable to do many of the her normal activities because of breathlessness.  She has been evaluated by pulmonology and most of her symptoms are felt to be cardiac.  We had a lengthy discussion today about consideration of a repeat MitraClip procedure.  She understands the potential risks of the procedure which have been previously reviewed with her at length.  We discussed these risks, in addition we discussed specific issues around a repeat procedure.  I think the biggest risk to her is creation of mitral stenosis.  It is difficult to predict how much her gradient will increase with placement of a third MitraClip device.  The other question is whether we can get good  leaflet capture with a smaller clip device such as an NT, potentially leaving her with a lower gradient.  I have reviewed her case with Dr. Johnsie Cancel.  Our procedural plan would be to start with a smaller device to see if we could get leaflet capture and assess gradient at that time.  The patient understands that the risks of the procedure include stroke, myocardial infarction, vascular injury, bleeding, cardiac injury with tamponade, leaflet injury with worsening mitral regurgitation, device embolization, endocarditis, arrhythmia, and death.  She understands that these risks occur at a low frequency.  We again reviewed the potential risk of mitral stenosis and the fact that even with good reduction in mitral regurgitation, we may not be able to leave the clip device if the gradients are too high.  We discussed other options including continued palliative medical therapy, and referral to a tertiary center for consideration of high risk surgery.  After shared decision-making conversation, the patient would like to proceed with a repeat MitraClip procedure.  Full informed consent is obtained.  The patient will be scheduled for surgery next week.           Medication Adjustments/Labs and Tests Ordered: Current medicines are reviewed at length with the patient today.  Concerns regarding medicines are outlined above.  No orders of the defined types were placed in this encounter.  No orders of the defined types were placed in this encounter.   Patient Instructions  Medication Instructions:  Your physician recommends that you continue on your current medications as directed.  Please refer to the Current Medication list given to you today.  *If you need a refill on your cardiac medications before your next appointment, please call your pharmacy*   Lab Work: NONE If you have labs (blood work) drawn today and your tests are completely normal, you will receive your results only by: [place] (if you  have MyChart) OR A paper copy in the mail If you have any lab test that is abnormal or we need to change your treatment, we will call you to review the results.   Testing/Procedures: Pre-admission testing at [facility] today   Follow-Up: At [facility], you and your health needs are our priority.  As part of our continuing mission to provide you with exceptional heart care, we have created designated Provider Care Teams.  These Care Teams include your primary Cardiologist (physician) and Advanced Practice Providers (APPs -  Physician Assistants and Nurse Practitioners) who all work together to provide you with the care you need, when you need it.  Your next appointment:   Structural team will follow up ( 1 week, 1 month w/ECHO, 1 year w/ECHO)  The format for your next appointment:   In Person  Provider:   Sherren Mocha, MD {     Important Information About Sugar         Signed, Sherren Mocha, MD  06/15/2022 11:37 AM    [place]

## 2022-06-15 NOTE — Patient Instructions (Signed)
Medication Instructions:  Your physician recommends that you continue on your current medications as directed. Please refer to the Current Medication list given to you today.  *If you need a refill on your cardiac medications before your next appointment, please call your pharmacy*   Lab Work: NONE If you have labs (blood work) drawn today and your tests are completely normal, you will receive your results only by: [place] (if you have MyChart) OR A paper copy in the mail If you have any lab test that is abnormal or we need to change your treatment, we will call you to review the results.   Testing/Procedures: Pre-admission testing at [facility] today   Follow-Up: At [facility], you and your health needs are our priority.  As part of our continuing mission to provide you with exceptional heart care, we have created designated Provider Care Teams.  These Care Teams include your primary Cardiologist (physician) and Advanced Practice Providers (APPs -  Physician Assistants and Nurse Practitioners) who all work together to provide you with the care you need, when you need it.  Your next appointment:   Structural team will follow up ( 1 week, 1 month w/ECHO, 1 year w/ECHO)  The format for your next appointment:   In Person  Provider:   Sherren Mocha, MD {     Important Information About Sugar

## 2022-06-15 NOTE — Progress Notes (Signed)
STS Risk calculation:   Isolated MVR Risk of Mortality: 3.647% Renal Failure: 2.573% Permanent Stroke: 2.253% Prolonged Ventilation: 9.214% DSW Infection: 0.040% Reoperation: 7.199% Morbidity or Mortality: 14.802% Short Length of Stay: 21.664% Long Length of Stay: 7.739%   MVR Risk of Mortality: 1.313% Renal Failure: 1.122% Permanent Stroke: 2.107% Prolonged Ventilation: 4.271% DSW Infection: 0.021% Reoperation: 3.496% Morbidity or Mortality: 7.845% Short Length of Stay: 35.673% Long Length of Stay: 3.229%

## 2022-06-15 NOTE — Progress Notes (Signed)
PCP - [name], MD Cardiologist - [name] and Sherren Mocha  PPM/ICD - Denies    Chest x-ray - 06/15/22 EKG - 06/15/22 Stress Test - "It's been years" ECHO - 05/14/22 Cardiac Cath - 02/15/22  Sleep Study - Denies  DM - Denies  Aspirin Instructions:Per patient instructed by Dr. Antionette Char office to continue aspirin through day of surgery  COVID TEST- 06/15/22   Anesthesia review: Yes cardiac history  Patient has shortness of breath at baseline, denies fever, cough and chest pain at PAT appointment   All instructions explained to the patient, with a verbal understanding of the material. Patient agrees to go over the instructions while at home for a better understanding. Patient also instructed to wear a mask while in public after being tested for COVID-19. The opportunity to ask questions was provided.

## 2022-06-15 NOTE — Telephone Encounter (Signed)
  [place] VALVE TEAM  Patient seen by Dr. Burt Knack today with plans for repeat MitraClip implant next Tuesday, 7/25. Labs returned with K+ at 3.0 therefore the patient was called and instructed to start [place] 105mq daily. Will follow BMET while hospitalized. Patient understands and agrees with plan.    JKathyrn DrownNP-C Structural Heart Team  Pager: [phone]: [phone]

## 2022-06-18 ENCOUNTER — Telehealth: Payer: Self-pay | Admitting: Cardiovascular Disease

## 2022-06-18 DIAGNOSIS — I059 Rheumatic mitral valve disease, unspecified: Secondary | ICD-10-CM

## 2022-06-18 DIAGNOSIS — E876 Hypokalemia: Secondary | ICD-10-CM

## 2022-06-18 NOTE — Telephone Encounter (Signed)
I spoke with the pt and after much thought over the weekend she has decided not to proceed with MitraClip at this time.  I made the pt aware that I will cancel her procedure tomorrow and that she can contact the structural team in the future if she decides to reconsider moving forward with procedure.  Pt agreed with plan.  The pt's potassium was noted to be 3.0 on PAT labs and Kathyrn Drown NP prescribed a potassium supplement.  Initially the plan was to check her potassium at time of procedure.  I made the pt aware that she will need follow up lab work since starting this medication (per pt started on Sunday, 7/23) and she will have a BMP checked on 7/31.

## 2022-06-18 NOTE — Anesthesia Preprocedure Evaluation (Deleted)
Anesthesia Evaluation    Airway        Dental   Pulmonary           Cardiovascular hypertension,      Neuro/Psych    GI/Hepatic   Endo/Other    Renal/GU      Musculoskeletal   Abdominal   Peds  Hematology   Anesthesia Other Findings   Reproductive/Obstetrics                             Anesthesia Physical Anesthesia Plan  ASA:   Anesthesia Plan:    Post-op Pain Management:    Induction:   PONV Risk Score and Plan:   Airway Management Planned:   Additional Equipment:   Intra-op Plan:   Post-operative Plan:   Informed Consent:   Plan Discussed with:   Anesthesia Plan Comments: (PAT note written 06/18/2022 by Myra Gianotti, PA-C. )        Anesthesia Quick Evaluation

## 2022-06-18 NOTE — Progress Notes (Signed)
Anesthesia Chart Review:  Case: [id number] Date/Time: 06/19/22 0730   Procedures:      MITRAL VALVE REPAIR     TRANSESOPHAGEAL ECHOCARDIOGRAM (TEE)   Anesthesia type: General   Diagnosis: Mitral valve insufficiency, unspecified etiology [I34.0]   Pre-op diagnosis: severe mitral insufficiency   Location: [facility] CATH LAB 6 / [place] INVASIVE CV LAB   Providers: Sherren Mocha, MD       DISCUSSION: [name] scheduled for the above procedure. History as below and includes PE (10/26/18), Parkinson's disease complicated by orthostatic hypotension, AMI infection with chronic lung disease, and severe MR s/p MitraClip x2 on 02/15/22. Recent TEE showed recurrent severe eccentric mitral regurgitation related to a persistent flail A2 segment. Above procedure planned.   Preoperative labs showed K 3.0, started on KCl 20 mEq per cardiology. They plan to follow BMET while hospitalized.   Anesthesia team to evaluate on the day of procedure.  06/15/2022 preoperative COVID-19 test negative.   VS: BP 121/75   Pulse 65   Temp 36.5 C (Oral)   Resp 18   Ht '5\' 2"'$  (1.575 m)   Wt 53.8 kg   SpO2 98%   BMI 21.71 kg/m    PROVIDERS: [name], MD is PCP  [name], MD is primary cardiologist Sherren Mocha, MD is structural heart cardiologist Tat, Wells Guiles, DO is neurologist Brand Males, MD is pulmonologist   LABS: Preoperative labs noted. See DISCUSSION. (all labs ordered are listed, but only abnormal results are displayed)  Labs Reviewed  SURGICAL PCR SCREEN - Abnormal; Notable for the following components:      Result Value   Staphylococcus aureus POSITIVE (*)    All other components within normal limits  CBC - Abnormal; Notable for the following components:   Platelets 148 (*)    All other components within normal limits  COMPREHENSIVE METABOLIC PANEL - Abnormal; Notable for the following components:   Potassium 3.0 (*)    Total Bilirubin 1.7 (*)    All other  components within normal limits  URINALYSIS, ROUTINE W REFLEX MICROSCOPIC - Abnormal; Notable for the following components:   Color, Urine STRAW (*)    Leukocytes,Ua TRACE (*)    All other components within normal limits  SARS CORONAVIRUS 2 (TAT 6-24 HRS)  PROTIME-INR  TYPE AND SCREEN     IMAGES: CXR 06/15/22: IMPRESSION: 1. Improved aeration of the right upper lobe with persistent hazy airspace opacity. 2.  Emphysema (ICD10-J43.9).  CT Chest 02/03/22: IMPRESSION: 1. Slightly more conspicuous right lower and middle lobe tree-in-bud nodularity. Interval development of left lower lobe tree-in-bud nodularities Findings suggestive of atypical infection. 2. Stable 1.1 x 0.6 cm right lower lobe centrally calcified pulmonary nodule. 3. Grossly similar-appearing right upper lobe postoperative changes with architectural distortion and volume loss. No definite new mass or lesion identified. 4. Cardiomegaly. 5. Aortic Atherosclerosis (ICD10-I70.0) and Emphysema (ICD10-J43.9). - CTA [facility] 09/11/21 findings included:  Stable uncomplicated fusiform ectasia of the ascending thoracic aorta and mild aneurysmal dilatation of the proximal aspect of the aortic arch, measuring 39 and 35 mm in diameter respectively, both unchanged compared to 2004 examination by my direct remeasurement. Aortic aneurysm NOS (ICD10-I71.9).   EKG: 06/15/22: Normal sinus rhythm Left ventricular hypertrophy with repolarization abnormality ( Sokolow-Lyon , Romhilt-Estes ) Abnormal ECG When compared with ECG of 16-Feb-2022 07:16, PREVIOUS ECG IS PRESENT No significant change since last tracing Confirmed by Daneen Schick [phone]) on 06/15/2022 5:33:45 PM   CV: TEE 05/14/22: IMPRESSIONS  1. S/p mitraclip with 2 XTW clips in the A2-P2 position. There is  recurrent severe eccentric mitral regurgitation related to a persistent  flail A2 segment. Suspect the mechanism is the flail segment is no longer  fully captured.  The jet is now lateral to   the current clips and posteriorly directed. The clips are attached  without detachment. MG 5-6 mmHG @ 65 bpm. The lateral orifice is 3.10 cm2  and the medial orifice is 1.61 cm2. The mitral valve has been  repaired/replaced. Severe mitral valve  regurgitation. Mild mitral stenosis. There is a Mitra-Clip present in the  mitral position. Procedure Date: 02/15/2022.   2. Left ventricular ejection fraction, by estimation, is 65 to 70%. The  left ventricle has normal function.   3. Right ventricular systolic function is normal. The right ventricular  size is normal.   4. Left atrial size was severely dilated. No left atrial/left atrial  appendage thrombus was detected.   5. 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 L to R  shunting seen. Evidence of atrial level shunting detected by color flow  Doppler.   6. Right atrial size was mild to moderately dilated.   7. The aortic valve is tricuspid. Aortic valve regurgitation is not  visualized. No aortic stenosis is present.    Long Term Monitor 03/22/22-04/05/22: Patch Wear Time:  13 days and 19 hours (2023-04-27T14:23:18-0400 to 2023-05-11T09:37:39-0400) Patient had a min HR of 52 bpm, max HR of 171 bpm, and avg HR of 67 bpm.  Predominant underlying rhythm was Sinus Rhythm. 2 Ventricular Tachycardia runs occurred, the run with the fastest interval lasting 9 beats with a max rate of 167 bpm (avg 146 bpm); the run with the fastest interval was also the longest.  35 Supraventricular Tachycardia runs occurred, the run with the fastest interval lasting 6 beats with a max rate of 171 bpm, the longest lasting 19 beats with an avg rate of 109 bpm. Occasional PVCs with a burden of 1.7%.   RHC/LHC 11/14/21 (pre-MitraClip):   The left ventricular systolic function is normal.   LV end diastolic pressure is mildly elevated.   The left ventricular ejection fraction is 55-65% by visual estimate.   1.  Normal  coronary arteries. 2.  Normal left ventricular systolic function. 3.  Moderate to severe mitral regurgitation by left ventricular angiography and severe by hemodynamic evaluation 4.  Right heart catheterization showed mildly elevated filling pressures, moderate pulmonary hypertension and normal cardiac output.  Giant V waves on PCW consistent with severe mitral regurgitation.   RA: 10 / 7 mmHg RV: 42/4 mmHg PA: 47/21 with a mean of 35 mmHg PCW: A wave is 19 mmHg, V wave is 34 mmHg and mean is 21 mmHg Cardiac output is 4.68 with a cardiac index of 3.03.   Recommendations: Recommend referral to the valve clinic for evaluation of mitral valve repair.  Due to Parkinson's disease and reduced functional capacity, she might not be an ideal candidate for surgical repair.  Past Medical History:  Diagnosis Date   Aneurysm of aorta ([place])    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   Anginal pain ([place])    Aortic root aneurysm ([facility])    Arthritis    Asthma    Breast cancer ([place]) 2002   infiltrative ductal carcinoma    Cataract 2019   COPD (chronic obstructive pulmonary disease) ([place]) 07/2007   Ductal carcinoma of breast, estrogen receptor positive, stage 1 ([place]) 09/16/2012  Dyspnea    Endometriosis    GERD (gastroesophageal reflux disease)    Hypercalcemia 09/14/2013   Hypertension    Lesion of breast 1992   right, benign   Lung disease    secondary to [name] infection   Mastalgia 05/1994   Osteoporosis, post-menopausal 03/14/2012   Ovarian tumor of borderline malignancy, right 2004   Pancreatitis    secondary to Cholelithiasis   Parkinson disease ([place]) 02/10/2015   Post-thoracotomy pain syndrome    Pseudomonas pneumonia ([place]) 03/2008   Status post implantation of mitral valve leaflet clip 02/15/2022   s/p XTW MitraClip x2 by Dr. Burt Knack in the A2/P2 position   Traumatic closed fracture of distal clavicle with minimal displacement, left, initial encounter 06/2021   [facility]   Uterine  fibroid    Vitamin D deficiency     Past Surgical History:  Procedure Laterality Date   ABDOMINAL HYSTERECTOMY     & BSO for Mucinous borderline tumor of R ovary 2004   APPENDECTOMY  2004   BREAST BIOPSY  08/2004   right breast-benign   BREAST LUMPECTOMY  1992   benign   BUBBLE STUDY  12/27/2021   Procedure: BUBBLE STUDY;  Surgeon: Geralynn Rile, MD;  Location: [facility] ENDOSCOPY;  Service: Cardiovascular;;   CARDIAC CATHETERIZATION  2007   essentially negation for significant CAD   CHOLECYSTECTOMY  2001   COLONOSCOPY  2014   Dr [name] , due 2019   COLONOSCOPY  09/2018   2 TA removed, int hem, no f/u needed (Dr [name])    ESOPHAGOGASTRODUODENOSCOPY  09/2018   GERD with esophagitis and stricture dilated, antral gastritis negative for H pylori [name])   LUNG BIOPSY  2002   [name], Dr [name]   MASTECTOMY MODIFIED RADICAL Left 2002   oral chemotheraphy (tamoxifen then Armidex) no radiation, Dr.[name]   MITRAL VALVE REPAIR N/A 02/15/2022   Procedure: MITRAL VALVE REPAIR;  Surgeon: Sherren Mocha, MD;  Location: [place] CV LAB;  Service: Cardiovascular;  Laterality: N/A;   RIGHT/LEFT HEART CATH AND CORONARY ANGIOGRAPHY Bilateral 11/14/2021   Procedure: RIGHT/LEFT HEART CATH AND CORONARY ANGIOGRAPHY;  Surgeon: Wellington Hampshire, MD;  Location: [place] CV LAB;  Service: Cardiovascular;  Laterality: Bilateral;   TEE WITHOUT CARDIOVERSION N/A 12/29/2018   Procedure: TRANSESOPHAGEAL ECHOCARDIOGRAM (TEE);  Surgeon: Wellington Hampshire, MD;  Location: [facility] ORS;  Service: Cardiovascular;  Laterality: N/A;   TEE WITHOUT CARDIOVERSION N/A 12/27/2021   Procedure: TRANSESOPHAGEAL ECHOCARDIOGRAM (TEE);  Surgeon: Geralynn Rile, MD;  Location: [place];  Service: Cardiovascular;  Laterality: N/A;   TEE WITHOUT CARDIOVERSION N/A 02/15/2022   Procedure: TRANSESOPHAGEAL ECHOCARDIOGRAM (TEE);  Surgeon: Sherren Mocha, MD;  Location: [place] CV LAB;  Service: Cardiovascular;   Laterality: N/A;   TEE WITHOUT CARDIOVERSION N/A 05/14/2022   Procedure: TRANSESOPHAGEAL ECHOCARDIOGRAM (TEE);  Surgeon: Geralynn Rile, MD;  Location: [place];  Service: Cardiovascular;  Laterality: N/A;   TONSILLECTOMY  1946    MEDICATIONS:  acetaminophen (TYLENOL) 500 MG tablet   albuterol (VENTOLIN HFA) 108 (90 Base) MCG/ACT inhaler   amLODipine (NORVASC) 2.5 MG tablet   amoxicillin (AMOXIL) 500 MG capsule   aspirin 81 MG tablet   benzocaine-menthol (CHLORASEPTIC) 6-10 MG lozenge   Carbidopa-Levodopa ER (SINEMET CR) 25-100 MG tablet controlled release   clonazePAM (KLONOPIN) 0.5 MG tablet   EPINEPHrine 0.3 mg/0.3 mL IJ SOAJ injection   fexofenadine (ALLEGRA) 180 MG tablet   Fluticasone-Umeclidin-Vilant (TRELEGY ELLIPTA) 100-62.5-25 MCG/ACT AEPB   furosemide (LASIX) 20 MG tablet  gabapentin ([name]) 300 MG capsule   pantoprazole (PROTONIX) 40 MG tablet   potassium chloride SA (KLOR-CON M) 20 MEQ tablet   No current facility-administered medications for this encounter.    Myra Gianotti, PA-C Surgical Short Stay/Anesthesiology [facility] Phone [phone] [facility] Phone [phone] 06/18/2022 9:39 AM

## 2022-06-18 NOTE — Telephone Encounter (Signed)
Pt would like a callback in regards to canceling scheduled procedure for tomorrow. Please advise

## 2022-06-19 ENCOUNTER — Inpatient Hospital Stay (HOSPITAL_COMMUNITY): Admission: RE | Admit: 2022-06-19 | Payer: Medicare PPO | Source: Home / Self Care | Admitting: Cardiovascular Disease

## 2022-06-19 ENCOUNTER — Encounter (HOSPITAL_COMMUNITY): Admission: RE | Payer: Medicare PPO | Source: Home / Self Care

## 2022-06-19 DIAGNOSIS — I340 Nonrheumatic mitral (valve) insufficiency: Secondary | ICD-10-CM

## 2022-06-19 SURGERY — MITRAL VALVE REPAIR
Anesthesia: General

## 2022-06-19 NOTE — Addendum Note (Signed)
Addended by: Janan Halter F on: 06/19/2022 05:10 PM   Modules accepted: Orders

## 2022-07-17 DIAGNOSIS — M79641 Pain in right hand: Secondary | ICD-10-CM | POA: Insufficient documentation

## 2022-08-02 ENCOUNTER — Ambulatory Visit: Payer: Medicare PPO | Admitting: Internal Medicine

## 2022-08-09 ENCOUNTER — Encounter: Payer: Self-pay | Admitting: Cardiovascular Disease

## 2022-08-09 ENCOUNTER — Ambulatory Visit: Payer: Medicare PPO | Attending: Cardiovascular Disease | Admitting: Cardiovascular Disease

## 2022-08-09 VITALS — BP 110/70 | HR 62 | Ht 62.0 in | Wt 120.0 lb

## 2022-08-09 DIAGNOSIS — I951 Orthostatic hypotension: Secondary | ICD-10-CM

## 2022-08-09 DIAGNOSIS — Z9889 Other specified postprocedural states: Secondary | ICD-10-CM

## 2022-08-09 DIAGNOSIS — G20 Parkinson's disease: Secondary | ICD-10-CM

## 2022-08-09 DIAGNOSIS — I10 Essential (primary) hypertension: Secondary | ICD-10-CM | POA: Diagnosis not present

## 2022-08-09 DIAGNOSIS — Z95818 Presence of other cardiac implants and grafts: Secondary | ICD-10-CM | POA: Diagnosis not present

## 2022-08-09 DIAGNOSIS — I341 Nonrheumatic mitral (valve) prolapse: Secondary | ICD-10-CM | POA: Diagnosis not present

## 2022-08-09 MED ORDER — AMLODIPINE BESYLATE 2.5 MG PO TABS: 2.5000 mg | ORAL_TABLET | Freq: Every day | ORAL | 3 refills | Status: DC

## 2022-08-09 NOTE — Progress Notes (Signed)
Cardiology Office Note   Date:  08/09/2022   ID:  MYRIKAL Bass, DOB 12-26-1940, MRN [id number]  PCP:  [name], MD  Cardiologist:   [name], MD   Chief Complaint  Patient presents with   Other    4 month f/u discuss mitral valve. Meds reviewed verbally with pt.      History of Present Illness: Crystal Bass is a 81 y.o. female who is here today for follow-up visit regarding mitral regurgitation status post mitral valve clip.   She has chronic medical conditions that include mitral valve prolapse with regurgitation, exertional dyspnea and orthostatic hypotension in the setting of Parkinson's disease. She was diagnosed with Parkinson's in 2015 . She used to be on multiple blood pressure medications which were discontinued due to orthostatic hypotension.     She was hospitalized in early December, 2019 with atypical chest pain.  Initial CTA of the chest showed single small pulmonary embolism in the apical segmental branch of the left upper lobe.  There was also evidence of bronchiectasis.  Repeat CT after 2 days showed resolution of the pulmonary embolus.  The patient was treated with Eliquis for 3 months.   She had worsening exertional dyspnea in 2022.   She underwent an echocardiogram in October 2022 which showed normal LV systolic function, grade 2 diastolic dysfunction, small pericardial effusion and moderate to severe mitral regurgitation due to significant prolapse of the anterior mitral leaflet.  She has history of COPD but she never smoked.   Pulmonary function testing showed minimal obstructive disease.  A right and left cardiac catheterization was done in March which showed normal coronary arteries and normal LV systolic function.  Mitral regurgitation was moderate to severe by ventricular angiography and severe by hemodynamic evaluation.  Right heart catheterization showed mildly elevated filling pressures, moderate pulmonary hypertension and normal  cardiac output.  Giant V waves were noted on pulmonary capillary wedge tracings (34 mm Hg).  She underwent mitral valve clip procedure in March.  However, she had minimal improvement in symptoms since then and repeat echocardiogram showed evidence of severe mitral regurgitation confirmed by TEE.  She saw Dr. Burt Knack with plans to proceed with placement of a third mitral valve clip knowing that there is an increased risk of mitral valve stenosis.  The procedure was scheduled in July but the patient canceled the day before.  She is very hesitant about undergoing another procedure and wants to be treated medically for now.  She continues to have class III [place] heart association symptoms.  No chest pain.  Her dizziness improved with amlodipine.  Past Medical History:  Diagnosis Date   Aneurysm of aorta ([place])    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   Anginal pain ([place])    Aortic root aneurysm (H[facility])    Arthritis    Asthma    Breast cancer ([place]) 2002   infiltrative ductal carcinoma    Cataract 2019   COPD (chronic obstructive pulmonary disease) ([place]) 07/2007   Ductal carcinoma of breast, estrogen receptor positive, stage 1 ([place]) 09/16/2012   Dyspnea    Endometriosis    GERD (gastroesophageal reflux disease)    Hypercalcemia 09/14/2013   Hypertension    Lesion of breast 1992   right, benign   Lung disease    secondary to [name] infection   Mastalgia 05/1994   Osteoporosis, post-menopausal 03/14/2012   Ovarian tumor of borderline malignancy, right 2004   Pancreatitis  secondary to Cholelithiasis   Parkinson disease ([place]) 02/10/2015   Post-thoracotomy pain syndrome    Pseudomonas pneumonia ([place]) 03/2008   Status post implantation of mitral valve leaflet clip 02/15/2022   s/p XTW MitraClip x2 by Dr. Burt Knack in the A2/P2 position   Traumatic closed fracture of distal clavicle with minimal displacement, left, initial encounter 06/2021   [facility]   Uterine fibroid    Vitamin D deficiency      Past Surgical History:  Procedure Laterality Date   ABDOMINAL HYSTERECTOMY     & BSO for Mucinous borderline tumor of R ovary 2004   APPENDECTOMY  2004   BREAST BIOPSY  08/2004   right breast-benign   BREAST LUMPECTOMY  1992   benign   BUBBLE STUDY  12/27/2021   Procedure: BUBBLE STUDY;  Surgeon: Geralynn Rile, MD;  Location: [facility] ENDOSCOPY;  Service: Cardiovascular;;   CARDIAC CATHETERIZATION  2007   essentially negation for significant CAD   CHOLECYSTECTOMY  2001   COLONOSCOPY  2014   Dr [name] , due 2019   COLONOSCOPY  09/2018   2 TA removed, int hem, no f/u needed (Dr [name])    ESOPHAGOGASTRODUODENOSCOPY  09/2018   GERD with esophagitis and stricture dilated, antral gastritis negative for H pylori [name])   LUNG BIOPSY  2002   [name], Dr [name]   MASTECTOMY MODIFIED RADICAL Left 2002   oral chemotheraphy (tamoxifen then Armidex) no radiation, Dr.[name]   MITRAL VALVE REPAIR N/A 02/15/2022   Procedure: MITRAL VALVE REPAIR;  Surgeon: Sherren Mocha, MD;  Location: [place] CV LAB;  Service: Cardiovascular;  Laterality: N/A;   RIGHT/LEFT HEART CATH AND CORONARY ANGIOGRAPHY Bilateral 11/14/2021   Procedure: RIGHT/LEFT HEART CATH AND CORONARY ANGIOGRAPHY;  Surgeon: Wellington Hampshire, MD;  Location: [employer] CV LAB;  Service: Cardiovascular;  Laterality: Bilateral;   TEE WITHOUT CARDIOVERSION N/A 12/29/2018   Procedure: TRANSESOPHAGEAL ECHOCARDIOGRAM (TEE);  Surgeon: Wellington Hampshire, MD;  Location: [facility] ORS;  Service: Cardiovascular;  Laterality: N/A;   TEE WITHOUT CARDIOVERSION N/A 12/27/2021   Procedure: TRANSESOPHAGEAL ECHOCARDIOGRAM (TEE);  Surgeon: Geralynn Rile, MD;  Location: [place];  Service: Cardiovascular;  Laterality: N/A;   TEE WITHOUT CARDIOVERSION N/A 02/15/2022   Procedure: TRANSESOPHAGEAL ECHOCARDIOGRAM (TEE);  Surgeon: Sherren Mocha, MD;  Location: [place] CV LAB;  Service: Cardiovascular;  Laterality: N/A;   TEE WITHOUT  CARDIOVERSION N/A 05/14/2022   Procedure: TRANSESOPHAGEAL ECHOCARDIOGRAM (TEE);  Surgeon: Geralynn Rile, MD;  Location: [place];  Service: Cardiovascular;  Laterality: N/A;   TONSILLECTOMY  1946     Current Outpatient Medications  Medication Sig Dispense Refill   acetaminophen (TYLENOL) 500 MG tablet Take 2 tablets (1,000 mg total) by mouth 2 (two) times daily as needed.     albuterol (VENTOLIN HFA) 108 (90 Base) MCG/ACT inhaler Inhale 2 puffs into the lungs every 6 (six) hours as needed for wheezing or shortness of breath. 18 g 1   amLODipine (NORVASC) 2.5 MG tablet TAKE 1 TABLET BY MOUTH ONCE A DAY 30 tablet 3   amoxicillin (AMOXIL) 500 MG capsule Take 4 capsules (2,000 mg total) by mouth as directed. Take 4 tablets 1 hour prior to dental work, including cleanings. 12 capsule 12   aspirin 81 MG tablet Take 1 tablet (81 mg total) by mouth daily. 30 tablet    benzocaine-menthol (CHLORASEPTIC) 6-10 MG lozenge Take 1 lozenge by mouth as needed for sore throat. 100 tablet 0   Carbidopa-Levodopa ER (SINEMET CR) 25-100 MG tablet controlled  release TAKE 2 TABLET BY MOUTH AT 9 AM, 1 TAB AT1 PM AND 1 TAB AT 5 PM 360 tablet 1   clonazePAM (KLONOPIN) 0.5 MG tablet TAKE 1/2 A TABLET BY MOUTH AT BEDTIME 45 tablet 1   EPINEPHrine 0.3 mg/0.3 mL IJ SOAJ injection Inject 0.3 mg into the muscle as needed for anaphylaxis.     fexofenadine (ALLEGRA) 180 MG tablet Take 180 mg by mouth daily.     Fluticasone-Umeclidin-Vilant (TRELEGY ELLIPTA) 100-62.5-25 MCG/ACT AEPB Inhale 1 puff into the lungs daily. 1 each 6   furosemide (LASIX) 20 MG tablet Take 1 tablet (20 mg total) by mouth daily. 90 tablet 3   gabapentin ([name]) 300 MG capsule TAKE 2 CAPSULES BY MOUTH 3 TIMES DAILY 540 capsule 1   pantoprazole (PROTONIX) 40 MG tablet Take 1 tablet (40 mg total) by mouth daily. 90 tablet 2   potassium chloride SA (KLOR-CON M) 20 MEQ tablet Take 1 tablet (20 mEq total) by mouth daily. 60 tablet 1   No  current facility-administered medications for this visit.    Allergies:   Sulfa antibiotics, Sulfonamide derivatives, Topamax [topiramate], Biaxin [clarithromycin], Hydrocodone, Motrin [ibuprofen], Symbicort [budesonide-formoterol fumarate], Percocet [oxycodone-acetaminophen], Tape, and Tetracyclines & related    Social History:  The patient  reports that she has never smoked. She has never used smokeless tobacco. She reports that she does not drink alcohol and does not use drugs.   Family History:  The patient's family history includes Breast cancer in her cousin; Breast cancer (age of onset: 51) in her sister; Cancer in her maternal grandfather, maternal grandmother, maternal uncle, maternal uncle, and maternal uncle; Colon cancer in her sister; Colon cancer (age of onset: 99) in her cousin; Emphysema in her mother; Liver cancer in her maternal aunt; Lung cancer (age of onset: 42) in her cousin; Lung cancer (age of onset: 88) in her father; Melanoma in her maternal aunt; Myasthenia gravis in her paternal aunt; Osteoporosis in her paternal aunt; Parkinson's disease in her cousin; Stroke in her paternal aunt.    ROS:  Please see the history of present illness.   Otherwise, review of systems are positive for none.   All other systems are reviewed and negative.    PHYSICAL EXAM: VS:  BP 110/70 (BP Location: Left Arm, Patient Position: Sitting, Cuff Size: Normal)   Pulse 62   Ht '5\' 2"'$  (1.575 m)   Wt 120 lb (54.4 kg)   SpO2 93%   BMI 21.95 kg/m  , BMI Body mass index is 21.95 kg/m. GEN: Well nourished, well developed, in no acute distress  HEENT: normal  Neck: no JVD, carotid bruits, or masses Cardiac: RRR; no rubs, or gallops,no edema .  2/6 holosystolic murmur at the apex and the left sternal border. Respiratory:  clear to auscultation bilaterally, normal work of breathing GI: soft, nontender, nondistended, + BS MS: no deformity or atrophy  Skin: warm and dry, no rash Neuro:  Strength  and sensation are intact Psych: euthymic mood, full affect   EKG:  EKG is not ordered today.    Recent Labs: 02/12/2022: B Natriuretic Peptide 317.9 03/23/2022: TSH 0.84 04/30/2022: NT-Pro BNP 403 06/15/2022: ALT <5; BUN 12; Creatinine, Ser 0.79; Hemoglobin 12.5; Platelets 148; Potassium 3.0; Sodium 141    Lipid Panel    Component Value Date/Time   CHOL 171 08/08/2021 1320   TRIG 100.0 08/08/2021 1320   HDL 53.30 08/08/2021 1320   CHOLHDL 3 08/08/2021 1320   VLDL 20.0 08/08/2021 1320  LDLCALC 97 08/08/2021 1320   LDLDIRECT 135.0 08/16/2009 1359      Wt Readings from Last 3 Encounters:  08/09/22 120 lb (54.4 kg)  06/15/22 118 lb 11.2 oz (53.8 kg)  06/15/22 119 lb 9.6 oz (54.3 kg)          No data to display            ASSESSMENT AND PLAN:  1.   Mitral valve prolapse : Status post mitral valve clip in March but unfortunately she has recurrent severe mitral regurgitation.  She elected not to proceed with another mitral valve clip.  She is currently [place] heart association class III.  Continue small dose furosemide with potassium. Prolonged discussion with her today about the risks and benefits of proceeding with another procedure.  2.  Orthostatic hypotension: Her symptoms seem to be improved compared to before.  3.  Essential hypertension: Blood pressure is controlled on small dose amlodipine.  4.  Parkinson's disease: Neurologic symptoms seem to be stable.    Disposition: Follow-up with me in 4 months.  Signed,  [name], MD  08/09/2022 2:41 PM    [place]

## 2022-08-09 NOTE — Patient Instructions (Signed)
Medication Instructions:  Your physician recommends that you continue on your current medications as directed. Please refer to the Current Medication list given to you today.  *If you need a refill on your cardiac medications before your next appointment, please call your pharmacy*   Lab Work: None ordered If you have labs (blood work) drawn today and your tests are completely normal, you will receive your results only by: [place] (if you have MyChart) OR A paper copy in the mail If you have any lab test that is abnormal or we need to change your treatment, we will call you to review the results.   Testing/Procedures: None ordered   Follow-Up: At [facility], you and your health needs are our priority.  As part of our continuing mission to provide you with exceptional heart care, we have created designated Provider Care Teams.  These Care Teams include your primary Cardiologist (physician) and Advanced Practice Providers (APPs -  Physician Assistants and Nurse Practitioners) who all work together to provide you with the care you need, when you need it.  We recommend signing up for the patient portal called "MyChart".  Sign up information is provided on this After Visit Summary.  MyChart is used to connect with patients for Virtual Visits (Telemedicine).  Patients are able to view lab/test results, encounter notes, upcoming appointments, etc.  Non-urgent messages can be sent to your provider as well.   To learn more about what you can do with MyChart, go to [email or web address].    Your next appointment:   4 month(s)  The format for your next appointment:   In Person  Provider:   You may see [name], MD or one of the following Advanced Practice Providers on your designated Care Team:   Murray Hodgkins, NP Christell Faith, PA-C Cadence Kathlen Mody, PA-C Gerrie Nordmann, NP    Other Instructions N/A  Important Information About Sugar

## 2022-08-13 ENCOUNTER — Ambulatory Visit (INDEPENDENT_AMBULATORY_CARE_PROVIDER_SITE_OTHER): Payer: Medicare PPO

## 2022-08-13 ENCOUNTER — Other Ambulatory Visit: Payer: Self-pay | Admitting: Family Medicine

## 2022-08-13 VITALS — Ht 62.0 in | Wt 120.0 lb

## 2022-08-13 DIAGNOSIS — J3081 Allergic rhinitis due to animal (cat) (dog) hair and dander: Secondary | ICD-10-CM | POA: Insufficient documentation

## 2022-08-13 DIAGNOSIS — Z1159 Encounter for screening for other viral diseases: Secondary | ICD-10-CM

## 2022-08-13 DIAGNOSIS — E559 Vitamin D deficiency, unspecified: Secondary | ICD-10-CM

## 2022-08-13 DIAGNOSIS — J4530 Mild persistent asthma, uncomplicated: Secondary | ICD-10-CM | POA: Insufficient documentation

## 2022-08-13 DIAGNOSIS — Z0000 Encounter for general adult medical examination without abnormal findings: Secondary | ICD-10-CM | POA: Diagnosis not present

## 2022-08-13 DIAGNOSIS — G20A1 Parkinson's disease without dyskinesia, without mention of fluctuations: Secondary | ICD-10-CM

## 2022-08-13 DIAGNOSIS — G20 Parkinson's disease: Secondary | ICD-10-CM

## 2022-08-13 DIAGNOSIS — J301 Allergic rhinitis due to pollen: Secondary | ICD-10-CM | POA: Insufficient documentation

## 2022-08-13 DIAGNOSIS — E538 Deficiency of other specified B group vitamins: Secondary | ICD-10-CM

## 2022-08-13 DIAGNOSIS — K769 Liver disease, unspecified: Secondary | ICD-10-CM

## 2022-08-13 NOTE — Patient Instructions (Signed)
Crystal Bass , Thank you for taking time to come for your Medicare Wellness Visit. I appreciate your ongoing commitment to your health goals. Please review the following plan we discussed and let me know if I can assist you in the future.   Screening recommendations/referrals: Colonoscopy: aged out Mammogram: aged out Bone Density: 04/06/22, every 2 years Recommended yearly ophthalmology/optometry visit for glaucoma screening and checkup Recommended yearly dental visit for hygiene and checkup  Vaccinations: Influenza vaccine: 11/21/21 Pneumococcal vaccine: 11/22/20 Tdap vaccine: 06/30/21 Shingles vaccine: Zostavax 05/26/07   Shingrix 02/22/18, 07/08/19   Covid-19:01/01/20, 01/20/20, 02/16/20, 07/25/20, 11/22/20  Advanced directives: yes  Conditions/risks identified: none  Next appointment: Follow up in one year for your annual wellness visit 08/15/23 @ 10 am by phone   Preventive Care 65 Years and Older, Female Preventive care refers to lifestyle choices and visits with your health care provider that can promote health and wellness. What does preventive care include? A yearly physical exam. This is also called an annual well check. Dental exams once or twice a year. Routine eye exams. Ask your health care provider how often you should have your eyes checked. Personal lifestyle choices, including: Daily care of your teeth and gums. Regular physical activity. Eating a healthy diet. Avoiding tobacco and drug use. Limiting alcohol use. Practicing safe sex. Taking low-dose aspirin every day. Taking vitamin and mineral supplements as recommended by your health care provider. What happens during an annual well check? The services and screenings done by your health care provider during your annual well check will depend on your age, overall health, lifestyle risk factors, and family history of disease. Counseling  Your health care provider may ask you questions about your: Alcohol  use. Tobacco use. Drug use. Emotional well-being. Home and relationship well-being. Sexual activity. Eating habits. History of falls. Memory and ability to understand (cognition). Work and work Statistician. Reproductive health. Screening  You may have the following tests or measurements: Height, weight, and BMI. Blood pressure. Lipid and cholesterol levels. These may be checked every 5 years, or more frequently if you are over 51 years old. Skin check. Lung cancer screening. You may have this screening every year starting at age 107 if you have a 30-pack-year history of smoking and currently smoke or have quit within the past 15 years. Fecal occult blood test (FOBT) of the stool. You may have this test every year starting at age 40. Flexible sigmoidoscopy or colonoscopy. You may have a sigmoidoscopy every 5 years or a colonoscopy every 10 years starting at age 73. Hepatitis C blood test. Hepatitis B blood test. Sexually transmitted disease (STD) testing. Diabetes screening. This is done by checking your blood sugar (glucose) after you have not eaten for a while (fasting). You may have this done every 1-3 years. Bone density scan. This is done to screen for osteoporosis. You may have this done starting at age 30. Mammogram. This may be done every 1-2 years. Talk to your health care provider about how often you should have regular mammograms. Talk with your health care provider about your test results, treatment options, and if necessary, the need for more tests. Vaccines  Your health care provider may recommend certain vaccines, such as: Influenza vaccine. This is recommended every year. Tetanus, diphtheria, and acellular pertussis (Tdap, Td) vaccine. You may need a Td booster every 10 years. Zoster vaccine. You may need this after age 83. Pneumococcal 13-valent conjugate (PCV13) vaccine. One dose is recommended after age 75. Pneumococcal polysaccharide (PPSV23)  vaccine. One dose is  recommended after age 41. Talk to your health care provider about which screenings and vaccines you need and how often you need them. This information is not intended to replace advice given to you by your health care provider. Make sure you discuss any questions you have with your health care provider. Document Released: 12/09/2015 Document Revised: 08/01/2016 Document Reviewed: 09/13/2015 Elsevier Interactive Patient Education  2017 [employer] Prevention in the Home Falls can cause injuries. They can happen to people of all ages. There are many things you can do to make your home safe and to help prevent falls. What can I do on the outside of my home? Regularly fix the edges of walkways and driveways and fix any cracks. Remove anything that might make you trip as you walk through a door, such as a raised step or threshold. Trim any bushes or trees on the path to your home. Use bright outdoor lighting. Clear any walking paths of anything that might make someone trip, such as rocks or tools. Regularly check to see if handrails are loose or broken. Make sure that both sides of any steps have handrails. Any raised decks and porches should have guardrails on the edges. Have any leaves, snow, or ice cleared regularly. Use sand or salt on walking paths during winter. Clean up any spills in your garage right away. This includes oil or grease spills. What can I do in the bathroom? Use night lights. Install grab bars by the toilet and in the tub and shower. Do not use towel bars as grab bars. Use non-skid mats or decals in the tub or shower. If you need to sit down in the shower, use a plastic, non-slip stool. Keep the floor dry. Clean up any water that spills on the floor as soon as it happens. Remove soap buildup in the tub or shower regularly. Attach bath mats securely with double-sided non-slip rug tape. Do not have throw rugs and other things on the floor that can make you  trip. What can I do in the bedroom? Use night lights. Make sure that you have a light by your bed that is easy to reach. Do not use any sheets or blankets that are too big for your bed. They should not hang down onto the floor. Have a firm chair that has side arms. You can use this for support while you get dressed. Do not have throw rugs and other things on the floor that can make you trip. What can I do in the kitchen? Clean up any spills right away. Avoid walking on wet floors. Keep items that you use a lot in easy-to-reach places. If you need to reach something above you, use a strong step stool that has a grab bar. Keep electrical cords out of the way. Do not use floor polish or wax that makes floors slippery. If you must use wax, use non-skid floor wax. Do not have throw rugs and other things on the floor that can make you trip. What can I do with my stairs? Do not leave any items on the stairs. Make sure that there are handrails on both sides of the stairs and use them. Fix handrails that are broken or loose. Make sure that handrails are as long as the stairways. Check any carpeting to make sure that it is firmly attached to the stairs. Fix any carpet that is loose or worn. Avoid having throw rugs at the top or bottom  of the stairs. If you do have throw rugs, attach them to the floor with carpet tape. Make sure that you have a light switch at the top of the stairs and the bottom of the stairs. If you do not have them, ask someone to add them for you. What else can I do to help prevent falls? Wear shoes that: Do not have high heels. Have rubber bottoms. Are comfortable and fit you well. Are closed at the toe. Do not wear sandals. If you use a stepladder: Make sure that it is fully opened. Do not climb a closed stepladder. Make sure that both sides of the stepladder are locked into place. Ask someone to hold it for you, if possible. Clearly mark and make sure that you can  see: Any grab bars or handrails. First and last steps. Where the edge of each step is. Use tools that help you move around (mobility aids) if they are needed. These include: Canes. Walkers. Scooters. Crutches. Turn on the lights when you go into a dark area. Replace any light bulbs as soon as they burn out. Set up your furniture so you have a clear path. Avoid moving your furniture around. If any of your floors are uneven, fix them. If there are any pets around you, be aware of where they are. Review your medicines with your doctor. Some medicines can make you feel dizzy. This can increase your chance of falling. Ask your doctor what other things that you can do to help prevent falls. This information is not intended to replace advice given to you by your health care provider. Make sure you discuss any questions you have with your health care provider. Document Released: 09/08/2009 Document Revised: 04/19/2016 Document Reviewed: 12/17/2014 Elsevier Interactive Patient Education  2017 [employer].

## 2022-08-13 NOTE — Progress Notes (Signed)
Virtual Visit via Telephone Note  I connected with  Crystal Bass on 08/13/22 at  1:00 PM EDT by telephone and verified that I am speaking with the correct person using two identifiers.  Location: Patient: home Provider: [place] Persons participating in the virtual visit: [place]   I discussed the limitations, risks, security and privacy concerns of performing an evaluation and management service by telephone and the availability of in person appointments. The patient expressed understanding and agreed to proceed.  Interactive audio and video telecommunications were attempted between this nurse and patient, however failed, due to patient having technical difficulties OR patient did not have access to video capability.  We continued and completed visit with audio only.  Some vital signs may be absent or patient reported.   Crystal David, LPN  Subjective:   Crystal Bass is a 81 y.o. female who presents for Medicare Annual (Subsequent) preventive examination.  Review of Systems     Cardiac Risk Factors include: advanced age (>66mn, >>24women);dyslipidemia;hypertension     Objective:    There were no vitals filed for this visit. There is no height or weight on file to calculate BMI.     08/13/2022    1:13 PM 06/15/2022   10:37 AM 05/14/2022   10:58 AM 04/17/2022    2:56 PM 03/12/2022    3:43 PM 02/16/2022   12:31 PM 02/12/2022   10:57 AM  Advanced Directives  Does Patient Have a Medical Advance Directive? Yes Yes Yes Yes Yes No Yes  Type of AParamedicof ALake HallieLiving will HPawnee RockLiving will HBradley GardensLiving will Living will;Out of facility DNR (pink MOST or yellow form);Healthcare Power of [place]landLiving will  Does patient want to make changes to medical advance directive? No - Patient declined No - Patient declined   No - Patient declined     Copy of HHudsonin Chart? Yes - validated most recent copy scanned in chart (See row information) No - copy requested Yes - validated most recent copy scanned in chart (See row information)      Would patient like information on creating a medical advance directive?      No - Patient declined     Current Medications (verified) Outpatient Encounter Medications as of 08/13/2022  Medication Sig   acetaminophen (TYLENOL) 500 MG tablet Take 2 tablets (1,000 mg total) by mouth 2 (two) times daily as needed.   albuterol (VENTOLIN HFA) 108 (90 Base) MCG/ACT inhaler Inhale 2 puffs into the lungs every 6 (six) hours as needed for wheezing or shortness of breath.   amLODipine (NORVASC) 2.5 MG tablet Take 1 tablet (2.5 mg total) by mouth daily.   aspirin 81 MG tablet Take 1 tablet (81 mg total) by mouth daily.   Carbidopa-Levodopa ER (SINEMET CR) 25-100 MG tablet controlled release TAKE 2 TABLET BY MOUTH AT 9 AM, 1 TAB AT1 PM AND 1 TAB AT 5 PM   clonazePAM (KLONOPIN) 0.5 MG tablet TAKE 1/2 A TABLET BY MOUTH AT BEDTIME   EPINEPHrine 0.3 mg/0.3 mL IJ SOAJ injection Inject 0.3 mg into the muscle as needed for anaphylaxis.   fexofenadine (ALLEGRA) 180 MG tablet Take 180 mg by mouth daily.   Fluticasone-Umeclidin-Vilant (TRELEGY ELLIPTA) 100-62.5-25 MCG/ACT AEPB Inhale 1 puff into the lungs daily.   furosemide (LASIX) 20 MG tablet Take 1 tablet (20 mg total) by mouth daily.  gabapentin ([name]) 300 MG capsule TAKE 2 CAPSULES BY MOUTH 3 TIMES DAILY   pantoprazole (PROTONIX) 40 MG tablet Take 1 tablet (40 mg total) by mouth daily.   potassium chloride SA (KLOR-CON M) 20 MEQ tablet Take 1 tablet (20 mEq total) by mouth daily.   amoxicillin (AMOXIL) 500 MG capsule Take 4 capsules (2,000 mg total) by mouth as directed. Take 4 tablets 1 hour prior to dental work, including cleanings. (Patient not taking: Reported on 08/13/2022)   benzocaine-menthol (CHLORASEPTIC) 6-10 MG lozenge Take 1 lozenge  by mouth as needed for sore throat. (Patient not taking: Reported on 08/13/2022)   No facility-administered encounter medications on file as of 08/13/2022.    Allergies (verified) Sulfa antibiotics, Sulfonamide derivatives, Topamax [topiramate], Clarithromycin, Hydrocodone, Motrin [ibuprofen], Symbicort [budesonide-formoterol fumarate], Percocet [oxycodone-acetaminophen], Tape, and Tetracyclines & related   History: Past Medical History:  Diagnosis Date   Aneurysm of aorta ([place])    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   Anginal pain ([place])    Aortic root aneurysm ([place])    Arthritis    Asthma    Breast cancer ([place]) 2002   infiltrative ductal carcinoma    Cataract 2019   COPD (chronic obstructive pulmonary disease) ([place]) 07/2007   Ductal carcinoma of breast, estrogen receptor positive, stage 1 ([place]) 09/16/2012   Dyspnea    Endometriosis    GERD (gastroesophageal reflux disease)    Hypercalcemia 09/14/2013   Hypertension    Lesion of breast 1992   right, benign   Lung disease    secondary to [name] infection   Mastalgia 05/1994   Osteoporosis, post-menopausal 03/14/2012   Ovarian tumor of borderline malignancy, right 2004   Pancreatitis    secondary to Cholelithiasis   Parkinson disease ([employer]) 02/10/2015   Post-thoracotomy pain syndrome    Pseudomonas pneumonia ([place]) 03/2008   Status post implantation of mitral valve leaflet clip 02/15/2022   s/p XTW MitraClip x2 by Dr. Burt Knack in the A2/P2 position   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   APPENDECTOMY  2004   BREAST BIOPSY  08/2004   right breast-benign   BREAST LUMPECTOMY  1992   benign   BUBBLE STUDY  12/27/2021   Procedure: BUBBLE STUDY;  Surgeon: Geralynn Rile, MD;  Location: [facility] ENDOSCOPY;  Service: Cardiovascular;;   CARDIAC CATHETERIZATION  2007   essentially negation for significant CAD   CHOLECYSTECTOMY  2001   COLONOSCOPY  2014   Dr [name] , due 2019   COLONOSCOPY  09/2018   2 TA removed, int hem, no f/u needed (Dr [name])    ESOPHAGOGASTRODUODENOSCOPY  09/2018   GERD with esophagitis and stricture dilated, antral gastritis negative for H pylori [name])   LUNG BIOPSY  2002   [name], Dr [name]   MASTECTOMY MODIFIED RADICAL Left 2002   oral chemotheraphy (tamoxifen then Armidex) no radiation, Dr.[name]   MITRAL VALVE REPAIR N/A 02/15/2022   Procedure: MITRAL VALVE REPAIR;  Surgeon: Sherren Mocha, MD;  Location: [place] CV LAB;  Service: Cardiovascular;  Laterality: N/A;   RIGHT/LEFT HEART CATH AND CORONARY ANGIOGRAPHY Bilateral 11/14/2021   Procedure: RIGHT/LEFT HEART CATH AND CORONARY ANGIOGRAPHY;  Surgeon: Wellington Hampshire, MD;  Location: [place]  CV LAB;  Service: Cardiovascular;  Laterality: Bilateral;   TEE WITHOUT CARDIOVERSION N/A 12/29/2018   Procedure: TRANSESOPHAGEAL ECHOCARDIOGRAM (TEE);  Surgeon: Wellington Hampshire, MD;  Location: [facility] ORS;  Service: Cardiovascular;  Laterality: N/A;   TEE WITHOUT CARDIOVERSION N/A 12/27/2021   Procedure: TRANSESOPHAGEAL ECHOCARDIOGRAM (TEE);  Surgeon: Geralynn Rile, MD;  Location: [place];  Service: Cardiovascular;  Laterality: N/A;   TEE WITHOUT CARDIOVERSION N/A 02/15/2022   Procedure: TRANSESOPHAGEAL ECHOCARDIOGRAM (TEE);  Surgeon: Sherren Mocha, MD;  Location: [place] CV LAB;  Service: Cardiovascular;  Laterality: N/A;   TEE WITHOUT CARDIOVERSION N/A 05/14/2022   Procedure: TRANSESOPHAGEAL ECHOCARDIOGRAM (TEE);  Surgeon: Geralynn Rile, MD;  Location: [place];  Service: Cardiovascular;  Laterality: N/A;   TONSILLECTOMY  1946   Family History  Problem Relation Age of Onset   Emphysema Mother        d.64 was never a smoker   Lung cancer Father 64        d.82 history of smoking   Breast cancer Sister 44   Colon cancer Sister    Melanoma Maternal Aunt    Liver cancer Maternal Aunt    Cancer Maternal Uncle        unspecified type   Cancer Maternal Uncle        unspecified type   Cancer Maternal Uncle        unspecified type   Stroke Paternal Aunt        in mid 66s   Osteoporosis Paternal Aunt    Myasthenia gravis Paternal Aunt    Cancer Maternal Grandmother        d.82s unspecified GI cancer   Cancer Maternal Grandfather        d.62s unspecified type   Breast cancer Cousin        d.60s-daughter of unaffected paternal aunt Eustace Moore   Colon cancer Cousin 86       d.70-daughter of unaffected maternal aunt Leila   Lung cancer Cousin 78       d.70-sisters to each other, both daughters of maternal uncle Johnny   Parkinson's disease Cousin    Diabetes Neg Hx    Heart disease Neg Hx    Stomach cancer Neg Hx    Ulcerative colitis Neg Hx    Social History   Socioeconomic History   Marital status: Widowed    Spouse name: Not on file   Number of children: 0   Years of education: Not on file   Highest education level: Not on file  Occupational History   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, 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, 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  Tobacco Use   Smoking status: Never   Smokeless tobacco: Never  Vaping Use   Vaping Use: Never used  Substance and Sexual Activity   Alcohol use: No    Alcohol/week: 0.0 standard drinks of alcohol   Drug use: No   Sexual activity: Not Currently    Birth control/protection: Surgical    Comment: TAH/BSO  Other Topics Concern   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    Aunt of Dr Margaretmary Eddy    L[facility] independent [facility] at [facility]    No children    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, 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, 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, 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)    Activity: walking about 1 mile/day, enjoys [employer]    Diet: some water, fruits/vegetables some    Right handed    Social Determinants of Systems developer Strain: Low Risk  (  08/13/2022)   Overall Financial Resource Strain (CARDIA)    Difficulty of Paying Living Expenses: Not hard at all  Food Insecurity: No Food Insecurity (08/13/2022)   Hunger Vital Sign    Worried About Running Out of Food in the Last Year: Never true    Ran Out of Food in the Last Year: Never true  Transportation Needs: No Transportation Needs (08/13/2022)   PRAPARE - Hydrologist (Medical): No    Lack of Transportation (Non-Medical): No  Physical Activity: Insufficiently Active (08/13/2022)   Exercise Vital Sign    Days of Exercise per Week: 4 days    Minutes of Exercise per Session: 30 min  Stress: No Stress Concern Present (08/13/2022)   [place]    Feeling of Stress : Not at all  Social Connections: Socially Isolated (08/13/2022)   Social Connection and Isolation Panel [NHANES]    Frequency of Communication with Friends and Family: More than three times a week    Frequency of Social Gatherings with Friends and Family: Once a week    Attends Religious Services: Never    Marine scientist or Organizations: No    Attends Archivist Meetings: Never    Marital Status: Widowed    Tobacco Counseling Counseling given: Not Answered   Clinical Intake:  Pre-visit preparation completed: Yes  Pain : No/denies pain     Nutritional Risks: None Diabetes: No  How often do you need to have someone help you when you read instructions, pamphlets, or other written materials from your doctor or pharmacy?: 1 - Never  Diabetic?no  Interpreter Needed?: No  Information entered by :: Kirke Shaggy, LPN   Activities of Daily Living    08/13/2022    1:14 PM 06/15/2022   10:41 AM  In your present state of health, do you have any difficulty performing the following activities:  Hearing? 0   Vision? 0   Difficulty concentrating or making decisions? 0    Walking or climbing stairs? 0   Dressing or bathing? 0   Doing errands, shopping? 0 0  Preparing Food and eating ? N   Using the Toilet? N   In the past six months, have you accidently leaked urine? N   Do you have problems with loss of bowel control? N   Managing your Medications? N   Managing your Finances? N   Housekeeping or managing your Housekeeping? N     Patient Care Team: [name], MD as PCP - General (Family Medicine) Wellington Hampshire, MD as PCP - Cardiology (Cardiology) Mosetta Anis, MD as Referring Physician (Allergy) Megan Salon, MD as Consulting Physician (Gynecology) Tat, Eustace Quail, DO as Consulting Physician (Neurology) Nicholas Lose, MD as Consulting Physician (Hematology and Oncology) Tat, Eustace Quail, DO as Consulting Physician (Neurology)  Indicate any recent Medical Services you may have received from other than [facility] providers in the past year (date may be approximate).     Assessment:   This is a routine wellness examination for [place].  Hearing/Vision screen Hearing Screening - Comments:: No aids Vision Screening - Comments:: Wears glasses- [facility]  Dietary issues and exercise activities discussed: Current Exercise Habits: Home exercise routine, Type of exercise: walking, Time (Minutes): 30, Frequency (Times/Week): 4, Weekly Exercise (Minutes/Week): 120, Intensity: Mild   Goals Addressed             This Visit's Progress    DIET -  EAT MORE FRUITS AND VEGETABLES         Depression Screen    08/13/2022    1:11 PM 05/31/2022    4:02 PM 05/09/2022    2:17 PM 04/16/2022    1:45 PM 03/21/2022    3:10 PM 08/08/2021   12:12 PM 06/14/2021   11:12 AM  PHQ 2/9 Scores  PHQ - 2 Score 2 0 0 1 3 0 0  PHQ- 9 Score '2  2 9 9      '$ Fall Risk    08/13/2022    1:14 PM 04/17/2022    2:56 PM 03/12/2022    3:36 PM 10/12/2021    3:23 PM 08/08/2021   12:12 PM  [place] in the past year? 0 0 '1 1 1  '$ Number falls in past yr: 0 0 0 0 1   Injury with Fall? 0 0 0 0 1  Comment     Lt clavicle fx  Risk for fall due to : No Fall Risks  Impaired balance/gait    Follow up Falls prevention discussed        FALL RISK PREVENTION PERTAINING TO THE HOME:  Any stairs in or around the home? No  If so, are there any without handrails? No  Home free of loose throw rugs in walkways, pet beds, electrical cords, etc? Yes  Adequate lighting in your home to reduce risk of falls? Yes   ASSISTIVE DEVICES UTILIZED TO PREVENT FALLS:  Life alert? No  Use of a cane, walker or w/c? Yes  Grab bars in the bathroom? Yes  Shower chair or bench in shower? Yes  Elevated toilet seat or a handicapped toilet? No    Cognitive Function:    01/19/2019   11:16 AM 01/14/2018   11:32 AM 06/12/2017    2:23 PM 12/27/2016    1:51 PM  MMSE - Mini Mental State Exam  Not completed:   Unable to complete   Orientation to time '5 5  5  '$ Orientation to Place '5 5  5  '$ Registration '3 3  3  '$ Attention/ Calculation 0 0  0  Recall '3 3  3  '$ Language- name 2 objects 0 0  0  Language- repeat '1 1  1  '$ Language- follow 3 step command '3 3  3  '$ Language- read & follow direction 0 0  0  Write a sentence 0 0  0  Copy design 0 0  0  Total score '20 20  20      '$ 10/02/2016   10:48 AM  Montreal Cognitive Assessment   Visuospatial/ Executive (0/5) 4  Naming (0/3) 3  Attention: Read list of digits (0/2) 2  Attention: Read list of letters (0/1) 1  Attention: Serial 7 subtraction starting at 100 (0/3) 3  Language: Repeat phrase (0/2) 2  Language : Fluency (0/1) 1  Abstraction (0/2) 2  Delayed Recall (0/5) 3  Orientation (0/6) 6  Total 27  Adjusted Score (based on education) 27      08/13/2022    1:16 PM 08/05/2021    1:16 PM  6CIT Screen  What Year? 0 points 0 points  What month? 0 points 0 points  What time? 0 points 0 points  Count back from 20 0 points 0 points  Months in reverse 0 points 0 points  Repeat phrase 2 points 0 points  Total Score 2 points 0 points     Immunizations Immunization History  Administered Date(s) Administered  Fluad Quad(high Dose 65+) 08/11/2019, 08/03/2020, 08/08/2021   Influenza Split 09/11/2011   Influenza Whole 09/09/2007, 08/08/2008, 08/15/2010, 08/26/2012   Influenza,inj,Quad PF,6+ Mos 08/18/2013, 08/20/2014, 08/25/2018   Influenza-Unspecified 09/27/2015, 08/26/2017   Moderna Sars-Covid-2 Vaccination 01/01/2020, 01/20/2020, 02/16/2020, 07/25/2020, 11/22/2020   Pneumococcal Conjugate-13 02/12/2014   Pneumococcal Polysaccharide-23 11/26/2006, 10/21/2012, 11/22/2020, 11/21/2021   Td 01/01/2011, 06/30/2021   Zoster Recombinat (Shingrix) 02/22/2018, 07/08/2019   Zoster, Live 05/26/2007    TDAP status: Up to date  Flu Vaccine status: Up to date  Pneumococcal vaccine status: Up to date  Covid-19 vaccine status: Completed vaccines  Qualifies for Shingles Vaccine? Yes   Zostavax completed Yes   Shingrix Completed?: Yes  Screening Tests Health Maintenance  Topic Date Due   Hepatitis C Screening  Never done   COVID-19 Vaccine (6 - Moderna risk series) 01/17/2021   MAMMOGRAM  06/14/2022   INFLUENZA VACCINE  06/26/2022   COLONOSCOPY (Pts 45-58yr Insurance coverage will need to be confirmed)  10/10/2023   TETANUS/TDAP  07/01/2031   Pneumonia Vaccine 79 Years old  Completed   DEXA SCAN  Completed   Zoster Vaccines- Shingrix  Completed   HPV VACCINES  Aged Out    Health Maintenance  Health Maintenance Due  Topic Date Due   Hepatitis C Screening  Never done   COVID-19 Vaccine (6 - Moderna risk series) 01/17/2021   MAMMOGRAM  06/14/2022   INFLUENZA VACCINE  06/26/2022    Colorectal cancer screening: No longer required.   Mammogram status: No longer required due to age.  Bone Density status: Completed 04/06/22. Results reflect: Bone density results: OSTEOPOROSIS. Repeat every 2 years.  Lung Cancer Screening: (Low Dose CT Chest recommended if Age 79-80years, 30 pack-year currently smoking OR have  quit w/in 15years.) does not qualify.   Additional Screening:  Hepatitis C Screening: does qualify; Completed no  Vision Screening: Recommended annual ophthalmology exams for early detection of glaucoma and other disorders of the eye. Is the patient up to date with their annual eye exam?  Yes  Who is the provider or what is the name of the office in which the patient attends annual eye exams? [facility]iseIf pt is not established with a provider, would they like to be referred to a provider to establish care? No .   Dental Screening: Recommended annual dental exams for proper oral hygiene  Community Resource Referral / Chronic Care Management: CRR required this visit?  No   CCM required this visit?  No      Plan:     I have personally reviewed and noted the following in the patient's chart:   Medical and social history Use of alcohol, tobacco or illicit drugs  Current medications and supplements including opioid prescriptions. Patient is not currently taking opioid prescriptions. Functional ability and status Nutritional status Physical activity Advanced directives List of other physicians Hospitalizations, surgeries, and ER visits in previous 12 months Vitals Screenings to include cognitive, depression, and falls Referrals and appointments  In addition, I have reviewed and discussed with patient certain preventive protocols, quality metrics, and best practice recommendations. A written personalized care plan for preventive services as well as general preventive health recommendations were provided to patient.     LDionisio David LPN   [id number]  Nurse Notes: none

## 2022-08-19 ENCOUNTER — Inpatient Hospital Stay
Admission: EM | Admit: 2022-08-19 | Discharge: 2022-08-24 | DRG: 521 | Disposition: A | Discharge status: Skilled Nursing Facility | Payer: Medicare PPO | Source: Skilled Nursing Facility | Attending: Internal Medicine | Admitting: Internal Medicine

## 2022-08-19 ENCOUNTER — Encounter: Payer: Self-pay | Admitting: Radiology

## 2022-08-19 ENCOUNTER — Emergency Department: Payer: Medicare PPO

## 2022-08-19 ENCOUNTER — Other Ambulatory Visit: Payer: Self-pay

## 2022-08-19 DIAGNOSIS — I10 Essential (primary) hypertension: Secondary | ICD-10-CM

## 2022-08-19 DIAGNOSIS — Z803 Family history of malignant neoplasm of breast: Secondary | ICD-10-CM

## 2022-08-19 DIAGNOSIS — J811 Chronic pulmonary edema: Secondary | ICD-10-CM | POA: Diagnosis not present

## 2022-08-19 DIAGNOSIS — I5033 Acute on chronic diastolic (congestive) heart failure: Secondary | ICD-10-CM | POA: Diagnosis not present

## 2022-08-19 DIAGNOSIS — I9581 Postprocedural hypotension: Secondary | ICD-10-CM | POA: Diagnosis not present

## 2022-08-19 DIAGNOSIS — G20A1 Parkinson's disease without dyskinesia, without mention of fluctuations: Secondary | ICD-10-CM

## 2022-08-19 DIAGNOSIS — S72001A Fracture of unspecified part of neck of right femur, initial encounter for closed fracture: Secondary | ICD-10-CM | POA: Diagnosis present

## 2022-08-19 DIAGNOSIS — E876 Hypokalemia: Secondary | ICD-10-CM | POA: Diagnosis present

## 2022-08-19 DIAGNOSIS — J479 Bronchiectasis, uncomplicated: Secondary | ICD-10-CM | POA: Diagnosis not present

## 2022-08-19 DIAGNOSIS — F419 Anxiety disorder, unspecified: Secondary | ICD-10-CM

## 2022-08-19 DIAGNOSIS — R918 Other nonspecific abnormal finding of lung field: Secondary | ICD-10-CM | POA: Diagnosis not present

## 2022-08-19 DIAGNOSIS — Z7951 Long term (current) use of inhaled steroids: Secondary | ICD-10-CM

## 2022-08-19 DIAGNOSIS — I340 Nonrheumatic mitral (valve) insufficiency: Secondary | ICD-10-CM | POA: Diagnosis present

## 2022-08-19 DIAGNOSIS — K219 Gastro-esophageal reflux disease without esophagitis: Secondary | ICD-10-CM | POA: Diagnosis present

## 2022-08-19 DIAGNOSIS — M6281 Muscle weakness (generalized): Secondary | ICD-10-CM | POA: Diagnosis not present

## 2022-08-19 DIAGNOSIS — Z825 Family history of asthma and other chronic lower respiratory diseases: Secondary | ICD-10-CM

## 2022-08-19 DIAGNOSIS — I110 Hypertensive heart disease with heart failure: Secondary | ICD-10-CM | POA: Diagnosis present

## 2022-08-19 DIAGNOSIS — Z881 Allergy status to other antibiotic agents status: Secondary | ICD-10-CM

## 2022-08-19 DIAGNOSIS — J449 Chronic obstructive pulmonary disease, unspecified: Secondary | ICD-10-CM | POA: Diagnosis not present

## 2022-08-19 DIAGNOSIS — S72002A Fracture of unspecified part of neck of left femur, initial encounter for closed fracture: Secondary | ICD-10-CM

## 2022-08-19 DIAGNOSIS — R001 Bradycardia, unspecified: Secondary | ICD-10-CM | POA: Diagnosis not present

## 2022-08-19 DIAGNOSIS — Z820 Family history of epilepsy and other diseases of the nervous system: Secondary | ICD-10-CM

## 2022-08-19 DIAGNOSIS — Z888 Allergy status to other drugs, medicaments and biological substances status: Secondary | ICD-10-CM

## 2022-08-19 DIAGNOSIS — Z882 Allergy status to sulfonamides status: Secondary | ICD-10-CM

## 2022-08-19 DIAGNOSIS — R0609 Other forms of dyspnea: Secondary | ICD-10-CM | POA: Diagnosis not present

## 2022-08-19 DIAGNOSIS — R0689 Other abnormalities of breathing: Secondary | ICD-10-CM | POA: Diagnosis not present

## 2022-08-19 DIAGNOSIS — I272 Pulmonary hypertension, unspecified: Secondary | ICD-10-CM | POA: Diagnosis present

## 2022-08-19 DIAGNOSIS — S72042A Displaced fracture of base of neck of left femur, initial encounter for closed fracture: Secondary | ICD-10-CM | POA: Diagnosis not present

## 2022-08-19 DIAGNOSIS — Z86711 Personal history of pulmonary embolism: Secondary | ICD-10-CM

## 2022-08-19 DIAGNOSIS — J9601 Acute respiratory failure with hypoxia: Secondary | ICD-10-CM | POA: Diagnosis present

## 2022-08-19 DIAGNOSIS — W010XXA Fall on same level from slipping, tripping and stumbling without subsequent striking against object, initial encounter: Secondary | ICD-10-CM | POA: Diagnosis present

## 2022-08-19 DIAGNOSIS — Z886 Allergy status to analgesic agent status: Secondary | ICD-10-CM

## 2022-08-19 DIAGNOSIS — Z91048 Other nonmedicinal substance allergy status: Secondary | ICD-10-CM

## 2022-08-19 DIAGNOSIS — Z471 Aftercare following joint replacement surgery: Secondary | ICD-10-CM | POA: Diagnosis not present

## 2022-08-19 DIAGNOSIS — Z7982 Long term (current) use of aspirin: Secondary | ICD-10-CM

## 2022-08-19 DIAGNOSIS — Z9889 Other specified postprocedural states: Secondary | ICD-10-CM

## 2022-08-19 DIAGNOSIS — W19XXXA Unspecified fall, initial encounter: Secondary | ICD-10-CM | POA: Diagnosis not present

## 2022-08-19 DIAGNOSIS — E559 Vitamin D deficiency, unspecified: Secondary | ICD-10-CM | POA: Diagnosis present

## 2022-08-19 DIAGNOSIS — G20 Parkinson's disease: Secondary | ICD-10-CM | POA: Diagnosis present

## 2022-08-19 DIAGNOSIS — Y9301 Activity, walking, marching and hiking: Secondary | ICD-10-CM | POA: Diagnosis present

## 2022-08-19 DIAGNOSIS — M810 Age-related osteoporosis without current pathological fracture: Secondary | ICD-10-CM | POA: Diagnosis present

## 2022-08-19 DIAGNOSIS — D62 Acute posthemorrhagic anemia: Secondary | ICD-10-CM | POA: Diagnosis not present

## 2022-08-19 DIAGNOSIS — R0902 Hypoxemia: Secondary | ICD-10-CM | POA: Diagnosis not present

## 2022-08-19 DIAGNOSIS — Z96642 Presence of left artificial hip joint: Secondary | ICD-10-CM | POA: Diagnosis not present

## 2022-08-19 DIAGNOSIS — Z90710 Acquired absence of both cervix and uterus: Secondary | ICD-10-CM

## 2022-08-19 DIAGNOSIS — Z170 Estrogen receptor positive status [ER+]: Secondary | ICD-10-CM

## 2022-08-19 DIAGNOSIS — Z6824 Body mass index (BMI) 24.0-24.9, adult: Secondary | ICD-10-CM | POA: Diagnosis not present

## 2022-08-19 DIAGNOSIS — M1990 Unspecified osteoarthritis, unspecified site: Secondary | ICD-10-CM | POA: Diagnosis present

## 2022-08-19 DIAGNOSIS — E440 Moderate protein-calorie malnutrition: Secondary | ICD-10-CM | POA: Diagnosis present

## 2022-08-19 DIAGNOSIS — S72002D Fracture of unspecified part of neck of left femur, subsequent encounter for closed fracture with routine healing: Secondary | ICD-10-CM | POA: Diagnosis not present

## 2022-08-19 DIAGNOSIS — Z01810 Encounter for preprocedural cardiovascular examination: Secondary | ICD-10-CM | POA: Diagnosis not present

## 2022-08-19 DIAGNOSIS — D696 Thrombocytopenia, unspecified: Secondary | ICD-10-CM | POA: Diagnosis present

## 2022-08-19 DIAGNOSIS — I2510 Atherosclerotic heart disease of native coronary artery without angina pectoris: Secondary | ICD-10-CM | POA: Diagnosis present

## 2022-08-19 DIAGNOSIS — Z9012 Acquired absence of left breast and nipple: Secondary | ICD-10-CM

## 2022-08-19 DIAGNOSIS — Z90722 Acquired absence of ovaries, bilateral: Secondary | ICD-10-CM

## 2022-08-19 DIAGNOSIS — E538 Deficiency of other specified B group vitamins: Secondary | ICD-10-CM | POA: Diagnosis present

## 2022-08-19 DIAGNOSIS — S72012A Unspecified intracapsular fracture of left femur, initial encounter for closed fracture: Secondary | ICD-10-CM | POA: Diagnosis not present

## 2022-08-19 DIAGNOSIS — Z853 Personal history of malignant neoplasm of breast: Secondary | ICD-10-CM

## 2022-08-19 DIAGNOSIS — J4489 Other specified chronic obstructive pulmonary disease: Secondary | ICD-10-CM | POA: Diagnosis present

## 2022-08-19 DIAGNOSIS — Z66 Do not resuscitate: Secondary | ICD-10-CM | POA: Diagnosis present

## 2022-08-19 DIAGNOSIS — Z9049 Acquired absence of other specified parts of digestive tract: Secondary | ICD-10-CM

## 2022-08-19 DIAGNOSIS — N179 Acute kidney failure, unspecified: Secondary | ICD-10-CM | POA: Diagnosis not present

## 2022-08-19 DIAGNOSIS — Q2543 Congenital aneurysm of aorta: Secondary | ICD-10-CM | POA: Diagnosis not present

## 2022-08-19 DIAGNOSIS — I5032 Chronic diastolic (congestive) heart failure: Secondary | ICD-10-CM | POA: Diagnosis present

## 2022-08-19 DIAGNOSIS — R1314 Dysphagia, pharyngoesophageal phase: Secondary | ICD-10-CM | POA: Diagnosis present

## 2022-08-19 DIAGNOSIS — Z9079 Acquired absence of other genital organ(s): Secondary | ICD-10-CM

## 2022-08-19 DIAGNOSIS — Z885 Allergy status to narcotic agent status: Secondary | ICD-10-CM

## 2022-08-19 DIAGNOSIS — I509 Heart failure, unspecified: Secondary | ICD-10-CM | POA: Diagnosis not present

## 2022-08-19 DIAGNOSIS — Z79899 Other long term (current) drug therapy: Secondary | ICD-10-CM

## 2022-08-19 DIAGNOSIS — Z9181 History of falling: Secondary | ICD-10-CM | POA: Diagnosis not present

## 2022-08-19 LAB — BASIC METABOLIC PANEL
Anion gap: 8 (ref 5–15)
BUN: 22 mg/dL (ref 8–23)
CO2: 30 mmol/L (ref 22–32)
Calcium: 8.8 mg/dL — ABNORMAL LOW (ref 8.9–10.3)
Chloride: 103 mmol/L (ref 98–111)
Creatinine, Ser: 1.21 mg/dL — ABNORMAL HIGH (ref 0.44–1.00)
GFR, Estimated: 46 mL/min — ABNORMAL LOW (ref >60)
Glucose, Bld: 118 mg/dL — ABNORMAL HIGH (ref 70–99)
Potassium: 3.6 mmol/L (ref 3.5–5.1)
Sodium: 141 mmol/L (ref 135–145)

## 2022-08-19 LAB — CBC WITH DIFFERENTIAL/PLATELET
Abs Immature Granulocytes: 0.05 10*3/uL (ref 0.00–0.07)
Basophils Absolute: 0.1 10*3/uL (ref 0.0–0.1)
Basophils Relative: 1 %
Eosinophils Absolute: 0.1 10*3/uL (ref 0.0–0.5)
Eosinophils Relative: 2 %
HCT: 37.9 % (ref 36.0–46.0)
Hemoglobin: 12 g/dL (ref 12.0–15.0)
Immature Granulocytes: 1 %
Lymphocytes Relative: 12 %
Lymphs Abs: 0.8 10*3/uL (ref 0.7–4.0)
MCH: 29.9 pg (ref 26.0–34.0)
MCHC: 31.7 g/dL (ref 30.0–36.0)
MCV: 94.3 fL (ref 80.0–100.0)
Monocytes Absolute: 0.5 10*3/uL (ref 0.1–1.0)
Monocytes Relative: 8 %
Neutro Abs: 5 10*3/uL (ref 1.7–7.7)
Neutrophils Relative %: 76 %
Platelets: 151 10*3/uL (ref 150–400)
RBC: 4.02 MIL/uL (ref 3.87–5.11)
RDW: 15 % (ref 11.5–15.5)
WBC: 6.5 10*3/uL (ref 4.0–10.5)
nRBC: 0 % (ref 0.0–0.2)

## 2022-08-19 MED ORDER — CARBIDOPA-LEVODOPA ER 25-100 MG PO TBCR: 1.0000 | EXTENDED_RELEASE_TABLET | ORAL | Status: DC

## 2022-08-19 MED ORDER — CARBIDOPA-LEVODOPA ER 25-100 MG PO TBCR
1.0000 | EXTENDED_RELEASE_TABLET | Freq: Two times a day (BID) | ORAL | Status: DC
  Administered 2022-08-20 – 2022-08-23 (×7): 1 via ORAL
  Filled 2022-08-19 (×10): qty 1

## 2022-08-19 MED ORDER — MAGNESIUM HYDROXIDE 400 MG/5ML PO SUSP: 30.0000 mL | Freq: Every day | ORAL | Status: DC | PRN

## 2022-08-19 MED ORDER — MORPHINE SULFATE (PF) 2 MG/ML IV SOLN
0.5000 mg | INTRAVENOUS | Status: DC | PRN
  Administered 2022-08-20 (×2): 0.5 mg via INTRAVENOUS
  Filled 2022-08-19 (×2): qty 1

## 2022-08-19 MED ORDER — EPINEPHRINE 0.3 MG/0.3ML IJ SOAJ: 0.3000 mg | INTRAMUSCULAR | Status: DC | PRN

## 2022-08-19 MED ORDER — AMLODIPINE BESYLATE 5 MG PO TABS: 2.5000 mg | ORAL_TABLET | Freq: Every day | ORAL | Status: DC

## 2022-08-19 MED ORDER — FUROSEMIDE 10 MG/ML IJ SOLN
40.0000 mg | Freq: Every day | INTRAMUSCULAR | Status: DC
  Administered 2022-08-20: 40 mg via INTRAVENOUS
  Filled 2022-08-19: qty 4

## 2022-08-19 MED ORDER — ACETAMINOPHEN 500 MG PO TABS
1000.0000 mg | ORAL_TABLET | Freq: Once | ORAL | Status: AC
Start: 2022-08-19 — End: 2022-08-19
  Administered 2022-08-19: 1000 mg via ORAL
  Filled 2022-08-19: qty 2

## 2022-08-19 MED ORDER — TRAZODONE HCL 50 MG PO TABS: 25.0000 mg | ORAL_TABLET | Freq: Every evening | ORAL | Status: DC | PRN

## 2022-08-19 MED ORDER — POTASSIUM CHLORIDE CRYS ER 20 MEQ PO TBCR: 20.0000 meq | EXTENDED_RELEASE_TABLET | Freq: Every day | ORAL | Status: DC

## 2022-08-19 MED ORDER — UMECLIDINIUM BROMIDE 62.5 MCG/ACT IN AEPB
1.0000 | INHALATION_SPRAY | Freq: Every day | RESPIRATORY_TRACT | Status: DC
  Administered 2022-08-20 – 2022-08-24 (×4): 1 via RESPIRATORY_TRACT
  Filled 2022-08-19: qty 7

## 2022-08-19 MED ORDER — FUROSEMIDE 40 MG PO TABS: 20.0000 mg | ORAL_TABLET | Freq: Every day | ORAL | Status: DC

## 2022-08-19 MED ORDER — ONDANSETRON HCL 4 MG/2ML IJ SOLN: 4.0000 mg | INTRAMUSCULAR | Status: DC | PRN

## 2022-08-19 MED ORDER — FLUTICASONE FUROATE-VILANTEROL 100-25 MCG/ACT IN AEPB
1.0000 | INHALATION_SPRAY | Freq: Every day | RESPIRATORY_TRACT | Status: DC
  Administered 2022-08-20 – 2022-08-24 (×4): 1 via RESPIRATORY_TRACT
  Filled 2022-08-19: qty 28

## 2022-08-19 MED ORDER — ALBUTEROL SULFATE HFA 108 (90 BASE) MCG/ACT IN AERS: 2.0000 | INHALATION_SPRAY | Freq: Four times a day (QID) | RESPIRATORY_TRACT | Status: DC | PRN

## 2022-08-19 MED ORDER — PANTOPRAZOLE SODIUM 40 MG PO TBEC: 40.0000 mg | DELAYED_RELEASE_TABLET | Freq: Every day | ORAL | Status: DC

## 2022-08-19 MED ORDER — ACETAMINOPHEN 325 MG PO TABS: 650.0000 mg | ORAL_TABLET | ORAL | Status: DC | PRN

## 2022-08-19 MED ORDER — TRAMADOL HCL 50 MG PO TABS: 50.0000 mg | ORAL_TABLET | Freq: Four times a day (QID) | ORAL | Status: DC | PRN

## 2022-08-19 MED ORDER — LORATADINE 10 MG PO TABS: 10.0000 mg | ORAL_TABLET | Freq: Every day | ORAL | Status: DC

## 2022-08-19 MED ORDER — GABAPENTIN 300 MG PO CAPS
600.0000 mg | ORAL_CAPSULE | Freq: Three times a day (TID) | ORAL | Status: DC
  Administered 2022-08-20 (×3): 600 mg via ORAL
  Filled 2022-08-19 (×3): qty 2

## 2022-08-19 MED ORDER — CARBIDOPA-LEVODOPA ER 25-100 MG PO TBCR
2.0000 | EXTENDED_RELEASE_TABLET | Freq: Every day | ORAL | Status: DC
  Administered 2022-08-20 – 2022-08-24 (×5): 2 via ORAL
  Filled 2022-08-19 (×5): qty 2

## 2022-08-19 MED ORDER — CLONAZEPAM 0.5 MG PO TABS
0.5000 mg | ORAL_TABLET | Freq: Every day | ORAL | Status: DC
  Administered 2022-08-20 – 2022-08-23 (×4): 0.5 mg via ORAL
  Filled 2022-08-19 (×5): qty 1

## 2022-08-19 MED ORDER — ALBUTEROL SULFATE (2.5 MG/3ML) 0.083% IN NEBU: 2.5000 mg | INHALATION_SOLUTION | Freq: Four times a day (QID) | RESPIRATORY_TRACT | Status: DC | PRN

## 2022-08-19 NOTE — ED Triage Notes (Signed)
Pt arrives from home via [facility]. Reports mechanical fall at approx 2000, fell onto left hip.  Denies dizziness/weakness. States pain in 8 knee to hip left side.  A&Ox4, NAD at this time.

## 2022-08-19 NOTE — H&P (Addendum)
Crystal Bass   PATIENT NAME: Crystal Bass    MR#:  [id number]  DATE OF BIRTH:  Feb 25, 1941  DATE OF ADMISSION:  08/19/2022  PRIMARY CARE PHYSICIAN: [name], MD   Patient is coming from: Home  REQUESTING/REFERRING PHYSICIAN: Thomasene Mohair, MD  CHIEF COMPLAINT:   Chief Complaint  Patient presents with   Fall    HISTORY OF PRESENT ILLNESS:  Crystal Bass is a 81 y.o. Caucasian female with medical history significant for  asthma, COPD,, hypertension, Parkinson's disease, and osteoarthritis who presented to the ER with acute onset of left hip pain after having an accidental mechanical fall.  She believes that she lost her balance while she was reaching down with subsequent fall and landing on her left hip.  She denied any head injury and denied pain elsewhere on her body.  No chest pain or palpitations.  No headache or dizziness or blurred vision.  No paresthesias or focal muscle weakness.  She has been having dyspnea on exertion and orthopnea without paroxysmal nocturnal dyspnea.  No worsening lower extremity edema.  No fever or chills.   She denies any cough or wheezing or hemoptysis.  No bleeding diathesis.  No dysuria, oliguria, hematuria or flank pain.  ED Course: When she came to the ER, respiratory rate was 26 BP was 139/90, pulse oximetry was initially 92 to 93% on room air and later 89% and pulse oximetry 96% on 2 L of O2 by nasal cannula and otherwise vital signs were within normal.  Labs revealed a creatinine 1.2 up from 0.79 in July of this year and calcium of 8.8.  CBC was within normal. EKG as reviewed by me : EKG showed sinus rhythm with a rate of 83 with borderline right axis deviation and LVH with Q waves anteroseptally and ST segment depression inferiorly. Imaging: Portable chest ray showed interval development of diffuse increased interstitial and airspace opacity with underlying architectural scarring and wedge resection in the right upper lobe.   Findings are likely presenting pulmonary edema and superimposed infection was not excluded.  Left hip x-ray showed acute displaced left femoral neck fracture that is poorly visualized.  Dr. Mack Guise was notified about the patient.  She received 1 g of p.o. Tylenol.  She will be admitted to a medical-surgical bed for further evaluation and management. 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   Anginal pain ([place])    Aortic root aneurysm ([facility])    Arthritis    Asthma    Breast cancer ([place]) 2002   infiltrative ductal carcinoma    Cataract 2019   COPD (chronic obstructive pulmonary disease) ([place]) 07/2007   Ductal carcinoma of breast, estrogen receptor positive, stage 1 ([place]) 09/16/2012   Dyspnea    Endometriosis    GERD (gastroesophageal reflux disease)    Hypercalcemia 09/14/2013   Hypertension    Lesion of breast 1992   right, benign   Lung disease    secondary to [name] infection   Mastalgia 05/1994   Osteoporosis, post-menopausal 03/14/2012   Ovarian tumor of borderline malignancy, right 2004   Pancreatitis    secondary to Cholelithiasis   Parkinson disease ([place]) 02/10/2015   Post-thoracotomy pain syndrome    Pseudomonas pneumonia ([place]) 03/2008   Status post implantation of mitral valve leaflet clip 02/15/2022   s/p XTW MitraClip x2 by Dr. Burt Knack in the  A2/P2 position   Traumatic closed fracture of distal clavicle with minimal displacement, left, initial encounter 06/2021   [facility]   Uterine fibroid    Vitamin D deficiency     PAST SURGICAL HISTORY:   Past Surgical History:  Procedure Laterality Date   ABDOMINAL HYSTERECTOMY     & BSO for Mucinous borderline tumor of R ovary 2004   APPENDECTOMY  2004   BREAST BIOPSY  08/2004   right breast-benign   BREAST LUMPECTOMY  1992   benign   BUBBLE STUDY  12/27/2021   Procedure: BUBBLE STUDY;  Surgeon: Geralynn Rile, MD;   Location: [facility] ENDOSCOPY;  Service: Cardiovascular;;   CARDIAC CATHETERIZATION  2007   essentially negation for significant CAD   CHOLECYSTECTOMY  2001   COLONOSCOPY  2014   Dr [name] , due 2019   COLONOSCOPY  09/2018   2 TA removed, int hem, no f/u needed (Dr [name])    ESOPHAGOGASTRODUODENOSCOPY  09/2018   GERD with esophagitis and stricture dilated, antral gastritis negative for H pylori [name])   LUNG BIOPSY  2002   [name], Dr [name]   MASTECTOMY MODIFIED RADICAL Left 2002   oral chemotheraphy (tamoxifen then Armidex) no radiation, Dr.[name]   MITRAL VALVE REPAIR N/A 02/15/2022   Procedure: MITRAL VALVE REPAIR;  Surgeon: Sherren Mocha, MD;  Location: [place] CV LAB;  Service: Cardiovascular;  Laterality: N/A;   RIGHT/LEFT HEART CATH AND CORONARY ANGIOGRAPHY Bilateral 11/14/2021   Procedure: RIGHT/LEFT HEART CATH AND CORONARY ANGIOGRAPHY;  Surgeon: Wellington Hampshire, MD;  Location: [place] [facility];  Service: Cardiovascular;  Laterality: Bilateral;   TEE WITHOUT CARDIOVERSION N/A 12/29/2018   Procedure: TRANSESOPHAGEAL ECHOCARDIOGRAM (TEE);  Surgeon: Wellington Hampshire, MD;  Location: [facility] ORS;  Service: Cardiovascular;  Laterality: N/A;   TEE WITHOUT CARDIOVERSION N/A 12/27/2021   Procedure: TRANSESOPHAGEAL ECHOCARDIOGRAM (TEE);  Surgeon: Geralynn Rile, MD;  Location: [place];  Service: Cardiovascular;  Laterality: N/A;   TEE WITHOUT CARDIOVERSION N/A 02/15/2022   Procedure: TRANSESOPHAGEAL ECHOCARDIOGRAM (TEE);  Surgeon: Sherren Mocha, MD;  Location: [place] [facility];  Service: Cardiovascular;  Laterality: N/A;   TEE WITHOUT CARDIOVERSION N/A 05/14/2022   Procedure: TRANSESOPHAGEAL ECHOCARDIOGRAM (TEE);  Surgeon: Geralynn Rile, MD;  Location: [place];  Service: Cardiovascular;  Laterality: N/A;   TONSILLECTOMY  1946    SOCIAL HISTORY:   Social History   Tobacco Use   Smoking status: Never   Smokeless tobacco: Never  Substance Use Topics    Alcohol use: No    Alcohol/week: 0.0 standard drinks of alcohol    FAMILY HISTORY:   Family History  Problem Relation Age of Onset   Emphysema Mother        d.64 was never a smoker   Lung cancer Father 89       d.82 history of smoking   Breast cancer Sister 55   Colon cancer Sister    Melanoma Maternal Aunt    Liver cancer Maternal Aunt    Cancer Maternal Uncle        unspecified type   Cancer Maternal Uncle        unspecified type   Cancer Maternal Uncle        unspecified type   Stroke Paternal Aunt        in mid 19s   Osteoporosis Paternal Aunt    Myasthenia gravis Paternal Aunt    Cancer Maternal Grandmother        d.82s unspecified GI cancer  Cancer Maternal Grandfather        d.62s unspecified type   Breast cancer Cousin        d.60s-daughter of unaffected paternal aunt Eustace Moore   Colon cancer Cousin 17       d.70-daughter of unaffected maternal aunt Leila   Lung cancer Cousin 33       d.70-sisters to each other, both daughters of maternal uncle Johnny   Parkinson's disease Cousin    Diabetes Neg Hx    Heart disease Neg Hx    Stomach cancer Neg Hx    Ulcerative colitis Neg Hx     DRUG ALLERGIES:   Allergies  Allergen Reactions   Sulfa Antibiotics Anaphylaxis   Sulfonamide Derivatives Anaphylaxis   Topamax [Topiramate] Other (See Comments)    Metabolic acidosis    Clarithromycin Other (See Comments)    pericarditis Other reaction(s): Not available   Hydrocodone Other (See Comments)    "hyper and climbing the walls"   Motrin [Ibuprofen] Other (See Comments)    headaches   Misc. Sulfonamide Containing Compounds     Other reaction(s): Not available   Symbicort [Budesonide-Formoterol Fumarate] Other (See Comments)    02/07/15 tremor   Percocet [Oxycodone-Acetaminophen] Itching and Rash   Tape Itching and Rash    Use paper tape only   Tetracyclines & Related Other (See Comments)    "immediate yeast infection"    REVIEW OF SYSTEMS:   ROS As per  history of present illness. All pertinent systems were reviewed above. Constitutional, HEENT, cardiovascular, respiratory, GI, GU, musculoskeletal, neuro, psychiatric, endocrine, integumentary and hematologic systems were reviewed and are otherwise negative/unremarkable except for positive findings mentioned above in the HPI.   MEDICATIONS AT HOME:   Prior to Admission medications   Medication Sig Start Date End Date Taking? Authorizing Provider  amLODipine (NORVASC) 2.5 MG tablet Take 1 tablet (2.5 mg total) by mouth daily. 08/09/22  Yes Wellington Hampshire, MD  aspirin 81 MG tablet Take 1 tablet (81 mg total) by mouth daily. 01/27/19  Yes [name], MD  Carbidopa-Levodopa ER (SINEMET CR) 25-100 MG tablet controlled release TAKE 2 TABLET BY MOUTH AT 9 AM, 1 TAB AT1 PM AND 1 TAB AT 5 PM 06/13/22  Yes Tat, Eustace Quail, DO  clonazePAM (KLONOPIN) 0.5 MG tablet TAKE 1/2 A TABLET BY MOUTH AT BEDTIME 06/13/22  Yes Tat, Eustace Quail, DO  fexofenadine (ALLEGRA) 180 MG tablet Take 180 mg by mouth daily.   Yes [provider]  Fluticasone-Umeclidin-Vilant (TRELEGY ELLIPTA) 100-62.5-25 MCG/ACT AEPB Inhale 1 puff into the lungs daily. 04/27/22  Yes Cobb, Karie Schwalbe, NP  furosemide (LASIX) 20 MG tablet Take 1 tablet (20 mg total) by mouth daily. 04/30/22  Yes Eileen Stanford, PA-C  gabapentin ([name]) 300 MG capsule TAKE 2 CAPSULES BY MOUTH 3 TIMES DAILY 06/04/22  Yes [name], MD  pantoprazole (PROTONIX) 40 MG tablet Take 1 tablet (40 mg total) by mouth daily. 05/15/22 02/09/23 Yes Eileen Stanford, PA-C  potassium chloride SA (KLOR-CON M) 20 MEQ tablet Take 1 tablet (20 mEq total) by mouth daily. 06/15/22  Yes Kathyrn Drown D, NP  acetaminophen (TYLENOL) 500 MG tablet Take 2 tablets (1,000 mg total) by mouth 2 (two) times daily as needed. 01/21/18   [name], MD  albuterol (VENTOLIN HFA) 108 (90 Base) MCG/ACT inhaler Inhale 2 puffs into the lungs every 6 (six) hours as needed for  wheezing or shortness of breath. 06/04/22   [name], MD  amoxicillin (AMOXIL)  500 MG capsule Take 4 capsules (2,000 mg total) by mouth as directed. Take 4 tablets 1 hour prior to dental work, including cleanings. Patient not taking: Reported on 08/13/2022 02/26/22   Kathyrn Drown D, NP  benzocaine-menthol (CHLORASEPTIC) 6-10 MG lozenge Take 1 lozenge by mouth as needed for sore throat. Patient not taking: Reported on 08/13/2022 02/16/22   Kathyrn Drown D, NP  EPINEPHrine 0.3 mg/0.3 mL IJ SOAJ injection Inject 0.3 mg into the muscle as needed for anaphylaxis. 02/25/20   [provider]      VITAL SIGNS:  Blood pressure (!) 149/92, pulse 82, temperature 97.7 F (36.5 C), temperature source Oral, resp. rate (!) 24, height '5\' 2"'$  (1.575 m), weight 60 kg, SpO2 96 %.  PHYSICAL EXAMINATION:  Physical Exam  GENERAL:  81 y.o.-year-old patient lying in the bed with mild conversational dyspnea.   EYES: Pupils equal, round, reactive to light and accommodation. No scleral icterus. Extraocular muscles intact.  HEENT: Head atraumatic, normocephalic. Oropharynx and nasopharynx clear.  NECK:  Supple, no jugular venous distention. No thyroid enlargement, no tenderness.  LUNGS: Diminished bibasilar breath sounds with bibasilar rales. No use of accessory muscles of respiration.  CARDIOVASCULAR: Regular rate and rhythm, S1, S2 normal. No murmurs, rubs, or gallops.  ABDOMEN: Soft, nondistended, nontender. Bowel sounds present. No organomegaly or mass.  EXTREMITIES: No pedal edema, cyanosis, or clubbing. Musculoskeletal: Left lateral hip tenderness   NEUROLOGIC: Cranial nerves II through XII are intact. Muscle strength 5/5 in all extremities. Sensation intact. Gait not checked.  PSYCHIATRIC: The patient is alert and oriented x 3.  Normal affect and good eye contact. SKIN: No obvious rash, lesion, or ulcer.   LABORATORY PANEL:   CBC Recent Labs  Lab 08/19/22 2106  WBC 6.5  HGB 12.0  HCT 37.9   PLT 151   ------------------------------------------------------------------------------------------------------------------  Chemistries  Recent Labs  Lab 08/19/22 2106  NA 141  K 3.6  CL 103  CO2 30  GLUCOSE 118*  BUN 22  CREATININE 1.21*  CALCIUM 8.8*   ------------------------------------------------------------------------------------------------------------------  Cardiac Enzymes No results for input(s): "TROPONINI" in the last 168 hours. ------------------------------------------------------------------------------------------------------------------  RADIOLOGY:  DG Hip Unilat W or Wo Pelvis 2-3 Views Left  Addendum Date: 08/19/2022   ADDENDUM REPORT: 08/19/2022 22:32 ADDENDUM: Please note findings and impression should state acute displaced left femoral neck fracture (NOT humeral). Electronically Signed   By: Iven Finn M.D.   On: 08/19/2022 22:32   Result Date: 08/19/2022 CLINICAL DATA:  fall EXAM: DG HIP (WITH OR WITHOUT PELVIS) 2-3V LEFT COMPARISON:  CT abdomen pelvis 02/19/2018 FINDINGS: Acute displaced left humeral neck fracture that is poorly visualized. No dislocation of the left hip. No acute displaced fracture or dislocation of the right hip on the frontal view. No acute displaced fracture or diastasis of the bones of the pelvis. There is no evidence of severe arthropathy or other focal bone abnormality. Degenerative changes visualized of the lower lumbar spine. IMPRESSION: Acute displaced left humeral neck fracture that is poorly visualized. Electronically Signed: By: Iven Finn M.D. On: 08/19/2022 22:09   DG Chest 1 View  Result Date: 08/19/2022 CLINICAL DATA:  [id number].  Fall EXAM: CHEST  1 VIEW COMPARISON:  None Available. FINDINGS: The heart and mediastinal contours is grossly unchanged with limited evaluation due to patient rotation and overlying right upper lobe disease and scarring. Prominent hilar vasculature. Redemonstration of right upper lobe  architectural torsion/scarring and wedge resection with known bronchiectasis not well visualized. Interval development of diffuse increased  interstitial and airspace opacities. Nonspecific blunting of the right costophrenic angle. No definite pleural effusion. No pneumothorax. No acute osseous abnormality. IMPRESSION: Interval development of diffuse increased interstitial and airspace opacities with underlying architectural scarring and wedge resection in the right upper lobe. Findings likely represent pulmonary edema with superimposed infection not excluded. Electronically Signed   By: Iven Finn M.D.   On: 08/19/2022 22:22      IMPRESSION AND PLAN:  Assessment and Plan: * Closed left hip fracture [facility]) - The patient will be admitted to a medical-surgical bed. - Pain management to be provided. - She will be kept n.p.o. after midnight. - Orthopedic consultation will be obtained. - Dr. Mack Guise was notified about the patient. - She has mild acute CHF with no history of coronary artery disease, diabetes mellitus on insulin, renal failure with creatinine more than 2 or CVA.  She is considered at above average risk for her age for perioperative cardiovascular events per the revised cardiac risk index.   Acute on chronic diastolic CHF (congestive heart failure) ([place]) - The patient will be diuresed with IV Lasix. - We will hold off long-acting beta agonist. - We will add BNP to her labs. - We will monitor I's and O's. - We will monitor daily weights. - Last 2D echo in June of this year revealed an EF of 65 to 70% with severely dilated right and left atria and it showed her MitraClip with recurrent severe eccentric mitral regurgitation related to a persistent flail A2 segment.  Essential hypertension - We will continue her amlodipine.  Chronic obstructive airway disease with asthma ([place]) - We will place her on as needed DuoNebs and hold off long-acting beta agonist in the setting of acute  CHF.  Anxiety - We will continue her Klonopin.   DVT prophylaxis: Lovenox.  Advanced Care Planning:  Code Status: The patient is DNR/DNI.  She has an out of facility DNR form..  Family Communication:  The plan of care was discussed in details with the patient (and family). I answered all questions. The patient agreed to proceed with the above mentioned plan. Further management will depend upon hospital course. Disposition Plan: Back to previous home environment Consults called: Orthopedic consult. All the records are reviewed and case discussed with ED provider.  Status is: Inpatient    At the time of the admission, it appears that the appropriate admission status for this patient is inpatient.  This is judged to be reasonable and necessary in order to provide the required intensity of service to ensure the patient's safety given the presenting symptoms, physical exam findings and initial radiographic and laboratory data in the context of comorbid conditions.  The patient requires inpatient status due to high intensity of service, high risk of further deterioration and high frequency of surveillance required.  I certify that at the time of admission, it is my clinical judgment that the patient will require inpatient hospital care extending more than 2 midnights.                            Dispo: The patient is from: Home              Anticipated d/c is to: Home              Patient currently is not medically stable to d/c.              Difficult to place patient: No  Christel Mormon M.D on 08/20/2022 at 1:45 AM  [facility]   From 7 PM-7 AM, contact night-coverage [email or web address]  CC: Primary care physician; [name], MD

## 2022-08-19 NOTE — ED Provider Notes (Signed)
[facility] Provider Note    Event Date/Time   First MD Initiated Contact with Patient 08/19/22 2129     (approximate)   History   Fall   HPI  Crystal Bass is a 81 y.o. female  who presents to the emergency department today because of concern for left hip pain after a fall. The patient thinks that she lost her balance when she was reaching down.  Landed on her left hip.  She denies hurting any other part of her body.  She denies hitting her head.  Denies any recent illness.  States she is on aspirin but no other blood thinners.     Physical Exam   Triage Vital Signs: ED Triage Vitals  Enc Vitals Group     BP 08/19/22 2106 (!) 139/90     Pulse Rate 08/19/22 2106 88     Resp 08/19/22 2106 (!) 26     Temp 08/19/22 2106 97.7 F (36.5 C)     Temp Source 08/19/22 2106 Oral     SpO2 08/19/22 2102 93 %     Weight 08/19/22 2108 132 lb 3.2 oz (60 kg)     Height 08/19/22 2108 '5\' 2"'$  (1.575 m)     Head Circumference --      Peak Flow --      Pain Score 08/19/22 2107 8     Pain Loc --      Pain Edu? --      Excl. in [place]? --     Most recent vital signs: Vitals:   08/19/22 2200 08/19/22 2215  BP: (!) 149/92   Pulse: 82 82  Resp: (!) 27 (!) 24  Temp:    SpO2: 98% 96%   General: Awake, alert, oriented. CV:  Good peripheral perfusion. Regular rate and rhythm. Resp:  Normal effort. Lungs clear. Abd:  No distention.  Other:  Left hip tender to palpation, left leg externally rotated.   ED Results / Procedures / Treatments   Labs (all labs ordered are listed, but only abnormal results are displayed) Labs Reviewed  CBC WITH DIFFERENTIAL/PLATELET  BASIC METABOLIC PANEL     EKG  I, Nance Pear, attending physician, personally viewed and interpreted this EKG  EKG Time: 2107 Rate: 88 Rhythm: sinus rhythm Axis: right axis deviation Intervals: qtc 464 QRS: LVH ST changes: no st elevation Impression: abnormal ekg  RADIOLOGY I  independently interpreted and visualized the left hip. My interpretation: subcapital fracture Radiology interpretation:  IMPRESSION:  Acute displaced left humeral neck fracture that is poorly  visualized.  ADDENDUM: Please note findings and impression should state acute displaced left femoral neck fracture (NOT humeral).    PROCEDURES:  Critical Care performed: No  Procedures   MEDICATIONS ORDERED IN ED: Medications - No data to display   IMPRESSION / MDM / [place] / ED COURSE  I reviewed the triage vital signs and the nursing notes.                              Differential diagnosis includes, but is not limited to, hip fracture, dislocation  Patient's presentation is most consistent with acute presentation with potential threat to life or bodily function.  Patient presented to the emergency department today because of concern for left hip pain after a fall. X-ray is consistent with left hip fracture. Patient denies any other injury. Discussed with Dr. Mack Guise with orthopedics. Discussed with  Dr. Sidney Ace with the hospitalist service who will plan on admission.   FINAL CLINICAL IMPRESSION(S) / ED DIAGNOSES   Final diagnoses:  Fall, initial encounter  Closed fracture of left hip, initial encounter [facility])     Note:  This document was prepared using Dragon voice recognition software and may include unintentional dictation errors.    Nance Pear, MD 08/19/22 2253

## 2022-08-20 ENCOUNTER — Inpatient Hospital Stay (HOSPITAL_COMMUNITY)
Admit: 2022-08-20 | Discharge: 2022-08-20 | Disposition: A | Discharge status: Home / Self Care | Payer: Medicare PPO | Attending: Cardiology | Admitting: Cardiology

## 2022-08-20 ENCOUNTER — Encounter: Payer: Self-pay | Admitting: Family Medicine

## 2022-08-20 ENCOUNTER — Other Ambulatory Visit: Payer: Medicare PPO

## 2022-08-20 DIAGNOSIS — I340 Nonrheumatic mitral (valve) insufficiency: Secondary | ICD-10-CM | POA: Diagnosis not present

## 2022-08-20 DIAGNOSIS — R0609 Other forms of dyspnea: Secondary | ICD-10-CM | POA: Diagnosis not present

## 2022-08-20 DIAGNOSIS — Z01810 Encounter for preprocedural cardiovascular examination: Secondary | ICD-10-CM

## 2022-08-20 DIAGNOSIS — F419 Anxiety disorder, unspecified: Secondary | ICD-10-CM | POA: Diagnosis present

## 2022-08-20 DIAGNOSIS — E440 Moderate protein-calorie malnutrition: Secondary | ICD-10-CM | POA: Diagnosis present

## 2022-08-20 DIAGNOSIS — I5033 Acute on chronic diastolic (congestive) heart failure: Secondary | ICD-10-CM | POA: Diagnosis present

## 2022-08-20 DIAGNOSIS — I5032 Chronic diastolic (congestive) heart failure: Secondary | ICD-10-CM | POA: Diagnosis present

## 2022-08-20 DIAGNOSIS — S72002A Fracture of unspecified part of neck of left femur, initial encounter for closed fracture: Secondary | ICD-10-CM | POA: Diagnosis not present

## 2022-08-20 LAB — BASIC METABOLIC PANEL
Anion gap: 8 (ref 5–15)
BUN: 21 mg/dL (ref 8–23)
CO2: 30 mmol/L (ref 22–32)
Calcium: 8.5 mg/dL — ABNORMAL LOW (ref 8.9–10.3)
Chloride: 103 mmol/L (ref 98–111)
Creatinine, Ser: 0.8 mg/dL (ref 0.44–1.00)
GFR, Estimated: >60 mL/min (ref >60)
Glucose, Bld: 106 mg/dL — ABNORMAL HIGH (ref 70–99)
Potassium: 3.4 mmol/L — ABNORMAL LOW (ref 3.5–5.1)
Sodium: 141 mmol/L (ref 135–145)

## 2022-08-20 LAB — TYPE AND SCREEN
ABO/RH(D): O POS
Antibody Screen: NEGATIVE

## 2022-08-20 LAB — CBC
HCT: 36 % (ref 36.0–46.0)
Hemoglobin: 11.7 g/dL — ABNORMAL LOW (ref 12.0–15.0)
MCH: 30.1 pg (ref 26.0–34.0)
MCHC: 32.5 g/dL (ref 30.0–36.0)
MCV: 92.5 fL (ref 80.0–100.0)
Platelets: 146 10*3/uL — ABNORMAL LOW (ref 150–400)
RBC: 3.89 MIL/uL (ref 3.87–5.11)
RDW: 14.9 % (ref 11.5–15.5)
WBC: 7.6 10*3/uL (ref 4.0–10.5)
nRBC: 0 % (ref 0.0–0.2)

## 2022-08-20 LAB — ECHOCARDIOGRAM COMPLETE
AR max vel: 2.59 cm2
AV Area VTI: 2.42 cm2
AV Area mean vel: 2.41 cm2
AV Mean grad: 4 mmHg
AV Peak grad: 8.8 mmHg
Ao pk vel: 1.48 m/s
Area-P 1/2: 3.89 cm2
Height: 62 in
S' Lateral: 3.2 cm
Weight: 2115.2 oz

## 2022-08-20 LAB — BRAIN NATRIURETIC PEPTIDE: B Natriuretic Peptide: 465.7 pg/mL — ABNORMAL HIGH (ref 0.0–100.0)

## 2022-08-20 MED ORDER — FUROSEMIDE 10 MG/ML IJ SOLN
20.0000 mg | Freq: Once | INTRAMUSCULAR | Status: AC
  Administered 2022-08-20: 20 mg via INTRAVENOUS
  Filled 2022-08-20: qty 4

## 2022-08-20 MED ORDER — POTASSIUM CHLORIDE CRYS ER 20 MEQ PO TBCR
40.0000 meq | EXTENDED_RELEASE_TABLET | ORAL | Status: AC
  Administered 2022-08-20 (×2): 40 meq via ORAL
  Filled 2022-08-20 (×2): qty 2

## 2022-08-20 MED ORDER — PANTOPRAZOLE SODIUM 40 MG IV SOLR
40.0000 mg | Freq: Once | INTRAVENOUS | Status: AC
  Administered 2022-08-20: 40 mg via INTRAVENOUS
  Filled 2022-08-20: qty 10

## 2022-08-20 MED ORDER — BOOST / RESOURCE BREEZE PO LIQD CUSTOM
1.0000 | Freq: Three times a day (TID) | ORAL | Status: DC
  Administered 2022-08-20 – 2022-08-23 (×7): 1 via ORAL

## 2022-08-20 MED ORDER — MORPHINE SULFATE (PF) 2 MG/ML IV SOLN
2.0000 mg | INTRAVENOUS | Status: DC | PRN
  Administered 2022-08-20 (×2): 2 mg via INTRAVENOUS
  Filled 2022-08-20 (×2): qty 1

## 2022-08-20 MED ORDER — MORPHINE SULFATE 15 MG PO TABS: 15.0000 mg | ORAL_TABLET | ORAL | Status: DC | PRN

## 2022-08-20 MED ORDER — PROSOURCE PLUS PO LIQD
30.0000 mL | Freq: Two times a day (BID) | ORAL | Status: DC
  Administered 2022-08-20 – 2022-08-23 (×5): 30 mL via ORAL
  Filled 2022-08-20 (×9): qty 30

## 2022-08-20 MED ORDER — ACETAMINOPHEN 325 MG PO TABS
650.0000 mg | ORAL_TABLET | ORAL | Status: DC | PRN
  Filled 2022-08-20: qty 2

## 2022-08-20 MED ORDER — POTASSIUM CHLORIDE 10 MEQ/100ML IV SOLN
10.0000 meq | INTRAVENOUS | Status: DC
  Administered 2022-08-20: 10 meq via INTRAVENOUS
  Filled 2022-08-20: qty 100

## 2022-08-20 MED ORDER — ADULT MULTIVITAMIN W/MINERALS CH
1.0000 | ORAL_TABLET | Freq: Every day | ORAL | Status: DC
  Administered 2022-08-20: 1 via ORAL
  Filled 2022-08-20: qty 1

## 2022-08-20 MED ORDER — MORPHINE SULFATE 15 MG PO TABS: 30.0000 mg | ORAL_TABLET | ORAL | Status: DC | PRN

## 2022-08-20 MED ORDER — HYDROMORPHONE HCL 1 MG/ML IJ SOLN
0.5000 mg | INTRAMUSCULAR | Status: DC | PRN
  Administered 2022-08-21: 0.5 mg via INTRAVENOUS
  Filled 2022-08-20: qty 1

## 2022-08-20 NOTE — Assessment & Plan Note (Addendum)
-   The patient will be diuresed with IV Lasix. - We will hold off long-acting beta agonist. - We will add BNP to her labs. - We will monitor I's and O's. - We will monitor daily weights. - Last 2D echo in June of this year revealed an EF of 65 to 70% with severely dilated right and left atria and it showed her MitraClip with recurrent severe eccentric mitral regurgitation related to a persistent flail A2 segment.

## 2022-08-20 NOTE — Assessment & Plan Note (Signed)
-   We will continue her Klonopin.

## 2022-08-20 NOTE — Progress Notes (Signed)
  Progress Note Patient: Crystal Bass IFO:[id number] DOB: 04/28/1941 DOA: 08/19/2022  DOS: the patient was seen and examined on 08/20/2022  Brief hospital course: PMH of Parkinson's disease, severe mitral regurgitation SP MitraClip with chronic HF PEF, HTN, COPD, asthma present to the hospital with complaints of a fall.  Etiology of the fall is not clear.  She was walking and lost her balance and fell on the left side. Appears to have acute displaced left femoral neck fracture.  Orthopedic consulted.  Scheduled for bipolar hemiarthroplasty 9/26. Also has acute on chronic HFpEF.  Cardiology consult for preop clearance. Assessment and Plan: Closed left hip fracture ([place]) Presents after a fall. Etiology of the fall is not clear. Found to have left hip fracture. Orthopedic consult. Scheduled for surgery tomorrow. Currently no prohibitive risk but will consult cardiology for preop clearance.  Acute on chronic diastolic CHF (congestive heart failure) (HCC) HTN. History of mitral valve repair with MitraClip CAD Mild hypoxia at the time of admission. Bilateral basal crackles. Chest x-ray shows vascular congestion. Treated with IV Lasix. We will continue 1 more dose. Cardiology consult for preop clearance.  Chronic obstructive airway disease with asthma ([place]) No evidence of Exacerbation for now. Monitor.  Parkinson's disease. Continue Sinemet.  Could be responsible for patient's fall.  Hypokalemia. We will replace. Monitor.  Acute kidney injury. Baseline serum creatinine 0.79. On admission serum creatinine 1.21. Likely cardiorenal hemodynamics as it is improving with IV diuresis. Monitor.  Mild thrombocytopenia. For now we will monitor. No evidence of bleeding.  Anxiety Continue Klonopin. Continue gabapentin.  Subjective: Pain not well controlled.  No nausea no vomiting.  No chest pain.  Continues to have some shortness of breath.  No cough.  No headache.  No  dizziness.  Physical Exam: Vitals:   08/19/22 2200 08/19/22 2215 08/20/22 0350 08/20/22 0740  BP: (!) 149/92  134/87 128/86  Pulse: 82 82 70 71  Resp: (!) 27 (!) '24 17 18  '$ Temp:   98.4 F (36.9 C) 98.3 F (36.8 C)  TempSrc:      SpO2: 98% 96% 99% 99%  Weight:      Height:       General: Appear in mild distress; no visible Abnormal Neck Mass Or lumps, Conjunctiva normal Cardiovascular: S1 and S2 Present, aortic systolic  Murmur, Respiratory: good respiratory effort, Bilateral Air entry present and  bilateral  Crackles, no wheezes Abdomen: Bowel Sound present, Non tender  Extremities: bilateral  Pedal edema Neurology: alert and oriented to time, place, and person  Gait not checked due to patient safety concerns   Data Reviewed: I have Reviewed nursing notes, Vitals, and Lab results since pt's last encounter. Pertinent lab results CBC, BMP I have ordered test including CBC, BMP I have discussed pt's care plan and test results with cardiology, orthopedics.   Family Communication: Niece at bedside  Disposition: Status is: Inpatient Remains inpatient appropriate because: Need for surgery and preop clearance.  Author: Berle Mull, MD 08/20/2022 10:22 AM  Please look on [email or web address] to find out who is on call.

## 2022-08-20 NOTE — Discharge Instructions (Signed)

## 2022-08-20 NOTE — Progress Notes (Signed)
Initial Nutrition Assessment  DOCUMENTATION CODES:   Non-severe (moderate) malnutrition in context of chronic illness  INTERVENTION:   -Boost Breeze po TID, each supplement provides 250 kcal and 9 grams of protein  -30 ml Prosource Plus BID, each supplement provides 100 kcals and 15 grams protein -MVI with minerals daily -Downgrade diet to dysphagia 3 diet for ease of intake  NUTRITION DIAGNOSIS:   Moderate Malnutrition related to chronic illness (Parkinsons, CHF, COPD) as evidenced by mild fat depletion, moderate fat depletion, mild muscle depletion, moderate muscle depletion.  GOAL:   Patient will meet greater than or equal to 90% of their needs  MONITOR:   PO intake, Supplement acceptance  REASON FOR ASSESSMENT:   Consult Assessment of nutrition requirement/status, Hip fracture protocol  ASSESSMENT:   Pt with PMH of Parkinson's disease, severe mitral regurgitation SP MitraClip with chronic HF PEF, HTN, COPD, asthma presents with complaints of a fall.  Pt admitted with closed lt hip fracture.   Reviewed I/O's: -800 ml x 24 hours  UOP: 800 ml x 24 hours  Per orthopedics notes, plan for hemiarthroplasty.   Pt sitting up at bed at time of visit, complains of not feeling well secondary to pain. Observed breakfast tray; pt consumed 25% of pancakes and eggs and 100% of water. Pt shares she has had a poor appetite over the past 9 months. Pt consumes 3 meals per day (Breakfast: cereal; Lunch: soup; Dinner: out with friends). Pt reports difficulty chewing, especially meats and reports that she has to have her friends cut up her meat.   Pt shares that she has lost weight, but is unsure how much and unsure of UBW. Reviewed wt hx; no wt loss noted over the past 2 months. Suspect edema may be masking true weight loss as well as fat and muscle depletions.   Discussed importance of good meal and supplement intake to promote healing. Pt does not like milky supplements, but willing to  try clear supplements.   Medications reviewed and include sinemet.   Labs reviewed: K: 3.4.     NUTRITION - FOCUSED PHYSICAL EXAM:  Flowsheet Row Most Recent Value  Orbital Region Moderate depletion  Upper Arm Region Mild depletion  Thoracic and Lumbar Region Mild depletion  Buccal Region Mild depletion  Temple Region Mild depletion  Clavicle Bone Region Moderate depletion  Clavicle and Acromion Bone Region Moderate depletion  Scapular Bone Region Moderate depletion  Dorsal Hand Mild depletion  Patellar Region No depletion  Anterior Thigh Region No depletion  Posterior Calf Region No depletion  Edema (RD Assessment) Mild  Hair Reviewed  Eyes Reviewed  Mouth Reviewed  Skin Reviewed  Nails Reviewed       Diet Order:   Diet Order             Diet Heart Room service appropriate? Yes; Fluid consistency: Thin  Diet effective now                   EDUCATION NEEDS:   No education needs have been identified at this time  Skin:  Skin Assessment: Reviewed RN Assessment  Last BM:  08/16/22  Height:   Ht Readings from Last 1 Encounters:  08/19/22 '5\' 2"'$  (1.575 m)    Weight:   Wt Readings from Last 1 Encounters:  08/19/22 60 kg    Ideal Body Weight:  50 kg  BMI:  Body mass index is 24.18 kg/m.  Estimated Nutritional Needs:   Kcal:  1500-1700  Protein:  75-90 grams  Fluid:  > 1.5 L    Loistine Chance, RD, LDN, [place] Registered Dietitian II Certified Diabetes Care and Education Specialist Please refer to [facility] for RD and/or RD on-call/weekend/after hours pager

## 2022-08-20 NOTE — Assessment & Plan Note (Addendum)
-   The patient will be admitted to a medical-surgical bed. - Pain management to be provided. - She will be kept n.p.o. after midnight. - Orthopedic consultation will be obtained. - Dr. Mack Guise was notified about the patient. - She has mild acute CHF with no history of coronary artery disease, diabetes mellitus on insulin, renal failure with creatinine more than 2 or CVA.  She is considered at above average risk for her age for perioperative cardiovascular events per the revised cardiac risk index.

## 2022-08-20 NOTE — Plan of Care (Signed)
  Problem: Health Behavior/Discharge Planning: Goal: Ability to manage health-related needs will improve Outcome: Progressing   Problem: Clinical Measurements: Goal: Will remain free from infection Outcome: Progressing   Problem: Activity: Goal: Risk for activity intolerance will decrease Outcome: Progressing   Problem: Coping: Goal: Level of anxiety will decrease Outcome: Progressing   Problem: Pain Managment: Goal: General experience of comfort will improve Outcome: Progressing   Problem: Safety: Goal: Ability to remain free from injury will improve Outcome: Progressing

## 2022-08-20 NOTE — Consult Note (Signed)
Cardiology Consultation   Patient ID: [name] MRN: [id number]; DOB: 28-Feb-1941  Admit date: 08/19/2022 Date of Consult: 08/20/2022  PCP:  Crystal Bass, [place] [facility] Cardiologist:  [name], MD        Patient Profile:   Crystal Bass is a 81 y.o. female with a hx of severe mitral regurgitation status post MitraClip, history of pulmonary embolism, Parkinson's disease, orthostatic hypotension, [name] infection with chronic lung disease, asthma, COPD, who is being seen 08/20/2022 for the evaluation of a left hip fracture requiring surgical clearance at the request of Dr. Posey Pronto.  History of Present Illness:   Crystal Bass is a 81 year old female with complex medical history of severe mitral regurgitation status post MitraClip, significant asthma, COPD, history of a pulmonary embolism was on apixaban for 3 months, hypertension, Parkinson's disease, and osteoarthritis.  She was hospitalized in December 2019 with atypical chest pain.  Initial CT of the chest showed single small pulmonary embolism in the apical segmental branch of the left upper lobe.  There was also evidence of bronchiectasis.  Repeat CT after 2 months showed resolution of pulmonary embolism and she was treated on apixaban for 3 months.  She had worsening exertional dyspnea in 2022 and underwent echocardiogram in October which revealed normal LV function, G2 DD, small pericardial effusion and moderate to severe mitral regurgitation due to significant prolapse of the anterior mitral leaflet.  She has a history of COPD but never smoked.  Pulmonary function test that showed minimal obstructive disease.  She had a right and left cardiac catheterization that was done in March which showed normal coronary arteries and normal LV systolic function.  Mitral regurgitation was moderate to severe by ventricular angiography and severe by hemodynamic evaluation.  Right heart catheterization showed  mildly elevated filling pressures, moderate pulmonary hypertension, normal cardiac output.  She underwent mitral valve clip procedure in March however had minimal improvement of symptoms since then and repeat echocardiogram showed evidence of severe mitral regurgitation confirmed by TEE.  She followed up with Dr. Burt Knack with plans to proceed with placement of a third mitral valve clip and the procedure was canceled the day before.  She continues to have class III [place] heart associated symptoms.  She presents to the [facility] emergency department on 08/19/2022 with left hip pain after mechanical fall.  Patient states that she just lost her balance when she was reaching down landed on her left hip.  Denies pain anywhere else in her body from the fall.  She denies hitting her head as well.  In the emergency department she was found to have a left hip fracture.  Does complain of pain to the left hip but denies chest pain, palpitations, peripheral edema, lightheadedness, dizziness, or syncope does some occasional shortness of breath with an occasional cough.  Initial vitals in the emergency department: Blood pressure 139/90, pulse 88, respirations of 26, temperature 97.7  Pertinent labs: Glucose of 118, serum creatinine of 1.21, calcium 8.8, GFR 46, BNP 465.7 slightly up from her previous 317.9  Imaging: Diagnostic for the hip reveals left femoral neck fracture, chest x-ray reveals interval development of diffuse increased interstitial and airspace opacities with underlying architectural scarring and wedge resection in the right upper lobe.  Findings likely represent pulmonary edema with superimposed infection not excluded  Past Medical History:  Diagnosis Date   Aneurysm of aorta (HCC)    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   Anginal pain (  [place])    Aortic root aneurysm ([place])    Arthritis    Asthma    Breast cancer ([place]) 2002   infiltrative ductal carcinoma    Cataract 2019   COPD (chronic obstructive  pulmonary disease) ([place]) 07/2007   Ductal carcinoma of breast, estrogen receptor positive, stage 1 ([place]) 09/16/2012   Dyspnea    Endometriosis    GERD (gastroesophageal reflux disease)    Hypercalcemia 09/14/2013   Hypertension    Lesion of breast 1992   right, benign   Lung disease    secondary to [name] infection   Mastalgia 05/1994   Osteoporosis, post-menopausal 03/14/2012   Ovarian tumor of borderline malignancy, right 2004   Pancreatitis    secondary to Cholelithiasis   Parkinson disease ([place]) 02/10/2015   Post-thoracotomy pain syndrome    Pseudomonas pneumonia ([place]) 03/2008   Status post implantation of mitral valve leaflet clip 02/15/2022   s/p XTW MitraClip x2 by Dr. Burt Knack in the A2/P2 position   Traumatic closed fracture of distal clavicle with minimal displacement, left, initial encounter 06/2021   [facility]   Uterine fibroid    Vitamin D deficiency     Past Surgical History:  Procedure Laterality Date   ABDOMINAL HYSTERECTOMY     & BSO for Mucinous borderline tumor of R ovary 2004   APPENDECTOMY  2004   BREAST BIOPSY  08/2004   right breast-benign   BREAST LUMPECTOMY  1992   benign   BUBBLE STUDY  12/27/2021   Procedure: BUBBLE STUDY;  Surgeon: Geralynn Rile, MD;  Location: [facility] ENDOSCOPY;  Service: Cardiovascular;;   CARDIAC CATHETERIZATION  2007   essentially negation for significant CAD   CHOLECYSTECTOMY  2001   COLONOSCOPY  2014   Dr [name] , due 2019   COLONOSCOPY  09/2018   2 TA removed, int hem, no f/u needed (Dr [name])    ESOPHAGOGASTRODUODENOSCOPY  09/2018   GERD with esophagitis and stricture dilated, antral gastritis negative for H pylori [name])   LUNG BIOPSY  2002   [name], Dr [name]   MASTECTOMY MODIFIED RADICAL Left 2002   oral chemotheraphy (tamoxifen then Armidex) no radiation, Dr.[name]   MITRAL VALVE REPAIR N/A 02/15/2022   Procedure: MITRAL VALVE REPAIR;  Surgeon: Sherren Mocha, MD;  Location: [place] CV LAB;  Service:  Cardiovascular;  Laterality: N/A;   RIGHT/LEFT HEART CATH AND CORONARY ANGIOGRAPHY Bilateral 11/14/2021   Procedure: RIGHT/LEFT HEART CATH AND CORONARY ANGIOGRAPHY;  Surgeon: Wellington Hampshire, MD;  Location: [place] CV LAB;  Service: Cardiovascular;  Laterality: Bilateral;   TEE WITHOUT CARDIOVERSION N/A 12/29/2018   Procedure: TRANSESOPHAGEAL ECHOCARDIOGRAM (TEE);  Surgeon: Wellington Hampshire, MD;  Location: [facility] ORS;  Service: Cardiovascular;  Laterality: N/A;   TEE WITHOUT CARDIOVERSION N/A 12/27/2021   Procedure: TRANSESOPHAGEAL ECHOCARDIOGRAM (TEE);  Surgeon: Geralynn Rile, MD;  Location: [place];  Service: Cardiovascular;  Laterality: N/A;   TEE WITHOUT CARDIOVERSION N/A 02/15/2022   Procedure: TRANSESOPHAGEAL ECHOCARDIOGRAM (TEE);  Surgeon: Sherren Mocha, MD;  Location: [place] [facility] LAB;  Service: Cardiovascular;  Laterality: N/A;   TEE WITHOUT CARDIOVERSION N/A 05/14/2022   Procedure: TRANSESOPHAGEAL ECHOCARDIOGRAM (TEE);  Surgeon: Geralynn Rile, MD;  Location: [place];  Service: Cardiovascular;  Laterality: N/A;   TONSILLECTOMY  1946     Home Medications:  Prior to Admission medications   Medication Sig Start Date End Date Taking? Authorizing Provider  amLODipine (NORVASC) 2.5 MG tablet Take 1 tablet (2.5 mg total) by mouth daily. 08/09/22  Yes  Wellington Hampshire, MD  aspirin 81 MG tablet Take 1 tablet (81 mg total) by mouth daily. 01/27/19  Yes Crystal Bush, MD  Carbidopa-Levodopa ER (SINEMET CR) 25-100 MG tablet controlled release TAKE 2 TABLET BY MOUTH AT 9 AM, 1 TAB AT1 PM AND 1 TAB AT 5 PM 06/13/22  Yes Tat, Eustace Quail, DO  clonazePAM (KLONOPIN) 0.5 MG tablet TAKE 1/2 A TABLET BY MOUTH AT BEDTIME 06/13/22  Yes Tat, Eustace Quail, DO  fexofenadine (ALLEGRA) 180 MG tablet Take 180 mg by mouth daily.   Yes [provider]  Fluticasone-Umeclidin-Vilant (TRELEGY ELLIPTA) 100-62.5-25 MCG/ACT AEPB Inhale 1 puff into the lungs daily. 04/27/22  Yes Cobb,  Karie Schwalbe, NP  furosemide (LASIX) 20 MG tablet Take 1 tablet (20 mg total) by mouth daily. 04/30/22  Yes Eileen Stanford, PA-C  gabapentin ([name]) 300 MG capsule TAKE 2 CAPSULES BY MOUTH 3 TIMES DAILY 06/04/22  Yes Crystal Bush, MD  pantoprazole (PROTONIX) 40 MG tablet Take 1 tablet (40 mg total) by mouth daily. 05/15/22 02/09/23 Yes Eileen Stanford, PA-C  potassium chloride SA (KLOR-CON M) 20 MEQ tablet Take 1 tablet (20 mEq total) by mouth daily. 06/15/22  Yes Kathyrn Drown D, NP  acetaminophen (TYLENOL) 500 MG tablet Take 2 tablets (1,000 mg total) by mouth 2 (two) times daily as needed. 01/21/18   Crystal Bush, MD  albuterol (VENTOLIN HFA) 108 (90 Base) MCG/ACT inhaler Inhale 2 puffs into the lungs every 6 (six) hours as needed for wheezing or shortness of breath. 06/04/22   Crystal Bush, MD  amoxicillin (AMOXIL) 500 MG capsule Take 4 capsules (2,000 mg total) by mouth as directed. Take 4 tablets 1 hour prior to dental work, including cleanings. Patient not taking: Reported on 08/13/2022 02/26/22   Kathyrn Drown D, NP  benzocaine-menthol (CHLORASEPTIC) 6-10 MG lozenge Take 1 lozenge by mouth as needed for sore throat. Patient not taking: Reported on 08/13/2022 02/16/22   Kathyrn Drown D, NP  EPINEPHrine 0.3 mg/0.3 mL IJ SOAJ injection Inject 0.3 mg into the muscle as needed for anaphylaxis. 02/25/20   [provider]    Inpatient Medications: Scheduled Meds:  (feeding supplement) PROSource Plus  30 mL Oral BID BM   Carbidopa-Levodopa ER  2 tablet Oral Daily   And   Carbidopa-Levodopa ER  1 tablet Oral BID   clonazePAM  0.5 mg Oral QHS   feeding supplement  1 Container Oral TID BM   fluticasone furoate-vilanterol  1 puff Inhalation Daily   And   umeclidinium bromide  1 puff Inhalation Daily   gabapentin  600 mg Oral TID   multivitamin with minerals  1 tablet Oral Daily   Continuous Infusions:  PRN Meds: acetaminophen, albuterol, morphine, morphine  injection, ondansetron (ZOFRAN) IV, traZODone  Allergies:    Allergies  Allergen Reactions   Sulfa Antibiotics Anaphylaxis   Sulfonamide Derivatives Anaphylaxis   Topamax [Topiramate] Other (See Comments)    Metabolic acidosis    Clarithromycin Other (See Comments)    pericarditis Other reaction(s): Not available   Hydrocodone Other (See Comments)    "hyper and climbing the walls"   Motrin [Ibuprofen] Other (See Comments)    headaches   Misc. 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, 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, 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  Tobacco Use   Smoking status: Never   Smokeless tobacco: Never  Vaping Use   Vaping Use: Never used  Substance and Sexual Activity   Alcohol use: No    Alcohol/week: 0.0 standard drinks of alcohol   Drug use: No   Sexual activity: Not Currently    Birth control/protection: Surgical    Comment: TAH/BSO  Other Topics Concern   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    Aunt of Dr Margaretmary Eddy    Lives independent living at [facility]    No children    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, 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, 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, 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)    Activity: walking about 1 mile/day, enjoys [employer]    Diet: some water, fruits/vegetables some    Right handed    Social Determinants of Health   Financial Resource Strain: Low Risk  (08/13/2022)   Overall Financial Resource Strain (CARDIA)    Difficulty of  Paying Living Expenses: Not hard at all  Food Insecurity: No Food Insecurity (08/13/2022)   Hunger Vital Sign    Worried About Running Out of Food in the Last Year: Never true    [place] in the Last Year: Never true  Transportation Needs: No Transportation Needs (08/13/2022)   PRAPARE - Hydrologist (Medical): No    Lack of Transportation (Non-Medical): No  Physical Activity: Insufficiently Active (08/13/2022)   Exercise Vital Sign    Days of Exercise per Week: 4 days    Minutes of Exercise per Session: 30 min  Stress: No Stress Concern Present (08/13/2022)   [place]    Feeling of Stress : Not at all  Social Connections: Socially Isolated (08/13/2022)   Social Connection and Isolation Panel [NHANES]    Frequency of Communication with Friends and Family: More than three times a week    Frequency of Social Gatherings with Friends and Family: Once a week    Attends Religious Services: Never    Marine scientist or Organizations: No    Attends Archivist Meetings: Never    Marital Status: Widowed  Intimate Partner Violence: Not At Risk (08/13/2022)   Humiliation, Afraid, Rape, and Kick questionnaire    Fear of Current or Ex-Partner: No    Emotionally Abused: No    Physically Abused: No    Sexually Abused: No    Family History:    Family History  Problem Relation Age of Onset   Emphysema Mother        d.64 was never a smoker   Lung cancer Father 84       d.82 history of smoking   Breast cancer Sister 57   Colon cancer Sister    Melanoma Maternal Aunt    Liver cancer Maternal Aunt    Cancer Maternal Uncle        unspecified type   Cancer Maternal Uncle        unspecified type   Cancer Maternal Uncle        unspecified type  Stroke Paternal Aunt        in mid 28s   Osteoporosis Paternal Aunt    Myasthenia gravis Paternal Aunt    Cancer Maternal Grandmother         d.82s unspecified GI cancer   Cancer Maternal Grandfather        d.62s unspecified type   Breast cancer Cousin        d.60s-daughter of unaffected paternal aunt Eustace Moore   Colon cancer Cousin 64       d.70-daughter of unaffected maternal aunt Leila   Lung cancer Cousin 70       d.70-sisters to each other, both daughters of maternal uncle Johnny   Parkinson's disease Cousin    Diabetes Neg Hx    Heart disease Neg Hx    Stomach cancer Neg Hx    Ulcerative colitis Neg Hx      ROS:  Please see the history of present illness.  Review of Systems  Constitutional:  Positive for malaise/fatigue.  Respiratory:  Positive for shortness of breath.   Musculoskeletal:  Positive for falls and joint pain.  Neurological:  Positive for weakness.    All other ROS reviewed and negative.     Physical Exam/Data:   Vitals:   08/19/22 2200 08/19/22 2215 08/20/22 0350 08/20/22 0740  BP: (!) 149/92  134/87 128/86  Pulse: 82 82 70 71  Resp: (!) 27 (!) '24 17 18  '$ Temp:   98.4 F (36.9 C) 98.3 F (36.8 C)  TempSrc:      SpO2: 98% 96% 99% 99%  Weight:      Height:        Intake/Output Summary (Last 24 hours) at 08/20/2022 1527 Last data filed at 08/20/2022 1300 Gross per 24 hour  Intake 240 ml  Output 1300 ml  Net -1060 ml      08/19/2022    9:08 PM 08/13/2022    1:24 PM 08/09/2022    2:32 PM  Last 3 Weights  Weight (lbs) 132 lb 3.2 oz 120 lb 120 lb  Weight (kg) 59.966 kg 54.432 kg 54.432 kg     Body mass index is 24.18 kg/m.  General: Frail-appearing in no acute distress HEENT: normal Neck: no JVD Vascular: No carotid bruits; Distal pulses 2+ bilaterally Cardiac:  normal S1, S2; RRR; II/VI holosystolic murmur best heard at the apex and left sternal border Lungs: Diminished to auscultation bilaterally, no wheezing, rhonchi or rales, respirations are unlabored at rest on O2 2 L nasal cannula Abd: soft, nontender, no hepatomegaly  Ext: no edema Musculoskeletal:  No deformities in BUE  strength normal and equal, left lower extremity shorter due to fracture Skin: warm and dry  Neuro:  CNs 2-12 intact, no focal abnormalities noted Psych:  Normal affect   EKG:  The EKG was personally reviewed and demonstrates: Sinus rhythm rate 83 with LVH noted Telemetry:  Telemetry was personally reviewed and demonstrates: Not currently on telemetry monitor  Relevant CV Studies: Echocardiogram ordered and pending  Laboratory Data:  High Sensitivity Troponin:  No results for input(s): "TROPONINIHS" in the last 720 hours.   Chemistry Recent Labs  Lab 08/19/22 2106 08/20/22 0555  NA 141 141  K 3.6 3.4*  CL 103 103  CO2 30 30  GLUCOSE 118* 106*  BUN 22 21  CREATININE 1.21* 0.80  CALCIUM 8.8* 8.5*  GFRNONAA 46* >60  ANIONGAP 8 8    No results for input(s): "PROT", "ALBUMIN", "AST", "ALT", "ALKPHOS", "BILITOT" in the last 168  hours. Lipids No results for input(s): "CHOL", "TRIG", "HDL", "LABVLDL", "LDLCALC", "CHOLHDL" in the last 168 hours.  Hematology Recent Labs  Lab 08/19/22 2106 08/20/22 0555  WBC 6.5 7.6  RBC 4.02 3.89  HGB 12.0 11.7*  HCT 37.9 36.0  MCV 94.3 92.5  MCH 29.9 30.1  MCHC 31.7 32.5  RDW 15.0 14.9  PLT 151 146*   Thyroid No results for input(s): "TSH", "FREET4" in the last 168 hours.  BNP Recent Labs  Lab 08/18/22 2106  BNP 465.7*    DDimer No results for input(s): "DDIMER" in the last 168 hours.   Radiology/Studies:  DG Hip Unilat W or Wo Pelvis 2-3 Views Left  Addendum Date: 08/19/2022   ADDENDUM REPORT: 08/19/2022 22:32 ADDENDUM: Please note findings and impression should state acute displaced left femoral neck fracture (NOT humeral). Electronically Signed   By: Iven Finn M.D.   On: 08/19/2022 22:32   Result Date: 08/19/2022 CLINICAL DATA:  fall EXAM: DG HIP (WITH OR WITHOUT PELVIS) 2-3V LEFT COMPARISON:  CT abdomen pelvis 02/19/2018 FINDINGS: Acute displaced left humeral neck fracture that is poorly visualized. No dislocation of the  left hip. No acute displaced fracture or dislocation of the right hip on the frontal view. No acute displaced fracture or diastasis of the bones of the pelvis. There is no evidence of severe arthropathy or other focal bone abnormality. Degenerative changes visualized of the lower lumbar spine. IMPRESSION: Acute displaced left humeral neck fracture that is poorly visualized. Electronically Signed: By: Iven Finn M.D. On: 08/19/2022 22:09   DG Chest 1 View  Result Date: 08/19/2022 CLINICAL DATA:  [id number].  Fall EXAM: CHEST  1 VIEW COMPARISON:  None Available. FINDINGS: The heart and mediastinal contours is grossly unchanged with limited evaluation due to patient rotation and overlying right upper lobe disease and scarring. Prominent hilar vasculature. Redemonstration of right upper lobe architectural torsion/scarring and wedge resection with known bronchiectasis not well visualized. Interval development of diffuse increased interstitial and airspace opacities. Nonspecific blunting of the right costophrenic angle. No definite pleural effusion. No pneumothorax. No acute osseous abnormality. IMPRESSION: Interval development of diffuse increased interstitial and airspace opacities with underlying architectural scarring and wedge resection in the right upper lobe. Findings likely represent pulmonary edema with superimposed infection not excluded. Electronically Signed   By: Iven Finn M.D.   On: 08/19/2022 22:22     Assessment and Plan:   Left hip fracture status post mechanical fall -presented to the hospital status post mechanical fall -Etiology of fall unclear -Followed by orthopedics -Cardiology consult for surgical clearance -Surgery scheduled for tomorrow  Acute on chronic diastolic congestive heart failure -BNP 465.7 slightly elevated from previous 300 -Echocardiogram ordered and pending -PTA furosemide and amlodipine have been placed on hold -Mild hypoxia at the time of admission placed  on oxygen -Given diuretic therapy sparingly -States that breathing has improved -Daily weight, INO, low-sodium diet  Acute kidney injury -Serum creatinine 0.80 -On admission after Lasix was seen creatinine 1.21 -So improved back to baseline -Daily BMP -Monitor/trend/replete electrolytes as needed  COPD -Not currently in exacerbation -Continue to titrate FiO2 to keep sats greater than equal to 92% -Insulin per IM  Hypokalemia -Serum potassium 3.4 -Potassium supplements ordered by IM -Daily BMP -Monitor/trend/repeat pleat electrolytes as needed  Parkinson's disease -Continued on carbidopa levodopa -Management per IM team     Crystal Bass's perioperative risk of a major cardiac event is 0.9% according to the Revised Cardiac Risk Index (RCRI).  Therefore, she is  at high risk for perioperative complications.   Her functional capacity is poor at 3.97 METs according to the Duke Activity Status Index (DASI). Recommendations: The patient is at high risk for perioperative cardiac complications and is at a low functional capacity.  However, further testing will not change how her cardiac status is managed.  Proceed with surgery at high risk if there are no other options for treatment.  Risk Assessment/Risk Scores:                For questions or updates, please contact [place] Please consult [email or web address] for contact info under    Signed, Yarisbel Miranda, NP  08/20/2022 3:27 PM

## 2022-08-20 NOTE — Consult Note (Signed)
ORTHOPAEDIC CONSULTATION  REQUESTING PHYSICIAN: Lavina Hamman, MD  Chief Complaint: Left hip pain status post fall  HPI: Crystal Bass is a 81 y.o. female who complains of significant left hip pain after a fall at home.  Her niece is at the bedside this morning.  X-rays in the [facility] regional emergency department demonstrate a displaced left femoral neck hip fracture.  Patient is admitted to the hospital service and orthopedics is consulted for management of her hip fracture.  Patient is seen this morning with Dr. Posey Pronto.  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   Anginal pain ([place])    Aortic root aneurysm ([facility])    Arthritis    Asthma    Breast cancer ([place]) 2002   infiltrative ductal carcinoma    Cataract 2019   COPD (chronic obstructive pulmonary disease) ([place]) 07/2007   Ductal carcinoma of breast, estrogen receptor positive, stage 1 ([place]) 09/16/2012   Dyspnea    Endometriosis    GERD (gastroesophageal reflux disease)    Hypercalcemia 09/14/2013   Hypertension    Lesion of breast 1992   right, benign   Lung disease    secondary to [name] infection   Mastalgia 05/1994   Osteoporosis, post-menopausal 03/14/2012   Ovarian tumor of borderline malignancy, right 2004   Pancreatitis    secondary to Cholelithiasis   Parkinson disease ([place]) 02/10/2015   Post-thoracotomy pain syndrome    Pseudomonas pneumonia ([place]) 03/2008   Status post implantation of mitral valve leaflet clip 02/15/2022   s/p XTW MitraClip x2 by Dr. Burt Knack in the A2/P2 position   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   APPENDECTOMY  2004   BREAST BIOPSY  08/2004   right breast-benign    BREAST LUMPECTOMY  1992   benign   BUBBLE STUDY  12/27/2021   Procedure: BUBBLE STUDY;  Surgeon: Geralynn Rile, MD;  Location: [facility];  Service: Cardiovascular;;   CARDIAC CATHETERIZATION  2007   essentially negation for significant CAD   CHOLECYSTECTOMY  2001   COLONOSCOPY  2014   Dr [name] , due 2019   COLONOSCOPY  09/2018   2 TA removed, int hem, no f/u needed (Dr [name])    ESOPHAGOGASTRODUODENOSCOPY  09/2018   GERD with esophagitis and stricture dilated, antral gastritis negative for H pylori [name])   LUNG BIOPSY  2002   [name], Dr [name]   MASTECTOMY MODIFIED RADICAL Left 2002   oral chemotheraphy (tamoxifen then Armidex) no radiation, Dr.[name]   MITRAL VALVE REPAIR N/A 02/15/2022   Procedure: MITRAL VALVE REPAIR;  Surgeon: Sherren Mocha, MD;  Location: [place] [facility];  Service: Cardiovascular;  Laterality: N/A;   RIGHT/LEFT HEART CATH AND CORONARY ANGIOGRAPHY Bilateral 11/14/2021   Procedure: RIGHT/LEFT HEART CATH AND CORONARY ANGIOGRAPHY;  Surgeon: Wellington Hampshire, MD;  Location: [place] [facility];  Service: Cardiovascular;  Laterality: Bilateral;   TEE WITHOUT CARDIOVERSION N/A 12/29/2018   Procedure: TRANSESOPHAGEAL ECHOCARDIOGRAM (TEE);  Surgeon: Wellington Hampshire, MD;  Location: [facility] ORS;  Service: Cardiovascular;  Laterality: N/A;   TEE WITHOUT CARDIOVERSION N/A 12/27/2021   Procedure: TRANSESOPHAGEAL ECHOCARDIOGRAM (TEE);  Surgeon:  Geralynn Rile, MD;  Location: [place];  Service: Cardiovascular;  Laterality: N/A;   TEE WITHOUT CARDIOVERSION N/A 02/15/2022   Procedure: TRANSESOPHAGEAL ECHOCARDIOGRAM (TEE);  Surgeon: Sherren Mocha, MD;  Location: [place] [facility];  Service: Cardiovascular;  Laterality: N/A;   TEE WITHOUT CARDIOVERSION N/A 05/14/2022   Procedure: TRANSESOPHAGEAL ECHOCARDIOGRAM (TEE);  Surgeon: Geralynn Rile, MD;  Location: [place];  Service: Cardiovascular;  Laterality: N/A;   TONSILLECTOMY  1946   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  Tobacco Use   Smoking status: Never   Smokeless tobacco: Never  Vaping Use   Vaping Use: Never used  Substance and Sexual Activity   Alcohol use: No    Alcohol/week: 0.0 standard drinks of alcohol   Drug use: No   Sexual activity: Not Currently    Birth control/protection: Surgical    Comment: TAH/BSO  Other Topics Concern   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    Aunt of Dr Margaretmary Eddy    Lives independent living at [facility]    No children    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, 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, 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, 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)    Activity: walking about 1 mile/day, enjoys [employer]    Diet: some water, fruits/vegetables some    Right handed    Social Determinants of Health   Financial Resource Strain: Low Risk  (08/13/2022)   Overall Financial Resource Strain (CARDIA)    Difficulty of Paying Living Expenses: Not hard at all  Food Insecurity: No Food Insecurity (08/13/2022)   Hunger Vital Sign    Worried About Running Out of Food in the Last Year: Never true    [place] in the Last Year: Never true  Transportation Needs: No Transportation Needs (08/13/2022)   PRAPARE - Hydrologist (Medical): No    Lack of Transportation (Non-Medical): No  Physical Activity: Insufficiently Active (08/13/2022)   Exercise Vital Sign    Days of Exercise per Week: 4 days    Minutes of Exercise per Session: 30 min  Stress: No Stress Concern Present (08/13/2022)   [place]    Feeling of Stress : Not at all  Social Connections: Socially Isolated (08/13/2022)   Social Connection and Isolation Panel [NHANES]     Frequency of Communication with Friends and Family: More than three times a week    Frequency of Social Gatherings with Friends and Family: Once a week    Attends Religious Services: Never    Marine scientist or Organizations: No    Attends Archivist Meetings: Never    Marital Status: Widowed   Family History  Problem Relation Age of Onset   Emphysema Mother        d.64 was never a smoker   Lung cancer Father 70       d.82 history of smoking   Breast cancer Sister 49   Colon cancer Sister    Melanoma Maternal Aunt    Liver cancer Maternal Aunt    Cancer Maternal Uncle        unspecified type   Cancer Maternal Uncle  unspecified type   Cancer Maternal Uncle        unspecified type   Stroke Paternal Aunt        in mid 59s   Osteoporosis Paternal Aunt    Myasthenia gravis Paternal Aunt    Cancer Maternal Grandmother        d.82s unspecified GI cancer   Cancer Maternal Grandfather        d.62s unspecified type   Breast cancer Cousin        d.60s-daughter of unaffected paternal aunt Eustace Moore   Colon cancer Cousin 31       d.70-daughter of unaffected maternal aunt Leila   Lung cancer Cousin 70       d.70-sisters to each other, both daughters of maternal uncle Johnny   Parkinson's disease Cousin    Diabetes Neg Hx    Heart disease Neg Hx    Stomach cancer Neg Hx    Ulcerative colitis Neg Hx    Allergies  Allergen Reactions   Sulfa Antibiotics Anaphylaxis   Sulfonamide Derivatives Anaphylaxis   Topamax [Topiramate] Other (See Comments)    Metabolic acidosis    Clarithromycin Other (See Comments)    pericarditis Other reaction(s): Not available   Hydrocodone Other (See Comments)    "hyper and climbing the walls"   Motrin [Ibuprofen] Other (See Comments)    headaches   Misc. Sulfonamide Containing Compounds     Other reaction(s): Not available   Symbicort [Budesonide-Formoterol Fumarate] Other (See Comments)    02/07/15 tremor   Percocet  [Oxycodone-Acetaminophen] Itching and Rash   Tape Itching and Rash    Use paper tape only   Tetracyclines & Related Other (See Comments)    "immediate yeast infection"   Prior to Admission medications   Medication Sig Start Date End Date Taking? Authorizing Provider  amLODipine (NORVASC) 2.5 MG tablet Take 1 tablet (2.5 mg total) by mouth daily. 08/09/22  Yes Wellington Hampshire, MD  aspirin 81 MG tablet Take 1 tablet (81 mg total) by mouth daily. 01/27/19  Yes [name], MD  Carbidopa-Levodopa ER (SINEMET CR) 25-100 MG tablet controlled release TAKE 2 TABLET BY MOUTH AT 9 AM, 1 TAB AT1 PM AND 1 TAB AT 5 PM 06/13/22  Yes Tat, Eustace Quail, DO  clonazePAM (KLONOPIN) 0.5 MG tablet TAKE 1/2 A TABLET BY MOUTH AT BEDTIME 06/13/22  Yes Tat, Eustace Quail, DO  fexofenadine (ALLEGRA) 180 MG tablet Take 180 mg by mouth daily.   Yes [provider]  Fluticasone-Umeclidin-Vilant (TRELEGY ELLIPTA) 100-62.5-25 MCG/ACT AEPB Inhale 1 puff into the lungs daily. 04/27/22  Yes Cobb, Karie Schwalbe, NP  furosemide (LASIX) 20 MG tablet Take 1 tablet (20 mg total) by mouth daily. 04/30/22  Yes Eileen Stanford, PA-C  gabapentin ([name]) 300 MG capsule TAKE 2 CAPSULES BY MOUTH 3 TIMES DAILY 06/04/22  Yes [name], MD  pantoprazole (PROTONIX) 40 MG tablet Take 1 tablet (40 mg total) by mouth daily. 05/15/22 02/09/23 Yes Eileen Stanford, PA-C  potassium chloride SA (KLOR-CON M) 20 MEQ tablet Take 1 tablet (20 mEq total) by mouth daily. 06/15/22  Yes Kathyrn Drown D, NP  acetaminophen (TYLENOL) 500 MG tablet Take 2 tablets (1,000 mg total) by mouth 2 (two) times daily as needed. 01/21/18   [name], MD  albuterol (VENTOLIN HFA) 108 (90 Base) MCG/ACT inhaler Inhale 2 puffs into the lungs every 6 (six) hours as needed for wheezing or shortness of breath. 06/04/22   [name], MD  amoxicillin (  AMOXIL) 500 MG capsule Take 4 capsules (2,000 mg total) by mouth as directed. Take 4 tablets 1 hour prior  to dental work, including cleanings. Patient not taking: Reported on 08/13/2022 02/26/22   Kathyrn Drown D, NP  benzocaine-menthol (CHLORASEPTIC) 6-10 MG lozenge Take 1 lozenge by mouth as needed for sore throat. Patient not taking: Reported on 08/13/2022 02/16/22   Kathyrn Drown D, NP  EPINEPHrine 0.3 mg/0.3 mL IJ SOAJ injection Inject 0.3 mg into the muscle as needed for anaphylaxis. 02/25/20   [provider]   DG Hip Unilat W or Wo Pelvis 2-3 Views Left  Addendum Date: 08/19/2022   ADDENDUM REPORT: 08/19/2022 22:32 ADDENDUM: Please note findings and impression should state acute displaced left femoral neck fracture (NOT humeral). Electronically Signed   By: Iven Finn M.D.   On: 08/19/2022 22:32   Result Date: 08/19/2022 CLINICAL DATA:  fall EXAM: DG HIP (WITH OR WITHOUT PELVIS) 2-3V LEFT COMPARISON:  CT abdomen pelvis 02/19/2018 FINDINGS: Acute displaced left humeral neck fracture that is poorly visualized. No dislocation of the left hip. No acute displaced fracture or dislocation of the right hip on the frontal view. No acute displaced fracture or diastasis of the bones of the pelvis. There is no evidence of severe arthropathy or other focal bone abnormality. Degenerative changes visualized of the lower lumbar spine. IMPRESSION: Acute displaced left humeral neck fracture that is poorly visualized. Electronically Signed: By: Iven Finn M.D. On: 08/19/2022 22:09   DG Chest 1 View  Result Date: 08/19/2022 CLINICAL DATA:  [id number].  Fall EXAM: CHEST  1 VIEW COMPARISON:  None Available. FINDINGS: The heart and mediastinal contours is grossly unchanged with limited evaluation due to patient rotation and overlying right upper lobe disease and scarring. Prominent hilar vasculature. Redemonstration of right upper lobe architectural torsion/scarring and wedge resection with known bronchiectasis not well visualized. Interval development of diffuse increased interstitial and airspace opacities.  Nonspecific blunting of the right costophrenic angle. No definite pleural effusion. No pneumothorax. No acute osseous abnormality. IMPRESSION: Interval development of diffuse increased interstitial and airspace opacities with underlying architectural scarring and wedge resection in the right upper lobe. Findings likely represent pulmonary edema with superimposed infection not excluded. Electronically Signed   By: Iven Finn M.D.   On: 08/19/2022 22:22    Positive ROS: All other systems have been reviewed and were otherwise negative with the exception of those mentioned in the HPI and as above.  Physical Exam: General: Alert, no acute distress  MUSCULOSKELETAL: Left lower extremity: Patient has shortening and external rotation of the left lower extremity.  Skin is intact.  There is no erythema ecchymosis or significant swelling.  Her thigh compartments are soft and compressible.  Distally she is neurovascular intact.  Assessment: Left displaced femoral neck hip fracture  Plan: I reviewed with the patient and her niece the details of the fracture.  I have recommended a left hip hemiarthroplasty.  Patient will require cardiology clearance Dr. Posey Pronto.  Patient will have her surgery performed tomorrow pending medical clearance.  I discussed the risks and benefits of surgery. The risks include but are not limited to infection, bleeding requiring blood transfusion, nerve or blood vessel injury, joint stiffness or loss of motion, persistent pain, weakness or instability, location, fracture, leg length discrepancy, change in lower extremity rotation hardware failure and the need for further surgery. Medical risks include but are not limited to DVT and pulmonary embolism, myocardial infarction, stroke, pneumonia, respiratory failure and death. Patient understood these  risks and wished to proceed.     Thornton Park, MD    08/20/2022 9:23 AM

## 2022-08-20 NOTE — Hospital Course (Signed)
PMH of Parkinson's disease, severe mitral regurgitation SP MitraClip with chronic HF PEF, HTN, COPD, asthma present to the hospital with complaints of a fall.  Etiology of the fall is not clear.  She was walking and lost her balance and fell on the left side. Appears to have acute displaced left femoral neck fracture.  Orthopedic consulted.  Scheduled for bipolar hemiarthroplasty 9/26. Also has acute on chronic HFpEF.  Cardiology consult for preop clearance.

## 2022-08-20 NOTE — Assessment & Plan Note (Signed)
-   We will continue her amlodipine.

## 2022-08-20 NOTE — Assessment & Plan Note (Signed)
-   We will place her on as needed DuoNebs and hold off long-acting beta agonist in the setting of acute CHF.

## 2022-08-21 ENCOUNTER — Inpatient Hospital Stay: Payer: Medicare PPO | Admitting: Anesthesiology

## 2022-08-21 ENCOUNTER — Encounter
Admission: EM | Disposition: A | Discharge status: Skilled Nursing Facility | Payer: Self-pay | Source: Skilled Nursing Facility | Attending: Internal Medicine

## 2022-08-21 ENCOUNTER — Encounter: Payer: Self-pay | Admitting: Family Medicine

## 2022-08-21 ENCOUNTER — Inpatient Hospital Stay: Payer: Medicare PPO

## 2022-08-21 ENCOUNTER — Other Ambulatory Visit: Payer: Self-pay

## 2022-08-21 DIAGNOSIS — S72002A Fracture of unspecified part of neck of left femur, initial encounter for closed fracture: Secondary | ICD-10-CM | POA: Diagnosis not present

## 2022-08-21 DIAGNOSIS — N179 Acute kidney failure, unspecified: Secondary | ICD-10-CM | POA: Diagnosis present

## 2022-08-21 HISTORY — PX: HIP ARTHROPLASTY: SHX981

## 2022-08-21 LAB — CBC
HCT: 37.1 % (ref 36.0–46.0)
Hemoglobin: 11.5 g/dL — ABNORMAL LOW (ref 12.0–15.0)
MCH: 29.5 pg (ref 26.0–34.0)
MCHC: 31 g/dL (ref 30.0–36.0)
MCV: 95.1 fL (ref 80.0–100.0)
Platelets: 123 10*3/uL — ABNORMAL LOW (ref 150–400)
RBC: 3.9 MIL/uL (ref 3.87–5.11)
RDW: 15.2 % (ref 11.5–15.5)
WBC: 7.9 10*3/uL (ref 4.0–10.5)
nRBC: 0 % (ref 0.0–0.2)

## 2022-08-21 LAB — BASIC METABOLIC PANEL
Anion gap: 6 (ref 5–15)
BUN: 21 mg/dL (ref 8–23)
CO2: 28 mmol/L (ref 22–32)
Calcium: 9.1 mg/dL (ref 8.9–10.3)
Chloride: 104 mmol/L (ref 98–111)
Creatinine, Ser: 0.77 mg/dL (ref 0.44–1.00)
GFR, Estimated: >60 mL/min (ref >60)
Glucose, Bld: 107 mg/dL — ABNORMAL HIGH (ref 70–99)
Potassium: 4.8 mmol/L (ref 3.5–5.1)
Sodium: 138 mmol/L (ref 135–145)

## 2022-08-21 LAB — HEMOGLOBIN AND HEMATOCRIT, BLOOD
HCT: 31.5 % — ABNORMAL LOW (ref 36.0–46.0)
Hemoglobin: 9.9 g/dL — ABNORMAL LOW (ref 12.0–15.0)

## 2022-08-21 LAB — MAGNESIUM: Magnesium: 2.3 mg/dL (ref 1.7–2.4)

## 2022-08-21 SURGERY — HEMIARTHROPLASTY, HIP, DIRECT ANTERIOR APPROACH, FOR FRACTURE
Anesthesia: General | Site: Hip | Laterality: Left

## 2022-08-21 MED ORDER — FENTANYL CITRATE (PF) 100 MCG/2ML IJ SOLN
INTRAMUSCULAR | Status: AC
  Filled 2022-08-21: qty 2

## 2022-08-21 MED ORDER — MENTHOL 3 MG MT LOZG: 1.0000 | LOZENGE | OROMUCOSAL | Status: DC | PRN

## 2022-08-21 MED ORDER — TRAMADOL HCL 50 MG PO TABS
50.0000 mg | ORAL_TABLET | Freq: Four times a day (QID) | ORAL | Status: DC
  Administered 2022-08-21 – 2022-08-23 (×8): 50 mg via ORAL
  Filled 2022-08-21 (×8): qty 1

## 2022-08-21 MED ORDER — ACETAMINOPHEN 10 MG/ML IV SOLN
INTRAVENOUS | Status: DC | PRN
  Administered 2022-08-21: 1000 mg via INTRAVENOUS

## 2022-08-21 MED ORDER — BISACODYL 10 MG RE SUPP
10.0000 mg | Freq: Every day | RECTAL | Status: DC | PRN
  Administered 2022-08-23: 10 mg via RECTAL
  Filled 2022-08-21: qty 1

## 2022-08-21 MED ORDER — METHOCARBAMOL 500 MG PO TABS
500.0000 mg | ORAL_TABLET | Freq: Four times a day (QID) | ORAL | Status: DC | PRN
  Administered 2022-08-22: 500 mg via ORAL
  Filled 2022-08-21: qty 1

## 2022-08-21 MED ORDER — METHOCARBAMOL 1000 MG/10ML IJ SOLN: 500.0000 mg | Freq: Four times a day (QID) | INTRAVENOUS | Status: DC | PRN

## 2022-08-21 MED ORDER — CEFAZOLIN SODIUM-DEXTROSE 2-4 GM/100ML-% IV SOLN
2.0000 g | INTRAVENOUS | Status: AC
  Administered 2022-08-21: 2 g via INTRAVENOUS
  Filled 2022-08-21: qty 100

## 2022-08-21 MED ORDER — HYDROMORPHONE HCL 2 MG PO TABS
1.0000 mg | ORAL_TABLET | ORAL | Status: DC | PRN
  Administered 2022-08-23: 2 mg via ORAL
  Filled 2022-08-21: qty 1

## 2022-08-21 MED ORDER — ACETAMINOPHEN 10 MG/ML IV SOLN
INTRAVENOUS | Status: AC
  Filled 2022-08-21: qty 100

## 2022-08-21 MED ORDER — LIDOCAINE HCL (CARDIAC) PF 100 MG/5ML IV SOSY
PREFILLED_SYRINGE | INTRAVENOUS | Status: DC | PRN
  Administered 2022-08-21: 60 mg via INTRAVENOUS

## 2022-08-21 MED ORDER — CHLORHEXIDINE GLUCONATE 0.12 % MT SOLN
15.0000 mL | Freq: Two times a day (BID) | OROMUCOSAL | Status: DC
  Administered 2022-08-21 – 2022-08-24 (×7): 15 mL via OROMUCOSAL
  Filled 2022-08-21 (×7): qty 15

## 2022-08-21 MED ORDER — CEFAZOLIN SODIUM-DEXTROSE 2-4 GM/100ML-% IV SOLN
2.0000 g | Freq: Four times a day (QID) | INTRAVENOUS | Status: AC
  Administered 2022-08-21 – 2022-08-22 (×2): 2 g via INTRAVENOUS
  Filled 2022-08-21 (×2): qty 100

## 2022-08-21 MED ORDER — POLYETHYLENE GLYCOL 3350 17 G PO PACK
17.0000 g | PACK | Freq: Every day | ORAL | Status: DC | PRN
  Administered 2022-08-22 – 2022-08-23 (×2): 17 g via ORAL
  Filled 2022-08-21 (×2): qty 1

## 2022-08-21 MED ORDER — NEOMYCIN-POLYMYXIN B GU 40-200000 IR SOLN
Status: AC
  Filled 2022-08-21: qty 20

## 2022-08-21 MED ORDER — MIDAZOLAM HCL 2 MG/2ML IJ SOLN
INTRAMUSCULAR | Status: DC | PRN
  Administered 2022-08-21: 1 mg via INTRAVENOUS

## 2022-08-21 MED ORDER — ENOXAPARIN SODIUM 40 MG/0.4ML IJ SOSY
40.0000 mg | PREFILLED_SYRINGE | INTRAMUSCULAR | Status: DC
  Administered 2022-08-22 – 2022-08-24 (×3): 40 mg via SUBCUTANEOUS
  Filled 2022-08-21 (×3): qty 0.4

## 2022-08-21 MED ORDER — ACETAMINOPHEN 500 MG PO TABS
1000.0000 mg | ORAL_TABLET | Freq: Four times a day (QID) | ORAL | Status: AC
  Administered 2022-08-22 (×2): 1000 mg via ORAL
  Filled 2022-08-21 (×3): qty 2

## 2022-08-21 MED ORDER — DOCUSATE SODIUM 100 MG PO CAPS
100.0000 mg | ORAL_CAPSULE | Freq: Two times a day (BID) | ORAL | Status: DC
  Administered 2022-08-21 – 2022-08-24 (×6): 100 mg via ORAL
  Filled 2022-08-21 (×6): qty 1

## 2022-08-21 MED ORDER — HEMOSTATIC AGENTS (NO CHARGE) OPTIME
TOPICAL | Status: DC | PRN
  Administered 2022-08-21: 1 via TOPICAL

## 2022-08-21 MED ORDER — VASOPRESSIN 20 UNIT/ML IV SOLN
INTRAVENOUS | Status: DC | PRN
  Administered 2022-08-21 (×4): 1 [IU] via INTRAVENOUS

## 2022-08-21 MED ORDER — GABAPENTIN 600 MG PO TABS
600.0000 mg | ORAL_TABLET | Freq: Three times a day (TID) | ORAL | Status: DC
  Administered 2022-08-22 – 2022-08-24 (×7): 600 mg via ORAL
  Filled 2022-08-21 (×7): qty 1

## 2022-08-21 MED ORDER — FERROUS SULFATE 325 (65 FE) MG PO TABS
325.0000 mg | ORAL_TABLET | Freq: Three times a day (TID) | ORAL | Status: DC
  Administered 2022-08-21 – 2022-08-24 (×8): 325 mg via ORAL
  Filled 2022-08-21 (×8): qty 1

## 2022-08-21 MED ORDER — ONDANSETRON HCL 4 MG/2ML IJ SOLN: 4.0000 mg | Freq: Once | INTRAMUSCULAR | Status: DC | PRN

## 2022-08-21 MED ORDER — SENNA 8.6 MG PO TABS
1.0000 | ORAL_TABLET | Freq: Two times a day (BID) | ORAL | Status: DC
  Administered 2022-08-21 – 2022-08-24 (×6): 8.6 mg via ORAL
  Filled 2022-08-21 (×6): qty 1

## 2022-08-21 MED ORDER — SODIUM CHLORIDE 0.9 % IR SOLN
Status: DC | PRN
  Administered 2022-08-21: 3012 mL

## 2022-08-21 MED ORDER — FENTANYL CITRATE (PF) 100 MCG/2ML IJ SOLN: 25.0000 ug | INTRAMUSCULAR | Status: DC | PRN

## 2022-08-21 MED ORDER — ONDANSETRON HCL 4 MG/2ML IJ SOLN
INTRAMUSCULAR | Status: DC | PRN
  Administered 2022-08-21: 4 mg via INTRAVENOUS

## 2022-08-21 MED ORDER — FENTANYL CITRATE (PF) 100 MCG/2ML IJ SOLN
INTRAMUSCULAR | Status: DC | PRN
  Administered 2022-08-21: 50 ug via INTRAVENOUS

## 2022-08-21 MED ORDER — SUGAMMADEX SODIUM 200 MG/2ML IV SOLN
INTRAVENOUS | Status: DC | PRN
  Administered 2022-08-21: 200 mg via INTRAVENOUS

## 2022-08-21 MED ORDER — DEXAMETHASONE SODIUM PHOSPHATE 10 MG/ML IJ SOLN
INTRAMUSCULAR | Status: DC | PRN
  Administered 2022-08-21: 5 mg via INTRAVENOUS

## 2022-08-21 MED ORDER — GELATIN ABSORBABLE 12-7 MM EX MISC
CUTANEOUS | Status: AC
  Filled 2022-08-21: qty 1

## 2022-08-21 MED ORDER — SODIUM CHLORIDE 0.9 % IR SOLN
Status: DC | PRN
  Administered 2022-08-21: 1004 mL

## 2022-08-21 MED ORDER — ONDANSETRON HCL 4 MG/2ML IJ SOLN: 4.0000 mg | Freq: Four times a day (QID) | INTRAMUSCULAR | Status: DC | PRN

## 2022-08-21 MED ORDER — PROPOFOL 10 MG/ML IV BOLUS
INTRAVENOUS | Status: DC | PRN
  Administered 2022-08-21: 70 mg via INTRAVENOUS

## 2022-08-21 MED ORDER — SODIUM CHLORIDE 0.9 % IV SOLN: INTRAVENOUS | Status: DC

## 2022-08-21 MED ORDER — ALUM & MAG HYDROXIDE-SIMETH 200-200-20 MG/5ML PO SUSP: 30.0000 mL | ORAL | Status: DC | PRN

## 2022-08-21 MED ORDER — PHENOL 1.4 % MT LIQD: 1.0000 | OROMUCOSAL | Status: DC | PRN

## 2022-08-21 MED ORDER — LACTATED RINGERS IV SOLN: INTRAVENOUS | Status: DC | PRN

## 2022-08-21 MED ORDER — MIDAZOLAM HCL 2 MG/2ML IJ SOLN
INTRAMUSCULAR | Status: AC
  Filled 2022-08-21: qty 2

## 2022-08-21 MED ORDER — PROPOFOL 10 MG/ML IV BOLUS
INTRAVENOUS | Status: AC
  Filled 2022-08-21: qty 20

## 2022-08-21 MED ORDER — TRANEXAMIC ACID-NACL 1000-0.7 MG/100ML-% IV SOLN
1000.0000 mg | Freq: Once | INTRAVENOUS | Status: AC
  Administered 2022-08-21: 1000 mg via INTRAVENOUS
  Filled 2022-08-21: qty 100

## 2022-08-21 MED ORDER — ONDANSETRON HCL 4 MG PO TABS: 4.0000 mg | ORAL_TABLET | Freq: Four times a day (QID) | ORAL | Status: DC | PRN

## 2022-08-21 MED ORDER — ROCURONIUM BROMIDE 100 MG/10ML IV SOLN
INTRAVENOUS | Status: DC | PRN
  Administered 2022-08-21: 40 mg via INTRAVENOUS
  Administered 2022-08-21 (×2): 30 mg via INTRAVENOUS

## 2022-08-21 SURGICAL SUPPLY — 56 items
BLADE SAGITTAL WIDE XTHICK NO (BLADE) ×2 IMPLANT
BLADE SURG SZ10 CARB STEEL (BLADE) ×2 IMPLANT
BNDG CMPR 5X4 CHSV STRCH STRL (GAUZE/BANDAGES/DRESSINGS) ×1
BNDG COHESIVE 4X5 TAN STRL LF (GAUZE/BANDAGES/DRESSINGS) ×2 IMPLANT
COVER BACK TABLE REUSABLE LG (DRAPES) ×2 IMPLANT
DRAPE 3/4 80X56 (DRAPES) ×4 IMPLANT
DRAPE INCISE IOBAN 66X60 STRL (DRAPES) ×2 IMPLANT
DRAPE ORTHO SPLIT 77X108 STRL (DRAPES) ×2
DRAPE SURG 17X11 SM STRL (DRAPES) ×2 IMPLANT
DRAPE SURG ORHT 6 SPLT 77X108 (DRAPES) ×4 IMPLANT
DRAPE U-SHAPE 48X52 POLY STRL (PACKS) ×2 IMPLANT
DRSG AQUACEL AG ADV 3.5X14 (GAUZE/BANDAGES/DRESSINGS) IMPLANT
DRSG OPSITE POSTOP 4X10 (GAUZE/BANDAGES/DRESSINGS) ×2 IMPLANT
DURAPREP 26ML APPLICATOR (WOUND CARE) ×8 IMPLANT
ELECT CAUTERY BLADE 6.4 (BLADE) ×2 IMPLANT
ELECT REM PT RETURN 9FT ADLT (ELECTROSURGICAL) ×1 IMPLANT
ELECTRODE REM PT RTRN 9FT ADLT (ELECTROSURGICAL) ×2 IMPLANT
GAUZE 4X4 16PLY ~~LOC~~+RFID DBL (SPONGE) ×2 IMPLANT
GAUZE XEROFORM 1X8 LF (GAUZE/BANDAGES/DRESSINGS) ×4 IMPLANT
GLOVE BIOGEL PI IND STRL 9 (GLOVE) ×2 IMPLANT
GLOVE BIOGEL PI ORTHO SZ9 (GLOVE) ×8 IMPLANT
GOWN STRL REUS TWL 2XL XL LVL4 (GOWN DISPOSABLE) ×2 IMPLANT
GOWN STRL REUS W/ TWL LRG LVL3 (GOWN DISPOSABLE) ×2 IMPLANT
GOWN STRL REUS W/TWL LRG LVL3 (GOWN DISPOSABLE) ×1
HEAD MODULAR ENDO (Orthopedic Implant) ×1 IMPLANT
HEAD UNPLR 46XMDLR STRL HIP (Orthopedic Implant) IMPLANT
HOLSTER ELECTROSUGICAL PENCIL (MISCELLANEOUS) ×2 IMPLANT
IV NS IRRIG 3000ML ARTHROMATIC (IV SOLUTION) ×2 IMPLANT
KIT TURNOVER KIT A (KITS) ×2 IMPLANT
MANIFOLD NEPTUNE II (INSTRUMENTS) ×2 IMPLANT
NDL FILTER BLUNT 18X1 1/2 (NEEDLE) ×2 IMPLANT
NDL MAYO CATGUT SZ4 TPR NDL (NEEDLE) ×2 IMPLANT
NDL SAFETY ECLIP 18X1.5 (MISCELLANEOUS) ×2 IMPLANT
NEEDLE FILTER BLUNT 18X1 1/2 (NEEDLE) IMPLANT
NEEDLE MAYO CATGUT SZ4 (NEEDLE) ×1 IMPLANT
NS IRRIG 1000ML POUR BTL (IV SOLUTION) ×2 IMPLANT
PACK HIP PROSTHESIS (MISCELLANEOUS) ×2 IMPLANT
PILLOW ABDUCTION FOAM SM (MISCELLANEOUS) ×2 IMPLANT
PULSAVAC PLUS IRRIG FAN TIP (DISPOSABLE) ×1 IMPLANT
RETRIEVER SUT HEWSON (MISCELLANEOUS) IMPLANT
SLEEVE UNITRAX V40 (Orthopedic Implant) ×1 IMPLANT
SLEEVE UNITRAX V40 +8 (Orthopedic Implant) IMPLANT
SPONGE SURGIFOAM ABS GEL 100 (HEMOSTASIS) IMPLANT
SPONGE T-LAP 18X18 ~~LOC~~+RFID (SPONGE) ×8 IMPLANT
STAPLER SKIN PROX 35W (STAPLE) ×2 IMPLANT
STEM HIP 127 DEG (Stem) IMPLANT
SUT TICRON 2-0 30IN 311381 (SUTURE) ×8 IMPLANT
SUT VIC AB 0 CT1 36 (SUTURE) ×2 IMPLANT
SUT VIC AB 2-0 CT2 27 (SUTURE) ×4 IMPLANT
SYR 10ML LL (SYRINGE) ×2 IMPLANT
TAPE TRANSPORE STRL 2 31045 (GAUZE/BANDAGES/DRESSINGS) ×2 IMPLANT
TIP BRUSH PULSAVAC PLUS 24.33 (MISCELLANEOUS) ×2 IMPLANT
TIP FAN IRRIG PULSAVAC PLUS (DISPOSABLE) ×2 IMPLANT
TRAP FLUID SMOKE EVACUATOR (MISCELLANEOUS) ×2 IMPLANT
TUBE SUCT KAM VAC (TUBING) IMPLANT
WATER STERILE IRR 500ML POUR (IV SOLUTION) ×2 IMPLANT

## 2022-08-21 NOTE — Anesthesia Procedure Notes (Addendum)
Procedure Name: Intubation Date/Time: 08/21/2022 2:40 PM  Performed by: Nelda Marseille, CRNAPre-anesthesia Checklist: Patient identified, Patient being monitored, Timeout performed, Emergency Drugs available and Suction available Patient Re-evaluated:Patient Re-evaluated prior to induction Oxygen Delivery Method: Circle System Utilized Preoxygenation: Pre-oxygenation with 100% oxygen Induction Type: IV induction Ventilation: Mask ventilation without difficulty Laryngoscope Size: 3 and McGraph Grade View: Grade I Tube type: Oral Tube size: 6.5 mm Number of attempts: 1 Airway Equipment and Method: Stylet Placement Confirmation: ETT inserted through vocal cords under direct vision, positive ETCO2, breath sounds checked- equal and bilateral and CO2 detector Secured at: 21 cm Tube secured with: Tape Dental Injury: Teeth and Oropharynx as per pre-operative assessment  Difficulty Due To: Difficulty was unanticipated Comments: Inserted by Laurence Spates

## 2022-08-21 NOTE — Progress Notes (Signed)
Subjective:  Patient is seen in the preoperative area with her family.  Patient has been cleared by medicine and cardiology for surgery.  Objective:   VITALS:   Vitals:   08/20/22 2104 08/21/22 0343 08/21/22 0922 08/21/22 1241  BP: 114/70 (!) 144/82 (!) 141/89 133/85  Pulse: 66 79 79 73  Resp: '17 20 16 16  '$ Temp: 99.3 F (37.4 C) 98.4 F (36.9 C) 99.4 F (37.4 C) 99 F (37.2 C)  TempSrc: Oral Oral  Temporal  SpO2: 96% 92% 97% 97%  Weight:    60.4 kg  Height:    '5\' 2"'$  (1.575 m)    PHYSICAL EXAM: Left lower extremity Skin is intact.  There is no erythema ecchymosis or significant swelling.  The compartments are soft and compressible.  Distally she is neurovascular intact.  There is shortening and external rotation to the left lower extremity.  LABS  Results for orders placed or performed during the hospital encounter of 08/19/22 (from the past 24 hour(s))  Basic metabolic panel     Status: Abnormal   Collection Time: 08/21/22  7:55 AM  Result Value Ref Range   Sodium 138 135 - 145 mmol/L   Potassium 4.8 3.5 - 5.1 mmol/L   Chloride 104 98 - 111 mmol/L   CO2 28 22 - 32 mmol/L   Glucose, Bld 107 (H) 70 - 99 mg/dL   BUN 21 8 - 23 mg/dL   Creatinine, Ser 0.77 0.44 - 1.00 mg/dL   Calcium 9.1 8.9 - 10.3 mg/dL   GFR, Estimated >60 >60 mL/min   Anion gap 6 5 - 15  CBC     Status: Abnormal   Collection Time: 08/21/22  7:55 AM  Result Value Ref Range   WBC 7.9 4.0 - 10.5 K/uL   RBC 3.90 3.87 - 5.11 MIL/uL   Hemoglobin 11.5 (L) 12.0 - 15.0 g/dL   HCT 37.1 36.0 - 46.0 %   MCV 95.1 80.0 - 100.0 fL   MCH 29.5 26.0 - 34.0 pg   MCHC 31.0 30.0 - 36.0 g/dL   RDW 15.2 11.5 - 15.5 %   Platelets 123 (L) 150 - 400 K/uL   nRBC 0.0 0.0 - 0.2 %  Magnesium     Status: None   Collection Time: 08/21/22  7:55 AM  Result Value Ref Range   Magnesium 2.3 1.7 - 2.4 mg/dL   *Note: Due to a large number of results and/or encounters for the requested time period, some results have not been  displayed. A complete set of results can be found in Results Review.    ECHOCARDIOGRAM COMPLETE  Result Date: 08/20/2022    ECHOCARDIOGRAM REPORT   Patient Name:   Crystal Bass Date of Exam: 08/20/2022 Medical Rec #:  [id number]         Height:       62.0 in Accession #:    [id number]        Weight:       132.2 lb Date of Birth:  [birth year]         BSA:          1.603 m Patient Age:    81 years          BP:           128/86 mmHg Patient Gender: F                 HR:           70  bpm. Exam Location:  [facility] Procedure: 2D Echo, Color Doppler and Cardiac Doppler Indications:     R06.00 Dyspnea  History:         Patient has prior history of Echocardiogram examinations, most                  recent 05/14/2022. MitraClip procedure 02/15/2022, COPD and                  History of left breast cancer; Risk Factors:Hypertension.  Sonographer:     Rosalia Hammers Referring Phys:  [id number] SHERI HAMMOCK Diagnosing Phys: [name] MD  Sonographer Comments: Image acquisition challenging due to mastectomy. IMPRESSIONS  1. Left ventricular ejection fraction, by estimation, is 60 to 65%. The left ventricle has normal function. The left ventricle has no regional wall motion abnormalities. There is mild left ventricular hypertrophy. Left ventricular diastolic parameters are consistent with Grade III diastolic dysfunction (restrictive).  2. Right ventricular systolic function is normal. The right ventricular size is normal. There is moderately elevated pulmonary artery systolic pressure.  3. Left atrial size was severely dilated.  4. The mitral valve is normal in structure. Moderate to severe mitral valve regurgitation. Moderate mitral stenosis. s/p mitral valve clip.  5. The aortic valve is normal in structure. Aortic valve regurgitation is not visualized. No aortic stenosis is present.  6. The inferior vena cava is normal in size with greater than 50% respiratory variability, suggesting right atrial pressure of 3 mmHg. FINDINGS   Left Ventricle: Left ventricular ejection fraction, by estimation, is 60 to 65%. The left ventricle has normal function. The left ventricle has no regional wall motion abnormalities. The left ventricular internal cavity size was normal in size. There is  mild left ventricular hypertrophy. Left ventricular diastolic parameters are consistent with Grade III diastolic dysfunction (restrictive). Right Ventricle: The right ventricular size is normal. No increase in right ventricular wall thickness. Right ventricular systolic function is normal. There is moderately elevated pulmonary artery systolic pressure. The tricuspid regurgitant velocity is 3.32 m/s, and with an assumed right atrial pressure of 3 mmHg, the estimated right ventricular systolic pressure is 10.9 mmHg. Left Atrium: Left atrial size was severely dilated. Right Atrium: Right atrial size was normal in size. Pericardium: There is no evidence of pericardial effusion. Mitral Valve: The mitral valve is normal in structure. Moderate to severe mitral valve regurgitation. Moderate mitral valve stenosis. Tricuspid Valve: The tricuspid valve is normal in structure. Tricuspid valve regurgitation is mild . No evidence of tricuspid stenosis. Aortic Valve: The aortic valve is normal in structure. Aortic valve regurgitation is not visualized. No aortic stenosis is present. Aortic valve mean gradient measures 4.0 mmHg. Aortic valve peak gradient measures 8.8 mmHg. Aortic valve area, by VTI measures 2.42 cm. Pulmonic Valve: The pulmonic valve was normal in structure. Pulmonic valve regurgitation is mild. No evidence of pulmonic stenosis. Aorta: The aortic root is normal in size and structure. Venous: The inferior vena cava is normal in size with greater than 50% respiratory variability, suggesting right atrial pressure of 3 mmHg. IAS/Shunts: No atrial level shunt detected by color flow Doppler.  LEFT VENTRICLE PLAX 2D LVIDd:         5.10 cm   Diastology LVIDs:          3.20 cm   LV e' medial:   3.81 cm/s LV PW:         1.40 cm   LV E/e' medial: 59.8 LV IVS:  1.10 cm LVOT diam:     1.90 cm LV SV:         62 LV SV Index:   39 LVOT Area:     2.84 cm  RIGHT VENTRICLE RV Basal diam:  3.80 cm RV Mid diam:    3.60 cm RV S prime:     17.70 cm/s TAPSE (M-mode): 2.5 cm LEFT ATRIUM              Index        RIGHT ATRIUM           Index LA diam:        4.40 cm  2.74 cm/m   RA Area:     18.10 cm LA Vol (A2C):   160.0 ml 99.80 ml/m  RA Volume:   48.70 ml  30.38 ml/m LA Vol (A4C):   118.0 ml 73.60 ml/m LA Biplane Vol: 143.0 ml 89.19 ml/m  AORTIC VALVE                    PULMONIC VALVE AV Area (Vmax):    2.59 cm     PR End Diast Vel: 6.25 msec AV Area (Vmean):   2.41 cm AV Area (VTI):     2.42 cm AV Vmax:           148.00 cm/s AV Vmean:          95.000 cm/s AV VTI:            0.257 m AV Peak Grad:      8.8 mmHg AV Mean Grad:      4.0 mmHg LVOT Vmax:         135.00 cm/s LVOT Vmean:        80.800 cm/s LVOT VTI:          0.219 m LVOT/AV VTI ratio: 0.85  AORTA Ao Root diam: 3.70 cm MITRAL VALVE                TRICUSPID VALVE MV Area (PHT): 3.89 cm     TR Peak grad:   44.1 mmHg MV Decel Time: 195 msec     TR Vmax:        332.00 cm/s MV E velocity: 228.00 cm/s MV A velocity: 82.10 cm/s   SHUNTS MV E/A ratio:  2.78         Systemic VTI:  0.22 m MV A Prime:    5.9 cm/s     Systemic Diam: 1.90 cm [name] MD Electronically signed by [name] MD Signature Date/Time: 08/20/2022/3:55:10 PM    Final    DG Hip Unilat W or Wo Pelvis 2-3 Views Left  Addendum Date: 08/19/2022   ADDENDUM REPORT: 08/19/2022 22:32 ADDENDUM: Please note findings and impression should state acute displaced left femoral neck fracture (NOT humeral). Electronically Signed   By: Iven Finn M.D.   On: 08/19/2022 22:32   Result Date: 08/19/2022 CLINICAL DATA:  fall EXAM: DG HIP (WITH OR WITHOUT PELVIS) 2-3V LEFT COMPARISON:  CT abdomen pelvis 02/19/2018 FINDINGS: Acute displaced left humeral neck  fracture that is poorly visualized. No dislocation of the left hip. No acute displaced fracture or dislocation of the right hip on the frontal view. No acute displaced fracture or diastasis of the bones of the pelvis. There is no evidence of severe arthropathy or other focal bone abnormality. Degenerative changes visualized of the lower lumbar spine. IMPRESSION: Acute displaced left humeral neck fracture that is poorly visualized. Electronically Signed:  By: Iven Finn M.D. On: 08/19/2022 22:09   DG Chest 1 View  Result Date: 08/19/2022 CLINICAL DATA:  [id number].  Fall EXAM: CHEST  1 VIEW COMPARISON:  None Available. FINDINGS: The heart and mediastinal contours is grossly unchanged with limited evaluation due to patient rotation and overlying right upper lobe disease and scarring. Prominent hilar vasculature. Redemonstration of right upper lobe architectural torsion/scarring and wedge resection with known bronchiectasis not well visualized. Interval development of diffuse increased interstitial and airspace opacities. Nonspecific blunting of the right costophrenic angle. No definite pleural effusion. No pneumothorax. No acute osseous abnormality. IMPRESSION: Interval development of diffuse increased interstitial and airspace opacities with underlying architectural scarring and wedge resection in the right upper lobe. Findings likely represent pulmonary edema with superimposed infection not excluded. Electronically Signed   By: Iven Finn M.D.   On: 08/19/2022 22:22    Assessment/Plan: Day of Surgery   Principal Problem:   Closed left hip fracture [facility]) Active Problems:   Essential hypertension   Chronic obstructive airway disease with asthma (HCC)   Acute on chronic diastolic CHF (congestive heart failure) (HCC)   Anxiety   Malnutrition of moderate degree  Patient has a displaced left femoral neck hip fracture.  She has been cleared for surgery.  The plan is for left hip hemiarthroplasty.  I  explained the details of the operation as well as the postoperative course with the patient and her family.  We have reviewed the risk and benefits of surgery as well.  I answered all the questions today.  I marked the left hip according hospital's correct surgery protocol.    Thornton Park , MD 08/21/2022, 2:19 PM

## 2022-08-21 NOTE — Anesthesia Procedure Notes (Signed)
Arterial Line Insertion Start/End9/26/2023 2:26 PM, 08/21/2022 2:36 PM Performed by: Arita Miss, MD, anesthesiologist  Preanesthetic checklist: patient identified, IV checked, site marked, risks and benefits discussed, surgical consent, monitors and equipment checked, pre-op evaluation and timeout performed Lidocaine 1% used for infiltration Right, radial was placed Catheter size: 20 G Hand hygiene performed  and maximum sterile barriers used  Allen's test indicative of satisfactory collateral circulation Attempts: 1 Procedure performed without using ultrasound guided technique. Following insertion, dressing applied. Post procedure assessment: normal  Patient tolerated the procedure well with no immediate complications.

## 2022-08-21 NOTE — Op Note (Signed)
08/21/2022  5:07 PM  PATIENT:  Crystal Bass   MRN: [id number]  PRE-OPERATIVE DIAGNOSIS:  Left Femoral Neck Fracture  POST-OPERATIVE DIAGNOSIS:  Left Femoral Neck Fracture   PROCEDURE:  Procedure(s): Left hip hemiarthroplasty  PREOPERATIVE INDICATIONS:    Crystal Bass is an 81 y.o. adult who was admitted with a diagnosis of left hip hemiarthroplasty.  I have recommended surgical fixation with hemiarthroplasty for this injury. I have explained the surgery and the postoperative course to the patient and their niece who have agreed with surgical management of this fracture.    The risks benefits and alternatives were discussed with the patient and their family including but not limited to the risks of  infection requiring removal of the prosthesis, bleeding requiring blood transfusion, nerve injury especially to the sciatic nerve leading to foot drop or lower extremity numbness, periprosthetic fracture, dislocation leg length discrepancy, change in lower extremity rotation persistent hip pain, loosening or failure of the components and the need for revision surgery. Medical risks include but are not limited to DVT and pulmonary embolism, myocardial infarction, stroke, pneumonia, respiratory failure and death.  OPERATIVE REPORT     SURGEON:  Thornton Park, MD    ASSISTANT: Olean Ree, RN first assist    ANESTHESIA: General    COMPLICATIONS:  None.   SPECIMEN: Femoral head to pathology    COMPONENTS:  [employer] Accolade 2 femoral component size 5 with a 46 mm + 8 neck adjustment sleeve.    PROCEDURE IN DETAIL:   The patient was met in the holding area and  identified.  I reviewed the details of the operation as well as the postoperative course with the patient and her family who is at the bedside. The left hip was identified and marked at the operative site after verbally confirming with the patient that this was the correct site of surgery.  The patient was then transported to  the OR and  underwent general anesthesia.  The patient was then placed in the lateral decubitus position with the operative side up and secured on the operating room table with a pegboard and all bony prominences were adequately padded. This included an axillary roll and additional padding around the nonoperative leg to prevent compression to the common peroneal nerve.    The operative lower extremity was prepped and draped in a sterile fashion.  A time out was performed prior to incision to verify patient's name, date of birth, medical record number, correct site of surgery correct procedure to be performed. The timeout was also used to verify the patient received antibiotics now appropriate instruments, implants and radiographic studies were available in the room. Once all in attendance were in agreement case began.    A posterolateral approach was utilized via sharp dissection  carried down to the subcutaneous tissue.  Bleeding vessels were coagulated using electrocautery.  The fascia lata was identified and incised along the length of the skin incision.  The gluteus maximus muscle was then split in line with its fibers. Self-retaining retractors were  inserted.  With the hip internally rotated, the short external rotators  were identified and removed from the posterior attachment from the greater trochanter. The piriformis was tagged for later repair. The capsule was identified and a T-shaped capsulotomy was performed. The capsule was tagged with #2 Tycron for later repair.  The femoral neck fracture was exposed, and the femoral head was removed using a corkscrew device. This was measured to be 46 mm in diameter. The  attention was then turned to proximal femur preparation.  An oscillating saw was used to perform a proximal femoral osteotomy 1 fingerbreadth above the lesser trochanter. The trial 46 mm femoral head was placed into the acetabulum and had an excellent suction fit. The attention was then turned  back to femoral preparation.    A femoral skid and Cobra retractor were placed under the femoral neck to allow for adequate visualization. A box osteotome was used to make the initial entry into the proximal femur. A single hand reamer was used to prepare the femoral canal. A T-shaped femoral canal sounder was then used to ensure no penetration femoral cortex had occurred during reaming. The proximal femur was then sequentially broached by hand. A size 5 femoral trial broach was found to have best medial to lateral canal fit. Once adequate mediolateral canal fill was achieved the trial femoral broach, neck, and head was assembled and the hip was reduced. It was found to have excellent stability, equivalent leg lengths with functional range of motion. The trial components were then removed.  I copiously irrigated the femoral canal and then impacted the real femoral prosthesis into place into the appropriate version, slightly anteverted to the normal anatomy, and I impacted the actual 46 mm Unitrax femoral component with a +8 neck adjustment sleeve into place. The hip was then reduced and taken through functional range of motion and found to have excellent stability. Leg lengths were restored. The hip joint was copiously irrigated.   A soft tissue repair of the capsule and external rotators was performed using #2 Tycron Excellent posterior capsular repair was achieved. The fascia lata was then closed with interrupted 0 Vicryl suture. The subcutaneous tissues were closed with 2-0 Vicryl and the skin approximated with staples.   The patient was then placed supine on the operative table. Leg lengths were checked clinically and found to be equivalent. An abduction pillow was placed between the lower extremities. The patient was then transferred to a hospital bed and brought to the PACU in stable condition. I was scrubbed and present the entire case and all sharp and instrument counts were correct at the conclusion  of the case. I spoke with the patient's family in the postop consultation room to let them know the case was performed without complication patient was stable in recovery room.   Timoteo Gaul, MD Orthopedic Surgeon

## 2022-08-21 NOTE — Progress Notes (Signed)
Aline removed per order. Pressure held.  No bleeding or swelling at site. Sterile gauze and tegaderm applied.

## 2022-08-21 NOTE — Progress Notes (Signed)
   Heart Failure Nurse Navigator Note  Attempted to meet with patient today, she was lying quietly in bed, in no acute distress.  They were no family members at the bedside.   Explained who I was and my purpose.  She asked that I come back at another time as she felt she was not receptive to teaching today.  Pricilla Riffle RN [facility]

## 2022-08-21 NOTE — Progress Notes (Signed)
  Progress Note Patient: Crystal Bass IWL:[id number] DOB: October 23, 1941 DOA: 08/19/2022  DOS: the patient was seen and examined on 08/21/2022  Brief hospital course: PMH of Parkinson's disease, severe mitral regurgitation SP MitraClip with chronic HF PEF, HTN, COPD, asthma present to the hospital with complaints of a fall.  Etiology of the fall is not clear.  She was walking and lost her balance and fell on the left side. Appears to have acute displaced left femoral neck fracture.  Orthopedic consulted.  Scheduled for bipolar hemiarthroplasty 9/26. Also has acute on chronic HFpEF.  Cardiology consult for preop clearance. Assessment and Plan: Closed left hip fracture ([place]) Presents after a fall. Etiology of the fall is not clear. Found to have left hip fracture. Orthopedic consult. Scheduled for surgery later today. Currently no prohibitive risk but moderate risk.  [place] cardiology consultation. Pain management and DVT prophylaxis per orthopedics.  Acute on chronic diastolic CHF (congestive heart failure) (HCC) HTN. History of mitral valve repair with MitraClip CAD Mild hypoxia at the time of admission. Bilateral basal crackles. Chest x-ray shows vascular congestion. Treated with IV Lasix. Cardiology consult for preop clearance. Monitor for postop volume overload.  Chronic obstructive airway disease with asthma ([place]) No evidence of Exacerbation for now. Monitor.   Parkinson's disease. Continue Sinemet.  Could be responsible for patient's fall.   Hypokalemia. We will replace. Monitor.   Acute kidney injury. Baseline serum creatinine 0.79. On admission serum creatinine 1.21. Likely cardiorenal hemodynamics as it is improving with IV diuresis. Monitor.   Mild thrombocytopenia. For now we will monitor. No evidence of bleeding.   Anxiety Continue Klonopin. Continue gabapentin.  Moderate protein calorie malnutrition. Body mass index is 24.35 kg/m. Nutrition  Problem: Moderate Malnutrition Etiology: chronic illness (Parkinsons, CHF, COPD) Nutrition Interventions: Interventions: Boost Breeze, MVI, Prostat   Subjective: Continues to have pain.  No nausea no vomiting or no fever no chills.  No other acute events overnight.  Physical Exam: Vitals:   08/21/22 1645 08/21/22 1700 08/21/22 1715 08/21/22 1730  BP: 127/83 124/75 120/72 110/66  Pulse: 72 69 69 70  Resp: 16 (!) '21 20 20  '$ Temp: (!) 97.5 F (36.4 C)  97.7 F (36.5 C)   TempSrc:      SpO2: 100% 100% 96% 96%  Weight:      Height:       General: Appear in mild distress; no visible Abnormal Neck Mass Or lumps, Conjunctiva normal Cardiovascular: S1 and S2 Present, aortic systolic  Murmur, Respiratory: good respiratory effort, Bilateral Air entry present and bilateral Crackles, no wheezes Abdomen: Bowel Sound present, Non tender  Extremities: trace Pedal edema Neurology: alert and oriented to place and person  Gait not checked due to patient safety concerns   Data Reviewed: I have Reviewed nursing notes, Vitals, and Lab results since pt's last encounter. Pertinent lab results CBC and BMP and magnesium I have ordered test including CBC and BMP and magnesium    Family Communication: Family at bedside  Disposition: Status is: Inpatient Remains inpatient appropriate because: Need to monitor for postop recovery most likely will need SNF.  Author: Berle Mull, MD 08/21/2022 5:42 PM  Please look on [email or web address] to find out who is on call.

## 2022-08-21 NOTE — Anesthesia Preprocedure Evaluation (Signed)
Anesthesia Evaluation  Patient identified by MRN, date of birth, ID band Patient awake    Reviewed: Allergy & Precautions, NPO status , Patient's Chart, lab work & pertinent test results  History of Anesthesia Complications Negative for: history of anesthetic complications  Airway Mallampati: III  TM Distance: >3 FB Neck ROM: Limited    Dental  (+) Poor Dentition, Chipped   Pulmonary shortness of breath and with exertion, asthma , neg sleep apnea, COPD,  COPD inhaler, Patient abstained from smoking.Not current smoker,     + decreased breath sounds      Cardiovascular Exercise Tolerance: Poor METS: < 3 Mets hypertension, pulmonary hypertension+CHF  (-) CAD and (-) Past MI (-) dysrhythmias + Valvular Problems/Murmurs MR  Rhythm:Regular Rate:Normal + Systolic murmurs Per cardiology, nothing else to do to optimize this patient : "She has known history of severe symptomatic mitral regurgitation, Parkinson's disease, history of pulmonary embolism and orthostatic hypotension.  She had a right and left cardiac catheterization in March of this year which showed no significant coronary artery disease.  She subsequently underwent mitral valve clip in March of this year but unfortunately it did not work and the patient was left with moderate to severe mitral regurgitation.  Subsequent TEE showed severe MR.  Extensive discussion with the patient about trying to place a third clip but that was risky due to possible worsening of mitral stenosis in the patient opted not to."  TTE 07/2022: 1. Left ventricular ejection fraction, by estimation, is 60 to 65%. The  left ventricle has normal function. The left ventricle has no regional  wall motion abnormalities. There is mild left ventricular hypertrophy.  Left ventricular diastolic parameters  are consistent with Grade III diastolic dysfunction (restrictive).  2. Right ventricular systolic function is  normal. The right ventricular  size is normal. There is moderately elevated pulmonary artery systolic  pressure.  3. Left atrial size was severely dilated.  4. The mitral valve is normal in structure. Moderate to severe mitral  valve regurgitation. Moderate mitral stenosis. s/p mitral valve clip.  5. The aortic valve is normal in structure. Aortic valve regurgitation is  not visualized. No aortic stenosis is present.  6. The inferior vena cava is normal in size with greater than 50%  respiratory variability, suggesting right atrial pressure of 3 mmHg.    Neuro/Psych PSYCHIATRIC DISORDERS Anxiety negative neurological ROS     GI/Hepatic GERD  Medicated and Controlled,(+)     (-) substance abuse  ,   Endo/Other  neg diabetes  Renal/GU negative Renal ROS     Musculoskeletal  (+) Arthritis ,   Abdominal   Peds  Hematology   Anesthesia Other Findings Past Medical History: No date: Aneurysm of aorta (HCC)     Comment:  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 No date: Anginal pain ([place]) No date: Aortic root aneurysm ([place]) No date: Arthritis No date: Asthma 2002: Breast cancer ([place])     Comment:  infiltrative ductal carcinoma  2019: Cataract 07/2007: COPD (chronic obstructive pulmonary disease) ([place]) 09/16/2012: Ductal carcinoma of breast, estrogen receptor positive,  stage 1 (HCC) No date: Dyspnea No date: Endometriosis No date: GERD (gastroesophageal reflux disease) 09/14/2013: Hypercalcemia No date: Hypertension 1992: Lesion of breast     Comment:  right, benign No date: Lung disease     Comment:  secondary to [name] infection 05/1994: Mastalgia 03/14/2012: Osteoporosis, post-menopausal 2004: Ovarian tumor of borderline malignancy, right No date: Pancreatitis     Comment:  secondary  to Cholelithiasis 02/10/2015: Parkinson disease ([place]) No date: Post-thoracotomy pain syndrome 03/2008: Pseudomonas pneumonia ([place]) 02/15/2022: Status post implantation of mitral  valve leaflet clip     Comment:  s/p XTW MitraClip x2 by Dr. Burt Knack in the A2/P2 position 06/2021: Traumatic closed fracture of distal clavicle with minimal  displacement, left, initial encounter     Comment:  [facility] No date: Uterine fibroid No date: Vitamin D deficiency  Reproductive/Obstetrics                             Anesthesia Physical Anesthesia Plan  ASA: 4  Anesthesia Plan: General   Post-op Pain Management: Ofirmev IV (intra-op)*   Induction: Intravenous  PONV Risk Score and Plan: 3 and Ondansetron, Dexamethasone, Midazolam and Treatment may vary due to age or medical condition  Airway Management Planned: Oral ETT  Additional Equipment: Arterial line  Intra-op Plan:   Post-operative Plan: Extubation in OR and Possible Post-op intubation/ventilation  Informed Consent: I have reviewed the patients History and Physical, chart, labs and discussed the procedure including the risks, benefits and alternatives for the proposed anesthesia with the patient or authorized representative who has indicated his/her understanding and acceptance.   Patient has DNR.  Discussed DNR with patient and Continue DNR.   Dental advisory given  Plan Discussed with: CRNA and Surgeon  Anesthesia Plan Comments: (Discussed risks of anesthesia with patient (brother and niece at bedside), including PONV, sore throat, lip/dental/eye damage. Rare risks discussed as well, such as cardiorespiratory and neurological sequelae, and allergic reactions. Discussed the role of CRNA in patient's perioperative care. Patient understands. Patient counseled on being higher risk for anesthesia due to comorbidities: severe, improperly correct MR; HFpEF; COPD. Patient was told about increased risk of cardiac and respiratory events, including death. Discussed in depth patient's DNR. Patient understands, and will allow all methods of resuscitation EXCLUDING chest compressions.)         Anesthesia Quick Evaluation

## 2022-08-21 NOTE — Plan of Care (Signed)
  Problem: Education: Goal: Knowledge of General Education information will improve Description: Including pain rating scale, medication(s)/side effects and non-pharmacologic comfort measures Outcome: Progressing   Problem: Health Behavior/Discharge Planning: Goal: Ability to manage health-related needs will improve Outcome: Progressing   Problem: Clinical Measurements: Goal: Cardiovascular complication will be avoided Outcome: Progressing   Problem: Activity: Goal: Risk for activity intolerance will decrease Outcome: Not Progressing   Problem: Coping: Goal: Level of anxiety will decrease Outcome: Progressing   Problem: Elimination: Goal: Will not experience complications related to urinary retention Outcome: Progressing   Problem: Pain Managment: Goal: General experience of comfort will improve Outcome: Not Progressing   Problem: Safety: Goal: Ability to remain free from injury will improve Outcome: Progressing

## 2022-08-21 NOTE — Transfer of Care (Signed)
Immediate Anesthesia Transfer of Care Note  Patient: Crystal Bass  Procedure(s) Performed: ARTHROPLASTY BIPOLAR HIP (HEMIARTHROPLASTY) (Left: Hip)  Patient Location: PACU  Anesthesia Type:General  Level of Consciousness: awake and sedated  Airway & Oxygen Therapy: Patient Spontanous Breathing and Patient connected to face mask oxygen  Post-op Assessment: Report given to RN and Post -op Vital signs reviewed and stable  Post vital signs: Reviewed and stable  Last Vitals:  Vitals Value Taken Time  BP 110/66 08/21/22 1730  Temp 36.5 C 08/21/22 1715  Pulse 68 08/21/22 1735  Resp 22 08/21/22 1735  SpO2 96 % 08/21/22 1735  Vitals shown include unvalidated device data.  Last Pain:  Vitals:   08/21/22 1715  TempSrc:   PainSc: 0-No pain      Patients Stated Pain Goal: 0 (54/56/25 6389)  Complications:  Encounter Notable Events  Notable Event Outcome Phase Comment  Difficult to intubate - unexpected  Intraprocedure Filed from anesthesia note documentation.

## 2022-08-21 NOTE — Anesthesia Postprocedure Evaluation (Signed)
Anesthesia Post Note  Patient: [name]  Procedure(s) Performed: ARTHROPLASTY BIPOLAR HIP (HEMIARTHROPLASTY) (Left: Hip)  Patient location during evaluation: PACU Anesthesia Type: General and Regional Level of consciousness: awake and alert Pain management: pain level controlled Vital Signs Assessment: post-procedure vital signs reviewed and stable Respiratory status: spontaneous breathing, nonlabored ventilation, respiratory function stable and patient connected to nasal cannula oxygen Cardiovascular status: blood pressure returned to baseline and stable Postop Assessment: no apparent nausea or vomiting Anesthetic complications: yes   Encounter Notable Events  Notable Event Outcome Phase Comment  Difficult to intubate - unexpected  Intraprocedure Filed from anesthesia note documentation.     Last Vitals:  Vitals:   08/21/22 1715 08/21/22 1730  BP: 120/72 110/66  Pulse: 69 70  Resp: 20 20  Temp: 36.5 C   SpO2: 96% 96%    Last Pain:  Vitals:   08/21/22 1715  TempSrc:   PainSc: 0-No pain                 Ilene Qua

## 2022-08-22 ENCOUNTER — Encounter: Payer: Self-pay | Admitting: Orthopedic Surgery

## 2022-08-22 DIAGNOSIS — Z9889 Other specified postprocedural states: Secondary | ICD-10-CM

## 2022-08-22 DIAGNOSIS — S72002A Fracture of unspecified part of neck of left femur, initial encounter for closed fracture: Secondary | ICD-10-CM | POA: Diagnosis not present

## 2022-08-22 DIAGNOSIS — R1314 Dysphagia, pharyngoesophageal phase: Secondary | ICD-10-CM

## 2022-08-22 DIAGNOSIS — G20 Parkinson's disease: Secondary | ICD-10-CM

## 2022-08-22 DIAGNOSIS — E440 Moderate protein-calorie malnutrition: Secondary | ICD-10-CM

## 2022-08-22 DIAGNOSIS — I5033 Acute on chronic diastolic (congestive) heart failure: Secondary | ICD-10-CM | POA: Diagnosis not present

## 2022-08-22 DIAGNOSIS — I340 Nonrheumatic mitral (valve) insufficiency: Secondary | ICD-10-CM

## 2022-08-22 DIAGNOSIS — N179 Acute kidney failure, unspecified: Secondary | ICD-10-CM

## 2022-08-22 DIAGNOSIS — E538 Deficiency of other specified B group vitamins: Secondary | ICD-10-CM

## 2022-08-22 DIAGNOSIS — F419 Anxiety disorder, unspecified: Secondary | ICD-10-CM | POA: Diagnosis not present

## 2022-08-22 DIAGNOSIS — Z95818 Presence of other cardiac implants and grafts: Secondary | ICD-10-CM

## 2022-08-22 LAB — BASIC METABOLIC PANEL
Anion gap: 4 — ABNORMAL LOW (ref 5–15)
BUN: 28 mg/dL — ABNORMAL HIGH (ref 8–23)
CO2: 26 mmol/L (ref 22–32)
Calcium: 8.2 mg/dL — ABNORMAL LOW (ref 8.9–10.3)
Chloride: 106 mmol/L (ref 98–111)
Creatinine, Ser: 0.8 mg/dL (ref 0.44–1.00)
GFR, Estimated: >60 mL/min (ref >60)
Glucose, Bld: 133 mg/dL — ABNORMAL HIGH (ref 70–99)
Potassium: 4.5 mmol/L (ref 3.5–5.1)
Sodium: 136 mmol/L (ref 135–145)

## 2022-08-22 LAB — CBC
HCT: 30.1 % — ABNORMAL LOW (ref 36.0–46.0)
Hemoglobin: 9.3 g/dL — ABNORMAL LOW (ref 12.0–15.0)
MCH: 29.6 pg (ref 26.0–34.0)
MCHC: 30.9 g/dL (ref 30.0–36.0)
MCV: 95.9 fL (ref 80.0–100.0)
Platelets: 108 10*3/uL — ABNORMAL LOW (ref 150–400)
RBC: 3.14 MIL/uL — ABNORMAL LOW (ref 3.87–5.11)
RDW: 14.9 % (ref 11.5–15.5)
WBC: 10.6 10*3/uL — ABNORMAL HIGH (ref 4.0–10.5)
nRBC: 0 % (ref 0.0–0.2)

## 2022-08-22 MED ORDER — SODIUM CHLORIDE 0.9 % IV BOLUS
250.0000 mL | Freq: Once | INTRAVENOUS | Status: AC
  Administered 2022-08-22: 250 mL via INTRAVENOUS

## 2022-08-22 NOTE — Care Management Important Message (Signed)
Important Message  Patient Details  Name: Crystal Bass MRN: [id number] Date of Birth: 1941/01/19   Medicare Important Message Given:  Yes     Dannette Barbara 08/22/2022, 11:24 AM

## 2022-08-22 NOTE — Evaluation (Signed)
Occupational Therapy Evaluation Patient Details Name: ELDONNA NEUENFELDT MRN: [id number] DOB: Aug 20, 1941 Today's Date: 08/22/2022   History of Present Illness Pt is 16 YOF admitted for closed L hip fx, now s/p L hip hemiarthroplasty. PMH includes: PD, mitral regurgitation, HFpEF, HTN, COPD, asthma.   Clinical Impression   Pt seen for OT evaluation this date, POD#1 from above surgery. Pt was independent in all ADLs prior to surgery, ambulating without AD, driving, shopping. She lives in [place] of [place]. During today's evaluation, pt is limited by pain. She endorses mild pain in supine but reports pain shoots up to severe with slightest movement of L LE. Pt instructed in posterior total hip precautions, self care skills, falls prevention strategies, and home/routines modifications. Provided educ re: importance of OOB movement, role of OT, DC recs. Pt's Sp02 at 99% on 2L O2, so adjusted to 1L O2 (pt does not use O2 at baseline), with SpO2 remaining at 95-96%. Nurse notified of O2 adjustment and of pt's pain level. Pt requires Max A for supine<>sit, declines to attempt standing, citing pain. Will continue to offer OT during hospitalization, and recommend DC to SNF at [facility].    Recommendations for follow up therapy are one component of a multi-disciplinary discharge planning process, led by the attending physician.  Recommendations may be updated based on patient status, additional functional criteria and insurance authorization.   Follow Up Recommendations  Skilled nursing-short term rehab (<3 hours/day)    Assistance Recommended at Discharge Frequent or constant Supervision/Assistance  Patient can return home with the following A lot of help with walking and/or transfers;A lot of help with bathing/dressing/bathroom;Assistance with cooking/housework;Help with stairs or ramp for entrance    Functional Status Assessment  Patient has had a recent decline in their functional  status and demonstrates the ability to make significant improvements in function in a reasonable and predictable amount of time.  Equipment Recommendations  Other (comment) (RW)    Recommendations for Other Services       Precautions / Restrictions Precautions Precautions: Posterior Hip;Fall Precaution Booklet Issued: Yes (comment) Restrictions Weight Bearing Restrictions: Yes LLE Weight Bearing: Weight bearing as tolerated      Mobility Bed Mobility Overal bed mobility: Needs Assistance Bed Mobility: Supine to Sit, Sit to Supine     Supine to sit: Max assist Sit to supine: Max assist   General bed mobility comments: Pt endorses severe pain with supine<>sit attempt    Transfers Overall transfer level: Needs assistance                 General transfer comment: Not attempted, pt declines, 2/2 pain      Balance Overall balance assessment: Needs assistance Sitting-balance support: Bilateral upper extremity supported, Feet supported Sitting balance-Leahy Scale: Fair   Postural control: Right lateral lean                                 ADL either performed or assessed with clinical judgement   ADL                                               Vision         Perception     Praxis      Pertinent Vitals/Pain Pain Assessment Pain Score: 7  Pain Location: L hip, w/ movement Pain Descriptors / Indicators: Aching, Guarding, Grimacing Pain Intervention(s): Limited activity within patient's tolerance, Repositioned, Monitored during session, Ice applied, Patient requesting pain meds-RN notified     Hand Dominance     Extremity/Trunk Assessment Upper Extremity Assessment Upper Extremity Assessment: Generalized weakness   Lower Extremity Assessment Lower Extremity Assessment: LLE deficits/detail;Generalized weakness LLE Deficits / Details: impaired d/t surgical status LLE: Unable to fully assess due to pain LLE  Sensation: WNL       Communication Communication Communication: No difficulties   Cognition Arousal/Alertness: Awake/alert Behavior During Therapy: WFL for tasks assessed/performed Overall Cognitive Status: Within Functional Limits for tasks assessed                                       General Comments       Exercises Other Exercises Other Exercises: educ re: posterior hip precautions, falls prevention, importance of OOB mobility   Shoulder Instructions      Home Living Family/patient expects to be discharged to:: Private residence Living Arrangements: Alone Available Help at Discharge: Family;Friend(s);Home health Type of Home: House Home Access: Level entry     Home Layout: One level     Bathroom Shower/Tub: Occupational psychologist: Handicapped height     Home Equipment: Rollator (4 wheels);Cane - single point;Shower seat;Wheelchair - manual   Additional Comments: Pt lives in [place]      Prior Functioning/Environment Prior Level of Function : Independent/Modified Independent             Mobility Comments: facility distances w no AD ADLs Comments: I w ADLs/IADLs        OT Problem List: Decreased range of motion;Decreased activity tolerance;Decreased strength;Impaired balance (sitting and/or standing);Pain;Decreased knowledge of use of DME or AE      OT Treatment/Interventions: Self-care/ADL training;Therapeutic exercise;Patient/family education;Balance training;Energy conservation;Therapeutic activities;DME and/or AE instruction    OT Goals(Current goals can be found in the care plan section) Acute Rehab OT Goals Patient Stated Goal: to get back home OT Goal Formulation: With patient Time For Goal Achievement: 09/05/22 Potential to Achieve Goals: Good  OT Frequency: Min 2X/week    Co-evaluation              AM-PAC OT "6 Clicks" Daily Activity     Outcome Measure Help from another person  eating meals?: None Help from another person taking care of personal grooming?: A Little Help from another person toileting, which includes using toliet, bedpan, or urinal?: A Lot Help from another person bathing (including washing, rinsing, drying)?: A Lot Help from another person to put on and taking off regular upper body clothing?: A Little Help from another person to put on and taking off regular lower body clothing?: A Lot 6 Click Score: 16   End of Session Nurse Communication: Mobility status;Other (comment) (pt's pain level; change to O2)  Activity Tolerance: Patient limited by pain Patient left: in bed;with call bell/phone within reach;with bed alarm set  OT Visit Diagnosis: Unsteadiness on feet (R26.81);Muscle weakness (generalized) (M62.81);Pain Pain - Right/Left: Left Pain - part of body: Hip                Time: 6754-4920 OT Time Calculation (min): 37 min Charges:  OT General Charges $OT Visit: 1 Visit OT Evaluation $OT Eval Low Complexity: 1 Low OT Treatments $Self Care/Home Management :  23-37 mins Josiah Lobo, PhD, MS, OTR/L 08/22/22, 4:43 PM

## 2022-08-22 NOTE — NC FL2 (Signed)
[place] LEVEL OF CARE SCREENING TOOL     IDENTIFICATION  Patient Name: Crystal Bass Birthdate: 1941/06/21 Sex: adult Admission Date (Current Location): 08/19/2022  [facility] and [place] Number:  Engineering geologist and Address:  [facility], [address], [place], [place] [address]      Provider Number: [id number]  Attending Physician Name and Address:  Patrecia Pour, MD  Relative Name and Phone Number:  [name] [phone]    Current Level of Care: Hospital Recommended Level of Care: [place] Prior Approval Number:    Date Approved/Denied:   PASRR Number: [id number]  Discharge Plan: SNF    Current Diagnoses: Patient Active Problem List   Diagnosis Date Noted   AKI (acute kidney injury) ([place]) 08/21/2022   Acute on chronic diastolic CHF (congestive heart failure) ([place]) 08/20/2022   Anxiety 08/20/2022   Malnutrition of moderate degree 08/20/2022   Closed left hip fracture ([place]) 08/19/2022   Allergic rhinitis due to animal (cat) (dog) hair and dander 08/13/2022   Allergic rhinitis due to pollen 08/13/2022   Mild persistent asthma, uncomplicated 84/69/6295   Pain in right hand 07/17/2022   Severe mitral regurgitation 02/15/2022   S/P mitral valve clip implantation 02/15/2022   Pre-operative respiratory examination 02/09/2022   Weight loss, unintentional 02/05/2022   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   Traumatic closed fracture of distal clavicle with minimal displacement, left, initial encounter 08/09/2021   Closed fracture of left clavicle 07/17/2021   Ovarian tumor of borderline malignancy, right 10/28/2020   Nephrolithiasis 08/20/2020   Atherosclerosis of aorta ([place]) 08/20/2020   Angioedema 05/04/2020   Mitral valve disease 04/08/2020   Medicare annual wellness visit, subsequent 01/27/2020   REM sleep behavior disorder 01/27/2020   Chronic neuropathic pain 01/27/2020   Acute midline low back pain with  right-sided sciatica 10/26/2019   Orthostatic syncope 10/26/2019   [name] (mycobacterium avium-intracellulare) infection ([place]) 11/01/2018   History of pulmonary embolism 10/31/2018   Chest pain 10/26/2018   Orthostatic hypotension 08/25/2018   Renal cyst, acquired, right 02/02/2018   Advanced care planning/counseling discussion 01/26/2018   Liver lesion 01/21/2018   Vitamin B12 deficiency 01/21/2018   Chronic fatigue 10/23/2017   Genetic testing 05/01/2017   Health maintenance examination 12/27/2016   Parkinson disease ([place]) 01/10/2016   DNR (do not resuscitate) 05/22/2015   Tremor 02/07/2015   Dysphagia, pharyngoesophageal phase 02/07/2015   Hypercalcemia 09/14/2013   Vitamin D deficiency 04/27/2013   Osteoporosis, post-menopausal 03/14/2012   Chronic dyspnea 03/28/2011   Thoracic aortic aneurysm without rupture 01/01/2011   Atypical pneumonia 09/27/2009   OSTEOARTHRITIS, CERVICAL SPINE 08/15/2009   ALLERGIC RHINITIS 03/25/2008   Chronic obstructive airway disease with asthma ([place]) 03/25/2008   History of left breast cancer 03/10/2008   Essential hypertension 02/10/2007    Orientation RESPIRATION BLADDER Height & Weight     Self, Time, Situation, Place  Normal, O2 (2 liters) Continent, External catheter Weight: 60.4 kg Height:  '5\' 2"'$  (157.5 cm)  BEHAVIORAL SYMPTOMS/MOOD NEUROLOGICAL BOWEL NUTRITION STATUS      Continent Diet (see dc summary)  AMBULATORY STATUS COMMUNICATION OF NEEDS Skin   Extensive Assist Verbally Normal, Surgical wounds                       Personal Care Assistance Level of Assistance  Bathing, Feeding, Dressing Bathing Assistance: Maximum assistance Feeding assistance: Limited assistance Dressing Assistance: Maximum assistance     Functional Limitations Info  Hearing, Sight  Sight Info: Impaired (glasses) Hearing Info: Adequate      SPECIAL CARE FACTORS FREQUENCY  PT (By licensed PT), OT (By licensed OT)     PT Frequency: 5 times per  week OT Frequency: 5 times per week            Contractures Contractures Info: Not present    Additional Factors Info  Code Status, Allergies Code Status Info: DNR Allergies Info: Sulfa Antibiotics, Sulfonamide Derivatives, Topamax (Topiramate), Clarithromycin, Hydrocodone, Motrin (Ibuprofen), Misc. Sulfonamide Containing Compounds, Symbicort (Budesonide-formoterol Fumarate), Percocet (Oxycodone-acetaminophen), Tape, Tetracyclines & Related           Current Medications (08/22/2022):  This is the current hospital active medication list Current Facility-Administered Medications  Medication Dose Route Frequency Provider Last Rate Last Admin   (feeding supplement) PROSource Plus liquid 30 mL  30 mL Oral BID BM [place], MD   30 mL at 08/22/22 1017   0.9 %  sodium chloride infusion   Intravenous Continuous [place], MD 75 mL/hr at 08/22/22 0600 New Bag at 08/22/22 0600   acetaminophen (TYLENOL) tablet 1,000 mg  1,000 mg Oral Q6H [place], MD   1,000 mg at 08/22/22 1211   acetaminophen (TYLENOL) tablet 650 mg  650 mg Oral Q4H PRN [place], MD       albuterol (PROVENTIL) (2.5 MG/3ML) 0.083% nebulizer solution 2.5 mg  2.5 mg Nebulization Q6H PRN [place], MD       alum & mag hydroxide-simeth (MAALOX/MYLANTA) 200-200-20 MG/5ML suspension 30 mL  30 mL Oral Q4H PRN [place], MD       bisacodyl (DULCOLAX) suppository 10 mg  10 mg Rectal Daily PRN [place], MD       Carbidopa-Levodopa ER (SINEMET CR) 25-100 MG tablet controlled release 2 tablet  2 tablet Oral Daily [place], MD   2 tablet at 08/22/22 2992   And   Carbidopa-Levodopa ER (SINEMET CR) 25-100 MG tablet controlled release 1 tablet  1 tablet Oral BID [place] Park, MD   1 tablet at 08/22/22 1212   chlorhexidine (PERIDEX) 0.12 % solution 15 mL  15 mL Mouth/Throat BID [place], MD   15 mL at 08/22/22 1015   clonazePAM (KLONOPIN) tablet 0.5 mg  0.5 mg Oral QHS  [place], MD   0.5 mg at 08/21/22 2206   docusate sodium (COLACE) capsule 100 mg  100 mg Oral BID [place], MD   100 mg at 08/22/22 1015   enoxaparin (LOVENOX) injection 40 mg  40 mg Subcutaneous Q24H [place], MD   40 mg at 08/22/22 0817   feeding supplement (BOOST / RESOURCE BREEZE) liquid 1 Container  1 Container Oral TID BM [place] Park, MD   1 Container at 08/22/22 1017   ferrous sulfate tablet 325 mg  325 mg Oral TID PC [place], MD   325 mg at 08/22/22 1212   fluticasone furoate-vilanterol (BREO ELLIPTA) 100-25 MCG/ACT 1 puff  1 puff Inhalation Daily [place], MD   1 puff at 08/22/22 0818   And   umeclidinium bromide (INCRUSE ELLIPTA) 62.5 MCG/ACT 1 puff  1 puff Inhalation Daily [place], MD   1 puff at 08/22/22 0818   gabapentin ([name]) tablet 600 mg  600 mg Oral TID [place], MD   600 mg at 08/22/22 1015   HYDROmorphone (DILAUDID) tablet 1-2 mg  1-2 mg Oral Q4H PRN [place], MD       menthol-cetylpyridinium (CEPACOL) lozenge 3 mg  1 lozenge Oral  PRN [place], MD       Or   phenol (CHLORASEPTIC) mouth spray 1 spray  1 spray Mouth/Throat PRN [place], MD       methocarbamol (ROBAXIN) tablet 500 mg  500 mg Oral Q6H PRN [place], MD   500 mg at 08/22/22 1015   Or   methocarbamol (ROBAXIN) 500 mg in dextrose 5 % 50 mL IVPB  500 mg Intravenous Q6H PRN [place], MD       morphine (MSIR) tablet 30 mg  30 mg Oral Q4H PRN [place], MD       ondansetron [facility]) tablet 4 mg  4 mg Oral Q6H PRN [place], MD       Or   ondansetron [facility]) injection 4 mg  4 mg Intravenous Q6H PRN [place], MD       polyethylene glycol (MIRALAX / GLYCOLAX) packet 17 g  17 g Oral Daily PRN [place], MD   17 g at 08/22/22 0557   senna (SENOKOT) tablet 8.6 mg  1 tablet Oral BID [place], MD   8.6 mg at 08/22/22 1015   traMADol (ULTRAM) tablet 50 mg  50 mg Oral Q6H [place] Park, MD   50 mg at 08/22/22 1212   traZODone (DESYREL) tablet 25 mg  25 mg Oral QHS PRN [place], MD         Discharge Medications: Please see discharge summary for a list of discharge medications.  Relevant Imaging Results:  Relevant Lab Results:   Additional Information [id number] [place], RN

## 2022-08-22 NOTE — Progress Notes (Signed)
Progress Note  Patient: Crystal Bass GBT:[id number] DOB: 02/10/1941  DOA: 08/19/2022  DOS: 08/22/2022    Brief hospital course: PMH of Parkinson's disease, severe mitral regurgitation SP MitraClip with chronic HF PEF, HTN, COPD, asthma present to the hospital with complaints of a fall.  Etiology of the fall is not clear.  She was walking and lost her balance and fell on the left side. Appears to have acute displaced left femoral neck fracture.  Orthopedic consulted.  Scheduled for bipolar hemiarthroplasty 9/26. Also has acute on chronic HFpEF.  Cardiology consult for preop clearance.  Assessment and Plan: Closed left hip fracture: Due to fall. s/p IM nail 9/26 by Dr. Mack Guise - WBAT, posterior hip precautions.  - Wound care per orthopedics - Continue pain control per orthopedics.    Acute on chronic HFpEF: Echo with LVEF 60-65%, G3DD with restrictive physiology, severe LAE, mod-severe MR, mod MS s/p mitral valve clip. RV normal.  - Received diuresis initially, postoperative hypotension has limited ongoing diuresis. Small bolus given. Now taking po, doesn't appear terribly overloaded. If unable to wean oxygen, will repeat CXR and consider repeat lasix.   Acute hypoxic respiratory failure:  - Add incentive spirometry - Diuresed as above, limited currently.   Decreased UOP: Creatinine is stable, UOP picking up now.  - Monitor UOP and if stable creatinine, perform voiding trial in AM.   Acute blood loss anemia due to hip fracture and surgery:  - Monitor again in AM to confirm stability. s/p T&S.   Thrombocytopenia:  - Monitor in AM - Lovenox for VTE ppx per orthopedics started today, will monitor for bleeding/worsening of low platelets.    Parkinson's disease: Bradykinetic/bradyphrenic. Increases risk of continued falls.  - Continue sinemet, PT/OT.   COPD, asthma: Quiescent.  - Continue breo/incruse and prn BD.   Hypokalemia: Resolved.   AKI: Resolved with diuresis, recheck  in AM.    Anxiety - Continue clonazepam, gabapentin.   Moderate protein calorie malnutrition: - Boost Breeze, MVI, Prostat   Subjective: No incentive spirometry given this admission, but she's familiar with it and willing to use it. Pain in left hip is moderate, improved with meds and ice. No chest pain or dyspnea, but still has oxygen on.   Objective: Vitals:   08/22/22 0208 08/22/22 0340 08/22/22 0555 08/22/22 0902  BP: 107/65 (!) 96/58 102/64 (!) 98/57  Pulse: 65 64 64 66  Resp: '17 14 16 18  '$ Temp: (!) 97.5 F (36.4 C) 97.6 F (36.4 C) 98 F (36.7 C) 98 F (36.7 C)  TempSrc:      SpO2: 98% 98% 100% 100%  Weight:      Height:       Gen: Elderly frail adult in no distress Pulm: Nonlabored breathing 2L O2, crackles at bases clearing with repeated inspirations. No wheezes. CV: RRR, holosystolic crescendo-decrescendo III/VI murmur at apex, slight diastolic rumble noted. No rub, or gallop. No JVD, trace pitting dependent edema. GI: Abdomen soft, non-tender, non-distended, with normoactive bowel sounds.  Ext: Warm, no deformities Skin: No rashes, lesions or ulcers on visualized skin. Left lateral hip dressing with dried blood, no significant tenderness to palpation or surrounding erythema/ecchymosis.  Neuro: Alert and oriented. Slow to respond, but responds appropriately, cooperative without focal neurological deficits. Psych: Judgement and insight appear marginal. Mood euthymic & affect congruent. Behavior is appropriate.    Data Personally reviewed: CBC: Recent Labs  Lab 08/19/22 2106 08/20/22 0555 08/21/22 0755 08/21/22 2254 08/22/22 0516  WBC 6.5 7.6 7.9  --  10.6*  NEUTROABS 5.0  --   --   --   --   HGB 12.0 11.7* 11.5* 9.9* 9.3*  HCT 37.9 36.0 37.1 31.5* 30.1*  MCV 94.3 92.5 95.1  --  95.9  PLT 151 146* 123*  --  716*   Basic Metabolic Panel: Recent Labs  Lab 08/19/22 2106 08/20/22 0555 08/21/22 0755 08/22/22 0516  NA 141 141 138 136  K 3.6 3.4* 4.8 4.5   CL 103 103 104 106  CO2 '30 30 28 26  '$ GLUCOSE 118* 106* 107* 133*  BUN '22 21 21 '$ 28*  CREATININE 1.21* 0.80 0.77 0.80  CALCIUM 8.8* 8.5* 9.1 8.2*  MG  --   --  2.3  --    GFR: Estimated Creatinine Clearance (by C-G formula based on SCr of 0.8 mg/dL) Female: 48.8 mL/min Female: 57.8 mL/min Liver Function Tests: No results for input(s): "AST", "ALT", "ALKPHOS", "BILITOT", "PROT", "ALBUMIN" in the last 168 hours. No results for input(s): "LIPASE", "AMYLASE" in the last 168 hours. No results for input(s): "AMMONIA" in the last 168 hours. Coagulation Profile: No results for input(s): "INR", "PROTIME" in the last 168 hours. Cardiac Enzymes: No results for input(s): "CKTOTAL", "CKMB", "CKMBINDEX", "TROPONINI" in the last 168 hours. BNP (last 3 results) Recent Labs    04/30/22 1249  PROBNP 403   HbA1C: No results for input(s): "HGBA1C" in the last 72 hours. CBG: No results for input(s): "GLUCAP" in the last 168 hours. Lipid Profile: No results for input(s): "CHOL", "HDL", "LDLCALC", "TRIG", "CHOLHDL", "LDLDIRECT" in the last 72 hours. Thyroid Function Tests: No results for input(s): "TSH", "T4TOTAL", "FREET4", "T3FREE", "THYROIDAB" in the last 72 hours. Anemia Panel: No results for input(s): "VITAMINB12", "FOLATE", "FERRITIN", "TIBC", "IRON", "RETICCTPCT" in the last 72 hours. Urine analysis:    Component Value Date/Time   COLORURINE STRAW (A) 06/15/2022 1047   APPEARANCEUR CLEAR 06/15/2022 1047   LABSPEC 1.008 06/15/2022 1047   PHURINE 6.0 06/15/2022 1047   GLUCOSEU NEGATIVE 06/15/2022 1047   HGBUR NEGATIVE 06/15/2022 [place] 06/15/2022 1047   BILIRUBINUR n 03/25/2020 1408   KETONESUR NEGATIVE 06/15/2022 1047   PROTEINUR NEGATIVE 06/15/2022 1047   UROBILINOGEN negative (A) 03/25/2020 1408   NITRITE NEGATIVE 06/15/2022 1047   LEUKOCYTESUR TRACE (A) 06/15/2022 1047   No results found for this or any previous visit (from the past 240 hour(s)).   DG Hip  Port Unilat With Pelvis 1V Left  Result Date: 08/21/2022 CLINICAL DATA:  Post LEFT tip hemiarthroplasty EXAM: DG HIP (WITH OR WITHOUT PELVIS) 1V PORT LEFT COMPARISON:  Portable exam 1725 hours compared to 08/19/2022 FINDINGS: Interval resection of LEFT femoral head/neck and placement of a LEFT hip prosthesis. No acute fracture, dislocation, or bone destruction. IMPRESSION: LEFT femoral prosthesis without acute complication. Electronically Signed   By: Lavonia Dana M.D.   On: 08/21/2022 17:57    Family Communication: None at bedside  Disposition: Status is: Inpatient Remains inpatient appropriate because: Monitoring urine output, hemodynamics, seeking SNF placement Planned Discharge Destination: [place], MD 08/22/2022 5:10 PM Page by [email or web address]

## 2022-08-22 NOTE — Evaluation (Signed)
Physical Therapy Evaluation Patient Details Name: Crystal Bass MRN: [id number] DOB: 06/02/1941 Today's Date: 08/22/2022  History of Present Illness  Pt is 87 YOF admitted for closed L hip fx, now s/p L hip hemiarthroplasty. PMH includes: PD, mitral regurgitation, HRpEF, HTN, COPD, asthma.  Clinical Impression  Pt presents to PT in bed and agreeable to participate in therapy services. Pt requires significant physical assistance for bed mobility and transfers. Pt requires extended time for processing questions and commands. During standing, demonstrates heavy posterior lean and requires physical assist to prevent posterior LOB. Unable to take side steps at EOB despite assistance. Pt appears to be primarily limited by pain at this time. Would benefit from skilled PT at SNF to address above deficits in strength, functional mobility, ROM, balance, and activity tolerance to promote optimal return to PLOF.        Recommendations for follow up therapy are one component of a multi-disciplinary discharge planning process, led by the attending physician.  Recommendations may be updated based on patient status, additional functional criteria and insurance authorization.  Follow Up Recommendations Skilled nursing-short term rehab (<3 hours/day) Can patient physically be transported by private vehicle: No    Assistance Recommended at Discharge Frequent or constant Supervision/Assistance  Patient can return home with the following  A lot of help with walking and/or transfers;A lot of help with bathing/dressing/bathroom;Assist for transportation;Assistance with cooking/housework    Equipment Recommendations Rolling walker (2 wheels);BSC/3in1  Recommendations for Other Services       Functional Status Assessment Patient has had a recent decline in their functional status and demonstrates the ability to make significant improvements in function in a reasonable and predictable amount of time.      Precautions / Restrictions Precautions Precautions: Posterior Hip;Fall Precaution Booklet Issued: Yes (comment) Restrictions Weight Bearing Restrictions: Yes LLE Weight Bearing: Weight bearing as tolerated      Mobility  Bed Mobility Overal bed mobility: Needs Assistance Bed Mobility: Supine to Sit, Sit to Supine     Supine to sit: Max assist, +2 for physical assistance Sit to supine: Max assist, +2 for physical assistance        Transfers Overall transfer level: Needs assistance Equipment used: Rolling walker (2 wheels) Transfers: Sit to/from Stand Sit to Stand: Max assistx2           General transfer comment: heavy posterior lean w STS transfer    Ambulation/Gait               General Gait Details: unable d/t pain  Stairs            Wheelchair Mobility    Modified Rankin (Stroke Patients Only)       Balance Overall balance assessment: Needs assistance Sitting-balance support: Bilateral upper extremity supported, Feet supported Sitting balance-Leahy Scale: Fair   Postural control: Right lateral lean Standing balance support: Bilateral upper extremity supported, During functional activity, Reliant on assistive device for balance Standing balance-Leahy Scale: Poor Standing balance comment: heavy posterior lean during standing                             Pertinent Vitals/Pain Pain Assessment Pain Assessment:  (Pt unable to assign numerical value for NPS scale. Able to report "medium" pain out of high, medium or low.)    Home Living Family/patient expects to be discharged to:: Assisted living Living Arrangements: Alone  Prior Function Prior Level of Function : Independent/Modified Independent             Mobility Comments: facility distances w no AD ADLs Comments: I w ADLs/IADLs     Hand Dominance        Extremity/Trunk Assessment   Upper Extremity Assessment Upper Extremity  Assessment: Generalized weakness    Lower Extremity Assessment Lower Extremity Assessment: LLE deficits/detail LLE Deficits / Details: impaired d/t surgical status LLE: Unable to fully assess due to pain LLE Sensation: WNL LLE Coordination: WNL    Cervical / Trunk Assessment Cervical / Trunk Assessment: Kyphotic  Communication      Cognition Arousal/Alertness: Awake/alert Behavior During Therapy: Flat affect Overall Cognitive Status: Difficult to assess                                 General Comments: oreiented to self and time, not location        General Comments      Exercises Other Exercises Other Exercises: posterior hip precaution edu   Assessment/Plan    PT Assessment Patient needs continued PT services  PT Problem List Decreased strength;Decreased range of motion;Decreased activity tolerance;Decreased balance;Decreased mobility;Decreased knowledge of use of DME;Decreased knowledge of precautions;Pain       PT Treatment Interventions DME instruction;Therapeutic exercise;Gait training;Balance training;Stair training;Neuromuscular re-education;Functional mobility training;Therapeutic activities;Patient/family education    PT Goals (Current goals can be found in the Care Plan section)  Acute Rehab PT Goals Patient Stated Goal: to go grocery shopping PT Goal Formulation: With patient Time For Goal Achievement: 09/05/22    Frequency BID     Co-evaluation               AM-PAC PT "6 Clicks" Mobility  Outcome Measure Help needed turning from your back to your side while in a flat bed without using bedrails?: A Lot Help needed moving from lying on your back to sitting on the side of a flat bed without using bedrails?: A Lot Help needed moving to and from a bed to a chair (including a wheelchair)?: A Lot Help needed standing up from a chair using your arms (e.g., wheelchair or bedside chair)?: A Lot Help needed to walk in hospital room?:  Total Help needed climbing 3-5 steps with a railing? : Total 6 Click Score: 10    End of Session Equipment Utilized During Treatment: Gait belt;Oxygen Activity Tolerance: Patient limited by pain Patient left: in bed;with call bell/phone within reach;with bed alarm set;with SCD's reapplied   PT Visit Diagnosis: Other abnormalities of gait and mobility (R26.89);Muscle weakness (generalized) (M62.81);Pain Pain - Right/Left: Left Pain - part of body: Hip    Time: 0921-1006 PT Time Calculation (min) (ACUTE ONLY): 45 min   Charges:              Glenice Laine MPH, SPT 08/22/22, 1:18 PM

## 2022-08-22 NOTE — Progress Notes (Signed)
Subjective:  POD #1 s/p knee arthroplasty for left femoral neck Fracture.   Patient reports left hip pain as mild to moderate.  She is not having significant pain at rest.  Patient is eating lunch in bed.  Objective:   VITALS:   Vitals:   08/22/22 0208 08/22/22 0340 08/22/22 0555 08/22/22 0902  BP: 107/65 (!) 96/58 102/64 (!) 98/57  Pulse: 65 64 64 66  Resp: '17 14 16 18  '$ Temp: (!) 97.5 F (36.4 C) 97.6 F (36.4 C) 98 F (36.7 C) 98 F (36.7 C)  TempSrc:      SpO2: 98% 98% 100% 100%  Weight:      Height:        PHYSICAL EXAM: Left lower extremity Neurovascular intact Sensation intact distally Intact pulses distally Dorsiflexion/Plantar flexion intact Aquacel dressing: moderate sanguinous drainage No cellulitis present Compartment soft  LABS  Results for orders placed or performed during the hospital encounter of 08/19/22 (from the past 24 hour(s))  Hemoglobin and hematocrit, blood     Status: Abnormal   Collection Time: 08/21/22 10:54 PM  Result Value Ref Range   Hemoglobin 9.9 (L) 12.0 - 15.0 g/dL   HCT 31.5 (L) 36.0 - 46.0 %  CBC     Status: Abnormal   Collection Time: 08/22/22  5:16 AM  Result Value Ref Range   WBC 10.6 (H) 4.0 - 10.5 K/uL   RBC 3.14 (L) 3.87 - 5.11 MIL/uL   Hemoglobin 9.3 (L) 12.0 - 15.0 g/dL   HCT 30.1 (L) 36.0 - 46.0 %   MCV 95.9 80.0 - 100.0 fL   MCH 29.6 26.0 - 34.0 pg   MCHC 30.9 30.0 - 36.0 g/dL   RDW 14.9 11.5 - 15.5 %   Platelets 108 (L) 150 - 400 K/uL   nRBC 0.0 0.0 - 0.2 %  Basic metabolic panel     Status: Abnormal   Collection Time: 08/22/22  5:16 AM  Result Value Ref Range   Sodium 136 135 - 145 mmol/L   Potassium 4.5 3.5 - 5.1 mmol/L   Chloride 106 98 - 111 mmol/L   CO2 26 22 - 32 mmol/L   Glucose, Bld 133 (H) 70 - 99 mg/dL   BUN 28 (H) 8 - 23 mg/dL   Creatinine, Ser 0.80 0.44 - 1.00 mg/dL   Calcium 8.2 (L) 8.9 - 10.3 mg/dL   GFR, Estimated >60 >60 mL/min   Anion gap 4 (L) 5 - 15   *Note: Due to a large number of  results and/or encounters for the requested time period, some results have not been displayed. A complete set of results can be found in Results Review.    DG Hip Port Unilat With Pelvis 1V Left  Result Date: 08/21/2022 CLINICAL DATA:  Post LEFT tip hemiarthroplasty EXAM: DG HIP (WITH OR WITHOUT PELVIS) 1V PORT LEFT COMPARISON:  Portable exam 1725 hours compared to 08/19/2022 FINDINGS: Interval resection of LEFT femoral head/neck and placement of a LEFT hip prosthesis. No acute fracture, dislocation, or bone destruction. IMPRESSION: LEFT femoral prosthesis without acute complication. Electronically Signed   By: Lavonia Dana M.D.   On: 08/21/2022 17:57   ECHOCARDIOGRAM COMPLETE  Result Date: 08/20/2022    ECHOCARDIOGRAM REPORT   Patient Name:   Crystal Bass Date of Exam: 08/20/2022 Medical Rec #:  [id number]         Height:       62.0 in Accession #:    [id number]  Weight:       132.2 lb Date of Birth:  [birth year]         BSA:          1.603 m Patient Age:    81 years          BP:           128/86 mmHg Patient Gender: F                 HR:           70 bpm. Exam Location:  [facility] Procedure: 2D Echo, Color Doppler and Cardiac Doppler Indications:     R06.00 Dyspnea  History:         Patient has prior history of Echocardiogram examinations, most                  recent 05/14/2022. MitraClip procedure 02/15/2022, COPD and                  History of left breast cancer; Risk Factors:Hypertension.  Sonographer:     Rosalia Hammers Referring Phys:  [id number] SHERI HAMMOCK Diagnosing Phys: [name] MD  Sonographer Comments: Image acquisition challenging due to mastectomy. IMPRESSIONS  1. Left ventricular ejection fraction, by estimation, is 60 to 65%. The left ventricle has normal function. The left ventricle has no regional wall motion abnormalities. There is mild left ventricular hypertrophy. Left ventricular diastolic parameters are consistent with Grade III diastolic dysfunction (restrictive).  2. Right  ventricular systolic function is normal. The right ventricular size is normal. There is moderately elevated pulmonary artery systolic pressure.  3. Left atrial size was severely dilated.  4. The mitral valve is normal in structure. Moderate to severe mitral valve regurgitation. Moderate mitral stenosis. s/p mitral valve clip.  5. The aortic valve is normal in structure. Aortic valve regurgitation is not visualized. No aortic stenosis is present.  6. The inferior vena cava is normal in size with greater than 50% respiratory variability, suggesting right atrial pressure of 3 mmHg. FINDINGS  Left Ventricle: Left ventricular ejection fraction, by estimation, is 60 to 65%. The left ventricle has normal function. The left ventricle has no regional wall motion abnormalities. The left ventricular internal cavity size was normal in size. There is  mild left ventricular hypertrophy. Left ventricular diastolic parameters are consistent with Grade III diastolic dysfunction (restrictive). Right Ventricle: The right ventricular size is normal. No increase in right ventricular wall thickness. Right ventricular systolic function is normal. There is moderately elevated pulmonary artery systolic pressure. The tricuspid regurgitant velocity is 3.32 m/s, and with an assumed right atrial pressure of 3 mmHg, the estimated right ventricular systolic pressure is 53.6 mmHg. Left Atrium: Left atrial size was severely dilated. Right Atrium: Right atrial size was normal in size. Pericardium: There is no evidence of pericardial effusion. Mitral Valve: The mitral valve is normal in structure. Moderate to severe mitral valve regurgitation. Moderate mitral valve stenosis. Tricuspid Valve: The tricuspid valve is normal in structure. Tricuspid valve regurgitation is mild . No evidence of tricuspid stenosis. Aortic Valve: The aortic valve is normal in structure. Aortic valve regurgitation is not visualized. No aortic stenosis is present. Aortic valve  mean gradient measures 4.0 mmHg. Aortic valve peak gradient measures 8.8 mmHg. Aortic valve area, by VTI measures 2.42 cm. Pulmonic Valve: The pulmonic valve was normal in structure. Pulmonic valve regurgitation is mild. No evidence of pulmonic stenosis. Aorta: The aortic root is normal in size and structure. Venous:  The inferior vena cava is normal in size with greater than 50% respiratory variability, suggesting right atrial pressure of 3 mmHg. IAS/Shunts: No atrial level shunt detected by color flow Doppler.  LEFT VENTRICLE PLAX 2D LVIDd:         5.10 cm   Diastology LVIDs:         3.20 cm   LV e' medial:   3.81 cm/s LV PW:         1.40 cm   LV E/e' medial: 59.8 LV IVS:        1.10 cm LVOT diam:     1.90 cm LV SV:         62 LV SV Index:   39 LVOT Area:     2.84 cm  RIGHT VENTRICLE RV Basal diam:  3.80 cm RV Mid diam:    3.60 cm RV S prime:     17.70 cm/s TAPSE (M-mode): 2.5 cm LEFT ATRIUM              Index        RIGHT ATRIUM           Index LA diam:        4.40 cm  2.74 cm/m   RA Area:     18.10 cm LA Vol (A2C):   160.0 ml 99.80 ml/m  RA Volume:   48.70 ml  30.38 ml/m LA Vol (A4C):   118.0 ml 73.60 ml/m LA Biplane Vol: 143.0 ml 89.19 ml/m  AORTIC VALVE                    PULMONIC VALVE AV Area (Vmax):    2.59 cm     PR End Diast Vel: 6.25 msec AV Area (Vmean):   2.41 cm AV Area (VTI):     2.42 cm AV Vmax:           148.00 cm/s AV Vmean:          95.000 cm/s AV VTI:            0.257 m AV Peak Grad:      8.8 mmHg AV Mean Grad:      4.0 mmHg LVOT Vmax:         135.00 cm/s LVOT Vmean:        80.800 cm/s LVOT VTI:          0.219 m LVOT/AV VTI ratio: 0.85  AORTA Ao Root diam: 3.70 cm MITRAL VALVE                TRICUSPID VALVE MV Area (PHT): 3.89 cm     TR Peak grad:   44.1 mmHg MV Decel Time: 195 msec     TR Vmax:        332.00 cm/s MV E velocity: 228.00 cm/s MV A velocity: 82.10 cm/s   SHUNTS MV E/A ratio:  2.78         Systemic VTI:  0.22 m MV A Prime:    5.9 cm/s     Systemic Diam: 1.90 cm [name] MD Electronically signed by [name] MD Signature Date/Time: 08/20/2022/3:55:10 PM    Final     Assessment/Plan: 1 Day Post-Op   Principal Problem:   Closed left hip fracture (HCC) Active Problems:   Essential hypertension   Chronic obstructive airway disease with asthma (HCC)   Dysphagia, pharyngoesophageal phase   Parkinson disease (HCC)   Vitamin B12 deficiency   Severe mitral regurgitation  S/P mitral valve clip implantation   Acute on chronic diastolic CHF (congestive heart failure) (HCC)   Anxiety   Malnutrition of moderate degree   AKI (acute kidney injury) ([place])  Patient had low urine output overnight.  Foley catheter was left in to monitor her fluid status.  I will defer to the medical team regarding management of low urine output given her cardiac issues and risk for CHF.  She will require a skilled nursing facility upon discharge.  Patient is weightbearing as tolerated in the left lower extremity with posterior hip precautions.  Patient will begin Lovenox for DVT prophylaxis.    Thornton [place] , MD 08/22/2022, 2:14 PM

## 2022-08-22 NOTE — Progress Notes (Signed)
Pt's BP is soft started at 89/56 and taken manually and is at 90/60. Hospitalist aware and given assessments: No chest pain, no shortness of breath and no difficulty breathing, no pain and bleeding on the surgical site, no tingling and no numbness. Urine output is scanty at 66m and 362mafter an hour. NP aware with new orders of STAT hct and hgb, taken by lab and reported results as well via secure chat. Bolus of Normal saline at 250 given, improved BP at 106/65.  NP ChZebedee Ibadvised that "as long as we are not seeing any bleeding or other changes, we can just check hgb with am labs".  Latest BP is again at 96/58, HR at 64, still no complaints of pain and bleeding, no chest pain or difficulty breathing. Will continue to monitor and report to hospitalist.

## 2022-08-22 NOTE — Plan of Care (Signed)

## 2022-08-22 NOTE — Progress Notes (Signed)
Physical Therapy Treatment Patient Details Name: Crystal Bass MRN: [id number] DOB: 1941-09-09 Today's Date: 08/22/2022   History of Present Illness Pt is 69 YOF admitted for closed L hip fx, now s/p L hip hemiarthroplasty. PMH includes: PD, mitral regurgitation, HFpEF, HTN, COPD, asthma.    PT Comments    Pt presents to PT in bed and agreeable to participate in therapy services. Pt was pleasant and motivated to participate during the session and put forth good effort throughout. Improved response time for questions and commands compared to evaluation. Pt is apprehensive with all mobility but presents with improved tolerance to activity. Still requires significant physical assistance for bed mobility, transfers, and amb. Able to take a few very small, shuffling steps towards [facility] before reporting fatigue and needing to return to sitting. Able to tolerate standing with assistance at EOB for 3 minutes, no posterior lean. Would benefit from skilled PT at SNF to address above deficits in strength, ROM, balance, functional mobility, and activity tolerance to promote optimal return to PLOF.    Recommendations for follow up therapy are one component of a multi-disciplinary discharge planning process, led by the attending physician.  Recommendations may be updated based on patient status, additional functional criteria and insurance authorization.  Follow Up Recommendations  Skilled nursing-short term rehab (<3 hours/day) Can patient physically be transported by private vehicle: No   Assistance Recommended at Discharge Frequent or constant Supervision/Assistance  Patient can return home with the following A lot of help with walking and/or transfers;A lot of help with bathing/dressing/bathroom;Assist for transportation;Assistance with cooking/housework   Equipment Recommendations  Rolling walker (2 wheels);BSC/3in1    Recommendations for Other Services       Precautions / Restrictions  Precautions Precautions: Posterior Hip;Fall Precaution Booklet Issued: Yes (comment) Restrictions Weight Bearing Restrictions: Yes LLE Weight Bearing: Weight bearing as tolerated     Mobility  Bed Mobility Overal bed mobility: Needs Assistance Bed Mobility: Supine to Sit, Sit to Supine     Supine to sit: Max assist, +2 for physical assistance Sit to supine: Max assist, +2 for physical assistance        Transfers Overall transfer level: Needs assistance Equipment used: Rolling walker (2 wheels) Transfers: Sit to/from Stand Sit to Stand: Mod assist, +2 physical assistance           General transfer comment: improved ability to weight bear through LE, less of a posterior lean than previously    Ambulation/Gait Ambulation/Gait assistance: Max assist Gait Distance (Feet): 1 Feet Assistive device: Rolling walker (2 wheels) Gait Pattern/deviations: Shuffle Gait velocity: decr     General Gait Details: able to perform very small shuffling steps at EOB   Stairs             Wheelchair Mobility    Modified Rankin (Stroke Patients Only)       Balance Overall balance assessment: Needs assistance Sitting-balance support: Bilateral upper extremity supported, Feet supported Sitting balance-Leahy Scale: Fair   Postural control: Right lateral lean Standing balance support: Bilateral upper extremity supported, During functional activity, Reliant on assistive device for balance Standing balance-Leahy Scale: Fair Standing balance comment: improved weight bearing through LEs                            Cognition Arousal/Alertness: Awake/alert Behavior During Therapy: Flat affect, Anxious Overall Cognitive Status: Difficult to assess  General Comments: oreiented to self and time, not location        Exercises Total Joint Exercises Ankle Circles/Pumps: AROM, Both, 10 reps Quad Sets: Strengthening, Both,  10 reps Gluteal Sets: Strengthening, Both, 10 reps Other Exercises Other Exercises: posterior hip precaution edu    General Comments        Pertinent Vitals/Pain Pain Assessment Pain Assessment: 0-10 Pain Score: 2  Pain Location: L hip Pain Descriptors / Indicators: Aching, Guarding Pain Intervention(s): Limited activity within patient's tolerance, Monitored during session, Repositioned    Home Living Family/patient expects to be discharged to:: Assisted living Living Arrangements: Alone                      Prior Function            PT Goals (current goals can now be found in the care plan section) Acute Rehab PT Goals Patient Stated Goal: to go grocery shopping PT Goal Formulation: With patient Time For Goal Achievement: 09/05/22 Progress towards PT goals: Progressing toward goals    Frequency    BID      PT Plan Current plan remains appropriate    Co-evaluation              AM-PAC PT "6 Clicks" Mobility   Outcome Measure  Help needed turning from your back to your side while in a flat bed without using bedrails?: A Lot Help needed moving from lying on your back to sitting on the side of a flat bed without using bedrails?: A Lot Help needed moving to and from a bed to a chair (including a wheelchair)?: A Lot Help needed standing up from a chair using your arms (e.g., wheelchair or bedside chair)?: A Lot Help needed to walk in hospital room?: Total Help needed climbing 3-5 steps with a railing? : Total 6 Click Score: 10    End of Session Equipment Utilized During Treatment: Gait belt;Oxygen Activity Tolerance: Patient limited by pain Patient left: in bed;with call bell/phone within reach;with bed alarm set;with SCD's reapplied   PT Visit Diagnosis: Other abnormalities of gait and mobility (R26.89);Muscle weakness (generalized) (M62.81);Pain Pain - Right/Left: Left Pain - part of body: Hip     Time: 2248-2500 PT Time Calculation (min)  (ACUTE ONLY): 33 min  Charges:  $Therapeutic Activity: 8-22 mins                     Glenice Laine MPH, SPT 08/22/22, 3:25 PM

## 2022-08-23 DIAGNOSIS — F419 Anxiety disorder, unspecified: Secondary | ICD-10-CM | POA: Diagnosis not present

## 2022-08-23 DIAGNOSIS — N179 Acute kidney failure, unspecified: Secondary | ICD-10-CM | POA: Diagnosis not present

## 2022-08-23 DIAGNOSIS — S72002A Fracture of unspecified part of neck of left femur, initial encounter for closed fracture: Secondary | ICD-10-CM | POA: Diagnosis not present

## 2022-08-23 DIAGNOSIS — I5033 Acute on chronic diastolic (congestive) heart failure: Secondary | ICD-10-CM | POA: Diagnosis not present

## 2022-08-23 LAB — CBC
HCT: 26.5 % — ABNORMAL LOW (ref 36.0–46.0)
Hemoglobin: 8.3 g/dL — ABNORMAL LOW (ref 12.0–15.0)
MCH: 29.9 pg (ref 26.0–34.0)
MCHC: 31.3 g/dL (ref 30.0–36.0)
MCV: 95.3 fL (ref 80.0–100.0)
Platelets: 107 10*3/uL — ABNORMAL LOW (ref 150–400)
RBC: 2.78 MIL/uL — ABNORMAL LOW (ref 3.87–5.11)
RDW: 14.7 % (ref 11.5–15.5)
WBC: 8.1 10*3/uL (ref 4.0–10.5)
nRBC: 0 % (ref 0.0–0.2)

## 2022-08-23 LAB — BASIC METABOLIC PANEL
Anion gap: 4 — ABNORMAL LOW (ref 5–15)
BUN: 29 mg/dL — ABNORMAL HIGH (ref 8–23)
CO2: 27 mmol/L (ref 22–32)
Calcium: 8.1 mg/dL — ABNORMAL LOW (ref 8.9–10.3)
Chloride: 103 mmol/L (ref 98–111)
Creatinine, Ser: 0.67 mg/dL (ref 0.44–1.00)
GFR, Estimated: >60 mL/min (ref >60)
Glucose, Bld: 100 mg/dL — ABNORMAL HIGH (ref 70–99)
Potassium: 4.3 mmol/L (ref 3.5–5.1)
Sodium: 134 mmol/L — ABNORMAL LOW (ref 135–145)

## 2022-08-23 LAB — SURGICAL PATHOLOGY

## 2022-08-23 MED FILL — Neomycin-Polymyxin B GU Irrigation Soln: Qty: 16 | Status: AC

## 2022-08-23 NOTE — Plan of Care (Signed)
  Problem: Education: Goal: Knowledge of General Education information will improve Description: Including pain rating scale, medication(s)/side effects and non-pharmacologic comfort measures 08/23/2022 0250 by Francis Dowse, RN Outcome: Progressing 08/23/2022 0250 by Francis Dowse, RN Outcome: Progressing   Problem: Health Behavior/Discharge Planning: Goal: Ability to manage health-related needs will improve 08/23/2022 0250 by Francis Dowse, RN Outcome: Progressing 08/23/2022 0250 by Francis Dowse, RN Outcome: Progressing   Problem: Clinical Measurements: Goal: Ability to maintain clinical measurements within normal limits will improve 08/23/2022 0250 by Francis Dowse, RN Outcome: Progressing 08/23/2022 0250 by Francis Dowse, RN Outcome: Progressing Goal: Will remain free from infection 08/23/2022 0250 by Francis Dowse, RN Outcome: Progressing 08/23/2022 0250 by Francis Dowse, RN Outcome: Progressing Goal: Diagnostic test results will improve 08/23/2022 0250 by Francis Dowse, RN Outcome: Progressing 08/23/2022 0250 by Francis Dowse, RN Outcome: Progressing Goal: Respiratory complications will improve 08/23/2022 0250 by Francis Dowse, RN Outcome: Progressing 08/23/2022 0250 by Francis Dowse, RN Outcome: Progressing Goal: Cardiovascular complication will be avoided 08/23/2022 0250 by Francis Dowse, RN Outcome: Progressing 08/23/2022 0250 by Francis Dowse, RN Outcome: Progressing   Problem: Activity: Goal: Risk for activity intolerance will decrease 08/23/2022 0250 by Francis Dowse, RN Outcome: Progressing 08/23/2022 0250 by Francis Dowse, RN Outcome: Progressing   Problem: Nutrition: Goal: Adequate nutrition will be maintained 08/23/2022 0250 by Francis Dowse, RN Outcome: Progressing 08/23/2022 0250 by Francis Dowse,  RN Outcome: Progressing   Problem: Coping: Goal: Level of anxiety will decrease 08/23/2022 0250 by Francis Dowse, RN Outcome: Progressing 08/23/2022 0250 by Francis Dowse, RN Outcome: Progressing   Problem: Elimination: Goal: Will not experience complications related to bowel motility 08/23/2022 0250 by Francis Dowse, RN Outcome: Progressing 08/23/2022 0250 by Francis Dowse, RN Outcome: Progressing Goal: Will not experience complications related to urinary retention Outcome: Progressing   Problem: Pain Managment: Goal: General experience of comfort will improve Outcome: Progressing   Problem: Safety: Goal: Ability to remain free from injury will improve Outcome: Progressing   Problem: Skin Integrity: Goal: Risk for impaired skin integrity will decrease Outcome: Progressing   Problem: Education: Goal: Verbalization of understanding the information provided (i.e., activity precautions, restrictions, etc) will improve Outcome: Progressing Goal: Individualized Educational Video(s) Outcome: Progressing   Problem: Activity: Goal: Ability to ambulate and perform ADLs will improve Outcome: Progressing   Problem: Clinical Measurements: Goal: Postoperative complications will be avoided or minimized Outcome: Progressing   Problem: Self-Concept: Goal: Ability to maintain and perform role responsibilities to the fullest extent possible will improve Outcome: Progressing   Problem: Pain Management: Goal: Pain level will decrease Outcome: Progressing

## 2022-08-23 NOTE — Progress Notes (Signed)
  Subjective:  POD #2 s/p left hip hemiarthroplasty. Patient reports left hip pain as mild at rest.  Patient is sleeping when I entered the room but is easily arousable.  She has not eaten much after dinner tray and states she does not have much of an appetite.  Patient has had her dressing changed by the RN due to sanguinous drainage.  Objective:   VITALS:   Vitals:   08/23/22 0518 08/23/22 0828 08/23/22 1308 08/23/22 1657  BP: 104/60 (!) 101/57 (!) 103/59 (!) 102/57  Pulse: 70 73 73 80  Resp: 16     Temp: 98.5 F (36.9 C) 98 F (36.7 C)  98.2 F (36.8 C)  TempSrc:      SpO2: 97% 98% 97% 96%  Weight:      Height:        PHYSICAL EXAM: Left lower extremity Neurovascular intact Sensation intact distally Intact pulses distally Dorsiflexion/Plantar flexion intact Incision: moderate drainage No cellulitis present Compartment soft  LABS  Results for orders placed or performed during the hospital encounter of 08/19/22 (from the past 24 hour(s))  CBC     Status: Abnormal   Collection Time: 08/23/22  4:09 AM  Result Value Ref Range   WBC 8.1 4.0 - 10.5 K/uL   RBC 2.78 (L) 3.87 - 5.11 MIL/uL   Hemoglobin 8.3 (L) 12.0 - 15.0 g/dL   HCT 26.5 (L) 36.0 - 46.0 %   MCV 95.3 80.0 - 100.0 fL   MCH 29.9 26.0 - 34.0 pg   MCHC 31.3 30.0 - 36.0 g/dL   RDW 14.7 11.5 - 15.5 %   Platelets 107 (L) 150 - 400 K/uL   nRBC 0.0 0.0 - 0.2 %  Basic metabolic panel     Status: Abnormal   Collection Time: 08/23/22  4:09 AM  Result Value Ref Range   Sodium 134 (L) 135 - 145 mmol/L   Potassium 4.3 3.5 - 5.1 mmol/L   Chloride 103 98 - 111 mmol/L   CO2 27 22 - 32 mmol/L   Glucose, Bld 100 (H) 70 - 99 mg/dL   BUN 29 (H) 8 - 23 mg/dL   Creatinine, Ser 0.67 0.44 - 1.00 mg/dL   Calcium 8.1 (L) 8.9 - 10.3 mg/dL   GFR, Estimated >60 >60 mL/min   Anion gap 4 (L) 5 - 15   *Note: Due to a large number of results and/or encounters for the requested time period, some results have not been displayed. A  complete set of results can be found in Results Review.    No results found.  Assessment/Plan: 2 Days Post-Op   Principal Problem:   Closed left hip fracture (HCC) Active Problems:   Essential hypertension   Chronic obstructive airway disease with asthma (HCC)   Dysphagia, pharyngoesophageal phase   Parkinson disease (HCC)   Vitamin B12 deficiency   Severe mitral regurgitation   S/P mitral valve clip implantation   Acute on chronic diastolic CHF (congestive heart failure) (HCC)   Anxiety   Malnutrition of moderate degree   AKI (acute kidney injury) ([place])  Recheck labs in the morning.  Continue physical and occupational therapy as the patient can tolerate.  Cardiology and hospitalist following.  Continue Lovenox for DVT prophylaxis.  Change dressings as needed.    Thornton Park , MD 08/23/2022, 5:52 PM

## 2022-08-23 NOTE — Progress Notes (Signed)
Progress Note  Patient: Crystal Bass YJE:[id number] DOB: [birth year]  DOA: 08/19/2022  DOS: 08/23/2022    Brief hospital course: PMH of Parkinson's disease, severe mitral regurgitation SP MitraClip with chronic HF PEF, HTN, COPD, asthma present to the hospital with complaints of a fall.  Etiology of the fall is not clear.  She was walking and lost her balance and fell on the left side. Appears to have acute displaced left femoral neck fracture.  Orthopedic consulted.  Scheduled for bipolar hemiarthroplasty 9/26. Also has acute on chronic HFpEF.  Cardiology consult for preop clearance.  Assessment and Plan: Closed left hip fracture: Due to fall. s/p IM nail 9/26 by Dr. Mack Guise - WBAT, posterior hip precautions.  - Wound care per orthopedics - Continue pain control per orthopedics. Appears to have quite severe pain, defer any further evaluation to orthopedics.   Acute on chronic HFpEF: Echo with LVEF 60-65%, G3DD with restrictive physiology, severe LAE, mod-severe MR, mod MS s/p mitral valve clip. RV normal.  - Received diuresis initially, postoperative hypotension has limited ongoing diuresis. Small bolus given. Now taking po, doesn't appear terribly overloaded. With hypotension will hold lasix '20mg'$  po daily home dose today.  Acute hypoxic respiratory failure: Suspected to be due to acute CHF and atelectasis contributing. Has resolved.  - Maintaining oxygen saturation of 95% on room air at this time.  - RN has provided incentive spirometry this AM which should continue. - Diuresed as above, limited currently.   Decreased UOP: Creatinine is stable, UOP picking up now.  - Monitor UOP and if stable creatinine, perform voiding trial in AM.   Acute blood loss anemia due to hip fracture and surgery:  - Hgb down a bit further to 8.3 this AM with no overt bleeding ongoing. s/p T&S. With her hypotension, would consider transfusion threshold to be 8g/dl.   Thrombocytopenia:  - Stable on  recheck this AM.  - Lovenox for VTE ppx per orthopedics.    Parkinson's disease: Bradykinetic/bradyphrenic. Increases risk of continued falls.  - Continue sinemet, PT/OT.   COPD, asthma: Quiescent.  - Continue breo/incruse and prn BD.   Hypokalemia: Resolved.   AKI: Resolved with diuresis, recheck in AM.    Anxiety - Continue clonazepam, gabapentin.   Moderate protein calorie malnutrition: - Boost Breeze, MVI, Prostat   Subjective: Pt has IS at home but hasn't used it here, denies any shortness of breath or chest pain. She reports severe pain in the left leg, finds it difficult to elaborate, this has limited her participation with PT. She denies numbness or weakness and is very reluctant to move the leg but is able to do so.   Objective: Vitals:   08/22/22 1930 08/23/22 0000 08/23/22 0518 08/23/22 0828  BP: (!) 91/53 (!) 101/49 104/60 (!) 101/57  Pulse: 69 69 70 73  Resp: '17 17 16   '$ Temp: 97.8 F (36.6 C) 98.1 F (36.7 C) 98.5 F (36.9 C) 98 F (36.7 C)  TempSrc:      SpO2: 98% 99% 97% 98%  Weight:      Height:       Gen: Elderly adult in no distress Pulm: Nonlabored breathing 1LPM. There are crackles at bases that cleared with repeated inspiration and coughing. No wheezes. CV: Regular rate and rhythm, some PACs. Stable systolic murmur without rub, or gallop. No JVD, no pitting dependent edema. GI: Abdomen soft, non-tender, non-distended, with normoactive bowel sounds.  GU: Foley in place this AM Ext: Warm, dry, LLE internally rotated  without significant length disparity.  Skin: Left lateral thigh incision with overlapping dressings with stable bleed, no increased bleeding or any ecchymoses.  Neuro: Slow responses but appropriate and alert and oriented. LLE movement limited by pain but intact motor function at toes, ankle and knee. Sensation preserved. LUE without limitation. focal neurological deficits. Psych: Judgement and insight appear fair at this time. Mood euthymic  & affect congruent. Behavior is appropriate.    Data Personally reviewed: CBC: Recent Labs  Lab 08/19/22 2106 08/20/22 0555 08/21/22 0755 08/21/22 2254 08/22/22 0516 08/23/22 0409  WBC 6.5 7.6 7.9  --  10.6* 8.1  NEUTROABS 5.0  --   --   --   --   --   HGB 12.0 11.7* 11.5* 9.9* 9.3* 8.3*  HCT 37.9 36.0 37.1 31.5* 30.1* 26.5*  MCV 94.3 92.5 95.1  --  95.9 95.3  PLT 151 146* 123*  --  108* 326*   Basic Metabolic Panel: Recent Labs  Lab 08/19/22 2106 08/20/22 0555 08/21/22 0755 08/22/22 0516 08/23/22 0409  NA 141 141 138 136 134*  K 3.6 3.4* 4.8 4.5 4.3  CL 103 103 104 106 103  CO2 '30 30 28 26 27  '$ GLUCOSE 118* 106* 107* 133* 100*  BUN '22 21 21 '$ 28* 29*  CREATININE 1.21* 0.80 0.77 0.80 0.67  CALCIUM 8.8* 8.5* 9.1 8.2* 8.1*  MG  --   --  2.3  --   --    GFR: Estimated Creatinine Clearance (by C-G formula based on SCr of 0.67 mg/dL) Female: 48.8 mL/min Female: 57.8 mL/min Liver Function Tests: No results for input(s): "AST", "ALT", "ALKPHOS", "BILITOT", "PROT", "ALBUMIN" in the last 168 hours. No results for input(s): "LIPASE", "AMYLASE" in the last 168 hours. No results for input(s): "AMMONIA" in the last 168 hours. Coagulation Profile: No results for input(s): "INR", "PROTIME" in the last 168 hours. Cardiac Enzymes: No results for input(s): "CKTOTAL", "CKMB", "CKMBINDEX", "TROPONINI" in the last 168 hours. BNP (last 3 results) Recent Labs    04/30/22 1249  PROBNP 403   HbA1C: No results for input(s): "HGBA1C" in the last 72 hours. CBG: No results for input(s): "GLUCAP" in the last 168 hours. Lipid Profile: No results for input(s): "CHOL", "HDL", "LDLCALC", "TRIG", "CHOLHDL", "LDLDIRECT" in the last 72 hours. Thyroid Function Tests: No results for input(s): "TSH", "T4TOTAL", "FREET4", "T3FREE", "THYROIDAB" in the last 72 hours. Anemia Panel: No results for input(s): "VITAMINB12", "FOLATE", "FERRITIN", "TIBC", "IRON", "RETICCTPCT" in the last 72 hours. Urine  analysis:    Component Value Date/Time   COLORURINE STRAW (A) 06/15/2022 1047   APPEARANCEUR CLEAR 06/15/2022 1047   LABSPEC 1.008 06/15/2022 1047   PHURINE 6.0 06/15/2022 1047   GLUCOSEU NEGATIVE 06/15/2022 1047   HGBUR NEGATIVE 06/15/2022 [place] 06/15/2022 1047   BILIRUBINUR n 03/25/2020 1408   KETONESUR NEGATIVE 06/15/2022 1047   PROTEINUR NEGATIVE 06/15/2022 1047   UROBILINOGEN negative (A) 03/25/2020 1408   NITRITE NEGATIVE 06/15/2022 1047   LEUKOCYTESUR TRACE (A) 06/15/2022 1047   No results found for this or any previous visit (from the past 240 hour(s)).   DG Hip Port Unilat With Pelvis 1V Left  Result Date: 08/21/2022 CLINICAL DATA:  Post LEFT tip hemiarthroplasty EXAM: DG HIP (WITH OR WITHOUT PELVIS) 1V PORT LEFT COMPARISON:  Portable exam 1725 hours compared to 08/19/2022 FINDINGS: Interval resection of LEFT femoral head/neck and placement of a LEFT hip prosthesis. No acute fracture, dislocation, or bone destruction. IMPRESSION: LEFT femoral prosthesis without acute complication.  Electronically Signed   By: Lavonia Dana M.D.   On: 08/21/2022 17:57    Family Communication: None at bedside  Disposition: Status is: Inpatient Remains inpatient appropriate because: Unsafe DC, requires SNF.  Planned Discharge Destination: Skilled nursing facility    Patrecia Pour, MD 08/23/2022 10:51 AM Page by [email or web address]

## 2022-08-23 NOTE — Progress Notes (Signed)
16 Fr foley catheter discontinued per order at this time.Pt tolerated procedure without any discomfort.10cc NS pulled from F/C balloon. catheter tip intact.Pt encouraged to consume fluids.Pt expected to void in 6 hrs time per protocol.

## 2022-08-23 NOTE — Consult Note (Signed)
   Heart Failure Nurse Navigator Note  HFpEF 60-65%.  Mild LVH.  Grade III diastolic dysfunction.  Moderate to severe Mitral regurgitation.  Moderate mitral stenosis.  She presented to the ED after she sustained a fall at home.  Was noted on chest x-ray to have pulmonary edema, BNP level of 456.   Comorbidities:  Asthma COPD HTN Parkinson  OA Mitral clipping x2    Medications:  Home Meds Amlodipine 2.5 mg daily Aspirin 81 mg daily Potassium 20 meq daily  Lasix 20 mg daily  Labs:  Sodium 134,potassium 4.3, chloride 103, CO2 27, Bun 29, creatinine 0.67. No weight documented. Intake 240 mL Output 850 mL  At time of consult she is sitting up in the chair at bedside, lunch is on the tray in front of her, but she has not touched it.  Discussed heart failure and what it means.  She states prior to admission she was living at home by herself, preparing her own meals, though admitted to eating at restaurants also.  When questioned as to what she likes to fix for herself, she is slow to answer at times.  Admits to bacon and eggs- state she knows" that is not good for her," other time will eat cereal.  She also admits to eating soup from a can, not the low sodium ones.  Discussed discharge plans, she states that the plan is to go to rehab and then with a big smile she states she hopes to return to her own home.  She had no further questions.  Was given the living with heart failure teaching booklet, zone magnet, info on heart failure and low sodium along with weight chart.  She has no further questions.  She has a scheduled appointment with the heart failure clinic and October 9 at 2 PM.  17% no show, 4 of 359 appointments.  Pricilla Riffle RN [facility]

## 2022-08-23 NOTE — Progress Notes (Signed)
Nutrition Follow-up  DOCUMENTATION CODES:   Non-severe (moderate) malnutrition in context of chronic illness  INTERVENTION:   -Continue Boost Breeze po TID, each supplement provides 250 kcal and 9 grams of protein  -Continue 30 ml Prosource Plus BID, each supplement provides 100 kcals and 15 grams protein -Continue MVI with minerals daily  NUTRITION DIAGNOSIS:   Moderate Malnutrition related to chronic illness (Parkinsons, CHF, COPD) as evidenced by mild fat depletion, moderate fat depletion, mild muscle depletion, moderate muscle depletion.  Ongoing  GOAL:   Patient will meet greater than or equal to 90% of their needs  Progressing   MONITOR:   PO intake, Supplement acceptance  REASON FOR ASSESSMENT:   Consult Assessment of nutrition requirement/status, Hip fracture protocol  ASSESSMENT:   Pt with PMH of Parkinson's disease, severe mitral regurgitation SP MitraClip with chronic HF PEF, HTN, COPD, asthma presents with complaints of a fall.  9/26- s/p Procedure(s): Left hip hemiarthroplasty  Reviewed I/O's: -610 ml x 24 hours and -2.5 L since admission  UOP: 850 ml x 24 hours   Pt unavailable at time of visit. Attempted to speak with pt via call to hospital room phone, however, unable to reach.   Pt with improved oral intake. Noted meal completions 50-100%. Pt is consuming Boost Breeze and Prosource supplements.   Per TOC notes, plan to d/c to SNF once insurance authorization has been obtained.   Medications reviewed and include sinemet, colace, and senokot. Bowel regimen ordered on 08/21/22.   Labs reviewed: Na: 134.    Diet Order:   Diet Order             Diet regular Room service appropriate? Yes; Fluid consistency: Thin  Diet effective now                   EDUCATION NEEDS:   No education needs have been identified at this time  Skin:  Skin Assessment: Reviewed RN Assessment  Last BM:  08/16/22  Height:   Ht Readings from Last 1  Encounters:  08/21/22 '5\' 2"'$  (1.575 m)    Weight:   Wt Readings from Last 1 Encounters:  08/21/22 60.4 kg    Ideal Body Weight:  50 kg  BMI:  Body mass index is 24.35 kg/m.  Estimated Nutritional Needs:   Kcal:  1500-1700  Protein:  75-90 grams  Fluid:  > 1.5 L    Loistine Chance, RD, LDN, [place] Registered Dietitian II Certified Diabetes Care and Education Specialist Please refer to [facility] for RD and/or RD on-call/weekend/after hours pager

## 2022-08-23 NOTE — TOC Progression Note (Signed)
Transition of Care [facility]) - Progression Note    Patient Details  Name: Crystal Bass MRN: [id number] Date of Birth: [birth year]  Transition of Care [facility] CM/SW [place], RN Phone Number: 08/23/2022, 1:09 PM  Clinical Narrative:    The patient will DC to [facility] started and is pending Ref number [id number]   Expected Discharge Plan: [place] Barriers to Discharge: Continued Medical Work up, Orthoptist and Services Expected Discharge Plan: [place]                                               Social Determinants of Health (SDOH) Interventions    Readmission Risk Interventions     No data to display

## 2022-08-23 NOTE — Progress Notes (Signed)
Physical Therapy Treatment Patient Details Name: Crystal Bass MRN: [id number] DOB: [birth year] Today's Date: 08/23/2022   History of Present Illness Pt is 89 YOF admitted for closed L hip fx, now s/p L hip hemiarthroplasty. PMH includes: PD, mitral regurgitation, HFpEF, HTN, COPD, asthma.    PT Comments    Pt was asleep in supine upon arriving. She easily awakes and agrees to session however present with flat affect. Needs prolong time to respond to all questions. Also requires increased time to perform all physical activity and mobility. May be due to parkinson however no family present to determine baseline. Pt is oriented x 3 and answers most questions appropriately. She endorses severe pain with any/all movements of LLE. Presents with LLE internal rotation with constant vcs for external rotation. Pt unable. She did tolerate sitting up EOB x ~ 20 minutes however does have some hallucinations. " Do you see all those bugs on the floor?" Unsure if this is new. Pt required max assist to achieve EOB sitting + max assist to return to supine. Increased time due to pain/cognition. She did perform several exercises in bed and while seated EOB but was unwilling to attempt standing due to pain. BP supine: 113/62(76) ,sitting EOB 94/56(69), after 2 minutes EOB BP 106/72, After returning to supine BP 107/59. Highly recommend DC to SNF (At [facility] where she lives on independent side) to maximize independence while decreasing caregiver burden.     Recommendations for follow up therapy are one component of a multi-disciplinary discharge planning process, led by the attending physician.  Recommendations may be updated based on patient status, additional functional criteria and insurance authorization.  Follow Up Recommendations  Skilled nursing-short term rehab (<3 hours/day)     Assistance Recommended at Discharge Frequent or constant Supervision/Assistance  Patient can return home with the following A  lot of help with walking and/or transfers;A lot of help with bathing/dressing/bathroom;Assist for transportation;Assistance with cooking/housework   Equipment Recommendations  Rolling walker (2 wheels);BSC/3in1       Precautions / Restrictions Precautions Precautions: Posterior Hip;Fall Precaution Booklet Issued: Yes (comment) Restrictions Weight Bearing Restrictions: Yes LLE Weight Bearing: Weight bearing as tolerated     Mobility  Bed Mobility Overal bed mobility: Needs Assistance Bed Mobility: Supine to Sit, Sit to Supine     Supine to sit: Max assist Sit to supine: Max assist   General bed mobility comments: pt requires extensive assistance to achieve EOB sitting. Max assist to achieve EOB short sit and then max assist to return to supine. LLE internal rotated throughout with pt struggling to externally rotate actively    Transfers    General transfer comment: Not attempted, pt declines, 2/2 pain. Encouraged transfer however pt requested to return to supine. she did perform several exercises while seated EOB with increased time and vcs for correct performance.    Ambulation/Gait    General Gait Details: pt unwilling at this time due to pain. will attempt in PM/BID session.   Balance Overall balance assessment: Needs assistance Sitting-balance support: Bilateral upper extremity supported, Feet supported Sitting balance-Leahy Scale: Fair Sitting balance - Comments: initially posterior leaning upon sitting EOB however able to progress to supervision only. sat EOB and perform exercises x ~ 15-20 minutes. prolonged time required to perform all task.    Cognition Arousal/Alertness: Awake/alert Behavior During Therapy: Flat affect Overall Cognitive Status: No family/caregiver present to determine baseline cognitive functioning    General Comments: Pt presents with flat affect. Requires prolong response time. No  family available to state baseline cognition however pt was able  to answer most orientation questions correctly with increased time.               Pertinent Vitals/Pain Pain Assessment Pain Assessment: 0-10 Pain Score: 7  Pain Location: L hip Pain Descriptors / Indicators: Aching, Guarding, Grimacing Pain Intervention(s): Limited activity within patient's tolerance, Monitored during session, Premedicated before session, Repositioned, Ice applied     PT Goals (current goals can now be found in the care plan section) Acute Rehab PT Goals Patient Stated Goal: return to [facility] Progress towards PT goals: Not progressing toward goals - comment (pain limited)    Frequency    BID      PT Plan Current plan remains appropriate       AM-PAC PT "6 Clicks" Mobility   Outcome Measure  Help needed turning from your back to your side while in a flat bed without using bedrails?: A Lot Help needed moving from lying on your back to sitting on the side of a flat bed without using bedrails?: A Lot Help needed moving to and from a bed to a chair (including a wheelchair)?: A Lot Help needed standing up from a chair using your arms (e.g., wheelchair or bedside chair)?: Total Help needed to walk in hospital room?: Total Help needed climbing 3-5 steps with a railing? : Total 6 Click Score: 9    End of Session Equipment Utilized During Treatment:  (pt not on O2 throughout session with sao2 > 92%) Activity Tolerance: Patient limited by pain Patient left: in bed;with call bell/phone within reach;with bed alarm set;with SCD's reapplied Nurse Communication: Mobility status PT Visit Diagnosis: Other abnormalities of gait and mobility (R26.89);Muscle weakness (generalized) (M62.81);Pain Pain - Right/Left: Left Pain - part of body: Hip     Time: 2820-8138 PT Time Calculation (min) (ACUTE ONLY): 29 min  Charges:  $Therapeutic Exercise: 8-22 mins $Therapeutic Activity: 8-22 mins                    Julaine Fusi PTA 08/23/22, 8:58 AM

## 2022-08-23 NOTE — Progress Notes (Signed)
Occupational Therapy Treatment Patient Details Name: Crystal Bass MRN: [id number] DOB: February 23, 1941 Today's Date: 08/23/2022   History of present illness Pt is 4 YOF admitted for closed L hip fx, now s/p L hip hemiarthroplasty. PMH includes: PD, mitral regurgitation, HFpEF, HTN, COPD, asthma.   OT comments  Pt seen for OT tx. Pt up in recliner, endorses feeling sleepy. BP 103/59, SpO2 97% on room air, HR 73. With conversation, alertness improves. Pt required increased processing time to initiate tasks along with intermittent VC. Pt completed grooming tasks seated in recliner with setup and intermittent VC for initiation. Increased time to complete as well. MIN A to complete hair brushing on the back of her head. Pt did not recall what she was here for, denied pain but stated "I hope I don't have pain," and did not visualize or verbalize pain with LLE repositioning for improved adherence to posterior hip precautions, which she was not able to recall. Pt continues to benefit from skilled OT services to maximize return to PLOF.    Recommendations for follow up therapy are one component of a multi-disciplinary discharge planning process, led by the attending physician.  Recommendations may be updated based on patient status, additional functional criteria and insurance authorization.    Follow Up Recommendations  Skilled nursing-short term rehab (<3 hours/day)    Assistance Recommended at Discharge Frequent or constant Supervision/Assistance  Patient can return home with the following  A lot of help with walking and/or transfers;A lot of help with bathing/dressing/bathroom;Assistance with cooking/housework;Help with stairs or ramp for entrance   Equipment Recommendations       Recommendations for Other Services      Precautions / Restrictions Precautions Precautions: Posterior Hip;Fall Restrictions Weight Bearing Restrictions: Yes LLE Weight Bearing: Weight bearing as tolerated        Mobility Bed Mobility               General bed mobility comments: NT, up in recliner    Transfers                   General transfer comment: pt declined     Balance Overall balance assessment: Needs assistance Sitting-balance support: Bilateral upper extremity supported, Feet supported, Single extremity supported Sitting balance-Leahy Scale: Fair                                     ADL either performed or assessed with clinical judgement   ADL                                         General ADL Comments: Pt completed grooming tasks seated in recliner with setup and intermittent VC for initiation. Increased time to complete as well. MIN A to complete hair brushing on the back of her head.    Extremity/Trunk Assessment              Vision       Perception     Praxis      Cognition Arousal/Alertness: Awake/alert Behavior During Therapy: Flat affect Overall Cognitive Status: No family/caregiver present to determine baseline cognitive functioning                                 General Comments:  Increased processing time, time to respond to therapist, VC to initiate tasks        Exercises Other Exercises Other Exercises: pillow placed between legs to help maximize adherence to posterior THPs, LLE internally rotated slightly upon OT's arrival, improved positioning with pillow    Shoulder Instructions       General Comments      Pertinent Vitals/ Pain       Pain Assessment Pain Assessment: Faces Faces Pain Scale: No hurt Pain Location: L hip Pain Intervention(s): Limited activity within patient's tolerance, Monitored during session, Repositioned  Home Living                                          Prior Functioning/Environment              Frequency  Min 2X/week        Progress Toward Goals  OT Goals(current goals can now be found in the care plan section)   Progress towards OT goals: Progressing toward goals  Acute Rehab OT Goals Patient Stated Goal: to get back home OT Goal Formulation: With patient Time For Goal Achievement: 09/05/22 Potential to Achieve Goals: Good  Plan Discharge plan remains appropriate;Frequency remains appropriate    Co-evaluation                 AM-PAC OT "6 Clicks" Daily Activity     Outcome Measure   Help from another person eating meals?: None Help from another person taking care of personal grooming?: A Little   Help from another person bathing (including washing, rinsing, drying)?: A Lot Help from another person to put on and taking off regular upper body clothing?: A Little Help from another person to put on and taking off regular lower body clothing?: A Lot 6 Click Score: 14    End of Session    OT Visit Diagnosis: Unsteadiness on feet (R26.81);Muscle weakness (generalized) (M62.81);Pain Pain - Right/Left: Left Pain - part of body: Hip   Activity Tolerance Patient tolerated treatment well   Patient Left in chair;with call bell/phone within reach;with chair alarm set   Nurse Communication          Time: 4680-3212 OT Time Calculation (min): 11 min  Charges: OT General Charges $OT Visit: 1 Visit OT Treatments $Self Care/Home Management : 8-22 mins  Ardeth Perfect., MPH, MS, OTR/L ascom [phone] 08/23/22, 1:16 PM

## 2022-08-24 ENCOUNTER — Inpatient Hospital Stay: Payer: Medicare PPO

## 2022-08-24 DIAGNOSIS — N179 Acute kidney failure, unspecified: Secondary | ICD-10-CM | POA: Diagnosis not present

## 2022-08-24 DIAGNOSIS — D62 Acute posthemorrhagic anemia: Secondary | ICD-10-CM | POA: Diagnosis not present

## 2022-08-24 DIAGNOSIS — S72002D Fracture of unspecified part of neck of left femur, subsequent encounter for closed fracture with routine healing: Secondary | ICD-10-CM | POA: Diagnosis not present

## 2022-08-24 DIAGNOSIS — I110 Hypertensive heart disease with heart failure: Secondary | ICD-10-CM | POA: Diagnosis present

## 2022-08-24 DIAGNOSIS — I5032 Chronic diastolic (congestive) heart failure: Secondary | ICD-10-CM | POA: Diagnosis present

## 2022-08-24 DIAGNOSIS — Z95818 Presence of other cardiac implants and grafts: Secondary | ICD-10-CM | POA: Diagnosis not present

## 2022-08-24 DIAGNOSIS — I052 Rheumatic mitral stenosis with insufficiency: Secondary | ICD-10-CM | POA: Diagnosis not present

## 2022-08-24 DIAGNOSIS — Z8719 Personal history of other diseases of the digestive system: Secondary | ICD-10-CM | POA: Diagnosis not present

## 2022-08-24 DIAGNOSIS — M25552 Pain in left hip: Secondary | ICD-10-CM | POA: Diagnosis not present

## 2022-08-24 DIAGNOSIS — G20A1 Parkinson's disease without dyskinesia, without mention of fluctuations: Secondary | ICD-10-CM | POA: Diagnosis not present

## 2022-08-24 DIAGNOSIS — Z7189 Other specified counseling: Secondary | ICD-10-CM | POA: Diagnosis not present

## 2022-08-24 DIAGNOSIS — I340 Nonrheumatic mitral (valve) insufficiency: Secondary | ICD-10-CM | POA: Diagnosis not present

## 2022-08-24 DIAGNOSIS — S72002A Fracture of unspecified part of neck of left femur, initial encounter for closed fracture: Secondary | ICD-10-CM | POA: Diagnosis not present

## 2022-08-24 DIAGNOSIS — I5033 Acute on chronic diastolic (congestive) heart failure: Secondary | ICD-10-CM | POA: Diagnosis not present

## 2022-08-24 DIAGNOSIS — Z9889 Other specified postprocedural states: Secondary | ICD-10-CM | POA: Diagnosis not present

## 2022-08-24 DIAGNOSIS — R918 Other nonspecific abnormal finding of lung field: Secondary | ICD-10-CM | POA: Diagnosis not present

## 2022-08-24 DIAGNOSIS — Z96642 Presence of left artificial hip joint: Secondary | ICD-10-CM | POA: Diagnosis not present

## 2022-08-24 DIAGNOSIS — I272 Pulmonary hypertension, unspecified: Secondary | ICD-10-CM | POA: Diagnosis not present

## 2022-08-24 DIAGNOSIS — W19XXXA Unspecified fall, initial encounter: Secondary | ICD-10-CM | POA: Diagnosis not present

## 2022-08-24 DIAGNOSIS — I951 Orthostatic hypotension: Secondary | ICD-10-CM | POA: Diagnosis not present

## 2022-08-24 DIAGNOSIS — K219 Gastro-esophageal reflux disease without esophagitis: Secondary | ICD-10-CM | POA: Diagnosis not present

## 2022-08-24 DIAGNOSIS — Z853 Personal history of malignant neoplasm of breast: Secondary | ICD-10-CM | POA: Diagnosis not present

## 2022-08-24 DIAGNOSIS — R300 Dysuria: Secondary | ICD-10-CM | POA: Diagnosis not present

## 2022-08-24 DIAGNOSIS — R0902 Hypoxemia: Secondary | ICD-10-CM | POA: Diagnosis not present

## 2022-08-24 DIAGNOSIS — F419 Anxiety disorder, unspecified: Secondary | ICD-10-CM | POA: Diagnosis not present

## 2022-08-24 DIAGNOSIS — Z9181 History of falling: Secondary | ICD-10-CM | POA: Diagnosis not present

## 2022-08-24 DIAGNOSIS — Z803 Family history of malignant neoplasm of breast: Secondary | ICD-10-CM | POA: Diagnosis not present

## 2022-08-24 DIAGNOSIS — M810 Age-related osteoporosis without current pathological fracture: Secondary | ICD-10-CM | POA: Diagnosis not present

## 2022-08-24 DIAGNOSIS — M6281 Muscle weakness (generalized): Secondary | ICD-10-CM | POA: Diagnosis not present

## 2022-08-24 DIAGNOSIS — Z66 Do not resuscitate: Secondary | ICD-10-CM | POA: Diagnosis not present

## 2022-08-24 DIAGNOSIS — I10 Essential (primary) hypertension: Secondary | ICD-10-CM | POA: Diagnosis not present

## 2022-08-24 DIAGNOSIS — J449 Chronic obstructive pulmonary disease, unspecified: Secondary | ICD-10-CM | POA: Diagnosis not present

## 2022-08-24 DIAGNOSIS — J4489 Other specified chronic obstructive pulmonary disease: Secondary | ICD-10-CM | POA: Diagnosis not present

## 2022-08-24 DIAGNOSIS — G479 Sleep disorder, unspecified: Secondary | ICD-10-CM | POA: Diagnosis not present

## 2022-08-24 DIAGNOSIS — M79641 Pain in right hand: Secondary | ICD-10-CM | POA: Diagnosis not present

## 2022-08-24 DIAGNOSIS — J811 Chronic pulmonary edema: Secondary | ICD-10-CM | POA: Diagnosis not present

## 2022-08-24 LAB — CBC
HCT: 25.1 % — ABNORMAL LOW (ref 36.0–46.0)
Hemoglobin: 8.1 g/dL — ABNORMAL LOW (ref 12.0–15.0)
MCH: 30.5 pg (ref 26.0–34.0)
MCHC: 32.3 g/dL (ref 30.0–36.0)
MCV: 94.4 fL (ref 80.0–100.0)
Platelets: 133 10*3/uL — ABNORMAL LOW (ref 150–400)
RBC: 2.66 MIL/uL — ABNORMAL LOW (ref 3.87–5.11)
RDW: 14.8 % (ref 11.5–15.5)
WBC: 7.3 10*3/uL (ref 4.0–10.5)
nRBC: 0 % (ref 0.0–0.2)

## 2022-08-24 LAB — BASIC METABOLIC PANEL
Anion gap: 5 (ref 5–15)
BUN: 26 mg/dL — ABNORMAL HIGH (ref 8–23)
CO2: 25 mmol/L (ref 22–32)
Calcium: 8.3 mg/dL — ABNORMAL LOW (ref 8.9–10.3)
Chloride: 103 mmol/L (ref 98–111)
Creatinine, Ser: 0.57 mg/dL (ref 0.44–1.00)
GFR, Estimated: >60 mL/min (ref >60)
Glucose, Bld: 103 mg/dL — ABNORMAL HIGH (ref 70–99)
Potassium: 4.2 mmol/L (ref 3.5–5.1)
Sodium: 133 mmol/L — ABNORMAL LOW (ref 135–145)

## 2022-08-24 MED ORDER — ENOXAPARIN SODIUM 40 MG/0.4ML IJ SOSY: 40.0000 mg | PREFILLED_SYRINGE | INTRAMUSCULAR | 0 refills | Status: DC

## 2022-08-24 MED ORDER — FERROUS SULFATE 325 (65 FE) MG PO TABS: 325.0000 mg | ORAL_TABLET | Freq: Two times a day (BID) | ORAL | 3 refills | Status: DC

## 2022-08-24 MED ORDER — CLONAZEPAM 0.5 MG PO TABS: ORAL_TABLET | ORAL | 0 refills | Status: DC

## 2022-08-24 MED ORDER — SENNA 8.6 MG PO TABS: 1.0000 | ORAL_TABLET | Freq: Two times a day (BID) | ORAL | 0 refills | Status: DC

## 2022-08-24 MED ORDER — MORPHINE SULFATE 30 MG PO TABS: 30.0000 mg | ORAL_TABLET | Freq: Three times a day (TID) | ORAL | 0 refills | Status: DC | PRN

## 2022-08-24 MED ORDER — TRAMADOL HCL 50 MG PO TABS: 50.0000 mg | ORAL_TABLET | Freq: Four times a day (QID) | ORAL | 0 refills | Status: DC

## 2022-08-24 MED ORDER — METHOCARBAMOL 500 MG PO TABS: 500.0000 mg | ORAL_TABLET | Freq: Four times a day (QID) | ORAL | 0 refills | Status: DC | PRN

## 2022-08-24 NOTE — Plan of Care (Signed)

## 2022-08-24 NOTE — TOC Progression Note (Addendum)
Transition of Care [facility]) - Progression Note    Patient Details  Name: Crystal Bass MRN: [id number] Date of Birth: 31-Oct-1941  Transition of Care [facility]) CM/SW [place], RN Phone Number: 08/24/2022, 11:14 AM  Clinical Narrative:     Patient going to room 118 at [facility] called to transport [name] the patient's niece is on way to the hospital and is aware of the DC and room number   Expected Discharge Plan: Skilled Nursing Facility Barriers to Discharge: Continued Medical Work up, Ship broker  Expected Discharge Plan and Services Expected Discharge Plan: [place]         Expected Discharge Date: 08/24/22                                     Social Determinants of Health (SDOH) Interventions    Readmission Risk Interventions     No data to display

## 2022-08-24 NOTE — Progress Notes (Signed)
Subjective:  Patient was discharged back to [facility] today before I made rounds.  POD #3 s/p left hip hemiarthroplasty.   I discussed postoperative instructions with Dr. Manuella Ghazi, the hospitalist prior to discharge.  Patient will follow-up with me in 10 to 14 days.  Objective:   VITALS:   Vitals:   08/23/22 2215 08/24/22 0007 08/24/22 0240 08/24/22 0747  BP:  (!) 99/53 (!) 101/53 (!) 102/54  Pulse:  69 68 70  Resp:  17  16  Temp:  98.5 F (36.9 C)  98.4 F (36.9 C)  TempSrc:      SpO2: 94% 98% 100% 99%  Weight:      Height:        PHYSICAL EXAM: Not performed as patient was discharged.  LABS  Results for orders placed or performed during the hospital encounter of 08/19/22 (from the past 24 hour(s))  CBC     Status: Abnormal   Collection Time: 08/24/22  6:58 AM  Result Value Ref Range   WBC 7.3 4.0 - 10.5 K/uL   RBC 2.66 (L) 3.87 - 5.11 MIL/uL   Hemoglobin 8.1 (L) 12.0 - 15.0 g/dL   HCT 25.1 (L) 36.0 - 46.0 %   MCV 94.4 80.0 - 100.0 fL   MCH 30.5 26.0 - 34.0 pg   MCHC 32.3 30.0 - 36.0 g/dL   RDW 14.8 11.5 - 15.5 %   Platelets 133 (L) 150 - 400 K/uL   nRBC 0.0 0.0 - 0.2 %  Basic metabolic panel     Status: Abnormal   Collection Time: 08/24/22  6:58 AM  Result Value Ref Range   Sodium 133 (L) 135 - 145 mmol/L   Potassium 4.2 3.5 - 5.1 mmol/L   Chloride 103 98 - 111 mmol/L   CO2 25 22 - 32 mmol/L   Glucose, Bld 103 (H) 70 - 99 mg/dL   BUN 26 (H) 8 - 23 mg/dL   Creatinine, Ser 0.57 0.44 - 1.00 mg/dL   Calcium 8.3 (L) 8.9 - 10.3 mg/dL   GFR, Estimated >60 >60 mL/min   Anion gap 5 5 - 15   *Note: Due to a large number of results and/or encounters for the requested time period, some results have not been displayed. A complete set of results can be found in Results Review.    DG Chest Port 1 View  Result Date: 08/24/2022 CLINICAL DATA:  [id number] hypoxia EXAM: PORTABLE CHEST 1 VIEW COMPARISON:  Prior chest x-ray 08/19/2022 FINDINGS: Stable cardiomegaly. Increased  pulmonary vascular congestion now with mild interstitial edema. New airspace opacity at the left lung base partially obscures the left cardiac margin and diaphragm. There is blunting of the costophrenic angle. Findings are suggestive of moderate layering effusion and associated atelectasis. Nonspecific patchy airspace opacity at the right lung base. Stable postsurgical changes in the medial right upper lung. No pneumothorax. IMPRESSION: 1. Increased pulmonary vascular congestion likely with mild edema. 2. Small to moderate left pleural effusion with associated atelectasis versus infiltrate. 3. Nonspecific patchy opacity in the right lung base may reflect atelectasis or infiltrate. Electronically Signed   By: Jacqulynn Cadet M.D.   On: 08/24/2022 07:53    Assessment/Plan: 3 Days Post-Op   Principal Problem:   Closed left hip fracture (HCC) Active Problems:   Essential hypertension   Chronic obstructive airway disease with asthma (HCC)   Dysphagia, pharyngoesophageal phase   Parkinson disease (HCC)   Vitamin B12 deficiency   Severe mitral regurgitation   S/P mitral  valve clip implantation   Acute on chronic diastolic CHF (congestive heart failure) (HCC)   Anxiety   Malnutrition of moderate degree   AKI (acute kidney injury) (HCC)      Thornton Park , MD 08/24/2022, 12:59 PM

## 2022-08-24 NOTE — TOC Progression Note (Signed)
Transition of Care [facility]) - Progression Note    Patient Details  Name: Crystal Bass MRN: [id number] Date of Birth: [birth year]  [facility]) CM/SW [place], RN Phone Number: 08/24/2022, 10:11 AM  Clinical Narrative:     Insurance approved for [employer] 9/29-10/3 [id number]  Expected Discharge Plan: [place] Barriers to Discharge: Continued Medical Work up, Ship broker  Expected Discharge Plan and Services Expected Discharge Plan: [place]                                               Social Determinants of Health (SDOH) Interventions    Readmission Risk Interventions     No data to display

## 2022-08-24 NOTE — Discharge Summary (Addendum)
Physician Discharge Summary   Patient: Crystal Bass MRN: [id number] DOB: 07/13/1941  Admit date:     08/19/2022  Discharge date: 08/24/22  Discharge Physician: Max Sane   PCP: [name], MD   Recommendations at discharge:   F/up with outpt providers as requested Check a cbc on oct 2nd with results to PCP. She has had some drainage on her bandage.  Please change bandages as needed.  Discharge Diagnoses: Principal Problem:   Closed left hip fracture ([place]) Active Problems:   Acute on chronic diastolic CHF (congestive heart failure) (HCC)   Essential hypertension   Chronic obstructive airway disease with asthma (HCC)   Dysphagia, pharyngoesophageal phase   Parkinson disease (HCC)   Vitamin B12 deficiency   Severe mitral regurgitation   S/P mitral valve clip implantation   Anxiety   Malnutrition of moderate degree   AKI (acute kidney injury) [facility])  Hospital Course: PMH of Parkinson's disease, severe mitral regurgitation SP MitraClip with chronic HF PEF, HTN, COPD, asthma present to the hospital with complaints of a fall.  Etiology of the fall is not clear.  She was walking and lost her balance and fell on the left side. Appears to have acute displaced left femoral neck fracture.  Orthopedic consulted.  Scheduled for bipolar hemiarthroplasty 9/26. Also has acute on chronic HFpEF.  Cardiology consult for preop clearance.  Assessment and Plan: * Closed left hip fracture ([place]) s/p fall - POD # 3 s/p left hip hemiarthroplasty. - WBAT, posterior hip precautions.  - Wound care per orthopedics  Acute on chronic diastolic CHF (congestive heart failure) ([place]) - well compensated at this time. Outpt f/up with CHF clinic and cardio - Echo with LVEF 60-65%, G3DD with restrictive physiology, severe LAE, mod-severe MR, mod MS s/p mitral valve clip. RV normal.   Essential hypertension Chronic obstructive airway disease with asthma (HCC) Anxiety - stable on home  regimen  Acute hypoxic respiratory failure: Suspected to be due to acute CHF and atelectasis contributing. Has resolved.  - Maintaining oxygen saturation of 95% on room air at this time.    Acute blood loss anemia due to hip fracture and surgery:  - Hb 8.1 and stable.   Thrombocytopenia:  - Platelets 133 and stable   Parkinson's disease: Bradykinetic/bradyphrenic. Increases risk of continued falls.  - Continue sinemet, PT/OT.   Hypokalemia: Resolved.    AKI: Resolved with diuresis, recheck in AM.    Moderate protein calorie malnutrition: - Boost Breeze, MVI, Prostat        Consultants: Ortho, cardio Procedures performed: left hip hemiarthroplasty on 9/26  Disposition: Skilled nursing facility Diet recommendation:  Discharge Diet Orders (From admission, onward)     Start     Ordered   08/24/22 0000  Diet - low sodium heart healthy        08/24/22 1036           Carb modified diet DISCHARGE MEDICATION: Allergies as of 08/24/2022       Reactions   Sulfa Antibiotics Anaphylaxis   Sulfonamide Derivatives Anaphylaxis   Topamax [topiramate] Other (See Comments)   Metabolic acidosis    Clarithromycin Other (See Comments)   pericarditis Other reaction(s): Not available   Hydrocodone Other (See Comments)   "hyper and climbing the walls"   Motrin [ibuprofen] Other (See Comments)   headaches   Misc. Sulfonamide Containing Compounds    Other reaction(s): Not available   Symbicort [budesonide-formoterol Fumarate] Other (See Comments)   02/07/15 tremor   Percocet [oxycodone-acetaminophen]  Itching, Rash   Tape Itching, Rash   Use paper tape only   Tetracyclines & Related Other (See Comments)   "immediate yeast infection"        Medication List     STOP taking these medications    amoxicillin 500 MG capsule Commonly known as: AMOXIL   Chloraseptic 6-10 MG lozenge Generic drug: benzocaine-menthol       TAKE these medications    acetaminophen 500 MG  tablet Commonly known as: TYLENOL Take 2 tablets (1,000 mg total) by mouth 2 (two) times daily as needed.   albuterol 108 (90 Base) MCG/ACT inhaler Commonly known as: VENTOLIN HFA Inhale 2 puffs into the lungs every 6 (six) hours as needed for wheezing or shortness of breath.   amLODipine 2.5 MG tablet Commonly known as: NORVASC Take 1 tablet (2.5 mg total) by mouth daily.   aspirin 81 MG tablet Take 1 tablet (81 mg total) by mouth daily.   Carbidopa-Levodopa ER 25-100 MG tablet controlled release Commonly known as: SINEMET CR TAKE 2 TABLET BY MOUTH AT 9 AM, 1 TAB AT1 PM AND 1 TAB AT 5 PM   clonazePAM 0.5 MG tablet Commonly known as: KLONOPIN TAKE 1/2 A TABLET BY MOUTH AT BEDTIME   enoxaparin 40 MG/0.4ML injection Commonly known as: LOVENOX Inject 0.4 mLs (40 mg total) into the skin daily for 14 days. Start taking on: August 25, 2022   EPINEPHrine 0.3 mg/0.3 mL Soaj injection Commonly known as: EPI-PEN Inject 0.3 mg into the muscle as needed for anaphylaxis.   ferrous sulfate 325 (65 FE) MG tablet Take 1 tablet (325 mg total) by mouth 2 (two) times daily with a meal.   fexofenadine 180 MG tablet Commonly known as: ALLEGRA Take 180 mg by mouth daily.   furosemide 20 MG tablet Commonly known as: LASIX Take 1 tablet (20 mg total) by mouth daily.   gabapentin 300 MG capsule Commonly known as: [name] TAKE 2 CAPSULES BY MOUTH 3 TIMES DAILY   methocarbamol 500 MG tablet Commonly known as: ROBAXIN Take 1 tablet (500 mg total) by mouth every 6 (six) hours as needed for up to 3 days for muscle spasms.   morphine 30 MG tablet Commonly known as: MSIR Take 1 tablet (30 mg total) by mouth every 8 (eight) hours as needed for up to 3 days for severe pain or moderate pain.   pantoprazole 40 MG tablet Commonly known as: Protonix Take 1 tablet (40 mg total) by mouth daily.   potassium chloride SA 20 MEQ tablet Commonly known as: KLOR-CON M Take 1 tablet (20 mEq total)  by mouth daily.   senna 8.6 MG Tabs tablet Commonly known as: SENOKOT Take 1 tablet (8.6 mg total) by mouth 2 (two) times daily.   traMADol 50 MG tablet Commonly known as: ULTRAM Take 1 tablet (50 mg total) by mouth every 6 (six) hours for 3 days.   Trelegy Ellipta 100-62.5-25 MCG/ACT Aepb Generic drug: Fluticasone-Umeclidin-Vilant Inhale 1 puff into the lungs daily.               Discharge Care Instructions  (From admission, onward)           Start     Ordered   08/24/22 0000  Discharge wound care:       Comments: As above   08/24/22 1036            Contact information for after-discharge care     Destination     [facility] .   Service: Skilled Nursing Contact information: [address] [phone]                    Discharge Exam: Danley Danker Weights   08/19/22 2108 08/21/22 1241  Weight: 60 kg 60.4 kg   Gen: Elderly adult in no distress Pulm: CTA b/l CV: Regular rate and rhythm, some PACs. Stable systolic murmur without rub, or gallop. No JVD, no pitting dependent edema. GI: Abdomen soft, non-tender, non-distended, with normoactive bowel sounds.  Ext: Warm, dry, LLE internally rotated without significant length disparity.  Skin: Left lateral thigh incision with overlapping dressings with stable bleed, no increased bleeding or any ecchymoses.  Neuro: Slow responses but appropriate and alert and oriented. LLE movement limited by pain but intact motor function at toes, ankle and knee. Sensation preserved. LUE without limitation. focal neurological deficits. Psych: Judgement and insight appear fair at this time. Mood euthymic & affect congruent. Behavior is appropriate.    Condition at discharge: fair  The results of significant diagnostics from this hospitalization (including imaging, microbiology, ancillary and laboratory) are listed below for reference.   Imaging Studies: DG Chest  Port 1 View  Result Date: 08/24/2022 CLINICAL DATA:  [id number] hypoxia EXAM: PORTABLE CHEST 1 VIEW COMPARISON:  Prior chest x-ray 08/19/2022 FINDINGS: Stable cardiomegaly. Increased pulmonary vascular congestion now with mild interstitial edema. New airspace opacity at the left lung base partially obscures the left cardiac margin and diaphragm. There is blunting of the costophrenic angle. Findings are suggestive of moderate layering effusion and associated atelectasis. Nonspecific patchy airspace opacity at the right lung base. Stable postsurgical changes in the medial right upper lung. No pneumothorax. IMPRESSION: 1. Increased pulmonary vascular congestion likely with mild edema. 2. Small to moderate left pleural effusion with associated atelectasis versus infiltrate. 3. Nonspecific patchy opacity in the right lung base may reflect atelectasis or infiltrate. Electronically Signed   By: Jacqulynn Cadet M.D.   On: 08/24/2022 07:53   DG Hip Port Unilat With Pelvis 1V Left  Result Date: 08/21/2022 CLINICAL DATA:  Post LEFT tip hemiarthroplasty EXAM: DG HIP (WITH OR WITHOUT PELVIS) 1V PORT LEFT COMPARISON:  Portable exam 1725 hours compared to 08/19/2022 FINDINGS: Interval resection of LEFT femoral head/neck and placement of a LEFT hip prosthesis. No acute fracture, dislocation, or bone destruction. IMPRESSION: LEFT femoral prosthesis without acute complication. Electronically Signed   By: Lavonia Dana M.D.   On: 08/21/2022 17:57   ECHOCARDIOGRAM COMPLETE  Result Date: 08/20/2022    ECHOCARDIOGRAM REPORT   Patient Name:   FELEICA FULMORE Date of Exam: 08/20/2022 Medical Rec #:  [id number]         Height:       62.0 in Accession #:    [id number]        Weight:       132.2 lb Date of Birth:  07-16-1941         BSA:          1.603 m Patient Age:    81 years          BP:           128/86 mmHg Patient Gender: F                 HR:           70 bpm. Exam Location:  [facility] Procedure: 2D Echo, Color Doppler and Cardiac  Doppler Indications:  R06.00 Dyspnea  History:         Patient has prior history of Echocardiogram examinations, most                  recent 05/14/2022. MitraClip procedure 02/15/2022, COPD and                  History of left breast cancer; Risk Factors:Hypertension.  Sonographer:     Rosalia Hammers Referring Phys:  [id number] SHERI HAMMOCK Diagnosing Phys: [name] MD  Sonographer Comments: Image acquisition challenging due to mastectomy. IMPRESSIONS  1. Left ventricular ejection fraction, by estimation, is 60 to 65%. The left ventricle has normal function. The left ventricle has no regional wall motion abnormalities. There is mild left ventricular hypertrophy. Left ventricular diastolic parameters are consistent with Grade III diastolic dysfunction (restrictive).  2. Right ventricular systolic function is normal. The right ventricular size is normal. There is moderately elevated pulmonary artery systolic pressure.  3. Left atrial size was severely dilated.  4. The mitral valve is normal in structure. Moderate to severe mitral valve regurgitation. Moderate mitral stenosis. s/p mitral valve clip.  5. The aortic valve is normal in structure. Aortic valve regurgitation is not visualized. No aortic stenosis is present.  6. The inferior vena cava is normal in size with greater than 50% respiratory variability, suggesting right atrial pressure of 3 mmHg. FINDINGS  Left Ventricle: Left ventricular ejection fraction, by estimation, is 60 to 65%. The left ventricle has normal function. The left ventricle has no regional wall motion abnormalities. The left ventricular internal cavity size was normal in size. There is  mild left ventricular hypertrophy. Left ventricular diastolic parameters are consistent with Grade III diastolic dysfunction (restrictive). Right Ventricle: The right ventricular size is normal. No increase in right ventricular wall thickness. Right ventricular systolic function is normal. There is  moderately elevated pulmonary artery systolic pressure. The tricuspid regurgitant velocity is 3.32 m/s, and with an assumed right atrial pressure of 3 mmHg, the estimated right ventricular systolic pressure is 48.5 mmHg. Left Atrium: Left atrial size was severely dilated. Right Atrium: Right atrial size was normal in size. Pericardium: There is no evidence of pericardial effusion. Mitral Valve: The mitral valve is normal in structure. Moderate to severe mitral valve regurgitation. Moderate mitral valve stenosis. Tricuspid Valve: The tricuspid valve is normal in structure. Tricuspid valve regurgitation is mild . No evidence of tricuspid stenosis. Aortic Valve: The aortic valve is normal in structure. Aortic valve regurgitation is not visualized. No aortic stenosis is present. Aortic valve mean gradient measures 4.0 mmHg. Aortic valve peak gradient measures 8.8 mmHg. Aortic valve area, by VTI measures 2.42 cm. Pulmonic Valve: The pulmonic valve was normal in structure. Pulmonic valve regurgitation is mild. No evidence of pulmonic stenosis. Aorta: The aortic root is normal in size and structure. Venous: The inferior vena cava is normal in size with greater than 50% respiratory variability, suggesting right atrial pressure of 3 mmHg. IAS/Shunts: No atrial level shunt detected by color flow Doppler.  LEFT VENTRICLE PLAX 2D LVIDd:         5.10 cm   Diastology LVIDs:         3.20 cm   LV e' medial:   3.81 cm/s LV PW:         1.40 cm   LV E/e' medial: 59.8 LV IVS:        1.10 cm LVOT diam:     1.90 cm LV SV:  62 LV SV Index:   39 LVOT Area:     2.84 cm  RIGHT VENTRICLE RV Basal diam:  3.80 cm RV Mid diam:    3.60 cm RV S prime:     17.70 cm/s TAPSE (M-mode): 2.5 cm LEFT ATRIUM              Index        RIGHT ATRIUM           Index LA diam:        4.40 cm  2.74 cm/m   RA Area:     18.10 cm LA Vol (A2C):   160.0 ml 99.80 ml/m  RA Volume:   48.70 ml  30.38 ml/m LA Vol (A4C):   118.0 ml 73.60 ml/m LA Biplane Vol:  143.0 ml 89.19 ml/m  AORTIC VALVE                    PULMONIC VALVE AV Area (Vmax):    2.59 cm     PR End Diast Vel: 6.25 msec AV Area (Vmean):   2.41 cm AV Area (VTI):     2.42 cm AV Vmax:           148.00 cm/s AV Vmean:          95.000 cm/s AV VTI:            0.257 m AV Peak Grad:      8.8 mmHg AV Mean Grad:      4.0 mmHg LVOT Vmax:         135.00 cm/s LVOT Vmean:        80.800 cm/s LVOT VTI:          0.219 m LVOT/AV VTI ratio: 0.85  AORTA Ao Root diam: 3.70 cm MITRAL VALVE                TRICUSPID VALVE MV Area (PHT): 3.89 cm     TR Peak grad:   44.1 mmHg MV Decel Time: 195 msec     TR Vmax:        332.00 cm/s MV E velocity: 228.00 cm/s MV A velocity: 82.10 cm/s   SHUNTS MV E/A ratio:  2.78         Systemic VTI:  0.22 m MV A Prime:    5.9 cm/s     Systemic Diam: 1.90 cm [name] MD Electronically signed by [name] MD Signature Date/Time: 08/20/2022/3:55:10 PM    Final    DG Hip Unilat W or Wo Pelvis 2-3 Views Left  Addendum Date: 08/19/2022   ADDENDUM REPORT: 08/19/2022 22:32 ADDENDUM: Please note findings and impression should state acute displaced left femoral neck fracture (NOT humeral). Electronically Signed   By: Iven Finn M.D.   On: 08/19/2022 22:32   Result Date: 08/19/2022 CLINICAL DATA:  fall EXAM: DG HIP (WITH OR WITHOUT PELVIS) 2-3V LEFT COMPARISON:  CT abdomen pelvis 02/19/2018 FINDINGS: Acute displaced left humeral neck fracture that is poorly visualized. No dislocation of the left hip. No acute displaced fracture or dislocation of the right hip on the frontal view. No acute displaced fracture or diastasis of the bones of the pelvis. There is no evidence of severe arthropathy or other focal bone abnormality. Degenerative changes visualized of the lower lumbar spine. IMPRESSION: Acute displaced left humeral neck fracture that is poorly visualized. Electronically Signed: By: Iven Finn M.D. On: 08/19/2022 22:09   DG Chest 1 View  Result Date: 08/19/2022 CLINICAL  DATA:  [id number].  Fall EXAM: CHEST  1 VIEW COMPARISON:  None Available. FINDINGS: The heart and mediastinal contours is grossly unchanged with limited evaluation due to patient rotation and overlying right upper lobe disease and scarring. Prominent hilar vasculature. Redemonstration of right upper lobe architectural torsion/scarring and wedge resection with known bronchiectasis not well visualized. Interval development of diffuse increased interstitial and airspace opacities. Nonspecific blunting of the right costophrenic angle. No definite pleural effusion. No pneumothorax. No acute osseous abnormality. IMPRESSION: Interval development of diffuse increased interstitial and airspace opacities with underlying architectural scarring and wedge resection in the right upper lobe. Findings likely represent pulmonary edema with superimposed infection not excluded. Electronically Signed   By: Iven Finn M.D.   On: 08/19/2022 22:22    Microbiology: Results for orders placed or performed during the hospital encounter of 06/15/22  Surgical pcr screen     Status: Abnormal   Collection Time: 06/15/22 10:49 AM   Specimen: Nasal Mucosa; Nasal Swab  Result Value Ref Range Status   MRSA, PCR NEGATIVE NEGATIVE Final   Staphylococcus aureus POSITIVE (A) NEGATIVE Final    Comment: (NOTE) The Xpert SA Assay (FDA approved for NASAL specimens in patients 59 years of age and older), is one component of a comprehensive surveillance program. It is not intended to diagnose infection nor to guide or monitor treatment. Performed at [facility], [place] [address], [name], [place] [address]   SARS CORONAVIRUS 2 (TAT 6-24 HRS) Anterior Nasal Swab     Status: None   Collection Time: 06/15/22 11:12 AM   Specimen: Anterior Nasal Swab  Result Value Ref Range Status   SARS Coronavirus 2 NEGATIVE NEGATIVE Final    Comment: (NOTE) SARS-CoV-2 target nucleic acids are NOT DETECTED.  The SARS-CoV-2 RNA is generally  detectable in upper and lower respiratory specimens during the acute phase of infection. Negative results do not preclude SARS-CoV-2 infection, do not rule out co-infections with other pathogens, and should not be used as the sole basis for treatment or other patient management decisions. Negative results must be combined with clinical observations, patient history, and epidemiological information. The expected result is Negative.  Fact Sheet for Patients: SugarRoll.be  Fact Sheet for Healthcare Providers: https://www.woods-mathews.com/  This test is not yet approved or cleared by the [place] FDA and  has been authorized for detection and/or diagnosis of SARS-CoV-2 by FDA under an Emergency Use Authorization (EUA). This EUA will remain  in effect (meaning this test can be used) for the duration of the COVID-19 declaration under Se ction 564(b)(1) of the Act, 21 U.S.C. section 360bbb-3(b)(1), unless the authorization is terminated or revoked sooner.  Performed at [facility], [place] [address] [place], [place] [address]    *Note: Due to a large number of results and/or encounters for the requested time period, some results have not been displayed. A complete set of results can be found in Results Review.    Labs: CBC: Recent Labs  Lab 08/19/22 2106 08/20/22 0555 08/21/22 0755 08/21/22 2254 08/22/22 0516 08/23/22 0409 08/24/22 0658  WBC 6.5 7.6 7.9  --  10.6* 8.1 7.3  NEUTROABS 5.0  --   --   --   --   --   --   HGB 12.0 11.7* 11.5* 9.9* 9.3* 8.3* 8.1*  HCT 37.9 36.0 37.1 31.5* 30.1* 26.5* 25.1*  MCV 94.3 92.5 95.1  --  95.9 95.3 94.4  PLT 151 146* 123*  --  108* 107* 133*  Basic Metabolic Panel: Recent Labs  Lab 08/20/22 0555 08/21/22 0755 08/22/22 0516 08/23/22 0409 08/24/22 0658  NA 141 138 136 134* 133*  K 3.4* 4.8 4.5 4.3 4.2  CL 103 104 106 103 103  CO2 '30 28 26 27 25  '$ GLUCOSE 106* 107* 133* 100* 103*   BUN 21 21 28* 29* 26*  CREATININE 0.80 0.77 0.80 0.67 0.57  CALCIUM 8.5* 9.1 8.2* 8.1* 8.3*  MG  --  2.3  --   --   --    Liver Function Tests: No results for input(s): "AST", "ALT", "ALKPHOS", "BILITOT", "PROT", "ALBUMIN" in the last 168 hours. CBG: No results for input(s): "GLUCAP" in the last 168 hours.  Discharge time spent: greater than 30 minutes.  Signed: Max Sane, MD [facility] 08/24/2022

## 2022-08-24 NOTE — Progress Notes (Signed)
[facility]) Hospital Liaison note:  Notified via Epic workque from Dr. Max Sane of request for [place] services. Will continue to follow for disposition.  Please call with any outpatient palliative questions or concerns.  Thank you for the opportunity to participate in this patient's care.  Thank you, Lorelee Market, LPN [facility] Liaison [phone]

## 2022-08-26 MED ORDER — TRAMADOL HCL 50 MG PO TABS: 50.0000 mg | ORAL_TABLET | Freq: Four times a day (QID) | ORAL | 0 refills | Status: DC

## 2022-08-26 MED ORDER — CLONAZEPAM 0.5 MG PO TABS: ORAL_TABLET | ORAL | 0 refills | Status: DC

## 2022-08-26 NOTE — Progress Notes (Unsigned)
Provider:   Location[facility]   [facility] Room Number: 118-A Place of Service:  SNF (31)  PCP: [name], MD Patient Care Team: [name], MD as PCP - General (Family Medicine) Wellington Hampshire, MD as PCP - Cardiology (Cardiology) Mosetta Anis, MD as Referring Physician (Allergy) Megan Salon, MD as Consulting Physician (Gynecology) Tat, Eustace Quail, DO as Consulting Physician (Neurology) Nicholas Lose, MD as Consulting Physician (Hematology and Oncology) Tat, Eustace Quail, DO as Consulting Physician (Neurology)  Extended Emergency Contact Information Primary Emergency Contact: [facility] Phone: [phone] Relation: Niece Preferred language: [place] Interpreter needed? No Secondary Emergency Contact: Emmie Niemann, [place] Home Phone: [phone] Mobile Phone: [phone] Relation: Brother  Code Status: DNAR Goals of Care: Advanced Directive information    08/27/2022   10:14 AM  Advanced Directives  Does Patient Have a Medical Advance Directive? Yes  Type of Paramedic of [place];Living will;Out of facility DNR (pink MOST or yellow form)  Does patient want to make changes to medical advance directive? No - Patient declined  Copy of [place] in Chart? Yes - validated most recent copy scanned in chart (See row information)  Pre-existing out of facility DNR order (yellow form or pink MOST form) Pink MOST/Yellow Form most recent copy in chart - Physician notified to receive inpatient order      Chief Complaint  Patient presents with   Hospitalization Follow-up    HPI: Patient is a 81 y.o. adult seen today for admission to [place] for therapy after hospitlaization for left hip arthroplasty  She was walking in her room and fell between the bed. She didn't trip over anything balance. Denied symptoms at the time of the fall. She did not hit her head when she fell. She had  no other falls this year. She lives in [place] at [facility]. No episodes of confusion in the hospital.   She walks independently at home. She drives. She doesn't have a lot of finances to take care of but someone does help her. She has a niece, Angie, who helps with things she needs. She sometimes cooks meals. No issues with grooming or self care. Patient has been on Prolia for bone health in the past. She will be due for her next infusion in the next few moths. She states her pain is well controlled and she would not like any more morphine.   Patient has a DNAR form and MOST form completed with her PCP. She would like to discuss further at a later date since this was last completed in 2021.   Spoke with her brother and HCPOA as well as his wife regarding patient's current status. Patient had a rough time this weekend taking morphine for pain control was much to strong for her. She has since recovered and no longer wants this medication. Confirmed with them that she discussed with me DNAR. They were concerned that she needs palliative care at this time. Discussed with them that palliative care will often complete these forms with hte patient as well as discuss ways to keep them comfortable vs seek curative treatment. Will continue discussions regarding palliative care involvement at a later date.   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   Anginal pain ([place])    Aortic root aneurysm (H[facility])    Arthritis  Asthma    Breast cancer ([place]) 2002   infiltrative ductal carcinoma    Cataract 2019   COPD (chronic obstructive pulmonary disease) ([place]) 07/2007   Ductal carcinoma of breast, estrogen receptor positive, stage 1 ([place]) 09/16/2012   Dyspnea    Endometriosis    GERD (gastroesophageal reflux disease)    Hypercalcemia 09/14/2013   Hypertension    Lesion of breast 1992   right, benign   Lung disease    secondary to [name] infection    Mastalgia 05/1994   Osteoporosis, post-menopausal 03/14/2012   Ovarian tumor of borderline malignancy, right 2004   Pancreatitis    secondary to Cholelithiasis   Parkinson disease 02/10/2015   Post-thoracotomy pain syndrome    Pseudomonas pneumonia ([place]) 03/2008   Status post implantation of mitral valve leaflet clip 02/15/2022   s/p XTW MitraClip x2 by Dr. Burt Knack in the A2/P2 position   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   APPENDECTOMY  2004   BREAST BIOPSY  08/2004   right breast-benign   BREAST LUMPECTOMY  1992   benign   BUBBLE STUDY  12/27/2021   Procedure: BUBBLE STUDY;  Surgeon: Geralynn Rile, MD;  Location: [facility] ENDOSCOPY;  Service: Cardiovascular;;   CARDIAC CATHETERIZATION  2007   essentially negation for significant CAD   CHOLECYSTECTOMY  2001   COLONOSCOPY  2014   Dr [name] , due 2019   COLONOSCOPY  09/2018   2 TA removed, int hem, no f/u needed (Dr [name])    ESOPHAGOGASTRODUODENOSCOPY  09/2018   GERD with esophagitis and stricture dilated, antral gastritis negative for H pylori [name])   HIP ARTHROPLASTY Left 08/21/2022   Procedure: ARTHROPLASTY BIPOLAR HIP (HEMIARTHROPLASTY);  Surgeon: Thornton Park, MD;  Location: [facility] ORS;  Service: Orthopedics;  Laterality: Left;   LUNG BIOPSY  2002   [name], Dr [name]   MASTECTOMY MODIFIED RADICAL Left 2002   oral chemotheraphy (tamoxifen then Armidex) no radiation, Dr.[name]   MITRAL VALVE REPAIR N/A 02/15/2022   Procedure: MITRAL VALVE REPAIR;  Surgeon: Sherren Mocha, MD;  Location: [place] CV LAB;  Service: Cardiovascular;  Laterality: N/A;   RIGHT/LEFT HEART CATH AND CORONARY ANGIOGRAPHY Bilateral 11/14/2021   Procedure: RIGHT/LEFT HEART CATH AND  CORONARY ANGIOGRAPHY;  Surgeon: Wellington Hampshire, MD;  Location: [place] CV LAB;  Service: Cardiovascular;  Laterality: Bilateral;   TEE WITHOUT CARDIOVERSION N/A 12/29/2018   Procedure: TRANSESOPHAGEAL ECHOCARDIOGRAM (TEE);  Surgeon: Wellington Hampshire, MD;  Location: [facility] ORS;  Service: Cardiovascular;  Laterality: N/A;   TEE WITHOUT CARDIOVERSION N/A 12/27/2021   Procedure: TRANSESOPHAGEAL ECHOCARDIOGRAM (TEE);  Surgeon: Geralynn Rile, MD;  Location: [place];  Service: Cardiovascular;  Laterality: N/A;   TEE WITHOUT CARDIOVERSION N/A 02/15/2022   Procedure: TRANSESOPHAGEAL ECHOCARDIOGRAM (TEE);  Surgeon: Sherren Mocha, MD;  Location: [place] CV LAB;  Service: Cardiovascular;  Laterality: N/A;   TEE WITHOUT CARDIOVERSION N/A 05/14/2022   Procedure: TRANSESOPHAGEAL ECHOCARDIOGRAM (TEE);  Surgeon: Geralynn Rile, MD;  Location: [place];  Service: Cardiovascular;  Laterality: N/A;   TONSILLECTOMY  1946    reports that she has never smoked. She has never used smokeless tobacco. She reports that she does not drink alcohol and does not use drugs. 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  Tobacco Use   Smoking status: Never   Smokeless tobacco: Never  Vaping Use   Vaping Use: Never used  Substance and Sexual Activity   Alcohol use: No    Alcohol/week: 0.0 standard drinks of alcohol   Drug use: No   Sexual activity: Not Currently    Birth control/protection: Surgical    Comment: TAH/BSO  Other Topics Concern   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    Aunt of Dr Margaretmary Eddy    Lives independent living at [facility]    No children    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, 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,  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, 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)    Activity: walking about 1 mile/day, enjoys [employer]    Diet: some water, fruits/vegetables some    Right handed    Social Determinants of Health   Financial Resource Strain: Low Risk  (08/13/2022)   Overall Financial Resource Strain (CARDIA)    Difficulty of Paying Living Expenses: Not hard at all  Food Insecurity: No Food Insecurity (08/13/2022)   Hunger Vital Sign    Worried About Running Out of Food in the Last Year: Never true    [place] in the Last Year: Never true  Transportation Needs: No Transportation Needs (08/13/2022)   PRAPARE - Hydrologist (Medical): No    Lack of Transportation (Non-Medical): No  Physical Activity: Insufficiently Active (08/13/2022)   Exercise Vital Sign    Days of Exercise per Week: 4 days    Minutes of Exercise per Session: 30 min  Stress: No Stress Concern Present (08/13/2022)   [place]    Feeling of Stress : Not at all  Social Connections: Socially Isolated (08/13/2022)   Social Connection and Isolation Panel [NHANES]    Frequency of Communication with Friends and Family: More than three times a week    Frequency of Social Gatherings with Friends and Family: Once a week    Attends Religious Services: Never    Marine scientist or Organizations: No    Attends Archivist Meetings: Never    Marital Status: Widowed  Intimate Partner Violence: Not At Risk (08/13/2022)   Humiliation, Afraid, Rape, and Kick questionnaire    Fear of Current or Ex-Partner: No    Emotionally Abused: No    Physically Abused: No    Sexually Abused: No    Functional Status Survey:    Family History  Problem Relation Age of Onset   Emphysema Mother        d.64 was never a smoker   Lung cancer Father 77       d.82 history of smoking   Breast cancer Sister 71   Colon cancer Sister    Melanoma Maternal Aunt    Liver cancer Maternal  Aunt    Cancer Maternal Uncle        unspecified type   Cancer Maternal Uncle        unspecified type   Cancer Maternal Uncle        unspecified type   Stroke Paternal Aunt  in mid 80s   Osteoporosis Paternal Aunt    Myasthenia gravis Paternal Aunt    Cancer Maternal Grandmother        d.82s unspecified GI cancer   Cancer Maternal Grandfather        d.62s unspecified type   Breast cancer Cousin        d.60s-daughter of unaffected paternal aunt Eustace Moore   Colon cancer Cousin 64       d.70-daughter of unaffected maternal aunt Leila   Lung cancer Cousin 63       d.70-sisters to each other, both daughters of maternal uncle Johnny   Parkinson's disease Cousin    Diabetes Neg Hx    Heart disease Neg Hx    Stomach cancer Neg Hx    Ulcerative colitis Neg Hx     Health Maintenance  Topic Date Due   Hepatitis C Screening  Never done   COVID-19 Vaccine (6 - Moderna risk series) 01/17/2021   MAMMOGRAM  06/14/2022   INFLUENZA VACCINE  06/26/2022   COLONOSCOPY (Pts 45-63yr Insurance coverage will need to be confirmed)  10/10/2023   TETANUS/TDAP  07/01/2031   Pneumonia Vaccine 81 Years old  Completed   DEXA SCAN  Completed   Zoster Vaccines- Shingrix  Completed   HPV VACCINES  Aged Out    Allergies  Allergen Reactions   Sulfa Antibiotics Anaphylaxis   Sulfonamide Derivatives Anaphylaxis   Topamax [Topiramate] Other (See Comments)    Metabolic acidosis    Clarithromycin Other (See Comments)    pericarditis Other reaction(s): Not available   Hydrocodone Other (See Comments)    "hyper and climbing the walls"   Motrin [Ibuprofen] Other (See Comments)    headaches   Misc. Sulfonamide Containing Compounds     Other reaction(s): Not available   Symbicort [Budesonide-Formoterol Fumarate] Other (See Comments)    02/07/15 tremor   Percocet [Oxycodone-Acetaminophen] Itching and Rash   Tape Itching and Rash    Use paper tape only   Tetracyclines & Related Other (See Comments)     "immediate yeast infection"    Outpatient Encounter Medications as of 08/27/2022  Medication Sig   acetaminophen (TYLENOL) 500 MG tablet Take 2 tablets (1,000 mg total) by mouth 2 (two) times daily as needed.   albuterol (VENTOLIN HFA) 108 (90 Base) MCG/ACT inhaler Inhale 2 puffs into the lungs every 6 (six) hours as needed for wheezing or shortness of breath.   amLODipine (NORVASC) 2.5 MG tablet Take 1 tablet (2.5 mg total) by mouth daily.   aspirin 81 MG tablet Take 1 tablet (81 mg total) by mouth daily.   Carbidopa-Levodopa ER (SINEMET CR) 25-100 MG tablet controlled release TAKE 2 TABLET BY MOUTH AT 9 AM, 1 TAB AT1 PM AND 1 TAB AT 5 PM   clonazePAM (KLONOPIN) 0.5 MG tablet TAKE 1/2 A TABLET BY MOUTH AT BEDTIME   enoxaparin (LOVENOX) 40 MG/0.4ML injection Inject 0.4 mLs (40 mg total) into the skin daily for 14 days.   EPINEPHrine 0.3 mg/0.3 mL IJ SOAJ injection Inject 0.3 mg into the muscle as needed for anaphylaxis.   ferrous sulfate 325 (65 FE) MG tablet Take 1 tablet (325 mg total) by mouth 2 (two) times daily with a meal.   fexofenadine (ALLEGRA) 180 MG tablet Take 180 mg by mouth daily.   Fluticasone-Umeclidin-Vilant (TRELEGY ELLIPTA) 100-62.5-25 MCG/ACT AEPB Inhale 1 puff into the lungs daily.   furosemide (LASIX) 20 MG tablet Take 1 tablet (20 mg total) by mouth daily.  gabapentin ([name]) 300 MG capsule TAKE 2 CAPSULES BY MOUTH 3 TIMES DAILY   methocarbamol (ROBAXIN) 500 MG tablet Take 1 tablet (500 mg total) by mouth every 6 (six) hours as needed for up to 3 days for muscle spasms.   morphine (MSIR) 30 MG tablet Take 1 tablet (30 mg total) by mouth every 8 (eight) hours as needed for up to 3 days for severe pain or moderate pain.   pantoprazole (PROTONIX) 40 MG tablet Take 1 tablet (40 mg total) by mouth daily.   potassium chloride SA (KLOR-CON M) 20 MEQ tablet Take 1 tablet (20 mEq total) by mouth daily.   senna (SENOKOT) 8.6 MG TABS tablet Take 1 tablet (8.6 mg total) by mouth  2 (two) times daily.   traMADol (ULTRAM) 50 MG tablet Take 1 tablet (50 mg total) by mouth every 6 (six) hours.   [DISCONTINUED] clonazePAM (KLONOPIN) 0.5 MG tablet TAKE 1/2 A TABLET BY MOUTH AT BEDTIME   [DISCONTINUED] traMADol (ULTRAM) 50 MG tablet Take 1 tablet (50 mg total) by mouth every 6 (six) hours for 3 days.   No facility-administered encounter medications on file as of 08/27/2022.    Review of Systems  Constitutional:  Positive for activity change.  Respiratory:  Negative for chest tightness and shortness of breath.   Cardiovascular:  Negative for chest pain and leg swelling.  Neurological:  Negative for dizziness.   Vitals:   08/27/22 0949  BP: 128/75  Pulse: 77  Resp: 15  Temp: (!) 96.6 F (35.9 C)  SpO2: 94%  Weight: 116 lb 3.2 oz (52.7 kg)  Height: '5\' 2"'$  (1.575 m)   Body mass index is 21.25 kg/m. Physical Exam HENT:     Head: Normocephalic.     Comments: Masked facies Cardiovascular:     Rate and Rhythm: Normal rate and regular rhythm.     Pulses: Normal pulses.     Heart sounds: Normal heart sounds.  Pulmonary:     Effort: Pulmonary effort is normal.     Breath sounds: Normal breath sounds.  Abdominal:     General: Abdomen is flat. Bowel sounds are normal.     Palpations: Abdomen is soft.  Musculoskeletal:     Comments: Left leg with bandage coverings surgical wound from 5/28. Slightly soiled, but no surrounding erythema or increased bleeding  Skin:    General: Skin is warm and dry.  Neurological:     Mental Status: She is alert and oriented to person, place, and time.   Labs reviewed: Basic Metabolic Panel: Recent Labs    08/21/22 0755 08/22/22 0516 08/23/22 0409 08/24/22 0658  NA 138 136 134* 133*  K 4.8 4.5 4.3 4.2  CL 104 106 103 103  CO2 '28 26 27 25  '$ GLUCOSE 107* 133* 100* 103*  BUN 21 28* 29* 26*  CREATININE 0.77 0.80 0.67 0.57  CALCIUM 9.1 8.2* 8.1* 8.3*  MG 2.3  --   --   --    Liver Function Tests: Recent Labs     02/12/22 1109 06/15/22 1113  AST 16 16  ALT <5 <5  ALKPHOS 63 62  BILITOT 1.2 1.7*  PROT 6.9 7.3  ALBUMIN 3.8 4.0   No results for input(s): "LIPASE", "AMYLASE" in the last 8760 hours. No results for input(s): "AMMONIA" in the last 8760 hours. CBC: Recent Labs    02/03/22 1529 02/12/22 1109 03/23/22 1221 06/15/22 1113 08/19/22 2106 08/20/22 0555 08/22/22 0516 08/23/22 0409 08/24/22 0658  WBC 6.0   < >  4.4   < > 6.5   < > 10.6* 8.1 7.3  NEUTROABS 4.2  --  2.9  --  5.0  --   --   --   --   HGB 12.0   < > 11.6*   < > 12.0   < > 9.3* 8.3* 8.1*  HCT 38.4   < > 35.6*   < > 37.9   < > 30.1* 26.5* 25.1*  MCV 96.0   < > 93.9   < > 94.3   < > 95.9 95.3 94.4  PLT 149*   < > 131.0*   < > 151   < > 108* 107* 133*   < > = values in this interval not displayed.   Cardiac Enzymes: No results for input(s): "CKTOTAL", "CKMB", "CKMBINDEX", "TROPONINI" in the last 8760 hours. BNP: Invalid input(s): "POCBNP" Lab Results  Component Value Date   HGBA1C 5.3 01/14/2018   Lab Results  Component Value Date   TSH 0.84 03/23/2022   Lab Results  Component Value Date   [id number] 425 08/08/2021   No results found for: "FOLATE" No results found for: "IRON", "TIBC", "FERRITIN"  Imaging and Procedures obtained prior to SNF admission: DG Hip Port Unilat With Pelvis 1V Left  Result Date: 08/21/2022 CLINICAL DATA:  Post LEFT tip hemiarthroplasty EXAM: DG HIP (WITH OR WITHOUT PELVIS) 1V PORT LEFT COMPARISON:  Portable exam 1725 hours compared to 08/19/2022 FINDINGS: Interval resection of LEFT femoral head/neck and placement of a LEFT hip prosthesis. No acute fracture, dislocation, or bone destruction. IMPRESSION: LEFT femoral prosthesis without acute complication. Electronically Signed   By: Lavonia Dana M.D.   On: 08/21/2022 17:57   ECHOCARDIOGRAM COMPLETE  Result Date: 08/20/2022    ECHOCARDIOGRAM REPORT   Patient Name:   HELON WISINSKI Date of Exam: 08/20/2022 Medical Rec #:  [id number]          Height:       62.0 in Accession #:    [id number]        Weight:       132.2 lb Date of Birth:  1941/10/20         BSA:          1.603 m Patient Age:    54 years          BP:           128/86 mmHg Patient Gender: F                 HR:           70 bpm. Exam Location:  [facility] Procedure: 2D Echo, Color Doppler and Cardiac Doppler Indications:     R06.00 Dyspnea  History:         Patient has prior history of Echocardiogram examinations, most                  recent 05/14/2022. MitraClip procedure 02/15/2022, COPD and                  History of left breast cancer; Risk Factors:Hypertension.  Sonographer:     Rosalia Hammers Referring Phys:  [id number] SHERI HAMMOCK Diagnosing Phys: [name] MD  Sonographer Comments: Image acquisition challenging due to mastectomy. IMPRESSIONS  1. Left ventricular ejection fraction, by estimation, is 60 to 65%. The left ventricle has normal function. The left ventricle has no regional wall motion abnormalities. There is mild left ventricular hypertrophy. Left ventricular diastolic parameters are consistent with Grade  III diastolic dysfunction (restrictive).  2. Right ventricular systolic function is normal. The right ventricular size is normal. There is moderately elevated pulmonary artery systolic pressure.  3. Left atrial size was severely dilated.  4. The mitral valve is normal in structure. Moderate to severe mitral valve regurgitation. Moderate mitral stenosis. s/p mitral valve clip.  5. The aortic valve is normal in structure. Aortic valve regurgitation is not visualized. No aortic stenosis is present.  6. The inferior vena cava is normal in size with greater than 50% respiratory variability, suggesting right atrial pressure of 3 mmHg. FINDINGS  Left Ventricle: Left ventricular ejection fraction, by estimation, is 60 to 65%. The left ventricle has normal function. The left ventricle has no regional wall motion abnormalities. The left ventricular internal cavity size was normal in  size. There is  mild left ventricular hypertrophy. Left ventricular diastolic parameters are consistent with Grade III diastolic dysfunction (restrictive). Right Ventricle: The right ventricular size is normal. No increase in right ventricular wall thickness. Right ventricular systolic function is normal. There is moderately elevated pulmonary artery systolic pressure. The tricuspid regurgitant velocity is 3.32 m/s, and with an assumed right atrial pressure of 3 mmHg, the estimated right ventricular systolic pressure is 16.1 mmHg. Left Atrium: Left atrial size was severely dilated. Right Atrium: Right atrial size was normal in size. Pericardium: There is no evidence of pericardial effusion. Mitral Valve: The mitral valve is normal in structure. Moderate to severe mitral valve regurgitation. Moderate mitral valve stenosis. Tricuspid Valve: The tricuspid valve is normal in structure. Tricuspid valve regurgitation is mild . No evidence of tricuspid stenosis. Aortic Valve: The aortic valve is normal in structure. Aortic valve regurgitation is not visualized. No aortic stenosis is present. Aortic valve mean gradient measures 4.0 mmHg. Aortic valve peak gradient measures 8.8 mmHg. Aortic valve area, by VTI measures 2.42 cm. Pulmonic Valve: The pulmonic valve was normal in structure. Pulmonic valve regurgitation is mild. No evidence of pulmonic stenosis. Aorta: The aortic root is normal in size and structure. Venous: The inferior vena cava is normal in size with greater than 50% respiratory variability, suggesting right atrial pressure of 3 mmHg. IAS/Shunts: No atrial level shunt detected by color flow Doppler.  LEFT VENTRICLE PLAX 2D LVIDd:         5.10 cm   Diastology LVIDs:         3.20 cm   LV e' medial:   3.81 cm/s LV PW:         1.40 cm   LV E/e' medial: 59.8 LV IVS:        1.10 cm LVOT diam:     1.90 cm LV SV:         62 LV SV Index:   39 LVOT Area:     2.84 cm  RIGHT VENTRICLE RV Basal diam:  3.80 cm RV Mid diam:     3.60 cm RV S prime:     17.70 cm/s TAPSE (M-mode): 2.5 cm LEFT ATRIUM              Index        RIGHT ATRIUM           Index LA diam:        4.40 cm  2.74 cm/m   RA Area:     18.10 cm LA Vol (A2C):   160.0 ml 99.80 ml/m  RA Volume:   48.70 ml  30.38 ml/m LA Vol (A4C):   118.0 ml 73.60 ml/m  LA Biplane Vol: 143.0 ml 89.19 ml/m  AORTIC VALVE                    PULMONIC VALVE AV Area (Vmax):    2.59 cm     PR End Diast Vel: 6.25 msec AV Area (Vmean):   2.41 cm AV Area (VTI):     2.42 cm AV Vmax:           148.00 cm/s AV Vmean:          95.000 cm/s AV VTI:            0.257 m AV Peak Grad:      8.8 mmHg AV Mean Grad:      4.0 mmHg LVOT Vmax:         135.00 cm/s LVOT Vmean:        80.800 cm/s LVOT VTI:          0.219 m LVOT/AV VTI ratio: 0.85  AORTA Ao Root diam: 3.70 cm MITRAL VALVE                TRICUSPID VALVE MV Area (PHT): 3.89 cm     TR Peak grad:   44.1 mmHg MV Decel Time: 195 msec     TR Vmax:        332.00 cm/s MV E velocity: 228.00 cm/s MV A velocity: 82.10 cm/s   SHUNTS MV E/A ratio:  2.78         Systemic VTI:  0.22 m MV A Prime:    5.9 cm/s     Systemic Diam: 1.90 cm [name] MD Electronically signed by [name] MD Signature Date/Time: 08/20/2022/3:55:10 PM    Final     Assessment/Plan 1. Closed fracture of left hip with routine healing, subsequent encounter S/p left hemiarthroplasty of the left hip. Patient's pain well-controlled. Will discontinue methocarbamol and morphine due to side effects. Will continue Tylenol 1000 mg BID and tramadol 50 mg q6hr prn. Continue physical therapy with a goal of returning home at the time of discharge. Patient will continue lovenox 40 mg daily for DVT ppx.   2. Parkinson's disease without dyskinesia or fluctuating manifestations Patient's symptoms well-controlled on current regimen. Unclear if PD contributed to fall. Continue Sinemet as previously prescribed 25-50 mg 2 tablets in the morning and 1 tablet afternoon and night. Patient takes  clonazepam 0.5 mg nightly for PD related night terrors. Will continue at thsi time. Discussed concern for increased fall risk on this medication.   3. Chronic obstructive airway disease with asthma ([place]) Patient with significant history. Symptoms well-controlled on Trelegy and PRN albuterol. Will continue allergy medications as prescribed.   4. DNR (do not resuscitate) Patient maintains she would like to remain DNR. Has paperwork at home.   5. Advanced care planning/counseling discussion MOST form completed in 2021 shows DNR, Comfort measures, short term fluids, no feeding tube. Will return to discuss with patient to know that she would like these items upheld. Will plan to have family involved in conversation as patient desires.   6. Essential hypertension BP well controlled on current regimen with amlodipine 2.5 mg daily. CTM for need of medication given low dose.   7. Gastroesophageal reflux disease without esophagitis Discussed concern for decrease in bone mass secondary to prolonged PPIs. Will trial dose reduction with goal of discontinuation in the future. Continue Protonix 20 mg daily.   8. Osteoporosis, post-menopausal Patient has received Prolia infusions for 2-3 years. Discussed possibility of following  up wit endocrinology in an effort to continue conversation. Patient plans to discuss further with primary care physician.   9. Diastolic heart failure Hospitalization complicated by acute exacerabation. Patient appears euvolemic at this time. Continue lasix 20 mg daily.   Family/ staff Communication: Brother and nursing staff  Labs/tests ordered: CBC and BMP.  Tomasa Rand, MD, [facility] [phone]

## 2022-08-27 ENCOUNTER — Encounter: Payer: Self-pay | Admitting: Student

## 2022-08-27 ENCOUNTER — Non-Acute Institutional Stay (SKILLED_NURSING_FACILITY): Payer: Medicare PPO | Admitting: Student

## 2022-08-27 DIAGNOSIS — Z66 Do not resuscitate: Secondary | ICD-10-CM

## 2022-08-27 DIAGNOSIS — G20A1 Parkinson's disease without dyskinesia, without mention of fluctuations: Secondary | ICD-10-CM

## 2022-08-27 DIAGNOSIS — M810 Age-related osteoporosis without current pathological fracture: Secondary | ICD-10-CM

## 2022-08-27 DIAGNOSIS — I10 Essential (primary) hypertension: Secondary | ICD-10-CM | POA: Diagnosis not present

## 2022-08-27 DIAGNOSIS — J4489 Other specified chronic obstructive pulmonary disease: Secondary | ICD-10-CM

## 2022-08-27 DIAGNOSIS — K219 Gastro-esophageal reflux disease without esophagitis: Secondary | ICD-10-CM

## 2022-08-27 DIAGNOSIS — Z7189 Other specified counseling: Secondary | ICD-10-CM | POA: Diagnosis not present

## 2022-08-27 DIAGNOSIS — S72002D Fracture of unspecified part of neck of left femur, subsequent encounter for closed fracture with routine healing: Secondary | ICD-10-CM

## 2022-08-27 DIAGNOSIS — I5033 Acute on chronic diastolic (congestive) heart failure: Secondary | ICD-10-CM | POA: Diagnosis not present

## 2022-08-27 MED ORDER — PANTOPRAZOLE SODIUM 20 MG PO TBEC: 40.0000 mg | DELAYED_RELEASE_TABLET | Freq: Every day | ORAL | 2 refills | Status: DC

## 2022-08-28 ENCOUNTER — Encounter: Payer: Medicare PPO | Admitting: Family Medicine

## 2022-08-30 ENCOUNTER — Non-Acute Institutional Stay (SKILLED_NURSING_FACILITY): Payer: Medicare PPO | Admitting: Nurse Practitioner

## 2022-08-30 ENCOUNTER — Encounter: Payer: Self-pay | Admitting: Nurse Practitioner

## 2022-08-30 DIAGNOSIS — R300 Dysuria: Secondary | ICD-10-CM | POA: Diagnosis not present

## 2022-08-30 LAB — CBC AND DIFFERENTIAL
HCT: 27 — AB (ref 36–46)
Hemoglobin: 8.8 — AB (ref 12.0–16.0)
Neutrophils Absolute: 5436
Platelets: 323 10*3/uL (ref 150–400)
WBC: 7.2

## 2022-08-30 LAB — COMPREHENSIVE METABOLIC PANEL
Calcium: 8.5 — AB (ref 8.7–10.7)
eGFR: 85

## 2022-08-30 LAB — CBC: RBC: 2.86 — AB (ref 3.87–5.11)

## 2022-08-30 LAB — BASIC METABOLIC PANEL
BUN: 19 (ref 4–21)
CO2: 28 — AB (ref 13–22)
Chloride: 100 (ref 99–108)
Creatinine: 0.7 (ref 0.5–1.1)
Glucose: 86
Potassium: 4.2 meq/L (ref 3.5–5.1)
Sodium: 135 — AB (ref 137–147)

## 2022-08-30 NOTE — Progress Notes (Signed)
Location:  Other [facility]) Nursing Home Room Number: 118-A Place of Service:  SNF ([phone])  [name], MD  Patient Care Team: [name], MD as PCP - General (Family Medicine) Wellington Hampshire, MD as PCP - Cardiology (Cardiology) Mosetta Anis, MD as Referring Physician (Allergy) Megan Salon, MD as Consulting Physician (Gynecology) Tat, Eustace Quail, DO as Consulting Physician (Neurology) Nicholas Lose, MD as Consulting Physician (Hematology and Oncology) Tat, Eustace Quail, DO as Consulting Physician (Neurology)  Extended Emergency Contact Information Primary Emergency Contact: [facility] Phone: [phone] Relation: Niece Preferred language: [place] Interpreter needed? No Secondary Emergency Contact: Emmie Niemann, [place] Home Phone: [phone] Mobile Phone: [phone] Relation: Brother  Goals of care: Advanced Directive information    08/30/2022    4:10 PM  Advanced Directives  Does Patient Have a Medical Advance Directive? Yes  Type of Paramedic of [place];Living will;Out of facility DNR (pink MOST or yellow form)  Does patient want to make changes to medical advance directive? No - Patient declined  Copy of [place] in Chart? Yes - validated most recent copy scanned in chart (See row information)  Pre-existing out of facility DNR order (yellow form or pink MOST form) Yellow form placed in chart (order not valid for inpatient use);Pink MOST form placed in chart (order not valid for inpatient use)     Chief Complaint  Patient presents with   Acute Visit    Urinary frequency     HPI:  Pt is a 81 y.o. adult seen today for an acute visit for urinary frequency and burning with urination. Staff reports patient has had increased frequency of urination that has worsened over the last day.  She is s/p left hip arthroplasty due to hip fracture. Having PT/OT at [facility].   She  reports increase chills (no fever).  No abdominal pain or worsening back/side pain.  Urine was very cloudy with odor per nursing.  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 Burt Knack in the A2/P2 position   Traumatic closed fracture of distal clavicle with minimal displacement, left, initial encounter 06/2021   [facility]   Uterine fibroid    Vitamin D deficiency    Past Surgical History:  Procedure Laterality Date   ABDOMINAL HYSTERECTOMY     & BSO for Mucinous borderline tumor of [place] 2004   APPENDECTOMY  2004   BREAST BIOPSY  08/2004   right breast-benign   BREAST LUMPECTOMY  1992   benign   BUBBLE STUDY  12/27/2021   Procedure: BUBBLE STUDY;  Surgeon: Geralynn Rile, MD;  Location: [facility] ENDOSCOPY;  Service: Cardiovascular;;   CARDIAC CATHETERIZATION  2007   essentially negation for significant CAD   CHOLECYSTECTOMY  2001   COLONOSCOPY  2014   Dr [name] , due 2019   COLONOSCOPY  09/2018   2 TA removed, int hem, no f/u needed (Dr  [name])    ESOPHAGOGASTRODUODENOSCOPY  09/2018   GERD with esophagitis and stricture dilated, antral gastritis negative for H pylori [name])   HIP ARTHROPLASTY Left 08/21/2022   Procedure: ARTHROPLASTY BIPOLAR HIP (HEMIARTHROPLASTY);  Surgeon: Thornton Park, MD;  Location: [facility] ORS;  Service: Orthopedics;  Laterality: Left;   LUNG BIOPSY  2002   [name], Dr [name]   MASTECTOMY MODIFIED RADICAL Left 2002   oral chemotheraphy (tamoxifen then Armidex) no radiation, Dr.[name]   MITRAL VALVE REPAIR N/A 02/15/2022   Procedure: MITRAL VALVE REPAIR;  Surgeon: Sherren Mocha, MD;  Location: [place] CV LAB;  Service: Cardiovascular;  Laterality: N/A;   RIGHT/LEFT HEART CATH AND CORONARY ANGIOGRAPHY Bilateral 11/14/2021   Procedure: RIGHT/LEFT HEART CATH AND CORONARY ANGIOGRAPHY;  Surgeon: Wellington Hampshire, MD;  Location: [facility] CV LAB;  Service: Cardiovascular;  Laterality: Bilateral;   TEE WITHOUT CARDIOVERSION N/A 12/29/2018   Procedure: TRANSESOPHAGEAL ECHOCARDIOGRAM (TEE);  Surgeon: Wellington Hampshire, MD;  Location: [facility] ORS;  Service: Cardiovascular;  Laterality: N/A;   TEE WITHOUT CARDIOVERSION N/A 12/27/2021   Procedure: TRANSESOPHAGEAL ECHOCARDIOGRAM (TEE);  Surgeon: Geralynn Rile, MD;  Location: [facility];  Service: Cardiovascular;  Laterality: N/A;   TEE WITHOUT CARDIOVERSION N/A 02/15/2022   Procedure: TRANSESOPHAGEAL ECHOCARDIOGRAM (TEE);  Surgeon: Sherren Mocha, MD;  Location: [place] CV LAB;  Service: Cardiovascular;  Laterality: N/A;   TEE WITHOUT CARDIOVERSION N/A 05/14/2022   Procedure: TRANSESOPHAGEAL ECHOCARDIOGRAM (TEE);  Surgeon: Geralynn Rile, MD;  Location: [facility] ENDOSCOPY;  Service: Cardiovascular;  Laterality: N/A;   TONSILLECTOMY  1946    Allergies  Allergen Reactions   Sulfa Antibiotics Anaphylaxis   Sulfonamide Derivatives Anaphylaxis   Topamax [Topiramate] Other (See Comments)    Metabolic acidosis    Clarithromycin Other (See Comments)     pericarditis Other reaction(s): Not available   Hydrocodone Other (See Comments)    "hyper and climbing the walls"   Motrin [Ibuprofen] Other (See Comments)    headaches   Misc. Sulfonamide Containing Compounds     Other reaction(s): Not available   Symbicort [Budesonide-Formoterol Fumarate] Other (See Comments)    02/07/15 tremor   Percocet [Oxycodone-Acetaminophen] Itching and Rash   Tape Itching and Rash    Use paper tape only   Tetracyclines & Related Other (See Comments)    "immediate yeast infection"    Outpatient Encounter Medications as of 08/30/2022  Medication Sig   acetaminophen (TYLENOL) 500 MG tablet Take 1,000 mg by mouth every 12 (twelve) hours as needed.   albuterol (VENTOLIN HFA) 108 (90 Base) MCG/ACT inhaler Inhale 2 puffs into the lungs every 6 (six) hours as needed for wheezing or shortness of breath.   amLODipine (NORVASC) 2.5 MG tablet Take 1 tablet (2.5 mg total) by mouth daily.   aspirin 81 MG tablet Take 1 tablet (81 mg total) by mouth daily.   carbidopa-levodopa ([name]) 25-100 MG tablet Take 1 tablet by mouth 2 (two) times  daily. See other 3 listings all reflected from [facility]   carbidopa-levodopa ([name]) 25-100 MG tablet Take 2 tablets by mouth daily. See other 3 listings all reflected from [facility]   Carbidopa-Levodopa ER (SINEMET CR) 25-100 MG tablet controlled release Take 1 tablet by mouth 2 (two) times daily. See other 3 listings all reflected from [facility]   Carbidopa-Levodopa ER (SINEMET CR) 25-100 MG tablet controlled release Take 2 tablets by mouth in the morning. See other 3 listings all reflected from [facility]   clonazePAM (KLONOPIN) 0.5 MG tablet Take 0.5 mg by mouth at bedtime.   enoxaparin (LOVENOX) 40 MG/0.4ML injection Inject 0.4 mLs (40 mg total) into the skin daily for 14 days.   EPINEPHrine 0.3 mg/0.3 mL IJ SOAJ  injection Inject 0.3 mg into the muscle as needed for anaphylaxis.   ferrous sulfate 325 (65 FE) MG tablet Take 1 tablet (325 mg total) by mouth 2 (two) times daily with a meal.   fexofenadine (ALLEGRA) 180 MG tablet Take 180 mg by mouth daily.   Fluticasone-Umeclidin-Vilant (TRELEGY ELLIPTA) 100-62.5-25 MCG/ACT AEPB Inhale 1 puff into the lungs daily.   furosemide (LASIX) 20 MG tablet Take 1 tablet (20 mg total) by mouth daily.   gabapentin ([name]) 300 MG capsule TAKE 2 CAPSULES BY MOUTH 3 TIMES DAILY   pantoprazole (PROTONIX) 20 MG tablet Take 20 mg by mouth daily.   potassium chloride SA (KLOR-CON M) 20 MEQ tablet Take 1 tablet (20 mEq total) by mouth daily.   senna (SENOKOT) 8.6 MG TABS tablet Take 1 tablet (8.6 mg total) by mouth 2 (two) times daily.   [DISCONTINUED] acetaminophen (TYLENOL) 500 MG tablet Take 2 tablets (1,000 mg total) by mouth 2 (two) times daily as needed.   [DISCONTINUED] Carbidopa-Levodopa ER (SINEMET CR) 25-100 MG tablet controlled release TAKE 2 TABLET BY MOUTH AT 9 AM, 1 TAB AT1 PM AND 1 TAB AT 5 PM (Patient not taking: Reported on 08/30/2022)   [DISCONTINUED] clonazePAM (KLONOPIN) 0.5 MG tablet TAKE 1/2 A TABLET BY MOUTH AT BEDTIME   [DISCONTINUED] pantoprazole (PROTONIX) 20 MG tablet Take 2 tablets (40 mg total) by mouth daily.   [DISCONTINUED] traMADol (ULTRAM) 50 MG tablet Take 1 tablet (50 mg total) by mouth every 6 (six) hours.   No facility-administered encounter medications on file as of 08/30/2022.    Review of Systems  Constitutional:  Positive for chills and fatigue. Negative for activity change, appetite change and fever.  Genitourinary:  Positive for dysuria, frequency and urgency. Negative for flank pain.  Musculoskeletal:  Positive for arthralgias (chronic).    Immunization History  Administered Date(s) Administered   Fluad Quad(high Dose 65+) 08/11/2019, 08/03/2020, 08/08/2021   Influenza Split 09/11/2011   Influenza Whole 09/09/2007,  08/08/2008, 08/15/2010, 08/26/2012   Influenza,inj,Quad PF,6+ Mos 08/18/2013, 08/20/2014, 08/25/2018   Influenza-Unspecified 09/27/2015, 08/26/2017   Moderna Sars-Covid-2 Vaccination 01/01/2020, 01/20/2020, 02/16/2020, 07/25/2020, 11/22/2020   Pneumococcal Conjugate-13 02/12/2014   Pneumococcal Polysaccharide-23 11/26/2006, 10/21/2012, 11/22/2020, 11/21/2021   Td 01/01/2011, 06/30/2021   Zoster Recombinat (Shingrix) 02/22/2018, 07/08/2019   Zoster, Live 05/26/2007   Pertinent  Health Maintenance Due  Topic Date Due   MAMMOGRAM  06/14/2022   INFLUENZA VACCINE  06/26/2022   COLONOSCOPY (Pts 45-58yr Insurance coverage will need to be confirmed)  10/10/2023   DEXA SCAN  Completed      08/22/2022    8:26  AM 08/22/2022    9:00 PM 08/23/2022    8:00 AM 08/24/2022    2:00 AM 08/24/2022    8:00 AM  Fall Risk  Patient Fall Risk Level High fall risk High fall risk High fall risk High fall risk High fall risk   Functional Status Survey:    Vitals:   08/30/22 1608  BP: 114/68  Pulse: 68  Resp: 18  Temp: 97.8 F (36.6 C)  SpO2: 96%  Weight: 116 lb (52.6 kg)  Height: '5\' 2"'$  (1.575 m)   Body mass index is 21.22 kg/m. Physical Exam Constitutional:      General: She is not in acute distress.    Appearance: She is well-developed. She is not diaphoretic.  HENT:     Head: Normocephalic and atraumatic.     Mouth/Throat:     Pharynx: No oropharyngeal exudate.  Eyes:     Conjunctiva/sclera: Conjunctivae normal.     Pupils: Pupils are equal, round, and reactive to light.  Cardiovascular:     Rate and Rhythm: Normal rate and regular rhythm.     Heart sounds: Normal heart sounds.  Pulmonary:     Effort: Pulmonary effort is normal.     Breath sounds: Normal breath sounds.  Abdominal:     General: Bowel sounds are normal.     Palpations: Abdomen is soft.     Tenderness: There is abdominal tenderness (suprapubic).  Musculoskeletal:     Cervical back: Normal range of motion and neck  supple.     Right lower leg: Edema (trace) present.     Left lower leg: Edema (trace) present.  Skin:    General: Skin is warm and dry.  Neurological:     Mental Status: She is alert.  Psychiatric:        Mood and Affect: Mood normal.     Labs reviewed: Recent Labs    08/21/22 0755 08/22/22 0516 08/23/22 0409 08/24/22 0658  NA 138 136 134* 133*  K 4.8 4.5 4.3 4.2  CL 104 106 103 103  CO2 '28 26 27 25  '$ GLUCOSE 107* 133* 100* 103*  BUN 21 28* 29* 26*  CREATININE 0.77 0.80 0.67 0.57  CALCIUM 9.1 8.2* 8.1* 8.3*  MG 2.3  --   --   --    Recent Labs    02/12/22 1109 06/15/22 1113  AST 16 16  ALT <5 <5  ALKPHOS 63 62  BILITOT 1.2 1.7*  PROT 6.9 7.3  ALBUMIN 3.8 4.0   Recent Labs    02/03/22 1529 02/12/22 1109 03/23/22 1221 06/15/22 1113 08/19/22 2106 08/20/22 0555 08/22/22 0516 08/23/22 0409 08/24/22 0658  WBC 6.0   < > 4.4   < > 6.5   < > 10.6* 8.1 7.3  NEUTROABS 4.2  --  2.9  --  5.0  --   --   --   --   HGB 12.0   < > 11.6*   < > 12.0   < > 9.3* 8.3* 8.1*  HCT 38.4   < > 35.6*   < > 37.9   < > 30.1* 26.5* 25.1*  MCV 96.0   < > 93.9   < > 94.3   < > 95.9 95.3 94.4  PLT 149*   < > 131.0*   < > 151   < > 108* 107* 133*   < > = values in this interval not displayed.   Lab Results  Component Value Date   TSH 0.84 03/23/2022  Lab Results  Component Value Date   HGBA1C 5.3 01/14/2018   Lab Results  Component Value Date   CHOL 171 08/08/2021   HDL 53.30 08/08/2021   LDLCALC 97 08/08/2021   LDLDIRECT 135.0 08/16/2009   TRIG 100.0 08/08/2021   CHOLHDL 3 08/08/2021    Significant Diagnostic Results in last 30 days:  DG Chest Port 1 View  Result Date: 08/24/2022 CLINICAL DATA:  [id number] hypoxia EXAM: PORTABLE CHEST 1 VIEW COMPARISON:  Prior chest x-ray 08/19/2022 FINDINGS: Stable cardiomegaly. Increased pulmonary vascular congestion now with mild interstitial edema. New airspace opacity at the left lung base partially obscures the left cardiac margin and  diaphragm. There is blunting of the costophrenic angle. Findings are suggestive of moderate layering effusion and associated atelectasis. Nonspecific patchy airspace opacity at the right lung base. Stable postsurgical changes in the medial right upper lung. No pneumothorax. IMPRESSION: 1. Increased pulmonary vascular congestion likely with mild edema. 2. Small to moderate left pleural effusion with associated atelectasis versus infiltrate. 3. Nonspecific patchy opacity in the right lung base may reflect atelectasis or infiltrate. Electronically Signed   By: Jacqulynn Cadet M.D.   On: 08/24/2022 07:53   DG Hip Port Unilat With Pelvis 1V Left  Result Date: 08/21/2022 CLINICAL DATA:  Post LEFT tip hemiarthroplasty EXAM: DG HIP (WITH OR WITHOUT PELVIS) 1V PORT LEFT COMPARISON:  Portable exam 1725 hours compared to 08/19/2022 FINDINGS: Interval resection of LEFT femoral head/neck and placement of a LEFT hip prosthesis. No acute fracture, dislocation, or bone destruction. IMPRESSION: LEFT femoral prosthesis without acute complication. Electronically Signed   By: Lavonia Dana M.D.   On: 08/21/2022 17:57   ECHOCARDIOGRAM COMPLETE  Result Date: 08/20/2022    ECHOCARDIOGRAM REPORT   Patient Name:   Crystal Bass Date of Exam: 08/20/2022 Medical Rec #:  [id number]         Height:       62.0 in Accession #:    [id number]        Weight:       132.2 lb Date of Birth:  1941/02/02         BSA:          1.603 m Patient Age:    81 years          BP:           128/86 mmHg Patient Gender: F                 HR:           70 bpm. Exam Location:  [facility] Procedure: 2D Echo, Color Doppler and Cardiac Doppler Indications:     R06.00 Dyspnea  History:         Patient has prior history of Echocardiogram examinations, most                  recent 05/14/2022. MitraClip procedure 02/15/2022, COPD and                  History of left breast cancer; Risk Factors:Hypertension.  Sonographer:     Rosalia Hammers Referring Phys:  [id number] SHERI  HAMMOCK Diagnosing Phys: [name] MD  Sonographer Comments: Image acquisition challenging due to mastectomy. IMPRESSIONS  1. Left ventricular ejection fraction, by estimation, is 60 to 65%. The left ventricle has normal function. The left ventricle has no regional wall motion abnormalities. There is mild left ventricular hypertrophy. Left ventricular diastolic parameters are consistent with Grade III diastolic dysfunction (restrictive).  2. Right ventricular systolic function is normal. The right ventricular size is normal. There is moderately elevated pulmonary artery systolic pressure.  3. Left atrial size was severely dilated.  4. The mitral valve is normal in structure. Moderate to severe mitral valve regurgitation. Moderate mitral stenosis. s/p mitral valve clip.  5. The aortic valve is normal in structure. Aortic valve regurgitation is not visualized. No aortic stenosis is present.  6. The inferior vena cava is normal in size with greater than 50% respiratory variability, suggesting right atrial pressure of 3 mmHg. FINDINGS  Left Ventricle: Left ventricular ejection fraction, by estimation, is 60 to 65%. The left ventricle has normal function. The left ventricle has no regional wall motion abnormalities. The left ventricular internal cavity size was normal in size. There is  mild left ventricular hypertrophy. Left ventricular diastolic parameters are consistent with Grade III diastolic dysfunction (restrictive). Right Ventricle: The right ventricular size is normal. No increase in right ventricular wall thickness. Right ventricular systolic function is normal. There is moderately elevated pulmonary artery systolic pressure. The tricuspid regurgitant velocity is 3.32 m/s, and with an assumed right atrial pressure of 3 mmHg, the estimated right ventricular systolic pressure is 61.4 mmHg. Left Atrium: Left atrial size was severely dilated. Right Atrium: Right atrial size was normal in size. Pericardium:  There is no evidence of pericardial effusion. Mitral Valve: The mitral valve is normal in structure. Moderate to severe mitral valve regurgitation. Moderate mitral valve stenosis. Tricuspid Valve: The tricuspid valve is normal in structure. Tricuspid valve regurgitation is mild . No evidence of tricuspid stenosis. Aortic Valve: The aortic valve is normal in structure. Aortic valve regurgitation is not visualized. No aortic stenosis is present. Aortic valve mean gradient measures 4.0 mmHg. Aortic valve peak gradient measures 8.8 mmHg. Aortic valve area, by VTI measures 2.42 cm. Pulmonic Valve: The pulmonic valve was normal in structure. Pulmonic valve regurgitation is mild. No evidence of pulmonic stenosis. Aorta: The aortic root is normal in size and structure. Venous: The inferior vena cava is normal in size with greater than 50% respiratory variability, suggesting right atrial pressure of 3 mmHg. IAS/Shunts: No atrial level shunt detected by color flow Doppler.  LEFT VENTRICLE PLAX 2D LVIDd:         5.10 cm   Diastology LVIDs:         3.20 cm   LV e' medial:   3.81 cm/s LV PW:         1.40 cm   LV E/e' medial: 59.8 LV IVS:        1.10 cm LVOT diam:     1.90 cm LV SV:         62 LV SV Index:   39 LVOT Area:     2.84 cm  RIGHT VENTRICLE RV Basal diam:  3.80 cm RV Mid diam:    3.60 cm RV S prime:     17.70 cm/s TAPSE (M-mode): 2.5 cm LEFT ATRIUM              Index        RIGHT ATRIUM           Index LA diam:        4.40 cm  2.74 cm/m   RA Area:     18.10 cm LA Vol (A2C):   160.0 ml 99.80 ml/m  RA Volume:   48.70 ml  30.38 ml/m LA Vol (A4C):   118.0 ml 73.60 ml/m LA Biplane Vol: 143.0 ml 89.19  ml/m  AORTIC VALVE                    PULMONIC VALVE AV Area (Vmax):    2.59 cm     PR End Diast Vel: 6.25 msec AV Area (Vmean):   2.41 cm AV Area (VTI):     2.42 cm AV Vmax:           148.00 cm/s AV Vmean:          95.000 cm/s AV VTI:            0.257 m AV Peak Grad:      8.8 mmHg AV Mean Grad:      4.0 mmHg LVOT  Vmax:         135.00 cm/s LVOT Vmean:        80.800 cm/s LVOT VTI:          0.219 m LVOT/AV VTI ratio: 0.85  AORTA Ao Root diam: 3.70 cm MITRAL VALVE                TRICUSPID VALVE MV Area (PHT): 3.89 cm     TR Peak grad:   44.1 mmHg MV Decel Time: 195 msec     TR Vmax:        332.00 cm/s MV E velocity: 228.00 cm/s MV A velocity: 82.10 cm/s   SHUNTS MV E/A ratio:  2.78         Systemic VTI:  0.22 m MV A Prime:    5.9 cm/s     Systemic Diam: 1.90 cm [name] MD Electronically signed by [name] MD Signature Date/Time: 08/20/2022/3:55:10 PM    Final    DG Hip Unilat W or Wo Pelvis 2-3 Views Left  Addendum Date: 08/19/2022   ADDENDUM REPORT: 08/19/2022 22:32 ADDENDUM: Please note findings and impression should state acute displaced left femoral neck fracture (NOT humeral). Electronically Signed   By: Iven Finn M.D.   On: 08/19/2022 22:32   Result Date: 08/19/2022 CLINICAL DATA:  fall EXAM: DG HIP (WITH OR WITHOUT PELVIS) 2-3V LEFT COMPARISON:  CT abdomen pelvis 02/19/2018 FINDINGS: Acute displaced left humeral neck fracture that is poorly visualized. No dislocation of the left hip. No acute displaced fracture or dislocation of the right hip on the frontal view. No acute displaced fracture or diastasis of the bones of the pelvis. There is no evidence of severe arthropathy or other focal bone abnormality. Degenerative changes visualized of the lower lumbar spine. IMPRESSION: Acute displaced left humeral neck fracture that is poorly visualized. Electronically Signed: By: Iven Finn M.D. On: 08/19/2022 22:09   DG Chest 1 View  Result Date: 08/19/2022 CLINICAL DATA:  [id number].  Fall EXAM: CHEST  1 VIEW COMPARISON:  None Available. FINDINGS: The heart and mediastinal contours is grossly unchanged with limited evaluation due to patient rotation and overlying right upper lobe disease and scarring. Prominent hilar vasculature. Redemonstration of right upper lobe architectural torsion/scarring and  wedge resection with known bronchiectasis not well visualized. Interval development of diffuse increased interstitial and airspace opacities. Nonspecific blunting of the right costophrenic angle. No definite pleural effusion. No pneumothorax. No acute osseous abnormality. IMPRESSION: Interval development of diffuse increased interstitial and airspace opacities with underlying architectural scarring and wedge resection in the right upper lobe. Findings likely represent pulmonary edema with superimposed infection not excluded. Electronically Signed   By: Iven Finn M.D.   On: 08/19/2022 22:22    Assessment/Plan 1. Dysuria -start Augmentin 875/'125mg'$  by  mouth twice daily 7 days.  -awaiting culture and sensitivity  -increase water intake.    Carlos American. [place], [facility] [phone]

## 2022-08-31 ENCOUNTER — Telehealth: Payer: Self-pay | Admitting: Adult Health

## 2022-08-31 MED ORDER — TRAMADOL HCL 50 MG PO TABS: 50.0000 mg | ORAL_TABLET | Freq: Three times a day (TID) | ORAL | 0 refills | Status: AC | PRN

## 2022-08-31 NOTE — Telephone Encounter (Signed)
Nurse called to report that the resident was having post op hip pain. She has tried tylenol with no relief. Had an order for ultram which expired. Will reorder.

## 2022-09-02 NOTE — Progress Notes (Unsigned)
   Patient ID: Crystal SHOBERG, adult    DOB: Jun 27, 1941, 81 y.o.   MRN: [id number]  HPI  Crystal Bass is a 81 y/o female with a history of  Echo report from 08/20/22 reviewed and sowed an EF of 60-65% along with mild LVH, moderately elevated pulmonary artery pressure, severe LAE, moderate/severe MR and moderate mitral stenosis s/p mitral valve clip.   RHC/LHC done 11/14/21 and showed: 1.  Normal coronary arteries. 2.  Normal left ventricular systolic function. 3.  Moderate to severe mitral regurgitation by left ventricular angiography and severe by hemodynamic evaluation 4.  Right heart catheterization showed mildly elevated filling pressures, moderate pulmonary hypertension and normal cardiac output.  Giant V waves on PCW consistent with severe mitral regurgitation.  RA: 10 / 7 mmHg RV: 42/4 mmHg PA: 47/21 with a mean of 35 mmHg PCW: A wave is 19 mmHg, V wave is 34 mmHg and mean is 21 mmHg Cardiac output is 4.68 with a cardiac index of 3.03.  Admitted 08/19/22 due to left hip fracture after a mechanical fall. Found to also be in HF exacerbation. Cardiology and orthopaedics consults obtained. Left hip hemiarthroplasty done. Hypokalemia corrected. PT/OT evaluations done. Discharged after 5 days to rehab.   She presents today for her initial visit with a chief complaint of   Review of Systems    Physical Exam  Assessment & Plan:  1: Chronic heart failure with preserved ejection fraction with structural changes (LVH/LAE)- - NYHA class  - saw cardiology Fletcher Anon) 08/09/22 - BNP 08/18/22 was 465.7  2: HTN- - BP - saw PCP [name]) 06/04/22 and currently seeing PCP at rehab - BMP 08/24/22 reviewed and showed sodium 133, potassium 4.2, creatinine 0.57 and GFR >60  3: COPD- - saw pulmonology Belenda Cruise) 04/27/22  4: Left hip pain- - recent fraction with surgical correction

## 2022-09-03 ENCOUNTER — Encounter: Payer: Self-pay | Admitting: Family

## 2022-09-03 ENCOUNTER — Ambulatory Visit: Payer: Medicare PPO | Attending: Family | Admitting: Family

## 2022-09-03 VITALS — BP 107/56 | HR 71 | Resp 14 | Ht 62.0 in | Wt 126.0 lb

## 2022-09-03 DIAGNOSIS — G20A1 Parkinson's disease without dyskinesia, without mention of fluctuations: Secondary | ICD-10-CM | POA: Insufficient documentation

## 2022-09-03 DIAGNOSIS — G479 Sleep disorder, unspecified: Secondary | ICD-10-CM | POA: Insufficient documentation

## 2022-09-03 DIAGNOSIS — W19XXXA Unspecified fall, initial encounter: Secondary | ICD-10-CM | POA: Insufficient documentation

## 2022-09-03 DIAGNOSIS — Z803 Family history of malignant neoplasm of breast: Secondary | ICD-10-CM | POA: Insufficient documentation

## 2022-09-03 DIAGNOSIS — J4489 Other specified chronic obstructive pulmonary disease: Secondary | ICD-10-CM | POA: Insufficient documentation

## 2022-09-03 DIAGNOSIS — I110 Hypertensive heart disease with heart failure: Secondary | ICD-10-CM | POA: Diagnosis not present

## 2022-09-03 DIAGNOSIS — I5032 Chronic diastolic (congestive) heart failure: Secondary | ICD-10-CM | POA: Diagnosis not present

## 2022-09-03 DIAGNOSIS — I272 Pulmonary hypertension, unspecified: Secondary | ICD-10-CM | POA: Insufficient documentation

## 2022-09-03 DIAGNOSIS — I052 Rheumatic mitral stenosis with insufficiency: Secondary | ICD-10-CM | POA: Insufficient documentation

## 2022-09-03 DIAGNOSIS — I10 Essential (primary) hypertension: Secondary | ICD-10-CM | POA: Diagnosis not present

## 2022-09-03 DIAGNOSIS — Z8719 Personal history of other diseases of the digestive system: Secondary | ICD-10-CM | POA: Insufficient documentation

## 2022-09-03 DIAGNOSIS — K219 Gastro-esophageal reflux disease without esophagitis: Secondary | ICD-10-CM | POA: Insufficient documentation

## 2022-09-03 DIAGNOSIS — M25552 Pain in left hip: Secondary | ICD-10-CM

## 2022-09-03 NOTE — Patient Instructions (Signed)
Continue weighing daily and call for an overnight weight gain of 3 pounds or more or a weekly weight gain of more than 5 pounds.   If you have voicemail, please make sure your mailbox is cleaned out so that we may leave a message and please make sure to listen to any voicemails.     

## 2022-09-05 DIAGNOSIS — M79641 Pain in right hand: Secondary | ICD-10-CM | POA: Diagnosis not present

## 2022-09-11 ENCOUNTER — Ambulatory Visit: Payer: Medicare PPO | Attending: Nurse Practitioner | Admitting: Nurse Practitioner

## 2022-09-11 ENCOUNTER — Encounter: Payer: Self-pay | Admitting: Nurse Practitioner

## 2022-09-11 VITALS — BP 102/58 | HR 67 | Ht 62.0 in | Wt 124.4 lb

## 2022-09-11 DIAGNOSIS — Z95818 Presence of other cardiac implants and grafts: Secondary | ICD-10-CM | POA: Diagnosis not present

## 2022-09-11 DIAGNOSIS — I951 Orthostatic hypotension: Secondary | ICD-10-CM

## 2022-09-11 DIAGNOSIS — Z9889 Other specified postprocedural states: Secondary | ICD-10-CM | POA: Diagnosis not present

## 2022-09-11 DIAGNOSIS — I340 Nonrheumatic mitral (valve) insufficiency: Secondary | ICD-10-CM | POA: Diagnosis not present

## 2022-09-11 DIAGNOSIS — I10 Essential (primary) hypertension: Secondary | ICD-10-CM

## 2022-09-11 DIAGNOSIS — I5032 Chronic diastolic (congestive) heart failure: Secondary | ICD-10-CM | POA: Diagnosis not present

## 2022-09-11 NOTE — Progress Notes (Signed)
Office Visit    Patient Name: Crystal Bass Date of Encounter: 09/11/2022  Primary Care Provider:  [name], MD Primary Cardiologist:  [name], MD  Chief Complaint    81 year old female with a history of mitral valve prolapse with severe regurgitation status post MitraClip (March 2023), chronic exertional dyspnea, chest pain with normal coronary arteries (December 2022), orthostatic hypotension, COPD and absence of smoking history, and Parkinson's, who presents for follow-up related to mitral valve disease.  Past Medical History    Past Medical History:  Diagnosis Date   Aneurysm of aorta ([place])    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   Anginal pain ([place])    a. 10/2021 Cath: Nl cors.   Aortic root aneurysm (H[facility])    Arthritis    Asthma    Breast cancer ([place]) 2002   infiltrative ductal carcinoma    Cataract 2019   Chronic heart failure with preserved ejection fraction (HFpEF) ([facility])    COPD (chronic obstructive pulmonary disease) ([place]) 07/2007   Ductal carcinoma of breast, estrogen receptor positive, stage 1 ([place]) 09/16/2012   Endometriosis    GERD (gastroesophageal reflux disease)    Hypercalcemia 09/14/2013   Hypertension    Lesion of breast 1992   right, benign   Lung disease    secondary to [name] infection   Mastalgia 05/1994   Mitral regurgitation    a. 10/2021 RHC: PCWP V wave of 34; b. 01/2022 TEE: Severe flail/prolapse of large area of A2 scallop with severe posteriorly directed MR; c. 01/2022 TEER w /Mitraclip XTW x 2; d. 07/2022 Echo: EF 60-65%. No rwma, mild LVH, GrIII DD. Sev dil LA. Mod elev PASP. Mod-Sev MR. Mod MS.   Osteoporosis, post-menopausal 03/14/2012   Ovarian tumor of borderline malignancy, right 2004   Palpitations    a. 02/2022 Zio: Predominantly sinus rhythm at 67 (52-171).  2 runs of NSVT -fastest 9 beats at 167.  35 SVT runs-longest 19 beats, fastest 171x6 beats.   Pancreatitis    secondary to Cholelithiasis   Parkinson  disease 02/10/2015   Post-thoracotomy pain syndrome    Pseudomonas pneumonia ([place]) 03/2008   Status post implantation of mitral valve leaflet clip 02/15/2022   s/p XTW MitraClip x2 by Dr. Burt Knack in the A2/P2 position   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   APPENDECTOMY  2004   BREAST BIOPSY  08/2004   right breast-benign   BREAST LUMPECTOMY  1992   benign   BUBBLE STUDY  12/27/2021   Procedure: BUBBLE STUDY;  Surgeon: Geralynn Rile, MD;  Location: [facility] ENDOSCOPY;  Service: Cardiovascular;;   CARDIAC CATHETERIZATION  2007   essentially negation for significant CAD   CHOLECYSTECTOMY  2001   COLONOSCOPY  2014   Dr [name] , due 2019   COLONOSCOPY  09/2018   2 TA removed, int hem, no f/u needed (Dr [name])    ESOPHAGOGASTRODUODENOSCOPY  09/2018   GERD with esophagitis and stricture dilated, antral gastritis negative for H pylori [name])   HIP ARTHROPLASTY Left 08/21/2022   Procedure: ARTHROPLASTY BIPOLAR HIP (HEMIARTHROPLASTY);  Surgeon: Thornton Park, MD;  Location: [facility] ORS;  Service: Orthopedics;  Laterality: Left;   LUNG BIOPSY  2002  [name], Dr [name]   MASTECTOMY MODIFIED RADICAL Left 2002   oral chemotheraphy (tamoxifen then Armidex) no radiation, Dr.[name]   MITRAL VALVE REPAIR N/A 02/15/2022   Procedure: MITRAL VALVE REPAIR;  Surgeon: Sherren Mocha, MD;  Location: [place] CV LAB;  Service: Cardiovascular;  Laterality: N/A;   RIGHT/LEFT HEART CATH AND CORONARY ANGIOGRAPHY Bilateral 11/14/2021   Procedure: RIGHT/LEFT HEART CATH AND CORONARY ANGIOGRAPHY;  Surgeon: Wellington Hampshire, MD;  Location: [place] CV LAB;  Service: Cardiovascular;  Laterality: Bilateral;   TEE WITHOUT CARDIOVERSION N/A 12/29/2018    Procedure: TRANSESOPHAGEAL ECHOCARDIOGRAM (TEE);  Surgeon: Wellington Hampshire, MD;  Location: [facility] ORS;  Service: Cardiovascular;  Laterality: N/A;   TEE WITHOUT CARDIOVERSION N/A 12/27/2021   Procedure: TRANSESOPHAGEAL ECHOCARDIOGRAM (TEE);  Surgeon: Geralynn Rile, MD;  Location: [place];  Service: Cardiovascular;  Laterality: N/A;   TEE WITHOUT CARDIOVERSION N/A 02/15/2022   Procedure: TRANSESOPHAGEAL ECHOCARDIOGRAM (TEE);  Surgeon: Sherren Mocha, MD;  Location: [place] CV LAB;  Service: Cardiovascular;  Laterality: N/A;   TEE WITHOUT CARDIOVERSION N/A 05/14/2022   Procedure: TRANSESOPHAGEAL ECHOCARDIOGRAM (TEE);  Surgeon: Geralynn Rile, MD;  Location: [facility] ENDOSCOPY;  Service: Cardiovascular;  Laterality: N/A;   TONSILLECTOMY  1946    Allergies  Allergies  Allergen Reactions   Sulfa Antibiotics Anaphylaxis   Sulfonamide Derivatives Anaphylaxis   Topamax [Topiramate] Other (See Comments)    Metabolic acidosis    Clarithromycin Other (See Comments)    pericarditis Other reaction(s): Not available   Hydrocodone Other (See Comments)    "hyper and climbing the walls"   Motrin [Ibuprofen] Other (See Comments)    headaches   Misc. Sulfonamide Containing Compounds     Other reaction(s): Not available   Symbicort [Budesonide-Formoterol Fumarate] Other (See Comments)    02/07/15 tremor   Tetracycline Hcl Other (See Comments)   Percocet [Oxycodone-Acetaminophen] Itching and Rash   Tape Itching and Rash    Use paper tape only   Tetracyclines & Related Other (See Comments)    "immediate yeast infection"    History of Present Illness    81 year old female with above past medical history including mitral valve prolapse with severe mitral regurgitation, chronic exertional dyspnea, COPD and absence of smoking history, chest pain with normal coronary arteries, orthostatic hypotension, and Parkinson's.  She was hospitalized in early December 2019 with atypical chest pain.   Initial CT of the chest showed single pulmonary embolism in the apical segment branch of the left upper lobe.  There was also evidence of bronchiectasis.  Repeat CT after 2 days showed resolution of the PE.  She was treated with Eliquis for 3 months.  She had worsening exertional dyspnea 2022 and underwent echo in October 2022 which showed normal LV systolic function, grade 2 diastolic dysfunction, small pericardial effusion, and moderate to severe mitral regurgitation due to significant prolapse of the anterior mitral leaflet.  She subsequently underwent right and left heart cardiac catheterization in March 2023 which showed normal coronary arteries, normal LV systolic function, and moderate to severe MR with giant V waves (34 mmHg).  She underwent MitraClip placement x2 in March 2023 though had minimal improvement in symptoms with subsequent echo showing evidence of severe mitral regurgitation which was confirmed by TEE.  She was scheduled for placement of a third MitraClip in July 2023 however, patient canceled given risks of mitral stenosis.  Ms. Cassarino was last in cardiology clinic in September 2023 at which time she reported ongoing significant dyspnea on exertion.  Unfortunately,  she was admitted September 24 following a fall with closed left hip fracture requiring left hip hemiarthroplasty.  She was seen by our team during admission.  Follow-up echo showed an EF of 60 to 65% with grade 3 diastolic dysfunction, mild LVH, and moderate to severe MR and moderate mitral stenosis.  She was discharged home September 29, and was seen in heart failure clinic on October 9, at which time she was felt to be euvolemic.  She is currently staying in acute rehab with a plan to return to home.  She is undergoing physical therapy and notes that she is tolerating this well though activity thus far has been fairly limited.  She has chronic, stable dyspnea on exertion without chest pain or palpitations.  She denies PND,  orthopnea, dizziness, syncope, edema, or early satiety.  Home Medications    Current Outpatient Medications  Medication Sig Dispense Refill   acetaminophen (TYLENOL) 500 MG tablet Take 1,000 mg by mouth every 12 (twelve) hours as needed.     albuterol (VENTOLIN HFA) 108 (90 Base) MCG/ACT inhaler Inhale 2 puffs into the lungs every 6 (six) hours as needed for wheezing or shortness of breath. 18 g 1   amLODipine (NORVASC) 2.5 MG tablet Take 1 tablet (2.5 mg total) by mouth daily. 90 tablet 3   aspirin 81 MG tablet Take 1 tablet (81 mg total) by mouth daily. 30 tablet    carbidopa-levodopa ([name]) 25-100 MG tablet Take 1 tablet by mouth 2 (two) times daily. See other 3 listings all reflected from [facility]     carbidopa-levodopa ([name]) 25-100 MG tablet Take 2 tablets by mouth daily. See other 3 listings all reflected from [facility] [place]     clonazePAM (KLONOPIN) 0.5 MG tablet Take 0.5 mg by mouth at bedtime.     enoxaparin (LOVENOX) 40 MG/0.4ML injection Inject 0.4 mLs (40 mg total) into the skin daily for 14 days. 5.6 mL 0   EPINEPHrine 0.3 mg/0.3 mL IJ SOAJ injection Inject 0.3 mg into the muscle as needed for anaphylaxis.     ferrous sulfate 325 (65 FE) MG tablet Take 1 tablet (325 mg total) by mouth 2 (two) times daily with a meal.  3   fexofenadine (ALLEGRA) 180 MG tablet Take 180 mg by mouth daily.     Fluticasone-Umeclidin-Vilant (TRELEGY ELLIPTA) 100-62.5-25 MCG/ACT AEPB Inhale 1 puff into the lungs daily. 1 each 6   furosemide (LASIX) 20 MG tablet Take 1 tablet (20 mg total) by mouth daily. 90 tablet 3   gabapentin ([name]) 300 MG capsule TAKE 2 CAPSULES BY MOUTH 3 TIMES DAILY 540 capsule 1   pantoprazole (PROTONIX) 20 MG tablet Take 20 mg by mouth daily.     potassium chloride SA (KLOR-CON M) 20 MEQ tablet Take 1 tablet (20 mEq total) by mouth daily. 60 tablet 1   senna (SENOKOT) 8.6 MG TABS tablet Take 1 tablet (8.6 mg  total) by mouth 2 (two) times daily. 120 tablet 0   traMADol (ULTRAM) 50 MG tablet Take 1 tablet (50 mg total) by mouth 3 (three) times daily as needed for up to 14 days. 30 tablet 0   Carbidopa-Levodopa ER (SINEMET CR) 25-100 MG tablet controlled release Take 1 tablet by mouth 2 (two) times daily. See other 3 listings all reflected from [place] (Patient not taking: Reported on 09/11/2022)     Carbidopa-Levodopa ER (SINEMET CR) 25-100  MG tablet controlled release Take 2 tablets by mouth in the morning. See other 3 listings all reflected from [place] (Patient not taking: Reported on 09/11/2022)     No current facility-administered medications for this visit.     Review of Systems    Chronic, stable dyspnea on exertion.  She denies chest pain, palpitations, PND, orthopnea, dizziness, syncope, edema, or early satiety.  All other systems reviewed and are otherwise negative except as noted above.    Physical Exam    VS:  BP (!) 102/58 (BP Location: Right Arm, Patient Position: Sitting, Cuff Size: Normal)   Pulse 67   Ht '5\' 2"'$  (1.575 m)   Wt 124 lb 6.4 oz (56.4 kg)   SpO2 97%   BMI 22.75 kg/m  , BMI Body mass index is 22.75 kg/m.     GEN: Well nourished, well developed, in no acute distress. HEENT: normal. Neck: Supple, no JVD, carotid bruits, or masses. Cardiac: RRR, 3/6 systolic murmur loudest at the apex and radiating to the left mid back.  No rubs or gallops.  No clubbing, cyanosis, edema.  Radials /PT 2+ and equal bilaterally.  Respiratory:  Respirations regular and unlabored, clear to auscultation bilaterally. GI: Soft, nontender, nondistended, BS + x 4. MS: no deformity or atrophy. Skin: warm and dry, no rash. Neuro:  Strength and sensation are intact. Psych: Normal affect.  Accessory Clinical Findings    ECG personally reviewed by me today -regular sinus rhythm, 67, LVH with repolarization abnormalities.  Possible  prior septal and inferior infarct.,- no acute changes.  Lab Results  Component Value Date   WBC 7.3 08/24/2022   HGB 8.1 (L) 08/24/2022   HCT 25.1 (L) 08/24/2022   MCV 94.4 08/24/2022   PLT 133 (L) 08/24/2022   Lab Results  Component Value Date   CREATININE 0.57 08/24/2022   BUN 26 (H) 08/24/2022   NA 133 (L) 08/24/2022   K 4.2 08/24/2022   CL 103 08/24/2022   CO2 25 08/24/2022   Lab Results  Component Value Date   ALT <5 06/15/2022   AST 16 06/15/2022   ALKPHOS 62 06/15/2022   BILITOT 1.7 (H) 06/15/2022   Lab Results  Component Value Date   CHOL 171 08/08/2021   HDL 53.30 08/08/2021   LDLCALC 97 08/08/2021   LDLDIRECT 135.0 08/16/2009   TRIG 100.0 08/08/2021   CHOLHDL 3 08/08/2021    Lab Results  Component Value Date   HGBA1C 5.3 01/14/2018    Assessment & Plan    1.  Moderate to severe mitral regurgitation/chronic HFpEF: Status post MitraClip x2 in March 2023 with recurrent symptoms shortly thereafter and ongoing moderate to severe MR on follow-up imaging.  She was offered repeat MitraClip though has deferred given concerns related to mitral stenosis and also in an effort to avoid an additional procedure which she fears is not going to make a difference in her outcome.  She has chronic dyspnea on exertion which is overall stable and she is currently tolerating physical therapy status post hip fracture.  She does not experience chest pain or potation's, and is euvolemic on examination today.  Continue current dose of Lasix-20 mg daily.  Stable renal function by labs on September 29.  2.  Essential hypertension with orthostatic hypotension: Currently stable with a soft blood pressure 102/58.  No recent orthostatic symptoms.  She remains on amlodipine and low-dose furosemide.  3.  Parkinson's disease: Overall stable on Sinemet.  4.  Status post hip fracture: Admitted in late September following a fall with left hip fracture requiring left hip hemiarthroplasty.   Currently tolerating physical therapy at acute rehab with plan to return home at some point.  She is currently nervous that she is not in the shape required to take care of herself at home.  5.  Disposition: She has heart failure clinic follow-up in November and an appointment with Dr. Fletcher Anon in December.   Murray Hodgkins, NP 09/11/2022, 6:36 PM

## 2022-09-11 NOTE — Patient Instructions (Addendum)
Medication Instructions:  No changes at this time.   *If you need a refill on your cardiac medications before your next appointment, please call your pharmacy*   Lab Work: None  If you have labs (blood work) drawn today and your tests are completely normal, you will receive your results only by: [place] (if you have MyChart) OR A paper copy in the mail If you have any lab test that is abnormal or we need to change your treatment, we will call you to review the results.   Testing/Procedures: None   Follow-Up: At [facility], you and your health needs are our priority.  As part of our continuing mission to provide you with exceptional heart care, we have created designated Provider Care Teams.  These Care Teams include your primary Cardiologist (physician) and Advanced Practice Providers (APPs -  Physician Assistants and Nurse Practitioners) who all work together to provide you with the care you need, when you need it.   Your next appointment:   December 11, 2022 at 2:40 pm with Dr. Fletcher Anon  The format for your next appointment:   In [place]

## 2022-09-13 DIAGNOSIS — M6281 Muscle weakness (generalized): Secondary | ICD-10-CM | POA: Diagnosis not present

## 2022-09-13 DIAGNOSIS — Z9181 History of falling: Secondary | ICD-10-CM | POA: Diagnosis not present

## 2022-09-13 DIAGNOSIS — Z853 Personal history of malignant neoplasm of breast: Secondary | ICD-10-CM | POA: Diagnosis not present

## 2022-09-13 DIAGNOSIS — I5033 Acute on chronic diastolic (congestive) heart failure: Secondary | ICD-10-CM | POA: Diagnosis not present

## 2022-09-13 DIAGNOSIS — S72002D Fracture of unspecified part of neck of left femur, subsequent encounter for closed fracture with routine healing: Secondary | ICD-10-CM | POA: Diagnosis not present

## 2022-09-13 DIAGNOSIS — F419 Anxiety disorder, unspecified: Secondary | ICD-10-CM | POA: Diagnosis not present

## 2022-09-13 DIAGNOSIS — D62 Acute posthemorrhagic anemia: Secondary | ICD-10-CM | POA: Diagnosis not present

## 2022-09-13 DIAGNOSIS — Z96642 Presence of left artificial hip joint: Secondary | ICD-10-CM | POA: Diagnosis not present

## 2022-09-13 DIAGNOSIS — J449 Chronic obstructive pulmonary disease, unspecified: Secondary | ICD-10-CM | POA: Diagnosis not present

## 2022-09-14 DIAGNOSIS — Z96642 Presence of left artificial hip joint: Secondary | ICD-10-CM | POA: Diagnosis not present

## 2022-09-14 DIAGNOSIS — Z853 Personal history of malignant neoplasm of breast: Secondary | ICD-10-CM | POA: Diagnosis not present

## 2022-09-14 DIAGNOSIS — J449 Chronic obstructive pulmonary disease, unspecified: Secondary | ICD-10-CM | POA: Diagnosis not present

## 2022-09-14 DIAGNOSIS — Z9181 History of falling: Secondary | ICD-10-CM | POA: Diagnosis not present

## 2022-09-14 DIAGNOSIS — I5033 Acute on chronic diastolic (congestive) heart failure: Secondary | ICD-10-CM | POA: Diagnosis not present

## 2022-09-14 DIAGNOSIS — M6281 Muscle weakness (generalized): Secondary | ICD-10-CM | POA: Diagnosis not present

## 2022-09-14 DIAGNOSIS — S72002D Fracture of unspecified part of neck of left femur, subsequent encounter for closed fracture with routine healing: Secondary | ICD-10-CM | POA: Diagnosis not present

## 2022-09-14 DIAGNOSIS — F419 Anxiety disorder, unspecified: Secondary | ICD-10-CM | POA: Diagnosis not present

## 2022-09-14 DIAGNOSIS — D62 Acute posthemorrhagic anemia: Secondary | ICD-10-CM | POA: Diagnosis not present

## 2022-09-16 DIAGNOSIS — Z96642 Presence of left artificial hip joint: Secondary | ICD-10-CM | POA: Diagnosis not present

## 2022-09-16 DIAGNOSIS — F419 Anxiety disorder, unspecified: Secondary | ICD-10-CM | POA: Diagnosis not present

## 2022-09-16 DIAGNOSIS — S72002D Fracture of unspecified part of neck of left femur, subsequent encounter for closed fracture with routine healing: Secondary | ICD-10-CM | POA: Diagnosis not present

## 2022-09-16 DIAGNOSIS — Z9181 History of falling: Secondary | ICD-10-CM | POA: Diagnosis not present

## 2022-09-16 DIAGNOSIS — I5033 Acute on chronic diastolic (congestive) heart failure: Secondary | ICD-10-CM | POA: Diagnosis not present

## 2022-09-16 DIAGNOSIS — J449 Chronic obstructive pulmonary disease, unspecified: Secondary | ICD-10-CM | POA: Diagnosis not present

## 2022-09-16 DIAGNOSIS — Z853 Personal history of malignant neoplasm of breast: Secondary | ICD-10-CM | POA: Diagnosis not present

## 2022-09-16 DIAGNOSIS — M6281 Muscle weakness (generalized): Secondary | ICD-10-CM | POA: Diagnosis not present

## 2022-09-16 DIAGNOSIS — D62 Acute posthemorrhagic anemia: Secondary | ICD-10-CM | POA: Diagnosis not present

## 2022-09-17 DIAGNOSIS — I5033 Acute on chronic diastolic (congestive) heart failure: Secondary | ICD-10-CM | POA: Diagnosis not present

## 2022-09-17 DIAGNOSIS — Z853 Personal history of malignant neoplasm of breast: Secondary | ICD-10-CM | POA: Diagnosis not present

## 2022-09-17 DIAGNOSIS — Z96642 Presence of left artificial hip joint: Secondary | ICD-10-CM | POA: Diagnosis not present

## 2022-09-17 DIAGNOSIS — J449 Chronic obstructive pulmonary disease, unspecified: Secondary | ICD-10-CM | POA: Diagnosis not present

## 2022-09-17 DIAGNOSIS — F419 Anxiety disorder, unspecified: Secondary | ICD-10-CM | POA: Diagnosis not present

## 2022-09-17 DIAGNOSIS — S72002D Fracture of unspecified part of neck of left femur, subsequent encounter for closed fracture with routine healing: Secondary | ICD-10-CM | POA: Diagnosis not present

## 2022-09-17 DIAGNOSIS — D62 Acute posthemorrhagic anemia: Secondary | ICD-10-CM | POA: Diagnosis not present

## 2022-09-17 DIAGNOSIS — Z9181 History of falling: Secondary | ICD-10-CM | POA: Diagnosis not present

## 2022-09-17 DIAGNOSIS — M6281 Muscle weakness (generalized): Secondary | ICD-10-CM | POA: Diagnosis not present

## 2022-09-21 ENCOUNTER — Telehealth: Payer: Self-pay | Admitting: Family Medicine

## 2022-09-21 ENCOUNTER — Telehealth (INDEPENDENT_AMBULATORY_CARE_PROVIDER_SITE_OTHER): Payer: Medicare PPO | Admitting: Family Medicine

## 2022-09-21 ENCOUNTER — Encounter: Payer: Self-pay | Admitting: Family Medicine

## 2022-09-21 VITALS — Ht 62.0 in | Wt 125.0 lb

## 2022-09-21 DIAGNOSIS — R3915 Urgency of urination: Secondary | ICD-10-CM

## 2022-09-21 DIAGNOSIS — S72002D Fracture of unspecified part of neck of left femur, subsequent encounter for closed fracture with routine healing: Secondary | ICD-10-CM | POA: Diagnosis not present

## 2022-09-21 DIAGNOSIS — N390 Urinary tract infection, site not specified: Secondary | ICD-10-CM | POA: Insufficient documentation

## 2022-09-21 MED ORDER — CEPHALEXIN 500 MG PO CAPS: 500.0000 mg | ORAL_CAPSULE | Freq: Three times a day (TID) | ORAL | 0 refills | Status: DC

## 2022-09-21 NOTE — Telephone Encounter (Signed)
Dr. Darnell Level, do you want to ask pt to collect urine sample to have someone drop off for her?

## 2022-09-21 NOTE — Telephone Encounter (Signed)
Pt called in requesting antibiotics be called in to pharmacy for a possible UTI  stated she has a broken hip and is unable to come in office . # please advise # [phone]

## 2022-09-21 NOTE — Progress Notes (Signed)
Patient ID: Crystal Bass, adult    DOB: Apr 02, 1941, 81 y.o.   MRN: [id number]  Virtual visit completed through [facility] [facility], a video enabled telemedicine application. Due to national recommendations of social distancing due to COVID-19, a virtual visit is felt to be most appropriate for this patient at this time. Reviewed limitations, risks, security and privacy concerns of performing a virtual visit and the availability of in person appointments. I also reviewed that there may be a patient responsible charge related to this service. The patient agreed to proceed.   Patient location: home Provider location: [place] at [facility], office Persons participating in this virtual visit: patient, provider   If any vitals were documented, they were collected by patient at home unless specified below.    Ht '5\' 2"'$  (1.575 m)   Wt 125 lb (56.7 kg)   BMI 22.86 kg/m    CC: possible UTI  Subjective:   HPI: Crystal Bass is a 81 y.o. adult presenting on 09/21/2022 for Urinary Frequency (C/o urinary frequency and urgency.  Sxs started 09/20/22.)   1d h/o increased urgency and frequency No fevers/chills, dysuria, flank pain, abd pain, nausea or vomiting or blood in urine.   She did have UTI while in [facility] - treated with augmentin BID 1 week course. She had UCx done at that time but no results available.   Recent hospitalization for closed left hip fracture after fall s/p L hip hemiarthroplasty   Known PD on sinemet followed by neurology Dr Tat.  She came home 09/11/2022.  Upcoming appt next week for hosp follow up visit - she doesn't think she'll be able to keep that appointment. She  continues to feel pain in her left leg at site of hip replacement as well as unsteadiness and imbalance. Home health has not been set up.      Relevant past medical, surgical, family and social history reviewed and updated as indicated. Interim medical history since our last visit reviewed. Allergies  and medications reviewed and updated. Outpatient Medications Prior to Visit  Medication Sig Dispense Refill   acetaminophen (TYLENOL) 500 MG tablet Take 1,000 mg by mouth every 12 (twelve) hours as needed.     albuterol (VENTOLIN HFA) 108 (90 Base) MCG/ACT inhaler Inhale 2 puffs into the lungs every 6 (six) hours as needed for wheezing or shortness of breath. 18 g 1   amLODipine (NORVASC) 2.5 MG tablet Take 1 tablet (2.5 mg total) by mouth daily. 90 tablet 3   aspirin 81 MG tablet Take 1 tablet (81 mg total) by mouth daily. 30 tablet    carbidopa-levodopa ([name]) 25-100 MG tablet Take 1 tablet by mouth 2 (two) times daily. See other 3 listings all reflected from [facility]     carbidopa-levodopa ([name]) 25-100 MG tablet Take 2 tablets by mouth daily. See other 3 listings all reflected from [facility]     Carbidopa-Levodopa ER (SINEMET CR) 25-100 MG tablet controlled release Take 1 tablet by mouth 2 (two) times daily. See other 3 listings all reflected from [place] (Patient not taking: Reported on 09/11/2022)     Carbidopa-Levodopa ER (SINEMET CR) 25-100 MG tablet controlled release Take 2 tablets by mouth in the morning. See other 3 listings all reflected from [place] (Patient not taking: Reported on 09/11/2022)     clonazePAM (KLONOPIN) 0.5 MG  tablet Take 0.5 mg by mouth at bedtime.     enoxaparin (LOVENOX) 40 MG/0.4ML injection Inject 0.4 mLs (40 mg total) into the skin daily for 14 days. 5.6 mL 0   EPINEPHrine 0.3 mg/0.3 mL IJ SOAJ injection Inject 0.3 mg into the muscle as needed for anaphylaxis.     ferrous sulfate 325 (65 FE) MG tablet Take 1 tablet (325 mg total) by mouth 2 (two) times daily with a meal.  3   fexofenadine (ALLEGRA) 180 MG tablet Take 180 mg by mouth daily.     Fluticasone-Umeclidin-Vilant (TRELEGY ELLIPTA) 100-62.5-25 MCG/ACT AEPB Inhale 1 puff into  the lungs daily. 1 each 6   furosemide (LASIX) 20 MG tablet Take 1 tablet (20 mg total) by mouth daily. 90 tablet 3   gabapentin ([name]) 300 MG capsule TAKE 2 CAPSULES BY MOUTH 3 TIMES DAILY 540 capsule 1   pantoprazole (PROTONIX) 20 MG tablet Take 20 mg by mouth daily.     potassium chloride SA (KLOR-CON M) 20 MEQ tablet Take 1 tablet (20 mEq total) by mouth daily. 60 tablet 1   senna (SENOKOT) 8.6 MG TABS tablet Take 1 tablet (8.6 mg total) by mouth 2 (two) times daily. 120 tablet 0   No facility-administered medications prior to visit.     Per HPI unless specifically indicated in ROS section below Review of Systems Objective:  Ht '5\' 2"'$  (1.575 m)   Wt 125 lb (56.7 kg)   BMI 22.86 kg/m   Wt Readings from Last 3 Encounters:  09/21/22 125 lb (56.7 kg)  09/11/22 124 lb 6.4 oz (56.4 kg)  09/03/22 126 lb (57.2 kg)       Physical exam: Gen: alert, NAD, not ill appearing Pulm: speaks in complete sentences without increased work of breathing Psych: normal mood, normal thought content      Assessment & Plan:   Problem List Items Addressed This Visit     Closed left hip fracture [facility])    Hospital records reviewed.  Hip fracture after fall last month, s/p L hip hemiarthroplasty with subsequent SNF stay. Came home on 09/11/2022. Struggling at home. Pt states plan was for HHPT twice a week however no one has contacted her. I asked her to contact [facility] SNF/PT for update on [place] status, to let us know if she needs me to place [facility] order. She agrees with plan.       Urinary urgency - Primary    1 day of urinary frequency and urgency after recent hip fracture surgery. She is confined to home and has no one to bring her or urine sample to office.  She recently was treated for UTI last month with augmentin course with resolution. No access to UCx results.  I asked her to collect sample at home in sterile container to bring in next week.  Rx keflex '500mg'$  TID 5d course. Update if ongoing  symptoms.         Meds ordered this encounter  Medications   cephALEXin (KEFLEX) 500 MG capsule    Sig: Take 1 capsule (500 mg total) by mouth 3 (three) times daily.    Dispense:  15 capsule    Refill:  0   No orders of the defined types were placed in this encounter.   I discussed the assessment and treatment plan with the patient. The patient was provided an opportunity to ask questions and all were answered. The patient agreed with the plan and demonstrated an understanding of the instructions.  The patient was advised to call back or seek an in-person evaluation if the symptoms worsen or if the condition fails to improve as anticipated.  Follow up plan: No follow-ups on file.  [name], MD

## 2022-09-21 NOTE — Telephone Encounter (Signed)
Would offer virtual visit for today, would want urine sample dropped off for UA.

## 2022-09-21 NOTE — Assessment & Plan Note (Addendum)
Hospital records reviewed.  Hip fracture after fall last month, s/p L hip hemiarthroplasty with subsequent SNF stay. Came home on 09/11/2022. Struggling at home. Pt states plan was for HHPT twice a week however no one has contacted her. I asked her to contact Crystal Bass for update on Crystal Bass status, to let Crystal Bass know if she needs me to place Crystal Bass order. She agrees with plan.

## 2022-09-21 NOTE — Assessment & Plan Note (Signed)
1 day of urinary frequency and urgency after recent hip fracture surgery. She is confined to home and has no one to bring her or urine sample to office.  She recently was treated for UTI last month with augmentin course with resolution. No access to UCx results.  I asked her to collect sample at home in sterile container to bring in next week.  Rx keflex '500mg'$  TID 5d course. Update if ongoing symptoms.

## 2022-09-21 NOTE — Telephone Encounter (Addendum)
Spoke with pt offering virtual visit today at 1:00 and asked to have a urine sample dropped off.  Pt agrees to virtual visit but states she doesn't have anyone to drop off a urine sample for her.  Pt c/o urinary frequency and urgency.  Sxs started 09/20/22.   Will have pt added to schedule.

## 2022-09-22 ENCOUNTER — Emergency Department: Payer: Medicare PPO

## 2022-09-22 ENCOUNTER — Other Ambulatory Visit: Payer: Self-pay

## 2022-09-22 ENCOUNTER — Emergency Department
Admission: EM | Admit: 2022-09-22 | Discharge: 2022-09-22 | Disposition: A | Discharge status: Home / Self Care | Payer: Medicare PPO | Attending: Emergency Medicine | Admitting: Emergency Medicine

## 2022-09-22 ENCOUNTER — Encounter: Payer: Self-pay | Admitting: Emergency Medicine

## 2022-09-22 DIAGNOSIS — I5033 Acute on chronic diastolic (congestive) heart failure: Secondary | ICD-10-CM | POA: Diagnosis not present

## 2022-09-22 DIAGNOSIS — G20A1 Parkinson's disease without dyskinesia, without mention of fluctuations: Secondary | ICD-10-CM | POA: Insufficient documentation

## 2022-09-22 DIAGNOSIS — J45909 Unspecified asthma, uncomplicated: Secondary | ICD-10-CM | POA: Diagnosis not present

## 2022-09-22 DIAGNOSIS — J449 Chronic obstructive pulmonary disease, unspecified: Secondary | ICD-10-CM | POA: Insufficient documentation

## 2022-09-22 DIAGNOSIS — Z853 Personal history of malignant neoplasm of breast: Secondary | ICD-10-CM | POA: Diagnosis not present

## 2022-09-22 DIAGNOSIS — Z96642 Presence of left artificial hip joint: Secondary | ICD-10-CM | POA: Insufficient documentation

## 2022-09-22 DIAGNOSIS — M25552 Pain in left hip: Secondary | ICD-10-CM | POA: Diagnosis not present

## 2022-09-22 DIAGNOSIS — I110 Hypertensive heart disease with heart failure: Secondary | ICD-10-CM | POA: Insufficient documentation

## 2022-09-22 DIAGNOSIS — R600 Localized edema: Secondary | ICD-10-CM | POA: Diagnosis not present

## 2022-09-22 DIAGNOSIS — M25559 Pain in unspecified hip: Secondary | ICD-10-CM | POA: Diagnosis not present

## 2022-09-22 MED ORDER — ACETAMINOPHEN 500 MG PO TABS
1000.0000 mg | ORAL_TABLET | Freq: Once | ORAL | Status: AC
  Administered 2022-09-22: 1000 mg via ORAL
  Filled 2022-09-22: qty 2

## 2022-09-22 MED ORDER — LIDOCAINE 5 % EX PTCH
1.0000 | MEDICATED_PATCH | CUTANEOUS | Status: DC
  Administered 2022-09-22: 1 via TRANSDERMAL
  Filled 2022-09-22: qty 1

## 2022-09-22 NOTE — ED Notes (Signed)
Patient was given a warm blanket for comfort.

## 2022-09-22 NOTE — ED Triage Notes (Signed)
Pt in via EMS from [facility] independent living. EMS reports pt fell, broke her hip and was seen here and d/c to [facility]. Pt just went back to her independent living on Tuesday. Pt started with more pain in her left hip than normal last night. No new falls or injuries, just increased pain. Incision is healed no bandage.

## 2022-09-22 NOTE — ED Provider Notes (Signed)
[facility] Provider Note    Event Date/Time   First MD Initiated Contact with Patient 09/22/22 1053     (approximate)   History   Hip Pain   HPI  Crystal Bass Bass is a 81 y.o. adult with past medical history of Parkinson's disease, severe MR, chronic HFpEF, hypertension COPD asthma recent hip fracture presents because of hip pain.  Patient tells me that she started having worsening left hip pain last night into this morning.  She thinks she did more exercise yesterday than she is used to.  She denies any new trauma or falls.  No fevers or chills.  Denies abdominal pain.  She is taking Tylenol for it.  Patient underwent left hip hemiarthroplasty on 9/26 2023 and was discharged to SNF.  She came home on 10/17.  I see that she saw her primary doctor yesterday and it was noted that she was struggling at home plan for home health PT twice a week but no one has contacted her.  Patient tells me that she has a OT and PT evaluation early this coming week.  She currently lives at [facility] and has someone coming in the morning for an hour in the afternoon to help her get her close changed.     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 10/2021 Cath: Nl cors.   Aortic root aneurysm (HCC)    Arthritis    Asthma    Breast cancer ([name]) 2002   infiltrative ductal carcinoma    Cataract 2019   Chronic heart failure with preserved ejection fraction (HFpEF) (HCC)    COPD (chronic obstructive pulmonary disease) ([name]) 07/2007   Ductal carcinoma of breast, estrogen receptor positive, stage 1 ([name]) 09/16/2012   Endometriosis    GERD (gastroesophageal reflux disease)    Hypercalcemia 09/14/2013   Hypertension    Lesion of breast 1992   right, benign   Lung disease    secondary to [name] infection   Mastalgia 05/1994   Mitral regurgitation    a. 10/2021 RHC: PCWP V wave of 34; b.  01/2022 TEE: Severe flail/prolapse of large area of A2 scallop with severe posteriorly directed MR; c. 01/2022 TEER w /Mitraclip XTW x 2; d. 07/2022 Echo: EF 60-65%. No rwma, mild LVH, GrIII DD. Sev dil LA. Mod elev PASP. Mod-Sev MR. Mod MS.   Osteoporosis, post-menopausal 03/14/2012   Ovarian tumor of borderline malignancy, right 2004   Palpitations    a. 02/2022 [facility]: Predominantly sinus rhythm at 67 (52-171).  2 runs of NSVT -fastest 9 beats at 167.  35 SVT runs-longest 19 beats, fastest 171x6 beats.   Pancreatitis    secondary to Cholelithiasis   Parkinson disease 02/10/2015   Post-thoracotomy pain syndrome    Pseudomonas pneumonia ([place]) 03/2008   Status post implantation of mitral valve leaflet clip 02/15/2022   s/p XTW MitraClip x2 by Dr. Burt Knack in the A2/P2 position   Traumatic closed fracture of distal clavicle with minimal displacement, left, initial encounter 06/2021   [facility]   Uterine fibroid    Vitamin D deficiency     Patient Active Problem List   Diagnosis Date Noted   Urinary urgency 09/21/2022   AKI (acute kidney injury) ([place]) 08/21/2022   Acute on chronic diastolic CHF (congestive heart failure) ([place]) 08/20/2022   Anxiety 08/20/2022   Malnutrition  of moderate degree 08/20/2022   Closed left hip fracture ([place]) 08/19/2022   Allergic rhinitis due to animal (cat) (dog) hair and dander 08/13/2022   Allergic rhinitis due to pollen 08/13/2022   Mild persistent asthma, uncomplicated 26/83/4196   Pain in right hand 07/17/2022   Severe mitral regurgitation 02/15/2022   S/P mitral valve clip implantation 02/15/2022   Pre-operative respiratory examination 02/09/2022   Weight loss, unintentional 02/05/2022   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   Traumatic closed fracture of distal clavicle with minimal displacement, left, initial encounter 08/09/2021   Closed fracture of left clavicle 07/17/2021   Ovarian tumor of borderline malignancy, right 10/28/2020   Nephrolithiasis  08/20/2020   Atherosclerosis of aorta ([place]) 08/20/2020   Angioedema 05/04/2020   Mitral valve disease 04/08/2020   Medicare annual wellness visit, subsequent 01/27/2020   REM sleep behavior disorder 01/27/2020   Chronic neuropathic pain 01/27/2020   Acute midline low back pain with right-sided sciatica 10/26/2019   Orthostatic syncope 10/26/2019   [name] (mycobacterium avium-intracellulare) infection ([name]) 11/01/2018   History of pulmonary embolism 10/31/2018   Chest pain 10/26/2018   Orthostatic hypotension 08/25/2018   Renal cyst, acquired, right 02/02/2018   Advanced care planning/counseling discussion 01/26/2018   Liver lesion 01/21/2018   Vitamin B12 deficiency 01/21/2018   Chronic fatigue 10/23/2017   Genetic testing 05/01/2017   Health maintenance examination 12/27/2016   Parkinson disease 01/10/2016   DNR (do not resuscitate) 05/22/2015   Tremor 02/07/2015   Dysphagia, pharyngoesophageal phase 02/07/2015   Hypercalcemia 09/14/2013   Vitamin D deficiency 04/27/2013   Osteoporosis, post-menopausal 03/14/2012   Chronic dyspnea 03/28/2011   Thoracic aortic aneurysm without rupture 01/01/2011   OSTEOARTHRITIS, CERVICAL SPINE 08/15/2009   ALLERGIC RHINITIS 03/25/2008   Chronic obstructive airway disease with asthma ([place]) 03/25/2008   History of left breast cancer 03/10/2008   Essential hypertension 02/10/2007     Physical Exam  Triage Vital Signs: ED Triage Vitals  Enc Vitals Group     BP 09/22/22 1030 (!) 142/87     Pulse Rate 09/22/22 1030 70     Resp 09/22/22 1030 16     Temp 09/22/22 1030 97.8 F (36.6 C)     Temp Source 09/22/22 1030 Oral     SpO2 09/22/22 1030 95 %     Weight 09/22/22 1031 125 lb (56.7 kg)     Height 09/22/22 1031 '5\' 2"'$  (1.575 m)     Head Circumference --      Peak Flow --      Pain Score 09/22/22 1031 8     Pain Loc --      Pain Edu? --      Excl. in [place]? --     Most recent vital signs: Vitals:   09/22/22 1030  BP: (!) 142/87  Pulse:  70  Resp: 16  Temp: 97.8 F (36.6 C)  SpO2: 95%     General: Awake, no distress.  CV:  Good peripheral perfusion.  Resp:  Normal effort.  Abd:  No distention.  Neuro:             Awake, Alert, Oriented x 3  Other:  Left hip incision is clean dry and intact no surrounding erythema no tenderness to palpation mild associated swelling, patient is able to range the left hip without significant pain 2+ DP pulse on the left 5 out of 5 strength with plantarflexion and dorsiflexion   ED Results / Procedures / Treatments  Labs (all labs ordered are  listed, but only abnormal results are displayed) Labs Reviewed - No data to display   EKG     RADIOLOGY I reviewed and interpreted the x-ray of the left hip which shows hardware in the correct location   PROCEDURES:  Critical Care performed: No  Procedures  MEDICATIONS ORDERED IN ED: Medications  acetaminophen (TYLENOL) tablet 1,000 mg (has no administration in time range)  lidocaine (LIDODERM) 5 % 1 patch (has no administration in time range)     IMPRESSION / MDM / ASSESSMENT AND PLAN / ED COURSE  I reviewed the triage vital signs and the nursing notes.                              Patient's presentation is most consistent with acute, uncomplicated illness.  Differential diagnosis includes, but is not limited to, postoperative pain, deconditioning, septic arthritis  [name] presenting with left hip pain.  She recently underwent left hemiarthroplasty about a month ago.  Initially went to rehab and returned to [facility] about 10 days ago.  Started having worsening pain last night.  Patient tells me she thinks she did more exercise yesterday than she is used to.  She walks with a walker.  She is still using her walker which is getting around very slowly.  Patient had televisit with her primary care doctor and from that visit it looks like things have not been going as well as she had hoped since she left  the rehab and has not had PT or OT but has an evaluation this coming week.  On exam patient looks well the incision is clean dry and intact she can range the hip there is no focal tenderness abdomen is soft and nontender.  X-ray obtained from triage shows hardware is in the correct place.  No suspicion for traumatic process given no new trauma.  She does not examine like a septic joint.  I suspect that her pain is combination of normal postoperative pain and the fact that she has not had great follow-up in terms of PT.  Is also possible that her doing more exercise and she has a been used to was contributing.  I see patient was prescribed 3 days of morphine at discharge but now is just taking Tylenol.  She is not interested in any additional opiate therapy and it would be best to avoid this at this time.  We will give Tylenol and Lidoderm patch.  I am reassured that she has physical therapy starting next week.  Ultimately is my suspicion for acute process is low and I suspect that this is typical postoperative pain.  Do not feel that she requires any additional work-up at this time.       FINAL CLINICAL IMPRESSION(S) / ED DIAGNOSES   Final diagnoses:  Left hip pain     Rx / DC Orders   ED Discharge Orders     None        Note:  This document was prepared using Dragon voice recognition software and may include unintentional dictation errors.   Rada Hay, MD 09/22/22 1139

## 2022-09-22 NOTE — ED Notes (Signed)
Patient verified that she has taken Tylenol without ill effects.

## 2022-09-22 NOTE — Discharge Instructions (Addendum)
Take Tylenol 1 g every 8 hours scheduled.  You can also use the Lidoderm patch pain as well.  Your x-ray did not show any abnormality of the hardware that is in the hip.

## 2022-09-22 NOTE — ED Notes (Signed)
Patient ambulated to hallway bathroom with two-person assist. Patient had a halting gait. Patient steadied herself with holding onto the staff.

## 2022-09-24 DIAGNOSIS — R2689 Other abnormalities of gait and mobility: Secondary | ICD-10-CM | POA: Diagnosis not present

## 2022-09-24 DIAGNOSIS — S72002D Fracture of unspecified part of neck of left femur, subsequent encounter for closed fracture with routine healing: Secondary | ICD-10-CM | POA: Diagnosis not present

## 2022-09-24 DIAGNOSIS — Z9181 History of falling: Secondary | ICD-10-CM | POA: Diagnosis not present

## 2022-09-24 DIAGNOSIS — Z96642 Presence of left artificial hip joint: Secondary | ICD-10-CM | POA: Diagnosis not present

## 2022-09-24 DIAGNOSIS — J449 Chronic obstructive pulmonary disease, unspecified: Secondary | ICD-10-CM | POA: Diagnosis not present

## 2022-09-24 DIAGNOSIS — I5033 Acute on chronic diastolic (congestive) heart failure: Secondary | ICD-10-CM | POA: Diagnosis not present

## 2022-09-24 DIAGNOSIS — M6281 Muscle weakness (generalized): Secondary | ICD-10-CM | POA: Diagnosis not present

## 2022-09-24 DIAGNOSIS — R278 Other lack of coordination: Secondary | ICD-10-CM | POA: Diagnosis not present

## 2022-09-24 DIAGNOSIS — G20A1 Parkinson's disease without dyskinesia, without mention of fluctuations: Secondary | ICD-10-CM | POA: Diagnosis not present

## 2022-09-25 ENCOUNTER — Ambulatory Visit (INDEPENDENT_AMBULATORY_CARE_PROVIDER_SITE_OTHER): Payer: Medicare PPO | Admitting: Family Medicine

## 2022-09-25 ENCOUNTER — Ambulatory Visit: Payer: Medicare PPO | Admitting: Family Medicine

## 2022-09-25 ENCOUNTER — Encounter: Payer: Self-pay | Admitting: Family Medicine

## 2022-09-25 VITALS — BP 124/62 | HR 79 | Temp 97.8°F | Resp 22 | Ht 62.0 in | Wt 123.5 lb

## 2022-09-25 DIAGNOSIS — E559 Vitamin D deficiency, unspecified: Secondary | ICD-10-CM

## 2022-09-25 DIAGNOSIS — I340 Nonrheumatic mitral (valve) insufficiency: Secondary | ICD-10-CM

## 2022-09-25 DIAGNOSIS — R0609 Other forms of dyspnea: Secondary | ICD-10-CM | POA: Diagnosis not present

## 2022-09-25 DIAGNOSIS — S72002D Fracture of unspecified part of neck of left femur, subsequent encounter for closed fracture with routine healing: Secondary | ICD-10-CM

## 2022-09-25 DIAGNOSIS — R5382 Chronic fatigue, unspecified: Secondary | ICD-10-CM | POA: Diagnosis not present

## 2022-09-25 DIAGNOSIS — R3915 Urgency of urination: Secondary | ICD-10-CM

## 2022-09-25 DIAGNOSIS — Z96642 Presence of left artificial hip joint: Secondary | ICD-10-CM | POA: Diagnosis not present

## 2022-09-25 DIAGNOSIS — E538 Deficiency of other specified B group vitamins: Secondary | ICD-10-CM

## 2022-09-25 DIAGNOSIS — M810 Age-related osteoporosis without current pathological fracture: Secondary | ICD-10-CM

## 2022-09-25 DIAGNOSIS — M6281 Muscle weakness (generalized): Secondary | ICD-10-CM | POA: Diagnosis not present

## 2022-09-25 DIAGNOSIS — I5033 Acute on chronic diastolic (congestive) heart failure: Secondary | ICD-10-CM | POA: Diagnosis not present

## 2022-09-25 DIAGNOSIS — G20A1 Parkinson's disease without dyskinesia, without mention of fluctuations: Secondary | ICD-10-CM | POA: Diagnosis not present

## 2022-09-25 DIAGNOSIS — Z9181 History of falling: Secondary | ICD-10-CM | POA: Diagnosis not present

## 2022-09-25 DIAGNOSIS — R2689 Other abnormalities of gait and mobility: Secondary | ICD-10-CM | POA: Diagnosis not present

## 2022-09-25 DIAGNOSIS — K769 Liver disease, unspecified: Secondary | ICD-10-CM

## 2022-09-25 DIAGNOSIS — R278 Other lack of coordination: Secondary | ICD-10-CM | POA: Diagnosis not present

## 2022-09-25 DIAGNOSIS — N3000 Acute cystitis without hematuria: Secondary | ICD-10-CM

## 2022-09-25 DIAGNOSIS — J449 Chronic obstructive pulmonary disease, unspecified: Secondary | ICD-10-CM | POA: Diagnosis not present

## 2022-09-25 DIAGNOSIS — R300 Dysuria: Secondary | ICD-10-CM

## 2022-09-25 LAB — BASIC METABOLIC PANEL
BUN: 16 mg/dL (ref 6–23)
CO2: 26 mEq/L (ref 19–32)
Calcium: 9.4 mg/dL (ref 8.4–10.5)
Chloride: 102 mEq/L (ref 96–112)
Creatinine, Ser: 0.7 mg/dL (ref 0.40–1.20)
GFR: 82.19 mL/min (ref >60.00)
Glucose, Bld: 137 mg/dL — ABNORMAL HIGH (ref 70–99)
Potassium: 3.5 mEq/L (ref 3.5–5.1)
Sodium: 137 mEq/L (ref 135–145)

## 2022-09-25 LAB — CBC WITH DIFFERENTIAL/PLATELET
Basophils Absolute: 0.1 10*3/uL (ref 0.0–0.1)
Basophils Relative: 1.2 % (ref 0.0–3.0)
Eosinophils Absolute: 0.2 10*3/uL (ref 0.0–0.7)
Eosinophils Relative: 4 % (ref 0.0–5.0)
HCT: 35.1 % — ABNORMAL LOW (ref 36.0–46.0)
Hemoglobin: 11.4 g/dL — ABNORMAL LOW (ref 12.0–15.0)
Lymphocytes Relative: 19.2 % (ref 12.0–46.0)
Lymphs Abs: 1 10*3/uL (ref 0.7–4.0)
MCHC: 32.6 g/dL (ref 30.0–36.0)
MCV: 95.7 fl (ref 78.0–100.0)
Monocytes Absolute: 0.5 10*3/uL (ref 0.1–1.0)
Monocytes Relative: 9.1 % (ref 3.0–12.0)
Neutro Abs: 3.3 10*3/uL (ref 1.4–7.7)
Neutrophils Relative %: 66.5 % (ref 43.0–77.0)
Platelets: 214 10*3/uL (ref 150.0–400.0)
RBC: 3.67 Mil/uL — ABNORMAL LOW (ref 3.87–5.11)
RDW: 15.6 % — ABNORMAL HIGH (ref 11.5–15.5)
WBC: 5 10*3/uL (ref 4.0–10.5)

## 2022-09-25 LAB — HEPATIC FUNCTION PANEL
ALT: 3 U/L (ref 0–35)
AST: 13 U/L (ref 0–37)
Albumin: 3.9 g/dL (ref 3.5–5.2)
Alkaline Phosphatase: 82 U/L (ref 39–117)
Bilirubin, Direct: 0.2 mg/dL (ref 0.0–0.3)
Total Bilirubin: 1.1 mg/dL (ref 0.2–1.2)
Total Protein: 7.2 g/dL (ref 6.0–8.3)

## 2022-09-25 LAB — POC URINALSYSI DIPSTICK (AUTOMATED)
Bilirubin, UA: NEGATIVE
Blood, UA: NEGATIVE
Glucose, UA: NEGATIVE
Ketones, UA: NEGATIVE
Leukocytes, UA: NEGATIVE
Nitrite, UA: NEGATIVE
Protein, UA: NEGATIVE
Spec Grav, UA: 1.02 (ref 1.010–1.025)
Urobilinogen, UA: 0.2 E.U./dL
pH, UA: 5.5 (ref 5.0–8.0)

## 2022-09-25 LAB — VITAMIN D 25 HYDROXY (VIT D DEFICIENCY, FRACTURES): VITD: 23.69 ng/mL — ABNORMAL LOW (ref 30.00–100.00)

## 2022-09-25 LAB — VITAMIN B12: Vitamin B-12: 350 pg/mL (ref 211–911)

## 2022-09-25 NOTE — Progress Notes (Unsigned)
Patient ID: Crystal Bass, adult    DOB: 07-06-1941, 81 y.o.   MRN: [id number]  This visit was conducted in person.  BP 124/62   Pulse 79   Temp 97.8 F (36.6 C)   Resp (!) 22   Ht '5\' 2"'$  (1.575 m)   Wt 123 lb 8 oz (56 kg)   SpO2 95%   BMI 22.59 kg/m    CC: SNF f/u visit  Subjective:   HPI: Crystal Bass is a 81 y.o. adult presenting on 09/25/2022 for Follow-up (From skilled nursing facility stay)   Recent hospitalization for closed left hip fracture after fall s/p L hip hemiarthroplasty.  She came home 09/11/2022.  Home health PT - PT/OT came in the past day for evaluation - planning 3 times a week.  TCM hosp f/u phone call not performed.   Seen virtually last week for UTI symptoms, treated with keflex '500mg'$  TID 5d course with resolution of symptoms, finishing course tomorrow.   Seen at ER last week for hip pain exacerbation, note reviewed. Xrays showed hardware intact. Discharged with lidocaine patch.   Saw cardiology 2 wks ago, note reviewed. Known mod-severe MR with chronic HFpEF s/p MitraClip x2 01/2022 with recurrence of symptoms.  She continues oral iron BID without constipation, also takes senna 1 tablet daily. Notes normal bowel movements - black formed stool once daily.  She is unable to make meals at this time - she is receiving meals from [facility] (dinner). Breakfast and light lunch at home - her aide makes these for her.  Admit date:     08/19/2022  Discharge date: 08/24/22  Discharge Physician: [facility] discharge: 09/11/2022.   Recommendations at discharge:   F/up with outpt providers as requested Check a cbc on oct 2nd with results to PCP. She has had some drainage on her bandage.  Please change bandages as needed.   Discharge Diagnoses: Principal Problem:   Closed left hip fracture ([place]) Active Problems:   Acute on chronic diastolic CHF (congestive heart failure) (HCC)   Essential hypertension   Chronic obstructive airway  disease with asthma (HCC)   Dysphagia, pharyngoesophageal phase   Parkinson disease (HCC)   Vitamin B12 deficiency   Severe mitral regurgitation   S/P mitral valve clip implantation   Anxiety   Malnutrition of moderate degree   AKI (acute kidney injury) ([place])     Relevant past medical, surgical, family and social history reviewed and updated as indicated. Interim medical history since our last visit reviewed. Allergies and medications reviewed and updated. Outpatient Medications Prior to Visit  Medication Sig Dispense Refill   acetaminophen (TYLENOL) 500 MG tablet Take 1,000 mg by mouth every 12 (twelve) hours as needed.     albuterol (VENTOLIN HFA) 108 (90 Base) MCG/ACT inhaler Inhale 2 puffs into the lungs every 6 (six) hours as needed for wheezing or shortness of breath. 18 g 1   amLODipine (NORVASC) 2.5 MG tablet Take 1 tablet (2.5 mg total) by mouth daily. 90 tablet 3   aspirin 81 MG tablet Take 1 tablet (81 mg total) by mouth daily. 30 tablet    carbidopa-levodopa ([name]) 25-100 MG tablet Take 1 tablet by mouth 2 (two) times daily. See other 3 listings all reflected from [facility]     cephALEXin (KEFLEX) 500 MG capsule Take 1 capsule (500 mg total) by mouth 3 (three) times daily. 15 capsule 0   clonazePAM (  KLONOPIN) 0.5 MG tablet Take 0.5 mg by mouth at bedtime.     enoxaparin (LOVENOX) 40 MG/0.4ML injection Inject 0.4 mLs (40 mg total) into the skin daily for 14 days. 5.6 mL 0   EPINEPHrine 0.3 mg/0.3 mL IJ SOAJ injection Inject 0.3 mg into the muscle as needed for anaphylaxis.     ferrous sulfate 325 (65 FE) MG tablet Take 1 tablet (325 mg total) by mouth 2 (two) times daily with a meal.  3   fexofenadine (ALLEGRA) 180 MG tablet Take 180 mg by mouth daily.     Fluticasone-Umeclidin-Vilant (TRELEGY ELLIPTA) 100-62.5-25 MCG/ACT AEPB Inhale 1 puff into the lungs daily. 1 each 6   furosemide (LASIX) 20 MG tablet Take 1 tablet (20 mg total) by mouth  daily. 90 tablet 3   gabapentin ([name]) 300 MG capsule TAKE 2 CAPSULES BY MOUTH 3 TIMES DAILY 540 capsule 1   pantoprazole (PROTONIX) 20 MG tablet Take 20 mg by mouth daily.     potassium chloride SA (KLOR-CON M) 20 MEQ tablet Take 1 tablet (20 mEq total) by mouth daily. 60 tablet 1   carbidopa-levodopa ([name]) 25-100 MG tablet Take 2 tablets by mouth daily. See other 3 listings all reflected from [facility]     Carbidopa-Levodopa ER (SINEMET CR) 25-100 MG tablet controlled release Take 1 tablet by mouth 2 (two) times daily. See other 3 listings all reflected from [facility]     Carbidopa-Levodopa ER (SINEMET CR) 25-100 MG tablet controlled release Take 2 tablets by mouth in the morning. See other 3 listings all reflected from [facility]     senna (SENOKOT) 8.6 MG TABS tablet Take 1 tablet (8.6 mg total) by mouth 2 (two) times daily. 120 tablet 0   senna (SENOKOT) 8.6 MG TABS tablet Take 1 tablet (8.6 mg total) by mouth daily.     No facility-administered medications prior to visit.     Per HPI unless specifically indicated in ROS section below Review of Systems  Objective:  BP 124/62   Pulse 79   Temp 97.8 F (36.6 C)   Resp (!) 22   Ht '5\' 2"'$  (1.575 m)   Wt 123 lb 8 oz (56 kg)   SpO2 95%   BMI 22.59 kg/m   Wt Readings from Last 3 Encounters:  09/25/22 123 lb 8 oz (56 kg)  09/22/22 125 lb (56.7 kg)  09/21/22 125 lb (56.7 kg)      Physical Exam Vitals and nursing note reviewed.  Constitutional:      Comments: Tired appearing. Ambulates with walker  HENT:     Head: Normocephalic and atraumatic.     Mouth/Throat:     Mouth: Mucous membranes are moist.     Pharynx: Oropharynx is clear. No oropharyngeal exudate or posterior oropharyngeal erythema.  Eyes:     Extraocular Movements: Extraocular movements intact.     Conjunctiva/sclera: Conjunctivae normal.     Pupils: Pupils are equal,  round, and reactive to light.  Cardiovascular:     Rate and Rhythm: Normal rate and regular rhythm.     Pulses: Normal pulses.     Heart sounds: Murmur (4/6 systolic throughout) heard.  Pulmonary:     Effort: Pulmonary effort is normal. No respiratory distress.     Breath sounds: Normal breath sounds. No wheezing, rhonchi or rales.  Musculoskeletal:     Right lower leg: Edema present.  Left lower leg: Edema present.     Comments: Wearing TED hose  Skin:    General: Skin is warm and dry.     Findings: No rash.  Neurological:     Mental Status: She is alert.  Psychiatric:        Mood and Affect: Mood normal.        Behavior: Behavior normal.    Results for orders placed or performed in visit on 16/38/45  Basic metabolic panel  Result Value Ref Range   Sodium 137 135 - 145 mEq/L   Potassium 3.5 3.5 - 5.1 mEq/L   Chloride 102 96 - 112 mEq/L   CO2 26 19 - 32 mEq/L   Glucose, Bld 137 (H) 70 - 99 mg/dL   BUN 16 6 - 23 mg/dL   Creatinine, Ser 0.70 0.40 - 1.20 mg/dL   GFR 82.19 >60.00 mL/min   Calcium 9.4 8.4 - 10.5 mg/dL  VITAMIN D 25 Hydroxy (Vit-D Deficiency, Fractures)  Result Value Ref Range   VITD 23.69 (L) 30.00 - 100.00 ng/mL  Hepatic function panel  Result Value Ref Range   Total Bilirubin 1.1 0.2 - 1.2 mg/dL   Bilirubin, Direct 0.2 0.0 - 0.3 mg/dL   Alkaline Phosphatase 82 39 - 117 U/L   AST 13 0 - 37 U/L   ALT 3 0 - 35 U/L   Total Protein 7.2 6.0 - 8.3 g/dL   Albumin 3.9 3.5 - 5.2 g/dL  CBC with Differential/Platelet  Result Value Ref Range   WBC 5.0 4.0 - 10.5 K/uL   RBC 3.67 (L) 3.87 - 5.11 Mil/uL   Hemoglobin 11.4 (L) 12.0 - 15.0 g/dL   HCT 35.1 (L) 36.0 - 46.0 %   MCV 95.7 78.0 - 100.0 fl   MCHC 32.6 30.0 - 36.0 g/dL   RDW 15.6 (H) 11.5 - 15.5 %   Platelets 214.0 150.0 - 400.0 K/uL   Neutrophils Relative % 66.5 43.0 - 77.0 %   Lymphocytes Relative 19.2 12.0 - 46.0 %   Monocytes Relative 9.1 3.0 - 12.0 %   Eosinophils Relative 4.0 0.0 - 5.0 %    Basophils Relative 1.2 0.0 - 3.0 %   Neutro Abs 3.3 1.4 - 7.7 K/uL   Lymphs Abs 1.0 0.7 - 4.0 K/uL   Monocytes Absolute 0.5 0.1 - 1.0 K/uL   Eosinophils Absolute 0.2 0.0 - 0.7 K/uL   Basophils Absolute 0.1 0.0 - 0.1 K/uL  Vitamin B12  Result Value Ref Range   Vitamin B-12 350 211 - 911 pg/mL  POCT Urinalysis Dipstick (Automated)  Result Value Ref Range   Color, UA yellow    Clarity, UA clear    Glucose, UA Negative Negative   Bilirubin, UA negative    Ketones, UA negative    Spec Grav, UA 1.020 1.010 - 1.025   Blood, UA negative    pH, UA 5.5 5.0 - 8.0   Protein, UA Negative Negative   Urobilinogen, UA 0.2 0.2 or 1.0 E.U./dL   Nitrite, UA negative    Leukocytes, UA Negative Negative   *Note: Due to a large number of results and/or encounters for the requested time period, some results have not been displayed. A complete set of results can be found in Results Review.      Lab Results  Component Value Date   HGBA1C 5.3 01/14/2018   DG Hip Unilat W or Wo Pelvis 2-3 Views Left CLINICAL DATA:  Recent left hip replacement.  Increasing pain.  EXAM: DG HIP (WITH OR WITHOUT PELVIS) 2-3V LEFT  COMPARISON:  08/21/2022.  FINDINGS: No fracture.  No bone lesion.  Left hip hemiarthroplasty appears well seated and aligned.  Right hip joint, SI joints and pubic symphysis are normally spaced and aligned.  Mild soft tissue edema overlies the proximal left femur/left hip.  IMPRESSION: 1. No fracture or acute finding. 2. Well-positioned left hip hemiarthroplasty. No evidence of prosthesis loosening.  Electronically Signed   By: Lajean Manes M.D.   On: 09/22/2022 11:12   Assessment & Plan:   Problem List Items Addressed This Visit     Chronic dyspnea    H/o this likely multifactorial due to known moderate to severe mitral regurgitation and chronic HFpEF s/p MitraClip x2, also with recent hip fracture repair surgery with postop anemia will recheck CBC.       Osteoporosis,  post-menopausal    Continue prolia      Vitamin D deficiency   Chronic fatigue    Possibly worse since hip replacement.  Update labs       Vitamin B12 deficiency   Severe mitral regurgitation   Closed left hip fracture (HCC) - Primary    Recent L hip fracture after fall s/p L hip hemiarthroplasty and subsequent SNF stay.  Came home 10/17, with ongoing activity limitations. HH has established with patient.  Seen at ER 09/22/2022 with leg pain, s/p reassuring evaluation.  She will continue working with [place].       Relevant Orders   CBC with Differential/Platelet (Completed)   UTI (urinary tract infection)    Recent UTI symptoms, was unable to provide sample for culture, treated with 5d keflex course, symptoms have fully resolved. Update UA today - normal.       Relevant Orders   POCT Urinalysis Dipstick (Automated) (Completed)     No orders of the defined types were placed in this encounter.  Orders Placed This Encounter  Procedures   CBC with Differential/Platelet   POCT Urinalysis Dipstick (Automated)     Patient Instructions  Labs today  Urinalysis today.  Continue current medicines.  Work on protein source with each meal.   Follow up plan: Return if symptoms worsen or fail to improve.  [name], MD

## 2022-09-25 NOTE — Patient Instructions (Addendum)
Labs today  Urinalysis today.  Continue current medicines.  Work on protein source with each meal.

## 2022-09-26 DIAGNOSIS — R2689 Other abnormalities of gait and mobility: Secondary | ICD-10-CM | POA: Diagnosis not present

## 2022-09-26 DIAGNOSIS — R278 Other lack of coordination: Secondary | ICD-10-CM | POA: Diagnosis not present

## 2022-09-26 DIAGNOSIS — I5033 Acute on chronic diastolic (congestive) heart failure: Secondary | ICD-10-CM | POA: Diagnosis not present

## 2022-09-26 DIAGNOSIS — J449 Chronic obstructive pulmonary disease, unspecified: Secondary | ICD-10-CM | POA: Diagnosis not present

## 2022-09-26 DIAGNOSIS — G20A1 Parkinson's disease without dyskinesia, without mention of fluctuations: Secondary | ICD-10-CM | POA: Diagnosis not present

## 2022-09-26 DIAGNOSIS — Z9181 History of falling: Secondary | ICD-10-CM | POA: Diagnosis not present

## 2022-09-26 DIAGNOSIS — Z96642 Presence of left artificial hip joint: Secondary | ICD-10-CM | POA: Diagnosis not present

## 2022-09-26 DIAGNOSIS — S72002D Fracture of unspecified part of neck of left femur, subsequent encounter for closed fracture with routine healing: Secondary | ICD-10-CM | POA: Diagnosis not present

## 2022-09-26 DIAGNOSIS — M6281 Muscle weakness (generalized): Secondary | ICD-10-CM | POA: Diagnosis not present

## 2022-09-27 ENCOUNTER — Other Ambulatory Visit: Payer: Self-pay | Admitting: Family Medicine

## 2022-09-27 MED ORDER — VITAMIN D3 25 MCG (1000 UT) PO CAPS: 1.0000 | ORAL_CAPSULE | Freq: Every day | ORAL | Status: DC

## 2022-09-27 NOTE — Assessment & Plan Note (Addendum)
Recent UTI symptoms, was unable to provide sample for culture, treated with 5d keflex course, symptoms have fully resolved. Update UA today - normal.

## 2022-09-27 NOTE — Assessment & Plan Note (Signed)
Continue prolia.  

## 2022-09-27 NOTE — Assessment & Plan Note (Addendum)
H/o this likely multifactorial due to known moderate to severe mitral regurgitation and chronic HFpEF s/p MitraClip x2, also with recent hip fracture repair surgery with postop anemia will recheck CBC.

## 2022-09-27 NOTE — Assessment & Plan Note (Signed)
Possibly worse since hip replacement.  Update labs

## 2022-09-27 NOTE — Assessment & Plan Note (Signed)
Recent L hip fracture after fall s/p L hip hemiarthroplasty and subsequent SNF stay.  Came home 10/17, with ongoing activity limitations. HH has established with patient.  Seen at ER 09/22/2022 with leg pain, s/p reassuring evaluation.  She will continue working with [place].

## 2022-09-28 ENCOUNTER — Ambulatory Visit: Payer: Medicare PPO | Admitting: Internal Medicine

## 2022-10-03 ENCOUNTER — Encounter: Payer: Self-pay | Admitting: Family Medicine

## 2022-10-03 DIAGNOSIS — G20A1 Parkinson's disease without dyskinesia, without mention of fluctuations: Secondary | ICD-10-CM | POA: Diagnosis not present

## 2022-10-03 DIAGNOSIS — R2689 Other abnormalities of gait and mobility: Secondary | ICD-10-CM | POA: Diagnosis not present

## 2022-10-03 DIAGNOSIS — R278 Other lack of coordination: Secondary | ICD-10-CM | POA: Diagnosis not present

## 2022-10-03 DIAGNOSIS — I5033 Acute on chronic diastolic (congestive) heart failure: Secondary | ICD-10-CM | POA: Diagnosis not present

## 2022-10-03 DIAGNOSIS — Z9181 History of falling: Secondary | ICD-10-CM | POA: Diagnosis not present

## 2022-10-03 DIAGNOSIS — J449 Chronic obstructive pulmonary disease, unspecified: Secondary | ICD-10-CM | POA: Diagnosis not present

## 2022-10-03 DIAGNOSIS — S72002D Fracture of unspecified part of neck of left femur, subsequent encounter for closed fracture with routine healing: Secondary | ICD-10-CM | POA: Diagnosis not present

## 2022-10-03 DIAGNOSIS — Z96642 Presence of left artificial hip joint: Secondary | ICD-10-CM | POA: Diagnosis not present

## 2022-10-03 DIAGNOSIS — M6281 Muscle weakness (generalized): Secondary | ICD-10-CM | POA: Diagnosis not present

## 2022-10-04 ENCOUNTER — Telehealth: Payer: Self-pay

## 2022-10-04 NOTE — Telephone Encounter (Signed)
Next injection due 09/30/22 or after. OOP cost $35 PA done 11/26/21-11/25/22 Patient asked to get this done later due to having recent hip replacement. Lab was done on 09/25/22 CrCl is 57.61 mL/min, Calcium 9.4 normal NV 10/30/22

## 2022-10-05 DIAGNOSIS — R2689 Other abnormalities of gait and mobility: Secondary | ICD-10-CM | POA: Diagnosis not present

## 2022-10-05 DIAGNOSIS — M6281 Muscle weakness (generalized): Secondary | ICD-10-CM | POA: Diagnosis not present

## 2022-10-05 DIAGNOSIS — S72002D Fracture of unspecified part of neck of left femur, subsequent encounter for closed fracture with routine healing: Secondary | ICD-10-CM | POA: Diagnosis not present

## 2022-10-05 DIAGNOSIS — G20A1 Parkinson's disease without dyskinesia, without mention of fluctuations: Secondary | ICD-10-CM | POA: Diagnosis not present

## 2022-10-05 DIAGNOSIS — J449 Chronic obstructive pulmonary disease, unspecified: Secondary | ICD-10-CM | POA: Diagnosis not present

## 2022-10-05 DIAGNOSIS — Z9181 History of falling: Secondary | ICD-10-CM | POA: Diagnosis not present

## 2022-10-05 DIAGNOSIS — R278 Other lack of coordination: Secondary | ICD-10-CM | POA: Diagnosis not present

## 2022-10-05 DIAGNOSIS — I5033 Acute on chronic diastolic (congestive) heart failure: Secondary | ICD-10-CM | POA: Diagnosis not present

## 2022-10-05 DIAGNOSIS — Z96642 Presence of left artificial hip joint: Secondary | ICD-10-CM | POA: Diagnosis not present

## 2022-10-08 DIAGNOSIS — J449 Chronic obstructive pulmonary disease, unspecified: Secondary | ICD-10-CM | POA: Diagnosis not present

## 2022-10-08 DIAGNOSIS — Z9181 History of falling: Secondary | ICD-10-CM | POA: Diagnosis not present

## 2022-10-08 DIAGNOSIS — Z96642 Presence of left artificial hip joint: Secondary | ICD-10-CM | POA: Diagnosis not present

## 2022-10-08 DIAGNOSIS — I5033 Acute on chronic diastolic (congestive) heart failure: Secondary | ICD-10-CM | POA: Diagnosis not present

## 2022-10-08 DIAGNOSIS — R2689 Other abnormalities of gait and mobility: Secondary | ICD-10-CM | POA: Diagnosis not present

## 2022-10-08 DIAGNOSIS — G20A1 Parkinson's disease without dyskinesia, without mention of fluctuations: Secondary | ICD-10-CM | POA: Diagnosis not present

## 2022-10-08 DIAGNOSIS — M6281 Muscle weakness (generalized): Secondary | ICD-10-CM | POA: Diagnosis not present

## 2022-10-08 DIAGNOSIS — R278 Other lack of coordination: Secondary | ICD-10-CM | POA: Diagnosis not present

## 2022-10-08 DIAGNOSIS — S72002D Fracture of unspecified part of neck of left femur, subsequent encounter for closed fracture with routine healing: Secondary | ICD-10-CM | POA: Diagnosis not present

## 2022-10-08 MED ORDER — FERROUS SULFATE 325 (65 FE) MG PO TABS: 325.0000 mg | ORAL_TABLET | Freq: Every day | ORAL | 1 refills | Status: DC

## 2022-10-08 NOTE — Telephone Encounter (Signed)
Patient uses [employer].

## 2022-10-09 ENCOUNTER — Ambulatory Visit: Payer: Medicare PPO | Admitting: Family Medicine

## 2022-10-10 DIAGNOSIS — R278 Other lack of coordination: Secondary | ICD-10-CM | POA: Diagnosis not present

## 2022-10-10 DIAGNOSIS — J449 Chronic obstructive pulmonary disease, unspecified: Secondary | ICD-10-CM | POA: Diagnosis not present

## 2022-10-10 DIAGNOSIS — S72002D Fracture of unspecified part of neck of left femur, subsequent encounter for closed fracture with routine healing: Secondary | ICD-10-CM | POA: Diagnosis not present

## 2022-10-10 DIAGNOSIS — Z96642 Presence of left artificial hip joint: Secondary | ICD-10-CM | POA: Diagnosis not present

## 2022-10-10 DIAGNOSIS — M6281 Muscle weakness (generalized): Secondary | ICD-10-CM | POA: Diagnosis not present

## 2022-10-10 DIAGNOSIS — G20A1 Parkinson's disease without dyskinesia, without mention of fluctuations: Secondary | ICD-10-CM | POA: Diagnosis not present

## 2022-10-10 DIAGNOSIS — I5033 Acute on chronic diastolic (congestive) heart failure: Secondary | ICD-10-CM | POA: Diagnosis not present

## 2022-10-10 DIAGNOSIS — R2689 Other abnormalities of gait and mobility: Secondary | ICD-10-CM | POA: Diagnosis not present

## 2022-10-10 DIAGNOSIS — Z9181 History of falling: Secondary | ICD-10-CM | POA: Diagnosis not present

## 2022-10-12 DIAGNOSIS — S72002D Fracture of unspecified part of neck of left femur, subsequent encounter for closed fracture with routine healing: Secondary | ICD-10-CM | POA: Diagnosis not present

## 2022-10-12 DIAGNOSIS — J449 Chronic obstructive pulmonary disease, unspecified: Secondary | ICD-10-CM | POA: Diagnosis not present

## 2022-10-12 DIAGNOSIS — Z96642 Presence of left artificial hip joint: Secondary | ICD-10-CM | POA: Diagnosis not present

## 2022-10-12 DIAGNOSIS — G20A1 Parkinson's disease without dyskinesia, without mention of fluctuations: Secondary | ICD-10-CM | POA: Diagnosis not present

## 2022-10-12 DIAGNOSIS — I5033 Acute on chronic diastolic (congestive) heart failure: Secondary | ICD-10-CM | POA: Diagnosis not present

## 2022-10-12 DIAGNOSIS — Z9181 History of falling: Secondary | ICD-10-CM | POA: Diagnosis not present

## 2022-10-12 DIAGNOSIS — M6281 Muscle weakness (generalized): Secondary | ICD-10-CM | POA: Diagnosis not present

## 2022-10-12 DIAGNOSIS — R2689 Other abnormalities of gait and mobility: Secondary | ICD-10-CM | POA: Diagnosis not present

## 2022-10-12 DIAGNOSIS — R278 Other lack of coordination: Secondary | ICD-10-CM | POA: Diagnosis not present

## 2022-10-15 ENCOUNTER — Ambulatory Visit: Payer: Medicare PPO | Admitting: Family

## 2022-10-15 DIAGNOSIS — J449 Chronic obstructive pulmonary disease, unspecified: Secondary | ICD-10-CM | POA: Diagnosis not present

## 2022-10-15 DIAGNOSIS — Z96642 Presence of left artificial hip joint: Secondary | ICD-10-CM | POA: Diagnosis not present

## 2022-10-15 DIAGNOSIS — M6281 Muscle weakness (generalized): Secondary | ICD-10-CM | POA: Diagnosis not present

## 2022-10-15 DIAGNOSIS — Z9181 History of falling: Secondary | ICD-10-CM | POA: Diagnosis not present

## 2022-10-15 DIAGNOSIS — I5033 Acute on chronic diastolic (congestive) heart failure: Secondary | ICD-10-CM | POA: Diagnosis not present

## 2022-10-15 DIAGNOSIS — R2689 Other abnormalities of gait and mobility: Secondary | ICD-10-CM | POA: Diagnosis not present

## 2022-10-15 DIAGNOSIS — G20A1 Parkinson's disease without dyskinesia, without mention of fluctuations: Secondary | ICD-10-CM | POA: Diagnosis not present

## 2022-10-15 DIAGNOSIS — S72002D Fracture of unspecified part of neck of left femur, subsequent encounter for closed fracture with routine healing: Secondary | ICD-10-CM | POA: Diagnosis not present

## 2022-10-15 DIAGNOSIS — R278 Other lack of coordination: Secondary | ICD-10-CM | POA: Diagnosis not present

## 2022-10-17 DIAGNOSIS — M6281 Muscle weakness (generalized): Secondary | ICD-10-CM | POA: Diagnosis not present

## 2022-10-17 DIAGNOSIS — I5033 Acute on chronic diastolic (congestive) heart failure: Secondary | ICD-10-CM | POA: Diagnosis not present

## 2022-10-17 DIAGNOSIS — R278 Other lack of coordination: Secondary | ICD-10-CM | POA: Diagnosis not present

## 2022-10-17 DIAGNOSIS — S72002D Fracture of unspecified part of neck of left femur, subsequent encounter for closed fracture with routine healing: Secondary | ICD-10-CM | POA: Diagnosis not present

## 2022-10-17 DIAGNOSIS — Z9181 History of falling: Secondary | ICD-10-CM | POA: Diagnosis not present

## 2022-10-17 DIAGNOSIS — Z96642 Presence of left artificial hip joint: Secondary | ICD-10-CM | POA: Diagnosis not present

## 2022-10-17 DIAGNOSIS — J449 Chronic obstructive pulmonary disease, unspecified: Secondary | ICD-10-CM | POA: Diagnosis not present

## 2022-10-17 DIAGNOSIS — R2689 Other abnormalities of gait and mobility: Secondary | ICD-10-CM | POA: Diagnosis not present

## 2022-10-17 DIAGNOSIS — G20A1 Parkinson's disease without dyskinesia, without mention of fluctuations: Secondary | ICD-10-CM | POA: Diagnosis not present

## 2022-10-22 DIAGNOSIS — I5033 Acute on chronic diastolic (congestive) heart failure: Secondary | ICD-10-CM | POA: Diagnosis not present

## 2022-10-22 DIAGNOSIS — S72002D Fracture of unspecified part of neck of left femur, subsequent encounter for closed fracture with routine healing: Secondary | ICD-10-CM | POA: Diagnosis not present

## 2022-10-22 DIAGNOSIS — R278 Other lack of coordination: Secondary | ICD-10-CM | POA: Diagnosis not present

## 2022-10-22 DIAGNOSIS — Z9181 History of falling: Secondary | ICD-10-CM | POA: Diagnosis not present

## 2022-10-22 DIAGNOSIS — R2689 Other abnormalities of gait and mobility: Secondary | ICD-10-CM | POA: Diagnosis not present

## 2022-10-22 DIAGNOSIS — J449 Chronic obstructive pulmonary disease, unspecified: Secondary | ICD-10-CM | POA: Diagnosis not present

## 2022-10-22 DIAGNOSIS — Z96642 Presence of left artificial hip joint: Secondary | ICD-10-CM | POA: Diagnosis not present

## 2022-10-22 DIAGNOSIS — G20A1 Parkinson's disease without dyskinesia, without mention of fluctuations: Secondary | ICD-10-CM | POA: Diagnosis not present

## 2022-10-22 DIAGNOSIS — M6281 Muscle weakness (generalized): Secondary | ICD-10-CM | POA: Diagnosis not present

## 2022-10-23 ENCOUNTER — Telehealth: Payer: Self-pay | Admitting: Family Medicine

## 2022-10-23 NOTE — Telephone Encounter (Signed)
Pt called in wants to know if it would be ok to get the RSV vaccine . Would like a call back stated can leave a message . Please advise # [phone]

## 2022-10-24 ENCOUNTER — Other Ambulatory Visit: Payer: Self-pay | Admitting: Cardiology

## 2022-10-24 DIAGNOSIS — J449 Chronic obstructive pulmonary disease, unspecified: Secondary | ICD-10-CM | POA: Diagnosis not present

## 2022-10-24 DIAGNOSIS — Z95818 Presence of other cardiac implants and grafts: Secondary | ICD-10-CM

## 2022-10-24 DIAGNOSIS — M6281 Muscle weakness (generalized): Secondary | ICD-10-CM | POA: Diagnosis not present

## 2022-10-24 DIAGNOSIS — R2689 Other abnormalities of gait and mobility: Secondary | ICD-10-CM | POA: Diagnosis not present

## 2022-10-24 DIAGNOSIS — Z96642 Presence of left artificial hip joint: Secondary | ICD-10-CM | POA: Diagnosis not present

## 2022-10-24 DIAGNOSIS — G20A1 Parkinson's disease without dyskinesia, without mention of fluctuations: Secondary | ICD-10-CM | POA: Diagnosis not present

## 2022-10-24 DIAGNOSIS — S72002D Fracture of unspecified part of neck of left femur, subsequent encounter for closed fracture with routine healing: Secondary | ICD-10-CM | POA: Diagnosis not present

## 2022-10-24 DIAGNOSIS — I340 Nonrheumatic mitral (valve) insufficiency: Secondary | ICD-10-CM

## 2022-10-24 DIAGNOSIS — R278 Other lack of coordination: Secondary | ICD-10-CM | POA: Diagnosis not present

## 2022-10-24 DIAGNOSIS — I5033 Acute on chronic diastolic (congestive) heart failure: Secondary | ICD-10-CM | POA: Diagnosis not present

## 2022-10-24 DIAGNOSIS — Z9181 History of falling: Secondary | ICD-10-CM | POA: Diagnosis not present

## 2022-10-24 NOTE — Progress Notes (Deleted)
Assessment/Plan:   1.  Parkinsons Disease  -Continue carbidopa/levodopa 25/100 CR, 2/1/1.  Changing from IR did not change hypersomnolence, but I did not change back because of orthostasis.  -Status post hip fracture on the left in September, 2023 after a fall. 2.  EDS             -don't think that related to PD             -this is the biggest c/o.     3.  Orthostatic hypotension, with history of associated syncope             -Refuses compression binder.             -Previously prescribed Northera but did not take.    -Patient is on blood pressure medications, but does not want to change that given her worry about her thoracic aortic aneurysm.  She does not want to stop her amlodopine.  States that when she did this in the past, her blood pressure raised up too high.  Discussed the concept of permissive hypertension with her with Parkinson's disease. 4.  REM behavior disorder              -Continue clonazepam 0.5 mg, half tablet at bedtime. 5.  Mild cognitive impairment             -Last neurocognitive testing was in September, 2018.  I have not seen significant change since that time.  She is noting some worsening of memory and discussed repeating but she wants to hold for now 6.  Severe mitral valve stenosis  -Status post mitral valve repair in March, 2023  -Post repair, patient with moderate mitral valve regurgitation.  They have offered repeat MitraClip, but patient deferred  -Patient continues with exertional dyspnea/shortness of breath.   Subjective:   Crystal Bass was seen today in follow up for Parkinsons disease.  My previous records were reviewed prior to todays visit as well as outside records available to me.  Unfortunately, the patient fell and fractured her hip and underwent left femoral neck fracture surgery on September 26.  She was discharged to subacute rehab.  She has completed that.  Current prescribed movement disorder medications: Carbidopa/levodopa  25/100 CR, 2 tablets in the morning, 1 in the afternoon and 1 in the evening Clonazepam 0.5 mg, half tablet at night   PREVIOUS MEDICATIONS: Sinemet (changed from IR due to EDS but not sure it really made a difference but we did not change back because of [place]); northera (RX by cardiology but not taken)  ALLERGIES:   Allergies  Allergen Reactions   Sulfa Antibiotics Anaphylaxis   Sulfonamide Derivatives Anaphylaxis   Topamax [Topiramate] Other (See Comments)    Metabolic acidosis    Clarithromycin Other (See Comments)    pericarditis Other reaction(s): Not available   Hydrocodone Other (See Comments)    "hyper and climbing the walls"   Motrin [Ibuprofen] Other (See Comments)    headaches   Misc. Sulfonamide Containing Compounds     Other reaction(s): Not available   Symbicort [Budesonide-Formoterol Fumarate] Other (See Comments)    02/07/15 tremor   Tetracycline Hcl Other (See Comments)   Percocet [Oxycodone-Acetaminophen] Itching and Rash   Tape Itching and Rash    Use paper tape only   Tetracyclines & Related Other (See Comments)    "immediate yeast infection"    CURRENT MEDICATIONS:  Outpatient Encounter Medications as of 10/25/2022  Medication Sig   acetaminophen (  TYLENOL) 500 MG tablet Take 1,000 mg by mouth every 12 (twelve) hours as needed.   albuterol (VENTOLIN HFA) 108 (90 Base) MCG/ACT inhaler Inhale 2 puffs into the lungs every 6 (six) hours as needed for wheezing or shortness of breath.   amLODipine (NORVASC) 2.5 MG tablet Take 1 tablet (2.5 mg total) by mouth daily.   aspirin 81 MG tablet Take 1 tablet (81 mg total) by mouth daily.   carbidopa-levodopa ([name]) 25-100 MG tablet Take 1 tablet by mouth 2 (two) times daily. See other 3 listings all reflected from [facility]   cephALEXin (KEFLEX) 500 MG capsule Take 1 capsule (500 mg total) by mouth 3 (three) times daily.   Cholecalciferol (VITAMIN D3) 25 MCG (1000 UT) CAPS Take 1  capsule (1,000 Units total) by mouth daily.   clonazePAM (KLONOPIN) 0.5 MG tablet Take 0.5 mg by mouth at bedtime.   enoxaparin (LOVENOX) 40 MG/0.4ML injection Inject 0.4 mLs (40 mg total) into the skin daily for 14 days.   EPINEPHrine 0.3 mg/0.3 mL IJ SOAJ injection Inject 0.3 mg into the muscle as needed for anaphylaxis.   ferrous sulfate 325 (65 FE) MG tablet Take 1 tablet (325 mg total) by mouth daily with breakfast.   fexofenadine (ALLEGRA) 180 MG tablet Take 180 mg by mouth daily.   Fluticasone-Umeclidin-Vilant (TRELEGY ELLIPTA) 100-62.5-25 MCG/ACT AEPB Inhale 1 puff into the lungs daily.   furosemide (LASIX) 20 MG tablet Take 1 tablet (20 mg total) by mouth daily.   gabapentin ([name]) 300 MG capsule TAKE 2 CAPSULES BY MOUTH 3 TIMES DAILY   pantoprazole (PROTONIX) 20 MG tablet Take 20 mg by mouth daily.   potassium chloride SA (KLOR-CON M) 20 MEQ tablet Take 1 tablet (20 mEq total) by mouth daily.   senna (SENOKOT) 8.6 MG TABS tablet Take 1 tablet (8.6 mg total) by mouth daily.   No facility-administered encounter medications on file as of 10/25/2022.    Objective:   PHYSICAL EXAMINATION:    VITALS:   There were no vitals filed for this visit.     GEN:  The patient appears stated age and is in NAD. HEENT:  Normocephalic, atraumatic.  The mucous membranes are moist. The superficial temporal arteries are without ropiness or tenderness. CV:  RRR Lungs:  CTAB.  There is some DOE Neck/HEME:  There are no carotid bruits bilaterally.  Neurological examination:  Orientation: The patient is alert and oriented x3. Cranial nerves: There is good facial symmetry with facial hypomimia. The speech is fluent and clear. Soft palate rises symmetrically and there is no tongue deviation. Hearing is intact to conversational tone. Sensation: Sensation is intact to light touch throughout Motor: Strength is at least antigravity x4.  Movement examination: Tone: There is normal tone in the  UE/LE Abnormal movements: Rare tremor of the left hand. Coordination:  There is mild decremation with finger taps on the right and heel/toe taps bilaterally Gait and Station: The patient has no difficulty arising out of a deep-seated chair without the use of the hands. The patient's stride length is good   I have reviewed and interpreted the following labs independently    Chemistry      Component Value Date/Time   NA 137 09/25/2022 1340   NA 140 04/30/2022 1249   NA 141 03/28/2017 1130   K 3.5 09/25/2022 1340   K 4.2 03/28/2017 1130   CL 102 09/25/2022 1340   CL 102 03/10/2013 1529   CO2  26 09/25/2022 1340   CO2 28 03/28/2017 1130   BUN 16 09/25/2022 1340   BUN 12 04/30/2022 1249   BUN 17.3 03/28/2017 1130   CREATININE 0.70 09/25/2022 1340   CREATININE 0.8 03/28/2017 1130      Component Value Date/Time   CALCIUM 9.4 09/25/2022 1340   CALCIUM 9.6 03/28/2017 1130   ALKPHOS 82 09/25/2022 1340   ALKPHOS 85 03/28/2017 1130   AST 13 09/25/2022 1340   AST 12 03/28/2017 1130   ALT 3 09/25/2022 1340   ALT <6 03/28/2017 1130   BILITOT 1.1 09/25/2022 1340   BILITOT 1.38 (H) 03/28/2017 1130       Lab Results  Component Value Date   WBC 5.0 09/25/2022   HGB 11.4 (L) 09/25/2022   HCT 35.1 (L) 09/25/2022   MCV 95.7 09/25/2022   PLT 214.0 09/25/2022    Lab Results  Component Value Date   TSH 0.84 03/23/2022     Total time spent on today's visit was *** minutes, including both face-to-face time and nonface-to-face time.  Time included that spent on review of records (prior notes available to me/labs/imaging if pertinent), discussing treatment and goals, answering patient's questions and coordinating care.  Cc:  [name], MD

## 2022-10-24 NOTE — Telephone Encounter (Signed)
Spoke pt relaying Dr. Synthia Innocent message.  Pt verbalizes understanding and will have the pharmacy send [place] notification when she gets it.

## 2022-10-24 NOTE — Telephone Encounter (Signed)
Yes I do recommend she get RSV vaccine.

## 2022-10-25 ENCOUNTER — Ambulatory Visit: Payer: Medicare PPO | Admitting: Neurology

## 2022-10-29 DIAGNOSIS — S72002D Fracture of unspecified part of neck of left femur, subsequent encounter for closed fracture with routine healing: Secondary | ICD-10-CM | POA: Diagnosis not present

## 2022-10-29 DIAGNOSIS — I5033 Acute on chronic diastolic (congestive) heart failure: Secondary | ICD-10-CM | POA: Diagnosis not present

## 2022-10-29 DIAGNOSIS — M6281 Muscle weakness (generalized): Secondary | ICD-10-CM | POA: Diagnosis not present

## 2022-10-29 DIAGNOSIS — R2689 Other abnormalities of gait and mobility: Secondary | ICD-10-CM | POA: Diagnosis not present

## 2022-10-29 DIAGNOSIS — Z9181 History of falling: Secondary | ICD-10-CM | POA: Diagnosis not present

## 2022-10-29 DIAGNOSIS — Z96642 Presence of left artificial hip joint: Secondary | ICD-10-CM | POA: Diagnosis not present

## 2022-10-29 DIAGNOSIS — G20A1 Parkinson's disease without dyskinesia, without mention of fluctuations: Secondary | ICD-10-CM | POA: Diagnosis not present

## 2022-10-29 DIAGNOSIS — J449 Chronic obstructive pulmonary disease, unspecified: Secondary | ICD-10-CM | POA: Diagnosis not present

## 2022-10-29 DIAGNOSIS — R278 Other lack of coordination: Secondary | ICD-10-CM | POA: Diagnosis not present

## 2022-10-30 ENCOUNTER — Ambulatory Visit (INDEPENDENT_AMBULATORY_CARE_PROVIDER_SITE_OTHER): Payer: Medicare PPO

## 2022-10-30 DIAGNOSIS — M810 Age-related osteoporosis without current pathological fracture: Secondary | ICD-10-CM | POA: Diagnosis not present

## 2022-10-30 MED ORDER — DENOSUMAB 60 MG/ML ~~LOC~~ SOSY
60.0000 mg | PREFILLED_SYRINGE | Freq: Once | SUBCUTANEOUS | Status: AC
  Administered 2022-10-30: 60 mg via SUBCUTANEOUS

## 2022-10-30 NOTE — Progress Notes (Signed)
Per conversation with Dr [name]: Her calcium was checked 09-25-22. Approval given to do the shot since it is over 30 days from her calcium check. Encouraged her to increase calcium in diet especially around Prolia shot. If unable that, make sure to take a calcium supplement daily around prolia injection.   Per orders of Dr. [name], injection of Prolia given by Pilar Grammes, CMA in Right Arm Patient tolerated injection well.

## 2022-10-31 ENCOUNTER — Telehealth: Payer: Self-pay

## 2022-10-31 DIAGNOSIS — R2689 Other abnormalities of gait and mobility: Secondary | ICD-10-CM | POA: Diagnosis not present

## 2022-10-31 DIAGNOSIS — M6281 Muscle weakness (generalized): Secondary | ICD-10-CM | POA: Diagnosis not present

## 2022-10-31 DIAGNOSIS — R278 Other lack of coordination: Secondary | ICD-10-CM | POA: Diagnosis not present

## 2022-10-31 DIAGNOSIS — Z9181 History of falling: Secondary | ICD-10-CM | POA: Diagnosis not present

## 2022-10-31 DIAGNOSIS — G20A1 Parkinson's disease without dyskinesia, without mention of fluctuations: Secondary | ICD-10-CM | POA: Diagnosis not present

## 2022-10-31 DIAGNOSIS — J449 Chronic obstructive pulmonary disease, unspecified: Secondary | ICD-10-CM | POA: Diagnosis not present

## 2022-10-31 DIAGNOSIS — Z96642 Presence of left artificial hip joint: Secondary | ICD-10-CM | POA: Diagnosis not present

## 2022-10-31 DIAGNOSIS — Z4889 Encounter for other specified surgical aftercare: Secondary | ICD-10-CM | POA: Diagnosis not present

## 2022-10-31 DIAGNOSIS — I5033 Acute on chronic diastolic (congestive) heart failure: Secondary | ICD-10-CM | POA: Diagnosis not present

## 2022-10-31 DIAGNOSIS — S72002D Fracture of unspecified part of neck of left femur, subsequent encounter for closed fracture with routine healing: Secondary | ICD-10-CM | POA: Diagnosis not present

## 2022-10-31 NOTE — Telephone Encounter (Signed)
Last Prolia inj: 10/30/22 Next Prolia inj DUE: 04/29/23

## 2022-11-02 DIAGNOSIS — G20A1 Parkinson's disease without dyskinesia, without mention of fluctuations: Secondary | ICD-10-CM | POA: Diagnosis not present

## 2022-11-02 DIAGNOSIS — S72002D Fracture of unspecified part of neck of left femur, subsequent encounter for closed fracture with routine healing: Secondary | ICD-10-CM | POA: Diagnosis not present

## 2022-11-02 DIAGNOSIS — Z9181 History of falling: Secondary | ICD-10-CM | POA: Diagnosis not present

## 2022-11-02 DIAGNOSIS — I5033 Acute on chronic diastolic (congestive) heart failure: Secondary | ICD-10-CM | POA: Diagnosis not present

## 2022-11-02 DIAGNOSIS — R278 Other lack of coordination: Secondary | ICD-10-CM | POA: Diagnosis not present

## 2022-11-02 DIAGNOSIS — R2689 Other abnormalities of gait and mobility: Secondary | ICD-10-CM | POA: Diagnosis not present

## 2022-11-02 DIAGNOSIS — M6281 Muscle weakness (generalized): Secondary | ICD-10-CM | POA: Diagnosis not present

## 2022-11-02 DIAGNOSIS — J449 Chronic obstructive pulmonary disease, unspecified: Secondary | ICD-10-CM | POA: Diagnosis not present

## 2022-11-02 DIAGNOSIS — Z96642 Presence of left artificial hip joint: Secondary | ICD-10-CM | POA: Diagnosis not present

## 2022-11-05 DIAGNOSIS — R2689 Other abnormalities of gait and mobility: Secondary | ICD-10-CM | POA: Diagnosis not present

## 2022-11-05 DIAGNOSIS — Z96642 Presence of left artificial hip joint: Secondary | ICD-10-CM | POA: Diagnosis not present

## 2022-11-05 DIAGNOSIS — Z9181 History of falling: Secondary | ICD-10-CM | POA: Diagnosis not present

## 2022-11-05 DIAGNOSIS — J449 Chronic obstructive pulmonary disease, unspecified: Secondary | ICD-10-CM | POA: Diagnosis not present

## 2022-11-05 DIAGNOSIS — S72002D Fracture of unspecified part of neck of left femur, subsequent encounter for closed fracture with routine healing: Secondary | ICD-10-CM | POA: Diagnosis not present

## 2022-11-05 DIAGNOSIS — I5033 Acute on chronic diastolic (congestive) heart failure: Secondary | ICD-10-CM | POA: Diagnosis not present

## 2022-11-05 DIAGNOSIS — R278 Other lack of coordination: Secondary | ICD-10-CM | POA: Diagnosis not present

## 2022-11-05 DIAGNOSIS — M6281 Muscle weakness (generalized): Secondary | ICD-10-CM | POA: Diagnosis not present

## 2022-11-05 DIAGNOSIS — G20A1 Parkinson's disease without dyskinesia, without mention of fluctuations: Secondary | ICD-10-CM | POA: Diagnosis not present

## 2022-11-07 DIAGNOSIS — R278 Other lack of coordination: Secondary | ICD-10-CM | POA: Diagnosis not present

## 2022-11-07 DIAGNOSIS — Z9181 History of falling: Secondary | ICD-10-CM | POA: Diagnosis not present

## 2022-11-07 DIAGNOSIS — I5033 Acute on chronic diastolic (congestive) heart failure: Secondary | ICD-10-CM | POA: Diagnosis not present

## 2022-11-07 DIAGNOSIS — S72002D Fracture of unspecified part of neck of left femur, subsequent encounter for closed fracture with routine healing: Secondary | ICD-10-CM | POA: Diagnosis not present

## 2022-11-07 DIAGNOSIS — J449 Chronic obstructive pulmonary disease, unspecified: Secondary | ICD-10-CM | POA: Diagnosis not present

## 2022-11-07 DIAGNOSIS — R2689 Other abnormalities of gait and mobility: Secondary | ICD-10-CM | POA: Diagnosis not present

## 2022-11-07 DIAGNOSIS — Z96642 Presence of left artificial hip joint: Secondary | ICD-10-CM | POA: Diagnosis not present

## 2022-11-07 DIAGNOSIS — G20A1 Parkinson's disease without dyskinesia, without mention of fluctuations: Secondary | ICD-10-CM | POA: Diagnosis not present

## 2022-11-07 DIAGNOSIS — M6281 Muscle weakness (generalized): Secondary | ICD-10-CM | POA: Diagnosis not present

## 2022-11-09 DIAGNOSIS — S72002D Fracture of unspecified part of neck of left femur, subsequent encounter for closed fracture with routine healing: Secondary | ICD-10-CM | POA: Diagnosis not present

## 2022-11-09 DIAGNOSIS — G20A1 Parkinson's disease without dyskinesia, without mention of fluctuations: Secondary | ICD-10-CM | POA: Diagnosis not present

## 2022-11-09 DIAGNOSIS — Z9181 History of falling: Secondary | ICD-10-CM | POA: Diagnosis not present

## 2022-11-09 DIAGNOSIS — R278 Other lack of coordination: Secondary | ICD-10-CM | POA: Diagnosis not present

## 2022-11-09 DIAGNOSIS — Z96642 Presence of left artificial hip joint: Secondary | ICD-10-CM | POA: Diagnosis not present

## 2022-11-09 DIAGNOSIS — I5033 Acute on chronic diastolic (congestive) heart failure: Secondary | ICD-10-CM | POA: Diagnosis not present

## 2022-11-09 DIAGNOSIS — M6281 Muscle weakness (generalized): Secondary | ICD-10-CM | POA: Diagnosis not present

## 2022-11-09 DIAGNOSIS — J449 Chronic obstructive pulmonary disease, unspecified: Secondary | ICD-10-CM | POA: Diagnosis not present

## 2022-11-09 DIAGNOSIS — R2689 Other abnormalities of gait and mobility: Secondary | ICD-10-CM | POA: Diagnosis not present

## 2022-11-12 DIAGNOSIS — J449 Chronic obstructive pulmonary disease, unspecified: Secondary | ICD-10-CM | POA: Diagnosis not present

## 2022-11-12 DIAGNOSIS — S72002D Fracture of unspecified part of neck of left femur, subsequent encounter for closed fracture with routine healing: Secondary | ICD-10-CM | POA: Diagnosis not present

## 2022-11-12 DIAGNOSIS — R2689 Other abnormalities of gait and mobility: Secondary | ICD-10-CM | POA: Diagnosis not present

## 2022-11-12 DIAGNOSIS — Z96642 Presence of left artificial hip joint: Secondary | ICD-10-CM | POA: Diagnosis not present

## 2022-11-12 DIAGNOSIS — G20A1 Parkinson's disease without dyskinesia, without mention of fluctuations: Secondary | ICD-10-CM | POA: Diagnosis not present

## 2022-11-12 DIAGNOSIS — Z9181 History of falling: Secondary | ICD-10-CM | POA: Diagnosis not present

## 2022-11-12 DIAGNOSIS — R278 Other lack of coordination: Secondary | ICD-10-CM | POA: Diagnosis not present

## 2022-11-12 DIAGNOSIS — M6281 Muscle weakness (generalized): Secondary | ICD-10-CM | POA: Diagnosis not present

## 2022-11-12 DIAGNOSIS — I5033 Acute on chronic diastolic (congestive) heart failure: Secondary | ICD-10-CM | POA: Diagnosis not present

## 2022-11-14 DIAGNOSIS — R278 Other lack of coordination: Secondary | ICD-10-CM | POA: Diagnosis not present

## 2022-11-14 DIAGNOSIS — I5033 Acute on chronic diastolic (congestive) heart failure: Secondary | ICD-10-CM | POA: Diagnosis not present

## 2022-11-14 DIAGNOSIS — M6281 Muscle weakness (generalized): Secondary | ICD-10-CM | POA: Diagnosis not present

## 2022-11-14 DIAGNOSIS — Z9181 History of falling: Secondary | ICD-10-CM | POA: Diagnosis not present

## 2022-11-14 DIAGNOSIS — G20A1 Parkinson's disease without dyskinesia, without mention of fluctuations: Secondary | ICD-10-CM | POA: Diagnosis not present

## 2022-11-14 DIAGNOSIS — Z96642 Presence of left artificial hip joint: Secondary | ICD-10-CM | POA: Diagnosis not present

## 2022-11-14 DIAGNOSIS — J449 Chronic obstructive pulmonary disease, unspecified: Secondary | ICD-10-CM | POA: Diagnosis not present

## 2022-11-14 DIAGNOSIS — S72002D Fracture of unspecified part of neck of left femur, subsequent encounter for closed fracture with routine healing: Secondary | ICD-10-CM | POA: Diagnosis not present

## 2022-11-14 DIAGNOSIS — R2689 Other abnormalities of gait and mobility: Secondary | ICD-10-CM | POA: Diagnosis not present

## 2022-11-16 DIAGNOSIS — R278 Other lack of coordination: Secondary | ICD-10-CM | POA: Diagnosis not present

## 2022-11-16 DIAGNOSIS — G20A1 Parkinson's disease without dyskinesia, without mention of fluctuations: Secondary | ICD-10-CM | POA: Diagnosis not present

## 2022-11-16 DIAGNOSIS — J449 Chronic obstructive pulmonary disease, unspecified: Secondary | ICD-10-CM | POA: Diagnosis not present

## 2022-11-16 DIAGNOSIS — S72002D Fracture of unspecified part of neck of left femur, subsequent encounter for closed fracture with routine healing: Secondary | ICD-10-CM | POA: Diagnosis not present

## 2022-11-16 DIAGNOSIS — Z96642 Presence of left artificial hip joint: Secondary | ICD-10-CM | POA: Diagnosis not present

## 2022-11-16 DIAGNOSIS — M6281 Muscle weakness (generalized): Secondary | ICD-10-CM | POA: Diagnosis not present

## 2022-11-16 DIAGNOSIS — Z9181 History of falling: Secondary | ICD-10-CM | POA: Diagnosis not present

## 2022-11-16 DIAGNOSIS — R2689 Other abnormalities of gait and mobility: Secondary | ICD-10-CM | POA: Diagnosis not present

## 2022-11-16 DIAGNOSIS — I5033 Acute on chronic diastolic (congestive) heart failure: Secondary | ICD-10-CM | POA: Diagnosis not present

## 2022-11-20 ENCOUNTER — Telehealth: Payer: Self-pay | Admitting: Family Medicine

## 2022-11-20 NOTE — Telephone Encounter (Signed)
Crystal Bass - Client TELEPHONE ADVICE RECORD [facility] Patient Name: Crystal Bass [place] Gender: Female DOB: [birth year] Age: 81 Y 30 M 8 D Return Phone Number: [phone] (Primary) Address: City/ State/ Zip: [place]  [address] Client Crystal Bass - Client Client Site [facility] Provider [name] - MD Contact Type Call Who Is Calling Patient / Member / Family / Caregiver Call Type Triage / Clinical Relationship To Patient Self Return Phone Number [phone] (Primary) Chief Complaint BREATHING - shortness of breath or sounds breathless Reason for Call Symptomatic / Request for [place] states she is having SOB and coughing up green mucus. Additional Comment No Appointment for today per office staff Translation No Nurse Assessment Nurse: Nicole Cella, RN, Cortney Date/Time (Eastern Time): 11/20/2022 11:20:51 AM Confirm and document reason for call. If symptomatic, describe symptoms. ---Caller states she is having SOB and coughing up green mucus. States also retaining a lot of fluid, on small dose of lasix. States laying down on night worsens breathing. O2 between 80%-84% Today is 89% Does the patient have any new or worsening symptoms? ---Yes Will a triage be completed? ---Yes Related visit to physician within the last 2 weeks? ---No Does the PT have any chronic conditions? (i.e. diabetes, asthma, this includes High risk factors for pregnancy, etc.) ---Yes List chronic conditions. ---Asthma, copd, Is this a behavioral health or substance abuse call? ---No Guidelines Guideline Title Affirmed Question Affirmed Notes Nurse Date/Time (Eastern Time) Cough - Acute Productive SEVERE difficulty breathing (e.g., struggling for each breath, speaks in single words) [place], RN, Cortney 11/20/2022 11:23:13 AM PLEASE NOTE: All timestamps  contained within this report are represented as [place] Standard Time. CONFIDENTIALTY NOTICE: This fax transmission is intended only for the addressee. It contains information that is legally privileged, confidential or otherwise protected from use or disclosure. If you are not the intended recipient, you are strictly prohibited from reviewing, disclosing, copying using or disseminating any of this information or taking any action in reliance on or regarding this information. If you have received this fax in error, please notify us immediately by telephone so that we can arrange for its return to [place]. Phone: [phone], Toll-Free: [phone], Fax: [phone] Page: 2 of 2 Call Id: [id number] [place]. Time [name] Time) Disposition Final User 11/20/2022 11:18:44 AM Send to Urgent Elease [name] 11/20/2022 11:25:15 AM Call EMS 911 Now Yes [place], RN, Cortney 11/20/2022 11:27:25 AM 911 Outcome Documentation [place], RN, Cortney Reason: refusing to call 911 because she states they have been there twice this AM and tell her that she needs to see her pcp if she does not want to be have a long wait in ED. Call states trying to call another MD office to get an appt, advised that if she does not get an appt today to call 911 immediately or for any worsening sx call 911 . Final Disposition 11/20/2022 11:25:15 AM Call EMS 911 Now Yes [place], RN, Cortney Caller Disagree/Comply Disagree Caller Understands Yes PreDisposition 911 Care Advice Given Per Guideline CALL EMS 911 NOW: * Immediate medical attention is needed. You need to hang up and call 911 (or an ambulance). * Triager Discretion: I'll call you back in a few minutes to be sure you were able to reach them. CARE ADVICE given per Cough - Acute Productive (Adult) guideline. Comments User: [name], RN Date/Time [name] Time): 11/20/2022 11:25:02 AM States seeing EMT's already this AM around  5AM and then again an hour ago

## 2022-11-20 NOTE — Telephone Encounter (Signed)
I spoke with pt;and pt is very short of breath while talking on phone; pt said EMS from [place] have come out twice today and no available appts at [facility] and oxygen levels on room air are going between 80-89%. Pt said she is concerned about germs and not so much the wait time at ED. Advised pt she needs to go to ED for eval and testing and pt said she will consider it. Pt would not guarantee that she is going to ED. Sending note To Dr Cristopher Peru pool and will teams [facility] CMA.

## 2022-11-20 NOTE — Telephone Encounter (Signed)
Patient calling experiencing sob, oxygen in the 80s  Coughing up green phlegm  Transferred to access nurse  At [facility]

## 2022-11-20 NOTE — Telephone Encounter (Signed)
Agree with ER evaluation given low oxygen levels which is a new development. Given low oxygen levels, don't recommend office or UCC eval but rather ER.

## 2022-11-20 NOTE — Telephone Encounter (Signed)
Lvm asking pt to call back.  Need to relay Dr. G's message.  

## 2022-11-21 ENCOUNTER — Inpatient Hospital Stay
Admission: EM | Admit: 2022-11-21 | Discharge: 2022-11-26 | DRG: 291 | Disposition: A | Discharge status: Home / Self Care | Payer: Medicare PPO | Attending: Internal Medicine | Admitting: Internal Medicine

## 2022-11-21 ENCOUNTER — Emergency Department: Payer: Medicare PPO

## 2022-11-21 ENCOUNTER — Other Ambulatory Visit: Payer: Self-pay

## 2022-11-21 DIAGNOSIS — Z9079 Acquired absence of other genital organ(s): Secondary | ICD-10-CM

## 2022-11-21 DIAGNOSIS — Z882 Allergy status to sulfonamides status: Secondary | ICD-10-CM

## 2022-11-21 DIAGNOSIS — Z96642 Presence of left artificial hip joint: Secondary | ICD-10-CM | POA: Diagnosis present

## 2022-11-21 DIAGNOSIS — Z86718 Personal history of other venous thrombosis and embolism: Secondary | ICD-10-CM

## 2022-11-21 DIAGNOSIS — R0902 Hypoxemia: Secondary | ICD-10-CM | POA: Diagnosis not present

## 2022-11-21 DIAGNOSIS — J4489 Other specified chronic obstructive pulmonary disease: Secondary | ICD-10-CM | POA: Diagnosis present

## 2022-11-21 DIAGNOSIS — Z7982 Long term (current) use of aspirin: Secondary | ICD-10-CM

## 2022-11-21 DIAGNOSIS — Z853 Personal history of malignant neoplasm of breast: Secondary | ICD-10-CM | POA: Diagnosis not present

## 2022-11-21 DIAGNOSIS — R918 Other nonspecific abnormal finding of lung field: Secondary | ICD-10-CM | POA: Diagnosis present

## 2022-11-21 DIAGNOSIS — J90 Pleural effusion, not elsewhere classified: Secondary | ICD-10-CM | POA: Diagnosis not present

## 2022-11-21 DIAGNOSIS — Z1152 Encounter for screening for COVID-19: Secondary | ICD-10-CM

## 2022-11-21 DIAGNOSIS — M810 Age-related osteoporosis without current pathological fracture: Secondary | ICD-10-CM | POA: Diagnosis present

## 2022-11-21 DIAGNOSIS — Z9889 Other specified postprocedural states: Secondary | ICD-10-CM

## 2022-11-21 DIAGNOSIS — F419 Anxiety disorder, unspecified: Secondary | ICD-10-CM | POA: Diagnosis present

## 2022-11-21 DIAGNOSIS — I5033 Acute on chronic diastolic (congestive) heart failure: Secondary | ICD-10-CM | POA: Diagnosis present

## 2022-11-21 DIAGNOSIS — E8770 Fluid overload, unspecified: Principal | ICD-10-CM

## 2022-11-21 DIAGNOSIS — D696 Thrombocytopenia, unspecified: Secondary | ICD-10-CM | POA: Diagnosis not present

## 2022-11-21 DIAGNOSIS — R0600 Dyspnea, unspecified: Secondary | ICD-10-CM | POA: Diagnosis not present

## 2022-11-21 DIAGNOSIS — Z9049 Acquired absence of other specified parts of digestive tract: Secondary | ICD-10-CM | POA: Diagnosis not present

## 2022-11-21 DIAGNOSIS — Z90710 Acquired absence of both cervix and uterus: Secondary | ICD-10-CM | POA: Diagnosis not present

## 2022-11-21 DIAGNOSIS — J9811 Atelectasis: Secondary | ICD-10-CM | POA: Diagnosis not present

## 2022-11-21 DIAGNOSIS — I509 Heart failure, unspecified: Secondary | ICD-10-CM

## 2022-11-21 DIAGNOSIS — Z820 Family history of epilepsy and other diseases of the nervous system: Secondary | ICD-10-CM | POA: Diagnosis not present

## 2022-11-21 DIAGNOSIS — Z825 Family history of asthma and other chronic lower respiratory diseases: Secondary | ICD-10-CM

## 2022-11-21 DIAGNOSIS — R0602 Shortness of breath: Secondary | ICD-10-CM | POA: Diagnosis not present

## 2022-11-21 DIAGNOSIS — Z886 Allergy status to analgesic agent status: Secondary | ICD-10-CM

## 2022-11-21 DIAGNOSIS — E876 Hypokalemia: Secondary | ICD-10-CM | POA: Diagnosis present

## 2022-11-21 DIAGNOSIS — R059 Cough, unspecified: Secondary | ICD-10-CM | POA: Diagnosis not present

## 2022-11-21 DIAGNOSIS — J9601 Acute respiratory failure with hypoxia: Secondary | ICD-10-CM | POA: Diagnosis not present

## 2022-11-21 DIAGNOSIS — K219 Gastro-esophageal reflux disease without esophagitis: Secondary | ICD-10-CM | POA: Diagnosis present

## 2022-11-21 DIAGNOSIS — I110 Hypertensive heart disease with heart failure: Principal | ICD-10-CM | POA: Diagnosis present

## 2022-11-21 DIAGNOSIS — G20A1 Parkinson's disease without dyskinesia, without mention of fluctuations: Secondary | ICD-10-CM | POA: Diagnosis not present

## 2022-11-21 DIAGNOSIS — R0689 Other abnormalities of breathing: Secondary | ICD-10-CM | POA: Diagnosis not present

## 2022-11-21 DIAGNOSIS — Z79899 Other long term (current) drug therapy: Secondary | ICD-10-CM

## 2022-11-21 DIAGNOSIS — Z888 Allergy status to other drugs, medicaments and biological substances status: Secondary | ICD-10-CM

## 2022-11-21 DIAGNOSIS — Z9012 Acquired absence of left breast and nipple: Secondary | ICD-10-CM

## 2022-11-21 DIAGNOSIS — Z90722 Acquired absence of ovaries, bilateral: Secondary | ICD-10-CM

## 2022-11-21 DIAGNOSIS — R54 Age-related physical debility: Secondary | ICD-10-CM | POA: Diagnosis not present

## 2022-11-21 DIAGNOSIS — Z7951 Long term (current) use of inhaled steroids: Secondary | ICD-10-CM

## 2022-11-21 DIAGNOSIS — I340 Nonrheumatic mitral (valve) insufficiency: Secondary | ICD-10-CM | POA: Diagnosis present

## 2022-11-21 DIAGNOSIS — M1990 Unspecified osteoarthritis, unspecified site: Secondary | ICD-10-CM | POA: Diagnosis present

## 2022-11-21 DIAGNOSIS — Z91048 Other nonmedicinal substance allergy status: Secondary | ICD-10-CM

## 2022-11-21 DIAGNOSIS — Z66 Do not resuscitate: Secondary | ICD-10-CM | POA: Diagnosis not present

## 2022-11-21 DIAGNOSIS — J449 Chronic obstructive pulmonary disease, unspecified: Secondary | ICD-10-CM | POA: Diagnosis present

## 2022-11-21 DIAGNOSIS — Z8262 Family history of osteoporosis: Secondary | ICD-10-CM

## 2022-11-21 DIAGNOSIS — A310 Pulmonary mycobacterial infection: Secondary | ICD-10-CM | POA: Diagnosis present

## 2022-11-21 DIAGNOSIS — Z803 Family history of malignant neoplasm of breast: Secondary | ICD-10-CM

## 2022-11-21 DIAGNOSIS — G4700 Insomnia, unspecified: Secondary | ICD-10-CM | POA: Diagnosis present

## 2022-11-21 DIAGNOSIS — I10 Essential (primary) hypertension: Secondary | ICD-10-CM | POA: Diagnosis present

## 2022-11-21 DIAGNOSIS — R069 Unspecified abnormalities of breathing: Secondary | ICD-10-CM | POA: Diagnosis not present

## 2022-11-21 DIAGNOSIS — Z881 Allergy status to other antibiotic agents status: Secondary | ICD-10-CM

## 2022-11-21 DIAGNOSIS — Z885 Allergy status to narcotic agent status: Secondary | ICD-10-CM

## 2022-11-21 LAB — BASIC METABOLIC PANEL
Anion gap: 8 (ref 5–15)
BUN: 14 mg/dL (ref 8–23)
CO2: 28 mmol/L (ref 22–32)
Calcium: 8.8 mg/dL — ABNORMAL LOW (ref 8.9–10.3)
Chloride: 103 mmol/L (ref 98–111)
Creatinine, Ser: 0.55 mg/dL (ref 0.44–1.00)
GFR, Estimated: >60 mL/min (ref >60)
Glucose, Bld: 107 mg/dL — ABNORMAL HIGH (ref 70–99)
Potassium: 3 mmol/L — ABNORMAL LOW (ref 3.5–5.1)
Sodium: 139 mmol/L (ref 135–145)

## 2022-11-21 LAB — RESP PANEL BY RT-PCR (FLU A&B, COVID) ARPGX2
Influenza A by PCR: NEGATIVE
Influenza B by PCR: NEGATIVE
SARS Coronavirus 2 by RT PCR: NEGATIVE

## 2022-11-21 LAB — CBC
HCT: 38.3 % (ref 36.0–46.0)
Hemoglobin: 11.9 g/dL — ABNORMAL LOW (ref 12.0–15.0)
MCH: 29.6 pg (ref 26.0–34.0)
MCHC: 31.1 g/dL (ref 30.0–36.0)
MCV: 95.3 fL (ref 80.0–100.0)
Platelets: 147 10*3/uL — ABNORMAL LOW (ref 150–400)
RBC: 4.02 MIL/uL (ref 3.87–5.11)
RDW: 14.6 % (ref 11.5–15.5)
WBC: 4.4 10*3/uL (ref 4.0–10.5)
nRBC: 0 % (ref 0.0–0.2)

## 2022-11-21 LAB — TROPONIN I (HIGH SENSITIVITY)
Troponin I (High Sensitivity): 16 ng/L (ref <18)
Troponin I (High Sensitivity): 15 ng/L (ref <18)

## 2022-11-21 LAB — PROCALCITONIN: Procalcitonin: <0.1 ng/mL

## 2022-11-21 LAB — BRAIN NATRIURETIC PEPTIDE: B Natriuretic Peptide: 719.9 pg/mL — ABNORMAL HIGH (ref 0.0–100.0)

## 2022-11-21 MED ORDER — FERROUS SULFATE 325 (65 FE) MG PO TABS
325.0000 mg | ORAL_TABLET | Freq: Every day | ORAL | Status: DC
  Administered 2022-11-22 – 2022-11-26 (×5): 325 mg via ORAL
  Filled 2022-11-21 (×5): qty 1

## 2022-11-21 MED ORDER — ACETAMINOPHEN 650 MG RE SUPP: 650.0000 mg | Freq: Four times a day (QID) | RECTAL | Status: AC | PRN

## 2022-11-21 MED ORDER — SENNA 8.6 MG PO TABS: 1.0000 | ORAL_TABLET | Freq: Two times a day (BID) | ORAL | Status: DC | PRN

## 2022-11-21 MED ORDER — FLUTICASONE FUROATE-VILANTEROL 100-25 MCG/ACT IN AEPB
1.0000 | INHALATION_SPRAY | Freq: Every day | RESPIRATORY_TRACT | Status: DC
  Administered 2022-11-24 – 2022-11-26 (×3): 1 via RESPIRATORY_TRACT
  Filled 2022-11-21 (×2): qty 28

## 2022-11-21 MED ORDER — ONDANSETRON HCL 4 MG PO TABS: 4.0000 mg | ORAL_TABLET | Freq: Four times a day (QID) | ORAL | Status: AC | PRN

## 2022-11-21 MED ORDER — PANTOPRAZOLE SODIUM 20 MG PO TBEC
20.0000 mg | DELAYED_RELEASE_TABLET | Freq: Every day | ORAL | Status: DC
  Administered 2022-11-22 – 2022-11-26 (×5): 20 mg via ORAL
  Filled 2022-11-21 (×5): qty 1

## 2022-11-21 MED ORDER — ONDANSETRON HCL 4 MG/2ML IJ SOLN: 4.0000 mg | Freq: Four times a day (QID) | INTRAMUSCULAR | Status: AC | PRN

## 2022-11-21 MED ORDER — SENNOSIDES-DOCUSATE SODIUM 8.6-50 MG PO TABS: 1.0000 | ORAL_TABLET | Freq: Every evening | ORAL | Status: DC | PRN

## 2022-11-21 MED ORDER — HYDRALAZINE HCL 20 MG/ML IJ SOLN: 5.0000 mg | Freq: Three times a day (TID) | INTRAMUSCULAR | Status: AC | PRN

## 2022-11-21 MED ORDER — UMECLIDINIUM BROMIDE 62.5 MCG/ACT IN AEPB
1.0000 | INHALATION_SPRAY | Freq: Every day | RESPIRATORY_TRACT | Status: DC
  Administered 2022-11-24 – 2022-11-26 (×3): 1 via RESPIRATORY_TRACT
  Filled 2022-11-21 (×2): qty 7

## 2022-11-21 MED ORDER — POTASSIUM CHLORIDE CRYS ER 20 MEQ PO TBCR
20.0000 meq | EXTENDED_RELEASE_TABLET | Freq: Every day | ORAL | Status: DC
  Administered 2022-11-22: 20 meq via ORAL
  Filled 2022-11-21: qty 1

## 2022-11-21 MED ORDER — ENOXAPARIN SODIUM 40 MG/0.4ML IJ SOSY
40.0000 mg | PREFILLED_SYRINGE | Freq: Every day | INTRAMUSCULAR | Status: DC
  Administered 2022-11-21 – 2022-11-25 (×5): 40 mg via SUBCUTANEOUS
  Filled 2022-11-21 (×5): qty 0.4

## 2022-11-21 MED ORDER — CARBIDOPA-LEVODOPA ER 25-100 MG PO TBCR: 1.0000 | EXTENDED_RELEASE_TABLET | ORAL | Status: DC

## 2022-11-21 MED ORDER — AMLODIPINE BESYLATE 5 MG PO TABS
2.5000 mg | ORAL_TABLET | Freq: Every day | ORAL | Status: DC
  Administered 2022-11-22 – 2022-11-26 (×5): 2.5 mg via ORAL
  Filled 2022-11-21 (×5): qty 1

## 2022-11-21 MED ORDER — FUROSEMIDE 10 MG/ML IJ SOLN
20.0000 mg | Freq: Once | INTRAMUSCULAR | Status: AC
  Administered 2022-11-21: 20 mg via INTRAVENOUS
  Filled 2022-11-21: qty 4

## 2022-11-21 MED ORDER — ASPIRIN 81 MG PO TBEC
81.0000 mg | DELAYED_RELEASE_TABLET | Freq: Every day | ORAL | Status: DC
  Administered 2022-11-22 – 2022-11-26 (×5): 81 mg via ORAL
  Filled 2022-11-21 (×5): qty 1

## 2022-11-21 MED ORDER — POTASSIUM CHLORIDE 20 MEQ PO PACK
40.0000 meq | PACK | ORAL | Status: AC
  Administered 2022-11-21: 40 meq via ORAL
  Filled 2022-11-21: qty 2

## 2022-11-21 MED ORDER — IOHEXOL 350 MG/ML SOLN
75.0000 mL | Freq: Once | INTRAVENOUS | Status: AC | PRN
  Administered 2022-11-21: 75 mL via INTRAVENOUS

## 2022-11-21 MED ORDER — FUROSEMIDE 10 MG/ML IJ SOLN
20.0000 mg | Freq: Once | INTRAMUSCULAR | Status: AC
  Administered 2022-11-22: 20 mg via INTRAVENOUS
  Filled 2022-11-21: qty 4

## 2022-11-21 MED ORDER — ACETAMINOPHEN 325 MG PO TABS
650.0000 mg | ORAL_TABLET | Freq: Four times a day (QID) | ORAL | Status: AC | PRN
  Filled 2022-11-21: qty 2

## 2022-11-21 MED ORDER — CARBIDOPA-LEVODOPA ER 25-100 MG PO TBCR
2.0000 | EXTENDED_RELEASE_TABLET | Freq: Every day | ORAL | Status: DC
  Administered 2022-11-22 – 2022-11-26 (×5): 2 via ORAL
  Filled 2022-11-21 (×2): qty 2
  Filled 2022-11-21: qty 1
  Filled 2022-11-21 (×2): qty 2

## 2022-11-21 MED ORDER — CARBIDOPA-LEVODOPA ER 25-100 MG PO TBCR
1.0000 | EXTENDED_RELEASE_TABLET | Freq: Every evening | ORAL | Status: DC
  Administered 2022-11-21 – 2022-11-25 (×5): 1 via ORAL
  Filled 2022-11-21 (×6): qty 1

## 2022-11-21 MED ORDER — CARBIDOPA-LEVODOPA ER 25-100 MG PO TBCR
1.0000 | EXTENDED_RELEASE_TABLET | Freq: Every day | ORAL | Status: DC
  Administered 2022-11-22 – 2022-11-26 (×5): 1 via ORAL
  Filled 2022-11-21 (×5): qty 1

## 2022-11-21 MED ORDER — CLONAZEPAM 0.5 MG PO TABS
0.5000 mg | ORAL_TABLET | Freq: Every day | ORAL | Status: DC
  Administered 2022-11-21 – 2022-11-25 (×5): 0.5 mg via ORAL
  Filled 2022-11-21 (×5): qty 1

## 2022-11-21 MED ORDER — GABAPENTIN 300 MG PO CAPS
600.0000 mg | ORAL_CAPSULE | Freq: Three times a day (TID) | ORAL | Status: DC
  Administered 2022-11-21 – 2022-11-26 (×16): 600 mg via ORAL
  Filled 2022-11-21 (×16): qty 2

## 2022-11-21 MED ORDER — ALBUTEROL SULFATE (2.5 MG/3ML) 0.083% IN NEBU: 3.0000 mL | INHALATION_SOLUTION | Freq: Four times a day (QID) | RESPIRATORY_TRACT | Status: DC | PRN

## 2022-11-21 NOTE — ED Provider Notes (Signed)
[facility] Provider Note    Event Date/Time   First MD Initiated Contact with Patient 11/21/22 [phone]     (approximate)   History   Respiratory Distress   HPI  Crystal Bass is a 81 y.o. adult     Reviewed primary care note from October 31 patient has a history of chronic shortness of breath, vitamin D deficiency and urinary tract infection.  Dyspnea the past is full to be multifocal with mitral regurgitation chronic heart failure with persistent preserved EF, postop anemia and a recent hip surgery   Reports for about 1 week has had shortness of breath especially when exerting herself or moving about.  Also noticed increased swelling in both of her legs.  Had orthopedic surgery a couple months ago after a hip injury.  She does report a remote history of a blood clot in one of her legs in the distant past but is not taking any anticoagulant  She denies chest pain.  She has had a cough but no fever.  Slowly worsening shortness of breath worse when she lays down, and leg swelling has become more prominent  Physical Exam   Triage Vital Signs: ED Triage Vitals  Enc Vitals Group     BP      Pulse      Resp      Temp      Temp src      SpO2      Weight      Height      Head Circumference      Peak Flow      Pain Score      Pain Loc      Pain Edu?      Excl. in [place]?     Most recent vital signs: Vitals:   11/21/22 1230 11/21/22 1300  BP: 126/70 (!) 144/90  Pulse: 77 80  Resp: (!) 28 (!) 24  Temp:    SpO2: 98% 100%     General: Awake, no distress.  She does have a 2 L oxygen requirement to maintain saturation about 94%.  Her work of breathing does not appear increased at this time there is no accessory muscle use. CV:  Good peripheral perfusion.  Normal rate, no obvious murmur though her second heart sound does seem somewhat abrupt Resp:  Normal effort.  Clear lungs bilaterally.  No wheezing.  Diminished lung sounds in the bases bilaterally  but no obvious rales Abd:  No distention.  Soft nontender nondistended Other:  Moderate bilateral pitting edema at the level of the knees bilaterally patient reports that something has been increasing slowly over the last 2 weeks   ED Results / Procedures / Treatments   Labs (all labs ordered are listed, but only abnormal results are displayed) Labs Reviewed  BASIC METABOLIC PANEL - Abnormal; Notable for the following components:      Result Value   Potassium 3.0 (*)    Glucose, Bld 107 (*)    Calcium 8.8 (*)    All other components within normal limits  CBC - Abnormal; Notable for the following components:   Hemoglobin 11.9 (*)    Platelets 147 (*)    All other components within normal limits  BRAIN NATRIURETIC PEPTIDE - Abnormal; Notable for the following components:   B Natriuretic Peptide 719.9 (*)    All other components within normal limits  RESP PANEL BY RT-PCR (FLU A&B, COVID) ARPGX2  PROCALCITONIN  PHOSPHORUS  MAGNESIUM  TROPONIN I (HIGH SENSITIVITY)  TROPONIN I (HIGH SENSITIVITY)     EKG  Interpreted by me at 9:30 AM heart rate 80 QRS 90 QTc 460 Normal sinus rhythm, diffuse ST segment depression in multiple leads, concerning for possible subendocardial ischemia or ischemic picture or heart strain, no STEMI   RADIOLOGY Chest x-ray interpreted by me as cardiomegaly, central vascular prominence questionable pulmonary edema versus infiltrate.  CTA    PROCEDURES:  Critical Care performed: No  Procedures   MEDICATIONS ORDERED IN ED: Medications  acetaminophen (TYLENOL) tablet 650 mg (has no administration in time range)    Or  acetaminophen (TYLENOL) suppository 650 mg (has no administration in time range)  ondansetron (ZOFRAN) tablet 4 mg (has no administration in time range)    Or  ondansetron (ZOFRAN) injection 4 mg (has no administration in time range)  enoxaparin (LOVENOX) injection 40 mg (has no administration in time range)  senna-docusate  (Senokot-S) tablet 1 tablet (has no administration in time range)  furosemide (LASIX) injection 20 mg (has no administration in time range)  aspirin EC tablet 81 mg (has no administration in time range)  amLODipine (NORVASC) tablet 2.5 mg (has no administration in time range)  pantoprazole (PROTONIX) EC tablet 20 mg (has no administration in time range)  ferrous sulfate tablet 325 mg (has no administration in time range)  clonazePAM (KLONOPIN) tablet 0.5 mg (has no administration in time range)  gabapentin ([name]) capsule 600 mg (has no administration in time range)  potassium chloride SA (KLOR-CON M) CR tablet 20 mEq (has no administration in time range)  albuterol (PROVENTIL) (2.5 MG/3ML) 0.083% nebulizer solution 3 mL (has no administration in time range)  fluticasone furoate-vilanterol (BREO ELLIPTA) 100-25 MCG/ACT 1 puff (has no administration in time range)    And  umeclidinium bromide (INCRUSE ELLIPTA) 62.5 MCG/ACT 1 puff (has no administration in time range)  Carbidopa-Levodopa ER (SINEMET CR) 25-100 MG tablet controlled release 2 tablet (has no administration in time range)    And  Carbidopa-Levodopa ER (SINEMET CR) 25-100 MG tablet controlled release 1 tablet (has no administration in time range)    And  Carbidopa-Levodopa ER (SINEMET CR) 25-100 MG tablet controlled release 1 tablet (has no administration in time range)  hydrALAZINE (APRESOLINE) injection 5 mg (has no administration in time range)  iohexol (OMNIPAQUE) 350 MG/ML injection 75 mL (75 mLs Intravenous Contrast Given 11/21/22 1034)  furosemide (LASIX) injection 20 mg (20 mg Intravenous Given 11/21/22 1254)  potassium chloride (KLOR-CON) packet 40 mEq (40 mEq Oral Given 11/21/22 1254)     IMPRESSION / MDM / ASSESSMENT AND PLAN / ED COURSE  I reviewed the triage vital signs and the nursing notes.                              Differential diagnosis includes, but is not limited to, volume overload, CHF, no obvious  infectious symptoms but other causes such as viral or pneumonia are considered though seemingly less likely based on clinical history and examination, also wish to exclude pulmonary embolism given recent orthopedic surgery and history of reported previous blood clot.  EKG concerning for ischemic picture but clinical symptoms seem to suggest against an acute STEMI or associated chest pain that would be indicative of an acute coronary syndrome at this time.  Await troponin.  Further testing including BNP etc.  Patient will require admission as she has a mild oxygen deficit.  No wheezing.  Patient's  presentation is most consistent with acute presentation with potential threat to life or bodily function.   The patient is on the cardiac monitor to evaluate for evidence of arrhythmia and/or significant heart rate changes.   Patient admitted to the care of Dr. Tobie Poet     FINAL CLINICAL IMPRESSION(S) / ED DIAGNOSES   Final diagnoses:  Hypervolemia, unspecified hypervolemia type  Hypoxia  Lung mass  Pleural effusion     Rx / DC Orders   ED Discharge Orders     None        Note:  This document was prepared using Dragon voice recognition software and may include unintentional dictation errors.   Delman Kitten, MD 11/21/22 [phone]

## 2022-11-21 NOTE — ED Notes (Signed)
Pt back from CT and on monitor. Pt remains on 2L [place] for comfort

## 2022-11-21 NOTE — Assessment & Plan Note (Signed)
-   MOST form at bedside, image of MOST form uploaded to media.

## 2022-11-21 NOTE — ED Triage Notes (Signed)
Pt comes in via [facility] in respiratory distress since yesterday 12/26. Pt initially had an oxygen saturation of 88% Pt received 2.5 duoneb, 125 solumedrol and was placed on 2L of oxygen with a saturation on 98%.  Pt is in early heart failure and has a history of COPD, emphysema,a Left side mastectomy, and takes medication for parkinson's.  Pt is a DNR  Hr 72 Bp 137/89 98% 2L nasal cannula RR 33

## 2022-11-21 NOTE — Assessment & Plan Note (Addendum)
-   Strict I's and O's - Status post furosemide 20 mg IV per EDP - Ordered furosemide 20 mg IV one-time dose for 11/22/2022 - Patient's last echo/TEE was 05/14/2022, I have not ordered an additional echo - Admit to telemetry cardiac, inpatient

## 2022-11-21 NOTE — Assessment & Plan Note (Signed)
-   Resumed home amlodipine 2.5 mg daily

## 2022-11-21 NOTE — Telephone Encounter (Signed)
[place] Night - Client TELEPHONE ADVICE RECORD [facility] Patient Name: Crystal Bass Gender: Female DOB: 07/20/1941 Age: 81 Y 68 M 9 D Return Phone Number: [phone] (Primary) Address: City/ State/ Zip: [place]  [address] Client [place] Night - Client Client Site [place] Provider [name] - MD Contact Type Call Who Is Calling Patient / Member / Family / Caregiver Call Type Triage / Clinical Relationship To Patient Self Return Phone Number [phone] (Primary) Chief Complaint BREATHING - shortness of breath or sounds breathless Reason for Call Symptomatic / Request for [place] states she is having difficulty breathing. Translation No Nurse Assessment Nurse: Leilani Merl, RN, Heather Date/Time (Eastern Time): 11/21/2022 7:34:51 AM Confirm and document reason for call. If symptomatic, describe symptoms. ---Caller states that she started with diff breathing a while ago but it got worse 2 nights ago. Does the patient have any new or worsening symptoms? ---Yes Will a triage be completed? ---Yes Related visit to physician within the last 2 weeks? ---No Does the PT have any chronic conditions? (i.e. diabetes, asthma, this includes High risk factors for pregnancy, etc.) ---Yes List chronic conditions. ---COPD, asthma Is this a behavioral health or substance abuse call? ---No Guidelines Guideline Title Affirmed Question Affirmed Notes Nurse Date/Time (Eastern Time) Breathing Difficulty [1] MODERATE difficulty breathing (e.g., speaks in phrases, SOB even at rest, pulse 100-120) AND [2] NEW-onset or WORSE than normal [place], RN, Nira Conn 11/21/2022 7:35:50 AM Disp. Time [name] Time) Disposition Final User 11/21/2022 7:32:04 AM Send to Urgent Sima Matas, Danielle PLEASE NOTE: All timestamps contained within this report  are represented as [place] Standard Time. CONFIDENTIALTY NOTICE: This fax transmission is intended only for the addressee. It contains information that is legally privileged, confidential or otherwise protected from use or disclosure. If you are not the intended recipient, you are strictly prohibited from reviewing, disclosing, copying using or disseminating any of this information or taking any action in reliance on or regarding this information. If you have received this fax in error, please notify us immediately by telephone so that we can arrange for its return to [place]. Phone: [phone], Toll-Free: [phone], Fax: [phone] Page: 2 of 2 Call Id: [id number] [place]. Time [name] Time) Disposition Final User 11/21/2022 7:37:58 AM Go to ED Now Yes Leilani Merl, RN, Nira Conn Final Disposition 11/21/2022 7:37:58 AM Go to ED Now Yes Stand[place], RN, Soyla Murphy Disagree/Comply Disagree Caller Understands Yes PreDisposition Call Doctor Care Advice Given Per Guideline GO TO ED NOW: * You need to be seen in the Emergency Department. CARE ADVICE given per Breathing Difficulty (Adult) guideline. Referrals GO TO FACILITY REFUSE

## 2022-11-21 NOTE — Assessment & Plan Note (Signed)
-   Resumed carbidopa-levodopa home dosing

## 2022-11-21 NOTE — Assessment & Plan Note (Signed)
-   With insomnia - Resumed home Klonopin 0.5 mg nightly

## 2022-11-21 NOTE — H&P (Signed)
History and Physical   CORRETTA MUNCE MLY:[id number] DOB: 04/30/1941 DOA: 11/21/2022  PCP: [name], MD  Outpatient Specialists: [facility] cardiology Patient coming from: [facility]  I have personally briefly reviewed patient's old medical records in [place].  Chief Concern: Shortness of breath  HPI: Ms. Crystal Bass is a 81 year old female with history of hypertension, anxiety, GERD, heart failure preserved ejection fraction, Parkinson's disease, COPD, asthma, who presents to the emergency department for chief concerns of shortness of breath.  Initial vitals in the ED showed temperature 98, respiration of 21, heart rate of 73, blood pressure 129/63, SpO2 of 98% on room air.  Serum sodium is 139, potassium 3.0, chloride 103, bicarb 28, BUN of 14, serum creatinine of 0.55, nonfasting blood glucose 107, EGFR greater than 60, WBC 4.4, hemoglobin 11.9, platelets of 147.  BNP was elevated at 719.9.  High sensitive troponin was 16 on repeat was 15.  Procalcitonin was less than 0.10.  ED treatment: Furosemide 20 mg IV one-time dose, potassium chloride 40 mill COVID p.o. ---------------------- At bedside patient is able to tell me her name, her age, current location, the current calendar year.  She reports that over the last 3 days she has been feeling shortness of breath worsened today, prompting her to present to the emergency department for evaluation.  She endorses worsening bilateral lower extremity swelling over the last 2 to 3 days.  She endorses increased salt intake and fluid intake over the last few days due to the holidays. She denies cough.  She denies known sick contacts, nausea, vomiting, fever, chills, chest pain, abdominal pain, dysuria, hematuria, diarrhea.  Social history: Patient lives alone at [facility].  She denies tobacco, EtOH, recreational drug use.  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  ROS: Constitutional: no weight change, no fever ENT/Mouth: no sore throat, no rhinorrhea Eyes: no eye pain, no vision changes Cardiovascular: no chest pain, + dyspnea,  + edema, no palpitations Respiratory: no cough, no sputum, no wheezing Gastrointestinal: no nausea, no vomiting, no diarrhea, no constipation Genitourinary: no urinary incontinence, no dysuria, no hematuria Musculoskeletal: no arthralgias, no myalgias Skin: no skin lesions, no pruritus, Neuro: + weakness, no loss of consciousness, no syncope Psych: no anxiety, no depression, no decrease appetite Heme/Lymph: no bruising, no bleeding  ED Course: Discussed with emergency medicine provider, patient requiring hospitalization for heart failure exacerbation.  Assessment/Plan  Principal Problem:   Acute exacerbation of CHF (congestive heart failure) (HCC) Active Problems:   Essential hypertension   Chronic obstructive airway disease with asthma (HCC)   DNR (do not resuscitate)   Parkinson disease   [name] (mycobacterium avium-intracellulare) infection ([place])   Severe mitral regurgitation   S/P mitral valve clip implantation   Anxiety   Assessment and Plan:  * Acute exacerbation of CHF (congestive heart failure) (HCC) - Strict I's and O's - Status post furosemide 20 mg IV per EDP - Ordered furosemide 20 mg IV one-time dose for 11/22/2022 - Patient's last echo/TEE was 05/14/2022, I have not ordered an additional echo - Admit to telemetry cardiac, inpatient  Essential hypertension - Resumed home amlodipine 2.5 mg daily  Chronic obstructive airway disease with asthma ([place]) - Does not appear to be in acute exacerbation at this time - Resumed home Breo Ellipta, maintenance inhaler - Albuterol nebulizer every 6 hours as needed for wheezing and shortness of breath resumed  Anxiety - With insomnia - Resumed home Klonopin  0.5 mg nightly  Parkinson disease - Resumed carbidopa-levodopa home dosing  DNR  (do not resuscitate) - MOST form at bedside, image of MOST form uploaded to media.  Chart reviewed.   DVT prophylaxis: Enoxaparin Code Status: DNR/DNI Diet: heart healthy Family Communication: a phone call was offered, patient declined stating that she ready let her family know. Disposition Plan: Pending clinical course Consults called: None at this time Admission status: Telemetry cardiac, inpatient  Past Medical History:  Diagnosis Date   Aneurysm of aorta ([place])    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   Anginal pain ([place])    a. 10/2021 Cath: Nl cors.   Aortic root aneurysm (HCC)    Arthritis    Asthma    Breast cancer ([place]) 2002   infiltrative ductal carcinoma    Cataract 2019   Chronic heart failure with preserved ejection fraction (HFpEF) (HCC)    COPD (chronic obstructive pulmonary disease) ([place]) 07/2007   Ductal carcinoma of breast, estrogen receptor positive, stage 1 ([place]) 09/16/2012   Endometriosis    GERD (gastroesophageal reflux disease)    Hypercalcemia 09/14/2013   Hypertension    Lesion of breast 1992   right, benign   Lung disease    secondary to [name] infection   Mastalgia 05/1994   Mitral regurgitation    a. 10/2021 RHC: PCWP V wave of 34; b. 01/2022 TEE: Severe flail/prolapse of large area of A2 scallop with severe posteriorly directed MR; c. 01/2022 TEER w /Mitraclip XTW x 2; d. 07/2022 Echo: EF 60-65%. No rwma, mild LVH, GrIII DD. Sev dil LA. Mod elev PASP. Mod-Sev MR. Mod MS.   Osteoporosis, post-menopausal 03/14/2012   Ovarian tumor of borderline malignancy, right 2004   Palpitations    a. 02/2022 Zio: Predominantly sinus rhythm at 67 (52-171).  2 runs of NSVT -fastest 9 beats at 167.  35 SVT runs-longest 19 beats, fastest 171x6 beats.   Pancreatitis    secondary to Cholelithiasis   Parkinson disease 02/10/2015   Post-thoracotomy pain syndrome    Pseudomonas pneumonia ([place]) 03/2008   Status post implantation of mitral valve leaflet clip 02/15/2022   s/p  XTW MitraClip x2 by Dr. Burt Knack in the A2/P2 position   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   APPENDECTOMY  2004   BREAST BIOPSY  08/2004   right breast-benign   BREAST LUMPECTOMY  1992   benign   BUBBLE STUDY  12/27/2021   Procedure: BUBBLE STUDY;  Surgeon: Geralynn Rile, MD;  Location: [facility] ENDOSCOPY;  Service: Cardiovascular;;   CARDIAC CATHETERIZATION  2007   essentially negation for significant CAD   CHOLECYSTECTOMY  2001   COLONOSCOPY  2014   Dr [name] , due 2019   COLONOSCOPY  09/2018   2 TA removed, int hem, no f/u needed (Dr [name])    ESOPHAGOGASTRODUODENOSCOPY  09/2018   GERD with esophagitis and stricture dilated, antral gastritis negative for H pylori [name])   HIP ARTHROPLASTY Left 08/21/2022   Procedure: ARTHROPLASTY BIPOLAR HIP (HEMIARTHROPLASTY);  Surgeon: Thornton Park, MD;  Location: [facility] ORS;  Service: Orthopedics;  Laterality: Left;   LUNG BIOPSY  2002   [name], Dr [name]   MASTECTOMY MODIFIED RADICAL Left 2002  oral chemotheraphy (tamoxifen then Armidex) no radiation, Dr.[name]   MITRAL VALVE REPAIR N/A 02/15/2022   Procedure: MITRAL VALVE REPAIR;  Surgeon: Sherren Mocha, MD;  Location: [place] CV LAB;  Service: Cardiovascular;  Laterality: N/A;   RIGHT/LEFT HEART CATH AND CORONARY ANGIOGRAPHY Bilateral 11/14/2021   Procedure: RIGHT/LEFT HEART CATH AND CORONARY ANGIOGRAPHY;  Surgeon: Wellington Hampshire, MD;  Location: [place] CV LAB;  Service: Cardiovascular;  Laterality: Bilateral;   TEE WITHOUT CARDIOVERSION N/A 12/29/2018   Procedure: TRANSESOPHAGEAL ECHOCARDIOGRAM (TEE);  Surgeon: Wellington Hampshire, MD;  Location: [facility] ORS;  Service: Cardiovascular;  Laterality: N/A;   TEE WITHOUT  CARDIOVERSION N/A 12/27/2021   Procedure: TRANSESOPHAGEAL ECHOCARDIOGRAM (TEE);  Surgeon: Geralynn Rile, MD;  Location: [place];  Service: Cardiovascular;  Laterality: N/A;   TEE WITHOUT CARDIOVERSION N/A 02/15/2022   Procedure: TRANSESOPHAGEAL ECHOCARDIOGRAM (TEE);  Surgeon: Sherren Mocha, MD;  Location: [place] CV LAB;  Service: Cardiovascular;  Laterality: N/A;   TEE WITHOUT CARDIOVERSION N/A 05/14/2022   Procedure: TRANSESOPHAGEAL ECHOCARDIOGRAM (TEE);  Surgeon: Geralynn Rile, MD;  Location: [place];  Service: Cardiovascular;  Laterality: N/A;   TONSILLECTOMY  1946   Social History:  reports that she has never smoked. She has never used smokeless tobacco. She reports that she does not drink alcohol and does not use drugs.  Allergies  Allergen Reactions   Sulfa Antibiotics Anaphylaxis   Sulfonamide Derivatives Anaphylaxis   Topamax [Topiramate] Other (See Comments)    Metabolic acidosis    Clarithromycin Other (See Comments)    pericarditis Other reaction(s): Not available   Hydrocodone Other (See Comments)    "hyper and climbing the walls"   Motrin [Ibuprofen] Other (See Comments)    headaches   Misc. Sulfonamide Containing Compounds     Other reaction(s): Not available   Symbicort [Budesonide-Formoterol Fumarate] Other (See Comments)    02/07/15 tremor   Tetracycline Hcl Other (See Comments)   Percocet [Oxycodone-Acetaminophen] Itching and Rash   Tape Itching and Rash    Use paper tape only   Tetracyclines & Related Other (See Comments)    "immediate yeast infection"   Family History  Problem Relation Age of Onset   Emphysema Mother        d.64 was never a smoker   Lung cancer Father 42       d.82 history of smoking   Breast cancer Sister 43   Colon cancer Sister    Melanoma Maternal Aunt    Liver cancer Maternal Aunt    Cancer Maternal Uncle        unspecified type   Cancer Maternal Uncle        unspecified type   Cancer Maternal Uncle         unspecified type   Stroke Paternal Aunt        in mid 38s   Osteoporosis Paternal Aunt    Myasthenia gravis Paternal Aunt    Cancer Maternal Grandmother        d.82s unspecified GI cancer   Cancer Maternal Grandfather        d.62s unspecified type   Breast cancer Cousin        d.60s-daughter of unaffected paternal aunt Eustace Moore   Colon cancer Cousin 74       d.70-daughter of unaffected maternal aunt Leila   Lung cancer Cousin 16       d.70-sisters to each other, both daughters of maternal uncle Johnny   Parkinson's disease Cousin    Diabetes Neg  Hx    Heart disease Neg Hx    Stomach cancer Neg Hx    Ulcerative colitis Neg Hx    Family history: Family history reviewed and not pertinent  Prior to Admission medications   Medication Sig Start Date End Date Taking? Authorizing Provider  acetaminophen (TYLENOL) 500 MG tablet Take 1,000 mg by mouth every 12 (twelve) hours as needed.   Yes [provider]  amLODipine (NORVASC) 2.5 MG tablet Take 1 tablet (2.5 mg total) by mouth daily. 08/09/22  Yes Wellington Hampshire, MD  aspirin 81 MG tablet Take 1 tablet (81 mg total) by mouth daily. 01/27/19  Yes [name], MD  Carbidopa-Levodopa ER (SINEMET CR) 25-100 MG tablet controlled release Take 1-2 tablets by mouth 3 (three) times daily. 11/06/22  Yes [provider]  clonazePAM (KLONOPIN) 0.5 MG tablet Take 0.5 mg by mouth at bedtime.   Yes [provider]  ferrous sulfate 325 (65 FE) MG tablet Take 1 tablet (325 mg total) by mouth daily with breakfast. 10/08/22  Yes [name], MD  fexofenadine (ALLEGRA) 180 MG tablet Take 180 mg by mouth daily.   Yes [provider]  Fluticasone-Umeclidin-Vilant (TRELEGY ELLIPTA) 100-62.5-25 MCG/ACT AEPB Inhale 1 puff into the lungs daily. 04/27/22  Yes Cobb, Karie Schwalbe, NP  furosemide (LASIX) 20 MG tablet Take 1 tablet (20 mg total) by mouth daily. 04/30/22  Yes Eileen Stanford, PA-C  gabapentin ([name])  300 MG capsule TAKE 2 CAPSULES BY MOUTH 3 TIMES DAILY Patient taking differently: Take 600 mg by mouth 3 (three) times daily. TAKE 2 CAPSULES BY MOUTH 3 TIMES DAILY 06/04/22  Yes [name], MD  pantoprazole (PROTONIX) 20 MG tablet Take 20 mg by mouth daily.   Yes [provider]  potassium chloride SA (KLOR-CON M) 20 MEQ tablet Take 1 tablet (20 mEq total) by mouth daily. 06/15/22  Yes Kathyrn Drown D, NP  senna (SENOKOT) 8.6 MG TABS tablet Take 1 tablet by mouth 2 (two) times daily. 09/25/22  Yes [name], MD  albuterol (VENTOLIN HFA) 108 (90 Base) MCG/ACT inhaler Inhale 2 puffs into the lungs every 6 (six) hours as needed for wheezing or shortness of breath. 06/04/22   [name], MD  carbidopa-levodopa ([name]) 25-100 MG tablet Take 1 tablet by mouth 2 (two) times daily. See other 3 listings all reflected from [place] Patient not taking: Reported on 11/21/2022    [provider]  cephALEXin (KEFLEX) 500 MG capsule Take 1 capsule (500 mg total) by mouth 3 (three) times daily. Patient not taking: Reported on 11/21/2022 09/21/22   [name], MD  Cholecalciferol (VITAMIN D3) 25 MCG (1000 UT) CAPS Take 1 capsule (1,000 Units total) by mouth daily. 09/27/22   [name], MD  enoxaparin (LOVENOX) 40 MG/0.4ML injection Inject 0.4 mLs (40 mg total) into the skin daily for 14 days. Patient not taking: Reported on 11/21/2022 08/25/22   Max Sane, MD  EPINEPHrine 0.3 mg/0.3 mL IJ SOAJ injection Inject 0.3 mg into the muscle as needed for anaphylaxis. 02/25/20   [provider]   Physical Exam: Vitals:   11/21/22 1130 11/21/22 1200 11/21/22 1230 11/21/22 1300  BP: (!) 141/84 127/77 126/70 (!) 144/90  Pulse: 77 74 77 80  Resp: (!) 26 (!) 25 (!) 28 (!) 24  Temp:      TempSrc:      SpO2: 98% 97% 98% 100%   Constitutional: appears age-appropriate, frail, NAD, calm, comfortable  Eyes: PERRL, lids and  conjunctivae normal ENMT: Mucous membranes are moist. Posterior pharynx clear of any exudate or lesions. Age-appropriate dentition. Hearing appropriate Neck: normal, supple, no masses, no thyromegaly Respiratory: generalized decreased lung sounds bilaterally, no wheezing, no crackles. Mild increase respiratory effort. No accessory muscle use. Nasal cannula in place Cardiovascular: Regular rate and rhythm, no murmurs / rubs / gallops. Bilateral trace lower extremity edema. 2+ pedal pulses. No carotid bruits.  Abdomen: no tenderness, no masses palpated, no hepatosplenomegaly. Bowel sounds positive.  Musculoskeletal: no clubbing / cyanosis. No joint deformity upper and lower extremities. Good ROM, no contractures, no atrophy. Normal muscle tone.  Skin: no rashes, lesions, ulcers. No induration Neurologic: Sensation intact. Strength 5/5 in all 4.  Psychiatric: Normal judgment and insight. Alert and oriented x 3. Normal mood.   EKG: independently reviewed, showing sinus rhythm with rate of 77, QTc 464  Chest x-ray on Admission: I personally reviewed and I agree with radiologist reading as below.  CT Angio Chest PE W and/or Wo Contrast  Result Date: 11/21/2022 CLINICAL DATA:  Dyspnea. EXAM: CT ANGIOGRAPHY CHEST WITH CONTRAST TECHNIQUE: Multidetector CT imaging of the chest was performed using the standard protocol during bolus administration of intravenous contrast. Multiplanar CT image reconstructions and MIPs were obtained to evaluate the vascular anatomy. RADIATION DOSE REDUCTION: This exam was performed according to the departmental dose-optimization program which includes automated exposure control, adjustment of the mA and/or kV according to patient size and/or use of iterative reconstruction technique. CONTRAST:  60m OMNIPAQUE IOHEXOL 350 MG/ML SOLN COMPARISON:  February 03, 2022. FINDINGS: Cardiovascular: Satisfactory opacification of the pulmonary arteries to the segmental level. No evidence of  pulmonary embolism. Moderate cardiomegaly is noted with significant left atrial enlargement. No pericardial effusion. Mediastinum/Nodes: No enlarged mediastinal, hilar, or axillary lymph nodes. Thyroid gland, trachea, and esophagus demonstrate no significant findings. Lungs/Pleura: No pneumothorax is noted. Stable right upper lobe opacity is noted most consistent with postoperative change chronic scarring. Small to moderate size right pleural effusion is noted. Small left pleural effusion is noted. Mosaic pattern is noted throughout both lungs which may represent multifocal inflammation or air trapping secondary to small airways disease. Bibasilar subsegmental atelectasis is noted. 10 x 5 mm irregular density is noted in the right lower lobe best seen on image number 57 of series 5 which is not changed compared to prior exam. Upper Abdomen: Small nonobstructive right renal calculus. Musculoskeletal: No chest wall abnormality. No acute or significant osseous findings. Review of the MIP images confirms the above findings. IMPRESSION: No definite evidence of pulmonary embolus. 10 x 5 mm irregular density is noted in right lower lobe which is not significantly changed compared to prior exam. Neoplasm cannot be excluded. Follow-up unenhanced chest CT in 6-12 months is recommended. Small to moderate size right pleural effusion is noted. Small left pleural effusion is noted. Mild bibasilar subsegmental atelectasis is noted. Mosaic pattern is noted throughout both lungs which may represent multifocal inflammation or air trapping secondary to small airways disease. Moderate cardiomegaly is noted with significant left atrial enlargement. Small nonobstructive right renal calculus. Aortic Atherosclerosis (ICD10-I70.0). Electronically Signed   By: JMarijo ConceptionM.D.   On: 11/21/2022 10:55   DG Chest 2 View  Result Date: 11/21/2022 CLINICAL DATA:  Shortness of breath. EXAM: CHEST - 2 VIEW COMPARISON:  August 24, 2022.  FINDINGS: Stable cardiomegaly with probable central pulmonary vascular congestion. Increased bilateral lung opacities are noted most consistent with pulmonary edema or possibly pneumonia. Left basilar  atelectasis with possible effusion is noted. Bony thorax is unremarkable. IMPRESSION: Stable cardiomegaly with probable central pulmonary vascular congestion. Increased bilateral lung opacities are noted most consistent pulmonary edema or possibly pneumonia. Electronically Signed   By: Marijo Conception M.D.   On: 11/21/2022 10:10    Labs on Admission: I have personally reviewed following labs  CBC: Recent Labs  Lab 11/21/22 0938  WBC 4.4  HGB 11.9*  HCT 38.3  MCV 95.3  PLT 371*   Basic Metabolic Panel: Recent Labs  Lab 11/21/22 0938  NA 139  K 3.0*  CL 103  CO2 28  GLUCOSE 107*  BUN 14  CREATININE 0.55  CALCIUM 8.8*   GFR: CrCl cannot be calculated (Unknown ideal weight.).  BNP (last 3 results) Recent Labs    04/30/22 1249  PROBNP 403   Urine analysis:    Component Value Date/Time   COLORURINE STRAW (A) 06/15/2022 [place] 06/15/2022 1047   LABSPEC 1.008 06/15/2022 1047   PHURINE 6.0 06/15/2022 1047   GLUCOSEU NEGATIVE 06/15/2022 1047   HGBUR NEGATIVE 06/15/2022 1047   BILIRUBINUR negative 09/25/2022 1404   KETONESUR NEGATIVE 06/15/2022 1047   PROTEINUR Negative 09/25/2022 1404   PROTEINUR NEGATIVE 06/15/2022 1047   UROBILINOGEN 0.2 09/25/2022 1404   NITRITE negative 09/25/2022 1404   NITRITE NEGATIVE 06/15/2022 1047   LEUKOCYTESUR Negative 09/25/2022 1404   LEUKOCYTESUR TRACE (A) 06/15/2022 1047   This document was prepared using Dragon Voice Recognition software and may include unintentional dictation errors.  Dr. Tobie Poet [facility]  If 7PM-7AM, please contact overnight-coverage provider If 7AM-7PM, please contact day coverage provider [email or web address]  11/21/2022, 3:26 PM

## 2022-11-21 NOTE — Telephone Encounter (Addendum)
Wells Guiles RN with access nurse said that pt is on the phone with SOB upon rest. Pulse ox around 88% now. Pt sounds SOB on phone. Pt was advised by access nurse to go to ED. Pt said she is afraid she will get more germs and refusing ED. I spoke with pt and pt notified of Dr Synthia Innocent note and pt voiced understanding and will go to ED. I offered to call EMS but pt said she will finish getting dressed and then she will call EMS at [facility]. Sending note to Dr Darnell Level and G pool.   [place] Night - Client TELEPHONE ADVICE RECORD AccessNurse Patient Name: Elloise [place] CHREST Gender: Female DOB: Sep 15, 1941 Age: 81 Y 20 M 9 D Return Phone Number: [phone] (Primary) Address: City/ State/ Zip: [place]  [address] Client [place] Night - Client Client Site [place] Provider [name] - MD Contact Type Call Who Is Calling Patient / Member / Family / Caregiver Call Type Triage / Clinical Relationship To Patient Self Return Phone Number [phone] (Primary) Chief Complaint BREATHING - shortness of breath or sounds breathless Reason for Call Symptomatic / Request for [place] states she is having trouble breathing. Translation No Nurse Assessment Nurse: Radford Pax, RN, Eugene Garnet Date/Time [name] Time): 11/21/2022 8:09:42 AM Confirm and document reason for call. If symptomatic, describe symptoms. ---Caller has already spoke with another nurse but is calling back to speak with someone in office. No change in symptoms at this time. Does the patient have any new or worsening symptoms? ---No Please document clinical information provided and list any resource used. ---Was able to warm transfer to Rina in office for assistance. Disp. Time [name] Time) Disposition Final User 11/21/2022 8:02:30 AM Send to Urgent [place] 11/21/2022 8:11:07 AM Clinical Call Yes  Radford Pax, RN, Eugene Garnet Final Disposition 11/21/2022 8:11:07 AM Clinical Call Yes Radford Pax, RN, Izora Ribas

## 2022-11-21 NOTE — ED Notes (Signed)
Pt transported to xray 

## 2022-11-21 NOTE — Assessment & Plan Note (Signed)
-   Does not appear to be in acute exacerbation at this time - Resumed home Breo Ellipta, maintenance inhaler - Albuterol nebulizer every 6 hours as needed for wheezing and shortness of breath resumed

## 2022-11-21 NOTE — ED Notes (Signed)
Pt transported to CT ?

## 2022-11-21 NOTE — Hospital Course (Addendum)
Ms. Elvina Bosch is a 81 year old female with history of hypertension, anxiety, GERD, heart failure preserved ejection fraction, Parkinson's disease, COPD, asthma, who presents to the emergency department for chief concerns of shortness of breath.  Initial vitals in the ED showed temperature 98, respiration of 21, heart rate of 73, blood pressure 129/63, SpO2 of 98% on room air.  Serum sodium is 139, potassium 3.0, chloride 103, bicarb 28, BUN of 14, serum creatinine of 0.55, nonfasting blood glucose 107, EGFR greater than 60, WBC 4.4, hemoglobin 11.9, platelets of 147.  BNP was elevated at 719.9.  High sensitive troponin was 16 on repeat was 15.  Procalcitonin was less than 0.10.  ED treatment: Furosemide 20 mg IV one-time dose, potassium chloride 40 mill COVID p.o.

## 2022-11-22 DIAGNOSIS — I5033 Acute on chronic diastolic (congestive) heart failure: Secondary | ICD-10-CM | POA: Diagnosis not present

## 2022-11-22 DIAGNOSIS — J9601 Acute respiratory failure with hypoxia: Secondary | ICD-10-CM | POA: Diagnosis not present

## 2022-11-22 LAB — BASIC METABOLIC PANEL
Anion gap: 7 (ref 5–15)
BUN: 15 mg/dL (ref 8–23)
CO2: 30 mmol/L (ref 22–32)
Calcium: 7.7 mg/dL — ABNORMAL LOW (ref 8.9–10.3)
Chloride: 101 mmol/L (ref 98–111)
Creatinine, Ser: 0.46 mg/dL (ref 0.44–1.00)
GFR, Estimated: >60 mL/min (ref >60)
Glucose, Bld: 109 mg/dL — ABNORMAL HIGH (ref 70–99)
Potassium: 3.6 mmol/L (ref 3.5–5.1)
Sodium: 138 mmol/L (ref 135–145)

## 2022-11-22 LAB — CBC
HCT: 34.1 % — ABNORMAL LOW (ref 36.0–46.0)
Hemoglobin: 10.6 g/dL — ABNORMAL LOW (ref 12.0–15.0)
MCH: 29.7 pg (ref 26.0–34.0)
MCHC: 31.1 g/dL (ref 30.0–36.0)
MCV: 95.5 fL (ref 80.0–100.0)
Platelets: 140 10*3/uL — ABNORMAL LOW (ref 150–400)
RBC: 3.57 MIL/uL — ABNORMAL LOW (ref 3.87–5.11)
RDW: 14.5 % (ref 11.5–15.5)
WBC: 4.3 10*3/uL (ref 4.0–10.5)
nRBC: 0 % (ref 0.0–0.2)

## 2022-11-22 LAB — MAGNESIUM: Magnesium: 1.6 mg/dL — ABNORMAL LOW (ref 1.7–2.4)

## 2022-11-22 LAB — PHOSPHORUS: Phosphorus: 2.1 mg/dL — ABNORMAL LOW (ref 2.5–4.6)

## 2022-11-22 MED ORDER — K PHOS MONO-SOD PHOS DI & MONO 155-852-130 MG PO TABS
500.0000 mg | ORAL_TABLET | Freq: Two times a day (BID) | ORAL | Status: AC
  Administered 2022-11-22 (×2): 500 mg via ORAL
  Filled 2022-11-22 (×3): qty 2

## 2022-11-22 MED ORDER — MAGNESIUM SULFATE 2 GM/50ML IV SOLN
2.0000 g | Freq: Once | INTRAVENOUS | Status: AC
  Administered 2022-11-22: 2 g via INTRAVENOUS
  Filled 2022-11-22: qty 50

## 2022-11-22 MED ORDER — FUROSEMIDE 10 MG/ML IJ SOLN
20.0000 mg | Freq: Two times a day (BID) | INTRAMUSCULAR | Status: AC
  Administered 2022-11-22 – 2022-11-23 (×2): 20 mg via INTRAVENOUS
  Filled 2022-11-22 (×2): qty 2

## 2022-11-22 NOTE — Progress Notes (Addendum)
Progress Note    Crystal Bass  NTI:[id number] DOB: 08-19-1941  DOA: 11/21/2022 PCP: [name], MD      Brief Narrative:    Medical records reviewed and are as summarized below:  ERSIE SAVINO is a 81 y.o. adult with multiple medical problems including but not limited to chronic diastolic CHF, Parkinson's disease, COPD, asthma, hypertension, anxiety, GERD, history of breast cancer, aortic aneurysm, left hip arthroplasty in September 2023, s/p mitral valve repair in March 2023, remote history of DVT.  Crystal Bass presented to the hospital because of shortness of breath worse with extension, lower extremity swelling and low oxygen saturation of 88% on room air.  Crystal Bass was admitted to the hospital for acute exacerbation of chronic diastolic CHF and acute hypoxic respiratory failure.  Crystal Bass was treated with IV Lasix and 2 L/min oxygen via nasal cannula.     Assessment/Plan:   Principal Problem:   Acute exacerbation of CHF (congestive heart failure) (HCC) Active Problems:   Essential hypertension   Chronic obstructive airway disease with asthma (HCC)   DNR (do not resuscitate)   Parkinson disease   Severe mitral regurgitation   S/P mitral valve clip implantation   Anxiety   Hypomagnesemia   Hypophosphatemia   Acute respiratory failure with hypoxia (HCC)   Acute dissipation of chronic diastolic CHF: Continue IV Lasix.  Monitor BMP, daily weight and urine output.   2D echo on 08/20/2022 showed EF estimated at 60 to 86%, grade 3 diastolic dysfunction, moderate to severe MR, moderate mitral stenosis, s/p mitral valve clip.  Small to moderate right and small left pleural effusion: This is likely from CHF.  Continue IV Lasix  Acute hypoxic respiratory failure: Continue 2 L/min oxygen via nasal cannula and taper off oxygen as able.  Hypomagnesemia: Replete magnesium with IV magnesium sulfate and monitor electrolytes  Hypophosphatemia: Replete phosphorus with oral potassium  phosphate  Thrombocytopenia: Repeat platelet count tomorrow  Right lower lobe lung nodule 10 x 5 mm: Follow-up with repeat CT chest in 6 to 12 months was recommended.  Patient said that this has been "there for years".  Parkinson's disease: Continue carbidopa/levodopa   Other comorbidities include history of COPD, asthma, right hip arthroplasty in September 2023       [place] Room service appropriate? Yes; Fluid consistency: Thin  Diet effective now                            Consultants: None  Procedures: None    Medications:    amLODipine  2.5 mg Oral Daily   aspirin EC  81 mg Oral Daily   Carbidopa-Levodopa ER  2 tablet Oral Daily   And   Carbidopa-Levodopa ER  1 tablet Oral Q1500   And   Carbidopa-Levodopa ER  1 tablet Oral QPM   clonazePAM  0.5 mg Oral QHS   enoxaparin (LOVENOX) injection  40 mg Subcutaneous QHS   ferrous sulfate  325 mg Oral Q breakfast   fluticasone furoate-vilanterol  1 puff Inhalation Daily   And   umeclidinium bromide  1 puff Inhalation Daily   furosemide  20 mg Intravenous BID   gabapentin  600 mg Oral TID   pantoprazole  20 mg Oral Daily   phosphorus  500 mg Oral BID   potassium chloride SA  20 mEq Oral Daily   Continuous Infusions:  Anti-infectives (From admission, onward)    None              Family Communication/Anticipated D/C date and plan/Code Status   DVT prophylaxis: enoxaparin (LOVENOX) injection 40 mg Start: 11/21/22 2200 Place TED hose Start: 11/21/22 1329     Code Status: DNR  Family Communication: None Disposition Plan: Plan to discharge home tomorrow   Status is: Inpatient Remains inpatient appropriate because: CHF exacerbation, hypoxia       Subjective:   Interval events noted.  Crystal Bass says Crystal Bass feels better than Crystal Bass did yesterday.  Breathing is better.  Crystal Bass has a cough.  Crystal Bass thinks Crystal Bass is well enough to go home today.  Objective:     Vitals:   11/22/22 0531 11/22/22 0630 11/22/22 0728 11/22/22 1100  BP:  (!) 122/102 113/71 110/61  Pulse:  81 81 69  Resp:  (!) 33 20 20  Temp: 98.2 F (36.8 C)  97.7 F (36.5 C)   TempSrc: Oral  Oral   SpO2:  93% 92% 93%   No data found.  No intake or output data in the 24 hours ending 11/22/22 1118 There were no vitals filed for this visit.  Exam:  GEN: NAD SKIN: Warm and dry EYES: EOMI ENT: MMM CV: RRR PULM: Bibasilar rales, decreased air entry at the lung bases.  No wheezing heard ABD: soft, ND, NT, +BS CNS: AAO x 3, non focal EXT: No edema or tenderness        Data Reviewed:   I have personally reviewed following labs and imaging studies:  Labs: Labs show the following:   Basic Metabolic Panel: Recent Labs  Lab 11/21/22 0938 11/22/22 0420  NA 139 138  K 3.0* 3.6  CL 103 101  CO2 28 30  GLUCOSE 107* 109*  BUN 14 15  CREATININE 0.55 0.46  CALCIUM 8.8* 7.7*  MG  --  1.6*  PHOS  --  2.1*   GFR CrCl cannot be calculated (Unknown ideal weight.). Liver Function Tests: No results for input(s): "AST", "ALT", "ALKPHOS", "BILITOT", "PROT", "ALBUMIN" in the last 168 hours. No results for input(s): "LIPASE", "AMYLASE" in the last 168 hours. No results for input(s): "AMMONIA" in the last 168 hours. Coagulation profile No results for input(s): "INR", "PROTIME" in the last 168 hours.  CBC: Recent Labs  Lab 11/21/22 0938 11/22/22 0420  WBC 4.4 4.3  HGB 11.9* 10.6*  HCT 38.3 34.1*  MCV 95.3 95.5  PLT 147* 140*   Cardiac Enzymes: No results for input(s): "CKTOTAL", "CKMB", "CKMBINDEX", "TROPONINI" in the last 168 hours. BNP (last 3 results) Recent Labs    04/30/22 1249  PROBNP 403   CBG: No results for input(s): "GLUCAP" in the last 168 hours. D-Dimer: No results for input(s): "DDIMER" in the last 72 hours. Hgb A1c: No results for input(s): "HGBA1C" in the last 72 hours. Lipid Profile: No results for input(s): "CHOL", "HDL",  "LDLCALC", "TRIG", "CHOLHDL", "LDLDIRECT" in the last 72 hours. Thyroid function studies: No results for input(s): "TSH", "T4TOTAL", "T3FREE", "THYROIDAB" in the last 72 hours.  Invalid input(s): "FREET3" Anemia work up: No results for input(s): "VITAMINB12", "FOLATE", "FERRITIN", "TIBC", "IRON", "RETICCTPCT" in the last 72 hours. Sepsis Labs: Recent Labs  Lab 11/21/22 0938 11/22/22 0420  PROCALCITON <0.10  --   WBC 4.4 4.3    Microbiology Recent Results (from the past 240 hour(s))  Resp Panel by RT-PCR (Flu A&B, Covid) Anterior Nasal Swab     Status: None   Collection Time:  11/21/22 11:19 AM   Specimen: Anterior Nasal Swab  Result Value Ref Range Status   SARS Coronavirus 2 by RT PCR NEGATIVE NEGATIVE Final    Comment: (NOTE) SARS-CoV-2 target nucleic acids are NOT DETECTED.  The SARS-CoV-2 RNA is generally detectable in upper respiratory specimens during the acute phase of infection. The lowest concentration of SARS-CoV-2 viral copies this assay can detect is 138 copies/mL. A negative result does not preclude SARS-Cov-2 infection and should not be used as the sole basis for treatment or other patient management decisions. A negative result may occur with  improper specimen collection/handling, submission of specimen other than nasopharyngeal swab, presence of viral mutation(s) within the areas targeted by this assay, and inadequate number of viral copies(<138 copies/mL). A negative result must be combined with clinical observations, patient history, and epidemiological information. The expected result is Negative.  Fact Sheet for Patients:  EntrepreneurPulse.com.au  Fact Sheet for Healthcare Providers:  IncredibleEmployment.be  This test is no t yet approved or cleared by the [place] FDA and  has been authorized for detection and/or diagnosis of SARS-CoV-2 by FDA under an Emergency Use Authorization (EUA). This EUA will remain   in effect (meaning this test can be used) for the duration of the COVID-19 declaration under Section 564(b)(1) of the Act, 21 U.S.C.section 360bbb-3(b)(1), unless the authorization is terminated  or revoked sooner.       Influenza A by PCR NEGATIVE NEGATIVE Final   Influenza B by PCR NEGATIVE NEGATIVE Final    Comment: (NOTE) The Xpert Xpress SARS-CoV-2/FLU/RSV plus assay is intended as an aid in the diagnosis of influenza from Nasopharyngeal swab specimens and should not be used as a sole basis for treatment. Nasal washings and aspirates are unacceptable for Xpert Xpress SARS-CoV-2/FLU/RSV testing.  Fact Sheet for Patients: EntrepreneurPulse.com.au  Fact Sheet for Healthcare Providers: IncredibleEmployment.be  This test is not yet approved or cleared by the [place] FDA and has been authorized for detection and/or diagnosis of SARS-CoV-2 by FDA under an Emergency Use Authorization (EUA). This EUA will remain in effect (meaning this test can be used) for the duration of the COVID-19 declaration under Section 564(b)(1) of the Act, 21 U.S.C. section 360bbb-3(b)(1), unless the authorization is terminated or revoked.  Performed at [facility], [address], [place], [place] [address]     Procedures and diagnostic studies:  CT Angio Chest PE W and/or Wo Contrast  Result Date: 11/21/2022 CLINICAL DATA:  Dyspnea. EXAM: CT ANGIOGRAPHY CHEST WITH CONTRAST TECHNIQUE: Multidetector CT imaging of the chest was performed using the standard protocol during bolus administration of intravenous contrast. Multiplanar CT image reconstructions and MIPs were obtained to evaluate the vascular anatomy. RADIATION DOSE REDUCTION: This exam was performed according to the departmental dose-optimization program which includes automated exposure control, adjustment of the mA and/or kV according to patient size and/or use of iterative reconstruction  technique. CONTRAST:  27m OMNIPAQUE IOHEXOL 350 MG/ML SOLN COMPARISON:  February 03, 2022. FINDINGS: Cardiovascular: Satisfactory opacification of the pulmonary arteries to the segmental level. No evidence of pulmonary embolism. Moderate cardiomegaly is noted with significant left atrial enlargement. No pericardial effusion. Mediastinum/Nodes: No enlarged mediastinal, hilar, or axillary lymph nodes. Thyroid gland, trachea, and esophagus demonstrate no significant findings. Lungs/Pleura: No pneumothorax is noted. Stable right upper lobe opacity is noted most consistent with postoperative change chronic scarring. Small to moderate size right pleural effusion is noted. Small left pleural effusion is noted. Mosaic pattern is noted throughout both lungs which may represent  multifocal inflammation or air trapping secondary to small airways disease. Bibasilar subsegmental atelectasis is noted. 10 x 5 mm irregular density is noted in the right lower lobe best seen on image number 57 of series 5 which is not changed compared to prior exam. Upper Abdomen: Small nonobstructive right renal calculus. Musculoskeletal: No chest wall abnormality. No acute or significant osseous findings. Review of the MIP images confirms the above findings. IMPRESSION: No definite evidence of pulmonary embolus. 10 x 5 mm irregular density is noted in right lower lobe which is not significantly changed compared to prior exam. Neoplasm cannot be excluded. Follow-up unenhanced chest CT in 6-12 months is recommended. Small to moderate size right pleural effusion is noted. Small left pleural effusion is noted. Mild bibasilar subsegmental atelectasis is noted. Mosaic pattern is noted throughout both lungs which may represent multifocal inflammation or air trapping secondary to small airways disease. Moderate cardiomegaly is noted with significant left atrial enlargement. Small nonobstructive right renal calculus. Aortic Atherosclerosis (ICD10-I70.0).  Electronically Signed   By: Marijo Conception M.D.   On: 11/21/2022 10:55   DG Chest 2 View  Result Date: 11/21/2022 CLINICAL DATA:  Shortness of breath. EXAM: CHEST - 2 VIEW COMPARISON:  August 24, 2022. FINDINGS: Stable cardiomegaly with probable central pulmonary vascular congestion. Increased bilateral lung opacities are noted most consistent with pulmonary edema or possibly pneumonia. Left basilar atelectasis with possible effusion is noted. Bony thorax is unremarkable. IMPRESSION: Stable cardiomegaly with probable central pulmonary vascular congestion. Increased bilateral lung opacities are noted most consistent pulmonary edema or possibly pneumonia. Electronically Signed   By: Marijo Conception M.D.   On: 11/21/2022 10:10               LOS: 1 day   Amiliana Foutz  [facility]   Pager on [email or web address]. If 7PM-7AM, please contact night-coverage at [email or web address]     11/22/2022, 11:18 AM

## 2022-11-22 NOTE — ED Notes (Signed)
Pt noted to walk out to Nurses' station and state she is ready to go home.  Pt redirected back to room.  When this writer mentioned we need to hook her back up to the cardiac monitor, she stated "you need my permission for that and I don't give it."  Pt again stated that she is ready to go home.

## 2022-11-22 NOTE — Telephone Encounter (Signed)
Admitted with hypoxia and possible CHF exac.

## 2022-11-22 NOTE — ED Notes (Signed)
Hospitalist at bedside 

## 2022-11-22 NOTE — ED Notes (Signed)
Pt provided orange juice and water per request.

## 2022-11-22 NOTE — ED Notes (Signed)
Pt is now willing to stay.  Sts "I don't think I really have a choice."

## 2022-11-22 NOTE — ED Notes (Signed)
Patient is able to ambulate with assist/walker and is continent of Bowel and bladder

## 2022-11-23 DIAGNOSIS — I5033 Acute on chronic diastolic (congestive) heart failure: Secondary | ICD-10-CM | POA: Diagnosis not present

## 2022-11-23 DIAGNOSIS — J9601 Acute respiratory failure with hypoxia: Secondary | ICD-10-CM | POA: Diagnosis not present

## 2022-11-23 LAB — BASIC METABOLIC PANEL
Anion gap: 8 (ref 5–15)
BUN: 17 mg/dL (ref 8–23)
CO2: 31 mmol/L (ref 22–32)
Calcium: 7.8 mg/dL — ABNORMAL LOW (ref 8.9–10.3)
Chloride: 101 mmol/L (ref 98–111)
Creatinine, Ser: 0.55 mg/dL (ref 0.44–1.00)
GFR, Estimated: >60 mL/min (ref >60)
Glucose, Bld: 85 mg/dL (ref 70–99)
Potassium: 3.4 mmol/L — ABNORMAL LOW (ref 3.5–5.1)
Sodium: 140 mmol/L (ref 135–145)

## 2022-11-23 LAB — PHOSPHORUS: Phosphorus: 3.8 mg/dL (ref 2.5–4.6)

## 2022-11-23 LAB — CBC WITH DIFFERENTIAL/PLATELET
Abs Immature Granulocytes: 0.01 10*3/uL (ref 0.00–0.07)
Basophils Absolute: 0.1 10*3/uL (ref 0.0–0.1)
Basophils Relative: 1 %
Eosinophils Absolute: 0.5 10*3/uL (ref 0.0–0.5)
Eosinophils Relative: 8 %
HCT: 37.8 % (ref 36.0–46.0)
Hemoglobin: 11.8 g/dL — ABNORMAL LOW (ref 12.0–15.0)
Immature Granulocytes: 0 %
Lymphocytes Relative: 22 %
Lymphs Abs: 1.2 10*3/uL (ref 0.7–4.0)
MCH: 29.8 pg (ref 26.0–34.0)
MCHC: 31.2 g/dL (ref 30.0–36.0)
MCV: 95.5 fL (ref 80.0–100.0)
Monocytes Absolute: 0.7 10*3/uL (ref 0.1–1.0)
Monocytes Relative: 13 %
Neutro Abs: 3.1 10*3/uL (ref 1.7–7.7)
Neutrophils Relative %: 56 %
Platelets: 148 10*3/uL — ABNORMAL LOW (ref 150–400)
RBC: 3.96 MIL/uL (ref 3.87–5.11)
RDW: 14.8 % (ref 11.5–15.5)
WBC: 5.6 10*3/uL (ref 4.0–10.5)
nRBC: 0 % (ref 0.0–0.2)

## 2022-11-23 LAB — MAGNESIUM: Magnesium: 2.4 mg/dL (ref 1.7–2.4)

## 2022-11-23 MED ORDER — POTASSIUM CHLORIDE CRYS ER 20 MEQ PO TBCR
40.0000 meq | EXTENDED_RELEASE_TABLET | Freq: Once | ORAL | Status: AC
  Administered 2022-11-23: 40 meq via ORAL
  Filled 2022-11-23: qty 2

## 2022-11-23 MED ORDER — FUROSEMIDE 10 MG/ML IJ SOLN
20.0000 mg | Freq: Two times a day (BID) | INTRAMUSCULAR | Status: AC
  Administered 2022-11-23 – 2022-11-24 (×2): 20 mg via INTRAVENOUS
  Filled 2022-11-23 (×2): qty 2

## 2022-11-23 NOTE — Plan of Care (Signed)

## 2022-11-23 NOTE — Care Management Important Message (Signed)
Important Message  Patient Details  Name: Crystal Bass MRN: [id number] Date of Birth: 09-07-1941   Medicare Important Message Given:  N/A - LOS <3 / Initial given by admissions     Dannette Barbara 11/23/2022, 9:23 AM

## 2022-11-23 NOTE — Progress Notes (Signed)
Patient's oxygen saturation on room air were 88% at rest. When placed on 1 liter nasal cannula at rest, patient's oxygen saturation was 95%.

## 2022-11-23 NOTE — Progress Notes (Signed)
Progress Note    Crystal Bass  RWE:[id number] DOB: 1940/11/29  DOA: 11/21/2022 PCP: [name], MD      Brief Narrative:    Medical records reviewed and are as summarized below:  Crystal Bass is a 81 y.o. adult with multiple medical problems including but not limited to chronic diastolic CHF, Parkinson's disease, COPD, asthma, hypertension, anxiety, GERD, history of breast cancer, aortic aneurysm, left hip arthroplasty in September 2023, s/p mitral valve repair in March 2023, remote history of DVT.  She presented to the hospital because of shortness of breath worse with extension, lower extremity swelling and low oxygen saturation of 88% on room air.  She was admitted to the hospital for acute exacerbation of chronic diastolic CHF and acute hypoxic respiratory failure.  She was treated with IV Lasix and 2 L/min oxygen via nasal cannula.     Assessment/Plan:   Principal Problem:   Acute exacerbation of CHF (congestive heart failure) (HCC) Active Problems:   Essential hypertension   Chronic obstructive airway disease with asthma (HCC)   DNR (do not resuscitate)   Parkinson disease   Severe mitral regurgitation   S/P mitral valve clip implantation   Anxiety   Hypomagnesemia   Hypophosphatemia   Acute respiratory failure with hypoxia (HCC)   Acute exacerbation of chronic diastolic CHF: Continue IV Lasix.  Monitor BMP, daily weight and urine output.   2D echo on 08/20/2022 showed EF estimated at 60 to 61%, grade 3 diastolic dysfunction, moderate to severe MR, moderate mitral stenosis, s/p mitral valve clip.  Small to moderate right and small left pleural effusion: This is likely from CHF.  Continue IV Lasix  Acute hypoxic respiratory failure: She is on 1 L/min oxygen via nasal cannula at rest.  Taper off oxygen as able.  Check pulse oximetry with ambulation.  Hypomagnesemia and hypophosphatemia: Improved  Hypokalemia: Replete potassium and monitor  levels  Thrombocytopenia: Improving  Right lower lobe lung nodule 10 x 5 mm: Follow-up with repeat CT chest in 6 to 12 months was recommended.  Patient said that this has been "there for years".  Parkinson's disease: Continue carbidopa/levodopa   Other comorbidities include history of COPD, asthma, right hip arthroplasty in September 2023       Diet Order             Diet 2 gram sodium Fluid consistency: Thin  Diet effective now                            Consultants: None  Procedures: None    Medications:    amLODipine  2.5 mg Oral Daily   aspirin EC  81 mg Oral Daily   Carbidopa-Levodopa ER  2 tablet Oral Daily   And   Carbidopa-Levodopa ER  1 tablet Oral Q1500   And   Carbidopa-Levodopa ER  1 tablet Oral QPM   clonazePAM  0.5 mg Oral QHS   enoxaparin (LOVENOX) injection  40 mg Subcutaneous QHS   ferrous sulfate  325 mg Oral Q breakfast   fluticasone furoate-vilanterol  1 puff Inhalation Daily   And   umeclidinium bromide  1 puff Inhalation Daily   furosemide  20 mg Intravenous BID   gabapentin  600 mg Oral TID   pantoprazole  20 mg Oral Daily   Continuous Infusions:     Anti-infectives (From admission, onward)    None  Family Communication/Anticipated D/C date and plan/Code Status   DVT prophylaxis: enoxaparin (LOVENOX) injection 40 mg Start: 11/21/22 2200 Place TED hose Start: 11/21/22 1329     Code Status: DNR  Family Communication: None Disposition Plan: Plan to discharge home tomorrow   Status is: Inpatient Remains inpatient appropriate because: CHF exacerbation, hypoxia       Subjective:   Interval events noted.  She feels a little better.  She complains of shortness of breath.  Oxygen saturation dropped to 88% on room air at rest.  Objective:    Vitals:   11/23/22 0138 11/23/22 0440 11/23/22 0827 11/23/22 1201  BP: 104/70 124/83 130/88 110/67  Pulse: (!) 58 67 69 65  Resp: '17 17 20  18  '$ Temp: 97.9 F (36.6 C) 97.8 F (36.6 C) 98 F (36.7 C) 98 F (36.7 C)  TempSrc: Oral Oral    SpO2: 93% 90% 93% 91%  Weight:      Height:       No data found.   Intake/Output Summary (Last 24 hours) at 11/23/2022 1406 Last data filed at 11/23/2022 1159 Gross per 24 hour  Intake 240 ml  Output 2325 ml  Net -2085 ml   Filed Weights   11/22/22 2043  Weight: 53.6 kg    Exam:   GEN: NAD SKIN: No rash EYES: EOMI ENT: MMM CV: RRR PULM: Bibasilar rales, decreased air entry at the lung bases, no wheezing ABD: soft, ND, NT, +BS CNS: AAO x 3, non focal EXT: No edema or tenderness      Data Reviewed:   I have personally reviewed following labs and imaging studies:  Labs: Labs show the following:   Basic Metabolic Panel: Recent Labs  Lab 11/21/22 0938 11/22/22 0420 11/23/22 0431  NA 139 138 140  K 3.0* 3.6 3.4*  CL 103 101 101  CO2 '28 30 31  '$ GLUCOSE 107* 109* 85  BUN '14 15 17  '$ CREATININE 0.55 0.46 0.55  CALCIUM 8.8* 7.7* 7.8*  MG  --  1.6* 2.4  PHOS  --  2.1* 3.8   GFR Estimated Creatinine Clearance (by C-G formula based on SCr of 0.55 mg/dL) Female: 45.1 mL/min Female: 56.8 mL/min Liver Function Tests: No results for input(s): "AST", "ALT", "ALKPHOS", "BILITOT", "PROT", "ALBUMIN" in the last 168 hours. No results for input(s): "LIPASE", "AMYLASE" in the last 168 hours. No results for input(s): "AMMONIA" in the last 168 hours. Coagulation profile No results for input(s): "INR", "PROTIME" in the last 168 hours.  CBC: Recent Labs  Lab 11/21/22 0938 11/22/22 0420 11/23/22 0431  WBC 4.4 4.3 5.6  NEUTROABS  --   --  3.1  HGB 11.9* 10.6* 11.8*  HCT 38.3 34.1* 37.8  MCV 95.3 95.5 95.5  PLT 147* 140* 148*   Cardiac Enzymes: No results for input(s): "CKTOTAL", "CKMB", "CKMBINDEX", "TROPONINI" in the last 168 hours. BNP (last 3 results) Recent Labs    04/30/22 1249  PROBNP 403   CBG: No results for input(s): "GLUCAP" in the last 168  hours. D-Dimer: No results for input(s): "DDIMER" in the last 72 hours. Hgb A1c: No results for input(s): "HGBA1C" in the last 72 hours. Lipid Profile: No results for input(s): "CHOL", "HDL", "LDLCALC", "TRIG", "CHOLHDL", "LDLDIRECT" in the last 72 hours. Thyroid function studies: No results for input(s): "TSH", "T4TOTAL", "T3FREE", "THYROIDAB" in the last 72 hours.  Invalid input(s): "FREET3" Anemia work up: No results for input(s): "VITAMINB12", "FOLATE", "FERRITIN", "TIBC", "IRON", "RETICCTPCT" in the last 72 hours. Sepsis Labs:  Recent Labs  Lab 11/21/22 0938 11/22/22 0420 11/23/22 0431  PROCALCITON <0.10  --   --   WBC 4.4 4.3 5.6    Microbiology Recent Results (from the past 240 hour(s))  Resp Panel by RT-PCR (Flu A&B, Covid) Anterior Nasal Swab     Status: None   Collection Time: 11/21/22 11:19 AM   Specimen: Anterior Nasal Swab  Result Value Ref Range Status   SARS Coronavirus 2 by RT PCR NEGATIVE NEGATIVE Final    Comment: (NOTE) SARS-CoV-2 target nucleic acids are NOT DETECTED.  The SARS-CoV-2 RNA is generally detectable in upper respiratory specimens during the acute phase of infection. The lowest concentration of SARS-CoV-2 viral copies this assay can detect is 138 copies/mL. A negative result does not preclude SARS-Cov-2 infection and should not be used as the sole basis for treatment or other patient management decisions. A negative result may occur with  improper specimen collection/handling, submission of specimen other than nasopharyngeal swab, presence of viral mutation(s) within the areas targeted by this assay, and inadequate number of viral copies(<138 copies/mL). A negative result must be combined with clinical observations, patient history, and epidemiological information. The expected result is Negative.  Fact Sheet for Patients:  EntrepreneurPulse.com.au  Fact Sheet for Healthcare Providers:   IncredibleEmployment.be  This test is no t yet approved or cleared by the [place] FDA and  has been authorized for detection and/or diagnosis of SARS-CoV-2 by FDA under an Emergency Use Authorization (EUA). This EUA will remain  in effect (meaning this test can be used) for the duration of the COVID-19 declaration under Section 564(b)(1) of the Act, 21 U.S.C.section 360bbb-3(b)(1), unless the authorization is terminated  or revoked sooner.       Influenza A by PCR NEGATIVE NEGATIVE Final   Influenza B by PCR NEGATIVE NEGATIVE Final    Comment: (NOTE) The Xpert Xpress SARS-CoV-2/FLU/RSV plus assay is intended as an aid in the diagnosis of influenza from Nasopharyngeal swab specimens and should not be used as a sole basis for treatment. Nasal washings and aspirates are unacceptable for Xpert Xpress SARS-CoV-2/FLU/RSV testing.  Fact Sheet for Patients: EntrepreneurPulse.com.au  Fact Sheet for Healthcare Providers: IncredibleEmployment.be  This test is not yet approved or cleared by the [place] FDA and has been authorized for detection and/or diagnosis of SARS-CoV-2 by FDA under an Emergency Use Authorization (EUA). This EUA will remain in effect (meaning this test can be used) for the duration of the COVID-19 declaration under Section 564(b)(1) of the Act, 21 U.S.C. section 360bbb-3(b)(1), unless the authorization is terminated or revoked.  Performed at [facility], [address] [place], [place] [address]     Procedures and diagnostic studies:  No results found.             LOS: 2 days   Leona Pressly  [facility]   Pager on [email or web address]. If 7PM-7AM, please contact night-coverage at [email or web address]     11/23/2022, 2:06 PM

## 2022-11-23 NOTE — Discharge Instructions (Signed)

## 2022-11-23 NOTE — Progress Notes (Signed)
Patient admitted to the unit and a skin assessment was completed by myself and Wellsite geologist. No skin issues noted at this time.

## 2022-11-23 NOTE — Progress Notes (Signed)
Mobility Specialist - Progress Note    11/23/22 1745  Mobility  Activity Ambulated with assistance in hallway;Stood at bedside;Dangled on edge of bed  Level of Assistance Standby assist, set-up cues, supervision of patient - no hands on  Assistive Device Front wheel walker  Distance Ambulated (ft) 160 ft  Activity Response Tolerated well  Mobility Referral Yes  $Mobility charge 1 Mobility   Pt resting in bed on 1L upon entry. Pt STS and ambulates in hallway around NS with RW. Pt displayed no signs of SOB and returned to bed; needs left within reach and bed alarm activated.

## 2022-11-24 DIAGNOSIS — I5033 Acute on chronic diastolic (congestive) heart failure: Secondary | ICD-10-CM | POA: Diagnosis not present

## 2022-11-24 LAB — CBC WITH DIFFERENTIAL/PLATELET
Abs Immature Granulocytes: 0.02 10*3/uL (ref 0.00–0.07)
Basophils Absolute: 0.1 10*3/uL (ref 0.0–0.1)
Basophils Relative: 1 %
Eosinophils Absolute: 0.5 10*3/uL (ref 0.0–0.5)
Eosinophils Relative: 10 %
HCT: 39.6 % (ref 36.0–46.0)
Hemoglobin: 12.3 g/dL (ref 12.0–15.0)
Immature Granulocytes: 0 %
Lymphocytes Relative: 20 %
Lymphs Abs: 1 10*3/uL (ref 0.7–4.0)
MCH: 29.6 pg (ref 26.0–34.0)
MCHC: 31.1 g/dL (ref 30.0–36.0)
MCV: 95.4 fL (ref 80.0–100.0)
Monocytes Absolute: 0.7 10*3/uL (ref 0.1–1.0)
Monocytes Relative: 14 %
Neutro Abs: 2.8 10*3/uL (ref 1.7–7.7)
Neutrophils Relative %: 55 %
Platelets: 146 10*3/uL — ABNORMAL LOW (ref 150–400)
RBC: 4.15 MIL/uL (ref 3.87–5.11)
RDW: 14.8 % (ref 11.5–15.5)
WBC: 5.1 10*3/uL (ref 4.0–10.5)
nRBC: 0 % (ref 0.0–0.2)

## 2022-11-24 LAB — BASIC METABOLIC PANEL
Anion gap: 7 (ref 5–15)
BUN: 15 mg/dL (ref 8–23)
CO2: 31 mmol/L (ref 22–32)
Calcium: 8.2 mg/dL — ABNORMAL LOW (ref 8.9–10.3)
Chloride: 101 mmol/L (ref 98–111)
Creatinine, Ser: 0.47 mg/dL (ref 0.44–1.00)
GFR, Estimated: >60 mL/min (ref >60)
Glucose, Bld: 90 mg/dL (ref 70–99)
Potassium: 4.1 mmol/L (ref 3.5–5.1)
Sodium: 139 mmol/L (ref 135–145)

## 2022-11-24 LAB — MAGNESIUM: Magnesium: 2.2 mg/dL (ref 1.7–2.4)

## 2022-11-24 MED ORDER — FUROSEMIDE 10 MG/ML IJ SOLN
20.0000 mg | Freq: Two times a day (BID) | INTRAMUSCULAR | Status: DC
  Administered 2022-11-24: 20 mg via INTRAVENOUS

## 2022-11-24 NOTE — Plan of Care (Signed)

## 2022-11-24 NOTE — Progress Notes (Signed)
Mobility Specialist - Progress Note    11/24/22 1100  Mobility  Activity Ambulated with assistance in hallway;Stood at bedside;Dangled on edge of bed  Level of Assistance Standby assist, set-up cues, supervision of patient - no hands on  Assistive Device Front wheel walker  Distance Ambulated (ft) 160 ft  Activity Response Tolerated well  Mobility Referral Yes  $Mobility charge 1 Mobility    [employer] Mobility Specialist 11/24/22, 11:04 AM

## 2022-11-24 NOTE — Progress Notes (Signed)
Mobility Specialist - Progress Note    11/24/22 1649  Mobility  Activity Ambulated with assistance in hallway  Level of Assistance Standby assist, set-up cues, supervision of patient - no hands on  Assistive Device Front wheel walker  Distance Ambulated (ft) 160 ft  Activity Response Tolerated well  Mobility Referral Yes  $Mobility charge 1 Mobility     [employer] Mobility Specialist 11/24/22, 4:50 PM

## 2022-11-24 NOTE — Progress Notes (Signed)
Mobility Specialist - Progress Note    11/24/22 1800  Mobility  Activity Ambulated with assistance in hallway;Stood at bedside  Level of Assistance Standby assist, set-up cues, supervision of patient - no hands on  Assistive Device Front wheel walker  Distance Ambulated (ft) 160 ft  Activity Response Tolerated well  Mobility Referral Yes  $Mobility charge 1 Mobility   Pt resting in recliner on 1L upon entry. Pt STS and ambulates SBA around hallway NS. Pt returned to bed and left with needs in reach.    Loma Sender Mobility Specialist 11/24/22, 6:13 PM

## 2022-11-24 NOTE — Progress Notes (Addendum)
Progress Note    SHALICE WOODRING  [id number]:[id number] DOB: 09/16/1941  DOA: 11/21/2022 PCP: [name], MD      Brief Narrative:    Medical records reviewed and are as summarized below:  LAKYIA BEHE is a 81 y.o. adult with a PMH of Parkinson's Disease, HTN with orthostatic hypotension, COPD/Asthma, Grade 3 Diastolic Heart Failure with preserved ejection fraction, chronic exertional dyspnea due to severe mitral regurgitation s/p 07/2022 mitral clip placement x 2 with minimal improvement in symptoms, left hip fracture s/p 07/2022 hemiarthroplasty, history of breast cancer, 10/2018 diagnosis of PE which resolved after 3 months of Eliquis who presented to the ED on 11/21/22 with acute on chronic dyspnea thought to be due to Acute CHF.  CTA showed no PE and a small-moderate sized right pleural effusion.  Assessment/Plan:   Principal Problem:   Acute exacerbation of CHF (congestive heart failure) (HCC) Active Problems:   Essential hypertension   Chronic obstructive airway disease with asthma (HCC)   DNR (do not resuscitate)   Parkinson disease   Severe mitral regurgitation   S/P mitral valve clip implantation   Anxiety   Hypomagnesemia   Hypophosphatemia   Acute respiratory failure with hypoxia (HCC)  Acute Hypoxic Respiratory Failure: - Wean off of oxygen. - Perform walk test in am.  Acute on Chronic Exertional Dyspnea: MVP and recurrent symptomatic Mitral Regurgitation s/p 07/2022 mitral clip placement x 2 and concurrent Moderate Mitral Stenosis, unamenable to further clip placement: 08/20/22 Echo reports severe left atrial dilation and normal RVSP. - The recurrence of MR after mitral clip placement is associated with increased mortality and adverse LV remodeling.  Left Atrial dilation indicates poor prognosis.   - Continue gentle IV diuresis to reduce afterload and regurgitant volume. - The role of ACE/ARBs in mitral regurgitation with MVP is not recommended.  The  ACC/AHA do not have specific recommendations regarding a beta blocker like Coreg - but this can be considered to reduce symptoms of dysautonomia such as chest pain or palpitations.  Follow up with Cardiology outpatient to discuss.  Acute on Chronic Grade 3 Diastolic Heart Failure with restrictive dysfunction / Small-Moderate sized Right Pleural Effusion: - Continue gentle diuretics as above.  Electrolytes abnormalities, resolved  Essential Hypertension with occasional orthostatic hypotension secondary to mitral regurgitation and parkinsons: - Blood pressure is stable on low dose Amlodipine.  Anxiety: - Xanax nightly.  Chronic Thrombocytopenia, stable  10x5 mm RLL Nodule, chronic: - Monitor outpatient.  Parkinson's Disease: - Sinemet.  COPD/Asthma: CT shows small airway disease: - Contieu Breo-Ellipta.  07/2022 Right Hip Arthroplasty 10/2018 diagnosis of PE which resolved after 3 months of Eliquis    Diet Order             Diet 2 gram sodium Fluid consistency: Thin  Diet effective now                   Consultants: None  Procedures: None    Medications:    amLODipine  2.5 mg Oral Daily   aspirin EC  81 mg Oral Daily   Carbidopa-Levodopa ER  2 tablet Oral Daily   And   Carbidopa-Levodopa ER  1 tablet Oral Q1500   And   Carbidopa-Levodopa ER  1 tablet Oral QPM   clonazePAM  0.5 mg Oral QHS   enoxaparin (LOVENOX) injection  40 mg Subcutaneous QHS   ferrous sulfate  325 mg Oral Q breakfast   fluticasone furoate-vilanterol  1 puff Inhalation Daily  And   umeclidinium bromide  1 puff Inhalation Daily   gabapentin  600 mg Oral TID   pantoprazole  20 mg Oral Daily   Continuous Infusions:     Anti-infectives (From admission, onward)    None        Family Communication/Anticipated D/C date and plan/Code Status   DVT prophylaxis: enoxaparin (LOVENOX) injection 40 mg Start: 11/21/22 2200 Place TED hose Start: 11/21/22 1329     Code Status:  DNR  Family Communication: None Disposition Plan: Plan to discharge back to [facility] tomorrow or Monday - SW will get back to [place].  Status is: Inpatient Remains inpatient appropriate because: Acute CHF    Subjective:   Patient endorses continued fatigue and shortness of breath but symptoms have improved. She denies any chest pain   Oxygen saturation is stable on room air.  Objective:    Vitals:   11/24/22 0027 11/24/22 0442 11/24/22 0759 11/24/22 1144  BP: 115/79 111/74 121/83 (!) 97/54  Pulse: 61 66 62 62  Resp: '18 20 18 17  '$ Temp: 97.6 F (36.4 C) 97.8 F (36.6 C) 97.7 F (36.5 C) 97.7 F (36.5 C)  TempSrc:  Oral    SpO2: 98% 96% 98% 98%  Weight:      Height:       No data found.   Intake/Output Summary (Last 24 hours) at 11/24/2022 1610 Last data filed at 11/24/2022 4081 Gross per 24 hour  Intake --  Output 750 ml  Net -750 ml    Filed Weights   11/22/22 2043  Weight: 53.6 kg    Exam:  Physical Exam Constitutional:      Comments: Acute on chronically ill appearing Thin and frail 81 yo lady Slightly anxious  HENT:     Head: Normocephalic and atraumatic.     Mouth/Throat:     Mouth: Mucous membranes are moist.  Eyes:     Extraocular Movements: Extraocular movements intact.     Pupils: Pupils are equal, round, and reactive to light.  Cardiovascular:     Rate and Rhythm: Normal rate and regular rhythm.     Pulses: Normal pulses.     Heart sounds: Murmur heard.     Comments: No JVD, peripheral edema, fluid wave/ascites No widening of pulse pressure Pulmonary:     Effort: Pulmonary effort is normal.     Breath sounds: Normal breath sounds.  Abdominal:     General: There is no distension.     Palpations: Abdomen is soft.     Tenderness: There is no abdominal tenderness.     Comments: No hepatomegaly  Musculoskeletal:     Cervical back: Normal range of motion and neck supple.  Skin:    General: Skin is warm and dry.     Capillary Refill:  Capillary refill takes less than 2 seconds.  Neurological:     General: No focal deficit present.  Psychiatric:        Mood and Affect: Mood normal.       Data Reviewed:   I have personally reviewed following labs and imaging studies:  Labs: Labs show the following:   Basic Metabolic Panel: Recent Labs  Lab 11/21/22 0938 11/22/22 0420 11/23/22 0431 11/24/22 0432  NA 139 138 140 139  K 3.0* 3.6 3.4* 4.1  CL 103 101 101 101  CO2 '28 30 31 31  '$ GLUCOSE 107* 109* 85 90  BUN '14 15 17 15  '$ CREATININE 0.55 0.46 0.55 0.47  CALCIUM 8.8*  7.7* 7.8* 8.2*  MG  --  1.6* 2.4 2.2  PHOS  --  2.1* 3.8  --     GFR Estimated Creatinine Clearance (by C-G formula based on SCr of 0.47 mg/dL) Female: 45.1 mL/min Female: 56.8 mL/min   CBC: Recent Labs  Lab 11/21/22 0938 11/22/22 0420 11/23/22 0431 11/24/22 0432  WBC 4.4 4.3 5.6 5.1  NEUTROABS  --   --  3.1 2.8  HGB 11.9* 10.6* 11.8* 12.3  HCT 38.3 34.1* 37.8 39.6  MCV 95.3 95.5 95.5 95.4  PLT 147* 140* 148* 146*     BNP (last 3 results) Recent Labs    04/30/22 1249  PROBNP 403    Sepsis Labs: Recent Labs  Lab 11/21/22 0938 11/22/22 0420 11/23/22 0431 11/24/22 0432  PROCALCITON <0.10  --   --   --   WBC 4.4 4.3 5.6 5.1     Microbiology Recent Results (from the past 240 hour(s))  Resp Panel by RT-PCR (Flu A&B, Covid) Anterior Nasal Swab     Status: None   Collection Time: 11/21/22 11:19 AM   Specimen: Anterior Nasal Swab  Result Value Ref Range Status   SARS Coronavirus 2 by RT PCR NEGATIVE NEGATIVE Final    Comment: (NOTE) SARS-CoV-2 target nucleic acids are NOT DETECTED.  The SARS-CoV-2 RNA is generally detectable in upper respiratory specimens during the acute phase of infection. The lowest concentration of SARS-CoV-2 viral copies this assay can detect is 138 copies/mL. A negative result does not preclude SARS-Cov-2 infection and should not be used as the sole basis for treatment or other patient  management decisions. A negative result may occur with  improper specimen collection/handling, submission of specimen other than nasopharyngeal swab, presence of viral mutation(s) within the areas targeted by this assay, and inadequate number of viral copies(<138 copies/mL). A negative result must be combined with clinical observations, patient history, and epidemiological information. The expected result is Negative.  Fact Sheet for Patients:  EntrepreneurPulse.com.au  Fact Sheet for Healthcare Providers:  IncredibleEmployment.be  This test is no t yet approved or cleared by the [place] FDA and  has been authorized for detection and/or diagnosis of SARS-CoV-2 by FDA under an Emergency Use Authorization (EUA). This EUA will remain  in effect (meaning this test can be used) for the duration of the COVID-19 declaration under Section 564(b)(1) of the Act, 21 U.S.C.section 360bbb-3(b)(1), unless the authorization is terminated  or revoked sooner.       Influenza A by PCR NEGATIVE NEGATIVE Final   Influenza B by PCR NEGATIVE NEGATIVE Final    Comment: (NOTE) The Xpert Xpress SARS-CoV-2/FLU/RSV plus assay is intended as an aid in the diagnosis of influenza from Nasopharyngeal swab specimens and should not be used as a sole basis for treatment. Nasal washings and aspirates are unacceptable for Xpert Xpress SARS-CoV-2/FLU/RSV testing.  Fact Sheet for Patients: EntrepreneurPulse.com.au  Fact Sheet for Healthcare Providers: IncredibleEmployment.be  This test is not yet approved or cleared by the [place] FDA and has been authorized for detection and/or diagnosis of SARS-CoV-2 by FDA under an Emergency Use Authorization (EUA). This EUA will remain in effect (meaning this test can be used) for the duration of the COVID-19 declaration under Section 564(b)(1) of the Act, 21 U.S.C. section 360bbb-3(b)(1),  unless the authorization is terminated or revoked.  Performed at [facility], [address] [place], [place] [address]     Procedures and diagnostic studies:  No results found.     LOS:  3 days   George Hugh  [facility]   Pager on [email or web address]. If 7PM-7AM, please contact night-coverage at [email or web address]     11/24/2022, 4:10 PM

## 2022-11-25 DIAGNOSIS — E8770 Fluid overload, unspecified: Secondary | ICD-10-CM

## 2022-11-25 DIAGNOSIS — R0902 Hypoxemia: Secondary | ICD-10-CM

## 2022-11-25 LAB — BASIC METABOLIC PANEL
Anion gap: 8 (ref 5–15)
BUN: 11 mg/dL (ref 8–23)
CO2: 30 mmol/L (ref 22–32)
Calcium: 8.3 mg/dL — ABNORMAL LOW (ref 8.9–10.3)
Chloride: 101 mmol/L (ref 98–111)
Creatinine, Ser: 0.57 mg/dL (ref 0.44–1.00)
GFR, Estimated: >60 mL/min (ref >60)
Glucose, Bld: 94 mg/dL (ref 70–99)
Potassium: 3.9 mmol/L (ref 3.5–5.1)
Sodium: 139 mmol/L (ref 135–145)

## 2022-11-25 LAB — BRAIN NATRIURETIC PEPTIDE: B Natriuretic Peptide: 247.6 pg/mL — ABNORMAL HIGH (ref 0.0–100.0)

## 2022-11-25 LAB — CBC
HCT: 39.3 % (ref 36.0–46.0)
Hemoglobin: 12.3 g/dL (ref 12.0–15.0)
MCH: 29.9 pg (ref 26.0–34.0)
MCHC: 31.3 g/dL (ref 30.0–36.0)
MCV: 95.6 fL (ref 80.0–100.0)
Platelets: 152 10*3/uL (ref 150–400)
RBC: 4.11 MIL/uL (ref 3.87–5.11)
RDW: 14.8 % (ref 11.5–15.5)
WBC: 5.1 10*3/uL (ref 4.0–10.5)
nRBC: 0 % (ref 0.0–0.2)

## 2022-11-25 LAB — MAGNESIUM: Magnesium: 2.4 mg/dL (ref 1.7–2.4)

## 2022-11-25 LAB — PHOSPHORUS: Phosphorus: 3 mg/dL (ref 2.5–4.6)

## 2022-11-25 MED ORDER — FUROSEMIDE 20 MG PO TABS
20.0000 mg | ORAL_TABLET | Freq: Two times a day (BID) | ORAL | Status: DC
  Administered 2022-11-25 – 2022-11-26 (×3): 20 mg via ORAL
  Filled 2022-11-25 (×3): qty 1

## 2022-11-25 NOTE — Progress Notes (Deleted)
Mobility Specialist - Progress Note    11/25/22 1047  Mobility  Activity Ambulated with assistance in hallway;Stood at bedside  Level of Assistance Modified independent, requires aide device or extra time  Assistive Device Front wheel walker  Distance Ambulated (ft) 160 ft  Activity Response Tolerated well  Mobility Referral Yes  $Mobility charge 1 Mobility     [place] Specialist 11/25/22, 1:26 PM

## 2022-11-25 NOTE — Progress Notes (Signed)
Mobility Specialist - Progress Note     11/25/22 1500  Mobility  Activity Ambulated with assistance in hallway;Stood at bedside  Level of Assistance Modified independent, requires aide device or extra time  Assistive Device Front wheel walker  Distance Ambulated (ft) 160 ft  Activity Response Tolerated well  Mobility Referral Yes  $Mobility charge 1 Mobility   Pt resting in bed on RA upon entry. Pt STS and ambulates around NS in hallway for 1 lap with RW ModI. Pt return to bed and left with needs in reach.   Loma Sender Mobility Specialist 11/25/22, 3:26 PM

## 2022-11-25 NOTE — Progress Notes (Signed)
Progress Note    Crystal Bass  [id number]:[id number] DOB: 24-Sep-1941  DOA: 11/21/2022 PCP: [name], MD    Brief Narrative:    Medical records reviewed and are as summarized below:  Crystal Bass is a 81 y.o. adult with a PMH of Parkinson's Disease, HTN with orthostatic hypotension, COPD/Asthma, Grade 3 Diastolic Heart Failure with preserved ejection fraction, chronic exertional dyspnea due to severe mitral regurgitation s/p 07/2022 mitral clip placement x 2 with minimal improvement in symptoms, left hip fracture s/p 07/2022 hemiarthroplasty, history of breast cancer, 10/2018 diagnosis of PE which resolved after 3 months of Eliquis who presented to the ED on 11/21/22 with acute on chronic dyspnea thought to be due to Acute CHF.  CTA showed no PE and a small-moderate sized right pleural effusion.  Assessment/Plan:   Principal Problem:   Acute exacerbation of CHF (congestive heart failure) (HCC) Active Problems:   Essential hypertension   Chronic obstructive airway disease with asthma (HCC)   DNR (do not resuscitate)   Parkinson disease   Severe mitral regurgitation   S/P mitral valve clip implantation   Anxiety   Hypomagnesemia   Hypophosphatemia   Acute respiratory failure with hypoxia (HCC)  Acute Hypoxic Respiratory Failure: - Wean off of oxygen. - Perform walk test in am.  Acute on Chronic Exertional Dyspnea /  Acute on Chronic Grade 3 Diastolic Heart Failure with restrictive dysfunction / Small-Moderate sized Right Pleural Effusion:    -Patient symptomatically feels better despite the lower extremity edema.  Still has crackles at bases.  Patient's Lasix switched from IV to PO Lasix 20 mg BID - The role of ACE/ARBs in mitral regurgitation with MVP is not recommended.  The ACC/AHA do not have specific recommendations regarding a beta blocker like Coreg - but this can be considered to reduce symptoms of dysautonomia such as chest pain or palpitations.   -Follow up  with Cardiology outpatient to discuss.  Electrolytes abnormalities, resolved  Essential Hypertension with occasional orthostatic hypotension secondary to mitral regurgitation and parkinsons: - Blood pressure is stable on low dose Amlodipine.  Anxiety: - Xanax nightly.  Chronic Thrombocytopenia, stable  10x5 mm RLL Nodule, chronic: - Monitor outpatient.  Parkinson's Disease: - Sinemet.  COPD/Asthma: CT shows small airway disease: - Contieu Breo-Ellipta.  07/2022 Right Hip Arthroplasty 10/2018 diagnosis of PE which resolved after 3 months of Eliquis    Diet Order             Diet 2 gram sodium Fluid consistency: Thin  Diet effective now                   Consultants: None  Procedures: None    Medications:    amLODipine  2.5 mg Oral Daily   aspirin EC  81 mg Oral Daily   Carbidopa-Levodopa ER  2 tablet Oral Daily   And   Carbidopa-Levodopa ER  1 tablet Oral Q1500   And   Carbidopa-Levodopa ER  1 tablet Oral QPM   clonazePAM  0.5 mg Oral QHS   enoxaparin (LOVENOX) injection  40 mg Subcutaneous QHS   ferrous sulfate  325 mg Oral Q breakfast   fluticasone furoate-vilanterol  1 puff Inhalation Daily   And   umeclidinium bromide  1 puff Inhalation Daily   furosemide  20 mg Oral BID   gabapentin  600 mg Oral TID   pantoprazole  20 mg Oral Daily   Continuous Infusions:     Anti-infectives (From admission, onward)  None        Family Communication/Anticipated D/C date and plan/Code Status   DVT prophylaxis: enoxaparin (LOVENOX) injection 40 mg Start: 11/21/22 2200 Place TED hose Start: 11/21/22 1329     Code Status: DNR  Family Communication: None Disposition Plan: Plan to discharge back to [facility] tomorrow or Monday - SW will get back to [place].  Status is: Inpatient Remains inpatient appropriate because: Acute CHF    Subjective:   Pt seen and examined this morning, No overnight complain. Pt feels better. Exam with basal crackles.   She denies any chest pain   Oxygen saturation is stable on room air. Plan to d/c tomorrow.   Objective:    Vitals:   11/24/22 2344 11/25/22 0324 11/25/22 0818 11/25/22 1202  BP: 105/67 124/76 123/75 103/61  Pulse: 61 63 66 64  Resp: 20 16 (!) 22 20  Temp: (!) 97.5 F (36.4 C) 98.7 F (37.1 C) 98.4 F (36.9 C) 98.4 F (36.9 C)  TempSrc:   Oral Oral  SpO2: 94% 99% 99% 94%  Weight:      Height:       No data found.   Intake/Output Summary (Last 24 hours) at 11/25/2022 1256 Last data filed at 11/25/2022 0300 Gross per 24 hour  Intake 480 ml  Output 0 ml  Net 480 ml    Filed Weights   11/22/22 2043  Weight: 53.6 kg    Exam:  Physical Exam Constitutional:      Comments: Acute on chronically ill appearing Thin and frail 81 yo lady Slightly anxious  HENT:     Head: Normocephalic and atraumatic.     Mouth/Throat:     Mouth: Mucous membranes are moist.  Eyes:     Extraocular Movements: Extraocular movements intact.     Pupils: Pupils are equal, round, and reactive to light.  Cardiovascular:     Rate and Rhythm: Normal rate and regular rhythm.     Pulses: Normal pulses.     Heart sounds: Murmur heard.     Comments: No JVD, peripheral edema, fluid wave/ascites No widening of pulse pressure Pulmonary:     Effort: Pulmonary effort is normal.     Breath sounds: Rales present.  Abdominal:     General: There is no distension.     Palpations: Abdomen is soft.     Tenderness: There is no abdominal tenderness.     Comments: No hepatomegaly  Musculoskeletal:     Cervical back: Normal range of motion and neck supple.  Skin:    General: Skin is warm and dry.     Capillary Refill: Capillary refill takes less than 2 seconds.  Neurological:     General: No focal deficit present.  Psychiatric:        Mood and Affect: Mood normal.       Data Reviewed:   I have personally reviewed following labs and imaging studies:  Labs: Labs show the following:   Basic  Metabolic Panel: Recent Labs  Lab 11/21/22 0938 11/22/22 0420 11/23/22 0431 11/24/22 0432 11/25/22 0458  NA 139 138 140 139 139  K 3.0* 3.6 3.4* 4.1 3.9  CL 103 101 101 101 101  CO2 '28 30 31 31 30  '$ GLUCOSE 107* 109* 85 90 94  BUN '14 15 17 15 11  '$ CREATININE 0.55 0.46 0.55 0.47 0.57  CALCIUM 8.8* 7.7* 7.8* 8.2* 8.3*  MG  --  1.6* 2.4 2.2 2.4  PHOS  --  2.1* 3.8  --  3.0    GFR Estimated Creatinine Clearance (by C-G formula based on SCr of 0.57 mg/dL) Female: 45.1 mL/min Female: 56.8 mL/min   CBC: Recent Labs  Lab 11/21/22 0938 11/22/22 0420 11/23/22 0431 11/24/22 0432 11/25/22 0458  WBC 4.4 4.3 5.6 5.1 5.1  NEUTROABS  --   --  3.1 2.8  --   HGB 11.9* 10.6* 11.8* 12.3 12.3  HCT 38.3 34.1* 37.8 39.6 39.3  MCV 95.3 95.5 95.5 95.4 95.6  PLT 147* 140* 148* 146* 152     BNP (last 3 results) Recent Labs    04/30/22 1249  PROBNP 403    Sepsis Labs: Recent Labs  Lab 11/21/22 0938 11/22/22 0420 11/23/22 0431 11/24/22 0432 11/25/22 0458  PROCALCITON <0.10  --   --   --   --   WBC 4.4 4.3 5.6 5.1 5.1     Microbiology Recent Results (from the past 240 hour(s))  Resp Panel by RT-PCR (Flu A&B, Covid) Anterior Nasal Swab     Status: None   Collection Time: 11/21/22 11:19 AM   Specimen: Anterior Nasal Swab  Result Value Ref Range Status   SARS Coronavirus 2 by RT PCR NEGATIVE NEGATIVE Final    Comment: (NOTE) SARS-CoV-2 target nucleic acids are NOT DETECTED.  The SARS-CoV-2 RNA is generally detectable in upper respiratory specimens during the acute phase of infection. The lowest concentration of SARS-CoV-2 viral copies this assay can detect is 138 copies/mL. A negative result does not preclude SARS-Cov-2 infection and should not be used as the sole basis for treatment or other patient management decisions. A negative result may occur with  improper specimen collection/handling, submission of specimen other than nasopharyngeal swab, presence of viral  mutation(s) within the areas targeted by this assay, and inadequate number of viral copies(<138 copies/mL). A negative result must be combined with clinical observations, patient history, and epidemiological information. The expected result is Negative.  Fact Sheet for Patients:  EntrepreneurPulse.com.au  Fact Sheet for Healthcare Providers:  IncredibleEmployment.be  This test is no t yet approved or cleared by the [place] FDA and  has been authorized for detection and/or diagnosis of SARS-CoV-2 by FDA under an Emergency Use Authorization (EUA). This EUA will remain  in effect (meaning this test can be used) for the duration of the COVID-19 declaration under Section 564(b)(1) of the Act, 21 U.S.C.section 360bbb-3(b)(1), unless the authorization is terminated  or revoked sooner.       Influenza A by PCR NEGATIVE NEGATIVE Final   Influenza B by PCR NEGATIVE NEGATIVE Final    Comment: (NOTE) The Xpert Xpress SARS-CoV-2/FLU/RSV plus assay is intended as an aid in the diagnosis of influenza from Nasopharyngeal swab specimens and should not be used as a sole basis for treatment. Nasal washings and aspirates are unacceptable for Xpert Xpress SARS-CoV-2/FLU/RSV testing.  Fact Sheet for Patients: EntrepreneurPulse.com.au  Fact Sheet for Healthcare Providers: IncredibleEmployment.be  This test is not yet approved or cleared by the [place] FDA and has been authorized for detection and/or diagnosis of SARS-CoV-2 by FDA under an Emergency Use Authorization (EUA). This EUA will remain in effect (meaning this test can be used) for the duration of the COVID-19 declaration under Section 564(b)(1) of the Act, 21 U.S.C. section 360bbb-3(b)(1), unless the authorization is terminated or revoked.  Performed at [facility], [address] [place], [place] [address]     Procedures and diagnostic  studies:  No results found.     LOS: 4 days  Oran Rein  [facility]   Pager on [email or web address]. If 7PM-7AM, please contact night-coverage at [email or web address]     11/25/2022, 12:56 PM

## 2022-11-25 NOTE — Progress Notes (Signed)
Mobility Specialist - Progress Note    11/25/22 1047  Mobility  Activity Ambulated with assistance in hallway;Stood at bedside  Level of Assistance Modified independent, requires aide device or extra time  Assistive Device Front wheel walker  Distance Ambulated (ft) 160 ft  Activity Response Tolerated well  Mobility Referral Yes  $Mobility charge 1 Mobility   Pt resting in bed on RA upon entry. Pt STS and ambulates in hallway around [place]. Pt return to bed and left with needs in reach.   Loma Sender Mobility Specialist 11/25/22, 10:49 AM

## 2022-11-25 NOTE — Progress Notes (Signed)
Mobility Specialist - Progress Note    11/25/22 1526  Mobility  Activity Stood at bedside;Dangled on edge of bed;Ambulated with assistance in room  Level of Assistance Modified independent, requires aide device or extra time  Assistive Device Front wheel walker  Distance Ambulated (ft) 4 ft  Range of Motion/Exercises Left leg;Right leg;Left arm;Right arm  Activity Response Tolerated well  Mobility Referral Yes  $Mobility charge 1 Mobility   Pt resting EOB on RA upon entry. Research scientist (medical) basketball with paper ball and trash can with pt sitting EOB and STS x1 with RW to retrieve ball. Pt performed UE exercise by shooting ball x15. Pt returned to bed and left with needs in reach.    Loma Sender Mobility Specialist 11/25/22, 3:36 PM

## 2022-11-26 DIAGNOSIS — I5033 Acute on chronic diastolic (congestive) heart failure: Secondary | ICD-10-CM | POA: Diagnosis not present

## 2022-11-26 MED ORDER — FUROSEMIDE 20 MG PO TABS: 20.0000 mg | ORAL_TABLET | Freq: Two times a day (BID) | ORAL | 0 refills | Status: DC

## 2022-11-26 NOTE — Discharge Summary (Signed)
Physician Discharge Summary   Patient: Crystal Bass MRN: [id number] DOB: 12-24-1940  Admit date:     11/21/2022  Discharge date: 11/26/22  Discharge Physician: [place]   PCP: [name], MD   Recommendations at discharge:   Patient and her brother were instructed to follow-up with PCP within 2 weeks.  Discharge Diagnoses: Principal Problem:   Acute exacerbation of CHF (congestive heart failure) (HCC) Active Problems:   Essential hypertension   Chronic obstructive airway disease with asthma ([place])   DNR (do not resuscitate)   Parkinson disease   Severe mitral regurgitation   S/P mitral valve clip implantation   Anxiety   Hypomagnesemia   Hypophosphatemia   Acute respiratory failure with hypoxia [facility])  Hospital Course: This is a pleasant 82 year old female with a history of Parkinson's disease, hypertension with orthostatic hypotension, COPD/asthma on room air, grade 3 diastolic heart failure with preserved EF as well as exertional dyspnea due to known severe mitral regurgitation status post 9/23 mitral clip placement x 2 with minimal improvement, 12/19 diagnosis of PE on Eliquis admitted to the hospital with acute on chronic dyspnea thought to be due to acute CHF.  CTA was done at the time of admission, showed no PE and a small to moderate size right pleural effusion.  Patient was admitted to the hospitalist service, she was placed on supplemental oxygen, she was started on IV Lasix and has improved with diuretic.  Today she looks euvolemic, she has been weaned to oxygen, has been ambulating in her room with minimal assistance and is at her baseline.  Discussed with the patient this morning, as well as over the phone with her brother.  Discussed that she is stable for discharge back home, patient and her brother are agreeable with this.  She will be discharged back home to her independent living today, with close outpatient PCP follow-up.  Discussed with her  brother that she is going to be on a higher dose of Lasix, needs close follow-up regarding her renal function and volume status.  He understands and is agreeable to discharge home from the hospital today.  Consultants: None Procedures performed: None  Disposition: Home Diet recommendation:  Discharge Diet Orders (From admission, onward)     Start     Ordered   11/26/22 0000  Diet - low sodium heart healthy        11/26/22 1235           Cardiac diet DISCHARGE MEDICATION: Allergies as of 11/26/2022       Reactions   Sulfa Antibiotics Anaphylaxis   Sulfonamide Derivatives Anaphylaxis   Topamax [topiramate] Other (See Comments)   Metabolic acidosis    Clarithromycin Other (See Comments)   pericarditis Other reaction(s): Not available   Hydrocodone Other (See Comments)   "hyper and climbing the walls"   Motrin [ibuprofen] Other (See Comments)   headaches   Misc. Sulfonamide Containing Compounds    Other reaction(s): Not available   Symbicort [budesonide-formoterol Fumarate] Other (See Comments)   02/07/15 tremor   Tetracycline Hcl Other (See Comments)   Percocet [oxycodone-acetaminophen] Itching, Rash   Tape Itching, Rash   Use paper tape only   Tetracyclines & Related Other (See Comments)   "immediate yeast infection"        Medication List     STOP taking these medications    cephALEXin 500 MG capsule Commonly known as: KEFLEX   enoxaparin 40 MG/0.4ML injection Commonly known as: LOVENOX  TAKE these medications    acetaminophen 500 MG tablet Commonly known as: TYLENOL Take 1,000 mg by mouth every 12 (twelve) hours as needed.   albuterol 108 (90 Base) MCG/ACT inhaler Commonly known as: VENTOLIN HFA Inhale 2 puffs into the lungs every 6 (six) hours as needed for wheezing or shortness of breath.   amLODipine 2.5 MG tablet Commonly known as: NORVASC Take 1 tablet (2.5 mg total) by mouth daily.   aspirin 81 MG tablet Take 1 tablet (81 mg total)  by mouth daily.   carbidopa-levodopa 25-100 MG tablet Commonly known as: [name] Take 1 tablet by mouth 2 (two) times daily. See other 3 listings all reflected from [facility]   Carbidopa-Levodopa ER 25-100 MG tablet controlled release Commonly known as: SINEMET CR Take 1-2 tablets by mouth 3 (three) times daily.   clonazePAM 0.5 MG tablet Commonly known as: KLONOPIN Take 0.5 mg by mouth at bedtime.   EPINEPHrine 0.3 mg/0.3 mL Soaj injection Commonly known as: EPI-PEN Inject 0.3 mg into the muscle as needed for anaphylaxis.   ferrous sulfate 325 (65 FE) MG tablet Take 1 tablet (325 mg total) by mouth daily with breakfast.   fexofenadine 180 MG tablet Commonly known as: ALLEGRA Take 180 mg by mouth daily.   furosemide 20 MG tablet Commonly known as: LASIX Take 1 tablet (20 mg total) by mouth 2 (two) times daily for 14 days. What changed: when to take this   gabapentin 300 MG capsule Commonly known as: [name] TAKE 2 CAPSULES BY MOUTH 3 TIMES DAILY What changed:  how much to take how to take this when to take this   pantoprazole 20 MG tablet Commonly known as: PROTONIX Take 20 mg by mouth daily.   potassium chloride SA 20 MEQ tablet Commonly known as: KLOR-CON M Take 1 tablet (20 mEq total) by mouth daily.   senna 8.6 MG Tabs tablet Commonly known as: SENOKOT Take 1 tablet by mouth 2 (two) times daily.   Trelegy Ellipta 100-62.5-25 MCG/ACT Aepb Generic drug: Fluticasone-Umeclidin-Vilant Inhale 1 puff into the lungs daily.   Vitamin D3 25 MCG (1000 UT) Caps Take 1 capsule (1,000 Units total) by mouth daily.        Follow-up Information     [name], MD Follow up in 1 week(s).   Specialty: Family Medicine Contact information: [place] [address] [address] [phone]                Discharge Exam: Danley Danker Weights   11/22/22 2043  Weight: 53.6 kg  Constitutional:      Comments: Elderly  female appearing slightly older than her stated age, resting comfortably sitting up at the edge of the bed having breakfast.  She looks comfortable, not acutely ill. HENT:     Head: Normocephalic and atraumatic.     Mouth/Throat:     Mouth: Mucous membranes are moist.  Eyes:     Extraocular Movements: Extraocular movements intact.     Pupils: Pupils are equal, round, and reactive to light.  Cardiovascular:     Rate and Rhythm: Normal rate and regular rhythm.     Pulses: Normal pulses.     Heart sounds: Murmur heard.     Comments: No JVD, peripheral edema, fluid wave/ascites No widening of pulse pressure Pulmonary:     Effort: Pulmonary effort is normal.     Breath sounds: Clear to auscultation, except for fine bilateral basilar crackles.  No  wheezing, or rhonchi. Abdominal:     General: There is no distension.     Palpations: Abdomen is soft.     Tenderness: There is no abdominal tenderness.     Comments: No hepatomegaly  Musculoskeletal:     Cervical back: Normal range of motion and neck supple.  Skin:    General: Skin is warm and dry.     Capillary Refill: Capillary refill takes less than 2 seconds.  Neurological:     General: No focal deficit present.  Psychiatric:        Mood and Affect: Mood normal.    Condition at discharge: good  The results of significant diagnostics from this hospitalization (including imaging, microbiology, ancillary and laboratory) are listed below for reference.   Imaging Studies: CT Angio Chest PE W and/or Wo Contrast  Result Date: 11/21/2022 CLINICAL DATA:  Dyspnea. EXAM: CT ANGIOGRAPHY CHEST WITH CONTRAST TECHNIQUE: Multidetector CT imaging of the chest was performed using the standard protocol during bolus administration of intravenous contrast. Multiplanar CT image reconstructions and MIPs were obtained to evaluate the vascular anatomy. RADIATION DOSE REDUCTION: This exam was performed according to the departmental dose-optimization program  which includes automated exposure control, adjustment of the mA and/or kV according to patient size and/or use of iterative reconstruction technique. CONTRAST:  53m OMNIPAQUE IOHEXOL 350 MG/ML SOLN COMPARISON:  February 03, 2022. FINDINGS: Cardiovascular: Satisfactory opacification of the pulmonary arteries to the segmental level. No evidence of pulmonary embolism. Moderate cardiomegaly is noted with significant left atrial enlargement. No pericardial effusion. Mediastinum/Nodes: No enlarged mediastinal, hilar, or axillary lymph nodes. Thyroid gland, trachea, and esophagus demonstrate no significant findings. Lungs/Pleura: No pneumothorax is noted. Stable right upper lobe opacity is noted most consistent with postoperative change chronic scarring. Small to moderate size right pleural effusion is noted. Small left pleural effusion is noted. Mosaic pattern is noted throughout both lungs which may represent multifocal inflammation or air trapping secondary to small airways disease. Bibasilar subsegmental atelectasis is noted. 10 x 5 mm irregular density is noted in the right lower lobe best seen on image number 57 of series 5 which is not changed compared to prior exam. Upper Abdomen: Small nonobstructive right renal calculus. Musculoskeletal: No chest wall abnormality. No acute or significant osseous findings. Review of the MIP images confirms the above findings. IMPRESSION: No definite evidence of pulmonary embolus. 10 x 5 mm irregular density is noted in right lower lobe which is not significantly changed compared to prior exam. Neoplasm cannot be excluded. Follow-up unenhanced chest CT in 6-12 months is recommended. Small to moderate size right pleural effusion is noted. Small left pleural effusion is noted. Mild bibasilar subsegmental atelectasis is noted. Mosaic pattern is noted throughout both lungs which may represent multifocal inflammation or air trapping secondary to small airways disease. Moderate  cardiomegaly is noted with significant left atrial enlargement. Small nonobstructive right renal calculus. Aortic Atherosclerosis (ICD10-I70.0). Electronically Signed   By: JMarijo ConceptionM.D.   On: 11/21/2022 10:55   DG Chest 2 View  Result Date: 11/21/2022 CLINICAL DATA:  Shortness of breath. EXAM: CHEST - 2 VIEW COMPARISON:  August 24, 2022. FINDINGS: Stable cardiomegaly with probable central pulmonary vascular congestion. Increased bilateral lung opacities are noted most consistent with pulmonary edema or possibly pneumonia. Left basilar atelectasis with possible effusion is noted. Bony thorax is unremarkable. IMPRESSION: Stable cardiomegaly with probable central pulmonary vascular congestion. Increased bilateral lung opacities are noted most consistent pulmonary edema or possibly pneumonia. Electronically Signed  By: Marijo Conception M.D.   On: 11/21/2022 10:10    Microbiology: Results for orders placed or performed during the hospital encounter of 11/21/22  Resp Panel by RT-PCR (Flu A&B, Covid) Anterior Nasal Swab     Status: None   Collection Time: 11/21/22 11:19 AM   Specimen: Anterior Nasal Swab  Result Value Ref Range Status   SARS Coronavirus 2 by RT PCR NEGATIVE NEGATIVE Final    Comment: (NOTE) SARS-CoV-2 target nucleic acids are NOT DETECTED.  The SARS-CoV-2 RNA is generally detectable in upper respiratory specimens during the acute phase of infection. The lowest concentration of SARS-CoV-2 viral copies this assay can detect is 138 copies/mL. A negative result does not preclude SARS-Cov-2 infection and should not be used as the sole basis for treatment or other patient management decisions. A negative result may occur with  improper specimen collection/handling, submission of specimen other than nasopharyngeal swab, presence of viral mutation(s) within the areas targeted by this assay, and inadequate number of viral copies(<138 copies/mL). A negative result must be  combined with clinical observations, patient history, and epidemiological information. The expected result is Negative.  Fact Sheet for Patients:  EntrepreneurPulse.com.au  Fact Sheet for Healthcare Providers:  IncredibleEmployment.be  This test is no t yet approved or cleared by the [place] FDA and  has been authorized for detection and/or diagnosis of SARS-CoV-2 by FDA under an Emergency Use Authorization (EUA). This EUA will remain  in effect (meaning this test can be used) for the duration of the COVID-19 declaration under Section 564(b)(1) of the Act, 21 U.S.C.section 360bbb-3(b)(1), unless the authorization is terminated  or revoked sooner.       Influenza A by PCR NEGATIVE NEGATIVE Final   Influenza B by PCR NEGATIVE NEGATIVE Final    Comment: (NOTE) The Xpert Xpress SARS-CoV-2/FLU/RSV plus assay is intended as an aid in the diagnosis of influenza from Nasopharyngeal swab specimens and should not be used as a sole basis for treatment. Nasal washings and aspirates are unacceptable for Xpert Xpress SARS-CoV-2/FLU/RSV testing.  Fact Sheet for Patients: EntrepreneurPulse.com.au  Fact Sheet for Healthcare Providers: IncredibleEmployment.be  This test is not yet approved or cleared by the [place] FDA and has been authorized for detection and/or diagnosis of SARS-CoV-2 by FDA under an Emergency Use Authorization (EUA). This EUA will remain in effect (meaning this test can be used) for the duration of the COVID-19 declaration under Section 564(b)(1) of the Act, 21 U.S.C. section 360bbb-3(b)(1), unless the authorization is terminated or revoked.  Performed at [facility], [address] [place], [place] [address]    *Note: Due to a large number of results and/or encounters for the requested time period, some results have not been displayed. A complete set of results can be found  in Results Review.    Labs: CBC: Recent Labs  Lab 11/21/22 0938 11/22/22 0420 11/23/22 0431 11/24/22 0432 11/25/22 0458  WBC 4.4 4.3 5.6 5.1 5.1  NEUTROABS  --   --  3.1 2.8  --   HGB 11.9* 10.6* 11.8* 12.3 12.3  HCT 38.3 34.1* 37.8 39.6 39.3  MCV 95.3 95.5 95.5 95.4 95.6  PLT 147* 140* 148* 146* 254   Basic Metabolic Panel: Recent Labs  Lab 11/21/22 0938 11/22/22 0420 11/23/22 0431 11/24/22 0432 11/25/22 0458  NA 139 138 140 139 139  K 3.0* 3.6 3.4* 4.1 3.9  CL 103 101 101 101 101  CO2 '28 30 31 31 30  '$ GLUCOSE 107* 109*  85 90 94  BUN '14 15 17 15 11  '$ CREATININE 0.55 0.46 0.55 0.47 0.57  CALCIUM 8.8* 7.7* 7.8* 8.2* 8.3*  MG  --  1.6* 2.4 2.2 2.4  PHOS  --  2.1* 3.8  --  3.0   Liver Function Tests: No results for input(s): "AST", "ALT", "ALKPHOS", "BILITOT", "PROT", "ALBUMIN" in the last 168 hours. CBG: No results for input(s): "GLUCAP" in the last 168 hours.  Discharge time spent: greater than 30 minutes.  Signed: Graham Hyun Marry Guan, MD [facility] 11/26/2022

## 2022-11-26 NOTE — Care Management Important Message (Signed)
Important Message  Patient Details  Name: [name] MRN: [id number] Date of Birth: [birth year]   Medicare Important Message Given:  Yes  Reviewed Medicare IM with patient via room phone [phone]).  Copy of Medicare IM placed in mail to home address on file.    Dannette Barbara 11/26/2022, 12:41 PM

## 2022-11-26 NOTE — Progress Notes (Signed)
Patient from [facility] which assists with transportation

## 2022-11-26 NOTE — Progress Notes (Signed)
Patient is from [facility], per MD patient is back to baseline. Will be discharged back to [place] today.   Please consult TOC should discharge needs arise.   Kelby Fam, [place], MSW, [place]

## 2022-11-26 NOTE — Care Management Important Message (Signed)
Important Message  Patient Details  Name: Crystal Bass MRN: [id number] Date of Birth: 1941-08-08   Medicare Important Message Given:  Other (see comment)  Attempted to review Medicare IM with patient via room phone [phone]), unable to reach at this time.    Dannette Barbara 11/26/2022, 9:48 AM

## 2022-11-27 NOTE — Progress Notes (Signed)
Discussed discharge instruction with patient including medications and follow appointments.  AVS information sent home with patient.

## 2022-11-28 ENCOUNTER — Telehealth: Payer: Self-pay | Admitting: *Deleted

## 2022-11-28 NOTE — Progress Notes (Signed)
  Care Coordination  Note  11/28/2022 Name: Crystal Bass MRN: [id number] DOB: November 19, 1941  Crystal Bass is a 82 y.o. year old primary care patient of [name], MD. I reached out to Patsey Berthold by phone today to assist with scheduling a follow up appointment. [facility] verbally consented to my assistance.       Follow up plan: Hospital Follow Up appointment scheduled with (Dr [name]) on (12/16/2022) at (1130am).  Julian Hy, [place] Direct Dial: [phone]

## 2022-11-28 NOTE — Patient Outreach (Signed)
  Care Coordination TOC Note Transition Care Management Follow-up Telephone Call Date of discharge and from where: [phone]  [facility] How have you been since you were released from the hospital? I am feeling pretty good Any questions or concerns? No  Items Reviewed: Did the pt receive and understand the discharge instructions provided? Yes  Medications obtained and verified? Yes  Other? n Any new allergies since your discharge? No  Dietary orders reviewed? Yes Low sodium heart healthy diet Do you have support at home? Yes   Home Care and Equipment/Supplies: Were home health services ordered? not applicable If so, what is the name of the agency? N/a   Has the agency set up a time to come to the patient's home? not applicable Were any new equipment or medical supplies ordered?  No What is the name of the medical supply agency? N/a Were you able to get the supplies/equipment? not applicable Do you have any questions related to the use of the equipment or supplies? No  Functional Questionnaire: (I = Independent and D = Dependent) ADLs: I  Bathing/Dressing- I  Meal Prep- I  Eating- I  Maintaining continence- I  Transferring/Ambulation- I  Managing Meds- I  Follow up appointments reviewed:  PCP Hospital f/u appt confirmed? No  . [facility] f/u appt confirmed? Doristine Devoid [phone] 4:00. Corey Harold [phone] Are transportation arrangements needed? No  If their condition worsens, is the pt aware to call PCP or go to the Emergency Dept.? Yes Was the patient provided with contact information for the PCP's office or ED? Yes Was to pt encouraged to call back with questions or concerns? Yes  SDOH assessments and interventions completed:   Yes SDOH Interventions Today    Flowsheet Row Most Recent Value  SDOH Interventions   Food Insecurity Interventions Intervention Not Indicated  Housing Interventions Intervention Not Indicated  Transportation Interventions  Intervention Not Indicated       Care Coordination Interventions:  PCP follow up appointment requested   Encounter Outcome:  Pt. Visit Completed    [place] [facility] [phone]

## 2022-12-05 ENCOUNTER — Encounter: Payer: Medicare PPO | Admitting: Family

## 2022-12-07 ENCOUNTER — Ambulatory Visit: Payer: Medicare PPO | Admitting: Family Medicine

## 2022-12-07 ENCOUNTER — Encounter: Payer: Self-pay | Admitting: Family Medicine

## 2022-12-07 VITALS — BP 132/66 | HR 67 | Temp 97.3°F | Ht 62.0 in

## 2022-12-07 DIAGNOSIS — I5033 Acute on chronic diastolic (congestive) heart failure: Secondary | ICD-10-CM | POA: Diagnosis not present

## 2022-12-07 DIAGNOSIS — E440 Moderate protein-calorie malnutrition: Secondary | ICD-10-CM

## 2022-12-07 DIAGNOSIS — G20A1 Parkinson's disease without dyskinesia, without mention of fluctuations: Secondary | ICD-10-CM | POA: Diagnosis not present

## 2022-12-07 DIAGNOSIS — Z95818 Presence of other cardiac implants and grafts: Secondary | ICD-10-CM

## 2022-12-07 DIAGNOSIS — R0609 Other forms of dyspnea: Secondary | ICD-10-CM | POA: Diagnosis not present

## 2022-12-07 DIAGNOSIS — S72002D Fracture of unspecified part of neck of left femur, subsequent encounter for closed fracture with routine healing: Secondary | ICD-10-CM | POA: Diagnosis not present

## 2022-12-07 DIAGNOSIS — J4530 Mild persistent asthma, uncomplicated: Secondary | ICD-10-CM

## 2022-12-07 DIAGNOSIS — J4489 Other specified chronic obstructive pulmonary disease: Secondary | ICD-10-CM | POA: Diagnosis not present

## 2022-12-07 DIAGNOSIS — Z9889 Other specified postprocedural states: Secondary | ICD-10-CM

## 2022-12-07 DIAGNOSIS — I059 Rheumatic mitral valve disease, unspecified: Secondary | ICD-10-CM

## 2022-12-07 LAB — RENAL FUNCTION PANEL
Albumin: 4.1 g/dL (ref 3.5–5.2)
BUN: 17 mg/dL (ref 6–23)
CO2: 28 mEq/L (ref 19–32)
Calcium: 9.4 mg/dL (ref 8.4–10.5)
Chloride: 101 mEq/L (ref 96–112)
Creatinine, Ser: 0.79 mg/dL (ref 0.40–1.20)
GFR: 70.99 mL/min (ref >60.00)
Glucose, Bld: 87 mg/dL (ref 70–99)
Phosphorus: 3.6 mg/dL (ref 2.3–4.6)
Potassium: 4.1 mEq/L (ref 3.5–5.1)
Sodium: 137 mEq/L (ref 135–145)

## 2022-12-07 LAB — MAGNESIUM: Magnesium: 2.2 mg/dL (ref 1.5–2.5)

## 2022-12-07 NOTE — Progress Notes (Unsigned)
Patient ID: Crystal Bass, adult    DOB: Jul 26, 1941, 82 y.o.   MRN: [id number]  This visit was conducted in person.  BP 132/66   Pulse 67   Temp (!) 97.3 F (36.3 C) (Temporal)   Ht '5\' 2"'$  (1.575 m)   SpO2 97%   BMI 21.62 kg/m    CC: hosp f/u visit  Subjective:   HPI: Crystal Bass is a 82 y.o. adult presenting on 12/07/2022 for Hospitalization Follow-up (Admitted on 11/21/22 at [facility], dx hypervolemia; hypoxia; lung mass; pleural effusion. )   Previous hospitalization 08/2022 with L hip fracture s/p L hip hemiarthroplasty.   Recent hospitalization for acute exertional dyspnea and hypoxia found to have acute CHF exacerbation treated with IV lasix diuresis.  Hospital records reviewed. Med rec performed.  Now on lasix '20mg'$  BID, with potasium 15mq daily. Needs renal function checked.  CTA chest 11/21/2022 - found to have R pleural effusion.   She has been on trelegy for the past year - but now worrie she's unable to take deep enough breath to effectively administer medication via inhaler.   Known mod-severe MR with chronic HFpEF s/p MitraClip x2 01/2022 with recurrence of symptoms.   Home health not set up - [facility]  Other follow up appointments scheduled: CHF clinic needs to reschedule, cards clinic 12/11/2022. Due for pulm f/u.  ______________________________________________________________________ Hospital admission: 11/21/2022 Hospital discharge: 11/26/2022 TCM f/u phone call: performed 11/28/2022  Recommendations at discharge:  Patient and her brother were instructed to follow-up with PCP within 2 weeks.   Discharge Diagnoses: Principal Problem:   Acute exacerbation of CHF (congestive heart failure) (HCC) Active Problems:   Essential hypertension   Chronic obstructive airway disease with asthma ([place]   DNR (do not resuscitate)   Parkinson disease   Severe mitral regurgitation   S/P mitral valve clip implantation    Anxiety   Hypomagnesemia   Hypophosphatemia   Acute respiratory failure with hypoxia (HCC)     Relevant past medical, surgical, family and social history reviewed and updated as indicated. Interim medical history since our last visit reviewed. Allergies and medications reviewed and updated. Outpatient Medications Prior to Visit  Medication Sig Dispense Refill   acetaminophen (TYLENOL) 500 MG tablet Take 1,000 mg by mouth every 12 (twelve) hours as needed.     albuterol (VENTOLIN HFA) 108 (90 Base) MCG/ACT inhaler Inhale 2 puffs into the lungs every 6 (six) hours as needed for wheezing or shortness of breath. 18 g 1   amLODipine (NORVASC) 2.5 MG tablet Take 1 tablet (2.5 mg total) by mouth daily. 90 tablet 3   aspirin 81 MG tablet Take 1 tablet (81 mg total) by mouth daily. 30 tablet    carbidopa-levodopa ([name]) 25-100 MG tablet Take 1 tablet by mouth 2 (two) times daily. See other 3 listings all reflected from [facility]MR system [facility]     Carbidopa-Levodopa ER (SINEMET CR) 25-100 MG tablet controlled release Take 1-2 tablets by mouth 3 (three) times daily.     Cholecalciferol (VITAMIN D3) 25 MCG (1000 UT) CAPS Take 1 capsule (1,000 Units total) by mouth daily. 30 capsule    clonazePAM (KLONOPIN) 0.5 MG tablet Take 0.5 mg by mouth at bedtime.     EPINEPHrine 0.3 mg/0.3 mL IJ SOAJ injection Inject 0.3 mg into the muscle as needed for anaphylaxis.     ferrous sulfate 325 (65 FE) MG tablet Take 1 tablet (325  mg total) by mouth daily with breakfast. 90 tablet 1   fexofenadine (ALLEGRA) 180 MG tablet Take 180 mg by mouth daily.     Fluticasone-Umeclidin-Vilant (TRELEGY ELLIPTA) 100-62.5-25 MCG/ACT AEPB Inhale 1 puff into the lungs daily. 1 each 6   furosemide (LASIX) 20 MG tablet Take 1 tablet (20 mg total) by mouth 2 (two) times daily for 14 days. 28 tablet 0   gabapentin ([name]) 300 MG capsule TAKE 2 CAPSULES BY MOUTH 3 TIMES DAILY (Patient taking differently: Take 600 mg  by mouth 3 (three) times daily. TAKE 2 CAPSULES BY MOUTH 3 TIMES DAILY) 540 capsule 1   pantoprazole (PROTONIX) 20 MG tablet Take 20 mg by mouth daily.     potassium chloride SA (KLOR-CON M) 20 MEQ tablet Take 1 tablet (20 mEq total) by mouth daily. 60 tablet 1   senna (SENOKOT) 8.6 MG TABS tablet Take 1 tablet by mouth 2 (two) times daily.     No facility-administered medications prior to visit.     Per HPI unless specifically indicated in ROS section below Review of Systems  Objective:  BP 132/66   Pulse 67   Temp (!) 97.3 F (36.3 C) (Temporal)   Ht '5\' 2"'$  (1.575 m)   SpO2 97%   BMI 21.62 kg/m   Wt Readings from Last 3 Encounters:  11/22/22 118 lb 3.2 oz (53.6 kg)  09/25/22 123 lb 8 oz (56 kg)  09/22/22 125 lb (56.7 kg)      Physical Exam Vitals and nursing note reviewed.  Constitutional:      Appearance: Normal appearance.     Comments: Ambulates with walker  HENT:     Head: Normocephalic and atraumatic.  Eyes:     Extraocular Movements: Extraocular movements intact.     Pupils: Pupils are equal, round, and reactive to light.  Cardiovascular:     Rate and Rhythm: Normal rate and regular rhythm.     Pulses: Normal pulses.     Heart sounds: Murmur (4/6 systolic throughout) heard.  Pulmonary:     Effort: Pulmonary effort is normal. No respiratory distress.     Breath sounds: Rhonchi present. No wheezing or rales.     Comments: Coarse bibasilar crackles without significant decreased breath sounds Musculoskeletal:     Right lower leg: No edema.     Left lower leg: No edema.     Comments: Wearing knee high mild compression stockings  Skin:    General: Skin is warm and dry.     Findings: No rash.  Neurological:     Mental Status: She is alert.  Psychiatric:        Mood and Affect: Mood normal.        Behavior: Behavior normal.       Results for orders placed or performed during the hospital encounter of 11/21/22  Resp Panel by RT-PCR (Flu A&B, Covid) Anterior  Nasal Swab   Specimen: Anterior Nasal Swab  Result Value Ref Range   SARS Coronavirus 2 by RT PCR NEGATIVE NEGATIVE   Influenza A by PCR NEGATIVE NEGATIVE   Influenza B by PCR NEGATIVE NEGATIVE  Basic metabolic panel  Result Value Ref Range   Sodium 139 135 - 145 mmol/L   Potassium 3.0 (L) 3.5 - 5.1 mmol/L   Chloride 103 98 - 111 mmol/L   CO2 28 22 - 32 mmol/L   Glucose, Bld 107 (H) 70 - 99 mg/dL   BUN 14 8 - 23 mg/dL   Creatinine, Ser  0.55 0.44 - 1.00 mg/dL   Calcium 8.8 (L) 8.9 - 10.3 mg/dL   GFR, Estimated >60 >60 mL/min   Anion gap 8 5 - 15  CBC  Result Value Ref Range   WBC 4.4 4.0 - 10.5 K/uL   RBC 4.02 3.87 - 5.11 MIL/uL   Hemoglobin 11.9 (L) 12.0 - 15.0 g/dL   HCT 38.3 36.0 - 46.0 %   MCV 95.3 80.0 - 100.0 fL   MCH 29.6 26.0 - 34.0 pg   MCHC 31.1 30.0 - 36.0 g/dL   RDW 14.6 11.5 - 15.5 %   Platelets 147 (L) 150 - 400 K/uL   nRBC 0.0 0.0 - 0.2 %  Brain natriuretic peptide  Result Value Ref Range   B Natriuretic Peptide 719.9 (H) 0.0 - 100.0 pg/mL  Procalcitonin - Baseline  Result Value Ref Range   Procalcitonin <0.10 ng/mL  Phosphorus  Result Value Ref Range   Phosphorus 2.1 (L) 2.5 - 4.6 mg/dL  Magnesium  Result Value Ref Range   Magnesium 1.6 (L) 1.7 - 2.4 mg/dL  Basic metabolic panel  Result Value Ref Range   Sodium 138 135 - 145 mmol/L   Potassium 3.6 3.5 - 5.1 mmol/L   Chloride 101 98 - 111 mmol/L   CO2 30 22 - 32 mmol/L   Glucose, Bld 109 (H) 70 - 99 mg/dL   BUN 15 8 - 23 mg/dL   Creatinine, Ser 0.46 0.44 - 1.00 mg/dL   Calcium 7.7 (L) 8.9 - 10.3 mg/dL   GFR, Estimated >60 >60 mL/min   Anion gap 7 5 - 15  CBC  Result Value Ref Range   WBC 4.3 4.0 - 10.5 K/uL   RBC 3.57 (L) 3.87 - 5.11 MIL/uL   Hemoglobin 10.6 (L) 12.0 - 15.0 g/dL   HCT 34.1 (L) 36.0 - 46.0 %   MCV 95.5 80.0 - 100.0 fL   MCH 29.7 26.0 - 34.0 pg   MCHC 31.1 30.0 - 36.0 g/dL   RDW 14.5 11.5 - 15.5 %   Platelets 140 (L) 150 - 400 K/uL   nRBC 0.0 0.0 - 0.2 %  CBC with  Differential/Platelet  Result Value Ref Range   WBC 5.6 4.0 - 10.5 K/uL   RBC 3.96 3.87 - 5.11 MIL/uL   Hemoglobin 11.8 (L) 12.0 - 15.0 g/dL   HCT 37.8 36.0 - 46.0 %   MCV 95.5 80.0 - 100.0 fL   MCH 29.8 26.0 - 34.0 pg   MCHC 31.2 30.0 - 36.0 g/dL   RDW 14.8 11.5 - 15.5 %   Platelets 148 (L) 150 - 400 K/uL   nRBC 0.0 0.0 - 0.2 %   Neutrophils Relative % 56 %   Neutro Abs 3.1 1.7 - 7.7 K/uL   Lymphocytes Relative 22 %   Lymphs Abs 1.2 0.7 - 4.0 K/uL   Monocytes Relative 13 %   Monocytes Absolute 0.7 0.1 - 1.0 K/uL   Eosinophils Relative 8 %   Eosinophils Absolute 0.5 0.0 - 0.5 K/uL   Basophils Relative 1 %   Basophils Absolute 0.1 0.0 - 0.1 K/uL   Immature Granulocytes 0 %   Abs Immature Granulocytes 0.01 0.00 - 0.07 K/uL  Basic metabolic panel  Result Value Ref Range   Sodium 140 135 - 145 mmol/L   Potassium 3.4 (L) 3.5 - 5.1 mmol/L   Chloride 101 98 - 111 mmol/L   CO2 31 22 - 32 mmol/L   Glucose, Bld 85 70 -  99 mg/dL   BUN 17 8 - 23 mg/dL   Creatinine, Ser 0.55 0.44 - 1.00 mg/dL   Calcium 7.8 (L) 8.9 - 10.3 mg/dL   GFR, Estimated >60 >60 mL/min   Anion gap 8 5 - 15  Magnesium  Result Value Ref Range   Magnesium 2.4 1.7 - 2.4 mg/dL  Phosphorus  Result Value Ref Range   Phosphorus 3.8 2.5 - 4.6 mg/dL  Basic metabolic panel  Result Value Ref Range   Sodium 139 135 - 145 mmol/L   Potassium 4.1 3.5 - 5.1 mmol/L   Chloride 101 98 - 111 mmol/L   CO2 31 22 - 32 mmol/L   Glucose, Bld 90 70 - 99 mg/dL   BUN 15 8 - 23 mg/dL   Creatinine, Ser 0.47 0.44 - 1.00 mg/dL   Calcium 8.2 (L) 8.9 - 10.3 mg/dL   GFR, Estimated >60 >60 mL/min   Anion gap 7 5 - 15  CBC with Differential/Platelet  Result Value Ref Range   WBC 5.1 4.0 - 10.5 K/uL   RBC 4.15 3.87 - 5.11 MIL/uL   Hemoglobin 12.3 12.0 - 15.0 g/dL   HCT 39.6 36.0 - 46.0 %   MCV 95.4 80.0 - 100.0 fL   MCH 29.6 26.0 - 34.0 pg   MCHC 31.1 30.0 - 36.0 g/dL   RDW 14.8 11.5 - 15.5 %   Platelets 146 (L) 150 - 400 K/uL    nRBC 0.0 0.0 - 0.2 %   Neutrophils Relative % 55 %   Neutro Abs 2.8 1.7 - 7.7 K/uL   Lymphocytes Relative 20 %   Lymphs Abs 1.0 0.7 - 4.0 K/uL   Monocytes Relative 14 %   Monocytes Absolute 0.7 0.1 - 1.0 K/uL   Eosinophils Relative 10 %   Eosinophils Absolute 0.5 0.0 - 0.5 K/uL   Basophils Relative 1 %   Basophils Absolute 0.1 0.0 - 0.1 K/uL   Immature Granulocytes 0 %   Abs Immature Granulocytes 0.02 0.00 - 0.07 K/uL  Magnesium  Result Value Ref Range   Magnesium 2.2 1.7 - 2.4 mg/dL  CBC  Result Value Ref Range   WBC 5.1 4.0 - 10.5 K/uL   RBC 4.11 3.87 - 5.11 MIL/uL   Hemoglobin 12.3 12.0 - 15.0 g/dL   HCT 39.3 36.0 - 46.0 %   MCV 95.6 80.0 - 100.0 fL   MCH 29.9 26.0 - 34.0 pg   MCHC 31.3 30.0 - 36.0 g/dL   RDW 14.8 11.5 - 15.5 %   Platelets 152 150 - 400 K/uL   nRBC 0.0 0.0 - 0.2 %  Basic metabolic panel  Result Value Ref Range   Sodium 139 135 - 145 mmol/L   Potassium 3.9 3.5 - 5.1 mmol/L   Chloride 101 98 - 111 mmol/L   CO2 30 22 - 32 mmol/L   Glucose, Bld 94 70 - 99 mg/dL   BUN 11 8 - 23 mg/dL   Creatinine, Ser 0.57 0.44 - 1.00 mg/dL   Calcium 8.3 (L) 8.9 - 10.3 mg/dL   GFR, Estimated >60 >60 mL/min   Anion gap 8 5 - 15  Magnesium  Result Value Ref Range   Magnesium 2.4 1.7 - 2.4 mg/dL  Phosphorus  Result Value Ref Range   Phosphorus 3.0 2.5 - 4.6 mg/dL  Brain natriuretic peptide  Result Value Ref Range   B Natriuretic Peptide 247.6 (H) 0.0 - 100.0 pg/mL  Troponin I (High Sensitivity)  Result Value Ref Range  Troponin I (High Sensitivity) 16 <18 ng/L  Troponin I (High Sensitivity)  Result Value Ref Range   Troponin I (High Sensitivity) 15 <18 ng/L   *Note: Due to a large number of results and/or encounters for the requested time period, some results have not been displayed. A complete set of results can be found in Results Review.    Assessment & Plan:   Problem List Items Addressed This Visit     Acute on chronic diastolic CHF (congestive heart  failure) ([place]) - Primary   Relevant Orders   Renal function panel   Malnutrition of moderate degree   Relevant Orders   Renal function panel   Hypomagnesemia   Relevant Orders   Magnesium     No orders of the defined types were placed in this encounter.   Orders Placed This Encounter  Procedures   Renal function panel   Magnesium     Patient Instructions  Labs today  Reschedule congestive heart failure clinic appointment.  Call and schedule appointment with Dr Chase Caller - you may contact [facility] in [place] to schedule an appointment: [phone]. Let me know if any trouble scheduling appointment.  We will refer you to physical therapy at [facility].   Follow up plan: Return in about 3 months (around 03/08/2023), or if symptoms worsen or fail to improve, for annual exam, prior fasting for blood work.  [name], MD

## 2022-12-07 NOTE — Patient Instructions (Addendum)
Labs today  Reschedule congestive heart failure clinic appointment.  Call and schedule appointment with Dr Chase Caller - you may contact [facility] in [place] to schedule an appointment: [phone]. Let me know if any trouble scheduling appointment.  We will refer you to physical therapy at [facility].

## 2022-12-08 MED ORDER — FUROSEMIDE 40 MG PO TABS: 40.0000 mg | ORAL_TABLET | Freq: Every day | ORAL | 1 refills | Status: DC

## 2022-12-08 NOTE — Assessment & Plan Note (Addendum)
Continue sinemet followed by Dr Tat Neurology

## 2022-12-08 NOTE — Assessment & Plan Note (Signed)
Update magnesium levels off replacement.

## 2022-12-08 NOTE — Assessment & Plan Note (Signed)
S/p L hip hemiarthroplasty 08/2022  Will refer back to [facility] PT.

## 2022-12-08 NOTE — Assessment & Plan Note (Signed)
See above re Crystal Bass

## 2022-12-08 NOTE — Assessment & Plan Note (Addendum)
Recent hospitalization for acute CHF s/p IV diuresis with benefit.  Now on lasix '20mg'$  BID + potassium 36mq however doesn't note increased urine output on '20mg'$  lasix dose.  Will check renal panel and if stable, recommend change to '40mg'$  lasix once daily, update with effect on UOP.  I did encourage she reschedule CHF clinic appt as well as keep cardiology appt 12/11/2022.

## 2022-12-08 NOTE — Assessment & Plan Note (Addendum)
Most recently saw pulm 04/2022, on Trelegy which worked better than [employer].  However pt feels she may not be receiving effective delivery of medication with this MDI due to difficulty with deep inhalation.  I asked her to call for appt with pulm to discuss change in resspiratory medication if indicated.  To discuss pulmonary rehab with them.

## 2022-12-08 NOTE — Assessment & Plan Note (Addendum)
Recurrent mod MR after mitraClip.  Encouraged rescheduling CHF clinic f/u.  To discuss cardiac rehab with them.

## 2022-12-08 NOTE — Assessment & Plan Note (Signed)
Update albumin levels.

## 2022-12-11 ENCOUNTER — Encounter: Payer: Self-pay | Admitting: Cardiovascular Disease

## 2022-12-11 ENCOUNTER — Ambulatory Visit: Payer: Medicare PPO | Attending: Cardiovascular Disease | Admitting: Cardiovascular Disease

## 2022-12-11 VITALS — BP 118/80 | HR 68 | Ht 62.0 in | Wt 113.0 lb

## 2022-12-11 DIAGNOSIS — I340 Nonrheumatic mitral (valve) insufficiency: Secondary | ICD-10-CM | POA: Diagnosis not present

## 2022-12-11 DIAGNOSIS — I10 Essential (primary) hypertension: Secondary | ICD-10-CM | POA: Diagnosis not present

## 2022-12-11 DIAGNOSIS — I951 Orthostatic hypotension: Secondary | ICD-10-CM

## 2022-12-11 MED ORDER — FUROSEMIDE 40 MG PO TABS: 40.0000 mg | ORAL_TABLET | Freq: Every day | ORAL | 3 refills | Status: DC

## 2022-12-11 NOTE — Patient Instructions (Signed)
Medication Instructions:  FUROSEMIDE: Take 40 mg once daily. You may take an extra 20 mg in the afternoon for a weight gain of 3 pounds in 48 hours or 5 pounds in a week. *If you need a refill on your cardiac medications before your next appointment, please call your pharmacy*   Lab Work: None ordered If you have labs (blood work) drawn today and your tests are completely normal, you will receive your results only by: [name] (if you have MyChart) OR A paper copy in the mail If you have any lab test that is abnormal or we need to change your treatment, we will call you to review the results.   Testing/Procedures: None ordered   Follow-Up: At [facility], you and your health needs are our priority.  As part of our continuing mission to provide you with exceptional heart care, we have created designated Provider Care Teams.  These Care Teams include your primary Cardiologist (physician) and Advanced Practice Providers (APPs -  Physician Assistants and Nurse Practitioners) who all work together to provide you with the care you need, when you need it.  We recommend signing up for the patient portal called "MyChart".  Sign up information is provided on this After Visit Summary.  MyChart is used to connect with patients for Virtual Visits (Telemedicine).  Patients are able to view lab/test results, encounter notes, upcoming appointments, etc.  Non-urgent messages can be sent to your provider as well.   To learn more about what you can do with MyChart, go to [email or web address].    Your next appointment:   4 month(s)  Provider:   You may see [name], MD or one of the following Advanced Practice Providers on your designated Care Team:   Murray Hodgkins, NP Christell Faith, PA-C Cadence Kathlen Mody, PA-C Gerrie Nordmann, NP

## 2022-12-11 NOTE — Progress Notes (Signed)
Cardiology Office Note   Date:  12/11/2022   ID:  Crystal Bass, DOB [birth year], MRN [id number]  PCP:  [name], MD  Cardiologist:   [name], MD   Chief Complaint  Patient presents with   Other    4 month f/u no complaints today. Meds reviewed verbally with pt.      History of Present Illness: Crystal Bass is a 82 y.o. adult who is here today for follow-up visit regarding mitral regurgitation status post mitral valve clip.   She has chronic medical conditions that include mitral valve prolapse with regurgitation, exertional dyspnea and orthostatic hypotension in the setting of Parkinson's disease. She was diagnosed with Parkinson's in 2015 . She used to be on multiple blood pressure medications which were discontinued due to orthostatic hypotension.     She was hospitalized in early December, 2019 with atypical chest pain.  Initial CTA of the chest showed single small pulmonary embolism in the apical segmental branch of the left upper lobe.  There was also evidence of bronchiectasis.  Repeat CT after 2 days showed resolution of the pulmonary embolus.  The patient was treated with Eliquis for 3 months.   She had worsening exertional dyspnea in 2022.   She underwent an echocardiogram in October 2022 which showed normal LV systolic function, grade 2 diastolic dysfunction, small pericardial effusion and moderate to severe mitral regurgitation due to significant prolapse of the anterior mitral leaflet.  She has history of COPD but she never smoked.   Pulmonary function testing showed minimal obstructive disease.  A right and left cardiac catheterization was done in March which showed normal coronary arteries and normal LV systolic function.  Mitral regurgitation was moderate to severe by ventricular angiography and severe by hemodynamic evaluation.  Right heart catheterization showed mildly elevated filling pressures, moderate pulmonary hypertension and normal  cardiac output.  Giant V waves were noted on pulmonary capillary wedge tracings (34 mm Hg).  She underwent mitral valve clip procedure in March.  However, she had minimal improvement in symptoms since then and repeat echocardiogram showed evidence of severe mitral regurgitation confirmed by TEE.  She saw Dr. Burt Knack with plans to proceed with placement of a third mitral valve clip knowing that there is an increased risk of mitral valve stenosis.  The procedure was scheduled in July but the patient canceled the day before.    The patient was hospitalized in September after a fall and left hip fracture requiring surgery.  Echocardiogram while hospitalized showed an EF of 60 to 65% with grade 3 diastolic dysfunction, mild left ventricular hypertrophy, moderate to severe mitral regurgitation and moderate mitral stenosis.  She was recently hospitalized at [facility] due to shortness of breath.  CTA of the chest showed no evidence of pulmonary embolism.  She was found to have small to moderate right sized pleural effusion.  She was treated with IV furosemide with subsequent improvement.  She was discharged on furosemide 20 mg twice daily.  However, she reports no significant increase in urine output on this dose.  Her weight has decreased to baseline but she continues to have mild bilateral leg edema and she is likely more dyspneic than her baseline.  No chest pain.  Past Medical History:  Diagnosis Date   Aneurysm of aorta ([place])    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   Anginal pain ([place])    a. 10/2021 Cath: Nl cors.   Aortic root aneurysm ([place])  Arthritis    Asthma    Breast cancer ([place]) 2002   infiltrative ductal carcinoma    Cataract 2019   Chronic heart failure with preserved ejection fraction (HFpEF) (HCC)    COPD (chronic obstructive pulmonary disease) ([place]) 07/2007   Ductal carcinoma of breast, estrogen receptor positive, stage 1 ([place]) 09/16/2012   Endometriosis    GERD (gastroesophageal reflux  disease)    Hypercalcemia 09/14/2013   Hypertension    Lesion of breast 1992   right, benign   Lung disease    secondary to [name] infection   Mastalgia 05/1994   Mitral regurgitation    a. 10/2021 RHC: PCWP V wave of 34; b. 01/2022 TEE: Severe flail/prolapse of large area of A2 scallop with severe posteriorly directed MR; c. 01/2022 TEER w /Mitraclip XTW x 2; d. 07/2022 Echo: EF 60-65%. No rwma, mild LVH, GrIII DD. Sev dil LA. Mod elev PASP. Mod-Sev MR. Mod MS.   Osteoporosis, post-menopausal 03/14/2012   Ovarian tumor of borderline malignancy, right 2004   Palpitations    a. 02/2022 Zio: Predominantly sinus rhythm at 67 (52-171).  2 runs of NSVT -fastest 9 beats at 167.  35 SVT runs-longest 19 beats, fastest 171x6 beats.   Pancreatitis    secondary to Cholelithiasis   Parkinson disease 02/10/2015   Post-thoracotomy pain syndrome    Pseudomonas pneumonia ([place]) 03/2008   Status post implantation of mitral valve leaflet clip 02/15/2022   s/p XTW MitraClip x2 by Dr. Burt Knack in the A2/P2 position   Traumatic closed fracture of distal clavicle with minimal displacement, left, initial encounter 06/2021   [facility]   Uterine fibroid    Vitamin D deficiency     Past Surgical History:  Procedure Laterality Date   ABDOMINAL HYSTERECTOMY     & BSO for Mucinous borderline tumor of R ovary 2004   APPENDECTOMY  2004   BREAST BIOPSY  08/2004   right breast-benign   BREAST LUMPECTOMY  1992   benign   BUBBLE STUDY  12/27/2021   Procedure: BUBBLE STUDY;  Surgeon: Geralynn Rile, MD;  Location: [facility] ENDOSCOPY;  Service: Cardiovascular;;   CARDIAC CATHETERIZATION  2007   essentially negation for significant CAD   CHOLECYSTECTOMY  2001   COLONOSCOPY  2014   Dr [name] , due 2019   COLONOSCOPY  09/2018   2 TA removed, int hem, no f/u needed (Dr [name])    ESOPHAGOGASTRODUODENOSCOPY  09/2018   GERD with esophagitis and stricture dilated, antral gastritis negative for H pylori [name])   HIP  ARTHROPLASTY Left 08/21/2022   Procedure: ARTHROPLASTY BIPOLAR HIP (HEMIARTHROPLASTY);  Surgeon: Thornton Park, MD;  Location: [facility] ORS;  Service: Orthopedics;  Laterality: Left;   LUNG BIOPSY  2002   [name], Dr [name]   MASTECTOMY MODIFIED RADICAL Left 2002   oral chemotheraphy (tamoxifen then Armidex) no radiation, Dr.[name]   MITRAL VALVE REPAIR N/A 02/15/2022   Procedure: MITRAL VALVE REPAIR;  Surgeon: Sherren Mocha, MD;  Location: [place] CV LAB;  Service: Cardiovascular;  Laterality: N/A;   RIGHT/LEFT HEART CATH AND CORONARY ANGIOGRAPHY Bilateral 11/14/2021   Procedure: RIGHT/LEFT HEART CATH AND CORONARY ANGIOGRAPHY;  Surgeon: Wellington Hampshire, MD;  Location: [place] CV LAB;  Service: Cardiovascular;  Laterality: Bilateral;   TEE WITHOUT CARDIOVERSION N/A 12/29/2018   Procedure: TRANSESOPHAGEAL ECHOCARDIOGRAM (TEE);  Surgeon: Wellington Hampshire, MD;  Location: [facility] ORS;  Service: Cardiovascular;  Laterality: N/A;   TEE WITHOUT CARDIOVERSION N/A 12/27/2021   Procedure: TRANSESOPHAGEAL ECHOCARDIOGRAM (TEE);  Surgeon:  Geralynn Rile, MD;  Location: [place];  Service: Cardiovascular;  Laterality: N/A;   TEE WITHOUT CARDIOVERSION N/A 02/15/2022   Procedure: TRANSESOPHAGEAL ECHOCARDIOGRAM (TEE);  Surgeon: Sherren Mocha, MD;  Location: [place] CV LAB;  Service: Cardiovascular;  Laterality: N/A;   TEE WITHOUT CARDIOVERSION N/A 05/14/2022   Procedure: TRANSESOPHAGEAL ECHOCARDIOGRAM (TEE);  Surgeon: Geralynn Rile, MD;  Location: [place];  Service: Cardiovascular;  Laterality: N/A;   TONSILLECTOMY  1946     Current Outpatient Medications  Medication Sig Dispense Refill   acetaminophen (TYLENOL) 500 MG tablet Take 1,000 mg by mouth every 12 (twelve) hours as needed.     albuterol (VENTOLIN HFA) 108 (90 Base) MCG/ACT inhaler Inhale 2 puffs into the lungs every 6 (six) hours as needed for wheezing or shortness of breath. 18 g 1   amLODipine (NORVASC) 2.5 MG  tablet Take 1 tablet (2.5 mg total) by mouth daily. 90 tablet 3   aspirin 81 MG tablet Take 1 tablet (81 mg total) by mouth daily. 30 tablet    carbidopa-levodopa ([name]) 25-100 MG tablet Take 1 tablet by mouth 2 (two) times daily. See other 3 listings all reflected from [place] EMR system [employer] [facility]     Carbidopa-Levodopa ER (SINEMET CR) 25-100 MG tablet controlled release Take 1-2 tablets by mouth 3 (three) times daily.     Cholecalciferol (VITAMIN D3) 25 MCG (1000 UT) CAPS Take 1 capsule (1,000 Units total) by mouth daily. 30 capsule    clonazePAM (KLONOPIN) 0.5 MG tablet Take 0.5 mg by mouth at bedtime.     EPINEPHrine 0.3 mg/0.3 mL IJ SOAJ injection Inject 0.3 mg into the muscle as needed for anaphylaxis.     ferrous sulfate 325 (65 FE) MG tablet Take 1 tablet (325 mg total) by mouth daily with breakfast. 90 tablet 1   fexofenadine (ALLEGRA) 180 MG tablet Take 180 mg by mouth daily.     Fluticasone-Umeclidin-Vilant (TRELEGY ELLIPTA) 100-62.5-25 MCG/ACT AEPB Inhale 1 puff into the lungs daily. 1 each 6   furosemide (LASIX) 40 MG tablet Take 1 tablet (40 mg total) by mouth daily. 90 tablet 1   gabapentin ([name]) 300 MG capsule TAKE 2 CAPSULES BY MOUTH 3 TIMES DAILY (Patient taking differently: Take 600 mg by mouth 3 (three) times daily. TAKE 2 CAPSULES BY MOUTH 3 TIMES DAILY) 540 capsule 1   pantoprazole (PROTONIX) 20 MG tablet Take 20 mg by mouth daily.     potassium chloride SA (KLOR-CON M) 20 MEQ tablet Take 1 tablet (20 mEq total) by mouth daily. 60 tablet 1   senna (SENOKOT) 8.6 MG TABS tablet Take 1 tablet by mouth 2 (two) times daily. (Patient not taking: Reported on 12/11/2022)     No current facility-administered medications for this visit.    Allergies:   Sulfa antibiotics, Sulfonamide derivatives, Topamax [topiramate], Clarithromycin, Hydrocodone, Motrin [ibuprofen], Misc. sulfonamide containing compounds, Symbicort [budesonide-formoterol fumarate], Tetracycline hcl,  Percocet [oxycodone-acetaminophen], Tape, and Tetracyclines & related    Social History:  The patient  reports that she has never smoked. She has never used smokeless tobacco. She reports that she does not drink alcohol and does not use drugs.   Family History:  The patient's family history includes Breast cancer in her cousin; Breast cancer (age of onset: 49) in her sister; Cancer in her maternal grandfather, maternal grandmother, maternal uncle, maternal uncle, and maternal uncle; Colon cancer in her sister; Colon cancer (age of onset: 57) in her cousin; Emphysema in her mother;  Liver cancer in her maternal aunt; Lung cancer (age of onset: 77) in her cousin; Lung cancer (age of onset: 29) in her father; Melanoma in her maternal aunt; Myasthenia gravis in her paternal aunt; Osteoporosis in her paternal aunt; Parkinson's disease in her cousin; Stroke in her paternal aunt.    ROS:  Please see the history of present illness.   Otherwise, review of systems are positive for none.   All other systems are reviewed and negative.    PHYSICAL EXAM: VS:  BP 118/80 (BP Location: Right Arm, Patient Position: Sitting, Cuff Size: Normal)   Pulse 68   Ht '5\' 2"'$  (1.575 m)   Wt 113 lb (51.3 kg)   SpO2 93%   BMI 20.67 kg/m  , BMI Body mass index is 20.67 kg/m. GEN: Well nourished, well developed, in no acute distress  HEENT: normal  Neck: Mild JVD, carotid bruits, or masses Cardiac: RRR; no rubs, or gallops .  2/6 holosystolic murmur at the apex and the left sternal border.  Mild bilateral leg edema Respiratory:  clear to auscultation bilaterally, normal work of breathing GI: soft, nontender, nondistended, + BS MS: no deformity or atrophy  Skin: warm and dry, no rash Neuro:  Strength and sensation are intact Psych: euthymic mood, full affect   EKG:  EKG is ordered today. EKG showed sinus rhythm with sinus arrhythmia, LVH with repolarization abnormalities.   Recent Labs: 03/23/2022: TSH  0.84 04/30/2022: NT-Pro BNP 403 09/25/2022: ALT 3 11/25/2022: B Natriuretic Peptide 247.6; Hemoglobin 12.3; Platelets 152 12/07/2022: BUN 17; Creatinine, Ser 0.79; Magnesium 2.2; Potassium 4.1; Sodium 137    Lipid Panel    Component Value Date/Time   CHOL 171 08/08/2021 1320   TRIG 100.0 08/08/2021 1320   HDL 53.30 08/08/2021 1320   CHOLHDL 3 08/08/2021 1320   VLDL 20.0 08/08/2021 1320   LDLCALC 97 08/08/2021 1320   LDLDIRECT 135.0 08/16/2009 1359      Wt Readings from Last 3 Encounters:  12/11/22 113 lb (51.3 kg)  11/22/22 118 lb 3.2 oz (53.6 kg)  09/25/22 123 lb 8 oz (56 kg)          No data to display            ASSESSMENT AND PLAN:  1.   Mitral valve prolapse : Status post mitral valve clip but unfortunately she has recurrent severe mitral regurgitation.  She elected not to proceed with another mitral valve clip.  She is currently [place] heart association class III.   She had recent hospitalization for heart failure and does not seem to be responding very well to small dose furosemide 20 mg twice daily.  I asked him to change the dose to 40 mg once daily and she can take an extra dose of 20 mg in the afternoon for weight gain of more than 3 pounds in 48 hours or 5 pounds in 1 week.  2.  Orthostatic hypotension: Her symptoms seem to be improved compared to before.  3.  Essential hypertension: Blood pressure is controlled on small dose amlodipine.  4.  Parkinson's disease: Neurologic symptoms seem to be stable.    Disposition: Follow-up with me in 4 months.  Signed,  [name], MD  12/11/2022 2:23 PM    [place]

## 2022-12-13 ENCOUNTER — Other Ambulatory Visit: Payer: Self-pay | Admitting: Family Medicine

## 2022-12-13 DIAGNOSIS — I5033 Acute on chronic diastolic (congestive) heart failure: Secondary | ICD-10-CM

## 2022-12-13 DIAGNOSIS — Z95818 Presence of other cardiac implants and grafts: Secondary | ICD-10-CM

## 2022-12-13 DIAGNOSIS — J4489 Other specified chronic obstructive pulmonary disease: Secondary | ICD-10-CM

## 2022-12-13 DIAGNOSIS — G20A1 Parkinson's disease without dyskinesia, without mention of fluctuations: Secondary | ICD-10-CM

## 2022-12-13 DIAGNOSIS — R0609 Other forms of dyspnea: Secondary | ICD-10-CM

## 2022-12-13 DIAGNOSIS — S72002D Fracture of unspecified part of neck of left femur, subsequent encounter for closed fracture with routine healing: Secondary | ICD-10-CM

## 2022-12-13 DIAGNOSIS — I059 Rheumatic mitral valve disease, unspecified: Secondary | ICD-10-CM

## 2022-12-13 NOTE — Progress Notes (Signed)
Pharmacy care management referral placed

## 2022-12-14 ENCOUNTER — Telehealth: Payer: Self-pay

## 2022-12-14 NOTE — Progress Notes (Signed)
Care Management & Coordination Services Pharmacy Team  Reason for Encounter: Appointment Reminder  Contacted patient on 12/14/2022 Unsuccessful attempt to reach patient. Left patient message reminding patient of appointment.   Recent office visits:  12/07/22-Javier Gutierrez,MD(PCP)-hospital f/u,labs,(kidney function remains stable, potassium and magnesium levels are also ok as is sugar) recommend change to '40mg'$  lasix once daily,referral for PT 09/25/22-Javier Gutierrez,MD(PCP)-f/u left hip fracture.labs,( anemia is improving with hgb up to 11.4. Vitamin D level returned low - rec start 1000 IU daily OTC. Vitamin B12 borderline low normal - consider OTC b12 supplement.Kidneys, liver returned normal,Your urine test returned reassuringly normal today). 09/21/22-Javier Gutierrez,MD(PCP)-telemedicine-urinary frequency-colect urine sample and bring in. Rx keflex '500mg'$  TID 5d course.   Recent consult visits:  12/11/22-Muhammad Arida,MD(cardio)-f/u,EKG -FUROSEMIDE: Take 40 mg once daily. You may take an extra 20 mg in the afternoon for a weight gain of 3 pounds in 48 hours or 5 pounds in a week, f/u 4 months 10/31/22-[facility]-no data 09/11/22-Christopher Berge,NP(cardio)-f/u mitraclip-no medication changes  09/05/22- [facility]- no data 09/03/22-Tina Hackney,NP(cardio)-f/u shortness of breath,document daily weights,no medication changes, f/u 6 weeks 08/09/22-Muhammad Arida,MD(cardio)-f/u mitral valve regurgitation,no medication changes f/u 4 months 07/17/22-[facility]- no data   Hospital visits:  Medication Reconciliation was completed by comparing discharge summary, patient's EMR and Pharmacy list, and upon discussion with patient.  Admitted to the hospital on 11/21/22 due to Acute exacerbation CHF. Discharge date was 11/26/22. Discharged from [facility].    Medication Changes at Hospital Discharge: -Changed Furosemide  Medications Discontinued at Hospital Discharge: -Stopped   Cephalexin  Lovenox injection  Medications that remain the same after Hospital Discharge:??  -All other medications will remain the same.    09/22/22-[place] at [place] hip pain-X-ray obtained from triage shows hardware is in the correct place. will give Tylenol and Lidoderm patch. Discharged  Medication Reconciliation was completed by comparing discharge summary, patient's EMR and Pharmacy list, and upon discussion with patient.  Admitted to the ED on 08/19/22 due to Left hip fracture . Discharge date was 08/24/22. Discharged from [facility].    New?Medications Started at [facility] Discharge:?? -started Lovenox injections  Ferrous sulfate  Methocarbamol  Morphine  Senna  tramadol   Medications Discontinued at Hospital Discharge: -Stopped amoxicillin  Chloraseptic Lozenge  Medications that remain the same after Hospital Discharge:??  -All other medications will remain the same.     Medications: Outpatient Encounter Medications as of 12/14/2022  Medication Sig Note   acetaminophen (TYLENOL) 500 MG tablet Take 1,000 mg by mouth every 12 (twelve) hours as needed.    albuterol (VENTOLIN HFA) 108 (90 Base) MCG/ACT inhaler Inhale 2 puffs into the lungs every 6 (six) hours as needed for wheezing or shortness of breath.    amLODipine (NORVASC) 2.5 MG tablet Take 1 tablet (2.5 mg total) by mouth daily.    aspirin 81 MG tablet Take 1 tablet (81 mg total) by mouth daily.    carbidopa-levodopa ([name]) 25-100 MG tablet Take 1 tablet by mouth 2 (two) times daily. See other 3 listings all reflected from [facility] EMR system [employer] [facility]    Carbidopa-Levodopa ER (SINEMET CR) 25-100 MG tablet controlled release Take 1-2 tablets by mouth 3 (three) times daily. 11/21/2022: Patient takes 2 tablets ER every morning at 9 am, 1 tablet ER daily at 1 pm, and 1 tablet (ER) daily at 5 pm. (NOT currently taking Immediate release tablets) Verified by Cherrie Distance at [place] 11/21/22  at 1556.   Cholecalciferol (VITAMIN D3) 25 MCG (1000 UT) CAPS  Take 1 capsule (1,000 Units total) by mouth daily. 11/21/2022: New medication, not started yet   clonazePAM (KLONOPIN) 0.5 MG tablet Take 0.5 mg by mouth at bedtime.    EPINEPHrine 0.3 mg/0.3 mL IJ SOAJ injection Inject 0.3 mg into the muscle as needed for anaphylaxis.    ferrous sulfate 325 (65 FE) MG tablet Take 1 tablet (325 mg total) by mouth daily with breakfast.    fexofenadine (ALLEGRA) 180 MG tablet Take 180 mg by mouth daily.    Fluticasone-Umeclidin-Vilant (TRELEGY ELLIPTA) 100-62.5-25 MCG/ACT AEPB Inhale 1 puff into the lungs daily.    furosemide (LASIX) 40 MG tablet Take 1 tablet (40 mg total) by mouth daily. May take an extra 20 mg in the afternoon for a weight gain of 3 lbs in 48 hours or 5 lbs in a week    gabapentin ([name]) 300 MG capsule TAKE 2 CAPSULES BY MOUTH 3 TIMES DAILY (Patient taking differently: Take 600 mg by mouth 3 (three) times daily. TAKE 2 CAPSULES BY MOUTH 3 TIMES DAILY)    pantoprazole (PROTONIX) 20 MG tablet Take 20 mg by mouth daily.    potassium chloride SA (KLOR-CON M) 20 MEQ tablet Take 1 tablet (20 mEq total) by mouth daily.    senna (SENOKOT) 8.6 MG TABS tablet Take 1 tablet by mouth 2 (two) times daily. (Patient not taking: Reported on 12/11/2022)    No facility-administered encounter medications on file as of 12/14/2022.   Lab Results  Component Value Date/Time   HGBA1C 5.3 01/14/2018 11:21 AM   HGBA1C 5.4 05/19/2014 03:58 PM   MICROALBUR 7.1 (H) 01/22/2020 01:09 PM    BP Readings from Last 3 Encounters:  12/11/22 118/80  12/07/22 132/66  11/26/22 114/66     Patient contacted to confirm in office appointment with Charlene Brooke,   PharmD, on 12/19/22 at 2:00.   Star Rating Drugs:  Medication:  Last Fill: Day Supply No star medications identified  Care Gaps: Annual wellness visit in last year? Yes  Charlene Brooke, PharmD notified  Avel Sensor, [place] Assistant [phone]

## 2022-12-17 ENCOUNTER — Telehealth: Payer: Self-pay

## 2022-12-17 NOTE — Progress Notes (Signed)
Entered in error

## 2022-12-19 ENCOUNTER — Ambulatory Visit: Payer: Medicare PPO | Admitting: Pharmacist

## 2022-12-19 NOTE — Progress Notes (Signed)
Care Management & Coordination Services Pharmacy Note  12/19/2022 Name:  Crystal Bass MRN:  [id number] DOB:  1941-08-12  Summary: Initial office visit -HF: pt is compliant with furosemide as prescribed; she weighs herself daily, dry weight ~113 lbs, she has been 115 lbs the last 2 days and has taken an extra dose of furosemide yesterday; reviewed diuretic instructions per cardiology -COPD: pt uses Trelegy every day, though she does report she sometimes feels like she is not getting the full dose due to inability to take a large enough breath; discussed possibility of switching to an inhaler that is not reliant on patient's breath (Respimat, HFA), pt declined at she feels symptoms are under control currently -Osteoporosis: pt is up to date with Prolia injections; discussed fall risk at length in s/o sedating medications (clonazepam, gabapentin), currently she uses Rollator to get around  Recommendations/Changes made from today's visit: -Advised pt to call cardiology if wt is not back to dry weight over next 2-3 days -Advised pt to monitor BP at home and contact provider if BP is < 100/60 -No changes  Follow up plan: -Health Concierge will call patient 1 month for HF update -Pharmacist follow up televisit scheduled for 3 months -Neurology appt 01/29/23; HF clinic appt 02/11/23; Cardiology appt 04/19/23    Subjective: Crystal Bass is an 82 y.o. year old adult who is a primary patient of [name], MD.  The care coordination team was consulted for assistance with disease management and care coordination needs.    Engaged with patient face to face for initial visit. Patient lives independently at [facility]. She does drive herself to appts and to pick up prescriptions. Her meals are a mix of her own cooking and the facility's meals. She sets up medications each day in pill cups. She uses a rollator to get around her home most of the time, occasionally will walk without  it.  Recent office visits: 12/07/22 Dr [name] OV: hospital f/u - update Lasix to 40 mg once daily.[name] BMP stable. Referred to PT. Reschedule CHF clinic appt.  09/25/22-Javier Gutierrez,MD(PCP)-f/u left hip fracture.labs,( anemia is improving with hgb up to 11.4. Vitamin D level returned low - rec start 1000 IU daily OTC. Vitamin B12 borderline low normal - consider OTC b12 supplement.Kidneys, liver returned normal,Your urine test returned reassuringly normal today).  09/21/22-Javier Gutierrez,MD(PCP)-telemedicine-urinary frequency-colect urine sample and bring in. Rx keflex '500mg'$  TID 5d course.   Recent consult visits: 12/11/22 Dr Fletcher Anon (Cardiology): f/u - updated lasix 40 mg rx - may take extra 1/2 tab in PM for wt gain 3+ lbs in 48h or 5+ lbs in a week 09/11/22-Christopher Berge,NP(cardio)-f/u mitraclip-no medication changes  09/05/22- [facility]- no data 09/03/22-Tina Hackney,NP(cardio)-f/u shortness of breath,document daily weights,no medication changes, f/u 6 weeks 08/09/22-Muhammad Arida,MD(cardio)-f/u mitral valve regurgitation,no medication changes f/u 4 months 07/17/22-[facility]- no data   Hospital visits: 11/21/22 - 11/26/22 Admission [facility] acute CHF exacerbation. IV lasix. Discharged on higher dose of Lasix  - 20 mg BID.  09/22/22 ED visit [facility]): L hip pain. S/p L hip surgery 08/21/22, discharged to SNF. Given lidocaine patch in ED.   08/20/22 - 08/24/22 Admission [facility]): closed L hip fracture d/t fall.   Objective:  Lab Results  Component Value Date   CREATININE 0.79 12/07/2022   BUN 17 12/07/2022   GFR 70.99 12/07/2022   EGFR 84 04/30/2022   GFRNONAA >60 11/25/2022   GFRAA 66 12/23/2018   NA 137 12/07/2022   K 4.1 12/07/2022   CALCIUM 9.4  12/07/2022   CO2 28 12/07/2022   GLUCOSE 87 12/07/2022    Lab Results  Component Value Date/Time   HGBA1C 5.3 01/14/2018 11:21 AM   HGBA1C 5.4 05/19/2014 03:58 PM   GFR 70.99 12/07/2022 12:23 PM   GFR 82.19 09/25/2022 01:40  PM   MICROALBUR 7.1 (H) 01/22/2020 01:09 PM    Last diabetic Eye exam: No results found for: "HMDIABEYEEXA"  Last diabetic Foot exam: No results found for: "HMDIABFOOTEX"   Lab Results  Component Value Date   CHOL 171 08/08/2021   HDL 53.30 08/08/2021   LDLCALC 97 08/08/2021   LDLDIRECT 135.0 08/16/2009   TRIG 100.0 08/08/2021   CHOLHDL 3 08/08/2021       Latest Ref Rng & Units 12/07/2022   12:23 PM 09/25/2022    1:40 PM 06/15/2022   11:13 AM  Hepatic Function  Total Protein 6.0 - 8.3 g/dL  7.2  7.3   Albumin 3.5 - 5.2 g/dL 4.1  3.9  4.0   AST 0 - 37 U/L  13  16   ALT 0 - 35 U/L  3  <5   Alk Phosphatase 39 - 117 U/L  82  62   Total Bilirubin 0.2 - 1.2 mg/dL  1.1  1.7   Bilirubin, Direct 0.0 - 0.3 mg/dL  0.2      Lab Results  Component Value Date/Time   TSH 0.84 03/23/2022 12:21 PM   TSH 1.893 10/26/2018 12:23 PM   TSH 1.52 10/23/2017 12:26 PM   FREET4 0.93 02/09/2015 08:53 AM       Latest Ref Rng & Units 11/25/2022    4:58 AM 11/24/2022    4:32 AM 11/23/2022    4:31 AM  CBC  WBC 4.0 - 10.5 K/uL 5.1  5.1  5.6   Hemoglobin 12.0 - 15.0 g/dL 12.3  12.3  11.8   Hematocrit 36.0 - 46.0 % 39.3  39.6  37.8   Platelets 150 - 400 K/uL 152  146  148     Lab Results  Component Value Date/Time   VD25OH 23.69 (L) 09/25/2022 01:40 PM   VD25OH 29.58 (L) 08/08/2021 01:20 PM   VITAMINB12 350 09/25/2022 01:40 PM   VITAMINB12 384 08/08/2021 01:20 PM    Clinical ASCVD: Yes  - aortic atherosclerosis The 10-year ASCVD risk score (Arnett DK, et al., 2019) is: 27.2%   Values used to calculate the score:     Age: 82 years     Sex: Female     Is Non-Hispanic African American: No     Diabetic: No     Tobacco smoker: No     Systolic Blood Pressure: 403 mmHg     Is BP treated: Yes     HDL Cholesterol: 53.3 mg/dL     Total Cholesterol: 171 mg/dL       12/07/2022   11:43 AM 09/03/2022    2:04 PM 08/13/2022    1:11 PM  Depression screen PHQ 2/9  Decreased Interest 0 0 1  Down,  Depressed, Hopeless 0 0 1  PHQ - 2 Score 0 0 2  Altered sleeping   0  Tired, decreased energy   0  Change in appetite   0  Feeling bad or failure about yourself    0  Trouble concentrating   0  Moving slowly or fidgety/restless   0  Suicidal thoughts   0  PHQ-9 Score   2  Difficult doing work/chores  Not difficult at all Not difficult at  all     Social History   Tobacco Use  Smoking Status Never  Smokeless Tobacco Never   BP Readings from Last 3 Encounters:  12/11/22 118/80  12/07/22 132/66  11/26/22 114/66   Pulse Readings from Last 3 Encounters:  12/11/22 68  12/07/22 67  11/26/22 62   Wt Readings from Last 3 Encounters:  12/11/22 113 lb (51.3 kg)  11/22/22 118 lb 3.2 oz (53.6 kg)  09/25/22 123 lb 8 oz (56 kg)   BMI Readings from Last 3 Encounters:  12/11/22 20.67 kg/m  12/07/22 21.62 kg/m  11/22/22 21.62 kg/m    Allergies  Allergen Reactions   Sulfa Antibiotics Anaphylaxis   Sulfonamide Derivatives Anaphylaxis   Topamax [Topiramate] Other (See Comments)    Metabolic acidosis    Clarithromycin Other (See Comments)    pericarditis Other reaction(s): Not available   Hydrocodone Other (See Comments)    "hyper and climbing the walls"   Motrin [Ibuprofen] Other (See Comments)    headaches   Misc. Sulfonamide Containing Compounds     Other reaction(s): Not available   Symbicort [Budesonide-Formoterol Fumarate] Other (See Comments)    02/07/15 tremor   Tetracycline Hcl Other (See Comments)   Percocet [Oxycodone-Acetaminophen] Itching and Rash   Tape Itching and Rash    Use paper tape only   Tetracyclines & Related Other (See Comments)    "immediate yeast infection"    Medications Reviewed Today     Reviewed by Charlton Haws, [facility] (Pharmacist) on 12/20/22 at 1102  Med List Status: <None>   Medication Order Taking? Sig Documenting Provider Last Dose Status Informant  acetaminophen (TYLENOL) 500 MG tablet [id number] Yes Take 1,000 mg by mouth every  12 (twelve) hours as needed. [provider] Taking Active Self  albuterol (VENTOLIN HFA) 108 (90 Base) MCG/ACT inhaler [id number] Yes Inhale 2 puffs into the lungs every 6 (six) hours as needed for wheezing or shortness of breath. [name], MD Taking Active Self  amLODipine (NORVASC) 2.5 MG tablet [id number] Yes Take 1 tablet (2.5 mg total) by mouth daily. Wellington Hampshire, MD Taking Active Self  aspirin 81 MG tablet [id number] Yes Take 1 tablet (81 mg total) by mouth daily. [name], MD Taking Active Self  calcium carbonate (TUMS - DOSED IN MG ELEMENTAL CALCIUM) 500 MG chewable tablet [id number] Yes Chew 1 tablet by mouth as needed for indigestion or heartburn. [provider] Taking Active   Carbidopa-Levodopa ER (SINEMET CR) 25-100 MG tablet controlled release [id number] Yes Take 1-2 tablets by mouth 3 (three) times daily. [provider] Taking Active Self, Pharmacy Records           Med Note Arby Barrette   Wed Nov 21, 2022  3:57 PM) Patient takes 2 tablets ER every morning at 9 am, 1 tablet ER daily at 1 pm, and 1 tablet (ER) daily at 5 pm. (NOT currently taking Immediate release tablets) Verified by Cherrie Distance at [place] 11/21/22 at 1556.  Cholecalciferol (VITAMIN D3) 25 MCG (1000 UT) CAPS [id number] Yes Take 1 capsule (1,000 Units total) by mouth daily. [name], MD Taking Active Self           Med Note Rolan Bucco Dec 20, 2022 10:53 AM)    clonazePAM (KLONOPIN) 0.5 MG tablet [id number] Yes Take 0.5 mg by mouth at bedtime. [provider] Taking Active Self  EPINEPHrine 0.3 mg/0.3 mL IJ SOAJ injection [id number] Yes Inject 0.3 mg into the muscle  as needed for anaphylaxis. [provider] Taking Active Self           Med Note [facility] MENDEZ, CARLOS A   Mon Mar 19, 2022 12:36 PM)    ferrous sulfate 325 (65 FE) MG tablet [id number] Yes Take 1 tablet (325 mg total) by mouth daily with breakfast.  [name], MD Taking Active Self  fexofenadine [facility]) 180 MG tablet [id number] Yes Take 180 mg by mouth daily. [provider] Taking Active Self  Fluticasone-Umeclidin-Vilant (TRELEGY ELLIPTA) 100-62.5-25 MCG/ACT AEPB [id number] Yes Inhale 1 puff into the lungs daily. Clayton Bibles, NP Taking Active Self  furosemide (LASIX) 40 MG tablet [id number] Yes Take 1 tablet (40 mg total) by mouth daily. May take an extra 20 mg in the afternoon for a weight gain of 3 lbs in 48 hours or 5 lbs in a week Wellington Hampshire, MD Taking Active   gabapentin ([name]) 300 MG capsule [id number] Yes TAKE 2 CAPSULES BY MOUTH 3 TIMES DAILY  Patient taking differently: Take 600 mg by mouth 3 (three) times daily. TAKE 2 CAPSULES BY MOUTH 3 TIMES DAILY   [name], MD Taking Active Self  pantoprazole (PROTONIX) 20 MG tablet [id number] Yes Take 20 mg by mouth daily. [provider] Taking Active Self  potassium chloride SA (KLOR-CON M) 20 MEQ tablet [id number] Yes Take 1 tablet (20 mEq total) by mouth daily. Tommie Raymond, NP Taking Active Self            SDOH:  (Social Determinants of Health) assessments and interventions performed: No SDOH Interventions    Flowsheet Row Telephone from 11/28/2022 in [place] ED to Hosp-Admission (Discharged) from 11/21/2022 in [place] [facility] PCU Clinical Support from 08/13/2022 in [place] [facility] [place] from 05/09/2022 in [facility] Cardiac Rehab from 03/21/2022 in [facility] Cardiac and Pulmonary Rehab Clinical Support from 08/05/2021 in [place] [facility] [place] Interventions Intervention Not Indicated -- Intervention Not Indicated -- -- Intervention Not Indicated  Housing Interventions Intervention Not Indicated -- Intervention Not Indicated -- -- Intervention Not Indicated   Transportation Interventions Intervention Not Indicated Intervention Not Indicated Intervention Not Indicated -- -- Intervention Not Indicated  Utilities Interventions -- -- Intervention Not Indicated -- -- --  Alcohol Usage Interventions -- -- Intervention Not Indicated (Score <7) -- -- --  Depression Interventions/Treatment  -- -- -- PHQ2-9 Score <4 Follow-up Not Indicated Counseling --  Financial Strain Interventions -- -- Intervention Not Indicated -- -- --  Physical Activity Interventions -- -- Intervention Not Indicated -- -- Other (Comments)  [due to recent fall and recovering from that]  Stress Interventions -- -- Intervention Not Indicated -- -- Intervention Not Indicated  Social Connections Interventions -- -- Intervention Not Indicated -- -- Intervention Not Indicated       Medication Assistance: None required.  Patient affirms current coverage meets needs.  Medication Access: Within the past 30 days, how often has patient missed a dose of medication? 0 Is a pillbox or other method used to improve adherence? Yes  Factors that may affect medication adherence? no barriers identified Are meds synced by current pharmacy? No  Are meds delivered by current pharmacy? No  Does patient experience delays in picking up medications due to transportation concerns? No   [facility] Services Reviewed: Is patient disadvantaged to use [facility]?: No  Current Rx insurance plan: [employer] Name and location of Current pharmacy:  [place], [place] [address] Phone: [phone] Fax: [place], [place] [address] [place] [address] Phone: [phone] Fax: [phone]  [facility] services reviewed with patient today?: No  Patient requests to transfer care to [facility]?: No  Reason patient declined to change pharmacies:  Loyalty to other pharmacy/Patient preference  Compliance/Adherence/Medication fill history: Care Gaps: Mammogram (due 05/2022)  Star-Rating Drugs: None   Assessment/Plan   Hyperlipidemia: (LDL goal < 70) -Not ideally controlled - LDL 97 (07/2021), given aortic atherosclerosis on CT ideal LDL goal is < 70; pt denies hx of bleeding on aspirin -Current treatment: Aspirin 81 mg daily - Appropriate, Effective, Safe, Accessible -Medications previously tried: none  -Current dietary patterns: 2-3 meals per day -Current exercise habits: walks around her home -Educated on Cholesterol goals;  -Recommend to update lipid panel at next PCP appt; consider statin if LDL is > 70  Heart Failure (Goal: BP < 140/90) -Not ideally controlled - pt reports swelling in her ankles today and reports her weight is up 2 lbs for the past 2 days; she did take an extra Lasix yesterday, as instructed in recent cardiology appt; she reports the extra dose did not lead to increased urination -Last ejection fraction: 60-65% (Date: 07/2022) -HF type: HFpEF (EF > 50%) - grade II diastolic dysfunction -NYHA Class: III (marked limitation of activity) -Current home BP/HR readings: none available - BP monitor is out of batteries -Current home daily weights: 115# today (dry weight is ~113#) -Current treatment: Amlodipine 2.5 mg daily - Appropriate, Effective, Safe, Accessible Furosemide 40 mg daily + extra 1/2 tab PRN Klor-Con 20 mEq daily -Medications previously tried: bisoprolol, losartan, metoprolol -Current dietary habits: pt avoids salt, she reads nutrition labels to limit sodium -Current exercise habits: limited, pt uses rollator to get around her home -Educated on Benefits of medications for managing symptoms and prolonging life; Importance of weighing daily; if you gain more than 3 pounds in one day or 5 pounds in one week, take an extra Lasix 20 mg; Importance of blood pressure control -Recommended to continue  current medication; advised pt to call cardiology if wt is not back to dry weight by Friday 1/26  COPD/asthma (Goal: control symptoms and prevent exacerbations) -Controlled - pt uses Trelegy every day, though she does report she sometimes feels like she is not getting the full dose due to inability to take a large enough breath -Gold Grade: Gold 2 (FEV1 50-79%) -Current COPD Classification:  A (low sx, 0-1 moderate exacerbations, no hospitalizations) -Pulmonary function testing: 12/2018 - FEV1 77% pred; FEV1/FVC 0.77; FEV1 9% change -Exacerbations requiring treatment in last 6 months: 0 -Follows with pulmonology (NP [name]) -Current treatment  Albuterol HFA prn - Appropriate, Effective, Safe, Accessible Trelegy 100-62.5-25 mcg/act 1 puff daily - Appropriate, Query Effective -Medications previously tried: Librarian, academic (ineffective) -Patient reports consistent use of maintenance inhaler -Frequency of rescue inhaler use: rare -Counseled on Proper inhaler technique; Benefits of consistent maintenance inhaler use -Discussed possibility of switching to an inhaler that is not reliant on patient's breath (Respimat, HFA); she has tried Librarian, academic in the past and thinks it was not as effective as Trelegy; since breathing is under relative good control, will continue with Trelegy but may consider Breztri in future if propellant-inhaler is needed -Recommended to continue current medication  Osteoporosis (  Goal prevent fractures) -Stable - pt is compliant with Prolia injections -s/p hip fracture 07/2022 -Last DEXA Scan: 04/06/22   T-Score femoral neck: -2.5  T-Score total hip: -2.2  T-Score lumbar spine: -1.8  T-Score forearm radius: -4.0 -Patient is a candidate for pharmacologic treatment due to history of hip fracture  and T-Score < -2.5 in femoral neck -Current treatment  Prolia - started 05/2020; last dose 10/30/22 - Appropriate, Effective, Safe, Accessible Vitamin D 1000 IU daily - Appropriate,  Effective, Safe, Accessible -Medications previously tried: alendronate (~2012)  -Recommend weight-bearing and muscle strengthening exercises for building and maintaining bone density. -Discussed fall risk at length - advised to use rollator at all times at home; when standing ensure balance/stability before moving -Recommended to continue current medication  Parkinsons Disease (Goal: manage symptoms) -Controlled, stable; pt has Rx for ER version, not IR. Updated med list. -Pt reports clonazepam has helped tremendously with nightmares that she developed as a side effect of Levodopa -Follows with neurology (Dr Tat) -Current treatment  Carbidope-levodopa ER 25-100 mg TID - 9a, 1p, 5p - Appropriate, Effective, Safe, Accessible Clonazepam 0.5 mg HS - Appropriate, Effective, Safe, Accessible -Medications previously tried: n/a  -Discussed fall risk at length - medications like clonazepam, gabapentin can cause sedation, drowsiness and falls -Recommended to continue current medication  Neuropathy (Goal: manage symptoms) -Not ideally controlled - pt reports she feels like neuropathy is getting worse; she describes nerve pain on her torso began after biopsies and mastectomy, now she endorses similar burning sensation in her feet -Current treatment  Gabapentin 300 mg - 2 cap TID - Appropriate, Query Effective -Medications previously tried: none  -Discussed risks with increasing gabapentin - may not improve neuropathy and can increase risk for sedation/falls -Discussed possibility of adding duloxetine, pt would like to continue with current medication -Recommended to continue current medication; trial Capsaicin cream on feet  GERD (Goal: minimize symptoms of reflux ) -Controlled - per pt report -Current treatment  Pantoprazole 20 mg daily - Appropriate, Effective, Safe, Accessible Tums -Appropriate, Effective, Safe, Accessible -Medications previously tried: none reported  -Recommended to continue  current medication  Health Maintenance -Vaccine gaps: none -Mammogram due -Pt was taking Senna since hospitalization for hip fracture 07/2022. She recently ran out and has not been taking it. She reports bowel movements are unchanged. Advised to monitor BM closely and if she experiences more constipation, she can restart senna.   Charlene Brooke, PharmD, BCACP Clinical Pharmacist [facility] [phone]

## 2022-12-19 NOTE — Patient Instructions (Signed)
Visit Information  Phone number for Pharmacist: [phone]  Thank you for meeting with me to discuss your medications! I look forward to working with you to achieve your health care goals. Below is a summary of what we talked about during the visit:  -Continue to take your medicines as prescribed.  - Please weigh yourself every day. Take the furosemide 20 mg - 2 tablets in AM, with extra 20 mg in PM if weight is greater than 115 lbs. Call cardiology if weight is not back to normal (113 lbs) by Friday 1/26.  -Monitor your bowel movements since you are no longer taking senna. If you fee more constipated, we can restart senna.  -Monitor your blood pressure at home. Call if your BP is ever less than 100/60.   -You can try CAPSAICIN cream for nerve pain in your feet.  Charlene Brooke, PharmD, BCACP Clinical Pharmacist [facility] [place]      Charlene Brooke, PharmD, [facility] Clinical Pharmacist [facility] [phone]

## 2022-12-20 DIAGNOSIS — S72002D Fracture of unspecified part of neck of left femur, subsequent encounter for closed fracture with routine healing: Secondary | ICD-10-CM | POA: Diagnosis not present

## 2022-12-20 DIAGNOSIS — R2689 Other abnormalities of gait and mobility: Secondary | ICD-10-CM | POA: Diagnosis not present

## 2022-12-20 DIAGNOSIS — M6281 Muscle weakness (generalized): Secondary | ICD-10-CM | POA: Diagnosis not present

## 2022-12-20 DIAGNOSIS — R2681 Unsteadiness on feet: Secondary | ICD-10-CM | POA: Diagnosis not present

## 2022-12-24 DIAGNOSIS — R2689 Other abnormalities of gait and mobility: Secondary | ICD-10-CM | POA: Diagnosis not present

## 2022-12-24 DIAGNOSIS — R2681 Unsteadiness on feet: Secondary | ICD-10-CM | POA: Diagnosis not present

## 2022-12-24 DIAGNOSIS — S72002D Fracture of unspecified part of neck of left femur, subsequent encounter for closed fracture with routine healing: Secondary | ICD-10-CM | POA: Diagnosis not present

## 2022-12-24 DIAGNOSIS — M6281 Muscle weakness (generalized): Secondary | ICD-10-CM | POA: Diagnosis not present

## 2022-12-26 DIAGNOSIS — S72002D Fracture of unspecified part of neck of left femur, subsequent encounter for closed fracture with routine healing: Secondary | ICD-10-CM | POA: Diagnosis not present

## 2022-12-26 DIAGNOSIS — R2681 Unsteadiness on feet: Secondary | ICD-10-CM | POA: Diagnosis not present

## 2022-12-26 DIAGNOSIS — M6281 Muscle weakness (generalized): Secondary | ICD-10-CM | POA: Diagnosis not present

## 2022-12-26 DIAGNOSIS — R2689 Other abnormalities of gait and mobility: Secondary | ICD-10-CM | POA: Diagnosis not present

## 2022-12-28 DIAGNOSIS — R2681 Unsteadiness on feet: Secondary | ICD-10-CM | POA: Diagnosis not present

## 2022-12-28 DIAGNOSIS — R2689 Other abnormalities of gait and mobility: Secondary | ICD-10-CM | POA: Diagnosis not present

## 2022-12-28 DIAGNOSIS — M6281 Muscle weakness (generalized): Secondary | ICD-10-CM | POA: Diagnosis not present

## 2022-12-28 DIAGNOSIS — S72002D Fracture of unspecified part of neck of left femur, subsequent encounter for closed fracture with routine healing: Secondary | ICD-10-CM | POA: Diagnosis not present

## 2022-12-31 DIAGNOSIS — S72002D Fracture of unspecified part of neck of left femur, subsequent encounter for closed fracture with routine healing: Secondary | ICD-10-CM | POA: Diagnosis not present

## 2022-12-31 DIAGNOSIS — R2689 Other abnormalities of gait and mobility: Secondary | ICD-10-CM | POA: Diagnosis not present

## 2022-12-31 DIAGNOSIS — R2681 Unsteadiness on feet: Secondary | ICD-10-CM | POA: Diagnosis not present

## 2022-12-31 DIAGNOSIS — M6281 Muscle weakness (generalized): Secondary | ICD-10-CM | POA: Diagnosis not present

## 2023-01-01 ENCOUNTER — Other Ambulatory Visit: Payer: Self-pay | Admitting: Neurology

## 2023-01-01 DIAGNOSIS — G20A1 Parkinson's disease without dyskinesia, without mention of fluctuations: Secondary | ICD-10-CM

## 2023-01-02 DIAGNOSIS — R2689 Other abnormalities of gait and mobility: Secondary | ICD-10-CM | POA: Diagnosis not present

## 2023-01-02 DIAGNOSIS — S72002D Fracture of unspecified part of neck of left femur, subsequent encounter for closed fracture with routine healing: Secondary | ICD-10-CM | POA: Diagnosis not present

## 2023-01-02 DIAGNOSIS — R2681 Unsteadiness on feet: Secondary | ICD-10-CM | POA: Diagnosis not present

## 2023-01-02 DIAGNOSIS — M6281 Muscle weakness (generalized): Secondary | ICD-10-CM | POA: Diagnosis not present

## 2023-01-04 ENCOUNTER — Telehealth: Payer: Self-pay

## 2023-01-04 DIAGNOSIS — R2689 Other abnormalities of gait and mobility: Secondary | ICD-10-CM | POA: Diagnosis not present

## 2023-01-04 DIAGNOSIS — R2681 Unsteadiness on feet: Secondary | ICD-10-CM | POA: Diagnosis not present

## 2023-01-04 DIAGNOSIS — M6281 Muscle weakness (generalized): Secondary | ICD-10-CM | POA: Diagnosis not present

## 2023-01-04 DIAGNOSIS — S72002D Fracture of unspecified part of neck of left femur, subsequent encounter for closed fracture with routine healing: Secondary | ICD-10-CM | POA: Diagnosis not present

## 2023-01-04 NOTE — Progress Notes (Signed)
Care Management & Coordination Services Pharmacy Team  Reason for Encounter: Heart Failure update  Contacted patient on 01/04/2023 to discuss heart failure disease state {US HC Outreach:28874}   Current heart failure regimen: Amlodipine 2.5 mg daily - Appropriate, Effective, Safe, Accessible Furosemide 40 mg daily + extra 1/2 tab PRN Klor-Con 20 mEq daily  Have you had to take an extra dose of diuretic recently? {yes/no:20286} How often: ***  Do you weigh yourself daily or regularly? {yes/no:20286}  Date:  Weight: ***    Have any of the following symptoms worsened or changed from your baseline?  [id number]  How often are you checking your Blood Pressure? {CHL HP BP Monitoring Frequency:[phone]}  she checks her blood pressure {timing:25218} {before/after:25217} taking her medication.  DATE:   BP               PULSE ***  Wrist or arm cuff: Caffeine intake: Salt intake: OTC medications including pseudoephedrine or NSAIDs? Exercise habits:     Chart Updates: Recent office visits:  ***  Recent consult visits:  [facility] visits:  {Hospital DC Yes/No:[id number]}  Medications: Outpatient Encounter Medications as of 01/04/2023  Medication Sig Note   acetaminophen (TYLENOL) 500 MG tablet Take 1,000 mg by mouth every 12 (twelve) hours as needed.    albuterol (VENTOLIN HFA) 108 (90 Base) MCG/ACT inhaler Inhale 2 puffs into the lungs every 6 (six) hours as needed for wheezing or shortness of breath.    amLODipine (NORVASC) 2.5 MG tablet Take 1 tablet (2.5 mg total) by mouth daily.    aspirin 81 MG tablet Take 1 tablet (81 mg total) by mouth daily.    calcium carbonate (TUMS - DOSED IN MG ELEMENTAL CALCIUM) 500 MG chewable tablet Chew 1 tablet by mouth as needed for indigestion or heartburn.    Carbidopa-Levodopa ER (SINEMET CR) 25-100 MG tablet controlled release Take 1-2 tablets by mouth 3 (three) times daily. 11/21/2022: Patient takes 2 tablets ER every morning at 9  am, 1 tablet ER daily at 1 pm, and 1 tablet (ER) daily at 5 pm. (NOT currently taking Immediate release tablets) Verified by Cherrie Distance at [place] 11/21/22 at 1556.   Cholecalciferol (VITAMIN D3) 25 MCG (1000 UT) CAPS Take 1 capsule (1,000 Units total) by mouth daily.    clonazePAM (KLONOPIN) 0.5 MG tablet TAKE 1/2 A TABLET BY MOUTH AT BEDTIME    EPINEPHrine 0.3 mg/0.3 mL IJ SOAJ injection Inject 0.3 mg into the muscle as needed for anaphylaxis.    ferrous sulfate 325 (65 FE) MG tablet Take 1 tablet (325 mg total) by mouth daily with breakfast.    fexofenadine (ALLEGRA) 180 MG tablet Take 180 mg by mouth daily.    Fluticasone-Umeclidin-Vilant (TRELEGY ELLIPTA) 100-62.5-25 MCG/ACT AEPB Inhale 1 puff into the lungs daily.    furosemide (LASIX) 40 MG tablet Take 1 tablet (40 mg total) by mouth daily. May take an extra 20 mg in the afternoon for a weight gain of 3 lbs in 48 hours or 5 lbs in a week    gabapentin ([name]) 300 MG capsule TAKE 2 CAPSULES BY MOUTH 3 TIMES DAILY (Patient taking differently: Take 600 mg by mouth 3 (three) times daily. TAKE 2 CAPSULES BY MOUTH 3 TIMES DAILY)    pantoprazole (PROTONIX) 20 MG tablet Take 20 mg by mouth daily.    potassium chloride SA (KLOR-CON M) 20 MEQ tablet Take 1 tablet (20 mEq total) by mouth daily.    No facility-administered encounter medications on file  as of 01/04/2023.    Star Rating Drugs: *** Medication:  Last Fill: Day Supply  Recent Office Vitals: BP Readings from Last 3 Encounters:  12/11/22 118/80  12/07/22 132/66  11/26/22 114/66   Pulse Readings from Last 3 Encounters:  12/11/22 68  12/07/22 67  11/26/22 62    Wt Readings from Last 3 Encounters:  12/11/22 113 lb (51.3 kg)  11/22/22 118 lb 3.2 oz (53.6 kg)  09/25/22 123 lb 8 oz (56 kg)     Kidney Function Lab Results  Component Value Date/Time   CREATININE 0.79 12/07/2022 12:23 PM   CREATININE 0.57 11/25/2022 04:58 AM   CREATININE 0.8 03/28/2017 11:30 AM    CREATININE 0.9 03/28/2016 10:28 AM   GFR 70.99 12/07/2022 12:23 PM   GFRNONAA >60 11/25/2022 04:58 AM   GFRAA 66 12/23/2018 03:14 PM       Latest Ref Rng & Units 12/07/2022   12:23 PM 11/25/2022    4:58 AM 11/24/2022    4:32 AM  BMP  Glucose 70 - 99 mg/dL 87  94  90   BUN 6 - 23 mg/dL '17  11  15   '$ Creatinine 0.40 - 1.20 mg/dL 0.79  0.57  0.47   Sodium 135 - 145 mEq/L 137  139  139   Potassium 3.5 - 5.1 mEq/L 4.1  3.9  4.1   Chloride 96 - 112 mEq/L 101  101  101   CO2 19 - 32 mEq/L '28  30  31   '$ Calcium 8.4 - 10.5 mg/dL 9.4  8.3  8.2      Charlene Brooke, PharmD notified  Avel Sensor, [place] Assistant [phone]

## 2023-01-07 DIAGNOSIS — R2681 Unsteadiness on feet: Secondary | ICD-10-CM | POA: Diagnosis not present

## 2023-01-07 DIAGNOSIS — S72002D Fracture of unspecified part of neck of left femur, subsequent encounter for closed fracture with routine healing: Secondary | ICD-10-CM | POA: Diagnosis not present

## 2023-01-07 DIAGNOSIS — M6281 Muscle weakness (generalized): Secondary | ICD-10-CM | POA: Diagnosis not present

## 2023-01-07 DIAGNOSIS — R2689 Other abnormalities of gait and mobility: Secondary | ICD-10-CM | POA: Diagnosis not present

## 2023-01-09 DIAGNOSIS — R2681 Unsteadiness on feet: Secondary | ICD-10-CM | POA: Diagnosis not present

## 2023-01-09 DIAGNOSIS — S72002D Fracture of unspecified part of neck of left femur, subsequent encounter for closed fracture with routine healing: Secondary | ICD-10-CM | POA: Diagnosis not present

## 2023-01-09 DIAGNOSIS — M6281 Muscle weakness (generalized): Secondary | ICD-10-CM | POA: Diagnosis not present

## 2023-01-09 DIAGNOSIS — R2689 Other abnormalities of gait and mobility: Secondary | ICD-10-CM | POA: Diagnosis not present

## 2023-01-11 DIAGNOSIS — R2681 Unsteadiness on feet: Secondary | ICD-10-CM | POA: Diagnosis not present

## 2023-01-11 DIAGNOSIS — R2689 Other abnormalities of gait and mobility: Secondary | ICD-10-CM | POA: Diagnosis not present

## 2023-01-11 DIAGNOSIS — M6281 Muscle weakness (generalized): Secondary | ICD-10-CM | POA: Diagnosis not present

## 2023-01-11 DIAGNOSIS — S72002D Fracture of unspecified part of neck of left femur, subsequent encounter for closed fracture with routine healing: Secondary | ICD-10-CM | POA: Diagnosis not present

## 2023-01-14 DIAGNOSIS — Z96642 Presence of left artificial hip joint: Secondary | ICD-10-CM | POA: Diagnosis not present

## 2023-01-14 DIAGNOSIS — R2689 Other abnormalities of gait and mobility: Secondary | ICD-10-CM | POA: Diagnosis not present

## 2023-01-14 DIAGNOSIS — M6281 Muscle weakness (generalized): Secondary | ICD-10-CM | POA: Diagnosis not present

## 2023-01-14 DIAGNOSIS — R2681 Unsteadiness on feet: Secondary | ICD-10-CM | POA: Diagnosis not present

## 2023-01-14 DIAGNOSIS — S72002D Fracture of unspecified part of neck of left femur, subsequent encounter for closed fracture with routine healing: Secondary | ICD-10-CM | POA: Diagnosis not present

## 2023-01-16 DIAGNOSIS — M6281 Muscle weakness (generalized): Secondary | ICD-10-CM | POA: Diagnosis not present

## 2023-01-16 DIAGNOSIS — R2681 Unsteadiness on feet: Secondary | ICD-10-CM | POA: Diagnosis not present

## 2023-01-16 DIAGNOSIS — S72002D Fracture of unspecified part of neck of left femur, subsequent encounter for closed fracture with routine healing: Secondary | ICD-10-CM | POA: Diagnosis not present

## 2023-01-16 DIAGNOSIS — R2689 Other abnormalities of gait and mobility: Secondary | ICD-10-CM | POA: Diagnosis not present

## 2023-01-18 DIAGNOSIS — R2681 Unsteadiness on feet: Secondary | ICD-10-CM | POA: Diagnosis not present

## 2023-01-18 DIAGNOSIS — M6281 Muscle weakness (generalized): Secondary | ICD-10-CM | POA: Diagnosis not present

## 2023-01-18 DIAGNOSIS — S72002D Fracture of unspecified part of neck of left femur, subsequent encounter for closed fracture with routine healing: Secondary | ICD-10-CM | POA: Diagnosis not present

## 2023-01-18 DIAGNOSIS — R2689 Other abnormalities of gait and mobility: Secondary | ICD-10-CM | POA: Diagnosis not present

## 2023-01-21 DIAGNOSIS — R2681 Unsteadiness on feet: Secondary | ICD-10-CM | POA: Diagnosis not present

## 2023-01-21 DIAGNOSIS — S72002D Fracture of unspecified part of neck of left femur, subsequent encounter for closed fracture with routine healing: Secondary | ICD-10-CM | POA: Diagnosis not present

## 2023-01-21 DIAGNOSIS — M6281 Muscle weakness (generalized): Secondary | ICD-10-CM | POA: Diagnosis not present

## 2023-01-21 DIAGNOSIS — R2689 Other abnormalities of gait and mobility: Secondary | ICD-10-CM | POA: Diagnosis not present

## 2023-01-23 DIAGNOSIS — M6281 Muscle weakness (generalized): Secondary | ICD-10-CM | POA: Diagnosis not present

## 2023-01-23 DIAGNOSIS — S72002D Fracture of unspecified part of neck of left femur, subsequent encounter for closed fracture with routine healing: Secondary | ICD-10-CM | POA: Diagnosis not present

## 2023-01-23 DIAGNOSIS — R2689 Other abnormalities of gait and mobility: Secondary | ICD-10-CM | POA: Diagnosis not present

## 2023-01-23 DIAGNOSIS — R2681 Unsteadiness on feet: Secondary | ICD-10-CM | POA: Diagnosis not present

## 2023-01-28 DIAGNOSIS — S72002D Fracture of unspecified part of neck of left femur, subsequent encounter for closed fracture with routine healing: Secondary | ICD-10-CM | POA: Diagnosis not present

## 2023-01-28 DIAGNOSIS — R2689 Other abnormalities of gait and mobility: Secondary | ICD-10-CM | POA: Diagnosis not present

## 2023-01-28 DIAGNOSIS — M6281 Muscle weakness (generalized): Secondary | ICD-10-CM | POA: Diagnosis not present

## 2023-01-28 DIAGNOSIS — R2681 Unsteadiness on feet: Secondary | ICD-10-CM | POA: Diagnosis not present

## 2023-01-28 NOTE — Progress Notes (Unsigned)
Assessment/Plan:   1.  Parkinsons Disease  -Continue carbidopa/levodopa 25/100 CR, 2/1/1.  Changing from IR did not change hypersomnolence, but I did not change back because of orthostasis. 2.  EDS             -don't think that related to PD             -this is the biggest c/o.     3.  Orthostatic hypotension, with history of associated syncope             -Refuses compression binder.             -Previously prescribed Northera but did not take.    -Patient is on blood pressure medications, but does not want to change that given her worry about her thoracic aortic aneurysm.  She does not want to stop her amlodopine.  States that when she did this in the past, her blood pressure raised up too high.  Discussed the concept of permissive hypertension with her with Parkinson's disease. 4.  REM behavior disorder              -Continue clonazepam 0.5 mg, half tablet at bedtime.[name]  PDMP reviewed.  Last distribution January 01, 2023. 5.  Mild cognitive impairment             -Last neurocognitive testing was quite a long time ago in September, 2018.  I have not seen significant change since that time.  She is noting some worsening of memory and discussed repeating but she wants to hold for now 6.  Severe mitral valve stenosis  -Status post mitral valve repair in March, 2023  -Post repair, patient with recurrent severe mitral valve regurgitation per recent cardiology notes.  -Patient continues with exertional dyspnea/shortness of breath.   Subjective:   Crystal Bass was seen today in follow up for Parkinsons disease.  My previous records were reviewed prior to todays visit as well as outside records available to me.  Cardiology notes are reviewed.  Her shortness of breath continues due to recurrent severe mitral valve regurgitation.  She does not want another mitral valve clip.  She has had hospitalizations again for heart failure.  She also fractured her left hip in September after a fall.   She was reaching down and lost her balance and fell.  This did require surgical intervention, which was done August 21, 2022.  She went to SNF for rehab.  Current prescribed movement disorder medications: Carbidopa/levodopa 25/100 CR, 2 tablets in the morning, 1 in the afternoon and 1 in the evening Clonazepam 0.5 mg, half tablet at night   PREVIOUS MEDICATIONS: Sinemet (changed from IR due to EDS but not sure it really made a difference but we did not change back because of [place]); northera (RX by cardiology but not taken)  ALLERGIES:   Allergies  Allergen Reactions   Sulfa Antibiotics Anaphylaxis   Sulfonamide Derivatives Anaphylaxis   Topamax [Topiramate] Other (See Comments)    Metabolic acidosis    Clarithromycin Other (See Comments)    pericarditis Other reaction(s): Not available   Hydrocodone Other (See Comments)    "hyper and climbing the walls"   Motrin [Ibuprofen] Other (See Comments)    headaches   Misc. Sulfonamide Containing Compounds     Other reaction(s): Not available   Symbicort [Budesonide-Formoterol Fumarate] Other (See Comments)    02/07/15 tremor   Tetracycline Hcl Other (See Comments)   Percocet [Oxycodone-Acetaminophen] Itching and Rash  Tape Itching and Rash    Use paper tape only   Tetracyclines & Related Other (See Comments)    "immediate yeast infection"    CURRENT MEDICATIONS:  Outpatient Encounter Medications as of 01/29/2023  Medication Sig   acetaminophen (TYLENOL) 500 MG tablet Take 1,000 mg by mouth every 12 (twelve) hours as needed.   albuterol (VENTOLIN HFA) 108 (90 Base) MCG/ACT inhaler Inhale 2 puffs into the lungs every 6 (six) hours as needed for wheezing or shortness of breath.   amLODipine (NORVASC) 2.5 MG tablet Take 1 tablet (2.5 mg total) by mouth daily.   aspirin 81 MG tablet Take 1 tablet (81 mg total) by mouth daily.   calcium carbonate (TUMS - DOSED IN MG ELEMENTAL CALCIUM) 500 MG chewable tablet Chew 1 tablet by mouth as  needed for indigestion or heartburn.   Carbidopa-Levodopa ER (SINEMET CR) 25-100 MG tablet controlled release Take 1-2 tablets by mouth 3 (three) times daily.   Cholecalciferol (VITAMIN D3) 25 MCG (1000 UT) CAPS Take 1 capsule (1,000 Units total) by mouth daily.   clonazePAM (KLONOPIN) 0.5 MG tablet TAKE 1/2 A TABLET BY MOUTH AT BEDTIME   EPINEPHrine 0.3 mg/0.3 mL IJ SOAJ injection Inject 0.3 mg into the muscle as needed for anaphylaxis.   ferrous sulfate 325 (65 FE) MG tablet Take 1 tablet (325 mg total) by mouth daily with breakfast.   fexofenadine (ALLEGRA) 180 MG tablet Take 180 mg by mouth daily.   Fluticasone-Umeclidin-Vilant (TRELEGY ELLIPTA) 100-62.5-25 MCG/ACT AEPB Inhale 1 puff into the lungs daily.   furosemide (LASIX) 40 MG tablet Take 1 tablet (40 mg total) by mouth daily. May take an extra 20 mg in the afternoon for a weight gain of 3 lbs in 48 hours or 5 lbs in a week   gabapentin ([name]) 300 MG capsule TAKE 2 CAPSULES BY MOUTH 3 TIMES DAILY (Patient taking differently: Take 600 mg by mouth 3 (three) times daily. TAKE 2 CAPSULES BY MOUTH 3 TIMES DAILY)   pantoprazole (PROTONIX) 20 MG tablet Take 20 mg by mouth daily.   potassium chloride SA (KLOR-CON M) 20 MEQ tablet Take 1 tablet (20 mEq total) by mouth daily.   No facility-administered encounter medications on file as of 01/29/2023.    Objective:   PHYSICAL EXAMINATION:    VITALS:   There were no vitals filed for this visit.     GEN:  The patient appears stated age and is in NAD. HEENT:  Normocephalic, atraumatic.  The mucous membranes are moist. The superficial temporal arteries are without ropiness or tenderness. CV:  RRR Lungs:  CTAB.  There is some DOE Neck/HEME:  There are no carotid bruits bilaterally.  Neurological examination:  Orientation: The patient is alert and oriented x3. Cranial nerves: There is good facial symmetry with facial hypomimia. The speech is fluent and clear. Soft palate rises  symmetrically and there is no tongue deviation. Hearing is intact to conversational tone. Sensation: Sensation is intact to light touch throughout Motor: Strength is at least antigravity x4.  Movement examination: Tone: There is normal tone in the UE/LE Abnormal movements: Rare tremor of the left hand. Coordination:  There is mild decremation with finger taps on the right and heel/toe taps bilaterally Gait and Station: The patient has no difficulty arising out of a deep-seated chair without the use of the hands. The patient's stride length is good   I have reviewed and interpreted the following labs independently    Chemistry  Component Value Date/Time   NA 137 12/07/2022 1223   NA 140 04/30/2022 1249   NA 141 03/28/2017 1130   K 4.1 12/07/2022 1223   K 4.2 03/28/2017 1130   CL 101 12/07/2022 1223   CL 102 03/10/2013 1529   CO2 28 12/07/2022 1223   CO2 28 03/28/2017 1130   BUN 17 12/07/2022 1223   BUN 12 04/30/2022 1249   BUN 17.3 03/28/2017 1130   CREATININE 0.79 12/07/2022 1223   CREATININE 0.8 03/28/2017 1130      Component Value Date/Time   CALCIUM 9.4 12/07/2022 1223   CALCIUM 9.6 03/28/2017 1130   ALKPHOS 82 09/25/2022 1340   ALKPHOS 85 03/28/2017 1130   AST 13 09/25/2022 1340   AST 12 03/28/2017 1130   ALT 3 09/25/2022 1340   ALT <6 03/28/2017 1130   BILITOT 1.1 09/25/2022 1340   BILITOT 1.38 (H) 03/28/2017 1130       Lab Results  Component Value Date   WBC 5.1 11/25/2022   HGB 12.3 11/25/2022   HCT 39.3 11/25/2022   MCV 95.6 11/25/2022   PLT 152 11/25/2022    Lab Results  Component Value Date   TSH 0.84 03/23/2022     Total time spent on today's visit was *** minutes, including both face-to-face time and nonface-to-face time.  Time included that spent on review of records (prior notes available to me/labs/imaging if pertinent), discussing treatment and goals, answering patient's questions and coordinating care.  Cc:  [name], MD

## 2023-01-29 ENCOUNTER — Encounter: Payer: Self-pay | Admitting: Neurology

## 2023-01-29 ENCOUNTER — Ambulatory Visit: Payer: Medicare PPO | Admitting: Neurology

## 2023-01-29 ENCOUNTER — Other Ambulatory Visit (INDEPENDENT_AMBULATORY_CARE_PROVIDER_SITE_OTHER): Payer: Medicare PPO

## 2023-01-29 VITALS — BP 114/62 | HR 50 | Ht 62.0 in | Wt 113.0 lb

## 2023-01-29 DIAGNOSIS — H532 Diplopia: Secondary | ICD-10-CM

## 2023-01-29 DIAGNOSIS — G20A1 Parkinson's disease without dyskinesia, without mention of fluctuations: Secondary | ICD-10-CM

## 2023-01-29 NOTE — Patient Instructions (Signed)
Your provider has requested that you have labwork completed today. The lab is located on the Second floor at [place], within the [facility] office. When you get off the elevator, turn right and go in the [facility] [address]; the first brown door on the left.  Tell the ladies behind the desk that you are there for lab work. If you are not called within 15 minutes please check with the front desk.   Once you complete your labs you are free to go. You will receive a call or message via MyChart with your lab results.    The physicians and staff at [facility] are committed to providing excellent care. You may receive a survey requesting feedback about your experience at our office. We strive to receive "very good" responses to the survey questions. If you feel that your experience would prevent you from giving the office a "very good " response, please contact our office to try to remedy the situation. We may be reached at [phone]. Thank you for taking the time out of your busy day to complete the survey.

## 2023-01-30 ENCOUNTER — Other Ambulatory Visit: Payer: Self-pay | Admitting: Cardiology

## 2023-01-30 DIAGNOSIS — M6281 Muscle weakness (generalized): Secondary | ICD-10-CM | POA: Diagnosis not present

## 2023-01-30 DIAGNOSIS — R2681 Unsteadiness on feet: Secondary | ICD-10-CM | POA: Diagnosis not present

## 2023-01-30 DIAGNOSIS — R2689 Other abnormalities of gait and mobility: Secondary | ICD-10-CM | POA: Diagnosis not present

## 2023-01-30 DIAGNOSIS — S72002D Fracture of unspecified part of neck of left femur, subsequent encounter for closed fracture with routine healing: Secondary | ICD-10-CM | POA: Diagnosis not present

## 2023-02-01 DIAGNOSIS — R2681 Unsteadiness on feet: Secondary | ICD-10-CM | POA: Diagnosis not present

## 2023-02-01 DIAGNOSIS — M6281 Muscle weakness (generalized): Secondary | ICD-10-CM | POA: Diagnosis not present

## 2023-02-01 DIAGNOSIS — S72002D Fracture of unspecified part of neck of left femur, subsequent encounter for closed fracture with routine healing: Secondary | ICD-10-CM | POA: Diagnosis not present

## 2023-02-01 DIAGNOSIS — R2689 Other abnormalities of gait and mobility: Secondary | ICD-10-CM | POA: Diagnosis not present

## 2023-02-04 ENCOUNTER — Other Ambulatory Visit: Payer: Self-pay | Admitting: Neurology

## 2023-02-04 DIAGNOSIS — G20A1 Parkinson's disease without dyskinesia, without mention of fluctuations: Secondary | ICD-10-CM

## 2023-02-05 ENCOUNTER — Other Ambulatory Visit: Payer: Self-pay | Admitting: Neurology

## 2023-02-05 DIAGNOSIS — G20A1 Parkinson's disease without dyskinesia, without mention of fluctuations: Secondary | ICD-10-CM

## 2023-02-06 ENCOUNTER — Other Ambulatory Visit: Payer: Self-pay | Admitting: Family Medicine

## 2023-02-06 ENCOUNTER — Other Ambulatory Visit: Payer: Self-pay | Admitting: Neurology

## 2023-02-06 DIAGNOSIS — H532 Diplopia: Secondary | ICD-10-CM

## 2023-02-06 DIAGNOSIS — G20A1 Parkinson's disease without dyskinesia, without mention of fluctuations: Secondary | ICD-10-CM

## 2023-02-06 NOTE — Telephone Encounter (Signed)
Ferrous sulfate last filled:  10/08/22, #90 Gabapentin last filled:  01/09/23, #540 Last OV:  12/07/22, hosp f/u Next OV:  none

## 2023-02-07 ENCOUNTER — Telehealth: Payer: Self-pay

## 2023-02-07 NOTE — Telephone Encounter (Signed)
ERx 

## 2023-02-07 NOTE — Telephone Encounter (Signed)
Received faxed [place] form from Chrystie Nose of [facility] to be completed. Needs to be faxed back to [phone]; attn:  Chrystie Nose.   Placed form in Dr. Synthia Innocent box.

## 2023-02-08 DIAGNOSIS — E8770 Fluid overload, unspecified: Secondary | ICD-10-CM

## 2023-02-08 DIAGNOSIS — R0902 Hypoxemia: Secondary | ICD-10-CM

## 2023-02-10 LAB — MYASTHENIA GRAVIS PANEL 1
A CHR BINDING ABS: <0.3 nmol/L
STRIATED MUSCLE AB SCREEN: NEGATIVE

## 2023-02-11 ENCOUNTER — Ambulatory Visit (INDEPENDENT_AMBULATORY_CARE_PROVIDER_SITE_OTHER): Payer: Medicare PPO | Admitting: Cardiology

## 2023-02-11 ENCOUNTER — Ambulatory Visit: Payer: Medicare PPO | Attending: Internal Medicine

## 2023-02-11 ENCOUNTER — Ambulatory Visit: Payer: Medicare PPO

## 2023-02-11 ENCOUNTER — Other Ambulatory Visit (HOSPITAL_COMMUNITY): Payer: Medicare PPO

## 2023-02-11 VITALS — BP 120/70 | HR 72 | Ht 62.0 in | Wt 113.0 lb

## 2023-02-11 DIAGNOSIS — G20A1 Parkinson's disease without dyskinesia, without mention of fluctuations: Secondary | ICD-10-CM | POA: Insufficient documentation

## 2023-02-11 DIAGNOSIS — I340 Nonrheumatic mitral (valve) insufficiency: Secondary | ICD-10-CM | POA: Insufficient documentation

## 2023-02-11 DIAGNOSIS — A310 Pulmonary mycobacterial infection: Secondary | ICD-10-CM

## 2023-02-11 DIAGNOSIS — Z9889 Other specified postprocedural states: Secondary | ICD-10-CM

## 2023-02-11 DIAGNOSIS — Z95818 Presence of other cardiac implants and grafts: Secondary | ICD-10-CM

## 2023-02-11 LAB — ECHOCARDIOGRAM COMPLETE
Area-P 1/2: 2.28 cm2
MV VTI: 0.93 cm2
S' Lateral: 2.9 cm

## 2023-02-11 NOTE — Telephone Encounter (Signed)
Faxed form.

## 2023-02-11 NOTE — Telephone Encounter (Addendum)
Filled and in Lisa's box.  

## 2023-02-11 NOTE — Patient Instructions (Signed)
Medication Instructions:  Your physician recommends that you continue on your current medications as directed. Please refer to the Current Medication list given to you today.  *If you need a refill on your cardiac medications before your next appointment, please call your pharmacy*   Lab Work: NONE If you have labs (blood work) drawn today and your tests are completely normal, you will receive your results only by: MyChart Message (if you have MyChart) OR A paper copy in the mail If you have any lab test that is abnormal or we need to change your treatment, we will call you to review the results.   Testing/Procedures: NONE   Follow-Up: At [facility], you and your health needs are our priority.  As part of our continuing mission to provide you with exceptional heart care, we have created designated Provider Care Teams.  These Care Teams include your primary Cardiologist (physician) and Advanced Practice Providers (APPs -  Physician Assistants and Nurse Practitioners) who all work together to provide you with the care you need, when you need it.  We recommend signing up for the patient portal called "MyChart".  Sign up information is provided on this After Visit Summary.  MyChart is used to connect with patients for Virtual Visits (Telemedicine).  Patients are able to view lab/test results, encounter notes, upcoming appointments, etc.  Non-urgent messages can be sent to your provider as well.   To learn more about what you can do with MyChart, go to [email or web address].    Your next appointment:   KEEP SCHEDULED FOLLOW-UP 

## 2023-02-12 NOTE — Progress Notes (Signed)
HE[facility] AND [place]                                     Cardiology Office Note:    Date:  02/12/2023   ID:  Crystal Bass, DOB April 30, 1941, MRN [id number]  PCP:  [name], MD  [facility] Cardiologist:  [name], MD/ Dr. Burt Knack, MD (TEER)  [facility] Electrophysiologist:  None   Referring MD: [name], MD   Chief Complaint  Patient presents with   Follow-up    1 year s/p TEER   History of Present Illness:    Crystal Bass is a 82 y.o. adult with a hx of pulmonary embolus 2019 Rx'd with Eliquis x 3 months, Parkinson's disease complicated by orthostatic hypotension, [name] infection with chronic lung disease, remote mastectomy for breast cancer, and severe mitral regurgitation s/p mTEER 02/15/22 who presents to clinic for one year follow up.   Crystal Bass lives independently at [facility] retirement facility in [place]. She was very functional at baseline; however, was referred to Dr. Burt Knack after experiencing worsening fatigue and exertional dyspnea x 6 months and diagnosed with severe mitral regurgitation by TTE and cath. [facility] 11/14/21 showed no CAD and severe mitral regurgitation with pulmonary vein flow reversal and large V waves.  Subsequent TEE 12/27/21 showed severe mitral regurgitation with marked prolapse of the A2 scallop of the anterior mitral leaflet and severe associated mitral regurgitation (Barlow valve).  She did not have any clear evidence of a flail leaflet.    The patient was evaluated by the multidisciplinary valve team and underwent successful mTEER using 2 Mitraclip XTW devices, reducing MR from 4+ to 1+, both Clips positioned A2/P2. Intraoperative echo showed mild residual grade 1 MR at end of case with good tissue bridge on 3D imaging Mean gradient 5 peak 8 mmHg across MV at HR of 64 bpm. POD1 echo showed EF 60% with a slight increase in MR to mild to moderate. Plan was for ASA and Plavix  for at least 3 months (05/18/22) then monotherapy with aspirin alone.    She was seen in [facility] follow up after TEER with significant dyspnea on exertion with concern for possible clip detachment. Her murmur was much more pronounced although with no other overt HF symptoms. Dr. Burt Knack saw her and agreed with the need for an urgent echocardiogram, performed 02/27/22. This showed stable clip placement with mild to moderate MR. In follow up, she continued to have SOB with minimal exertion although was walking 1/2 mile, twice daily for exercise. She did have complaints of brief palpitations at times and was set up to wear a ZIO. This showed two short episodes of VT and occasional PVCs but otherwise was stable. She was working with cardiopulmonary rehab and was seen by pulmonology and felt to be stable from their standpoint. One month echocardiogram s/p mTEER showed EF 65-70%, s/p MtriaClip with 2 XTW clips (A2/P2 and A3/P3) with moderate eccentric MR directed posteriorly along wall of LV.   In follow up she continued to feel terrible. She was started on low dise Lasix and her case was discussed at our valve team meeting. Repeat TEE performed 6/19 showed myxomatous mitral valve disease with severe anterior prolapse/flail. She followed with Dr. Burt Knack thereafter and plans were to proceed with repeat clip with concern of possible MS with adding a third clip. Unfortunately, she  called and canceled the procedure.   She has followed with her cardiology team several times since then including a brief hospitalization for CHF at which time she was treated with Lasix with improved volume status.   She comes today for her one year TEER follow up alone. She is very tearful about her situation and is asking about other options to help her quality of life. She was previously very independent however has found that IADLs have become overwhelming therefore she is in the process of moving from independent to assisted living at [facility]. She needs extra assistance with her meals and can no longer grocery shop and cook on her own due to her symptoms. This is very frustrating to her. She asks about surgery options however long discussion about surgical risks given her age. She understands this is not an option. Her volume has been very stable with the addition of Lasix with no recent weight changes or LE edema. She has no chest pain, palpitations, orthopnea, dizziness, bleeding, or syncope.    Past Medical History:  Diagnosis Date   Aneurysm of aorta ([place])    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   Anginal pain ([place])    a. 10/2021 Cath: Nl cors.   Aortic root aneurysm (HCC)    Arthritis    Asthma    Breast cancer ([place]) 2002   infiltrative ductal carcinoma    Cataract 2019   Chronic heart failure with preserved ejection fraction (HFpEF) (HCC)    COPD (chronic obstructive pulmonary disease) ([place]) 07/2007   Ductal carcinoma of breast, estrogen receptor positive, stage 1 ([place]) 09/16/2012   Endometriosis    GERD (gastroesophageal reflux disease)    Hypercalcemia 09/14/2013   Hypertension    Lesion of breast 1992   right, benign   Lung disease    secondary to [name] infection   Mastalgia 05/1994   Mitral regurgitation    a. 10/2021 RHC: PCWP V wave of 34; b. 01/2022 TEE: Severe flail/prolapse of large area of A2 scallop with severe posteriorly directed MR; c. 01/2022 TEER w /Mitraclip XTW x 2; d. 07/2022 Echo: EF 60-65%. No rwma, mild LVH, GrIII DD. Sev dil LA. Mod elev PASP. Mod-Sev MR. Mod MS.   Osteoporosis, post-menopausal 03/14/2012   Ovarian tumor of borderline malignancy, right 2004   Palpitations    a. 02/2022 Zio: Predominantly sinus rhythm at 67 (52-171).  2 runs of NSVT -fastest 9 beats at 167.  35 SVT runs-longest 19 beats, fastest 171x6 beats.   Pancreatitis    secondary to Cholelithiasis   Parkinson disease 02/10/2015   Post-thoracotomy pain syndrome    Pseudomonas pneumonia ([place]) 03/2008   Status post  implantation of mitral valve leaflet clip 02/15/2022   s/p XTW MitraClip x2 by Dr. Burt Knack in the A2/P2 position   Traumatic closed fracture of distal clavicle with minimal displacement, left, initial encounter 06/2021   [facility]   Uterine fibroid    Vitamin D deficiency     Past Surgical History:  Procedure Laterality Date   ABDOMINAL HYSTERECTOMY     & BSO for Mucinous borderline tumor of R ovary 2004   APPENDECTOMY  2004   BREAST BIOPSY  08/2004   right breast-benign   BREAST LUMPECTOMY  1992   benign   BUBBLE STUDY  12/27/2021   Procedure: BUBBLE STUDY;  Surgeon: Geralynn Rile, MD;  Location: [place];  Service: Cardiovascular;;   CARDIAC CATHETERIZATION  2007   essentially negation for  significant CAD   CHOLECYSTECTOMY  2001   COLONOSCOPY  2014   Dr [name] , due 2019   COLONOSCOPY  09/2018   2 TA removed, int hem, no f/u needed (Dr [name])    ESOPHAGOGASTRODUODENOSCOPY  09/2018   GERD with esophagitis and stricture dilated, antral gastritis negative for H pylori [name])   HIP ARTHROPLASTY Left 08/21/2022   Procedure: ARTHROPLASTY BIPOLAR HIP (HEMIARTHROPLASTY);  Surgeon: Thornton Park, MD;  Location: [facility];  Service: Orthopedics;  Laterality: Left;   LUNG BIOPSY  2002   [name], Dr [name]   MASTECTOMY MODIFIED RADICAL Left 2002   oral chemotheraphy (tamoxifen then Armidex) no radiation, Dr.[name]   MITRAL VALVE REPAIR N/A 02/15/2022   Procedure: MITRAL VALVE REPAIR;  Surgeon: Sherren Mocha, MD;  Location: [place] [facility];  Service: Cardiovascular;  Laterality: N/A;   RIGHT/LEFT HEART CATH AND CORONARY ANGIOGRAPHY Bilateral 11/14/2021   Procedure: RIGHT/LEFT HEART CATH AND CORONARY ANGIOGRAPHY;  Surgeon: Wellington Hampshire, MD;  Location: [place] [facility];  Service: Cardiovascular;  Laterality: Bilateral;   TEE WITHOUT CARDIOVERSION N/A 12/29/2018   Procedure: TRANSESOPHAGEAL ECHOCARDIOGRAM (TEE);  Surgeon: Wellington Hampshire, MD;  Location: [facility];   Service: Cardiovascular;  Laterality: N/A;   TEE WITHOUT CARDIOVERSION N/A 12/27/2021   Procedure: TRANSESOPHAGEAL ECHOCARDIOGRAM (TEE);  Surgeon: Geralynn Rile, MD;  Location: [place];  Service: Cardiovascular;  Laterality: N/A;   TEE WITHOUT CARDIOVERSION N/A 02/15/2022   Procedure: TRANSESOPHAGEAL ECHOCARDIOGRAM (TEE);  Surgeon: Sherren Mocha, MD;  Location: [place] [facility];  Service: Cardiovascular;  Laterality: N/A;   TEE WITHOUT CARDIOVERSION N/A 05/14/2022   Procedure: TRANSESOPHAGEAL ECHOCARDIOGRAM (TEE);  Surgeon: Geralynn Rile, MD;  Location: [place];  Service: Cardiovascular;  Laterality: N/A;   TONSILLECTOMY  1946    Current Medications: Current Meds  Medication Sig   acetaminophen (TYLENOL) 500 MG tablet Take 1,000 mg by mouth every 12 (twelve) hours as needed.   albuterol (VENTOLIN HFA) 108 (90 Base) MCG/ACT inhaler Inhale 2 puffs into the lungs every 6 (six) hours as needed for wheezing or shortness of breath.   amLODipine (NORVASC) 2.5 MG tablet Take 1 tablet (2.5 mg total) by mouth daily.   aspirin 81 MG tablet Take 1 tablet (81 mg total) by mouth daily.   calcium carbonate (TUMS - DOSED IN MG ELEMENTAL CALCIUM) 500 MG chewable tablet Chew 1 tablet by mouth as needed for indigestion or heartburn.   Carbidopa-Levodopa ER (SINEMET CR) 25-100 MG tablet controlled release TAKE TWO TABLET BY MOUTH AT NINE IN THE MORNING, ONE TAB AT ONE IN THE EVENING AND ONE TAB AT FIVE IN THE EVENING   Cholecalciferol (VITAMIN D3) 25 MCG (1000 UT) CAPS Take 1 capsule (1,000 Units total) by mouth daily.   clonazePAM (KLONOPIN) 0.5 MG tablet TAKE ONE HALF (1/2) A TABLET BY MOUTH AT BEDTIME   EPINEPHrine 0.3 mg/0.3 mL IJ SOAJ injection Inject 0.3 mg into the muscle as needed for anaphylaxis.   FEROSUL 325 (65 Fe) MG tablet TAKE ONE TABLET BY MOUTH DAILY WITH BREAKFAST   fexofenadine (ALLEGRA) 180 MG tablet Take 180 mg by mouth daily.   Fluticasone-Umeclidin-Vilant (TRELEGY  ELLIPTA) 100-62.5-25 MCG/ACT AEPB Inhale 1 puff into the lungs daily.   furosemide (LASIX) 40 MG tablet Take 1 tablet (40 mg total) by mouth daily. May take an extra 20 mg in the afternoon for a weight gain of 3 lbs in 48 hours or 5 lbs in a week   gabapentin ([name]) 300 MG capsule  TAKE TWO CAPSULES BY MOUTH THREE TIMES DAILY   pantoprazole (PROTONIX) 20 MG tablet Take 20 mg by mouth daily.   potassium chloride SA (KLOR-CON M) 20 MEQ tablet TAKE ONE TABLET BY MOUTH ONCE A DAY     Allergies:   Sulfa antibiotics, Sulfonamide derivatives, Topamax [topiramate], Clarithromycin, Hydrocodone, Motrin [ibuprofen], Misc. 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  Tobacco Use   Smoking status: Never   Smokeless tobacco: Never  Vaping Use   Vaping Use: Never used  Substance and Sexual Activity   Alcohol use: No    Alcohol/week: 0.0 standard drinks of alcohol   Drug use: No   Sexual activity: Not Currently    Birth control/protection: Surgical    Comment: TAH/BSO  Other Topics Concern   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    Aunt of Dr Margaretmary Eddy    Lives independent living at [facility]    No children    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, 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, 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, 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)    Activity: walking about 1 mile/day, enjoys [employer]    Diet: some water, fruits/vegetables some    Right handed    Social Determinants of Health   Financial Resource Strain: Low Risk  (08/13/2022)   Overall Financial Resource Strain (CARDIA)    Difficulty of Paying Living Expenses: Not  hard at all  Food Insecurity: No Food Insecurity (11/28/2022)   Hunger Vital Sign    Worried About Running Out of Food in the Last Year: Never true    [place] in the Last Year: Never true  Transportation Needs: No Transportation Needs (11/28/2022)   PRAPARE - Hydrologist (Medical): No    Lack of Transportation (Non-Medical): No  Recent Concern: Transportation Needs - Unmet Transportation Needs (11/22/2022)   PRAPARE - Transportation    Lack of Transportation (Medical): No    Lack of Transportation (Non-Medical): Yes  Physical Activity: Insufficiently Active (08/13/2022)   Exercise Vital Sign    Days of Exercise per Week: 4 days    Minutes of Exercise per Session: 30 min  Stress: No Stress Concern Present (08/13/2022)   [name]    Feeling of Stress : Not at all  Social Connections: Socially Isolated (08/13/2022)   Social Connection and Isolation Panel [NHANES]    Frequency of Communication with Friends and Family: More than three times a week    Frequency of Social Gatherings with Friends and Family: Once a week    Attends Religious Services: Never    Marine scientist or Organizations: No    Attends Archivist Meetings: Never    Marital Status: Widowed     Family History: The patient's family history includes Breast cancer in her cousin; Breast cancer (age of onset: 10) in her sister; Cancer in her maternal grandfather, maternal grandmother, maternal uncle, maternal uncle, and maternal uncle; Colon cancer in her sister; Colon cancer (age of onset: 25) in her cousin; Emphysema in her mother; Liver cancer in her maternal aunt; Lung  cancer (age of onset: 38) in her cousin; Lung cancer (age of onset: 15) in her father; Melanoma in her maternal aunt; Myasthenia gravis in her paternal aunt; Osteoporosis in her paternal aunt; Parkinson's disease in her cousin; Stroke in her  paternal aunt. There is no history of Diabetes, Heart disease, Stomach cancer, or Ulcerative colitis.  ROS:   Please see the history of present illness.    All other systems reviewed and are negative.  EKGs/Labs/Other Studies Reviewed:    The following studies were reviewed today:  ZIO 04/12/22:   Patch Wear Time:  13 days and 19 hours (2023-04-27T14:23:18-0400 to 2023-05-11T09:37:39-0400)   Patient had a min HR of 52 bpm, max HR of 171 bpm, and avg HR of 67 bpm.  Predominant underlying rhythm was Sinus Rhythm. 2 Ventricular Tachycardia runs occurred, the run with the fastest interval lasting 9 beats with a max rate of 167 bpm (avg 146 bpm); the run with the fastest interval was also the longest.  35 Supraventricular Tachycardia runs occurred, the run with the fastest interval lasting 6 beats with a max rate of 171 bpm, the longest lasting 19 beats with an avg rate of 109 bpm. Occasional PVCs with a burden of 1.7%.   Echocardiogram 03/19/22:   Left ventricular ejection fraction, by estimation, is 65 to 70%. The left ventricle has normal function. The left ventricle has no regional wall motion abnormalities. There is mild left ventricular hypertrophy. Left ventricular diastolic parameters are consistent with Grade II diastolic dysfunction (pseudonormalization). Elevated left atrial pressure. 1. Right ventricular systolic function is normal. The right ventricular size is normal. There is normal pulmonary artery systolic pressure. 2. 3. Left atrial size was severely dilated. 4. PFO is present as seen by color doppler wiht L to R flow. . present. S/p MtriaClip with 2 XTW clipis (A2/P2 and A3/P3) MR is very eccentric directed posteriorly along wall of LV It is probably moderate in severity. Compared to echo from February 16, 2022, MR appears more prominent . The mitral valve has been repaired/replaced. There is moderate prolapse of both leaflets of the mitral valve. Procedure Date:  02/15/2022. 5. The aortic valve is tricuspid. Aortic valve regurgitation is not visualized. Aortic valve sclerosis is present, with no evidence of aortic valve stenosis. 6. The inferior vena cava is normal in size with greater than 50% respiratory variability, suggesting right atrial pressure of 3 mmHg.       Date of Procedure:                02/15/2022   Preoperative Diagnosis:      Severe Symptomatic Mitral Regurgitation (Stage D)   Postoperative Diagnosis:    Same    Procedure Performed: Ultrasound-guided right transfemoral venous access Double PreClose right femoral vein Transseptal puncture using Bailess RF needle Mitral valve repair with MitraClip XTW x 2   Surgeon: Blane Ohara, MD  Cosurgeon: Lenna Sciara, MD   Echocardiographer: Dr Johnsie Cancel and Dr Gasper Sells   Anesthesiologist: Rochele Pages, MD   Device Implant: Mitraclip XTW, 2 devices   Procedural Indication: Severe Non-rheumatic Mitral Regurgitation (Stage D)  ___________  EKG:  EKG is not ordered today.    Recent Labs: 03/23/2022: TSH 0.84 04/30/2022: NT-Pro BNP 403 09/25/2022: ALT 3 11/25/2022: B Natriuretic Peptide 247.6; Hemoglobin 12.3; Platelets 152 12/07/2022: BUN 17; Creatinine, Ser 0.79; Magnesium 2.2; Potassium 4.1; Sodium 137  Recent Lipid Panel    Component Value Date/Time   CHOL 171 08/08/2021 1320   TRIG 100.0 08/08/2021  1320   HDL 53.30 08/08/2021 1320   CHOLHDL 3 08/08/2021 1320   VLDL 20.0 08/08/2021 1320   LDLCALC 97 08/08/2021 1320   LDLDIRECT 135.0 08/16/2009 1359   Physical Exam:    VS:  BP 120/70   Pulse 72   Ht 5\' 2"  (1.575 m)   Wt 113 lb (51.3 kg)   SpO2 95%   BMI 20.67 kg/m     Wt Readings from Last 3 Encounters:  02/11/23 113 lb (51.3 kg)  01/29/23 113 lb (51.3 kg)  12/11/22 113 lb (51.3 kg)    General: Elderly, NAD Neck: Negative for carotid bruits. No JVD Lungs:Clear to ausculation bilaterally. No wheezes, rales, or rhonchi. Breathing is  unlabored. Cardiovascular: RRR with S1 S2. Very loud mitral murmur with radiation. Extremities: No edema.  Neuro: Alert and oriented. No focal deficits. No facial asymmetry. MAE spontaneously. Psych: Responds to questions appropriately with normal affect.    ASSESSMENT/PLAN:    Severe mitral regurgitation: s/p TEER using 2 Mitraclip XTW devices, reducing MR from 4+ to 1+, both Clips positioned A2/P2. Post operative echo reviewed by Dr. Burt Knack which showed stable clip position and mild residual MR. Echocardiogram from 07/2022 with moderate MR with a mean gradient at 4.55mmHg. Given persistent MR, repeat TEE showed anatomy suitable for third clip and she was scheduled for TEER. Unfortunately she cancelled this procedure. Continues to have NYHA class III/IV symptoms today with echo showing stable clip position but severe/torrential MR lateral to the clips with myxomatous leaflet prolapse. Case discussed at valve meeting 3/19 with plans to have her see Dr. Burt Knack once again as a last option. In the interm, will upload TEE imagine to [facility] for review prior to appointment. Spoke with patient to confirm. Continue ASA, dental SBE with Amoxicillin 2g.   Chronic diastolic CHF: Recently hospitalized with worsening HF symptoms treated with Lasix. Appears volume stable today with current Lasix dosing. Has not needed PRN. Weight stable and no LE   Parkinson's disease complicated by orthostatic hypotension: No recent dizziness. BP stable today with no changes needed at this time.    [name] infection with chronic lung disease with hypoxia: Seen prior to TEER by pulmonary and felt to be high risk to proceed with procedure. She has had no real change in symptoms post clip implantation. Continue Trelegy, PRN albuterol.      Medication Adjustments/Labs and Tests Ordered: Current medicines are reviewed at length with the patient today.  Concerns regarding medicines are outlined above.  No orders of the defined types were  placed in this encounter.  No orders of the defined types were placed in this encounter.   Patient Instructions  Medication Instructions:  Your physician recommends that you continue on your current medications as directed. Please refer to the Current Medication list given to you today.  *If you need a refill on your cardiac medications before your next appointment, please call your pharmacy*   Lab Work: NONE If you have labs (blood work) drawn today and your tests are completely normal, you will receive your results only by: [place] (if you have MyChart) OR A paper copy in the mail If you have any lab test that is abnormal or we need to change your treatment, we will call you to review the results.   Testing/Procedures: NONE   Follow-Up: At [facility], you and your health needs are our priority.  As part of our continuing mission to provide you with exceptional heart care, we have created  designated Provider Care Teams.  These Care Teams include your primary Cardiologist (physician) and Advanced Practice Providers (APPs -  Physician Assistants and Nurse Practitioners) who all work together to provide you with the care you need, when you need it.  We recommend signing up for the patient portal called "MyChart".  Sign up information is provided on this After Visit Summary.  MyChart is used to connect with patients for Virtual Visits (Telemedicine).  Patients are able to view lab/test results, encounter notes, upcoming appointments, etc.  Non-urgent messages can be sent to your provider as well.   To learn more about what you can do with MyChart, go to [email or web address].    Your next appointment:   KEEP SCHEDULED FOLLOW-UP    Signed, Kathyrn Drown, NP  02/12/2023 9:51 AM    [place]

## 2023-02-14 ENCOUNTER — Telehealth: Payer: Self-pay

## 2023-02-15 DIAGNOSIS — S72002D Fracture of unspecified part of neck of left femur, subsequent encounter for closed fracture with routine healing: Secondary | ICD-10-CM | POA: Diagnosis not present

## 2023-02-15 DIAGNOSIS — R2681 Unsteadiness on feet: Secondary | ICD-10-CM | POA: Diagnosis not present

## 2023-02-15 DIAGNOSIS — R2689 Other abnormalities of gait and mobility: Secondary | ICD-10-CM | POA: Diagnosis not present

## 2023-02-15 DIAGNOSIS — M6281 Muscle weakness (generalized): Secondary | ICD-10-CM | POA: Diagnosis not present

## 2023-02-18 ENCOUNTER — Telehealth: Payer: Self-pay | Admitting: *Deleted

## 2023-02-18 DIAGNOSIS — M6281 Muscle weakness (generalized): Secondary | ICD-10-CM | POA: Diagnosis not present

## 2023-02-18 DIAGNOSIS — R2681 Unsteadiness on feet: Secondary | ICD-10-CM | POA: Diagnosis not present

## 2023-02-18 DIAGNOSIS — R2689 Other abnormalities of gait and mobility: Secondary | ICD-10-CM | POA: Diagnosis not present

## 2023-02-18 DIAGNOSIS — S72002D Fracture of unspecified part of neck of left femur, subsequent encounter for closed fracture with routine healing: Secondary | ICD-10-CM | POA: Diagnosis not present

## 2023-02-18 NOTE — Progress Notes (Signed)
  Care Coordination  Outreach Note  02/18/2023 Name: Crystal Bass MRN: [id number] DOB: 1941/10/23   Care Coordination Outreach Attempts: An unsuccessful telephone outreach was attempted today to offer the patient information about available care coordination services as a benefit of their health plan.   Follow Up Plan:  Additional outreach attempts will be made to offer the patient care coordination information and services.   Encounter Outcome:  Pt. Request to Call [place], [place] Direct Dial: [phone]

## 2023-02-20 DIAGNOSIS — S72002D Fracture of unspecified part of neck of left femur, subsequent encounter for closed fracture with routine healing: Secondary | ICD-10-CM | POA: Diagnosis not present

## 2023-02-20 DIAGNOSIS — M6281 Muscle weakness (generalized): Secondary | ICD-10-CM | POA: Diagnosis not present

## 2023-02-20 DIAGNOSIS — R2689 Other abnormalities of gait and mobility: Secondary | ICD-10-CM | POA: Diagnosis not present

## 2023-02-20 DIAGNOSIS — R2681 Unsteadiness on feet: Secondary | ICD-10-CM | POA: Diagnosis not present

## 2023-02-20 NOTE — Progress Notes (Signed)
  Care Coordination  Outreach Note  02/20/2023 Name: Crystal Bass MRN: [id number] DOB: 1940-11-27   Care Coordination Outreach Attempts: A second unsuccessful outreach was attempted today to offer the patient with information about available care coordination services as a benefit of their health plan.     Follow Up Plan:  Additional outreach attempts will be made to offer the patient care coordination information and services.   Encounter Outcome:  Pt. Request to Call [place], [place] Direct Dial: [phone]

## 2023-02-22 DIAGNOSIS — R2689 Other abnormalities of gait and mobility: Secondary | ICD-10-CM | POA: Diagnosis not present

## 2023-02-22 DIAGNOSIS — S72002D Fracture of unspecified part of neck of left femur, subsequent encounter for closed fracture with routine healing: Secondary | ICD-10-CM | POA: Diagnosis not present

## 2023-02-22 DIAGNOSIS — R2681 Unsteadiness on feet: Secondary | ICD-10-CM | POA: Diagnosis not present

## 2023-02-22 DIAGNOSIS — M6281 Muscle weakness (generalized): Secondary | ICD-10-CM | POA: Diagnosis not present

## 2023-02-22 NOTE — Progress Notes (Signed)
  Care Coordination  Outreach Note  02/22/2023 Name: Crystal Bass MRN: [id number] DOB: 1941/01/21   Care Coordination Outreach Attempts: A third unsuccessful outreach was attempted today to offer the patient with information about available care coordination services as a benefit of their health plan.   Follow Up Plan:  No further outreach attempts will be made at this time. We have been unable to contact the patient to offer or enroll patient in care coordination services  Encounter Outcome:  No Answer  Julian Hy, [place] Direct Dial: [phone]

## 2023-02-25 NOTE — Progress Notes (Signed)
  Care Coordination   Note   02/25/2023 Name: ESTEFANY JANTZ MRN: [id number] DOB: 1941/02/02  CARETTA TWEDT is a 82 y.o. year old adult who sees [name], MD for primary care. I reached out to Patsey Berthold by phone today to offer care coordination services.  Ms. Reitmeier was given information about Care Coordination services today including:   The Care Coordination services include support from the care team which includes your Nurse Coordinator, Clinical Social Worker, or Pharmacist.  The Care Coordination team is here to help remove barriers to the health concerns and goals most important to you. Care Coordination services are voluntary, and the patient may decline or stop services at any time by request to their care team member.   Care Coordination Consent Status: Patient agreed to services and verbal consent obtained.   Follow up plan:  Telephone appointment with care coordination team member scheduled for:  02/26/2023  Encounter Outcome:  Pt. Scheduled  Julian Hy, [place] Direct Dial: [phone]

## 2023-02-26 ENCOUNTER — Ambulatory Visit: Payer: Self-pay

## 2023-02-26 NOTE — Patient Outreach (Signed)
  Care Coordination   02/26/2023 Name: Crystal Bass MRN: [id number] DOB: 03-19-1941   Care Coordination Outreach Attempts:  An unsuccessful telephone outreach was attempted for a scheduled appointment today. HPAA compliant message left with call back phone number.   Follow Up Plan:  Additional outreach attempts will be made to offer the patient care coordination information and services.   Encounter Outcome:  No Answer   Care Coordination Interventions:  No, not indicated    Quinn Plowman [facility] [place] [phone] direct line

## 2023-02-27 DIAGNOSIS — J449 Chronic obstructive pulmonary disease, unspecified: Secondary | ICD-10-CM | POA: Diagnosis not present

## 2023-02-27 DIAGNOSIS — R2689 Other abnormalities of gait and mobility: Secondary | ICD-10-CM | POA: Diagnosis not present

## 2023-02-27 DIAGNOSIS — M6281 Muscle weakness (generalized): Secondary | ICD-10-CM | POA: Diagnosis not present

## 2023-02-27 DIAGNOSIS — F419 Anxiety disorder, unspecified: Secondary | ICD-10-CM | POA: Diagnosis not present

## 2023-02-27 DIAGNOSIS — S72002D Fracture of unspecified part of neck of left femur, subsequent encounter for closed fracture with routine healing: Secondary | ICD-10-CM | POA: Diagnosis not present

## 2023-02-27 DIAGNOSIS — I5033 Acute on chronic diastolic (congestive) heart failure: Secondary | ICD-10-CM | POA: Diagnosis not present

## 2023-02-27 DIAGNOSIS — Z96642 Presence of left artificial hip joint: Secondary | ICD-10-CM | POA: Diagnosis not present

## 2023-02-27 DIAGNOSIS — G20A1 Parkinson's disease without dyskinesia, without mention of fluctuations: Secondary | ICD-10-CM | POA: Diagnosis not present

## 2023-02-27 DIAGNOSIS — R2681 Unsteadiness on feet: Secondary | ICD-10-CM | POA: Diagnosis not present

## 2023-02-28 ENCOUNTER — Telehealth: Payer: Self-pay

## 2023-02-28 NOTE — Patient Outreach (Signed)
  Care Coordination   02/28/2023 Name: Crystal Bass MRN: [id number] DOB: 1941/03/10   Care Coordination Outreach Attempts:  An unsuccessful telephone outreach was attempted today to offer the patient information about available care coordination services as a benefit of their health plan.  HIPAA compliant message left with call back phone number.   Follow Up Plan:  Additional outreach attempts will be made to offer the patient care coordination information and services.   Encounter Outcome:  No Answer   Care Coordination Interventions:  No, not indicated   Quinn Plowman [facility] [place] [phone] direct line

## 2023-03-01 DIAGNOSIS — I5033 Acute on chronic diastolic (congestive) heart failure: Secondary | ICD-10-CM | POA: Diagnosis not present

## 2023-03-01 DIAGNOSIS — M6281 Muscle weakness (generalized): Secondary | ICD-10-CM | POA: Diagnosis not present

## 2023-03-01 DIAGNOSIS — S72002D Fracture of unspecified part of neck of left femur, subsequent encounter for closed fracture with routine healing: Secondary | ICD-10-CM | POA: Diagnosis not present

## 2023-03-01 DIAGNOSIS — G20A1 Parkinson's disease without dyskinesia, without mention of fluctuations: Secondary | ICD-10-CM | POA: Diagnosis not present

## 2023-03-01 DIAGNOSIS — R2689 Other abnormalities of gait and mobility: Secondary | ICD-10-CM | POA: Diagnosis not present

## 2023-03-01 DIAGNOSIS — J449 Chronic obstructive pulmonary disease, unspecified: Secondary | ICD-10-CM | POA: Diagnosis not present

## 2023-03-01 DIAGNOSIS — Z96642 Presence of left artificial hip joint: Secondary | ICD-10-CM | POA: Diagnosis not present

## 2023-03-01 DIAGNOSIS — R2681 Unsteadiness on feet: Secondary | ICD-10-CM | POA: Diagnosis not present

## 2023-03-01 DIAGNOSIS — F419 Anxiety disorder, unspecified: Secondary | ICD-10-CM | POA: Diagnosis not present

## 2023-03-04 DIAGNOSIS — I5033 Acute on chronic diastolic (congestive) heart failure: Secondary | ICD-10-CM | POA: Diagnosis not present

## 2023-03-04 DIAGNOSIS — G20A1 Parkinson's disease without dyskinesia, without mention of fluctuations: Secondary | ICD-10-CM | POA: Diagnosis not present

## 2023-03-04 DIAGNOSIS — R2689 Other abnormalities of gait and mobility: Secondary | ICD-10-CM | POA: Diagnosis not present

## 2023-03-04 DIAGNOSIS — Z96642 Presence of left artificial hip joint: Secondary | ICD-10-CM | POA: Diagnosis not present

## 2023-03-04 DIAGNOSIS — S72002D Fracture of unspecified part of neck of left femur, subsequent encounter for closed fracture with routine healing: Secondary | ICD-10-CM | POA: Diagnosis not present

## 2023-03-04 DIAGNOSIS — J449 Chronic obstructive pulmonary disease, unspecified: Secondary | ICD-10-CM | POA: Diagnosis not present

## 2023-03-04 DIAGNOSIS — R2681 Unsteadiness on feet: Secondary | ICD-10-CM | POA: Diagnosis not present

## 2023-03-04 DIAGNOSIS — M6281 Muscle weakness (generalized): Secondary | ICD-10-CM | POA: Diagnosis not present

## 2023-03-04 DIAGNOSIS — F419 Anxiety disorder, unspecified: Secondary | ICD-10-CM | POA: Diagnosis not present

## 2023-03-05 ENCOUNTER — Other Ambulatory Visit: Payer: Self-pay | Admitting: Nurse Practitioner

## 2023-03-05 DIAGNOSIS — J432 Centrilobular emphysema: Secondary | ICD-10-CM

## 2023-03-05 DIAGNOSIS — J4530 Mild persistent asthma, uncomplicated: Secondary | ICD-10-CM

## 2023-03-06 DIAGNOSIS — J449 Chronic obstructive pulmonary disease, unspecified: Secondary | ICD-10-CM | POA: Diagnosis not present

## 2023-03-06 DIAGNOSIS — S72002D Fracture of unspecified part of neck of left femur, subsequent encounter for closed fracture with routine healing: Secondary | ICD-10-CM | POA: Diagnosis not present

## 2023-03-06 DIAGNOSIS — M6281 Muscle weakness (generalized): Secondary | ICD-10-CM | POA: Diagnosis not present

## 2023-03-06 DIAGNOSIS — G20A1 Parkinson's disease without dyskinesia, without mention of fluctuations: Secondary | ICD-10-CM | POA: Diagnosis not present

## 2023-03-06 DIAGNOSIS — R2689 Other abnormalities of gait and mobility: Secondary | ICD-10-CM | POA: Diagnosis not present

## 2023-03-06 DIAGNOSIS — Z96642 Presence of left artificial hip joint: Secondary | ICD-10-CM | POA: Diagnosis not present

## 2023-03-06 DIAGNOSIS — I5033 Acute on chronic diastolic (congestive) heart failure: Secondary | ICD-10-CM | POA: Diagnosis not present

## 2023-03-06 DIAGNOSIS — F419 Anxiety disorder, unspecified: Secondary | ICD-10-CM | POA: Diagnosis not present

## 2023-03-06 DIAGNOSIS — R2681 Unsteadiness on feet: Secondary | ICD-10-CM | POA: Diagnosis not present

## 2023-03-08 DIAGNOSIS — R2681 Unsteadiness on feet: Secondary | ICD-10-CM | POA: Diagnosis not present

## 2023-03-08 DIAGNOSIS — S72002D Fracture of unspecified part of neck of left femur, subsequent encounter for closed fracture with routine healing: Secondary | ICD-10-CM | POA: Diagnosis not present

## 2023-03-08 DIAGNOSIS — J449 Chronic obstructive pulmonary disease, unspecified: Secondary | ICD-10-CM | POA: Diagnosis not present

## 2023-03-08 DIAGNOSIS — G20A1 Parkinson's disease without dyskinesia, without mention of fluctuations: Secondary | ICD-10-CM | POA: Diagnosis not present

## 2023-03-08 DIAGNOSIS — F419 Anxiety disorder, unspecified: Secondary | ICD-10-CM | POA: Diagnosis not present

## 2023-03-08 DIAGNOSIS — I5033 Acute on chronic diastolic (congestive) heart failure: Secondary | ICD-10-CM | POA: Diagnosis not present

## 2023-03-08 DIAGNOSIS — M6281 Muscle weakness (generalized): Secondary | ICD-10-CM | POA: Diagnosis not present

## 2023-03-08 DIAGNOSIS — Z96642 Presence of left artificial hip joint: Secondary | ICD-10-CM | POA: Diagnosis not present

## 2023-03-08 DIAGNOSIS — R2689 Other abnormalities of gait and mobility: Secondary | ICD-10-CM | POA: Diagnosis not present

## 2023-03-11 DIAGNOSIS — S72002D Fracture of unspecified part of neck of left femur, subsequent encounter for closed fracture with routine healing: Secondary | ICD-10-CM | POA: Diagnosis not present

## 2023-03-11 DIAGNOSIS — R2681 Unsteadiness on feet: Secondary | ICD-10-CM | POA: Diagnosis not present

## 2023-03-11 DIAGNOSIS — M6281 Muscle weakness (generalized): Secondary | ICD-10-CM | POA: Diagnosis not present

## 2023-03-11 DIAGNOSIS — R2689 Other abnormalities of gait and mobility: Secondary | ICD-10-CM | POA: Diagnosis not present

## 2023-03-11 DIAGNOSIS — I5033 Acute on chronic diastolic (congestive) heart failure: Secondary | ICD-10-CM | POA: Diagnosis not present

## 2023-03-11 DIAGNOSIS — Z96642 Presence of left artificial hip joint: Secondary | ICD-10-CM | POA: Diagnosis not present

## 2023-03-11 DIAGNOSIS — F419 Anxiety disorder, unspecified: Secondary | ICD-10-CM | POA: Diagnosis not present

## 2023-03-11 DIAGNOSIS — J449 Chronic obstructive pulmonary disease, unspecified: Secondary | ICD-10-CM | POA: Diagnosis not present

## 2023-03-11 DIAGNOSIS — G20A1 Parkinson's disease without dyskinesia, without mention of fluctuations: Secondary | ICD-10-CM | POA: Diagnosis not present

## 2023-03-13 DIAGNOSIS — M6281 Muscle weakness (generalized): Secondary | ICD-10-CM | POA: Diagnosis not present

## 2023-03-13 DIAGNOSIS — S72002D Fracture of unspecified part of neck of left femur, subsequent encounter for closed fracture with routine healing: Secondary | ICD-10-CM | POA: Diagnosis not present

## 2023-03-13 DIAGNOSIS — G20A1 Parkinson's disease without dyskinesia, without mention of fluctuations: Secondary | ICD-10-CM | POA: Diagnosis not present

## 2023-03-13 DIAGNOSIS — I5033 Acute on chronic diastolic (congestive) heart failure: Secondary | ICD-10-CM | POA: Diagnosis not present

## 2023-03-13 DIAGNOSIS — R2689 Other abnormalities of gait and mobility: Secondary | ICD-10-CM | POA: Diagnosis not present

## 2023-03-13 DIAGNOSIS — R2681 Unsteadiness on feet: Secondary | ICD-10-CM | POA: Diagnosis not present

## 2023-03-13 DIAGNOSIS — F419 Anxiety disorder, unspecified: Secondary | ICD-10-CM | POA: Diagnosis not present

## 2023-03-13 DIAGNOSIS — Z96642 Presence of left artificial hip joint: Secondary | ICD-10-CM | POA: Diagnosis not present

## 2023-03-13 DIAGNOSIS — J449 Chronic obstructive pulmonary disease, unspecified: Secondary | ICD-10-CM | POA: Diagnosis not present

## 2023-03-14 ENCOUNTER — Ambulatory Visit: Payer: Medicare PPO | Attending: Cardiovascular Disease | Admitting: Cardiovascular Disease

## 2023-03-14 ENCOUNTER — Telehealth: Payer: Self-pay

## 2023-03-14 VITALS — BP 130/80 | HR 66 | Ht 62.0 in | Wt 115.0 lb

## 2023-03-14 DIAGNOSIS — I340 Nonrheumatic mitral (valve) insufficiency: Secondary | ICD-10-CM | POA: Diagnosis not present

## 2023-03-14 IMAGING — CT CT ANGIO CHEST
3 of 6 series · 16 of 46 positions shown · IV contrast (omnipaque)
Comparison: 08/17/2020;

CLINICAL DATA: Follow-up thoracic aortic aneurysm. Remote history
of left-sided breast cancer. History of DASHEIKIE.

EXAM:
CT ANGIOGRAPHY CHEST WITH CONTRAST
TECHNIQUE: Multidetector CT imaging of the chest was performed using the
standard protocol during bolus administration of intravenous
contrast. Multiplanar CT image reconstructions and MIPs were
obtained to evaluate the vascular anatomy.
CONTRAST:  75mL OMNIPAQUE IOHEXOL 350 MG/ML SOLN

[Series 4: axial arterial · axial · arterial · 0.58mm/px · z∈[+98,+356]mm · 11 of 104 slices shown]
[im 9/104  lung]
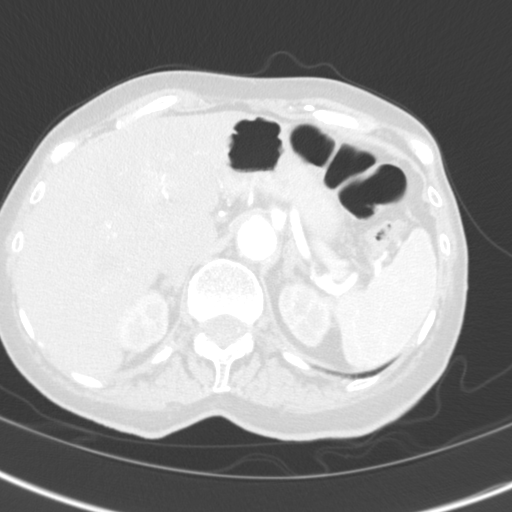
[im 18/104  soft-tissue]
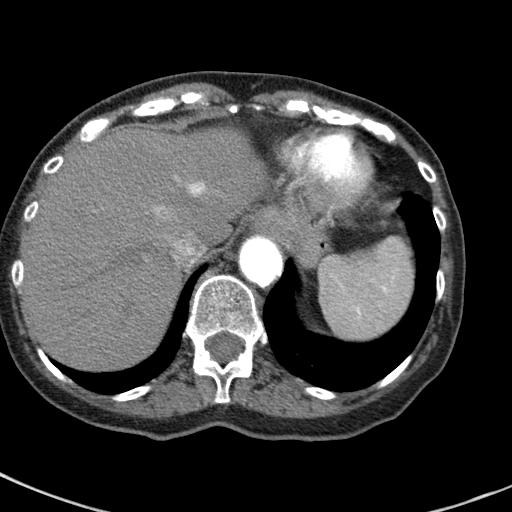
[im 26/104  lung]
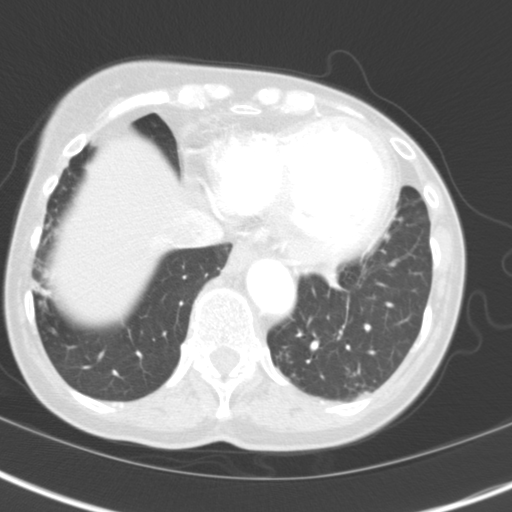
[im 35/104  soft-tissue]
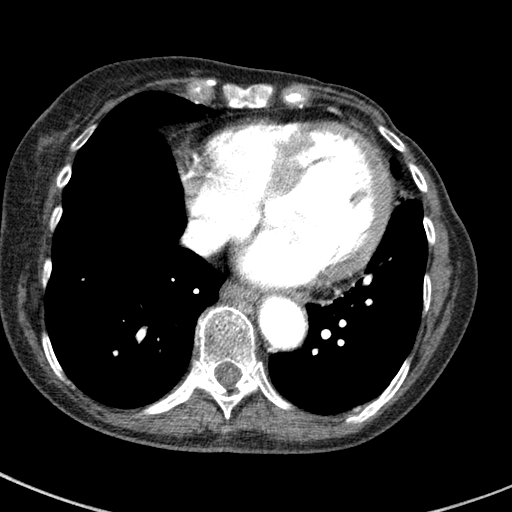
[im 43/104  lung]
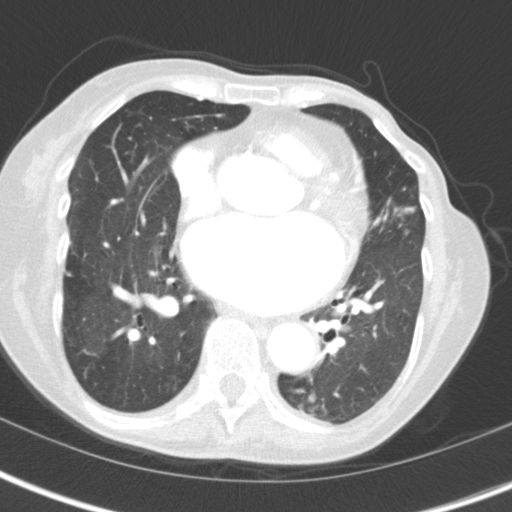
[im 52/104  soft-tissue]
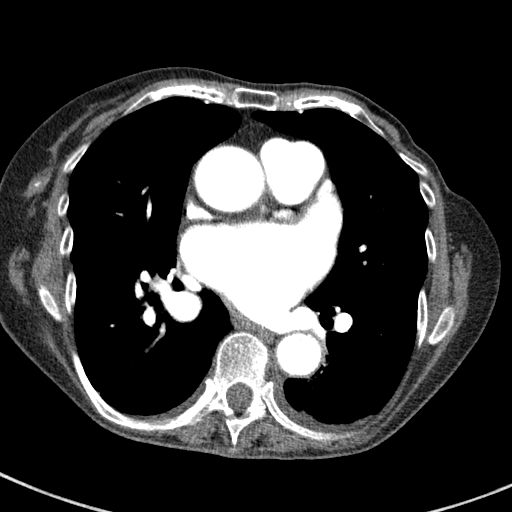
[im 61/104  lung]
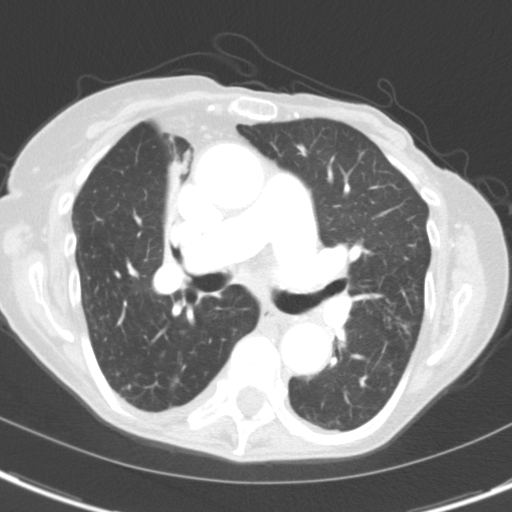
[im 69/104  soft-tissue]
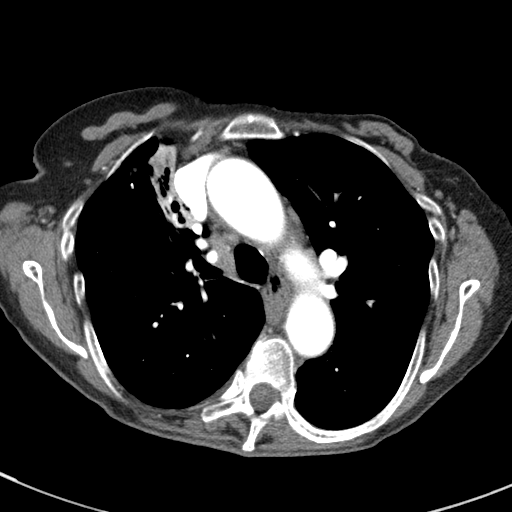
[im 78/104  lung]
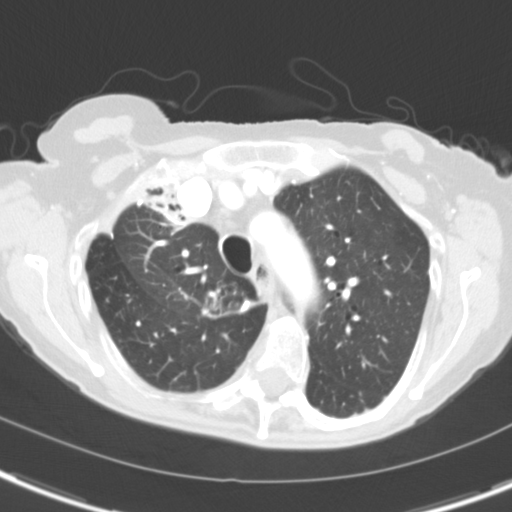
[im 86/104  soft-tissue]
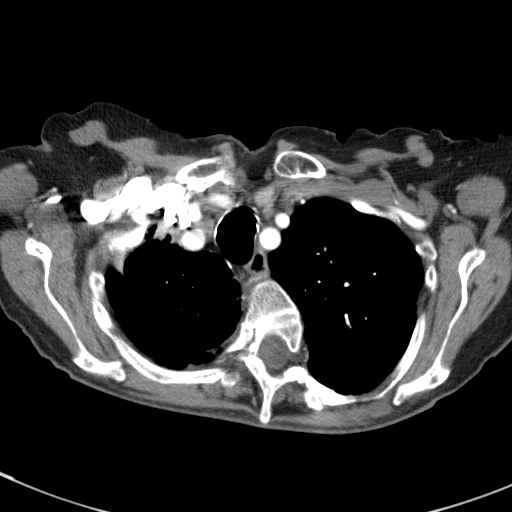
[im 95/104  lung]
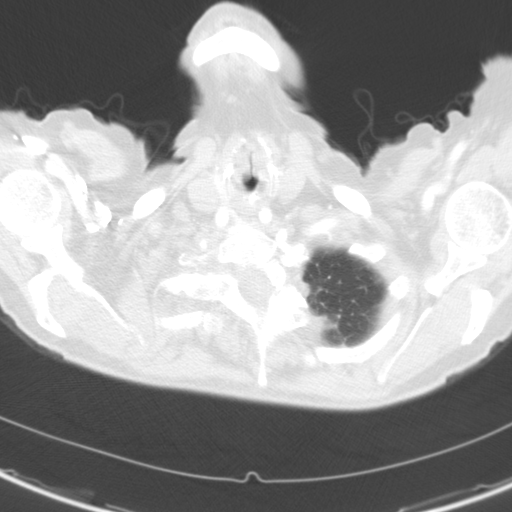

[Series 5: lung · axial · 0.58mm/px · z∈[+108,+142]mm · 2 of 156 slices shown]
[im 18/156  soft-tissue]
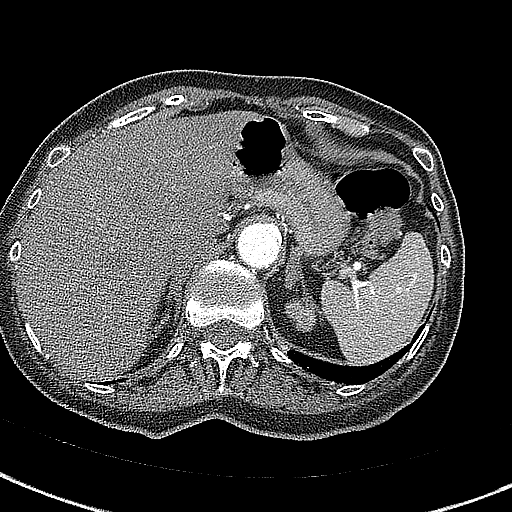
[im 35/156  soft-tissue]
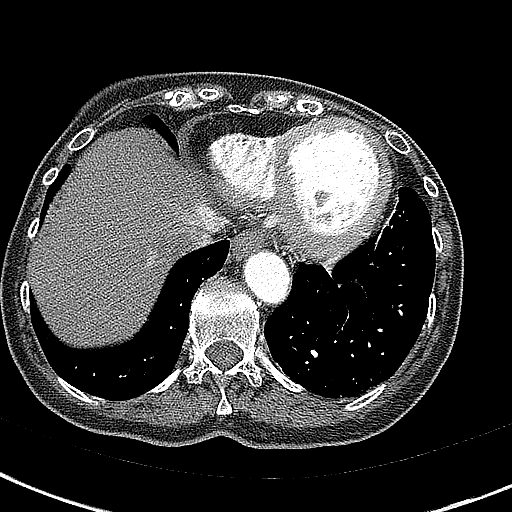

[Series 7: coronal · coronal · 0.58mm/px · 3 of 78 slices shown]
[im 20/78  soft-tissue]
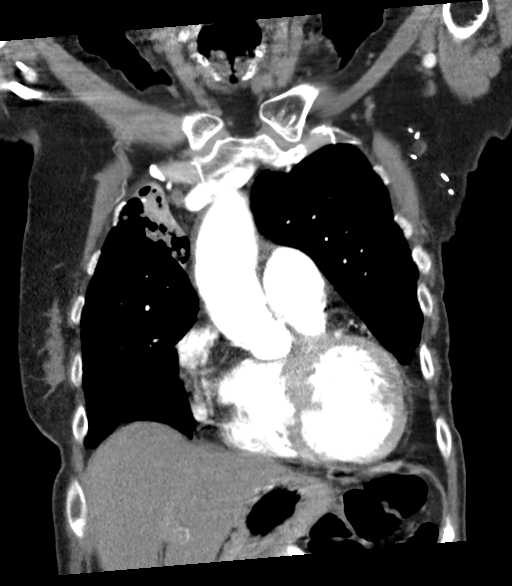
[im 39/78  soft-tissue]
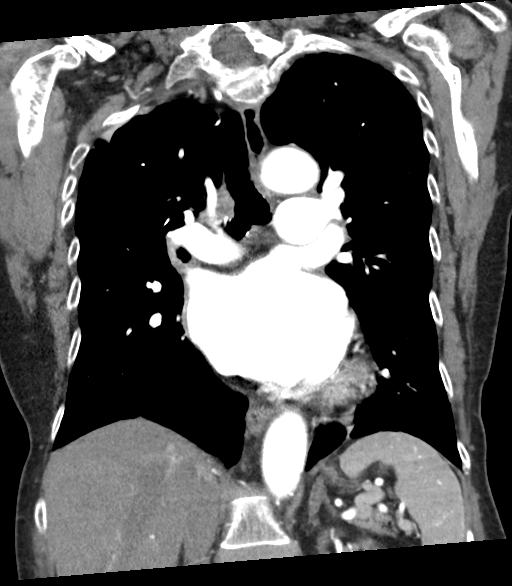
[im 58/78  soft-tissue]
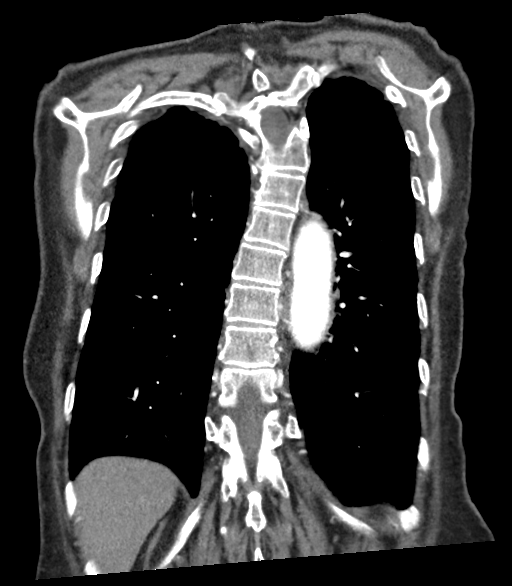

[16 of 46 positions shown; findings below may reference images not displayed]

10/28/2018; 10/26/2018; 08/22/2018;
11/01/2017; 03/21/2017; 02/23/2015; 02/04/2014; 07/05/2003
FINDINGS: Vascular Findings:

Redemonstrated mild fusiform ectasia of the ascending thoracic aorta
and proximal aspects of the aortic arch with measurements as
follows. The thoracic aorta tapers to a normal caliber at the level
of the aortic arch. The descending thoracic aorta is of normal
caliber. No evidence of thoracic aortic dissection or periaortic
stranding on this nongated examination. There is a minimal amount of
calcified atherosclerotic plaque involving the aortic arch and
descending thoracic aorta, not resulting in a hemodynamically
significant stenosis.

Cardiomegaly.  No pericardial effusion.

[place] aeration of the aortic arch. The branch vessels of the
aortic arch appear patent throughout their imaged courses.

Although this examination was not tailored for the evaluation the
pulmonary arteries, there are no discrete filling defects within the
central pulmonary arterial tree to suggest central pulmonary
embolism. Borderline enlarged caliber the main pulmonary artery
measures 32 mm in diameter.

-------------------------------------------------------------

Thoracic aortic measurements:

SINOTUBULAR JUNCTION: 34 mm as measured in greatest oblique short
axis coronal dimension.

PROXIMAL ASCENDING THORACIC AORTA: 39 mm as measured in greatest
oblique short axis axial dimension at the level of the main
pulmonary artery (axial image 46, series 4), and approximately 39 mm
as measured in greatest oblique short axis coronal diameter (coronal
image 23, series 7), similar to the 5559 examination by my direct
remeasurement.

AORTIC ARCH: 29 mm as measured in greatest oblique short axis
sagittal dimension. The proximal most aspect of the aortic arch is
unchanged in size measuring 36 mm in diameter (image 38, series 4,
similar to the 5559 examination by my direct remeasurement.

PROXIMAL DESCENDING THORACIC AORTA: 29 mm as measured in greatest
oblique short axis axial dimension at the level of the main
pulmonary artery.

DISTAL DESCENDING THORACIC AORTA: 28 mm as measured in greatest
oblique short axis axial dimension at the level of the diaphragmatic
hiatus.

Review of the MIP images confirms the above findings.

-------------------------------------------------------------

Non-Vascular Findings:

Mediastinum/Lymph Nodes: No bulky mediastinal, hilar or axillary
lymphadenopathy.

Lungs/Pleura: Redemonstrated biapical pleuroparenchymal thickening.
Similar appearing postoperative change of the right upper lobe with
associated architectural distortion and volume loss.

The approximately 0.7 cm right lower lobe nodule with associated
adjacent architectural distortion is unchanged since at least the
5219 examination and likely sequela of previous infection.
Redemonstrated consolidative opacities and volume loss involving the
inferior segment of the lingula (image 99, series 5), similar to the
6420 examination and also favored to be sequelae of previous
infection.

No new discrete focal airspace opacities. No pleural effusion or
pneumothorax. The remaining central pulmonary airways appear patent.

Upper abdomen: Limited early arterial phase evaluation of the upper
abdomen suggest diffuse decreased attenuation hepatic parenchyma.
There is mild thickening of the left adrenal gland without discrete
nodule. Redemonstrated punctate (approximately 0.5 cm) hemangioma
within the dome of the spleen (image 91, series 4).

Musculoskeletal: No acute or aggressive osseous abnormalities.
Accentuated kyphotic curvature without associated compression
deformity. Moderate sclerotic curvature of the thoracolumbar spine.
Stable sequela of left mastectomy and axillary lymph node
dissection. Regional soft tissues are otherwise normal. Normal
appearance of the thyroid gland.
IMPRESSION: 1. Stable uncomplicated fusiform ectasia of the ascending thoracic
aorta and mild aneurysmal dilatation of the proximal aspect of the
aortic arch, measuring 39 and 35 mm in diameter respectively, both
unchanged compared to 5559 examination by my direct remeasurement.
Aortic aneurysm NOS ([id number]).
2. Stable postoperative change of the right upper lobe with
associated volume loss and architectural distortion.
3. Cardiomegaly with enlargement of the caliber of the main
pulmonary artery, nonspecific though could be seen the setting of
pulmonary arterial hypertension.
4. Coronary artery calcifications. Aortic Atherosclerosis
([id number]-034.4).
5. Stable sequela of previous DASHEIKIE infection as detailed above.

## 2023-03-14 NOTE — Progress Notes (Addendum)
Cardiology Office Note:    Date:  03/15/2023   ID:  Crystal Bass, DOB 1941-01-15, MRN [id number]  PCP:  Eustaquio Boyden, MD   [facility] Providers Cardiologist:  Lorine Bears, MD     Referring MD: Eustaquio Boyden, MD   Chief Complaint  Patient presents with   Shortness of Breath    History of Present Illness:    Crystal Bass is a 82 y.o. adult presenting for follow-up of severe mitral regurgitation.  The patient underwent transcatheter edge-to-edge repair of the mitral valve about 1 year ago.  She was treated with 2 MitraClip XTW devices, initially reducing mitral regurgitation from 4+ down to 1+.  However, her symptoms recurred shortly after the procedure and she developed severe recurrent mitral regurgitation within a few months.  Her comorbid medical conditions include Parkinson's disease, chronic [name] infection, and remote mastectomy.  She has no coronary artery disease.  She was treated with edge-to-edge repair of the mitral valve for severe mitral regurgitation caused by severe prolapse with a Barlow type valve.  I saw her back last summer and reviewed potential treatment options for her residual mitral regurgitation which is located lateral to the other clips that have been implanted.  We scheduled her for repeat MitraClip procedure but she called and canceled the procedure a few days later.  She was concerned about some of the things we discussed such as creating mitral stenosis and also concerned about procedural risk.  She is here with her brother today.  She continues to feel poorly.  She is limited by shortness of breath with low-level exertion.  She also has a lot of fatigue.  She has had no chest pain, orthopnea, or PND.  She denies leg swelling.  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 10/2021 Cath: Nl cors.   Aortic root aneurysm    Arthritis    Asthma    Breast  cancer 2002   infiltrative ductal carcinoma    Cataract 2019   Chronic heart failure with preserved ejection fraction (HFpEF)    COPD (chronic obstructive pulmonary disease) 07/2007   Ductal carcinoma of breast, estrogen receptor positive, stage 1 09/16/2012   Endometriosis    GERD (gastroesophageal reflux disease)    Hypercalcemia 09/14/2013   Hypertension    Lesion of breast 1992   right, benign   Lung disease    secondary to [name] infection   Mastalgia 05/1994   Mitral regurgitation    a. 10/2021 RHC: PCWP V wave of 34; b. 01/2022 TEE: Severe flail/prolapse of large area of A2 scallop with severe posteriorly directed MR; c. 01/2022 TEER w /Mitraclip XTW x 2; d. 07/2022 Echo: EF 60-65%. No rwma, mild LVH, GrIII DD. Sev dil LA. Mod elev PASP. Mod-Sev MR. Mod MS.   Osteoporosis, post-menopausal 03/14/2012   Ovarian tumor of borderline malignancy, right 2004   Palpitations    a. 02/2022 Zio: Predominantly sinus rhythm at 67 (52-171).  2 runs of NSVT -fastest 9 beats at 167.  35 SVT runs-longest 19 beats, fastest 171x6 beats.   Pancreatitis    secondary to Cholelithiasis   Parkinson disease 02/10/2015   Post-thoracotomy pain syndrome    Pseudomonas pneumonia 03/2008   Status post implantation of mitral valve leaflet clip 02/15/2022   s/p XTW MitraClip x2 by Dr. Excell Seltzer in the A2/P2 position   Traumatic  closed fracture of distal clavicle with minimal displacement, left, initial encounter 06/2021   [facility]   Uterine fibroid    Vitamin D deficiency     Past Surgical History:  Procedure Laterality Date   ABDOMINAL HYSTERECTOMY     & BSO for Mucinous borderline tumor of R ovary 2004   APPENDECTOMY  2004   BREAST BIOPSY  08/2004   right breast-benign   BREAST LUMPECTOMY  1992   benign   BUBBLE STUDY  12/27/2021   Procedure: BUBBLE STUDY;  Surgeon: Sande Rives, MD;  Location: [facility] ENDOSCOPY;  Service: Cardiovascular;;   CARDIAC CATHETERIZATION  2007   essentially negation for  significant CAD   CHOLECYSTECTOMY  2001   COLONOSCOPY  2014   Dr Marina Goodell , due 2019   COLONOSCOPY  09/2018   2 TA removed, int hem, no f/u needed (Dr Marina Goodell)    ESOPHAGOGASTRODUODENOSCOPY  09/2018   GERD with esophagitis and stricture dilated, antral gastritis negative for H pylori Marina Goodell)   HIP ARTHROPLASTY Left 08/21/2022   Procedure: ARTHROPLASTY BIPOLAR HIP (HEMIARTHROPLASTY);  Surgeon: Juanell Fairly, MD;  Location: [facility] ORS;  Service: Orthopedics;  Laterality: Left;   LUNG BIOPSY  2002   [name], Dr Edwyna Shell   MASTECTOMY MODIFIED RADICAL Left 2002   oral chemotheraphy (tamoxifen then Armidex) no radiation, Dr.[name]   MITRAL VALVE REPAIR N/A 02/15/2022   Procedure: MITRAL VALVE REPAIR;  Surgeon: Tonny Bollman, MD;  Location: [facility] INVASIVE CV LAB;  Service: Cardiovascular;  Laterality: N/A;   RIGHT/LEFT HEART CATH AND CORONARY ANGIOGRAPHY Bilateral 11/14/2021   Procedure: RIGHT/LEFT HEART CATH AND CORONARY ANGIOGRAPHY;  Surgeon: Iran Ouch, MD;  Location: [facility] INVASIVE CV LAB;  Service: Cardiovascular;  Laterality: Bilateral;   TEE WITHOUT CARDIOVERSION N/A 12/29/2018   Procedure: TRANSESOPHAGEAL ECHOCARDIOGRAM (TEE);  Surgeon: Iran Ouch, MD;  Location: [facility] ORS;  Service: Cardiovascular;  Laterality: N/A;   TEE WITHOUT CARDIOVERSION N/A 12/27/2021   Procedure: TRANSESOPHAGEAL ECHOCARDIOGRAM (TEE);  Surgeon: Sande Rives, MD;  Location: [facility] ENDOSCOPY;  Service: Cardiovascular;  Laterality: N/A;   TEE WITHOUT CARDIOVERSION N/A 02/15/2022   Procedure: TRANSESOPHAGEAL ECHOCARDIOGRAM (TEE);  Surgeon: Tonny Bollman, MD;  Location: [facility] INVASIVE CV LAB;  Service: Cardiovascular;  Laterality: N/A;   TEE WITHOUT CARDIOVERSION N/A 05/14/2022   Procedure: TRANSESOPHAGEAL ECHOCARDIOGRAM (TEE);  Surgeon: Sande Rives, MD;  Location: [facility] ENDOSCOPY;  Service: Cardiovascular;  Laterality: N/A;   TONSILLECTOMY  1946    Current Medications: Current Meds  Medication Sig    acetaminophen (TYLENOL) 500 MG tablet Take 1,000 mg by mouth every 12 (twelve) hours as needed.   albuterol (VENTOLIN HFA) 108 (90 Base) MCG/ACT inhaler Inhale 2 puffs into the lungs every 6 (six) hours as needed for wheezing or shortness of breath.   amLODipine (NORVASC) 2.5 MG tablet Take 1 tablet (2.5 mg total) by mouth daily.   aspirin 81 MG tablet Take 1 tablet (81 mg total) by mouth daily.   calcium carbonate (TUMS - DOSED IN MG ELEMENTAL CALCIUM) 500 MG chewable tablet Chew 1 tablet by mouth as needed for indigestion or heartburn.   Carbidopa-Levodopa ER (SINEMET CR) 25-100 MG tablet controlled release TAKE TWO TABLET BY MOUTH AT NINE IN THE MORNING, ONE TAB AT ONE IN THE EVENING AND ONE TAB AT FIVE IN THE EVENING   Cholecalciferol (VITAMIN D3) 25 MCG (1000 UT) CAPS Take 1 capsule (1,000 Units total) by mouth daily.   clonazePAM (KLONOPIN) 0.5 MG tablet TAKE ONE HALF (1/2) A TABLET BY MOUTH  AT BEDTIME   EPINEPHrine 0.3 mg/0.3 mL IJ SOAJ injection Inject 0.3 mg into the muscle as needed for anaphylaxis.   FEROSUL 325 (65 Fe) MG tablet TAKE ONE TABLET BY MOUTH DAILY WITH BREAKFAST   fexofenadine (ALLEGRA) 180 MG tablet Take 180 mg by mouth daily.   Fluticasone-Umeclidin-Vilant (TRELEGY ELLIPTA) 100-62.5-25 MCG/ACT AEPB Inhale 1 puff into the lungs daily.   furosemide (LASIX) 40 MG tablet Take 1 tablet (40 mg total) by mouth daily. May take an extra 20 mg in the afternoon for a weight gain of 3 lbs in 48 hours or 5 lbs in a week   gabapentin ([name]) 300 MG capsule TAKE TWO CAPSULES BY MOUTH THREE TIMES DAILY   pantoprazole (PROTONIX) 20 MG tablet Take 20 mg by mouth daily.   potassium chloride SA (KLOR-CON M) 20 MEQ tablet TAKE ONE TABLET BY MOUTH ONCE A DAY     Allergies:   Sulfa antibiotics, Sulfonamide derivatives, Topamax [topiramate], Clarithromycin, Hydrocodone, Motrin [ibuprofen], Misc. 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  Tobacco Use   Smoking status: Never   Smokeless tobacco: Never  Vaping Use   Vaping Use: Never used  Substance and Sexual Activity   Alcohol use: No    Alcohol/week: 0.0 standard drinks of alcohol   Drug use: No   Sexual activity: Not Currently    Birth control/protection: Surgical    Comment: TAH/BSO  Other Topics Concern   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    Aunt of Dr Gilmore Laroche    Lives independent living at [facility]    No children    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, 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, 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, 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)    Activity: walking about 1 mile/day, enjoys [employer]    Diet: some water, fruits/vegetables some    Right handed    Social Determinants of Health   Financial Resource Strain: Low Risk  (08/13/2022)   Overall Financial Resource Strain (CARDIA)    Difficulty of Paying Living Expenses: Not hard at all  Food Insecurity: No Food Insecurity (11/28/2022)   Hunger Vital Sign    Worried About Running Out of Food in the Last Year: Never true    Ran Out of Food in the Last Year: Never true  Transportation Needs: No Transportation Needs (11/28/2022)   PRAPARE - Administrator, Civil Service (Medical): No    Lack of Transportation (Non-Medical): No  Recent Concern: Transportation Needs - Unmet Transportation Needs (11/22/2022)   PRAPARE - Transportation    Lack of Transportation (Medical): No    Lack of Transportation (Non-Medical): Yes  Physical Activity: Insufficiently Active (08/13/2022)   Exercise Vital Sign    Days of Exercise per Week: 4 days    Minutes of Exercise per Session: 30 min   Stress: No Stress Concern Present (08/13/2022)   [employer] of Occupational Health - Occupational Stress Questionnaire    Feeling of Stress : Not at all  Social Connections: Socially Isolated (08/13/2022)   Social Connection and Isolation Panel [NHANES]    Frequency of Communication with Friends and Family: More than  three times a week    Frequency of Social Gatherings with Friends and Family: Once a week    Attends Religious Services: Never    Database 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 or Organizations: No    Attends Banker Meetings: Never    Marital Status: Widowed     Family History: The patient's family history includes Breast cancer in her cousin; Breast cancer (age of onset: 71) in her sister; Cancer in her maternal grandfather, maternal grandmother, maternal uncle, maternal uncle, and maternal uncle; Colon cancer in her sister; Colon cancer (age of onset: 6) in her cousin; Emphysema in her mother; Liver cancer in her maternal aunt; Lung cancer (age of onset: 52) in her cousin; Lung cancer (age of onset: 55) in her father; Melanoma in her maternal aunt; Myasthenia gravis in her paternal aunt; Osteoporosis in her paternal aunt; Parkinson's disease in her cousin; Stroke in her paternal aunt. There is no history of Diabetes, Heart disease, Stomach cancer, or Ulcerative colitis.  ROS:   Please see the history of present illness.    All other systems reviewed and are negative.  EKGs/Labs/Other Studies Reviewed:    The following studies were reviewed today: Cardiac Studies & Procedures   CARDIAC CATHETERIZATION  CARDIAC CATHETERIZATION 02/15/2022  Narrative Successful TEER with placement of 2 Mitraclip XTW devices, reducing non-rheumatic MR from 4+ to 1+, with both clips positioned A2/P2. Mean transmitral gradient 4 mmhg at case completion.  See full op note dictated separately   CARDIAC CATHETERIZATION  CARDIAC CATHETERIZATION 11/14/2021  Narrative   The left ventricular  systolic function is normal.   LV end diastolic pressure is mildly elevated.   The left ventricular ejection fraction is 55-65% by visual estimate.  1.  Normal coronary arteries. 2.  Normal left ventricular systolic function. 3.  Moderate to severe mitral regurgitation by left ventricular angiography and severe by hemodynamic evaluation 4.  Right heart catheterization showed mildly elevated filling pressures, moderate pulmonary hypertension and normal cardiac output.  Giant V waves on PCW consistent with severe mitral regurgitation.  RA: 10 / 7 mmHg RV: 42/4 mmHg PA: 47/21 with a mean of 35 mmHg PCW: A wave is 19 mmHg, V wave is 34 mmHg and mean is 21 mmHg Cardiac output is 4.68 with a cardiac index of 3.03.  Recommendations: Recommend referral to the valve clinic for evaluation of mitral valve repair.  Due to Parkinson's disease and reduced functional capacity, she might not be an ideal candidate for surgical repair.  Findings Coronary Findings Diagnostic  Dominance: Right  Left Main Vessel is angiographically normal.  Ramus Intermedius Vessel is angiographically normal.  Left Circumflex Vessel is angiographically normal.  Right Coronary Artery Vessel is angiographically normal.  Right Posterior Descending Artery Vessel is angiographically normal.  Right Posterior Atrioventricular Artery Vessel is angiographically normal.  Intervention  No interventions have been documented.     ECHOCARDIOGRAM  ECHOCARDIOGRAM COMPLETE 02/11/2023  Narrative ECHOCARDIOGRAM REPORT    Patient Name:   Crystal Bass Date of Exam: 02/11/2023 Medical Rec #:  [id number]         Height:       62.0 in Accession #:    [id number]        Weight:       113.0 lb Date of Birth:  [birth year]         BSA:          1.500 m Patient Age:    64 years  BP:           118/80 mmHg Patient Gender: F                 HR:           65 bpm. Exam Location:  [address]  Procedure: 2D Echo,  Cardiac Doppler and Color Doppler  Indications:    Z98.890 S/p Mitral clip implantation  History:        Patient has prior history of Echocardiogram examinations, most recent 08/20/2022. COPD, S/p Mitral clip implantation, Signs/Symptoms:MR; Risk Factors:Hypertension.  Mitral Valve: Mitra-Clip valve is present in the mitral position. Procedure Date: 02/15/2022.  Sonographer:    Samule Ohm RDCS Referring Phys: [phone] JILL D MCDANIEL   Sonographer Comments: Technically difficult study due to poor echo windows. Image acquisition challenging due to patient body habitus. IMPRESSIONS   1. Left ventricular ejection fraction, by estimation, is 60 to 65%. The left ventricle has normal function. The left ventricle has no regional wall motion abnormalities. There is moderate left ventricular hypertrophy. Left ventricular diastolic parameters are indeterminate. 2. Right ventricular systolic function is normal. The right ventricular size is normal. There is mildly elevated pulmonary artery systolic pressure. The estimated right ventricular systolic pressure is 45.0 mmHg. 3. Left atrial size was severely dilated. 4. S/p Mitraclip with 2 XTW clips in the A2-P2 position. Severe eccentric mitral valve regurgitation, originating lateral to the clips. Myxomatous native mitral valve with continued leaflet prolapse.. The mitral valve has been repaired/replaced. Severe mitral valve regurgitation. The mean mitral valve gradient is 5.4 mmHg with average heart rate of 62 bpm. There is a Mitra-Clip present in the mitral position. Procedure Date: 02/15/2022. 5. Tricuspid valve regurgitation is mild to moderate. 6. The aortic valve is grossly normal. Aortic valve regurgitation is trivial. No aortic stenosis is present. 7. Iatrogenic atrial septal defect s/p transeptal puncture.. Evidence of atrial level shunting detected by color flow Doppler. There is a atrial septal defect with predominantly left to right shunting  across the atrial septum. 8. The inferior vena cava is normal in size with greater than 50% respiratory variability, suggesting right atrial pressure of 3 mmHg.  FINDINGS Left Ventricle: Left ventricular ejection fraction, by estimation, is 60 to 65%. The left ventricle has normal function. The left ventricle has no regional wall motion abnormalities. The left ventricular internal cavity size was normal in size. There is moderate left ventricular hypertrophy. Left ventricular diastolic function could not be evaluated due to mitral valve repair. Left ventricular diastolic parameters are indeterminate.  Right Ventricle: The right ventricular size is normal. No increase in right ventricular wall thickness. Right ventricular systolic function is normal. There is mildly elevated pulmonary artery systolic pressure. The tricuspid regurgitant velocity is 3.24 m/s, and with an assumed right atrial pressure of 3 mmHg, the estimated right ventricular systolic pressure is 45.0 mmHg.  Left Atrium: Left atrial size was severely dilated.  Right Atrium: Right atrial size was normal in size.  Pericardium: There is no evidence of pericardial effusion.  Mitral Valve: S/p Mitraclip with 2 XTW clips in the A2-P2 position. Severe eccentric mitral valve regurgitation, originating lateral to the clips. Myxomatous native mitral valve with continued leaflet prolapse. The mitral valve has been repaired/replaced. Severe mitral valve regurgitation. There is a Mitra-Clip present in the mitral position. Procedure Date: 02/15/2022. MV peak gradient, 30.9 mmHg. The mean mitral valve gradient is 5.4 mmHg with average heart rate of 62 bpm.  Tricuspid Valve: The tricuspid valve is grossly  normal. Tricuspid valve regurgitation is mild to moderate. No evidence of tricuspid stenosis.  Aortic Valve: The aortic valve is grossly normal. Aortic valve regurgitation is trivial. No aortic stenosis is present.  Pulmonic Valve: The pulmonic  valve was not well visualized. Pulmonic valve regurgitation is trivial. No evidence of pulmonic stenosis.  Aorta: The aortic root is normal in size and structure.  Venous: The inferior vena cava is normal in size with greater than 50% respiratory variability, suggesting right atrial pressure of 3 mmHg.  IAS/Shunts: Evidence of atrial level shunting detected by color flow Doppler. There is a atrial septal defect with predominantly left to right shunting across the atrial septum.   LEFT VENTRICLE PLAX 2D LVIDd:         5.10 cm   Diastology LVIDs:         2.90 cm   LV e' medial:    5.33 cm/s LV PW:         1.30 cm   LV E/e' medial:  41.5 LV IVS:        1.50 cm   LV e' lateral:   9.90 cm/s LVOT diam:     2.00 cm   LV E/e' lateral: 22.3 LV SV:         58 LV SV Index:   39 LVOT Area:     3.14 cm   RIGHT VENTRICLE             IVC RV S prime:     17.10 cm/s  IVC diam: 1.60 cm TAPSE (M-mode): 2.6 cm RVSP:           45.0 mmHg  LEFT ATRIUM            Index        RIGHT ATRIUM           Index LA diam:      5.80 cm  3.87 cm/m   RA Pressure: 3.00 mmHg LA Vol (A4C): 144.0 ml 96.01 ml/m  RA Area:     16.20 cm RA Volume:   40.20 ml  26.80 ml/m AORTIC VALVE LVOT Vmax:   113.00 cm/s LVOT Vmean:  61.400 cm/s LVOT VTI:    0.185 m  AORTA Ao Root diam: 3.50 cm Ao Asc diam:  3.00 cm  MITRAL VALVE                TRICUSPID VALVE MV Area (PHT): 2.28 cm     TR Peak grad:   42.0 mmHg MV Area VTI:   0.93 cm     TR Vmax:        324.00 cm/s MV Peak grad:  30.9 mmHg    Estimated RAP:  3.00 mmHg MV Mean grad:  5.4 mmHg     RVSP:           45.0 mmHg MV Vmax:       2.78 m/s MV Vmean:      109.0 cm/s   SHUNTS MV Decel Time: 333 msec     Systemic VTI:  0.18 m MV E velocity: 221.00 cm/s  Systemic Diam: 2.00 cm MV A velocity: 60.90 cm/s MV E/A ratio:  3.63  Weston Brass MD Electronically signed by Weston Brass MD Signature Date/Time: 02/11/2023/12:51:20 PM    Final   TEE  ECHO TEE  05/14/2022  Narrative TRANSESOPHOGEAL ECHO REPORT    Patient Name:   Crystal Bass Date of Exam: 05/14/2022 Medical Rec #:  [id number]  Height:       62.0 in Accession #:    [id number]        Weight:       117.0 lb Date of Birth:  08-30-1941         BSA:          1.522 m Patient Age:    37 years          BP:           172/74 mmHg Patient Gender: F                 HR:           67 bpm. Exam Location:  Inpatient  Procedure: 3D Echo, Transesophageal Echo, Cardiac Doppler and Color Doppler  Indications:    I34.0 Nonrheumatic mitral (valve) insufficiency  History:        Patient has prior history of Echocardiogram examinations, most recent 03/18/2022. Mitral Valve Disease; Risk Factors:Hypertension. Mitra clip with two clips. PFO. Severe mitral regurgitation.  Mitral Valve: Mitra-Clip valve is present in the mitral position. Procedure Date: 02/15/2022.  Sonographer:    Sheralyn Boatman RDCS Referring Phys: [id number] Wille Celeste THOMPSON  PROCEDURE: After discussion of the risks and benefits of a TEE, an informed consent was obtained from the patient. TEE procedure time was 35 minutes. The transesophogeal probe was passed without difficulty through the esophogus of the patient. Imaged were obtained with the patient in a left lateral decubitus position. Sedation performed by different physician. The patient was monitored while under deep sedation. Anesthestetic sedation was provided intravenously by Anesthesiology:  of Propofol,  of Lidocaine. Image quality was excellent. The patient's vital signs; including heart rate, blood pressure, and oxygen saturation; remained stable throughout the procedure. The patient developed no complications during the procedure.  IMPRESSIONS   1. S/p mitraclip with 2 XTW clips in the A2-P2 position. There is recurrent severe eccentric mitral regurgitation related to a persistent flail A2 segment. Suspect the mechanism is the flail segment is no longer  fully captured. The jet is now lateral to the current clips and posteriorly directed. The clips are attached without detachment. MG 5-6 mmHG @ 65 bpm. The lateral orifice is 3.10 cm2 and the medial orifice is 1.61 cm2. The mitral valve has been repaired/replaced. Severe mitral valve regurgitation. Mild mitral stenosis. There is a Mitra-Clip present in the mitral position. Procedure Date: 02/15/2022. 2. Left ventricular ejection fraction, by estimation, is 65 to 70%. The left ventricle has normal function. 3. Right ventricular systolic function is normal. The right ventricular size is normal. 4. Left atrial size was severely dilated. No left atrial/left atrial appendage thrombus was detected. 5. 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 L to R shunting seen. Evidence of atrial level shunting detected by color flow Doppler. 6. Right atrial size was mild to moderately dilated. 7. The aortic valve is tricuspid. Aortic valve regurgitation is not visualized. No aortic stenosis is present.  FINDINGS Left Ventricle: Left ventricular ejection fraction, by estimation, is 65 to 70%. The left ventricle has normal function. The left ventricular internal cavity size was normal in size.  Right Ventricle: The right ventricular size is normal. No increase in right ventricular wall thickness. Right ventricular systolic function is normal.  Left Atrium: Left atrial size was severely dilated. No left atrial/left atrial appendage thrombus was detected.  Right Atrium: Right atrial size was mild to moderately dilated.  Pericardium: There is no evidence of pericardial effusion.  Mitral Valve:  S/p mitraclip with 2 XTW clips in the A2-P2 position. There is recurrent severe eccentric mitral regurgitation related to a persistent flail A2 segment. Suspect the mechanism is the flail segment is no longer fully captured. The jet is now lateral to the current clips and posteriorly directed. The clips are  attached without detachment. MG 5-6 mmHG @ 65 bpm. The lateral orifice is 3.10 cm2 and the medial orifice is 1.61 cm2. The mitral valve has been repaired/replaced. Severe mitral valve regurgitation. There is a Mitra-Clip present in the mitral position. Procedure Date: 02/15/2022. Mild mitral valve stenosis. MV peak gradient, 17.3 mmHg. The mean mitral valve gradient is 5.5 mmHg with average heart rate of 65 bpm.  Tricuspid Valve: The tricuspid valve is grossly normal. Tricuspid valve regurgitation is mild . No evidence of tricuspid stenosis.  Aortic Valve: The aortic valve is tricuspid. Aortic valve regurgitation is not visualized. No aortic stenosis is present.  Pulmonic Valve: The pulmonic valve was grossly normal. Pulmonic valve regurgitation is not visualized. No evidence of pulmonic stenosis.  Aorta: The aortic root and ascending aorta are structurally normal, with no evidence of dilitation. There is minimal (Grade I) layered plaque involving the descending aorta.  Venous: A pattern of systolic flow reversal, suggestive of severe mitral regurgitation is recorded from the left upper pulmonary vein, the left lower pulmonary vein, the right upper pulmonary vein and the right lower pulmonary vein.  IAS/Shunts: Evidence of atrial level shunting detected by color flow Doppler. 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 L to R shunting seen.    AORTA Ao Root diam: 3.26 cm Ao Asc diam:  3.80 cm  MITRAL VALVE MV Peak grad: 17.3 mmHg MV Mean grad: 5.5 mmHg MV Vmax:      2.08 m/s MV Vmean:     104.6 cm/s MR Peak grad:    102.4 mmHg MR Mean grad:    63.0 mmHg MR Vmax:         506.00 cm/s MR Vmean:        366.0 cm/s MR PISA:         18.16 cm MR PISA Eff ROA: 137 mm MR PISA Radius:  1.70 cm  Lennie Odor MD Electronically signed by Lennie Odor MD Signature Date/Time: 05/14/2022/3:00:05 PM    Final   MONITORS  LONG TERM MONITOR (3-14 DAYS)  04/12/2022  Narrative Patch Wear Time:  13 days and 19 hours (2023-04-27T14:23:18-0400 to 2023-05-11T09:37:39-0400)  Patient had a min HR of 52 bpm, max HR of 171 bpm, and avg HR of 67 bpm. Predominant underlying rhythm was Sinus Rhythm. 2 Ventricular Tachycardia runs occurred, the run with the fastest interval lasting 9 beats with a max rate of 167 bpm (avg 146 bpm); the run with the fastest interval was also the longest. 35 Supraventricular Tachycardia runs occurred, the run with the fastest interval lasting 6 beats with a max rate of 171 bpm, the longest lasting 19 beats with an avg rate of 109 bpm. Occasional PVCs with a burden of 1.7%.           Recent Labs: 03/23/2022: TSH 0.84 04/30/2022: NT-Pro BNP 403 09/25/2022: ALT 3 11/25/2022: B Natriuretic Peptide 247.6; Hemoglobin 12.3; Platelets 152 12/07/2022: BUN 17; Creatinine, Ser 0.79; Magnesium 2.2; Potassium 4.1; Sodium 137  Recent Lipid Panel    Component Value Date/Time   CHOL 171 08/08/2021 1320   TRIG 100.0 08/08/2021 1320   HDL 53.30 08/08/2021 1320   CHOLHDL 3 08/08/2021 1320   VLDL 20.0  08/08/2021 1320   LDLCALC 97 08/08/2021 1320   LDLDIRECT 135.0 08/16/2009 1359   Risk Assessment/Calculations:     STS surgical risk calculation:   Procedure Type: Isolated MVR PERIOPERATIVE OUTCOME ESTIMATE % Operative Mortality 8.42% Morbidity & Mortality 21.6% Stroke 2.51% Renal Failure 2.53% Reoperation 8.07% Prolonged Ventilation 10.9% Deep Sternal Wound Infection 0.044% Long Hospital Stay (>14 days) 12% Short Hospital Stay (<6 days)* 14.2%  Procedure Type: Isolated MV Repair PERIOPERATIVE OUTCOME ESTIMATE % Operative Mortality 4.89% Morbidity & Mortality 13.6% Stroke 5.21% Renal Failure 1.43% Reoperation 5.95% Prolonged Ventilation 6.57% Deep Sternal Wound Infection 0.037% Long Hospital Stay (>14 days) 8.08% Short Hospital Stay (<6 days)* 22.3%  Physical Exam:    VS:  BP 130/80   Pulse 66   Ht 5\' 2"  (1.575 m)    Wt 115 lb (52.2 kg)   SpO2 93%   BMI 21.03 kg/m     Wt Readings from Last 3 Encounters:  03/14/23 115 lb (52.2 kg)  02/11/23 113 lb (51.3 kg)  01/29/23 113 lb (51.3 kg)     GEN: Pleasant elderly woman in no acute distress HEENT: Normal NECK: No JVD; No carotid bruits LYMPHATICS: No lymphadenopathy CARDIAC: RRR, 3/6 holosystolic murmur at the apex RESPIRATORY:  Clear to auscultation without rales, wheezing or rhonchi  ABDOMEN: Soft, non-tender, non-distended MUSCULOSKELETAL:  No edema; No deformity  SKIN: Warm and dry NEUROLOGIC:  Alert and oriented x 3 PSYCHIATRIC:  Normal affect   ASSESSMENT:    1. Nonrheumatic mitral (valve) insufficiency    PLAN:    In order of problems listed above:  The patient has severe, grade 4+, symptomatic mitral regurgitation after initial treatment with transcatheter edge-to-edge repair of the mitral valve with 2 MitraClip XT W devices.  There is a large defect lateral to the indwelling clips, likely related to either a leaflet tear or progression of severe myxomatous disease.  We reviewed treatment options again at length.  She has NYHA functional class III symptoms and would really like to be treated if possible.  I think it is reasonable to consider another attempt at transcatheter repair with MitraClip, with plans to place a third device lateral to the indwelling clips.  There is concerned that she could have mitral stenosis created by this, but recent data suggest that patient still have clinical improvement even with higher gradients as long as the mitral regurgitation is reduced significantly.  As she would like to explore all of her treatment options, I am going to refer her to see Dr. Leafy Ro for surgical evaluation for consideration of minimally invasive surgical mitral valve replacement.  She understands this procedure would have a much higher success rate at successful treatment of her mitral regurgitation, but that the recovery would  potentially be difficult for her.  We again reviewed the MitraClip procedure at length and if she is deemed a poor operative candidate, I would plan to offer her treatment with TEER.            Medication Adjustments/Labs and Tests Ordered: Current medicines are reviewed at length with the patient today.  Concerns regarding medicines are outlined above.  No orders of the defined types were placed in this encounter.  No orders of the defined types were placed in this encounter.   Patient Instructions  Surgical Evaluation (03/25/2023): You are scheduled with Dr. Leafy Ro at [facility] (located in the [facility] Building) for further TAVR evaluation on 03/25/2023. Please arrive at 2:15PM for your appointment at 2:30PM. [address]  [address]  [place], [place] [address] Phone: [phone]  Please call their office on the day of your appointment to make sure the surgeon will be there and on time as delays or rescheduling due to emergency surgery may occur.   Medication Instructions:  Your physician recommends that you continue on your current medications as directed. Please refer to the Current Medication list given to you today.  *If you need a refill on your cardiac medications before your next appointment, please call your pharmacy*   Lab Work: NONE If you have labs (blood work) drawn today and your tests are completely normal, you will receive your results only by: MyChart Message (if you have MyChart) OR A paper copy in the mail If you have any lab test that is abnormal or we need to change your treatment, we will call you to review the results.   Testing/Procedures: NONE   Follow-Up: At [facility], you and your health needs are our priority.  As part of our continuing mission to provide you with exceptional heart care, we have created designated Provider Care Teams.  These Care Teams include your primary Cardiologist (physician) and Advanced Practice  Providers (APPs -  Physician Assistants and Nurse Practitioners) who all work together to provide you with the care you need, when you need it.  Your next appointment:   Structural Team will follow-up  Provider:   Tonny Bollman, MD      Signed, Tonny Bollman, MD  03/15/2023 12:36 PM    [facility]

## 2023-03-14 NOTE — Patient Instructions (Addendum)
Surgical Evaluation (03/25/2023): You are scheduled with Dr. Leafy Ro at [facility] (located in the [facility]) for further TAVR evaluation on 03/25/2023. Please arrive at 2:15PM for your appointment at 2:30PM. [address], [address]  [place], [place] [address] Phone: [phone]  Please call their office on the day of your appointment to make sure the surgeon will be there and on time as delays or rescheduling due to emergency surgery may occur.   Medication Instructions:  Your physician recommends that you continue on your current medications as directed. Please refer to the Current Medication list given to you today.  *If you need a refill on your cardiac medications before your next appointment, please call your pharmacy*   Lab Work: NONE If you have labs (blood work) drawn today and your tests are completely normal, you will receive your results only by: MyChart Message (if you have MyChart) OR A paper copy in the mail If you have any lab test that is abnormal or we need to change your treatment, we will call you to review the results.   Testing/Procedures: NONE   Follow-Up: At [facility], you and your health needs are our priority.  As part of our continuing mission to provide you with exceptional heart care, we have created designated Provider Care Teams.  These Care Teams include your primary Cardiologist (physician) and Advanced Practice Providers (APPs -  Physician Assistants and Nurse Practitioners) who all work together to provide you with the care you need, when you need it.  Your next appointment:   Structural Team will follow-up  Provider:   Tonny Bollman, MD

## 2023-03-14 NOTE — H&P (View-Only) (Signed)
Cardiology Office Note:    Date:  03/15/2023   ID:  Crystal Bass, DOB 03/13/1941, MRN [id number]  PCP:  Gutierrez, Javier, MD   [facility] Providers Cardiologist:  Muhammad Arida, MD     Referring MD: Gutierrez, Javier, MD   Chief Complaint  Patient presents with   Shortness of Breath    History of Present Illness:    Crystal Bass is a 82 y.o. adult presenting for follow-up of severe mitral regurgitation.  The patient underwent transcatheter edge-to-edge repair of the mitral valve about 1 year ago.  She was treated with 2 MitraClip XTW devices, initially reducing mitral regurgitation from 4+ down to 1+.  However, her symptoms recurred shortly after the procedure and she developed severe recurrent mitral regurgitation within a few months.  Her comorbid medical conditions include Parkinson's disease, chronic [name] infection, and remote mastectomy.  She has no coronary artery disease.  She was treated with edge-to-edge repair of the mitral valve for severe mitral regurgitation caused by severe prolapse with a Barlow type valve.  I saw her back last summer and reviewed potential treatment options for her residual mitral regurgitation which is located lateral to the other clips that have been implanted.  We scheduled her for repeat MitraClip procedure but she called and canceled the procedure a few days later.  She was concerned about some of the things we discussed such as creating mitral stenosis and also concerned about procedural risk.  She is here with her brother today.  She continues to feel poorly.  She is limited by shortness of breath with low-level exertion.  She also has a lot of fatigue.  She has had no chest pain, orthopnea, or PND.  She denies leg swelling.  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 10/2021 Cath: Nl cors.   Aortic root aneurysm    Arthritis    Asthma    Breast  cancer 2002   infiltrative ductal carcinoma    Cataract 2019   Chronic heart failure with preserved ejection fraction (HFpEF)    COPD (chronic obstructive pulmonary disease) 07/2007   Ductal carcinoma of breast, estrogen receptor positive, stage 1 09/16/2012   Endometriosis    GERD (gastroesophageal reflux disease)    Hypercalcemia 09/14/2013   Hypertension    Lesion of breast 1992   right, benign   Lung disease    secondary to [name] infection   Mastalgia 05/1994   Mitral regurgitation    a. 10/2021 RHC: PCWP V wave of 34; b. 01/2022 TEE: Severe flail/prolapse of large area of A2 scallop with severe posteriorly directed MR; c. 01/2022 TEER w /Mitraclip XTW x 2; d. 07/2022 Echo: EF 60-65%. No rwma, mild LVH, GrIII DD. Sev dil LA. Mod elev PASP. Mod-Sev MR. Mod MS.   Osteoporosis, post-menopausal 03/14/2012   Ovarian tumor of borderline malignancy, right 2004   Palpitations    a. 02/2022 Zio: Predominantly sinus rhythm at 67 (52-171).  2 runs of NSVT -fastest 9 beats at 167.  35 SVT runs-longest 19 beats, fastest 171x6 beats.   Pancreatitis    secondary to Cholelithiasis   Parkinson disease 02/10/2015   Post-thoracotomy pain syndrome    Pseudomonas pneumonia 03/2008   Status post implantation of mitral valve leaflet clip 02/15/2022   s/p XTW MitraClip x2 by Dr. Thea Holshouser in the A2/P2 position   Traumatic   closed fracture of distal clavicle with minimal displacement, left, initial encounter 06/2021   [facility]   Uterine fibroid    Vitamin D deficiency     Past Surgical History:  Procedure Laterality Date   ABDOMINAL HYSTERECTOMY     & BSO for Mucinous borderline tumor of R ovary 2004   APPENDECTOMY  2004   BREAST BIOPSY  08/2004   right breast-benign   BREAST LUMPECTOMY  1992   benign   BUBBLE STUDY  12/27/2021   Procedure: BUBBLE STUDY;  Surgeon: O'Neal, Monmouth Thomas, MD;  Location: [facility];  Service: Cardiovascular;;   CARDIAC CATHETERIZATION  2007   essentially negation for  significant CAD   CHOLECYSTECTOMY  2001   COLONOSCOPY  2014   Dr Perry , due 2019   COLONOSCOPY  09/2018   2 TA removed, int hem, no f/u needed (Dr Perry)    ESOPHAGOGASTRODUODENOSCOPY  09/2018   GERD with esophagitis and stricture dilated, antral gastritis negative for H pylori (Perry)   HIP ARTHROPLASTY Left 08/21/2022   Procedure: ARTHROPLASTY BIPOLAR HIP (HEMIARTHROPLASTY);  Surgeon: Krasinski, Kevin, MD;  Location: [facility];  Service: Orthopedics;  Laterality: Left;   LUNG BIOPSY  2002   [name], Dr Burney   MASTECTOMY MODIFIED RADICAL Left 2002   oral chemotheraphy (tamoxifen then Armidex) no radiation, Dr.[name]   MITRAL VALVE REPAIR N/A 02/15/2022   Procedure: MITRAL VALVE REPAIR;  Surgeon: Beuna Bolding, MD;  Location: [facility];  Service: Cardiovascular;  Laterality: N/A;   RIGHT/LEFT HEART CATH AND CORONARY ANGIOGRAPHY Bilateral 11/14/2021   Procedure: RIGHT/LEFT HEART CATH AND CORONARY ANGIOGRAPHY;  Surgeon: Arida, Muhammad A, MD;  Location: [facility];  Service: Cardiovascular;  Laterality: Bilateral;   TEE WITHOUT CARDIOVERSION N/A 12/29/2018   Procedure: TRANSESOPHAGEAL ECHOCARDIOGRAM (TEE);  Surgeon: Arida, Muhammad A, MD;  Location: [facility];  Service: Cardiovascular;  Laterality: N/A;   TEE WITHOUT CARDIOVERSION N/A 12/27/2021   Procedure: TRANSESOPHAGEAL ECHOCARDIOGRAM (TEE);  Surgeon: O'Neal, Punaluu Thomas, MD;  Location: [facility];  Service: Cardiovascular;  Laterality: N/A;   TEE WITHOUT CARDIOVERSION N/A 02/15/2022   Procedure: TRANSESOPHAGEAL ECHOCARDIOGRAM (TEE);  Surgeon: Shloima Clinch, MD;  Location: [facility];  Service: Cardiovascular;  Laterality: N/A;   TEE WITHOUT CARDIOVERSION N/A 05/14/2022   Procedure: TRANSESOPHAGEAL ECHOCARDIOGRAM (TEE);  Surgeon: O'Neal, [place] Thomas, MD;  Location: [facility];  Service: Cardiovascular;  Laterality: N/A;   TONSILLECTOMY  1946    Current Medications: Current Meds  Medication Sig    acetaminophen (TYLENOL) 500 MG tablet Take 1,000 mg by mouth every 12 (twelve) hours as needed.   albuterol (VENTOLIN HFA) 108 (90 Base) MCG/ACT inhaler Inhale 2 puffs into the lungs every 6 (six) hours as needed for wheezing or shortness of breath.   amLODipine (NORVASC) 2.5 MG tablet Take 1 tablet (2.5 mg total) by mouth Bass.   aspirin 81 MG tablet Take 1 tablet (81 mg total) by mouth Bass.   calcium carbonate (TUMS - DOSED IN MG ELEMENTAL CALCIUM) 500 MG chewable tablet Chew 1 tablet by mouth as needed for indigestion or heartburn.   Carbidopa-Levodopa ER (SINEMET CR) 25-100 MG tablet controlled release TAKE TWO TABLET BY MOUTH AT NINE IN THE MORNING, ONE TAB AT ONE IN THE EVENING AND ONE TAB AT FIVE IN THE EVENING   Cholecalciferol (VITAMIN D3) 25 MCG (1000 UT) CAPS Take 1 capsule (1,000 Units total) by mouth Bass.   clonazePAM (KLONOPIN) 0.5 MG tablet TAKE ONE HALF (1/2) A TABLET BY MOUTH   AT BEDTIME   EPINEPHrine 0.3 mg/0.3 mL IJ SOAJ injection Inject 0.3 mg into the muscle as needed for anaphylaxis.   FEROSUL 325 (65 Fe) MG tablet TAKE ONE TABLET BY MOUTH Bass WITH BREAKFAST   fexofenadine (ALLEGRA) 180 MG tablet Take 180 mg by mouth Bass.   Fluticasone-Umeclidin-Vilant (TRELEGY ELLIPTA) 100-62.5-25 MCG/ACT AEPB Inhale 1 puff into the lungs Bass.   furosemide (LASIX) 40 MG tablet Take 1 tablet (40 mg total) by mouth Bass. May take an extra 20 mg in the afternoon for a weight gain of 3 lbs in 48 hours or 5 lbs in a week   gabapentin ([name]) 300 MG capsule TAKE TWO CAPSULES BY MOUTH THREE TIMES Bass   pantoprazole (PROTONIX) 20 MG tablet Take 20 mg by mouth Bass.   potassium chloride SA (KLOR-CON M) 20 MEQ tablet TAKE ONE TABLET BY MOUTH ONCE A DAY     Allergies:   Sulfa antibiotics, Sulfonamide derivatives, Topamax [topiramate], Clarithromycin, Hydrocodone, Motrin [ibuprofen], Misc. 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  Tobacco Use   Smoking status: Never   Smokeless tobacco: Never  Vaping Use   Vaping Use: Never used  Substance and Sexual Activity   Alcohol use: No    Alcohol/week: 0.0 standard drinks of alcohol   Drug use: No   Sexual activity: Not Currently    Birth control/protection: Surgical    Comment: TAH/BSO  Other Topics Concern   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    Aunt of Dr Tom Pickard    Lives independent living at [facility]    No children    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, 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, 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)    Activity: walking about 1 mile/day, enjoys [employer]    Diet: some water, fruits/vegetables some    Right handed    Social Determinants of Health   Financial Resource Strain: Low Risk  (08/13/2022)   Overall Financial Resource Strain (CARDIA)    Difficulty of Paying Living Expenses: Not hard at all  Food Insecurity: No Food Insecurity (11/28/2022)   Hunger Vital Sign    Worried About Running Out of Food in the Last Year: Never true    Ran Out of Food in the Last Year: Never true  Transportation Needs: No Transportation Needs (11/28/2022)   PRAPARE - Transportation    Lack of Transportation (Medical): No    Lack of Transportation (Non-Medical): No  Recent Concern: Transportation Needs - Unmet Transportation Needs (11/22/2022)   PRAPARE - Transportation    Lack of Transportation (Medical): No    Lack of Transportation (Non-Medical): Yes  Physical Activity: Insufficiently Active (08/13/2022)   Exercise Vital Sign    Days of Exercise per Week: 4 days    Minutes of Exercise per Session: 30 min   Stress: No Stress Concern Present (08/13/2022)   [employer] - Occupational Stress Questionnaire    Feeling of Stress : Not at all  Social Connections: Socially Isolated (08/13/2022)   Social Connection and Isolation Panel [NHANES]    Frequency of Communication with Friends and Family: More than   three times a week    Frequency of Social Gatherings with Friends and Family: Once a week    Attends Religious Services: Never    Active Member of Clubs or Organizations: No    Attends Club or Organization Meetings: Never    Marital Status: Widowed     Family History: The patient's family history includes Breast cancer in her cousin; Breast cancer (age of onset: 52) in her sister; Cancer in her maternal grandfather, maternal grandmother, maternal uncle, maternal uncle, and maternal uncle; Colon cancer in her sister; Colon cancer (age of onset: 60) in her cousin; Emphysema in her mother; Liver cancer in her maternal aunt; Lung cancer (age of onset: 70) in her cousin; Lung cancer (age of onset: 82) in her father; Melanoma in her maternal aunt; Myasthenia gravis in her paternal aunt; Osteoporosis in her paternal aunt; Parkinson's disease in her cousin; Stroke in her paternal aunt. There is no history of Diabetes, Heart disease, Stomach cancer, or Ulcerative colitis.  ROS:   Please see the history of present illness.    All other systems reviewed and are negative.  EKGs/Labs/Other Studies Reviewed:    The following studies were reviewed today: Cardiac Studies & Procedures   CARDIAC CATHETERIZATION  CARDIAC CATHETERIZATION 02/15/2022  Narrative Successful TEER with placement of 2 Mitraclip XTW devices, reducing non-rheumatic MR from 4+ to 1+, with both clips positioned A2/P2. Mean transmitral gradient 4 mmhg at case completion.  See full op note dictated separately   CARDIAC CATHETERIZATION  CARDIAC CATHETERIZATION 11/14/2021  Narrative   The left ventricular  systolic function is normal.   LV end diastolic pressure is mildly elevated.   The left ventricular ejection fraction is 55-65% by visual estimate.  1.  Normal coronary arteries. 2.  Normal left ventricular systolic function. 3.  Moderate to severe mitral regurgitation by left ventricular angiography and severe by hemodynamic evaluation 4.  Right heart catheterization showed mildly elevated filling pressures, moderate pulmonary hypertension and normal cardiac output.  Giant V waves on PCW consistent with severe mitral regurgitation.  RA: 10 / 7 mmHg RV: 42/4 mmHg PA: 47/21 with a mean of 35 mmHg PCW: A wave is 19 mmHg, V wave is 34 mmHg and mean is 21 mmHg Cardiac output is 4.68 with a cardiac index of 3.03.  Recommendations: Recommend referral to the valve clinic for evaluation of mitral valve repair.  Due to Parkinson's disease and reduced functional capacity, she might not be an ideal candidate for surgical repair.  Findings Coronary Findings Diagnostic  Dominance: Right  Left Main Vessel is angiographically normal.  Ramus Intermedius Vessel is angiographically normal.  Left Circumflex Vessel is angiographically normal.  Right Coronary Artery Vessel is angiographically normal.  Right Posterior Descending Artery Vessel is angiographically normal.  Right Posterior Atrioventricular Artery Vessel is angiographically normal.  Intervention  No interventions have been documented.     ECHOCARDIOGRAM  ECHOCARDIOGRAM COMPLETE 02/11/2023  Narrative ECHOCARDIOGRAM REPORT    Patient Name:   Crystal Bass Date of Exam: 02/11/2023 Medical Rec #:  [id number]         Height:       62.0 in Accession #:    [id number]        Weight:       113.0 lb Date of Birth:  06/13/1941         BSA:          1.500 m Patient Age:    81 years            BP:           118/80 mmHg Patient Gender: F                 HR:           65 bpm. Exam Location:  [address]  Procedure: 2D Echo,  Cardiac Doppler and Color Doppler  Indications:    Z98.890 S/p Mitral clip implantation  History:        Patient has prior history of Echocardiogram examinations, most recent 08/20/2022. COPD, S/p Mitral clip implantation, Signs/Symptoms:MR; Risk Factors:Hypertension.  Mitral Valve: Mitra-Clip valve is present in the mitral position. Procedure Date: 02/15/2022.  Sonographer:    William Edwards RDCS Referring Phys: [id number] JILL D MCDANIEL   Sonographer Comments: Technically difficult study due to poor echo windows. Image acquisition challenging due to patient body habitus. IMPRESSIONS   1. Left ventricular ejection fraction, by estimation, is 60 to 65%. The left ventricle has normal function. The left ventricle has no regional wall motion abnormalities. There is moderate left ventricular hypertrophy. Left ventricular diastolic parameters are indeterminate. 2. Right ventricular systolic function is normal. The right ventricular size is normal. There is mildly elevated pulmonary artery systolic pressure. The estimated right ventricular systolic pressure is 45.0 mmHg. 3. Left atrial size was severely dilated. 4. S/p Mitraclip with 2 XTW clips in the A2-P2 position. Severe eccentric mitral valve regurgitation, originating lateral to the clips. Myxomatous native mitral valve with continued leaflet prolapse.. The mitral valve has been repaired/replaced. Severe mitral valve regurgitation. The mean mitral valve gradient is 5.4 mmHg with average heart rate of 62 bpm. There is a Mitra-Clip present in the mitral position. Procedure Date: 02/15/2022. 5. Tricuspid valve regurgitation is mild to moderate. 6. The aortic valve is grossly normal. Aortic valve regurgitation is trivial. No aortic stenosis is present. 7. Iatrogenic atrial septal defect s/p transeptal puncture.. Evidence of atrial level shunting detected by color flow Doppler. There is a atrial septal defect with predominantly left to right shunting  across the atrial septum. 8. The inferior vena cava is normal in size with greater than 50% respiratory variability, suggesting right atrial pressure of 3 mmHg.  FINDINGS Left Ventricle: Left ventricular ejection fraction, by estimation, is 60 to 65%. The left ventricle has normal function. The left ventricle has no regional wall motion abnormalities. The left ventricular internal cavity size was normal in size. There is moderate left ventricular hypertrophy. Left ventricular diastolic function could not be evaluated due to mitral valve repair. Left ventricular diastolic parameters are indeterminate.  Right Ventricle: The right ventricular size is normal. No increase in right ventricular wall thickness. Right ventricular systolic function is normal. There is mildly elevated pulmonary artery systolic pressure. The tricuspid regurgitant velocity is 3.24 m/s, and with an assumed right atrial pressure of 3 mmHg, the estimated right ventricular systolic pressure is 45.0 mmHg.  Left Atrium: Left atrial size was severely dilated.  Right Atrium: Right atrial size was normal in size.  Pericardium: There is no evidence of pericardial effusion.  Mitral Valve: S/p Mitraclip with 2 XTW clips in the A2-P2 position. Severe eccentric mitral valve regurgitation, originating lateral to the clips. Myxomatous native mitral valve with continued leaflet prolapse. The mitral valve has been repaired/replaced. Severe mitral valve regurgitation. There is a Mitra-Clip present in the mitral position. Procedure Date: 02/15/2022. MV peak gradient, 30.9 mmHg. The mean mitral valve gradient is 5.4 mmHg with average heart rate of 62 bpm.  Tricuspid Valve: The tricuspid valve is grossly   normal. Tricuspid valve regurgitation is mild to moderate. No evidence of tricuspid stenosis.  Aortic Valve: The aortic valve is grossly normal. Aortic valve regurgitation is trivial. No aortic stenosis is present.  Pulmonic Valve: The pulmonic  valve was not well visualized. Pulmonic valve regurgitation is trivial. No evidence of pulmonic stenosis.  Aorta: The aortic root is normal in size and structure.  Venous: The inferior vena cava is normal in size with greater than 50% respiratory variability, suggesting right atrial pressure of 3 mmHg.  IAS/Shunts: Evidence of atrial level shunting detected by color flow Doppler. There is a atrial septal defect with predominantly left to right shunting across the atrial septum.   LEFT VENTRICLE PLAX 2D LVIDd:         5.10 cm   Diastology LVIDs:         2.90 cm   LV e' medial:    5.33 cm/s LV PW:         1.30 cm   LV E/e' medial:  41.5 LV IVS:        1.50 cm   LV e' lateral:   9.90 cm/s LVOT diam:     2.00 cm   LV E/e' lateral: 22.3 LV SV:         58 LV SV Index:   39 LVOT Area:     3.14 cm   RIGHT VENTRICLE             IVC RV S prime:     17.10 cm/s  IVC diam: 1.60 cm TAPSE (M-mode): 2.6 cm RVSP:           45.0 mmHg  LEFT ATRIUM            Index        RIGHT ATRIUM           Index LA diam:      5.80 cm  3.87 cm/m   RA Pressure: 3.00 mmHg LA Vol (A4C): 144.0 ml 96.01 ml/m  RA Area:     16.20 cm RA Volume:   40.20 ml  26.80 ml/m AORTIC VALVE LVOT Vmax:   113.00 cm/s LVOT Vmean:  61.400 cm/s LVOT VTI:    0.185 m  AORTA Ao Root diam: 3.50 cm Ao Asc diam:  3.00 cm  MITRAL VALVE                TRICUSPID VALVE MV Area (PHT): 2.28 cm     TR Peak grad:   42.0 mmHg MV Area VTI:   0.93 cm     TR Vmax:        324.00 cm/s MV Peak grad:  30.9 mmHg    Estimated RAP:  3.00 mmHg MV Mean grad:  5.4 mmHg     RVSP:           45.0 mmHg MV Vmax:       2.78 m/s MV Vmean:      109.0 cm/s   SHUNTS MV Decel Time: 333 msec     Systemic VTI:  0.18 m MV E velocity: 221.00 cm/s  Systemic Diam: 2.00 cm MV A velocity: 60.90 cm/s MV E/A ratio:  3.63  Gayatri Acharya MD Electronically signed by Gayatri Acharya MD Signature Date/Time: 02/11/2023/12:51:20 PM    Final   TEE  ECHO TEE  05/14/2022  Narrative TRANSESOPHOGEAL ECHO REPORT    Patient Name:   Crystal Bass Date of Exam: 05/14/2022 Medical Rec #:  [id number]           Height:       62.0 in Accession #:    [id number]        Weight:       117.0 lb Date of Birth:  09/13/1941         BSA:          1.522 m Patient Age:    81 years          BP:           172/74 mmHg Patient Gender: F                 HR:           67 bpm. Exam Location:  Inpatient  Procedure: 3D Echo, Transesophageal Echo, Cardiac Doppler and Color Doppler  Indications:    I34.0 Nonrheumatic mitral (valve) insufficiency  History:        Patient has prior history of Echocardiogram examinations, most recent 03/18/2022. Mitral Valve Disease; Risk Factors:Hypertension. Mitra clip with two clips. PFO. Severe mitral regurgitation.  Mitral Valve: Mitra-Clip valve is present in the mitral position. Procedure Date: 02/15/2022.  Sonographer:    Tina West RDCS Referring Phys: [id number] KATHRYN R THOMPSON  PROCEDURE: After discussion of the risks and benefits of a TEE, an informed consent was obtained from the patient. TEE procedure time was 35 minutes. The transesophogeal probe was passed without difficulty through the esophogus of the patient. Imaged were obtained with the patient in a left lateral decubitus position. Sedation performed by different physician. The patient was monitored while under deep sedation. Anesthestetic sedation was provided intravenously by Anesthesiology: 150mg of Propofol, 50mg of Lidocaine. Image quality was excellent. The patient's vital signs; including heart rate, blood pressure, and oxygen saturation; remained stable throughout the procedure. The patient developed no complications during the procedure.  IMPRESSIONS   1. S/p mitraclip with 2 XTW clips in the A2-P2 position. There is recurrent severe eccentric mitral regurgitation related to a persistent flail A2 segment. Suspect the mechanism is the flail segment is no longer  fully captured. The jet is now lateral to the current clips and posteriorly directed. The clips are attached without detachment. MG 5-6 mmHG @ 65 bpm. The lateral orifice is 3.10 cm2 and the medial orifice is 1.61 cm2. The mitral valve has been repaired/replaced. Severe mitral valve regurgitation. Mild mitral stenosis. There is a Mitra-Clip present in the mitral position. Procedure Date: 02/15/2022. 2. Left ventricular ejection fraction, by estimation, is 65 to 70%. The left ventricle has normal function. 3. Right ventricular systolic function is normal. The right ventricular size is normal. 4. Left atrial size was severely dilated. No left atrial/left atrial appendage thrombus was detected. 5. 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 L to R shunting seen. Evidence of atrial level shunting detected by color flow Doppler. 6. Right atrial size was mild to moderately dilated. 7. The aortic valve is tricuspid. Aortic valve regurgitation is not visualized. No aortic stenosis is present.  FINDINGS Left Ventricle: Left ventricular ejection fraction, by estimation, is 65 to 70%. The left ventricle has normal function. The left ventricular internal cavity size was normal in size.  Right Ventricle: The right ventricular size is normal. No increase in right ventricular wall thickness. Right ventricular systolic function is normal.  Left Atrium: Left atrial size was severely dilated. No left atrial/left atrial appendage thrombus was detected.  Right Atrium: Right atrial size was mild to moderately dilated.  Pericardium: There is no evidence of pericardial effusion.  Mitral Valve:   S/p mitraclip with 2 XTW clips in the A2-P2 position. There is recurrent severe eccentric mitral regurgitation related to a persistent flail A2 segment. Suspect the mechanism is the flail segment is no longer fully captured. The jet is now lateral to the current clips and posteriorly directed. The clips are  attached without detachment. MG 5-6 mmHG @ 65 bpm. The lateral orifice is 3.10 cm2 and the medial orifice is 1.61 cm2. The mitral valve has been repaired/replaced. Severe mitral valve regurgitation. There is a Mitra-Clip present in the mitral position. Procedure Date: 02/15/2022. Mild mitral valve stenosis. MV peak gradient, 17.3 mmHg. The mean mitral valve gradient is 5.5 mmHg with average heart rate of 65 bpm.  Tricuspid Valve: The tricuspid valve is grossly normal. Tricuspid valve regurgitation is mild . No evidence of tricuspid stenosis.  Aortic Valve: The aortic valve is tricuspid. Aortic valve regurgitation is not visualized. No aortic stenosis is present.  Pulmonic Valve: The pulmonic valve was grossly normal. Pulmonic valve regurgitation is not visualized. No evidence of pulmonic stenosis.  Aorta: The aortic root and ascending aorta are structurally normal, with no evidence of dilitation. There is minimal (Grade I) layered plaque involving the descending aorta.  Venous: A pattern of systolic flow reversal, suggestive of severe mitral regurgitation is recorded from the left upper pulmonary vein, the left lower pulmonary vein, the right upper pulmonary vein and the right lower pulmonary vein.  IAS/Shunts: Evidence of atrial level shunting detected by color flow Doppler. 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 L to R shunting seen.    AORTA Ao Root diam: 3.26 cm Ao Asc diam:  3.80 cm  MITRAL VALVE MV Peak grad: 17.3 mmHg MV Mean grad: 5.5 mmHg MV Vmax:      2.08 m/s MV Vmean:     104.6 cm/s MR Peak grad:    102.4 mmHg MR Mean grad:    63.0 mmHg MR Vmax:         506.00 cm/s MR Vmean:        366.0 cm/s MR PISA:         18.16 cm MR PISA Eff ROA: 137 mm MR PISA Radius:  1.70 cm  [place] O'Neal MD Electronically signed by [address] O'Neal MD Signature Date/Time: 05/14/2022/3:00:05 PM    Final   MONITORS  LONG TERM MONITOR (3-14 DAYS)  04/12/2022  Narrative Patch Wear Time:  13 days and 19 hours (2023-04-27T14:23:18-0400 to 2023-05-11T09:37:39-0400)  Patient had a min HR of 52 bpm, max HR of 171 bpm, and avg HR of 67 bpm. Predominant underlying rhythm was Sinus Rhythm. 2 Ventricular Tachycardia runs occurred, the run with the fastest interval lasting 9 beats with a max rate of 167 bpm (avg 146 bpm); the run with the fastest interval was also the longest. 35 Supraventricular Tachycardia runs occurred, the run with the fastest interval lasting 6 beats with a max rate of 171 bpm, the longest lasting 19 beats with an avg rate of 109 bpm. Occasional PVCs with a burden of 1.7%.           Recent Labs: 03/23/2022: TSH 0.84 04/30/2022: NT-Pro BNP 403 09/25/2022: ALT 3 11/25/2022: B Natriuretic Peptide 247.6; Hemoglobin 12.3; Platelets 152 12/07/2022: BUN 17; Creatinine, Ser 0.79; Magnesium 2.2; Potassium 4.1; Sodium 137  Recent Lipid Panel    Component Value Date/Time   CHOL 171 08/08/2021 1320   TRIG 100.0 08/08/2021 1320   HDL 53.30 08/08/2021 1320   CHOLHDL 3 08/08/2021 1320   VLDL 20.0   08/08/2021 1320   LDLCALC 97 08/08/2021 1320   LDLDIRECT 135.0 08/16/2009 1359   Risk Assessment/Calculations:     STS surgical risk calculation:   Procedure Type: Isolated MVR PERIOPERATIVE OUTCOME ESTIMATE % Operative Mortality 8.42% Morbidity & Mortality 21.6% Stroke 2.51% Renal Failure 2.53% Reoperation 8.07% Prolonged Ventilation 10.9% Deep Sternal Wound Infection 0.044% Long Hospital Stay (>14 days) 12% Short Hospital Stay (<6 days)* 14.2%  Procedure Type: Isolated MV Repair PERIOPERATIVE OUTCOME ESTIMATE % Operative Mortality 4.89% Morbidity & Mortality 13.6% Stroke 5.21% Renal Failure 1.43% Reoperation 5.95% Prolonged Ventilation 6.57% Deep Sternal Wound Infection 0.037% Long Hospital Stay (>14 days) 8.08% Short Hospital Stay (<6 days)* 22.3%  Physical Exam:    VS:  BP 130/80   Pulse 66   Ht 5' 2" (1.575 m)    Wt 115 lb (52.2 kg)   SpO2 93%   BMI 21.03 kg/m     Wt Readings from Last 3 Encounters:  03/14/23 115 lb (52.2 kg)  02/11/23 113 lb (51.3 kg)  01/29/23 113 lb (51.3 kg)     GEN: Pleasant elderly woman in no acute distress HEENT: Normal NECK: No JVD; No carotid bruits LYMPHATICS: No lymphadenopathy CARDIAC: RRR, 3/6 holosystolic murmur at the apex RESPIRATORY:  Clear to auscultation without rales, wheezing or rhonchi  ABDOMEN: Soft, non-tender, non-distended MUSCULOSKELETAL:  No edema; No deformity  SKIN: Warm and dry NEUROLOGIC:  Alert and oriented x 3 PSYCHIATRIC:  Normal affect   ASSESSMENT:    1. Nonrheumatic mitral (valve) insufficiency    PLAN:    In order of problems listed above:  The patient has severe, grade 4+, symptomatic mitral regurgitation after initial treatment with transcatheter edge-to-edge repair of the mitral valve with 2 MitraClip XT W devices.  There is a large defect lateral to the indwelling clips, likely related to either a leaflet tear or progression of severe myxomatous disease.  We reviewed treatment options again at length.  She has NYHA functional class III symptoms and would really like to be treated if possible.  I think it is reasonable to consider another attempt at transcatheter repair with MitraClip, with plans to place a third device lateral to the indwelling clips.  There is concerned that she could have mitral stenosis created by this, but recent data suggest that patient still have clinical improvement even with higher gradients as long as the mitral regurgitation is reduced significantly.  As she would like to explore all of her treatment options, I am going to refer her to see Dr. Weldner for surgical evaluation for consideration of minimally invasive surgical mitral valve replacement.  She understands this procedure would have a much higher success rate at successful treatment of her mitral regurgitation, but that the recovery would  potentially be difficult for her.  We again reviewed the MitraClip procedure at length and if she is deemed a poor operative candidate, I would plan to offer her treatment with TEER.            Medication Adjustments/Labs and Tests Ordered: Current medicines are reviewed at length with the patient today.  Concerns regarding medicines are outlined above.  No orders of the defined types were placed in this encounter.  No orders of the defined types were placed in this encounter.   Patient Instructions  Surgical Evaluation (03/25/2023): You are scheduled with Dr. Weldner at [facility] (located in the [facility]) for further TAVR evaluation on 03/25/2023. Please arrive at 2:15PM for your appointment at 2:30PM. [address], [address]  [place], [place] [address] Phone: [phone]  Please call their office on the day of your appointment to make sure the surgeon will be there and on time as delays or rescheduling due to emergency surgery may occur.   Medication Instructions:  Your physician recommends that you continue on your current medications as directed. Please refer to the Current Medication list given to you today.  *If you need a refill on your cardiac medications before your next appointment, please call your pharmacy*   Lab Work: NONE If you have labs (blood work) drawn today and your tests are completely normal, you will receive your results only by: MyChart Message (if you have MyChart) OR A paper copy in the mail If you have any lab test that is abnormal or we need to change your treatment, we will call you to review the results.   Testing/Procedures: NONE   Follow-Up: At [facility], you and your health needs are our priority.  As part of our continuing mission to provide you with exceptional heart care, we have created designated Provider Care Teams.  These Care Teams include your primary Cardiologist (physician) and Advanced Practice  Providers (APPs -  Physician Assistants and Nurse Practitioners) who all work together to provide you with the care you need, when you need it.  Your next appointment:   Structural Team will follow-up  Provider:   Marilynn Ekstein, MD      Signed, Dilyn Smiles, MD  03/15/2023 12:36 PM    [facility]  

## 2023-03-14 NOTE — Progress Notes (Signed)
Care Management & Coordination Services Pharmacy Team  Reason for Encounter: Appointment Reminder  Contacted patient to confirm telephone appointment with Al Corpus , PharmD on 03/19/23 at 11:00. Unsuccessful outreach. Left voicemail for patient to return call. Wished patient Happy [place] Ouch    Have you seen any other providers since your last visit with PCP? Yes- cardiology,neurology   Hospital visits:  None in previous 6 months   Star Rating Drugs:  Medication:  Last Fill: Day Supply No star medications identified  Care Gaps: Annual wellness visit in last year? Yes  If Diabetic: Last eye exam / retinopathy screening: Last diabetic foot exam:   Al Corpus, PharmD notified  Burt Knack, [facility] Clinical Pharmacy Assistant [phone]

## 2023-03-15 ENCOUNTER — Encounter: Payer: Self-pay | Admitting: Cardiovascular Disease

## 2023-03-15 DIAGNOSIS — F419 Anxiety disorder, unspecified: Secondary | ICD-10-CM | POA: Diagnosis not present

## 2023-03-15 DIAGNOSIS — G20A1 Parkinson's disease without dyskinesia, without mention of fluctuations: Secondary | ICD-10-CM | POA: Diagnosis not present

## 2023-03-15 DIAGNOSIS — J449 Chronic obstructive pulmonary disease, unspecified: Secondary | ICD-10-CM | POA: Diagnosis not present

## 2023-03-15 DIAGNOSIS — Z96642 Presence of left artificial hip joint: Secondary | ICD-10-CM | POA: Diagnosis not present

## 2023-03-15 DIAGNOSIS — S72002D Fracture of unspecified part of neck of left femur, subsequent encounter for closed fracture with routine healing: Secondary | ICD-10-CM | POA: Diagnosis not present

## 2023-03-15 DIAGNOSIS — R2689 Other abnormalities of gait and mobility: Secondary | ICD-10-CM | POA: Diagnosis not present

## 2023-03-15 DIAGNOSIS — R2681 Unsteadiness on feet: Secondary | ICD-10-CM | POA: Diagnosis not present

## 2023-03-15 DIAGNOSIS — M6281 Muscle weakness (generalized): Secondary | ICD-10-CM | POA: Diagnosis not present

## 2023-03-15 DIAGNOSIS — I5033 Acute on chronic diastolic (congestive) heart failure: Secondary | ICD-10-CM | POA: Diagnosis not present

## 2023-03-18 DIAGNOSIS — Z96642 Presence of left artificial hip joint: Secondary | ICD-10-CM | POA: Diagnosis not present

## 2023-03-19 ENCOUNTER — Encounter: Payer: Medicare PPO | Admitting: Pharmacist

## 2023-03-19 ENCOUNTER — Telehealth: Payer: Self-pay | Admitting: Family Medicine

## 2023-03-19 ENCOUNTER — Telehealth: Payer: Self-pay | Admitting: Pharmacist

## 2023-03-19 NOTE — Telephone Encounter (Signed)
Care Management & Coordination Services Outreach Note  03/19/2023 Name: Crystal Bass MRN: [id number] DOB: 1941/04/26  Referred by: Eustaquio Boyden, MD  Patient had a phone appointment scheduled with clinical pharmacist today.  An unsuccessful telephone outreach was attempted today. The patient was referred to the pharmacist for assistance with medications, care management and care coordination.   Patient will NOT be penalized in any way for missing a Care Management & Coordination Services appointment. The no-show fee does not apply.  If possible, a message was left to return call to: [phone] or to [facility].  Al Corpus, PharmD, BCACP Clinical Pharmacist [facility] [phone]

## 2023-03-19 NOTE — Telephone Encounter (Signed)
Patient returned call to the office from [place], would like a call back whenever possible.

## 2023-03-19 NOTE — Telephone Encounter (Signed)
Rescheduled for 5/3.

## 2023-03-19 NOTE — Progress Notes (Unsigned)
Care Management & Coordination Services Pharmacy Note  12/19/2022 Name:  Crystal Bass MRN:  [id number] DOB:  January 30, 1941  Summary: F/U visit -HF: pt is compliant with furosemide as prescribed; she weighs herself daily, dry weight ~113 lbs, she has been 115 lbs the last 2 days and has taken an extra dose of furosemide yesterday; reviewed diuretic instructions per cardiology -COPD: pt uses Trelegy every day, though she does report she sometimes feels like she is not getting the full dose due to inability to take a large enough breath; discussed possibility of switching to an inhaler that is not reliant on patient's breath (Respimat, HFA), pt declined at she feels symptoms are under control currently -Osteoporosis: pt is up to date with Prolia injections; discussed fall risk at length in s/o sedating medications (clonazepam, gabapentin), currently she uses Rollator to get around  Recommendations/Changes made from today's visit: -Advised pt to call cardiology if wt is not back to dry weight over next 2-3 days -Advised pt to monitor BP at home and contact provider if BP is < 100/60 -No changes  Follow up plan: -Health Concierge will call patient 1 month for HF update -Pharmacist follow up televisit scheduled for 3 months -Neurology appt 01/29/23; HF clinic appt 02/11/23; Cardiology appt 04/19/23    Subjective: Crystal Bass is an 82 y.o. year old adult who is a primary patient of Eustaquio Boyden, MD.  The care coordination team was consulted for assistance with disease management and care coordination needs.    Engaged with patient face to face for initial visit. Patient lives independently at [facility]. She does drive herself to appts and to pick up prescriptions. Her meals are a mix of her own cooking and the facility's meals. She sets up medications each day in pill cups. She uses a rollator to get around her home most of the time, occasionally will walk without it.  Recent office  visits: 12/07/22 Dr Sharen Hones OV: hospital f/u - update Lasix to 40 mg once daily.[name] BMP stable. Referred to PT. Reschedule CHF clinic appt.  09/25/22-Javier Gutierrez,MD(PCP)-f/u left hip fracture.labs,( anemia is improving with hgb up to 11.4. Vitamin D level returned low - rec start 1000 IU daily OTC. Vitamin B12 borderline low normal - consider OTC b12 supplement.Kidneys, liver returned normal,Your urine test returned reassuringly normal today).  09/21/22-Javier Gutierrez,MD(PCP)-telemedicine-urinary frequency-colect urine sample and bring in. Rx keflex 500mg  TID 5d course.   Recent consult visits: 03/14/23 Dr Excell Seltzer (Cardiology): mitral regurg - could proceed with mitraclip repair. Refer to Dr Brennan Bailey for surgical eval to consider MVR (higher success rate, harder recovery)  02/11/23 NP Julien Girt (Cardiology): mitral regurg - pt cancelled TEER procedure. Now with NYHA III/IV symptoms - see Dr Excell Seltzer.   01/29/23 Dr Tat (Neurology): Diplopia, Parkinsons - continue Sinemet CR. Hx orthostasis - pt declined to stop amlodipine d/t aortic aneurysm. Discussed permissive HTN concept.  12/11/22 Dr Kirke Corin (Cardiology): f/u - updated lasix 40 mg rx - may take extra 1/2 tab in PM for wt gain 3+ lbs in 48h or 5+ lbs in a week 09/11/22-Christopher Berge,NP(cardio)-f/u mitraclip-no medication changes  09/05/22- [facility]- no data 09/03/22-Tina Hackney,NP(cardio)-f/u shortness of breath,document daily weights,no medication changes, f/u 6 weeks 08/09/22-Muhammad Arida,MD(cardio)-f/u mitral valve regurgitation,no medication changes f/u 4 months 07/17/22-[facility]- no data   Hospital visits: 11/21/22 - 11/26/22 Admission [facility]): acute CHF exacerbation. IV lasix. Discharged on higher dose of Lasix  - 20 mg BID.  09/22/22 ED visit [facility]): L hip pain. S/p L hip surgery 08/21/22,  discharged to SNF. Given lidocaine patch in ED.   08/20/22 - 08/24/22 Admission [facility]): closed L hip fracture d/t fall.   Objective:  Lab  Results  Component Value Date   CREATININE 0.79 12/07/2022   BUN 17 12/07/2022   GFR 70.99 12/07/2022   EGFR 84 04/30/2022   GFRNONAA >60 11/25/2022   GFRAA 66 12/23/2018   NA 137 12/07/2022   K 4.1 12/07/2022   CALCIUM 9.4 12/07/2022   CO2 28 12/07/2022   GLUCOSE 87 12/07/2022    Lab Results  Component Value Date/Time   HGBA1C 5.3 01/14/2018 11:21 AM   HGBA1C 5.4 05/19/2014 03:58 PM   GFR 70.99 12/07/2022 12:23 PM   GFR 82.19 09/25/2022 01:40 PM   MICROALBUR 7.1 (H) 01/22/2020 01:09 PM    Last diabetic Eye exam: No results found for: "HMDIABEYEEXA"  Last diabetic Foot exam: No results found for: "HMDIABFOOTEX"   Lab Results  Component Value Date   CHOL 171 08/08/2021   HDL 53.30 08/08/2021   LDLCALC 97 08/08/2021   LDLDIRECT 135.0 08/16/2009   TRIG 100.0 08/08/2021   CHOLHDL 3 08/08/2021       Latest Ref Rng & Units 12/07/2022   12:23 PM 09/25/2022    1:40 PM 06/15/2022   11:13 AM  Hepatic Function  Total Protein 6.0 - 8.3 g/dL  7.2  7.3   Albumin 3.5 - 5.2 g/dL 4.1  3.9  4.0   AST 0 - 37 U/L  13  16   ALT 0 - 35 U/L  3  <5   Alk Phosphatase 39 - 117 U/L  82  62   Total Bilirubin 0.2 - 1.2 mg/dL  1.1  1.7   Bilirubin, Direct 0.0 - 0.3 mg/dL  0.2      Lab Results  Component Value Date/Time   TSH 0.84 03/23/2022 12:21 PM   TSH 1.893 10/26/2018 12:23 PM   TSH 1.52 10/23/2017 12:26 PM   FREET4 0.93 02/09/2015 08:53 AM       Latest Ref Rng & Units 11/25/2022    4:58 AM 11/24/2022    4:32 AM 11/23/2022    4:31 AM  CBC  WBC 4.0 - 10.5 K/uL 5.1  5.1  5.6   Hemoglobin 12.0 - 15.0 g/dL 16.1  09.6  04.5   Hematocrit 36.0 - 46.0 % 39.3  39.6  37.8   Platelets 150 - 400 K/uL 152  146  148     Lab Results  Component Value Date/Time   VD25OH 23.69 (L) 09/25/2022 01:40 PM   VD25OH 29.58 (L) 08/08/2021 01:20 PM   VITAMINB12 350 09/25/2022 01:40 PM   VITAMINB12 384 08/08/2021 01:20 PM    Clinical ASCVD: Yes  - aortic atherosclerosis The ASCVD Risk score  (Arnett DK, et al., 2019) failed to calculate for the following reasons:   The 2019 ASCVD risk score is only valid for ages 84 to 26       12/07/2022   11:43 AM 09/03/2022    2:04 PM 08/13/2022    1:11 PM  Depression screen PHQ 2/9  Decreased Interest 0 0 1  Down, Depressed, Hopeless 0 0 1  PHQ - 2 Score 0 0 2  Altered sleeping   0  Tired, decreased energy   0  Change in appetite   0  Feeling bad or failure about yourself    0  Trouble concentrating   0  Moving slowly or fidgety/restless   0  Suicidal thoughts   0  PHQ-9 Score   2  Difficult doing work/chores  Not difficult at all Not difficult at all     Social History   Tobacco Use  Smoking Status Never  Smokeless Tobacco Never   BP Readings from Last 3 Encounters:  03/14/23 130/80  02/11/23 120/70  01/29/23 114/62   Pulse Readings from Last 3 Encounters:  03/14/23 66  02/11/23 72  01/29/23 (!) 50   Wt Readings from Last 3 Encounters:  03/14/23 115 lb (52.2 kg)  02/11/23 113 lb (51.3 kg)  01/29/23 113 lb (51.3 kg)   BMI Readings from Last 3 Encounters:  03/14/23 21.03 kg/m  02/11/23 20.67 kg/m  01/29/23 20.67 kg/m    Allergies  Allergen Reactions   Sulfa Antibiotics Anaphylaxis   Sulfonamide Derivatives Anaphylaxis   Topamax [Topiramate] Other (See Comments)    Metabolic acidosis    Clarithromycin Other (See Comments)    pericarditis Other reaction(s): Not available   Hydrocodone Other (See Comments)    "hyper and climbing the walls"   Motrin [Ibuprofen] Other (See Comments)    headaches   Misc. Sulfonamide Containing Compounds     Other reaction(s): Not available   Symbicort [Budesonide-Formoterol Fumarate] Other (See Comments)    02/07/15 tremor   Tetracycline Hcl Other (See Comments)   Percocet [Oxycodone-Acetaminophen] Itching and Rash   Tape Itching and Rash    Use paper tape only   Tetracyclines & Related Other (See Comments)    "immediate yeast infection"    Medications Reviewed Today      Reviewed by Tonny Bollman, MD (Physician) on 03/15/23 at 1231  Med List Status: <None>   Medication Order Taking? Sig Documenting Provider Last Dose Status Informant  acetaminophen (TYLENOL) 500 MG tablet [id number] Yes Take 1,000 mg by mouth every 12 (twelve) hours as needed. [provider] Taking Active Self  albuterol (VENTOLIN HFA) 108 (90 Base) MCG/ACT inhaler [id number] Yes Inhale 2 puffs into the lungs every 6 (six) hours as needed for wheezing or shortness of breath. Eustaquio Boyden, MD Taking Active Self  amLODipine (NORVASC) 2.5 MG tablet [id number] Yes Take 1 tablet (2.5 mg total) by mouth daily. Iran Ouch, MD Taking Active Self  aspirin 81 MG tablet [id number] Yes Take 1 tablet (81 mg total) by mouth daily. Eustaquio Boyden, MD Taking Active Self  calcium carbonate (TUMS - DOSED IN MG ELEMENTAL CALCIUM) 500 MG chewable tablet [id number] Yes Chew 1 tablet by mouth as needed for indigestion or heartburn. [provider] Taking Active   Carbidopa-Levodopa ER (SINEMET CR) 25-100 MG tablet controlled release [id number] Yes TAKE TWO TABLET BY MOUTH AT NINE IN THE MORNING, ONE TAB AT ONE IN THE EVENING AND ONE TAB AT FIVE IN THE EVENING Tat, Octaviano Batty, DO Taking Active   Cholecalciferol (VITAMIN D3) 25 MCG (1000 UT) CAPS [id number] Yes Take 1 capsule (1,000 Units total) by mouth daily. Eustaquio Boyden, MD Taking Active Self           Med Note Robyne Askew Dec 20, 2022 10:53 AM)    clonazePAM (KLONOPIN) 0.5 MG tablet [id number] Yes TAKE ONE HALF (1/2) A TABLET BY MOUTH AT BEDTIME Tat, Octaviano Batty, DO Taking Active   EPINEPHrine 0.3 mg/0.3 mL IJ SOAJ injection [id number] Yes Inject 0.3 mg into the muscle as needed for anaphylaxis. [provider] Taking Active Self           Med Note [facility], Carleene Overlie Mar 19, 2022 12:36  PM)    FEROSUL 325 (65 Fe) MG tablet [id number] Yes TAKE ONE TABLET BY MOUTH DAILY WITH Antony Madura, MD Taking Active   fexofenadine [facility]) 180 MG tablet [id number] Yes Take 180 mg by mouth daily. [provider] Taking Active Self  Fluticasone-Umeclidin-Vilant (TRELEGY ELLIPTA) 100-62.5-25 MCG/ACT AEPB [id number] Yes Inhale 1 puff into the lungs daily. Noemi Chapel, NP Taking Active Self  furosemide (LASIX) 40 MG tablet [id number] Yes Take 1 tablet (40 mg total) by mouth daily. May take an extra 20 mg in the afternoon for a weight gain of 3 lbs in 48 hours or 5 lbs in a week Iran Ouch, MD Taking Active   gabapentin ([name]) 300 MG capsule [id number] Yes TAKE TWO CAPSULES BY MOUTH THREE TIMES DAILY Eustaquio Boyden, MD Taking Active   pantoprazole (PROTONIX) 20 MG tablet [id number] Yes Take 20 mg by mouth daily. [provider] Taking Active Self  potassium chloride SA (KLOR-CON M) 20 MEQ tablet [id number] Yes TAKE ONE TABLET BY MOUTH ONCE A DAY Iran Ouch, MD Taking Active             SDOH:  (Social Determinants of Health) assessments and interventions performed: No SDOH Interventions    Flowsheet Row Telephone from 11/28/2022 in [facility] Community Care Coordination ED to Hosp-Admission (Discharged) from 11/21/2022 in [facility] REGIONAL CARDIAC MED PCU Clinical Support from 08/13/2022 in [facility] Cardiac Rehab from 05/09/2022 in [facility] Cardiac and Pulmonary Rehab Cardiac Rehab from 03/21/2022 in [facility] Cardiac and Pulmonary Rehab Clinical Support from 08/05/2021 in [facility] [place]  SDOH Interventions        Food Insecurity Interventions Intervention Not Indicated -- Intervention Not Indicated -- -- Intervention Not Indicated  Housing Interventions Intervention Not Indicated -- Intervention Not Indicated -- -- Intervention Not Indicated  Transportation Interventions Intervention Not Indicated Intervention Not Indicated Intervention Not Indicated -- -- Intervention Not  Indicated  Utilities Interventions -- -- Intervention Not Indicated -- -- --  Alcohol Usage Interventions -- -- Intervention Not Indicated (Score <7) -- -- --  Depression Interventions/Treatment  -- -- -- PHQ2-9 Score <4 Follow-up Not Indicated Counseling --  Financial Strain Interventions -- -- Intervention Not Indicated -- -- --  Physical Activity Interventions -- -- Intervention Not Indicated -- -- Other (Comments)  [due to recent fall and recovering from that]  Stress Interventions -- -- Intervention Not Indicated -- -- Intervention Not Indicated  Social Connections Interventions -- -- Intervention Not Indicated -- -- Intervention Not Indicated       Medication Assistance: None required.  Patient affirms current coverage meets needs.  Medication Access: Within the past 30 days, how often has patient missed a dose of medication? 0 Is a pillbox or other method used to improve adherence? Yes  Factors that may affect medication adherence? no barriers identified Are meds synced by current pharmacy? No  Are meds delivered by current pharmacy? No  Does patient experience delays in picking up medications due to transportation concerns? No   [facility] Services Reviewed: Is patient disadvantaged to use [facility]?: No  Current Rx insurance plan: [employer] Name and location of Current pharmacy:  [facility] - [place], [place] - [address] [place] [address] Phone: [phone] Fax: [phone]  [facility] - [place], [place] - [address] [place] [address] Phone: [phone] Fax: [phone]  [facility] services reviewed with  patient today?: No  Patient requests to transfer care to [facility]?: No  Reason patient declined to change pharmacies: Loyalty to other pharmacy/Patient preference  Compliance/Adherence/Medication fill history: Care Gaps: Mammogram (due  05/2022)  Star-Rating Drugs: None   Assessment/Plan   Hyperlipidemia: (LDL goal < 70) -Not ideally controlled - LDL 97 (07/2021), given aortic atherosclerosis on CT ideal LDL goal is < 70; pt denies hx of bleeding on aspirin -Current treatment: Aspirin 81 mg daily - Appropriate, Effective, Safe, Accessible -Medications previously tried: none  -Current dietary patterns: 2-3 meals per day -Current exercise habits: walks around her home -Educated on Cholesterol goals;  -Recommend to update lipid panel at next PCP appt; consider statin if LDL is > 70  Heart Failure (Goal: BP < 140/90) -Not ideally controlled - pt reports swelling in her ankles today and reports her weight is up 2 lbs for the past 2 days; she did take an extra Lasix yesterday, as instructed in recent cardiology appt; she reports the extra dose did not lead to increased urination -Last ejection fraction: 60-65% (Date: 07/2022) -HF type: HFpEF (EF > 50%) - grade II diastolic dysfunction -NYHA Class: III (marked limitation of activity) -Current home BP/HR readings: none available - BP monitor is out of batteries -Current home daily weights: 115# today (dry weight is ~113#) -Current treatment: Amlodipine 2.5 mg daily - Appropriate, Effective, Safe, Accessible Furosemide 40 mg daily + extra 1/2 tab PRN Klor-Con 20 mEq daily -Medications previously tried: bisoprolol, losartan, metoprolol -Current dietary habits: pt avoids salt, she reads nutrition labels to limit sodium -Current exercise habits: limited, pt uses rollator to get around her home -Educated on Benefits of medications for managing symptoms and prolonging life; Importance of weighing daily; if you gain more than 3 pounds in one day or 5 pounds in one week, take an extra Lasix 20 mg; Importance of blood pressure control -Recommended to continue current medication; advised pt to call cardiology if wt is not back to dry weight by Friday 1/26  COPD/asthma (Goal: control  symptoms and prevent exacerbations) -Controlled - pt uses Trelegy every day, though she does report she sometimes feels like she is not getting the full dose due to inability to take a large enough breath -Gold Grade: Gold 2 (FEV1 50-79%) -Current COPD Classification:  A (low sx, 0-1 moderate exacerbations, no hospitalizations) -Pulmonary function testing: 12/2018 - FEV1 77% pred; FEV1/FVC 0.77; FEV1 9% change -Exacerbations requiring treatment in last 6 months: 0 -Follows with pulmonology (NP Micheline Maze) -Current treatment  Albuterol HFA prn - Appropriate, Effective, Safe, Accessible Trelegy 100-62.5-25 mcg/act 1 puff daily - Appropriate, Query Effective -Medications previously tried: Clinical cytogeneticist (ineffective) -Patient reports consistent use of maintenance inhaler -Frequency of rescue inhaler use: rare -Counseled on Proper inhaler technique; Benefits of consistent maintenance inhaler use -Discussed possibility of switching to an inhaler that is not reliant on patient's breath (Respimat, HFA); she has tried Clinical cytogeneticist in the past and thinks it was not as effective as Trelegy; since breathing is under relative good control, will continue with Trelegy but may consider Breztri in future if propellant-inhaler is needed -Recommended to continue current medication  Osteoporosis (Goal prevent fractures) -Stable - pt is compliant with Prolia injections -s/p hip fracture 07/2022 -Last DEXA Scan: 04/06/22   T-Score femoral neck: -2.5  T-Score total hip: -2.2  T-Score lumbar spine: -1.8  T-Score forearm radius: -4.0 -Patient is a candidate for pharmacologic treatment due to history of hip fracture  and T-Score < -2.5 in femoral  neck -Current treatment  Prolia - started 05/2020; last dose 10/30/22 - Appropriate, Effective, Safe, Accessible Vitamin D 1000 IU daily - Appropriate, Effective, Safe, Accessible -Medications previously tried: alendronate (~2012)  -Recommend weight-bearing and muscle strengthening  exercises for building and maintaining bone density. -Discussed fall risk at length - advised to use rollator at all times at home; when standing ensure balance/stability before moving -Recommended to continue current medication  Parkinsons Disease (Goal: manage symptoms) -Controlled, stable; pt has Rx for ER version, not IR. Updated med list. -Pt reports clonazepam has helped tremendously with nightmares that she developed as a side effect of Levodopa -Follows with neurology (Dr Tat) -Current treatment  Carbidope-levodopa ER 25-100 mg TID - 9a, 1p, 5p - Appropriate, Effective, Safe, Accessible Clonazepam 0.5 mg HS - Appropriate, Effective, Safe, Accessible -Medications previously tried: n/a  -Discussed fall risk at length - medications like clonazepam, gabapentin can cause sedation, drowsiness and falls -Recommended to continue current medication  Neuropathy (Goal: manage symptoms) -Not ideally controlled - pt reports she feels like neuropathy is getting worse; she describes nerve pain on her torso began after biopsies and mastectomy, now she endorses similar burning sensation in her feet -Current treatment  Gabapentin 300 mg - 2 cap TID - Appropriate, Query Effective -Medications previously tried: none  -Discussed risks with increasing gabapentin - may not improve neuropathy and can increase risk for sedation/falls -Discussed possibility of adding duloxetine, pt would like to continue with current medication -Recommended to continue current medication; trial Capsaicin cream on feet  GERD (Goal: minimize symptoms of reflux ) -Controlled - per pt report -Current treatment  Pantoprazole 20 mg daily - Appropriate, Effective, Safe, Accessible Tums -Appropriate, Effective, Safe, Accessible -Medications previously tried: none reported  -Recommended to continue current medication  Health Maintenance -Vaccine gaps: none -Mammogram due -Pt was taking Senna since hospitalization for hip  fracture 07/2022. She recently ran out and has not been taking it. She reports bowel movements are unchanged. Advised to monitor BM closely and if she experiences more constipation, she can restart senna.   Al Corpus, PharmD, BCACP Clinical Pharmacist [facility] [phone]

## 2023-03-20 DIAGNOSIS — S72002D Fracture of unspecified part of neck of left femur, subsequent encounter for closed fracture with routine healing: Secondary | ICD-10-CM | POA: Diagnosis not present

## 2023-03-20 DIAGNOSIS — G20A1 Parkinson's disease without dyskinesia, without mention of fluctuations: Secondary | ICD-10-CM | POA: Diagnosis not present

## 2023-03-20 DIAGNOSIS — F419 Anxiety disorder, unspecified: Secondary | ICD-10-CM | POA: Diagnosis not present

## 2023-03-20 DIAGNOSIS — Z96642 Presence of left artificial hip joint: Secondary | ICD-10-CM | POA: Diagnosis not present

## 2023-03-20 DIAGNOSIS — I5033 Acute on chronic diastolic (congestive) heart failure: Secondary | ICD-10-CM | POA: Diagnosis not present

## 2023-03-20 DIAGNOSIS — R2689 Other abnormalities of gait and mobility: Secondary | ICD-10-CM | POA: Diagnosis not present

## 2023-03-20 DIAGNOSIS — J449 Chronic obstructive pulmonary disease, unspecified: Secondary | ICD-10-CM | POA: Diagnosis not present

## 2023-03-20 DIAGNOSIS — R2681 Unsteadiness on feet: Secondary | ICD-10-CM | POA: Diagnosis not present

## 2023-03-20 DIAGNOSIS — M6281 Muscle weakness (generalized): Secondary | ICD-10-CM | POA: Diagnosis not present

## 2023-03-22 DIAGNOSIS — I5033 Acute on chronic diastolic (congestive) heart failure: Secondary | ICD-10-CM | POA: Diagnosis not present

## 2023-03-22 DIAGNOSIS — R2689 Other abnormalities of gait and mobility: Secondary | ICD-10-CM | POA: Diagnosis not present

## 2023-03-22 DIAGNOSIS — S72002D Fracture of unspecified part of neck of left femur, subsequent encounter for closed fracture with routine healing: Secondary | ICD-10-CM | POA: Diagnosis not present

## 2023-03-22 DIAGNOSIS — F419 Anxiety disorder, unspecified: Secondary | ICD-10-CM | POA: Diagnosis not present

## 2023-03-22 DIAGNOSIS — R2681 Unsteadiness on feet: Secondary | ICD-10-CM | POA: Diagnosis not present

## 2023-03-22 DIAGNOSIS — J449 Chronic obstructive pulmonary disease, unspecified: Secondary | ICD-10-CM | POA: Diagnosis not present

## 2023-03-22 DIAGNOSIS — Z96642 Presence of left artificial hip joint: Secondary | ICD-10-CM | POA: Diagnosis not present

## 2023-03-22 DIAGNOSIS — M6281 Muscle weakness (generalized): Secondary | ICD-10-CM | POA: Diagnosis not present

## 2023-03-22 DIAGNOSIS — G20A1 Parkinson's disease without dyskinesia, without mention of fluctuations: Secondary | ICD-10-CM | POA: Diagnosis not present

## 2023-03-24 NOTE — Progress Notes (Unsigned)
[address]       [place] [address]             [phone]           KILEY SOLIMINE [facility] Medical Record #[id number] Date of Birth: 21-Jun-1941  Tonny Bollman, MD Eustaquio Boyden, MD  Chief Complaint: Mitral regurgitation    History of Present Illness:     Pt is a pleasant 82 yo female with parkinsons disease and frailty with what she feels are "brittle bones" who has progressive DOE with occurring with minimal activity. Pt has had a previous mitral clip procedure with initial improvement but then recurrent severe MR. (Pt felt to have barlows pathology) Due to her symptoms and recurrent MR she was considered for repeat clip but with concern for leaving with MS or that it might recurr again, pt sought other options and the procedure was canceled. A consideration for mini thorocotomy approach was offered and pt here to discuss that option. PT has had a previous Cath without PHTN and no CAD. Recent echo with severe MR, preserved EF and Right systolic pressure of      Past Medical History:  Diagnosis Date   Aneurysm of aorta (HCC)    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   Anginal pain (HCC)    a. 10/2021 Cath: Nl cors.   Aortic root aneurysm (HCC)    Arthritis    Asthma    Breast cancer (HCC) 2002   infiltrative ductal carcinoma    Cataract 2019   Chronic heart failure with preserved ejection fraction (HFpEF) (HCC)    COPD (chronic obstructive pulmonary disease) (HCC) 07/2007   Ductal carcinoma of breast, estrogen receptor positive, stage 1 (HCC) 09/16/2012   Endometriosis    GERD (gastroesophageal reflux disease)    Hypercalcemia 09/14/2013   Hypertension    Lesion of breast 1992   right, benign   Lung disease    secondary to [name] infection   Mastalgia 05/1994   Mitral regurgitation    a. 10/2021 RHC: PCWP V wave of 34; b. 01/2022 TEE: Severe flail/prolapse of large area of A2 scallop with severe posteriorly directed MR; c. 01/2022  TEER w /Mitraclip XTW x 2; d. 07/2022 Echo: EF 60-65%. No rwma, mild LVH, GrIII DD. Sev dil LA. Mod elev PASP. Mod-Sev MR. Mod MS.   Osteoporosis, post-menopausal 03/14/2012   Ovarian tumor of borderline malignancy, right 2004   Palpitations    a. 02/2022 Zio: Predominantly sinus rhythm at 67 (52-171).  2 runs of NSVT -fastest 9 beats at 167.  35 SVT runs-longest 19 beats, fastest 171x6 beats.   Pancreatitis    secondary to Cholelithiasis   Parkinson disease 02/10/2015   Post-thoracotomy pain syndrome    Pseudomonas pneumonia (HCC) 03/2008   Status post implantation of mitral valve leaflet clip 02/15/2022   s/p XTW MitraClip x2 by Dr. Excell Seltzer in the A2/P2 position   Traumatic closed fracture of distal clavicle with minimal displacement, left, initial encounter 06/2021   [facility]   Uterine fibroid    Vitamin D deficiency     Past Surgical History:  Procedure Laterality Date   ABDOMINAL HYSTERECTOMY     & BSO for Mucinous borderline tumor of R ovary 2004   APPENDECTOMY  2004   BREAST BIOPSY  08/2004   right breast-benign   BREAST LUMPECTOMY  1992   benign   BUBBLE STUDY  12/27/2021   Procedure: BUBBLE STUDY;  Surgeon: Sande Rives, MD;  Location: [facility] ENDOSCOPY;  Service: Cardiovascular;;   CARDIAC CATHETERIZATION  2007   essentially negation for significant CAD   CHOLECYSTECTOMY  2001   COLONOSCOPY  2014   Dr Marina Goodell , due 2019   COLONOSCOPY  09/2018   2 TA removed, int hem, no f/u needed (Dr Marina Goodell)    ESOPHAGOGASTRODUODENOSCOPY  09/2018   GERD with esophagitis and stricture dilated, antral gastritis negative for H pylori Marina Goodell)   HIP ARTHROPLASTY Left 08/21/2022   Procedure: ARTHROPLASTY BIPOLAR HIP (HEMIARTHROPLASTY);  Surgeon: Juanell Fairly, MD;  Location: [facility] ORS;  Service: Orthopedics;  Laterality: Left;   LUNG BIOPSY  2002   [name], Dr Edwyna Shell   MASTECTOMY MODIFIED RADICAL Left 2002   oral chemotheraphy (tamoxifen then Armidex) no radiation, Dr.[name]    MITRAL VALVE REPAIR N/A 02/15/2022   Procedure: MITRAL VALVE REPAIR;  Surgeon: Tonny Bollman, MD;  Location: [facility] INVASIVE CV LAB;  Service: Cardiovascular;  Laterality: N/A;   RIGHT/LEFT HEART CATH AND CORONARY ANGIOGRAPHY Bilateral 11/14/2021   Procedure: RIGHT/LEFT HEART CATH AND CORONARY ANGIOGRAPHY;  Surgeon: Iran Ouch, MD;  Location: [facility] INVASIVE CV LAB;  Service: Cardiovascular;  Laterality: Bilateral;   TEE WITHOUT CARDIOVERSION N/A 12/29/2018   Procedure: TRANSESOPHAGEAL ECHOCARDIOGRAM (TEE);  Surgeon: Iran Ouch, MD;  Location: [facility] ORS;  Service: Cardiovascular;  Laterality: N/A;   TEE WITHOUT CARDIOVERSION N/A 12/27/2021   Procedure: TRANSESOPHAGEAL ECHOCARDIOGRAM (TEE);  Surgeon: Sande Rives, MD;  Location: [facility] ENDOSCOPY;  Service: Cardiovascular;  Laterality: N/A;   TEE WITHOUT CARDIOVERSION N/A 02/15/2022   Procedure: TRANSESOPHAGEAL ECHOCARDIOGRAM (TEE);  Surgeon: Tonny Bollman, MD;  Location: [facility] INVAS[facility] CV LAB;  Service: Cardiovascular;  Laterality: N/A;   TEE WITHOUT CARDIOVERSION N/A 05/14/2022   Procedure: TRANSESOPHAGEAL ECHOCARDIOGRAM (TEE);  Surgeon: Sande Rives, MD;  Location: [facility] ENDOSCOPY;  Service: Cardiovascular;  Laterality: N/A;   TONSILLECTOMY  1946    Social History   Tobacco Use  Smoking Status Never  Smokeless Tobacco Never    Social History   Substance and Sexual Activity  Alcohol Use No   Alcohol/week: 0.0 standard drinks of alcohol    Social History   Socioeconomic History   Marital status: Widowed    Spouse name: Not on file   Number of children: 0   Years of education: Not on file   Highest education level: Not on file  Occupational History   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, 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, 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, 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  Tobacco Use   Smoking status: Never   Smokeless tobacco: Never  Vaping Use   Vaping Use: Never used  Substance and Sexual Activity   Alcohol use: No    Alcohol/week: 0.0 standard  drinks of alcohol   Drug use: No   Sexual activity: Not Currently    Birth control/protection: Surgical    Comment: TAH/BSO  Other Topics Concern   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    Aunt of Dr Gilmore Laroche    Lives independent living at [facility]    No children    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, 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, 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, 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)    Activity: walking about 1 mile/day, enjoys [employer]    Diet: some water, fruits/vegetables some    Right handed    Social Determinants of Health   Financial Resource Strain: Low Risk  (08/13/2022)   Overall Financial Resource Strain (CARDIA)    Difficulty of Paying Living Expenses: Not hard at all  Food Insecurity:  No Food Insecurity (11/28/2022)   Hunger Vital Sign    Worried About Running Out of Food in the Last Year: Never true    Ran Out of Food in the Last Year: Never true  Transportation Needs: No Transportation Needs (11/28/2022)   PRAPARE - Administrator, Civil Service (Medical): No    Lack of Transportation (Non-Medical): No  Recent Concern: Transportation Needs - Unmet Transportation Needs (11/22/2022)   PRAPARE - Transportation    Lack of Transportation (Medical): No    Lack of Transportation (Non-Medical): Yes  Physical Activity: Insufficiently Active (08/13/2022)   Exercise Vital Sign    Days of Exercise per Week: 4 days    Minutes of Exercise per Session: 30 min  Stress: No Stress Concern Present (08/13/2022)   [employer] of Occupational Health - Occupational Stress Questionnaire    Feeling of Stress : Not at all  Social Connections: Socially Isolated (08/13/2022)   Social Connection and Isolation Panel [NHANES]    Frequency of Communication with Friends and Family: More than three times a week    Frequency of Social Gatherings with Friends and Family: Once a week    Attends Religious Services: Never    Database 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 or Organizations: No    Attends Banker Meetings: Never     Marital Status: Widowed  Intimate Partner Violence: Not At Risk (11/22/2022)   Humiliation, Afraid, Rape, and Kick questionnaire    Fear of Current or Ex-Partner: No    Emotionally Abused: No    Physically Abused: No    Sexually Abused: No    Allergies  Allergen Reactions   Sulfa Antibiotics Anaphylaxis   Sulfonamide Derivatives Anaphylaxis   Topamax [Topiramate] Other (See Comments)    Metabolic acidosis    Clarithromycin Other (See Comments)    pericarditis Other reaction(s): Not available   Hydrocodone Other (See Comments)    "hyper and climbing the walls"   Motrin [Ibuprofen] Other (See Comments)    headaches   Misc. Sulfonamide Containing Compounds     Other reaction(s): Not available   Symbicort [Budesonide-Formoterol Fumarate] Other (See Comments)    02/07/15 tremor   Tetracycline Hcl Other (See Comments)   Percocet [Oxycodone-Acetaminophen] Itching and Rash   Tape Itching and Rash    Use paper tape only   Tetracyclines & Related Other (See Comments)    "immediate yeast infection"    Current Outpatient Medications  Medication Sig Dispense Refill   acetaminophen (TYLENOL) 500 MG tablet Take 1,000 mg by mouth every 12 (twelve) hours as needed.     albuterol (VENTOLIN HFA) 108 (90 Base) MCG/ACT inhaler Inhale 2 puffs into the lungs every 6 (six) hours as needed for wheezing or shortness of breath. 18 g 1   amLODipine (NORVASC) 2.5 MG tablet Take 1 tablet (2.5 mg total) by mouth daily. 90 tablet 3   aspirin 81 MG tablet Take 1 tablet (81 mg total) by mouth daily. 30 tablet    calcium carbonate (TUMS - DOSED IN MG ELEMENTAL CALCIUM) 500 MG chewable tablet Chew 1 tablet by mouth as needed for indigestion or heartburn.     Carbidopa-Levodopa ER (SINEMET CR) 25-100 MG tablet controlled release TAKE TWO TABLET BY MOUTH AT NINE IN THE MORNING, ONE TAB AT ONE IN THE EVENING AND ONE TAB AT FIVE IN THE EVENING 360 tablet 1   Cholecalciferol (VITAMIN D3) 25 MCG (1000 UT) CAPS Take 1  capsule (1,000 Units total) by mouth daily. 30 capsule  clonazePAM (KLONOPIN) 0.5 MG tablet TAKE ONE HALF (1/2) A TABLET BY MOUTH AT BEDTIME 15 tablet 5   EPINEPHrine 0.3 mg/0.3 mL IJ SOAJ injection Inject 0.3 mg into the muscle as needed for anaphylaxis.     FEROSUL 325 (65 Fe) MG tablet TAKE ONE TABLET BY MOUTH DAILY WITH BREAKFAST 90 tablet 1   fexofenadine (ALLEGRA) 180 MG tablet Take 180 mg by mouth daily.     Fluticasone-Umeclidin-Vilant (TRELEGY ELLIPTA) 100-62.5-25 MCG/ACT AEPB Inhale 1 puff into the lungs daily. 1 each 6   furosemide (LASIX) 40 MG tablet Take 1 tablet (40 mg total) by mouth daily. May take an extra 20 mg in the afternoon for a weight gain of 3 lbs in 48 hours or 5 lbs in a week 30 tablet 3   gabapentin ([name]) 300 MG capsule TAKE TWO CAPSULES BY MOUTH THREE TIMES DAILY 540 capsule 1   pantoprazole (PROTONIX) 20 MG tablet Take 20 mg by mouth daily.     potassium chloride SA (KLOR-CON M) 20 MEQ tablet TAKE ONE TABLET BY MOUTH ONCE A DAY 60 tablet 1   No current facility-administered medications for this visit.     Family History  Problem Relation Age of Onset   Emphysema Mother        d.64 was never a smoker   Lung cancer Father 43       d.82 history of smoking   Breast cancer Sister 36   Colon cancer Sister    Melanoma Maternal Aunt    Liver cancer Maternal Aunt    Cancer Maternal Uncle        unspecified type   Cancer Maternal Uncle        unspecified type   Cancer Maternal Uncle        unspecified type   Stroke Paternal Aunt        in mid 59s   Osteoporosis Paternal Aunt    Myasthenia gravis Paternal Aunt    Cancer Maternal Grandmother        d.82s unspecified GI cancer   Cancer Maternal Grandfather        d.62s unspecified type   Breast cancer Cousin        d.60s-daughter of unaffected paternal aunt Doristine Church   Colon cancer Cousin 60       d.70-daughter of unaffected maternal aunt Leila   Lung cancer Cousin 70       d.70-sisters to each other,  both daughters of maternal uncle Johnny   Parkinson's disease Cousin    Diabetes Neg Hx    Heart disease Neg Hx    Stomach cancer Neg Hx    Ulcerative colitis Neg Hx        Physical Exam: Very frail appearing Lungs: clear Card: rr with soft systolic murmur Ext; No edema Neuro: alert.      Diagnostic Studies & Laboratory data: I have personally reviewed the following studies and agree with the findings   TTE (01/2023) IMPRESSIONS     1. Left ventricular ejection fraction, by estimation, is 60 to 65%. The  left ventricle has normal function. The left ventricle has no regional  wall motion abnormalities. There is moderate left ventricular hypertrophy.  Left ventricular diastolic  parameters are indeterminate.   2. Right ventricular systolic function is normal. The right ventricular  size is normal. There is mildly elevated pulmonary artery systolic  pressure. The estimated right ventricular systolic pressure is 45.0 mmHg.   3. Left atrial size was severely dilated.  4. S/p Mitraclip with 2 XTW clips in the A2-P2 position. Severe eccentric  mitral valve regurgitation, originating lateral to the clips. 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 The mitral valve has  been repaired/replaced. Severe  mitral valve regurgitation. The mean mitral valve gradient is 5.4 mmHg  with average heart rate of 62 bpm. There is a Mitra-Clip present in the  mitral position. Procedure Date: 02/15/2022.   5. Tricuspid valve regurgitation is mild to moderate.   6. The aortic valve is grossly normal. Aortic valve regurgitation is  trivial. No aortic stenosis is present.   7. Iatrogenic atrial septal defect s/p transeptal puncture.. Evidence of  atrial level shunting detected by color flow Doppler. There is a atrial  septal defect with predominantly left to right shunting across the atrial  septum.   8. The inferior vena cava is normal in size with greater than 50%   respiratory variability, suggesting right atrial pressure of 3 mmHg.   FINDINGS   Left Ventricle: Left ventricular ejection fraction, by estimation, is 60  to 65%. The left ventricle has normal function. The left ventricle has no  regional wall motion abnormalities. The left ventricular internal cavity  size was normal in size. There is   moderate left ventricular hypertrophy. Left ventricular diastolic  function could not be evaluated due to mitral valve repair. Left  ventricular diastolic parameters are indeterminate.   Right Ventricle: The right ventricular size is normal. No increase in  right ventricular wall thickness. Right ventricular systolic function is  normal. There is mildly elevated pulmonary artery systolic pressure. The  tricuspid regurgitant velocity is 3.24   m/s, and with an assumed right atrial pressure of 3 mmHg, the estimated  right ventricular systolic pressure is 45.0 mmHg.   Left Atrium: Left atrial size was severely dilated.   Right Atrium: Right atrial size was normal in size.   Pericardium: There is no evidence of pericardial effusion.   Mitral Valve: S/p Mitraclip with 2 XTW clips in the A2-P2 position. Severe  eccentric mitral valve regurgitation, originating lateral to the clips.  Myxomatous native mitral valve with continued leaflet prolapse. The mitral  valve has been  repaired/replaced. Severe mitral valve regurgitation. There is a  Mitra-Clip present in the mitral position. Procedure Date: 02/15/2022. MV  peak gradient, 30.9 mmHg. The mean mitral valve gradient is 5.4 mmHg with  average heart rate of 62 bpm.   Tricuspid Valve: The tricuspid valve is grossly normal. Tricuspid valve  regurgitation is mild to moderate. No evidence of tricuspid stenosis.   Aortic Valve: The aortic valve is grossly normal. Aortic valve  regurgitation is trivial. No aortic stenosis is present.   Pulmonic Valve: The pulmonic valve was not well visualized. Pulmonic  valve  regurgitation is trivial. No evidence of pulmonic stenosis.   Aorta: The aortic root is normal in size and structure.   Venous: The inferior vena cava is normal in size with greater than 50%  respiratory variability, suggesting right atrial pressure of 3 mmHg.   IAS/Shunts: Evidence of atrial level shunting detected by color flow  Doppler. There is a atrial septal defect with predominantly left to right  shunting across the atrial septum.     LEFT VENTRICLE  PLAX 2D  LVIDd:         5.10 cm   Diastology  LVIDs:         2.90 cm   LV e' medial:    5.33 cm/s  LV PW:  1.30 cm   LV E/e' medial:  41.5  LV IVS:        1.50 cm   LV e' lateral:   9.90 cm/s  LVOT diam:     2.00 cm   LV E/e' lateral: 22.3  LV SV:         58  LV SV Index:   39  LVOT Area:     3.14 cm     RIGHT VENTRICLE             IVC  RV S prime:     17.10 cm/s  IVC diam: 1.60 cm  TAPSE (M-mode): 2.6 cm  RVSP:           45.0 mmHg   LEFT ATRIUM            Index        RIGHT ATRIUM           Index  LA diam:      5.80 cm  3.87 cm/m   RA Pressure: 3.00 mmHg  LA Vol (A4C): 144.0 ml 96.01 ml/m  RA Area:     16.20 cm                                      RA Volume:   40.20 ml  26.80 ml/m   AORTIC VALVE  LVOT Vmax:   113.00 cm/s  LVOT Vmean:  61.400 cm/s  LVOT VTI:    0.185 m    AORTA  Ao Root diam: 3.50 cm  Ao Asc diam:  3.00 cm   MITRAL VALVE                TRICUSPID VALVE  MV Area (PHT): 2.28 cm     TR Peak grad:   42.0 mmHg  MV Area VTI:   0.93 cm     TR Vmax:        324.00 cm/s  MV Peak grad:  30.9 mmHg    Estimated RAP:  3.00 mmHg  MV Mean grad:  5.4 mmHg     RVSP:           45.0 mmHg  MV Vmax:       2.78 m/s  MV Vmean:      109.0 cm/s   SHUNTS  MV Decel Time: 333 msec     Systemic VTI:  0.18 m  MV E velocity: 221.00 cm/s  Systemic Diam: 2.00 cm  MV A velocity: 60.90 cm/s  MV E/A ratio:  3.63     Recent Radiology Findings:       Recent Lab Findings: Lab Results  Component Value  Date   WBC 5.1 11/25/2022   HGB 12.3 11/25/2022   HCT 39.3 11/25/2022   PLT 152 11/25/2022   GLUCOSE 87 12/07/2022   CHOL 171 08/08/2021   TRIG 100.0 08/08/2021   HDL 53.30 08/08/2021   LDLDIRECT 135.0 08/16/2009   LDLCALC 97 08/08/2021   ALT 3 09/25/2022   AST 13 09/25/2022   NA 137 12/07/2022   K 4.1 12/07/2022   CL 101 12/07/2022   CREATININE 0.79 12/07/2022   BUN 17 12/07/2022   CO2 28 12/07/2022   TSH 0.84 03/23/2022   INR 1.1 06/15/2022   HGBA1C 5.3 01/14/2018      Assessment / Plan:     Pt with NYHA 2-3 symptoms of severe MR with recurrence after mitral  clip. We discussed with pt's brother present the surgical implications of proceeding with mitral valve replacement. On review of a previous CTA of the chest, her anatomy is prohibitive for mini thorocotomy with very high right diaphragm and displaced LV into Left pleural space with inability to be able to see mitral annulus that approach. I would also be concerned at her age that the longer procedural time on the CPB system would not be advantageous for her, and if her symptoms were not relieved with the second mitral clip procedure, I would offer her MVR but with elevated risks secondary to her age, parkinsons disease and frailty but only if her feeling was that her quality of life would be such to justify the risks of surgery. She feels that she would not want "open heart surgery" at this time.   I have spent 60 min in review of the records, viewing studies and in face to face with patient and in coordination of future care    Eugenio Hoes 03/24/2023 10:28 AM

## 2023-03-25 ENCOUNTER — Encounter: Payer: Self-pay | Admitting: Thoracic Surgery (Cardiothoracic Vascular Surgery)

## 2023-03-25 ENCOUNTER — Institutional Professional Consult (permissible substitution): Payer: Medicare PPO | Admitting: Thoracic Surgery (Cardiothoracic Vascular Surgery)

## 2023-03-25 VITALS — BP 115/63 | HR 63 | Resp 20 | Ht 62.0 in | Wt 117.0 lb

## 2023-03-25 DIAGNOSIS — I059 Rheumatic mitral valve disease, unspecified: Secondary | ICD-10-CM

## 2023-03-25 NOTE — Patient Instructions (Signed)
Proceed with mitral clip Assess her symptoms after and if failed clip attempt, to reevaluate her desires to accept high risk surgery

## 2023-03-26 ENCOUNTER — Telehealth: Payer: Self-pay

## 2023-03-26 ENCOUNTER — Other Ambulatory Visit: Payer: Self-pay

## 2023-03-26 DIAGNOSIS — R2689 Other abnormalities of gait and mobility: Secondary | ICD-10-CM | POA: Diagnosis not present

## 2023-03-26 DIAGNOSIS — M6281 Muscle weakness (generalized): Secondary | ICD-10-CM | POA: Diagnosis not present

## 2023-03-26 DIAGNOSIS — J449 Chronic obstructive pulmonary disease, unspecified: Secondary | ICD-10-CM | POA: Diagnosis not present

## 2023-03-26 DIAGNOSIS — F419 Anxiety disorder, unspecified: Secondary | ICD-10-CM | POA: Diagnosis not present

## 2023-03-26 DIAGNOSIS — I5033 Acute on chronic diastolic (congestive) heart failure: Secondary | ICD-10-CM | POA: Diagnosis not present

## 2023-03-26 DIAGNOSIS — S72002D Fracture of unspecified part of neck of left femur, subsequent encounter for closed fracture with routine healing: Secondary | ICD-10-CM | POA: Diagnosis not present

## 2023-03-26 DIAGNOSIS — G20A1 Parkinson's disease without dyskinesia, without mention of fluctuations: Secondary | ICD-10-CM | POA: Diagnosis not present

## 2023-03-26 DIAGNOSIS — R2681 Unsteadiness on feet: Secondary | ICD-10-CM | POA: Diagnosis not present

## 2023-03-26 DIAGNOSIS — I340 Nonrheumatic mitral (valve) insufficiency: Secondary | ICD-10-CM

## 2023-03-26 DIAGNOSIS — Z96642 Presence of left artificial hip joint: Secondary | ICD-10-CM | POA: Diagnosis not present

## 2023-03-26 NOTE — Progress Notes (Signed)
Care Management & Coordination Services Pharmacy Team  Reason for Encounter: Appointment Reminder  Contacted patient to confirm telephone appointment with Al Corpus , PharmD on 03/29/23 at 2:15. Spoke with patient on 03/26/2023   Do you have any problems getting your medications? No  What is your top health concern you would like to discuss at your upcoming visit?   Patient states no concerns  Have you seen any other providers since your last visit with PCP? Yes   cardiology  Chart review:  Recent consult visits:  03/25/23-Paul Weldner,MD(cardio)-f/u mitral valve disease,discussed mitral clip surgery  Hospital visits:  None in previous 6 months   Star Rating Drugs:  Medication:  Last Fill: Day Supply No star medications identified  Care Gaps: Annual wellness visit in last year? Yes   Al Corpus, PharmD notified  Burt Knack, [facility] Clinical Pharmacy Assistant [phone]

## 2023-03-28 ENCOUNTER — Telehealth: Payer: Self-pay | Admitting: *Deleted

## 2023-03-28 ENCOUNTER — Other Ambulatory Visit: Payer: Self-pay | Admitting: Family Medicine

## 2023-03-28 NOTE — Progress Notes (Signed)
  Care Coordination Note  03/28/2023 Name: Crystal Bass MRN: [id number] DOB: 1941-02-21  Crystal Bass is a 82 y.o. year old adult who is a primary care patient of Eustaquio Boyden, MD and is actively engaged with the care management team. I reached out to Crystal Bass by phone today to assist with re-scheduling an initial visit with the RN Case Manager  Follow up plan: Unsuccessful telephone outreach attempt made. A HIPAA compliant phone message was left for the patient providing contact information and requesting a return call.   Burman Nieves, CCMA Care Coordination Care Guide Direct Dial: [phone]

## 2023-03-29 ENCOUNTER — Telehealth: Payer: Self-pay | Admitting: Pharmacist

## 2023-03-29 ENCOUNTER — Encounter: Payer: Medicare PPO | Admitting: Pharmacist

## 2023-03-29 NOTE — Progress Notes (Unsigned)
Care Management & Coordination Services Pharmacy Note  12/19/2022 Name:  [facility]ME HOST MRN:  [id number] DOB:  October 09, 1941  Summary: F/U visit -HF: pt is compliant with furosemide as prescribed; she weighs herself daily, dry weight ~113 lbs, she has been 115 lbs the last 2 days and has taken an extra dose of furosemide yesterday; reviewed diuretic instructions per cardiology -COPD: pt uses Trelegy every day, though she does report she sometimes feels like she is not getting the full dose due to inability to take a large enough breath; discussed possibility of switching to an inhaler that is not reliant on patient's breath (Respimat, HFA), pt declined at she feels symptoms are under control currently -Osteoporosis: pt is up to date with Prolia injections; discussed fall risk at length in s/o sedating medications (clonazepam, gabapentin), currently she uses Rollator to get around  Recommendations/Changes made from today's visit: -Advised pt to call cardiology if wt is not back to dry weight over next 2-3 days -Advised pt to monitor BP at home and contact provider if BP is < 100/60 -No changes  Follow up plan: -Health Concierge will call patient 1 month for HF update -Pharmacist follow up televisit scheduled for 3 months -Neurology appt 01/29/23; HF clinic appt 02/11/23; Cardiology appt 04/19/23    Subjective: BRIANNIE LAGOW is an 82 y.o. year old adult who is a primary patient of Eustaquio Boyden, MD.  The care coordination team was consulted for assistance with disease management and care coordination needs.    Engaged with patient face to face for initial visit. Patient lives independently at [facility]. She does drive herself to appts and to pick up prescriptions. Her meals are a mix of her own cooking and the facility's meals. She sets up medications each day in pill cups. She uses a rollator to get around her home most of the time, occasionally will walk without it.  Recent office  visits: 12/07/22 Dr Sharen Hones OV: hospital f/u - update Lasix to 40 mg once daily.[name] BMP stable. Referred to PT. Reschedule CHF clinic appt.  09/25/22-Javier Gutierrez,MD(PCP)-f/u left hip fracture.labs,( anemia is improving with hgb up to 11.4. Vitamin D level returned low - rec start 1000 IU daily OTC. Vitamin B12 borderline low normal - consider OTC b12 supplement.Kidneys, liver returned normal,Your urine test returned reassuringly normal today).  09/21/22-Javier Gutierrez,MD(PCP)-telemedicine-urinary frequency-colect urine sample and bring in. Rx keflex 500mg  TID 5d course.   Recent consult visits: 03/25/23-Paul Weldner,MD(cardio)-f/u mitral valve disease,discussed mitral clip surgery   03/14/23 Dr Excell Seltzer (Cardiology): mitral regurg - could proceed with mitraclip repair. Refer to Dr Brennan Bailey for surgical eval to consider MVR (higher success rate, harder recovery)  02/11/23 NP Julien Girt (Cardiology): mitral regurg - pt cancelled TEER procedure. Now with NYHA III/IV symptoms - see Dr Excell Seltzer.   01/29/23 Dr Tat (Neurology): Diplopia, Parkinsons - continue Sinemet CR. Hx orthostasis - pt declined to stop amlodipine d/t aortic aneurysm. Discussed permissive HTN concept.  12/11/22 Dr Kirke Corin (Cardiology): f/u - updated lasix 40 mg rx - may take extra 1/2 tab in PM for wt gain 3+ lbs in 48h or 5+ lbs in a week 09/11/22-Christopher Berge,NP(cardio)-f/u mitraclip-no medication changes  09/05/22- [facility]- no data 09/03/22-Tina Hackney,NP(cardio)-f/u shortness of breath,document daily weights,no medication changes, f/u 6 weeks 08/09/22-Muhammad Arida,MD(cardio)-f/u mitral valve regurgitation,no medication changes f/u 4 months 07/17/22-[facility]- no data   Hospital visits: 11/21/22 - 11/26/22 Admission [facility]): acute CHF exacerbation. IV lasix. Discharged on higher dose of Lasix  - 20 mg BID.  09/22/22 ED  visit [facility]): L hip pain. S/p L hip surgery 08/21/22, discharged to SNF. Given lidocaine patch in ED.    08/20/22 - 08/24/22 Admission [facility]): closed L hip fracture d/t fall.   Objective:  Lab Results  Component Value Date   CREATININE 0.79 12/07/2022   BUN 17 12/07/2022   GFR 70.99 12/07/2022   EGFR 84 04/30/2022   GFRNONAA >60 11/25/2022   GFRAA 66 12/23/2018   NA 137 12/07/2022   K 4.1 12/07/2022   CALCIUM 9.4 12/07/2022   CO2 28 12/07/2022   GLUCOSE 87 12/07/2022    Lab Results  Component Value Date/Time   HGBA1C 5.3 01/14/2018 11:21 AM   HGBA1C 5.4 05/19/2014 03:58 PM   GFR 70.99 12/07/2022 12:23 PM   GFR 82.19 09/25/2022 01:40 PM   MICROALBUR 7.1 (H) 01/22/2020 01:09 PM    Last diabetic Eye exam: No results found for: "HMDIABEYEEXA"  Last diabetic Foot exam: No results found for: "HMDIABFOOTEX"   Lab Results  Component Value Date   CHOL 171 08/08/2021   HDL 53.30 08/08/2021   LDLCALC 97 08/08/2021   LDLDIRECT 135.0 08/16/2009   TRIG 100.0 08/08/2021   CHOLHDL 3 08/08/2021       Latest Ref Rng & Units 12/07/2022   12:23 PM 09/25/2022    1:40 PM 06/15/2022   11:13 AM  Hepatic Function  Total Protein 6.0 - 8.3 g/dL  7.2  7.3   Albumin 3.5 - 5.2 g/dL 4.1  3.9  4.0   AST 0 - 37 U/L  13  16   ALT 0 - 35 U/L  3  <5   Alk Phosphatase 39 - 117 U/L  82  62   Total Bilirubin 0.2 - 1.2 mg/dL  1.1  1.7   Bilirubin, Direct 0.0 - 0.3 mg/dL  0.2      Lab Results  Component Value Date/Time   TSH 0.84 03/23/2022 12:21 PM   TSH 1.893 10/26/2018 12:23 PM   TSH 1.52 10/23/2017 12:26 PM   FREET4 0.93 02/09/2015 08:53 AM       Latest Ref Rng & Units 11/25/2022    4:58 AM 11/24/2022    4:32 AM 11/23/2022    4:31 AM  CBC  WBC 4.0 - 10.5 K/uL 5.1  5.1  5.6   Hemoglobin 12.0 - 15.0 g/dL 21.3  08.6  57.8   Hematocrit 36.0 - 46.0 % 39.3  39.6  37.8   Platelets 150 - 400 K/uL 152  146  148     Lab Results  Component Value Date/Time   VD25OH 23.69 (L) 09/25/2022 01:40 PM   VD25OH 29.58 (L) 08/08/2021 01:20 PM   VITAMINB12 350 09/25/2022 01:40 PM   VITAMINB12 384  08/08/2021 01:20 PM    Clinical ASCVD: Yes  - aortic atherosclerosis The ASCVD Risk score (Arnett DK, et al., 2019) failed to calculate for the following reasons:   The 2019 ASCVD risk score is only valid for ages 74 to 65       12/07/2022   11:43 AM 09/03/2022    2:04 PM 08/13/2022    1:11 PM  Depression screen PHQ 2/9  Decreased Interest 0 0 1  Down, Depressed, Hopeless 0 0 1  PHQ - 2 Score 0 0 2  Altered sleeping   0  Tired, decreased energy   0  Change in appetite   0  Feeling bad or failure about yourself    0  Trouble concentrating   0  Moving slowly or  fidgety/restless   0  Suicidal thoughts   0  PHQ-9 Score   2  Difficult doing work/chores  Not difficult at all Not difficult at all     Social History   Tobacco Use  Smoking Status Never  Smokeless Tobacco Never   BP Readings from Last 3 Encounters:  03/25/23 115/63  03/14/23 130/80  02/11/23 120/70   Pulse Readings from Last 3 Encounters:  03/25/23 63  03/14/23 66  02/11/23 72   Wt Readings from Last 3 Encounters:  03/25/23 117 lb (53.1 kg)  03/14/23 115 lb (52.2 kg)  02/11/23 113 lb (51.3 kg)   BMI Readings from Last 3 Encounters:  03/25/23 21.40 kg/m  03/14/23 21.03 kg/m  02/11/23 20.67 kg/m    Allergies  Allergen Reactions   Sulfa Antibiotics Anaphylaxis   Sulfonamide Derivatives Anaphylaxis   Topamax [Topiramate] Other (See Comments)    Metabolic acidosis    Clarithromycin Other (See Comments)    pericarditis Other reaction(s): Not available   Hydrocodone Other (See Comments)    "hyper and climbing the walls"   Motrin [Ibuprofen] Other (See Comments)    headaches   Misc. Sulfonamide Containing Compounds     Other reaction(s): Not available   Symbicort [Budesonide-Formoterol Fumarate] Other (See Comments)    02/07/15 tremor   Tetracycline Hcl Other (See Comments)   Percocet [Oxycodone-Acetaminophen] Itching and Rash   Tape Itching and Rash    Use paper tape only   Tetracyclines &  Related Other (See Comments)    "immediate yeast infection"    Medications Reviewed Today     Reviewed by Eugenio Hoes, MD (Physician) on 03/25/23 at 1502  Med List Status: <None>   Medication Order Taking? Sig Documenting Provider Last Dose Status Informant  acetaminophen (TYLENOL) 500 MG tablet [id number] Yes Take 1,000 mg by mouth every 12 (twelve) hours as needed. [provider] Taking Active Self  albuterol (VENTOLIN HFA) 108 (90 Base) MCG/ACT inhaler [id number] Yes Inhale 2 puffs into the lungs every 6 (six) hours as needed for wheezing or shortness of breath. Eustaquio Boyden, MD Taking Active Self  amLODipine (NORVASC) 2.5 MG tablet [id number] Yes Take 1 tablet (2.5 mg total) by mouth daily. Iran Ouch, MD Taking Active Self  aspirin 81 MG tablet [id number] Yes Take 1 tablet (81 mg total) by mouth daily. Eustaquio Boyden, MD Taking Active Self  calcium carbonate (TUMS - DOSED IN MG ELEMENTAL CALCIUM) 500 MG chewable tablet [id number] Yes Chew 1 tablet by mouth as needed for indigestion or heartburn. [provider] Taking Active   Carbidopa-Levodopa ER (SINEMET CR) 25-100 MG tablet controlled release [id number] Yes TAKE TWO TABLET BY MOUTH AT NINE IN THE MORNING, ONE TAB AT ONE IN THE EVENING AND ONE TAB AT FIVE IN THE EVENING Tat, Octaviano Batty, DO Taking Active   Cholecalciferol (VITAMIN D3) 25 MCG (1000 UT) CAPS [id number] Yes Take 1 capsule (1,000 Units total) by mouth daily. Eustaquio Boyden, MD Taking Active Self           Med Note Robyne Askew Dec 20, 2022 10:53 AM)    clonazePAM (KLONOPIN) 0.5 MG tablet [id number] Yes TAKE ONE HALF (1/2) A TABLET BY MOUTH AT BEDTIME Tat, Octaviano Batty, DO Taking Active   EPINEPHrine 0.3 mg/0.3 mL IJ SOAJ injection [id number] Yes Inject 0.3 mg into the muscle as needed for anaphylaxis. [provider] Taking Active Self           Med Note (MENDOZA  MENDEZ, Carleene Overlie Mar 19, 2022 12:36 PM)    FEROSUL 325  (65 Fe) MG tablet [id number] Yes TAKE ONE TABLET BY MOUTH DAILY WITH Antony Madura, MD Taking Active   fexofenadine [facility]) 180 MG tablet [id number] Yes Take 180 mg by mouth daily. [provider] Taking Active Self  Fluticasone-Umeclidin-Vilant (TRELEGY ELLIPTA) 100-62.5-25 MCG/ACT AEPB [id number] Yes Inhale 1 puff into the lungs daily. Noemi Chapel, NP Taking Active Self  furosemide (LASIX) 40 MG tablet [id number] Yes Take 1 tablet (40 mg total) by mouth daily. May take an extra 20 mg in the afternoon for a weight gain of 3 lbs in 48 hours or 5 lbs in a week Iran Ouch, MD Taking Active   gabapentin ([name]) 300 MG capsule [id number] Yes TAKE TWO CAPSULES BY MOUTH THREE TIMES DAILY Eustaquio Boyden, MD Taking Active   pantoprazole (PROTONIX) 20 MG tablet [id number] Yes Take 20 mg by mouth daily. [provider] Taking Active Self  potassium chloride SA (KLOR-CON M) 20 MEQ tablet [id number] Yes TAKE ONE TABLET BY MOUTH ONCE A DAY Iran Ouch, MD Taking Active             SDOH:  (Social Determinants of Health) assessments and interventions performed: No SDOH Interventions    Flowsheet Row Telephone from 11/28/2022 in [facility] Community Care Coordination ED to Hosp-Admission (Discharged) from 11/21/2022 in [facility] REGIONAL CARDIAC MED PCU Clinical Support from 08/13/2022 in [facility] Cardiac Rehab from 05/09/2022 in [facility] Cardiac and Pulmonary Rehab Cardiac Rehab from 03/21/2022 in [facility] Cardiac and Pulmonary Rehab Clinical Support from 08/05/2021 in [facility] [place]  SDOH Interventions        Food Insecurity Interventions Intervention Not Indicated -- Intervention Not Indicated -- -- Intervention Not Indicated  Housing Interventions Intervention Not Indicated -- Intervention Not Indicated -- -- Intervention Not Indicated  Transportation Interventions Intervention Not  Indicated Intervention Not Indicated Intervention Not Indicated -- -- Intervention Not Indicated  Utilities Interventions -- -- Intervention Not Indicated -- -- --  Alcohol Usage Interventions -- -- Intervention Not Indicated (Score <7) -- -- --  Depression Interventions/Treatment  -- -- -- PHQ2-9 Score <4 Follow-up Not Indicated Counseling --  Financial Strain Interventions -- -- Intervention Not Indicated -- -- --  Physical Activity Interventions -- -- Intervention Not Indicated -- -- Other (Comments)  [due to recent fall and recovering from that]  Stress Interventions -- -- Intervention Not Indicated -- -- Intervention Not Indicated  Social Connections Interventions -- -- Intervention Not Indicated -- -- Intervention Not Indicated       Medication Assistance: None required.  Patient affirms current coverage meets needs.  Medication Access: Within the past 30 days, how often has patient missed a dose of medication? 0 Is a pillbox or other method used to improve adherence? Yes  Factors that may affect medication adherence? no barriers identified Are meds synced by current pharmacy? No  Are meds delivered by current pharmacy? No  Does patient experience delays in picking up medications due to transportation concerns? No   [facility] Services Reviewed: Is patient disadvantaged to use [facility]?: No  Current Rx insurance plan: [employer] Name and location of Current pharmacy:  [name] - [place], [place] - [address] [address] Phone: [phone] Fax: [phone]  [facility] [place], [place] - [address] [place] [address]  Phone: [phone] Fax: [phone]  [facility] Pharm[facility] services reviewed with patient today?: No  Patient requests to transfer care to [facility] Pharmacy?: No  Reason patient declined to change pharmacies: Loyalty to other pharmacy/Patient  preference  Compliance/Adherence/Medication fill history: Care Gaps: Mammogram (due 05/2022)  Star-Rating Drugs: None   Assessment/Plan   Hyperlipidemia: (LDL goal < 70) -Not ideally controlled - LDL 97 (07/2021), given aortic atherosclerosis on CT ideal LDL goal is < 70; pt denies hx of bleeding on aspirin -Current treatment: Aspirin 81 mg daily - Appropriate, Effective, Safe, Accessible -Medications previously tried: none  -Current dietary patterns: 2-3 meals per day -Current exercise habits: walks around her home -Educated on Cholesterol goals;  -Recommend to update lipid panel at next PCP appt; consider statin if LDL is > 70  Heart Failure (Goal: BP < 140/90) -Not ideally controlled - pt reports swelling in her ankles today and reports her weight is up 2 lbs for the past 2 days; she did take an extra Lasix yesterday, as instructed in recent cardiology appt; she reports the extra dose did not lead to increased urination -Last ejection fraction: 60-65% (Date: 07/2022) -HF type: HFpEF (EF > 50%) - grade II diastolic dysfunction -NYHA Class: III (marked limitation of activity) -Current home BP/HR readings: none available - BP monitor is out of batteries -Current home daily weights: 115# today (dry weight is ~113#) -Current treatment: Amlodipine 2.5 mg daily - Appropriate, Effective, Safe, Accessible Furosemide 40 mg daily + extra 1/2 tab PRN Klor-Con 20 mEq daily -Medications previously tried: bisoprolol, losartan, metoprolol -Current dietary habits: pt avoids salt, she reads nutrition labels to limit sodium -Current exercise habits: limited, pt uses rollator to get around her home -Educated on Benefits of medications for managing symptoms and prolonging life; Importance of weighing daily; if you gain more than 3 pounds in one day or 5 pounds in one week, take an extra Lasix 20 mg; Importance of blood pressure control -Recommended to continue current medication; advised pt to call  cardiology if wt is not back to dry weight by Friday 1/26  COPD/asthma (Goal: control symptoms and prevent exacerbations) -Controlled - pt uses Trelegy every day, though she does report she sometimes feels like she is not getting the full dose due to inability to take a large enough breath -Gold Grade: Gold 2 (FEV1 50-79%) -Current COPD Classification:  A (low sx, 0-1 moderate exacerbations, no hospitalizations) -Pulmonary function testing: 12/2018 - FEV1 77% pred; FEV1/FVC 0.77; FEV1 9% change -Exacerbations requiring treatment in last 6 months: 0 -Follows with pulmonology (NP Micheline Maze) -Current treatment  Albuterol HFA prn - Appropriate, Effective, Safe, Accessible Trelegy 100-62.5-25 mcg/act 1 puff daily - Appropriate, Query Effective -Medications previously tried: Clinical cytogeneticist (ineffective) -Patient reports consistent use of maintenance inhaler -Frequency of rescue inhaler use: rare -Counseled on Proper inhaler technique; Benefits of consistent maintenance inhaler use -Discussed possibility of switching to an inhaler that is not reliant on patient's breath (Respimat, HFA); she has tried Clinical cytogeneticist in the past and thinks it was not as effective as Trelegy; since breathing is under relative good control, will continue with Trelegy but may consider Breztri in future if propellant-inhaler is needed -Recommended to continue current medication  Osteoporosis (Goal prevent fractures) -Stable - pt is compliant with Prolia injections -s/p hip fracture 07/2022 -Last DEXA Scan: 04/06/22   T-Score femoral neck: -2.5  T-Score total hip: -2.2  T-Score lumbar spine: -1.8  T-Score forearm radius: -4.0 -Patient is a candidate for pharmacologic treatment due to history  of hip fracture  and T-Score < -2.5 in femoral neck -Current treatment  Prolia - started 05/2020; last dose 10/30/22 - Appropriate, Effective, Safe, Accessible Vitamin D 1000 IU daily - Appropriate, Effective, Safe, Accessible -Medications  previously tried: alendronate (~2012)  -Recommend weight-bearing and muscle strengthening exercises for building and maintaining bone density. -Discussed fall risk at length - advised to use rollator at all times at home; when standing ensure balance/stability before moving -Recommended to continue current medication  Parkinsons Disease (Goal: manage symptoms) -Controlled, stable; pt has Rx for ER version, not IR. Updated med list. -Pt reports clonazepam has helped tremendously with nightmares that she developed as a side effect of Levodopa -Follows with neurology (Dr Tat) -Current treatment  Carbidope-levodopa ER 25-100 mg TID - 9a, 1p, 5p - Appropriate, Effective, Safe, Accessible Clonazepam 0.5 mg HS - Appropriate, Effective, Safe, Accessible -Medications previously tried: n/a  -Discussed fall risk at length - medications like clonazepam, gabapentin can cause sedation, drowsiness and falls -Recommended to continue current medication  Neuropathy (Goal: manage symptoms) -Not ideally controlled - pt reports she feels like neuropathy is getting worse; she describes nerve pain on her torso began after biopsies and mastectomy, now she endorses similar burning sensation in her feet -Current treatment  Gabapentin 300 mg - 2 cap TID - Appropriate, Query Effective -Medications previously tried: none  -Discussed risks with increasing gabapentin - may not improve neuropathy and can increase risk for sedation/falls -Discussed possibility of adding duloxetine, pt would like to continue with current medication -Recommended to continue current medication; trial Capsaicin cream on feet  GERD (Goal: minimize symptoms of reflux ) -Controlled - per pt report -Current treatment  Pantoprazole 20 mg daily - Appropriate, Effective, Safe, Accessible Tums -Appropriate, Effective, Safe, Accessible -Medications previously tried: none reported  -Recommended to continue current medication  Health  Maintenance -Vaccine gaps: none -Mammogram due -Pt was taking Senna since hospitalization for hip fracture 07/2022. She recently ran out and has not been taking it. She reports bowel movements are unchanged. Advised to monitor BM closely and if she experiences more constipation, she can restart senna.   Al Corpus, PharmD, BCACP Clinical Pharmacist [facility] at [facility] [phone]

## 2023-03-29 NOTE — Telephone Encounter (Signed)
Care Management & Coordination Services Outreach Note  03/29/2023 Name: Crystal Bass MRN: [id number] DOB: January 30, 1941  Referred by: Eustaquio Boyden, MD  Patient had a phone appointment scheduled with clinical pharmacist today.  An unsuccessful telephone outreach was attempted today. The patient was referred to the pharmacist for assistance with medications, care management and care coordination.   Patient will NOT be penalized in any way for missing a Care Management & Coordination Services appointment. The no-show fee does not apply.  If possible, a message was left to return call to: [phone] or to [facility].  Al Corpus, PharmD, BCACP Clinical Pharmacist [place] [facility] at [facility] [phone]

## 2023-03-30 ENCOUNTER — Other Ambulatory Visit: Payer: Self-pay | Admitting: Physician Assistant

## 2023-04-01 ENCOUNTER — Other Ambulatory Visit: Payer: Self-pay | Admitting: Family Medicine

## 2023-04-01 ENCOUNTER — Encounter (HOSPITAL_COMMUNITY)
Admission: RE | Admit: 2023-04-01 | Discharge: 2023-04-01 | Disposition: A | Discharge status: Home / Self Care | Payer: Medicare PPO | Source: Ambulatory Visit | Attending: Cardiovascular Disease | Admitting: Cardiovascular Disease

## 2023-04-01 ENCOUNTER — Ambulatory Visit (HOSPITAL_COMMUNITY)
Admission: RE | Admit: 2023-04-01 | Discharge: 2023-04-01 | Disposition: A | Discharge status: Home / Self Care | Payer: Medicare PPO | Source: Ambulatory Visit | Attending: Cardiovascular Disease | Admitting: Cardiovascular Disease

## 2023-04-01 ENCOUNTER — Other Ambulatory Visit: Payer: Self-pay

## 2023-04-01 DIAGNOSIS — M79605 Pain in left leg: Secondary | ICD-10-CM | POA: Diagnosis present

## 2023-04-01 DIAGNOSIS — J449 Chronic obstructive pulmonary disease, unspecified: Secondary | ICD-10-CM | POA: Diagnosis not present

## 2023-04-01 DIAGNOSIS — Z1152 Encounter for screening for COVID-19: Secondary | ICD-10-CM | POA: Diagnosis not present

## 2023-04-01 DIAGNOSIS — J90 Pleural effusion, not elsewhere classified: Secondary | ICD-10-CM | POA: Diagnosis not present

## 2023-04-01 DIAGNOSIS — Z01818 Encounter for other preprocedural examination: Secondary | ICD-10-CM | POA: Insufficient documentation

## 2023-04-01 DIAGNOSIS — I5033 Acute on chronic diastolic (congestive) heart failure: Secondary | ICD-10-CM | POA: Diagnosis not present

## 2023-04-01 DIAGNOSIS — Z91048 Other nonmedicinal substance allergy status: Secondary | ICD-10-CM | POA: Diagnosis not present

## 2023-04-01 DIAGNOSIS — Z79899 Other long term (current) drug therapy: Secondary | ICD-10-CM | POA: Diagnosis not present

## 2023-04-01 DIAGNOSIS — Z882 Allergy status to sulfonamides status: Secondary | ICD-10-CM | POA: Diagnosis not present

## 2023-04-01 DIAGNOSIS — I083 Combined rheumatic disorders of mitral, aortic and tricuspid valves: Secondary | ICD-10-CM | POA: Diagnosis not present

## 2023-04-01 DIAGNOSIS — M810 Age-related osteoporosis without current pathological fracture: Secondary | ICD-10-CM | POA: Diagnosis present

## 2023-04-01 DIAGNOSIS — Z888 Allergy status to other drugs, medicaments and biological substances status: Secondary | ICD-10-CM | POA: Diagnosis not present

## 2023-04-01 DIAGNOSIS — Z9012 Acquired absence of left breast and nipple: Secondary | ICD-10-CM | POA: Diagnosis not present

## 2023-04-01 DIAGNOSIS — J4489 Other specified chronic obstructive pulmonary disease: Secondary | ICD-10-CM | POA: Diagnosis not present

## 2023-04-01 DIAGNOSIS — Z881 Allergy status to other antibiotic agents status: Secondary | ICD-10-CM | POA: Diagnosis not present

## 2023-04-01 DIAGNOSIS — Z885 Allergy status to narcotic agent status: Secondary | ICD-10-CM | POA: Diagnosis not present

## 2023-04-01 DIAGNOSIS — I340 Nonrheumatic mitral (valve) insufficiency: Secondary | ICD-10-CM | POA: Insufficient documentation

## 2023-04-01 DIAGNOSIS — I509 Heart failure, unspecified: Secondary | ICD-10-CM | POA: Diagnosis not present

## 2023-04-01 DIAGNOSIS — Z886 Allergy status to analgesic agent status: Secondary | ICD-10-CM | POA: Diagnosis not present

## 2023-04-01 DIAGNOSIS — I951 Orthostatic hypotension: Secondary | ICD-10-CM | POA: Diagnosis present

## 2023-04-01 DIAGNOSIS — Z66 Do not resuscitate: Secondary | ICD-10-CM | POA: Diagnosis not present

## 2023-04-01 DIAGNOSIS — Z5982 Transportation insecurity: Secondary | ICD-10-CM | POA: Diagnosis not present

## 2023-04-01 DIAGNOSIS — Z954 Presence of other heart-valve replacement: Secondary | ICD-10-CM | POA: Diagnosis not present

## 2023-04-01 DIAGNOSIS — Z853 Personal history of malignant neoplasm of breast: Secondary | ICD-10-CM | POA: Diagnosis not present

## 2023-04-01 DIAGNOSIS — Z006 Encounter for examination for normal comparison and control in clinical research program: Secondary | ICD-10-CM | POA: Diagnosis not present

## 2023-04-01 DIAGNOSIS — Z8781 Personal history of (healed) traumatic fracture: Secondary | ICD-10-CM | POA: Diagnosis not present

## 2023-04-01 DIAGNOSIS — I110 Hypertensive heart disease with heart failure: Secondary | ICD-10-CM | POA: Diagnosis not present

## 2023-04-01 DIAGNOSIS — G20A1 Parkinson's disease without dyskinesia, without mention of fluctuations: Secondary | ICD-10-CM | POA: Diagnosis not present

## 2023-04-01 DIAGNOSIS — I081 Rheumatic disorders of both mitral and tricuspid valves: Secondary | ICD-10-CM | POA: Diagnosis not present

## 2023-04-01 DIAGNOSIS — Z86711 Personal history of pulmonary embolism: Secondary | ICD-10-CM | POA: Diagnosis not present

## 2023-04-01 DIAGNOSIS — Z96642 Presence of left artificial hip joint: Secondary | ICD-10-CM | POA: Diagnosis present

## 2023-04-01 LAB — COMPREHENSIVE METABOLIC PANEL
ALT: <5 U/L (ref 0–44)
AST: 20 U/L (ref 15–41)
Albumin: 3.8 g/dL (ref 3.5–5.0)
Alkaline Phosphatase: 67 U/L (ref 38–126)
Anion gap: 11 (ref 5–15)
BUN: 14 mg/dL (ref 8–23)
CO2: 27 mmol/L (ref 22–32)
Calcium: 9.7 mg/dL (ref 8.9–10.3)
Chloride: 99 mmol/L (ref 98–111)
Creatinine, Ser: 0.93 mg/dL (ref 0.44–1.00)
GFR, Estimated: >60 mL/min (ref >60)
Glucose, Bld: 99 mg/dL (ref 70–99)
Potassium: 4.1 mmol/L (ref 3.5–5.1)
Sodium: 137 mmol/L (ref 135–145)
Total Bilirubin: 2 mg/dL — ABNORMAL HIGH (ref 0.3–1.2)
Total Protein: 6.9 g/dL (ref 6.5–8.1)

## 2023-04-01 LAB — TYPE AND SCREEN
ABO/RH(D): O POS
Antibody Screen: NEGATIVE

## 2023-04-01 LAB — CBC
HCT: 42.2 % (ref 36.0–46.0)
Hemoglobin: 13.4 g/dL (ref 12.0–15.0)
MCH: 32.8 pg (ref 26.0–34.0)
MCHC: 31.8 g/dL (ref 30.0–36.0)
MCV: 103.4 fL — ABNORMAL HIGH (ref 80.0–100.0)
Platelets: 137 10*3/uL — ABNORMAL LOW (ref 150–400)
RBC: 4.08 MIL/uL (ref 3.87–5.11)
RDW: 13.7 % (ref 11.5–15.5)
WBC: 4.6 10*3/uL (ref 4.0–10.5)
nRBC: 0 % (ref 0.0–0.2)

## 2023-04-01 LAB — SURGICAL PCR SCREEN
MRSA, PCR: NEGATIVE
Staphylococcus aureus: POSITIVE — AB

## 2023-04-01 LAB — URINALYSIS, ROUTINE W REFLEX MICROSCOPIC
Bacteria, UA: NONE SEEN
Bilirubin Urine: NEGATIVE
Glucose, UA: NEGATIVE mg/dL
Ketones, ur: NEGATIVE mg/dL
Nitrite: NEGATIVE
Protein, ur: NEGATIVE mg/dL
Specific Gravity, Urine: 1.008 (ref 1.005–1.030)
pH: 5 (ref 5.0–8.0)

## 2023-04-01 LAB — PROTIME-INR
INR: 1.1 (ref 0.8–1.2)
Prothrombin Time: 13.7 seconds (ref 11.4–15.2)

## 2023-04-01 LAB — BRAIN NATRIURETIC PEPTIDE: B Natriuretic Peptide: 681.9 pg/mL — ABNORMAL HIGH (ref 0.0–100.0)

## 2023-04-01 MED ORDER — PANTOPRAZOLE SODIUM 20 MG PO TBEC
20.0000 mg | DELAYED_RELEASE_TABLET | Freq: Every day | ORAL | Status: DC
Start: 2023-04-01 — End: 2024-03-06

## 2023-04-01 NOTE — Progress Notes (Addendum)
Patient signed all consents at PAT lab appointment. CHG soap and instructions were given to patient. CHG surgical prep reviewed with patient and all questions answered.  Pt denies any respiratory illness/infection in the last two months. She only endorses symptoms related to seasonal allergies.  EKG not completed at lab appointment because pt has already completed this within the ordered timeframe. Order discontinued.

## 2023-04-02 LAB — SARS CORONAVIRUS 2 (TAT 6-24 HRS): SARS Coronavirus 2: NEGATIVE

## 2023-04-03 ENCOUNTER — Inpatient Hospital Stay (HOSPITAL_COMMUNITY)
Admission: RE | Admit: 2023-04-03 | Discharge: 2023-04-04 | DRG: 266 | Disposition: A | Discharge status: Nursing Home (Includes ICF) | Payer: Medicare PPO | Attending: Cardiovascular Disease | Admitting: Cardiovascular Disease

## 2023-04-03 ENCOUNTER — Encounter (HOSPITAL_COMMUNITY): Payer: Self-pay | Admitting: Cardiovascular Disease

## 2023-04-03 ENCOUNTER — Encounter (HOSPITAL_COMMUNITY)
Admission: RE | Disposition: A | Discharge status: Home / Self Care | Payer: Self-pay | Source: Ambulatory Visit | Attending: Cardiovascular Disease

## 2023-04-03 ENCOUNTER — Inpatient Hospital Stay (HOSPITAL_COMMUNITY): Payer: Medicare PPO | Admitting: Physician Assistant

## 2023-04-03 ENCOUNTER — Inpatient Hospital Stay (HOSPITAL_COMMUNITY)
Admission: RE | Admit: 2023-04-03 | Discharge: 2023-04-03 | Disposition: A | Discharge status: Home / Self Care | Payer: Medicare PPO | Source: Ambulatory Visit | Attending: Cardiovascular Disease | Admitting: Cardiovascular Disease

## 2023-04-03 ENCOUNTER — Inpatient Hospital Stay (HOSPITAL_COMMUNITY): Payer: Medicare PPO | Admitting: Certified Registered Nurse Anesthetist

## 2023-04-03 ENCOUNTER — Other Ambulatory Visit: Payer: Self-pay | Admitting: Cardiology

## 2023-04-03 DIAGNOSIS — Z79899 Other long term (current) drug therapy: Secondary | ICD-10-CM

## 2023-04-03 DIAGNOSIS — Z66 Do not resuscitate: Secondary | ICD-10-CM | POA: Diagnosis present

## 2023-04-03 DIAGNOSIS — J4489 Other specified chronic obstructive pulmonary disease: Secondary | ICD-10-CM | POA: Diagnosis present

## 2023-04-03 DIAGNOSIS — M79605 Pain in left leg: Secondary | ICD-10-CM | POA: Diagnosis present

## 2023-04-03 DIAGNOSIS — Z888 Allergy status to other drugs, medicaments and biological substances status: Secondary | ICD-10-CM | POA: Diagnosis not present

## 2023-04-03 DIAGNOSIS — Z91048 Other nonmedicinal substance allergy status: Secondary | ICD-10-CM | POA: Diagnosis not present

## 2023-04-03 DIAGNOSIS — J449 Chronic obstructive pulmonary disease, unspecified: Secondary | ICD-10-CM

## 2023-04-03 DIAGNOSIS — Z5982 Transportation insecurity: Secondary | ICD-10-CM | POA: Diagnosis not present

## 2023-04-03 DIAGNOSIS — Z96642 Presence of left artificial hip joint: Secondary | ICD-10-CM | POA: Diagnosis present

## 2023-04-03 DIAGNOSIS — Z886 Allergy status to analgesic agent status: Secondary | ICD-10-CM

## 2023-04-03 DIAGNOSIS — Z882 Allergy status to sulfonamides status: Secondary | ICD-10-CM | POA: Diagnosis not present

## 2023-04-03 DIAGNOSIS — I10 Essential (primary) hypertension: Secondary | ICD-10-CM | POA: Diagnosis present

## 2023-04-03 DIAGNOSIS — Z8781 Personal history of (healed) traumatic fracture: Secondary | ICD-10-CM

## 2023-04-03 DIAGNOSIS — A310 Pulmonary mycobacterial infection: Secondary | ICD-10-CM | POA: Diagnosis present

## 2023-04-03 DIAGNOSIS — Z006 Encounter for examination for normal comparison and control in clinical research program: Secondary | ICD-10-CM

## 2023-04-03 DIAGNOSIS — Z885 Allergy status to narcotic agent status: Secondary | ICD-10-CM

## 2023-04-03 DIAGNOSIS — I951 Orthostatic hypotension: Secondary | ICD-10-CM | POA: Diagnosis present

## 2023-04-03 DIAGNOSIS — Z853 Personal history of malignant neoplasm of breast: Secondary | ICD-10-CM | POA: Diagnosis not present

## 2023-04-03 DIAGNOSIS — K2100 Gastro-esophageal reflux disease with esophagitis, without bleeding: Secondary | ICD-10-CM | POA: Diagnosis present

## 2023-04-03 DIAGNOSIS — G20A1 Parkinson's disease without dyskinesia, without mention of fluctuations: Secondary | ICD-10-CM | POA: Diagnosis present

## 2023-04-03 DIAGNOSIS — Z9012 Acquired absence of left breast and nipple: Secondary | ICD-10-CM | POA: Diagnosis not present

## 2023-04-03 DIAGNOSIS — I081 Rheumatic disorders of both mitral and tricuspid valves: Secondary | ICD-10-CM

## 2023-04-03 DIAGNOSIS — M810 Age-related osteoporosis without current pathological fracture: Secondary | ICD-10-CM | POA: Diagnosis present

## 2023-04-03 DIAGNOSIS — Z86711 Personal history of pulmonary embolism: Secondary | ICD-10-CM

## 2023-04-03 DIAGNOSIS — I110 Hypertensive heart disease with heart failure: Secondary | ICD-10-CM | POA: Diagnosis present

## 2023-04-03 DIAGNOSIS — I5033 Acute on chronic diastolic (congestive) heart failure: Secondary | ICD-10-CM | POA: Diagnosis present

## 2023-04-03 DIAGNOSIS — Z7982 Long term (current) use of aspirin: Secondary | ICD-10-CM

## 2023-04-03 DIAGNOSIS — I509 Heart failure, unspecified: Secondary | ICD-10-CM | POA: Diagnosis not present

## 2023-04-03 DIAGNOSIS — Z1152 Encounter for screening for COVID-19: Secondary | ICD-10-CM | POA: Diagnosis not present

## 2023-04-03 DIAGNOSIS — I5032 Chronic diastolic (congestive) heart failure: Secondary | ICD-10-CM

## 2023-04-03 DIAGNOSIS — I340 Nonrheumatic mitral (valve) insufficiency: Secondary | ICD-10-CM

## 2023-04-03 DIAGNOSIS — Z881 Allergy status to other antibiotic agents status: Secondary | ICD-10-CM

## 2023-04-03 DIAGNOSIS — Z9889 Other specified postprocedural states: Principal | ICD-10-CM

## 2023-04-03 DIAGNOSIS — R0609 Other forms of dyspnea: Secondary | ICD-10-CM | POA: Diagnosis present

## 2023-04-03 DIAGNOSIS — Z954 Presence of other heart-valve replacement: Secondary | ICD-10-CM | POA: Diagnosis not present

## 2023-04-03 HISTORY — PX: TEE WITHOUT CARDIOVERSION: SHX5443

## 2023-04-03 HISTORY — PX: MITRAL VALVE REPAIR: CATH118311

## 2023-04-03 LAB — CBC
HCT: 37.5 % (ref 36.0–46.0)
Hemoglobin: 12.2 g/dL (ref 12.0–15.0)
MCH: 32.9 pg (ref 26.0–34.0)
MCHC: 32.5 g/dL (ref 30.0–36.0)
MCV: 101.1 fL — ABNORMAL HIGH (ref 80.0–100.0)
Platelets: 115 10*3/uL — ABNORMAL LOW (ref 150–400)
RBC: 3.71 MIL/uL — ABNORMAL LOW (ref 3.87–5.11)
RDW: 13.6 % (ref 11.5–15.5)
WBC: 4.8 10*3/uL (ref 4.0–10.5)
nRBC: 0 % (ref 0.0–0.2)

## 2023-04-03 LAB — ECHO TEE: Radius: 2.2 cm

## 2023-04-03 LAB — CREATININE, SERUM
Creatinine, Ser: 0.74 mg/dL (ref 0.44–1.00)
GFR, Estimated: >60 mL/min (ref >60)

## 2023-04-03 SURGERY — MITRAL VALVE REPAIR
Anesthesia: General

## 2023-04-03 MED ORDER — ASPIRIN 81 MG PO TBEC
81.0000 mg | DELAYED_RELEASE_TABLET | Freq: Every day | ORAL | Status: DC
  Administered 2023-04-04: 81 mg via ORAL
  Filled 2023-04-03: qty 1

## 2023-04-03 MED ORDER — LIDOCAINE 2% (20 MG/ML) 5 ML SYRINGE
INTRAMUSCULAR | Status: DC | PRN
  Administered 2023-04-03: 40 mg via INTRAVENOUS

## 2023-04-03 MED ORDER — LACTATED RINGERS IV SOLN: INTRAVENOUS | Status: DC

## 2023-04-03 MED ORDER — CHLORHEXIDINE GLUCONATE 0.12 % MT SOLN
15.0000 mL | Freq: Once | OROMUCOSAL | Status: AC
  Administered 2023-04-03: 15 mL via OROMUCOSAL
  Filled 2023-04-03: qty 15

## 2023-04-03 MED ORDER — FUROSEMIDE 10 MG/ML IJ SOLN
20.0000 mg | Freq: Once | INTRAMUSCULAR | Status: AC
  Administered 2023-04-03: 20 mg via INTRAVENOUS
  Filled 2023-04-03: qty 2

## 2023-04-03 MED ORDER — FUROSEMIDE 40 MG PO TABS
40.0000 mg | ORAL_TABLET | Freq: Every day | ORAL | Status: DC
  Administered 2023-04-04: 40 mg via ORAL
  Filled 2023-04-03: qty 1

## 2023-04-03 MED ORDER — CHLORHEXIDINE GLUCONATE 4 % EX SOLN: 60.0000 mL | Freq: Once | CUTANEOUS | Status: DC

## 2023-04-03 MED ORDER — GABAPENTIN 300 MG PO CAPS
300.0000 mg | ORAL_CAPSULE | Freq: Three times a day (TID) | ORAL | Status: DC
  Administered 2023-04-03 – 2023-04-04 (×3): 300 mg via ORAL
  Filled 2023-04-03 (×3): qty 1

## 2023-04-03 MED ORDER — ACETAMINOPHEN 325 MG PO TABS
325.0000 mg | ORAL_TABLET | Freq: Once | ORAL | Status: AC
  Administered 2023-04-03: 325 mg via ORAL
  Filled 2023-04-03: qty 1

## 2023-04-03 MED ORDER — HEPARIN SODIUM (PORCINE) 1000 UNIT/ML IJ SOLN
INTRAMUSCULAR | Status: DC | PRN
  Administered 2023-04-03: 7000 [IU] via INTRAVENOUS
  Administered 2023-04-03: 2000 [IU] via INTRAVENOUS
  Administered 2023-04-03: 4000 [IU] via INTRAVENOUS

## 2023-04-03 MED ORDER — PHENYLEPHRINE 80 MCG/ML (10ML) SYRINGE FOR IV PUSH (FOR BLOOD PRESSURE SUPPORT)
PREFILLED_SYRINGE | INTRAVENOUS | Status: DC | PRN
  Administered 2023-04-03: 80 ug via INTRAVENOUS

## 2023-04-03 MED ORDER — CARBIDOPA-LEVODOPA ER 25-100 MG PO TBCR
1.0000 | EXTENDED_RELEASE_TABLET | Freq: Every day | ORAL | Status: DC
  Filled 2023-04-03: qty 1

## 2023-04-03 MED ORDER — HEPARIN (PORCINE) IN NACL 2000-0.9 UNIT/L-% IV SOLN
INTRAVENOUS | Status: DC | PRN
  Administered 2023-04-03 (×4): 1000 mL

## 2023-04-03 MED ORDER — CARBIDOPA-LEVODOPA ER 25-100 MG PO TBCR
1.0000 | EXTENDED_RELEASE_TABLET | ORAL | Status: DC
  Administered 2023-04-03: 1 via ORAL
  Filled 2023-04-03 (×2): qty 1

## 2023-04-03 MED ORDER — LABETALOL HCL 5 MG/ML IV SOLN: 10.0000 mg | INTRAVENOUS | Status: DC | PRN

## 2023-04-03 MED ORDER — ACETAMINOPHEN 500 MG PO TABS
1000.0000 mg | ORAL_TABLET | Freq: Four times a day (QID) | ORAL | Status: DC | PRN
  Administered 2023-04-04: 1000 mg via ORAL
  Filled 2023-04-03: qty 2

## 2023-04-03 MED ORDER — SODIUM CHLORIDE 0.9% FLUSH: 3.0000 mL | INTRAVENOUS | Status: DC | PRN

## 2023-04-03 MED ORDER — CLONAZEPAM 0.5 MG PO TABS
0.5000 mg | ORAL_TABLET | Freq: Every day | ORAL | Status: DC
  Administered 2023-04-03: 0.5 mg via ORAL
  Filled 2023-04-03: qty 1

## 2023-04-03 MED ORDER — FENTANYL CITRATE (PF) 100 MCG/2ML IJ SOLN
INTRAMUSCULAR | Status: AC
  Filled 2023-04-03: qty 2

## 2023-04-03 MED ORDER — PANTOPRAZOLE SODIUM 20 MG PO TBEC
20.0000 mg | DELAYED_RELEASE_TABLET | Freq: Every day | ORAL | Status: DC
  Administered 2023-04-03 – 2023-04-04 (×2): 20 mg via ORAL
  Filled 2023-04-03 (×2): qty 1

## 2023-04-03 MED ORDER — CEFAZOLIN SODIUM-DEXTROSE 2-4 GM/100ML-% IV SOLN
2.0000 g | INTRAVENOUS | Status: AC
  Administered 2023-04-03: 2 g via INTRAVENOUS
  Filled 2023-04-03 (×2): qty 100

## 2023-04-03 MED ORDER — PHENYLEPHRINE HCL-NACL 20-0.9 MG/250ML-% IV SOLN
INTRAVENOUS | Status: DC | PRN
  Administered 2023-04-03: 25 ug/min via INTRAVENOUS

## 2023-04-03 MED ORDER — DEXAMETHASONE SODIUM PHOSPHATE 10 MG/ML IJ SOLN
INTRAMUSCULAR | Status: DC | PRN
  Administered 2023-04-03: 4 mg via INTRAVENOUS

## 2023-04-03 MED ORDER — FENTANYL CITRATE (PF) 100 MCG/2ML IJ SOLN
INTRAMUSCULAR | Status: DC | PRN
  Administered 2023-04-03 (×3): 25 ug via INTRAVENOUS

## 2023-04-03 MED ORDER — AMLODIPINE BESYLATE 2.5 MG PO TABS
2.5000 mg | ORAL_TABLET | Freq: Every day | ORAL | Status: DC
  Filled 2023-04-03: qty 1

## 2023-04-03 MED ORDER — HEPARIN SODIUM (PORCINE) 5000 UNIT/ML IJ SOLN
5000.0000 [IU] | Freq: Three times a day (TID) | INTRAMUSCULAR | Status: DC
  Administered 2023-04-03 – 2023-04-04 (×3): 5000 [IU] via SUBCUTANEOUS
  Filled 2023-04-03 (×3): qty 1

## 2023-04-03 MED ORDER — PROPOFOL 10 MG/ML IV BOLUS
INTRAVENOUS | Status: DC | PRN
  Administered 2023-04-03: 70 mg via INTRAVENOUS

## 2023-04-03 MED ORDER — POTASSIUM CHLORIDE CRYS ER 20 MEQ PO TBCR
20.0000 meq | EXTENDED_RELEASE_TABLET | Freq: Every day | ORAL | Status: DC
  Administered 2023-04-04: 20 meq via ORAL
  Filled 2023-04-03 (×3): qty 1

## 2023-04-03 MED ORDER — PROTAMINE SULFATE 10 MG/ML IV SOLN
INTRAVENOUS | Status: DC | PRN
  Administered 2023-04-03: 30 mg via INTRAVENOUS

## 2023-04-03 MED ORDER — ACETAMINOPHEN 325 MG PO TABS
650.0000 mg | ORAL_TABLET | ORAL | Status: DC | PRN
  Administered 2023-04-03: 650 mg via ORAL
  Filled 2023-04-03: qty 2

## 2023-04-03 MED ORDER — SODIUM CHLORIDE 0.9% FLUSH
3.0000 mL | Freq: Two times a day (BID) | INTRAVENOUS | Status: DC
  Administered 2023-04-03 – 2023-04-04 (×3): 3 mL via INTRAVENOUS

## 2023-04-03 MED ORDER — CARBIDOPA-LEVODOPA ER 25-100 MG PO TBCR
2.0000 | EXTENDED_RELEASE_TABLET | ORAL | Status: DC
  Administered 2023-04-04: 2 via ORAL
  Filled 2023-04-03: qty 2

## 2023-04-03 MED ORDER — SUGAMMADEX SODIUM 200 MG/2ML IV SOLN
INTRAVENOUS | Status: DC | PRN
  Administered 2023-04-03: 200 mg via INTRAVENOUS

## 2023-04-03 MED ORDER — CHLORHEXIDINE GLUCONATE 4 % EX SOLN: 30.0000 mL | CUTANEOUS | Status: DC

## 2023-04-03 MED ORDER — ONDANSETRON HCL 4 MG/2ML IJ SOLN
INTRAMUSCULAR | Status: DC | PRN
  Administered 2023-04-03: 4 mg via INTRAVENOUS

## 2023-04-03 MED ORDER — ROCURONIUM BROMIDE 10 MG/ML (PF) SYRINGE
PREFILLED_SYRINGE | INTRAVENOUS | Status: DC | PRN
  Administered 2023-04-03: 40 mg via INTRAVENOUS
  Administered 2023-04-03: 50 mg via INTRAVENOUS
  Administered 2023-04-03: 10 mg via INTRAVENOUS

## 2023-04-03 MED ORDER — SODIUM CHLORIDE 0.9 % IV SOLN: 250.0000 mL | INTRAVENOUS | Status: DC | PRN

## 2023-04-03 MED ORDER — HEPARIN (PORCINE) IN NACL 1000-0.9 UT/500ML-% IV SOLN
INTRAVENOUS | Status: DC | PRN
  Administered 2023-04-03: 500 mL

## 2023-04-03 MED ORDER — ONDANSETRON HCL 4 MG/2ML IJ SOLN: 4.0000 mg | Freq: Four times a day (QID) | INTRAMUSCULAR | Status: DC | PRN

## 2023-04-03 MED ORDER — FLUTICASONE FUROATE-VILANTEROL 100-25 MCG/ACT IN AEPB
1.0000 | INHALATION_SPRAY | Freq: Every day | RESPIRATORY_TRACT | Status: DC
  Administered 2023-04-04: 1 via RESPIRATORY_TRACT
  Filled 2023-04-03: qty 28

## 2023-04-03 MED ORDER — HYDRALAZINE HCL 20 MG/ML IJ SOLN: 5.0000 mg | INTRAMUSCULAR | Status: DC | PRN

## 2023-04-03 MED ORDER — SODIUM CHLORIDE 0.9 % IV SOLN: INTRAVENOUS | Status: DC

## 2023-04-03 SURGICAL SUPPLY — 16 items
CATH MITRA STEERABLE GUIDE (CATHETERS) IMPLANT
CLIP MITRA G4 DELIVERY SYS XTW (Clip) IMPLANT
CLOSURE PERCLOSE PROSTYLE (VASCULAR PRODUCTS) IMPLANT
ELECT DEFIB PAD ADLT CADENCE (PAD) IMPLANT
KIT HEART LEFT (KITS) ×4 IMPLANT
KIT MICROPUNCTURE NIT STIFF (SHEATH) IMPLANT
KIT VERSACROSS LRG ACCESS (CATHETERS) IMPLANT
PACK CARDIAC CATHETERIZATION (CUSTOM PROCEDURE TRAY) ×2 IMPLANT
SHEATH DILAT COONS TAPER 22F (SHEATH) IMPLANT
SHEATH PINNACLE 8F 10CM (SHEATH) IMPLANT
SHEATH PROBE COVER 6X72 (BAG) ×2 IMPLANT
STOPCOCK MORSE 400PSI 3WAY (MISCELLANEOUS) ×12 IMPLANT
SYSTEM MITRACLIP G4 (SYSTAGENIX WOUND MANAGEMENT) IMPLANT
TRANSDUCER W/STOPCOCK (MISCELLANEOUS) ×2 IMPLANT
TUBING ART PRESS 72  MALE/FEM (TUBING) ×1
TUBING ART PRESS 72 MALE/FEM (TUBING) ×2 IMPLANT

## 2023-04-03 NOTE — Transfer of Care (Signed)
Immediate Anesthesia Transfer of Care Note  Patient: Crystal Bass  Procedure(s) Performed: MITRAL VALVE REPAIR TRANSESOPHAGEAL ECHOCARDIOGRAM  Patient Location: Cath Lab  Anesthesia Type:General  Level of Consciousness: awake, drowsy, and patient cooperative  Airway & Oxygen Therapy: Patient Spontanous Breathing and Patient connected to nasal cannula oxygen  Post-op Assessment: Report given to RN and Post -op Vital signs reviewed and stable  Post vital signs: Reviewed and stable  Last Vitals:  Vitals Value Taken Time  BP 136/86 04/03/23 1400  Temp 36.7 C 04/03/23 1359  Pulse 61 04/03/23 1405  Resp 20 04/03/23 1404  SpO2 87 % 04/03/23 1405  Vitals shown include unvalidated device data.  Last Pain:  Vitals:   04/03/23 1359  TempSrc: Temporal  PainSc: 0-No pain         Complications: There were no known notable events for this encounter.

## 2023-04-03 NOTE — Progress Notes (Addendum)
Rec'd page from nurse that patient is requesting Tylenol dose be 1000 mg per dose rather than 650 mg ordered earlier for hip pain (known issue). Got 650mg  just a short while ago so will give 325mg  x 1 now, then change order to 1000mg  q6hr.

## 2023-04-03 NOTE — Discharge Instructions (Signed)
Home Care Following Your MitraClip Procedure      If you have any questions or concerns you can call the structural heart office at [phone] during normal business hours 8am-4pm. If you have an urgent need after hours or on the weekend, please call [phone] to talk to the on call provider for general cardiology. If you have an emergency that requires immediate attention, please call 911.   Groin Site Care Refer to this sheet in the next few weeks. These instructions provide you with information on caring for yourself after your procedure. Your caregiver may also give you more specific instructions. Your treatment has been planned according to current medical practices, but problems sometimes occur. Call your caregiver if you have any problems or questions after your procedure. HOME CARE INSTRUCTIONS You may shower 24 hours after the procedure. Remove the bandage (dressing) and gently wash the site with plain soap and water. Gently pat the site dry.  Do not apply powder or lotion to the site.  Do not sit in a bathtub, swimming pool, or whirlpool for 5 to 7 days.  No bending, squatting, or lifting anything over 10 pounds (4.5 kg) as directed by your caregiver.  Inspect the site at least twice daily.  Do not drive home if you are discharged the same day of the procedure. Have someone else drive you.  You may drive 72 hours after the procedure unless otherwise instructed by your caregiver.  What to expect: Any bruising will usually fade within 1 to 2 weeks.  Blood that collects in the tissue (hematoma) may be painful to the touch. It should usually decrease in size and tenderness within 1 to 2 weeks.  SEEK IMMEDIATE MEDICAL CARE IF: You have unusual pain at the groin site or down the affected leg.  You have redness, warmth, swelling, or pain at the groin site.  You have drainage (other than a small amount of blood on the dressing).  You have chills.  You have a fever or persistent  symptoms for more than 72 hours.  You have a fever and your symptoms suddenly get worse.  Your leg becomes pale, cool, tingly, or numb.  You have bleeding from the site. Hold pressure on the site until it subsides.    After MitraClip Checklist  Check  Test Description   Follow up appointment in 1-2 weeks  Most of our patients will see our structural heart APP or your primary cardiologist within 1-2 weeks. Your incision site will be checked and you will be cleared to resume all normal activities if you are doing well.     1 month echo and follow up  You will have an echo to check on your heart valve clip and be seen back in the office by our structural heart APP   Follow up with your primary cardiologist You will need to be seen by your primary cardiologist in the following 3-6 months after your 1 month appointment in the valve clinic. Often times your blood thinners will be changed. This is decided on a case by case basis.    1 year echo and follow up You will have another echo to check on your heart valve after one year and be seen back in the office by our structural heart APP. This your last structural heart visit.   Bacterial endocarditis prophylaxis  You will have to take antibiotics for the rest of your life before all dental procedures (even dental cleanings) to protect your heart valve   from potential infection. Antibiotics are also required before some surgeries. Please check with your cardiologist before scheduling any surgeries. Also, please make sure to tell us if you have a penicillin allergy as you will require an alternative antibiotic.    ______________  Your Implant Identification Card Following your procedure, you will receive an Implant Identification Card, which your doctor will fill out and which you must carry with you at all times. Show your Implant Identification Card if you report to an emergency room. This card identifies you as a patient who has had a MitraClip device  implanted. If you require a magnetic resonance imaging (MRI) scan, tell your doctor or MRI technician that you have a MitraClip device implanted. Test results indicate that patients with the MitraClip device can safely undergo MRI scans under certain conditions described on the card.  

## 2023-04-03 NOTE — Op Note (Signed)
[facility]   MULTIDISCIPLINARY HEART TEAM  Date of Procedure:  04/03/2023  Preoperative Diagnosis: Severe Symptomatic Mitral Regurgitation (Stage D)  Postoperative Diagnosis: Same   Procedure Performed: Ultrasound-guided right transfemoral venous access Double PreClose right femoral vein Transseptal puncture using Bailess RF needle Mitral valve repair with MitraClip XTW  Surgeon: Micheline Chapman, MD   Echocardiographer: Riley Lam, MD  Anesthesiologist: Shona Simpson, MD  Device Implant: Mitraclip XTW  Procedural Indication: Severe Non-rheumatic Mitral Regurgitation (Stage D)   Brief History: 82 year old woman with history of severe primary mitral regurgitation with NYHA class III symptoms secondary to severe bileaflet prolapse.  The patient underwent transcatheter edge-to-edge repair of the mitral valve last year and developed severe recurrent mitral regurgitation.  Her residual MR is lateral to the previously implanted clips.  After she underwent repeat TEE imaging and surgical consultation with the mitral valve specialist, shared decision making conversation occurs and she provides full informed consent for a repeat mitral TEER procedure  Echo Findings: Preop:  Normal LV systolic function Severe MR secondary to severe anterior leaflet prolapse lateral to the previously implanted clips, Grade 4+ Post-op:  Unchanged LV systolic function 2+ residual MR  Procedural Details: Prep The patient is brought to the cardiac catheterization lab in the fasting state. General anesthesia is induced.  Bilateral groins are prepped  Venous Access Using ultrasound guidance, the right femoral vein is punctured. Ultrasound images are captured and stored in the patient's chart. The vein is dilated and 2 Perclose devices are deplyed at 10' and 2' positions to 'Preclose' the femoral vein. An 8 Fr sheath is inserted.  Transseptal Puncture A Baylis Versacross wire is  advanced into the SVC A Baylis transseptal dilator is advanced into the SVC, and the VersaCross RF wire is retracted into the dilator  The transseptal dilator is retracted into the RA under fluoroscopic and echo guidance to obtain position on the posterior fossa where echo measurements are made to assure appropriate access to the mitral valve. Once proper position is confirmed by echo, RF energy is delivered and the VersaCross wire is advanced into the LA without resistance. The dilator is advanced over the wire where proper position is confirmed by echo and pressure measurement Weight based IV heparin is administered and a therapeutic ACT > 250 is confirmed  Steerable Guide Catheter Insertion The VersaCross wire is positioned at the left upper pulmonary vein The femoral vein is progressively dilated and the 24 Fr Steerable guide catheter is inserted and then directed across the interatrial septum over the wire. Position is confirmed approximately 3 cm into the left atrium The guide is de-aired   MitraClip Insertion The MitraClip XTW is prepped per protocol and inserted via the introducer into the steerable guide catheter The Clip Delivery System (CDS) is advanced under fluoro and echo guidance so that the sleeve markers are evenly spaced on each side of the guide marker  MitraClip Positioning in the Left Atrium (Supravalvular Alignment) M-knob is applied to bring the Clip towards the mitral valve. Echo guidance is used to avoid contact with LA structures. The Clip arms are opened to 180 degrees 2D and 3D TEE imaging is performed in multiple planes and the Clip is positioned and aligned above the valve using standard steering techniques   Entry into the Left Ventricle and Mitral Valve Leaflet Grasp The Clip is advanced across the mitral valve into the LV, maintaining proper orientation and caution is taken to avoid contact with the previously implanted clips  The Clip arms are opened to 120  degrees and the Clip is slowly retracted  Capture of both the anterior and posterior leaflets are visualized by echo and the grippers are dropped  MitraClip Deployment After extensive echo evaluation, reduction in mitral regurgitation is felt to be adequate Following standard protocol, the lock line is removed after testing the lock mechanism. The lock is rechecked and is shown to be intact. The MitraClip device is deployed and the clip delivery system is removed under echo guidance with caution taken to avoid contact with LA structures  Device Removal The clip delivery system is removed under echo guidance The steerable guide catheter is retracted into the right atrium and the interatrial septum is assessed by echo without evidence of right-to-left shunting or large ASD  Hemostasis The guide catheter is removed over a 0.035" wire and the Perclose sutures are tightened with complete hemostasis and no evidence of hematoma  Estimated blood loss: minimal  There are no immediate procedural complications. The patient is transferred to the post-procedure recovery area in stable condition.   SUMMARY: Successful TEER with a MitraClip G4 XTW positioned A2/P2, lateral to the 2 indwelling Clip devices, reducing MR from 4+ at baseline to 2+ post-procedure  Crystal Bass 04/03/2023 3:27 PM

## 2023-04-03 NOTE — Discharge Summary (Incomplete)
[facility]   MULTIDISCIPLINARY HEART VALVE TEAM  Discharge Summary    Patient ID: Crystal Bass MRN: [id number]; DOB: August 10, 1941  Admit date: 04/03/2023 Discharge date: 04/04/2023  Primary Care Provider: Eustaquio Boyden, MD  Primary Cardiologist: Lorine Bears, MD   Discharge Diagnoses    Principal Problem:   S/P mitral valve clip implantation Active Problems:   Essential hypertension   Chronic obstructive airway disease with asthma (HCC)   Chronic dyspnea   Parkinson disease   Orthostatic hypotension   [name] (mycobacterium avium-intracellulare) infection (HCC)   Severe mitral regurgitation   Chronic diastolic (congestive) heart failure (HCC)   Acute on chronic heart failure with preserved ejection fraction (HFpEF) (HCC)   Allergies Allergies  Allergen Reactions   Sulfa Antibiotics Anaphylaxis   Sulfonamide Derivatives Anaphylaxis   Topamax [Topiramate] Other (See Comments)    Metabolic acidosis    Clarithromycin Other (See Comments)    pericarditis Other reaction(s): Not available   Hydrocodone Other (See Comments)    "hyper and climbing the walls"   Motrin [Ibuprofen] Other (See Comments)    headaches   Misc. Sulfonamide Containing Compounds     Other reaction(s): Not available   Symbicort [Budesonide-Formoterol Fumarate] Other (See Comments)    02/07/15 tremor   Tetracycline Hcl Other (See Comments)   Percocet [Oxycodone-Acetaminophen] Itching and Rash   Tape Itching and Rash    Use paper tape only   Tetracyclines & Related Other (See Comments)    "immediate yeast infection"    Diagnostic Studies/Procedures    04/03/23 Conclusion Successful TEER of the mitral valve with a MitraClip G4 XTW device, positioned A2/P2, lateral to the indwelling Clip devices   MR reduced from 4+ to 2+ post-procedure _____________    Echo 04/04/23:  IMPRESSIONS   1. S/p additonal Mitra-Clip procedure. Mild to moderate mitral  regurgitation. There is systolic  blunting of the right sided pulmonary  vein flow. Triple orifice area 2D planimetry of 3.03 cm2. There are two  regurgintant jets; a medial jet and a central  jet between the three Mitra-Clips. There are two iatrogenic ASDs with left  to right flow. The mitral valve has been repaired/replaced. Mild to  moderate mitral valve regurgitation. The mean mitral valve gradient is 4.0  mmHg with average heart rate of 58  bpm. There is a Mitra-Clip present in the mitral position. Procedure Date:  04/03/23.   2. Left ventricular ejection fraction, by estimation, is 60 to 65%. The  left ventricle has normal function. The left ventricle has no regional  wall motion abnormalities. There is mild concentric left ventricular  hypertrophy. Left ventricular diastolic  parameters are indeterminate.   3. Right ventricular systolic function is normal. The right ventricular  size is moderately enlarged. There is moderately elevated pulmonary artery  systolic pressure.   4. Left atrial size was severely dilated.   5. Evidence of atrial level shunting detected by color flow Doppler.   6. Right atrial size was mild to moderately dilated.   7. The aortic valve is tricuspid. Aortic valve regurgitation is trivial.  No aortic stenosis is present.   8. The inferior vena cava is dilated in size with >50% respiratory  variability, suggesting right atrial pressure of 8 mmHg.   History of Present Illness     Crystal Bass is a 82 y.o. adult with a history of pulmonary embolus 2019, Parkinson's disease complicated by orthostatic hypotension, [name] infection with chronic lung disease, remote mastectomy for breast cancer, and  severe mitral regurgitation s/p mTEER 02/15/22 with moderate to severe MR who presented to [facility] on 04/03/23 for planned redo mTEER.   She was hospitalized in 10/2018 with atypical chest pain. Initial CT of the chest showed single pulmonary embolism in the apical segment branch of the left upper lobe. There  was also evidence of bronchiectasis. Repeat CT after 2 days showed resolution of the PE. She was treated with Eliquis for 3 months. She had worsening exertional dyspnea in 2022 and underwent echo in 08/2021 which showed normal LV systolic function, G2DD, small pericardial effusion, and moderate to severe mitral regurgitation due to significant prolapse of the anterior mitral leaflet. She subsequently underwent R/LHC in 01/2022 which showed normal coronary arteries, normal LV systolic function, and moderate to severe MR with giant V waves (34 mmHg). She underwent MitraClip placement x2 in March 2023 though had minimal improvement in symptoms with subsequent echo showing evidence of severe mitral regurgitation which was confirmed by TEE. She was scheduled for placement of a third MitraClip in July 2023 however, patient canceled given risks of mitral stenosis.   She was admitted 07/2022 following a fall with closed left hip fracture requiring left hip hemiarthroplasty. She was seen by our team during admission. Follow-up echo showed an EF of 60 to 65% with grade 3 diastolic dysfunction, mild LVH, and moderate to severe MR and moderate mitral stenosis. She was admitted in 11/2022 with acute CHF requiring IV diuresis.   She was seen back in the office by Dr. Excell Seltzer on 03/14/23 and complained of continued exertional shortness of breath and fatigue and wanted to proceed with a third clip placement. She was subsequently seen by Dr. Leafy Ro for consideration of mini mitral valve replacement. Her anatomy was felt to be prohibitive for mini thoracotomy and she had an elevated risk for surgical MVR given age, parkinsons disease and frailty. Plans were made for second mTEER attempt, which was set up for 04/03/23.  Hospital Course     Consultants: none   Severe MR: s/p successful TEER of the mitral valve with a MitraClip G4 XTW device, positioned A2/P2, lateral to the indwelling Clip devices. MR reduced from 4+ to 2+  post-procedure. Post op echo showed EF 60%, mild to moderate residual MR with a mean gradient of 4 mm hg. Continued on home baby aspirin 81mg  daily. Plan for discharge home to [facility] with close follow up in the outpatient setting.    Acute on chronic diastolic CHF: BNP 681 on PAT labs. Had hypoxia post procedure that improved with IV lasix 20mg . Creat stable today. Resumed on home Lasix 40 mg daily/Kdur daily.    Parkinson's disease complicated by orthostatic hypotension: BP on soft side. Will hold home Norvasc 2.5mg  daily at discharge. If BP rises, we can add back in the outpatient setting. Continue Sinemet.    Left leg pain: has a history of hip fracture on this side. Pain is actually new this admission and limiting her mobility. ? If related to position from surgery. She was evaluated by a mobility specialist and felt to move around pretty well. I had advised her to follow up with her orthopedic specialist, if she continues to have issues.  _____________  Discharge Vitals Blood pressure 106/60, pulse 60, temperature 97.6 F (36.4 C), temperature source Oral, resp. rate 20, height 5\' 2"  (1.575 m), weight 54.4 kg, SpO2 95 %.  Filed Weights   04/03/23 0910  Weight: 54.4 kg    GEN: elderly and frail.  Malnourished appearing. HEENT: Grossly normal.  Neck: Supple, no JVD, carotid bruits, or masses. Cardiac: RRR, 3/6 holosystolic murmur, heard at apex. No rubs, or gallops. No clubbing, cyanosis, edema.   Respiratory:  Respirations regular and unlabored, clear to auscultation bilaterally. GI: Soft, nontender, nondistended, BS + x 4. MS: no deformity or atrophy. Left leg deviated in.  Skin: warm and dry, no rash.  Groin site clear without hematoma or ecchymosis  Neuro:  Strength and sensation are intact. Psych: AAOx3.  Normal affect.  Labs & Radiologic Studies    CBC Recent Labs    04/03/23 1611 04/04/23 0026  WBC 4.8 4.4  HGB 12.2 11.4*  HCT 37.5 35.0*  MCV 101.1* 100.3*   PLT 115* 123*   Basic Metabolic Panel Recent Labs    19/14/78 1611 04/04/23 0026  NA  --  135  K  --  3.9  CL  --  98  CO2  --  28  GLUCOSE  --  113*  BUN  --  14  CREATININE 0.74 0.69  CALCIUM  --  8.7*   Liver Function Tests No results for input(s): "AST", "ALT", "ALKPHOS", "BILITOT", "PROT", "ALBUMIN" in the last 72 hours.  No results for input(s): "LIPASE", "AMYLASE" in the last 72 hours. Cardiac Enzymes No results for input(s): "CKTOTAL", "CKMB", "CKMBINDEX", "TROPONINI" in the last 72 hours. BNP Invalid input(s): "POCBNP" D-Dimer No results for input(s): "DDIMER" in the last 72 hours. Hemoglobin A1C No results for input(s): "HGBA1C" in the last 72 hours. Fasting Lipid Panel No results for input(s): "CHOL", "HDL", "LDLCALC", "TRIG", "CHOLHDL", "LDLDIRECT" in the last 72 hours. Thyroid Function Tests No results for input(s): "TSH", "T4TOTAL", "T3FREE", "THYROIDAB" in the last 72 hours.  Invalid input(s): "FREET3" _____________  ECHOCARDIOGRAM COMPLETE  Result Date: 04/04/2023    ECHOCARDIOGRAM REPORT   Patient Name:   Crystal Bass Date of Exam: 04/04/2023 Medical Rec #:  [id number]         Height:       62.0 in Accession #:    [id number]        Weight:       120.0 lb Date of Birth:  Feb 22, 1941         BSA:          1.539 m Patient Age:    82 years          BP:           126/64 mmHg Patient Gender: F                 HR:           59 bpm. Exam Location:  Inpatient Procedure: 2D Echo, Cardiac Doppler and Color Doppler Indications:    status post MV clip  History:        Patient has prior history of Echocardiogram examinations, most                 recent 04/03/2023. Risk Factors:Hypertension.                  Mitral Valve: Mitra-Clip valve is present in the mitral                 position. Procedure Date: 04/03/23.  Sonographer:    Delcie Roch RDCS Referring Phys: [phone] MICHAEL COOPER  Sonographer Comments: Image acquisition challenging due to respiratory motion. IMPRESSIONS   1. S/p additonal Mitra-Clip procedure. Mild to moderate mitral regurgitation. There is systolic blunting of the right  sided pulmonary vein flow. Triple orifice area 2D planimetry of 3.03 cm2. There are two regurgintant jets; a medial jet and a central jet between the three Mitra-Clips. There are two iatrogenic ASDs with left to right flow. The mitral valve has been repaired/replaced. Mild to moderate mitral valve regurgitation. The mean mitral valve gradient is 4.0 mmHg with average heart rate of 58 bpm. There is a Mitra-Clip present in the mitral position. Procedure Date: 04/03/23.  2. Left ventricular ejection fraction, by estimation, is 60 to 65%. The left ventricle has normal function. The left ventricle has no regional wall motion abnormalities. There is mild concentric left ventricular hypertrophy. Left ventricular diastolic parameters are indeterminate.  3. Right ventricular systolic function is normal. The right ventricular size is moderately enlarged. There is moderately elevated pulmonary artery systolic pressure.  4. Left atrial size was severely dilated.  5. Evidence of atrial level shunting detected by color flow Doppler.  6. Right atrial size was mild to moderately dilated.  7. The aortic valve is tricuspid. Aortic valve regurgitation is trivial. No aortic stenosis is present.  8. The inferior vena cava is dilated in size with >50% respiratory variability, suggesting right atrial pressure of 8 mmHg. FINDINGS  Left Ventricle: Left ventricular ejection fraction, by estimation, is 60 to 65%. The left ventricle has normal function. The left ventricle has no regional wall motion abnormalities. The left ventricular internal cavity size was normal in size. There is  mild concentric left ventricular hypertrophy. Left ventricular diastolic parameters are indeterminate. Right Ventricle: The right ventricular size is moderately enlarged. No increase in right ventricular wall thickness. Right ventricular systolic  function is normal. There is moderately elevated pulmonary artery systolic pressure. The tricuspid regurgitant  velocity is 3.10 m/s, and with an assumed right atrial pressure of 8 mmHg, the estimated right ventricular systolic pressure is 46.4 mmHg. Left Atrium: Left atrial size was severely dilated. Right Atrium: Right atrial size was mild to moderately dilated. Pericardium: There is no evidence of pericardial effusion. Mitral Valve: S/p additonal Mitra-Clip procedure. Mild to moderate mitral regurgitation. There is systolic blunting of the right sided pulmonary vein flow. Triple orifice area 2D planimetry of 3.03 cm2. There are two regurgintant jets; a medial jet and a  central jet between the three Mitra-Clips. There are two iatrogenic ASDs with left to right flow. The mitral valve has been repaired/replaced. Mild to moderate mitral valve regurgitation. There is a Mitra-Clip present in the mitral position. Procedure Date: 04/03/23. MV peak gradient, 11.7 mmHg. The mean mitral valve gradient is 4.0 mmHg with average heart rate of 58 bpm. Tricuspid Valve: The tricuspid valve is normal in structure. Tricuspid valve regurgitation is mild . No evidence of tricuspid stenosis. Aortic Valve: The aortic valve is tricuspid. Aortic valve regurgitation is trivial. No aortic stenosis is present. Pulmonic Valve: The pulmonic valve was normal in structure. Pulmonic valve regurgitation is not visualized. No evidence of pulmonic stenosis. Aorta: The aortic root and ascending aorta are structurally normal, with no evidence of dilitation. Venous: The inferior vena cava is dilated in size with greater than 50% respiratory variability, suggesting right atrial pressure of 8 mmHg. IAS/Shunts: Evidence of atrial level shunting detected by color flow Doppler.  LEFT VENTRICLE PLAX 2D LVIDd:         5.10 cm   Diastology LVIDs:         3.00 cm   LV e' medial:    4.46 cm/s LV PW:  1.00 cm   LV E/e' medial:  34.5 LV IVS:        1.30 cm    LV e' lateral:   5.33 cm/s LVOT diam:     2.00 cm   LV E/e' lateral: 28.9 LV SV:         76 LV SV Index:   50 LVOT Area:     3.14 cm  RIGHT VENTRICLE             IVC RV Basal diam:  3.70 cm     IVC diam: 2.40 cm RV S prime:     14.00 cm/s TAPSE (M-mode): 2.0 cm LEFT ATRIUM              Index         RIGHT ATRIUM           Index LA diam:        5.60 cm  3.64 cm/m    RA Area:     19.90 cm LA Vol (A2C):   159.0 ml 103.34 ml/m  RA Volume:   55.00 ml  35.75 ml/m LA Vol (A4C):   136.0 ml 88.39 ml/m LA Biplane Vol: 149.0 ml 96.84 ml/m  AORTIC VALVE LVOT Vmax:   128.00 cm/s LVOT Vmean:  76.200 cm/s LVOT VTI:    0.243 m  AORTA Ao Root diam: 3.40 cm Ao Asc diam:  3.60 cm MITRAL VALVE                TRICUSPID VALVE MV Area (PHT): 1.67 cm     TR Peak grad:   38.4 mmHg MV Area VTI:   1.14 cm     TR Vmax:        310.00 cm/s MV Peak grad:  11.7 mmHg MV Mean grad:  4.0 mmHg     SHUNTS MV Vmax:       1.71 m/s     Systemic VTI:  0.24 m MV Vmean:      96.6 cm/s    Systemic Diam: 2.00 cm MV Decel Time: 454 msec MV E velocity: 154.00 cm/s MV A velocity: 68.10 cm/s MV E/A ratio:  2.26 Riley Lam MD Electronically signed by Riley Lam MD Signature Date/Time: 04/04/2023/11:24:45 AM    Final    DG Chest 2 View  Result Date: 04/03/2023 CLINICAL DATA:  [id number] Pre-op exam [id number] EXAM: CHEST - 2 VIEW COMPARISON:  11/21/2022 FINDINGS: Cardiac silhouette enlarged. Pulmonary interstitial changes identified consistent with fibrosis. Alveolar process present previously has nearly resolved. There is some residual alveolar density right apex. Left-sided small pleural effusion or pleural thickening. Kyphotic thoracic spine with osteopenia. IMPRESSION: Enlarged cardiac silhouette. Alveolar process present previously has improved. Left-sided pleural effusion has diminished. Electronically Signed   By: Layla Maw M.D.   On: 04/03/2023 21:42   CARDIAC CATHETERIZATION  Result Date: 04/03/2023 Successful TEER of the  mitral valve with a MitraClip G4 XTW device, positioned A2/P2, lateral to the indwelling Clip devices MR reduced from 4+ to 2+ post-procedure   EP STUDY  Result Date: 04/03/2023 Successful TEER of the mitral valve with a MitraClip G4 XTW device, positioned A2/P2, lateral to the indwelling Clip devices MR reduced from 4+ to 2+ post-procedure   ECHO TEE  Result Date: 04/03/2023    TRANSESOPHOGEAL ECHO REPORT   Patient Name:   Crystal Bass Date of Exam: 04/03/2023 Medical Rec #:  [id number]         Height:  62.0 in Accession #:    [id number]        Weight:       117.0 lb Date of Birth:  11-20-1941         BSA:          1.522 m Patient Age:    82 years          BP:           132/97 mmHg Patient Gender: F                 HR:           70 bpm. Exam Location:  Inpatient Procedure: Transesophageal Echo, 3D Echo, Color Doppler and Cardiac Doppler Indications:     Mitral Regurgitation i34.1  History:         Patient has prior history of Echocardiogram examinations, most                  recent 02/11/2023. CHF, COPD; Risk Factors:Hypertension. 2 XTW                  MitraClips placed 02/15/22.  Sonographer:     Irving Burton Senior RDCS Referring Phys:  [phone] MICHAEL COOPER Diagnosing Phys: Riley Lam MD  Sonographer Comments: MitraClip Procedure PROCEDURE: After discussion of the risks and benefits of a TEE, an informed consent was obtained from the patient. The transesophogeal probe was passed without difficulty through the esophogus of the patient. Sedation performed by different physician. The patient was monitored while under deep sedation. The patient developed no complications during the procedure.  IMPRESSIONS  1. 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 Two XTW Mitral Clips had bee placed at A2-P2 but were not, prior to procedure in parallel. 4+, severe mitral regurgitations with related to prolapse with posteriorly directed jet, systolic flow  reversal of the left sided pulmonary veins. Prior XTW clips well seated, most of the mitral regurgitation was lateral to the mTEER. Mean gradient 7.5 mm Hg at a heart rate of 61 bpm; double orifice area 5.26 cm2. Posterior leaflet length 1.14 cm.     A transeptal puncture was performed superior to the prior, residual ASD. An XTW Mitral Clip was placed lateral to A2P2. At time of leaflet capture, normalization of left sided pulmonary vein flow.     At time of deployment mild to moderate mitral regurgitation. Systolic blunting of the left sided pulmonary vein flow. Mean gradient 3 mm Hg. Improvement of mitral regurgitation from 4+ to 2+. Two residual iatrogenic ASDs are noted.. The mitral valve is abnormal. Severe mitral valve regurgitation. No evidence of mitral stenosis.  2. Left ventricular ejection fraction, by estimation, is 60 to 65%. The left ventricle has normal function.  3. Right ventricular systolic function is normal. The right ventricular size is normal.  4. Left atrial size was severely dilated. No left atrial/left atrial appendage thrombus was detected.  5. Right atrial size was moderately dilated.  6. The aortic valve is tricuspid. Aortic valve regurgitation is not visualized. No aortic stenosis is present.  7. Evidence of atrial level shunting detected by color flow Doppler. FINDINGS  Left Ventricle: Left ventricular ejection fraction, by estimation, is 60 to 65%. The left ventricle has normal function. The left ventricular internal cavity size was normal in size. Right Ventricle: The right ventricular size is normal. Right vetricular wall thickness was not well visualized. Right ventricular systolic function is normal. Left Atrium: Left atrial  size was severely dilated. No left atrial/left atrial appendage thrombus was detected. Right Atrium: Right atrial size was moderately dilated. Pericardium: Trivial pericardial effusion is present. Mitral Valve: Prior to the procedure, a myxomatous mitral valve  was present with predominiantly anterior prolapse. Two XTW Mitral Clips had bee placed at A2-P2 but were not, prior to procedure in parallel. 4+, severe mitral regurgitations with related to  prolapse with posteriorly directed jet, systolic flow reversal of the left sided pulmonary veins. Prior XTW clips well seated, most of the mitral regurgitation was lateral to the mTEER. Mean gradient 7.5 mm Hg at a heart rate of 61 bpm; double orifice area 5.26 cm2. Posterior leaflet length 1.14 cm. A transeptal puncture was performed superior to the prior, residual ASD. An XTW Mitral Clip was placed lateral to A2P2. At time of leaflet capture, normalization of left sided pulmonary vein flow. At time of deployment mild to moderate mitral regurgitation. Systolic blunting of the left sided pulmonary vein flow. Mean gradient 3 mm Hg. Improvement of mitral regurgitation from 4+ to 2+. Two residual iatrogenic ASDs are noted. The mitral valve is abnormal. Severe mitral valve regurgitation. No evidence of mitral valve stenosis. Tricuspid Valve: The tricuspid valve is normal in structure. Tricuspid valve regurgitation is mild . No evidence of tricuspid stenosis. Aortic Valve: The aortic valve is tricuspid. Aortic valve regurgitation is not visualized. No aortic stenosis is present. Pulmonic Valve: The pulmonic valve was normal in structure. Pulmonic valve regurgitation is not visualized. No evidence of pulmonic stenosis. Aorta: The aortic root, ascending aorta, aortic arch and descending aorta are all structurally normal, with no evidence of dilitation or obstruction. There is minimal (Grade I) plaque involving the descending aorta. IAS/Shunts: Evidence of atrial level shunting detected by color flow Doppler. Additional Comments: Spectral Doppler performed. MR PISA:        30.41 cm MR PISA Radius: 2.20 cm Riley Lam MD Electronically signed by Riley Lam MD Signature Date/Time: 04/03/2023/2:36:55 PM    Final     Disposition   Pt is being discharged home today in good condition.  Follow-up Plans & Appointments     Follow-up Information     Filbert Schilder, NP. Go on 04/10/2023.   Specialty: Cardiology Why: @ 11:10am. please arrive at least 10 minutes early. Contact information: [address] [place] [address] [phone]                Discharge Instructions     Amb Referral to Cardiac Rehabilitation   Complete by: As directed    Diagnosis: Valve Repair   Valve: Mitral   After initial evaluation and assessments completed: Virtual Based Care may be provided alone or in conjunction with Phase 2 Cardiac Rehab based on patient barriers.: Yes   Intensive Cardiac Rehabilitation (ICR) [facility] location only OR Traditional Cardiac Rehabilitation (TCR) *If criteria for ICR are not met will enroll in TCR [facility] only): Yes       Discharge Medications   Allergies as of 04/04/2023       Reactions   Sulfa Antibiotics Anaphylaxis   Sulfonamide Derivatives Anaphylaxis   Topamax [topiramate] Other (See Comments)   Metabolic acidosis    Clarithromycin Other (See Comments)   pericarditis Other reaction(s): Not available   Hydrocodone Other (See Comments)   "hyper and climbing the walls"   Motrin [ibuprofen] Other (See Comments)   headaches   Misc. Sulfonamide Containing Compounds    Other reaction(s): Not available   Symbicort [budesonide-formoterol  Fumarate] Other (See Comments)   02/07/15 tremor   Tetracycline Hcl Other (See Comments)   Percocet [oxycodone-acetaminophen] Itching, Rash   Tape Itching, Rash   Use paper tape only   Tetracyclines & Related Other (See Comments)   "immediate yeast infection"        Medication List     STOP taking these medications    amLODipine 2.5 MG tablet Commonly known as: NORVASC       TAKE these medications    acetaminophen 500 MG tablet Commonly known as: TYLENOL Take 1,000 mg by mouth 3 (three) times daily.    albuterol 108 (90 Base) MCG/ACT inhaler Commonly known as: VENTOLIN HFA Inhale 2 puffs into the lungs every 6 (six) hours as needed for wheezing or shortness of breath.   aspirin 81 MG tablet Take 1 tablet (81 mg total) by mouth daily.   calcium carbonate 500 MG chewable tablet Commonly known as: TUMS - dosed in mg elemental calcium Chew 2 tablets by mouth daily as needed for indigestion or heartburn.   Carbidopa-Levodopa ER 25-100 MG tablet controlled release Commonly known as: SINEMET CR TAKE TWO TABLET BY MOUTH AT NINE IN THE MORNING, ONE TAB AT ONE IN THE EVENING AND ONE TAB AT FIVE IN THE EVENING   clonazePAM 0.5 MG tablet Commonly known as: KLONOPIN TAKE ONE HALF (1/2) A TABLET BY MOUTH AT BEDTIME What changed: See the new instructions.   diphenhydrAMINE 25 MG tablet Commonly known as: BENADRYL Take 50 mg by mouth in the morning.   EPINEPHrine 0.3 mg/0.3 mL Soaj injection Commonly known as: EPI-PEN Inject 0.3 mg into the muscle as needed for anaphylaxis.   FeroSul 325 (65 FE) MG tablet Generic drug: ferrous sulfate TAKE ONE TABLET BY MOUTH DAILY WITH BREAKFAST   furosemide 40 MG tablet Commonly known as: LASIX TAKE ONE TABLET BY MOUTH ONCE DAILY *NEW DOSE*   gabapentin 300 MG capsule Commonly known as: [name] TAKE TWO CAPSULES BY MOUTH THREE TIMES DAILY   pantoprazole 20 MG tablet Commonly known as: Protonix Take 1 tablet (20 mg total) by mouth daily. What changed: Another medication with the same name was removed. Continue taking this medication, and follow the directions you see here.   potassium chloride SA 20 MEQ tablet Commonly known as: KLOR-CON M TAKE ONE TABLET BY MOUTH ONCE A DAY   Trelegy Ellipta 100-62.5-25 MCG/ACT Aepb Generic drug: Fluticasone-Umeclidin-Vilant Inhale 1 puff into the lungs daily.   Vitamin D3 25 MCG (1000 UT) Caps Take 1 capsule (1,000 Units total) by mouth daily.            Outstanding Labs/Studies    none  Duration of Discharge Encounter   Greater than 30 minutes including physician time.  Signed, Cline Crock, PA-C 04/04/2023, 3:11 PM [phone]  See progress note this same date. Uncomplicated MitraClip procedure. Stable for DC.  Cline Crock 04/04/2023 3:11 PM

## 2023-04-03 NOTE — Anesthesia Procedure Notes (Addendum)
Procedure Name: Intubation Date/Time: 04/03/2023 11:37 AM  Performed by: Lelon Perla, CRNAPre-anesthesia Checklist: Patient identified, Emergency Drugs available, Suction available and Patient being monitored Patient Re-evaluated:Patient Re-evaluated prior to induction Oxygen Delivery Method: Circle system utilized Preoxygenation: Pre-oxygenation with 100% oxygen Induction Type: IV induction Ventilation: Mask ventilation without difficulty Laryngoscope Size: Glidescope and 3 Grade View: Grade I Tube type: Oral Tube size: 7.0 mm Number of attempts: 1 Airway Equipment and Method: Oral airway, Rigid stylet and Video-laryngoscopy Placement Confirmation: ETT inserted through vocal cords under direct vision, positive ETCO2 and breath sounds checked- equal and bilateral Secured at: 21 cm Tube secured with: Tape Dental Injury: Teeth and Oropharynx as per pre-operative assessment  Comments: Pt. Previously a grade 4 view with traditional DL. Limited neck movement and high arched palate.

## 2023-04-03 NOTE — Progress Notes (Signed)
  [facility]   MULTIDISCIPLINARY HEART VALVE TEAM  Patient doing well s/p mTEER. She is hemodynamically stable. 02 sats noted to be low 80s but hand very cold. This improve to high 80s with warming hand. Does not report any shortness of breath. Only complains of left leg pain (previous hip injury). Right groin site stable. BNP noted to be ~700 and admitted for acute CHF in Jan. She was treated with 1L of fluid. Will give one dose of IV lasix 20mg  now.   Cline Crock PA-C  [facility]  Pager [phone]

## 2023-04-03 NOTE — Anesthesia Procedure Notes (Signed)
Arterial Line Insertion Start/End5/06/2023 11:05 AM, 04/03/2023 11:15 AM Performed by: Shelton Silvas, MD, anesthesiologist  Patient location: Pre-op. Preanesthetic checklist: patient identified, IV checked, site marked, risks and benefits discussed, surgical consent, monitors and equipment checked, pre-op evaluation, timeout performed and anesthesia consent Lidocaine 1% used for infiltration Left, radial was placed Catheter size: 20 G Hand hygiene performed  and maximum sterile barriers used   Attempts: 1 Procedure performed without using ultrasound guided technique. Following insertion, dressing applied and Biopatch. Post procedure assessment: normal and unchanged  Post procedure complications: unsuccessful attempts and second provider assisted. Patient tolerated the procedure well with no immediate complications. Additional procedure comments: Three attempts on right by CRNA, unable to advance wire.  One attempt on right by MDA, unable to advance wire, compression dressing applied. [name]

## 2023-04-03 NOTE — Progress Notes (Signed)
Orthopedic Tech Progress Note Patient Details:  Crystal Bass March 25, 1941 [id number]  Ortho Devices Type of Ortho Device: Abduction pillow Ortho Device/Splint Location: Right in between legs Ortho Device/Splint Interventions: Ordered, Application, Adjustment   Post Interventions Patient Tolerated: Well Instructions Provided: Adjustment of device, Care of device  [name] 04/03/2023, 5:50 PM

## 2023-04-03 NOTE — Anesthesia Preprocedure Evaluation (Signed)
Anesthesia Evaluation  Patient identified by MRN, date of birth, ID band Patient awake    Reviewed: Allergy & Precautions, NPO status , Patient's Chart, lab work & pertinent test results  Airway Mallampati: III  TM Distance: >3 FB Neck ROM: Full    Dental  (+) Teeth Intact, Caps, Dental Advisory Given   Pulmonary asthma , COPD,  COPD inhaler   breath sounds clear to auscultation       Cardiovascular hypertension, Pt. on medications + angina  +CHF  + Valvular Problems/Murmurs MR  Rhythm:Regular Rate:Normal  Echo:  1. Left ventricular ejection fraction, by estimation, is 60 to 65%. The  left ventricle has normal function. The left ventricle has no regional  wall motion abnormalities. There is moderate left ventricular hypertrophy.  Left ventricular diastolic  parameters are indeterminate.   2. Right ventricular systolic function is normal. The right ventricular  size is normal. There is mildly elevated pulmonary artery systolic  pressure. The estimated right ventricular systolic pressure is 45.0 mmHg.   3. Left atrial size was severely dilated.   4. S/p Mitraclip with 2 XTW clips in the A2-P2 position. Severe eccentric  mitral valve regurgitation, originating lateral to the clips. 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 The mitral valve has  been repaired/replaced. Severe  mitral valve regurgitation. The mean mitral valve gradient is 5.4 mmHg  with average heart rate of 62 bpm. There is a Mitra-Clip present in the  mitral position. Procedure Date: 02/15/2022.   5. Tricuspid valve regurgitation is mild to moderate.   6. The aortic valve is grossly normal. Aortic valve regurgitation is  trivial. No aortic stenosis is present.   7. Iatrogenic atrial septal defect s/p transeptal puncture.. Evidence of  atrial level shunting detected by color flow Doppler. There is a atrial  septal defect with predominantly  left to right shunting across the atrial  septum.   8. The inferior vena cava is normal in size with greater than 50%  respiratory variability, suggesting right atrial pressure of 3 mmHg.     Neuro/Psych   Anxiety      Neuromuscular disease    GI/Hepatic Neg liver ROS,GERD  ,,  Endo/Other  negative endocrine ROS    Renal/GU Renal disease     Musculoskeletal  (+) Arthritis ,    Abdominal   Peds  Hematology negative hematology ROS (+)   Anesthesia Other Findings   Reproductive/Obstetrics                             Anesthesia Physical Anesthesia Plan  ASA: 4  Anesthesia Plan: General   Post-op Pain Management: Minimal or no pain anticipated   Induction: Intravenous  PONV Risk Score and Plan: 3 and Ondansetron and Treatment may vary due to age or medical condition  Airway Management Planned: Oral ETT  Additional Equipment: Arterial line  Intra-op Plan:   Post-operative Plan: Extubation in OR  Informed Consent: I have reviewed the patients History and Physical, chart, labs and discussed the procedure including the risks, benefits and alternatives for the proposed anesthesia with the patient or authorized representative who has indicated his/her understanding and acceptance.     Dental advisory given  Plan Discussed with: CRNA  Anesthesia Plan Comments:        Anesthesia Quick Evaluation

## 2023-04-03 NOTE — Interval H&P Note (Signed)
History and Physical Interval Note:  04/03/2023 10:55 AM  Crystal Bass  has presented today for surgery, with the diagnosis of severe mitral insufficiency.  The various methods of treatment have been discussed with the patient and family. After consideration of risks, benefits and other options for treatment, the patient has consented to  Procedure(s): MITRAL VALVE REPAIR (N/A) TRANSESOPHAGEAL ECHOCARDIOGRAM (N/A) as a surgical intervention.  The patient's history has been reviewed, patient examined, no change in status, stable for surgery.  I have reviewed the patient's chart and labs.  Questions were answered to the patient's satisfaction.     Tonny Bollman

## 2023-04-03 NOTE — Op Note (Signed)
    PROCEDURE:  Transcatheter edge to edge mitral valve repair (TEER) INDICATION: Severe symptomatic mitral regurgitation (Stage D)  SURGEON:  Tonny Bollman, MD CO-SURGEON:  Alverda Skeans, MD  PROCEDURAL DETAILS: General anesthesia is induced.  The patient is prepped and draped.  Baseline transesophageal echo images are obtained and confirm appropriate anatomy for transcatheter edge-to-edge mitral valve repair.  Using vascular ultrasound guidance, the right common femoral vein is accessed via a front wall puncture.  2 Perclose sutures are deployed and an 8 [place] sheath is inserted.  A versa cross wire is advanced into the SVC.  Heparin is administered and a therapeutic ACT is achieved.  Transseptal puncture is performed over the mid posterior portion of the fossa.  The septum is dilated while the MitraClip steerable guide catheter is prepped.  After progressively dilating the right common femoral vein, the 24 French MitraClip steerable guide catheter is inserted and advanced across the interatrial septum.  The dilator and the versa cross wire were carefully removed and the guide tip is appropriately positioned approximately 3 cm across the interatrial septum.  A MitraClip XTW device is prepped per protocol.  The device is inserted through the steerable guide catheter with caution taken to avoid air entrapment.  The MitraClip device is positioned above the mitral valve after applying appropriate curve to the guide catheter.  The MitraClip device is then oriented coaxially with the anterior and posterior leaflets of the mitral valve.  Low tidal volume ventilation is initiated.  The clip device is advanced across the mitral valve and pulled back until capture of both the anterior and posterior leaflets is achieved.  Grippers are dropped and the clip is closed under TEE guidance.  Careful TEE assessment is performed and reduction in mitral valve regurgitation is felt to be appropriate.  Leaflet insertion is  verified using 3D imaging.  The MitraClip device is deployed using normal technique and the clip delivery system is removed.  After full TEE assessment, the procedural result is felt to be adequate with reduction of mitral regurgitation to 2+.   CONCLUSION: Successful transcatheter edge-to-edge mitral valve repair under fluoroscopic and echo guidance, reducing baseline 4+ mitral regurgitation to 2+, clip positioned A2/P2 with normalization of pulmonary vein flow and decrease in V-wave amplitude from ~60 to 28-81mmHg.Orbie Pyo 04/03/2023 2:02 PM

## 2023-04-04 ENCOUNTER — Encounter (HOSPITAL_COMMUNITY): Payer: Self-pay | Admitting: Cardiovascular Disease

## 2023-04-04 ENCOUNTER — Inpatient Hospital Stay (HOSPITAL_COMMUNITY): Payer: Medicare PPO

## 2023-04-04 DIAGNOSIS — I5032 Chronic diastolic (congestive) heart failure: Secondary | ICD-10-CM

## 2023-04-04 DIAGNOSIS — Z954 Presence of other heart-valve replacement: Secondary | ICD-10-CM

## 2023-04-04 DIAGNOSIS — I5033 Acute on chronic diastolic (congestive) heart failure: Secondary | ICD-10-CM

## 2023-04-04 DIAGNOSIS — Z9889 Other specified postprocedural states: Secondary | ICD-10-CM

## 2023-04-04 DIAGNOSIS — Z95818 Presence of other cardiac implants and grafts: Secondary | ICD-10-CM

## 2023-04-04 LAB — BASIC METABOLIC PANEL
Anion gap: 9 (ref 5–15)
BUN: 14 mg/dL (ref 8–23)
CO2: 28 mmol/L (ref 22–32)
Calcium: 8.7 mg/dL — ABNORMAL LOW (ref 8.9–10.3)
Chloride: 98 mmol/L (ref 98–111)
Creatinine, Ser: 0.69 mg/dL (ref 0.44–1.00)
GFR, Estimated: >60 mL/min (ref >60)
Glucose, Bld: 113 mg/dL — ABNORMAL HIGH (ref 70–99)
Potassium: 3.9 mmol/L (ref 3.5–5.1)
Sodium: 135 mmol/L (ref 135–145)

## 2023-04-04 LAB — CBC
HCT: 35 % — ABNORMAL LOW (ref 36.0–46.0)
Hemoglobin: 11.4 g/dL — ABNORMAL LOW (ref 12.0–15.0)
MCH: 32.7 pg (ref 26.0–34.0)
MCHC: 32.6 g/dL (ref 30.0–36.0)
MCV: 100.3 fL — ABNORMAL HIGH (ref 80.0–100.0)
Platelets: 123 10*3/uL — ABNORMAL LOW (ref 150–400)
RBC: 3.49 MIL/uL — ABNORMAL LOW (ref 3.87–5.11)
RDW: 13.5 % (ref 11.5–15.5)
WBC: 4.4 10*3/uL (ref 4.0–10.5)
nRBC: 0 % (ref 0.0–0.2)

## 2023-04-04 LAB — ECHOCARDIOGRAM COMPLETE
Area-P 1/2: 1.67 cm2
Height: 62 in
MV VTI: 1.14 cm2
S' Lateral: 3 cm
Weight: 1920 oz

## 2023-04-04 LAB — POCT ACTIVATED CLOTTING TIME
Activated Clotting Time: 196 seconds
Activated Clotting Time: 228 seconds
Activated Clotting Time: 255 seconds

## 2023-04-04 NOTE — Progress Notes (Addendum)
[facility]   MULTIDISCIPLINARY HEART VALVE TEAM  Patient Name: Crystal Bass Date of Encounter: 04/04/2023  Admit date: 04/03/2023  Primary Care Provider: Eustaquio Boyden, MD [facility] Cardiologist: Lorine Bears, MD  [facility] Electrophysiologist:  None   Hospital Problem List     Principal Problem:   S/P mitral valve clip implantation Active Problems:   Essential hypertension   Chronic obstructive airway disease with asthma (HCC)   Chronic dyspnea   Parkinson disease   Orthostatic hypotension   [name] (mycobacterium avium-intracellulare) infection (HCC)   Severe mitral regurgitation   Chronic diastolic (congestive) heart failure (HCC)     Subjective   Having new left leg pain. No shortness of breath or chest pain.   Inpatient Medications    Scheduled Meds:  amLODipine  2.5 mg Oral Daily   aspirin EC  81 mg Oral Daily   Carbidopa-Levodopa ER  1 tablet Oral QHS   Carbidopa-Levodopa ER  1 tablet Oral Q24H   Carbidopa-Levodopa ER  2 tablet Oral Q24H   clonazePAM  0.5 mg Oral QHS   fluticasone furoate-vilanterol  1 puff Inhalation Daily   furosemide  40 mg Oral Daily   gabapentin  300 mg Oral TID   heparin injection (subcutaneous)  5,000 Units Subcutaneous Q8H   pantoprazole  20 mg Oral Daily   potassium chloride SA  20 mEq Oral Daily   sodium chloride flush  3 mL Intravenous Q12H   Continuous Infusions:  sodium chloride     PRN Meds: sodium chloride, [COMPLETED] acetaminophen **FOLLOWED BY** acetaminophen, hydrALAZINE, labetalol, ondansetron (ZOFRAN) IV, sodium chloride flush   Vital Signs    Vitals:   04/04/23 0000 04/04/23 0319 04/04/23 0736 04/04/23 0816  BP: 113/66 121/70 126/64   Pulse: (!) 53 (!) 54 80 66  Resp: (!) 22 19 12 18   Temp:  98.4 F (36.9 C) 98.5 F (36.9 C)   TempSrc:  Oral Oral   SpO2: 94% 94% 90% 93%  Weight:      Height:        Intake/Output Summary (Last 24 hours) at 04/04/2023 1053 Last data filed at  04/03/2023 2314 Gross per 24 hour  Intake 1120 ml  Output 1300 ml  Net -180 ml   Filed Weights   04/03/23 0910  Weight: 54.4 kg    Physical Exam    GEN: elderly and frail. Malnourished appearing. HEENT: Grossly normal.  Neck: Supple, no JVD, carotid bruits, or masses. Cardiac: RRR, 3/6 holosystolic murmur, heard at apex. No rubs, or gallops. No clubbing, cyanosis, edema.   Respiratory:  Respirations regular and unlabored, clear to auscultation bilaterally. GI: Soft, nontender, nondistended, BS + x 4. MS: no deformity or atrophy. Left leg deviated in.  Skin: warm and dry, no rash.  Groin site clear without hematoma or ecchymosis  Neuro:  Strength and sensation are intact. Psych: AAOx3.  Normal affect.  Labs    CBC Recent Labs    04/03/23 1611 04/04/23 0026  WBC 4.8 4.4  HGB 12.2 11.4*  HCT 37.5 35.0*  MCV 101.1* 100.3*  PLT 115* 123*   Basic Metabolic Panel Recent Labs    16/10/96 1611 04/04/23 0026  NA  --  135  K  --  3.9  CL  --  98  CO2  --  28  GLUCOSE  --  113*  BUN  --  14  CREATININE 0.74 0.69  CALCIUM  --  8.7*   Liver Function Tests No results  for input(s): "AST", "ALT", "ALKPHOS", "BILITOT", "PROT", "ALBUMIN" in the last 72 hours. No results for input(s): "LIPASE", "AMYLASE" in the last 72 hours. Cardiac Enzymes No results for input(s): "CKTOTAL", "CKMB", "CKMBINDEX", "TROPONINI" in the last 72 hours. BNP Invalid input(s): "POCBNP" D-Dimer No results for input(s): "DDIMER" in the last 72 hours. Hemoglobin A1C No results for input(s): "HGBA1C" in the last 72 hours. Fasting Lipid Panel No results for input(s): "CHOL", "HDL", "LDLCALC", "TRIG", "CHOLHDL", "LDLDIRECT" in the last 72 hours. Thyroid Function Tests No results for input(s): "TSH", "T4TOTAL", "T3FREE", "THYROIDAB" in the last 72 hours.  Invalid input(s): "FREET3"  Telemetry    Sinus with PACs and PVCs - Personally Reviewed  ECG    Sinus brady with LVH, HR 58  - Personally  Reviewed  Radiology    CARDIAC CATHETERIZATION  Result Date: 04/03/2023 Successful TEER of the mitral valve with a MitraClip G4 XTW device, positioned A2/P2, lateral to the indwelling Clip devices MR reduced from 4+ to 2+ post-procedure   EP STUDY  Result Date: 04/03/2023 Successful TEER of the mitral valve with a MitraClip G4 XTW device, positioned A2/P2, lateral to the indwelling Clip devices MR reduced from 4+ to 2+ post-procedure   ECHO TEE  Result Date: 04/03/2023    TRANSESOPHOGEAL ECHO REPORT   Patient Name:   Crystal Bass Date of Exam: 04/03/2023 Medical Rec #:  [id number]         Height:       62.0 in Accession #:    [id number]        Weight:       117.0 lb Date of Birth:  03-21-1941         BSA:          1.522 m Patient Age:    82 years          BP:           132/97 mmHg Patient Gender: F                 HR:           70 bpm. Exam Location:  Inpatient Procedure: Transesophageal Echo, 3D Echo, Color Doppler and Cardiac Doppler Indications:     Mitral Regurgitation i34.1  History:         Patient has prior history of Echocardiogram examinations, most                  recent 02/11/2023. CHF, COPD; Risk Factors:Hypertension. 2 XTW                  MitraClips placed 02/15/22.  Sonographer:     Irving Burton Senior RDCS Referring Phys:  [phone] Patra Gherardi Diagnosing Phys: Riley Lam MD  Sonographer Comments: MitraClip Procedure PROCEDURE: After discussion of the risks and benefits of a TEE, an informed consent was obtained from the patient. The transesophogeal probe was passed without difficulty through the esophogus of the patient. Sedation performed by different physician. The patient was monitored while under deep sedation. The patient developed no complications during the procedure.  IMPRESSIONS  1. 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 Two XTW Mitral Clips had bee placed at A2-P2 but were not, prior to procedure in parallel. 4+,  severe mitral regurgitations with related to prolapse with posteriorly directed jet, systolic flow reversal of the left sided pulmonary veins. Prior XTW clips well seated, most of the mitral regurgitation was lateral to the mTEER. Mean gradient  7.5 mm Hg at a heart rate of 61 bpm; double orifice area 5.26 cm2. Posterior leaflet length 1.14 cm.     A transeptal puncture was performed superior to the prior, residual ASD. An XTW Mitral Clip was placed lateral to A2P2. At time of leaflet capture, normalization of left sided pulmonary vein flow.     At time of deployment mild to moderate mitral regurgitation. Systolic blunting of the left sided pulmonary vein flow. Mean gradient 3 mm Hg. Improvement of mitral regurgitation from 4+ to 2+. Two residual iatrogenic ASDs are noted.. The mitral valve is abnormal. Severe mitral valve regurgitation. No evidence of mitral stenosis.  2. Left ventricular ejection fraction, by estimation, is 60 to 65%. The left ventricle has normal function.  3. Right ventricular systolic function is normal. The right ventricular size is normal.  4. Left atrial size was severely dilated. No left atrial/left atrial appendage thrombus was detected.  5. Right atrial size was moderately dilated.  6. The aortic valve is tricuspid. Aortic valve regurgitation is not visualized. No aortic stenosis is present.  7. Evidence of atrial level shunting detected by color flow Doppler. FINDINGS  Left Ventricle: Left ventricular ejection fraction, by estimation, is 60 to 65%. The left ventricle has normal function. The left ventricular internal cavity size was normal in size. Right Ventricle: The right ventricular size is normal. Right vetricular wall thickness was not well visualized. Right ventricular systolic function is normal. Left Atrium: Left atrial size was severely dilated. No left atrial/left atrial appendage thrombus was detected. Right Atrium: Right atrial size was moderately dilated. Pericardium:  Trivial pericardial effusion is present. Mitral Valve: Prior to the procedure, a myxomatous mitral valve was present with predominiantly anterior prolapse. Two XTW Mitral Clips had bee placed at A2-P2 but were not, prior to procedure in parallel. 4+, severe mitral regurgitations with related to  prolapse with posteriorly directed jet, systolic flow reversal of the left sided pulmonary veins. Prior XTW clips well seated, most of the mitral regurgitation was lateral to the mTEER. Mean gradient 7.5 mm Hg at a heart rate of 61 bpm; double orifice area 5.26 cm2. Posterior leaflet length 1.14 cm. A transeptal puncture was performed superior to the prior, residual ASD. An XTW Mitral Clip was placed lateral to A2P2. At time of leaflet capture, normalization of left sided pulmonary vein flow. At time of deployment mild to moderate mitral regurgitation. Systolic blunting of the left sided pulmonary vein flow. Mean gradient 3 mm Hg. Improvement of mitral regurgitation from 4+ to 2+. Two residual iatrogenic ASDs are noted. The mitral valve is abnormal. Severe mitral valve regurgitation. No evidence of mitral valve stenosis. Tricuspid Valve: The tricuspid valve is normal in structure. Tricuspid valve regurgitation is mild . No evidence of tricuspid stenosis. Aortic Valve: The aortic valve is tricuspid. Aortic valve regurgitation is not visualized. No aortic stenosis is present. Pulmonic Valve: The pulmonic valve was normal in structure. Pulmonic valve regurgitation is not visualized. No evidence of pulmonic stenosis. Aorta: The aortic root, ascending aorta, aortic arch and descending aorta are all structurally normal, with no evidence of dilitation or obstruction. There is minimal (Grade I) plaque involving the descending aorta. IAS/Shunts: Evidence of atrial level shunting detected by color flow Doppler. Additional Comments: Spectral Doppler performed. MR PISA:        30.41 cm MR PISA Radius: 2.20 cm Riley Lam MD  Electronically signed by Riley Lam MD Signature Date/Time: 04/03/2023/2:36:55 PM    Final  Cardiac Studies   04/03/23 Conclusion Successful TEER of the mitral valve with a MitraClip G4 XTW device, positioned A2/P2, lateral to the indwelling Clip devices   MR reduced from 4+ to 2+ post-procedure   _____________________  Echo 04/04/23: completed but pending formal read.   Patient Profile     BRENNAH HIMELRIGHT is a 82 y.o. adult with a history of pulmonary embolus 2019, Parkinson's disease complicated by orthostatic hypotension, [name] infection with chronic lung disease, remote mastectomy for breast cancer, and severe mitral regurgitation s/p mTEER 02/15/22 with moderate to severe MR who presented to [facility] on 04/03/23 for planned redo mTEER.  Assessment & Plan    Severe MR: s/p successful TEER of the mitral valve with a MitraClip G4 XTW device, positioned A2/P2, lateral to the indwelling Clip devices. MR reduced from 4+ to 2+ post-procedure. Post op echo completed but not formally read yet. MR looks moderate. Continued on home baby aspirin 81mg  daily. Keep inpatient given worsening hip pain. PT consult.    Acute on chronic diastolic CHF: BNP 681 on PAT labs. Had hypoxia post procedure that improved with IV lasix 20mg . Creat stable today. Continue on home Lasix 40 mg daily/Kdur daily.    Parkinson's disease complicated by orthostatic hypotension: BP soft. Will hold home Norvasc 2.5mg  daily. Continue Sinemet.   Left leg pain: has a history of hip fracture on this side. Pain is actually new this admission and limiting her mobility. ? If related to position from surgery. Will get PT consult to assess need for higher level of care at [facility] facility (currently in assisted living). Continue pain control with Tylenol.   Byrd Hesselbach, PA-C  04/04/2023, 10:53 AM  Pager [phone]  Patient seen, examined. Available data reviewed. Agree with findings, assessment, and plan as  outlined by Carlean Jews, PA-C.  The patient is independently interviewed and examined.  She is alert, oriented, no distress.  Lungs are clear, heart is regular rate and rhythm with 2/6 holosystolic murmur at the left lower sternal border and apex, abdomen soft and nontender, extremities have no edema, the right groin site is clear.  The patient's echocardiogram is reviewed and demonstrates moderate residual MR with good reduction compared to her baseline echo.  No other significant abnormality seen.  Clinically, she has had signs of acute on chronic heart failure with preserved ejection fraction related to her severe mitral regurgitation.  Her left atrial pressure was severely elevated yesterday and her BNP is elevated.  In addition, she has had progressive symptoms of dyspnea and required IV diuretic therapy post procedure.  Clinically improved now following mitral valve repair and IV diuretic therapy.  Patient medically stable for discharge.  She did well with physical therapy this morning.  Once her disposition is sorted out, she is ready for discharge.  She has had some problems with left leg pain as above.  Follow-up arranged as documented.  Tonny Bollman, M.D. 04/04/2023 1:12 PM

## 2023-04-04 NOTE — Progress Notes (Signed)
CARDIAC REHAB PHASE I   PRE:  Rate/Rhythm: 63 NSR  BP:  Sitting: 91/51 asymptomatic      SaO2: 93 RA  MODE:  Ambulation: 16 ft   AD:  RW  POST:  Rate/Rhythm: 78 NSR  BP:  Sitting: 102/52      SaO2: 91-93 RA  Pt amb with contact guard assistance using gait belt, pt denies CP and SOB during amb and was returned to room w/o complaint. Distance limited by L hip pain.   Pt educated on restrictions, walking as tolerated, site care, HH diet, CRPII may not be appropriate as pt is living in assisted living and is currently dealing with hip pain that limits ambulation. Pt did complete program at [facility] in the past and enjoyed her experience so I will send another referral [facility].   Faustino Congress  ACSM-CEP 9:57 AM 04/04/2023    Service time is from 0920 to 1008.

## 2023-04-04 NOTE — Plan of Care (Signed)

## 2023-04-04 NOTE — TOC Initial Note (Signed)
Transition of Care [facility]) - Initial/Assessment Note    Patient Details  Name: Crystal Bass MRN: [id number] Date of Birth: [birth year]  [facility] CM/SW Contact:    Leander Rams, LCSW Phone Number: 04/04/2023, 2:11 PM  Clinical Narrative:                 CSW confirmed with [facility] pt is ALF. A fl2 is not needed for pt to return and ALF will set up transportation for dc.    TOC will remain available.      Patient Goals and CMS Choice            Expected Discharge Plan and Services         Expected Discharge Date: 04/04/23                                    Prior Living Arrangements/Services                       Activities of Daily Living Home Assistive Devices/Equipment: Dan Humphreys (specify type), Shower chair without back, Blood pressure cuff, Scales ADL Screening (condition at time of admission) Patient's cognitive ability adequate to safely complete daily activities?: Yes Is the patient deaf or have difficulty hearing?: No Does the patient have difficulty seeing, even when wearing glasses/contacts?: Yes Does the patient have difficulty concentrating, remembering, or making decisions?: No Patient able to express need for assistance with ADLs?: Yes Does the patient have difficulty dressing or bathing?: Yes Independently performs ADLs?: Yes (appropriate for developmental age) Does the patient have difficulty walking or climbing stairs?: Yes Weakness of Legs: Left Weakness of Arms/Hands: None  Permission Sought/Granted                  Emotional Assessment              Admission diagnosis:  S/P mitral valve clip implantation [A21.308, Z95.818] Patient Active Problem List   Diagnosis Date Noted   Acute on chronic heart failure with preserved ejection fraction (HFpEF) (HCC) 04/04/2023   Hypervolemia 02/08/2023   Chronic diastolic (congestive) heart failure (HCC) 08/20/2022   Closed left hip fracture (HCC) 08/19/2022    Severe mitral regurgitation 02/15/2022   S/P mitral valve clip implantation 02/15/2022   Ovarian tumor of borderline malignancy, right 10/28/2020   Angioedema 05/04/2020   Chronic neuropathic pain 01/27/2020   Acute midline low back pain with right-sided sciatica 10/26/2019   [name] (mycobacterium avium-intracellulare) infection (HCC) 11/01/2018   History of pulmonary embolism 10/31/2018   Orthostatic hypotension 08/25/2018   Chronic fatigue 10/23/2017   Parkinson disease 01/10/2016   Hypercalcemia 09/14/2013   Osteoporosis, post-menopausal 03/14/2012   Chronic dyspnea 03/28/2011   Chronic obstructive airway disease with asthma (HCC) 03/25/2008   History of left breast cancer 03/10/2008   Essential hypertension 02/10/2007   PCP:  Eustaquio Boyden, MD Pharmacy:   [facility] - [place], [place] - [address] [place] [address] Phone: [phone] Fax: [phone]  [facility] - [place], [place] - [address] [place] [address] Phone: [phone] Fax: [phone]     Social Determinants of Health (SDOH) Social History: SDOH Screenings   Food Insecurity: No Food Insecurity (11/28/2022)  Housing: Low Risk  (11/28/2022)  Transportation Needs: No Transportation Needs (11/28/2022)  Recent Concern: Transportation Needs - Unmet Transportation Needs (11/22/2022)  Utilities: Not At Risk (11/22/2022)  Alcohol Screen: Low Risk  (08/13/2022)  Depression (PHQ2-9): Low Risk  (12/07/2022)  Financial Resource Strain: Low Risk  (08/13/2022)  Physical Activity: Insufficiently Active (08/13/2022)  Social Connections: Socially Isolated (08/13/2022)  Stress: No Stress Concern Present (08/13/2022)  Tobacco Use: Low Risk  (04/04/2023)   SDOH Interventions:     Readmission Risk Interventions     No data to display          Oletta Lamas, MSW, LCSWA, LCASA Transitions of Care  Clinical Social Worker I

## 2023-04-04 NOTE — Anesthesia Postprocedure Evaluation (Signed)
Anesthesia Post Note  Patient: Crystal Bass  Procedure(s) Performed: MITRAL VALVE REPAIR TRANSESOPHAGEAL ECHOCARDIOGRAM     Patient location during evaluation: PACU Anesthesia Type: General Level of consciousness: awake and alert Pain management: pain level controlled Vital Signs Assessment: post-procedure vital signs reviewed and stable Respiratory status: spontaneous breathing, nonlabored ventilation, respiratory function stable and patient connected to nasal cannula oxygen Cardiovascular status: blood pressure returned to baseline and stable Postop Assessment: no apparent nausea or vomiting Anesthetic complications: no   There were no known notable events for this encounter.               Shelton Silvas

## 2023-04-04 NOTE — Progress Notes (Signed)
  Echocardiogram 2D Echocardiogram has been performed.  Delcie Roch 04/04/2023, 10:56 AM

## 2023-04-05 ENCOUNTER — Telehealth: Payer: Self-pay

## 2023-04-05 NOTE — Progress Notes (Unsigned)
[facility]   MULTIDISCIPLINARY HEART VALVE CLINIC                                     Cardiology Office Note:    Date:  04/09/2023   ID:  Crystal Bass, DOB [birth year], MRN [id number]  PCP:  Eustaquio Boyden, MD  [facility] Cardiologist:  Lorine Bears, MD / Dr. Excell Seltzer, MD and Dr. Lynnette Caffey, MD [facility] Ill)   Referring MD: Eustaquio Boyden, MD   Chief Complaint  Patient presents with   [facility] s/p TEER   History of Present Illness:    Crystal Bass is an 82 y.o. adult with a hx of pulmonary embolus 2019, Parkinson's disease complicated by orthostatic hypotension, [name] infection with chronic lung disease, remote mastectomy for breast cancer, and severe mitral regurgitation s/p mTEER 02/15/22 found to have residual moderate to severe MR and is now s/p repeat TEER 04/03/23. She is being see today for TOC follow up.    Crystal Bass was hospitalized in 10/2018 with atypical chest pain. Initial CT of the chest showed single pulmonary embolism in the apical segment branch of the left upper lobe. There was also evidence of bronchiectasis. Repeat CT after 2 days showed resolution of the PE. She was treated with Eliquis for 3 months. She had worsening exertional dyspnea in 2022 and underwent echo in 08/2021 which showed normal LV systolic function, G2DD, small pericardial effusion, and moderate to severe mitral regurgitation due to significant prolapse of the anterior mitral leaflet. She subsequently underwent R/LHC in 01/2022 which showed normal coronary arteries, normal LV systolic function, and moderate to severe MR with giant V waves (34 mmHg). She underwent MitraClip placement x2 in March 2023 though had minimal improvement in symptoms with subsequent echo showing evidence of severe mitral regurgitation which was confirmed by TEE. She was scheduled for placement of a third MitraClip in 05/2022 however, patient canceled given risks of mitral stenosis.    She was then admitted 07/2022  following a fall with closed left hip fracture requiring left hip hemiarthroplasty. She was seen by our team during admission. Follow-up echo showed an EF of 60 to 65% with grade 3 diastolic dysfunction, mild LVH, and moderate to severe MR and moderate mitral stenosis.   In follow up with Dr. Excell Seltzer on 03/14/23 and complained of continued exertional shortness of breath and fatigue and wanted to proceed with a third clip placement. She was subsequently seen by Dr. Leafy Ro for consideration of mini mitral valve replacement. Her anatomy was felt to be prohibitive for mini thoracotomy and she had an elevated risk for surgical MVR given age, parkinsons disease and frailty. Plans were made for second mTEER attempt, which was set up for 04/03/23.  She is now s/p successful TEER of the mitral valve with a MitraClip G4 XTW device, positioned A2/P2, lateral to the indwelling prior Clip devices. MR reduced from 4+ to 2+ post-procedure. Post op echo showed EF 60%, mild to moderate residual MR with a mean gradient of 4 mmHg. She was continued on ASA 81mg . She was kept in the hospital an extra day due to chronic hip pain. She was discharged back to [facility] with skilled nursing care (for one week) then back to her assisted living home.   She comes today with a close friend of hers. She reports since hospital discharge she has felt much improvement with her breathing. She is  able to walk longer distances without needing to stop and rest. She continues to walk with a rolling walker however this is mainly for stability. She is asking about participating in cardiac rehabilitation as she feels that she could now tolerate this. She denies chest pain, palpitations, LE edema, orthopnea, dizziness, bleeding in stool or urine, or syncope.   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 10/2021 Cath: Nl cors.   Arthritis    Asthma    Breast  cancer (HCC) 2002   infiltrative ductal carcinoma    Cataract 2019   Chronic heart failure with preserved ejection fraction (HFpEF) (HCC)    COPD (chronic obstructive pulmonary disease) (HCC) 07/2007   Ductal carcinoma of breast, estrogen receptor positive, stage 1 (HCC) 09/16/2012   Endometriosis    GERD (gastroesophageal reflux disease)    Hypercalcemia 09/14/2013   Hypertension    Lesion of breast 1992   right, benign   Lung disease    secondary to [name] infection   Mastalgia 05/1994   Mitral regurgitation    a. 10/2021 RHC: PCWP V wave of 34; b. 01/2022 TEE: Severe flail/prolapse of large area of A2 scallop with severe posteriorly directed MR; c. 01/2022 TEER w /Mitraclip XTW x 2; d. 07/2022 Echo: EF 60-65%. No rwma, mild LVH, GrIII DD. Sev dil LA. Mod elev PASP. Mod-Sev MR. Mod MS.   Osteoporosis, post-menopausal 03/14/2012   Ovarian tumor of borderline malignancy, right 2004   Palpitations    a. 02/2022 Zio: Predominantly sinus rhythm at 67 (52-171).  2 runs of NSVT -fastest 9 beats at 167.  35 SVT runs-longest 19 beats, fastest 171x6 beats.   Pancreatitis    secondary to Cholelithiasis   Parkinson disease 02/10/2015   Post-thoracotomy pain syndrome    Pseudomonas pneumonia (HCC) 03/2008   Status post implantation of mitral valve leaflet clip 02/15/2022   s/p XTW MitraClip x2 by Dr. Excell Seltzer in the A2/P2: b. 04/03/23 s/p third mitraclip implantation.   Traumatic closed fracture of distal clavicle with minimal displacement, left, initial encounter 06/2021   [facility]   Uterine fibroid    Vitamin D deficiency     Past Surgical History:  Procedure Laterality Date   ABDOMINAL HYSTERECTOMY     & BSO for Mucinous borderline tumor of R ovary 2004   APPENDECTOMY  2004   BREAST BIOPSY  08/2004   right breast-benign   BREAST LUMPECTOMY  1992   benign   BUBBLE STUDY  12/27/2021   Procedure: BUBBLE STUDY;  Surgeon: Sande Rives, MD;  Location: [facility] ENDOSCOPY;  Service:  Cardiovascular;;   CARDIAC CATHETERIZATION  2007   essentially negation for significant CAD   CHOLECYSTECTOMY  2001   COLONOSCOPY  2014   Dr Marina Goodell , due 2019   COLONOSCOPY  09/2018   2 TA removed, int hem, no f/u needed (Dr Marina Goodell)    ESOPHAGOGASTRODUODENOSCOPY  09/2018   GERD with esophagitis and stricture dilated, antral gastritis negative for H pylori Marina Goodell)   HIP ARTHROPLASTY Left 08/21/2022   Procedure: ARTHROPLASTY BIPOLAR HIP (HEMIARTHROPLASTY);  Surgeon: Juanell Fairly, MD;  Location: [facility] ORS;  Service: Orthopedics;  Laterality: Left;   LUNG BIOPSY  2002   [name], Dr Edwyna Shell   MASTECTOMY MODIFIED RADICAL Left 2002   oral chemotheraphy (tamoxifen then Armidex) no radiation, Dr.[name]   MITRAL VALVE REPAIR N/A 02/15/2022  Procedure: MITRAL VALVE REPAIR;  Surgeon: Tonny Bollman, MD;  Location: [facility] INVASIVE CV LAB;  Service: Cardiovascular;  Laterality: N/A;   MITRAL VALVE REPAIR N/A 04/03/2023   Procedure: MITRAL VALVE REPAIR;  Surgeon: Tonny Bollman, MD;  Location: [facility] INVASIVE CV LAB;  Service: Cardiovascular;  Laterality: N/A;   RIGHT/LEFT HEART CATH AND CORONARY ANGIOGRAPHY Bilateral 11/14/2021   Procedure: RIGHT/LEFT HEART CATH AND CORONARY ANGIOGRAPHY;  Surgeon: Iran Ouch, MD;  Location: [facility] INVASIVE CV LAB;  Service: Cardiovascular;  Laterality: Bilateral;   TEE WITHOUT CARDIOVERSION N/A 12/29/2018   Procedure: TRANSESOPHAGEAL ECHOCARDIOGRAM (TEE);  Surgeon: Iran Ouch, MD;  Location: [facility] ORS;  Service: Cardiovascular;  Laterality: N/A;   TEE WITHOUT CARDIOVERSION N/A 12/27/2021   Procedure: TRANSESOPHAGEAL ECHOCARDIOGRAM (TEE);  Surgeon: Sande Rives, MD;  Location: [facility] ENDOSCOPY;  Service: Cardiovascular;  Laterality: N/A;   TEE WITHOUT CARDIOVERSION N/A 02/15/2022   Procedure: TRANSESOPHAGEAL ECHOCARDIOGRAM (TEE);  Surgeon: Tonny Bollman, MD;  Location: [facility] INVASIVE CV LAB;  Service: Cardiovascular;  Laterality: N/A;   TEE WITHOUT CARDIOVERSION N/A  05/14/2022   Procedure: TRANSESOPHAGEAL ECHOCARDIOGRAM (TEE);  Surgeon: Sande Rives, MD;  Location: [facility] ENDOSCOPY;  Service: Cardiovascular;  Laterality: N/A;   TEE WITHOUT CARDIOVERSION N/A 04/03/2023   Procedure: TRANSESOPHAGEAL ECHOCARDIOGRAM;  Surgeon: Tonny Bollman, MD;  Location: [facility] INVASIVE CV LAB;  Service: Cardiovascular;  Laterality: N/A;   TONSILLECTOMY  1946    Current Medications: Current Meds  Medication Sig   acetaminophen (TYLENOL) 500 MG tablet Take 1,000 mg by mouth 3 (three) times daily.   albuterol (VENTOLIN HFA) 108 (90 Base) MCG/ACT inhaler Inhale 2 puffs into the lungs every 6 (six) hours as needed for wheezing or shortness of breath.   aspirin 81 MG tablet Take 1 tablet (81 mg total) by mouth daily.   calcium carbonate (TUMS - DOSED IN MG ELEMENTAL CALCIUM) 500 MG chewable tablet Chew 2 tablets by mouth daily as needed for indigestion or heartburn.   Carbidopa-Levodopa ER (SINEMET CR) 25-100 MG tablet controlled release TAKE TWO TABLET BY MOUTH AT NINE IN THE MORNING, ONE TAB AT ONE IN THE EVENING AND ONE TAB AT FIVE IN THE EVENING   Cholecalciferol (VITAMIN D3) 25 MCG (1000 UT) CAPS Take 1 capsule (1,000 Units total) by mouth daily.   clonazePAM (KLONOPIN) 0.5 MG tablet TAKE ONE HALF (1/2) A TABLET BY MOUTH AT BEDTIME   diphenhydrAMINE (BENADRYL) 25 MG tablet Take 50 mg by mouth in the morning.   EPINEPHrine 0.3 mg/0.3 mL IJ SOAJ injection Inject 0.3 mg into the muscle as needed for anaphylaxis.   FEROSUL 325 (65 Fe) MG tablet TAKE ONE TABLET BY MOUTH DAILY WITH BREAKFAST   Fluticasone-Umeclidin-Vilant (TRELEGY ELLIPTA) 100-62.5-25 MCG/ACT AEPB Inhale 1 puff into the lungs daily.   furosemide (LASIX) 40 MG tablet TAKE ONE TABLET BY MOUTH ONCE DAILY *NEW DOSE*   gabapentin ([name]) 300 MG capsule TAKE TWO CAPSULES BY MOUTH THREE TIMES DAILY   pantoprazole (PROTONIX) 20 MG tablet Take 1 tablet (20 mg total) by mouth daily.   potassium chloride SA (KLOR-CON M)  20 MEQ tablet TAKE ONE TABLET BY MOUTH ONCE A DAY     Allergies:   Sulfa antibiotics, Sulfonamide derivatives, Topamax [topiramate], Clarithromycin, Hydrocodone, Motrin [ibuprofen], Misc. 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  Tobacco Use   Smoking status: Never   Smokeless tobacco: Never  Vaping Use   Vaping Use: Never used  Substance and Sexual Activity   Alcohol use: No    Alcohol/week: 0.0 standard drinks of alcohol   Drug use: No   Sexual activity: Not Currently    Birth control/protection: Surgical    Comment: TAH/BSO  Other Topics Concern   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    Aunt of Dr Gilmore Laroche    Lives independent living at [facility]    No children    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, 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, 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, 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)    Activity: walking about 1 mile/day, enjoys [employer]    Diet: some water, fruits/vegetables some    Right handed    Social Determinants of Health   Financial Resource Strain: Low Risk  (08/13/2022)   Overall Financial Resource Strain (CARDIA)    Difficulty of Paying Living Expenses: Not hard at all  Food Insecurity: No Food Insecurity (11/28/2022)   Hunger Vital Sign    Worried About Running Out of Food in the Last Year: Never true    Ran Out of Food in the Last Year: Never true  Transportation Needs: No Transportation Needs (11/28/2022)   PRAPARE - Administrator, Civil Service (Medical): No    Lack of Transportation (Non-Medical): No  Recent Concern: Transportation Needs - Unmet Transportation Needs  (11/22/2022)   PRAPARE - Transportation    Lack of Transportation (Medical): No    Lack of Transportation (Non-Medical): Yes  Physical Activity: Insufficiently Active (08/13/2022)   Exercise Vital Sign    Days of Exercise per Week: 4 days    Minutes of Exercise per Session: 30 min  Stress: No Stress Concern Present (08/13/2022)   [employer] of Occupational Health - Occupational Stress Questionnaire    Feeling of Stress : Not at all  Social Connections: Socially Isolated (08/13/2022)   Social Connection and Isolation Panel [NHANES]    Frequency of Communication with Friends and Family: More than three times a week    Frequency of Social Gatherings with Friends and Family: Once a week    Attends Religious Services: Never    Database 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 or Organizations: No    Attends Banker Meetings: Never    Marital Status: Widowed     Family History: The patient's family history includes Breast cancer in her cousin; Breast cancer (age of onset: 40) in her sister; Cancer in her maternal grandfather, maternal grandmother, maternal uncle, maternal uncle, and maternal uncle; Colon cancer in her sister; Colon cancer (age of onset: 39) in her cousin; Emphysema in her mother; Liver cancer in her maternal aunt; Lung cancer (age of onset: 72) in her cousin; Lung cancer (age of onset: 63) in her father; Melanoma in her maternal aunt; Myasthenia gravis in her paternal aunt; Osteoporosis in her paternal aunt; Parkinson's disease in her cousin; Stroke in her paternal aunt. There is no history of Diabetes, Heart disease, Stomach cancer, or Ulcerative colitis.  ROS:   Please see the history of present illness.    All other systems reviewed and are negative.  EKGs/Labs/Other Studies Reviewed:    The following studies were reviewed today:  04/03/23 Conclusion Successful TEER of the mitral valve with a  MitraClip G4 XTW device, positioned A2/P2, lateral to the indwelling Clip devices   MR  reduced from 4+ to 2+ post-procedure _____________   Echo 04/04/23:  IMPRESSIONS   1. S/p additonal Mitra-Clip procedure. Mild to moderate mitral  regurgitation. There is systolic blunting of the right sided pulmonary  vein flow. Triple orifice area 2D planimetry of 3.03 cm2. There are two  regurgintant jets; a medial jet and a central  jet between the three Mitra-Clips. There are two iatrogenic ASDs with left  to right flow. The mitral valve has been repaired/replaced. Mild to  moderate mitral valve regurgitation. The mean mitral valve gradient is 4.0  mmHg with average heart rate of 58  bpm. There is a Mitra-Clip present in the mitral position. Procedure Date:  04/03/23.   2. Left ventricular ejection fraction, by estimation, is 60 to 65%. The  left ventricle has normal function. The left ventricle has no regional  wall motion abnormalities. There is mild concentric left ventricular  hypertrophy. Left ventricular diastolic  parameters are indeterminate.   3. Right ventricular systolic function is normal. The right ventricular  size is moderately enlarged. There is moderately elevated pulmonary artery  systolic pressure.   4. Left atrial size was severely dilated.   5. Evidence of atrial level shunting detected by color flow Doppler.   6. Right atrial size was mild to moderately dilated.   7. The aortic valve is tricuspid. Aortic valve regurgitation is trivial.  No aortic stenosis is present.   8. The inferior vena cava is dilated in size with >50% respiratory  variability, suggesting right atrial pressure of 8 mmHg.   EKG:  EKG is not ordered today.    Recent Labs: 04/30/2022: NT-Pro BNP 403 12/07/2022: Magnesium 2.2 04/01/2023: ALT <5; B Natriuretic Peptide 681.9 04/04/2023: BUN 14; Creatinine, Ser 0.69; Hemoglobin 11.4; Platelets 123; Potassium 3.9; Sodium 135   Recent Lipid Panel    Component Value Date/Time   CHOL 171 08/08/2021 1320   TRIG 100.0 08/08/2021 1320   HDL 53.30  08/08/2021 1320   CHOLHDL 3 08/08/2021 1320   VLDL 20.0 08/08/2021 1320   LDLCALC 97 08/08/2021 1320   LDLDIRECT 135.0 08/16/2009 1359   Physical Exam:    VS:  BP 116/64   Pulse 71   Ht 5\' 2"  (1.575 m)   Wt 113 lb 3.2 oz (51.3 kg)   SpO2 95%   BMI 20.70 kg/m     Wt Readings from Last 3 Encounters:  04/08/23 113 lb 3.2 oz (51.3 kg)  04/03/23 120 lb (54.4 kg)  03/25/23 117 lb (53.1 kg)    General: Well developed, well nourished, NAD Lungs:Clear to ausculation bilaterally. No wheezes, rales, or rhonchi. Breathing is unlabored. Cardiovascular: RRR with S1 S2. + murmur Extremities: No edema.  Neuro: Alert and oriented. No focal deficits. No facial asymmetry. MAE spontaneously. Psych: Responds to questions appropriately with normal affect.    ASSESSMENT/PLAN:    Severe MR: Patient doing well with NYHA class I symptoms s/p successful TEER of the mitral valve with a MitraClip G4 XTW device, positioned A2/P2, lateral to the indwelling Clip devices. MR reduced from 4+ to 2+ post-procedure. Post op echo showed EF 60%, mild to moderate residual MR with a mean gradient of 4 mmHg. Doing very well today with marked improvement in her symptoms. Ok to participate in CRII. Continued ASA 81 daily. Dental SBE discussed and patient already RX'ed coverage. Plan one month follow up with echocardiogram.   Chronic diastolic CHF:  BNP 681 on PAT labs treated with IV Lasix while hospitalized. Appears euvolemic on exam. Reports improvement in LE edema since most recent TEER.   Parkinson's disease complicated by orthostatic hypotension: Stable today at 116/64. Continue to hold Amlodipine 2.5mg  for now. Continue Sinemet.    Left leg pain/osteoarthritis: Reports hx of hip fracture with chronic arthritis. Reports some improvement in pain after discharge with movement.   [name] infection with chronic lung disease: Follows closely with pulmonary. Continue Trelegy, PRN albuterol.  { Medication Adjustments/Labs and  Tests Ordered: Current medicines are reviewed at length with the patient today.  Concerns regarding medicines are outlined above.  No orders of the defined types were placed in this encounter.  No orders of the defined types were placed in this encounter.   Patient Instructions  Medication Instructions:  Your physician recommends that you continue on your current medications as directed. Please refer to the Current Medication list given to you today.  *If you need a refill on your cardiac medications before your next appointment, please call your pharmacy*   Lab Work: NONE If you have labs (blood work) drawn today and your tests are completely normal, you will receive your results only by: MyChart Message (if you have MyChart) OR A paper copy in the mail If you have any lab test that is abnormal or we need to change your treatment, we will call you to review the results.   Testing/Procedures: NONE   Follow-Up: At [facility], you and your health needs are our priority.  As part of our continuing mission to provide you with exceptional heart care, we have created designated Provider Care Teams.  These Care Teams include your primary Cardiologist (physician) and Advanced Practice Providers (APPs -  Physician Assistants and Nurse Practitioners) who all work together to provide you with the care you need, when you need it.  We recommend signing up for the patient portal called "MyChart".  Sign up information is provided on this After Visit Summary.  MyChart is used to connect with patients for Virtual Visits (Telemedicine).  Patients are able to view lab/test results, encounter notes, upcoming appointments, etc.  Non-urgent messages can be sent to your provider as well.   To learn more about what you can do with MyChart, go to [email or web address].    Your next appointment:   KEEP SCHEDULED FOLLOW-UP   Signed, Georgie Chard, NP  04/09/2023 7:18 AM    [facility]

## 2023-04-05 NOTE — Telephone Encounter (Signed)
Spoke with the patient's nurse, Loura Pardon, again. She states the patient's temperature is now 100.6. Reassured her that the temperature is improving. Encouraged her to have the patient stay hydrated and to continue scheduled Tylenol. Fabienne Bruns the office number and reiterated there is an on-call line for the weekend. Rescheduled the patient's TOC visit to 5/13. All were in agreement with plan.

## 2023-04-05 NOTE — Telephone Encounter (Signed)
  HEART AND VASCULAR CENTER   Structural Heart Team  Spoke with the patient and her nurse, Magda Paganini, after being discharged 04/04/2023 s/p mTEER 04/03/2023.  The patient understands to follow up with Georgie Chard on 04/10/2023.  The patient understands discharge instructions? Yes  The patient understands medications and regimen? Yes   Magda Paganini reports groin site looks healthy with no signs/symptoms of infection.   While on the phone, the patient reported a fever this morning of 101.2. She gave the phone to [place] who said the patient was flushed and achy. The temperature was taken prior to taking Tylenol at 0809 this AM and it has not been rechecked. Reviewed recent labs and CXR. Informed her that a slight fever is not uncommon after these procedures and instructed her to recheck the temperature and call back to see if it has improved.

## 2023-04-08 ENCOUNTER — Ambulatory Visit: Payer: Medicare PPO | Attending: Cardiovascular Disease | Admitting: Cardiology

## 2023-04-08 VITALS — BP 116/64 | HR 71 | Ht 62.0 in | Wt 113.2 lb

## 2023-04-08 DIAGNOSIS — I5032 Chronic diastolic (congestive) heart failure: Secondary | ICD-10-CM | POA: Diagnosis not present

## 2023-04-08 DIAGNOSIS — I340 Nonrheumatic mitral (valve) insufficiency: Secondary | ICD-10-CM

## 2023-04-08 DIAGNOSIS — A310 Pulmonary mycobacterial infection: Secondary | ICD-10-CM | POA: Diagnosis not present

## 2023-04-08 DIAGNOSIS — Z9889 Other specified postprocedural states: Secondary | ICD-10-CM

## 2023-04-08 DIAGNOSIS — Z95818 Presence of other cardiac implants and grafts: Secondary | ICD-10-CM

## 2023-04-08 DIAGNOSIS — G20A1 Parkinson's disease without dyskinesia, without mention of fluctuations: Secondary | ICD-10-CM

## 2023-04-08 DIAGNOSIS — I10 Essential (primary) hypertension: Secondary | ICD-10-CM | POA: Diagnosis not present

## 2023-04-08 NOTE — Patient Instructions (Signed)
Medication Instructions:  Your physician recommends that you continue on your current medications as directed. Please refer to the Current Medication list given to you today.  *If you need a refill on your cardiac medications before your next appointment, please call your pharmacy*   Lab Work: NONE If you have labs (blood work) drawn today and your tests are completely normal, you will receive your results only by: MyChart Message (if you have MyChart) OR A paper copy in the mail If you have any lab test that is abnormal or we need to change your treatment, we will call you to review the results.   Testing/Procedures: NONE   Follow-Up: At [facility], you and your health needs are our priority.  As part of our continuing mission to provide you with exceptional heart care, we have created designated Provider Care Teams.  These Care Teams include your primary Cardiologist (physician) and Advanced Practice Providers (APPs -  Physician Assistants and Nurse Practitioners) who all work together to provide you with the care you need, when you need it.  We recommend signing up for the patient portal called "MyChart".  Sign up information is provided on this After Visit Summary.  MyChart is used to connect with patients for Virtual Visits (Telemedicine).  Patients are able to view lab/test results, encounter notes, upcoming appointments, etc.  Non-urgent messages can be sent to your provider as well.   To learn more about what you can do with MyChart, go to [email or web address].    Your next appointment:   KEEP SCHEDULED FOLLOW-UP 

## 2023-04-09 DIAGNOSIS — J4489 Other specified chronic obstructive pulmonary disease: Secondary | ICD-10-CM | POA: Diagnosis not present

## 2023-04-09 DIAGNOSIS — R278 Other lack of coordination: Secondary | ICD-10-CM | POA: Diagnosis not present

## 2023-04-09 DIAGNOSIS — Z741 Need for assistance with personal care: Secondary | ICD-10-CM | POA: Diagnosis not present

## 2023-04-09 DIAGNOSIS — M6281 Muscle weakness (generalized): Secondary | ICD-10-CM | POA: Diagnosis not present

## 2023-04-09 DIAGNOSIS — I340 Nonrheumatic mitral (valve) insufficiency: Secondary | ICD-10-CM | POA: Diagnosis not present

## 2023-04-09 DIAGNOSIS — R2681 Unsteadiness on feet: Secondary | ICD-10-CM | POA: Diagnosis not present

## 2023-04-09 DIAGNOSIS — I5033 Acute on chronic diastolic (congestive) heart failure: Secondary | ICD-10-CM | POA: Diagnosis not present

## 2023-04-09 DIAGNOSIS — R4189 Other symptoms and signs involving cognitive functions and awareness: Secondary | ICD-10-CM | POA: Diagnosis not present

## 2023-04-09 DIAGNOSIS — G20A1 Parkinson's disease without dyskinesia, without mention of fluctuations: Secondary | ICD-10-CM | POA: Diagnosis not present

## 2023-04-10 ENCOUNTER — Ambulatory Visit: Payer: Medicare PPO

## 2023-04-12 DIAGNOSIS — I5033 Acute on chronic diastolic (congestive) heart failure: Secondary | ICD-10-CM | POA: Diagnosis not present

## 2023-04-12 DIAGNOSIS — R4189 Other symptoms and signs involving cognitive functions and awareness: Secondary | ICD-10-CM | POA: Diagnosis not present

## 2023-04-12 DIAGNOSIS — G20A1 Parkinson's disease without dyskinesia, without mention of fluctuations: Secondary | ICD-10-CM | POA: Diagnosis not present

## 2023-04-12 DIAGNOSIS — R2681 Unsteadiness on feet: Secondary | ICD-10-CM | POA: Diagnosis not present

## 2023-04-12 DIAGNOSIS — R278 Other lack of coordination: Secondary | ICD-10-CM | POA: Diagnosis not present

## 2023-04-12 DIAGNOSIS — I340 Nonrheumatic mitral (valve) insufficiency: Secondary | ICD-10-CM | POA: Diagnosis not present

## 2023-04-12 DIAGNOSIS — Z741 Need for assistance with personal care: Secondary | ICD-10-CM | POA: Diagnosis not present

## 2023-04-12 DIAGNOSIS — J4489 Other specified chronic obstructive pulmonary disease: Secondary | ICD-10-CM | POA: Diagnosis not present

## 2023-04-12 DIAGNOSIS — M6281 Muscle weakness (generalized): Secondary | ICD-10-CM | POA: Diagnosis not present

## 2023-04-12 NOTE — Progress Notes (Signed)
LM for pt - already scheduled with RN

## 2023-04-15 DIAGNOSIS — J4489 Other specified chronic obstructive pulmonary disease: Secondary | ICD-10-CM | POA: Diagnosis not present

## 2023-04-15 DIAGNOSIS — B351 Tinea unguium: Secondary | ICD-10-CM | POA: Diagnosis not present

## 2023-04-15 DIAGNOSIS — G20A1 Parkinson's disease without dyskinesia, without mention of fluctuations: Secondary | ICD-10-CM | POA: Diagnosis not present

## 2023-04-15 DIAGNOSIS — I5033 Acute on chronic diastolic (congestive) heart failure: Secondary | ICD-10-CM | POA: Diagnosis not present

## 2023-04-15 DIAGNOSIS — I7091 Generalized atherosclerosis: Secondary | ICD-10-CM | POA: Diagnosis not present

## 2023-04-15 DIAGNOSIS — I340 Nonrheumatic mitral (valve) insufficiency: Secondary | ICD-10-CM | POA: Diagnosis not present

## 2023-04-15 DIAGNOSIS — M6281 Muscle weakness (generalized): Secondary | ICD-10-CM | POA: Diagnosis not present

## 2023-04-15 DIAGNOSIS — R278 Other lack of coordination: Secondary | ICD-10-CM | POA: Diagnosis not present

## 2023-04-15 DIAGNOSIS — R4189 Other symptoms and signs involving cognitive functions and awareness: Secondary | ICD-10-CM | POA: Diagnosis not present

## 2023-04-15 DIAGNOSIS — Z741 Need for assistance with personal care: Secondary | ICD-10-CM | POA: Diagnosis not present

## 2023-04-15 DIAGNOSIS — R2681 Unsteadiness on feet: Secondary | ICD-10-CM | POA: Diagnosis not present

## 2023-04-16 NOTE — Telephone Encounter (Signed)
Prolia VOB initiated via [email or web address] 

## 2023-04-17 DIAGNOSIS — R278 Other lack of coordination: Secondary | ICD-10-CM | POA: Diagnosis not present

## 2023-04-17 DIAGNOSIS — J4489 Other specified chronic obstructive pulmonary disease: Secondary | ICD-10-CM | POA: Diagnosis not present

## 2023-04-17 DIAGNOSIS — M6281 Muscle weakness (generalized): Secondary | ICD-10-CM | POA: Diagnosis not present

## 2023-04-17 DIAGNOSIS — G20A1 Parkinson's disease without dyskinesia, without mention of fluctuations: Secondary | ICD-10-CM | POA: Diagnosis not present

## 2023-04-17 DIAGNOSIS — R4189 Other symptoms and signs involving cognitive functions and awareness: Secondary | ICD-10-CM | POA: Diagnosis not present

## 2023-04-17 DIAGNOSIS — R2681 Unsteadiness on feet: Secondary | ICD-10-CM | POA: Diagnosis not present

## 2023-04-17 DIAGNOSIS — I5033 Acute on chronic diastolic (congestive) heart failure: Secondary | ICD-10-CM | POA: Diagnosis not present

## 2023-04-17 DIAGNOSIS — I340 Nonrheumatic mitral (valve) insufficiency: Secondary | ICD-10-CM | POA: Diagnosis not present

## 2023-04-17 DIAGNOSIS — Z741 Need for assistance with personal care: Secondary | ICD-10-CM | POA: Diagnosis not present

## 2023-04-19 ENCOUNTER — Encounter: Payer: Self-pay | Admitting: Cardiovascular Disease

## 2023-04-19 ENCOUNTER — Ambulatory Visit: Payer: Medicare PPO | Attending: Cardiovascular Disease | Admitting: Cardiovascular Disease

## 2023-04-19 VITALS — BP 104/80 | HR 67 | Ht 62.0 in | Wt 113.0 lb

## 2023-04-19 DIAGNOSIS — I10 Essential (primary) hypertension: Secondary | ICD-10-CM

## 2023-04-19 DIAGNOSIS — I059 Rheumatic mitral valve disease, unspecified: Secondary | ICD-10-CM

## 2023-04-19 DIAGNOSIS — I5033 Acute on chronic diastolic (congestive) heart failure: Secondary | ICD-10-CM | POA: Diagnosis not present

## 2023-04-19 DIAGNOSIS — J4489 Other specified chronic obstructive pulmonary disease: Secondary | ICD-10-CM | POA: Diagnosis not present

## 2023-04-19 DIAGNOSIS — I951 Orthostatic hypotension: Secondary | ICD-10-CM | POA: Diagnosis not present

## 2023-04-19 DIAGNOSIS — R4189 Other symptoms and signs involving cognitive functions and awareness: Secondary | ICD-10-CM | POA: Diagnosis not present

## 2023-04-19 DIAGNOSIS — I340 Nonrheumatic mitral (valve) insufficiency: Secondary | ICD-10-CM | POA: Diagnosis not present

## 2023-04-19 DIAGNOSIS — Z741 Need for assistance with personal care: Secondary | ICD-10-CM | POA: Diagnosis not present

## 2023-04-19 DIAGNOSIS — R2681 Unsteadiness on feet: Secondary | ICD-10-CM | POA: Diagnosis not present

## 2023-04-19 DIAGNOSIS — G20A1 Parkinson's disease without dyskinesia, without mention of fluctuations: Secondary | ICD-10-CM | POA: Diagnosis not present

## 2023-04-19 DIAGNOSIS — R278 Other lack of coordination: Secondary | ICD-10-CM | POA: Diagnosis not present

## 2023-04-19 DIAGNOSIS — M6281 Muscle weakness (generalized): Secondary | ICD-10-CM | POA: Diagnosis not present

## 2023-04-19 NOTE — Progress Notes (Signed)
Cardiology Office Note   Date:  04/19/2023   ID:  Crystal, Bass Sep 04, 1941, MRN [id number]  PCP:  Eustaquio Boyden, MD  Cardiologist:   Lorine Bears, MD   Chief Complaint  Patient presents with   Follow-up    4 month f/u no complaints today. Meds reviewed verbally with pt.      History of Present Illness: Crystal Bass is a 82 y.o. adult who is here today for follow-up visit regarding mitral regurgitation status post mitral valve clip.   Crystal Bass has chronic medical conditions that include mitral valve prolapse with regurgitation, exertional dyspnea and orthostatic hypotension in the setting of Parkinson's disease. Crystal Bass was diagnosed with Parkinson's in 2015 . Crystal Bass used to be on multiple blood pressure medications which were discontinued due to orthostatic hypotension.      Crystal Bass underwent mitral valve clip procedure in March of 2023.  However, Crystal Bass had minimal improvement in symptoms since then and repeat echocardiogram showed evidence of severe mitral regurgitation confirmed by TEE.    The patient was hospitalized in September after a fall and left hip fracture requiring surgery.  Echocardiogram while hospitalized showed an EF of 60 to 65% with grade 3 diastolic dysfunction, mild left ventricular hypertrophy, moderate to severe mitral regurgitation and moderate mitral stenosis.  Crystal Bass underwent a third mitral valve clip placement recently and Crystal Bass reports improvement in shortness of breath and dizziness.  Postprocedure echo showed mild to moderate MR.  Crystal Bass reports worsening symptoms of Parkinson's.  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 10/2021 Cath: Nl cors.   Arthritis    Asthma    Breast cancer (HCC) 2002   infiltrative ductal carcinoma    Cataract 2019   Chronic heart failure with preserved ejection fraction (HFpEF) (HCC)    COPD (chronic obstructive pulmonary disease)  (HCC) 07/2007   Ductal carcinoma of breast, estrogen receptor positive, stage 1 (HCC) 09/16/2012   Endometriosis    GERD (gastroesophageal reflux disease)    Hypercalcemia 09/14/2013   Hypertension    Lesion of breast 1992   right, benign   Lung disease    secondary to [name] infection   Mastalgia 05/1994   Mitral regurgitation    a. 10/2021 RHC: PCWP V wave of 34; b. 01/2022 TEE: Severe flail/prolapse of large area of A2 scallop with severe posteriorly directed MR; c. 01/2022 TEER w /Mitraclip XTW x 2; d. 07/2022 Echo: EF 60-65%. No rwma, mild LVH, GrIII DD. Sev dil LA. Mod elev PASP. Mod-Sev MR. Mod MS.   Osteoporosis, post-menopausal 03/14/2012   Ovarian tumor of borderline malignancy, right 2004   Palpitations    a. 02/2022 Zio: Predominantly sinus rhythm at 67 (52-171).  2 runs of NSVT -fastest 9 beats at 167.  35 SVT runs-longest 19 beats, fastest 171x6 beats.   Pancreatitis    secondary to Cholelithiasis   Parkinson disease 02/10/2015   Post-thoracotomy pain syndrome    Pseudomonas pneumonia (HCC) 03/2008   Status post implantation of mitral valve leaflet clip 02/15/2022   s/p XTW MitraClip x2 by Dr. Excell Seltzer in the A2/P2: b. 04/03/23 s/p third mitraclip implantation.   Traumatic closed fracture of distal clavicle with minimal displacement, left, initial encounter 06/2021   [facility]   Uterine fibroid    Vitamin D deficiency     Past Surgical History:  Procedure Laterality  Date   ABDOMINAL HYSTERECTOMY     & BSO for Mucinous borderline tumor of R ovary 2004   APPENDECTOMY  2004   BREAST BIOPSY  08/2004   right breast-benign   BREAST LUMPECTOMY  1992   benign   BUBBLE STUDY  12/27/2021   Procedure: BUBBLE STUDY;  Surgeon: Sande Rives, MD;  Location: [facility] ENDOSCOPY;  Service: Cardiovascular;;   CARDIAC CATHETERIZATION  2007   essentially negation for significant CAD   CHOLECYSTECTOMY  2001   COLONOSCOPY  2014   Dr Marina Goodell , due 2019   COLONOSCOPY  09/2018   2 TA  removed, int hem, no f/u needed (Dr Marina Goodell)    ESOPHAGOGASTRODUODENOSCOPY  09/2018   GERD with esophagitis and stricture dilated, antral gastritis negative for H pylori Marina Goodell)   HIP ARTHROPLASTY Left 08/21/2022   Procedure: ARTHROPLASTY BIPOLAR HIP (HEMIARTHROPLASTY);  Surgeon: Juanell Fairly, MD;  Location: [facility] ORS;  Service: Orthopedics;  Laterality: Left;   LUNG BIOPSY  2002   [name], Dr Edwyna Shell   MASTECTOMY MODIFIED RADICAL Left 2002   oral chemotheraphy (tamoxifen then Armidex) no radiation, Dr.[name]   MITRAL VALVE REPAIR N/A 02/15/2022   Procedure: MITRAL VALVE REPAIR;  Surgeon: Tonny Bollman, MD;  Location: [facility] INVASIVE CV LAB;  Service: Cardiovascular;  Laterality: N/A;   MITRAL VALVE REPAIR N/A 04/03/2023   Procedure: MITRAL VALVE REPAIR;  Surgeon: Tonny Bollman, MD;  Location: [facility] INVASIVE CV LAB;  Service: Cardiovascular;  Laterality: N/A;   RIGHT/LEFT HEART CATH AND CORONARY ANGIOGRAPHY Bilateral 11/14/2021   Procedure: RIGHT/LEFT HEART CATH AND CORONARY ANGIOGRAPHY;  Surgeon: Iran Ouch, MD;  Location: [facility] INVASIVE CV LAB;  Service: Cardiovascular;  Laterality: Bilateral;   TEE WITHOUT CARDIOVERSION N/A 12/29/2018   Procedure: TRANSESOPHAGEAL ECHOCARDIOGRAM (TEE);  Surgeon: Iran Ouch, MD;  Location: [facility] ORS;  Service: Cardiovascular;  Laterality: N/A;   TEE WITHOUT CARDIOVERSION N/A 12/27/2021   Procedure: TRANSESOPHAGEAL ECHOCARDIOGRAM (TEE);  Surgeon: Sande Rives, MD;  Location: [facility] ENDOSCOPY;  Service: Cardiovascular;  Laterality: N/A;   TEE WITHOUT CARDIOVERSION N/A 02/15/2022   Procedure: TRANSESOPHAGEAL ECHOCARDIOGRAM (TEE);  Surgeon: Tonny Bollman, MD;  Location: [facility] INVASIVE CV LAB;  Service: Cardiovascular;  Laterality: N/A;   TEE WITHOUT CARDIOVERSION N/A 05/14/2022   Procedure: TRANSESOPHAGEAL ECHOCARDIOGRAM (TEE);  Surgeon: Sande Rives, MD;  Location: [facility] ENDOSCOPY;  Service: Cardiovascular;  Laterality: N/A;   TEE WITHOUT  CARDIOVERSION N/A 04/03/2023   Procedure: TRANSESOPHAGEAL ECHOCARDIOGRAM;  Surgeon: Tonny Bollman, MD;  Location: [facility] INVASIVE CV LAB;  Service: Cardiovascular;  Laterality: N/A;   TONSILLECTOMY  1946     Current Outpatient Medications  Medication Sig Dispense Refill   acetaminophen (TYLENOL) 500 MG tablet Take 1,000 mg by mouth 3 (three) times daily.     albuterol (VENTOLIN HFA) 108 (90 Base) MCG/ACT inhaler Inhale 2 puffs into the lungs every 6 (six) hours as needed for wheezing or shortness of breath. 18 g 1   aspirin 81 MG tablet Take 1 tablet (81 mg total) by mouth daily. 30 tablet    calcium carbonate (TUMS - DOSED IN MG ELEMENTAL CALCIUM) 500 MG chewable tablet Chew 2 tablets by mouth daily as needed for indigestion or heartburn.     Carbidopa-Levodopa ER (SINEMET CR) 25-100 MG tablet controlled release TAKE TWO TABLET BY MOUTH AT NINE IN THE MORNING, ONE TAB AT ONE IN THE EVENING AND ONE TAB AT FIVE IN THE EVENING 360 tablet 1   Cholecalciferol (VITAMIN D3) 25 MCG (1000 UT) CAPS Take 1  capsule (1,000 Units total) by mouth daily. 30 capsule    clonazePAM (KLONOPIN) 0.5 MG tablet TAKE ONE HALF (1/2) A TABLET BY MOUTH AT BEDTIME 15 tablet 5   diphenhydrAMINE (BENADRYL) 25 MG tablet Take 50 mg by mouth in the morning.     EPINEPHrine 0.3 mg/0.3 mL IJ SOAJ injection Inject 0.3 mg into the muscle as needed for anaphylaxis.     FEROSUL 325 (65 Fe) MG tablet TAKE ONE TABLET BY MOUTH DAILY WITH BREAKFAST 90 tablet 1   Fluticasone-Umeclidin-Vilant (TRELEGY ELLIPTA) 100-62.5-25 MCG/ACT AEPB Inhale 1 puff into the lungs daily. 1 each 6   furosemide (LASIX) 40 MG tablet TAKE ONE TABLET BY MOUTH ONCE DAILY *NEW DOSE* 90 tablet 1   gabapentin ([name]) 300 MG capsule TAKE TWO CAPSULES BY MOUTH THREE TIMES DAILY 540 capsule 1   pantoprazole (PROTONIX) 20 MG tablet Take 1 tablet (20 mg total) by mouth daily.     potassium chloride SA (KLOR-CON M) 20 MEQ tablet TAKE ONE TABLET BY MOUTH ONCE A DAY 60  tablet 1   No current facility-administered medications for this visit.    Allergies:   Sulfa antibiotics, Sulfonamide derivatives, Topamax [topiramate], Clarithromycin, Hydrocodone, Motrin [ibuprofen], Misc. sulfonamide containing compounds, Symbicort [budesonide-formoterol fumarate], Tetracycline hcl, Percocet [oxycodone-acetaminophen], Tape, and Tetracyclines & related    Social History:  The patient  reports that Crystal Bass has never smoked. Crystal Bass has never used smokeless tobacco. Crystal Bass reports that Crystal Bass does not drink alcohol and does not use drugs.   Family History:  The patient's family history includes Breast cancer in her cousin; Breast cancer (age of onset: 92) in her sister; Cancer in her maternal grandfather, maternal grandmother, maternal uncle, maternal uncle, and maternal uncle; Colon cancer in her sister; Colon cancer (age of onset: 30) in her cousin; Emphysema in her mother; Liver cancer in her maternal aunt; Lung cancer (age of onset: 13) in her cousin; Lung cancer (age of onset: 86) in her father; Melanoma in her maternal aunt; Myasthenia gravis in her paternal aunt; Osteoporosis in her paternal aunt; Parkinson's disease in her cousin; Stroke in her paternal aunt.    ROS:  Please see the history of present illness.   Otherwise, review of systems are positive for none.   All other systems are reviewed and negative.    PHYSICAL EXAM: VS:  BP 104/80 (BP Location: Left Arm, Patient Position: Sitting, Cuff Size: Normal)   Pulse 67   Ht 5\' 2"  (1.575 m)   Wt 113 lb (51.3 kg)   SpO2 95%   BMI 20.67 kg/m  , BMI Body mass index is 20.67 kg/m. GEN: Well nourished, well developed, in no acute distress  HEENT: normal  Neck: No JVD, carotid bruits, or masses Cardiac: RRR; no rubs, or gallops .  No murmurs.  Trace bilateral leg edema Respiratory:  clear to auscultation bilaterally, normal work of breathing GI: soft, nontender, nondistended, + BS MS: no deformity or atrophy  Skin: warm and  dry, no rash Neuro:  Strength and sensation are intact Psych: euthymic mood, full affect   EKG:  EKG is ordered today. EKG showed sinus rhythm with sinus arrhythmia, LVH with repolarization abnormalities.   Recent Labs: 04/30/2022: NT-Pro BNP 403 12/07/2022: Magnesium 2.2 04/01/2023: ALT <5; B Natriuretic Peptide 681.9 04/04/2023: BUN 14; Creatinine, Ser 0.69; Hemoglobin 11.4; Platelets 123; Potassium 3.9; Sodium 135    Lipid Panel    Component Value Date/Time   CHOL 171 08/08/2021 1320   TRIG 100.0 08/08/2021 1320  HDL 53.30 08/08/2021 1320   CHOLHDL 3 08/08/2021 1320   VLDL 20.0 08/08/2021 1320   LDLCALC 97 08/08/2021 1320   LDLDIRECT 135.0 08/16/2009 1359      Wt Readings from Last 3 Encounters:  04/19/23 113 lb (51.3 kg)  04/08/23 113 lb 3.2 oz (51.3 kg)  04/03/23 120 lb (54.4 kg)          No data to display            ASSESSMENT AND PLAN:  1.   Mitral valve prolapse : Status post recent third mitral valve clip with improvement in mitral regurgitation to mild to moderate range.  In addition, Crystal Bass seems to be clinically improved and there is no evidence of volume overload at the present time.  Crystal Bass is currently [place] heart association class II.    2.  Orthostatic hypotension: Her symptoms seem to be improved compared to before.  3.  Essential hypertension: Crystal Bass is no longer on amlodipine.  4.  Parkinson's disease: Crystal Bass reports worsening neurologic symptoms.   Disposition: Follow-up with me in 4 months.  Signed,  Lorine Bears, MD  04/19/2023 3:30 PM    [facility]

## 2023-04-19 NOTE — Patient Instructions (Signed)
Medication Instructions:  No changes *If you need a refill on your cardiac medications before your next appointment, please call your pharmacy*   Lab Work: None ordered If you have labs (blood work) drawn today and your tests are completely normal, you will receive your results only by: MyChart Message (if you have MyChart) OR A paper copy in the mail If you have any lab test that is abnormal or we need to change your treatment, we will call you to review the results.   Testing/Procedures: None ordered   Follow-Up: At [facility], you and your health needs are our priority.  As part of our continuing mission to provide you with exceptional heart care, we have created designated Provider Care Teams.  These Care Teams include your primary Cardiologist (physician) and Advanced Practice Providers (APPs -  Physician Assistants and Nurse Practitioners) who all work together to provide you with the care you need, when you need it.  We recommend signing up for the patient portal called "MyChart".  Sign up information is provided on this After Visit Summary.  MyChart is used to connect with patients for Virtual Visits (Telemedicine).  Patients are able to view lab/test results, encounter notes, upcoming appointments, etc.  Non-urgent messages can be sent to your provider as well.   To learn more about what you can do with MyChart, go to [email or web address].    Your next appointment:   4 month(s)  Provider:   You may see Muhammad Arida, MD or one of the following Advanced Practice Providers on your designated Care Team:   Christopher Berge, NP Ryan Dunn, PA-C Cadence Furth, PA-C Sheri Hammock, NP    

## 2023-04-22 DIAGNOSIS — R278 Other lack of coordination: Secondary | ICD-10-CM | POA: Diagnosis not present

## 2023-04-22 DIAGNOSIS — I340 Nonrheumatic mitral (valve) insufficiency: Secondary | ICD-10-CM | POA: Diagnosis not present

## 2023-04-22 DIAGNOSIS — Z741 Need for assistance with personal care: Secondary | ICD-10-CM | POA: Diagnosis not present

## 2023-04-22 DIAGNOSIS — R4189 Other symptoms and signs involving cognitive functions and awareness: Secondary | ICD-10-CM | POA: Diagnosis not present

## 2023-04-22 DIAGNOSIS — G20A1 Parkinson's disease without dyskinesia, without mention of fluctuations: Secondary | ICD-10-CM | POA: Diagnosis not present

## 2023-04-22 DIAGNOSIS — J4489 Other specified chronic obstructive pulmonary disease: Secondary | ICD-10-CM | POA: Diagnosis not present

## 2023-04-22 DIAGNOSIS — I5033 Acute on chronic diastolic (congestive) heart failure: Secondary | ICD-10-CM | POA: Diagnosis not present

## 2023-04-22 DIAGNOSIS — R2681 Unsteadiness on feet: Secondary | ICD-10-CM | POA: Diagnosis not present

## 2023-04-22 DIAGNOSIS — M6281 Muscle weakness (generalized): Secondary | ICD-10-CM | POA: Diagnosis not present

## 2023-04-24 DIAGNOSIS — G20A1 Parkinson's disease without dyskinesia, without mention of fluctuations: Secondary | ICD-10-CM | POA: Diagnosis not present

## 2023-04-24 DIAGNOSIS — R278 Other lack of coordination: Secondary | ICD-10-CM | POA: Diagnosis not present

## 2023-04-24 DIAGNOSIS — I5033 Acute on chronic diastolic (congestive) heart failure: Secondary | ICD-10-CM | POA: Diagnosis not present

## 2023-04-24 DIAGNOSIS — R2681 Unsteadiness on feet: Secondary | ICD-10-CM | POA: Diagnosis not present

## 2023-04-24 DIAGNOSIS — J4489 Other specified chronic obstructive pulmonary disease: Secondary | ICD-10-CM | POA: Diagnosis not present

## 2023-04-24 DIAGNOSIS — M6281 Muscle weakness (generalized): Secondary | ICD-10-CM | POA: Diagnosis not present

## 2023-04-24 DIAGNOSIS — R4189 Other symptoms and signs involving cognitive functions and awareness: Secondary | ICD-10-CM | POA: Diagnosis not present

## 2023-04-24 DIAGNOSIS — Z741 Need for assistance with personal care: Secondary | ICD-10-CM | POA: Diagnosis not present

## 2023-04-24 DIAGNOSIS — I340 Nonrheumatic mitral (valve) insufficiency: Secondary | ICD-10-CM | POA: Diagnosis not present

## 2023-04-26 ENCOUNTER — Other Ambulatory Visit (HOSPITAL_COMMUNITY): Payer: Self-pay

## 2023-04-26 DIAGNOSIS — R2681 Unsteadiness on feet: Secondary | ICD-10-CM | POA: Diagnosis not present

## 2023-04-26 DIAGNOSIS — M6281 Muscle weakness (generalized): Secondary | ICD-10-CM | POA: Diagnosis not present

## 2023-04-26 DIAGNOSIS — R4189 Other symptoms and signs involving cognitive functions and awareness: Secondary | ICD-10-CM | POA: Diagnosis not present

## 2023-04-26 DIAGNOSIS — I340 Nonrheumatic mitral (valve) insufficiency: Secondary | ICD-10-CM | POA: Diagnosis not present

## 2023-04-26 DIAGNOSIS — G20A1 Parkinson's disease without dyskinesia, without mention of fluctuations: Secondary | ICD-10-CM | POA: Diagnosis not present

## 2023-04-26 DIAGNOSIS — Z741 Need for assistance with personal care: Secondary | ICD-10-CM | POA: Diagnosis not present

## 2023-04-26 DIAGNOSIS — I5033 Acute on chronic diastolic (congestive) heart failure: Secondary | ICD-10-CM | POA: Diagnosis not present

## 2023-04-26 DIAGNOSIS — J4489 Other specified chronic obstructive pulmonary disease: Secondary | ICD-10-CM | POA: Diagnosis not present

## 2023-04-26 DIAGNOSIS — R278 Other lack of coordination: Secondary | ICD-10-CM | POA: Diagnosis not present

## 2023-04-26 NOTE — Telephone Encounter (Signed)
Placed a call to [facility] at [phone] to verify benefits for Prolia.   Per representative, patient has a $35 copay, no deductible and a prior authorization is required.   Per test claim, copay is $50 with [facility].

## 2023-04-26 NOTE — Telephone Encounter (Signed)
Patient Advocate Encounter  Prior Authorization for Prolia 60mg  has been approved with [employer].    PA# [id number] Effective dates: 05/06/20 through 11/26/23  Per [facility] test claim, copay for 180 days supply is $50

## 2023-04-26 NOTE — Telephone Encounter (Signed)
Pharmacy Patient Advocate Encounter   Received notification from [facility] that prior authorization for Prolia is required/requested.   PA submitted to [facility] via CoverMyMeds Key or (Medicaid) confirmation # [id number] Status is pending

## 2023-04-29 DIAGNOSIS — I340 Nonrheumatic mitral (valve) insufficiency: Secondary | ICD-10-CM | POA: Diagnosis not present

## 2023-04-29 DIAGNOSIS — Z741 Need for assistance with personal care: Secondary | ICD-10-CM | POA: Diagnosis not present

## 2023-04-29 DIAGNOSIS — I5033 Acute on chronic diastolic (congestive) heart failure: Secondary | ICD-10-CM | POA: Diagnosis not present

## 2023-04-29 DIAGNOSIS — G20A1 Parkinson's disease without dyskinesia, without mention of fluctuations: Secondary | ICD-10-CM | POA: Diagnosis not present

## 2023-04-29 DIAGNOSIS — J4489 Other specified chronic obstructive pulmonary disease: Secondary | ICD-10-CM | POA: Diagnosis not present

## 2023-04-29 DIAGNOSIS — R278 Other lack of coordination: Secondary | ICD-10-CM | POA: Diagnosis not present

## 2023-04-29 DIAGNOSIS — M6281 Muscle weakness (generalized): Secondary | ICD-10-CM | POA: Diagnosis not present

## 2023-04-29 DIAGNOSIS — R4189 Other symptoms and signs involving cognitive functions and awareness: Secondary | ICD-10-CM | POA: Diagnosis not present

## 2023-04-29 DIAGNOSIS — R2681 Unsteadiness on feet: Secondary | ICD-10-CM | POA: Diagnosis not present

## 2023-04-29 NOTE — Telephone Encounter (Signed)
Pt ready for scheduling for Prolia on or after : 04/29/23  Out-of-pocket cost due at time of visit: $35  Primary: [employer] - Medicare Prolia co-insurance: 0% Admin fee co-insurance: $35  Secondary: N/A Prolia co-insurance:  Admin fee co-insurance:   Medical Benefit Details: Date Benefits were checked: 04/26/23 Deductible: no/ Coinsurance: 0%/ Admin Fee: $35  Prior Auth: approved PA# [id number] Expiration Date: 11/26/23   Pharmacy benefit: Copay $50 If patient wants fill through the pharmacy benefit please send prescription to:  [facility] , and include estimated need by date in rx notes. Pharmacy will ship medication directly to the office.  Patient NOT eligible for Prolia Copay Card. Copay Card can make patient's cost as little as $25. Link to apply: [email or web address]://[email or web address]  ** This summary of benefits is an estimation of the patient's out-of-pocket cost. Exact cost may very based on individual plan coverage.

## 2023-05-01 ENCOUNTER — Ambulatory Visit: Payer: Self-pay

## 2023-05-01 DIAGNOSIS — Z741 Need for assistance with personal care: Secondary | ICD-10-CM | POA: Diagnosis not present

## 2023-05-01 DIAGNOSIS — I5033 Acute on chronic diastolic (congestive) heart failure: Secondary | ICD-10-CM | POA: Diagnosis not present

## 2023-05-01 DIAGNOSIS — J4489 Other specified chronic obstructive pulmonary disease: Secondary | ICD-10-CM | POA: Diagnosis not present

## 2023-05-01 DIAGNOSIS — M6281 Muscle weakness (generalized): Secondary | ICD-10-CM | POA: Diagnosis not present

## 2023-05-01 DIAGNOSIS — R2681 Unsteadiness on feet: Secondary | ICD-10-CM | POA: Diagnosis not present

## 2023-05-01 DIAGNOSIS — R4189 Other symptoms and signs involving cognitive functions and awareness: Secondary | ICD-10-CM | POA: Diagnosis not present

## 2023-05-01 DIAGNOSIS — I340 Nonrheumatic mitral (valve) insufficiency: Secondary | ICD-10-CM | POA: Diagnosis not present

## 2023-05-01 DIAGNOSIS — R278 Other lack of coordination: Secondary | ICD-10-CM | POA: Diagnosis not present

## 2023-05-01 DIAGNOSIS — G20A1 Parkinson's disease without dyskinesia, without mention of fluctuations: Secondary | ICD-10-CM | POA: Diagnosis not present

## 2023-05-01 NOTE — Patient Outreach (Signed)
  Care Coordination   05/01/2023 Name: DARLA BLIGH MRN: [id number] DOB: 1941-01-14   Care Coordination Outreach Attempts:  An unsuccessful telephone outreach was attempted for a scheduled appointment today. HIPAA compliant message left with call back phone number.   Follow Up Plan:  Additional outreach attempts will be made to offer the patient care coordination information and services.   Encounter Outcome:  No Answer   Care Coordination Interventions:  No, not indicated    George Ina [facility] Care Coordination [phone] direct line

## 2023-05-03 DIAGNOSIS — R2681 Unsteadiness on feet: Secondary | ICD-10-CM | POA: Diagnosis not present

## 2023-05-03 DIAGNOSIS — G20A1 Parkinson's disease without dyskinesia, without mention of fluctuations: Secondary | ICD-10-CM | POA: Diagnosis not present

## 2023-05-03 DIAGNOSIS — I340 Nonrheumatic mitral (valve) insufficiency: Secondary | ICD-10-CM | POA: Diagnosis not present

## 2023-05-03 DIAGNOSIS — Z741 Need for assistance with personal care: Secondary | ICD-10-CM | POA: Diagnosis not present

## 2023-05-03 DIAGNOSIS — J4489 Other specified chronic obstructive pulmonary disease: Secondary | ICD-10-CM | POA: Diagnosis not present

## 2023-05-03 DIAGNOSIS — I5033 Acute on chronic diastolic (congestive) heart failure: Secondary | ICD-10-CM | POA: Diagnosis not present

## 2023-05-03 DIAGNOSIS — M6281 Muscle weakness (generalized): Secondary | ICD-10-CM | POA: Diagnosis not present

## 2023-05-03 DIAGNOSIS — R4189 Other symptoms and signs involving cognitive functions and awareness: Secondary | ICD-10-CM | POA: Diagnosis not present

## 2023-05-03 DIAGNOSIS — R278 Other lack of coordination: Secondary | ICD-10-CM | POA: Diagnosis not present

## 2023-05-03 NOTE — Telephone Encounter (Signed)
Left message to return call to our office.  Will need labs checked due after 05/02/23

## 2023-05-06 DIAGNOSIS — M6281 Muscle weakness (generalized): Secondary | ICD-10-CM | POA: Diagnosis not present

## 2023-05-06 DIAGNOSIS — I340 Nonrheumatic mitral (valve) insufficiency: Secondary | ICD-10-CM | POA: Diagnosis not present

## 2023-05-06 DIAGNOSIS — I5033 Acute on chronic diastolic (congestive) heart failure: Secondary | ICD-10-CM | POA: Diagnosis not present

## 2023-05-06 DIAGNOSIS — R2681 Unsteadiness on feet: Secondary | ICD-10-CM | POA: Diagnosis not present

## 2023-05-06 DIAGNOSIS — R4189 Other symptoms and signs involving cognitive functions and awareness: Secondary | ICD-10-CM | POA: Diagnosis not present

## 2023-05-06 DIAGNOSIS — R278 Other lack of coordination: Secondary | ICD-10-CM | POA: Diagnosis not present

## 2023-05-06 DIAGNOSIS — Z741 Need for assistance with personal care: Secondary | ICD-10-CM | POA: Diagnosis not present

## 2023-05-06 DIAGNOSIS — G20A1 Parkinson's disease without dyskinesia, without mention of fluctuations: Secondary | ICD-10-CM | POA: Diagnosis not present

## 2023-05-06 DIAGNOSIS — J4489 Other specified chronic obstructive pulmonary disease: Secondary | ICD-10-CM | POA: Diagnosis not present

## 2023-05-07 ENCOUNTER — Other Ambulatory Visit: Payer: Self-pay

## 2023-05-07 DIAGNOSIS — M810 Age-related osteoporosis without current pathological fracture: Secondary | ICD-10-CM

## 2023-05-07 NOTE — Telephone Encounter (Signed)
Called patient reviewed all following information including appointment, Co pay due at time of visit and if pick up of injection is needed from outside pharmacy.     Out of pocket for patient:  $35  Lab appointment : 05/14/23  Nurse visit:  05/21/23  Lab order placed: Yes  Prolia has been  [x]   Ordered  []   Script sent to local pharmacy for patient to bring   []   Script sent to Specialty pharmacy   []   Script sent to [facility] to deliver

## 2023-05-08 DIAGNOSIS — R2681 Unsteadiness on feet: Secondary | ICD-10-CM | POA: Diagnosis not present

## 2023-05-08 DIAGNOSIS — J4489 Other specified chronic obstructive pulmonary disease: Secondary | ICD-10-CM | POA: Diagnosis not present

## 2023-05-08 DIAGNOSIS — M6281 Muscle weakness (generalized): Secondary | ICD-10-CM | POA: Diagnosis not present

## 2023-05-08 DIAGNOSIS — R4189 Other symptoms and signs involving cognitive functions and awareness: Secondary | ICD-10-CM | POA: Diagnosis not present

## 2023-05-08 DIAGNOSIS — G20A1 Parkinson's disease without dyskinesia, without mention of fluctuations: Secondary | ICD-10-CM | POA: Diagnosis not present

## 2023-05-08 DIAGNOSIS — Z741 Need for assistance with personal care: Secondary | ICD-10-CM | POA: Diagnosis not present

## 2023-05-08 DIAGNOSIS — I340 Nonrheumatic mitral (valve) insufficiency: Secondary | ICD-10-CM | POA: Diagnosis not present

## 2023-05-08 DIAGNOSIS — R278 Other lack of coordination: Secondary | ICD-10-CM | POA: Diagnosis not present

## 2023-05-08 DIAGNOSIS — I5033 Acute on chronic diastolic (congestive) heart failure: Secondary | ICD-10-CM | POA: Diagnosis not present

## 2023-05-09 ENCOUNTER — Other Ambulatory Visit: Payer: Self-pay

## 2023-05-09 DIAGNOSIS — G20A1 Parkinson's disease without dyskinesia, without mention of fluctuations: Secondary | ICD-10-CM

## 2023-05-09 MED ORDER — CLONAZEPAM 0.5 MG PO TABS: ORAL_TABLET | ORAL | 0 refills | Status: DC

## 2023-05-10 DIAGNOSIS — Z741 Need for assistance with personal care: Secondary | ICD-10-CM | POA: Diagnosis not present

## 2023-05-10 DIAGNOSIS — R4189 Other symptoms and signs involving cognitive functions and awareness: Secondary | ICD-10-CM | POA: Diagnosis not present

## 2023-05-10 DIAGNOSIS — R278 Other lack of coordination: Secondary | ICD-10-CM | POA: Diagnosis not present

## 2023-05-10 DIAGNOSIS — M6281 Muscle weakness (generalized): Secondary | ICD-10-CM | POA: Diagnosis not present

## 2023-05-10 DIAGNOSIS — G20A1 Parkinson's disease without dyskinesia, without mention of fluctuations: Secondary | ICD-10-CM | POA: Diagnosis not present

## 2023-05-10 DIAGNOSIS — I5033 Acute on chronic diastolic (congestive) heart failure: Secondary | ICD-10-CM | POA: Diagnosis not present

## 2023-05-10 DIAGNOSIS — R2681 Unsteadiness on feet: Secondary | ICD-10-CM | POA: Diagnosis not present

## 2023-05-10 DIAGNOSIS — J4489 Other specified chronic obstructive pulmonary disease: Secondary | ICD-10-CM | POA: Diagnosis not present

## 2023-05-10 DIAGNOSIS — I340 Nonrheumatic mitral (valve) insufficiency: Secondary | ICD-10-CM | POA: Diagnosis not present

## 2023-05-13 DIAGNOSIS — I340 Nonrheumatic mitral (valve) insufficiency: Secondary | ICD-10-CM | POA: Diagnosis not present

## 2023-05-13 DIAGNOSIS — J4489 Other specified chronic obstructive pulmonary disease: Secondary | ICD-10-CM | POA: Diagnosis not present

## 2023-05-13 DIAGNOSIS — R278 Other lack of coordination: Secondary | ICD-10-CM | POA: Diagnosis not present

## 2023-05-13 DIAGNOSIS — R4189 Other symptoms and signs involving cognitive functions and awareness: Secondary | ICD-10-CM | POA: Diagnosis not present

## 2023-05-13 DIAGNOSIS — I5033 Acute on chronic diastolic (congestive) heart failure: Secondary | ICD-10-CM | POA: Diagnosis not present

## 2023-05-13 DIAGNOSIS — M6281 Muscle weakness (generalized): Secondary | ICD-10-CM | POA: Diagnosis not present

## 2023-05-13 DIAGNOSIS — R2681 Unsteadiness on feet: Secondary | ICD-10-CM | POA: Diagnosis not present

## 2023-05-13 DIAGNOSIS — Z741 Need for assistance with personal care: Secondary | ICD-10-CM | POA: Diagnosis not present

## 2023-05-13 DIAGNOSIS — G20A1 Parkinson's disease without dyskinesia, without mention of fluctuations: Secondary | ICD-10-CM | POA: Diagnosis not present

## 2023-05-14 ENCOUNTER — Other Ambulatory Visit (INDEPENDENT_AMBULATORY_CARE_PROVIDER_SITE_OTHER): Payer: Medicare PPO

## 2023-05-14 DIAGNOSIS — M810 Age-related osteoporosis without current pathological fracture: Secondary | ICD-10-CM | POA: Diagnosis not present

## 2023-05-14 NOTE — Progress Notes (Unsigned)
[facility]   MULTIDISCIPLINARY HEART VALVE CLINIC                                     Cardiology Office Note:    Date:  05/16/2023   ID:  Crystal Bass, DOB 1941-08-16, MRN [id number]  PCP:  Meriam Sprague, MD  [facility] Cardiologist:  Lorine Bears, MD/ Dr. Excell Seltzer, MD (TEER)  [facility] Electrophysiologist:  None   Referring MD: Eustaquio Boyden, MD   Chief Complaint  Patient presents with   Follow-up    1 month s/p TEER   History of Present Illness:    Crystal Bass is a 82 y.o. adult with a hx of pulmonary embolus 2019, Parkinson's disease complicated by orthostatic hypotension, [name] infection with chronic lung disease, remote mastectomy for breast cancer, and severe mitral regurgitation s/p mTEER 02/15/22 found to have residual moderate to severe MR and is now s/p repeat TEER 04/03/23. She is being see today for one month follow up.    Crystal Bass was hospitalized in 10/2018 with atypical chest pain. Initial CT of the chest showed single pulmonary embolism in the apical segment branch of the left upper lobe. There was also evidence of bronchiectasis. Repeat CT after 2 days showed resolution of the PE. She was treated with Eliquis for 3 months. She had worsening exertional dyspnea in 2022 and underwent echo in 08/2021 which showed normal LV systolic function, G2DD, small pericardial effusion, and moderate to severe mitral regurgitation due to significant prolapse of the anterior mitral leaflet. She subsequently underwent R/LHC in 01/2022 which showed normal coronary arteries, normal LV systolic function, and moderate to severe MR with giant V waves (34 mmHg). She underwent MitraClip placement x2 in March 2023 though had minimal improvement in symptoms with subsequent echo showing evidence of severe mitral regurgitation which was confirmed by TEE. She was scheduled for placement of a third MitraClip in 05/2022 however, patient canceled given risks of mitral  stenosis.    She was then admitted 07/2022 following a fall with closed left hip fracture requiring left hip hemiarthroplasty. She was seen by our team during admission. Follow-up echo showed an EF of 60 to 65% with grade 3 diastolic dysfunction, mild LVH, and moderate to severe MR and moderate mitral stenosis.    In follow up with Dr. Excell Seltzer on 03/14/23 and complained of continued exertional shortness of breath and fatigue and wanted to proceed with a third clip placement. She was subsequently seen by Dr. Leafy Ro for consideration of mini mitral valve replacement. Her anatomy was felt to be prohibitive for mini thoracotomy and she had an elevated risk for surgical MVR given age, parkinsons disease and frailty. Plans were made for second mTEER attempt, which was set up for 04/03/23.   She underwent successful MitraClip implant with G4 XTW device, positioned A2/P2, lateral to the indwelling prior Clip devices. Reducing her MR from 4+ to 2+ post-procedure. Post op echo showed EF 60%, mild to moderate residual MR with a mean gradient of 4 mmHg. She was continued on ASA 81mg . In follow up she was doing very well and could tell an improvement in her symptoms.   She is here alone today and continues to do quite well although her energy has been low recently. She did have an episode of chest pressure that extended into her throat several days ago. [facility] staff reported "irregular"  HR. She wore a ZIO last year that showed no AF however several SVT runs and PVCs with a 1.7% burden. She was offered a beta blocker but deferred at that time. She had no other associated symptoms and no recurrence. On cath review, she has no evidence of CAD. Symptoms may represent GERD/GI? Echo today with normal LVEF and moderate MR which appears slightly increased from prior imaging although gradient at with a HR at 56bpm. Noted to have an iatrogenic ASD with L>R shunting, and mild aortic dilatation measuring 41mm. Otherwise, she  denies recurrent chest pain, SOB, LE edema, orthopnea, dizziness, or syncope. Denies bleeding in stool or urine.     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 10/2021 Cath: Nl cors.   Arthritis    Asthma    Breast cancer (HCC) 2002   infiltrative ductal carcinoma    Cataract 2019   Chronic heart failure with preserved ejection fraction (HFpEF) (HCC)    COPD (chronic obstructive pulmonary disease) (HCC) 07/2007   Ductal carcinoma of breast, estrogen receptor positive, stage 1 (HCC) 09/16/2012   Endometriosis    GERD (gastroesophageal reflux disease)    Hypercalcemia 09/14/2013   Hypertension    Lesion of breast 1992   right, benign   Lung disease    secondary to [name] infection   Mastalgia 05/1994   Mitral regurgitation    a. 10/2021 RHC: PCWP V wave of 34; b. 01/2022 TEE: Severe flail/prolapse of large area of A2 scallop with severe posteriorly directed MR; c. 01/2022 TEER w /Mitraclip XTW x 2; d. 07/2022 Echo: EF 60-65%. No rwma, mild LVH, GrIII DD. Sev dil LA. Mod elev PASP. Mod-Sev MR. Mod MS.   Osteoporosis, post-menopausal 03/14/2012   Ovarian tumor of borderline malignancy, right 2004   Palpitations    a. 02/2022 Zio: Predominantly sinus rhythm at 67 (52-171).  2 runs of NSVT -fastest 9 beats at 167.  35 SVT runs-longest 19 beats, fastest 171x6 beats.   Pancreatitis    secondary to Cholelithiasis   Parkinson disease 02/10/2015   Post-thoracotomy pain syndrome    Pseudomonas pneumonia (HCC) 03/2008   Status post implantation of mitral valve leaflet clip 02/15/2022   s/p XTW MitraClip x2 by Dr. Excell Seltzer in the A2/P2: b. 04/03/23 s/p third mitraclip implantation.   Traumatic closed fracture of distal clavicle with minimal displacement, left, initial encounter 06/2021   [facility]   Uterine fibroid    Vitamin D deficiency     Past Surgical History:  Procedure Laterality Date   ABDOMINAL  HYSTERECTOMY     & BSO for Mucinous borderline tumor of R ovary 2004   APPENDECTOMY  2004   BREAST BIOPSY  08/2004   right breast-benign   BREAST LUMPECTOMY  1992   benign   BUBBLE STUDY  12/27/2021   Procedure: BUBBLE STUDY;  Surgeon: Sande Rives, MD;  Location: [facility] ENDOSCOPY;  Service: Cardiovascular;;   CARDIAC CATHETERIZATION  2007   essentially negation for significant CAD   CHOLECYSTECTOMY  2001   COLONOSCOPY  2014   Dr Marina Goodell , due 2019   COLONOSCOPY  09/2018   2 TA removed, int hem, no f/u needed (Dr Marina Goodell)    ESOPHAGOGASTRODUODENOSCOPY  09/2018   GERD with esophagitis and stricture dilated, antral gastritis negative for H pylori Marina Goodell)   HIP ARTHROPLASTY Left 08/21/2022  Procedure: ARTHROPLASTY BIPOLAR HIP (HEMIARTHROPLASTY);  Surgeon: Juanell Fairly, MD;  Location: [facility] ORS;  Service: Orthopedics;  Laterality: Left;   LUNG BIOPSY  2002   [name], Dr Edwyna Shell   MASTECTOMY MODIFIED RADICAL Left 2002   oral chemotheraphy (tamoxifen then Armidex) no radiation, Dr.[name]   MITRAL VALVE REPAIR N/A 02/15/2022   Procedure: MITRAL VALVE REPAIR;  Surgeon: Tonny Bollman, MD;  Location: [facility] INVASIVE CV LAB;  Service: Cardiovascular;  Laterality: N/A;   MITRAL VALVE REPAIR N/A 04/03/2023   Procedure: MITRAL VALVE REPAIR;  Surgeon: Tonny Bollman, MD;  Location: [facility] INVASIVE CV LAB;  Service: Cardiovascular;  Laterality: N/A;   RIGHT/LEFT HEART CATH AND CORONARY ANGIOGRAPHY Bilateral 11/14/2021   Procedure: RIGHT/LEFT HEART CATH AND CORONARY ANGIOGRAPHY;  Surgeon: Iran Ouch, MD;  Location: [facility] INVASIVE CV LAB;  Service: Cardiovascular;  Laterality: Bilateral;   TEE WITHOUT CARDIOVERSION N/A 12/29/2018   Procedure: TRANSESOPHAGEAL ECHOCARDIOGRAM (TEE);  Surgeon: Iran Ouch, MD;  Location: [facility] ORS;  Service: Cardiovascular;  Laterality: N/A;   TEE WITHOUT CARDIOVERSION N/A 12/27/2021   Procedure: TRANSESOPHAGEAL ECHOCARDIOGRAM (TEE);  Surgeon: Sande Rives, MD;   Location: [facility] ENDOSCOPY;  Service: Cardiovascular;  Laterality: N/A;   TEE WITHOUT CARDIOVERSION N/A 02/15/2022   Procedure: TRANSESOPHAGEAL ECHOCARDIOGRAM (TEE);  Surgeon: Tonny Bollman, MD;  Location: [facility] INVASIVE CV LAB;  Service: Cardiovascular;  Laterality: N/A;   TEE WITHOUT CARDIOVERSION N/A 05/14/2022   Procedure: TRANSESOPHAGEAL ECHOCARDIOGRAM (TEE);  Surgeon: Sande Rives, MD;  Location: [facility] ENDOSCOPY;  Service: Cardiovascular;  Laterality: N/A;   TEE WITHOUT CARDIOVERSION N/A 04/03/2023   Procedure: TRANSESOPHAGEAL ECHOCARDIOGRAM;  Surgeon: Tonny Bollman, MD;  [facility] INVASIVE CV LAB;  Service: Cardiovascular;  Laterality: N/A;   TONSILLECTOMY  1946    Current Medications: Current Meds  Medication Sig   acetaminophen (TYLENOL) 500 MG tablet Take 1,000 mg by mouth 3 (three) times daily.   albuterol (VENTOLIN HFA) 108 (90 Base) MCG/ACT inhaler Inhale 2 puffs into the lungs every 6 (six) hours as needed for wheezing or shortness of breath.   aspirin 81 MG tablet Take 1 tablet (81 mg total) by mouth daily.   calcium carbonate (TUMS - DOSED IN MG ELEMENTAL CALCIUM) 500 MG chewable tablet Chew 2 tablets by mouth daily as needed for indigestion or heartburn.   Carbidopa-Levodopa ER (SINEMET CR) 25-100 MG tablet controlled release TAKE TWO TABLET BY MOUTH AT NINE IN THE MORNING, ONE TAB AT ONE IN THE EVENING AND ONE TAB AT FIVE IN THE EVENING   Cholecalciferol (VITAMIN D3) 25 MCG (1000 UT) CAPS Take 1 capsule (1,000 Units total) by mouth daily.   clonazePAM (KLONOPIN) 0.5 MG tablet TAKE ONE HALF (1/2) A TABLET BY MOUTH AT BEDTIME   diphenhydrAMINE (BENADRYL) 25 MG tablet Take 50 mg by mouth in the morning.   EPINEPHrine 0.3 mg/0.3 mL IJ SOAJ injection Inject 0.3 mg into the muscle as needed for anaphylaxis.   FEROSUL 325 (65 Fe) MG tablet TAKE ONE TABLET BY MOUTH DAILY WITH BREAKFAST   Fluticasone-Umeclidin-Vilant (TRELEGY ELLIPTA) 100-62.5-25 MCG/ACT AEPB Inhale 1 puff into the  lungs daily.   furosemide (LASIX) 40 MG tablet TAKE ONE TABLET BY MOUTH ONCE DAILY *NEW DOSE*   gabapentin ([name]) 300 MG capsule TAKE TWO CAPSULES BY MOUTH THREE TIMES DAILY   pantoprazole (PROTONIX) 20 MG tablet Take 1 tablet (20 mg total) by mouth daily.   potassium chloride SA (KLOR-CON M) 20 MEQ tablet TAKE ONE TABLET BY MOUTH ONCE A DAY  Allergies:   Sulfa antibiotics, Sulfonamide derivatives, Topamax [topiramate], Clarithromycin, Hydrocodone, Motrin [ibuprofen], Misc. 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  Tobacco Use   Smoking status: Never   Smokeless tobacco: Never  Vaping Use   Vaping Use: Never used  Substance and Sexual Activity   Alcohol use: No    Alcohol/week: 0.0 standard drinks of alcohol   Drug use: No   Sexual activity: Not Currently    Birth control/protection: Surgical    Comment: TAH/BSO  Other Topics Concern   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    Aunt of Dr Gilmore Laroche    Lives independent living at [facility]    No children    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, 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, 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, 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)    Activity: walking about 1 mile/day, enjoys [employer]    Diet: some water, fruits/vegetables some    Right handed    Social Determinants of Health   Financial Resource Strain: Low Risk  (08/13/2022)   Overall Financial Resource Strain (CARDIA)    Difficulty of Paying Living Expenses: Not hard at all  Food Insecurity: No Food Insecurity (11/28/2022)   Hunger Vital Sign    Worried About Running Out of Food in the Last Year:  Never true    Ran Out of Food in the Last Year: Never true  Transportation Needs: No Transportation Needs (11/28/2022)   PRAPARE - Administrator, Civil Service (Medical): No    Lack of Transportation (Non-Medical): No  Recent Concern: Transportation Needs - Unmet Transportation Needs (11/22/2022)   PRAPARE - Transportation    Lack of Transportation (Medical): No    Lack of Transportation (Non-Medical): Yes  Physical Activity: Insufficiently Active (08/13/2022)   Exercise Vital Sign    Days of Exercise per Week: 4 days    Minutes of Exercise per Session: 30 min  Stress: No Stress Concern Present (08/13/2022)   [employer] of Occupational Health - Occupational Stress Questionnaire    Feeling of Stress : Not at all  Social Connections: Socially Isolated (08/13/2022)   Social Connection and Isolation Panel [NHANES]    Frequency of Communication with Friends and Family: More than three times a week    Frequency of Social Gatherings with Friends and Family: Once a week    Attends Religious Services: Never    Database 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 or Organizations: No    Attends Banker Meetings: Never    Marital Status: Widowed    Family History: The patient's family history includes Breast cancer in her cousin; Breast cancer (age of onset: 28) in her sister; Cancer in her maternal grandfather, maternal grandmother, maternal uncle, maternal uncle, and maternal uncle; Colon cancer in her sister; Colon cancer (age of onset: 44) in her cousin; Emphysema in her mother; Liver cancer in her maternal aunt; Lung cancer (age of onset: 44) in her cousin; Lung cancer (age of onset: 80) in her father; Melanoma in her maternal aunt; Myasthenia gravis in her paternal aunt; Osteoporosis in her paternal aunt; Parkinson's disease in her cousin; Stroke in her paternal aunt. There  is no history of Diabetes, Heart disease, Stomach cancer, or Ulcerative colitis.  ROS:   Please see the history of present  illness.    All other systems reviewed and are negative.  EKGs/Labs/Other Studies Reviewed:    The following studies were reviewed today:  Echocardiogram 05/15/2023:   1. Left ventricular ejection fraction, by estimation, is 60 to 65%. The  left ventricle has normal function. The left ventricle has no regional  wall motion abnormalities. There is mild left ventricular hypertrophy.  Left ventricular diastolic parameters  are indeterminate.   2. Right ventricular systolic function is normal. The right ventricular  size is mildly enlarged. There is mildly elevated pulmonary artery  systolic pressure. The estimated right ventricular systolic pressure is  38.5 mmHg.   3. The mitral valve has been repaired/replaced. There is a Mitra-Clip  present in the mitral position. The average mean gradient is at HR  56bpm. There is moderate mitral regurgitation that appears slightly more  significant compared to prior TTE  best appreciated on SAX views.   4. Left atrial size was severely dilated.   5. An iatrogenic ASD is present with left to right shunting. Evidence of  atrial level shunting detected by color flow Doppler.   6. Right atrial size was moderately dilated.   7. The aortic valve is tricuspid. There is mild calcification of the  aortic valve. There is mild thickening of the aortic valve. Aortic valve  regurgitation is not visualized. Aortic valve sclerosis/calcification is  present, without any evidence of  aortic stenosis.   8. Aortic dilatation noted. There is mild dilatation of the ascending  aorta, measuring 41 mm.   9. The inferior vena cava is normal in size with greater than 50%  respiratory variability, suggesting right atrial pressure of 3 mmHg.   Comparison(s): Compared to prior TTE in 03/2023, the degree of MR appears  slightly more significant on current study (likely within moderate range).  Mean MV gradient is stable.   04/03/23 Conclusion Successful TEER of the  mitral valve with a MitraClip G4 XTW device, positioned A2/P2, lateral to the indwelling Clip devices   MR reduced from 4+ to 2+ post-procedure _____________   Echo 04/04/23:  IMPRESSIONS   1. S/p additonal Mitra-Clip procedure. Mild to moderate mitral  regurgitation. There is systolic blunting of the right sided pulmonary  vein flow. Triple orifice area 2D planimetry of 3.03 cm2. There are two  regurgintant jets; a medial jet and a central  jet between the three Mitra-Clips. There are two iatrogenic ASDs with left  to right flow. The mitral valve has been repaired/replaced. Mild to  moderate mitral valve regurgitation. The mean mitral valve gradient is 4.0  mmHg with average heart rate of 58  bpm. There is a Mitra-Clip present in the mitral position. Procedure Date:  04/03/23.   2. Left ventricular ejection fraction, by estimation, is 60 to 65%. The  left ventricle has normal function. The left ventricle has no regional  wall motion abnormalities. There is mild concentric left ventricular  hypertrophy. Left ventricular diastolic  parameters are indeterminate.   3. Right ventricular systolic function is normal. The right ventricular  size is moderately enlarged. There is moderately elevated pulmonary artery  systolic pressure.   4. Left atrial size was severely dilated.   5. Evidence of atrial level shunting detected by color flow Doppler.   6. Right atrial size was mild to moderately dilated.   7. The aortic valve is tricuspid. Aortic  valve regurgitation is trivial.  No aortic stenosis is present.   8. The inferior vena cava is dilated in size with >50% respiratory  variability, suggesting right atrial pressure of 8 mmHg.   EKG:  EKG is not ordered today.    Recent Labs: 12/07/2022: Magnesium 2.2 04/01/2023: ALT <5; B Natriuretic Peptide 681.9 04/04/2023: Hemoglobin 11.4; Platelets 123 05/14/2023: BUN 17; Creatinine, Ser 0.91; Potassium 4.1; Sodium 138   Recent Lipid Panel     Component Value Date/Time   CHOL 171 08/08/2021 1320   TRIG 100.0 08/08/2021 1320   HDL 53.30 08/08/2021 1320   CHOLHDL 3 08/08/2021 1320   VLDL 20.0 08/08/2021 1320   LDLCALC 97 08/08/2021 1320   LDLDIRECT 135.0 08/16/2009 1359   Physical Exam:    VS:  BP (!) 140/88   Pulse 62   Ht 5\' 2"  (1.575 m)   Wt 113 lb (51.3 kg)   SpO2 98%   BMI 20.67 kg/m     Wt Readings from Last 3 Encounters:  05/15/23 113 lb (51.3 kg)  04/19/23 113 lb (51.3 kg)  04/08/23 113 lb 3.2 oz (51.3 kg)    General: Well developed, well nourished, NAD Lungs:Clear to ausculation bilaterally. No wheezes, rales, or rhonchi. Breathing is unlabored. Cardiovascular: RRR with S1 S2. + murmur Extremities: No edema.  Neuro: Alert and oriented. No focal deficits. No facial asymmetry. MAE spontaneously. Psych: Responds to questions appropriately with normal affect.    ASSESSMENT/PLAN:    Severe MR: Patient doing well with NYHA class II symptoms s/p successful TEER of the mitral valve with a MitraClip G4 XTW device, positioned A2/P2, lateral to the indwelling Clip devices. MR reduced from 4+ to 2+ post-procedure. Post op echo with mild to moderate residual MR with a mean gradient of 4 mmHg. Echo today with slight increase in MR per report however gradient remains stable. Also noted to have iatrogenic ASD with L>R shunting. Continues to participate in PT. Breathing is much better. Continue ASA 81 daily. Dental SBE discussed and patient already has RX'ed coverage. Plan follow up with Dr. Kirke Corin then our team for one year with echocardiogram.   Palpitations: Noted to have intermittent palpitations. Previously wore a ZIO last year that showed several episodes of SVT and PVCs with a 1.7% burden. Offered to add beta blocker however she deferred. Long discussion about repeat ZIO today and the need to r/o AF although I suspect she is having PVCs. She would like to defer for now as she is not overly symptomatic. If she changes her  mind, we will repeat ZIO and plan follow up.   Chest pain: Reports and episode of chest pain last week described as a pressure with lower throat radiation. No CAD on prior cath. No recurrence. May be GI/GERD related as she has had issues with this in the past. She will monitor symptoms for now and reach out if recurrence. She sees her PCP soon as well.    Chronic diastolic CHF: Appears euvolemic today. No change.    Parkinson's disease complicated by orthostatic hypotension: Stable, continue current regime.     [name] infection with chronic lung disease: Follows closely with pulmonary. Continue Trelegy, PRN albuterol.   { Medication Adjustments/Labs and Tests Ordered: Current medicines are reviewed at length with the patient today.  Concerns regarding medicines are outlined above.  No orders of the defined types were placed in this encounter.  No orders of the defined types were placed in this encounter.   Patient Instructions  Medication Instructions:  Your physician recommends that you continue on your current medications as directed. Please refer to the Current Medication list given to you today.  *If you need a refill on your cardiac medications before your next appointment, please call your pharmacy*   Lab Work: NONE If you have labs (blood work) drawn today and your tests are completely normal, you will receive your results only by: MyChart Message (if you have MyChart) OR A paper copy in the mail If you have any lab test that is abnormal or we need to change your treatment, we will call you to review the results.   Testing/Procedures: NONE   Follow-Up: At [facility], you and your health needs are our priority.  As part of our continuing mission to provide you with exceptional heart care, we have created designated Provider Care Teams.  These Care Teams include your primary Cardiologist (physician) and Advanced Practice Providers (APPs -  Physician Assistants and  Nurse Practitioners) who all work together to provide you with the care you need, when you need it.  We recommend signing up for the patient portal called "MyChart".  Sign up information is provided on this After Visit Summary.  MyChart is used to connect with patients for Virtual Visits (Telemedicine).  Patients are able to view lab/test results, encounter notes, upcoming appointments, etc.  Non-urgent messages can be sent to your provider as well.   To learn more about what you can do with MyChart, go to [email or web address].    Your next appointment:   KEEP SCHEDULED FOLLOW-UP   Signed, Georgie Chard, NP  05/16/2023 11:44 AM    [facility]

## 2023-05-14 NOTE — Addendum Note (Signed)
Addended by: Eustaquio Boyden on: 05/14/2023 01:32 PM   Modules accepted: Orders

## 2023-05-15 ENCOUNTER — Ambulatory Visit (INDEPENDENT_AMBULATORY_CARE_PROVIDER_SITE_OTHER): Payer: Medicare PPO | Admitting: Cardiology

## 2023-05-15 ENCOUNTER — Ambulatory Visit: Payer: Medicare PPO | Attending: Cardiology

## 2023-05-15 VITALS — BP 140/88 | HR 62 | Ht 62.0 in | Wt 113.0 lb

## 2023-05-15 DIAGNOSIS — I5032 Chronic diastolic (congestive) heart failure: Secondary | ICD-10-CM | POA: Diagnosis not present

## 2023-05-15 DIAGNOSIS — I340 Nonrheumatic mitral (valve) insufficiency: Secondary | ICD-10-CM

## 2023-05-15 DIAGNOSIS — I059 Rheumatic mitral valve disease, unspecified: Secondary | ICD-10-CM

## 2023-05-15 DIAGNOSIS — R002 Palpitations: Secondary | ICD-10-CM

## 2023-05-15 DIAGNOSIS — R079 Chest pain, unspecified: Secondary | ICD-10-CM

## 2023-05-15 DIAGNOSIS — Z95818 Presence of other cardiac implants and grafts: Secondary | ICD-10-CM

## 2023-05-15 DIAGNOSIS — A310 Pulmonary mycobacterial infection: Secondary | ICD-10-CM

## 2023-05-15 DIAGNOSIS — I10 Essential (primary) hypertension: Secondary | ICD-10-CM | POA: Diagnosis not present

## 2023-05-15 DIAGNOSIS — I77810 Thoracic aortic ectasia: Secondary | ICD-10-CM | POA: Diagnosis not present

## 2023-05-15 DIAGNOSIS — Z9889 Other specified postprocedural states: Secondary | ICD-10-CM

## 2023-05-15 LAB — BASIC METABOLIC PANEL
BUN: 17 mg/dL (ref 6–23)
CO2: 30 mEq/L (ref 19–32)
Calcium: 9.6 mg/dL (ref 8.4–10.5)
Chloride: 98 mEq/L (ref 96–112)
Creatinine, Ser: 0.91 mg/dL (ref 0.40–1.20)
GFR: 59.73 mL/min — ABNORMAL LOW (ref >60.00)
Glucose, Bld: 79 mg/dL (ref 70–99)
Potassium: 4.1 mEq/L (ref 3.5–5.1)
Sodium: 138 mEq/L (ref 135–145)

## 2023-05-15 LAB — ECHOCARDIOGRAM COMPLETE
Area-P 1/2: 1.36 cm2
MV M vel: 4.98 m/s
MV Peak grad: 99.3 mmHg
S' Lateral: 2.9 cm

## 2023-05-15 LAB — VITAMIN D 25 HYDROXY (VIT D DEFICIENCY, FRACTURES): VITD: 30.09 ng/mL (ref 30.00–100.00)

## 2023-05-15 NOTE — Patient Instructions (Signed)
Medication Instructions:  Your physician recommends that you continue on your current medications as directed. Please refer to the Current Medication list given to you today.  *If you need a refill on your cardiac medications before your next appointment, please call your pharmacy*   Lab Work: NONE If you have labs (blood work) drawn today and your tests are completely normal, you will receive your results only by: MyChart Message (if you have MyChart) OR A paper copy in the mail If you have any lab test that is abnormal or we need to change your treatment, we will call you to review the results.   Testing/Procedures: NONE   Follow-Up: At [facility], you and your health needs are our priority.  As part of our continuing mission to provide you with exceptional heart care, we have created designated Provider Care Teams.  These Care Teams include your primary Cardiologist (physician) and Advanced Practice Providers (APPs -  Physician Assistants and Nurse Practitioners) who all work together to provide you with the care you need, when you need it.  We recommend signing up for the patient portal called "MyChart".  Sign up information is provided on this After Visit Summary.  MyChart is used to connect with patients for Virtual Visits (Telemedicine).  Patients are able to view lab/test results, encounter notes, upcoming appointments, etc.  Non-urgent messages can be sent to your provider as well.   To learn more about what you can do with MyChart, go to [email or web address].    Your next appointment:   KEEP SCHEDULED FOLLOW-UP 

## 2023-05-16 ENCOUNTER — Telehealth: Payer: Self-pay | Admitting: Cardiovascular Disease

## 2023-05-16 DIAGNOSIS — Z741 Need for assistance with personal care: Secondary | ICD-10-CM | POA: Diagnosis not present

## 2023-05-16 DIAGNOSIS — G20A1 Parkinson's disease without dyskinesia, without mention of fluctuations: Secondary | ICD-10-CM | POA: Diagnosis not present

## 2023-05-16 DIAGNOSIS — J4489 Other specified chronic obstructive pulmonary disease: Secondary | ICD-10-CM | POA: Diagnosis not present

## 2023-05-16 DIAGNOSIS — R4189 Other symptoms and signs involving cognitive functions and awareness: Secondary | ICD-10-CM | POA: Diagnosis not present

## 2023-05-16 DIAGNOSIS — I340 Nonrheumatic mitral (valve) insufficiency: Secondary | ICD-10-CM | POA: Diagnosis not present

## 2023-05-16 DIAGNOSIS — R278 Other lack of coordination: Secondary | ICD-10-CM | POA: Diagnosis not present

## 2023-05-16 DIAGNOSIS — I5033 Acute on chronic diastolic (congestive) heart failure: Secondary | ICD-10-CM | POA: Diagnosis not present

## 2023-05-16 DIAGNOSIS — M6281 Muscle weakness (generalized): Secondary | ICD-10-CM | POA: Diagnosis not present

## 2023-05-16 DIAGNOSIS — R2681 Unsteadiness on feet: Secondary | ICD-10-CM | POA: Diagnosis not present

## 2023-05-16 NOTE — Telephone Encounter (Signed)
Spoke with patient and informed her the echocardiogram results as follows:  Filbert Schilder, NP 05/16/2023 12:17 PM EDT   "Please let the patient know that her echocardiogram looks good! MR remains in the moderate range and her gradients and clips are stable. If she changes her mind about wearing the ZIO then please tell her to reach out and we can take care of that, otherwise she will see Dr. Kirke Corin then myself at her one year follow up as we discussed."

## 2023-05-16 NOTE — Telephone Encounter (Signed)
Patient states she is returning call in regards to results. Requesting return call.

## 2023-05-17 DIAGNOSIS — I340 Nonrheumatic mitral (valve) insufficiency: Secondary | ICD-10-CM | POA: Diagnosis not present

## 2023-05-17 DIAGNOSIS — J4489 Other specified chronic obstructive pulmonary disease: Secondary | ICD-10-CM | POA: Diagnosis not present

## 2023-05-17 DIAGNOSIS — I5033 Acute on chronic diastolic (congestive) heart failure: Secondary | ICD-10-CM | POA: Diagnosis not present

## 2023-05-17 DIAGNOSIS — Z741 Need for assistance with personal care: Secondary | ICD-10-CM | POA: Diagnosis not present

## 2023-05-17 DIAGNOSIS — R278 Other lack of coordination: Secondary | ICD-10-CM | POA: Diagnosis not present

## 2023-05-17 DIAGNOSIS — G20A1 Parkinson's disease without dyskinesia, without mention of fluctuations: Secondary | ICD-10-CM | POA: Diagnosis not present

## 2023-05-17 DIAGNOSIS — R2681 Unsteadiness on feet: Secondary | ICD-10-CM | POA: Diagnosis not present

## 2023-05-17 DIAGNOSIS — R4189 Other symptoms and signs involving cognitive functions and awareness: Secondary | ICD-10-CM | POA: Diagnosis not present

## 2023-05-17 DIAGNOSIS — M6281 Muscle weakness (generalized): Secondary | ICD-10-CM | POA: Diagnosis not present

## 2023-05-20 ENCOUNTER — Telehealth: Payer: Self-pay

## 2023-05-20 DIAGNOSIS — R2681 Unsteadiness on feet: Secondary | ICD-10-CM | POA: Diagnosis not present

## 2023-05-20 DIAGNOSIS — Z741 Need for assistance with personal care: Secondary | ICD-10-CM | POA: Diagnosis not present

## 2023-05-20 DIAGNOSIS — I340 Nonrheumatic mitral (valve) insufficiency: Secondary | ICD-10-CM | POA: Diagnosis not present

## 2023-05-20 DIAGNOSIS — I5033 Acute on chronic diastolic (congestive) heart failure: Secondary | ICD-10-CM | POA: Diagnosis not present

## 2023-05-20 DIAGNOSIS — J4489 Other specified chronic obstructive pulmonary disease: Secondary | ICD-10-CM | POA: Diagnosis not present

## 2023-05-20 DIAGNOSIS — R4189 Other symptoms and signs involving cognitive functions and awareness: Secondary | ICD-10-CM | POA: Diagnosis not present

## 2023-05-20 DIAGNOSIS — R278 Other lack of coordination: Secondary | ICD-10-CM | POA: Diagnosis not present

## 2023-05-20 DIAGNOSIS — G20A1 Parkinson's disease without dyskinesia, without mention of fluctuations: Secondary | ICD-10-CM | POA: Diagnosis not present

## 2023-05-20 DIAGNOSIS — M6281 Muscle weakness (generalized): Secondary | ICD-10-CM | POA: Diagnosis not present

## 2023-05-20 NOTE — Telephone Encounter (Signed)
Yes she's at low risk of cataract surgery and OK to proceed from my standpoint. thx

## 2023-05-20 NOTE — Telephone Encounter (Signed)
Called pt to review recent ECHO results and she would like to know if she's clear with Dr Cooper/structural team to proceed with her cataract surgery and get the anesthesia that they want to give her. Not asking for official cardiac clearance, just wants to know the recommendation on if/how long she should wait until attempting to scheduled that procedure. Routing to MD & Georgie Chard for input.

## 2023-05-21 ENCOUNTER — Ambulatory Visit: Payer: Medicare PPO

## 2023-05-21 DIAGNOSIS — M810 Age-related osteoporosis without current pathological fracture: Secondary | ICD-10-CM | POA: Diagnosis not present

## 2023-05-21 MED ORDER — DENOSUMAB 60 MG/ML ~~LOC~~ SOSY
60.0000 mg | PREFILLED_SYRINGE | Freq: Once | SUBCUTANEOUS | Status: AC
Start: 2023-05-21 — End: 2023-05-21
  Administered 2023-05-21: 60 mg via SUBCUTANEOUS

## 2023-05-21 NOTE — Telephone Encounter (Signed)
Crystal Bollman, MD  You17 hours ago (5:41 PM)   Yes she's at low risk of cataract surgery and OK to proceed from my standpoint. thx     Left detailed message per DPR informing patient and explained that if they want cardiac clearance, that we will gladly take care of it.

## 2023-05-21 NOTE — Progress Notes (Signed)
Per orders of Dr. Eustaquio Boyden, injection of prolia 60 mg given by Lewanda Rife in right deltoid. Patient tolerated injection well. Patient will make appointment for 6 month   Pt said recent mitral valve clip, hip replacement 2 months aqo and upcoming cataract surgery.pt wants to know if OK to take prolia injection today. No kidney issues,no upcoming dental work scheduled and pt feels OK, no known infection and T 97.9 orally. Dr Reece Agar said yes can give prolia today.

## 2023-05-22 DIAGNOSIS — Z741 Need for assistance with personal care: Secondary | ICD-10-CM | POA: Diagnosis not present

## 2023-05-22 DIAGNOSIS — G20A1 Parkinson's disease without dyskinesia, without mention of fluctuations: Secondary | ICD-10-CM | POA: Diagnosis not present

## 2023-05-22 DIAGNOSIS — J4489 Other specified chronic obstructive pulmonary disease: Secondary | ICD-10-CM | POA: Diagnosis not present

## 2023-05-22 DIAGNOSIS — I340 Nonrheumatic mitral (valve) insufficiency: Secondary | ICD-10-CM | POA: Diagnosis not present

## 2023-05-22 DIAGNOSIS — I5033 Acute on chronic diastolic (congestive) heart failure: Secondary | ICD-10-CM | POA: Diagnosis not present

## 2023-05-22 DIAGNOSIS — R278 Other lack of coordination: Secondary | ICD-10-CM | POA: Diagnosis not present

## 2023-05-22 DIAGNOSIS — M6281 Muscle weakness (generalized): Secondary | ICD-10-CM | POA: Diagnosis not present

## 2023-05-22 DIAGNOSIS — R2681 Unsteadiness on feet: Secondary | ICD-10-CM | POA: Diagnosis not present

## 2023-05-22 DIAGNOSIS — R4189 Other symptoms and signs involving cognitive functions and awareness: Secondary | ICD-10-CM | POA: Diagnosis not present

## 2023-05-23 DIAGNOSIS — G20A1 Parkinson's disease without dyskinesia, without mention of fluctuations: Secondary | ICD-10-CM | POA: Diagnosis not present

## 2023-05-23 DIAGNOSIS — I340 Nonrheumatic mitral (valve) insufficiency: Secondary | ICD-10-CM | POA: Diagnosis not present

## 2023-05-23 DIAGNOSIS — R4189 Other symptoms and signs involving cognitive functions and awareness: Secondary | ICD-10-CM | POA: Diagnosis not present

## 2023-05-23 DIAGNOSIS — M6281 Muscle weakness (generalized): Secondary | ICD-10-CM | POA: Diagnosis not present

## 2023-05-23 DIAGNOSIS — R278 Other lack of coordination: Secondary | ICD-10-CM | POA: Diagnosis not present

## 2023-05-23 DIAGNOSIS — Z741 Need for assistance with personal care: Secondary | ICD-10-CM | POA: Diagnosis not present

## 2023-05-23 DIAGNOSIS — J4489 Other specified chronic obstructive pulmonary disease: Secondary | ICD-10-CM | POA: Diagnosis not present

## 2023-05-23 DIAGNOSIS — I5033 Acute on chronic diastolic (congestive) heart failure: Secondary | ICD-10-CM | POA: Diagnosis not present

## 2023-05-23 DIAGNOSIS — R2681 Unsteadiness on feet: Secondary | ICD-10-CM | POA: Diagnosis not present

## 2023-05-27 DIAGNOSIS — R2681 Unsteadiness on feet: Secondary | ICD-10-CM | POA: Diagnosis not present

## 2023-05-27 DIAGNOSIS — I340 Nonrheumatic mitral (valve) insufficiency: Secondary | ICD-10-CM | POA: Diagnosis not present

## 2023-05-27 DIAGNOSIS — J4489 Other specified chronic obstructive pulmonary disease: Secondary | ICD-10-CM | POA: Diagnosis not present

## 2023-05-27 DIAGNOSIS — Z741 Need for assistance with personal care: Secondary | ICD-10-CM | POA: Diagnosis not present

## 2023-05-27 DIAGNOSIS — G20A1 Parkinson's disease without dyskinesia, without mention of fluctuations: Secondary | ICD-10-CM | POA: Diagnosis not present

## 2023-05-27 DIAGNOSIS — R4189 Other symptoms and signs involving cognitive functions and awareness: Secondary | ICD-10-CM | POA: Diagnosis not present

## 2023-05-27 DIAGNOSIS — R278 Other lack of coordination: Secondary | ICD-10-CM | POA: Diagnosis not present

## 2023-05-27 DIAGNOSIS — I5033 Acute on chronic diastolic (congestive) heart failure: Secondary | ICD-10-CM | POA: Diagnosis not present

## 2023-05-27 DIAGNOSIS — M6281 Muscle weakness (generalized): Secondary | ICD-10-CM | POA: Diagnosis not present

## 2023-05-29 DIAGNOSIS — R278 Other lack of coordination: Secondary | ICD-10-CM | POA: Diagnosis not present

## 2023-05-29 DIAGNOSIS — M6281 Muscle weakness (generalized): Secondary | ICD-10-CM | POA: Diagnosis not present

## 2023-05-29 DIAGNOSIS — G20A1 Parkinson's disease without dyskinesia, without mention of fluctuations: Secondary | ICD-10-CM | POA: Diagnosis not present

## 2023-05-29 DIAGNOSIS — I340 Nonrheumatic mitral (valve) insufficiency: Secondary | ICD-10-CM | POA: Diagnosis not present

## 2023-05-29 DIAGNOSIS — R2681 Unsteadiness on feet: Secondary | ICD-10-CM | POA: Diagnosis not present

## 2023-05-29 DIAGNOSIS — Z741 Need for assistance with personal care: Secondary | ICD-10-CM | POA: Diagnosis not present

## 2023-05-29 DIAGNOSIS — I5033 Acute on chronic diastolic (congestive) heart failure: Secondary | ICD-10-CM | POA: Diagnosis not present

## 2023-05-29 DIAGNOSIS — J4489 Other specified chronic obstructive pulmonary disease: Secondary | ICD-10-CM | POA: Diagnosis not present

## 2023-05-29 DIAGNOSIS — R4189 Other symptoms and signs involving cognitive functions and awareness: Secondary | ICD-10-CM | POA: Diagnosis not present

## 2023-05-31 DIAGNOSIS — I340 Nonrheumatic mitral (valve) insufficiency: Secondary | ICD-10-CM | POA: Diagnosis not present

## 2023-05-31 DIAGNOSIS — R4189 Other symptoms and signs involving cognitive functions and awareness: Secondary | ICD-10-CM | POA: Diagnosis not present

## 2023-05-31 DIAGNOSIS — I5033 Acute on chronic diastolic (congestive) heart failure: Secondary | ICD-10-CM | POA: Diagnosis not present

## 2023-05-31 DIAGNOSIS — R278 Other lack of coordination: Secondary | ICD-10-CM | POA: Diagnosis not present

## 2023-05-31 DIAGNOSIS — G20A1 Parkinson's disease without dyskinesia, without mention of fluctuations: Secondary | ICD-10-CM | POA: Diagnosis not present

## 2023-05-31 DIAGNOSIS — R2681 Unsteadiness on feet: Secondary | ICD-10-CM | POA: Diagnosis not present

## 2023-05-31 DIAGNOSIS — M6281 Muscle weakness (generalized): Secondary | ICD-10-CM | POA: Diagnosis not present

## 2023-05-31 DIAGNOSIS — Z741 Need for assistance with personal care: Secondary | ICD-10-CM | POA: Diagnosis not present

## 2023-05-31 DIAGNOSIS — J4489 Other specified chronic obstructive pulmonary disease: Secondary | ICD-10-CM | POA: Diagnosis not present

## 2023-06-03 DIAGNOSIS — J4489 Other specified chronic obstructive pulmonary disease: Secondary | ICD-10-CM | POA: Diagnosis not present

## 2023-06-03 DIAGNOSIS — R2681 Unsteadiness on feet: Secondary | ICD-10-CM | POA: Diagnosis not present

## 2023-06-03 DIAGNOSIS — R4189 Other symptoms and signs involving cognitive functions and awareness: Secondary | ICD-10-CM | POA: Diagnosis not present

## 2023-06-03 DIAGNOSIS — M6281 Muscle weakness (generalized): Secondary | ICD-10-CM | POA: Diagnosis not present

## 2023-06-03 DIAGNOSIS — G20A1 Parkinson's disease without dyskinesia, without mention of fluctuations: Secondary | ICD-10-CM | POA: Diagnosis not present

## 2023-06-03 DIAGNOSIS — I5033 Acute on chronic diastolic (congestive) heart failure: Secondary | ICD-10-CM | POA: Diagnosis not present

## 2023-06-03 DIAGNOSIS — I340 Nonrheumatic mitral (valve) insufficiency: Secondary | ICD-10-CM | POA: Diagnosis not present

## 2023-06-03 DIAGNOSIS — Z741 Need for assistance with personal care: Secondary | ICD-10-CM | POA: Diagnosis not present

## 2023-06-03 DIAGNOSIS — R278 Other lack of coordination: Secondary | ICD-10-CM | POA: Diagnosis not present

## 2023-06-04 DIAGNOSIS — H538 Other visual disturbances: Secondary | ICD-10-CM | POA: Diagnosis not present

## 2023-06-04 DIAGNOSIS — H2513 Age-related nuclear cataract, bilateral: Secondary | ICD-10-CM | POA: Diagnosis not present

## 2023-06-04 DIAGNOSIS — H43813 Vitreous degeneration, bilateral: Secondary | ICD-10-CM | POA: Diagnosis not present

## 2023-06-04 DIAGNOSIS — H04123 Dry eye syndrome of bilateral lacrimal glands: Secondary | ICD-10-CM | POA: Diagnosis not present

## 2023-06-04 DIAGNOSIS — Z0100 Encounter for examination of eyes and vision without abnormal findings: Secondary | ICD-10-CM | POA: Diagnosis not present

## 2023-06-05 DIAGNOSIS — I340 Nonrheumatic mitral (valve) insufficiency: Secondary | ICD-10-CM | POA: Diagnosis not present

## 2023-06-05 DIAGNOSIS — J4489 Other specified chronic obstructive pulmonary disease: Secondary | ICD-10-CM | POA: Diagnosis not present

## 2023-06-05 DIAGNOSIS — G20A1 Parkinson's disease without dyskinesia, without mention of fluctuations: Secondary | ICD-10-CM | POA: Diagnosis not present

## 2023-06-05 DIAGNOSIS — Z741 Need for assistance with personal care: Secondary | ICD-10-CM | POA: Diagnosis not present

## 2023-06-05 DIAGNOSIS — I5033 Acute on chronic diastolic (congestive) heart failure: Secondary | ICD-10-CM | POA: Diagnosis not present

## 2023-06-05 DIAGNOSIS — R278 Other lack of coordination: Secondary | ICD-10-CM | POA: Diagnosis not present

## 2023-06-05 DIAGNOSIS — R2681 Unsteadiness on feet: Secondary | ICD-10-CM | POA: Diagnosis not present

## 2023-06-05 DIAGNOSIS — M6281 Muscle weakness (generalized): Secondary | ICD-10-CM | POA: Diagnosis not present

## 2023-06-05 DIAGNOSIS — R4189 Other symptoms and signs involving cognitive functions and awareness: Secondary | ICD-10-CM | POA: Diagnosis not present

## 2023-06-07 DIAGNOSIS — I340 Nonrheumatic mitral (valve) insufficiency: Secondary | ICD-10-CM | POA: Diagnosis not present

## 2023-06-07 DIAGNOSIS — M6281 Muscle weakness (generalized): Secondary | ICD-10-CM | POA: Diagnosis not present

## 2023-06-07 DIAGNOSIS — I5033 Acute on chronic diastolic (congestive) heart failure: Secondary | ICD-10-CM | POA: Diagnosis not present

## 2023-06-07 DIAGNOSIS — Z741 Need for assistance with personal care: Secondary | ICD-10-CM | POA: Diagnosis not present

## 2023-06-07 DIAGNOSIS — R4189 Other symptoms and signs involving cognitive functions and awareness: Secondary | ICD-10-CM | POA: Diagnosis not present

## 2023-06-07 DIAGNOSIS — R2681 Unsteadiness on feet: Secondary | ICD-10-CM | POA: Diagnosis not present

## 2023-06-07 DIAGNOSIS — G20A1 Parkinson's disease without dyskinesia, without mention of fluctuations: Secondary | ICD-10-CM | POA: Diagnosis not present

## 2023-06-07 DIAGNOSIS — J4489 Other specified chronic obstructive pulmonary disease: Secondary | ICD-10-CM | POA: Diagnosis not present

## 2023-06-07 DIAGNOSIS — R278 Other lack of coordination: Secondary | ICD-10-CM | POA: Diagnosis not present

## 2023-06-10 DIAGNOSIS — R4189 Other symptoms and signs involving cognitive functions and awareness: Secondary | ICD-10-CM | POA: Diagnosis not present

## 2023-06-10 DIAGNOSIS — J4489 Other specified chronic obstructive pulmonary disease: Secondary | ICD-10-CM | POA: Diagnosis not present

## 2023-06-10 DIAGNOSIS — M6281 Muscle weakness (generalized): Secondary | ICD-10-CM | POA: Diagnosis not present

## 2023-06-10 DIAGNOSIS — R278 Other lack of coordination: Secondary | ICD-10-CM | POA: Diagnosis not present

## 2023-06-10 DIAGNOSIS — I5033 Acute on chronic diastolic (congestive) heart failure: Secondary | ICD-10-CM | POA: Diagnosis not present

## 2023-06-10 DIAGNOSIS — I340 Nonrheumatic mitral (valve) insufficiency: Secondary | ICD-10-CM | POA: Diagnosis not present

## 2023-06-10 DIAGNOSIS — R2681 Unsteadiness on feet: Secondary | ICD-10-CM | POA: Diagnosis not present

## 2023-06-10 DIAGNOSIS — G20A1 Parkinson's disease without dyskinesia, without mention of fluctuations: Secondary | ICD-10-CM | POA: Diagnosis not present

## 2023-06-10 DIAGNOSIS — Z741 Need for assistance with personal care: Secondary | ICD-10-CM | POA: Diagnosis not present

## 2023-06-12 DIAGNOSIS — G20A1 Parkinson's disease without dyskinesia, without mention of fluctuations: Secondary | ICD-10-CM | POA: Diagnosis not present

## 2023-06-12 DIAGNOSIS — J4489 Other specified chronic obstructive pulmonary disease: Secondary | ICD-10-CM | POA: Diagnosis not present

## 2023-06-12 DIAGNOSIS — R4189 Other symptoms and signs involving cognitive functions and awareness: Secondary | ICD-10-CM | POA: Diagnosis not present

## 2023-06-12 DIAGNOSIS — R278 Other lack of coordination: Secondary | ICD-10-CM | POA: Diagnosis not present

## 2023-06-12 DIAGNOSIS — R2681 Unsteadiness on feet: Secondary | ICD-10-CM | POA: Diagnosis not present

## 2023-06-12 DIAGNOSIS — Z741 Need for assistance with personal care: Secondary | ICD-10-CM | POA: Diagnosis not present

## 2023-06-12 DIAGNOSIS — I340 Nonrheumatic mitral (valve) insufficiency: Secondary | ICD-10-CM | POA: Diagnosis not present

## 2023-06-12 DIAGNOSIS — M6281 Muscle weakness (generalized): Secondary | ICD-10-CM | POA: Diagnosis not present

## 2023-06-12 DIAGNOSIS — I5033 Acute on chronic diastolic (congestive) heart failure: Secondary | ICD-10-CM | POA: Diagnosis not present

## 2023-06-14 DIAGNOSIS — I5033 Acute on chronic diastolic (congestive) heart failure: Secondary | ICD-10-CM | POA: Diagnosis not present

## 2023-06-14 DIAGNOSIS — R278 Other lack of coordination: Secondary | ICD-10-CM | POA: Diagnosis not present

## 2023-06-14 DIAGNOSIS — G20A1 Parkinson's disease without dyskinesia, without mention of fluctuations: Secondary | ICD-10-CM | POA: Diagnosis not present

## 2023-06-14 DIAGNOSIS — R2681 Unsteadiness on feet: Secondary | ICD-10-CM | POA: Diagnosis not present

## 2023-06-14 DIAGNOSIS — J4489 Other specified chronic obstructive pulmonary disease: Secondary | ICD-10-CM | POA: Diagnosis not present

## 2023-06-14 DIAGNOSIS — Z741 Need for assistance with personal care: Secondary | ICD-10-CM | POA: Diagnosis not present

## 2023-06-14 DIAGNOSIS — R4189 Other symptoms and signs involving cognitive functions and awareness: Secondary | ICD-10-CM | POA: Diagnosis not present

## 2023-06-14 DIAGNOSIS — I340 Nonrheumatic mitral (valve) insufficiency: Secondary | ICD-10-CM | POA: Diagnosis not present

## 2023-06-14 DIAGNOSIS — M6281 Muscle weakness (generalized): Secondary | ICD-10-CM | POA: Diagnosis not present

## 2023-06-17 DIAGNOSIS — J4489 Other specified chronic obstructive pulmonary disease: Secondary | ICD-10-CM | POA: Diagnosis not present

## 2023-06-17 DIAGNOSIS — R278 Other lack of coordination: Secondary | ICD-10-CM | POA: Diagnosis not present

## 2023-06-17 DIAGNOSIS — M6281 Muscle weakness (generalized): Secondary | ICD-10-CM | POA: Diagnosis not present

## 2023-06-17 DIAGNOSIS — G20A1 Parkinson's disease without dyskinesia, without mention of fluctuations: Secondary | ICD-10-CM | POA: Diagnosis not present

## 2023-06-17 DIAGNOSIS — Z741 Need for assistance with personal care: Secondary | ICD-10-CM | POA: Diagnosis not present

## 2023-06-17 DIAGNOSIS — R4189 Other symptoms and signs involving cognitive functions and awareness: Secondary | ICD-10-CM | POA: Diagnosis not present

## 2023-06-17 DIAGNOSIS — I340 Nonrheumatic mitral (valve) insufficiency: Secondary | ICD-10-CM | POA: Diagnosis not present

## 2023-06-17 DIAGNOSIS — I5033 Acute on chronic diastolic (congestive) heart failure: Secondary | ICD-10-CM | POA: Diagnosis not present

## 2023-06-17 DIAGNOSIS — R2681 Unsteadiness on feet: Secondary | ICD-10-CM | POA: Diagnosis not present

## 2023-06-19 DIAGNOSIS — G20A1 Parkinson's disease without dyskinesia, without mention of fluctuations: Secondary | ICD-10-CM | POA: Diagnosis not present

## 2023-06-19 DIAGNOSIS — R278 Other lack of coordination: Secondary | ICD-10-CM | POA: Diagnosis not present

## 2023-06-19 DIAGNOSIS — I340 Nonrheumatic mitral (valve) insufficiency: Secondary | ICD-10-CM | POA: Diagnosis not present

## 2023-06-19 DIAGNOSIS — R2681 Unsteadiness on feet: Secondary | ICD-10-CM | POA: Diagnosis not present

## 2023-06-19 DIAGNOSIS — M6281 Muscle weakness (generalized): Secondary | ICD-10-CM | POA: Diagnosis not present

## 2023-06-19 DIAGNOSIS — R4189 Other symptoms and signs involving cognitive functions and awareness: Secondary | ICD-10-CM | POA: Diagnosis not present

## 2023-06-19 DIAGNOSIS — Z741 Need for assistance with personal care: Secondary | ICD-10-CM | POA: Diagnosis not present

## 2023-06-19 DIAGNOSIS — I5033 Acute on chronic diastolic (congestive) heart failure: Secondary | ICD-10-CM | POA: Diagnosis not present

## 2023-06-19 DIAGNOSIS — J4489 Other specified chronic obstructive pulmonary disease: Secondary | ICD-10-CM | POA: Diagnosis not present

## 2023-06-21 DIAGNOSIS — J4489 Other specified chronic obstructive pulmonary disease: Secondary | ICD-10-CM | POA: Diagnosis not present

## 2023-06-21 DIAGNOSIS — Z741 Need for assistance with personal care: Secondary | ICD-10-CM | POA: Diagnosis not present

## 2023-06-21 DIAGNOSIS — M6281 Muscle weakness (generalized): Secondary | ICD-10-CM | POA: Diagnosis not present

## 2023-06-21 DIAGNOSIS — R278 Other lack of coordination: Secondary | ICD-10-CM | POA: Diagnosis not present

## 2023-06-21 DIAGNOSIS — I340 Nonrheumatic mitral (valve) insufficiency: Secondary | ICD-10-CM | POA: Diagnosis not present

## 2023-06-21 DIAGNOSIS — G20A1 Parkinson's disease without dyskinesia, without mention of fluctuations: Secondary | ICD-10-CM | POA: Diagnosis not present

## 2023-06-21 DIAGNOSIS — R4189 Other symptoms and signs involving cognitive functions and awareness: Secondary | ICD-10-CM | POA: Diagnosis not present

## 2023-06-21 DIAGNOSIS — R2681 Unsteadiness on feet: Secondary | ICD-10-CM | POA: Diagnosis not present

## 2023-06-21 DIAGNOSIS — I5033 Acute on chronic diastolic (congestive) heart failure: Secondary | ICD-10-CM | POA: Diagnosis not present

## 2023-06-24 DIAGNOSIS — G20A1 Parkinson's disease without dyskinesia, without mention of fluctuations: Secondary | ICD-10-CM | POA: Diagnosis not present

## 2023-06-24 DIAGNOSIS — R4189 Other symptoms and signs involving cognitive functions and awareness: Secondary | ICD-10-CM | POA: Diagnosis not present

## 2023-06-24 DIAGNOSIS — R2681 Unsteadiness on feet: Secondary | ICD-10-CM | POA: Diagnosis not present

## 2023-06-24 DIAGNOSIS — J4489 Other specified chronic obstructive pulmonary disease: Secondary | ICD-10-CM | POA: Diagnosis not present

## 2023-06-24 DIAGNOSIS — I340 Nonrheumatic mitral (valve) insufficiency: Secondary | ICD-10-CM | POA: Diagnosis not present

## 2023-06-24 DIAGNOSIS — I5033 Acute on chronic diastolic (congestive) heart failure: Secondary | ICD-10-CM | POA: Diagnosis not present

## 2023-06-24 DIAGNOSIS — M6281 Muscle weakness (generalized): Secondary | ICD-10-CM | POA: Diagnosis not present

## 2023-06-24 DIAGNOSIS — Z741 Need for assistance with personal care: Secondary | ICD-10-CM | POA: Diagnosis not present

## 2023-06-24 DIAGNOSIS — R278 Other lack of coordination: Secondary | ICD-10-CM | POA: Diagnosis not present

## 2023-06-25 DIAGNOSIS — H2512 Age-related nuclear cataract, left eye: Secondary | ICD-10-CM | POA: Diagnosis not present

## 2023-06-26 DIAGNOSIS — M6281 Muscle weakness (generalized): Secondary | ICD-10-CM | POA: Diagnosis not present

## 2023-06-26 DIAGNOSIS — R4189 Other symptoms and signs involving cognitive functions and awareness: Secondary | ICD-10-CM | POA: Diagnosis not present

## 2023-06-26 DIAGNOSIS — G20A1 Parkinson's disease without dyskinesia, without mention of fluctuations: Secondary | ICD-10-CM | POA: Diagnosis not present

## 2023-06-26 DIAGNOSIS — R278 Other lack of coordination: Secondary | ICD-10-CM | POA: Diagnosis not present

## 2023-06-26 DIAGNOSIS — I5033 Acute on chronic diastolic (congestive) heart failure: Secondary | ICD-10-CM | POA: Diagnosis not present

## 2023-06-26 DIAGNOSIS — J4489 Other specified chronic obstructive pulmonary disease: Secondary | ICD-10-CM | POA: Diagnosis not present

## 2023-06-26 DIAGNOSIS — R2681 Unsteadiness on feet: Secondary | ICD-10-CM | POA: Diagnosis not present

## 2023-06-26 DIAGNOSIS — Z741 Need for assistance with personal care: Secondary | ICD-10-CM | POA: Diagnosis not present

## 2023-06-26 DIAGNOSIS — I340 Nonrheumatic mitral (valve) insufficiency: Secondary | ICD-10-CM | POA: Diagnosis not present

## 2023-07-01 DIAGNOSIS — I5033 Acute on chronic diastolic (congestive) heart failure: Secondary | ICD-10-CM | POA: Diagnosis not present

## 2023-07-01 DIAGNOSIS — R278 Other lack of coordination: Secondary | ICD-10-CM | POA: Diagnosis not present

## 2023-07-01 DIAGNOSIS — G20A1 Parkinson's disease without dyskinesia, without mention of fluctuations: Secondary | ICD-10-CM | POA: Diagnosis not present

## 2023-07-01 DIAGNOSIS — I340 Nonrheumatic mitral (valve) insufficiency: Secondary | ICD-10-CM | POA: Diagnosis not present

## 2023-07-01 DIAGNOSIS — J4489 Other specified chronic obstructive pulmonary disease: Secondary | ICD-10-CM | POA: Diagnosis not present

## 2023-07-01 DIAGNOSIS — R4189 Other symptoms and signs involving cognitive functions and awareness: Secondary | ICD-10-CM | POA: Diagnosis not present

## 2023-07-01 DIAGNOSIS — Z741 Need for assistance with personal care: Secondary | ICD-10-CM | POA: Diagnosis not present

## 2023-07-01 DIAGNOSIS — M6281 Muscle weakness (generalized): Secondary | ICD-10-CM | POA: Diagnosis not present

## 2023-07-01 DIAGNOSIS — R2681 Unsteadiness on feet: Secondary | ICD-10-CM | POA: Diagnosis not present

## 2023-07-02 ENCOUNTER — Encounter: Payer: Self-pay | Admitting: Ophthalmology

## 2023-07-02 ENCOUNTER — Telehealth: Payer: Self-pay | Admitting: Cardiovascular Disease

## 2023-07-02 NOTE — Telephone Encounter (Signed)
   Patient Name: Crystal Bass  DOB: 1940/12/06 MRN: [id number]  Primary Cardiologist: Lorine Bears, MD  Chart reviewed as part of pre-operative protocol coverage. Cataract extractions are recognized in guidelines as low risk surgeries that do not typically require specific preoperative testing or holding of blood thinner therapy. Therefore, given past medical history and time since last visit, based on ACC/AHA guidelines, Crystal Bass would be at acceptable risk for the planned procedure without further cardiovascular testing.   I will route this recommendation to the requesting party via Epic fax function and remove from pre-op pool.  Please call with questions.  Carlos Levering, NP 07/02/2023, 4:08 PM

## 2023-07-02 NOTE — Telephone Encounter (Signed)
   Pre-operative Risk Assessment    Patient Name: Crystal Bass  DOB: [birth year] MRN: [id number]      Request for Surgical Clearance    Procedure:   cataract surgery  Date of Surgery:  Clearance 07/10/23                                 Surgeon:  Lockie Mola Surgeon's Group or Practice Name:  [facility]  Phone number:  [phone] Fax number:  [phone]   Type of Clearance Requested:   - Medical    Type of Anesthesia:  Not Indicated   Additional requests/questions:    SignedShawna Orleans   07/02/2023, 3:31 PM

## 2023-07-03 DIAGNOSIS — G20A1 Parkinson's disease without dyskinesia, without mention of fluctuations: Secondary | ICD-10-CM | POA: Diagnosis not present

## 2023-07-03 DIAGNOSIS — R4189 Other symptoms and signs involving cognitive functions and awareness: Secondary | ICD-10-CM | POA: Diagnosis not present

## 2023-07-03 DIAGNOSIS — I340 Nonrheumatic mitral (valve) insufficiency: Secondary | ICD-10-CM | POA: Diagnosis not present

## 2023-07-03 DIAGNOSIS — J4489 Other specified chronic obstructive pulmonary disease: Secondary | ICD-10-CM | POA: Diagnosis not present

## 2023-07-03 DIAGNOSIS — M6281 Muscle weakness (generalized): Secondary | ICD-10-CM | POA: Diagnosis not present

## 2023-07-03 DIAGNOSIS — I5033 Acute on chronic diastolic (congestive) heart failure: Secondary | ICD-10-CM | POA: Diagnosis not present

## 2023-07-03 DIAGNOSIS — R278 Other lack of coordination: Secondary | ICD-10-CM | POA: Diagnosis not present

## 2023-07-03 DIAGNOSIS — Z741 Need for assistance with personal care: Secondary | ICD-10-CM | POA: Diagnosis not present

## 2023-07-03 DIAGNOSIS — R2681 Unsteadiness on feet: Secondary | ICD-10-CM | POA: Diagnosis not present

## 2023-07-08 ENCOUNTER — Encounter: Payer: Self-pay | Admitting: Family Medicine

## 2023-07-08 ENCOUNTER — Telehealth: Payer: Self-pay

## 2023-07-08 ENCOUNTER — Telehealth: Payer: Medicare PPO | Admitting: Family Medicine

## 2023-07-08 VITALS — Temp 97.2°F

## 2023-07-08 DIAGNOSIS — U071 COVID-19: Secondary | ICD-10-CM

## 2023-07-08 MED ORDER — NIRMATRELVIR/RITONAVIR (PAXLOVID)TABLET: 3.0000 | ORAL_TABLET | Freq: Two times a day (BID) | ORAL | Status: DC

## 2023-07-08 MED ORDER — NIRMATRELVIR/RITONAVIR (PAXLOVID)TABLET: 3.0000 | ORAL_TABLET | Freq: Two times a day (BID) | ORAL | 0 refills | Status: AC

## 2023-07-08 NOTE — Assessment & Plan Note (Signed)
Day 3 of symptoms in pt with history of lung disease/ bronchiectasis  Uses trelegy ellipta for that  Overall symptoms are mild  Some runny nose and body aches  Taking tylenol  See AVS for symptom care recommendations  Discussed pros/cons of antiviral treatment and possible side effects  Sent prescription for paxlovid to her pharmacy to start today-will update if any problems or side effects  Call back and Er precautions noted in detail today   Discussed isolation and masking precautions also

## 2023-07-08 NOTE — Telephone Encounter (Signed)
Will see her then 

## 2023-07-08 NOTE — Progress Notes (Signed)
Virtual Visit via Video Note  I connected with Crystal Bass on 07/08/23 at 11:00 AM EDT by a video enabled telemedicine application and verified that I am speaking with the correct person using two identifiers.  Location: Patient: home [facility]) Provider: office    I discussed the limitations of evaluation and management by telemedicine and the availability of in person appointments. The patient expressed understanding and agreed to proceed.  Parties involved in encounter  Patient: Crystal Bass  Provider:  Roxy Manns MD   History of Present Illness: 82 yo pt of Dr Reece Agar presents with covid 19  (past history of parkinsons and CHF and [name] infection/bronchiectasis) Day 3  Vitals:   07/08/23 1042  Temp: (!) 97.2 F (36.2 C)  SpO2: 95%   Started symptoms late Friday  Mild ST and mild headache  Then general body aches - not severe  Did not have a thermometer   Then head congestion  Not a lot of cough- if so/ dry  Nose runs clear water   No new shortness of breath - 02 sat 94-96%  No wheezing   Is tired overall    Uses trelegy ellipta  Aso antihistamine     Over the counter  Tylenol every day (baseline) 1000 mg tid   She lives at [facility]    Had covid several years ago  Has had her boosters   Lab Results  Component Value Date   NA 138 05/14/2023   K 4.1 05/14/2023   CO2 30 05/14/2023   GLUCOSE 79 05/14/2023   BUN 17 05/14/2023   CREATININE 0.91 05/14/2023   CALCIUM 9.6 05/14/2023   GFR 59.73 (L) 05/14/2023   EGFR 84 04/30/2022   GFRNONAA >60 04/04/2023   Patient Active Problem List   Diagnosis Date Noted   COVID-19 07/08/2023   Acute on chronic heart failure with preserved ejection fraction (HFpEF) (HCC) 04/04/2023   Hypervolemia 02/08/2023   Chronic diastolic (congestive) heart failure (HCC) 08/20/2022   Closed left hip fracture (HCC) 08/19/2022   Severe mitral regurgitation 02/15/2022   S/P mitral valve clip implantation 02/15/2022    Ovarian tumor of borderline malignancy, right 10/28/2020   Angioedema 05/04/2020   Chronic neuropathic pain 01/27/2020   Acute midline low back pain with right-sided sciatica 10/26/2019   [name] (mycobacterium avium-intracellulare) infection (HCC) 11/01/2018   History of pulmonary embolism 10/31/2018   Orthostatic hypotension 08/25/2018   Chronic fatigue 10/23/2017   Parkinson disease 01/10/2016   Hypercalcemia 09/14/2013   Osteoporosis, post-menopausal 03/14/2012   Chronic dyspnea 03/28/2011   Chronic obstructive airway disease with asthma (HCC) 03/25/2008   History of left breast cancer 03/10/2008   Essential hypertension 02/10/2007   Past Medical History:  Diagnosis Date   Aneurysm of aorta (HCC)    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   Anginal pain (HCC)    a. 10/2021 Cath: Nl cors.   Arthritis    Asthma    Breast cancer (HCC) 2002   infiltrative ductal carcinoma    Cataract 2019   Chronic heart failure with preserved ejection fraction (HFpEF) (HCC)    COPD (chronic obstructive pulmonary disease) (HCC) 07/2007   Coronary artery disease    Ductal carcinoma of breast, estrogen receptor positive, stage 1 (HCC) 09/16/2012   Endometriosis    GERD (gastroesophageal reflux disease)    Hypercalcemia 09/14/2013   Hypertension    Lesion of breast 1992   right, benign   Lung disease    secondary  to [name] infection   Mastalgia 05/1994   Mitral regurgitation    a. 10/2021 RHC: PCWP V wave of 34; b. 01/2022 TEE: Severe flail/prolapse of large area of A2 scallop with severe posteriorly directed MR; c. 01/2022 TEER w /Mitraclip XTW x 2; d. 07/2022 Echo: EF 60-65%. No rwma, mild LVH, GrIII DD. Sev dil LA. Mod elev PASP. Mod-Sev MR. Mod MS.   Osteoporosis, post-menopausal 03/14/2012   Ovarian tumor of borderline malignancy, right 2004   Palpitations    a. 02/2022 Zio: Predominantly sinus rhythm at 67 (52-171).  2 runs of NSVT -fastest 9 beats at 167.  35 SVT runs-longest 19 beats, fastest  171x6 beats.   Pancreatitis    secondary to Cholelithiasis   Parkinson disease 02/10/2015   Post-thoracotomy pain syndrome    Pseudomonas pneumonia (HCC) 03/2008   Status post implantation of mitral valve leaflet clip 02/15/2022   s/p XTW MitraClip x2 by Dr. Excell Seltzer in the A2/P2: b. 04/03/23 s/p third mitraclip implantation.   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   APPENDECTOMY  2004   BREAST BIOPSY  08/2004   right breast-benign   BREAST LUMPECTOMY  1992   benign   BUBBLE STUDY  12/27/2021   Procedure: BUBBLE STUDY;  Surgeon: Sande Rives, MD;  Location: [facility] ENDOSCOPY;  Service: Cardiovascular;;   CARDIAC CATHETERIZATION  2007   essentially negation for significant CAD   CHOLECYSTECTOMY  2001   COLONOSCOPY  2014   Dr Marina Goodell , due 2019   COLONOSCOPY  09/2018   2 TA removed, int hem, no f/u needed (Dr Marina Goodell)    ESOPHAGOGASTRODUODENOSCOPY  09/2018   GERD with esophagitis and stricture dilated, antral gastritis negative for H pylori Marina Goodell)   HIP ARTHROPLASTY Left 08/21/2022   Procedure: ARTHROPLASTY BIPOLAR HIP (HEMIARTHROPLASTY);  Surgeon: Juanell Fairly, MD;  Location: [facility] ORS;  Service: Orthopedics;  Laterality: Left;   LUNG BIOPSY  2002   [name], Dr Edwyna Shell   MASTECTOMY MODIFIED RADICAL Left 2002   oral chemotheraphy (tamoxifen then Armidex) no radiation, Dr.[name]   MITRAL VALVE REPAIR N/A 02/15/2022   Procedure: MITRAL VALVE REPAIR;  Surgeon: Tonny Bollman, MD;  Location: [facility] INVASIVE CV LAB;  Service: Cardiovascular;  Laterality: N/A;   MITRAL VALVE REPAIR N/A 04/03/2023   Procedure: MITRAL VALVE REPAIR;  Surgeon: Tonny Bollman, MD;  Location: [facility] INVASIVE CV LAB;  Service: Cardiovascular;  Laterality: N/A;    RIGHT/LEFT HEART CATH AND CORONARY ANGIOGRAPHY Bilateral 11/14/2021   Procedure: RIGHT/LEFT HEART CATH AND CORONARY ANGIOGRAPHY;  Surgeon: Iran Ouch, MD;  Location: [facility] INVASIVE CV LAB;  Service: Cardiovascular;  Laterality: Bilateral;   TEE WITHOUT CARDIOVERSION N/A 12/29/2018   Procedure: TRANSESOPHAGEAL ECHOCARDIOGRAM (TEE);  Surgeon: Iran Ouch, MD;  Location: [facility] ORS;  Service: Cardiovascular;  Laterality: N/A;   TEE WITHOUT CARDIOVERSION N/A 12/27/2021   Procedure: TRANSESOPHAGEAL ECHOCARDIOGRAM (TEE);  Surgeon: Sande Rives, MD;  Location: [facility] ENDOSCOPY;  Service: Cardiovascular;  Laterality: N/A;   TEE WITHOUT CARDIOVERSION N/A 02/15/2022   Procedure: TRANSESOPHAGEAL ECHOCARDIOGRAM (TEE);  Surgeon: Tonny Bollman, MD;  Location: [facility] INVASIVE CV LAB;  Service: Cardiovascular;  Laterality: N/A;   TEE WITHOUT CARDIOVERSION N/A 05/14/2022   Procedure: TRANSESOPHAGEAL ECHOCARDIOGRAM (TEE);  Surgeon:  Sande Rives, MD;  Location: [facility] ENDOSCOPY;  Service: Cardiovascular;  Laterality: N/A;   TEE WITHOUT CARDIOVERSION N/A 04/03/2023   Procedure: TRANSESOPHAGEAL ECHOCARDIOGRAM;  Surgeon: Tonny Bollman, MD;  Location: [facility] INVASIVE CV LAB;  Service: Cardiovascular;  Laterality: N/A;   TONSILLECTOMY  1946   Social History   Tobacco Use   Smoking status: Never   Smokeless tobacco: Never  Vaping Use   Vaping status: Never Used  Substance Use Topics   Alcohol use: No    Alcohol/week: 0.0 standard drinks of alcohol   Drug use: No   Family History  Problem Relation Age of Onset   Emphysema Mother        d.64 was never a smoker   Lung cancer Father 40       d.82 history of smoking   Breast cancer Sister 62   Colon cancer Sister    Melanoma Maternal Aunt    Liver cancer Maternal Aunt    Cancer Maternal Uncle        unspecified type   Cancer Maternal Uncle        unspecified type   Cancer Maternal Uncle        unspecified type   Stroke Paternal Aunt        in  mid 4s   Osteoporosis Paternal Aunt    Myasthenia gravis Paternal Aunt    Cancer Maternal Grandmother        d.82s unspecified GI cancer   Cancer Maternal Grandfather        d.62s unspecified type   Breast cancer Cousin        d.60s-daughter of unaffected paternal aunt Doristine Church   Colon cancer Cousin 60       d.70-daughter of unaffected maternal aunt Leila   Lung cancer Cousin 70       d.70-sisters to each other, both daughters of maternal uncle Johnny   Parkinson's disease Cousin    Diabetes Neg Hx    Heart disease Neg Hx    Stomach cancer Neg Hx    Ulcerative colitis Neg Hx    Allergies  Allergen Reactions   Sulfa Antibiotics Anaphylaxis   Sulfonamide Derivatives Anaphylaxis   Topamax [Topiramate] Other (See Comments)    Metabolic acidosis    Clarithromycin Other (See Comments)    pericarditis Other reaction(s): Not available   Hydrocodone Other (See Comments)    "hyper and climbing the walls"   Motrin [Ibuprofen] Other (See Comments)    headaches   Misc. Sulfonamide Containing Compounds     Other reaction(s): Not available   Symbicort [Budesonide-Formoterol Fumarate] Other (See Comments)    02/07/15 tremor   Tetracycline Hcl Other (See Comments)   Percocet [Oxycodone-Acetaminophen] Itching and Rash   Tape Itching and Rash    Use paper tape only   Tetracyclines & Related Other (See Comments)    "immediate yeast infection"   Current Outpatient Medications on File Prior to Visit  Medication Sig Dispense Refill   acetaminophen (TYLENOL) 500 MG tablet Take 1,000 mg by mouth 3 (three) times daily.     albuterol (VENTOLIN HFA) 108 (90 Base) MCG/ACT inhaler Inhale 2 puffs into the lungs every 6 (six) hours as needed for wheezing or shortness of breath. 18 g 1   aspirin 81 MG tablet Take 1 tablet (81 mg total) by mouth daily. 30 tablet    calcium carbonate (TUMS - DOSED IN MG ELEMENTAL CALCIUM) 500 MG chewable tablet Chew 2 tablets by mouth daily as needed for  indigestion or  heartburn.     Carbidopa-Levodopa ER (SINEMET CR) 25-100 MG tablet controlled release TAKE TWO TABLET BY MOUTH AT NINE IN THE MORNING, ONE TAB AT ONE IN THE EVENING AND ONE TAB AT FIVE IN THE EVENING 360 tablet 1   Cholecalciferol (VITAMIN D3) 25 MCG (1000 UT) CAPS Take 1 capsule (1,000 Units total) by mouth daily. 30 capsule    clonazePAM (KLONOPIN) 0.5 MG tablet TAKE ONE HALF (1/2) A TABLET BY MOUTH AT BEDTIME 45 tablet 0   diphenhydrAMINE (BENADRYL) 25 MG tablet Take 50 mg by mouth in the morning.     EPINEPHrine 0.3 mg/0.3 mL IJ SOAJ injection Inject 0.3 mg into the muscle as needed for anaphylaxis.     FEROSUL 325 (65 Fe) MG tablet TAKE ONE TABLET BY MOUTH DAILY WITH BREAKFAST 90 tablet 1   Fluticasone-Umeclidin-Vilant (TRELEGY ELLIPTA) 100-62.5-25 MCG/ACT AEPB Inhale 1 puff into the lungs daily. 1 each 6   furosemide (LASIX) 40 MG tablet TAKE ONE TABLET BY MOUTH ONCE DAILY *NEW DOSE* 90 tablet 1   gabapentin ([name]) 300 MG capsule TAKE TWO CAPSULES BY MOUTH THREE TIMES DAILY 540 capsule 1   pantoprazole (PROTONIX) 20 MG tablet Take 1 tablet (20 mg total) by mouth daily.     potassium chloride SA (KLOR-CON M) 20 MEQ tablet TAKE ONE TABLET BY MOUTH ONCE A DAY 60 tablet 1   No current facility-administered medications on file prior to visit.   Review of Systems  Constitutional:  Positive for malaise/fatigue. Negative for chills and fever.  HENT:  Positive for congestion and sore throat. Negative for ear pain and sinus pain.        Clear rhinorrhea   Eyes:  Negative for blurred vision, discharge and redness.  Respiratory:  Positive for cough and shortness of breath. Negative for sputum production, wheezing and stridor.        Very little cough No more shortness of breath than baseline   Cardiovascular:  Negative for chest pain, palpitations and leg swelling.  Gastrointestinal:  Negative for abdominal pain, diarrhea, nausea and vomiting.  Musculoskeletal:  Negative for myalgias.  Skin:   Negative for rash.  Neurological:  Positive for headaches. Negative for dizziness.      Observations/Objective: Patient appears well, in no distress Weight is baseline  No facial swelling or asymmetry Sounds slightly hoarse  Tremor noted  Able to hear the call well  No cough or shortness of breath during interview  Talkative and mentally sharp with no cognitive changes No skin changes on face or neck , no rash or pallor Affect is normal    Assessment and Plan:  Problem List Items Addressed This Visit       Other   COVID-19 - Primary    Day 3 of symptoms in pt with history of lung disease/ bronchiectasis  Uses trelegy ellipta for that  Overall symptoms are mild  Some runny nose and body aches  Taking tylenol  See AVS for symptom care recommendations  Discussed pros/cons of antiviral treatment and possible side effects  Sent prescription for paxlovid to her pharmacy to start today-will update if any problems or side effects  Call back and Er precautions noted in detail today   Discussed isolation and masking precautions also         Relevant Medications   nirmatrelvir/ritonavir (PAXLOVID) 20 x 150 MG & 10 x 100MG  TABS    Follow Up Instructions: Drink fluids and rest  Continue your antihistamine for runny  nose as needed  Nasal saline for congestion as needed  Tylenol for fever or pain or headache  Please alert [place] if symptoms worsen (if severe or short of breath please go to the ER)   Take the Paxlovid as directed  If any intolerable side effects please hold it and let us know   Isolate until your symptoms are better (a minimum of 5 days) Then mask for an additional 10 days when you go back into public  Update if not starting to improve in a week or if worsening    I discussed the assessment and treatment plan with the patient. The patient was provided an opportunity to ask questions and all were answered. The patient agreed with the plan and demonstrated an  understanding of the instructions.   The patient was advised to call back or seek an in-person evaluation if the symptoms worsen or if the condition fails to improve as anticipated.    Roxy Manns, MD

## 2023-07-08 NOTE — Telephone Encounter (Signed)
Have called and set up for virtual with with Dr. Milinda Antis at 11:00. Patient is not feeling well and would like to start medications if not to late.

## 2023-07-08 NOTE — Patient Instructions (Addendum)
Drink fluids and rest  Continue your antihistamine for runny nose as needed  Nasal saline for congestion as needed  Tylenol for fever or pain or headache  Please alert [place] if symptoms worsen (if severe or short of breath please go to the ER)   Take the Paxlovid as directed  If any intolerable side effects please hold it and let us know   Isolate until your symptoms are better (a minimum of 5 days) Then mask for an additional 10 days when you go back into public  Update if not starting to improve in a week or if worsening

## 2023-07-11 ENCOUNTER — Encounter (INDEPENDENT_AMBULATORY_CARE_PROVIDER_SITE_OTHER): Payer: Self-pay

## 2023-07-17 DIAGNOSIS — R4189 Other symptoms and signs involving cognitive functions and awareness: Secondary | ICD-10-CM | POA: Diagnosis not present

## 2023-07-17 DIAGNOSIS — M6281 Muscle weakness (generalized): Secondary | ICD-10-CM | POA: Diagnosis not present

## 2023-07-17 DIAGNOSIS — I5033 Acute on chronic diastolic (congestive) heart failure: Secondary | ICD-10-CM | POA: Diagnosis not present

## 2023-07-17 DIAGNOSIS — R2681 Unsteadiness on feet: Secondary | ICD-10-CM | POA: Diagnosis not present

## 2023-07-17 DIAGNOSIS — Z741 Need for assistance with personal care: Secondary | ICD-10-CM | POA: Diagnosis not present

## 2023-07-17 DIAGNOSIS — G20A1 Parkinson's disease without dyskinesia, without mention of fluctuations: Secondary | ICD-10-CM | POA: Diagnosis not present

## 2023-07-17 DIAGNOSIS — J4489 Other specified chronic obstructive pulmonary disease: Secondary | ICD-10-CM | POA: Diagnosis not present

## 2023-07-17 DIAGNOSIS — I340 Nonrheumatic mitral (valve) insufficiency: Secondary | ICD-10-CM | POA: Diagnosis not present

## 2023-07-17 DIAGNOSIS — R278 Other lack of coordination: Secondary | ICD-10-CM | POA: Diagnosis not present

## 2023-07-19 DIAGNOSIS — G20A1 Parkinson's disease without dyskinesia, without mention of fluctuations: Secondary | ICD-10-CM | POA: Diagnosis not present

## 2023-07-19 DIAGNOSIS — R278 Other lack of coordination: Secondary | ICD-10-CM | POA: Diagnosis not present

## 2023-07-19 DIAGNOSIS — Z741 Need for assistance with personal care: Secondary | ICD-10-CM | POA: Diagnosis not present

## 2023-07-19 DIAGNOSIS — R4189 Other symptoms and signs involving cognitive functions and awareness: Secondary | ICD-10-CM | POA: Diagnosis not present

## 2023-07-19 DIAGNOSIS — M6281 Muscle weakness (generalized): Secondary | ICD-10-CM | POA: Diagnosis not present

## 2023-07-19 DIAGNOSIS — I340 Nonrheumatic mitral (valve) insufficiency: Secondary | ICD-10-CM | POA: Diagnosis not present

## 2023-07-19 DIAGNOSIS — J4489 Other specified chronic obstructive pulmonary disease: Secondary | ICD-10-CM | POA: Diagnosis not present

## 2023-07-19 DIAGNOSIS — R2681 Unsteadiness on feet: Secondary | ICD-10-CM | POA: Diagnosis not present

## 2023-07-19 DIAGNOSIS — I5033 Acute on chronic diastolic (congestive) heart failure: Secondary | ICD-10-CM | POA: Diagnosis not present

## 2023-07-22 ENCOUNTER — Encounter: Payer: Self-pay | Admitting: Ophthalmology

## 2023-07-22 DIAGNOSIS — R2681 Unsteadiness on feet: Secondary | ICD-10-CM | POA: Diagnosis not present

## 2023-07-22 DIAGNOSIS — J4489 Other specified chronic obstructive pulmonary disease: Secondary | ICD-10-CM | POA: Diagnosis not present

## 2023-07-22 DIAGNOSIS — M6281 Muscle weakness (generalized): Secondary | ICD-10-CM | POA: Diagnosis not present

## 2023-07-22 DIAGNOSIS — I5033 Acute on chronic diastolic (congestive) heart failure: Secondary | ICD-10-CM | POA: Diagnosis not present

## 2023-07-22 DIAGNOSIS — I340 Nonrheumatic mitral (valve) insufficiency: Secondary | ICD-10-CM | POA: Diagnosis not present

## 2023-07-22 DIAGNOSIS — Z741 Need for assistance with personal care: Secondary | ICD-10-CM | POA: Diagnosis not present

## 2023-07-22 DIAGNOSIS — R4189 Other symptoms and signs involving cognitive functions and awareness: Secondary | ICD-10-CM | POA: Diagnosis not present

## 2023-07-22 DIAGNOSIS — R278 Other lack of coordination: Secondary | ICD-10-CM | POA: Diagnosis not present

## 2023-07-22 DIAGNOSIS — G20A1 Parkinson's disease without dyskinesia, without mention of fluctuations: Secondary | ICD-10-CM | POA: Diagnosis not present

## 2023-07-22 NOTE — Discharge Instructions (Signed)

## 2023-07-22 NOTE — Anesthesia Preprocedure Evaluation (Signed)
Anesthesia Evaluation  Patient identified by MRN, date of birth, ID band Patient awake    Reviewed: Allergy & Precautions, H&P , NPO status , Patient's Chart, lab work & pertinent test results  Airway Mallampati: III  TM Distance: >3 FB Neck ROM: Full    Dental no notable dental hx. (+) Caps   Pulmonary asthma , pneumonia, COPD   Pulmonary exam normal breath sounds clear to auscultation       Cardiovascular hypertension, + angina  + CAD and +CHF  Normal cardiovascular exam+ Valvular Problems/Murmurs MR  Rhythm:Regular Rate:Normal  04-03-23 Successful TEER of the mitral valve with a MitraClip G4 XTW device, positioned A2/P2, lateral to the indwelling Clip devices   MR reduced from 4+ to 2+ post-procedure   05-15-23 1. Left ventricular ejection fraction, by estimation, is 60 to 65%. The  left ventricle has normal function. The left ventricle has no regional  wall motion abnormalities. There is mild left ventricular hypertrophy.  Left ventricular diastolic parameters  are indeterminate.   2. Right ventricular systolic function is normal. The right ventricular  size is mildly enlarged. There is mildly elevated pulmonary artery  systolic pressure. The estimated right ventricular systolic pressure is  38.5 mmHg.   3. The mitral valve has been repaired/replaced. There is a Mitra-Clip  present in the mitral position. The average mean gradient is at HR  56bpm. There is moderate mitral regurgitation that appears slightly more  significant compared to prior TTE  best appreciated on SAX views.   4. Left atrial size was severely dilated.   5. An iatrogenic ASD is present with left to right shunting. Evidence of  atrial level shunting detected by color flow Doppler.   6. Right atrial size was moderately dilated.   7. The aortic valve is tricuspid. There is mild calcification of the  aortic valve. There is mild thickening of the  aortic valve. Aortic valve  regurgitation is not visualized. Aortic valve sclerosis/calcification is  present, without any evidence of  aortic stenosis.   8. Aortic dilatation noted. There is mild dilatation of the ascending  aorta, measuring 41 mm.   9. The inferior vena cava is normal in size with greater than 50%  respiratory variability, suggesting right atrial pressure of 3 mmHg.   Now has moderate MR    Neuro/Psych Parkinson's disease  Neuromuscular disease negative neurological ROS  negative psych ROS   GI/Hepatic Neg liver ROS,GERD  ,,  Endo/Other  negative endocrine ROS    Renal/GU negative Renal ROS  negative genitourinary   Musculoskeletal  (+) Arthritis ,    Abdominal   Peds negative pediatric ROS (+)  Hematology negative hematology ROS (+)   Anesthesia Other Findings Pancreatitis Post-thoracotomy pain syndrome Endometriosis  Aneurysm of aorta (HCC) Osteoporosis, post-menopausal  Ductal carcinoma of breast, estrogen receptor positive, stage 1 (HCC) Pseudomonas pneumonia (HCC)  COPD (chronic obstructive pulmonary disease)  Mastalgia  Hypercalcemia  Vitamin D deficiency Parkinson disease  Breast cancer (HCC) Hypertension  Arthritis Asthma  GERD (gastroesophageal reflux disease) Cataract  Anginal pain (HCC) Status post implantation of mitral valve leaflet clip  Chronic heart failure with preserved ejection fraction (HFpEF) (HCC) Mitral regurgitation  Palpitations Coronary artery disease     Reproductive/Obstetrics negative OB ROS                              Anesthesia Physical Anesthesia Plan  ASA: 4  Anesthesia Plan: MAC  Post-op Pain Management:    Induction: Intravenous  PONV Risk Score and Plan:   Airway Management Planned: Natural Airway and Nasal Cannula  Additional Equipment:   Intra-op Plan:   Post-operative Plan:   Informed Consent: I have reviewed the patients History and Physical,  chart, labs and discussed the procedure including the risks, benefits and alternatives for the proposed anesthesia with the patient or authorized representative who has indicated his/her understanding and acceptance.     Dental Advisory Given  Plan Discussed with: Anesthesiologist, CRNA and Surgeon  Anesthesia Plan Comments: (Patient consented for risks of anesthesia including but not limited to:  - adverse reactions to medications - damage to eyes, teeth, lips or other oral mucosa - nerve damage due to positioning  - sore throat or hoarseness - Damage to heart, brain, nerves, lungs, other parts of body or loss of life  Patient voiced understanding.)         Anesthesia Quick Evaluation

## 2023-07-23 DIAGNOSIS — R4189 Other symptoms and signs involving cognitive functions and awareness: Secondary | ICD-10-CM | POA: Diagnosis not present

## 2023-07-23 DIAGNOSIS — R2681 Unsteadiness on feet: Secondary | ICD-10-CM | POA: Diagnosis not present

## 2023-07-23 DIAGNOSIS — I5033 Acute on chronic diastolic (congestive) heart failure: Secondary | ICD-10-CM | POA: Diagnosis not present

## 2023-07-23 DIAGNOSIS — G20A1 Parkinson's disease without dyskinesia, without mention of fluctuations: Secondary | ICD-10-CM | POA: Diagnosis not present

## 2023-07-23 DIAGNOSIS — Z741 Need for assistance with personal care: Secondary | ICD-10-CM | POA: Diagnosis not present

## 2023-07-23 DIAGNOSIS — R278 Other lack of coordination: Secondary | ICD-10-CM | POA: Diagnosis not present

## 2023-07-23 DIAGNOSIS — I7091 Generalized atherosclerosis: Secondary | ICD-10-CM | POA: Diagnosis not present

## 2023-07-23 DIAGNOSIS — J4489 Other specified chronic obstructive pulmonary disease: Secondary | ICD-10-CM | POA: Diagnosis not present

## 2023-07-23 DIAGNOSIS — M6281 Muscle weakness (generalized): Secondary | ICD-10-CM | POA: Diagnosis not present

## 2023-07-23 DIAGNOSIS — B351 Tinea unguium: Secondary | ICD-10-CM | POA: Diagnosis not present

## 2023-07-23 DIAGNOSIS — I340 Nonrheumatic mitral (valve) insufficiency: Secondary | ICD-10-CM | POA: Diagnosis not present

## 2023-07-24 ENCOUNTER — Ambulatory Visit
Admission: RE | Admit: 2023-07-24 | Discharge: 2023-07-24 | Disposition: A | Discharge status: Home / Self Care | Payer: Medicare PPO | Source: Ambulatory Visit | Attending: Ophthalmology | Admitting: Ophthalmology

## 2023-07-24 ENCOUNTER — Other Ambulatory Visit: Payer: Self-pay

## 2023-07-24 ENCOUNTER — Ambulatory Visit: Payer: Medicare PPO | Admitting: Anesthesiology

## 2023-07-24 ENCOUNTER — Encounter
Admission: RE | Disposition: A | Discharge status: Home / Self Care | Payer: Self-pay | Source: Ambulatory Visit | Attending: Ophthalmology

## 2023-07-24 DIAGNOSIS — G20A1 Parkinson's disease without dyskinesia, without mention of fluctuations: Secondary | ICD-10-CM | POA: Diagnosis not present

## 2023-07-24 DIAGNOSIS — I110 Hypertensive heart disease with heart failure: Secondary | ICD-10-CM | POA: Insufficient documentation

## 2023-07-24 DIAGNOSIS — H269 Unspecified cataract: Secondary | ICD-10-CM | POA: Diagnosis not present

## 2023-07-24 DIAGNOSIS — K219 Gastro-esophageal reflux disease without esophagitis: Secondary | ICD-10-CM | POA: Diagnosis not present

## 2023-07-24 DIAGNOSIS — H2512 Age-related nuclear cataract, left eye: Secondary | ICD-10-CM | POA: Insufficient documentation

## 2023-07-24 DIAGNOSIS — I2510 Atherosclerotic heart disease of native coronary artery without angina pectoris: Secondary | ICD-10-CM | POA: Insufficient documentation

## 2023-07-24 DIAGNOSIS — J449 Chronic obstructive pulmonary disease, unspecified: Secondary | ICD-10-CM | POA: Diagnosis not present

## 2023-07-24 DIAGNOSIS — I5032 Chronic diastolic (congestive) heart failure: Secondary | ICD-10-CM | POA: Diagnosis not present

## 2023-07-24 DIAGNOSIS — Z66 Do not resuscitate: Secondary | ICD-10-CM | POA: Diagnosis not present

## 2023-07-24 HISTORY — DX: Other specified postprocedural states: Z98.890

## 2023-07-24 HISTORY — DX: Atherosclerotic heart disease of native coronary artery without angina pectoris: I25.10

## 2023-07-24 HISTORY — DX: Nonrheumatic mitral (valve) insufficiency: I34.0

## 2023-07-24 HISTORY — PX: CATARACT EXTRACTION W/PHACO: SHX586

## 2023-07-24 SURGERY — PHACOEMULSIFICATION, CATARACT, WITH IOL INSERTION
Anesthesia: Monitor Anesthesia Care | Site: Eye | Laterality: Left

## 2023-07-24 MED ORDER — SIGHTPATH DOSE#1 NA HYALUR & NA CHOND-NA HYALUR IO KIT
PACK | INTRAOCULAR | Status: DC | PRN
  Administered 2023-07-24: 1 via OPHTHALMIC

## 2023-07-24 MED ORDER — BRIMONIDINE TARTRATE-TIMOLOL 0.2-0.5 % OP SOLN
OPHTHALMIC | Status: DC | PRN
  Administered 2023-07-24: 1 [drp] via OPHTHALMIC

## 2023-07-24 MED ORDER — CEFUROXIME OPHTHALMIC INJECTION 1 MG/0.1 ML
INJECTION | OPHTHALMIC | Status: DC | PRN
  Administered 2023-07-24: .1 mL via INTRACAMERAL

## 2023-07-24 MED ORDER — SIGHTPATH DOSE#1 BSS IO SOLN
INTRAOCULAR | Status: DC | PRN
  Administered 2023-07-24: 65 mL via OPHTHALMIC

## 2023-07-24 MED ORDER — MIDAZOLAM HCL 2 MG/2ML IJ SOLN
INTRAMUSCULAR | Status: DC | PRN
  Administered 2023-07-24: 1 mg via INTRAVENOUS

## 2023-07-24 MED ORDER — LACTATED RINGERS IV SOLN: INTRAVENOUS | Status: DC

## 2023-07-24 MED ORDER — SIGHTPATH DOSE#1 BSS IO SOLN
INTRAOCULAR | Status: DC | PRN
  Administered 2023-07-24: 2 mL

## 2023-07-24 MED ORDER — SIGHTPATH DOSE#1 BSS IO SOLN
INTRAOCULAR | Status: DC | PRN
  Administered 2023-07-24: 15 mL via INTRAOCULAR

## 2023-07-24 MED ORDER — TETRACAINE HCL 0.5 % OP SOLN
1.0000 [drp] | OPHTHALMIC | Status: DC | PRN
  Administered 2023-07-24 (×3): 1 [drp] via OPHTHALMIC

## 2023-07-24 MED ORDER — ARMC OPHTHALMIC DILATING DROPS
1.0000 | OPHTHALMIC | Status: DC | PRN
  Administered 2023-07-24 (×3): 1 via OPHTHALMIC

## 2023-07-24 SURGICAL SUPPLY — 11 items
CANNULA ANT/CHMB 27G (MISCELLANEOUS) IMPLANT
CANNULA ANT/CHMB 27GA (MISCELLANEOUS) IMPLANT
CATARACT SUITE SIGHTPATH (MISCELLANEOUS) ×1 IMPLANT
FEE CATARACT SUITE SIGHTPATH (MISCELLANEOUS) ×2 IMPLANT
GLOVE SRG 8 PF TXTR STRL LF DI (GLOVE) ×2 IMPLANT
GLOVE SURG ENC TEXT LTX SZ7.5 (GLOVE) ×2 IMPLANT
GLOVE SURG UNDER POLY LF SZ8 (GLOVE) ×1
LENS IOL TECNIS EYHANCE 19.0 (Intraocular Lens) IMPLANT
NDL FILTER BLUNT 18X1 1/2 (NEEDLE) ×2 IMPLANT
NEEDLE FILTER BLUNT 18X1 1/2 (NEEDLE) ×1 IMPLANT
SYR 3ML LL SCALE MARK (SYRINGE) ×2 IMPLANT

## 2023-07-24 NOTE — Transfer of Care (Signed)
Immediate Anesthesia Transfer of Care Note  Patient: Crystal Bass  Procedure(s) Performed: CATARACT EXTRACTION PHACO AND INTRAOCULAR LENS PLACEMENT (IOC) LEFT 5.34 0037.3 (Left: Eye)  Patient Location: PACU  Anesthesia Type: MAC  Level of Consciousness: awake, alert  and patient cooperative  Airway and Oxygen Therapy: Patient Spontanous Breathing and Patient connected to supplemental oxygen  Post-op Assessment: Post-op Vital signs reviewed, Patient's Cardiovascular Status Stable, Respiratory Function Stable, Patent Airway and No signs of Nausea or vomiting  Post-op Vital Signs: Reviewed and stable  Complications: No notable events documented.

## 2023-07-24 NOTE — Anesthesia Postprocedure Evaluation (Signed)
Anesthesia Post Note  Patient: HAYDN COOGAN  Procedure(s) Performed: CATARACT EXTRACTION PHACO AND INTRAOCULAR LENS PLACEMENT (IOC) LEFT 5.34 0037.3 (Left: Eye)  Patient location during evaluation: PACU Anesthesia Type: MAC Level of consciousness: awake and alert Pain management: pain level controlled Vital Signs Assessment: post-procedure vital signs reviewed and stable Respiratory status: spontaneous breathing, nonlabored ventilation, respiratory function stable and patient connected to nasal cannula oxygen Cardiovascular status: stable and blood pressure returned to baseline Postop Assessment: no apparent nausea or vomiting Anesthetic complications: no   No notable events documented.   Last Vitals:  Vitals:   07/24/23 1046 07/24/23 1050  BP: 107/73 118/76  Pulse: 70 69  Resp: (!) 29 (!) 27  Temp: 36.5 C 36.5 C  SpO2: 97% 95%    Last Pain:  Vitals:   07/24/23 1050  TempSrc:   PainSc: 0-No pain                 Nichlas Pitera C Alyssha Housh

## 2023-07-24 NOTE — H&P (Signed)
[facility]   Primary Care Physician:  Eustaquio Boyden, MD Ophthalmologist: Dr. Lockie Mola  Pre-Procedure History & Physical: HPI:  Crystal Bass is a 82 y.o. adult here for ophthalmic surgery.   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 10/2021 Cath: Nl cors.   Arthritis    Asthma    Breast cancer (HCC) 2002   infiltrative ductal carcinoma    Cataract 2019   Chronic heart failure with preserved ejection fraction (HFpEF) (HCC)    COPD (chronic obstructive pulmonary disease) (HCC) 07/2007   Coronary artery disease    Ductal carcinoma of breast, estrogen receptor positive, stage 1 (HCC) 09/16/2012   Endometriosis    GERD (gastroesophageal reflux disease)    Hx of mitral valve repair    Hypercalcemia 09/14/2013   Hypertension    Lesion of breast 1992   right, benign   Lung disease    secondary to [name] infection   Mastalgia 05/1994   Mitral regurgitation    a. 10/2021 RHC: PCWP V wave of 34; b. 01/2022 TEE: Severe flail/prolapse of large area of A2 scallop with severe posteriorly directed MR; c. 01/2022 TEER w /Mitraclip XTW x 2; d. 07/2022 Echo: EF 60-65%. No rwma, mild LVH, GrIII DD. Sev dil LA. Mod elev PASP. Mod-Sev MR. Mod MS.   Moderate mitral regurgitation by prior echocardiogram    Osteoporosis, post-menopausal 03/14/2012   Ovarian tumor of borderline malignancy, right 2004   Palpitations    a. 02/2022 Zio: Predominantly sinus rhythm at 67 (52-171).  2 runs of NSVT -fastest 9 beats at 167.  35 SVT runs-longest 19 beats, fastest 171x6 beats.   Pancreatitis    secondary to Cholelithiasis   Parkinson disease 02/10/2015   Post-thoracotomy pain syndrome    Pseudomonas pneumonia (HCC) 03/2008   Status post implantation of mitral valve leaflet clip 02/15/2022   s/p XTW MitraClip x2 by Dr. Excell Seltzer in the A2/P2: b. 04/03/23 s/p third mitraclip implantation.   Traumatic  closed fracture of distal clavicle with minimal displacement, left, initial encounter 06/2021   [facility]   Uterine fibroid    Vitamin D deficiency     Past Surgical History:  Procedure Laterality Date   ABDOMINAL HYSTERECTOMY     & BSO for Mucinous borderline tumor of R ovary 2004   APPENDECTOMY  2004   BREAST BIOPSY  08/2004   right breast-benign   BREAST LUMPECTOMY  1992   benign   BUBBLE STUDY  12/27/2021   Procedure: BUBBLE STUDY;  Surgeon: Sande Rives, MD;  Location: [facility] ENDOSCOPY;  Service: Cardiovascular;;   CARDIAC CATHETERIZATION  2007   essentially negation for significant CAD   CHOLECYSTECTOMY  2001   COLONOSCOPY  2014   Dr Marina Goodell , due 2019   COLONOSCOPY  09/2018   2 TA removed, int hem, no f/u needed (Dr Marina Goodell)    ESOPHAGOGASTRODUODENOSCOPY  09/2018   GERD with esophagitis and stricture dilated, antral gastritis negative for H pylori Marina Goodell)   HIP ARTHROPLASTY Left 08/21/2022   Procedure: ARTHROPLASTY BIPOLAR HIP (HEMIARTHROPLASTY);  Surgeon: Juanell Fairly, MD;  Location: [facility] ORS;  Service: Orthopedics;  Laterality: Left;   LUNG BIOPSY  2002   [name], Dr Edwyna Shell   MASTECTOMY MODIFIED RADICAL Left 2002   oral chemotheraphy (tamoxifen then Armidex) no radiation, Dr.[name]   MITRAL VALVE REPAIR N/A 02/15/2022  Procedure: MITRAL VALVE REPAIR;  Surgeon: Tonny Bollman, MD;  Location: [facility] INVASIVE CV LAB;  Service: Cardiovascular;  Laterality: N/A;   MITRAL VALVE REPAIR N/A 04/03/2023   Procedure: MITRAL VALVE REPAIR;  Surgeon: Tonny Bollman, MD;  Location: [facility] INVASIVE CV LAB;  Service: Cardiovascular;  Laterality: N/A;   RIGHT/LEFT HEART CATH AND CORONARY ANGIOGRAPHY Bilateral 11/14/2021   Procedure: RIGHT/LEFT HEART CATH AND CORONARY ANGIOGRAPHY;  Surgeon: Iran Ouch, MD;  Location: [facility] INVASIVE CV LAB;  Service: Cardiovascular;  Laterality: Bilateral;   TEE WITHOUT CARDIOVERSION N/A 12/29/2018   Procedure: TRANSESOPHAGEAL ECHOCARDIOGRAM (TEE);   Surgeon: Iran Ouch, MD;  Location: [facility] ORS;  Service: Cardiovascular;  Laterality: N/A;   TEE WITHOUT CARDIOVERSION N/A 12/27/2021   Procedure: TRANSESOPHAGEAL ECHOCARDIOGRAM (TEE);  Surgeon: Sande Rives, MD;  Location: [facility] ENDOSCOPY;  Service: Cardiovascular;  Laterality: N/A;   TEE WITHOUT CARDIOVERSION N/A 02/15/2022   Procedure: TRANSESOPHAGEAL ECHOCARDIOGRAM (TEE);  Surgeon: Tonny Bollman, MD;  Location: [facility] INVASIVE CV LAB;  Service: Cardiovascular;  Laterality: N/A;   TEE WITHOUT CARDIOVERSION N/A 05/14/2022   Procedure: TRANSESOPHAGEAL ECHOCARDIOGRAM (TEE);  Surgeon: Sande Rives, MD;  Location: [facility] ENDOSCOPY;  Service: Cardiovascular;  Laterality: N/A;   TEE WITHOUT CARDIOVERSION N/A 04/03/2023   Procedure: TRANSESOPHAGEAL ECHOCARDIOGRAM;  Surgeon: Tonny Bollman, MD;  Location: [facility] INVASIVE CV LAB;  Service: Cardiovascular;  Laterality: N/A;   TONSILLECTOMY  1946    Prior to Admission medications   Medication Sig Start Date End Date Taking? Authorizing Provider  acetaminophen (TYLENOL) 500 MG tablet Take 1,000 mg by mouth 3 (three) times daily.   Yes [provider]  albuterol (VENTOLIN HFA) 108 (90 Base) MCG/ACT inhaler Inhale 2 puffs into the lungs every 6 (six) hours as needed for wheezing or shortness of breath. 06/04/22  Yes Eustaquio Boyden, MD  aspirin 81 MG tablet Take 1 tablet (81 mg total) by mouth daily. 01/27/19  Yes Eustaquio Boyden, MD  calcium carbonate (TUMS - DOSED IN MG ELEMENTAL CALCIUM) 500 MG chewable tablet Chew 2 tablets by mouth daily as needed for indigestion or heartburn.   Yes [provider]  Carbidopa-Levodopa ER (SINEMET CR) 25-100 MG tablet controlled release TAKE TWO TABLET BY MOUTH AT NINE IN THE MORNING, ONE TAB AT ONE IN THE EVENING AND ONE TAB AT FIVE IN THE EVENING 02/06/23  Yes Tat, Octaviano Batty, DO  Cholecalciferol (VITAMIN D3) 25 MCG (1000 UT) CAPS Take 1 capsule (1,000 Units total) by mouth daily. 09/27/22  Yes  Eustaquio Boyden, MD  clonazePAM (KLONOPIN) 0.5 MG tablet TAKE ONE HALF (1/2) A TABLET BY MOUTH AT BEDTIME 05/09/23  Yes Hill, Manus Gunning, MD  diphenhydrAMINE (BENADRYL) 25 MG tablet Take 50 mg by mouth in the morning.   Yes [provider]  EPINEPHrine 0.3 mg/0.3 mL IJ SOAJ injection Inject 0.3 mg into the muscle as needed for anaphylaxis. 02/25/20  Yes [provider]  FEROSUL 325 (65 Fe) MG tablet TAKE ONE TABLET BY MOUTH DAILY WITH BREAKFAST 02/07/23  Yes Eustaquio Boyden, MD  Fluticasone-Umeclidin-Vilant (TRELEGY ELLIPTA) 100-62.5-25 MCG/ACT AEPB Inhale 1 puff into the lungs daily. 04/27/22  Yes Cobb, Ruby Cola, NP  furosemide (LASIX) 40 MG tablet TAKE ONE TABLET BY MOUTH ONCE DAILY *NEW DOSE* 03/29/23  Yes Iran Ouch, MD  gabapentin ([name]) 300 MG capsule TAKE TWO CAPSULES BY MOUTH THREE TIMES DAILY 02/07/23  Yes Eustaquio Boyden, MD  pantoprazole (PROTONIX) 20 MG tablet Take 1 tablet (20 mg total) by mouth daily. 04/01/23  Yes  Eustaquio Boyden, MD  potassium chloride SA (KLOR-CON M) 20 MEQ tablet TAKE ONE TABLET BY MOUTH ONCE A DAY 01/30/23  Yes Iran Ouch, MD    Allergies as of 06/05/2023 - Review Complete 05/15/2023  Allergen Reaction Noted   Sulfa antibiotics Anaphylaxis 10/19/2020   Sulfonamide derivatives Anaphylaxis    Topamax [topiramate] Other (See Comments) 04/27/2013   Clarithromycin Other (See Comments) 04/13/2013   Hydrocodone Other (See Comments) 03/13/2011   Motrin [ibuprofen] Other (See Comments) 04/13/2013   Misc. sulfonamide containing compounds  08/19/2022   Symbicort [budesonide-formoterol fumarate] Other (See Comments) 02/07/2015   Tetracycline hcl Other (See Comments) 10/19/2020   Percocet [oxycodone-acetaminophen] Itching and Rash 04/13/2013   Tape Itching and Rash 02/17/2013   Tetracyclines & related Other (See Comments) 03/13/2011    Family History  Problem Relation Age of Onset   Emphysema Mother        d.64 was never a smoker    Lung cancer Father 14       d.82 history of smoking   Breast cancer Sister 56   Colon cancer Sister    Melanoma Maternal Aunt    Liver cancer Maternal Aunt    Cancer Maternal Uncle        unspecified type   Cancer Maternal Uncle        unspecified type   Cancer Maternal Uncle        unspecified type   Stroke Paternal Aunt        in mid 33s   Osteoporosis Paternal Aunt    Myasthenia gravis Paternal Aunt    Cancer Maternal Grandmother        d.82s unspecified GI cancer   Cancer Maternal Grandfather        d.62s unspecified type   Breast cancer Cousin        d.60s-daughter of unaffected paternal aunt Doristine Church   Colon cancer Cousin 60       d.70-daughter of unaffected maternal aunt Leila   Lung cancer Cousin 70       d.70-sisters to each other, both daughters of maternal uncle Johnny   Parkinson's disease Cousin    Diabetes Neg Hx    Heart disease Neg Hx    Stomach cancer Neg Hx    Ulcerative colitis Neg Hx     Social History   Socioeconomic History   Marital status: Widowed    Spouse name: Not on file   Number of children: 0   Years of education: Not on file   Highest education level: Not on file  Occupational History   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, 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, 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, 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  Tobacco Use   Smoking status: Never   Smokeless tobacco: Never  Vaping Use   Vaping status: Never Used  Substance and Sexual Activity   Alcohol use: No    Alcohol/week: 0.0 standard drinks of alcohol   Drug use: No   Sexual activity: Not Currently    Birth control/protection: Surgical    Comment: TAH/BSO  Other Topics Concern   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    Aunt of Dr Gilmore Laroche    L[facility] independent [facility] at [facility]    No children    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, 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, 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, 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)    Activity: walking about 1 mile/day, enjoys [employer]    Diet: some water, fruits/vegetables some    Right handed    Social Determinants  of Dispensing optician  Resource Strain: Low Risk  (08/13/2022)   Overall Financial Resource Strain (CARDIA)    Difficulty of Paying Living Expenses: Not hard at all  Food Insecurity: No Food Insecurity (11/28/2022)   Hunger Vital Sign    Worried About Running Out of Food in the Last Year: Never true    Ran Out of Food in the Last Year: Never true  Transportation Needs: No Transportation Needs (11/28/2022)   PRAPARE - Administrator, Civil Service (Medical): No    Lack of Transportation (Non-Medical): No  Recent Concern: Transportation Needs - Unmet Transportation Needs (11/22/2022)   PRAPARE - Transportation    Lack of Transportation (Medical): No    Lack of Transportation (Non-Medical): Yes  Physical Activity: Insufficiently Active (08/13/2022)   Exercise Vital Sign    Days of Exercise per Week: 4 days    Minutes of Exercise per Session: 30 min  Stress: No Stress Concern Present (08/13/2022)   [employer] of Occupational Health - Occupational Stress Questionnaire    Feeling of Stress : Not at all  Social Connections: Socially Isolated (08/13/2022)   Social Connection and Isolation Panel [NHANES]    Frequency of Communication with Friends and Family: More than three times a week    Frequency of Social Gatherings with Friends and Family: Once a week    Attends Religious Services: Never    Database 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 or Organizations: No    Attends Banker Meetings: Never    Marital Status: Widowed  Intimate Partner Violence: Not At Risk (11/22/2022)   Humiliation, Afraid, Rape, and Kick questionnaire    Fear of Current or Ex-Partner: No    Emotionally Abused: No    Physically Abused: No    Sexually Abused: No    Review of Systems: See HPI, otherwise negative ROS  Physical Exam: BP 132/81   Pulse 72   Temp 98.6 F (37 C) (Temporal)   Resp 14   Ht 5\' 2"  (1.575 m)   Wt 53.5 kg   SpO2 94%   BMI 21.58 kg/m  General:   Alert,  pleasant and cooperative in  NAD Head:  Normocephalic and atraumatic. Lungs:  Clear to auscultation.    Heart:  Regular rate and rhythm.   Impression/Plan: Crystal Bass is here for ophthalmic surgery.  Risks, benefits, limitations, and alternatives regarding ophthalmic surgery have been reviewed with the patient.  Questions have been answered.  All parties agreeable.   Lockie Mola, MD  07/24/2023, 9:20 AM

## 2023-07-24 NOTE — Op Note (Signed)
OPERATIVE NOTE  Crystal Bass [id number] 07/24/2023   PREOPERATIVE DIAGNOSIS:  Nuclear sclerotic cataract left eye. H25.12   POSTOPERATIVE DIAGNOSIS:    Nuclear sclerotic cataract left eye.     PROCEDURE:  Phacoemusification with posterior chamber intraocular lens placement of the left eye  Ultrasound time: Procedure(s): CATARACT EXTRACTION PHACO AND INTRAOCULAR LENS PLACEMENT (IOC) LEFT 5.34 0037.3 (Left)  LENS:   Implant Name Type Inv. Item Serial No. Manufacturer Lot No. LRB No. Used Action  LENS IOL TECNIS EYHANCE 19.0 - [id number] Intraocular Lens LENS IOL TECNIS EYHANCE 19.0 [id number] [employer]  Left 1 Implanted      SURGEON:  Deirdre Evener, MD   ANESTHESIA:  Topical with tetracaine drops and 2% Xylocaine jelly, augmented with 1% preservative-free intracameral lidocaine.    COMPLICATIONS:  None.   DESCRIPTION OF PROCEDURE:  The patient was identified in the holding room and transported to the operating room and placed in the supine position under the operating microscope.  The left eye was identified as the operative eye and it was prepped and draped in the usual sterile ophthalmic fashion.   A 1 millimeter clear-corneal paracentesis was made at the 1:30 position.  0.5 ml of preservative-free 1% lidocaine was injected into the anterior chamber.  The anterior chamber was filled with Viscoat viscoelastic.  A 2.4 millimeter keratome was used to make a near-clear corneal incision at the 10:30 position.  .  A curvilinear capsulorrhexis was made with a cystotome and capsulorrhexis forceps.  Balanced salt solution was used to hydrodissect and hydrodelineate the nucleus.   Phacoemulsification was then used in stop and chop fashion to remove the lens nucleus and epinucleus.  The remaining cortex was then removed using the irrigation and aspiration handpiece. Provisc was then placed into the capsular bag to distend it for lens placement.  A lens was then injected into the  capsular bag.  The remaining viscoelastic was aspirated.   Wounds were hydrated with balanced salt solution.  The anterior chamber was inflated to a physiologic pressure with balanced salt solution.  No wound leaks were noted. Cefuroxime 0.1 ml of a 10mg /ml solution was injected into the anterior chamber for a dose of 1 mg of intracameral antibiotic at the completion of the case.   Timolol and Brimonidine drops were applied to the eye.  The patient was taken to the recovery room in stable condition without complications of anesthesia or surgery.  Crystal Bass 07/24/2023, 10:44 AM

## 2023-07-25 ENCOUNTER — Encounter: Payer: Self-pay | Admitting: Ophthalmology

## 2023-07-25 DIAGNOSIS — H2511 Age-related nuclear cataract, right eye: Secondary | ICD-10-CM | POA: Diagnosis not present

## 2023-07-26 DIAGNOSIS — I340 Nonrheumatic mitral (valve) insufficiency: Secondary | ICD-10-CM | POA: Diagnosis not present

## 2023-07-26 DIAGNOSIS — Z741 Need for assistance with personal care: Secondary | ICD-10-CM | POA: Diagnosis not present

## 2023-07-26 DIAGNOSIS — I5033 Acute on chronic diastolic (congestive) heart failure: Secondary | ICD-10-CM | POA: Diagnosis not present

## 2023-07-26 DIAGNOSIS — G20A1 Parkinson's disease without dyskinesia, without mention of fluctuations: Secondary | ICD-10-CM | POA: Diagnosis not present

## 2023-07-26 DIAGNOSIS — J4489 Other specified chronic obstructive pulmonary disease: Secondary | ICD-10-CM | POA: Diagnosis not present

## 2023-07-26 DIAGNOSIS — M6281 Muscle weakness (generalized): Secondary | ICD-10-CM | POA: Diagnosis not present

## 2023-07-26 DIAGNOSIS — R2681 Unsteadiness on feet: Secondary | ICD-10-CM | POA: Diagnosis not present

## 2023-07-26 DIAGNOSIS — R4189 Other symptoms and signs involving cognitive functions and awareness: Secondary | ICD-10-CM | POA: Diagnosis not present

## 2023-07-26 DIAGNOSIS — R278 Other lack of coordination: Secondary | ICD-10-CM | POA: Diagnosis not present

## 2023-07-29 DIAGNOSIS — R2681 Unsteadiness on feet: Secondary | ICD-10-CM | POA: Diagnosis not present

## 2023-07-29 DIAGNOSIS — Z741 Need for assistance with personal care: Secondary | ICD-10-CM | POA: Diagnosis not present

## 2023-07-29 DIAGNOSIS — I5033 Acute on chronic diastolic (congestive) heart failure: Secondary | ICD-10-CM | POA: Diagnosis not present

## 2023-07-29 DIAGNOSIS — M6281 Muscle weakness (generalized): Secondary | ICD-10-CM | POA: Diagnosis not present

## 2023-07-29 DIAGNOSIS — G20A1 Parkinson's disease without dyskinesia, without mention of fluctuations: Secondary | ICD-10-CM | POA: Diagnosis not present

## 2023-07-29 DIAGNOSIS — J4489 Other specified chronic obstructive pulmonary disease: Secondary | ICD-10-CM | POA: Diagnosis not present

## 2023-07-29 DIAGNOSIS — R4189 Other symptoms and signs involving cognitive functions and awareness: Secondary | ICD-10-CM | POA: Diagnosis not present

## 2023-07-29 DIAGNOSIS — R278 Other lack of coordination: Secondary | ICD-10-CM | POA: Diagnosis not present

## 2023-07-29 DIAGNOSIS — I340 Nonrheumatic mitral (valve) insufficiency: Secondary | ICD-10-CM | POA: Diagnosis not present

## 2023-07-30 NOTE — Progress Notes (Unsigned)
Assessment/Plan:   1.  Parkinsons Disease  -Continue carbidopa/levodopa 25/100 CR, 2/1/1.  Changing from IR did not change hypersomnolence, but I did not change back because of orthostasis. 2.  EDS             -don't think that related to PD             -this is the biggest c/o.     3.  Orthostatic hypotension, with history of associated syncope             -Refuses compression binder.             -Previously prescribed Northera but did not take.    -Patient is on blood pressure medications, but does not want to change that given her worry about her thoracic aortic aneurysm.  She does not want to stop her amlodopine.  States that when she did this in the past, her blood pressure raised up too high.  Discussed the concept of permissive hypertension with her with Parkinson's disease. 4.  REM behavior disorder              -Continue clonazepam 0.5 mg, half tablet at bedtime.[name]  PDMP reviewed.  Last distribution January 01, 2023. 5.  Mild cognitive impairment             -Last neurocognitive testing was quite a long time ago in September, 2018.  I have not seen significant change since that time and we discussed this again today.  She is noting some worsening of memory and discussed repeating but she wants to hold for now 6.  Severe mitral valve stenosis  -Status post mitral valve repair in March, 2023 and a second repair that was successful in May, 2024.  -Post second repair, patient is much better, but still remains with mild to moderate MR  -Patient continues with exertional dyspnea/shortness of breath. 7.  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  My previous records were reviewed prior to todays visit as well as outside records available to me.  Cardiology notes are reviewed.  She has moved from  independent to assisted living at [facility] since our last visit.  She needed extra assistance with meals and grocery shopping and cooking.  She continues to be limited from a cardiac standpoint by shortness of breath and fatigue.  Notes from Dr. Excell Seltzer indicate that she has severe grade 4+ symptomatic mitral regurgitation.  She was referred to cardiothoracic for a surgical consult at the end of April.  Ultimately, because of her anatomy and also because of her age, it was decided to defer on any type of surgical intervention with the cardiothoracic surgeons.  Plans were made for a second mitral valve repair with cardiology via TEER.  This was successfully completed on Apr 03, 2023.  Separately from this, patient unfortunately did have COVID last month.  She was treated with Paxlovid.  Last month, she also had cataracts extracted on the left.  Current prescribed movement disorder medications: Carbidopa/levodopa 25/100 CR, 2 tablets in the morning, 1 in the afternoon and 1 in the evening Clonazepam 0.5 mg, half tablet at night   PREVIOUS MEDICATIONS: Sinemet (changed from IR due to EDS but not sure it really made a difference but we did not change back because of NOH);  northera (RX by cardiology but not taken)  ALLERGIES:   Allergies  Allergen Reactions   Sulfa Antibiotics Anaphylaxis   Sulfonamide Derivatives Anaphylaxis   Topamax [Topiramate] Other (See Comments)    Metabolic acidosis    Clarithromycin Other (See Comments)    pericarditis Other reaction(s): Not available   Hydrocodone Other (See Comments)    "hyper and climbing the walls"   Motrin [Ibuprofen] Other (See Comments)    headaches   Misc. Sulfonamide Containing Compounds     Other reaction(s): Not available   Symbicort [Budesonide-Formoterol Fumarate] Other (See Comments)    02/07/15 tremor   Tetracycline Hcl Other (See Comments)   Percocet [Oxycodone-Acetaminophen] Itching and Rash   Tape Itching and Rash    Use paper tape  only   Tetracyclines & Related Other (See Comments)    "immediate yeast infection"    CURRENT MEDICATIONS:  Outpatient Encounter Medications as of 08/01/2023  Medication Sig   acetaminophen (TYLENOL) 500 MG tablet Take 1,000 mg by mouth 3 (three) times daily.   albuterol (VENTOLIN HFA) 108 (90 Base) MCG/ACT inhaler Inhale 2 puffs into the lungs every 6 (six) hours as needed for wheezing or shortness of breath.   aspirin 81 MG tablet Take 1 tablet (81 mg total) by mouth daily.   calcium carbonate (TUMS - DOSED IN MG ELEMENTAL CALCIUM) 500 MG chewable tablet Chew 2 tablets by mouth daily as needed for indigestion or heartburn.   Carbidopa-Levodopa ER (SINEMET CR) 25-100 MG tablet controlled release TAKE TWO TABLET BY MOUTH AT NINE IN THE MORNING, ONE TAB AT ONE IN THE EVENING AND ONE TAB AT FIVE IN THE EVENING   Cholecalciferol (VITAMIN D3) 25 MCG (1000 UT) CAPS Take 1 capsule (1,000 Units total) by mouth daily.   clonazePAM (KLONOPIN) 0.5 MG tablet TAKE ONE HALF (1/2) A TABLET BY MOUTH AT BEDTIME   diphenhydrAMINE (BENADRYL) 25 MG tablet Take 50 mg by mouth in the morning.   EPINEPHrine 0.3 mg/0.3 mL IJ SOAJ injection Inject 0.3 mg into the muscle as needed for anaphylaxis.   FEROSUL 325 (65 Fe) MG tablet TAKE ONE TABLET BY MOUTH DAILY WITH BREAKFAST   Fluticasone-Umeclidin-Vilant (TRELEGY ELLIPTA) 100-62.5-25 MCG/ACT AEPB Inhale 1 puff into the lungs daily.   furosemide (LASIX) 40 MG tablet TAKE ONE TABLET BY MOUTH ONCE DAILY *NEW DOSE*   gabapentin ([name]) 300 MG capsule TAKE TWO CAPSULES BY MOUTH THREE TIMES DAILY   pantoprazole (PROTONIX) 20 MG tablet Take 1 tablet (20 mg total) by mouth daily.   potassium chloride SA (KLOR-CON M) 20 MEQ tablet TAKE ONE TABLET BY MOUTH ONCE A DAY   No facility-administered encounter medications on file as of 08/01/2023.    Objective:   PHYSICAL EXAMINATION:    VITALS:   There were no vitals filed for this visit.      GEN:  The patient appears  stated age and is in NAD. HEENT:  Normocephalic, atraumatic.  The mucous membranes are moist. The superficial temporal arteries are without ropiness or tenderness. CV:  brady.  Regular.  +3/6 sem Lungs:  CTAB.  There is some DOE Neck/HEME:  There are no carotid bruits bilaterally.  Neurological examination:  Orientation: The patient is alert and oriented x3. Cranial nerves: There is good facial symmetry with facial hypomimia. No ptosis today.  The speech is fluent and clear. Soft palate rises symmetrically and there is no tongue deviation. Hearing is intact to conversational tone. Sensation: Sensation is intact to light touch throughout Motor: Strength  is at least antigravity x4.  Movement examination: Tone: There is normal tone in the UE/LE Abnormal movements: Rare tremor of the left hand. Coordination:  There is mild decremation with finger taps on the right and heel/toe taps bilaterally Gait and Station: The patient has no difficulty arising out of a deep-seated chair without the use of the hands. The patient's stride length is good but she is cautious/tenuous with ambulation  I have reviewed and interpreted the following labs independently    Chemistry      Component Value Date/Time   NA 138 05/14/2023 1419   NA 140 04/30/2022 1249   NA 141 03/28/2017 1130   K 4.1 05/14/2023 1419   K 4.2 03/28/2017 1130   CL 98 05/14/2023 1419   CL 102 03/10/2013 1529   CO2 30 05/14/2023 1419   CO2 28 03/28/2017 1130   BUN 17 05/14/2023 1419   BUN 12 04/30/2022 1249   BUN 17.3 03/28/2017 1130   CREATININE 0.91 05/14/2023 1419   CREATININE 0.8 03/28/2017 1130      Component Value Date/Time   CALCIUM 9.6 05/14/2023 1419   CALCIUM 9.6 03/28/2017 1130   ALKPHOS 67 04/01/2023 0816   ALKPHOS 85 03/28/2017 1130   AST 20 04/01/2023 0816   AST 12 03/28/2017 1130   ALT <5 04/01/2023 0816   ALT <6 03/28/2017 1130   BILITOT 2.0 (H) 04/01/2023 0816   BILITOT 1.38 (H) 03/28/2017 1130        Bass Results  Component Value Date   WBC 4.4 04/04/2023   HGB 11.4 (L) 04/04/2023   HCT 35.0 (L) 04/04/2023   MCV 100.3 (H) 04/04/2023   PLT 123 (L) 04/04/2023    Bass Results  Component Value Date   TSH 0.84 03/23/2022     Total time spent on today's visit was *** minutes, including both face-to-face time and nonface-to-face time.  Time included that spent on review of records (prior notes available to me/labs/imaging if pertinent), discussing treatment and goals, answering patient's questions and coordinating care.  Cc:  Eustaquio Boyden, MD

## 2023-07-31 DIAGNOSIS — M6281 Muscle weakness (generalized): Secondary | ICD-10-CM | POA: Diagnosis not present

## 2023-07-31 DIAGNOSIS — R2681 Unsteadiness on feet: Secondary | ICD-10-CM | POA: Diagnosis not present

## 2023-07-31 DIAGNOSIS — I340 Nonrheumatic mitral (valve) insufficiency: Secondary | ICD-10-CM | POA: Diagnosis not present

## 2023-07-31 DIAGNOSIS — J4489 Other specified chronic obstructive pulmonary disease: Secondary | ICD-10-CM | POA: Diagnosis not present

## 2023-07-31 DIAGNOSIS — R4189 Other symptoms and signs involving cognitive functions and awareness: Secondary | ICD-10-CM | POA: Diagnosis not present

## 2023-07-31 DIAGNOSIS — R278 Other lack of coordination: Secondary | ICD-10-CM | POA: Diagnosis not present

## 2023-07-31 DIAGNOSIS — Z741 Need for assistance with personal care: Secondary | ICD-10-CM | POA: Diagnosis not present

## 2023-07-31 DIAGNOSIS — G20A1 Parkinson's disease without dyskinesia, without mention of fluctuations: Secondary | ICD-10-CM | POA: Diagnosis not present

## 2023-07-31 DIAGNOSIS — I5033 Acute on chronic diastolic (congestive) heart failure: Secondary | ICD-10-CM | POA: Diagnosis not present

## 2023-08-01 ENCOUNTER — Ambulatory Visit: Payer: Medicare PPO | Admitting: Neurology

## 2023-08-01 ENCOUNTER — Encounter: Payer: Self-pay | Admitting: Neurology

## 2023-08-01 VITALS — BP 118/72 | HR 67 | Ht 62.0 in | Wt 118.6 lb

## 2023-08-01 DIAGNOSIS — R0602 Shortness of breath: Secondary | ICD-10-CM | POA: Diagnosis not present

## 2023-08-01 DIAGNOSIS — G20A1 Parkinson's disease without dyskinesia, without mention of fluctuations: Secondary | ICD-10-CM

## 2023-08-01 NOTE — Patient Instructions (Signed)
Call your pulmonologist and let them know that you are more short of breath since you had COVID.  SAVE THE DATE!  We are planning a Parkinsons Disease educational symposium at [facility] in [place] on October 11.  More details to come!  If you would like to be added to our email list to get further information, email [email or web address].  To sign up, you can email [email or web address].  We hope to see you there!

## 2023-08-05 ENCOUNTER — Ambulatory Visit: Payer: Medicare PPO | Admitting: Internal Medicine

## 2023-08-05 ENCOUNTER — Encounter: Payer: Self-pay | Admitting: Internal Medicine

## 2023-08-05 VITALS — BP 120/72 | HR 73 | Temp 98.3°F | Ht 62.0 in | Wt 118.0 lb

## 2023-08-05 DIAGNOSIS — Z8616 Personal history of COVID-19: Secondary | ICD-10-CM

## 2023-08-05 DIAGNOSIS — R4189 Other symptoms and signs involving cognitive functions and awareness: Secondary | ICD-10-CM | POA: Diagnosis not present

## 2023-08-05 DIAGNOSIS — R54 Age-related physical debility: Secondary | ICD-10-CM

## 2023-08-05 DIAGNOSIS — Z7185 Encounter for immunization safety counseling: Secondary | ICD-10-CM

## 2023-08-05 DIAGNOSIS — R2681 Unsteadiness on feet: Secondary | ICD-10-CM | POA: Diagnosis not present

## 2023-08-05 DIAGNOSIS — Z8619 Personal history of other infectious and parasitic diseases: Secondary | ICD-10-CM | POA: Diagnosis not present

## 2023-08-05 DIAGNOSIS — G20A1 Parkinson's disease without dyskinesia, without mention of fluctuations: Secondary | ICD-10-CM | POA: Diagnosis not present

## 2023-08-05 DIAGNOSIS — I5033 Acute on chronic diastolic (congestive) heart failure: Secondary | ICD-10-CM | POA: Diagnosis not present

## 2023-08-05 DIAGNOSIS — R0609 Other forms of dyspnea: Secondary | ICD-10-CM

## 2023-08-05 DIAGNOSIS — R911 Solitary pulmonary nodule: Secondary | ICD-10-CM | POA: Diagnosis not present

## 2023-08-05 DIAGNOSIS — Z741 Need for assistance with personal care: Secondary | ICD-10-CM | POA: Diagnosis not present

## 2023-08-05 DIAGNOSIS — I340 Nonrheumatic mitral (valve) insufficiency: Secondary | ICD-10-CM | POA: Diagnosis not present

## 2023-08-05 DIAGNOSIS — J4489 Other specified chronic obstructive pulmonary disease: Secondary | ICD-10-CM | POA: Diagnosis not present

## 2023-08-05 DIAGNOSIS — Z86711 Personal history of pulmonary embolism: Secondary | ICD-10-CM

## 2023-08-05 DIAGNOSIS — R278 Other lack of coordination: Secondary | ICD-10-CM | POA: Diagnosis not present

## 2023-08-05 DIAGNOSIS — M6281 Muscle weakness (generalized): Secondary | ICD-10-CM | POA: Diagnosis not present

## 2023-08-05 LAB — CBC WITH DIFFERENTIAL/PLATELET
Basophils Absolute: 0.1 10*3/uL (ref 0.0–0.1)
Basophils Relative: 1 % (ref 0.0–3.0)
Eosinophils Absolute: 0.1 10*3/uL (ref 0.0–0.7)
Eosinophils Relative: 1.7 % (ref 0.0–5.0)
HCT: 40.8 % (ref 36.0–46.0)
Hemoglobin: 13 g/dL (ref 12.0–15.0)
Lymphocytes Relative: 8.8 % — ABNORMAL LOW (ref 12.0–46.0)
Lymphs Abs: 0.6 10*3/uL — ABNORMAL LOW (ref 0.7–4.0)
MCHC: 31.7 g/dL (ref 30.0–36.0)
MCV: 100.1 fl — ABNORMAL HIGH (ref 78.0–100.0)
Monocytes Absolute: 0.8 10*3/uL (ref 0.1–1.0)
Monocytes Relative: 11.9 % (ref 3.0–12.0)
Neutro Abs: 5.3 10*3/uL (ref 1.4–7.7)
Neutrophils Relative %: 76.6 % (ref 43.0–77.0)
Platelets: 328 10*3/uL (ref 150.0–400.0)
RBC: 4.08 Mil/uL (ref 3.87–5.11)
RDW: 14.9 % (ref 11.5–15.5)
WBC: 6.9 10*3/uL (ref 4.0–10.5)

## 2023-08-05 LAB — BASIC METABOLIC PANEL
BUN: 16 mg/dL (ref 6–23)
CO2: 31 meq/L (ref 19–32)
Calcium: 9.6 mg/dL (ref 8.4–10.5)
Chloride: 101 meq/L (ref 96–112)
Creatinine, Ser: 0.83 mg/dL (ref 0.40–1.20)
GFR: 66.59 mL/min (ref >60.00)
Glucose, Bld: 92 mg/dL (ref 70–99)
Potassium: 4.6 meq/L (ref 3.5–5.1)
Sodium: 139 meq/L (ref 135–145)

## 2023-08-05 LAB — D-DIMER, QUANTITATIVE: D-Dimer, Quant: 1.97 ug{FEU}/mL — ABNORMAL HIGH (ref <0.50)

## 2023-08-05 LAB — HEPATIC FUNCTION PANEL
ALT: 2 U/L (ref 0–35)
AST: 13 U/L (ref 0–37)
Albumin: 3.7 g/dL (ref 3.5–5.2)
Alkaline Phosphatase: 98 U/L (ref 39–117)
Bilirubin, Direct: 0.2 mg/dL (ref 0.0–0.3)
Total Bilirubin: 1 mg/dL (ref 0.2–1.2)
Total Protein: 7.6 g/dL (ref 6.0–8.3)

## 2023-08-05 LAB — BRAIN NATRIURETIC PEPTIDE: Pro B Natriuretic peptide (BNP): 484 pg/mL — ABNORMAL HIGH (ref 0.0–100.0)

## 2023-08-05 NOTE — Progress Notes (Signed)
02/09/2022: Crystal Bass with Crystal Ridges NP for surgical risk assessment.  She was seen in the ED on 02/03/2022 for chest pain and diagnosed with atypical pneumonia.  Had a CXR that showed right upper lobe airspace opacity and possible small apical thorax.  CT chest was negative for pneumothorax but did show diffuse tree-in-bud opacities concerning for pneumonia.  Will be finishing Augmentin tomorrow.  Doing well on Trelegy.  No signs of COPD exacerbation.  Advised that she would be high risk for surgery given age, COPD/asthma, [name] and recent pneumonia.  Plans for repeat CXR today which did not show any new opacities; persistent right upper lobe opacity in keeping with known history.  03/30/2022: OV with Cobb NP for follow-up.  She is somewhat unsure why she is here.  She was recommended by her cardiologist to come back as she has had progressive shortness of breath over the last 6 months.  She was found to have severe mitral valve regurgitation and underwent mitral valve clip in March.  Initially felt better and then began to have shortness of breath symptoms again.  Went back to cardiology and had a echocardiogram which showed moderate mitral valve regurgitation, worsened since echo after procedure, PFO and G2 DD.  She also reports that she has had some increased swelling in both of her lower legs.  She anticipated to see her primary cardiologist on Tuesday.  She denies any wheezing, cough, hemoptysis, recent weight loss, anorexia, night sweats, recent fevers.  Strong suspicion her symptoms are related to her worsening MR given she had the same symptoms prior to her MR clip. Recommended we could try switching from Trelegy to [facility] to see if she had any benefit from this. Walking oximetry was without desaturations; pt did not want to repeat PFTs. CXR was stable.   04/27/2022: Today - follow up   57-year-old female, never smoker followed for asthma and history of [name] infection.  She is patient Dr. Jane Canary last  seen in office 03/30/2022.  Past medical history significant for allergic rhinitis, hypertension, aortic aneurysm, PE, breast cancer, dysphagia, Parkinson's disease, severe mitral valve regurgitation status post clip.  Patient presents today for follow up. Overall, she feels relatively the same as when I saw her last. No perceived benefit from [place]; felt like Trelegy actually worked a little bit better. She continues to have DOE and lower extremity swelling. She was seen by Dr. Kirke Corin after I saw her on 5/9 who recommended she continue cardiac rehab; thought she may benefit from diuretics but she was not started on anything new at this visit. She has follow up scheduled with the structural heart clinic soon. She denies any cough, wheezing, hemoptysis, palpitations, fevers.    TEST/EVENTS:  01/13/2019 PFTs: FVC 75, FEV1 77, ratio 77, TLC 102, DLCOcor 95. Borderline BD with significant midflow reversibility.  02/03/2022 CT chest with contrast: Enlarged heart size.  No central or proximal segmental pulmonary embolus noted.  No LAD present.  There is a tiny hiatal hernia.  There are biapical mild centrilobular emphysematous changes.  Grossly similar appearing right upper lobe.  Mediastinum and apical postoperative changes with architectural distortion and volume loss.  Similar appearing lingular atelectasis.  No new focal consolidation.  There is a slightly more conspicuous right lower and middle lobe tree-in-bud nodularity.  Interval development of left lower lobe tree-in-bud nodularities.  Findings suggestive of atypical infection.  Stable 1.1 x 0.6 cm right lower lobe centrally calcified pulmonary nodule. 03/19/2022 echocardiogram: 1 month after  mitral valve clip.  EF 65 to 70%.  G2 DD.  LA severely dilated. Normal PASP.  PFO is present as seen by color Doppler with left-to-right flow.  MR is very eccentric; regurgitation moderate in severity; appears more prominent than previous echo after procedure. 03/30/2022  CXR 2 view: stable coarsened opacities in the RUL with volume loss, architectural distortion, and BTX overall unchanged when compared to prior. Mild coarsened interstitial markings   OV 08/05/2023  Subjective:  Patient ID: Crystal Bass, female , DOB: 04-08-1941 , age 82 y.o. , MRN: [id number] , ADDRESS: [id number] [place] [address] PCP Eustaquio Boyden, MD Patient Care Team: Eustaquio Boyden, MD as PCP - General (Family Medicine) Iran Ouch, MD as PCP - Cardiology (Cardiology) Sidney Ace, MD as Referring Physician (Allergy) Jerene Bears, MD as Consulting Physician (Gynecology) Tat, Octaviano Batty, DO as Consulting Physician (Neurology) Serena Croissant, MD as Consulting Physician (Hematology and Oncology) Tat, Octaviano Batty, DO as Consulting Physician (Neurology) Kathyrn Sheriff, [facility] (Inactive) as Pharmacist (Pharmacist)  This Provider for this visit: Treatment Team:  Attending Provider: Kalman Shan, MD    08/05/2023 -   Chief Complaint  Patient presents with   Follow-up    Increased SOB over the past 6-8 wks after having Covid 19. She asks when she can get another covid vaccine. She has had prod cough with dark brown sputum.      HPI Crystal Bass 82 y.o. -presents for follow-up.  I personally not seen her in 4 years.  In between she is seen the nurse practitioner.  There is a scheduled follow-up although it is more like an acute visit.  She says she had COVID 6 to 8 weeks ago.  Did not get hospitalized no oxygen use.  Did not take Paxlovid because of medication interaction.  I do not take any other antiviral.  She tells me now that since the COVID she has had worsening shortness of breath with exertion relieved by rest.  She was also having worsening cough but now it is better for the last 1 week and there is no sputum production there is no wheezing.  No fever no chills no nausea no vomiting no diarrhea.  She is extremely fatigued.  Pulse ox  today is 96% on room air at rest and heart rate of 68.  She is visibly dyspneic.  At baseline she does have some orthopnea and she raises her head and there is no change in this.  At baseline she has got some pedal edema for less than a year because of mitral valve clip surgeries and there is no change in this either.    CT Chest data from date: 12/27/203  - personally visualized and independently interpreted : no+ - my findings are: per the official report  IMPRESSION: No definite evidence of pulmonary embolus.   10 x 5 mm irregular density is noted in right lower lobe which is not significantly changed compared to prior exam. Neoplasm cannot be excluded. Follow-up unenhanced chest CT in 6-12 months is recommended.   Small to moderate size right pleural effusion is noted. Small left pleural effusion is noted. Mild bibasilar subsegmental atelectasis is noted.   Mosaic pattern is noted throughout both lungs which may represent multifocal inflammation or air trapping secondary to small airways disease.   Moderate cardiomegaly is noted with significant left atrial enlargement.   Small nonobstructive right renal calculus.   Aortic Atherosclerosis (ICD10-I70.0).  Electronically Signed   By: Lupita Raider M.D.   On: 11/21/2022 10:55  PFT     Latest Ref Rng & Units 01/13/2019   10:42 AM  ILD indicators  FVC-Pre L 1.81   FVC-Predicted Pre % 70   FVC-Post L 1.94   FVC-Predicted Post % 75   TLC L 4.89   TLC Predicted % 102   DLCO uncorrected ml/min/mmHg 17.28   DLCO UNC %Pred % 96   DLCO Corrected ml/min/mmHg 17.12   DLCO COR %Pred % 95       LAB RESULTS last 96 hours No results found.  LAB RESULTS last 90 days Recent Results (from the past 2160 hour(s))  VITAMIN D 25 Hydroxy (Vit-D Deficiency, Fractures)     Status: None   Collection Time: 05/14/23  2:19 PM  Result Value Ref Range   VITD 30.09 30.00 - 100.00 ng/mL  Basic Metabolic Panel     Status: Abnormal    Collection Time: 05/14/23  2:19 PM  Result Value Ref Range   Sodium 138 135 - 145 mEq/L   Potassium 4.1 3.5 - 5.1 mEq/L   Chloride 98 96 - 112 mEq/L   CO2 30 19 - 32 mEq/L   Glucose, Bld 79 70 - 99 mg/dL   BUN 17 6 - 23 mg/dL   Creatinine, Ser 1.47 0.40 - 1.20 mg/dL   GFR 82.95 (L) >62.13 mL/min    Comment: Calculated using the CKD-EPI Creatinine Equation (2021)   Calcium 9.6 8.4 - 10.5 mg/dL  ECHOCARDIOGRAM COMPLETE     Status: None   Collection Time: 05/15/23  9:38 AM  Result Value Ref Range   Area-P 1/2 1.36 cm2   S' Lateral 2.90 cm   MV M vel 4.98 m/s   MV Peak grad 99.3 mmHg   Est EF 60 - 65%          has a past medical history of Aneurysm of aorta (HCC), Anginal pain (HCC), Arthritis, Asthma, Breast cancer (HCC) (2002), Cataract (2019), Chronic heart failure with preserved ejection fraction (HFpEF) (HCC), COPD (chronic obstructive pulmonary disease) (HCC) (07/2007), Coronary artery disease, Ductal carcinoma of breast, estrogen receptor positive, stage 1 (HCC) (09/16/2012), Endometriosis, GERD (gastroesophageal reflux disease), mitral valve repair, Hypercalcemia (09/14/2013), Hypertension, Lesion of breast (1992), Lung disease, Mastalgia (05/1994), Mitral regurgitation, Moderate mitral regurgitation by prior echocardiogram, Osteoporosis, post-menopausal (03/14/2012), Ovarian tumor of borderline malignancy, right (2004), Palpitations, Pancreatitis, Parkinson disease (02/10/2015), Post-thoracotomy pain syndrome, Pseudomonas pneumonia (HCC) (03/2008), Status post implantation of mitral valve leaflet clip (02/15/2022), Traumatic closed fracture of distal clavicle with minimal displacement, left, initial encounter (06/2021), Uterine fibroid, and Vitamin D deficiency.   reports that she has never smoked. She has never used smokeless tobacco.  Past Surgical History:  Procedure Laterality Date   ABDOMINAL HYSTERECTOMY     & BSO for Mucinous borderline tumor of R ovary 2004    APPENDECTOMY  2004   BREAST BIOPSY  08/2004   right breast-benign   BREAST LUMPECTOMY  1992   benign   BUBBLE STUDY  12/27/2021   Procedure: BUBBLE STUDY;  Surgeon: Sande Rives, MD;  Location: [facility] ENDOSCOPY;  Service: Cardiovascular;;   CARDIAC CATHETERIZATION  2007   essentially negation for significant CAD   CATARACT EXTRACTION W/PHACO Left 07/24/2023   Procedure: CATARACT EXTRACTION PHACO AND INTRAOCULAR LENS PLACEMENT (IOC) LEFT 5.34 0037.3;  Surgeon: Lockie Mola, MD;  Location: [facility] SURGERY CNTR;  Service: Ophthalmology;  Laterality: Left;   CHOLECYSTECTOMY  2001  COLONOSCOPY  2014   Dr Marina Goodell , due 2019   COLONOSCOPY  09/2018   2 TA removed, int hem, no f/u needed (Dr Marina Goodell)    ESOPHAGOGASTRODUODENOSCOPY  09/2018   GERD with esophagitis and stricture dilated, antral gastritis negative for H pylori Marina Goodell)   HIP ARTHROPLASTY Left 08/21/2022   Procedure: ARTHROPLASTY BIPOLAR HIP (HEMIARTHROPLASTY);  Surgeon: Juanell Fairly, MD;  Location: [facility] ORS;  Service: Orthopedics;  Laterality: Left;   LUNG BIOPSY  2002   [name], Dr Edwyna Shell   MASTECTOMY MODIFIED RADICAL Left 2002   oral chemotheraphy (tamoxifen then Armidex) no radiation, Dr.[name]   MITRAL VALVE REPAIR N/A 02/15/2022   Procedure: MITRAL VALVE REPAIR;  Surgeon: Tonny Bollman, MD;  Location: [facility] INVASIVE CV LAB;  Service: Cardiovascular;  Laterality: N/A;   MITRAL VALVE REPAIR N/A 04/03/2023   Procedure: MITRAL VALVE REPAIR;  Surgeon: Tonny Bollman, MD;  Location: [facility] INVASIVE CV LAB;  Service: Cardiovascular;  Laterality: N/A;   RIGHT/LEFT HEART CATH AND CORONARY ANGIOGRAPHY Bilateral 11/14/2021   Procedure: RIGHT/LEFT HEART CATH AND CORONARY ANGIOGRAPHY;  Surgeon: Iran Ouch, MD;  Location: [facility] INVASIVE CV LAB;  Service: Cardiovascular;  Laterality: Bilateral;   TEE WITHOUT CARDIOVERSION N/A 12/29/2018   Procedure: TRANSESOPHAGEAL ECHOCARDIOGRAM (TEE);  Surgeon: Iran Ouch, MD;  Location: [facility]  ORS;  Service: Cardiovascular;  Laterality: N/A;   TEE WITHOUT CARDIOVERSION N/A 12/27/2021   Procedure: TRANSESOPHAGEAL ECHOCARDIOGRAM (TEE);  Surgeon: Sande Rives, MD;  Location: [facility] ENDOSCOPY;  Service: Cardiovascular;  Laterality: N/A;   TEE WITHOUT CARDIOVERSION N/A 02/15/2022   Procedure: TRANSESOPHAGEAL ECHOCARDIOGRAM (TEE);  Surgeon: Tonny Bollman, MD;  Location: [facility] INVASIVE CV LAB;  Service: Cardiovascular;  Laterality: N/A;   TEE WITHOUT CARDIOVERSION N/A 05/14/2022   Procedure: TRANSESOPHAGEAL ECHOCARDIOGRAM (TEE);  Surgeon: Sande Rives, MD;  Location: [facility] ENDOSCOPY;  Service: Cardiovascular;  Laterality: N/A;   TEE WITHOUT CARDIOVERSION N/A 04/03/2023   Procedure: TRANSESOPHAGEAL ECHOCARDIOGRAM;  Surgeon: Tonny Bollman, MD;  Location: [facility] INVASIVE CV LAB;  Service: Cardiovascular;  Laterality: N/A;   TONSILLECTOMY  1946    Allergies  Allergen Reactions   Sulfa Antibiotics Anaphylaxis   Sulfonamide Derivatives Anaphylaxis   Topamax [Topiramate] Other (See Comments)    Metabolic acidosis    Clarithromycin Other (See Comments)    pericarditis Other reaction(s): Not available   Hydrocodone Other (See Comments)    "hyper and climbing the walls"   Motrin [Ibuprofen] Other (See Comments)    headaches   Misc. Sulfonamide Containing Compounds     Other reaction(s): Not available   Symbicort [Budesonide-Formoterol Fumarate] Other (See Comments)    02/07/15 tremor   Tetracycline Hcl Other (See Comments)   Percocet [Oxycodone-Acetaminophen] Itching and Rash   Tape Itching and Rash    Use paper tape only   Tetracyclines & Related Other (See Comments)    "immediate yeast infection"    Immunization History  Administered Date(s) Administered   Covid-19, Mrna,Vaccine(Spikevax)38yrs and older 10/25/2022   Fluad Quad(high Dose 65+) 08/11/2019, 08/03/2020, 08/08/2021, 09/06/2022   Influenza Split 09/11/2011   Influenza Whole 09/09/2007, 08/08/2008, 08/15/2010,  08/26/2012   Influenza,inj,Quad PF,6+ Mos 08/18/2013, 08/20/2014, 08/25/2018   Influenza-Unspecified 09/27/2015, 08/26/2017   Moderna Sars-Covid-2 Vaccination 01/01/2020, 01/20/2020, 02/16/2020, 07/25/2020, 11/22/2020   PPD Test 08/24/2022   Pneumococcal Conjugate-13 02/12/2014   Pneumococcal Polysaccharide-23 11/26/2006, 10/21/2012, 11/22/2020, 11/21/2021   Rsv, Bivalent, Protein Subunit Rsvpref,pf Verdis Frederickson) 10/25/2022   Td 01/01/2011, 06/30/2021   Zoster Recombinant(Shingrix) 02/22/2018, 07/08/2019   Zoster, Live 05/26/2007    Family  History  Problem Relation Age of Onset   Emphysema Mother        d.64 was never a smoker   Lung cancer Father 46       d.82 history of smoking   Breast cancer Sister 38   Colon cancer Sister    Melanoma Maternal Aunt    Liver cancer Maternal Aunt    Cancer Maternal Uncle        unspecified type   Cancer Maternal Uncle        unspecified type   Cancer Maternal Uncle        unspecified type   Stroke Paternal Aunt        in mid 59s   Osteoporosis Paternal Aunt    Myasthenia gravis Paternal Aunt    Cancer Maternal Grandmother        d.82s unspecified GI cancer   Cancer Maternal Grandfather        d.62s unspecified type   Breast cancer Cousin        d.60s-daughter of unaffected paternal aunt Doristine Church   Colon cancer Cousin 60       d.70-daughter of unaffected maternal aunt Leila   Lung cancer Cousin 70       d.70-sisters to each other, both daughters of maternal uncle Johnny   Parkinson's disease Cousin    Diabetes Neg Hx    Heart disease Neg Hx    Stomach cancer Neg Hx    Ulcerative colitis Neg Hx      Current Outpatient Medications:    acetaminophen (TYLENOL) 500 MG tablet, Take 1,000 mg by mouth 3 (three) times daily., Disp: , Rfl:    albuterol (VENTOLIN HFA) 108 (90 Base) MCG/ACT inhaler, Inhale 2 puffs into the lungs every 6 (six) hours as needed for wheezing or shortness of breath., Disp: 18 g, Rfl: 1   aspirin 81 MG tablet, Take 1  tablet (81 mg total) by mouth daily., Disp: 30 tablet, Rfl:    calcium carbonate (TUMS - DOSED IN MG ELEMENTAL CALCIUM) 500 MG chewable tablet, Chew 2 tablets by mouth daily as needed for indigestion or heartburn., Disp: , Rfl:    Carbidopa-Levodopa ER (SINEMET CR) 25-100 MG tablet controlled release, TAKE TWO TABLET BY MOUTH AT NINE IN THE MORNING, ONE TAB AT ONE IN THE EVENING AND ONE TAB AT FIVE IN THE EVENING, Disp: 360 tablet, Rfl: 1   Cholecalciferol (VITAMIN D3) 25 MCG (1000 UT) CAPS, Take 1 capsule (1,000 Units total) by mouth daily., Disp: 30 capsule, Rfl:    clonazePAM (KLONOPIN) 0.5 MG tablet, TAKE ONE HALF (1/2) A TABLET BY MOUTH AT BEDTIME, Disp: 45 tablet, Rfl: 0   diphenhydrAMINE (BENADRYL) 25 MG tablet, Take 50 mg by mouth in the morning., Disp: , Rfl:    EPINEPHrine 0.3 mg/0.3 mL IJ SOAJ injection, Inject 0.3 mg into the muscle as needed for anaphylaxis., Disp: , Rfl:    FEROSUL 325 (65 Fe) MG tablet, TAKE ONE TABLET BY MOUTH DAILY WITH BREAKFAST, Disp: 90 tablet, Rfl: 1   Fluticasone-Umeclidin-Vilant (TRELEGY ELLIPTA) 100-62.5-25 MCG/ACT AEPB, Inhale 1 puff into the lungs daily., Disp: 1 each, Rfl: 6   furosemide (LASIX) 40 MG tablet, TAKE ONE TABLET BY MOUTH ONCE DAILY *NEW DOSE*, Disp: 90 tablet, Rfl: 1   gabapentin ([name]) 300 MG capsule, TAKE TWO CAPSULES BY MOUTH THREE TIMES DAILY, Disp: 540 capsule, Rfl: 1   pantoprazole (PROTONIX) 20 MG tablet, Take 1 tablet (20 mg total) by mouth daily., Disp: , Rfl:  potassium chloride SA (KLOR-CON M) 20 MEQ tablet, TAKE ONE TABLET BY MOUTH ONCE A DAY, Disp: 60 tablet, Rfl: 1      Objective:   Vitals:   08/05/23 1005  BP: 120/72  Pulse: 73  Temp: 98.3 F (36.8 C)  TempSrc: Oral  SpO2: 92%  Weight: 118 lb (53.5 kg)  Height: 5\' 2"  (1.575 m)    Estimated body mass index is 21.58 kg/m as calculated from the following:   Height as of this encounter: 5\' 2"  (1.575 m).   Weight as of this encounter: 118 lb (53.5  kg).  @WEIGHTCHANGE @  [employer]   08/05/23 1005  Weight: 118 lb (53.5 kg)     Physical Exam   General: No distress.  Visibly labored breathing slow O2 at rest: no Cane present: no Sitting in wheel chair: no Frail: YES. HAS WALKER Obese: no Neuro: Alert and Oriented x 3. GCS 15. Speech normal Psych: Pleasant Resp:  Barrel Chest - no.  Wheeze - no, Crackles - no, No overt respiratory distress CVS: Normal heart sounds. Murmurs - no Ext: Stigmata of Connective Tissue Disease - no HEENT: Normal upper airway. PEERL +. No post nasal drip        Assessment:       ICD-10-CM   1. DOE (dyspnea on exertion)  R06.09     2. History of [name] infection  Z86.19     3. History of pulmonary embolus (PE)  Z86.711     4. History of 2019 novel coronavirus disease (COVID-19)  Z86.16     5. Vaccine counseling  Z71.85     6. Frailty  R54     7. Right lower lobe pulmonary nodule  R91.1          Plan:     Patient Instructions  Dyspnea on exertion History of [name] infection History PE History of Covid  -Worsening shortness of breath could be COVID long-haul could be a cardiac issue of post-COVID -Need to rule out blood clot particularly given previous history of pulm embolism and breast cancer.  Plan - Check CBC with differential, chemistry, liver function test, BNP and D-dimer -CT scan of the chest without contrast  -Will change to PE protocol if the\ D-dimer are abnormal  Right lower lobe 10 mm nodule December 2023 on CT chest Small right pleural effusion December 2023 CT chest  Plan  - Captured in the above CT chest  Vaccine counseling  Plan - High-dose flu shot at [facility] - Can consider COVID-vaccine 90 days after your most recent COVID  Frailty  Plan  - Per PCP  Followup -We will inform you of the results - Return to see Dr. Marchelle Gearing in 3 months or sooner if needed   FOLLOWUP Return in about 3 months (around 11/04/2023) for 15 min visit, with Dr  Marchelle Gearing, Face to Face OR Video Visit.    SIGNATURE    Dr. Kalman Shan, M.D., F.C.C.P,  Pulmonary and Critical Care Medicine Staff Physician, [facility] Center Director - Interstitial Lung Disease  Program  Pulmonary Fibrosis [facility] [place], [place], [address]  Pager: [phone], If no answer or between  15:00h - 7:00h: call [phone] Telephone: [phone]  11:08 AM 08/05/2023

## 2023-08-05 NOTE — Patient Instructions (Addendum)
Dyspnea on exertion History of [name] infection History PE History of Covid  -Worsening shortness of breath could be COVID long-haul could be a cardiac issue of post-COVID -Need to rule out blood clot particularly given previous history of pulm embolism and breast cancer.  Plan - Check CBC with differential, chemistry, liver function test, BNP and D-dimer -CT scan of the chest without contrast  -Will change to PE protocol if the\ D-dimer are abnormal  Right lower lobe 10 mm nodule December 2023 on CT chest Small right pleural effusion December 2023 CT chest  Plan  - Captured in the above CT chest  Vaccine counseling  Plan - High-dose flu shot at [facility] - Can consider COVID-vaccine 90 days after your most recent COVID  Frailty  Plan  - Per PCP  Followup -We will inform you of the results - Return to see Dr. Marchelle Bass in 3 months or sooner if needed

## 2023-08-06 NOTE — Discharge Instructions (Signed)

## 2023-08-07 ENCOUNTER — Encounter: Payer: Self-pay | Admitting: Ophthalmology

## 2023-08-07 ENCOUNTER — Ambulatory Visit: Payer: Medicare PPO | Admitting: Anesthesiology

## 2023-08-07 ENCOUNTER — Ambulatory Visit
Admission: RE | Admit: 2023-08-07 | Discharge: 2023-08-07 | Disposition: A | Discharge status: Home / Self Care | Payer: Medicare PPO | Source: Ambulatory Visit | Attending: Ophthalmology | Admitting: Ophthalmology

## 2023-08-07 ENCOUNTER — Other Ambulatory Visit: Payer: Self-pay

## 2023-08-07 ENCOUNTER — Encounter
Admission: RE | Disposition: A | Discharge status: Home / Self Care | Payer: Self-pay | Source: Ambulatory Visit | Attending: Ophthalmology

## 2023-08-07 DIAGNOSIS — I110 Hypertensive heart disease with heart failure: Secondary | ICD-10-CM | POA: Diagnosis present

## 2023-08-07 DIAGNOSIS — E559 Vitamin D deficiency, unspecified: Secondary | ICD-10-CM | POA: Diagnosis present

## 2023-08-07 DIAGNOSIS — H269 Unspecified cataract: Secondary | ICD-10-CM | POA: Diagnosis not present

## 2023-08-07 DIAGNOSIS — Z801 Family history of malignant neoplasm of trachea, bronchus and lung: Secondary | ICD-10-CM | POA: Diagnosis not present

## 2023-08-07 DIAGNOSIS — J189 Pneumonia, unspecified organism: Secondary | ICD-10-CM | POA: Diagnosis present

## 2023-08-07 DIAGNOSIS — I080 Rheumatic disorders of both mitral and aortic valves: Secondary | ICD-10-CM | POA: Insufficient documentation

## 2023-08-07 DIAGNOSIS — Z8619 Personal history of other infectious and parasitic diseases: Secondary | ICD-10-CM | POA: Diagnosis not present

## 2023-08-07 DIAGNOSIS — J470 Bronchiectasis with acute lower respiratory infection: Secondary | ICD-10-CM | POA: Diagnosis present

## 2023-08-07 DIAGNOSIS — Z66 Do not resuscitate: Secondary | ICD-10-CM | POA: Diagnosis present

## 2023-08-07 DIAGNOSIS — J4520 Mild intermittent asthma, uncomplicated: Secondary | ICD-10-CM | POA: Diagnosis present

## 2023-08-07 DIAGNOSIS — R54 Age-related physical debility: Secondary | ICD-10-CM | POA: Diagnosis present

## 2023-08-07 DIAGNOSIS — R0609 Other forms of dyspnea: Secondary | ICD-10-CM | POA: Diagnosis not present

## 2023-08-07 DIAGNOSIS — M810 Age-related osteoporosis without current pathological fracture: Secondary | ICD-10-CM | POA: Insufficient documentation

## 2023-08-07 DIAGNOSIS — K219 Gastro-esophageal reflux disease without esophagitis: Secondary | ICD-10-CM | POA: Insufficient documentation

## 2023-08-07 DIAGNOSIS — Z86711 Personal history of pulmonary embolism: Secondary | ICD-10-CM | POA: Diagnosis not present

## 2023-08-07 DIAGNOSIS — M1990 Unspecified osteoarthritis, unspecified site: Secondary | ICD-10-CM | POA: Insufficient documentation

## 2023-08-07 DIAGNOSIS — J4489 Other specified chronic obstructive pulmonary disease: Secondary | ICD-10-CM | POA: Diagnosis not present

## 2023-08-07 DIAGNOSIS — I10 Essential (primary) hypertension: Secondary | ICD-10-CM | POA: Diagnosis not present

## 2023-08-07 DIAGNOSIS — G20A1 Parkinson's disease without dyskinesia, without mention of fluctuations: Secondary | ICD-10-CM | POA: Diagnosis present

## 2023-08-07 DIAGNOSIS — Z9012 Acquired absence of left breast and nipple: Secondary | ICD-10-CM | POA: Diagnosis not present

## 2023-08-07 DIAGNOSIS — I5032 Chronic diastolic (congestive) heart failure: Secondary | ICD-10-CM | POA: Diagnosis present

## 2023-08-07 DIAGNOSIS — Z8616 Personal history of COVID-19: Secondary | ICD-10-CM | POA: Diagnosis not present

## 2023-08-07 DIAGNOSIS — R918 Other nonspecific abnormal finding of lung field: Secondary | ICD-10-CM | POA: Diagnosis not present

## 2023-08-07 DIAGNOSIS — I712 Thoracic aortic aneurysm, without rupture, unspecified: Secondary | ICD-10-CM | POA: Diagnosis present

## 2023-08-07 DIAGNOSIS — J449 Chronic obstructive pulmonary disease, unspecified: Secondary | ICD-10-CM | POA: Insufficient documentation

## 2023-08-07 DIAGNOSIS — I2510 Atherosclerotic heart disease of native coronary artery without angina pectoris: Secondary | ICD-10-CM | POA: Diagnosis present

## 2023-08-07 DIAGNOSIS — H2511 Age-related nuclear cataract, right eye: Secondary | ICD-10-CM | POA: Diagnosis present

## 2023-08-07 DIAGNOSIS — I340 Nonrheumatic mitral (valve) insufficiency: Secondary | ICD-10-CM | POA: Diagnosis present

## 2023-08-07 DIAGNOSIS — J984 Other disorders of lung: Secondary | ICD-10-CM | POA: Diagnosis not present

## 2023-08-07 DIAGNOSIS — R911 Solitary pulmonary nodule: Secondary | ICD-10-CM | POA: Diagnosis not present

## 2023-08-07 DIAGNOSIS — Z853 Personal history of malignant neoplasm of breast: Secondary | ICD-10-CM | POA: Diagnosis not present

## 2023-08-07 DIAGNOSIS — I25119 Atherosclerotic heart disease of native coronary artery with unspecified angina pectoris: Secondary | ICD-10-CM | POA: Insufficient documentation

## 2023-08-07 DIAGNOSIS — Z800 Family history of malignant neoplasm of digestive organs: Secondary | ICD-10-CM | POA: Diagnosis not present

## 2023-08-07 DIAGNOSIS — Z7982 Long term (current) use of aspirin: Secondary | ICD-10-CM | POA: Diagnosis not present

## 2023-08-07 DIAGNOSIS — I77819 Aortic ectasia, unspecified site: Secondary | ICD-10-CM | POA: Diagnosis not present

## 2023-08-07 DIAGNOSIS — J440 Chronic obstructive pulmonary disease with acute lower respiratory infection: Secondary | ICD-10-CM | POA: Diagnosis present

## 2023-08-07 DIAGNOSIS — R7989 Other specified abnormal findings of blood chemistry: Secondary | ICD-10-CM | POA: Diagnosis present

## 2023-08-07 DIAGNOSIS — A310 Pulmonary mycobacterial infection: Secondary | ICD-10-CM | POA: Diagnosis not present

## 2023-08-07 DIAGNOSIS — Z803 Family history of malignant neoplasm of breast: Secondary | ICD-10-CM | POA: Diagnosis not present

## 2023-08-07 DIAGNOSIS — R0602 Shortness of breath: Secondary | ICD-10-CM | POA: Diagnosis not present

## 2023-08-07 HISTORY — PX: CATARACT EXTRACTION W/PHACO: SHX586

## 2023-08-07 SURGERY — PHACOEMULSIFICATION, CATARACT, WITH IOL INSERTION
Anesthesia: Monitor Anesthesia Care | Laterality: Right

## 2023-08-07 MED ORDER — LACTATED RINGERS IV SOLN: INTRAVENOUS | Status: DC

## 2023-08-07 MED ORDER — CEFUROXIME OPHTHALMIC INJECTION 1 MG/0.1 ML
INJECTION | OPHTHALMIC | Status: DC | PRN
  Administered 2023-08-07: 1 mg via INTRACAMERAL

## 2023-08-07 MED ORDER — SIGHTPATH DOSE#1 BSS IO SOLN
INTRAOCULAR | Status: DC | PRN
  Administered 2023-08-07: 2 mL

## 2023-08-07 MED ORDER — MIDAZOLAM HCL 2 MG/2ML IJ SOLN
INTRAMUSCULAR | Status: DC | PRN
  Administered 2023-08-07: 1 mg via INTRAVENOUS

## 2023-08-07 MED ORDER — SIGHTPATH DOSE#1 BSS IO SOLN
INTRAOCULAR | Status: DC | PRN
  Administered 2023-08-07: 50 mL via OPHTHALMIC

## 2023-08-07 MED ORDER — SIGHTPATH DOSE#1 BSS IO SOLN
INTRAOCULAR | Status: DC | PRN
  Administered 2023-08-07: 15 mL via INTRAOCULAR

## 2023-08-07 MED ORDER — TETRACAINE HCL 0.5 % OP SOLN
1.0000 [drp] | OPHTHALMIC | Status: DC | PRN
  Administered 2023-08-07 (×3): 1 [drp] via OPHTHALMIC

## 2023-08-07 MED ORDER — SIGHTPATH DOSE#1 NA HYALUR & NA CHOND-NA HYALUR IO KIT
PACK | INTRAOCULAR | Status: DC | PRN
  Administered 2023-08-07: 1 via OPHTHALMIC

## 2023-08-07 MED ORDER — BRIMONIDINE TARTRATE-TIMOLOL 0.2-0.5 % OP SOLN
OPHTHALMIC | Status: DC | PRN
  Administered 2023-08-07: 1 [drp] via OPHTHALMIC

## 2023-08-07 MED ORDER — ARMC OPHTHALMIC DILATING DROPS
1.0000 | OPHTHALMIC | Status: DC | PRN
  Administered 2023-08-07 (×3): 1 via OPHTHALMIC

## 2023-08-07 SURGICAL SUPPLY — 10 items
CATARACT SUITE SIGHTPATH (MISCELLANEOUS) ×1 IMPLANT
FEE CATARACT SUITE SIGHTPATH (MISCELLANEOUS) ×2 IMPLANT
GLOVE SRG 8 PF TXTR STRL LF DI (GLOVE) ×2 IMPLANT
GLOVE SURG ENC TEXT LTX SZ7.5 (GLOVE) ×2 IMPLANT
GLOVE SURG UNDER POLY LF SZ8 (GLOVE) ×1
LENS IOL TECNIS EYHANCE 18.5 (Intraocular Lens) IMPLANT
NDL FILTER BLUNT 18X1 1/2 (NEEDLE) ×2 IMPLANT
NEEDLE FILTER BLUNT 18X1 1/2 (NEEDLE) ×1 IMPLANT
SYR 3ML LL SCALE MARK (SYRINGE) ×2 IMPLANT
WICK EYE OCUCEL (MISCELLANEOUS) IMPLANT

## 2023-08-07 NOTE — Anesthesia Preprocedure Evaluation (Signed)
Anesthesia Evaluation  Patient identified by MRN, date of birth, ID band Patient awake    Reviewed: Allergy & Precautions, H&P , NPO status , Patient's Chart, lab work & pertinent test results  Airway Mallampati: III  TM Distance: >3 FB Neck ROM: Full    Dental no notable dental hx. (+) Caps   Pulmonary asthma , pneumonia, COPD   Pulmonary exam normal breath sounds clear to auscultation       Cardiovascular hypertension, + angina  + CAD and +CHF  Normal cardiovascular exam+ Valvular Problems/Murmurs MR  Rhythm:Regular Rate:Normal  04-03-23 Successful TEER of the mitral valve with a MitraClip G4 XTW device, positioned A2/P2, lateral to the indwelling Clip devices   MR reduced from 4+ to 2+ post-procedure   05-15-23 1. Left ventricular ejection fraction, by estimation, is 60 to 65%. The  left ventricle has normal function. The left ventricle has no regional  wall motion abnormalities. There is mild left ventricular hypertrophy.  Left ventricular diastolic parameters  are indeterminate.   2. Right ventricular systolic function is normal. The right ventricular  size is mildly enlarged. There is mildly elevated pulmonary artery  systolic pressure. The estimated right ventricular systolic pressure is  38.5 mmHg.   3. The mitral valve has been repaired/replaced. There is a Mitra-Clip  present in the mitral position. The average mean gradient is at HR  56bpm. There is moderate mitral regurgitation that appears slightly more  significant compared to prior TTE  best appreciated on SAX views.   4. Left atrial size was severely dilated.   5. An iatrogenic ASD is present with left to right shunting. Evidence of  atrial level shunting detected by color flow Doppler.   6. Right atrial size was moderately dilated.   7. The aortic valve is tricuspid. There is mild calcification of the  aortic valve. There is mild thickening of the  aortic valve. Aortic valve  regurgitation is not visualized. Aortic valve sclerosis/calcification is  present, without any evidence of  aortic stenosis.   8. Aortic dilatation noted. There is mild dilatation of the ascending  aorta, measuring 41 mm.   9. The inferior vena cava is normal in size with greater than 50%  respiratory variability, suggesting right atrial pressure of 3 mmHg.   Now has moderate MR    Neuro/Psych Parkinson's disease  Neuromuscular disease negative neurological ROS  negative psych ROS   GI/Hepatic Neg liver ROS,GERD  ,,  Endo/Other  negative endocrine ROS    Renal/GU negative Renal ROS  negative genitourinary   Musculoskeletal  (+) Arthritis ,    Abdominal   Peds negative pediatric ROS (+)  Hematology negative hematology ROS (+)   Anesthesia Other Findings Pancreatitis Post-thoracotomy pain syndrome Endometriosis  Aneurysm of aorta (HCC) Osteoporosis, post-menopausal  Ductal carcinoma of breast, estrogen receptor positive, stage 1 (HCC) Pseudomonas pneumonia (HCC)  COPD (chronic obstructive pulmonary disease)  Mastalgia  Hypercalcemia  Vitamin D deficiency Parkinson disease  Breast cancer (HCC) Hypertension  Arthritis Asthma  GERD (gastroesophageal reflux disease) Cataract  Anginal pain (HCC) Status post implantation of mitral valve leaflet clip  Chronic heart failure with preserved ejection fraction (HFpEF) (HCC) Mitral regurgitation  Palpitations Coronary artery disease     Reproductive/Obstetrics negative OB ROS                             Anesthesia Physical Anesthesia Plan  ASA: 4  Anesthesia Plan: MAC  Post-op Pain Management:    Induction: Intravenous  PONV Risk Score and Plan: 2 and Midazolam and Treatment may vary due to age or medical condition  Airway Management Planned: Natural Airway and Nasal Cannula  Additional Equipment:   Intra-op Plan:   Post-operative Plan:    Informed Consent: I have reviewed the patients History and Physical, chart, labs and discussed the procedure including the risks, benefits and alternatives for the proposed anesthesia with the patient or authorized representative who has indicated his/her understanding and acceptance.     Dental Advisory Given  Plan Discussed with: Anesthesiologist, CRNA and Surgeon  Anesthesia Plan Comments: (Patient consented for risks of anesthesia including but not limited to:  - adverse reactions to medications - damage to eyes, teeth, lips or other oral mucosa - nerve damage due to positioning  - sore throat or hoarseness - Damage to heart, brain, nerves, lungs, other parts of body or loss of life  Patient voiced understanding.)        Anesthesia Quick Evaluation

## 2023-08-07 NOTE — Transfer of Care (Signed)
Immediate Anesthesia Transfer of Care Note  Patient: Crystal Bass  Procedure(s) Performed: CATARACT EXTRACTION PHACO AND INTRAOCULAR LENS PLACEMENT (IOC) RIGHT 7.83 00:47.9 (Right)  Patient Location: PACU  Anesthesia Type: MAC  Level of Consciousness: awake, alert  and patient cooperative  Airway and Oxygen Therapy: Patient Spontanous Breathing and Patient connected to supplemental oxygen  Post-op Assessment: Post-op Vital signs reviewed, Patient's Cardiovascular Status Stable, Respiratory Function Stable, Patent Airway and No signs of Nausea or vomiting  Post-op Vital Signs: Reviewed and stable  Complications: No notable events documented.

## 2023-08-07 NOTE — Op Note (Signed)
LOCATION:  [facility]   PREOPERATIVE DIAGNOSIS:    Nuclear sclerotic cataract right eye. H25.11   POSTOPERATIVE DIAGNOSIS:  Nuclear sclerotic cataract right eye.     PROCEDURE:  Phacoemusification with posterior chamber intraocular lens placement of the right eye   ULTRASOUND TIME: Procedure(s): CATARACT EXTRACTION PHACO AND INTRAOCULAR LENS PLACEMENT (IOC) RIGHT 7.83 00:47.9 (Right)  LENS:   Implant Name Type Inv. Item Serial No. Manufacturer Lot No. LRB No. Used Action  LENS IOL TECNIS EYHANCE 18.5 - [id number] Intraocular Lens LENS IOL TECNIS EYHANCE 18.5 [id number] [employer]  Right 1 Implanted         SURGEON:  Deirdre Evener, MD   ANESTHESIA:  Topical with tetracaine drops and 2% Xylocaine jelly, augmented with 1% preservative-free intracameral lidocaine.    COMPLICATIONS:  None.   DESCRIPTION OF PROCEDURE:  The patient was identified in the holding room and transported to the operating room and placed in the supine position under the operating microscope.  The right eye was identified as the operative eye and it was prepped and draped in the usual sterile ophthalmic fashion.   A 1 millimeter clear-corneal paracentesis was made at the 12:00 position.  0.5 ml of preservative-free 1% lidocaine was injected into the anterior chamber. The anterior chamber was filled with Viscoat viscoelastic.  A 2.4 millimeter keratome was used to make a near-clear corneal incision at the 9:00 position.  A curvilinear capsulorrhexis was made with a cystotome and capsulorrhexis forceps.  Balanced salt solution was used to hydrodissect and hydrodelineate the nucleus.   Phacoemulsification was then used in stop and chop fashion to remove the lens nucleus and epinucleus.  The remaining cortex was then removed using the irrigation and aspiration handpiece. Provisc was then placed into the capsular bag to distend it for lens placement.  A lens was then injected into the capsular bag.  The  remaining viscoelastic was aspirated.   Wounds were hydrated with balanced salt solution.  The anterior chamber was inflated to a physiologic pressure with balanced salt solution.  No wound leaks were noted. Cefuroxime 0.1 ml of a 10mg /ml solution was injected into the anterior chamber for a dose of 1 mg of intracameral antibiotic at the completion of the case.   Timolol and Brimonidine drops were applied to the eye.  The patient was taken to the recovery room in stable condition without complications of anesthesia or surgery.   Jezabel Lecker 08/07/2023, 10:39 AM

## 2023-08-07 NOTE — Anesthesia Postprocedure Evaluation (Signed)
Anesthesia Post Note  Patient: Crystal Bass  Procedure(s) Performed: CATARACT EXTRACTION PHACO AND INTRAOCULAR LENS PLACEMENT (IOC) RIGHT 7.83 00:47.9 (Right)  Patient location during evaluation: PACU Anesthesia Type: MAC Level of consciousness: awake and alert Pain management: pain level controlled Vital Signs Assessment: post-procedure vital signs reviewed and stable Respiratory status: spontaneous breathing, nonlabored ventilation, respiratory function stable and patient connected to nasal cannula oxygen Cardiovascular status: stable and blood pressure returned to baseline Postop Assessment: no apparent nausea or vomiting Anesthetic complications: no   No notable events documented.   Last Vitals:  Vitals:   08/07/23 1043 08/07/23 1046  BP:  130/81  Pulse: 68   Resp: 19   Temp: 36.5 C   SpO2: 98% 98%    Last Pain:  Vitals:   08/07/23 1046  TempSrc:   PainSc: 0-No pain                 Lenard Simmer

## 2023-08-07 NOTE — H&P (Signed)
[facility]   Primary Care Physician:  Eustaquio Boyden, MD Ophthalmologist: Dr. Lockie Mola  Pre-Procedure History & Physical: HPI:  Crystal Bass is a 81 y.o. female here for ophthalmic surgery.   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 10/2021 Cath: Nl cors.   Arthritis    Asthma    Breast cancer (HCC) 2002   infiltrative ductal carcinoma    Cataract 2019   Chronic heart failure with preserved ejection fraction (HFpEF) (HCC)    COPD (chronic obstructive pulmonary disease) (HCC) 07/2007   Coronary artery disease    Ductal carcinoma of breast, estrogen receptor positive, stage 1 (HCC) 09/16/2012   Endometriosis    GERD (gastroesophageal reflux disease)    Hx of mitral valve repair    Hypercalcemia 09/14/2013   Hypertension    Lesion of breast 1992   right, benign   Lung disease    secondary to [name] infection   Mastalgia 05/1994   Mitral regurgitation    a. 10/2021 RHC: PCWP V wave of 34; b. 01/2022 TEE: Severe flail/prolapse of large area of A2 scallop with severe posteriorly directed MR; c. 01/2022 TEER w /Mitraclip XTW x 2; d. 07/2022 Echo: EF 60-65%. No rwma, mild LVH, GrIII DD. Sev dil LA. Mod elev PASP. Mod-Sev MR. Mod MS.   Moderate mitral regurgitation by prior echocardiogram    Osteoporosis, post-menopausal 03/14/2012   Ovarian tumor of borderline malignancy, right 2004   Palpitations    a. 02/2022 Zio: Predominantly sinus rhythm at 67 (52-171).  2 runs of NSVT -fastest 9 beats at 167.  35 SVT runs-longest 19 beats, fastest 171x6 beats.   Pancreatitis    secondary to Cholelithiasis   Parkinson disease 02/10/2015   Post-thoracotomy pain syndrome    Pseudomonas pneumonia (HCC) 03/2008   Status post implantation of mitral valve leaflet clip 02/15/2022   s/p XTW MitraClip x2 by Dr. Excell Seltzer in the A2/P2: b. 04/03/23 s/p third mitraclip implantation.   Traumatic  closed fracture of distal clavicle with minimal displacement, left, initial encounter 06/2021   [facility]   Uterine fibroid    Vitamin D deficiency     Past Surgical History:  Procedure Laterality Date   ABDOMINAL HYSTERECTOMY     & BSO for Mucinous borderline tumor of R ovary 2004   APPENDECTOMY  2004   BREAST BIOPSY  08/2004   right breast-benign   BREAST LUMPECTOMY  1992   benign   BUBBLE STUDY  12/27/2021   Procedure: BUBBLE STUDY;  Surgeon: Sande Rives, MD;  Location[facility] ENDOSCOPY;  Service: Cardiovascular;;   CARDIAC CATHETERIZATION  2007   essentially negation for significant CAD   CATARACT EXTRACTION W/PHACO Left 07/24/2023   Procedure: CATARACT EXTRACTION PHACO AND INTRAOCULAR LENS PLACEMENT (IOC) LEFT 5.34 0037.3;  Surgeon: Lockie Mola, MD;  Location: [facility] SURGERY CNTR;  Service: Ophthalmology;  Laterality: Left;   CHOLECYSTECTOMY  2001   COLONOSCOPY  2014   Dr Marina Goodell , due 2019   COLONOSCOPY  09/2018   2 TA removed, int hem, no f/u needed (Dr Marina Goodell)    ESOPHAGOGASTRODUODENOSCOPY  09/2018   GERD with esophagitis and stricture dilated, antral gastritis negative for H pylori Marina Goodell)   HIP ARTHROPLASTY Left 08/21/2022   Procedure: ARTHROPLASTY BIPOLAR HIP (HEMIARTHROPLASTY);  Surgeon: Juanell Fairly, MD;  Location: [facility] ORS;  Service: Orthopedics;  Laterality: Left;  LUNG BIOPSY  2002   [name], Dr Edwyna Shell   MASTECTOMY MODIFIED RADICAL Left 2002   oral chemotheraphy (tamoxifen then Armidex) no radiation, Dr.[name]   MITRAL VALVE REPAIR N/A 02/15/2022   Procedure: MITRAL VALVE REPAIR;  Surgeon: Tonny Bollman, MD;  Location: [facility] INVASIVE CV LAB;  Service: Cardiovascular;  Laterality: N/A;   MITRAL VALVE REPAIR N/A 04/03/2023   Procedure: MITRAL VALVE REPAIR;  Surgeon: Tonny Bollman, MD;  Location: [facility] INVASIVE CV LAB;  Service: Cardiovascular;  Laterality: N/A;   RIGHT/LEFT HEART CATH AND CORONARY ANGIOGRAPHY Bilateral 11/14/2021   Procedure:  RIGHT/LEFT HEART CATH AND CORONARY ANGIOGRAPHY;  Surgeon: Iran Ouch, MD;  Location: [facility] INVASIVE CV LAB;  Service: Cardiovascular;  Laterality: Bilateral;   TEE WITHOUT CARDIOVERSION N/A 12/29/2018   Procedure: TRANSESOPHAGEAL ECHOCARDIOGRAM (TEE);  Surgeon: Iran Ouch, MD;  Location: [facility] ORS;  Service: Cardiovascular;  Laterality: N/A;   TEE WITHOUT CARDIOVERSION N/A 12/27/2021   Procedure: TRANSESOPHAGEAL ECHOCARDIOGRAM (TEE);  Surgeon: Sande Rives, MD;  Location: [facility] ENDOSCOPY;  Service: Cardiovascular;  Laterality: N/A;   TEE WITHOUT CARDIOVERSION N/A 02/15/2022   Procedure: TRANSESOPHAGEAL ECHOCARDIOGRAM (TEE);  Surgeon: Tonny Bollman, MD;  Location: [facility] INVASIVE CV LAB;  Service: Cardiovascular;  Laterality: N/A;   TEE WITHOUT CARDIOVERSION N/A 05/14/2022   Procedure: TRANSESOPHAGEAL ECHOCARDIOGRAM (TEE);  Surgeon: Sande Rives, MD;  Location: [facility] ENDOSCOPY;  Service: Cardiovascular;  Laterality: N/A;   TEE WITHOUT CARDIOVERSION N/A 04/03/2023   Procedure: TRANSESOPHAGEAL ECHOCARDIOGRAM;  Surgeon: Tonny Bollman, MD;  Location: [facility] INVASIVE CV LAB;  Service: Cardiovascular;  Laterality: N/A;   TONSILLECTOMY  1946    Prior to Admission medications   Medication Sig Start Date End Date Taking? Authorizing Provider  albuterol (VENTOLIN HFA) 108 (90 Base) MCG/ACT inhaler Inhale 2 puffs into the lungs every 6 (six) hours as needed for wheezing or shortness of breath. 06/04/22  Yes Eustaquio Boyden, MD  aspirin 81 MG tablet Take 1 tablet (81 mg total) by mouth daily. 01/27/19  Yes Eustaquio Boyden, MD  calcium carbonate (TUMS - DOSED IN MG ELEMENTAL CALCIUM) 500 MG chewable tablet Chew 2 tablets by mouth daily as needed for indigestion or heartburn.   Yes [provider]  Carbidopa-Levodopa ER (SINEMET CR) 25-100 MG tablet controlled release TAKE TWO TABLET BY MOUTH AT NINE IN THE MORNING, ONE TAB AT ONE IN THE EVENING AND ONE TAB AT FIVE IN THE EVENING 02/06/23   Yes Tat, Octaviano Batty, DO  Cholecalciferol (VITAMIN D3) 25 MCG (1000 UT) CAPS Take 1 capsule (1,000 Units total) by mouth daily. 09/27/22  Yes Eustaquio Boyden, MD  clonazePAM (KLONOPIN) 0.5 MG tablet TAKE ONE HALF (1/2) A TABLET BY MOUTH AT BEDTIME 05/09/23  Yes Hill, Manus Gunning, MD  diphenhydrAMINE (BENADRYL) 25 MG tablet Take 50 mg by mouth in the morning.   Yes [provider]  FEROSUL 325 (65 Fe) MG tablet TAKE ONE TABLET BY MOUTH DAILY WITH BREAKFAST 02/07/23  Yes Eustaquio Boyden, MD  Fluticasone-Umeclidin-Vilant (TRELEGY ELLIPTA) 100-62.5-25 MCG/ACT AEPB Inhale 1 puff into the lungs daily. 04/27/22  Yes Cobb, Ruby Cola, NP  furosemide (LASIX) 40 MG tablet TAKE ONE TABLET BY MOUTH ONCE DAILY *NEW DOSE* 03/29/23  Yes Iran Ouch, MD  gabapentin ([name]) 300 MG capsule TAKE TWO CAPSULES BY MOUTH THREE TIMES DAILY 02/07/23  Yes Eustaquio Boyden, MD  pantoprazole (PROTONIX) 20 MG tablet Take 1 tablet (20 mg total) by mouth daily. 04/01/23  Yes Eustaquio Boyden, MD  potassium chloride SA (KLOR-CON M) 20  MEQ tablet TAKE ONE TABLET BY MOUTH ONCE A DAY 01/30/23  Yes Iran Ouch, MD  acetaminophen (TYLENOL) 500 MG tablet Take 1,000 mg by mouth 3 (three) times daily.    [provider]  EPINEPHrine 0.3 mg/0.3 mL IJ SOAJ injection Inject 0.3 mg into the muscle as needed for anaphylaxis. 02/25/20   [provider]    Allergies as of 06/05/2023 - Review Complete 05/15/2023  Allergen Reaction Noted   Sulfa antibiotics Anaphylaxis 10/19/2020   Sulfonamide derivatives Anaphylaxis    Topamax [topiramate] Other (See Comments) 04/27/2013   Clarithromycin Other (See Comments) 04/13/2013   Hydrocodone Other (See Comments) 03/13/2011   Motrin [ibuprofen] Other (See Comments) 04/13/2013   Misc. sulfonamide containing compounds  08/19/2022   Symbicort [budesonide-formoterol fumarate] Other (See Comments) 02/07/2015   Tetracycline hcl Other (See Comments) 10/19/2020   Percocet  [oxycodone-acetaminophen] Itching and Rash 04/13/2013   Tape Itching and Rash 02/17/2013   Tetracyclines & related Other (See Comments) 03/13/2011    Family History  Problem Relation Age of Onset   Emphysema Mother        d.64 was never a smoker   Lung cancer Father 43       d.82 history of smoking   Breast cancer Sister 25   Colon cancer Sister    Melanoma Maternal Aunt    Liver cancer Maternal Aunt    Cancer Maternal Uncle        unspecified type   Cancer Maternal Uncle        unspecified type   Cancer Maternal Uncle        unspecified type   Stroke Paternal Aunt        in mid 40s   Osteoporosis Paternal Aunt    Myasthenia gravis Paternal Aunt    Cancer Maternal Grandmother        d.82s unspecified GI cancer   Cancer Maternal Grandfather        d.62s unspecified type   Breast cancer Cousin        d.60s-daughter of unaffected paternal aunt Doristine Church   Colon cancer Cousin 60       d.70-daughter of unaffected maternal aunt Leila   Lung cancer Cousin 70       d.70-sisters to each other, both daughters of maternal uncle Johnny   Parkinson's disease Cousin    Diabetes Neg Hx    Heart disease Neg Hx    Stomach cancer Neg Hx    Ulcerative colitis Neg Hx     Social History   Socioeconomic History   Marital status: Widowed    Spouse name: Not on file   Number of children: 0   Years of education: Not on file   Highest education level: Not on file  Occupational History   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, 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, 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, 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  Tobacco Use   Smoking status: Never   Smokeless tobacco: Never  Vaping Use   Vaping status: Never Used  Substance and Sexual Activity   Alcohol use: No    Alcohol/week: 0.0 standard drinks of alcohol   Drug use: No   Sexual activity: Not Currently    Birth control/protection: Surgical    Comment: TAH/BSO  Other Topics Concern   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    Aunt of Dr Gilmore Laroche    Lives  independent living at [facility]    No children    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,  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, 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, 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)    Activity: walking about 1 mile/day, enjoys [employer]    Diet: some water, fruits/vegetables some    Right handed    Social Determinants of Health   Financial Resource Strain: Low Risk  (08/13/2022)   Overall Financial Resource Strain (CARDIA)    Difficulty of Paying Living Expenses: Not hard at all  Food Insecurity: No Food Insecurity (11/28/2022)   Hunger Vital Sign    Worried About Running Out of Food in the Last Year: Never true    Ran Out of Food in the Last Year: Never true  Transportation Needs: No Transportation Needs (11/28/2022)   PRAPARE - Administrator, Civil Service (Medical): No    Lack of Transportation (Non-Medical): No  Recent Concern: Transportation Needs - Unmet Transportation Needs (11/22/2022)   PRAPARE - Transportation    Lack of Transportation (Medical): No    Lack of Transportation (Non-Medical): Yes  Physical Activity: Insufficiently Active (08/13/2022)   Exercise Vital Sign    Days of Exercise per Week: 4 days    Minutes of Exercise per Session: 30 min  Stress: No Stress Concern Present (08/13/2022)   [employer] of Occupational Health - Occupational Stress Questionnaire    Feeling of Stress : Not at all  Social Connections: Socially Isolated (08/13/2022)   Social Connection and Isolation Panel [NHANES]    Frequency of Communication with Friends and Family: More than three times a week    Frequency of Social Gatherings with Friends and Family: Once a week    Attends Religious Services: Never    Database 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 or Organizations: No    Attends Banker Meetings: Never    Marital Status: Widowed  Intimate Partner Violence: Not At Risk (11/22/2022)   Humiliation, Afraid, Rape, and Kick questionnaire    Fear of Current or Ex-Partner: No    Emotionally Abused: No    Physically Abused: No    Sexually Abused:  No    Review of Systems: See HPI, otherwise negative ROS  Physical Exam: BP 130/81   Temp 98.4 F (36.9 C) (Temporal)   Resp (!) 21   Ht 5\' 2"  (1.575 m)   Wt 53.5 kg   SpO2 94%   BMI 21.56 kg/m  General:   Alert,  pleasant and cooperative in NAD Head:  Normocephalic and atraumatic. Lungs:  Clear to auscultation.    Heart:  Regular rate and rhythm.   Impression/Plan: Crystal Bass is here for ophthalmic surgery.  Risks, benefits, limitations, and alternatives regarding ophthalmic surgery have been reviewed with the patient.  Questions have been answered.  All parties agreeable.   Lockie Mola, MD  08/07/2023, 9:52 AM

## 2023-08-08 ENCOUNTER — Emergency Department: Payer: Medicare PPO

## 2023-08-08 ENCOUNTER — Inpatient Hospital Stay
Admission: EM | Admit: 2023-08-08 | Discharge: 2023-08-10 | DRG: 988 | Disposition: A | Discharge status: Home / Self Care | Payer: Medicare PPO | Attending: Internal Medicine | Admitting: Internal Medicine

## 2023-08-08 ENCOUNTER — Telehealth: Payer: Self-pay | Admitting: Internal Medicine

## 2023-08-08 ENCOUNTER — Other Ambulatory Visit: Payer: Self-pay

## 2023-08-08 DIAGNOSIS — Z9889 Other specified postprocedural states: Secondary | ICD-10-CM

## 2023-08-08 DIAGNOSIS — Z853 Personal history of malignant neoplasm of breast: Secondary | ICD-10-CM

## 2023-08-08 DIAGNOSIS — Z885 Allergy status to narcotic agent status: Secondary | ICD-10-CM

## 2023-08-08 DIAGNOSIS — R918 Other nonspecific abnormal finding of lung field: Secondary | ICD-10-CM | POA: Diagnosis not present

## 2023-08-08 DIAGNOSIS — Z8262 Family history of osteoporosis: Secondary | ICD-10-CM

## 2023-08-08 DIAGNOSIS — Z823 Family history of stroke: Secondary | ICD-10-CM

## 2023-08-08 DIAGNOSIS — Z881 Allergy status to other antibiotic agents status: Secondary | ICD-10-CM

## 2023-08-08 DIAGNOSIS — I77819 Aortic ectasia, unspecified site: Secondary | ICD-10-CM | POA: Diagnosis not present

## 2023-08-08 DIAGNOSIS — R7989 Other specified abnormal findings of blood chemistry: Secondary | ICD-10-CM

## 2023-08-08 DIAGNOSIS — M810 Age-related osteoporosis without current pathological fracture: Secondary | ICD-10-CM | POA: Diagnosis present

## 2023-08-08 DIAGNOSIS — Z79899 Other long term (current) drug therapy: Secondary | ICD-10-CM

## 2023-08-08 DIAGNOSIS — Z800 Family history of malignant neoplasm of digestive organs: Secondary | ICD-10-CM

## 2023-08-08 DIAGNOSIS — Z9012 Acquired absence of left breast and nipple: Secondary | ICD-10-CM

## 2023-08-08 DIAGNOSIS — Z7982 Long term (current) use of aspirin: Secondary | ICD-10-CM

## 2023-08-08 DIAGNOSIS — Z882 Allergy status to sulfonamides status: Secondary | ICD-10-CM

## 2023-08-08 DIAGNOSIS — Z96642 Presence of left artificial hip joint: Secondary | ICD-10-CM | POA: Diagnosis present

## 2023-08-08 DIAGNOSIS — Z886 Allergy status to analgesic agent status: Secondary | ICD-10-CM

## 2023-08-08 DIAGNOSIS — I5032 Chronic diastolic (congestive) heart failure: Secondary | ICD-10-CM | POA: Diagnosis present

## 2023-08-08 DIAGNOSIS — Z8719 Personal history of other diseases of the digestive system: Secondary | ICD-10-CM

## 2023-08-08 DIAGNOSIS — E559 Vitamin D deficiency, unspecified: Secondary | ICD-10-CM | POA: Diagnosis present

## 2023-08-08 DIAGNOSIS — R54 Age-related physical debility: Secondary | ICD-10-CM | POA: Insufficient documentation

## 2023-08-08 DIAGNOSIS — I10 Essential (primary) hypertension: Secondary | ICD-10-CM | POA: Diagnosis not present

## 2023-08-08 DIAGNOSIS — J449 Chronic obstructive pulmonary disease, unspecified: Secondary | ICD-10-CM | POA: Diagnosis present

## 2023-08-08 DIAGNOSIS — I2510 Atherosclerotic heart disease of native coronary artery without angina pectoris: Secondary | ICD-10-CM | POA: Diagnosis present

## 2023-08-08 DIAGNOSIS — R0602 Shortness of breath: Secondary | ICD-10-CM | POA: Diagnosis not present

## 2023-08-08 DIAGNOSIS — J189 Pneumonia, unspecified organism: Principal | ICD-10-CM | POA: Diagnosis present

## 2023-08-08 DIAGNOSIS — Z8679 Personal history of other diseases of the circulatory system: Secondary | ICD-10-CM

## 2023-08-08 DIAGNOSIS — I110 Hypertensive heart disease with heart failure: Secondary | ICD-10-CM | POA: Diagnosis present

## 2023-08-08 DIAGNOSIS — K219 Gastro-esophageal reflux disease without esophagitis: Secondary | ICD-10-CM | POA: Diagnosis present

## 2023-08-08 DIAGNOSIS — Z801 Family history of malignant neoplasm of trachea, bronchus and lung: Secondary | ICD-10-CM

## 2023-08-08 DIAGNOSIS — R0609 Other forms of dyspnea: Secondary | ICD-10-CM | POA: Diagnosis present

## 2023-08-08 DIAGNOSIS — J470 Bronchiectasis with acute lower respiratory infection: Secondary | ICD-10-CM | POA: Diagnosis present

## 2023-08-08 DIAGNOSIS — Z8616 Personal history of COVID-19: Secondary | ICD-10-CM

## 2023-08-08 DIAGNOSIS — Z8619 Personal history of other infectious and parasitic diseases: Secondary | ICD-10-CM

## 2023-08-08 DIAGNOSIS — Z825 Family history of asthma and other chronic lower respiratory diseases: Secondary | ICD-10-CM

## 2023-08-08 DIAGNOSIS — Z9049 Acquired absence of other specified parts of digestive tract: Secondary | ICD-10-CM

## 2023-08-08 DIAGNOSIS — I340 Nonrheumatic mitral (valve) insufficiency: Secondary | ICD-10-CM | POA: Diagnosis present

## 2023-08-08 DIAGNOSIS — Z86711 Personal history of pulmonary embolism: Secondary | ICD-10-CM | POA: Diagnosis present

## 2023-08-08 DIAGNOSIS — Z803 Family history of malignant neoplasm of breast: Secondary | ICD-10-CM

## 2023-08-08 DIAGNOSIS — Z820 Family history of epilepsy and other diseases of the nervous system: Secondary | ICD-10-CM

## 2023-08-08 DIAGNOSIS — G20A1 Parkinson's disease without dyskinesia, without mention of fluctuations: Secondary | ICD-10-CM | POA: Diagnosis present

## 2023-08-08 DIAGNOSIS — Z66 Do not resuscitate: Secondary | ICD-10-CM | POA: Diagnosis present

## 2023-08-08 DIAGNOSIS — J440 Chronic obstructive pulmonary disease with acute lower respiratory infection: Secondary | ICD-10-CM | POA: Diagnosis present

## 2023-08-08 DIAGNOSIS — J984 Other disorders of lung: Secondary | ICD-10-CM | POA: Diagnosis not present

## 2023-08-08 DIAGNOSIS — I712 Thoracic aortic aneurysm, without rupture, unspecified: Secondary | ICD-10-CM | POA: Diagnosis present

## 2023-08-08 DIAGNOSIS — A310 Pulmonary mycobacterial infection: Secondary | ICD-10-CM | POA: Diagnosis present

## 2023-08-08 DIAGNOSIS — Z90710 Acquired absence of both cervix and uterus: Secondary | ICD-10-CM

## 2023-08-08 DIAGNOSIS — J4520 Mild intermittent asthma, uncomplicated: Secondary | ICD-10-CM | POA: Diagnosis present

## 2023-08-08 DIAGNOSIS — Z808 Family history of malignant neoplasm of other organs or systems: Secondary | ICD-10-CM

## 2023-08-08 DIAGNOSIS — H2511 Age-related nuclear cataract, right eye: Secondary | ICD-10-CM | POA: Diagnosis present

## 2023-08-08 DIAGNOSIS — R911 Solitary pulmonary nodule: Secondary | ICD-10-CM | POA: Diagnosis not present

## 2023-08-08 MED ORDER — IOHEXOL 350 MG/ML SOLN
75.0000 mL | Freq: Once | INTRAVENOUS | Status: AC | PRN
  Administered 2023-08-08: 75 mL via INTRAVENOUS

## 2023-08-08 NOTE — ED Triage Notes (Signed)
Pt to ED via [facility] c/o abnormal labs. Pt went to doctor on 9th for cough and increasing sob. Ddimer was done, result was 1.97. doctor told pt to come to ED for further testing. Pt not complaining of anything at this time

## 2023-08-08 NOTE — Telephone Encounter (Signed)
Got a call from Crystal Bass 10:20 PM 08/08/2023 at [facility] - patient sent to [facility] ER due to high d-dimer. Needs PE ruled out  Triage: if she ended up in ER you can cancel my order

## 2023-08-08 NOTE — Telephone Encounter (Signed)
Got a call from Marisue Ivan CNA at [facility]  -pulse ox 92% RA which patient says is her baseline - patient feels stable/fine to CNA - not worse in last 48h definitely -  [place] to check on patient at 2AM  Plan - Shanda Bumps Crdcik to update patient NOW and then to inform patioent that she should call EMS if unwel or wait till 08/09/23 and get CTA done in day ordeed by our office

## 2023-08-08 NOTE — ED Provider Notes (Signed)
[facility] Provider Note    Event Date/Time   First MD Initiated Contact with Patient 08/08/23 2259     (approximate)   History   Abnormal Labs   HPI  Crystal Bass is a 82 y.o. female who presents to the ED for evaluation of Abnormal Labs   Review of pulmonary clinic visit from 9/9.  Mitral regurg s/p clipping, diastolic CHF, COPD, Parkinson's.  History of PE.  COVID infection a couple months ago, during this clinic visit had worsening fatigue, dyspnea and a D-dimer was sent.  Returning positive and she was sent to the ED. Normal CBC and basic metabolic panel, D-dimer of 1.97, BNP of 484  Patient presents at the direction of her pulmonologist with a positive D-dimer.  She reports shortness of breath and cough on a subacute timeframe that does not seem much worse in the past couple days.  No chest pain or syncope.  No fevers.  Physical Exam   Triage Vital Signs: ED Triage Vitals  Encounter Vitals Group     BP 08/08/23 2228 121/87     Systolic BP Percentile --      Diastolic BP Percentile --      Pulse Rate 08/08/23 2228 (!) 59     Resp 08/08/23 2228 16     Temp 08/08/23 2228 97.8 F (36.6 C)     Temp Source 08/08/23 2228 Oral     SpO2 08/08/23 2228 93 %     Weight --      Height --      Head Circumference --      Peak Flow --      Pain Score 08/08/23 2231 0     Pain Loc --      Pain Education --      Exclude from Growth Chart --     Most recent vital signs: Vitals:   08/09/23 0237 08/09/23 0315  BP:  (!) 147/108  Pulse:  72  Resp: (!) 27 18  Temp: 98 F (36.7 C) 98 F (36.7 C)  SpO2: 96% 94%    General: Awake, no distress.  Speaking in full sentences, looks well CV:  Good peripheral perfusion.  RRR Resp:  Normal effort.  Faint scattered expiratory wheezes. Abd:  No distention.  Soft MSK:  No deformity noted.  Neuro:  No focal deficits appreciated. Other:     ED Results / Procedures / Treatments   Labs (all labs  ordered are listed, but only abnormal results are displayed) Labs Reviewed  CBC WITH DIFFERENTIAL/PLATELET - Abnormal; Notable for the following components:      Result Value   RBC 3.65 (*)    MCV 101.6 (*)    Lymphs Abs 0.5 (*)    All other components within normal limits  BASIC METABOLIC PANEL - Abnormal; Notable for the following components:   Glucose, Bld 112 (*)    Calcium 8.6 (*)    All other components within normal limits  CULTURE, BLOOD (ROUTINE X 2)  CULTURE, BLOOD (ROUTINE X 2)  PROCALCITONIN  LACTIC ACID, PLASMA  LACTIC ACID, PLASMA    EKG Tremulous baseline and poor quality EKG.  Sinus rhythm with a rate of 72 bpm.  Normal axis and intervals.  Stigmata of LVH.  No STEMI.  RADIOLOGY CTA chest interpreted by me with left basilar pneumonia but no PE  Official radiology report(s): CT Angio Chest PE W and/or Wo Contrast  Result Date: 08/09/2023 CLINICAL DATA:  Positive  D-dimer. EXAM: CT ANGIOGRAPHY CHEST WITH CONTRAST TECHNIQUE: Multidetector CT imaging of the chest was performed using the standard protocol during bolus administration of intravenous contrast. Multiplanar CT image reconstructions and MIPs were obtained to evaluate the vascular anatomy. RADIATION DOSE REDUCTION: This exam was performed according to the departmental dose-optimization program which includes automated exposure control, adjustment of the mA and/or kV according to patient size and/or use of iterative reconstruction technique. CONTRAST:  75mL OMNIPAQUE IOHEXOL 350 MG/ML SOLN COMPARISON:  CT angiogram chest 11/21/2022 FINDINGS: Cardiovascular: There is adequate opacification of the pulmonary arteries to the segmental level. There is no evidence for pulmonary embolism. Main pulmonary artery is enlarged compatible with pulmonary artery hypertension. The heart is moderately enlarged, unchanged. There is no pericardial effusion. The aorta is ectatic without focal dilatation. There surgical changes in the  heart. Mediastinum/Nodes: There are prominent, but nonenlarged left hilar lymph nodes. No other enlarged mediastinal lymph nodes are seen. Visualized esophagus and thyroid gland are within normal limits. Lungs/Pleura: Focal opacity in the medial right upper lobe near surgical staple line appears unchanged from the prior examination and, likely representing postoperative change. There is new patchy airspace opacity throughout the left lower lobe and minimally in the left upper lobe. There are numerous new nodular densities in the left upper lobe and left lower lobe in the region of airspace disease measuring 12 mm or less. Focal right lower lobe nodule which is slightly spiculated measuring up to 12 mm image 5/67 appears unchanged from prior. There also few scattered new nodular densities in the right lower lobe measuring up to 10 mm. Multifocal ground-glass opacities are seen throughout both lungs, similar to prior. There is no pleural effusion or pneumothorax. Upper Abdomen: There are calcifications in the right kidney. Musculoskeletal: Left mastectomy changes. There surgical clips in the left axilla. No acute osseous abnormality. Review of the MIP images confirms the above findings. IMPRESSION: 1. No evidence for pulmonary embolism. 2. New patchy airspace disease throughout the left lower lobe and minimally in the left upper lobe worrisome for pneumonia. 3. New nodular densities in the left upper lobe and left lower lobe measuring up to 12 mm. Findings may be infectious/inflammatory, but metastatic disease is not excluded. 4. New nodular densities in the right lower lobe measuring up to 10 mm. Findings may be infectious/inflammatory or neoplastic. 5. Stable right lower lobe nodule measuring 12 mm. 6. Stable ground-glass opacities throughout both lungs. 7. Stable moderate cardiomegaly. 8. Findings compatible with pulmonary artery hypertension. Electronically Signed   By: Darliss Cheney M.D.   On: 08/09/2023 00:05     PROCEDURES and INTERVENTIONS:  .1-3 Lead EKG Interpretation  Performed by: Delton Prairie, MD Authorized by: Delton Prairie, MD     Interpretation: normal     ECG rate:  70   ECG rate assessment: normal     Rhythm: sinus rhythm     Ectopy: none     Conduction: normal     Medications  iohexol (OMNIPAQUE) 350 MG/ML injection 75 mL (75 mLs Intravenous Contrast Given 08/08/23 2322)  methylPREDNISolone sodium succinate (SOLU-MEDROL) 125 mg/2 mL injection 125 mg (125 mg Intravenous Given 08/09/23 0042)  cefTRIAXone (ROCEPHIN) 1 g in sodium chloride 0.9 % 100 mL IVPB (0 g Intravenous Stopped 08/09/23 0113)  azithromycin (ZITHROMAX) 500 mg in sodium chloride 0.9 % 250 mL IVPB (0 mg Intravenous Stopped 08/09/23 0221)     IMPRESSION / MDM / ASSESSMENT AND PLAN / ED COURSE  I reviewed the triage vital signs  and the nursing notes.  Differential diagnosis includes, but is not limited to, PE, pneumonia, pneumothorax, COPD exacerbation  {Patient presents with symptoms of an acute illness or injury that is potentially life-threatening.  Pleasant woman from a local SNF presents due to a positive D-dimer, found to have evidence of pneumonia but no PE, ultimately requiring medical admission due to her shortness of breath and borderline saturations with ambulation.  We initially just obtain a CTA chest as she has no complaints and due to her outpatient positive D-dimer with normal vital signs and room air.  Demonstrating a pneumonia but no PE.  With ambulation she is somewhat tachypneic and dyspneic but no hypoxia.  Initially requesting discharge but "having a change of heart."  After I had offered admission.  Blood work returning without sepsis criteria.  Consult medicine for admission  Clinical Course as of 08/09/23 0350  Fri Aug 09, 2023  1191 Discussed plan of care with the patient after CT results.  She expresses a desire to return to her facility.  She does have normal vital signs, at least while  supine.  We discussed antibiotics for CAP coverage, ambulation with pulse ox and reassessment to elucidate plan of care.  She is agreeable. [DS]  0249 Reassessed after ambulation.  Nursing staff tells me that she was not quite hypoxic, down to 90% but nursing staff was somewhat concerned about her work of breathing with respiratory rate nearly 30.   When I discussed with the patient, she reports that she always does that when she walks.  I recommend admission and she prefer to go back.  We discussed the risks and benefits and she would still prefer to go back to her facility.  She has capacity make this decision.  We discussed steroids, antibiotic prescriptions.  We discussed return precautions.  She is appreciative for the opportunity to go back to her facility. [DS]  4782 I am called back into the room.  Patient changed her mind and is wanting to stay.  She is certain.  We will draw some labs and consult medicine for admission [DS]    Clinical Course User Index [DS] Delton Prairie, MD     FINAL CLINICAL IMPRESSION(S) / ED DIAGNOSES   Final diagnoses:  Community acquired pneumonia of left lower lobe of lung     Rx / DC Orders   ED Discharge Orders          Ordered    predniSONE (DELTASONE) 50 MG tablet  Daily        08/09/23 0251    cefdinir (OMNICEF) 300 MG capsule  2 times daily        08/09/23 0251    azithromycin (ZITHROMAX Z-PAK) 250 MG tablet        08/09/23 0251             Note:  This document was prepared using Dragon voice recognition software and may include unintentional dictation errors.   Delton Prairie, MD 08/09/23 [phone]

## 2023-08-08 NOTE — Telephone Encounter (Signed)
   Called patient Crystal Bass 8:04 PM - [phone] but she did not pick up. Then, I called brother Legrand Rams DPOA [phone] and LMTCB directly on my cell. Thenm I called niece Cheryll Cockayne - she says earlier 08/08/2023= patient was well and went to eye doctor 08/08/2023 but since then not able to get hold of patient. Marylene Land said she will try to call patient and her uncle the DPOA Legrand Rams. Gave her my cell too. Adviused to go to ER 08/08/2023 rule out blood clot if any change in dyspnea but otherwise 08/09/23 AM needs CTA

## 2023-08-08 NOTE — ED Notes (Signed)
Patient  taken for CT scan

## 2023-08-08 NOTE — Telephone Encounter (Signed)
  D-dimer and BNP high  Plan  - get CTA rule out PE on 08/08/23 -results to office of 667 pager (MR night shift) - get echo next few weeks    LABS    Latest Reference Range & Units 01/13/19 12:55 08/05/23 11:14  D-Dimer, Quant <0.50 mcg/mL FEU <0.19 1.97 (H)  (H): Data is abnormally high  PULMONARY No results for input(s): "PHART", "PCO2ART", "PO2ART", "HCO3", "TCO2", "O2SAT" in the last 168 hours.  Invalid input(s): "PCO2", "PO2"  CBC Recent Labs  Lab 08/05/23 1114  HGB 13.0  HCT 40.8  WBC 6.9  PLT 328.0    COAGULATION No results for input(s): "INR" in the last 168 hours.  CARDIAC  No results for input(s): "TROPONINI" in the last 168 hours. Recent Labs  Lab 08/05/23 1114  PROBNP 484.0*     CHEMISTRY Recent Labs  Lab 08/05/23 1114  NA 139  K 4.6  CL 101  CO2 31  GLUCOSE 92  BUN 16  CREATININE 0.83  CALCIUM 9.6   Estimated Creatinine Clearance: 42.8 mL/min (by C-G formula based on SCr of 0.83 mg/dL).   LIVER Recent Labs  Lab 08/05/23 1114  AST 13  ALT 2  ALKPHOS 98  BILITOT 1.0  PROT 7.6  ALBUMIN 3.7     INFECTIOUS No results for input(s): "LATICACIDVEN", "PROCALCITON" in the last 168 hours.   ENDOCRINE CBG (last 3)  No results for input(s): "GLUCAP" in the last 72 hours.       IMAGING x48h  - image(s) personally visualized  -   highlighted in bold No results found.

## 2023-08-08 NOTE — Telephone Encounter (Signed)
Tried calling the pt and there was no answer- LMTCB.  

## 2023-08-09 ENCOUNTER — Encounter: Payer: Self-pay | Admitting: Internal Medicine

## 2023-08-09 DIAGNOSIS — R7989 Other specified abnormal findings of blood chemistry: Secondary | ICD-10-CM | POA: Diagnosis present

## 2023-08-09 DIAGNOSIS — J470 Bronchiectasis with acute lower respiratory infection: Secondary | ICD-10-CM | POA: Diagnosis present

## 2023-08-09 DIAGNOSIS — J189 Pneumonia, unspecified organism: Secondary | ICD-10-CM

## 2023-08-09 DIAGNOSIS — I110 Hypertensive heart disease with heart failure: Secondary | ICD-10-CM | POA: Diagnosis present

## 2023-08-09 DIAGNOSIS — R0609 Other forms of dyspnea: Secondary | ICD-10-CM | POA: Diagnosis not present

## 2023-08-09 DIAGNOSIS — R918 Other nonspecific abnormal finding of lung field: Secondary | ICD-10-CM

## 2023-08-09 DIAGNOSIS — M810 Age-related osteoporosis without current pathological fracture: Secondary | ICD-10-CM | POA: Diagnosis present

## 2023-08-09 DIAGNOSIS — E559 Vitamin D deficiency, unspecified: Secondary | ICD-10-CM | POA: Diagnosis present

## 2023-08-09 DIAGNOSIS — R54 Age-related physical debility: Secondary | ICD-10-CM | POA: Diagnosis present

## 2023-08-09 DIAGNOSIS — A310 Pulmonary mycobacterial infection: Secondary | ICD-10-CM | POA: Diagnosis not present

## 2023-08-09 DIAGNOSIS — I712 Thoracic aortic aneurysm, without rupture, unspecified: Secondary | ICD-10-CM | POA: Diagnosis present

## 2023-08-09 DIAGNOSIS — Z8619 Personal history of other infectious and parasitic diseases: Secondary | ICD-10-CM | POA: Diagnosis not present

## 2023-08-09 DIAGNOSIS — Z800 Family history of malignant neoplasm of digestive organs: Secondary | ICD-10-CM | POA: Diagnosis not present

## 2023-08-09 DIAGNOSIS — I5032 Chronic diastolic (congestive) heart failure: Secondary | ICD-10-CM | POA: Diagnosis present

## 2023-08-09 DIAGNOSIS — J4520 Mild intermittent asthma, uncomplicated: Secondary | ICD-10-CM | POA: Diagnosis present

## 2023-08-09 DIAGNOSIS — Z803 Family history of malignant neoplasm of breast: Secondary | ICD-10-CM | POA: Diagnosis not present

## 2023-08-09 DIAGNOSIS — H2511 Age-related nuclear cataract, right eye: Secondary | ICD-10-CM | POA: Diagnosis present

## 2023-08-09 DIAGNOSIS — J449 Chronic obstructive pulmonary disease, unspecified: Secondary | ICD-10-CM

## 2023-08-09 DIAGNOSIS — Z7982 Long term (current) use of aspirin: Secondary | ICD-10-CM | POA: Diagnosis not present

## 2023-08-09 DIAGNOSIS — Z853 Personal history of malignant neoplasm of breast: Secondary | ICD-10-CM | POA: Diagnosis not present

## 2023-08-09 DIAGNOSIS — Z9012 Acquired absence of left breast and nipple: Secondary | ICD-10-CM | POA: Diagnosis not present

## 2023-08-09 DIAGNOSIS — Z86711 Personal history of pulmonary embolism: Secondary | ICD-10-CM

## 2023-08-09 DIAGNOSIS — Z801 Family history of malignant neoplasm of trachea, bronchus and lung: Secondary | ICD-10-CM | POA: Diagnosis not present

## 2023-08-09 DIAGNOSIS — Z95818 Presence of other cardiac implants and grafts: Secondary | ICD-10-CM

## 2023-08-09 DIAGNOSIS — Z66 Do not resuscitate: Secondary | ICD-10-CM | POA: Diagnosis present

## 2023-08-09 DIAGNOSIS — J440 Chronic obstructive pulmonary disease with acute lower respiratory infection: Secondary | ICD-10-CM | POA: Diagnosis present

## 2023-08-09 DIAGNOSIS — G20A1 Parkinson's disease without dyskinesia, without mention of fluctuations: Secondary | ICD-10-CM | POA: Diagnosis present

## 2023-08-09 DIAGNOSIS — I340 Nonrheumatic mitral (valve) insufficiency: Secondary | ICD-10-CM | POA: Diagnosis present

## 2023-08-09 DIAGNOSIS — Z8616 Personal history of COVID-19: Secondary | ICD-10-CM | POA: Diagnosis not present

## 2023-08-09 DIAGNOSIS — Z9889 Other specified postprocedural states: Secondary | ICD-10-CM

## 2023-08-09 DIAGNOSIS — I2510 Atherosclerotic heart disease of native coronary artery without angina pectoris: Secondary | ICD-10-CM | POA: Diagnosis present

## 2023-08-09 LAB — BASIC METABOLIC PANEL
Anion gap: 9 (ref 5–15)
BUN: 23 mg/dL (ref 8–23)
CO2: 28 mmol/L (ref 22–32)
Calcium: 8.6 mg/dL — ABNORMAL LOW (ref 8.9–10.3)
Chloride: 100 mmol/L (ref 98–111)
Creatinine, Ser: 0.75 mg/dL (ref 0.44–1.00)
GFR, Estimated: >60 mL/min (ref >60)
Glucose, Bld: 112 mg/dL — ABNORMAL HIGH (ref 70–99)
Potassium: 4 mmol/L (ref 3.5–5.1)
Sodium: 137 mmol/L (ref 135–145)

## 2023-08-09 LAB — CBC WITH DIFFERENTIAL/PLATELET
Abs Immature Granulocytes: 0.04 10*3/uL (ref 0.00–0.07)
Basophils Absolute: 0.1 10*3/uL (ref 0.0–0.1)
Basophils Relative: 1 %
Eosinophils Absolute: 0.1 10*3/uL (ref 0.0–0.5)
Eosinophils Relative: 2 %
HCT: 37.1 % (ref 36.0–46.0)
Hemoglobin: 12 g/dL (ref 12.0–15.0)
Immature Granulocytes: 1 %
Lymphocytes Relative: 8 %
Lymphs Abs: 0.5 10*3/uL — ABNORMAL LOW (ref 0.7–4.0)
MCH: 32.9 pg (ref 26.0–34.0)
MCHC: 32.3 g/dL (ref 30.0–36.0)
MCV: 101.6 fL — ABNORMAL HIGH (ref 80.0–100.0)
Monocytes Absolute: 0.3 10*3/uL (ref 0.1–1.0)
Monocytes Relative: 4 %
Neutro Abs: 4.9 10*3/uL (ref 1.7–7.7)
Neutrophils Relative %: 84 %
Platelets: 216 10*3/uL (ref 150–400)
RBC: 3.65 MIL/uL — ABNORMAL LOW (ref 3.87–5.11)
RDW: 14 % (ref 11.5–15.5)
WBC: 5.9 10*3/uL (ref 4.0–10.5)
nRBC: 0 % (ref 0.0–0.2)

## 2023-08-09 LAB — LACTIC ACID, PLASMA: Lactic Acid, Venous: 1.2 mmol/L (ref 0.5–1.9)

## 2023-08-09 LAB — PROCALCITONIN: Procalcitonin: <0.1 ng/mL

## 2023-08-09 MED ORDER — UMECLIDINIUM BROMIDE 62.5 MCG/ACT IN AEPB
1.0000 | INHALATION_SPRAY | Freq: Every day | RESPIRATORY_TRACT | Status: DC
  Administered 2023-08-10: 1 via RESPIRATORY_TRACT
  Filled 2023-08-09: qty 7

## 2023-08-09 MED ORDER — PREDNISONE 50 MG PO TABS: 50.0000 mg | ORAL_TABLET | Freq: Every day | ORAL | 0 refills | Status: AC

## 2023-08-09 MED ORDER — SODIUM CHLORIDE 0.9 % IV SOLN
1.0000 g | Freq: Once | INTRAVENOUS | Status: AC
  Administered 2023-08-09: 1 g via INTRAVENOUS
  Filled 2023-08-09: qty 10

## 2023-08-09 MED ORDER — ACETAMINOPHEN 650 MG RE SUPP: 650.0000 mg | Freq: Four times a day (QID) | RECTAL | Status: DC | PRN

## 2023-08-09 MED ORDER — SODIUM CHLORIDE 3 % IN NEBU
4.0000 mL | INHALATION_SOLUTION | Freq: Two times a day (BID) | RESPIRATORY_TRACT | Status: DC
  Administered 2023-08-09 – 2023-08-10 (×2): 4 mL via RESPIRATORY_TRACT
  Filled 2023-08-09 (×4): qty 4

## 2023-08-09 MED ORDER — POTASSIUM CHLORIDE CRYS ER 20 MEQ PO TBCR
20.0000 meq | EXTENDED_RELEASE_TABLET | Freq: Every day | ORAL | Status: DC
  Administered 2023-08-10: 20 meq via ORAL
  Filled 2023-08-09 (×2): qty 1

## 2023-08-09 MED ORDER — AZITHROMYCIN 250 MG PO TABS: ORAL_TABLET | ORAL | 0 refills | Status: AC

## 2023-08-09 MED ORDER — NON FORMULARY: 1.0000 [drp] | Freq: Two times a day (BID) | Status: DC

## 2023-08-09 MED ORDER — ACETAMINOPHEN 325 MG PO TABS
650.0000 mg | ORAL_TABLET | Freq: Four times a day (QID) | ORAL | Status: DC | PRN
  Administered 2023-08-10: 650 mg via ORAL
  Filled 2023-08-09: qty 2

## 2023-08-09 MED ORDER — ONDANSETRON HCL 4 MG PO TABS: 4.0000 mg | ORAL_TABLET | Freq: Four times a day (QID) | ORAL | Status: DC | PRN

## 2023-08-09 MED ORDER — ASPIRIN 81 MG PO TBEC
81.0000 mg | DELAYED_RELEASE_TABLET | Freq: Every day | ORAL | Status: DC
  Administered 2023-08-09 – 2023-08-10 (×2): 81 mg via ORAL
  Filled 2023-08-09 (×2): qty 1

## 2023-08-09 MED ORDER — CEFDINIR 300 MG PO CAPS: 300.0000 mg | ORAL_CAPSULE | Freq: Two times a day (BID) | ORAL | 0 refills | Status: AC

## 2023-08-09 MED ORDER — METHYLPREDNISOLONE SODIUM SUCC 125 MG IJ SOLR
125.0000 mg | Freq: Once | INTRAMUSCULAR | Status: AC
  Administered 2023-08-09: 125 mg via INTRAVENOUS
  Filled 2023-08-09: qty 2

## 2023-08-09 MED ORDER — PANTOPRAZOLE SODIUM 20 MG PO TBEC
20.0000 mg | DELAYED_RELEASE_TABLET | Freq: Every day | ORAL | Status: DC
  Administered 2023-08-09 – 2023-08-10 (×2): 20 mg via ORAL
  Filled 2023-08-09 (×2): qty 1

## 2023-08-09 MED ORDER — SODIUM CHLORIDE 0.9 % IV SOLN
500.0000 mg | INTRAVENOUS | Status: DC
  Administered 2023-08-10: 500 mg via INTRAVENOUS
  Filled 2023-08-09: qty 5

## 2023-08-09 MED ORDER — GATIFLOXACIN 0.5 % OP SOLN
1.0000 [drp] | Freq: Four times a day (QID) | OPHTHALMIC | Status: DC
  Administered 2023-08-09: 1 [drp] via OPHTHALMIC
  Filled 2023-08-09: qty 2.5

## 2023-08-09 MED ORDER — PREDNISOLON-MOXIFLOX-BROMFENAC 1-0.5-0.075 % OP SOLN
1.0000 [drp] | Freq: Two times a day (BID) | OPHTHALMIC | Status: DC
  Filled 2023-08-09: qty 5

## 2023-08-09 MED ORDER — CARBIDOPA-LEVODOPA ER 25-100 MG PO TBCR
1.0000 | EXTENDED_RELEASE_TABLET | Freq: Every day | ORAL | Status: DC
  Administered 2023-08-10: 1 via ORAL
  Filled 2023-08-09: qty 1

## 2023-08-09 MED ORDER — SODIUM CHLORIDE 0.9 % IV SOLN
2.0000 g | INTRAVENOUS | Status: DC
  Administered 2023-08-10: 2 g via INTRAVENOUS
  Filled 2023-08-09: qty 20

## 2023-08-09 MED ORDER — CLONAZEPAM 0.5 MG PO TABS
0.5000 mg | ORAL_TABLET | Freq: Every day | ORAL | Status: DC
  Administered 2023-08-10: 0.5 mg via ORAL
  Filled 2023-08-09: qty 1

## 2023-08-09 MED ORDER — ONDANSETRON HCL 4 MG/2ML IJ SOLN: 4.0000 mg | Freq: Four times a day (QID) | INTRAMUSCULAR | Status: DC | PRN

## 2023-08-09 MED ORDER — CARBIDOPA-LEVODOPA ER 25-100 MG PO TBCR
1.0000 | EXTENDED_RELEASE_TABLET | Freq: Three times a day (TID) | ORAL | Status: DC
  Administered 2023-08-09 (×2): 1 via ORAL
  Filled 2023-08-09 (×5): qty 1

## 2023-08-09 MED ORDER — CARBIDOPA-LEVODOPA 25-100 MG PO TABS
1.0000 | ORAL_TABLET | Freq: Every morning | ORAL | Status: DC
  Administered 2023-08-09: 1 via ORAL
  Filled 2023-08-09: qty 1

## 2023-08-09 MED ORDER — CARBIDOPA-LEVODOPA ER 25-100 MG PO TBCR
1.0000 | EXTENDED_RELEASE_TABLET | Freq: Once | ORAL | Status: AC
  Administered 2023-08-10: 1 via ORAL
  Filled 2023-08-09: qty 1

## 2023-08-09 MED ORDER — ALBUTEROL SULFATE (2.5 MG/3ML) 0.083% IN NEBU
2.5000 mg | INHALATION_SOLUTION | Freq: Two times a day (BID) | RESPIRATORY_TRACT | Status: DC
  Administered 2023-08-09 – 2023-08-10 (×2): 2.5 mg via RESPIRATORY_TRACT
  Filled 2023-08-09 (×2): qty 3

## 2023-08-09 MED ORDER — GABAPENTIN 300 MG PO CAPS
300.0000 mg | ORAL_CAPSULE | Freq: Three times a day (TID) | ORAL | Status: DC
  Administered 2023-08-09 – 2023-08-10 (×4): 300 mg via ORAL
  Filled 2023-08-09 (×4): qty 1

## 2023-08-09 MED ORDER — PREDNISOLONE ACETATE 1 % OP SUSP
1.0000 [drp] | Freq: Four times a day (QID) | OPHTHALMIC | Status: DC
  Administered 2023-08-09: 1 [drp] via OPHTHALMIC
  Filled 2023-08-09: qty 1

## 2023-08-09 MED ORDER — PREDNISOLON-MOXIFLOX-BROMFENAC 1-0.5-0.075 % OP SOLN
1.0000 [drp] | Freq: Four times a day (QID) | OPHTHALMIC | Status: DC
  Filled 2023-08-09: qty 5

## 2023-08-09 MED ORDER — IPRATROPIUM-ALBUTEROL 0.5-2.5 (3) MG/3ML IN SOLN
3.0000 mL | RESPIRATORY_TRACT | Status: DC | PRN
  Administered 2023-08-09: 3 mL via RESPIRATORY_TRACT
  Filled 2023-08-09: qty 3

## 2023-08-09 MED ORDER — FUROSEMIDE 40 MG PO TABS
40.0000 mg | ORAL_TABLET | Freq: Every day | ORAL | Status: DC
  Administered 2023-08-09 – 2023-08-10 (×2): 40 mg via ORAL
  Filled 2023-08-09 (×2): qty 1

## 2023-08-09 MED ORDER — PREDNISOLONE ACETATE 1 % OP SUSP: 1.0000 [drp] | Freq: Two times a day (BID) | OPHTHALMIC | Status: DC

## 2023-08-09 MED ORDER — SODIUM CHLORIDE 0.9 % IV SOLN
500.0000 mg | Freq: Once | INTRAVENOUS | Status: AC
  Administered 2023-08-09: 500 mg via INTRAVENOUS
  Filled 2023-08-09: qty 5

## 2023-08-09 MED ORDER — FLUTICASONE FUROATE-VILANTEROL 100-25 MCG/ACT IN AEPB
1.0000 | INHALATION_SPRAY | Freq: Every day | RESPIRATORY_TRACT | Status: DC
  Administered 2023-08-10: 1 via RESPIRATORY_TRACT
  Filled 2023-08-09: qty 28

## 2023-08-09 MED ORDER — ENOXAPARIN SODIUM 40 MG/0.4ML IJ SOSY
40.0000 mg | PREFILLED_SYRINGE | INTRAMUSCULAR | Status: DC
  Administered 2023-08-10: 40 mg via SUBCUTANEOUS
  Filled 2023-08-09 (×2): qty 0.4

## 2023-08-09 NOTE — Telephone Encounter (Signed)
CTA performed on 08/08/23  Cancelling MR's order for this

## 2023-08-09 NOTE — ED Notes (Signed)
Patient up to restroom and back to ER stretcher without incident. Meds given.  VSS, call light within reach. No other needs identified at this time.

## 2023-08-09 NOTE — Assessment & Plan Note (Addendum)
Patient had COVID a couple months prior, per review of pulmonology notes Ruled out for PE with CTA chest Possible CAP but suspecting multifactorial related to COPD, severe mitral valve regurg, new pulmonary nodules Will treat CAP as outlined on the specific problem Supplemental oxygen if needed Continue to follow-up with primary pulmonologist and cardiologist

## 2023-08-09 NOTE — Consult Note (Signed)
NAME:  Crystal Bass, MRN:  [id number], DOB:  07/05/1941, LOS: 0 ADMISSION DATE:  08/08/2023, CONSULTATION DATE:  08/09/2023 REFERRING MD:  Enedina Finner, MD, CHIEF COMPLAINT:  Respiratory Failure   History of Present Illness:   The patient is an 82 year old female with a past medical history of asthma, prior [name], and severe mitral regurgitation (s/p clipping) who presents to the hospital with increased shortness of breath sent in by Dr. Marchelle Gearing.  Patient was seen by Dr. Marchelle Gearing on September 9 for increased shortness of breath of around 2 months in duration following a bout with COVID.  She also reported fatigue and a worsening cough.  Given history of pulmonary embolism as well as recent COVID, blood work was sent for D-dimer and BNP, both of which returning positive (BNP was elevated at 484, D-dimer was elevated at 1.97).  Patient was asked by Dr. Marchelle Gearing to present to the emergency department for further workup.  On presentation, she underwent a CT scan of the chest with PE protocol that was negative for any pulmonary embolism.  The CT scan however showed a new patchy airspace opacity throughout the left lower lobe as well as numerous new nodular densities.  Given said findings patient is admitted to the hospital medicine service and pulmonary is consulted.  Patient reports shortness of breath that is chronic and relatively unchanged. She does report extreme fatigue and weakness. She has a cough that she reports turned productive recently, and she described the expectoration of brownish appearing phlegm. Patient has had no fever or chills, no night sweats, and no loss of consciousness.  Patient has received one dose of solumedrol (125 mg), Ceftriaxone, and Azithromycin. She was continued on her inhalers and home medications and admitted to [facility].  Pertinent  Medical History  #HFpEF #Asthma #History of [name] infection (reportedly in 2002, treated at that time). #Hypertension #Pulmonary  embolism #Breast cancer (stage I, ER+ve, diagnosed 2002, s/p left mastectomy, adjuvant radiation, and tamoxifen for 10 years followed by aromatase inhibitor) Parkinson's disease (on cabidopa/levodopa) Mitral regurgitation status post clip (2023 & 2024)   Objective   Blood pressure (!) 145/91, pulse 72, temperature 98 F (36.7 C), temperature source Oral, resp. rate (!) 24, height 5\' 2"  (1.575 m), SpO2 94%.        Intake/Output Summary (Last 24 hours) at 08/09/2023 0939 Last data filed at 08/09/2023 0553 Gross per 24 hour  Intake 445.76 ml  Output --  Net 445.76 ml   There were no vitals filed for this visit.  Examination: Physical Exam Constitutional:      General: She is not in acute distress.    Appearance: She is ill-appearing.  Cardiovascular:     Rate and Rhythm: Normal rate and regular rhythm.     Heart sounds: Murmur (systolic ejection murmur over the axilla) heard.  Pulmonary:     Breath sounds: Rales present. No wheezing.  Musculoskeletal:     Right lower leg: No edema.     Left lower leg: No edema.  Neurological:     Mental Status: She is alert and oriented to person, place, and time.      Assessment & Plan:   #Shortness of Breath #Pulmonary Infiltrates #Mild Intermittent Asthma #Mitral Regurgitation #History of [name]  Patient is an 82 year old female with a past medical history of asthma as well as report of [name] that was treated in 2002. She is presenting with increased shortness of breath after outpatient blood work showed an elevated BNP  as well as an elevated d-dimer.  She underwent a CT scan of the chest that did not note any pulmonary embolism. The CT was notable for consolidations in the left lower lobe as well as scattered tree-in-bud nodularity in the bilateral lungs. The right lower lobe nodule is chronic and unchanged. I have reviewed her previous CT imaging dating back to 2002. There was a cavitary nodule on the 2004 chest CT that transformed with  time into the above mentioned RLL nodule. Furthermore, CT imaging notes tree-in-bud nodularity in the bilateral lungs starting in 2018. Subsequent scans 2021 onwards shows bronchiectasis and partial collapse in the RUL.  Given this progression on chest imaging, the tree-in-bud nodularity and LLL consolidation could represent an atypical mycobacterial infection (non-tuberculous mycobacteria). This ought to be investigated with induced sputa x3 and sent for AFB and fungal cultures. Should this not grow any organism, I would consider flexible bronchoscopy for airway survey and BAL. The patient is currently on azithromycin which could yield negative cultures. Other differentials for said nodularity includes mucus impaction, viral illness, resolving bacterial pneumonia (given recent antibiotic course) with lag in radiographic clearance, and organizing pneumonia. Would recommend checking a viral panel, urinary legionella/strep antigens, and continuing antibiotics for CAP coverage. I don't suspect this represents a metastatic process (given very distant history of breast cancer) but she would benefit from repeat imaging in 3 to 6 months.  I would recommend an aggressive pulmonary toilet regimen as follows. Nebulized hypertonic saline twice daily, followed by nebulized albuterol twice daily, followed by at least 10 uses of a flutter device. Would recommend mobilization out of bed to chair and consultation to physical therapy. Finally, attention to the elevated BNP in the setting of history of MR and current murmur is advised. Patient could benefit from repeat echocardiography and consultation with cardiology as deemed appropriate by her primary team.  -sputum induction x 3 for AFB -pulmonary clearance regimen -ok to continue antibiotics -outpatient follow up with Dr. Marchelle Gearing -continue outpatient bronchodilator regimen  Raechel Chute, MD [facility] 08/09/2023 12:27 PM    Labs    CBC: Recent Labs  Lab 08/05/23 1114 08/09/23 0319  WBC 6.9 5.9  NEUTROABS 5.3 4.9  HGB 13.0 12.0  HCT 40.8 37.1  MCV 100.1* 101.6*  PLT 328.0 216    Basic Metabolic Panel: Recent Labs  Lab 08/05/23 1114 08/09/23 0319  NA 139 137  K 4.6 4.0  CL 101 100  CO2 31 28  GLUCOSE 92 112*  BUN 16 23  CREATININE 0.83 0.75  CALCIUM 9.6 8.6*   GFR: Estimated Creatinine Clearance: 44.4 mL/min (by C-G formula based on SCr of 0.75 mg/dL). Recent Labs  Lab 08/05/23 1114 08/09/23 0319  PROCALCITON  --  <0.10  WBC 6.9 5.9  LATICACIDVEN  --  1.2    Liver Function Tests: Recent Labs  Lab 08/05/23 1114  AST 13  ALT 2  ALKPHOS 98  BILITOT 1.0  PROT 7.6  ALBUMIN 3.7   No results for input(s): "LIPASE", "AMYLASE" in the last 168 hours. No results for input(s): "AMMONIA" in the last 168 hours.  ABG    Component Value Date/Time   PHART 7.43 02/12/2022 1128   PCO2ART 40 02/12/2022 1128   PO2ART 90 02/12/2022 1128   HCO3 26.5 02/12/2022 1128   O2SAT 98.7 02/12/2022 1128     Coagulation Profile: No results for input(s): "INR", "PROTIME" in the last 168 hours.  Cardiac Enzymes: No results for input(s): "CKTOTAL", "CKMB", "CKMBINDEX", "  TROPONINI" in the last 168 hours.  HbA1C: Hgb A1c MFr Bld  Date/Time Value Ref Range Status  01/14/2018 11:21 AM 5.3 4.6 - 6.5 % Final    Comment:    Glycemic Control Guidelines for People with Diabetes:Non Diabetic:  <6%Goal of Therapy: <7%Additional Action Suggested:  >8%   05/19/2014 03:58 PM 5.4 4.6 - 6.5 % Final    Comment:    Glycemic Control Guidelines for People with Diabetes:Non Diabetic:  <6%Goal of Therapy: <7%Additional Action Suggested:  >8%     CBG: No results for input(s): "GLUCAP" in the last 168 hours.  Review of Systems:   Review of Systems  Constitutional:  Positive for malaise/fatigue and weight loss. Negative for chills and fever.  Respiratory:  Positive for cough, sputum production and shortness of breath.  Negative for wheezing.      Past Medical History:  She,  has a past medical history of Aneurysm of aorta (HCC), Anginal pain (HCC), Arthritis, Asthma, Breast cancer (HCC) (2002), Cataract (2019), Chronic heart failure with preserved ejection fraction (HFpEF) (HCC), COPD (chronic obstructive pulmonary disease) (HCC) (07/2007), Coronary artery disease, Ductal carcinoma of breast, estrogen receptor positive, stage 1 (HCC) (09/16/2012), Endometriosis, GERD (gastroesophageal reflux disease), mitral valve repair, Hypercalcemia (09/14/2013), Hypertension, Lesion of breast (1992), Lung disease, Mastalgia (05/1994), Mitral regurgitation, Moderate mitral regurgitation by prior echocardiogram, Osteoporosis, post-menopausal (03/14/2012), Ovarian tumor of borderline malignancy, right (2004), Palpitations, Pancreatitis, Parkinson disease (02/10/2015), Post-thoracotomy pain syndrome, Pseudomonas pneumonia (HCC) (03/2008), Status post implantation of mitral valve leaflet clip (02/15/2022), Traumatic closed fracture of distal clavicle with minimal displacement, left, initial encounter (06/2021), Uterine fibroid, and Vitamin D deficiency.   Surgical History:   Past Surgical History:  Procedure Laterality Date   ABDOMINAL HYSTERECTOMY     & BSO for Mucinous borderline tumor of R ovary 2004   APPENDECTOMY  2004   BREAST BIOPSY  08/2004   right breast-benign   BREAST LUMPECTOMY  1992   benign   BUBBLE STUDY  12/27/2021   Procedure: BUBBLE STUDY;  Surgeon: Sande Rives, MD;  Location: [facility] ENDOSCOPY;  Service: Cardiovascular;;   CARDIAC CATHETERIZATION  2007   essentially negation for significant CAD   CATARACT EXTRACTION W/PHACO Left 07/24/2023   Procedure: CATARACT EXTRACTION PHACO AND INTRAOCULAR LENS PLACEMENT (IOC) LEFT 5.34 0037.3;  Surgeon: Lockie Mola, MD;  Location[facility]GERY CN[facility];  Service: Ophthalmology;  Laterality: Left;   CATARACT EXTRACTION W/PHACO Right 08/07/2023   Procedure:  CATARACT EXTRACTION PHACO AND INTRAOCULAR LENS PLACEMENT (IOC) RIGHT 7.83 00:47.9;  Surgeon: Lockie Mola, MD;  Location: [facility] SURGERY CNTR;  Service: Ophthalmology;  Laterality: Right;   CHOLECYSTECTOMY  2001   COLONOSCOPY  2014   Dr Marina Goodell , due 2019   COLONOSCOPY  09/2018   2 TA removed, int hem, no f/u needed (Dr Marina Goodell)    ESOPHAGOGASTRODUODENOSCOPY  09/2018   GERD with esophagitis and stricture dilated, antral gastritis negative for H pylori Marina Goodell)   HIP ARTHROPLASTY Left 08/21/2022   Procedure: ARTHROPLASTY BIPOLAR HIP (HEMIARTHROPLASTY);  Surgeon: Juanell Fairly, MD;  Location: [facility] ORS;  Service: Orthopedics;  Laterality: Left;   LUNG BIOPSY  2002   [name], Dr Edwyna Shell   MASTECTOMY MODIFIED RADICAL Left 2002   oral chemotheraphy (tamoxifen then Armidex) no radiation, Dr.[name]   MITRAL VALVE REPAIR N/A 02/15/2022   Procedure: MITRAL VALVE REPAIR;  Surgeon: Tonny Bollman, MD;  Location: [facility] INVASIVE CV LAB;  Service: Cardiovascular;  Laterality: N/A;   MITRAL VALVE REPAIR N/A 04/03/2023   Procedure: MITRAL VALVE  REPAIR;  Surgeon: Tonny Bollman, MD;  Location: [facility] INVASIVE CV LAB;  Service: Cardiovascular;  Laterality: N/A;   RIGHT/LEFT HEART CATH AND CORONARY ANGIOGRAPHY Bilateral 11/14/2021   Procedure: RIGHT/LEFT HEART CATH AND CORONARY ANGIOGRAPHY;  Surgeon: Iran Ouch, MD;  Location: [facility] INVASIVE CV LAB;  Service: Cardiovascular;  Laterality: Bilateral;   TEE WITHOUT CARDIOVERSION N/A 12/29/2018   Procedure: TRANSESOPHAGEAL ECHOCARDIOGRAM (TEE);  Surgeon: Iran Ouch, MD;  Location: [facility] ORS;  Service: Cardiovascular;  Laterality: N/A;   TEE WITHOUT CARDIOVERSION N/A 12/27/2021   Procedure: TRANSESOPHAGEAL ECHOCARDIOGRAM (TEE);  Surgeon: Sande Rives, MD;  Location: [facility] ENDOSCOPY;  Service: Cardiovascular;  Laterality: N/A;   TEE WITHOUT CARDIOVERSION N/A 02/15/2022   Procedure: TRANSESOPHAGEAL ECHOCARDIOGRAM (TEE);  Surgeon: Tonny Bollman, MD;   Location: [facility] INVASIVE CV LAB;  Service: Cardiovascular;  Laterality: N/A;   TEE WITHOUT CARDIOVERSION N/A 05/14/2022   Procedure: TRANSESOPHAGEAL ECHOCARDIOGRAM (TEE);  Surgeon: Sande Rives, MD;  Location: [facility] ENDOSCOPY;  Service: Cardiovascular;  Laterality: N/A;   TEE WITHOUT CARDIOVERSION N/A 04/03/2023   Procedure: TRANSESOPHAGEAL ECHOCARDIOGRAM;  Surgeon: Tonny Bollman, MD;  Location: [facility] INVASIVE CV LAB;  Service: Cardiovascular;  Laterality: N/A;   TONSILLECTOMY  1946     Social History:   reports that she has never smoked. She has never used smokeless tobacco. She reports that she does not drink alcohol and does not use drugs.   Family History:  Her family history includes Breast cancer in her cousin; Breast cancer (age of onset: 54) in her sister; Cancer in her maternal grandfather, maternal grandmother, maternal uncle, maternal uncle, and maternal uncle; Colon cancer in her sister; Colon cancer (age of onset: 46) in her cousin; Emphysema in her mother; Liver cancer in her maternal aunt; Lung cancer (age of onset: 69) in her cousin; Lung cancer (age of onset: 76) in her father; Melanoma in her maternal aunt; Myasthenia gravis in her paternal aunt; Osteoporosis in her paternal aunt; Parkinson's disease in her cousin; Stroke in her paternal aunt. There is no history of Diabetes, Heart disease, Stomach cancer, or Ulcerative colitis.   Allergies Allergies  Allergen Reactions   Sulfa Antibiotics Anaphylaxis   Sulfonamide Derivatives Anaphylaxis   Topamax [Topiramate] Other (See Comments)    Metabolic acidosis    Clarithromycin Other (See Comments)    pericarditis Other reaction(s): Not available   Hydrocodone Other (See Comments)    "hyper and climbing the walls"   Motrin [Ibuprofen] Other (See Comments)    headaches   Misc. Sulfonamide Containing Compounds     Other reaction(s): Not available   Symbicort [Budesonide-Formoterol Fumarate] Other (See Comments)    02/07/15  tremor   Tetracycline Hcl Other (See Comments)   Percocet [Oxycodone-Acetaminophen] Itching and Rash   Tape Itching and Rash    Use paper tape only   Tetracyclines & Related Other (See Comments)    "immediate yeast infection"     Home Medications  Prior to Admission medications   Medication Sig Start Date End Date Taking? Authorizing Provider  acetaminophen (TYLENOL) 500 MG tablet Take 1,000 mg by mouth 3 (three) times daily.   Yes [provider]  albuterol (VENTOLIN HFA) 108 (90 Base) MCG/ACT inhaler Inhale 2 puffs into the lungs every 6 (six) hours as needed for wheezing or shortness of breath. 06/04/22  Yes Eustaquio Boyden, MD  aspirin 81 MG tablet Take 1 tablet (81 mg total) by mouth daily. 01/27/19  Yes Eustaquio Boyden, MD  azithromycin (ZITHROMAX Z-PAK) 250 MG tablet Take  2 tablets (500 mg) on  Day 1,  followed by 1 tablet (250 mg) once daily on Days 2 through 5. 08/09/23 08/14/23 Yes Delton Prairie, MD  calcium carbonate (TUMS - DOSED IN MG ELEMENTAL CALCIUM) 500 MG chewable tablet Chew 2 tablets by mouth daily as needed for indigestion or heartburn.   Yes [provider]  Carbidopa-Levodopa ER (SINEMET CR) 25-100 MG tablet controlled release TAKE TWO TABLET BY MOUTH AT NINE IN THE MORNING, ONE TAB AT ONE IN THE EVENING AND ONE TAB AT FIVE IN THE EVENING 02/06/23  Yes Tat, Octaviano Batty, DO  cefdinir (OMNICEF) 300 MG capsule Take 1 capsule (300 mg total) by mouth 2 (two) times daily for 7 days. 08/09/23 08/16/23 Yes Delton Prairie, MD  Cholecalciferol (VITAMIN D3) 25 MCG (1000 UT) CAPS Take 1 capsule (1,000 Units total) by mouth daily. 09/27/22  Yes Eustaquio Boyden, MD  clonazePAM (KLONOPIN) 0.5 MG tablet TAKE ONE HALF (1/2) A TABLET BY MOUTH AT BEDTIME 05/09/23  Yes Hill, Manus Gunning, MD  diphenhydrAMINE (BENADRYL) 25 MG tablet Take 50 mg by mouth in the morning.   Yes [provider]  EPINEPHrine 0.3 mg/0.3 mL IJ SOAJ injection Inject 0.3 mg into the muscle as needed for  anaphylaxis. 02/25/20  Yes [provider]  FEROSUL 325 (65 Fe) MG tablet TAKE ONE TABLET BY MOUTH DAILY WITH BREAKFAST 02/07/23  Yes Eustaquio Boyden, MD  Fluticasone-Umeclidin-Vilant (TRELEGY ELLIPTA) 100-62.5-25 MCG/ACT AEPB Inhale 1 puff into the lungs daily. 04/27/22  Yes Cobb, Ruby Cola, NP  furosemide (LASIX) 40 MG tablet TAKE ONE TABLET BY MOUTH ONCE DAILY *NEW DOSE* 03/29/23  Yes Iran Ouch, MD  gabapentin ([name]) 300 MG capsule TAKE TWO CAPSULES BY MOUTH THREE TIMES DAILY 02/07/23  Yes Eustaquio Boyden, MD  pantoprazole (PROTONIX) 20 MG tablet Take 1 tablet (20 mg total) by mouth daily. 04/01/23  Yes Eustaquio Boyden, MD  potassium chloride SA (KLOR-CON M) 20 MEQ tablet TAKE ONE TABLET BY MOUTH ONCE A DAY 01/30/23  Yes Iran Ouch, MD  predniSONE (DELTASONE) 50 MG tablet Take 1 tablet (50 mg total) by mouth daily for 4 days. 08/09/23 08/13/23 Yes Delton Prairie, MD      I spent 85 minutes caring for this patient today, including preparing to see the patient, obtaining a medical history , reviewing a separately obtained history, performing a medically appropriate examination and/or evaluation, counseling and educating the patient/family/caregiver, ordering medications, tests, or procedures, referring and communicating with other health care professionals (not separately reported), documenting clinical information in the electronic health record, and independently interpreting results (not separately reported/billed) and communicating results to the patient/family/caregiver

## 2023-08-09 NOTE — Progress Notes (Signed)
RT note:  Induced sputum attempted.  Patient given nebulizer, followed with Flutter valve, and instruction on deep cough.  Patient with dry cough and no indication of secretions.

## 2023-08-09 NOTE — Assessment & Plan Note (Signed)
Continue home inhalers DuoNebs if needed

## 2023-08-09 NOTE — Assessment & Plan Note (Addendum)
History of PE in 2019 no longer on  Eliquis - took for three months CTA negative for PE

## 2023-08-09 NOTE — Progress Notes (Signed)
RT Note: Patient seen for induced sputum. Aerosol treatment given nebulizer. Flutter valve administered post treatment. Patient with dry non productive cough.  No sample at this time.

## 2023-08-09 NOTE — H&P (Addendum)
History and Physical    Patient: Crystal Bass OZH:[id number] DOB: 1941-08-25 DOA: 08/08/2023 DOS: the patient was seen and examined on 08/09/2023 PCP: Eustaquio Boyden, MD  Patient coming from: ALF/ILF  Chief Complaint:  Chief Complaint  Patient presents with   Abnormal Labs    HPI: Crystal Bass is a 82 y.o. female with medical history significant for Parkinson's disease, COPD, mitral regurgitation s/p MitraClip in 2023 with redo 03/2023, diastolic CHF history of PE in 6295 no longer on Eliquis, who was sent to the ED by her pulmonologist for further evaluation of an elevated D-dimer of 1.97 which was ordered after patient complained of persistent shortness of breath since having COVID 8 to 10 weeks prior(per review of pulmonology notes).  Patient reports she has been chronically short of breath for decades and her shortness of breath did not significantly improved after the second MitraClip.  States she felt like she was just a little worse than her baseline but her pulmonologist sent her in for evaluation due to the elevated D-dime and history of PE.  She denies chest pain.  She has chronic swelling in the lower extremities, left more than right which has not recently worsened .[name]  She denies cough, fever or chills. ED course and data review: Tachypneic to 27 with exertion and O2 sat down to 90 with exertion but vitals within normal limits when at rest. Labs: CBC unremarkable, lactic acid 1.2, BMP unremarkable, procalcitonin pending.Next line EKG, personally viewed and interpreted showing sinus at 72 with LVH CTA chest negative for PE but concerning for pneumonia and new nodular densities as further detailed below IMPRESSION: 1. No evidence for pulmonary embolism. 2. New patchy airspace disease throughout the left lower lobe and minimally in the left upper lobe worrisome for pneumonia. 3. New nodular densities in the left upper lobe and left lower lobe measuring up to 12 mm. Findings  may be infectious/inflammatory, but metastatic disease is not excluded. 4. New nodular densities in the right lower lobe measuring up to 10 mm. Findings may be infectious/inflammatory or neoplastic. 5. Stable right lower lobe nodule measuring 12 mm. 6. Stable ground-glass opacities throughout both lungs. 7. Stable moderate cardiomegaly. 8. Findings compatible with pulmonary artery hypertension.  Patient treated with Rocephin and azithromycin as well as methylprednisolone Hospitalist consulted for admission.   Review of Systems: As mentioned in the history of present illness. All other systems reviewed and are negative.  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 10/2021 Cath: Nl cors.   Arthritis    Asthma    Breast cancer (HCC) 2002   infiltrative ductal carcinoma    Cataract 2019   Chronic heart failure with preserved ejection fraction (HFpEF) (HCC)    COPD (chronic obstructive pulmonary disease) (HCC) 07/2007   Coronary artery disease    Ductal carcinoma of breast, estrogen receptor positive, stage 1 (HCC) 09/16/2012   Endometriosis    GERD (gastroesophageal reflux disease)    Hx of mitral valve repair    Hypercalcemia 09/14/2013   Hypertension    Lesion of breast 1992   right, benign   Lung disease    secondary to [name] infection   Mastalgia 05/1994   Mitral regurgitation    a. 10/2021 RHC: PCWP V wave of 34; b. 01/2022 TEE: Severe flail/prolapse of large area of A2 scallop with severe posteriorly  directed MR; c. 01/2022 TEER w /Mitraclip XTW x 2; d. 07/2022 Echo: EF 60-65%. No rwma, mild LVH, GrIII DD. Sev dil LA. Mod elev PASP. Mod-Sev MR. Mod MS.   Moderate mitral regurgitation by prior echocardiogram    Osteoporosis, post-menopausal 03/14/2012   Ovarian tumor of borderline malignancy, right 2004   Palpitations    a. 02/2022 Zio: Predominantly sinus rhythm at 67 (52-171).  2 runs of  NSVT -fastest 9 beats at 167.  35 SVT runs-longest 19 beats, fastest 171x6 beats.   Pancreatitis    secondary to Cholelithiasis   Parkinson disease 02/10/2015   Post-thoracotomy pain syndrome    Pseudomonas pneumonia (HCC) 03/2008   Status post implantation of mitral valve leaflet clip 02/15/2022   s/p XTW MitraClip x2 by Dr. Excell Seltzer in the A2/P2: b. 04/03/23 s/p third mitraclip implantation.   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   APPENDECTOMY  2004   BREAST BIOPSY  08/2004   right breast-benign   BREAST LUMPECTOMY  1992   benign   BUBBLE STUDY  12/27/2021   Procedure: BUBBLE STUDY;  Surgeon: Sande Rives, MD;  Location: [facility] ENDOSCOPY;  Service: Cardiovascular;;   CARDIAC CATHETERIZATION  2007   essentially negation for significant CAD   CATARACT EXTRACTION W/PHACO Left 07/24/2023   Procedure: CATARACT EXTRACTION PHACO AND INTRAOCULAR LENS PLACEMENT (IOC) LEFT 5.34 0037.3;  Surgeon: Lockie Mola, MD;  Location: [facility] SURGERY CNTR;  Service: Ophthalmology;  Laterality: Left;   CATARACT EXTRACTION W/PHACO Right 08/07/2023   Procedure: CATARACT EXTRACTION PHACO AND INTRAOCULAR LENS PLACEMENT (IOC) RIGHT 7.83 00:47.9;  Surgeon: Lockie Mola, MD;  Location: [facility] SURGERY CNTR;  Service: Ophthalmology;  Laterality: Right;   CHOLECYSTECTOMY  2001   COLONOSCOPY  2014   Dr Marina Goodell , due 2019   COLONOSCOPY  09/2018   2 TA removed, int hem, no f/u needed (Dr Marina Goodell)    ESOPHAGOGASTRODUODENOSCOPY  09/2018   GERD with esophagitis and stricture dilated, antral gastritis negative for H pylori Marina Goodell)   HIP ARTHROPLASTY Left 08/21/2022   Procedure: ARTHROPLASTY BIPOLAR HIP (HEMIARTHROPLASTY);  Surgeon: Juanell Fairly, MD;   Location: [facility] ORS;  Service: Orthopedics;  Laterality: Left;   LUNG BIOPSY  2002   [name], Dr Edwyna Shell   MASTECTOMY MODIFIED RADICAL Left 2002   oral chemotheraphy (tamoxifen then Armidex) no radiation, Dr.[name]   MITRAL VALVE REPAIR N/A 02/15/2022   Procedure: MITRAL VALVE REPAIR;  Surgeon: Tonny Bollman, MD;  Location: [facility] INVASIVE CV LAB;  Service: Cardiovascular;  Laterality: N/A;   MITRAL VALVE REPAIR N/A 04/03/2023   Procedure: MITRAL VALVE REPAIR;  Surgeon: Tonny Bollman, MD;  Location: [facility] INVASIVE CV LAB;  Service: Cardiovascular;  Laterality: N/A;   RIGHT/LEFT HEART CATH AND CORONARY ANGIOGRAPHY Bilateral 11/14/2021   Procedure: RIGHT/LEFT HEART CATH AND CORONARY ANGIOGRAPHY;  Surgeon: Iran Ouch, MD;  Location: [facility] INVASIVE CV LAB;  Service: Cardiovascular;  Laterality: Bilateral;   TEE WITHOUT CARDIOVERSION N/A 12/29/2018   Procedure: TRANSESOPHAGEAL ECHOCARDIOGRAM (TEE);  Surgeon: Iran Ouch, MD;  Location: [facility] ORS;  Service: Cardiovascular;  Laterality: N/A;   TEE WITHOUT CARDIOVERSION N/A 12/27/2021   Procedure: TRANSESOPHAGEAL ECHOCARDIOGRAM (TEE);  Surgeon: Sande Rives, MD;  Location: [facility] ENDOSCOPY;  Service: Cardiovascular;  Laterality: N/A;   TEE WITHOUT CARDIOVERSION N/A 02/15/2022   Procedure: TRANSESOPHAGEAL ECHOCARDIOGRAM (TEE);  Surgeon: Tonny Bollman, MD;  Location: [facility] INVASIVE CV LAB;  Service: Cardiovascular;  Laterality: N/A;   TEE WITHOUT CARDIOVERSION N/A 05/14/2022   Procedure: TRANSESOPHAGEAL ECHOCARDIOGRAM (TEE);  Surgeon: Sande Rives, MD;  Location: [facility] ENDOSCOPY;  Service: Cardiovascular;  Laterality: N/A;   TEE WITHOUT CARDIOVERSION N/A 04/03/2023   Procedure: TRANSESOPHAGEAL ECHOCARDIOGRAM;  Surgeon: Tonny Bollman, MD;  Location: [facility] INVASIVE CV LAB;  Service: Cardiovascular;  Laterality: N/A;   TONSILLECTOMY  1946   Social History:  reports that she has never smoked. She has never used smokeless tobacco. She reports that she  does not drink alcohol and does not use drugs.  Allergies  Allergen Reactions   Sulfa Antibiotics Anaphylaxis   Sulfonamide Derivatives Anaphylaxis   Topamax [Topiramate] Other (See Comments)    Metabolic acidosis    Clarithromycin Other (See Comments)    pericarditis Other reaction(s): Not available   Hydrocodone Other (See Comments)    "hyper and climbing the walls"   Motrin [Ibuprofen] Other (See Comments)    headaches   Misc. Sulfonamide Containing Compounds     Other reaction(s): Not available   Symbicort [Budesonide-Formoterol Fumarate] Other (See Comments)    02/07/15 tremor   Tetracycline Hcl Other (See Comments)   Percocet [Oxycodone-Acetaminophen] Itching and Rash   Tape Itching and Rash    Use paper tape only   Tetracyclines & Related Other (See Comments)    "immediate yeast infection"    Family History  Problem Relation Age of Onset   Emphysema Mother        d.64 was never a smoker   Lung cancer Father 40       d.82 history of smoking   Breast cancer Sister 35   Colon cancer Sister    Melanoma Maternal Aunt    Liver cancer Maternal Aunt    Cancer Maternal Uncle        unspecified type   Cancer Maternal Uncle        unspecified type   Cancer Maternal Uncle        unspecified type   Stroke Paternal Aunt        in mid 13s   Osteoporosis Paternal Aunt    Myasthenia gravis Paternal Aunt    Cancer Maternal Grandmother        d.82s unspecified GI cancer   Cancer Maternal Grandfather        d.62s unspecified type   Breast cancer Cousin        d.60s-daughter of unaffected paternal aunt Doristine Church   Colon cancer Cousin 60       d.70-daughter of unaffected maternal aunt Leila   Lung cancer Cousin 70       d.70-sisters to each other, both daughters of maternal uncle Johnny   Parkinson's disease Cousin    Diabetes Neg Hx    Heart disease Neg Hx    Stomach cancer Neg Hx    Ulcerative colitis Neg Hx     Prior to Admission medications   Medication Sig Start Date  End Date Taking? Authorizing Provider  azithromycin (ZITHROMAX Z-PAK) 250 MG tablet Take 2 tablets (500 mg) on  Day 1,  followed by 1 tablet (250 mg) once daily on Days 2 through 5. 08/09/23 08/14/23 Yes Delton Prairie, MD  cefdinir (OMNICEF) 300 MG capsule Take 1 capsule (300 mg total) by mouth 2 (two) times daily for 7 days. 08/09/23 08/16/23 Yes  Delton Prairie, MD  predniSONE (DELTASONE) 50 MG tablet Take 1 tablet (50 mg total) by mouth daily for 4 days. 08/09/23 08/13/23 Yes Delton Prairie, MD  acetaminophen (TYLENOL) 500 MG tablet Take 1,000 mg by mouth 3 (three) times daily.    [provider]  albuterol (VENTOLIN HFA) 108 (90 Base) MCG/ACT inhaler Inhale 2 puffs into the lungs every 6 (six) hours as needed for wheezing or shortness of breath. 06/04/22   Eustaquio Boyden, MD  aspirin 81 MG tablet Take 1 tablet (81 mg total) by mouth daily. 01/27/19   Eustaquio Boyden, MD  calcium carbonate (TUMS - DOSED IN MG ELEMENTAL CALCIUM) 500 MG chewable tablet Chew 2 tablets by mouth daily as needed for indigestion or heartburn.    [provider]  Carbidopa-Levodopa ER (SINEMET CR) 25-100 MG tablet controlled release TAKE TWO TABLET BY MOUTH AT NINE IN THE MORNING, ONE TAB AT ONE IN THE EVENING AND ONE TAB AT FIVE IN THE EVENING 02/06/23   Tat, Octaviano Batty, DO  Cholecalciferol (VITAMIN D3) 25 MCG (1000 UT) CAPS Take 1 capsule (1,000 Units total) by mouth daily. 09/27/22   Eustaquio Boyden, MD  clonazePAM (KLONOPIN) 0.5 MG tablet TAKE ONE HALF (1/2) A TABLET BY MOUTH AT BEDTIME 05/09/23   Antony Madura, MD  diphenhydrAMINE (BENADRYL) 25 MG tablet Take 50 mg by mouth in the morning.    [provider]  EPINEPHrine 0.3 mg/0.3 mL IJ SOAJ injection Inject 0.3 mg into the muscle as needed for anaphylaxis. 02/25/20   [provider]  FEROSUL 325 (65 Fe) MG tablet TAKE ONE TABLET BY MOUTH DAILY WITH BREAKFAST 02/07/23   Eustaquio Boyden, MD  Fluticasone-Umeclidin-Vilant (TRELEGY ELLIPTA)  100-62.5-25 MCG/ACT AEPB Inhale 1 puff into the lungs daily. 04/27/22   Cobb, Ruby Cola, NP  furosemide (LASIX) 40 MG tablet TAKE ONE TABLET BY MOUTH ONCE DAILY *NEW DOSE* 03/29/23   Iran Ouch, MD  gabapentin ([name]) 300 MG capsule TAKE TWO CAPSULES BY MOUTH THREE TIMES DAILY 02/07/23   Eustaquio Boyden, MD  pantoprazole (PROTONIX) 20 MG tablet Take 1 tablet (20 mg total) by mouth daily. 04/01/23   Eustaquio Boyden, MD  potassium chloride SA (KLOR-CON M) 20 MEQ tablet TAKE ONE TABLET BY MOUTH ONCE A DAY 01/30/23   Iran Ouch, MD    Physical Exam: Vitals:   08/09/23 0135 08/09/23 0200 08/09/23 0237 08/09/23 0315  BP:  139/83  (!) 147/108  Pulse:  73  72  Resp: 18  (!) 27 18  Temp:   98 F (36.7 C) 98 F (36.7 C)  TempSrc:   Oral Oral  SpO2: 93%  96% 94%   Physical Exam Vitals and nursing note reviewed.  Constitutional:      General: She is not in acute distress.    Comments: [place] female, conversational dyspnea.  HENT:     Head: Normocephalic and atraumatic.  Cardiovascular:     Rate and Rhythm: Normal rate and regular rhythm.     Heart sounds: Normal heart sounds.  Pulmonary:     Effort: Tachypnea and accessory muscle usage present.     Breath sounds: Normal breath sounds.     Comments: Coarse breath sounds Abdominal:     Palpations: Abdomen is soft.     Tenderness: There is no abdominal tenderness.  Musculoskeletal:     Right lower leg: 1+ Edema present.     Left lower leg: 2+ Edema present.  Neurological:     Mental Status: Mental  status is at baseline.     Labs on Admission: I have personally reviewed following labs and imaging studies  CBC: Recent Labs  Lab 08/05/23 1114 08/09/23 0319  WBC 6.9 5.9  NEUTROABS 5.3 4.9  HGB 13.0 12.0  HCT 40.8 37.1  MCV 100.1* 101.6*  PLT 328.0 216   Basic Metabolic Panel: Recent Labs  Lab 08/05/23 1114 08/09/23 0319  NA 139 137  K 4.6 4.0  CL 101 100  CO2 31 28  GLUCOSE 92 112*  BUN 16 23   CREATININE 0.83 0.75  CALCIUM 9.6 8.6*   GFR: Estimated Creatinine Clearance: 44.4 mL/min (by C-G formula based on SCr of 0.75 mg/dL). Liver Function Tests: Recent Labs  Lab 08/05/23 1114  AST 13  ALT 2  ALKPHOS 98  BILITOT 1.0  PROT 7.6  ALBUMIN 3.7   No results for input(s): "LIPASE", "AMYLASE" in the last 168 hours. No results for input(s): "AMMONIA" in the last 168 hours. Coagulation Profile: No results for input(s): "INR", "PROTIME" in the last 168 hours. Cardiac Enzymes: No results for input(s): "CKTOTAL", "CKMB", "CKMBINDEX", "TROPONINI" in the last 168 hours. BNP (last 3 results) Recent Labs    08/05/23 1114  PROBNP 484.0*   HbA1C: No results for input(s): "HGBA1C" in the last 72 hours. CBG: No results for input(s): "GLUCAP" in the last 168 hours. Lipid Profile: No results for input(s): "CHOL", "HDL", "LDLCALC", "TRIG", "CHOLHDL", "LDLDIRECT" in the last 72 hours. Thyroid Function Tests: No results for input(s): "TSH", "T4TOTAL", "FREET4", "T3FREE", "THYROIDAB" in the last 72 hours. Anemia Panel: No results for input(s): "VITAMINB12", "FOLATE", "FERRITIN", "TIBC", "IRON", "RETICCTPCT" in the last 72 hours. Urine analysis:    Component Value Date/Time   COLORURINE YELLOW 04/01/2023 0900   APPEARANCEUR CLEAR 04/01/2023 0900   LABSPEC 1.008 04/01/2023 0900   PHURINE 5.0 04/01/2023 0900   GLUCOSEU NEGATIVE 04/01/2023 0900   HGBUR SMALL (A) 04/01/2023 0900   BILIRUBINUR NEGATIVE 04/01/2023 0900   BILIRUBINUR negative 09/25/2022 1404   KETONESUR NEGATIVE 04/01/2023 0900   PROTEINUR NEGATIVE 04/01/2023 0900   UROBILINOGEN 0.2 09/25/2022 1404   NITRITE NEGATIVE 04/01/2023 0900   LEUKOCYTESUR TRACE (A) 04/01/2023 0900    Radiological Exams on Admission: CT Angio Chest PE W and/or Wo Contrast  Result Date: 08/09/2023 CLINICAL DATA:  Positive D-dimer. EXAM: CT ANGIOGRAPHY CHEST WITH CONTRAST TECHNIQUE: Multidetector CT imaging of the chest was performed using  the standard protocol during bolus administration of intravenous contrast. Multiplanar CT image reconstructions and MIPs were obtained to evaluate the vascular anatomy. RADIATION DOSE REDUCTION: This exam was performed according to the departmental dose-optimization program which includes automated exposure control, adjustment of the mA and/or kV according to patient size and/or use of iterative reconstruction technique. CONTRAST:  75mL OMNIPAQUE IOHEXOL 350 MG/ML SOLN COMPARISON:  CT angiogram chest 11/21/2022 FINDINGS: Cardiovascular: There is adequate opacification of the pulmonary arteries to the segmental level. There is no evidence for pulmonary embolism. Main pulmonary artery is enlarged compatible with pulmonary artery hypertension. The heart is moderately enlarged, unchanged. There is no pericardial effusion. The aorta is ectatic without focal dilatation. There surgical changes in the heart. Mediastinum/Nodes: There are prominent, but nonenlarged left hilar lymph nodes. No other enlarged mediastinal lymph nodes are seen. Visualized esophagus and thyroid gland are within normal limits. Lungs/Pleura: Focal opacity in the medial right upper lobe near surgical staple line appears unchanged from the prior examination and, likely representing postoperative change. There is new patchy airspace opacity throughout the left lower lobe  and minimally in the left upper lobe. There are numerous new nodular densities in the left upper lobe and left lower lobe in the region of airspace disease measuring 12 mm or less. Focal right lower lobe nodule which is slightly spiculated measuring up to 12 mm image 5/67 appears unchanged from prior. There also few scattered new nodular densities in the right lower lobe measuring up to 10 mm. Multifocal ground-glass opacities are seen throughout both lungs, similar to prior. There is no pleural effusion or pneumothorax. Upper Abdomen: There are calcifications in the right kidney.  Musculoskeletal: Left mastectomy changes. There surgical clips in the left axilla. No acute osseous abnormality. Review of the MIP images confirms the above findings. IMPRESSION: 1. No evidence for pulmonary embolism. 2. New patchy airspace disease throughout the left lower lobe and minimally in the left upper lobe worrisome for pneumonia. 3. New nodular densities in the left upper lobe and left lower lobe measuring up to 12 mm. Findings may be infectious/inflammatory, but metastatic disease is not excluded. 4. New nodular densities in the right lower lobe measuring up to 10 mm. Findings may be infectious/inflammatory or neoplastic. 5. Stable right lower lobe nodule measuring 12 mm. 6. Stable ground-glass opacities throughout both lungs. 7. Stable moderate cardiomegaly. 8. Findings compatible with pulmonary artery hypertension. Electronically Signed   By: Darliss Cheney M.D.   On: 08/09/2023 00:05     Data Reviewed: Relevant notes from primary care and specialist visits, past discharge summaries as available in EHR, including Care Everywhere. Prior diagnostic testing as pertinent to current admission diagnoses Updated medications and problem lists for reconciliation ED course, including vitals, labs, imaging, treatment and response to treatment Triage notes, nursing and pharmacy notes and ED provider's notes Notable results as noted in HPI   Assessment and Plan: * CAP (community acquired pneumonia) Rocephin and azithromycin Incentive spirometer Antitussives if needed Supplemental O2 if needed  Dyspnea on exertion, acute on chronic Patient had COVID a couple months prior, per review of pulmonology notes Ruled out for PE with CTA chest Possible CAP but suspecting multifactorial related to COPD, severe mitral valve regurg, new pulmonary nodules Will treat CAP as outlined on the specific problem Supplemental oxygen if needed Continue to follow-up with primary pulmonologist and  cardiologist  COPD (chronic obstructive pulmonary disease) (HCC) Continue home inhalers DuoNebs if needed  History of pulmonary embolism History of PE in 2019 no longer on  Eliquis - took for three months CTA negative for PE  S/P mitral valve clip implantation S/p MitraClip's x 2 due to inadequate symptomatic improvement after first MitraClip in 2023 No acute issues suspected Follow-up with cardiologist  Chronic heart failure with preserved ejection fraction (HFpEF) (HCC) Clinically euvolemic Continue Lasix  Parkinson disease Continue Sinemet  Frailty PT eval Ambulates with rollator/walker at baseline    DVT prophylaxis: Lovenox  Consults: none  Advance Care Planning:   Code Status: Limited: Do not attempt resuscitation (DNR) -DNR-LIMITED -Do Not Intubate/DNI    Family Communication: none  Disposition Plan: Back to previous home environment  Severity of Illness: The appropriate patient status for this patient is OBSERVATION. Observation status is judged to be reasonable and necessary in order to provide the required intensity of service to ensure the patient's safety. The patient's presenting symptoms, physical exam findings, and initial radiographic and laboratory data in the context of their medical condition is felt to place them at decreased risk for further clinical deterioration. Furthermore, it is anticipated that the patient will be  medically stable for discharge from the hospital within 2 midnights of admission.   Author: Andris Baumann, MD 08/09/2023 3:56 AM  For on call review [email or web address].

## 2023-08-09 NOTE — ED Notes (Signed)
Patient ambulatory O2 sat dropped to 90% on room air, did improve upon resting again. Work of breathing did increase while walking and after resting for a minute.

## 2023-08-09 NOTE — Assessment & Plan Note (Signed)
Clinically euvolemic Continue Lasix

## 2023-08-09 NOTE — Progress Notes (Signed)
Patient sister-in-law and brother at bedside. Came in with shortness of breath. She had outpatient labs which showed elevated D dimer. Was suggested by outpatient pulmonologist to come to the ER. Patient CT chest was negative for PE how were does have chronic changes from previous lung infection with [name] and changes of bronchiectasis in the past. Seen by pulmonary Dr.Dgayli and recommends AFB sputum times three induced if possible, continue azithromycin and Rocephin for now switch to oral antibiotic at discharge and follow-up with outpatient pulmonologist.  Patient overall feels better. She does not use chronic home oxygen. She follows with [place] cardiology had recent echo done in June will hold off on it. Continues to take her Lasix daily. Does not appear volume overloaded on clinical exam other than puffy legs. If remains stable will discharge tomorrow back to [facility] with outpatient follow-up pulmonary. Patient and family agreeable.

## 2023-08-09 NOTE — Assessment & Plan Note (Signed)
Continue Sinemet ?

## 2023-08-09 NOTE — Plan of Care (Signed)
Pt is alert, denies any c/o pain. Pt has dyspnea with exertion, tachypnea and admitted for elevated Ddimer no PE on C. Pt has some possible PNA and POC is IV ABX. Pt get up with FWW with one assist.  Problem: Education: Goal: Verbalization of understanding the information provided (i.e., activity precautions, restrictions, etc) will improve Outcome: Progressing Goal: Individualized Educational Video(s) Outcome: Progressing   Problem: Activity: Goal: Ability to ambulate and perform ADLs will improve Outcome: Progressing   Problem: Clinical Measurements: Goal: Postoperative complications will be avoided or minimized Outcome: Progressing   Problem: Self-Concept: Goal: Ability to maintain and perform role responsibilities to the fullest extent possible will improve Outcome: Progressing   Problem: Pain Management: Goal: Pain level will decrease Outcome: Progressing   Problem: Activity: Goal: Ability to tolerate increased activity will improve Outcome: Progressing   Problem: Clinical Measurements: Goal: Ability to maintain a body temperature in the normal range will improve Outcome: Progressing   Problem: Respiratory: Goal: Ability to maintain adequate ventilation will improve Outcome: Progressing Goal: Ability to maintain a clear airway will improve Outcome: Progressing   Problem: Education: Goal: Knowledge of General Education information will improve Description: Including pain rating scale, medication(s)/side effects and non-pharmacologic comfort measures Outcome: Progressing   Problem: Health Behavior/Discharge Planning: Goal: Ability to manage health-related needs will improve Outcome: Progressing   Problem: Clinical Measurements: Goal: Ability to maintain clinical measurements within normal limits will improve Outcome: Progressing Goal: Will remain free from infection Outcome: Progressing Goal: Diagnostic test results will improve Outcome: Progressing Goal:  Respiratory complications will improve Outcome: Progressing Goal: Cardiovascular complication will be avoided Outcome: Progressing   Problem: Activity: Goal: Risk for activity intolerance will decrease Outcome: Progressing   Problem: Nutrition: Goal: Adequate nutrition will be maintained Outcome: Progressing   Problem: Coping: Goal: Level of anxiety will decrease Outcome: Progressing   Problem: Elimination: Goal: Will not experience complications related to bowel motility Outcome: Progressing Goal: Will not experience complications related to urinary retention Outcome: Progressing   Problem: Pain Managment: Goal: General experience of comfort will improve Outcome: Progressing   Problem: Safety: Goal: Ability to remain free from injury will improve Outcome: Progressing   Problem: Skin Integrity: Goal: Risk for impaired skin integrity will decrease Outcome: Progressing

## 2023-08-09 NOTE — Assessment & Plan Note (Signed)
Rocephin and azithromycin Incentive spirometer Antitussives if needed Supplemental O2 if needed

## 2023-08-09 NOTE — Discharge Instructions (Addendum)
Crystal Bass has a pneumonia on CT scan.  No blood clots.  Discharged with antibiotics and steroids  Return to the ED if she were to worsen

## 2023-08-09 NOTE — Assessment & Plan Note (Signed)
S/p MitraClip's x 2 due to inadequate symptomatic improvement after first MitraClip in 2023 No acute issues suspected Follow-up with cardiologist

## 2023-08-09 NOTE — Assessment & Plan Note (Addendum)
PT eval Ambulates with rollator/walker at baseline

## 2023-08-10 DIAGNOSIS — R0609 Other forms of dyspnea: Secondary | ICD-10-CM | POA: Diagnosis not present

## 2023-08-10 DIAGNOSIS — J189 Pneumonia, unspecified organism: Secondary | ICD-10-CM | POA: Diagnosis not present

## 2023-08-10 DIAGNOSIS — G20A1 Parkinson's disease without dyskinesia, without mention of fluctuations: Secondary | ICD-10-CM

## 2023-08-10 DIAGNOSIS — J449 Chronic obstructive pulmonary disease, unspecified: Secondary | ICD-10-CM | POA: Diagnosis not present

## 2023-08-10 DIAGNOSIS — R918 Other nonspecific abnormal finding of lung field: Secondary | ICD-10-CM | POA: Diagnosis not present

## 2023-08-10 NOTE — Discharge Summary (Signed)
Physician Discharge Summary   Patient: Crystal Bass MRN: [id number] DOB: 1941-11-10  Admit date:     08/08/2023  Discharge date: 08/10/23  Discharge Physician: Enedina Finner   PCP: Eustaquio Boyden, MD   Recommendations at discharge:    F/u PCP in 1-2 weeks F/u with Dr Marchelle Gearing Pulmonary in 1 week-   Discharge Diagnoses: Principal Problem:   CAP (community acquired pneumonia) Active Problems:   Dyspnea on exertion, acute on chronic   COPD (chronic obstructive pulmonary disease) (HCC)   [name] (mycobacterium avium-intracellulare) infection (HCC)   Pulmonary nodules   History of pulmonary embolism   Severe mitral regurgitation   S/P mitral valve clip implantation   Chronic heart failure with preserved ejection fraction (HFpEF) (HCC)   Parkinson disease   Frailty   Crystal Bass is a 82 y.o. female with medical history significant for Parkinson's disease, COPD, mitral regurgitation s/p MitraClip in 2023 with redo 03/2023, diastolic CHF history of PE in 7829 no longer on Eliquis, who was sent to the ED by her pulmonologist for further evaluation of an elevated D-dimer of 1.97 which was ordered after patient complained of persistent shortness of breath since having COVID 8 to 10 weeks prior(per review of pulmonology notes).  CTA chest negative for PE but concerning for pneumonia and new nodular densities as further detailed below IMPRESSION: 1. No evidence for pulmonary embolism. 2. New patchy airspace disease throughout the left lower lobe and minimally in the left upper lobe worrisome for pneumonia. 3. New nodular densities in the left upper lobe and left lower lobe measuring up to 12 mm. Findings may be infectious/inflammatory, but metastatic disease is not excluded. 4. New nodular densities in the right lower lobe measuring up to 10 mm. Findings may be infectious/inflammatory or neoplastic. 5. Stable right lower lobe nodule measuring 12 mm. 6. Stable ground-glass  opacities throughout both lungs. 7. Stable moderate cardiomegaly. 8. Findings compatible with pulmonary artery hypertension.   CAP (community acquired pneumonia) Rocephin and azithromycin-- change to PO antibiotic Incentive spirometer Antitussives if needed Supplemental O2 if needed patient sats remain more than 92% on room air   Dyspnea on exertion, acute on chronic Patient had COVID a couple months prior, per review of pulmonology notes Ruled out for PE with CTA chest Possible CAP but suspecting multifactorial related to COPD, severe mitral valve regurg, new pulmonary nodules Will treat CAP as outlined on the specific problem pulmonary consultation with Dr.Dgayli-- recommends antibiotic, induced sputum which patient was not able to produce with RT, follow-up Dr. Marchelle Gearing as outpatient   COPD (chronic obstructive pulmonary disease) (HCC) Continue home inhalers DuoNebs if needed   History of pulmonary embolism History of PE in 2019 no longer on  Eliquis - took for three months CTA negative for PE this admission   S/P mitral valve clip implantation S/p MitraClip's x 2 due to inadequate symptomatic improvement after first MitraClip in 2023 No acute issues suspected Follow-up with cardiologist   Chronic heart failure with preserved ejection fraction (HFpEF) (HCC) Clinically euvolemic Continue Lasix   Parkinson disease Continue Sinemet   Frailty Ambulates with rollator/walker at baseline  Status post cataract surgery -- continue eyedrops prescribed by Dr. Sharman Crate   Overall appears at baseline. Patient will discharge back to [facility] with outpatient follow-up with Dr. Marchelle Gearing. She will make appointment according to her. Discharge plan discussed with patient's brother Loraine Leriche on the phone        Consultants: pulmonary Disposition: [facility] Diet recommendation:  Discharge Diet Orders (From admission, onward)     Start     Ordered   08/10/23 0000   Diet - low sodium heart healthy        08/10/23 1214            DISCHARGE MEDICATION: Allergies as of 08/10/2023       Reactions   Sulfa Antibiotics Anaphylaxis   Sulfonamide Derivatives Anaphylaxis   Topamax [topiramate] Other (See Comments)   Metabolic acidosis    Clarithromycin Other (See Comments)   pericarditis Other reaction(s): Not available   Hydrocodone Other (See Comments)   "hyper and climbing the walls"   Motrin [ibuprofen] Other (See Comments)   headaches   Misc. Sulfonamide Containing Compounds    Other reaction(s): Not available   Symbicort [budesonide-formoterol Fumarate] Other (See Comments)   02/07/15 tremor   Tetracycline Hcl Other (See Comments)   Percocet [oxycodone-acetaminophen] Itching, Rash   Tape Itching, Rash   Use paper tape only   Tetracyclines & Related Other (See Comments)   "immediate yeast infection"        Medication List     TAKE these medications    acetaminophen 500 MG tablet Commonly known as: TYLENOL Take 1,000 mg by mouth 3 (three) times daily.   albuterol 108 (90 Base) MCG/ACT inhaler Commonly known as: VENTOLIN HFA Inhale 2 puffs into the lungs every 6 (six) hours as needed for wheezing or shortness of breath.   aspirin 81 MG tablet Take 1 tablet (81 mg total) by mouth daily.   azithromycin 250 MG tablet Commonly known as: Zithromax Z-Pak Take 2 tablets (500 mg) on  Day 1,  followed by 1 tablet (250 mg) once daily on Days 2 through 5.   calcium carbonate 500 MG chewable tablet Commonly known as: TUMS - dosed in mg elemental calcium Chew 2 tablets by mouth daily as needed for indigestion or heartburn.   Carbidopa-Levodopa ER 25-100 MG tablet controlled release Commonly known as: SINEMET CR TAKE TWO TABLET BY MOUTH AT NINE IN THE MORNING, ONE TAB AT ONE IN THE EVENING AND ONE TAB AT FIVE IN THE EVENING   cefdinir 300 MG capsule Commonly known as: OMNICEF Take 1 capsule (300 mg total) by mouth 2 (two) times daily  for 7 days.   clonazePAM 0.5 MG tablet Commonly known as: KLONOPIN TAKE ONE HALF (1/2) A TABLET BY MOUTH AT BEDTIME   diphenhydrAMINE 25 MG tablet Commonly known as: BENADRYL Take 50 mg by mouth in the morning.   EPINEPHrine 0.3 mg/0.3 mL Soaj injection Commonly known as: EPI-PEN Inject 0.3 mg into the muscle as needed for anaphylaxis.   FeroSul 325 (65 Fe) MG tablet Generic drug: ferrous sulfate TAKE ONE TABLET BY MOUTH DAILY WITH BREAKFAST   furosemide 40 MG tablet Commonly known as: LASIX TAKE ONE TABLET BY MOUTH ONCE DAILY *NEW DOSE*   gabapentin 300 MG capsule Commonly known as: [name] TAKE TWO CAPSULES BY MOUTH THREE TIMES DAILY   pantoprazole 20 MG tablet Commonly known as: Protonix Take 1 tablet (20 mg total) by mouth daily.   potassium chloride SA 20 MEQ tablet Commonly known as: KLOR-CON M TAKE ONE TABLET BY MOUTH ONCE A DAY   Prednisolon-Moxiflox-Bromfenac 1-0.5-0.075 % Soln Place 1 drop into the right eye 4 (four) times daily.   Prednisolon-Moxiflox-Bromfenac 1-0.5-0.075 % Soln Place 1 drop into the left eye 2 (two) times daily.   predniSONE 50 MG tablet Commonly known as: DELTASONE Take 1 tablet (50 mg total) by  mouth daily for 4 days.   Trelegy Ellipta 100-62.5-25 MCG/ACT Aepb Generic drug: Fluticasone-Umeclidin-Vilant Inhale 1 puff into the lungs daily.   Vitamin D3 25 MCG (1000 UT) Caps Take 1 capsule (1,000 Units total) by mouth daily.        Follow-up Information     Eustaquio Boyden, MD .   Specialty: [facility] Medicine Contact information: [address] [place] [address] [phone]         [facility] Emergency Department at [facility] .   Specialty: Emergency Medicine Why: As needed, If symptoms worsen Contact information: [address] [place] [address] [phone]        Kalman Shan, MD. Call in 1 week(s).   Specialty: Pulmonary Disease Why: pt instructed to call  Pulmonary office for f/u appt Contact information: [address] [place] [address] [phone]                 Condition at discharge: fair  The results of significant diagnostics from this hospitalization (including imaging, microbiology, ancillary and laboratory) are listed below for reference.   Imaging Studies: CT Angio Chest PE W and/or Wo Contrast  Result Date: 08/09/2023 CLINICAL DATA:  Positive D-dimer. EXAM: CT ANGIOGRAPHY CHEST WITH CONTRAST TECHNIQUE: Multidetector CT imaging of the chest was performed using the standard protocol during bolus administration of intravenous contrast. Multiplanar CT image reconstructions and MIPs were obtained to evaluate the vascular anatomy. RADIATION DOSE REDUCTION: This exam was performed according to the departmental dose-optimization program which includes automated exposure control, adjustment of the mA and/or kV according to patient size and/or use of iterative reconstruction technique. CONTRAST:  75mL OMNIPAQUE IOHEXOL 350 MG/ML SOLN COMPARISON:  CT angiogram chest 11/21/2022 FINDINGS: Cardiovascular: There is adequate opacification of the pulmonary arteries to the segmental level. There is no evidence for pulmonary embolism. Main pulmonary artery is enlarged compatible with pulmonary artery hypertension. The heart is moderately enlarged, unchanged. There is no pericardial effusion. The aorta is ectatic without focal dilatation. There surgical changes in the heart. Mediastinum/Nodes: There are prominent, but nonenlarged left hilar lymph nodes. No other enlarged mediastinal lymph nodes are seen. Visualized esophagus and thyroid gland are within normal limits. Lungs/Pleura: Focal opacity in the medial right upper lobe near surgical staple line appears unchanged from the prior examination and, likely representing postoperative change. There is new patchy airspace opacity throughout the left lower lobe and minimally in the left  upper lobe. There are numerous new nodular densities in the left upper lobe and left lower lobe in the region of airspace disease measuring 12 mm or less. Focal right lower lobe nodule which is slightly spiculated measuring up to 12 mm image 5/67 appears unchanged from prior. There also few scattered new nodular densities in the right lower lobe measuring up to 10 mm. Multifocal ground-glass opacities are seen throughout both lungs, similar to prior. There is no pleural effusion or pneumothorax. Upper Abdomen: There are calcifications in the right kidney. Musculoskeletal: Left mastectomy changes. There surgical clips in the left axilla. No acute osseous abnormality. Review of the MIP images confirms the above findings. IMPRESSION: 1. No evidence for pulmonary embolism. 2. New patchy airspace disease throughout the left lower lobe and minimally in the left upper lobe worrisome for pneumonia. 3. New nodular densities in the left upper lobe and left lower lobe measuring up to 12 mm. Findings may be infectious/inflammatory, but metastatic disease is not excluded. 4. New nodular densities in the  right lower lobe measuring up to 10 mm. Findings may be infectious/inflammatory or neoplastic. 5. Stable right lower lobe nodule measuring 12 mm. 6. Stable ground-glass opacities throughout both lungs. 7. Stable moderate cardiomegaly. 8. Findings compatible with pulmonary artery hypertension. Electronically Signed   By: Darliss Cheney M.D.   On: 08/09/2023 00:05    Microbiology: Results for orders placed or performed during the hospital encounter of 08/08/23  Blood culture (routine x 2)     Status: None (Preliminary result)   Collection Time: 08/09/23  3:19 AM   Specimen: BLOOD  Result Value Ref Range Status   Specimen Description BLOOD BLOOD LEFT ARM  Final   Special Requests   Final    BOTTLES DRAWN AEROBIC AND ANAEROBIC Blood Culture adequate volume   Culture   Final    NO GROWTH 1 DAY Performed at [facility], [address], [place], [place] [address]    Report Status PENDING  Incomplete  Blood culture (routine x 2)     Status: None (Preliminary result)   Collection Time: 08/09/23  3:21 AM   Specimen: BLOOD  Result Value Ref Range Status   Specimen Description BLOOD BLOOD LEFT HAND  Final   Special Requests   Final    BOTTLES DRAWN AEROBIC AND ANAEROBIC Blood Culture adequate volume   Culture   Final    NO GROWTH 1 DAY Performed at [facility], [address], [place], [place] [address]    Report Status PENDING  Incomplete   *Note: Due to a large number of results and/or encounters for the requested time period, some results have not been displayed. A complete set of results can be found in Results Review.    Labs: CBC: Recent Labs  Lab 08/05/23 1114 08/09/23 0319  WBC 6.9 5.9  NEUTROABS 5.3 4.9  HGB 13.0 12.0  HCT 40.8 37.1  MCV 100.1* 101.6*  PLT 328.0 216   Basic Metabolic Panel: Recent Labs  Lab 08/05/23 1114 08/09/23 0319  NA 139 137  K 4.6 4.0  CL 101 100  CO2 31 28  GLUCOSE 92 112*  BUN 16 23  CREATININE 0.83 0.75  CALCIUM 9.6 8.6*   Liver Function Tests: Recent Labs  Lab 08/05/23 1114  AST 13  ALT 2  ALKPHOS 98  BILITOT 1.0  PROT 7.6  ALBUMIN 3.7    Discharge time spent: greater than 30 minutes.  Signed: Enedina Finner, MD [facility] 08/10/2023

## 2023-08-10 NOTE — Plan of Care (Signed)
  Problem: Pain Management: Goal: Pain level will decrease Outcome: Progressing   Problem: Education: Goal: Knowledge of General Education information will improve Description: Including pain rating scale, medication(s)/side effects and non-pharmacologic comfort measures Outcome: Progressing   Problem: Clinical Measurements: Goal: Diagnostic test results will improve Outcome: Progressing Goal: Respiratory complications will improve Outcome: Progressing   Problem: Activity: Goal: Risk for activity intolerance will decrease Outcome: Progressing   Problem: Coping: Goal: Level of anxiety will decrease Outcome: Progressing   Problem: Pain Managment: Goal: General experience of comfort will improve Outcome: Progressing

## 2023-08-10 NOTE — TOC Initial Note (Signed)
Transition of Care [facility]) - Initial/Assessment Note    Patient Details  Name: Crystal Bass MRN: [id number] Date of Birth: 1941/09/12  [facility]) CM/SW Contact:    Allena Katz, LCSW Phone Number: 08/10/2023, 9:27 AM  Clinical Narrative:  Pt admitted from [facility]. No anticipated discharge needs. TOC will continue to follow.                  Expected Discharge Plan:  ([facility]) Barriers to Discharge: Continued Medical Work up   Patient Goals and CMS Choice Patient states their goals for this hospitalization and ongoing recovery are:: return to [facility]          Expected Discharge Plan and Services                                              Prior Living Arrangements/Services     Patient language and need for interpreter reviewed:: Yes                 Activities of Daily Living Home Assistive Devices/Equipment: Eyeglasses, Environmental consultant (specify type) ADL Screening (condition at time of admission) Patient's cognitive ability adequate to safely complete daily activities?: Yes Is the patient deaf or have difficulty hearing?: No Does the patient have difficulty seeing, even when wearing glasses/contacts?: No Does the patient have difficulty concentrating, remembering, or making decisions?: No Patient able to express need for assistance with ADLs?: Yes Does the patient have difficulty dressing or bathing?: No Independently performs ADLs?: No Communication: Independent Dressing (OT): Independent Grooming: Independent Feeding: Independent Bathing: Needs assistance Is this a change from baseline?: Pre-admission baseline Toileting: Needs assistance Is this a change from baseline?: Pre-admission baseline In/Out Bed: Independent with device (comment) Is this a change from baseline?: Pre-admission baseline Walks in Home: Independent with device (comment) Is this a change from baseline?: Pre-admission baseline Does the patient  have difficulty walking or climbing stairs?: No Weakness of Legs: None Weakness of Arms/Hands: None  Permission Sought/Granted                  Emotional Assessment       Orientation: : Oriented to Self, Oriented to Place, Oriented to  Time, Oriented to Situation      Admission diagnosis:  CAP (community acquired pneumonia) [J18.9] Community acquired pneumonia of left lower lobe of lung [J18.9] Patient Active Problem List   Diagnosis Date Noted   Pulmonary nodules 08/09/2023   Frailty 08/09/2023   COVID-19 07/08/2023   Chronic heart failure with preserved ejection fraction (HFpEF) (HCC) 04/04/2023   Hypervolemia 02/08/2023   Chronic diastolic (congestive) heart failure (HCC) 08/20/2022   Closed left hip fracture (HCC) 08/19/2022   Severe mitral regurgitation 02/15/2022   S/P mitral valve clip implantation 02/15/2022   Ovarian tumor of borderline malignancy, right 10/28/2020   Angioedema 05/04/2020   Chronic neuropathic pain 01/27/2020   Acute midline low back pain with right-sided sciatica 10/26/2019   [name] (mycobacterium avium-intracellulare) infection (HCC) 11/01/2018   History of pulmonary embolism 10/31/2018   Orthostatic hypotension 08/25/2018   Chronic fatigue 10/23/2017   Parkinson disease 01/10/2016   Hypercalcemia 09/14/2013   Osteoporosis, post-menopausal 03/14/2012   Dyspnea on exertion, acute on chronic 03/28/2011   CAP (community acquired pneumonia) 09/27/2009   COPD (chronic obstructive pulmonary disease) (HCC) 03/25/2008   History of left breast cancer  03/10/2008   Essential hypertension 02/10/2007   PCP:  Eustaquio Boyden, MD Pharmacy:   [facility] - [place], [place] - [address] [place] [address] Phone: [phone] Fax: [phone]     Social Determinants of Health (SDOH) Social History: SDOH Screenings   Food Insecurity: No Food Insecurity (08/09/2023)  Housing: Low Risk   (08/09/2023)  Transportation Needs: No Transportation Needs (08/09/2023)  Utilities: Not At Risk (08/09/2023)  Alcohol Screen: Low Risk  (08/13/2022)  Depression (PHQ2-9): Low Risk  (12/07/2022)  Financial Resource Strain: Low Risk  (08/13/2022)  Physical Activity: Insufficiently Active (08/13/2022)  Social Connections: Socially Isolated (08/13/2022)  Stress: No Stress Concern Present (08/13/2022)  Tobacco Use: Low Risk  (08/09/2023)   SDOH Interventions:     Readmission Risk Interventions     No data to display

## 2023-08-10 NOTE — TOC Transition Note (Signed)
Transition of Care [facility]) - CM/SW Discharge Note   Patient Details  Name: Crystal Bass MRN: [id number] Date of Birth: [birth year]  Transition [facility] Care [facility]) CM/SW Contact:  Allena Katz, LCSW Phone Number: 08/10/2023, 11:55 AM   Clinical Narrative:   Pt to discharge back to [facility] today. Family to transport at 4. No other needs at this time.    Final next level of care:  ([facility]) Barriers to Discharge: Continued Medical Work up   Patient Goals and CMS Choice      Discharge Placement                         Discharge Plan and Services Additional resources added to the After Visit Summary for                                       Social Determinants of Health (SDOH) Interventions SDOH Screenings   Food Insecurity: No Food Insecurity (08/09/2023)  Housing: Low Risk  (08/09/2023)  Transportation Needs: No Transportation Needs (08/09/2023)  Utilities: Not At Risk (08/09/2023)  Alcohol Screen: Low Risk  (08/13/2022)  Depression (PHQ2-9): Low Risk  (12/07/2022)  Financial Resource Strain: Low Risk  (08/13/2022)  Physical Activity: Insufficiently Active (08/13/2022)  Social Connections: Socially Isolated (08/13/2022)  Stress: No Stress Concern Present (08/13/2022)  Tobacco Use: Low Risk  (08/09/2023)     Readmission Risk Interventions     No data to display

## 2023-08-10 NOTE — Progress Notes (Signed)
Mobility Specialist - Progress Note   08/10/23 0856  Mobility  Activity Ambulated with assistance in hallway;Stood at bedside;Dangled on edge of bed  Level of Assistance Standby assist, set-up cues, supervision of patient - no hands on  Assistive Device Front wheel walker  Distance Ambulated (ft) 160 ft  Activity Response Tolerated well  Mobility Referral Yes  $Mobility charge 1 Mobility  Mobility Specialist Start Time (ACUTE ONLY) [id number]  Mobility Specialist Stop Time (ACUTE ONLY) 0843  Mobility Specialist Time Calculation (min) (ACUTE ONLY) 22 min   Pt supine in bed on RA upon arrival. Pt completes bed mobility HHA with extra time. Pt STS and ambulates in hallway SBA with no LOB noted but does need VC for RW management throughout. Pt returns to bed with needs in reach, bed alarm activated, and brother present.   Terrilyn Saver  Mobility Specialist  08/10/23 8:58 AM

## 2023-08-12 ENCOUNTER — Telehealth: Payer: Self-pay | Admitting: *Deleted

## 2023-08-12 DIAGNOSIS — I5033 Acute on chronic diastolic (congestive) heart failure: Secondary | ICD-10-CM | POA: Diagnosis not present

## 2023-08-12 DIAGNOSIS — R4189 Other symptoms and signs involving cognitive functions and awareness: Secondary | ICD-10-CM | POA: Diagnosis not present

## 2023-08-12 DIAGNOSIS — J4489 Other specified chronic obstructive pulmonary disease: Secondary | ICD-10-CM | POA: Diagnosis not present

## 2023-08-12 DIAGNOSIS — G20A1 Parkinson's disease without dyskinesia, without mention of fluctuations: Secondary | ICD-10-CM | POA: Diagnosis not present

## 2023-08-12 DIAGNOSIS — Z741 Need for assistance with personal care: Secondary | ICD-10-CM | POA: Diagnosis not present

## 2023-08-12 DIAGNOSIS — M6281 Muscle weakness (generalized): Secondary | ICD-10-CM | POA: Diagnosis not present

## 2023-08-12 DIAGNOSIS — I340 Nonrheumatic mitral (valve) insufficiency: Secondary | ICD-10-CM | POA: Diagnosis not present

## 2023-08-12 DIAGNOSIS — R2681 Unsteadiness on feet: Secondary | ICD-10-CM | POA: Diagnosis not present

## 2023-08-12 DIAGNOSIS — R278 Other lack of coordination: Secondary | ICD-10-CM | POA: Diagnosis not present

## 2023-08-12 NOTE — Transitions of Care (Post Inpatient/ED Visit) (Signed)
08/12/2023  Name: Crystal Bass MRN: [id number] DOB: [birth year]  Today's TOC FU Call Status: Today's TOC FU Call Status:: Successful TOC FU Call Completed TOC FU Call Complete Date: 08/12/23 Patient's Name and Date of Birth confirmed.  Transition Care Management Follow-up Telephone Call Date of Discharge: 08/10/23 Discharge Facility: [facility]) Type of Discharge: Inpatient Admission Primary Inpatient Discharge Diagnosis:: community acquired pneumonia How have you been since you were released from the hospital?: Better Any questions or concerns?: No  Items Reviewed: Did you receive and understand the discharge instructions provided?: Yes Medications obtained,verified, and reconciled?: Yes (Medications Reviewed) Any new allergies since your discharge?: No Dietary orders reviewed?: No Do you have support at home?: Yes People in Home: alone Name of Support/Comfort Primary Source: niece angela, brother Loraine Leriche  Medications Reviewed Today: Medications Reviewed Today     Reviewed by Luella Cook, RN (Case Manager) on 08/12/23 at 1430  Med List Status: <None>   Medication Order Taking? Sig Documenting Provider Last Dose Status Informant  acetaminophen (TYLENOL) 500 MG tablet [id number] Yes Take 1,000 mg by mouth 3 (three) times daily. [provider] Taking Active Self  albuterol (VENTOLIN HFA) 108 (90 Base) MCG/ACT inhaler [id number] Yes Inhale 2 puffs into the lungs every 6 (six) hours as needed for wheezing or shortness of breath. Eustaquio Boyden, MD Taking Active Self  aspirin 81 MG tablet [id number] Yes Take 1 tablet (81 mg total) by mouth daily. Eustaquio Boyden, MD Taking Active Self  azithromycin (ZITHROMAX Z-PAK) 250 MG tablet [id number] Yes Take 2 tablets (500 mg) on  Day 1,  followed by 1 tablet (250 mg) once daily on Days 2 through 5. Delton Prairie, MD Taking Active   calcium carbonate (TUMS - DOSED IN MG ELEMENTAL CALCIUM) 500 MG  chewable tablet [id number] Yes Chew 2 tablets by mouth daily as needed for indigestion or heartburn. [provider] Taking Active Self  Carbidopa-Levodopa ER (SINEMET CR) 25-100 MG tablet controlled release [id number] Yes TAKE TWO TABLET BY MOUTH AT NINE IN THE MORNING, ONE TAB AT ONE IN THE EVENING AND ONE TAB AT FIVE IN THE EVENING Tat, Octaviano Batty, DO Taking Active Self  cefdinir (OMNICEF) 300 MG capsule [id number] Yes Take 1 capsule (300 mg total) by mouth 2 (two) times daily for 7 days. Delton Prairie, MD Taking Active   Cholecalciferol (VITAMIN D3) 25 MCG (1000 UT) CAPS [id number] Yes Take 1 capsule (1,000 Units total) by mouth daily. Eustaquio Boyden, MD Taking Active Self           Med Note Robyne Askew Dec 20, 2022 10:53 AM)    clonazePAM (KLONOPIN) 0.5 MG tablet [id number] Yes TAKE ONE HALF (1/2) A TABLET BY MOUTH AT BEDTIME Antony Madura, MD Taking Active   diphenhydrAMINE (BENADRYL) 25 MG tablet [id number] Yes Take 50 mg by mouth in the morning. [provider] Taking Active Self  EPINEPHrine 0.3 mg/0.3 mL IJ SOAJ injection [id number] Yes Inject 0.3 mg into the muscle as needed for anaphylaxis. [provider] Taking Active Self           Med Note [facility] Tressia Danas Mar 19, 2022 12:36 PM)    FEROSUL 325 (65 Fe) MG tablet [id number] Yes TAKE ONE TABLET BY MOUTH DAILY WITH Antony Madura, MD Taking Active Self  Fluticasone-Umeclidin-Vilant [facility] ELLIPTA) 100-62.5-25 MCG/ACT AEPB [id number] Yes Inhale 1 puff into the lungs daily. Noemi Chapel, NP Taking Active Self  furosemide (LASIX) 40 MG tablet [id number] Yes TAKE ONE TABLET BY MOUTH ONCE DAILY *NEW DOSEIran Ouch, MD Taking Active Self  gabapentin ([name]) 300 MG capsule [id number] Yes TAKE TWO CAPSULES BY MOUTH THREE TIMES DAILY Eustaquio Boyden, MD Taking Active Self  pantoprazole (PROTONIX) 20 MG tablet [id number] Yes Take 1 tablet (20 mg total) by mouth daily.  Eustaquio Boyden, MD Taking Active   potassium chloride SA (KLOR-CON M) 20 MEQ tablet [id number] Yes TAKE ONE TABLET BY MOUTH ONCE A DAY Iran Ouch, MD Taking Active Self  Prednisolon-Moxiflox-Bromfenac 1-0.5-0.075 % SOLN [id number] Yes Place 1 drop into the right eye 4 (four) times daily. [provider] Taking Active Self  Prednisolon-Moxiflox-Bromfenac 1-0.5-0.075 % SOLN [id number] Yes Place 1 drop into the left eye 2 (two) times daily. [provider] Taking Active Self  predniSONE (DELTASONE) 50 MG tablet [id number] Yes Take 1 tablet (50 mg total) by mouth daily for 4 days. Delton Prairie, MD Taking Active             Home Care and Equipment/Supplies: Were Home Health Services Ordered?: Yes [facility] of [facility] Agency[facility] Has Agency set up a time to come to your home?: No EMR reviewed for Home Health Orders: Orders present/patient has not received call (refer to CM for follow-up) Any new equipment or medical supplies ordered?: No  Functional Questionnaire: Do you need assistance with bathing/showering or dressing?: No Do you need assistance with meal preparation?: No Do you need assistance with eating?: No Do you have difficulty maintaining continence: No Do you need assistance with getting out of bed/getting out of a chair/moving?: No Do you have difficulty managing or taking your medications?: No  Follow up appointments reviewed: PCP Follow-up appointment confirmed?: Yes Date of PCP follow-up appointment?: 08/19/23 Follow-up Provider: Dr Sharen Hones [facility] Follow-up appointment confirmed?: No Reason Specialist Follow-Up Not Confirmed: Patient has Specialist Provider Number and will Call for Appointment Do you need transportation to your follow-up appointment?: No Do you understand care options if your condition(s) worsen?: Yes-patient verbalized understanding  SDOH Interventions Today    Flowsheet Row Most Recent Value  SDOH  Interventions   Food Insecurity Interventions Intervention Not Indicated  Housing Interventions Intervention Not Indicated  Transportation Interventions Intervention Not Indicated, Patient Resources (Friends/Family)      Interventions Today    Flowsheet Row Most Recent Value  General Interventions   General Interventions Discussed/Reviewed General Interventions Reviewed, General Interventions Discussed, Doctor Visits  Doctor Visits Discussed/Reviewed Doctor Visits Discussed, Doctor Visits Reviewed, Specialist  PCP/Specialist Visits Compliance with follow-up visit  Pharmacy Interventions   Pharmacy Dicussed/Reviewed Pharmacy Topics Discussed      TOC Interventions Today    Flowsheet Row Most Recent Value  TOC Interventions   TOC Interventions Discussed/Reviewed TOC Interventions Discussed, TOC Interventions Reviewed, Arranged PCP follow up within 7 days/Care Guide scheduled      Contacted annual wellness scheduler to reschedule visit  Gean Maidens BSN RN [facility] [phone]

## 2023-08-14 LAB — CULTURE, BLOOD (ROUTINE X 2)
Culture: NO GROWTH
Culture: NO GROWTH
Special Requests: ADEQUATE
Special Requests: ADEQUATE

## 2023-08-15 DIAGNOSIS — R2681 Unsteadiness on feet: Secondary | ICD-10-CM | POA: Diagnosis not present

## 2023-08-15 DIAGNOSIS — I340 Nonrheumatic mitral (valve) insufficiency: Secondary | ICD-10-CM | POA: Diagnosis not present

## 2023-08-15 DIAGNOSIS — G20A1 Parkinson's disease without dyskinesia, without mention of fluctuations: Secondary | ICD-10-CM | POA: Diagnosis not present

## 2023-08-15 DIAGNOSIS — M6281 Muscle weakness (generalized): Secondary | ICD-10-CM | POA: Diagnosis not present

## 2023-08-15 DIAGNOSIS — J4489 Other specified chronic obstructive pulmonary disease: Secondary | ICD-10-CM | POA: Diagnosis not present

## 2023-08-15 DIAGNOSIS — R278 Other lack of coordination: Secondary | ICD-10-CM | POA: Diagnosis not present

## 2023-08-15 DIAGNOSIS — I5033 Acute on chronic diastolic (congestive) heart failure: Secondary | ICD-10-CM | POA: Diagnosis not present

## 2023-08-15 DIAGNOSIS — R4189 Other symptoms and signs involving cognitive functions and awareness: Secondary | ICD-10-CM | POA: Diagnosis not present

## 2023-08-15 DIAGNOSIS — Z741 Need for assistance with personal care: Secondary | ICD-10-CM | POA: Diagnosis not present

## 2023-08-16 DIAGNOSIS — M6281 Muscle weakness (generalized): Secondary | ICD-10-CM | POA: Diagnosis not present

## 2023-08-16 DIAGNOSIS — J4489 Other specified chronic obstructive pulmonary disease: Secondary | ICD-10-CM | POA: Diagnosis not present

## 2023-08-16 DIAGNOSIS — R278 Other lack of coordination: Secondary | ICD-10-CM | POA: Diagnosis not present

## 2023-08-16 DIAGNOSIS — Z741 Need for assistance with personal care: Secondary | ICD-10-CM | POA: Diagnosis not present

## 2023-08-16 DIAGNOSIS — G20A1 Parkinson's disease without dyskinesia, without mention of fluctuations: Secondary | ICD-10-CM | POA: Diagnosis not present

## 2023-08-16 DIAGNOSIS — I5033 Acute on chronic diastolic (congestive) heart failure: Secondary | ICD-10-CM | POA: Diagnosis not present

## 2023-08-16 DIAGNOSIS — R2681 Unsteadiness on feet: Secondary | ICD-10-CM | POA: Diagnosis not present

## 2023-08-16 DIAGNOSIS — I340 Nonrheumatic mitral (valve) insufficiency: Secondary | ICD-10-CM | POA: Diagnosis not present

## 2023-08-16 DIAGNOSIS — R4189 Other symptoms and signs involving cognitive functions and awareness: Secondary | ICD-10-CM | POA: Diagnosis not present

## 2023-08-19 ENCOUNTER — Encounter: Payer: Self-pay | Admitting: Family Medicine

## 2023-08-19 ENCOUNTER — Ambulatory Visit: Payer: Medicare PPO | Admitting: Family Medicine

## 2023-08-19 VITALS — BP 138/82 | HR 67 | Temp 98.0°F | Ht 62.0 in | Wt 113.2 lb

## 2023-08-19 DIAGNOSIS — I10 Essential (primary) hypertension: Secondary | ICD-10-CM

## 2023-08-19 DIAGNOSIS — R0609 Other forms of dyspnea: Secondary | ICD-10-CM

## 2023-08-19 DIAGNOSIS — J449 Chronic obstructive pulmonary disease, unspecified: Secondary | ICD-10-CM | POA: Diagnosis not present

## 2023-08-19 DIAGNOSIS — I340 Nonrheumatic mitral (valve) insufficiency: Secondary | ICD-10-CM

## 2023-08-19 DIAGNOSIS — Z95818 Presence of other cardiac implants and grafts: Secondary | ICD-10-CM

## 2023-08-19 DIAGNOSIS — I5032 Chronic diastolic (congestive) heart failure: Secondary | ICD-10-CM

## 2023-08-19 DIAGNOSIS — J189 Pneumonia, unspecified organism: Secondary | ICD-10-CM | POA: Diagnosis not present

## 2023-08-19 DIAGNOSIS — R4189 Other symptoms and signs involving cognitive functions and awareness: Secondary | ICD-10-CM | POA: Diagnosis not present

## 2023-08-19 DIAGNOSIS — M6281 Muscle weakness (generalized): Secondary | ICD-10-CM | POA: Diagnosis not present

## 2023-08-19 DIAGNOSIS — R278 Other lack of coordination: Secondary | ICD-10-CM | POA: Diagnosis not present

## 2023-08-19 DIAGNOSIS — I5033 Acute on chronic diastolic (congestive) heart failure: Secondary | ICD-10-CM | POA: Diagnosis not present

## 2023-08-19 DIAGNOSIS — A310 Pulmonary mycobacterial infection: Secondary | ICD-10-CM

## 2023-08-19 DIAGNOSIS — R400 Somnolence: Secondary | ICD-10-CM

## 2023-08-19 DIAGNOSIS — Z9889 Other specified postprocedural states: Secondary | ICD-10-CM | POA: Diagnosis not present

## 2023-08-19 DIAGNOSIS — R918 Other nonspecific abnormal finding of lung field: Secondary | ICD-10-CM

## 2023-08-19 DIAGNOSIS — R2681 Unsteadiness on feet: Secondary | ICD-10-CM | POA: Diagnosis not present

## 2023-08-19 DIAGNOSIS — G20A1 Parkinson's disease without dyskinesia, without mention of fluctuations: Secondary | ICD-10-CM | POA: Diagnosis not present

## 2023-08-19 DIAGNOSIS — Z741 Need for assistance with personal care: Secondary | ICD-10-CM | POA: Diagnosis not present

## 2023-08-19 DIAGNOSIS — J4489 Other specified chronic obstructive pulmonary disease: Secondary | ICD-10-CM | POA: Diagnosis not present

## 2023-08-19 NOTE — Assessment & Plan Note (Signed)
Completed treatment with azithromycin/cefdinir with improvement in dyspnea, however since home notes this is slowly recurrent. She has upcoming f/u non-contrasted lung CT scheduled for tomorrow.

## 2023-08-19 NOTE — Progress Notes (Addendum)
Ph: [phone] Fax: [phone]   Patient ID: Crystal Bass, female    DOB: 04/13/1941, 82 y.o.   MRN: [id number]  This visit was conducted in person.  BP 138/82   Pulse 67   Temp 98 F (36.7 C) (Temporal)   Ht 5\' 2"  (1.575 m)   Wt 113 lb 4 oz (51.4 kg)   SpO2 95%   BMI 20.71 kg/m    CC: hosp f/u visit  Subjective:   HPI: Crystal Bass is a 82 y.o. female presenting on 08/19/2023 for Hospitalization Follow-up (Admitted on 08/08/23 at [facility], dx community acquired PNA of L lower lobe of lung. )   Recent hospitalization for LLL CAP. Presented with elevated D dimer in setting of worsened dyspnea since COVID infection 06/2023. CTA chest negative for PE but did show new nodular densities and pneumonia as per below: IMPRESSION: 1. No evidence for pulmonary embolism. 2. New patchy airspace disease throughout the left lower lobe and minimally in the left upper lobe worrisome for pneumonia. 3. New nodular densities in the left upper lobe and left lower lobe measuring up to 12 mm. Findings may be infectious/inflammatory, but metastatic disease is not excluded. 4. New nodular densities in the right lower lobe measuring up to 10 mm. Findings may be infectious/inflammatory or neoplastic. 5. Stable right lower lobe nodule measuring 12 mm. 6. Stable ground-glass opacities throughout both lungs. 7. Stable moderate cardiomegaly. 8. Findings compatible with pulmonary artery hypertension. Hospital records reviewed. Med rec performed.  Blcx no growth x2 - final  Treated in hospital with rocephin and azithromycin, transitioned to oral azithromycin and cefdinir. No need for supplemental oxygen during hospitalization.   Since home, dyspnea was improving but now seems to be recurring. No fevers/chills, cough, chest pain.   S/p MitraClip x3 for mitral valve prolapse, latest 03/2023.  She lives at [facility]. She has not yet received flu shot. She will wait 90d from COVID illness  07/08/2023 to get COVID vaccine.   Just completed bilateral cataract surgery ([place]).   Home health not set up.  Other follow up appointments scheduled: tomorrow with Dr Marchelle Gearing ______________________________________________________________________ Hospital admission: 08/08/2023 Hospital discharge: 08/10/2023 TCM f/u phone call:  performed on 08/12/2023  Recommendations at discharge:     F/u PCP in 1-2 weeks F/u with Dr Marchelle Gearing Pulmonary in 1 week     Discharge Diagnoses: Principal Problem:   CAP (community acquired pneumonia) Active Problems:   Dyspnea on exertion, acute on chronic   COPD (chronic obstructive pulmonary disease) (HCC)   [name] (mycobacterium avium-intracellulare) infection (HCC)   Pulmonary nodules   History of pulmonary embolism   Severe mitral regurgitation   S/P mitral valve clip implantation   Chronic heart failure with preserved ejection fraction (HFpEF) (HCC)   Parkinson disease   Frailty     Relevant past medical, surgical, family and social history reviewed and updated as indicated. Interim medical history since our last visit reviewed. Allergies and medications reviewed and updated. Outpatient Medications Prior to Visit  Medication Sig Dispense Refill   acetaminophen (TYLENOL) 500 MG tablet Take 1,000 mg by mouth 3 (three) times daily.     albuterol (VENTOLIN HFA) 108 (90 Base) MCG/ACT inhaler Inhale 2 puffs into the lungs every 6 (six) hours as needed for wheezing or shortness of breath. 18 g 1   aspirin 81 MG tablet Take 1 tablet (81 mg total) by mouth daily. 30 tablet    calcium carbonate (TUMS - DOSED IN MG ELEMENTAL  CALCIUM) 500 MG chewable tablet Chew 2 tablets by mouth daily as needed for indigestion or heartburn.     Carbidopa-Levodopa ER (SINEMET CR) 25-100 MG tablet controlled release TAKE TWO TABLET BY MOUTH AT NINE IN THE MORNING, ONE TAB AT ONE IN THE EVENING AND ONE TAB AT FIVE IN THE EVENING 360 tablet 1   Cholecalciferol (VITAMIN  D3) 25 MCG (1000 UT) CAPS Take 1 capsule (1,000 Units total) by mouth daily. 30 capsule    clonazePAM (KLONOPIN) 0.5 MG tablet TAKE ONE HALF (1/2) A TABLET BY MOUTH AT BEDTIME 45 tablet 0   diphenhydrAMINE (BENADRYL) 25 MG tablet Take 50 mg by mouth in the morning.     EPINEPHrine 0.3 mg/0.3 mL IJ SOAJ injection Inject 0.3 mg into the muscle as needed for anaphylaxis.     Fluticasone-Umeclidin-Vilant (TRELEGY ELLIPTA) 100-62.5-25 MCG/ACT AEPB Inhale 1 puff into the lungs daily. 1 each 6   furosemide (LASIX) 40 MG tablet TAKE ONE TABLET BY MOUTH ONCE DAILY *NEW DOSE* 90 tablet 1   gabapentin ([name]) 300 MG capsule TAKE TWO CAPSULES BY MOUTH THREE TIMES DAILY 540 capsule 1   pantoprazole (PROTONIX) 20 MG tablet Take 1 tablet (20 mg total) by mouth daily.     potassium chloride SA (KLOR-CON M) 20 MEQ tablet TAKE ONE TABLET BY MOUTH ONCE A DAY 60 tablet 1   Prednisolon-Moxiflox-Bromfenac 1-0.5-0.075 % SOLN Place 1 drop into the right eye 4 (four) times daily.     Prednisolon-Moxiflox-Bromfenac 1-0.5-0.075 % SOLN Place 1 drop into the left eye 2 (two) times daily.     FEROSUL 325 (65 Fe) MG tablet TAKE ONE TABLET BY MOUTH DAILY WITH BREAKFAST 90 tablet 1   No facility-administered medications prior to visit.     Per HPI unless specifically indicated in ROS section below Review of Systems  Objective:  BP 138/82   Pulse 67   Temp 98 F (36.7 C) (Temporal)   Ht 5\' 2"  (1.575 m)   Wt 113 lb 4 oz (51.4 kg)   SpO2 95%   BMI 20.71 kg/m   Wt Readings from Last 3 Encounters:  08/19/23 113 lb 4 oz (51.4 kg)  08/09/23 117 lb 14.4 oz (53.5 kg)  08/07/23 117 lb 14.4 oz (53.5 kg)      Physical Exam Vitals and nursing note reviewed.  Constitutional:      Appearance: She is underweight.  HENT:     Mouth/Throat:     Mouth: Mucous membranes are moist.     Pharynx: Oropharynx is clear. No oropharyngeal exudate or posterior oropharyngeal erythema.  Cardiovascular:     Rate and Rhythm: Normal  rate.     Pulses: Normal pulses.     Heart sounds: Murmur heard.  Pulmonary:     Effort: No respiratory distress.     Breath sounds: Decreased air movement present. Decreased breath sounds present. No wheezing.     Comments: Diminished breath sounds L>R predominantly LLL with few rales  Musculoskeletal:     Right lower leg: No edema.     Left lower leg: No edema.     Comments: Wearing TED stockings bilaterally  Skin:    General: Skin is warm and dry.     Findings: No rash.  Neurological:     Mental Status: She is alert.  Psychiatric:        Mood and Affect: Mood normal.        Behavior: Behavior normal.        Assessment &  Plan:   Problem List Items Addressed This Visit     Essential hypertension    BP stable off antihypertensive medication.       CAP (community acquired pneumonia) - Primary    Completed treatment with azithromycin/cefdinir with improvement in dyspnea, however since home notes this is slowly recurrent. She has upcoming f/u non-contrasted lung CT scheduled for tomorrow.       COPD (chronic obstructive pulmonary disease) (HCC)    She continues controller inhaler Trelegy Ellipta 1 puff daily.      Dyspnea on exertion, acute on chronic    Improved while on antibiotics in hospital, now may be recurring.  Was in process of being worked up for post-COVID dyspnea, tested negative for PE, incidentally multiple lung nodules were discovered. Has f/u lung CT scheduled for tomorrow through pulm.       Parkinson disease   [name] (mycobacterium avium-intracellulare) infection (HCC)   Severe mitral regurgitation   S/P mitral valve clip implantation   Chronic heart failure with preserved ejection fraction (HFpEF) (HCC)   Pulmonary nodules    Stable old nodule to RLL with new nodules bilaterally - pending rpt noncontrasted lung CT for tomorrow. Pulm on board.       Daytime somnolence    Notes significant daytime somnolence - reviewed possible medications that  contribute (klonopin, gabapentin, benadryl). She declines changing dose of gabapentin (for chronic nerve pain) or klonopin used for sleep. Will stop benadryl, in its place use allegra for chronic allergies. Previously on allergy shots until this past year.         No orders of the defined types were placed in this encounter.   No orders of the defined types were placed in this encounter.   Patient Instructions  You have CT scheduled tomorrow 1:20pm at [facility] - [address]. Their # is [phone]  Stop benadryl. In its place take allegra 180mg  daily for allergies.  Stop oral iron due to GI upset  Consider lower gabapentin dose.  Return to see me in 3 months   Follow up plan: Return in about 3 months (around 11/18/2023) for annual exam, prior fasting for blood work, medicare wellness visit.  Eustaquio Boyden, MD

## 2023-08-19 NOTE — Assessment & Plan Note (Signed)
Notes significant daytime somnolence - reviewed possible medications that contribute (klonopin, gabapentin, benadryl). She declines changing dose of gabapentin (for chronic nerve pain) or klonopin used for sleep. Will stop benadryl, in its place use allegra for chronic allergies. Previously on allergy shots until this past year.

## 2023-08-19 NOTE — Assessment & Plan Note (Signed)
BP stable off antihypertensive medication.

## 2023-08-19 NOTE — Patient Instructions (Addendum)
You have CT scheduled tomorrow 1:20pm at [facility] - [address]. Their # is [phone]  Stop benadryl. In its place take allegra 180mg  daily for allergies.  Stop oral iron due to GI upset  Consider lower gabapentin dose.  Return to see me in 3 months

## 2023-08-19 NOTE — Assessment & Plan Note (Signed)
Improved while on antibiotics in hospital, now may be recurring.  Was in process of being worked up for post-COVID dyspnea, tested negative for PE, incidentally multiple lung nodules were discovered. Has f/u lung CT scheduled for tomorrow through pulm.

## 2023-08-19 NOTE — Assessment & Plan Note (Signed)
She continues controller inhaler Trelegy Ellipta 1 puff daily.

## 2023-08-19 NOTE — Assessment & Plan Note (Signed)
Stable old nodule to RLL with new nodules bilaterally - pending rpt noncontrasted lung CT for tomorrow. Pulm on board.

## 2023-08-20 ENCOUNTER — Other Ambulatory Visit: Payer: Medicare PPO

## 2023-08-20 ENCOUNTER — Ambulatory Visit
Admission: RE | Admit: 2023-08-20 | Discharge: 2023-08-20 | Disposition: A | Discharge status: Home / Self Care | Payer: Medicare PPO | Source: Ambulatory Visit | Attending: Internal Medicine | Admitting: Internal Medicine

## 2023-08-20 DIAGNOSIS — R918 Other nonspecific abnormal finding of lung field: Secondary | ICD-10-CM | POA: Diagnosis not present

## 2023-08-20 DIAGNOSIS — R0609 Other forms of dyspnea: Secondary | ICD-10-CM | POA: Diagnosis not present

## 2023-08-20 DIAGNOSIS — I700 Atherosclerosis of aorta: Secondary | ICD-10-CM | POA: Diagnosis not present

## 2023-08-20 DIAGNOSIS — Z8619 Personal history of other infectious and parasitic diseases: Secondary | ICD-10-CM

## 2023-08-21 DIAGNOSIS — Z96642 Presence of left artificial hip joint: Secondary | ICD-10-CM | POA: Diagnosis not present

## 2023-08-27 ENCOUNTER — Encounter: Payer: Self-pay | Admitting: Cardiovascular Disease

## 2023-08-27 ENCOUNTER — Ambulatory Visit: Payer: Medicare PPO | Attending: Cardiovascular Disease | Admitting: Cardiovascular Disease

## 2023-08-27 VITALS — BP 120/74 | HR 141 | Ht 62.0 in | Wt 120.1 lb

## 2023-08-27 DIAGNOSIS — I340 Nonrheumatic mitral (valve) insufficiency: Secondary | ICD-10-CM

## 2023-08-27 DIAGNOSIS — I5033 Acute on chronic diastolic (congestive) heart failure: Secondary | ICD-10-CM

## 2023-08-27 DIAGNOSIS — I4891 Unspecified atrial fibrillation: Secondary | ICD-10-CM

## 2023-08-27 DIAGNOSIS — Z961 Presence of intraocular lens: Secondary | ICD-10-CM | POA: Diagnosis not present

## 2023-08-27 MED ORDER — METOPROLOL TARTRATE 25 MG PO TABS: 25.0000 mg | ORAL_TABLET | Freq: Two times a day (BID) | ORAL | 3 refills | Status: DC

## 2023-08-27 MED ORDER — APIXABAN 2.5 MG PO TABS: 2.5000 mg | ORAL_TABLET | Freq: Two times a day (BID) | ORAL | 3 refills | Status: DC

## 2023-08-27 NOTE — Patient Instructions (Addendum)
Medication Instructions:  Your physician recommends the following medication changes.  STOP TAKING: Aspirin  START TAKING: Metoprolol Tartrate 25 mg twice daily Eliquis 2.5 mg twice daily   *If you need a refill on your cardiac medications before your next appointment, please call your pharmacy*   Lab Work: None ordered If you have labs (blood work) drawn today and your tests are completely normal, you will receive your results only by: MyChart Message (if you have MyChart) OR A paper copy in the mail If you have any lab test that is abnormal or we need to change your treatment, we will call you to review the results.   Testing/Procedures: None ordered   Follow-Up: At [facility], you and your health needs are our priority.  As part of our continuing mission to provide you with exceptional heart care, we have created designated Provider Care Teams.  These Care Teams include your primary Cardiologist (physician) and Advanced Practice Providers (APPs -  Physician Assistants and Nurse Practitioners) who all work together to provide you with the care you need, when you need it.  We recommend signing up for the patient portal called "MyChart".  Sign up information is provided on this After Visit Summary.  MyChart is used to connect with patients for Virtual Visits (Telemedicine).  Patients are able to view lab/test results, encounter notes, upcoming appointments, etc.  Non-urgent messages can be sent to your provider as well.   To learn more about what you can do with MyChart, go to [email or web address].    Your next appointment:   1 week(s)  Provider:   You will see one of the following Advanced Practice Providers on your designated Care Team:   Nicolasa Ducking, NP Eula Listen, PA-C Cadence Fransico Michael, PA-C Charlsie Quest, NP

## 2023-08-27 NOTE — Progress Notes (Signed)
Cardiology Office Note   Date:  08/27/2023   ID:  Crystal Bass, DOB 1941/03/08, MRN [id number]  PCP:  Eustaquio Boyden, MD  Cardiologist:   Lorine Bears, MD   Chief Complaint  Patient presents with   Follow-up    4 month f/u c/o chest pain and sob 2-3 WKS. Meds reviewed verbally with pt.      History of Present Illness: Crystal Bass is a 82 y.o. female who is here today for follow-up visit regarding mitral regurgitation status post mitral valve clip.   She has chronic medical conditions that include mitral valve prolapse with regurgitation, exertional dyspnea and orthostatic hypotension in the setting of Parkinson's disease. She was diagnosed with Parkinson's in 2015 . She used to be on multiple blood pressure medications which were discontinued due to orthostatic hypotension.     She underwent mitral valve clip procedure in March of 2023.  However, she had minimal improvement in symptoms since then and repeat echocardiogram showed evidence of severe mitral regurgitation confirmed by TEE.   She underwent a third mitral valve clip placement in May of this year.    Postprocedure echo showed mild to moderate MR.   She was hospitalized last month with pneumonia.  She had COVID in August.  She has been having increased shortness of breath and noticed palpitations over the last week.  She is noted to be in atrial fibrillation with RVR today.  This is a new diagnosis.   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 10/2021 Cath: Nl cors.   Arthritis    Asthma    Breast cancer (HCC) 2002   infiltrative ductal carcinoma    Cataract 2019   Chronic heart failure with preserved ejection fraction (HFpEF) (HCC)    COPD (chronic obstructive pulmonary disease) (HCC) 07/2007   Coronary artery disease    Ductal carcinoma of breast, estrogen receptor positive, stage 1 (HCC) 09/16/2012    Endometriosis    GERD (gastroesophageal reflux disease)    Hx of mitral valve repair    Hypercalcemia 09/14/2013   Hypertension    Lesion of breast 1992   right, benign   Lung disease    secondary to [name] infection   Mastalgia 05/1994   Mitral regurgitation    a. 10/2021 RHC: PCWP V wave of 34; b. 01/2022 TEE: Severe flail/prolapse of large area of A2 scallop with severe posteriorly directed MR; c. 01/2022 TEER w /Mitraclip XTW x 2; d. 07/2022 Echo: EF 60-65%. No rwma, mild LVH, GrIII DD. Sev dil LA. Mod elev PASP. Mod-Sev MR. Mod MS.   Moderate mitral regurgitation by prior echocardiogram    Osteoporosis, post-menopausal 03/14/2012   Ovarian tumor of borderline malignancy, right 2004   Palpitations    a. 02/2022 Zio: Predominantly sinus rhythm at 67 (52-171).  2 runs of NSVT -fastest 9 beats at 167.  35 SVT runs-longest 19 beats, fastest 171x6 beats.   Pancreatitis    secondary to Cholelithiasis   Parkinson disease (HCC) 02/10/2015   Post-thoracotomy pain syndrome    Pseudomonas pneumonia (HCC) 03/2008   Status post implantation of mitral valve leaflet clip 02/15/2022   s/p XTW MitraClip x2 by Dr. Excell Seltzer in the A2/P2: b. 04/03/23 s/p third mitraclip implantation.   Traumatic closed fracture of distal clavicle with minimal displacement, left, initial encounter 06/2021   [facility]  Uterine fibroid    Vitamin D deficiency     Past Surgical History:  Procedure Laterality Date   ABDOMINAL HYSTERECTOMY     & BSO for Mucinous borderline tumor of R ovary 2004   APPENDECTOMY  2004   BREAST BIOPSY  08/2004   right breast-benign   BREAST LUMPECTOMY  1992   benign   BUBBLE STUDY  12/27/2021   Procedure: BUBBLE STUDY;  Surgeon: Sande Rives, MD;  Location: [facility] ENDOSCOPY;  Service: Cardiovascular;;   CARDIAC CATHETERIZATION  2007   essentially negation for significant CAD   CATARACT EXTRACTION W/PHACO Left 07/24/2023   Procedure: CATARACT EXTRACTION PHACO AND INTRAOCULAR LENS  PLACEMENT (IOC) LEFT 5.34 0037.3;  Surgeon: Lockie Mola, MD;  Location: [facility];  Service: Ophthalmology;  Laterality: Left;   CATARACT EXTRACTION W/PHACO Right 08/07/2023   Procedure: CATARACT EXTRACTION PHACO AND INTRAOCULAR LENS PLACEMENT (IOC) RIGHT 7.83 00:47.9;  Surgeon: Lockie Mola, MD;  Location: [facility];  Service: Ophthalmology;  Laterality: Right;   CHOLECYSTECTOMY  2001   COLONOSCOPY  2014   Dr Marina Goodell , due 2019   COLONOSCOPY  09/2018   2 TA removed, int hem, no f/u needed (Dr Marina Goodell)    ESOPHAGOGASTRODUODENOSCOPY  09/2018   GERD with esophagitis and stricture dilated, antral gastritis negative for H pylori Marina Goodell)   HIP ARTHROPLASTY Left 08/21/2022   Procedure: ARTHROPLASTY BIPOLAR HIP (HEMIARTHROPLASTY);  Surgeon: Juanell Fairly, MD;  Location: [facility] ORS;  Service: Orthopedics;  Laterality: Left;   LUNG BIOPSY  2002   [name], Dr Edwyna Shell   MASTECTOMY MODIFIED RADICAL Left 2002   oral chemotheraphy (tamoxifen then Armidex) no radiation, Dr.[name]   MITRAL VALVE REPAIR N/A 02/15/2022   Procedure: MITRAL VALVE REPAIR;  Surgeon: Tonny Bollman, MD;  Location: [facility] INVASIVE CV LAB;  Service: Cardiovascular;  Laterality: N/A;   MITRAL VALVE REPAIR N/A 04/03/2023   Procedure: MITRAL VALVE REPAIR;  Surgeon: Tonny Bollman, MD;  Location: [facility] INVASIVE CV LAB;  Service: Cardiovascular;  Laterality: N/A;   RIGHT/LEFT HEART CATH AND CORONARY ANGIOGRAPHY Bilateral 11/14/2021   Procedure: RIGHT/LEFT HEART CATH AND CORONARY ANGIOGRAPHY;  Surgeon: Iran Ouch, MD;  Location: [facility] INVASIVE CV LAB;  Service: Cardiovascular;  Laterality: Bilateral;   TEE WITHOUT CARDIOVERSION N/A 12/29/2018   Procedure: TRANSESOPHAGEAL ECHOCARDIOGRAM (TEE);  Surgeon: Iran Ouch, MD;  Location: [facility] ORS;  Service: Cardiovascular;  Laterality: N/A;   TEE WITHOUT CARDIOVERSION N/A 12/27/2021   Procedure: TRANSESOPHAGEAL ECHOCARDIOGRAM (TEE);  Surgeon: Sande Rives, MD;  Location: [facility] ENDOSCOPY;  Service: Cardiovascular;  Laterality: N/A;   TEE WITHOUT CARDIOVERSION N/A 02/15/2022   Procedure: TRANSESOPHAGEAL ECHOCARDIOGRAM (TEE);  Surgeon: Tonny Bollman, MD;  Location: [facility] INVASIVE CV LAB;  Service: Cardiovascular;  Laterality: N/A;   TEE WITHOUT CARDIOVERSION N/A 05/14/2022   Procedure: TRANSESOPHAGEAL ECHOCARDIOGRAM (TEE);  Surgeon: Sande Rives, MD;  Location: [facility] ENDOSCOPY;  Service: Cardiovascular;  Laterality: N/A;   TEE WITHOUT CARDIOVERSION N/A 04/03/2023   Procedure: TRANSESOPHAGEAL ECHOCARDIOGRAM;  Surgeon: Tonny Bollman, MD;  Location: [facility] INVASIVE CV LAB;  Service: Cardiovascular;  Laterality: N/A;   TONSILLECTOMY  1946     Current Outpatient Medications  Medication Sig Dispense Refill   acetaminophen (TYLENOL) 500 MG tablet Take 1,000 mg by mouth 3 (three) times daily.     albuterol (VENTOLIN HFA) 108 (90 Base) MCG/ACT inhaler Inhale 2 puffs into the lungs every 6 (six) hours as needed for wheezing or shortness of breath. 18 g 1   aspirin 81 MG tablet  Take 1 tablet (81 mg total) by mouth daily. 30 tablet    calcium carbonate (TUMS - DOSED IN MG ELEMENTAL CALCIUM) 500 MG chewable tablet Chew 2 tablets by mouth daily as needed for indigestion or heartburn.     Carbidopa-Levodopa ER (SINEMET CR) 25-100 MG tablet controlled release TAKE TWO TABLET BY MOUTH AT NINE IN THE MORNING, ONE TAB AT ONE IN THE EVENING AND ONE TAB AT FIVE IN THE EVENING 360 tablet 1   Cholecalciferol (VITAMIN D3) 25 MCG (1000 UT) CAPS Take 1 capsule (1,000 Units total) by mouth daily. 30 capsule    clonazePAM (KLONOPIN) 0.5 MG tablet TAKE ONE HALF (1/2) A TABLET BY MOUTH AT BEDTIME 45 tablet 0   EPINEPHrine 0.3 mg/0.3 mL IJ SOAJ injection Inject 0.3 mg into the muscle as needed for anaphylaxis.     Fexofenadine HCl (ALLEGRA PO) Take by mouth daily at 8 pm.     Fluticasone-Umeclidin-Vilant (TRELEGY ELLIPTA) 100-62.5-25 MCG/ACT AEPB Inhale 1 puff into the lungs  daily. 1 each 6   furosemide (LASIX) 40 MG tablet TAKE ONE TABLET BY MOUTH ONCE DAILY *NEW DOSE* 90 tablet 1   gabapentin ([name]) 300 MG capsule TAKE TWO CAPSULES BY MOUTH THREE TIMES DAILY 540 capsule 1   pantoprazole (PROTONIX) 20 MG tablet Take 1 tablet (20 mg total) by mouth daily.     potassium chloride SA (KLOR-CON M) 20 MEQ tablet TAKE ONE TABLET BY MOUTH ONCE A DAY 60 tablet 1   Prednisolon-Moxiflox-Bromfenac 1-0.5-0.075 % SOLN Place 1 drop into the right eye 4 (four) times daily.     No current facility-administered medications for this visit.    Allergies:   Sulfa antibiotics, Sulfonamide derivatives, Topamax [topiramate], Clarithromycin, Hydrocodone, Motrin [ibuprofen], Misc. sulfonamide containing compounds, Symbicort [budesonide-formoterol fumarate], Tetracycline hcl, Percocet [oxycodone-acetaminophen], Tape, and Tetracyclines & related    Social History:  The patient  reports that she has never smoked. She has never used smokeless tobacco. She reports that she does not drink alcohol and does not use drugs.   Family History:  The patient's family history includes Breast cancer in her cousin; Breast cancer (age of onset: 25) in her sister; Cancer in her maternal grandfather, maternal grandmother, maternal uncle, maternal uncle, and maternal uncle; Colon cancer in her sister; Colon cancer (age of onset: 13) in her cousin; Emphysema in her mother; Liver cancer in her maternal aunt; Lung cancer (age of onset: 40) in her cousin; Lung cancer (age of onset: 87) in her father; Melanoma in her maternal aunt; Myasthenia gravis in her paternal aunt; Osteoporosis in her paternal aunt; Parkinson's disease in her cousin; Stroke in her paternal aunt.    ROS:  Please see the history of present illness.   Otherwise, review of systems are positive for none.   All other systems are reviewed and negative.    PHYSICAL EXAM: VS:  BP 120/74 (BP Location: Right Arm, Patient Position: Sitting, Cuff  Size: Normal)   Pulse (!) 141   Ht 5\' 2"  (1.575 m)   Wt 120 lb 2 oz (54.5 kg)   SpO2 97%   BMI 21.97 kg/m  , BMI Body mass index is 21.97 kg/m. GEN: Well nourished, well developed, in no acute distress  HEENT: normal  Neck: No JVD, carotid bruits, or masses Cardiac: Irregular irregular and tachycardic; no rubs, or gallops .  No murmurs.  Mild bilateral leg edema Respiratory:  clear to auscultation bilaterally, normal work of breathing GI: soft, nontender, nondistended, + BS MS: no deformity  or atrophy  Skin: warm and dry, no rash Neuro:  Strength and sensation are intact Psych: euthymic mood, full affect   EKG:  EKG is ordered today. EKG showed: Atrial fibrillation with rapid ventricular response Minimal voltage criteria for LVH, may be normal variant ( Sokolow-Lyon ) Marked ST abnormality, possible lateral subendocardial injury When compared with ECG of 08-Aug-2023 22:40, A-fib with RVR is new    Recent Labs: 12/07/2022: Magnesium 2.2 04/01/2023: B Natriuretic Peptide 681.9 08/05/2023: ALT 2; Pro B Natriuretic peptide (BNP) 484.0 08/09/2023: BUN 23; Creatinine, Ser 0.75; Hemoglobin 12.0; Platelets 216; Potassium 4.0; Sodium 137    Lipid Panel    Component Value Date/Time   CHOL 171 08/08/2021 1320   TRIG 100.0 08/08/2021 1320   HDL 53.30 08/08/2021 1320   CHOLHDL 3 08/08/2021 1320   VLDL 20.0 08/08/2021 1320   LDLCALC 97 08/08/2021 1320   LDLDIRECT 135.0 08/16/2009 1359      Wt Readings from Last 3 Encounters:  08/27/23 120 lb 2 oz (54.5 kg)  08/19/23 113 lb 4 oz (51.4 kg)  08/09/23 117 lb 14.4 oz (53.5 kg)          No data to display            ASSESSMENT AND PLAN:  1.   Atrial fibrillation with rapid ventricular response: She is very symptomatic and I discussed with her management in the inpatient setting but she prefers to avoid hospitalizations.  I elected to start her on metoprolol to tartrate 25 mg twice daily for rate control.  In addition, I  started anticoagulation with Eliquis 2.5 mg twice daily.  The lower dose was used based on her weight and age. If she develops worsening symptoms, I asked her to go to the emergency room. Otherwise, the plan is to bring her back in 1 week for follow-up with anticipation of cardioversion after 3 weeks of anticoagulation.  2. Mitral valve prolapse with severe regurgitation: Status post recent third mitral valve clip with improvement in mitral regurgitation to mild to moderate range.  Most recent echocardiogram in June showed moderate mitral regurgitation with stable gradient.  3.  Acute on chronic diastolic heart failure likely in the setting of A-fib with RVR.  She appears to be mildly volume overloaded.  She did not take her Lasix today and she will resume that when she goes back home.   4.  Parkinson's disease: She reports worsening neurologic symptoms.   Disposition: Follow-up with APP in 1 week.  Signed,  Lorine Bears, MD  08/27/2023 3:24 PM    [facility]

## 2023-08-28 ENCOUNTER — Encounter: Payer: Self-pay | Admitting: Cardiovascular Disease

## 2023-08-28 MED ORDER — METOPROLOL TARTRATE 25 MG PO TABS: 12.5000 mg | ORAL_TABLET | Freq: Two times a day (BID) | ORAL | Status: DC

## 2023-08-29 MED ORDER — METOPROLOL TARTRATE 25 MG PO TABS: 12.5000 mg | ORAL_TABLET | Freq: Two times a day (BID) | ORAL | 0 refills | Status: DC

## 2023-08-29 NOTE — Addendum Note (Signed)
Addended by: Sandi Mariscal on: 08/29/2023 08:50 AM   Modules accepted: Orders

## 2023-09-02 ENCOUNTER — Encounter: Payer: Self-pay | Admitting: Family Medicine

## 2023-09-02 ENCOUNTER — Telehealth: Payer: Self-pay | Admitting: Cardiovascular Disease

## 2023-09-02 ENCOUNTER — Ambulatory Visit: Payer: Medicare PPO | Admitting: Family Medicine

## 2023-09-02 VITALS — BP 112/64 | HR 122 | Temp 97.5°F | Ht 62.0 in | Wt 119.4 lb

## 2023-09-02 DIAGNOSIS — R339 Retention of urine, unspecified: Secondary | ICD-10-CM

## 2023-09-02 DIAGNOSIS — I4891 Unspecified atrial fibrillation: Secondary | ICD-10-CM | POA: Insufficient documentation

## 2023-09-02 DIAGNOSIS — N3000 Acute cystitis without hematuria: Secondary | ICD-10-CM | POA: Diagnosis not present

## 2023-09-02 LAB — POC URINALSYSI DIPSTICK (AUTOMATED)
Bilirubin, UA: NEGATIVE
Blood, UA: NEGATIVE
Glucose, UA: NEGATIVE
Ketones, UA: NEGATIVE
Nitrite, UA: NEGATIVE
Protein, UA: POSITIVE — AB
Spec Grav, UA: 1.02 (ref 1.010–1.025)
Urobilinogen, UA: 0.2 U/dL
pH, UA: 5.5 (ref 5.0–8.0)

## 2023-09-02 MED ORDER — CEPHALEXIN 500 MG PO CAPS: 500.0000 mg | ORAL_CAPSULE | Freq: Two times a day (BID) | ORAL | 0 refills | Status: DC

## 2023-09-02 NOTE — Assessment & Plan Note (Addendum)
Positive urinalysis for wbc Some frequency and bladder pain (some right sided cva pain but no tenderness on exam)  No fever/nausea   Culture pending Will send for culture and update  Instructed to call if symptoms worsen in the meantime   Prescription keflex 500 mg bid for 7 d - she will have this delivered to [facility]  Handout given  Update if not starting to improve in a week or if worsening  Call back and Er precautions noted in detail today

## 2023-09-02 NOTE — Assessment & Plan Note (Addendum)
HR today is 122 (2nd check) with irreg rhythm  Has been unable to take metoprolol due to hypotension so rate if not optimal  Taking eliquis  She is shortness of breath (per pt -this is not more than baseline for her)   Reviewed last cardiology note and several phone notes today   Will cc her cardiologist  She has call into Dr Kirke Corin this am also

## 2023-09-02 NOTE — Progress Notes (Signed)
Subjective:    Patient ID: Crystal Bass, female    DOB: March 06, 1941, 82 y.o.   MRN: [id number]  HPI  Wt Readings from Last 3 Encounters:  09/02/23 119 lb 6 oz (54.1 kg)  08/27/23 120 lb 2 oz (54.5 kg)  08/19/23 113 lb 4 oz (51.4 kg)   21.83 kg/m  Vitals:   09/02/23 1218 09/02/23 1252  BP: 112/64   Pulse: (!) 130 (!) 122  Temp: (!) 97.5 F (36.4 C)   SpO2: 95%    82 yo pt of Dr Reece Agar presents for urinary symptoms incl low back pain and low abd/pelvic pain  Urinary retention   She has history of parkinson and a fib and copd    She thinks her right kidney is bothering her - right flank Was told it was slightly enlarged?  Worse over the weekend  Some pain low over bladder now - last several days     No fever No n/v  No history of renal stone that she remembers   No burning to urinate No blood in urine  Some frequency  (less volume)  More effort to empty bladder/hesitancy   No incontinence  No new constipation     Results for orders placed or performed in visit on 09/02/23  POCT Urinalysis Dipstick (Automated)  Result Value Ref Range   Color, UA Yellow    Clarity, UA Clear    Glucose, UA Negative Negative   Bilirubin, UA Negative    Ketones, UA Negative    Spec Grav, UA 1.020 1.010 - 1.025   Blood, UA Negative    pH, UA 5.5 5.0 - 8.0   Protein, UA Positive (A) Negative   Urobilinogen, UA 0.2 0.2 or 1.0 E.U./dL   Nitrite, UA Negative    Leukocytes, UA Large (3+) (A) Negative   *Note: Due to a large number of results and/or encounters for the requested time period, some results have not been displayed. A complete set of results can be found in Results Review.     Lab Results  Component Value Date   NA 137 08/09/2023   K 4.0 08/09/2023   CO2 28 08/09/2023   GLUCOSE 112 (H) 08/09/2023   BUN 23 08/09/2023   CREATININE 0.75 08/09/2023   CALCIUM 8.6 (L) 08/09/2023   GFR 66.59 08/05/2023   EGFR 84 04/30/2022   GFRNONAA >60 08/09/2023    High  pulse rate today  Pulse Readings from Last 3 Encounters:  09/02/23 (!) 122  08/27/23 (!) 141  08/19/23 67   Has a fib  Metoprolol 12.5 mg bid is on med list- not taking due to low bp Also eliquis   Has apt with cardiology on 10/11  Cardiologist is Dr Kirke Corin  Also DD and MVP  Had CAP in late sept  Treatment with azith/cefdinir     Patient Active Problem List   Diagnosis Date Noted   Acute cystitis 09/02/2023   Atrial fibrillation (HCC) 09/02/2023   Daytime somnolence 08/19/2023   Pulmonary nodules 08/09/2023   Frailty 08/09/2023   COVID-19 07/08/2023   Hypervolemia 02/08/2023   Chronic heart failure with preserved ejection fraction (HFpEF) (HCC) 08/20/2022   Closed left hip fracture (HCC) 08/19/2022   Severe mitral regurgitation 02/15/2022   S/P mitral valve clip implantation 02/15/2022   Ovarian tumor of borderline malignancy, right 10/28/2020   Angioedema 05/04/2020   Chronic neuropathic pain 01/27/2020   Acute midline low back pain with right-sided sciatica 10/26/2019   [name] (mycobacterium  avium-intracellulare) infection (HCC) 11/01/2018   History of pulmonary embolism 10/31/2018   Orthostatic hypotension 08/25/2018   Chronic fatigue 10/23/2017   Parkinson disease (HCC) 01/10/2016   Hypercalcemia 09/14/2013   Osteoporosis, post-menopausal 03/14/2012   Dyspnea on exertion, acute on chronic 03/28/2011   CAP (community acquired pneumonia) 09/27/2009   COPD (chronic obstructive pulmonary disease) (HCC) 03/25/2008   History of left breast cancer 03/10/2008   Essential hypertension 02/10/2007   Past Medical History:  Diagnosis Date   Aneurysm of aorta (HCC)    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   Anginal pain (HCC)    a. 10/2021 Cath: Nl cors.   Arthritis    Asthma    Breast cancer (HCC) 2002   infiltrative ductal carcinoma    Cataract 2019   Chronic heart failure with preserved ejection fraction (HFpEF) (HCC)    COPD (chronic obstructive pulmonary disease)  (HCC) 07/2007   Coronary artery disease    Ductal carcinoma of breast, estrogen receptor positive, stage 1 (HCC) 09/16/2012   Endometriosis    GERD (gastroesophageal reflux disease)    Hx of mitral valve repair    Hypercalcemia 09/14/2013   Hypertension    Lesion of breast 1992   right, benign   Lung disease    secondary to [name] infection   Mastalgia 05/1994   Mitral regurgitation    a. 10/2021 RHC: PCWP V wave of 34; b. 01/2022 TEE: Severe flail/prolapse of large area of A2 scallop with severe posteriorly directed MR; c. 01/2022 TEER w /Mitraclip XTW x 2; d. 07/2022 Echo: EF 60-65%. No rwma, mild LVH, GrIII DD. Sev dil LA. Mod elev PASP. Mod-Sev MR. Mod MS.   Moderate mitral regurgitation by prior echocardiogram    Osteoporosis, post-menopausal 03/14/2012   Ovarian tumor of borderline malignancy, right 2004   Palpitations    a. 02/2022 Zio: Predominantly sinus rhythm at 67 (52-171).  2 runs of NSVT -fastest 9 beats at 167.  35 SVT runs-longest 19 beats, fastest 171x6 beats.   Pancreatitis    secondary to Cholelithiasis   Parkinson disease (HCC) 02/10/2015   Post-thoracotomy pain syndrome    Pseudomonas pneumonia (HCC) 03/2008   Status post implantation of mitral valve leaflet clip 02/15/2022   s/p XTW MitraClip x2 by Dr. Excell Seltzer in the A2/P2: b. 04/03/23 s/p third mitraclip implantation.   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   APPENDECTOMY  2004   BREAST BIOPSY  08/2004   right breast-benign   BREAST LUMPECTOMY  1992   benign   BUBBLE STUDY  12/27/2021   Procedure: BUBBLE STUDY;  Surgeon: Sande Rives, MD;  Location: [facility] ENDOSCOPY;  Service: Cardiovascular;;   CARDIAC CATHETERIZATION  2007   essentially negation for  significant CAD   CATARACT EXTRACTION W/PHACO Left 07/24/2023   Procedure: CATARACT EXTRACTION PHACO AND INTRAOCULAR LENS PLACEMENT (IOC) LEFT 5.34 0037.3;  Surgeon: Lockie Mola, MD;  Location: [facility] SURGERY CNTR;  Service: Ophthalmology;  Laterality: Left;   CATARACT EXTRACTION W/PHACO Right 08/07/2023   Procedure: CATARACT EXTRACTION PHACO AND INTRAOCULAR LENS PLACEMENT (IOC) RIGHT 7.83 00:47.9;  Surgeon: Lockie Mola, MD;  Location: [facility] SURGERY CNTR;  Service: Ophthalmology;  Laterality: Right;   CHOLECYSTECTOMY  2001  COLONOSCOPY  2014   Dr Marina Goodell , due 2019   COLONOSCOPY  09/2018   2 TA removed, int hem, no f/u needed (Dr Marina Goodell)    ESOPHAGOGASTRODUODENOSCOPY  09/2018   GERD with esophagitis and stricture dilated, antral gastritis negative for H pylori Marina Goodell)   HIP ARTHROPLASTY Left 08/21/2022   Procedure: ARTHROPLASTY BIPOLAR HIP (HEMIARTHROPLASTY);  Surgeon: Juanell Fairly, MD;  Location: [facility] ORS;  Service: Orthopedics;  Laterality: Left;   LUNG BIOPSY  2002   [name], Dr Edwyna Shell   MASTECTOMY MODIFIED RADICAL Left 2002   oral chemotheraphy (tamoxifen then Armidex) no radiation, Dr.[name]   MITRAL VALVE REPAIR N/A 02/15/2022   Procedure: MITRAL VALVE REPAIR;  Surgeon: Tonny Bollman, MD;  Location: [facility] INVASIVE CV LAB;  Service: Cardiovascular;  Laterality: N/A;   MITRAL VALVE REPAIR N/A 04/03/2023   Procedure: MITRAL VALVE REPAIR;  Surgeon: Tonny Bollman, MD;  Location: [facility] INVASIVE CV LAB;  Service: Cardiovascular;  Laterality: N/A;   RIGHT/LEFT HEART CATH AND CORONARY ANGIOGRAPHY Bilateral 11/14/2021   Procedure: RIGHT/LEFT HEART CATH AND CORONARY ANGIOGRAPHY;  Surgeon: Iran Ouch, MD;  Location: [facility] INVASIVE CV LAB;  Service: Cardiovascular;  Laterality: Bilateral;   TEE WITHOUT CARDIOVERSION N/A 12/29/2018   Procedure: TRANSESOPHAGEAL ECHOCARDIOGRAM (TEE);  Surgeon: Iran Ouch, MD;  Location: [facility] ORS;  Service: Cardiovascular;  Laterality: N/A;    TEE WITHOUT CARDIOVERSION N/A 12/27/2021   Procedure: TRANSESOPHAGEAL ECHOCARDIOGRAM (TEE);  Surgeon: Sande Rives, MD;  Location: [facility] ENDOSCOPY;  Service: Cardiovascular;  Laterality: N/A;   TEE WITHOUT CARDIOVERSION N/A 02/15/2022   Procedure: TRANSESOPHAGEAL ECHOCARDIOGRAM (TEE);  Surgeon: Tonny Bollman, MD;  Location: [facility] INVASIVE CV LAB;  Service: Cardiovascular;  Laterality: N/A;   TEE WITHOUT CARDIOVERSION N/A 05/14/2022   Procedure: TRANSESOPHAGEAL ECHOCARDIOGRAM (TEE);  Surgeon: Sande Rives, MD;  Location: [facility] ENDOSCOPY;  Service: Cardiovascular;  Laterality: N/A;   TEE WITHOUT CARDIOVERSION N/A 04/03/2023   Procedure: TRANSESOPHAGEAL ECHOCARDIOGRAM;  Surgeon: Tonny Bollman, MD;  Location: [facility] INVASIVE CV LAB;  Service: Cardiovascular;  Laterality: N/A;   TONSILLECTOMY  1946   Social History   Tobacco Use   Smoking status: Never   Smokeless tobacco: Never  Vaping Use   Vaping status: Never Used  Substance Use Topics   Alcohol use: No    Alcohol/week: 0.0 standard drinks of alcohol   Drug use: No   Family History  Problem Relation Age of Onset   Emphysema Mother        d.64 was never a smoker   Lung cancer Father 53       d.82 history of smoking   Breast cancer Sister 86   Colon cancer Sister    Melanoma Maternal Aunt    Liver cancer Maternal Aunt    Cancer Maternal Uncle        unspecified type   Cancer Maternal Uncle        unspecified type   Cancer Maternal Uncle        unspecified type   Stroke Paternal Aunt        in mid 30s   Osteoporosis Paternal Aunt    Myasthenia gravis Paternal Aunt    Cancer Maternal Grandmother        d.82s unspecified GI cancer   Cancer Maternal Grandfather        d.62s unspecified type   Breast cancer Cousin        d.60s-daughter of unaffected paternal aunt Doristine Church   Colon cancer Cousin 60  d.70-daughter of unaffected maternal aunt Leila   Lung cancer Cousin 70       d.70-sisters to each other, both daughters of  maternal uncle Johnny   Parkinson's disease Cousin    Diabetes Neg Hx    Heart disease Neg Hx    Stomach cancer Neg Hx    Ulcerative colitis Neg Hx    Allergies  Allergen Reactions   Sulfa Antibiotics Anaphylaxis   Sulfonamide Derivatives Anaphylaxis   Topamax [Topiramate] Other (See Comments)    Metabolic acidosis    Clarithromycin Other (See Comments)    pericarditis Other reaction(s): Not available   Hydrocodone Other (See Comments)    "hyper and climbing the walls"   Motrin [Ibuprofen] Other (See Comments)    headaches   Misc. Sulfonamide Containing Compounds     Other reaction(s): Not available   Symbicort [Budesonide-Formoterol Fumarate] Other (See Comments)    02/07/15 tremor   Tetracycline Hcl Other (See Comments)   Percocet [Oxycodone-Acetaminophen] Itching and Rash   Tape Itching and Rash    Use paper tape only   Tetracyclines & Related Other (See Comments)    "immediate yeast infection"   Current Outpatient Medications on File Prior to Visit  Medication Sig Dispense Refill   acetaminophen (TYLENOL) 500 MG tablet Take 1,000 mg by mouth 3 (three) times daily.     albuterol (VENTOLIN HFA) 108 (90 Base) MCG/ACT inhaler Inhale 2 puffs into the lungs every 6 (six) hours as needed for wheezing or shortness of breath. 18 g 1   apixaban (ELIQUIS) 2.5 MG TABS tablet Take 1 tablet (2.5 mg total) by mouth 2 (two) times daily. 180 tablet 3   calcium carbonate (TUMS - DOSED IN MG ELEMENTAL CALCIUM) 500 MG chewable tablet Chew 2 tablets by mouth daily as needed for indigestion or heartburn.     Carbidopa-Levodopa ER (SINEMET CR) 25-100 MG tablet controlled release TAKE TWO TABLET BY MOUTH AT NINE IN THE MORNING, ONE TAB AT ONE IN THE EVENING AND ONE TAB AT FIVE IN THE EVENING 360 tablet 1   Cholecalciferol (VITAMIN D3) 25 MCG (1000 UT) CAPS Take 1 capsule (1,000 Units total) by mouth daily. 30 capsule    clonazePAM (KLONOPIN) 0.5 MG tablet TAKE ONE HALF (1/2) A TABLET BY MOUTH AT  BEDTIME 45 tablet 0   EPINEPHrine 0.3 mg/0.3 mL IJ SOAJ injection Inject 0.3 mg into the muscle as needed for anaphylaxis.     Fexofenadine HCl (ALLEGRA PO) Take by mouth daily at 8 pm.     Fluticasone-Umeclidin-Vilant (TRELEGY ELLIPTA) 100-62.5-25 MCG/ACT AEPB Inhale 1 puff into the lungs daily. 1 each 6   furosemide (LASIX) 40 MG tablet TAKE ONE TABLET BY MOUTH ONCE DAILY *NEW DOSE* 90 tablet 1   gabapentin ([name]) 300 MG capsule TAKE TWO CAPSULES BY MOUTH THREE TIMES DAILY 540 capsule 1   pantoprazole (PROTONIX) 20 MG tablet Take 1 tablet (20 mg total) by mouth daily.     potassium chloride SA (KLOR-CON M) 20 MEQ tablet TAKE ONE TABLET BY MOUTH ONCE A DAY 60 tablet 1   Prednisolon-Moxiflox-Bromfenac 1-0.5-0.075 % SOLN Place 1 drop into the right eye 4 (four) times daily.     metoprolol tartrate (LOPRESSOR) 25 MG tablet Take 0.5 tablets (12.5 mg total) by mouth 2 (two) times daily. (Patient not taking: Reported on 09/02/2023) 45 tablet 0   No current facility-administered medications on file prior to visit.    Review of Systems  Constitutional:  Positive for fatigue. Negative for activity  change, appetite change, fever and unexpected weight change.  HENT:  Negative for congestion, ear pain, rhinorrhea, sinus pressure and sore throat.   Eyes:  Negative for pain, redness and visual disturbance.  Respiratory:  Negative for cough, shortness of breath and wheezing.        Per pt shortness of breath on exertion is her baseline   Cardiovascular:  Positive for palpitations. Negative for chest pain.  Gastrointestinal:  Negative for abdominal pain, blood in stool, constipation and diarrhea.  Endocrine: Negative for polydipsia and polyuria.  Genitourinary:  Positive for difficulty urinating, frequency and pelvic pain. Negative for dysuria, hematuria and urgency.  Musculoskeletal:  Negative for arthralgias, back pain and myalgias.  Skin:  Negative for pallor and rash.  Allergic/Immunologic:  Negative for environmental allergies.  Neurological:  Negative for dizziness, syncope and headaches.  Hematological:  Negative for adenopathy. Does not bruise/bleed easily.  Psychiatric/Behavioral:  Negative for decreased concentration and dysphoric mood. The patient is not nervous/anxious.        Objective:   Physical Exam Constitutional:      General: She is not in acute distress.    Appearance: Normal appearance. She is well-developed and normal weight. She is not ill-appearing or diaphoretic.     Comments: Frail appearing   HENT:     Head: Normocephalic and atraumatic.     Mouth/Throat:     Mouth: Mucous membranes are moist.  Eyes:     General:        Right eye: No discharge.        Left eye: No discharge.     Conjunctiva/sclera: Conjunctivae normal.     Pupils: Pupils are equal, round, and reactive to light.  Neck:     Thyroid: No thyromegaly.     Vascular: No carotid bruit or JVD.  Cardiovascular:     Rate and Rhythm: Tachycardia present. Rhythm irregular.     Heart sounds: Murmur heard.     No gallop.  Pulmonary:     Effort: Pulmonary effort is normal. No respiratory distress.     Breath sounds: Normal breath sounds. No wheezing or rales.  Abdominal:     General: Bowel sounds are normal. There is no distension or abdominal bruit.     Palpations: Abdomen is soft. There is no pulsatile mass.     Tenderness: There is abdominal tenderness in the suprapubic area. There is no right CVA tenderness or left CVA tenderness.     Comments: Some mild suprapubic tenderness- more on the right side Bladder does not feel hyperdistended   No CVA tenderness   Musculoskeletal:     Cervical back: Normal range of motion and neck supple.     Right lower leg: No edema.     Left lower leg: No edema.  Lymphadenopathy:     Cervical: No cervical adenopathy.  Skin:    General: Skin is warm and dry.     Coloration: Skin is not pale.     Findings: No rash.  Neurological:     Mental  Status: She is alert.     Coordination: Coordination normal.     Deep Tendon Reflexes: Reflexes are normal and symmetric. Reflexes normal.     Comments: Some bradykinesia  Walks with walker   Psychiatric:        Mood and Affect: Mood normal.           Assessment & Plan:   Problem List Items Addressed This Visit  Cardiovascular and Mediastinum   Atrial fibrillation (HCC)    HR today is 122 (2nd check) with irreg rhythm  Has been unable to take metoprolol due to hypotension so rate if not optimal  Taking eliquis  She is shortness of breath (per pt -this is not more than baseline for her)   Will cc her cardiologist  She has call into Dr Kirke Corin this am also         Genitourinary   Acute cystitis - Primary    Positive urinalysis for wbc Some frequency and bladder pain (some right sided cva pain but no tenderness on exam)  No fever/nausea   Culture pending Will send for culture and update  Instructed to call if symptoms worsen in the meantime   Prescription keflex 500 mg bid for 7 d - she will have this delivered to [facility]  Handout given  Update if not starting to improve in a week or if worsening  Call back and Er precautions noted in detail today         Relevant Orders   Urine Culture   Other Visit Diagnoses     Urinary retention       Relevant Orders   POCT Urinalysis Dipstick (Automated) (Completed)

## 2023-09-02 NOTE — Telephone Encounter (Signed)
Called patient, she states that she is having some issues with swelling in both legs. She states she is concerned that it was improving over night with elevation, however now it is staying the same. She is SOB but this is normal for her, she takes 40 mg lasix every morning (she just took her dosage for today), she is no longer taking the Metoprolol because it caused issues with her blood pressure (see mychart messages). Patients weight is between 119-120 lb at home. At visit with Dr.Arida on 10/01 she weighed 120 lbs, but he did mention she was fluid overload a bit at this visit, this seemed to have worsened per patient.   She does have appointment scheduled for 10/11 with Carlos Levering, NP.   Patient was going to check BP/HR this morning and give updated readings as she had not checked her vitals yet this morning. I attempted to contact patient to get updated readings, was unable to reach her during this time. Can try again.  Advised with patient I would reach out to Dr.Arida and see what he recommended and we would call her back.

## 2023-09-02 NOTE — Telephone Encounter (Signed)
Pt c/o swelling/edema: STAT if pt has developed SOB within 24 hours  If swelling, where is the swelling located?   Feet, ankles and knees  How much weight have you gained and in what time span?   Have you gained 2 pounds in a day or 5 pounds in a week?   7 lbs within a week or 2.  Patient stated her weight is 119-121 lbs this morning  Do you have a log of your daily weights (if so, list)?   Not recently  Are you currently taking a fluid pill?   Yes  Are you currently SOB?   Yes but patient stated that is not unusual  Have you traveled recently in a car or plane for an extended period of time?   Patient is concerned she is having increased swelling and wants to know next steps.

## 2023-09-02 NOTE — Telephone Encounter (Signed)
The patient returned call to give BP readings. BP was 90/70 while seated, oxygen was 99% and pulse was 106 and went down to 88. The nurse from the facility stated her breathing was 22 a min. Please advise.

## 2023-09-02 NOTE — Patient Instructions (Addendum)
I think you have a urinary tract infection   Take the generic keflex as directed for this  If your symptoms worsen in the meantime please let me know   We will get a culture and call you with the result in a few days   Make sure you drink water     I will let your cardiologist know that your pulse rate is high today

## 2023-09-02 NOTE — Telephone Encounter (Signed)
Noted, thank you- will route to MD to review and give recommendations.

## 2023-09-03 LAB — URINE CULTURE
MICRO NUMBER:: 15560802
Result:: NO GROWTH
SPECIMEN QUALITY:: ADEQUATE

## 2023-09-04 ENCOUNTER — Emergency Department: Payer: Medicare PPO

## 2023-09-04 ENCOUNTER — Encounter: Payer: Self-pay | Admitting: Medical Oncology

## 2023-09-04 ENCOUNTER — Inpatient Hospital Stay
Admission: EM | Admit: 2023-09-04 | Discharge: 2023-09-09 | DRG: 871 | Disposition: A | Discharge status: Skilled Nursing Facility | Payer: Medicare PPO | Attending: Internal Medicine | Admitting: Internal Medicine

## 2023-09-04 ENCOUNTER — Other Ambulatory Visit: Payer: Self-pay

## 2023-09-04 DIAGNOSIS — I351 Nonrheumatic aortic (valve) insufficiency: Secondary | ICD-10-CM | POA: Diagnosis not present

## 2023-09-04 DIAGNOSIS — I4891 Unspecified atrial fibrillation: Principal | ICD-10-CM | POA: Diagnosis present

## 2023-09-04 DIAGNOSIS — Z885 Allergy status to narcotic agent status: Secondary | ICD-10-CM

## 2023-09-04 DIAGNOSIS — R0602 Shortness of breath: Secondary | ICD-10-CM | POA: Diagnosis not present

## 2023-09-04 DIAGNOSIS — J9601 Acute respiratory failure with hypoxia: Secondary | ICD-10-CM | POA: Diagnosis present

## 2023-09-04 DIAGNOSIS — Z91048 Other nonmedicinal substance allergy status: Secondary | ICD-10-CM

## 2023-09-04 DIAGNOSIS — Z6821 Body mass index (BMI) 21.0-21.9, adult: Secondary | ICD-10-CM

## 2023-09-04 DIAGNOSIS — G20A1 Parkinson's disease without dyskinesia, without mention of fluctuations: Secondary | ICD-10-CM | POA: Diagnosis present

## 2023-09-04 DIAGNOSIS — R400 Somnolence: Secondary | ICD-10-CM | POA: Diagnosis present

## 2023-09-04 DIAGNOSIS — Z95818 Presence of other cardiac implants and grafts: Secondary | ICD-10-CM

## 2023-09-04 DIAGNOSIS — I2489 Other forms of acute ischemic heart disease: Secondary | ICD-10-CM | POA: Diagnosis not present

## 2023-09-04 DIAGNOSIS — Z7951 Long term (current) use of inhaled steroids: Secondary | ICD-10-CM

## 2023-09-04 DIAGNOSIS — Z66 Do not resuscitate: Secondary | ICD-10-CM | POA: Diagnosis present

## 2023-09-04 DIAGNOSIS — J440 Chronic obstructive pulmonary disease with acute lower respiratory infection: Secondary | ICD-10-CM | POA: Diagnosis not present

## 2023-09-04 DIAGNOSIS — E43 Unspecified severe protein-calorie malnutrition: Secondary | ICD-10-CM | POA: Diagnosis not present

## 2023-09-04 DIAGNOSIS — Z79899 Other long term (current) drug therapy: Secondary | ICD-10-CM | POA: Diagnosis not present

## 2023-09-04 DIAGNOSIS — Z9012 Acquired absence of left breast and nipple: Secondary | ICD-10-CM | POA: Diagnosis not present

## 2023-09-04 DIAGNOSIS — A419 Sepsis, unspecified organism: Secondary | ICD-10-CM | POA: Diagnosis not present

## 2023-09-04 DIAGNOSIS — Z9049 Acquired absence of other specified parts of digestive tract: Secondary | ICD-10-CM

## 2023-09-04 DIAGNOSIS — I10 Essential (primary) hypertension: Secondary | ICD-10-CM | POA: Diagnosis present

## 2023-09-04 DIAGNOSIS — J449 Chronic obstructive pulmonary disease, unspecified: Secondary | ICD-10-CM | POA: Diagnosis present

## 2023-09-04 DIAGNOSIS — Z882 Allergy status to sulfonamides status: Secondary | ICD-10-CM

## 2023-09-04 DIAGNOSIS — I951 Orthostatic hypotension: Secondary | ICD-10-CM | POA: Diagnosis present

## 2023-09-04 DIAGNOSIS — I110 Hypertensive heart disease with heart failure: Secondary | ICD-10-CM | POA: Diagnosis present

## 2023-09-04 DIAGNOSIS — I5033 Acute on chronic diastolic (congestive) heart failure: Secondary | ICD-10-CM | POA: Diagnosis present

## 2023-09-04 DIAGNOSIS — R918 Other nonspecific abnormal finding of lung field: Secondary | ICD-10-CM | POA: Diagnosis not present

## 2023-09-04 DIAGNOSIS — J9811 Atelectasis: Secondary | ICD-10-CM | POA: Diagnosis not present

## 2023-09-04 DIAGNOSIS — K2100 Gastro-esophageal reflux disease with esophagitis, without bleeding: Secondary | ICD-10-CM | POA: Diagnosis present

## 2023-09-04 DIAGNOSIS — I509 Heart failure, unspecified: Secondary | ICD-10-CM

## 2023-09-04 DIAGNOSIS — Z90710 Acquired absence of both cervix and uterus: Secondary | ICD-10-CM | POA: Diagnosis not present

## 2023-09-04 DIAGNOSIS — Q2119 Other specified atrial septal defect: Secondary | ICD-10-CM | POA: Diagnosis not present

## 2023-09-04 DIAGNOSIS — J479 Bronchiectasis, uncomplicated: Secondary | ICD-10-CM | POA: Diagnosis not present

## 2023-09-04 DIAGNOSIS — I340 Nonrheumatic mitral (valve) insufficiency: Secondary | ICD-10-CM | POA: Diagnosis not present

## 2023-09-04 DIAGNOSIS — R0603 Acute respiratory distress: Secondary | ICD-10-CM | POA: Diagnosis present

## 2023-09-04 DIAGNOSIS — R29898 Other symptoms and signs involving the musculoskeletal system: Secondary | ICD-10-CM | POA: Diagnosis not present

## 2023-09-04 DIAGNOSIS — Z961 Presence of intraocular lens: Secondary | ICD-10-CM | POA: Diagnosis present

## 2023-09-04 DIAGNOSIS — R531 Weakness: Secondary | ICD-10-CM | POA: Diagnosis not present

## 2023-09-04 DIAGNOSIS — Z803 Family history of malignant neoplasm of breast: Secondary | ICD-10-CM

## 2023-09-04 DIAGNOSIS — I2510 Atherosclerotic heart disease of native coronary artery without angina pectoris: Secondary | ICD-10-CM | POA: Diagnosis present

## 2023-09-04 DIAGNOSIS — I4819 Other persistent atrial fibrillation: Secondary | ICD-10-CM | POA: Diagnosis present

## 2023-09-04 DIAGNOSIS — Z825 Family history of asthma and other chronic lower respiratory diseases: Secondary | ICD-10-CM

## 2023-09-04 DIAGNOSIS — J189 Pneumonia, unspecified organism: Secondary | ICD-10-CM | POA: Diagnosis present

## 2023-09-04 DIAGNOSIS — Z853 Personal history of malignant neoplasm of breast: Secondary | ICD-10-CM

## 2023-09-04 DIAGNOSIS — Z888 Allergy status to other drugs, medicaments and biological substances status: Secondary | ICD-10-CM

## 2023-09-04 DIAGNOSIS — Z9889 Other specified postprocedural states: Secondary | ICD-10-CM

## 2023-09-04 DIAGNOSIS — Z881 Allergy status to other antibiotic agents status: Secondary | ICD-10-CM

## 2023-09-04 DIAGNOSIS — Z7401 Bed confinement status: Secondary | ICD-10-CM | POA: Diagnosis not present

## 2023-09-04 DIAGNOSIS — Z7901 Long term (current) use of anticoagulants: Secondary | ICD-10-CM | POA: Diagnosis not present

## 2023-09-04 DIAGNOSIS — M810 Age-related osteoporosis without current pathological fracture: Secondary | ICD-10-CM | POA: Diagnosis present

## 2023-09-04 DIAGNOSIS — J441 Chronic obstructive pulmonary disease with (acute) exacerbation: Secondary | ICD-10-CM

## 2023-09-04 LAB — URINALYSIS, ROUTINE W REFLEX MICROSCOPIC
Bilirubin Urine: NEGATIVE
Glucose, UA: NEGATIVE mg/dL
Hgb urine dipstick: NEGATIVE
Ketones, ur: NEGATIVE mg/dL
Leukocytes,Ua: NEGATIVE
Nitrite: NEGATIVE
Protein, ur: NEGATIVE mg/dL
Specific Gravity, Urine: 1.008 (ref 1.005–1.030)
pH: 5 (ref 5.0–8.0)

## 2023-09-04 LAB — BLOOD GAS, VENOUS
Acid-base deficit: 2.2 mmol/L — ABNORMAL HIGH (ref 0.0–2.0)
Bicarbonate: 23.2 mmol/L (ref 20.0–28.0)
O2 Saturation: 69.3 %
Patient temperature: 37
pCO2, Ven: 41 mm[Hg] — ABNORMAL LOW (ref 44–60)
pH, Ven: 7.36 (ref 7.25–7.43)
pO2, Ven: 46 mm[Hg] — ABNORMAL HIGH (ref 32–45)

## 2023-09-04 LAB — BASIC METABOLIC PANEL
Anion gap: 7 (ref 5–15)
BUN: 24 mg/dL — ABNORMAL HIGH (ref 8–23)
CO2: 23 mmol/L (ref 22–32)
Calcium: 9.7 mg/dL (ref 8.9–10.3)
Chloride: 103 mmol/L (ref 98–111)
Creatinine, Ser: 0.98 mg/dL (ref 0.44–1.00)
GFR, Estimated: 58 mL/min — ABNORMAL LOW (ref >60)
Glucose, Bld: 109 mg/dL — ABNORMAL HIGH (ref 70–99)
Potassium: 4.9 mmol/L (ref 3.5–5.1)
Sodium: 133 mmol/L — ABNORMAL LOW (ref 135–145)

## 2023-09-04 LAB — BRAIN NATRIURETIC PEPTIDE: B Natriuretic Peptide: 1724.3 pg/mL — ABNORMAL HIGH (ref 0.0–100.0)

## 2023-09-04 LAB — CBC
HCT: 40.5 % (ref 36.0–46.0)
Hemoglobin: 12.7 g/dL (ref 12.0–15.0)
MCH: 32.6 pg (ref 26.0–34.0)
MCHC: 31.4 g/dL (ref 30.0–36.0)
MCV: 104.1 fL — ABNORMAL HIGH (ref 80.0–100.0)
Platelets: 165 10*3/uL (ref 150–400)
RBC: 3.89 MIL/uL (ref 3.87–5.11)
RDW: 15.3 % (ref 11.5–15.5)
WBC: 6.2 10*3/uL (ref 4.0–10.5)
nRBC: 0 % (ref 0.0–0.2)

## 2023-09-04 LAB — LACTIC ACID, PLASMA: Lactic Acid, Venous: 1.8 mmol/L (ref 0.5–1.9)

## 2023-09-04 LAB — PROCALCITONIN: Procalcitonin: <0.1 ng/mL

## 2023-09-04 LAB — TROPONIN I (HIGH SENSITIVITY): Troponin I (High Sensitivity): 22 ng/L — ABNORMAL HIGH (ref <18)

## 2023-09-04 LAB — MAGNESIUM: Magnesium: 2.1 mg/dL (ref 1.7–2.4)

## 2023-09-04 LAB — PROTIME-INR
INR: 3.6 — ABNORMAL HIGH (ref 0.8–1.2)
Prothrombin Time: 35.8 s — ABNORMAL HIGH (ref 11.4–15.2)

## 2023-09-04 MED ORDER — ALBUTEROL SULFATE HFA 108 (90 BASE) MCG/ACT IN AERS: 2.0000 | INHALATION_SPRAY | Freq: Four times a day (QID) | RESPIRATORY_TRACT | Status: DC | PRN

## 2023-09-04 MED ORDER — ONDANSETRON HCL 4 MG/2ML IJ SOLN: 4.0000 mg | Freq: Four times a day (QID) | INTRAMUSCULAR | Status: DC | PRN

## 2023-09-04 MED ORDER — SODIUM CHLORIDE 0.9 % IV SOLN
2.0000 g | INTRAVENOUS | Status: AC
  Administered 2023-09-04 – 2023-09-08 (×5): 2 g via INTRAVENOUS
  Filled 2023-09-04 (×6): qty 20

## 2023-09-04 MED ORDER — CLONAZEPAM 0.5 MG PO TABS
0.5000 mg | ORAL_TABLET | Freq: Every day | ORAL | Status: DC
  Administered 2023-09-04 – 2023-09-05 (×2): 0.5 mg via ORAL
  Administered 2023-09-06: 0.25 mg via ORAL
  Administered 2023-09-07 – 2023-09-08 (×2): 0.5 mg via ORAL
  Filled 2023-09-04 (×5): qty 1

## 2023-09-04 MED ORDER — SODIUM CHLORIDE 0.9 % IV SOLN
500.0000 mg | INTRAVENOUS | Status: DC
  Administered 2023-09-04 – 2023-09-06 (×3): 500 mg via INTRAVENOUS
  Filled 2023-09-04 (×3): qty 5

## 2023-09-04 MED ORDER — CARBIDOPA-LEVODOPA ER 25-100 MG PO TBCR: 2.0000 | EXTENDED_RELEASE_TABLET | Freq: Two times a day (BID) | ORAL | Status: DC

## 2023-09-04 MED ORDER — CARBIDOPA-LEVODOPA ER 25-100 MG PO TBCR
1.0000 | EXTENDED_RELEASE_TABLET | Freq: Two times a day (BID) | ORAL | Status: DC
  Administered 2023-09-04 – 2023-09-09 (×9): 1 via ORAL
  Filled 2023-09-04 (×9): qty 1

## 2023-09-04 MED ORDER — APIXABAN 2.5 MG PO TABS
2.5000 mg | ORAL_TABLET | Freq: Two times a day (BID) | ORAL | Status: DC
  Administered 2023-09-04 – 2023-09-09 (×10): 2.5 mg via ORAL
  Filled 2023-09-04 (×10): qty 1

## 2023-09-04 MED ORDER — CARBIDOPA-LEVODOPA ER 25-100 MG PO TBCR
2.0000 | EXTENDED_RELEASE_TABLET | Freq: Every day | ORAL | Status: DC
  Administered 2023-09-05 – 2023-09-09 (×5): 2 via ORAL
  Filled 2023-09-04 (×6): qty 2

## 2023-09-04 MED ORDER — DILTIAZEM HCL 25 MG/5ML IV SOLN
10.0000 mg | Freq: Once | INTRAVENOUS | Status: AC
  Administered 2023-09-04: 10 mg via INTRAVENOUS
  Filled 2023-09-04: qty 5

## 2023-09-04 MED ORDER — ONDANSETRON HCL 4 MG PO TABS: 4.0000 mg | ORAL_TABLET | Freq: Four times a day (QID) | ORAL | Status: DC | PRN

## 2023-09-04 MED ORDER — FUROSEMIDE 10 MG/ML IJ SOLN
40.0000 mg | Freq: Two times a day (BID) | INTRAMUSCULAR | Status: DC
  Administered 2023-09-05 (×2): 40 mg via INTRAVENOUS
  Filled 2023-09-04 (×3): qty 4

## 2023-09-04 MED ORDER — DILTIAZEM HCL-DEXTROSE 125-5 MG/125ML-% IV SOLN (PREMIX)
5.0000 mg/h | INTRAVENOUS | Status: DC
  Administered 2023-09-04 (×2): 5 mg/h via INTRAVENOUS
  Administered 2023-09-05: 12.5 mg/h via INTRAVENOUS
  Filled 2023-09-04 (×3): qty 125

## 2023-09-04 MED ORDER — POTASSIUM CHLORIDE CRYS ER 20 MEQ PO TBCR
20.0000 meq | EXTENDED_RELEASE_TABLET | Freq: Every day | ORAL | Status: DC
  Administered 2023-09-05: 20 meq via ORAL
  Filled 2023-09-04: qty 1

## 2023-09-04 MED ORDER — FUROSEMIDE 10 MG/ML IJ SOLN
20.0000 mg | Freq: Once | INTRAMUSCULAR | Status: AC
  Administered 2023-09-04: 20 mg via INTRAVENOUS
  Filled 2023-09-04: qty 4

## 2023-09-04 MED ORDER — PREDNISONE 20 MG PO TABS
40.0000 mg | ORAL_TABLET | Freq: Every day | ORAL | Status: AC
  Administered 2023-09-05 – 2023-09-08 (×4): 40 mg via ORAL
  Filled 2023-09-04 (×5): qty 2

## 2023-09-04 MED ORDER — SODIUM CHLORIDE 0.9 % IV SOLN: INTRAVENOUS | Status: DC

## 2023-09-04 MED ORDER — PANTOPRAZOLE SODIUM 20 MG PO TBEC
20.0000 mg | DELAYED_RELEASE_TABLET | Freq: Every day | ORAL | Status: DC
  Administered 2023-09-05 – 2023-09-09 (×5): 20 mg via ORAL
  Filled 2023-09-04 (×5): qty 1

## 2023-09-04 MED ORDER — ACETAMINOPHEN 325 MG PO TABS
650.0000 mg | ORAL_TABLET | Freq: Four times a day (QID) | ORAL | Status: DC | PRN
  Administered 2023-09-05 – 2023-09-08 (×3): 650 mg via ORAL
  Filled 2023-09-04 (×3): qty 2

## 2023-09-04 MED ORDER — METHYLPREDNISOLONE SODIUM SUCC 125 MG IJ SOLR
125.0000 mg | Freq: Two times a day (BID) | INTRAMUSCULAR | Status: AC
  Administered 2023-09-04 – 2023-09-05 (×2): 125 mg via INTRAVENOUS
  Filled 2023-09-04 (×2): qty 2

## 2023-09-04 MED ORDER — LEVALBUTEROL HCL 0.63 MG/3ML IN NEBU: 0.6300 mg | INHALATION_SOLUTION | Freq: Four times a day (QID) | RESPIRATORY_TRACT | Status: DC | PRN

## 2023-09-04 NOTE — Assessment & Plan Note (Signed)
HR into the 140s  Afib w/ RVR on EKG  Started on dilt gtt in ER  Cont xarelto  Cardiology consulted  Follow up recommendations

## 2023-09-04 NOTE — H&P (Addendum)
History and Physical    Patient: Crystal Bass FAO:[id number] DOB: Apr 02, 1941 DOA: 09/04/2023 DOS: the patient was seen and examined on 09/04/2023 PCP: Eustaquio Boyden, MD  Patient coming from: Home  Chief Complaint:  Chief Complaint  Patient presents with   Shortness of Breath   Weakness   HPI: Crystal Bass is a 82 y.o. female with medical history significant of multiple medical issues including Parkinson's disease, COPD, mitral regurgitation status post mitral clip in 2023 with redo May 2024, chronic HFpEF, history PE, CAD, GERD, hypertension presented with acute respiratory failure with hypoxia, A-fib with RVR, acute on chronic HFpEF, pneumonia, COPD exacerbation, sepsis.  Patient reports progressive increased work of breathing over the past 4 to 5 days.  Was seen by cardiology on October 1 for A-fib with RVR.  Started on oral metoprolol as well as Eliquis.  Patient states she has been unable to tolerate the metoprolol secondary to her blood pressure issues.  Positive orthopnea PND.  Mild wheezing.  Positive worsening fatigue.  No fevers or chills.  No frank chest pain.  No abdominal pain nausea or vomiting.  Noted recent admission September 12 through September 14 for pneumonia. Presented to the ER afebrile, heart rate into the 140s, respirations into the 30s, BP stable.  White count 6.2, hemoglobin 12.7, platelets 165, BNP 1724.  Creatinine 0.98.  EKG A-fib with RVR.  Chest x-ray with chronic right medial upper lobe airspace opacity, left basilar retrocardiac opacity. Review of Systems: As mentioned in the history of present illness. All other systems reviewed and are negative. 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 10/2021 Cath: Nl cors.   Arthritis    Asthma    Breast cancer (HCC) 2002   infiltrative ductal carcinoma    Cataract 2019   Chronic heart failure with preserved ejection  fraction (HFpEF) (HCC)    COPD (chronic obstructive pulmonary disease) (HCC) 07/2007   Coronary artery disease    Ductal carcinoma of breast, estrogen receptor positive, stage 1 (HCC) 09/16/2012   Endometriosis    GERD (gastroesophageal reflux disease)    Hx of mitral valve repair    Hypercalcemia 09/14/2013   Hypertension    Lesion of breast 1992   right, benign   Lung disease    secondary to [name] infection   Mastalgia 05/1994   Mitral regurgitation    a. 10/2021 RHC: PCWP V wave of 34; b. 01/2022 TEE: Severe flail/prolapse of large area of A2 scallop with severe posteriorly directed MR; c. 01/2022 TEER w /Mitraclip XTW x 2; d. 07/2022 Echo: EF 60-65%. No rwma, mild LVH, GrIII DD. Sev dil LA. Mod elev PASP. Mod-Sev MR. Mod MS.   Moderate mitral regurgitation by prior echocardiogram    Osteoporosis, post-menopausal 03/14/2012   Ovarian tumor of borderline malignancy, right 2004   Palpitations    a. 02/2022 Zio: Predominantly sinus rhythm at 67 (52-171).  2 runs of NSVT -fastest 9 beats at 167.  35 SVT runs-longest 19 beats, fastest 171x6 beats.   Pancreatitis    secondary to Cholelithiasis   Parkinson disease (HCC) 02/10/2015   Post-thoracotomy pain syndrome    Pseudomonas pneumonia (HCC) 03/2008   Status post implantation of mitral valve leaflet clip 02/15/2022   s/p XTW MitraClip x2 by Dr. Excell Seltzer in the A2/P2: b. 04/03/23 s/p third mitraclip implantation.   Traumatic closed  fracture of distal clavicle with minimal displacement, left, initial encounter 06/2021   [facility]   Uterine fibroid    Vitamin D deficiency    Past Surgical History:  Procedure Laterality Date   ABDOMINAL HYSTERECTOMY     & BSO for Mucinous borderline tumor of R ovary 2004   APPENDECTOMY  2004   BREAST BIOPSY  08/2004   right breast-benign   BREAST LUMPECTOMY  1992   benign   BUBBLE STUDY  12/27/2021   Procedure: BUBBLE STUDY;  Surgeon: Sande Rives, MD;  Location: [facility] ENDOSCOPY;  Service:  Cardiovascular;;   CARDIAC CATHETERIZATION  2007   essentially negation for significant CAD   CATARACT EXTRACTION W/PHACO Left 07/24/2023   Procedure: CATARACT EXTRACTION PHACO AND INTRAOCULAR LENS PLACEMENT (IOC) LEFT 5.34 0037.3;  Surgeon: Lockie Mola, MD;  Location: [facility] SURGERY CNTR;  Service: Ophthalmology;  Laterality: Left;   CATARACT EXTRACTION W/PHACO Right 08/07/2023   Procedure: CATARACT EXTRACTION PHACO AND INTRAOCULAR LENS PLACEMENT (IOC) RIGHT 7.83 00:47.9;  Surgeon: Lockie Mola, MD;  Location: [facility] SURGERY CNTR;  Service: Ophthalmology;  Laterality: Right;   CHOLECYSTECTOMY  2001   COLONOSCOPY  2014   Dr Marina Goodell , due 2019   COLONOSCOPY  09/2018   2 TA removed, int hem, no f/u needed (Dr Marina Goodell)    ESOPHAGOGASTRODUODENOSCOPY  09/2018   GERD with esophagitis and stricture dilated, antral gastritis negative for H pylori Marina Goodell)   HIP ARTHROPLASTY Left 08/21/2022   Procedure: ARTHROPLASTY BIPOLAR HIP (HEMIARTHROPLASTY);  Surgeon: Juanell Fairly, MD;  Location: [facility] ORS;  Service: Orthopedics;  Laterality: Left;   LUNG BIOPSY  2002   [name], Dr Edwyna Shell   MASTECTOMY MODIFIED RADICAL Left 2002   oral chemotheraphy (tamoxifen then Armidex) no radiation, Dr.[name]   MITRAL VALVE REPAIR N/A 02/15/2022   Procedure: MITRAL VALVE REPAIR;  Surgeon: Tonny Bollman, MD;  Location: [facility] INVASIVE CV LAB;  Service: Cardiovascular;  Laterality: N/A;   MITRAL VALVE REPAIR N/A 04/03/2023   Procedure: MITRAL VALVE REPAIR;  Surgeon: Tonny Bollman, MD;  Location: [facility] INVASIVE CV LAB;  Service: Cardiovascular;  Laterality: N/A;   RIGHT/LEFT HEART CATH AND CORONARY ANGIOGRAPHY Bilateral 11/14/2021   Procedure: RIGHT/LEFT HEART CATH AND CORONARY ANGIOGRAPHY;  Surgeon: Iran Ouch, MD;  Location: [facility] INVASIVE CV LAB;  Service: Cardiovascular;  Laterality: Bilateral;   TEE WITHOUT CARDIOVERSION N/A 12/29/2018   Procedure: TRANSESOPHAGEAL ECHOCARDIOGRAM (TEE);  Surgeon: Iran Ouch, MD;  Location: [facility] ORS;  Service: Cardiovascular;  Laterality: N/A;   TEE WITHOUT CARDIOVERSION N/A 12/27/2021   Procedure: TRANSESOPHAGEAL ECHOCARDIOGRAM (TEE);  Surgeon: Sande Rives, MD;  Location: [facility] ENDOSCOPY;  Service: Cardiovascular;  Laterality: N/A;   TEE WITHOUT CARDIOVERSION N/A 02/15/2022   Procedure: TRANSESOPHAGEAL ECHOCARDIOGRAM (TEE);  Surgeon: Tonny Bollman, MD;  Location: [facility] INVASIVE CV LAB;  Service: Cardiovascular;  Laterality: N/A;   TEE WITHOUT CARDIOVERSION N/A 05/14/2022   Procedure: TRANSESOPHAGEAL ECHOCARDIOGRAM (TEE);  Surgeon: Sande Rives, MD;  Location: [facility] ENDOSCOPY;  Service: Cardiovascular;  Laterality: N/A;   TEE WITHOUT CARDIOVERSION N/A 04/03/2023   Procedure: TRANSESOPHAGEAL ECHOCARDIOGRAM;  Surgeon: Tonny Bollman, MD;  Location: [facility] INVASIVE CV LAB;  Service: Cardiovascular;  Laterality: N/A;   TONSILLECTOMY  1946   Social History:  reports that she has never smoked. She has never used smokeless tobacco. She reports that she does not drink alcohol and does not use drugs.  Allergies  Allergen Reactions   Sulfa Antibiotics Anaphylaxis   Sulfonamide Derivatives Anaphylaxis   Topamax [Topiramate] Other (See Comments)  Metabolic acidosis    Clarithromycin Other (See Comments)    pericarditis Other reaction(s): Not available   Hydrocodone Other (See Comments)    "hyper and climbing the walls"   Motrin [Ibuprofen] Other (See Comments)    headaches   Misc. Sulfonamide Containing Compounds     Other reaction(s): Not available   Symbicort [Budesonide-Formoterol Fumarate] Other (See Comments)    02/07/15 tremor   Tetracycline Hcl Other (See Comments)   Percocet [Oxycodone-Acetaminophen] Itching and Rash   Tape Itching and Rash    Use paper tape only   Tetracyclines & Related Other (See Comments)    "immediate yeast infection"    Family History  Problem Relation Age of Onset   Emphysema Mother        d.64 was never a  smoker   Lung cancer Father 21       d.82 history of smoking   Breast cancer Sister 46   Colon cancer Sister    Melanoma Maternal Aunt    Liver cancer Maternal Aunt    Cancer Maternal Uncle        unspecified type   Cancer Maternal Uncle        unspecified type   Cancer Maternal Uncle        unspecified type   Stroke Paternal Aunt        in mid 6s   Osteoporosis Paternal Aunt    Myasthenia gravis Paternal Aunt    Cancer Maternal Grandmother        d.82s unspecified GI cancer   Cancer Maternal Grandfather        d.62s unspecified type   Breast cancer Cousin        d.60s-daughter of unaffected paternal aunt Doristine Church   Colon cancer Cousin 60       d.70-daughter of unaffected maternal aunt Leila   Lung cancer Cousin 70       d.70-sisters to each other, both daughters of maternal uncle Johnny   Parkinson's disease Cousin    Diabetes Neg Hx    Heart disease Neg Hx    Stomach cancer Neg Hx    Ulcerative colitis Neg Hx     Prior to Admission medications   Medication Sig Start Date End Date Taking? Authorizing Provider  acetaminophen (TYLENOL) 500 MG tablet Take 1,000 mg by mouth 3 (three) times daily.    [provider]  albuterol (VENTOLIN HFA) 108 (90 Base) MCG/ACT inhaler Inhale 2 puffs into the lungs every 6 (six) hours as needed for wheezing or shortness of breath. 06/04/22   Eustaquio Boyden, MD  apixaban (ELIQUIS) 2.5 MG TABS tablet Take 1 tablet (2.5 mg total) by mouth 2 (two) times daily. 08/27/23   Iran Ouch, MD  calcium carbonate (TUMS - DOSED IN MG ELEMENTAL CALCIUM) 500 MG chewable tablet Chew 2 tablets by mouth daily as needed for indigestion or heartburn.    [provider]  Carbidopa-Levodopa ER (SINEMET CR) 25-100 MG tablet controlled release TAKE TWO TABLET BY MOUTH AT NINE IN THE MORNING, ONE TAB AT ONE IN THE EVENING AND ONE TAB AT FIVE IN THE EVENING 02/06/23   Tat, Octaviano Batty, DO  cephALEXin (KEFLEX) 500 MG capsule Take 1 capsule (500 mg  total) by mouth 2 (two) times daily. 09/02/23   Tower, Audrie Gallus, MD  Cholecalciferol (VITAMIN D3) 25 MCG (1000 UT) CAPS Take 1 capsule (1,000 Units total) by mouth daily. 09/27/22   Eustaquio Boyden, MD  clonazePAM (KLONOPIN) 0.5 MG tablet TAKE  ONE HALF (1/2) A TABLET BY MOUTH AT BEDTIME 05/09/23   Antony Madura, MD  EPINEPHrine 0.3 mg/0.3 mL IJ SOAJ injection Inject 0.3 mg into the muscle as needed for anaphylaxis. 02/25/20   [provider]  Fexofenadine HCl (ALLEGRA PO) Take by mouth daily at 8 pm.    [provider]  Fluticasone-Umeclidin-Vilant (TRELEGY ELLIPTA) 100-62.5-25 MCG/ACT AEPB Inhale 1 puff into the lungs daily. 04/27/22   Cobb, Ruby Cola, NP  furosemide (LASIX) 40 MG tablet TAKE ONE TABLET BY MOUTH ONCE DAILY *NEW DOSE* 03/29/23   Iran Ouch, MD  gabapentin ([name]) 300 MG capsule TAKE TWO CAPSULES BY MOUTH THREE TIMES DAILY 02/07/23   Eustaquio Boyden, MD  metoprolol tartrate (LOPRESSOR) 25 MG tablet Take 0.5 tablets (12.5 mg total) by mouth 2 (two) times daily. Patient not taking: Reported on 09/02/2023 08/29/23 11/27/23  Iran Ouch, MD  pantoprazole (PROTONIX) 20 MG tablet Take 1 tablet (20 mg total) by mouth daily. 04/01/23   Eustaquio Boyden, MD  potassium chloride SA (KLOR-CON M) 20 MEQ tablet TAKE ONE TABLET BY MOUTH ONCE A DAY 01/30/23   Iran Ouch, MD  Prednisolon-Moxiflox-Bromfenac 1-0.5-0.075 % SOLN Place 1 drop into the right eye 4 (four) times daily.    [provider]    Physical Exam: Vitals:   09/04/23 1145 09/04/23 1200 09/04/23 1230 09/04/23 1330  BP:  109/72 (!) 111/92 109/86  Pulse: (!) 107 91 (!) 102 (!) 109  Resp: (!) 32 (!) 26 (!) 29 (!) 27  Temp:      TempSrc:      SpO2: 98% 99% 100% 100%  Weight:      Height:       Physical Exam Constitutional:      Comments: Underweight, mild increased work of breathing  HENT:     Head: Normocephalic.     Nose: Nose normal.     Mouth/Throat:     Mouth: Mucous membranes  are moist.  Eyes:     Pupils: Pupils are equal, round, and reactive to light.  Cardiovascular:     Rate and Rhythm: Tachycardia present. Rhythm irregular.  Pulmonary:     Breath sounds: Wheezing present.     Comments: Mild increased work of breathing Abdominal:     General: Bowel sounds are normal.  Musculoskeletal:     Cervical back: Normal range of motion.     Comments: Positive generalized weakness  Skin:    General: Skin is warm.  Neurological:     General: No focal deficit present.     Comments: Positive generalized weakness and fatigue Otherwise grossly nonfocal neuroexam  Psychiatric:        Mood and Affect: Mood normal.     Data Reviewed:  There are no new results to review at this time.  DG Chest Port 1 View CLINICAL DATA:  Shortness of breath and weakness for several days.  EXAM: PORTABLE CHEST 1 VIEW  COMPARISON:  Chest radiographs 04/01/2023, 11/21/2022, 08/24/2022; CT chest 08/20/2023  FINDINGS: Cardiac silhouette is again markedly enlarged. Postsurgical change of apparent mitral valve surgery again seen. Mediastinal contours are unchanged and within normal limits. Surgical sutures are again seen within the right upper lung.  Chronic medial right upper lobe airspace opacity related to the bronchiectasis and atelectasis better seen on prior CT. Left basilar retrocardiac opacity is similar to prior. No definite pleural effusion. No pneumothorax. No acute skeletal abnormality.  IMPRESSION: 1. Chronic medial right upper lobe airspace opacity related to  the bronchiectasis and atelectasis better seen on prior CT. 2. Left basilar retrocardiac opacity is similar to prior and may represent atelectasis or pneumonia.  Electronically Signed   By: Neita Garnet M.D.   On: 09/04/2023 11:35  Lab Results  Component Value Date   WBC 6.2 09/04/2023   HGB 12.7 09/04/2023   HCT 40.5 09/04/2023   MCV 104.1 (H) 09/04/2023   PLT 165 09/04/2023   Last metabolic  panel Lab Results  Component Value Date   GLUCOSE 109 (H) 09/04/2023   NA 133 (L) 09/04/2023   K 4.9 09/04/2023   CL 103 09/04/2023   CO2 23 09/04/2023   BUN 24 (H) 09/04/2023   CREATININE 0.98 09/04/2023   GFRNONAA 58 (L) 09/04/2023   CALCIUM 9.7 09/04/2023   PHOS 3.6 12/07/2022   PROT 7.6 08/05/2023   ALBUMIN 3.7 08/05/2023   BILITOT 1.0 08/05/2023   ALKPHOS 98 08/05/2023   AST 13 08/05/2023   ALT 2 08/05/2023   ANIONGAP 7 09/04/2023    Assessment and Plan: * Atrial fibrillation with RVR (HCC) HR into the 140s  Afib w/ RVR on EKG  Started on dilt gtt in ER  Cont xarelto  Cardiology consulted  Follow up recommendations  Acute respiratory failure with hypoxia (HCC) Decompensated respiratory failure now requiring 2 L nasal cannula to keep O2 sats greater than 94% in the setting of A-fib with RVR,?  Pneumonia on imaging, decompensated heart failure as well as baseline COPD Positive mild increased work of breathing at baseline Positive wheezing Blood gas pending IV diltiazem for atrial fibrillation IV Solu-Medrol as well as as needed Xopenex for COPD ?  Left basilar retrocardiac infiltrate on chest x-ray IV Rocephin azithromycin for infectious coverage CT of the chest to better assess Continue supplemental oxygen as needed Follow-up     Acute on chronic heart failure with preserved ejection fraction (HFpEF) (HCC) Decompensated resp status w/ noted volume overload in setting of afib w/ RVR, COPD, PNA BNP 1700 (well above baseline)  CXR w/ noted acute vs. Chronic opacities  2+ pitting edema bilaterally  S/p IV lasix in the ER  Cardiology consulted Follow up recommendations    CAP (community acquired pneumonia) Decompensated resp status requiring 2L [place] with noted ? L retrocardiac PNA in setting of decompensated COPD, CHF and afib w/ RVR  Noted admission September 2024 for similar issue IV rocephin and azithromycin for infectious coverage  Blood and resp cultures   Consider further imaging as well as pulmonary/ID consultation if resp sxs fail to improve/worsen   Follow closely   Sepsis [facility]) Meeting sepsis criteria by heart rate in the 100s in setting A-fib with RVR as well as respirations into the 30s Noted concurrent A-fib with RVR, pneumonia, acute on chronic HFpEF, COPD exacerbations On IV Rocephin azithromycin for pulmonary coverage Pancultured Lactate and procalcitonin pending Deferring IV fluids in the setting of active HFpEF exacerbation Monitor   COPD (chronic obstructive pulmonary disease) (HCC) + decompensated COPD in setting of acute resp failure w/ hypoxia  + mild wheezing  IV solumedrol  Prn xopenex in setting of afib w/ RVR  Antibiotics per pneumonia coverage  Follow    Essential hypertension BP stable  Titrate home regimen    S/P mitral valve clip implantation S/p MitraClip's x 2 due to inadequate symptomatic improvement after first MitraClip in 2023   Parkinson disease (HCC) Cont sinemet   Daytime somnolence + chronic somnolence at baseline        Advance Care  Planning:   Code Status: Limited: Do not attempt resuscitation (DNR) -DNR-LIMITED -Do Not Intubate/DNI    Consults: Cardiology   Family Communication: No family at the bedside   Severity of Illness: The appropriate patient status for this patient is INPATIENT. Inpatient status is judged to be reasonable and necessary in order to provide the required intensity of service to ensure the patient's safety. The patient's presenting symptoms, physical exam findings, and initial radiographic and laboratory data in the context of their chronic comorbidities is felt to place them at high risk for further clinical deterioration. Furthermore, it is not anticipated that the patient will be medically stable for discharge from the hospital within 2 midnights of admission.   * I certify that at the point of admission it is my clinical judgment that the patient will  require inpatient hospital care spanning beyond 2 midnights from the point of admission due to high intensity of service, high risk for further deterioration and high frequency of surveillance required.*  Author: Floydene Flock, MD 09/04/2023 2:54 PM  For on call review [email or web address].

## 2023-09-04 NOTE — Assessment & Plan Note (Signed)
S/p MitraClip's x 2 due to inadequate symptomatic improvement after first MitraClip in 2023

## 2023-09-04 NOTE — Assessment & Plan Note (Signed)
+   chronic somnolence at baseline

## 2023-09-04 NOTE — ED Provider Notes (Signed)
[facility] Provider Note    Event Date/Time   First MD Initiated Contact with Patient 09/04/23 1039     (approximate)   History   Shortness of Breath and Weakness   HPI  Crystal Bass is a 82 y.o. female  with h/o mitral valve clips x 3, CHF, recently diagnosed AFib here with SOB. Pt reports that since waking up she has felt very SOB, with palpitations. She has had difficulty walking/moving 2/2 her SOB. She has no chest pain. She reports progressive functional decline and states it's been "one thing after another." Denies fevers. She has noticed increased LE edema over the past week or so.       Physical Exam   Triage Vital Signs: ED Triage Vitals  Encounter Vitals Group     BP 09/04/23 1024 (!) 121/93     Systolic BP Percentile --      Diastolic BP Percentile --      Pulse Rate 09/04/23 1029 (!) 140     Resp 09/04/23 1024 (!) 21     Temp 09/04/23 1024 97.8 F (36.6 C)     Temp Source 09/04/23 1024 Oral     SpO2 09/04/23 1024 97 %     Weight 09/04/23 1025 119 lb 0.8 oz (54 kg)     Height 09/04/23 1025 5\' 2"  (1.575 m)     Head Circumference --      Peak Flow --      Pain Score 09/04/23 1025 0     Pain Loc --      Pain Education --      Exclude from Growth Chart --     Most recent vital signs: Vitals:   09/04/23 1145 09/04/23 1200  BP:  109/72  Pulse: (!) 107 91  Resp: (!) 32 (!) 26  Temp:    SpO2: 98% 99%     General: Awake, no distress.  CV:  Good peripheral perfusion. Tachycardic, irregularly irregular. Resp:  Slight tachypnea, but lungs overall clear.  Abd:  No distention. No tenderness. Other:  2+ pitting edema bl LE   ED Results / Procedures / Treatments   Labs (all labs ordered are listed, but only abnormal results are displayed) Labs Reviewed  BASIC METABOLIC PANEL - Abnormal; Notable for the following components:      Result Value   Sodium 133 (*)    Glucose, Bld 109 (*)    BUN 24 (*)    GFR, Estimated 58  (*)    All other components within normal limits  CBC - Abnormal; Notable for the following components:   MCV 104.1 (*)    All other components within normal limits  PROTIME-INR - Abnormal; Notable for the following components:   Prothrombin Time 35.8 (*)    INR 3.6 (*)    All other components within normal limits  BRAIN NATRIURETIC PEPTIDE - Abnormal; Notable for the following components:   B Natriuretic Peptide 1,724.3 (*)    All other components within normal limits  MAGNESIUM  URINALYSIS, ROUTINE W REFLEX MICROSCOPIC     EKG AFib RVR, VR 138. QRS 92, QTc 454. No acute ST elevations. ST depressions worse in inferior and lateral leads, likely rate-related.   RADIOLOGY CXR: Chronic medial right upper lobe airspace opacity, left basilar retrocardiac opacity, atelectasis most likely   I also independently reviewed and agree with radiologist interpretations.   PROCEDURES:  Critical Care performed: Yes, see critical care procedure note(s)  .Critical Care  Performed by: Shaune Pollack, MD Authorized by: Shaune Pollack, MD   Critical care provider statement:    Critical care time (minutes):  30   Critical care time was exclusive of:  Separately billable procedures and treating other patients   Critical care was necessary to treat or prevent imminent or life-threatening deterioration of the following conditions:  Circulatory failure, cardiac failure and respiratory failure   Critical care was time spent personally by me on the following activities:  Development of treatment plan with patient or surrogate, discussions with consultants, evaluation of patient's response to treatment, examination of patient, ordering and review of laboratory studies, ordering and review of radiographic studies, ordering and performing treatments and interventions, pulse oximetry, re-evaluation of patient's condition and review of old charts     MEDICATIONS ORDERED IN ED: Medications  diltiazem  (CARDIZEM) 125 mg in dextrose 5% 125 mL (1 mg/mL) infusion (5 mg/hr Intravenous New Bag/Given 09/04/23 1113)  furosemide (LASIX) injection 20 mg (has no administration in time range)  diltiazem (CARDIZEM) injection 10 mg (10 mg Intravenous Given 09/04/23 1111)     IMPRESSION / MDM / ASSESSMENT AND PLAN / ED COURSE  I reviewed the triage vital signs and the nursing notes.                              Differential diagnosis includes, but is not limited to, AFib RVR, CHF, electrolyte abnormality, ACS, PNA, anemia  Patient's presentation is most consistent with acute presentation with potential threat to life or bodily function.  The patient is on the cardiac monitor to evaluate for evidence of arrhythmia and/or significant heart rate changes  82 year old female with past medical history as above here with A-fib RVR.  Patient appears hypervolemic and I suspect she likely has a component of CHF as well.  Patient placed on diltiazem drip with improvement in rate.  CBC shows no leukocytosis or anemia.  Lab work shows significant BNP elevation to 1700.  I suspect she will need cautious diuresis.  She is on Eliquis.  Case discussed with Dr. And he was in agreement with rate control for now given that she has only been anticoagulated for the last week.  Will admit to medicine.  No leukocytosis, no fever, and she was recently treated for pneumonia.  Do not feel chest x-ray findings represent infectious etiology.    FINAL CLINICAL IMPRESSION(S) / ED DIAGNOSES   Final diagnoses:  Atrial fibrillation with rapid ventricular response (HCC)  Acute on chronic congestive heart failure, unspecified heart failure type (HCC)     Rx / DC Orders   ED Discharge Orders     None        Note:  This document was prepared using Dragon voice recognition software and may include unintentional dictation errors.   Shaune Pollack, MD 09/04/23 1225

## 2023-09-04 NOTE — ED Triage Notes (Signed)
Pt from [facility] via [facility], reports that pt has been experiencing sob and weakness for several days and it has been worsening. Pt reports recent diagnosis of afib and was placed on eliquis. Pt A/O x 4.

## 2023-09-04 NOTE — Telephone Encounter (Signed)
Pt c/o Shortness Of Breath: STAT if SOB developed within the last 24 hours or pt is noticeably SOB on the phone  1. Are you currently SOB (can you hear that pt is SOB on the phone)? Yes  2. How long have you been experiencing SOB? Years  3. Are you SOB when sitting or when up moving around? Moving around and sitting   4. Are you currently experiencing any other symptoms? Fluid retention and pain in lower abdomen and back

## 2023-09-04 NOTE — Assessment & Plan Note (Signed)
Decompensated resp status w/ noted volume overload in setting of afib w/ RVR, COPD, PNA BNP 1700 (well above baseline)  CXR w/ noted acute vs. Chronic opacities  2+ pitting edema bilaterally  S/p IV lasix in the ER  Cardiology consulted Follow up recommendations

## 2023-09-04 NOTE — Assessment & Plan Note (Signed)
Meeting sepsis criteria by heart rate in the 100s in setting A-fib with RVR as well as respirations into the 30s Noted concurrent A-fib with RVR, pneumonia, acute on chronic HFpEF, COPD exacerbations On IV Rocephin azithromycin for pulmonary coverage Pancultured Lactate and procalcitonin pending Deferring IV fluids in the setting of active HFpEF exacerbation Monitor

## 2023-09-04 NOTE — Consult Note (Signed)
Cardiology Consultation   Patient ID: Crystal Bass MRN: [id number]; DOB: September 02, 1941  Admit date: 09/04/2023 Date of Consult: 09/04/2023  PCP:  Crystal Boyden, MD    [facility] Providers Cardiologist:  Crystal Bears, MD   {   Patient Profile:   Crystal Bass is a 82 y.o. female with a hx of mitral valve prolapse and mitral regurgitation s/p mitral clip x 3, orthostatic hypotension, Parkinson's disease who is being seen 09/04/2023 for the evaluation of afib RVR at the request of Crystal Bass.  History of Present Illness:   Crystal Bass was diagnosed with Parkison's in 2015. She used to be on blood pressure medications, but these were stopped due to orthostatic hypotension. She underwent mitral valve clip procedure in March of 2023 with mitral clip x 2. She had minimal improvement of symptoms . Repeat echo showed severe MR confirmed by TEE. She underwent a third mitral valve clip placement in May 2024. Postprocedure echo showed mild to moderate MR.   She saw Crystal Bass on 10/1 and found to be in rapid afib. She was very symptomatic and inpatient treatment was discussed, but patent wanted to be managed as outpatient. She was started on metoprolol 25mg  BID and Eliquis 2.5mg  BID.   The patient presented to the ER with SOB, palpitations and chest pain. Difficult to describe chest pain, may be related to the palpitations/fast heart rate. She had worsening SOB and progressive LLE. She reported an 8lb weight gain.(Baseline weight 115lbs). She reports only taking 3 doses of metoprolol due to hypotension.  In the ER found to be in rapid afib with heart rates into the 140s started on IV dilt. Started on 1-2L O2. CXR showed left opacity atelectasis vs PNA. Labs show Na 133, K 4.9, wbc 6.2, Hgb 12.7, Mag 2.1, BNP 1724.patient was given IV lasix and admitted.    Past Medical History:  Diagnosis Date   Aneurysm of aorta (HCC)    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   Anginal pain  (HCC)    a. 10/2021 Cath: Nl cors.   Arthritis    Asthma    Breast cancer (HCC) 2002   infiltrative ductal carcinoma    Cataract 2019   Chronic heart failure with preserved ejection fraction (HFpEF) (HCC)    COPD (chronic obstructive pulmonary disease) (HCC) 07/2007   Coronary artery disease    Ductal carcinoma of breast, estrogen receptor positive, stage 1 (HCC) 09/16/2012   Endometriosis    GERD (gastroesophageal reflux disease)    Hx of mitral valve repair    Hypercalcemia 09/14/2013   Hypertension    Lesion of breast 1992   right, benign   Lung disease    secondary to [name] infection   Mastalgia 05/1994   Mitral regurgitation    a. 10/2021 RHC: PCWP V wave of 34; b. 01/2022 TEE: Severe flail/prolapse of large area of A2 scallop with severe posteriorly directed MR; c. 01/2022 TEER w /Mitraclip XTW x 2; d. 07/2022 Echo: EF 60-65%. No rwma, mild LVH, GrIII DD. Sev dil LA. Mod elev PASP. Mod-Sev MR. Mod MS.   Moderate mitral regurgitation by prior echocardiogram    Osteoporosis, post-menopausal 03/14/2012   Ovarian tumor of borderline malignancy, right 2004   Palpitations    a. 02/2022 Zio: Predominantly sinus rhythm at 67 (52-171).  2 runs of NSVT -fastest 9 beats at 167.  35 SVT runs-longest 19 beats, fastest 171x6 beats.   Pancreatitis    secondary to Cholelithiasis  Parkinson disease (HCC) 02/10/2015   Post-thoracotomy pain syndrome    Pseudomonas pneumonia (HCC) 03/2008   Status post implantation of mitral valve leaflet clip 02/15/2022   s/p XTW MitraClip x2 by Crystal Bass in the A2/P2: b. 04/03/23 s/p third mitraclip implantation.   Traumatic closed fracture of distal clavicle with minimal displacement, left, initial encounter 06/2021   [facility]   Uterine fibroid    Vitamin D deficiency     Past Surgical History:  Procedure Laterality Date   ABDOMINAL HYSTERECTOMY     & BSO for Mucinous borderline tumor of R ovary 2004   APPENDECTOMY  2004   BREAST BIOPSY  08/2004    right breast-benign   BREAST LUMPECTOMY  1992   benign   BUBBLE STUDY  12/27/2021   Procedure: BUBBLE STUDY;  Surgeon: Crystal Rives, MD;  Location: [facility] ENDOSCOPY;  Service: Cardiovascular;;   CARDIAC CATHETERIZATION  2007   essentially negation for significant CAD   CATARACT EXTRACTION W/PHACO Left 07/24/2023   Procedure: CATARACT EXTRACTION PHACO AND INTRAOCULAR LENS PLACEMENT (IOC) LEFT 5.34 0037.3;  Surgeon: Crystal Mola, MD;  Location: [facility] SURGERY CNTR;  Service: Ophthalmology;  Laterality: Left;   CATARACT EXTRACTION W/PHACO Right 08/07/2023   Procedure: CATARACT EXTRACTION PHACO AND INTRAOCULAR LENS PLACEMENT (IOC) RIGHT 7.83 00:47.9;  Surgeon: Crystal Mola, MD;  Location: [facility] SURGERY CNTR;  Service: Ophthalmology;  Laterality: Right;   CHOLECYSTECTOMY  2001   COLONOSCOPY  2014   Crystal Crystal Bass , due 2019   COLONOSCOPY  09/2018   2 TA removed, int hem, no f/u needed (Crystal Crystal Bass)    ESOPHAGOGASTRODUODENOSCOPY  09/2018   GERD with esophagitis and stricture dilated, antral gastritis negative for H pylori Crystal Bass)   HIP ARTHROPLASTY Left 08/21/2022   Procedure: ARTHROPLASTY BIPOLAR HIP (HEMIARTHROPLASTY);  Surgeon: Crystal Fairly, MD;  Location: [facility] ORS;  Service: Orthopedics;  Laterality: Left;   LUNG BIOPSY  2002   [name], Crystal Bass   MASTECTOMY MODIFIED RADICAL Left 2002   oral chemotheraphy (tamoxifen then Armidex) no radiation, Crystal Bass   MITRAL VALVE REPAIR N/A 02/15/2022   Procedure: MITRAL VALVE REPAIR;  Surgeon: Crystal Bollman, MD;  Location: [facility] INVASIVE CV LAB;  Service: Cardiovascular;  Laterality: N/A;   MITRAL VALVE REPAIR N/A 04/03/2023   Procedure: MITRAL VALVE REPAIR;  Surgeon: Crystal Bollman, MD;  Location: [facility] INVASIVE CV LAB;  Service: Cardiovascular;  Laterality: N/A;   RIGHT/LEFT HEART CATH AND CORONARY ANGIOGRAPHY Bilateral 11/14/2021   Procedure: RIGHT/LEFT HEART CATH AND CORONARY ANGIOGRAPHY;  Surgeon: Crystal Ouch, MD;  Location: [facility]  INVASIVE CV LAB;  Service: Cardiovascular;  Laterality: Bilateral;   TEE WITHOUT CARDIOVERSION N/A 12/29/2018   Procedure: TRANSESOPHAGEAL ECHOCARDIOGRAM (TEE);  Surgeon: Crystal Ouch, MD;  Location: [facility] ORS;  Service: Cardiovascular;  Laterality: N/A;   TEE WITHOUT CARDIOVERSION N/A 12/27/2021   Procedure: TRANSESOPHAGEAL ECHOCARDIOGRAM (TEE);  Surgeon: Crystal Rives, MD;  Location: [facility] ENDOSCOPY;  Service: Cardiovascular;  Laterality: N/A;   TEE WITHOUT CARDIOVERSION N/A 02/15/2022   Procedure: TRANSESOPHAGEAL ECHOCARDIOGRAM (TEE);  Surgeon: Crystal Bollman, MD;  Location: [facility] INVASIVE CV LAB;  Service: Cardiovascular;  Laterality: N/A;   TEE WITHOUT CARDIOVERSION N/A 05/14/2022   Procedure: TRANSESOPHAGEAL ECHOCARDIOGRAM (TEE);  Surgeon: Crystal Rives, MD;  Location: [facility] ENDOSCOPY;  Service: Cardiovascular;  Laterality: N/A;   TEE WITHOUT CARDIOVERSION N/A 04/03/2023   Procedure: TRANSESOPHAGEAL ECHOCARDIOGRAM;  Surgeon: Crystal Bollman, MD;  Location: [facility] INVASIVE CV LAB;  Service: Cardiovascular;  Laterality: N/A;   TONSILLECTOMY  1946  Home Medications:  Prior to Admission medications   Medication Sig Start Date End Date Taking? Authorizing Provider  acetaminophen (TYLENOL) 500 MG tablet Take 1,000 mg by mouth 3 (three) times daily.    [provider]  albuterol (VENTOLIN HFA) 108 (90 Base) MCG/ACT inhaler Inhale 2 puffs into the lungs every 6 (six) hours as needed for wheezing or shortness of breath. 06/04/22   Crystal Boyden, MD  apixaban (ELIQUIS) 2.5 MG TABS tablet Take 1 tablet (2.5 mg total) by mouth 2 (two) times daily. 08/27/23   Crystal Ouch, MD  calcium carbonate (TUMS - DOSED IN MG ELEMENTAL CALCIUM) 500 MG chewable tablet Chew 2 tablets by mouth daily as needed for indigestion or heartburn.    [provider]  Carbidopa-Levodopa ER (SINEMET CR) 25-100 MG tablet controlled release TAKE TWO TABLET BY MOUTH AT NINE IN THE MORNING, ONE TAB AT  ONE IN THE EVENING AND ONE TAB AT FIVE IN THE EVENING 02/06/23   Tat, Octaviano Batty, DO  cephALEXin (KEFLEX) 500 MG capsule Take 1 capsule (500 mg total) by mouth 2 (two) times daily. 09/02/23   Tower, Audrie Gallus, MD  Cholecalciferol (VITAMIN D3) 25 MCG (1000 UT) CAPS Take 1 capsule (1,000 Units total) by mouth daily. 09/27/22   Crystal Boyden, MD  clonazePAM (KLONOPIN) 0.5 MG tablet TAKE ONE HALF (1/2) A TABLET BY MOUTH AT BEDTIME 05/09/23   Antony Madura, MD  EPINEPHrine 0.3 mg/0.3 mL IJ SOAJ injection Inject 0.3 mg into the muscle as needed for anaphylaxis. 02/25/20   [provider]  Fexofenadine HCl (ALLEGRA PO) Take by mouth daily at 8 pm.    [provider]  Fluticasone-Umeclidin-Vilant (TRELEGY ELLIPTA) 100-62.5-25 MCG/ACT AEPB Inhale 1 puff into the lungs daily. 04/27/22   Cobb, Ruby Cola, NP  furosemide (LASIX) 40 MG tablet TAKE ONE TABLET BY MOUTH ONCE DAILY *NEW DOSE* 03/29/23   Crystal Ouch, MD  gabapentin ([name]) 300 MG capsule TAKE TWO CAPSULES BY MOUTH THREE TIMES DAILY 02/07/23   Crystal Boyden, MD  metoprolol tartrate (LOPRESSOR) 25 MG tablet Take 0.5 tablets (12.5 mg total) by mouth 2 (two) times daily. Patient not taking: Reported on 09/02/2023 08/29/23 11/27/23  Crystal Ouch, MD  pantoprazole (PROTONIX) 20 MG tablet Take 1 tablet (20 mg total) by mouth daily. 04/01/23   Crystal Boyden, MD  potassium chloride SA (KLOR-CON M) 20 MEQ tablet TAKE ONE TABLET BY MOUTH ONCE A DAY 01/30/23   Crystal Ouch, MD  Prednisolon-Moxiflox-Bromfenac 1-0.5-0.075 % SOLN Place 1 drop into the right eye 4 (four) times daily.    [provider]    Inpatient Medications: Scheduled Meds:  Continuous Infusions:  diltiazem (CARDIZEM) infusion 5 mg/hr (09/04/23 1113)   PRN Meds:   Allergies:    Allergies  Allergen Reactions   Sulfa Antibiotics Anaphylaxis   Sulfonamide Derivatives Anaphylaxis   Topamax [Topiramate] Other (See Comments)    Metabolic acidosis     Clarithromycin Other (See Comments)    pericarditis Other reaction(s): Not available   Hydrocodone Other (See Comments)    "hyper and climbing the walls"   Motrin [Ibuprofen] Other (See Comments)    headaches   Misc. Sulfonamide Containing Compounds     Other reaction(s): Not available   Symbicort [Budesonide-Formoterol Fumarate] Other (See Comments)    02/07/15 tremor   Tetracycline Hcl Other (See Comments)   Percocet [Oxycodone-Acetaminophen] Itching and Rash   Tape Itching and Rash    Use paper tape only  Tetracyclines & Related Other (See Comments)    "immediate yeast infection"    Social History:   Social History   Socioeconomic History   Marital status: Widowed    Spouse name: Not on file   Number of children: 0   Years of education: Not on file   Highest education level: Not on file  Occupational History   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, 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, 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, 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  Tobacco Use   Smoking status: Never   Smokeless tobacco: Never  Vaping Use   Vaping status: Never Used  Substance and Sexual Activity   Alcohol use: No    Alcohol/week: 0.0 standard drinks of alcohol   Drug use: No   Sexual activity: Not Currently    Birth control/protection: Surgical    Comment: TAH/BSO  Other Topics Concern   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    Aunt of Crystal Gilmore Laroche    Lives independent living at [facility]    No children    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, 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, 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, 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)    Activity: walking about 1 mile/day, enjoys [employer]    Diet: some water, fruits/vegetables some    Right handed    Social Determinants of Health   Financial Resource Strain: Low Risk  (08/13/2022)   Overall Financial Resource Strain (CARDIA)    Difficulty of Paying Living Expenses: Not hard at all  Food Insecurity: No Food Insecurity (08/12/2023)   Hunger Vital Sign    Worried About Running Out of Food in the Last Year: Never true    Ran Out of  Food in the Last Year: Never true  Transportation Needs: No Transportation Needs (08/12/2023)   PRAPARE - Administrator, Civil Service (Medical): No    Lack of Transportation (Non-Medical): No  Physical Activity: Insufficiently Active (08/13/2022)   Exercise Vital Sign    Days of Exercise per Week: 4 days    Minutes of Exercise per Session: 30 min  Stress: No Stress Concern Present (08/13/2022)   [employer] of Occupational Health - Occupational Stress Questionnaire    Feeling of Stress : Not at all  Social Connections: Socially Isolated (08/13/2022)   Social Connection and Isolation Panel [NHANES]    Frequency of Communication with Friends and Family: More than three times a week    Frequency of Social Gatherings with Friends and Family: Once a week    Attends Religious Services: Never    Database 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 or Organizations: No    Attends Banker Meetings: Never    Marital Status: Widowed  Intimate Partner Violence: Not At Risk (08/09/2023)   Humiliation, Afraid, Rape, and Kick questionnaire    Fear of Current or Ex-Partner: No    Emotionally Abused: No    Physically Abused: No    Sexually Abused: No    Family History:    Family History  Problem Relation Age of Onset   Emphysema Mother        d.64 was never a smoker   Lung cancer Father 72       d.82 history of smoking   Breast cancer Sister 63   Colon cancer Sister    Melanoma Maternal Aunt    Liver cancer Maternal Aunt    Cancer Maternal Uncle        unspecified type   Cancer Maternal Uncle        unspecified type   Cancer Maternal Uncle  unspecified type   Stroke Paternal Aunt        in mid 67s   Osteoporosis Paternal Aunt    Myasthenia gravis Paternal Aunt    Cancer Maternal Grandmother        d.82s unspecified GI cancer   Cancer Maternal Grandfather        d.62s unspecified type   Breast cancer Cousin        d.60s-daughter of unaffected paternal aunt Doristine Church   Colon cancer  Cousin 60       d.70-daughter of unaffected maternal aunt Leila   Lung cancer Cousin 70       d.70-sisters to each other, both daughters of maternal uncle Johnny   Parkinson's disease Cousin    Diabetes Neg Hx    Heart disease Neg Hx    Stomach cancer Neg Hx    Ulcerative colitis Neg Hx      ROS:  Please see the history of present illness.   All other ROS reviewed and negative.     Physical Exam/Data:   Vitals:   09/04/23 1130 09/04/23 1145 09/04/23 1200 09/04/23 1230  BP: (!) 113/96  109/72 (!) 111/92  Pulse: (!) 46 (!) 107 91 (!) 102  Resp: (!) 28 (!) 32 (!) 26 (!) 29  Temp:      TempSrc:      SpO2: 100% 98% 99% 100%  Weight:      Height:       No intake or output data in the 24 hours ending 09/04/23 1307    09/04/2023   10:25 AM 09/02/2023   12:18 PM 08/27/2023    3:11 PM  Last 3 Weights  Weight (lbs) 119 lb 0.8 oz 119 lb 6 oz 120 lb 2 oz  Weight (kg) 54 kg 54.148 kg 54.488 kg     Body mass index is 21.77 kg/m.  General:  frail elderly female visibly SOB HEENT: normal Neck: no JVD Vascular: No carotid bruits; Distal pulses 2+ bilaterally Cardiac:  normal S1, S2; Irreg, Irreg, tachy; +murmur  Lungs:  clear to auscultation bilaterally, no wheezing, rhonchi or rales  Abd: soft, nontender, no hepatomegaly  Ext: 1+ lower leg edema Musculoskeletal:  No deformities, BUE and BLE strength normal and equal Skin: warm and dry  Neuro:  CNs 2-12 intact, no focal abnormalities noted Psych:  Normal affect   EKG:  The EKG was personally reviewed and demonstrates:  Afib 138bpm Telemetry:  Telemetry was personally reviewed and demonstrates:  Aifb HR 140s>>>100s  Relevant CV Studies:  Echo 04/2023 1. Left ventricular ejection fraction, by estimation, is 60 to 65%. The  left ventricle has normal function. The left ventricle has no regional  wall motion abnormalities. There is mild left ventricular hypertrophy.  Left ventricular diastolic parameters  are indeterminate.   2.  Right ventricular systolic function is normal. The right ventricular  size is mildly enlarged. There is mildly elevated pulmonary artery  systolic pressure. The estimated right ventricular systolic pressure is  38.5 mmHg.   3. The mitral valve has been repaired/replaced. There is a Mitra-Clip  present in the mitral position. The average mean gradient is at HR  56bpm. There is moderate mitral regurgitation that appears slightly more  significant compared to prior TTE  best appreciated on SAX views.   4. Left atrial size was severely dilated.   5. An iatrogenic ASD is present with left to right shunting. Evidence of  atrial level shunting detected by color flow Doppler.  6. Right atrial size was moderately dilated.   7. The aortic valve is tricuspid. There is mild calcification of the  aortic valve. There is mild thickening of the aortic valve. Aortic valve  regurgitation is not visualized. Aortic valve sclerosis/calcification is  present, without any evidence of  aortic stenosis.   8. Aortic dilatation noted. There is mild dilatation of the ascending  aorta, measuring 41 mm.   9. The inferior vena cava is normal in size with greater than 50%  respiratory variability, suggesting right atrial pressure of 3 mmHg.   Echo 03/2023 1. S/p additonal Mitra-Clip procedure. Mild to moderate mitral  regurgitation. There is systolic blunting of the right sided pulmonary  vein flow. Triple orifice area 2D planimetry of 3.03 cm2. There are two  regurgintant jets; a medial jet and a central  jet between the three Mitra-Clips. There are two iatrogenic ASDs with left  to right flow. The mitral valve has been repaired/replaced. Mild to  moderate mitral valve regurgitation. The mean mitral valve gradient is 4.0  mmHg with average heart rate of 58  bpm. There is a Mitra-Clip present in the mitral position. Procedure Date:  04/03/23.   2. Left ventricular ejection fraction, by estimation, is 60 to  65%. The  left ventricle has normal function. The left ventricle has no regional  wall motion abnormalities. There is mild concentric left ventricular  hypertrophy. Left ventricular diastolic  parameters are indeterminate.   3. Right ventricular systolic function is normal. The right ventricular  size is moderately enlarged. There is moderately elevated pulmonary artery  systolic pressure.   4. Left atrial size was severely dilated.   5. Evidence of atrial level shunting detected by color flow Doppler.   6. Right atrial size was mild to moderately dilated.   7. The aortic valve is tricuspid. Aortic valve regurgitation is trivial.  No aortic stenosis is present.   8. The inferior vena cava is dilated in size with >50% respiratory  variability, suggesting right atrial pressure of 8 mmHg.   Mitral valve repair 04/03/23 Successful TEER of the mitral valve with a MitraClip G4 XTW device, positioned A2/P2, lateral to the indwelling Clip devices   MR reduced from 4+ to 2+ post-procedure  Echo 01/2023 1. Left ventricular ejection fraction, by estimation, is 60 to 65%. The  left ventricle has normal function. The left ventricle has no regional  wall motion abnormalities. There is moderate left ventricular hypertrophy.  Left ventricular diastolic  parameters are indeterminate.   2. Right ventricular systolic function is normal. The right ventricular  size is normal. There is mildly elevated pulmonary artery systolic  pressure. The estimated right ventricular systolic pressure is 45.0 mmHg.   3. Left atrial size was severely dilated.   4. S/p Mitraclip with 2 XTW clips in the A2-P2 position. Severe eccentric  mitral valve regurgitation, originating lateral to the clips. 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 The mitral valve has  been repaired/replaced. Severe  mitral valve regurgitation. The mean mitral valve gradient is 5.4 mmHg  with average heart rate of 62 bpm.  There is a Mitra-Clip present in the  mitral position. Procedure Date: 02/15/2022.   5. Tricuspid valve regurgitation is mild to moderate.   6. The aortic valve is grossly normal. Aortic valve regurgitation is  trivial. No aortic stenosis is present.   7. Iatrogenic atrial septal defect s/p transeptal puncture.. Evidence of  atrial level shunting detected by color flow Doppler. There is  a atrial  septal defect with predominantly left to right shunting across the atrial  septum.   8. The inferior vena cava is normal in size with greater than 50%  respiratory variability, suggesting right atrial pressure of 3 mmHg.   Mitral valve repair 01/2022 Successful TEER with placement of 2 Mitraclip XTW devices, reducing non-rheumatic MR from 4+ to 1+, with both clips positioned A2/P2. Mean transmitral gradient 4 mmhg at case completion.    See full op note dictated separately    Laboratory Data:  High Sensitivity Troponin:  No results for input(s): "TROPONINIHS" in the last 720 hours.   Chemistry Recent Labs  Lab 09/04/23 1033 09/04/23 1057  NA 133*  --   K 4.9  --   CL 103  --   CO2 23  --   GLUCOSE 109*  --   BUN 24*  --   CREATININE 0.98  --   CALCIUM 9.7  --   MG  --  2.1  GFRNONAA 58*  --   ANIONGAP 7  --     No results for input(s): "PROT", "ALBUMIN", "AST", "ALT", "ALKPHOS", "BILITOT" in the last 168 hours. Lipids No results for input(s): "CHOL", "TRIG", "HDL", "LABVLDL", "LDLCALC", "CHOLHDL" in the last 168 hours.  Hematology Recent Labs  Lab 09/04/23 1033  WBC 6.2  RBC 3.89  HGB 12.7  HCT 40.5  MCV 104.1*  MCH 32.6  MCHC 31.4  RDW 15.3  PLT 165   Thyroid No results for input(s): "TSH", "FREET4" in the last 168 hours.  BNP Recent Labs  Lab 09/04/23 1057  BNP 1,724.3*    DDimer No results for input(s): "DDIMER" in the last 168 hours.   Radiology/Studies:  DG Chest Port 1 View  Result Date: 09/04/2023 CLINICAL DATA:  Shortness of breath and weakness for  several days. EXAM: PORTABLE CHEST 1 VIEW COMPARISON:  Chest radiographs 04/01/2023, 11/21/2022, 08/24/2022; CT chest 08/20/2023 FINDINGS: Cardiac silhouette is again markedly enlarged. Postsurgical change of apparent mitral valve surgery again seen. Mediastinal contours are unchanged and within normal limits. Surgical sutures are again seen within the right upper lung. Chronic medial right upper lobe airspace opacity related to the bronchiectasis and atelectasis better seen on prior CT. Left basilar retrocardiac opacity is similar to prior. No definite pleural effusion. No pneumothorax. No acute skeletal abnormality. IMPRESSION: 1. Chronic medial right upper lobe airspace opacity related to the bronchiectasis and atelectasis better seen on prior CT. 2. Left basilar retrocardiac opacity is similar to prior and may represent atelectasis or pneumonia. Electronically Signed   By: Neita Garnet M.D.   On: 09/04/2023 11:35     Assessment and Plan:   Rapid afib - recently diagnosed with afib RVR on 10/1 in the office started on eliquis and metoprolol. She was asymptomatic but wanted to be managed as outpatient - she took metoprolol for three doses - has been taking eliquis, denies missing doses - started on IV dilt with improvement of heart rates, monitor BP - check echo - plan for TEE/DCCV tomorrow - If BP is too soft, may need to switch to amiodarone  Acute on chronic HFpEF - reports 8lbs weight gain - BNP 1724 - echo 04/2023 showed LVEF 60-65%, mild LVH, moderate MR - PTA lasix 40mg  daily - start IV lasix 40mg  BID - acute heart failure in the setting of rapid afib wth underlying mitral valve disease - re-check limited echo  S/p mitral valve clip x 3 - most recent mitral clip in May  2024 - re-check echo as above  Chest pain  - in the setting rapid afib and volume overload - R/L heart cath in 2022 showed normal coronaries  For questions or updates, please contact [facility] Please consult [email or web address] for contact info under    Signed, Jazzalynn Rhudy David Stall, PA-C  09/04/2023 1:07 PM

## 2023-09-04 NOTE — ED Notes (Signed)
Patient ambulated to the bedside commode per request.

## 2023-09-04 NOTE — Assessment & Plan Note (Signed)
Decompensated respiratory failure now requiring 2 L nasal cannula to keep O2 sats greater than 94% in the setting of A-fib with RVR,?  Pneumonia on imaging, decompensated heart failure as well as baseline COPD Positive mild increased work of breathing at baseline Positive wheezing Blood gas pending IV diltiazem for atrial fibrillation IV Solu-Medrol as well as as needed Xopenex for COPD ?  Left basilar retrocardiac infiltrate on chest x-ray IV Rocephin azithromycin for infectious coverage CT of the chest to better assess Continue supplemental oxygen as needed Follow-up

## 2023-09-04 NOTE — ED Notes (Signed)
Dr. Erma Heritage stated to hold the lasix at this time and Dr. Alvester Morin will examine the pt

## 2023-09-04 NOTE — ED Notes (Signed)
Dr. Alvester Morin made aware of pt's BP and the cardizem being shut off--per Dr, will monitor and reassess.

## 2023-09-04 NOTE — Telephone Encounter (Signed)
Caller [facility]) called to report they will be sending the patient to the ED.

## 2023-09-04 NOTE — Assessment & Plan Note (Signed)
Cont sinemet ?

## 2023-09-04 NOTE — Telephone Encounter (Signed)
Stat phone call from pt c/o SOB (with notable exertion during speech), pt reports weight gain of 4-6 pounds in the last 3 days, and RLQ pain (not revealed until EMT involvement at the last call to pt), pt unable to report pulse Ox but reports hx of "it's usually good.. 93-95%"  Pt reports lasix use but can't quantify urine output, pt also reports ineffective inhaler use due to "unable to take a deep breath"  Pt counseled to go to ED for diuresis and assisted breathing, pt declined and reports she wants Grandville Silos (a Interior and spatial designer at [facility]) consulted prior to ED visit, so I called [facility] Arista and left a message then called Marvel Plan at the assisted living desk who then dispatched the on duty EMT to pt's apartment.   Upon call back to pt spoke to EMT in room and they concurred to reccommended ambulance to ED for pt care.

## 2023-09-04 NOTE — Assessment & Plan Note (Addendum)
Decompensated resp status requiring 2L [place] with noted ? L retrocardiac PNA in setting of decompensated COPD, CHF and afib w/ RVR  Noted admission September 2024 for similar issue IV rocephin and azithromycin for infectious coverage  Blood and resp cultures  Consider further imaging as well as pulmonary/ID consultation if resp sxs fail to improve/worsen   Follow closely

## 2023-09-04 NOTE — Assessment & Plan Note (Signed)
BP stable Titrate home regimen 

## 2023-09-04 NOTE — Assessment & Plan Note (Signed)
+   decompensated COPD in setting of acute resp failure w/ hypoxia  + mild wheezing  IV solumedrol  Prn xopenex in setting of afib w/ RVR  Antibiotics per pneumonia coverage  Follow

## 2023-09-05 ENCOUNTER — Inpatient Hospital Stay (HOSPITAL_COMMUNITY)
Admit: 2023-09-05 | Discharge: 2023-09-05 | Disposition: A | Discharge status: Home / Self Care | Payer: Medicare PPO | Attending: Medical | Admitting: Medical

## 2023-09-05 ENCOUNTER — Other Ambulatory Visit: Payer: Self-pay

## 2023-09-05 ENCOUNTER — Inpatient Hospital Stay
Admit: 2023-09-05 | Discharge: 2023-09-05 | Disposition: A | Discharge status: Home / Self Care | Payer: Medicare PPO | Attending: Medical | Admitting: Medical

## 2023-09-05 ENCOUNTER — Inpatient Hospital Stay: Payer: Medicare PPO | Admitting: Certified Registered Nurse Anesthetist

## 2023-09-05 ENCOUNTER — Encounter
Admission: EM | Disposition: A | Discharge status: Skilled Nursing Facility | Payer: Self-pay | Source: Home / Self Care | Attending: Internal Medicine

## 2023-09-05 DIAGNOSIS — I351 Nonrheumatic aortic (valve) insufficiency: Secondary | ICD-10-CM

## 2023-09-05 DIAGNOSIS — I4891 Unspecified atrial fibrillation: Principal | ICD-10-CM

## 2023-09-05 DIAGNOSIS — I340 Nonrheumatic mitral (valve) insufficiency: Secondary | ICD-10-CM

## 2023-09-05 DIAGNOSIS — I5033 Acute on chronic diastolic (congestive) heart failure: Secondary | ICD-10-CM | POA: Diagnosis not present

## 2023-09-05 HISTORY — PX: TEE WITHOUT CARDIOVERSION: SHX5443

## 2023-09-05 HISTORY — PX: CARDIOVERSION: SHX1299

## 2023-09-05 LAB — ECHOCARDIOGRAM LIMITED
Height: 62 in
S' Lateral: 3.1 cm
Weight: 1904.77 [oz_av]

## 2023-09-05 LAB — COMPREHENSIVE METABOLIC PANEL
ALT: 14 U/L (ref 0–44)
AST: 21 U/L (ref 15–41)
Albumin: 3.4 g/dL — ABNORMAL LOW (ref 3.5–5.0)
Alkaline Phosphatase: 80 U/L (ref 38–126)
Anion gap: 12 (ref 5–15)
BUN: 20 mg/dL (ref 8–23)
CO2: 22 mmol/L (ref 22–32)
Calcium: 8.8 mg/dL — ABNORMAL LOW (ref 8.9–10.3)
Chloride: 100 mmol/L (ref 98–111)
Creatinine, Ser: 0.74 mg/dL (ref 0.44–1.00)
GFR, Estimated: >60 mL/min (ref >60)
Glucose, Bld: 143 mg/dL — ABNORMAL HIGH (ref 70–99)
Potassium: 4.2 mmol/L (ref 3.5–5.1)
Sodium: 134 mmol/L — ABNORMAL LOW (ref 135–145)
Total Bilirubin: 1.5 mg/dL — ABNORMAL HIGH (ref 0.3–1.2)
Total Protein: 6.7 g/dL (ref 6.5–8.1)

## 2023-09-05 LAB — HIV ANTIBODY (ROUTINE TESTING W REFLEX): HIV Screen 4th Generation wRfx: NONREACTIVE

## 2023-09-05 LAB — CBC
HCT: 37.3 % (ref 36.0–46.0)
Hemoglobin: 12.1 g/dL (ref 12.0–15.0)
MCH: 32.8 pg (ref 26.0–34.0)
MCHC: 32.4 g/dL (ref 30.0–36.0)
MCV: 101.1 fL — ABNORMAL HIGH (ref 80.0–100.0)
Platelets: 147 10*3/uL — ABNORMAL LOW (ref 150–400)
RBC: 3.69 MIL/uL — ABNORMAL LOW (ref 3.87–5.11)
RDW: 15.1 % (ref 11.5–15.5)
WBC: 2.5 10*3/uL — ABNORMAL LOW (ref 4.0–10.5)
nRBC: 0 % (ref 0.0–0.2)

## 2023-09-05 LAB — STREP PNEUMONIAE URINARY ANTIGEN: Strep Pneumo Urinary Antigen: NEGATIVE

## 2023-09-05 SURGERY — ECHOCARDIOGRAM, TRANSESOPHAGEAL
Anesthesia: General

## 2023-09-05 MED ORDER — BUTAMBEN-TETRACAINE-BENZOCAINE 2-2-14 % EX AERO
INHALATION_SPRAY | CUTANEOUS | Status: AC
  Filled 2023-09-05: qty 5

## 2023-09-05 MED ORDER — LIDOCAINE VISCOUS HCL 2 % MT SOLN
OROMUCOSAL | Status: AC
  Filled 2023-09-05: qty 15

## 2023-09-05 MED ORDER — AMIODARONE HCL IN DEXTROSE 360-4.14 MG/200ML-% IV SOLN
60.0000 mg/h | INTRAVENOUS | Status: DC
  Administered 2023-09-05: 60 mg/h via INTRAVENOUS
  Filled 2023-09-05 (×3): qty 200

## 2023-09-05 MED ORDER — PROPOFOL 10 MG/ML IV BOLUS
INTRAVENOUS | Status: DC | PRN
  Administered 2023-09-05 (×4): 20 mg via INTRAVENOUS
  Administered 2023-09-05: 30 mg via INTRAVENOUS
  Administered 2023-09-05: 10 mg via INTRAVENOUS

## 2023-09-05 MED ORDER — ADULT MULTIVITAMIN W/MINERALS CH
1.0000 | ORAL_TABLET | Freq: Every day | ORAL | Status: DC
  Administered 2023-09-06 – 2023-09-09 (×4): 1 via ORAL
  Filled 2023-09-05 (×4): qty 1

## 2023-09-05 MED ORDER — AMIODARONE LOAD VIA INFUSION
150.0000 mg | Freq: Once | INTRAVENOUS | Status: AC
  Administered 2023-09-05: 150 mg via INTRAVENOUS
  Filled 2023-09-05 (×2): qty 83.34

## 2023-09-05 MED ORDER — PHENYLEPHRINE HCL (PRESSORS) 10 MG/ML IV SOLN
INTRAVENOUS | Status: DC | PRN
  Administered 2023-09-05: 80 ug via INTRAVENOUS

## 2023-09-05 MED ORDER — ENSURE ENLIVE PO LIQD
237.0000 mL | Freq: Two times a day (BID) | ORAL | Status: DC
  Administered 2023-09-06: 237 mL via ORAL

## 2023-09-05 MED ORDER — AMIODARONE HCL IN DEXTROSE 360-4.14 MG/200ML-% IV SOLN
30.0000 mg/h | INTRAVENOUS | Status: DC
  Administered 2023-09-05 – 2023-09-07 (×4): 30 mg/h via INTRAVENOUS
  Filled 2023-09-05 (×4): qty 200

## 2023-09-05 NOTE — Plan of Care (Signed)
  Problem: Education: Goal: Knowledge of General Education information will improve Description Including pain rating scale, medication(s)/side effects and non-pharmacologic comfort measures Outcome: Progressing   

## 2023-09-05 NOTE — Progress Notes (Signed)
Initial Nutrition Assessment  DOCUMENTATION CODES:   Not applicable  INTERVENTION:   -Once diet is advanced, add:   -Ensure Enlive po BID, each supplement provides 350 kcal and 20 grams of protein.  -MVI with minerals daily -RD provided "Low Sodium Nutrition Therapy" handout from [facility] Nutrition Care Manual; attached to AVS/ discharge summary  NUTRITION DIAGNOSIS:   Increased nutrient needs related to chronic illness (COPD) as evidenced by estimated needs.  GOAL:   Patient will meet greater than or equal to 90% of their needs  MONITOR:   PO intake, Supplement acceptance, Diet advancement  REASON FOR ASSESSMENT:   Consult Assessment of nutrition requirement/status  ASSESSMENT:   Pt with medical history significant of multiple medical issues including Parkinson's disease, COPD, mitral regurgitation status post mitral clip in 2023 with redo May 2024, chronic HFpEF, history PE, CAD, GERD, hypertension presented with acute respiratory failure with hypoxia, A-fib with RVR, acute on chronic HFpEF, pneumonia, COPD exacerbation, sepsis.  Patient reports progressive increased work of breathing over the past 4 to 5 days PTA.  Pt admitted with a-fib with RVR.   Reviewed I/O's: +67 ml x 24 hours  Pt unavailable at time of visit. Pt out of room at time of visit x 2. No family present. RD unable to obtain further nutrition-related history or complete nutrition-focused physical exam at this time.     Pt currently NPO for TEE/ cardioversion today. Noted previously on a heart healthy diet. No meal completion data available to assess at this time.   Reviewed wt hx; wt has been stable over the past month. Noted pt with mild edema per nursing assessment, which may be masking true weight loss as well as fat and muscle depletions.   Medications reviewed and include lasix and potassium chloride.   Labs reviewed: Na: 134.    Diet Order:   Diet Order             Diet NPO time specified  Except for: Sips with Meds  Diet effective now                   EDUCATION NEEDS:   No education needs have been identified at this time  Skin:  Skin Assessment: Reviewed RN Assessment  Last BM:  Unknown  Height:   Ht Readings from Last 1 Encounters:  09/04/23 5\' 2"  (1.575 m)    Weight:   Wt Readings from Last 1 Encounters:  09/04/23 54 kg    Ideal Body Weight:  50 kg  BMI:  Body mass index is 21.77 kg/m.  Estimated Nutritional Needs:   Kcal:  1500-1700  Protein:  75-90 grams  Fluid:  > 1.5 L    Levada Schilling, RD, LDN, CDCES Registered Dietitian III Certified Diabetes Care and Education Specialist Please refer to [facility] for RD and/or RD on-call/weekend/after hours pager

## 2023-09-05 NOTE — Progress Notes (Signed)
*  PRELIMINARY RESULTS* Echocardiogram Echocardiogram Transesophageal has been performed.  Carolyne Fiscal 09/05/2023, 1:51 PM

## 2023-09-05 NOTE — Progress Notes (Signed)
OT Cancellation Note  Patient Details Name: Crystal Bass MRN: [id number] DOB: 07-20-1941   Cancelled Treatment:    Reason Eval/Treat Not Completed: Other (comment). Order received, chart reviewed. RN with pt upon attempt. RN requests therapy hold this morning and re-attempt this afternoon as appropriate. Per RN, pending cardiology to determine next steps for Afib.   Arman Filter., MPH, MS, OTR/L ascom [phone] 09/05/23, 10:28 AM

## 2023-09-05 NOTE — Progress Notes (Signed)
*  PRELIMINARY RESULTS* Echocardiogram A Limited 2D Echocardiogram has been performed.  Crystal Bass 09/05/2023, 12:42 PM

## 2023-09-05 NOTE — Anesthesia Preprocedure Evaluation (Signed)
Anesthesia Evaluation  Patient identified by MRN, date of birth, ID band Patient awake  General Assessment Comment:Prior difficult airway with DL, however straightforward intubation with video laryngoscopy.  Reviewed: Allergy & Precautions, H&P , NPO status , Patient's Chart, lab work & pertinent test results  Airway Mallampati: III  TM Distance: >3 FB Neck ROM: Limited    Dental  (+) Caps   Pulmonary asthma , pneumonia, COPD   Pulmonary exam normal breath sounds clear to auscultation       Cardiovascular hypertension, + angina  + CAD and +CHF  + dysrhythmias Atrial Fibrillation + Valvular Problems/Murmurs MR  Rhythm:Regular Rate:Normal  04-03-23 Successful TEER of the mitral valve with a MitraClip G4 XTW device, positioned A2/P2, lateral to the indwelling Clip devices   MR reduced from 4+ to 2+ post-procedure   05-15-23 1. Left ventricular ejection fraction, by estimation, is 60 to 65%. The  left ventricle has normal function. The left ventricle has no regional  wall motion abnormalities. There is mild left ventricular hypertrophy.  Left ventricular diastolic parameters  are indeterminate.   2. Right ventricular systolic function is normal. The right ventricular  size is mildly enlarged. There is mildly elevated pulmonary artery  systolic pressure. The estimated right ventricular systolic pressure is  38.5 mmHg.   3. The mitral valve has been repaired/replaced. There is a Mitra-Clip  present in the mitral position. The average mean gradient is at HR  56bpm. There is moderate mitral regurgitation that appears slightly more  significant compared to prior TTE  best appreciated on SAX views.   4. Left atrial size was severely dilated.   5. An iatrogenic ASD is present with left to right shunting. Evidence of  atrial level shunting detected by color flow Doppler.   6. Right atrial size was moderately dilated.   7. The  aortic valve is tricuspid. There is mild calcification of the  aortic valve. There is mild thickening of the aortic valve. Aortic valve  regurgitation is not visualized. Aortic valve sclerosis/calcification is  present, without any evidence of  aortic stenosis.   8. Aortic dilatation noted. There is mild dilatation of the ascending  aorta, measuring 41 mm.   9. The inferior vena cava is normal in size with greater than 50%  respiratory variability, suggesting right atrial pressure of 3 mmHg.   Now has moderate MR    Neuro/Psych Parkinson's disease  Neuromuscular disease  negative psych ROS   GI/Hepatic Neg liver ROS,GERD  ,,  Endo/Other  negative endocrine ROS    Renal/GU negative Renal ROS  negative genitourinary   Musculoskeletal  (+) Arthritis ,    Abdominal   Peds negative pediatric ROS (+)  Hematology negative hematology ROS (+)   Anesthesia Other Findings Pancreatitis Post-thoracotomy pain syndrome Endometriosis  Aneurysm of aorta (HCC) Osteoporosis, post-menopausal  Ductal carcinoma of breast, estrogen receptor positive, stage 1 (HCC) Pseudomonas pneumonia (HCC)  COPD (chronic obstructive pulmonary disease)  Mastalgia  Hypercalcemia  Vitamin D deficiency Parkinson disease  Breast cancer (HCC) Hypertension  Arthritis Asthma  GERD (gastroesophageal reflux disease) Cataract  Anginal pain (HCC) Status post implantation of mitral valve leaflet clip  Chronic heart failure with preserved ejection fraction (HFpEF) (HCC) Mitral regurgitation  Palpitations Coronary artery disease     Reproductive/Obstetrics negative OB ROS                             Anesthesia Physical Anesthesia Plan  ASA: 4  Anesthesia Plan: General   Post-op Pain Management: Minimal or no pain anticipated   Induction: Intravenous  PONV Risk Score and Plan: 3 and Treatment may vary due to age or medical condition, Propofol infusion and TIVA  Airway  Management Planned: Natural Airway and Nasal Cannula  Additional Equipment: None  Intra-op Plan:   Post-operative Plan:   Informed Consent: I have reviewed the patients History and Physical, chart, labs and discussed the procedure including the risks, benefits and alternatives for the proposed anesthesia with the patient or authorized representative who has indicated his/her understanding and acceptance.     Dental Advisory Given  Plan Discussed with: Anesthesiologist, CRNA and Surgeon  Anesthesia Plan Comments: (Patient consented for risks of anesthesia including but not limited to:  - adverse reactions to medications - damage to eyes, teeth, lips or other oral mucosa - nerve damage due to positioning  - sore throat or hoarseness - Damage to heart, brain, nerves, lungs, other parts of body or loss of life  Patient voiced understanding.)        Anesthesia Quick Evaluation

## 2023-09-05 NOTE — Procedures (Signed)
Transesophageal Echocardiogram :  Indication: atrial fibrillation  Procedure Details:  Consent: Risks of procedure as well as the alternatives and risks of each were explained to the (patient/caregiver).  Consent for procedure obtained.  Time Out: Verified patient identification, verified procedure, site/side was marked, verified correct patient position, special equipment/implants available, medications/allergies/relevent history reviewed, required imaging and test results available.   Procedure: 10ml of viscous lidocaine were given orally to provide local anesthesia to the oropharynx. The patient was positioned supine on the left side, bite block provided. The patient was sedated per anesthesia team Using digital technique an omniplane probe was advanced into the esophagus without incident.    See report in EPIC  for complete details: In brief, imaging revealed normal LV function with no RWMAs and no mural apical thrombus.  .  Estimated ejection fraction was 55%.   Mild to moderate mitral regurgitation Severely dilated left atrium  Imaging of the septum showed a small ASD 2D and color flow confirmed ASD  The LA was well visualized in orthogonal views.  There was no thrombus in the LA and LA appendage   The descending thoracic aorta had no  mural aortic debris with no evidence of aneurysmal dilation or dissection  Cardioversion procedure note For atrial fibrillation.  Patient placed on cardiac monitor, pulse oximetry, supplemental oxygen as necessary.   Sedation given: propofol IV per anesthesia team Pacer pads placed anterior and posterior chest.   Cardioverted 1 time(s).   Cardioverted at  150J. Synchronized biphasic Converted to NSR/sinus bradycardia  Evaluation: Findings: Post procedure EKG shows: NSR/sinus brady Complications: None Patient did tolerate procedure well.  Time Spent Directly with the Patient:  25 minutes   Debbe Odea, M.D.    Arlys John  Agbor-Etang 09/05/2023 1:25 PM

## 2023-09-05 NOTE — Progress Notes (Signed)
Progress Note  Patient Name: Crystal Bass Date of Encounter: 09/05/2023  Primary Cardiologist: Kirke Corin  Subjective   She remains in Afib with RVR this morning with ventricular rates in the low 100s to 140s bpm. Planning for TEE-guided DCCV today. BP 90s to low 100s mmHg systolic. Remains on diltiazem gtt. Chronic dyspnea and lower extremity swelling unchanged from baseline. No chest pain.   Inpatient Medications    Scheduled Meds:  apixaban  2.5 mg Oral BID   Carbidopa-Levodopa ER  2 tablet Oral Daily   And   Carbidopa-Levodopa ER  1 tablet Oral BID   clonazePAM  0.5 mg Oral QHS   furosemide  40 mg Intravenous BID   pantoprazole  20 mg Oral Daily   potassium chloride SA  20 mEq Oral Daily   predniSONE  40 mg Oral Q breakfast   Continuous Infusions:  sodium chloride 20 mL/hr at 09/04/23 2200   azithromycin Stopped (09/04/23 1610)   cefTRIAXone (ROCEPHIN)  IV Stopped (09/04/23 1448)   diltiazem (CARDIZEM) infusion 5 mg/hr (09/04/23 2003)   PRN Meds: acetaminophen, levalbuterol, ondansetron **OR** ondansetron (ZOFRAN) IV   Vital Signs    Vitals:   09/05/23 0101 09/05/23 0200 09/05/23 0400 09/05/23 0600  BP: 100/76 94/73 (!) 105/92 108/83  Pulse: 80 (!) 120 92 89  Resp: 20 19 (!) 26 20  Temp: (!) 97.5 F (36.4 C) (!) 97.5 F (36.4 C) (!) 97.5 F (36.4 C) (!) 97.5 F (36.4 C)  TempSrc:      SpO2: 97% 97% 100% 98%  Weight:      Height:        Intake/Output Summary (Last 24 hours) at 09/05/2023 0754 Last data filed at 09/05/2023 0500 Gross per 24 hour  Intake 66.77 ml  Output --  Net 66.77 ml   Filed Weights   09/04/23 1025  Weight: 54 kg    Telemetry    Afib with RVR with ventricular rates in the low 100s to 140s bpm - Personally Reviewed  ECG    No new tracings - Personally Reviewed  Physical Exam   GEN: No acute distress, elderly and frail appearing.   Neck: JVD elevated ~ 8 cm. Cardiac: Tachycardic, IRIR, I/VI systolic murmur, no rubs, or  gallops.  Respiratory: Diminished breath sounds bilaterally.  GI: Soft, nontender, non-distended.   MS: Trivial bilateral pretibial edema; No deformity. Neuro:  Alert and oriented x 3; Nonfocal.  Psych: Normal affect.  Labs    Chemistry Recent Labs  Lab 09/04/23 1033 09/05/23 0521  NA 133* 134*  K 4.9 4.2  CL 103 100  CO2 23 22  GLUCOSE 109* 143*  BUN 24* 20  CREATININE 0.98 0.74  CALCIUM 9.7 8.8*  PROT  --  6.7  ALBUMIN  --  3.4*  AST  --  21  ALT  --  14  ALKPHOS  --  80  BILITOT  --  1.5*  GFRNONAA 58* >60  ANIONGAP 7 12     Hematology Recent Labs  Lab 09/04/23 1033 09/05/23 0521  WBC 6.2 2.5*  RBC 3.89 3.69*  HGB 12.7 12.1  HCT 40.5 37.3  MCV 104.1* 101.1*  MCH 32.6 32.8  MCHC 31.4 32.4  RDW 15.3 15.1  PLT 165 147*    Cardiac EnzymesNo results for input(s): "TROPONINI" in the last 168 hours. No results for input(s): "TROPIPOC" in the last 168 hours.   BNP Recent Labs  Lab 09/04/23 1057  BNP 1,724.3*  DDimer No results for input(s): "DDIMER" in the last 168 hours.   Radiology    DG Chest Port 1 View  Result Date: 09/04/2023 IMPRESSION: 1. Chronic medial right upper lobe airspace opacity related to the bronchiectasis and atelectasis better seen on prior CT. 2. Left basilar retrocardiac opacity is similar to prior and may represent atelectasis or pneumonia. Electronically Signed   By: Neita Garnet M.D.   On: 09/04/2023 11:35    Cardiac Studies   TEE pending __________  2D echo 05/15/2023: 1. Left ventricular ejection fraction, by estimation, is 60 to 65%. The  left ventricle has normal function. The left ventricle has no regional  wall motion abnormalities. There is mild left ventricular hypertrophy.  Left ventricular diastolic parameters  are indeterminate.   2. Right ventricular systolic function is normal. The right ventricular  size is mildly enlarged. There is mildly elevated pulmonary artery  systolic pressure. The estimated  right ventricular systolic pressure is  38.5 mmHg.   3. The mitral valve has been repaired/replaced. There is a Mitra-Clip  present in the mitral position. The average mean gradient is at HR  56bpm. There is moderate mitral regurgitation that appears slightly more  significant compared to prior TTE  best appreciated on SAX views.   4. Left atrial size was severely dilated.   5. An iatrogenic ASD is present with left to right shunting. Evidence of  atrial level shunting detected by color flow Doppler.   6. Right atrial size was moderately dilated.   7. The aortic valve is tricuspid. There is mild calcification of the  aortic valve. There is mild thickening of the aortic valve. Aortic valve  regurgitation is not visualized. Aortic valve sclerosis/calcification is  present, without any evidence of  aortic stenosis.   8. Aortic dilatation noted. There is mild dilatation of the ascending  aorta, measuring 41 mm.   9. The inferior vena cava is normal in size with greater than 50%  respiratory variability, suggesting right atrial pressure of 3 mmHg.   Patient Profile     82 y.o. female with history of mitral regurgitation s/p mitral valve clip x 3, recently diagnosed Afib in 08/2023, HFpEF, Parkinson's disease, and orthostatic hypotension who we are seeing for Afib with RVR in the context of valvular heart disease and acute on chronic HFpEF.  Assessment & Plan    1. Afib with RVR: -Recently diagnosed in the office on 08/27/2023 -Not well tolerated in the setting of her valvular heart disease -Remains in Afib with RVR with ventricular rates in the low 100s to 140s bpm -Remains on diltiazem gtt with relative hypotension precluding further escalation of rate control therapy  -NPO for TEE-guided DCCV today (case to start ~ 12:30) PM) -CHADS2VASc at least 5 (CHF, age x 2, vascular disease, sex category) -Eliquis 2.5 mg bid (age and weight) -Maintain potassium goal 4.0  2. Valvular heart  disease: -Status post mitral vale clip x 3 -TEE pending -Outpatient follow up  3. Acute on chronic HFpEF: -Exacerbated by Afib with RVR and mitral valve disease -IV Lasix 40 mg bid  -Escalate GDMT as able, BP precludes at this time  4. Elevated troponin: -Mildly elevated -Likely supply demand ischemia in the setting of Afib with RVR and volume overload -LHC in 2022 showed normal coronary arteries -Echo pending   Informed Consent   Shared Decision Making/Informed Consent{  The risks [stroke, cardiac arrhythmias rarely resulting in the need for a temporary or permanent pacemaker, skin irritation  or burns, esophageal damage, perforation (1:10,000 risk), bleeding, pharyngeal hematoma as well as other potential complications associated with conscious sedation including aspiration, arrhythmia, respiratory failure and death], benefits (treatment guidance, restoration of normal sinus rhythm, diagnostic support) and alternatives of a transesophageal echocardiogram guided cardioversion were discussed in detail with Ms. Brisk and she is willing to proceed.      For questions or updates, please contact [facility] Please consult [email or web address] for contact info under Cardiology/STEMI.    Signed, Eula Listen, PA-C [facility] Pager: [phone] 09/05/2023, 7:54 AM

## 2023-09-05 NOTE — Progress Notes (Signed)
Progress Note   Patient: Crystal Bass AVW:[id number] DOB: Aug 07, 1941 DOA: 09/04/2023     1 DOS: the patient was seen and examined on 09/05/2023   Subjective:  Patient seen and examined at bedside this morning Currently on 1.5 L of intranasal oxygen and also on diltiazem drip on account of persistent tachycardia Denied worsening chest pain nausea vomiting or abdominal pain Has been seen by cardiologist and being planned for TEE with cardioversion   Brief hospital course: From HPI "Crystal Bass is a 82 y.o. female with medical history significant of multiple medical issues including Parkinson's disease, COPD, mitral regurgitation status post mitral clip in 2023 with redo May 2024, chronic HFpEF, history PE, CAD, GERD, hypertension presented with acute respiratory failure with hypoxia, A-fib with RVR, acute on chronic HFpEF, pneumonia, COPD exacerbation, sepsis.  "  Assessment and Plan:  Atrial fibrillation with RVR (HCC) HR into the 140s  Afib w/ RVR on EKG  Continue diltiazem Continue Xarelto Cardiologist on board and planning TEE with DC cardioversion today given persistent A-fib with RVR Plan of care discussed with cardiologist   Acute respiratory failure with hypoxia (HCC) Decompensated respiratory failure now requiring 2 L nasal cannula to keep O2 sats greater than 94% in the setting of A-fib with RVR,?  Pneumonia on imaging, decompensated heart failure as well as baseline COPD Positive mild increased work of breathing at baseline IV Solu-Medrol as well as as needed Xopenex for COPD  Left basilar retrocardiac infiltrate on chest x-ray Continue Rocephin azithromycin for infectious coverage Continue current oxygen requirement         Acute on chronic heart failure with preserved ejection fraction (HFpEF) (HCC) Decompensated resp status w/ noted volume overload in setting of afib w/ RVR, COPD, PNA BNP 1700 (well above baseline)  CXR w/ noted acute vs. Chronic  opacities  2+ pitting edema bilaterally  Cardiologist on board and case discussed Follow up recommendations      CAP (community acquired pneumonia) Decompensated resp status requiring 2L [place] with noted ? L retrocardiac PNA in setting of decompensated COPD, CHF and afib w/ RVR  Noted admission September 2024 for similar issue IV rocephin and azithromycin for infectious coverage  Blood and resp cultures  Consider further imaging as well as pulmonary/ID consultation if resp sxs fail to improve/worsen     Sepsis [facility]) Meeting sepsis criteria by heart rate in the 100s in setting A-fib with RVR as well as respirations into the 30s Noted concurrent A-fib with RVR, pneumonia, acute on chronic HFpEF, COPD exacerbations Continue on IV Rocephin azithromycin for pulmonary coverage Pancultured Lactate and procalcitonin pending Deferring IV fluids in the setting of active HFpEF exacerbation     COPD (chronic obstructive pulmonary disease) (HCC) + decompensated COPD in setting of acute resp failure w/ hypoxia  + mild wheezing  IV solumedrol  Prn xopenex in setting of afib w/ RVR  Antibiotics per pneumonia coverage     Essential hypertension BP stable  Titrate home regimen      S/P mitral valve clip implantation S/p MitraClip's x 2 due to inadequate symptomatic improvement after first MitraClip in 2023    Parkinson disease (HCC) Cont sinemet    Daytime somnolence + chronic somnolence at baseline        Advance Care Planning:   Code Status: Limited: Do not attempt resuscitation (DNR) -DNR-LIMITED -Do Not Intubate/DNI     Consults: Cardiology    Family Communication: No family at the bedside  Physical Exam: Constitutional: Patient seen laying in bed appears anxious    Head: Normocephalic.     Nose: Nose normal.     Mouth/Throat:     Mouth: Mucous membranes are moist.  Eyes:     Pupils: Pupils are equal, round, and reactive to light.  Cardiovascular:     Rate and  Rhythm: Tachycardia present. Rhythm irregular.  Pulmonary: Decreased air entry at the bases Abdominal:     General: Bowel sounds are normal.  Musculoskeletal:     Cervical back: Normal range of motion.     Comments: Positive generalized weakness  Skin:    General: Skin is warm.  Neurological:     General: No focal deficit present.     Comments: Positive generalized weakness and fatigue Otherwise grossly nonfocal neuroexam  Psychiatric:        Mood and Affect: Mood normal.   Data Reviewed: I have reviewed patient chest x-ray showing some findings of infiltrate, I have also reviewed patient's CBC as well as CMP results  Disposition: Status is: Inpatient  Time spent: 56 minutes  Vitals:   09/05/23 1345 09/05/23 1400 09/05/23 1500 09/05/23 1614  BP: (!) 89/60 101/74 110/82   Pulse: (!) 48 60 62 62  Resp: (!) 33 18    Temp:   97.6 F (36.4 C) 97.6 F (36.4 C)  TempSrc:   Oral   SpO2: 97% 97%  97%  Weight:      Height:          Author: Loyce Dys, MD 09/05/2023 5:25 PM  For on call review [email or web address].

## 2023-09-05 NOTE — Transfer of Care (Signed)
Immediate Anesthesia Transfer of Care Note  Patient: Crystal Bass  Procedure(s) Performed: TRANSESOPHAGEAL ECHOCARDIOGRAM CARDIOVERSION  Patient Location: short stay cardiology  Anesthesia Type:General  Level of Consciousness: drowsy  Airway & Oxygen Therapy: Patient Spontanous Breathing and Patient connected to nasal cannula oxygen  Post-op Assessment: Report given to RN and Post -op Vital signs reviewed and stable  Post vital signs: Reviewed and stable  Last Vitals:  Vitals Value Taken Time  BP 85/61 09/05/23 1326  Temp    Pulse 46 09/05/23 1326  Resp 26 09/05/23 1326  SpO2 95 % 09/05/23 1326    Last Pain:  Vitals:   09/05/23 1158  TempSrc: Oral  PainSc: 0-No pain         Complications: No notable events documented.

## 2023-09-05 NOTE — ED Notes (Signed)
Pt transported to 2A by assigned RN, Reaonna and EDT, [place] at this time. Pts DNR sent with pt at time of transport with assigned RN.

## 2023-09-05 NOTE — Discharge Instructions (Signed)

## 2023-09-05 NOTE — Progress Notes (Signed)
PT Cancellation Note  Patient Details Name: Crystal Bass MRN: [id number] DOB: 1941-09-05   Cancelled Treatment:    Reason Eval/Treat Not Completed: Medical issues which prohibited therapy Pt has continued to have a-fib/tachy issues, nursing requests to hold activity this AM.  Cardio with possibility for TEE, will maintain on caseload and continue to follow from a distance.   Malachi Pro, DPT 09/05/2023, 10:51 AM

## 2023-09-05 NOTE — Anesthesia Procedure Notes (Signed)
Date/Time: 09/05/2023 12:54 PM  Performed by: Ginger Carne, CRNAPre-anesthesia Checklist: Patient identified, Emergency Drugs available, Suction available, Patient being monitored and Timeout performed Patient Re-evaluated:Patient Re-evaluated prior to induction Oxygen Delivery Method: Nasal cannula Preoxygenation: Pre-oxygenation with 100% oxygen Induction Type: IV induction

## 2023-09-05 NOTE — Anesthesia Postprocedure Evaluation (Signed)
Anesthesia Post Note  Patient: Crystal Bass  Procedure(s) Performed: TRANSESOPHAGEAL ECHOCARDIOGRAM CARDIOVERSION  Patient location during evaluation: [facility] Recovery Anesthesia Type: General Level of consciousness: awake and alert Pain management: pain level controlled Vital Signs Assessment: post-procedure vital signs reviewed and stable Respiratory status: spontaneous breathing, nonlabored ventilation, respiratory function stable and patient connected to nasal cannula oxygen Cardiovascular status: blood pressure returned to baseline and stable Postop Assessment: no apparent nausea or vomiting Anesthetic complications: no   No notable events documented.   Last Vitals:  Vitals:   09/05/23 1326 09/05/23 1330  BP: (!) 85/61 (!) 82/57  Pulse: (!) 46 (!) 47  Resp: (!) 26 (!) 24  Temp:    SpO2: 95% 95%    Last Pain:  Vitals:   09/05/23 1158  TempSrc: Oral  PainSc: 0-No pain                 Corinda Gubler

## 2023-09-06 ENCOUNTER — Ambulatory Visit: Payer: Medicare PPO | Admitting: Student

## 2023-09-06 DIAGNOSIS — I4891 Unspecified atrial fibrillation: Secondary | ICD-10-CM | POA: Diagnosis not present

## 2023-09-06 DIAGNOSIS — I5033 Acute on chronic diastolic (congestive) heart failure: Secondary | ICD-10-CM | POA: Diagnosis not present

## 2023-09-06 LAB — CBC WITH DIFFERENTIAL/PLATELET
Abs Immature Granulocytes: 0.02 10*3/uL (ref 0.00–0.07)
Basophils Absolute: 0 10*3/uL (ref 0.0–0.1)
Basophils Relative: 0 %
Eosinophils Absolute: 0 10*3/uL (ref 0.0–0.5)
Eosinophils Relative: 0 %
HCT: 36.4 % (ref 36.0–46.0)
Hemoglobin: 11.9 g/dL — ABNORMAL LOW (ref 12.0–15.0)
Immature Granulocytes: 0 %
Lymphocytes Relative: 4 %
Lymphs Abs: 0.2 10*3/uL — ABNORMAL LOW (ref 0.7–4.0)
MCH: 33.1 pg (ref 26.0–34.0)
MCHC: 32.7 g/dL (ref 30.0–36.0)
MCV: 101.1 fL — ABNORMAL HIGH (ref 80.0–100.0)
Monocytes Absolute: 0.4 10*3/uL (ref 0.1–1.0)
Monocytes Relative: 7 %
Neutro Abs: 4.5 10*3/uL (ref 1.7–7.7)
Neutrophils Relative %: 89 %
Platelets: 145 10*3/uL — ABNORMAL LOW (ref 150–400)
RBC: 3.6 MIL/uL — ABNORMAL LOW (ref 3.87–5.11)
RDW: 15 % (ref 11.5–15.5)
WBC: 5.2 10*3/uL (ref 4.0–10.5)
nRBC: 0 % (ref 0.0–0.2)

## 2023-09-06 LAB — LEGIONELLA PNEUMOPHILA SEROGP 1 UR AG: L. pneumophila Serogp 1 Ur Ag: NEGATIVE

## 2023-09-06 LAB — ECHO TEE

## 2023-09-06 LAB — BASIC METABOLIC PANEL
Anion gap: 12 (ref 5–15)
BUN: 27 mg/dL — ABNORMAL HIGH (ref 8–23)
CO2: 23 mmol/L (ref 22–32)
Calcium: 8.4 mg/dL — ABNORMAL LOW (ref 8.9–10.3)
Chloride: 100 mmol/L (ref 98–111)
Creatinine, Ser: 1.07 mg/dL — ABNORMAL HIGH (ref 0.44–1.00)
GFR, Estimated: 53 mL/min — ABNORMAL LOW (ref >60)
Glucose, Bld: 149 mg/dL — ABNORMAL HIGH (ref 70–99)
Potassium: 4.1 mmol/L (ref 3.5–5.1)
Sodium: 135 mmol/L (ref 135–145)

## 2023-09-06 MED ORDER — UMECLIDINIUM BROMIDE 62.5 MCG/ACT IN AEPB
1.0000 | INHALATION_SPRAY | Freq: Every day | RESPIRATORY_TRACT | Status: DC
  Administered 2023-09-06 – 2023-09-09 (×3): 1 via RESPIRATORY_TRACT
  Filled 2023-09-06: qty 7

## 2023-09-06 MED ORDER — FLUTICASONE FUROATE-VILANTEROL 100-25 MCG/ACT IN AEPB
1.0000 | INHALATION_SPRAY | Freq: Every day | RESPIRATORY_TRACT | Status: DC
  Administered 2023-09-06 – 2023-09-09 (×3): 1 via RESPIRATORY_TRACT
  Filled 2023-09-06: qty 28

## 2023-09-06 MED ORDER — FUROSEMIDE 10 MG/ML IJ SOLN
40.0000 mg | Freq: Every day | INTRAMUSCULAR | Status: DC
  Administered 2023-09-06 – 2023-09-07 (×2): 40 mg via INTRAVENOUS
  Filled 2023-09-06 (×2): qty 4

## 2023-09-06 MED ORDER — POTASSIUM CHLORIDE CRYS ER 20 MEQ PO TBCR
20.0000 meq | EXTENDED_RELEASE_TABLET | Freq: Every day | ORAL | Status: DC
  Administered 2023-09-06 – 2023-09-09 (×4): 20 meq via ORAL
  Filled 2023-09-06 (×5): qty 1

## 2023-09-06 NOTE — Care Management Important Message (Signed)
Important Message  Patient Details  Name: Crystal Bass MRN: [id number] Date of Birth: Jul 15, 1941   Important Message Given:  N/A - LOS <3 / Initial given by admissions     [name] 09/06/2023, 1:20 PM

## 2023-09-06 NOTE — Progress Notes (Signed)
Progress Note   Patient: Crystal Bass:[id number] DOB: 07-10-1941 DOA: 09/04/2023     2 DOS: the patient was seen and examined on 09/06/2023     Subjective:  Patient seen and examined Underwent TEE yesterday with improvement in tachycardia Denies nausea vomiting abdominal pain or chest pain     Brief hospital course: From HPI "Crystal Bass is a 82 y.o. female with medical history significant of multiple medical issues including Parkinson's disease, COPD, mitral regurgitation status post mitral clip in 2023 with redo May 2024, chronic HFpEF, history PE, CAD, GERD, hypertension presented with acute respiratory failure with hypoxia, A-fib with RVR, acute on chronic HFpEF, pneumonia, COPD exacerbation, sepsis.  "   Assessment and Plan:   Atrial fibrillation with RVR (HCC) HR into the 140s  Afib w/ RVR on EKG  We will continue diltiazem drip today as recommended by cardiologist and to switch to oral therapy by tomorrow Continue Xarelto Given persistent A-fib with RVR patient underwent TEE with DC cardioversion on 09/05/2023 I have discussed the plan of care with cardiologist   Acute respiratory failure with hypoxia (HCC) Decompensated respiratory failure now requiring 2 L nasal cannula to keep O2 sats greater than 94% in the setting of A-fib with RVR,?  Pneumonia on imaging, decompensated heart failure as well as baseline COPD Positive mild increased work of breathing at baseline IV Solu-Medrol as well as as needed Xopenex for COPD  Left basilar retrocardiac infiltrate on chest x-ray Continue Rocephin azithromycin for infectious coverage Continue current oxygen requirements     Acute on chronic heart failure with preserved ejection fraction (HFpEF) (HCC) Decompensated resp status w/ noted volume overload in setting of afib w/ RVR, COPD, PNA BNP 1700 (well above baseline)  CXR w/ noted acute vs. Chronic opacities  2+ pitting edema bilaterally  Cardiologist on board and  case discussed Follow up recommendations      CAP (community acquired pneumonia) Decompensated resp status requiring 2L [place] with noted ? L retrocardiac PNA in setting of decompensated COPD, CHF and afib w/ RVR  Noted admission September 2024 for similar issue Continue current IV antibiotics IV rocephin and azithromycin for infectious coverage  Continue to follow-up on culture results   Sepsis [facility]) Meeting sepsis criteria by heart rate in the 100s in setting A-fib with RVR as well as respirations into the 30s Noted concurrent A-fib with RVR, pneumonia, acute on chronic HFpEF, COPD exacerbations Continue IV Rocephin azithromycin for pulmonary coverage Follow-up on culture results Lactate and procalcitonin pending We will continue deferring IV fluids in the setting of active HFpEF exacerbation     COPD (chronic obstructive pulmonary disease) (HCC) + decompensated COPD in setting of acute resp failure w/ hypoxia  Wheezing resolved IV solumedrol  Prn xopenex in setting of afib w/ RVR  Antibiotics per pneumonia coverage  Continue nebulization therapy     Essential hypertension BP stable  Titrate home regimen      S/P mitral valve clip implantation S/p MitraClip's x 2 due to inadequate symptomatic improvement after first MitraClip in 2023    Parkinson disease (HCC) Continue sinemet    Daytime somnolence + chronic somnolence at baseline         Advance Care Planning:   Code Status: Limited: Do not attempt resuscitation (DNR) -DNR-LIMITED -Do Not Intubate/DNI     Consults: Cardiology    Family Communication: No family at the bedside          Physical Exam: Constitutional: Patient laying in bed  no more anxious    Head: Normocephalic.     Nose: Nose normal.     Mouth/Throat:     Mouth: Mucous membranes are moist.  Eyes:     Pupils: Pupils are equal, round, and reactive to light.  Cardiovascular:     Rate and Rhythm: Tachycardia resolved Abdominal:     General:  Bowel sounds are normal.  Musculoskeletal:     Cervical back: Normal range of motion.     Comments: Positive generalized weakness  Skin:    General: Skin is warm.  Neurological:     General: No focal deficit present.     Comments: Positive generalized weakness and fatigue Otherwise grossly nonfocal neuroexam  Psychiatric:        Mood and Affect: Mood normal.    Data Reviewed: I have reviewed cardiology documentation, below mentioned labs including CBC and BMP results, I have also reviewed nursing documentation, labs, vitals,  PT OT as well as transition of care manager documentation   Disposition: Status is: Inpatient   Time spent: 46 minutes    Vitals:   09/06/23 0700 09/06/23 0800 09/06/23 1149 09/06/23 1624  BP: 124/85 126/89 116/84 117/82  Pulse: 66 69 64 63  Resp: 20 (!) 26 18 18   Temp:  97.8 F (36.6 C) 98.3 F (36.8 C) 97.8 F (36.6 C)  TempSrc:  Oral Oral Oral  SpO2: 98% 97% 96% 96%  Weight:      Height:         Author: Loyce Dys, MD 09/06/2023 5:25 PM  For on call review [email or web address].

## 2023-09-06 NOTE — Evaluation (Signed)
Occupational Therapy Evaluation Patient Details Name: Crystal Bass MRN: [id number] DOB: 11-27-1940 Today's Date: 09/06/2023   History of Present Illness 82 y.o. female with medical history significant of multiple medical issues including Parkinson's disease, COPD, mitral regurgitation status post mitral clip in 2023 with redo May 2024, chronic HFpEF, history PE, CAD, GERD, hypertension presented with acute respiratory failure with hypoxia, A-fib with RVR, acute on chronic HFpEF, pneumonia, COPD exacerbation, sepsis.   Clinical Impression   Pt was seen for OT evaluation this date. Pt reports transitioning from IL at [facility] to the ALF side this past summer and over the past couple weeks has required increased assist for bathing, dressing, and toileting from staff. She reports using a rollator and typically able to ambulate from her apartment to/from the dining hall for meals. Pt denies falls in past 17mo. Pt presents to acute OT demonstrating impaired ADL performance and functional mobility 2/2 decreased strength, activity tolerance, and balance (See OT problem list for additional functional deficits). Pt currently requires MOD A for LB ADL, MOD A for ADL transfers and bed mobility, and MOD A for toileting. Pt would benefit from skilled OT services to address noted impairments and functional limitations (see below for any additional details) in order to maximize safety and independence while minimizing falls risk and caregiver burden.     If plan is discharge home, recommend the following: A little help with walking and/or transfers;A lot of help with bathing/dressing/bathroom;Assistance with cooking/housework;Assist for transportation;Help with stairs or ramp for entrance    Functional Status Assessment  Patient has had a recent decline in their functional status and demonstrates the ability to make significant improvements in function in a reasonable and predictable amount of time.   Equipment Recommendations  None recommended by OT    Recommendations for Other Services       Precautions / Restrictions Precautions Precautions: Fall Precaution Comments: monitor HR Restrictions Weight Bearing Restrictions: No      Mobility Bed Mobility Overal bed mobility: Needs Assistance Bed Mobility: Sit to Supine       Sit to supine: Mod assist   General bed mobility comments: seated EOB with NT upon OT arrival    Transfers Overall transfer level: Needs assistance Equipment used: None Transfers: Sit to/from Stand, Bed to chair/wheelchair/BSC Sit to Stand: Mod assist Stand pivot transfers: Mod assist         General transfer comment: STS to [facility]      Balance Overall balance assessment: Needs assistance Sitting-balance support: Feet supported, Single extremity supported Sitting balance-Leahy Scale: Fair     Standing balance support: Bilateral upper extremity supported Standing balance-Leahy Scale: Poor                             ADL either performed or assessed with clinical judgement   ADL Overall ADL's : Needs assistance/impaired                                       General ADL Comments: Pt requires MOD A for LB ADL tasks, MIN A for seated UB ADL, and MIN A for SPT to BSC.     Vision         Perception         Praxis         Pertinent Vitals/Pain Pain Assessment Pain Assessment: No/denies pain  Extremity/Trunk Assessment Upper Extremity Assessment Upper Extremity Assessment: Generalized weakness   Lower Extremity Assessment Lower Extremity Assessment: Generalized weakness       Communication Communication Communication: Hearing impairment   Cognition Arousal: Alert Behavior During Therapy: WFL for tasks assessed/performed Overall Cognitive Status: Within Functional Limits for tasks assessed                                 General Comments: grossly WFL, endorses some mild  STM deficits     General Comments       Exercises     Shoulder Instructions      Home Living Family/patient expects to be discharged to:: Assisted living                             Home Equipment: Rollator (4 wheels);Shower Counsellor (2 wheels);BSC/3in1;Wheelchair - manual   Additional Comments: uses BSC over commode, adjustable bed      Prior Functioning/Environment Prior Level of Function : Needs assist             Mobility Comments: amb with rollator to/from dining hall, room, activities until recently ADLs Comments: assist for showering, dressing, and toileting in the past few weeks; staff assist with meds, meals, cleaning, and transportation, no falls in past 43mo        OT Problem List: Decreased strength;Cardiopulmonary status limiting activity;Decreased activity tolerance;Impaired balance (sitting and/or standing);Decreased knowledge of use of DME or AE      OT Treatment/Interventions: Self-care/ADL training;Therapeutic exercise;Therapeutic activities;Energy conservation;DME and/or AE instruction;Patient/family education;Balance training    OT Goals(Current goals can be found in the care plan section) Acute Rehab OT Goals Patient Stated Goal: go back to [facility] OT Goal Formulation: With patient Time For Goal Achievement: 09/20/23 Potential to Achieve Goals: Good ADL Goals Pt Will Transfer to Toilet: with contact guard assist;ambulating (BSC over toilet, LRAD) Pt Will Perform Toileting - Clothing Manipulation and hygiene: sitting/lateral leans;with set-up;with supervision Pt Will Perform Tub/Shower Transfer: with contact guard assist;ambulating;shower seat;rolling walker;grab bars Additional ADL Goal #1: Pt will complete grooming tasks while seated >76min with good static sitting balance, 2/2 opportunities.  OT Frequency: Min 1X/week    Co-evaluation              AM-PAC OT "6 Clicks" Daily Activity     Outcome Measure Help from  another person eating meals?: None Help from another person taking care of personal grooming?: A Little Help from another person toileting, which includes using toliet, bedpan, or urinal?: A Lot Help from another person bathing (including washing, rinsing, drying)?: A Lot Help from another person to put on and taking off regular upper body clothing?: A Little Help from another person to put on and taking off regular lower body clothing?: A Lot 6 Click Score: 16   End of Session    Activity Tolerance: Patient tolerated treatment well Patient left: in bed;with call bell/phone within reach;with bed alarm set  OT Visit Diagnosis: Other abnormalities of gait and mobility (R26.89);Muscle weakness (generalized) (M62.81)                Time: 2725-3664 OT Time Calculation (min): 14 min Charges:  OT General Charges $OT Visit: 1 Visit OT Evaluation $OT Eval Low Complexity: 1 Low  Arman Filter., MPH, MS, OTR/L ascom [phone] 09/06/23, 9:05 AM

## 2023-09-06 NOTE — TOC Initial Note (Signed)
Transition of Care [facility]) - Initial/Assessment Note    Patient Details  Name: Crystal Bass MRN: [id number] Date of Birth: 02/28/1941  Transition of Care [facility]) CM/SW Contact:    Darolyn Rua, LCSW Phone Number: 09/06/2023, 2:18 PM  Clinical Narrative:                  Patient from [facility]F with recs for SNF, and[facility] at [facility] snf updated and FL2 completed.   Patient will need insurance auth for [facility] SNF once nearing medical readiness.   Expected Discharge Plan: Skilled Nursing Facility Barriers to Discharge: Continued Medical Work up   Patient Goals and CMS Choice Patient states their goals for this hospitalization and ongoing recovery are:: to go home CMS Medicare.gov Compare Post Acute Care list provided to:: Patient Choice offered to / list presented to : Patient      Expected Discharge Plan and Services                                              Prior Living Arrangements/Services                       Activities of Daily Living   ADL Screening (condition at time of admission) Independently performs ADLs?: No Does the patient have a NEW difficulty with bathing/dressing/toileting/self-feeding that is expected to last >3 days?: Yes (Initiates electronic notice to provider for possible OT consult) Does the patient have a NEW difficulty with getting in/out of bed, walking, or climbing stairs that is expected to last >3 days?: Yes (Initiates electronic notice to provider for possible PT consult) Does the patient have a NEW difficulty with communication that is expected to last >3 days?: No Is the patient deaf or have difficulty hearing?: No Does the patient have difficulty seeing, even when wearing glasses/contacts?: No Does the patient have difficulty concentrating, remembering, or making decisions?: No  Permission Sought/Granted                  Emotional Assessment              Admission diagnosis:  Atrial  fibrillation with rapid ventricular response (HCC) [I48.91] Atrial fibrillation with RVR (HCC) [I48.91] Acute on chronic congestive heart failure, unspecified heart failure type (HCC) [I50.9] Patient Active Problem List   Diagnosis Date Noted   Atrial fibrillation with rapid ventricular response (HCC) 09/05/2023   Atrial fibrillation with RVR (HCC) 09/04/2023   Sepsis (HCC) 09/04/2023   Acute cystitis 09/02/2023   Atrial fibrillation (HCC) 09/02/2023   Daytime somnolence 08/19/2023   Pulmonary nodules 08/09/2023   Frailty 08/09/2023   COVID-19 07/08/2023   Hypervolemia 02/08/2023   Acute respiratory failure with hypoxia (HCC) 11/22/2022   Acute on chronic heart failure with preserved ejection fraction (HFpEF) (HCC) 08/20/2022   Closed left hip fracture (HCC) 08/19/2022   Severe mitral regurgitation 02/15/2022   S/P mitral valve clip implantation 02/15/2022   Ovarian tumor of borderline malignancy, right 10/28/2020   Angioedema 05/04/2020   Chronic neuropathic pain 01/27/2020   Acute midline low back pain with right-sided sciatica 10/26/2019   [name] (mycobacterium avium-intracellulare) infection (HCC) 11/01/2018   History of pulmonary embolism 10/31/2018   Orthostatic hypotension 08/25/2018   Chronic fatigue 10/23/2017   Parkinson disease (HCC) 01/10/2016   Hypercalcemia 09/14/2013   Osteoporosis, post-menopausal 03/14/2012  Dyspnea on exertion, acute on chronic 03/28/2011   CAP (community acquired pneumonia) 09/27/2009   COPD (chronic obstructive pulmonary disease) (HCC) 03/25/2008   History of left breast cancer 03/10/2008   Essential hypertension 02/10/2007   PCP:  Eustaquio Boyden, MD Pharmacy:   [facility] - [place], [place] - [address] [place] [address] Phone: [phone] Fax: [phone]     Social Determinants of Health (SDOH) Social History: SDOH Screenings   Food Insecurity: No Food Insecurity  (09/05/2023)  Housing: Low Risk  (09/05/2023)  Transportation Needs: No Transportation Needs (09/05/2023)  Utilities: Not At Risk (09/05/2023)  Alcohol Screen: Low Risk  (08/13/2022)  Depression (PHQ2-9): Low Risk  (12/07/2022)  Financial Resource Strain: Low Risk  (08/13/2022)  Physical Activity: Insufficiently Active (08/13/2022)  Social Connections: Socially Isolated (08/13/2022)  Stress: No Stress Concern Present (08/13/2022)  Tobacco Use: Low Risk  (09/04/2023)   SDOH Interventions:     Readmission Risk Interventions     No data to display

## 2023-09-06 NOTE — Evaluation (Signed)
Physical Therapy Evaluation Patient Details Name: Crystal Bass MRN: [id number] DOB: Feb 02, 1941 Today's Date: 09/06/2023  History of Present Illness  82 y.o. female with medical history significant of multiple medical issues including Parkinson's disease, COPD, mitral regurgitation status post mitral clip in 2023 with redo May 2024, chronic HFpEF, history PE, CAD, GERD, hypertension presented with acute respiratory failure with hypoxia, A-fib with RVR, acute on chronic HFpEF, pneumonia, COPD exacerbation, sepsis  Clinical Impression  Pt was pleasant and motivated to participate during the session and put forth good effort throughout. Pt HR monitored throughout session, pt remaining high 60's low 70's throughout. Pt needs mod A +2 to sit fully upright, Min A for STS transfers from variable heights and surfaces, VC's provided for hand placement. Pt able to amb 3 feet x2, seated on BSC between bouts. Pt taking very short steps, overall slow but no imbalance noted, min cues for RW use. Pt will benefit from continued PT services upon discharge to safely address deficits listed in patient problem list for decreased caregiver assistance and eventual return to PLOF.          If plan is discharge home, recommend the following: A little help with walking and/or transfers;A little help with bathing/dressing/bathroom;Assist for transportation;Help with stairs or ramp for entrance;Direct supervision/assist for financial management;Assistance with cooking/housework   Can travel by private vehicle   Yes    Equipment Recommendations Other (comment) (TBD)  Recommendations for Other Services       Functional Status Assessment Patient has had a recent decline in their functional status and demonstrates the ability to make significant improvements in function in a reasonable and predictable amount of time.     Precautions / Restrictions Precautions Precautions: Fall Precaution Comments: monitor  HR Restrictions Weight Bearing Restrictions: No Other Position/Activity Restrictions: Target HR: 65-105 bpm      Mobility  Bed Mobility Overal bed mobility: Needs Assistance Bed Mobility: Sit to Supine       Sit to supine: Mod assist, +2 for physical assistance   General bed mobility comments: needed help, able to reach over and begin mobility, but needs +2 to sit fully up.    Transfers Overall transfer level: Needs assistance Equipment used: Rolling walker (2 wheels) Transfers: Sit to/from Stand Sit to Stand: Min assist           General transfer comment: from variable heights and surfaces.    Ambulation/Gait Ambulation/Gait assistance: Contact guard assist Gait Distance (Feet): 3 Feet x2 Assistive device: Rolling walker (2 wheels) Gait Pattern/deviations: Step-through pattern, Decreased step length - left, Decreased step length - right, Decreased stride length Gait velocity: decreased     General Gait Details: 2 short bouts. Short steps, overall slow. no imbalance seen, minimal cues for RW use  Stairs            Wheelchair Mobility     Tilt Bed    Modified Rankin (Stroke Patients Only)       Balance Overall balance assessment: Needs assistance Sitting-balance support: Feet supported, Single extremity supported Sitting balance-Leahy Scale: Fair     Standing balance support: Bilateral upper extremity supported Standing balance-Leahy Scale: Poor                               Pertinent Vitals/Pain Pain Assessment Pain Assessment: No/denies pain    Home Living Family/patient expects to be discharged to:: Assisted living  Home Equipment: Rollator (4 wheels);Shower Counsellor (2 wheels);BSC/3in1;Wheelchair - manual Additional Comments: uses BSC over commode, adjustable bed    Prior Function Prior Level of Function : Needs assist             Mobility Comments: amb with rollator to/from dining  hall, room, activities until recently ADLs Comments: assist for showering, dressing, and toileting in the past few weeks; staff assist with meds, meals, cleaning, and transportation, no falls in past 34mo     Extremity/Trunk Assessment   Upper Extremity Assessment Upper Extremity Assessment: Generalized weakness    Lower Extremity Assessment Lower Extremity Assessment: Generalized weakness    Cervical / Trunk Assessment Cervical / Trunk Assessment: Normal  Communication   Communication Communication: Hearing impairment  Cognition Arousal: Alert Behavior During Therapy: WFL for tasks assessed/performed Overall Cognitive Status: Within Functional Limits for tasks assessed                                          General Comments      Exercises     Assessment/Plan    PT Assessment Patient needs continued PT services  PT Problem List Decreased strength;Decreased coordination;Decreased range of motion;Decreased activity tolerance;Decreased balance;Decreased mobility       PT Treatment Interventions DME instruction;Balance training;Gait training;Stair training;Functional mobility training;Therapeutic activities;Therapeutic exercise    PT Goals (Current goals can be found in the Care Plan section)  Acute Rehab PT Goals Patient Stated Goal: walk more PT Goal Formulation: With patient Time For Goal Achievement: 09/19/23 Potential to Achieve Goals: Fair    Frequency Min 1X/week     Co-evaluation               AM-PAC PT "6 Clicks" Mobility  Outcome Measure Help needed turning from your back to your side while in a flat bed without using bedrails?: A Little Help needed moving from lying on your back to sitting on the side of a flat bed without using bedrails?: A Lot   Help needed standing up from a chair using your arms (e.g., wheelchair or bedside chair)?: A Little Help needed to walk in hospital room?: A Little Help needed climbing 3-5 steps  with a railing? : A Lot 6 Click Score: 13    End of Session Equipment Utilized During Treatment: Gait belt Activity Tolerance: Patient tolerated treatment well Patient left: in chair;with call bell/phone within reach;with chair alarm set Nurse Communication: Mobility status      Time: 1115-1141 PT Time Calculation (min) (ACUTE ONLY): 26 min   Charges:                 Cecile Sheerer, SPT 09/06/23, 1:48 PM

## 2023-09-06 NOTE — Progress Notes (Signed)
Progress Note  Patient Name: Crystal Bass Date of Encounter: 09/06/2023  Primary Cardiologist: Kirke Corin  Subjective   She underwent successful TEE guided DCCV on 09/05/2023.  Reports she did not sleep well last evening due to "alarms."  She indicates that she is not able to tell us if she feels any different this morning in sinus rhythm because of this.  She denies chest pain.  Inpatient Medications    Scheduled Meds:  apixaban  2.5 mg Oral BID   Carbidopa-Levodopa ER  2 tablet Oral Daily   And   Carbidopa-Levodopa ER  1 tablet Oral BID   clonazePAM  0.5 mg Oral QHS   feeding supplement  237 mL Oral BID BM   furosemide  40 mg Intravenous BID   multivitamin with minerals  1 tablet Oral Daily   pantoprazole  20 mg Oral Daily   potassium chloride SA  20 mEq Oral Daily   predniSONE  40 mg Oral Q breakfast   Continuous Infusions:  amiodarone 30 mg/hr (09/05/23 1959)   azithromycin 500 mg (09/05/23 1820)   cefTRIAXone (ROCEPHIN)  IV 2 g (09/05/23 1750)   PRN Meds: acetaminophen, levalbuterol, ondansetron **OR** ondansetron (ZOFRAN) IV   Vital Signs    Vitals:   09/05/23 2344 09/06/23 0000 09/06/23 0129 09/06/23 0334  BP:  114/83    Pulse:  (!) 57    Resp:  (!) 22    Temp: (!) 97 F (36.1 C)  (!) 97.4 F (36.3 C) 97.7 F (36.5 C)  TempSrc:   Oral   SpO2:  93%    Weight:      Height:        Intake/Output Summary (Last 24 hours) at 09/06/2023 0732 Last data filed at 09/05/2023 1820 Gross per 24 hour  Intake 75 ml  Output 650 ml  Net -575 ml   Filed Weights   09/04/23 1025  Weight: 54 kg    Telemetry    Sinus rhythm with short atrial runs - Personally Reviewed  ECG    Sinus bradycardia, 50 bpm, LVH, nonspecific ST-T changes - Personally Reviewed  Physical Exam   GEN: No acute distress, elderly and frail appearing.   Neck: JVD elevated ~ 8 cm. Cardiac: RRR, I/VI systolic murmur, no rubs, or gallops.  Respiratory: Diminished breath sounds  bilaterally.  GI: Soft, nontender, non-distended.   MS: Trivial bilateral pretibial edema; No deformity. Neuro:  Alert and oriented x 3; Nonfocal.  Psych: Normal affect.  Labs    Chemistry Recent Labs  Lab 09/04/23 1033 09/05/23 0521 09/06/23 0345  NA 133* 134* 135  K 4.9 4.2 4.1  CL 103 100 100  CO2 23 22 23   GLUCOSE 109* 143* 149*  BUN 24* 20 27*  CREATININE 0.98 0.74 1.07*  CALCIUM 9.7 8.8* 8.4*  PROT  --  6.7  --   ALBUMIN  --  3.4*  --   AST  --  21  --   ALT  --  14  --   ALKPHOS  --  80  --   BILITOT  --  1.5*  --   GFRNONAA 58* >60 53*  ANIONGAP 7 12 12      Hematology Recent Labs  Lab 09/04/23 1033 09/05/23 0521 09/06/23 0345  WBC 6.2 2.5* 5.2  RBC 3.89 3.69* 3.60*  HGB 12.7 12.1 11.9*  HCT 40.5 37.3 36.4  MCV 104.1* 101.1* 101.1*  MCH 32.6 32.8 33.1  MCHC 31.4 32.4 32.7  RDW 15.3 15.1  15.0  PLT 165 147* 145*    Cardiac EnzymesNo results for input(s): "TROPONINI" in the last 168 hours. No results for input(s): "TROPIPOC" in the last 168 hours.   BNP Recent Labs  Lab 09/04/23 1057  BNP 1,724.3*     DDimer No results for input(s): "DDIMER" in the last 168 hours.   Radiology    DG Chest Port 1 View  Result Date: 09/04/2023 IMPRESSION: 1. Chronic medial right upper lobe airspace opacity related to the bronchiectasis and atelectasis better seen on prior CT. 2. Left basilar retrocardiac opacity is similar to prior and may represent atelectasis or pneumonia. Electronically Signed   By: Neita Garnet M.D.   On: 09/04/2023 11:35    Cardiac Studies   TEE/DCCV10/08/2023: Normal LV function with no RWMAs and no mural apical thrombus.  .  Estimated ejection fraction was 55%.    Mild to moderate mitral regurgitation Severely dilated left atrium   Imaging of the septum showed a small ASD 2D and color flow confirmed ASD   The LA was well visualized in orthogonal views.  There was no thrombus in the LA and LA appendage    The descending thoracic  aorta had no  mural aortic debris with no evidence of aneurysmal dilation or dissection   Cardioversion procedure note For atrial fibrillation.   Patient placed on cardiac monitor, pulse oximetry, supplemental oxygen as necessary.   Sedation given: propofol IV per anesthesia team Pacer pads placed anterior and posterior chest.     Cardioverted 1 time(s).   Cardioverted at  150J. Synchronized biphasic Converted to NSR/sinus bradycardia   Evaluation: Findings: Post procedure EKG shows: NSR/sinus brady Complications: None Patient did tolerate procedure well. __________  Limited echo 09/05/2023: 1. Left ventricular ejection fraction, by estimation, is 55 to 60%. The  left ventricle has normal function. The left ventricle has no regional  wall motion abnormalities. There is moderate left ventricular hypertrophy.  Left ventricular diastolic  parameters are indeterminate.   2. Right ventricular systolic function is normal. The right ventricular  size is mildly enlarged. Tricuspid regurgitation signal is inadequate for  assessing PA pressure.   3. Left atrial size was moderately dilated.   4. The mitral valve is normal in structure. s/p mitral valve clip.  Moderate mitral valve regurgitation. Moderate mitral annular  calcification. Degree of mitral valve stenosis was not measured.   5. The aortic valve has been repaired/replaced. Aortic valve  regurgitation is not visualized. No aortic stenosis is present.   6. The inferior vena cava is normal in size with greater than 50%  respiratory variability, suggesting right atrial pressure of 3 mmHg.   7. There is a mild to moderately sized atrial septal defect with  predominantly left to right shunting across the atrial septum.  __________  2D echo 05/15/2023: 1. Left ventricular ejection fraction, by estimation, is 60 to 65%. The  left ventricle has normal function. The left ventricle has no regional  wall motion abnormalities. There is  mild left ventricular hypertrophy.  Left ventricular diastolic parameters  are indeterminate.   2. Right ventricular systolic function is normal. The right ventricular  size is mildly enlarged. There is mildly elevated pulmonary artery  systolic pressure. The estimated right ventricular systolic pressure is  38.5 mmHg.   3. The mitral valve has been repaired/replaced. There is a Mitra-Clip  present in the mitral position. The average mean gradient is at HR  56bpm. There is moderate mitral regurgitation that appears slightly more  significant compared to prior TTE  best appreciated on SAX views.   4. Left atrial size was severely dilated.   5. An iatrogenic ASD is present with left to right shunting. Evidence of  atrial level shunting detected by color flow Doppler.   6. Right atrial size was moderately dilated.   7. The aortic valve is tricuspid. There is mild calcification of the  aortic valve. There is mild thickening of the aortic valve. Aortic valve  regurgitation is not visualized. Aortic valve sclerosis/calcification is  present, without any evidence of  aortic stenosis.   8. Aortic dilatation noted. There is mild dilatation of the ascending  aorta, measuring 41 mm.   9. The inferior vena cava is normal in size with greater than 50%  respiratory variability, suggesting right atrial pressure of 3 mmHg.   Patient Profile     82 y.o. female with history of mitral regurgitation s/p mitral valve clip x 3, recently diagnosed Afib in 08/2023, HFpEF, Parkinson's disease, and orthostatic hypotension who we are seeing for Afib with RVR in the context of valvular heart disease and acute on chronic HFpEF.  Assessment & Plan    1. Afib with RVR: -Recently diagnosed in the office on 08/27/2023 -Not well tolerated in the setting of her valvular heart disease -Status post successful TEE guided DCCV on 09/05/2023 -Maintaining sinus rhythm with short atrial runs -Continue IV amiodarone  today with recommendation to transition to oral amiodarone on 09/07/2023 -CHADS2VASc at least 5 (CHF, age x 2, vascular disease, sex category) -Eliquis 2.5 mg bid (age and weight) -Maintain potassium goal 4.0  2. Valvular heart disease: -Status post mitral vale clip x 3 -TEE this admission showed mild to moderate mitral regurgitation -Outpatient follow up  3. Acute on chronic HFpEF: -Exacerbated by Afib with RVR and mitral valve disease -Volume improving -Decrease IV Lasix to IV Lasix 40 mg daily -Escalate GDMT as able, BP precludes at this time  4. Elevated troponin: -Mildly elevated -Likely supply demand ischemia in the setting of Afib with RVR and volume overload -LHC in 2022 showed normal coronary arteries -Echo this admission showed preserved LV systolic function with normal wall motion -No plans for inpatient ischemic testing at this time       For questions or updates, please contact [facility] Please consult [email or web address] for contact info under Cardiology/STEMI.    Signed, Eula Listen, PA-C [facility] Pager: [phone] 09/06/2023, 7:32 AM

## 2023-09-06 NOTE — [place] FL2 (Signed)
[place] MEDICAID FL2 LEVEL OF CARE FORM     IDENTIFICATION  Patient Name: Crystal Bass Birthdate: 08/21/1941 Sex: female Admission Date (Current Location): 09/04/2023  [facility] and [place] Number:  Chiropodist and Address:  [facility], [address] [place], [place] [address]      Provider Number: [id number]  Attending Physician Name and Address:  Loyce Dys, MD  Relative Name and Phone Number:  Loraine Leriche brother [phone]    Current Level of Care: Hospital Recommended Level of Care: Skilled Nursing Facility Prior Approval Number:    Date Approved/Denied:   PASRR Number: [id number]  Discharge Plan: SNF    Current Diagnoses: Patient Active Problem List   Diagnosis Date Noted   Atrial fibrillation with rapid ventricular response (HCC) 09/05/2023   Atrial fibrillation with RVR (HCC) 09/04/2023   Sepsis (HCC) 09/04/2023   Acute cystitis 09/02/2023   Atrial fibrillation (HCC) 09/02/2023   Daytime somnolence 08/19/2023   Pulmonary nodules 08/09/2023   Frailty 08/09/2023   COVID-19 07/08/2023   Hypervolemia 02/08/2023   Acute respiratory failure with hypoxia (HCC) 11/22/2022   Acute on chronic heart failure with preserved ejection fraction (HFpEF) (HCC) 08/20/2022   Closed left hip fracture (HCC) 08/19/2022   Severe mitral regurgitation 02/15/2022   S/P mitral valve clip implantation 02/15/2022   Ovarian tumor of borderline malignancy, right 10/28/2020   Angioedema 05/04/2020   Chronic neuropathic pain 01/27/2020   Acute midline low back pain with right-sided sciatica 10/26/2019   [name] (mycobacterium avium-intracellulare) infection (HCC) 11/01/2018   History of pulmonary embolism 10/31/2018   Orthostatic hypotension 08/25/2018   Chronic fatigue 10/23/2017   Parkinson disease (HCC) 01/10/2016   Hypercalcemia 09/14/2013   Osteoporosis, post-menopausal 03/14/2012   Dyspnea on exertion, acute on chronic 03/28/2011   CAP  (community acquired pneumonia) 09/27/2009   COPD (chronic obstructive pulmonary disease) (HCC) 03/25/2008   History of left breast cancer 03/10/2008   Essential hypertension 02/10/2007    Orientation RESPIRATION BLADDER Height & Weight     Self, Time, Situation, Place  Normal Continent Weight: 119 lb 0.8 oz (54 kg) Height:  5\' 2"  (157.5 cm)  BEHAVIORAL SYMPTOMS/MOOD NEUROLOGICAL BOWEL NUTRITION STATUS      Continent Diet (see discharge summary)  AMBULATORY STATUS COMMUNICATION OF NEEDS Skin   Limited Assist Verbally Normal                       Personal Care Assistance Level of Assistance  Bathing, Feeding, Dressing, Total care Bathing Assistance: Limited assistance Feeding assistance: Independent Dressing Assistance: Limited assistance Total Care Assistance: Limited assistance   Functional Limitations Info  Sight, Hearing, Speech Sight Info: Adequate Hearing Info: Adequate Speech Info: Adequate    SPECIAL CARE FACTORS FREQUENCY  PT (By licensed PT), OT (By licensed OT)     PT Frequency: min 4x weekly OT Frequency: min 4x weekly            Contractures Contractures Info: Not present    Additional Factors Info  Code Status, Allergies Code Status Info: dnr - limited Allergies Info: Sulfa Antibiotics  Sulfonamide Derivatives  Topamax (Topiramate)  Clarithromycin  Hydrocodone  Motrin (Ibuprofen)  Misc. Sulfonamide Containing Compounds  Symbicort (Budesonide-formoterol Fumarate)  Tetracycline Hcl  Percocet (Oxycodone-acetaminophen)  Tape  Tetracyclines & Related           Current Medications (09/06/2023):  This is the current hospital active medication list Current Facility-Administered Medications  Medication Dose Route Frequency Provider Last  Rate Last Admin   acetaminophen (TYLENOL) tablet 650 mg  650 mg Oral Q6H PRN Floydene Flock, MD   650 mg at 09/05/23 2120   amiodarone (NEXTERONE PREMIX) 360-4.14 MG/200ML-% (1.8 mg/mL) IV infusion  30 mg/hr  Intravenous Continuous [place] Ouch, MD   Stopped at 09/06/23 1305   apixaban (ELIQUIS) tablet 2.5 mg  2.5 mg Oral BID Floydene Flock, MD   2.5 mg at 09/06/23 0920   azithromycin (ZITHROMAX) 500 mg in sodium chloride 0.9 % 250 mL IVPB  500 mg Intravenous Q24H Floydene Flock, MD   Stopped at 09/06/23 0700   Carbidopa-Levodopa ER (SINEMET CR) 25-100 MG tablet controlled release 2 tablet  2 tablet Oral Daily Mila Merry A, RPH   2 tablet at 09/06/23 0920   And   Carbidopa-Levodopa ER (SINEMET CR) 25-100 MG tablet controlled release 1 tablet  1 tablet Oral BID Mila Merry A, RPH   1 tablet at 09/05/23 1747   cefTRIAXone (ROCEPHIN) 2 g in sodium chloride 0.9 % 100 mL IVPB  2 g Intravenous Q24H Floydene Flock, MD   Stopped at 09/06/23 0700   clonazePAM (KLONOPIN) tablet 0.5 mg  0.5 mg Oral QHS Floydene Flock, MD   0.5 mg at 09/05/23 2120   fluticasone furoate-vilanterol (BREO ELLIPTA) 100-25 MCG/ACT 1 puff  1 puff Inhalation Daily Rosezetta Schlatter T, MD   1 puff at 09/06/23 1000   And   umeclidinium bromide (INCRUSE ELLIPTA) 62.5 MCG/ACT 1 puff  1 puff Inhalation Daily Rosezetta Schlatter T, MD   1 puff at 09/06/23 1001   furosemide (LASIX) injection 40 mg  40 mg Intravenous Daily Eula Listen M, PA-C   40 mg at 09/06/23 2202   levalbuterol (XOPENEX) nebulizer solution 0.63 mg  0.63 mg Nebulization Q6H PRN Floydene Flock, MD       multivitamin with minerals tablet 1 tablet  1 tablet Oral Daily Rosezetta Schlatter T, MD   1 tablet at 09/06/23 0920   ondansetron (ZOFRAN) tablet 4 mg  4 mg Oral Q6H PRN Floydene Flock, MD       Or   ondansetron [facility]) injection 4 mg  4 mg Intravenous Q6H PRN Floydene Flock, MD       pantoprazole (PROTONIX) EC tablet 20 mg  20 mg Oral Daily Floydene Flock, MD   20 mg at 09/06/23 1000   potassium chloride SA (KLOR-CON M) CR tablet 20 mEq  20 mEq Oral Daily Eula Listen M, PA-C   20 mEq at 09/06/23 0920   predniSONE (DELTASONE) tablet 40 mg  40 mg Oral Q breakfast Floydene Flock, MD   40 mg at 09/06/23 0920     Discharge Medications: Please see discharge summary for a list of discharge medications.  Relevant Imaging Results:  Relevant Lab Results:   Additional Information SSN: [id number]  Darolyn Rua, LCSW

## 2023-09-06 NOTE — Progress Notes (Signed)
Nutrition Follow-up  DOCUMENTATION CODES:   Severe malnutrition in context of chronic illness  INTERVENTION:   -MVI with minerals daily -D/c Ensure Enlive -Magic cup TID with meals, each supplement provides 290 kcal and 9 grams of protein  -Liberalize diet to regular for wider variety of meal sections  NUTRITION DIAGNOSIS:   Severe Malnutrition related to chronic illness (COPD) as evidenced by moderate fat depletion, severe fat depletion, moderate muscle depletion, severe muscle depletion.  Ongoing  GOAL:   Patient will meet greater than or equal to 90% of their needs  Progressing   MONITOR:   PO intake, Supplement acceptance  REASON FOR ASSESSMENT:   Consult Assessment of nutrition requirement/status  ASSESSMENT:   Pt with medical history significant of multiple medical issues including Parkinson's disease, COPD, mitral regurgitation status post mitral clip in 2023 with redo May 2024, chronic HFpEF, history PE, CAD, GERD, hypertension presented with acute respiratory failure with hypoxia, A-fib with RVR, acute on chronic HFpEF, pneumonia, COPD exacerbation, sepsis.  Patient reports progressive increased work of breathing over the past 4 to 5 days PTA.  10/10- s/p TEE/ cardioversion  Reviewed I/O's: -575 ml x 24 hours and -508 ml since admission  UOP: 650 ml x 24 hours   Pt sitting up in recliner chair at time of visit, with flat affect. Pt reports feeling no better or worse since admission. Pt shares that she has a poor appetite at baseline. Noted she consumed about 50% of her sandwich for lunch. Noted meal completions 50-100%.   Per pt, she usually consumes 1-2 meals per day, but unable to offer diet recall. Pt shares that she has lost "some wt over the past year" due to poor oral intake, but weight has "picked back up". Reviewed wt hx; wt has been stable over the past month. Noted pt with mild edema per nursing assessment, which may be masking true weight loss as well  as fat and muscle depletions.   Discussed importance of good meal and supplement intake to promote healing. Pt reports she "hates" all supplements and would eat "if the food was better". RD will liberalize diet to regular for wider variety of meal selections, however, pt does not think this will be helpful. Also gave pt permission to have visitors bring outside food if desired.   Per TOC notes, plan for SNF placement at discharge.   Medications reviewed and include sinemet, lasix, potassium chloride, amiodarone, and prednisone.   Labs reviewed.    NUTRITION - FOCUSED PHYSICAL EXAM:  Flowsheet Row Most Recent Value  Orbital Region Severe depletion  Upper Arm Region Moderate depletion  Thoracic and Lumbar Region Moderate depletion  Buccal Region Severe depletion  Temple Region Moderate depletion  Clavicle Bone Region Severe depletion  Clavicle and Acromion Bone Region Severe depletion  Scapular Bone Region Severe depletion  Dorsal Hand Moderate depletion  Patellar Region Moderate depletion  Anterior Thigh Region Moderate depletion  Posterior Calf Region Moderate depletion  Edema (RD Assessment) Mild  Hair Reviewed  Eyes Reviewed  Mouth Reviewed  Skin Reviewed  Nails Reviewed       Diet Order:   Diet Order             Diet regular Room service appropriate? Yes; Fluid consistency: Thin  Diet effective now                   EDUCATION NEEDS:   Education needs have been addressed  Skin:  Skin Assessment: Reviewed RN Assessment  Last BM:  Unknown  Height:   Ht Readings from Last 1 Encounters:  09/04/23 5\' 2"  (1.575 m)    Weight:   Wt Readings from Last 1 Encounters:  09/04/23 54 kg    Ideal Body Weight:  50 kg  BMI:  Body mass index is 21.77 kg/m.  Estimated Nutritional Needs:   Kcal:  1500-1700  Protein:  75-90 grams  Fluid:  > 1.5 L    Levada Schilling, RD, LDN, CDCES Registered Dietitian III Certified Diabetes Care and Education  Specialist Please refer to [facility] for RD and/or RD on-call/weekend/after hours pager

## 2023-09-07 DIAGNOSIS — I4891 Unspecified atrial fibrillation: Secondary | ICD-10-CM | POA: Diagnosis not present

## 2023-09-07 DIAGNOSIS — I5033 Acute on chronic diastolic (congestive) heart failure: Secondary | ICD-10-CM | POA: Diagnosis not present

## 2023-09-07 LAB — BASIC METABOLIC PANEL
Anion gap: 9 (ref 5–15)
BUN: 27 mg/dL — ABNORMAL HIGH (ref 8–23)
CO2: 26 mmol/L (ref 22–32)
Calcium: 7.8 mg/dL — ABNORMAL LOW (ref 8.9–10.3)
Chloride: 100 mmol/L (ref 98–111)
Creatinine, Ser: 0.82 mg/dL (ref 0.44–1.00)
GFR, Estimated: >60 mL/min (ref >60)
Glucose, Bld: 111 mg/dL — ABNORMAL HIGH (ref 70–99)
Potassium: 3.6 mmol/L (ref 3.5–5.1)
Sodium: 135 mmol/L (ref 135–145)

## 2023-09-07 LAB — CBC WITH DIFFERENTIAL/PLATELET
Abs Immature Granulocytes: 0.02 10*3/uL (ref 0.00–0.07)
Basophils Absolute: 0 10*3/uL (ref 0.0–0.1)
Basophils Relative: 0 %
Eosinophils Absolute: 0 10*3/uL (ref 0.0–0.5)
Eosinophils Relative: 0 %
HCT: 36.8 % (ref 36.0–46.0)
Hemoglobin: 11.8 g/dL — ABNORMAL LOW (ref 12.0–15.0)
Immature Granulocytes: 0 %
Lymphocytes Relative: 10 %
Lymphs Abs: 0.6 10*3/uL — ABNORMAL LOW (ref 0.7–4.0)
MCH: 32.1 pg (ref 26.0–34.0)
MCHC: 32.1 g/dL (ref 30.0–36.0)
MCV: 100 fL (ref 80.0–100.0)
Monocytes Absolute: 0.7 10*3/uL (ref 0.1–1.0)
Monocytes Relative: 12 %
Neutro Abs: 4.8 10*3/uL (ref 1.7–7.7)
Neutrophils Relative %: 78 %
Platelets: 140 10*3/uL — ABNORMAL LOW (ref 150–400)
RBC: 3.68 MIL/uL — ABNORMAL LOW (ref 3.87–5.11)
RDW: 15 % (ref 11.5–15.5)
WBC: 6.2 10*3/uL (ref 4.0–10.5)
nRBC: 0 % (ref 0.0–0.2)

## 2023-09-07 LAB — TSH: TSH: 1.145 u[IU]/mL (ref 0.350–4.500)

## 2023-09-07 MED ORDER — AMIODARONE HCL 200 MG PO TABS
400.0000 mg | ORAL_TABLET | Freq: Two times a day (BID) | ORAL | Status: DC
  Administered 2023-09-07 – 2023-09-09 (×5): 400 mg via ORAL
  Filled 2023-09-07 (×5): qty 2

## 2023-09-07 MED ORDER — FUROSEMIDE 40 MG PO TABS
40.0000 mg | ORAL_TABLET | Freq: Every day | ORAL | Status: DC
  Administered 2023-09-08 – 2023-09-09 (×2): 40 mg via ORAL
  Filled 2023-09-07 (×2): qty 1

## 2023-09-07 MED ORDER — AZITHROMYCIN 250 MG PO TABS
500.0000 mg | ORAL_TABLET | Freq: Every day | ORAL | Status: AC
  Administered 2023-09-07 – 2023-09-08 (×2): 500 mg via ORAL
  Filled 2023-09-07 (×2): qty 2

## 2023-09-07 MED ORDER — POTASSIUM CHLORIDE CRYS ER 20 MEQ PO TBCR
40.0000 meq | EXTENDED_RELEASE_TABLET | Freq: Once | ORAL | Status: AC
  Administered 2023-09-07: 40 meq via ORAL
  Filled 2023-09-07: qty 2

## 2023-09-07 NOTE — Progress Notes (Signed)
Progress Note  Patient Name: Crystal Bass Date of Encounter: 09/07/2023  Primary Cardiologist: Kirke Corin  Subjective   She underwent successful TEE guided DCCV on 09/05/2023, maintaining sinus rhythm. No chest pain or dyspnea.   Inpatient Medications    Scheduled Meds:  apixaban  2.5 mg Oral BID   Carbidopa-Levodopa ER  2 tablet Oral Daily   And   Carbidopa-Levodopa ER  1 tablet Oral BID   clonazePAM  0.5 mg Oral QHS   fluticasone furoate-vilanterol  1 puff Inhalation Daily   And   umeclidinium bromide  1 puff Inhalation Daily   furosemide  40 mg Intravenous Daily   multivitamin with minerals  1 tablet Oral Daily   pantoprazole  20 mg Oral Daily   potassium chloride SA  20 mEq Oral Daily   predniSONE  40 mg Oral Q breakfast   Continuous Infusions:  amiodarone 30 mg/hr (09/07/23 0648)   azithromycin 250 mL/hr at 09/06/23 1643   cefTRIAXone (ROCEPHIN)  IV Stopped (09/06/23 1525)   PRN Meds: acetaminophen, levalbuterol, ondansetron **OR** ondansetron (ZOFRAN) IV   Vital Signs    Vitals:   09/06/23 1624 09/06/23 1959 09/07/23 0000 09/07/23 0500  BP: 117/82 116/76 (!) 135/91   Pulse: 63 63 63   Resp: 18 18 (!) 23   Temp: 97.8 F (36.6 C) (!) 97.5 F (36.4 C)    TempSrc: Oral  Axillary   SpO2: 96% 99% 97%   Weight:    57.1 kg  Height:        Intake/Output Summary (Last 24 hours) at 09/07/2023 0845 Last data filed at 09/07/2023 0351 Gross per 24 hour  Intake 1028.95 ml  Output 200 ml  Net 828.95 ml   Filed Weights   09/04/23 1025 09/07/23 0500  Weight: 54 kg 57.1 kg    Telemetry    Sinus rhythm - Personally Reviewed  ECG    No new tracings - Personally Reviewed  Physical Exam   GEN: No acute distress, elderly and frail appearing.   Neck: JVD not elevated. Cardiac: RRR, I/VI systolic murmur, no rubs, or gallops.  Respiratory: Diminished breath sounds bilaterally.  GI: Soft, nontender, non-distended.   MS: Trivial bilateral pretibial edema;  No deformity. Neuro:  Alert and oriented x 3; Nonfocal.  Psych: Normal affect.  Labs    Chemistry Recent Labs  Lab 09/05/23 0521 09/06/23 0345 09/07/23 0313  NA 134* 135 135  K 4.2 4.1 3.6  CL 100 100 100  CO2 22 23 26   GLUCOSE 143* 149* 111*  BUN 20 27* 27*  CREATININE 0.74 1.07* 0.82  CALCIUM 8.8* 8.4* 7.8*  PROT 6.7  --   --   ALBUMIN 3.4*  --   --   AST 21  --   --   ALT 14  --   --   ALKPHOS 80  --   --   BILITOT 1.5*  --   --   GFRNONAA >60 53* >60  ANIONGAP 12 12 9      Hematology Recent Labs  Lab 09/05/23 0521 09/06/23 0345 09/07/23 0313  WBC 2.5* 5.2 6.2  RBC 3.69* 3.60* 3.68*  HGB 12.1 11.9* 11.8*  HCT 37.3 36.4 36.8  MCV 101.1* 101.1* 100.0  MCH 32.8 33.1 32.1  MCHC 32.4 32.7 32.1  RDW 15.1 15.0 15.0  PLT 147* 145* 140*    Cardiac EnzymesNo results for input(s): "TROPONINI" in the last 168 hours. No results for input(s): "TROPIPOC" in the last 168 hours.  BNP Recent Labs  Lab 09/04/23 1057  BNP 1,724.3*     DDimer No results for input(s): "DDIMER" in the last 168 hours.   Radiology    DG Chest Port 1 View  Result Date: 09/04/2023 IMPRESSION: 1. Chronic medial right upper lobe airspace opacity related to the bronchiectasis and atelectasis better seen on prior CT. 2. Left basilar retrocardiac opacity is similar to prior and may represent atelectasis or pneumonia. Electronically Signed   By: Neita Garnet M.D.   On: 09/04/2023 11:35    Cardiac Studies   TEE/DCCV10/08/2023: Normal LV function with no RWMAs and no mural apical thrombus.  .  Estimated ejection fraction was 55%.    Mild to moderate mitral regurgitation Severely dilated left atrium   Imaging of the septum showed a small ASD 2D and color flow confirmed ASD   The LA was well visualized in orthogonal views.  There was no thrombus in the LA and LA appendage    The descending thoracic aorta had no  mural aortic debris with no evidence of aneurysmal dilation or dissection    Cardioversion procedure note For atrial fibrillation.   Patient placed on cardiac monitor, pulse oximetry, supplemental oxygen as necessary.   Sedation given: propofol IV per anesthesia team Pacer pads placed anterior and posterior chest.     Cardioverted 1 time(s).   Cardioverted at  150J. Synchronized biphasic Converted to NSR/sinus bradycardia   Evaluation: Findings: Post procedure EKG shows: NSR/sinus brady Complications: None Patient did tolerate procedure well. __________  Limited echo 09/05/2023: 1. Left ventricular ejection fraction, by estimation, is 55 to 60%. The  left ventricle has normal function. The left ventricle has no regional  wall motion abnormalities. There is moderate left ventricular hypertrophy.  Left ventricular diastolic  parameters are indeterminate.   2. Right ventricular systolic function is normal. The right ventricular  size is mildly enlarged. Tricuspid regurgitation signal is inadequate for  assessing PA pressure.   3. Left atrial size was moderately dilated.   4. The mitral valve is normal in structure. s/p mitral valve clip.  Moderate mitral valve regurgitation. Moderate mitral annular  calcification. Degree of mitral valve stenosis was not measured.   5. The aortic valve has been repaired/replaced. Aortic valve  regurgitation is not visualized. No aortic stenosis is present.   6. The inferior vena cava is normal in size with greater than 50%  respiratory variability, suggesting right atrial pressure of 3 mmHg.   7. There is a mild to moderately sized atrial septal defect with  predominantly left to right shunting across the atrial septum.  __________  2D echo 05/15/2023: 1. Left ventricular ejection fraction, by estimation, is 60 to 65%. The  left ventricle has normal function. The left ventricle has no regional  wall motion abnormalities. There is mild left ventricular hypertrophy.  Left ventricular diastolic parameters  are  indeterminate.   2. Right ventricular systolic function is normal. The right ventricular  size is mildly enlarged. There is mildly elevated pulmonary artery  systolic pressure. The estimated right ventricular systolic pressure is  38.5 mmHg.   3. The mitral valve has been repaired/replaced. There is a Mitra-Clip  present in the mitral position. The average mean gradient is at HR  56bpm. There is moderate mitral regurgitation that appears slightly more  significant compared to prior TTE  best appreciated on SAX views.   4. Left atrial size was severely dilated.   5. An iatrogenic ASD is present with left to  right shunting. Evidence of  atrial level shunting detected by color flow Doppler.   6. Right atrial size was moderately dilated.   7. The aortic valve is tricuspid. There is mild calcification of the  aortic valve. There is mild thickening of the aortic valve. Aortic valve  regurgitation is not visualized. Aortic valve sclerosis/calcification is  present, without any evidence of  aortic stenosis.   8. Aortic dilatation noted. There is mild dilatation of the ascending  aorta, measuring 41 mm.   9. The inferior vena cava is normal in size with greater than 50%  respiratory variability, suggesting right atrial pressure of 3 mmHg.   Patient Profile     82 y.o. female with history of mitral regurgitation s/p mitral valve clip x 3, recently diagnosed Afib in 08/2023, HFpEF, Parkinson's disease, and orthostatic hypotension who we are seeing for Afib with RVR in the context of valvular heart disease and acute on chronic HFpEF.  Assessment & Plan    1. Afib with RVR: -Recently diagnosed in the office on 08/27/2023 -Not well tolerated in the setting of her valvular heart disease -Status post successful TEE guided DCCV on 09/05/2023 -Maintaining sinus rhythm  -Transition from IV amiodarone to oral amiodarone 400 mg bid for 7 days (10/19), then 200 mg bid for 7 days (10/26), then 200  mg daily thereafter -CHADS2VASc at least 5 (CHF, age x 2, vascular disease, sex category) -Eliquis 2.5 mg bid (age and weight) -Maintain potassium goal 4.0, repletion ordered -Check TSH -Plan for outpatient amiodarone surveillance labs  2. Valvular heart disease: -Status post mitral vale clip x 3 -TEE this admission showed mild to moderate mitral regurgitation -Outpatient follow up  3. Acute on chronic HFpEF: -Exacerbated by Afib with RVR and mitral valve disease -Volume improving -Continue IV Lasix to IV Lasix 40 mg daily for today -Escalate GDMT as able, BP precludes at this time  4. Elevated troponin: -Mildly elevated -Likely supply demand ischemia in the setting of Afib with RVR and volume overload -LHC in 2022 showed normal coronary arteries -Echo this admission showed preserved LV systolic function with normal wall motion -No plans for inpatient ischemic testing at this time       For questions or updates, please contact [facility] Please consult [email or web address] for contact info under Cardiology/STEMI.    Signed, Eula Listen, PA-C [facility] Pager: [phone] 09/07/2023, 8:45 AM

## 2023-09-07 NOTE — Progress Notes (Addendum)
Progress Note   Patient: Crystal Bass UJW:[id number] DOB: 11-05-1941 DOA: 09/04/2023     3 DOS: the patient was seen and examined on 09/07/2023      Subjective:  Patient seen and examined at bedside this morning Exhibiting symptoms of dementia Not in any acute distress Amiodarone have been switched to oral dosing today by cardiology    Brief hospital course: From HPI "Crystal Bass is a 82 y.o. female with medical history significant of multiple medical issues including Parkinson's disease, COPD, mitral regurgitation status post mitral clip in 2023 with redo May 2024, chronic HFpEF, history PE, CAD, GERD, hypertension presented with acute respiratory failure with hypoxia, A-fib with RVR, acute on chronic HFpEF, pneumonia, COPD exacerbation, sepsis.  "   Assessment and Plan:   Atrial fibrillation with RVR (HCC) HR into the 140s  Afib w/ RVR on EKG  Patient currently switched to amiodarone to complete 400 milligrams twice daily for 7 days then to 200 twice daily for 7 days then 200 mg daily thereafter Plan of care discussed with cardiologist Continue Xarelto Given persistent A-fib with RVR patient underwent TEE with DC cardioversion on 09/05/2023    Acute respiratory failure with hypoxia has been ruled out, respiratory distress only  Decompensated respiratory failure now requiring 2 L nasal cannula to keep O2 sats greater than 94% in the setting of A-fib with RVR,?  Pneumonia on imaging, decompensated heart failure as well as baseline COPD Positive mild increased work of breathing at baseline IV Solu-Medrol as well as as needed Xopenex for COPD  Left basilar retrocardiac infiltrate on chest x-ray Continue Rocephin azithromycin for infectious coverage Continue current oxygen requirement     Acute on chronic heart failure with preserved ejection fraction (HFpEF) (HCC) Decompensated resp status w/ noted volume overload in setting of afib w/ RVR, COPD, PNA BNP 1700 (well  above baseline)  CXR w/ noted acute vs. Chronic opacities  2+ pitting edema bilaterally  Cardiologist on board and case discussed Follow up recommendations      CAP (community acquired pneumonia) Decompensated resp status requiring 2L [place] with noted ? L retrocardiac PNA in setting of decompensated COPD, CHF and afib w/ RVR  Noted admission September 2024 for similar issue Continue current IV antibiotics IV rocephin and azithromycin for infectious coverage  Continue to follow culture results   Sepsis [facility]) Meeting sepsis criteria by heart rate in the 100s in setting A-fib with RVR as well as respirations into the 30s Noted concurrent A-fib with RVR, pneumonia, acute on chronic HFpEF, COPD exacerbations Continue IV Rocephin azithromycin for pulmonary coverage Follow-up on culture results Lactate and procalcitonin pending We will continue deferring IV fluids in the setting of active HFpEF exacerbation     COPD (chronic obstructive pulmonary disease) (HCC) + decompensated COPD in setting of acute resp failure w/ hypoxia  Wheezing resolved IV solumedrol  Prn xopenex in setting of afib w/ RVR  Continue current antibiotics as above Continue nebulization therapy     Essential hypertension BP stable  Titrate home regimen      S/P mitral valve clip implantation S/p MitraClip's x 2 due to inadequate symptomatic improvement after first MitraClip in 2023    Parkinson disease (HCC) Continue sinemet    Daytime somnolence + chronic somnolence at baseline         Advance Care Planning:   Code Status: Limited: Do not attempt resuscitation (DNR) -DNR-LIMITED -Do Not Intubate/DNI     Consults: Cardiology    Family Communication:  No family at the bedside          Physical Exam: Constitutional: Patient laying in bed no more anxious    Head: Normocephalic.     Nose: Nose normal.     Mouth/Throat:     Mouth: Mucous membranes are moist.  Eyes:     Pupils: Pupils are equal, round,  and reactive to light.  Cardiovascular:     Rate and Rhythm: Tachycardia resolved Abdominal:     General: Bowel sounds are normal.  Musculoskeletal:     Cervical back: Normal range of motion.     Comments: Positive generalized weakness  Skin:    General: Skin is warm.  Neurological:     General: No focal deficit present.     Comments: Positive generalized weakness and fatigue Otherwise grossly nonfocal neuroexam  Psychiatric:        Mood and Affect: Mood normal.    Data Reviewed: I have reviewed patient's vitals, labs, nursing documentation, cardiology documentation, transition of care managers documentation   Disposition: Status is: Inpatient   Time spent: 40 minutes     Latest Ref Rng & Units 09/07/2023    3:13 AM 09/06/2023    3:45 AM 09/05/2023    5:21 AM  CBC  WBC 4.0 - 10.5 K/uL 6.2  5.2  2.5   Hemoglobin 12.0 - 15.0 g/dL 82.9  56.2  13.0   Hematocrit 36.0 - 46.0 % 36.8  36.4  37.3   Platelets 150 - 400 K/uL 140  145  147        Latest Ref Rng & Units 09/07/2023    3:13 AM 09/06/2023    3:45 AM 09/05/2023    5:21 AM  BMP  Glucose 70 - 99 mg/dL 865  784  696   BUN 8 - 23 mg/dL 27  27  20    Creatinine 0.44 - 1.00 mg/dL 2.95  2.84  1.32   Sodium 135 - 145 mmol/L 135  135  134   Potassium 3.5 - 5.1 mmol/L 3.6  4.1  4.2   Chloride 98 - 111 mmol/L 100  100  100   CO2 22 - 32 mmol/L 26  23  22    Calcium 8.9 - 10.3 mg/dL 7.8  8.4  8.8      Vitals:   09/07/23 0000 09/07/23 0500 09/07/23 0800 09/07/23 1338  BP: (!) 135/91  (!) 148/91   Pulse: 63  (!) 31   Resp: (!) 23  (!) 27   Temp:   97.8 F (36.6 C) (!) 97.4 F (36.3 C)  TempSrc: Axillary  Oral Oral  SpO2: 97%  97%   Weight:  57.1 kg    Height:         Author: Loyce Dys, MD 09/07/2023 3:50 PM  For on call review [email or web address].

## 2023-09-07 NOTE — Plan of Care (Signed)
  Problem: Activity: Goal: Ability to tolerate increased activity will improve Outcome: Progressing   Problem: Cardiac: Goal: Ability to achieve and maintain adequate cardiopulmonary perfusion will improve Outcome: Progressing   Problem: Clinical Measurements: Goal: Will remain free from infection Outcome: Progressing   Problem: Activity: Goal: Risk for activity intolerance will decrease Outcome: Progressing   Problem: Coping: Goal: Level of anxiety will decrease Outcome: Progressing   Problem: Safety: Goal: Ability to remain free from injury will improve Outcome: Progressing

## 2023-09-07 NOTE — Plan of Care (Signed)
  Problem: Activity: Goal: Capacity to carry out activities will improve Outcome: Progressing   Problem: Education: Goal: Understanding of medication regimen will improve Outcome: Progressing   Problem: Activity: Goal: Ability to tolerate increased activity will improve Outcome: Progressing   Problem: Cardiac: Goal: Ability to achieve and maintain adequate cardiopulmonary perfusion will improve Outcome: Progressing   Problem: Health Behavior/Discharge Planning: Goal: Ability to safely manage health-related needs after discharge will improve Outcome: Progressing   Problem: Fluid Volume: Goal: Hemodynamic stability will improve Outcome: Progressing   Problem: Respiratory: Goal: Ability to maintain adequate ventilation will improve Outcome: Progressing   Problem: Education: Goal: Knowledge of General Education information will improve Description: Including pain rating scale, medication(s)/side effects and non-pharmacologic comfort measures Outcome: Progressing

## 2023-09-07 NOTE — TOC Progression Note (Signed)
Transition of Care [facility]) - Progression Note    Patient Details  Name: ANEEKA BOWDEN MRN: [id number] Date of Birth: 1940/12/19  Transition of Care [facility]) CM/SW Contact  Bing Quarry, RN Phone Number: 09/07/2023, 4:40 PM  Clinical Narrative:  10/12: Insurance authorization submitted 09/06/23. Remains pending as of 440 pm today.   Gabriel Cirri MSN RN CM  Transitions of Care Department [facility] [phone] Weekends Only    Expected Discharge Plan: Skilled Nursing Facility Barriers to Discharge: Continued Medical Work up  Expected Discharge Plan and Services                                               Social Determinants of Health (SDOH) Interventions SDOH Screenings   Food Insecurity: No Food Insecurity (09/05/2023)  Housing: Low Risk  (09/05/2023)  Transportation Needs: No Transportation Needs (09/05/2023)  Utilities: Not At Risk (09/05/2023)  Alcohol Screen: Low Risk  (08/13/2022)  Depression (PHQ2-9): Low Risk  (12/07/2022)  Financial Resource Strain: Low Risk  (08/13/2022)  Physical Activity: Insufficiently Active (08/13/2022)  Social Connections: Socially Isolated (08/13/2022)  Stress: No Stress Concern Present (08/13/2022)  Tobacco Use: Low Risk  (09/04/2023)    Readmission Risk Interventions     No data to display

## 2023-09-07 NOTE — Progress Notes (Signed)
PHARMACIST - PHYSICIAN COMMUNICATION  CONCERNING: Antibiotic IV to Oral Route Change Policy  RECOMMENDATION: This patient is receiving azithromycin by the intravenous route.  Based on criteria approved by the Pharmacy and Therapeutics Committee, the antibiotic(s) is/are being converted to the equivalent oral dose form(s).  DESCRIPTION: These criteria include: Patient being treated for a respiratory tract infection, urinary tract infection, cellulitis or clostridium difficile associated diarrhea if on metronidazole The patient is not neutropenic and does not exhibit a GI malabsorption state The patient is eating (either orally or via tube) and/or has been taking other orally administered medications for a least 24 hours The patient is improving clinically and has a Tmax < 100.5  If you have questions about this conversion, please contact the Pharmacy Department   Gardner Candle, PharmD, BCPS Clinical Pharmacist 09/07/2023 9:47 AM

## 2023-09-07 NOTE — Progress Notes (Signed)
Patient has tolerated furosemide

## 2023-09-08 DIAGNOSIS — I4891 Unspecified atrial fibrillation: Secondary | ICD-10-CM | POA: Diagnosis not present

## 2023-09-08 LAB — BASIC METABOLIC PANEL
Anion gap: 11 (ref 5–15)
BUN: 23 mg/dL (ref 8–23)
CO2: 26 mmol/L (ref 22–32)
Calcium: 8.3 mg/dL — ABNORMAL LOW (ref 8.9–10.3)
Chloride: 102 mmol/L (ref 98–111)
Creatinine, Ser: 0.78 mg/dL (ref 0.44–1.00)
GFR, Estimated: >60 mL/min (ref >60)
Glucose, Bld: 84 mg/dL (ref 70–99)
Potassium: 4.1 mmol/L (ref 3.5–5.1)
Sodium: 139 mmol/L (ref 135–145)

## 2023-09-08 LAB — CBC WITH DIFFERENTIAL/PLATELET
Abs Immature Granulocytes: 0.04 10*3/uL (ref 0.00–0.07)
Basophils Absolute: 0 10*3/uL (ref 0.0–0.1)
Basophils Relative: 0 %
Eosinophils Absolute: 0.1 10*3/uL (ref 0.0–0.5)
Eosinophils Relative: 1 %
HCT: 38.9 % (ref 36.0–46.0)
Hemoglobin: 12.7 g/dL (ref 12.0–15.0)
Immature Granulocytes: 1 %
Lymphocytes Relative: 13 %
Lymphs Abs: 0.8 10*3/uL (ref 0.7–4.0)
MCH: 32.3 pg (ref 26.0–34.0)
MCHC: 32.6 g/dL (ref 30.0–36.0)
MCV: 99 fL (ref 80.0–100.0)
Monocytes Absolute: 0.8 10*3/uL (ref 0.1–1.0)
Monocytes Relative: 13 %
Neutro Abs: 4.3 10*3/uL (ref 1.7–7.7)
Neutrophils Relative %: 72 %
Platelets: 162 10*3/uL (ref 150–400)
RBC: 3.93 MIL/uL (ref 3.87–5.11)
RDW: 14.8 % (ref 11.5–15.5)
WBC: 6 10*3/uL (ref 4.0–10.5)
nRBC: 0 % (ref 0.0–0.2)

## 2023-09-08 MED ORDER — ORAL CARE MOUTH RINSE: 15.0000 mL | OROMUCOSAL | Status: DC | PRN

## 2023-09-08 NOTE — Progress Notes (Addendum)
Progress Note   Patient: Crystal Bass [id number] DOB: Aug 21, 1941 DOA: 09/04/2023     4 DOS: the patient was seen and examined on 09/08/2023      Subjective:  Patient seen and examined at bedside this morning Denies nausea vomiting abdominal pain or chest pain Has been seen by PT OT who has recommended discharge to skilled nursing facility when medically ready   Brief hospital course: From HPI "Crystal Bass is a 82 y.o. female with medical history significant of multiple medical issues including Parkinson's disease, COPD, mitral regurgitation status post mitral clip in 2023 with redo May 2024, chronic HFpEF, history PE, CAD, GERD, hypertension presented with acute respiratory failure with hypoxia, A-fib with RVR, acute on chronic HFpEF, pneumonia, COPD exacerbation, sepsis.  "   Assessment and Plan:   Atrial fibrillation with RVR (HCC)-improved HR into the 140s  Afib w/ RVR on EKG  Continue amiodarone to complete 400 milligrams twice daily for 7 days then to 200 twice daily for 7 days then 200 mg daily thereafter Plan of care discussed with cardiology Continue Eliquis Given persistent A-fib with RVR patient underwent TEE with DC cardioversion on 09/05/2023     Acute respiratory failure with hypoxia has been ruled out, respiratory distress only  Decompensated respiratory failure now requiring 2 L nasal cannula to keep O2 sats greater than 94% in the setting of A-fib with RVR,?  Pneumonia on imaging, decompensated heart failure as well as baseline COPD Positive mild increased work of breathing at baseline IV Solu-Medrol as well as as needed Xopenex for COPD  Left basilar retrocardiac infiltrate on chest x-ray Continue Rocephin azithromycin for infectious coverage to complete 5 days course Continue current oxygen to maintain appropriate saturation     Acute on chronic heart failure with preserved ejection fraction (HFpEF) (HCC) Decompensated resp status w/ noted volume  overload in setting of afib w/ RVR, COPD, PNA BNP 1700 (well above baseline)  CXR w/ noted acute vs. Chronic opacities  Cardiologist on board and case discussed Lasix have been switched to oral     CAP (community acquired pneumonia) Decompensated resp status requiring 2L [place] with noted ? L retrocardiac PNA in setting of decompensated COPD, CHF and afib w/ RVR  Noted admission September 2024 for similar issue Continue current IV antibiotics IV rocephin and azithromycin for infectious coverage  Continue to follow culture results   Sepsis [facility]) Meeting sepsis criteria by heart rate in the 100s in setting A-fib with RVR as well as respirations into the 30s Noted concurrent A-fib with RVR, pneumonia, acute on chronic HFpEF, COPD exacerbations Continue IV Rocephin azithromycin for pulmonary coverage    COPD (chronic obstructive pulmonary disease) (HCC) + decompensated COPD in setting of acute resp failure w/ hypoxia  Wheezing resolved Has completed 5 days course of steroid therapy Prn xopenex in setting of afib w/ RVR  Continue current antibiotics as above Continue nebulization therapy     Essential hypertension BP stable  Titrate home regimen      S/P mitral valve clip implantation S/p MitraClip's x 2 due to inadequate symptomatic improvement after first MitraClip in 2023    Parkinson disease (HCC) Continue sinemet    Daytime somnolence + chronic somnolence at baseline         Advance Care Planning:   Code Status: Limited: Do not attempt resuscitation (DNR) -DNR-LIMITED -Do Not Intubate/DNI     Consults: Cardiology    Family Communication: Discussed with patient's brother at bedside  Physical Exam: Constitutional: Patient laying in bed no more anxious    Head: Normocephalic.     Nose: Nose normal.     Mouth/Throat:     Mouth: Mucous membranes are moist.  Eyes:     Pupils: Pupils are equal, round, and reactive to light.  Cardiovascular:     Rate and Rhythm:  Tachycardia resolved Abdominal:     General: Bowel sounds are normal.  Musculoskeletal:     Cervical back: Normal range of motion.     Comments: Positive generalized weakness  Skin:    General: Skin is warm.  Neurological:     General: No focal deficit present.     Comments: Positive generalized weakness and fatigue Otherwise grossly nonfocal neuroexam  Psychiatric:        Mood and Affect: Mood normal.    Data Reviewed: I have reviewed patient's vitals, labs and listed below, TOC manager documentation   Disposition: Skilled nursing facility Status is: Inpatient   Time spent: 38 minutes      Latest Ref Rng & Units 09/08/2023    4:17 AM 09/07/2023    3:13 AM 09/06/2023    3:45 AM  CBC  WBC 4.0 - 10.5 K/uL 6.0  6.2  5.2   Hemoglobin 12.0 - 15.0 g/dL 44.0  34.7  42.5   Hematocrit 36.0 - 46.0 % 38.9  36.8  36.4   Platelets 150 - 400 K/uL 162  140  145        Latest Ref Rng & Units 09/08/2023    4:17 AM 09/07/2023    3:13 AM 09/06/2023    3:45 AM  BMP  Glucose 70 - 99 mg/dL 84  956  387   BUN 8 - 23 mg/dL 23  27  27    Creatinine 0.44 - 1.00 mg/dL 5.64  3.32  9.51   Sodium 135 - 145 mmol/L 139  135  135   Potassium 3.5 - 5.1 mmol/L 4.1  3.6  4.1   Chloride 98 - 111 mmol/L 102  100  100   CO2 22 - 32 mmol/L 26  26  23    Calcium 8.9 - 10.3 mg/dL 8.3  7.8  8.4     Vitals:   09/08/23 0400 09/08/23 0423 09/08/23 0730 09/08/23 1145  BP:   (!) 142/94 (!) 134/97  Pulse: 65  (!) 59 67  Resp:   (!) 29 (!) 34  Temp: (!) 97.2 F (36.2 C)     TempSrc: Oral     SpO2: 96%  93% 92%  Weight:  56.1 kg    Height:           Author: Loyce Dys, MD 09/08/2023 2:18 PM  For on call review [email or web address].

## 2023-09-08 NOTE — Plan of Care (Signed)
  Problem: Education: Goal: Ability to demonstrate management of disease process will improve Outcome: Progressing Goal: Ability to verbalize understanding of medication therapies will improve Outcome: Progressing Goal: Individualized Educational Video(s) Outcome: Progressing   Problem: Activity: Goal: Capacity to carry out activities will improve Outcome: Progressing   Problem: Cardiac: Goal: Ability to achieve and maintain adequate cardiopulmonary perfusion will improve Outcome: Progressing   Problem: Education: Goal: Knowledge of disease or condition will improve Outcome: Progressing Goal: Understanding of medication regimen will improve Outcome: Progressing Goal: Individualized Educational Video(s) Outcome: Progressing   Problem: Activity: Goal: Ability to tolerate increased activity will improve Outcome: Progressing   Problem: Cardiac: Goal: Ability to achieve and maintain adequate cardiopulmonary perfusion will improve Outcome: Progressing   Problem: Health Behavior/Discharge Planning: Goal: Ability to safely manage health-related needs after discharge will improve Outcome: Progressing   Problem: Fluid Volume: Goal: Hemodynamic stability will improve Outcome: Progressing   Problem: Clinical Measurements: Goal: Diagnostic test results will improve Outcome: Progressing Goal: Signs and symptoms of infection will decrease Outcome: Progressing   Problem: Respiratory: Goal: Ability to maintain adequate ventilation will improve Outcome: Progressing   Problem: Education: Goal: Knowledge of General Education information will improve Description: Including pain rating scale, medication(s)/side effects and non-pharmacologic comfort measures Outcome: Progressing   Problem: Health Behavior/Discharge Planning: Goal: Ability to manage health-related needs will improve Outcome: Progressing   Problem: Clinical Measurements: Goal: Ability to maintain clinical  measurements within normal limits will improve Outcome: Progressing Goal: Will remain free from infection Outcome: Progressing Goal: Diagnostic test results will improve Outcome: Progressing Goal: Respiratory complications will improve Outcome: Progressing Goal: Cardiovascular complication will be avoided Outcome: Progressing   Problem: Activity: Goal: Risk for activity intolerance will decrease Outcome: Progressing   Problem: Nutrition: Goal: Adequate nutrition will be maintained Outcome: Progressing   Problem: Coping: Goal: Level of anxiety will decrease Outcome: Progressing   Problem: Elimination: Goal: Will not experience complications related to bowel motility Outcome: Progressing Goal: Will not experience complications related to urinary retention Outcome: Progressing   Problem: Pain Managment: Goal: General experience of comfort will improve Outcome: Progressing   Problem: Safety: Goal: Ability to remain free from injury will improve Outcome: Progressing   Problem: Skin Integrity: Goal: Risk for impaired skin integrity will decrease Outcome: Progressing

## 2023-09-09 ENCOUNTER — Encounter: Payer: Self-pay | Admitting: Cardiology

## 2023-09-09 ENCOUNTER — Other Ambulatory Visit: Payer: Self-pay | Admitting: Adult Health

## 2023-09-09 DIAGNOSIS — G20A1 Parkinson's disease without dyskinesia, without mention of fluctuations: Secondary | ICD-10-CM

## 2023-09-09 DIAGNOSIS — I4891 Unspecified atrial fibrillation: Secondary | ICD-10-CM | POA: Diagnosis not present

## 2023-09-09 LAB — CULTURE, BLOOD (ROUTINE X 2)
Culture: NO GROWTH
Culture: NO GROWTH
Special Requests: ADEQUATE
Special Requests: ADEQUATE

## 2023-09-09 MED ORDER — AMIODARONE HCL 200 MG PO TABS: ORAL_TABLET | ORAL | Status: DC

## 2023-09-09 MED ORDER — AMIODARONE HCL 200 MG PO TABS: 200.0000 mg | ORAL_TABLET | Freq: Every day | ORAL | Status: DC

## 2023-09-09 MED ORDER — AMIODARONE HCL 200 MG PO TABS: 200.0000 mg | ORAL_TABLET | Freq: Two times a day (BID) | ORAL | Status: DC

## 2023-09-09 MED ORDER — CLONAZEPAM 0.5 MG PO TABS
ORAL_TABLET | ORAL | 0 refills | Status: DC
Start: 2023-09-09 — End: 2023-10-01

## 2023-09-09 MED ORDER — AMIODARONE HCL 400 MG PO TABS: 400.0000 mg | ORAL_TABLET | Freq: Two times a day (BID) | ORAL | Status: DC

## 2023-09-09 NOTE — Discharge Summary (Signed)
Physician Discharge Summary   Patient: Crystal Bass MRN: [id number] DOB: 06-03-1941  Admit date:     09/04/2023  Discharge date: 09/09/23  Discharge Physician: Loyce Dys   PCP: Eustaquio Boyden, MD   Recommendations at discharge:  Follow-up with cardiologist  Discharge Diagnoses: Atrial fibrillation with RVR (HCC)-improved Acute respiratory failure with hypoxia has been ruled out, respiratory distress only  Acute on chronic heart failure with preserved ejection fraction (HFpEF) (HCC) CAP (community acquired pneumonia) Sepsis (HCC) COPD (chronic obstructive pulmonary disease) (HCC) Essential hypertension S/P mitral valve clip implantation Parkinson disease (HCC) Daytime somnolence  Hospital Course: Crystal Bass is a 82 y.o. female with medical history significant of multiple medical issues including Parkinson's disease, COPD, mitral regurgitation status post mitral clip in 2023 with redo May 2024, chronic HFpEF, history PE, CAD, GERD, hypertension presented with acute respiratory failure with hypoxia, A-fib with RVR, acute on chronic HFpEF, pneumonia, COPD exacerbation, sepsis. Given persistent A-fib with RVR patient underwent TEE with DC cardioversion on 09/05/2023.  Rate has since been controlled and heparin was transitioned to Eliquis given high CHA2DS2-VASc risk score.  Patient was seen by cardiologist and have been cleared for discharge today and to follow-up as an outpatient with GDMT as blood pressure allows at follow-up.  Patient is to continue her outpatient amiodarone. 400 mg twice daily for 7 days (last dose is 09/14/2023) followed by 200 mg twice daily for 7 days(with last dose on 09/21/2023) followed by 200 mg daily thereafter.    Consultants: Cardiology Procedures performed: As mentioned above Disposition: Skilled nursing facility Diet recommendation:  Cardiac diet DISCHARGE MEDICATION: Allergies as of 09/09/2023       Reactions   Sulfa Antibiotics  Anaphylaxis   Sulfonamide Derivatives Anaphylaxis   Topamax [topiramate] Other (See Comments)   Metabolic acidosis    Clarithromycin Other (See Comments)   pericarditis Other reaction(s): Not available   Hydrocodone Other (See Comments)   "hyper and climbing the walls"   Motrin [ibuprofen] Other (See Comments)   headaches   Misc. Sulfonamide Containing Compounds    Other reaction(s): Not available   Symbicort [budesonide-formoterol Fumarate] Other (See Comments)   02/07/15 tremor   Tetracycline Hcl Other (See Comments)   Percocet [oxycodone-acetaminophen] Itching, Rash   Tape Itching, Rash   Use paper tape only   Tetracyclines & Related Other (See Comments)   "immediate yeast infection"        Medication List     STOP taking these medications    ALLEGRA PO   cephALEXin 500 MG capsule Commonly known as: KEFLEX   EPINEPHrine 0.3 mg/0.3 mL Soaj injection Commonly known as: EPI-PEN   gabapentin 300 MG capsule Commonly known as: [name]   metoprolol tartrate 25 MG tablet Commonly known as: LOPRESSOR   potassium chloride SA 20 MEQ tablet Commonly known as: KLOR-CON M   Prednisolon-Moxiflox-Bromfenac 1-0.5-0.075 % Soln   Vitamin D3 25 MCG (1000 UT) Caps       TAKE these medications    acetaminophen 500 MG tablet Commonly known as: TYLENOL Take 1,000 mg by mouth 3 (three) times daily.   albuterol 108 (90 Base) MCG/ACT inhaler Commonly known as: VENTOLIN HFA Inhale 2 puffs into the lungs every 6 (six) hours as needed for wheezing or shortness of breath.   amiodarone 400 MG tablet Commonly known as: PACERONE Take 1 tablet (400 mg total) by mouth 2 (two) times daily for 5 days.   amiodarone 200 MG tablet Commonly known as: PACERONE Take 1  No evidence of pulmonic stenosis. Aorta: The aortic root is normal in size and structure. Venous: The inferior vena cava is normal in size with greater than 50% respiratory variability, suggesting right atrial pressure of 3 mmHg. IAS/Shunts: Evidence of atrial level shunting detected by color flow Doppler. Small ASD shunting (L-->R). lilely from prior septal punture during mitraclip.  MITRAL VALVE             TRICUSPID VALVE MV Peak grad: 14.4 mmHg  TR Peak grad:   24.4 mmHg MV Mean grad: 6.0 mmHg   TR Vmax:        247.00 cm/s MV Vmax:      1.90 m/s MV Vmean:     107.0 cm/s Debbe Odea MD Electronically signed by Debbe Odea MD Signature Date/Time: 09/06/2023/8:40:16 AM    Final    ECHOCARDIOGRAM LIMITED  Result Date: 09/05/2023    ECHOCARDIOGRAM LIMITED REPORT   Patient Name:   Crystal Bass Date of Exam: 09/05/2023 Medical Rec #:  [id number]         Height:       62.0 in Accession #:    [id number]        Weight:       119.0 lb Date of Birth:  01/02/1941         BSA:          1.533 m Patient Age:    82 years          BP:           115/81 mmHg Patient Gender: F                 HR:           104 bpm. Exam Location:  [facility] Procedure: 2D Echo, Limited Echo and Limited Color Doppler Indications:     Atrial Fibrillation  History:         Patient has prior history of Echocardiogram examinations, most                  recent 05/15/2023. CHF, COPD, Arrythmias:Atrial Fibrillation,                  Signs/Symptoms:Dyspnea; Risk Factors:Hypertension and                  Dyslipidemia. Pulmonary embolus, Lt Breast CA, A Mitra clip is                  present in the MV position.  Sonographer:     Mikki Harbor Referring Phys:  [id number] CADENCE H FURTH  Diagnosing Phys: Julien Nordmann MD IMPRESSIONS  1. Left ventricular ejection fraction, by estimation, is 55 to 60%. The left ventricle has normal function. The left ventricle has no regional wall motion abnormalities. There is moderate left ventricular hypertrophy. Left ventricular diastolic parameters are indeterminate.  2. Right ventricular systolic function is normal. The right ventricular size is mildly enlarged. Tricuspid regurgitation signal is inadequate for assessing PA pressure.  3. Left atrial size was moderately dilated.  4. The mitral valve is normal in structure. s/p mitral valve clip. Moderate mitral valve regurgitation. Moderate mitral annular calcification. Degree of mitral valve stenosis was not measured.  5. The aortic valve has been repaired/replaced. Aortic valve regurgitation is not visualized. No aortic stenosis is present.  6. The inferior vena cava is normal in size with greater than 50% respiratory variability, suggesting right atrial pressure of 3 mmHg.  7. There is a mild  tablet (200 mg total) by mouth 2 (two) times daily for 7 days, THEN 1 tablet (200 mg total) daily. Start taking on: September 14, 2023   apixaban 2.5 MG Tabs tablet Commonly known as: ELIQUIS Take 1 tablet (2.5 mg  total) by mouth 2 (two) times daily.   calcium carbonate 500 MG chewable tablet Commonly known as: TUMS - dosed in mg elemental calcium Chew 2 tablets by mouth daily as needed for indigestion or heartburn.   Carbidopa-Levodopa ER 25-100 MG tablet controlled release Commonly known as: SINEMET CR TAKE TWO TABLET BY MOUTH AT NINE IN THE MORNING, ONE TAB AT ONE IN THE EVENING AND ONE TAB AT FIVE IN THE EVENING   clonazePAM 0.5 MG tablet Commonly known as: KLONOPIN TAKE ONE HALF (1/2) A TABLET BY MOUTH AT BEDTIME   furosemide 40 MG tablet Commonly known as: LASIX TAKE ONE TABLET BY MOUTH ONCE DAILY *NEW DOSE*   pantoprazole 20 MG tablet Commonly known as: Protonix Take 1 tablet (20 mg total) by mouth daily.   Trelegy Ellipta 100-62.5-25 MCG/ACT Aepb Generic drug: Fluticasone-Umeclidin-Vilant Inhale 1 puff into the lungs daily.        Follow-up Information     End, Cristal Deer, MD. Schedule an appointment as soon as possible for a visit in 1 week(s).   Specialty: Cardiology Contact information: [address] [place] [address] [phone]                Discharge Exam: Ceasar Mons Weights   09/07/23 0500 09/08/23 0423 09/09/23 0708  Weight: 57.1 kg 56.1 kg 52.5 kg   Constitutional: Patient laying in bed no more anxious    Head: Normocephalic.     Nose: Nose normal.     Mouth/Throat:     Mouth: Mucous membranes are moist.  Eyes:     Pupils: Pupils are equal, round, and reactive to light.  Cardiovascular:     Rate and Rhythm: Tachycardia resolved Abdominal:     General: Bowel sounds are normal.  Musculoskeletal:     Cervical back: Normal range of motion.     Comments: Positive generalized weakness  Skin:    General: Skin is warm.  Neurological:     General: No focal deficit present.     Comments: Positive generalized weakness and fatigue Otherwise grossly nonfocal neuroexam  Psychiatric:        Mood and Affect: Mood normal.         Condition at discharge: good  The results of significant diagnostics from this hospitalization (including imaging, microbiology, ancillary and laboratory) are listed below for reference.   Imaging Studies: ECHO TEE  Result Date: 09/06/2023    TRANSESOPHOGEAL ECHO REPORT   Patient Name:   Crystal Bass Date of Exam: 09/05/2023 Medical Rec #:  [id number]         Height:       62.0 in Accession #:    [id number]        Weight:       119.0 lb Date of Birth:  1941/10/19         BSA:          1.533 m Patient Age:    82 years          BP:           115/81 mmHg Patient Gender: F                 HR:  tablet (200 mg total) by mouth 2 (two) times daily for 7 days, THEN 1 tablet (200 mg total) daily. Start taking on: September 14, 2023   apixaban 2.5 MG Tabs tablet Commonly known as: ELIQUIS Take 1 tablet (2.5 mg  total) by mouth 2 (two) times daily.   calcium carbonate 500 MG chewable tablet Commonly known as: TUMS - dosed in mg elemental calcium Chew 2 tablets by mouth daily as needed for indigestion or heartburn.   Carbidopa-Levodopa ER 25-100 MG tablet controlled release Commonly known as: SINEMET CR TAKE TWO TABLET BY MOUTH AT NINE IN THE MORNING, ONE TAB AT ONE IN THE EVENING AND ONE TAB AT FIVE IN THE EVENING   clonazePAM 0.5 MG tablet Commonly known as: KLONOPIN TAKE ONE HALF (1/2) A TABLET BY MOUTH AT BEDTIME   furosemide 40 MG tablet Commonly known as: LASIX TAKE ONE TABLET BY MOUTH ONCE DAILY *NEW DOSE*   pantoprazole 20 MG tablet Commonly known as: Protonix Take 1 tablet (20 mg total) by mouth daily.   Trelegy Ellipta 100-62.5-25 MCG/ACT Aepb Generic drug: Fluticasone-Umeclidin-Vilant Inhale 1 puff into the lungs daily.        Follow-up Information     End, Cristal Deer, MD. Schedule an appointment as soon as possible for a visit in 1 week(s).   Specialty: Cardiology Contact information: [address] [place] [address] [phone]                Discharge Exam: Ceasar Mons Weights   09/07/23 0500 09/08/23 0423 09/09/23 0708  Weight: 57.1 kg 56.1 kg 52.5 kg   Constitutional: Patient laying in bed no more anxious    Head: Normocephalic.     Nose: Nose normal.     Mouth/Throat:     Mouth: Mucous membranes are moist.  Eyes:     Pupils: Pupils are equal, round, and reactive to light.  Cardiovascular:     Rate and Rhythm: Tachycardia resolved Abdominal:     General: Bowel sounds are normal.  Musculoskeletal:     Cervical back: Normal range of motion.     Comments: Positive generalized weakness  Skin:    General: Skin is warm.  Neurological:     General: No focal deficit present.     Comments: Positive generalized weakness and fatigue Otherwise grossly nonfocal neuroexam  Psychiatric:        Mood and Affect: Mood normal.         Condition at discharge: good  The results of significant diagnostics from this hospitalization (including imaging, microbiology, ancillary and laboratory) are listed below for reference.   Imaging Studies: ECHO TEE  Result Date: 09/06/2023    TRANSESOPHOGEAL ECHO REPORT   Patient Name:   Crystal Bass Date of Exam: 09/05/2023 Medical Rec #:  [id number]         Height:       62.0 in Accession #:    [id number]        Weight:       119.0 lb Date of Birth:  1941/10/19         BSA:          1.533 m Patient Age:    82 years          BP:           115/81 mmHg Patient Gender: F                 HR:  Physician Discharge Summary   Patient: Crystal Bass MRN: [id number] DOB: 06-03-1941  Admit date:     09/04/2023  Discharge date: 09/09/23  Discharge Physician: Loyce Dys   PCP: Eustaquio Boyden, MD   Recommendations at discharge:  Follow-up with cardiologist  Discharge Diagnoses: Atrial fibrillation with RVR (HCC)-improved Acute respiratory failure with hypoxia has been ruled out, respiratory distress only  Acute on chronic heart failure with preserved ejection fraction (HFpEF) (HCC) CAP (community acquired pneumonia) Sepsis (HCC) COPD (chronic obstructive pulmonary disease) (HCC) Essential hypertension S/P mitral valve clip implantation Parkinson disease (HCC) Daytime somnolence  Hospital Course: Crystal Bass is a 82 y.o. female with medical history significant of multiple medical issues including Parkinson's disease, COPD, mitral regurgitation status post mitral clip in 2023 with redo May 2024, chronic HFpEF, history PE, CAD, GERD, hypertension presented with acute respiratory failure with hypoxia, A-fib with RVR, acute on chronic HFpEF, pneumonia, COPD exacerbation, sepsis. Given persistent A-fib with RVR patient underwent TEE with DC cardioversion on 09/05/2023.  Rate has since been controlled and heparin was transitioned to Eliquis given high CHA2DS2-VASc risk score.  Patient was seen by cardiologist and have been cleared for discharge today and to follow-up as an outpatient with GDMT as blood pressure allows at follow-up.  Patient is to continue her outpatient amiodarone. 400 mg twice daily for 7 days (last dose is 09/14/2023) followed by 200 mg twice daily for 7 days(with last dose on 09/21/2023) followed by 200 mg daily thereafter.    Consultants: Cardiology Procedures performed: As mentioned above Disposition: Skilled nursing facility Diet recommendation:  Cardiac diet DISCHARGE MEDICATION: Allergies as of 09/09/2023       Reactions   Sulfa Antibiotics  Anaphylaxis   Sulfonamide Derivatives Anaphylaxis   Topamax [topiramate] Other (See Comments)   Metabolic acidosis    Clarithromycin Other (See Comments)   pericarditis Other reaction(s): Not available   Hydrocodone Other (See Comments)   "hyper and climbing the walls"   Motrin [ibuprofen] Other (See Comments)   headaches   Misc. Sulfonamide Containing Compounds    Other reaction(s): Not available   Symbicort [budesonide-formoterol Fumarate] Other (See Comments)   02/07/15 tremor   Tetracycline Hcl Other (See Comments)   Percocet [oxycodone-acetaminophen] Itching, Rash   Tape Itching, Rash   Use paper tape only   Tetracyclines & Related Other (See Comments)   "immediate yeast infection"        Medication List     STOP taking these medications    ALLEGRA PO   cephALEXin 500 MG capsule Commonly known as: KEFLEX   EPINEPHrine 0.3 mg/0.3 mL Soaj injection Commonly known as: EPI-PEN   gabapentin 300 MG capsule Commonly known as: [name]   metoprolol tartrate 25 MG tablet Commonly known as: LOPRESSOR   potassium chloride SA 20 MEQ tablet Commonly known as: KLOR-CON M   Prednisolon-Moxiflox-Bromfenac 1-0.5-0.075 % Soln   Vitamin D3 25 MCG (1000 UT) Caps       TAKE these medications    acetaminophen 500 MG tablet Commonly known as: TYLENOL Take 1,000 mg by mouth 3 (three) times daily.   albuterol 108 (90 Base) MCG/ACT inhaler Commonly known as: VENTOLIN HFA Inhale 2 puffs into the lungs every 6 (six) hours as needed for wheezing or shortness of breath.   amiodarone 400 MG tablet Commonly known as: PACERONE Take 1 tablet (400 mg total) by mouth 2 (two) times daily for 5 days.   amiodarone 200 MG tablet Commonly known as: PACERONE Take 1  tablet (200 mg total) by mouth 2 (two) times daily for 7 days, THEN 1 tablet (200 mg total) daily. Start taking on: September 14, 2023   apixaban 2.5 MG Tabs tablet Commonly known as: ELIQUIS Take 1 tablet (2.5 mg  total) by mouth 2 (two) times daily.   calcium carbonate 500 MG chewable tablet Commonly known as: TUMS - dosed in mg elemental calcium Chew 2 tablets by mouth daily as needed for indigestion or heartburn.   Carbidopa-Levodopa ER 25-100 MG tablet controlled release Commonly known as: SINEMET CR TAKE TWO TABLET BY MOUTH AT NINE IN THE MORNING, ONE TAB AT ONE IN THE EVENING AND ONE TAB AT FIVE IN THE EVENING   clonazePAM 0.5 MG tablet Commonly known as: KLONOPIN TAKE ONE HALF (1/2) A TABLET BY MOUTH AT BEDTIME   furosemide 40 MG tablet Commonly known as: LASIX TAKE ONE TABLET BY MOUTH ONCE DAILY *NEW DOSE*   pantoprazole 20 MG tablet Commonly known as: Protonix Take 1 tablet (20 mg total) by mouth daily.   Trelegy Ellipta 100-62.5-25 MCG/ACT Aepb Generic drug: Fluticasone-Umeclidin-Vilant Inhale 1 puff into the lungs daily.        Follow-up Information     End, Cristal Deer, MD. Schedule an appointment as soon as possible for a visit in 1 week(s).   Specialty: Cardiology Contact information: [address] [place] [address] [phone]                Discharge Exam: Ceasar Mons Weights   09/07/23 0500 09/08/23 0423 09/09/23 0708  Weight: 57.1 kg 56.1 kg 52.5 kg   Constitutional: Patient laying in bed no more anxious    Head: Normocephalic.     Nose: Nose normal.     Mouth/Throat:     Mouth: Mucous membranes are moist.  Eyes:     Pupils: Pupils are equal, round, and reactive to light.  Cardiovascular:     Rate and Rhythm: Tachycardia resolved Abdominal:     General: Bowel sounds are normal.  Musculoskeletal:     Cervical back: Normal range of motion.     Comments: Positive generalized weakness  Skin:    General: Skin is warm.  Neurological:     General: No focal deficit present.     Comments: Positive generalized weakness and fatigue Otherwise grossly nonfocal neuroexam  Psychiatric:        Mood and Affect: Mood normal.         Condition at discharge: good  The results of significant diagnostics from this hospitalization (including imaging, microbiology, ancillary and laboratory) are listed below for reference.   Imaging Studies: ECHO TEE  Result Date: 09/06/2023    TRANSESOPHOGEAL ECHO REPORT   Patient Name:   Crystal Bass Date of Exam: 09/05/2023 Medical Rec #:  [id number]         Height:       62.0 in Accession #:    [id number]        Weight:       119.0 lb Date of Birth:  1941/10/19         BSA:          1.533 m Patient Age:    82 years          BP:           115/81 mmHg Patient Gender: F                 HR:  No evidence of pulmonic stenosis. Aorta: The aortic root is normal in size and structure. Venous: The inferior vena cava is normal in size with greater than 50% respiratory variability, suggesting right atrial pressure of 3 mmHg. IAS/Shunts: Evidence of atrial level shunting detected by color flow Doppler. Small ASD shunting (L-->R). lilely from prior septal punture during mitraclip.  MITRAL VALVE             TRICUSPID VALVE MV Peak grad: 14.4 mmHg  TR Peak grad:   24.4 mmHg MV Mean grad: 6.0 mmHg   TR Vmax:        247.00 cm/s MV Vmax:      1.90 m/s MV Vmean:     107.0 cm/s Debbe Odea MD Electronically signed by Debbe Odea MD Signature Date/Time: 09/06/2023/8:40:16 AM    Final    ECHOCARDIOGRAM LIMITED  Result Date: 09/05/2023    ECHOCARDIOGRAM LIMITED REPORT   Patient Name:   Crystal Bass Date of Exam: 09/05/2023 Medical Rec #:  [id number]         Height:       62.0 in Accession #:    [id number]        Weight:       119.0 lb Date of Birth:  01/02/1941         BSA:          1.533 m Patient Age:    82 years          BP:           115/81 mmHg Patient Gender: F                 HR:           104 bpm. Exam Location:  [facility] Procedure: 2D Echo, Limited Echo and Limited Color Doppler Indications:     Atrial Fibrillation  History:         Patient has prior history of Echocardiogram examinations, most                  recent 05/15/2023. CHF, COPD, Arrythmias:Atrial Fibrillation,                  Signs/Symptoms:Dyspnea; Risk Factors:Hypertension and                  Dyslipidemia. Pulmonary embolus, Lt Breast CA, A Mitra clip is                  present in the MV position.  Sonographer:     Mikki Harbor Referring Phys:  [id number] CADENCE H FURTH  Diagnosing Phys: Julien Nordmann MD IMPRESSIONS  1. Left ventricular ejection fraction, by estimation, is 55 to 60%. The left ventricle has normal function. The left ventricle has no regional wall motion abnormalities. There is moderate left ventricular hypertrophy. Left ventricular diastolic parameters are indeterminate.  2. Right ventricular systolic function is normal. The right ventricular size is mildly enlarged. Tricuspid regurgitation signal is inadequate for assessing PA pressure.  3. Left atrial size was moderately dilated.  4. The mitral valve is normal in structure. s/p mitral valve clip. Moderate mitral valve regurgitation. Moderate mitral annular calcification. Degree of mitral valve stenosis was not measured.  5. The aortic valve has been repaired/replaced. Aortic valve regurgitation is not visualized. No aortic stenosis is present.  6. The inferior vena cava is normal in size with greater than 50% respiratory variability, suggesting right atrial pressure of 3 mmHg.  7. There is a mild

## 2023-09-09 NOTE — TOC Transition Note (Signed)
Transition of Care [facility]) - CM/SW Discharge Note   Patient Details  Name: Crystal Bass MRN: [id number] Date of Birth: 29-Jan-1941  Transition of Care [facility]) CM/SW Contact:  Darolyn Rua, LCSW Phone Number: 09/09/2023, 2:09 PM   Clinical Narrative:     Patient will DC to: [facility] Anticipated DC date: 10/14 Transport by: [facility]  Per MD patient ready for DC to [facility] [place]. RN, patient, patient's family, and facility notified of DC. Discharge Summary sent to facility. RN given number for report [phone]  . DC packet on chart. Ambulance transport requested for patient.  CSW signing off.     Final next level of care: Skilled Nursing Facility Barriers to Discharge: No Barriers Identified   Patient Goals and CMS Choice CMS Medicare.gov Compare Post Acute Care list provided to:: Patient Choice offered to / list presented to : Patient  Discharge Placement                         Discharge Plan and Services Additional resources added to the After Visit Summary for                                       Social Determinants of Health (SDOH) Interventions SDOH Screenings   Food Insecurity: No Food Insecurity (09/05/2023)  Housing: Low Risk  (09/05/2023)  Transportation Needs: No Transportation Needs (09/05/2023)  Utilities: Not At Risk (09/05/2023)  Alcohol Screen: Low Risk  (08/13/2022)  Depression (PHQ2-9): Low Risk  (12/07/2022)  Financial Resource Strain: Low Risk  (08/13/2022)  Physical Activity: Insufficiently Active (08/13/2022)  Social Connections: Socially Isolated (08/13/2022)  Stress: No Stress Concern Present (08/13/2022)  Tobacco Use: Low Risk  (09/04/2023)     Readmission Risk Interventions     No data to display

## 2023-09-09 NOTE — TOC Progression Note (Signed)
Transition of Care [facility]) - Progression Note    Patient Details  Name: Crystal Bass MRN: [id number] Date of Birth: 05/11/1941  Transition of Care [facility]) CM/SW Contact  Darolyn Rua, [place] Phone Number: 09/09/2023, 10:00 AM  Clinical Narrative:     CSW notes that insurance had requested additional clinicals evening of 10/11, CSW has uploaded and sent additional requested clinicals to insurance this morning.   Pending insurance auth for [facility] SN[facility] at this time.   Expected Discharge Plan: Skilled Nursing Facility Barriers to Discharge: Continued Medical Work up  Expected Discharge Plan and Services                                               Social Determinants of Health (SDOH) Interventions SDOH Screenings   Food Insecurity: No Food Insecurity (09/05/2023)  Housing: Low Risk  (09/05/2023)  Transportation Needs: No Transportation Needs (09/05/2023)  Utilities: Not At Risk (09/05/2023)  Alcohol Screen: Low Risk  (08/13/2022)  Depression (PHQ2-9): Low Risk  (12/07/2022)  Financial Resource Strain: Low Risk  (08/13/2022)  Physical Activity: Insufficiently Active (08/13/2022)  Social Connections: Socially Isolated (08/13/2022)  Stress: No Stress Concern Present (08/13/2022)  Tobacco Use: Low Risk  (09/04/2023)    Readmission Risk Interventions     No data to display

## 2023-09-09 NOTE — Discharge Summary (Signed)
tablet (200 mg total) daily. Start taking on: September 14, 2023   apixaban 2.5 MG Tabs tablet Commonly known as: ELIQUIS Take 1 tablet (2.5 mg total) by mouth 2 (two) times daily.   calcium carbonate 500 MG chewable tablet Commonly known as: TUMS - dosed in mg  elemental calcium Chew 2 tablets by mouth daily as needed for indigestion or heartburn.   Carbidopa-Levodopa ER 25-100 MG tablet controlled release Commonly known as: SINEMET CR TAKE TWO TABLET BY MOUTH AT NINE IN THE MORNING, ONE TAB AT ONE IN THE EVENING AND ONE TAB AT FIVE IN THE EVENING   clonazePAM 0.5 MG tablet Commonly known as: KLONOPIN TAKE ONE HALF (1/2) A TABLET BY MOUTH AT BEDTIME   EPINEPHrine 0.3 mg/0.3 mL Soaj injection Commonly known as: EPI-PEN Inject 0.3 mg into the muscle as needed for anaphylaxis.   furosemide 40 MG tablet Commonly known as: LASIX TAKE ONE TABLET BY MOUTH ONCE DAILY *NEW DOSE*   gabapentin 300 MG capsule Commonly known as: [name] TAKE TWO CAPSULES BY MOUTH THREE TIMES DAILY   pantoprazole 20 MG tablet Commonly known as: Protonix Take 1 tablet (20 mg total) by mouth daily.   Trelegy Ellipta 100-62.5-25 MCG/ACT Aepb Generic drug: Fluticasone-Umeclidin-Vilant Inhale 1 puff into the lungs daily.   Vitamin D3 25 MCG (1000 UT) Caps Take 1 capsule (1,000 Units total) by mouth daily.        Follow-up Information     End, Cristal Deer, MD. Schedule an appointment as soon as possible for a visit in 1 week(s).   Specialty: Cardiology Contact information: [address] [place] [address] [phone]                Discharge Exam: Ceasar Mons Weights   09/07/23 0500 09/08/23 0423 09/09/23 0708  Weight: 57.1 kg 56.1 kg 52.5 kg   Constitutional: Patient laying in bed no more anxious    Head: Normocephalic.     Nose: Nose normal.     Mouth/Throat:     Mouth: Mucous membranes are moist.  Eyes:     Pupils: Pupils are equal, round, and reactive to light.  Cardiovascular:     Rate and Rhythm: Tachycardia resolved Abdominal:     General: Bowel sounds are normal.  Musculoskeletal:     Cervical back: Normal range of motion.     Comments: Positive generalized weakness  Skin:    General: Skin is warm.  Neurological:      General: No focal deficit present.     Comments: Positive generalized weakness and fatigue Otherwise grossly nonfocal neuroexam  Psychiatric:        Mood and Affect: Mood normal.     Condition at discharge: good  The results of significant diagnostics from this hospitalization (including imaging, microbiology, ancillary and laboratory) are listed below for reference.   Imaging Studies: ECHO TEE  Result Date: 09/06/2023    TRANSESOPHOGEAL ECHO REPORT   Patient Name:   Crystal Bass Date of Exam: 09/05/2023 Medical Rec #:  [id number]         Height:       62.0 in Accession #:    [id number]        Weight:       119.0 lb Date of Birth:  1941-07-13         BSA:          1.533 m Patient Age:    49 years  Physician Discharge Summary   Patient: Crystal Bass MRN: [id number] DOB: [birth year]  Admit date:     09/04/2023  Discharge date: 09/09/23  Discharge Physician: Loyce Dys   PCP: Eustaquio Boyden, MD    Recommendations at discharge:  Follow-up with cardiologist   Discharge Diagnoses: Atrial fibrillation with RVR (HCC)-improved Acute respiratory failure with hypoxia has been ruled out, respiratory distress only  Acute on chronic heart failure with preserved ejection fraction (HFpEF) (HCC) CAP (community acquired pneumonia) Sepsis (HCC) COPD (chronic obstructive pulmonary disease) (HCC) Essential hypertension S/P mitral valve clip implantation Parkinson disease (HCC) Daytime somnolence   Hospital Course: Crystal Bass is a 82 y.o. female with medical history significant of multiple medical issues including Parkinson's disease, COPD, mitral regurgitation status post mitral clip in 2023 with redo May 2024, chronic HFpEF, history PE, CAD, GERD, hypertension presented with acute respiratory failure with hypoxia, A-fib with RVR, acute on chronic HFpEF, pneumonia, COPD exacerbation, sepsis. Given persistent A-fib with RVR patient underwent TEE with DC cardioversion on 09/05/2023.  Rate has since been controlled and heparin was transitioned to Eliquis given high CHA2DS2-VASc risk score.  Patient was seen by cardiologist and have been cleared for discharge today and to follow-up as an outpatient with GDMT as blood pressure allows at follow-up.  Patient is to continue her outpatient amiodarone. 400 mg twice daily for 7 days (last dose is 09/14/2023) followed by 200 mg twice daily for 7 days(with last dose on 09/21/2023) followed by 200 mg daily thereafter.      Consultants: Cardiology Procedures performed: As mentioned above Disposition: Skilled nursing facility Diet recommendation:  Cardiac diet DISCHARGE MEDICATION:   Allergies as of 09/09/2023       Reactions   Sulfa  Antibiotics Anaphylaxis   Sulfonamide Derivatives Anaphylaxis   Topamax [topiramate] Other (See Comments)   Metabolic acidosis    Clarithromycin Other (See Comments)   pericarditis Other reaction(s): Not available   Hydrocodone Other (See Comments)   "hyper and climbing the walls"   Motrin [ibuprofen] Other (See Comments)   headaches   Misc. Sulfonamide Containing Compounds    Other reaction(s): Not available   Symbicort [budesonide-formoterol Fumarate] Other (See Comments)   02/07/15 tremor   Tetracycline Hcl Other (See Comments)   Percocet [oxycodone-acetaminophen] Itching, Rash   Tape Itching, Rash   Use paper tape only   Tetracyclines & Related Other (See Comments)   "immediate yeast infection"        Medication List     STOP taking these medications    cephALEXin 500 MG capsule Commonly known as: KEFLEX   metoprolol tartrate 25 MG tablet Commonly known as: LOPRESSOR   potassium chloride SA 20 MEQ tablet Commonly known as: KLOR-CON M   Prednisolon-Moxiflox-Bromfenac 1-0.5-0.075 % Soln       TAKE these medications    acetaminophen 500 MG tablet Commonly known as: TYLENOL Take 1,000 mg by mouth 3 (three) times daily.   albuterol 108 (90 Base) MCG/ACT inhaler Commonly known as: VENTOLIN HFA Inhale 2 puffs into the lungs every 6 (six) hours as needed for wheezing or shortness of breath.   ALLEGRA PO Take 180 mg by mouth daily at 8 pm.   amiodarone 400 MG tablet Commonly known as: PACERONE Take 1 tablet (400 mg total) by mouth 2 (two) times daily for 5 days.   amiodarone 200 MG tablet Commonly known as: PACERONE Take 1 tablet (200 mg total) by mouth 2 (two) times daily for 7 days, THEN 1  tablet (200 mg total) daily. Start taking on: September 14, 2023   apixaban 2.5 MG Tabs tablet Commonly known as: ELIQUIS Take 1 tablet (2.5 mg total) by mouth 2 (two) times daily.   calcium carbonate 500 MG chewable tablet Commonly known as: TUMS - dosed in mg  elemental calcium Chew 2 tablets by mouth daily as needed for indigestion or heartburn.   Carbidopa-Levodopa ER 25-100 MG tablet controlled release Commonly known as: SINEMET CR TAKE TWO TABLET BY MOUTH AT NINE IN THE MORNING, ONE TAB AT ONE IN THE EVENING AND ONE TAB AT FIVE IN THE EVENING   clonazePAM 0.5 MG tablet Commonly known as: KLONOPIN TAKE ONE HALF (1/2) A TABLET BY MOUTH AT BEDTIME   EPINEPHrine 0.3 mg/0.3 mL Soaj injection Commonly known as: EPI-PEN Inject 0.3 mg into the muscle as needed for anaphylaxis.   furosemide 40 MG tablet Commonly known as: LASIX TAKE ONE TABLET BY MOUTH ONCE DAILY *NEW DOSE*   gabapentin 300 MG capsule Commonly known as: [name] TAKE TWO CAPSULES BY MOUTH THREE TIMES DAILY   pantoprazole 20 MG tablet Commonly known as: Protonix Take 1 tablet (20 mg total) by mouth daily.   Trelegy Ellipta 100-62.5-25 MCG/ACT Aepb Generic drug: Fluticasone-Umeclidin-Vilant Inhale 1 puff into the lungs daily.   Vitamin D3 25 MCG (1000 UT) Caps Take 1 capsule (1,000 Units total) by mouth daily.        Follow-up Information     End, Cristal Deer, MD. Schedule an appointment as soon as possible for a visit in 1 week(s).   Specialty: Cardiology Contact information: [address] [place] [address] [phone]                Discharge Exam: Ceasar Mons Weights   09/07/23 0500 09/08/23 0423 09/09/23 0708  Weight: 57.1 kg 56.1 kg 52.5 kg   Constitutional: Patient laying in bed no more anxious    Head: Normocephalic.     Nose: Nose normal.     Mouth/Throat:     Mouth: Mucous membranes are moist.  Eyes:     Pupils: Pupils are equal, round, and reactive to light.  Cardiovascular:     Rate and Rhythm: Tachycardia resolved Abdominal:     General: Bowel sounds are normal.  Musculoskeletal:     Cervical back: Normal range of motion.     Comments: Positive generalized weakness  Skin:    General: Skin is warm.  Neurological:      General: No focal deficit present.     Comments: Positive generalized weakness and fatigue Otherwise grossly nonfocal neuroexam  Psychiatric:        Mood and Affect: Mood normal.     Condition at discharge: good  The results of significant diagnostics from this hospitalization (including imaging, microbiology, ancillary and laboratory) are listed below for reference.   Imaging Studies: ECHO TEE  Result Date: 09/06/2023    TRANSESOPHOGEAL ECHO REPORT   Patient Name:   Crystal Bass Date of Exam: 09/05/2023 Medical Rec #:  [id number]         Height:       62.0 in Accession #:    [id number]        Weight:       119.0 lb Date of Birth:  1941-07-13         BSA:          1.533 m Patient Age:    49 years  Physician Discharge Summary   Patient: Crystal Bass MRN: [id number] DOB: [birth year]  Admit date:     09/04/2023  Discharge date: 09/09/23  Discharge Physician: Loyce Dys   PCP: Eustaquio Boyden, MD    Recommendations at discharge:  Follow-up with cardiologist   Discharge Diagnoses: Atrial fibrillation with RVR (HCC)-improved Acute respiratory failure with hypoxia has been ruled out, respiratory distress only  Acute on chronic heart failure with preserved ejection fraction (HFpEF) (HCC) CAP (community acquired pneumonia) Sepsis (HCC) COPD (chronic obstructive pulmonary disease) (HCC) Essential hypertension S/P mitral valve clip implantation Parkinson disease (HCC) Daytime somnolence   Hospital Course: Crystal Bass is a 82 y.o. female with medical history significant of multiple medical issues including Parkinson's disease, COPD, mitral regurgitation status post mitral clip in 2023 with redo May 2024, chronic HFpEF, history PE, CAD, GERD, hypertension presented with acute respiratory failure with hypoxia, A-fib with RVR, acute on chronic HFpEF, pneumonia, COPD exacerbation, sepsis. Given persistent A-fib with RVR patient underwent TEE with DC cardioversion on 09/05/2023.  Rate has since been controlled and heparin was transitioned to Eliquis given high CHA2DS2-VASc risk score.  Patient was seen by cardiologist and have been cleared for discharge today and to follow-up as an outpatient with GDMT as blood pressure allows at follow-up.  Patient is to continue her outpatient amiodarone. 400 mg twice daily for 7 days (last dose is 09/14/2023) followed by 200 mg twice daily for 7 days(with last dose on 09/21/2023) followed by 200 mg daily thereafter.      Consultants: Cardiology Procedures performed: As mentioned above Disposition: Skilled nursing facility Diet recommendation:  Cardiac diet DISCHARGE MEDICATION:   Allergies as of 09/09/2023       Reactions   Sulfa  Antibiotics Anaphylaxis   Sulfonamide Derivatives Anaphylaxis   Topamax [topiramate] Other (See Comments)   Metabolic acidosis    Clarithromycin Other (See Comments)   pericarditis Other reaction(s): Not available   Hydrocodone Other (See Comments)   "hyper and climbing the walls"   Motrin [ibuprofen] Other (See Comments)   headaches   Misc. Sulfonamide Containing Compounds    Other reaction(s): Not available   Symbicort [budesonide-formoterol Fumarate] Other (See Comments)   02/07/15 tremor   Tetracycline Hcl Other (See Comments)   Percocet [oxycodone-acetaminophen] Itching, Rash   Tape Itching, Rash   Use paper tape only   Tetracyclines & Related Other (See Comments)   "immediate yeast infection"        Medication List     STOP taking these medications    cephALEXin 500 MG capsule Commonly known as: KEFLEX   metoprolol tartrate 25 MG tablet Commonly known as: LOPRESSOR   potassium chloride SA 20 MEQ tablet Commonly known as: KLOR-CON M   Prednisolon-Moxiflox-Bromfenac 1-0.5-0.075 % Soln       TAKE these medications    acetaminophen 500 MG tablet Commonly known as: TYLENOL Take 1,000 mg by mouth 3 (three) times daily.   albuterol 108 (90 Base) MCG/ACT inhaler Commonly known as: VENTOLIN HFA Inhale 2 puffs into the lungs every 6 (six) hours as needed for wheezing or shortness of breath.   ALLEGRA PO Take 180 mg by mouth daily at 8 pm.   amiodarone 400 MG tablet Commonly known as: PACERONE Take 1 tablet (400 mg total) by mouth 2 (two) times daily for 5 days.   amiodarone 200 MG tablet Commonly known as: PACERONE Take 1 tablet (200 mg total) by mouth 2 (two) times daily for 7 days, THEN 1  valve regurgitation is mild to moderate. No evidence of tricuspid stenosis. Aortic Valve: The aortic valve is tricuspid. Aortic valve regurgitation is mild. No aortic stenosis is present. Pulmonic Valve: The pulmonic valve was normal in structure. Pulmonic valve regurgitation is not visualized. No evidence of pulmonic stenosis. Aorta: The aortic root is normal in size and structure. Venous: The inferior vena cava is normal in size with greater than 50% respiratory variability, suggesting right atrial pressure of 3 mmHg. IAS/Shunts: Evidence of atrial level shunting detected by color flow Doppler. Small ASD shunting (L-->R). lilely from prior septal punture during mitraclip.  MITRAL VALVE             TRICUSPID VALVE MV Peak grad: 14.4 mmHg  TR Peak grad:   24.4 mmHg MV Mean grad: 6.0 mmHg   TR Vmax:        247.00 cm/s MV Vmax:      1.90 m/s MV Vmean:     107.0 cm/s Debbe Odea MD Electronically signed by Debbe Odea MD Signature Date/Time: 09/06/2023/8:40:16 AM    Final    ECHOCARDIOGRAM LIMITED  Result Date: 09/05/2023    ECHOCARDIOGRAM LIMITED REPORT   Patient Name:   Crystal Bass Date of Exam: 09/05/2023 Medical Rec #:  [id number]         Height:       62.0 in Accession #:    [id number]        Weight:       119.0 lb Date of Birth:  09/05/1941         BSA:          1.533 m Patient Age:    82 years          BP:           115/81 mmHg Patient Gender: F                 HR:           104 bpm. Exam Location:  [facility] Procedure: 2D Echo, Limited Echo and Limited Color Doppler Indications:     Atrial Fibrillation  History:         Patient has prior history of Echocardiogram examinations, most                  recent 05/15/2023. CHF, COPD, Arrythmias:Atrial Fibrillation,                  Signs/Symptoms:Dyspnea; Risk Factors:Hypertension and                   Dyslipidemia. Pulmonary embolus, Lt Breast CA, A Mitra clip is                  present in the MV position.  Sonographer:     Mikki Harbor Referring Phys:  [id number] CADENCE H FURTH Diagnosing Phys: Julien Nordmann MD IMPRESSIONS  1. Left ventricular ejection fraction, by estimation, is 55 to 60%. The left ventricle has normal function. The left ventricle has no regional wall motion abnormalities. There is moderate left ventricular hypertrophy. Left ventricular diastolic parameters are indeterminate.  2. Right ventricular systolic function is normal. The right ventricular size is mildly enlarged. Tricuspid regurgitation signal is inadequate for assessing PA pressure.  3. Left atrial size was moderately dilated.  4. The mitral valve is normal in structure. s/p mitral valve clip. Moderate mitral valve regurgitation. Moderate mitral annular calcification. Degree of mitral valve stenosis was not measured.  5. The aortic valve  Physician Discharge Summary   Patient: Crystal Bass MRN: [id number] DOB: [birth year]  Admit date:     09/04/2023  Discharge date: 09/09/23  Discharge Physician: Loyce Dys   PCP: Eustaquio Boyden, MD    Recommendations at discharge:  Follow-up with cardiologist   Discharge Diagnoses: Atrial fibrillation with RVR (HCC)-improved Acute respiratory failure with hypoxia has been ruled out, respiratory distress only  Acute on chronic heart failure with preserved ejection fraction (HFpEF) (HCC) CAP (community acquired pneumonia) Sepsis (HCC) COPD (chronic obstructive pulmonary disease) (HCC) Essential hypertension S/P mitral valve clip implantation Parkinson disease (HCC) Daytime somnolence   Hospital Course: Crystal Bass is a 82 y.o. female with medical history significant of multiple medical issues including Parkinson's disease, COPD, mitral regurgitation status post mitral clip in 2023 with redo May 2024, chronic HFpEF, history PE, CAD, GERD, hypertension presented with acute respiratory failure with hypoxia, A-fib with RVR, acute on chronic HFpEF, pneumonia, COPD exacerbation, sepsis. Given persistent A-fib with RVR patient underwent TEE with DC cardioversion on 09/05/2023.  Rate has since been controlled and heparin was transitioned to Eliquis given high CHA2DS2-VASc risk score.  Patient was seen by cardiologist and have been cleared for discharge today and to follow-up as an outpatient with GDMT as blood pressure allows at follow-up.  Patient is to continue her outpatient amiodarone. 400 mg twice daily for 7 days (last dose is 09/14/2023) followed by 200 mg twice daily for 7 days(with last dose on 09/21/2023) followed by 200 mg daily thereafter.      Consultants: Cardiology Procedures performed: As mentioned above Disposition: Skilled nursing facility Diet recommendation:  Cardiac diet DISCHARGE MEDICATION:   Allergies as of 09/09/2023       Reactions   Sulfa  Antibiotics Anaphylaxis   Sulfonamide Derivatives Anaphylaxis   Topamax [topiramate] Other (See Comments)   Metabolic acidosis    Clarithromycin Other (See Comments)   pericarditis Other reaction(s): Not available   Hydrocodone Other (See Comments)   "hyper and climbing the walls"   Motrin [ibuprofen] Other (See Comments)   headaches   Misc. Sulfonamide Containing Compounds    Other reaction(s): Not available   Symbicort [budesonide-formoterol Fumarate] Other (See Comments)   02/07/15 tremor   Tetracycline Hcl Other (See Comments)   Percocet [oxycodone-acetaminophen] Itching, Rash   Tape Itching, Rash   Use paper tape only   Tetracyclines & Related Other (See Comments)   "immediate yeast infection"        Medication List     STOP taking these medications    cephALEXin 500 MG capsule Commonly known as: KEFLEX   metoprolol tartrate 25 MG tablet Commonly known as: LOPRESSOR   potassium chloride SA 20 MEQ tablet Commonly known as: KLOR-CON M   Prednisolon-Moxiflox-Bromfenac 1-0.5-0.075 % Soln       TAKE these medications    acetaminophen 500 MG tablet Commonly known as: TYLENOL Take 1,000 mg by mouth 3 (three) times daily.   albuterol 108 (90 Base) MCG/ACT inhaler Commonly known as: VENTOLIN HFA Inhale 2 puffs into the lungs every 6 (six) hours as needed for wheezing or shortness of breath.   ALLEGRA PO Take 180 mg by mouth daily at 8 pm.   amiodarone 400 MG tablet Commonly known as: PACERONE Take 1 tablet (400 mg total) by mouth 2 (two) times daily for 5 days.   amiodarone 200 MG tablet Commonly known as: PACERONE Take 1 tablet (200 mg total) by mouth 2 (two) times daily for 7 days, THEN 1  tablet (200 mg total) daily. Start taking on: September 14, 2023   apixaban 2.5 MG Tabs tablet Commonly known as: ELIQUIS Take 1 tablet (2.5 mg total) by mouth 2 (two) times daily.   calcium carbonate 500 MG chewable tablet Commonly known as: TUMS - dosed in mg  elemental calcium Chew 2 tablets by mouth daily as needed for indigestion or heartburn.   Carbidopa-Levodopa ER 25-100 MG tablet controlled release Commonly known as: SINEMET CR TAKE TWO TABLET BY MOUTH AT NINE IN THE MORNING, ONE TAB AT ONE IN THE EVENING AND ONE TAB AT FIVE IN THE EVENING   clonazePAM 0.5 MG tablet Commonly known as: KLONOPIN TAKE ONE HALF (1/2) A TABLET BY MOUTH AT BEDTIME   EPINEPHrine 0.3 mg/0.3 mL Soaj injection Commonly known as: EPI-PEN Inject 0.3 mg into the muscle as needed for anaphylaxis.   furosemide 40 MG tablet Commonly known as: LASIX TAKE ONE TABLET BY MOUTH ONCE DAILY *NEW DOSE*   gabapentin 300 MG capsule Commonly known as: [name] TAKE TWO CAPSULES BY MOUTH THREE TIMES DAILY   pantoprazole 20 MG tablet Commonly known as: Protonix Take 1 tablet (20 mg total) by mouth daily.   Trelegy Ellipta 100-62.5-25 MCG/ACT Aepb Generic drug: Fluticasone-Umeclidin-Vilant Inhale 1 puff into the lungs daily.   Vitamin D3 25 MCG (1000 UT) Caps Take 1 capsule (1,000 Units total) by mouth daily.        Follow-up Information     End, Cristal Deer, MD. Schedule an appointment as soon as possible for a visit in 1 week(s).   Specialty: Cardiology Contact information: [address] [place] [address] [phone]                Discharge Exam: Ceasar Mons Weights   09/07/23 0500 09/08/23 0423 09/09/23 0708  Weight: 57.1 kg 56.1 kg 52.5 kg   Constitutional: Patient laying in bed no more anxious    Head: Normocephalic.     Nose: Nose normal.     Mouth/Throat:     Mouth: Mucous membranes are moist.  Eyes:     Pupils: Pupils are equal, round, and reactive to light.  Cardiovascular:     Rate and Rhythm: Tachycardia resolved Abdominal:     General: Bowel sounds are normal.  Musculoskeletal:     Cervical back: Normal range of motion.     Comments: Positive generalized weakness  Skin:    General: Skin is warm.  Neurological:      General: No focal deficit present.     Comments: Positive generalized weakness and fatigue Otherwise grossly nonfocal neuroexam  Psychiatric:        Mood and Affect: Mood normal.     Condition at discharge: good  The results of significant diagnostics from this hospitalization (including imaging, microbiology, ancillary and laboratory) are listed below for reference.   Imaging Studies: ECHO TEE  Result Date: 09/06/2023    TRANSESOPHOGEAL ECHO REPORT   Patient Name:   Crystal Bass Date of Exam: 09/05/2023 Medical Rec #:  [id number]         Height:       62.0 in Accession #:    [id number]        Weight:       119.0 lb Date of Birth:  1941-07-13         BSA:          1.533 m Patient Age:    49 years

## 2023-09-09 NOTE — Progress Notes (Addendum)
Occupational Therapy Treatment Patient Details Name: Crystal Bass MRN: [id number] DOB: 06-25-1941 Today's Date: 09/09/2023   History of present illness 82 y.o. female with medical history significant of multiple medical issues including Parkinson's disease, COPD, mitral regurgitation status post mitral clip in 2023 with redo May 2024, chronic HFpEF, history PE, CAD, GERD, hypertension presented with acute respiratory failure with hypoxia, A-fib with RVR, acute on chronic HFpEF, pneumonia, COPD exacerbation, sepsis   OT comments  Crystal Bass was seen for OT treatment on this date. Upon arrival to room pt reclined in chair, appears confused about time with breakfast tray untouched, reoriented to time, pt agreeable to tx. Pt requires CGA + RW for BSC t/f and pericare standing. MOD A for brief mgmt in standing. Requests to return to bed. HR 60 bpm t/o session. Pt making good progress toward goals, will continue to follow POC. Discharge recommendation remains appropriate.       If plan is discharge home, recommend the following:  A little help with walking and/or transfers;A lot of help with bathing/dressing/bathroom;Assistance with cooking/housework;Assist for transportation;Help with stairs or ramp for entrance   Equipment Recommendations  BSC/3in1    Recommendations for Other Services      Precautions / Restrictions Precautions Precautions: Fall Restrictions Weight Bearing Restrictions: No       Mobility Bed Mobility Overal bed mobility: Needs Assistance Bed Mobility: Sit to Supine       Sit to supine: Min assist        Transfers Overall transfer level: Needs assistance Equipment used: Rolling walker (2 wheels) Transfers: Sit to/from Stand Sit to Stand: Contact guard assist                 Balance Overall balance assessment: Needs assistance Sitting-balance support: Feet supported, Single extremity supported Sitting balance-Leahy Scale: Fair     Standing  balance support: Single extremity supported, During functional activity Standing balance-Leahy Scale: Fair                             ADL either performed or assessed with clinical judgement   ADL Overall ADL's : Needs assistance/impaired                                       General ADL Comments: CGA + RW for BSC t/f and pericare standing. MOD A for brief mgmt in standing      Cognition Arousal: Alert Behavior During Therapy: WFL for tasks assessed/performed Overall Cognitive Status: No family/caregiver present to determine baseline cognitive functioning                                 General Comments: oriented to place and situation, disoriented to time and demonstrates STM deficits                   Pertinent Vitals/ Pain       Pain Assessment Pain Assessment: No/denies pain   Frequency  Min 1X/week        Progress Toward Goals  OT Goals(current goals can now be found in the care plan section)  Progress towards OT goals: Progressing toward goals  Acute Rehab OT Goals Patient Stated Goal: to go home OT Goal Formulation: With patient Time For Goal Achievement: 09/20/23 Potential to Achieve Goals: Good  ADL Goals Pt Will Transfer to Toilet: with contact guard assist;ambulating Pt Will Perform Toileting - Clothing Manipulation and hygiene: sitting/lateral leans;with set-up;with supervision Pt Will Perform Tub/Shower Transfer: with contact guard assist;ambulating;shower seat;rolling walker;grab bars Additional ADL Goal #1: Pt will complete grooming tasks while seated >29min with good static sitting balance, 2/2 opportunities.  Plan      Co-evaluation                 AM-PAC OT "6 Clicks" Daily Activity     Outcome Measure   Help from another person eating meals?: None Help from another person taking care of personal grooming?: A Little Help from another person toileting, which includes using toliet, bedpan,  or urinal?: A Little Help from another person bathing (including washing, rinsing, drying)?: A Lot Help from another person to put on and taking off regular upper body clothing?: A Little Help from another person to put on and taking off regular lower body clothing?: A Lot 6 Click Score: 17    End of Session    OT Visit Diagnosis: Other abnormalities of gait and mobility (R26.89);Muscle weakness (generalized) (M62.81)   Activity Tolerance Patient tolerated treatment well   Patient Left in bed;with call bell/phone within reach;with bed alarm set   Nurse Communication Mobility status        Time: 8119-1478 OT Time Calculation (min): 14 min  Charges: OT General Charges $OT Visit: 1 Visit OT Treatments $Self Care/Home Management : 8-22 mins  Kathie Dike, M.S. OTR/L  09/09/23, 12:46 PM  ascom [phone]

## 2023-09-09 NOTE — Plan of Care (Signed)
  Problem: Education: Goal: Ability to demonstrate management of disease process will improve Outcome: Progressing Goal: Ability to verbalize understanding of medication therapies will improve Outcome: Progressing Goal: Individualized Educational Video(s) Outcome: Progressing   Problem: Activity: Goal: Capacity to carry out activities will improve Outcome: Progressing   Problem: Cardiac: Goal: Ability to achieve and maintain adequate cardiopulmonary perfusion will improve Outcome: Progressing   Problem: Education: Goal: Knowledge of disease or condition will improve Outcome: Progressing Goal: Understanding of medication regimen will improve Outcome: Progressing Goal: Individualized Educational Video(s) Outcome: Progressing   Problem: Activity: Goal: Ability to tolerate increased activity will improve Outcome: Progressing   Problem: Cardiac: Goal: Ability to achieve and maintain adequate cardiopulmonary perfusion will improve Outcome: Progressing   Problem: Health Behavior/Discharge Planning: Goal: Ability to safely manage health-related needs after discharge will improve Outcome: Progressing   Problem: Fluid Volume: Goal: Hemodynamic stability will improve Outcome: Progressing   Problem: Clinical Measurements: Goal: Diagnostic test results will improve Outcome: Progressing Goal: Signs and symptoms of infection will decrease Outcome: Progressing   Problem: Respiratory: Goal: Ability to maintain adequate ventilation will improve Outcome: Progressing   Problem: Education: Goal: Knowledge of General Education information will improve Description: Including pain rating scale, medication(s)/side effects and non-pharmacologic comfort measures Outcome: Progressing   Problem: Health Behavior/Discharge Planning: Goal: Ability to manage health-related needs will improve Outcome: Progressing   Problem: Clinical Measurements: Goal: Ability to maintain clinical  measurements within normal limits will improve Outcome: Progressing Goal: Will remain free from infection Outcome: Progressing Goal: Diagnostic test results will improve Outcome: Progressing Goal: Respiratory complications will improve Outcome: Progressing Goal: Cardiovascular complication will be avoided Outcome: Progressing   Problem: Activity: Goal: Risk for activity intolerance will decrease Outcome: Progressing   Problem: Nutrition: Goal: Adequate nutrition will be maintained Outcome: Progressing   Problem: Coping: Goal: Level of anxiety will decrease Outcome: Progressing   Problem: Elimination: Goal: Will not experience complications related to bowel motility Outcome: Progressing Goal: Will not experience complications related to urinary retention Outcome: Progressing   Problem: Pain Managment: Goal: General experience of comfort will improve Outcome: Progressing   Problem: Safety: Goal: Ability to remain free from injury will improve Outcome: Progressing   Problem: Skin Integrity: Goal: Risk for impaired skin integrity will decrease Outcome: Progressing

## 2023-09-10 ENCOUNTER — Non-Acute Institutional Stay (SKILLED_NURSING_FACILITY): Payer: Medicare PPO | Admitting: Nurse Practitioner

## 2023-09-10 ENCOUNTER — Encounter: Payer: Self-pay | Admitting: Nurse Practitioner

## 2023-09-10 DIAGNOSIS — G20A1 Parkinson's disease without dyskinesia, without mention of fluctuations: Secondary | ICD-10-CM | POA: Diagnosis not present

## 2023-09-10 DIAGNOSIS — I5033 Acute on chronic diastolic (congestive) heart failure: Secondary | ICD-10-CM

## 2023-09-10 DIAGNOSIS — I10 Essential (primary) hypertension: Secondary | ICD-10-CM | POA: Diagnosis not present

## 2023-09-10 DIAGNOSIS — K219 Gastro-esophageal reflux disease without esophagitis: Secondary | ICD-10-CM | POA: Diagnosis not present

## 2023-09-10 DIAGNOSIS — I4891 Unspecified atrial fibrillation: Secondary | ICD-10-CM

## 2023-09-10 DIAGNOSIS — J189 Pneumonia, unspecified organism: Secondary | ICD-10-CM

## 2023-09-10 NOTE — Progress Notes (Signed)
regurgitation is mild to moderate. No evidence of tricuspid stenosis. Aortic Valve: The aortic valve is tricuspid. Aortic valve regurgitation is mild. No aortic stenosis is present. Pulmonic Valve: The pulmonic valve was normal in structure. Pulmonic valve  regurgitation is not visualized. No evidence of pulmonic stenosis. Aorta: The aortic root is normal in size and structure. Venous: The inferior vena cava is normal in size with greater than 50% respiratory variability, suggesting right atrial pressure of 3 mmHg. IAS/Shunts: Evidence of atrial level shunting detected by color flow Doppler. Small ASD shunting (L-->R). lilely from prior septal punture during mitraclip.  MITRAL VALVE             TRICUSPID VALVE MV Peak grad: 14.4 mmHg  TR Peak grad:   24.4 mmHg MV Mean grad: 6.0 mmHg   TR Vmax:        247.00 cm/s MV Vmax:      1.90 m/s MV Vmean:     107.0 cm/s Debbe Odea MD Electronically signed by Debbe Odea MD Signature Date/Time: 09/06/2023/8:40:16 AM    Final    ECHOCARDIOGRAM LIMITED  Result Date: 09/05/2023    ECHOCARDIOGRAM LIMITED REPORT   Patient Name:   Crystal Bass Date of Exam: 09/05/2023 Medical Rec #:  [id number]         Height:       62.0 in Accession #:    [id number]        Weight:       119.0 lb Date of Birth:  August 12, 1941         BSA:          1.533 m Patient Age:    82 years          BP:           115/81 mmHg Patient Gender: F                 HR:           104 bpm. Exam Location:  [facility] Procedure: 2D Echo, Limited Echo and Limited Color Doppler Indications:     Atrial Fibrillation  History:         Patient has prior history of Echocardiogram examinations, most                  recent 05/15/2023. CHF, COPD, Arrythmias:Atrial Fibrillation,                  Signs/Symptoms:Dyspnea; Risk Factors:Hypertension and                  Dyslipidemia. Pulmonary embolus, Lt Breast CA, A Mitra clip is                  present in the MV position.  Sonographer:     Mikki Harbor Referring Phys:  [id number] CADENCE H FURTH Diagnosing Phys: Julien Nordmann MD IMPRESSIONS  1. Left ventricular ejection fraction, by estimation, is 55 to 60%. The left ventricle has normal function. The left ventricle has no regional wall motion abnormalities. There is  moderate left ventricular hypertrophy. Left ventricular diastolic parameters are indeterminate.  2. Right ventricular systolic function is normal. The right ventricular size is mildly enlarged. Tricuspid regurgitation signal is inadequate for assessing PA pressure.  3. Left atrial size was moderately dilated.  4. The mitral valve is normal in structure. s/p mitral valve clip. Moderate mitral valve regurgitation. Moderate mitral annular calcification. Degree of mitral valve stenosis was not measured.  5. The aortic valve  regurgitation is mild to moderate. No evidence of tricuspid stenosis. Aortic Valve: The aortic valve is tricuspid. Aortic valve regurgitation is mild. No aortic stenosis is present. Pulmonic Valve: The pulmonic valve was normal in structure. Pulmonic valve  regurgitation is not visualized. No evidence of pulmonic stenosis. Aorta: The aortic root is normal in size and structure. Venous: The inferior vena cava is normal in size with greater than 50% respiratory variability, suggesting right atrial pressure of 3 mmHg. IAS/Shunts: Evidence of atrial level shunting detected by color flow Doppler. Small ASD shunting (L-->R). lilely from prior septal punture during mitraclip.  MITRAL VALVE             TRICUSPID VALVE MV Peak grad: 14.4 mmHg  TR Peak grad:   24.4 mmHg MV Mean grad: 6.0 mmHg   TR Vmax:        247.00 cm/s MV Vmax:      1.90 m/s MV Vmean:     107.0 cm/s Debbe Odea MD Electronically signed by Debbe Odea MD Signature Date/Time: 09/06/2023/8:40:16 AM    Final    ECHOCARDIOGRAM LIMITED  Result Date: 09/05/2023    ECHOCARDIOGRAM LIMITED REPORT   Patient Name:   Crystal Bass Date of Exam: 09/05/2023 Medical Rec #:  [id number]         Height:       62.0 in Accession #:    [id number]        Weight:       119.0 lb Date of Birth:  August 12, 1941         BSA:          1.533 m Patient Age:    82 years          BP:           115/81 mmHg Patient Gender: F                 HR:           104 bpm. Exam Location:  [facility] Procedure: 2D Echo, Limited Echo and Limited Color Doppler Indications:     Atrial Fibrillation  History:         Patient has prior history of Echocardiogram examinations, most                  recent 05/15/2023. CHF, COPD, Arrythmias:Atrial Fibrillation,                  Signs/Symptoms:Dyspnea; Risk Factors:Hypertension and                  Dyslipidemia. Pulmonary embolus, Lt Breast CA, A Mitra clip is                  present in the MV position.  Sonographer:     Mikki Harbor Referring Phys:  [id number] CADENCE H FURTH Diagnosing Phys: Julien Nordmann MD IMPRESSIONS  1. Left ventricular ejection fraction, by estimation, is 55 to 60%. The left ventricle has normal function. The left ventricle has no regional wall motion abnormalities. There is  moderate left ventricular hypertrophy. Left ventricular diastolic parameters are indeterminate.  2. Right ventricular systolic function is normal. The right ventricular size is mildly enlarged. Tricuspid regurgitation signal is inadequate for assessing PA pressure.  3. Left atrial size was moderately dilated.  4. The mitral valve is normal in structure. s/p mitral valve clip. Moderate mitral valve regurgitation. Moderate mitral annular calcification. Degree of mitral valve stenosis was not measured.  5. The aortic valve  regurgitation is mild to moderate. No evidence of tricuspid stenosis. Aortic Valve: The aortic valve is tricuspid. Aortic valve regurgitation is mild. No aortic stenosis is present. Pulmonic Valve: The pulmonic valve was normal in structure. Pulmonic valve  regurgitation is not visualized. No evidence of pulmonic stenosis. Aorta: The aortic root is normal in size and structure. Venous: The inferior vena cava is normal in size with greater than 50% respiratory variability, suggesting right atrial pressure of 3 mmHg. IAS/Shunts: Evidence of atrial level shunting detected by color flow Doppler. Small ASD shunting (L-->R). lilely from prior septal punture during mitraclip.  MITRAL VALVE             TRICUSPID VALVE MV Peak grad: 14.4 mmHg  TR Peak grad:   24.4 mmHg MV Mean grad: 6.0 mmHg   TR Vmax:        247.00 cm/s MV Vmax:      1.90 m/s MV Vmean:     107.0 cm/s Debbe Odea MD Electronically signed by Debbe Odea MD Signature Date/Time: 09/06/2023/8:40:16 AM    Final    ECHOCARDIOGRAM LIMITED  Result Date: 09/05/2023    ECHOCARDIOGRAM LIMITED REPORT   Patient Name:   Crystal Bass Date of Exam: 09/05/2023 Medical Rec #:  [id number]         Height:       62.0 in Accession #:    [id number]        Weight:       119.0 lb Date of Birth:  August 12, 1941         BSA:          1.533 m Patient Age:    82 years          BP:           115/81 mmHg Patient Gender: F                 HR:           104 bpm. Exam Location:  [facility] Procedure: 2D Echo, Limited Echo and Limited Color Doppler Indications:     Atrial Fibrillation  History:         Patient has prior history of Echocardiogram examinations, most                  recent 05/15/2023. CHF, COPD, Arrythmias:Atrial Fibrillation,                  Signs/Symptoms:Dyspnea; Risk Factors:Hypertension and                  Dyslipidemia. Pulmonary embolus, Lt Breast CA, A Mitra clip is                  present in the MV position.  Sonographer:     Mikki Harbor Referring Phys:  [id number] CADENCE H FURTH Diagnosing Phys: Julien Nordmann MD IMPRESSIONS  1. Left ventricular ejection fraction, by estimation, is 55 to 60%. The left ventricle has normal function. The left ventricle has no regional wall motion abnormalities. There is  moderate left ventricular hypertrophy. Left ventricular diastolic parameters are indeterminate.  2. Right ventricular systolic function is normal. The right ventricular size is mildly enlarged. Tricuspid regurgitation signal is inadequate for assessing PA pressure.  3. Left atrial size was moderately dilated.  4. The mitral valve is normal in structure. s/p mitral valve clip. Moderate mitral valve regurgitation. Moderate mitral annular calcification. Degree of mitral valve stenosis was not measured.  5. The aortic valve  Skin:    General: Skin is warm and dry.  Neurological:     Mental Status: She is alert and oriented to person, place, and time.     Motor: Weakness present.     Gait: Gait abnormal.  Psychiatric:        Mood and Affect: Mood normal.     Labs reviewed: Recent Labs    11/23/22 0431 11/24/22 0432 11/25/22 0458 12/07/22 1223 04/01/23 0816 09/04/23 1057 09/05/23 0521 09/06/23 0345 09/07/23 0313 09/08/23 0417  NA 140   < > 139 137   < >  --    < > 135 135 139  K 3.4*   < > 3.9 4.1   < >  --    < > 4.1 3.6 4.1  CL 101   < > 101 101   < >  --    < > 100 100 102  CO2 31   < > 30 28   < >  --    < > 23 26 26   GLUCOSE 85   < > 94 87   < >  --    < > 149* 111* 84  BUN 17   < > 11 17   < >  --    < > 27* 27* 23  CREATININE 0.55   < > 0.57 0.79   < >  --    < > 1.07* 0.82 0.78  CALCIUM 7.8*   < > 8.3* 9.4   < >  --    < > 8.4* 7.8* 8.3*  MG 2.4   < > 2.4 2.2  --  2.1  --   --   --   --   PHOS 3.8  --  3.0 3.6  --   --   --   --   --   --    < > = values in this interval not displayed.   Recent Labs    04/01/23 0816 08/05/23 1114 09/05/23 0521  AST 20 13 21   ALT 5 2 14   ALKPHOS 67 98 80  BILITOT 2.0* 1.0 1.5*  PROT 6.9 7.6 6.7  ALBUMIN 3.8 3.7 3.4*   Recent Labs    09/06/23 0345 09/07/23 0313 09/08/23 0417  WBC 5.2 6.2 6.0  NEUTROABS 4.5 4.8 4.3  HGB 11.9* 11.8* 12.7  HCT 36.4 36.8 38.9  MCV 101.1* 100.0 99.0  PLT 145* 140* 162   Lab Results  Component Value Date   TSH 1.145 09/07/2023   Lab Results  Component Value Date   HGBA1C 5.3 01/14/2018   Lab Results  Component Value Date   CHOL 171 08/08/2021   HDL 53.30 08/08/2021   LDLCALC 97 08/08/2021   LDLDIRECT 135.0 08/16/2009   TRIG 100.0 08/08/2021   CHOLHDL 3 08/08/2021    Significant Diagnostic Results in last 30 days:  ECHO  TEE  Result Date: 09/06/2023    TRANSESOPHOGEAL ECHO REPORT   Patient Name:   Crystal Bass Date of Exam: 09/05/2023 Medical Rec #:  [id number]         Height:       62.0 in Accession #:    [id number]        Weight:       119.0 lb Date of Birth:  [birth year]         BSA:          1.533 m Patient Age:    30 years  regurgitation is mild to moderate. No evidence of tricuspid stenosis. Aortic Valve: The aortic valve is tricuspid. Aortic valve regurgitation is mild. No aortic stenosis is present. Pulmonic Valve: The pulmonic valve was normal in structure. Pulmonic valve  regurgitation is not visualized. No evidence of pulmonic stenosis. Aorta: The aortic root is normal in size and structure. Venous: The inferior vena cava is normal in size with greater than 50% respiratory variability, suggesting right atrial pressure of 3 mmHg. IAS/Shunts: Evidence of atrial level shunting detected by color flow Doppler. Small ASD shunting (L-->R). lilely from prior septal punture during mitraclip.  MITRAL VALVE             TRICUSPID VALVE MV Peak grad: 14.4 mmHg  TR Peak grad:   24.4 mmHg MV Mean grad: 6.0 mmHg   TR Vmax:        247.00 cm/s MV Vmax:      1.90 m/s MV Vmean:     107.0 cm/s Debbe Odea MD Electronically signed by Debbe Odea MD Signature Date/Time: 09/06/2023/8:40:16 AM    Final    ECHOCARDIOGRAM LIMITED  Result Date: 09/05/2023    ECHOCARDIOGRAM LIMITED REPORT   Patient Name:   Crystal Bass Date of Exam: 09/05/2023 Medical Rec #:  [id number]         Height:       62.0 in Accession #:    [id number]        Weight:       119.0 lb Date of Birth:  August 12, 1941         BSA:          1.533 m Patient Age:    82 years          BP:           115/81 mmHg Patient Gender: F                 HR:           104 bpm. Exam Location:  [facility] Procedure: 2D Echo, Limited Echo and Limited Color Doppler Indications:     Atrial Fibrillation  History:         Patient has prior history of Echocardiogram examinations, most                  recent 05/15/2023. CHF, COPD, Arrythmias:Atrial Fibrillation,                  Signs/Symptoms:Dyspnea; Risk Factors:Hypertension and                  Dyslipidemia. Pulmonary embolus, Lt Breast CA, A Mitra clip is                  present in the MV position.  Sonographer:     Mikki Harbor Referring Phys:  [id number] CADENCE H FURTH Diagnosing Phys: Julien Nordmann MD IMPRESSIONS  1. Left ventricular ejection fraction, by estimation, is 55 to 60%. The left ventricle has normal function. The left ventricle has no regional wall motion abnormalities. There is  moderate left ventricular hypertrophy. Left ventricular diastolic parameters are indeterminate.  2. Right ventricular systolic function is normal. The right ventricular size is mildly enlarged. Tricuspid regurgitation signal is inadequate for assessing PA pressure.  3. Left atrial size was moderately dilated.  4. The mitral valve is normal in structure. s/p mitral valve clip. Moderate mitral valve regurgitation. Moderate mitral annular calcification. Degree of mitral valve stenosis was not measured.  5. The aortic valve  regurgitation is mild to moderate. No evidence of tricuspid stenosis. Aortic Valve: The aortic valve is tricuspid. Aortic valve regurgitation is mild. No aortic stenosis is present. Pulmonic Valve: The pulmonic valve was normal in structure. Pulmonic valve  regurgitation is not visualized. No evidence of pulmonic stenosis. Aorta: The aortic root is normal in size and structure. Venous: The inferior vena cava is normal in size with greater than 50% respiratory variability, suggesting right atrial pressure of 3 mmHg. IAS/Shunts: Evidence of atrial level shunting detected by color flow Doppler. Small ASD shunting (L-->R). lilely from prior septal punture during mitraclip.  MITRAL VALVE             TRICUSPID VALVE MV Peak grad: 14.4 mmHg  TR Peak grad:   24.4 mmHg MV Mean grad: 6.0 mmHg   TR Vmax:        247.00 cm/s MV Vmax:      1.90 m/s MV Vmean:     107.0 cm/s Debbe Odea MD Electronically signed by Debbe Odea MD Signature Date/Time: 09/06/2023/8:40:16 AM    Final    ECHOCARDIOGRAM LIMITED  Result Date: 09/05/2023    ECHOCARDIOGRAM LIMITED REPORT   Patient Name:   Crystal Bass Date of Exam: 09/05/2023 Medical Rec #:  [id number]         Height:       62.0 in Accession #:    [id number]        Weight:       119.0 lb Date of Birth:  August 12, 1941         BSA:          1.533 m Patient Age:    82 years          BP:           115/81 mmHg Patient Gender: F                 HR:           104 bpm. Exam Location:  [facility] Procedure: 2D Echo, Limited Echo and Limited Color Doppler Indications:     Atrial Fibrillation  History:         Patient has prior history of Echocardiogram examinations, most                  recent 05/15/2023. CHF, COPD, Arrythmias:Atrial Fibrillation,                  Signs/Symptoms:Dyspnea; Risk Factors:Hypertension and                  Dyslipidemia. Pulmonary embolus, Lt Breast CA, A Mitra clip is                  present in the MV position.  Sonographer:     Mikki Harbor Referring Phys:  [id number] CADENCE H FURTH Diagnosing Phys: Julien Nordmann MD IMPRESSIONS  1. Left ventricular ejection fraction, by estimation, is 55 to 60%. The left ventricle has normal function. The left ventricle has no regional wall motion abnormalities. There is  moderate left ventricular hypertrophy. Left ventricular diastolic parameters are indeterminate.  2. Right ventricular systolic function is normal. The right ventricular size is mildly enlarged. Tricuspid regurgitation signal is inadequate for assessing PA pressure.  3. Left atrial size was moderately dilated.  4. The mitral valve is normal in structure. s/p mitral valve clip. Moderate mitral valve regurgitation. Moderate mitral annular calcification. Degree of mitral valve stenosis was not measured.  5. The aortic valve  Skin:    General: Skin is warm and dry.  Neurological:     Mental Status: She is alert and oriented to person, place, and time.     Motor: Weakness present.     Gait: Gait abnormal.  Psychiatric:        Mood and Affect: Mood normal.     Labs reviewed: Recent Labs    11/23/22 0431 11/24/22 0432 11/25/22 0458 12/07/22 1223 04/01/23 0816 09/04/23 1057 09/05/23 0521 09/06/23 0345 09/07/23 0313 09/08/23 0417  NA 140   < > 139 137   < >  --    < > 135 135 139  K 3.4*   < > 3.9 4.1   < >  --    < > 4.1 3.6 4.1  CL 101   < > 101 101   < >  --    < > 100 100 102  CO2 31   < > 30 28   < >  --    < > 23 26 26   GLUCOSE 85   < > 94 87   < >  --    < > 149* 111* 84  BUN 17   < > 11 17   < >  --    < > 27* 27* 23  CREATININE 0.55   < > 0.57 0.79   < >  --    < > 1.07* 0.82 0.78  CALCIUM 7.8*   < > 8.3* 9.4   < >  --    < > 8.4* 7.8* 8.3*  MG 2.4   < > 2.4 2.2  --  2.1  --   --   --   --   PHOS 3.8  --  3.0 3.6  --   --   --   --   --   --    < > = values in this interval not displayed.   Recent Labs    04/01/23 0816 08/05/23 1114 09/05/23 0521  AST 20 13 21   ALT 5 2 14   ALKPHOS 67 98 80  BILITOT 2.0* 1.0 1.5*  PROT 6.9 7.6 6.7  ALBUMIN 3.8 3.7 3.4*   Recent Labs    09/06/23 0345 09/07/23 0313 09/08/23 0417  WBC 5.2 6.2 6.0  NEUTROABS 4.5 4.8 4.3  HGB 11.9* 11.8* 12.7  HCT 36.4 36.8 38.9  MCV 101.1* 100.0 99.0  PLT 145* 140* 162   Lab Results  Component Value Date   TSH 1.145 09/07/2023   Lab Results  Component Value Date   HGBA1C 5.3 01/14/2018   Lab Results  Component Value Date   CHOL 171 08/08/2021   HDL 53.30 08/08/2021   LDLCALC 97 08/08/2021   LDLDIRECT 135.0 08/16/2009   TRIG 100.0 08/08/2021   CHOLHDL 3 08/08/2021    Significant Diagnostic Results in last 30 days:  ECHO  TEE  Result Date: 09/06/2023    TRANSESOPHOGEAL ECHO REPORT   Patient Name:   Crystal Bass Date of Exam: 09/05/2023 Medical Rec #:  [id number]         Height:       62.0 in Accession #:    [id number]        Weight:       119.0 lb Date of Birth:  [birth year]         BSA:          1.533 m Patient Age:    30 years  Skin:    General: Skin is warm and dry.  Neurological:     Mental Status: She is alert and oriented to person, place, and time.     Motor: Weakness present.     Gait: Gait abnormal.  Psychiatric:        Mood and Affect: Mood normal.     Labs reviewed: Recent Labs    11/23/22 0431 11/24/22 0432 11/25/22 0458 12/07/22 1223 04/01/23 0816 09/04/23 1057 09/05/23 0521 09/06/23 0345 09/07/23 0313 09/08/23 0417  NA 140   < > 139 137   < >  --    < > 135 135 139  K 3.4*   < > 3.9 4.1   < >  --    < > 4.1 3.6 4.1  CL 101   < > 101 101   < >  --    < > 100 100 102  CO2 31   < > 30 28   < >  --    < > 23 26 26   GLUCOSE 85   < > 94 87   < >  --    < > 149* 111* 84  BUN 17   < > 11 17   < >  --    < > 27* 27* 23  CREATININE 0.55   < > 0.57 0.79   < >  --    < > 1.07* 0.82 0.78  CALCIUM 7.8*   < > 8.3* 9.4   < >  --    < > 8.4* 7.8* 8.3*  MG 2.4   < > 2.4 2.2  --  2.1  --   --   --   --   PHOS 3.8  --  3.0 3.6  --   --   --   --   --   --    < > = values in this interval not displayed.   Recent Labs    04/01/23 0816 08/05/23 1114 09/05/23 0521  AST 20 13 21   ALT 5 2 14   ALKPHOS 67 98 80  BILITOT 2.0* 1.0 1.5*  PROT 6.9 7.6 6.7  ALBUMIN 3.8 3.7 3.4*   Recent Labs    09/06/23 0345 09/07/23 0313 09/08/23 0417  WBC 5.2 6.2 6.0  NEUTROABS 4.5 4.8 4.3  HGB 11.9* 11.8* 12.7  HCT 36.4 36.8 38.9  MCV 101.1* 100.0 99.0  PLT 145* 140* 162   Lab Results  Component Value Date   TSH 1.145 09/07/2023   Lab Results  Component Value Date   HGBA1C 5.3 01/14/2018   Lab Results  Component Value Date   CHOL 171 08/08/2021   HDL 53.30 08/08/2021   LDLCALC 97 08/08/2021   LDLDIRECT 135.0 08/16/2009   TRIG 100.0 08/08/2021   CHOLHDL 3 08/08/2021    Significant Diagnostic Results in last 30 days:  ECHO  TEE  Result Date: 09/06/2023    TRANSESOPHOGEAL ECHO REPORT   Patient Name:   Crystal Bass Date of Exam: 09/05/2023 Medical Rec #:  [id number]         Height:       62.0 in Accession #:    [id number]        Weight:       119.0 lb Date of Birth:  [birth year]         BSA:          1.533 m Patient Age:    30 years  Skin:    General: Skin is warm and dry.  Neurological:     Mental Status: She is alert and oriented to person, place, and time.     Motor: Weakness present.     Gait: Gait abnormal.  Psychiatric:        Mood and Affect: Mood normal.     Labs reviewed: Recent Labs    11/23/22 0431 11/24/22 0432 11/25/22 0458 12/07/22 1223 04/01/23 0816 09/04/23 1057 09/05/23 0521 09/06/23 0345 09/07/23 0313 09/08/23 0417  NA 140   < > 139 137   < >  --    < > 135 135 139  K 3.4*   < > 3.9 4.1   < >  --    < > 4.1 3.6 4.1  CL 101   < > 101 101   < >  --    < > 100 100 102  CO2 31   < > 30 28   < >  --    < > 23 26 26   GLUCOSE 85   < > 94 87   < >  --    < > 149* 111* 84  BUN 17   < > 11 17   < >  --    < > 27* 27* 23  CREATININE 0.55   < > 0.57 0.79   < >  --    < > 1.07* 0.82 0.78  CALCIUM 7.8*   < > 8.3* 9.4   < >  --    < > 8.4* 7.8* 8.3*  MG 2.4   < > 2.4 2.2  --  2.1  --   --   --   --   PHOS 3.8  --  3.0 3.6  --   --   --   --   --   --    < > = values in this interval not displayed.   Recent Labs    04/01/23 0816 08/05/23 1114 09/05/23 0521  AST 20 13 21   ALT 5 2 14   ALKPHOS 67 98 80  BILITOT 2.0* 1.0 1.5*  PROT 6.9 7.6 6.7  ALBUMIN 3.8 3.7 3.4*   Recent Labs    09/06/23 0345 09/07/23 0313 09/08/23 0417  WBC 5.2 6.2 6.0  NEUTROABS 4.5 4.8 4.3  HGB 11.9* 11.8* 12.7  HCT 36.4 36.8 38.9  MCV 101.1* 100.0 99.0  PLT 145* 140* 162   Lab Results  Component Value Date   TSH 1.145 09/07/2023   Lab Results  Component Value Date   HGBA1C 5.3 01/14/2018   Lab Results  Component Value Date   CHOL 171 08/08/2021   HDL 53.30 08/08/2021   LDLCALC 97 08/08/2021   LDLDIRECT 135.0 08/16/2009   TRIG 100.0 08/08/2021   CHOLHDL 3 08/08/2021    Significant Diagnostic Results in last 30 days:  ECHO  TEE  Result Date: 09/06/2023    TRANSESOPHOGEAL ECHO REPORT   Patient Name:   Crystal Bass Date of Exam: 09/05/2023 Medical Rec #:  [id number]         Height:       62.0 in Accession #:    [id number]        Weight:       119.0 lb Date of Birth:  [birth year]         BSA:          1.533 m Patient Age:    30 years

## 2023-09-13 NOTE — Progress Notes (Unsigned)
Cardiology Clinic Note   Date: 09/17/2023 ID: Crystal, Bass February 19, 1941, MRN [id number]  Primary Cardiologist:  Lorine Bears, MD  Patient Profile    Crystal Bass is a 82 y.o. female who presents to the clinic today for hospital follow up.     Past medical history significant for: Chronic HFpEF. Limited echo 09/05/2023: EF 55 to 60%.  No RWMA.  Moderate LVH.  Indeterminate diastolic parameters.  Mild RVH.  Normal RV function.  Moderate LAE.  Mitral valve clip.  Moderate MR.  Moderate MAC.  Mild to moderate atrial septal defect with predominantly left-to-right shunting across the atrial septum. PAF. Onset October 2024. TEE/cardioversion 09/05/2023. Mitral valve prolapse. R/LHC 11/14/2021: Normal coronary arteries.  Normal LV systolic function.  Moderate to severe MR by LV angiography and severe by hemodynamic evaluation (giant V waves).  Right heart cath showed mildly elevated filling pressures, moderate pulmonary hypertension and normal cardiac output. TEER of the mitral valve with MitraClip x 2 02/15/2022. Repeat TEER of the mitral valve with MitraClip G4 XTW device 04/03/2023 Echo 05/15/2023: EF 60 to 65%.  No RWMA.  Mild LVH.  Indeterminate diastolic parameters.  Normal RV function.  Mild RVH.  Mildly elevated PA pressure.  There is a MitraClip present in the mitral position, average mean gradient 4 mmHg with HR 56 bpm.  Moderate MR that appears slightly more significant compared to prior echo.  Severe LAE.  Iatrogenic ASD present with left-to-right shunting moderate RAE.  Aortic valve sclerosis/calcification without stenosis.  Mild dilatation of ascending aorta 41 mm. TEE 09/05/2023: EF 50 to 55%.  No RWMA.  Normal RV function.  Severe LAE.  No LA/LAA thrombus.  Mild RAE.  Moderate MR, mean gradient 6 mmHg.  Mild to moderate TR.  Mild AI.  Evidence of atrial level shunting left to right likely from prior septal puncture during MitraClip. Hypertension. Orthostatic  hypotension. COPD. Parkinson disease. PE. Breast cancer. S/p mastectomy.     History of Present Illness    Crystal Bass is followed by Dr. Kirke Corin for the above outlined history.  In summary, patient has a history of mitral valve prolapse with severe MR, chest pain with normal coronary arteries per angiography December 2022, orthostatic hypotension, COPD without a smoking history, and Parkinson's.  Hospital admission in December 2019 for atypical chest pain.  Initial CT showed PE and the apical segment branch of the left upper lobe with evidence of bronchiectasis.  Repeat CT 2 days later showed resolution of the PE.  She was treated with Eliquis for 3 months.  She had worsening exertional dyspnea in 2022.  Echo October 2022 showed normal LV function, Grade II DD, small pericardial effusion, moderate to severe MR due to significant prolapse of anterior mitral leaflet.  She underwent [facility] March 2023 which showed normal coronary arteries and moderate to severe MR.  She underwent MitraClip placement times 25 January 2022 with minimal improvement in symptoms and subsequent echo showing evidence of severe MR confirmed by TEE.  She was scheduled for placement of a third MitraClip in July 2023 however canceled given risks of mitral stenosis.  In September 2023 she suffered a fall resulting in left hip fracture requiring left hip hemiarthroplasty.  Echo at that time showed normal LV function, grade 3 DD, mild LVH, moderate to severe MR and moderate mitral stenosis.  She was discharged to inpatient rehab with plan to eventually return home.  Patient was evaluated by structural heart in March 2024 for 1 year  follow-up following TEER.  At that time she continued to complain of poor quality of life with decreased ability to perform ADLs secondary to fatigue and dyspnea requiring her to move from independent to assisted living at [facility].  She underwent repeat TEER in May 2024.  Upon follow-up June 2024 she  reported improvement however continued to struggle with low energy.   Patient was last seen in the office by Dr. Kirke Corin on 08/27/2023 for routine follow-up.  She reported noticing increased dyspnea and palpitations over the week prior.  She was noted to be in A-fib with RVR.  She was started on reduced dose Eliquis in anticipation for cardioversion in 3 weeks time.  She was noted to be mildly volume overloaded felt to be secondary to A-fib with RVR.  Patient presented to the ED on 09/04/2023 with complaints of shortness of breath and increased lower extremity edema.  EKG demonstrated A-fib with RVR, 138 bpm.  Started on IV diltiazem and diuresed with IV Lasix.  She underwent successful TEE cardioversion on 09/05/2023.  Diltiazem was stopped and patient was placed on IV amiodarone with plan to transition to oral amiodarone.  Patient was discharged on 09/09/2023.  Discussed the use of AI scribe software for clinical note transcription with the patient, who gave verbal consent to proceed.  The patient presents for a follow-up visit after recent hospitalization for AFib. She is accompanied by her son. She is somewhat of a poor historian. She reports some improvement in breathing since hospital discharge. She is able to get around the nursing home with a rolling walker and is working with PT/OT. She reports provider at the nursing home noted "wrist drop" and weakness in left hand about 2 days ago and she is now wearing a wrist splint. She declined further imaging to determine if she had a stroke stating "what difference does it make." She reports occasional chest pain, which she has experienced for a long time, even before her valve procedure in 2023. The pain starts on the right side, goes up to her neck and then down to her left arm. It usually lasts for about 10-15 minutes and can go away on its own or with the help of Tums. She also reports some swelling in her legs, which is usually normal in the morning but  can increase throughout the day. She has noticed some blood in her nose almost daily, which she attributes to dry nasal passages. She denies any blood in her urine or stool. She has not noticed any palpitations since hospital discharge. She has a lot of questions about how she developed afib and if she will go back in it. She also would like to know if she can go back to normal activities such as driving. Her son reports there is no plan for her to go back to driving. She would like to walk for exercise and is encouraged to start with short, light walking at the nursing home and advance very slowly.        ROS: All other systems reviewed and are otherwise negative except as noted in History of Present Illness.  Studies Reviewed    EKG Interpretation Date/Time:  Tuesday September 17 2023 15:29:32 EDT Ventricular Rate:  57 PR Interval:  174 QRS Duration:  86 QT Interval:  488 QTC Calculation: 474 R Axis:   85  Text Interpretation: Sinus bradycardia Left ventricular hypertrophy with repolarization abnormality ( Sokolow-Lyon , Cornell product , Romhilt-Estes ) Septal infarct , age undetermined  Confirmed by Carlos Levering [phone]) on 09/17/2023 3:43:16 PM   Risk Assessment/Calculations     CHA2DS2-VASc Score = 5   This indicates a 7.2% annual risk of stroke. The patient's score is based upon: CHF History: 1 HTN History: 1 Diabetes History: 0 Stroke History: 0 Vascular Disease History: 0 Age Score: 2 Gender Score: 1             Physical Exam    VS:  BP 122/68 (BP Location: Left Arm, Patient Position: Sitting, Cuff Size: Normal)   Pulse (!) 57   Ht 5\' 2"  (1.575 m)   Wt 121 lb (54.9 kg)   SpO2 95%   BMI 22.13 kg/m  , BMI Body mass index is 22.13 kg/m.  GEN: Well nourished, well developed, in no acute distress. Neck: No JVD or carotid bruits. Cardiac:  RRR. 1/6 systolic murmur. No rubs or gallops.   Respiratory:  Respirations regular and unlabored. Clear to auscultation  without rales, wheezing or rhonchi. GI: Soft, nontender, nondistended. Extremities: Radials/DP/PT 2+ and equal bilaterally. No clubbing or cyanosis. Mild, nonpitting R>L edema. Compression stockings in place.   Skin: Warm and dry, no rash. Neuro: Grip strength 5/5 on the right and 4/5 on the left.   Assessment & Plan      PAF New onset noted on October 1st, 2024. Successfully cardioverted on 09/05/2023 during hospital admission. Currently maintaining sinus rhythm. On Eliquis for stroke prevention and Amiodarone for rhythm control. She reports occasional blood from nose. It is not a large amount and resolves quickly. She attributes this to dry nasal passages. She uses ointment on the inside of nose and feels this is helping. No other spontaneous bleeding concerns.  -Continue Eliquis and Amiodarone as prescribed. Appropriate Eliquis dose.   -Reduce dose of Amiodarone to 200 mg daily starting 10/27.  Chest Pain Recurrent episodes of chest pain, lasting 10-15 minutes, radiating from right chest to neck and down left arm. Pain is relieved by Tums and sometimes resolves on its own. Previous coronary angiogram in December 2022 showed normal coronary arteries. This pain has been occurring for years and is unchanged. This appears noncardiac in nature.  -Further ischemic workup not indicated at this time.  -Continue monitoring symptoms. -Encouraged patient to report any changes in chest pain.  Chronic HFpEF Echo October 2024 showed EF 55-60%, moderate LVH, mild RVH, moderate LAE. Patient with recent hospital admission for heart failure exacerbation in the setting of afib with RVR. Patient reports mild edema to lower extremities which she manages with elevation and compression. She denies orthopnea or PND. Mild R>L lower extremity edema otherwise euvolemic and well compensated on exam. GDMT limited secondary to orthostatic hypotension. - Continue Lasix.  Left sided Weakness Patient reports weakness in  left hand, difficulty opening containers. Recent onset. Occupational therapy initiated. She declined further imaging. Her left hand grip is slightly weaker when compared to the right.  -Continue with occupational therapy.  Mitral valve prolapse/MR S/p MitraClip x 25 January 2022 and repeat TEER with MitraClip May 2024. TEE October 2024 showed moderate MR, mean gradient 6 mmHg. Patient reports improving shortness of breath since discharge from hospital. Mild edema R>L lower extremities.  -Continue to follow with structural heart (scheduled May 2025).      Disposition: Return in 2 months or sooner as needed.          Signed, Etta Grandchild. Aurea Aronov, DNP, NP-C

## 2023-09-16 ENCOUNTER — Encounter: Payer: Self-pay | Admitting: Student

## 2023-09-16 ENCOUNTER — Non-Acute Institutional Stay (SKILLED_NURSING_FACILITY): Payer: Medicare PPO | Admitting: Student

## 2023-09-16 DIAGNOSIS — G20A1 Parkinson's disease without dyskinesia, without mention of fluctuations: Secondary | ICD-10-CM | POA: Diagnosis not present

## 2023-09-16 DIAGNOSIS — I4891 Unspecified atrial fibrillation: Secondary | ICD-10-CM

## 2023-09-16 DIAGNOSIS — J4489 Other specified chronic obstructive pulmonary disease: Secondary | ICD-10-CM | POA: Diagnosis not present

## 2023-09-16 DIAGNOSIS — Z7189 Other specified counseling: Secondary | ICD-10-CM | POA: Diagnosis not present

## 2023-09-16 DIAGNOSIS — Z66 Do not resuscitate: Secondary | ICD-10-CM | POA: Diagnosis not present

## 2023-09-16 DIAGNOSIS — I10 Essential (primary) hypertension: Secondary | ICD-10-CM | POA: Diagnosis not present

## 2023-09-16 DIAGNOSIS — M810 Age-related osteoporosis without current pathological fracture: Secondary | ICD-10-CM

## 2023-09-16 DIAGNOSIS — J189 Pneumonia, unspecified organism: Secondary | ICD-10-CM

## 2023-09-16 DIAGNOSIS — I5033 Acute on chronic diastolic (congestive) heart failure: Secondary | ICD-10-CM | POA: Diagnosis not present

## 2023-09-16 DIAGNOSIS — K219 Gastro-esophageal reflux disease without esophagitis: Secondary | ICD-10-CM | POA: Diagnosis not present

## 2023-09-16 LAB — BASIC METABOLIC PANEL
BUN: 17 (ref 4–21)
CO2: 31 — AB (ref 13–22)
Chloride: 101 (ref 99–108)
Creatinine: 0.9 (ref 0.5–1.1)
Glucose: 73
Potassium: 3.7 meq/L (ref 3.5–5.1)
Sodium: 140 (ref 137–147)

## 2023-09-16 LAB — COMPREHENSIVE METABOLIC PANEL
Calcium: 9.1 (ref 8.7–10.7)
eGFR: 62

## 2023-09-16 NOTE — Progress Notes (Signed)
14.4 mmHg  TR Peak grad:   24.4 mmHg MV Mean grad: 6.0 mmHg   TR Vmax:        247.00 cm/s MV Vmax:      1.90 m/s MV Vmean:     107.0 cm/s Debbe Odea MD Electronically signed by Debbe Odea MD Signature Date/Time: 09/06/2023/8:40:16 AM    Final    ECHOCARDIOGRAM LIMITED  Result Date: 09/05/2023    ECHOCARDIOGRAM LIMITED REPORT   Patient Name:   Crystal Bass Date of Exam: 09/05/2023 Medical Rec #:  [id number]         Height:       62.0 in Accession #:    [id number]        Weight:       119.0 lb Date of Birth:  [birth year]         BSA:          1.533 m Patient Age:    82 years          BP:           115/81 mmHg Patient Gender: F                 HR:           104 bpm. Exam Location:  [facility] Procedure: 2D Echo, Limited Echo and Limited Color Doppler Indications:     Atrial Fibrillation  History:         Patient has prior history of Echocardiogram examinations, most                  recent 05/15/2023. CHF, COPD, Arrythmias:Atrial Fibrillation,                  Signs/Symptoms:Dyspnea; Risk Factors:Hypertension and                  Dyslipidemia. Pulmonary embolus, Lt Breast CA, A Mitra clip is                  present in the MV position.  Sonographer:     Mikki Harbor Referring Phys:  [id number] CADENCE H FURTH Diagnosing Phys: Julien Nordmann MD IMPRESSIONS  1. Left ventricular ejection fraction, by  estimation, is 55 to 60%. The left ventricle has normal function. The left ventricle has no regional wall motion abnormalities. There is moderate left ventricular hypertrophy. Left ventricular diastolic parameters are indeterminate.  2. Right ventricular systolic function is normal. The right ventricular size is mildly enlarged. Tricuspid regurgitation signal is inadequate for assessing PA pressure.  3. Left atrial size was moderately dilated.  4. The mitral valve is normal in structure. s/p mitral valve clip. Moderate mitral valve regurgitation. Moderate mitral annular calcification. Degree of mitral valve stenosis was not measured.  5. The aortic valve has been repaired/replaced. Aortic valve regurgitation is not visualized. No aortic stenosis is present.  6. The inferior vena cava is normal in size with greater than 50% respiratory variability, suggesting right atrial pressure of 3 mmHg.  7. There is a mild to moderately sized atrial septal defect with predominantly left to right shunting across the atrial septum. FINDINGS  Left Ventricle: Left ventricular ejection fraction, by estimation, is 55 to 60%. The left ventricle has normal function. The left ventricle has no regional wall motion abnormalities. The left ventricular internal cavity size was normal in size. There is  moderate left ventricular hypertrophy. Left ventricular diastolic parameters are indeterminate. Right Ventricle: The right ventricular size is mildly enlarged. No increase  14.4 mmHg  TR Peak grad:   24.4 mmHg MV Mean grad: 6.0 mmHg   TR Vmax:        247.00 cm/s MV Vmax:      1.90 m/s MV Vmean:     107.0 cm/s Debbe Odea MD Electronically signed by Debbe Odea MD Signature Date/Time: 09/06/2023/8:40:16 AM    Final    ECHOCARDIOGRAM LIMITED  Result Date: 09/05/2023    ECHOCARDIOGRAM LIMITED REPORT   Patient Name:   Crystal Bass Date of Exam: 09/05/2023 Medical Rec #:  [id number]         Height:       62.0 in Accession #:    [id number]        Weight:       119.0 lb Date of Birth:  [birth year]         BSA:          1.533 m Patient Age:    82 years          BP:           115/81 mmHg Patient Gender: F                 HR:           104 bpm. Exam Location:  [facility] Procedure: 2D Echo, Limited Echo and Limited Color Doppler Indications:     Atrial Fibrillation  History:         Patient has prior history of Echocardiogram examinations, most                  recent 05/15/2023. CHF, COPD, Arrythmias:Atrial Fibrillation,                  Signs/Symptoms:Dyspnea; Risk Factors:Hypertension and                  Dyslipidemia. Pulmonary embolus, Lt Breast CA, A Mitra clip is                  present in the MV position.  Sonographer:     Mikki Harbor Referring Phys:  [id number] CADENCE H FURTH Diagnosing Phys: Julien Nordmann MD IMPRESSIONS  1. Left ventricular ejection fraction, by  estimation, is 55 to 60%. The left ventricle has normal function. The left ventricle has no regional wall motion abnormalities. There is moderate left ventricular hypertrophy. Left ventricular diastolic parameters are indeterminate.  2. Right ventricular systolic function is normal. The right ventricular size is mildly enlarged. Tricuspid regurgitation signal is inadequate for assessing PA pressure.  3. Left atrial size was moderately dilated.  4. The mitral valve is normal in structure. s/p mitral valve clip. Moderate mitral valve regurgitation. Moderate mitral annular calcification. Degree of mitral valve stenosis was not measured.  5. The aortic valve has been repaired/replaced. Aortic valve regurgitation is not visualized. No aortic stenosis is present.  6. The inferior vena cava is normal in size with greater than 50% respiratory variability, suggesting right atrial pressure of 3 mmHg.  7. There is a mild to moderately sized atrial septal defect with predominantly left to right shunting across the atrial septum. FINDINGS  Left Ventricle: Left ventricular ejection fraction, by estimation, is 55 to 60%. The left ventricle has normal function. The left ventricle has no regional wall motion abnormalities. The left ventricular internal cavity size was normal in size. There is  moderate left ventricular hypertrophy. Left ventricular diastolic parameters are indeterminate. Right Ventricle: The right ventricular size is mildly enlarged. No increase  100 100 102  CO2 31   < > 30 28   < >  --    < > 23 26 26   GLUCOSE 85   < > 94 87   < >  --    < > 149* 111* 84  BUN 17   < > 11 17   < >  --    < > 27* 27* 23  CREATININE 0.55   < > 0.57 0.79   < >  --    < > 1.07* 0.82 0.78  CALCIUM 7.8*   < > 8.3* 9.4   < >  --    < > 8.4* 7.8* 8.3*  MG 2.4   < > 2.4 2.2  --  2.1  --   --   --   --   PHOS 3.8  --  3.0 3.6  --   --   --   --   --   --    < > = values in this interval not displayed.   Liver Function Tests: Recent Labs    04/01/23 0816 08/05/23 1114 09/05/23 0521  AST 20 13 21   ALT 5 2 14   ALKPHOS 67 98 80  BILITOT 2.0* 1.0 1.5*  PROT 6.9 7.6 6.7  ALBUMIN 3.8 3.7 3.4*   No results for input(s): "LIPASE", "AMYLASE" in the last 8760 hours. No results for input(s): "AMMONIA" in the last 8760 hours. CBC: Recent Labs    09/06/23 0345 09/07/23 0313 09/08/23 0417  WBC 5.2 6.2 6.0  NEUTROABS 4.5 4.8 4.3  HGB 11.9* 11.8* 12.7  HCT 36.4 36.8 38.9  MCV 101.1* 100.0 99.0  PLT 145* 140* 162   Cardiac Enzymes: No results for input(s): "CKTOTAL", "CKMB", "CKMBINDEX", "TROPONINI" in the last 8760 hours. BNP: Invalid input(s): "POCBNP" Lab Results  Component Value Date   HGBA1C 5.3 01/14/2018   Lab Results  Component Value Date   TSH 1.145 09/07/2023   Lab Results  Component Value Date   VITAMINB12 350 09/25/2022   No  results found for: "FOLATE" No results found for: "IRON", "TIBC", "FERRITIN"  Imaging and Procedures obtained prior to SNF admission: ECHO TEE  Result Date: 09/06/2023    TRANSESOPHOGEAL ECHO REPORT   Patient Name:   Crystal Bass Date of Exam: 09/05/2023 Medical Rec #:  [id number]         Height:       62.0 in Accession #:    [id number]        Weight:       119.0 lb Date of Birth:  11/18/1941         BSA:          1.533 m Patient Age:    82 years          BP:           115/81 mmHg Patient Gender: F                 HR:           86 bpm. Exam Location:  [facility] Procedure: Transesophageal Echo, Cardiac Doppler and Color Doppler Indications:     Atrial Fibrillation  History:         Patient has prior history of Echocardiogram examinations, most                  recent 05/15/2023. CHF, COPD, Arrythmias:Atrial Fibrillation,  14.4 mmHg  TR Peak grad:   24.4 mmHg MV Mean grad: 6.0 mmHg   TR Vmax:        247.00 cm/s MV Vmax:      1.90 m/s MV Vmean:     107.0 cm/s Debbe Odea MD Electronically signed by Debbe Odea MD Signature Date/Time: 09/06/2023/8:40:16 AM    Final    ECHOCARDIOGRAM LIMITED  Result Date: 09/05/2023    ECHOCARDIOGRAM LIMITED REPORT   Patient Name:   Crystal Bass Date of Exam: 09/05/2023 Medical Rec #:  [id number]         Height:       62.0 in Accession #:    [id number]        Weight:       119.0 lb Date of Birth:  [birth year]         BSA:          1.533 m Patient Age:    82 years          BP:           115/81 mmHg Patient Gender: F                 HR:           104 bpm. Exam Location:  [facility] Procedure: 2D Echo, Limited Echo and Limited Color Doppler Indications:     Atrial Fibrillation  History:         Patient has prior history of Echocardiogram examinations, most                  recent 05/15/2023. CHF, COPD, Arrythmias:Atrial Fibrillation,                  Signs/Symptoms:Dyspnea; Risk Factors:Hypertension and                  Dyslipidemia. Pulmonary embolus, Lt Breast CA, A Mitra clip is                  present in the MV position.  Sonographer:     Mikki Harbor Referring Phys:  [id number] CADENCE H FURTH Diagnosing Phys: Julien Nordmann MD IMPRESSIONS  1. Left ventricular ejection fraction, by  estimation, is 55 to 60%. The left ventricle has normal function. The left ventricle has no regional wall motion abnormalities. There is moderate left ventricular hypertrophy. Left ventricular diastolic parameters are indeterminate.  2. Right ventricular systolic function is normal. The right ventricular size is mildly enlarged. Tricuspid regurgitation signal is inadequate for assessing PA pressure.  3. Left atrial size was moderately dilated.  4. The mitral valve is normal in structure. s/p mitral valve clip. Moderate mitral valve regurgitation. Moderate mitral annular calcification. Degree of mitral valve stenosis was not measured.  5. The aortic valve has been repaired/replaced. Aortic valve regurgitation is not visualized. No aortic stenosis is present.  6. The inferior vena cava is normal in size with greater than 50% respiratory variability, suggesting right atrial pressure of 3 mmHg.  7. There is a mild to moderately sized atrial septal defect with predominantly left to right shunting across the atrial septum. FINDINGS  Left Ventricle: Left ventricular ejection fraction, by estimation, is 55 to 60%. The left ventricle has normal function. The left ventricle has no regional wall motion abnormalities. The left ventricular internal cavity size was normal in size. There is  moderate left ventricular hypertrophy. Left ventricular diastolic parameters are indeterminate. Right Ventricle: The right ventricular size is mildly enlarged. No increase  100 100 102  CO2 31   < > 30 28   < >  --    < > 23 26 26   GLUCOSE 85   < > 94 87   < >  --    < > 149* 111* 84  BUN 17   < > 11 17   < >  --    < > 27* 27* 23  CREATININE 0.55   < > 0.57 0.79   < >  --    < > 1.07* 0.82 0.78  CALCIUM 7.8*   < > 8.3* 9.4   < >  --    < > 8.4* 7.8* 8.3*  MG 2.4   < > 2.4 2.2  --  2.1  --   --   --   --   PHOS 3.8  --  3.0 3.6  --   --   --   --   --   --    < > = values in this interval not displayed.   Liver Function Tests: Recent Labs    04/01/23 0816 08/05/23 1114 09/05/23 0521  AST 20 13 21   ALT 5 2 14   ALKPHOS 67 98 80  BILITOT 2.0* 1.0 1.5*  PROT 6.9 7.6 6.7  ALBUMIN 3.8 3.7 3.4*   No results for input(s): "LIPASE", "AMYLASE" in the last 8760 hours. No results for input(s): "AMMONIA" in the last 8760 hours. CBC: Recent Labs    09/06/23 0345 09/07/23 0313 09/08/23 0417  WBC 5.2 6.2 6.0  NEUTROABS 4.5 4.8 4.3  HGB 11.9* 11.8* 12.7  HCT 36.4 36.8 38.9  MCV 101.1* 100.0 99.0  PLT 145* 140* 162   Cardiac Enzymes: No results for input(s): "CKTOTAL", "CKMB", "CKMBINDEX", "TROPONINI" in the last 8760 hours. BNP: Invalid input(s): "POCBNP" Lab Results  Component Value Date   HGBA1C 5.3 01/14/2018   Lab Results  Component Value Date   TSH 1.145 09/07/2023   Lab Results  Component Value Date   VITAMINB12 350 09/25/2022   No  results found for: "FOLATE" No results found for: "IRON", "TIBC", "FERRITIN"  Imaging and Procedures obtained prior to SNF admission: ECHO TEE  Result Date: 09/06/2023    TRANSESOPHOGEAL ECHO REPORT   Patient Name:   Crystal Bass Date of Exam: 09/05/2023 Medical Rec #:  [id number]         Height:       62.0 in Accession #:    [id number]        Weight:       119.0 lb Date of Birth:  11/18/1941         BSA:          1.533 m Patient Age:    82 years          BP:           115/81 mmHg Patient Gender: F                 HR:           86 bpm. Exam Location:  [facility] Procedure: Transesophageal Echo, Cardiac Doppler and Color Doppler Indications:     Atrial Fibrillation  History:         Patient has prior history of Echocardiogram examinations, most                  recent 05/15/2023. CHF, COPD, Arrythmias:Atrial Fibrillation,  14.4 mmHg  TR Peak grad:   24.4 mmHg MV Mean grad: 6.0 mmHg   TR Vmax:        247.00 cm/s MV Vmax:      1.90 m/s MV Vmean:     107.0 cm/s Debbe Odea MD Electronically signed by Debbe Odea MD Signature Date/Time: 09/06/2023/8:40:16 AM    Final    ECHOCARDIOGRAM LIMITED  Result Date: 09/05/2023    ECHOCARDIOGRAM LIMITED REPORT   Patient Name:   Crystal Bass Date of Exam: 09/05/2023 Medical Rec #:  [id number]         Height:       62.0 in Accession #:    [id number]        Weight:       119.0 lb Date of Birth:  [birth year]         BSA:          1.533 m Patient Age:    82 years          BP:           115/81 mmHg Patient Gender: F                 HR:           104 bpm. Exam Location:  [facility] Procedure: 2D Echo, Limited Echo and Limited Color Doppler Indications:     Atrial Fibrillation  History:         Patient has prior history of Echocardiogram examinations, most                  recent 05/15/2023. CHF, COPD, Arrythmias:Atrial Fibrillation,                  Signs/Symptoms:Dyspnea; Risk Factors:Hypertension and                  Dyslipidemia. Pulmonary embolus, Lt Breast CA, A Mitra clip is                  present in the MV position.  Sonographer:     Mikki Harbor Referring Phys:  [id number] CADENCE H FURTH Diagnosing Phys: Julien Nordmann MD IMPRESSIONS  1. Left ventricular ejection fraction, by  estimation, is 55 to 60%. The left ventricle has normal function. The left ventricle has no regional wall motion abnormalities. There is moderate left ventricular hypertrophy. Left ventricular diastolic parameters are indeterminate.  2. Right ventricular systolic function is normal. The right ventricular size is mildly enlarged. Tricuspid regurgitation signal is inadequate for assessing PA pressure.  3. Left atrial size was moderately dilated.  4. The mitral valve is normal in structure. s/p mitral valve clip. Moderate mitral valve regurgitation. Moderate mitral annular calcification. Degree of mitral valve stenosis was not measured.  5. The aortic valve has been repaired/replaced. Aortic valve regurgitation is not visualized. No aortic stenosis is present.  6. The inferior vena cava is normal in size with greater than 50% respiratory variability, suggesting right atrial pressure of 3 mmHg.  7. There is a mild to moderately sized atrial septal defect with predominantly left to right shunting across the atrial septum. FINDINGS  Left Ventricle: Left ventricular ejection fraction, by estimation, is 55 to 60%. The left ventricle has normal function. The left ventricle has no regional wall motion abnormalities. The left ventricular internal cavity size was normal in size. There is  moderate left ventricular hypertrophy. Left ventricular diastolic parameters are indeterminate. Right Ventricle: The right ventricular size is mildly enlarged. No increase  100 100 102  CO2 31   < > 30 28   < >  --    < > 23 26 26   GLUCOSE 85   < > 94 87   < >  --    < > 149* 111* 84  BUN 17   < > 11 17   < >  --    < > 27* 27* 23  CREATININE 0.55   < > 0.57 0.79   < >  --    < > 1.07* 0.82 0.78  CALCIUM 7.8*   < > 8.3* 9.4   < >  --    < > 8.4* 7.8* 8.3*  MG 2.4   < > 2.4 2.2  --  2.1  --   --   --   --   PHOS 3.8  --  3.0 3.6  --   --   --   --   --   --    < > = values in this interval not displayed.   Liver Function Tests: Recent Labs    04/01/23 0816 08/05/23 1114 09/05/23 0521  AST 20 13 21   ALT 5 2 14   ALKPHOS 67 98 80  BILITOT 2.0* 1.0 1.5*  PROT 6.9 7.6 6.7  ALBUMIN 3.8 3.7 3.4*   No results for input(s): "LIPASE", "AMYLASE" in the last 8760 hours. No results for input(s): "AMMONIA" in the last 8760 hours. CBC: Recent Labs    09/06/23 0345 09/07/23 0313 09/08/23 0417  WBC 5.2 6.2 6.0  NEUTROABS 4.5 4.8 4.3  HGB 11.9* 11.8* 12.7  HCT 36.4 36.8 38.9  MCV 101.1* 100.0 99.0  PLT 145* 140* 162   Cardiac Enzymes: No results for input(s): "CKTOTAL", "CKMB", "CKMBINDEX", "TROPONINI" in the last 8760 hours. BNP: Invalid input(s): "POCBNP" Lab Results  Component Value Date   HGBA1C 5.3 01/14/2018   Lab Results  Component Value Date   TSH 1.145 09/07/2023   Lab Results  Component Value Date   VITAMINB12 350 09/25/2022   No  results found for: "FOLATE" No results found for: "IRON", "TIBC", "FERRITIN"  Imaging and Procedures obtained prior to SNF admission: ECHO TEE  Result Date: 09/06/2023    TRANSESOPHOGEAL ECHO REPORT   Patient Name:   Crystal Bass Date of Exam: 09/05/2023 Medical Rec #:  [id number]         Height:       62.0 in Accession #:    [id number]        Weight:       119.0 lb Date of Birth:  11/18/1941         BSA:          1.533 m Patient Age:    82 years          BP:           115/81 mmHg Patient Gender: F                 HR:           86 bpm. Exam Location:  [facility] Procedure: Transesophageal Echo, Cardiac Doppler and Color Doppler Indications:     Atrial Fibrillation  History:         Patient has prior history of Echocardiogram examinations, most                  recent 05/15/2023. CHF, COPD, Arrythmias:Atrial Fibrillation,  14.4 mmHg  TR Peak grad:   24.4 mmHg MV Mean grad: 6.0 mmHg   TR Vmax:        247.00 cm/s MV Vmax:      1.90 m/s MV Vmean:     107.0 cm/s Debbe Odea MD Electronically signed by Debbe Odea MD Signature Date/Time: 09/06/2023/8:40:16 AM    Final    ECHOCARDIOGRAM LIMITED  Result Date: 09/05/2023    ECHOCARDIOGRAM LIMITED REPORT   Patient Name:   Crystal Bass Date of Exam: 09/05/2023 Medical Rec #:  [id number]         Height:       62.0 in Accession #:    [id number]        Weight:       119.0 lb Date of Birth:  [birth year]         BSA:          1.533 m Patient Age:    82 years          BP:           115/81 mmHg Patient Gender: F                 HR:           104 bpm. Exam Location:  [facility] Procedure: 2D Echo, Limited Echo and Limited Color Doppler Indications:     Atrial Fibrillation  History:         Patient has prior history of Echocardiogram examinations, most                  recent 05/15/2023. CHF, COPD, Arrythmias:Atrial Fibrillation,                  Signs/Symptoms:Dyspnea; Risk Factors:Hypertension and                  Dyslipidemia. Pulmonary embolus, Lt Breast CA, A Mitra clip is                  present in the MV position.  Sonographer:     Mikki Harbor Referring Phys:  [id number] CADENCE H FURTH Diagnosing Phys: Julien Nordmann MD IMPRESSIONS  1. Left ventricular ejection fraction, by  estimation, is 55 to 60%. The left ventricle has normal function. The left ventricle has no regional wall motion abnormalities. There is moderate left ventricular hypertrophy. Left ventricular diastolic parameters are indeterminate.  2. Right ventricular systolic function is normal. The right ventricular size is mildly enlarged. Tricuspid regurgitation signal is inadequate for assessing PA pressure.  3. Left atrial size was moderately dilated.  4. The mitral valve is normal in structure. s/p mitral valve clip. Moderate mitral valve regurgitation. Moderate mitral annular calcification. Degree of mitral valve stenosis was not measured.  5. The aortic valve has been repaired/replaced. Aortic valve regurgitation is not visualized. No aortic stenosis is present.  6. The inferior vena cava is normal in size with greater than 50% respiratory variability, suggesting right atrial pressure of 3 mmHg.  7. There is a mild to moderately sized atrial septal defect with predominantly left to right shunting across the atrial septum. FINDINGS  Left Ventricle: Left ventricular ejection fraction, by estimation, is 55 to 60%. The left ventricle has normal function. The left ventricle has no regional wall motion abnormalities. The left ventricular internal cavity size was normal in size. There is  moderate left ventricular hypertrophy. Left ventricular diastolic parameters are indeterminate. Right Ventricle: The right ventricular size is mildly enlarged. No increase  100 100 102  CO2 31   < > 30 28   < >  --    < > 23 26 26   GLUCOSE 85   < > 94 87   < >  --    < > 149* 111* 84  BUN 17   < > 11 17   < >  --    < > 27* 27* 23  CREATININE 0.55   < > 0.57 0.79   < >  --    < > 1.07* 0.82 0.78  CALCIUM 7.8*   < > 8.3* 9.4   < >  --    < > 8.4* 7.8* 8.3*  MG 2.4   < > 2.4 2.2  --  2.1  --   --   --   --   PHOS 3.8  --  3.0 3.6  --   --   --   --   --   --    < > = values in this interval not displayed.   Liver Function Tests: Recent Labs    04/01/23 0816 08/05/23 1114 09/05/23 0521  AST 20 13 21   ALT 5 2 14   ALKPHOS 67 98 80  BILITOT 2.0* 1.0 1.5*  PROT 6.9 7.6 6.7  ALBUMIN 3.8 3.7 3.4*   No results for input(s): "LIPASE", "AMYLASE" in the last 8760 hours. No results for input(s): "AMMONIA" in the last 8760 hours. CBC: Recent Labs    09/06/23 0345 09/07/23 0313 09/08/23 0417  WBC 5.2 6.2 6.0  NEUTROABS 4.5 4.8 4.3  HGB 11.9* 11.8* 12.7  HCT 36.4 36.8 38.9  MCV 101.1* 100.0 99.0  PLT 145* 140* 162   Cardiac Enzymes: No results for input(s): "CKTOTAL", "CKMB", "CKMBINDEX", "TROPONINI" in the last 8760 hours. BNP: Invalid input(s): "POCBNP" Lab Results  Component Value Date   HGBA1C 5.3 01/14/2018   Lab Results  Component Value Date   TSH 1.145 09/07/2023   Lab Results  Component Value Date   VITAMINB12 350 09/25/2022   No  results found for: "FOLATE" No results found for: "IRON", "TIBC", "FERRITIN"  Imaging and Procedures obtained prior to SNF admission: ECHO TEE  Result Date: 09/06/2023    TRANSESOPHOGEAL ECHO REPORT   Patient Name:   Crystal Bass Date of Exam: 09/05/2023 Medical Rec #:  [id number]         Height:       62.0 in Accession #:    [id number]        Weight:       119.0 lb Date of Birth:  11/18/1941         BSA:          1.533 m Patient Age:    82 years          BP:           115/81 mmHg Patient Gender: F                 HR:           86 bpm. Exam Location:  [facility] Procedure: Transesophageal Echo, Cardiac Doppler and Color Doppler Indications:     Atrial Fibrillation  History:         Patient has prior history of Echocardiogram examinations, most                  recent 05/15/2023. CHF, COPD, Arrythmias:Atrial Fibrillation,

## 2023-09-17 ENCOUNTER — Ambulatory Visit: Payer: Medicare PPO | Attending: Student | Admitting: Student

## 2023-09-17 ENCOUNTER — Encounter: Payer: Self-pay | Admitting: Student

## 2023-09-17 VITALS — BP 122/68 | HR 57 | Ht 62.0 in | Wt 121.0 lb

## 2023-09-17 DIAGNOSIS — Z95818 Presence of other cardiac implants and grafts: Secondary | ICD-10-CM

## 2023-09-17 DIAGNOSIS — R531 Weakness: Secondary | ICD-10-CM

## 2023-09-17 DIAGNOSIS — I341 Nonrheumatic mitral (valve) prolapse: Secondary | ICD-10-CM

## 2023-09-17 DIAGNOSIS — I480 Paroxysmal atrial fibrillation: Secondary | ICD-10-CM

## 2023-09-17 DIAGNOSIS — R079 Chest pain, unspecified: Secondary | ICD-10-CM | POA: Diagnosis not present

## 2023-09-17 DIAGNOSIS — I5032 Chronic diastolic (congestive) heart failure: Secondary | ICD-10-CM | POA: Diagnosis not present

## 2023-09-17 DIAGNOSIS — Z9889 Other specified postprocedural states: Secondary | ICD-10-CM | POA: Diagnosis not present

## 2023-09-17 NOTE — Patient Instructions (Addendum)
Medication Instructions:  Your physician recommends the following medication changes.  START TAKING: Amiodarone 200 mg daily on 26 October, 2024 - After completing the 200 mg twice daily "loading" dose  *If you need a refill on your cardiac medications before your next appointment, please call your pharmacy*   Follow-Up: At [facility], you and your health needs are our priority.  As part of our continuing mission to provide you with exceptional heart care, we have created designated Provider Care Teams.  These Care Teams include your primary Cardiologist (physician) and Advanced Practice Providers (APPs -  Physician Assistants and Nurse Practitioners) who all work together to provide you with the care you need, when you need it.  We recommend signing up for the patient portal called "MyChart".  Sign up information is provided on this After Visit Summary.  MyChart is used to connect with patients for Virtual Visits (Telemedicine).  Patients are able to view lab/test results, encounter notes, upcoming appointments, etc.  Non-urgent messages can be sent to your provider as well.   To learn more about what you can do with MyChart, go to [email or web address].    Your next appointment:   2 month(s)  Provider:   You may see Lorine Bears, MD or one of the following Advanced Practice Providers on your designated Care Team:   Nicolasa Ducking, NP Eula Listen, PA-C Cadence Fransico Michael, PA-C Charlsie Quest, NP

## 2023-09-20 ENCOUNTER — Encounter: Payer: Self-pay | Admitting: Student

## 2023-09-24 ENCOUNTER — Non-Acute Institutional Stay (INDEPENDENT_AMBULATORY_CARE_PROVIDER_SITE_OTHER): Payer: Medicare PPO | Admitting: Nurse Practitioner

## 2023-09-24 ENCOUNTER — Encounter: Payer: Self-pay | Admitting: Nurse Practitioner

## 2023-09-24 DIAGNOSIS — Z0000 Encounter for general adult medical examination without abnormal findings: Secondary | ICD-10-CM

## 2023-09-24 NOTE — Patient Instructions (Signed)
  Ms. Courtade , Thank you for taking time to come for your Medicare Wellness Visit. I appreciate your ongoing commitment to your health goals. Please review the following plan we discussed and let me know if I can assist you in the future.      This is a list of the screening recommended for you and due dates:  Health Maintenance  Topic Date Due   COVID-19 Vaccine (8 - 2023-24 season) 07/28/2023   Medicare Annual Wellness Visit  09/23/2024   DTaP/Tdap/Td vaccine (3 - Tdap) 07/01/2031   Pneumonia Vaccine  Completed   Flu Shot  Completed   DEXA scan (bone density measurement)  Completed   Zoster (Shingles) Vaccine  Completed   HPV Vaccine  Aged Out   Colon Cancer Screening  Discontinued   Will get COVID vaccine this week.

## 2023-09-24 NOTE — Progress Notes (Signed)
Subjective:   Crystal Bass is a 82 y.o. female who presents for Medicare Annual (Subsequent) preventive examination.  Visit Complete: In person at [facility]    Cardiac Risk Factors include: advanced age (>35men, >32 women);hypertension;dyslipidemia     Objective:    Today's Vitals   09/24/23 1058 09/24/23 1252  BP: 138/72   Pulse: 62   Resp: 17   Temp: (!) 97.5 F (36.4 C)   SpO2: 97%   Weight: 117 lb 3.2 oz (53.2 kg)   Height: 5\' 2"  (1.575 m)   PainSc:  0-No pain   Body mass index is 21.44 kg/m.     09/24/2023   11:29 AM 09/16/2023    9:08 AM 09/10/2023   11:59 AM 09/09/2023    1:59 PM 09/05/2023   12:00 AM 09/04/2023   10:28 AM 08/09/2023    7:00 AM  Advanced Directives  Does Patient Have a Medical Advance Directive? Yes Yes Yes   Yes Yes  Type of Estate agent of [place];Out of facility DNR (pink MOST or yellow form);Living will Healthcare Power of [place];Out of facility DNR (pink MOST or yellow form);Living will Healthcare Power of [place];Out of facility DNR (pink MOST or yellow form);Living will Out of facility DNR (pink MOST or yellow form) Healthcare Power of [place];Out of facility DNR (pink MOST or yellow form) Out of facility DNR (pink MOST or yellow form) Healthcare Power of Attorney  Does patient want to make changes to medical advance directive? No - Patient declined No - Patient declined No - Patient declined    No - Patient declined  Copy of Healthcare Power of Attorney in Chart? Yes - validated most recent copy scanned in chart (See row information) Yes - validated most recent copy scanned in chart (See row information) Yes - validated most recent copy scanned in chart (See row information) No - copy requested No - copy requested  Yes - validated most recent copy scanned in chart (See row information)    Current Medications (verified) Outpatient Encounter Medications as of 09/24/2023  Medication Sig   acetaminophen  (TYLENOL) 500 MG tablet Take 1,000 mg by mouth 3 (three) times daily.   albuterol (VENTOLIN HFA) 108 (90 Base) MCG/ACT inhaler Inhale 2 puffs into the lungs every 6 (six) hours as needed for wheezing or shortness of breath.   amiodarone (PACERONE) 200 MG tablet Take 200 mg by mouth daily.   apixaban (ELIQUIS) 2.5 MG TABS tablet Take 1 tablet (2.5 mg total) by mouth 2 (two) times daily.   calcium carbonate (TUMS - DOSED IN MG ELEMENTAL CALCIUM) 500 MG chewable tablet Chew 2 tablets by mouth daily as needed for indigestion or heartburn.   Calcium Carbonate Antacid 600 MG chewable tablet Chew 600 mg by mouth daily.   Carbidopa-Levodopa ER (SINEMET CR) 25-100 MG tablet controlled release TAKE TWO TABLET BY MOUTH AT NINE IN THE MORNING, ONE TAB AT ONE IN THE EVENING AND ONE TAB AT FIVE IN THE EVENING   Cholecalciferol (VITAMIN D3) 25 MCG (1000 UT) CAPS Take 1 capsule (1,000 Units total) by mouth daily.   clonazePAM (KLONOPIN) 0.5 MG tablet TAKE ONE HALF (1/2) A TABLET BY MOUTH AT BEDTIME   EPINEPHrine 0.3 mg/0.3 mL IJ SOAJ injection Inject 0.3 mg into the muscle as needed for anaphylaxis.   Fexofenadine HCl (ALLEGRA PO) Take 180 mg by mouth daily at 8 pm.   Fluticasone-Umeclidin-Vilant (TRELEGY ELLIPTA) 100-62.5-25 MCG/ACT AEPB Inhale 1 puff into the lungs daily.  furosemide (LASIX) 40 MG tablet TAKE ONE TABLET BY MOUTH ONCE DAILY *NEW DOSE*   gabapentin ([name]) 300 MG capsule TAKE TWO CAPSULES BY MOUTH THREE TIMES DAILY   pantoprazole (PROTONIX) 20 MG tablet Take 1 tablet (20 mg total) by mouth daily.   [DISCONTINUED] amiodarone (PACERONE) 200 MG tablet Take 1 tablet (200 mg total) by mouth 2 (two) times daily for 7 days, THEN 1 tablet (200 mg total) daily. (Patient taking differently: 1 tablet (200 mg total) daily.)   [DISCONTINUED] calcium carbonate (TUMS - DOSED IN MG ELEMENTAL CALCIUM) 500 MG chewable tablet Chew 2 tablets by mouth daily as needed for indigestion or heartburn.   No  facility-administered encounter medications on file as of 09/24/2023.    Allergies (verified) Sulfa antibiotics, Sulfonamide derivatives, Topamax [topiramate], Clarithromycin, Hydrocodone, Motrin [ibuprofen], Misc. sulfonamide containing compounds, Symbicort [budesonide-formoterol fumarate], Tetracycline hcl, Percocet [oxycodone-acetaminophen], Tape, and Tetracyclines & related   History: Past Medical History:  Diagnosis Date   Aneurysm of aorta (HCC)    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   Anginal pain (HCC)    a. 10/2021 Cath: Nl cors.   Arthritis    Asthma    Breast cancer (HCC) 2002   infiltrative ductal carcinoma    Cataract 2019   Chronic heart failure with preserved ejection fraction (HFpEF) (HCC)    COPD (chronic obstructive pulmonary disease) (HCC) 07/2007   Coronary artery disease    Ductal carcinoma of breast, estrogen receptor positive, stage 1 (HCC) 09/16/2012   Endometriosis    GERD (gastroesophageal reflux disease)    Hx of mitral valve repair    Hypercalcemia 09/14/2013   Hypertension    Lesion of breast 1992   right, benign   Lung disease    secondary to [name] infection   Mastalgia 05/1994   Mitral regurgitation    a. 10/2021 RHC: PCWP V wave of 34; b. 01/2022 TEE: Severe flail/prolapse of large area of A2 scallop with severe posteriorly directed MR; c. 01/2022 TEER w /Mitraclip XTW x 2; d. 07/2022 Echo: EF 60-65%. No rwma, mild LVH, GrIII DD. Sev dil LA. Mod elev PASP. Mod-Sev MR. Mod MS.   Moderate mitral regurgitation by prior echocardiogram    Osteoporosis, post-menopausal 03/14/2012   Ovarian tumor of borderline malignancy, right 2004   Palpitations    a. 02/2022 Zio: Predominantly sinus rhythm at 67 (52-171).  2 runs of NSVT -fastest 9 beats at 167.  35 SVT runs-longest 19 beats, fastest 171x6 beats.   Pancreatitis    secondary to Cholelithiasis   Parkinson disease (HCC) 02/10/2015   Post-thoracotomy pain syndrome    Pseudomonas pneumonia (HCC) 03/2008    Status post implantation of mitral valve leaflet clip 02/15/2022   s/p XTW MitraClip x2 by Dr. Excell Seltzer in the A2/P2: b. 04/03/23 s/p third mitraclip implantation.   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   APPENDECTOMY  2004   BREAST BIOPSY  08/2004   right breast-benign   BREAST LUMPECTOMY  1992   benign   BUBBLE STUDY  12/27/2021   Procedure: BUBBLE STUDY;  Surgeon: Sande Rives, MD;  Location: [facility] ENDOSCOPY;  Service: Cardiovascular;;   CARDIAC CATHETERIZATION  2007   essentially negation for significant  CAD   CARDIOVERSION N/A 09/05/2023   Procedure: CARDIOVERSION;  Surgeon: Debbe Odea, MD;  Location: [facility] ORS;  Service: Cardiovascular;  Laterality: N/A;   CATARACT EXTRACTION W/PHACO Left 07/24/2023   Procedure: CATARACT EXTRACTION PHACO AND INTRAOCULAR LENS PLACEMENT (IOC) LEFT 5.34 0037.3;  Surgeon: Lockie Mola, MD;  Location: [facility];  Service: Ophthalmology;  Laterality: Left;   CATARACT EXTRACTION W/PHACO Right 08/07/2023   Procedure: CATARACT EXTRACTION PHACO AND INTRAOCULAR LENS PLACEMENT (IOC) RIGHT 7.83 00:47.9;  Surgeon: Lockie Mola, MD;  Location: [facility] SURGERY [facility]TR;  Service: Ophthalmology;  Laterality: Right;   CHOLECYSTECTOMY  2001   COLONOSCOPY  2014   Dr Marina Goodell , due 2019   COLONOSCOPY  09/2018   2 TA removed, int hem, no f/u needed (Dr Marina Goodell)    ESOPHAGOGASTRODUODENOSCOPY  09/2018   GERD with esophagitis and stricture dilated, antral gastritis negative for H pylori Marina Goodell)   HIP ARTHROPLASTY Left 08/21/2022   Procedure: ARTHROPLASTY BIPOLAR HIP (HEMIARTHROPLASTY);  Surgeon: Juanell Fairly, MD;  Location: [facility] ORS;  Service: Orthopedics;  Laterality: Left;   LUNG BIOPSY   2002   [name], Dr Edwyna Shell   MASTECTOMY MODIFIED RADICAL Left 2002   oral chemotheraphy (tamoxifen then Armidex) no radiation, Dr.[name]   RIGHT/LEFT HEART CATH AND CORONARY ANGIOGRAPHY Bilateral 11/14/2021   Procedure: RIGHT/LEFT HEART CATH AND CORONARY ANGIOGRAPHY;  Surgeon: Iran Ouch, MD;  Location: [facility] INVASIVE CV LAB;  Service: Cardiovascular;  Laterality: Bilateral;   TEE WITHOUT CARDIOVERSION N/A 12/29/2018   Procedure: TRANSESOPHAGEAL ECHOCARDIOGRAM (TEE);  Surgeon: Iran Ouch, MD;  Location: [facility] ORS;  Service: Cardiovascular;  Laterality: N/A;   TEE WITHOUT CARDIOVERSION N/A 12/27/2021   Procedure: TRANSESOPHAGEAL ECHOCARDIOGRAM (TEE);  Surgeon: Sande Rives, MD;  Location: [facility] ENDOSCOPY;  Service: Cardiovascular;  Laterality: N/A;   TEE WITHOUT CARDIOVERSION N/A 02/15/2022   Procedure: TRANSESOPHAGEAL ECHOCARDIOGRAM (TEE);  Surgeon: Tonny Bollman, MD;  Location: [facility] INVASIVE CV LAB;  Service: Cardiovascular;  Laterality: N/A;   TEE WITHOUT CARDIOVERSION N/A 05/14/2022   Procedure: TRANSESOPHAGEAL ECHOCARDIOGRAM (TEE);  Surgeon: Sande Rives, MD;  Location: [facility] ENDOSCOPY;  Service: Cardiovascular;  Laterality: N/A;   TEE WITHOUT CARDIOVERSION N/A 04/03/2023   Procedure: TRANSESOPHAGEAL ECHOCARDIOGRAM;  Surgeon: Tonny Bollman, MD;  Location: [facility] INVASIVE CV LAB;  Service: Cardiovascular;  Laterality: N/A;   TEE WITHOUT CARDIOVERSION N/A 09/05/2023   Procedure: TRANSESOPHAGEAL ECHOCARDIOGRAM;  Surgeon: Debbe Odea, MD;  Location: [facility] ORS;  Service: Cardiovascular;  Laterality: N/A;   TONSILLECTOMY  1946   TRANSCATHETER MITRAL EDGE TO EDGE REPAIR N/A 02/15/2022   Procedure: MITRAL VALVE REPAIR;  Surgeon: Tonny Bollman, MD;  Location: [facility] INVASIVE CV LAB;  Service: Cardiovascular;  Laterality: N/A;   TRANSCATHETER MITRAL EDGE TO EDGE REPAIR N/A 04/03/2023   Procedure: MITRAL VALVE REPAIR;  Surgeon: Tonny Bollman, MD;  Location: [facility] INVASIVE CV LAB;   Service: Cardiovascular;  Laterality: N/A;   Family History  Problem Relation Age of Onset   Emphysema Mother        d.64 was never a smoker   Lung cancer Father 41       d.82 history of smoking   Breast cancer Sister 58   Colon cancer Sister    Melanoma Maternal Aunt    Liver cancer Maternal Aunt    Cancer Maternal Uncle        unspecified type   Cancer Maternal Uncle        unspecified type   Cancer Maternal Uncle  unspecified type   Stroke Paternal Aunt        in mid 53s   Osteoporosis Paternal Aunt    Myasthenia gravis Paternal Aunt    Cancer Maternal Grandmother        d.82s unspecified GI cancer   Cancer Maternal Grandfather        d.62s unspecified type   Breast cancer Cousin        d.60s-daughter of unaffected paternal aunt Doristine Church   Colon cancer Cousin 60       d.70-daughter of unaffected maternal aunt Leila   Lung cancer Cousin 70       d.70-sisters to each other, both daughters of maternal uncle Johnny   Parkinson's disease Cousin    Diabetes Neg Hx    Heart disease Neg Hx    Stomach cancer Neg Hx    Ulcerative colitis Neg Hx    Social History   Socioeconomic History   Marital status: Widowed    Spouse name: Not on file   Number of children: 0   Years of education: Not on file   Highest education level: Not on file  Occupational History   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, 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, 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, 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  Tobacco Use   Smoking status: Never   Smokeless tobacco: Never  Vaping Use   Vaping status: Never Used  Substance and Sexual Activity   Alcohol use: No    Alcohol/week: 0.0 standard drinks of alcohol   Drug use: No   Sexual activity: Not Currently    Birth control/protection: Surgical    Comment: TAH/BSO  Other Topics Concern   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    Aunt of Dr Gilmore Laroche    Lives independent living at [facility]    No children    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, 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,  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, 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)    Activity: walking about 1 mile/day, enjoys [employer]    Diet: some water, fruits/vegetables some    Right handed    Social Determinants of Health   Financial Resource Strain: Low Risk  (08/13/2022)   Overall Financial Resource Strain (CARDIA)    Difficulty of Paying Living Expenses: Not hard at all  Food Insecurity: No Food Insecurity (09/05/2023)   Hunger Vital Sign    Worried About Running Out of Food in the Last Year: Never true    Ran Out of Food in the Last Year: Never true  Transportation Needs: No Transportation Needs (09/05/2023)   PRAPARE - Administrator, Civil Service (Medical): No    Lack of Transportation (Non-Medical): No  Physical Activity: Insufficiently Active (08/13/2022)   Exercise Vital Sign    Days of Exercise per Week: 4 days    Minutes of Exercise per Session: 30 min  Stress: No Stress Concern Present (08/13/2022)   [employer] of Occupational Health - Occupational Stress Questionnaire    Feeling of Stress : Not at all  Social Connections: Socially Isolated (08/13/2022)   Social Connection and Isolation Panel [NHANES]    Frequency of Communication with Friends and Family: More than three times a week    Frequency of Social Gatherings with Friends and Family: Once a week    Attends Religious Services: Never    Database 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 or Organizations: No    Attends Banker Meetings: Never    Marital Status: Widowed    Tobacco Counseling Counseling given: Not Answered   Clinical Intake:  Pre-visit  preparation completed: Yes  Pain : No/denies pain Pain Score: 0-No pain     BMI - recorded: 21 Diabetes: No  How often do you need to have someone help you when you read instructions, pamphlets, or other written materials from your doctor or pharmacy?: 5 - Always (unable to see)         Activities of Daily Living    09/24/2023   12:50 PM 09/05/2023   12:00 AM  In your present state of health, do you  have any difficulty performing the following activities:  Hearing? 0 0  Vision? 1 0  Difficulty concentrating or making decisions? 1 0  Walking or climbing stairs? 0   Dressing or bathing? 1   Doing errands, shopping? 1   Preparing Food and eating ? Y   Comment does not prepare food.   Using the Toilet? Y   In the past six months, have you accidently leaked urine? N   Do you have problems with loss of bowel control? N   Managing your Medications? Y   Managing your Finances? Y   Housekeeping or managing your Housekeeping? Y     Patient Care Team: Earnestine Mealing, MD as PCP - General (Family Medicine) Iran Ouch, MD as PCP - Cardiology (Cardiology) Sidney Ace, MD as Referring Physician (Allergy) Jerene Bears, MD as Consulting Physician (Gynecology) Tat, Octaviano Batty, DO as Consulting Physician (Neurology) Serena Croissant, MD as Consulting Physician (Hematology and Oncology) Tat, Octaviano Batty, DO as Consulting Physician (Neurology) Kathyrn Sheriff, [facility] (Inactive) as Pharmacist (Pharmacist)  Indicate any recent Medical Services you may have received from other than [facility] providers in the past year (date may be approximate).     Assessment:   This is a routine wellness examination for Lakshmi.  Hearing/Vision screen No results found.   Goals Addressed   None    Depression Screen    12/07/2022   11:43 AM 09/03/2022    2:04 PM 08/13/2022    1:11 PM 05/31/2022    4:02 PM 05/09/2022    2:17 PM 04/16/2022    1:45 PM 03/21/2022    3:10 PM  PHQ 2/9 Scores  PHQ - 2 Score 0 0 2 0 0 1 3  PHQ- 9 Score   2  2 9 9     Fall Risk    08/01/2023   10:29 AM 01/29/2023   10:32 AM 12/07/2022   11:43 AM 09/03/2022    2:04 PM 08/13/2022    1:14 PM  Fall Risk   Falls in the past year? 0 1 1 1  0  Number falls in past yr: 0 1 1 0 0  Injury with Fall? 0 1 1 1  0  Risk for fall due to :    History of fall(s);Impaired mobility;Orthopedic patient No Fall Risks  Follow up Falls evaluation  completed   Falls evaluation completed;Education provided;Falls prevention discussed Falls prevention discussed    MEDICARE RISK AT HOME: Medicare Risk at Home Any stairs in or around the home?: No If so, are there any without handrails?: No Home free of loose throw rugs in walkways, pet beds, electrical cords, etc?: Yes Adequate lighting in your home to reduce risk of falls?: Yes Life alert?: No Use of a cane, walker or w/c?: Yes Grab bars in the bathroom?: Yes Shower chair or bench in shower?: Yes Elevated toilet seat or a handicapped toilet?: Yes  TIMED UP AND GO:  Was the test performed?  No  Cognitive Function:    01/19/2019   11:16 AM 01/14/2018   11:32 AM 06/12/2017    2:23 PM 12/27/2016    1:51 PM  MMSE - Mini Mental State Exam  Not completed:   Unable to complete   Orientation to time 5 5  5   Orientation to Place 5 5  5   Registration 3 3  3   Attention/ Calculation 0 0  0  Recall 3 3  3   Language- name 2 objects 0 0  0  Language- repeat 1 1  1   Language- follow 3 step command 3 3  3   Language- read & follow direction 0 0  0  Write a sentence 0 0  0  Copy design 0 0  0  Total score 20 20  20       10/02/2016   10:48 AM  Montreal Cognitive Assessment   Visuospatial/ Executive (0/5) 4  Naming (0/3) 3  Attention: Read list of digits (0/2) 2  Attention: Read list of letters (0/1) 1  Attention: Serial 7 subtraction starting at 100 (0/3) 3  Language: Repeat phrase (0/2) 2  Language : Fluency (0/1) 1  Abstraction (0/2) 2  Delayed Recall (0/5) 3  Orientation (0/6) 6  Total 27  Adjusted Score (based on education) 27      08/13/2022    1:16 PM 08/05/2021    1:16 PM  6CIT Screen  What Year? 0 points 0 points  What month? 0 points 0 points  What time? 0 points 0 points  Count back from 20 0 points 0 points  Months in reverse 0 points 0 points  Repeat phrase 2 points 0 points  Total Score 2 points 0 points    Immunizations Immunization History   Administered Date(s) Administered   Fluad Quad(high Dose 65+) 08/11/2019, 08/03/2020, 08/08/2021, 09/06/2022   Influenza Split 09/11/2011   Influenza Whole 09/09/2007, 08/08/2008, 08/15/2010, 08/26/2012   Influenza,inj,Quad PF,6+ Mos 08/18/2013, 08/20/2014, 08/25/2018   Influenza-Unspecified 09/27/2015, 08/26/2017, 09/18/2023   Moderna Covid-19 Fall Seasonal Vaccine 42yrs & older 10/25/2022   Moderna Covid-19 Vaccine Bivalent Booster 81yrs & up 04/24/2022   Moderna Sars-Covid-2 Vaccination 01/01/2020, 01/20/2020, 02/16/2020, 07/25/2020, 11/22/2020   PPD Test 08/24/2022   Pneumococcal Conjugate-13 02/12/2014   Pneumococcal Polysaccharide-23 11/26/2006, 10/21/2012, 11/22/2020, 11/21/2021   Rsv, Bivalent, Protein Subunit Rsvpref,pf Verdis Frederickson) 10/25/2022   Td 01/01/2011, 06/30/2021   Zoster Recombinant(Shingrix) 02/22/2018, 07/08/2019   Zoster, Live 05/26/2007    TDAP status: Up to date  Flu Vaccine status: Up to date  Pneumococcal vaccine status: Up to date  Covid-19 vaccine status: Information provided on how to obtain vaccines.   Qualifies for Shingles Vaccine? Yes   Zostavax completed No   Shingrix Completed?: No.    Education has been provided regarding the importance of this vaccine. Patient has been advised to call insurance company to determine out of pocket expense if they have not yet received this vaccine. Advised may also receive vaccine at local pharmacy or Health Dept. Verbalized acceptance and understanding.  Screening Tests Health Maintenance  Topic Date Due   COVID-19 Vaccine (8 - 2023-24 season) 07/28/2023   Medicare Annual Wellness (AWV)  09/23/2024   DTaP/Tdap/Td (3 - Tdap) 07/01/2031   Pneumonia Vaccine 57+ Years old  Completed   INFLUENZA VACCINE  Completed   DEXA SCAN  Completed   Zoster Vaccines- Shingrix  Completed   HPV VACCINES  Aged Out   Colonoscopy  Discontinued    Health Maintenance  Health Maintenance Due  Topic  Date Due   COVID-19 Vaccine  (8 - 2023-24 season) 07/28/2023    Colorectal cancer screening: No longer required.   Mammogram status: No longer required due to age.  Bone Density status: Completed 04/06/2022. Results reflect: Bone density results: OSTEOPOROSIS. Repeat every 2 years.  Lung Cancer Screening: (Low Dose CT Chest recommended if Age 54-80 years, 20 pack-year currently smoking OR have quit w/in 15years.) does not qualify.   Lung Cancer Screening Referral: na  Additional Screening:  Hepatitis C Screening: does not qualify; Completed na  Vision Screening: Recommended annual ophthalmology exams for early detection of glaucoma and other disorders of the eye. Is the patient up to date with their annual eye exam?  Yes  Who is the provider or what is the name of the office in which the patient attends annual eye exams? [facility] If pt is not established with a provider, would they like to be referred to a provider to establish care? No .   Dental Screening: Recommended annual dental exams for proper oral hygiene    Community Resource Referral / Chronic Care Management: CRR required this visit?  No   CCM required this visit?  No     Plan:     I have personally reviewed and noted the following in the patient's chart:   Medical and social history Use of alcohol, tobacco or illicit drugs  Current medications and supplements including opioid prescriptions. Patient is not currently taking opioid prescriptions. Functional ability and status Nutritional status Physical activity Advanced directives List of other physicians Hospitalizations, surgeries, and ER visits in previous 12 months Vitals Screenings to include cognitive, depression, and falls Referrals and appointments  In addition, I have reviewed and discussed with patient certain preventive protocols, quality metrics, and best practice recommendations. A written personalized care plan for preventive services as well as general preventive  health recommendations were provided to patient.     Sharon Seller, NP   09/24/2023

## 2023-09-25 DIAGNOSIS — J449 Chronic obstructive pulmonary disease, unspecified: Secondary | ICD-10-CM | POA: Diagnosis not present

## 2023-09-25 DIAGNOSIS — I5033 Acute on chronic diastolic (congestive) heart failure: Secondary | ICD-10-CM | POA: Diagnosis not present

## 2023-09-25 DIAGNOSIS — G20A1 Parkinson's disease without dyskinesia, without mention of fluctuations: Secondary | ICD-10-CM | POA: Diagnosis not present

## 2023-09-25 DIAGNOSIS — M6281 Muscle weakness (generalized): Secondary | ICD-10-CM | POA: Diagnosis not present

## 2023-09-25 DIAGNOSIS — J189 Pneumonia, unspecified organism: Secondary | ICD-10-CM | POA: Diagnosis not present

## 2023-09-25 DIAGNOSIS — R2689 Other abnormalities of gait and mobility: Secondary | ICD-10-CM | POA: Diagnosis not present

## 2023-09-25 DIAGNOSIS — I4891 Unspecified atrial fibrillation: Secondary | ICD-10-CM | POA: Diagnosis not present

## 2023-09-25 DIAGNOSIS — Z9181 History of falling: Secondary | ICD-10-CM | POA: Diagnosis not present

## 2023-09-25 DIAGNOSIS — R2681 Unsteadiness on feet: Secondary | ICD-10-CM | POA: Diagnosis not present

## 2023-09-26 DIAGNOSIS — M6281 Muscle weakness (generalized): Secondary | ICD-10-CM | POA: Diagnosis not present

## 2023-09-26 DIAGNOSIS — I5033 Acute on chronic diastolic (congestive) heart failure: Secondary | ICD-10-CM | POA: Diagnosis not present

## 2023-09-26 DIAGNOSIS — J449 Chronic obstructive pulmonary disease, unspecified: Secondary | ICD-10-CM | POA: Diagnosis not present

## 2023-09-26 DIAGNOSIS — G20A1 Parkinson's disease without dyskinesia, without mention of fluctuations: Secondary | ICD-10-CM | POA: Diagnosis not present

## 2023-09-26 DIAGNOSIS — Z9181 History of falling: Secondary | ICD-10-CM | POA: Diagnosis not present

## 2023-09-26 DIAGNOSIS — I10 Essential (primary) hypertension: Secondary | ICD-10-CM | POA: Diagnosis not present

## 2023-09-26 DIAGNOSIS — I4891 Unspecified atrial fibrillation: Secondary | ICD-10-CM | POA: Diagnosis not present

## 2023-09-26 DIAGNOSIS — R2681 Unsteadiness on feet: Secondary | ICD-10-CM | POA: Diagnosis not present

## 2023-09-26 DIAGNOSIS — J189 Pneumonia, unspecified organism: Secondary | ICD-10-CM | POA: Diagnosis not present

## 2023-09-26 DIAGNOSIS — R2689 Other abnormalities of gait and mobility: Secondary | ICD-10-CM | POA: Diagnosis not present

## 2023-09-26 LAB — CBC AND DIFFERENTIAL
HCT: 39 (ref 36–46)
Hemoglobin: 12.4 (ref 12.0–16.0)
Platelets: 173 10*3/uL (ref 150–400)
WBC: 3.8

## 2023-09-26 LAB — CBC: RBC: 3.88 (ref 3.87–5.11)

## 2023-09-27 DIAGNOSIS — R2689 Other abnormalities of gait and mobility: Secondary | ICD-10-CM | POA: Diagnosis not present

## 2023-09-27 DIAGNOSIS — R2681 Unsteadiness on feet: Secondary | ICD-10-CM | POA: Diagnosis not present

## 2023-09-27 DIAGNOSIS — J189 Pneumonia, unspecified organism: Secondary | ICD-10-CM | POA: Diagnosis not present

## 2023-09-27 DIAGNOSIS — Z9181 History of falling: Secondary | ICD-10-CM | POA: Diagnosis not present

## 2023-09-27 DIAGNOSIS — M6281 Muscle weakness (generalized): Secondary | ICD-10-CM | POA: Diagnosis not present

## 2023-09-27 DIAGNOSIS — J449 Chronic obstructive pulmonary disease, unspecified: Secondary | ICD-10-CM | POA: Diagnosis not present

## 2023-09-27 DIAGNOSIS — I5033 Acute on chronic diastolic (congestive) heart failure: Secondary | ICD-10-CM | POA: Diagnosis not present

## 2023-09-27 DIAGNOSIS — I4891 Unspecified atrial fibrillation: Secondary | ICD-10-CM | POA: Diagnosis not present

## 2023-09-27 DIAGNOSIS — G20A1 Parkinson's disease without dyskinesia, without mention of fluctuations: Secondary | ICD-10-CM | POA: Diagnosis not present

## 2023-09-30 DIAGNOSIS — R2681 Unsteadiness on feet: Secondary | ICD-10-CM | POA: Diagnosis not present

## 2023-09-30 DIAGNOSIS — J189 Pneumonia, unspecified organism: Secondary | ICD-10-CM | POA: Diagnosis not present

## 2023-09-30 DIAGNOSIS — I5033 Acute on chronic diastolic (congestive) heart failure: Secondary | ICD-10-CM | POA: Diagnosis not present

## 2023-09-30 DIAGNOSIS — I4891 Unspecified atrial fibrillation: Secondary | ICD-10-CM | POA: Diagnosis not present

## 2023-09-30 DIAGNOSIS — R2689 Other abnormalities of gait and mobility: Secondary | ICD-10-CM | POA: Diagnosis not present

## 2023-09-30 DIAGNOSIS — Z9181 History of falling: Secondary | ICD-10-CM | POA: Diagnosis not present

## 2023-09-30 DIAGNOSIS — M6281 Muscle weakness (generalized): Secondary | ICD-10-CM | POA: Diagnosis not present

## 2023-09-30 DIAGNOSIS — G20A1 Parkinson's disease without dyskinesia, without mention of fluctuations: Secondary | ICD-10-CM | POA: Diagnosis not present

## 2023-09-30 DIAGNOSIS — J449 Chronic obstructive pulmonary disease, unspecified: Secondary | ICD-10-CM | POA: Diagnosis not present

## 2023-09-30 IMAGING — DX DG CHEST 2V
2 series · 2 of 2 positions shown · non-contrast
Comparison: Chest radiograph 02/12/2022

CLINICAL DATA: Follow-up atypical pneumonia, dyspnea on exertion

EXAM:
CHEST - 2 VIEW

[chest pa]
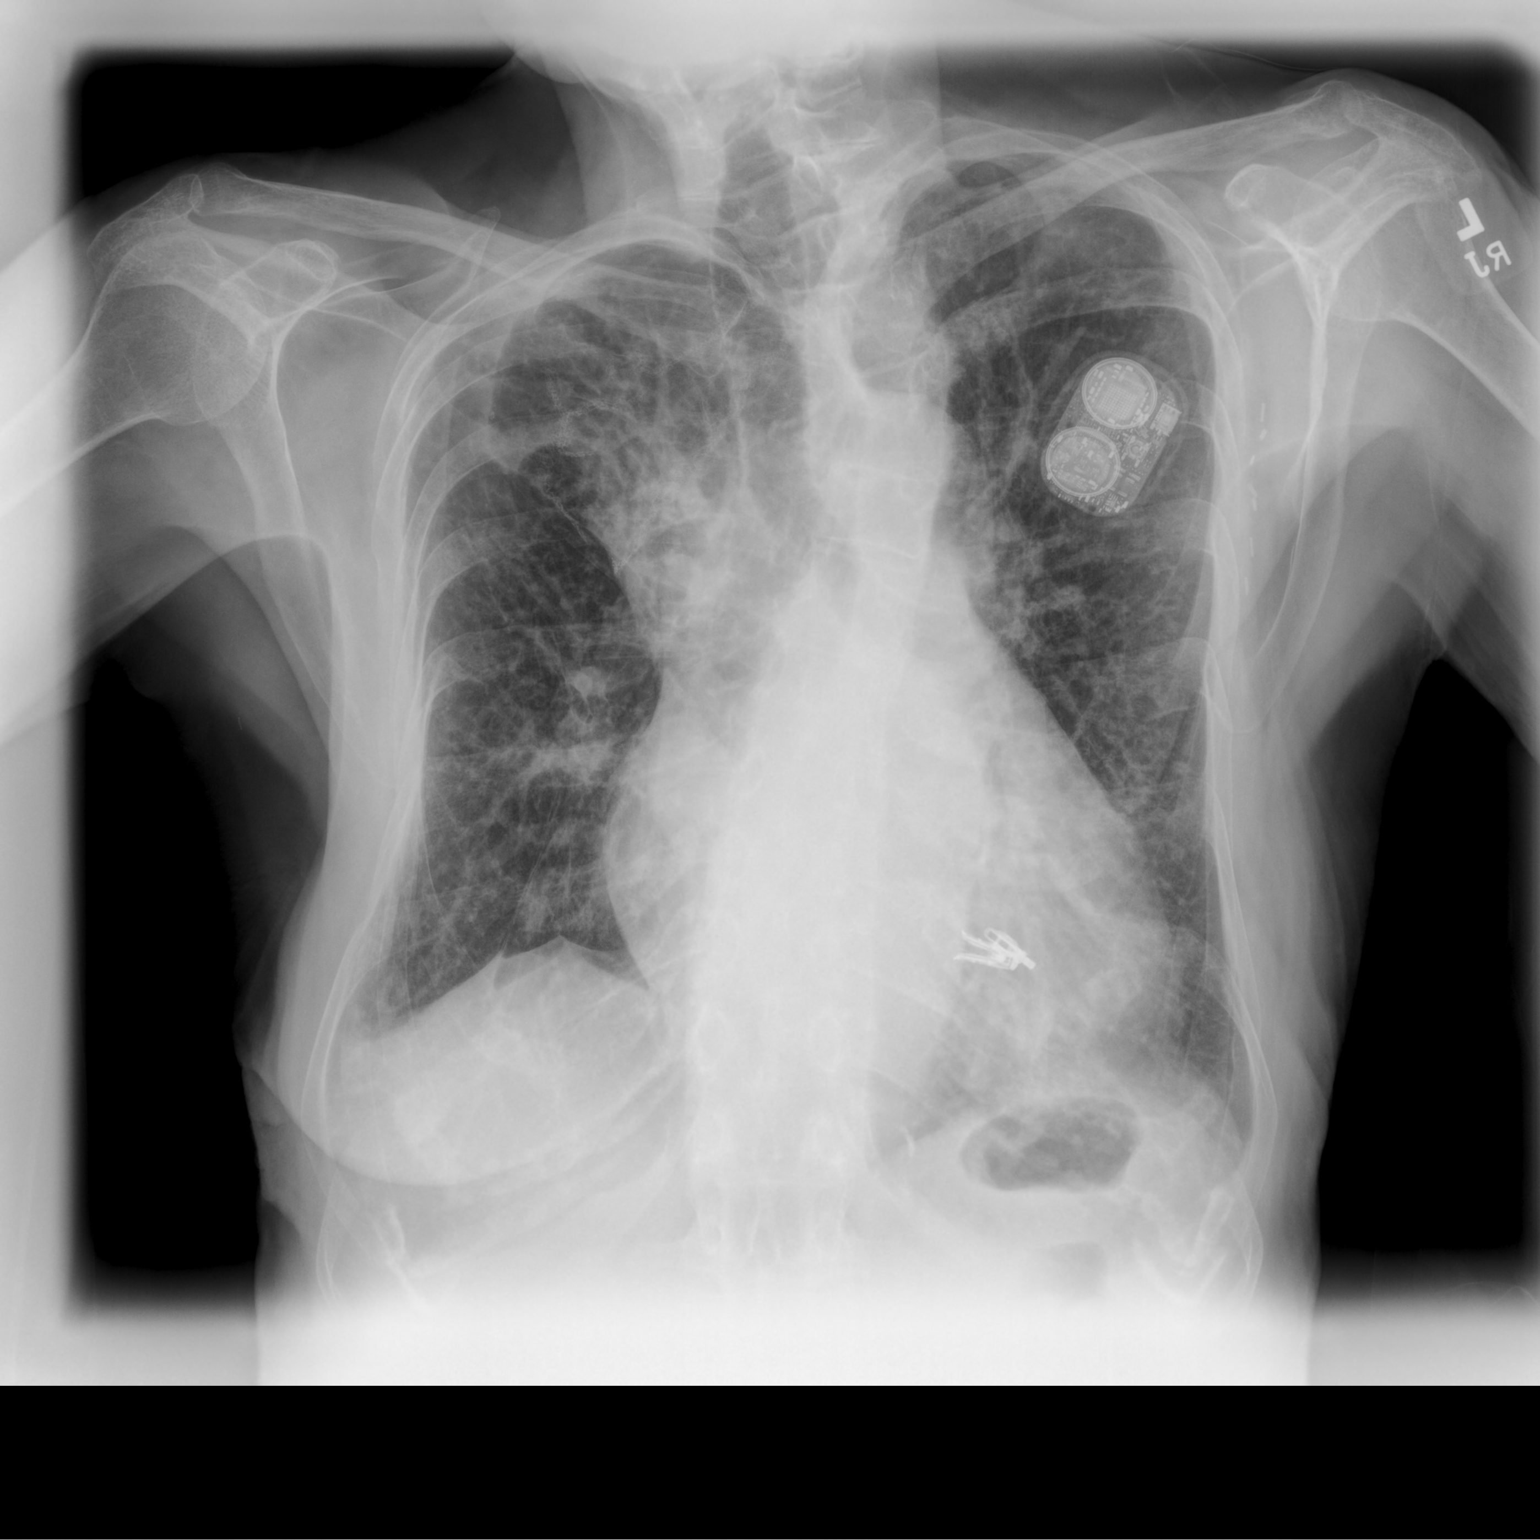

[chest lat]
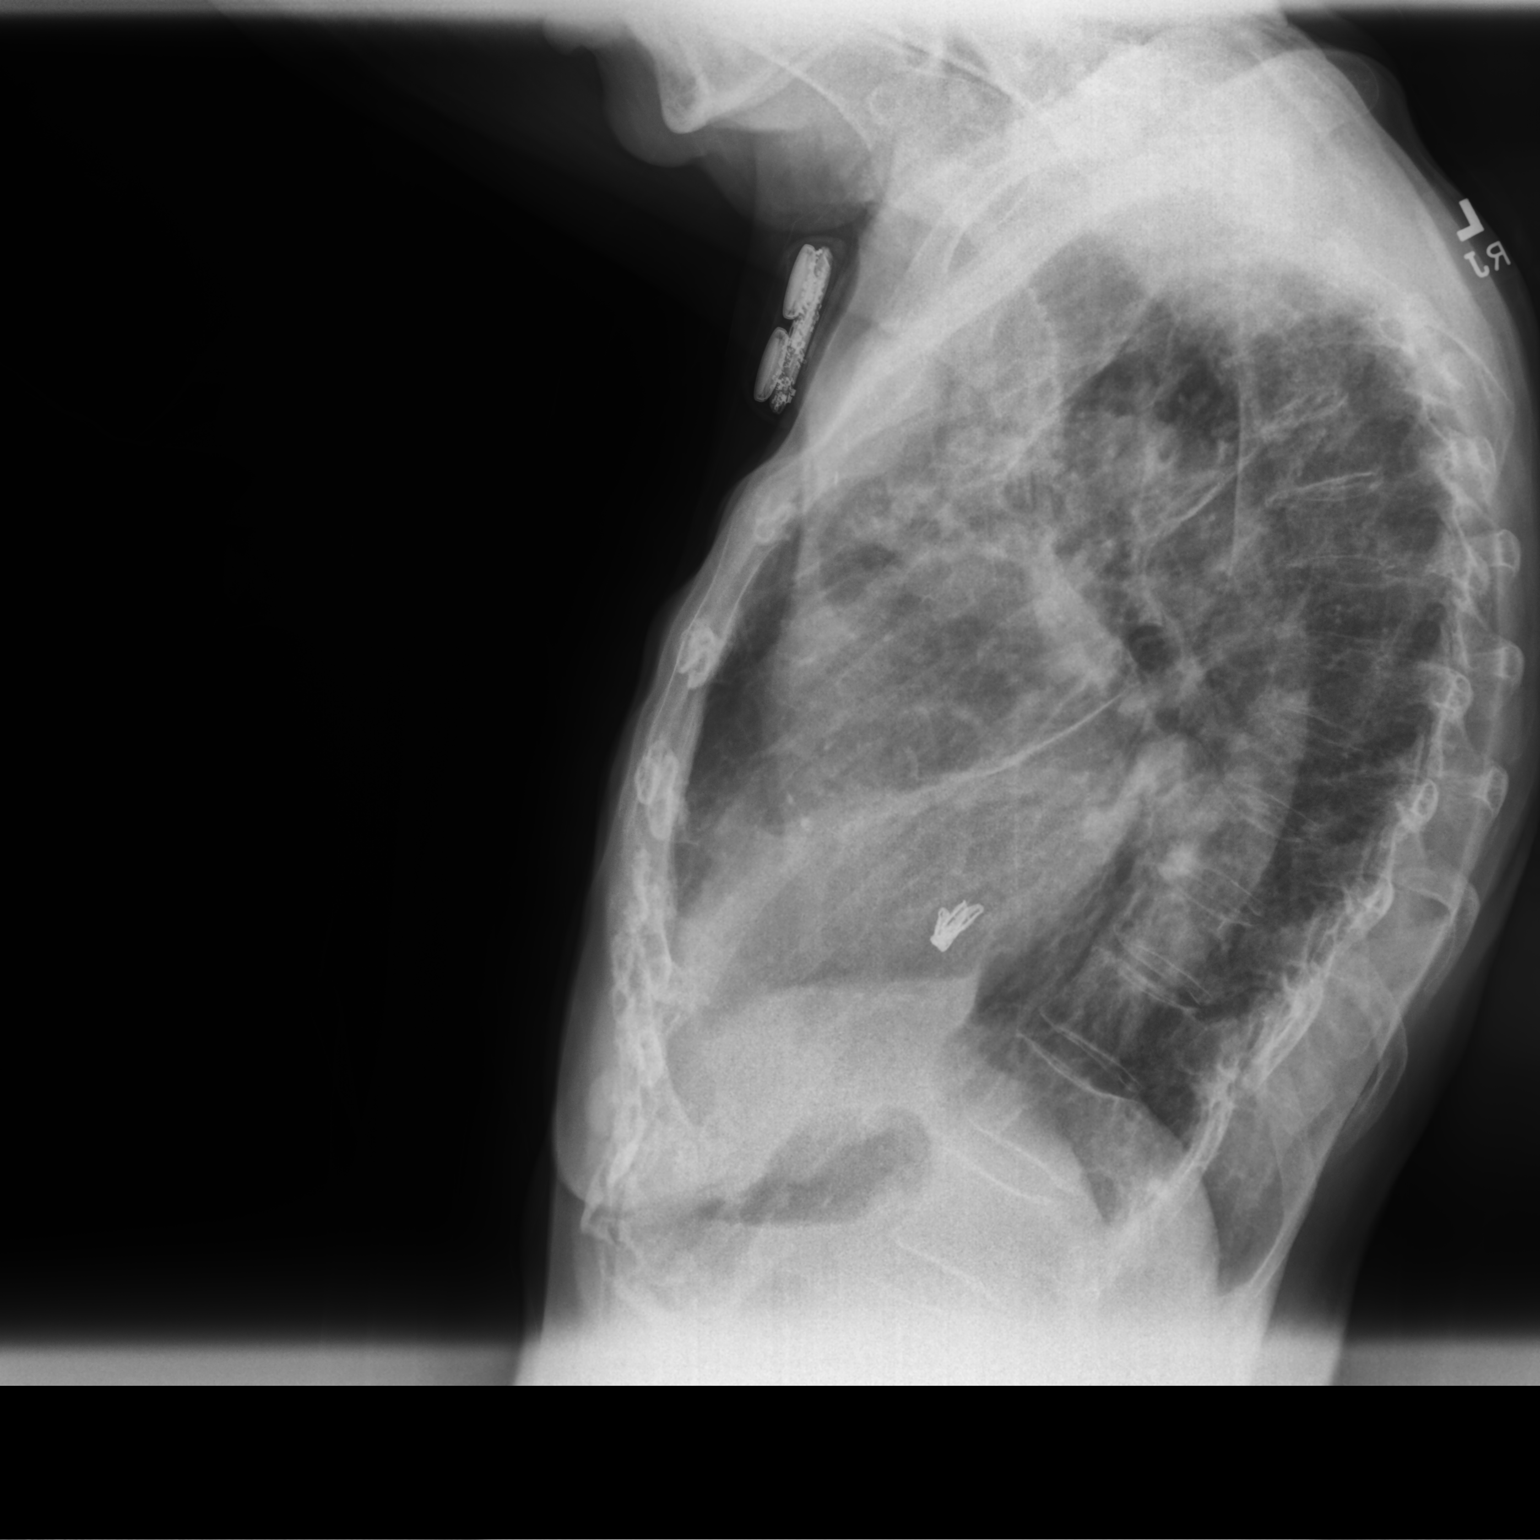

[2 of 2 positions shown; findings below may reference images not displayed]

FINDINGS: The heart is enlarged, unchanged. The upper mediastinal contours are
stable. There is a new mitral clip projecting over the left
ventricle. A cardiac device projects over the left chest wall.

There are coarsened opacities in the right upper lobe with volume
loss, architectural distortion, and bronchiectasis, overall similar
to the prior study from 02/12/2022. Mildly coarsened interstitial
markings throughout the remainder of the lungs also not
significantly changed. There is no new or worsening focal airspace
disease. There is no pleural effusion. There is no pneumothorax.

There is no acute osseous abnormality.
IMPRESSION: Overall stable appearance of the chest with coarsened opacities and
architectural distortion in the right upper lobe compared to the
study from 02/12/2022. No new or worsening focal airspace disease.

## 2023-10-01 ENCOUNTER — Other Ambulatory Visit: Payer: Self-pay | Admitting: Adult Health

## 2023-10-01 ENCOUNTER — Other Ambulatory Visit: Payer: Self-pay | Admitting: Nurse Practitioner

## 2023-10-01 DIAGNOSIS — I4891 Unspecified atrial fibrillation: Secondary | ICD-10-CM | POA: Diagnosis not present

## 2023-10-01 DIAGNOSIS — I5033 Acute on chronic diastolic (congestive) heart failure: Secondary | ICD-10-CM | POA: Diagnosis not present

## 2023-10-01 DIAGNOSIS — R2689 Other abnormalities of gait and mobility: Secondary | ICD-10-CM | POA: Diagnosis not present

## 2023-10-01 DIAGNOSIS — M6281 Muscle weakness (generalized): Secondary | ICD-10-CM | POA: Diagnosis not present

## 2023-10-01 DIAGNOSIS — R2681 Unsteadiness on feet: Secondary | ICD-10-CM | POA: Diagnosis not present

## 2023-10-01 DIAGNOSIS — J189 Pneumonia, unspecified organism: Secondary | ICD-10-CM | POA: Diagnosis not present

## 2023-10-01 DIAGNOSIS — G20A1 Parkinson's disease without dyskinesia, without mention of fluctuations: Secondary | ICD-10-CM | POA: Diagnosis not present

## 2023-10-01 DIAGNOSIS — Z9181 History of falling: Secondary | ICD-10-CM | POA: Diagnosis not present

## 2023-10-01 DIAGNOSIS — J449 Chronic obstructive pulmonary disease, unspecified: Secondary | ICD-10-CM | POA: Diagnosis not present

## 2023-10-01 MED ORDER — CLONAZEPAM 0.5 MG PO TABS
ORAL_TABLET | ORAL | 0 refills | Status: DC
Start: 2023-10-01 — End: 2023-11-04

## 2023-10-02 DIAGNOSIS — R2681 Unsteadiness on feet: Secondary | ICD-10-CM | POA: Diagnosis not present

## 2023-10-02 DIAGNOSIS — G20A1 Parkinson's disease without dyskinesia, without mention of fluctuations: Secondary | ICD-10-CM | POA: Diagnosis not present

## 2023-10-02 DIAGNOSIS — I4891 Unspecified atrial fibrillation: Secondary | ICD-10-CM | POA: Diagnosis not present

## 2023-10-02 DIAGNOSIS — I5033 Acute on chronic diastolic (congestive) heart failure: Secondary | ICD-10-CM | POA: Diagnosis not present

## 2023-10-02 DIAGNOSIS — M6281 Muscle weakness (generalized): Secondary | ICD-10-CM | POA: Diagnosis not present

## 2023-10-02 DIAGNOSIS — R2689 Other abnormalities of gait and mobility: Secondary | ICD-10-CM | POA: Diagnosis not present

## 2023-10-02 DIAGNOSIS — J449 Chronic obstructive pulmonary disease, unspecified: Secondary | ICD-10-CM | POA: Diagnosis not present

## 2023-10-02 DIAGNOSIS — Z9181 History of falling: Secondary | ICD-10-CM | POA: Diagnosis not present

## 2023-10-02 DIAGNOSIS — J189 Pneumonia, unspecified organism: Secondary | ICD-10-CM | POA: Diagnosis not present

## 2023-10-03 DIAGNOSIS — M6281 Muscle weakness (generalized): Secondary | ICD-10-CM | POA: Diagnosis not present

## 2023-10-03 DIAGNOSIS — J449 Chronic obstructive pulmonary disease, unspecified: Secondary | ICD-10-CM | POA: Diagnosis not present

## 2023-10-03 DIAGNOSIS — R2689 Other abnormalities of gait and mobility: Secondary | ICD-10-CM | POA: Diagnosis not present

## 2023-10-03 DIAGNOSIS — G20A1 Parkinson's disease without dyskinesia, without mention of fluctuations: Secondary | ICD-10-CM | POA: Diagnosis not present

## 2023-10-03 DIAGNOSIS — R2681 Unsteadiness on feet: Secondary | ICD-10-CM | POA: Diagnosis not present

## 2023-10-03 DIAGNOSIS — J189 Pneumonia, unspecified organism: Secondary | ICD-10-CM | POA: Diagnosis not present

## 2023-10-03 DIAGNOSIS — I5033 Acute on chronic diastolic (congestive) heart failure: Secondary | ICD-10-CM | POA: Diagnosis not present

## 2023-10-03 DIAGNOSIS — I4891 Unspecified atrial fibrillation: Secondary | ICD-10-CM | POA: Diagnosis not present

## 2023-10-03 DIAGNOSIS — Z9181 History of falling: Secondary | ICD-10-CM | POA: Diagnosis not present

## 2023-10-04 DIAGNOSIS — M6281 Muscle weakness (generalized): Secondary | ICD-10-CM | POA: Diagnosis not present

## 2023-10-04 DIAGNOSIS — G20A1 Parkinson's disease without dyskinesia, without mention of fluctuations: Secondary | ICD-10-CM | POA: Diagnosis not present

## 2023-10-04 DIAGNOSIS — J189 Pneumonia, unspecified organism: Secondary | ICD-10-CM | POA: Diagnosis not present

## 2023-10-04 DIAGNOSIS — Z9181 History of falling: Secondary | ICD-10-CM | POA: Diagnosis not present

## 2023-10-04 DIAGNOSIS — I4891 Unspecified atrial fibrillation: Secondary | ICD-10-CM | POA: Diagnosis not present

## 2023-10-04 DIAGNOSIS — I5033 Acute on chronic diastolic (congestive) heart failure: Secondary | ICD-10-CM | POA: Diagnosis not present

## 2023-10-04 DIAGNOSIS — J449 Chronic obstructive pulmonary disease, unspecified: Secondary | ICD-10-CM | POA: Diagnosis not present

## 2023-10-04 DIAGNOSIS — R2689 Other abnormalities of gait and mobility: Secondary | ICD-10-CM | POA: Diagnosis not present

## 2023-10-04 DIAGNOSIS — R2681 Unsteadiness on feet: Secondary | ICD-10-CM | POA: Diagnosis not present

## 2023-10-07 DIAGNOSIS — R2689 Other abnormalities of gait and mobility: Secondary | ICD-10-CM | POA: Diagnosis not present

## 2023-10-07 DIAGNOSIS — I4891 Unspecified atrial fibrillation: Secondary | ICD-10-CM | POA: Diagnosis not present

## 2023-10-07 DIAGNOSIS — R2681 Unsteadiness on feet: Secondary | ICD-10-CM | POA: Diagnosis not present

## 2023-10-07 DIAGNOSIS — J189 Pneumonia, unspecified organism: Secondary | ICD-10-CM | POA: Diagnosis not present

## 2023-10-07 DIAGNOSIS — G20A1 Parkinson's disease without dyskinesia, without mention of fluctuations: Secondary | ICD-10-CM | POA: Diagnosis not present

## 2023-10-07 DIAGNOSIS — I5033 Acute on chronic diastolic (congestive) heart failure: Secondary | ICD-10-CM | POA: Diagnosis not present

## 2023-10-07 DIAGNOSIS — J449 Chronic obstructive pulmonary disease, unspecified: Secondary | ICD-10-CM | POA: Diagnosis not present

## 2023-10-07 DIAGNOSIS — M6281 Muscle weakness (generalized): Secondary | ICD-10-CM | POA: Diagnosis not present

## 2023-10-07 DIAGNOSIS — Z9181 History of falling: Secondary | ICD-10-CM | POA: Diagnosis not present

## 2023-10-08 DIAGNOSIS — Z9181 History of falling: Secondary | ICD-10-CM | POA: Diagnosis not present

## 2023-10-08 DIAGNOSIS — G20A1 Parkinson's disease without dyskinesia, without mention of fluctuations: Secondary | ICD-10-CM | POA: Diagnosis not present

## 2023-10-08 DIAGNOSIS — M6281 Muscle weakness (generalized): Secondary | ICD-10-CM | POA: Diagnosis not present

## 2023-10-08 DIAGNOSIS — I4891 Unspecified atrial fibrillation: Secondary | ICD-10-CM | POA: Diagnosis not present

## 2023-10-08 DIAGNOSIS — R2689 Other abnormalities of gait and mobility: Secondary | ICD-10-CM | POA: Diagnosis not present

## 2023-10-08 DIAGNOSIS — R2681 Unsteadiness on feet: Secondary | ICD-10-CM | POA: Diagnosis not present

## 2023-10-08 DIAGNOSIS — I5033 Acute on chronic diastolic (congestive) heart failure: Secondary | ICD-10-CM | POA: Diagnosis not present

## 2023-10-08 DIAGNOSIS — J449 Chronic obstructive pulmonary disease, unspecified: Secondary | ICD-10-CM | POA: Diagnosis not present

## 2023-10-08 DIAGNOSIS — J189 Pneumonia, unspecified organism: Secondary | ICD-10-CM | POA: Diagnosis not present

## 2023-10-09 ENCOUNTER — Encounter: Payer: Self-pay | Admitting: Adult Health

## 2023-10-09 ENCOUNTER — Non-Acute Institutional Stay (SKILLED_NURSING_FACILITY): Payer: Medicare PPO | Admitting: Adult Health

## 2023-10-09 DIAGNOSIS — M792 Neuralgia and neuritis, unspecified: Secondary | ICD-10-CM | POA: Diagnosis not present

## 2023-10-09 DIAGNOSIS — M6281 Muscle weakness (generalized): Secondary | ICD-10-CM | POA: Diagnosis not present

## 2023-10-09 DIAGNOSIS — Z96642 Presence of left artificial hip joint: Secondary | ICD-10-CM | POA: Diagnosis not present

## 2023-10-09 DIAGNOSIS — Z0189 Encounter for other specified special examinations: Secondary | ICD-10-CM | POA: Diagnosis not present

## 2023-10-09 DIAGNOSIS — R102 Pelvic and perineal pain: Secondary | ICD-10-CM

## 2023-10-09 DIAGNOSIS — I4891 Unspecified atrial fibrillation: Secondary | ICD-10-CM | POA: Diagnosis not present

## 2023-10-09 DIAGNOSIS — J449 Chronic obstructive pulmonary disease, unspecified: Secondary | ICD-10-CM | POA: Diagnosis not present

## 2023-10-09 DIAGNOSIS — I5033 Acute on chronic diastolic (congestive) heart failure: Secondary | ICD-10-CM | POA: Diagnosis not present

## 2023-10-09 DIAGNOSIS — G20A1 Parkinson's disease without dyskinesia, without mention of fluctuations: Secondary | ICD-10-CM | POA: Diagnosis not present

## 2023-10-09 DIAGNOSIS — G8929 Other chronic pain: Secondary | ICD-10-CM

## 2023-10-09 DIAGNOSIS — M25559 Pain in unspecified hip: Secondary | ICD-10-CM | POA: Diagnosis not present

## 2023-10-09 DIAGNOSIS — R2681 Unsteadiness on feet: Secondary | ICD-10-CM | POA: Diagnosis not present

## 2023-10-09 DIAGNOSIS — R2689 Other abnormalities of gait and mobility: Secondary | ICD-10-CM | POA: Diagnosis not present

## 2023-10-09 DIAGNOSIS — Z9181 History of falling: Secondary | ICD-10-CM | POA: Diagnosis not present

## 2023-10-09 DIAGNOSIS — J189 Pneumonia, unspecified organism: Secondary | ICD-10-CM | POA: Diagnosis not present

## 2023-10-09 NOTE — Progress Notes (Signed)
Location:  Other [place].  Nursing Home Room Number: [facility] [address]A Place of Service:  SNF ([phone]) Provider:  Kenard Gower, DNP, FNP-BC  Patient Care Team: Earnestine Mealing, MD as PCP - General (Family Medicine) Iran Ouch, MD as PCP - Cardiology (Cardiology) Sidney Ace, MD as Referring Physician (Allergy) Jerene Bears, MD as Consulting Physician (Gynecology) Tat, Octaviano Batty, DO as Consulting Physician (Neurology) Serena Croissant, MD as Consulting Physician (Hematology and Oncology) Tat, Octaviano Batty, DO as Consulting Physician (Neurology) Kathyrn Sheriff, [facility] (Inactive) as Pharmacist (Pharmacist)  Extended Emergency Contact Information Primary Emergency Contact: Elson Areas [place], [place] [address] [address] [place] Home Phone: [phone] Mobile Phone: [phone] Relation: Brother Secondary Emergency Contact: [employer] Phone: [phone] Mobile Phone: [phone] Relation: Niece Preferred language: English Interpreter needed? No  Code Status:  DNR  Goals of care: Advanced Directive information    10/09/2023    2:56 PM  Advanced Directives  Does Patient Have a Medical Advance Directive? Yes  Type of Estate agent of [place];Out of facility DNR (pink MOST or yellow form);Living will  Does patient want to make changes to medical advance directive? No - Patient declined  Copy of Healthcare Power of Attorney in Chart? Yes - validated most recent copy scanned in chart (See row information)     Chief Complaint  Patient presents with   Acute Visit    Pelvic Area Pain.     HPI:  Pt is a 82 y.o. female seen today for Pelvic Area Pain.She is a resident of [facility]. She complains of pain on her right pelvic area, area is tender to touch. No noted mass nor open wound. She denies trauma. She walks with a walker. She denies pain when urinating.  She takes Gabapentin for neuropathy.     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 10/2021 Cath: Nl cors.   Arthritis    Asthma    Breast cancer (HCC) 2002   infiltrative ductal carcinoma    Cataract 2019   Chronic heart failure with preserved ejection fraction (HFpEF) (HCC)    COPD (chronic obstructive pulmonary disease) (HCC) 07/2007   Coronary artery disease    Ductal carcinoma of breast, estrogen receptor positive, stage 1 (HCC) 09/16/2012   Endometriosis    GERD (gastroesophageal reflux disease)    Hx of mitral valve repair    Hypercalcemia 09/14/2013   Hypertension    Lesion of breast 1992   right, benign   Lung disease    secondary to [name] infection   Mastalgia 05/1994   Mitral regurgitation    a. 10/2021 RHC: PCWP V wave of 34; b. 01/2022 TEE: Severe flail/prolapse of large area of A2 scallop with severe posteriorly directed MR; c. 01/2022 TEER w /Mitraclip XTW x 2; d. 07/2022 Echo: EF 60-65%. No rwma, mild LVH, GrIII DD. Sev dil LA. Mod elev PASP. Mod-Sev MR. Mod MS.   Moderate mitral regurgitation by prior echocardiogram    Osteoporosis, post-menopausal 03/14/2012   Ovarian tumor of borderline malignancy, right 2004   Palpitations    a. 02/2022 Zio: Predominantly sinus rhythm at 67 (52-171).  2 runs of NSVT -fastest 9 beats at 167.  35 SVT runs-longest 19 beats, fastest 171x6 beats.   Pancreatitis    secondary  to Cholelithiasis   Parkinson disease (HCC) 02/10/2015   Post-thoracotomy pain syndrome    Pseudomonas pneumonia (HCC) 03/2008   Status post implantation of mitral valve leaflet clip 02/15/2022   s/p XTW MitraClip x2 by Dr. Excell Seltzer in the A2/P2: b. 04/03/23 s/p third mitraclip implantation.   Traumatic closed fracture of distal clavicle with minimal displacement, left, initial encounter 06/2021   [facility]   Uterine fibroid    Vitamin D deficiency    Past Surgical History:  Procedure Laterality Date    ABDOMINAL HYSTERECTOMY     & BSO for Mucinous borderline tumor of R ovary 2004   APPENDECTOMY  2004   BREAST BIOPSY  08/2004   right breast-benign   BREAST LUMPECTOMY  1992   benign   BUBBLE STUDY  12/27/2021   Procedure: BUBBLE STUDY;  Surgeon: Sande Rives, MD;  Location: [facility] ENDOSCOPY;  Service: Cardiovascular;;   CARDIAC CATHETERIZATION  2007   essentially negation for significant CAD   CARDIOVERSION N/A 09/05/2023   Procedure: CARDIOVERSION;  Surgeon: Debbe Odea, MD;  Location: [facility] ORS;  Service: Cardiovascular;  Laterality: N/A;   CATARACT EXTRACTION W/PHACO Left 07/24/2023   Procedure: CATARACT EXTRACTION PHACO AND INTRAOCULAR LENS PLACEMENT (IOC) LEFT 5.34 0037.3;  Surgeon: Lockie Mola, MD;  Location: [facility] SURGERY CNTR;  Service: Ophthalmology;  Laterality: Left;   CATARACT EXTRACTION W/PHACO Right 08/07/2023   Procedure: CATARACT EXTRACTION PHACO AND INTRAOCULAR LENS PLACEMENT (IOC) RIGHT 7.83 00:47.9;  Surgeon: Lockie Mola, MD;  Location: [facility] SURGERY CNTR;  Service: Ophthalmology;  Laterality: Right;   CHOLECYSTECTOMY  2001   COLONOSCOPY  2014   Dr Marina Goodell , due 2019   COLONOSCOPY  09/2018   2 TA removed, int hem, no f/u needed (Dr Marina Goodell)    ESOPHAGOGASTRODUODENOSCOPY  09/2018   GERD with esophagitis and stricture dilated, antral gastritis negative for H pylori Marina Goodell)   HIP ARTHROPLASTY Left 08/21/2022   Procedure: ARTHROPLASTY BIPOLAR HIP (HEMIARTHROPLASTY);  Surgeon: Juanell Fairly, MD;  Location: [facility] ORS;  Service: Orthopedics;  Laterality: Left;   LUNG BIOPSY  2002   [name], Dr Edwyna Shell   MASTECTOMY MODIFIED RADICAL Left 2002   oral chemotheraphy (tamoxifen then Armidex) no radiation, Dr.[name]   RIGHT/LEFT HEART CATH AND CORONARY ANGIOGRAPHY Bilateral 11/14/2021   Procedure: RIGHT/LEFT HEART CATH AND CORONARY ANGIOGRAPHY;  Surgeon: Iran Ouch, MD;  Location: [facility] INVASIVE CV LAB;  Service: Cardiovascular;  Laterality:  Bilateral;   TEE WITHOUT CARDIOVERSION N/A 12/29/2018   Procedure: TRANSESOPHAGEAL ECHOCARDIOGRAM (TEE);  Surgeon: Iran Ouch, MD;  Location: [facility] ORS;  Service: Cardiovascular;  Laterality: N/A;   TEE WITHOUT CARDIOVERSION N/A 12/27/2021   Procedure: TRANSESOPHAGEAL ECHOCARDIOGRAM (TEE);  Surgeon: Sande Rives, MD;  Location: [facility] ENDOSCOPY;  Service: Cardiovascular;  Laterality: N/A;   TEE WITHOUT CARDIOVERSION N/A 02/15/2022   Procedure: TRANSESOPHAGEAL ECHOCARDIOGRAM (TEE);  Surgeon: Tonny Bollman, MD;  Location: [facility] INVASIVE CV LAB;  Service: Cardiovascular;  Laterality: N/A;   TEE WITHOUT CARDIOVERSION N/A 05/14/2022   Procedure: TRANSESOPHAGEAL ECHOCARDIOGRAM (TEE);  Surgeon: Sande Rives, MD;  Location: [facility] ENDOSCOPY;  Service: Cardiovascular;  Laterality: N/A;   TEE WITHOUT CARDIOVERSION N/A 04/03/2023   Procedure: TRANSESOPHAGEAL ECHOCARDIOGRAM;  Surgeon: Tonny Bollman, MD;  Location: [facility] INVASIVE CV LAB;  Service: Cardiovascular;  Laterality: N/A;   TEE WITHOUT CARDIOVERSION N/A 09/05/2023   Procedure: TRANSESOPHAGEAL ECHOCARDIOGRAM;  Surgeon: Debbe Odea, MD;  Location: [facility] ORS;  Service: Cardiovascular;  Laterality: N/A;   TONSILLECTOMY  1946   TRANSCATHETER MITRAL EDGE TO EDGE REPAIR  N/A 02/15/2022   Procedure: MITRAL VALVE REPAIR;  Surgeon: Tonny Bollman, MD;  Location: [facility] INVASIVE CV LAB;  Service: Cardiovascular;  Laterality: N/A;   TRANSCATHETER MITRAL EDGE TO EDGE REPAIR N/A 04/03/2023   Procedure: MITRAL VALVE REPAIR;  Surgeon: Tonny Bollman, MD;  Location: [facility] INVASIVE CV LAB;  Service: Cardiovascular;  Laterality: N/A;    Allergies  Allergen Reactions   Sulfa Antibiotics Anaphylaxis   Sulfonamide Derivatives Anaphylaxis   Topamax [Topiramate] Other (See Comments)    Metabolic acidosis    Clarithromycin Other (See Comments)    pericarditis Other reaction(s): Not available   Hydrocodone Other (See Comments)    "hyper and climbing the walls"    Motrin [Ibuprofen] Other (See Comments)    headaches   Misc. Sulfonamide Containing Compounds     Other reaction(s): Not available   Symbicort [Budesonide-Formoterol Fumarate] Other (See Comments)    02/07/15 tremor   Tetracycline Hcl Other (See Comments)   Percocet [Oxycodone-Acetaminophen] Itching and Rash   Tape Itching and Rash    Use paper tape only   Tetracyclines & Related Other (See Comments)    "immediate yeast infection"    Outpatient Encounter Medications as of 10/09/2023  Medication Sig   acetaminophen (TYLENOL) 500 MG tablet Take 1,000 mg by mouth 3 (three) times daily.   albuterol (VENTOLIN HFA) 108 (90 Base) MCG/ACT inhaler Inhale 2 puffs into the lungs every 6 (six) hours as needed for wheezing or shortness of breath.   aluminum-magnesium hydroxide 200-200 MG/5ML suspension Take 5 mLs by mouth every 4 (four) hours as needed for indigestion.   amiodarone (PACERONE) 200 MG tablet Take 200 mg by mouth daily.   apixaban (ELIQUIS) 2.5 MG TABS tablet Take 1 tablet (2.5 mg total) by mouth 2 (two) times daily.   bisacodyl (DULCOLAX) 10 MG suppository Place 10 mg rectally as needed for moderate constipation.   calcium carbonate (TUMS - DOSED IN MG ELEMENTAL CALCIUM) 500 MG chewable tablet Chew 2 tablets by mouth daily as needed for indigestion or heartburn.   Calcium Carbonate Antacid 600 MG chewable tablet Chew 600 mg by mouth daily.   Carbidopa-Levodopa ER (SINEMET CR) 25-100 MG tablet controlled release TAKE TWO TABLET BY MOUTH AT NINE IN THE MORNING, ONE TAB AT ONE IN THE EVENING AND ONE TAB AT FIVE IN THE EVENING   Cholecalciferol (VITAMIN D3) 25 MCG (1000 UT) CAPS Take 1 capsule (1,000 Units total) by mouth daily.   clonazePAM (KLONOPIN) 0.5 MG tablet TAKE ONE HALF (1/2) A TABLET BY MOUTH AT BEDTIME   docusate sodium (COLACE) 100 MG capsule Take 100 mg by mouth 2 (two) times daily.   EPINEPHrine 0.3 mg/0.3 mL IJ SOAJ injection Inject 0.3 mg into the muscle as needed for  anaphylaxis.   Fexofenadine HCl (ALLEGRA PO) Take 180 mg by mouth daily at 8 pm.   Fluticasone-Umeclidin-Vilant (TRELEGY ELLIPTA) 100-62.5-25 MCG/ACT AEPB Inhale 1 puff into the lungs daily.   furosemide (LASIX) 40 MG tablet TAKE ONE TABLET BY MOUTH ONCE DAILY *NEW DOSE*   gabapentin ([name]) 300 MG capsule TAKE TWO CAPSULES BY MOUTH THREE TIMES DAILY   magnesium hydroxide (MILK OF MAGNESIA) 400 MG/5ML suspension Take 30 mLs by mouth daily as needed for mild constipation.   pantoprazole (PROTONIX) 20 MG tablet Take 1 tablet (20 mg total) by mouth daily.   polyethylene glycol (MIRALAX / GLYCOLAX) 17 g packet Take 17 g by mouth daily as needed.   No facility-administered encounter medications on file as of 10/09/2023.  Review of Systems  Constitutional:  Negative for appetite change, chills, fatigue and fever.  HENT:  Negative for congestion, hearing loss, rhinorrhea and sore throat.   Eyes: Negative.   Respiratory:  Negative for cough, shortness of breath and wheezing.   Cardiovascular:  Negative for chest pain, palpitations and leg swelling.  Gastrointestinal:  Negative for abdominal pain, constipation, diarrhea, nausea and vomiting.  Genitourinary:  Negative for dysuria.  Musculoskeletal:  Negative for arthralgias, back pain and myalgias.  Skin:  Negative for color change, rash and wound.  Neurological:  Negative for dizziness, weakness and headaches.  Psychiatric/Behavioral:  Negative for behavioral problems. The patient is not nervous/anxious.      Immunization History  Administered Date(s) Administered   Fluad Quad(high Dose 65+) 08/11/2019, 08/03/2020, 08/08/2021, 09/06/2022   Influenza Split 09/11/2011   Influenza Whole 09/09/2007, 08/08/2008, 08/15/2010, 08/26/2012   Influenza,inj,Quad PF,6+ Mos 08/18/2013, 08/20/2014, 08/25/2018   Influenza-Unspecified 09/27/2015, 08/26/2017, 09/18/2023   Moderna Covid-19 Fall Seasonal Vaccine 76yrs & older 10/25/2022   Moderna Covid-19  Vaccine Bivalent Booster 34yrs & up 04/24/2022, 03/05/2023   Moderna Sars-Covid-2 Vaccination 01/01/2020, 01/20/2020, 02/16/2020, 07/25/2020, 11/22/2020   PPD Test 08/24/2022   Pneumococcal Conjugate-13 02/12/2014   Pneumococcal Polysaccharide-23 11/26/2006, 10/21/2012, 11/22/2020, 11/21/2021   Rsv, Bivalent, Protein Subunit Rsvpref,pf Verdis Frederickson) 10/25/2022   Td 01/01/2011, 06/30/2021   Zoster Recombinant(Shingrix) 02/22/2018, 07/08/2019   Zoster, Live 05/26/2007   Pertinent  Health Maintenance Due  Topic Date Due   INFLUENZA VACCINE  Completed   DEXA SCAN  Completed   Colonoscopy  Discontinued      11/25/2022    8:30 PM 11/26/2022    9:30 AM 12/07/2022   11:43 AM 01/29/2023   10:32 AM 08/01/2023   10:29 AM  Fall Risk  Falls in the past year?   1 1 0  Was there an injury with Fall?   1 1 0  Fall Risk Category Calculator   3 3 0  Fall Risk Category (Retired)   High    (RETIRED) Patient Fall Risk Level High fall risk High fall risk     Fall risk Follow up     Falls evaluation completed     Vitals:   10/09/23 1447  BP: 138/72  Pulse: 62  Resp: 17  Temp: (!) 97.5 F (36.4 C)  SpO2: 97%  Weight: 120 lb 3.2 oz (54.5 kg)  Height: 5\' 2"  (1.575 m)   Body mass index is 21.98 kg/m.  Physical Exam Constitutional:      General: She is not in acute distress.    Appearance: Normal appearance.  HENT:     Head: Normocephalic and atraumatic.     Nose: Nose normal.     Mouth/Throat:     Mouth: Mucous membranes are moist.  Eyes:     Conjunctiva/sclera: Conjunctivae normal.  Cardiovascular:     Rate and Rhythm: Normal rate and regular rhythm.  Pulmonary:     Effort: Pulmonary effort is normal.     Breath sounds: Normal breath sounds.  Abdominal:     General: Bowel sounds are normal.     Palpations: Abdomen is soft.  Musculoskeletal:        General: Normal range of motion.     Cervical back: Normal range of motion.  Skin:    General: Skin is warm and dry.  Neurological:      General: No focal deficit present.     Mental Status: She is alert and oriented to person, place, and time.  Psychiatric:        Mood and Affect: Mood normal.        Behavior: Behavior normal.        Thought Content: Thought content normal.        Judgment: Judgment normal.        Labs reviewed: Recent Labs    11/23/22 0431 11/24/22 0432 11/25/22 0458 12/07/22 1223 04/01/23 0816 09/04/23 1057 09/05/23 0521 09/06/23 0345 09/07/23 0313 09/08/23 0417 09/16/23 0000  NA 140   < > 139 137   < >  --    < > 135 135 139 140  K 3.4*   < > 3.9 4.1   < >  --    < > 4.1 3.6 4.1 3.7  CL 101   < > 101 101   < >  --    < > 100 100 102 101  CO2 31   < > 30 28   < >  --    < > 23 26 26  31*  GLUCOSE 85   < > 94 87   < >  --    < > 149* 111* 84  --   BUN 17   < > 11 17   < >  --    < > 27* 27* 23 17  CREATININE 0.55   < > 0.57 0.79   < >  --    < > 1.07* 0.82 0.78 0.9  CALCIUM 7.8*   < > 8.3* 9.4   < >  --    < > 8.4* 7.8* 8.3* 9.1  MG 2.4   < > 2.4 2.2  --  2.1  --   --   --   --   --   PHOS 3.8  --  3.0 3.6  --   --   --   --   --   --   --    < > = values in this interval not displayed.   Recent Labs    04/01/23 0816 08/05/23 1114 09/05/23 0521  AST 20 13 21   ALT 5 2 14   ALKPHOS 67 98 80  BILITOT 2.0* 1.0 1.5*  PROT 6.9 7.6 6.7  ALBUMIN 3.8 3.7 3.4*   Recent Labs    09/06/23 0345 09/07/23 0313 09/08/23 0417 09/26/23 0000  WBC 5.2 6.2 6.0 3.8  NEUTROABS 4.5 4.8 4.3  --   HGB 11.9* 11.8* 12.7 12.4  HCT 36.4 36.8 38.9 39  MCV 101.1* 100.0 99.0  --   PLT 145* 140* 162 173   Lab Results  Component Value Date   TSH 1.145 09/07/2023   Lab Results  Component Value Date   HGBA1C 5.3 01/14/2018   Lab Results  Component Value Date   CHOL 171 08/08/2021   HDL 53.30 08/08/2021   LDLCALC 97 08/08/2021   LDLDIRECT 135.0 08/16/2009   TRIG 100.0 08/08/2021   CHOLHDL 3 08/08/2021    Significant Diagnostic Results in last 30 days:  No results  found.  Assessment/Plan  1. Pelvic pain -  right pelvic area is tender -  no trauma -  x-ray of pelvic area to rule out fracture -  UA with CS to rule out UTI  2. Chronic neuropathic pain -  continue Gabapentin   Family/ staff Communication: Discussed plan of care with resident and charge nurse.  Labs/tests ordered:  x-ray of pelvic area and UA with CS    Krue Peterka Medina-Vargas, DNP, MSN, FNP-BC [employer]  [facility] [phone] (Monday-Friday 8:00 a.m. - 5:00 p.m.) [phone] (after hours)

## 2023-10-10 DIAGNOSIS — I4891 Unspecified atrial fibrillation: Secondary | ICD-10-CM | POA: Diagnosis not present

## 2023-10-10 DIAGNOSIS — R2681 Unsteadiness on feet: Secondary | ICD-10-CM | POA: Diagnosis not present

## 2023-10-10 DIAGNOSIS — I5033 Acute on chronic diastolic (congestive) heart failure: Secondary | ICD-10-CM | POA: Diagnosis not present

## 2023-10-10 DIAGNOSIS — Z9181 History of falling: Secondary | ICD-10-CM | POA: Diagnosis not present

## 2023-10-10 DIAGNOSIS — J189 Pneumonia, unspecified organism: Secondary | ICD-10-CM | POA: Diagnosis not present

## 2023-10-10 DIAGNOSIS — G20A1 Parkinson's disease without dyskinesia, without mention of fluctuations: Secondary | ICD-10-CM | POA: Diagnosis not present

## 2023-10-10 DIAGNOSIS — R2689 Other abnormalities of gait and mobility: Secondary | ICD-10-CM | POA: Diagnosis not present

## 2023-10-10 DIAGNOSIS — M6281 Muscle weakness (generalized): Secondary | ICD-10-CM | POA: Diagnosis not present

## 2023-10-10 DIAGNOSIS — J449 Chronic obstructive pulmonary disease, unspecified: Secondary | ICD-10-CM | POA: Diagnosis not present

## 2023-10-11 DIAGNOSIS — R2689 Other abnormalities of gait and mobility: Secondary | ICD-10-CM | POA: Diagnosis not present

## 2023-10-11 DIAGNOSIS — I5033 Acute on chronic diastolic (congestive) heart failure: Secondary | ICD-10-CM | POA: Diagnosis not present

## 2023-10-11 DIAGNOSIS — M6281 Muscle weakness (generalized): Secondary | ICD-10-CM | POA: Diagnosis not present

## 2023-10-11 DIAGNOSIS — J449 Chronic obstructive pulmonary disease, unspecified: Secondary | ICD-10-CM | POA: Diagnosis not present

## 2023-10-11 DIAGNOSIS — I4891 Unspecified atrial fibrillation: Secondary | ICD-10-CM | POA: Diagnosis not present

## 2023-10-11 DIAGNOSIS — R2681 Unsteadiness on feet: Secondary | ICD-10-CM | POA: Diagnosis not present

## 2023-10-11 DIAGNOSIS — G20A1 Parkinson's disease without dyskinesia, without mention of fluctuations: Secondary | ICD-10-CM | POA: Diagnosis not present

## 2023-10-11 DIAGNOSIS — J189 Pneumonia, unspecified organism: Secondary | ICD-10-CM | POA: Diagnosis not present

## 2023-10-11 DIAGNOSIS — Z9181 History of falling: Secondary | ICD-10-CM | POA: Diagnosis not present

## 2023-10-12 DIAGNOSIS — R2689 Other abnormalities of gait and mobility: Secondary | ICD-10-CM | POA: Diagnosis not present

## 2023-10-12 DIAGNOSIS — J449 Chronic obstructive pulmonary disease, unspecified: Secondary | ICD-10-CM | POA: Diagnosis not present

## 2023-10-12 DIAGNOSIS — Z9181 History of falling: Secondary | ICD-10-CM | POA: Diagnosis not present

## 2023-10-12 DIAGNOSIS — M6281 Muscle weakness (generalized): Secondary | ICD-10-CM | POA: Diagnosis not present

## 2023-10-12 DIAGNOSIS — I4891 Unspecified atrial fibrillation: Secondary | ICD-10-CM | POA: Diagnosis not present

## 2023-10-12 DIAGNOSIS — R2681 Unsteadiness on feet: Secondary | ICD-10-CM | POA: Diagnosis not present

## 2023-10-12 DIAGNOSIS — G20A1 Parkinson's disease without dyskinesia, without mention of fluctuations: Secondary | ICD-10-CM | POA: Diagnosis not present

## 2023-10-12 DIAGNOSIS — I5033 Acute on chronic diastolic (congestive) heart failure: Secondary | ICD-10-CM | POA: Diagnosis not present

## 2023-10-12 DIAGNOSIS — J189 Pneumonia, unspecified organism: Secondary | ICD-10-CM | POA: Diagnosis not present

## 2023-10-14 DIAGNOSIS — J189 Pneumonia, unspecified organism: Secondary | ICD-10-CM | POA: Diagnosis not present

## 2023-10-14 DIAGNOSIS — G20A1 Parkinson's disease without dyskinesia, without mention of fluctuations: Secondary | ICD-10-CM | POA: Diagnosis not present

## 2023-10-14 DIAGNOSIS — I5033 Acute on chronic diastolic (congestive) heart failure: Secondary | ICD-10-CM | POA: Diagnosis not present

## 2023-10-14 DIAGNOSIS — Z9181 History of falling: Secondary | ICD-10-CM | POA: Diagnosis not present

## 2023-10-14 DIAGNOSIS — M6281 Muscle weakness (generalized): Secondary | ICD-10-CM | POA: Diagnosis not present

## 2023-10-14 DIAGNOSIS — R2689 Other abnormalities of gait and mobility: Secondary | ICD-10-CM | POA: Diagnosis not present

## 2023-10-14 DIAGNOSIS — J449 Chronic obstructive pulmonary disease, unspecified: Secondary | ICD-10-CM | POA: Diagnosis not present

## 2023-10-14 DIAGNOSIS — R2681 Unsteadiness on feet: Secondary | ICD-10-CM | POA: Diagnosis not present

## 2023-10-14 DIAGNOSIS — I4891 Unspecified atrial fibrillation: Secondary | ICD-10-CM | POA: Diagnosis not present

## 2023-10-15 ENCOUNTER — Non-Acute Institutional Stay (SKILLED_NURSING_FACILITY): Payer: Medicare PPO | Admitting: Nurse Practitioner

## 2023-10-15 ENCOUNTER — Encounter: Payer: Self-pay | Admitting: Nurse Practitioner

## 2023-10-15 DIAGNOSIS — J300 Vasomotor rhinitis: Secondary | ICD-10-CM

## 2023-10-15 DIAGNOSIS — I10 Essential (primary) hypertension: Secondary | ICD-10-CM | POA: Diagnosis not present

## 2023-10-15 DIAGNOSIS — Z66 Do not resuscitate: Secondary | ICD-10-CM | POA: Diagnosis not present

## 2023-10-15 DIAGNOSIS — G20A1 Parkinson's disease without dyskinesia, without mention of fluctuations: Secondary | ICD-10-CM

## 2023-10-15 DIAGNOSIS — K219 Gastro-esophageal reflux disease without esophagitis: Secondary | ICD-10-CM

## 2023-10-15 DIAGNOSIS — J449 Chronic obstructive pulmonary disease, unspecified: Secondary | ICD-10-CM | POA: Diagnosis not present

## 2023-10-15 DIAGNOSIS — I4891 Unspecified atrial fibrillation: Secondary | ICD-10-CM

## 2023-10-15 DIAGNOSIS — R2689 Other abnormalities of gait and mobility: Secondary | ICD-10-CM | POA: Diagnosis not present

## 2023-10-15 DIAGNOSIS — I5033 Acute on chronic diastolic (congestive) heart failure: Secondary | ICD-10-CM | POA: Diagnosis not present

## 2023-10-15 DIAGNOSIS — R2681 Unsteadiness on feet: Secondary | ICD-10-CM | POA: Diagnosis not present

## 2023-10-15 DIAGNOSIS — M6281 Muscle weakness (generalized): Secondary | ICD-10-CM | POA: Diagnosis not present

## 2023-10-15 DIAGNOSIS — J189 Pneumonia, unspecified organism: Secondary | ICD-10-CM | POA: Diagnosis not present

## 2023-10-15 DIAGNOSIS — Z9181 History of falling: Secondary | ICD-10-CM | POA: Diagnosis not present

## 2023-10-15 DIAGNOSIS — J4489 Other specified chronic obstructive pulmonary disease: Secondary | ICD-10-CM

## 2023-10-15 DIAGNOSIS — F514 Sleep terrors [night terrors]: Secondary | ICD-10-CM | POA: Diagnosis not present

## 2023-10-15 NOTE — Progress Notes (Signed)
Location:  Other [facility]) Nursing Home Room Number: 516 A Place of Service:  SNF (31)  Camiah Humm K. Janyth Contes, NP   Patient Care Team: Earnestine Mealing, MD as PCP - General (Family Medicine) Iran Ouch, MD as PCP - Cardiology (Cardiology) Sidney Ace, MD as Referring Physician (Allergy) Jerene Bears, MD as Consulting Physician (Gynecology) Tat, Octaviano Batty, DO as Consulting Physician (Neurology) Serena Croissant, MD as Consulting Physician (Hematology and Oncology) Tat, Octaviano Batty, DO as Consulting Physician (Neurology) Kathyrn Sheriff, [facility] (Inactive) as Pharmacist (Pharmacist)  Extended Emergency Contact Information Primary Emergency Contact: Elson Areas [place], [place] [address] D[place] of [place] Home Phone: [phone] Mobile Phone: [phone] Relation: Brother Secondary Emergency Contact: [facility] Phone: [phone] Mobile Phone: [phone] Relation: Niece Preferred language: English Interpreter needed? No  Goals of care: Advanced Directive information    10/15/2023    2:05 PM  Advanced Directives  Does Patient Have a Medical Advance Directive? Yes  Type of Estate agent of [place];Out of facility DNR (pink MOST or yellow form);Living will  Does patient want to make changes to medical advance directive? No - Patient declined  Copy of Healthcare Power of Attorney in Chart? Yes - validated most recent copy scanned in chart (See row information)  Pre-existing out of facility DNR order (yellow form or pink MOST form) Yellow form placed in chart (order not valid for inpatient use);Pink MOST form placed in chart (order not valid for inpatient use)     Chief Complaint  Patient presents with   Medical Management of Chronic Issues    Routine visit. Discuss need for covid boosters. Patient c/o cough and congestion     HPI:  Pt is a 82 y.o. female seen today for medical management of chronic disease.   Pt  reports she woke up in the middle of the night and had a deep cough. She has not been coughing that much today /this afternoon.  Reports she is concerned she will have pneumonia has she has had in the past.  Does feel like she has some chest congestion.   No nasal congestion but lots of dripping. When she was eating the other day it was clear water dripping out of her nose.   Constipation- bowels moving well at this time. No increase in constipation   A fib- on amiodarone for rate control.   On eliquis for anticoagulation  No falls since she has been in skilled  With her parkinson she gets night terrors and klonopin helps control these symptoms. If she does not have medication she will wake up screaming and try to fight someone.    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 10/2021 Cath: Nl cors.   Arthritis    Asthma    Breast cancer (HCC) 2002   infiltrative ductal carcinoma    Cataract 2019   Chronic heart failure with preserved ejection fraction (HFpEF) (HCC)    COPD (chronic obstructive pulmonary disease) (HCC) 07/2007   Coronary artery disease    Ductal carcinoma of breast, estrogen receptor positive, stage 1 (HCC) 09/16/2012   Endometriosis    GERD (gastroesophageal reflux disease)    Hx of mitral valve repair    Hypercalcemia 09/14/2013   Hypertension    Lesion of breast 1992   right, benign  Lung disease    secondary to [name] infection   Mastalgia 05/1994   Mitral regurgitation    a. 10/2021 RHC: PCWP V wave of 34; b. 01/2022 TEE: Severe flail/prolapse of large area of A2 scallop with severe posteriorly directed MR; c. 01/2022 TEER w /Mitraclip XTW x 2; d. 07/2022 Echo: EF 60-65%. No rwma, mild LVH, GrIII DD. Sev dil LA. Mod elev PASP. Mod-Sev MR. Mod MS.   Moderate mitral regurgitation by prior echocardiogram    Osteoporosis, post-menopausal 03/14/2012   Ovarian tumor of  borderline malignancy, right 2004   Palpitations    a. 02/2022 Zio: Predominantly sinus rhythm at 67 (52-171).  2 runs of NSVT -fastest 9 beats at 167.  35 SVT runs-longest 19 beats, fastest 171x6 beats.   Pancreatitis    secondary to Cholelithiasis   Parkinson disease (HCC) 02/10/2015   Post-thoracotomy pain syndrome    Pseudomonas pneumonia (HCC) 03/2008   Status post implantation of mitral valve leaflet clip 02/15/2022   s/p XTW MitraClip x2 by Dr. Excell Seltzer in the A2/P2: b. 04/03/23 s/p third mitraclip implantation.   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   APPENDECTOMY  2004   BREAST BIOPSY  08/2004   right breast-benign   BREAST LUMPECTOMY  1992   benign   BUBBLE STUDY  12/27/2021   Procedure: BUBBLE STUDY;  Surgeon: Sande Rives, MD;  Location: [facility] ENDOSCOPY;  Service: Cardiovascular;;   CARDIAC CATHETERIZATION  2007   essentially negation for significant CAD   CARDIOVERSION N/A 09/05/2023   Procedure: CARDIOVERSION;  Surgeon: Debbe Odea, MD;  Location: [facility] ORS;  Service: Cardiovascular;  Laterality: N/A;   CATARACT EXTRACTION W/PHACO Left 07/24/2023   Procedure: CATARACT EXTRACTION PHACO AND INTRAOCULAR LENS PLACEMENT (IOC) LEFT 5.34 0037.3;  Surgeon: Lockie Mola, MD;  Location: [facility] SURGERY CNTR;  Service: Ophthalmology;  Laterality: Left;   CATARACT EXTRACTION W/PHACO Right 08/07/2023   Procedure: CATARACT EXTRACTION PHACO AND INTRAOCULAR LENS PLACEMENT (IOC) RIGHT 7.83 00:47.9;  Surgeon: Lockie Mola, MD;  Location: [facility] SURGERY [facility];  Service: Ophthalmology;  Laterality: Right;   CHOLECYSTECTOMY  2001   COLONOSCOPY  2014   Dr Marina Goodell , due 2019   COLONOSCOPY  09/2018   2 TA removed, int  hem, no f/u needed (Dr Marina Goodell)    ESOPHAGOGASTRODUODENOSCOPY  09/2018   GERD with esophagitis and stricture dilated, antral gastritis negative for H pylori Marina Goodell)   HIP ARTHROPLASTY Left 08/21/2022   Procedure: ARTHROPLASTY BIPOLAR HIP (HEMIARTHROPLASTY);  Surgeon: Juanell Fairly, MD;  Location: [facility] ORS;  Service: Orthopedics;  Laterality: Left;   LUNG BIOPSY  2002   [name], Dr Edwyna Shell   MASTECTOMY MODIFIED RADICAL Left 2002   oral chemotheraphy (tamoxifen then Armidex) no radiation, Dr.[name]   RIGHT/LEFT HEART CATH AND CORONARY ANGIOGRAPHY Bilateral 11/14/2021   Procedure: RIGHT/LEFT HEART CATH AND CORONARY ANGIOGRAPHY;  Surgeon: Iran Ouch, MD;  Location: [facility] INVASIVE CV LAB;  Service: Cardiovascular;  Laterality: Bilateral;   TEE WITHOUT CARDIOVERSION N/A 12/29/2018   Procedure: TRANSESOPHAGEAL ECHOCARDIOGRAM (TEE);  Surgeon: Iran Ouch, MD;  Location: [facility] ORS;  Service: Cardiovascular;  Laterality: N/A;   TEE WITHOUT CARDIOVERSION N/A 12/27/2021   Procedure: TRANSESOPHAGEAL ECHOCARDIOGRAM (TEE);  Surgeon: Sande Rives, MD;  Location: [facility] ENDOSCOPY;  Service: Cardiovascular;  Laterality: N/A;   TEE WITHOUT CARDIOVERSION N/A 02/15/2022   Procedure: TRANSESOPHAGEAL ECHOCARDIOGRAM (TEE);  Surgeon: Tonny Bollman, MD;  Location: [facility] INVASIVE CV LAB;  Service: Cardiovascular;  Laterality: N/A;   TEE WITHOUT CARDIOVERSION N/A 05/14/2022   Procedure: TRANSESOPHAGEAL ECHOCARDIOGRAM (TEE);  Surgeon: Sande Rives, MD;  Location: [facility] ENDOSCOPY;  Service: Cardiovascular;  Laterality: N/A;   TEE WITHOUT CARDIOVERSION N/A 04/03/2023   Procedure: TRANSESOPHAGEAL ECHOCARDIOGRAM;  Surgeon: Tonny Bollman, MD;  Location: [facility] INVASIVE CV LAB;  Service: Cardiovascular;  Laterality: N/A;   TEE WITHOUT CARDIOVERSION N/A 09/05/2023   Procedure: TRANSESOPHAGEAL ECHOCARDIOGRAM;  Surgeon: Debbe Odea, MD;  Location: [facility] ORS;  Service: Cardiovascular;  Laterality: N/A;    TONSILLECTOMY  1946   TRANSCATHETER MITRAL EDGE TO EDGE REPAIR N/A 02/15/2022   Procedure: MITRAL VALVE REPAIR;  Surgeon: Tonny Bollman, MD;  Location: [facility] INVASIVE CV LAB;  Service: Cardiovascular;  Laterality: N/A;   TRANSCATHETER MITRAL EDGE TO EDGE REPAIR N/A 04/03/2023   Procedure: MITRAL VALVE REPAIR;  Surgeon: Tonny Bollman, MD;  Location: [facility] INVASIVE CV LAB;  Service: Cardiovascular;  Laterality: N/A;    Allergies  Allergen Reactions   Sulfa Antibiotics Anaphylaxis   Sulfonamide Derivatives Anaphylaxis   Topamax [Topiramate] Other (See Comments)    Metabolic acidosis    Clarithromycin Other (See Comments)    pericarditis Other reaction(s): Not available   Hydrocodone Other (See Comments)    "hyper and climbing the walls"   Motrin [Ibuprofen] Other (See Comments)    headaches   Misc. Sulfonamide Containing Compounds     Other reaction(s): Not available   Symbicort [Budesonide-Formoterol Fumarate] Other (See Comments)    02/07/15 tremor   Tetracycline Hcl Other (See Comments)   Percocet [Oxycodone-Acetaminophen] Itching and Rash   Tape Itching and Rash    Use paper tape only   Tetracyclines & Related Other (See Comments)    "immediate yeast infection"    Outpatient Encounter Medications as of 10/15/2023  Medication Sig   acetaminophen (TYLENOL) 500 MG tablet Take 1,000 mg by mouth 3 (three) times daily.   albuterol (VENTOLIN HFA) 108 (90 Base) MCG/ACT inhaler Inhale 2 puffs into the lungs every 6 (six) hours as needed for wheezing or shortness of breath.   aluminum-magnesium hydroxide 200-200 MG/5ML suspension Take 5 mLs by mouth every 4 (four) hours as needed for indigestion.   amiodarone (PACERONE) 200 MG tablet Take 200 mg by mouth daily.   apixaban (ELIQUIS) 2.5 MG TABS tablet Take 1 tablet (2.5 mg total) by mouth 2 (two) times daily.   bisacodyl (DULCOLAX) 10 MG suppository Place 10 mg rectally as needed for moderate constipation.   calcium carbonate (TUMS - DOSED IN  MG ELEMENTAL CALCIUM) 500 MG chewable tablet Chew 2 tablets by mouth daily as needed for indigestion or heartburn.   Calcium Carbonate Antacid 600 MG chewable tablet Chew 600 mg by mouth daily.   Carbidopa-Levodopa ER (SINEMET CR) 25-100 MG tablet controlled release TAKE TWO TABLET BY MOUTH AT NINE IN THE MORNING, ONE TAB AT ONE IN THE EVENING AND ONE TAB AT FIVE IN THE EVENING   Cholecalciferol (VITAMIN D3) 25 MCG (1000 UT) CAPS Take 1 capsule (1,000 Units total) by mouth daily.   clonazePAM (KLONOPIN) 0.5 MG tablet TAKE ONE HALF (1/2) A TABLET BY MOUTH AT BEDTIME   docusate sodium (COLACE) 100 MG capsule Take 100 mg by mouth 2 (two) times daily.   EPINEPHrine 0.3 mg/0.3 mL IJ SOAJ injection Inject  0.3 mg into the muscle as needed for anaphylaxis.   Fexofenadine HCl (ALLEGRA PO) Take 180 mg by mouth daily at 8 pm.   Fluticasone-Umeclidin-Vilant (TRELEGY ELLIPTA) 100-62.5-25 MCG/ACT AEPB Inhale 1 puff into the lungs daily.   furosemide (LASIX) 40 MG tablet TAKE ONE TABLET BY MOUTH ONCE DAILY *NEW DOSE*   gabapentin ([name]) 300 MG capsule TAKE TWO CAPSULES BY MOUTH THREE TIMES DAILY   magnesium hydroxide (MILK OF MAGNESIA) 400 MG/5ML suspension Take 30 mLs by mouth daily as needed for mild constipation.   pantoprazole (PROTONIX) 20 MG tablet Take 1 tablet (20 mg total) by mouth daily.   polyethylene glycol (MIRALAX / GLYCOLAX) 17 g packet Take 17 g by mouth daily as needed.   No facility-administered encounter medications on file as of 10/15/2023.    Review of Systems  Constitutional:  Negative for activity change, appetite change, fatigue and unexpected weight change.  HENT:  Negative for congestion and hearing loss.   Eyes: Negative.   Respiratory:  Positive for shortness of breath (slight increase in shortness of breath noted today). Negative for cough.   Cardiovascular:  Negative for chest pain, palpitations and leg swelling.  Gastrointestinal:  Negative for abdominal pain,  constipation and diarrhea.  Genitourinary:  Negative for difficulty urinating and dysuria.  Musculoskeletal:  Negative for arthralgias and myalgias.  Skin:  Negative for color change and wound.  Neurological:  Negative for dizziness and weakness.  Psychiatric/Behavioral:  Negative for agitation, behavioral problems and confusion.      Immunization History  Administered Date(s) Administered   Fluad Quad(high Dose 65+) 08/11/2019, 08/03/2020, 08/08/2021, 09/06/2022   Influenza Split 09/11/2011   Influenza Whole 09/09/2007, 08/08/2008, 08/15/2010, 08/26/2012   Influenza,inj,Quad PF,6+ Mos 08/18/2013, 08/20/2014, 08/25/2018   Influenza-Unspecified 09/27/2015, 08/26/2017, 09/18/2023   Moderna Covid-19 Fall Seasonal Vaccine 1yrs & older 10/25/2022   Moderna Covid-19 Vaccine Bivalent Booster 2yrs & up 04/24/2022, 03/05/2023   Moderna Sars-Covid-2 Vaccination 01/01/2020, 01/20/2020, 02/16/2020, 07/25/2020, 11/22/2020   PPD Test 08/24/2022   Pneumococcal Conjugate-13 02/12/2014   Pneumococcal Polysaccharide-23 11/26/2006, 10/21/2012, 11/22/2020, 11/21/2021   Rsv, Bivalent, Protein Subunit Rsvpref,pf Verdis Frederickson) 10/25/2022   Td 01/01/2011, 06/30/2021   Zoster Recombinant(Shingrix) 02/22/2018, 07/08/2019   Zoster, Live 05/26/2007   Pertinent  Health Maintenance Due  Topic Date Due   INFLUENZA VACCINE  Completed   DEXA SCAN  Completed   Colonoscopy  Discontinued      11/25/2022    8:30 PM 11/26/2022    9:30 AM 12/07/2022   11:43 AM 01/29/2023   10:32 AM 08/01/2023   10:29 AM  Fall Risk  Falls in the past year?   1 1 0  Was there an injury with Fall?   1 1 0  Fall Risk Category Calculator   3 3 0  Fall Risk Category (Retired)   High    (RETIRED) Patient Fall Risk Level High fall risk High fall risk     Fall risk Follow up     Falls evaluation completed   Functional Status Survey:    Vitals:   10/15/23 1404  BP: 138/72  Pulse: 62  Weight: 118 lb 12.8 oz (53.9 kg)  Height: 5\' 2"   (1.575 m)   Body mass index is 21.73 kg/m. Physical Exam Constitutional:      General: She is not in acute distress.    Appearance: She is well-developed. She is not diaphoretic.  HENT:     Head: Normocephalic and atraumatic.     Mouth/Throat:  Pharynx: No oropharyngeal exudate.  Eyes:     Conjunctiva/sclera: Conjunctivae normal.     Pupils: Pupils are equal, round, and reactive to light.  Cardiovascular:     Rate and Rhythm: Normal rate and regular rhythm.     Heart sounds: Normal heart sounds.  Pulmonary:     Effort: Pulmonary effort is normal.     Breath sounds: Normal breath sounds.  Abdominal:     General: Bowel sounds are normal.     Palpations: Abdomen is soft.  Musculoskeletal:     Cervical back: Normal range of motion and neck supple.     Right lower leg: No edema.     Left lower leg: No edema.  Skin:    General: Skin is warm and dry.  Neurological:     Mental Status: She is alert.     Motor: Weakness present.     Gait: Gait abnormal (uses walker).  Psychiatric:        Mood and Affect: Mood normal.     Labs reviewed: Recent Labs    11/23/22 0431 11/24/22 0432 11/25/22 0458 12/07/22 1223 04/01/23 0816 09/04/23 1057 09/05/23 0521 09/06/23 0345 09/07/23 0313 09/08/23 0417 09/16/23 0000  NA 140   < > 139 137   < >  --    < > 135 135 139 140  K 3.4*   < > 3.9 4.1   < >  --    < > 4.1 3.6 4.1 3.7  CL 101   < > 101 101   < >  --    < > 100 100 102 101  CO2 31   < > 30 28   < >  --    < > 23 26 26  31*  GLUCOSE 85   < > 94 87   < >  --    < > 149* 111* 84  --   BUN 17   < > 11 17   < >  --    < > 27* 27* 23 17  CREATININE 0.55   < > 0.57 0.79   < >  --    < > 1.07* 0.82 0.78 0.9  CALCIUM 7.8*   < > 8.3* 9.4   < >  --    < > 8.4* 7.8* 8.3* 9.1  MG 2.4   < > 2.4 2.2  --  2.1  --   --   --   --   --   PHOS 3.8  --  3.0 3.6  --   --   --   --   --   --   --    < > = values in this interval not displayed.   Recent Labs    04/01/23 0816 08/05/23 1114  09/05/23 0521  AST 20 13 21   ALT 5 2 14   ALKPHOS 67 98 80  BILITOT 2.0* 1.0 1.5*  PROT 6.9 7.6 6.7  ALBUMIN 3.8 3.7 3.4*   Recent Labs    09/06/23 0345 09/07/23 0313 09/08/23 0417 09/26/23 0000  WBC 5.2 6.2 6.0 3.8  NEUTROABS 4.5 4.8 4.3  --   HGB 11.9* 11.8* 12.7 12.4  HCT 36.4 36.8 38.9 39  MCV 101.1* 100.0 99.0  --   PLT 145* 140* 162 173   Lab Results  Component Value Date   TSH 1.145 09/07/2023   Lab Results  Component Value Date   HGBA1C 5.3 01/14/2018   Lab Results  Component Value  Date   CHOL 171 08/08/2021   HDL 53.30 08/08/2021   LDLCALC 97 08/08/2021   LDLDIRECT 135.0 08/16/2009   TRIG 100.0 08/08/2021   CHOLHDL 3 08/08/2021    Significant Diagnostic Results in last 30 days:  No results found.  Assessment/Plan 1. Parkinson's disease without dyskinesia or fluctuating manifestations (HCC) Symptoms stable on sinemet. Continue staff support and fall precautions.   2. Night terrors Controlled on clonazepam- reports she will not go to bed without it due to fear of terrors  3. Chronic obstructive airway disease with asthma (HCC) Had episode of coughing last night and this morning but better this afternoon. Has felt more congestion then her baseline and slightly more short of breath Sats have been stable.  Lungs CTA Continue to use albuterol PRN Start mucinex DM by mouth BID for 10 days with full glass of water Staff to monitor VS and notify for any changes Continues on albuterol PRN with trelegy daily   4. Gastroesophageal reflux disease without esophagitis -controlled on protonix.   5. Essential hypertension -Blood pressure well controlled, goal bp <140/90 Continue current medications and dietary modifications follow metabolic panel  6. Atrial fibrillation, unspecified type (HCC) Rate controlled on amiodarone On eliquis for anticoagulation.   7. Vasomotor rhinitis Start zyrtec 10 mg daily  Stop [employer] flonase daily    [place]  K. Biagio Borg [facility] [phone]

## 2023-10-16 DIAGNOSIS — I5033 Acute on chronic diastolic (congestive) heart failure: Secondary | ICD-10-CM | POA: Diagnosis not present

## 2023-10-16 DIAGNOSIS — R2681 Unsteadiness on feet: Secondary | ICD-10-CM | POA: Diagnosis not present

## 2023-10-16 DIAGNOSIS — I4891 Unspecified atrial fibrillation: Secondary | ICD-10-CM | POA: Diagnosis not present

## 2023-10-16 DIAGNOSIS — G20A1 Parkinson's disease without dyskinesia, without mention of fluctuations: Secondary | ICD-10-CM | POA: Diagnosis not present

## 2023-10-16 DIAGNOSIS — Z9181 History of falling: Secondary | ICD-10-CM | POA: Diagnosis not present

## 2023-10-16 DIAGNOSIS — M6281 Muscle weakness (generalized): Secondary | ICD-10-CM | POA: Diagnosis not present

## 2023-10-16 DIAGNOSIS — J449 Chronic obstructive pulmonary disease, unspecified: Secondary | ICD-10-CM | POA: Diagnosis not present

## 2023-10-16 DIAGNOSIS — R2689 Other abnormalities of gait and mobility: Secondary | ICD-10-CM | POA: Diagnosis not present

## 2023-10-16 DIAGNOSIS — J189 Pneumonia, unspecified organism: Secondary | ICD-10-CM | POA: Diagnosis not present

## 2023-10-17 DIAGNOSIS — J449 Chronic obstructive pulmonary disease, unspecified: Secondary | ICD-10-CM | POA: Diagnosis not present

## 2023-10-17 DIAGNOSIS — G20A1 Parkinson's disease without dyskinesia, without mention of fluctuations: Secondary | ICD-10-CM | POA: Diagnosis not present

## 2023-10-17 DIAGNOSIS — I5033 Acute on chronic diastolic (congestive) heart failure: Secondary | ICD-10-CM | POA: Diagnosis not present

## 2023-10-17 DIAGNOSIS — I4891 Unspecified atrial fibrillation: Secondary | ICD-10-CM | POA: Diagnosis not present

## 2023-10-17 DIAGNOSIS — M6281 Muscle weakness (generalized): Secondary | ICD-10-CM | POA: Diagnosis not present

## 2023-10-17 DIAGNOSIS — Z9181 History of falling: Secondary | ICD-10-CM | POA: Diagnosis not present

## 2023-10-17 DIAGNOSIS — R2689 Other abnormalities of gait and mobility: Secondary | ICD-10-CM | POA: Diagnosis not present

## 2023-10-17 DIAGNOSIS — R2681 Unsteadiness on feet: Secondary | ICD-10-CM | POA: Diagnosis not present

## 2023-10-17 DIAGNOSIS — J189 Pneumonia, unspecified organism: Secondary | ICD-10-CM | POA: Diagnosis not present

## 2023-10-18 DIAGNOSIS — M6281 Muscle weakness (generalized): Secondary | ICD-10-CM | POA: Diagnosis not present

## 2023-10-18 DIAGNOSIS — I4891 Unspecified atrial fibrillation: Secondary | ICD-10-CM | POA: Diagnosis not present

## 2023-10-18 DIAGNOSIS — J449 Chronic obstructive pulmonary disease, unspecified: Secondary | ICD-10-CM | POA: Diagnosis not present

## 2023-10-18 DIAGNOSIS — R2681 Unsteadiness on feet: Secondary | ICD-10-CM | POA: Diagnosis not present

## 2023-10-18 DIAGNOSIS — Z9181 History of falling: Secondary | ICD-10-CM | POA: Diagnosis not present

## 2023-10-18 DIAGNOSIS — G20A1 Parkinson's disease without dyskinesia, without mention of fluctuations: Secondary | ICD-10-CM | POA: Diagnosis not present

## 2023-10-18 DIAGNOSIS — R2689 Other abnormalities of gait and mobility: Secondary | ICD-10-CM | POA: Diagnosis not present

## 2023-10-18 DIAGNOSIS — J189 Pneumonia, unspecified organism: Secondary | ICD-10-CM | POA: Diagnosis not present

## 2023-10-18 DIAGNOSIS — I5033 Acute on chronic diastolic (congestive) heart failure: Secondary | ICD-10-CM | POA: Diagnosis not present

## 2023-10-21 DIAGNOSIS — J449 Chronic obstructive pulmonary disease, unspecified: Secondary | ICD-10-CM | POA: Diagnosis not present

## 2023-10-21 DIAGNOSIS — Z9181 History of falling: Secondary | ICD-10-CM | POA: Diagnosis not present

## 2023-10-21 DIAGNOSIS — M6281 Muscle weakness (generalized): Secondary | ICD-10-CM | POA: Diagnosis not present

## 2023-10-21 DIAGNOSIS — I5033 Acute on chronic diastolic (congestive) heart failure: Secondary | ICD-10-CM | POA: Diagnosis not present

## 2023-10-21 DIAGNOSIS — G20A1 Parkinson's disease without dyskinesia, without mention of fluctuations: Secondary | ICD-10-CM | POA: Diagnosis not present

## 2023-10-21 DIAGNOSIS — R2681 Unsteadiness on feet: Secondary | ICD-10-CM | POA: Diagnosis not present

## 2023-10-21 DIAGNOSIS — J189 Pneumonia, unspecified organism: Secondary | ICD-10-CM | POA: Diagnosis not present

## 2023-10-21 DIAGNOSIS — I4891 Unspecified atrial fibrillation: Secondary | ICD-10-CM | POA: Diagnosis not present

## 2023-10-21 DIAGNOSIS — R2689 Other abnormalities of gait and mobility: Secondary | ICD-10-CM | POA: Diagnosis not present

## 2023-10-22 DIAGNOSIS — J449 Chronic obstructive pulmonary disease, unspecified: Secondary | ICD-10-CM | POA: Diagnosis not present

## 2023-10-22 DIAGNOSIS — M6281 Muscle weakness (generalized): Secondary | ICD-10-CM | POA: Diagnosis not present

## 2023-10-22 DIAGNOSIS — I5033 Acute on chronic diastolic (congestive) heart failure: Secondary | ICD-10-CM | POA: Diagnosis not present

## 2023-10-22 DIAGNOSIS — Z9181 History of falling: Secondary | ICD-10-CM | POA: Diagnosis not present

## 2023-10-22 DIAGNOSIS — J189 Pneumonia, unspecified organism: Secondary | ICD-10-CM | POA: Diagnosis not present

## 2023-10-22 DIAGNOSIS — I4891 Unspecified atrial fibrillation: Secondary | ICD-10-CM | POA: Diagnosis not present

## 2023-10-22 DIAGNOSIS — R2681 Unsteadiness on feet: Secondary | ICD-10-CM | POA: Diagnosis not present

## 2023-10-22 DIAGNOSIS — G20A1 Parkinson's disease without dyskinesia, without mention of fluctuations: Secondary | ICD-10-CM | POA: Diagnosis not present

## 2023-10-22 DIAGNOSIS — R2689 Other abnormalities of gait and mobility: Secondary | ICD-10-CM | POA: Diagnosis not present

## 2023-10-28 ENCOUNTER — Non-Acute Institutional Stay (SKILLED_NURSING_FACILITY): Payer: Medicare PPO | Admitting: Student

## 2023-10-28 DIAGNOSIS — G20A1 Parkinson's disease without dyskinesia, without mention of fluctuations: Secondary | ICD-10-CM | POA: Diagnosis not present

## 2023-10-28 DIAGNOSIS — I4891 Unspecified atrial fibrillation: Secondary | ICD-10-CM | POA: Diagnosis not present

## 2023-10-28 DIAGNOSIS — M6281 Muscle weakness (generalized): Secondary | ICD-10-CM | POA: Diagnosis not present

## 2023-10-28 DIAGNOSIS — J449 Chronic obstructive pulmonary disease, unspecified: Secondary | ICD-10-CM | POA: Diagnosis not present

## 2023-10-28 DIAGNOSIS — R2689 Other abnormalities of gait and mobility: Secondary | ICD-10-CM | POA: Diagnosis not present

## 2023-10-28 DIAGNOSIS — Z79899 Other long term (current) drug therapy: Secondary | ICD-10-CM | POA: Diagnosis not present

## 2023-10-28 DIAGNOSIS — R2681 Unsteadiness on feet: Secondary | ICD-10-CM | POA: Diagnosis not present

## 2023-10-28 DIAGNOSIS — J189 Pneumonia, unspecified organism: Secondary | ICD-10-CM | POA: Diagnosis not present

## 2023-10-28 DIAGNOSIS — I5033 Acute on chronic diastolic (congestive) heart failure: Secondary | ICD-10-CM | POA: Diagnosis not present

## 2023-10-28 DIAGNOSIS — Z9181 History of falling: Secondary | ICD-10-CM | POA: Diagnosis not present

## 2023-10-29 DIAGNOSIS — J189 Pneumonia, unspecified organism: Secondary | ICD-10-CM | POA: Diagnosis not present

## 2023-10-29 DIAGNOSIS — J449 Chronic obstructive pulmonary disease, unspecified: Secondary | ICD-10-CM | POA: Diagnosis not present

## 2023-10-29 DIAGNOSIS — G20A1 Parkinson's disease without dyskinesia, without mention of fluctuations: Secondary | ICD-10-CM | POA: Diagnosis not present

## 2023-10-29 DIAGNOSIS — M6281 Muscle weakness (generalized): Secondary | ICD-10-CM | POA: Diagnosis not present

## 2023-10-29 DIAGNOSIS — R2689 Other abnormalities of gait and mobility: Secondary | ICD-10-CM | POA: Diagnosis not present

## 2023-10-29 DIAGNOSIS — Z9181 History of falling: Secondary | ICD-10-CM | POA: Diagnosis not present

## 2023-10-29 DIAGNOSIS — I4891 Unspecified atrial fibrillation: Secondary | ICD-10-CM | POA: Diagnosis not present

## 2023-10-29 DIAGNOSIS — I5033 Acute on chronic diastolic (congestive) heart failure: Secondary | ICD-10-CM | POA: Diagnosis not present

## 2023-10-29 DIAGNOSIS — R2681 Unsteadiness on feet: Secondary | ICD-10-CM | POA: Diagnosis not present

## 2023-10-30 DIAGNOSIS — I4891 Unspecified atrial fibrillation: Secondary | ICD-10-CM | POA: Diagnosis not present

## 2023-10-30 DIAGNOSIS — M6281 Muscle weakness (generalized): Secondary | ICD-10-CM | POA: Diagnosis not present

## 2023-10-30 DIAGNOSIS — R2689 Other abnormalities of gait and mobility: Secondary | ICD-10-CM | POA: Diagnosis not present

## 2023-10-30 DIAGNOSIS — J189 Pneumonia, unspecified organism: Secondary | ICD-10-CM | POA: Diagnosis not present

## 2023-10-30 DIAGNOSIS — G20A1 Parkinson's disease without dyskinesia, without mention of fluctuations: Secondary | ICD-10-CM | POA: Diagnosis not present

## 2023-10-30 DIAGNOSIS — J449 Chronic obstructive pulmonary disease, unspecified: Secondary | ICD-10-CM | POA: Diagnosis not present

## 2023-10-30 DIAGNOSIS — R2681 Unsteadiness on feet: Secondary | ICD-10-CM | POA: Diagnosis not present

## 2023-10-30 DIAGNOSIS — Z9181 History of falling: Secondary | ICD-10-CM | POA: Diagnosis not present

## 2023-10-30 DIAGNOSIS — I5033 Acute on chronic diastolic (congestive) heart failure: Secondary | ICD-10-CM | POA: Diagnosis not present

## 2023-11-01 ENCOUNTER — Encounter: Payer: Self-pay | Admitting: Student

## 2023-11-01 NOTE — Progress Notes (Signed)
Location:  Other Nursing Home Room Number: 516A Place of Service:  SNF (31) Provider:  Ander Gaster, Benetta Spar, MD  Patient Care Team: Earnestine Mealing, MD as PCP - General (Family Medicine) Iran Ouch, MD as PCP - Cardiology (Cardiology) Sidney Ace, MD as Referring Physician (Allergy) Jerene Bears, MD as Consulting Physician (Gynecology) Tat, Octaviano Batty, DO as Consulting Physician (Neurology) Serena Croissant, MD as Consulting Physician (Hematology and Oncology) Tat, Octaviano Batty, DO as Consulting Physician (Neurology) Kathyrn Sheriff, [facility] (Inactive) as Pharmacist (Pharmacist)  Extended Emergency Contact Information Primary Emergency Contact: Elson Areas [place], [place] [address] [address] of [place] Home Phone: [phone] Mobile Phone: [phone] Relation: Brother Secondary Emergency Contact: [facility] Phone: [phone] Mobile Phone: [phone] Relation: Niece Preferred language: English Interpreter needed? No  Code Status:  DNR Goals of care: Advanced Directive information    10/15/2023    2:05 PM  Advanced Directives  Does Patient Have a Medical Advance Directive? Yes  Type of Estate agent of [place];Out of facility DNR (pink MOST or yellow form);Living will  Does patient want to make changes to medical advance directive? No - Patient declined  Copy of Healthcare Power of Attorney in Chart? Yes - validated most recent copy scanned in chart (See row information)  Pre-existing out of facility DNR order (yellow form or pink MOST form) Yellow form placed in chart (order not valid for inpatient use);Pink MOST form placed in chart (order not valid for inpatient use)     Chief Complaint  Patient presents with   Medication Management    HPI:  Pt is a 82 y.o. female seen today for an acute visit for concern of changes in gabapentin dosage.  Patient takes gabapentin for neuropathy and she has fear that she  will have increase in pain with the lower dose of medication.  Discussed that patient's creatinine clearance is at the level where deep dose reduction is recommended at this time.  Per pharmacy and medical literature if creatinine clearance changes, there is risk of increasing medication levels in the body to dangerous doses.  Also increased risk of further damage to the kidneys.  Patient states she would prefer to continue dosing at higher level given adequate pain control, however she understands.  She does endorse she would prefer to focus on pain control rather than any impact on longevity.  She would be okay with worsening kidney function if her pain is well-controlled.  She also states "not to be picky" but she would prefer to have a capsule version of Tylenol rather than the tablet.   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 10/2021 Cath: Nl cors.   Arthritis    Asthma    Breast cancer (HCC) 2002   infiltrative ductal carcinoma    Cataract 2019   Chronic heart failure with preserved ejection fraction (HFpEF) (HCC)    COPD (chronic obstructive pulmonary disease) (HCC) 07/2007   Coronary artery disease    Ductal carcinoma of breast, estrogen receptor positive, stage 1 (HCC) 09/16/2012   Endometriosis    GERD (gastroesophageal reflux disease)    Hx of mitral valve repair    Hypercalcemia 09/14/2013   Hypertension    Lesion of breast 1992   right, benign   Lung disease    secondary to  [name] infection   Mastalgia 05/1994   Mitral regurgitation    a. 10/2021 RHC: PCWP V wave of 34; b. 01/2022 TEE: Severe flail/prolapse of large area of A2 scallop with severe posteriorly directed MR; c. 01/2022 TEER w /Mitraclip XTW x 2; d. 07/2022 Echo: EF 60-65%. No rwma, mild LVH, GrIII DD. Sev dil LA. Mod elev PASP. Mod-Sev MR. Mod MS.   Moderate mitral regurgitation by prior echocardiogram    Osteoporosis,  post-menopausal 03/14/2012   Ovarian tumor of borderline malignancy, right 2004   Palpitations    a. 02/2022 Zio: Predominantly sinus rhythm at 67 (52-171).  2 runs of NSVT -fastest 9 beats at 167.  35 SVT runs-longest 19 beats, fastest 171x6 beats.   Pancreatitis    secondary to Cholelithiasis   Parkinson disease (HCC) 02/10/2015   Post-thoracotomy pain syndrome    Pseudomonas pneumonia (HCC) 03/2008   Status post implantation of mitral valve leaflet clip 02/15/2022   s/p XTW MitraClip x2 by Dr. Excell Seltzer in the A2/P2: b. 04/03/23 s/p third mitraclip implantation.   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   APPENDECTOMY  2004   BREAST BIOPSY  08/2004   right breast-benign   BREAST LUMPECTOMY  1992   benign   BUBBLE STUDY  12/27/2021   Procedure: BUBBLE STUDY;  Surgeon: Sande Rives, MD;  Location: [facility] ENDOSCOPY;  Service: Cardiovascular;;   CARDIAC CATHETERIZATION  2007   essentially negation for significant CAD   CARDIOVERSION N/A 09/05/2023   Procedure: CARDIOVERSION;  Surgeon: Debbe Odea, MD;  Location: [facility] ORS;  Service: Cardiovascular;  Laterality: N/A;   CATARACT EXTRACTION W/PHACO Left 07/24/2023   Procedure: CATARACT EXTRACTION PHACO AND INTRAOCULAR LENS PLACEMENT (IOC) LEFT 5.34 0037.3;  Surgeon: Lockie Mola, MD;  Location: [facility];  Service: Ophthalmology;  Laterality: Left;   CATARACT EXTRACTION W/PHACO Right 08/07/2023   Procedure: CATARACT EXTRACTION PHACO AND INTRAOCULAR LENS PLACEMENT (IOC) RIGHT 7.83 00:47.9;  Surgeon: Lockie Mola, MD;  Location: [facility]ERY [facility];  Service: Ophthalmology;  Laterality: Right;   CHOLECYSTECTOMY  2001   COLONOSCOPY  2014   Dr Marina Goodell , due 2019    COLONOSCOPY  09/2018   2 TA removed, int hem, no f/u needed (Dr Marina Goodell)    ESOPHAGOGASTRODUODENOSCOPY  09/2018   GERD with esophagitis and stricture dilated, antral gastritis negative for H pylori Marina Goodell)   HIP ARTHROPLASTY Left 08/21/2022   Procedure: ARTHROPLASTY BIPOLAR HIP (HEMIARTHROPLASTY);  Surgeon: Juanell Fairly, MD;  Location: [facility] ORS;  Service: Orthopedics;  Laterality: Left;   LUNG BIOPSY  2002   [name], Dr Edwyna Shell   MASTECTOMY MODIFIED RADICAL Left 2002   oral chemotheraphy (tamoxifen then Armidex) no radiation, Dr.[name]   RIGHT/LEFT HEART CATH AND CORONARY ANGIOGRAPHY Bilateral 11/14/2021   Procedure: RIGHT/LEFT HEART CATH AND CORONARY ANGIOGRAPHY;  Surgeon: Iran Ouch, MD;  Location: [facility] INVASIVE CV LAB;  Service: Cardiovascular;  Laterality: Bilateral;   TEE WITHOUT CARDIOVERSION N/A 12/29/2018   Procedure: TRANSESOPHAGEAL ECHOCARDIOGRAM (TEE);  Surgeon: Iran Ouch, MD;  Location: [facility] ORS;  Service: Cardiovascular;  Laterality: N/A;   TEE WITHOUT CARDIOVERSION N/A 12/27/2021   Procedure: TRANSESOPHAGEAL ECHOCARDIOGRAM (TEE);  Surgeon: Sande Rives, MD;  Location: [facility] ENDOSCOPY;  Service: Cardiovascular;  Laterality: N/A;   TEE WITHOUT CARDIOVERSION N/A 02/15/2022   Procedure: TRANSESOPHAGEAL ECHOCARDIOGRAM (TEE);  Surgeon: Tonny Bollman, MD;  Location: [facility] INVASIVE CV LAB;  Service: Cardiovascular;  Laterality: N/A;   TEE WITHOUT CARDIOVERSION N/A 05/14/2022   Procedure: TRANSESOPHAGEAL ECHOCARDIOGRAM (TEE);  Surgeon: Sande Rives, MD;  Location: [facility] ENDOSCOPY;  Service: Cardiovascular;  Laterality: N/A;   TEE WITHOUT CARDIOVERSION N/A 04/03/2023   Procedure: TRANSESOPHAGEAL ECHOCARDIOGRAM;  Surgeon: Tonny Bollman, MD;  Location: [facility] INVASIVE CV LAB;  Service: Cardiovascular;  Laterality: N/A;   TEE WITHOUT CARDIOVERSION N/A 09/05/2023   Procedure: TRANSESOPHAGEAL ECHOCARDIOGRAM;  Surgeon: Debbe Odea, MD;  Location: [facility] ORS;  Service:  Cardiovascular;  Laterality: N/A;   TONSILLECTOMY  1946   TRANSCATHETER MITRAL EDGE TO EDGE REPAIR N/A 02/15/2022   Procedure: MITRAL VALVE REPAIR;  Surgeon: Tonny Bollman, MD;  Location: [facility] INVASIVE CV LAB;  Service: Cardiovascular;  Laterality: N/A;   TRANSCATHETER MITRAL EDGE TO EDGE REPAIR N/A 04/03/2023   Procedure: MITRAL VALVE REPAIR;  Surgeon: Tonny Bollman, MD;  Location: [facility] INVASIVE CV LAB;  Service: Cardiovascular;  Laterality: N/A;    Allergies  Allergen Reactions   Sulfa Antibiotics Anaphylaxis   Sulfonamide Derivatives Anaphylaxis   Topamax [Topiramate] Other (See Comments)    Metabolic acidosis    Clarithromycin Other (See Comments)    pericarditis Other reaction(s): Not available   Hydrocodone Other (See Comments)    "hyper and climbing the walls"   Motrin [Ibuprofen] Other (See Comments)    headaches   Misc. Sulfonamide Containing Compounds     Other reaction(s): Not available   Symbicort [Budesonide-Formoterol Fumarate] Other (See Comments)    02/07/15 tremor   Tetracycline Hcl Other (See Comments)   Percocet [Oxycodone-Acetaminophen] Itching and Rash   Tape Itching and Rash    Use paper tape only   Tetracyclines & Related Other (See Comments)    "immediate yeast infection"    Outpatient Encounter Medications as of 10/28/2023  Medication Sig   fluticasone (FLONASE) 50 MCG/ACT nasal spray Place into both nostrils.   acetaminophen (TYLENOL) 500 MG tablet Take 1,000 mg by mouth 3 (three) times daily.   albuterol (VENTOLIN HFA) 108 (90 Base) MCG/ACT inhaler Inhale 2 puffs into the lungs every 6 (six) hours as needed for wheezing or shortness of breath.   aluminum-magnesium hydroxide 200-200 MG/5ML suspension Take 5 mLs by mouth every 4 (four) hours as needed for indigestion.   amiodarone (PACERONE) 200 MG tablet Take 200 mg by mouth daily.   apixaban (ELIQUIS) 2.5 MG TABS tablet Take 1 tablet (2.5 mg total) by mouth 2 (two) times daily.   bisacodyl (DULCOLAX) 10  MG suppository Place 10 mg rectally as needed for moderate constipation.   calcium carbonate (TUMS - DOSED IN MG ELEMENTAL CALCIUM) 500 MG chewable tablet Chew 2 tablets by mouth daily as needed for indigestion or heartburn.   Calcium Carbonate Antacid 600 MG chewable tablet Chew 600 mg by mouth daily.   Carbidopa-Levodopa ER (SINEMET CR) 25-100 MG tablet controlled release TAKE TWO TABLET BY MOUTH AT NINE IN THE MORNING, ONE TAB AT ONE IN THE EVENING AND ONE TAB AT FIVE IN THE EVENING   Cholecalciferol (VITAMIN D3) 25 MCG (1000 UT) CAPS Take 1 capsule (1,000 Units total) by mouth daily.   clonazePAM (KLONOPIN) 0.5 MG tablet TAKE ONE HALF (1/2) A TABLET BY MOUTH AT BEDTIME   docusate sodium (COLACE) 100 MG capsule Take 100 mg by mouth 2 (two) times daily.   EPINEPHrine 0.3 mg/0.3  mL IJ SOAJ injection Inject 0.3 mg into the muscle as needed for anaphylaxis.   Fexofenadine HCl (ALLEGRA PO) Take 180 mg by mouth daily at 8 pm.   Fluticasone-Umeclidin-Vilant (TRELEGY ELLIPTA) 100-62.5-25 MCG/ACT AEPB Inhale 1 puff into the lungs daily.   furosemide (LASIX) 40 MG tablet TAKE ONE TABLET BY MOUTH ONCE DAILY *NEW DOSE*   gabapentin ([name]) 300 MG capsule TAKE TWO CAPSULES BY MOUTH THREE TIMES DAILY   magnesium hydroxide (MILK OF MAGNESIA) 400 MG/5ML suspension Take 30 mLs by mouth daily as needed for mild constipation.   pantoprazole (PROTONIX) 20 MG tablet Take 1 tablet (20 mg total) by mouth daily.   polyethylene glycol (MIRALAX / GLYCOLAX) 17 g packet Take 17 g by mouth daily as needed.   No facility-administered encounter medications on file as of 10/28/2023.    Review of Systems  Immunization History  Administered Date(s) Administered   Fluad Quad(high Dose 65+) 08/11/2019, 08/03/2020, 08/08/2021, 09/06/2022   Influenza Split 09/11/2011   Influenza Whole 09/09/2007, 08/08/2008, 08/15/2010, 08/26/2012   Influenza,inj,Quad PF,6+ Mos 08/18/2013, 08/20/2014, 08/25/2018   Influenza-Unspecified  09/27/2015, 08/26/2017, 09/18/2023   Moderna Covid-19 Fall Seasonal Vaccine 1yrs & older 10/25/2022   Moderna Covid-19 Vaccine Bivalent Booster 49yrs & up 04/24/2022, 03/05/2023   Moderna Sars-Covid-2 Vaccination 01/01/2020, 01/20/2020, 02/16/2020, 07/25/2020, 11/22/2020   PPD Test 08/24/2022   Pneumococcal Conjugate-13 02/12/2014   Pneumococcal Polysaccharide-23 11/26/2006, 10/21/2012, 11/22/2020, 11/21/2021   Rsv, Bivalent, Protein Subunit Rsvpref,pf Verdis Frederickson) 10/25/2022   Td 01/01/2011, 06/30/2021   Zoster Recombinant(Shingrix) 02/22/2018, 07/08/2019   Zoster, Live 05/26/2007   Pertinent  Health Maintenance Due  Topic Date Due   INFLUENZA VACCINE  Completed   DEXA SCAN  Completed   Colonoscopy  Discontinued      11/25/2022    8:30 PM 11/26/2022    9:30 AM 12/07/2022   11:43 AM 01/29/2023   10:32 AM 08/01/2023   10:29 AM  Fall Risk  Falls in the past year?   1 1 0  Was there an injury with Fall?   1 1 0  Fall Risk Category Calculator   3 3 0  Fall Risk Category (Retired)   High    (RETIRED) Patient Fall Risk Level High fall risk High fall risk     Fall risk Follow up     Falls evaluation completed   Functional Status Survey:    There were no vitals filed for this visit. There is no height or weight on file to calculate BMI. Physical Exam  Labs reviewed: Recent Labs    11/23/22 0431 11/24/22 0432 11/25/22 0458 12/07/22 1223 04/01/23 0816 09/04/23 1057 09/05/23 0521 09/06/23 0345 09/07/23 0313 09/08/23 0417 09/16/23 0000  NA 140   < > 139 137   < >  --    < > 135 135 139 140  K 3.4*   < > 3.9 4.1   < >  --    < > 4.1 3.6 4.1 3.7  CL 101   < > 101 101   < >  --    < > 100 100 102 101  CO2 31   < > 30 28   < >  --    < > 23 26 26  31*  GLUCOSE 85   < > 94 87   < >  --    < > 149* 111* 84  --   BUN 17   < > 11 17   < >  --    < >  27* 27* 23 17  CREATININE 0.55   < > 0.57 0.79   < >  --    < > 1.07* 0.82 0.78 0.9  CALCIUM 7.8*   < > 8.3* 9.4   < >  --    < > 8.4*  7.8* 8.3* 9.1  MG 2.4   < > 2.4 2.2  --  2.1  --   --   --   --   --   PHOS 3.8  --  3.0 3.6  --   --   --   --   --   --   --    < > = values in this interval not displayed.   Recent Labs    04/01/23 0816 08/05/23 1114 09/05/23 0521  AST 20 13 21   ALT 5 2 14   ALKPHOS 67 98 80  BILITOT 2.0* 1.0 1.5*  PROT 6.9 7.6 6.7  ALBUMIN 3.8 3.7 3.4*   Recent Labs    09/06/23 0345 09/07/23 0313 09/08/23 0417 09/26/23 0000  WBC 5.2 6.2 6.0 3.8  NEUTROABS 4.5 4.8 4.3  --   HGB 11.9* 11.8* 12.7 12.4  HCT 36.4 36.8 38.9 39  MCV 101.1* 100.0 99.0  --   PLT 145* 140* 162 173   Lab Results  Component Value Date   TSH 1.145 09/07/2023   Lab Results  Component Value Date   HGBA1C 5.3 01/14/2018   Lab Results  Component Value Date   CHOL 171 08/08/2021   HDL 53.30 08/08/2021   LDLCALC 97 08/08/2021   LDLDIRECT 135.0 08/16/2009   TRIG 100.0 08/08/2021   CHOLHDL 3 08/08/2021    Significant Diagnostic Results in last 30 days:  No results found.  Assessment/Plan Medication management Engaged in extensive dialog with patient regarding creatinine clearance and medication recommendations for her safety. Patient states longevity is not her goal at this stage of life and comfort is, she would like to trial lower dose of gabapentin 600 mg BID for now,  but increase dose in a couple of weeks if she has no improvement. She would also like to make sure her tylenol is in capsules rather than the tablet.   Family/ staff Communication: nursing  Labs/tests ordered:  bmp in 2 weeks  I spent greater than 30 minutes for the care of this patient in face to face time, chart review, clinical documentation, patient education.

## 2023-11-04 ENCOUNTER — Other Ambulatory Visit: Payer: Self-pay | Admitting: Student

## 2023-11-04 DIAGNOSIS — G20A1 Parkinson's disease without dyskinesia, without mention of fluctuations: Secondary | ICD-10-CM

## 2023-11-04 DIAGNOSIS — Z9181 History of falling: Secondary | ICD-10-CM | POA: Diagnosis not present

## 2023-11-04 DIAGNOSIS — J449 Chronic obstructive pulmonary disease, unspecified: Secondary | ICD-10-CM | POA: Diagnosis not present

## 2023-11-04 DIAGNOSIS — R2689 Other abnormalities of gait and mobility: Secondary | ICD-10-CM | POA: Diagnosis not present

## 2023-11-04 DIAGNOSIS — I5033 Acute on chronic diastolic (congestive) heart failure: Secondary | ICD-10-CM | POA: Diagnosis not present

## 2023-11-04 DIAGNOSIS — R2681 Unsteadiness on feet: Secondary | ICD-10-CM | POA: Diagnosis not present

## 2023-11-04 DIAGNOSIS — M6281 Muscle weakness (generalized): Secondary | ICD-10-CM | POA: Diagnosis not present

## 2023-11-04 DIAGNOSIS — I4891 Unspecified atrial fibrillation: Secondary | ICD-10-CM | POA: Diagnosis not present

## 2023-11-04 DIAGNOSIS — J189 Pneumonia, unspecified organism: Secondary | ICD-10-CM | POA: Diagnosis not present

## 2023-11-04 MED ORDER — CLONAZEPAM 0.5 MG PO TABS
ORAL_TABLET | ORAL | 0 refills | Status: DC
Start: 2023-11-04 — End: 2023-12-02

## 2023-11-04 NOTE — Progress Notes (Signed)
Chronic insomnia medication

## 2023-11-05 DIAGNOSIS — R2681 Unsteadiness on feet: Secondary | ICD-10-CM | POA: Diagnosis not present

## 2023-11-05 DIAGNOSIS — Z9181 History of falling: Secondary | ICD-10-CM | POA: Diagnosis not present

## 2023-11-05 DIAGNOSIS — J189 Pneumonia, unspecified organism: Secondary | ICD-10-CM | POA: Diagnosis not present

## 2023-11-05 DIAGNOSIS — J449 Chronic obstructive pulmonary disease, unspecified: Secondary | ICD-10-CM | POA: Diagnosis not present

## 2023-11-05 DIAGNOSIS — G20A1 Parkinson's disease without dyskinesia, without mention of fluctuations: Secondary | ICD-10-CM | POA: Diagnosis not present

## 2023-11-05 DIAGNOSIS — I4891 Unspecified atrial fibrillation: Secondary | ICD-10-CM | POA: Diagnosis not present

## 2023-11-05 DIAGNOSIS — R2689 Other abnormalities of gait and mobility: Secondary | ICD-10-CM | POA: Diagnosis not present

## 2023-11-05 DIAGNOSIS — M6281 Muscle weakness (generalized): Secondary | ICD-10-CM | POA: Diagnosis not present

## 2023-11-05 DIAGNOSIS — I5033 Acute on chronic diastolic (congestive) heart failure: Secondary | ICD-10-CM | POA: Diagnosis not present

## 2023-11-06 DIAGNOSIS — I4891 Unspecified atrial fibrillation: Secondary | ICD-10-CM | POA: Diagnosis not present

## 2023-11-06 DIAGNOSIS — J449 Chronic obstructive pulmonary disease, unspecified: Secondary | ICD-10-CM | POA: Diagnosis not present

## 2023-11-06 DIAGNOSIS — J189 Pneumonia, unspecified organism: Secondary | ICD-10-CM | POA: Diagnosis not present

## 2023-11-06 DIAGNOSIS — G20A1 Parkinson's disease without dyskinesia, without mention of fluctuations: Secondary | ICD-10-CM | POA: Diagnosis not present

## 2023-11-06 DIAGNOSIS — I5033 Acute on chronic diastolic (congestive) heart failure: Secondary | ICD-10-CM | POA: Diagnosis not present

## 2023-11-06 DIAGNOSIS — R2689 Other abnormalities of gait and mobility: Secondary | ICD-10-CM | POA: Diagnosis not present

## 2023-11-06 DIAGNOSIS — R2681 Unsteadiness on feet: Secondary | ICD-10-CM | POA: Diagnosis not present

## 2023-11-06 DIAGNOSIS — Z9181 History of falling: Secondary | ICD-10-CM | POA: Diagnosis not present

## 2023-11-06 DIAGNOSIS — M6281 Muscle weakness (generalized): Secondary | ICD-10-CM | POA: Diagnosis not present

## 2023-11-07 DIAGNOSIS — R2681 Unsteadiness on feet: Secondary | ICD-10-CM | POA: Diagnosis not present

## 2023-11-07 DIAGNOSIS — I4891 Unspecified atrial fibrillation: Secondary | ICD-10-CM | POA: Diagnosis not present

## 2023-11-07 DIAGNOSIS — I5033 Acute on chronic diastolic (congestive) heart failure: Secondary | ICD-10-CM | POA: Diagnosis not present

## 2023-11-07 DIAGNOSIS — J449 Chronic obstructive pulmonary disease, unspecified: Secondary | ICD-10-CM | POA: Diagnosis not present

## 2023-11-07 DIAGNOSIS — R2689 Other abnormalities of gait and mobility: Secondary | ICD-10-CM | POA: Diagnosis not present

## 2023-11-07 DIAGNOSIS — J189 Pneumonia, unspecified organism: Secondary | ICD-10-CM | POA: Diagnosis not present

## 2023-11-07 DIAGNOSIS — Z9181 History of falling: Secondary | ICD-10-CM | POA: Diagnosis not present

## 2023-11-07 DIAGNOSIS — M6281 Muscle weakness (generalized): Secondary | ICD-10-CM | POA: Diagnosis not present

## 2023-11-07 DIAGNOSIS — G20A1 Parkinson's disease without dyskinesia, without mention of fluctuations: Secondary | ICD-10-CM | POA: Diagnosis not present

## 2023-11-08 DIAGNOSIS — M6281 Muscle weakness (generalized): Secondary | ICD-10-CM | POA: Diagnosis not present

## 2023-11-08 DIAGNOSIS — G20A1 Parkinson's disease without dyskinesia, without mention of fluctuations: Secondary | ICD-10-CM | POA: Diagnosis not present

## 2023-11-08 DIAGNOSIS — I4891 Unspecified atrial fibrillation: Secondary | ICD-10-CM | POA: Diagnosis not present

## 2023-11-08 DIAGNOSIS — I5033 Acute on chronic diastolic (congestive) heart failure: Secondary | ICD-10-CM | POA: Diagnosis not present

## 2023-11-08 DIAGNOSIS — J449 Chronic obstructive pulmonary disease, unspecified: Secondary | ICD-10-CM | POA: Diagnosis not present

## 2023-11-08 DIAGNOSIS — J189 Pneumonia, unspecified organism: Secondary | ICD-10-CM | POA: Diagnosis not present

## 2023-11-08 DIAGNOSIS — Z9181 History of falling: Secondary | ICD-10-CM | POA: Diagnosis not present

## 2023-11-08 DIAGNOSIS — R2681 Unsteadiness on feet: Secondary | ICD-10-CM | POA: Diagnosis not present

## 2023-11-08 DIAGNOSIS — R2689 Other abnormalities of gait and mobility: Secondary | ICD-10-CM | POA: Diagnosis not present

## 2023-11-09 DIAGNOSIS — M6281 Muscle weakness (generalized): Secondary | ICD-10-CM | POA: Diagnosis not present

## 2023-11-09 DIAGNOSIS — I5033 Acute on chronic diastolic (congestive) heart failure: Secondary | ICD-10-CM | POA: Diagnosis not present

## 2023-11-09 DIAGNOSIS — Z9181 History of falling: Secondary | ICD-10-CM | POA: Diagnosis not present

## 2023-11-09 DIAGNOSIS — G20A1 Parkinson's disease without dyskinesia, without mention of fluctuations: Secondary | ICD-10-CM | POA: Diagnosis not present

## 2023-11-09 DIAGNOSIS — R2689 Other abnormalities of gait and mobility: Secondary | ICD-10-CM | POA: Diagnosis not present

## 2023-11-09 DIAGNOSIS — J189 Pneumonia, unspecified organism: Secondary | ICD-10-CM | POA: Diagnosis not present

## 2023-11-09 DIAGNOSIS — J449 Chronic obstructive pulmonary disease, unspecified: Secondary | ICD-10-CM | POA: Diagnosis not present

## 2023-11-09 DIAGNOSIS — I4891 Unspecified atrial fibrillation: Secondary | ICD-10-CM | POA: Diagnosis not present

## 2023-11-09 DIAGNOSIS — R2681 Unsteadiness on feet: Secondary | ICD-10-CM | POA: Diagnosis not present

## 2023-11-11 DIAGNOSIS — J449 Chronic obstructive pulmonary disease, unspecified: Secondary | ICD-10-CM | POA: Diagnosis not present

## 2023-11-11 DIAGNOSIS — I4891 Unspecified atrial fibrillation: Secondary | ICD-10-CM | POA: Diagnosis not present

## 2023-11-11 DIAGNOSIS — Z9181 History of falling: Secondary | ICD-10-CM | POA: Diagnosis not present

## 2023-11-11 DIAGNOSIS — I5033 Acute on chronic diastolic (congestive) heart failure: Secondary | ICD-10-CM | POA: Diagnosis not present

## 2023-11-11 DIAGNOSIS — M6281 Muscle weakness (generalized): Secondary | ICD-10-CM | POA: Diagnosis not present

## 2023-11-11 DIAGNOSIS — R2681 Unsteadiness on feet: Secondary | ICD-10-CM | POA: Diagnosis not present

## 2023-11-11 DIAGNOSIS — J189 Pneumonia, unspecified organism: Secondary | ICD-10-CM | POA: Diagnosis not present

## 2023-11-11 DIAGNOSIS — G20A1 Parkinson's disease without dyskinesia, without mention of fluctuations: Secondary | ICD-10-CM | POA: Diagnosis not present

## 2023-11-11 DIAGNOSIS — R2689 Other abnormalities of gait and mobility: Secondary | ICD-10-CM | POA: Diagnosis not present

## 2023-11-12 DIAGNOSIS — Z9181 History of falling: Secondary | ICD-10-CM | POA: Diagnosis not present

## 2023-11-12 DIAGNOSIS — R2681 Unsteadiness on feet: Secondary | ICD-10-CM | POA: Diagnosis not present

## 2023-11-12 DIAGNOSIS — I5033 Acute on chronic diastolic (congestive) heart failure: Secondary | ICD-10-CM | POA: Diagnosis not present

## 2023-11-12 DIAGNOSIS — J189 Pneumonia, unspecified organism: Secondary | ICD-10-CM | POA: Diagnosis not present

## 2023-11-12 DIAGNOSIS — M6281 Muscle weakness (generalized): Secondary | ICD-10-CM | POA: Diagnosis not present

## 2023-11-12 DIAGNOSIS — G20A1 Parkinson's disease without dyskinesia, without mention of fluctuations: Secondary | ICD-10-CM | POA: Diagnosis not present

## 2023-11-12 DIAGNOSIS — R2689 Other abnormalities of gait and mobility: Secondary | ICD-10-CM | POA: Diagnosis not present

## 2023-11-12 DIAGNOSIS — J449 Chronic obstructive pulmonary disease, unspecified: Secondary | ICD-10-CM | POA: Diagnosis not present

## 2023-11-12 DIAGNOSIS — I4891 Unspecified atrial fibrillation: Secondary | ICD-10-CM | POA: Diagnosis not present

## 2023-11-13 ENCOUNTER — Non-Acute Institutional Stay (SKILLED_NURSING_FACILITY): Payer: Self-pay | Admitting: Student

## 2023-11-13 ENCOUNTER — Encounter: Payer: Self-pay | Admitting: Student

## 2023-11-13 DIAGNOSIS — M6281 Muscle weakness (generalized): Secondary | ICD-10-CM | POA: Diagnosis not present

## 2023-11-13 DIAGNOSIS — I5033 Acute on chronic diastolic (congestive) heart failure: Secondary | ICD-10-CM | POA: Diagnosis not present

## 2023-11-13 DIAGNOSIS — Z66 Do not resuscitate: Secondary | ICD-10-CM | POA: Diagnosis not present

## 2023-11-13 DIAGNOSIS — I10 Essential (primary) hypertension: Secondary | ICD-10-CM | POA: Diagnosis not present

## 2023-11-13 DIAGNOSIS — J4489 Other specified chronic obstructive pulmonary disease: Secondary | ICD-10-CM

## 2023-11-13 DIAGNOSIS — J449 Chronic obstructive pulmonary disease, unspecified: Secondary | ICD-10-CM | POA: Diagnosis not present

## 2023-11-13 DIAGNOSIS — R2689 Other abnormalities of gait and mobility: Secondary | ICD-10-CM | POA: Diagnosis not present

## 2023-11-13 DIAGNOSIS — Z9181 History of falling: Secondary | ICD-10-CM | POA: Diagnosis not present

## 2023-11-13 DIAGNOSIS — I4891 Unspecified atrial fibrillation: Secondary | ICD-10-CM

## 2023-11-13 DIAGNOSIS — I2699 Other pulmonary embolism without acute cor pulmonale: Secondary | ICD-10-CM | POA: Insufficient documentation

## 2023-11-13 DIAGNOSIS — G20A1 Parkinson's disease without dyskinesia, without mention of fluctuations: Secondary | ICD-10-CM | POA: Diagnosis not present

## 2023-11-13 DIAGNOSIS — R2681 Unsteadiness on feet: Secondary | ICD-10-CM | POA: Diagnosis not present

## 2023-11-13 DIAGNOSIS — J189 Pneumonia, unspecified organism: Secondary | ICD-10-CM | POA: Diagnosis not present

## 2023-11-13 DIAGNOSIS — C561 Malignant neoplasm of right ovary: Secondary | ICD-10-CM | POA: Insufficient documentation

## 2023-11-13 NOTE — Progress Notes (Deleted)
Location:  Other [place].  Nursing Home Room Number: [facility] [address] Place of Service:  SNF [phone]) Provider:  Earnestine Mealing, MD  Patient Care Team: Earnestine Mealing, MD as PCP - General (Family Medicine) Iran Ouch, MD as PCP - Cardiology (Cardiology) Sidney Ace, MD as Referring Physician (Allergy) Jerene Bears, MD as Consulting Physician (Gynecology) Tat, Octaviano Batty, DO as Consulting Physician (Neurology) Serena Croissant, MD as Consulting Physician (Hematology and Oncology) Tat, Octaviano Batty, DO as Consulting Physician (Neurology) Kathyrn Sheriff, [facility] (Inactive) as Pharmacist (Pharmacist)  Extended Emergency Contact Information Primary Emergency Contact: Elson Areas [place], [place] [address] [address] Home Phone: [phone] Mobile Phone: [phone] Relation: Brother Secondary Emergency Contact: [facility] Phone: [phone] Mobile Phone: [phone] Relation: Niece Preferred language: English Interpreter needed? No  Code Status:  DNR Goals of care: Advanced Directive information    11/13/2023   10:05 AM  Advanced Directives  Does Patient Have a Medical Advance Directive? Yes  Type of Estate agent of [place];Out of facility DNR (pink MOST or yellow form);Living will  Does patient want to make changes to medical advance directive? No - Patient declined  Copy of Healthcare Power of Attorney in Chart? Yes - validated most recent copy scanned in chart (See row information)     Chief Complaint  Patient presents with   Medical Management of Chronic Issues    Medical Management of Chronic Issues.     HPI:  Pt is a 82 y.o. female seen today for medical management of chronic diseases.     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 10/2021 Cath: Nl cors.   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 10/2021 RHC: PCWP V wave of 34; b. 01/2022 TEE: Severe flail/prolapse of large area of A2 scallop with severe posteriorly directed MR; c. 01/2022 TEER w /Mitraclip XTW x 2; d. 07/2022 Echo: EF 60-65%. No rwma, mild LVH, GrIII DD. Sev dil LA. Mod elev PASP. Mod-Sev MR. Mod MS.   Moderate mitral regurgitation by prior echocardiogram    Osteoporosis, post-menopausal 03/14/2012   Ovarian tumor of borderline malignancy, right 2004   Palpitations    a. 02/2022 Zio: Predominantly sinus rhythm at 67 (52-171).  2 runs of NSVT -fastest 9 beats at 167.  35 SVT runs-longest 19 beats, fastest 171x6 beats.   Pancreatitis    secondary to Cholelithiasis   Parkinson disease (HCC) 02/10/2015   Post-thoracotomy pain syndrome    Pseudomonas pneumonia (HCC) 03/2008   Status post implantation of mitral valve leaflet clip 02/15/2022   s/p XTW MitraClip x2 by Dr. Excell Seltzer in the A2/P2:  b. 04/03/23 s/p third mitraclip implantation.   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   APPENDECTOMY  2004   BREAST BIOPSY  08/2004   right breast-benign   BREAST LUMPECTOMY  1992   benign   BUBBLE STUDY  12/27/2021    Procedure: BUBBLE STUDY;  Surgeon: Sande Rives, MD;  Location[facility] END[facility]COPY;  Service: Cardiovascular;;   CARDIAC CATHETERIZATION  2007   essentially negation for significant CAD   CARDIOVERSION N/A 09/05/2023   Procedure: CARDIOVERSION;  Surgeon: Debbe Odea, MD;  Location: [facility] ORS;  Service: Cardiovascular;  Laterality: N/A;   CATARACT EXTRACTION W/PHACO Left 07/24/2023   Procedure: CATARACT EXTRACTION PHACO AND INTRAOCULAR LENS PLACEMENT (IOC) LEFT 5.34 0037.3;  Surgeon: Lockie Mola, MD;  Location: [facility] SURGERY CNTR;  Service: Ophthalmology;  Laterality: Left;   CATARACT EXTRACTION W/PHACO Right 08/07/2023   Procedure: CATARACT EXTRACTION PHACO AND INTRAOCULAR LENS PLACEMENT (IOC) RIGHT 7.83 00:47.9;  Surgeon: Lockie Mola, MD;  Location: [facility] SURGERY [facility]TR;  Service: Ophthalmology;  Laterality: Right;   CHOLECYSTECTOMY  2001   COLONOSCOPY  2014   Dr Marina Goodell , due 2019   COLONOSCOPY  09/2018   2 TA removed, int hem, no f/u needed (Dr Marina Goodell)    ESOPHAGOGASTRODUODENOSCOPY  09/2018   GERD with esophagitis and stricture dilated, antral gastritis negative for H pylori Marina Goodell)   HIP ARTHROPLASTY Left 08/21/2022   Procedure: ARTHROPLASTY BIPOLAR HIP (HEMIARTHROPLASTY);  Surgeon: Juanell Fairly, MD;  Location: [facility] ORS;  Service: Orthopedics;  Laterality: Left;   LUNG BIOPSY  2002   [name], Dr Edwyna Shell   MASTECTOMY MODIFIED RADICAL Left 2002   oral chemotheraphy (tamoxifen then Armidex) no radiation, Dr.[name]   RIGHT/LEFT HEART CATH AND CORONARY ANGIOGRAPHY Bilateral 11/14/2021   Procedure: RIGHT/LEFT HEART CATH AND CORONARY ANGIOGRAPHY;  Surgeon: Iran Ouch, MD;  Location: [facility] INVASIVE CV LAB;  Service: Cardiovascular;  Laterality: Bilateral;   TEE WITHOUT CARDIOVERSION N/A 12/29/2018   Procedure: TRANSESOPHAGEAL ECHOCARDIOGRAM (TEE);  Surgeon: Iran Ouch, MD;  Location: [facility] ORS;  Service: Cardiovascular;  Laterality: N/A;   TEE WITHOUT  CARDIOVERSION N/A 12/27/2021   Procedure: TRANSESOPHAGEAL ECHOCARDIOGRAM (TEE);  Surgeon: Sande Rives, MD;  Location: [facility] ENDOSCOPY;  Service: Cardiovascular;  Laterality: N/A;   TEE WITHOUT CARDIOVERSION N/A 02/15/2022   Procedure: TRANSESOPHAGEAL ECHOCARDIOGRAM (TEE);  Surgeon: Tonny Bollman, MD;  Location: [facility] INVASIVE CV LAB;  Service: Cardiovascular;  Laterality: N/A;   TEE WITHOUT CARDIOVERSION N/A 05/14/2022   Procedure: TRANSESOPHAGEAL ECHOCARDIOGRAM (TEE);  Surgeon: Sande Rives, MD;  Location: [facility] ENDOSCOPY;  Service: Cardiovascular;  Laterality: N/A;   TEE WITHOUT CARDIOVERSION N/A 04/03/2023   Procedure: TRANSESOPHAGEAL ECHOCARDIOGRAM;  Surgeon: Tonny Bollman, MD;  Location: [facility] INVASIVE CV LAB;  Service: Cardiovascular;  Laterality: N/A;   TEE WITHOUT CARDIOVERSION N/A 09/05/2023   Procedure: TRANSESOPHAGEAL ECHOCARDIOGRAM;  Surgeon: Debbe Odea, MD;  Location: [facility] ORS;  Service: Cardiovascular;  Laterality: N/A;   TONSILLECTOMY  1946   TRANSCATHETER MITRAL EDGE TO EDGE REPAIR N/A 02/15/2022   Procedure: MITRAL VALVE REPAIR;  Surgeon: Tonny Bollman, MD;  Location: [facility] INVASIVE CV LAB;  Service: Cardiovascular;  Laterality: N/A;   TRANSCATHETER MITRAL EDGE TO EDGE REPAIR N/A 04/03/2023   Procedure: MITRAL VALVE REPAIR;  Surgeon:  Tonny Bollman, MD;  Location: [facility] INVASIVE CV LAB;  Service: Cardiovascular;  Laterality: N/A;    Allergies  Allergen Reactions   Sulfa Antibiotics Anaphylaxis   Sulfonamide Derivatives Anaphylaxis   Topamax [Topiramate] Other (See Comments)    Metabolic acidosis    Clarithromycin Other (See Comments)    pericarditis Other reaction(s): Not available   Hydrocodone Other (See Comments)    "hyper and climbing the walls"   Motrin [Ibuprofen] Other (See Comments)    headaches   Misc. Sulfonamide Containing Compounds     Other reaction(s): Not available   Symbicort [Budesonide-Formoterol Fumarate] Other (See Comments)    02/07/15  tremor   Tetracycline Hcl Other (See Comments)   Percocet [Oxycodone-Acetaminophen] Itching and Rash   Tape Itching and Rash    Use paper tape only   Tetracyclines & Related Other (See Comments)    "immediate yeast infection"    Outpatient Encounter Medications as of 11/13/2023  Medication Sig   acetaminophen (TYLENOL) 500 MG tablet Take 1,000 mg by mouth 3 (three) times daily.   albuterol (VENTOLIN HFA) 108 (90 Base) MCG/ACT inhaler Inhale 2 puffs into the lungs every 6 (six) hours as needed for wheezing or shortness of breath.   amiodarone (PACERONE) 200 MG tablet Take 200 mg by mouth daily.   apixaban (ELIQUIS) 2.5 MG TABS tablet Take 1 tablet (2.5 mg total) by mouth 2 (two) times daily.   bisacodyl (DULCOLAX) 10 MG suppository Place 10 mg rectally as needed for moderate constipation.   calcium carbonate (TUMS - DOSED IN MG ELEMENTAL CALCIUM) 500 MG chewable tablet Chew 2 tablets by mouth daily as needed for indigestion or heartburn.   Calcium Carbonate Antacid 600 MG chewable tablet Chew 600 mg by mouth daily.   Carbidopa-Levodopa ER (SINEMET CR) 25-100 MG tablet controlled release TAKE TWO TABLET BY MOUTH AT NINE IN THE MORNING, ONE TAB AT ONE IN THE EVENING AND ONE TAB AT FIVE IN THE EVENING   cetirizine (ZYRTEC) 10 MG tablet Take 10 mg by mouth daily.   Cholecalciferol (VITAMIN D3) 25 MCG (1000 UT) CAPS Take 1 capsule (1,000 Units total) by mouth daily.   clonazePAM (KLONOPIN) 0.5 MG tablet TAKE ONE HALF (1/2) A TABLET BY MOUTH AT BEDTIME   docusate sodium (COLACE) 100 MG capsule Take 100 mg by mouth 2 (two) times daily.   EPINEPHrine 0.3 mg/0.3 mL IJ SOAJ injection Inject 0.3 mg into the muscle as needed for anaphylaxis.   fluticasone (FLONASE) 50 MCG/ACT nasal spray Place into both nostrils.   Fluticasone-Umeclidin-Vilant (TRELEGY ELLIPTA) 100-62.5-25 MCG/ACT AEPB Inhale 1 puff into the lungs daily.   furosemide (LASIX) 40 MG tablet TAKE ONE TABLET BY MOUTH ONCE DAILY *NEW DOSE*    gabapentin ([name]) 300 MG capsule TAKE TWO CAPSULES BY MOUTH THREE TIMES DAILY   magnesium hydroxide (MILK OF MAGNESIA) 400 MG/5ML suspension Take 30 mLs by mouth daily as needed for mild constipation.   pantoprazole (PROTONIX) 20 MG tablet Take 1 tablet (20 mg total) by mouth daily.   polyethylene glycol (MIRALAX / GLYCOLAX) 17 g packet Take 17 g by mouth daily as needed.   [DISCONTINUED] aluminum-magnesium hydroxide 200-200 MG/5ML suspension Take 5 mLs by mouth every 4 (four) hours as needed for indigestion.   [DISCONTINUED] Fexofenadine HCl (ALLEGRA PO) Take 180 mg by mouth daily at 8 pm.   No facility-administered encounter medications on file as of 11/13/2023.    Review of Systems  Immunization History  Administered Date(s) Administered   Fluad Quad(high  Dose 65+) 08/11/2019, 08/03/2020, 08/08/2021, 09/06/2022   Influenza Split 09/11/2011   Influenza Whole 09/09/2007, 08/08/2008, 08/15/2010, 08/26/2012   Influenza,inj,Quad PF,6+ Mos 08/18/2013, 08/20/2014, 08/25/2018   Influenza-Unspecified 09/27/2015, 08/26/2017, 09/18/2023   Moderna Covid-19 Fall Seasonal Vaccine 62yrs & older 10/25/2022   Moderna Covid-19 Vaccine Bivalent Booster 62yrs & up 04/24/2022, 03/05/2023   Moderna Sars-Covid-2 Vaccination 01/01/2020, 01/20/2020, 02/16/2020, 07/25/2020, 11/22/2020   PPD Test 08/24/2022   Pneumococcal Conjugate-13 02/12/2014   Pneumococcal Polysaccharide-23 11/26/2006, 10/21/2012, 11/22/2020, 11/21/2021   Rsv, Bivalent, Protein Subunit Rsvpref,pf Verdis Frederickson) 10/25/2022   Td 01/01/2011, 06/30/2021   Zoster Recombinant(Shingrix) 02/22/2018, 07/08/2019   Zoster, Live 05/26/2007   Pertinent  Health Maintenance Due  Topic Date Due   INFLUENZA VACCINE  Completed   DEXA SCAN  Completed   Colonoscopy  Discontinued      11/25/2022    8:30 PM 11/26/2022    9:30 AM 12/07/2022   11:43 AM 01/29/2023   10:32 AM 08/01/2023   10:29 AM  Fall Risk  Falls in the past year?   1 1 0  Was there an  injury with Fall?   1 1 0  Fall Risk Category Calculator   3 3 0  Fall Risk Category (Retired)   High    (RETIRED) Patient Fall Risk Level High fall risk High fall risk     Fall risk Follow up     Falls evaluation completed   Functional Status Survey:    Vitals:   11/13/23 0957 11/13/23 1006  BP: (!) 179/71 120/69  Pulse: (!) 52   Resp: 17   Temp: (!) 97.5 F (36.4 C)   SpO2: 97%   Weight: 116 lb 12.8 oz (53 kg)   Height: 5\' 2"  (1.575 m)    Body mass index is 21.36 kg/m. Physical Exam  Labs reviewed: Recent Labs    11/23/22 0431 11/24/22 0432 11/25/22 0458 12/07/22 1223 04/01/23 0816 09/04/23 1057 09/05/23 0521 09/06/23 0345 09/07/23 0313 09/08/23 0417 09/16/23 0000  NA 140   < > 139 137   < >  --    < > 135 135 139 140  K 3.4*   < > 3.9 4.1   < >  --    < > 4.1 3.6 4.1 3.7  CL 101   < > 101 101   < >  --    < > 100 100 102 101  CO2 31   < > 30 28   < >  --    < > 23 26 26  31*  GLUCOSE 85   < > 94 87   < >  --    < > 149* 111* 84  --   BUN 17   < > 11 17   < >  --    < > 27* 27* 23 17  CREATININE 0.55   < > 0.57 0.79   < >  --    < > 1.07* 0.82 0.78 0.9  CALCIUM 7.8*   < > 8.3* 9.4   < >  --    < > 8.4* 7.8* 8.3* 9.1  MG 2.4   < > 2.4 2.2  --  2.1  --   --   --   --   --   PHOS 3.8  --  3.0 3.6  --   --   --   --   --   --   --    < > = values in this interval  not displayed.   Recent Labs    04/01/23 0816 08/05/23 1114 09/05/23 0521  AST 20 13 21   ALT 5 2 14   ALKPHOS 67 98 80  BILITOT 2.0* 1.0 1.5*  PROT 6.9 7.6 6.7  ALBUMIN 3.8 3.7 3.4*   Recent Labs    09/06/23 0345 09/07/23 0313 09/08/23 0417 09/26/23 0000  WBC 5.2 6.2 6.0 3.8  NEUTROABS 4.5 4.8 4.3  --   HGB 11.9* 11.8* 12.7 12.4  HCT 36.4 36.8 38.9 39  MCV 101.1* 100.0 99.0  --   PLT 145* 140* 162 173   Lab Results  Component Value Date   TSH 1.145 09/07/2023   Lab Results  Component Value Date   HGBA1C 5.3 01/14/2018   Lab Results  Component Value Date   CHOL 171 08/08/2021    HDL 53.30 08/08/2021   LDLCALC 97 08/08/2021   LDLDIRECT 135.0 08/16/2009   TRIG 100.0 08/08/2021   CHOLHDL 3 08/08/2021    Significant Diagnostic Results in last 30 days:  No results found.  Assessment/Plan There are no diagnoses linked to this encounter.   Family/ staff Communication: ***  Labs/tests ordered:  ***

## 2023-11-13 NOTE — Progress Notes (Signed)
Location:  Other Nursing Home Room Number: [facility] [address] Place of Service:  SNF [phone]) Provider:  Ander Gaster, Benetta Spar, MD  Patient Care Team: Earnestine Mealing, MD as PCP - General (Family Medicine) Iran Ouch, MD as PCP - Cardiology (Cardiology) Sidney Ace, MD as Referring Physician (Allergy) Jerene Bears, MD as Consulting Physician (Gynecology) Tat, Octaviano Batty, DO as Consulting Physician (Neurology) Serena Croissant, MD as Consulting Physician (Hematology and Oncology) Tat, Octaviano Batty, DO as Consulting Physician (Neurology) Kathyrn Sheriff, [facility] (Inactive) as Pharmacist (Pharmacist)  Extended Emergency Contact Information Primary Emergency Contact: [name], [place] [address] [place] of [place] Home Phone: [phone] Mobile Phone: [phone] Relation: Brother Secondary Emergency Contact: [name] Phone: [phone] Mobile Phone: [phone] Relation: Niece Preferred language: English Interpreter needed? No  Code Status:  DNR Goals of care: Advanced Directive information    11/13/2023   10:05 AM  Advanced Directives  Does Patient Have a Medical Advance Directive? Yes  Type of Estate agent of [place];Out of facility DNR (pink MOST or yellow form);Living will  Does patient want to make changes to medical advance directive? No - Patient declined  Copy of Healthcare Power of Attorney in Chart? Yes - validated most recent copy scanned in chart (See row information)     Chief Complaint  Patient presents with   Medical Management of Chronic Issues    Medical Management of Chronic Issues.     HPI:  Pt is a 82 y.o. female seen today for medical management of chronic diseases.    She misses some of the independence she used to have. Everyone has been really nice. She misses going out and driving to the store/ She had a lot of change since her hip fracture 08/19/2022. She continues to have pain  and mobility limitations. She broke her patella in 2019 which impacted her independence. She  fractured her clavicle in 2022. Pain has not been limiting her at this time.   She is breathing better than she used to.   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 10/2021 Cath: Nl cors.   Arthritis    Asthma    Breast cancer (HCC) 2002   infiltrative ductal carcinoma    Cataract 2019   Chronic heart failure with preserved ejection fraction (HFpEF) (HCC)    COPD (chronic obstructive pulmonary disease) (HCC) 07/2007   Coronary artery disease    Ductal carcinoma of breast, estrogen receptor positive, stage 1 (HCC) 09/16/2012   Endometriosis    GERD (gastroesophageal reflux disease)    Hx of mitral valve repair    Hypercalcemia 09/14/2013   Hypertension    Lesion of breast 1992   right, benign   Lung disease    secondary to [name] infection   Mastalgia 05/1994   Mitral regurgitation    a. 10/2021 RHC: PCWP V wave of 34; b. 01/2022 TEE: Severe flail/prolapse of large area of A2 scallop with severe posteriorly directed MR; c. 01/2022 TEER w /Mitraclip XTW x 2; d. 07/2022 Echo: EF 60-65%. No rwma, mild LVH, GrIII DD. Sev dil LA. Mod elev PASP. Mod-Sev MR. Mod MS.   Moderate mitral regurgitation by prior echocardiogram    Osteoporosis, post-menopausal 03/14/2012   Ovarian tumor of borderline malignancy, right 2004   Palpitations  a. 02/2022 Zio: Predominantly sinus rhythm at 67 (52-171).  2 runs of NSVT -fastest 9 beats at 167.  35 SVT runs-longest 19 beats, fastest 171x6 beats.   Pancreatitis    secondary to Cholelithiasis   Parkinson disease (HCC) 02/10/2015   Post-thoracotomy pain syndrome    Pseudomonas pneumonia (HCC) 03/2008   Status post implantation of mitral valve leaflet clip 02/15/2022   s/p XTW MitraClip x2 by Dr. Excell Seltzer in the A2/P2: b. 04/03/23 s/p third mitraclip implantation.   Traumatic  closed fracture of distal clavicle with minimal displacement, left, initial encounter 06/2021   [facility]   Uterine fibroid    Vitamin D deficiency    Past Surgical History:  Procedure Laterality Date   ABDOMINAL HYSTERECTOMY     & BSO for Mucinous borderline tumor of R ovary 2004   APPENDECTOMY  2004   BREAST BIOPSY  08/2004   right breast-benign   BREAST LUMPECTOMY  1992   benign   BUBBLE STUDY  12/27/2021   Procedure: BUBBLE STUDY;  Surgeon: Sande Rives, MD;  Location: [facility] ENDOSCOPY;  Service: Cardiovascular;;   CARDIAC CATHETERIZATION  2007   essentially negation for significant CAD   CARDIOVERSION N/A 09/05/2023   Procedure: CARDIOVERSION;  Surgeon: Debbe Odea, MD;  Location: [facility] ORS;  Service: Cardiovascular;  Laterality: N/A;   CATARACT EXTRACTION W/PHACO Left 07/24/2023   Procedure: CATARACT EXTRACTION PHACO AND INTRAOCULAR LENS PLACEMENT (IOC) LEFT 5.34 0037.3;  Surgeon: Lockie Mola, MD;  Location: [facility];  Service: Ophthalmology;  Laterality: Left;   CATARACT EXTRACTION W/PHACO Right 08/07/2023   Procedure: CATARACT EXTRACTION PHACO AND INTRAOCULAR LENS PLACEMENT (IOC) RIGHT 7.83 00:47.9;  Surgeon: Lockie Mola, MD;  Location: [facility];  Service: Ophthalmology;  Laterality: Right;   CHOLECYSTECTOMY  2001   COLONOSCOPY  2014   Dr Marina Goodell , due 2019   COLONOSCOPY  09/2018   2 TA removed, int hem, no f/u needed (Dr Marina Goodell)    ESOPHAGOGASTRODUODENOSCOPY  09/2018   GERD with esophagitis and stricture dilated, antral gastritis negative for H pylori Marina Goodell)   HIP ARTHROPLASTY Left 08/21/2022   Procedure: ARTHROPLASTY BIPOLAR HIP (HEMIARTHROPLASTY);  Surgeon: Juanell Fairly, MD;  Location: [facility] ORS;  Service: Orthopedics;  Laterality: Left;   LUNG BIOPSY  2002   [name], Dr Edwyna Shell   MASTECTOMY MODIFIED RADICAL Left 2002   oral chemotheraphy (tamoxifen then Armidex) no radiation, Dr.[name]   RIGHT/LEFT HEART CATH AND  CORONARY ANGIOGRAPHY Bilateral 11/14/2021   Procedure: RIGHT/LEFT HEART CATH AND CORONARY ANGIOGRAPHY;  Surgeon: Iran Ouch, MD;  Location: [facility] INVASIVE CV LAB;  Service: Cardiovascular;  Laterality: Bilateral;   TEE WITHOUT CARDIOVERSION N/A 12/29/2018   Procedure: TRANSESOPHAGEAL ECHOCARDIOGRAM (TEE);  Surgeon: Iran Ouch, MD;  Location: [facility] ORS;  Service: Cardiovascular;  Laterality: N/A;   TEE WITHOUT CARDIOVERSION N/A 12/27/2021   Procedure: TRANSESOPHAGEAL ECHOCARDIOGRAM (TEE);  Surgeon: Sande Rives, MD;  Location: [facility] ENDOSCOPY;  Service: Cardiovascular;  Laterality: N/A;   TEE WITHOUT CARDIOVERSION N/A 02/15/2022   Procedure: TRANSESOPHAGEAL ECHOCARDIOGRAM (TEE);  Surgeon: Tonny Bollman, MD;  Location: [facility] INVASIVE CV LAB;  Service: Cardiovascular;  Laterality: N/A;   TEE WITHOUT CARDIOVERSION N/A 05/14/2022   Procedure: TRANSESOPHAGEAL ECHOCARDIOGRAM (TEE);  Surgeon: Sande Rives, MD;  Location: [facility];  Service: Cardiovascular;  Laterality: N/A;   TEE WITHOUT CARDIOVERSION N/A 04/03/2023   Procedure: TRANSESOPHAGEAL ECHOCARDIOGRAM;  Surgeon: Tonny Bollman, MD;  Location: [facility] INVASIVE CV LAB;  Service: Cardiovascular;  Laterality: N/A;   TEE WITHOUT CARDIOVERSION  N/A 09/05/2023   Procedure: TRANSESOPHAGEAL ECHOCARDIOGRAM;  Surgeon: Debbe Odea, MD;  Location: [facility] ORS;  Service: Cardiovascular;  Laterality: N/A;   TONSILLECTOMY  1946   TRANSCATHETER MITRAL EDGE TO EDGE REPAIR N/A 02/15/2022   Procedure: MITRAL VALVE REPAIR;  Surgeon: Tonny Bollman, MD;  Location: [facility] INVASIVE CV LAB;  Service: Cardiovascular;  Laterality: N/A;   TRANSCATHETER MITRAL EDGE TO EDGE REPAIR N/A 04/03/2023   Procedure: MITRAL VALVE REPAIR;  Surgeon: Tonny Bollman, MD;  Location: [facility] INVASIVE CV LAB;  Service: Cardiovascular;  Laterality: N/A;    Allergies  Allergen Reactions   Sulfa Antibiotics Anaphylaxis   Sulfonamide Derivatives Anaphylaxis   Topamax [Topiramate]  Other (See Comments)    Metabolic acidosis    Clarithromycin Other (See Comments)    pericarditis Other reaction(s): Not available   Hydrocodone Other (See Comments)    "hyper and climbing the walls"   Motrin [Ibuprofen] Other (See Comments)    headaches   Misc. Sulfonamide Containing Compounds     Other reaction(s): Not available   Symbicort [Budesonide-Formoterol Fumarate] Other (See Comments)    02/07/15 tremor   Tetracycline Hcl Other (See Comments)   Percocet [Oxycodone-Acetaminophen] Itching and Rash   Tape Itching and Rash    Use paper tape only   Tetracyclines & Related Other (See Comments)    "immediate yeast infection"    Outpatient Encounter Medications as of 11/13/2023  Medication Sig   acetaminophen (TYLENOL) 500 MG tablet Take 1,000 mg by mouth 3 (three) times daily.   albuterol (VENTOLIN HFA) 108 (90 Base) MCG/ACT inhaler Inhale 2 puffs into the lungs every 6 (six) hours as needed for wheezing or shortness of breath.   amiodarone (PACERONE) 200 MG tablet Take 200 mg by mouth daily.   apixaban (ELIQUIS) 2.5 MG TABS tablet Take 1 tablet (2.5 mg total) by mouth 2 (two) times daily.   bisacodyl (DULCOLAX) 10 MG suppository Place 10 mg rectally as needed for moderate constipation.   calcium carbonate (TUMS - DOSED IN MG ELEMENTAL CALCIUM) 500 MG chewable tablet Chew 2 tablets by mouth daily as needed for indigestion or heartburn.   Calcium Carbonate Antacid 600 MG chewable tablet Chew 600 mg by mouth daily.   Carbidopa-Levodopa ER (SINEMET CR) 25-100 MG tablet controlled release TAKE TWO TABLET BY MOUTH AT NINE IN THE MORNING, ONE TAB AT ONE IN THE EVENING AND ONE TAB AT FIVE IN THE EVENING   cetirizine (ZYRTEC) 10 MG tablet Take 10 mg by mouth daily.   Cholecalciferol (VITAMIN D3) 25 MCG (1000 UT) CAPS Take 1 capsule (1,000 Units total) by mouth daily.   clonazePAM (KLONOPIN) 0.5 MG tablet TAKE ONE HALF (1/2) A TABLET BY MOUTH AT BEDTIME   docusate sodium (COLACE) 100 MG  capsule Take 100 mg by mouth 2 (two) times daily.   EPINEPHrine 0.3 mg/0.3 mL IJ SOAJ injection Inject 0.3 mg into the muscle as needed for anaphylaxis.   fluticasone (FLONASE) 50 MCG/ACT nasal spray Place into both nostrils.   Fluticasone-Umeclidin-Vilant (TRELEGY ELLIPTA) 100-62.5-25 MCG/ACT AEPB Inhale 1 puff into the lungs daily.   furosemide (LASIX) 40 MG tablet TAKE ONE TABLET BY MOUTH ONCE DAILY *NEW DOSE*   gabapentin ([name]) 300 MG capsule TAKE TWO CAPSULES BY MOUTH THREE TIMES DAILY   magnesium hydroxide (MILK OF MAGNESIA) 400 MG/5ML suspension Take 30 mLs by mouth daily as needed for mild constipation.   pantoprazole (PROTONIX) 20 MG tablet Take 1 tablet (20 mg total) by mouth daily.   polyethylene glycol (MIRALAX /  GLYCOLAX) 17 g packet Take 17 g by mouth daily as needed.   [DISCONTINUED] aluminum-magnesium hydroxide 200-200 MG/5ML suspension Take 5 mLs by mouth every 4 (four) hours as needed for indigestion.   [DISCONTINUED] Fexofenadine HCl (ALLEGRA PO) Take 180 mg by mouth daily at 8 pm.   No facility-administered encounter medications on file as of 11/13/2023.    Review of Systems  Immunization History  Administered Date(s) Administered   Fluad Quad(high Dose 65+) 08/11/2019, 08/03/2020, 08/08/2021, 09/06/2022   Influenza Split 09/11/2011   Influenza Whole 09/09/2007, 08/08/2008, 08/15/2010, 08/26/2012   Influenza,inj,Quad PF,6+ Mos 08/18/2013, 08/20/2014, 08/25/2018   Influenza-Unspecified 09/27/2015, 08/26/2017, 09/18/2023   Moderna Covid-19 Fall Seasonal Vaccine 69yrs & older 10/25/2022   Moderna Covid-19 Vaccine Bivalent Booster 10yrs & up 04/24/2022, 03/05/2023   Moderna Sars-Covid-2 Vaccination 01/01/2020, 01/20/2020, 02/16/2020, 07/25/2020, 11/22/2020   PPD Test 08/24/2022   Pneumococcal Conjugate-13 02/12/2014   Pneumococcal Polysaccharide-23 11/26/2006, 10/21/2012, 11/22/2020, 11/21/2021   Rsv, Bivalent, Protein Subunit Rsvpref,pf Verdis Frederickson) 10/25/2022   Td  01/01/2011, 06/30/2021   Zoster Recombinant(Shingrix) 02/22/2018, 07/08/2019   Zoster, Live 05/26/2007   Pertinent  Health Maintenance Due  Topic Date Due   INFLUENZA VACCINE  Completed   DEXA SCAN  Completed   Colonoscopy  Discontinued      11/25/2022    8:30 PM 11/26/2022    9:30 AM 12/07/2022   11:43 AM 01/29/2023   10:32 AM 08/01/2023   10:29 AM  Fall Risk  Falls in the past year?   1 1 0  Was there an injury with Fall?   1 1 0  Fall Risk Category Calculator   3 3 0  Fall Risk Category (Retired)   High    (RETIRED) Patient Fall Risk Level High fall risk High fall risk     Fall risk Follow up     Falls evaluation completed   Functional Status Survey:    Vitals:   11/13/23 0957 11/13/23 1006  BP: (!) 179/71 120/69  Pulse: (!) 52   Resp: 17   Temp: (!) 97.5 F (36.4 C)   SpO2: 97%   Weight: 116 lb 12.8 oz (53 kg)   Height: 5\' 2"  (1.575 m)    Body mass index is 21.36 kg/m. Physical Exam Constitutional:      Appearance: Normal appearance.  HENT:     Head:     Comments: Masked faces Cardiovascular:     Rate and Rhythm: Normal rate and regular rhythm.     Pulses: Normal pulses.     Heart sounds: Normal heart sounds.  Pulmonary:     Effort: Pulmonary effort is normal.  Abdominal:     General: Abdomen is flat. Bowel sounds are normal.     Palpations: Abdomen is soft.  Musculoskeletal:        General: No swelling or tenderness.  Skin:    General: Skin is warm and dry.  Neurological:     Mental Status: She is alert and oriented to person, place, and time.     Gait: Gait abnormal.  Psychiatric:        Mood and Affect: Mood normal.     Labs reviewed: Recent Labs    11/23/22 0431 11/24/22 0432 11/25/22 0458 12/07/22 1223 04/01/23 0816 09/04/23 1057 09/05/23 0521 09/06/23 0345 09/07/23 0313 09/08/23 0417 09/16/23 0000  NA 140   < > 139 137   < >  --    < > 135 135 139 140  K 3.4*   < >  3.9 4.1   < >  --    < > 4.1 3.6 4.1 3.7  CL 101   < > 101 101   <  >  --    < > 100 100 102 101  CO2 31   < > 30 28   < >  --    < > 23 26 26  31*  GLUCOSE 85   < > 94 87   < >  --    < > 149* 111* 84  --   BUN 17   < > 11 17   < >  --    < > 27* 27* 23 17  CREATININE 0.55   < > 0.57 0.79   < >  --    < > 1.07* 0.82 0.78 0.9  CALCIUM 7.8*   < > 8.3* 9.4   < >  --    < > 8.4* 7.8* 8.3* 9.1  MG 2.4   < > 2.4 2.2  --  2.1  --   --   --   --   --   PHOS 3.8  --  3.0 3.6  --   --   --   --   --   --   --    < > = values in this interval not displayed.   Recent Labs    04/01/23 0816 08/05/23 1114 09/05/23 0521  AST 20 13 21   ALT 5 2 14   ALKPHOS 67 98 80  BILITOT 2.0* 1.0 1.5*  PROT 6.9 7.6 6.7  ALBUMIN 3.8 3.7 3.4*   Recent Labs    09/06/23 0345 09/07/23 0313 09/08/23 0417 09/26/23 0000  WBC 5.2 6.2 6.0 3.8  NEUTROABS 4.5 4.8 4.3  --   HGB 11.9* 11.8* 12.7 12.4  HCT 36.4 36.8 38.9 39  MCV 101.1* 100.0 99.0  --   PLT 145* 140* 162 173   Lab Results  Component Value Date   TSH 1.145 09/07/2023   Lab Results  Component Value Date   HGBA1C 5.3 01/14/2018   Lab Results  Component Value Date   CHOL 171 08/08/2021   HDL 53.30 08/08/2021   LDLCALC 97 08/08/2021   LDLDIRECT 135.0 08/16/2009   TRIG 100.0 08/08/2021   CHOLHDL 3 08/08/2021    Significant Diagnostic Results in last 30 days:  No results found.  Assessment/Plan Parkinson's disease without dyskinesia or fluctuating manifestations (HCC)  Chronic obstructive airway disease with asthma (HCC)  Atrial fibrillation, unspecified type (HCC)  Essential hypertension  DNR (do not resuscitate) Parkinson's symptoms well-controlled on current dose of sinemet - mobility intact. She endorses some challenges managing the change in her independence. Discussed possible medication adjustments that could impact mood, declined. Breathing stable at this time. Rate well-controlled no signs of bleeding. Continue amiodarone. Continue eliquis at this time. Appears euvolemic on exam, continue lasix.  Continue discussions for discontinuation of clonazepam. Pain well-controlled on lower dose of gabapentin, continue renally dosed medication.   Family/ staff Communication: nursing  Labs/tests ordered:  none

## 2023-11-14 DIAGNOSIS — E875 Hyperkalemia: Secondary | ICD-10-CM | POA: Diagnosis not present

## 2023-11-14 DIAGNOSIS — I10 Essential (primary) hypertension: Secondary | ICD-10-CM | POA: Diagnosis not present

## 2023-11-14 DIAGNOSIS — I110 Hypertensive heart disease with heart failure: Secondary | ICD-10-CM | POA: Diagnosis not present

## 2023-11-14 LAB — COMPREHENSIVE METABOLIC PANEL
Calcium: 9.9 (ref 8.7–10.7)
eGFR: 71

## 2023-11-14 LAB — BASIC METABOLIC PANEL
BUN: 18 (ref 4–21)
CO2: 36 — AB (ref 13–22)
Chloride: 101 (ref 99–108)
Creatinine: 0.8 (ref 0.5–1.1)
Glucose: 75
Potassium: 4.4 meq/L (ref 3.5–5.1)
Sodium: 143 (ref 137–147)

## 2023-11-15 DIAGNOSIS — J449 Chronic obstructive pulmonary disease, unspecified: Secondary | ICD-10-CM | POA: Diagnosis not present

## 2023-11-15 DIAGNOSIS — I5033 Acute on chronic diastolic (congestive) heart failure: Secondary | ICD-10-CM | POA: Diagnosis not present

## 2023-11-15 DIAGNOSIS — J189 Pneumonia, unspecified organism: Secondary | ICD-10-CM | POA: Diagnosis not present

## 2023-11-15 DIAGNOSIS — M6281 Muscle weakness (generalized): Secondary | ICD-10-CM | POA: Diagnosis not present

## 2023-11-15 DIAGNOSIS — G20A1 Parkinson's disease without dyskinesia, without mention of fluctuations: Secondary | ICD-10-CM | POA: Diagnosis not present

## 2023-11-15 DIAGNOSIS — R2689 Other abnormalities of gait and mobility: Secondary | ICD-10-CM | POA: Diagnosis not present

## 2023-11-15 DIAGNOSIS — R2681 Unsteadiness on feet: Secondary | ICD-10-CM | POA: Diagnosis not present

## 2023-11-15 DIAGNOSIS — I4891 Unspecified atrial fibrillation: Secondary | ICD-10-CM | POA: Diagnosis not present

## 2023-11-15 DIAGNOSIS — Z9181 History of falling: Secondary | ICD-10-CM | POA: Diagnosis not present

## 2023-11-17 ENCOUNTER — Encounter: Payer: Self-pay | Admitting: Student

## 2023-11-18 DIAGNOSIS — R2681 Unsteadiness on feet: Secondary | ICD-10-CM | POA: Diagnosis not present

## 2023-11-18 DIAGNOSIS — J449 Chronic obstructive pulmonary disease, unspecified: Secondary | ICD-10-CM | POA: Diagnosis not present

## 2023-11-18 DIAGNOSIS — R2689 Other abnormalities of gait and mobility: Secondary | ICD-10-CM | POA: Diagnosis not present

## 2023-11-18 DIAGNOSIS — Z9181 History of falling: Secondary | ICD-10-CM | POA: Diagnosis not present

## 2023-11-18 DIAGNOSIS — J189 Pneumonia, unspecified organism: Secondary | ICD-10-CM | POA: Diagnosis not present

## 2023-11-18 DIAGNOSIS — G20A1 Parkinson's disease without dyskinesia, without mention of fluctuations: Secondary | ICD-10-CM | POA: Diagnosis not present

## 2023-11-18 DIAGNOSIS — M6281 Muscle weakness (generalized): Secondary | ICD-10-CM | POA: Diagnosis not present

## 2023-11-18 DIAGNOSIS — I4891 Unspecified atrial fibrillation: Secondary | ICD-10-CM | POA: Diagnosis not present

## 2023-11-18 DIAGNOSIS — I5033 Acute on chronic diastolic (congestive) heart failure: Secondary | ICD-10-CM | POA: Diagnosis not present

## 2023-11-20 DIAGNOSIS — Z9181 History of falling: Secondary | ICD-10-CM | POA: Diagnosis not present

## 2023-11-20 DIAGNOSIS — I5033 Acute on chronic diastolic (congestive) heart failure: Secondary | ICD-10-CM | POA: Diagnosis not present

## 2023-11-20 DIAGNOSIS — M6281 Muscle weakness (generalized): Secondary | ICD-10-CM | POA: Diagnosis not present

## 2023-11-20 DIAGNOSIS — R2681 Unsteadiness on feet: Secondary | ICD-10-CM | POA: Diagnosis not present

## 2023-11-20 DIAGNOSIS — G20A1 Parkinson's disease without dyskinesia, without mention of fluctuations: Secondary | ICD-10-CM | POA: Diagnosis not present

## 2023-11-20 DIAGNOSIS — I4891 Unspecified atrial fibrillation: Secondary | ICD-10-CM | POA: Diagnosis not present

## 2023-11-20 DIAGNOSIS — J449 Chronic obstructive pulmonary disease, unspecified: Secondary | ICD-10-CM | POA: Diagnosis not present

## 2023-11-20 DIAGNOSIS — R2689 Other abnormalities of gait and mobility: Secondary | ICD-10-CM | POA: Diagnosis not present

## 2023-11-20 DIAGNOSIS — J189 Pneumonia, unspecified organism: Secondary | ICD-10-CM | POA: Diagnosis not present

## 2023-11-22 DIAGNOSIS — R2681 Unsteadiness on feet: Secondary | ICD-10-CM | POA: Diagnosis not present

## 2023-11-22 DIAGNOSIS — J189 Pneumonia, unspecified organism: Secondary | ICD-10-CM | POA: Diagnosis not present

## 2023-11-22 DIAGNOSIS — G20A1 Parkinson's disease without dyskinesia, without mention of fluctuations: Secondary | ICD-10-CM | POA: Diagnosis not present

## 2023-11-22 DIAGNOSIS — R2689 Other abnormalities of gait and mobility: Secondary | ICD-10-CM | POA: Diagnosis not present

## 2023-11-22 DIAGNOSIS — Z9181 History of falling: Secondary | ICD-10-CM | POA: Diagnosis not present

## 2023-11-22 DIAGNOSIS — I4891 Unspecified atrial fibrillation: Secondary | ICD-10-CM | POA: Diagnosis not present

## 2023-11-22 DIAGNOSIS — I5033 Acute on chronic diastolic (congestive) heart failure: Secondary | ICD-10-CM | POA: Diagnosis not present

## 2023-11-22 DIAGNOSIS — M6281 Muscle weakness (generalized): Secondary | ICD-10-CM | POA: Diagnosis not present

## 2023-11-22 DIAGNOSIS — J449 Chronic obstructive pulmonary disease, unspecified: Secondary | ICD-10-CM | POA: Diagnosis not present

## 2023-11-24 NOTE — Progress Notes (Signed)
 Cardiology Clinic Note   Date: 11/28/2023 ID: [name], [name] [birth year], MRN [id number]  Primary Cardiologist:  Deatrice Cage, MD  Patient Profile    Crystal Bass is a 82 y.o. female who presents to the clinic today for routine follow up.     Past medical history significant for: Chronic HFpEF. Limited echo 09/05/2023: EF 55 to 60%.  No RWMA.  Moderate LVH.  Indeterminate diastolic parameters.  Mild RVH.  Normal RV function.  Moderate LAE.  Mitral valve clip.  Moderate MR.  Moderate MAC.  Mild to moderate atrial septal defect with predominantly left-to-right shunting across the atrial septum. PAF. Onset October 2024. TEE/cardioversion 09/05/2023. Mitral valve prolapse. R/LHC 11/14/2021: Normal coronary arteries.  Normal LV systolic function.  Moderate to severe MR by LV angiography and severe by hemodynamic evaluation (giant V waves).  Right heart cath showed mildly elevated filling pressures, moderate pulmonary hypertension and normal cardiac output. TEER of the mitral valve with MitraClip x 2 02/15/2022. Repeat TEER of the mitral valve with MitraClip G4 XTW device 04/03/2023 Echo 05/15/2023: EF 60 to 65%.  No RWMA.  Mild LVH.  Indeterminate diastolic parameters.  Normal RV function.  Mild RVH.  Mildly elevated PA pressure.  There is a MitraClip present in the mitral position, average mean gradient 4 mmHg with HR 56 bpm.  Moderate MR that appears slightly more significant compared to prior echo.  Severe LAE.  Iatrogenic ASD present with left-to-right shunting moderate RAE.  Aortic valve sclerosis/calcification without stenosis.  Mild dilatation of ascending aorta 41 mm. TEE 09/05/2023: EF 50 to 55%.  No RWMA.  Normal RV function.  Severe LAE.  No LA/LAA thrombus.  Mild RAE.  Moderate MR, mean gradient 6 mmHg.  Mild to moderate TR.  Mild AI.  Evidence of atrial level shunting left to right likely from prior septal puncture during MitraClip. Hypertension. Orthostatic  hypotension. COPD. Parkinson disease. PE. Breast cancer. S/p mastectomy.   In summary, patient has a history of mitral valve prolapse with severe MR, chest pain with normal coronary arteries per angiography December 2022, orthostatic hypotension, COPD without a smoking history, and Parkinson's.  Hospital admission in December 2019 for atypical chest pain.  Initial CT showed PE and the apical segment branch of the left upper lobe with evidence of bronchiectasis.  Repeat CT 2 days later showed resolution of the PE.  She was treated with Eliquis  for 3 months.  She had worsening exertional dyspnea in 2022.  Echo October 2022 showed normal LV function, Grade II DD, small pericardial effusion, moderate to severe MR due to significant prolapse of anterior mitral leaflet.  She underwent [facility] March 2023 which showed normal coronary arteries and moderate to severe MR.  She underwent MitraClip placement times 25 January 2022 with minimal improvement in symptoms and subsequent echo showing evidence of severe MR confirmed by TEE.  She was scheduled for placement of a third MitraClip in July 2023 however canceled given risks of mitral stenosis.  In September 2023 she suffered a fall resulting in left hip fracture requiring left hip hemiarthroplasty.  Echo at that time showed normal LV function, grade 3 DD, mild LVH, moderate to severe MR and moderate mitral stenosis.  She was discharged to inpatient rehab with plan to eventually return home.   Patient was evaluated by structural heart in March 2024 for 1 year follow-up following TEER.  At that time she continued to complain of poor quality of life with decreased ability to perform ADLs  secondary to fatigue and dyspnea requiring her to move from independent to assisted living at [facility].  She underwent repeat TEER in May 2024.  Upon follow-up June 2024 she reported improvement however continued to struggle with low energy.   Patient was seen in the office by Dr. Darron  on 08/27/2023 for routine follow-up.  She reported noticing increased dyspnea and palpitations over the week prior.  She was noted to be in A-fib with RVR.  She was started on reduced dose Eliquis  in anticipation for cardioversion in 3 weeks time.  She was noted to be mildly volume overloaded felt to be secondary to A-fib with RVR.   Patient presented to the ED on 09/04/2023 with complaints of shortness of breath and increased lower extremity edema.  EKG demonstrated A-fib with RVR, 138 bpm.  Started on IV diltiazem  and diuresed with IV Lasix .  She underwent successful TEE cardioversion on 09/05/2023.  Diltiazem  was stopped and patient was placed on IV amiodarone  with plan to transition to oral amiodarone .  Patient was discharged on 09/09/2023.     History of Present Illness    Crystal Bass is followed by Dr. Darron for the above outlined history.  Patient was last seen in the office by me on 09/17/2023 for hospital follow-up.  Patient reported improvement in breathing since hospital discharge.  She reported occasional chest pain that she had been experiencing for years starting on the right side of her chest going up to her neck and down left arm lasting 10 to 15 minutes and going away on its own or with Tums.  She reported nursing home noted weakness in the left hand for which she is working with occupational therapy and wearing a brace.  She had declined further imaging.  Today, patient is here with caregiver from SNF. She reports she is doing well. She continues to have stable, brief chest pain that has been unchanged from previous. She reports occasionally feeling an unusual sensation in her heart but no palpitations. She does not have cardiac awareness of afib. She reports her breathing has been good. She does have a little more dyspnea when the weather is cold but it is no worse than usual. Lower extremity edema is well controlled with compression stockings and Lasix . She denies blood in stool or  urine. She continues to work with PT/OT and states I am not improving as fast as I would like. She denies further weakness of her left hand. She is experiencing pain in her right wrist. The nursing home is aware of this.      ROS: All other systems reviewed and are otherwise negative except as noted in History of Present Illness.  EKGs/Labs Reviewed    EKG Interpretation Date/Time:  Thursday November 28 2023 13:32:13 EST Ventricular Rate:  49 PR Interval:  174 QRS Duration:  98 QT Interval:  508 QTC Calculation: 458 R Axis:   73  Text Interpretation: Sinus bradycardia Left ventricular hypertrophy with repolarization abnormality ( Sokolow-Lyon , Cornell product , Romhilt-Estes ) Cannot rule out Septal infarct (cited on or before 17-Sep-2023) When compared with ECG of 17-Sep-2023 15:29, Questionable change in initial forces of Septal leads T wave inversion no longer evident in Anterior leads Confirmed by Loistine Sober [phone]) on 11/28/2023 1:37:20 PM   09/05/2023: ALT 14; AST 21 09/16/2023: BUN 17; Creatinine 0.9; Potassium 3.7; Sodium 140   09/26/2023: Hemoglobin 12.4; WBC 3.8   09/07/2023: TSH 1.145   08/05/2023: Pro B Natriuretic peptide (BNP) 484.0  09/04/2023: B Natriuretic Peptide 1,724.3   Risk Assessment/Calculations     CHA2DS2-VASc Score = 5   This indicates a 7.2% annual risk of stroke. The patient's score is based upon: CHF History: 1 HTN History: 1 Diabetes History: 0 Stroke History: 0 Vascular Disease History: 0 Age Score: 2 Gender Score: 1             Physical Exam    VS:  BP 100/62 (BP Location: Left Arm, Patient Position: Sitting, Cuff Size: Normal)   Pulse (!) 49   Ht 5' 2 (1.575 m)   Wt 124 lb 4 oz (56.4 kg)   SpO2 96%   BMI 22.73 kg/m  , BMI Body mass index is 22.73 kg/m.  GEN: Well nourished, well developed, in no acute distress. Neck: No JVD or carotid bruits. Cardiac:  RRR. 1/6 systolic murmur. No rubs or gallops.   Respiratory:   Respirations regular and unlabored. Clear to auscultation without rales, wheezing or rhonchi. GI: Soft, nontender, nondistended. Extremities: Radials/DP/PT 2+ and equal bilaterally. No clubbing or cyanosis. No edema bilateral lower extremities. Compression stockings in place.   Skin: Warm and dry, no rash. Neuro: Strength intact.  Assessment & Plan   PAF Onset October 2024. Successfully cardioverted on 09/05/2023 during hospital admission. Currently maintaining sinus rhythm.  Patient denies palpitations. She has no cardiac awareness of afib. She denies spontaneous breathing concerns. RRR on exam today. EKG shows sinus bradycardia. Amiodarone  surveillance labs performed in October as detailed above.  -Continue Eliquis  and Amiodarone  as prescribed. Appropriate Eliquis  dose.     Chronic HFpEF Echo October 2024 showed EF 55-60%, moderate LVH, mild RVH, moderate LAE. Patient with recent hospital admission for heart failure exacerbation in the setting of afib with RVR. Patient reports mild edema to lower extremities which she manages with elevation and compression. She denies orthopnea or PND. No lower extremity edema today. Compression socks in place. Euvolemic and well compensated on exam. GDMT limited secondary to orthostatic hypotension. - Continue Lasix .   Left sided Weakness Patient reported left hand weakness at last visit in October 2024. She has been working with OT and it has resolved. Grips 5/5 and equal bilaterally.  -Continue with occupational therapy.   Mitral valve prolapse/MR S/p MitraClip x 25 January 2022 and repeat TEER with MitraClip May 2024. TEE October 2024 showed moderate MR, mean gradient 6 mmHg. Patient reports improving shortness of breath. No lower extremity edema today.  -Continue to follow with structural heart (scheduled May 2025).  Disposition: Return in 6 months or sooner as needed.          Signed, Barnie HERO. Lani Havlik, DNP, NP-C

## 2023-11-28 ENCOUNTER — Other Ambulatory Visit: Payer: Medicare PPO

## 2023-11-28 ENCOUNTER — Ambulatory Visit: Payer: Medicare PPO | Attending: Student | Admitting: Student

## 2023-11-28 ENCOUNTER — Encounter: Payer: Self-pay | Admitting: Student

## 2023-11-28 VITALS — BP 100/62 | HR 49 | Ht 62.0 in | Wt 124.2 lb

## 2023-11-28 DIAGNOSIS — Z95818 Presence of other cardiac implants and grafts: Secondary | ICD-10-CM | POA: Diagnosis not present

## 2023-11-28 DIAGNOSIS — R079 Chest pain, unspecified: Secondary | ICD-10-CM

## 2023-11-28 DIAGNOSIS — I10 Essential (primary) hypertension: Secondary | ICD-10-CM | POA: Diagnosis not present

## 2023-11-28 DIAGNOSIS — I480 Paroxysmal atrial fibrillation: Secondary | ICD-10-CM

## 2023-11-28 DIAGNOSIS — I5032 Chronic diastolic (congestive) heart failure: Secondary | ICD-10-CM

## 2023-11-28 DIAGNOSIS — I340 Nonrheumatic mitral (valve) insufficiency: Secondary | ICD-10-CM

## 2023-11-28 DIAGNOSIS — I110 Hypertensive heart disease with heart failure: Secondary | ICD-10-CM | POA: Diagnosis not present

## 2023-11-28 DIAGNOSIS — R2681 Unsteadiness on feet: Secondary | ICD-10-CM | POA: Diagnosis not present

## 2023-11-28 DIAGNOSIS — Z9889 Other specified postprocedural states: Secondary | ICD-10-CM

## 2023-11-28 DIAGNOSIS — F419 Anxiety disorder, unspecified: Secondary | ICD-10-CM | POA: Diagnosis not present

## 2023-11-28 DIAGNOSIS — I4891 Unspecified atrial fibrillation: Secondary | ICD-10-CM | POA: Diagnosis not present

## 2023-11-28 DIAGNOSIS — I341 Nonrheumatic mitral (valve) prolapse: Secondary | ICD-10-CM | POA: Diagnosis not present

## 2023-11-28 DIAGNOSIS — M6281 Muscle weakness (generalized): Secondary | ICD-10-CM | POA: Diagnosis not present

## 2023-11-28 DIAGNOSIS — G20A1 Parkinson's disease without dyskinesia, without mention of fluctuations: Secondary | ICD-10-CM | POA: Diagnosis not present

## 2023-11-28 DIAGNOSIS — Z741 Need for assistance with personal care: Secondary | ICD-10-CM | POA: Diagnosis not present

## 2023-11-28 DIAGNOSIS — J449 Chronic obstructive pulmonary disease, unspecified: Secondary | ICD-10-CM | POA: Diagnosis not present

## 2023-11-28 DIAGNOSIS — I5033 Acute on chronic diastolic (congestive) heart failure: Secondary | ICD-10-CM | POA: Diagnosis not present

## 2023-11-28 NOTE — Patient Instructions (Signed)
 Medication Instructions:  Your Physician recommend you continue on your current medication as directed.    *If you need a refill on your cardiac medications before your next appointment, please call your pharmacy*   Lab Work: None ordered at this time  If you have labs (blood work) drawn today and your tests are completely normal, you will receive your results only by: MyChart Message (if you have MyChart) OR A paper copy in the mail If you have any lab test that is abnormal or we need to change your treatment, we will call you to review the results.   Testing/Procedures: None ordered at this time    Follow-Up: At [facility], you and your health needs are our priority.  As part of our continuing mission to provide you with exceptional heart care, we have created designated Provider Care Teams.  These Care Teams include your primary Cardiologist (physician) and Advanced Practice Providers (APPs -  Physician Assistants and Nurse Practitioners) who all work together to provide you with the care you need, when you need it.  We recommend signing up for the patient portal called MyChart.  Sign up information is provided on this After Visit Summary.  MyChart is used to connect with patients for Virtual Visits (Telemedicine).  Patients are able to view lab/test results, encounter notes, upcoming appointments, etc.  Non-urgent messages can be sent to your provider as well.   To learn more about what you can do with MyChart, go to [email or web address].    Your next appointment:   6 month(s)  Provider:   You may see Deatrice Cage, MD or one of the following Advanced Practice Providers on your designated Care Team:   Lonni Meager, NP Bernardino Bring, PA-C Cadence Franchester, PA-C Tylene Lunch, NP Barnie Hila, NP

## 2023-11-29 DIAGNOSIS — R2681 Unsteadiness on feet: Secondary | ICD-10-CM | POA: Diagnosis not present

## 2023-11-29 DIAGNOSIS — F419 Anxiety disorder, unspecified: Secondary | ICD-10-CM | POA: Diagnosis not present

## 2023-11-29 DIAGNOSIS — Z741 Need for assistance with personal care: Secondary | ICD-10-CM | POA: Diagnosis not present

## 2023-11-29 DIAGNOSIS — I110 Hypertensive heart disease with heart failure: Secondary | ICD-10-CM | POA: Diagnosis not present

## 2023-11-29 DIAGNOSIS — I4891 Unspecified atrial fibrillation: Secondary | ICD-10-CM | POA: Diagnosis not present

## 2023-11-29 DIAGNOSIS — J449 Chronic obstructive pulmonary disease, unspecified: Secondary | ICD-10-CM | POA: Diagnosis not present

## 2023-11-29 DIAGNOSIS — I5033 Acute on chronic diastolic (congestive) heart failure: Secondary | ICD-10-CM | POA: Diagnosis not present

## 2023-11-29 DIAGNOSIS — G20A1 Parkinson's disease without dyskinesia, without mention of fluctuations: Secondary | ICD-10-CM | POA: Diagnosis not present

## 2023-11-29 DIAGNOSIS — M6281 Muscle weakness (generalized): Secondary | ICD-10-CM | POA: Diagnosis not present

## 2023-12-02 ENCOUNTER — Other Ambulatory Visit: Payer: Self-pay | Admitting: Student

## 2023-12-02 DIAGNOSIS — I110 Hypertensive heart disease with heart failure: Secondary | ICD-10-CM | POA: Diagnosis not present

## 2023-12-02 DIAGNOSIS — I4891 Unspecified atrial fibrillation: Secondary | ICD-10-CM | POA: Diagnosis not present

## 2023-12-02 DIAGNOSIS — Z741 Need for assistance with personal care: Secondary | ICD-10-CM | POA: Diagnosis not present

## 2023-12-02 DIAGNOSIS — J449 Chronic obstructive pulmonary disease, unspecified: Secondary | ICD-10-CM | POA: Diagnosis not present

## 2023-12-02 DIAGNOSIS — M6281 Muscle weakness (generalized): Secondary | ICD-10-CM | POA: Diagnosis not present

## 2023-12-02 DIAGNOSIS — F419 Anxiety disorder, unspecified: Secondary | ICD-10-CM | POA: Diagnosis not present

## 2023-12-02 DIAGNOSIS — G20A1 Parkinson's disease without dyskinesia, without mention of fluctuations: Secondary | ICD-10-CM

## 2023-12-02 DIAGNOSIS — I5033 Acute on chronic diastolic (congestive) heart failure: Secondary | ICD-10-CM | POA: Diagnosis not present

## 2023-12-02 DIAGNOSIS — R2681 Unsteadiness on feet: Secondary | ICD-10-CM | POA: Diagnosis not present

## 2023-12-02 MED ORDER — CLONAZEPAM 0.5 MG PO TABS: ORAL_TABLET | ORAL | 0 refills | Status: DC

## 2023-12-03 DIAGNOSIS — G20A1 Parkinson's disease without dyskinesia, without mention of fluctuations: Secondary | ICD-10-CM | POA: Diagnosis not present

## 2023-12-03 DIAGNOSIS — I110 Hypertensive heart disease with heart failure: Secondary | ICD-10-CM | POA: Diagnosis not present

## 2023-12-03 DIAGNOSIS — M6281 Muscle weakness (generalized): Secondary | ICD-10-CM | POA: Diagnosis not present

## 2023-12-03 DIAGNOSIS — R2681 Unsteadiness on feet: Secondary | ICD-10-CM | POA: Diagnosis not present

## 2023-12-03 DIAGNOSIS — J449 Chronic obstructive pulmonary disease, unspecified: Secondary | ICD-10-CM | POA: Diagnosis not present

## 2023-12-03 DIAGNOSIS — I5033 Acute on chronic diastolic (congestive) heart failure: Secondary | ICD-10-CM | POA: Diagnosis not present

## 2023-12-03 DIAGNOSIS — Z741 Need for assistance with personal care: Secondary | ICD-10-CM | POA: Diagnosis not present

## 2023-12-03 DIAGNOSIS — I4891 Unspecified atrial fibrillation: Secondary | ICD-10-CM | POA: Diagnosis not present

## 2023-12-03 DIAGNOSIS — F419 Anxiety disorder, unspecified: Secondary | ICD-10-CM | POA: Diagnosis not present

## 2023-12-04 DIAGNOSIS — I4891 Unspecified atrial fibrillation: Secondary | ICD-10-CM | POA: Diagnosis not present

## 2023-12-04 DIAGNOSIS — R2681 Unsteadiness on feet: Secondary | ICD-10-CM | POA: Diagnosis not present

## 2023-12-04 DIAGNOSIS — M6281 Muscle weakness (generalized): Secondary | ICD-10-CM | POA: Diagnosis not present

## 2023-12-04 DIAGNOSIS — G20A1 Parkinson's disease without dyskinesia, without mention of fluctuations: Secondary | ICD-10-CM | POA: Diagnosis not present

## 2023-12-04 DIAGNOSIS — Z741 Need for assistance with personal care: Secondary | ICD-10-CM | POA: Diagnosis not present

## 2023-12-04 DIAGNOSIS — J449 Chronic obstructive pulmonary disease, unspecified: Secondary | ICD-10-CM | POA: Diagnosis not present

## 2023-12-04 DIAGNOSIS — F419 Anxiety disorder, unspecified: Secondary | ICD-10-CM | POA: Diagnosis not present

## 2023-12-04 DIAGNOSIS — I5033 Acute on chronic diastolic (congestive) heart failure: Secondary | ICD-10-CM | POA: Diagnosis not present

## 2023-12-04 DIAGNOSIS — I110 Hypertensive heart disease with heart failure: Secondary | ICD-10-CM | POA: Diagnosis not present

## 2023-12-05 DIAGNOSIS — M6281 Muscle weakness (generalized): Secondary | ICD-10-CM | POA: Diagnosis not present

## 2023-12-05 DIAGNOSIS — J449 Chronic obstructive pulmonary disease, unspecified: Secondary | ICD-10-CM | POA: Diagnosis not present

## 2023-12-05 DIAGNOSIS — I4891 Unspecified atrial fibrillation: Secondary | ICD-10-CM | POA: Diagnosis not present

## 2023-12-05 DIAGNOSIS — F419 Anxiety disorder, unspecified: Secondary | ICD-10-CM | POA: Diagnosis not present

## 2023-12-05 DIAGNOSIS — G20A1 Parkinson's disease without dyskinesia, without mention of fluctuations: Secondary | ICD-10-CM | POA: Diagnosis not present

## 2023-12-05 DIAGNOSIS — R2681 Unsteadiness on feet: Secondary | ICD-10-CM | POA: Diagnosis not present

## 2023-12-05 DIAGNOSIS — Z741 Need for assistance with personal care: Secondary | ICD-10-CM | POA: Diagnosis not present

## 2023-12-05 DIAGNOSIS — I5033 Acute on chronic diastolic (congestive) heart failure: Secondary | ICD-10-CM | POA: Diagnosis not present

## 2023-12-05 DIAGNOSIS — I110 Hypertensive heart disease with heart failure: Secondary | ICD-10-CM | POA: Diagnosis not present

## 2023-12-06 ENCOUNTER — Encounter: Payer: Medicare PPO | Admitting: Family Medicine

## 2023-12-06 DIAGNOSIS — I5033 Acute on chronic diastolic (congestive) heart failure: Secondary | ICD-10-CM | POA: Diagnosis not present

## 2023-12-06 DIAGNOSIS — Z741 Need for assistance with personal care: Secondary | ICD-10-CM | POA: Diagnosis not present

## 2023-12-06 DIAGNOSIS — F419 Anxiety disorder, unspecified: Secondary | ICD-10-CM | POA: Diagnosis not present

## 2023-12-06 DIAGNOSIS — I110 Hypertensive heart disease with heart failure: Secondary | ICD-10-CM | POA: Diagnosis not present

## 2023-12-06 DIAGNOSIS — J449 Chronic obstructive pulmonary disease, unspecified: Secondary | ICD-10-CM | POA: Diagnosis not present

## 2023-12-06 DIAGNOSIS — M6281 Muscle weakness (generalized): Secondary | ICD-10-CM | POA: Diagnosis not present

## 2023-12-06 DIAGNOSIS — I4891 Unspecified atrial fibrillation: Secondary | ICD-10-CM | POA: Diagnosis not present

## 2023-12-06 DIAGNOSIS — R2681 Unsteadiness on feet: Secondary | ICD-10-CM | POA: Diagnosis not present

## 2023-12-06 DIAGNOSIS — G20A1 Parkinson's disease without dyskinesia, without mention of fluctuations: Secondary | ICD-10-CM | POA: Diagnosis not present

## 2023-12-07 DIAGNOSIS — M6281 Muscle weakness (generalized): Secondary | ICD-10-CM | POA: Diagnosis not present

## 2023-12-07 DIAGNOSIS — I5033 Acute on chronic diastolic (congestive) heart failure: Secondary | ICD-10-CM | POA: Diagnosis not present

## 2023-12-07 DIAGNOSIS — Z741 Need for assistance with personal care: Secondary | ICD-10-CM | POA: Diagnosis not present

## 2023-12-07 DIAGNOSIS — I4891 Unspecified atrial fibrillation: Secondary | ICD-10-CM | POA: Diagnosis not present

## 2023-12-07 DIAGNOSIS — R2681 Unsteadiness on feet: Secondary | ICD-10-CM | POA: Diagnosis not present

## 2023-12-07 DIAGNOSIS — I110 Hypertensive heart disease with heart failure: Secondary | ICD-10-CM | POA: Diagnosis not present

## 2023-12-07 DIAGNOSIS — G20A1 Parkinson's disease without dyskinesia, without mention of fluctuations: Secondary | ICD-10-CM | POA: Diagnosis not present

## 2023-12-07 DIAGNOSIS — J449 Chronic obstructive pulmonary disease, unspecified: Secondary | ICD-10-CM | POA: Diagnosis not present

## 2023-12-07 DIAGNOSIS — F419 Anxiety disorder, unspecified: Secondary | ICD-10-CM | POA: Diagnosis not present

## 2023-12-09 DIAGNOSIS — I4891 Unspecified atrial fibrillation: Secondary | ICD-10-CM | POA: Diagnosis not present

## 2023-12-09 DIAGNOSIS — I5033 Acute on chronic diastolic (congestive) heart failure: Secondary | ICD-10-CM | POA: Diagnosis not present

## 2023-12-09 DIAGNOSIS — M6281 Muscle weakness (generalized): Secondary | ICD-10-CM | POA: Diagnosis not present

## 2023-12-09 DIAGNOSIS — J449 Chronic obstructive pulmonary disease, unspecified: Secondary | ICD-10-CM | POA: Diagnosis not present

## 2023-12-09 DIAGNOSIS — I110 Hypertensive heart disease with heart failure: Secondary | ICD-10-CM | POA: Diagnosis not present

## 2023-12-09 DIAGNOSIS — R2681 Unsteadiness on feet: Secondary | ICD-10-CM | POA: Diagnosis not present

## 2023-12-09 DIAGNOSIS — G20A1 Parkinson's disease without dyskinesia, without mention of fluctuations: Secondary | ICD-10-CM | POA: Diagnosis not present

## 2023-12-09 DIAGNOSIS — Z741 Need for assistance with personal care: Secondary | ICD-10-CM | POA: Diagnosis not present

## 2023-12-09 DIAGNOSIS — F419 Anxiety disorder, unspecified: Secondary | ICD-10-CM | POA: Diagnosis not present

## 2023-12-10 ENCOUNTER — Non-Acute Institutional Stay (SKILLED_NURSING_FACILITY): Payer: Self-pay | Admitting: Nurse Practitioner

## 2023-12-10 ENCOUNTER — Encounter: Payer: Self-pay | Admitting: Nurse Practitioner

## 2023-12-10 DIAGNOSIS — F419 Anxiety disorder, unspecified: Secondary | ICD-10-CM | POA: Diagnosis not present

## 2023-12-10 DIAGNOSIS — I4891 Unspecified atrial fibrillation: Secondary | ICD-10-CM | POA: Diagnosis not present

## 2023-12-10 DIAGNOSIS — I110 Hypertensive heart disease with heart failure: Secondary | ICD-10-CM | POA: Diagnosis not present

## 2023-12-10 DIAGNOSIS — K219 Gastro-esophageal reflux disease without esophagitis: Secondary | ICD-10-CM | POA: Diagnosis not present

## 2023-12-10 DIAGNOSIS — G8929 Other chronic pain: Secondary | ICD-10-CM

## 2023-12-10 DIAGNOSIS — K5900 Constipation, unspecified: Secondary | ICD-10-CM

## 2023-12-10 DIAGNOSIS — I10 Essential (primary) hypertension: Secondary | ICD-10-CM | POA: Diagnosis not present

## 2023-12-10 DIAGNOSIS — J4489 Other specified chronic obstructive pulmonary disease: Secondary | ICD-10-CM

## 2023-12-10 DIAGNOSIS — M6281 Muscle weakness (generalized): Secondary | ICD-10-CM | POA: Diagnosis not present

## 2023-12-10 DIAGNOSIS — G20A1 Parkinson's disease without dyskinesia, without mention of fluctuations: Secondary | ICD-10-CM

## 2023-12-10 DIAGNOSIS — M792 Neuralgia and neuritis, unspecified: Secondary | ICD-10-CM

## 2023-12-10 DIAGNOSIS — Z741 Need for assistance with personal care: Secondary | ICD-10-CM | POA: Diagnosis not present

## 2023-12-10 DIAGNOSIS — I5033 Acute on chronic diastolic (congestive) heart failure: Secondary | ICD-10-CM | POA: Diagnosis not present

## 2023-12-10 DIAGNOSIS — J449 Chronic obstructive pulmonary disease, unspecified: Secondary | ICD-10-CM | POA: Diagnosis not present

## 2023-12-10 DIAGNOSIS — R2681 Unsteadiness on feet: Secondary | ICD-10-CM | POA: Diagnosis not present

## 2023-12-10 NOTE — Progress Notes (Signed)
 Location:  Other [place].  Nursing Home Room Number: [facility] 506 A Place of Service:  SNF [phone]) [name], NP  PCP: Abdul Fine, MD  Patient Care Team: Abdul Fine, MD as PCP - General (Family Medicine) Darron Deatrice LABOR, MD as PCP - Cardiology (Cardiology) Frutoso Luz, MD as Referring Physician (Allergy ) Cleotilde Ronal RAMAN, MD as Consulting Physician (Gynecology) Tat, Asberry RAMAN, DO as Consulting Physician (Neurology) Odean Potts, MD as Consulting Physician (Hematology and Oncology) Tat, Asberry RAMAN, DO as Consulting Physician (Neurology) Fate Morna SAILOR, [facility] (Inactive) as Pharmacist (Pharmacist)  Extended Emergency Contact Information Primary Emergency Contact: Lawernce Oneil CROISSANT [place], [place] [address] [place] Home Phone: [phone] Mobile Phone: [phone] Relation: Brother Secondary Emergency Contact: [facility] Phone: [phone] Mobile Phone: [phone] Relation: Niece Preferred language: English Interpreter needed? No  Goals of care: Advanced Directive information    12/10/2023   11:52 AM  Advanced Directives  Does Patient Have a Medical Advance Directive? Yes  Type of Estate Agent of [place];Out of facility DNR (pink MOST or yellow form);Living will  Does patient want to make changes to medical advance directive? No - Patient declined  Copy of Healthcare Power of Attorney in Chart? Yes - validated most recent copy scanned in chart (See row information)     Chief Complaint  Patient presents with   Medical Management of Chronic Issues    Medical Management of Chronic Issues.     HPI:  Pt is a 83 y.o. female seen today for medical management of chronic disease.  Pt with hx of parkinson, COPD, asthma, GERD, constipation, a fib, OP, anxiety, neuropathy.  Pt reports she is doing well. Sleeps well at night Occasionally constipation which is controlled on medication and PRN GERD  controlled on medication Reports progressive tremor with parkinson's, continues with weighted utensils  She continues therapy.   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 10/2021 Cath: Nl cors.   Arthritis    Asthma    Breast cancer (HCC) 2002   infiltrative ductal carcinoma    Cataract 2019   Chronic heart failure with preserved ejection fraction (HFpEF) (HCC)    COPD (chronic obstructive pulmonary disease) (HCC) 07/2007   Coronary artery disease    Ductal carcinoma of breast, estrogen receptor positive, stage 1 (HCC) 09/16/2012   Endometriosis    GERD (gastroesophageal reflux disease)    Hx of mitral valve repair    Hypercalcemia 09/14/2013   Hypertension    Lesion of breast 1992   right, benign   Lung disease    secondary to [name] infection   Mastalgia 05/1994   Mitral regurgitation    a. 10/2021 RHC: PCWP V wave of 34; b. 01/2022 TEE: Severe flail/prolapse of large area of A2 scallop with severe posteriorly directed MR; c. 01/2022 TEER w /Mitraclip XTW x 2; d. 07/2022 Echo: EF 60-65%. No rwma, mild LVH, GrIII DD. Sev dil LA. Mod elev PASP. Mod-Sev MR. Mod MS.   Moderate mitral regurgitation by prior echocardiogram    Osteoporosis, post-menopausal 03/14/2012   Ovarian tumor of borderline malignancy, right 2004   Palpitations    a. 02/2022 Zio: Predominantly sinus rhythm at 67 (52-171).  2 runs of NSVT -fastest 9 beats at 167.  35 SVT runs-longest 19 beats, fastest 171x6 beats.  Pancreatitis    secondary to Cholelithiasis   Parkinson disease (HCC) 02/10/2015   Post-thoracotomy pain syndrome    Pseudomonas pneumonia (HCC) 03/2008   Status post implantation of mitral valve leaflet clip 02/15/2022   s/p XTW MitraClip x2 by Dr. Wonda in the A2/P2: b. 04/03/23 s/p third mitraclip implantation.   Traumatic closed fracture of distal clavicle with minimal displacement, left, initial encounter  06/2021   [facility]   Uterine fibroid    Vitamin D  deficiency    Past Surgical History:  Procedure Laterality Date   ABDOMINAL HYSTERECTOMY     & BSO for Mucinous borderline tumor of R ovary 2004   APPENDECTOMY  2004   BREAST BIOPSY  08/2004   right breast-benign   BREAST LUMPECTOMY  1992   benign   BUBBLE STUDY  12/27/2021   Procedure: BUBBLE STUDY;  Surgeon: Barbaraann Darryle Ned, MD;  Location: [facility] ENDOSCOPY;  Service: Cardiovascular;;   CARDIAC CATHETERIZATION  2007   essentially negation for significant CAD   CARDIOVERSION N/A 09/05/2023   Procedure: CARDIOVERSION;  Surgeon: Darliss Rogue, MD;  Location: [facility] ORS;  Service: Cardiovascular;  Laterality: N/A;   CATARACT EXTRACTION W/PHACO Left 07/24/2023   Procedure: CATARACT EXTRACTION PHACO AND INTRAOCULAR LENS PLACEMENT (IOC) LEFT 5.34 0037.3;  Surgeon: Mittie Gaskin, MD;  Location: [facility] SURGERY CNTR;  Service: Ophthalmology;  Laterality: Left;   CATARACT EXTRACTION W/PHACO Right 08/07/2023   Procedure: CATARACT EXTRACTION PHACO AND INTRAOCULAR LENS PLACEMENT (IOC) RIGHT 7.83 00:47.9;  Surgeon: Mittie Gaskin, MD;  Location: [facility] SURGERY CNTR;  Service: Ophthalmology;  Laterality: Right;   CHOLECYSTECTOMY  2001   COLONOSCOPY  2014   Dr Abran , due 2019   COLONOSCOPY  09/2018   2 TA removed, int hem, no f/u needed (Dr Abran)    ESOPHAGOGASTRODUODENOSCOPY  09/2018   GERD with esophagitis and stricture dilated, antral gastritis negative for H pylori Oletta)   HIP ARTHROPLASTY Left 08/21/2022   Procedure: ARTHROPLASTY BIPOLAR HIP (HEMIARTHROPLASTY);  Surgeon: Marchia Drivers, MD;  Location: [facility] ORS;  Service: Orthopedics;  Laterality: Left;   LUNG BIOPSY  2002   [name], Dr Brantley   MASTECTOMY MODIFIED RADICAL Left 2002   oral chemotheraphy (tamoxifen then Armidex) no radiation, Dr.[name]   RIGHT/LEFT HEART CATH AND CORONARY ANGIOGRAPHY Bilateral 11/14/2021   Procedure: RIGHT/LEFT HEART CATH AND CORONARY  ANGIOGRAPHY;  Surgeon: Darron Deatrice LABOR, MD;  Location: [facility] INVASIVE CV LAB;  Service: Cardiovascular;  Laterality: Bilateral;   TEE WITHOUT CARDIOVERSION N/A 12/29/2018   Procedure: TRANSESOPHAGEAL ECHOCARDIOGRAM (TEE);  Surgeon: Darron Deatrice LABOR, MD;  Location: [facility] ORS;  Service: Cardiovascular;  Laterality: N/A;   TEE WITHOUT CARDIOVERSION N/A 12/27/2021   Procedure: TRANSESOPHAGEAL ECHOCARDIOGRAM (TEE);  Surgeon: Barbaraann Darryle Ned, MD;  Location: [facility] ENDOSCOPY;  Service: Cardiovascular;  Laterality: N/A;   TEE WITHOUT CARDIOVERSION N/A 02/15/2022   Procedure: TRANSESOPHAGEAL ECHOCARDIOGRAM (TEE);  Surgeon: Wonda Sharper, MD;  Location: [facility] INVASIVE CV LAB;  Service: Cardiovascular;  Laterality: N/A;   TEE WITHOUT CARDIOVERSION N/A 05/14/2022   Procedure: TRANSESOPHAGEAL ECHOCARDIOGRAM (TEE);  Surgeon: Barbaraann Darryle Ned, MD;  Location: [facility] ENDOSCOPY;  Service: Cardiovascular;  Laterality: N/A;   TEE WITHOUT CARDIOVERSION N/A 04/03/2023   Procedure: TRANSESOPHAGEAL ECHOCARDIOGRAM;  Surgeon: Wonda Sharper, MD;  Location: [facility] INVASIVE CV LAB;  Service: Cardiovascular;  Laterality: N/A;   TEE WITHOUT CARDIOVERSION N/A 09/05/2023   Procedure: TRANSESOPHAGEAL ECHOCARDIOGRAM;  Surgeon: Darliss Rogue, MD;  Location: [facility] ORS;  Service: Cardiovascular;  Laterality: N/A;   TONSILLECTOMY  1946   TRANSCATHETER  MITRAL EDGE TO EDGE REPAIR N/A 02/15/2022   Procedure: MITRAL VALVE REPAIR;  Surgeon: Wonda Sharper, MD;  Location: [facility] INVASIVE CV LAB;  Service: Cardiovascular;  Laterality: N/A;   TRANSCATHETER MITRAL EDGE TO EDGE REPAIR N/A 04/03/2023   Procedure: MITRAL VALVE REPAIR;  Surgeon: Wonda Sharper, MD;  Location: [facility] INVASIVE CV LAB;  Service: Cardiovascular;  Laterality: N/A;    Allergies  Allergen Reactions   Sulfa Antibiotics Anaphylaxis   Sulfonamide Derivatives Anaphylaxis   Topamax [Topiramate] Other (See Comments)    Metabolic acidosis    Clarithromycin Other (See Comments)     pericarditis  Other reaction(s): Not available   Hydrocodone  Other (See Comments)    hyper and climbing the walls   Motrin [Ibuprofen] Other (See Comments)    headaches   Misc. Sulfonamide Containing Compounds     Other reaction(s): Not available   Symbicort  [Budesonide -Formoterol  Fumarate] Other (See Comments)    02/07/15 tremor   Tetracycline Hcl Other (See Comments)   Percocet [Oxycodone-Acetaminophen ] Itching and Rash   Tape Itching and Rash    Use paper tape only   Tetracyclines & Related Other (See Comments)    immediate yeast infection    Outpatient Encounter Medications as of 12/10/2023  Medication Sig   acetaminophen  (TYLENOL ) 500 MG tablet Take 1,000 mg by mouth 3 (three) times daily.   albuterol  (VENTOLIN  HFA) 108 (90 Base) MCG/ACT inhaler Inhale 2 puffs into the lungs every 6 (six) hours as needed for wheezing or shortness of breath.   aluminum-magnesium  hydroxide 200-200 MG/5ML suspension Take 10 mLs by mouth every 4 (four) hours as needed for indigestion.   amiodarone  (PACERONE ) 100 MG tablet Take 100 mg by mouth daily.   apixaban  (ELIQUIS ) 2.5 MG TABS tablet Take 1 tablet (2.5 mg total) by mouth 2 (two) times daily.   bisacodyl  (DULCOLAX) 10 MG suppository Place 10 mg rectally as needed for moderate constipation.   calcium  carbonate (TUMS - DOSED IN MG ELEMENTAL CALCIUM ) 500 MG chewable tablet Chew 2 tablets by mouth daily as needed for indigestion or heartburn.   Calcium  Carbonate Antacid 600 MG chewable tablet Chew 600 mg by mouth daily.   Carbidopa -Levodopa  ER (SINEMET  CR) 25-100 MG tablet controlled release TAKE TWO TABLET BY MOUTH AT NINE IN THE MORNING, ONE TAB AT ONE IN THE EVENING AND ONE TAB AT FIVE IN THE EVENING   cetirizine (ZYRTEC) 10 MG tablet Take 10 mg by mouth daily.   Cholecalciferol (VITAMIN D3) 25 MCG (1000 UT) CAPS Take 1 capsule (1,000 Units total) by mouth daily.   clonazePAM  (KLONOPIN ) 0.5 MG tablet TAKE ONE HALF (1/2) A TABLET BY MOUTH AT  BEDTIME   docusate sodium  (COLACE) 100 MG capsule Take 100 mg by mouth 2 (two) times daily.   EPINEPHrine  0.3 mg/0.3 mL IJ SOAJ injection Inject 0.3 mg into the muscle as needed for anaphylaxis.   fluticasone  (FLONASE ) 50 MCG/ACT nasal spray Place into both nostrils.   Fluticasone -Umeclidin-Vilant (TRELEGY ELLIPTA ) 100-62.5-25 MCG/ACT AEPB Inhale 1 puff into the lungs daily.   furosemide  (LASIX ) 40 MG tablet TAKE ONE TABLET BY MOUTH ONCE DAILY *NEW DOSE*   gabapentin  ([name] ) 300 MG capsule TAKE TWO CAPSULES BY MOUTH THREE TIMES DAILY   magnesium  hydroxide (MILK OF MAGNESIA) 400 MG/5ML suspension Take 30 mLs by mouth daily as needed for mild constipation.   pantoprazole  (PROTONIX ) 20 MG tablet Take 1 tablet (20 mg total) by mouth daily.   polyethylene glycol (MIRALAX  / GLYCOLAX ) 17 g packet Take 17 g by  mouth daily as needed.   No facility-administered encounter medications on file as of 12/10/2023.    Review of Systems  Constitutional:  Negative for activity change, appetite change, fatigue and unexpected weight change.  HENT:  Negative for congestion and hearing loss.   Eyes: Negative.   Respiratory:  Negative for cough and shortness of breath.   Cardiovascular:  Negative for chest pain, palpitations and leg swelling.  Gastrointestinal:  Positive for constipation. Negative for abdominal pain and diarrhea.  Genitourinary:  Negative for difficulty urinating and dysuria.  Musculoskeletal:  Positive for arthralgias. Negative for myalgias.  Skin:  Negative for color change and wound.  Neurological:  Positive for tremors. Negative for dizziness and weakness.  Psychiatric/Behavioral:  Negative for agitation, behavioral problems and confusion.      Immunization History  Administered Date(s) Administered   Fluad Quad(high Dose 65+) 08/11/2019, 08/03/2020, 08/08/2021, 09/06/2022   Influenza Split 09/11/2011   Influenza Whole 09/09/2007, 08/08/2008, 08/15/2010, 08/26/2012    Influenza,inj,Quad PF,6+ Mos 08/18/2013, 08/20/2014, 08/25/2018   Influenza-Unspecified 09/27/2015, 08/26/2017, 09/18/2023   Moderna Covid-19 Fall Seasonal Vaccine 58yrs & older 10/25/2022   Moderna Covid-19 Vaccine Bivalent Booster 69yrs & up 04/24/2022, 03/05/2023   Moderna Sars-Covid-2 Vaccination 01/01/2020, 01/20/2020, 02/16/2020, 07/25/2020, 11/22/2020   PPD Test 08/24/2022   Pneumococcal Conjugate-13 02/12/2014   Pneumococcal Polysaccharide-23 11/26/2006, 10/21/2012, 11/22/2020, 11/21/2021   Rsv, Bivalent, Protein Subunit Rsvpref,pf [place]) 10/25/2022   Td 01/01/2011, 06/30/2021   Zoster Recombinant(Shingrix) 02/22/2018, 07/08/2019   Zoster, Live 05/26/2007   Pertinent  Health Maintenance Due  Topic Date Due   INFLUENZA VACCINE  Completed   DEXA SCAN  Completed   Colonoscopy  Discontinued      11/25/2022    8:30 PM 11/26/2022    9:30 AM 12/07/2022   11:43 AM 01/29/2023   10:32 AM 08/01/2023   10:29 AM  Fall Risk  Falls in the past year?   1 1 0  Was there an injury with Fall?   1 1 0  Fall Risk Category Calculator   3 3 0  Fall Risk Category (Retired)   High    (RETIRED) Patient Fall Risk Level High fall risk High fall risk     Fall risk Follow up     Falls evaluation completed   Functional Status Survey:    Vitals:   12/10/23 1140  BP: 115/75  Pulse: (!) 58  Resp: 20  Temp: 97.7 F (36.5 C)  SpO2: 93%  Weight: 119 lb 12.8 oz (54.3 kg)  Height: 5' 2 (1.575 m)   Body mass index is 21.91 kg/m. Physical Exam Constitutional:      General: She is not in acute distress.    Appearance: She is well-developed. She is not diaphoretic.  HENT:     Head: Normocephalic and atraumatic.     Mouth/Throat:     Pharynx: No oropharyngeal exudate.  Eyes:     Conjunctiva/sclera: Conjunctivae normal.     Pupils: Pupils are equal, round, and reactive to light.  Cardiovascular:     Rate and Rhythm: Normal rate and regular rhythm.     Heart sounds: Normal heart sounds.   Pulmonary:     Effort: Pulmonary effort is normal.     Breath sounds: Normal breath sounds.  Abdominal:     General: Bowel sounds are normal.     Palpations: Abdomen is soft.  Musculoskeletal:     Cervical back: Normal range of motion and neck supple.     Right lower leg: No  edema.     Left lower leg: No edema.  Skin:    General: Skin is warm and dry.  Neurological:     Mental Status: She is alert.  Psychiatric:        Mood and Affect: Mood normal.     Labs reviewed: Recent Labs    09/04/23 1057 09/05/23 0521 09/06/23 0345 09/07/23 0313 09/08/23 0417 09/16/23 0000 11/14/23 0000  NA  --    < > 135 135 139 140 143  K  --    < > 4.1 3.6 4.1 3.7 4.4  CL  --    < > 100 100 102 101 101  CO2  --    < > 23 26 26  31* 36*  GLUCOSE  --    < > 149* 111* 84  --   --   BUN  --    < > 27* 27* 23 17 18   CREATININE  --    < > 1.07* 0.82 0.78 0.9 0.8  CALCIUM   --    < > 8.4* 7.8* 8.3* 9.1 9.9  MG 2.1  --   --   --   --   --   --    < > = values in this interval not displayed.   Recent Labs    04/01/23 0816 08/05/23 1114 09/05/23 0521  AST 20 13 21   ALT 5 2 14   ALKPHOS 67 98 80  BILITOT 2.0* 1.0 1.5*  PROT 6.9 7.6 6.7  ALBUMIN  3.8 3.7 3.4*   Recent Labs    09/06/23 0345 09/07/23 0313 09/08/23 0417 09/26/23 0000  WBC 5.2 6.2 6.0 3.8  NEUTROABS 4.5 4.8 4.3  --   HGB 11.9* 11.8* 12.7 12.4  HCT 36.4 36.8 38.9 39  MCV 101.1* 100.0 99.0  --   PLT 145* 140* 162 173   Lab Results  Component Value Date   TSH 1.145 09/07/2023   Lab Results  Component Value Date   HGBA1C 5.3 01/14/2018   Lab Results  Component Value Date   CHOL 171 08/08/2021   HDL 53.30 08/08/2021   LDLCALC 97 08/08/2021   LDLDIRECT 135.0 08/16/2009   TRIG 100.0 08/08/2021   CHOLHDL 3 08/08/2021    Significant Diagnostic Results in last 30 days:  No results found.  Assessment/Plan 1. Parkinson's disease without dyskinesia or fluctuating manifestations (HCC) (Primary) -progressive decline,  continues with support of staff and supportive devices -continues on sinemet  2 tablets TID  2. Chronic obstructive airway disease with asthma (HCC) -stable on current regimen. No recent flares.   3. Atrial fibrillation, unspecified type (HCC) HR was going into the 40s,therefore amiodarone  reduced from 200 -100 mg daily Rate continues to be controlled, staff monitoring VS routinely Continues on eliquis  BID.  4. Essential hypertension -Blood pressure well controlled, goal bp <140/90 Continue current medications and dietary modifications follow metabolic panel  5. Gastroesophageal reflux disease without esophagitis -stable on protonix   6. Chronic neuropathic pain Ongoing but stable, continues on gabapentin  twice daily   7. Constipation, unspecified constipation type -ongoing, continues on colace with miralax  PRN     Kalila Adkison K. Caro BODILY [facility] [phone]

## 2023-12-11 DIAGNOSIS — I5033 Acute on chronic diastolic (congestive) heart failure: Secondary | ICD-10-CM | POA: Diagnosis not present

## 2023-12-11 DIAGNOSIS — G20A1 Parkinson's disease without dyskinesia, without mention of fluctuations: Secondary | ICD-10-CM | POA: Diagnosis not present

## 2023-12-11 DIAGNOSIS — I4891 Unspecified atrial fibrillation: Secondary | ICD-10-CM | POA: Diagnosis not present

## 2023-12-11 DIAGNOSIS — M6281 Muscle weakness (generalized): Secondary | ICD-10-CM | POA: Diagnosis not present

## 2023-12-11 DIAGNOSIS — R2681 Unsteadiness on feet: Secondary | ICD-10-CM | POA: Diagnosis not present

## 2023-12-11 DIAGNOSIS — I110 Hypertensive heart disease with heart failure: Secondary | ICD-10-CM | POA: Diagnosis not present

## 2023-12-11 DIAGNOSIS — Z741 Need for assistance with personal care: Secondary | ICD-10-CM | POA: Diagnosis not present

## 2023-12-11 DIAGNOSIS — J449 Chronic obstructive pulmonary disease, unspecified: Secondary | ICD-10-CM | POA: Diagnosis not present

## 2023-12-11 DIAGNOSIS — F419 Anxiety disorder, unspecified: Secondary | ICD-10-CM | POA: Diagnosis not present

## 2023-12-12 DIAGNOSIS — F419 Anxiety disorder, unspecified: Secondary | ICD-10-CM | POA: Diagnosis not present

## 2023-12-12 DIAGNOSIS — I5033 Acute on chronic diastolic (congestive) heart failure: Secondary | ICD-10-CM | POA: Diagnosis not present

## 2023-12-12 DIAGNOSIS — G20A1 Parkinson's disease without dyskinesia, without mention of fluctuations: Secondary | ICD-10-CM | POA: Diagnosis not present

## 2023-12-12 DIAGNOSIS — J449 Chronic obstructive pulmonary disease, unspecified: Secondary | ICD-10-CM | POA: Diagnosis not present

## 2023-12-12 DIAGNOSIS — R2681 Unsteadiness on feet: Secondary | ICD-10-CM | POA: Diagnosis not present

## 2023-12-12 DIAGNOSIS — M6281 Muscle weakness (generalized): Secondary | ICD-10-CM | POA: Diagnosis not present

## 2023-12-12 DIAGNOSIS — I4891 Unspecified atrial fibrillation: Secondary | ICD-10-CM | POA: Diagnosis not present

## 2023-12-12 DIAGNOSIS — I110 Hypertensive heart disease with heart failure: Secondary | ICD-10-CM | POA: Diagnosis not present

## 2023-12-12 DIAGNOSIS — Z741 Need for assistance with personal care: Secondary | ICD-10-CM | POA: Diagnosis not present

## 2023-12-13 DIAGNOSIS — Z741 Need for assistance with personal care: Secondary | ICD-10-CM | POA: Diagnosis not present

## 2023-12-13 DIAGNOSIS — I4891 Unspecified atrial fibrillation: Secondary | ICD-10-CM | POA: Diagnosis not present

## 2023-12-13 DIAGNOSIS — G20A1 Parkinson's disease without dyskinesia, without mention of fluctuations: Secondary | ICD-10-CM | POA: Diagnosis not present

## 2023-12-13 DIAGNOSIS — I5033 Acute on chronic diastolic (congestive) heart failure: Secondary | ICD-10-CM | POA: Diagnosis not present

## 2023-12-13 DIAGNOSIS — J449 Chronic obstructive pulmonary disease, unspecified: Secondary | ICD-10-CM | POA: Diagnosis not present

## 2023-12-13 DIAGNOSIS — I110 Hypertensive heart disease with heart failure: Secondary | ICD-10-CM | POA: Diagnosis not present

## 2023-12-13 DIAGNOSIS — F419 Anxiety disorder, unspecified: Secondary | ICD-10-CM | POA: Diagnosis not present

## 2023-12-13 DIAGNOSIS — R2681 Unsteadiness on feet: Secondary | ICD-10-CM | POA: Diagnosis not present

## 2023-12-13 DIAGNOSIS — M6281 Muscle weakness (generalized): Secondary | ICD-10-CM | POA: Diagnosis not present

## 2023-12-16 DIAGNOSIS — I4891 Unspecified atrial fibrillation: Secondary | ICD-10-CM | POA: Diagnosis not present

## 2023-12-16 DIAGNOSIS — R2681 Unsteadiness on feet: Secondary | ICD-10-CM | POA: Diagnosis not present

## 2023-12-16 DIAGNOSIS — M6281 Muscle weakness (generalized): Secondary | ICD-10-CM | POA: Diagnosis not present

## 2023-12-16 DIAGNOSIS — F419 Anxiety disorder, unspecified: Secondary | ICD-10-CM | POA: Diagnosis not present

## 2023-12-16 DIAGNOSIS — I5033 Acute on chronic diastolic (congestive) heart failure: Secondary | ICD-10-CM | POA: Diagnosis not present

## 2023-12-16 DIAGNOSIS — Z741 Need for assistance with personal care: Secondary | ICD-10-CM | POA: Diagnosis not present

## 2023-12-16 DIAGNOSIS — I110 Hypertensive heart disease with heart failure: Secondary | ICD-10-CM | POA: Diagnosis not present

## 2023-12-16 DIAGNOSIS — J449 Chronic obstructive pulmonary disease, unspecified: Secondary | ICD-10-CM | POA: Diagnosis not present

## 2023-12-16 DIAGNOSIS — G20A1 Parkinson's disease without dyskinesia, without mention of fluctuations: Secondary | ICD-10-CM | POA: Diagnosis not present

## 2023-12-17 DIAGNOSIS — I4891 Unspecified atrial fibrillation: Secondary | ICD-10-CM | POA: Diagnosis not present

## 2023-12-17 DIAGNOSIS — I110 Hypertensive heart disease with heart failure: Secondary | ICD-10-CM | POA: Diagnosis not present

## 2023-12-17 DIAGNOSIS — G20A1 Parkinson's disease without dyskinesia, without mention of fluctuations: Secondary | ICD-10-CM | POA: Diagnosis not present

## 2023-12-17 DIAGNOSIS — J449 Chronic obstructive pulmonary disease, unspecified: Secondary | ICD-10-CM | POA: Diagnosis not present

## 2023-12-17 DIAGNOSIS — Z741 Need for assistance with personal care: Secondary | ICD-10-CM | POA: Diagnosis not present

## 2023-12-17 DIAGNOSIS — I5033 Acute on chronic diastolic (congestive) heart failure: Secondary | ICD-10-CM | POA: Diagnosis not present

## 2023-12-17 DIAGNOSIS — M6281 Muscle weakness (generalized): Secondary | ICD-10-CM | POA: Diagnosis not present

## 2023-12-17 DIAGNOSIS — F419 Anxiety disorder, unspecified: Secondary | ICD-10-CM | POA: Diagnosis not present

## 2023-12-17 DIAGNOSIS — R2681 Unsteadiness on feet: Secondary | ICD-10-CM | POA: Diagnosis not present

## 2023-12-18 ENCOUNTER — Non-Acute Institutional Stay (SKILLED_NURSING_FACILITY): Payer: Medicare PPO | Admitting: Student

## 2023-12-18 ENCOUNTER — Encounter: Payer: Self-pay | Admitting: Student

## 2023-12-18 DIAGNOSIS — F419 Anxiety disorder, unspecified: Secondary | ICD-10-CM | POA: Diagnosis not present

## 2023-12-18 DIAGNOSIS — Z741 Need for assistance with personal care: Secondary | ICD-10-CM | POA: Diagnosis not present

## 2023-12-18 DIAGNOSIS — R2681 Unsteadiness on feet: Secondary | ICD-10-CM | POA: Diagnosis not present

## 2023-12-18 DIAGNOSIS — J449 Chronic obstructive pulmonary disease, unspecified: Secondary | ICD-10-CM | POA: Diagnosis not present

## 2023-12-18 DIAGNOSIS — G20A1 Parkinson's disease without dyskinesia, without mention of fluctuations: Secondary | ICD-10-CM | POA: Diagnosis not present

## 2023-12-18 DIAGNOSIS — R1031 Right lower quadrant pain: Secondary | ICD-10-CM

## 2023-12-18 DIAGNOSIS — I4891 Unspecified atrial fibrillation: Secondary | ICD-10-CM | POA: Diagnosis not present

## 2023-12-18 DIAGNOSIS — I110 Hypertensive heart disease with heart failure: Secondary | ICD-10-CM | POA: Diagnosis not present

## 2023-12-18 DIAGNOSIS — M6281 Muscle weakness (generalized): Secondary | ICD-10-CM | POA: Diagnosis not present

## 2023-12-18 DIAGNOSIS — I5033 Acute on chronic diastolic (congestive) heart failure: Secondary | ICD-10-CM | POA: Diagnosis not present

## 2023-12-18 NOTE — Progress Notes (Unsigned)
Location:  Other [place].  Nursing Home Room Number: [facility] [address]A Place of Service:  SNF [phone]) Provider:  Earnestine Mealing, MD  Patient Care Team: Earnestine Mealing, MD as PCP - General (Family Medicine) Iran Ouch, MD as PCP - Cardiology (Cardiology) Sidney Ace, MD as Referring Physician (Allergy) Jerene Bears, MD as Consulting Physician (Gynecology) Tat, Octaviano Batty, DO as Consulting Physician (Neurology) Serena Croissant, MD as Consulting Physician (Hematology and Oncology) Tat, Octaviano Batty, DO as Consulting Physician (Neurology) Kathyrn Sheriff, [facility] (Inactive) as Pharmacist (Pharmacist)  Extended Emergency Contact Information Primary Emergency Contact: [name], [place] [address] [place] of [place] Home Phone: [phone] Mobile Phone: [phone] Relation: Brother Secondary Emergency Contact: [facility] Phone: [phone] Mobile Phone: [phone] Relation: Niece Preferred language: English Interpreter needed? No  Code Status:  DNR Goals of care: Advanced Directive information    12/18/2023    4:34 PM  Advanced Directives  Does Patient Have a Medical Advance Directive? Yes  Type of Estate agent of [place];Out of facility DNR (pink MOST or yellow form);Living will  Does patient want to make changes to medical advance directive? No - Patient declined  Copy of Healthcare Power of Attorney in Chart? Yes - validated most recent copy scanned in chart (See row information)     Chief Complaint  Patient presents with   Acute Visit    Left Groin Pain.     HPI:  Pt is a 83 y.o. female seen today for an acute visit for Left Groin Pain.  History of Present Illness Crystal Bass, with a history of multiple cardiac catheterizations, presents with a complaint of a tender spot in the groin area. The tenderness is most pronounced in the mornings and has been persisting for a few weeks. The patient reports  a sensation of a small rise in the area, but no visible changes. There has been no drainage from the area and no interventions such as warm compresses have been used. The patient has not experienced any alarming symptoms such as bloating, difficulty with bowel movements, or vomiting. The patient also mentions a possible hair follicle infection in the area. The discomfort could potentially be related to scar tissue from previous cardiac catheterizations with femoral access.   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 10/2021 Cath: Nl cors.   Arthritis    Asthma    Breast cancer (HCC) 2002   infiltrative ductal carcinoma    Cataract 2019   Chronic heart failure with preserved ejection fraction (HFpEF) (HCC)    COPD (chronic obstructive pulmonary disease) (HCC) 07/2007   Coronary artery disease    Ductal carcinoma of breast, estrogen receptor positive, stage 1 (HCC) 09/16/2012   Endometriosis    GERD (gastroesophageal reflux disease)    Hx of mitral valve repair    Hypercalcemia 09/14/2013   Hypertension    Lesion of breast 1992   right, benign   Lung disease    secondary to [name] infection   Mastalgia 05/1994   Mitral regurgitation    a. 10/2021 RHC: PCWP V wave of 34; b. 01/2022 TEE: Severe flail/prolapse of large area of A2 scallop with severe posteriorly directed MR; c. 01/2022 TEER w /Mitraclip XTW x 2; d. 07/2022 Echo: EF 60-65%. No rwma, mild LVH, GrIII DD.  Sev dil LA. Mod elev PASP. Mod-Sev MR. Mod MS.   Moderate mitral regurgitation by prior echocardiogram    Osteoporosis, post-menopausal 03/14/2012   Ovarian tumor of borderline malignancy, right 2004   Palpitations    a. 02/2022 Zio: Predominantly sinus rhythm at 67 (52-171).  2 runs of NSVT -fastest 9 beats at 167.  35 SVT runs-longest 19 beats, fastest 171x6 beats.   Pancreatitis    secondary to Cholelithiasis   Parkinson disease (HCC)  02/10/2015   Post-thoracotomy pain syndrome    Pseudomonas pneumonia (HCC) 03/2008   Status post implantation of mitral valve leaflet clip 02/15/2022   s/p XTW MitraClip x2 by Dr. Excell Seltzer in the A2/P2: b. 04/03/23 s/p third mitraclip implantation.   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   APPENDECTOMY  2004   BREAST BIOPSY  08/2004   right breast-benign   BREAST LUMPECTOMY  1992   benign   BUBBLE STUDY  12/27/2021   Procedure: BUBBLE STUDY;  Surgeon: Sande Rives, MD;  Location: [facility] ENDOSCOPY;  Service: Cardiovascular;;   CARDIAC CATHETERIZATION  2007   essentially negation for significant CAD   CARDIOVERSION N/A 09/05/2023   Procedure: CARDIOVERSION;  Surgeon: Debbe Odea, MD;  Location: [facility] ORS;  Service: Cardiovascular;  Laterality: N/A;   CATARACT EXTRACTION W/PHACO Left 07/24/2023   Procedure: CATARACT EXTRACTION PHACO AND INTRAOCULAR LENS PLACEMENT (IOC) LEFT 5.34 0037.3;  Surgeon: Lockie Mola, MD;  Location: [facility] SURGERY CNTR;  Service: Ophthalmology;  Laterality: Left;   CATARACT EXTRACTION W/PHACO Right 08/07/2023   Procedure: CATARACT EXTRACTION PHACO AND INTRAOCULAR LENS PLACEMENT (IOC) RIGHT 7.83 00:47.9;  Surgeon: Lockie Mola, MD;  Location: [facility] SURGERY CNTR;  Service: Ophthalmology;  Laterality: Right;   CHOLECYSTECTOMY  2001   COLONOSCOPY  2014   Dr Marina Goodell , due 2019   COLONOSCOPY  09/2018   2 TA removed, int hem, no f/u needed (Dr Marina Goodell)    ESOPHAGOGASTRODUODENOSCOPY  09/2018   GERD with esophagitis and stricture dilated, antral gastritis negative for H pylori Marina Goodell)   HIP ARTHROPLASTY Left 08/21/2022   Procedure: ARTHROPLASTY BIPOLAR HIP (HEMIARTHROPLASTY);  Surgeon: Juanell Fairly, MD;  Location: [facility] ORS;  Service: Orthopedics;  Laterality: Left;   LUNG BIOPSY  2002   [name], Dr Edwyna Shell   MASTECTOMY MODIFIED RADICAL Left 2002   oral chemotheraphy (tamoxifen then Armidex) no radiation, Dr.[name]   RIGHT/LEFT HEART CATH AND CORONARY ANGIOGRAPHY Bilateral 11/14/2021   Procedure: RIGHT/LEFT HEART CATH AND CORONARY ANGIOGRAPHY;  Surgeon: Iran Ouch, MD;  Location: [facility] INVASIVE CV LAB;  Service: Cardiovascular;  Laterality: Bilateral;   TEE WITHOUT CARDIOVERSION N/A 12/29/2018   Procedure: TRANSESOPHAGEAL ECHOCARDIOGRAM (TEE);  Surgeon: Iran Ouch, MD;  Location: [facility] ORS;  Service: Cardiovascular;  Laterality: N/A;   TEE WITHOUT CARDIOVERSION N/A 12/27/2021   Procedure: TRANSESOPHAGEAL ECHOCARDIOGRAM (TEE);  Surgeon: Sande Rives, MD;  Location: [facility] ENDOSCOPY;  Service: Cardiovascular;  Laterality: N/A;   TEE WITHOUT CARDIOVERSION N/A 02/15/2022   Procedure: TRANSESOPHAGEAL ECHOCARDIOGRAM (TEE);  Surgeon: Tonny Bollman, MD;  Location: [facility] INVASIVE CV LAB;  Service: Cardiovascular;  Laterality: N/A;   TEE WITHOUT CARDIOVERSION N/A 05/14/2022   Procedure: TRANSESOPHAGEAL ECHOCARDIOGRAM (TEE);  Surgeon: Sande Rives, MD;  Location: [facility] ENDOSCOPY;  Service: Cardiovascular;  Laterality: N/A;   TEE WITHOUT CARDIOVERSION N/A 04/03/2023   Procedure: TRANSESOPHAGEAL ECHOCARDIOGRAM;  Surgeon: Tonny Bollman, MD;  Location: [facility] INVASIVE CV LAB;  Service: Cardiovascular;  Laterality: N/A;   TEE WITHOUT CARDIOVERSION N/A 09/05/2023   Procedure: TRANSESOPHAGEAL ECHOCARDIOGRAM;  Surgeon: Debbe Odea, MD;  Location: [facility] ORS;  Service: Cardiovascular;  Laterality: N/A;   TONSILLECTOMY  1946   TRANSCATHETER MITRAL EDGE TO EDGE REPAIR N/A 02/15/2022   Procedure: MITRAL VALVE REPAIR;  Surgeon: Tonny Bollman, MD;  Location: [facility] INVASIVE CV LAB;  Service: Cardiovascular;  Laterality: N/A;   TRANSCATHETER MITRAL EDGE TO EDGE REPAIR N/A 04/03/2023   Procedure:  MITRAL VALVE REPAIR;  Surgeon: Tonny Bollman, MD;  Location: [facility] INVASIVE CV LAB;  Service: Cardiovascular;  Laterality: N/A;    Allergies  Allergen Reactions   Sulfa Antibiotics Anaphylaxis   Sulfonamide Derivatives Anaphylaxis   Topamax [Topiramate] Other (See Comments)    Metabolic acidosis    Clarithromycin Other (See Comments)    pericarditis  Other reaction(s): Not available   Hydrocodone Other (See Comments)    "hyper and climbing the walls"   Motrin [Ibuprofen] Other (See Comments)    headaches   Misc. Sulfonamide Containing Compounds     Other reaction(s): Not available   Symbicort [Budesonide-Formoterol Fumarate] Other (See Comments)    02/07/15 tremor   Tetracycline Hcl Other (See Comments)   Percocet [Oxycodone-Acetaminophen] Itching and Rash   Tape Itching and Rash    Use paper tape only   Tetracyclines & Related Other (See Comments)    "immediate yeast infection"    Outpatient Encounter Medications as of 12/18/2023  Medication Sig   acetaminophen (TYLENOL) 500 MG tablet Take 1,000 mg by mouth 3 (three) times daily.   albuterol (VENTOLIN HFA) 108 (90 Base) MCG/ACT inhaler Inhale 2 puffs into the lungs every 6 (six) hours as needed for wheezing or shortness of breath.   aluminum-magnesium hydroxide 200-200 MG/5ML suspension Take 10 mLs by mouth every 4 (four) hours as needed for indigestion.   amiodarone (PACERONE) 100 MG tablet Take 100 mg by mouth daily.   apixaban (ELIQUIS) 2.5 MG TABS tablet Take 1 tablet (2.5 mg total) by mouth 2 (two) times daily.   bisacodyl (DULCOLAX) 10 MG suppository Place 10 mg rectally as needed for moderate constipation.   calcium carbonate (TUMS - DOSED IN MG ELEMENTAL CALCIUM) 500 MG chewable tablet Chew 2 tablets by mouth daily as needed for indigestion or heartburn.   Calcium Carbonate Antacid 600 MG chewable tablet Chew 600 mg by mouth daily.   Carbidopa-Levodopa ER (SINEMET CR) 25-100 MG tablet controlled release TAKE TWO TABLET BY  MOUTH AT NINE IN THE MORNING, ONE TAB AT ONE IN THE EVENING AND ONE TAB AT FIVE IN THE EVENING   cetirizine (ZYRTEC) 10 MG tablet Take 10 mg by mouth daily.   Cholecalciferol (VITAMIN D3) 25 MCG (1000 UT) CAPS Take 1 capsule (1,000 Units total) by mouth daily.   clonazePAM (KLONOPIN) 0.5 MG tablet TAKE ONE HALF (1/2) A TABLET BY MOUTH AT BEDTIME   docusate sodium (COLACE) 100 MG capsule Take 100 mg by mouth 2 (two) times daily.   EPINEPHrine 0.3 mg/0.3 mL IJ SOAJ injection Inject 0.3 mg into the muscle as needed for anaphylaxis.   fluticasone (FLONASE) 50 MCG/ACT nasal spray Place into both nostrils.   Fluticasone-Umeclidin-Vilant (TRELEGY ELLIPTA) 100-62.5-25 MCG/ACT AEPB Inhale 1 puff into the lungs daily.   furosemide (LASIX) 40 MG tablet TAKE ONE TABLET BY MOUTH ONCE  DAILY *NEW DOSE*   gabapentin ([name]) 300 MG capsule Take 600 mg by mouth 2 (two) times daily.   magnesium hydroxide (MILK OF MAGNESIA) 400 MG/5ML suspension Take 30 mLs by mouth daily as needed for mild constipation.   pantoprazole (PROTONIX) 20 MG tablet Take 1 tablet (20 mg total) by mouth daily.   polyethylene glycol (MIRALAX / GLYCOLAX) 17 g packet Take 17 g by mouth daily as needed.   [DISCONTINUED] gabapentin ([name]) 300 MG capsule TAKE TWO CAPSULES BY MOUTH THREE TIMES DAILY (Patient taking differently: Take Two capsules by mouth twice daily.)   No facility-administered encounter medications on file as of 12/18/2023.    Review of Systems  Immunization History  Administered Date(s) Administered   Fluad Quad(high Dose 65+) 08/11/2019, 08/03/2020, 08/08/2021, 09/06/2022   Influenza Split 09/11/2011   Influenza Whole 09/09/2007, 08/08/2008, 08/15/2010, 08/26/2012   Influenza,inj,Quad PF,6+ Mos 08/18/2013, 08/20/2014, 08/25/2018   Influenza-Unspecified 09/27/2015, 08/26/2017, 09/18/2023   Moderna Covid-19 Fall Seasonal Vaccine 52yrs & older 10/25/2022   Moderna Covid-19 Vaccine Bivalent Booster 65yrs & up  04/24/2022, 03/05/2023   Moderna Sars-Covid-2 Vaccination 01/01/2020, 01/20/2020, 02/16/2020, 07/25/2020, 11/22/2020   PPD Test 08/24/2022   Pneumococcal Conjugate-13 02/12/2014   Pneumococcal Polysaccharide-23 11/26/2006, 10/21/2012, 11/22/2020, 11/21/2021   Rsv, Bivalent, Protein Subunit Rsvpref,pf Verdis Frederickson) 10/25/2022   Td 01/01/2011, 06/30/2021   Zoster Recombinant(Shingrix) 02/22/2018, 07/08/2019   Zoster, Live 05/26/2007   Pertinent  Health Maintenance Due  Topic Date Due   INFLUENZA VACCINE  Completed   DEXA SCAN  Completed   Colonoscopy  Discontinued      11/25/2022    8:30 PM 11/26/2022    9:30 AM 12/07/2022   11:43 AM 01/29/2023   10:32 AM 08/01/2023   10:29 AM  Fall Risk  Falls in the past year?   1 1 0  Was there an injury with Fall?   1 1 0  Fall Risk Category Calculator   3 3 0  Fall Risk Category (Retired)   High    (RETIRED) Patient Fall Risk Level High fall risk High fall risk     Fall risk Follow up     Falls evaluation completed   Functional Status Survey:    Vitals:   12/18/23 1625 12/18/23 1635  BP: (!) 170/94 (!) 166/93  Pulse: (!) 56   Resp: 20   Temp: 97.7 F (36.5 C)   SpO2: 93%   Weight: 120 lb 6.4 oz (54.6 kg)   Height: 5\' 2"  (1.575 m)    Body mass index is 22.02 kg/m. Physical Exam Physical Exam GENITOURINARY: Groin tenderness upon palpation, no masses or abscesses palpated, no evidence of hernia, vulvar abscess, or hair follicle infection observed Labs reviewed: Recent Labs    09/04/23 1057 09/05/23 0521 09/06/23 0345 09/07/23 0313 09/08/23 0417 09/16/23 0000 11/14/23 0000  NA  --    < > 135 135 139 140 143  K  --    < > 4.1 3.6 4.1 3.7 4.4  CL  --    < > 100 100 102 101 101  CO2  --    < > 23 26 26  31* 36*  GLUCOSE  --    < > 149* 111* 84  --   --   BUN  --    < > 27* 27* 23 17 18   CREATININE  --    < > 1.07* 0.82 0.78 0.9 0.8  CALCIUM  --    < > 8.4* 7.8* 8.3* 9.1 9.9  MG  2.1  --   --   --   --   --   --    < > = values in  this interval not displayed.   Recent Labs    04/01/23 0816 08/05/23 1114 09/05/23 0521  AST 20 13 21   ALT 5 2 14   ALKPHOS 67 98 80  BILITOT 2.0* 1.0 1.5*  PROT 6.9 7.6 6.7  ALBUMIN 3.8 3.7 3.4*   Recent Labs    09/06/23 0345 09/07/23 0313 09/08/23 0417 09/26/23 0000  WBC 5.2 6.2 6.0 3.8  NEUTROABS 4.5 4.8 4.3  --   HGB 11.9* 11.8* 12.7 12.4  HCT 36.4 36.8 38.9 39  MCV 101.1* 100.0 99.0  --   PLT 145* 140* 162 173   Lab Results  Component Value Date   TSH 1.145 09/07/2023   Lab Results  Component Value Date   HGBA1C 5.3 01/14/2018   Lab Results  Component Value Date   CHOL 171 08/08/2021   HDL 53.30 08/08/2021   LDLCALC 97 08/08/2021   LDLDIRECT 135.0 08/16/2009   TRIG 100.0 08/08/2021   CHOLHDL 3 08/08/2021    Significant Diagnostic Results in last 30 days:  No results found.  Assessment/Plan Groin Pain Intermittent groin pain, primarily in the morning, with tenderness upon palpation. Differential diagnosis includes hernia, scar tissue from previous catheterizations, or minor inflammation. No signs of incarcerated hernia or abscess. Discussed elective surgery if hernia is confirmed; patient prefers to avoid surgery unless necessary. Explained risks of incarcerated hernia, including pain, bloating, bowel movement issues, and vomiting. Reassured by absence of these symptoms currently. - Recommend warm compresses for morning pain - Patient declines ultrasound as she does not want any interventions. Conservative interventions only. - Reassess in one month.  Family/ staff Communication: nursing  Labs/tests ordered:  none

## 2023-12-19 DIAGNOSIS — I5033 Acute on chronic diastolic (congestive) heart failure: Secondary | ICD-10-CM | POA: Diagnosis not present

## 2023-12-19 DIAGNOSIS — F419 Anxiety disorder, unspecified: Secondary | ICD-10-CM | POA: Diagnosis not present

## 2023-12-19 DIAGNOSIS — I4891 Unspecified atrial fibrillation: Secondary | ICD-10-CM | POA: Diagnosis not present

## 2023-12-19 DIAGNOSIS — R2681 Unsteadiness on feet: Secondary | ICD-10-CM | POA: Diagnosis not present

## 2023-12-19 DIAGNOSIS — Z741 Need for assistance with personal care: Secondary | ICD-10-CM | POA: Diagnosis not present

## 2023-12-19 DIAGNOSIS — J449 Chronic obstructive pulmonary disease, unspecified: Secondary | ICD-10-CM | POA: Diagnosis not present

## 2023-12-19 DIAGNOSIS — M6281 Muscle weakness (generalized): Secondary | ICD-10-CM | POA: Diagnosis not present

## 2023-12-19 DIAGNOSIS — G20A1 Parkinson's disease without dyskinesia, without mention of fluctuations: Secondary | ICD-10-CM | POA: Diagnosis not present

## 2023-12-19 DIAGNOSIS — I110 Hypertensive heart disease with heart failure: Secondary | ICD-10-CM | POA: Diagnosis not present

## 2023-12-19 MED ORDER — GABAPENTIN 300 MG PO CAPS: ORAL_CAPSULE | ORAL | 1 refills | Status: DC

## 2023-12-20 DIAGNOSIS — M6281 Muscle weakness (generalized): Secondary | ICD-10-CM | POA: Diagnosis not present

## 2023-12-20 DIAGNOSIS — I110 Hypertensive heart disease with heart failure: Secondary | ICD-10-CM | POA: Diagnosis not present

## 2023-12-20 DIAGNOSIS — R2681 Unsteadiness on feet: Secondary | ICD-10-CM | POA: Diagnosis not present

## 2023-12-20 DIAGNOSIS — I5033 Acute on chronic diastolic (congestive) heart failure: Secondary | ICD-10-CM | POA: Diagnosis not present

## 2023-12-20 DIAGNOSIS — G20A1 Parkinson's disease without dyskinesia, without mention of fluctuations: Secondary | ICD-10-CM | POA: Diagnosis not present

## 2023-12-20 DIAGNOSIS — I4891 Unspecified atrial fibrillation: Secondary | ICD-10-CM | POA: Diagnosis not present

## 2023-12-20 DIAGNOSIS — F419 Anxiety disorder, unspecified: Secondary | ICD-10-CM | POA: Diagnosis not present

## 2023-12-20 DIAGNOSIS — Z741 Need for assistance with personal care: Secondary | ICD-10-CM | POA: Diagnosis not present

## 2023-12-20 DIAGNOSIS — J449 Chronic obstructive pulmonary disease, unspecified: Secondary | ICD-10-CM | POA: Diagnosis not present

## 2023-12-23 DIAGNOSIS — I4891 Unspecified atrial fibrillation: Secondary | ICD-10-CM | POA: Diagnosis not present

## 2023-12-23 DIAGNOSIS — Z741 Need for assistance with personal care: Secondary | ICD-10-CM | POA: Diagnosis not present

## 2023-12-23 DIAGNOSIS — G20A1 Parkinson's disease without dyskinesia, without mention of fluctuations: Secondary | ICD-10-CM | POA: Diagnosis not present

## 2023-12-23 DIAGNOSIS — I110 Hypertensive heart disease with heart failure: Secondary | ICD-10-CM | POA: Diagnosis not present

## 2023-12-23 DIAGNOSIS — J449 Chronic obstructive pulmonary disease, unspecified: Secondary | ICD-10-CM | POA: Diagnosis not present

## 2023-12-23 DIAGNOSIS — R2681 Unsteadiness on feet: Secondary | ICD-10-CM | POA: Diagnosis not present

## 2023-12-23 DIAGNOSIS — I5033 Acute on chronic diastolic (congestive) heart failure: Secondary | ICD-10-CM | POA: Diagnosis not present

## 2023-12-23 DIAGNOSIS — F419 Anxiety disorder, unspecified: Secondary | ICD-10-CM | POA: Diagnosis not present

## 2023-12-23 DIAGNOSIS — M6281 Muscle weakness (generalized): Secondary | ICD-10-CM | POA: Diagnosis not present

## 2023-12-27 DIAGNOSIS — G20A1 Parkinson's disease without dyskinesia, without mention of fluctuations: Secondary | ICD-10-CM | POA: Diagnosis not present

## 2023-12-27 DIAGNOSIS — J449 Chronic obstructive pulmonary disease, unspecified: Secondary | ICD-10-CM | POA: Diagnosis not present

## 2023-12-27 DIAGNOSIS — Z741 Need for assistance with personal care: Secondary | ICD-10-CM | POA: Diagnosis not present

## 2023-12-27 DIAGNOSIS — I5033 Acute on chronic diastolic (congestive) heart failure: Secondary | ICD-10-CM | POA: Diagnosis not present

## 2023-12-27 DIAGNOSIS — F419 Anxiety disorder, unspecified: Secondary | ICD-10-CM | POA: Diagnosis not present

## 2023-12-27 DIAGNOSIS — I110 Hypertensive heart disease with heart failure: Secondary | ICD-10-CM | POA: Diagnosis not present

## 2023-12-27 DIAGNOSIS — R2681 Unsteadiness on feet: Secondary | ICD-10-CM | POA: Diagnosis not present

## 2023-12-27 DIAGNOSIS — M6281 Muscle weakness (generalized): Secondary | ICD-10-CM | POA: Diagnosis not present

## 2023-12-27 DIAGNOSIS — I4891 Unspecified atrial fibrillation: Secondary | ICD-10-CM | POA: Diagnosis not present

## 2023-12-30 ENCOUNTER — Non-Acute Institutional Stay (SKILLED_NURSING_FACILITY): Payer: Self-pay | Admitting: Student

## 2023-12-30 ENCOUNTER — Encounter: Payer: Self-pay | Admitting: Student

## 2023-12-30 DIAGNOSIS — I5033 Acute on chronic diastolic (congestive) heart failure: Secondary | ICD-10-CM

## 2023-12-30 NOTE — Progress Notes (Signed)
Location:  Other Nursing Home Room Number: [facility] [address] of Service:  SNF [phone]) Provider:  Ander Gaster, Benetta Spar, MD  Patient Care Team: Earnestine Mealing, MD as PCP - General (Family Medicine) Iran Ouch, MD as PCP - Cardiology (Cardiology) Sidney Ace, MD as Referring Physician (Allergy) Jerene Bears, MD as Consulting Physician (Gynecology) Tat, Octaviano Batty, DO as Consulting Physician (Neurology) Serena Croissant, MD as Consulting Physician (Hematology and Oncology) Tat, Octaviano Batty, DO as Consulting Physician (Neurology) Kathyrn Sheriff, [facility] (Inactive) as Pharmacist (Pharmacist)  Extended Emergency Contact Information Primary Emergency Contact: Elson Areas [place], [place] [address] [place] of [place] Home Phone: [phone] Mobile Phone: [phone] Relation: Brother Secondary Emergency Contact: [facility] Phone: [phone] Mobile Phone: [phone] Relation: Niece Preferred language: English Interpreter needed? No  Code Status:  DNR Goals of care: Advanced Directive information    12/18/2023    4:34 PM  Advanced Directives  Does Patient Have a Medical Advance Directive? Yes  Type of Estate agent of [place];Out of facility DNR (pink MOST or yellow form);Living will  Does patient want to make changes to medical advance directive? No - Patient declined  Copy of Healthcare Power of Attorney in Chart? Yes - validated most recent copy scanned in chart (See row information)     Chief Complaint  Patient presents with   Chest Pain         HPI:  Pt is a 83 y.o. female seen today for an acute visit for follow up of Chest Pain: History of Present Illness The patient, with heart disease, presents with chest pain.  She experienced chest pain over the weekend at approximately 2:30 AM. The pain was different from her usual episodes, extending from her neck to her abdomen. She was asleep when the pain  occurred and noted her blood pressure was significantly elevated at 194/84, though it later decreased to 122. She has a history of heart disease and has previously taken nitroglycerin many years ago.  She experienced an asthma attack last night at 3:00 AM, which improved with the use of albuterol. She mentions that she has only used albuterol a couple of times before.  She has a long-standing history of constipation, noting she has never had regular bowel movements, even as a child. She uses a suppository and is considering other options like MiraLAX or Senokot to maintain regularity.    Results DIAGNOSTIC EKG: Abnormal electrical signaling (11/29/2023)    Past Medical History:  Diagnosis Date   Aneurysm of aorta (HCC)    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   Anginal pain (HCC)    a. 10/2021 Cath: Nl cors.   Arthritis    Asthma    Breast cancer (HCC) 2002   infiltrative ductal carcinoma    Cataract 2019   Chronic heart failure with preserved ejection fraction (HFpEF) (HCC)    COPD (chronic obstructive pulmonary disease) (HCC) 07/2007   Coronary artery disease    Ductal carcinoma of breast, estrogen receptor positive, stage 1 (HCC) 09/16/2012   Endometriosis    GERD (gastroesophageal reflux disease)    Hx of mitral valve repair    Hypercalcemia 09/14/2013   Hypertension    Lesion of breast 1992   right, benign   Lung disease    secondary to [name] infection   Mastalgia 05/1994   Mitral regurgitation    a. 10/2021 RHC: PCWP V wave of 34; b.  01/2022 TEE: Severe flail/prolapse of large area of A2 scallop with severe posteriorly directed MR; c. 01/2022 TEER w /Mitraclip XTW x 2; d. 07/2022 Echo: EF 60-65%. No rwma, mild LVH, GrIII DD. Sev dil LA. Mod elev PASP. Mod-Sev MR. Mod MS.   Moderate mitral regurgitation by prior echocardiogram    Osteoporosis, post-menopausal 03/14/2012   Ovarian tumor of borderline malignancy, right 2004   Palpitations    a. 02/2022 Zio: Predominantly  sinus rhythm at 67 (52-171).  2 runs of NSVT -fastest 9 beats at 167.  35 SVT runs-longest 19 beats, fastest 171x6 beats.   Pancreatitis    secondary to Cholelithiasis   Parkinson disease (HCC) 02/10/2015   Post-thoracotomy pain syndrome    Pseudomonas pneumonia (HCC) 03/2008   Status post implantation of mitral valve leaflet clip 02/15/2022   s/p XTW MitraClip x2 by Dr. Excell Seltzer in the A2/P2: b. 04/03/23 s/p third mitraclip implantation.   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   APPENDECTOMY  2004   BREAST BIOPSY  08/2004   right breast-benign   BREAST LUMPECTOMY  1992   benign   BUBBLE STUDY  12/27/2021   Procedure: BUBBLE STUDY;  Surgeon: Sande Rives, MD;  Location: [facility] ENDOSCOPY;  Service: Cardiovascular;;   CARDIAC CATHETERIZATION  2007   essentially negation for significant CAD   CARDIOVERSION N/A 09/05/2023   Procedure: CARDIOVERSION;  Surgeon: Debbe Odea, MD;  Location: [facility] ORS;  Service: Cardiovascular;  Laterality: N/A;   CATARACT EXTRACTION W/PHACO Left 07/24/2023   Procedure: CATARACT EXTRACTION PHACO AND INTRAOCULAR LENS PLACEMENT (IOC) LEFT 5.34 0037.3;  Surgeon: Lockie Mola, MD;  Location: [facility] SURGERY CNTR;  Service: Ophthalmology;  Laterality: Left;   CATARACT EXTRACTION W/PHACO Right 08/07/2023   Procedure: CATARACT EXTRACTION PHACO AND INTRAOCULAR LENS PLACEMENT (IOC) RIGHT 7.83 00:47.9;  Surgeon: Lockie Mola, MD;  Location: [facility] SURGERY CNTR;  Service: Ophthalmology;  Laterality: Right;   CHOLECYSTECTOMY  2001   COLONOSCOPY  2014   Dr Marina Goodell , due 2019   COLONOSCOPY  09/2018   2 TA removed, int hem, no f/u needed (Dr Marina Goodell)    ESOPHAGOGASTRODUODENOSCOPY  09/2018   GERD with  esophagitis and stricture dilated, antral gastritis negative for H pylori Marina Goodell)   HIP ARTHROPLASTY Left 08/21/2022   Procedure: ARTHROPLASTY BIPOLAR HIP (HEMIARTHROPLASTY);  Surgeon: Juanell Fairly, MD;  Location: [facility] ORS;  Service: Orthopedics;  Laterality: Left;   LUNG BIOPSY  2002   [name], Dr Edwyna Shell   MASTECTOMY MODIFIED RADICAL Left 2002   oral chemotheraphy (tamoxifen then Armidex) no radiation, Dr.[name]   RIGHT/LEFT HEART CATH AND CORONARY ANGIOGRAPHY Bilateral 11/14/2021   Procedure: RIGHT/LEFT HEART CATH AND CORONARY ANGIOGRAPHY;  Surgeon: Iran Ouch, MD;  Location: [facility] INVASIVE CV LAB;  Service: Cardiovascular;  Laterality: Bilateral;   TEE WITHOUT CARDIOVERSION N/A 12/29/2018   Procedure: TRANSESOPHAGEAL ECHOCARDIOGRAM (TEE);  Surgeon: Iran Ouch, MD;  Location: [facility] ORS;  Service: Cardiovascular;  Laterality: N/A;   TEE WITHOUT CARDIOVERSION N/A 12/27/2021   Procedure: TRANSESOPHAGEAL ECHOCARDIOGRAM (TEE);  Surgeon: Sande Rives, MD;  Location: [facility] ENDOSCOPY;  Service: Cardiovascular;  Laterality: N/A;   TEE WITHOUT CARDIOVERSION N/A 02/15/2022   Procedure: TRANSESOPHAGEAL ECHOCARDIOGRAM (TEE);  Surgeon: Tonny Bollman, MD;  Location:  [facility] INVASIVE CV LAB;  Service: Cardiovascular;  Laterality: N/A;   TEE WITHOUT CARDIOVERSION N/A 05/14/2022   Procedure: TRANSESOPHAGEAL ECHOCARDIOGRAM (TEE);  Surgeon: Sande Rives, MD;  Location: [facility] ENDOSCOPY;  Service: Cardiovascular;  Laterality: N/A;   TEE WITHOUT CARDIOVERSION N/A 04/03/2023   Procedure: TRANSESOPHAGEAL ECHOCARDIOGRAM;  Surgeon: Tonny Bollman, MD;  Location: [facility] INVASIVE CV LAB;  Service: Cardiovascular;  Laterality: N/A;   TEE WITHOUT CARDIOVERSION N/A 09/05/2023   Procedure: TRANSESOPHAGEAL ECHOCARDIOGRAM;  Surgeon: Debbe Odea, MD;  Location: [facility] ORS;  Service: Cardiovascular;  Laterality: N/A;   TONSILLECTOMY  1946   TRANSCATHETER MITRAL EDGE TO EDGE REPAIR N/A 02/15/2022   Procedure:  MITRAL VALVE REPAIR;  Surgeon: Tonny Bollman, MD;  Location: [facility] INVASIVE CV LAB;  Service: Cardiovascular;  Laterality: N/A;   TRANSCATHETER MITRAL EDGE TO EDGE REPAIR N/A 04/03/2023   Procedure: MITRAL VALVE REPAIR;  Surgeon: Tonny Bollman, MD;  Location: [facility] INVASIVE CV LAB;  Service: Cardiovascular;  Laterality: N/A;    Allergies  Allergen Reactions   Sulfa Antibiotics Anaphylaxis   Sulfonamide Derivatives Anaphylaxis   Topamax [Topiramate] Other (See Comments)    Metabolic acidosis    Clarithromycin Other (See Comments)    pericarditis  Other reaction(s): Not available   Hydrocodone Other (See Comments)    "hyper and climbing the walls"   Motrin [Ibuprofen] Other (See Comments)    headaches   Misc. Sulfonamide Containing Compounds     Other reaction(s): Not available   Symbicort [Budesonide-Formoterol Fumarate] Other (See Comments)    02/07/15 tremor   Tetracycline Hcl Other (See Comments)   Percocet [Oxycodone-Acetaminophen] Itching and Rash   Tape Itching and Rash    Use paper tape only   Tetracyclines & Related Other (See Comments)    "immediate yeast infection"    Outpatient Encounter Medications as of 12/30/2023  Medication Sig   acetaminophen (TYLENOL) 500 MG tablet Take 1,000 mg by mouth 3 (three) times daily.   albuterol (VENTOLIN HFA) 108 (90 Base) MCG/ACT inhaler Inhale 2 puffs into the lungs every 6 (six) hours as needed for wheezing or shortness of breath.   aluminum-magnesium hydroxide 200-200 MG/5ML suspension Take 10 mLs by mouth every 4 (four) hours as needed for indigestion.   amiodarone (PACERONE) 100 MG tablet Take 100 mg by mouth daily.   apixaban (ELIQUIS) 2.5 MG TABS tablet Take 1 tablet (2.5 mg total) by mouth 2 (two) times daily.   bisacodyl (DULCOLAX) 10 MG suppository Place 10 mg rectally as needed for moderate constipation.   calcium carbonate (TUMS - DOSED IN MG ELEMENTAL CALCIUM) 500 MG chewable tablet Chew 2 tablets by mouth daily as needed for  indigestion or heartburn.   Calcium Carbonate Antacid 600 MG chewable tablet Chew 600 mg by mouth daily.   Carbidopa-Levodopa ER (SINEMET CR) 25-100 MG tablet controlled release TAKE TWO TABLET BY MOUTH AT NINE IN THE MORNING, ONE TAB AT ONE IN THE EVENING AND ONE TAB AT FIVE IN THE EVENING   cetirizine (ZYRTEC) 10 MG tablet Take 10 mg by mouth daily.   Cholecalciferol (VITAMIN D3) 25 MCG (1000 UT) CAPS Take 1 capsule (1,000 Units total) by mouth daily.   clonazePAM (KLONOPIN) 0.5 MG tablet TAKE ONE HALF (1/2) A TABLET BY MOUTH AT BEDTIME   docusate sodium (COLACE) 100 MG capsule Take 100 mg by mouth 2 (two) times daily.   EPINEPHrine 0.3 mg/0.3 mL IJ SOAJ injection Inject 0.3 mg into the muscle as needed for anaphylaxis.   fluticasone (FLONASE) 50 MCG/ACT  nasal spray Place into both nostrils.   Fluticasone-Umeclidin-Vilant (TRELEGY ELLIPTA) 100-62.5-25 MCG/ACT AEPB Inhale 1 puff into the lungs daily.   furosemide (LASIX) 40 MG tablet TAKE ONE TABLET BY MOUTH ONCE DAILY *NEW DOSE*   gabapentin ([name]) 300 MG capsule Take Two capsules by mouth twice daily.   magnesium hydroxide (MILK OF MAGNESIA) 400 MG/5ML suspension Take 30 mLs by mouth daily as needed for mild constipation.   pantoprazole (PROTONIX) 20 MG tablet Take 1 tablet (20 mg total) by mouth daily.   polyethylene glycol (MIRALAX / GLYCOLAX) 17 g packet Take 17 g by mouth daily as needed.   No facility-administered encounter medications on file as of 12/30/2023.    Review of Systems  Immunization History  Administered Date(s) Administered   Fluad Quad(high Dose 65+) 08/11/2019, 08/03/2020, 08/08/2021, 09/06/2022   Influenza Split 09/11/2011   Influenza Whole 09/09/2007, 08/08/2008, 08/15/2010, 08/26/2012   Influenza,inj,Quad PF,6+ Mos 08/18/2013, 08/20/2014, 08/25/2018   Influenza-Unspecified 09/27/2015, 08/26/2017, 09/18/2023   Moderna Covid-19 Fall Seasonal Vaccine 18yrs & older 10/25/2022   Moderna Covid-19 Vaccine Bivalent  Booster 65yrs & up 04/24/2022, 03/05/2023   Moderna Sars-Covid-2 Vaccination 01/01/2020, 01/20/2020, 02/16/2020, 07/25/2020, 11/22/2020   PPD Test 08/24/2022   Pneumococcal Conjugate-13 02/12/2014   Pneumococcal Polysaccharide-23 11/26/2006, 10/21/2012, 11/22/2020, 11/21/2021   Rsv, Bivalent, Protein Subunit Rsvpref,pf Verdis Frederickson) 10/25/2022   Td 01/01/2011, 06/30/2021   Zoster Recombinant(Shingrix) 02/22/2018, 07/08/2019   Zoster, Live 05/26/2007   Pertinent  Health Maintenance Due  Topic Date Due   INFLUENZA VACCINE  Completed   DEXA SCAN  Completed   Colonoscopy  Discontinued      11/25/2022    8:30 PM 11/26/2022    9:30 AM 12/07/2022   11:43 AM 01/29/2023   10:32 AM 08/01/2023   10:29 AM  Fall Risk  Falls in the past year?   1 1 0  Was there an injury with Fall?   1 1 0  Fall Risk Category Calculator   3 3 0  Fall Risk Category (Retired)   High    (RETIRED) Patient Fall Risk Level High fall risk High fall risk     Fall risk Follow up     Falls evaluation completed   Functional Status Survey:    Vitals:   12/30/23 0940  BP: 122/78  Pulse: (!) 50  Resp: 20  Temp: 97.8 F (36.6 C)  SpO2: 95%   There is no height or weight on file to calculate BMI. Physical Exam Constitutional:      Appearance: Normal appearance.  Cardiovascular:     Rate and Rhythm: Normal rate and regular rhythm.     Pulses: Normal pulses.     Heart sounds: Normal heart sounds.  Pulmonary:     Effort: Pulmonary effort is normal.  Abdominal:     General: Abdomen is flat. Bowel sounds are normal.     Palpations: Abdomen is soft.  Musculoskeletal:        General: No swelling or tenderness.  Skin:    General: Skin is warm and dry.     Capillary Refill: Capillary refill takes 2 to 3 seconds.  Neurological:     Mental Status: She is alert and oriented to person, place, and time.     Gait: Gait normal.     Comments: Resting tremor  Psychiatric:        Mood and Affect: Mood normal.   Physical  Exam VITALS: BP- 194/84 CHEST: No wheezing heard upon auscultation. CARDIOVASCULAR: Heart rate normal.  Labs  reviewed: Recent Labs    09/04/23 1057 09/05/23 0521 09/06/23 0345 09/07/23 0313 09/08/23 0417 09/16/23 0000 11/14/23 0000  NA  --    < > 135 135 139 140 143  K  --    < > 4.1 3.6 4.1 3.7 4.4  CL  --    < > 100 100 102 101 101  CO2  --    < > 23 26 26  31* 36*  GLUCOSE  --    < > 149* 111* 84  --   --   BUN  --    < > 27* 27* 23 17 18   CREATININE  --    < > 1.07* 0.82 0.78 0.9 0.8  CALCIUM  --    < > 8.4* 7.8* 8.3* 9.1 9.9  MG 2.1  --   --   --   --   --   --    < > = values in this interval not displayed.   Recent Labs    04/01/23 0816 08/05/23 1114 09/05/23 0521  AST 20 13 21   ALT 5 2 14   ALKPHOS 67 98 80  BILITOT 2.0* 1.0 1.5*  PROT 6.9 7.6 6.7  ALBUMIN 3.8 3.7 3.4*   Recent Labs    09/06/23 0345 09/07/23 0313 09/08/23 0417 09/26/23 0000  WBC 5.2 6.2 6.0 3.8  NEUTROABS 4.5 4.8 4.3  --   HGB 11.9* 11.8* 12.7 12.4  HCT 36.4 36.8 38.9 39  MCV 101.1* 100.0 99.0  --   PLT 145* 140* 162 173   Lab Results  Component Value Date   TSH 1.145 09/07/2023   Lab Results  Component Value Date   HGBA1C 5.3 01/14/2018   Lab Results  Component Value Date   CHOL 171 08/08/2021   HDL 53.30 08/08/2021   LDLCALC 97 08/08/2021   LDLDIRECT 135.0 08/16/2009   TRIG 100.0 08/08/2021   CHOLHDL 3 08/08/2021    Significant Diagnostic Results in last 30 days:  No results found.  Assessment/Plan Chest Pain Intermittent chest pain radiating from the neck to the abdomen, occurring at 2:30 AM. Blood pressure was elevated at 194/84 during the episode but normalized to 122 later. Differential includes angina and potential myocardial infarction. Discussed nitroglycerin for management. Patient prefers comfort measures over aggressive interventions like catheterization. - Order EKG - Ensure availability of nitroglycerin - Provide as-needed acid reflux  medication  Asthma Asthma attack at 3:00 AM, improved with albuterol. No wheezing detected, heart rate normal. - Continue albuterol as needed  Constipation Chronic constipation with irregular bowel movements. MiraLAX preferred over Colace for effectiveness. MiraLAX to be taken on Monday, Wednesday, and Friday. - Schedule MiraLAX on Monday, Wednesday, and Friday  Goals of Care Prefers comfort measures over aggressive treatments in terminal conditions. Wishes to make decisions at the time of the event if possible. If unresponsive, prefers not to be hospitalized unless able to verbalize otherwise. - Document preference for comfort measures and avoidance of aggressive interventions - Consider 'Do Not Hospitalize' order with option for patient to override if able to verbalize.   Family/ staff Communication: nursing  Labs/tests ordered:  none

## 2024-01-01 DIAGNOSIS — I7091 Generalized atherosclerosis: Secondary | ICD-10-CM | POA: Diagnosis not present

## 2024-01-17 ENCOUNTER — Other Ambulatory Visit: Payer: Self-pay | Admitting: Adult Health

## 2024-01-17 DIAGNOSIS — G20A1 Parkinson's disease without dyskinesia, without mention of fluctuations: Secondary | ICD-10-CM

## 2024-01-17 MED ORDER — CLONAZEPAM 0.5 MG PO TABS: ORAL_TABLET | ORAL | 0 refills | Status: DC

## 2024-01-20 ENCOUNTER — Encounter: Payer: Self-pay | Admitting: Adult Health

## 2024-01-20 ENCOUNTER — Non-Acute Institutional Stay (SKILLED_NURSING_FACILITY): Payer: Self-pay | Admitting: Adult Health

## 2024-01-20 DIAGNOSIS — J3089 Other allergic rhinitis: Secondary | ICD-10-CM

## 2024-01-20 DIAGNOSIS — J449 Chronic obstructive pulmonary disease, unspecified: Secondary | ICD-10-CM

## 2024-01-20 MED ORDER — FEXOFENADINE HCL 30 MG/5ML PO SUSP: 30.0000 mg | Freq: Two times a day (BID) | ORAL | Status: DC

## 2024-01-20 NOTE — Progress Notes (Signed)
 Location:  Other ([facility] Room Number: 516 Place of Service:  SNF ([phone]) Provider:  Kenard Gower, DNP, FNP-BC  Patient Care Team: Earnestine Mealing, MD as PCP - General (Family Medicine) Iran Ouch, MD as PCP - Cardiology (Cardiology) Sidney Ace, MD as Referring Physician (Allergy) Jerene Bears, MD as Consulting Physician (Gynecology) Tat, Octaviano Batty, DO as Consulting Physician (Neurology) Serena Croissant, MD as Consulting Physician (Hematology and Oncology) Tat, Octaviano Batty, DO as Consulting Physician (Neurology) Kathyrn Sheriff, [facility] (Inactive) as Pharmacist (Pharmacist)  Extended Emergency Contact Information Primary Emergency Contact: Elson Areas [place], [place] [address] [address] [place] of [place] Home Phone: [phone] Mobile Phone: [phone] Relation: Brother Secondary Emergency Contact: [facility] Phone: [phone] Mobile Phone: [phone] Relation: Niece Preferred language: English Interpreter needed? No  Code Status:   DNR  Goals of care: Advanced Directive information    12/18/2023    4:34 PM  Advanced Directives  Does Patient Have a Medical Advance Directive? Yes  Type of Estate agent of [place];Out of facility DNR (pink MOST or yellow form);Living will  Does patient want to make changes to medical advance directive? No - Patient declined  Copy of Healthcare Power of Attorney in Chart? Yes - validated most recent copy scanned in chart (See row information)     Chief Complaint  Patient presents with   Acute Visit    Environmental allergies    HPI:  Pt is a 83 y.o. female seen today for an acute visit regarding environmental allergies. She is a resident of [facility]. She is requesting for Allegra at bedtime. She stated that she used to go to an allergy clinic and had history of "injections" for allergy. She stated that she sneezes and has nasal drips.  She stated that Flonase did not help.  She takes Trelegy Ellipta puff daily and Albuterol PRN for COPD. She denies shortness of breath nor wheezing.  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 10/2021 Cath: Nl cors.   Arthritis    Asthma    Breast cancer (HCC) 2002   infiltrative ductal carcinoma    Cataract 2019   Chronic heart failure with preserved ejection fraction (HFpEF) (HCC)    COPD (chronic obstructive pulmonary disease) (HCC) 07/2007   Coronary artery disease    Ductal carcinoma of breast, estrogen receptor positive, stage 1 (HCC) 09/16/2012   Endometriosis    GERD (gastroesophageal reflux disease)    Hx of mitral valve repair    Hypercalcemia 09/14/2013   Hypertension    Lesion of breast 1992   right, benign   Lung disease    secondary to [name] infection   Mastalgia 05/1994   Mitral regurgitation    a. 10/2021 RHC: PCWP V wave of 34; b. 01/2022 TEE: Severe flail/prolapse of large area of A2 scallop with severe posteriorly directed MR; c. 01/2022 TEER w /Mitraclip XTW x 2; d. 07/2022 Echo: EF 60-65%. No rwma, mild LVH, GrIII DD. Sev dil LA. Mod elev PASP. Mod-Sev MR. Mod MS.   Moderate mitral regurgitation by prior echocardiogram    Osteoporosis, post-menopausal 03/14/2012   Ovarian tumor of borderline malignancy, right 2004   Palpitations    a. 02/2022 Zio: Predominantly sinus rhythm at 67 (52-171).  2 runs of NSVT -  fastest 9 beats at 167.  35 SVT runs-longest 19 beats, fastest 171x6 beats.   Pancreatitis    secondary to Cholelithiasis   Parkinson disease (HCC) 02/10/2015   Post-thoracotomy pain syndrome    Pseudomonas pneumonia (HCC) 03/2008   Status post implantation of mitral valve leaflet clip 02/15/2022   s/p XTW MitraClip x2 by Dr. Excell Seltzer in the A2/P2: b. 04/03/23 s/p third mitraclip implantation.   Traumatic closed fracture of distal clavicle with minimal displacement, left,  initial encounter 06/2021   [facility]   Uterine fibroid    Vitamin D deficiency    Past Surgical History:  Procedure Laterality Date   ABDOMINAL HYSTERECTOMY     & BSO for Mucinous borderline tumor of R ovary 2004   APPENDECTOMY  2004   BREAST BIOPSY  08/2004   right breast-benign   BREAST LUMPECTOMY  1992   benign   BUBBLE STUDY  12/27/2021   Procedure: BUBBLE STUDY;  Surgeon: Sande Rives, MD;  Location: [facility] ENDOSCOPY;  Service: Cardiovascular;;   CARDIAC CATHETERIZATION  2007   essentially negation for significant CAD   CARDIOVERSION N/A 09/05/2023   Procedure: CARDIOVERSION;  Surgeon: Debbe Odea, MD;  Location: [facility] ORS;  Service: Cardiovascular;  Laterality: N/A;   CATARACT EXTRACTION W/PHACO Left 07/24/2023   Procedure: CATARACT EXTRACTION PHACO AND INTRAOCULAR LENS PLACEMENT (IOC) LEFT 5.34 0037.3;  Surgeon: Lockie Mola, MD;  Location: [facility] SURGERY CNTR;  Service: Ophthalmology;  Laterality: Left;   CATARACT EXTRACTION W/PHACO Right 08/07/2023   Procedure: CATARACT EXTRACTION PHACO AND INTRAOCULAR LENS PLACEMENT (IOC) RIGHT 7.83 00:47.9;  Surgeon: Lockie Mola, MD;  Location: [facility] SURGERY CNTR;  Service: Ophthalmology;  Laterality: Right;   CHOLECYSTECTOMY  2001   COLONOSCOPY  2014   Dr Marina Goodell , due 2019   COLONOSCOPY  09/2018   2 TA removed, int hem, no f/u needed (Dr Marina Goodell)    ESOPHAGOGASTRODUODENOSCOPY  09/2018   GERD with esophagitis and stricture dilated, antral gastritis negative for H pylori Marina Goodell)   HIP ARTHROPLASTY Left 08/21/2022   Procedure: ARTHROPLASTY BIPOLAR HIP (HEMIARTHROPLASTY);  Surgeon: Juanell Fairly, MD;  Location: [facility] ORS;  Service: Orthopedics;  Laterality: Left;   LUNG BIOPSY  2002   [name], Dr Edwyna Shell   MASTECTOMY MODIFIED RADICAL Left 2002   oral chemotheraphy (tamoxifen then Armidex) no radiation, Dr.[name]   RIGHT/LEFT HEART CATH AND CORONARY ANGIOGRAPHY Bilateral 11/14/2021   Procedure: RIGHT/LEFT HEART  CATH AND CORONARY ANGIOGRAPHY;  Surgeon: Iran Ouch, MD;  Location: [facility] INVASIVE CV LAB;  Service: Cardiovascular;  Laterality: Bilateral;   TEE WITHOUT CARDIOVERSION N/A 12/29/2018   Procedure: TRANSESOPHAGEAL ECHOCARDIOGRAM (TEE);  Surgeon: Iran Ouch, MD;  Location: [facility] ORS;  Service: Cardiovascular;  Laterality: N/A;   TEE WITHOUT CARDIOVERSION N/A 12/27/2021   Procedure: TRANSESOPHAGEAL ECHOCARDIOGRAM (TEE);  Surgeon: Sande Rives, MD;  Location: [facility] ENDOSCOPY;  Service: Cardiovascular;  Laterality: N/A;   TEE WITHOUT CARDIOVERSION N/A 02/15/2022   Procedure: TRANSESOPHAGEAL ECHOCARDIOGRAM (TEE);  Surgeon: Tonny Bollman, MD;  Location: [facility] INVASIVE CV LAB;  Service: Cardiovascular;  Laterality: N/A;   TEE WITHOUT CARDIOVERSION N/A 05/14/2022   Procedure: TRANSESOPHAGEAL ECHOCARDIOGRAM (TEE);  Surgeon: Sande Rives, MD;  Location: [facility] ENDOSCOPY;  Service: Cardiovascular;  Laterality: N/A;   TEE WITHOUT CARDIOVERSION N/A 04/03/2023   Procedure: TRANSESOPHAGEAL ECHOCARDIOGRAM;  Surgeon: Tonny Bollman, MD;  Location: [facility] INVASIVE CV LAB;  Service: Cardiovascular;  Laterality: N/A;   TEE WITHOUT CARDIOVERSION N/A 09/05/2023   Procedure: TRANSESOPHAGEAL ECHOCARDIOGRAM;  Surgeon: Debbe Odea, MD;  Location:  [facility] ORS;  Service: Cardiovascular;  Laterality: N/A;   TONSILLECTOMY  1946   TRANSCATHETER MITRAL EDGE TO EDGE REPAIR N/A 02/15/2022   Procedure: MITRAL VALVE REPAIR;  Surgeon: Tonny Bollman, MD;  Location: [facility] INVASIVE CV LAB;  Service: Cardiovascular;  Laterality: N/A;   TRANSCATHETER MITRAL EDGE TO EDGE REPAIR N/A 04/03/2023   Procedure: MITRAL VALVE REPAIR;  Surgeon: Tonny Bollman, MD;  Location: [facility] INVASIVE CV LAB;  Service: Cardiovascular;  Laterality: N/A;    Allergies  Allergen Reactions   Sulfa Antibiotics Anaphylaxis   Sulfonamide Derivatives Anaphylaxis   Topamax [Topiramate] Other (See Comments)    Metabolic acidosis    Clarithromycin Other  (See Comments)    pericarditis  Other reaction(s): Not available   Hydrocodone Other (See Comments)    "hyper and climbing the walls"   Motrin [Ibuprofen] Other (See Comments)    headaches   Misc. Sulfonamide Containing Compounds     Other reaction(s): Not available   Symbicort [Budesonide-Formoterol Fumarate] Other (See Comments)    02/07/15 tremor   Tetracycline Hcl Other (See Comments)   Percocet [Oxycodone-Acetaminophen] Itching and Rash   Tape Itching and Rash    Use paper tape only   Tetracyclines & Related Other (See Comments)    "immediate yeast infection"    Outpatient Encounter Medications as of 01/20/2024  Medication Sig   fexofenadine (ALLEGRA) 30 MG/5ML suspension Take 5 mLs (30 mg total) by mouth 2 (two) times daily.   acetaminophen (TYLENOL) 500 MG tablet Take 1,000 mg by mouth 3 (three) times daily.   albuterol (VENTOLIN HFA) 108 (90 Base) MCG/ACT inhaler Inhale 2 puffs into the lungs every 6 (six) hours as needed for wheezing or shortness of breath.   aluminum-magnesium hydroxide 200-200 MG/5ML suspension Take 10 mLs by mouth every 4 (four) hours as needed for indigestion.   amiodarone (PACERONE) 100 MG tablet Take 100 mg by mouth daily.   apixaban (ELIQUIS) 2.5 MG TABS tablet Take 1 tablet (2.5 mg total) by mouth 2 (two) times daily.   bisacodyl (DULCOLAX) 10 MG suppository Place 10 mg rectally as needed for moderate constipation.   calcium carbonate (TUMS - DOSED IN MG ELEMENTAL CALCIUM) 500 MG chewable tablet Chew 2 tablets by mouth daily as needed for indigestion or heartburn.   Calcium Carbonate Antacid 600 MG chewable tablet Chew 600 mg by mouth daily.   Carbidopa-Levodopa ER (SINEMET CR) 25-100 MG tablet controlled release TAKE TWO TABLET BY MOUTH AT NINE IN THE MORNING, ONE TAB AT ONE IN THE EVENING AND ONE TAB AT FIVE IN THE EVENING   Cholecalciferol (VITAMIN D3) 25 MCG (1000 UT) CAPS Take 1 capsule (1,000 Units total) by mouth daily.   clonazePAM (KLONOPIN)  0.5 MG tablet TAKE 0.25 A TABLET = 0.125 mg BY MOUTH AT BEDTIME   docusate sodium (COLACE) 100 MG capsule Take 100 mg by mouth 2 (two) times daily.   EPINEPHrine 0.3 mg/0.3 mL IJ SOAJ injection Inject 0.3 mg into the muscle as needed for anaphylaxis.   fluticasone (FLONASE) 50 MCG/ACT nasal spray Place into both nostrils.   Fluticasone-Umeclidin-Vilant (TRELEGY ELLIPTA) 100-62.5-25 MCG/ACT AEPB Inhale 1 puff into the lungs daily.   furosemide (LASIX) 40 MG tablet TAKE ONE TABLET BY MOUTH ONCE DAILY *NEW DOSE*   gabapentin ([name]) 300 MG capsule Take Two capsules by mouth twice daily.   magnesium hydroxide (MILK OF MAGNESIA) 400 MG/5ML suspension Take 30 mLs by mouth daily as needed for mild constipation.   pantoprazole (PROTONIX) 20 MG tablet Take  1 tablet (20 mg total) by mouth daily.   polyethylene glycol (MIRALAX / GLYCOLAX) 17 g packet Take 17 g by mouth daily as needed.   [DISCONTINUED] cetirizine (ZYRTEC) 10 MG tablet Take 10 mg by mouth daily.   No facility-administered encounter medications on file as of 01/20/2024.    Review of Systems  Constitutional:  Negative for appetite change, chills, fatigue and fever.  HENT:  Positive for postnasal drip and sneezing. Negative for congestion, hearing loss, rhinorrhea and sore throat.   Eyes: Negative.   Respiratory:  Negative for cough, shortness of breath and wheezing.   Cardiovascular:  Negative for chest pain, palpitations and leg swelling.  Gastrointestinal:  Negative for abdominal pain, constipation, diarrhea, nausea and vomiting.  Genitourinary:  Negative for dysuria.  Musculoskeletal:  Negative for arthralgias, back pain and myalgias.  Skin:  Negative for color change, rash and wound.  Neurological:  Negative for dizziness, weakness and headaches.  Psychiatric/Behavioral:  Negative for behavioral problems. The patient is not nervous/anxious.        Immunization History  Administered Date(s) Administered   Fluad Quad(high  Dose 65+) 08/11/2019, 08/03/2020, 08/08/2021, 09/06/2022   Influenza Split 09/11/2011   Influenza Whole 09/09/2007, 08/08/2008, 08/15/2010, 08/26/2012   Influenza,inj,Quad PF,6+ Mos 08/18/2013, 08/20/2014, 08/25/2018   Influenza-Unspecified 09/27/2015, 08/26/2017, 09/18/2023   Moderna Covid-19 Fall Seasonal Vaccine 21yrs & older 10/25/2022   Moderna Covid-19 Vaccine Bivalent Booster 59yrs & up 04/24/2022, 03/05/2023   Moderna Sars-Covid-2 Vaccination 01/01/2020, 01/20/2020, 02/16/2020, 07/25/2020, 11/22/2020   PPD Test 08/24/2022   Pneumococcal Conjugate-13 02/12/2014   Pneumococcal Polysaccharide-23 11/26/2006, 10/21/2012, 11/22/2020, 11/21/2021   Rsv, Bivalent, Protein Subunit Rsvpref,pf Verdis Frederickson) 10/25/2022   Td 01/01/2011, 06/30/2021   Zoster Recombinant(Shingrix) 02/22/2018, 07/08/2019   Zoster, Live 05/26/2007   Pertinent  Health Maintenance Due  Topic Date Due   INFLUENZA VACCINE  Completed   DEXA SCAN  Completed   Colonoscopy  Discontinued      11/25/2022    8:30 PM 11/26/2022    9:30 AM 12/07/2022   11:43 AM 01/29/2023   10:32 AM 08/01/2023   10:29 AM  Fall Risk  Falls in the past year?   1 1 0  Was there an injury with Fall?   1 1 0  Fall Risk Category Calculator   3 3 0  Fall Risk Category (Retired)   High    (RETIRED) Patient Fall Risk Level High fall risk High fall risk     Fall risk Follow up     Falls evaluation completed     Vitals:   01/20/24 1819  BP: (!) 112/57  Pulse: (!) 57  Resp: 20  Temp: 97.7 F (36.5 C)  SpO2: 93%  Weight: 122 lb 12.8 oz (55.7 kg)  Height: 5\' 2"  (1.575 m)   Body mass index is 22.46 kg/m.  Physical Exam Constitutional:      Appearance: Normal appearance.  HENT:     Head: Normocephalic and atraumatic.     Nose: Nose normal.     Mouth/Throat:     Mouth: Mucous membranes are moist.  Eyes:     Conjunctiva/sclera: Conjunctivae normal.  Cardiovascular:     Rate and Rhythm: Normal rate and regular rhythm.  Pulmonary:      Effort: Pulmonary effort is normal.     Breath sounds: Normal breath sounds.  Abdominal:     General: Bowel sounds are normal.     Palpations: Abdomen is soft.  Musculoskeletal:        General:  Normal range of motion.     Cervical back: Normal range of motion.  Skin:    General: Skin is warm and dry.  Neurological:     General: No focal deficit present.     Mental Status: She is alert and oriented to person, place, and time.  Psychiatric:        Mood and Affect: Mood normal.        Behavior: Behavior normal.      Labs reviewed: Recent Labs    09/04/23 1057 09/05/23 0521 09/06/23 0345 09/07/23 0313 09/08/23 0417 09/16/23 0000 11/14/23 0000  NA  --    < > 135 135 139 140 143  K  --    < > 4.1 3.6 4.1 3.7 4.4  CL  --    < > 100 100 102 101 101  CO2  --    < > 23 26 26  31* 36*  GLUCOSE  --    < > 149* 111* 84  --   --   BUN  --    < > 27* 27* 23 17 18   CREATININE  --    < > 1.07* 0.82 0.78 0.9 0.8  CALCIUM  --    < > 8.4* 7.8* 8.3* 9.1 9.9  MG 2.1  --   --   --   --   --   --    < > = values in this interval not displayed.   Recent Labs    04/01/23 0816 08/05/23 1114 09/05/23 0521  AST 20 13 21   ALT 5 2 14   ALKPHOS 67 98 80  BILITOT 2.0* 1.0 1.5*  PROT 6.9 7.6 6.7  ALBUMIN 3.8 3.7 3.4*   Recent Labs    09/06/23 0345 09/07/23 0313 09/08/23 0417 09/26/23 0000  WBC 5.2 6.2 6.0 3.8  NEUTROABS 4.5 4.8 4.3  --   HGB 11.9* 11.8* 12.7 12.4  HCT 36.4 36.8 38.9 39  MCV 101.1* 100.0 99.0  --   PLT 145* 140* 162 173   Lab Results  Component Value Date   TSH 1.145 09/07/2023   Lab Results  Component Value Date   HGBA1C 5.3 01/14/2018   Lab Results  Component Value Date   CHOL 171 08/08/2021   HDL 53.30 08/08/2021   LDLCALC 97 08/08/2021   LDLDIRECT 135.0 08/16/2009   TRIG 100.0 08/08/2021   CHOLHDL 3 08/08/2021    Significant Diagnostic Results in last 30 days:  No results found.  Assessment/Plan  1. Environmental and seasonal allergies  (Primary) -  will start Allegra - fexofenadine (ALLEGRA) 30 MG/5ML suspension; Take 5 mLs (30 mg total) by mouth 2 (two) times daily.  2. Chronic obstructive pulmonary disease, unspecified COPD type (HCC) -  stable -  continue current regimen    Family/ staff Communication: Discussed plan of care with resident and charge nurse  Labs/tests ordered: None    Kenard Gower, DNP, MSN, FNP-BC [facility] [phone] (Monday-Friday 8:00 a.m. - 5:00 p.m.) [phone] (after hours)

## 2024-01-31 NOTE — Progress Notes (Unsigned)
 Assessment/Plan:   1.  Parkinsons Disease  -Increase carbidopa/levodopa 25/100 CR, 2/2/1.  Changing from IR did not change hypersomnolence, but I did not change back because of orthostasis.  Her Parkinson's actually looks very well-controlled and really have not seen any significant decline with time.  She is perhaps a tad underdosed today but she was due for medication at the time I saw her.  We decided to slightly increase the dosage as she overall felt her Parkinsons Disease was progressing.  -she asks about transferring care to [facility] neurology for ease and I am happy to have her do that.  Told her I could send referral and she wanted to think about that first.  She will let us know. 2.  EDS             -don't think that related to PD 3.  Orthostatic hypotension, with history of associated syncope             -Refuses compression binder.             -Previously prescribed Northera but did not take.    -Patient is on blood pressure medications, but does not want to change that given her worry about her thoracic aortic aneurysm.  She does not want to stop her amlodopine.  States that when she did this in the past, her blood pressure raised up too high.  Discussed the concept of permissive hypertension with her with Parkinson's disease. 4.  REM behavior disorder              -unclear if on clonazepam 0.5 mg, half tablet at bedtime.[name]  PDMP reviewed and hasn't beenn filled for long time.  No [facility] records accompany her but suspect that they d/c when she went to snf.   5.  Mild cognitive impairment             -Last neurocognitive testing was quite a long time ago in September, 2018.  I have not seen significant change since that time and we discussed this again today.  She is noting some worsening of memory and discussed repeating but she wants to hold for now 6.  Severe mitral valve stenosis  -Status post mitral valve repair in March, 2023 and a second repair that was successful in May,  2024.  -Post second repair, patient is much better, but still remains with mild to moderate MR  -Patient continues with exertional dyspnea/shortness of breath. 7.  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  A-fib with rapid ventricular response  -Status post cardioversion October, 2024  -Amiodarone started October, 2024  -Now on Eliquis  Subjective:   Crystal Bass was seen today in follow up for Parkinsons disease.  My previous records were reviewed prior to todays visit as well as outside records available to me.  Pt with brother who supplements history.  At our last visit, the patient was complaining about a lot of shortness of breath and I told her that she needed to call her pulmonologist and let them know.  She saw the pulmonologist only a few days later.  Testing was ordered and patient was subsequently hospitalized with community-acquired pneumonia.  Only a few weeks after that, she was again hospitalized with A-fib with rapid ventricular response and acute respiratory failure and acute on chronic heart failure.  She underwent cardioversion on September 05, 2023.  She  was started on amiodarone.  She is now on Eliquis as well.  She was last seen by the cardiology nurse practitioner in January.  Notes are reviewed.  She was last seen at the facility February 24 and I reviewed those notes as well.  Current prescribed movement disorder medications: Carbidopa/levodopa 25/100 CR, 2 tablets in the morning, 1 in the afternoon and 1 in the evening Clonazepam 0.5 mg, half tablet at night   PREVIOUS MEDICATIONS: Sinemet (changed from IR due to EDS but not sure it really made a difference but we did not change back because of NOH); northera (RX by cardiology but not taken)  ALLERGIES:   Allergies  Allergen Reactions   Sulfa Antibiotics Anaphylaxis   Sulfonamide Derivatives Anaphylaxis    Topamax [Topiramate] Other (See Comments)    Metabolic acidosis    Clarithromycin Other (See Comments)    pericarditis  Other reaction(s): Not available   Hydrocodone Other (See Comments)    "hyper and climbing the walls"   Motrin [Ibuprofen] Other (See Comments)    headaches   Misc. Sulfonamide Containing Compounds     Other reaction(s): Not available   Symbicort [Budesonide-Formoterol Fumarate] Other (See Comments)    02/07/15 tremor   Tetracycline Hcl Other (See Comments)   Percocet [Oxycodone-Acetaminophen] Itching and Rash   Tape Itching and Rash    Use paper tape only   Tetracyclines & Related Other (See Comments)    "immediate yeast infection"    CURRENT MEDICATIONS:  Outpatient Encounter Medications as of 02/04/2024  Medication Sig   acetaminophen (TYLENOL) 500 MG tablet Take 1,000 mg by mouth 3 (three) times daily.   albuterol (VENTOLIN HFA) 108 (90 Base) MCG/ACT inhaler Inhale 2 puffs into the lungs every 6 (six) hours as needed for wheezing or shortness of breath.   amiodarone (PACERONE) 100 MG tablet Take 100 mg by mouth daily.   apixaban (ELIQUIS) 2.5 MG TABS tablet Take 1 tablet (2.5 mg total) by mouth 2 (two) times daily.   bisacodyl (DULCOLAX) 10 MG suppository Place 10 mg rectally as needed for moderate constipation.   calcium carbonate (TUMS - DOSED IN MG ELEMENTAL CALCIUM) 500 MG chewable tablet Chew 2 tablets by mouth daily as needed for indigestion or heartburn.   Calcium Carbonate Antacid 600 MG chewable tablet Chew 600 mg by mouth daily.   Carbidopa-Levodopa ER (SINEMET CR) 25-100 MG tablet controlled release TAKE TWO TABLET BY MOUTH AT NINE IN THE MORNING, ONE TAB AT ONE IN THE EVENING AND ONE TAB AT FIVE IN THE EVENING   Cholecalciferol (VITAMIN D3) 25 MCG (1000 UT) CAPS Take 1 capsule (1,000 Units total) by mouth daily.   clonazePAM (KLONOPIN) 0.5 MG tablet TAKE 0.25 A TABLET = 0.125 mg BY MOUTH AT BEDTIME   docusate sodium (COLACE) 100 MG capsule Take 100  mg by mouth 2 (two) times daily.   EPINEPHrine 0.3 mg/0.3 mL IJ SOAJ injection Inject 0.3 mg into the muscle as needed for anaphylaxis.   fexofenadine (ALLEGRA) 30 MG/5ML suspension Take 5 mLs (30 mg total) by mouth 2 (two) times daily.   fluticasone (FLONASE) 50 MCG/ACT nasal spray Place into both nostrils.   Fluticasone-Umeclidin-Vilant (TRELEGY ELLIPTA) 100-62.5-25 MCG/ACT AEPB Inhale 1 puff into the lungs daily.   furosemide (LASIX) 40 MG tablet TAKE ONE TABLET BY MOUTH ONCE DAILY *NEW DOSE*   gabapentin ([name]) 300 MG capsule Take Two capsules by mouth twice daily.   magnesium hydroxide (MILK OF MAGNESIA) 400 MG/5ML suspension Take 30 mLs by  mouth daily as needed for mild constipation.   pantoprazole (PROTONIX) 20 MG tablet Take 1 tablet (20 mg total) by mouth daily.   polyethylene glycol (MIRALAX / GLYCOLAX) 17 g packet Take 17 g by mouth daily as needed.   aluminum-magnesium hydroxide 200-200 MG/5ML suspension Take 10 mLs by mouth every 4 (four) hours as needed for indigestion. (Patient not taking: Reported on 02/04/2024)   No facility-administered encounter medications on file as of 02/04/2024.    Objective:   PHYSICAL EXAMINATION:    VITALS:   Vitals:   02/04/24 1259  BP: 138/70  Pulse: (!) 56  SpO2: 96%  Weight: 125 lb 9.6 oz (57 kg)  Height: 5\' 2"  (1.575 m)    GEN:  The patient appears stated age and is in NAD. HEENT:  Normocephalic, atraumatic.  The mucous membranes are moist. The superficial temporal arteries are without ropiness or tenderness. CV:  RRR Lungs:  CTAB.  There is DOE Neck/HEME:  There are no carotid bruits bilaterally.  Neurological examination:  Orientation: The patient is alert and oriented x3. Cranial nerves: There is good facial symmetry with facial hypomimia. No ptosis today.  The speech is fluent and clear. Soft palate rises symmetrically and there is no tongue deviation. Hearing is intact to conversational tone. Sensation: Sensation is intact  to light touch throughout Motor: Strength is at least antigravity x4.  Movement examination: Tone: There is mild increased tone in the RUE (she is due for medication) Abnormal movements: RUE tremor is felt Coordination:  There is mild decremation with finger taps on the right and heel/toe taps, esp with toe taps on the L Gait and Station: The patient has no difficulty arising out of a deep-seated chair without the use of the hands. The patient's stride length is good but she is cautious/tenuous with ambulation.  She has her walker but shows me ambulation without it and its pretty good  I have reviewed and interpreted the following labs independently    Chemistry      Component Value Date/Time   NA 143 11/14/2023 0000   NA 141 03/28/2017 1130   K 4.4 11/14/2023 0000   K 4.2 03/28/2017 1130   CL 101 11/14/2023 0000   CL 102 03/10/2013 1529   CO2 36 (A) 11/14/2023 0000   CO2 28 03/28/2017 1130   BUN 18 11/14/2023 0000   BUN 17.3 03/28/2017 1130   CREATININE 0.8 11/14/2023 0000   CREATININE 0.78 09/08/2023 0417   CREATININE 0.8 03/28/2017 1130   GLU 75 11/14/2023 0000      Component Value Date/Time   CALCIUM 9.9 11/14/2023 0000   CALCIUM 9.6 03/28/2017 1130   ALKPHOS 80 09/05/2023 0521   ALKPHOS 85 03/28/2017 1130   AST 21 09/05/2023 0521   AST 12 03/28/2017 1130   ALT 14 09/05/2023 0521   ALT <6 03/28/2017 1130   BILITOT 1.5 (H) 09/05/2023 0521   BILITOT 1.38 (H) 03/28/2017 1130       Lab Results  Component Value Date   WBC 3.8 09/26/2023   HGB 12.4 09/26/2023   HCT 39 09/26/2023   MCV 99.0 09/08/2023   PLT 173 09/26/2023    Lab Results  Component Value Date   TSH 1.145 09/07/2023     Total time spent on today's visit was 31 minutes, including both face-to-face time and nonface-to-face time.  Time included that spent on review of records (prior notes available to me/labs/imaging if pertinent), discussing treatment and goals, answering patient's questions and  coordinating care.  Cc:  Earnestine Mealing, MD

## 2024-02-04 ENCOUNTER — Ambulatory Visit: Payer: Medicare PPO | Admitting: Neurology

## 2024-02-04 VITALS — BP 138/70 | HR 56 | Ht 62.0 in | Wt 125.6 lb

## 2024-02-04 DIAGNOSIS — R0602 Shortness of breath: Secondary | ICD-10-CM

## 2024-02-04 DIAGNOSIS — G20A1 Parkinson's disease without dyskinesia, without mention of fluctuations: Secondary | ICD-10-CM | POA: Diagnosis not present

## 2024-02-04 MED ORDER — CARBIDOPA-LEVODOPA ER 25-100 MG PO TBCR: EXTENDED_RELEASE_TABLET | ORAL | 1 refills | Status: DC

## 2024-02-09 ENCOUNTER — Other Ambulatory Visit: Payer: Self-pay

## 2024-02-09 ENCOUNTER — Emergency Department

## 2024-02-09 ENCOUNTER — Inpatient Hospital Stay
Admission: EM | Admit: 2024-02-09 | Discharge: 2024-02-13 | DRG: 480 | Disposition: A | Discharge status: Skilled Nursing Facility | Source: Skilled Nursing Facility | Attending: Osteopathic Medicine | Admitting: Osteopathic Medicine

## 2024-02-09 DIAGNOSIS — S72001A Fracture of unspecified part of neck of right femur, initial encounter for closed fracture: Secondary | ICD-10-CM | POA: Diagnosis present

## 2024-02-09 DIAGNOSIS — Z9012 Acquired absence of left breast and nipple: Secondary | ICD-10-CM | POA: Diagnosis not present

## 2024-02-09 DIAGNOSIS — Z91048 Other nonmedicinal substance allergy status: Secondary | ICD-10-CM

## 2024-02-09 DIAGNOSIS — Z1152 Encounter for screening for COVID-19: Secondary | ICD-10-CM | POA: Diagnosis not present

## 2024-02-09 DIAGNOSIS — I5032 Chronic diastolic (congestive) heart failure: Secondary | ICD-10-CM | POA: Diagnosis not present

## 2024-02-09 DIAGNOSIS — Z886 Allergy status to analgesic agent status: Secondary | ICD-10-CM

## 2024-02-09 DIAGNOSIS — S52521A Torus fracture of lower end of right radius, initial encounter for closed fracture: Secondary | ICD-10-CM | POA: Diagnosis present

## 2024-02-09 DIAGNOSIS — I4819 Other persistent atrial fibrillation: Secondary | ICD-10-CM | POA: Diagnosis present

## 2024-02-09 DIAGNOSIS — M810 Age-related osteoporosis without current pathological fracture: Secondary | ICD-10-CM | POA: Diagnosis present

## 2024-02-09 DIAGNOSIS — Z515 Encounter for palliative care: Secondary | ICD-10-CM | POA: Diagnosis not present

## 2024-02-09 DIAGNOSIS — Z825 Family history of asthma and other chronic lower respiratory diseases: Secondary | ICD-10-CM

## 2024-02-09 DIAGNOSIS — R918 Other nonspecific abnormal finding of lung field: Secondary | ICD-10-CM | POA: Diagnosis not present

## 2024-02-09 DIAGNOSIS — E43 Unspecified severe protein-calorie malnutrition: Secondary | ICD-10-CM | POA: Diagnosis present

## 2024-02-09 DIAGNOSIS — Z800 Family history of malignant neoplasm of digestive organs: Secondary | ICD-10-CM

## 2024-02-09 DIAGNOSIS — W010XXA Fall on same level from slipping, tripping and stumbling without subsequent striking against object, initial encounter: Secondary | ICD-10-CM | POA: Diagnosis present

## 2024-02-09 DIAGNOSIS — M1811 Unilateral primary osteoarthritis of first carpometacarpal joint, right hand: Secondary | ICD-10-CM | POA: Diagnosis not present

## 2024-02-09 DIAGNOSIS — R54 Age-related physical debility: Secondary | ICD-10-CM | POA: Diagnosis present

## 2024-02-09 DIAGNOSIS — I340 Nonrheumatic mitral (valve) insufficiency: Secondary | ICD-10-CM | POA: Diagnosis present

## 2024-02-09 DIAGNOSIS — Z95818 Presence of other cardiac implants and grafts: Secondary | ICD-10-CM

## 2024-02-09 DIAGNOSIS — S52551A Other extraarticular fracture of lower end of right radius, initial encounter for closed fracture: Secondary | ICD-10-CM | POA: Diagnosis present

## 2024-02-09 DIAGNOSIS — W19XXXA Unspecified fall, initial encounter: Secondary | ICD-10-CM | POA: Diagnosis not present

## 2024-02-09 DIAGNOSIS — Z833 Family history of diabetes mellitus: Secondary | ICD-10-CM

## 2024-02-09 DIAGNOSIS — I272 Pulmonary hypertension, unspecified: Secondary | ICD-10-CM | POA: Diagnosis not present

## 2024-02-09 DIAGNOSIS — Z7401 Bed confinement status: Secondary | ICD-10-CM | POA: Diagnosis not present

## 2024-02-09 DIAGNOSIS — Z79899 Other long term (current) drug therapy: Secondary | ICD-10-CM

## 2024-02-09 DIAGNOSIS — Z888 Allergy status to other drugs, medicaments and biological substances status: Secondary | ICD-10-CM

## 2024-02-09 DIAGNOSIS — G20A1 Parkinson's disease without dyskinesia, without mention of fluctuations: Secondary | ICD-10-CM | POA: Diagnosis not present

## 2024-02-09 DIAGNOSIS — R0989 Other specified symptoms and signs involving the circulatory and respiratory systems: Secondary | ICD-10-CM | POA: Diagnosis not present

## 2024-02-09 DIAGNOSIS — I2489 Other forms of acute ischemic heart disease: Secondary | ICD-10-CM | POA: Diagnosis present

## 2024-02-09 DIAGNOSIS — S6990XA Unspecified injury of unspecified wrist, hand and finger(s), initial encounter: Secondary | ICD-10-CM | POA: Diagnosis not present

## 2024-02-09 DIAGNOSIS — Z9049 Acquired absence of other specified parts of digestive tract: Secondary | ICD-10-CM

## 2024-02-09 DIAGNOSIS — S5291XA Unspecified fracture of right forearm, initial encounter for closed fracture: Secondary | ICD-10-CM | POA: Insufficient documentation

## 2024-02-09 DIAGNOSIS — J449 Chronic obstructive pulmonary disease, unspecified: Secondary | ICD-10-CM | POA: Diagnosis present

## 2024-02-09 DIAGNOSIS — Z8262 Family history of osteoporosis: Secondary | ICD-10-CM

## 2024-02-09 DIAGNOSIS — S72141A Displaced intertrochanteric fracture of right femur, initial encounter for closed fracture: Secondary | ICD-10-CM | POA: Diagnosis not present

## 2024-02-09 DIAGNOSIS — R7989 Other specified abnormal findings of blood chemistry: Secondary | ICD-10-CM

## 2024-02-09 DIAGNOSIS — I10 Essential (primary) hypertension: Secondary | ICD-10-CM | POA: Diagnosis not present

## 2024-02-09 DIAGNOSIS — Z820 Family history of epilepsy and other diseases of the nervous system: Secondary | ICD-10-CM

## 2024-02-09 DIAGNOSIS — J479 Bronchiectasis, uncomplicated: Secondary | ICD-10-CM | POA: Diagnosis not present

## 2024-02-09 DIAGNOSIS — Z885 Allergy status to narcotic agent status: Secondary | ICD-10-CM | POA: Diagnosis not present

## 2024-02-09 DIAGNOSIS — Z882 Allergy status to sulfonamides status: Secondary | ICD-10-CM | POA: Diagnosis not present

## 2024-02-09 DIAGNOSIS — Z96642 Presence of left artificial hip joint: Secondary | ICD-10-CM | POA: Diagnosis not present

## 2024-02-09 DIAGNOSIS — Z7189 Other specified counseling: Secondary | ICD-10-CM | POA: Diagnosis not present

## 2024-02-09 DIAGNOSIS — I951 Orthostatic hypotension: Secondary | ICD-10-CM | POA: Diagnosis present

## 2024-02-09 DIAGNOSIS — I2510 Atherosclerotic heart disease of native coronary artery without angina pectoris: Secondary | ICD-10-CM | POA: Diagnosis present

## 2024-02-09 DIAGNOSIS — S52501D Unspecified fracture of the lower end of right radius, subsequent encounter for closed fracture with routine healing: Secondary | ICD-10-CM | POA: Diagnosis not present

## 2024-02-09 DIAGNOSIS — Z801 Family history of malignant neoplasm of trachea, bronchus and lung: Secondary | ICD-10-CM

## 2024-02-09 DIAGNOSIS — Z8052 Family history of malignant neoplasm of bladder: Secondary | ICD-10-CM

## 2024-02-09 DIAGNOSIS — Z66 Do not resuscitate: Secondary | ICD-10-CM | POA: Diagnosis not present

## 2024-02-09 DIAGNOSIS — S7290XA Unspecified fracture of unspecified femur, initial encounter for closed fracture: Secondary | ICD-10-CM | POA: Diagnosis present

## 2024-02-09 DIAGNOSIS — Z803 Family history of malignant neoplasm of breast: Secondary | ICD-10-CM

## 2024-02-09 DIAGNOSIS — I4891 Unspecified atrial fibrillation: Secondary | ICD-10-CM | POA: Diagnosis not present

## 2024-02-09 DIAGNOSIS — Z8249 Family history of ischemic heart disease and other diseases of the circulatory system: Secondary | ICD-10-CM

## 2024-02-09 DIAGNOSIS — Z86711 Personal history of pulmonary embolism: Secondary | ICD-10-CM

## 2024-02-09 DIAGNOSIS — R0689 Other abnormalities of breathing: Secondary | ICD-10-CM | POA: Diagnosis not present

## 2024-02-09 DIAGNOSIS — Z87892 Personal history of anaphylaxis: Secondary | ICD-10-CM

## 2024-02-09 DIAGNOSIS — Z7901 Long term (current) use of anticoagulants: Secondary | ICD-10-CM | POA: Diagnosis not present

## 2024-02-09 DIAGNOSIS — S52531A Colles' fracture of right radius, initial encounter for closed fracture: Secondary | ICD-10-CM | POA: Diagnosis not present

## 2024-02-09 DIAGNOSIS — E559 Vitamin D deficiency, unspecified: Secondary | ICD-10-CM | POA: Diagnosis present

## 2024-02-09 DIAGNOSIS — M6281 Muscle weakness (generalized): Secondary | ICD-10-CM | POA: Diagnosis not present

## 2024-02-09 DIAGNOSIS — Z90710 Acquired absence of both cervix and uterus: Secondary | ICD-10-CM

## 2024-02-09 DIAGNOSIS — S52501A Unspecified fracture of the lower end of right radius, initial encounter for closed fracture: Secondary | ICD-10-CM | POA: Diagnosis not present

## 2024-02-09 DIAGNOSIS — Z01811 Encounter for preprocedural respiratory examination: Principal | ICD-10-CM

## 2024-02-09 DIAGNOSIS — Z808 Family history of malignant neoplasm of other organs or systems: Secondary | ICD-10-CM

## 2024-02-09 DIAGNOSIS — Z0189 Encounter for other specified special examinations: Secondary | ICD-10-CM | POA: Diagnosis not present

## 2024-02-09 DIAGNOSIS — Z836 Family history of other diseases of the respiratory system: Secondary | ICD-10-CM

## 2024-02-09 DIAGNOSIS — R0902 Hypoxemia: Secondary | ICD-10-CM | POA: Diagnosis not present

## 2024-02-09 DIAGNOSIS — Z01810 Encounter for preprocedural cardiovascular examination: Secondary | ICD-10-CM | POA: Diagnosis not present

## 2024-02-09 DIAGNOSIS — Z853 Personal history of malignant neoplasm of breast: Secondary | ICD-10-CM

## 2024-02-09 DIAGNOSIS — Z6822 Body mass index (BMI) 22.0-22.9, adult: Secondary | ICD-10-CM

## 2024-02-09 DIAGNOSIS — R262 Difficulty in walking, not elsewhere classified: Secondary | ICD-10-CM | POA: Diagnosis not present

## 2024-02-09 DIAGNOSIS — Z823 Family history of stroke: Secondary | ICD-10-CM

## 2024-02-09 DIAGNOSIS — I110 Hypertensive heart disease with heart failure: Secondary | ICD-10-CM | POA: Diagnosis not present

## 2024-02-09 DIAGNOSIS — R079 Chest pain, unspecified: Secondary | ICD-10-CM | POA: Diagnosis not present

## 2024-02-09 DIAGNOSIS — J309 Allergic rhinitis, unspecified: Secondary | ICD-10-CM | POA: Diagnosis not present

## 2024-02-09 DIAGNOSIS — M25539 Pain in unspecified wrist: Secondary | ICD-10-CM | POA: Diagnosis not present

## 2024-02-09 DIAGNOSIS — S72141D Displaced intertrochanteric fracture of right femur, subsequent encounter for closed fracture with routine healing: Secondary | ICD-10-CM | POA: Diagnosis not present

## 2024-02-09 LAB — PROCALCITONIN: Procalcitonin: <0.1 ng/mL

## 2024-02-09 LAB — CBC WITH DIFFERENTIAL/PLATELET
Abs Immature Granulocytes: 0.1 10*3/uL — ABNORMAL HIGH (ref 0.00–0.07)
Basophils Absolute: 0 10*3/uL (ref 0.0–0.1)
Basophils Relative: 1 %
Eosinophils Absolute: 0.1 10*3/uL (ref 0.0–0.5)
Eosinophils Relative: 2 %
HCT: 39.8 % (ref 36.0–46.0)
Hemoglobin: 12.9 g/dL (ref 12.0–15.0)
Immature Granulocytes: 2 %
Lymphocytes Relative: 19 %
Lymphs Abs: 1.2 10*3/uL (ref 0.7–4.0)
MCH: 32.3 pg (ref 26.0–34.0)
MCHC: 32.4 g/dL (ref 30.0–36.0)
MCV: 99.5 fL (ref 80.0–100.0)
Monocytes Absolute: 0.7 10*3/uL (ref 0.1–1.0)
Monocytes Relative: 11 %
Neutro Abs: 4.3 10*3/uL (ref 1.7–7.7)
Neutrophils Relative %: 65 %
Platelets: 158 10*3/uL (ref 150–400)
RBC: 4 MIL/uL (ref 3.87–5.11)
RDW: 13.4 % (ref 11.5–15.5)
WBC: 6.5 10*3/uL (ref 4.0–10.5)
nRBC: 0 % (ref 0.0–0.2)

## 2024-02-09 LAB — BRAIN NATRIURETIC PEPTIDE: B Natriuretic Peptide: 467.8 pg/mL — ABNORMAL HIGH (ref 0.0–100.0)

## 2024-02-09 LAB — BASIC METABOLIC PANEL
Anion gap: 11 (ref 5–15)
BUN: 19 mg/dL (ref 8–23)
CO2: 28 mmol/L (ref 22–32)
Calcium: 9.7 mg/dL (ref 8.9–10.3)
Chloride: 98 mmol/L (ref 98–111)
Creatinine, Ser: 1.06 mg/dL — ABNORMAL HIGH (ref 0.44–1.00)
GFR, Estimated: 53 mL/min — ABNORMAL LOW (ref >60)
Glucose, Bld: 107 mg/dL — ABNORMAL HIGH (ref 70–99)
Potassium: 3.9 mmol/L (ref 3.5–5.1)
Sodium: 137 mmol/L (ref 135–145)

## 2024-02-09 LAB — PROTIME-INR
INR: 1.2 (ref 0.8–1.2)
Prothrombin Time: 15.5 s — ABNORMAL HIGH (ref 11.4–15.2)

## 2024-02-09 LAB — RESP PANEL BY RT-PCR (RSV, FLU A&B, COVID)  RVPGX2
Influenza A by PCR: NEGATIVE
Influenza B by PCR: NEGATIVE
Resp Syncytial Virus by PCR: NEGATIVE
SARS Coronavirus 2 by RT PCR: NEGATIVE

## 2024-02-09 LAB — SAMPLE TO BLOOD BANK

## 2024-02-09 LAB — TROPONIN I (HIGH SENSITIVITY): Troponin I (High Sensitivity): 87 ng/L — ABNORMAL HIGH (ref <18)

## 2024-02-09 LAB — APTT: aPTT: 31 s (ref 24–36)

## 2024-02-09 MED ORDER — METHOCARBAMOL 500 MG PO TABS: 500.0000 mg | ORAL_TABLET | Freq: Four times a day (QID) | ORAL | Status: DC | PRN

## 2024-02-09 MED ORDER — CARBIDOPA-LEVODOPA ER 25-100 MG PO TBCR: 2.0000 | EXTENDED_RELEASE_TABLET | Freq: Four times a day (QID) | ORAL | Status: DC

## 2024-02-09 MED ORDER — FLUTICASONE FUROATE-VILANTEROL 100-25 MCG/ACT IN AEPB
1.0000 | INHALATION_SPRAY | Freq: Every day | RESPIRATORY_TRACT | Status: DC
  Filled 2024-02-09: qty 28

## 2024-02-09 MED ORDER — MORPHINE SULFATE (PF) 4 MG/ML IV SOLN
4.0000 mg | Freq: Once | INTRAVENOUS | Status: AC
  Administered 2024-02-09: 4 mg via INTRAVENOUS
  Filled 2024-02-09: qty 1

## 2024-02-09 MED ORDER — METHOCARBAMOL 1000 MG/10ML IJ SOLN: 500.0000 mg | Freq: Four times a day (QID) | INTRAMUSCULAR | Status: DC | PRN

## 2024-02-09 MED ORDER — NITROGLYCERIN 0.4 MG SL SUBL: 0.4000 mg | SUBLINGUAL_TABLET | SUBLINGUAL | Status: DC | PRN

## 2024-02-09 MED ORDER — SENNOSIDES-DOCUSATE SODIUM 8.6-50 MG PO TABS: 1.0000 | ORAL_TABLET | Freq: Every evening | ORAL | Status: DC | PRN

## 2024-02-09 MED ORDER — FUROSEMIDE 10 MG/ML IJ SOLN
40.0000 mg | Freq: Once | INTRAMUSCULAR | Status: AC
  Administered 2024-02-09: 40 mg via INTRAVENOUS
  Filled 2024-02-09: qty 4

## 2024-02-09 MED ORDER — ONDANSETRON HCL 4 MG/2ML IJ SOLN
4.0000 mg | Freq: Once | INTRAMUSCULAR | Status: AC
  Administered 2024-02-09: 4 mg via INTRAVENOUS
  Filled 2024-02-09: qty 2

## 2024-02-09 MED ORDER — FUROSEMIDE 40 MG PO TABS
40.0000 mg | ORAL_TABLET | Freq: Every day | ORAL | Status: DC
  Administered 2024-02-12 – 2024-02-13 (×2): 40 mg via ORAL
  Filled 2024-02-09 (×3): qty 1

## 2024-02-09 MED ORDER — SODIUM CHLORIDE 0.9 % IV BOLUS
500.0000 mL | Freq: Once | INTRAVENOUS | Status: AC
  Administered 2024-02-09: 500 mL via INTRAVENOUS

## 2024-02-09 MED ORDER — HYDROMORPHONE HCL 1 MG/ML IJ SOLN
0.5000 mg | INTRAMUSCULAR | Status: DC | PRN
  Administered 2024-02-09 – 2024-02-11 (×4): 0.5 mg via INTRAVENOUS
  Filled 2024-02-09 (×4): qty 0.5

## 2024-02-09 MED ORDER — DOCUSATE SODIUM 100 MG PO CAPS
100.0000 mg | ORAL_CAPSULE | Freq: Two times a day (BID) | ORAL | Status: DC
  Filled 2024-02-09: qty 1

## 2024-02-09 MED ORDER — ALBUTEROL SULFATE (2.5 MG/3ML) 0.083% IN NEBU: 2.5000 mg | INHALATION_SOLUTION | Freq: Four times a day (QID) | RESPIRATORY_TRACT | Status: DC | PRN

## 2024-02-09 MED ORDER — CEFAZOLIN SODIUM-DEXTROSE 2-4 GM/100ML-% IV SOLN
2.0000 g | INTRAVENOUS | Status: AC
  Administered 2024-02-10: 2 g via INTRAVENOUS
  Filled 2024-02-09: qty 100

## 2024-02-09 MED ORDER — FEXOFENADINE HCL 30 MG/5ML PO SUSP: 30.0000 mg | Freq: Two times a day (BID) | ORAL | Status: DC

## 2024-02-09 MED ORDER — HYDROCODONE-ACETAMINOPHEN 5-325 MG PO TABS
1.0000 | ORAL_TABLET | Freq: Four times a day (QID) | ORAL | Status: DC | PRN
  Administered 2024-02-10: 2 via ORAL
  Filled 2024-02-09: qty 2
  Filled 2024-02-09: qty 1

## 2024-02-09 MED ORDER — AMIODARONE HCL 100 MG PO TABS: 100.0000 mg | ORAL_TABLET | Freq: Every day | ORAL | Status: DC

## 2024-02-09 MED ORDER — UMECLIDINIUM BROMIDE 62.5 MCG/ACT IN AEPB
1.0000 | INHALATION_SPRAY | Freq: Every day | RESPIRATORY_TRACT | Status: DC
  Filled 2024-02-09: qty 7

## 2024-02-09 NOTE — ED Notes (Signed)
 Dr. Para March was paged in regards to elevated troponin and BNP. Awaiting call back.

## 2024-02-09 NOTE — Assessment & Plan Note (Signed)
 Buckle fracture.  Manage per orthopedics

## 2024-02-09 NOTE — Assessment & Plan Note (Signed)
 Holding Eliquis for surgery Continue amiodarone

## 2024-02-09 NOTE — ED Provider Notes (Signed)
 [facility] Provider Note    Event Date/Time   First MD Initiated Contact with Patient 02/09/24 1641     (approximate)   History   Chief Complaint: Fall   HPI  Crystal Bass is a 83 y.o. female with a history of Parkinson's disease, breast cancer, COPD, pulmonary embolism, hypertension who is brought to the ED due to a fall and right hip pain.  Patient reports that she was reaching for a great on a table and lost her balance and fell over onto her right side.  Also has pain in the right wrist.  Denies hitting her head, no neck pain.  Denies chest pain or trouble breathing.  She is on Eliquis.          Physical Exam   Triage Vital Signs: ED Triage Vitals  Encounter Vitals Group     BP 02/09/24 1651 (!) 162/98     Systolic BP Percentile --      Diastolic BP Percentile --      Pulse Rate 02/09/24 1651 67     Resp 02/09/24 1651 20     Temp 02/09/24 1651 98.2 F (36.8 C)     Temp Source 02/09/24 1651 Oral     SpO2 02/09/24 1651 94 %     Weight 02/09/24 1649 125 lb 10.6 oz (57 kg)     Height 02/09/24 1649 5\' 2"  (1.575 m)     Head Circumference --      Peak Flow --      Pain Score 02/09/24 1648 8     Pain Loc --      Pain Education --      Exclude from Growth Chart --     Most recent vital signs: Vitals:   02/09/24 1651  BP: (!) 162/98  Pulse: 67  Resp: 20  Temp: 98.2 F (36.8 C)  SpO2: 94%    General: Awake, no distress.  CV:  Good peripheral perfusion.  Regular rate rhythm.  Intact distal pulses Resp:  Normal effort.  Clear to auscultation bilaterally Abd:  No distention.  Soft nontender Other:  Shortening of the right lower extremity.  There is right hip tenderness at the greater trochanter.  There is also tenderness at the distal right radius but without deformity or open wound.   ED Results / Procedures / Treatments   Labs (all labs ordered are listed, but only abnormal results are displayed) Labs Reviewed  BASIC  METABOLIC PANEL - Abnormal; Notable for the following components:      Result Value   Glucose, Bld 107 (*)    Creatinine, Ser 1.06 (*)    GFR, Estimated 53 (*)    All other components within normal limits  CBC WITH DIFFERENTIAL/PLATELET - Abnormal; Notable for the following components:   Abs Immature Granulocytes 0.10 (*)    All other components within normal limits  PROTIME-INR  APTT  SAMPLE TO BLOOD BANK     EKG    RADIOLOGY X-ray right hip and pelvis interpreted by me, reveals intertrochanteric fracture of the right femur with displacement.  Radiology report reviewed   PROCEDURES:  Procedures   MEDICATIONS ORDERED IN ED: Medications  ceFAZolin (ANCEF) IVPB 2g/100 mL premix (has no administration in time range)  morphine (PF) 4 MG/ML injection 4 mg (4 mg Intravenous Given 02/09/24 1740)  sodium chloride 0.9 % bolus 500 mL (0 mLs Intravenous Stopped 02/09/24 1848)  ondansetron (ZOFRAN) injection 4 mg (4 mg Intravenous Given 02/09/24  1739)     IMPRESSION / MDM / ASSESSMENT AND PLAN / ED COURSE  I reviewed the triage vital signs and the nursing notes.  DDx: Right hip fracture, right wrist fracture, anemia, AKI, electrolyte derangement  Patient's presentation is most consistent with acute presentation with potential threat to life or bodily function.     Clinical Course as of 02/09/24 1849  Wynelle Link Feb 09, 2024  1707 Patient presents with right wrist pain and right hip pain after mechanical fall.  She was reaching for a grape and lost her balance and fell on the floor.  No head injury, no neck pain, no midline spinal tenderness.  Has some right leg shortening.  No deformity of the right wrist but does have tenderness at the distal radius.  Will check x-rays, give IV morphine and check labs. [PS]  1752 Case d/w ortho Dr. Joice Lofts - will plan for surgery tomorrow for R hip fx. [PS]    Clinical Course User Index [PS] Sharman Cheek, MD    ----------------------------------------- 6:49 PM on 02/09/2024 ----------------------------------------- Case d/w hospitalist.    FINAL CLINICAL IMPRESSION(S) / ED DIAGNOSES   Final diagnoses:  Intertrochanteric fracture of right femur, closed, initial encounter (HCC)  Parkinson's disease, unspecified whether dyskinesia present, unspecified whether manifestations fluctuate (HCC)  Closed torus fracture of distal end of right radius, initial encounter     Rx / DC Orders   ED Discharge Orders     None        Note:  This document was prepared using Dragon voice recognition software and may include unintentional dictation errors.   Sharman Cheek, MD 02/09/24 [phone]

## 2024-02-09 NOTE — H&P (Signed)
 History and Physical    Patient: Crystal Bass:[id number] DOB: 11/23/1941 DOA: 02/09/2024 DOS: the patient was seen and examined on 02/09/2024 PCP: Earnestine Mealing, MD  Patient coming from: Home  Chief Complaint:  Chief Complaint  Patient presents with   Fall    HPI: Crystal Bass is a 83 y.o. female with medical history significant for Parkinson's disease, COPD/bronchiectasis, mitral regurgitation s/p MitraClip in 2023 with redo 03/2023, diastolic CHF history of PE in 5784, A-fib in October s/p cardioversion, on Eliquis and amiodarone  being admitted with an accidental fall fall in which she sustained a right intertrochanteric fracture and a buckle fracture of the right distal radius.  Dr. Joice Lofts is aware and will take patient to the OR on 3/17 Patient was previously in her usual state of health.  Denies cough, fever, preceding chest pain, shortness of breath, palpitations, blurred vision, one-sided weakness numbness or tingling or headache. In the ED, slightly hypertensive to 162/98 but with otherwise normal vitals. Labs including CBC and BMP mostly on room creatinine of 1.06 up from her baseline of 0.8 Chest x-ray showed an enlarged cardiac silhouette and some pulmonary vascular congestion with increased consolidation within the medial right upper lung zone respecting airspace disease superimposed upon known chronic scarring and bronchiectasis. Patient treated with NS bolus, morphine and Zofran The ED provider spoke with Dr. Joice Lofts who take patient to the OR tomorrow 3/17.  Right wrist placed in a splint Hospitalist consulted for admission. Will get troponin and BNP and EKG based on chest x-ray findings     Past Medical History:  Diagnosis Date   Aneurysm of aorta (HCC)    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   Anginal pain (HCC)    a. 10/2021 Cath: Nl cors.   Arthritis    Asthma    Breast cancer (HCC) 2002   infiltrative ductal carcinoma    Cataract 2019   Chronic  heart failure with preserved ejection fraction (HFpEF) (HCC)    COPD (chronic obstructive pulmonary disease) (HCC) 07/2007   Coronary artery disease    Ductal carcinoma of breast, estrogen receptor positive, stage 1 (HCC) 09/16/2012   Endometriosis    GERD (gastroesophageal reflux disease)    Hx of mitral valve repair    Hypercalcemia 09/14/2013   Hypertension    Lesion of breast 1992   right, benign   Lung disease    secondary to [name] infection   Mastalgia 05/1994   Mitral regurgitation    a. 10/2021 RHC: PCWP V wave of 34; b. 01/2022 TEE: Severe flail/prolapse of large area of A2 scallop with severe posteriorly directed MR; c. 01/2022 TEER w /Mitraclip XTW x 2; d. 07/2022 Echo: EF 60-65%. No rwma, mild LVH, GrIII DD. Sev dil LA. Mod elev PASP. Mod-Sev MR. Mod MS.   Moderate mitral regurgitation by prior echocardiogram    Osteoporosis, post-menopausal 03/14/2012   Ovarian tumor of borderline malignancy, right 2004   Palpitations    a. 02/2022 Zio: Predominantly sinus rhythm at 67 (52-171).  2 runs of NSVT -fastest 9 beats at 167.  35 SVT runs-longest 19 beats, fastest 171x6 beats.   Pancreatitis    secondary to Cholelithiasis   Parkinson disease (HCC) 02/10/2015   Post-thoracotomy pain syndrome    Pseudomonas pneumonia (HCC) 03/2008   Status post implantation of mitral valve leaflet clip 02/15/2022   s/p XTW MitraClip x2 by Dr. Excell Seltzer in the A2/P2: b. 04/03/23 s/p third mitraclip implantation.   Traumatic closed  fracture of distal clavicle with minimal displacement, left, initial encounter 06/2021   [facility]   Uterine fibroid    Vitamin D deficiency    Past Surgical History:  Procedure Laterality Date   ABDOMINAL HYSTERECTOMY     & BSO for Mucinous borderline tumor of R ovary 2004   APPENDECTOMY  2004   BREAST BIOPSY  08/2004   right breast-benign   BREAST LUMPECTOMY  1992   benign   BUBBLE STUDY  12/27/2021   Procedure: BUBBLE STUDY;  Surgeon: Sande Rives, MD;   Location: [facility] ENDOSCOPY;  Service: Cardiovascular;;   CARDIAC CATHETERIZATION  2007   essentially negation for significant CAD   CARDIOVERSION N/A 09/05/2023   Procedure: CARDIOVERSION;  Surgeon: Debbe Odea, MD;  Location: [facility] ORS;  Service: Cardiovascular;  Laterality: N/A;   CATARACT EXTRACTION W/PHACO Left 07/24/2023   Procedure: CATARACT EXTRACTION PHACO AND INTRAOCULAR LENS PLACEMENT (IOC) LEFT 5.34 0037.3;  Surgeon: Lockie Mola, MD;  Location: [facility] SURGERY CNTR;  Service: Ophthalmology;  Laterality: Left;   CATARACT EXTRACTION W/PHACO Right 08/07/2023   Procedure: CATARACT EXTRACTION PHACO AND INTRAOCULAR LENS PLACEMENT (IOC) RIGHT 7.83 00:47.9;  Surgeon: Lockie Mola, MD;  Location: [facility] SURGERY CNTR;  Service: Ophthalmology;  Laterality: Right;   CHOLECYSTECTOMY  2001   COLONOSCOPY  2014   Dr Marina Goodell , due 2019   COLONOSCOPY  09/2018   2 TA removed, int hem, no f/u needed (Dr Marina Goodell)    ESOPHAGOGASTRODUODENOSCOPY  09/2018   GERD with esophagitis and stricture dilated, antral gastritis negative for H pylori Marina Goodell)   HIP ARTHROPLASTY Left 08/21/2022   Procedure: ARTHROPLASTY BIPOLAR HIP (HEMIARTHROPLASTY);  Surgeon: Juanell Fairly, MD;  Location: [facility] ORS;  Service: Orthopedics;  Laterality: Left;   LUNG BIOPSY  2002   [name], Dr Edwyna Shell   MASTECTOMY MODIFIED RADICAL Left 2002   oral chemotheraphy (tamoxifen then Armidex) no radiation, Dr.[name]   RIGHT/LEFT HEART CATH AND CORONARY ANGIOGRAPHY Bilateral 11/14/2021   Procedure: RIGHT/LEFT HEART CATH AND CORONARY ANGIOGRAPHY;  Surgeon: Iran Ouch, MD;  Location: [facility] INVASIVE CV LAB;  Service: Cardiovascular;  Laterality: Bilateral;   TEE WITHOUT CARDIOVERSION N/A 12/29/2018   Procedure: TRANSESOPHAGEAL ECHOCARDIOGRAM (TEE);  Surgeon: Iran Ouch, MD;  Location: [facility] ORS;  Service: Cardiovascular;  Laterality: N/A;   TEE WITHOUT CARDIOVERSION N/A 12/27/2021   Procedure: TRANSESOPHAGEAL  ECHOCARDIOGRAM (TEE);  Surgeon: Sande Rives, MD;  Location: [facility] ENDOSCOPY;  Service: Cardiovascular;  Laterality: N/A;   TEE WITHOUT CARDIOVERSION N/A 02/15/2022   Procedure: TRANSESOPHAGEAL ECHOCARDIOGRAM (TEE);  Surgeon: Tonny Bollman, MD;  Location: [facility] INVASIVE CV LAB;  Service: Cardiovascular;  Laterality: N/A;   TEE WITHOUT CARDIOVERSION N/A 05/14/2022   Procedure: TRANSESOPHAGEAL ECHOCARDIOGRAM (TEE);  Surgeon: Sande Rives, MD;  Location: [facility] ENDOSCOPY;  Service: Cardiovascular;  Laterality: N/A;   TEE WITHOUT CARDIOVERSION N/A 04/03/2023   Procedure: TRANSESOPHAGEAL ECHOCARDIOGRAM;  Surgeon: Tonny Bollman, MD;  Location: [facility] INVASIVE CV LAB;  Service: Cardiovascular;  Laterality: N/A;   TEE WITHOUT CARDIOVERSION N/A 09/05/2023   Procedure: TRANSESOPHAGEAL ECHOCARDIOGRAM;  Surgeon: Debbe Odea, MD;  Location: [facility] ORS;  Service: Cardiovascular;  Laterality: N/A;   TONSILLECTOMY  1946   TRANSCATHETER MITRAL EDGE TO EDGE REPAIR N/A 02/15/2022   Procedure: MITRAL VALVE REPAIR;  Surgeon: Tonny Bollman, MD;  Location: [facility] INVASIVE CV LAB;  Service: Cardiovascular;  Laterality: N/A;   TRANSCATHETER MITRAL EDGE TO EDGE REPAIR N/A 04/03/2023   Procedure: MITRAL VALVE REPAIR;  Surgeon: Tonny Bollman, MD;  Location: [facility] INVASIVE CV LAB;  Service: Cardiovascular;  Laterality: N/A;   Social History:  reports that she has never smoked. She has never used smokeless tobacco. She reports that she does not drink alcohol and does not use drugs.  Allergies  Allergen Reactions   Sulfa Antibiotics Anaphylaxis   Sulfonamide Derivatives Anaphylaxis   Topamax [Topiramate] Other (See Comments)    Metabolic acidosis    Clarithromycin Other (See Comments)    pericarditis  Other reaction(s): Not available   Hydrocodone Other (See Comments)    "hyper and climbing the walls"   Motrin [Ibuprofen] Other (See Comments)    headaches   Misc. Sulfonamide Containing Compounds     Other  reaction(s): Not available   Symbicort [Budesonide-Formoterol Fumarate] Other (See Comments)    02/07/15 tremor   Tetracycline Hcl Other (See Comments)   Percocet [Oxycodone-Acetaminophen] Itching and Rash   Tape Itching and Rash    Use paper tape only   Tetracyclines & Related Other (See Comments)    "immediate yeast infection"    Family History  Problem Relation Age of Onset   Emphysema Mother        d.64 was never a smoker   Lung cancer Father 25       d.82 history of smoking   Breast cancer Sister 22   Colon cancer Sister    Melanoma Maternal Aunt    Liver cancer Maternal Aunt    Cancer Maternal Uncle        unspecified type   Cancer Maternal Uncle        unspecified type   Cancer Maternal Uncle        unspecified type   Stroke Paternal Aunt        in mid 24s   Osteoporosis Paternal Aunt    Myasthenia gravis Paternal Aunt    Cancer Maternal Grandmother        d.82s unspecified GI cancer   Cancer Maternal Grandfather        d.62s unspecified type   Breast cancer Cousin        d.60s-daughter of unaffected paternal aunt Doristine Church   Colon cancer Cousin 60       d.70-daughter of unaffected maternal aunt Leila   Lung cancer Cousin 70       d.70-sisters to each other, both daughters of maternal uncle Johnny   Parkinson's disease Cousin    Diabetes Neg Hx    Heart disease Neg Hx    Stomach cancer Neg Hx    Ulcerative colitis Neg Hx     Prior to Admission medications   Medication Sig Start Date End Date Taking? Authorizing Provider  clonazePAM (KLONOPIN) 0.5 MG tablet TAKE 0.25 A TABLET = 0.125 mg BY MOUTH AT BEDTIME 01/17/24  Yes Medina-Vargas, Monina C, NP  Fluticasone-Umeclidin-Vilant (TRELEGY ELLIPTA) 100-62.5-25 MCG/ACT AEPB Inhale 1 puff into the lungs daily. 04/27/22  Yes Cobb, Ruby Cola, NP  nitroGLYCERIN (NITROSTAT) 0.4 MG SL tablet Place 0.4 mg under the tongue every 5 (five) minutes as needed for chest pain. 12/30/23  Yes [provider]  acetaminophen  (TYLENOL) 500 MG tablet Take 1,000 mg by mouth 3 (three) times daily.    [provider]  albuterol (VENTOLIN HFA) 108 (90 Base) MCG/ACT inhaler Inhale 2 puffs into the lungs every 6 (six) hours as needed for wheezing or shortness of breath. 06/04/22   Eustaquio Boyden, MD  aluminum-magnesium hydroxide 200-200 MG/5ML suspension Take 10 mLs by mouth every 4 (four) hours as needed for indigestion. Patient not taking:  Reported on 02/04/2024    [provider]  amiodarone (PACERONE) 100 MG tablet Take 100 mg by mouth daily.    [provider]  apixaban (ELIQUIS) 2.5 MG TABS tablet Take 1 tablet (2.5 mg total) by mouth 2 (two) times daily. 08/27/23   Iran Ouch, MD  bisacodyl (DULCOLAX) 10 MG suppository Place 10 mg rectally as needed for moderate constipation.    [provider]  calcium carbonate (TUMS - DOSED IN MG ELEMENTAL CALCIUM) 500 MG chewable tablet Chew 2 tablets by mouth daily as needed for indigestion or heartburn.    [provider]  Calcium Carbonate Antacid 600 MG chewable tablet Chew 600 mg by mouth daily.    [provider]  Carbidopa-Levodopa ER (SINEMET CR) 25-100 MG tablet controlled release 2 at 9am, 2 at 1pm, 1 at 5pm 02/04/24   Tat, Octaviano Batty, DO  Cholecalciferol (VITAMIN D3) 25 MCG (1000 UT) CAPS Take 1 capsule (1,000 Units total) by mouth daily. 09/27/22   Eustaquio Boyden, MD  docusate sodium (COLACE) 100 MG capsule Take 100 mg by mouth 2 (two) times daily.    [provider]  EPINEPHrine 0.3 mg/0.3 mL IJ SOAJ injection Inject 0.3 mg into the muscle as needed for anaphylaxis. 02/25/20   [provider]  fexofenadine (ALLEGRA) 30 MG/5ML suspension Take 5 mLs (30 mg total) by mouth 2 (two) times daily. 01/20/24   Medina-Vargas, Monina C, NP  fluticasone (FLONASE) 50 MCG/ACT nasal spray Place into both nostrils. 10/15/23   [provider]  furosemide (LASIX) 40 MG tablet TAKE ONE TABLET BY MOUTH ONCE  DAILY *NEW DOSE* 03/29/23   Iran Ouch, MD  gabapentin ([name]) 300 MG capsule Take Two capsules by mouth twice daily. 12/19/23   Earnestine Mealing, MD  magnesium hydroxide (MILK OF MAGNESIA) 400 MG/5ML suspension Take 30 mLs by mouth daily as needed for mild constipation.    [provider]  pantoprazole (PROTONIX) 20 MG tablet Take 1 tablet (20 mg total) by mouth daily. 04/01/23   Eustaquio Boyden, MD  polyethylene glycol (MIRALAX / GLYCOLAX) 17 g packet Take 17 g by mouth daily as needed.    [provider]    Physical Exam: Vitals:   02/09/24 1649 02/09/24 1651  BP:  (!) 162/98  Pulse:  67  Resp:  20  Temp:  98.2 F (36.8 C)  TempSrc:  Oral  SpO2:  94%  Weight: 57 kg   Height: 5\' 2"  (1.575 m)    Physical Exam Vitals and nursing note reviewed.  Constitutional:      General: She is not in acute distress.    Comments: Very frail-appearing elderly female  HENT:     Head: Normocephalic and atraumatic.  Cardiovascular:     Rate and Rhythm: Normal rate and regular rhythm.     Heart sounds: Normal heart sounds.  Pulmonary:     Effort: Pulmonary effort is normal.     Breath sounds: Normal breath sounds.  Abdominal:     Palpations: Abdomen is soft.     Tenderness: There is no abdominal tenderness.  Neurological:     Mental Status: Mental status is at baseline.     Labs on Admission: I have personally reviewed following labs and imaging studies  CBC: Recent Labs  Lab 02/09/24 1654  WBC 6.5  NEUTROABS 4.3  HGB 12.9  HCT 39.8  MCV 99.5  PLT 158   Basic Metabolic Panel: Recent Labs  Lab 02/09/24 1654  NA  137  K 3.9  CL 98  CO2 28  GLUCOSE 107*  BUN 19  CREATININE 1.06*  CALCIUM 9.7   GFR: Estimated Creatinine Clearance: 33.5 mL/min (A) (by C-G formula based on SCr of 1.06 mg/dL (H)). Liver Function Tests: No results for input(s): "AST", "ALT", "ALKPHOS", "BILITOT", "PROT", "ALBUMIN" in the last 168 hours. No results for input(s):  "LIPASE", "AMYLASE" in the last 168 hours. No results for input(s): "AMMONIA" in the last 168 hours. Coagulation Profile: No results for input(s): "INR", "PROTIME" in the last 168 hours. Cardiac Enzymes: No results for input(s): "CKTOTAL", "CKMB", "CKMBINDEX", "TROPONINI" in the last 168 hours. BNP (last 3 results) Recent Labs    08/05/23 1114  PROBNP 484.0*   HbA1C: No results for input(s): "HGBA1C" in the last 72 hours. CBG: No results for input(s): "GLUCAP" in the last 168 hours. Lipid Profile: No results for input(s): "CHOL", "HDL", "LDLCALC", "TRIG", "CHOLHDL", "LDLDIRECT" in the last 72 hours. Thyroid Function Tests: No results for input(s): "TSH", "T4TOTAL", "FREET4", "T3FREE", "THYROIDAB" in the last 72 hours. Anemia Panel: No results for input(s): "VITAMINB12", "FOLATE", "FERRITIN", "TIBC", "IRON", "RETICCTPCT" in the last 72 hours. Urine analysis:    Component Value Date/Time   COLORURINE STRAW (A) 09/04/2023 1249   APPEARANCEUR CLEAR (A) 09/04/2023 1249   LABSPEC 1.008 09/04/2023 1249   PHURINE 5.0 09/04/2023 1249   GLUCOSEU NEGATIVE 09/04/2023 1249   HGBUR NEGATIVE 09/04/2023 1249   BILIRUBINUR NEGATIVE 09/04/2023 1249   BILIRUBINUR Negative 09/02/2023 1244   KETONESUR NEGATIVE 09/04/2023 1249   PROTEINUR NEGATIVE 09/04/2023 1249   UROBILINOGEN 0.2 09/02/2023 1244   NITRITE NEGATIVE 09/04/2023 1249   LEUKOCYTESUR NEGATIVE 09/04/2023 1249    Radiological Exams on Admission: DG Chest 1 View Result Date: 02/09/2024 CLINICAL DATA:  Larey Seat, pain EXAM: CHEST  1 VIEW COMPARISON:  09/04/2023 FINDINGS: Single frontal view of the chest demonstrates stable enlargement of the cardiac silhouette. Postsurgical changes are seen from mitral valve repair. There is increased consolidation within the medial right upper lung zone since prior study, consistent with airspace disease superimposed upon chronic atelectasis and bronchiectasis seen on prior CTs. Increased pulmonary vascular  congestion. No effusion or pneumothorax. No acute bony abnormalities. IMPRESSION: 1. Enlarged cardiac silhouette. 2. Pulmonary vascular congestion. Increased consolidation within the medial right upper lung zone may reflect airspace disease superimposed upon known chronic scarring and bronchiectasis. Electronically Signed   By: Sharlet Salina M.D.   On: 02/09/2024 17:44   DG Hip Unilat W or Wo Pelvis 2-3 Views Right Result Date: 02/09/2024 CLINICAL DATA:  Right hip pain EXAM: DG HIP (WITH OR WITHOUT PELVIS) 2-3V RIGHT COMPARISON:  09/22/2022 FINDINGS: Acute, mildly comminuted intertrochanteric fracture of the proximal right femur with mild displacement and varus angulation. Hip joint alignment is maintained without dislocation. Prior left hip arthroplasty. Bony pelvis intact without evidence of fracture or diastasis. Soft tissue swelling overlies the right hip laterally. IMPRESSION: Acute, mildly comminuted intertrochanteric fracture of the proximal right femur. Electronically Signed   By: Duanne Guess D.O.   On: 02/09/2024 17:43   DG Wrist Complete Right Result Date: 02/09/2024 CLINICAL DATA:  Larey Seat, right wrist pain EXAM: RIGHT WRIST - COMPLETE 3+ VIEW COMPARISON:  None Available. FINDINGS: Frontal, oblique, lateral, and ulnar deviated views of the right wrist are obtained. There is an impacted extra-articular fracture through the distal right radial metaphysis, with mild impaction at the fracture site. Anatomic alignment of the radiocarpal joint. Prominent osteoarthritis within the radial aspect of the carpus and at the  first carpometacarpal joint. Mild soft tissue swelling. IMPRESSION: 1. Impacted extra-articular fracture of the distal radial metaphysis, with near anatomic alignment. 2. 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 3. Soft tissue swelling. Electronically Signed   By: Sharlet Salina M.D.   On: 02/09/2024 17:41     Data  Reviewed: Relevant notes from primary care and specialist visits, past discharge summaries as available in EHR, including Care Everywhere. Prior diagnostic testing as pertinent to current admission diagnoses Updated medications and problem lists for reconciliation ED course, including vitals, labs, imaging, treatment and response to treatment Triage notes, nursing and pharmacy notes and ED provider's notes Notable results as noted in HPI   Assessment and Plan: * Closed right hip fracture, initial encounter (HCC) Secondary to accidental fall Pain control Holding Eliquis N.p.o. from midnight Further orders per orthopedics  Atrial fibrillation s/p cardioversion 08/2023 [facility]) Holding Eliquis for surgery Continue amiodarone  Chronic heart failure with preserved ejection fraction (HFpEF) (HCC) Chest x-ray suggesting pulmonary vascular congestion, patient appears clinically overloaded EF 50 to 55% on TEE 08/2023 with indeterminate diastolic parameters Will get troponin, BNP and EKG to facilitate cardiac clearance for surgery-->trop 87, BNP 467, EKG with NSR at 66 with biventricular hypertrophy Will give a dose of IV Lasix and continue with home Lasix dose We will also get respiratory viral panel given findings on x-ray.  Procalcitonin was less than 0.1 Daily weights  Elevated troponin Troponin 87-152 possible demand ischemia Patient denies chest pain and EKG with no ischemic changes Echocardiogram to evaluate for wall motion abnormality  Closed right radial fracture Buckle fracture.  Manage per orthopedics  COPD (chronic obstructive pulmonary disease) (HCC) Continue home inhalers DuoNebs if needed   History of pulmonary embolism No acute issues suspected   S/P mitral valve clip implantation S/p MitraClip's x 2 due to inadequate symptomatic improvement after first MitraClip in 2023 No acute issues suspected Follow-up with cardiologist     Parkinson disease Continue  Sinemet   Frailty PT eval Ambulates with rollator/walker at baseline .  Had prior left hip fracture   DVT prophylaxis: SCD  Consults: Orthopedics, Dr. Joice Lofts, [facility] cardiology for clearance  Advance Care Planning:   Code Status: Prior   Family Communication: none  Disposition Plan: Back to previous home environment  Severity of Illness: The appropriate patient status for this patient is INPATIENT. Inpatient status is judged to be reasonable and necessary in order to provide the required intensity of service to ensure the patient's safety. The patient's presenting symptoms, physical exam findings, and initial radiographic and laboratory data in the context of their chronic comorbidities is felt to place them at high risk for further clinical deterioration. Furthermore, it is not anticipated that the patient will be medically stable for discharge from the hospital within 2 midnights of admission.   * I certify that at the point of admission it is my clinical judgment that the patient will require inpatient hospital care spanning beyond 2 midnights from the point of admission due to high intensity of service, high risk for further deterioration and high frequency of surveillance required.*  Author: Andris Baumann, MD 02/09/2024 8:23 PM  For on call review [email or web address].

## 2024-02-09 NOTE — Assessment & Plan Note (Signed)
 Secondary to accidental fall Pain control Holding Eliquis N.p.o. from midnight Further orders per orthopedics

## 2024-02-09 NOTE — ED Triage Notes (Signed)
 Pt brought in by EMS from [facility] for an unwitnessed fall. Pt complaining of RLE pain and right wrist pain. of fentanyl administered by EMS PTA.

## 2024-02-09 NOTE — Assessment & Plan Note (Addendum)
 Chest x-ray suggesting pulmonary vascular congestion, patient appears clinically overloaded EF 50 to 55% on TEE 08/2023 with indeterminate diastolic parameters Will get troponin, BNP and EKG to facilitate cardiac clearance for surgery-->trop 87, BNP 467, EKG with NSR at 66 with biventricular hypertrophy Will give a dose of IV Lasix and continue with home Lasix dose We will also get respiratory viral panel given findings on x-ray.  Procalcitonin was less than 0.1 Daily weights

## 2024-02-10 ENCOUNTER — Inpatient Hospital Stay: Admitting: General Practice

## 2024-02-10 ENCOUNTER — Encounter: Payer: Self-pay | Admitting: Internal Medicine

## 2024-02-10 ENCOUNTER — Inpatient Hospital Stay

## 2024-02-10 ENCOUNTER — Inpatient Hospital Stay: Admit: 2024-02-10 | Discharge: 2024-02-10 | Disposition: A | Discharge status: Home / Self Care | Attending: Medical

## 2024-02-10 ENCOUNTER — Encounter
Admission: EM | Disposition: A | Discharge status: Skilled Nursing Facility | Payer: Self-pay | Source: Skilled Nursing Facility | Attending: Internal Medicine

## 2024-02-10 ENCOUNTER — Other Ambulatory Visit: Payer: Self-pay

## 2024-02-10 DIAGNOSIS — I5032 Chronic diastolic (congestive) heart failure: Secondary | ICD-10-CM

## 2024-02-10 DIAGNOSIS — I4891 Unspecified atrial fibrillation: Secondary | ICD-10-CM | POA: Diagnosis not present

## 2024-02-10 DIAGNOSIS — S72001A Fracture of unspecified part of neck of right femur, initial encounter for closed fracture: Secondary | ICD-10-CM | POA: Diagnosis not present

## 2024-02-10 DIAGNOSIS — R7989 Other specified abnormal findings of blood chemistry: Secondary | ICD-10-CM | POA: Diagnosis not present

## 2024-02-10 DIAGNOSIS — Z01810 Encounter for preprocedural cardiovascular examination: Secondary | ICD-10-CM

## 2024-02-10 HISTORY — PX: INTRAMEDULLARY (IM) NAIL INTERTROCHANTERIC: SHX5875

## 2024-02-10 LAB — SURGICAL PCR SCREEN
MRSA, PCR: NEGATIVE
Staphylococcus aureus: NEGATIVE

## 2024-02-10 LAB — BASIC METABOLIC PANEL
Anion gap: 12 (ref 5–15)
BUN: 22 mg/dL (ref 8–23)
CO2: 27 mmol/L (ref 22–32)
Calcium: 9.1 mg/dL (ref 8.9–10.3)
Chloride: 102 mmol/L (ref 98–111)
Creatinine, Ser: 0.81 mg/dL (ref 0.44–1.00)
GFR, Estimated: >60 mL/min (ref >60)
Glucose, Bld: 119 mg/dL — ABNORMAL HIGH (ref 70–99)
Potassium: 4.3 mmol/L (ref 3.5–5.1)
Sodium: 141 mmol/L (ref 135–145)

## 2024-02-10 LAB — CBC
HCT: 39.2 % (ref 36.0–46.0)
Hemoglobin: 12.6 g/dL (ref 12.0–15.0)
MCH: 31.9 pg (ref 26.0–34.0)
MCHC: 32.1 g/dL (ref 30.0–36.0)
MCV: 99.2 fL (ref 80.0–100.0)
Platelets: 138 10*3/uL — ABNORMAL LOW (ref 150–400)
RBC: 3.95 MIL/uL (ref 3.87–5.11)
RDW: 13.5 % (ref 11.5–15.5)
WBC: 11.1 10*3/uL — ABNORMAL HIGH (ref 4.0–10.5)
nRBC: 0 % (ref 0.0–0.2)

## 2024-02-10 LAB — ECHOCARDIOGRAM COMPLETE
AR max vel: 3.36 cm2
AV Area VTI: 4.39 cm2
AV Area mean vel: 3.85 cm2
AV Mean grad: 3 mmHg
AV Peak grad: 5.2 mmHg
Ao pk vel: 1.14 m/s
Area-P 1/2: 1.65 cm2
Height: 62 in
MV VTI: 1.24 cm2
S' Lateral: 2.4 cm
Weight: 2010.6 [oz_av]

## 2024-02-10 LAB — TROPONIN I (HIGH SENSITIVITY): Troponin I (High Sensitivity): 152 ng/L (ref <18)

## 2024-02-10 SURGERY — FIXATION, FRACTURE, INTERTROCHANTERIC, WITH INTRAMEDULLARY ROD
Anesthesia: General | Site: Hip | Laterality: Right

## 2024-02-10 MED ORDER — EPHEDRINE SULFATE-NACL 50-0.9 MG/10ML-% IV SOSY
PREFILLED_SYRINGE | INTRAVENOUS | Status: DC | PRN
  Administered 2024-02-10: 5 mg via INTRAVENOUS
  Administered 2024-02-10 (×2): 10 mg via INTRAVENOUS
  Administered 2024-02-10 (×3): 5 mg via INTRAVENOUS

## 2024-02-10 MED ORDER — METOCLOPRAMIDE HCL 5 MG/ML IJ SOLN: 5.0000 mg | Freq: Three times a day (TID) | INTRAMUSCULAR | Status: DC | PRN

## 2024-02-10 MED ORDER — CEFAZOLIN SODIUM-DEXTROSE 2-4 GM/100ML-% IV SOLN
2.0000 g | Freq: Four times a day (QID) | INTRAVENOUS | Status: AC
  Administered 2024-02-10 – 2024-02-11 (×2): 2 g via INTRAVENOUS
  Filled 2024-02-10: qty 100

## 2024-02-10 MED ORDER — PHENYLEPHRINE 80 MCG/ML (10ML) SYRINGE FOR IV PUSH (FOR BLOOD PRESSURE SUPPORT)
PREFILLED_SYRINGE | INTRAVENOUS | Status: AC
  Filled 2024-02-10: qty 10

## 2024-02-10 MED ORDER — EPINEPHRINE PF 1 MG/ML IJ SOLN
INTRAMUSCULAR | Status: AC
  Filled 2024-02-10: qty 1

## 2024-02-10 MED ORDER — 0.9 % SODIUM CHLORIDE (POUR BTL) OPTIME
TOPICAL | Status: DC | PRN
  Administered 2024-02-10: 500 mL

## 2024-02-10 MED ORDER — ENOXAPARIN SODIUM 40 MG/0.4ML IJ SOSY
40.0000 mg | PREFILLED_SYRINGE | INTRAMUSCULAR | Status: DC
  Filled 2024-02-10: qty 0.4

## 2024-02-10 MED ORDER — THROMBIN 5000 UNITS EX SOLR
INTRAVENOUS | Status: DC | PRN
  Administered 2024-02-10: 5 mL

## 2024-02-10 MED ORDER — THROMBIN 5000 UNITS EX KIT
PACK | CUTANEOUS | Status: AC
  Filled 2024-02-10: qty 1

## 2024-02-10 MED ORDER — CARBIDOPA-LEVODOPA ER 25-100 MG PO TBCR
2.0000 | EXTENDED_RELEASE_TABLET | Freq: Two times a day (BID) | ORAL | Status: DC
Start: 2024-02-10 — End: 2024-02-13
  Administered 2024-02-12 – 2024-02-13 (×3): 2 via ORAL
  Filled 2024-02-10 (×8): qty 2

## 2024-02-10 MED ORDER — DOCUSATE SODIUM 100 MG PO CAPS
100.0000 mg | ORAL_CAPSULE | Freq: Two times a day (BID) | ORAL | Status: DC
  Administered 2024-02-10 – 2024-02-12 (×4): 100 mg via ORAL
  Filled 2024-02-10 (×6): qty 1

## 2024-02-10 MED ORDER — SUGAMMADEX SODIUM 200 MG/2ML IV SOLN
INTRAVENOUS | Status: DC | PRN
  Administered 2024-02-10: 25 mg via INTRAVENOUS
  Administered 2024-02-10: 175 mg via INTRAVENOUS

## 2024-02-10 MED ORDER — TRANEXAMIC ACID 1000 MG/10ML IV SOLN
1000.0000 mg | Freq: Once | INTRAVENOUS | Status: DC
  Filled 2024-02-10: qty 10

## 2024-02-10 MED ORDER — TRANEXAMIC ACID-NACL 1000-0.7 MG/100ML-% IV SOLN
INTRAVENOUS | Status: AC
  Filled 2024-02-10: qty 100

## 2024-02-10 MED ORDER — TRANEXAMIC ACID-NACL 1000-0.7 MG/100ML-% IV SOLN
INTRAVENOUS | Status: DC | PRN
  Administered 2024-02-10: 1000 mg via INTRAVENOUS

## 2024-02-10 MED ORDER — MUPIROCIN 2 % EX OINT
1.0000 | TOPICAL_OINTMENT | Freq: Two times a day (BID) | CUTANEOUS | Status: DC
  Administered 2024-02-10 – 2024-02-13 (×4): 1 via NASAL
  Filled 2024-02-10 (×3): qty 22

## 2024-02-10 MED ORDER — METOCLOPRAMIDE HCL 10 MG PO TABS: 5.0000 mg | ORAL_TABLET | Freq: Three times a day (TID) | ORAL | Status: DC | PRN

## 2024-02-10 MED ORDER — ROCURONIUM BROMIDE 10 MG/ML (PF) SYRINGE
PREFILLED_SYRINGE | INTRAVENOUS | Status: AC
  Filled 2024-02-10: qty 10

## 2024-02-10 MED ORDER — SUGAMMADEX SODIUM 200 MG/2ML IV SOLN
INTRAVENOUS | Status: AC
  Filled 2024-02-10: qty 2

## 2024-02-10 MED ORDER — DIPHENHYDRAMINE HCL 12.5 MG/5ML PO ELIX: 12.5000 mg | ORAL_SOLUTION | ORAL | Status: DC | PRN

## 2024-02-10 MED ORDER — CEFAZOLIN SODIUM-DEXTROSE 2-4 GM/100ML-% IV SOLN
INTRAVENOUS | Status: AC
  Filled 2024-02-10: qty 100

## 2024-02-10 MED ORDER — LIDOCAINE HCL (CARDIAC) PF 100 MG/5ML IV SOSY
PREFILLED_SYRINGE | INTRAVENOUS | Status: DC | PRN
  Administered 2024-02-10: 80 mg via INTRAVENOUS

## 2024-02-10 MED ORDER — ONDANSETRON HCL 4 MG/2ML IJ SOLN
INTRAMUSCULAR | Status: DC | PRN
  Administered 2024-02-10: 4 mg via INTRAVENOUS

## 2024-02-10 MED ORDER — TRANEXAMIC ACID-NACL 1000-0.7 MG/100ML-% IV SOLN
1000.0000 mg | Freq: Once | INTRAVENOUS | Status: AC
  Administered 2024-02-10: 1000 mg via INTRAVENOUS

## 2024-02-10 MED ORDER — CALCIUM CARBONATE 1250 (500 CA) MG PO TABS
500.0000 mg | ORAL_TABLET | Freq: Every day | ORAL | Status: DC
  Administered 2024-02-12 – 2024-02-13 (×2): 1250 mg via ORAL
  Filled 2024-02-10 (×3): qty 1

## 2024-02-10 MED ORDER — ALBUMIN HUMAN 5 % IV SOLN
INTRAVENOUS | Status: AC
  Filled 2024-02-10: qty 500

## 2024-02-10 MED ORDER — FENTANYL CITRATE (PF) 100 MCG/2ML IJ SOLN
INTRAMUSCULAR | Status: DC | PRN
  Administered 2024-02-10 (×3): 25 ug via INTRAVENOUS

## 2024-02-10 MED ORDER — DEXAMETHASONE SODIUM PHOSPHATE 10 MG/ML IJ SOLN
INTRAMUSCULAR | Status: DC | PRN
  Administered 2024-02-10: 5 mg via INTRAVENOUS

## 2024-02-10 MED ORDER — PROPOFOL 10 MG/ML IV BOLUS
INTRAVENOUS | Status: DC | PRN
  Administered 2024-02-10: 100 mg via INTRAVENOUS
  Administered 2024-02-10: 40 mg via INTRAVENOUS

## 2024-02-10 MED ORDER — ONDANSETRON HCL 4 MG/2ML IJ SOLN
INTRAMUSCULAR | Status: AC
  Filled 2024-02-10: qty 2

## 2024-02-10 MED ORDER — PHENYLEPHRINE 80 MCG/ML (10ML) SYRINGE FOR IV PUSH (FOR BLOOD PRESSURE SUPPORT)
PREFILLED_SYRINGE | INTRAVENOUS | Status: DC | PRN
  Administered 2024-02-10: 160 ug via INTRAVENOUS
  Administered 2024-02-10 (×3): 120 ug via INTRAVENOUS
  Administered 2024-02-10: 160 ug via INTRAVENOUS
  Administered 2024-02-10: 120 ug via INTRAVENOUS
  Administered 2024-02-10: 160 ug via INTRAVENOUS
  Administered 2024-02-10: 120 ug via INTRAVENOUS

## 2024-02-10 MED ORDER — ACETAMINOPHEN 325 MG PO TABS
325.0000 mg | ORAL_TABLET | Freq: Four times a day (QID) | ORAL | Status: DC | PRN
  Administered 2024-02-11: 650 mg via ORAL
  Filled 2024-02-10: qty 2

## 2024-02-10 MED ORDER — CARBIDOPA-LEVODOPA ER 25-100 MG PO TBCR
1.0000 | EXTENDED_RELEASE_TABLET | Freq: Every day | ORAL | Status: DC
  Administered 2024-02-11: 1 via ORAL
  Filled 2024-02-10 (×6): qty 1

## 2024-02-10 MED ORDER — EPHEDRINE 5 MG/ML INJ
INTRAVENOUS | Status: AC
  Filled 2024-02-10: qty 5

## 2024-02-10 MED ORDER — MAGNESIUM HYDROXIDE 400 MG/5ML PO SUSP: 30.0000 mL | Freq: Every day | ORAL | Status: DC | PRN

## 2024-02-10 MED ORDER — BUPIVACAINE HCL (PF) 0.5 % IJ SOLN
INTRAMUSCULAR | Status: AC
  Filled 2024-02-10: qty 30

## 2024-02-10 MED ORDER — THROMBIN 20000 UNITS EX KIT
PACK | CUTANEOUS | Status: AC
  Filled 2024-02-10: qty 1

## 2024-02-10 MED ORDER — BISACODYL 10 MG RE SUPP: 10.0000 mg | Freq: Every day | RECTAL | Status: DC | PRN

## 2024-02-10 MED ORDER — GELATIN ABSORBABLE 12-7 MM EX MISC
CUTANEOUS | Status: AC
  Filled 2024-02-10: qty 1

## 2024-02-10 MED ORDER — ONDANSETRON HCL 4 MG/2ML IJ SOLN
4.0000 mg | Freq: Four times a day (QID) | INTRAMUSCULAR | Status: DC | PRN
Start: 2024-02-10 — End: 2024-02-13

## 2024-02-10 MED ORDER — ROCURONIUM BROMIDE 100 MG/10ML IV SOLN
INTRAVENOUS | Status: DC | PRN
  Administered 2024-02-10: 10 mg via INTRAVENOUS
  Administered 2024-02-10: 50 mg via INTRAVENOUS
  Administered 2024-02-10: 10 mg via INTRAVENOUS

## 2024-02-10 MED ORDER — DEXAMETHASONE SODIUM PHOSPHATE 10 MG/ML IJ SOLN
INTRAMUSCULAR | Status: AC
  Filled 2024-02-10: qty 1

## 2024-02-10 MED ORDER — FENTANYL CITRATE (PF) 100 MCG/2ML IJ SOLN
INTRAMUSCULAR | Status: AC
  Filled 2024-02-10: qty 2

## 2024-02-10 MED ORDER — LIDOCAINE HCL (PF) 2 % IJ SOLN
INTRAMUSCULAR | Status: AC
Start: 2024-02-10 — End: ?
  Filled 2024-02-10: qty 5

## 2024-02-10 MED ORDER — CARBIDOPA-LEVODOPA ER 25-100 MG PO TBCR
2.0000 | EXTENDED_RELEASE_TABLET | Freq: Four times a day (QID) | ORAL | Status: DC
  Filled 2024-02-10 (×2): qty 2

## 2024-02-10 MED ORDER — LACTATED RINGERS IV SOLN: INTRAVENOUS | Status: DC | PRN

## 2024-02-10 MED ORDER — FENTANYL CITRATE (PF) 100 MCG/2ML IJ SOLN: 25.0000 ug | INTRAMUSCULAR | Status: DC | PRN

## 2024-02-10 MED ORDER — ONDANSETRON HCL 4 MG PO TABS
4.0000 mg | ORAL_TABLET | Freq: Four times a day (QID) | ORAL | Status: DC | PRN
Start: 2024-02-10 — End: 2024-02-13

## 2024-02-10 MED ORDER — GELATIN ABSORBABLE 100 CM EX MISC
CUTANEOUS | Status: AC
  Filled 2024-02-10: qty 1

## 2024-02-10 MED ORDER — ALBUMIN HUMAN 5 % IV SOLN: INTRAVENOUS | Status: DC | PRN

## 2024-02-10 MED ORDER — STERILE WATER FOR IRRIGATION IR SOLN
Status: DC | PRN
  Administered 2024-02-10: 1000 mL

## 2024-02-10 MED ORDER — PROPOFOL 10 MG/ML IV BOLUS
INTRAVENOUS | Status: AC
Start: 2024-02-10 — End: ?
  Filled 2024-02-10: qty 20

## 2024-02-10 MED ORDER — SEVOFLURANE IN SOLN
RESPIRATORY_TRACT | Status: AC
  Filled 2024-02-10: qty 250

## 2024-02-10 MED ORDER — FLEET ENEMA RE ENEM: 1.0000 | ENEMA | Freq: Once | RECTAL | Status: DC | PRN

## 2024-02-10 MED ORDER — AMIODARONE HCL 200 MG PO TABS
100.0000 mg | ORAL_TABLET | Freq: Every day | ORAL | Status: DC
  Administered 2024-02-12 – 2024-02-13 (×2): 100 mg via ORAL
  Filled 2024-02-10 (×4): qty 1

## 2024-02-10 MED ORDER — BUPIVACAINE-EPINEPHRINE 0.5% -1:200000 IJ SOLN
INTRAMUSCULAR | Status: DC | PRN
  Administered 2024-02-10: 30 mL via SURGICAL_CAVITY

## 2024-02-10 MED ORDER — BUPIVACAINE LIPOSOME 1.3 % IJ SUSP
INTRAMUSCULAR | Status: AC
  Filled 2024-02-10: qty 20

## 2024-02-10 SURGICAL SUPPLY — 43 items
BIT DRILL 4.3MMS DISTAL GRDTED (BIT) IMPLANT
BNDG COHESIVE 4X5 TAN STRL LF (GAUZE/BANDAGES/DRESSINGS) ×2 IMPLANT
BNDG COHESIVE 6X5 TAN ST LF (GAUZE/BANDAGES/DRESSINGS) ×2 IMPLANT
CHLORAPREP W/TINT 26 (MISCELLANEOUS) ×4 IMPLANT
DRAPE C-ARMOR (DRAPES) ×2 IMPLANT
DRAPE SHEET LG 3/4 BI-LAMINATE (DRAPES) ×2 IMPLANT
DRILL 4.3MMS DISTAL GRADUATED (BIT) ×1 IMPLANT
DRSG MEPILEX SACRM 8.7X9.8 (GAUZE/BANDAGES/DRESSINGS) ×2 IMPLANT
DRSG OPSITE POSTOP 3X4 (GAUZE/BANDAGES/DRESSINGS) IMPLANT
DRSG OPSITE POSTOP 4X6 (GAUZE/BANDAGES/DRESSINGS) IMPLANT
ELECT CAUTERY BLADE 6.4 (BLADE) ×2 IMPLANT
ELECT REM PT RETURN 9FT ADLT (ELECTROSURGICAL) ×1 IMPLANT
ELECTRODE REM PT RTRN 9FT ADLT (ELECTROSURGICAL) ×2 IMPLANT
GAUZE SPONGE 4X4 12PLY STRL (GAUZE/BANDAGES/DRESSINGS) ×2 IMPLANT
GLOVE BIO SURGEON STRL SZ8 (GLOVE) ×4 IMPLANT
GLOVE INDICATOR 8.0 STRL GRN (GLOVE) ×2 IMPLANT
GOWN STRL REUS W/ TWL LRG LVL3 (GOWN DISPOSABLE) ×2 IMPLANT
GOWN STRL REUS W/ TWL XL LVL3 (GOWN DISPOSABLE) ×2 IMPLANT
GUIDEPIN VERSANAIL DSP 3.2X444 (ORTHOPEDIC DISPOSABLE SUPPLIES) IMPLANT
GUIDEWIRE BALL NOSE 100CM (WIRE) IMPLANT
HANDLE YANKAUER SUCT OPEN TIP (MISCELLANEOUS) ×2 IMPLANT
HFN RH 130 DEG 11MM X 360MM (Orthopedic Implant) IMPLANT
MANIFOLD NEPTUNE II (INSTRUMENTS) ×2 IMPLANT
MAT ABSORB FLUID 56X50 GRAY (MISCELLANEOUS) ×2 IMPLANT
NDL FILTER BLUNT 18X1 1/2 (NEEDLE) ×2 IMPLANT
NDL HYPO 22X1.5 SAFETY MO (MISCELLANEOUS) ×2 IMPLANT
NEEDLE FILTER BLUNT 18X1 1/2 (NEEDLE) ×1 IMPLANT
NEEDLE HYPO 22X1.5 SAFETY MO (MISCELLANEOUS) ×1 IMPLANT
NS IRRIG 500ML POUR BTL (IV SOLUTION) ×2 IMPLANT
PACK HIP COMPR (MISCELLANEOUS) ×2 IMPLANT
PENCIL SMOKE EVACUATOR (MISCELLANEOUS) ×2 IMPLANT
SCREW BONE CORTICAL 5.0X42 (Screw) IMPLANT
SCREW CANN THRD AFF 10.5X100 (Screw) IMPLANT
STAPLER SKIN PROX 35W (STAPLE) ×2 IMPLANT
STRAP SAFETY 5IN WIDE (MISCELLANEOUS) ×2 IMPLANT
SUT VIC AB 0 CT1 36 (SUTURE) ×2 IMPLANT
SUT VIC AB 1 CT1 36 (SUTURE) ×2 IMPLANT
SUT VIC AB 2-0 CT1 (SUTURE) ×4 IMPLANT
SYR 10ML LL (SYRINGE) ×2 IMPLANT
SYR 30ML LL (SYRINGE) ×2 IMPLANT
TAPE MICROFOAM 4IN (TAPE) ×2 IMPLANT
TRAP FLUID SMOKE EVACUATOR (MISCELLANEOUS) ×2 IMPLANT
WATER STERILE IRR 500ML POUR (IV SOLUTION) ×2 IMPLANT

## 2024-02-10 NOTE — ED Notes (Signed)
 Upon assessment pt eyes are closed, rise and fall of chest noted and respirations WDL. CB within reach.

## 2024-02-10 NOTE — Transfer of Care (Signed)
 Immediate Anesthesia Transfer of Care Note  Patient: Arvella Merles  Procedure(s) Performed: FIXATION, FRACTURE, INTERTROCHANTERIC, WITH INTRAMEDULLARY ROD (Right: Hip)  Patient Location: PACU  Anesthesia Type:General  Level of Consciousness: lethargic and responds to stimulation  Airway & Oxygen Therapy: Patient Spontanous Breathing and Patient connected to face mask oxygen  Post-op Assessment: Report given to RN and Post -op Vital signs reviewed and stable  Post vital signs: Reviewed and stable  Last Vitals:  Vitals Value Taken Time  BP 119/65 02/10/24 1500  Temp 36.8 C 02/10/24 1500  Pulse 63 02/10/24 1504  Resp 19 02/10/24 1504  SpO2 95 % 02/10/24 1504  Vitals shown include unfiled device data.  Last Pain:  Vitals:   02/10/24 1155  TempSrc: Temporal  PainSc:          Complications: No notable events documented.

## 2024-02-10 NOTE — Consult Note (Signed)
 ORTHOPAEDIC CONSULTATION  REQUESTING PHYSICIAN: Delfino Lovett, MD  Chief Complaint:   Right hip and wrist pain status post fall.  History of Present Illness: Crystal Bass is an 83 y.o. female with multiple medical problems including coronary artery disease, congestive heart failure, mitral regurg, COPD, hypertension, history of breast cancer, osteoporosis, and Parkinson's disease who normally lives independently.  The patient lives at [facility] in an assisted living apartment.  She was in her usual state of health yesterday afternoon when she apparently went to reach for something on a coffee table, lost her balance, and fell onto her right side, injuring her right hip and right wrist.  She was brought to the emergency room where x-rays demonstrated a displaced intertrochanteric fracture of the right hip, as well as a minimally impacted right distal radius fracture.  The patient denies any associated injuries.  She did not strike her head or lose consciousness.  The patient also denies any lightheadedness, dizziness, chest pain, shortness of breath, or other symptoms which may have precipitated her fall.  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 10/2021 Cath: Nl cors.   Arthritis    Asthma    Breast cancer (HCC) 2002   infiltrative ductal carcinoma    Cataract 2019   Chronic heart failure with preserved ejection fraction (HFpEF) (HCC)    COPD (chronic obstructive pulmonary disease) (HCC) 07/2007   Coronary artery disease    Ductal carcinoma of breast, estrogen receptor positive, stage 1 (HCC) 09/16/2012   Endometriosis    GERD (gastroesophageal reflux disease)    Hx of mitral valve repair    Hypercalcemia 09/14/2013   Hypertension    Lesion of breast 1992   right, benign   Lung disease    secondary to [name] infection   Mastalgia  05/1994   Mitral regurgitation    a. 10/2021 RHC: PCWP V wave of 34; b. 01/2022 TEE: Severe flail/prolapse of large area of A2 scallop with severe posteriorly directed MR; c. 01/2022 TEER w /Mitraclip XTW x 2; d. 07/2022 Echo: EF 60-65%. No rwma, mild LVH, GrIII DD. Sev dil LA. Mod elev PASP. Mod-Sev MR. Mod MS.   Moderate mitral regurgitation by prior echocardiogram    Osteoporosis, post-menopausal 03/14/2012   Ovarian tumor of borderline malignancy, right 2004   Palpitations    a. 02/2022 Zio: Predominantly sinus rhythm at 67 (52-171).  2 runs of NSVT -fastest 9 beats at 167.  35 SVT runs-longest 19 beats, fastest 171x6 beats.   Pancreatitis    secondary to Cholelithiasis   Parkinson disease (HCC) 02/10/2015   Post-thoracotomy pain syndrome    Pseudomonas pneumonia (HCC) 03/2008   Status post implantation of mitral valve leaflet clip 02/15/2022   s/p XTW MitraClip x2 by Dr. Excell Seltzer in the A2/P2: b. 04/03/23 s/p third mitraclip implantation.   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   APPENDECTOMY  2004   BREAST BIOPSY  08/2004   right breast-benign   BREAST LUMPECTOMY  1992   benign   BUBBLE STUDY  12/27/2021   Procedure: BUBBLE STUDY;  Surgeon:  Sande Rives, MD;  Location: [facility] ENDOSCOPY;  Service: Cardiovascular;;   CARDIAC CATHETERIZATION  2007   essentially negation for significant CAD   CARDIOVERSION N/A 09/05/2023   Procedure: CARDIOVERSION;  Surgeon: Debbe Odea, MD;  Location: [facility] ORS;  Service: Cardiovascular;  Laterality: N/A;   CATARACT EXTRACTION W/PHACO Left 07/24/2023   Procedure: CATARACT EXTRACTION PHACO AND INTRAOCULAR LENS PLACEMENT (IOC) LEFT 5.34 0037.3;  Surgeon: Lockie Mola, MD;  Location:  [facility] SURGERY CNTR;  Service: Ophthalmology;  Laterality: Left;   CATARACT EXTRACTION W/PHACO Right 08/07/2023   Procedure: CATARACT EXTRACTION PHACO AND INTRAOCULAR LENS PLACEMENT (IOC) RIGHT 7.83 00:47.9;  Surgeon: Lockie Mola, MD;  Location: [facility] SURGERY CNTR;  Service: Ophthalmology;  Laterality: Right;   CHOLECYSTECTOMY  2001   COLONOSCOPY  2014   Dr Marina Goodell , due 2019   COLONOSCOPY  09/2018   2 TA removed, int hem, no f/u needed (Dr Marina Goodell)    ESOPHAGOGASTRODUODENOSCOPY  09/2018   GERD with esophagitis and stricture dilated, antral gastritis negative for H pylori Marina Goodell)   HIP ARTHROPLASTY Left 08/21/2022   Procedure: ARTHROPLASTY BIPOLAR HIP (HEMIARTHROPLASTY);  Surgeon: Juanell Fairly, MD;  Location: [facility] ORS;  Service: Orthopedics;  Laterality: Left;   LUNG BIOPSY  2002   [name], Dr Edwyna Shell   MASTECTOMY MODIFIED RADICAL Left 2002   oral chemotheraphy (tamoxifen then Armidex) no radiation, Dr.[name]   RIGHT/LEFT HEART CATH AND CORONARY ANGIOGRAPHY Bilateral 11/14/2021   Procedure: RIGHT/LEFT HEART CATH AND CORONARY ANGIOGRAPHY;  Surgeon: Iran Ouch, MD;  Location: [facility] INVASIVE CV LAB;  Service: Cardiovascular;  Laterality: Bilateral;   TEE WITHOUT CARDIOVERSION N/A 12/29/2018   Procedure: TRANSESOPHAGEAL ECHOCARDIOGRAM (TEE);  Surgeon: Iran Ouch, MD;  Location: [facility] ORS;  Service: Cardiovascular;  Laterality: N/A;   TEE WITHOUT CARDIOVERSION N/A 12/27/2021   Procedure: TRANSESOPHAGEAL ECHOCARDIOGRAM (TEE);  Surgeon: Sande Rives, MD;  Location: [facility] ENDOSCOPY;  Service: Cardiovascular;  Laterality: N/A;   TEE WITHOUT CARDIOVERSION N/A 02/15/2022   Procedure: TRANSESOPHAGEAL ECHOCARDIOGRAM (TEE);  Surgeon: Tonny Bollman, MD;  Location: [facility] INVASIVE CV LAB;  Service: Cardiovascular;  Laterality: N/A;   TEE WITHOUT CARDIOVERSION N/A 05/14/2022   Procedure: TRANSESOPHAGEAL ECHOCARDIOGRAM (TEE);  Surgeon: Sande Rives, MD;  Location: [facility] ENDOSCOPY;   Service: Cardiovascular;  Laterality: N/A;   TEE WITHOUT CARDIOVERSION N/A 04/03/2023   Procedure: TRANSESOPHAGEAL ECHOCARDIOGRAM;  Surgeon: Tonny Bollman, MD;  Location: [facility] INVASIVE CV LAB;  Service: Cardiovascular;  Laterality: N/A;   TEE WITHOUT CARDIOVERSION N/A 09/05/2023   Procedure: TRANSESOPHAGEAL ECHOCARDIOGRAM;  Surgeon: Debbe Odea, MD;  Location: [facility] ORS;  Service: Cardiovascular;  Laterality: N/A;   TONSILLECTOMY  1946   TRANSCATHETER MITRAL EDGE TO EDGE REPAIR N/A 02/15/2022   Procedure: MITRAL VALVE REPAIR;  Surgeon: Tonny Bollman, MD;  Location: [facility] INVASIVE CV LAB;  Service: Cardiovascular;  Laterality: N/A;   TRANSCATHETER MITRAL EDGE TO EDGE REPAIR N/A 04/03/2023   Procedure: MITRAL VALVE REPAIR;  Surgeon: Tonny Bollman, MD;  Location: [facility] INVASIVE CV LAB;  Service: Cardiovascular;  Laterality: N/A;   Social History   Socioeconomic History   Marital status: Widowed    Spouse name: Not on file   Number of children: 0   Years of education: Not on file   Highest education level: Not on file  Occupational History   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, 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, 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, 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  Tobacco Use   Smoking status: Never   Smokeless tobacco: Never  Vaping Use   Vaping status: Never Used  Substance and Sexual Activity  Alcohol use: No    Alcohol/week: 0.0 standard drinks of alcohol   Drug use: No   Sexual activity: Not Currently    Birth control/protection: Surgical    Comment: TAH/BSO  Other Topics Concern   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    Aunt of Dr Gilmore Laroche    Lives independent living at [facility]    No children    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, 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, 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, 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)    Activity: walking about 1 mile/day, enjoys [employer]    Diet: some water, fruits/vegetables some    Right handed    Social Drivers of Corporate investment banker Strain: Low Risk  (08/13/2022)   Overall Financial Resource Strain (CARDIA)     Difficulty of Paying Living Expenses: Not hard at all  Food Insecurity: No Food Insecurity (09/05/2023)   Hunger Vital Sign    Worried About Running Out of Food in the Last Year: Never true    Ran Out of Food in the Last Year: Never true  Transportation Needs: No Transportation Needs (09/05/2023)   PRAPARE - Administrator, Civil Service (Medical): No    Lack of Transportation (Non-Medical): No  Physical Activity: Insufficiently Active (08/13/2022)   Exercise Vital Sign    Days of Exercise per Week: 4 days    Minutes of Exercise per Session: 30 min  Stress: No Stress Concern Present (08/13/2022)   [employer] of Occupational Health - Occupational Stress Questionnaire    Feeling of Stress : Not at all  Social Connections: Socially Isolated (08/13/2022)   Social Connection and Isolation Panel [NHANES]    Frequency of Communication with Friends and Family: More than three times a week    Frequency of Social Gatherings with Friends and Family: Once a week    Attends Religious Services: Never    Database 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 or Organizations: No    Attends Banker Meetings: Never    Marital Status: Widowed   Family History  Problem Relation Age of Onset   Emphysema Mother        d.64 was never a smoker   Lung cancer Father 50       d.82 history of smoking   Breast cancer Sister 68   Colon cancer Sister    Melanoma Maternal Aunt    Liver cancer Maternal Aunt    Cancer Maternal Uncle        unspecified type   Cancer Maternal Uncle        unspecified type   Cancer Maternal Uncle        unspecified type   Stroke Paternal Aunt        in mid 28s   Osteoporosis Paternal Aunt    Myasthenia gravis Paternal Aunt    Cancer Maternal Grandmother        d.82s unspecified GI cancer   Cancer Maternal Grandfather        d.62s unspecified type   Breast cancer Cousin        d.60s-daughter of unaffected paternal aunt Doristine Church   Colon cancer Cousin 60       d.70-daughter  of unaffected maternal aunt Leila   Lung cancer Cousin 70       d.70-sisters to each other, both daughters of maternal uncle Johnny   Parkinson's disease Cousin    Diabetes Neg Hx    Heart disease Neg Hx    Stomach cancer Neg Hx  Ulcerative colitis Neg Hx    Allergies  Allergen Reactions   Sulfa Antibiotics Anaphylaxis   Sulfonamide Derivatives Anaphylaxis   Topamax [Topiramate] Other (See Comments)    Metabolic acidosis    Clarithromycin Other (See Comments)    pericarditis  Other reaction(s): Not available   Hydrocodone Other (See Comments)    "hyper and climbing the walls"   Motrin [Ibuprofen] Other (See Comments)    headaches   Misc. Sulfonamide Containing Compounds     Other reaction(s): Not available   Symbicort [Budesonide-Formoterol Fumarate] Other (See Comments)    02/07/15 tremor   Tetracycline Hcl Other (See Comments)   Percocet [Oxycodone-Acetaminophen] Itching and Rash   Tape Itching and Rash    Use paper tape only   Tetracyclines & Related Other (See Comments)    "immediate yeast infection"   Prior to Admission medications   Medication Sig Start Date End Date Taking? Authorizing Provider  acetaminophen (TYLENOL) 500 MG tablet Take 1,000 mg by mouth 3 (three) times daily.   Yes [provider]  albuterol (VENTOLIN HFA) 108 (90 Base) MCG/ACT inhaler Inhale 2 puffs into the lungs every 6 (six) hours as needed for wheezing or shortness of breath. 06/04/22  Yes Eustaquio Boyden, MD  amiodarone (PACERONE) 100 MG tablet Take 100 mg by mouth daily.   Yes [provider]  apixaban (ELIQUIS) 2.5 MG TABS tablet Take 1 tablet (2.5 mg total) by mouth 2 (two) times daily. 08/27/23  Yes Iran Ouch, MD  calcium carbonate (OSCAL) 1500 (600 Ca) MG TABS tablet Take 600 mg of elemental calcium by mouth daily.   Yes [provider]  calcium carbonate (TUMS - DOSED IN MG ELEMENTAL CALCIUM) 500 MG chewable tablet Chew 2 tablets by mouth daily as  needed for indigestion or heartburn.   Yes [provider]  clonazepam (KLONOPIN) 0.125 MG disintegrating tablet Take by mouth at bedtime. 01/15/24  Yes [provider]  clonazePAM (KLONOPIN) 0.5 MG tablet TAKE 0.25 A TABLET = 0.125 mg BY MOUTH AT BEDTIME 01/17/24  Yes Medina-Vargas, Monina C, NP  fluticasone (FLONASE) 50 MCG/ACT nasal spray Place 2 sprays into both nostrils daily as needed. 10/15/23  Yes [provider]  furosemide (LASIX) 40 MG tablet TAKE ONE TABLET BY MOUTH ONCE DAILY *NEW DOSE* 03/29/23  Yes Iran Ouch, MD  magnesium hydroxide (MILK OF MAGNESIA) 400 MG/5ML suspension Take 30 mLs by mouth daily as needed for mild constipation.   Yes [provider]  nitroGLYCERIN (NITROSTAT) 0.4 MG SL tablet Place 0.4 mg under the tongue every 5 (five) minutes as needed for chest pain. 12/30/23  Yes [provider]  pantoprazole (PROTONIX) 20 MG tablet Take 1 tablet (20 mg total) by mouth daily. 04/01/23  Yes Eustaquio Boyden, MD  aluminum-magnesium hydroxide 200-200 MG/5ML suspension Take 10 mLs by mouth every 4 (four) hours as needed for indigestion. Patient not taking: Reported on 02/04/2024    [provider]  bisacodyl (DULCOLAX) 10 MG suppository Place 10 mg rectally as needed for moderate constipation.    [provider]  Carbidopa-Levodopa ER (SINEMET CR) 25-100 MG tablet controlled release 2 at 9am, 2 at 1pm, 1 at 5pm Patient not taking: Reported on 02/10/2024 02/04/24   Tat, Octaviano Batty, DO  Cholecalciferol (VITAMIN D3) 25 MCG (1000 UT) CAPS Take 1 capsule (1,000 Units total) by mouth daily. 09/27/22   Eustaquio Boyden, MD  docusate sodium (COLACE) 100 MG capsule Take 100 mg by mouth 2 (two) times daily.  [provider]  EPINEPHrine 0.3 mg/0.3 mL IJ SOAJ injection Inject 0.3 mg into the muscle as needed for anaphylaxis. 02/25/20   [provider]  fexofenadine (ALLEGRA) 30 MG/5ML suspension Take 5 mLs (30 mg  total) by mouth 2 (two) times daily. 01/20/24   Medina-Vargas, Monina C, NP  Fluticasone-Umeclidin-Vilant (TRELEGY ELLIPTA) 100-62.5-25 MCG/ACT AEPB Inhale 1 puff into the lungs daily. 04/27/22   Cobb, Ruby Cola, NP  gabapentin ([name]) 300 MG capsule Take Two capsules by mouth twice daily. Patient not taking: Reported on 02/10/2024 12/19/23   Earnestine Mealing, MD  polyethylene glycol (MIRALAX / GLYCOLAX) 17 g packet Take 17 g by mouth daily as needed.    [provider]   DG Chest 1 View Result Date: 02/09/2024 CLINICAL DATA:  Larey Seat, pain EXAM: CHEST  1 VIEW COMPARISON:  09/04/2023 FINDINGS: Single frontal view of the chest demonstrates stable enlargement of the cardiac silhouette. Postsurgical changes are seen from mitral valve repair. There is increased consolidation within the medial right upper lung zone since prior study, consistent with airspace disease superimposed upon chronic atelectasis and bronchiectasis seen on prior CTs. Increased pulmonary vascular congestion. No effusion or pneumothorax. No acute bony abnormalities. IMPRESSION: 1. Enlarged cardiac silhouette. 2. Pulmonary vascular congestion. Increased consolidation within the medial right upper lung zone may reflect airspace disease superimposed upon known chronic scarring and bronchiectasis. Electronically Signed   By: Sharlet Salina M.D.   On: 02/09/2024 17:44   DG Hip Unilat W or Wo Pelvis 2-3 Views Right Result Date: 02/09/2024 CLINICAL DATA:  Right hip pain EXAM: DG HIP (WITH OR WITHOUT PELVIS) 2-3V RIGHT COMPARISON:  09/22/2022 FINDINGS: Acute, mildly comminuted intertrochanteric fracture of the proximal right femur with mild displacement and varus angulation. Hip joint alignment is maintained without dislocation. Prior left hip arthroplasty. Bony pelvis intact without evidence of fracture or diastasis. Soft tissue swelling overlies the right hip laterally. IMPRESSION: Acute, mildly comminuted intertrochanteric fracture of  the proximal right femur. Electronically Signed   By: Duanne Guess D.O.   On: 02/09/2024 17:43   DG Wrist Complete Right Result Date: 02/09/2024 CLINICAL DATA:  Larey Seat, right wrist pain EXAM: RIGHT WRIST - COMPLETE 3+ VIEW COMPARISON:  None Available. FINDINGS: Frontal, oblique, lateral, and ulnar deviated views of the right wrist are obtained. There is an impacted extra-articular fracture through the distal right radial metaphysis, with mild impaction at the fracture site. Anatomic alignment of the radiocarpal joint. Prominent osteoarthritis within the radial aspect of the carpus and at the first carpometacarpal joint. Mild soft tissue swelling. IMPRESSION: 1. Impacted extra-articular fracture of the distal radial metaphysis, with near anatomic alignment. 2. 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 3. Soft tissue swelling. Electronically Signed   By: Sharlet Salina M.D.   On: 02/09/2024 17:41    Positive ROS: All other systems have been reviewed and were otherwise negative with the exception of those mentioned in the HPI and as above.  Physical Exam: General:  Alert, no acute distress Psychiatric:  Patient is competent for consent with normal mood and affect   Cardiovascular:  No pedal edema Respiratory:  No wheezing, non-labored breathing GI:  Abdomen is soft and non-tender Skin:  No lesions in the area of chief complaint Neurologic:  Sensation intact distally Lymphatic:  No axillary or cervical lymphadenopathy  Orthopedic Exam:  Orthopedic examination is limited to the right hip and lower extremity.  Skin inspection around the right hip is notable for mild  swelling, but otherwise is unremarkable.  No erythema, ecchymosis, abrasions, or other skin abnormalities are identified.  She has mild to moderate tenderness to palpation over the anterolateral aspect of the hip.  She has more severe pain with any attempted active or passive motion of  the hip.  She is grossly neurovascularly intact to the right lower extremity and foot.  Orthopedic examination of the right wrist and hand is notable for the wrist being in a Velcro wrist immobilizer.  She has moderate swelling in the hand but the skin itself is intact.  She is able to flex and extend all digits, although motion is limited secondary to pain and apprehension.  She is grossly neurovascularly intact all digits.  X-rays:  Recent x-rays of her pelvis and right hip are available for review and have been reviewed by myself.  These films demonstrate a displaced intertrochanteric fracture of the right hip.  The hip joint itself appears to be satisfactorily maintained with at most minimal degenerative changes identified.  No lytic lesions or other acute bony processes are identified.  Recent x-rays of the right wrist also are available for review and have been reviewed by myself.  These films demonstrate a minimally impacted extra-articular fracture of the right distal radius with overall satisfactory maintenance of alignment.  No significant degenerative changes of the radiocarpal joint are noted.  No other acute bony processes are identified.  Assessment: 1.  Closed displaced intertrochanteric fracture right hip. 2.  Minimally impacted extra-articular fracture of right distal radius.  Plan: The treatment options, including both surgical and nonsurgical choices, have been discussed in detail with the patient and her son.  The risks (including bleeding, infection, nerve and/or blood vessel injury, persistent or recurrent pain, loosening or failure of the components, leg length inequality, dislocation, need for further surgery, blood clots, strokes, heart attacks or arrhythmias, pneumonia, etc.) and benefits of the surgical procedure were discussed.  The patient states his/her understanding and agrees to proceed.  A formal written consent will be obtained by the nursing staff.  Thank you for  asking me to participate in the care of this most pleasant and unfortunate woman.  I will be happy to follow her with you.   Maryagnes Amos, MD  Beeper #:  [phone]  02/10/2024 7:18 AM

## 2024-02-10 NOTE — Anesthesia Postprocedure Evaluation (Signed)
 Anesthesia Post Note  Patient: JOBINA MAITA  Procedure(s) Performed: FIXATION, FRACTURE, INTERTROCHANTERIC, WITH INTRAMEDULLARY ROD (Right: Hip)  Patient location during evaluation: PACU Anesthesia Type: General Level of consciousness: awake and alert Pain management: pain level controlled Vital Signs Assessment: post-procedure vital signs reviewed and stable Respiratory status: spontaneous breathing, nonlabored ventilation, respiratory function stable and patient connected to nasal cannula oxygen Cardiovascular status: blood pressure returned to baseline and stable Postop Assessment: no apparent nausea or vomiting Anesthetic complications: no  No notable events documented.   Last Vitals:  Vitals:   02/10/24 1515 02/10/24 1530  BP: 111/65 (!) 112/58  Pulse: 64 64  Resp: 18 18  Temp:    SpO2: 95% 96%    Last Pain:  Vitals:   02/10/24 1530  TempSrc:   PainSc: Asleep                 Stephanie Coup

## 2024-02-10 NOTE — Progress Notes (Signed)
 PT Cancellation Note  Patient Details Name: SERIN THORNELL MRN: [id number] DOB: 1941/06/14   Cancelled Treatment:    Reason Eval/Treat Not Completed: Medical issues which prohibited therapy (Consult received and chart reviewed. Patient noted with acute hip fracture, pending surgical repair this date. Will require new orders to initiate therapy post-op; please reconsult as appropriate.)   Paula Zietz H. Manson Passey, PT, DPT, NCS 02/10/24, 10:02 AM [phone]

## 2024-02-10 NOTE — ED Notes (Signed)
 Pt transported to  OR via bed

## 2024-02-10 NOTE — ED Notes (Signed)
 Dr. Para March at bedside

## 2024-02-10 NOTE — Anesthesia Preprocedure Evaluation (Signed)
 Anesthesia Evaluation  Patient identified by MRN, date of birth, ID band Patient awake  General Assessment Comment:Prior difficult airway with DL, however straightforward intubation with video laryngoscopy.  Reviewed: Allergy & Precautions, H&P , NPO status , Patient's Chart, lab work & pertinent test results  Airway Mallampati: III  TM Distance: >3 FB Neck ROM: Limited    Dental  (+) Caps   Pulmonary asthma , pneumonia, COPD   Pulmonary exam normal breath sounds clear to auscultation       Cardiovascular hypertension, + angina  + CAD and +CHF  + dysrhythmias Atrial Fibrillation + Valvular Problems/Murmurs MR  Rhythm:Regular Rate:Normal  04-03-23 Successful TEER of the mitral valve with a MitraClip G4 XTW device, positioned A2/P2, lateral to the indwelling Clip devices   MR reduced from 4+ to 2+ post-procedure   05-15-23 1. Left ventricular ejection fraction, by estimation, is 60 to 65%. The  left ventricle has normal function. The left ventricle has no regional  wall motion abnormalities. There is mild left ventricular hypertrophy.  Left ventricular diastolic parameters  are indeterminate.   2. Right ventricular systolic function is normal. The right ventricular  size is mildly enlarged. There is mildly elevated pulmonary artery  systolic pressure. The estimated right ventricular systolic pressure is  38.5 mmHg.   3. The mitral valve has been repaired/replaced. There is a Mitra-Clip  present in the mitral position. The average mean gradient is at HR  56bpm. There is moderate mitral regurgitation that appears slightly more  significant compared to prior TTE  best appreciated on SAX views.   4. Left atrial size was severely dilated.   5. An iatrogenic ASD is present with left to right shunting. Evidence of  atrial level shunting detected by color flow Doppler.   6. Right atrial size was moderately dilated.   7. The  aortic valve is tricuspid. There is mild calcification of the  aortic valve. There is mild thickening of the aortic valve. Aortic valve  regurgitation is not visualized. Aortic valve sclerosis/calcification is  present, without any evidence of  aortic stenosis.   8. Aortic dilatation noted. There is mild dilatation of the ascending  aorta, measuring 41 mm.   9. The inferior vena cava is normal in size with greater than 50%  respiratory variability, suggesting right atrial pressure of 3 mmHg.   Now has moderate MR    Neuro/Psych Parkinson's disease  Neuromuscular disease  negative psych ROS   GI/Hepatic Neg liver ROS,GERD  ,,  Endo/Other  negative endocrine ROS    Renal/GU      Musculoskeletal   Abdominal   Peds  Hematology negative hematology ROS (+)   Anesthesia Other Findings Pancreatitis Post-thoracotomy pain syndrome Endometriosis  Aneurysm of aorta (HCC) Osteoporosis, post-menopausal  Ductal carcinoma of breast, estrogen receptor positive, stage 1 (HCC) Pseudomonas pneumonia (HCC)  COPD (chronic obstructive pulmonary disease)  Mastalgia  Hypercalcemia  Vitamin D deficiency Parkinson disease  Breast cancer (HCC) Hypertension  Arthritis Asthma  GERD (gastroesophageal reflux disease) Cataract  Anginal pain (HCC) Status post implantation of mitral valve leaflet clip  Chronic heart failure with preserved ejection fraction (HFpEF) (HCC) Mitral regurgitation  Palpitations Coronary artery disease     Reproductive/Obstetrics negative OB ROS                             Anesthesia Physical Anesthesia Plan  ASA: 3 and emergent  Anesthesia Plan: General ETT   Post-op  Pain Management:    Induction: Intravenous  PONV Risk Score and Plan: 3 and Ondansetron and Dexamethasone  Airway Management Planned: Oral ETT  Additional Equipment:   Intra-op Plan:   Post-operative Plan: Extubation in OR  Informed Consent: I have  reviewed the patients History and Physical, chart, labs and discussed the procedure including the risks, benefits and alternatives for the proposed anesthesia with the patient or authorized representative who has indicated his/her understanding and acceptance.     Dental Advisory Given and Consent reviewed with POA  Plan Discussed with: Anesthesiologist, CRNA and Surgeon  Anesthesia Plan Comments: (Patient's brother consented for risks of anesthesia including but not limited to:  - adverse reactions to medications - damage to eyes, teeth, lips or other oral mucosa - nerve damage due to positioning  - sore throat or hoarseness - Damage to heart, brain, nerves, lungs, other parts of body or loss of life  Patient's brother voiced understanding and assent.)       Anesthesia Quick Evaluation

## 2024-02-10 NOTE — Progress Notes (Addendum)
 1      PROGRESS NOTE    Crystal Bass  YQM:[id number] DOB: 11/06/1941 DOA: 02/09/2024 PCP: Earnestine Mealing, MD    Brief Narrative:   83 y.o. female with medical history significant for Parkinson's disease, COPD/bronchiectasis, mitral regurgitation s/p MitraClip in 2023 with redo 03/2023, diastolic CHF history of PE in 5284, A-fib in October s/p cardioversion, on Eliquis and amiodarone  being admitted with an accidental fall fall in which she sustained a right intertrochanteric fracture and a buckle fracture of the right distal radius.  Dr. Joice Lofts is aware and will take patient to the OR on 3/17   3/17: Cardiology has cleared her for surgery.  Ortho planning for surgery later today, palliative care consult for goals of care   Assessment & Plan:   Principal Problem:   Closed right hip fracture, initial encounter [facility]) Active Problems:   Chronic heart failure with preserved ejection fraction (HFpEF) (HCC)   Atrial fibrillation s/p cardioversion 08/2023 [facility])   Elevated troponin   History of pulmonary embolism 2019   Closed right radial fracture  * Closed right hip fracture, initial encounter (HCC) Secondary to accidental fall Holding Eliquis N.p.o. for planned surgery today DVT prophylaxis and pain management per orthopedics   Atrial fibrillation s/p cardioversion 08/2023 (HCC) Holding Eliquis for surgery Continue amiodarone   Chronic heart failure with preserved ejection fraction (HFpEF) (HCC) Chest x-ray suggesting pulmonary vascular congestion, patient appears clinically overloaded EF 50 to 55% on TEE 08/2023 with indeterminate diastolic parameters Cardiology has seen and cleared her for planned surgery.-->trop 87, BNP 467, EKG with NSR at 66 with biventricular hypertrophy Status post 1 dose of Lasix on admission.  She is at moderate risk. respiratory viral panel negative.  Procalcitonin was less than 0.1 Daily weights   Elevated troponin Troponin 87-152 likely due to  demand ischemia Patient denies chest pain and EKG with no ischemic changes Echocardiogram shows hyperdynamic LV systolic function, moderate pulmonary hypertension and moderate mitral regurgitation   Closed right radial fracture Buckle fracture.  Manage per orthopedics   COPD (chronic obstructive pulmonary disease) (HCC) Continue home inhalers DuoNebs if needed   History of pulmonary embolism No acute issues suspected.  Resume request.  Once safe from bleeding standpoint and okay with orthopedics   S/P mitral valve clip implantation S/p MitraClip's x 2 due to inadequate symptomatic improvement after first MitraClip in 2023 No acute issues suspected cardiologist following   Parkinson disease Continue Sinemet.   Frailty PT eval Ambulates with rollator/walker at baseline .  Had prior left hip fracture     DVT prophylaxis: SCDs SCDs Start: 02/09/24 2133     Code Status: DNR Family Communication: "discussed with patient". Updated patient's brother Loraine Leriche and Niece over phone.  Disposition Plan: Possible discharge in next 2 to 3 days depending on clinical condition and postop recovery   Consultants:  Orthopedics  Procedures:  Hip fracture surgery on 3/17    Subjective:  She denies any chest pain.  Minimal pain at the fracture site.  She is intermittently staring off at the ceiling  Objective: Vitals:   02/10/24 0700 02/10/24 0722 02/10/24 0836 02/10/24 1000  BP: (!) 119/59   (!) 171/75  Pulse: 61   65  Resp: 14   18  Temp:   98 F (36.7 C)   TempSrc:   Oral   SpO2: 94% 94%  91%  Weight:      Height:        Intake/Output Summary (Last 24 hours)  at 02/10/2024 1132 Last data filed at 02/10/2024 1610 Gross per 24 hour  Intake --  Output 200 ml  Net -200 ml   Filed Weights   02/09/24 1649  Weight: 57 kg    Examination: 83 year old cachectic looking female General exam: Appears calm and comfortable  Respiratory system: Clear to auscultation. Respiratory  effort normal. Cardiovascular system: S1 & S2 heard, RRR. No JVD, murmurs, rubs, gallops or clicks. No pedal edema. Gastrointestinal system: Abdomen is soft, benign Central nervous system: Alert and oriented. No focal neurological deficits. Extremities: Tenderness at the right hip area.  Right wrist in the Velcro wrist immobilizer Skin: No rashes, lesions or ulcers Psychiatry: Judgement and insight appear normal. Mood & affect appropriate.     Data Reviewed: I have personally reviewed following labs and imaging studies  CBC: Recent Labs  Lab 02/09/24 1654 02/10/24 0855  WBC 6.5 11.1*  NEUTROABS 4.3  --   HGB 12.9 12.6  HCT 39.8 39.2  MCV 99.5 99.2  PLT 158 138*   Basic Metabolic Panel: Recent Labs  Lab 02/09/24 1654 02/10/24 0855  NA 137 141  K 3.9 4.3  CL 98 102  CO2 28 27  GLUCOSE 107* 119*  BUN 19 22  CREATININE 1.06* 0.81  CALCIUM 9.7 9.1   GFR: Estimated Creatinine Clearance: 43.8 mL/min (by C-G formula based on SCr of 0.81 mg/dL).  Coagulation Profile: Recent Labs  Lab 02/09/24 2100  INR 1.2   Cardiac Enzymes: No results for input(s): "CKTOTAL", "CKMB", "CKMBINDEX", "TROPONINI" in the last 168 hours. BNP (last 3 results) Recent Labs    08/05/23 1114  PROBNP 484.0*    Sepsis Labs: Recent Labs  Lab 02/09/24 1654  PROCALCITON <0.10    Recent Results (from the past 240 hours)  Resp panel by RT-PCR (RSV, Flu A&B, Covid) Anterior Nasal Swab     Status: None   Collection Time: 02/09/24  9:00 PM   Specimen: Anterior Nasal Swab  Result Value Ref Range Status   SARS Coronavirus 2 by RT PCR NEGATIVE NEGATIVE Final    Comment: (NOTE) SARS-CoV-2 target nucleic acids are NOT DETECTED.  The SARS-CoV-2 RNA is generally detectable in upper respiratory specimens during the acute phase of infection. The lowest concentration of SARS-CoV-2 viral copies this assay can detect is 138 copies/mL. A negative result does not preclude SARS-Cov-2 infection and  should not be used as the sole basis for treatment or other patient management decisions. A negative result may occur with  improper specimen collection/handling, submission of specimen other than nasopharyngeal swab, presence of viral mutation(s) within the areas targeted by this assay, and inadequate number of viral copies(<138 copies/mL). A negative result must be combined with clinical observations, patient history, and epidemiological information. The expected result is Negative.  Fact Sheet for Patients:  BloggerCourse.com  Fact Sheet for Healthcare Providers:  SeriousBroker.it  This test is no t yet approved or cleared by the [place] FDA and  has been authorized for detection and/or diagnosis of SARS-CoV-2 by FDA under an Emergency Use Authorization (EUA). This EUA will remain  in effect (meaning this test can be used) for the duration of the COVID-19 declaration under Section 564(b)(1) of the Act, 21 U.S.C.section 360bbb-3(b)(1), unless the authorization is terminated  or revoked sooner.       Influenza A by PCR NEGATIVE NEGATIVE Final   Influenza B by PCR NEGATIVE NEGATIVE Final    Comment: (NOTE) The Xpert Xpress SARS-CoV-2/FLU/RSV plus assay is intended as an aid  in the diagnosis of influenza from Nasopharyngeal swab specimens and should not be used as a sole basis for treatment. Nasal washings and aspirates are unacceptable for Xpert Xpress SARS-CoV-2/FLU/RSV testing.  Fact Sheet for Patients: BloggerCourse.com  Fact Sheet for Healthcare Providers: SeriousBroker.it  This test is not yet approved or cleared by the [place] FDA and has been authorized for detection and/or diagnosis of SARS-CoV-2 by FDA under an Emergency Use Authorization (EUA). This EUA will remain in effect (meaning this test can be used) for the duration of the COVID-19 declaration  under Section 564(b)(1) of the Act, 21 U.S.C. section 360bbb-3(b)(1), unless the authorization is terminated or revoked.     Resp Syncytial Virus by PCR NEGATIVE NEGATIVE Final    Comment: (NOTE) Fact Sheet for Patients: BloggerCourse.com  Fact Sheet for Healthcare Providers: SeriousBroker.it  This test is not yet approved or cleared by the [place] FDA and has been authorized for detection and/or diagnosis of SARS-CoV-2 by FDA under an Emergency Use Authorization (EUA). This EUA will remain in effect (meaning this test can be used) for the duration of the COVID-19 declaration under Section 564(b)(1) of the Act, 21 U.S.C. section 360bbb-3(b)(1), unless the authorization is terminated or revoked.  Performed at [facility], [address], [place], [place] [address]          Radiology Studies: ECHOCARDIOGRAM COMPLETE Result Date: 02/10/2024    ECHOCARDIOGRAM REPORT   Patient Name:   Crystal Bass Date of Exam: 02/10/2024 Medical Rec #:  [id number]         Height:       62.0 in Accession #:    [id number]        Weight:       125.7 lb Date of Birth:  1941-06-24         BSA:          1.569 m Patient Age:    82 years          BP:           119/59 mmHg Patient Gender: F                 HR:           61 bpm. Exam Location:  [facility] Procedure: 2D Echo, Cardiac Doppler and Color Doppler (Both Spectral and Color            Flow Doppler were utilized during procedure). Indications:     Mitral valve disorder I05.9  History:         Patient has prior history of Echocardiogram examinations, most                  recent 09/05/2023. COPD; Risk Factors:Hypertension. History of                  mitral valve repair.                   Mitral Valve: Mitra-Clip valve is present in the mitral                  position.  Sonographer:     Cristela Blue Referring Phys:  [id number] CADENCE H FURTH Diagnosing Phys: Lorine Bears MD IMPRESSIONS  1. Left  ventricular ejection fraction, by estimation, is 70 to 75%. The left ventricle has hyperdynamic function. The left ventricle has no regional wall motion abnormalities. There is moderate asymmetric left ventricular hypertrophy of the septal segment. Left ventricular diastolic parameters  are indeterminate.  2. Right ventricular systolic function is normal. The right ventricular size is normal. There is moderately elevated pulmonary artery systolic pressure.  3. Left atrial size was moderately dilated.  4. The mitral valve has been repaired/replaced. Moderate mitral valve regurgitation. Mild mitral stenosis. The mean mitral valve gradient is 6.0 mmHg. There is a Mitra-Clip present in the mitral position.  5. The aortic valve is normal in structure. Aortic valve regurgitation is not visualized. No aortic stenosis is present.  6. Moderately dilated pulmonary artery.  7. The inferior vena cava is normal in size with greater than 50% respiratory variability, suggesting right atrial pressure of 3 mmHg. FINDINGS  Left Ventricle: Left ventricular ejection fraction, by estimation, is 70 to 75%. The left ventricle has hyperdynamic function. The left ventricle has no regional wall motion abnormalities. The left ventricular internal cavity size was normal in size. There is moderate asymmetric left ventricular hypertrophy of the septal segment. Left ventricular diastolic parameters are indeterminate. Right Ventricle: The right ventricular size is normal. No increase in right ventricular wall thickness. Right ventricular systolic function is normal. There is moderately elevated pulmonary artery systolic pressure. The tricuspid regurgitant velocity is 3.34 m/s, and with an assumed right atrial pressure of 3 mmHg, the estimated right ventricular systolic pressure is 47.6 mmHg. Left Atrium: Left atrial size was moderately dilated. Right Atrium: Right atrial size was normal in size. Pericardium: There is no evidence of pericardial  effusion. Mitral Valve: The mitral valve has been repaired/replaced. Moderate mitral valve regurgitation. There is a Mitra-Clip present in the mitral position. Mild mitral valve stenosis. MV peak gradient, 13.1 mmHg. The mean mitral valve gradient is 6.0 mmHg. Tricuspid Valve: The tricuspid valve is normal in structure. Tricuspid valve regurgitation is mild . No evidence of tricuspid stenosis. Aortic Valve: The aortic valve is normal in structure. Aortic valve regurgitation is not visualized. No aortic stenosis is present. Aortic valve mean gradient measures 3.0 mmHg. Aortic valve peak gradient measures 5.2 mmHg. Aortic valve area, by VTI measures 4.39 cm. Pulmonic Valve: The pulmonic valve was normal in structure. Pulmonic valve regurgitation is trivial. No evidence of pulmonic stenosis. Aorta: The aortic root is normal in size and structure. Pulmonary Artery: The pulmonary artery is moderately dilated. Venous: The inferior vena cava is normal in size with greater than 50% respiratory variability, suggesting right atrial pressure of 3 mmHg. IAS/Shunts: No atrial level shunt detected by color flow Doppler.  LEFT VENTRICLE PLAX 2D LVIDd:         5.20 cm   Diastology LVIDs:         2.40 cm   LV e' medial:    4.79 cm/s LV PW:         1.10 cm   LV E/e' medial:  34.0 LV IVS:        1.70 cm   LV e' lateral:   6.64 cm/s LVOT diam:     2.55 cm   LV E/e' lateral: 24.5 LV SV:         76 LV SV Index:   48 LVOT Area:     5.11 cm  RIGHT VENTRICLE RV Basal diam:  2.50 cm RV Mid diam:    3.20 cm RV S prime:     11.30 cm/s TAPSE (M-mode): 1.6 cm LEFT ATRIUM             Index        RIGHT ATRIUM  Index LA diam:        4.40 cm 2.80 cm/m   RA Area:     13.90 cm LA Vol (A2C):   61.1 ml 38.94 ml/m  RA Volume:   36.10 ml  23.01 ml/m LA Vol (A4C):   64.8 ml 41.30 ml/m LA Biplane Vol: 64.3 ml 40.98 ml/m  AORTIC VALVE AV Area (Vmax):    3.36 cm AV Area (Vmean):   3.85 cm AV Area (VTI):     4.39 cm AV Vmax:            114.00 cm/s AV Vmean:          72.400 cm/s AV VTI:            0.172 m AV Peak Grad:      5.2 mmHg AV Mean Grad:      3.0 mmHg LVOT Vmax:         75.00 cm/s LVOT Vmean:        54.600 cm/s LVOT VTI:          0.148 m LVOT/AV VTI ratio: 0.86  AORTA Ao Root diam: 3.30 cm MITRAL VALVE                TRICUSPID VALVE MV Area (PHT): 1.65 cm     TR Peak grad:   44.6 mmHg MV Area VTI:   1.24 cm     TR Vmax:        334.00 cm/s MV Peak grad:  13.1 mmHg MV Mean grad:  6.0 mmHg     SHUNTS MV Vmax:       1.81 m/s     Systemic VTI:  0.15 m MV Vmean:      111.0 cm/s   Systemic Diam: 2.55 cm MV Decel Time: 460 msec MV E velocity: 163.00 cm/s MV A velocity: 88.60 cm/s MV E/A ratio:  1.84 Lorine Bears MD Electronically signed by Lorine Bears MD Signature Date/Time: 02/10/2024/9:07:50 AM    Final    DG Chest 1 View Result Date: 02/09/2024 CLINICAL DATA:  Larey Seat, pain EXAM: CHEST  1 VIEW COMPARISON:  09/04/2023 FINDINGS: Single frontal view of the chest demonstrates stable enlargement of the cardiac silhouette. Postsurgical changes are seen from mitral valve repair. There is increased consolidation within the medial right upper lung zone since prior study, consistent with airspace disease superimposed upon chronic atelectasis and bronchiectasis seen on prior CTs. Increased pulmonary vascular congestion. No effusion or pneumothorax. No acute bony abnormalities. IMPRESSION: 1. Enlarged cardiac silhouette. 2. Pulmonary vascular congestion. Increased consolidation within the medial right upper lung zone may reflect airspace disease superimposed upon known chronic scarring and bronchiectasis. Electronically Signed   By: Sharlet Salina M.D.   On: 02/09/2024 17:44   DG Hip Unilat W or Wo Pelvis 2-3 Views Right Result Date: 02/09/2024 CLINICAL DATA:  Right hip pain EXAM: DG HIP (WITH OR WITHOUT PELVIS) 2-3V RIGHT COMPARISON:  09/22/2022 FINDINGS: Acute, mildly comminuted intertrochanteric fracture of the proximal right femur with mild  displacement and varus angulation. Hip joint alignment is maintained without dislocation. Prior left hip arthroplasty. Bony pelvis intact without evidence of fracture or diastasis. Soft tissue swelling overlies the right hip laterally. IMPRESSION: Acute, mildly comminuted intertrochanteric fracture of the proximal right femur. Electronically Signed   By: Duanne Guess D.O.   On: 02/09/2024 17:43   DG Wrist Complete Right Result Date: 02/09/2024 CLINICAL DATA:  Larey Seat, right wrist pain EXAM: RIGHT WRIST - COMPLETE 3+ VIEW COMPARISON:  None Available.  FINDINGS: Frontal, oblique, lateral, and ulnar deviated views of the right wrist are obtained. There is an impacted extra-articular fracture through the distal right radial metaphysis, with mild impaction at the fracture site. Anatomic alignment of the radiocarpal joint. Prominent osteoarthritis within the radial aspect of the carpus and at the first carpometacarpal joint. Mild soft tissue swelling. IMPRESSION: 1. Impacted extra-articular fracture of the distal radial metaphysis, with near anatomic alignment. 2. 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 3. Soft tissue swelling. Electronically Signed   By: Sharlet Salina M.D.   On: 02/09/2024 17:41        Scheduled Meds:  amiodarone  100 mg Oral Daily   Carbidopa-Levodopa ER  1 tablet Oral QAC supper   Carbidopa-Levodopa ER  2 tablet Oral BID   docusate sodium  100 mg Oral BID   fluticasone furoate-vilanterol  1 puff Inhalation Daily   And   umeclidinium bromide  1 puff Inhalation Daily   furosemide  40 mg Oral Daily   mupirocin ointment  1 Application Nasal BID   Continuous Infusions:   ceFAZolin (ANCEF) IV       LOS: 1 day    Time spent: 35 minutes    Delfino Lovett, MD [facility] Pager [phone]  If 7PM-7AM, please contact night-coverage [email or web address]  02/10/2024, 11:32 AM

## 2024-02-10 NOTE — ED Notes (Signed)
 Echo at bedside

## 2024-02-10 NOTE — Consult Note (Signed)
 Cardiology Consultation   Patient ID: Crystal Bass MRN: [id number]; DOB: [birth year]  Admit date: 02/09/2024 Date of Consult: 02/10/2024  PCP:  Crystal Mealing, MD   [facility] Providers Cardiologist:  Crystal Bears, MD   {  Patient Profile:   Crystal Bass is a 83 y.o. female with a hx of mitral valve prolapse and mitral regurgitation status post MitraClip x 3, orthostatic hypotension, COPD, Parkinson's disease who is being seen 02/10/2024 for the evaluation of preop clearance at the request of Dr. Sherryll Bass.  History of Present Illness:   Crystal Bass used to be on blood pressure medications, but these were stopped due to orthostatic hypotension.  She underwent mitral valve clip procedure in March 2023 with MitraClip x 2.  She had minimal improvement of symptoms.  Repeat echo showed severe MR confirmed by TEE.  She underwent a third mitral valve clip placement in May 2024.  Postprocedure echo showed mild to moderate MR.  Patient was seen by Crystal Bass 08/27/2023 and found to be in rapid A-fib.  She was very symptomatic and inpatient treatment was discussed, but patient wanted to be managed as an outpatient.  She was started on metoprolol and low-dose Eliquis.  The patient later presented to the ER 09/04/2023 in rapid A-fib with heart rates in the 140s started on IV diltiazem.  She was also found to have acute heart failure with a BNP of 1724 noted on IV Lasix.  Due to severely dilated left atrium and high risk for recurrent A-fib she was switched to amiodarone drip.  Patient underwent TEE guided cardioversion on 09/05/23 with conversion to sinus rhythm.  TEE showed mild to moderate MR with a mean gradient of 6 mmHg.  The patient was last seen 11-28-23 and was in normal sinus to cardiac.  The patient presented to the ER 02/09/2024 with a fall and right hip pain.  Per chart review, she was reaching for something on the table and lost her balance and fell on her right side.   In the  ER blood pressure 162/98, pulse rate 67, respiratory rate 20, afebrile.  Labs showed blood glucose 107, serum creatinine 1.06.  X-ray of the hip showed right femur fracture. CXR showed pulmonary vascular congestion. right Wrist x ray showed fracture.she was given IVF and started on abx.   HS trop 22>87>152. BNP 467. She was given IV lasix. EKG shows NSR 66bpm, TWI inf leads with ST depression inf/lat leads. LV hypertrophy with repol. Does not look significantly different from prior. On my interview, she is A&Ox1 and somewhat responsive. She denies chest pain.   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 10/2021 Cath: Nl cors.   Arthritis    Asthma    Breast cancer (HCC) 2002   infiltrative ductal carcinoma    Cataract 2019   Chronic heart failure with preserved ejection fraction (HFpEF) (HCC)    COPD (chronic obstructive pulmonary disease) (HCC) 07/2007   Coronary artery disease    Ductal carcinoma of breast, estrogen receptor positive, stage 1 (HCC) 09/16/2012   Endometriosis    GERD (gastroesophageal reflux disease)    Hx of mitral valve repair    Hypercalcemia 09/14/2013   Hypertension    Lesion of breast 1992   right, benign   Lung disease    secondary to [name] infection   Mastalgia  05/1994   Mitral regurgitation    a. 10/2021 RHC: PCWP V wave of 34; b. 01/2022 TEE: Severe flail/prolapse of large area of A2 scallop with severe posteriorly directed MR; c. 01/2022 TEER w /Mitraclip XTW x 2; d. 07/2022 Echo: EF 60-65%. No rwma, mild LVH, GrIII DD. Sev dil LA. Mod elev PASP. Mod-Sev MR. Mod MS.   Moderate mitral regurgitation by prior echocardiogram    Osteoporosis, post-menopausal 03/14/2012   Ovarian tumor of borderline malignancy, right 2004   Palpitations    a. 02/2022 Zio: Predominantly sinus rhythm at 67 (52-171).  2 runs of NSVT -fastest 9 beats at 167.  35 SVT runs-longest 19 beats, fastest  171x6 beats.   Pancreatitis    secondary to Cholelithiasis   Parkinson disease (HCC) 02/10/2015   Post-thoracotomy pain syndrome    Pseudomonas pneumonia (HCC) 03/2008   Status post implantation of mitral valve leaflet clip 02/15/2022   s/p XTW MitraClip x2 by Crystal Bass in the A2/P2: b. 04/03/23 s/p third mitraclip implantation.   Traumatic closed fracture of distal clavicle with minimal displacement, left, initial encounter 06/2021   [facility]   Uterine fibroid    Vitamin D deficiency     Past Surgical History:  Procedure Laterality Date   ABDOMINAL HYSTERECTOMY     & BSO for Mucinous borderline tumor of R ovary 2004   APPENDECTOMY  2004   BREAST BIOPSY  08/2004   right breast-benign   BREAST LUMPECTOMY  1992   benign   BUBBLE STUDY  12/27/2021   Procedure: BUBBLE STUDY;  Surgeon: Crystal Rives, MD;  Location: [facility] ENDOSCOPY;  Service: Cardiovascular;;   CARDIAC CATHETERIZATION  2007   essentially negation for significant CAD   CARDIOVERSION N/A 09/05/2023   Procedure: CARDIOVERSION;  Surgeon: Crystal Odea, MD;  Location: [facility] ORS;  Service: Cardiovascular;  Laterality: N/A;   CATARACT EXTRACTION W/PHACO Left 07/24/2023   Procedure: CATARACT EXTRACTION PHACO AND INTRAOCULAR LENS PLACEMENT (IOC) LEFT 5.34 0037.3;  Surgeon: Crystal Mola, MD;  Location: [facility] SURGERY CNTR;  Service: Ophthalmology;  Laterality: Left;   CATARACT EXTRACTION W/PHACO Right 08/07/2023   Procedure: CATARACT EXTRACTION PHACO AND INTRAOCULAR LENS PLACEMENT (IOC) RIGHT 7.83 00:47.9;  Surgeon: Crystal Mola, MD;  Location: [facility] SURGERY CNTR;  Service: Ophthalmology;  Laterality: Right;   CHOLECYSTECTOMY  2001   COLONOSCOPY  2014   Dr Crystal Bass , due 2019   COLONOSCOPY  09/2018   2 TA removed, int hem, no f/u needed (Dr Crystal Bass)    ESOPHAGOGASTRODUODENOSCOPY  09/2018   GERD with esophagitis and stricture dilated, antral gastritis negative for H pylori Crystal Bass)   HIP ARTHROPLASTY Left  08/21/2022   Procedure: ARTHROPLASTY BIPOLAR HIP (HEMIARTHROPLASTY);  Surgeon: Crystal Fairly, MD;  Location: [facility] ORS;  Service: Orthopedics;  Laterality: Left;   LUNG BIOPSY  2002   [name], Dr Edwyna Shell   MASTECTOMY MODIFIED RADICAL Left 2002   oral chemotheraphy (tamoxifen then Armidex) no radiation, CrystalGranforturna   RIGHT/LEFT HEART CATH AND CORONARY ANGIOGRAPHY Bilateral 11/14/2021   Procedure: RIGHT/LEFT HEART CATH AND CORONARY ANGIOGRAPHY;  Surgeon: Iran Ouch, MD;  Location: [facility] INVASIVE CV LAB;  Service: Cardiovascular;  Laterality: Bilateral;   TEE WITHOUT CARDIOVERSION N/A 12/29/2018   Procedure: TRANSESOPHAGEAL ECHOCARDIOGRAM (TEE);  Surgeon: Iran Ouch, MD;  Location: [facility] ORS;  Service: Cardiovascular;  Laterality: N/A;   TEE WITHOUT CARDIOVERSION N/A 12/27/2021   Procedure: TRANSESOPHAGEAL ECHOCARDIOGRAM (TEE);  Surgeon: Crystal Rives, MD;  Location: [facility] ENDOSCOPY;  Service: Cardiovascular;  Laterality: N/A;  TEE WITHOUT CARDIOVERSION N/A 02/15/2022   Procedure: TRANSESOPHAGEAL ECHOCARDIOGRAM (TEE);  Surgeon: Tonny Bollman, MD;  Location: [facility] INVASIVE CV LAB;  Service: Cardiovascular;  Laterality: N/A;   TEE WITHOUT CARDIOVERSION N/A 05/14/2022   Procedure: TRANSESOPHAGEAL ECHOCARDIOGRAM (TEE);  Surgeon: Crystal Rives, MD;  Location: [facility] ENDOSCOPY;  Service: Cardiovascular;  Laterality: N/A;   TEE WITHOUT CARDIOVERSION N/A 04/03/2023   Procedure: TRANSESOPHAGEAL ECHOCARDIOGRAM;  Surgeon: Tonny Bollman, MD;  Location: [facility] INVASIVE CV LAB;  Service: Cardiovascular;  Laterality: N/A;   TEE WITHOUT CARDIOVERSION N/A 09/05/2023   Procedure: TRANSESOPHAGEAL ECHOCARDIOGRAM;  Surgeon: Crystal Odea, MD;  Location: [facility] ORS;  Service: Cardiovascular;  Laterality: N/A;   TONSILLECTOMY  1946   TRANSCATHETER MITRAL EDGE TO EDGE REPAIR N/A 02/15/2022   Procedure: MITRAL VALVE REPAIR;  Surgeon: Tonny Bollman, MD;  Location: [facility] INVASIVE CV LAB;  Service:  Cardiovascular;  Laterality: N/A;   TRANSCATHETER MITRAL EDGE TO EDGE REPAIR N/A 04/03/2023   Procedure: MITRAL VALVE REPAIR;  Surgeon: Tonny Bollman, MD;  Location: [facility] INVASIVE CV LAB;  Service: Cardiovascular;  Laterality: N/A;     Home Medications:  Prior to Admission medications   Medication Sig Start Date End Date Taking? Authorizing Provider  acetaminophen (TYLENOL) 500 MG tablet Take 1,000 mg by mouth 3 (three) times daily.   Yes [provider]  albuterol (VENTOLIN HFA) 108 (90 Base) MCG/ACT inhaler Inhale 2 puffs into the lungs every 6 (six) hours as needed for wheezing or shortness of breath. 06/04/22  Yes Eustaquio Boyden, MD  amiodarone (PACERONE) 100 MG tablet Take 100 mg by mouth daily.   Yes [provider]  apixaban (ELIQUIS) 2.5 MG TABS tablet Take 1 tablet (2.5 mg total) by mouth 2 (two) times daily. 08/27/23  Yes Iran Ouch, MD  calcium carbonate (OSCAL) 1500 (600 Ca) MG TABS tablet Take 600 mg of elemental calcium by mouth daily.   Yes [provider]  calcium carbonate (TUMS - DOSED IN MG ELEMENTAL CALCIUM) 500 MG chewable tablet Chew 2 tablets by mouth daily as needed for indigestion or heartburn.   Yes [provider]  clonazepam (KLONOPIN) 0.125 MG disintegrating tablet Take by mouth at bedtime. 01/15/24  Yes [provider]  clonazePAM (KLONOPIN) 0.5 MG tablet TAKE 0.25 A TABLET = 0.125 mg BY MOUTH AT BEDTIME 01/17/24  Yes Medina-Vargas, Monina C, NP  fluticasone (FLONASE) 50 MCG/ACT nasal spray Place 2 sprays into both nostrils daily as needed. 10/15/23  Yes [provider]  furosemide (LASIX) 40 MG tablet TAKE ONE TABLET BY MOUTH ONCE DAILY *NEW DOSE* 03/29/23  Yes Iran Ouch, MD  magnesium hydroxide (MILK OF MAGNESIA) 400 MG/5ML suspension Take 30 mLs by mouth daily as needed for mild constipation.   Yes [provider]  nitroGLYCERIN (NITROSTAT) 0.4 MG SL tablet Place 0.4 mg under the tongue every  5 (five) minutes as needed for chest pain. 12/30/23  Yes [provider]  pantoprazole (PROTONIX) 20 MG tablet Take 1 tablet (20 mg total) by mouth daily. 04/01/23  Yes Eustaquio Boyden, MD  aluminum-magnesium hydroxide 200-200 MG/5ML suspension Take 10 mLs by mouth every 4 (four) hours as needed for indigestion. Patient not taking: Reported on 02/04/2024    [provider]  bisacodyl (DULCOLAX) 10 MG suppository Place 10 mg rectally as needed for moderate constipation.    [provider]  Carbidopa-Levodopa ER (SINEMET CR) 25-100 MG tablet controlled release 2 at 9am, 2 at 1pm, 1 at 5pm Patient not taking: Reported on  02/10/2024 02/04/24   Tat, Octaviano Batty, DO  Cholecalciferol (VITAMIN D3) 25 MCG (1000 UT) CAPS Take 1 capsule (1,000 Units total) by mouth daily. 09/27/22   Eustaquio Boyden, MD  docusate sodium (COLACE) 100 MG capsule Take 100 mg by mouth 2 (two) times daily.    [provider]  EPINEPHrine 0.3 mg/0.3 mL IJ SOAJ injection Inject 0.3 mg into the muscle as needed for anaphylaxis. 02/25/20   [provider]  fexofenadine (ALLEGRA) 30 MG/5ML suspension Take 5 mLs (30 mg total) by mouth 2 (two) times daily. 01/20/24   Medina-Vargas, Monina C, NP  Fluticasone-Umeclidin-Vilant (TRELEGY ELLIPTA) 100-62.5-25 MCG/ACT AEPB Inhale 1 puff into the lungs daily. 04/27/22   Cobb, Ruby Cola, NP  gabapentin ([name]) 300 MG capsule Take Two capsules by mouth twice daily. Patient not taking: Reported on 02/10/2024 12/19/23   Crystal Mealing, MD  polyethylene glycol (MIRALAX / GLYCOLAX) 17 g packet Take 17 g by mouth daily as needed.    [provider]    Inpatient Medications: Scheduled Meds:  amiodarone  100 mg Oral Daily   Carbidopa-Levodopa ER  1 tablet Oral QAC supper   Carbidopa-Levodopa ER  2 tablet Oral BID   docusate sodium  100 mg Oral BID   fluticasone furoate-vilanterol  1 puff Inhalation Daily   And   umeclidinium bromide  1 puff  Inhalation Daily   furosemide  40 mg Oral Daily   Continuous Infusions:   ceFAZolin (ANCEF) IV     PRN Meds: albuterol, HYDROcodone-acetaminophen, HYDROmorphone (DILAUDID) injection, methocarbamol **OR** methocarbamol (ROBAXIN) injection, nitroGLYCERIN, senna-docusate  Allergies:    Allergies  Allergen Reactions   Sulfa Antibiotics Anaphylaxis   Sulfonamide Derivatives Anaphylaxis   Topamax [Topiramate] Other (See Comments)    Metabolic acidosis    Clarithromycin Other (See Comments)    pericarditis  Other reaction(s): Not available   Hydrocodone Other (See Comments)    "hyper and climbing the walls"   Motrin [Ibuprofen] Other (See Comments)    headaches   Misc. Sulfonamide Containing Compounds     Other reaction(s): Not available   Symbicort [Budesonide-Formoterol Fumarate] Other (See Comments)    02/07/15 tremor   Tetracycline Hcl Other (See Comments)   Percocet [Oxycodone-Acetaminophen] Itching and Rash   Tape Itching and Rash    Use paper tape only   Tetracyclines & Related Other (See Comments)    "immediate yeast infection"    Social History:   Social History   Socioeconomic History   Marital status: Widowed    Spouse name: Not on file   Number of children: 0   Years of education: Not on file   Highest education level: Not on file  Occupational History   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, 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, 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, 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  Tobacco Use   Smoking status: Never   Smokeless tobacco: Never  Vaping Use   Vaping status: Never Used  Substance and Sexual Activity   Alcohol use: No    Alcohol/week: 0.0 standard drinks of alcohol   Drug use: No   Sexual activity: Not Currently    Birth control/protection: Surgical    Comment: TAH/BSO  Other Topics Concern   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    Aunt of Dr Gilmore Laroche    L[facility] independent living at [facility]    No children    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, 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,  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, 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)    Activity: walking about 1 mile/day, enjoys [employer]  Diet: some water, fruits/vegetables some    Right handed    Social Drivers of Health   Financial Resource Strain: Low Risk  (08/13/2022)   Overall Financial Resource Strain (CARDIA)    Difficulty of Paying Living Expenses: Not hard at all  Food Insecurity: No Food Insecurity (09/05/2023)   Hunger Vital Sign    Worried About Running Out of Food in the Last Year: Never true    Ran Out of Food in the Last Year: Never true  Transportation Needs: No Transportation Needs (09/05/2023)   PRAPARE - Administrator, Civil Service (Medical): No    Lack of Transportation (Non-Medical): No  Physical Activity: Insufficiently Active (08/13/2022)   Exercise Vital Sign    Days of Exercise per Week: 4 days    Minutes of Exercise per Session: 30 min  Stress: No Stress Concern Present (08/13/2022)   [employer] of Occupational Health - Occupational Stress Questionnaire    Feeling of Stress : Not at all  Social Connections: Socially Isolated (08/13/2022)   Social Connection and Isolation Panel [NHANES]    Frequency of Communication with Friends and Family: More than three times a week    Frequency of Social Gatherings with Friends and Family: Once a week    Attends Religious Services: Never    Database 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 or Organizations: No    Attends Banker Meetings: Never    Marital Status: Widowed  Intimate Partner Violence: Not At Risk (09/05/2023)   Humiliation, Afraid, Rape, and Kick questionnaire    Fear of Current or Ex-Partner: No    Emotionally Abused: No    Physically Abused: No    Sexually Abused: No    Family History:    Family History  Problem Relation Age of Onset   Emphysema Mother        d.64 was never a smoker   Lung cancer Father 78       d.82 history of smoking   Breast cancer Sister 3   Colon cancer Sister    Melanoma Maternal Aunt    Liver cancer Maternal Aunt    Cancer  Maternal Uncle        unspecified type   Cancer Maternal Uncle        unspecified type   Cancer Maternal Uncle        unspecified type   Stroke Paternal Aunt        in mid 25s   Osteoporosis Paternal Aunt    Myasthenia gravis Paternal Aunt    Cancer Maternal Grandmother        d.82s unspecified GI cancer   Cancer Maternal Grandfather        d.62s unspecified type   Breast cancer Cousin        d.60s-daughter of unaffected paternal aunt Doristine Church   Colon cancer Cousin 60       d.70-daughter of unaffected maternal aunt Leila   Lung cancer Cousin 70       d.70-sisters to each other, both daughters of maternal uncle Johnny   Parkinson's disease Cousin    Diabetes Neg Hx    Heart disease Neg Hx    Stomach cancer Neg Hx    Ulcerative colitis Neg Hx      ROS:  Please see the history of present illness.   All other ROS reviewed and negative.     Physical Exam/Data:   Vitals:   02/10/24 0500 02/10/24 0600 02/10/24 0700 02/10/24 0722  BP: 137/60 [name])  168/83 (!) 119/59   Pulse: (!) 59 69 61   Resp: 13 (!) 23 14   Temp:      TempSrc:      SpO2: 94% 95% 94% 94%  Weight:      Height:        Intake/Output Summary (Last 24 hours) at 02/10/2024 0739 Last data filed at 02/10/2024 0708 Gross per 24 hour  Intake --  Output 200 ml  Net -200 ml      02/09/2024    4:49 PM 02/04/2024   12:59 PM 01/20/2024    6:19 PM  Last 3 Weights  Weight (lbs) 125 lb 10.6 oz 125 lb 9.6 oz 122 lb 12.8 oz  Weight (kg) 57 kg 56.972 kg 55.702 kg     Body mass index is 22.98 kg/m.  General:  frail elderly AA female HEENT: normal Neck: no JVD Vascular: No carotid bruits; Distal pulses 2+ bilaterally Cardiac:  normal S1, S2; RRR; + murmur  Lungs:  clear to auscultation bilaterally, no wheezing, rhonchi or rales  Abd: soft, nontender, no hepatomegaly  Ext: no edema Musculoskeletal:  No deformities, BUE and BLE strength normal and equal Skin: warm and dry  Neuro:  CNs 2-12 intact, no focal  abnormalities noted Psych:  Normal affect   EKG:  The EKG was personally reviewed and demonstrates:  NSR 66bpm, TWI inf leads, ST depression inf/lat leads, LVY with repol Telemetry:  Telemetry was personally reviewed and demonstrates:  Nsr HR 50-60s  Relevant CV Studies:  Echo 04/2023 1. Left ventricular ejection fraction, by estimation, is 60 to 65%. The  left ventricle has normal function. The left ventricle has no regional  wall motion abnormalities. There is mild left ventricular hypertrophy.  Left ventricular diastolic parameters  are indeterminate.   2. Right ventricular systolic function is normal. The right ventricular  size is mildly enlarged. There is mildly elevated pulmonary artery  systolic pressure. The estimated right ventricular systolic pressure is  38.5 mmHg.   3. The mitral valve has been repaired/replaced. There is a Mitra-Clip  present in the mitral position. The average mean gradient is at HR  56bpm. There is moderate mitral regurgitation that appears slightly more  significant compared to prior TTE  best appreciated on SAX views.   4. Left atrial size was severely dilated.   5. An iatrogenic ASD is present with left to right shunting. Evidence of  atrial level shunting detected by color flow Doppler.   6. Right atrial size was moderately dilated.   7. The aortic valve is tricuspid. There is mild calcification of the  aortic valve. There is mild thickening of the aortic valve. Aortic valve  regurgitation is not visualized. Aortic valve sclerosis/calcification is  present, without any evidence of  aortic stenosis.   8. Aortic dilatation noted. There is mild dilatation of the ascending  aorta, measuring 41 mm.   9. The inferior vena cava is normal in size with greater than 50%  respiratory variability, suggesting right atrial pressure of 3 mmHg.    Echo 03/2023 1. S/p additonal Mitra-Clip procedure. Mild to moderate mitral  regurgitation. There is  systolic blunting of the right sided pulmonary  vein flow. Triple orifice area 2D planimetry of 3.03 cm2. There are two  regurgintant jets; a medial jet and a central  jet between the three Mitra-Clips. There are two iatrogenic ASDs with left  to right flow. The mitral valve has been repaired/replaced. Mild to  moderate mitral valve regurgitation.  The mean mitral valve gradient is 4.0  mmHg with average heart rate of 58  bpm. There is a Mitra-Clip present in the mitral position. Procedure Date:  04/03/23.   2. Left ventricular ejection fraction, by estimation, is 60 to 65%. The  left ventricle has normal function. The left ventricle has no regional  wall motion abnormalities. There is mild concentric left ventricular  hypertrophy. Left ventricular diastolic  parameters are indeterminate.   3. Right ventricular systolic function is normal. The right ventricular  size is moderately enlarged. There is moderately elevated pulmonary artery  systolic pressure.   4. Left atrial size was severely dilated.   5. Evidence of atrial level shunting detected by color flow Doppler.   6. Right atrial size was mild to moderately dilated.   7. The aortic valve is tricuspid. Aortic valve regurgitation is trivial.  No aortic stenosis is present.   8. The inferior vena cava is dilated in size with >50% respiratory  variability, suggesting right atrial pressure of 8 mmHg.    Mitral valve repair 04/03/23 Successful TEER of the mitral valve with a MitraClip G4 XTW device, positioned A2/P2, lateral to the indwelling Clip devices   MR reduced from 4+ to 2+ post-procedure   Echo 01/2023 1. Left ventricular ejection fraction, by estimation, is 60 to 65%. The  left ventricle has normal function. The left ventricle has no regional  wall motion abnormalities. There is moderate left ventricular hypertrophy.  Left ventricular diastolic  parameters are indeterminate.   2. Right ventricular systolic function is normal.  The right ventricular  size is normal. There is mildly elevated pulmonary artery systolic  pressure. The estimated right ventricular systolic pressure is 45.0 mmHg.   3. Left atrial size was severely dilated.   4. S/p Mitraclip with 2 XTW clips in the A2-P2 position. Severe eccentric  mitral valve regurgitation, originating lateral to the clips. 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 The mitral valve has  been repaired/replaced. Severe  mitral valve regurgitation. The mean mitral valve gradient is 5.4 mmHg  with average heart rate of 62 bpm. There is a Mitra-Clip present in the  mitral position. Procedure Date: 02/15/2022.   5. Tricuspid valve regurgitation is mild to moderate.   6. The aortic valve is grossly normal. Aortic valve regurgitation is  trivial. No aortic stenosis is present.   7. Iatrogenic atrial septal defect s/p transeptal puncture.. Evidence of  atrial level shunting detected by color flow Doppler. There is a atrial  septal defect with predominantly left to right shunting across the atrial  septum.   8. The inferior vena cava is normal in size with greater than 50%  respiratory variability, suggesting right atrial pressure of 3 mmHg.    Mitral valve repair 01/2022 Successful TEER with placement of 2 Mitraclip XTW devices, reducing non-rheumatic MR from 4+ to 1+, with both clips positioned A2/P2. Mean transmitral gradient 4 mmhg at case completion.    See full op note dictated separately  Laboratory Data:  High Sensitivity Troponin:   Recent Labs  Lab 02/09/24 2100 02/09/24 2312  TROPONINIHS 87* 152*     Chemistry Recent Labs  Lab 02/09/24 1654  NA 137  K 3.9  CL 98  CO2 28  GLUCOSE 107*  BUN 19  CREATININE 1.06*  CALCIUM 9.7  GFRNONAA 53*  ANIONGAP 11    No results for input(s): "PROT", "ALBUMIN", "AST", "ALT", "ALKPHOS", "BILITOT" in the last 168 hours. Lipids No results for input(s): "  CHOL", "TRIG", "HDL", "LABVLDL",  "LDLCALC", "CHOLHDL" in the last 168 hours.  Hematology Recent Labs  Lab 02/09/24 1654  WBC 6.5  RBC 4.00  HGB 12.9  HCT 39.8  MCV 99.5  MCH 32.3  MCHC 32.4  RDW 13.4  PLT 158   Thyroid No results for input(s): "TSH", "FREET4" in the last 168 hours.  BNP Recent Labs  Lab 02/09/24 2100  BNP 467.8*    DDimer No results for input(s): "DDIMER" in the last 168 hours.   Radiology/Studies:  DG Chest 1 View Result Date: 02/09/2024 CLINICAL DATA:  Larey Seat, pain EXAM: CHEST  1 VIEW COMPARISON:  09/04/2023 FINDINGS: Single frontal view of the chest demonstrates stable enlargement of the cardiac silhouette. Postsurgical changes are seen from mitral valve repair. There is increased consolidation within the medial right upper lung zone since prior study, consistent with airspace disease superimposed upon chronic atelectasis and bronchiectasis seen on prior CTs. Increased pulmonary vascular congestion. No effusion or pneumothorax. No acute bony abnormalities. IMPRESSION: 1. Enlarged cardiac silhouette. 2. Pulmonary vascular congestion. Increased consolidation within the medial right upper lung zone may reflect airspace disease superimposed upon known chronic scarring and bronchiectasis. Electronically Signed   By: Sharlet Salina M.D.   On: 02/09/2024 17:44   DG Hip Unilat W or Wo Pelvis 2-3 Views Right Result Date: 02/09/2024 CLINICAL DATA:  Right hip pain EXAM: DG HIP (WITH OR WITHOUT PELVIS) 2-3V RIGHT COMPARISON:  09/22/2022 FINDINGS: Acute, mildly comminuted intertrochanteric fracture of the proximal right femur with mild displacement and varus angulation. Hip joint alignment is maintained without dislocation. Prior left hip arthroplasty. Bony pelvis intact without evidence of fracture or diastasis. Soft tissue swelling overlies the right hip laterally. IMPRESSION: Acute, mildly comminuted intertrochanteric fracture of the proximal right femur. Electronically Signed   By: Duanne Guess D.O.   On:  02/09/2024 17:43   DG Wrist Complete Right Result Date: 02/09/2024 CLINICAL DATA:  Larey Seat, right wrist pain EXAM: RIGHT WRIST - COMPLETE 3+ VIEW COMPARISON:  None Available. FINDINGS: Frontal, oblique, lateral, and ulnar deviated views of the right wrist are obtained. There is an impacted extra-articular fracture through the distal right radial metaphysis, with mild impaction at the fracture site. Anatomic alignment of the radiocarpal joint. Prominent osteoarthritis within the radial aspect of the carpus and at the first carpometacarpal joint. Mild soft tissue swelling. IMPRESSION: 1. Impacted extra-articular fracture of the distal radial metaphysis, with near anatomic alignment. 2. 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 3. Soft tissue swelling. Electronically Signed   By: Sharlet Salina M.D.   On: 02/09/2024 17:41     Assessment and Plan:   Pre-op clearance for right hip fracture Elevated troponin Elevated BNP S/p Mitral clip x 3 Orthostatic hypotension Per prior notes she presented with a fall from SNF found to have right gip fracture and right wrist fracture. Plan for surgery 3/17. On my interview she is A&O x1 and hardly responsive. She did deny chest pain.  Suspect pains meds contributing to mental status. She has a h/o Afib on low dose Eliquis and amiodarone. Eliquis held for surgery, unsure when last dose was. Amiodarone 100mg  daily continued. She is in SR/SB. BNP elevated to the 400s, but does not appear significantly volume up. She was given IV lasix in the ER. HS trop elevated 22>87>152. She denies chest pain, EKG shows NSR with inf/lat ST/T wave changes, LVH with repol. Overall not too different from prior. LHC in 10/2021 showed  normal coronaries, which is reassuring. She has a history of mitral valve clip x3, mild murmur on exam. Given mildly elevated BNP and elevated troponin, echo has been ordered. TEE 08/2023 showed LVEF 50-55%,  moderate MR with a mean gradient of 6 mmHg. Suspect elevated troponin is d/t supply demand mismatch. Due to mental status unable to assess function PTA, but suspect it is limited. If echo is stable, Ok to proceed with surgery. Restart Eliquis ASAP post-surgery. Monitor volume status closely.   RCRI is 6% risk of MACE.  For questions or updates, please contact [facility] Please consult [email or web address] for contact info under    Signed, Brianna Esson David Stall, PA-C  02/10/2024 7:39 AM

## 2024-02-10 NOTE — Assessment & Plan Note (Signed)
 Troponin 87-152 possible demand ischemia Patient denies chest pain and EKG with no ischemic changes Echocardiogram to evaluate for wall motion abnormality

## 2024-02-10 NOTE — Anesthesia Procedure Notes (Addendum)
 Procedure Name: Intubation Date/Time: 02/10/2024 12:50 PM  Performed by: Stormy Fabian, CRNAPre-anesthesia Checklist: Patient identified, Patient being monitored, Timeout performed, Emergency Drugs available and Suction available Patient Re-evaluated:Patient Re-evaluated prior to induction Oxygen Delivery Method: Circle system utilized Preoxygenation: Pre-oxygenation with 100% oxygen Induction Type: IV induction Ventilation: Mask ventilation without difficulty Laryngoscope Size: Mac and 3 Grade View: Grade I Tube type: Oral Tube size: 6.5 mm Number of attempts: 1 Airway Equipment and Method: Stylet Placement Confirmation: ETT inserted through vocal cords under direct vision, positive ETCO2 and breath sounds checked- equal and bilateral Secured at: 20 cm Tube secured with: Tape Dental Injury: Teeth and Oropharynx as per pre-operative assessment

## 2024-02-10 NOTE — Op Note (Signed)
 02/10/2024  2:57 PM  Patient:   Crystal Bass  Pre-Op Diagnosis:   Closed displaced intertrochanteric right hip fracture.  Post-Op Diagnosis:   Same  Procedure:   Reduction and internal fixation of placed intertrochanteric right hip fracture with Biomet Affixis TFN nail.  Surgeon:   Maryagnes Amos, MD  Assistant:   None  Anesthesia:   GET  Findings:   As above  Complications:   None  EBL:   500 cc  Fluids:   800 cc crystalloid, 250 cc of albumin  UOP:   450 cc  TT:   None  Drains:   None  Closure:   Staples  Implants:   Biomet Affixis 11 x 360 mm TFN with a 100 mm lag screw and a 42 mm distal interlocking screw  Brief Clinical Note:   The patient is an 83 year old female who sustained the above-noted injury yesterday afternoon when she lost her balance and fell on her right side while trying to get something off her coffee table. The patient was brought to the emergency room where x-rays demonstrated the above-noted injury. The patient has been cleared medically and presents at this time for reduction and internal fixation of the displaced intertrochanteric right hip fracture.  Procedure:   The patient was brought into the operating room. After adequate spinal anesthesia was obtained, the patient was lain in the supine position on the fracture table. The uninjured leg was placed in a flexed and abducted position while the injured lower extremity was placed in longitudinal traction. The fracture was reduced using longitudinal traction and internal rotation. The adequacy of reduction was verified fluoroscopically in AP and lateral projections and found to be near anatomic. The lateral aspects of the right hip and thigh were prepped with ChloraPrep solution before being draped sterilely. Preoperative antibiotics were administered. A timeout was performed to verify the appropriate surgical site.   The greater trochanter was identified fluoroscopically and an approximately 3  cm incision made about 2-3 fingerbreadths above the tip of the greater trochanter. The incision was carried down through the subcutaneous tissues to expose the gluteal fascia. This was split the length of the incision, providing access to the tip of the trochanter. Under fluoroscopic guidance, a guidewire was drilled through the tip of the trochanter into the proximal metaphysis to the level of the lesser trochanter. After verifying its position fluoroscopically in AP and lateral projections, it was overreamed with the initial reamer to the depth of the lesser trochanter. A guidewire was passed down through the femoral canal to the supracondylar region. The adequacy of guidewire position was verified fluoroscopically in AP and lateral projections before the length of the guidewire within the canal was measured and found to be 385 mm. Therefore, a 360 mm length nail was selected. The guidewire was overreamed sequentially using the flexible reamers, beginning with a 10.5 mm reamer and progressing to a 1.5 mm reamer. This provided good cortical chatter. The left by 360 mm Biomet Affixis TFN rod was selected and advanced to the appropriate depth, as verified fluoroscopically.   The guide system for the lag screw was positioned and advanced through an approximately 2 cm stab incision over the lateral aspect of the proximal femur. The guidewire was drilled up through the trochanteric femoral nail and into the femoral neck to rest within 5 mm of subchondral bone. After verifying its position in the femoral neck and head in both AP and lateral projections, the guidewire was measured and found  to be optimally replicated by a 100 mm lag screw. The guidewire was overreamed to the appropriate depth before the lag screw was inserted and advanced to the appropriate depth as verified fluoroscopically in AP and lateral projections. The locking screw was advanced, then backed off a quarter turn to set the lag screw. Again the  adequacy of hardware position and fracture reduction was verified fluoroscopically in AP and lateral projections and found to be excellent.  Attention was directed distally. Using the "perfect circle" technique, the leg and fluoroscopy machine were positioned appropriately. An approximately 1.5 cm stab incision was made over the skin at the appropriate point before the drill bit was advanced through the cortex and across the static hole of the nail. The appropriate length of the screw was determined before the 42 mm distal interlocking screw was positioned, then advanced and tightened securely. Again the adequacy of screw position was verified fluoroscopically in AP and lateral projections and found to be excellent.  The patient was bleeding fairly steadily from the greater trochanteric region. After careful inspection of the wound, was felt that this was coming from the bone itself. Therefore, the proximal end of the canal was packed with a thrombin-soaked piece of Gelfoam to help mitigate the bleeding. The wounds were irrigated thoroughly with sterile saline solution before the abductor fascia was reapproximated using #0 Vicryl interrupted sutures. The subcutaneous tissues were closed using 2-0 Vicryl interrupted sutures. The skin was closed using staples. A total of 20 cc of Exparel +10 cc of 0.5% Sensorcaine with epinephrine was injected in and around all incisions. Sterile occlusive dressings were applied to all wounds before the patient was transferred back to her hospital bed. The patient was then awakened, extubated, and returned to the recovery room in satisfactory condition after tolerating the procedure well.

## 2024-02-10 NOTE — Discharge Instructions (Signed)
 the [facility] offers a [employer] that anyone can call toll free at [phone]. The friendship line is available 24 hours a day  [employer] is a Program of All-inclusive Care for the Elderly (PACE). Their mission is to promote and sustain the independence of seniors wishing to remain in the community. They provide seniors with comprehensive long-term health, social, medical and dietary care. Their program is a safe alternative to nursing home care. [phone]  [facility] Physical Address [facility] [address] [place], [place] [address] Phone: [phone]. . Online zoom yoga class, connect with others without leaving your home [facility] offers [place] dance cardio sessions for individuals via Zoom. This program provides: - Dance fitness activities Please contact program for more information. Servinganyone in need adults 18+ hiv/aids individuals families Call [phone]  Email [email or web address] to get more info  [employer] offers an online [employer] to individuals where they can receive help to focus on their best health. Whether you're a [employer] member or not, the neighborhood center offers a... Main Serviceshealth education  exercise & fitness  community support services  recreation  virtual support Other Servicessupport groups Servinganyone in need adults young adults teens seniors individuals families [email or web address] to get more info  Schedule on their website  The [facility] offers an array of activities for adults age 81 and over. This program provides:- Fitness and health programs- Tech classes- Activity books Main Serviceshealth education  community support services  exercise & fitness  recreation  more education Servingseniors  Call [phone]    For more resources go online to [email or web address] and type in you zipcode   Diet: As you were doing prior  to hospitalization   Shower:  May shower but keep the wounds dry, use an occlusive plastic wrap, NO SOAKING IN TUB.  If the bandage gets wet, change with a clean dry gauze.  Dressing:  You may change your dressing as needed. Change the dressing with sterile gauze dressing.  Leave the right wrist splint intact until follow-up.  Activity:  Increase activity slowly as tolerated, but follow the weight bearing instructions below.  No lifting or driving for 6 weeks.  Weight Bearing:   Weight bearing as tolerated to right lower extremity.  NWB to the right arm.  To prevent constipation: you may use a stool softener such as -  Colace (over the counter) 100 mg by mouth twice a day  Drink plenty of fluids (prune juice may be helpful) and high fiber foods Miralax (over the counter) for constipation as needed.    Itching:  If you experience itching with your medications, try taking only a single pain pill, or even half a pain pill at a time.  You may take up to 10 pain pills per day, and you can also use benadryl over the counter for itching or also to help with sleep.   Precautions:  If you experience chest pain or shortness of breath - call 911 immediately for transfer to the hospital emergency department!!  If you develop a fever greater that 101 F, purulent drainage from wound, increased redness or drainage from wound, or calf pain-Call [facility]                                              Follow- Up Appointment:  Please call for an appointment to be seen in 2 weeks at [facility]

## 2024-02-11 ENCOUNTER — Encounter: Payer: Self-pay | Admitting: Surgery

## 2024-02-11 DIAGNOSIS — S72001A Fracture of unspecified part of neck of right femur, initial encounter for closed fracture: Secondary | ICD-10-CM | POA: Diagnosis not present

## 2024-02-11 DIAGNOSIS — E43 Unspecified severe protein-calorie malnutrition: Secondary | ICD-10-CM | POA: Diagnosis not present

## 2024-02-11 DIAGNOSIS — Z7189 Other specified counseling: Secondary | ICD-10-CM | POA: Diagnosis not present

## 2024-02-11 DIAGNOSIS — R7989 Other specified abnormal findings of blood chemistry: Secondary | ICD-10-CM | POA: Diagnosis not present

## 2024-02-11 DIAGNOSIS — I4891 Unspecified atrial fibrillation: Secondary | ICD-10-CM | POA: Diagnosis not present

## 2024-02-11 LAB — CBC
HCT: 28.7 % — ABNORMAL LOW (ref 36.0–46.0)
Hemoglobin: 9.3 g/dL — ABNORMAL LOW (ref 12.0–15.0)
MCH: 32.2 pg (ref 26.0–34.0)
MCHC: 32.4 g/dL (ref 30.0–36.0)
MCV: 99.3 fL (ref 80.0–100.0)
Platelets: 109 10*3/uL — ABNORMAL LOW (ref 150–400)
RBC: 2.89 MIL/uL — ABNORMAL LOW (ref 3.87–5.11)
RDW: 13.6 % (ref 11.5–15.5)
WBC: 9.4 10*3/uL (ref 4.0–10.5)
nRBC: 0 % (ref 0.0–0.2)

## 2024-02-11 LAB — BASIC METABOLIC PANEL
Anion gap: 9 (ref 5–15)
BUN: 31 mg/dL — ABNORMAL HIGH (ref 8–23)
CO2: 29 mmol/L (ref 22–32)
Calcium: 9 mg/dL (ref 8.9–10.3)
Chloride: 98 mmol/L (ref 98–111)
Creatinine, Ser: 1.14 mg/dL — ABNORMAL HIGH (ref 0.44–1.00)
GFR, Estimated: 49 mL/min — ABNORMAL LOW (ref >60)
Glucose, Bld: 143 mg/dL — ABNORMAL HIGH (ref 70–99)
Potassium: 4.2 mmol/L (ref 3.5–5.1)
Sodium: 136 mmol/L (ref 135–145)

## 2024-02-11 LAB — HEMOGLOBIN: Hemoglobin: 9.3 g/dL — ABNORMAL LOW (ref 12.0–15.0)

## 2024-02-11 MED ORDER — ADULT MULTIVITAMIN W/MINERALS CH
1.0000 | ORAL_TABLET | Freq: Every day | ORAL | Status: DC
  Administered 2024-02-12 – 2024-02-13 (×2): 1 via ORAL
  Filled 2024-02-11 (×2): qty 1

## 2024-02-11 MED ORDER — APIXABAN 2.5 MG PO TABS
2.5000 mg | ORAL_TABLET | Freq: Two times a day (BID) | ORAL | Status: DC
  Administered 2024-02-11 – 2024-02-13 (×4): 2.5 mg via ORAL
  Filled 2024-02-11 (×4): qty 1

## 2024-02-11 MED ORDER — HYDROCODONE-ACETAMINOPHEN 5-325 MG PO TABS
1.0000 | ORAL_TABLET | Freq: Four times a day (QID) | ORAL | Status: DC | PRN
  Administered 2024-02-11: 2 via ORAL
  Filled 2024-02-11: qty 2

## 2024-02-11 NOTE — TOC Progression Note (Signed)
 Transition of Care [facility]) - Progression Note    Patient Details  Name: Crystal Bass MRN: [id number] Date of Birth: Jan 09, 1941  [facility]) CM/SW Contact  Marlowe Sax, RN Phone Number: 02/11/2024, 2:54 PM  Clinical Narrative:    Confirmed that the patient is from ALF at [facility] will go to [facility], Sent the Bed request to [facility] thru [facility], PASSR completed, FL2 completed   Expected Discharge Plan: Skilled Nursing Facility Barriers to Discharge: SNF Pending bed offer, Insurance Authorization  Expected Discharge Plan and Services   Discharge Planning Services: CM Consult   Living arrangements for the past 2 months: Assisted Living Facility                                       Social Determinants of Health (SDOH) Interventions SDOH Screenings   Food Insecurity: No Food Insecurity (02/10/2024)  Housing: Low Risk  (02/10/2024)  Transportation Needs: No Transportation Needs (02/10/2024)  Utilities: Not At Risk (02/10/2024)  Alcohol Screen: Low Risk  (08/13/2022)  Depression (PHQ2-9): Low Risk  (12/07/2022)  Financial Resource Strain: Low Risk  (08/13/2022)  Physical Activity: Insufficiently Active (08/13/2022)  Social Connections: Moderately Isolated (02/10/2024)  Stress: No Stress Concern Present (08/13/2022)  Tobacco Use: Low Risk  (02/10/2024)    Readmission Risk Interventions     No data to display

## 2024-02-11 NOTE — Consult Note (Cosign Needed Addendum)
 Consultation Note Date: 02/11/2024   Patient Name: Crystal Bass  DOB: [birth year]  MRN: [id number]  Age / Sex: 83 y.o., female  PCP: Earnestine Mealing, MD Referring Physician: Delfino Lovett, MD  Reason for Consultation: Establishing goals of care  HPI/Patient Profile: PER  H&P: Crystal Bass is a 83 y.o. female with medical history significant for Parkinson's disease, COPD/bronchiectasis, mitral regurgitation s/p MitraClip in 2023 with redo 03/2023, diastolic CHF history of PE in 4098, A-fib in October s/p cardioversion, on Eliquis and amiodarone  being admitted with an accidental fall fall in which she sustained a right intertrochanteric fracture and a buckle fracture of the right distal radius.  Dr. Joice Lofts is aware and will take patient to the OR on 3/17 Patient was previously in her usual state of health.  Denies cough, fever, preceding chest pain, shortness of breath, palpitations, blurred vision, one-sided weakness numbness or tingling or headache. In the ED, slightly hypertensive to 162/98 but with otherwise normal vitals. Labs including CBC and BMP mostly on room creatinine of 1.06 up from her baseline of 0.8 Chest x-ray showed an enlarged cardiac silhouette and some pulmonary vascular congestion with increased consolidation within the medial right upper lung zone respecting airspace disease superimposed upon known chronic scarring and bronchiectasis.  Clinical Assessment and Goals of Care: Notes and labs reviewed. Epic chat from attending and PT/OT.  In to see patient.  She is currently resting in bed, no family at bedside.  Upon assessing her she does open her eyes but does not speak.  Upon trying to converse with her I asked if she understood what I was saying and she shook her head no.    Stepped out and called her brother Loraine Leriche who is listed as HPOA under ACP tab.  Loraine Leriche discusses that his sister lives  in an apartment at [facility].  He states she uses a walker for mobility, but sometimes will walk around without it.  He states she enjoys going to do things with other residents and going to the beauty shop to get her hair done.  He states she has no cognitive or communication deficits at baseline.   He discusses that he is being kept updated by medical team.  We discussed her current status.  He states her current presentation is usual for her after receiving anesthesia, and it usually takes 3 to 4 days for her mental faculties to fully return. Patient is 1 day postop.  He would like a few days to assess outcomes.  PMT will follow up tomorrow.   SUMMARY OF RECOMMENDATIONS   PMT will follow-up again tomorrow.     Primary Diagnoses: Present on Admission:  Closed right hip fracture, initial encounter [facility])  History of pulmonary embolism 2019  Atrial fibrillation s/p cardioversion 08/2023 [facility])  Chronic heart failure with preserved ejection fraction (HFpEF) (HCC)   I have reviewed the medical record, interviewed the patient and family, and examined the patient. The following aspects are pertinent.  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 10/2021 Cath: Nl cors.   Arthritis    Asthma    Breast cancer (HCC) 2002   infiltrative ductal carcinoma    Cataract 2019   Chronic heart failure with preserved ejection fraction (HFpEF) (HCC)    COPD (chronic obstructive pulmonary disease) (HCC) 07/2007   Coronary artery disease    Ductal carcinoma of breast, estrogen receptor positive, stage 1 (HCC) 09/16/2012   Endometriosis    GERD (gastroesophageal reflux disease)    Hx of mitral valve repair    Hypercalcemia 09/14/2013   Hypertension    Lesion of breast 1992   right, benign   Lung disease    secondary to [name] infection   Mastalgia 05/1994   Mitral regurgitation    a. 10/2021 RHC: PCWP V wave of  34; b. 01/2022 TEE: Severe flail/prolapse of large area of A2 scallop with severe posteriorly directed MR; c. 01/2022 TEER w /Mitraclip XTW x 2; d. 07/2022 Echo: EF 60-65%. No rwma, mild LVH, GrIII DD. Sev dil LA. Mod elev PASP. Mod-Sev MR. Mod MS.   Moderate mitral regurgitation by prior echocardiogram    Osteoporosis, post-menopausal 03/14/2012   Ovarian tumor of borderline malignancy, right 2004   Palpitations    a. 02/2022 Zio: Predominantly sinus rhythm at 67 (52-171).  2 runs of NSVT -fastest 9 beats at 167.  35 SVT runs-longest 19 beats, fastest 171x6 beats.   Pancreatitis    secondary to Cholelithiasis   Parkinson disease (HCC) 02/10/2015   Post-thoracotomy pain syndrome    Pseudomonas pneumonia (HCC) 03/2008   Status post implantation of mitral valve leaflet clip 02/15/2022   s/p XTW MitraClip x2 by Dr. Excell Seltzer in the A2/P2: b. 04/03/23 s/p third mitraclip implantation.   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  Tobacco Use   Smoking status: Never   Smokeless tobacco: Never  Vaping Use   Vaping status: Never Used  Substance and Sexual Activity   Alcohol use: No    Alcohol/week: 0.0 standard drinks of alcohol   Drug use: No   Sexual activity: Not Currently    Birth control/protection: Surgical    Comment: TAH/BSO  Other Topics Concern   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    Aunt of Dr Gilmore Laroche    Lives independent living at [facility]    No children    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, 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, 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, 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)    Activity: walking about  1 mile/day, enjoys [employer]    Diet: some water, fruits/vegetables some    Right handed    Social Drivers of Corporate investment banker Strain: Low Risk  (08/13/2022)   Overall Financial Resource Strain (CARDIA)    Difficulty of Paying Living Expenses: Not hard at all  Food Insecurity: No Food Insecurity (02/10/2024)   Hunger Vital Sign    Worried About Running Out of Food in the  Last Year: Never true    Ran Out of Food in the Last Year: Never true  Transportation Needs: No Transportation Needs (02/10/2024)   PRAPARE - Administrator, Civil Service (Medical): No    Lack of Transportation (Non-Medical): No  Physical Activity: Insufficiently Active (08/13/2022)   Exercise Vital Sign    Days of Exercise per Week: 4 days    Minutes of Exercise per Session: 30 min  Stress: No Stress Concern Present (08/13/2022)   [employer] of Occupational Health - Occupational Stress Questionnaire    Feeling of Stress : Not at all  Social Connections: Moderately Isolated (02/10/2024)   Social Connection and Isolation Panel [NHANES]    Frequency of Communication with Friends and Family: More than three times a week    Frequency of Social Gatherings with Friends and Family: Once a week    Attends Religious Services: 1 to 4 times per year    Active Member of [employer] or Organizations: No    Attends Banker Meetings: Never    Marital Status: Widowed   Family History  Problem Relation Age of Onset   Emphysema Mother        d.64 was never a smoker   Lung cancer Father 73       d.82 history of smoking   Breast cancer Sister 91   Colon cancer Sister    Melanoma Maternal Aunt    Liver cancer Maternal Aunt    Cancer Maternal Uncle        unspecified type   Cancer Maternal Uncle        unspecified type   Cancer Maternal Uncle        unspecified type   Stroke Paternal Aunt        in mid 22s   Osteoporosis Paternal Aunt    Myasthenia gravis Paternal Aunt    Cancer Maternal  Grandmother        d.82s unspecified GI cancer   Cancer Maternal Grandfather        d.62s unspecified type   Breast cancer Cousin        d.60s-daughter of unaffected paternal aunt Doristine Church   Colon cancer Cousin 60       d.70-daughter of unaffected maternal aunt Leila   Lung cancer Cousin 70       d.70-sisters to each other, both daughters of maternal uncle Johnny   Parkinson's disease Cousin    Diabetes Neg Hx    Heart disease Neg Hx    Stomach cancer Neg Hx    Ulcerative colitis Neg Hx    Scheduled Meds:  amiodarone  100 mg Oral Daily   calcium carbonate  500 mg of elemental calcium Oral Q breakfast   Carbidopa-Levodopa ER  1 tablet Oral QAC supper   Carbidopa-Levodopa ER  2 tablet Oral BID   docusate sodium  100 mg Oral BID   enoxaparin (LOVENOX) injection  40 mg Subcutaneous Q24H   fluticasone furoate-vilanterol  1 puff Inhalation Daily   And   umeclidinium bromide  1 puff Inhalation Daily   furosemide  40 mg Oral Daily   multivitamin with minerals  1 tablet Oral Daily   mupirocin ointment  1 Application Nasal BID   Continuous Infusions: PRN Meds:.acetaminophen, albuterol, bisacodyl, diphenhydrAMINE, HYDROcodone-acetaminophen, HYDROmorphone (DILAUDID) injection, magnesium hydroxide, methocarbamol **OR** methocarbamol (ROBAXIN) injection, metoCLOPramide **OR** metoCLOPramide (REGLAN) injection, ondansetron **OR** ondansetron (ZOFRAN) IV, sodium phosphate Medications Prior to Admission:  Prior to Admission medications   Medication Sig  Start Date End Date Taking? Authorizing Provider  acetaminophen (TYLENOL) 500 MG tablet Take 1,000 mg by mouth 3 (three) times daily.   Yes [provider]  albuterol (VENTOLIN HFA) 108 (90 Base) MCG/ACT inhaler Inhale 2 puffs into the lungs every 6 (six) hours as needed for wheezing or shortness of breath. 06/04/22  Yes Eustaquio Boyden, MD  amiodarone (PACERONE) 100 MG tablet Take 100 mg by mouth daily.   Yes [provider]   apixaban (ELIQUIS) 2.5 MG TABS tablet Take 1 tablet (2.5 mg total) by mouth 2 (two) times daily. 08/27/23  Yes Iran Ouch, MD  calcium carbonate (OSCAL) 1500 (600 Ca) MG TABS tablet Take 600 mg of elemental calcium by mouth daily.   Yes [provider]  calcium carbonate (TUMS - DOSED IN MG ELEMENTAL CALCIUM) 500 MG chewable tablet Chew 2 tablets by mouth daily as needed for indigestion or heartburn.   Yes [provider]  clonazepam (KLONOPIN) 0.125 MG disintegrating tablet Take by mouth at bedtime. 01/15/24  Yes [provider]  clonazePAM (KLONOPIN) 0.5 MG tablet TAKE 0.25 A TABLET = 0.125 mg BY MOUTH AT BEDTIME 01/17/24  Yes Medina-Vargas, Monina C, NP  fluticasone (FLONASE) 50 MCG/ACT nasal spray Place 2 sprays into both nostrils daily as needed. 10/15/23  Yes [provider]  furosemide (LASIX) 40 MG tablet TAKE ONE TABLET BY MOUTH ONCE DAILY *NEW DOSE* 03/29/23  Yes Iran Ouch, MD  magnesium hydroxide (MILK OF MAGNESIA) 400 MG/5ML suspension Take 30 mLs by mouth daily as needed for mild constipation.   Yes [provider]  nitroGLYCERIN (NITROSTAT) 0.4 MG SL tablet Place 0.4 mg under the tongue every 5 (five) minutes as needed for chest pain. 12/30/23  Yes [provider]  pantoprazole (PROTONIX) 20 MG tablet Take 1 tablet (20 mg total) by mouth daily. 04/01/23  Yes Eustaquio Boyden, MD  aluminum-magnesium hydroxide 200-200 MG/5ML suspension Take 10 mLs by mouth every 4 (four) hours as needed for indigestion. Patient not taking: Reported on 02/04/2024    [provider]  bisacodyl (DULCOLAX) 10 MG suppository Place 10 mg rectally as needed for moderate constipation.    [provider]  Carbidopa-Levodopa ER (SINEMET CR) 25-100 MG tablet controlled release 2 at 9am, 2 at 1pm, 1 at 5pm Patient not taking: Reported on 02/10/2024 02/04/24   Tat, Octaviano Batty, DO  Cholecalciferol (VITAMIN D3) 25 MCG (1000 UT) CAPS Take 1  capsule (1,000 Units total) by mouth daily. 09/27/22   Eustaquio Boyden, MD  docusate sodium (COLACE) 100 MG capsule Take 100 mg by mouth 2 (two) times daily.    [provider]  EPINEPHrine 0.3 mg/0.3 mL IJ SOAJ injection Inject 0.3 mg into the muscle as needed for anaphylaxis. 02/25/20   [provider]  fexofenadine (ALLEGRA) 30 MG/5ML suspension Take 5 mLs (30 mg total) by mouth 2 (two) times daily. 01/20/24   Medina-Vargas, Monina C, NP  Fluticasone-Umeclidin-Vilant (TRELEGY ELLIPTA) 100-62.5-25 MCG/ACT AEPB Inhale 1 puff into the lungs daily. 04/27/22   Cobb, Ruby Cola, NP  gabapentin ([name]) 300 MG capsule Take Two capsules by mouth twice daily. Patient not taking: Reported on 02/10/2024 12/19/23   Earnestine Mealing, MD  polyethylene glycol (MIRALAX / GLYCOLAX) 17 g packet Take 17 g by mouth daily as needed.    [provider]   Allergies  Allergen Reactions   Sulfa Antibiotics Anaphylaxis   Sulfonamide Derivatives Anaphylaxis   Topamax [Topiramate] Other (See Comments)    Metabolic  acidosis    Clarithromycin Other (See Comments)    pericarditis  Other reaction(s): Not available   Hydrocodone Other (See Comments)    "hyper and climbing the walls"   Motrin [Ibuprofen] Other (See Comments)    headaches   Misc. Sulfonamide Containing Compounds     Other reaction(s): Not available   Symbicort [Budesonide-Formoterol Fumarate] Other (See Comments)    02/07/15 tremor   Tetracycline Hcl Other (See Comments)   Percocet [Oxycodone-Acetaminophen] Itching and Rash   Tape Itching and Rash    Use paper tape only   Tetracyclines & Related Other (See Comments)    "immediate yeast infection"   Review of Systems  Unable to perform ROS   Physical Exam Constitutional:      Comments: Opens eyes.  Pulmonary:     Effort: Pulmonary effort is normal.     Vital Signs: BP 115/62 (BP Location: Right Arm)   Pulse 65   Temp 97.6 F (36.4 C) (Oral)   Resp 15   Ht 5'  2" (1.575 m)   Wt 57 kg   SpO2 98%   BMI 22.98 kg/m  Pain Scale: 0-10   Pain Score: Asleep   SpO2: SpO2: 98 % O2 Device:SpO2: 98 % O2 Flow Rate: .O2 Flow Rate (L/min): 2 L/min  IO: Intake/output summary:  Intake/Output Summary (Last 24 hours) at 02/11/2024 1457 Last data filed at 02/11/2024 0500 Gross per 24 hour  Intake 14 ml  Output 750 ml  Net -736 ml    LBM: Last BM Date :  (UTA) Baseline Weight: Weight: 57 kg Most recent weight: Weight: 57 kg      Signed by: Morton Stall, NP   Please contact Palliative Medicine Team phone at [phone] for questions and concerns.  For individual provider: See Loretha Stapler

## 2024-02-11 NOTE — [place] FL2 (Signed)
 [place] MEDICAID FL2 LEVEL OF CARE FORM     IDENTIFICATION  Patient Name: MOMOKA STRINGFIELD Birthdate: 1941-07-04 Sex: female Admission Date (Current Location): 02/09/2024  [facility] and [place] Number:      Facility and Address:         Provider Number:    Attending Physician Name and Address:  Delfino Lovett, MD  Relative Name and Phone Number:       Current Level of Care:   Recommended Level of Care:   Prior Approval Number:    Date Approved/Denied:   PASRR Number: [id number]  Discharge Plan: Home    Current Diagnoses: Patient Active Problem List   Diagnosis Date Noted   Protein-calorie malnutrition, severe 02/11/2024   Elevated troponin 02/10/2024   Closed right radial fracture 02/09/2024   Pulmonary embolism and infarction (HCC) 11/13/2023   Atrial fibrillation with rapid ventricular response (HCC) 09/05/2023   Atrial fibrillation with RVR (HCC) 09/04/2023   Sepsis (HCC) 09/04/2023   Acute cystitis 09/02/2023   Atrial fibrillation s/p cardioversion 08/2023 (HCC) 09/02/2023   Daytime somnolence 08/19/2023   Pulmonary nodules 08/09/2023   Frailty 08/09/2023   COVID-19 07/08/2023   Chronic heart failure with preserved ejection fraction (HFpEF) (HCC) 04/04/2023   Hypervolemia 02/08/2023   Acute on chronic heart failure with preserved ejection fraction (HFpEF) (HCC) 08/20/2022   Closed right hip fracture, initial encounter (HCC) 08/19/2022   Severe mitral regurgitation 02/15/2022   S/P mitral valve clip implantation 02/15/2022   Ovarian tumor of borderline malignancy, right 10/28/2020   Angioedema 05/04/2020   Chronic neuropathic pain 01/27/2020   Acute midline low back pain with right-sided sciatica 10/26/2019   [name] (mycobacterium avium-intracellulare) infection (HCC) 11/01/2018   History of pulmonary embolism 2019 10/31/2018   Orthostatic hypotension 08/25/2018   Chronic fatigue 10/23/2017   Parkinson disease (HCC) 01/10/2016   Hypercalcemia  09/14/2013   Osteoporosis, post-menopausal 03/14/2012   Dyspnea on exertion, acute on chronic 03/28/2011   CAP (community acquired pneumonia) 09/27/2009   COPD (chronic obstructive pulmonary disease) (HCC) 03/25/2008   History of left breast cancer 03/10/2008   Essential hypertension 02/10/2007    Orientation RESPIRATION BLADDER Height & Weight     Self, Time, Situation, Place  Normal Continent Weight: 57 kg Height:  5\' 2"  (157.5 cm)  BEHAVIORAL SYMPTOMS/MOOD NEUROLOGICAL BOWEL NUTRITION STATUS      Continent Diet (Regular)  AMBULATORY STATUS COMMUNICATION OF NEEDS Skin   Extensive Assist Verbally Normal, Surgical wounds                       Personal Care Assistance Level of Assistance  Bathing, Feeding, Dressing Bathing Assistance: Limited assistance Feeding assistance: Limited assistance Dressing Assistance: Maximum assistance     Functional Limitations Info  Sight, Hearing, Speech Sight Info: Adequate Hearing Info: Adequate Speech Info: Adequate    SPECIAL CARE FACTORS FREQUENCY  PT (By licensed PT), OT (By licensed OT)     PT Frequency: 5 times per week OT Frequency: 5 times per week            Contractures Contractures Info: Not present    Additional Factors Info  Code Status, Allergies Code Status Info: DNR Limited Allergies Info: Sulfa Antibiotics, Sulfonamide Derivatives, Topamax (Topiramate), Clarithromycin, Hydrocodone, Motrin (Ibuprofen), Misc. Sulfonamide Containing Compounds, Symbicort (Budesonide-formoterol Fumarate), Tetracycline Hcl, Percocet (Oxycodone-acetaminophen), Tape, Tetracyclines & Related           Current Medications (02/11/2024):  This is the current hospital active medication list Current Facility-Administered  Medications  Medication Dose Route Frequency Provider Last Rate Last Admin   acetaminophen (TYLENOL) tablet 325-650 mg  325-650 mg Oral Q6H PRN Poggi, Excell Seltzer, MD       albuterol (PROVENTIL) (2.5 MG/3ML) 0.083%  nebulizer solution 2.5 mg  2.5 mg Inhalation Q6H PRN Poggi, Excell Seltzer, MD       amiodarone (PACERONE) tablet 100 mg  100 mg Oral Daily Poggi, Excell Seltzer, MD       bisacodyl (DULCOLAX) suppository 10 mg  10 mg Rectal Daily PRN Poggi, Excell Seltzer, MD       calcium carbonate (OS-CAL - dosed in mg of elemental calcium) tablet 1,250 mg  500 mg of elemental calcium Oral Q breakfast Poggi, Excell Seltzer, MD       Carbidopa-Levodopa ER (SINEMET CR) 25-100 MG tablet controlled release 1 tablet  1 tablet Oral QAC supper Poggi, Excell Seltzer, MD       Carbidopa-Levodopa ER (SINEMET CR) 25-100 MG tablet controlled release 2 tablet  2 tablet Oral BID Poggi, Excell Seltzer, MD       diphenhydrAMINE (BENADRYL) 12.5 MG/5ML elixir 12.5-25 mg  12.5-25 mg Oral Q4H PRN Poggi, Excell Seltzer, MD       docusate sodium (COLACE) capsule 100 mg  100 mg Oral BID Poggi, Excell Seltzer, MD   100 mg at 02/10/24 2122   enoxaparin (LOVENOX) injection 40 mg  40 mg Subcutaneous Q24H Poggi, Excell Seltzer, MD       fluticasone furoate-vilanterol (BREO ELLIPTA) 100-25 MCG/ACT 1 puff  1 puff Inhalation Daily Poggi, Excell Seltzer, MD       And   umeclidinium bromide (INCRUSE ELLIPTA) 62.5 MCG/ACT 1 puff  1 puff Inhalation Daily Poggi, Excell Seltzer, MD       furosemide (LASIX) tablet 40 mg  40 mg Oral Daily Poggi, Excell Seltzer, MD       HYDROcodone-acetaminophen (NORCO/VICODIN) 5-325 MG per tablet 1-2 tablet  1-2 tablet Oral Q6H PRN Poggi, Excell Seltzer, MD   2 tablet at 02/10/24 2120   HYDROmorphone (DILAUDID) injection 0.5 mg  0.5 mg Intravenous Q2H PRN Poggi, Excell Seltzer, MD   0.5 mg at 02/11/24 1251   magnesium hydroxide (MILK OF MAGNESIA) suspension 30 mL  30 mL Oral Daily PRN Poggi, Excell Seltzer, MD       methocarbamol (ROBAXIN) tablet 500 mg  500 mg Oral Q6H PRN Poggi, Excell Seltzer, MD       Or   methocarbamol (ROBAXIN) injection 500 mg  500 mg Intravenous Q6H PRN Poggi, Excell Seltzer, MD       metoCLOPramide (REGLAN) tablet 5-10 mg  5-10 mg Oral Q8H PRN Poggi, Excell Seltzer, MD       Or   metoCLOPramide (REGLAN) injection 5-10 mg  5-10  mg Intravenous Q8H PRN Poggi, Excell Seltzer, MD       multivitamin with minerals tablet 1 tablet  1 tablet Oral Daily Sherryll Burger, Vipul, MD       mupirocin ointment (BACTROBAN) 2 % 1 Application  1 Application Nasal BID Poggi, Excell Seltzer, MD   1 Application at 02/10/24 2122   ondansetron (ZOFRAN) tablet 4 mg  4 mg Oral Q6H PRN Poggi, Excell Seltzer, MD       Or   ondansetron (ZOFRAN) injection 4 mg  4 mg Intravenous Q6H PRN Poggi, Excell Seltzer, MD       sodium phosphate (FLEET) enema 1 enema  1 enema Rectal Once PRN Poggi, Excell Seltzer, MD  Discharge Medications: Please see discharge summary for a list of discharge medications.  Relevant Imaging Results:  Relevant Lab Results:   Additional Information SSN: [id number]  Marlowe Sax, RN

## 2024-02-11 NOTE — Progress Notes (Signed)
 PHARMACY - ANTICOAGULATION CONSULT NOTE  Pharmacy Consult for Apixaban Indication: atrial fibrillation  Allergies  Allergen Reactions   Sulfa Antibiotics Anaphylaxis   Sulfonamide Derivatives Anaphylaxis   Topamax [Topiramate] Other (See Comments)    Metabolic acidosis    Clarithromycin Other (See Comments)    pericarditis  Other reaction(s): Not available   Hydrocodone Other (See Comments)    "hyper and climbing the walls"   Motrin [Ibuprofen] Other (See Comments)    headaches   Misc. Sulfonamide Containing Compounds     Other reaction(s): Not available   Symbicort [Budesonide-Formoterol Fumarate] Other (See Comments)    02/07/15 tremor   Tetracycline Hcl Other (See Comments)   Percocet [Oxycodone-Acetaminophen] Itching and Rash   Tape Itching and Rash    Use paper tape only   Tetracyclines & Related Other (See Comments)    "immediate yeast infection"    Patient Measurements: Height: 5\' 2"  (157.5 cm) Weight: 57 kg (125 lb 10.6 oz) IBW/kg (Calculated) : 50.1 Heparin Dosing Weight: -  Vital Signs: Temp: 97.6 F (36.4 C) (03/18 0819) Temp Source: Oral (03/18 0819) BP: 115/62 (03/18 0819) Pulse Rate: 65 (03/18 0819)  Labs: Recent Labs    02/09/24 1654 02/09/24 2100 02/09/24 2312 02/10/24 0855 02/11/24 0354 02/11/24 1335  HGB 12.9  --   --  12.6 9.3* 9.3*  HCT 39.8  --   --  39.2 28.7*  --   PLT 158  --   --  138* 109*  --   APTT  --  31  --   --   --   --   LABPROT  --  15.5*  --   --   --   --   INR  --  1.2  --   --   --   --   CREATININE 1.06*  --   --  0.81 1.14*  --   TROPONINIHS  --  87* 152*  --   --   --     Estimated Creatinine Clearance: 31.1 mL/min (A) (by C-G formula based on SCr of 1.14 mg/dL (H)).   Medical History: Past Medical History:  Diagnosis Date   Aneurysm of aorta (HCC)    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   Anginal pain (HCC)    a. 10/2021 Cath: Nl cors.   Arthritis    Asthma    Breast cancer (HCC) 2002   infiltrative  ductal carcinoma    Cataract 2019   Chronic heart failure with preserved ejection fraction (HFpEF) (HCC)    COPD (chronic obstructive pulmonary disease) (HCC) 07/2007   Coronary artery disease    Ductal carcinoma of breast, estrogen receptor positive, stage 1 (HCC) 09/16/2012   Endometriosis    GERD (gastroesophageal reflux disease)    Hx of mitral valve repair    Hypercalcemia 09/14/2013   Hypertension    Lesion of breast 1992   right, benign   Lung disease    secondary to [name] infection   Mastalgia 05/1994   Mitral regurgitation    a. 10/2021 RHC: PCWP V wave of 34; b. 01/2022 TEE: Severe flail/prolapse of large area of A2 scallop with severe posteriorly directed MR; c. 01/2022 TEER w /Mitraclip XTW x 2; d. 07/2022 Echo: EF 60-65%. No rwma, mild LVH, GrIII DD. Sev dil LA. Mod elev PASP. Mod-Sev MR. Mod MS.   Moderate mitral regurgitation by prior echocardiogram    Osteoporosis, post-menopausal 03/14/2012   Ovarian tumor of borderline malignancy, right 2004  Palpitations    a. 02/2022 Zio: Predominantly sinus rhythm at 67 (52-171).  2 runs of NSVT -fastest 9 beats at 167.  35 SVT runs-longest 19 beats, fastest 171x6 beats.   Pancreatitis    secondary to Cholelithiasis   Parkinson disease (HCC) 02/10/2015   Post-thoracotomy pain syndrome    Pseudomonas pneumonia (HCC) 03/2008   Status post implantation of mitral valve leaflet clip 02/15/2022   s/p XTW MitraClip x2 by Dr. Excell Seltzer in the A2/P2: b. 04/03/23 s/p third mitraclip implantation.   Traumatic closed fracture of distal clavicle with minimal displacement, left, initial encounter 06/2021   [facility]   Uterine fibroid    Vitamin D deficiency     Medications:  Apixaban 2.5 mg BID PTA Enoxaparin 40 mg SQ daily for DVT ppx   Assessment: Patient is a 83 year old female with a  past medical history of Parkinson's disease, COPD/bronchiectasis, mitral regurgitation s/p MitraClip in 2023 with redo 03/2023, diastolic CHF history of PE in  2019, and A-fib in October s/p cardioversion, on Eliquis and amiodarone. Apixaban originally held due to close right hip fracture requiring surgery on 3/17.   Home dose: Apixaban 2.5 mg bid Dose appropriate? Yes (Age 83, weight 57 kg)  Hgb 9.3. PLT 138>109.  Goal of Therapy:  Monitor platelets by anticoagulation protocol: Yes   Plan:  Restart home apixaban 2.5 mg twice daily  Monitor CBC and SCr weekly unless more frequent monitoring is needed  Merryl Hacker, PharmD Clinical Pharmacist  02/11/2024,4:55 PM

## 2024-02-11 NOTE — Plan of Care (Signed)
  Problem: Education: Goal: Knowledge of General Education information will improve Description: Including pain rating scale, medication(s)/side effects and non-pharmacologic comfort measures Outcome: Progressing   Problem: Health Behavior/Discharge Planning: Goal: Ability to manage health-related needs will improve Outcome: Not Progressing   Problem: Clinical Measurements: Goal: Ability to maintain clinical measurements within normal limits will improve Outcome: Progressing Goal: Will remain free from infection Outcome: Progressing Goal: Diagnostic test results will improve Outcome: Progressing Goal: Respiratory complications will improve Outcome: Progressing Goal: Cardiovascular complication will be avoided Outcome: Progressing   Problem: Activity: Goal: Risk for activity intolerance will decrease Outcome: Not Progressing   Problem: Nutrition: Goal: Adequate nutrition will be maintained Outcome: Progressing   Problem: Coping: Goal: Level of anxiety will decrease Outcome: Progressing   Problem: Elimination: Goal: Will not experience complications related to bowel motility Outcome: Progressing Goal: Will not experience complications related to urinary retention Outcome: Progressing   Problem: Pain Managment: Goal: General experience of comfort will improve and/or be controlled Outcome: Progressing   Problem: Safety: Goal: Ability to remain free from injury will improve Outcome: Progressing   Problem: Skin Integrity: Goal: Risk for impaired skin integrity will decrease Outcome: Progressing

## 2024-02-11 NOTE — Evaluation (Signed)
 Physical Therapy Evaluation Patient Details Name: Crystal Bass MRN: [id number] DOB: [birth year] Today's Date: 02/11/2024  History of Present Illness  Pt is an 83 y.o. female with medical history significant for Parkinson's disease, COPD/bronchiectasis, mitral regurgitation s/p MitraClip in 2023 with redo 03/2023, diastolic CHF history of PE in 0350, A-fib in October s/p cardioversion being admitted with an accidental fall in which she sustained a right intertrochanteric fracture and a buckle fracture of the right distal radius. Radius fracture to be managed conservatively with pt now s/p reduction and internal fixation of R hip fracture with TFN nail.   Clinical Impression  Pt alert but presented with very little verbal communication, especially initially.  Pt required total assist with bed mobility tasks but once sitting at the EOB was able to maintain static sitting position without assist.  Pt able to volitionally move her right hand/fingers occasionally and less often her right foot/knee but only minimally.  Pt demonstrated no ability to move her LUE or LLE and was noted to be in full ankle PF and in hip adduction to midline with no ability to DF her ankle or abd her hip either actively or passively.  Pt also began communicating more near the end of the session but mostly consisted of repeating "I just want to go".  MD and nursing notified of physical and psychological concerns per above. Will make pt frequency 3x/wk for now due to pt's seeming desire not to participate with PT services but will follow closely for adjusting frequency as appropriate.  Pt will benefit from a trial of continued PT services upon discharge to safely address deficits listed in patient problem list for decreased caregiver assistance and eventual return to PLOF.            If plan is discharge home, recommend the following: Two people to help with walking and/or transfers;A lot of help with  bathing/dressing/bathroom;Assistance with cooking/housework;Assistance with feeding;Direct supervision/assist for medications management;Assist for transportation   Can travel by private vehicle   No    Equipment Recommendations Other (comment) (TBD at next venue of care)  Recommendations for Other Services       Functional Status Assessment Patient has had a recent decline in their functional status and/or demonstrates limited ability to make significant improvements in function in a reasonable and predictable amount of time     Precautions / Restrictions Precautions Precautions: Fall Required Braces or Orthoses: Splint/Cast Splint/Cast: R wrist Restrictions Weight Bearing Restrictions Per Provider Order: Yes RUE Weight Bearing Per Provider Order: Non weight bearing RLE Weight Bearing Per Provider Order: Weight bearing as tolerated Other Position/Activity Restrictions: OK for RUE PF walker      Mobility  Bed Mobility Overal bed mobility: Needs Assistance Bed Mobility: Supine to Sit, Sit to Supine     Supine to sit: Total assist, +2 for physical assistance Sit to supine: Total assist, +2 for physical assistance   General bed mobility comments: Pt did not assist with bed mobility tasks    Transfers                   General transfer comment: Unable/unsafe to attempt    Ambulation/Gait                  Stairs            Wheelchair Mobility     Tilt Bed    Modified Rankin (Stroke Patients Only)       Balance Overall balance assessment: Needs  assistance, History of Falls Sitting-balance support: Single extremity supported Sitting balance-Leahy Scale: Fair         Standing balance comment: Unable to assess                             Pertinent Vitals/Pain Pain Assessment Pain Assessment: PAINAD Breathing: normal Negative Vocalization: occasional moan/groan, low speech, negative/disapproving quality Facial Expression:  sad, frightened, frown Body Language: tense, distressed pacing, fidgeting Consolability: distracted or reassured by voice/touch PAINAD Score: 4 Pain Intervention(s): Premedicated before session, Monitored during session    Home Living Family/patient expects to be discharged to:: Skilled nursing facility                        Prior Function Prior Level of Function : Patient poor historian/Family not available             Mobility Comments: Per chart review from October 2024 pt was living in a facility and able to amb facility distances with a rollator ADLs Comments: Per chart review from October 2024 pt required assist with ADLs from facility staff     Extremity/Trunk Assessment   Upper Extremity Assessment Upper Extremity Assessment: Difficult to assess due to impaired cognition;Defer to OT evaluation    Lower Extremity Assessment Lower Extremity Assessment: Difficult to assess due to impaired cognition;LLE deficits/detail;RLE deficits/detail RLE: Unable to fully assess due to pain LLE Deficits / Details: Noted tone into PF and hip adduction on the LLE       Communication   Communication Factors Affecting Communication: Difficulty expressing self    Cognition Arousal: Lethargic Behavior During Therapy: Flat affect   PT - Cognitive impairments: No family/caregiver present to determine baseline                       PT - Cognition Comments: Limited communication verbally Following commands: Impaired       Cueing       General Comments      Exercises     Assessment/Plan    PT Assessment Patient needs continued PT services  PT Problem List Decreased strength;Decreased activity tolerance;Decreased balance;Decreased range of motion;Decreased mobility;Decreased knowledge of use of DME;Decreased knowledge of precautions;Pain       PT Treatment Interventions DME instruction;Gait training;Functional mobility training;Therapeutic  activities;Therapeutic exercise;Balance training;Patient/family education    PT Goals (Current goals can be found in the Care Plan section)  Acute Rehab PT Goals PT Goal Formulation: Patient unable to participate in goal setting Time For Goal Achievement: 02/24/24 Potential to Achieve Goals: Fair    Frequency Min 3X/week     Co-evaluation PT/OT/SLP Co-Evaluation/Treatment: Yes Reason for Co-Treatment: Necessary to address cognition/behavior during functional activity;For patient/therapist safety;To address functional/ADL transfers PT goals addressed during session: Mobility/safety with mobility;Balance         AM-PAC PT "6 Clicks" Mobility  Outcome Measure Help needed turning from your back to your side while in a flat bed without using bedrails?: Total Help needed moving from lying on your back to sitting on the side of a flat bed without using bedrails?: Total Help needed moving to and from a bed to a chair (including a wheelchair)?: Total Help needed standing up from a chair using your arms (e.g., wheelchair or bedside chair)?: Total Help needed to walk in hospital room?: Total Help needed climbing 3-5 steps with a railing? : Total 6 Click Score: 6  End of Session   Activity Tolerance: Patient tolerated treatment well Patient left: in bed;with call bell/phone within reach;with bed alarm set Nurse Communication: Mobility status;Other (comment) (See above) PT Visit Diagnosis: History of falling (Z91.81);Other abnormalities of gait and mobility (R26.89);Muscle weakness (generalized) (M62.81);Pain Pain - Right/Left: Right Pain - part of body: Hip;Hand    Time: 3086-5784 PT Time Calculation (min) (ACUTE ONLY): 38 min   Charges:   PT Evaluation $PT Eval Moderate Complexity: 1 Mod   PT General Charges $$ ACUTE PT VISIT: 1 Visit    D. Scott Anakin Varkey PT, DPT 02/11/24, 12:22 PM

## 2024-02-11 NOTE — Progress Notes (Signed)
 Subjective: 1 Day Post-Op Procedure(s) (LRB): FIXATION, FRACTURE, INTERTROCHANTERIC, WITH INTRAMEDULLARY ROD (Right) Right distal radius fracture, being treated non-op Patient reports pain as mild.   Patient is well, and has had no acute complaints or problems PT and care management to assist with discharge planning. Negative for chest pain and shortness of breath Fever: no Gastrointestinal:Negative for nausea and vomiting this AM.  Objective: Vital signs in last 24 hours: Temp:  [97.4 F (36.3 C)-99.8 F (37.7 C)] 97.4 F (36.3 C) (03/18 0429) Pulse Rate:  [59-65] 60 (03/18 0429) Resp:  [12-24] 20 (03/18 0429) BP: (91-171)/(51-85) 101/56 (03/18 0429) SpO2:  [90 %-97 %] 97 % (03/18 0429) Weight:  [57 kg] 57 kg (03/17 1155)  Intake/Output from previous day:  Intake/Output Summary (Last 24 hours) at 02/11/2024 0711 Last data filed at 02/11/2024 0500 Gross per 24 hour  Intake 1014 ml  Output 1250 ml  Net -236 ml    Intake/Output this shift: No intake/output data recorded.  Labs: Recent Labs    02/09/24 1654 02/10/24 0855 02/11/24 0354  HGB 12.9 12.6 9.3*   Recent Labs    02/10/24 0855 02/11/24 0354  WBC 11.1* 9.4  RBC 3.95 2.89*  HCT 39.2 28.7*  PLT 138* 109*   Recent Labs    02/10/24 0855 02/11/24 0354  NA 141 136  K 4.3 4.2  CL 102 98  CO2 27 29  BUN 22 31*  CREATININE 0.81 1.14*  GLUCOSE 119* 143*  CALCIUM 9.1 9.0   Recent Labs    02/09/24 2100  INR 1.2     EXAM General - Patient is Alert and Appropriate.  Able to answer my questions but does take awhile to respond. Extremity - Right wrist splint intact.  Able to flex and extend fingers.  Towel applied to elevate the right wrist. Honeycomb dressings intact without drainage.  Right thigh is soft. Able to dorsiflex and plantarflex the right foot. Dressing/Incision - clean, dry, no drainage Motor Function - intact, moving foot and toes well on exam.   Past Medical History:  Diagnosis Date    Aneurysm of aorta (HCC)    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   Anginal pain (HCC)    a. 10/2021 Cath: Nl cors.   Arthritis    Asthma    Breast cancer (HCC) 2002   infiltrative ductal carcinoma    Cataract 2019   Chronic heart failure with preserved ejection fraction (HFpEF) (HCC)    COPD (chronic obstructive pulmonary disease) (HCC) 07/2007   Coronary artery disease    Ductal carcinoma of breast, estrogen receptor positive, stage 1 (HCC) 09/16/2012   Endometriosis    GERD (gastroesophageal reflux disease)    Hx of mitral valve repair    Hypercalcemia 09/14/2013   Hypertension    Lesion of breast 1992   right, benign   Lung disease    secondary to [name] infection   Mastalgia 05/1994   Mitral regurgitation    a. 10/2021 RHC: PCWP V wave of 34; b. 01/2022 TEE: Severe flail/prolapse of large area of A2 scallop with severe posteriorly directed MR; c. 01/2022 TEER w /Mitraclip XTW x 2; d. 07/2022 Echo: EF 60-65%. No rwma, mild LVH, GrIII DD. Sev dil LA. Mod elev PASP. Mod-Sev MR. Mod MS.   Moderate mitral regurgitation by prior echocardiogram    Osteoporosis, post-menopausal 03/14/2012   Ovarian tumor of borderline malignancy, right 2004   Palpitations    a. 02/2022 Zio: Predominantly sinus rhythm at 67 (  52-171).  2 runs of NSVT -fastest 9 beats at 167.  35 SVT runs-longest 19 beats, fastest 171x6 beats.   Pancreatitis    secondary to Cholelithiasis   Parkinson disease (HCC) 02/10/2015   Post-thoracotomy pain syndrome    Pseudomonas pneumonia (HCC) 03/2008   Status post implantation of mitral valve leaflet clip 02/15/2022   s/p XTW MitraClip x2 by Dr. Excell Seltzer in the A2/P2: b. 04/03/23 s/p third mitraclip implantation.   Traumatic closed fracture of distal clavicle with minimal displacement, left, initial encounter 06/2021   [facility]   Uterine fibroid    Vitamin D deficiency     Assessment/Plan: 1 Day Post-Op Procedure(s) (LRB): FIXATION, FRACTURE, INTERTROCHANTERIC, WITH  INTRAMEDULLARY ROD (Right) Principal Problem:   Closed right hip fracture, initial encounter [facility]) Active Problems:   History of pulmonary embolism 2019   Chronic heart failure with preserved ejection fraction (HFpEF) (HCC)   Atrial fibrillation s/p cardioversion 08/2023 [facility])   Closed right radial fracture   Elevated troponin  Estimated body mass index is 22.98 kg/m as calculated from the following:   Height as of this encounter: 5\' 2"  (1.575 m).   Weight as of this encounter: 57 kg. Advance diet Up with therapy D/C IV fluids when tolerating po intake.  Labs and vitals reviewed. Hg down to 9.3, will recheck at noon.  Platelets 109.  HR 60 Up with therapy today.  Will likely need SNF.  Will need to use a platform walker and remain NWB to the right arm. Continue to work on a BM.  DVT Prophylaxis - Lovenox and TED hose Weight-Bearing as tolerated to right leg NWB to the right arm.  Valeria Batman, PA-C [facility] Orthopaedic Surgery 02/11/2024, 7:11 AM

## 2024-02-11 NOTE — Progress Notes (Signed)
 Initial Nutrition Assessment  DOCUMENTATION CODES:   Severe malnutrition in context of chronic illness  INTERVENTION:   -Liberalize diet to regular for wider variety of meal selections -MVI with minerals daily -Magic cup TID with meals, each supplement provides 290 kcal and 9 grams of protein  -Feeding assistance with meals -Palliative care consult pending for goals of care; RD will make further recommendations once goals of care are established  NUTRITION DIAGNOSIS:   Severe Malnutrition related to chronic illness (COPD) as evidenced by moderate fat depletion, severe fat depletion, moderate muscle depletion, severe muscle depletion.  GOAL:   Patient will meet greater than or equal to 90% of their needs  MONITOR:   PO intake, Supplement acceptance  REASON FOR ASSESSMENT:   Consult Assessment of nutrition requirement/status  ASSESSMENT:   Pt with medical history significant for Parkinson's disease, COPD/bronchiectasis, mitral regurgitation s/p MitraClip in 2023 with redo 03/2023, diastolic CHF history of PE in 9562, A-fib in October s/p cardioversion, on Eliquis and amiodarone  being admitted with an accidental fall fall in which she sustained a right intertrochanteric fracture and a buckle fracture of the right distal radius.  Pt admitted with rt hip fracture.   3/17- s/p Reduction and internal fixation of placed intertrochanteric right hip fracture with [employer] TFN nail  Reviewed I/O's: -436 ml x 24 hours  UOP: 950 ml x 24 hours  Spoke with pt at bedside, who was sitting up in bed at time of visit. Pt reports feeling "okay, I guess" and states she slept well last night. Pt reports that she was eating well PTA, however, unable to provide any meaningful history. Pt responded "yeah" to most questions RD asked. No family present to provide additional history.   No wt loss noted over the past 3 months. Pt unsure of UBW, but does not think she has lost weight.   Noted  pt refused AM medications. Palliative care following for goals of care discussions, which RD agrees with. Noted pt has reported that she does not like nutritional shakes, such as Ensure, in the past.   Medications reviewed and include calcium carbonate, sinemet, lovenox, colace, and lasix.   Labs reviewed.   Diet Order:   Diet Order             Diet regular Room service appropriate? Yes; Fluid consistency: Thin  Diet effective now                   EDUCATION NEEDS:   No education needs have been identified at this time  Skin:  Skin Assessment: Skin Integrity Issues: Skin Integrity Issues:: Incisions Incisions: closed rt thigh  Last BM:  Unknown  Height:   Ht Readings from Last 1 Encounters:  02/10/24 5\' 2"  (1.575 m)    Weight:   Wt Readings from Last 1 Encounters:  02/10/24 57 kg    Ideal Body Weight:  50 kg  BMI:  Body mass index is 22.98 kg/m.  Estimated Nutritional Needs:   Kcal:  1700-1900  Protein:  85-100 grams  Fluid:  > 1.7 L    Levada Schilling, RD, LDN, CDCES Registered Dietitian III Certified Diabetes Care and Education Specialist If unable to reach this RD, please use "RD Inpatient" group chat on secure chat between hours of 8am-4 pm daily

## 2024-02-11 NOTE — Evaluation (Signed)
 Occupational Therapy Evaluation Patient Details Name: Crystal Bass MRN: [id number] DOB: [birth year] Today's Date: 02/11/2024   History of Present Illness   Pt is an 83 y.o. female with medical history significant for Parkinson's disease, COPD/bronchiectasis, mitral regurgitation s/p MitraClip in 2023 with redo 03/2023, diastolic CHF history of PE in 8657, A-fib in October s/p cardioversion being admitted with an accidental fall in which she sustained a right intertrochanteric fracture and a buckle fracture of the right distal radius. Radius fracture to be managed conservatively with pt now s/p reduction and internal fixation of R hip fracture with TFN nail.     Clinical Impressions Pt seen for OT/PT co-eval to maximize therapeutic outcomes and for pt / therapist safety. Pt alert, visually tracking in all quadrants, initially with little to no verbal communication. Pt noted to have some volitional movement of R hand/fingers and minimal movement to R foot. L side noted to have increased rigidity and pt unable to activate LUE or LLE, presents with ankle in plantar flexion and unable to passively abduct L hip. Towards end of session, pt began communicating more, "Just let me go" and other variations of same phase repetitively. MD and RN notified of concerns. At this time, pt requires TOTAL +2-3 assist for bed mobility and will require TOTAL assist for all ADLs. OT will place pt on trial to see if pt able to participate and make functional gains. Pt would benefit from skilled OT services to address noted impairments and functional limitations (see below for any additional details) in order to maximize safety and independence while minimizing falls risk and caregiver burden.      If plan is discharge home, recommend the following:   Two people to help with walking and/or transfers;Two people to help with bathing/dressing/bathroom;Help with stairs or ramp for entrance;Supervision due to cognitive  status     Functional Status Assessment   Patient has had a recent decline in their functional status and demonstrates the ability to make significant improvements in function in a reasonable and predictable amount of time.     Equipment Recommendations   None recommended by OT (defer)      Precautions/Restrictions   Precautions Precautions: Fall Recall of Precautions/Restrictions: Impaired Required Braces or Orthoses: Splint/Cast Splint/Cast: R wrist Restrictions Weight Bearing Restrictions Per Provider Order: Yes RUE Weight Bearing Per Provider Order: Non weight bearing RLE Weight Bearing Per Provider Order: Weight bearing as tolerated Other Position/Activity Restrictions: OK for RUE PF walker     Mobility Bed Mobility Overal bed mobility: Needs Assistance Bed Mobility: Supine to Sit, Sit to Supine     Supine to sit: Total assist, +2 for physical assistance Sit to supine: Total assist, +2 for physical assistance   General bed mobility comments: Pt did not provide physical assist or attempt to initiate tasks.    Transfers                   General transfer comment: Unsafe to attempt      Balance Overall balance assessment: Needs assistance, History of Falls Sitting-balance support: Single extremity supported Sitting balance-Leahy Scale: Fair Sitting balance - Comments: once EOB, able to maintain balance with SUP for a few seconds. RUE positioned on pillow                                   ADL either performed or assessed with clinical judgement   ADL Overall  ADL's : Needs assistance/impaired                                       General ADL Comments: +3 TOTAL assist required to sit EOB, pt will require TOTAL with all BADLs including feeding and grooming.      Pertinent Vitals/Pain Pain Assessment Pain Assessment: PAINAD Breathing: normal Negative Vocalization: occasional moan/groan, low speech,  negative/disapproving quality Facial Expression: sad, frightened, frown Body Language: tense, distressed pacing, fidgeting Consolability: distracted or reassured by voice/touch PAINAD Score: 4 Pain Intervention(s): Premedicated before session     Extremity/Trunk Assessment Upper Extremity Assessment Upper Extremity Assessment: Difficult to assess due to impaired cognition (little volitional movement of either UE, does not follow commands for full assessment, pt resisting PROM)   Lower Extremity Assessment Lower Extremity Assessment: Difficult to assess due to impaired cognition RLE: Unable to fully assess due to pain LLE Deficits / Details: Noted tone into PF and hip adduction on the LLE       Communication Communication Communication: Impaired Factors Affecting Communication: Difficulty expressing self   Cognition Arousal: Lethargic Behavior During Therapy: Flat affect Cognition: No family/caregiver present to determine baseline             OT - Cognition Comments: Pt with minimal verbalization, unable to state name, repeating "just let me go"                 Following commands: Impaired Following commands impaired: Follows one step commands inconsistently     Cueing  General Comments   Cueing Techniques: Verbal cues;Tactile cues;Gestural cues  Secure chat to MD, RN regarding physical/cognitive status           Home Living Family/patient expects to be discharged to:: Skilled nursing facility                                        Prior Functioning/Environment Prior Level of Function : Patient poor historian/Family not available             Mobility Comments: Per chart review from October 2024 pt was living in a facility and able to amb facility distances with a rollator ADLs Comments: Per chart review from October 2024 pt required assist with ADLs from facility staff    OT Problem List: Decreased strength;Decreased range of  motion;Decreased activity tolerance;Impaired balance (sitting and/or standing);Impaired tone;Impaired UE functional use;Pain;Decreased knowledge of precautions;Decreased knowledge of use of DME or AE;Decreased safety awareness;Decreased cognition;Decreased coordination   OT Treatment/Interventions: Self-care/ADL training;Therapeutic exercise;Neuromuscular education;Energy conservation;DME and/or AE instruction;Therapeutic activities;Patient/family education;Cognitive remediation/compensation;Balance training      OT Goals(Current goals can be found in the care plan section)   Acute Rehab OT Goals OT Goal Formulation: Patient unable to participate in goal setting Time For Goal Achievement: 02/25/24 Potential to Achieve Goals: Poor   OT Frequency:  Min 3X/week    Co-evaluation PT/OT/SLP Co-Evaluation/Treatment: Yes Reason for Co-Treatment: Necessary to address cognition/behavior during functional activity;For patient/therapist safety;To address functional/ADL transfers PT goals addressed during session: Mobility/safety with mobility;Balance OT goals addressed during session: ADL's and self-care      AM-PAC OT "6 Clicks" Daily Activity     Outcome Measure Help from another person eating meals?: Total Help from another person taking care of personal grooming?: Total Help from another person toileting, which includes  using toliet, bedpan, or urinal?: Total Help from another person bathing (including washing, rinsing, drying)?: Total Help from another person to put on and taking off regular upper body clothing?: Total Help from another person to put on and taking off regular lower body clothing?: Total 6 Click Score: 6   End of Session Nurse Communication: Mobility status;Precautions;Weight bearing status  Activity Tolerance: Patient limited by lethargy Patient left: in bed;with call bell/phone within reach;with SCD's reapplied;with bed alarm set  OT Visit Diagnosis: History of  falling (Z91.81);Unsteadiness on feet (R26.81);Other abnormalities of gait and mobility (R26.89);Pain Pain - Right/Left: Right Pain - part of body: Hip;Hand;Arm;Shoulder                Time: 8657-8469 OT Time Calculation (min): 36 min Charges:  OT General Charges $OT Visit: 1 Visit OT Evaluation $OT Eval Moderate Complexity: 1 Mod Sandor Arboleda L. Verona Hartshorn, OTR/L  02/11/24, 1:59 PM

## 2024-02-11 NOTE — Progress Notes (Addendum)
 1      PROGRESS NOTE    Crystal Bass  VZD:[id number] DOB: [birth year] DOA: 02/09/2024 PCP: Crystal Mealing, MD    Brief Narrative:   83 y.o. female with medical history significant for Parkinson's disease, COPD/bronchiectasis, mitral regurgitation s/p MitraClip in 2023 with redo 03/2023, diastolic CHF history of PE in 2951, A-fib in October s/p cardioversion, on Eliquis and amiodarone  being admitted with an accidental fall fall in which she sustained a right intertrochanteric fracture and a buckle fracture of the right distal radius.  Dr. Joice Lofts is aware and will take patient to the OR on 3/17   3/17: Cardiology for preop clearance. S/p hip surgery 3/18: palliative care consult for goals of care   Assessment & Plan:   Principal Problem:   Closed right hip fracture, initial encounter [facility]) Active Problems:   Chronic heart failure with preserved ejection fraction (HFpEF) (HCC)   Atrial fibrillation s/p cardioversion 08/2023 [facility])   Elevated troponin   History of pulmonary embolism 2019   Closed right radial fracture   Protein-calorie malnutrition, severe  * Closed right hip fracture, initial encounter (HCC) Secondary to accidental fall S/p FIXATION, FRACTURE, INTERTROCHANTERIC, WITH INTRAMEDULLARY ROD (Right) on 3/17 DVT prophylaxis and pain management per orthopedics   Atrial fibrillation s/p cardioversion 08/2023 [facility]) Resume eliquis, stop Lovenox Continue amiodarone   Chronic heart failure with preserved ejection fraction (HFpEF) (HCC) Chest x-ray suggesting pulmonary vascular congestion, patient appears clinically overloaded on admission Euvolemic today EF 50 to 55% on TEE 08/2023 with indeterminate diastolic parameters Cardiology has seen and cleared her for planned surgery.-->trop 87, BNP 467, EKG with NSR at 66 with biventricular hypertrophy respiratory viral panel negative.  Procalcitonin was less than 0.1   Elevated troponin Troponin 87-152 likely due to demand  ischemia Patient denies chest pain and EKG with no ischemic changes Echocardiogram shows hyperdynamic LV systolic function, moderate pulmonary hypertension and moderate mitral regurgitation   Closed right radial fracture Buckle fracture.  Manage per orthopedics   COPD (chronic obstructive pulmonary disease) (HCC) Continue home inhalers DuoNebs if needed   History of pulmonary embolism Resume Eliquis today    S/P mitral valve clip implantation S/p MitraClip's x 2 due to inadequate symptomatic improvement after first MitraClip in 2023 No acute issues suspected   Parkinson disease Continue Sinemet.   Frailty PT recommends SNF Ambulates with rollator/walker at baseline .  Had prior left hip fracture   [facility] Palliative care eval, I've had several discussions with son and niece - they're not interested in Hospice and feels this is transient.   DVT prophylaxis: Eliquis enoxaparin (LOVENOX) injection 40 mg Start: 02/11/24 0800 SCDs Start: 02/10/24 1634     Code Status: DNR Family Communication: "discussed with patient". Updated patient's brother Crystal Bass [facility]) over phone.  Disposition Plan: Possible discharge in next 1-2 days depending on clinical condition and postop recovery, will need SNF   Consultants:  Orthopedics  Procedures:  Hip fracture surgery on 3/17    Subjective:  No new issues, weak  Objective: Vitals:   02/10/24 1943 02/10/24 2353 02/11/24 0429 02/11/24 0819  BP: (!) 103/55 (!) 108/54 (!) 101/56 115/62  Pulse: 63 (!) 59 60 65  Resp: 16 16 20 15   Temp: 98.2 F (36.8 C) 97.8 F (36.6 C) (!) 97.4 F (36.3 C) 97.6 F (36.4 C)  TempSrc: Axillary Oral  Oral  SpO2: 93% 95% 97% 98%  Weight:      Height:        Intake/Output Summary (  Last 24 hours) at 02/11/2024 1643 Last data filed at 02/11/2024 0500 Gross per 24 hour  Intake --  Output 750 ml  Net -750 ml   Filed Weights   02/09/24 1649 02/10/24 1155  Weight: 57 kg 57 kg     Examination: 83 year old cachectic looking female General exam: Appears calm and comfortable  Respiratory system: Clear to auscultation. Respiratory effort normal. Cardiovascular system: S1 & S2 heard, RRR. No JVD, murmurs, rubs, gallops or clicks. No pedal edema. Gastrointestinal system: Abdomen is soft, benign Central nervous system: awake, alert. No focal neurological deficits. Extremities: Tenderness at the right hip area.  Right wrist in the Velcro wrist immobilizer Skin: No rashes, lesions or ulcers Psychiatry: Judgement and insight appear normal. Mood & affect appropriate.     Data Reviewed: I have personally reviewed following labs and imaging studies  CBC: Recent Labs  Lab 02/09/24 1654 02/10/24 0855 02/11/24 0354 02/11/24 1335  WBC 6.5 11.1* 9.4  --   NEUTROABS 4.3  --   --   --   HGB 12.9 12.6 9.3* 9.3*  HCT 39.8 39.2 28.7*  --   MCV 99.5 99.2 99.3  --   PLT 158 138* 109*  --    Basic Metabolic Panel: Recent Labs  Lab 02/09/24 1654 02/10/24 0855 02/11/24 0354  NA 137 141 136  K 3.9 4.3 4.2  CL 98 102 98  CO2 28 27 29   GLUCOSE 107* 119* 143*  BUN 19 22 31*  CREATININE 1.06* 0.81 1.14*  CALCIUM 9.7 9.1 9.0   GFR: Estimated Creatinine Clearance: 31.1 mL/min (A) (by C-G formula based on SCr of 1.14 mg/dL (H)).  Coagulation Profile: Recent Labs  Lab 02/09/24 2100  INR 1.2   Cardiac Enzymes: No results for input(s): "CKTOTAL", "CKMB", "CKMBINDEX", "TROPONINI" in the last 168 hours. BNP (last 3 results) Recent Labs    08/05/23 1114  PROBNP 484.0*    Sepsis Labs: Recent Labs  Lab 02/09/24 1654  PROCALCITON <0.10    Recent Results (from the past 240 hours)  Resp panel by RT-PCR (RSV, Flu A&B, Covid) Anterior Nasal Swab     Status: None   Collection Time: 02/09/24  9:00 PM   Specimen: Anterior Nasal Swab  Result Value Ref Range Status   SARS Coronavirus 2 by RT PCR NEGATIVE NEGATIVE Final    Comment: (NOTE) SARS-CoV-2 target nucleic  acids are NOT DETECTED.  The SARS-CoV-2 RNA is generally detectable in upper respiratory specimens during the acute phase of infection. The lowest concentration of SARS-CoV-2 viral copies this assay can detect is 138 copies/mL. A negative result does not preclude SARS-Cov-2 infection and should not be used as the sole basis for treatment or other patient management decisions. A negative result may occur with  improper specimen collection/handling, submission of specimen other than nasopharyngeal swab, presence of viral mutation(s) within the areas targeted by this assay, and inadequate number of viral copies(<138 copies/mL). A negative result must be combined with clinical observations, patient history, and epidemiological information. The expected result is Negative.  Fact Sheet for Patients:  BloggerCourse.com  Fact Sheet for Healthcare Providers:  SeriousBroker.it  This test is no t yet approved or cleared by the [place] FDA and  has been authorized for detection and/or diagnosis of SARS-CoV-2 by FDA under an Emergency Use Authorization (EUA). This EUA will remain  in effect (meaning this test can be used) for the duration of the COVID-19 declaration under Section 564(b)(1) of the Act, 21 U.S.C.section 360bbb-3(b)(1), unless  the authorization is terminated  or revoked sooner.       Influenza A by PCR NEGATIVE NEGATIVE Final   Influenza B by PCR NEGATIVE NEGATIVE Final    Comment: (NOTE) The Xpert Xpress SARS-CoV-2/FLU/RSV plus assay is intended as an aid in the diagnosis of influenza from Nasopharyngeal swab specimens and should not be used as a sole basis for treatment. Nasal washings and aspirates are unacceptable for Xpert Xpress SARS-CoV-2/FLU/RSV testing.  Fact Sheet for Patients: BloggerCourse.com  Fact Sheet for Healthcare Providers: SeriousBroker.it  This  test is not yet approved or cleared by the [place] FDA and has been authorized for detection and/or diagnosis of SARS-CoV-2 by FDA under an Emergency Use Authorization (EUA). This EUA will remain in effect (meaning this test can be used) for the duration of the COVID-19 declaration under Section 564(b)(1) of the Act, 21 U.S.C. section 360bbb-3(b)(1), unless the authorization is terminated or revoked.     Resp Syncytial Virus by PCR NEGATIVE NEGATIVE Final    Comment: (NOTE) Fact Sheet for Patients: BloggerCourse.com  Fact Sheet for Healthcare Providers: SeriousBroker.it  This test is not yet approved or cleared by the [place] FDA and has been authorized for detection and/or diagnosis of SARS-CoV-2 by FDA under an Emergency Use Authorization (EUA). This EUA will remain in effect (meaning this test can be used) for the duration of the COVID-19 declaration under Section 564(b)(1) of the Act, 21 U.S.C. section 360bbb-3(b)(1), unless the authorization is terminated or revoked.  Performed at [facility], [address] [place], [place] [address]   Surgical PCR screen     Status: None   Collection Time: 02/10/24 10:21 AM   Specimen: Nasal Mucosa; Nasal Swab  Result Value Ref Range Status   MRSA, PCR NEGATIVE NEGATIVE Final   Staphylococcus aureus NEGATIVE NEGATIVE Final    Comment: (NOTE) The Xpert SA Assay (FDA approved for NASAL specimens in patients 45 years of age and older), is one component of a comprehensive surveillance program. It is not intended to diagnose infection nor to guide or monitor treatment. Performed at [facility], [address], [place], [place] [address]          Radiology Studies: DG HIP UNILAT WITH PELVIS 2-3 VIEWS RIGHT Result Date: 02/10/2024 CLINICAL DATA:  Intraoperative fluoroscopy 4 dynamic hip screw, ORIF EXAM: Intraoperative fluoroscopy COMPARISON:  Preop  x-ray 02/09/2024. FINDINGS: Four fluoroscopic spot images submitted for review demonstrate placement dynamic hip screw transfixing the proximal intratrochanteric fracture. Long intramedullary rod. Imaging was obtained to aid in treatment. Expected alignment. Please correlate with real-time fluoroscopy of 1 minute and 13 seconds. Cumulative dose 10.09 mGy IMPRESSION: Intraoperative fluoroscopy Electronically Signed   By: Karen Kays M.D.   On: 02/10/2024 15:38   DG C-Arm 1-60 Min-No Report Result Date: 02/10/2024 Fluoroscopy was utilized by the requesting physician.  No radiographic interpretation.   DG C-Arm 1-60 Min-No Report Result Date: 02/10/2024 Fluoroscopy was utilized by the requesting physician.  No radiographic interpretation.   ECHOCARDIOGRAM COMPLETE Result Date: 02/10/2024    ECHOCARDIOGRAM REPORT   Patient Name:   Crystal Bass Date of Exam: 02/10/2024 Medical Rec #:  [id number]         Height:       62.0 in Accession #:    [id number]        Weight:       125.7 lb Date of Birth:  15-Dec-1940         BSA:  1.569 m Patient Age:    82 years          BP:           119/59 mmHg Patient Gender: F                 HR:           61 bpm. Exam Location:  [facility] Procedure: 2D Echo, Cardiac Doppler and Color Doppler (Both Spectral and Color            Flow Doppler were utilized during procedure). Indications:     Mitral valve disorder I05.9  History:         Patient has prior history of Echocardiogram examinations, most                  recent 09/05/2023. COPD; Risk Factors:Hypertension. History of                  mitral valve repair.                   Mitral Valve: Mitra-Clip valve is present in the mitral                  position.  Sonographer:     Cristela Blue Referring Phys:  [id number] CADENCE H FURTH Diagnosing Phys: Lorine Bears MD IMPRESSIONS  1. Left ventricular ejection fraction, by estimation, is 70 to 75%. The left ventricle has hyperdynamic function. The left ventricle has no regional  wall motion abnormalities. There is moderate asymmetric left ventricular hypertrophy of the septal segment. Left ventricular diastolic parameters are indeterminate.  2. Right ventricular systolic function is normal. The right ventricular size is normal. There is moderately elevated pulmonary artery systolic pressure.  3. Left atrial size was moderately dilated.  4. The mitral valve has been repaired/replaced. Moderate mitral valve regurgitation. Mild mitral stenosis. The mean mitral valve gradient is 6.0 mmHg. There is a Mitra-Clip present in the mitral position.  5. The aortic valve is normal in structure. Aortic valve regurgitation is not visualized. No aortic stenosis is present.  6. Moderately dilated pulmonary artery.  7. The inferior vena cava is normal in size with greater than 50% respiratory variability, suggesting right atrial pressure of 3 mmHg. FINDINGS  Left Ventricle: Left ventricular ejection fraction, by estimation, is 70 to 75%. The left ventricle has hyperdynamic function. The left ventricle has no regional wall motion abnormalities. The left ventricular internal cavity size was normal in size. There is moderate asymmetric left ventricular hypertrophy of the septal segment. Left ventricular diastolic parameters are indeterminate. Right Ventricle: The right ventricular size is normal. No increase in right ventricular wall thickness. Right ventricular systolic function is normal. There is moderately elevated pulmonary artery systolic pressure. The tricuspid regurgitant velocity is 3.34 m/s, and with an assumed right atrial pressure of 3 mmHg, the estimated right ventricular systolic pressure is 47.6 mmHg. Left Atrium: Left atrial size was moderately dilated. Right Atrium: Right atrial size was normal in size. Pericardium: There is no evidence of pericardial effusion. Mitral Valve: The mitral valve has been repaired/replaced. Moderate mitral valve regurgitation. There is a Mitra-Clip present in the  mitral position. Mild mitral valve stenosis. MV peak gradient, 13.1 mmHg. The mean mitral valve gradient is 6.0 mmHg. Tricuspid Valve: The tricuspid valve is normal in structure. Tricuspid valve regurgitation is mild . No evidence of tricuspid stenosis. Aortic Valve: The aortic valve is normal in structure. Aortic valve regurgitation is not visualized. No  aortic stenosis is present. Aortic valve mean gradient measures 3.0 mmHg. Aortic valve peak gradient measures 5.2 mmHg. Aortic valve area, by VTI measures 4.39 cm. Pulmonic Valve: The pulmonic valve was normal in structure. Pulmonic valve regurgitation is trivial. No evidence of pulmonic stenosis. Aorta: The aortic root is normal in size and structure. Pulmonary Artery: The pulmonary artery is moderately dilated. Venous: The inferior vena cava is normal in size with greater than 50% respiratory variability, suggesting right atrial pressure of 3 mmHg. IAS/Shunts: No atrial level shunt detected by color flow Doppler.  LEFT VENTRICLE PLAX 2D LVIDd:         5.20 cm   Diastology LVIDs:         2.40 cm   LV e' medial:    4.79 cm/s LV PW:         1.10 cm   LV E/e' medial:  34.0 LV IVS:        1.70 cm   LV e' lateral:   6.64 cm/s LVOT diam:     2.55 cm   LV E/e' lateral: 24.5 LV SV:         76 LV SV Index:   48 LVOT Area:     5.11 cm  RIGHT VENTRICLE RV Basal diam:  2.50 cm RV Mid diam:    3.20 cm RV S prime:     11.30 cm/s TAPSE (M-mode): 1.6 cm LEFT ATRIUM             Index        RIGHT ATRIUM           Index LA diam:        4.40 cm 2.80 cm/m   RA Area:     13.90 cm LA Vol (A2C):   61.1 ml 38.94 ml/m  RA Volume:   36.10 ml  23.01 ml/m LA Vol (A4C):   64.8 ml 41.30 ml/m LA Biplane Vol: 64.3 ml 40.98 ml/m  AORTIC VALVE AV Area (Vmax):    3.36 cm AV Area (Vmean):   3.85 cm AV Area (VTI):     4.39 cm AV Vmax:           114.00 cm/s AV Vmean:          72.400 cm/s AV VTI:            0.172 m AV Peak Grad:      5.2 mmHg AV Mean Grad:      3.0 mmHg LVOT Vmax:          75.00 cm/s LVOT Vmean:        54.600 cm/s LVOT VTI:          0.148 m LVOT/AV VTI ratio: 0.86  AORTA Ao Root diam: 3.30 cm MITRAL VALVE                TRICUSPID VALVE MV Area (PHT): 1.65 cm     TR Peak grad:   44.6 mmHg MV Area VTI:   1.24 cm     TR Vmax:        334.00 cm/s MV Peak grad:  13.1 mmHg MV Mean grad:  6.0 mmHg     SHUNTS MV Vmax:       1.81 m/s     Systemic VTI:  0.15 m MV Vmean:      111.0 cm/s   Systemic Diam: 2.55 cm MV Decel Time: 460 msec MV E velocity: 163.00 cm/s MV A velocity: 88.60 cm/s MV E/A ratio:  1.84 Lorine Bears MD Electronically signed by Lorine Bears MD Signature Date/Time: 02/10/2024/9:07:50 AM    Final    DG Chest 1 View Result Date: 02/09/2024 CLINICAL DATA:  Larey Seat, pain EXAM: CHEST  1 VIEW COMPARISON:  09/04/2023 FINDINGS: Single frontal view of the chest demonstrates stable enlargement of the cardiac silhouette. Postsurgical changes are seen from mitral valve repair. There is increased consolidation within the medial right upper lung zone since prior study, consistent with airspace disease superimposed upon chronic atelectasis and bronchiectasis seen on prior CTs. Increased pulmonary vascular congestion. No effusion or pneumothorax. No acute bony abnormalities. IMPRESSION: 1. Enlarged cardiac silhouette. 2. Pulmonary vascular congestion. Increased consolidation within the medial right upper lung zone may reflect airspace disease superimposed upon known chronic scarring and bronchiectasis. Electronically Signed   By: Sharlet Salina M.D.   On: 02/09/2024 17:44   DG Hip Unilat W or Wo Pelvis 2-3 Views Right Result Date: 02/09/2024 CLINICAL DATA:  Right hip pain EXAM: DG HIP (WITH OR WITHOUT PELVIS) 2-3V RIGHT COMPARISON:  09/22/2022 FINDINGS: Acute, mildly comminuted intertrochanteric fracture of the proximal right femur with mild displacement and varus angulation. Hip joint alignment is maintained without dislocation. Prior left hip arthroplasty. Bony pelvis intact without  evidence of fracture or diastasis. Soft tissue swelling overlies the right hip laterally. IMPRESSION: Acute, mildly comminuted intertrochanteric fracture of the proximal right femur. Electronically Signed   By: Duanne Guess D.O.   On: 02/09/2024 17:43   DG Wrist Complete Right Result Date: 02/09/2024 CLINICAL DATA:  Larey Seat, right wrist pain EXAM: RIGHT WRIST - COMPLETE 3+ VIEW COMPARISON:  None Available. FINDINGS: Frontal, oblique, lateral, and ulnar deviated views of the right wrist are obtained. There is an impacted extra-articular fracture through the distal right radial metaphysis, with mild impaction at the fracture site. Anatomic alignment of the radiocarpal joint. Prominent osteoarthritis within the radial aspect of the carpus and at the first carpometacarpal joint. Mild soft tissue swelling. IMPRESSION: 1. Impacted extra-articular fracture of the distal radial metaphysis, with near anatomic alignment. 2. 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 3. Soft tissue swelling. Electronically Signed   By: Sharlet Salina M.D.   On: 02/09/2024 17:41        Scheduled Meds:  amiodarone  100 mg Oral Daily   calcium carbonate  500 mg of elemental calcium Oral Q breakfast   Carbidopa-Levodopa ER  1 tablet Oral QAC supper   Carbidopa-Levodopa ER  2 tablet Oral BID   docusate sodium  100 mg Oral BID   enoxaparin (LOVENOX) injection  40 mg Subcutaneous Q24H   fluticasone furoate-vilanterol  1 puff Inhalation Daily   And   umeclidinium bromide  1 puff Inhalation Daily   furosemide  40 mg Oral Daily   multivitamin with minerals  1 tablet Oral Daily   mupirocin ointment  1 Application Nasal BID   Continuous Infusions:     LOS: 2 days    Time spent: 35 minutes    Delfino Lovett, MD [facility] Pager [phone]  If 7PM-7AM, please contact night-coverage [email or web address]  02/11/2024, 4:43 PM

## 2024-02-12 DIAGNOSIS — S72001A Fracture of unspecified part of neck of right femur, initial encounter for closed fracture: Secondary | ICD-10-CM | POA: Diagnosis not present

## 2024-02-12 DIAGNOSIS — S52531A Colles' fracture of right radius, initial encounter for closed fracture: Secondary | ICD-10-CM | POA: Insufficient documentation

## 2024-02-12 DIAGNOSIS — Z7189 Other specified counseling: Secondary | ICD-10-CM | POA: Diagnosis not present

## 2024-02-12 LAB — CBC
HCT: 25.1 % — ABNORMAL LOW (ref 36.0–46.0)
Hemoglobin: 8.1 g/dL — ABNORMAL LOW (ref 12.0–15.0)
MCH: 32.5 pg (ref 26.0–34.0)
MCHC: 32.3 g/dL (ref 30.0–36.0)
MCV: 100.8 fL — ABNORMAL HIGH (ref 80.0–100.0)
Platelets: 98 10*3/uL — ABNORMAL LOW (ref 150–400)
RBC: 2.49 MIL/uL — ABNORMAL LOW (ref 3.87–5.11)
RDW: 13.8 % (ref 11.5–15.5)
WBC: 10.2 10*3/uL (ref 4.0–10.5)
nRBC: 0 % (ref 0.0–0.2)

## 2024-02-12 LAB — BASIC METABOLIC PANEL
Anion gap: 4 — ABNORMAL LOW (ref 5–15)
BUN: 40 mg/dL — ABNORMAL HIGH (ref 8–23)
CO2: 31 mmol/L (ref 22–32)
Calcium: 8.7 mg/dL — ABNORMAL LOW (ref 8.9–10.3)
Chloride: 100 mmol/L (ref 98–111)
Creatinine, Ser: 1 mg/dL (ref 0.44–1.00)
GFR, Estimated: 57 mL/min — ABNORMAL LOW (ref >60)
Glucose, Bld: 105 mg/dL — ABNORMAL HIGH (ref 70–99)
Potassium: 4.2 mmol/L (ref 3.5–5.1)
Sodium: 135 mmol/L (ref 135–145)

## 2024-02-12 LAB — GLUCOSE, CAPILLARY: Glucose-Capillary: 102 mg/dL — ABNORMAL HIGH (ref 70–99)

## 2024-02-12 MED ORDER — PANTOPRAZOLE SODIUM 20 MG PO TBEC
20.0000 mg | DELAYED_RELEASE_TABLET | Freq: Every day | ORAL | Status: DC
  Administered 2024-02-13: 20 mg via ORAL
  Filled 2024-02-12 (×2): qty 1

## 2024-02-12 MED ORDER — APIXABAN 2.5 MG PO TABS: 2.5000 mg | ORAL_TABLET | Freq: Two times a day (BID) | ORAL | Status: DC

## 2024-02-12 MED ORDER — CALCIUM CARBONATE ANTACID 500 MG PO CHEW: 2.0000 | CHEWABLE_TABLET | Freq: Every day | ORAL | Status: DC | PRN

## 2024-02-12 NOTE — Progress Notes (Addendum)
 Daily Progress Note   Patient Name: Crystal Bass       Date: 02/12/2024 DOB: [birth year]  Age: 83 y.o. MRN#: [id number] Attending Physician: Sunnie Nielsen, DO Primary Care Physician: Earnestine Mealing, MD Admit Date: 02/09/2024  Reason for Consultation/Follow-up: Establishing goals of care  Subjective: Notes and labs reviewed. Epic chat with attending team regarding case. In to see patient. Her brother Crystal Bass is at bedside. Patient attempts to answer some questions today, but is very delayed, and is confused.  Discussed that she is at the hospital, why she is here, and her care up to this point. She denies pain or other complaint. Brother received a phone call he needed to take.     Length of Stay: 3  Current Medications: Scheduled Meds:  . amiodarone  100 mg Oral Daily  . apixaban  2.5 mg Oral BID  . calcium carbonate  500 mg of elemental calcium Oral Q breakfast  . Carbidopa-Levodopa ER  1 tablet Oral QAC supper  . Carbidopa-Levodopa ER  2 tablet Oral BID  . docusate sodium  100 mg Oral BID  . fluticasone furoate-vilanterol  1 puff Inhalation Daily   And  . umeclidinium bromide  1 puff Inhalation Daily  . furosemide  40 mg Oral Daily  . multivitamin with minerals  1 tablet Oral Daily  . mupirocin ointment  1 Application Nasal BID    Continuous Infusions:   PRN Meds: acetaminophen, albuterol, bisacodyl, diphenhydrAMINE, HYDROcodone-acetaminophen, magnesium hydroxide, methocarbamol **OR** methocarbamol (ROBAXIN) injection, metoCLOPramide **OR** metoCLOPramide (REGLAN) injection, ondansetron **OR** ondansetron (ZOFRAN) IV, sodium phosphate  Physical Exam Pulmonary:     Effort: Pulmonary effort is normal.  Neurological:     Mental Status: She is alert.             Vital  Signs: BP 122/69 (BP Location: Right Arm)   Pulse 62   Temp 98.1 F (36.7 C)   Resp 15   Ht 5\' 2"  (1.575 m)   Wt 57 kg   SpO2 100%   BMI 22.98 kg/m  SpO2: SpO2: 100 % O2 Device: O2 Device: Nasal Cannula O2 Flow Rate: O2 Flow Rate (L/min): 2 L/min  Intake/output summary:  Intake/Output Summary (Last 24 hours) at 02/12/2024 1335 Last data filed at 02/12/2024 0412 Gross per 24 hour  Intake --  Output 200  ml  Net -200 ml   LBM: Last BM Date :  (uta) Baseline Weight: Weight: 57 kg Most recent weight: Weight: 57 kg   Patient Active Problem List   Diagnosis Date Noted  . Protein-calorie malnutrition, severe 02/11/2024  . Elevated troponin 02/10/2024  . Closed right radial fracture 02/09/2024  . Pulmonary embolism and infarction (HCC) 11/13/2023  . Atrial fibrillation with rapid ventricular response (HCC) 09/05/2023  . Atrial fibrillation with RVR (HCC) 09/04/2023  . Sepsis (HCC) 09/04/2023  . Acute cystitis 09/02/2023  . Atrial fibrillation s/p cardioversion 08/2023 (HCC) 09/02/2023  . Daytime somnolence 08/19/2023  . Pulmonary nodules 08/09/2023  . Frailty 08/09/2023  . COVID-19 07/08/2023  . Chronic heart failure with preserved ejection fraction (HFpEF) (HCC) 04/04/2023  . Hypervolemia 02/08/2023  . Acute on chronic heart failure with preserved ejection fraction (HFpEF) (HCC) 08/20/2022  . Closed right hip fracture, initial encounter (HCC) 08/19/2022  . Severe mitral regurgitation 02/15/2022  . S/P mitral valve clip implantation 02/15/2022  . Ovarian tumor of borderline malignancy, right 10/28/2020  . Angioedema 05/04/2020  . Chronic neuropathic pain 01/27/2020  . Acute midline low back pain with right-sided sciatica 10/26/2019  . [name] (mycobacterium avium-intracellulare) infection (HCC) 11/01/2018  . History of pulmonary embolism 2019 10/31/2018  . Orthostatic hypotension 08/25/2018  . Chronic fatigue 10/23/2017  . Parkinson disease (HCC) 01/10/2016  . Hypercalcemia  09/14/2013  . Osteoporosis, post-menopausal 03/14/2012  . Dyspnea on exertion, acute on chronic 03/28/2011  . CAP (community acquired pneumonia) 09/27/2009  . COPD (chronic obstructive pulmonary disease) (HCC) 03/25/2008  . History of left breast cancer 03/10/2008  . Essential hypertension 02/10/2007    Palliative Care Assessment & Plan    Recommendations/Plan: PMT following. Time for outcomes.   Code Status:    Code Status Orders  (From admission, onward)           Start     Ordered   02/09/24 2135  Do not attempt resuscitation (DNR)- Limited -Do Not Intubate (DNI)  Continuous       Question Answer Comment  If pulseless and not breathing No CPR or chest compressions.   In Pre-Arrest Conditions (Patient Is Breathing and Has A Pulse) Do not intubate. Provide all appropriate non-invasive medical interventions. Avoid ICU transfer unless indicated or required.   Consent: Discussion documented in EHR or advanced directives reviewed      02/09/24 2134           Code Status History     Date Active Date Inactive Code Status Order ID Comments User Context   09/04/2023 1345 09/09/2023 2031 Limited: Do not attempt resuscitation (DNR) -DNR-LIMITED -Do Not Intubate/DNI  [id number]  Floydene Flock, MD ED   08/09/2023 0418 08/10/2023 1920 Limited: Do not attempt resuscitation (DNR) -DNR-LIMITED -Do Not Intubate/DNI  [id number]  Andris Baumann, MD ED   08/09/2023 [phone] 08/09/2023 0418 Limited: Do not attempt resuscitation (DNR) -DNR-LIMITED -Do Not Intubate/DNI  [id number]  Delton Prairie, MD ED   04/03/2023 1449 04/04/2023 2003 Full Code [id number]  Janetta Hora, PA-C Inpatient   11/21/2022 1455 11/26/2022 2118 DNR [id number]  Cox, Amy N, DO ED   11/21/2022 1332 11/21/2022 1455 Full Code [id number]  Cox, Amy Dorris Carnes, DO ED   08/27/2022 1915 09/22/2022 1021 DNR [id number]  Earnestine Mealing, MD Outpatient   08/20/2022 0208 08/24/2022 1704 DNR [id number]  Hannah Beat, MD Inpatient   08/19/2022 2322  08/20/2022 0207 Full Code [id number]  Mansy, Vernetta Honey, MD ED   02/15/2022  1223 02/16/2022 1931 Full Code [id number]  Filbert Schilder, NP Inpatient   11/14/2021 1119 11/14/2021 2049 Full Code [id number]  Iran Ouch, MD Inpatient   10/26/2018 0436 10/27/2018 1931 DNR [id number]  Arnaldo Natal ED   05/20/2015 1542 06/18/2016 1620 DNR [id number]  Jerene Bears, MD Outpatient      Advance Directive Documentation    Flowsheet Row Most Recent Value  Type of Advance Directive Out of facility DNR (pink MOST or yellow form)  Pre-existing out of facility DNR order (yellow form or pink MOST form) Pink Most/Yellow Form available - Physician notified to receive inpatient order, Pink MOST/Yellow Form most recent copy in chart - Physician notified to receive inpatient order  "MOST" Form in Place? --      Care plan was discussed with attending  Thank you for allowing the Palliative Medicine Team to assist in the care of this patient.  Morton Stall, NP  Please contact Palliative Medicine Team phone at [phone] for questions and concerns.

## 2024-02-12 NOTE — TOC Progression Note (Addendum)
 Transition of Care [facility]) - Progression Note    Patient Details  Name: Crystal Bass MRN: [id number] Date of Birth: Oct 22, 1941  Transition of Care [facility]) CM/SW Contact  Marlowe Sax, RN Phone Number: 02/12/2024, 2:51 PM  Clinical Narrative:     Called brother Loraine Leriche and Sister Mitzi Davenport was not able to reach either, left a VM for a call back Spoke with Loraine Leriche, he is agreeable for her to go to [facility] for [employer], Ins pending, [facility] did make a bed offer Expected Discharge Plan: Skilled Nursing Facility Barriers to Discharge: SNF Pending bed offer, Insurance Authorization  Expected Discharge Plan and Services   Discharge Planning Services: CM Consult   Living arrangements for the past 2 months: Assisted Living Facility                                       Social Determinants of Health (SDOH) Interventions SDOH Screenings   Food Insecurity: No Food Insecurity (02/10/2024)  Housing: Low Risk  (02/10/2024)  Transportation Needs: No Transportation Needs (02/10/2024)  Utilities: Not At Risk (02/10/2024)  Alcohol Screen: Low Risk  (08/13/2022)  Depression (PHQ2-9): Low Risk  (12/07/2022)  Financial Resource Strain: Low Risk  (08/13/2022)  Physical Activity: Insufficiently Active (08/13/2022)  Social Connections: Moderately Isolated (02/10/2024)  Stress: No Stress Concern Present (08/13/2022)  Tobacco Use: Low Risk  (02/10/2024)    Readmission Risk Interventions     No data to display

## 2024-02-12 NOTE — Plan of Care (Signed)

## 2024-02-12 NOTE — Progress Notes (Signed)
 Subjective: 2 Days Post-Op Procedure(s) (LRB): FIXATION, FRACTURE, INTERTROCHANTERIC, WITH INTRAMEDULLARY ROD (Right) Right distal radius fracture, being treated non-op Patient reports pain as mild.  Sleeping well when I enter the room this AM. Patient is well, and has had no acute complaints or problems Plan is for SNF following discharge. Negative for chest pain and shortness of breath Fever: no Gastrointestinal:Negative for nausea and vomiting this AM.  Objective: Vital signs in last 24 hours: Temp:  [97.4 F (36.3 C)-98 F (36.7 C)] 98 F (36.7 C) (03/19 0411) Pulse Rate:  [63-74] 63 (03/19 0411) Resp:  [15-18] 18 (03/19 0411) BP: (110-141)/(62-79) 123/68 (03/19 0411) SpO2:  [96 %-100 %] 100 % (03/19 0411)  Intake/Output from previous day:  Intake/Output Summary (Last 24 hours) at 02/12/2024 0657 Last data filed at 02/12/2024 0412 Gross per 24 hour  Intake --  Output 200 ml  Net -200 ml    Intake/Output this shift: Total I/O In: -  Out: 200 [Urine:200]  Labs: Recent Labs    02/09/24 1654 02/10/24 0855 02/11/24 0354 02/11/24 1335 02/12/24 0150  HGB 12.9 12.6 9.3* 9.3* 8.1*   Recent Labs    02/11/24 0354 02/12/24 0150  WBC 9.4 10.2  RBC 2.89* 2.49*  HCT 28.7* 25.1*  PLT 109* 98*   Recent Labs    02/11/24 0354 02/12/24 0150  NA 136 135  K 4.2 4.2  CL 98 100  CO2 29 31  BUN 31* 40*  CREATININE 1.14* 1.00  GLUCOSE 143* 105*  CALCIUM 9.0 8.7*   Recent Labs    02/09/24 2100  INR 1.2     EXAM General - Patient is Alert and Appropriate.  Able to answer my questions but does take awhile to respond. Extremity - Right wrist splint intact.  Able to flex and extend fingers.  Towel applied to elevate the right wrist. Honeycomb dressings intact without drainage.  No signs of active bleeding. Right thigh is soft.  Swelling improving. Able to dorsiflex and plantarflex the right foot. Dressing/Incision - clean, dry, no drainage Motor Function -  intact, moving foot and toes well on exam.   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 10/2021 Cath: Nl cors.   Arthritis    Asthma    Breast cancer (HCC) 2002   infiltrative ductal carcinoma    Cataract 2019   Chronic heart failure with preserved ejection fraction (HFpEF) (HCC)    COPD (chronic obstructive pulmonary disease) (HCC) 07/2007   Coronary artery disease    Ductal carcinoma of breast, estrogen receptor positive, stage 1 (HCC) 09/16/2012   Endometriosis    GERD (gastroesophageal reflux disease)    Hx of mitral valve repair    Hypercalcemia 09/14/2013   Hypertension    Lesion of breast 1992   right, benign   Lung disease    secondary to [name] infection   Mastalgia 05/1994   Mitral regurgitation    a. 10/2021 RHC: PCWP V wave of 34; b. 01/2022 TEE: Severe flail/prolapse of large area of A2 scallop with severe posteriorly directed MR; c. 01/2022 TEER w /Mitraclip XTW x 2; d. 07/2022 Echo: EF 60-65%. No rwma, mild LVH, GrIII DD. Sev dil LA. Mod elev PASP. Mod-Sev MR. Mod MS.   Moderate mitral regurgitation by prior echocardiogram    Osteoporosis, post-menopausal 03/14/2012   Ovarian tumor of borderline malignancy, right 2004  Palpitations    a. 02/2022 Zio: Predominantly sinus rhythm at 67 (52-171).  2 runs of NSVT -fastest 9 beats at 167.  35 SVT runs-longest 19 beats, fastest 171x6 beats.   Pancreatitis    secondary to Cholelithiasis   Parkinson disease (HCC) 02/10/2015   Post-thoracotomy pain syndrome    Pseudomonas pneumonia (HCC) 03/2008   Status post implantation of mitral valve leaflet clip 02/15/2022   s/p XTW MitraClip x2 by Dr. Excell Seltzer in the A2/P2: b. 04/03/23 s/p third mitraclip implantation.   Traumatic closed fracture of distal clavicle with minimal displacement, left, initial encounter 06/2021   [facility]   Uterine fibroid    Vitamin D deficiency      Assessment/Plan: 2 Days Post-Op Procedure(s) (LRB): FIXATION, FRACTURE, INTERTROCHANTERIC, WITH INTRAMEDULLARY ROD (Right) Principal Problem:   Closed right hip fracture, initial encounter [facility]) Active Problems:   History of pulmonary embolism 2019   Chronic heart failure with preserved ejection fraction (HFpEF) (HCC)   Atrial fibrillation s/p cardioversion 08/2023 [facility])   Closed right radial fracture   Elevated troponin   Protein-calorie malnutrition, severe  Estimated body mass index is 22.98 kg/m as calculated from the following:   Height as of this encounter: 5\' 2"  (1.575 m).   Weight as of this encounter: 57 kg. Advance diet Up with therapy D/C IV fluids when tolerating po intake.  Labs and vitals reviewed. Hg down to 8.1, consider transfusion if continuing to trend down to 7. Up with therapy today.  Plan for SNF following discharge. Continue to work on a BM.  DVT Prophylaxis - Lovenox and TED hose Weight-Bearing as tolerated to right leg NWB to the right arm.  Valeria Batman, PA-C [facility] 02/12/2024, 6:57 AM

## 2024-02-12 NOTE — Hospital Course (Addendum)
 Hospital course / significant events:   HPI: 83 y.o. female with medical history significant for Parkinson's disease, COPD/bronchiectasis, mitral regurgitation s/p MitraClip in 2023 with redo 03/2023, diastolic CHF history of PE in 6578, A-fib in 08/2023 s/p cardioversion, on Eliquis and amiodarone    03/16: admitted with an accidental fall fall in which she sustained a right intertrochanteric fracture and a buckle fracture of the right distal radius.  Dr. Joice Lofts is aware and will take patient to the OR on 3/17  3/17: Cardiology for preop clearance. Echo done.S/p hip surgery 3/18: palliative care consult for goals of care 3/19: awaiting placement    Consultants:  Orthopedics Cardiology  Palliative Care  Procedures/Surgeries: 03/17: FIXATION, FRACTURE, INTERTROCHANTERIC, WITH INTRAMEDULLARY ROD (Right)       ASSESSMENT & PLAN:   Closed right hip fracture, initial encounter Secondary to accidental mechanical fall S/p FIXATION, FRACTURE, INTERTROCHANTERIC, WITH INTRAMEDULLARY ROD (Right) on 3/17 DVT prophylaxis and pain management per orthopedics WBAT to R leg   Atrial fibrillation s/p cardioversion 08/2023  Eliquis amiodarone   Chronic heart failure with preserved ejection fraction (HFpEF)  Mild fluid overload on admission 2L O2 requirement, have reasonably excluded PNA Diuresis w/ home lasix resumed   Elevated troponin Likely demand ischemia Have reasonably excluded ACS Recheck as needed    Closed right radial fracture Buckle fracture.   Manage per orthopedics NWB R arm   COPD (chronic obstructive pulmonary disease) without exacerbation  Continue home inhalers DuoNebs if needed   History of pulmonary embolism Eliquis    S/P mitral valve clip implantation S/p MitraClip's x 2 due to inadequate symptomatic improvement after first MitraClip in 2023 No acute issues suspected   Parkinson disease Continue Sinemet.   Frailty Had prior left hip fracture PT  recommends SNF Ambulates with rollator/walker at baseline .     [facility] Palliative care and hospitalist team have had several discussions with son and niece - they're not interested in Hospice and feels patient's problem is transient.    No concerns based on BMI: Body mass index is 22.98 kg/m.  Underweight - under 18  overweight - 25 to 29 obese - 30 or more Class 1 obesity: BMI of 30.0 to 34 Class 2 obesity: BMI of 35.0 to 39 Class 3 obesity: BMI of 40.0 to 49 Super Morbid Obesity: BMI 50-59 Super-super Morbid Obesity: BMI 60+ Significantly low or high BMI is associated with higher medical risk.  Weight management advised as adjunct to other disease management and risk reduction treatments    DVT prophylaxis: Eliquis IV fluids: no continuous IV fluids  Nutrition: regular diet  Central lines / invasive devices: none  Code Status: DNR ACP documentation reviewed:  Advanced Directive, DNR and MOST orders are on file in [facility]  [facility] needs: SNF placement - plan for [facility] pending bed offer and insurance auth. Barriers to dispo / significant pending items: expect medically ready for discharge today 02/12/24

## 2024-02-12 NOTE — Progress Notes (Signed)
 PROGRESS NOTE    Crystal Bass   GNF:[id number] DOB: 1941/01/17  DOA: 02/09/2024 Date of Service: 02/12/24 which is hospital day 3  PCP: Earnestine Mealing, MD    Hospital course / significant events:   HPI: 83 y.o. female with medical history significant for Parkinson's disease, COPD/bronchiectasis, mitral regurgitation s/p MitraClip in 2023 with redo 03/2023, diastolic CHF history of PE in 8469, A-fib in 08/2023 s/p cardioversion, on Eliquis and amiodarone    03/16: admitted with an accidental fall fall in which she sustained a right intertrochanteric fracture and a buckle fracture of the right distal radius.  Dr. Joice Lofts is aware and will take patient to the OR on 3/17  3/17: Cardiology for preop clearance. Echo done.S/p hip surgery 3/18: palliative care consult for goals of care 3/19: awaiting placement    Consultants:  Orthopedics Cardiology  Palliative Care  Procedures/Surgeries: 03/17: FIXATION, FRACTURE, INTERTROCHANTERIC, WITH INTRAMEDULLARY ROD (Right)       ASSESSMENT & PLAN:   Closed right hip fracture, initial encounter Secondary to accidental mechanical fall S/p FIXATION, FRACTURE, INTERTROCHANTERIC, WITH INTRAMEDULLARY ROD (Right) on 3/17 DVT prophylaxis and pain management per orthopedics WBAT to R leg   Atrial fibrillation s/p cardioversion 08/2023  Eliquis amiodarone   Chronic heart failure with preserved ejection fraction (HFpEF)  Mild fluid overload on admission 2L O2 requirement, have reasonably excluded PNA Diuresis w/ home lasix resumed   Elevated troponin Likely demand ischemia Have reasonably excluded ACS Recheck as needed    Closed right radial fracture Buckle fracture.   Manage per orthopedics NWB R arm   COPD (chronic obstructive pulmonary disease) without exacerbation  Continue home inhalers DuoNebs if needed   History of pulmonary embolism Eliquis    S/P mitral valve clip implantation S/p MitraClip's x 2 due to  inadequate symptomatic improvement after first MitraClip in 2023 No acute issues suspected   Parkinson disease Continue Sinemet.   Frailty Had prior left hip fracture PT recommends SNF Ambulates with rollator/walker at baseline .     [facility] Palliative care and hospitalist team have had several discussions with son and niece - they're not interested in Hospice and feels patient's problem is transient.    No concerns based on BMI: Body mass index is 22.98 kg/m.  Underweight - under 18  overweight - 25 to 29 obese - 30 or more Class 1 obesity: BMI of 30.0 to 34 Class 2 obesity: BMI of 35.0 to 39 Class 3 obesity: BMI of 40.0 to 49 Super Morbid Obesity: BMI 50-59 Super-super Morbid Obesity: BMI 60+ Significantly low or high BMI is associated with higher medical risk.  Weight management advised as adjunct to other disease management and risk reduction treatments    DVT prophylaxis: Eliquis IV fluids: no continuous IV fluids  Nutrition: regular diet  Central lines / invasive devices: none  Code Status: DNR ACP documentation reviewed:  Advanced Directive, DNR and MOST orders are on file in [facility]  [facility] needs: SNF placement - plan for [facility] pending bed offer and insurance auth. Barriers to dispo / significant pending items: expect medically ready for discharge today 02/12/24               Subjective / Brief ROS:  Patient reports she doesn't think she is going ot get better She denies pain now.  Denies CP/SOB.  Pain controlled.  Denies new weakness.  Tolerating diet but no ral appetite Reports no concerns w/ urination/defecation.   Family Communication: family is at bedside on  rounds     Objective Findings:  Vitals:   02/11/24 1709 02/11/24 2142 02/12/24 0411 02/12/24 0801  BP: (!) 141/79 110/64 123/68 122/69  Pulse: 74 69 63 62  Resp: 15 16 18 15   Temp: (!) 97.4 F (36.3 C) 97.6 F (36.4 C) 98 F (36.7 C) 98.1 F (36.7 C)  TempSrc:   Oral   SpO2:  97% 96% 100% 100%  Weight:      Height:        Intake/Output Summary (Last 24 hours) at 02/12/2024 1546 Last data filed at 02/12/2024 4540 Gross per 24 hour  Intake --  Output 200 ml  Net -200 ml   Filed Weights   02/09/24 1649 02/10/24 1155  Weight: 57 kg 57 kg    Examination:  Physical Exam Constitutional:      General: She is not in acute distress. Cardiovascular:     Rate and Rhythm: Normal rate and regular rhythm.  Pulmonary:     Effort: Pulmonary effort is normal.     Breath sounds: Normal breath sounds.  Abdominal:     Palpations: Abdomen is soft.  Musculoskeletal:     Right lower leg: No edema.     Left lower leg: No edema.  Skin:    General: Skin is warm and dry.  Neurological:     Mental Status: She is alert. She is disoriented.  Psychiatric:        Behavior: Behavior normal.          Scheduled Medications:   amiodarone  100 mg Oral Daily   apixaban  2.5 mg Oral BID   calcium carbonate  500 mg of elemental calcium Oral Q breakfast   Carbidopa-Levodopa ER  1 tablet Oral QAC supper   Carbidopa-Levodopa ER  2 tablet Oral BID   docusate sodium  100 mg Oral BID   fluticasone furoate-vilanterol  1 puff Inhalation Daily   And   umeclidinium bromide  1 puff Inhalation Daily   furosemide  40 mg Oral Daily   multivitamin with minerals  1 tablet Oral Daily   mupirocin ointment  1 Application Nasal BID   pantoprazole  20 mg Oral Daily    Continuous Infusions:   PRN Medications:  acetaminophen, albuterol, bisacodyl, calcium carbonate, diphenhydrAMINE, HYDROcodone-acetaminophen, magnesium hydroxide, methocarbamol **OR** methocarbamol (ROBAXIN) injection, metoCLOPramide **OR** metoCLOPramide (REGLAN) injection, ondansetron **OR** ondansetron (ZOFRAN) IV, sodium phosphate  Antimicrobials from admission:  Anti-infectives (From admission, onward)    Start     Dose/Rate Route Frequency Ordered Stop   02/10/24 1900  ceFAZolin (ANCEF) IVPB 2g/100 mL premix         2 g 200 mL/hr over 30 Minutes Intravenous Every 6 hours 02/10/24 1633 02/11/24 0140   02/10/24 0000  ceFAZolin (ANCEF) IVPB 2g/100 mL premix        2 g 200 mL/hr over 30 Minutes Intravenous 30 min pre-op 02/09/24 1800 02/10/24 1252           Data Reviewed:  I have personally reviewed the following...  CBC: Recent Labs  Lab 02/09/24 1654 02/10/24 0855 02/11/24 0354 02/11/24 1335 02/12/24 0150  WBC 6.5 11.1* 9.4  --  10.2  NEUTROABS 4.3  --   --   --   --   HGB 12.9 12.6 9.3* 9.3* 8.1*  HCT 39.8 39.2 28.7*  --  25.1*  MCV 99.5 99.2 99.3  --  100.8*  PLT 158 138* 109*  --  98*   Basic Metabolic Panel: Recent Labs  Lab 02/09/24 1654 02/10/24 0855 02/11/24 0354 02/12/24 0150  NA 137 141 136 135  K 3.9 4.3 4.2 4.2  CL 98 102 98 100  CO2 28 27 29 31   GLUCOSE 107* 119* 143* 105*  BUN 19 22 31* 40*  CREATININE 1.06* 0.81 1.14* 1.00  CALCIUM 9.7 9.1 9.0 8.7*   GFR: Estimated Creatinine Clearance: 35.5 mL/min (by C-G formula based on SCr of 1 mg/dL). Liver Function Tests: No results for input(s): "AST", "ALT", "ALKPHOS", "BILITOT", "PROT", "ALBUMIN" in the last 168 hours. No results for input(s): "LIPASE", "AMYLASE" in the last 168 hours. No results for input(s): "AMMONIA" in the last 168 hours. Coagulation Profile: Recent Labs  Lab 02/09/24 2100  INR 1.2   Cardiac Enzymes: No results for input(s): "CKTOTAL", "CKMB", "CKMBINDEX", "TROPONINI" in the last 168 hours. BNP (last 3 results) Recent Labs    08/05/23 1114  PROBNP 484.0*   HbA1C: No results for input(s): "HGBA1C" in the last 72 hours. CBG: Recent Labs  Lab 02/12/24 1119  GLUCAP 102*   Lipid Profile: No results for input(s): "CHOL", "HDL", "LDLCALC", "TRIG", "CHOLHDL", "LDLDIRECT" in the last 72 hours. Thyroid Function Tests: No results for input(s): "TSH", "T4TOTAL", "FREET4", "T3FREE", "THYROIDAB" in the last 72 hours. Anemia Panel: No results for input(s): "VITAMINB12", "FOLATE",  "FERRITIN", "TIBC", "IRON", "RETICCTPCT" in the last 72 hours. Most Recent Urinalysis On File:     Component Value Date/Time   COLORURINE STRAW (A) 09/04/2023 1249   APPEARANCEUR CLEAR (A) 09/04/2023 1249   LABSPEC 1.008 09/04/2023 1249   PHURINE 5.0 09/04/2023 1249   GLUCOSEU NEGATIVE 09/04/2023 1249   HGBUR NEGATIVE 09/04/2023 1249   BILIRUBINUR NEGATIVE 09/04/2023 1249   BILIRUBINUR Negative 09/02/2023 1244   KETONESUR NEGATIVE 09/04/2023 1249   PROTEINUR NEGATIVE 09/04/2023 1249   UROBILINOGEN 0.2 09/02/2023 1244   NITRITE NEGATIVE 09/04/2023 1249   LEUKOCYTESUR NEGATIVE 09/04/2023 1249   Sepsis Labs: @LABRCNTIP (procalcitonin:4,lacticidven:4) Microbiology: Recent Results (from the past 240 hours)  Resp panel by RT-PCR (RSV, Flu A&B, Covid) Anterior Nasal Swab     Status: None   Collection Time: 02/09/24  9:00 PM   Specimen: Anterior Nasal Swab  Result Value Ref Range Status   SARS Coronavirus 2 by RT PCR NEGATIVE NEGATIVE Final    Comment: (NOTE) SARS-CoV-2 target nucleic acids are NOT DETECTED.  The SARS-CoV-2 RNA is generally detectable in upper respiratory specimens during the acute phase of infection. The lowest concentration of SARS-CoV-2 viral copies this assay can detect is 138 copies/mL. A negative result does not preclude SARS-Cov-2 infection and should not be used as the sole basis for treatment or other patient management decisions. A negative result may occur with  improper specimen collection/handling, submission of specimen other than nasopharyngeal swab, presence of viral mutation(s) within the areas targeted by this assay, and inadequate number of viral copies(<138 copies/mL). A negative result must be combined with clinical observations, patient history, and epidemiological information. The expected result is Negative.  Fact Sheet for Patients:  BloggerCourse.com  Fact Sheet for Healthcare Providers:   SeriousBroker.it  This test is no t yet approved or cleared by the [place] FDA and  has been authorized for detection and/or diagnosis of SARS-CoV-2 by FDA under an Emergency Use Authorization (EUA). This EUA will remain  in effect (meaning this test can be used) for the duration of the COVID-19 declaration under Section 564(b)(1) of the Act, 21 U.S.C.section 360bbb-3(b)(1), unless the authorization is terminated  or revoked sooner.  Influenza A by PCR NEGATIVE NEGATIVE Final   Influenza B by PCR NEGATIVE NEGATIVE Final    Comment: (NOTE) The Xpert Xpress SARS-CoV-2/FLU/RSV plus assay is intended as an aid in the diagnosis of influenza from Nasopharyngeal swab specimens and should not be used as a sole basis for treatment. Nasal washings and aspirates are unacceptable for Xpert Xpress SARS-CoV-2/FLU/RSV testing.  Fact Sheet for Patients: BloggerCourse.com  Fact Sheet for Healthcare Providers: SeriousBroker.it  This test is not yet approved or cleared by the [place] FDA and has been authorized for detection and/or diagnosis of SARS-CoV-2 by FDA under an Emergency Use Authorization (EUA). This EUA will remain in effect (meaning this test can be used) for the duration of the COVID-19 declaration under Section 564(b)(1) of the Act, 21 U.S.C. section 360bbb-3(b)(1), unless the authorization is terminated or revoked.     Resp Syncytial Virus by PCR NEGATIVE NEGATIVE Final    Comment: (NOTE) Fact Sheet for Patients: BloggerCourse.com  Fact Sheet for Healthcare Providers: SeriousBroker.it  This test is not yet approved or cleared by the [place] FDA and has been authorized for detection and/or diagnosis of SARS-CoV-2 by FDA under an Emergency Use Authorization (EUA). This EUA will remain in effect (meaning this test can be used) for  the duration of the COVID-19 declaration under Section 564(b)(1) of the Act, 21 U.S.C. section 360bbb-3(b)(1), unless the authorization is terminated or revoked.  Performed at [facility], [address] [place], [place] [address]   Surgical PCR screen     Status: None   Collection Time: 02/10/24 10:21 AM   Specimen: Nasal Mucosa; Nasal Swab  Result Value Ref Range Status   MRSA, PCR NEGATIVE NEGATIVE Final   Staphylococcus aureus NEGATIVE NEGATIVE Final    Comment: (NOTE) The Xpert SA Assay (FDA approved for NASAL specimens in patients 89 years of age and older), is one component of a comprehensive surveillance program. It is not intended to diagnose infection nor to guide or monitor treatment. Performed at [facility], [address] [place], [place] [address]       Radiology Studies last 3 days: DG HIP UNILAT WITH PELVIS 2-3 VIEWS RIGHT Result Date: 02/10/2024 CLINICAL DATA:  Intraoperative fluoroscopy 4 dynamic hip screw, ORIF EXAM: Intraoperative fluoroscopy COMPARISON:  Preop x-ray 02/09/2024. FINDINGS: Four fluoroscopic spot images submitted for review demonstrate placement dynamic hip screw transfixing the proximal intratrochanteric fracture. Long intramedullary rod. Imaging was obtained to aid in treatment. Expected alignment. Please correlate with real-time fluoroscopy of 1 minute and 13 seconds. Cumulative dose 10.09 mGy IMPRESSION: Intraoperative fluoroscopy Electronically Signed   By: Karen Kays M.D.   On: 02/10/2024 15:38   DG C-Arm 1-60 Min-No Report Result Date: 02/10/2024 Fluoroscopy was utilized by the requesting physician.  No radiographic interpretation.   DG C-Arm 1-60 Min-No Report Result Date: 02/10/2024 Fluoroscopy was utilized by the requesting physician.  No radiographic interpretation.   ECHOCARDIOGRAM COMPLETE Result Date: 02/10/2024    ECHOCARDIOGRAM REPORT   Patient Name:   Crystal Bass Date of Exam: 02/10/2024 Medical  Rec #:  [id number]         Height:       62.0 in Accession #:    [id number]        Weight:       125.7 lb Date of Birth:  [birth year]         BSA:          1.569 m Patient Age:    65  years          BP:           119/59 mmHg Patient Gender: F                 HR:           61 bpm. Exam Location:  [facility] Procedure: 2D Echo, Cardiac Doppler and Color Doppler (Both Spectral and Color            Flow Doppler were utilized during procedure). Indications:     Mitral valve disorder I05.9  History:         Patient has prior history of Echocardiogram examinations, most                  recent 09/05/2023. COPD; Risk Factors:Hypertension. History of                  mitral valve repair.                   Mitral Valve: Mitra-Clip valve is present in the mitral                  position.  Sonographer:     Cristela Blue Referring Phys:  [id number] CADENCE H FURTH Diagnosing Phys: Lorine Bears MD IMPRESSIONS  1. Left ventricular ejection fraction, by estimation, is 70 to 75%. The left ventricle has hyperdynamic function. The left ventricle has no regional wall motion abnormalities. There is moderate asymmetric left ventricular hypertrophy of the septal segment. Left ventricular diastolic parameters are indeterminate.  2. Right ventricular systolic function is normal. The right ventricular size is normal. There is moderately elevated pulmonary artery systolic pressure.  3. Left atrial size was moderately dilated.  4. The mitral valve has been repaired/replaced. Moderate mitral valve regurgitation. Mild mitral stenosis. The mean mitral valve gradient is 6.0 mmHg. There is a Mitra-Clip present in the mitral position.  5. The aortic valve is normal in structure. Aortic valve regurgitation is not visualized. No aortic stenosis is present.  6. Moderately dilated pulmonary artery.  7. The inferior vena cava is normal in size with greater than 50% respiratory variability, suggesting right atrial pressure of 3 mmHg. FINDINGS  Left Ventricle: Left  ventricular ejection fraction, by estimation, is 70 to 75%. The left ventricle has hyperdynamic function. The left ventricle has no regional wall motion abnormalities. The left ventricular internal cavity size was normal in size. There is moderate asymmetric left ventricular hypertrophy of the septal segment. Left ventricular diastolic parameters are indeterminate. Right Ventricle: The right ventricular size is normal. No increase in right ventricular wall thickness. Right ventricular systolic function is normal. There is moderately elevated pulmonary artery systolic pressure. The tricuspid regurgitant velocity is 3.34 m/s, and with an assumed right atrial pressure of 3 mmHg, the estimated right ventricular systolic pressure is 47.6 mmHg. Left Atrium: Left atrial size was moderately dilated. Right Atrium: Right atrial size was normal in size. Pericardium: There is no evidence of pericardial effusion. Mitral Valve: The mitral valve has been repaired/replaced. Moderate mitral valve regurgitation. There is a Mitra-Clip present in the mitral position. Mild mitral valve stenosis. MV peak gradient, 13.1 mmHg. The mean mitral valve gradient is 6.0 mmHg. Tricuspid Valve: The tricuspid valve is normal in structure. Tricuspid valve regurgitation is mild . No evidence of tricuspid stenosis. Aortic Valve: The aortic valve is normal in structure. Aortic valve regurgitation is not visualized. No aortic stenosis is present. Aortic valve mean gradient measures  3.0 mmHg. Aortic valve peak gradient measures 5.2 mmHg. Aortic valve area, by VTI measures 4.39 cm. Pulmonic Valve: The pulmonic valve was normal in structure. Pulmonic valve regurgitation is trivial. No evidence of pulmonic stenosis. Aorta: The aortic root is normal in size and structure. Pulmonary Artery: The pulmonary artery is moderately dilated. Venous: The inferior vena cava is normal in size with greater than 50% respiratory variability, suggesting right atrial  pressure of 3 mmHg. IAS/Shunts: No atrial level shunt detected by color flow Doppler.  LEFT VENTRICLE PLAX 2D LVIDd:         5.20 cm   Diastology LVIDs:         2.40 cm   LV e' medial:    4.79 cm/s LV PW:         1.10 cm   LV E/e' medial:  34.0 LV IVS:        1.70 cm   LV e' lateral:   6.64 cm/s LVOT diam:     2.55 cm   LV E/e' lateral: 24.5 LV SV:         76 LV SV Index:   48 LVOT Area:     5.11 cm  RIGHT VENTRICLE RV Basal diam:  2.50 cm RV Mid diam:    3.20 cm RV S prime:     11.30 cm/s TAPSE (M-mode): 1.6 cm LEFT ATRIUM             Index        RIGHT ATRIUM           Index LA diam:        4.40 cm 2.80 cm/m   RA Area:     13.90 cm LA Vol (A2C):   61.1 ml 38.94 ml/m  RA Volume:   36.10 ml  23.01 ml/m LA Vol (A4C):   64.8 ml 41.30 ml/m LA Biplane Vol: 64.3 ml 40.98 ml/m  AORTIC VALVE AV Area (Vmax):    3.36 cm AV Area (Vmean):   3.85 cm AV Area (VTI):     4.39 cm AV Vmax:           114.00 cm/s AV Vmean:          72.400 cm/s AV VTI:            0.172 m AV Peak Grad:      5.2 mmHg AV Mean Grad:      3.0 mmHg LVOT Vmax:         75.00 cm/s LVOT Vmean:        54.600 cm/s LVOT VTI:          0.148 m LVOT/AV VTI ratio: 0.86  AORTA Ao Root diam: 3.30 cm MITRAL VALVE                TRICUSPID VALVE MV Area (PHT): 1.65 cm     TR Peak grad:   44.6 mmHg MV Area VTI:   1.24 cm     TR Vmax:        334.00 cm/s MV Peak grad:  13.1 mmHg MV Mean grad:  6.0 mmHg     SHUNTS MV Vmax:       1.81 m/s     Systemic VTI:  0.15 m MV Vmean:      111.0 cm/s   Systemic Diam: 2.55 cm MV Decel Time: 460 msec MV E velocity: 163.00 cm/s MV A velocity: 88.60 cm/s MV E/A ratio:  1.84 Lorine Bears MD Electronically signed by Lorine Bears  MD Signature Date/Time: 02/10/2024/9:07:50 AM    Final    DG Chest 1 View Result Date: 02/09/2024 CLINICAL DATA:  Larey Seat, pain EXAM: CHEST  1 VIEW COMPARISON:  09/04/2023 FINDINGS: Single frontal view of the chest demonstrates stable enlargement of the cardiac silhouette. Postsurgical changes are seen  from mitral valve repair. There is increased consolidation within the medial right upper lung zone since prior study, consistent with airspace disease superimposed upon chronic atelectasis and bronchiectasis seen on prior CTs. Increased pulmonary vascular congestion. No effusion or pneumothorax. No acute bony abnormalities. IMPRESSION: 1. Enlarged cardiac silhouette. 2. Pulmonary vascular congestion. Increased consolidation within the medial right upper lung zone may reflect airspace disease superimposed upon known chronic scarring and bronchiectasis. Electronically Signed   By: Sharlet Salina M.D.   On: 02/09/2024 17:44   DG Hip Unilat W or Wo Pelvis 2-3 Views Right Result Date: 02/09/2024 CLINICAL DATA:  Right hip pain EXAM: DG HIP (WITH OR WITHOUT PELVIS) 2-3V RIGHT COMPARISON:  09/22/2022 FINDINGS: Acute, mildly comminuted intertrochanteric fracture of the proximal right femur with mild displacement and varus angulation. Hip joint alignment is maintained without dislocation. Prior left hip arthroplasty. Bony pelvis intact without evidence of fracture or diastasis. Soft tissue swelling overlies the right hip laterally. IMPRESSION: Acute, mildly comminuted intertrochanteric fracture of the proximal right femur. Electronically Signed   By: Duanne Guess D.O.   On: 02/09/2024 17:43   DG Wrist Complete Right Result Date: 02/09/2024 CLINICAL DATA:  Larey Seat, right wrist pain EXAM: RIGHT WRIST - COMPLETE 3+ VIEW COMPARISON:  None Available. FINDINGS: Frontal, oblique, lateral, and ulnar deviated views of the right wrist are obtained. There is an impacted extra-articular fracture through the distal right radial metaphysis, with mild impaction at the fracture site. Anatomic alignment of the radiocarpal joint. Prominent osteoarthritis within the radial aspect of the carpus and at the first carpometacarpal joint. Mild soft tissue swelling. IMPRESSION: 1. Impacted extra-articular fracture of the distal radial  metaphysis, with near anatomic alignment. 2. 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 3. Soft tissue swelling. Electronically Signed   By: Sharlet Salina M.D.   On: 02/09/2024 17:41         Sunnie Nielsen, DO [facility] 02/12/2024, 3:46 PM    Dictation software may have been used to generate the above note. Typos may occur and escape review in typed/dictated notes. Please contact Dr Lyn Hollingshead directly for clarity if needed.  Staff may message me via secure chat in Epic  but this may not receive an immediate response,  please page me for urgent matters!  If 7PM-7AM, please contact night coverage [email or web address]

## 2024-02-12 NOTE — Care Management Important Message (Signed)
 Important Message  Patient Details  Name: Crystal Bass MRN: [id number] Date of Birth: 18-Dec-1940   Important Message Given:  Yes - Medicare IM     Cristela Blue, CMA 02/12/2024, 1:22 PM

## 2024-02-12 NOTE — Progress Notes (Signed)
 Physical Therapy Treatment Patient Details Name: Crystal Bass MRN: [id number] DOB: 1941-07-14 Today's Date: 02/12/2024   History of Present Illness Pt is an 83 y.o. female with medical history significant for Parkinson's disease, COPD/bronchiectasis, mitral regurgitation s/p MitraClip in 2023 with redo 03/2023, diastolic CHF history of PE in 4098, A-fib in October s/p cardioversion being admitted with an accidental fall in which she sustained a right intertrochanteric fracture and a buckle fracture of the right distal radius. Radius fracture to be managed conservatively with pt now s/p reduction and internal fixation of R hip fracture with TFN nail.    PT Comments  Pt remained lethargic this session but grossly improved compared to prior session with no repetitive verbalizations and with occasional communication in full sentences.  Pt continued to require total assist with bed mobility tasks and was unable to clear the surface of the bed during transfers attempts with HHA.  Pt presented with R lateral lean in sitting initially but static sitting balance improved with L lateral weight shifting activities.  Pt able to grip with her L hand this session but continued to demonstrate limited movement of her L leg and as well as to present with L foot PF contracture, MD notified. Attempted to reach family by phone to clarify baseline status but unable to reach.  Of note, pt found on room air with SpO2 86%.  Nursing in room and requested pt be put back on 2LO2 with pt's SpO2 returning quickly back to >/=92%.  Pt will benefit from continued PT services upon discharge to safely address deficits listed in patient problem list for decreased caregiver assistance and eventual return to PLOF.     If plan is discharge home, recommend the following: Two people to help with walking and/or transfers;A lot of help with bathing/dressing/bathroom;Assistance with cooking/housework;Assistance with feeding;Direct  supervision/assist for medications management;Assist for transportation   Can travel by private vehicle     No  Equipment Recommendations  Other (comment) (TBD at next venue of care)    Recommendations for Other Services       Precautions / Restrictions Precautions Precautions: Fall Recall of Precautions/Restrictions: Impaired Required Braces or Orthoses: Splint/Cast Splint/Cast: R wrist Restrictions Weight Bearing Restrictions Per Provider Order: Yes RUE Weight Bearing Per Provider Order: Non weight bearing RLE Weight Bearing Per Provider Order: Weight bearing as tolerated Other Position/Activity Restrictions: OK for RUE PF walker     Mobility  Bed Mobility Overal bed mobility: Needs Assistance Bed Mobility: Supine to Sit, Sit to Supine     Supine to sit: Total assist, +2 for physical assistance Sit to supine: Total assist, +2 for physical assistance   General bed mobility comments: Pt did not provide physical assist or attempt to initiate tasks.    Transfers Overall transfer level: Needs assistance Equipment used: 1 person hand held assist               General transfer comment: Multiple attempts made to come to standing with heavy cuing for sequencing but with pt putting forth no visible effort and unable to clear the surface of the bed    Ambulation/Gait                   Stairs             Wheelchair Mobility     Tilt Bed    Modified Rankin (Stroke Patients Only)       Balance Overall balance assessment: Needs assistance, History of Falls  Sitting balance-Leahy Scale: Poor Sitting balance - Comments: R lateral lean that improved with L lateral weight shifting activities Postural control: Right lateral lean     Standing balance comment: Unable to assess                            Communication Communication Communication: Impaired Factors Affecting Communication: Difficulty expressing self  Cognition Arousal:  Lethargic Behavior During Therapy: Flat affect   PT - Cognitive impairments: No family/caregiver present to determine baseline                       PT - Cognition Comments: Limited communication verbally but minimally improved compared to prior session Following commands: Impaired Following commands impaired: Follows one step commands inconsistently    Cueing Cueing Techniques: Verbal cues, Tactile cues, Gestural cues  Exercises Total Joint Exercises Ankle Circles/Pumps: AAROM, Both, 5 reps, 10 reps (Gentle stretching at end of range into DF 3 x 30 sec on the LLE) Heel Slides: AAROM, Both, 5 reps, PROM (low amplitude to tolerance) Hip ABduction/ADduction: PROM, AAROM, Both, 5 reps (low amplitude to tolerance) Straight Leg Raises: PROM, AAROM, Both, 5 reps (low amplitude to tolerance) Long Arc Quad: AAROM, AROM, Both, 10 reps (small amplitude movements only) Other Exercises Other Exercises: L lateral weight shifting activities in sitting at the EOB to address R lateral lean    General Comments        Pertinent Vitals/Pain Pain Assessment Pain Assessment: PAINAD Breathing: occasional labored breathing, short period of hyperventilation Negative Vocalization: occasional moan/groan, low speech, negative/disapproving quality Facial Expression: sad, frightened, frown Body Language: tense, distressed pacing, fidgeting Consolability: distracted or reassured by voice/touch PAINAD Score: 5 Pain Intervention(s): Premedicated before session, Repositioned, Monitored during session    Home Living                          Prior Function            PT Goals (current goals can now be found in the care plan section) Progress towards PT goals: Progressing toward goals    Frequency    Min 3X/week      PT Plan      Co-evaluation              AM-PAC PT "6 Clicks" Mobility   Outcome Measure  Help needed turning from your back to your side while in a flat  bed without using bedrails?: Total Help needed moving from lying on your back to sitting on the side of a flat bed without using bedrails?: Total Help needed moving to and from a bed to a chair (including a wheelchair)?: Total Help needed standing up from a chair using your arms (e.g., wheelchair or bedside chair)?: Total Help needed to walk in hospital room?: Total Help needed climbing 3-5 steps with a railing? : Total 6 Click Score: 6    End of Session Equipment Utilized During Treatment: Gait belt;Oxygen Activity Tolerance: Patient limited by pain;Patient limited by lethargy Patient left: in bed;with call bell/phone within reach;with bed alarm set;with SCD's reapplied Nurse Communication: Mobility status;Other (comment) (Pt left on 2LO2/min per nursing request) PT Visit Diagnosis: History of falling (Z91.81);Other abnormalities of gait and mobility (R26.89);Muscle weakness (generalized) (M62.81);Pain Pain - Right/Left: Right Pain - part of body: Hip;Hand     Time: 1610-9604 PT Time Calculation (min) (ACUTE ONLY): 35 min  Charges:    $  Therapeutic Exercise: 8-22 mins $Therapeutic Activity: 8-22 mins PT General Charges $$ ACUTE PT VISIT: 1 Visit                     D. Elly Modena PT, DPT 02/12/24, 4:42 PM

## 2024-02-12 NOTE — Plan of Care (Signed)
  Problem: Education: Goal: Knowledge of General Education information will improve Description: Including pain rating scale, medication(s)/side effects and non-pharmacologic comfort measures Outcome: Not Applicable   Problem: Health Behavior/Discharge Planning: Goal: Ability to manage health-related needs will improve Outcome: Not Applicable

## 2024-02-12 NOTE — Consult Note (Signed)
 [facility] Hospital Liaison Note  02/12/2024  Crystal Bass 01/02/1941 [id number]  Location: RN Hospital Liaison met patient at bedside at [facility].  Insurance: [facility] [employer]   [name] is a 83 y.o. female who is a Primary Care Patient of Beamer, Benetta Spar, MD [facility]. The patient was screened for  readmission hospitalization with noted high risk score for unplanned readmission risk with 2 IP in 6 months.  The patient was assessed for potential Care Management service needs for post hospital transition for care coordination. Review of patient's electronic medical record reveals patient was admitted with Closed Right Hip Fracture. Pt recommended for SNF level of care. Upon discharged to a SNF level of care the facility will continue to address pt's only needs. Liaison attempt visited at bedside today to introduce and education on VBCI services for post SNF to community however pt not available in patient care with no family at bedside at the time of this visit.   Plan: [facility] Liaison will continue to follow progress and disposition to asess for post hospital community care coordination/management needs.  Referral request for community care coordination: pending disposition.   VBCI Care Management/Population Health does not replace or interfere with any arrangements made by the Inpatient Transition of Care team.   For questions contact:   Elliot Cousin, RN, BSN Hospital Liaison [place]   [facility], Population Health Office Hours MTWF  8:00 am-6:00 pm Direct Dial: [phone] mobile [email or web address]

## 2024-02-13 DIAGNOSIS — J479 Bronchiectasis, uncomplicated: Secondary | ICD-10-CM | POA: Diagnosis not present

## 2024-02-13 DIAGNOSIS — I110 Hypertensive heart disease with heart failure: Secondary | ICD-10-CM | POA: Diagnosis not present

## 2024-02-13 DIAGNOSIS — G20A1 Parkinson's disease without dyskinesia, without mention of fluctuations: Secondary | ICD-10-CM | POA: Diagnosis not present

## 2024-02-13 DIAGNOSIS — Z9889 Other specified postprocedural states: Secondary | ICD-10-CM | POA: Diagnosis not present

## 2024-02-13 DIAGNOSIS — I4891 Unspecified atrial fibrillation: Secondary | ICD-10-CM | POA: Diagnosis not present

## 2024-02-13 DIAGNOSIS — S72141D Displaced intertrochanteric fracture of right femur, subsequent encounter for closed fracture with routine healing: Secondary | ICD-10-CM | POA: Diagnosis not present

## 2024-02-13 DIAGNOSIS — S72002D Fracture of unspecified part of neck of left femur, subsequent encounter for closed fracture with routine healing: Secondary | ICD-10-CM | POA: Diagnosis not present

## 2024-02-13 DIAGNOSIS — Z7189 Other specified counseling: Secondary | ICD-10-CM | POA: Diagnosis not present

## 2024-02-13 DIAGNOSIS — G8929 Other chronic pain: Secondary | ICD-10-CM | POA: Diagnosis not present

## 2024-02-13 DIAGNOSIS — J309 Allergic rhinitis, unspecified: Secondary | ICD-10-CM | POA: Diagnosis not present

## 2024-02-13 DIAGNOSIS — R238 Other skin changes: Secondary | ICD-10-CM | POA: Diagnosis not present

## 2024-02-13 DIAGNOSIS — R443 Hallucinations, unspecified: Secondary | ICD-10-CM | POA: Diagnosis not present

## 2024-02-13 DIAGNOSIS — R262 Difficulty in walking, not elsewhere classified: Secondary | ICD-10-CM | POA: Diagnosis not present

## 2024-02-13 DIAGNOSIS — Z66 Do not resuscitate: Secondary | ICD-10-CM | POA: Diagnosis not present

## 2024-02-13 DIAGNOSIS — S52501D Unspecified fracture of the lower end of right radius, subsequent encounter for closed fracture with routine healing: Secondary | ICD-10-CM | POA: Diagnosis not present

## 2024-02-13 DIAGNOSIS — M6281 Muscle weakness (generalized): Secondary | ICD-10-CM | POA: Diagnosis not present

## 2024-02-13 DIAGNOSIS — I5032 Chronic diastolic (congestive) heart failure: Secondary | ICD-10-CM | POA: Diagnosis not present

## 2024-02-13 DIAGNOSIS — R300 Dysuria: Secondary | ICD-10-CM | POA: Diagnosis not present

## 2024-02-13 DIAGNOSIS — M810 Age-related osteoporosis without current pathological fracture: Secondary | ICD-10-CM | POA: Diagnosis not present

## 2024-02-13 DIAGNOSIS — J449 Chronic obstructive pulmonary disease, unspecified: Secondary | ICD-10-CM | POA: Diagnosis not present

## 2024-02-13 DIAGNOSIS — R54 Age-related physical debility: Secondary | ICD-10-CM | POA: Diagnosis not present

## 2024-02-13 DIAGNOSIS — S52531D Colles' fracture of right radius, subsequent encounter for closed fracture with routine healing: Secondary | ICD-10-CM | POA: Diagnosis not present

## 2024-02-13 DIAGNOSIS — Z95818 Presence of other cardiac implants and grafts: Secondary | ICD-10-CM | POA: Diagnosis not present

## 2024-02-13 DIAGNOSIS — R82998 Other abnormal findings in urine: Secondary | ICD-10-CM | POA: Diagnosis not present

## 2024-02-13 DIAGNOSIS — Z7401 Bed confinement status: Secondary | ICD-10-CM | POA: Diagnosis not present

## 2024-02-13 DIAGNOSIS — R0902 Hypoxemia: Secondary | ICD-10-CM | POA: Diagnosis not present

## 2024-02-13 DIAGNOSIS — E43 Unspecified severe protein-calorie malnutrition: Secondary | ICD-10-CM | POA: Diagnosis not present

## 2024-02-13 DIAGNOSIS — S72001A Fracture of unspecified part of neck of right femur, initial encounter for closed fracture: Secondary | ICD-10-CM | POA: Diagnosis not present

## 2024-02-13 DIAGNOSIS — I10 Essential (primary) hypertension: Secondary | ICD-10-CM | POA: Diagnosis not present

## 2024-02-13 DIAGNOSIS — M792 Neuralgia and neuritis, unspecified: Secondary | ICD-10-CM | POA: Diagnosis not present

## 2024-02-13 LAB — CBC
HCT: 25.7 % — ABNORMAL LOW (ref 36.0–46.0)
Hemoglobin: 8.4 g/dL — ABNORMAL LOW (ref 12.0–15.0)
MCH: 32.6 pg (ref 26.0–34.0)
MCHC: 32.7 g/dL (ref 30.0–36.0)
MCV: 99.6 fL (ref 80.0–100.0)
Platelets: 114 10*3/uL — ABNORMAL LOW (ref 150–400)
RBC: 2.58 MIL/uL — ABNORMAL LOW (ref 3.87–5.11)
RDW: 13.6 % (ref 11.5–15.5)
WBC: 9 10*3/uL (ref 4.0–10.5)
nRBC: 0 % (ref 0.0–0.2)

## 2024-02-13 MED ORDER — ADULT MULTIVITAMIN W/MINERALS CH: 1.0000 | ORAL_TABLET | Freq: Every day | ORAL | Status: DC

## 2024-02-13 MED ORDER — HYDROCODONE-ACETAMINOPHEN 5-325 MG PO TABS: 1.0000 | ORAL_TABLET | Freq: Four times a day (QID) | ORAL | 0 refills | Status: DC | PRN

## 2024-02-13 MED ORDER — ONDANSETRON HCL 4 MG PO TABS: 4.0000 mg | ORAL_TABLET | Freq: Four times a day (QID) | ORAL | Status: DC | PRN

## 2024-02-13 MED ORDER — DIPHENHYDRAMINE HCL 12.5 MG/5ML PO ELIX: 12.5000 mg | ORAL_SOLUTION | ORAL | 0 refills | Status: DC | PRN

## 2024-02-13 MED ORDER — METHOCARBAMOL 500 MG PO TABS: 500.0000 mg | ORAL_TABLET | Freq: Three times a day (TID) | ORAL | Status: DC | PRN

## 2024-02-13 MED ORDER — FLEET ENEMA RE ENEM: 1.0000 | ENEMA | Freq: Once | RECTAL | Status: DC | PRN

## 2024-02-13 MED ORDER — CARBIDOPA-LEVODOPA ER 25-100 MG PO TBCR
2.0000 | EXTENDED_RELEASE_TABLET | Freq: Two times a day (BID) | ORAL | Status: DC
Start: 2024-02-13 — End: 2024-03-06

## 2024-02-13 MED ORDER — CARBIDOPA-LEVODOPA ER 25-100 MG PO TBCR: 1.0000 | EXTENDED_RELEASE_TABLET | Freq: Every day | ORAL | Status: DC

## 2024-02-13 NOTE — Plan of Care (Signed)
?  Problem: Clinical Measurements: ?Goal: Will remain free from infection ?Outcome: Progressing ?Goal: Diagnostic test results will improve ?Outcome: Progressing ?Goal: Respiratory complications will improve ?Outcome: Progressing ?  ?Problem: Activity: ?Goal: Risk for activity intolerance will decrease ?Outcome: Not Progressing ?  ?

## 2024-02-13 NOTE — Progress Notes (Signed)
 Subjective: 3 Days Post-Op Procedure(s) (LRB): FIXATION, FRACTURE, INTERTROCHANTERIC, WITH INTRAMEDULLARY ROD (Right) Right distal radius fracture, being treated non-op Patient reports pain as mild.  Sleeping well when I enter the room this AM. Patient is well, and has had no acute complaints or problems Plan is for SNF following discharge. Negative for chest pain and shortness of breath Fever: no Gastrointestinal:Negative for nausea and vomiting this AM.  Objective: Vital signs in last 24 hours: Temp:  [97.6 F (36.4 C)-98.6 F (37 C)] 97.6 F (36.4 C) (03/20 0533) Pulse Rate:  [62-71] 63 (03/20 0533) Resp:  [15-17] 16 (03/20 0533) BP: (116-125)/(62-74) 125/74 (03/20 0533) SpO2:  [97 %-100 %] 99 % (03/20 0533)  Intake/Output from previous day: No intake or output data in the 24 hours ending 02/13/24 0654   Intake/Output this shift: No intake/output data recorded.  Labs: Recent Labs    02/10/24 0855 02/11/24 0354 02/11/24 1335 02/12/24 0150 02/13/24 0550  HGB 12.6 9.3* 9.3* 8.1* 8.4*   Recent Labs    02/12/24 0150 02/13/24 0550  WBC 10.2 9.0  RBC 2.49* 2.58*  HCT 25.1* 25.7*  PLT 98* 114*   Recent Labs    02/11/24 0354 02/12/24 0150  NA 136 135  K 4.2 4.2  CL 98 100  CO2 29 31  BUN 31* 40*  CREATININE 1.14* 1.00  GLUCOSE 143* 105*  CALCIUM 9.0 8.7*   No results for input(s): "LABPT", "INR" in the last 72 hours.    EXAM General - Patient is Alert and Appropriate.  Able to answer my questions but does take awhile to respond. Extremity - Right wrist splint intact.  Able to flex and extend fingers.  Towel applied to elevate the right wrist. Honeycomb dressings intact without drainage.  No signs of active bleeding. Right thigh is soft.  Swelling improving. Able to dorsiflex and plantarflex the right foot. Dressing/Incision - clean, dry, no drainage Motor Function - intact, moving foot and toes well on exam.   Past Medical History:  Diagnosis Date    Aneurysm of aorta (HCC)    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   Anginal pain (HCC)    a. 10/2021 Cath: Nl cors.   Arthritis    Asthma    Breast cancer (HCC) 2002   infiltrative ductal carcinoma    Cataract 2019   Chronic heart failure with preserved ejection fraction (HFpEF) (HCC)    COPD (chronic obstructive pulmonary disease) (HCC) 07/2007   Coronary artery disease    Ductal carcinoma of breast, estrogen receptor positive, stage 1 (HCC) 09/16/2012   Endometriosis    GERD (gastroesophageal reflux disease)    Hx of mitral valve repair    Hypercalcemia 09/14/2013   Hypertension    Lesion of breast 1992   right, benign   Lung disease    secondary to [name] infection   Mastalgia 05/1994   Mitral regurgitation    a. 10/2021 RHC: PCWP V wave of 34; b. 01/2022 TEE: Severe flail/prolapse of large area of A2 scallop with severe posteriorly directed MR; c. 01/2022 TEER w /Mitraclip XTW x 2; d. 07/2022 Echo: EF 60-65%. No rwma, mild LVH, GrIII DD. Sev dil LA. Mod elev PASP. Mod-Sev MR. Mod MS.   Moderate mitral regurgitation by prior echocardiogram    Osteoporosis, post-menopausal 03/14/2012   Ovarian tumor of borderline malignancy, right 2004   Palpitations    a. 02/2022 Zio: Predominantly sinus rhythm at 67 (52-171).  2 runs of NSVT -fastest 9 beats  at 167.  35 SVT runs-longest 19 beats, fastest 171x6 beats.   Pancreatitis    secondary to Cholelithiasis   Parkinson disease (HCC) 02/10/2015   Post-thoracotomy pain syndrome    Pseudomonas pneumonia (HCC) 03/2008   Status post implantation of mitral valve leaflet clip 02/15/2022   s/p XTW MitraClip x2 by Dr. Excell Seltzer in the A2/P2: b. 04/03/23 s/p third mitraclip implantation.   Traumatic closed fracture of distal clavicle with minimal displacement, left, initial encounter 06/2021   [facility]   Uterine fibroid    Vitamin D deficiency     Assessment/Plan: 3 Days Post-Op Procedure(s) (LRB): FIXATION, FRACTURE, INTERTROCHANTERIC, WITH  INTRAMEDULLARY ROD (Right) Principal Problem:   Closed right hip fracture, initial encounter [facility]) Active Problems:   History of pulmonary embolism 2019   Chronic heart failure with preserved ejection fraction (HFpEF) (HCC)   Atrial fibrillation s/p cardioversion 08/2023 [facility])   Closed right radial fracture   Elevated troponin   Protein-calorie malnutrition, severe  Estimated body mass index is 22.98 kg/m as calculated from the following:   Height as of this encounter: 5\' 2"  (1.575 m).   Weight as of this encounter: 57 kg. Advance diet Up with therapy D/C IV fluids when tolerating po intake.  Labs and vitals reviewed. Hg trending up to 8.4, consider transfusion if continuing to trend down to 7. HCT 25.7, Platelets 114. Up with therapy today.  Plan for SNF following discharge. Patient has had a BM.  Following discharge, follow-up with [facility] in 10-14 days for staple removal and repeat x-rays of the right femur and right wrist. Continue Eliquis following discharge. Ortho will sign off at this time.  DVT Prophylaxis - TED hose and Eliquis Weight-Bearing as tolerated to right leg NWB to the right arm.  Valeria Batman, PA-C [facility] 02/13/2024, 6:54 AM

## 2024-02-13 NOTE — Progress Notes (Signed)
 PT Cancellation Note  Patient Details Name: Crystal Bass MRN: [id number] DOB: Sep 05, 1941   Cancelled Treatment:    Reason Eval/Treat Not Completed: Other (comment).  Chart reviewed and attempted to see pt.  Pt is being discharged today and pt noting she did not want to participate in therapy.  Pt repeatedly states "I want to go and I want to leave now.  I have to leave now".  Will re-attempt as medically appropriate while in hospital.    Nolon Bussing, PT, DPT Physical Therapist - [facility]  02/13/24, 11:23 AM

## 2024-02-13 NOTE — Progress Notes (Addendum)
 1054 Report given to [facility] from [place] at this time. All questions and opportunities answered.   1059 EMS called,  DNR and pain prescription paper placed in d/c packet. Pt ready for d/c

## 2024-02-13 NOTE — Discharge Summary (Signed)
 Physician Discharge Summary   Patient: Crystal Bass MRN: [id number]  DOB: 12/16/1940   Admit:     Date of Admission: 02/09/2024 Admitted from: [facility] assisted living    Discharge: Date of discharge: 02/13/24 Disposition: Skilled nursing facility Condition at discharge: good  CODE STATUS: DNR     Discharge Physician: Sunnie Nielsen, DO [facility]     PCP: Earnestine Mealing, MD  Recommendations for Outpatient Follow-up:  Follow up with PCP Earnestine Mealing, MD in 1-2 weeks Follow-up with [facility] in 10-14 days for staple removal and repeat x-rays of the right femur and right wrist.   Discharge Instructions     Call MD for:  redness, tenderness, or signs of infection (pain, swelling, redness, odor or green/yellow discharge around incision site)   Complete by: As directed    Diet general   Complete by: As directed    Increase activity slowly   Complete by: As directed          Discharge Diagnoses: Principal Problem:   Closed right hip fracture, initial encounter [facility]) Active Problems:   Chronic heart failure with preserved ejection fraction (HFpEF) (HCC)   Atrial fibrillation s/p cardioversion 08/2023 (HCC)   Elevated troponin   History of pulmonary embolism 2019   Closed right radial fracture   Protein-calorie malnutrition, severe     Hospital course / significant events:   HPI: 83 y.o. female with medical history significant for Parkinson's disease, COPD/bronchiectasis, mitral regurgitation s/p MitraClip in 2023 with redo 03/2023, diastolic CHF history of PE in 0865, A-fib in 08/2023 s/p cardioversion, on Eliquis and amiodarone    03/16: admitted with an accidental fall fall in which she sustained a right intertrochanteric fracture and a buckle fracture of the right distal radius.  Dr. Joice Lofts is aware and will take patient to the OR on 3/17  3/17: Cardiology for preop clearance. Echo done.S/p hip surgery 3/18: palliative care consult  for goals of care 3/19: awaiting placement  3/20: SNF placement confirmed    Consultants:  Orthopedics Cardiology  Palliative Care  Procedures/Surgeries: 03/17: FIXATION, FRACTURE, INTERTROCHANTERIC, WITH INTRAMEDULLARY ROD (Right)       ASSESSMENT & PLAN:   Closed right hip fracture, initial encounter Secondary to accidental mechanical fall S/p FIXATION, FRACTURE, INTERTROCHANTERIC, WITH INTRAMEDULLARY ROD (Right) on 3/17 DVT prophylaxis Eliquis pain management per orthopedics - Rx printed WBAT to R leg follow-up with [facility] in 10-14 days for staple removal and repeat x-rays of the right femur and right wrist.    Atrial fibrillation s/p cardioversion 08/2023  Eliquis amiodarone   Chronic heart failure with preserved ejection fraction (HFpEF)  Mild fluid overload on admission 2L O2 requirement, have reasonably excluded PNA Diuresis w/ home lasix resumed  May continue O2 at SNF and wean as able   Elevated troponin demand ischemia Have reasonably excluded ACS Recheck as needed    Closed right radial fracture Buckle fracture.   Manage per orthopedics NWB R arm Pain control    COPD (chronic obstructive pulmonary disease) without exacerbation  Continue home inhalers   History of pulmonary embolism Eliquis    S/P mitral valve clip implantation S/p MitraClip's x 2 due to inadequate symptomatic improvement after first MitraClip in 2023 No acute issues suspected   Parkinson disease Continue Sinemet.   Frailty Had prior left hip fracture PT recommends SNF Ambulates with rollator/walker at baseline .     [facility] Palliative care and hospitalist team have had several discussions with son  and niece - they're not interested in Hospice and feels patient's problem is transient.    No concerns based on BMI: Body mass index is 22.98 kg/m.  Underweight - under 18  overweight - 25 to 29 obese - 30 or more Class 1 obesity: BMI of 30.0 to 34 Class 2 obesity: BMI of  35.0 to 39 Class 3 obesity: BMI of 40.0 to 49 Super Morbid Obesity: BMI 50-59 Super-super Morbid Obesity: BMI 60+ Significantly low or high BMI is associated with higher medical risk.  Weight management advised as adjunct to other disease management and risk reduction treatments    DVT prophylaxis: Eliquis IV fluids: no continuous IV fluids  Nutrition: regular diet  Central lines / invasive devices: none  Code Status: DNR ACP documentation reviewed:  Advanced Directive, DNR and MOST orders are on file in [facility]           Discharge Instructions  Allergies as of 02/13/2024       Reactions   Sulfa Antibiotics Anaphylaxis   Sulfonamide Derivatives Anaphylaxis   Topamax [topiramate] Other (See Comments)   Metabolic acidosis    Clarithromycin Other (See Comments)   pericarditis Other reaction(s): Not available   Hydrocodone Other (See Comments)   "hyper and climbing the walls"   Motrin [ibuprofen] Other (See Comments)   headaches   Misc. Sulfonamide Containing Compounds    Other reaction(s): Not available   Symbicort [budesonide-formoterol Fumarate] Other (See Comments)   02/07/15 tremor   Tetracycline Hcl Other (See Comments)   Percocet [oxycodone-acetaminophen] Itching, Rash   Tape Itching, Rash   Use paper tape only   Tetracyclines & Related Other (See Comments)   "immediate yeast infection"        Medication List     STOP taking these medications    aluminum-magnesium hydroxide 200-200 MG/5ML suspension   clonazepam 0.125 MG disintegrating tablet Commonly known as: KLONOPIN   fexofenadine 30 MG/5ML suspension Commonly known as: ALLEGRA       TAKE these medications    acetaminophen 500 MG tablet Commonly known as: TYLENOL Take 1,000 mg by mouth 3 (three) times daily.   albuterol 108 (90 Base) MCG/ACT inhaler Commonly known as: VENTOLIN HFA Inhale 2 puffs into the lungs every 6 (six) hours as needed for wheezing or shortness of breath.    amiodarone 100 MG tablet Commonly known as: PACERONE Take 100 mg by mouth daily.   apixaban 2.5 MG Tabs tablet Commonly known as: ELIQUIS Take 1 tablet (2.5 mg total) by mouth 2 (two) times daily.   bisacodyl 10 MG suppository Commonly known as: DULCOLAX Place 10 mg rectally as needed for moderate constipation.   calcium carbonate 1500 (600 Ca) MG Tabs tablet Commonly known as: OSCAL Take 600 mg of elemental calcium by mouth daily.   calcium carbonate 500 MG chewable tablet Commonly known as: TUMS - dosed in mg elemental calcium Chew 2 tablets by mouth daily as needed for indigestion or heartburn.   Carbidopa-Levodopa ER 25-100 MG tablet controlled release Commonly known as: SINEMET CR Take 2 tablets by mouth 2 (two) times daily. What changed:  how much to take how to take this when to take this additional instructions   Carbidopa-Levodopa ER 25-100 MG tablet controlled release Commonly known as: SINEMET CR Take 1 tablet by mouth daily before supper. What changed: You were already taking a medication with the same name, and this prescription was added. Make sure you understand how and when to take each.   diphenhydrAMINE  12.5 MG/5ML elixir Commonly known as: BENADRYL Take 5-10 mLs (12.5-25 mg total) by mouth every 4 (four) hours as needed for itching.   docusate sodium 100 MG capsule Commonly known as: COLACE Take 100 mg by mouth 2 (two) times daily.   EPINEPHrine 0.3 mg/0.3 mL Soaj injection Commonly known as: EPI-PEN Inject 0.3 mg into the muscle as needed for anaphylaxis.   fluticasone 50 MCG/ACT nasal spray Commonly known as: FLONASE Place 2 sprays into both nostrils daily as needed.   furosemide 40 MG tablet Commonly known as: LASIX TAKE ONE TABLET BY MOUTH ONCE DAILY *NEW DOSE*   HYDROcodone-acetaminophen 5-325 MG tablet Commonly known as: NORCO/VICODIN Take 1-2 tablets by mouth every 6 (six) hours as needed for moderate pain (pain score 4-6) or severe  pain (pain score 7-10).   magnesium hydroxide 400 MG/5ML suspension Commonly known as: MILK OF MAGNESIA Take 30 mLs by mouth daily as needed for mild constipation.   methocarbamol 500 MG tablet Commonly known as: ROBAXIN Take 1 tablet (500 mg total) by mouth every 8 (eight) hours as needed for muscle spasms.   multivitamin with minerals Tabs tablet Take 1 tablet by mouth daily.   nitroGLYCERIN 0.4 MG SL tablet Commonly known as: NITROSTAT Place 0.4 mg under the tongue every 5 (five) minutes as needed for chest pain.   ondansetron 4 MG tablet Commonly known as: ZOFRAN Take 1 tablet (4 mg total) by mouth every 6 (six) hours as needed for nausea.   pantoprazole 20 MG tablet Commonly known as: Protonix Take 1 tablet (20 mg total) by mouth daily.   polyethylene glycol 17 g packet Commonly known as: MIRALAX / GLYCOLAX Take 17 g by mouth daily as needed.   sodium phosphate Enem Place 133 mLs (1 enema total) rectally once as needed for severe constipation.   Trelegy Ellipta 100-62.5-25 MCG/ACT Aepb Generic drug: Fluticasone-Umeclidin-Vilant Inhale 1 puff into the lungs daily.   Vitamin D3 25 MCG (1000 UT) Caps Take 1 capsule (1,000 Units total) by mouth daily.         Follow-up Information     Anson Oregon, PA-C Follow up in 14 day(s).   Specialty: Physician Assistant Why: Ladell Heads. Contact information: [address] [place] [address] [phone]                 Allergies  Allergen Reactions   Sulfa Antibiotics Anaphylaxis   Sulfonamide Derivatives Anaphylaxis   Topamax [Topiramate] Other (See Comments)    Metabolic acidosis    Clarithromycin Other (See Comments)    pericarditis  Other reaction(s): Not available   Hydrocodone Other (See Comments)    "hyper and climbing the walls"   Motrin [Ibuprofen] Other (See Comments)    headaches   Misc. Sulfonamide Containing Compounds     Other reaction(s): Not available   Symbicort  [Budesonide-Formoterol Fumarate] Other (See Comments)    02/07/15 tremor   Tetracycline Hcl Other (See Comments)   Percocet [Oxycodone-Acetaminophen] Itching and Rash   Tape Itching and Rash    Use paper tape only   Tetracyclines & Related Other (See Comments)    "immediate yeast infection"     Subjective: pt reports feeling ok this morning, no concerns, bit more energy, eating/drinkning ok, off O2   Discharge Exam: BP 123/68 (BP Location: Right Arm)   Pulse 63   Temp 98.2 F (36.8 C)   Resp 15   Ht 5\' 2"  (1.575 m)   Wt 57 kg   SpO2  94%   BMI 22.98 kg/m  General: Pt is alert, awake, not in acute distress, Frail/thin  Cardiovascular: RRR, S1/S2 +, no rubs, no gallops Respiratory: CTA bilaterally, no wheezing, no rhonchi Abdominal: Soft, NT, ND, bowel sounds + Extremities: no edema, no cyanosis lower extremities.      The results of significant diagnostics from this hospitalization (including imaging, microbiology, ancillary and laboratory) are listed below for reference.     Microbiology: Recent Results (from the past 240 hours)  Resp panel by RT-PCR (RSV, Flu A&B, Covid) Anterior Nasal Swab     Status: None   Collection Time: 02/09/24  9:00 PM   Specimen: Anterior Nasal Swab  Result Value Ref Range Status   SARS Coronavirus 2 by RT PCR NEGATIVE NEGATIVE Final    Comment: (NOTE) SARS-CoV-2 target nucleic acids are NOT DETECTED.  The SARS-CoV-2 RNA is generally detectable in upper respiratory specimens during the acute phase of infection. The lowest concentration of SARS-CoV-2 viral copies this assay can detect is 138 copies/mL. A negative result does not preclude SARS-Cov-2 infection and should not be used as the sole basis for treatment or other patient management decisions. A negative result may occur with  improper specimen collection/handling, submission of specimen other than nasopharyngeal swab, presence of viral mutation(s) within the areas targeted by this  assay, and inadequate number of viral copies(<138 copies/mL). A negative result must be combined with clinical observations, patient history, and epidemiological information. The expected result is Negative.  Fact Sheet for Patients:  BloggerCourse.com  Fact Sheet for Healthcare Providers:  SeriousBroker.it  This test is no t yet approved or cleared by the [place] FDA and  has been authorized for detection and/or diagnosis of SARS-CoV-2 by FDA under an Emergency Use Authorization (EUA). This EUA will remain  in effect (meaning this test can be used) for the duration of the COVID-19 declaration under Section 564(b)(1) of the Act, 21 U.S.C.section 360bbb-3(b)(1), unless the authorization is terminated  or revoked sooner.       Influenza A by PCR NEGATIVE NEGATIVE Final   Influenza B by PCR NEGATIVE NEGATIVE Final    Comment: (NOTE) The Xpert Xpress SARS-CoV-2/FLU/RSV plus assay is intended as an aid in the diagnosis of influenza from Nasopharyngeal swab specimens and should not be used as a sole basis for treatment. Nasal washings and aspirates are unacceptable for Xpert Xpress SARS-CoV-2/FLU/RSV testing.  Fact Sheet for Patients: BloggerCourse.com  Fact Sheet for Healthcare Providers: SeriousBroker.it  This test is not yet approved or cleared by the [place] FDA and has been authorized for detection and/or diagnosis of SARS-CoV-2 by FDA under an Emergency Use Authorization (EUA). This EUA will remain in effect (meaning this test can be used) for the duration of the COVID-19 declaration under Section 564(b)(1) of the Act, 21 U.S.C. section 360bbb-3(b)(1), unless the authorization is terminated or revoked.     Resp Syncytial Virus by PCR NEGATIVE NEGATIVE Final    Comment: (NOTE) Fact Sheet for Patients: BloggerCourse.com  Fact Sheet for  Healthcare Providers: SeriousBroker.it  This test is not yet approved or cleared by the [place] FDA and has been authorized for detection and/or diagnosis of SARS-CoV-2 by FDA under an Emergency Use Authorization (EUA). This EUA will remain in effect (meaning this test can be used) for the duration of the COVID-19 declaration under Section 564(b)(1) of the Act, 21 U.S.C. section 360bbb-3(b)(1), unless the authorization is terminated or revoked.  Performed at [facility], [address] [place],  [place] [address]   Surgical PCR screen     Status: None   Collection Time: 02/10/24 10:21 AM   Specimen: Nasal Mucosa; Nasal Swab  Result Value Ref Range Status   MRSA, PCR NEGATIVE NEGATIVE Final   Staphylococcus aureus NEGATIVE NEGATIVE Final    Comment: (NOTE) The Xpert SA Assay (FDA approved for NASAL specimens in patients 85 years of age and older), is one component of a comprehensive surveillance program. It is not intended to diagnose infection nor to guide or monitor treatment. Performed at [facility], [address] [place], [place] [address]      Labs: BNP (last 3 results) Recent Labs    04/01/23 0816 09/04/23 1057 02/09/24 2100  BNP 681.9* 1,724.3* 467.8*   Basic Metabolic Panel: Recent Labs  Lab 02/09/24 1654 02/10/24 0855 02/11/24 0354 02/12/24 0150  NA 137 141 136 135  K 3.9 4.3 4.2 4.2  CL 98 102 98 100  CO2 28 27 29 31   GLUCOSE 107* 119* 143* 105*  BUN 19 22 31* 40*  CREATININE 1.06* 0.81 1.14* 1.00  CALCIUM 9.7 9.1 9.0 8.7*   Liver Function Tests: No results for input(s): "AST", "ALT", "ALKPHOS", "BILITOT", "PROT", "ALBUMIN" in the last 168 hours. No results for input(s): "LIPASE", "AMYLASE" in the last 168 hours. No results for input(s): "AMMONIA" in the last 168 hours. CBC: Recent Labs  Lab 02/09/24 1654 02/10/24 0855 02/11/24 0354 02/11/24 1335 02/12/24 0150 02/13/24 0550  WBC 6.5  11.1* 9.4  --  10.2 9.0  NEUTROABS 4.3  --   --   --   --   --   HGB 12.9 12.6 9.3* 9.3* 8.1* 8.4*  HCT 39.8 39.2 28.7*  --  25.1* 25.7*  MCV 99.5 99.2 99.3  --  100.8* 99.6  PLT 158 138* 109*  --  98* 114*   Cardiac Enzymes: No results for input(s): "CKTOTAL", "CKMB", "CKMBINDEX", "TROPONINI" in the last 168 hours. BNP: Invalid input(s): "POCBNP" CBG: Recent Labs  Lab 02/12/24 1119  GLUCAP 102*   D-Dimer No results for input(s): "DDIMER" in the last 72 hours. Hgb A1c No results for input(s): "HGBA1C" in the last 72 hours. Lipid Profile No results for input(s): "CHOL", "HDL", "LDLCALC", "TRIG", "CHOLHDL", "LDLDIRECT" in the last 72 hours. Thyroid function studies No results for input(s): "TSH", "T4TOTAL", "T3FREE", "THYROIDAB" in the last 72 hours.  Invalid input(s): "FREET3" Anemia work up No results for input(s): "VITAMINB12", "FOLATE", "FERRITIN", "TIBC", "IRON", "RETICCTPCT" in the last 72 hours. Urinalysis    Component Value Date/Time   COLORURINE STRAW (A) 09/04/2023 1249   APPEARANCEUR CLEAR (A) 09/04/2023 1249   LABSPEC 1.008 09/04/2023 1249   PHURINE 5.0 09/04/2023 1249   GLUCOSEU NEGATIVE 09/04/2023 1249   HGBUR NEGATIVE 09/04/2023 1249   BILIRUBINUR NEGATIVE 09/04/2023 1249   BILIRUBINUR Negative 09/02/2023 1244   KETONESUR NEGATIVE 09/04/2023 1249   PROTEINUR NEGATIVE 09/04/2023 1249   UROBILINOGEN 0.2 09/02/2023 1244   NITRITE NEGATIVE 09/04/2023 1249   LEUKOCYTESUR NEGATIVE 09/04/2023 1249   Sepsis Labs Recent Labs  Lab 02/10/24 0855 02/11/24 0354 02/12/24 0150 02/13/24 0550  WBC 11.1* 9.4 10.2 9.0   Microbiology Recent Results (from the past 240 hours)  Resp panel by RT-PCR (RSV, Flu A&B, Covid) Anterior Nasal Swab     Status: None   Collection Time: 02/09/24  9:00 PM   Specimen: Anterior Nasal Swab  Result Value Ref Range Status   SARS Coronavirus 2 by RT PCR NEGATIVE NEGATIVE Final  Comment: (NOTE) SARS-CoV-2 target nucleic acids are  NOT DETECTED.  The SARS-CoV-2 RNA is generally detectable in upper respiratory specimens during the acute phase of infection. The lowest concentration of SARS-CoV-2 viral copies this assay can detect is 138 copies/mL. A negative result does not preclude SARS-Cov-2 infection and should not be used as the sole basis for treatment or other patient management decisions. A negative result may occur with  improper specimen collection/handling, submission of specimen other than nasopharyngeal swab, presence of viral mutation(s) within the areas targeted by this assay, and inadequate number of viral copies(<138 copies/mL). A negative result must be combined with clinical observations, patient history, and epidemiological information. The expected result is Negative.  Fact Sheet for Patients:  BloggerCourse.com  Fact Sheet for Healthcare Providers:  SeriousBroker.it  This test is no t yet approved or cleared by the [place] FDA and  has been authorized for detection and/or diagnosis of SARS-CoV-2 by FDA under an Emergency Use Authorization (EUA). This EUA will remain  in effect (meaning this test can be used) for the duration of the COVID-19 declaration under Section 564(b)(1) of the Act, 21 U.S.C.section 360bbb-3(b)(1), unless the authorization is terminated  or revoked sooner.       Influenza A by PCR NEGATIVE NEGATIVE Final   Influenza B by PCR NEGATIVE NEGATIVE Final    Comment: (NOTE) The Xpert Xpress SARS-CoV-2/FLU/RSV plus assay is intended as an aid in the diagnosis of influenza from Nasopharyngeal swab specimens and should not be used as a sole basis for treatment. Nasal washings and aspirates are unacceptable for Xpert Xpress SARS-CoV-2/FLU/RSV testing.  Fact Sheet for Patients: BloggerCourse.com  Fact Sheet for Healthcare Providers: SeriousBroker.it  This test is not  yet approved or cleared by the [place] FDA and has been authorized for detection and/or diagnosis of SARS-CoV-2 by FDA under an Emergency Use Authorization (EUA). This EUA will remain in effect (meaning this test can be used) for the duration of the COVID-19 declaration under Section 564(b)(1) of the Act, 21 U.S.C. section 360bbb-3(b)(1), unless the authorization is terminated or revoked.     Resp Syncytial Virus by PCR NEGATIVE NEGATIVE Final    Comment: (NOTE) Fact Sheet for Patients: BloggerCourse.com  Fact Sheet for Healthcare Providers: SeriousBroker.it  This test is not yet approved or cleared by the [place] FDA and has been authorized for detection and/or diagnosis of SARS-CoV-2 by FDA under an Emergency Use Authorization (EUA). This EUA will remain in effect (meaning this test can be used) for the duration of the COVID-19 declaration under Section 564(b)(1) of the Act, 21 U.S.C. section 360bbb-3(b)(1), unless the authorization is terminated or revoked.  Performed at [facility], [address], [place], [place] [address]   Surgical PCR screen     Status: None   Collection Time: 02/10/24 10:21 AM   Specimen: Nasal Mucosa; Nasal Swab  Result Value Ref Range Status   MRSA, PCR NEGATIVE NEGATIVE Final   Staphylococcus aureus NEGATIVE NEGATIVE Final    Comment: (NOTE) The Xpert SA Assay (FDA approved for NASAL specimens in patients 76 years of age and older), is one component of a comprehensive surveillance program. It is not intended to diagnose infection nor to guide or monitor treatment. Performed at [facility], [address] [place], [place] [address]    Imaging DG HIP UNILAT WITH PELVIS 2-3 VIEWS RIGHT Result Date: 02/10/2024 CLINICAL DATA:  Intraoperative fluoroscopy 4 dynamic hip screw, ORIF EXAM: Intraoperative fluoroscopy COMPARISON:  Preop x-ray 02/09/2024.  FINDINGS: Four  fluoroscopic spot images submitted for review demonstrate placement dynamic hip screw transfixing the proximal intratrochanteric fracture. Long intramedullary rod. Imaging was obtained to aid in treatment. Expected alignment. Please correlate with real-time fluoroscopy of 1 minute and 13 seconds. Cumulative dose 10.09 mGy IMPRESSION: Intraoperative fluoroscopy Electronically Signed   By: Karen Kays M.D.   On: 02/10/2024 15:38   DG C-Arm 1-60 Min-No Report Result Date: 02/10/2024 Fluoroscopy was utilized by the requesting physician.  No radiographic interpretation.   DG C-Arm 1-60 Min-No Report Result Date: 02/10/2024 Fluoroscopy was utilized by the requesting physician.  No radiographic interpretation.   ECHOCARDIOGRAM COMPLETE Result Date: 02/10/2024    ECHOCARDIOGRAM REPORT   Patient Name:   Crystal Bass Date of Exam: 02/10/2024 Medical Rec #:  [id number]         Height:       62.0 in Accession #:    [id number]        Weight:       125.7 lb Date of Birth:  1941/08/20         BSA:          1.569 m Patient Age:    82 years          BP:           119/59 mmHg Patient Gender: F                 HR:           61 bpm. Exam Location:  [facility] Procedure: 2D Echo, Cardiac Doppler and Color Doppler (Both Spectral and Color            Flow Doppler were utilized during procedure). Indications:     Mitral valve disorder I05.9  History:         Patient has prior history of Echocardiogram examinations, most                  recent 09/05/2023. COPD; Risk Factors:Hypertension. History of                  mitral valve repair.                   Mitral Valve: Mitra-Clip valve is present in the mitral                  position.  Sonographer:     Cristela Blue Referring Phys:  [id number] CADENCE H FURTH Diagnosing Phys: Lorine Bears MD IMPRESSIONS  1. Left ventricular ejection fraction, by estimation, is 70 to 75%. The left ventricle has hyperdynamic function. The left ventricle has no regional wall motion abnormalities. There is  moderate asymmetric left ventricular hypertrophy of the septal segment. Left ventricular diastolic parameters are indeterminate.  2. Right ventricular systolic function is normal. The right ventricular size is normal. There is moderately elevated pulmonary artery systolic pressure.  3. Left atrial size was moderately dilated.  4. The mitral valve has been repaired/replaced. Moderate mitral valve regurgitation. Mild mitral stenosis. The mean mitral valve gradient is 6.0 mmHg. There is a Mitra-Clip present in the mitral position.  5. The aortic valve is normal in structure. Aortic valve regurgitation is not visualized. No aortic stenosis is present.  6. Moderately dilated pulmonary artery.  7. The inferior vena cava is normal in size with greater than 50% respiratory variability, suggesting right atrial pressure of 3 mmHg. FINDINGS  Left Ventricle: Left ventricular ejection fraction, by estimation, is 70 to 75%.  The left ventricle has hyperdynamic function. The left ventricle has no regional wall motion abnormalities. The left ventricular internal cavity size was normal in size. There is moderate asymmetric left ventricular hypertrophy of the septal segment. Left ventricular diastolic parameters are indeterminate. Right Ventricle: The right ventricular size is normal. No increase in right ventricular wall thickness. Right ventricular systolic function is normal. There is moderately elevated pulmonary artery systolic pressure. The tricuspid regurgitant velocity is 3.34 m/s, and with an assumed right atrial pressure of 3 mmHg, the estimated right ventricular systolic pressure is 47.6 mmHg. Left Atrium: Left atrial size was moderately dilated. Right Atrium: Right atrial size was normal in size. Pericardium: There is no evidence of pericardial effusion. Mitral Valve: The mitral valve has been repaired/replaced. Moderate mitral valve regurgitation. There is a Mitra-Clip present in the mitral position. Mild mitral valve  stenosis. MV peak gradient, 13.1 mmHg. The mean mitral valve gradient is 6.0 mmHg. Tricuspid Valve: The tricuspid valve is normal in structure. Tricuspid valve regurgitation is mild . No evidence of tricuspid stenosis. Aortic Valve: The aortic valve is normal in structure. Aortic valve regurgitation is not visualized. No aortic stenosis is present. Aortic valve mean gradient measures 3.0 mmHg. Aortic valve peak gradient measures 5.2 mmHg. Aortic valve area, by VTI measures 4.39 cm. Pulmonic Valve: The pulmonic valve was normal in structure. Pulmonic valve regurgitation is trivial. No evidence of pulmonic stenosis. Aorta: The aortic root is normal in size and structure. Pulmonary Artery: The pulmonary artery is moderately dilated. Venous: The inferior vena cava is normal in size with greater than 50% respiratory variability, suggesting right atrial pressure of 3 mmHg. IAS/Shunts: No atrial level shunt detected by color flow Doppler.  LEFT VENTRICLE PLAX 2D LVIDd:         5.20 cm   Diastology LVIDs:         2.40 cm   LV e' medial:    4.79 cm/s LV PW:         1.10 cm   LV E/e' medial:  34.0 LV IVS:        1.70 cm   LV e' lateral:   6.64 cm/s LVOT diam:     2.55 cm   LV E/e' lateral: 24.5 LV SV:         76 LV SV Index:   48 LVOT Area:     5.11 cm  RIGHT VENTRICLE RV Basal diam:  2.50 cm RV Mid diam:    3.20 cm RV S prime:     11.30 cm/s TAPSE (M-mode): 1.6 cm LEFT ATRIUM             Index        RIGHT ATRIUM           Index LA diam:        4.40 cm 2.80 cm/m   RA Area:     13.90 cm LA Vol (A2C):   61.1 ml 38.94 ml/m  RA Volume:   36.10 ml  23.01 ml/m LA Vol (A4C):   64.8 ml 41.30 ml/m LA Biplane Vol: 64.3 ml 40.98 ml/m  AORTIC VALVE AV Area (Vmax):    3.36 cm AV Area (Vmean):   3.85 cm AV Area (VTI):     4.39 cm AV Vmax:           114.00 cm/s AV Vmean:          72.400 cm/s AV VTI:  0.172 m AV Peak Grad:      5.2 mmHg AV Mean Grad:      3.0 mmHg LVOT Vmax:         75.00 cm/s LVOT Vmean:        54.600  cm/s LVOT VTI:          0.148 m LVOT/AV VTI ratio: 0.86  AORTA Ao Root diam: 3.30 cm MITRAL VALVE                TRICUSPID VALVE MV Area (PHT): 1.65 cm     TR Peak grad:   44.6 mmHg MV Area VTI:   1.24 cm     TR Vmax:        334.00 cm/s MV Peak grad:  13.1 mmHg MV Mean grad:  6.0 mmHg     SHUNTS MV Vmax:       1.81 m/s     Systemic VTI:  0.15 m MV Vmean:      111.0 cm/s   Systemic Diam: 2.55 cm MV Decel Time: 460 msec MV E velocity: 163.00 cm/s MV A velocity: 88.60 cm/s MV E/A ratio:  1.84 Lorine Bears MD Electronically signed by Lorine Bears MD Signature Date/Time: 02/10/2024/9:07:50 AM    Final       Time coordinating discharge: over 30 minutes  SIGNED:  Sunnie Nielsen DO [facility]

## 2024-02-13 NOTE — TOC Progression Note (Addendum)
 Transition of Care [facility]) - Progression Note    Patient Details  Name: Crystal Bass MRN: [id number] Date of Birth: 1941/10/12  [facility]) CM/SW Contact  Marlowe Sax, RN Phone Number: 02/13/2024, 9:59 AM  Clinical Narrative:     Sent DC summary to [facility], awaiting a room number and number to call report will transport via EMS Called Brother Loraine Leriche and Wann to go to [facility]  Expected Discharge Plan: Skilled Nursing Facility Barriers to Discharge: SNF Pending bed offer, Insurance Authorization  Expected Discharge Plan and Services   Discharge Planning Services: CM Consult   Living arrangements for the past 2 months: Assisted Living Facility Expected Discharge Date: 02/13/24                                     Social Determinants of Health (SDOH) Interventions SDOH Screenings   Food Insecurity: No Food Insecurity (02/10/2024)  Housing: Low Risk  (02/10/2024)  Transportation Needs: No Transportation Needs (02/10/2024)  Utilities: Not At Risk (02/10/2024)  Alcohol Screen: Low Risk  (08/13/2022)  Depression (PHQ2-9): Low Risk  (12/07/2022)  Financial Resource Strain: Low Risk  (08/13/2022)  Physical Activity: Insufficiently Active (08/13/2022)  Social Connections: Moderately Isolated (02/10/2024)  Stress: No Stress Concern Present (08/13/2022)  Tobacco Use: Low Risk  (02/10/2024)    Readmission Risk Interventions     No data to display

## 2024-02-13 NOTE — TOC Progression Note (Signed)
 Transition of Care [facility]) - Progression Note    Patient Details  Name: Crystal Bass MRN: [id number] Date of Birth: [birth year]  Transition [facility]) CM/SW Contact  Marlowe Sax, RN Phone Number: 02/13/2024, 8:59 AM  Clinical Narrative:     Approved  plan Auth ID:  [id number] Dates:3/20-3/24/25  next review date:  02/17/2024  Expected Discharge Plan: Skilled Nursing Facility Barriers to Discharge: SNF Pending bed offer, Insurance Authorization  Expected Discharge Plan and Services   Discharge Planning Services: CM Consult   Living arrangements for the past 2 months: Assisted Living Facility                                       Social Determinants of Health (SDOH) Interventions SDOH Screenings   Food Insecurity: No Food Insecurity (02/10/2024)  Housing: Low Risk  (02/10/2024)  Transportation Needs: No Transportation Needs (02/10/2024)  Utilities: Not At Risk (02/10/2024)  Alcohol Screen: Low Risk  (08/13/2022)  Depression (PHQ2-9): Low Risk  (12/07/2022)  Financial Resource Strain: Low Risk  (08/13/2022)  Physical Activity: Insufficiently Active (08/13/2022)  Social Connections: Moderately Isolated (02/10/2024)  Stress: No Stress Concern Present (08/13/2022)  Tobacco Use: Low Risk  (02/10/2024)    Readmission Risk Interventions     No data to display

## 2024-02-14 ENCOUNTER — Encounter: Payer: Self-pay | Admitting: Student

## 2024-02-14 ENCOUNTER — Non-Acute Institutional Stay (SKILLED_NURSING_FACILITY): Payer: Self-pay | Admitting: Student

## 2024-02-14 DIAGNOSIS — Z9889 Other specified postprocedural states: Secondary | ICD-10-CM

## 2024-02-14 DIAGNOSIS — I340 Nonrheumatic mitral (valve) insufficiency: Secondary | ICD-10-CM

## 2024-02-14 DIAGNOSIS — Z95818 Presence of other cardiac implants and grafts: Secondary | ICD-10-CM

## 2024-02-14 DIAGNOSIS — E43 Unspecified severe protein-calorie malnutrition: Secondary | ICD-10-CM | POA: Diagnosis not present

## 2024-02-14 DIAGNOSIS — I4891 Unspecified atrial fibrillation: Secondary | ICD-10-CM | POA: Diagnosis not present

## 2024-02-14 DIAGNOSIS — J449 Chronic obstructive pulmonary disease, unspecified: Secondary | ICD-10-CM | POA: Diagnosis not present

## 2024-02-14 DIAGNOSIS — M810 Age-related osteoporosis without current pathological fracture: Secondary | ICD-10-CM | POA: Diagnosis not present

## 2024-02-14 DIAGNOSIS — R54 Age-related physical debility: Secondary | ICD-10-CM

## 2024-02-14 DIAGNOSIS — S72002D Fracture of unspecified part of neck of left femur, subsequent encounter for closed fracture with routine healing: Secondary | ICD-10-CM

## 2024-02-14 DIAGNOSIS — G20A1 Parkinson's disease without dyskinesia, without mention of fluctuations: Secondary | ICD-10-CM

## 2024-02-14 DIAGNOSIS — S52531D Colles' fracture of right radius, subsequent encounter for closed fracture with routine healing: Secondary | ICD-10-CM | POA: Diagnosis not present

## 2024-02-14 DIAGNOSIS — I10 Essential (primary) hypertension: Secondary | ICD-10-CM

## 2024-02-14 NOTE — Progress Notes (Unsigned)
 Provider:   Dr. Earnestine Bass Location:  Other [place].  Nursing Home Room Number: [facility] Place of Service:  SNF (31)  PCP: Crystal Mealing, MD Patient Care Team: Crystal Mealing, MD as PCP - General (Family Medicine) Crystal Ouch, MD as PCP - Cardiology (Cardiology) Crystal Ace, MD as Referring Physician (Allergy) Crystal Bears, MD as Consulting Physician (Gynecology) Tat, Crystal Batty, DO as Consulting Physician (Neurology) Crystal Croissant, MD as Consulting Physician (Hematology and Oncology) Tat, Crystal Batty, DO as Consulting Physician (Neurology) [facility], [facility] (Inactive) as Pharmacist (Pharmacist)  Extended Emergency Contact Information Primary Emergency Contact: Crystal Bass [place], [place] [address] [place] of [place] Home Phone: [phone] Mobile Phone: [phone] Relation: Brother Secondary Emergency Contact: [facility] Phone: [phone] Mobile Phone: [phone] Relation: Niece Preferred language: English Interpreter needed? No  Code Status: DNR Goals of Care: Advanced Directive information    02/10/2024    9:00 AM  Advanced Directives  Does Patient Have a Medical Advance Directive? Yes  Type of Advance Directive Out of facility DNR (pink MOST or yellow form)  Does patient want to make changes to medical advance directive? No - Patient declined  Pre-existing out of facility DNR order (yellow form or pink MOST form) Pink Most/Yellow Form available - Physician notified to receive inpatient order;Pink MOST/Yellow Form most recent copy in chart - Physician notified to receive inpatient order    Chief Complaint  Patient presents with  . New Admit To SNF    Admission.     HPI: Patient is a 83 y.o. female seen today for admission to [facility].   History of Present Illness She presents with weakness and difficulty with physical therapy following a recent hip fracture and surgery.  She experienced a fall  while trying to pick up a grape, resulting in a right hip fracture. She underwent surgery on February 10, 2024, where an intertrochanteric rod was placed to fix the fracture.  Post-surgery, she has been having difficulty engaging with physical therapy. She expresses a lack of consultation regarding her physical therapy plan and indicates reluctance to participate. Despite this, she acknowledges a goal of improving mobility, stating 'I'll do everything I can.'  She reports significant weakness, particularly in the morning, which has required assistance with eating breakfast. She has difficulty feeding herself due to an inability to lift her arm to her mouth, which she attributes to landing on her arm and wrist during the fall. She notes that she was not feeding herself in the [facility] and required assistance from nursing staff today.  Regarding her medication, she mentions a change from Allegra to Benadryl for allergy control, although she has previously tried Zyrtec. She is aware of the potential side effects of Benadryl, including confusion and urinary retention.  She has consistently expressed a preference not to return to the [facility] if her health deteriorates, preferring to be made comfortable instead. She has been under care for the past six months and has maintained this stance throughout.  Past Medical History - Right hip fracture (closed reduction and internal fixation with interchanteric rod) - Allergies  Medications - Sinemet - Benadryl  Results RADIOLOGY Right hip X-ray: Closed right hip fracture (02/09/2024)  DIAGNOSTIC Echocardiogram: Cleared for anesthesia (02/09/2024)  Assessment and Plan  Past Medical History:  Diagnosis Date  . 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 Anginal pain (HCC)  a. 10/2021 Cath: Nl cors.  . Arthritis   . Asthma   . Breast cancer (HCC) 2002   infiltrative ductal carcinoma   . Cataract 2019  . Chronic heart  failure with preserved ejection fraction (HFpEF) (HCC)   . COPD (chronic obstructive pulmonary disease) (HCC) 07/2007  . Coronary artery disease   . Ductal carcinoma of breast, estrogen receptor positive, stage 1 (HCC) 09/16/2012  . Endometriosis   . GERD (gastroesophageal reflux disease)   . Hx of mitral valve repair   . Hypercalcemia 09/14/2013  . Hypertension   . Lesion of breast 1992   right, benign  . Lung disease    secondary to [name] infection  . Mastalgia 05/1994  . Mitral regurgitation    a. 10/2021 RHC: PCWP V wave of 34; b. 01/2022 TEE: Severe flail/prolapse of large area of A2 scallop with severe posteriorly directed MR; c. 01/2022 TEER w /Mitraclip XTW x 2; d. 07/2022 Echo: EF 60-65%. No rwma, mild LVH, GrIII DD. Sev dil LA. Mod elev PASP. Mod-Sev MR. Mod MS.  . Moderate mitral regurgitation by prior echocardiogram   . Osteoporosis, post-menopausal 03/14/2012  . Ovarian tumor of borderline malignancy, right 2004  . Palpitations    a. 02/2022 Zio: Predominantly sinus rhythm at 67 (52-171).  2 runs of NSVT -fastest 9 beats at 167.  35 SVT runs-longest 19 beats, fastest 171x6 beats.  . Pancreatitis    secondary to Cholelithiasis  . Parkinson disease (HCC) 02/10/2015  . Post-thoracotomy pain syndrome   . Pseudomonas pneumonia (HCC) 03/2008  . Status post implantation of mitral valve leaflet clip 02/15/2022   s/p XTW MitraClip x2 by Dr. Excell Seltzer in the A2/P2: b. 04/03/23 s/p third mitraclip implantation.  . Traumatic closed fracture of distal clavicle with minimal displacement, left, initial encounter 06/2021   [facility]  . Uterine fibroid   . Vitamin D deficiency    Past Surgical History:  Procedure Laterality Date  . 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 APPENDECTOMY  2004  . BREAST BIOPSY  08/2004   right breast-benign  . BREAST LUMPECTOMY  1992   benign  . BUBBLE STUDY  12/27/2021   Procedure: BUBBLE STUDY;  Surgeon: Crystal Rives, MD;  [facility] ENDOSCOPY;  Service: Cardiovascular;;  . CARDIAC CATHETERIZATION  2007   essentially negation for significant CAD  . CARDIOVERSION N/A 09/05/2023   Procedure: CARDIOVERSION;  Surgeon: Debbe Odea, MD;  Location: [facility] ORS;  Service: Cardiovascular;  Laterality: N/A;  . CATARACT EXTRACTION W/PHACO Left 07/24/2023   Procedure: CATARACT EXTRACTION PHACO AND INTRAOCULAR LENS PLACEMENT (IOC) LEFT 5.34 0037.3;  Surgeon: Lockie Mola, MD;  Location: [facility];  Service: Ophthalmology;  Laterality: Left;  . CATARACT EXTRACTION W/PHACO Right 08/07/2023   Procedure: CATARACT EXTRACTION PHACO AND INTRAOCULAR LENS PLACEMENT (IOC) RIGHT 7.83 00:47.9;  Surgeon: Lockie Mola, MD;  Location: [facility];  Service: Ophthalmology;  Laterality: Right;  . CHOLECYSTECTOMY  2001  . COLONOSCOPY  2014   Dr Marina Goodell , due 2019  . COLONOSCOPY  09/2018   2 TA removed, int hem, no f/u needed (Dr Marina Goodell)   . ESOPHAGOGASTRODUODENOSCOPY  09/2018   GERD with esophagitis and stricture dilated, antral gastritis negative for H pylori Marina Goodell)  . HIP ARTHROPLASTY Left 08/21/2022   Procedure: ARTHROPLASTY BIPOLAR HIP (HEMIARTHROPLASTY);  Surgeon: Juanell Fairly, MD;  Location: [facility] ORS;  Service: Orthopedics;  Laterality: Left;  . INTRAMEDULLARY (IM) NAIL INTERTROCHANTERIC  Right 02/10/2024   Procedure: FIXATION, FRACTURE, INTERTROCHANTERIC, WITH INTRAMEDULLARY ROD;  Surgeon: Christena Flake, MD;  Location: [facility] ORS;  Service: Orthopedics;  Laterality: Right;  . LUNG BIOPSY  2002   [name], Dr Edwyna Shell  . MASTECTOMY MODIFIED RADICAL Left 2002   oral chemotheraphy (tamoxifen then Armidex) no radiation, Dr.[name]  . RIGHT/LEFT HEART CATH AND CORONARY ANGIOGRAPHY Bilateral 11/14/2021   Procedure: RIGHT/LEFT HEART CATH AND CORONARY ANGIOGRAPHY;  Surgeon: Crystal Ouch, MD;  Location: [facility] INVASIVE CV LAB;  Service: Cardiovascular;  Laterality: Bilateral;  . TEE  WITHOUT CARDIOVERSION N/A 12/29/2018   Procedure: TRANSESOPHAGEAL ECHOCARDIOGRAM (TEE);  Surgeon: Crystal Ouch, MD;  Location: [facility] ORS;  Service: Cardiovascular;  Laterality: N/A;  . TEE WITHOUT CARDIOVERSION N/A 12/27/2021   Procedure: TRANSESOPHAGEAL ECHOCARDIOGRAM (TEE);  Surgeon: Crystal Rives, MD;  Location: [facility];  Service: Cardiovascular;  Laterality: N/A;  . TEE WITHOUT CARDIOVERSION N/A 02/15/2022   Procedure: TRANSESOPHAGEAL ECHOCARDIOGRAM (TEE);  Surgeon: Tonny Bollman, MD;  Location: [facility] INVASIVE CV LAB;  Service: Cardiovascular;  Laterality: N/A;  . TEE WITHOUT CARDIOVERSION N/A 05/14/2022   Procedure: TRANSESOPHAGEAL ECHOCARDIOGRAM (TEE);  Surgeon: Crystal Rives, MD;  Location: [facility];  Service: Cardiovascular;  Laterality: N/A;  . TEE WITHOUT CARDIOVERSION N/A 04/03/2023   Procedure: TRANSESOPHAGEAL ECHOCARDIOGRAM;  Surgeon: Tonny Bollman, MD;  Location: [facility] INVASIVE CV LAB;  Service: Cardiovascular;  Laterality: N/A;  . TEE WITHOUT CARDIOVERSION N/A 09/05/2023   Procedure: TRANSESOPHAGEAL ECHOCARDIOGRAM;  Surgeon: Debbe Odea, MD;  Location: [facility] ORS;  Service: Cardiovascular;  Laterality: N/A;  . TONSILLECTOMY  1946  . TRANSCATHETER MITRAL EDGE TO EDGE REPAIR N/A 02/15/2022   Procedure: MITRAL VALVE REPAIR;  Surgeon: Tonny Bollman, MD;  Location: [facility] INVASIVE CV LAB;  Service: Cardiovascular;  Laterality: N/A;  . TRANSCATHETER MITRAL EDGE TO EDGE REPAIR N/A 04/03/2023   Procedure: MITRAL VALVE REPAIR;  Surgeon: Tonny Bollman, MD;  Location: [facility] INVASIVE CV LAB;  Service: Cardiovascular;  Laterality: N/A;    reports that she has never smoked. She has never used smokeless tobacco. She reports that she does not drink alcohol and does not use drugs. Social History   Socioeconomic History  . Marital status: Widowed    Spouse name: Not on file  . Number of children: 0  . Years of education: Not on file  . Highest education level: Not on file   Occupational History  . 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 Smoking status: Never  . Smokeless tobacco: Never  Vaping Use  . Vaping status: Never Used  Substance and Sexual Activity  . Alcohol use: No    Alcohol/week: 0.0 standard drinks of alcohol  . Drug use: No  . Sexual activity: Not Currently    Birth control/protection: Surgical    Comment: TAH/BSO  Other Topics Concern  . 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 Difficulty of Paying Living Expenses: Not hard at all  Food Insecurity: No Food Insecurity (02/10/2024)  Hunger Vital Sign   . Worried About Programme researcher, broadcasting/film/video in the Last Year: Never true   . Ran Out of Food in the Last Year: Never true  Transportation Needs: No Transportation Needs (02/10/2024)   PRAPARE - Transportation   . Lack of Transportation (Medical): No   . Lack of Transportation (Non-Medical): No  Physical Activity: Insufficiently Active (08/13/2022)   Exercise Vital Sign   . Days of Exercise per Week: 4 days   . Minutes of Exercise per Session: 30 min  Stress: No Stress Concern Present (08/13/2022)   [employer] of Occupational Health - Occupational Stress Questionnaire   . Feeling of Stress : Not at all  Social Connections: Moderately Isolated (02/10/2024)   Social Connection and Isolation Panel [NHANES]   . Frequency of Communication with Friends and Family: More than three times a week   . Frequency of Social Gatherings with Friends and Family: Once a week   . Attends Religious  Services: 1 to 4 times per year   . Active Member of Clubs or Organizations: No   . Attends Banker Meetings: Never   . Marital Status: Widowed  Intimate Partner Violence: Not At Risk (02/10/2024)   Humiliation, Afraid, Rape, and Kick questionnaire   . Fear of Current or Ex-Partner: No   . Emotionally Abused: No   . Physically Abused: No   . Sexually Abused: No    Functional Status Survey:    Family History  Problem Relation Age of Onset  . Emphysema Mother        d.64 was never a smoker  . Lung cancer Father 82       d.82 history of smoking  . Breast cancer Sister 75  . Colon cancer Sister   . Melanoma Maternal Aunt   . Liver cancer Maternal Aunt   . Cancer Maternal Uncle        unspecified type  . Cancer Maternal Uncle        unspecified type  . Cancer Maternal Uncle        unspecified type  . Stroke Paternal Aunt        in mid 50s  . Osteoporosis Paternal Aunt   . Myasthenia gravis Paternal Aunt   . Cancer Maternal Grandmother        d.82s unspecified GI cancer  . Cancer Maternal Grandfather        d.62s unspecified type  . Breast cancer Cousin        d.60s-daughter of unaffected paternal aunt Doristine Church  . Colon cancer Cousin 60       d.70-daughter of unaffected maternal aunt Leila  . Lung cancer Cousin 70       d.70-sisters to each other, both daughters of maternal uncle Johnny  . Parkinson's disease Cousin   . Diabetes Neg Hx   . Heart disease Neg Hx   . Stomach cancer Neg Hx   . Ulcerative colitis Neg Hx     Health Maintenance  Topic Date Due  . COVID-19 Vaccine (9 - 2024-25 season) 03/01/2024 (Originally 07/28/2023)  . Medicare Annual Wellness (AWV)  09/23/2024  . DTaP/Tdap/Td (3 - Tdap) 07/01/2031  . Pneumonia Vaccine 46+ Years old  Completed  . INFLUENZA VACCINE  Completed  . DEXA SCAN  Completed  . Zoster Vaccines- Shingrix  Completed  . HPV VACCINES  Aged Out  . Colonoscopy  Discontinued    Allergies  Allergen Reactions  . Sulfa  Antibiotics Anaphylaxis  . Sulfonamide  Derivatives Anaphylaxis  . Topamax [Topiramate] Other (See Comments)    Metabolic acidosis   . Clarithromycin Other (See Comments)    pericarditis  Other reaction(s): Not available  . Hydrocodone Other (See Comments)    "hyper and climbing the walls"  . Motrin [Ibuprofen] Other (See Comments)    headaches  . Misc. Sulfonamide Containing Compounds     Other reaction(s): Not available  . Symbicort [Budesonide-Formoterol Fumarate] Other (See Comments)    02/07/15 tremor  . Tetracycline Hcl Other (See Comments)  . Percocet [Oxycodone-Acetaminophen] Itching and Rash  . Tape Itching and Rash    Use paper tape only  . Tetracyclines & Related Other (See Comments)    "immediate yeast infection"    Outpatient Encounter Medications as of 02/14/2024  Medication Sig  . acetaminophen (TYLENOL) 500 MG tablet Take 1,000 mg by mouth 3 (three) times daily.  [name] albuterol (VENTOLIN HFA) 108 (90 Base) MCG/ACT inhaler Inhale 2 puffs into the lungs every 6 (six) hours as needed for wheezing or shortness of breath.  [name] amiodarone (PACERONE) 100 MG tablet Take 100 mg by mouth daily.  [name] apixaban (ELIQUIS) 2.5 MG TABS tablet Take 1 tablet (2.5 mg total) by mouth 2 (two) times daily.  . bisacodyl (DULCOLAX) 10 MG suppository Place 10 mg rectally as needed for moderate constipation.  . calcium carbonate (OSCAL) 1500 (600 Ca) MG TABS tablet Take 600 mg of elemental calcium by mouth daily.  . calcium carbonate (TUMS - DOSED IN MG ELEMENTAL CALCIUM) 500 MG chewable tablet Chew 2 tablets by mouth daily as needed for indigestion or heartburn.  . Carbidopa-Levodopa ER (SINEMET CR) 25-100 MG tablet controlled release Take 2 tablets by mouth 2 (two) times daily.  . Carbidopa-Levodopa ER (SINEMET CR) 25-100 MG tablet controlled release Take 1 tablet by mouth daily before supper.  . Cholecalciferol (VITAMIN D3) 25 MCG (1000 UT) CAPS Take 1 capsule (1,000 Units total) by mouth daily.   [name] docusate sodium (COLACE) 100 MG capsule Take 100 mg by mouth 2 (two) times daily.  [name] EPINEPHrine 0.3 mg/0.3 mL IJ SOAJ injection Inject 0.3 mg into the muscle as needed for anaphylaxis.  . fluticasone (FLONASE) 50 MCG/ACT nasal spray Place 2 sprays into both nostrils daily as needed.  . Fluticasone-Umeclidin-Vilant (TRELEGY ELLIPTA) 100-62.5-25 MCG/ACT AEPB Inhale 1 puff into the lungs daily.  . furosemide (LASIX) 40 MG tablet TAKE ONE TABLET BY MOUTH ONCE DAILY *NEW DOSE* (Patient taking differently: Take 40 mg by mouth 2 (two) times daily.)  . HYDROcodone-acetaminophen (NORCO/VICODIN) 5-325 MG tablet Take 1-2 tablets by mouth every 6 (six) hours as needed for moderate pain (pain score 4-6) or severe pain (pain score 7-10).  . methocarbamol (ROBAXIN) 500 MG tablet Take 1 tablet (500 mg total) by mouth every 8 (eight) hours as needed for muscle spasms.  . Multiple Vitamin (MULTIVITAMIN WITH MINERALS) TABS tablet Take 1 tablet by mouth daily.  . nitroGLYCERIN (NITROSTAT) 0.4 MG SL tablet Place 0.4 mg under the tongue every 5 (five) minutes as needed for chest pain.  . pantoprazole (PROTONIX) 20 MG tablet Take 1 tablet (20 mg total) by mouth daily.  . polyethylene glycol (MIRALAX / GLYCOLAX) 17 g packet Take 17 g by mouth daily as needed.  . diphenhydrAMINE (BENADRYL) 12.5 MG/5ML elixir Take 5-10 mLs (12.5-25 mg total) by mouth every 4 (four) hours as needed for itching. (Patient not taking: Reported on 02/14/2024)  . magnesium hydroxide (MILK OF MAGNESIA) 400 MG/5ML suspension Take 30 mLs by mouth  daily as needed for mild constipation. (Patient not taking: Reported on 02/14/2024)  . ondansetron (ZOFRAN) 4 MG tablet Take 1 tablet (4 mg total) by mouth every 6 (six) hours as needed for nausea. (Patient not taking: Reported on 02/14/2024)  . sodium phosphate (FLEET) ENEM Place 133 mLs (1 enema total) rectally once as needed for severe constipation. (Patient not taking: Reported on 02/14/2024)   No  facility-administered encounter medications on file as of 02/14/2024.    Review of Systems  Vitals:   02/14/24 0828  BP: (!) 100/57  Pulse: (!) 57  Resp: 18  Temp: 98.9 F (37.2 C)  SpO2: 93%  Weight: 122 lb 6.4 oz (55.5 kg)  Height: 5\' 2"  (1.575 m)   Body mass index is 22.39 kg/m. Physical Exam  Labs reviewed: Basic Metabolic Panel: Recent Labs    09/04/23 1057 09/05/23 0521 02/10/24 0855 02/11/24 0354 02/12/24 0150  NA  --    < > 141 136 135  K  --    < > 4.3 4.2 4.2  CL  --    < > 102 98 100  CO2  --    < > 27 29 31   GLUCOSE  --    < > 119* 143* 105*  BUN  --    < > 22 31* 40*  CREATININE  --    < > 0.81 1.14* 1.00  CALCIUM  --    < > 9.1 9.0 8.7*  MG 2.1  --   --   --   --    < > = values in this interval not displayed.   Liver Function Tests: Recent Labs    04/01/23 0816 08/05/23 1114 09/05/23 0521  AST 20 13 21   ALT 5 2 14   ALKPHOS 67 98 80  BILITOT 2.0* 1.0 1.5*  PROT 6.9 7.6 6.7  ALBUMIN 3.8 3.7 3.4*   No results for input(s): "LIPASE", "AMYLASE" in the last 8760 hours. No results for input(s): "AMMONIA" in the last 8760 hours. CBC: Recent Labs    09/07/23 0313 09/08/23 0417 09/26/23 0000 02/09/24 1654 02/10/24 0855 02/11/24 0354 02/11/24 1335 02/12/24 0150 02/13/24 0550  WBC 6.2 6.0   < > 6.5   < > 9.4  --  10.2 9.0  NEUTROABS 4.8 4.3  --  4.3  --   --   --   --   --   HGB 11.8* 12.7   < > 12.9   < > 9.3* 9.3* 8.1* 8.4*  HCT 36.8 38.9   < > 39.8   < > 28.7*  --  25.1* 25.7*  MCV 100.0 99.0  --  99.5   < > 99.3  --  100.8* 99.6  PLT 140* 162   < > 158   < > 109*  --  98* 114*   < > = values in this interval not displayed.   Cardiac Enzymes: No results for input(s): "CKTOTAL", "CKMB", "CKMBINDEX", "TROPONINI" in the last 8760 hours. BNP: Invalid input(s): "POCBNP" Lab Results  Component Value Date   HGBA1C 5.3 01/14/2018   Lab Results  Component Value Date   TSH 1.145 09/07/2023   Lab Results  Component Value Date    VITAMINB12 350 09/25/2022   No results found for: "FOLATE" No results found for: "IRON", "TIBC", "FERRITIN"  Imaging and Procedures obtained prior to SNF admission: DG HIP UNILAT WITH PELVIS 2-3 VIEWS RIGHT Result Date: 02/10/2024 CLINICAL DATA:  Intraoperative fluoroscopy 4 dynamic hip screw, ORIF EXAM:  Intraoperative fluoroscopy COMPARISON:  Preop x-ray 02/09/2024. FINDINGS: Four fluoroscopic spot images submitted for review demonstrate placement dynamic hip screw transfixing the proximal intratrochanteric fracture. Long intramedullary rod. Imaging was obtained to aid in treatment. Expected alignment. Please correlate with real-time fluoroscopy of 1 minute and 13 seconds. Cumulative dose 10.09 mGy IMPRESSION: Intraoperative fluoroscopy Electronically Signed   By: Karen Kays M.D.   On: 02/10/2024 15:38   DG C-Arm 1-60 Min-No Report Result Date: 02/10/2024 Fluoroscopy was utilized by the requesting physician.  No radiographic interpretation.   DG C-Arm 1-60 Min-No Report Result Date: 02/10/2024 Fluoroscopy was utilized by the requesting physician.  No radiographic interpretation.   ECHOCARDIOGRAM COMPLETE Result Date: 02/10/2024    ECHOCARDIOGRAM REPORT   Patient Name:   [facility] Date of Exam: 02/10/2024 Medical Rec #:  [id number]         Height:       62.0 in Accession #:    [id number]        Weight:       125.7 lb Date of Birth:  [birth year]         BSA:          1.569 m Patient Age:    82 years          BP:           119/59 mmHg Patient Gender: F                 HR:           61 bpm. Exam Location:  [facility] Procedure: 2D Echo, Cardiac Doppler and Color Doppler (Both Spectral and Color            Flow Doppler were utilized during procedure). Indications:     Mitral valve disorder I05.9  History:         Patient has prior history of Echocardiogram examinations, most                  recent 09/05/2023. COPD; Risk Factors:Hypertension. History of                  mitral valve repair.                    Mitral Valve: Mitra-Clip valve is present in the mitral                  position.  Sonographer:     Cristela Blue Referring Phys:  [id number] CADENCE H FURTH Diagnosing Phys: Lorine Bears MD IMPRESSIONS  1. Left ventricular ejection fraction, by estimation, is 70 to 75%. The left ventricle has hyperdynamic function. The left ventricle has no regional wall motion abnormalities. There is moderate asymmetric left ventricular hypertrophy of the septal segment. Left ventricular diastolic parameters are indeterminate.  2. Right ventricular systolic function is normal. The right ventricular size is normal. There is moderately elevated pulmonary artery systolic pressure.  3. Left atrial size was moderately dilated.  4. The mitral valve has been repaired/replaced. Moderate mitral valve regurgitation. Mild mitral stenosis. The mean mitral valve gradient is 6.0 mmHg. There is a Mitra-Clip present in the mitral position.  5. The aortic valve is normal in structure. Aortic valve regurgitation is not visualized. No aortic stenosis is present.  6. Moderately dilated pulmonary artery.  7. The inferior vena cava is normal in size with greater than 50% respiratory variability, suggesting right atrial pressure of 3 mmHg. FINDINGS  Left Ventricle: Left ventricular ejection fraction,  by estimation, is 70 to 75%. The left ventricle has hyperdynamic function. The left ventricle has no regional wall motion abnormalities. The left ventricular internal cavity size was normal in size. There is moderate asymmetric left ventricular hypertrophy of the septal segment. Left ventricular diastolic parameters are indeterminate. Right Ventricle: The right ventricular size is normal. No increase in right ventricular wall thickness. Right ventricular systolic function is normal. There is moderately elevated pulmonary artery systolic pressure. The tricuspid regurgitant velocity is 3.34 m/s, and with an assumed right atrial pressure of 3 mmHg, the  estimated right ventricular systolic pressure is 47.6 mmHg. Left Atrium: Left atrial size was moderately dilated. Right Atrium: Right atrial size was normal in size. Pericardium: There is no evidence of pericardial effusion. Mitral Valve: The mitral valve has been repaired/replaced. Moderate mitral valve regurgitation. There is a Mitra-Clip present in the mitral position. Mild mitral valve stenosis. MV peak gradient, 13.1 mmHg. The mean mitral valve gradient is 6.0 mmHg. Tricuspid Valve: The tricuspid valve is normal in structure. Tricuspid valve regurgitation is mild . No evidence of tricuspid stenosis. Aortic Valve: The aortic valve is normal in structure. Aortic valve regurgitation is not visualized. No aortic stenosis is present. Aortic valve mean gradient measures 3.0 mmHg. Aortic valve peak gradient measures 5.2 mmHg. Aortic valve area, by VTI measures 4.39 cm. Pulmonic Valve: The pulmonic valve was normal in structure. Pulmonic valve regurgitation is trivial. No evidence of pulmonic stenosis. Aorta: The aortic root is normal in size and structure. Pulmonary Artery: The pulmonary artery is moderately dilated. Venous: The inferior vena cava is normal in size with greater than 50% respiratory variability, suggesting right atrial pressure of 3 mmHg. IAS/Shunts: No atrial level shunt detected by color flow Doppler.  LEFT VENTRICLE PLAX 2D LVIDd:         5.20 cm   Diastology LVIDs:         2.40 cm   LV e' medial:    4.79 cm/s LV PW:         1.10 cm   LV E/e' medial:  34.0 LV IVS:        1.70 cm   LV e' lateral:   6.64 cm/s LVOT diam:     2.55 cm   LV E/e' lateral: 24.5 LV SV:         76 LV SV Index:   48 LVOT Area:     5.11 cm  RIGHT VENTRICLE RV Basal diam:  2.50 cm RV Mid diam:    3.20 cm RV S prime:     11.30 cm/s TAPSE (M-mode): 1.6 cm LEFT ATRIUM             Index        RIGHT ATRIUM           Index LA diam:        4.40 cm 2.80 cm/m   RA Area:     13.90 cm LA Vol (A2C):   61.1 ml 38.94 ml/m  RA Volume:    36.10 ml  23.01 ml/m LA Vol (A4C):   64.8 ml 41.30 ml/m LA Biplane Vol: 64.3 ml 40.98 ml/m  AORTIC VALVE AV Area (Vmax):    3.36 cm AV Area (Vmean):   3.85 cm AV Area (VTI):     4.39 cm AV Vmax:           114.00 cm/s AV Vmean:          72.400 cm/s AV VTI:  0.172 m AV Peak Grad:      5.2 mmHg AV Mean Grad:      3.0 mmHg LVOT Vmax:         75.00 cm/s LVOT Vmean:        54.600 cm/s LVOT VTI:          0.148 m LVOT/AV VTI ratio: 0.86  AORTA Ao Root diam: 3.30 cm MITRAL VALVE                TRICUSPID VALVE MV Area (PHT): 1.65 cm     TR Peak grad:   44.6 mmHg MV Area VTI:   1.24 cm     TR Vmax:        334.00 cm/s MV Peak grad:  13.1 mmHg MV Mean grad:  6.0 mmHg     SHUNTS MV Vmax:       1.81 m/s     Systemic VTI:  0.15 m MV Vmean:      111.0 cm/s   Systemic Diam: 2.55 cm MV Decel Time: 460 msec MV E velocity: 163.00 cm/s MV A velocity: 88.60 cm/s MV E/A ratio:  1.84 Lorine Bears MD Electronically signed by Lorine Bears MD Signature Date/Time: 02/10/2024/9:07:50 AM    Final     Assessment/Plan Closed right hip fracture Osteoporosis  Sustained a closed right hip fracture from a fall on February 09, 2024. Underwent surgical fixation with an intertrochanteric rod on February 10, 2024. Currently in physical therapy but reluctant to participate. The goal is to enhance mobility and strength to facilitate bed mobility and ambulation with a walker. Emphasized the importance of strength training and nutrition to improve outcomes. Prognosis is poor if no improvement in three weeks. - Encourage participation in physical therapy to improve mobility and strength - Monitor progress over the next few weeks to assess improvement - Provide assistance with eating due to difficulty lifting arm to mouth - Continue norco for pain control  Parkinson's Disease Significant tremor could be impaired due to her underlying disease. Continue sinemet as prescribed.   COPD Hx of chronic dyspnea, however, normal oxygen  levels.   CHF Mitral valve regurgitation, severe   Atrial Fibrillation  Rate controlled. No signs of bleeding Continue pacerone 100 mg and eliquis 2.5 mg  Allergic rhinitis Initiated on Benadryl for allergy control, but it poses risks of confusion, urinary retention, and constipation. Previously used Solicitor, with Zyrtec tried many years ago. Discussed switching to Flonase and Zyrtec as safer alternatives. - Discontinue Benadryl - Initiate treatment with Flonase and Zyrtec for allergy management  Goals of Care Frailty Expressed a consistent preference to avoid hospitalization and prioritize comfort if health changes occur. Desires to regain mobility, indicating a preference for quality of life over aggressive interventions. Does not wish to return to the [facility] if health deteriorates. - Discuss and document goals of care, emphasizing comfort and quality of life - Engage in shared decision-making regarding future hospitalizations and interventions, does not desire hospitalizations at this time.    Family/ staff Communication: nursing  Labs/tests ordered: BMP, CBC  I spent greater than ***  minutes for the care of this patient in face to face time, chart review, clinical documentation, patient education. I spent an additional 16 minutes discussing goals of care and advanced care planning.

## 2024-02-15 MED ORDER — HYDROCODONE-ACETAMINOPHEN 5-325 MG PO TABS: 1.0000 | ORAL_TABLET | Freq: Four times a day (QID) | ORAL | 0 refills | Status: DC | PRN

## 2024-02-16 ENCOUNTER — Encounter: Payer: Self-pay | Admitting: Student

## 2024-02-17 ENCOUNTER — Non-Acute Institutional Stay (SKILLED_NURSING_FACILITY): Payer: Self-pay | Admitting: Student

## 2024-02-17 ENCOUNTER — Encounter: Payer: Self-pay | Admitting: Student

## 2024-02-17 DIAGNOSIS — S52531D Colles' fracture of right radius, subsequent encounter for closed fracture with routine healing: Secondary | ICD-10-CM

## 2024-02-17 DIAGNOSIS — Z7189 Other specified counseling: Secondary | ICD-10-CM

## 2024-02-17 NOTE — Progress Notes (Addendum)
 Location:  Other Nursing Home Room Number: [facility] [address] Place of Service:  SNF [phone]) Provider:  Ander Gaster, Benetta Spar, MD  Patient Care Team: Earnestine Mealing, MD as PCP - General (Family Medicine) Iran Ouch, MD as PCP - Cardiology (Cardiology) Sidney Ace, MD as Referring Physician (Allergy) Jerene Bears, MD as Consulting Physician (Gynecology) Tat, Octaviano Batty, DO as Consulting Physician (Neurology) Serena Croissant, MD as Consulting Physician (Hematology and Oncology) Tat, Octaviano Batty, DO as Consulting Physician (Neurology) Kathyrn Sheriff, [facility] (Inactive) as Pharmacist (Pharmacist)  Extended Emergency Contact Information Primary Emergency Contact: Elson Areas [place], [place] [address] [place] Home Phone: [phone] Mobile Phone: [phone] Relation: Brother Secondary Emergency Contact: [facility] Phone: [phone] Mobile Phone: [phone] Relation: Niece Preferred language: English Interpreter needed? No  Code Status:  DNR Goals of care: Advanced Directive information    02/10/2024    9:00 AM  Advanced Directives  Does Patient Have a Medical Advance Directive? Yes  Type of Advance Directive Out of facility DNR (pink MOST or yellow form)  Does patient want to make changes to medical advance directive? No - Patient declined  Pre-existing out of facility DNR order (yellow form or pink MOST form) Pink Most/Yellow Form available - Physician notified to receive inpatient order;Pink MOST/Yellow Form most recent copy in chart - Physician notified to receive inpatient order  Advance Care Planning     Chief Complaint  Patient presents with   Decline    HPI:  Pt is a 83 y.o. female seen today for an acute visit for  Discussed the use of AI scribe software for clinical note transcription with the patient, who gave verbal consent to proceed.  History of Present Illness         The patient is an 83 year old female  who presents with concerns of a possible stroke.  She has experienced new vision changes over the past several weeks, including difficulty recognizing people visually and increased reliance on voice recognition. She has not yet consulted an ophthalmologist for further evaluation.  She reports significant pain in her arms, which limits her ability to cough or move them comfortably. She can perform some movements, such as touching her nose, but the pain is a limiting factor.  She has had poor oral intake over the last four days, with reduced eating and drinking, which is concerning for her overall health.  She is aware of her current location and the date, although she initially struggled to recall the exact name of the place and the day of the week. She correctly identifies the year and her upcoming birthday.  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 10/2021 Cath: Nl cors.   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 10/2021 RHC: PCWP V wave of 34; b. 01/2022 TEE: Severe flail/prolapse of large area of A2 scallop with severe posteriorly directed MR; c. 01/2022 TEER w /Mitraclip XTW x 2; d. 07/2022 Echo: EF 60-65%. No rwma, mild LVH, GrIII DD. Sev dil LA. Mod elev PASP. Mod-Sev MR. Mod MS.   Moderate mitral regurgitation by prior echocardiogram     Osteoporosis, post-menopausal 03/14/2012   Ovarian tumor of borderline malignancy, right 2004   Palpitations    a. 02/2022 Zio: Predominantly sinus rhythm at 67 (52-171).  2 runs of NSVT -fastest 9 beats at 167.  35 SVT runs-longest 19 beats, fastest 171x6 beats.   Pancreatitis    secondary to Cholelithiasis   Parkinson disease (HCC) 02/10/2015   Post-thoracotomy pain syndrome    Pseudomonas pneumonia (HCC) 03/2008   Status post implantation of mitral valve leaflet clip 02/15/2022   s/p XTW MitraClip x2 by Dr. Excell Seltzer in the A2/P2: b. 04/03/23 s/p third mitraclip implantation.   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   APPENDECTOMY  2004   BREAST BIOPSY  08/2004   right breast-benign   BREAST LUMPECTOMY  1992   benign   BUBBLE STUDY  12/27/2021   Procedure: BUBBLE STUDY;  Surgeon: Sande Rives, MD;  Location: [facility] ENDOSCOPY;  Service: Cardiovascular;;   CARDIAC CATHETERIZATION  2007   essentially negation for significant CAD   CARDIOVERSION N/A 09/05/2023   Procedure: CARDIOVERSION;  Surgeon: Debbe Odea, MD;  Location: [facility] ORS;  Service: Cardiovascular;  Laterality: N/A;   CATARACT EXTRACTION W/PHACO Left 07/24/2023   Procedure: CATARACT EXTRACTION PHACO AND INTRAOCULAR LENS PLACEMENT (IOC) LEFT 5.34 0037.3;  Surgeon: Lockie Mola, MD;  Location: [facility];  Service: Ophthalmology;  Laterality: Left;   CATARACT EXTRACTION W/PHACO Right 08/07/2023   Procedure: CATARACT EXTRACTION PHACO AND INTRAOCULAR LENS PLACEMENT (IOC) RIGHT 7.83 00:47.9;  Surgeon: Lockie Mola, MD;  Location: [facility]ERY [facility];  Service: Ophthalmology;  Laterality: Right;   CHOLECYSTECTOMY  2001   COLONOSCOPY  2014   Dr  Marina Goodell , due 2019   COLONOSCOPY  09/2018   2 TA removed, int hem, no f/u needed (Dr Marina Goodell)    ESOPHAGOGASTRODUODENOSCOPY  09/2018   GERD with esophagitis and stricture dilated, antral gastritis negative for H pylori Marina Goodell)   HIP ARTHROPLASTY Left 08/21/2022   Procedure: ARTHROPLASTY BIPOLAR HIP (HEMIARTHROPLASTY);  Surgeon: Juanell Fairly, MD;  Location: [facility] ORS;  Service: Orthopedics;  Laterality: Left;   INTRAMEDULLARY (IM) NAIL INTERTROCHANTERIC Right 02/10/2024   Procedure: FIXATION, FRACTURE, INTERTROCHANTERIC, WITH INTRAMEDULLARY ROD;  Surgeon: Christena Flake, MD;  Location: [facility] ORS;  Service: Orthopedics;  Laterality: Right;   LUNG BIOPSY  2002   [name], Dr Edwyna Shell   MASTECTOMY MODIFIED RADICAL Left 2002   oral chemotheraphy (tamoxifen then Armidex) no radiation, Dr.[name]   RIGHT/LEFT HEART CATH AND CORONARY ANGIOGRAPHY Bilateral 11/14/2021   Procedure: RIGHT/LEFT HEART CATH AND CORONARY ANGIOGRAPHY;  Surgeon: Iran Ouch, MD;  Location: [facility] INVASIVE CV LAB;  Service: Cardiovascular;  Laterality: Bilateral;   TEE WITHOUT CARDIOVERSION N/A 12/29/2018   Procedure: TRANSESOPHAGEAL ECHOCARDIOGRAM (TEE);  Surgeon: Iran Ouch, MD;  Location:  [facility] ORS;  Service: Cardiovascular;  Laterality: N/A;   TEE WITHOUT CARDIOVERSION N/A 12/27/2021   Procedure: TRANSESOPHAGEAL ECHOCARDIOGRAM (TEE);  Surgeon: Sande Rives, MD;  Location: [facility] ENDOSCOPY;  Service: Cardiovascular;  Laterality: N/A;   TEE WITHOUT CARDIOVERSION N/A 02/15/2022   Procedure: TRANSESOPHAGEAL ECHOCARDIOGRAM (TEE);  Surgeon: Tonny Bollman, MD;  Location: [facility] INVASIVE CV LAB;  Service: Cardiovascular;  Laterality: N/A;   TEE WITHOUT CARDIOVERSION N/A 05/14/2022   Procedure: TRANSESOPHAGEAL ECHOCARDIOGRAM (TEE);  Surgeon: Sande Rives, MD;  Location: [facility] ENDOSCOPY;  Service: Cardiovascular;  Laterality: N/A;   TEE WITHOUT CARDIOVERSION N/A 04/03/2023   Procedure: TRANSESOPHAGEAL ECHOCARDIOGRAM;  Surgeon:  Tonny Bollman, MD;  Location: [facility] INVASIVE CV LAB;  Service: Cardiovascular;  Laterality: N/A;   TEE WITHOUT CARDIOVERSION N/A 09/05/2023   Procedure: TRANSESOPHAGEAL ECHOCARDIOGRAM;  Surgeon: Debbe Odea, MD;  Location: [facility] ORS;  Service: Cardiovascular;  Laterality: N/A;   TONSILLECTOMY  1946   TRANSCATHETER MITRAL EDGE TO EDGE REPAIR N/A 02/15/2022   Procedure: MITRAL VALVE REPAIR;  Surgeon: Tonny Bollman, MD;  Location: [facility] INVASIVE CV LAB;  Service: Cardiovascular;  Laterality: N/A;   TRANSCATHETER MITRAL EDGE TO EDGE REPAIR N/A 04/03/2023   Procedure: MITRAL VALVE REPAIR;  Surgeon: Tonny Bollman, MD;  Location: [facility] INVASIVE CV LAB;  Service: Cardiovascular;  Laterality: N/A;    Allergies  Allergen Reactions   Sulfa Antibiotics Anaphylaxis   Sulfonamide Derivatives Anaphylaxis   Topamax [Topiramate] Other (See Comments)    Metabolic acidosis    Clarithromycin Other (See Comments)    pericarditis  Other reaction(s): Not available   Hydrocodone Other (See Comments)    "hyper and climbing the walls"   Motrin [Ibuprofen] Other (See Comments)    headaches   Misc. Sulfonamide Containing Compounds     Other reaction(s): Not available   Symbicort [Budesonide-Formoterol Fumarate] Other (See Comments)    02/07/15 tremor   Tetracycline Hcl Other (See Comments)   Percocet [Oxycodone-Acetaminophen] Itching and Rash   Tape Itching and Rash    Use paper tape only   Tetracyclines & Related Other (See Comments)    "immediate yeast infection"    Outpatient Encounter Medications as of 02/17/2024  Medication Sig   acetaminophen (TYLENOL) 500 MG tablet Take 1,000 mg by mouth 3 (three) times daily.   albuterol (VENTOLIN HFA) 108 (90 Base) MCG/ACT inhaler Inhale 2 puffs into the lungs every 6 (six) hours as needed for wheezing or shortness of breath.   amiodarone (PACERONE) 100 MG tablet Take 100 mg by mouth daily.   apixaban (ELIQUIS) 2.5 MG TABS tablet Take 1 tablet (2.5 mg total) by  mouth 2 (two) times daily.   bisacodyl (DULCOLAX) 10 MG suppository Place 10 mg rectally as needed for moderate constipation.   calcium carbonate (OSCAL) 1500 (600 Ca) MG TABS tablet Take 600 mg of elemental calcium by mouth daily.   calcium carbonate (TUMS - DOSED IN MG ELEMENTAL CALCIUM) 500 MG chewable tablet Chew 2 tablets by mouth daily as needed for indigestion or heartburn.   Carbidopa-Levodopa ER (SINEMET CR) 25-100 MG tablet controlled release Take 2 tablets by mouth 2 (two) times daily.   Carbidopa-Levodopa ER (SINEMET CR) 25-100 MG tablet controlled release Take 1 tablet by mouth daily before supper.   Cholecalciferol (VITAMIN D3) 25 MCG (1000 UT) CAPS Take 1 capsule (1,000 Units total) by mouth daily.   docusate sodium (COLACE) 100 MG capsule Take 100 mg by mouth 2 (two) times daily.   EPINEPHrine 0.3 mg/0.3 mL IJ SOAJ injection Inject  0.3 mg into the muscle as needed for anaphylaxis.   fluticasone (FLONASE) 50 MCG/ACT nasal spray Place 2 sprays into both nostrils daily as needed.   Fluticasone-Umeclidin-Vilant (TRELEGY ELLIPTA) 100-62.5-25 MCG/ACT AEPB Inhale 1 puff into the lungs daily.   furosemide (LASIX) 40 MG tablet TAKE ONE TABLET BY MOUTH ONCE DAILY *NEW DOSE* (Patient taking differently: Take 40 mg by mouth 2 (two) times daily.)   HYDROcodone-acetaminophen (NORCO/VICODIN) 5-325 MG tablet Take 1-2 tablets by mouth every 6 (six) hours as needed for moderate pain (pain score 4-6) or severe pain (pain score 7-10).   methocarbamol (ROBAXIN) 500 MG tablet Take 1 tablet (500 mg total) by mouth every 8 (eight) hours as needed for muscle spasms.   Multiple Vitamin (MULTIVITAMIN WITH MINERALS) TABS tablet Take 1 tablet by mouth daily.   nitroGLYCERIN (NITROSTAT) 0.4 MG SL tablet Place 0.4 mg under the tongue every 5 (five) minutes as needed for chest pain.   pantoprazole (PROTONIX) 20 MG tablet Take 1 tablet (20 mg total) by mouth daily.   polyethylene glycol (MIRALAX / GLYCOLAX) 17 g  packet Take 17 g by mouth daily as needed.   No facility-administered encounter medications on file as of 02/17/2024.    Review of Systems  Immunization History  Administered Date(s) Administered   Fluad Quad(high Dose 65+) 08/11/2019, 08/03/2020, 08/08/2021, 09/06/2022   Influenza Split 09/11/2011   Influenza Whole 09/09/2007, 08/08/2008, 08/15/2010, 08/26/2012   Influenza,inj,Quad PF,6+ Mos 08/18/2013, 08/20/2014, 08/25/2018   Influenza-Unspecified 09/27/2015, 08/26/2017, 09/18/2023   Moderna Covid-19 Fall Seasonal Vaccine 68yrs & older 10/25/2022   Moderna Covid-19 Vaccine Bivalent Booster 36yrs & up 04/24/2022, 03/05/2023   Moderna Sars-Covid-2 Vaccination 01/01/2020, 01/20/2020, 02/16/2020, 07/25/2020, 11/22/2020   PPD Test 08/24/2022   Pneumococcal Conjugate-13 02/12/2014   Pneumococcal Polysaccharide-23 11/26/2006, 10/21/2012, 11/22/2020, 11/21/2021   Rsv, Bivalent, Protein Subunit Rsvpref,pf Verdis Frederickson) 10/25/2022   Td 01/01/2011, 06/30/2021   Zoster Recombinant(Shingrix) 02/22/2018, 07/08/2019   Zoster, Live 05/26/2007   Pertinent  Health Maintenance Due  Topic Date Due   INFLUENZA VACCINE  Completed   DEXA SCAN  Completed   Colonoscopy  Discontinued      11/26/2022    9:30 AM 12/07/2022   11:43 AM 01/29/2023   10:32 AM 08/01/2023   10:29 AM 02/04/2024   12:45 PM  Fall Risk  Falls in the past year?  1 1 0 0  Was there an injury with Fall?  1 1 0 0  Fall Risk Category Calculator  3 3 0 0  Fall Risk Category (Retired)  High     (RETIRED) Patient Fall Risk Level High fall risk      Fall risk Follow up    Falls evaluation completed Falls evaluation completed   Functional Status Survey:    Vitals:   02/17/24 2128  BP: 108/60  Pulse: 60  Resp: 18  Temp: 98.7 F (37.1 C)  SpO2: 94%  Weight: 122 lb 6.4 oz (55.5 kg)   Body mass index is 22.39 kg/m. Physical Exam Constitutional:      Comments: Thin, frail  Cardiovascular:     Rate and Rhythm: Normal rate.      Pulses: Normal pulses.  Musculoskeletal:     Comments: Right hand bruising and swelling  Neurological:     Comments: Clear speech, able to lift bilateral extremities off the bed.      Labs reviewed: Recent Labs    09/04/23 1057 09/05/23 0521 02/10/24 0855 02/11/24 0354 02/12/24 0150  NA  --    < >  141 136 135  K  --    < > 4.3 4.2 4.2  CL  --    < > 102 98 100  CO2  --    < > 27 29 31   GLUCOSE  --    < > 119* 143* 105*  BUN  --    < > 22 31* 40*  CREATININE  --    < > 0.81 1.14* 1.00  CALCIUM  --    < > 9.1 9.0 8.7*  MG 2.1  --   --   --   --    < > = values in this interval not displayed.   Recent Labs    04/01/23 0816 08/05/23 1114 09/05/23 0521  AST 20 13 21   ALT 5 2 14   ALKPHOS 67 98 80  BILITOT 2.0* 1.0 1.5*  PROT 6.9 7.6 6.7  ALBUMIN 3.8 3.7 3.4*   Recent Labs    09/07/23 0313 09/08/23 0417 09/26/23 0000 02/09/24 1654 02/10/24 0855 02/11/24 0354 02/11/24 1335 02/12/24 0150 02/13/24 0550  WBC 6.2 6.0   < > 6.5   < > 9.4  --  10.2 9.0  NEUTROABS 4.8 4.3  --  4.3  --   --   --   --   --   HGB 11.8* 12.7   < > 12.9   < > 9.3* 9.3* 8.1* 8.4*  HCT 36.8 38.9   < > 39.8   < > 28.7*  --  25.1* 25.7*  MCV 100.0 99.0  --  99.5   < > 99.3  --  100.8* 99.6  PLT 140* 162   < > 158   < > 109*  --  98* 114*   < > = values in this interval not displayed.   Lab Results  Component Value Date   TSH 1.145 09/07/2023   Lab Results  Component Value Date   HGBA1C 5.3 01/14/2018   Lab Results  Component Value Date   CHOL 171 08/08/2021   HDL 53.30 08/08/2021   LDLCALC 97 08/08/2021   LDLDIRECT 135.0 08/16/2009   TRIG 100.0 08/08/2021   CHOLHDL 3 08/08/2021    Significant Diagnostic Results in last 30 days:  DG HIP UNILAT WITH PELVIS 2-3 VIEWS RIGHT Result Date: 02/10/2024 CLINICAL DATA:  Intraoperative fluoroscopy 4 dynamic hip screw, ORIF EXAM: Intraoperative fluoroscopy COMPARISON:  Preop x-ray 02/09/2024. FINDINGS: Four fluoroscopic spot images submitted  for review demonstrate placement dynamic hip screw transfixing the proximal intratrochanteric fracture. Long intramedullary rod. Imaging was obtained to aid in treatment. Expected alignment. Please correlate with real-time fluoroscopy of 1 minute and 13 seconds. Cumulative dose 10.09 mGy IMPRESSION: Intraoperative fluoroscopy Electronically Signed   By: Karen Kays M.D.   On: 02/10/2024 15:38   DG C-Arm 1-60 Min-No Report Result Date: 02/10/2024 Fluoroscopy was utilized by the requesting physician.  No radiographic interpretation.   DG C-Arm 1-60 Min-No Report Result Date: 02/10/2024 Fluoroscopy was utilized by the requesting physician.  No radiographic interpretation.   ECHOCARDIOGRAM COMPLETE Result Date: 02/10/2024    ECHOCARDIOGRAM REPORT   Patient Name:   Crystal Bass Date of Exam: 02/10/2024 Medical Rec #:  [id number]         Height:       62.0 in Accession #:    [id number]        Weight:       125.7 lb Date of Birth:  07/28/1941         BSA:  1.569 m Patient Age:    82 years          BP:           119/59 mmHg Patient Gender: F                 HR:           61 bpm. Exam Location:  [facility] Procedure: 2D Echo, Cardiac Doppler and Color Doppler (Both Spectral and Color            Flow Doppler were utilized during procedure). Indications:     Mitral valve disorder I05.9  History:         Patient has prior history of Echocardiogram examinations, most                  recent 09/05/2023. COPD; Risk Factors:Hypertension. History of                  mitral valve repair.                   Mitral Valve: Mitra-Clip valve is present in the mitral                  position.  Sonographer:     Cristela Blue Referring Phys:  [id number] CADENCE H FURTH Diagnosing Phys: Lorine Bears MD IMPRESSIONS  1. Left ventricular ejection fraction, by estimation, is 70 to 75%. The left ventricle has hyperdynamic function. The left ventricle has no regional wall motion abnormalities. There is moderate asymmetric left  ventricular hypertrophy of the septal segment. Left ventricular diastolic parameters are indeterminate.  2. Right ventricular systolic function is normal. The right ventricular size is normal. There is moderately elevated pulmonary artery systolic pressure.  3. Left atrial size was moderately dilated.  4. The mitral valve has been repaired/replaced. Moderate mitral valve regurgitation. Mild mitral stenosis. The mean mitral valve gradient is 6.0 mmHg. There is a Mitra-Clip present in the mitral position.  5. The aortic valve is normal in structure. Aortic valve regurgitation is not visualized. No aortic stenosis is present.  6. Moderately dilated pulmonary artery.  7. The inferior vena cava is normal in size with greater than 50% respiratory variability, suggesting right atrial pressure of 3 mmHg. FINDINGS  Left Ventricle: Left ventricular ejection fraction, by estimation, is 70 to 75%. The left ventricle has hyperdynamic function. The left ventricle has no regional wall motion abnormalities. The left ventricular internal cavity size was normal in size. There is moderate asymmetric left ventricular hypertrophy of the septal segment. Left ventricular diastolic parameters are indeterminate. Right Ventricle: The right ventricular size is normal. No increase in right ventricular wall thickness. Right ventricular systolic function is normal. There is moderately elevated pulmonary artery systolic pressure. The tricuspid regurgitant velocity is 3.34 m/s, and with an assumed right atrial pressure of 3 mmHg, the estimated right ventricular systolic pressure is 47.6 mmHg. Left Atrium: Left atrial size was moderately dilated. Right Atrium: Right atrial size was normal in size. Pericardium: There is no evidence of pericardial effusion. Mitral Valve: The mitral valve has been repaired/replaced. Moderate mitral valve regurgitation. There is a Mitra-Clip present in the mitral position. Mild mitral valve stenosis. MV peak gradient,  13.1 mmHg. The mean mitral valve gradient is 6.0 mmHg. Tricuspid Valve: The tricuspid valve is normal in structure. Tricuspid valve regurgitation is mild . No evidence of tricuspid stenosis. Aortic Valve: The aortic valve is normal in structure. Aortic valve regurgitation is not visualized. No  aortic stenosis is present. Aortic valve mean gradient measures 3.0 mmHg. Aortic valve peak gradient measures 5.2 mmHg. Aortic valve area, by VTI measures 4.39 cm. Pulmonic Valve: The pulmonic valve was normal in structure. Pulmonic valve regurgitation is trivial. No evidence of pulmonic stenosis. Aorta: The aortic root is normal in size and structure. Pulmonary Artery: The pulmonary artery is moderately dilated. Venous: The inferior vena cava is normal in size with greater than 50% respiratory variability, suggesting right atrial pressure of 3 mmHg. IAS/Shunts: No atrial level shunt detected by color flow Doppler.  LEFT VENTRICLE PLAX 2D LVIDd:         5.20 cm   Diastology LVIDs:         2.40 cm   LV e' medial:    4.79 cm/s LV PW:         1.10 cm   LV E/e' medial:  34.0 LV IVS:        1.70 cm   LV e' lateral:   6.64 cm/s LVOT diam:     2.55 cm   LV E/e' lateral: 24.5 LV SV:         76 LV SV Index:   48 LVOT Area:     5.11 cm  RIGHT VENTRICLE RV Basal diam:  2.50 cm RV Mid diam:    3.20 cm RV S prime:     11.30 cm/s TAPSE (M-mode): 1.6 cm LEFT ATRIUM             Index        RIGHT ATRIUM           Index LA diam:        4.40 cm 2.80 cm/m   RA Area:     13.90 cm LA Vol (A2C):   61.1 ml 38.94 ml/m  RA Volume:   36.10 ml  23.01 ml/m LA Vol (A4C):   64.8 ml 41.30 ml/m LA Biplane Vol: 64.3 ml 40.98 ml/m  AORTIC VALVE AV Area (Vmax):    3.36 cm AV Area (Vmean):   3.85 cm AV Area (VTI):     4.39 cm AV Vmax:           114.00 cm/s AV Vmean:          72.400 cm/s AV VTI:            0.172 m AV Peak Grad:      5.2 mmHg AV Mean Grad:      3.0 mmHg LVOT Vmax:         75.00 cm/s LVOT Vmean:        54.600 cm/s LVOT VTI:           0.148 m LVOT/AV VTI ratio: 0.86  AORTA Ao Root diam: 3.30 cm MITRAL VALVE                TRICUSPID VALVE MV Area (PHT): 1.65 cm     TR Peak grad:   44.6 mmHg MV Area VTI:   1.24 cm     TR Vmax:        334.00 cm/s MV Peak grad:  13.1 mmHg MV Mean grad:  6.0 mmHg     SHUNTS MV Vmax:       1.81 m/s     Systemic VTI:  0.15 m MV Vmean:      111.0 cm/s   Systemic Diam: 2.55 cm MV Decel Time: 460 msec MV E velocity: 163.00 cm/s MV A velocity: 88.60 cm/s MV E/A ratio:  1.84 Lorine Bears MD Electronically signed by Lorine Bears MD Signature Date/Time: 02/10/2024/9:07:50 AM    Final    DG Chest 1 View Result Date: 02/09/2024 CLINICAL DATA:  Larey Seat, pain EXAM: CHEST  1 VIEW COMPARISON:  09/04/2023 FINDINGS: Single frontal view of the chest demonstrates stable enlargement of the cardiac silhouette. Postsurgical changes are seen from mitral valve repair. There is increased consolidation within the medial right upper lung zone since prior study, consistent with airspace disease superimposed upon chronic atelectasis and bronchiectasis seen on prior CTs. Increased pulmonary vascular congestion. No effusion or pneumothorax. No acute bony abnormalities. IMPRESSION: 1. Enlarged cardiac silhouette. 2. Pulmonary vascular congestion. Increased consolidation within the medial right upper lung zone may reflect airspace disease superimposed upon known chronic scarring and bronchiectasis. Electronically Signed   By: Sharlet Salina M.D.   On: 02/09/2024 17:44   DG Hip Unilat W or Wo Pelvis 2-3 Views Right Result Date: 02/09/2024 CLINICAL DATA:  Right hip pain EXAM: DG HIP (WITH OR WITHOUT PELVIS) 2-3V RIGHT COMPARISON:  09/22/2022 FINDINGS: Acute, mildly comminuted intertrochanteric fracture of the proximal right femur with mild displacement and varus angulation. Hip joint alignment is maintained without dislocation. Prior left hip arthroplasty. Bony pelvis intact without evidence of fracture or diastasis. Soft tissue swelling  overlies the right hip laterally. IMPRESSION: Acute, mildly comminuted intertrochanteric fracture of the proximal right femur. Electronically Signed   By: Duanne Guess D.O.   On: 02/09/2024 17:43   DG Wrist Complete Right Result Date: 02/09/2024 CLINICAL DATA:  Larey Seat, right wrist pain EXAM: RIGHT WRIST - COMPLETE 3+ VIEW COMPARISON:  None Available. FINDINGS: Frontal, oblique, lateral, and ulnar deviated views of the right wrist are obtained. There is an impacted extra-articular fracture through the distal right radial metaphysis, with mild impaction at the fracture site. Anatomic alignment of the radiocarpal joint. Prominent osteoarthritis within the radial aspect of the carpus and at the first carpometacarpal joint. Mild soft tissue swelling. IMPRESSION: 1. Impacted extra-articular fracture of the distal radial metaphysis, with near anatomic alignment. 2. 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 3. Soft tissue swelling. Electronically Signed   By: Sharlet Salina M.D.   On: 02/09/2024 17:41    Assessment/Plan Goals of Care New onset vision changes over the past several weeks with significant visual impairment, recognizing provider by voice rather than sight. Concerns about potential stroke as a cause. No imaging was done in the hospital to evaluate for stroke. Declines imaging with CT scan at this time. Concerns about a potential stroke due to new vision changes and weakness. High risk for stroke due to cardiac history. Discussed that if a small bleed is present, the body may absorb it over time, but interventions are limited. Given patient's ability to move right arm volitionally as well as ability to speak despite right sided weakness, weakness is likely due to pain from fractures (colles and right hip fracture on 3/16).  Poor oral intake over the last four days, raising concerns about nutritional status and overall prognosis. Not eating and  drinking as much as needed, which could impact survival. Motivated to regain some independence and currently not interested in transitioning to hospice care. Aware of the poor prognosis but wants to continue with therapy to see if there is any improvement. Brother supports decision not to be hospitalized and to continue with palliative care. Therapy will continue to assess potential for improvement. Aware that transitioning to hospice would end therapy, and prefers to  try for improvement over the next few weeks before reconsidering hospice. - Continue physical therapy to assess potential for improvement - Discuss with brother and patient the decision not to pursue hospitalization - Continue palliative care - Arrange for chaplain to visit for spiritual support   Family/ staff Communication: brother and nursing  Labs/tests ordered:  none  I spent greater than 25  minutes for the care of this patient in face to face time, chart review, clinical documentation, patient education. I spent an additional 16 minutes discussing goals of care and advanced care planning.

## 2024-02-21 ENCOUNTER — Non-Acute Institutional Stay (SKILLED_NURSING_FACILITY): Payer: Self-pay | Admitting: Student

## 2024-02-21 ENCOUNTER — Encounter: Payer: Self-pay | Admitting: Student

## 2024-02-21 DIAGNOSIS — Z66 Do not resuscitate: Secondary | ICD-10-CM | POA: Diagnosis not present

## 2024-02-21 DIAGNOSIS — G8929 Other chronic pain: Secondary | ICD-10-CM | POA: Diagnosis not present

## 2024-02-21 DIAGNOSIS — R443 Hallucinations, unspecified: Secondary | ICD-10-CM | POA: Diagnosis not present

## 2024-02-21 DIAGNOSIS — E43 Unspecified severe protein-calorie malnutrition: Secondary | ICD-10-CM

## 2024-02-21 DIAGNOSIS — M792 Neuralgia and neuritis, unspecified: Secondary | ICD-10-CM

## 2024-02-21 DIAGNOSIS — R54 Age-related physical debility: Secondary | ICD-10-CM | POA: Diagnosis not present

## 2024-02-21 DIAGNOSIS — R82998 Other abnormal findings in urine: Secondary | ICD-10-CM | POA: Diagnosis not present

## 2024-02-21 DIAGNOSIS — G20A1 Parkinson's disease without dyskinesia, without mention of fluctuations: Secondary | ICD-10-CM | POA: Diagnosis not present

## 2024-02-21 NOTE — Progress Notes (Signed)
 [facility] Room Number: 516 A Place of Service:  SNF [id number]) Provider:  Earnestine Mealing, MD  Patient Care Team: Earnestine Mealing, MD as PCP - General (Family Medicine) Iran Ouch, MD as PCP - Cardiology (Cardiology) Sidney Ace, MD as Referring Physician (Allergy) Jerene Bears, MD as Consulting Physician (Gynecology) Tat, Octaviano Batty, DO as Consulting Physician (Neurology) Serena Croissant, MD as Consulting Physician (Hematology and Oncology) Tat, Octaviano Batty, DO as Consulting Physician (Neurology) Kathyrn Sheriff, [facility] (Inactive) as Pharmacist (Pharmacist)  Extended Emergency Contact Information Primary Emergency Contact: Elson Areas [place], [place] [address] [place] Home Phone: [phone] Mobile Phone: [phone] Relation: Brother Secondary Emergency Contact: [facility] Phone: [phone] Mobile Phone: [phone] Relation: Niece Preferred language: [place] Interpreter needed? No  Code Status:  DNR Goals of care: Advanced Directive information    02/21/2024   10:07 AM  Advanced Directives  Does Patient Have a Medical Advance Directive? Yes  Type of Estate agent of [place];Out of facility DNR (pink MOST or yellow form);Living will  Does patient want to make changes to medical advance directive? No - Patient declined  Copy of Healthcare Power of Attorney in Chart? Yes - validated most recent copy scanned in chart (See row information)  Pre-existing out of facility DNR order (yellow form or pink MOST form) Yellow form placed in chart (order not valid for inpatient use);Pink MOST form placed in chart (order not valid for inpatient use)     Chief Complaint  Patient presents with   Hallucinations    HPI:  Pt is a 83 y.o. female seen today for an acute visit for hallucinations.   History of Present Illness The patient presents with hallucinations and dark urine.  She  has been experiencing hallucinations this week, specifically visual hallucinations of bugs that are not present. These hallucinations are not bothersome, and she can recognize when they are not real. There is a history of poor tolerance to pain medications such as oxycodone and morphine, which may contribute to her current symptoms.  She reports significant pain related to a recent hip fracture and is currently on stronger pain medication. She has a history of poor tolerance to certain pain medications, which may be contributing to her hallucinations.  She also reports having dark urine, which she attributes to inadequate fluid intake. She acknowledges not drinking enough water. There is a concern about potential kidney issues due to dehydration.  She has experienced incontinence over the past week, with multiple bowel movements documented on certain days. She did not notify caregivers of the need for changes, leading to episodes of incontinence. The last bowel movement was two days ago.   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 10/2021 Cath: Nl cors.   Arthritis    Asthma    Breast cancer (HCC) 2002   infiltrative ductal carcinoma    Cataract 2019   Chronic heart failure with preserved ejection fraction (HFpEF) (HCC)    COPD (chronic obstructive pulmonary disease) (HCC) 07/2007   Coronary artery disease    Ductal carcinoma of breast, estrogen receptor positive, stage 1 (HCC) 09/16/2012   Endometriosis    GERD (gastroesophageal reflux disease)    Hx of mitral valve repair    Hypercalcemia 09/14/2013   Hypertension  Lesion of breast 1992   right, benign   Lung disease    secondary to [name] infection   Mastalgia 05/1994   Mitral regurgitation    a. 10/2021 RHC: PCWP V wave of 34; b. 01/2022 TEE: Severe flail/prolapse of large area of A2 scallop with severe posteriorly directed MR; c. 01/2022 TEER  w /Mitraclip XTW x 2; d. 07/2022 Echo: EF 60-65%. No rwma, mild LVH, GrIII DD. Sev dil LA. Mod elev PASP. Mod-Sev MR. Mod MS.   Moderate mitral regurgitation by prior echocardiogram    Osteoporosis, post-menopausal 03/14/2012   Ovarian tumor of borderline malignancy, right 2004   Palpitations    a. 02/2022 Zio: Predominantly sinus rhythm at 67 (52-171).  2 runs of NSVT -fastest 9 beats at 167.  35 SVT runs-longest 19 beats, fastest 171x6 beats.   Pancreatitis    secondary to Cholelithiasis   Parkinson disease (HCC) 02/10/2015   Post-thoracotomy pain syndrome    Pseudomonas pneumonia (HCC) 03/2008   Status post implantation of mitral valve leaflet clip 02/15/2022   s/p XTW MitraClip x2 by Dr. Excell Seltzer in the A2/P2: b. 04/03/23 s/p third mitraclip implantation.   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   APPENDECTOMY  2004   BREAST BIOPSY  08/2004   right breast-benign   BREAST LUMPECTOMY  1992   benign   BUBBLE STUDY  12/27/2021   Procedure: BUBBLE STUDY;  Surgeon: Sande Rives, MD;  Location: [facility] ENDOSCOPY;  Service: Cardiovascular;;   CARDIAC CATHETERIZATION  2007   essentially negation for significant CAD   CARDIOVERSION N/A 09/05/2023   Procedure: CARDIOVERSION;  Surgeon: Debbe Odea, MD;  Location: [facility] ORS;  Service: Cardiovascular;  Laterality: N/A;   CATARACT EXTRACTION W/PHACO Left 07/24/2023   Procedure: CATARACT EXTRACTION PHACO AND INTRAOCULAR LENS PLACEMENT (IOC) LEFT 5.34 0037.3;  Surgeon: Lockie Mola, MD;  Location: [facility];  Service: Ophthalmology;  Laterality: Left;   CATARACT EXTRACTION W/PHACO Right 08/07/2023   Procedure: CATARACT EXTRACTION PHACO AND INTRAOCULAR LENS PLACEMENT (IOC)  RIGHT 7.83 00:47.9;  Surgeon: Lockie Mola, MD;  Location[facility];  Service: Ophthalmology;  Laterality: Right;   CHOLECYSTECTOMY  2001   COLONOSCOPY  2014   Dr Marina Goodell , due 2019   COLONOSCOPY  09/2018   2 TA removed, int hem, no f/u needed (Dr Marina Goodell)    ESOPHAGOGASTRODUODENOSCOPY  09/2018   GERD with esophagitis and stricture dilated, antral gastritis negative for H pylori Marina Goodell)   HIP ARTHROPLASTY Left 08/21/2022   Procedure: ARTHROPLASTY BIPOLAR HIP (HEMIARTHROPLASTY);  Surgeon: Juanell Fairly, MD;  Location: [facility] ORS;  Service: Orthopedics;  Laterality: Left;   INTRAMEDULLARY (IM) NAIL INTERTROCHANTERIC Right 02/10/2024   Procedure: FIXATION, FRACTURE, INTERTROCHANTERIC, WITH INTRAMEDULLARY ROD;  Surgeon: Christena Flake, MD;  Location: [facility] ORS;  Service: Orthopedics;  Laterality: Right;   LUNG BIOPSY  2002   [name], Dr Edwyna Shell   MASTECTOMY MODIFIED RADICAL Left 2002   oral chemotheraphy (tamoxifen then Armidex) no radiation, Dr.[name]   RIGHT/LEFT HEART CATH AND CORONARY ANGIOGRAPHY Bilateral 11/14/2021   Procedure: RIGHT/LEFT HEART CATH AND CORONARY ANGIOGRAPHY;  Surgeon: Iran Ouch, MD;  Location: [facility] INVASIVE CV LAB;  Service: Cardiovascular;  Laterality: Bilateral;   TEE WITHOUT CARDIOVERSION N/A  12/29/2018   Procedure: TRANSESOPHAGEAL ECHOCARDIOGRAM (TEE);  Surgeon: Iran Ouch, MD;  Location: [facility] ORS;  Service: Cardiovascular;  Laterality: N/A;   TEE WITHOUT CARDIOVERSION N/A 12/27/2021   Procedure: TRANSESOPHAGEAL ECHOCARDIOGRAM (TEE);  Surgeon: Sande Rives, MD;  Location: [facility] ENDOSCOPY;  Service: Cardiovascular;  Laterality: N/A;   TEE WITHOUT CARDIOVERSION N/A 02/15/2022   Procedure: TRANSESOPHAGEAL ECHOCARDIOGRAM (TEE);  Surgeon: Tonny Bollman, MD;  Location: [facility] INVASIVE CV LAB;  Service: Cardiovascular;  Laterality: N/A;   TEE WITHOUT CARDIOVERSION N/A 05/14/2022   Procedure: TRANSESOPHAGEAL ECHOCARDIOGRAM (TEE);  Surgeon: Sande Rives, MD;  Location: [facility] ENDOSCOPY;  Service: Cardiovascular;  Laterality: N/A;   TEE WITHOUT CARDIOVERSION N/A 04/03/2023   Procedure: TRANSESOPHAGEAL ECHOCARDIOGRAM;  Surgeon: Tonny Bollman, MD;  Location: [facility] INVASIVE CV LAB;  Service: Cardiovascular;  Laterality: N/A;   TEE WITHOUT CARDIOVERSION N/A 09/05/2023   Procedure: TRANSESOPHAGEAL ECHOCARDIOGRAM;  Surgeon: Debbe Odea, MD;  Location: [facility] ORS;  Service: Cardiovascular;  Laterality: N/A;   TONSILLECTOMY  1946   TRANSCATHETER MITRAL EDGE TO EDGE REPAIR N/A 02/15/2022   Procedure: MITRAL VALVE REPAIR;  Surgeon: Tonny Bollman, MD;  Location: [facility] INVASIVE CV LAB;  Service: Cardiovascular;  Laterality: N/A;   TRANSCATHETER MITRAL EDGE TO EDGE REPAIR N/A 04/03/2023   Procedure: MITRAL VALVE REPAIR;  Surgeon: Tonny Bollman, MD;  Location: [facility] INVASIVE CV LAB;  Service: Cardiovascular;  Laterality: N/A;    Allergies  Allergen Reactions   Sulfa Antibiotics Anaphylaxis   Sulfonamide Derivatives Anaphylaxis   Topamax [Topiramate] Other (See Comments)    Metabolic acidosis    Clarithromycin Other (See Comments)    pericarditis  Other reaction(s): Not available   Hydrocodone Other (See Comments)    "hyper and climbing the walls"   Motrin [Ibuprofen] Other (See Comments)    headaches   Misc. Sulfonamide Containing Compounds     Other reaction(s): Not available   Symbicort [Budesonide-Formoterol Fumarate] Other (See Comments)    02/07/15 tremor   Tetracycline Hcl Other (See Comments)   Percocet [Oxycodone-Acetaminophen] Itching and Rash   Tape Itching and Rash    Use paper tape only   Tetracyclines & Related Other (See Comments)    "immediate yeast infection"    Outpatient Encounter Medications as of 02/21/2024  Medication Sig   acetaminophen (TYLENOL) 500 MG tablet Take 1,000 mg by mouth 3 (three) times daily.   albuterol (VENTOLIN HFA) 108 (90 Base) MCG/ACT inhaler Inhale 2 puffs into the lungs every 6 (six) hours as  needed for wheezing or shortness of breath.   amiodarone (PACERONE) 100 MG tablet Take 100 mg by mouth daily.   apixaban (ELIQUIS) 2.5 MG TABS tablet Take 1 tablet (2.5 mg total) by mouth 2 (two) times daily.   bisacodyl (DULCOLAX) 10 MG suppository Place 10 mg rectally as needed for moderate constipation.   calcium carbonate (OSCAL) 1500 (600 Ca) MG TABS tablet Take 600 mg of elemental calcium by mouth daily.   calcium carbonate (TUMS - DOSED IN MG ELEMENTAL CALCIUM) 500 MG chewable tablet Chew 2 tablets by mouth daily as needed for indigestion or heartburn.   Carbidopa-Levodopa ER (SINEMET CR) 25-100 MG tablet controlled release Take 2 tablets by mouth 2 (two) times daily.   Carbidopa-Levodopa ER (SINEMET CR) 25-100 MG tablet controlled release Take 1 tablet by mouth daily before supper.   cetirizine (ZYRTEC) 10 MG tablet Take 10 mg by mouth daily.   Cholecalciferol (VITAMIN D3) 25 MCG (1000 UT) CAPS Take 1 capsule (1,000 Units total) by mouth  daily.   docusate sodium (COLACE) 100 MG capsule Take 100 mg by mouth 2 (two) times daily.   EPINEPHrine 0.3 mg/0.3 mL IJ SOAJ injection Inject 0.3 mg into the muscle as needed for anaphylaxis.   fexofenadine (ALLEGRA) 30 MG/5ML suspension Take 30 mg by mouth 2 (two) times daily.   fluticasone (FLONASE) 50 MCG/ACT nasal spray Place 2 sprays into both nostrils daily as needed.   Fluticasone-Umeclidin-Vilant (TRELEGY ELLIPTA) 100-62.5-25 MCG/ACT AEPB Inhale 1 puff into the lungs daily.   furosemide (LASIX) 40 MG tablet TAKE ONE TABLET BY MOUTH ONCE DAILY *NEW DOSE*   gabapentin ([name]) 300 MG capsule Take 300 mg by mouth daily. For neuropathic pain   HYDROcodone-acetaminophen (NORCO/VICODIN) 5-325 MG tablet Take 1-2 tablets by mouth every 6 (six) hours as needed for moderate pain (pain score 4-6) or severe pain (pain score 7-10).   methocarbamol (ROBAXIN) 500 MG tablet Take 1 tablet (500 mg total) by mouth every 8 (eight) hours as needed for muscle  spasms.   Multiple Vitamin (MULTIVITAMIN WITH MINERALS) TABS tablet Take 1 tablet by mouth daily.   nitroGLYCERIN (NITROSTAT) 0.4 MG SL tablet Place 0.4 mg under the tongue every 5 (five) minutes as needed for chest pain.   pantoprazole (PROTONIX) 20 MG tablet Take 1 tablet (20 mg total) by mouth daily.   polyethylene glycol (MIRALAX / GLYCOLAX) 17 g packet Take 17 g by mouth daily as needed.   No facility-administered encounter medications on file as of 02/21/2024.    Review of Systems  Immunization History  Administered Date(s) Administered   Fluad Quad(high Dose 65+) 08/11/2019, 08/03/2020, 08/08/2021, 09/06/2022   Influenza Split 09/11/2011   Influenza Whole 09/09/2007, 08/08/2008, 08/15/2010, 08/26/2012   Influenza,inj,Quad PF,6+ Mos 08/18/2013, 08/20/2014, 08/25/2018   Influenza-Unspecified 09/27/2015, 08/26/2017, 09/18/2023   Moderna Covid-19 Fall Seasonal Vaccine 65yrs & older 10/25/2022   Moderna Covid-19 Vaccine Bivalent Booster 4yrs & up 04/24/2022, 03/05/2023   Moderna Sars-Covid-2 Vaccination 01/01/2020, 01/20/2020, 02/16/2020, 07/25/2020, 11/22/2020   PPD Test 08/24/2022   Pneumococcal Conjugate-13 02/12/2014   Pneumococcal Polysaccharide-23 11/26/2006, 10/21/2012, 11/22/2020, 11/21/2021   Rsv, Bivalent, Protein Subunit Rsvpref,pf Verdis Frederickson) 10/25/2022   Td 01/01/2011, 06/30/2021   Zoster Recombinant(Shingrix) 02/22/2018, 07/08/2019   Zoster, Live 05/26/2007   Pertinent  Health Maintenance Due  Topic Date Due   INFLUENZA VACCINE  Completed   DEXA SCAN  Completed   Colonoscopy  Discontinued      11/26/2022    9:30 AM 12/07/2022   11:43 AM 01/29/2023   10:32 AM 08/01/2023   10:29 AM 02/04/2024   12:45 PM  Fall Risk  Falls in the past year?  1 1 0 0  Was there an injury with Fall?  1 1 0 0  Fall Risk Category Calculator  3 3 0 0  Fall Risk Category (Retired)  High     (RETIRED) Patient Fall Risk Level High fall risk      Fall risk Follow up    Falls evaluation  completed Falls evaluation completed   Functional Status Survey:    Vitals:   02/21/24 0959  BP: (!) 115/56  Pulse: 62  Temp: 98.6 F (37 C)  SpO2: 93%  Weight: 122 lb (55.3 kg)  Height: 5\' 2"  (1.575 m)   Body mass index is 22.31 kg/m. Physical Exam Constitutional:      Comments: Thin, frail  Cardiovascular:     Rate and Rhythm: Normal rate.     Pulses: Normal pulses.  Pulmonary:     Effort: Pulmonary  effort is normal.  Skin:    General: Skin is warm and dry.  Neurological:     Mental Status: She is alert.     Labs reviewed: Recent Labs    09/04/23 1057 09/05/23 0521 02/10/24 0855 02/11/24 0354 02/12/24 0150  NA  --    < > 141 136 135  K  --    < > 4.3 4.2 4.2  CL  --    < > 102 98 100  CO2  --    < > 27 29 31   GLUCOSE  --    < > 119* 143* 105*  BUN  --    < > 22 31* 40*  CREATININE  --    < > 0.81 1.14* 1.00  CALCIUM  --    < > 9.1 9.0 8.7*  MG 2.1  --   --   --   --    < > = values in this interval not displayed.   Recent Labs    04/01/23 0816 08/05/23 1114 09/05/23 0521  AST 20 13 21   ALT 5 2 14   ALKPHOS 67 98 80  BILITOT 2.0* 1.0 1.5*  PROT 6.9 7.6 6.7  ALBUMIN 3.8 3.7 3.4*   Recent Labs    09/07/23 0313 09/08/23 0417 09/26/23 0000 02/09/24 1654 02/10/24 0855 02/11/24 0354 02/11/24 1335 02/12/24 0150 02/13/24 0550  WBC 6.2 6.0   < > 6.5   < > 9.4  --  10.2 9.0  NEUTROABS 4.8 4.3  --  4.3  --   --   --   --   --   HGB 11.8* 12.7   < > 12.9   < > 9.3* 9.3* 8.1* 8.4*  HCT 36.8 38.9   < > 39.8   < > 28.7*  --  25.1* 25.7*  MCV 100.0 99.0  --  99.5   < > 99.3  --  100.8* 99.6  PLT 140* 162   < > 158   < > 109*  --  98* 114*   < > = values in this interval not displayed.   Lab Results  Component Value Date   TSH 1.145 09/07/2023   Lab Results  Component Value Date   HGBA1C 5.3 01/14/2018   Lab Results  Component Value Date   CHOL 171 08/08/2021   HDL 53.30 08/08/2021   LDLCALC 97 08/08/2021   LDLDIRECT 135.0 08/16/2009    TRIG 100.0 08/08/2021   CHOLHDL 3 08/08/2021    Significant Diagnostic Results in last 30 days:  DG HIP UNILAT WITH PELVIS 2-3 VIEWS RIGHT Result Date: 02/10/2024 CLINICAL DATA:  Intraoperative fluoroscopy 4 dynamic hip screw, ORIF EXAM: Intraoperative fluoroscopy COMPARISON:  Preop x-ray 02/09/2024. FINDINGS: Four fluoroscopic spot images submitted for review demonstrate placement dynamic hip screw transfixing the proximal intratrochanteric fracture. Long intramedullary rod. Imaging was obtained to aid in treatment. Expected alignment. Please correlate with real-time fluoroscopy of 1 minute and 13 seconds. Cumulative dose 10.09 mGy IMPRESSION: Intraoperative fluoroscopy Electronically Signed   By: Karen Kays M.D.   On: 02/10/2024 15:38   DG C-Arm 1-60 Min-No Report Result Date: 02/10/2024 Fluoroscopy was utilized by the requesting physician.  No radiographic interpretation.   DG C-Arm 1-60 Min-No Report Result Date: 02/10/2024 Fluoroscopy was utilized by the requesting physician.  No radiographic interpretation.   ECHOCARDIOGRAM COMPLETE Result Date: 02/10/2024    ECHOCARDIOGRAM REPORT   Patient Name:   LERLENE TREADWELL Date of Exam: 02/10/2024 Medical Rec #:  [id number]         Height:       62.0 in Accession #:    [id number]        Weight:       125.7 lb Date of Birth:  30-Nov-1940         BSA:          1.569 m Patient Age:    82 years          BP:           119/59 mmHg Patient Gender: F                 HR:           61 bpm. Exam Location:  [facility] Procedure: 2D Echo, Cardiac Doppler and Color Doppler (Both Spectral and Color            Flow Doppler were utilized during procedure). Indications:     Mitral valve disorder I05.9  History:         Patient has prior history of Echocardiogram examinations, most                  recent 09/05/2023. COPD; Risk Factors:Hypertension. History of                  mitral valve repair.                   Mitral Valve: Mitra-Clip valve is present in the mitral                   position.  Sonographer:     Cristela Blue Referring Phys:  [id number] CADENCE H FURTH Diagnosing Phys: Lorine Bears MD IMPRESSIONS  1. Left ventricular ejection fraction, by estimation, is 70 to 75%. The left ventricle has hyperdynamic function. The left ventricle has no regional wall motion abnormalities. There is moderate asymmetric left ventricular hypertrophy of the septal segment. Left ventricular diastolic parameters are indeterminate.  2. Right ventricular systolic function is normal. The right ventricular size is normal. There is moderately elevated pulmonary artery systolic pressure.  3. Left atrial size was moderately dilated.  4. The mitral valve has been repaired/replaced. Moderate mitral valve regurgitation. Mild mitral stenosis. The mean mitral valve gradient is 6.0 mmHg. There is a Mitra-Clip present in the mitral position.  5. The aortic valve is normal in structure. Aortic valve regurgitation is not visualized. No aortic stenosis is present.  6. Moderately dilated pulmonary artery.  7. The inferior vena cava is normal in size with greater than 50% respiratory variability, suggesting right atrial pressure of 3 mmHg. FINDINGS  Left Ventricle: Left ventricular ejection fraction, by estimation, is 70 to 75%. The left ventricle has hyperdynamic function. The left ventricle has no regional wall motion abnormalities. The left ventricular internal cavity size was normal in size. There is moderate asymmetric left ventricular hypertrophy of the septal segment. Left ventricular diastolic parameters are indeterminate. Right Ventricle: The right ventricular size is normal. No increase in right ventricular wall thickness. Right ventricular systolic function is normal. There is moderately elevated pulmonary artery systolic pressure. The tricuspid regurgitant velocity is 3.34 m/s, and with an assumed right atrial pressure of 3 mmHg, the estimated right ventricular systolic pressure is 47.6 mmHg. Left Atrium:  Left atrial size was moderately dilated. Right Atrium: Right atrial size was normal in size. Pericardium: There is no evidence of pericardial effusion. Mitral Valve: The mitral valve has been repaired/replaced. Moderate mitral valve  regurgitation. There is a Mitra-Clip present in the mitral position. Mild mitral valve stenosis. MV peak gradient, 13.1 mmHg. The mean mitral valve gradient is 6.0 mmHg. Tricuspid Valve: The tricuspid valve is normal in structure. Tricuspid valve regurgitation is mild . No evidence of tricuspid stenosis. Aortic Valve: The aortic valve is normal in structure. Aortic valve regurgitation is not visualized. No aortic stenosis is present. Aortic valve mean gradient measures 3.0 mmHg. Aortic valve peak gradient measures 5.2 mmHg. Aortic valve area, by VTI measures 4.39 cm. Pulmonic Valve: The pulmonic valve was normal in structure. Pulmonic valve regurgitation is trivial. No evidence of pulmonic stenosis. Aorta: The aortic root is normal in size and structure. Pulmonary Artery: The pulmonary artery is moderately dilated. Venous: The inferior vena cava is normal in size with greater than 50% respiratory variability, suggesting right atrial pressure of 3 mmHg. IAS/Shunts: No atrial level shunt detected by color flow Doppler.  LEFT VENTRICLE PLAX 2D LVIDd:         5.20 cm   Diastology LVIDs:         2.40 cm   LV e' medial:    4.79 cm/s LV PW:         1.10 cm   LV E/e' medial:  34.0 LV IVS:        1.70 cm   LV e' lateral:   6.64 cm/s LVOT diam:     2.55 cm   LV E/e' lateral: 24.5 LV SV:         76 LV SV Index:   48 LVOT Area:     5.11 cm  RIGHT VENTRICLE RV Basal diam:  2.50 cm RV Mid diam:    3.20 cm RV S prime:     11.30 cm/s TAPSE (M-mode): 1.6 cm LEFT ATRIUM             Index        RIGHT ATRIUM           Index LA diam:        4.40 cm 2.80 cm/m   RA Area:     13.90 cm LA Vol (A2C):   61.1 ml 38.94 ml/m  RA Volume:   36.10 ml  23.01 ml/m LA Vol (A4C):   64.8 ml 41.30 ml/m LA Biplane Vol:  64.3 ml 40.98 ml/m  AORTIC VALVE AV Area (Vmax):    3.36 cm AV Area (Vmean):   3.85 cm AV Area (VTI):     4.39 cm AV Vmax:           114.00 cm/s AV Vmean:          72.400 cm/s AV VTI:            0.172 m AV Peak Grad:      5.2 mmHg AV Mean Grad:      3.0 mmHg LVOT Vmax:         75.00 cm/s LVOT Vmean:        54.600 cm/s LVOT VTI:          0.148 m LVOT/AV VTI ratio: 0.86  AORTA Ao Root diam: 3.30 cm MITRAL VALVE                TRICUSPID VALVE MV Area (PHT): 1.65 cm     TR Peak grad:   44.6 mmHg MV Area VTI:   1.24 cm     TR Vmax:        334.00 cm/s MV Peak grad:  13.1 mmHg MV  Mean grad:  6.0 mmHg     SHUNTS MV Vmax:       1.81 m/s     Systemic VTI:  0.15 m MV Vmean:      111.0 cm/s   Systemic Diam: 2.55 cm MV Decel Time: 460 msec MV E velocity: 163.00 cm/s MV A velocity: 88.60 cm/s MV E/A ratio:  1.84 Lorine Bears MD Electronically signed by Lorine Bears MD Signature Date/Time: 02/10/2024/9:07:50 AM    Final    DG Chest 1 View Result Date: 02/09/2024 CLINICAL DATA:  Larey Seat, pain EXAM: CHEST  1 VIEW COMPARISON:  09/04/2023 FINDINGS: Single frontal view of the chest demonstrates stable enlargement of the cardiac silhouette. Postsurgical changes are seen from mitral valve repair. There is increased consolidation within the medial right upper lung zone since prior study, consistent with airspace disease superimposed upon chronic atelectasis and bronchiectasis seen on prior CTs. Increased pulmonary vascular congestion. No effusion or pneumothorax. No acute bony abnormalities. IMPRESSION: 1. Enlarged cardiac silhouette. 2. Pulmonary vascular congestion. Increased consolidation within the medial right upper lung zone may reflect airspace disease superimposed upon known chronic scarring and bronchiectasis. Electronically Signed   By: Sharlet Salina M.D.   On: 02/09/2024 17:44   DG Hip Unilat W or Wo Pelvis 2-3 Views Right Result Date: 02/09/2024 CLINICAL DATA:  Right hip pain EXAM: DG HIP (WITH OR WITHOUT PELVIS)  2-3V RIGHT COMPARISON:  09/22/2022 FINDINGS: Acute, mildly comminuted intertrochanteric fracture of the proximal right femur with mild displacement and varus angulation. Hip joint alignment is maintained without dislocation. Prior left hip arthroplasty. Bony pelvis intact without evidence of fracture or diastasis. Soft tissue swelling overlies the right hip laterally. IMPRESSION: Acute, mildly comminuted intertrochanteric fracture of the proximal right femur. Electronically Signed   By: Duanne Guess D.O.   On: 02/09/2024 17:43   DG Wrist Complete Right Result Date: 02/09/2024 CLINICAL DATA:  Larey Seat, right wrist pain EXAM: RIGHT WRIST - COMPLETE 3+ VIEW COMPARISON:  None Available. FINDINGS: Frontal, oblique, lateral, and ulnar deviated views of the right wrist are obtained. There is an impacted extra-articular fracture through the distal right radial metaphysis, with mild impaction at the fracture site. Anatomic alignment of the radiocarpal joint. Prominent osteoarthritis within the radial aspect of the carpus and at the first carpometacarpal joint. Mild soft tissue swelling. IMPRESSION: 1. Impacted extra-articular fracture of the distal radial metaphysis, with near anatomic alignment. 2. 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 3. Soft tissue swelling. Electronically Signed   By: Sharlet Salina M.D.   On: 02/09/2024 17:41    Assessment/Plan DNR (do not resuscitate) - Plan: Do not attempt resuscitation (DNR)  Hallucinations  Parkinson's disease, unspecified whether dyskinesia present, unspecified whether manifestations fluctuate (HCC)  Chronic neuropathic pain  Brown-colored urine  Protein-calorie malnutrition, severe  Frailty Pain management post-hip fracture   Significant pain following recent hip fracture. Currently on stronger pain medication, which may contribute to hallucinations. Poor tolerance to oxycodone and morphine.   -  Continue current pain management regimen    Hallucinations   Experiencing hallucinations, specifically seeing bugs that are not present. Occurring this week, not currently bothersome or dangerous, and able to recognize she is not real. Likely a side effect of stronger pain medications for hip pain. Prefer not to start medication for hallucinations unless they become more disturbing or dangerous.   - Reassess if hallucinations become bothersome or dangerous    Protein deficiency  Frailty  Dark Urine Dark  urine likely due to dehydration. Admitted to insufficient fluid intake, which can lead to kidney issues. Emphasized importance of staying hydrated.  Concern for continued decline. High risk for complications.  - Encourage increased fluid intake   - Check urine to assess for potential kidney issues  Family/ staff Communication: Nursing  Labs/tests ordered:  UA w/ culture, CBC, BMP

## 2024-02-22 ENCOUNTER — Encounter: Payer: Self-pay | Admitting: Student

## 2024-02-24 LAB — BASIC METABOLIC PANEL WITH GFR
BUN: 14 (ref 4–21)
CO2: 30 — AB (ref 13–22)
Chloride: 99 (ref 99–108)
Creatinine: 0.6 (ref 0.5–1.1)
Glucose: 84
Potassium: 3.6 meq/L (ref 3.5–5.1)
Sodium: 136 — AB (ref 137–147)

## 2024-02-24 LAB — CBC AND DIFFERENTIAL
HCT: 25 — AB (ref 36–46)
Hemoglobin: 7.7 — AB (ref 12.0–16.0)
Neutrophils Absolute: 8261
Platelets: 342 10*3/uL (ref 150–400)
WBC: 9.8

## 2024-02-24 LAB — COMPREHENSIVE METABOLIC PANEL WITH GFR
Calcium: 8 — AB (ref 8.7–10.7)
eGFR: 91

## 2024-02-24 LAB — CBC: RBC: 2.51 — AB (ref 3.87–5.11)

## 2024-02-26 ENCOUNTER — Other Ambulatory Visit: Payer: Self-pay | Admitting: Student

## 2024-02-26 DIAGNOSIS — S72002D Fracture of unspecified part of neck of left femur, subsequent encounter for closed fracture with routine healing: Secondary | ICD-10-CM

## 2024-02-26 DIAGNOSIS — Z515 Encounter for palliative care: Secondary | ICD-10-CM | POA: Insufficient documentation

## 2024-02-26 MED ORDER — HYDROCODONE-ACETAMINOPHEN 5-325 MG PO TABS: 1.0000 | ORAL_TABLET | Freq: Four times a day (QID) | ORAL | 0 refills | Status: DC | PRN

## 2024-02-26 NOTE — Progress Notes (Signed)
 Refill chronic pain medication

## 2024-02-28 ENCOUNTER — Non-Acute Institutional Stay (SKILLED_NURSING_FACILITY): Payer: Self-pay | Admitting: Student

## 2024-02-28 ENCOUNTER — Encounter: Payer: Self-pay | Admitting: Student

## 2024-02-28 DIAGNOSIS — S52531D Colles' fracture of right radius, subsequent encounter for closed fracture with routine healing: Secondary | ICD-10-CM

## 2024-02-28 DIAGNOSIS — R238 Other skin changes: Secondary | ICD-10-CM

## 2024-02-28 DIAGNOSIS — S72002D Fracture of unspecified part of neck of left femur, subsequent encounter for closed fracture with routine healing: Secondary | ICD-10-CM

## 2024-02-28 DIAGNOSIS — E43 Unspecified severe protein-calorie malnutrition: Secondary | ICD-10-CM

## 2024-02-28 DIAGNOSIS — R300 Dysuria: Secondary | ICD-10-CM | POA: Diagnosis not present

## 2024-02-28 NOTE — Progress Notes (Signed)
 Location:  [facility] clinic  Provider: Dr. Earnestine Mealing  Code Status: DNR Goals of Care:     02/21/2024   10:07 AM  Advanced Directives  Does Patient Have a Medical Advance Directive? Yes  Type of Estate agent of [place];Out of facility DNR (pink MOST or yellow form);Living will  Does patient want to make changes to medical advance directive? No - Patient declined  Copy of Healthcare Power of Attorney in Chart? Yes - validated most recent copy scanned in chart (See row information)  Pre-existing out of facility DNR order (yellow form or pink MOST form) Yellow form placed in chart (order not valid for inpatient use);Pink MOST form placed in chart (order not valid for inpatient use)     Chief Complaint  Patient presents with   Medical Management of Chronic Issues    Medical Management of Chronic Issues.     HPI: Patient is a 83 y.o. female seen today for medical management of chronic diseases.   History of Present Illness The patient presents with pain and swelling in the lower back and legs.  She experiences significant pain in her lower back, describing it as spreading across the area. The pain is persistent and not fully relieved by her current pain medication regimen, although maintaining a regular schedule seems to help manage it somewhat.  She has noticed swelling in her legs, attributed to immobility and fluid retention. Swelling is present on both sides, with the right side expected due to a recent fracture and surgery, but now also present on the left side.  She is experiencing discomfort from pressure sores on her lower back, which are painful and possibly at a stage two level. She wants to relieve pressure from the area by repositioning herself.  She mentions a burning sensation during urination that started today, and she has experienced similar symptoms in the past, suggesting a possible urinary tract infection. She is unsure if she has received any  antibiotics recently.  She has been participating in physical therapy but feels she is not making significant progress. She managed two pivots yesterday but feels discouraged about her overall progress.  She is currently using pain medication, but the exact dosage and frequency are not specified. She also mentions nausea and has been given medication for it recently, around 7 to 8 o'clock. ]    Past Medical History:  Diagnosis Date   Aneurysm of aorta (HCC)    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   Anginal pain (HCC)    a. 10/2021 Cath: Nl cors.   Arthritis    Asthma    Breast cancer (HCC) 2002   infiltrative ductal carcinoma    Cataract 2019   Chronic heart failure with preserved ejection fraction (HFpEF) (HCC)    COPD (chronic obstructive pulmonary disease) (HCC) 07/2007   Coronary artery disease    Ductal carcinoma of breast, estrogen receptor positive, stage 1 (HCC) 09/16/2012   Endometriosis    GERD (gastroesophageal reflux disease)    Hx of mitral valve repair    Hypercalcemia 09/14/2013   Hypertension    Lesion of breast 1992   right, benign   Lung disease    secondary to [name] infection   Mastalgia 05/1994   Mitral regurgitation    a. 10/2021 RHC: PCWP V wave of 34; b. 01/2022 TEE: Severe flail/prolapse of large area of A2 scallop with severe posteriorly directed MR; c. 01/2022 TEER w /Mitraclip XTW x 2; d. 07/2022 Echo:  EF 60-65%. No rwma, mild LVH, GrIII DD. Sev dil LA. Mod elev PASP. Mod-Sev MR. Mod MS.   Moderate mitral regurgitation by prior echocardiogram    Osteoporosis, post-menopausal 03/14/2012   Ovarian tumor of borderline malignancy, right 2004   Palpitations    a. 02/2022 Zio: Predominantly sinus rhythm at 67 (52-171).  2 runs of NSVT -fastest 9 beats at 167.  35 SVT runs-longest 19 beats, fastest 171x6 beats.   Pancreatitis    secondary to Cholelithiasis   Parkinson disease (HCC) 02/10/2015   Post-thoracotomy pain syndrome    Pseudomonas pneumonia (HCC)  03/2008   Status post implantation of mitral valve leaflet clip 02/15/2022   s/p XTW MitraClip x2 by Dr. Excell Seltzer in the A2/P2: b. 04/03/23 s/p third mitraclip implantation.   Traumatic closed fracture of distal clavicle with minimal displacement, left, initial encounter 06/2021   [facility]   Uterine fibroid    Vitamin D deficiency     Past Surgical History:  Procedure Laterality Date   ABDOMINAL HYSTERECTOMY     & BSO for Mucinous borderline tumor of R ovary 2004   APPENDECTOMY  2004   BREAST BIOPSY  08/2004   right breast-benign   BREAST LUMPECTOMY  1992   benign   BUBBLE STUDY  12/27/2021   Procedure: BUBBLE STUDY;  Surgeon: Sande Rives, MD;  Location: [facility];  Service: Cardiovascular;;   CARDIAC CATHETERIZATION  2007   essentially negation for significant CAD   CARDIOVERSION N/A 09/05/2023   Procedure: CARDIOVERSION;  Surgeon: Debbe Odea, MD;  Location: [facility] ORS;  Service: Cardiovascular;  Laterality: N/A;   CATARACT EXTRACTION W/PHACO Left 07/24/2023   Procedure: CATARACT EXTRACTION PHACO AND INTRAOCULAR LENS PLACEMENT (IOC) LEFT 5.34 0037.3;  Surgeon: Lockie Mola, MD;  Location: [facility];  Service: Ophthalmology;  Laterality: Left;   CATARACT EXTRACTION W/PHACO Right 08/07/2023   Procedure: CATARACT EXTRACTION PHACO AND INTRAOCULAR LENS PLACEMENT (IOC) RIGHT 7.83 00:47.9;  Surgeon: Lockie Mola, MD;  Location: [facility]ERY [facility];  Service: Ophthalmology;  Laterality: Right;   CHOLECYSTECTOMY  2001   COLONOSCOPY  2014   Dr Marina Goodell , due 2019   COLONOSCOPY  09/2018   2 TA removed, int hem, no f/u needed (Dr Marina Goodell)    ESOPHAGOGASTRODUODENOSCOPY  09/2018   GERD with esophagitis and stricture dilated, antral gastritis negative for H pylori Marina Goodell)   HIP ARTHROPLASTY Left 08/21/2022   Procedure: ARTHROPLASTY BIPOLAR HIP (HEMIARTHROPLASTY);  Surgeon: Juanell Fairly, MD;  Location: [facility] ORS;  Service: Orthopedics;  Laterality: Left;    INTRAMEDULLARY (IM) NAIL INTERTROCHANTERIC Right 02/10/2024   Procedure: FIXATION, FRACTURE, INTERTROCHANTERIC, WITH INTRAMEDULLARY ROD;  Surgeon: Christena Flake, MD;  Location: [facility] ORS;  Service: Orthopedics;  Laterality: Right;   LUNG BIOPSY  2002   [name], Dr Edwyna Shell   MASTECTOMY MODIFIED RADICAL Left 2002   oral chemotheraphy (tamoxifen then Armidex) no radiation, Dr.[name]   RIGHT/LEFT HEART CATH AND CORONARY ANGIOGRAPHY Bilateral 11/14/2021   Procedure: RIGHT/LEFT HEART CATH AND CORONARY ANGIOGRAPHY;  Surgeon: Iran Ouch, MD;  Location: [facility] INVASIVE CV LAB;  Service: Cardiovascular;  Laterality: Bilateral;   TEE WITHOUT CARDIOVERSION N/A 12/29/2018   Procedure: TRANSESOPHAGEAL ECHOCARDIOGRAM (TEE);  Surgeon: Iran Ouch, MD;  Location: [facility] ORS;  Service: Cardiovascular;  Laterality: N/A;   TEE WITHOUT CARDIOVERSION N/A 12/27/2021   Procedure: TRANSESOPHAGEAL ECHOCARDIOGRAM (TEE);  Surgeon: Sande Rives, MD;  Location: [facility] ENDOSCOPY;  Service: Cardiovascular;  Laterality: N/A;   TEE WITHOUT CARDIOVERSION N/A 02/15/2022   Procedure: TRANSESOPHAGEAL ECHOCARDIOGRAM (  TEE);  Surgeon: Tonny Bollman, MD;  Location: [facility] INVASIVE CV LAB;  Service: Cardiovascular;  Laterality: N/A;   TEE WITHOUT CARDIOVERSION N/A 05/14/2022   Procedure: TRANSESOPHAGEAL ECHOCARDIOGRAM (TEE);  Surgeon: Sande Rives, MD;  Location: [facility];  Service: Cardiovascular;  Laterality: N/A;   TEE WITHOUT CARDIOVERSION N/A 04/03/2023   Procedure: TRANSESOPHAGEAL ECHOCARDIOGRAM;  Surgeon: Tonny Bollman, MD;  Location: [facility] INVASIVE CV LAB;  Service: Cardiovascular;  Laterality: N/A;   TEE WITHOUT CARDIOVERSION N/A 09/05/2023   Procedure: TRANSESOPHAGEAL ECHOCARDIOGRAM;  Surgeon: Debbe Odea, MD;  Location: [facility] ORS;  Service: Cardiovascular;  Laterality: N/A;   TONSILLECTOMY  1946   TRANSCATHETER MITRAL EDGE TO EDGE REPAIR N/A 02/15/2022   Procedure: MITRAL VALVE REPAIR;  Surgeon: Tonny Bollman, MD;  Location: [facility] INVASIVE CV LAB;  Service: Cardiovascular;  Laterality: N/A;   TRANSCATHETER MITRAL EDGE TO EDGE REPAIR N/A 04/03/2023   Procedure: MITRAL VALVE REPAIR;  Surgeon: Tonny Bollman, MD;  Location: [facility] INVASIVE CV LAB;  Service: Cardiovascular;  Laterality: N/A;    Allergies  Allergen Reactions   Sulfa Antibiotics Anaphylaxis   Sulfonamide Derivatives Anaphylaxis   Topamax [Topiramate] Other (See Comments)    Metabolic acidosis    Clarithromycin Other (See Comments)    pericarditis  Other reaction(s): Not available   Hydrocodone Other (See Comments)    "hyper and climbing the walls"   Motrin [Ibuprofen] Other (See Comments)    headaches   Misc. Sulfonamide Containing Compounds     Other reaction(s): Not available   Symbicort [Budesonide-Formoterol Fumarate] Other (See Comments)    02/07/15 tremor   Tetracycline Hcl Other (See Comments)   Percocet [Oxycodone-Acetaminophen] Itching and Rash   Tape Itching and Rash    Use paper tape only   Tetracyclines & Related Other (See Comments)    "immediate yeast infection"    Outpatient Encounter Medications as of 02/28/2024  Medication Sig   acetaminophen (TYLENOL) 500 MG tablet Take 1,000 mg by mouth 3 (three) times daily.   albuterol (VENTOLIN HFA) 108 (90 Base) MCG/ACT inhaler Inhale 2 puffs into the lungs every 6 (six) hours as needed for wheezing or shortness of breath.   amiodarone (PACERONE) 100 MG tablet Take 100 mg by mouth daily.   apixaban (ELIQUIS) 2.5 MG TABS tablet Take 1 tablet (2.5 mg total) by mouth 2 (two) times daily.   bisacodyl (DULCOLAX) 10 MG suppository Place 10 mg rectally as needed for moderate constipation.   calcium carbonate (OSCAL) 1500 (600 Ca) MG TABS tablet Take 600 mg of elemental calcium by mouth daily.   calcium carbonate (TUMS - DOSED IN MG ELEMENTAL CALCIUM) 500 MG chewable tablet Chew 2 tablets by mouth daily as needed for indigestion or heartburn.   Carbidopa-Levodopa ER (SINEMET  CR) 25-100 MG tablet controlled release Take 2 tablets by mouth 2 (two) times daily.   Carbidopa-Levodopa ER (SINEMET CR) 25-100 MG tablet controlled release Take 1 tablet by mouth daily before supper.   cetirizine (ZYRTEC) 10 MG tablet Take 10 mg by mouth daily.   Cholecalciferol (VITAMIN D3) 25 MCG (1000 UT) CAPS Take 1 capsule (1,000 Units total) by mouth daily.   docusate sodium (COLACE) 100 MG capsule Take 100 mg by mouth 2 (two) times daily.   EPINEPHrine 0.3 mg/0.3 mL IJ SOAJ injection Inject 0.3 mg into the muscle as needed for anaphylaxis.   fexofenadine (ALLEGRA) 30 MG/5ML suspension Take 30 mg by mouth 2 (two) times daily.   fluticasone (FLONASE) 50 MCG/ACT nasal spray Place 2 sprays  into both nostrils daily as needed.   Fluticasone-Umeclidin-Vilant (TRELEGY ELLIPTA) 100-62.5-25 MCG/ACT AEPB Inhale 1 puff into the lungs daily.   furosemide (LASIX) 40 MG tablet TAKE ONE TABLET BY MOUTH ONCE DAILY *NEW DOSE*   gabapentin ([name]) 300 MG capsule Take 300 mg by mouth 2 (two) times daily. For neuropathic pain. Give one Capsule by mouth once daily.   HYDROcodone-acetaminophen (NORCO/VICODIN) 5-325 MG tablet Take 1-2 tablets by mouth every 6 (six) hours as needed for moderate pain (pain score 4-6) or severe pain (pain score 7-10).   methocarbamol (ROBAXIN) 500 MG tablet Take 1 tablet (500 mg total) by mouth every 8 (eight) hours as needed for muscle spasms.   Multiple Vitamin (MULTIVITAMIN WITH MINERALS) TABS tablet Take 1 tablet by mouth daily.   nitroGLYCERIN (NITROSTAT) 0.4 MG SL tablet Place 0.4 mg under the tongue every 5 (five) minutes as needed for chest pain.   pantoprazole (PROTONIX) 20 MG tablet Take 1 tablet (20 mg total) by mouth daily.   polyethylene glycol (MIRALAX / GLYCOLAX) 17 g packet Take 17 g by mouth daily as needed.   No facility-administered encounter medications on file as of 02/28/2024.    Review of Systems:  Review of Systems  Health Maintenance  Topic Date Due    COVID-19 Vaccine (9 - 2024-25 season) 03/01/2024 (Originally 07/28/2023)   INFLUENZA VACCINE  06/26/2024   DTaP/Tdap/Td (3 - Tdap) 07/01/2031   Pneumonia Vaccine 83+ Years old  Completed   DEXA SCAN  Completed   Zoster Vaccines- Shingrix  Completed   HPV VACCINES  Aged Out   Colonoscopy  Discontinued    Physical Exam: Vitals:   02/28/24 0848  BP: 94/62  Pulse: 66  Resp: 18  Temp: 98.7 F (37.1 C)  SpO2: 94%  Weight: 125 lb 3.2 oz (56.8 kg)  Height: 5\' 2"  (1.575 m)   Body mass index is 22.9 kg/m. Physical Exam  Physical Exam EXTREMITIES: Dependent edema and bilateral swelling noted. Swelling on the right side due to fracture. SKIN: Bilateral gluteal clefts with skin sheering. Surgical Incision site well-healed with Steri strips.   Labs reviewed: Basic Metabolic Panel: Recent Labs    09/04/23 1057 09/05/23 0521 09/07/23 0311 09/07/23 0313 02/10/24 0855 02/11/24 0354 02/12/24 0150 02/24/24 0000  NA  --    < >  --    < > 141 136 135 136*  K  --    < >  --    < > 4.3 4.2 4.2 3.6  CL  --    < >  --    < > 102 98 100 99  CO2  --    < >  --    < > 27 29 31  30*  GLUCOSE  --    < >  --    < > 119* 143* 105*  --   BUN  --    < >  --    < > 22 31* 40* 14  CREATININE  --    < >  --    < > 0.81 1.14* 1.00 0.6  CALCIUM  --    < >  --    < > 9.1 9.0 8.7* 8.0*  MG 2.1  --   --   --   --   --   --   --   TSH  --   --  1.145  --   --   --   --   --    < > =  values in this interval not displayed.   Liver Function Tests: Recent Labs    04/01/23 0816 08/05/23 1114 09/05/23 0521  AST 20 13 21   ALT 5 2 14   ALKPHOS 67 98 80  BILITOT 2.0* 1.0 1.5*  PROT 6.9 7.6 6.7  ALBUMIN 3.8 3.7 3.4*   No results for input(s): "LIPASE", "AMYLASE" in the last 8760 hours. No results for input(s): "AMMONIA" in the last 8760 hours. CBC: Recent Labs    09/08/23 0417 09/26/23 0000 02/09/24 1654 02/10/24 0855 02/11/24 0354 02/11/24 1335 02/12/24 0150 02/13/24 0550 02/24/24 0000   WBC 6.0   < > 6.5   < > 9.4  --  10.2 9.0 9.8  NEUTROABS 4.3  --  4.3  --   --   --   --   --  8,261.00  HGB 12.7   < > 12.9   < > 9.3*   < > 8.1* 8.4* 7.7*  HCT 38.9   < > 39.8   < > 28.7*  --  25.1* 25.7* 25*  MCV 99.0  --  99.5   < > 99.3  --  100.8* 99.6  --   PLT 162   < > 158   < > 109*  --  98* 114* 342   < > = values in this interval not displayed.   Lipid Panel: No results for input(s): "CHOL", "HDL", "LDLCALC", "TRIG", "CHOLHDL", "LDLDIRECT" in the last 8760 hours. Lab Results  Component Value Date   HGBA1C 5.3 01/14/2018    Procedures since last visit: DG HIP UNILAT WITH PELVIS 2-3 VIEWS RIGHT Result Date: 02/10/2024 CLINICAL DATA:  Intraoperative fluoroscopy 4 dynamic hip screw, ORIF EXAM: Intraoperative fluoroscopy COMPARISON:  Preop x-ray 02/09/2024. FINDINGS: Four fluoroscopic spot images submitted for review demonstrate placement dynamic hip screw transfixing the proximal intratrochanteric fracture. Long intramedullary rod. Imaging was obtained to aid in treatment. Expected alignment. Please correlate with real-time fluoroscopy of 1 minute and 13 seconds. Cumulative dose 10.09 mGy IMPRESSION: Intraoperative fluoroscopy Electronically Signed   By: Karen Kays M.D.   On: 02/10/2024 15:38   DG C-Arm 1-60 Min-No Report Result Date: 02/10/2024 Fluoroscopy was utilized by the requesting physician.  No radiographic interpretation.   DG C-Arm 1-60 Min-No Report Result Date: 02/10/2024 Fluoroscopy was utilized by the requesting physician.  No radiographic interpretation.   ECHOCARDIOGRAM COMPLETE Result Date: 02/10/2024    ECHOCARDIOGRAM REPORT   Patient Name:   LAKELY ELMENDORF Date of Exam: 02/10/2024 Medical Rec #:  [id number]         Height:       62.0 in Accession #:    [id number]        Weight:       125.7 lb Date of Birth:  1941/04/16         BSA:          1.569 m Patient Age:    82 years          BP:           119/59 mmHg Patient Gender: F                 HR:           61  bpm. Exam Location:  [facility] Procedure: 2D Echo, Cardiac Doppler and Color Doppler (Both Spectral and Color            Flow Doppler were utilized during procedure). Indications:     Mitral valve disorder I05.9  History:         Patient has prior history of Echocardiogram examinations, most                  recent 09/05/2023. COPD; Risk Factors:Hypertension. History of                  mitral valve repair.                   Mitral Valve: Mitra-Clip valve is present in the mitral                  position.  Sonographer:     Cristela Blue Referring Phys:  [id number] CADENCE H FURTH Diagnosing Phys: Lorine Bears MD IMPRESSIONS  1. Left ventricular ejection fraction, by estimation, is 70 to 75%. The left ventricle has hyperdynamic function. The left ventricle has no regional wall motion abnormalities. There is moderate asymmetric left ventricular hypertrophy of the septal segment. Left ventricular diastolic parameters are indeterminate.  2. Right ventricular systolic function is normal. The right ventricular size is normal. There is moderately elevated pulmonary artery systolic pressure.  3. Left atrial size was moderately dilated.  4. The mitral valve has been repaired/replaced. Moderate mitral valve regurgitation. Mild mitral stenosis. The mean mitral valve gradient is 6.0 mmHg. There is a Mitra-Clip present in the mitral position.  5. The aortic valve is normal in structure. Aortic valve regurgitation is not visualized. No aortic stenosis is present.  6. Moderately dilated pulmonary artery.  7. The inferior vena cava is normal in size with greater than 50% respiratory variability, suggesting right atrial pressure of 3 mmHg. FINDINGS  Left Ventricle: Left ventricular ejection fraction, by estimation, is 70 to 75%. The left ventricle has hyperdynamic function. The left ventricle has no regional wall motion abnormalities. The left ventricular internal cavity size was normal in size. There is moderate asymmetric left ventricular  hypertrophy of the septal segment. Left ventricular diastolic parameters are indeterminate. Right Ventricle: The right ventricular size is normal. No increase in right ventricular wall thickness. Right ventricular systolic function is normal. There is moderately elevated pulmonary artery systolic pressure. The tricuspid regurgitant velocity is 3.34 m/s, and with an assumed right atrial pressure of 3 mmHg, the estimated right ventricular systolic pressure is 47.6 mmHg. Left Atrium: Left atrial size was moderately dilated. Right Atrium: Right atrial size was normal in size. Pericardium: There is no evidence of pericardial effusion. Mitral Valve: The mitral valve has been repaired/replaced. Moderate mitral valve regurgitation. There is a Mitra-Clip present in the mitral position. Mild mitral valve stenosis. MV peak gradient, 13.1 mmHg. The mean mitral valve gradient is 6.0 mmHg. Tricuspid Valve: The tricuspid valve is normal in structure. Tricuspid valve regurgitation is mild . No evidence of tricuspid stenosis. Aortic Valve: The aortic valve is normal in structure. Aortic valve regurgitation is not visualized. No aortic stenosis is present. Aortic valve mean gradient measures 3.0 mmHg. Aortic valve peak gradient measures 5.2 mmHg. Aortic valve area, by VTI measures 4.39 cm. Pulmonic Valve: The pulmonic valve was normal in structure. Pulmonic valve regurgitation is trivial. No evidence of pulmonic stenosis. Aorta: The aortic root is normal in size and structure. Pulmonary Artery: The pulmonary artery is moderately dilated. Venous: The inferior vena cava is normal in size with greater than 50% respiratory variability, suggesting right atrial pressure of 3 mmHg. IAS/Shunts: No atrial level shunt detected by color flow Doppler.  LEFT VENTRICLE PLAX 2D LVIDd:  5.20 cm   Diastology LVIDs:         2.40 cm   LV e' medial:    4.79 cm/s LV PW:         1.10 cm   LV E/e' medial:  34.0 LV IVS:        1.70 cm   LV e'  lateral:   6.64 cm/s LVOT diam:     2.55 cm   LV E/e' lateral: 24.5 LV SV:         76 LV SV Index:   48 LVOT Area:     5.11 cm  RIGHT VENTRICLE RV Basal diam:  2.50 cm RV Mid diam:    3.20 cm RV S prime:     11.30 cm/s TAPSE (M-mode): 1.6 cm LEFT ATRIUM             Index        RIGHT ATRIUM           Index LA diam:        4.40 cm 2.80 cm/m   RA Area:     13.90 cm LA Vol (A2C):   61.1 ml 38.94 ml/m  RA Volume:   36.10 ml  23.01 ml/m LA Vol (A4C):   64.8 ml 41.30 ml/m LA Biplane Vol: 64.3 ml 40.98 ml/m  AORTIC VALVE AV Area (Vmax):    3.36 cm AV Area (Vmean):   3.85 cm AV Area (VTI):     4.39 cm AV Vmax:           114.00 cm/s AV Vmean:          72.400 cm/s AV VTI:            0.172 m AV Peak Grad:      5.2 mmHg AV Mean Grad:      3.0 mmHg LVOT Vmax:         75.00 cm/s LVOT Vmean:        54.600 cm/s LVOT VTI:          0.148 m LVOT/AV VTI ratio: 0.86  AORTA Ao Root diam: 3.30 cm MITRAL VALVE                TRICUSPID VALVE MV Area (PHT): 1.65 cm     TR Peak grad:   44.6 mmHg MV Area VTI:   1.24 cm     TR Vmax:        334.00 cm/s MV Peak grad:  13.1 mmHg MV Mean grad:  6.0 mmHg     SHUNTS MV Vmax:       1.81 m/s     Systemic VTI:  0.15 m MV Vmean:      111.0 cm/s   Systemic Diam: 2.55 cm MV Decel Time: 460 msec MV E velocity: 163.00 cm/s MV A velocity: 88.60 cm/s MV E/A ratio:  1.84 Lorine Bears MD Electronically signed by Lorine Bears MD Signature Date/Time: 02/10/2024/9:07:50 AM    Final    DG Chest 1 View Result Date: 02/09/2024 CLINICAL DATA:  Larey Seat, pain EXAM: CHEST  1 VIEW COMPARISON:  09/04/2023 FINDINGS: Single frontal view of the chest demonstrates stable enlargement of the cardiac silhouette. Postsurgical changes are seen from mitral valve repair. There is increased consolidation within the medial right upper lung zone since prior study, consistent with airspace disease superimposed upon chronic atelectasis and bronchiectasis seen on prior CTs. Increased pulmonary vascular congestion. No effusion  or pneumothorax. No acute bony abnormalities. IMPRESSION: 1. Enlarged cardiac silhouette.  2. Pulmonary vascular congestion. Increased consolidation within the medial right upper lung zone may reflect airspace disease superimposed upon known chronic scarring and bronchiectasis. Electronically Signed   By: Sharlet Salina M.D.   On: 02/09/2024 17:44   DG Hip Unilat W or Wo Pelvis 2-3 Views Right Result Date: 02/09/2024 CLINICAL DATA:  Right hip pain EXAM: DG HIP (WITH OR WITHOUT PELVIS) 2-3V RIGHT COMPARISON:  09/22/2022 FINDINGS: Acute, mildly comminuted intertrochanteric fracture of the proximal right femur with mild displacement and varus angulation. Hip joint alignment is maintained without dislocation. Prior left hip arthroplasty. Bony pelvis intact without evidence of fracture or diastasis. Soft tissue swelling overlies the right hip laterally. IMPRESSION: Acute, mildly comminuted intertrochanteric fracture of the proximal right femur. Electronically Signed   By: Duanne Guess D.O.   On: 02/09/2024 17:43   DG Wrist Complete Right Result Date: 02/09/2024 CLINICAL DATA:  Larey Seat, right wrist pain EXAM: RIGHT WRIST - COMPLETE 3+ VIEW COMPARISON:  None Available. FINDINGS: Frontal, oblique, lateral, and ulnar deviated views of the right wrist are obtained. There is an impacted extra-articular fracture through the distal right radial metaphysis, with mild impaction at the fracture site. Anatomic alignment of the radiocarpal joint. Prominent osteoarthritis within the radial aspect of the carpus and at the first carpometacarpal joint. Mild soft tissue swelling. IMPRESSION: 1. Impacted extra-articular fracture of the distal radial metaphysis, with near anatomic alignment. 2. 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 3. Soft tissue swelling. Electronically Signed   By: Sharlet Salina M.D.   On: 02/09/2024 17:41    Assessment/Plan Skin tear  Severe protein  Deficiency Shearing of skin on gluteal folds likely due to cleaning and patient's increased risk of skin breakdown due to immobility and poor PO intake. Healing is anticipated within one to two weeks with appropriate offloading and dressing changes. Significant pain is associated with the wound, and keeping area dry is crucial for healing. - Offload pressure from the sacral area using pillows and positioning - Use an air mattress to reduce pressure - Apply barrier cream to the affected area - Change Proximil dressing every three days or when soiled  Pain Management Pain is not well controlled, especially after movement. She is on a regular pain medication schedule, but adjustments may be needed to improve pain control. Pain management is crucial for participation in therapy. - Adjust pain medication schedule to every six hours - Consider additional pain management strategies if current regimen is insufficient  Postoperative Edema Bilateral lower extremity edema, more pronounced on the right side due to recent surgery and immobility. Fluid retention is likely exacerbated by postoperative inflammation and fluid administration during surgery. - Encourage mobility as tolerated - Adjust fluid intake as necessary  Urinary Tract Infection (UTI) Burning sensation during urination started today, suggestive of a urinary tract infection. She has UTIs and recognizes the symptoms. Recent antibiotic use is uncertain, which could affect treatment options. - Consider urinalysis to confirm UTI - Evaluate the need for antibiotics based on urinalysis results  Goals of Care She is uncertain about continuing therapy and is considering comfort measures if significant improvement is not observed in the coming weeks. The importance of the initial weeks of therapy for recovery was discussed. She expressed a preference for comfort and pain management over aggressive therapy if progress is not seen. - Continue to  support therapy efforts - Reassess therapy progress in a few weeks - Discuss potential transition to comfort measures if therapy  is not beneficial  Labs/tests ordered:  * No order type specified * Next appt:  Visit date not found

## 2024-02-29 MED ORDER — HYDROCODONE-ACETAMINOPHEN 5-325 MG PO TABS: 1.0000 | ORAL_TABLET | Freq: Four times a day (QID) | ORAL | 0 refills | Status: DC

## 2024-03-02 ENCOUNTER — Encounter: Payer: Self-pay | Admitting: Student

## 2024-03-02 ENCOUNTER — Non-Acute Institutional Stay (SKILLED_NURSING_FACILITY): Payer: Self-pay | Admitting: Student

## 2024-03-02 DIAGNOSIS — I4891 Unspecified atrial fibrillation: Secondary | ICD-10-CM

## 2024-03-02 DIAGNOSIS — I5033 Acute on chronic diastolic (congestive) heart failure: Secondary | ICD-10-CM | POA: Diagnosis not present

## 2024-03-02 DIAGNOSIS — J441 Chronic obstructive pulmonary disease with (acute) exacerbation: Secondary | ICD-10-CM

## 2024-03-02 DIAGNOSIS — I7091 Generalized atherosclerosis: Secondary | ICD-10-CM | POA: Diagnosis not present

## 2024-03-02 DIAGNOSIS — R0902 Hypoxemia: Secondary | ICD-10-CM | POA: Diagnosis not present

## 2024-03-02 NOTE — Progress Notes (Signed)
 Location:  Other Nursing Home Room Number: [facility] [address] A Place of Service:  SNF [phone]) Provider:  Ander Gaster, Benetta Spar, MD  Patient Care Team: Earnestine Mealing, MD as PCP - General (Family Medicine) Iran Ouch, MD as PCP - Cardiology (Cardiology) Sidney Ace, MD as Referring Physician (Allergy) Jerene Bears, MD as Consulting Physician (Gynecology) Tat, Octaviano Batty, DO as Consulting Physician (Neurology) Serena Croissant, MD as Consulting Physician (Hematology and Oncology) Tat, Octaviano Batty, DO as Consulting Physician (Neurology) Kathyrn Sheriff, [facility] (Inactive) as Pharmacist (Pharmacist)  Extended Emergency Contact Information Primary Emergency Contact: Elson Areas [place], [place] [address] [address] Home Phone: [phone] Mobile Phone: [phone] Relation: Brother Secondary Emergency Contact: [facility] Phone: [phone] Mobile Phone: [phone] Relation: Niece Preferred language: English Interpreter needed? No  Code Status:  DNR Goals of care: Advanced Directive information    02/21/2024   10:07 AM  Advanced Directives  Does Patient Have a Medical Advance Directive? Yes  Type of Estate agent of [place];Out of facility DNR (pink MOST or yellow form);Living will  Does patient want to make changes to medical advance directive? No - Patient declined  Copy of Healthcare Power of Attorney in Chart? Yes - validated most recent copy scanned in chart (See row information)  Pre-existing out of facility DNR order (yellow form or pink MOST form) Yellow form placed in chart (order not valid for inpatient use);Pink MOST form placed in chart (order not valid for inpatient use)     Chief Complaint  Patient presents with   Acute Visit    hypoxia   COPD    HPI:  Pt is a 83 y.o. female seen today for an acute visit.  Discussed the use of AI scribe software for clinical note transcription with the patient,  who gave verbal consent to proceed.  History of Present Illness   History of Present Illness The patient, with atrial fibrillation, presents with shortness of breath and increased sputum production.  Shortness of breath began over the weekend, around two or three in the morning. Oxygen therapy has been initiated, and her oxygen saturation has decreased from 89% to 87%. No chest pain has been reported recently.  She is coughing up greenish-yellow sputum, which started on Saturday with a significant amount and has since decreased, though it remains more than usual. No fevers have been experienced.  She has a history of atrial fibrillation and has noticed her heart 'beating fast'.  She has been experiencing low blood pressure, which may be related to diuretic use.  She has not worked with therapy today due to her breathing difficulties. Previous burning with urination noted on Friday has improved.   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 10/2021 Cath: Nl cors.   Arthritis    Asthma    Breast cancer (HCC) 2002   infiltrative ductal carcinoma    Cataract 2019   Chronic heart failure with preserved ejection fraction (HFpEF) (HCC)    COPD (chronic obstructive pulmonary disease) (HCC) 07/2007   Coronary artery disease    Ductal carcinoma of breast, estrogen receptor positive, stage 1 (HCC) 09/16/2012   Endometriosis    GERD (gastroesophageal reflux disease)    Hx of mitral valve repair    Hypercalcemia 09/14/2013   Hypertension  Lesion of breast 1992   right, benign   Lung disease    secondary to [name] infection   Mastalgia 05/1994   Mitral regurgitation    a. 10/2021 RHC: PCWP V wave of 34; b. 01/2022 TEE: Severe flail/prolapse of large area of A2 scallop with severe posteriorly directed MR; c. 01/2022 TEER w /Mitraclip XTW x 2; d. 07/2022 Echo: EF 60-65%. No rwma, mild LVH, GrIII DD. Sev  dil LA. Mod elev PASP. Mod-Sev MR. Mod MS.   Moderate mitral regurgitation by prior echocardiogram    Osteoporosis, post-menopausal 03/14/2012   Ovarian tumor of borderline malignancy, right 2004   Palpitations    a. 02/2022 Zio: Predominantly sinus rhythm at 67 (52-171).  2 runs of NSVT -fastest 9 beats at 167.  35 SVT runs-longest 19 beats, fastest 171x6 beats.   Pancreatitis    secondary to Cholelithiasis   Parkinson disease (HCC) 02/10/2015   Post-thoracotomy pain syndrome    Pseudomonas pneumonia (HCC) 03/2008   Status post implantation of mitral valve leaflet clip 02/15/2022   s/p XTW MitraClip x2 by Dr. Excell Seltzer in the A2/P2: b. 04/03/23 s/p third mitraclip implantation.   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   APPENDECTOMY  2004   BREAST BIOPSY  08/2004   right breast-benign   BREAST LUMPECTOMY  1992   benign   BUBBLE STUDY  12/27/2021   Procedure: BUBBLE STUDY;  Surgeon: Sande Rives, MD;  Location: [facility] ENDOSCOPY;  Service: Cardiovascular;;   CARDIAC CATHETERIZATION  2007   essentially negation for significant CAD   CARDIOVERSION N/A 09/05/2023   Procedure: CARDIOVERSION;  Surgeon: Debbe Odea, MD;  Location: [facility] ORS;  Service: Cardiovascular;  Laterality: N/A;   CATARACT EXTRACTION W/PHACO Left 07/24/2023   Procedure: CATARACT EXTRACTION PHACO AND INTRAOCULAR LENS PLACEMENT (IOC) LEFT 5.34 0037.3;  Surgeon: Lockie Mola, MD;  Location: [facility] SURGERY CNTR;  Service: Ophthalmology;  Laterality: Left;   CATARACT EXTRACTION W/PHACO Right 08/07/2023   Procedure: CATARACT EXTRACTION PHACO AND INTRAOCULAR LENS PLACEMENT (IOC) RIGHT 7.83 00:47.9;  Surgeon: Lockie Mola, MD;  Location: [facility] SURGERY  CNTR;  Service: Ophthalmology;  Laterality: Right;   CHOLECYSTECTOMY  2001   COLONOSCOPY  2014   Dr Marina Goodell , due 2019   COLONOSCOPY  09/2018   2 TA removed, int hem, no f/u needed (Dr Marina Goodell)    ESOPHAGOGASTRODUODENOSCOPY  09/2018   GERD with esophagitis and stricture dilated, antral gastritis negative for H pylori Marina Goodell)   HIP ARTHROPLASTY Left 08/21/2022   Procedure: ARTHROPLASTY BIPOLAR HIP (HEMIARTHROPLASTY);  Surgeon: Juanell Fairly, MD;  Location: [facility] ORS;  Service: Orthopedics;  Laterality: Left;   INTRAMEDULLARY (IM) NAIL INTERTROCHANTERIC Right 02/10/2024   Procedure: FIXATION, FRACTURE, INTERTROCHANTERIC, WITH INTRAMEDULLARY ROD;  Surgeon: Christena Flake, MD;  Location: [facility] ORS;  Service: Orthopedics;  Laterality: Right;   LUNG BIOPSY  2002   [name], Dr Edwyna Shell   MASTECTOMY MODIFIED RADICAL Left 2002   oral chemotheraphy (tamoxifen then Armidex) no radiation, Dr.[name]   RIGHT/LEFT HEART CATH AND CORONARY ANGIOGRAPHY Bilateral 11/14/2021   Procedure: RIGHT/LEFT HEART CATH AND CORONARY ANGIOGRAPHY;  Surgeon: Iran Ouch, MD;  Location: [facility] INVASIVE CV LAB;  Service: Cardiovascular;  Laterality: Bilateral;   TEE WITHOUT CARDIOVERSION N/A  12/29/2018   Procedure: TRANSESOPHAGEAL ECHOCARDIOGRAM (TEE);  Surgeon: Iran Ouch, MD;  Location: [facility] ORS;  Service: Cardiovascular;  Laterality: N/A;   TEE WITHOUT CARDIOVERSION N/A 12/27/2021   Procedure: TRANSESOPHAGEAL ECHOCARDIOGRAM (TEE);  Surgeon: Sande Rives, MD;  Location: [facility] ENDOSCOPY;  Service: Cardiovascular;  Laterality: N/A;   TEE WITHOUT CARDIOVERSION N/A 02/15/2022   Procedure: TRANSESOPHAGEAL ECHOCARDIOGRAM (TEE);  Surgeon: Tonny Bollman, MD;  Location: [facility] INVASIVE CV LAB;  Service: Cardiovascular;  Laterality: N/A;   TEE WITHOUT CARDIOVERSION N/A 05/14/2022   Procedure: TRANSESOPHAGEAL ECHOCARDIOGRAM (TEE);  Surgeon: Sande Rives, MD;  Location: [facility] ENDOSCOPY;  Service: Cardiovascular;  Laterality:  N/A;   TEE WITHOUT CARDIOVERSION N/A 04/03/2023   Procedure: TRANSESOPHAGEAL ECHOCARDIOGRAM;  Surgeon: Tonny Bollman, MD;  Location: [facility] INVASIVE CV LAB;  Service: Cardiovascular;  Laterality: N/A;   TEE WITHOUT CARDIOVERSION N/A 09/05/2023   Procedure: TRANSESOPHAGEAL ECHOCARDIOGRAM;  Surgeon: Debbe Odea, MD;  Location: [facility] ORS;  Service: Cardiovascular;  Laterality: N/A;   TONSILLECTOMY  1946   TRANSCATHETER MITRAL EDGE TO EDGE REPAIR N/A 02/15/2022   Procedure: MITRAL VALVE REPAIR;  Surgeon: Tonny Bollman, MD;  Location: [facility] INVASIVE CV LAB;  Service: Cardiovascular;  Laterality: N/A;   TRANSCATHETER MITRAL EDGE TO EDGE REPAIR N/A 04/03/2023   Procedure: MITRAL VALVE REPAIR;  Surgeon: Tonny Bollman, MD;  Location: [facility] INVASIVE CV LAB;  Service: Cardiovascular;  Laterality: N/A;    Allergies  Allergen Reactions   Sulfa Antibiotics Anaphylaxis   Sulfonamide Derivatives Anaphylaxis   Topamax [Topiramate] Other (See Comments)    Metabolic acidosis    Clarithromycin Other (See Comments)    pericarditis  Other reaction(s): Not available   Hydrocodone Other (See Comments)    "hyper and climbing the walls"   Motrin [Ibuprofen] Other (See Comments)    headaches   Misc. Sulfonamide Containing Compounds     Other reaction(s): Not available   Symbicort [Budesonide-Formoterol Fumarate] Other (See Comments)    02/07/15 tremor   Tetracycline Hcl Other (See Comments)   Percocet [Oxycodone-Acetaminophen] Itching and Rash   Tape Itching and Rash    Use paper tape only   Tetracyclines & Related Other (See Comments)    "immediate yeast infection"    Outpatient Encounter Medications as of 03/02/2024  Medication Sig   acetaminophen (TYLENOL) 500 MG tablet Take 1,000 mg by mouth 3 (three) times daily.   albuterol (VENTOLIN HFA) 108 (90 Base) MCG/ACT inhaler Inhale 2 puffs into the lungs every 6 (six) hours as needed for wheezing or shortness of breath.   amiodarone (PACERONE) 100 MG tablet  Take 100 mg by mouth daily.   apixaban (ELIQUIS) 2.5 MG TABS tablet Take 1 tablet (2.5 mg total) by mouth 2 (two) times daily.   bisacodyl (DULCOLAX) 10 MG suppository Place 10 mg rectally as needed for moderate constipation.   calcium carbonate (OSCAL) 1500 (600 Ca) MG TABS tablet Take 600 mg of elemental calcium by mouth daily.   calcium carbonate (TUMS - DOSED IN MG ELEMENTAL CALCIUM) 500 MG chewable tablet Chew 2 tablets by mouth daily as needed for indigestion or heartburn.   Carbidopa-Levodopa ER (SINEMET CR) 25-100 MG tablet controlled release Take 2 tablets by mouth 2 (two) times daily.   Carbidopa-Levodopa ER (SINEMET CR) 25-100 MG tablet controlled release Take 1 tablet by mouth daily before supper.   cetirizine (ZYRTEC) 10 MG tablet Take 10 mg by mouth daily.   Cholecalciferol (VITAMIN D3) 25 MCG (1000 UT) CAPS Take 1 capsule (1,000 Units total) by mouth  daily.   docusate sodium (COLACE) 100 MG capsule Take 100 mg by mouth 2 (two) times daily.   EPINEPHrine 0.3 mg/0.3 mL IJ SOAJ injection Inject 0.3 mg into the muscle as needed for anaphylaxis.   fexofenadine (ALLEGRA) 30 MG/5ML suspension Take 30 mg by mouth 2 (two) times daily.   fluticasone (FLONASE) 50 MCG/ACT nasal spray Place 2 sprays into both nostrils daily as needed.   Fluticasone-Umeclidin-Vilant (TRELEGY ELLIPTA) 100-62.5-25 MCG/ACT AEPB Inhale 1 puff into the lungs daily.   furosemide (LASIX) 40 MG tablet TAKE ONE TABLET BY MOUTH ONCE DAILY *NEW DOSE*   gabapentin ([name]) 300 MG capsule Take 300 mg by mouth 2 (two) times daily. For neuropathic pain. Give one Capsule by mouth once daily.   HYDROcodone-acetaminophen (NORCO/VICODIN) 5-325 MG tablet Take 1 tablet by mouth every 6 (six) hours.   methocarbamol (ROBAXIN) 500 MG tablet Take 1 tablet (500 mg total) by mouth every 8 (eight) hours as needed for muscle spasms.   Multiple Vitamin (MULTIVITAMIN WITH MINERALS) TABS tablet Take 1 tablet by mouth daily.   nitroGLYCERIN  (NITROSTAT) 0.4 MG SL tablet Place 0.4 mg under the tongue every 5 (five) minutes as needed for chest pain.   pantoprazole (PROTONIX) 20 MG tablet Take 1 tablet (20 mg total) by mouth daily.   polyethylene glycol (MIRALAX / GLYCOLAX) 17 g packet Take 17 g by mouth daily as needed.   No facility-administered encounter medications on file as of 03/02/2024.    Review of Systems  Immunization History  Administered Date(s) Administered   Fluad Quad(high Dose 65+) 08/11/2019, 08/03/2020, 08/08/2021, 09/06/2022   Influenza Split 09/11/2011   Influenza Whole 09/09/2007, 08/08/2008, 08/15/2010, 08/26/2012   Influenza,inj,Quad PF,6+ Mos 08/18/2013, 08/20/2014, 08/25/2018   Influenza-Unspecified 09/27/2015, 08/26/2017, 09/18/2023   Moderna Covid-19 Fall Seasonal Vaccine 89yrs & older 10/25/2022   Moderna Covid-19 Vaccine Bivalent Booster 82yrs & up 04/24/2022, 03/05/2023   Moderna Sars-Covid-2 Vaccination 01/01/2020, 01/20/2020, 02/16/2020, 07/25/2020, 11/22/2020   PPD Test 08/24/2022   Pneumococcal Conjugate-13 02/12/2014   Pneumococcal Polysaccharide-23 11/26/2006, 10/21/2012, 11/22/2020, 11/21/2021   Rsv, Bivalent, Protein Subunit Rsvpref,pf Verdis Frederickson) 10/25/2022   Td 01/01/2011, 06/30/2021   Zoster Recombinant(Shingrix) 02/22/2018, 07/08/2019   Zoster, Live 05/26/2007   Pertinent  Health Maintenance Due  Topic Date Due   INFLUENZA VACCINE  06/26/2024   DEXA SCAN  Completed   Colonoscopy  Discontinued      11/26/2022    9:30 AM 12/07/2022   11:43 AM 01/29/2023   10:32 AM 08/01/2023   10:29 AM 02/04/2024   12:45 PM  Fall Risk  Falls in the past year?  1 1 0 0  Was there an injury with Fall?  1 1 0 0  Fall Risk Category Calculator  3 3 0 0  Fall Risk Category (Retired)  High     (RETIRED) Patient Fall Risk Level High fall risk      Fall risk Follow up    Falls evaluation completed Falls evaluation completed   Functional Status Survey:    Vitals:   03/02/24 2205  BP: 97/63  Pulse: 75   Resp: 18  Temp: (!) 97.5 F (36.4 C)  SpO2: 93%  Weight: 125 lb (56.7 kg)   Body mass index is 22.86 kg/m. Physical Exam Cardiovascular:     Rate and Rhythm: Normal rate.  Pulmonary:     Effort: Respiratory distress present.     Breath sounds: Rhonchi present.     Comments: No supplemental oxygen at this time Skin:  General: Skin is warm.  Neurological:     Mental Status: She is alert.     Labs reviewed: Recent Labs    09/04/23 1057 09/05/23 0521 02/10/24 0855 02/11/24 0354 02/12/24 0150 02/24/24 0000  NA  --    < > 141 136 135 136*  K  --    < > 4.3 4.2 4.2 3.6  CL  --    < > 102 98 100 99  CO2  --    < > 27 29 31  30*  GLUCOSE  --    < > 119* 143* 105*  --   BUN  --    < > 22 31* 40* 14  CREATININE  --    < > 0.81 1.14* 1.00 0.6  CALCIUM  --    < > 9.1 9.0 8.7* 8.0*  MG 2.1  --   --   --   --   --    < > = values in this interval not displayed.   Recent Labs    04/01/23 0816 08/05/23 1114 09/05/23 0521  AST 20 13 21   ALT 5 2 14   ALKPHOS 67 98 80  BILITOT 2.0* 1.0 1.5*  PROT 6.9 7.6 6.7  ALBUMIN 3.8 3.7 3.4*   Recent Labs    09/08/23 0417 09/26/23 0000 02/09/24 1654 02/10/24 0855 02/11/24 0354 02/11/24 1335 02/12/24 0150 02/13/24 0550 02/24/24 0000  WBC 6.0   < > 6.5   < > 9.4  --  10.2 9.0 9.8  NEUTROABS 4.3  --  4.3  --   --   --   --   --  8,261.00  HGB 12.7   < > 12.9   < > 9.3*   < > 8.1* 8.4* 7.7*  HCT 38.9   < > 39.8   < > 28.7*  --  25.1* 25.7* 25*  MCV 99.0  --  99.5   < > 99.3  --  100.8* 99.6  --   PLT 162   < > 158   < > 109*  --  98* 114* 342   < > = values in this interval not displayed.   Lab Results  Component Value Date   TSH 1.145 09/07/2023   Lab Results  Component Value Date   HGBA1C 5.3 01/14/2018   Lab Results  Component Value Date   CHOL 171 08/08/2021   HDL 53.30 08/08/2021   LDLCALC 97 08/08/2021   LDLDIRECT 135.0 08/16/2009   TRIG 100.0 08/08/2021   CHOLHDL 3 08/08/2021    Significant Diagnostic  Results in last 30 days:  DG HIP UNILAT WITH PELVIS 2-3 VIEWS RIGHT Result Date: 02/10/2024 CLINICAL DATA:  Intraoperative fluoroscopy 4 dynamic hip screw, ORIF EXAM: Intraoperative fluoroscopy COMPARISON:  Preop x-ray 02/09/2024. FINDINGS: Four fluoroscopic spot images submitted for review demonstrate placement dynamic hip screw transfixing the proximal intratrochanteric fracture. Long intramedullary rod. Imaging was obtained to aid in treatment. Expected alignment. Please correlate with real-time fluoroscopy of 1 minute and 13 seconds. Cumulative dose 10.09 mGy IMPRESSION: Intraoperative fluoroscopy Electronically Signed   By: Karen Kays M.D.   On: 02/10/2024 15:38   DG C-Arm 1-60 Min-No Report Result Date: 02/10/2024 Fluoroscopy was utilized by the requesting physician.  No radiographic interpretation.   DG C-Arm 1-60 Min-No Report Result Date: 02/10/2024 Fluoroscopy was utilized by the requesting physician.  No radiographic interpretation.   ECHOCARDIOGRAM COMPLETE Result Date: 02/10/2024    ECHOCARDIOGRAM REPORT   Patient Name:   Crystal  H Bass Date of Exam: 02/10/2024 Medical Rec #:  [id number]         Height:       62.0 in Accession #:    [id number]        Weight:       125.7 lb Date of Birth:  1941/06/02         BSA:          1.569 m Patient Age:    82 years          BP:           119/59 mmHg Patient Gender: F                 HR:           61 bpm. Exam Location:  [facility] Procedure: 2D Echo, Cardiac Doppler and Color Doppler (Both Spectral and Color            Flow Doppler were utilized during procedure). Indications:     Mitral valve disorder I05.9  History:         Patient has prior history of Echocardiogram examinations, most                  recent 09/05/2023. COPD; Risk Factors:Hypertension. History of                  mitral valve repair.                   Mitral Valve: Mitra-Clip valve is present in the mitral                  position.  Sonographer:     Cristela Blue Referring Phys:  [id number]  CADENCE H FURTH Diagnosing Phys: Lorine Bears MD IMPRESSIONS  1. Left ventricular ejection fraction, by estimation, is 70 to 75%. The left ventricle has hyperdynamic function. The left ventricle has no regional wall motion abnormalities. There is moderate asymmetric left ventricular hypertrophy of the septal segment. Left ventricular diastolic parameters are indeterminate.  2. Right ventricular systolic function is normal. The right ventricular size is normal. There is moderately elevated pulmonary artery systolic pressure.  3. Left atrial size was moderately dilated.  4. The mitral valve has been repaired/replaced. Moderate mitral valve regurgitation. Mild mitral stenosis. The mean mitral valve gradient is 6.0 mmHg. There is a Mitra-Clip present in the mitral position.  5. The aortic valve is normal in structure. Aortic valve regurgitation is not visualized. No aortic stenosis is present.  6. Moderately dilated pulmonary artery.  7. The inferior vena cava is normal in size with greater than 50% respiratory variability, suggesting right atrial pressure of 3 mmHg. FINDINGS  Left Ventricle: Left ventricular ejection fraction, by estimation, is 70 to 75%. The left ventricle has hyperdynamic function. The left ventricle has no regional wall motion abnormalities. The left ventricular internal cavity size was normal in size. There is moderate asymmetric left ventricular hypertrophy of the septal segment. Left ventricular diastolic parameters are indeterminate. Right Ventricle: The right ventricular size is normal. No increase in right ventricular wall thickness. Right ventricular systolic function is normal. There is moderately elevated pulmonary artery systolic pressure. The tricuspid regurgitant velocity is 3.34 m/s, and with an assumed right atrial pressure of 3 mmHg, the estimated right ventricular systolic pressure is 47.6 mmHg. Left Atrium: Left atrial size was moderately dilated. Right Atrium: Right atrial size  was normal in size. Pericardium: There is no evidence of pericardial effusion. Mitral  Valve: The mitral valve has been repaired/replaced. Moderate mitral valve regurgitation. There is a Mitra-Clip present in the mitral position. Mild mitral valve stenosis. MV peak gradient, 13.1 mmHg. The mean mitral valve gradient is 6.0 mmHg. Tricuspid Valve: The tricuspid valve is normal in structure. Tricuspid valve regurgitation is mild . No evidence of tricuspid stenosis. Aortic Valve: The aortic valve is normal in structure. Aortic valve regurgitation is not visualized. No aortic stenosis is present. Aortic valve mean gradient measures 3.0 mmHg. Aortic valve peak gradient measures 5.2 mmHg. Aortic valve area, by VTI measures 4.39 cm. Pulmonic Valve: The pulmonic valve was normal in structure. Pulmonic valve regurgitation is trivial. No evidence of pulmonic stenosis. Aorta: The aortic root is normal in size and structure. Pulmonary Artery: The pulmonary artery is moderately dilated. Venous: The inferior vena cava is normal in size with greater than 50% respiratory variability, suggesting right atrial pressure of 3 mmHg. IAS/Shunts: No atrial level shunt detected by color flow Doppler.  LEFT VENTRICLE PLAX 2D LVIDd:         5.20 cm   Diastology LVIDs:         2.40 cm   LV e' medial:    4.79 cm/s LV PW:         1.10 cm   LV E/e' medial:  34.0 LV IVS:        1.70 cm   LV e' lateral:   6.64 cm/s LVOT diam:     2.55 cm   LV E/e' lateral: 24.5 LV SV:         76 LV SV Index:   48 LVOT Area:     5.11 cm  RIGHT VENTRICLE RV Basal diam:  2.50 cm RV Mid diam:    3.20 cm RV S prime:     11.30 cm/s TAPSE (M-mode): 1.6 cm LEFT ATRIUM             Index        RIGHT ATRIUM           Index LA diam:        4.40 cm 2.80 cm/m   RA Area:     13.90 cm LA Vol (A2C):   61.1 ml 38.94 ml/m  RA Volume:   36.10 ml  23.01 ml/m LA Vol (A4C):   64.8 ml 41.30 ml/m LA Biplane Vol: 64.3 ml 40.98 ml/m  AORTIC VALVE AV Area (Vmax):    3.36 cm AV Area  (Vmean):   3.85 cm AV Area (VTI):     4.39 cm AV Vmax:           114.00 cm/s AV Vmean:          72.400 cm/s AV VTI:            0.172 m AV Peak Grad:      5.2 mmHg AV Mean Grad:      3.0 mmHg LVOT Vmax:         75.00 cm/s LVOT Vmean:        54.600 cm/s LVOT VTI:          0.148 m LVOT/AV VTI ratio: 0.86  AORTA Ao Root diam: 3.30 cm MITRAL VALVE                TRICUSPID VALVE MV Area (PHT): 1.65 cm     TR Peak grad:   44.6 mmHg MV Area VTI:   1.24 cm     TR Vmax:  334.00 cm/s MV Peak grad:  13.1 mmHg MV Mean grad:  6.0 mmHg     SHUNTS MV Vmax:       1.81 m/s     Systemic VTI:  0.15 m MV Vmean:      111.0 cm/s   Systemic Diam: 2.55 cm MV Decel Time: 460 msec MV E velocity: 163.00 cm/s MV A velocity: 88.60 cm/s MV E/A ratio:  1.84 Lorine Bears MD Electronically signed by Lorine Bears MD Signature Date/Time: 02/10/2024/9:07:50 AM    Final    DG Chest 1 View Result Date: 02/09/2024 CLINICAL DATA:  Larey Seat, pain EXAM: CHEST  1 VIEW COMPARISON:  09/04/2023 FINDINGS: Single frontal view of the chest demonstrates stable enlargement of the cardiac silhouette. Postsurgical changes are seen from mitral valve repair. There is increased consolidation within the medial right upper lung zone since prior study, consistent with airspace disease superimposed upon chronic atelectasis and bronchiectasis seen on prior CTs. Increased pulmonary vascular congestion. No effusion or pneumothorax. No acute bony abnormalities. IMPRESSION: 1. Enlarged cardiac silhouette. 2. Pulmonary vascular congestion. Increased consolidation within the medial right upper lung zone may reflect airspace disease superimposed upon known chronic scarring and bronchiectasis. Electronically Signed   By: Sharlet Salina M.D.   On: 02/09/2024 17:44   DG Hip Unilat W or Wo Pelvis 2-3 Views Right Result Date: 02/09/2024 CLINICAL DATA:  Right hip pain EXAM: DG HIP (WITH OR WITHOUT PELVIS) 2-3V RIGHT COMPARISON:  09/22/2022 FINDINGS: Acute, mildly comminuted  intertrochanteric fracture of the proximal right femur with mild displacement and varus angulation. Hip joint alignment is maintained without dislocation. Prior left hip arthroplasty. Bony pelvis intact without evidence of fracture or diastasis. Soft tissue swelling overlies the right hip laterally. IMPRESSION: Acute, mildly comminuted intertrochanteric fracture of the proximal right femur. Electronically Signed   By: Duanne Guess D.O.   On: 02/09/2024 17:43   DG Wrist Complete Right Result Date: 02/09/2024 CLINICAL DATA:  Larey Seat, right wrist pain EXAM: RIGHT WRIST - COMPLETE 3+ VIEW COMPARISON:  None Available. FINDINGS: Frontal, oblique, lateral, and ulnar deviated views of the right wrist are obtained. There is an impacted extra-articular fracture through the distal right radial metaphysis, with mild impaction at the fracture site. Anatomic alignment of the radiocarpal joint. Prominent osteoarthritis within the radial aspect of the carpus and at the first carpometacarpal joint. Mild soft tissue swelling. IMPRESSION: 1. Impacted extra-articular fracture of the distal radial metaphysis, with near anatomic alignment. 2. 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 3. Soft tissue swelling. Electronically Signed   By: Sharlet Salina M.D.   On: 02/09/2024 17:41    Assessment/Plan COPD exacerbation w/o O2 requirment  Acute exacerbation of COPD with increased dyspnea and production of greenish-yellow sputum since the weekend. No fever reported. Oxygen saturation decreased from 89% to 87%. She meets criteria by increased sputum production, oxygen requirement, chang in color of sputum - Initiate Azithromycin and corticosteroids for COPD exacerbation.  CHF  Atrial Fibrillation Atrial fibrillation with concerns about heart rate. Blood pressure low, possibly due to excessive diuresis. Decision to hold diuretics to prevent further hypotension. - Temporarily  hold diuretics.  Family/ staff Communication: Nursing  Labs/tests ordered:  None

## 2024-03-03 DIAGNOSIS — G20A1 Parkinson's disease without dyskinesia, without mention of fluctuations: Secondary | ICD-10-CM | POA: Diagnosis not present

## 2024-03-03 DIAGNOSIS — R262 Difficulty in walking, not elsewhere classified: Secondary | ICD-10-CM | POA: Diagnosis not present

## 2024-03-03 DIAGNOSIS — S72141D Displaced intertrochanteric fracture of right femur, subsequent encounter for closed fracture with routine healing: Secondary | ICD-10-CM | POA: Diagnosis not present

## 2024-03-03 DIAGNOSIS — I4891 Unspecified atrial fibrillation: Secondary | ICD-10-CM | POA: Diagnosis not present

## 2024-03-03 DIAGNOSIS — I110 Hypertensive heart disease with heart failure: Secondary | ICD-10-CM | POA: Diagnosis not present

## 2024-03-03 DIAGNOSIS — J449 Chronic obstructive pulmonary disease, unspecified: Secondary | ICD-10-CM | POA: Diagnosis not present

## 2024-03-03 DIAGNOSIS — M6281 Muscle weakness (generalized): Secondary | ICD-10-CM | POA: Diagnosis not present

## 2024-03-03 DIAGNOSIS — I5032 Chronic diastolic (congestive) heart failure: Secondary | ICD-10-CM | POA: Diagnosis not present

## 2024-03-03 DIAGNOSIS — S52501D Unspecified fracture of the lower end of right radius, subsequent encounter for closed fracture with routine healing: Secondary | ICD-10-CM | POA: Diagnosis not present

## 2024-03-04 ENCOUNTER — Other Ambulatory Visit: Payer: Self-pay | Admitting: Student

## 2024-03-04 DIAGNOSIS — S52551D Other extraarticular fracture of lower end of right radius, subsequent encounter for closed fracture with routine healing: Secondary | ICD-10-CM | POA: Diagnosis not present

## 2024-03-04 DIAGNOSIS — M6281 Muscle weakness (generalized): Secondary | ICD-10-CM | POA: Diagnosis not present

## 2024-03-04 DIAGNOSIS — I5032 Chronic diastolic (congestive) heart failure: Secondary | ICD-10-CM | POA: Diagnosis not present

## 2024-03-04 DIAGNOSIS — I4891 Unspecified atrial fibrillation: Secondary | ICD-10-CM | POA: Diagnosis not present

## 2024-03-04 DIAGNOSIS — S52501D Unspecified fracture of the lower end of right radius, subsequent encounter for closed fracture with routine healing: Secondary | ICD-10-CM | POA: Diagnosis not present

## 2024-03-04 DIAGNOSIS — R262 Difficulty in walking, not elsewhere classified: Secondary | ICD-10-CM | POA: Diagnosis not present

## 2024-03-04 DIAGNOSIS — I110 Hypertensive heart disease with heart failure: Secondary | ICD-10-CM | POA: Diagnosis not present

## 2024-03-04 DIAGNOSIS — S72141D Displaced intertrochanteric fracture of right femur, subsequent encounter for closed fracture with routine healing: Secondary | ICD-10-CM | POA: Diagnosis not present

## 2024-03-04 DIAGNOSIS — G20A1 Parkinson's disease without dyskinesia, without mention of fluctuations: Secondary | ICD-10-CM | POA: Diagnosis not present

## 2024-03-04 DIAGNOSIS — J449 Chronic obstructive pulmonary disease, unspecified: Secondary | ICD-10-CM | POA: Diagnosis not present

## 2024-03-04 DIAGNOSIS — S72002D Fracture of unspecified part of neck of left femur, subsequent encounter for closed fracture with routine healing: Secondary | ICD-10-CM

## 2024-03-04 MED ORDER — HYDROCODONE-ACETAMINOPHEN 5-325 MG PO TABS: 1.0000 | ORAL_TABLET | Freq: Four times a day (QID) | ORAL | 0 refills | Status: DC

## 2024-03-04 NOTE — Progress Notes (Signed)
Refill of pain medication

## 2024-03-05 ENCOUNTER — Observation Stay

## 2024-03-05 ENCOUNTER — Other Ambulatory Visit: Payer: Self-pay

## 2024-03-05 ENCOUNTER — Emergency Department

## 2024-03-05 ENCOUNTER — Inpatient Hospital Stay
Admission: EM | Admit: 2024-03-05 | Discharge: 2024-03-26 | DRG: 951 | Disposition: E | Attending: Obstetrics and Gynecology | Admitting: Obstetrics and Gynecology

## 2024-03-05 DIAGNOSIS — R918 Other nonspecific abnormal finding of lung field: Secondary | ICD-10-CM | POA: Diagnosis not present

## 2024-03-05 DIAGNOSIS — R000 Tachycardia, unspecified: Secondary | ICD-10-CM | POA: Diagnosis not present

## 2024-03-05 DIAGNOSIS — Z853 Personal history of malignant neoplasm of breast: Secondary | ICD-10-CM

## 2024-03-05 DIAGNOSIS — J449 Chronic obstructive pulmonary disease, unspecified: Secondary | ICD-10-CM | POA: Diagnosis not present

## 2024-03-05 DIAGNOSIS — Z9012 Acquired absence of left breast and nipple: Secondary | ICD-10-CM | POA: Diagnosis not present

## 2024-03-05 DIAGNOSIS — Z7901 Long term (current) use of anticoagulants: Secondary | ICD-10-CM | POA: Diagnosis not present

## 2024-03-05 DIAGNOSIS — Z801 Family history of malignant neoplasm of trachea, bronchus and lung: Secondary | ICD-10-CM

## 2024-03-05 DIAGNOSIS — Z881 Allergy status to other antibiotic agents status: Secondary | ICD-10-CM

## 2024-03-05 DIAGNOSIS — I5033 Acute on chronic diastolic (congestive) heart failure: Secondary | ICD-10-CM | POA: Diagnosis present

## 2024-03-05 DIAGNOSIS — M810 Age-related osteoporosis without current pathological fracture: Secondary | ICD-10-CM | POA: Diagnosis present

## 2024-03-05 DIAGNOSIS — Z808 Family history of malignant neoplasm of other organs or systems: Secondary | ICD-10-CM

## 2024-03-05 DIAGNOSIS — I10 Essential (primary) hypertension: Secondary | ICD-10-CM | POA: Diagnosis present

## 2024-03-05 DIAGNOSIS — A419 Sepsis, unspecified organism: Secondary | ICD-10-CM | POA: Diagnosis not present

## 2024-03-05 DIAGNOSIS — X58XXXD Exposure to other specified factors, subsequent encounter: Secondary | ICD-10-CM | POA: Diagnosis present

## 2024-03-05 DIAGNOSIS — Z66 Do not resuscitate: Secondary | ICD-10-CM | POA: Diagnosis present

## 2024-03-05 DIAGNOSIS — S72001A Fracture of unspecified part of neck of right femur, initial encounter for closed fracture: Secondary | ICD-10-CM | POA: Diagnosis not present

## 2024-03-05 DIAGNOSIS — J4489 Other specified chronic obstructive pulmonary disease: Secondary | ICD-10-CM | POA: Diagnosis present

## 2024-03-05 DIAGNOSIS — Z86711 Personal history of pulmonary embolism: Secondary | ICD-10-CM

## 2024-03-05 DIAGNOSIS — I509 Heart failure, unspecified: Secondary | ICD-10-CM

## 2024-03-05 DIAGNOSIS — Z800 Family history of malignant neoplasm of digestive organs: Secondary | ICD-10-CM

## 2024-03-05 DIAGNOSIS — J189 Pneumonia, unspecified organism: Secondary | ICD-10-CM | POA: Diagnosis present

## 2024-03-05 DIAGNOSIS — Z825 Family history of asthma and other chronic lower respiratory diseases: Secondary | ICD-10-CM

## 2024-03-05 DIAGNOSIS — Z820 Family history of epilepsy and other diseases of the nervous system: Secondary | ICD-10-CM | POA: Diagnosis not present

## 2024-03-05 DIAGNOSIS — Z885 Allergy status to narcotic agent status: Secondary | ICD-10-CM

## 2024-03-05 DIAGNOSIS — Z882 Allergy status to sulfonamides status: Secondary | ICD-10-CM

## 2024-03-05 DIAGNOSIS — Z90722 Acquired absence of ovaries, bilateral: Secondary | ICD-10-CM

## 2024-03-05 DIAGNOSIS — S72141D Displaced intertrochanteric fracture of right femur, subsequent encounter for closed fracture with routine healing: Secondary | ICD-10-CM

## 2024-03-05 DIAGNOSIS — R0689 Other abnormalities of breathing: Secondary | ICD-10-CM | POA: Diagnosis not present

## 2024-03-05 DIAGNOSIS — R001 Bradycardia, unspecified: Secondary | ICD-10-CM | POA: Diagnosis not present

## 2024-03-05 DIAGNOSIS — J9601 Acute respiratory failure with hypoxia: Secondary | ICD-10-CM | POA: Diagnosis present

## 2024-03-05 DIAGNOSIS — I4891 Unspecified atrial fibrillation: Secondary | ICD-10-CM | POA: Diagnosis present

## 2024-03-05 DIAGNOSIS — Z8262 Family history of osteoporosis: Secondary | ICD-10-CM | POA: Diagnosis not present

## 2024-03-05 DIAGNOSIS — I517 Cardiomegaly: Secondary | ICD-10-CM | POA: Diagnosis not present

## 2024-03-05 DIAGNOSIS — Z90710 Acquired absence of both cervix and uterus: Secondary | ICD-10-CM

## 2024-03-05 DIAGNOSIS — Z9842 Cataract extraction status, left eye: Secondary | ICD-10-CM

## 2024-03-05 DIAGNOSIS — Z515 Encounter for palliative care: Principal | ICD-10-CM

## 2024-03-05 DIAGNOSIS — N179 Acute kidney failure, unspecified: Secondary | ICD-10-CM | POA: Diagnosis present

## 2024-03-05 DIAGNOSIS — G20A1 Parkinson's disease without dyskinesia, without mention of fluctuations: Secondary | ICD-10-CM | POA: Diagnosis present

## 2024-03-05 DIAGNOSIS — Z96642 Presence of left artificial hip joint: Secondary | ICD-10-CM | POA: Diagnosis present

## 2024-03-05 DIAGNOSIS — Z961 Presence of intraocular lens: Secondary | ICD-10-CM | POA: Diagnosis present

## 2024-03-05 DIAGNOSIS — Z888 Allergy status to other drugs, medicaments and biological substances status: Secondary | ICD-10-CM

## 2024-03-05 DIAGNOSIS — Z803 Family history of malignant neoplasm of breast: Secondary | ICD-10-CM

## 2024-03-05 DIAGNOSIS — I2510 Atherosclerotic heart disease of native coronary artery without angina pectoris: Secondary | ICD-10-CM | POA: Diagnosis present

## 2024-03-05 DIAGNOSIS — Z9079 Acquired absence of other genital organ(s): Secondary | ICD-10-CM

## 2024-03-05 DIAGNOSIS — Z823 Family history of stroke: Secondary | ICD-10-CM | POA: Diagnosis not present

## 2024-03-05 DIAGNOSIS — I110 Hypertensive heart disease with heart failure: Secondary | ICD-10-CM | POA: Diagnosis present

## 2024-03-05 DIAGNOSIS — Z9841 Cataract extraction status, right eye: Secondary | ICD-10-CM

## 2024-03-05 DIAGNOSIS — Z9049 Acquired absence of other specified parts of digestive tract: Secondary | ICD-10-CM

## 2024-03-05 DIAGNOSIS — Z79899 Other long term (current) drug therapy: Secondary | ICD-10-CM

## 2024-03-05 LAB — CBC WITH DIFFERENTIAL/PLATELET
Abs Immature Granulocytes: 0.06 10*3/uL (ref 0.00–0.07)
Basophils Absolute: 0 10*3/uL (ref 0.0–0.1)
Basophils Relative: 0 %
Eosinophils Absolute: 0 10*3/uL (ref 0.0–0.5)
Eosinophils Relative: 0 %
HCT: 30.8 % — ABNORMAL LOW (ref 36.0–46.0)
Hemoglobin: 9.5 g/dL — ABNORMAL LOW (ref 12.0–15.0)
Immature Granulocytes: 1 %
Lymphocytes Relative: 7 %
Lymphs Abs: 0.7 10*3/uL (ref 0.7–4.0)
MCH: 30.8 pg (ref 26.0–34.0)
MCHC: 30.8 g/dL (ref 30.0–36.0)
MCV: 100 fL (ref 80.0–100.0)
Monocytes Absolute: 0.7 10*3/uL (ref 0.1–1.0)
Monocytes Relative: 7 %
Neutro Abs: 8.8 10*3/uL — ABNORMAL HIGH (ref 1.7–7.7)
Neutrophils Relative %: 85 %
Platelets: 379 10*3/uL (ref 150–400)
RBC: 3.08 MIL/uL — ABNORMAL LOW (ref 3.87–5.11)
RDW: 15.2 % (ref 11.5–15.5)
WBC: 10.2 10*3/uL (ref 4.0–10.5)
nRBC: 0 % (ref 0.0–0.2)

## 2024-03-05 LAB — RESP PANEL BY RT-PCR (RSV, FLU A&B, COVID)  RVPGX2
Influenza A by PCR: NEGATIVE
Influenza B by PCR: NEGATIVE
Resp Syncytial Virus by PCR: NEGATIVE
SARS Coronavirus 2 by RT PCR: NEGATIVE

## 2024-03-05 LAB — COMPREHENSIVE METABOLIC PANEL WITH GFR
ALT: <5 U/L (ref 0–44)
AST: 50 U/L — ABNORMAL HIGH (ref 15–41)
Albumin: 2.9 g/dL — ABNORMAL LOW (ref 3.5–5.0)
Alkaline Phosphatase: 274 U/L — ABNORMAL HIGH (ref 38–126)
Anion gap: 12 (ref 5–15)
BUN: 27 mg/dL — ABNORMAL HIGH (ref 8–23)
CO2: 23 mmol/L (ref 22–32)
Calcium: 8.7 mg/dL — ABNORMAL LOW (ref 8.9–10.3)
Chloride: 95 mmol/L — ABNORMAL LOW (ref 98–111)
Creatinine, Ser: 1.2 mg/dL — ABNORMAL HIGH (ref 0.44–1.00)
GFR, Estimated: 46 mL/min — ABNORMAL LOW (ref >60)
Glucose, Bld: 130 mg/dL — ABNORMAL HIGH (ref 70–99)
Potassium: 4.7 mmol/L (ref 3.5–5.1)
Sodium: 130 mmol/L — ABNORMAL LOW (ref 135–145)
Total Bilirubin: 1.4 mg/dL — ABNORMAL HIGH (ref 0.0–1.2)
Total Protein: 6.3 g/dL — ABNORMAL LOW (ref 6.5–8.1)

## 2024-03-05 LAB — TROPONIN I (HIGH SENSITIVITY): Troponin I (High Sensitivity): 19 ng/L — ABNORMAL HIGH (ref <18)

## 2024-03-05 LAB — D-DIMER, QUANTITATIVE: D-Dimer, Quant: 1.39 ug{FEU}/mL — ABNORMAL HIGH (ref 0.00–0.50)

## 2024-03-05 LAB — LACTIC ACID, PLASMA
Lactic Acid, Venous: 2.5 mmol/L (ref 0.5–1.9)
Lactic Acid, Venous: 2.7 mmol/L (ref 0.5–1.9)
Lactic Acid, Venous: 2.4 mmol/L (ref 0.5–1.9)
Lactic Acid, Venous: 2.4 mmol/L (ref 0.5–1.9)

## 2024-03-05 LAB — PROTIME-INR
INR: 1.6 — ABNORMAL HIGH (ref 0.8–1.2)
Prothrombin Time: 19.4 s — ABNORMAL HIGH (ref 11.4–15.2)

## 2024-03-05 LAB — PROCALCITONIN: Procalcitonin: <0.1 ng/mL

## 2024-03-05 LAB — BRAIN NATRIURETIC PEPTIDE: B Natriuretic Peptide: 1263.9 pg/mL — ABNORMAL HIGH (ref 0.0–100.0)

## 2024-03-05 MED ORDER — POTASSIUM CHLORIDE CRYS ER 20 MEQ PO TBCR
20.0000 meq | EXTENDED_RELEASE_TABLET | Freq: Every day | ORAL | Status: DC
Start: 2024-03-05 — End: 2024-03-05
  Administered 2024-03-05: 20 meq via ORAL
  Filled 2024-03-05: qty 1

## 2024-03-05 MED ORDER — FUROSEMIDE 40 MG PO TABS: 40.0000 mg | ORAL_TABLET | Freq: Every day | ORAL | Status: DC

## 2024-03-05 MED ORDER — NITROGLYCERIN 0.4 MG SL SUBL: 0.4000 mg | SUBLINGUAL_TABLET | SUBLINGUAL | Status: DC | PRN

## 2024-03-05 MED ORDER — ALBUTEROL SULFATE (2.5 MG/3ML) 0.083% IN NEBU: 2.5000 mg | INHALATION_SOLUTION | Freq: Four times a day (QID) | RESPIRATORY_TRACT | Status: DC | PRN

## 2024-03-05 MED ORDER — METOPROLOL TARTRATE 5 MG/5ML IV SOLN
5.0000 mg | INTRAVENOUS | Status: DC | PRN
  Administered 2024-03-05 – 2024-03-06 (×3): 5 mg via INTRAVENOUS
  Filled 2024-03-05 (×3): qty 5

## 2024-03-05 MED ORDER — BUDESON-GLYCOPYRROL-FORMOTEROL 160-9-4.8 MCG/ACT IN AERO
2.0000 | INHALATION_SPRAY | Freq: Two times a day (BID) | RESPIRATORY_TRACT | Status: DC
  Administered 2024-03-05: 2 via RESPIRATORY_TRACT
  Filled 2024-03-05: qty 5.9

## 2024-03-05 MED ORDER — ONDANSETRON HCL 4 MG/2ML IJ SOLN: 4.0000 mg | Freq: Four times a day (QID) | INTRAMUSCULAR | Status: DC | PRN

## 2024-03-05 MED ORDER — ACETAMINOPHEN 325 MG PO TABS: 650.0000 mg | ORAL_TABLET | Freq: Four times a day (QID) | ORAL | Status: DC | PRN

## 2024-03-05 MED ORDER — FUROSEMIDE 10 MG/ML IJ SOLN
40.0000 mg | Freq: Once | INTRAMUSCULAR | Status: AC
  Administered 2024-03-05: 40 mg via INTRAVENOUS
  Filled 2024-03-05: qty 4

## 2024-03-05 MED ORDER — SODIUM CHLORIDE 0.9 % IV SOLN
500.0000 mg | Freq: Once | INTRAVENOUS | Status: AC
  Administered 2024-03-05: 500 mg via INTRAVENOUS
  Filled 2024-03-05: qty 5

## 2024-03-05 MED ORDER — CARBIDOPA-LEVODOPA ER 25-100 MG PO TBCR
1.0000 | EXTENDED_RELEASE_TABLET | Freq: Every day | ORAL | Status: DC
  Administered 2024-03-05: 1 via ORAL
  Filled 2024-03-05 (×3): qty 1

## 2024-03-05 MED ORDER — MORPHINE SULFATE (CONCENTRATE) 10 MG /0.5 ML PO SOLN: 5.0000 mg | ORAL | Status: DC | PRN

## 2024-03-05 MED ORDER — ALPRAZOLAM 0.25 MG PO TABS
0.2500 mg | ORAL_TABLET | Freq: Once | ORAL | Status: AC
  Administered 2024-03-06: 0.25 mg via ORAL
  Filled 2024-03-05: qty 1

## 2024-03-05 MED ORDER — CEFTRIAXONE SODIUM 2 G IJ SOLR
2.0000 g | Freq: Once | INTRAMUSCULAR | Status: AC
  Administered 2024-03-05: 2 g via INTRAVENOUS
  Filled 2024-03-05: qty 20

## 2024-03-05 MED ORDER — HYDROCODONE-ACETAMINOPHEN 5-325 MG PO TABS
1.0000 | ORAL_TABLET | Freq: Four times a day (QID) | ORAL | Status: DC
  Administered 2024-03-05 – 2024-03-06 (×3): 1 via ORAL
  Filled 2024-03-05 (×3): qty 1

## 2024-03-05 MED ORDER — AMIODARONE HCL 200 MG PO TABS: 100.0000 mg | ORAL_TABLET | Freq: Every day | ORAL | Status: DC

## 2024-03-05 MED ORDER — MORPHINE SULFATE (CONCENTRATE) 10 MG /0.5 ML PO SOLN
5.0000 mg | ORAL | Status: DC | PRN
  Administered 2024-03-05: 5 mg via ORAL
  Filled 2024-03-05 (×3): qty 0.5

## 2024-03-05 MED ORDER — ONDANSETRON HCL 4 MG PO TABS: 4.0000 mg | ORAL_TABLET | Freq: Four times a day (QID) | ORAL | Status: DC | PRN

## 2024-03-05 MED ORDER — APIXABAN 2.5 MG PO TABS
2.5000 mg | ORAL_TABLET | Freq: Two times a day (BID) | ORAL | Status: DC
  Administered 2024-03-05: 2.5 mg via ORAL
  Filled 2024-03-05 (×2): qty 1

## 2024-03-05 MED ORDER — SODIUM CHLORIDE 0.9 % IV BOLUS
500.0000 mL | Freq: Once | INTRAVENOUS | Status: AC
  Administered 2024-03-05: 500 mL via INTRAVENOUS

## 2024-03-05 MED ORDER — ENOXAPARIN SODIUM 40 MG/0.4ML IJ SOSY: 40.0000 mg | PREFILLED_SYRINGE | INTRAMUSCULAR | Status: DC

## 2024-03-05 NOTE — ED Notes (Signed)
RT called for BiPAP

## 2024-03-05 NOTE — Assessment & Plan Note (Signed)
 Heart rate 70s to low 100s Continue Eliquis and amiodarone

## 2024-03-05 NOTE — Sepsis Progress Note (Signed)
 Notified provider and bedside nurse of need to order and draw repeat lactic acid # 3 at 1400.

## 2024-03-05 NOTE — ED Provider Notes (Signed)
 [facility] Provider Note    Event Date/Time   First MD Initiated Contact with Patient 03/05/24 [phone]     (approximate)   History   Shortness of Breath   HPI Crystal Bass is a 83 y.o. female with history of COPD, HTN, HFpEF, A-fib on Eliquis, Parkinson's presenting today for shortness of breath.  Patient has reportedly had worsening shortness of breath with cough and congestion over the past several days.  She was reportedly started on oxygen at her facility 2 days ago but given worsening symptoms EMS was called today.  Initially on 2 L and then increased to 4 L.  She notes productive green sputum in her cough and increased respiratory rate.  Otherwise denies chest pain, nausea, vomiting, abdominal pain, diarrhea, constipation.  Leg swelling noted.  Patient is DNR DNI.  Spoke with patient's brother over the phone who agrees with her current DNR/DNI but otherwise pursuing other medical care.     Physical Exam   Triage Vital Signs: ED Triage Vitals  Encounter Vitals Group     BP 03/05/24 0800 127/81     Systolic BP Percentile --      Diastolic BP Percentile --      Pulse Rate 03/05/24 0800 70     Resp 03/05/24 0800 (!) 32     Temp 03/05/24 0802 97.6 F (36.4 C)     Temp Source 03/05/24 0802 Oral     SpO2 03/05/24 0800 (!) 83 %     Weight --      Height --      Head Circumference --      Peak Flow --      Pain Score 03/05/24 0802 0     Pain Loc --      Pain Education --      Exclude from Growth Chart --     Most recent vital signs: Vitals:   03/05/24 0950 03/05/24 1000  BP:  114/79  Pulse: 79 (!) 58  Resp: (!) 28 (!) 26  Temp:    SpO2: 97% 96%   Physical Exam: I have reviewed the vital signs and nursing notes. General: Awake, alert, increased work of breathing. Head:  Atraumatic, normocephalic.   ENT:  EOM intact, PERRL. Oral mucosa is pink and moist with no lesions. Neck: Neck is supple with full range of motion, No meningeal  signs. Cardiovascular:  tachycardia, RR, No murmurs. Peripheral pulses palpable and equal bilaterally. Respiratory:  Symmetrical chest wall expansion.  Tachypnea with accessory muscle usage, scattered wheezing but also scattered rhonchi present throughout Musculoskeletal:  No cyanosis. 2+ pitting edema bilaterally. Moving extremities with full ROM Abdomen:  Soft, nontender, nondistended. Neuro:  GCS 15, moving all four extremities, interacting appropriately. Speech clear. Psych:  Calm, appropriate.   Skin:  Warm, dry, no rash.    ED Results / Procedures / Treatments   Labs (all labs ordered are listed, but only abnormal results are displayed) Labs Reviewed  LACTIC ACID, PLASMA - Abnormal; Notable for the following components:      Result Value   Lactic Acid, Venous 2.5 (*)    All other components within normal limits  COMPREHENSIVE METABOLIC PANEL WITH GFR - Abnormal; Notable for the following components:   Sodium 130 (*)    Chloride 95 (*)    Glucose, Bld 130 (*)    BUN 27 (*)    Creatinine, Ser 1.20 (*)    Calcium 8.7 (*)    Total Protein  6.3 (*)    Albumin 2.9 (*)    AST 50 (*)    Alkaline Phosphatase 274 (*)    Total Bilirubin 1.4 (*)    GFR, Estimated 46 (*)    All other components within normal limits  CBC WITH DIFFERENTIAL/PLATELET - Abnormal; Notable for the following components:   RBC 3.08 (*)    Hemoglobin 9.5 (*)    HCT 30.8 (*)    Neutro Abs 8.8 (*)    All other components within normal limits  PROTIME-INR - Abnormal; Notable for the following components:   Prothrombin Time 19.4 (*)    INR 1.6 (*)    All other components within normal limits  BRAIN NATRIURETIC PEPTIDE - Abnormal; Notable for the following components:   B Natriuretic Peptide 1,263.9 (*)    All other components within normal limits  BLOOD GAS, VENOUS - Abnormal; Notable for the following components:   pO2, Ven 31 (*)    All other components within normal limits  TROPONIN I (HIGH SENSITIVITY)  - Abnormal; Notable for the following components:   Troponin I (High Sensitivity) 19 (*)    All other components within normal limits  RESP PANEL BY RT-PCR (RSV, FLU A&B, COVID)  RVPGX2  CULTURE, BLOOD (ROUTINE X 2)  CULTURE, BLOOD (ROUTINE X 2)  LACTIC ACID, PLASMA  PROCALCITONIN     EKG My EKG interpretation: Rate of 136, atrial fibrillation, right axis deviation.  No acute ST elevations or depressions   RADIOLOGY Independently interpreted chest x-ray showing increased volume on the chest but unclear possible pneumonia   PROCEDURES:  Critical Care performed: Yes, see critical care procedure note(s)  .Critical Care  Performed by: Janith Lima, MD Authorized by: Janith Lima, MD   Critical care provider statement:    Critical care time (minutes):  30   Critical care was necessary to treat or prevent imminent or life-threatening deterioration of the following conditions:  Respiratory failure and sepsis   Critical care was time spent personally by me on the following activities:  Development of treatment plan with patient or surrogate, discussions with consultants, evaluation of patient's response to treatment, examination of patient, ordering and review of laboratory studies, ordering and review of radiographic studies, ordering and performing treatments and interventions, pulse oximetry, re-evaluation of patient's condition and review of old charts   I assumed direction of critical care for this patient from another provider in my specialty: no     Care discussed with: admitting provider      MEDICATIONS ORDERED IN ED: Medications  azithromycin (ZITHROMAX) 500 mg in sodium chloride 0.9 % 250 mL IVPB (has no administration in time range)  cefTRIAXone (ROCEPHIN) 2 g in sodium chloride 0.9 % 100 mL IVPB (2 g Intravenous New Bag/Given 03/05/24 0946)  furosemide (LASIX) injection 40 mg (40 mg Intravenous Given 03/05/24 0946)     IMPRESSION / MDM / ASSESSMENT AND PLAN / ED  COURSE  I reviewed the triage vital signs and the nursing notes.                              Differential diagnosis includes, but is not limited to, COPD exacerbation, pneumonia, COVID, flu, RSV, heart failure, pneumothorax, A-fib with RVR  Patient's presentation is most consistent with acute presentation with potential threat to life or bodily function.  Patient is an 83 year old female presenting today for shortness of breath.  On arrival she is hypoxic on  room air at 82% and still hypoxic on 2 L at 85%.  Transition her to high flow nasal cannula.  Patient also in A-fib with RVR but given concerns for volume overload, will monitor right now as it is likely compensatory before giving additional fluids or rate controlling medication.  Meeting sepsis criteria and started on ceftriaxone/azithromycin for suspected pneumonia.  Chest x-ray looks more indicative of heart failure with fluid on the lungs but cannot fully rule out pneumonia.  Slight AKI.  No leukocytosis.  Lactic slightly elevated at 2.5 and will give 500 cc of fluid in the setting of heart failure exacerbation.  Her BNP is notably elevated at 1263.  Given Lasix to help with concern of volume overload.  Patient admitted to hospitalist for acute hypoxic respiratory failure likely secondary to CHF but will continue to treat for potential pneumonia.  The patient is on the cardiac monitor to evaluate for evidence of arrhythmia and/or significant heart rate changes. Clinical Course as of 03/05/24 1017  Thu Mar 05, 2024  0902 Comprehensive metabolic panel(!) Slight AKI from her baseline. [DW]    Clinical Course User Index [DW] Janith Lima, MD     FINAL CLINICAL IMPRESSION(S) / ED DIAGNOSES   Final diagnoses:  Acute hypoxic respiratory failure (HCC)  Acute on chronic congestive heart failure, unspecified heart failure type (HCC)  Sepsis, due to unspecified organism, unspecified whether acute organ dysfunction present [facility])  Atrial  fibrillation with RVR (HCC)     Rx / DC Orders   ED Discharge Orders     None        Note:  This document was prepared using Dragon voice recognition software and may include unintentional dictation errors.   Janith Lima, MD 03/05/24 1018

## 2024-03-05 NOTE — Sepsis Progress Note (Signed)
 Notified bedside nurse of need to draw repeat lactic acid.

## 2024-03-05 NOTE — ED Notes (Signed)
 Assumed care of patient. Verified with [facility] cardiac monitoring.

## 2024-03-05 NOTE — Sepsis Progress Note (Signed)
 Elink monitoring for the code sepsis protocol.

## 2024-03-05 NOTE — ED Notes (Signed)
Mepilex applied to sacrum

## 2024-03-05 NOTE — H&P (Addendum)
 History and Physical    Patient: Crystal Bass GNF:[id number] DOB: [birth year] DOA: 03/05/2024 DOS: the patient was seen and examined on 03/05/2024 PCP: Earnestine Mealing, MD  Patient coming from: Home  Chief Complaint:  Chief Complaint  Patient presents with   Shortness of Breath   HPI: Crystal Bass is a 83 y.o. female with medical history significant of Parkinson's disease, COPD/bronchiectasis, mitral regurgitation s/p MitraClip in 2023 with redo 03/2023, diastolic CHF history of PE in 8469, A-fib in 08/2023 s/p cardioversion, on Eliquis and amiodarone presenting with acute respiratory failure with hypoxia, acute on chronic HFpEF.  History from patient as well as son at the bedside.  Per report, patient with increased work of breathing over multiple days.  Currently local skilled nursing facility.  Initially concern for COPD.  Given steroids as well as breathing treatments.  Minimal improvement in symptoms.  Noted progressive lower extremity swelling over similar timeframe.  Noted to have been admitted last month for issues including broken hip status post operative repair.  Has had difficulty with ambulation from this point per the son.  No fevers or chills.  No chest pain. Presented to the ER afebrile, heart rate 50s to low 100s, BP stable.  Respirations into the 30s.  Requiring BiPAP.  White count 10.2, hemoglobin 9.5, platelets 379, lactate 2.5-2.7.  VBG stable.  COVID flu and RSV negative.  Creatinine 1.2.  Sodium 130.  BNP 1264. Review of Systems: As mentioned in the history of present illness. All other systems reviewed and are negative. 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 10/2021 Cath: Nl cors.   Arthritis    Asthma    Breast cancer (HCC) 2002   infiltrative ductal carcinoma    Cataract 2019   Chronic heart failure with preserved ejection fraction (HFpEF) (HCC)    COPD (chronic  obstructive pulmonary disease) (HCC) 07/2007   Coronary artery disease    Ductal carcinoma of breast, estrogen receptor positive, stage 1 (HCC) 09/16/2012   Endometriosis    GERD (gastroesophageal reflux disease)    Hx of mitral valve repair    Hypercalcemia 09/14/2013   Hypertension    Lesion of breast 1992   right, benign   Lung disease    secondary to [name] infection   Mastalgia 05/1994   Mitral regurgitation    a. 10/2021 RHC: PCWP V wave of 34; b. 01/2022 TEE: Severe flail/prolapse of large area of A2 scallop with severe posteriorly directed MR; c. 01/2022 TEER w /Mitraclip XTW x 2; d. 07/2022 Echo: EF 60-65%. No rwma, mild LVH, GrIII DD. Sev dil LA. Mod elev PASP. Mod-Sev MR. Mod MS.   Moderate mitral regurgitation by prior echocardiogram    Osteoporosis, post-menopausal 03/14/2012   Ovarian tumor of borderline malignancy, right 2004   Palpitations    a. 02/2022 Zio: Predominantly sinus rhythm at 67 (52-171).  2 runs of NSVT -fastest 9 beats at 167.  35 SVT runs-longest 19 beats, fastest 171x6 beats.   Pancreatitis    secondary to Cholelithiasis   Parkinson disease (HCC) 02/10/2015   Post-thoracotomy pain syndrome    Pseudomonas pneumonia (HCC) 03/2008   Status post implantation of mitral valve leaflet clip 02/15/2022   s/p XTW MitraClip x2 by Dr. Excell Seltzer in the A2/P2: b. 04/03/23 s/p third mitraclip implantation.   Traumatic closed fracture of distal clavicle with minimal  displacement, left, initial encounter 06/2021   [facility]   Uterine fibroid    Vitamin D deficiency    Past Surgical History:  Procedure Laterality Date   ABDOMINAL HYSTERECTOMY     & BSO for Mucinous borderline tumor of R ovary 2004   APPENDECTOMY  2004   BREAST BIOPSY  08/2004   right breast-benign   BREAST LUMPECTOMY  1992   benign   BUBBLE STUDY  12/27/2021   Procedure: BUBBLE STUDY;  Surgeon: Sande Rives, MD;  Location: [facility] ENDOSCOPY;  Service: Cardiovascular;;   CARDIAC CATHETERIZATION  2007    essentially negation for significant CAD   CARDIOVERSION N/A 09/05/2023   Procedure: CARDIOVERSION;  Surgeon: Debbe Odea, MD;  Location: [facility] ORS;  Service: Cardiovascular;  Laterality: N/A;   CATARACT EXTRACTION W/PHACO Left 07/24/2023   Procedure: CATARACT EXTRACTION PHACO AND INTRAOCULAR LENS PLACEMENT (IOC) LEFT 5.34 0037.3;  Surgeon: Lockie Mola, MD;  Location: [facility]GERY [facility];  Service: Ophthalmology;  Laterality: Left;   CATARACT EXTRACTION W/PHACO Right 08/07/2023   Procedure: CATARACT EXTRACTION PHACO AND INTRAOCULAR LENS PLACEMENT (IOC) RIGHT 7.83 00:47.9;  Surgeon: Lockie Mola, MD;  Location: [facility] SURGERY [facility];  Service: Ophthalmology;  Laterality: Right;   CHOLECYSTECTOMY  2001   COLONOSCOPY  2014   Dr Marina Goodell , due 2019   COLONOSCOPY  09/2018   2 TA removed, int hem, no f/u needed (Dr Marina Goodell)    ESOPHAGOGASTRODUODENOSCOPY  09/2018   GERD with esophagitis and stricture dilated, antral gastritis negative for H pylori Marina Goodell)   HIP ARTHROPLASTY Left 08/21/2022   Procedure: ARTHROPLASTY BIPOLAR HIP (HEMIARTHROPLASTY);  Surgeon: Juanell Fairly, MD;  Location: [facility] ORS;  Service: Orthopedics;  Laterality: Left;   INTRAMEDULLARY (IM) NAIL INTERTROCHANTERIC Right 02/10/2024   Procedure: FIXATION, FRACTURE, INTERTROCHANTERIC, WITH INTRAMEDULLARY ROD;  Surgeon: Christena Flake, MD;  Location: [facility] ORS;  Service: Orthopedics;  Laterality: Right;   LUNG BIOPSY  2002   [name], Dr Edwyna Shell   MASTECTOMY MODIFIED RADICAL Left 2002   oral chemotheraphy (tamoxifen then Armidex) no radiation, Dr.[name]   RIGHT/LEFT HEART CATH AND CORONARY ANGIOGRAPHY Bilateral 11/14/2021   Procedure: RIGHT/LEFT HEART CATH AND CORONARY ANGIOGRAPHY;  Surgeon: Iran Ouch, MD;  Location: [facility] INVASIVE CV LAB;  Service: Cardiovascular;  Laterality: Bilateral;   TEE WITHOUT CARDIOVERSION N/A 12/29/2018   Procedure: TRANSESOPHAGEAL ECHOCARDIOGRAM (TEE);  Surgeon: Iran Ouch, MD;   Location: [facility] ORS;  Service: Cardiovascular;  Laterality: N/A;   TEE WITHOUT CARDIOVERSION N/A 12/27/2021   Procedure: TRANSESOPHAGEAL ECHOCARDIOGRAM (TEE);  Surgeon: Sande Rives, MD;  Location: [facility] ENDOSCOPY;  Service: Cardiovascular;  Laterality: N/A;   TEE WITHOUT CARDIOVERSION N/A 02/15/2022   Procedure: TRANSESOPHAGEAL ECHOCARDIOGRAM (TEE);  Surgeon: Tonny Bollman, MD;  Location: [facility] INVASIVE CV LAB;  Service: Cardiovascular;  Laterality: N/A;   TEE WITHOUT CARDIOVERSION N/A 05/14/2022   Procedure: TRANSESOPHAGEAL ECHOCARDIOGRAM (TEE);  Surgeon: Sande Rives, MD;  Location: [facility] ENDOSCOPY;  Service: Cardiovascular;  Laterality: N/A;   TEE WITHOUT CARDIOVERSION N/A 04/03/2023   Procedure: TRANSESOPHAGEAL ECHOCARDIOGRAM;  Surgeon: Tonny Bollman, MD;  Location: [facility] INVASIVE CV LAB;  Service: Cardiovascular;  Laterality: N/A;   TEE WITHOUT CARDIOVERSION N/A 09/05/2023   Procedure: TRANSESOPHAGEAL ECHOCARDIOGRAM;  Surgeon: Debbe Odea, MD;  Location: [facility] ORS;  Service: Cardiovascular;  Laterality: N/A;   TONSILLECTOMY  1946   TRANSCATHETER MITRAL EDGE TO EDGE REPAIR N/A 02/15/2022   Procedure: MITRAL VALVE REPAIR;  Surgeon: Tonny Bollman, MD;  Location: [facility] INVASIVE CV LAB;  Service: Cardiovascular;  Laterality: N/A;  TRANSCATHETER MITRAL EDGE TO EDGE REPAIR N/A 04/03/2023   Procedure: MITRAL VALVE REPAIR;  Surgeon: Tonny Bollman, MD;  Location: [facility] INVASIVE CV LAB;  Service: Cardiovascular;  Laterality: N/A;   Social History:  reports that she has never smoked. She has never used smokeless tobacco. She reports that she does not drink alcohol and does not use drugs.  Allergies  Allergen Reactions   Sulfa Antibiotics Anaphylaxis   Sulfonamide Derivatives Anaphylaxis   Topamax [Topiramate] Other (See Comments)    Metabolic acidosis    Clarithromycin Other (See Comments)    pericarditis  Other reaction(s): Not available   Hydrocodone Other (See Comments)    "hyper and  climbing the walls"   Motrin [Ibuprofen] Other (See Comments)    headaches   Misc. Sulfonamide Containing Compounds     Other reaction(s): Not available   Symbicort [Budesonide-Formoterol Fumarate] Other (See Comments)    02/07/15 tremor   Tetracycline Hcl Other (See Comments)   Percocet [Oxycodone-Acetaminophen] Itching and Rash   Tape Itching and Rash    Use paper tape only   Tetracyclines & Related Other (See Comments)    "immediate yeast infection"    Family History  Problem Relation Age of Onset   Emphysema Mother        d.64 was never a smoker   Lung cancer Father 78       d.82 history of smoking   Breast cancer Sister 64   Colon cancer Sister    Melanoma Maternal Aunt    Liver cancer Maternal Aunt    Cancer Maternal Uncle        unspecified type   Cancer Maternal Uncle        unspecified type   Cancer Maternal Uncle        unspecified type   Stroke Paternal Aunt        in mid 76s   Osteoporosis Paternal Aunt    Myasthenia gravis Paternal Aunt    Cancer Maternal Grandmother        d.82s unspecified GI cancer   Cancer Maternal Grandfather        d.62s unspecified type   Breast cancer Cousin        d.60s-daughter of unaffected paternal aunt Doristine Church   Colon cancer Cousin 60       d.70-daughter of unaffected maternal aunt Leila   Lung cancer Cousin 70       d.70-sisters to each other, both daughters of maternal uncle Johnny   Parkinson's disease Cousin    Diabetes Neg Hx    Heart disease Neg Hx    Stomach cancer Neg Hx    Ulcerative colitis Neg Hx     Prior to Admission medications   Medication Sig Start Date End Date Taking? Authorizing Provider  acetaminophen (TYLENOL) 500 MG tablet Take 1,000 mg by mouth 3 (three) times daily.   Yes [provider]  albuterol (VENTOLIN HFA) 108 (90 Base) MCG/ACT inhaler Inhale 2 puffs into the lungs every 6 (six) hours as needed for wheezing or shortness of breath. 06/04/22  Yes Eustaquio Boyden, MD  amiodarone  (PACERONE) 100 MG tablet Take 100 mg by mouth daily.   Yes [provider]  apixaban (ELIQUIS) 2.5 MG TABS tablet Take 1 tablet (2.5 mg total) by mouth 2 (two) times daily. 08/27/23  Yes Iran Ouch, MD  bisacodyl (DULCOLAX) 10 MG suppository Place 10 mg rectally as needed for moderate constipation.   Yes [provider]  calcium carbonate (OSCAL)  1500 (600 Ca) MG TABS tablet Take 600 mg of elemental calcium by mouth daily.   Yes [provider]  calcium carbonate (TUMS - DOSED IN MG ELEMENTAL CALCIUM) 500 MG chewable tablet Chew 2 tablets by mouth daily as needed for indigestion or heartburn.   Yes [provider]  Carbidopa-Levodopa ER (SINEMET CR) 25-100 MG tablet controlled release Take 2 tablets by mouth 2 (two) times daily. 02/13/24  Yes Sunnie Nielsen, DO  Carbidopa-Levodopa ER (SINEMET CR) 25-100 MG tablet controlled release Take 1 tablet by mouth daily before supper. 02/13/24  Yes Sunnie Nielsen, DO  Cholecalciferol (VITAMIN D3) 25 MCG (1000 UT) CAPS Take 1 capsule (1,000 Units total) by mouth daily. 09/27/22  Yes Eustaquio Boyden, MD  docusate sodium (COLACE) 100 MG capsule Take 100 mg by mouth 2 (two) times daily.   Yes [provider]  EPINEPHrine 0.3 mg/0.3 mL IJ SOAJ injection Inject 0.3 mg into the muscle as needed for anaphylaxis. 02/25/20  Yes [provider]  fexofenadine (ALLEGRA) 30 MG/5ML suspension Take 5 mLs by mouth 2 (two) times daily.   Yes [provider]  fluticasone (FLONASE) 50 MCG/ACT nasal spray Place 2 sprays into both nostrils daily as needed. 10/15/23  Yes [provider]  Fluticasone-Umeclidin-Vilant (TRELEGY ELLIPTA) 100-62.5-25 MCG/ACT AEPB Inhale 1 puff into the lungs daily. 04/27/22  Yes Cobb, Ruby Cola, NP  furosemide (LASIX) 40 MG tablet TAKE ONE TABLET BY MOUTH ONCE DAILY *NEW DOSE* 03/29/23  Yes Iran Ouch, MD  gabapentin ([name]) 300 MG capsule Take 300 mg by mouth 2  (two) times daily. For neuropathic pain. Give one Capsule by mouth once daily.   Yes [provider]  HYDROcodone-acetaminophen (NORCO/VICODIN) 5-325 MG tablet Take 1 tablet by mouth every 6 (six) hours. 03/04/24  Yes Earnestine Mealing, MD  Infant Care Products [facility]) OINT Apply 1 Application topically 3 (three) times daily.   Yes [provider]  Ipratropium-Albuterol (COMBIVENT) 20-100 MCG/ACT AERS respimat Inhale 1 puff into the lungs once.   Yes [provider]  methocarbamol (ROBAXIN) 500 MG tablet Take 1 tablet (500 mg total) by mouth every 8 (eight) hours as needed for muscle spasms. 02/13/24  Yes Sunnie Nielsen, DO  Multiple Vitamin (MULTIVITAMIN WITH MINERALS) TABS tablet Take 1 tablet by mouth daily. 02/13/24  Yes Sunnie Nielsen, DO  nitroGLYCERIN (NITROSTAT) 0.4 MG SL tablet Place 0.4 mg under the tongue every 5 (five) minutes as needed for chest pain. 12/30/23  Yes [provider]  pantoprazole (PROTONIX) 20 MG tablet Take 1 tablet (20 mg total) by mouth daily. 04/01/23  Yes Eustaquio Boyden, MD  polyethylene glycol (MIRALAX / GLYCOLAX) 17 g packet Take 17 g by mouth daily as needed.   Yes [provider]  predniSONE (DELTASONE) 20 MG tablet Take 40 mg by mouth in the morning.   Yes [provider]  cetirizine (ZYRTEC) 10 MG tablet Take 10 mg by mouth daily. Patient not taking: Reported on 03/05/2024    [provider]    Physical Exam: Vitals:   03/05/24 1130 03/05/24 1150 03/05/24 1200 03/05/24 1230  BP: 112/76  103/75 (!) 107/90  Pulse: 95  (!) 106 (!) 103  Resp: (!) 38  (!) 39 (!) 29  Temp:      TempSrc:      SpO2: 97% 98% 99% 100%  Weight:      Height:       Physical Exam Constitutional:      Appearance: She is normal  weight.     Comments: Bipap in place    HENT:     Head: Normocephalic.     Nose: Nose normal.     Mouth/Throat:     Mouth: Mucous membranes are moist.  Eyes:     Pupils: Pupils are  equal, round, and reactive to light.  Cardiovascular:     Rate and Rhythm: Normal rate and regular rhythm.  Pulmonary:     Effort: Pulmonary effort is normal.  Abdominal:     General: Bowel sounds are normal.  Musculoskeletal:        General: Normal range of motion.  Skin:    General: Skin is warm.  Neurological:     General: No focal deficit present.  Psychiatric:        Mood and Affect: Mood normal.     Data Reviewed:  There are no new results to review at this time.  DG Chest Port 1 View CLINICAL DATA:  Sepsis  EXAM: PORTABLE CHEST 1 VIEW  COMPARISON:  Chest x-ray performed February 09, 2024  FINDINGS: Ill-defined airspace opacity is present in the right midlung zone. Heart is enlarged. Central interstitial airspace opacities are present. Limited evaluation of the retrocardiac region.  IMPRESSION: 1. Ill-defined airspace opacity in the right midlung zone. Leading differential is pneumonia given the provided clinical data. Edema is also a consideration. 2. Enlarged heart.  Electronically Signed   By: Lowell Guitar M.D.   On: 03/05/2024 08:43  Lab Results  Component Value Date   WBC 10.2 03/05/2024   HGB 9.5 (L) 03/05/2024   HCT 30.8 (L) 03/05/2024   MCV 100.0 03/05/2024   PLT 379 03/05/2024   Last metabolic panel Lab Results  Component Value Date   GLUCOSE 130 (H) 03/05/2024   NA 130 (L) 03/05/2024   K 4.7 03/05/2024   CL 95 (L) 03/05/2024   CO2 23 03/05/2024   BUN 27 (H) 03/05/2024   CREATININE 1.20 (H) 03/05/2024   GFRNONAA 46 (L) 03/05/2024   CALCIUM 8.7 (L) 03/05/2024   PHOS 3.6 12/07/2022   PROT 6.3 (L) 03/05/2024   ALBUMIN 2.9 (L) 03/05/2024   BILITOT 1.4 (H) 03/05/2024   ALKPHOS 274 (H) 03/05/2024   AST 50 (H) 03/05/2024   ALT <5 03/05/2024   ANIONGAP 12 03/05/2024    Assessment and Plan: Acute respiratory failure with hypoxia (HCC) Acute on chronic HFpEF Decompensated respiratory failure requiring BiPAP in the setting of chronic  HFpEF chest x-ray with cardiomegaly and edematous pattern 2D echo March 2025 with a EF of 70-75% BNP 1264 Some question of possible pneumonia chest x-ray.  However, white count within normal limits.  Procalcitonin less than 0.10-less likely IV Lasix 2D echo D dimer pending  Strict ins and outs Cardiology consultation Follow-up recommendations  Closed right hip fracture, initial encounter [facility]) Recent admission 01/2024 for hip fracture  S/p FIXATION, FRACTURE, INTERTROCHANTERIC, WITH INTRAMEDULLARY ROD (Right) on 3/17  Stable  Monitor   Atrial fibrillation s/p cardioversion 08/2023 (HCC) Heart rate 70s to low 100s Continue Eliquis and amiodarone  COPD (chronic obstructive pulmonary disease) (HCC) Recently evaluated for possible COPD exacerbation status post course of steroids and antibiotics Noted concurrent acute respiratory failure with hypoxia No active wheezing at present Will continue with home inhalers Monitor Formally reassess if respiratory symptoms persist despite diuresis  Essential hypertension BP stable  Titrate home regimen  Monitor w/ diuresis     Parkinson disease (HCC) Stable Continue Sinemet      Advance Care Planning:  Code Status: Limited: Do not attempt resuscitation (DNR) -DNR-LIMITED -Do Not Intubate/DNI    Consults: Cardiology   Family Communication: Family at the bedside   Severity of Illness: The appropriate patient status for this patient is OBSERVATION. Observation status is judged to be reasonable and necessary in order to provide the required intensity of service to ensure the patient's safety. The patient's presenting symptoms, physical exam findings, and initial radiographic and laboratory data in the context of their medical condition is felt to place them at decreased risk for further clinical deterioration. Furthermore, it is anticipated that the patient will be medically stable for discharge from the hospital within 2 midnights of  admission.   Author: Floydene Flock, MD 03/05/2024 1:03 PM  For on call review [email or web address].

## 2024-03-05 NOTE — Sepsis Progress Note (Signed)
 Notified bedside nurse of need to administer antibiotics.

## 2024-03-05 NOTE — ED Triage Notes (Signed)
 Pt to ED via [facility] from [facility] at [facility]. Pt reports breathing difficultly started 2 days ago but has gotten worse. Pt also reports bilateral leg swelling. Normally does not wear oxygen but has been wearing 2L [place] that last few days sats 91%. EMS placed her on 4L [place] with improvement to 98%. Pt given neb tx PTA by facility and 1 duoneb by EMS. Pt has alos been taking prednisone. Hx of COPD. RA sats on arrival 86%.   EMS VS 130 afib  130/82 ET 30 RR 20 after neb  91% on 2L 98% during neb

## 2024-03-05 NOTE — Progress Notes (Signed)
 Case discussed w/ brother Legrand Rams [facility]) over the phone.  Palliative care noted to have seen patient earlier in the day.  Per Loraine Leriche, pt wishes to be placed on comfort measures and to go back to facility prior to discharge.  Will place on comfort measures  Palliative care to follow up with patient and brother in am.

## 2024-03-05 NOTE — ED Notes (Signed)
 Report given to [facility] in C pod of ED. Pt updated on care plan.

## 2024-03-05 NOTE — Assessment & Plan Note (Signed)
Stable Continue Sinemet 

## 2024-03-05 NOTE — Consult Note (Signed)
 Consultation Note Date: 03/05/2024 at 1515  Patient Name: Crystal Bass  DOB: Aug 03, 1941  MRN: [id number]  Age / Sex: 83 y.o., female  PCP: Earnestine Mealing, MD Referring Physician: Floydene Flock, MD  HPI/Patient Profile: 83 y.o. female  with past medical history of Parkinson's disease, COPD/bronchiectasis, mitral regurgitation s/p MitraClip in 2023 with redo 03/2023, diastolic CHF history of PE in 0102, A-fib in 08/2023 s/p cardioversion, on Eliquis and amiodarone, and recent hospitalization 01/2023 for right hip fracture s/p fixation of anterior trochanteric with intramedullary rod admitted on 03/05/2024 with increasing SOB unrelieved by nebulizers/at home medications.   Of note, patient resides at [facility] [place].  Patient is being treated for acute respiratory failure with hypoxia, acute on chronic HFpEF, and possible COPD exacerbation.   PMT was consulted to support patient and family with goals of care discussions.  Clinical Assessment and Goals of Care: Extensive chart review completed prior to meeting patient including labs, vital signs, imaging, progress notes, orders, and available advanced directive documents from current and previous encounters. I then met with patient in ED to discuss diagnosis prognosis, GOC, EOL wishes, disposition and options.  I introduced Palliative Medicine as specialized medical care for people living with serious illness. It focuses on providing relief from the symptoms and stress of a serious illness. The goal is to improve quality of life for both the patient and the family.  We discussed a brief life review of the patient.  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  As far as functional and nutritional status patient endorses that she has had shortness of breath  and has felt weak for "quite some time".  When asked to quantify, she endorses she has not felt well for several months now.  We discussed patient's current illness-acute respiratory failure and acute on chronic HFpEF-and what it means in the larger context of patient's on-going co-morbidities.    I attempted to elicit values and goals of care important to the patient.  I highlighted that patient has extensive advance care planning documentation, including a DNR with limited interventions, advanced directive naming her brother Loraine Leriche is her HCPOA, and a MOST form (see ACP tab of Epic).  She shares that she did not want to come back to the hospital.  She says she wishes she could have been able to stay "where I call home now"-[name].  When asked why she returned to the hospital, she shares she was having such difficulty breathing and she was told that she would get relief if she came to the hospital.  She shares she regrets coming to the hospital now.  She says, "I cannot believe I am here again".  I shared that I have been in touch with patient's primary care provider Dr. Sydnee Cabal, who also conveyed patient's wishes have always been that should her health deteriorate that she would be able to remain at her facility.  Patient  nodded her head in agreement and said "that is right"..  In light of patient's past comments to her PCP and her comments to me today, I discussed the difference between full scope of care and aggressive measures versus comfort care. With a comfort focused path, she would no longer receive aggressive medical interventions such as continuous vital signs, lab work, radiology testing, or medications not focused on comfort. All care would focus on how she is looking and feeling. This would include management of any symptoms that may cause discomfort, pain, shortness of breath, cough, nausea, agitation, anxiety, and/or secretions etc. Symptoms would be managed with medications and other  non-pharmacological interventions such as spiritual support if requested, repositioning, music therapy, or therapeutic listening.   Patient verbalized understanding and appreciation.  She shares that this is the plan that she wants but that she does not want to make any changes at this time until she is spoken with her niece and her brother.  She asked if and when she would be able to return to [name].  I discussed that if patient is stable for transfer, we can work on having her transfer back to [name].  Additionally, a hospice agency would need to be engaged so that hospice services can be initiated at her facility.  Shared this would not happen this evening and that we would work towards that as the discharge plan tomorrow with TOC.  Patient agreement for me to speak with her niece and her brother.  After meeting with the patient, I spoke with patient's niece Marylene Land over the phone.  Marylene Land deferred any decisions to patient's brother Loraine Leriche but was appreciative of my phone call.  After speaking with Marylene Land, I spoke with Loraine Leriche over the phone.  I conveyed above discussion with patient, highlighting patient's wishes are to not receive aggressive measures but to focus on her comfort.  Loraine Leriche was in agreement.  I shared patient is awaiting discussion with Marylene Land and Loraine Leriche in order to officially shift to comfort measures.  Loraine Leriche shares he plans to return bedside this evening with Marylene Land to discuss patient's wishes.  No adjustment to plan of care at this time.  Dr. Alvester Morin and Dr. Sydnee Cabal made aware of patient's wishes to shift to comfort once she has discussed with her family.  DNR with limited interventions remains.  PMT will continue to follow and support patient throughout her hospitalization. Education offered regarding concept specific to human mortality and the limitations of medical interventions to prolong life when the body begins to fail to thrive.  Family is facing treatment option  decisions, advanced directive, and anticipatory care needs.    Discussed with patient/family the importance of continued conversation with family and the medical providers regarding overall plan of care and treatment options, ensuring decisions are within the context of the patient's values and GOCs.    Hospice and Palliative Care services outpatient were explained and offered.  Questions and concerns were addressed. The family was encouraged to call with questions or concerns.   Primary Decision Maker PATIENT  Physical Exam Constitutional:      General: She is not in acute distress.    Appearance: She is normal weight. She is ill-appearing.  HENT:     Head: Normocephalic.     Mouth/Throat:     Mouth: Mucous membranes are moist.  Eyes:     Pupils: Pupils are equal, round, and reactive to light.  Cardiovascular:     Rate and Rhythm: Normal rate.  Pulmonary:  Comments: Increased WOB Abdominal:     Palpations: Abdomen is soft.  Musculoskeletal:     Comments: MAETC, generalized weakness  Skin:    General: Skin is warm and dry.     Coloration: Skin is pale.  Neurological:     Mental Status: She is alert and oriented to person, place, and time.  Psychiatric:        Mood and Affect: Mood normal.        Behavior: Behavior normal.        Thought Content: Thought content normal.        Judgment: Judgment normal.     Palliative Assessment/Data: 30%     Thank you for this consult. Palliative medicine will continue to follow and assist holistically.   Time Total: 75 minutes  Time spent includes: Detailed review of medical records (labs, imaging, vital signs), medically appropriate exam (mental status, respiratory, cardiac, skin), discussed with treatment team, counseling and educating patient, family and staff, documenting clinical information, medication management and coordination of care.  Signed by: Georgiann Cocker, DNP, FNP-BC Palliative Medicine   Please contact  Palliative Medicine Team providers via [facility] for questions and concerns.

## 2024-03-05 NOTE — Assessment & Plan Note (Addendum)
 BP stable  Titrate home regimen  Monitor w/ diuresis

## 2024-03-05 NOTE — Assessment & Plan Note (Signed)
 Recent admission 01/2024 for hip fracture  S/p FIXATION, FRACTURE, INTERTROCHANTERIC, WITH INTRAMEDULLARY ROD (Right) on 3/17  Stable  Monitor

## 2024-03-05 NOTE — Assessment & Plan Note (Addendum)
 Acute on chronic HFpEF Decompensated respiratory failure requiring BiPAP in the setting of chronic HFpEF chest x-ray with cardiomegaly and edematous pattern 2D echo March 2025 with a EF of 70-75% BNP 1264 Some question of possible pneumonia chest x-ray.  However, white count within normal limits.  Procalcitonin less than 0.10-less likely IV Lasix 2D echo D dimer pending  Strict ins and outs Cardiology consultation Follow-up recommendations

## 2024-03-05 NOTE — Assessment & Plan Note (Signed)
 Recently evaluated for possible COPD exacerbation status post course of steroids and antibiotics Noted concurrent acute respiratory failure with hypoxia No active wheezing at present Will continue with home inhalers Monitor Formally reassess if respiratory symptoms persist despite diuresis

## 2024-03-05 NOTE — Progress Notes (Signed)
 CODE SEPSIS - PHARMACY COMMUNICATION  **Broad Spectrum Antibiotics should be administered within 1 hour of Sepsis diagnosis**  Time Code Sepsis Called/Page Received: 0804  Antibiotics Ordered: Ceftriaxone & Z-max  Time of 1st antibiotic administration: 0946  Additional action taken by pharmacy: Alternate pharmacist had contacted RN at 0902 re: administering abx  Tressie Ellis 03/05/2024  9:57 AM

## 2024-03-06 ENCOUNTER — Encounter: Payer: Self-pay | Admitting: Family Medicine

## 2024-03-06 DIAGNOSIS — J9601 Acute respiratory failure with hypoxia: Secondary | ICD-10-CM

## 2024-03-06 DIAGNOSIS — X58XXXD Exposure to other specified factors, subsequent encounter: Secondary | ICD-10-CM | POA: Diagnosis present

## 2024-03-06 DIAGNOSIS — Z808 Family history of malignant neoplasm of other organs or systems: Secondary | ICD-10-CM | POA: Diagnosis not present

## 2024-03-06 DIAGNOSIS — Z800 Family history of malignant neoplasm of digestive organs: Secondary | ICD-10-CM | POA: Diagnosis not present

## 2024-03-06 DIAGNOSIS — S72141D Displaced intertrochanteric fracture of right femur, subsequent encounter for closed fracture with routine healing: Secondary | ICD-10-CM | POA: Diagnosis not present

## 2024-03-06 DIAGNOSIS — Z825 Family history of asthma and other chronic lower respiratory diseases: Secondary | ICD-10-CM | POA: Diagnosis not present

## 2024-03-06 DIAGNOSIS — J4489 Other specified chronic obstructive pulmonary disease: Secondary | ICD-10-CM | POA: Diagnosis present

## 2024-03-06 DIAGNOSIS — G20A1 Parkinson's disease without dyskinesia, without mention of fluctuations: Secondary | ICD-10-CM | POA: Diagnosis present

## 2024-03-06 DIAGNOSIS — I5033 Acute on chronic diastolic (congestive) heart failure: Secondary | ICD-10-CM | POA: Diagnosis present

## 2024-03-06 DIAGNOSIS — Z7901 Long term (current) use of anticoagulants: Secondary | ICD-10-CM | POA: Diagnosis not present

## 2024-03-06 DIAGNOSIS — I110 Hypertensive heart disease with heart failure: Secondary | ICD-10-CM | POA: Diagnosis present

## 2024-03-06 DIAGNOSIS — I4891 Unspecified atrial fibrillation: Secondary | ICD-10-CM | POA: Diagnosis present

## 2024-03-06 DIAGNOSIS — Z803 Family history of malignant neoplasm of breast: Secondary | ICD-10-CM | POA: Diagnosis not present

## 2024-03-06 DIAGNOSIS — Z9012 Acquired absence of left breast and nipple: Secondary | ICD-10-CM | POA: Diagnosis not present

## 2024-03-06 DIAGNOSIS — I2510 Atherosclerotic heart disease of native coronary artery without angina pectoris: Secondary | ICD-10-CM | POA: Diagnosis present

## 2024-03-06 DIAGNOSIS — N179 Acute kidney failure, unspecified: Secondary | ICD-10-CM | POA: Diagnosis present

## 2024-03-06 DIAGNOSIS — Z515 Encounter for palliative care: Secondary | ICD-10-CM | POA: Diagnosis not present

## 2024-03-06 DIAGNOSIS — J189 Pneumonia, unspecified organism: Secondary | ICD-10-CM | POA: Diagnosis present

## 2024-03-06 DIAGNOSIS — A419 Sepsis, unspecified organism: Secondary | ICD-10-CM | POA: Diagnosis present

## 2024-03-06 DIAGNOSIS — Z66 Do not resuscitate: Secondary | ICD-10-CM | POA: Diagnosis present

## 2024-03-06 DIAGNOSIS — Z8262 Family history of osteoporosis: Secondary | ICD-10-CM | POA: Diagnosis not present

## 2024-03-06 DIAGNOSIS — Z801 Family history of malignant neoplasm of trachea, bronchus and lung: Secondary | ICD-10-CM | POA: Diagnosis not present

## 2024-03-06 DIAGNOSIS — Z823 Family history of stroke: Secondary | ICD-10-CM | POA: Diagnosis not present

## 2024-03-06 DIAGNOSIS — Z820 Family history of epilepsy and other diseases of the nervous system: Secondary | ICD-10-CM | POA: Diagnosis not present

## 2024-03-06 DIAGNOSIS — M810 Age-related osteoporosis without current pathological fracture: Secondary | ICD-10-CM | POA: Diagnosis present

## 2024-03-06 MED ORDER — SCOPOLAMINE 1 MG/3DAYS TD PT72
1.0000 | MEDICATED_PATCH | TRANSDERMAL | Status: DC
  Filled 2024-03-06: qty 1

## 2024-03-06 MED ORDER — FENTANYL CITRATE PF 50 MCG/ML IJ SOSY
12.5000 ug | PREFILLED_SYRINGE | INTRAMUSCULAR | Status: DC | PRN
  Administered 2024-03-06: 12.5 ug via INTRAVENOUS
  Filled 2024-03-06: qty 1

## 2024-03-10 LAB — CULTURE, BLOOD (ROUTINE X 2)
Culture: NO GROWTH
Culture: NO GROWTH
Specimen Description: ADEQUATE

## 2024-03-15 LAB — BLOOD GAS, VENOUS
Acid-base deficit: 0.8 mmol/L (ref 0.0–2.0)
Bicarbonate: 26.2 mmol/L (ref 20.0–28.0)
O2 Saturation: 51.9 %
Patient temperature: 37
pCO2, Ven: 52 mmHg (ref 44–60)
pH, Ven: 7.31 (ref 7.25–7.43)

## 2024-03-17 ENCOUNTER — Ambulatory Visit: Admitting: Medical

## 2024-03-26 NOTE — ED Notes (Signed)
 Patient reports feeling jittery after morphine. Some improvement in respiratory effort noted after morphine. Patient requesting medication to help with jitters.

## 2024-03-26 NOTE — ED Notes (Signed)
 Patient repositioned with additional pillow directly under buttocks per her request.

## 2024-03-26 NOTE — ED Notes (Signed)
 Patient repositioned with pillows under left side.

## 2024-03-26 NOTE — ED Notes (Signed)
 Patient appears uncomfortable, denies any pain medication or repositioning at this time.

## 2024-03-26 NOTE — Death Summary Note (Signed)
 DEATH SUMMARY   Patient Details  Name: Crystal Bass MRN: [id number] DOB: March 15, 1941 WUJ:[id number], Crystal Spar, MD Admission/Discharge Information   Admit Date:  03/09/2024  Date of Death:  03-10-2024  Time of Death:  09:08 AM  Length of Stay: 0   Principle Cause of death: hypoxic respiratory failure secondary to congestive heart failure  Hospital Diagnoses: Active Problems:   Closed right hip fracture, initial encounter [facility])   Acute respiratory failure with hypoxia [facility])   Atrial fibrillation s/p cardioversion 08/2023 [facility])   Essential hypertension   COPD (chronic obstructive pulmonary disease) (HCC)   Parkinson disease [facility])   Hospital Course: Patient presents from skilled nursing with shortness of breath and increased work of breathing. BNP elevated and CXR showing pneumonia vs pulmonary edema. Treatment was initiated with lasix and antibiotics and corticosteroids and patient was placed on bipap. Palliative consulted, patient elected to transition to full comfort care the evening of hospital day 0. She passed the following    Procedures: none  Consultations: palliative  The results of significant diagnostics from this hospitalization (including imaging, microbiology, ancillary and laboratory) are listed below for reference.   Significant Diagnostic Studies: DG Chest Port 1 View Result Date: Mar 09, 2024 CLINICAL DATA:  Sepsis EXAM: PORTABLE CHEST 1 VIEW COMPARISON:  Chest x-ray performed February 09, 2024 FINDINGS: Ill-defined airspace opacity is present in the right midlung zone. Heart is enlarged. Central interstitial airspace opacities are present. Limited evaluation of the retrocardiac region. IMPRESSION: 1. Ill-defined airspace opacity in the right midlung zone. Leading differential is pneumonia given the provided clinical data. Edema is also a consideration. 2. Enlarged heart. Electronically Signed   By: Lowell Guitar M.D.   On: 2024-03-09 08:43   DG HIP UNILAT WITH PELVIS  2-3 VIEWS RIGHT Result Date: 02/10/2024 CLINICAL DATA:  Intraoperative fluoroscopy 4 dynamic hip screw, ORIF EXAM: Intraoperative fluoroscopy COMPARISON:  Preop x-ray 02/09/2024. FINDINGS: Four fluoroscopic spot images submitted for review demonstrate placement dynamic hip screw transfixing the proximal intratrochanteric fracture. Long intramedullary rod. Imaging was obtained to aid in treatment. Expected alignment. Please correlate with real-time fluoroscopy of 1 minute and 13 seconds. Cumulative dose 10.09 mGy IMPRESSION: Intraoperative fluoroscopy Electronically Signed   By: Karen Kays M.D.   On: 02/10/2024 15:38   DG C-Arm 1-60 Min-No Report Result Date: 02/10/2024 Fluoroscopy was utilized by the requesting physician.  No radiographic interpretation.   DG C-Arm 1-60 Min-No Report Result Date: 02/10/2024 Fluoroscopy was utilized by the requesting physician.  No radiographic interpretation.   ECHOCARDIOGRAM COMPLETE Result Date: 02/10/2024    ECHOCARDIOGRAM REPORT   Patient Name:   Crystal Bass Date of Exam: 02/10/2024 Medical Rec #:  [id number]         Height:       62.0 in Accession #:    [id number]        Weight:       125.7 lb Date of Birth:  [birth year]         BSA:          1.569 m Patient Age:    82 years          BP:           119/59 mmHg Patient Gender: F                 HR:           61 bpm. Exam Location:  [facility] Procedure: 2D Echo, Cardiac Doppler and Color Doppler (Both Spectral and Color  Flow Doppler were utilized during procedure). Indications:     Mitral valve disorder I05.9  History:         Patient has prior history of Echocardiogram examinations, most                  recent 09/05/2023. COPD; Risk Factors:Hypertension. History of                  mitral valve repair.                   Mitral Valve: Mitra-Clip valve is present in the mitral                  position.  Sonographer:     Cristela Blue Referring Phys:  [id number] CADENCE H FURTH Diagnosing Phys: Lorine Bears MD  IMPRESSIONS  1. Left ventricular ejection fraction, by estimation, is 70 to 75%. The left ventricle has hyperdynamic function. The left ventricle has no regional wall motion abnormalities. There is moderate asymmetric left ventricular hypertrophy of the septal segment. Left ventricular diastolic parameters are indeterminate.  2. Right ventricular systolic function is normal. The right ventricular size is normal. There is moderately elevated pulmonary artery systolic pressure.  3. Left atrial size was moderately dilated.  4. The mitral valve has been repaired/replaced. Moderate mitral valve regurgitation. Mild mitral stenosis. The mean mitral valve gradient is 6.0 mmHg. There is a Mitra-Clip present in the mitral position.  5. The aortic valve is normal in structure. Aortic valve regurgitation is not visualized. No aortic stenosis is present.  6. Moderately dilated pulmonary artery.  7. The inferior vena cava is normal in size with greater than 50% respiratory variability, suggesting right atrial pressure of 3 mmHg. FINDINGS  Left Ventricle: Left ventricular ejection fraction, by estimation, is 70 to 75%. The left ventricle has hyperdynamic function. The left ventricle has no regional wall motion abnormalities. The left ventricular internal cavity size was normal in size. There is moderate asymmetric left ventricular hypertrophy of the septal segment. Left ventricular diastolic parameters are indeterminate. Right Ventricle: The right ventricular size is normal. No increase in right ventricular wall thickness. Right ventricular systolic function is normal. There is moderately elevated pulmonary artery systolic pressure. The tricuspid regurgitant velocity is 3.34 m/s, and with an assumed right atrial pressure of 3 mmHg, the estimated right ventricular systolic pressure is 47.6 mmHg. Left Atrium: Left atrial size was moderately dilated. Right Atrium: Right atrial size was normal in size. Pericardium: There is no  evidence of pericardial effusion. Mitral Valve: The mitral valve has been repaired/replaced. Moderate mitral valve regurgitation. There is a Mitra-Clip present in the mitral position. Mild mitral valve stenosis. MV peak gradient, 13.1 mmHg. The mean mitral valve gradient is 6.0 mmHg. Tricuspid Valve: The tricuspid valve is normal in structure. Tricuspid valve regurgitation is mild . No evidence of tricuspid stenosis. Aortic Valve: The aortic valve is normal in structure. Aortic valve regurgitation is not visualized. No aortic stenosis is present. Aortic valve mean gradient measures 3.0 mmHg. Aortic valve peak gradient measures 5.2 mmHg. Aortic valve area, by VTI measures 4.39 cm. Pulmonic Valve: The pulmonic valve was normal in structure. Pulmonic valve regurgitation is trivial. No evidence of pulmonic stenosis. Aorta: The aortic root is normal in size and structure. Pulmonary Artery: The pulmonary artery is moderately dilated. Venous: The inferior vena cava is normal in size with greater than 50% respiratory variability, suggesting right atrial pressure of 3 mmHg. IAS/Shunts: No atrial level shunt  detected by color flow Doppler.  LEFT VENTRICLE PLAX 2D LVIDd:         5.20 cm   Diastology LVIDs:         2.40 cm   LV e' medial:    4.79 cm/s LV PW:         1.10 cm   LV E/e' medial:  34.0 LV IVS:        1.70 cm   LV e' lateral:   6.64 cm/s LVOT diam:     2.55 cm   LV E/e' lateral: 24.5 LV SV:         76 LV SV Index:   48 LVOT Area:     5.11 cm  RIGHT VENTRICLE RV Basal diam:  2.50 cm RV Mid diam:    3.20 cm RV S prime:     11.30 cm/s TAPSE (M-mode): 1.6 cm LEFT ATRIUM             Index        RIGHT ATRIUM           Index LA diam:        4.40 cm 2.80 cm/m   RA Area:     13.90 cm LA Vol (A2C):   61.1 ml 38.94 ml/m  RA Volume:   36.10 ml  23.01 ml/m LA Vol (A4C):   64.8 ml 41.30 ml/m LA Biplane Vol: 64.3 ml 40.98 ml/m  AORTIC VALVE AV Area (Vmax):    3.36 cm AV Area (Vmean):   3.85 cm AV Area (VTI):     4.39 cm  AV Vmax:           114.00 cm/s AV Vmean:          72.400 cm/s AV VTI:            0.172 m AV Peak Grad:      5.2 mmHg AV Mean Grad:      3.0 mmHg LVOT Vmax:         75.00 cm/s LVOT Vmean:        54.600 cm/s LVOT VTI:          0.148 m LVOT/AV VTI ratio: 0.86  AORTA Ao Root diam: 3.30 cm MITRAL VALVE                TRICUSPID VALVE MV Area (PHT): 1.65 cm     TR Peak grad:   44.6 mmHg MV Area VTI:   1.24 cm     TR Vmax:        334.00 cm/s MV Peak grad:  13.1 mmHg MV Mean grad:  6.0 mmHg     SHUNTS MV Vmax:       1.81 m/s     Systemic VTI:  0.15 m MV Vmean:      111.0 cm/s   Systemic Diam: 2.55 cm MV Decel Time: 460 msec MV E velocity: 163.00 cm/s MV A velocity: 88.60 cm/s MV E/A ratio:  1.84 Lorine Bears MD Electronically signed by Lorine Bears MD Signature Date/Time: 02/10/2024/9:07:50 AM    Final    DG Chest 1 View Result Date: 02/09/2024 CLINICAL DATA:  Larey Seat, pain EXAM: CHEST  1 VIEW COMPARISON:  09/04/2023 FINDINGS: Single frontal view of the chest demonstrates stable enlargement of the cardiac silhouette. Postsurgical changes are seen from mitral valve repair. There is increased consolidation within the medial right upper lung zone since prior study, consistent with airspace disease superimposed upon chronic atelectasis and bronchiectasis seen on prior  CTs. Increased pulmonary vascular congestion. No effusion or pneumothorax. No acute bony abnormalities. IMPRESSION: 1. Enlarged cardiac silhouette. 2. Pulmonary vascular congestion. Increased consolidation within the medial right upper lung zone may reflect airspace disease superimposed upon known chronic scarring and bronchiectasis. Electronically Signed   By: Sharlet Salina M.D.   On: 02/09/2024 17:44   DG Hip Unilat W or Wo Pelvis 2-3 Views Right Result Date: 02/09/2024 CLINICAL DATA:  Right hip pain EXAM: DG HIP (WITH OR WITHOUT PELVIS) 2-3V RIGHT COMPARISON:  09/22/2022 FINDINGS: Acute, mildly comminuted intertrochanteric fracture of the proximal right  femur with mild displacement and varus angulation. Hip joint alignment is maintained without dislocation. Prior left hip arthroplasty. Bony pelvis intact without evidence of fracture or diastasis. Soft tissue swelling overlies the right hip laterally. IMPRESSION: Acute, mildly comminuted intertrochanteric fracture of the proximal right femur. Electronically Signed   By: Duanne Guess D.O.   On: 02/09/2024 17:43   DG Wrist Complete Right Result Date: 02/09/2024 CLINICAL DATA:  Larey Seat, right wrist pain EXAM: RIGHT WRIST - COMPLETE 3+ VIEW COMPARISON:  None Available. FINDINGS: Frontal, oblique, lateral, and ulnar deviated views of the right wrist are obtained. There is an impacted extra-articular fracture through the distal right radial metaphysis, with mild impaction at the fracture site. Anatomic alignment of the radiocarpal joint. Prominent osteoarthritis within the radial aspect of the carpus and at the first carpometacarpal joint. Mild soft tissue swelling. IMPRESSION: 1. Impacted extra-articular fracture of the distal radial metaphysis, with near anatomic alignment. 2. 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 3. Soft tissue swelling. Electronically Signed   By: Sharlet Salina M.D.   On: 02/09/2024 17:41    Microbiology: Recent Results (from the past 240 hours)  Resp panel by RT-PCR (RSV, Flu A&B, Covid) Anterior Nasal Swab     Status: None   Collection Time: 03/05/24  8:16 AM   Specimen: Anterior Nasal Swab  Result Value Ref Range Status   SARS Coronavirus 2 by RT PCR NEGATIVE NEGATIVE Final    Comment: (NOTE) SARS-CoV-2 target nucleic acids are NOT DETECTED.  The SARS-CoV-2 RNA is generally detectable in upper respiratory specimens during the acute phase of infection. The lowest concentration of SARS-CoV-2 viral copies this assay can detect is 138 copies/mL. A negative result does not preclude SARS-Cov-2 infection and should not be  used as the sole basis for treatment or other patient management decisions. A negative result may occur with  improper specimen collection/handling, submission of specimen other than nasopharyngeal swab, presence of viral mutation(s) within the areas targeted by this assay, and inadequate number of viral copies(<138 copies/mL). A negative result must be combined with clinical observations, patient history, and epidemiological information. The expected result is Negative.  Fact Sheet for Patients:  BloggerCourse.com  Fact Sheet for Healthcare Providers:  SeriousBroker.it  This test is no t yet approved or cleared by the [place] FDA and  has been authorized for detection and/or diagnosis of SARS-CoV-2 by FDA under an Emergency Use Authorization (EUA). This EUA will remain  in effect (meaning this test can be used) for the duration of the COVID-19 declaration under Section 564(b)(1) of the Act, 21 U.S.C.section 360bbb-3(b)(1), unless the authorization is terminated  or revoked sooner.       Influenza A by PCR NEGATIVE NEGATIVE Final   Influenza B by PCR NEGATIVE NEGATIVE Final    Comment: (NOTE) The Xpert Xpress SARS-CoV-2/FLU/RSV plus assay is intended as an aid in the  diagnosis of influenza from Nasopharyngeal swab specimens and should not be used as a sole basis for treatment. Nasal washings and aspirates are unacceptable for Xpert Xpress SARS-CoV-2/FLU/RSV testing.  Fact Sheet for Patients: BloggerCourse.com  Fact Sheet for Healthcare Providers: SeriousBroker.it  This test is not yet approved or cleared by the [place] FDA and has been authorized for detection and/or diagnosis of SARS-CoV-2 by FDA under an Emergency Use Authorization (EUA). This EUA will remain in effect (meaning this test can be used) for the duration of the COVID-19 declaration under Section  564(b)(1) of the Act, 21 U.S.C. section 360bbb-3(b)(1), unless the authorization is terminated or revoked.     Resp Syncytial Virus by PCR NEGATIVE NEGATIVE Final    Comment: (NOTE) Fact Sheet for Patients: BloggerCourse.com  Fact Sheet for Healthcare Providers: SeriousBroker.it  This test is not yet approved or cleared by the [place] FDA and has been authorized for detection and/or diagnosis of SARS-CoV-2 by FDA under an Emergency Use Authorization (EUA). This EUA will remain in effect (meaning this test can be used) for the duration of the COVID-19 declaration under Section 564(b)(1) of the Act, 21 U.S.C. section 360bbb-3(b)(1), unless the authorization is terminated or revoked.  Performed at [facility], [address] [place], [place] [address]   Blood Culture (routine x 2)     Status: None (Preliminary result)   Collection Time: 03/05/24  8:16 AM   Specimen: BLOOD  Result Value Ref Range Status   Specimen Description BLOOD Blood Culture adequate volume  Final   Special Requests   Final    BOTTLES DRAWN AEROBIC AND ANAEROBIC BLOOD RIGHT HAND   Culture   Final    NO GROWTH < 24 HOURS Performed at [facility], [address], [place], [place] [address]    Report Status PENDING  Incomplete  Blood Culture (routine x 2)     Status: None (Preliminary result)   Collection Time: 03/05/24  8:17 AM   Specimen: BLOOD  Result Value Ref Range Status   Specimen Description   Final    BLOOD Blood Culture results may not be optimal due to an inadequate volume of blood received in culture bottles   Special Requests   Final    BOTTLES DRAWN AEROBIC AND ANAEROBIC BLOOD RIGHT ARM   Culture   Final    NO GROWTH < 24 HOURS Performed at [facility], [address], [place], [place] [address]    Report Status PENDING  Incomplete    Time spent: 35 minutes  Signed: Silvano Bilis, MD 02/26/2024

## 2024-03-26 NOTE — Progress Notes (Signed)
 [facility] called and spoke to Edgar Frisk, he will call brother Loraine Leriche now. I gave Onalee Hua all the info he needed and they will come pick up Crystal Bass shortly. Transport has already been called to pick up body from ED bed 36 and transport her to the morgue.

## 2024-03-26 NOTE — ED Notes (Signed)
 Patient assisted with brushing teeth.

## 2024-03-26 DEATH — deceased

## 2024-04-13 ENCOUNTER — Other Ambulatory Visit (HOSPITAL_COMMUNITY): Payer: Medicare PPO

## 2024-04-13 ENCOUNTER — Ambulatory Visit: Payer: Medicare PPO | Admitting: Physician Assistant

## 2024-05-27 ENCOUNTER — Ambulatory Visit: Payer: Medicare PPO | Admitting: Student

## 2024-08-06 ENCOUNTER — Ambulatory Visit: Admitting: Neurology
